# Patient Record
Sex: Male | Born: 1965 | Race: Black or African American | Hispanic: No | Marital: Married | Smoking: Never smoker
Health system: Southern US, Community
[De-identification: ages and names within clinical notes are randomized; demographics above are authoritative.]

## PROBLEM LIST (undated history)

## (undated) DIAGNOSIS — F32A Depression, unspecified: Secondary | ICD-10-CM

## (undated) DIAGNOSIS — K449 Diaphragmatic hernia without obstruction or gangrene: Secondary | ICD-10-CM

## (undated) DIAGNOSIS — Z8639 Personal history of other endocrine, nutritional and metabolic disease: Secondary | ICD-10-CM

## (undated) DIAGNOSIS — R399 Unspecified symptoms and signs involving the genitourinary system: Secondary | ICD-10-CM

## (undated) DIAGNOSIS — J45909 Unspecified asthma, uncomplicated: Secondary | ICD-10-CM

## (undated) DIAGNOSIS — F329 Major depressive disorder, single episode, unspecified: Secondary | ICD-10-CM

## (undated) DIAGNOSIS — Z8659 Personal history of other mental and behavioral disorders: Secondary | ICD-10-CM

## (undated) DIAGNOSIS — K219 Gastro-esophageal reflux disease without esophagitis: Secondary | ICD-10-CM

## (undated) DIAGNOSIS — F341 Dysthymic disorder: Secondary | ICD-10-CM

## (undated) DIAGNOSIS — M1990 Unspecified osteoarthritis, unspecified site: Secondary | ICD-10-CM

## (undated) DIAGNOSIS — E890 Postprocedural hypothyroidism: Secondary | ICD-10-CM

## (undated) DIAGNOSIS — M542 Cervicalgia: Secondary | ICD-10-CM

## (undated) DIAGNOSIS — N529 Male erectile dysfunction, unspecified: Secondary | ICD-10-CM

## (undated) DIAGNOSIS — D696 Thrombocytopenia, unspecified: Secondary | ICD-10-CM

## (undated) DIAGNOSIS — I7092 Chronic total occlusion of artery of the extremities: Secondary | ICD-10-CM

## (undated) DIAGNOSIS — R21 Rash and other nonspecific skin eruption: Secondary | ICD-10-CM

## (undated) DIAGNOSIS — J309 Allergic rhinitis, unspecified: Secondary | ICD-10-CM

## (undated) DIAGNOSIS — D6959 Other secondary thrombocytopenia: Secondary | ICD-10-CM

## (undated) DIAGNOSIS — N200 Calculus of kidney: Secondary | ICD-10-CM

## (undated) DIAGNOSIS — Z9189 Other specified personal risk factors, not elsewhere classified: Secondary | ICD-10-CM

## (undated) DIAGNOSIS — I252 Old myocardial infarction: Secondary | ICD-10-CM

## (undated) DIAGNOSIS — R112 Nausea with vomiting, unspecified: Secondary | ICD-10-CM

## (undated) DIAGNOSIS — Z87440 Personal history of urinary (tract) infections: Secondary | ICD-10-CM

## (undated) DIAGNOSIS — Z8619 Personal history of other infectious and parasitic diseases: Secondary | ICD-10-CM

## (undated) DIAGNOSIS — E119 Type 2 diabetes mellitus without complications: Secondary | ICD-10-CM

## (undated) DIAGNOSIS — G8929 Other chronic pain: Secondary | ICD-10-CM

## (undated) DIAGNOSIS — R6521 Severe sepsis with septic shock: Secondary | ICD-10-CM

## (undated) DIAGNOSIS — T451X5A Adverse effect of antineoplastic and immunosuppressive drugs, initial encounter: Secondary | ICD-10-CM

## (undated) DIAGNOSIS — Z9889 Other specified postprocedural states: Secondary | ICD-10-CM

## (undated) DIAGNOSIS — Z8719 Personal history of other diseases of the digestive system: Secondary | ICD-10-CM

## (undated) DIAGNOSIS — F419 Anxiety disorder, unspecified: Secondary | ICD-10-CM

## (undated) DIAGNOSIS — N201 Calculus of ureter: Secondary | ICD-10-CM

## (undated) DIAGNOSIS — R110 Nausea: Secondary | ICD-10-CM

## (undated) DIAGNOSIS — A419 Sepsis, unspecified organism: Secondary | ICD-10-CM

## (undated) DIAGNOSIS — M549 Dorsalgia, unspecified: Secondary | ICD-10-CM

## (undated) DIAGNOSIS — E785 Hyperlipidemia, unspecified: Secondary | ICD-10-CM

## (undated) DIAGNOSIS — Z794 Long term (current) use of insulin: Secondary | ICD-10-CM

## (undated) DIAGNOSIS — I739 Peripheral vascular disease, unspecified: Secondary | ICD-10-CM

## (undated) DIAGNOSIS — Z87442 Personal history of urinary calculi: Secondary | ICD-10-CM

## (undated) DIAGNOSIS — I10 Essential (primary) hypertension: Secondary | ICD-10-CM

## (undated) DIAGNOSIS — G479 Sleep disorder, unspecified: Secondary | ICD-10-CM

## (undated) HISTORY — PX: BACK SURGERY: SHX140

## (undated) HISTORY — DX: Postprocedural hypothyroidism: E89.0

## (undated) HISTORY — DX: Peripheral vascular disease, unspecified: I73.9

## (undated) HISTORY — DX: Sepsis, unspecified organism: A41.9

## (undated) HISTORY — PX: POPLITEAL ARTERY ANGIOPLASTY: SHX2242

## (undated) HISTORY — DX: Male erectile dysfunction, unspecified: N52.9

## (undated) HISTORY — DX: Unspecified asthma, uncomplicated: J45.909

## (undated) HISTORY — PX: POSTERIOR LUMBAR FUSION: SHX6036

## (undated) HISTORY — DX: Allergic rhinitis, unspecified: J30.9

## (undated) HISTORY — DX: Sleep disorder, unspecified: G47.9

## (undated) HISTORY — DX: Dysthymic disorder: F34.1

## (undated) HISTORY — DX: Severe sepsis with septic shock: R65.21

---
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
## 2004-03-08 ENCOUNTER — Ambulatory Visit (HOSPITAL_BASED_OUTPATIENT_CLINIC_OR_DEPARTMENT_OTHER): Admission: RE | Admit: 2004-03-08 | Discharge: 2004-03-08 | Payer: Self-pay | Admitting: Orthopedic Surgery

## 2004-03-08 HISTORY — PX: SHOULDER ARTHROSCOPY: SHX128

## 2009-03-04 ENCOUNTER — Encounter: Admission: RE | Admit: 2009-03-04 | Discharge: 2009-03-04 | Payer: Self-pay | Admitting: Neurosurgery

## 2009-03-26 ENCOUNTER — Ambulatory Visit (HOSPITAL_COMMUNITY): Admission: RE | Admit: 2009-03-26 | Discharge: 2009-03-27 | Payer: Self-pay | Admitting: Neurosurgery

## 2009-03-26 HISTORY — PX: LAMINECTOMY AND MICRODISCECTOMY LUMBAR SPINE: SHX1913

## 2009-06-12 ENCOUNTER — Encounter: Admission: RE | Admit: 2009-06-12 | Discharge: 2009-06-12 | Payer: Self-pay | Admitting: Neurosurgery

## 2009-11-11 ENCOUNTER — Inpatient Hospital Stay (HOSPITAL_COMMUNITY): Admission: RE | Admit: 2009-11-11 | Discharge: 2009-11-14 | Payer: Self-pay | Admitting: Neurosurgery

## 2010-02-18 ENCOUNTER — Ambulatory Visit (HOSPITAL_COMMUNITY): Admission: RE | Admit: 2010-02-18 | Discharge: 2010-02-18 | Payer: Self-pay | Admitting: Neurosurgery

## 2010-03-04 ENCOUNTER — Encounter: Payer: Self-pay | Admitting: Endocrinology

## 2010-03-16 ENCOUNTER — Ambulatory Visit: Payer: Self-pay | Admitting: Endocrinology

## 2010-03-16 DIAGNOSIS — R4182 Altered mental status, unspecified: Secondary | ICD-10-CM | POA: Insufficient documentation

## 2010-03-16 DIAGNOSIS — I10 Essential (primary) hypertension: Secondary | ICD-10-CM | POA: Insufficient documentation

## 2010-03-16 LAB — CONVERTED CEMR LAB
Free T4: 1.57 ng/dL (ref 0.60–1.60)
TSH: 0.04 microintl units/mL — ABNORMAL LOW (ref 0.35–5.50)

## 2010-03-25 ENCOUNTER — Telehealth: Payer: Self-pay | Admitting: Endocrinology

## 2010-04-19 ENCOUNTER — Ambulatory Visit: Payer: Self-pay | Admitting: Endocrinology

## 2010-04-19 LAB — CONVERTED CEMR LAB
Free T4: 1.06 ng/dL (ref 0.60–1.60)
TSH: 0.05 microintl units/mL — ABNORMAL LOW (ref 0.35–5.50)

## 2010-05-18 ENCOUNTER — Encounter: Admission: RE | Admit: 2010-05-18 | Payer: Self-pay | Source: Home / Self Care | Admitting: Neurosurgery

## 2010-06-10 ENCOUNTER — Encounter
Admission: RE | Admit: 2010-06-10 | Discharge: 2010-06-10 | Payer: Self-pay | Source: Home / Self Care | Attending: Neurosurgery | Admitting: Neurosurgery

## 2010-06-17 NOTE — Assessment & Plan Note (Signed)
Summary: NEW ENDO CONSULT / HYPOTHYROID/ [place] ACCESS/NWS  /#   Vital Signs:  Patient profile:   45 year old male Height:      70 inches (177.80 cm) Weight:      221.13 pounds (100.51 kg) BMI:     31.84 O2 Sat:      99 % on Room air Temp:     97.8 degrees F (36.56 degrees C) oral Pulse rate:   78 / minute BP sitting:   122 / 74  (left arm) Cuff size:   large  Vitals Entered By: Brenton Grills CMA Duncan Dull) (March 16, 2010 4:13 PM)  O2 Flow:  Room air CC: New Endo/Hypothyroid/aj Is Patient Diabetic? No   Referring Provider:  Brent Bulla MD Primary Provider:  Brent Bulla MD  CC:  New Endo/Hypothyroid/aj.  History of Present Illness: pt was hospitalized approx 3 weeks ago for confusion, and was found to be severely hyperthyroid.  he was rx'ed tapazole, and he feels better now.   he says in retrospect, he has 1 year of slight palpitaions in the chest, but no assoc sob.   Current Medications (verified): 1)  Lactulose 20 Gm/46ml Soln (Lactulose) .... 30 Cc's Two Times A Day 2)  Metoprolol Tartrate 100 Mg Tabs (Metoprolol Tartrate) .Marland Kitchen.. 1 Tablet By Mouth Two Times A Day 3)  Methimazole 5 Mg Tabs (Methimazole) .Marland Kitchen.. 1 Tablet By Mouth Three Times A Day 4)  Magnesium Oxide 400 Mg Tabs (Magnesium Oxide) .Marland Kitchen.. 1 By Mouth Once Daily 5)  Potassium Gluconate 595 Mg Tabs (Potassium Gluconate) .Marland Kitchen.. 1 Tablet By Mouth Once Daily 6)  Klonopin 1 Mg Tabs (Clonazepam) .Marland Kitchen.. 1 Tablet By Mouth Two Times A Day 7)  Omeprazole 40 Mg Cpdr (Omeprazole) .Marland Kitchen.. 1 By Mouth Once Daily 8)  Paxil 20 Mg Tabs (Paroxetine Hcl) .Marland Kitchen.. 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 Family History of Arthritis Family History Diabetes 1st degree relative (Grandparents) Family History Hypertension Family History of Heart Disease (Parents, Grandparents) mother has hypothyroidism  Social History: Reviewed history and no changes required. 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  he has lost 20 lbs x 1 year, but has since regained a few of these.  he has hoarseness.  he has a "sensation" in the throat.  he attributes diarrhea to the lactulose.  hje has a few myalgias.  he reports no change in chronic excessive diaphoresis.  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  he has chronic anxiety.   Physical Exam  General:  normal appearance.   Head:  head: no deformity eyes: no periorbital swelling.  there is slight bilateral proptosis  external nose and ears are normal mouth: no lesion seen Neck:  Supple without thyroid enlargement or tenderness. there is little or no thyromegaly. Lungs:  Clear to auscultation bilaterally. Normal respiratory effort.  Heart:  Regular rate and rhythm without murmurs or gallops noted. Normal S1,S2.   Abdomen:  abdomen  is soft, nontender.  no hepatosplenomegaly.   not distended.  no hernia.  Msk:  muscle bulk and strength are grossly normal.  no obvious joint swelling.  gait is normal and steady  Pulses:  dorsalis pedis intact bilat.  no carotid bruit  Extremities:  no deformity.  no ulcer on the feet.  feet are of normal color and temp.  no edema  Neurologic:  cn 2-12 grossly intact.   readily moves all 4's.   sensation is intact to touch on the feet  Skin:  normal texture and temp.  no rash.  not diaphoretic.  Cervical  Nodes:  No significant adenopathy.  Psych:  Alert and cooperative; normal mood and affect; normal attention span and concentration.   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 as it is not related to his thyroid problem.  Problem # 3:  throat "sensation" in view of his lack of a large goiter, this should be assumed to be of non-thyroidal origin.  Medications Added to Medication List This Visit: 1)  Lactulose 20 Gm/38ml Soln (Lactulose) .... 30 cc's two times a day 2)  Metoprolol Tartrate 100 Mg Tabs (Metoprolol tartrate) .Marland Kitchen.. 1 tablet by mouth two times a day 3)  Methimazole 5 Mg Tabs (Methimazole) .Marland Kitchen.. 1 tablet by mouth three times a day 4)  Magnesium Oxide 400 Mg Tabs (Magnesium oxide) .Marland Kitchen.. 1 by mouth once daily 5)  Potassium Gluconate 595 Mg Tabs (Potassium gluconate) .Marland Kitchen.. 1 tablet by mouth once daily 6)  Klonopin 1 Mg Tabs (Clonazepam) .Marland Kitchen.. 1 tablet by mouth two times a day 7)  Omeprazole 40 Mg Cpdr (Omeprazole) .Marland Kitchen.. 1 by mouth once daily 8)  Paxil 20 Mg Tabs (Paroxetine hcl) .Marland Kitchen.. 1 tablet by mouth once daily 9)  Methimazole 10 Mg Tabs (Methimazole) .Marland Kitchen.. 1 tab two times a day 10)  Metoprolol Tartrate 50 Mg Tabs (Metoprolol tartrate) .Marland Kitchen.. 1 tab two times a day  Other Orders: TLB-TSH (Thyroid Stimulating Hormone) (84443-TSH) TLB-T4 (Thyrox), Free [phone]) New Patient Level IV (29518)  Patient Instructions: 1)  blood tests are being ordered for you today.  please call [phone] to hear your test results. 2)  if ever you have fever while taking methimazole, stop it and call us, because of the risk  of a rare side-effect 3)  Please tentatively plan to return for a follow-up appointment in 1 month. 4)  (update:  i left message on phone tree.  increase tapazole to 10 mg two times a day.  reduce metoprolol to 50 mg bid). Prescriptions: METOPROLOL TARTRATE 50 MG TABS (METOPROLOL TARTRATE) 1 tab two times a day  #60 x 3   Entered and Authorized by:   Minus Breeding MD   Signed by:   Minus Breeding MD on 03/17/2010   Method used:   Electronically to  [facility]* (retail)       [address] [place], [place]  [id number]       Ph: [phone]       Fax: [phone]   RxID:   [id number] METHIMAZOLE 10 MG TABS (METHIMAZOLE) 1 tab two times a day  #60 x 2   Entered and Authorized by:   Minus Breeding MD   Signed by:   Minus Breeding MD on 03/17/2010   Method used:   Electronically to        [facility]* (retail)       [address] [place], [place]  [id number]       Ph: [phone]       Fax: [phone]   RxID:   [id number]    Orders Added: 1)  TLB-TSH (Thyroid Stimulating Hormone) [84443-TSH] 2)  TLB-T4 Elna Breslow), Free [[id number]] 3)  New Patient Level IV [phone]

## 2010-06-17 NOTE — Assessment & Plan Note (Signed)
Summary: 1 MO ROV /NWS  #   Vital Signs:  Patient profile:   45 year old male Height:      70 inches (177.80 cm) Weight:      231.25 pounds (105.11 kg) BMI:     33.30 O2 Sat:      98 % on Room air Temp:     98.0 degrees F (36.67 degrees C) oral Pulse rate:   84 / minute BP sitting:   122 / 78  (left arm) Cuff size:   large  Vitals Entered By: Brenton Grills CMA Duncan Dull) (April 19, 2010 7:59 AM)  O2 Flow:  Room air CC: Follow-up visit/aj Is Patient Diabetic? No   Referring Provider:  Brent Bulla MD Primary Provider:  Brent Bulla MD  CC:  Follow-up visit/aj.  History of Present Illness: pt says he takes meds as rx'ed.  palpitations are improved.  pt states he feels better in general.  Current Medications (verified): 1)  Lactulose 20 Gm/2ml Soln (Lactulose) .... 30 Cc's Two Times A Day 2)  Magnesium Oxide 400 Mg Tabs (Magnesium Oxide) .Marland Kitchen.. 1 By Mouth Once Daily 3)  Potassium Gluconate 595 Mg Tabs (Potassium Gluconate) .Marland Kitchen.. 1 Tablet By Mouth Once Daily 4)  Klonopin 1 Mg Tabs (Clonazepam) .Marland Kitchen.. 1 Tablet By Mouth Two Times A Day 5)  Omeprazole 40 Mg Cpdr (Omeprazole) .Marland Kitchen.. 1 By Mouth Once Daily 6)  Paxil 20 Mg Tabs (Paroxetine Hcl) .Marland Kitchen.. 1 Tablet By Mouth Once Daily 7)  Methimazole 10 Mg Tabs (Methimazole) .Marland Kitchen.. 1 Tab Two Times A Day 8)  Metoprolol Tartrate 100 Mg Tabs (Metoprolol Tartrate) .Marland Kitchen.. 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    Physical Exam  General:  normal appearance.   Neck:  there is little or no thyromegaly. Lungs:  Clear to auscultation bilaterally. Normal respiratory effort.  Heart:  Regular rate and rhythm without murmurs or gallops  noted. Normal S1,S2.   Additional Exam:  FastTSH              [L]  0.05 uIU/mL                 0.35-5.50 Free T4                   1.06 ng/dL       Impression & Recommendations:  Problem # 1:  HYPERTHYROIDISM (ICD-242.90) Assessment Improved  Other Orders: TLB-TSH (Thyroid Stimulating Hormone) (84443-TSH) TLB-T4 (Thyrox), Free [phone]) Est. Patient Level III (44010)  Patient Instructions: 1)  blood tests are being ordered for you today.  please call [phone] to hear your test results. 2)  Please schedule a follow-up appointment in 2 months. 3)  (update: i left message on phone-tree:  same rx).   Orders Added: 1)  TLB-TSH (Thyroid Stimulating Hormone) [84443-TSH] 2)  TLB-T4 (Thyrox), Free [[id number]] 3)  Est. Patient Level III [95638]

## 2010-06-17 NOTE — Letter (Signed)
Summary: [facility]   Imported By: Lennie Odor 03/18/2010 14:02:45  _____________________________________________________________________  External Attachment:    Type:   Image     Comment:   External Document

## 2010-06-17 NOTE — Progress Notes (Signed)
Summary: increased HR  Phone Note Call from Patient   Caller: Patient [phone] Summary of Call: Pt's spouse called stating that since pt has change medications he has been experiencing increased HR 90's, Pt is requesting to go back to original medciation and dosage, please advise Initial call taken by: Margaret Pyle, CMA,  March 25, 2010 4:14 PM  Follow-up for Phone Call        ok i sent rx Follow-up by: Minus Breeding MD,  March 25, 2010 4:34 PM  Additional Follow-up for Phone Call Additional follow up Details #1::        Pt informed  Additional Follow-up by: Lamar Sprinkles, CMA,  March 25, 2010 5:48 PM    New/Updated Medications: METOPROLOL TARTRATE 100 MG TABS (METOPROLOL TARTRATE) 1 tab two times a day Prescriptions: METOPROLOL TARTRATE 100 MG TABS (METOPROLOL TARTRATE) 1 tab two times a day  #60 x 1   Entered and Authorized by:   Minus Breeding MD   Signed by:   Minus Breeding MD on 03/25/2010   Method used:   Electronically to        [facility]* (retail)       [address], [place]  [id number]       Ph: [phone]       Fax: [phone]   RxID:   [id number]

## 2010-06-18 ENCOUNTER — Other Ambulatory Visit: Payer: Self-pay | Admitting: Endocrinology

## 2010-06-18 ENCOUNTER — Ambulatory Visit (INDEPENDENT_AMBULATORY_CARE_PROVIDER_SITE_OTHER): Payer: Medicaid Other | Admitting: Endocrinology

## 2010-06-18 ENCOUNTER — Encounter: Payer: Self-pay | Admitting: Endocrinology

## 2010-06-18 ENCOUNTER — Encounter (INDEPENDENT_AMBULATORY_CARE_PROVIDER_SITE_OTHER): Payer: Self-pay | Admitting: *Deleted

## 2010-06-18 ENCOUNTER — Other Ambulatory Visit: Payer: Medicaid Other

## 2010-06-18 ENCOUNTER — Ambulatory Visit: Admit: 2010-06-18 | Payer: Self-pay | Admitting: Endocrinology

## 2010-06-18 DIAGNOSIS — E0590 Thyrotoxicosis, unspecified without thyrotoxic crisis or storm: Secondary | ICD-10-CM

## 2010-06-18 LAB — T4, FREE: Free T4: 0.5 ng/dL — ABNORMAL LOW (ref 0.60–1.60)

## 2010-06-18 LAB — TSH: TSH: 0.03 u[IU]/mL — ABNORMAL LOW (ref 0.35–5.50)

## 2010-06-24 ENCOUNTER — Other Ambulatory Visit: Payer: Self-pay | Admitting: Endocrinology

## 2010-06-24 DIAGNOSIS — E0590 Thyrotoxicosis, unspecified without thyrotoxic crisis or storm: Secondary | ICD-10-CM

## 2010-06-29 ENCOUNTER — Telehealth (INDEPENDENT_AMBULATORY_CARE_PROVIDER_SITE_OTHER): Payer: Self-pay | Admitting: *Deleted

## 2010-07-01 NOTE — Assessment & Plan Note (Signed)
Summary: FU---STC   Vital Signs:  Patient profile:   45 year old male Height:      70 inches (177.80 cm) Weight:      239.13 pounds (108.70 kg) BMI:     34.44 O2 Sat:      97 % on Room air Temp:     98.1 degrees F (36.72 degrees C) oral Pulse rate:   63 / minute Pulse rhythm:   regular BP sitting:   120 / 82  (left arm) Cuff size:   large  Vitals Entered By: Brenton Grills CMA (AAMA) (June 18, 2010 9:20 AM)  O2 Flow:  Room air CC: 2 month F/U/aj Is Patient Diabetic? Yes   Referring Provider:  Brent Bulla MD Primary Provider:  Brent Bulla MD  CC:  2 month F/U/aj.  History of Present Illness: pt says he takes meds as rx'ed.  palpitations are improved.  pt states he feels well in general.  he now wants to pursue i-131 rx.  Current Medications (verified): 1)  Lactulose 20 Gm/84ml Soln (Lactulose) .... 30 Cc's Two Times A Day 2)  Magnesium Oxide 400 Mg Tabs (Magnesium Oxide) .Marland Kitchen.. 1 By Mouth Once Daily 3)  Potassium Gluconate 595 Mg Tabs (Potassium Gluconate) .Marland Kitchen.. 1 Tablet By Mouth Once Daily 4)  Klonopin 1 Mg Tabs (Clonazepam) .Marland Kitchen.. 1 Tablet By Mouth Two Times A Day 5)  Omeprazole 40 Mg Cpdr (Omeprazole) .Marland Kitchen.. 1 By Mouth Once Daily 6)  Paxil 20 Mg Tabs (Paroxetine Hcl) .Marland Kitchen.. 1 Tablet By Mouth Once Daily 7)  Methimazole 10 Mg Tabs (Methimazole) .Marland Kitchen.. 1 Tab Two Times A Day 8)  Metoprolol Tartrate 100 Mg Tabs (Metoprolol Tartrate) .Marland Kitchen.. 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    Physical Exam  General:  normal appearance.   Neck:  there is little or no thyromegaly. Neurologic:  no tremor Skin:  normal texture and temp.  no rash.  not  diaphoretic.  Additional Exam:  FastTSH              [L]  0.03 uIU/mL                 0.35-5.50 Free T4              [L]  0.50 ng/dL            Impression & Recommendations:  Problem # 1:  HYPERTHYROIDISM (ICD-242.90) pt now wants to pursue i-131 rx  Other Orders: i-131 Thyroid uptake and scan (Capsule & Scan) Nuc Med Diagnostic (thyroid uptake & sca) TLB-TSH (Thyroid Stimulating Hormone) (84443-TSH) TLB-T4 (Thyrox), Free ([id number]-UE4V) Est. Patient Level III (40981)  Patient Instructions: 1)  blood tests are being ordered for you today.  please call [phone] to hear your test results. 2)  pending the test results, please stop the methimazole. 3)  the tentative plan would be to do a scan, followed by radioactrive iodine therapy, then resume methimazole 3 days later, then return here 1 month after that.   4)  continue the same metoprolol for now.  5)  (update: i left message on phone-tree:  rx as we discussed)   Orders Added: 1)  i-131 Thyroid uptake and scan (Capsule & Scan) Nuc Med Diagnostic [thyroid uptake &  sca] 2)  TLB-TSH (Thyroid Stimulating Hormone) [84443-TSH] 3)  TLB-T4 (Thyrox), Free [[id number]] 4)  Est. Patient Level III [44010]

## 2010-07-07 NOTE — Progress Notes (Signed)
  Phone Note Other Incoming   Request: Send information Summary of Call: Request for records received from [facility]. Request forwarded to [employer].  02/13/2010 to present.

## 2010-07-12 ENCOUNTER — Encounter (HOSPITAL_COMMUNITY)
Admission: RE | Admit: 2010-07-12 | Discharge: 2010-07-12 | Disposition: A | Discharge status: Home / Self Care | Payer: Medicaid Other | Source: Ambulatory Visit | Attending: Endocrinology | Admitting: Endocrinology

## 2010-07-12 DIAGNOSIS — E0590 Thyrotoxicosis, unspecified without thyrotoxic crisis or storm: Secondary | ICD-10-CM

## 2010-07-12 DIAGNOSIS — I10 Essential (primary) hypertension: Secondary | ICD-10-CM | POA: Insufficient documentation

## 2010-07-12 DIAGNOSIS — E0500 Thyrotoxicosis with diffuse goiter without thyrotoxic crisis or storm: Secondary | ICD-10-CM | POA: Insufficient documentation

## 2010-07-13 ENCOUNTER — Ambulatory Visit (HOSPITAL_COMMUNITY)
Admission: RE | Admit: 2010-07-13 | Discharge: 2010-07-13 | Disposition: A | Discharge status: Home / Self Care | Payer: Medicaid Other | Source: Ambulatory Visit | Attending: Endocrinology | Admitting: Endocrinology

## 2010-07-13 DIAGNOSIS — E0590 Thyrotoxicosis, unspecified without thyrotoxic crisis or storm: Secondary | ICD-10-CM | POA: Insufficient documentation

## 2010-07-13 MED ORDER — SODIUM IODIDE I 131 CAPSULE
5.0000 | Freq: Once | INTRAVENOUS | Status: AC | PRN
  Administered 2010-07-12: 5 via ORAL

## 2010-07-13 MED ORDER — SODIUM PERTECHNETATE TC 99M INJECTION
10.0000 | Freq: Once | INTRAVENOUS | Status: AC | PRN
  Administered 2010-07-13: 10 via INTRAVENOUS

## 2010-07-14 ENCOUNTER — Telehealth: Payer: Self-pay | Admitting: Endocrinology

## 2010-07-14 ENCOUNTER — Other Ambulatory Visit: Payer: Self-pay | Admitting: Endocrinology

## 2010-07-14 DIAGNOSIS — E079 Disorder of thyroid, unspecified: Secondary | ICD-10-CM

## 2010-07-16 ENCOUNTER — Encounter (HOSPITAL_COMMUNITY): Payer: Self-pay

## 2010-07-16 ENCOUNTER — Encounter (HOSPITAL_COMMUNITY)
Admission: RE | Admit: 2010-07-16 | Discharge: 2010-07-16 | Disposition: A | Discharge status: Home / Self Care | Payer: Medicaid Other | Source: Ambulatory Visit | Attending: Endocrinology | Admitting: Endocrinology

## 2010-07-16 DIAGNOSIS — E079 Disorder of thyroid, unspecified: Secondary | ICD-10-CM

## 2010-07-16 MED ORDER — SODIUM IODIDE I 131 CAPSULE
18.4000 | Freq: Once | INTRAVENOUS | Status: AC | PRN
  Administered 2010-07-16: 18.4 via ORAL

## 2010-07-19 ENCOUNTER — Other Ambulatory Visit (HOSPITAL_COMMUNITY): Payer: Self-pay | Admitting: Neurosurgery

## 2010-07-19 DIAGNOSIS — M542 Cervicalgia: Secondary | ICD-10-CM

## 2010-07-22 NOTE — Progress Notes (Signed)
Summary: Surgery?  Phone Note Call from Patient Call back at [facility] Phone [phone]   Caller: Patient Summary of Call: Pt called stating he is scheduled for I-131 treatment but he also needs to have neck (disc) surgery. Pt is requesting MD advise on long should he wait to have neck surgery after I-131 treatment? Initial call taken by: Margaret Pyle, CMA,  July 14, 2010 3:48 PM  Follow-up for Phone Call        if possible, you should wait until your thyroid level is back to normal.  the radioactive iodine would take several months to do this.  while the radioactive iodine is working, i can prescribe medication for you to normalize the blood tests in 1 month or so, if you wish. Follow-up by: Minus Breeding MD,  July 15, 2010 9:01 AM  Additional Follow-up for Phone Call Additional follow up Details #1::        Pt advised of above and will discuss Rx at follow up appt after procedure Additional Follow-up by: Margaret Pyle, CMA,  July 15, 2010 1:34 PM

## 2010-07-30 ENCOUNTER — Ambulatory Visit (HOSPITAL_COMMUNITY)
Admission: RE | Admit: 2010-07-30 | Discharge: 2010-07-30 | Disposition: A | Discharge status: Home / Self Care | Payer: Medicaid Other | Source: Ambulatory Visit | Attending: Neurosurgery | Admitting: Neurosurgery

## 2010-07-30 ENCOUNTER — Encounter: Payer: Self-pay | Admitting: Endocrinology

## 2010-07-30 ENCOUNTER — Ambulatory Visit (INDEPENDENT_AMBULATORY_CARE_PROVIDER_SITE_OTHER): Payer: Medicaid Other | Admitting: Endocrinology

## 2010-07-30 DIAGNOSIS — M542 Cervicalgia: Secondary | ICD-10-CM

## 2010-07-30 DIAGNOSIS — M4802 Spinal stenosis, cervical region: Secondary | ICD-10-CM | POA: Insufficient documentation

## 2010-07-30 DIAGNOSIS — E0590 Thyrotoxicosis, unspecified without thyrotoxic crisis or storm: Secondary | ICD-10-CM

## 2010-07-30 MED ORDER — IOHEXOL 300 MG/ML  SOLN
10.0000 mL | Freq: Once | INTRAMUSCULAR | Status: AC | PRN
  Administered 2010-07-30: 10 mL via INTRATHECAL

## 2010-08-01 LAB — CBC
HCT: 36.2 % — ABNORMAL LOW (ref 39.0–52.0)
HCT: 44.8 % (ref 39.0–52.0)
Hemoglobin: 12 g/dL — ABNORMAL LOW (ref 13.0–17.0)
Hemoglobin: 14.8 g/dL (ref 13.0–17.0)
MCH: 27.1 pg (ref 26.0–34.0)
MCH: 27.1 pg (ref 26.0–34.0)
MCHC: 33 g/dL (ref 30.0–36.0)
MCHC: 33.1 g/dL (ref 30.0–36.0)
MCV: 81.9 fL (ref 78.0–100.0)
MCV: 81.9 fL (ref 78.0–100.0)
Platelets: 131 10*3/uL — ABNORMAL LOW (ref 150–400)
Platelets: 171 10*3/uL (ref 150–400)
RBC: 4.42 MIL/uL (ref 4.22–5.81)
RBC: 5.47 MIL/uL (ref 4.22–5.81)
RDW: 12.8 % (ref 11.5–15.5)
RDW: 12.9 % (ref 11.5–15.5)
WBC: 12.1 10*3/uL — ABNORMAL HIGH (ref 4.0–10.5)
WBC: 7.8 10*3/uL (ref 4.0–10.5)

## 2010-08-01 LAB — SURGICAL PCR SCREEN
MRSA, PCR: NEGATIVE
Staphylococcus aureus: NEGATIVE

## 2010-08-01 LAB — BASIC METABOLIC PANEL
BUN: 11 mg/dL (ref 6–23)
BUN: 9 mg/dL (ref 6–23)
CO2: 24 mEq/L (ref 19–32)
CO2: 29 mEq/L (ref 19–32)
Calcium: 8.6 mg/dL (ref 8.4–10.5)
Calcium: 9.3 mg/dL (ref 8.4–10.5)
Chloride: 106 mEq/L (ref 96–112)
Chloride: 99 mEq/L (ref 96–112)
Creatinine, Ser: 0.74 mg/dL (ref 0.4–1.5)
Creatinine, Ser: 0.96 mg/dL (ref 0.4–1.5)
GFR calc Af Amer: >60 mL/min (ref >60)
GFR calc Af Amer: >60 mL/min (ref >60)
GFR calc non Af Amer: >60 mL/min (ref >60)
GFR calc non Af Amer: >60 mL/min (ref >60)
Glucose, Bld: 103 mg/dL — ABNORMAL HIGH (ref 70–99)
Glucose, Bld: 123 mg/dL — ABNORMAL HIGH (ref 70–99)
Potassium: 4.1 mEq/L (ref 3.5–5.1)
Potassium: 4.3 mEq/L (ref 3.5–5.1)
Sodium: 134 mEq/L — ABNORMAL LOW (ref 135–145)
Sodium: 138 mEq/L (ref 135–145)

## 2010-08-01 LAB — CARDIAC PANEL(CRET KIN+CKTOT+MB+TROPI)
CK, MB: 0.8 ng/mL (ref 0.3–4.0)
CK, MB: 1 ng/mL (ref 0.3–4.0)
CK, MB: 1.1 ng/mL (ref 0.3–4.0)
Relative Index: 0.1 (ref 0.0–2.5)
Relative Index: 0.1 (ref 0.0–2.5)
Relative Index: 0.1 (ref 0.0–2.5)
Total CK: 808 U/L — ABNORMAL HIGH (ref 7–232)
Total CK: 1024 U/L — ABNORMAL HIGH (ref 7–232)
Total CK: 1065 U/L — ABNORMAL HIGH (ref 7–232)
Troponin I: 0.06 ng/mL (ref 0.00–0.06)
Troponin I: 0.05 ng/mL (ref 0.00–0.06)
Troponin I: 0.05 ng/mL (ref 0.00–0.06)

## 2010-08-01 LAB — TYPE AND SCREEN
ABO/RH(D): O POS
Antibody Screen: NEGATIVE

## 2010-08-01 LAB — ABO/RH: ABO/RH(D): O POS

## 2010-08-02 ENCOUNTER — Ambulatory Visit: Payer: Medicaid Other | Attending: Neurosurgery | Admitting: Physical Therapy

## 2010-08-03 NOTE — Assessment & Plan Note (Signed)
Summary: THROAT IS SORE AFTER IODINE AND DIZZY /NWS   Vital Signs:  Patient profile:   45 year old male Height:      70 inches (177.80 cm) Weight:      236.13 pounds (107.33 kg) BMI:     34.00 O2 Sat:      99 % on Room air Temp:     97.6 degrees F (36.44 degrees C) oral Pulse rate:   95 / minute BP sitting:   106 / 70  (left arm) Cuff size:   large  Vitals Entered By: Brenton Grills CMA Duncan Dull) (July 30, 2010 11:20 AM)  O2 Flow:  Room air CC: Sore throat after iodine tx and dizziness x 1 week/aj Is Patient Diabetic? No   Referring Provider:  Brent Bulla MD Primary Provider:  Brent Bulla MD  CC:  Sore throat after iodine tx and dizziness x 1 week/aj.  History of Present Illness: pt had i-131 rx for hyperthyroidism, approx 3 weeks ago.  scan was inconclusive between grave's dz and multinodular goiter.  he now has 1 week of heat intolerance, sore throat, and dizziness.  Current Medications (verified): 1)  Lactulose 20 Gm/74ml Soln (Lactulose) .... 30 Cc's Two Times A Day 2)  Magnesium Oxide 400 Mg Tabs (Magnesium Oxide) .Marland Kitchen.. 1 By Mouth Once Daily 3)  Potassium Gluconate 595 Mg Tabs (Potassium Gluconate) .Marland Kitchen.. 1 Tablet By Mouth Once Daily 4)  Klonopin 1 Mg Tabs (Clonazepam) .Marland Kitchen.. 1 Tablet By Mouth Two Times A Day 5)  Omeprazole 40 Mg Cpdr (Omeprazole) .Marland Kitchen.. 1 By Mouth Once Daily 6)  Paxil 20 Mg Tabs (Paroxetine Hcl) .Marland Kitchen.. 1 Tablet By Mouth Once Daily 7)  Metoprolol Tartrate 100 Mg Tabs (Metoprolol Tartrate) .Marland Kitchen.. 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 Family History of Arthritis Family History Diabetes 1st degree relative  (Grandparents) Family History Hypertension Family History of Heart Disease (Parents, Grandparents) mother has hypothyroidism  Social History: Reviewed history from 03/16/2010 and no changes required. Married Never Smoked Alcohol use-yes Regular exercise-no disabled due to back problem  Review of Systems  The patient denies syncope and prolonged cough.         fever is resolved.  no n/v/d  Physical Exam  General:  normal appearance.   Neck:  exam is limited by low-lying thyroid, but it is approx 3x normal size, with a multinodlar texture.     Impression & Recommendations:  Problem # 1:  HYPERTHYROIDISM (ICD-242.90) some sxs may be attributable to this, but he may have viral illness also.  Other Orders: Est. Patient Level III (16109)  Patient Instructions: 1)  here is a refill of your clonazepam. 2)  you should rest until your symptoms are impoved 3)  please return as scheduled in 1 month. 4)  drink plenty of fluids. Prescriptions: KLONOPIN 1 MG TABS (CLONAZEPAM) 1 tablet by mouth two times a day  #60 x 1   Entered and Authorized by:   Minus Breeding MD   Signed by:   Minus Breeding MD on 07/30/2010   Method used:   Print then Give to Patient   RxID:   [id number]    Orders Added: 1)  Est. Patient Level III [95621]

## 2010-08-18 LAB — BASIC METABOLIC PANEL
BUN: 7 mg/dL (ref 6–23)
CO2: 29 mEq/L (ref 19–32)
Calcium: 9.1 mg/dL (ref 8.4–10.5)
Chloride: 103 mEq/L (ref 96–112)
Creatinine, Ser: 0.81 mg/dL (ref 0.4–1.5)
GFR calc Af Amer: >60 mL/min (ref >60)
GFR calc non Af Amer: >60 mL/min (ref >60)
Glucose, Bld: 125 mg/dL — ABNORMAL HIGH (ref 70–99)
Potassium: 4.3 mEq/L (ref 3.5–5.1)
Sodium: 139 mEq/L (ref 135–145)

## 2010-08-18 LAB — CBC
HCT: 45.4 % (ref 39.0–52.0)
Hemoglobin: 15.2 g/dL (ref 13.0–17.0)
MCHC: 33.4 g/dL (ref 30.0–36.0)
MCV: 82.5 fL (ref 78.0–100.0)
Platelets: 153 10*3/uL (ref 150–400)
RBC: 5.51 MIL/uL (ref 4.22–5.81)
RDW: 12.9 % (ref 11.5–15.5)
WBC: 7.3 10*3/uL (ref 4.0–10.5)

## 2010-08-30 ENCOUNTER — Other Ambulatory Visit (INDEPENDENT_AMBULATORY_CARE_PROVIDER_SITE_OTHER): Payer: Medicaid Other

## 2010-08-30 ENCOUNTER — Ambulatory Visit (INDEPENDENT_AMBULATORY_CARE_PROVIDER_SITE_OTHER): Payer: Medicaid Other | Admitting: Endocrinology

## 2010-08-30 ENCOUNTER — Encounter: Payer: Self-pay | Admitting: Endocrinology

## 2010-08-30 VITALS — BP 128/82 | HR 66 | Temp 97.7°F | Ht 70.0 in | Wt 241.4 lb

## 2010-08-30 DIAGNOSIS — E0590 Thyrotoxicosis, unspecified without thyrotoxic crisis or storm: Secondary | ICD-10-CM

## 2010-08-30 DIAGNOSIS — R252 Cramp and spasm: Secondary | ICD-10-CM

## 2010-08-30 LAB — BASIC METABOLIC PANEL
BUN: 8 mg/dL (ref 6–23)
CO2: 30 mEq/L (ref 19–32)
Calcium: 8.9 mg/dL (ref 8.4–10.5)
Chloride: 102 mEq/L (ref 96–112)
Creatinine, Ser: 1.2 mg/dL (ref 0.4–1.5)
GFR: 88.96 mL/min (ref >60.00)
Glucose, Bld: 81 mg/dL (ref 70–99)
Potassium: 4.6 mEq/L (ref 3.5–5.1)
Sodium: 139 mEq/L (ref 135–145)

## 2010-08-30 LAB — T4, FREE: Free T4: 0.27 ng/dL — ABNORMAL LOW (ref 0.60–1.60)

## 2010-08-30 LAB — TSH: TSH: 0.27 u[IU]/mL — ABNORMAL LOW (ref 0.35–5.50)

## 2010-08-30 MED ORDER — LEVOTHYROXINE SODIUM 50 MCG PO TABS: 50.0000 ug | ORAL_TABLET | Freq: Every day | ORAL | Status: DC

## 2010-08-30 NOTE — Progress Notes (Signed)
  Subjective:    Patient ID: Jeff [name], male    DOB: 1966/05/09, 45 y.o.   MRN: [id number]  HPI Pt is now 6 weeks s/p i-131 rx for hyperthyroidism due to grave's dz vs multinodular goiter vs both.  pt states he feels well in general, except for a few mos of some intermittent mild cramps in the legs, and assoc mild dizziness.   Past Medical History  Diagnosis Date  . Thyroid dysfunction   . HYPERTENSION 03/16/2010  . HYPERTHYROIDISM 03/16/2010  . THYROID STORM 03/16/2010  . Altered mental status 03/16/2010   Past Surgical History  Procedure Date  . Back surgery 03/2009, 10/2009    reports that he has never smoked. He does not have any smokeless tobacco history on file. He reports that he drinks alcohol. His drug history not on file. family history includes Arthritis in his other; Diabetes in his other; Heart disease in his other; Hypertension in his other; and Thyroid disease in his mother. No Known Allergies  Review of Systems Denies fever.  He still has slight tremor of the hands.    Objective:   Physical Exam GENERAL: no distress Neck:  i do not appreciate a goiter. Neurol:  Little if any tremor is noted.       Assessment & Plan:  Post-i-131 hypothyroidism.  new Muscle cramps.  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  He wants to have surgery when he can.

## 2010-08-30 NOTE — Patient Instructions (Addendum)
blood tests are being ordered for you today.  please call [phone] to hear your test results. pending the test results, please continue the same medications for now Please make a follow-up appointment in 1 month.  i'll clear you for the neck surgery as soon as i can. i left message on phone tree.  Start synthroid 50/d.  You are cleared for surgery, from a thyroid standpoint.

## 2010-09-01 ENCOUNTER — Ambulatory Visit (HOSPITAL_COMMUNITY)
Admission: RE | Admit: 2010-09-01 | Discharge: 2010-09-01 | Disposition: A | Discharge status: Home / Self Care | Payer: Medicaid Other | Source: Ambulatory Visit | Attending: Neurosurgery | Admitting: Neurosurgery

## 2010-09-01 ENCOUNTER — Encounter (HOSPITAL_COMMUNITY)
Admission: RE | Admit: 2010-09-01 | Discharge: 2010-09-01 | Disposition: A | Discharge status: Home / Self Care | Payer: Medicaid Other | Source: Ambulatory Visit | Attending: Neurosurgery | Admitting: Neurosurgery

## 2010-09-01 ENCOUNTER — Other Ambulatory Visit (HOSPITAL_COMMUNITY): Payer: Self-pay | Admitting: Neurosurgery

## 2010-09-01 DIAGNOSIS — Z01818 Encounter for other preprocedural examination: Secondary | ICD-10-CM | POA: Insufficient documentation

## 2010-09-01 DIAGNOSIS — Z01810 Encounter for preprocedural cardiovascular examination: Secondary | ICD-10-CM | POA: Insufficient documentation

## 2010-09-01 DIAGNOSIS — Z01812 Encounter for preprocedural laboratory examination: Secondary | ICD-10-CM | POA: Insufficient documentation

## 2010-09-01 DIAGNOSIS — M479 Spondylosis, unspecified: Secondary | ICD-10-CM

## 2010-09-01 LAB — BASIC METABOLIC PANEL
BUN: 8 mg/dL (ref 6–23)
CO2: 27 mEq/L (ref 19–32)
Calcium: 8.7 mg/dL (ref 8.4–10.5)
Chloride: 104 mEq/L (ref 96–112)
Creatinine, Ser: 1.11 mg/dL (ref 0.4–1.5)
GFR calc Af Amer: >60 mL/min (ref >60)
GFR calc non Af Amer: >60 mL/min (ref >60)
Glucose, Bld: 105 mg/dL — ABNORMAL HIGH (ref 70–99)
Potassium: 4.5 mEq/L (ref 3.5–5.1)
Sodium: 135 mEq/L (ref 135–145)

## 2010-09-01 LAB — CBC
HCT: 47.2 % (ref 39.0–52.0)
Hemoglobin: 15.2 g/dL (ref 13.0–17.0)
MCH: 27.2 pg (ref 26.0–34.0)
MCHC: 32.2 g/dL (ref 30.0–36.0)
MCV: 84.4 fL (ref 78.0–100.0)
Platelets: 161 10*3/uL (ref 150–400)
RBC: 5.59 MIL/uL (ref 4.22–5.81)
RDW: 13.5 % (ref 11.5–15.5)
WBC: 7 10*3/uL (ref 4.0–10.5)

## 2010-09-01 LAB — SURGICAL PCR SCREEN
MRSA, PCR: NEGATIVE
Staphylococcus aureus: NEGATIVE

## 2010-09-06 ENCOUNTER — Other Ambulatory Visit: Payer: Self-pay | Admitting: Endocrinology

## 2010-09-08 ENCOUNTER — Inpatient Hospital Stay (HOSPITAL_COMMUNITY): Payer: Medicaid Other

## 2010-09-08 ENCOUNTER — Observation Stay (HOSPITAL_COMMUNITY)
Admission: RE | Admit: 2010-09-08 | Discharge: 2010-09-08 | DRG: 473 | Disposition: A | Discharge status: Home / Self Care | Payer: Medicaid Other | Source: Ambulatory Visit | Attending: Neurosurgery | Admitting: Neurosurgery

## 2010-09-08 DIAGNOSIS — E039 Hypothyroidism, unspecified: Secondary | ICD-10-CM | POA: Insufficient documentation

## 2010-09-08 DIAGNOSIS — M47812 Spondylosis without myelopathy or radiculopathy, cervical region: Secondary | ICD-10-CM | POA: Insufficient documentation

## 2010-09-08 DIAGNOSIS — M5020 Other cervical disc displacement, unspecified cervical region: Secondary | ICD-10-CM | POA: Insufficient documentation

## 2010-09-08 DIAGNOSIS — M5030 Other cervical disc degeneration, unspecified cervical region: Principal | ICD-10-CM | POA: Insufficient documentation

## 2010-09-08 DIAGNOSIS — K219 Gastro-esophageal reflux disease without esophagitis: Secondary | ICD-10-CM | POA: Insufficient documentation

## 2010-09-08 DIAGNOSIS — I10 Essential (primary) hypertension: Secondary | ICD-10-CM | POA: Insufficient documentation

## 2010-09-08 HISTORY — PX: ANTERIOR CERVICAL DECOMP/DISCECTOMY FUSION: SHX1161

## 2010-09-12 NOTE — Op Note (Signed)
NAME:  Jeff [name], Jeff [name] NO.:  [id number]  MEDICAL RECORD NO.:  [id number]           PATIENT TYPE:  I  LOCATION:  3526                         FACILITY:  [facility]  PHYSICIAN:  Cristi Loron, M.D.DATE OF BIRTH:  1966/04/22  DATE OF PROCEDURE:  09/08/2010 DATE OF DISCHARGE:  09/08/2010                              OPERATIVE REPORT   BRIEF HISTORY:  The patient is a 45 year old black male who has suffered from neck pain.  He has failed medical management.  He is worked up with a cervical MRI and mild CT, which demonstrated he had spondylosis, degeneration etc. at C3-4.  I discussed the various treatment options with the patient including surgery.  He has weighed the risks, benefits, and alternatives of the surgery and decided to proceed with a C3-4 anterior cervical diskectomy fusion and plating.  PREOPERATIVE DIAGNOSES:  C3-4 degenerative disk disease, herniated nucleus pulposus, spondylosis stenosis, cervicalgia, cervical radiculopathy.  POSTOPERATIVE DIAGNOSES:  C3-4 degenerative disk disease, herniated nucleus pulposus, spondylosis stenosis, cervicalgia, cervical radiculopathy.  PROCEDURE:  C3-4 extensive anterior cervical diskectomy/decompression; C3-4 anterior interbody arthrodesis with local morselized autograft bone and Actifuse bone graft extender; insertion of C3-4 interbody prosthesis ([employer] PEEK interbody prosthesis); anterior cervical plating with a Globus titanium plate and screws.  SURGEON:  Cristi Loron, MD  ASSISTANT:  Danae Orleans. Venetia Maxon, MD  ANESTHESIA:  General endotracheal.  ESTIMATED BLOOD LOSS:  50 mL.  SPECIMENS:  None.  DRAINS:  None.  COMPLICATIONS:  None.  DESCRIPTION OF PROCEDURE:  The patient was brought to the operating room by the Anesthesia team.  General endotracheal anesthesia was induced. The patient remained in the supine position.  A roll was placed under his shoulder to keep his neck in a neutral position.   His anterior cervical region was then prepared with Betadine scrub and Betadine solution.  Sterile drapes were applied.  I then injected the area to be incised with Marcaine with epinephrine solution.  A scalpel to make a transverse incision in the patient's left anterior neck.  I used a Metzenbaum scissors to divide the platysma muscle and then to dissect medial to sternocleidomastoid muscle, jugular vein and carotid artery. I carefully dissected down towards the anterior spine, identifying the esophagus and retracting it medially.  I then used the Kittner swabs to clear soft tissue from anterior cervical spine.  I inserted a bent spinal needle into the upper exposed intervertebral disk space.  We obtained intraoperative radiograph to confirm our location.  I then used electrocautery to detach the medial border of the longus colli muscle bilaterally from C3-4 intervertebral disk space.  I inserted the Caspar self-retaining retractor underneath the longus colli muscle bilaterally to provide exposure.  We began the decompression by incising the C3-4 intervertebral disk with a 15-blade scalpel.  We performed a partial intervertebral diskectomy using the pituitary forceps and Karlin curettes.  I then inserted the distraction screws into C4 and C4 and distracted the interspace.  I then used the high-speed drill to decorticate the vertebral endplates at C3-4 to drill away the remainder of C3-4 intervertebral disk, to drill away some posterior spondylosis and  to thinned out the posterior longitudinal ligament.  I then used the arachnoid knife to incise the ligament and then removed it with Kerrison punch undercutting the vertebral endplates at C3-4 decompressing the thecal sac.  I then performed foraminotomies about the bilateral C4 nerve roots completing the decompression at this level.  I now turned our attention to the arthrodesis.  I used trial spacers and determined to use a 7-mm  interbody prosthesis.  I prefilled the prosthesis with a combination of local autograft bone we obtained during the decompression as well as Actifuse bone graft extender.  I inserted the prosthesis and distracted at C3-4 interspaces and then removed the distraction screws.  There was a good snug fit of the prosthesis in the interspace.  We now turned our attention to the anterior spinal instrumentation.  We used high-speed drill to remove some ventral spondylosis from the C3-4 vertebral endplates, so that the plate would lie down flat.  We selected appropriate length Globus anterior cervical plate.  We laid it along the anterior aspect of the vertebral bodies at C3-4.  We then drilled two 40- mm holes at C3 to C4 and secured the plate to the vertebral bodies with two 40-mm self-tapping screws.  We got good bony purchase.  We then obtained intraoperative radiograph, which demonstrated good position of plate, screws, and bioprosthesis.  We therefore secured these screws and plate by locking each cam.  This completed the instrumentation.  We then obtained hemostasis using bipolar electrocautery.  We irrigated the wound out with bacitracin solution.  We then removed the retractor. We inspected esophagus for any damage, there was none apparent.  We then reapproximated the patient's platysma muscle with interrupted 3-0 Vicryl suture, subcutaneous tissue with interrupted 3-0 Vicryl suture, and the skin with Steri-Strips and benzoin.  The wound was then coated with bacitracin ointment.  A sterile dressing was applied.  The drapes were removed, and the patient was subsequently extubated by the Anesthesia team and transported to postanesthesia care unit in stable condition. All sponge, instrument, and needle counts were correct at the end of this case.     Cristi Loron, M.D.     JDJ/MEDQ  D:  09/08/2010  T:  09/09/2010  Job:  [id number]  Electronically Signed by Tressie Stalker M.D.  on 09/12/2010 11:07:27 AM

## 2010-09-29 ENCOUNTER — Ambulatory Visit (INDEPENDENT_AMBULATORY_CARE_PROVIDER_SITE_OTHER): Payer: Medicaid Other | Admitting: Endocrinology

## 2010-09-29 ENCOUNTER — Other Ambulatory Visit (INDEPENDENT_AMBULATORY_CARE_PROVIDER_SITE_OTHER): Payer: Medicaid Other

## 2010-09-29 ENCOUNTER — Encounter: Payer: Self-pay | Admitting: Endocrinology

## 2010-09-29 VITALS — BP 118/80 | HR 72 | Temp 98.5°F | Ht 70.0 in | Wt 245.0 lb

## 2010-09-29 DIAGNOSIS — E042 Nontoxic multinodular goiter: Secondary | ICD-10-CM | POA: Insufficient documentation

## 2010-09-29 LAB — TSH: TSH: 3.01 u[IU]/mL (ref 0.35–5.50)

## 2010-09-29 NOTE — Patient Instructions (Addendum)
blood tests are being ordered for you today.  please call [phone] to hear your test results.  You will be prompted to enter the 9-digit "MRN" number that appears at the top left of this page, followed by #.  Then you will hear the message. Please make a follow-up appointment in 6 weeks. Now that your thyroid is better, you no longer need clonazepam for it--good. Please ask "[employer]" to forward the disability exam form to a doctor who treats you for a condition which might cause disability. You are not disabled from the thyroid standpoint--good.    (update: i left message on phone-tree:  rx as we discussed)

## 2010-09-29 NOTE — Progress Notes (Signed)
  Subjective:    Patient ID: Jeff [name], male    DOB: 06-Feb-1966, 45 y.o.   MRN: [id number]  HPI Pt is now 2 1/2 months s/p i-131 rx for hyperthyroidism due to grave's dz vs multinodular goiter vs both.  He is now on synthroid 50/d.  pt states he feels well in general. Past Medical History  Diagnosis Date  . Thyroid dysfunction   . HYPERTENSION 03/16/2010  . HYPERTHYROIDISM 03/16/2010  . THYROID STORM 03/16/2010  . Altered mental status 03/16/2010    Past Surgical History  Procedure Date  . Back surgery 03/2009, 10/2009    History   Social History  . Marital Status: Married    Spouse Name: N/A    Number of Children: N/A  . Years of Education: N/A   Occupational History  . Disabled    Social History Main Topics  . Smoking status: Never Smoker   . Smokeless tobacco: Not on file  . Alcohol Use: Yes  . Drug Use:   . Sexually Active:    Other Topics Concern  . Not on file   Social History Narrative   Regular exercise-noDisabled to due back problem    Current Outpatient Prescriptions on File Prior to Visit  Medication Sig Dispense Refill  . Lactulose 20 GM/30ML SOLN Take by mouth. 30 cc's two times a day       . levothyroxine (SYNTHROID) 50 MCG tablet Take 1 tablet (50 mcg total) by mouth daily.  30 tablet  2  . magnesium oxide (MAG-OX) 400 MG tablet Take 400 mg by mouth daily.        . metoprolol (LOPRESSOR) 100 MG tablet TAKE 1 TABLET TWICE DAILY.  60 tablet  5  . omeprazole (PRILOSEC) 40 MG capsule Take 40 mg by mouth daily.        Marland Kitchen PARoxetine (PAXIL) 20 MG tablet Take 20 mg by mouth every morning.        . potassium gluconate 595 MG TABS Take 595 mg by mouth daily.         No Known Allergies  Family History  Problem Relation Age of Onset  . Thyroid disease Mother     hypothyroidism  . Arthritis Other   . Hypertension Other   . Heart disease Other     Parent, Grandparents  . Diabetes Other     Grandparents    BP 118/80  Pulse 72  Temp(Src) 98.5 F  (36.9 C) (Oral)  Ht 5\' 10"  (1.778 m)  Wt 245 lb (111.131 kg)  BMI 35.15 kg/m2  SpO2 99%     Review of Systems Denies weight change.      Objective:   Physical Exam GENERAL: no distress Neck: a healing scar is present (recent c-spine surgery).  i do not appreciate a nodule in the thyroid or elsewhere in the neck.     Lab Results  Component Value Date   TSH 3.01 09/29/2010     Assessment & Plan:  Hyperthyroidism, resolved with i-131 rx

## 2010-10-01 NOTE — Op Note (Signed)
NAMEBERNON, ARVISO NO.:  [id number]   MEDICAL RECORD NO.:  [id number]          PATIENT TYPE:  AMB   LOCATION:  DSC                          FACILITY:  [facility]   PHYSICIAN:  Feliberto Gottron. Turner Daniels, M.D.   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   OPERATION PERFORMED:  Right shoulder arthroscopic anterior inferior  acromioplasty, distal clavicle excision and debridement of labral tear.   SURGEON:  Feliberto Gottron. Turner Daniels, M.D.   ASSISTANTLaural Benes. Jannet Mantis.   ANESTHESIA:  General endotracheal.   ESTIMATED BLOOD LOSS:  Minimal.   FLUIDS REPLACED:  1500 mL of crystalloid.   DRAINS:  None.   TOURNIQUET TIME:  None.   INDICATIONS FOR PROCEDURE:  The patient is a 45 year old man injured at  work.  MRI proven rotator cuff tendonitis.  Got good temporary response to a  differential injection of the acromioclavicular joint, unfortunately, his  pain is returning.  He is now a candidate for an arthroscopic decompression  of the shoulder for a large type 2 subacromial spur as well as  acromioclavicular joint arthritis which was evident on the MRI scan.   DESCRIPTION OF PROCEDURE:  The patient was identified by arm band and taken  to the operating room at [facility] where appropriate  anesthetic monitors are attached and general endotracheal anesthesia induced  with the patient in the supine position.  The patient was placed in the  beach chair position.  The right upper extremity was prepped and draped in  the usual sterile fashion from the wrist to the hemithorax.  The skin along  the anterior, lateral and posterior aspects of  the acromion process was  infiltrated with 2 to 3 mL of 0.5% Marcaine with epinephrine solution into  each region and then using a #11 blade, standard portals were made 1.5 cm  anterior to the [facility] joint, lateral to the junction of the middle and  posterior thirds of the acromion and posterior to the posterolateral corner  of the acromion process.  The inflow was placed anteriorly, the arthroscope  laterally and a 4.2 Great [place] sucker shaver posteriorly as an Geophysicist/field seismologist  applied traction on the elbow opening the subacromial space which was then  debrided of inflamed bursa with a 4.2 Great [place] sucker shaver.  We  outlined the subacromial spur which was then removed with a 4.5 mm hooded  Vortex bur exposing the [facility] joint which was arthritic and the anterior distal  half of the clavicle removed at this point for about 1 cm.  We then swapped  portals bringing the arthroscope in posteriorly, the inflow laterally and  the vortex bur anteriorly completing our distal clavicle excision up to the  acromioclavicular ligament. Photographic documentation was made of the  distal clavicle  excision as well as the removal of the subacromial spur.  The arthroscope was then repositioned into glenohumeral joint using the  posterior portal.  We then identified a radial flap tear in the superior  labrum which was debrided back to a stable margin using the anterior portal  with a 4.2 Great [place] sucker shaver.  The inferior aspect of the rotator  cuff was intact.  Biceps tendon was intact.  Subscap was intact and the  articular cartilage has a little bit of grade 2  chondromalacia only at the  center of the glenoid and this was lightly debrided. At this point the  shoulder was irrigated out with normal saline solution.  The arthroscopic  instruments were removed and a dressing of Xeroform, 4 x 4 dressing sponges,  HypaFix tape and a sling applied.  Patient was laid supine, awakened and  taken to the recovery room  without difficulty.      Emilio Aspen  D:  03/08/2004  T:  03/08/2004  Job:  [id number]

## 2010-10-04 ENCOUNTER — Telehealth: Payer: Self-pay

## 2010-10-04 NOTE — Telephone Encounter (Signed)
Message copied by Margaret Pyle on Mon Oct 04, 2010  2:28 PM ------      Message from: Romero Belling      Created: Sat Oct 02, 2010  2:43 PM       Please fax note, and labs, if any, to pcp.  Thank you.

## 2010-10-04 NOTE — Telephone Encounter (Signed)
done

## 2010-11-10 ENCOUNTER — Other Ambulatory Visit (INDEPENDENT_AMBULATORY_CARE_PROVIDER_SITE_OTHER): Payer: Medicaid Other

## 2010-11-10 ENCOUNTER — Encounter: Payer: Self-pay | Admitting: Endocrinology

## 2010-11-10 ENCOUNTER — Ambulatory Visit (INDEPENDENT_AMBULATORY_CARE_PROVIDER_SITE_OTHER): Payer: Medicaid Other | Admitting: Endocrinology

## 2010-11-10 VITALS — BP 126/84 | HR 71 | Temp 97.9°F | Ht 70.0 in | Wt 251.6 lb

## 2010-11-10 DIAGNOSIS — E0590 Thyrotoxicosis, unspecified without thyrotoxic crisis or storm: Secondary | ICD-10-CM

## 2010-11-10 LAB — TSH: TSH: 9.25 u[IU]/mL — ABNORMAL HIGH (ref 0.35–5.50)

## 2010-11-10 MED ORDER — LEVOTHYROXINE SODIUM 75 MCG PO TABS: 75.0000 ug | ORAL_TABLET | Freq: Every day | ORAL | Status: DC

## 2010-11-10 NOTE — Progress Notes (Signed)
  Subjective:    Patient ID: Jeff [name], male    DOB: April 11, 1966, 45 y.o.   MRN: [id number]  HPI Pt is now almost 4 months s/p i-131 rx for hyperthyroidism due to grave's dz vs multinodular goiter vs both. He is now on synthroid 50/d. pt states he feels well in general.  Past Medical History  Diagnosis Date  . Thyroid dysfunction   . HYPERTENSION 03/16/2010  . HYPERTHYROIDISM 03/16/2010  . THYROID STORM 03/16/2010  . Altered mental status 03/16/2010    Past Surgical History  Procedure Date  . Back surgery 03/2009, 10/2009    History   Social History  . Marital Status: Married    Spouse Name: N/A    Number of Children: N/A  . Years of Education: N/A   Occupational History  . Disabled    Social History Main Topics  . Smoking status: Never Smoker   . Smokeless tobacco: Not on file  . Alcohol Use: Yes  . Drug Use:   . Sexually Active:    Other Topics Concern  . Not on file   Social History Narrative   Regular exercise-noDisabled to due back problem    Current Outpatient Prescriptions on File Prior to Visit  Medication Sig Dispense Refill  . Lactulose 20 GM/30ML SOLN Take by mouth. 30 cc's two times a day       . magnesium oxide (MAG-OX) 400 MG tablet Take 400 mg by mouth daily.        . metoprolol (LOPRESSOR) 100 MG tablet TAKE 1 TABLET TWICE DAILY.  60 tablet  5  . omeprazole (PRILOSEC) 40 MG capsule Take 40 mg by mouth daily.        Marland Kitchen PARoxetine (PAXIL) 20 MG tablet Take 20 mg by mouth every morning.        . potassium gluconate 595 MG TABS Take 595 mg by mouth daily.       Marland Kitchen DISCONTD: levothyroxine (SYNTHROID) 50 MCG tablet Take 1 tablet (50 mcg total) by mouth daily.  30 tablet  2    No Known Allergies  Family History  Problem Relation Age of Onset  . Thyroid disease Mother     hypothyroidism  . Arthritis Other   . Hypertension Other   . Heart disease Other     Parent, Grandparents  . Diabetes Other     Grandparents    BP 126/84  Pulse 71   Temp(Src) 97.9 F (36.6 C) (Oral)  Ht 5\' 10"  (1.778 m)  Wt 251 lb 9.6 oz (114.125 kg)  BMI 36.10 kg/m2  SpO2 97%  Review of Systems Denies weight change    Objective:   Physical Exam GENERAL: no distress Neck: i do not appreciate a nodule in the thyroid or elsewhere in the neck     Lab Results  Component Value Date   TSH 9.25* 11/10/2010   Assessment & Plan:  Multinodular goiter, improved with i-131 rx Post-i-131 hypothyroidism, needs increased rx

## 2010-11-10 NOTE — Patient Instructions (Addendum)
blood tests are being ordered for you today.  please call [phone] to hear your test results.  You will be prompted to enter the 9-digit "MRN" number that appears at the top left of this page, followed by #.  Then you will hear the message. pending the test results, please continue the same levothyroxine for now Please make a follow-up appointment in September.  (update: i left message on phone-tree:  Increase synthroid to 75/d).

## 2011-02-09 ENCOUNTER — Encounter: Payer: Self-pay | Admitting: Endocrinology

## 2011-02-09 ENCOUNTER — Other Ambulatory Visit (INDEPENDENT_AMBULATORY_CARE_PROVIDER_SITE_OTHER): Payer: Medicaid Other

## 2011-02-09 ENCOUNTER — Ambulatory Visit (INDEPENDENT_AMBULATORY_CARE_PROVIDER_SITE_OTHER): Payer: Medicaid Other | Admitting: Endocrinology

## 2011-02-09 VITALS — BP 128/80 | HR 73 | Temp 97.9°F | Ht 70.0 in | Wt 244.4 lb

## 2011-02-09 DIAGNOSIS — E0590 Thyrotoxicosis, unspecified without thyrotoxic crisis or storm: Secondary | ICD-10-CM

## 2011-02-09 LAB — TSH: TSH: 3.31 u[IU]/mL (ref 0.35–5.50)

## 2011-02-09 MED ORDER — LEVOTHYROXINE SODIUM 75 MCG PO TABS: 75.0000 ug | ORAL_TABLET | Freq: Every day | ORAL | Status: DC

## 2011-02-09 NOTE — Progress Notes (Signed)
  Subjective:    Patient ID: Jeff [name], male    DOB: [birth year], 45 y.o.   MRN: [id number]  HPI Pt is now almost 7 months s/p i-131 rx for hyperthyroidism due to grave's dz vs multinodular goiter vs both. He is now on synthroid 75/d. pt reports ongoing anxiety, headache, and insomnia.  He has lost 7 lbs since last ov.  Past Medical History  Diagnosis Date  . Thyroid dysfunction   . HYPERTENSION 03/16/2010  . HYPERTHYROIDISM 03/16/2010  . THYROID STORM 03/16/2010  . Altered mental status 03/16/2010    Past Surgical History  Procedure Date  . Back surgery 03/2009, 10/2009    History   Social History  . Marital Status: Married    Spouse Name: N/A    Number of Children: N/A  . Years of Education: N/A   Occupational History  . Disabled    Social History Main Topics  . Smoking status: Never Smoker   . Smokeless tobacco: Not on file  . Alcohol Use: Yes  . Drug Use:   . Sexually Active:    Other Topics Concern  . Not on file   Social History Narrative   Regular exercise-noDisabled to due back problem    Current Outpatient Prescriptions on File Prior to Visit  Medication Sig Dispense Refill  . levothyroxine (SYNTHROID, LEVOTHROID) 75 MCG tablet Take 1 tablet (75 mcg total) by mouth daily.  30 tablet  3  . magnesium oxide (MAG-OX) 400 MG tablet Take 400 mg by mouth daily.        . metoprolol (LOPRESSOR) 100 MG tablet TAKE 1 TABLET TWICE DAILY.  60 tablet  5  . omeprazole (PRILOSEC) 40 MG capsule Take 40 mg by mouth daily.        Marland Kitchen PARoxetine (PAXIL) 20 MG tablet Take 20 mg by mouth every morning.        . potassium gluconate 595 MG TABS Take 595 mg by mouth daily.       . Lactulose 20 GM/30ML SOLN Take by mouth. 30 cc's two times a day         No Known Allergies  Family History  Problem Relation Age of Onset  . Thyroid disease Mother     hypothyroidism  . Arthritis Other   . Hypertension Other   . Heart disease Other     Parent, Grandparents  . Diabetes Other      Grandparents   BP 128/80  Pulse 73  Temp(Src) 97.9 F (36.6 C) (Oral)  Ht 5\' 10"  (1.778 m)  Wt 244 lb 6.4 oz (110.859 kg)  BMI 35.07 kg/m2  SpO2 99%  Review of Systems Denies fever    Objective:   Physical Exam VITAL SIGNS:  See vs page GENERAL: no distress There is no palpable thyroid enlargement.  No thyroid nodule is palpable.  No palpable lymphadenopathy at the anterior neck. Neuro: no tremor Skin: not diaphoretic   Lab Results  Component Value Date   TSH 3.31 02/09/2011      Assessment & Plan:  Post-i-131 hypothyroidism, well-replaced

## 2011-02-09 NOTE — Patient Instructions (Addendum)
blood tests are being ordered for you today.  please call [phone] to hear your test results.  You will be prompted to enter the 9-digit "MRN" number that appears at the top left of this page, followed by #.  Then you will hear the message.  Please make a follow-up appointment in January. (update: i left message on phone-tree:  Same rx).

## 2011-03-10 ENCOUNTER — Other Ambulatory Visit: Payer: Self-pay | Admitting: *Deleted

## 2011-03-10 MED ORDER — LEVOTHYROXINE SODIUM 75 MCG PO TABS: 75.0000 ug | ORAL_TABLET | Freq: Every day | ORAL | Status: DC

## 2011-03-10 NOTE — Telephone Encounter (Signed)
R'cd fax from [facility] for refill of pt's Levothyroxine  Last OV-02/09/2011

## 2011-03-16 ENCOUNTER — Emergency Department (HOSPITAL_COMMUNITY): Payer: Medicaid Other

## 2011-03-16 ENCOUNTER — Emergency Department (HOSPITAL_COMMUNITY)
Admission: EM | Admit: 2011-03-16 | Discharge: 2011-03-16 | Disposition: A | Discharge status: Home / Self Care | Payer: Medicaid Other | Attending: Emergency Medicine | Admitting: Emergency Medicine

## 2011-03-16 DIAGNOSIS — Z9889 Other specified postprocedural states: Secondary | ICD-10-CM | POA: Insufficient documentation

## 2011-03-16 DIAGNOSIS — R32 Unspecified urinary incontinence: Secondary | ICD-10-CM | POA: Insufficient documentation

## 2011-03-16 DIAGNOSIS — W1809XA Striking against other object with subsequent fall, initial encounter: Secondary | ICD-10-CM | POA: Insufficient documentation

## 2011-03-16 DIAGNOSIS — S20229A Contusion of unspecified back wall of thorax, initial encounter: Secondary | ICD-10-CM | POA: Insufficient documentation

## 2011-03-16 DIAGNOSIS — E039 Hypothyroidism, unspecified: Secondary | ICD-10-CM | POA: Insufficient documentation

## 2011-03-16 DIAGNOSIS — R209 Unspecified disturbances of skin sensation: Secondary | ICD-10-CM | POA: Insufficient documentation

## 2011-03-23 ENCOUNTER — Other Ambulatory Visit: Payer: Self-pay | Admitting: Endocrinology

## 2011-04-18 ENCOUNTER — Other Ambulatory Visit: Payer: Self-pay | Admitting: Neurosurgery

## 2011-04-18 DIAGNOSIS — M549 Dorsalgia, unspecified: Secondary | ICD-10-CM

## 2011-04-22 ENCOUNTER — Ambulatory Visit
Admission: RE | Admit: 2011-04-22 | Discharge: 2011-04-22 | Disposition: A | Discharge status: Home / Self Care | Payer: Medicaid Other | Source: Ambulatory Visit | Attending: Neurosurgery | Admitting: Neurosurgery

## 2011-04-22 DIAGNOSIS — M549 Dorsalgia, unspecified: Secondary | ICD-10-CM

## 2011-04-22 MED ORDER — GADOBENATE DIMEGLUMINE 529 MG/ML IV SOLN
20.0000 mL | Freq: Once | INTRAVENOUS | Status: AC | PRN
  Administered 2011-04-22: 20 mL via INTRAVENOUS

## 2011-05-24 ENCOUNTER — Other Ambulatory Visit: Payer: Self-pay | Admitting: Neurosurgery

## 2011-05-24 ENCOUNTER — Other Ambulatory Visit (HOSPITAL_COMMUNITY): Payer: Self-pay | Admitting: Neurosurgery

## 2011-05-24 DIAGNOSIS — M545 Low back pain, unspecified: Secondary | ICD-10-CM

## 2011-05-31 ENCOUNTER — Ambulatory Visit (HOSPITAL_COMMUNITY)
Admission: RE | Admit: 2011-05-31 | Discharge: 2011-05-31 | Disposition: A | Discharge status: Home / Self Care | Payer: Medicaid Other | Source: Ambulatory Visit | Attending: Neurosurgery | Admitting: Neurosurgery

## 2011-05-31 ENCOUNTER — Other Ambulatory Visit (HOSPITAL_COMMUNITY): Payer: Medicaid Other

## 2011-05-31 DIAGNOSIS — Z981 Arthrodesis status: Secondary | ICD-10-CM | POA: Insufficient documentation

## 2011-05-31 DIAGNOSIS — M545 Low back pain, unspecified: Secondary | ICD-10-CM

## 2011-05-31 DIAGNOSIS — M5126 Other intervertebral disc displacement, lumbar region: Secondary | ICD-10-CM | POA: Insufficient documentation

## 2011-05-31 DIAGNOSIS — M79609 Pain in unspecified limb: Secondary | ICD-10-CM | POA: Insufficient documentation

## 2011-05-31 MED ORDER — OXYCODONE-ACETAMINOPHEN 5-325 MG PO TABS: 1.0000 | ORAL_TABLET | ORAL | Status: DC | PRN

## 2011-05-31 MED ORDER — ONDANSETRON HCL 4 MG/2ML IJ SOLN: 4.0000 mg | Freq: Four times a day (QID) | INTRAMUSCULAR | Status: DC | PRN

## 2011-05-31 MED ORDER — MORPHINE SULFATE 4 MG/ML IJ SOLN: 2.0000 mg | INTRAMUSCULAR | Status: DC | PRN

## 2011-05-31 MED ORDER — IOHEXOL 300 MG/ML  SOLN
10.0000 mL | Freq: Once | INTRAMUSCULAR | Status: AC | PRN
  Administered 2011-05-31: 10 mL via INTRATHECAL

## 2011-05-31 NOTE — Progress Notes (Signed)
D/C instructions reviewed with patient and family.  Pt verbalized understanding and pt D/C home via wheelchair with familly.

## 2011-05-31 NOTE — Op Note (Signed)
L4/5 myelogram

## 2011-06-08 ENCOUNTER — Ambulatory Visit: Payer: Medicaid Other | Admitting: Endocrinology

## 2011-06-13 ENCOUNTER — Encounter: Payer: Self-pay | Admitting: Endocrinology

## 2011-06-13 ENCOUNTER — Ambulatory Visit (INDEPENDENT_AMBULATORY_CARE_PROVIDER_SITE_OTHER): Payer: Medicaid Other | Admitting: Endocrinology

## 2011-06-13 ENCOUNTER — Other Ambulatory Visit (INDEPENDENT_AMBULATORY_CARE_PROVIDER_SITE_OTHER): Payer: Medicaid Other

## 2011-06-13 VITALS — BP 124/88 | HR 66 | Temp 97.1°F | Ht 70.0 in | Wt 255.4 lb

## 2011-06-13 DIAGNOSIS — E890 Postprocedural hypothyroidism: Secondary | ICD-10-CM

## 2011-06-13 LAB — TSH: TSH: 7.46 u[IU]/mL — ABNORMAL HIGH (ref 0.35–5.50)

## 2011-06-13 MED ORDER — METOPROLOL TARTRATE 50 MG PO TABS: 50.0000 mg | ORAL_TABLET | Freq: Two times a day (BID) | ORAL | Status: DC

## 2011-06-13 MED ORDER — LEVOTHYROXINE SODIUM 88 MCG PO TABS: 88.0000 ug | ORAL_TABLET | Freq: Every day | ORAL | Status: DC

## 2011-06-13 NOTE — Progress Notes (Signed)
  Subjective:    Patient ID: Jeff [name], male    DOB: 1966-03-25, 46 y.o.   MRN: [id number]  HPI Pt is now almost 11 months s/p i-131 rx for hyperthyroidism due to grave's dz vs multinodular goiter vs both. He is now on synthroid 75/d.  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 Past Medical History  Diagnosis Date  . Thyroid dysfunction   . HYPERTENSION 03/16/2010  . HYPERTHYROIDISM 03/16/2010  . THYROID STORM 03/16/2010  . Altered mental status 03/16/2010    Past Surgical History  Procedure Date  . Back surgery 03/2009, 10/2009    History   Social History  . Marital Status: Married    Spouse Name: N/A    Number of Children: N/A  . Years of Education: N/A   Occupational History  . Disabled    Social History Main Topics  . Smoking status: Never Smoker   . Smokeless tobacco: Not on file  . Alcohol Use: Yes  . Drug Use:   . Sexually Active:    Other Topics Concern  . Not on file   Social History Narrative   Regular exercise-noDisabled to due back problem    Current Outpatient Prescriptions on File Prior to Visit  Medication Sig Dispense Refill  . magnesium oxide (MAG-OX) 400 MG tablet Take 400 mg by mouth daily.        Marland Kitchen oxyCODONE-acetaminophen (PERCOCET) 10-325 MG per tablet Take 1 tablet by mouth every 6 (six) hours as needed. For pain      . potassium gluconate 595 MG TABS Take 595 mg by mouth daily.         No Known Allergies  Family History  Problem Relation Age of Onset  . Thyroid disease Mother     hypothyroidism  . Arthritis Other   . Hypertension Other   . Heart disease Other     Parent, Grandparents  . Diabetes Other     Grandparents    BP 124/88  Pulse 66  Temp(Src) 97.1 F (36.2 C) (Oral)  Ht 5\' 10"  (1.778 m)  Wt 255 lb 6.4 oz (115.849 kg)  BMI 36.65 kg/m2  SpO2 96%   Review of Systems Denies weight change and dysuria.      Objective:   Physical Exam VITAL SIGNS:  See vs  page GENERAL: no distress NECK: There is no palpable thyroid enlargement.  No thyroid nodule is palpable.  No palpable lymphadenopathy at the anterior neck.  Lab Results  Component Value Date   TSH 7.46* 06/13/2011      Assessment & Plan:  Post-i-131 hypothyroidism, needs increased rx ED, possible caused or exac by metoprolol HTN.  He can tolerate reduction in metoprolol

## 2011-06-13 NOTE — Patient Instructions (Addendum)
Reduce metoprolol to 50 mg, 2x a day. Please schedule an appointment with your doctor, to follow-up your blood pressure and ED symptoms, as you will not need to come back here as often for your thyroid. Please come back for a follow-up appointment here in 6 months. Your require less than a full daily replacement amount of thyroid hormone.  This means that the thyroid was not completely destroyed by the radioactive iodine.  Therefore you have some risk that the overactivity could come back.  this may take many years to happen.  If this happens, you would first notice that your medication requirement would decrease to none.  Then the overactivity would come back.  We'll follow your blood tests for this.   (update: i left message on phone-tree:  Increase synthroid to 88/d)

## 2011-08-22 ENCOUNTER — Encounter: Payer: Self-pay | Admitting: Endocrinology

## 2011-09-12 ENCOUNTER — Other Ambulatory Visit: Payer: Self-pay | Admitting: Endocrinology

## 2011-12-19 ENCOUNTER — Ambulatory Visit (INDEPENDENT_AMBULATORY_CARE_PROVIDER_SITE_OTHER): Payer: Medicaid Other | Admitting: Endocrinology

## 2011-12-19 ENCOUNTER — Other Ambulatory Visit (INDEPENDENT_AMBULATORY_CARE_PROVIDER_SITE_OTHER): Payer: Medicaid Other

## 2011-12-19 ENCOUNTER — Encounter: Payer: Self-pay | Admitting: Endocrinology

## 2011-12-19 VITALS — BP 128/82 | HR 72 | Temp 98.0°F | Wt 255.0 lb

## 2011-12-19 DIAGNOSIS — E890 Postprocedural hypothyroidism: Secondary | ICD-10-CM

## 2011-12-19 LAB — TSH: TSH: 3.96 u[IU]/mL (ref 0.35–5.50)

## 2011-12-19 NOTE — Progress Notes (Signed)
  Subjective:    Patient ID: Jeff [name], male    DOB: 11/16/1965, 46 y.o.   MRN: [id number]  HPI Pt is now almost 17 months s/p i-131 rx for hyperthyroidism due to grave's dz vs multinodular goiter vs both. He is now on synthroid 88 mcg/day.  pt states he feels well in general, except for chronic low-back pain.   Past Medical History  Diagnosis Date  . Thyroid dysfunction   . HYPERTENSION 03/16/2010  . HYPERTHYROIDISM 03/16/2010  . THYROID STORM 03/16/2010  . Altered mental status 03/16/2010    Past Surgical History  Procedure Date  . Back surgery 03/2009, 10/2009    History   Social History  . Marital Status: Married    Spouse Name: N/A    Number of Children: N/A  . Years of Education: N/A   Occupational History  . Disabled    Social History Main Topics  . Smoking status: Never Smoker   . Smokeless tobacco: Not on file  . Alcohol Use: Yes  . Drug Use:   . Sexually Active:    Other Topics Concern  . Not on file   Social History Narrative   Regular exercise-noDisabled to due back problem    Current Outpatient Prescriptions on File Prior to Visit  Medication Sig Dispense Refill  . levothyroxine (SYNTHROID, LEVOTHROID) 88 MCG tablet Take 1 tablet (88 mcg total) by mouth daily.  30 tablet  7  . magnesium oxide (MAG-OX) 400 MG tablet Take 400 mg by mouth daily.        . metoprolol (LOPRESSOR) 50 MG tablet TAKE 1 TABLET BY MOUTH 2 TIMES DAILY.  60 tablet  5  . oxyCODONE-acetaminophen (PERCOCET) 10-325 MG per tablet Take 1 tablet by mouth every 6 (six) hours as needed. For pain      . potassium gluconate 595 MG TABS Take 595 mg by mouth daily.         No Known Allergies  Family History  Problem Relation Age of Onset  . Thyroid disease Mother     hypothyroidism  . Arthritis Other   . Hypertension Other   . Heart disease Other     Parent, Grandparents  . Diabetes Other     Grandparents    BP 128/82  Pulse 72  Temp 98 F (36.7 C) (Oral)  Wt 255 lb  (115.667 kg)  SpO2 98%  Review of Systems Denies weight change    Objective:   Physical Exam VITAL SIGNS:  See vs page GENERAL: no distress NECK: There is no palpable thyroid enlargement.  No thyroid nodule is palpable.  No palpable lymphadenopathy at the anterior neck.   Lab Results  Component Value Date   TSH 3.96 12/19/2011      Assessment & Plan:  Post-i-131 hypothyroidism, well-replaced

## 2011-12-19 NOTE — Patient Instructions (Addendum)
blood tests are being requested for you today.  You will receive a letter with results. Please return in 1 year. 

## 2011-12-20 ENCOUNTER — Telehealth: Payer: Self-pay | Admitting: *Deleted

## 2011-12-20 NOTE — Telephone Encounter (Signed)
Pt called requesting lab results from last OV-called pt, no answer/unable to leave message.

## 2011-12-21 NOTE — Telephone Encounter (Signed)
Called pt, no answer/unable to leave message.  

## 2011-12-22 NOTE — Telephone Encounter (Signed)
Called pt, no answer/unable to leave message (per recording, person is unavailable). Letter with results mailed to pt's address on file, no additional number's in chart. Closing phone note.

## 2011-12-23 NOTE — Telephone Encounter (Signed)
The pt called the triage line hoping to get his lab results.  I tried calling both numbers listed, and none were working.  The pt did not leave a working number on the triage call.

## 2011-12-28 ENCOUNTER — Telehealth: Payer: Self-pay | Admitting: Endocrinology

## 2011-12-28 NOTE — Telephone Encounter (Signed)
Laverne advised that SAE recommended pt continue current dose per result letter.

## 2011-12-28 NOTE — Telephone Encounter (Signed)
Caller: [name]; Phone: [phone]; Reason for Call: Caller: [name]; Patient Name: Jeff [name]; PCP: Romero Belling; Best Callback Phone Number: [phone].    Spouse calling for results of lab results from 12/19/11.  RN reviewed Epic Emergency Health Record.  RN noted, per documentation in Epic, that several attempts were made to notify patient of lab results and letter was sent to patient.   RN requested to speak with patient.  RN informed patient, Jeff [name], of above.  Patient states he is in the process of moving.  States he will notify office with change of address information when it is availalbe.  Patient provided with TSH results as documented in Epic.  Patient informed TSH was 3.  96 on 12/19/11.   PATIENT INQUIRING IF HE IS TO MAKE ANY CHANGES IN CURRENT SYNTHROID DOSAGE.  PATIENT CAN BE REACHED @ [phone].

## 2012-02-02 ENCOUNTER — Other Ambulatory Visit: Payer: Self-pay | Admitting: Endocrinology

## 2012-04-03 ENCOUNTER — Other Ambulatory Visit: Payer: Self-pay | Admitting: Endocrinology

## 2012-06-11 ENCOUNTER — Non-Acute Institutional Stay (HOSPITAL_COMMUNITY)
Admission: EM | Admit: 2012-06-11 | Discharge: 2012-06-12 | Disposition: A | Discharge status: Home / Self Care | Payer: Medicaid Other | Source: Ambulatory Visit | Attending: Cardiology | Admitting: Cardiology

## 2012-06-11 ENCOUNTER — Emergency Department (HOSPITAL_COMMUNITY): Payer: Medicaid Other

## 2012-06-11 ENCOUNTER — Encounter (HOSPITAL_COMMUNITY): Payer: Self-pay | Admitting: Adult Health

## 2012-06-11 DIAGNOSIS — R079 Chest pain, unspecified: Secondary | ICD-10-CM | POA: Insufficient documentation

## 2012-06-11 DIAGNOSIS — I10 Essential (primary) hypertension: Secondary | ICD-10-CM

## 2012-06-11 DIAGNOSIS — R0789 Other chest pain: Secondary | ICD-10-CM | POA: Diagnosis present

## 2012-06-11 DIAGNOSIS — Z8249 Family history of ischemic heart disease and other diseases of the circulatory system: Secondary | ICD-10-CM | POA: Insufficient documentation

## 2012-06-11 DIAGNOSIS — I214 Non-ST elevation (NSTEMI) myocardial infarction: Principal | ICD-10-CM | POA: Diagnosis present

## 2012-06-11 DIAGNOSIS — E785 Hyperlipidemia, unspecified: Secondary | ICD-10-CM | POA: Insufficient documentation

## 2012-06-11 DIAGNOSIS — E890 Postprocedural hypothyroidism: Secondary | ICD-10-CM

## 2012-06-11 LAB — BASIC METABOLIC PANEL
BUN: 7 mg/dL (ref 6–23)
CO2: 28 mEq/L (ref 19–32)
Calcium: 9.2 mg/dL (ref 8.4–10.5)
Chloride: 96 mEq/L (ref 96–112)
Creatinine, Ser: 0.99 mg/dL (ref 0.50–1.35)
GFR calc Af Amer: >90 mL/min (ref >90)
GFR calc non Af Amer: >90 mL/min (ref >90)
Glucose, Bld: 83 mg/dL (ref 70–99)
Potassium: 4.2 mEq/L (ref 3.5–5.1)
Sodium: 135 mEq/L (ref 135–145)

## 2012-06-11 LAB — CK TOTAL AND CKMB (NOT AT ARMC)
CK, MB: 5.6 ng/mL — ABNORMAL HIGH (ref 0.3–4.0)
Relative Index: 2.5 (ref 0.0–2.5)
Total CK: 225 U/L (ref 7–232)

## 2012-06-11 LAB — D-DIMER, QUANTITATIVE (NOT AT ARMC): D-Dimer, Quant: <0.27 ug/mL-FEU (ref 0.00–0.48)

## 2012-06-11 LAB — POCT I-STAT TROPONIN I: Troponin i, poc: 0.39 ng/mL (ref 0.00–0.08)

## 2012-06-11 LAB — CBC
HCT: 48.2 % (ref 39.0–52.0)
Hemoglobin: 16 g/dL (ref 13.0–17.0)
MCH: 29.7 pg (ref 26.0–34.0)
MCHC: 33.2 g/dL (ref 30.0–36.0)
MCV: 89.4 fL (ref 78.0–100.0)
Platelets: 155 10*3/uL (ref 150–400)
RBC: 5.39 MIL/uL (ref 4.22–5.81)
RDW: 13.4 % (ref 11.5–15.5)
WBC: 9.8 10*3/uL (ref 4.0–10.5)

## 2012-06-11 LAB — TROPONIN I: Troponin I: 0.65 ng/mL (ref <0.30)

## 2012-06-11 MED ORDER — ASPIRIN EC 81 MG PO TBEC
81.0000 mg | DELAYED_RELEASE_TABLET | Freq: Every day | ORAL | Status: DC
Start: 2012-06-11 — End: 2012-06-12
  Administered 2012-06-11 – 2012-06-12 (×2): 81 mg via ORAL
  Filled 2012-06-11 (×2): qty 1

## 2012-06-11 MED ORDER — ASPIRIN 81 MG PO CHEW
324.0000 mg | CHEWABLE_TABLET | ORAL | Status: AC
  Administered 2012-06-12: 324 mg via ORAL
  Filled 2012-06-11: qty 4

## 2012-06-11 MED ORDER — HEPARIN BOLUS VIA INFUSION
4000.0000 [IU] | Freq: Once | INTRAVENOUS | Status: AC
  Administered 2012-06-11: 4000 [IU] via INTRAVENOUS

## 2012-06-11 MED ORDER — DIAZEPAM 5 MG PO TABS
5.0000 mg | ORAL_TABLET | ORAL | Status: AC
  Administered 2012-06-12: 5 mg via ORAL
  Filled 2012-06-11: qty 1

## 2012-06-11 MED ORDER — SODIUM CHLORIDE 0.9 % IJ SOLN: 3.0000 mL | Freq: Two times a day (BID) | INTRAMUSCULAR | Status: DC

## 2012-06-11 MED ORDER — METOPROLOL TARTRATE 50 MG PO TABS: 50.0000 mg | ORAL_TABLET | Freq: Two times a day (BID) | ORAL | Status: DC

## 2012-06-11 MED ORDER — NITROGLYCERIN 0.4 MG SL SUBL: 0.4000 mg | SUBLINGUAL_TABLET | SUBLINGUAL | Status: DC | PRN

## 2012-06-11 MED ORDER — SODIUM CHLORIDE 0.9 % IV SOLN: 1.0000 mL/kg/h | INTRAVENOUS | Status: DC

## 2012-06-11 MED ORDER — SODIUM CHLORIDE 0.9 % IJ SOLN: 3.0000 mL | INTRAMUSCULAR | Status: DC | PRN

## 2012-06-11 MED ORDER — IBUPROFEN 800 MG PO TABS
800.0000 mg | ORAL_TABLET | Freq: Once | ORAL | Status: AC
  Administered 2012-06-11: 800 mg via ORAL
  Filled 2012-06-11: qty 1

## 2012-06-11 MED ORDER — ATORVASTATIN CALCIUM 40 MG PO TABS
40.0000 mg | ORAL_TABLET | Freq: Every day | ORAL | Status: DC
  Administered 2012-06-11 – 2012-06-12 (×2): 40 mg via ORAL
  Filled 2012-06-11 (×2): qty 1

## 2012-06-11 MED ORDER — ONDANSETRON HCL 4 MG/2ML IJ SOLN: 4.0000 mg | Freq: Four times a day (QID) | INTRAMUSCULAR | Status: DC | PRN

## 2012-06-11 MED ORDER — ACETAMINOPHEN 325 MG PO TABS
650.0000 mg | ORAL_TABLET | ORAL | Status: DC | PRN
  Filled 2012-06-11: qty 2

## 2012-06-11 MED ORDER — HEPARIN (PORCINE) IN NACL 100-0.45 UNIT/ML-% IJ SOLN
1400.0000 [IU]/h | INTRAMUSCULAR | Status: DC
  Administered 2012-06-11: 1400 [IU]/h via INTRAVENOUS
  Filled 2012-06-11 (×4): qty 250

## 2012-06-11 MED ORDER — MAGNESIUM OXIDE 400 MG PO TABS: 400.0000 mg | ORAL_TABLET | Freq: Every day | ORAL | Status: DC

## 2012-06-11 MED ORDER — MAGNESIUM OXIDE 400 (241.3 MG) MG PO TABS
400.0000 mg | ORAL_TABLET | Freq: Every day | ORAL | Status: DC
  Administered 2012-06-12: 400 mg via ORAL
  Filled 2012-06-11: qty 1

## 2012-06-11 MED ORDER — SODIUM CHLORIDE 0.9 % IV SOLN: 250.0000 mL | INTRAVENOUS | Status: DC | PRN

## 2012-06-11 MED ORDER — LEVOTHYROXINE SODIUM 88 MCG PO TABS
88.0000 ug | ORAL_TABLET | Freq: Every day | ORAL | Status: DC
  Administered 2012-06-12: 88 ug via ORAL
  Filled 2012-06-11 (×3): qty 1

## 2012-06-11 MED ORDER — METOPROLOL TARTRATE 50 MG PO TABS
50.0000 mg | ORAL_TABLET | Freq: Two times a day (BID) | ORAL | Status: DC
  Administered 2012-06-11 – 2012-06-12 (×2): 50 mg via ORAL
  Filled 2012-06-11 (×3): qty 1

## 2012-06-11 NOTE — ED Provider Notes (Signed)
History     CSN: [id number]  Arrival date & time 06/11/12  1753   First MD Initiated Contact with Patient 06/11/12 1800      Chief Complaint  Patient presents with  . Chest Pain    (Consider location/radiation/quality/duration/timing/severity/associated sxs/prior treatment) HPI Comments: 47 year old male with a history of hypertension and hyperthyroidism presents emergency department complaining of midsternal chest pain radiating to the left side of his chest into his left scapula and shoulder for the past 2-3 days. The pain has been intermittent. Nothing in specific makes the pain come and go. When the pain is present it "lasts a while" rated 10 out of 10. He has not tried any alleviating factors. Despite triage summary patient denies shortness of breath, nausea or vomiting. No diaphoresis. He denies ever having chest pain like this in the past. Denies heavy lifting or hard physical activity out of the normal. Denies cough or recent illness.  The history is provided by the patient.    Past Medical History  Diagnosis Date  . Thyroid dysfunction   . HYPERTENSION 03/16/2010  . HYPERTHYROIDISM 03/16/2010  . THYROID STORM 03/16/2010  . Altered mental status 03/16/2010    Past Surgical History  Procedure Date  . Back surgery 03/2009, 10/2009    Family History  Problem Relation Age of Onset  . Thyroid disease Mother     hypothyroidism  . Arthritis Other   . Hypertension Other   . Heart disease Other     Parent, Grandparents  . Diabetes Other     Grandparents    History  Substance Use Topics  . Smoking status: Never Smoker   . Smokeless tobacco: Not on file  . Alcohol Use: Yes      Review of Systems  Constitutional: Negative for fever, chills and diaphoresis.  HENT: Negative for neck pain and neck stiffness.   Respiratory: Negative for cough and shortness of breath.   Cardiovascular: Positive for chest pain. Negative for leg swelling.  Gastrointestinal: Negative for  nausea and vomiting.  Musculoskeletal: Positive for arthralgias.  Skin: Negative for rash.  Neurological: Negative for dizziness, light-headedness and headaches.  All other systems reviewed and are negative.    Allergies  Review of patient's allergies indicates no known allergies.  Home Medications   Current Outpatient Rx  Name  Route  Sig  Dispense  Refill  . LEVOTHYROXINE SODIUM 88 MCG PO TABS      TAKE 1 TABLET ONCE DAILY.   30 tablet   8     Generic For:*LEVOTHROID Generic For:*LEVOTHR ...   . MAGNESIUM OXIDE 400 MG PO TABS   Oral   Take 400 mg by mouth daily.           Marland Kitchen METOPROLOL TARTRATE 50 MG PO TABS      TAKE 1 TABLET BY MOUTH 2 TIMES DAILY.   60 tablet   5     Generic WUJ:WJXBJYNWG 50MG    . OXYCODONE-ACETAMINOPHEN 10-325 MG PO TABS   Oral   Take 1 tablet by mouth every 6 (six) hours as needed. For pain         . POTASSIUM GLUCONATE 595 MG PO TABS   Oral   Take 595 mg by mouth daily.            BP 171/103  Pulse 82  Temp 98.1 F (36.7 C) (Oral)  Resp 18  SpO2 98%  Physical Exam  Nursing note and vitals reviewed. Constitutional: He is oriented to person,  place, and time. He appears well-developed and well-nourished. No distress.  HENT:  Head: Normocephalic and atraumatic.  Mouth/Throat: Oropharynx is clear and moist.  Eyes: Conjunctivae normal and EOM are normal. Pupils are equal, round, and reactive to light.  Neck: Normal range of motion. Neck supple. No JVD present.  Cardiovascular: Normal rate, regular rhythm, normal heart sounds and intact distal pulses.        No extremity edema.  Pulmonary/Chest: Effort normal and breath sounds normal. No respiratory distress. He has no wheezes. He has no rhonchi. He has no rales. He exhibits tenderness (left pectoral muscle).  Abdominal: Soft. Bowel sounds are normal. There is no tenderness.  Musculoskeletal: Normal range of motion. He exhibits no edema.  Neurological: He is alert and  oriented to person, place, and time.  Skin: Skin is warm and dry.  Psychiatric: He has a normal mood and affect. His behavior is normal.    ED Course  Procedures (including critical care time)  Labs Reviewed  POCT I-STAT TROPONIN I - Abnormal; Notable for the following:    Troponin i, poc 0.39 (*)     All other components within normal limits  CBC  BASIC METABOLIC PANEL    Date: 06/11/2012  Rate: 83  Rhythm: normal sinus rhythm  QRS Axis: normal  Intervals: normal  ST/T Wave abnormalities: normal  Conduction Disutrbances:none  Narrative Interpretation: normal sinus rhythm, normal EKG  Old EKG Reviewed: none available   Dg Chest 2 View  06/11/2012  *RADIOLOGY REPORT*  Clinical Data: Chest pain  CHEST - 2 VIEW  Comparison: 09/01/2010; 07/30/2010; 12/01/2009  Findings: Grossly unchanged cardiac silhouette and mediastinal contours given slightly decreased lung volumes.  No focal airspace opacity.  No pleural effusion or pneumothorax.  Post right-sided rotator cuff repair.  No acute osseous abnormalities.  IMPRESSION: No acute cardiopulmonary disease.   Original Report Authenticated By: Tacey Ruiz, MD      1. NSTEMI (non-ST elevated myocardial infarction)       MDM  47 y/o male with NSTEMI. Troponin 0.39. Heparin drip started. He is currently pain free. EKG, CXR and labs otherwise unremarkable. Spoke with Dr. Dietrich Pates with [facility] cardiology who will evaluate patient for admission. Case discussed with Dr. Manus Gunning who also evaluated patient and agrees with plan of care.         Jeff Mace, PA-C 06/11/12 2105

## 2012-06-11 NOTE — ED Notes (Signed)
Pt. oob to the bathroom,. Gait steady

## 2012-06-11 NOTE — ED Provider Notes (Signed)
Medical screening examination/treatment/procedure(s) were conducted as a shared visit with non-physician practitioner(s) and myself.  I personally evaluated the patient during the encounter  See my additional note  Glynn Octave, MD 06/11/12 2135

## 2012-06-11 NOTE — Progress Notes (Signed)
ANTICOAGULATION CONSULT NOTE - Initial Consult  Pharmacy Consult for UFH Indication: NSTEMI  No Known Allergies  Patient Measurements: Height: 5' 10.08" (178 cm) Weight: 255 lb 1.2 oz (115.7 kg) IBW/kg (Calculated) : 73.18  Heparin Dosing Weight: 98kg  Vital Signs: Temp: 98.1 F (36.7 C) (01/27 1756) Temp src: Oral (01/27 1756) BP: 171/103 mmHg (01/27 1756) Pulse Rate: 77  (01/27 1902)  Labs:  Basename 06/11/12 1759  HGB 16.0  HCT 48.2  PLT 155  APTT --  LABPROT --  INR --  HEPARINUNFRC --  CREATININE 0.99  CKTOTAL --  CKMB --  TROPONINI --    Estimated Creatinine Clearance: 120.2 ml/min (by C-G formula based on Cr of 0.99).   Medical History: Past Medical History  Diagnosis Date  . Thyroid dysfunction   . HYPERTENSION 03/16/2010  . HYPERTHYROIDISM 03/16/2010  . THYROID STORM 03/16/2010  . Altered mental status 03/16/2010    Medications:   (Not in a hospital admission)  Assessment: 47 y/o male patient admitted with chest pain requiring anticoagulation for NSTEMI. EKG wnl with positive troponin. Noted baseline plt 155.  Goal of Therapy:  Heparin level 0.3-0.7 units/ml Monitor platelets by anticoagulation protocol: Yes   Plan:  Heparin 4000 unit IV bolus followed by infusion at 1400 units/hr. Check 6 hour heparin level with daily cbc and heparin level.  Verlene Mayer, PharmD, BCPS Pager [phone] 06/11/2012,7:02 PM

## 2012-06-11 NOTE — ED Notes (Signed)
[  patient has eaten sandwich.  Denies chest pain at present.  No s/sx of distress.  Awaiting bed assignment at this time

## 2012-06-11 NOTE — ED Notes (Signed)
Patient has been up to the bathroom,  States he has had some indigestion type pain.  He remains on cardiac monitoring.  Heparin drip continues to infuse.  Cardiology now at bedside.  Cards also aware of new elevated troponin level

## 2012-06-11 NOTE — ED Notes (Signed)
Patient aware of bed assignment.  Report has been called to Thayer Ohm, Charity fundraiser.  Family has left and will return

## 2012-06-11 NOTE — ED Notes (Signed)
Presents with sternal chest pressure that radiates to left chest into left scapula and left shoulder and down into left arm associated with SOB and nausea. Pain began 2-3 days ago. Pain is intermittent, nothing makes better. Nothing makes pain worse.

## 2012-06-11 NOTE — ED Notes (Signed)
Patient is resting.  Alert and oriented.  Skin warm and dry.  Patient with reported 4 to 5 day hx of intermittent left sided chest pain into his back.  Patient states today his pain radiated into the left arm.  He denies sob.  Denies n/v.  Wife reports she did notice patient not "looking good" over the past several days.  Patient remains on cardiac monitoring.  He is aware of elevated troponin and has been educated on need to start heparin and admit patient to hospital.

## 2012-06-11 NOTE — H&P (Signed)
History and Physical  Patient ID: Jeff [name] MRN: [id number], SOB: 1965/09/06 46 y.o. Date of Encounter: 06/11/2012, 8:22 PM  Primary Physician: Abigail Miyamoto, MD Primary Cardiologist: None  Chief Complaint: chest pain  HPI: 47 y.o. male w/ PMHx significant for HLD, HTN, and hyperthyroidism who presented to [facility] on 06/11/2012 with complaints of chest pain.  No prior cardiac history. Denies DM, MI, CHF, or CVA. No echo, stress test, or cardiac cath. Family history of heart disease including MI in his mother (?43s), father (unsure age), and grandparents. He reports having intermittent stabbing left-sided chest pain with radiation to his back and left arm since last week. It usually lasts about 15-45 minutes. He feels hot with it, but no diaphoresis. Mild sob. No nausea. Today while driving he experienced the pain again and his family member convinced him to present to the ED. The pain lasted about 1hr and subsided without intervention. He is not very active, but is able to keep up with his 64yr old grandson and climb the stairs to his apartment without chest pain. Denies palpitations, orthopnea, PND, edema, syncope, fever, chills, cough, abd pain, melena/hematochezia/hematuria. No recent surgery, prolonged immobilization, or extended travel.  EKG revealed NSR with no acute ST/T changes. CXR is without acute cardiopulmonary abnormalities. Labs are significant for poc troponin 0.39, otherwise unremarkable CBC/BMET. He was placed on Heparin drip. He is currently pain free.   Past Medical History  Diagnosis Date  . HYPERTENSION 03/16/2010  . HYPERTHYROIDISM 03/16/2010  . THYROID STORM 03/16/2010  . Altered mental status 03/16/2010    Surgical History:  Past Surgical History  Procedure Date  . Back surgery 03/2009, 10/2009    Home Meds: Medication Sig  atorvastatin (LIPITOR) 40 MG tablet Take 40 mg by mouth daily.  levothyroxine (SYNTHROID, LEVOTHROID) 88 MCG tablet  Take 88 mcg by mouth daily.  magnesium oxide (MAG-OX) 400 MG tablet Take 400 mg by mouth daily.    metoprolol (LOPRESSOR) 50 MG tablet Take 50 mg by mouth 2 (two) times daily.  oxyCODONE-acetaminophen (PERCOCET) 10-325 MG per tablet Take 1 tablet by mouth every 6 (six) hours as needed. For pain   Allergies: No Known Allergies  History   Social History  . Marital Status: Married    Spouse Name: N/A    Number of Children: N/A  . Years of Education: N/A   Occupational History  . Disabled    Social History Main Topics  . Smoking status: Never Smoker   . Smokeless tobacco: Not on file  . Alcohol Use: modest Yes  . Drug Use: denies cocaine or other illicit drugs.   . Sexually Active:    Other Topics Concern  . Not on file   Social History Narrative   Regular exercise-noDisabled to due back problem    Family History  Problem Relation Age of Onset  . Thyroid disease Mother     hypothyroidism  . Arthritis Other   . Hypertension Other   . Heart disease Other     Parents, Grandparents  . Diabetes Other     Grandparents   Review of Systems: General: negative for chills, fever, night sweats or weight changes.  Cardiovascular: As per HPI Dermatological: negative for rash Respiratory: negative for cough or wheezing Urologic: negative for hematuria Abdominal: negative for nausea, vomiting, diarrhea, bright red blood per rectum, melena, or hematemesis Neurologic: negative for visual changes, syncope, or dizziness All other systems reviewed and are otherwise negative except as noted above.  Labs:   Component Value Date   WBC 9.8 06/11/2012   HGB 16.0 06/11/2012   HCT 48.2 06/11/2012   MCV 89.4 06/11/2012   PLT 155 06/11/2012    Lab 06/11/12 1759  NA 135  K 4.2  CL 96  CO2 28  BUN 7  CREATININE 0.99  CALCIUM 9.2  GLUCOSE 83     06/11/2012 18:38  Troponin i, poc 0.39 (HH)   Radiology/Studies:   06/11/2012  - CHEST - 2 VIEW   Findings: Grossly unchanged cardiac  silhouette and mediastinal contours given slightly decreased lung volumes.  No focal airspace opacity.  No pleural effusion or pneumothorax.  Post right-sided rotator cuff repair.  No acute osseous abnormalities.  IMPRESSION: No acute cardiopulmonary disease.   EKG: NSR with no acute ST/T changes  Physical Exam: Blood pressure 171/103, pulse 77, temperature 98.1 F (36.7 C), temperature source Oral, resp. rate 16, height 5' 10.08" (1.78 m), weight 255 lb 1.2 oz (115.7 kg), SpO2 99.00%. General: Overweight black male in no acute distress. Head: Normocephalic, atraumatic, sclera non-icteric, nares are without discharge Neck: Supple. Negative for carotid bruits or JVD Lungs: Clear bilaterally to auscultation without wheezes, rales, or rhonchi. Breathing is unlabored. Heart: RRR with S1 S2. No murmurs, rubs, or gallops appreciated. Abdomen: Soft, non-tender, non-distended with normoactive bowel sounds. No rebound/guarding. No obvious abdominal masses. Msk:  Strength and tone appear normal for age. Extremities: No edema. No clubbing or cyanosis. Distal pedal pulses are intact and equal bilaterally. Neuro: Alert and oriented X 3. Moves all extremities spontaneously. Psych:  Responds to questions appropriately with a normal affect.   ASSESSMENT AND PLAN:  47 y.o. male w/ PMHx significant for HLD, HTN, and hyperthyroidism who presented to [facility] on 06/11/2012 with complaints of chest pain.  1. NSTEMI 2. Hypertension 3. Hyperlipidemia 4. Hyperthyroidism  Patient presents with ~4 day history of intermittent chest pain with typical and atypical features for ACS. No risk factors for PE. Not hypoxic or tachycardic. No prior cardiac history. Cardiac risk factors of HTN, HLD, sedentary lifestyle, and family history of early CAD. EKG is negative. Poc troponin 0.39 and repeat troponin I 0.65. He is currently pain free. Would favor observing troponin trend and EKG overnight and planning for  cardiac cath tomorrow. Check D-Dimer and lipid panel (direct LDL). TSH pending. Cont IV heparin, statin, BB. Start 81mg  ASA.  [place], PA-C 06/11/2012, 8:22 PM  Cardiology Attending Patient interviewed and examined. Discussed with [facility], PA-C.  Above note annotated and modified based upon my findings.  Patient presents with unexplained elevation of troponin and a less than compelling history for ACS; however, he has significant risk factors and no alternative explanation for now.  Multiple lab studies pending as noted above.  Will order echo as well.  If no further information is forthcoming, coronary angiography will be necessary to exclude or verify the presence of ASCVD. [place] Bing, MD 06/11/2012, 8:54 PM

## 2012-06-11 NOTE — ED Provider Notes (Signed)
This chart was scribed for Glynn Octave, MD by Bennett Scrape, ED Scribe. This patient was seen in room A11C/A11C and the patient's care was started at 6:56 PM.  Jeff [name] is a 47 y.o. male who presents to the Emergency Department complaining of 3 to 4 days of intermittent sternally located CP that radiates to the left scapula and down the left arm.He denies having pain currently. He denies having a h/o heart or lung problems, HLD and DM. He denies having any prior stress test or cardiac catheterizations. He denies nausea, emesis, SOB, diaphoresis. He has a h/o HTN and hyperthyroid.  PE CONSTITUTIONAL: No distress CV: Regular rate and rhythm, no reproducible tenderness ABDOMEN: Soft, non-tender  6:59 PM- Informed pt of lab results showing an elevate heart enzyme. Discussed treatment plan which includes consult with cardiology with pt at bedside and pt agreed to plan.   3 days of intermittent left-sided chest pain that radiates to arm. No shortness of breath, nausea, diaphoresis. No ischemic changes on EKG. Troponin positive. Cardiology consult for NSTEMI.  CRITICAL CARE Performed by: Glynn Octave   Total critical care time: 30  Critical care time was exclusive of separately billable procedures and treating other patients.  Critical care was necessary to treat or prevent imminent or life-threatening deterioration.  Critical care was time spent personally by me on the following activities: development of treatment plan with patient and/or surrogate as well as nursing, discussions with consultants, evaluation of patient's response to treatment, examination of patient, obtaining history from patient or surrogate, ordering and performing treatments and interventions, ordering and review of laboratory studies, ordering and review of radiographic studies, pulse oximetry and re-evaluation of patient's condition.    Glynn Octave, MD 06/11/12 2134

## 2012-06-12 ENCOUNTER — Encounter (HOSPITAL_COMMUNITY)
Admission: EM | Disposition: A | Discharge status: Home / Self Care | Payer: Self-pay | Source: Ambulatory Visit | Attending: Cardiology

## 2012-06-12 DIAGNOSIS — R0789 Other chest pain: Secondary | ICD-10-CM | POA: Diagnosis present

## 2012-06-12 DIAGNOSIS — R079 Chest pain, unspecified: Secondary | ICD-10-CM

## 2012-06-12 HISTORY — PX: LEFT HEART CATHETERIZATION WITH CORONARY ANGIOGRAM: SHX5451

## 2012-06-12 LAB — LIPID PANEL
Cholesterol: 107 mg/dL (ref 0–200)
HDL: 43 mg/dL (ref >39)
LDL Cholesterol: 45 mg/dL (ref 0–99)
Total CHOL/HDL Ratio: 2.5 RATIO
Triglycerides: 93 mg/dL (ref <150)
VLDL: 19 mg/dL (ref 0–40)

## 2012-06-12 LAB — CBC
HCT: 42.6 % (ref 39.0–52.0)
Hemoglobin: 14 g/dL (ref 13.0–17.0)
MCH: 29 pg (ref 26.0–34.0)
MCHC: 32.9 g/dL (ref 30.0–36.0)
MCV: 88.4 fL (ref 78.0–100.0)
Platelets: 133 10*3/uL — ABNORMAL LOW (ref 150–400)
RBC: 4.82 MIL/uL (ref 4.22–5.81)
RDW: 13.5 % (ref 11.5–15.5)
WBC: 8.1 10*3/uL (ref 4.0–10.5)

## 2012-06-12 LAB — PROTIME-INR
INR: 1.05 (ref 0.00–1.49)
Prothrombin Time: 13.6 seconds (ref 11.6–15.2)

## 2012-06-12 LAB — LDL CHOLESTEROL, DIRECT: Direct LDL: 43 mg/dL

## 2012-06-12 LAB — TROPONIN I
Troponin I: 1.66 ng/mL (ref <0.30)
Troponin I: 0.53 ng/mL (ref <0.30)

## 2012-06-12 LAB — RAPID URINE DRUG SCREEN, HOSP PERFORMED
Amphetamines: NOT DETECTED
Barbiturates: NOT DETECTED
Benzodiazepines: POSITIVE — AB
Cocaine: NOT DETECTED
Opiates: NOT DETECTED
Tetrahydrocannabinol: NOT DETECTED

## 2012-06-12 LAB — HEPARIN LEVEL (UNFRACTIONATED)
Heparin Unfractionated: 0.42 IU/mL (ref 0.30–0.70)
Heparin Unfractionated: 0.49 IU/mL (ref 0.30–0.70)

## 2012-06-12 LAB — TSH: TSH: 1.82 u[IU]/mL (ref 0.350–4.500)

## 2012-06-12 SURGERY — LEFT HEART CATHETERIZATION WITH CORONARY ANGIOGRAM
Anesthesia: LOCAL

## 2012-06-12 MED ORDER — HEPARIN SODIUM (PORCINE) 1000 UNIT/ML IJ SOLN
INTRAMUSCULAR | Status: AC
  Filled 2012-06-12: qty 1

## 2012-06-12 MED ORDER — NITROGLYCERIN 0.2 MG/ML ON CALL CATH LAB
INTRAVENOUS | Status: AC
  Filled 2012-06-12: qty 1

## 2012-06-12 MED ORDER — OXYCODONE-ACETAMINOPHEN 5-325 MG PO TABS: 1.0000 | ORAL_TABLET | ORAL | Status: DC | PRN

## 2012-06-12 MED ORDER — ONDANSETRON HCL 4 MG/2ML IJ SOLN: 4.0000 mg | Freq: Four times a day (QID) | INTRAMUSCULAR | Status: DC | PRN

## 2012-06-12 MED ORDER — VERAPAMIL HCL 2.5 MG/ML IV SOLN
INTRAVENOUS | Status: AC
  Filled 2012-06-12: qty 2

## 2012-06-12 MED ORDER — DIAZEPAM 2 MG PO TABS: 2.0000 mg | ORAL_TABLET | ORAL | Status: DC | PRN

## 2012-06-12 MED ORDER — FENTANYL CITRATE 0.05 MG/ML IJ SOLN
INTRAMUSCULAR | Status: AC
  Filled 2012-06-12: qty 2

## 2012-06-12 MED ORDER — LIDOCAINE HCL (PF) 1 % IJ SOLN
INTRAMUSCULAR | Status: AC
  Filled 2012-06-12: qty 30

## 2012-06-12 MED ORDER — MIDAZOLAM HCL 2 MG/2ML IJ SOLN
INTRAMUSCULAR | Status: AC
Start: 2012-06-12 — End: 2012-06-12
  Filled 2012-06-12: qty 2

## 2012-06-12 MED ORDER — ASPIRIN EC 325 MG PO TBEC: 325.0000 mg | DELAYED_RELEASE_TABLET | Freq: Every day | ORAL | Status: DC

## 2012-06-12 MED ORDER — HEPARIN (PORCINE) IN NACL 2-0.9 UNIT/ML-% IJ SOLN
INTRAMUSCULAR | Status: AC
  Filled 2012-06-12: qty 1500

## 2012-06-12 MED ORDER — SODIUM CHLORIDE 0.45 % IV SOLN: INTRAVENOUS | Status: AC

## 2012-06-12 MED ORDER — ACETAMINOPHEN 325 MG PO TABS
650.0000 mg | ORAL_TABLET | ORAL | Status: DC | PRN
  Administered 2012-06-12: 650 mg via ORAL

## 2012-06-12 NOTE — Discharge Summary (Signed)
CARDIOLOGY DISCHARGE SUMMARY   Patient ID: Jeff [name] MRN: [id number] DOB/AGE: 11/29/1965 47 y.o.  Admit date: 06/11/2012 Discharge date: 06/12/2012  Primary Discharge Diagnosis:   *Acute non-ST segment elevation myocardial infarction  Secondary Discharge Diagnosis:   Chest pain, mid sternal  Hyperlipidemia  Procedures: Left Heart Cath, Selective Coronary Angiography, LV angiography  Hospital Course:  Jeff [name] is a 47 year old male with no history of CAD. He had intermittent chest pain with a prolonged episode the day of admission. He came to the ER where his troponin was elevated. He was admitted for further evaluation and treatment.   His cardiac enzymes showed a slight crescendo/decrescendo pattern. His pain was controlled on medical therapy. His ECG had no abnormalities. He was taken to the cath lab on 1/28, with results listed below. There was no significant CAD and his EF was normal. Post-cath, his groin was without hematoma or ecchymosis. Dr Eden Emms considered him stable for discharge, to follow up as an outpatient with his primary MD.  Labs:   Lab Results  Component Value Date   WBC 8.1 06/12/2012   HGB 14.0 06/12/2012   HCT 42.6 06/12/2012   MCV 88.4 06/12/2012   PLT 133* 06/12/2012     Lab 06/11/12 1759  NA 135  K 4.2  CL 96  CO2 28  BUN 7  CREATININE 0.99  CALCIUM 9.2  PROT --  BILITOT --  ALKPHOS --  ALT --  AST --  GLUCOSE 83    Basename 06/12/12 0637 06/12/12 0128 06/11/12 1911  CKTOTAL -- -- 225  CKMB -- -- 5.6*  CKMBINDEX -- -- --  TROPONINI 0.53* 1.66* 0.65*   Lipid Panel     Component Value Date/Time   CHOL 107 06/12/2012 0637   TRIG 93 06/12/2012 0637   HDL 43 06/12/2012 0637   CHOLHDL 2.5 06/12/2012 0637   VLDL 19 06/12/2012 0637   LDLCALC 45 06/12/2012 0637    Basename 06/12/12 0637  INR 1.05      Radiology: Dg Chest 2 View 06/11/2012  *RADIOLOGY REPORT*  Clinical Data: Chest pain  CHEST - 2 VIEW  Comparison: 09/01/2010;  07/30/2010; 12/01/2009  Findings: Grossly unchanged cardiac silhouette and mediastinal contours given slightly decreased lung volumes.  No focal airspace opacity.  No pleural effusion or pneumothorax.  Post right-sided rotator cuff repair.  No acute osseous abnormalities.  IMPRESSION: No acute cardiopulmonary disease.   Original Report Authenticated By: Tacey Ruiz, MD     Cardiac Cath: 06/12/2012 Left mainstem: Normal  Left anterior descending (LAD): Normal  D1: large and normal  D2: Patient has 3 very small diagonals coming of the mid and distal LAD normal  Left circumflex (LCx): normal  OM1: normal  OM2: normal  Right coronary artery (RCA): Dominant and normal  PDA: normal  PLA: normal  Left ventriculography: Left ventricular systolic function is normal, LVEF is estimated at 55-65%, there is no significant mitral regurgitation  Final Conclusions: No significant CAD  Recommendations: D/C home today Radial lounge F/U Primary care doctor   EKG:12-Jun-2012 04:31:00 [facility] ROUTINE RECORD Sinus bradycardia with sinus arrhythmia Otherwise normal ECG 37mm/s 81mm/mV 100Hz  8.0.1 12SL 241 HD CID: 1 Referred by: Unconfirmed Vent. rate 59 BPM PR interval 152 ms QRS duration 92 ms QT/QTc 426/421 ms P-R-T axes 51 32 42  FOLLOW UP PLANS AND APPOINTMENTS No Known Allergies   Medication List     As of 06/12/2012  2:53 PM  TAKE these medications         atorvastatin 40 MG tablet   Commonly known as: LIPITOR   Take 40 mg by mouth daily.      levothyroxine 88 MCG tablet   Commonly known as: SYNTHROID, LEVOTHROID   Take 88 mcg by mouth daily.      magnesium oxide 400 MG tablet   Commonly known as: MAG-OX   Take 400 mg by mouth daily.      metoprolol 50 MG tablet   Commonly known as: LOPRESSOR   Take 50 mg by mouth 2 (two) times daily.      oxyCODONE-acetaminophen 10-325 MG per tablet   Commonly known as: PERCOCET   Take 1 tablet by mouth every 6 (six)  hours as needed. For pain         Discharge Orders    Future Appointments: Provider: Department: Dept Phone: Center:   06/20/2012 7:45 AM Romero Belling, MD [facility] [phone] None       BRING ALL MEDICATIONS WITH YOU TO FOLLOW UP APPOINTMENTS  Time spent with patient to include physician time:  min Signed: Theodore Demark 06/12/2012, 2:53 PM Co-Sign MD

## 2012-06-12 NOTE — Progress Notes (Signed)
ANTICOAGULATION CONSULT NOTE Pharmacy Consult for [facility] Indication: NSTEMI  No Known Allergies  Patient Measurements: Height: 5\' 10"  (177.8 cm) Weight: 247 lb 12.8 oz (112.401 kg) IBW/kg (Calculated) : 73  Heparin Dosing Weight: 98kg  Vital Signs: Temp: 98.1 F (36.7 C) (01/27 2239) Temp src: Oral (01/27 2239) BP: 146/96 mmHg (01/27 2239) Pulse Rate: 79  (01/27 2239)  Labs:  Basename 06/12/12 0128 06/11/12 1911 06/11/12 1759  HGB -- -- 16.0  HCT -- -- 48.2  PLT -- -- 155  APTT -- -- --  LABPROT -- -- --  INR -- -- --  HEPARINUNFRC 0.42 -- --  CREATININE -- -- 0.99  CKTOTAL -- 225 --  CKMB -- 5.6* --  TROPONINI -- 0.65* --    Estimated Creatinine Clearance: 118.4 ml/min (by C-G formula based on Cr of 0.99).  Assessment: 47 y/o male with chest pain for heparin  Goal of Therapy:  Heparin level 0.3-0.7 units/ml Monitor platelets by anticoagulation protocol: Yes   Plan:  Continue Heparin at current rate  Geannie Risen, PharmD, BCPS 06/12/2012,2:08 AM

## 2012-06-12 NOTE — Progress Notes (Signed)
TR BAND REMOVAL  LOCATION:    right radial  DEFLATED PER PROTOCOL:    yes  TIME BAND OFF / DRESSING APPLIED:    1515   SITE UPON ARRIVAL:    Level 0  SITE AFTER BAND REMOVAL:    Level 0  REVERSE ALLEN'S TEST:     positive  CIRCULATION SENSATION AND MOVEMENT:    Within Normal Limits   yes  COMMENTS:   Tolerated  Procedure well

## 2012-06-12 NOTE — Progress Notes (Signed)
Utilization Review Completed.   Makenleigh Crownover, RN, BSN Nurse Case Manager  [phone]  

## 2012-06-12 NOTE — CV Procedure (Signed)
   Cardiac Catheterization Procedure Note  Name: Jeff [name] MRN: [id number] DOB: [birth year]  Procedure: Left Heart Cath, Selective Coronary Angiography, LV angiography  Indication:  Chest Pain positive troponin   Procedural Details: The right wrist was prepped, draped, and anesthetized with 1% lidocaine. Using the modified Seldinger technique, a 5 French sheath was introduced into the right radial artery. 3 mg of verapamil was administered through the sheath, weight-based unfractionated heparin was administered intravenously. Standard Judkins catheters were used for selective coronary angiography and left ventriculography. Catheter exchanges were performed over an exchange length guidewire. There were no immediate procedural complications. A TR band was used for radial hemostasis at the completion of the procedure.  The patient was transferred to the post catheterization recovery area for further monitoring.  14cc placed in radial band at 11:15am  Procedural Findings: Hemodynamics: AO 144 / 86 mmHg LV  140 / 14 mmHg  Coronary angiography: Coronary dominance: right  Left mainstem: Normal  Left anterior descending (LAD):  Normal   D1: large and normal  D2:  Patient has 3 very small diagonals coming of the mid and distal LAD normal  Left circumflex (LCx): normal   OM1: normal  OM2: normal  Right coronary artery (RCA):  Dominant and normal   PDA: normal  PLA: normal  Left ventriculography: Left ventricular systolic function is normal, LVEF is estimated at 55-65%, there is no significant mitral regurgitation   Final Conclusions:  No significant CAD  Recommendations:  D/C home today Radial lounge F/U Primary care doctor  Charlton Haws 06/12/2012, 11:12 AM

## 2012-06-12 NOTE — Progress Notes (Signed)
Subjective:  Feels well this am.  No further chest pain. Troponins trending down.Marland Kitchen Awaiting cath. EKG shows no ischemia.  Objective:  Vital Signs in the last 24 hours: Temp:  [98.1 F (36.7 C)-98.5 F (36.9 C)] 98.5 F (36.9 C) (01/28 0527) Pulse Rate:  [68-86] 68  (01/28 0527) Resp:  [13-20] 18  (01/28 0527) BP: (109-171)/(67-105) 109/67 mmHg (01/28 0527) SpO2:  [98 %-100 %] 99 % (01/28 0527) Weight:  [247 lb 12.8 oz (112.4 kg)-255 lb 1.2 oz (115.7 kg)] 247 lb 12.8 oz (112.4 kg) (01/28 0527)  Intake/Output from previous day: 01/27 0701 - 01/28 0700 In: 667.3 [I.V.:667.3] Out: 275 [Urine:275] Intake/Output from this shift:       . aspirin EC  81 mg Oral Daily  . atorvastatin  40 mg Oral Daily  . diazepam  5 mg Oral On Call  . levothyroxine  88 mcg Oral QAC breakfast  . magnesium oxide  400 mg Oral Daily  . metoprolol  50 mg Oral BID  . sodium chloride  3 mL Intravenous Q12H      . sodium chloride 1 mL/kg/hr (06/12/12 0431)  . heparin 1,400 Units/hr (06/11/12 1932)    Physical Exam: The patient appears to be in no distress.  Head and neck exam reveals that the pupils are equal and reactive.  The extraocular movements are full.  There is no scleral icterus.  Mouth and pharynx are benign.  No lymphadenopathy.  No carotid bruits.  The jugular venous pressure is normal.  Thyroid is not enlarged or tender.  Chest is clear to percussion and auscultation.  No rales or rhonchi.  Expansion of the chest is symmetrical.  Heart reveals no abnormal lift or heave.  First and second heart sounds are normal.  There is no murmur gallop rub or click.  The abdomen is soft and nontender.  Bowel sounds are normoactive.  There is no hepatosplenomegaly or mass.  There are no abdominal bruits.  Extremities reveal no phlebitis or edema.  Pedal pulses are good.  There is no cyanosis or clubbing.  Neurologic exam is normal strength and no lateralizing weakness.  No sensory  deficits.  Integument reveals no rash  Lab Results:  Basename 06/12/12 0637 06/11/12 1759  WBC 8.1 9.8  HGB 14.0 16.0  PLT 133* 155    Basename 06/11/12 1759  NA 135  K 4.2  CL 96  CO2 28  GLUCOSE 83  BUN 7  CREATININE 0.99    Basename 06/12/12 0637 06/12/12 0128  TROPONINI 0.53* 1.66*   Hepatic Function Panel No results found for this basename: PROT,ALBUMIN,AST,ALT,ALKPHOS,BILITOT,BILIDIR,IBILI in the last 72 hours  Basename 06/12/12 0637  CHOL 107   No results found for this basename: PROTIME in the last 72 hours  Imaging: Dg Chest 2 View  06/11/2012  *RADIOLOGY REPORT*  Clinical Data: Chest pain  CHEST - 2 VIEW  Comparison: 09/01/2010; 07/30/2010; 12/01/2009  Findings: Grossly unchanged cardiac silhouette and mediastinal contours given slightly decreased lung volumes.  No focal airspace opacity.  No pleural effusion or pneumothorax.  Post right-sided rotator cuff repair.  No acute osseous abnormalities.  IMPRESSION: No acute cardiopulmonary disease.   Original Report Authenticated By: Tacey Ruiz, MD     Cardiac Studies: EKG shows NSR Assessment/Plan:  Patient Active Hospital Problem List: Acute non-ST segment elevation myocardial infarction (06/11/2012)   Assessment: Enzymes trending down   Plan: NPO for cath today. Hyperlipidemia (06/11/2012)   Assessment: LDL 45   Plan: continue statin  LOS: 1 day    Cassell Clement 06/12/2012, 8:04 AM

## 2012-06-12 NOTE — Interval H&P Note (Signed)
History and Physical Interval Note:  06/12/2012 9:41 AM  Jeff [name]  has presented today for surgery, with the diagnosis of cp  The various methods of treatment have been discussed with the patient and family. After consideration of risks, benefits and other options for treatment, the patient has consented to  Procedure(s) (LRB) with comments: LEFT HEART CATHETERIZATION WITH CORONARY ANGIOGRAM (N/A) as a surgical intervention .  The patient's history has been reviewed, patient examined, no change in status, stable for surgery.  I have reviewed the patient's chart and labs.  Questions were answered to the patient's satisfaction.     Charlton Haws

## 2012-06-20 ENCOUNTER — Ambulatory Visit: Payer: Medicaid Other | Admitting: Endocrinology

## 2012-06-21 ENCOUNTER — Ambulatory Visit (INDEPENDENT_AMBULATORY_CARE_PROVIDER_SITE_OTHER): Payer: Medicaid Other | Admitting: Endocrinology

## 2012-06-21 ENCOUNTER — Encounter: Payer: Self-pay | Admitting: Endocrinology

## 2012-06-21 VITALS — BP 128/74 | HR 70 | Wt 255.0 lb

## 2012-06-21 DIAGNOSIS — E890 Postprocedural hypothyroidism: Secondary | ICD-10-CM

## 2012-06-21 NOTE — Patient Instructions (Addendum)
Please continue the same thyroid medication. Please return in 1 year.

## 2012-06-21 NOTE — Progress Notes (Signed)
  Subjective:    Patient ID: Jeff [name], male    DOB: 10/26/1965, 47 y.o.   MRN: [id number]  HPI In early 2012, pt had i-131 rx for hyperthyroidism, due to grave's dz vs multinodular goiter vs both. He is now on synthroid 88 mcg/day.  pt was seen in the hospital last week with chest pain.   Review of Systems Denies weight change    Objective:   Physical Exam VITAL SIGNS:  See vs page GENERAL: no distress NECK: There is no palpable thyroid enlargement.  No thyroid nodule is palpable.  No palpable lymphadenopathy at the anterior neck.   Lab Results  Component Value Date   TSH 1.820 06/11/2012      Assessment & Plan:

## 2012-06-28 ENCOUNTER — Ambulatory Visit: Payer: Medicaid Other | Admitting: Physician Assistant

## 2012-07-03 ENCOUNTER — Ambulatory Visit: Payer: Medicaid Other | Admitting: Physician Assistant

## 2012-07-17 ENCOUNTER — Encounter: Payer: Self-pay | Admitting: Physician Assistant

## 2012-07-17 ENCOUNTER — Ambulatory Visit (INDEPENDENT_AMBULATORY_CARE_PROVIDER_SITE_OTHER): Payer: Medicaid Other | Admitting: Physician Assistant

## 2012-07-17 VITALS — BP 112/78 | HR 69 | Ht 70.0 in | Wt 254.1 lb

## 2012-07-17 DIAGNOSIS — I214 Non-ST elevation (NSTEMI) myocardial infarction: Secondary | ICD-10-CM

## 2012-07-17 DIAGNOSIS — I10 Essential (primary) hypertension: Secondary | ICD-10-CM

## 2012-07-17 DIAGNOSIS — E785 Hyperlipidemia, unspecified: Secondary | ICD-10-CM

## 2012-07-17 MED ORDER — AMLODIPINE BESYLATE 2.5 MG PO TABS: 2.5000 mg | ORAL_TABLET | Freq: Every day | ORAL | Status: DC

## 2012-07-17 MED ORDER — METOPROLOL TARTRATE 50 MG PO TABS: 25.0000 mg | ORAL_TABLET | Freq: Two times a day (BID) | ORAL | Status: DC

## 2012-07-17 NOTE — Patient Instructions (Addendum)
DECREASE METOPROLOL TO 25 MG TWICE DAILY (THIS WILL BE 1/2 TAB TWICE DAILY)  START AMLODIPINE 2.5 MG DAILY  YOU HAVE A FOLLOW UP APPT WITH SCOTT WEAVER, PAC ON 09/03/12 @ 3:40 SAME DAY WITH DR. Patty Sermons IN THE OFFICE

## 2012-07-17 NOTE — Progress Notes (Signed)
[address] [place], [place]  [address] Phone: [phone], Fax:  [phone]  Date:  07/17/2012   ID:  Jeff [name], DOB 1966-02-20, MRN [id number]  PCP:  Abigail Miyamoto, MD  Primary Cardiologist:  Dr. Cassell Clement     History of Present Illness: Jeff [name] is a 47 y.o. male who returns for follow up after a recent admission to the hospital for a NSTEMI.  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  He was admitted 1/27-1/28 with chest pain.  Troponins were abnormal (0.66 => 1.66).  He underwent LHC that demonstrated no CAD and EF 55-65% and normal wall motion.  Since d/c, he has done well.  He notes occasional left chest pain that is dull with some activities that improves with rest.  No significant DOE.  No syncope.  No orthopnea, PND, edema.    Labs (1/14):  K 4.2, creatinine 0.99, Hgb 14, LDL 45, DDimer < 0.27, TSH 1.820  Wt Readings from Last 3 Encounters:  07/17/12 254 lb 1.9 oz (115.268 kg)  06/21/12 255 lb (115.667 kg)  06/12/12 247 lb 12.8 oz (112.4 kg)     Past Medical History  Diagnosis Date  . HYPERTENSION 03/16/2010  . HYPERTHYROIDISM 03/16/2010    s/p RAI  . THYROID STORM 03/16/2010  . Altered mental status 03/16/2010  . Hypothyroidism following radioiodine therapy   . NSTEMI (non-ST elevated myocardial infarction)     05/2012:  LHC with normal EF and no CAD (? spasm)    Current Outpatient Prescriptions  Medication Sig Dispense Refill  . atorvastatin (LIPITOR) 40 MG tablet Take 40 mg by mouth daily.      Marland Kitchen levothyroxine (SYNTHROID, LEVOTHROID) 88 MCG tablet Take 88 mcg by mouth daily.      . magnesium oxide (MAG-OX) 400 MG tablet Take 400 mg by mouth daily.        . metoprolol (LOPRESSOR) 50 MG tablet Take 50 mg by mouth 2 (two) times daily.      Marland Kitchen oxyCODONE-acetaminophen (PERCOCET) 10-325 MG per tablet Take 1 tablet by mouth every 6 (six) hours as needed. For pain        No current facility-administered medications for this visit.    Allergies:   No Known Allergies  Social History:  The patient  reports that he has never smoked. He does not have any smokeless tobacco history on file. He reports that  drinks alcohol.   ROS:  Please see the history of present illness.     All other systems reviewed and negative.   PHYSICAL EXAM: VS:  BP 112/78  Pulse 69  Ht 5\' 10"  (1.778 m)  Wt 254 lb 1.9 oz (115.268 kg)  BMI 36.46 kg/m2 Well nourished, well developed, in no acute distress HEENT: normal Neck: no JVD Cardiac:  normal S1, S2; RRR; no murmur Lungs:  clear to auscultation bilaterally, no wheezing, rhonchi or rales Abd: soft, nontender, no hepatomegaly Ext: no edema; right wrist without hematoma or bruit  Skin: warm and dry Neuro:  CNs 2-12 intact, no focal abnormalities noted  EKG:  NSR, HR 71, normal axis, NSSTTW changes     ASSESSMENT AND PLAN:  1. NSTEMI:  Etiology not entirely clear.  He had normal cors at cath with normal LVF.  Question if he had vasospasm.  Still has occasional CP.  Somewhat atypical CP.  Will reduce Metoprolol to 25 mg bid and add  low dose amlodipine at 2.5 mg QD to reduce vasospasm.   2. Hypertension:  Controlled.  Continue current therapy.  3. Hyperlipidemia:  Managed by PCP.  4. Disposition:  Follow up with me in 6-8 weeks.    Luna Glasgow, PA-C  12:26 PM 07/17/2012

## 2012-08-24 ENCOUNTER — Emergency Department (HOSPITAL_COMMUNITY): Payer: No Typology Code available for payment source

## 2012-08-24 ENCOUNTER — Emergency Department (HOSPITAL_COMMUNITY)
Admission: EM | Admit: 2012-08-24 | Discharge: 2012-08-24 | Disposition: A | Discharge status: Home / Self Care | Payer: No Typology Code available for payment source | Attending: Emergency Medicine | Admitting: Emergency Medicine

## 2012-08-24 ENCOUNTER — Encounter (HOSPITAL_COMMUNITY): Payer: Self-pay | Admitting: Emergency Medicine

## 2012-08-24 DIAGNOSIS — IMO0002 Reserved for concepts with insufficient information to code with codable children: Secondary | ICD-10-CM | POA: Insufficient documentation

## 2012-08-24 DIAGNOSIS — Z79899 Other long term (current) drug therapy: Secondary | ICD-10-CM | POA: Insufficient documentation

## 2012-08-24 DIAGNOSIS — Y9389 Activity, other specified: Secondary | ICD-10-CM | POA: Insufficient documentation

## 2012-08-24 DIAGNOSIS — M549 Dorsalgia, unspecified: Secondary | ICD-10-CM

## 2012-08-24 DIAGNOSIS — E039 Hypothyroidism, unspecified: Secondary | ICD-10-CM | POA: Insufficient documentation

## 2012-08-24 DIAGNOSIS — I10 Essential (primary) hypertension: Secondary | ICD-10-CM | POA: Insufficient documentation

## 2012-08-24 DIAGNOSIS — S0993XA Unspecified injury of face, initial encounter: Secondary | ICD-10-CM | POA: Insufficient documentation

## 2012-08-24 DIAGNOSIS — M542 Cervicalgia: Secondary | ICD-10-CM

## 2012-08-24 DIAGNOSIS — E0590 Thyrotoxicosis, unspecified without thyrotoxic crisis or storm: Secondary | ICD-10-CM | POA: Insufficient documentation

## 2012-08-24 DIAGNOSIS — I252 Old myocardial infarction: Secondary | ICD-10-CM | POA: Insufficient documentation

## 2012-08-24 DIAGNOSIS — Y92410 Unspecified street and highway as the place of occurrence of the external cause: Secondary | ICD-10-CM | POA: Insufficient documentation

## 2012-08-24 MED ORDER — CYCLOBENZAPRINE HCL 10 MG PO TABS: 10.0000 mg | ORAL_TABLET | Freq: Two times a day (BID) | ORAL | Status: DC | PRN

## 2012-08-24 MED ORDER — IBUPROFEN 800 MG PO TABS: 800.0000 mg | ORAL_TABLET | Freq: Three times a day (TID) | ORAL | Status: DC

## 2012-08-24 MED ORDER — CYCLOBENZAPRINE HCL 10 MG PO TABS
10.0000 mg | ORAL_TABLET | Freq: Once | ORAL | Status: AC
  Administered 2012-08-24: 10 mg via ORAL
  Filled 2012-08-24: qty 1

## 2012-08-24 MED ORDER — HYDROMORPHONE HCL PF 1 MG/ML IJ SOLN
1.0000 mg | Freq: Once | INTRAMUSCULAR | Status: AC
  Administered 2012-08-24: 1 mg via INTRAMUSCULAR
  Filled 2012-08-24: qty 1
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
## 2012-08-25 NOTE — ED Provider Notes (Signed)
I saw and evaluated the patient, reviewed the resident's note and I agree with the findings and plan.   Shelda Jakes, MD 08/25/12 2011

## 2012-09-03 ENCOUNTER — Ambulatory Visit: Payer: Medicaid Other | Admitting: Physician Assistant

## 2012-09-03 ENCOUNTER — Encounter: Payer: Self-pay | Admitting: *Deleted

## 2012-10-04 ENCOUNTER — Encounter: Payer: Self-pay | Admitting: Physician Assistant

## 2012-10-04 ENCOUNTER — Ambulatory Visit (INDEPENDENT_AMBULATORY_CARE_PROVIDER_SITE_OTHER): Payer: Medicaid Other | Admitting: Physician Assistant

## 2012-10-04 VITALS — BP 126/96 | HR 73 | Ht 70.0 in | Wt 244.4 lb

## 2012-10-04 DIAGNOSIS — I10 Essential (primary) hypertension: Secondary | ICD-10-CM

## 2012-10-04 DIAGNOSIS — R072 Precordial pain: Secondary | ICD-10-CM

## 2012-10-04 DIAGNOSIS — I252 Old myocardial infarction: Secondary | ICD-10-CM

## 2012-10-04 DIAGNOSIS — I201 Angina pectoris with documented spasm: Secondary | ICD-10-CM

## 2012-10-04 MED ORDER — NITROGLYCERIN 0.4 MG SL SUBL: 0.4000 mg | SUBLINGUAL_TABLET | SUBLINGUAL | Status: DC | PRN

## 2012-10-04 MED ORDER — AMLODIPINE BESYLATE 5 MG PO TABS: 5.0000 mg | ORAL_TABLET | Freq: Every day | ORAL | Status: DC

## 2012-10-04 NOTE — Patient Instructions (Addendum)
Your physician has recommended you make the following change in your medication:  1.) Increase Norvasc ( 5 mg) daily you may take two of your ( 2.5 mg) tablets till medication is all used up 2. Start Nitro if needed, sent both prescriptions into your pharmacy  You have been referred to [facility] for your PCP  .Your physician wants you to follow-up in: 6 months with Dr. Patty Sermons.  You will receive a reminder letter in the mail two months in advance. If you don't receive a letter, please call our office to schedule the follow-up appointment.  2 Gram Low Sodium Diet A 2 gram sodium diet restricts the amount of sodium in the diet to no more than 2 g or 2000 mg daily. Limiting the amount of sodium is often used to help lower blood pressure. It is important if you have heart, liver, or kidney problems. Many foods contain sodium for flavor and sometimes as a preservative. When the amount of sodium in a diet needs to be low, it is important to know what to look for when choosing foods and drinks. The following includes some information and guidelines to help make it easier for you to adapt to a low sodium diet. QUICK TIPS  Do not add salt to food.  Avoid convenience items and fast food.  Choose unsalted snack foods.  Buy lower sodium products, often labeled as "lower sodium" or "no salt added."  Check food labels to learn how much sodium is in 1 serving.  When eating at a restaurant, ask that your food be prepared with less salt or none, if possible. READING FOOD LABELS FOR SODIUM INFORMATION The nutrition facts label is a good place to find how much sodium is in foods. Look for products with no more than 500 to 600 mg of sodium per meal and no more than 150 mg per serving. Remember that 2 g = 2000 mg. The food label may also list foods as:  Sodium-free: Less than 5 mg in a serving.  Very low sodium: 35 mg or less in a serving.  Low-sodium: 140 mg or less in a serving.  Light in sodium: 50%  less sodium in a serving. For example, if a food that usually has 300 mg of sodium is changed to become light in sodium, it will have 150 mg of sodium.  Reduced sodium: 25% less sodium in a serving. For example, if a food that usually has 400 mg of sodium is changed to reduced sodium, it will have 300 mg of sodium. CHOOSING FOODS Grains  Avoid: Salted crackers and snack items. Some cereals, including instant hot cereals. Bread stuffing and biscuit mixes. Seasoned rice or pasta mixes.  Choose: Unsalted snack items. Low-sodium cereals, oats, puffed wheat and rice, shredded wheat. English muffins and bread. Pasta. Meats  Avoid: Salted, canned, smoked, spiced, pickled meats, including fish and poultry. Bacon, ham, sausage, cold cuts, hot dogs, anchovies.  Choose: Low-sodium canned tuna and salmon. Fresh or frozen meat, poultry, and fish. Dairy  Avoid: Processed cheese and spreads. Cottage cheese. Buttermilk and condensed milk. Regular cheese.  Choose: Milk. Low-sodium cottage cheese. Yogurt. Sour cream. Low-sodium cheese. Fruits and Vegetables  Avoid: Regular canned vegetables. Regular canned tomato sauce and paste. Frozen vegetables in sauces. Olives. Rosita Fire. Relishes. Sauerkraut.  Choose: Low-sodium canned vegetables. Low-sodium tomato sauce and paste. Frozen or fresh vegetables. Fresh and frozen fruit. Condiments  Avoid: Canned and packaged gravies. Worcestershire sauce. Tartar sauce. Barbecue sauce. Soy sauce. Steak sauce. Ketchup.  Onion, garlic, and table salt. Meat flavorings and tenderizers.  Choose: Fresh and dried herbs and spices. Low-sodium varieties of mustard and ketchup. Lemon juice. Tabasco sauce. Horseradish. SAMPLE 2 GRAM SODIUM MEAL PLAN Breakfast / Sodium (mg)  1 cup low-fat milk / 143 mg  2 slices whole-wheat toast / 270 mg  1 tbs heart-healthy margarine / 153 mg  1 hard-boiled egg / 139 mg  1 small orange / 0 mg Lunch / Sodium (mg)  1 cup raw carrots / 76  mg   cup hummus / 298 mg  1 cup low-fat milk / 143 mg   cup red grapes / 2 mg  1 whole-wheat pita bread / 356 mg Dinner / Sodium (mg)  1 cup whole-wheat pasta / 2 mg  1 cup low-sodium tomato sauce / 73 mg  3 oz lean ground beef / 57 mg  1 small side salad (1 cup raw spinach leaves,  cup cucumber,  cup yellow bell pepper) with 1 tsp olive oil and 1 tsp red wine vinegar / 25 mg Snack / Sodium (mg)  1 container low-fat vanilla yogurt / 107 mg  3 graham cracker squares / 127 mg Nutrient Analysis  Calories: 2033  Protein: 77 g  Carbohydrate: 282 g  Fat: 72 g  Sodium: 1971 mg Document Released: 05/02/2005 Document Revised: 07/25/2011 Document Reviewed: 08/03/2009 [employer] Patient Information 2014 [place], [place].

## 2012-10-04 NOTE — Progress Notes (Signed)
[address] [place], [place]  [address] Phone: [phone], Fax:  [phone]  Date:  10/04/2012   ID:  Jeff [name], DOB 05/24/1965, MRN [id number]  PCP:  Abigail Miyamoto, MD  Primary Cardiologist:  Dr. Cassell Clement     History of Present Illness: Jeff [name] is a 47 y.o. male who returns for follow up.  He was admitted in 05/2012 with CP and abnormal troponins ruling him in for a NSTEMI.  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   He underwent LHC that demonstrated no CAD and EF 55-65% and normal wall motion.  I saw him in 07/2012 in follow up. I added amlodipine to cover for possible coronary vasospasm.  Since last seen, he is doing well. Denies chest pain.  Denies significant shortness of breath. Denies syncope. Denies orthopnea, PND or significant edema. No palpitations.  Labs (1/14):  K 4.2, creatinine 0.99, Hgb 14, LDL 45, DDimer < 0.27, TSH 1.820  Wt Readings from Last 3 Encounters:  10/04/12 244 lb 6.4 oz (110.859 kg)  07/17/12 254 lb 1.9 oz (115.268 kg)  06/21/12 255 lb (115.667 kg)     Past Medical History  Diagnosis Date  . HYPERTENSION 03/16/2010  . HYPERTHYROIDISM 03/16/2010    s/p RAI  . THYROID STORM 03/16/2010  . Altered mental status 03/16/2010  . Hypothyroidism following radioiodine therapy   . NSTEMI (non-ST elevated myocardial infarction)     05/2012:  LHC with normal EF and no CAD (? spasm)    Current Outpatient Prescriptions  Medication Sig Dispense Refill  . amLODipine (NORVASC) 2.5 MG tablet Take 1 tablet (2.5 mg total) by mouth daily.  30 tablet  11  . atorvastatin (LIPITOR) 40 MG tablet Take 40 mg by mouth at bedtime.       Marland Kitchen levothyroxine (SYNTHROID, LEVOTHROID) 88 MCG tablet Take 88 mcg by mouth daily.      . magnesium oxide (MAG-OX) 400 MG tablet Take 400 mg by mouth daily.        . metoprolol (LOPRESSOR) 50 MG tablet Take 0.5 tablets (25 mg total)  by mouth 2 (two) times daily.      Marland Kitchen oxyCODONE-acetaminophen (PERCOCET) 10-325 MG per tablet Take 1 tablet by mouth every 6 (six) hours as needed. For pain       No current facility-administered medications for this visit.    Allergies:   No Known Allergies  Social History:  The patient  reports that he has never smoked. He does not have any smokeless tobacco history on file. He reports that  drinks alcohol.   ROS:  Please see the history of present illness.     All other systems reviewed and negative.   PHYSICAL EXAM: VS:  BP 126/96  Pulse 73  Ht 5\' 10"  (1.778 m)  Wt 244 lb 6.4 oz (110.859 kg)  BMI 35.07 kg/m2 Well nourished, well developed, in no acute distress HEENT: normal Neck: no JVD Cardiac:  normal S1, S2; RRR; no murmur Lungs:  clear to auscultation bilaterally, no wheezing, rhonchi or rales Abd: soft, nontender, no hepatomegaly Ext: no edema Skin: warm and dry Neuro:  CNs 2-12 intact, no focal abnormalities noted  EKG:  NSR, HR 73, normal axis, NSSTTW changes     ASSESSMENT AND PLAN:  1. NSTEMI:  Probable coronary vasospasm. He tells me he was using a friend's testosterone gel around the time of his MI.  I have advised him against this.  No further chest pain. Continue amlodipine. We'll also give him a prescription for when necessary nitroglycerin.   2. Hypertension:  Uncontrolled. Adjust amlodipine to 5 mg daily. We discussed limiting his salt. His wife will monitor his BP and call if it remains elevated.  3. Hyperlipidemia:  Managed by PCP.  4. Disposition:  Follow up with Dr. Patty Sermons in 6 months.    Signed, Tereso Newcomer, PA-C  9:22 AM 10/04/2012

## 2012-10-23 ENCOUNTER — Telehealth: Payer: Self-pay

## 2012-10-23 ENCOUNTER — Encounter: Payer: Self-pay | Admitting: Endocrinology

## 2012-10-23 ENCOUNTER — Ambulatory Visit (INDEPENDENT_AMBULATORY_CARE_PROVIDER_SITE_OTHER): Payer: Medicaid Other | Admitting: Endocrinology

## 2012-10-23 VITALS — BP 136/78 | HR 78 | Ht 70.0 in | Wt 242.0 lb

## 2012-10-23 DIAGNOSIS — H538 Other visual disturbances: Secondary | ICD-10-CM

## 2012-10-23 DIAGNOSIS — E890 Postprocedural hypothyroidism: Secondary | ICD-10-CM

## 2012-10-23 LAB — T4, FREE: Free T4: 0.96 ng/dL (ref 0.60–1.60)

## 2012-10-23 LAB — TSH: TSH: 1.53 u[IU]/mL (ref 0.35–5.50)

## 2012-10-23 NOTE — Telephone Encounter (Signed)
Ok, please advise pt of this 

## 2012-10-23 NOTE — Patient Instructions (Addendum)
blood tests are being requested for you today.  We'll contact you with results.   Refer to an eye doctor.  you will receive a phone call, about a day and time for an appointment Please return in 1 year.

## 2012-10-23 NOTE — Telephone Encounter (Signed)
Pt advised.

## 2012-10-23 NOTE — Telephone Encounter (Signed)
[facility] at [facility] called pt has [place] access we do not participate with [place] access, therefore referral can not be done.

## 2012-10-23 NOTE — Progress Notes (Signed)
Subjective:    Patient ID: Jeff [name], male    DOB: 1965/11/12, 47 y.o.   MRN: [id number]  HPI In early 2012, pt had i-131 rx for hyperthyroidism, due to grave's dz vs multinodular goiter vs both. He is now on synthroid 88 mcg/day.   Pt states few mos of slight blurry vision from both eyes, and assoc anxiety.   Past Medical History  Diagnosis Date  . HYPERTENSION 03/16/2010  . HYPERTHYROIDISM 03/16/2010    s/p RAI  . THYROID STORM 03/16/2010  . Altered mental status 03/16/2010  . Hypothyroidism following radioiodine therapy   . NSTEMI (non-ST elevated myocardial infarction)     05/2012:  LHC with normal EF and no CAD (? spasm)    Past Surgical History  Procedure Laterality Date  . Back surgery  03/2009, 10/2009    History   Social History  . Marital Status: Married    Spouse Name: N/A    Number of Children: N/A  . Years of Education: N/A   Occupational History  . Disabled    Social History Main Topics  . Smoking status: Never Smoker   . Smokeless tobacco: Not on file  . Alcohol Use: Yes  . Drug Use:   . Sexually Active:    Other Topics Concern  . Not on file   Social History Narrative   Regular exercise-no   Disabled to due back problem    Current Outpatient Prescriptions on File Prior to Visit  Medication Sig Dispense Refill  . amLODipine (NORVASC) 5 MG tablet Take 1 tablet (5 mg total) by mouth daily.  30 tablet  11  . atorvastatin (LIPITOR) 40 MG tablet Take 40 mg by mouth at bedtime.       Marland Kitchen levothyroxine (SYNTHROID, LEVOTHROID) 88 MCG tablet Take 88 mcg by mouth daily.      . magnesium oxide (MAG-OX) 400 MG tablet Take 400 mg by mouth daily.        . metoprolol (LOPRESSOR) 50 MG tablet Take 0.5 tablets (25 mg total) by mouth 2 (two) times daily.      . nitroGLYCERIN (NITROSTAT) 0.4 MG SL tablet Place 1 tablet (0.4 mg total) under the tongue every 5 (five) minutes as needed for chest pain.  25 tablet  3  . oxyCODONE-acetaminophen (PERCOCET) 10-325 MG per  tablet Take 1 tablet by mouth every 6 (six) hours as needed. For pain       No current facility-administered medications on file prior to visit.    No Known Allergies  Family History  Problem Relation Age of Onset  . Thyroid disease Mother     hypothyroidism  . Arthritis Other   . Hypertension Other   . Heart disease Other     Parents, Grandparents  . Diabetes Other     Grandparents    BP 136/78  Pulse 78  Ht 5\' 10"  (1.778 m)  Wt 242 lb (109.77 kg)  BMI 34.72 kg/m2  SpO2 98%  Review of Systems He has lost a few lbs. He has a slight tremor.      Objective:   Physical Exam VITAL SIGNS:  See vs page GENERAL: no distress NECK: There is no palpable thyroid enlargement.  No thyroid nodule is palpable.  No palpable lymphadenopathy at the anterior neck.     Lab Results  Component Value Date   TSH 1.53 10/23/2012      Assessment & Plan:  Post-i-131 hypothyroidism, well-replaced Multinodular goiter, not evident of physical exam Blurry  vision, new, uncertain etiology

## 2012-11-14 ENCOUNTER — Other Ambulatory Visit: Payer: Self-pay | Admitting: Neurosurgery

## 2012-11-14 DIAGNOSIS — M542 Cervicalgia: Secondary | ICD-10-CM

## 2012-11-22 ENCOUNTER — Ambulatory Visit
Admission: RE | Admit: 2012-11-22 | Discharge: 2012-11-22 | Disposition: A | Discharge status: Home / Self Care | Payer: Medicaid Other | Source: Ambulatory Visit | Attending: Neurosurgery | Admitting: Neurosurgery

## 2012-11-22 DIAGNOSIS — M542 Cervicalgia: Secondary | ICD-10-CM

## 2012-12-05 ENCOUNTER — Other Ambulatory Visit: Payer: Self-pay | Admitting: Neurosurgery

## 2012-12-07 ENCOUNTER — Encounter (HOSPITAL_COMMUNITY): Payer: Self-pay

## 2012-12-07 ENCOUNTER — Inpatient Hospital Stay (HOSPITAL_COMMUNITY)
Admission: RE | Admit: 2012-12-07 | Discharge: 2012-12-11 | DRG: 473 | Disposition: A | Discharge status: Home / Self Care | Payer: Medicaid Other | Source: Ambulatory Visit | Attending: Neurosurgery | Admitting: Neurosurgery

## 2012-12-07 ENCOUNTER — Encounter (HOSPITAL_COMMUNITY)
Admission: RE | Admit: 2012-12-07 | Discharge: 2012-12-07 | Disposition: A | Discharge status: Home / Self Care | Payer: Medicaid Other | Source: Ambulatory Visit | Attending: Neurosurgery | Admitting: Neurosurgery

## 2012-12-07 DIAGNOSIS — I10 Essential (primary) hypertension: Secondary | ICD-10-CM | POA: Diagnosis present

## 2012-12-07 DIAGNOSIS — Y9210 Unspecified residential institution as the place of occurrence of the external cause: Secondary | ICD-10-CM | POA: Diagnosis present

## 2012-12-07 DIAGNOSIS — Y831 Surgical operation with implant of artificial internal device as the cause of abnormal reaction of the patient, or of later complication, without mention of misadventure at the time of the procedure: Secondary | ICD-10-CM | POA: Diagnosis present

## 2012-12-07 DIAGNOSIS — Z981 Arthrodesis status: Secondary | ICD-10-CM

## 2012-12-07 DIAGNOSIS — I252 Old myocardial infarction: Secondary | ICD-10-CM

## 2012-12-07 DIAGNOSIS — E039 Hypothyroidism, unspecified: Secondary | ICD-10-CM | POA: Diagnosis present

## 2012-12-07 DIAGNOSIS — S129XXD Fracture of neck, unspecified, subsequent encounter: Secondary | ICD-10-CM

## 2012-12-07 DIAGNOSIS — G4733 Obstructive sleep apnea (adult) (pediatric): Secondary | ICD-10-CM | POA: Diagnosis present

## 2012-12-07 DIAGNOSIS — T84498A Other mechanical complication of other internal orthopedic devices, implants and grafts, initial encounter: Principal | ICD-10-CM | POA: Diagnosis present

## 2012-12-07 DIAGNOSIS — K219 Gastro-esophageal reflux disease without esophagitis: Secondary | ICD-10-CM | POA: Diagnosis present

## 2012-12-07 HISTORY — DX: Gastro-esophageal reflux disease without esophagitis: K21.9

## 2012-12-07 LAB — TYPE AND SCREEN
ABO/RH(D): O POS
Antibody Screen: NEGATIVE

## 2012-12-07 LAB — CBC
HCT: 47.4 % (ref 39.0–52.0)
Hemoglobin: 15.9 g/dL (ref 13.0–17.0)
MCH: 29.3 pg (ref 26.0–34.0)
MCHC: 33.5 g/dL (ref 30.0–36.0)
MCV: 87.5 fL (ref 78.0–100.0)
Platelets: 163 10*3/uL (ref 150–400)
RBC: 5.42 MIL/uL (ref 4.22–5.81)
RDW: 13.8 % (ref 11.5–15.5)
WBC: 8.7 10*3/uL (ref 4.0–10.5)

## 2012-12-07 LAB — BASIC METABOLIC PANEL
BUN: 5 mg/dL — ABNORMAL LOW (ref 6–23)
CO2: 28 mEq/L (ref 19–32)
Calcium: 9.2 mg/dL (ref 8.4–10.5)
Chloride: 101 mEq/L (ref 96–112)
Creatinine, Ser: 1.09 mg/dL (ref 0.50–1.35)
GFR calc Af Amer: >90 mL/min (ref >90)
GFR calc non Af Amer: 80 mL/min — ABNORMAL LOW (ref >90)
Glucose, Bld: 104 mg/dL — ABNORMAL HIGH (ref 70–99)
Potassium: 4.6 mEq/L (ref 3.5–5.1)
Sodium: 139 mEq/L (ref 135–145)

## 2012-12-07 LAB — SURGICAL PCR SCREEN
MRSA, PCR: POSITIVE — AB
Staphylococcus aureus: POSITIVE — AB

## 2012-12-07 NOTE — Pre-Procedure Instructions (Addendum)
Jeff [name]  12/07/2012   Your procedure is scheduled on: 7.28.14  Report to [facility] at 1215 PM.  Call this number if you have problems the morning of surgery: [phone]   Remember:   Do not eat food or drink liquids after midnight.   Take these medicines the morning of surgery with A SIP OF WATER: amlodipine, levothryroxine,metoprolol, pain med   Do not wear jewelry, make-up or nail polish.  Do not wear lotions, powders, or perfumes. You may wear deodorant.  Do not shave 48 hours prior to surgery. Men may shave face and neck.  Do not bring valuables to the hospital.  [facility] is not responsible                   for any belongings or valuables.  Contacts, dentures or bridgework may not be worn into surgery.  Leave suitcase in the car. After surgery it may be brought to your room.  For patients admitted to the hospital, checkout time is 11:00 AM the day of  discharge.   Patients discharged the day of surgery will not be allowed to drive  home.  Name and phone number of your driver:   Special Instructions: Shower using CHG 2 nights before surgery and the night before surgery.  If you shower the day of surgery use CHG.  Use special wash - you have one bottle of CHG for all showers.  You should use approximately 1/3 of the bottle for each shower.   Please read over the following fact sheets that you were given: Pain Booklet, Coughing and Deep Breathing, Blood Transfusion Information, MRSA Information and Surgical Site Infection Prevention

## 2012-12-07 NOTE — Progress Notes (Addendum)
Note from s weaver pa/dr brackbill 5/14 in epic ,ekg 5/14 ,cxr 1/14

## 2012-12-07 NOTE — Progress Notes (Signed)
12/07/12 0835  OBSTRUCTIVE SLEEP APNEA  Have you ever been diagnosed with sleep apnea through a sleep study? No  Do you snore loudly (loud enough to be heard through closed doors)?  1  Do you often feel tired, fatigued, or sleepy during the daytime? 1  Has anyone observed you stop breathing during your sleep? 0  Do you have, or are you being treated for high blood pressure? 1  BMI more than 35 kg/m2? 0  Age over 46 years old? 0  Neck circumference greater than 40 cm/18 inches? 1 (19)  Gender: 1  Obstructive Sleep Apnea Score 5  Score 4 or greater  Results sent to PCP

## 2012-12-09 MED ORDER — CEFAZOLIN SODIUM-DEXTROSE 2-3 GM-% IV SOLR
2.0000 g | INTRAVENOUS | Status: AC
  Administered 2012-12-10: 2 g via INTRAVENOUS
  Filled 2012-12-09: qty 50

## 2012-12-10 ENCOUNTER — Ambulatory Visit (HOSPITAL_COMMUNITY): Payer: Medicaid Other

## 2012-12-10 ENCOUNTER — Encounter (HOSPITAL_COMMUNITY): Payer: Self-pay | Admitting: *Deleted

## 2012-12-10 ENCOUNTER — Ambulatory Visit (HOSPITAL_COMMUNITY): Payer: Medicaid Other | Admitting: Critical Care Medicine

## 2012-12-10 ENCOUNTER — Encounter (HOSPITAL_COMMUNITY): Payer: Self-pay | Admitting: Critical Care Medicine

## 2012-12-10 ENCOUNTER — Encounter (HOSPITAL_COMMUNITY)
Admission: RE | Disposition: A | Discharge status: Home / Self Care | Payer: Self-pay | Source: Ambulatory Visit | Attending: Neurosurgery

## 2012-12-10 HISTORY — PX: POSTERIOR CERVICAL FUSION/FORAMINOTOMY: SHX5038

## 2012-12-10 SURGERY — POSTERIOR CERVICAL FUSION/FORAMINOTOMY LEVEL 1
Anesthesia: General | Site: Neck | Wound class: Clean

## 2012-12-10 MED ORDER — BUPIVACAINE-EPINEPHRINE 0.5% -1:200000 IJ SOLN
INTRAMUSCULAR | Status: DC | PRN
  Administered 2012-12-10 (×2): 10 mL

## 2012-12-10 MED ORDER — ACETAMINOPHEN 650 MG RE SUPP: 650.0000 mg | RECTAL | Status: DC | PRN

## 2012-12-10 MED ORDER — DIAZEPAM 5 MG PO TABS: 5.0000 mg | ORAL_TABLET | Freq: Four times a day (QID) | ORAL | Status: DC | PRN

## 2012-12-10 MED ORDER — DIPHENHYDRAMINE HCL 50 MG/ML IJ SOLN: 12.5000 mg | Freq: Four times a day (QID) | INTRAMUSCULAR | Status: DC | PRN

## 2012-12-10 MED ORDER — METOPROLOL TARTRATE 25 MG PO TABS
25.0000 mg | ORAL_TABLET | Freq: Two times a day (BID) | ORAL | Status: DC
  Administered 2012-12-10 – 2012-12-11 (×2): 25 mg via ORAL
  Filled 2012-12-10 (×3): qty 1

## 2012-12-10 MED ORDER — SODIUM CHLORIDE 0.9 % IV SOLN
INTRAVENOUS | Status: AC
  Filled 2012-12-10: qty 500

## 2012-12-10 MED ORDER — DOCUSATE SODIUM 100 MG PO CAPS
100.0000 mg | ORAL_CAPSULE | Freq: Two times a day (BID) | ORAL | Status: DC
  Administered 2012-12-10 – 2012-12-11 (×2): 100 mg via ORAL
  Filled 2012-12-10 (×2): qty 1

## 2012-12-10 MED ORDER — MORPHINE SULFATE (PF) 1 MG/ML IV SOLN
INTRAVENOUS | Status: AC
  Filled 2012-12-10: qty 25

## 2012-12-10 MED ORDER — CEFAZOLIN SODIUM-DEXTROSE 2-3 GM-% IV SOLR
2.0000 g | Freq: Three times a day (TID) | INTRAVENOUS | Status: AC
  Administered 2012-12-10 – 2012-12-11 (×2): 2 g via INTRAVENOUS
  Filled 2012-12-10 (×2): qty 50

## 2012-12-10 MED ORDER — MORPHINE SULFATE 2 MG/ML IJ SOLN: 1.0000 mg | INTRAMUSCULAR | Status: DC | PRN

## 2012-12-10 MED ORDER — AMLODIPINE BESYLATE 5 MG PO TABS
5.0000 mg | ORAL_TABLET | Freq: Every day | ORAL | Status: DC
  Administered 2012-12-11: 5 mg via ORAL
  Filled 2012-12-10: qty 1

## 2012-12-10 MED ORDER — MORPHINE SULFATE (PF) 1 MG/ML IV SOLN
INTRAVENOUS | Status: DC
  Administered 2012-12-11: 1.5 mg via INTRAVENOUS
  Administered 2012-12-11: 7.5 mg via INTRAVENOUS
  Administered 2012-12-11: 3 mg via INTRAVENOUS

## 2012-12-10 MED ORDER — LEVOTHYROXINE SODIUM 88 MCG PO TABS
88.0000 ug | ORAL_TABLET | Freq: Every day | ORAL | Status: DC
  Administered 2012-12-11: 88 ug via ORAL
  Filled 2012-12-10 (×2): qty 1

## 2012-12-10 MED ORDER — ATORVASTATIN CALCIUM 40 MG PO TABS
40.0000 mg | ORAL_TABLET | Freq: Every day | ORAL | Status: DC
  Administered 2012-12-10: 40 mg via ORAL
  Filled 2012-12-10 (×2): qty 1

## 2012-12-10 MED ORDER — OXYCODONE-ACETAMINOPHEN 5-325 MG PO TABS
1.0000 | ORAL_TABLET | ORAL | Status: DC | PRN
  Administered 2012-12-11: 2 via ORAL
  Filled 2012-12-10: qty 2

## 2012-12-10 MED ORDER — SODIUM CHLORIDE 0.9 % IJ SOLN: 9.0000 mL | INTRAMUSCULAR | Status: DC | PRN

## 2012-12-10 MED ORDER — OXYCODONE HCL 5 MG PO TABS: 5.0000 mg | ORAL_TABLET | Freq: Once | ORAL | Status: DC | PRN

## 2012-12-10 MED ORDER — GLYCOPYRROLATE 0.2 MG/ML IJ SOLN
INTRAMUSCULAR | Status: DC | PRN
  Administered 2012-12-10: .8 mg via INTRAVENOUS

## 2012-12-10 MED ORDER — LACTATED RINGERS IV SOLN: INTRAVENOUS | Status: DC

## 2012-12-10 MED ORDER — DIPHENHYDRAMINE HCL 12.5 MG/5ML PO ELIX: 12.5000 mg | ORAL_SOLUTION | Freq: Four times a day (QID) | ORAL | Status: DC | PRN

## 2012-12-10 MED ORDER — ONDANSETRON HCL 4 MG/2ML IJ SOLN
4.0000 mg | Freq: Four times a day (QID) | INTRAMUSCULAR | Status: DC | PRN
  Filled 2012-12-10: qty 2

## 2012-12-10 MED ORDER — OXYCODONE HCL 5 MG/5ML PO SOLN: 5.0000 mg | Freq: Once | ORAL | Status: DC | PRN

## 2012-12-10 MED ORDER — NITROGLYCERIN 0.4 MG SL SUBL: 0.4000 mg | SUBLINGUAL_TABLET | SUBLINGUAL | Status: DC | PRN

## 2012-12-10 MED ORDER — HYDROCODONE-ACETAMINOPHEN 5-325 MG PO TABS: 1.0000 | ORAL_TABLET | ORAL | Status: DC | PRN

## 2012-12-10 MED ORDER — HYDROMORPHONE HCL PF 1 MG/ML IJ SOLN
0.2500 mg | INTRAMUSCULAR | Status: DC | PRN
  Administered 2012-12-10: 0.5 mg via INTRAVENOUS

## 2012-12-10 MED ORDER — BACITRACIN ZINC 500 UNIT/GM EX OINT
TOPICAL_OINTMENT | CUTANEOUS | Status: DC | PRN
  Administered 2012-12-10: 1 via TOPICAL

## 2012-12-10 MED ORDER — SODIUM CHLORIDE 0.9 % IR SOLN
Status: DC | PRN
  Administered 2012-12-10: 17:00:00

## 2012-12-10 MED ORDER — LACTATED RINGERS IV SOLN
INTRAVENOUS | Status: DC
  Administered 2012-12-10: 12:00:00 via INTRAVENOUS

## 2012-12-10 MED ORDER — MENTHOL 3 MG MT LOZG: 1.0000 | LOZENGE | OROMUCOSAL | Status: DC | PRN

## 2012-12-10 MED ORDER — ONDANSETRON HCL 4 MG/2ML IJ SOLN
INTRAMUSCULAR | Status: DC | PRN
  Administered 2012-12-10: 4 mg via INTRAVENOUS

## 2012-12-10 MED ORDER — PROMETHAZINE HCL 25 MG/ML IJ SOLN: 6.2500 mg | INTRAMUSCULAR | Status: DC | PRN

## 2012-12-10 MED ORDER — PHENYLEPHRINE HCL 10 MG/ML IJ SOLN
INTRAMUSCULAR | Status: DC | PRN
  Administered 2012-12-10 (×7): 40 ug via INTRAVENOUS

## 2012-12-10 MED ORDER — THROMBIN 5000 UNITS EX SOLR
CUTANEOUS | Status: DC | PRN
  Administered 2012-12-10 (×2): 5000 [IU] via TOPICAL

## 2012-12-10 MED ORDER — NEOSTIGMINE METHYLSULFATE 1 MG/ML IJ SOLN
INTRAMUSCULAR | Status: DC | PRN
  Administered 2012-12-10: 5 mg via INTRAVENOUS

## 2012-12-10 MED ORDER — ALUM & MAG HYDROXIDE-SIMETH 200-200-20 MG/5ML PO SUSP: 30.0000 mL | Freq: Four times a day (QID) | ORAL | Status: DC | PRN

## 2012-12-10 MED ORDER — LIDOCAINE HCL (CARDIAC) 20 MG/ML IV SOLN
INTRAVENOUS | Status: DC | PRN
  Administered 2012-12-10: 1 mg via INTRAVENOUS

## 2012-12-10 MED ORDER — ALBUMIN HUMAN 5 % IV SOLN
INTRAVENOUS | Status: DC | PRN
  Administered 2012-12-10: 17:00:00 via INTRAVENOUS

## 2012-12-10 MED ORDER — BACITRACIN 50000 UNITS IM SOLR
INTRAMUSCULAR | Status: AC
  Filled 2012-12-10: qty 1

## 2012-12-10 MED ORDER — MIDAZOLAM HCL 5 MG/5ML IJ SOLN
INTRAMUSCULAR | Status: DC | PRN
  Administered 2012-12-10: 2 mg via INTRAVENOUS

## 2012-12-10 MED ORDER — ROCURONIUM BROMIDE 100 MG/10ML IV SOLN
INTRAVENOUS | Status: DC | PRN
  Administered 2012-12-10: 50 mg via INTRAVENOUS
  Administered 2012-12-10: 10 mg via INTRAVENOUS

## 2012-12-10 MED ORDER — ACETAMINOPHEN 325 MG PO TABS: 650.0000 mg | ORAL_TABLET | ORAL | Status: DC | PRN

## 2012-12-10 MED ORDER — HYDROMORPHONE HCL PF 1 MG/ML IJ SOLN
INTRAMUSCULAR | Status: AC
  Administered 2012-12-10: 0.5 mg via INTRAVENOUS
  Filled 2012-12-10: qty 1

## 2012-12-10 MED ORDER — HEMOSTATIC AGENTS (NO CHARGE) OPTIME
TOPICAL | Status: DC | PRN
  Administered 2012-12-10: 1 via TOPICAL

## 2012-12-10 MED ORDER — EPHEDRINE SULFATE 50 MG/ML IJ SOLN
INTRAMUSCULAR | Status: DC | PRN
  Administered 2012-12-10: 5 mg via INTRAVENOUS
  Administered 2012-12-10: 10 mg via INTRAVENOUS
  Administered 2012-12-10 (×2): 5 mg via INTRAVENOUS

## 2012-12-10 MED ORDER — CLONAZEPAM 1 MG PO TABS
1.0000 mg | ORAL_TABLET | Freq: Two times a day (BID) | ORAL | Status: DC | PRN
  Administered 2012-12-10: 1 mg via ORAL
  Filled 2012-12-10: qty 1

## 2012-12-10 MED ORDER — PANTOPRAZOLE SODIUM 40 MG PO TBEC
80.0000 mg | DELAYED_RELEASE_TABLET | Freq: Every day | ORAL | Status: DC
  Administered 2012-12-11: 80 mg via ORAL
  Filled 2012-12-10: qty 2

## 2012-12-10 MED ORDER — FENTANYL CITRATE 0.05 MG/ML IJ SOLN
INTRAMUSCULAR | Status: DC | PRN
  Administered 2012-12-10 (×2): 50 ug via INTRAVENOUS
  Administered 2012-12-10: 250 ug via INTRAVENOUS

## 2012-12-10 MED ORDER — ONDANSETRON HCL 4 MG/2ML IJ SOLN
4.0000 mg | INTRAMUSCULAR | Status: DC | PRN
  Administered 2012-12-10 – 2012-12-11 (×2): 4 mg via INTRAVENOUS
  Filled 2012-12-10 (×2): qty 2

## 2012-12-10 MED ORDER — PROPOFOL 10 MG/ML IV BOLUS
INTRAVENOUS | Status: DC | PRN
  Administered 2012-12-10: 100 mg via INTRAVENOUS
  Administered 2012-12-10: 200 mg via INTRAVENOUS
  Administered 2012-12-10: 150 mg via INTRAVENOUS

## 2012-12-10 MED ORDER — PHENOL 1.4 % MT LIQD: 1.0000 | OROMUCOSAL | Status: DC | PRN

## 2012-12-10 MED ORDER — NALOXONE HCL 0.4 MG/ML IJ SOLN: 0.4000 mg | INTRAMUSCULAR | Status: DC | PRN

## 2012-12-10 MED ORDER — 0.9 % SODIUM CHLORIDE (POUR BTL) OPTIME
TOPICAL | Status: DC | PRN
  Administered 2012-12-10: 1000 mL

## 2012-12-10 MED ORDER — LACTATED RINGERS IV SOLN
INTRAVENOUS | Status: DC | PRN
  Administered 2012-12-10 (×2): via INTRAVENOUS

## 2012-12-10 SURGICAL SUPPLY — 73 items
ADH SKN CLS APL DERMABOND .7 (GAUZE/BANDAGES/DRESSINGS)
APL SKNCLS STERI-STRIP NONHPOA (GAUZE/BANDAGES/DRESSINGS) ×2
BAG DECANTER FOR FLEXI CONT (MISCELLANEOUS) ×2 IMPLANT
BENZOIN TINCTURE PRP APPL 2/3 (GAUZE/BANDAGES/DRESSINGS) ×2 IMPLANT
BIT DRILL NEURO 2X3.1 SFT TUCH (MISCELLANEOUS) ×1 IMPLANT
BLADE SURG 11 STRL SS (BLADE) ×2 IMPLANT
BLADE SURG ROTATE 9660 (MISCELLANEOUS) ×1 IMPLANT
BLADE ULTRA TIP 2M (BLADE) ×1 IMPLANT
BRUSH SCRUB EZ 1% IODOPHOR (MISCELLANEOUS) IMPLANT
BRUSH SCRUB EZ PLAIN DRY (MISCELLANEOUS) ×1 IMPLANT
CANISTER SUCTION 2500CC (MISCELLANEOUS) ×2 IMPLANT
CLOTH BEACON ORANGE TIMEOUT ST (SAFETY) ×2 IMPLANT
CONT SPEC 4OZ CLIKSEAL STRL BL (MISCELLANEOUS) ×2 IMPLANT
DECANTER SPIKE VIAL GLASS SM (MISCELLANEOUS) ×2 IMPLANT
DERMABOND ADVANCED (GAUZE/BANDAGES/DRESSINGS)
DERMABOND ADVANCED .7 DNX12 (GAUZE/BANDAGES/DRESSINGS) IMPLANT
DRAPE C-ARM 42X72 X-RAY (DRAPES) ×4 IMPLANT
DRAPE LAPAROTOMY 100X72 PEDS (DRAPES) ×2 IMPLANT
DRAPE MICROSCOPE LEICA (MISCELLANEOUS) IMPLANT
DRAPE POUCH INSTRU U-SHP 10X18 (DRAPES) ×2 IMPLANT
DRILL BIT STERILE 10-24MM (BIT) ×1 IMPLANT
DRILL NEURO 2X3.1 SOFT TOUCH (MISCELLANEOUS) ×2
ELECT BLADE 4.0 EZ CLEAN MEGAD (MISCELLANEOUS) ×2
ELECT REM PT RETURN 9FT ADLT (ELECTROSURGICAL) ×2
ELECTRODE BLDE 4.0 EZ CLN MEGD (MISCELLANEOUS) IMPLANT
ELECTRODE REM PT RTRN 9FT ADLT (ELECTROSURGICAL) ×1 IMPLANT
GAUZE SPONGE 4X4 16PLY XRAY LF (GAUZE/BANDAGES/DRESSINGS) IMPLANT
GLOVE BIO SURGEON STRL SZ 6.5 (GLOVE) ×3 IMPLANT
GLOVE BIO SURGEON STRL SZ8.5 (GLOVE) ×2 IMPLANT
GLOVE BIOGEL PI IND STRL 6.5 (GLOVE) IMPLANT
GLOVE BIOGEL PI INDICATOR 6.5 (GLOVE) ×2
GLOVE ECLIPSE 7.5 STRL STRAW (GLOVE) ×1 IMPLANT
GLOVE EXAM NITRILE LRG STRL (GLOVE) IMPLANT
GLOVE EXAM NITRILE MD LF STRL (GLOVE) IMPLANT
GLOVE EXAM NITRILE XL STR (GLOVE) IMPLANT
GLOVE EXAM NITRILE XS STR PU (GLOVE) IMPLANT
GLOVE SS BIOGEL STRL SZ 8 (GLOVE) ×1 IMPLANT
GLOVE SUPERSENSE BIOGEL SZ 8 (GLOVE) ×1
GOWN BRE IMP SLV AUR LG STRL (GOWN DISPOSABLE) IMPLANT
GOWN BRE IMP SLV AUR XL STRL (GOWN DISPOSABLE) ×5 IMPLANT
GOWN STRL REIN 2XL LVL4 (GOWN DISPOSABLE) ×1 IMPLANT
HEMOSTAT SURGICEL 2X14 (HEMOSTASIS) ×2 IMPLANT
KIT BASIN OR (CUSTOM PROCEDURE TRAY) ×2 IMPLANT
KIT ROOM TURNOVER OR (KITS) ×2 IMPLANT
NDL SPNL 18GX3.5 QUINCKE PK (NEEDLE) IMPLANT
NEEDLE HYPO 22GX1.5 SAFETY (NEEDLE) ×2 IMPLANT
NEEDLE SPNL 18GX3.5 QUINCKE PK (NEEDLE) IMPLANT
NS IRRIG 1000ML POUR BTL (IV SOLUTION) ×2 IMPLANT
PACK LAMINECTOMY NEURO (CUSTOM PROCEDURE TRAY) ×2 IMPLANT
PAD ARMBOARD 7.5X6 YLW CONV (MISCELLANEOUS) ×6 IMPLANT
PATTIES SURGICAL .25X.25 (GAUZE/BANDAGES/DRESSINGS) IMPLANT
PIN MAYFIELD SKULL DISP (PIN) ×2 IMPLANT
PUTTY 2.5ML ACTIFUSE ABX (Putty) ×1 IMPLANT
ROD PRECUT BULLET 3.5X25 (Rod) ×2 IMPLANT
RUBBERBAND STERILE (MISCELLANEOUS) IMPLANT
SCREW CANCELLOUS 3.5X14MM (Screw) ×4 IMPLANT
SCREW SET M6 (Screw) ×4 IMPLANT
SPONGE GAUZE 4X4 12PLY (GAUZE/BANDAGES/DRESSINGS) ×2 IMPLANT
SPONGE LAP 4X18 X RAY DECT (DISPOSABLE) IMPLANT
SPONGE NEURO XRAY DETECT 1X3 (DISPOSABLE) IMPLANT
SPONGE SURGIFOAM ABS GEL SZ50 (HEMOSTASIS) ×1 IMPLANT
STAPLER SKIN PROX WIDE 3.9 (STAPLE) ×1 IMPLANT
STRIP CLOSURE SKIN 1/2X4 (GAUZE/BANDAGES/DRESSINGS) ×1 IMPLANT
SUT ETHILON 2 0 FS 18 (SUTURE) IMPLANT
SUT VIC AB 0 CT1 18XCR BRD8 (SUTURE) ×1 IMPLANT
SUT VIC AB 0 CT1 8-18 (SUTURE) ×2
SUT VIC AB 2-0 CP2 18 (SUTURE) ×2 IMPLANT
SYR 20ML ECCENTRIC (SYRINGE) ×2 IMPLANT
TAPE CLOTH SURG 4X10 WHT LF (GAUZE/BANDAGES/DRESSINGS) ×1 IMPLANT
TOWEL OR 17X24 6PK STRL BLUE (TOWEL DISPOSABLE) ×2 IMPLANT
TOWEL OR 17X26 10 PK STRL BLUE (TOWEL DISPOSABLE) ×2 IMPLANT
TRAY FOLEY CATH 14FRSI W/METER (CATHETERS) IMPLANT
WATER STERILE IRR 1000ML POUR (IV SOLUTION) ×2 IMPLANT

## 2012-12-10 NOTE — Anesthesia Postprocedure Evaluation (Signed)
  Anesthesia Post-op Note  Patient: Jeff [name]  Procedure(s) Performed: Procedure(s) with comments: POSTERIOR CERVICAL FUSION/FORAMINOTOMY LEVEL 1 (N/A) - Cervical three-four posterior cervical fusion with lateral mass screws  Patient Location: PACU  Anesthesia Type:General  Level of Consciousness: awake  Airway and Oxygen Therapy: Patient Spontanous Breathing  Post-op Pain: mild  Post-op Assessment: Post-op Vital signs reviewed, Patient's Cardiovascular Status Stable, Respiratory Function Stable, Patent Airway, No signs of Nausea or vomiting and Pain level controlled  Post-op Vital Signs: stable  Complications: No apparent anesthesia complications

## 2012-12-10 NOTE — Anesthesia Preprocedure Evaluation (Addendum)
Anesthesia Evaluation  Patient identified by MRN, date of birth, ID band Patient awake    Reviewed: Allergy & Precautions, H&P , NPO status , Patient's Chart, lab work & pertinent test results  Airway Mallampati: I  Neck ROM: Full    Dental  (+) Teeth Intact and Dental Advisory Given   Pulmonary neg pulmonary ROS,          Cardiovascular hypertension, Pt. on medications + Past MI Rhythm:Regular Rate:Normal  Cath showed no CAD, felt to be coronary spasm at the time   Neuro/Psych  Headaches,    GI/Hepatic GERD-  ,  Endo/Other  Hypothyroidism Hyperthyroidism   Renal/GU      Musculoskeletal   Abdominal   Peds  Hematology   Anesthesia Other Findings   Reproductive/Obstetrics                          Anesthesia Physical Anesthesia Plan  ASA: II  Anesthesia Plan: General   Post-op Pain Management:    Induction: Intravenous  Airway Management Planned: Oral ETT  Additional Equipment:   Intra-op Plan:   Post-operative Plan: Extubation in OR  Informed Consent: I have reviewed the patients History and Physical, chart, labs and discussed the procedure including the risks, benefits and alternatives for the proposed anesthesia with the patient or authorized representative who has indicated his/her understanding and acceptance.   Dental advisory given  Plan Discussed with: CRNA and Surgeon  Anesthesia Plan Comments:         Anesthesia Quick Evaluation

## 2012-12-10 NOTE — Progress Notes (Signed)
Patient ID: DENNIS HEGEMAN, male   DOB: 07-Oct-1965, 47 y.o.   MRN: [id number] Subjective:  The patient is somnolent but arousable. He is in no apparent distress.  Objective: Vital signs in last 24 hours: Temp:  [97.1 F (36.2 C)-97.8 F (36.6 C)] 97.8 F (36.6 C) (07/28 1800) Pulse Rate:  [70-118] 118 (07/28 1800) Resp:  [20-21] 21 (07/28 1800) BP: (142)/(95) 142/95 mmHg (07/28 1142) SpO2:  [98 %-100 %] 100 % (07/28 1800)  Intake/Output from previous day:   Intake/Output this shift: Total I/O In: 2050 [I.V.:1800; IV Piggyback:250] Out: -   Physical exam patient is somnolent but arousable. He is moving all 4 extremities well.  Lab Results: No results found for this basename: WBC, HGB, HCT, PLT,  in the last 72 hours BMET No results found for this basename: NA, K, CL, CO2, GLUCOSE, BUN, CREATININE, CALCIUM,  in the last 72 hours  Studies/Results: No results found.  Assessment/Plan: The patient is doing well.  LOS: 0 days     Monisha Siebel D 12/10/2012, 6:02 PM

## 2012-12-10 NOTE — Preoperative (Signed)
Beta Blockers   Reason not to administer Beta Blockers:Not Applicable, pt took lopressor 7/28

## 2012-12-10 NOTE — H&P (Signed)
Subjective: The patient is a 47 year old black male who has complained of chronic neck and back pain. I performed a C3-4 anterior cervical discectomy fusion and plating in the past. He has had chronic neck pain. I worked him up with cervical x-rays and a cervical CT which demonstrated findings consistent with a cervical pseudoarthrosis. I discussed the various treatment options with the patient including surgery. The patient has weighed the risks, benefits, and alternatives surgery and decided proceed with a C3-4 posterior instrumentation and fusion.   Past Medical History  Diagnosis Date  . HYPERTENSION 03/16/2010  . HYPERTHYROIDISM 03/16/2010    s/p RAI  . THYROID STORM 03/16/2010  . Altered mental status 03/16/2010  . Hypothyroidism following radioiodine therapy   . NSTEMI (non-ST elevated myocardial infarction)     05/2012:  LHC with normal EF and no CAD (? spasm)  . GERD (gastroesophageal reflux disease)   . MVHQIONG(295.2)     Past Surgical History  Procedure Laterality Date  . Back surgery  03/2009, 10/2009  . Cervical disc surgery  10  . Shoulder arthroscopy w/ rotator cuff repair Right 2000    athroscopy debridement, 2nd rotator cuff  . Hernia repair      umbilical kid    No Known Allergies  History  Substance Use Topics  . Smoking status: Never Smoker   . Smokeless tobacco: Not on file  . Alcohol Use: 1.2 oz/week    2 Cans of beer per week    Family History  Problem Relation Age of Onset  . Thyroid disease Mother     hypothyroidism  . Arthritis Other   . Hypertension Other   . Heart disease Other     Parents, Grandparents  . Diabetes Other     Grandparents   Prior to Admission medications   Medication Sig Start Date End Date Taking? Authorizing Provider  amLODipine (NORVASC) 5 MG tablet Take 1 tablet (5 mg total) by mouth daily. 10/04/12  Yes Scott T Alben Spittle, PA-C  atorvastatin (LIPITOR) 40 MG tablet Take 40 mg by mouth at bedtime.    Yes Historical Provider, MD   clonazePAM (KLONOPIN) 1 MG tablet Take 1 mg by mouth 2 (two) times daily as needed for anxiety.   Yes Historical Provider, MD  levothyroxine (SYNTHROID, LEVOTHROID) 88 MCG tablet Take 88 mcg by mouth daily.   Yes Historical Provider, MD  MAGNESIUM PO Take 250 mg by mouth daily.   Yes Historical Provider, MD  metoprolol (LOPRESSOR) 50 MG tablet Take 0.5 tablets (25 mg total) by mouth 2 (two) times daily. 07/17/12  Yes Scott Moishe Spice, PA-C  nitroGLYCERIN (NITROSTAT) 0.4 MG SL tablet Place 1 tablet (0.4 mg total) under the tongue every 5 (five) minutes as needed for chest pain. 10/04/12  Yes Scott Moishe Spice, PA-C  omeprazole (PRILOSEC) 40 MG capsule Take 40 mg by mouth daily.   Yes Historical Provider, MD  oxyCODONE-acetaminophen (PERCOCET) 10-325 MG per tablet Take 1 tablet by mouth every 6 (six) hours as needed for pain.    Yes Historical Provider, MD     Review of Systems  Positive ROS: As above the patient complains of chronic neck and back pain  All other systems have been reviewed and were otherwise negative with the exception of those mentioned in the HPI and as above.  Objective: Vital signs in last 24 hours: Temp:  [97.1 F (36.2 C)] 97.1 F (36.2 C) (07/28 1142) Pulse Rate:  [70] 70 (07/28 1142) Resp:  [20] 20 (07/28  1142) BP: (142)/(95) 142/95 mmHg (07/28 1142) SpO2:  [98 %] 98 % (07/28 1142)  General Appearance: Alert, cooperative, no distress, appears stated age Head: Normocephalic, without obvious abnormality, atraumatic Eyes: PERRL, conjunctiva/corneas clear, EOM's intact, fundi benign, both eyes      Ears: Normal TM's and external ear canals, both ears Throat: Lips, mucosa, and tongue normal; teeth and gums normal Neck: Supple, symmetrical, trachea midline, no adenopathy; thyroid: No enlargement/tenderness/nodules; no carotid bruit or JVD Back: Symmetric, no curvature, ROM normal, no CVA tenderness Lungs: Clear to auscultation bilaterally, respirations unlabored Heart:  Regular rate and rhythm, S1 and S2 normal, no murmur, rub or gallop Abdomen: Soft, non-tender, bowel sounds active all four quadrants, no masses, no organomegaly Extremities: Extremities normal, atraumatic, no cyanosis or edema Pulses: 2+ and symmetric all extremities Skin: Skin color, texture, turgor normal, no rashes or lesions  NEUROLOGIC:   Mental status: alert and oriented, no aphasia, good attention span, Fund of knowledge/ memory ok Motor Exam - grossly normal Sensory Exam - grossly normal Reflexes:  Coordination - grossly normal Gait - grossly normal Balance - grossly normal Cranial Nerves: I: smell Not tested  II: visual acuity  OS: Normal    OD: Normal   II: visual fields Full to confrontation  II: pupils Equal, round, reactive to light  III,VII: ptosis None  III,IV,VI: extraocular muscles  Full ROM  V: mastication Normal  V: facial light touch sensation  Normal  V,VII: corneal reflex  Present  VII: facial muscle function - upper  Normal  VII: facial muscle function - lower Normal  VIII: hearing Not tested  IX: soft palate elevation  Normal  IX,X: gag reflex Present  XI: trapezius strength  5/5  XI: sternocleidomastoid strength 5/5  XI: neck flexion strength  5/5  XII: tongue strength  Normal    Data Review Lab Results  Component Value Date   WBC 8.7 12/07/2012   HGB 15.9 12/07/2012   HCT 47.4 12/07/2012   MCV 87.5 12/07/2012   PLT 163 12/07/2012   Lab Results  Component Value Date   NA 139 12/07/2012   K 4.6 12/07/2012   CL 101 12/07/2012   CO2 28 12/07/2012   BUN 5* 12/07/2012   CREATININE 1.09 12/07/2012   GLUCOSE 104* 12/07/2012   Lab Results  Component Value Date   INR 1.05 06/12/2012    Assessment/Plan: 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 I reviewed the imaging studies with them and pointed out the abnormalities. We have discussed the various treatment options including surgery. I described the surgical  treatment option of a posterior C3-4 instrumentation and fusion. I described the surgery to them I have shown him surgical models. We have discussed the risks, benefits, alternatives, and likelihood of achieving our goals with surgery. I have answered all the patient and his wife's questions. He has decided proceed with surgery.   Cantrell Larouche D 12/10/2012 3:52 PM

## 2012-12-10 NOTE — Op Note (Signed)
Brief history: The patient is a 47 year old black male who said chronic neck and back pain. I previously performed a C3-4 anterior cervical discectomy fusion and plating on the patient. The patient has had some persistent neck pain. I worked him up with a cervical CT which demonstrated some questionable loosening of the hardware. I discussed the various treatment options with the patient including surgery. He has weighed the risks, benefits, and alternatives surgery and decided proceed with a C3-4 posterior instrumentation and fusion.  Preop diagnosis: Cervical pseudoarthrosis, cervicalgia  Postop diagnosis: The same  Procedure: Posterior C34 arthrodesis with local morselized autograft bone and Actifusebone graft extender; posterior CD4 instrumentation with Medtronic titanium lateral mass screws and rods  Surgeon: Dr. Delma Officer  Assistant: Dr. Mardelle Matte pool  Anesthesia: Gen. endotracheal  Estimated blood loss: 25 cc  Specimens: None  Drains: None  Complications: None  Description of the procedure: The patient was brought to the operating room by the anesthesia team. General endotracheal anesthesia was induced. I applied the [place] 3 point headrest the patient's calvarium. The patient was then turned to the prone position on the chest rolls. His suboccipital posterior cervical and upper thoracic region was then prepared with iodine scrub and Betadine solution. Sterile drapes were applied. I then injected the area to be incised with Marcaine with epinephrine solution. I used a scalpel to make a linear midline incision over the C3-4 interspace. I used electrocautery to perform a bilateral subperiosteal dissection exposing the spinous process lamina from C2-C5 bilaterally. We obtained a level with intraoperative fluoroscopy. We inserted the [facility] retractor for exposure. I used light cautery to expose the lateral masses at C3 and C4. Identified the Center point of the lateral masses. We were  able to use intraoperative fluoroscopy for the lateral mass screws at C3. I drill a 14 or hole in a cephalad and lateral direction under fluoroscopic guidance. We were unable to use fluoroscopy at C4 because of the patient's shoulder obstructed the view. Again I drilled a hole from the Center of the lateral mass in a cephalad and lateral direction. We probed inside these holes with a ball probe to rule out cortical breaches. I then inserted a 14 mm titanium polyaxial screw into the lateral masses at C3 and C4 bilaterally. We got good bony purchase. We then connected the unilateral screws with a rod which we secured in place by tightening the caps. This completed the instrumentation.  We now turned attention to the posterior lateral arthrodesis. I used a high-speed drill to decorticate the lateral aspect of the L3 and L4 lamina as well as the lateral masses. We laid accommodation of local autograft bone we obtained during the drilling as well as Actifuse bone graft extender over the decorticated lateral masses at C3 and C4 this completed the posterior lateral arthrodesis.  We then obtained hemostasis using bipolar electrocautery. We removed the retractor and reapproximated patient's cervical thoracic fascia with interrupted #1 Vicryl suture. We reapproximated the subcutaneous tissue with interrupted 2-0 Vicryl suture. We reapproximated the skin with Steri-Strips and benzoin. The wound was then coated with bacitracin ointment. A sterile dressing was applied. The drapes were removed. And the patient was subsequently returned to the supine position. I then removed the [place] 3 point headrest the patient's calvarium. The patient has small scalp laceration which was reapproximated with stainless steel staples. The patient was subsequently extubated by the anesthesia team and transported to the post anesthesia care he in stable condition. By report all sponge instrument  and needle counts were correct at the end this  case.

## 2012-12-10 NOTE — Transfer of Care (Signed)
Immediate Anesthesia Transfer of Care Note  Patient: Jeff [name]  Procedure(s) Performed: Procedure(s) with comments: POSTERIOR CERVICAL FUSION/FORAMINOTOMY LEVEL 1 (N/A) - Cervical three-four posterior cervical fusion with lateral mass screws  Patient Location: PACU  Anesthesia Type:General  Level of Consciousness: awake, alert  and oriented  Airway & Oxygen Therapy: Patient connected to face mask oxygen  Post-op Assessment: Report given to PACU RN  Post vital signs: stable  Complications: No apparent anesthesia complications

## 2012-12-10 NOTE — Anesthesia Procedure Notes (Signed)
Procedure Name: Intubation Date/Time: 12/10/2012 4:09 PM Performed by: Ellin Goodie Pre-anesthesia Checklist: Patient identified, Emergency Drugs available, Suction available, Patient being monitored and Timeout performed Patient Re-evaluated:Patient Re-evaluated prior to inductionOxygen Delivery Method: Circle system utilized Preoxygenation: Pre-oxygenation with 100% oxygen Intubation Type: IV induction Ventilation: Mask ventilation without difficulty Laryngoscope Size: Mac and 4 Grade View: Grade III Tube type: Oral Tube size: 7.5 mm Number of attempts: 2 Airway Equipment and Method: Stylet Placement Confirmation: ETT inserted through vocal cords under direct vision,  positive ETCO2 and breath sounds checked- equal and bilateral Secured at: 23 cm Tube secured with: Tape Dental Injury: Teeth and Oropharynx as per pre-operative assessment  Comments: Induction and intubation by Cindie Crumbly, CRNA

## 2012-12-11 ENCOUNTER — Encounter (HOSPITAL_COMMUNITY): Payer: Self-pay | Admitting: Neurosurgery

## 2012-12-11 MED ORDER — DIAZEPAM 5 MG PO TABS: 5.0000 mg | ORAL_TABLET | Freq: Four times a day (QID) | ORAL | Status: DC | PRN

## 2012-12-11 MED ORDER — DSS 100 MG PO CAPS: 100.0000 mg | ORAL_CAPSULE | Freq: Two times a day (BID) | ORAL | Status: DC

## 2012-12-11 MED ORDER — OXYCODONE-ACETAMINOPHEN 10-325 MG PO TABS: 1.0000 | ORAL_TABLET | ORAL | Status: DC | PRN

## 2012-12-11 NOTE — Discharge Summary (Signed)
Physician Discharge Summary  Patient ID: Jeff [name] MRN: [id number] DOB/AGE: 07/08/1965 47 y.o.  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 *   Discharged Condition: good  Hospital Course: I performed a C3-4 posterior instrumentation and fusion on the patient on 12/10/2012. The surgery went well.  The patient's postoperative course was unremarkable. On postop day #1 he requested discharge to home. The patient was given oral and written discharge instructions. All his questions were answered.  Consults:none Significant Diagnostic Studies:none Treatments:C3-4 posterior instrumentation and fusion Discharge Exam: Blood pressure 147/87, pulse 88, temperature 98 F (36.7 C), temperature source Oral, resp. rate 16, SpO2 100.00%. Patient is alert and oriented. His strength is normal. His dressing is clean and dry.  Disposition: home  Discharge Orders   Future Appointments Provider Department Dept Phone   06/24/2013 9:00 AM Romero Belling, MD [facility] PRIMARY [facility] ENDOCRINOLOGY [phone]   Future Orders Complete By Expires     Call MD for:  difficulty breathing, headache or visual disturbances  As directed     Call MD for:  extreme fatigue  As directed     Call MD for:  hives  As directed     Call MD for:  persistant dizziness or light-headedness  As directed     Call MD for:  persistant nausea and vomiting  As directed     Call MD for:  redness, tenderness, or signs of infection (pain, swelling, redness, odor or green/yellow discharge around incision site)  As directed     Call MD for:  severe uncontrolled pain  As directed     Call MD for:  temperature >100.4  As directed     Diet - low sodium heart healthy  As directed     Discharge instructions  As directed     Comments:      Call [phone] for a followup appointment. Take a  stool softener while you are using pain medications.    Driving Restrictions  As directed     Comments:      Do not drive for 2 weeks.    Increase activity slowly  As directed     Lifting restrictions  As directed     Comments:      Do not lift more than 5 pounds. No excessive bending or twisting.    May shower / Bathe  As directed     Comments:      He may shower after the pain she is removed 3 days after surgery. Leave the incision alone.    Remove dressing in 48 hours  As directed     Comments:      Your stitches are under the scan and will dissolve by themselves. The Steri-Strips will fall off after you take a few showers. Do not rub back or pick at the wound, Leave the wound alone.        Medication List         amLODipine 5 MG tablet  Commonly known as:  NORVASC  Take 1 tablet (5 mg total) by mouth daily.     atorvastatin 40 MG tablet  Commonly known as:  LIPITOR  Take 40 mg by mouth at bedtime.     clonazePAM 1 MG tablet  Commonly known as:  KLONOPIN  Take 1 mg by mouth 2 (two) times daily as needed for anxiety.     diazepam  5 MG tablet  Commonly known as:  VALIUM  Take 1 tablet (5 mg total) by mouth every 6 (six) hours as needed.     DSS 100 MG Caps  Take 100 mg by mouth 2 (two) times daily.     levothyroxine 88 MCG tablet  Commonly known as:  SYNTHROID, LEVOTHROID  Take 88 mcg by mouth daily.     MAGNESIUM PO  Take 250 mg by mouth daily.     metoprolol 50 MG tablet  Commonly known as:  LOPRESSOR  Take 0.5 tablets (25 mg total) by mouth 2 (two) times daily.     nitroGLYCERIN 0.4 MG SL tablet  Commonly known as:  NITROSTAT  Place 1 tablet (0.4 mg total) under the tongue every 5 (five) minutes as needed for chest pain.     omeprazole 40 MG capsule  Commonly known as:  PRILOSEC  Take 40 mg by mouth daily.     oxyCODONE-acetaminophen 10-325 MG per tablet  Commonly known as:  PERCOCET  Take 1 tablet by mouth every 6 (six) hours as needed for pain.      oxyCODONE-acetaminophen 10-325 MG per tablet  Commonly known as:  PERCOCET  Take 1 tablet by mouth every 4 (four) hours as needed for pain.         SignedCristi Loron 12/11/2012, 7:47 AM

## 2012-12-11 NOTE — Progress Notes (Signed)
UR COMPLETED  

## 2013-01-29 ENCOUNTER — Other Ambulatory Visit: Payer: Self-pay | Admitting: Endocrinology

## 2013-02-01 ENCOUNTER — Other Ambulatory Visit: Payer: Self-pay

## 2013-02-01 MED ORDER — LEVOTHYROXINE SODIUM 88 MCG PO TABS: ORAL_TABLET | ORAL | Status: DC

## 2013-02-08 ENCOUNTER — Other Ambulatory Visit: Payer: Self-pay | Admitting: Neurosurgery

## 2013-02-08 DIAGNOSIS — M5416 Radiculopathy, lumbar region: Secondary | ICD-10-CM

## 2013-02-21 ENCOUNTER — Ambulatory Visit
Admission: RE | Admit: 2013-02-21 | Discharge: 2013-02-21 | Disposition: A | Discharge status: Home / Self Care | Payer: Medicaid Other | Source: Ambulatory Visit | Attending: Neurosurgery | Admitting: Neurosurgery

## 2013-02-21 DIAGNOSIS — M5416 Radiculopathy, lumbar region: Secondary | ICD-10-CM

## 2013-02-21 MED ORDER — GADOBENATE DIMEGLUMINE 529 MG/ML IV SOLN
20.0000 mL | Freq: Once | INTRAVENOUS | Status: AC | PRN
  Administered 2013-02-21: 20 mL via INTRAVENOUS

## 2013-02-23 ENCOUNTER — Other Ambulatory Visit: Payer: Self-pay | Admitting: Endocrinology

## 2013-03-21 ENCOUNTER — Other Ambulatory Visit: Payer: Self-pay

## 2013-03-25 ENCOUNTER — Other Ambulatory Visit: Payer: Self-pay | Admitting: *Deleted

## 2013-03-25 MED ORDER — LEVOTHYROXINE SODIUM 88 MCG PO TABS: ORAL_TABLET | ORAL | Status: DC

## 2013-04-02 ENCOUNTER — Telehealth: Payer: Self-pay | Admitting: *Deleted

## 2013-04-02 MED ORDER — LEVOTHYROXINE SODIUM 88 MCG PO TABS: ORAL_TABLET | ORAL | Status: DC

## 2013-04-02 NOTE — Telephone Encounter (Signed)
Pt phoned requesting levothyroxine refilled.

## 2013-04-02 NOTE — Telephone Encounter (Signed)
Returned patient's phone call-left voicemail message

## 2013-05-07 ENCOUNTER — Other Ambulatory Visit: Payer: Self-pay

## 2013-05-07 DIAGNOSIS — I10 Essential (primary) hypertension: Secondary | ICD-10-CM

## 2013-05-07 MED ORDER — AMLODIPINE BESYLATE 5 MG PO TABS: 5.0000 mg | ORAL_TABLET | Freq: Every day | ORAL | Status: DC

## 2013-05-21 DIAGNOSIS — M549 Dorsalgia, unspecified: Secondary | ICD-10-CM | POA: Insufficient documentation

## 2013-05-21 DIAGNOSIS — M5417 Radiculopathy, lumbosacral region: Secondary | ICD-10-CM | POA: Insufficient documentation

## 2013-06-20 DIAGNOSIS — M5136 Other intervertebral disc degeneration, lumbar region: Secondary | ICD-10-CM | POA: Insufficient documentation

## 2013-06-24 ENCOUNTER — Ambulatory Visit (INDEPENDENT_AMBULATORY_CARE_PROVIDER_SITE_OTHER): Payer: Medicaid Other | Admitting: Endocrinology

## 2013-06-24 ENCOUNTER — Encounter: Payer: Self-pay | Admitting: Endocrinology

## 2013-06-24 VITALS — BP 124/78 | HR 76 | Temp 98.5°F | Ht 70.0 in | Wt 252.0 lb

## 2013-06-24 DIAGNOSIS — E890 Postprocedural hypothyroidism: Secondary | ICD-10-CM

## 2013-06-24 LAB — TSH: TSH: 5.389 u[IU]/mL — ABNORMAL HIGH (ref 0.350–4.500)

## 2013-06-24 NOTE — Patient Instructions (Signed)
blood tests are being requested for you today.  We'll contact you with results.   Please return in 1 year.   

## 2013-06-24 NOTE — Progress Notes (Signed)
Subjective:    Patient ID: ISSAAC [name], male    DOB: [birth year], 48 y.o.   MRN: [id number]  HPI In early 2012, pt had i-131 rx for hyperthyroidism, due to grave's dz vs multinodular goiter vs both. He is now on synthroid 88 mcg/day.  pt states he feels well in general.   Past Medical History  Diagnosis Date  . HYPERTENSION 03/16/2010  . HYPERTHYROIDISM 03/16/2010    s/p RAI  . THYROID STORM 03/16/2010  . Altered mental status 03/16/2010  . Hypothyroidism following radioiodine therapy   . NSTEMI (non-ST elevated myocardial infarction)     05/2012:  LHC with normal EF and no CAD (? spasm)  . GERD (gastroesophageal reflux disease)   . PFXTKWIO(973.5)     Past Surgical History  Procedure Laterality Date  . Back surgery  03/2009, 10/2009  . Cervical disc surgery  10  . Shoulder arthroscopy w/ rotator cuff repair Right 2000    athroscopy debridement, 2nd rotator cuff  . Hernia repair      umbilical kid  . Posterior cervical fusion/foraminotomy N/A 12/10/2012    Procedure: POSTERIOR CERVICAL FUSION/FORAMINOTOMY LEVEL 1;  Surgeon: Ophelia Charter, MD;  Location: [place] [facility];  Service: Neurosurgery;  Laterality: N/A;  Cervical three-four posterior cervical fusion with lateral mass screws    History   Social History  . Marital Status: Married    Spouse Name: N/A    Number of Children: N/A  . Years of Education: N/A   Occupational History  . Disabled    Social History Main Topics  . Smoking status: Never Smoker   . Smokeless tobacco: Not on file  . Alcohol Use: 1.2 oz/week    2 Cans of beer per week  . Drug Use: No  . Sexual Activity: Not on file   Other Topics Concern  . Not on file   Social History Narrative   Regular exercise-no   Disabled to due back problem    Current Outpatient Prescriptions on File Prior to Visit  Medication Sig Dispense Refill  . amLODipine (NORVASC) 5 MG tablet Take 1 tablet (5 mg total) by mouth daily.  30 tablet  6  . atorvastatin  (LIPITOR) 40 MG tablet Take 40 mg by mouth at bedtime.       . clonazePAM (KLONOPIN) 1 MG tablet Take 1 mg by mouth 2 (two) times daily as needed for anxiety.      . diazepam (VALIUM) 5 MG tablet Take 1 tablet (5 mg total) by mouth every 6 (six) hours as needed.  50 tablet  1  . docusate sodium 100 MG CAPS Take 100 mg by mouth 2 (two) times daily.  60 capsule  0  . levothyroxine (SYNTHROID, LEVOTHROID) 88 MCG tablet TAKE 1 TABLET EVERY DAY  90 tablet  3  . MAGNESIUM PO Take 250 mg by mouth daily.      . metoprolol (LOPRESSOR) 50 MG tablet Take 0.5 tablets (25 mg total) by mouth 2 (two) times daily.      . nitroGLYCERIN (NITROSTAT) 0.4 MG SL tablet Place 1 tablet (0.4 mg total) under the tongue every 5 (five) minutes as needed for chest pain.  25 tablet  3  . omeprazole (PRILOSEC) 40 MG capsule Take 40 mg by mouth daily.      Marland Kitchen oxyCODONE-acetaminophen (PERCOCET) 10-325 MG per tablet Take 1 tablet by mouth every 6 (six) hours as needed for pain.       Marland Kitchen oxyCODONE-acetaminophen (PERCOCET) 10-325  MG per tablet Take 1 tablet by mouth every 4 (four) hours as needed for pain.  100 tablet  0   No current facility-administered medications on file prior to visit.    No Known Allergies  Family History  Problem Relation Age of Onset  . Thyroid disease Mother     hypothyroidism  . Arthritis Other   . Hypertension Other   . Heart disease Other     Parents, Grandparents  . Diabetes Other     Grandparents    BP 124/78  Pulse 76  Temp(Src) 98.5 F (36.9 C) (Oral)  Ht 5\' 10"  (1.778 m)  Wt 252 lb (114.306 kg)  BMI 36.16 kg/m2  SpO2 97%  Review of Systems Denies weight change    Objective:   Physical Exam VITAL SIGNS:  See vs page GENERAL: no distress NECK: There is no palpable thyroid enlargement.  No thyroid nodule is palpable.  No palpable lymphadenopathy at the anterior neck.  Lab Results  Component Value Date   TSH 5.389* 06/24/2013      Assessment & Plan:  Post-i-131  hypothyroidism: mildly underreplaced

## 2013-06-25 ENCOUNTER — Telehealth: Payer: Self-pay

## 2013-06-25 ENCOUNTER — Other Ambulatory Visit: Payer: Self-pay | Admitting: Neurosurgery

## 2013-06-25 MED ORDER — LEVOTHYROXINE SODIUM 100 MCG PO TABS: 100.0000 ug | ORAL_TABLET | Freq: Every day | ORAL | Status: DC

## 2013-06-25 NOTE — Telephone Encounter (Signed)
Pt called stating that he would be ok with increasing his thyroid medication. Thanks!

## 2013-06-25 NOTE — Telephone Encounter (Signed)
i have sent a prescription to your pharmacy 

## 2013-06-25 NOTE — Telephone Encounter (Signed)
Pt informed

## 2013-07-08 ENCOUNTER — Other Ambulatory Visit (HOSPITAL_COMMUNITY): Payer: Self-pay | Admitting: *Deleted

## 2013-07-08 ENCOUNTER — Encounter (HOSPITAL_COMMUNITY): Payer: Self-pay | Admitting: Pharmacy Technician

## 2013-07-08 NOTE — Pre-Procedure Instructions (Addendum)
Jeff [name]  07/08/2013   Your procedure is scheduled on:  Monday, July 15, 2013 at 11:55 AM.   Report to [facility] Entrance "A" Admitting Office at 900 AM.   Call this number if you have problems the morning of surgery: [phone]   Remember:   Do not eat food or drink liquids after midnight Sunday, 07/14/13.   Take these medicines the morning of surgery with A SIP OF WATER: amLODipine (NORVASC),  levothyroxine (SYNTHROID, LEVOTHROID), metoprolol (LOPRESSOR),prilosec.  You may take one of your pain medications if you need it, clonazePAM (KLONOPIN) or diazepam (VALIUM) - if needed,              STOP all herbel meds, nsaids (aleve,naproxen,advil,ibuprofen) 5 days prior to surgery Including fish oil, diclofenac, vitamins, aspirin   Do not wear jewelry.  Do not wear lotions, powders, or cologne. You may wear deodorant.  Men may shave face and neck.  Do not bring valuables to the hospital.  [facility] is not responsible                  for any belongings or valuables.               Contacts, dentures or bridgework may not be worn into surgery.  Leave suitcase in the car. After surgery it may be brought to your room.  For patients admitted to the hospital, discharge time is determined by your                treatment team.              Special Instructions: [facility] - Preparing for Surgery  Before surgery, you can play an important role.  Because skin is not sterile, your skin needs to be as free of germs as possible.  You can reduce the number of germs on you skin by washing with CHG (chlorahexidine gluconate) soap before surgery.  CHG is an antiseptic cleaner which kills germs and bonds with the skin to continue killing germs even after washing.  Please DO NOT use if you have an allergy to CHG or antibacterial soaps.  If your skin becomes reddened/irritated stop using the CHG and inform your nurse when you arrive at Short Stay.  Do not shave (including legs and  underarms) for at least 48 hours prior to the first CHG shower.  You may shave your face.  Please follow these instructions carefully:   1.  Shower with CHG Soap the night before surgery and the                                morning of Surgery.  2.  If you choose to wash your hair, wash your hair first as usual with your       normal shampoo.  3.  After you shampoo, rinse your hair and body thoroughly to remove the                      Shampoo.  4.  Use CHG as you would any other liquid soap.  You can apply chg directly       to the skin and wash gently with scrungie or a clean washcloth.  5.  Apply the CHG Soap to your body ONLY FROM THE NECK DOWN.        Do not use on open wounds or open sores.  Avoid contact with your eyes, ears, mouth and genitals (private parts).  Wash genitals (private parts) with your normal soap.  6.  Wash thoroughly, paying special attention to the area where your surgery        will be performed.  7.  Thoroughly rinse your body with warm water from the neck down.  8.  DO NOT shower/wash with your normal soap after using and rinsing off       the CHG Soap.  9.  Pat yourself dry with a clean towel.            10.  Wear clean pajamas.            11.  Place clean sheets on your bed the night of your first shower and do not        sleep with pets.  Day of Surgery  Do not apply any lotions/deoderants the morning of surgery.  Please wear clean clothes to the hospital/surgery center.     Please read over the following fact sheets that you were given: Pain Booklet, Coughing and Deep Breathing, Blood Transfusion Information, MRSA Information and Surgical Site Infection Prevention

## 2013-07-09 ENCOUNTER — Encounter (HOSPITAL_COMMUNITY)
Admission: RE | Admit: 2013-07-09 | Discharge: 2013-07-09 | Disposition: A | Discharge status: Home / Self Care | Payer: Medicaid Other | Source: Ambulatory Visit | Attending: Neurosurgery | Admitting: Neurosurgery

## 2013-07-09 ENCOUNTER — Encounter (HOSPITAL_COMMUNITY): Payer: Self-pay

## 2013-07-09 ENCOUNTER — Encounter (HOSPITAL_COMMUNITY)
Admission: RE | Admit: 2013-07-09 | Discharge: 2013-07-09 | Disposition: A | Discharge status: Home / Self Care | Payer: Medicaid Other | Source: Ambulatory Visit | Attending: Anesthesiology | Admitting: Anesthesiology

## 2013-07-09 DIAGNOSIS — Z01812 Encounter for preprocedural laboratory examination: Secondary | ICD-10-CM | POA: Insufficient documentation

## 2013-07-09 DIAGNOSIS — Z01818 Encounter for other preprocedural examination: Secondary | ICD-10-CM | POA: Insufficient documentation

## 2013-07-09 HISTORY — DX: Unspecified osteoarthritis, unspecified site: M19.90

## 2013-07-09 LAB — CBC
HCT: 47.9 % (ref 39.0–52.0)
Hemoglobin: 15.7 g/dL (ref 13.0–17.0)
MCH: 28.6 pg (ref 26.0–34.0)
MCHC: 32.8 g/dL (ref 30.0–36.0)
MCV: 87.4 fL (ref 78.0–100.0)
Platelets: 175 10*3/uL (ref 150–400)
RBC: 5.48 MIL/uL (ref 4.22–5.81)
RDW: 13.9 % (ref 11.5–15.5)
WBC: 9.4 10*3/uL (ref 4.0–10.5)

## 2013-07-09 LAB — BASIC METABOLIC PANEL
BUN: 9 mg/dL (ref 6–23)
CO2: 27 mEq/L (ref 19–32)
Calcium: 9 mg/dL (ref 8.4–10.5)
Chloride: 101 mEq/L (ref 96–112)
Creatinine, Ser: 1.09 mg/dL (ref 0.50–1.35)
GFR calc Af Amer: >90 mL/min (ref >90)
GFR calc non Af Amer: 80 mL/min — ABNORMAL LOW (ref >90)
Glucose, Bld: 91 mg/dL (ref 70–99)
Potassium: 4.7 mEq/L (ref 3.7–5.3)
Sodium: 141 mEq/L (ref 137–147)

## 2013-07-09 LAB — SURGICAL PCR SCREEN
MRSA, PCR: NEGATIVE
Staphylococcus aureus: NEGATIVE

## 2013-07-09 LAB — TYPE AND SCREEN
ABO/RH(D): O POS
Antibody Screen: NEGATIVE

## 2013-07-12 NOTE — Progress Notes (Signed)
No answer, voicemail left at pt's home number (no mobile number found) to arrive at 1030 on Monday 07/15/13 for 1325 surgery. This nurse requested that pt call back to verify that he got message regarding correct time to arrive.

## 2013-07-12 NOTE — Progress Notes (Signed)
Pt's spouse called to  say they received the message to arrive at 1030 on Monday instead of 0900 due to schedule change.

## 2013-07-14 MED ORDER — CEFAZOLIN SODIUM-DEXTROSE 2-3 GM-% IV SOLR
2.0000 g | INTRAVENOUS | Status: AC
  Administered 2013-07-15: 2 g via INTRAVENOUS
  Filled 2013-07-14: qty 50

## 2013-07-15 ENCOUNTER — Inpatient Hospital Stay (HOSPITAL_COMMUNITY)
Admission: RE | Admit: 2013-07-15 | Discharge: 2013-07-16 | DRG: 460 | Disposition: A | Discharge status: Home / Self Care | Payer: Medicaid Other | Source: Ambulatory Visit | Attending: Neurosurgery | Admitting: Neurosurgery

## 2013-07-15 ENCOUNTER — Encounter (HOSPITAL_COMMUNITY)
Admission: RE | Disposition: A | Discharge status: Home / Self Care | Payer: Self-pay | Source: Ambulatory Visit | Attending: Neurosurgery

## 2013-07-15 ENCOUNTER — Ambulatory Visit (HOSPITAL_COMMUNITY): Payer: Medicaid Other | Admitting: Anesthesiology

## 2013-07-15 ENCOUNTER — Encounter (HOSPITAL_COMMUNITY): Payer: Self-pay | Admitting: *Deleted

## 2013-07-15 ENCOUNTER — Encounter (HOSPITAL_COMMUNITY): Payer: Medicaid Other | Admitting: Anesthesiology

## 2013-07-15 ENCOUNTER — Ambulatory Visit (HOSPITAL_COMMUNITY): Payer: Medicaid Other

## 2013-07-15 DIAGNOSIS — M51379 Other intervertebral disc degeneration, lumbosacral region without mention of lumbar back pain or lower extremity pain: Principal | ICD-10-CM | POA: Diagnosis present

## 2013-07-15 DIAGNOSIS — K219 Gastro-esophageal reflux disease without esophagitis: Secondary | ICD-10-CM | POA: Diagnosis present

## 2013-07-15 DIAGNOSIS — M129 Arthropathy, unspecified: Secondary | ICD-10-CM | POA: Diagnosis present

## 2013-07-15 DIAGNOSIS — E0590 Thyrotoxicosis, unspecified without thyrotoxic crisis or storm: Secondary | ICD-10-CM | POA: Diagnosis present

## 2013-07-15 DIAGNOSIS — M47817 Spondylosis without myelopathy or radiculopathy, lumbosacral region: Secondary | ICD-10-CM | POA: Diagnosis present

## 2013-07-15 DIAGNOSIS — I10 Essential (primary) hypertension: Secondary | ICD-10-CM | POA: Diagnosis present

## 2013-07-15 DIAGNOSIS — M47816 Spondylosis without myelopathy or radiculopathy, lumbar region: Secondary | ICD-10-CM | POA: Diagnosis present

## 2013-07-15 DIAGNOSIS — I252 Old myocardial infarction: Secondary | ICD-10-CM

## 2013-07-15 DIAGNOSIS — M5137 Other intervertebral disc degeneration, lumbosacral region: Principal | ICD-10-CM | POA: Diagnosis present

## 2013-07-15 SURGERY — POSTERIOR LUMBAR FUSION 1 WITH HARDWARE REMOVAL
Anesthesia: General | Site: Back

## 2013-07-15 MED ORDER — ACETAMINOPHEN 650 MG RE SUPP: 650.0000 mg | RECTAL | Status: DC | PRN

## 2013-07-15 MED ORDER — OXYCODONE HCL 5 MG PO TABS
ORAL_TABLET | ORAL | Status: AC
  Filled 2013-07-15: qty 1

## 2013-07-15 MED ORDER — ONDANSETRON HCL 4 MG/2ML IJ SOLN
INTRAMUSCULAR | Status: DC | PRN
  Administered 2013-07-15: 4 mg via INTRAVENOUS

## 2013-07-15 MED ORDER — EPHEDRINE SULFATE 50 MG/ML IJ SOLN
INTRAMUSCULAR | Status: DC | PRN
  Administered 2013-07-15: 5 mg via INTRAVENOUS
  Administered 2013-07-15 (×2): 10 mg via INTRAVENOUS

## 2013-07-15 MED ORDER — PROPOFOL 10 MG/ML IV BOLUS
INTRAVENOUS | Status: DC | PRN
  Administered 2013-07-15: 200 mg via INTRAVENOUS

## 2013-07-15 MED ORDER — FENTANYL CITRATE 0.05 MG/ML IJ SOLN
INTRAMUSCULAR | Status: AC
  Filled 2013-07-15: qty 5

## 2013-07-15 MED ORDER — HYDROCODONE-ACETAMINOPHEN 5-325 MG PO TABS: 1.0000 | ORAL_TABLET | ORAL | Status: DC | PRN

## 2013-07-15 MED ORDER — DOCUSATE SODIUM 100 MG PO CAPS
100.0000 mg | ORAL_CAPSULE | Freq: Two times a day (BID) | ORAL | Status: DC
  Administered 2013-07-15 – 2013-07-16 (×2): 100 mg via ORAL
  Filled 2013-07-15: qty 1

## 2013-07-15 MED ORDER — AMLODIPINE BESYLATE 5 MG PO TABS
5.0000 mg | ORAL_TABLET | Freq: Every day | ORAL | Status: DC
Start: 2013-07-16 — End: 2013-07-16
  Administered 2013-07-16: 5 mg via ORAL
  Filled 2013-07-15: qty 1

## 2013-07-15 MED ORDER — ALUM & MAG HYDROXIDE-SIMETH 200-200-20 MG/5ML PO SUSP: 30.0000 mL | Freq: Four times a day (QID) | ORAL | Status: DC | PRN

## 2013-07-15 MED ORDER — MENTHOL 3 MG MT LOZG: 1.0000 | LOZENGE | OROMUCOSAL | Status: DC | PRN

## 2013-07-15 MED ORDER — LEVOTHYROXINE SODIUM 100 MCG PO TABS
100.0000 ug | ORAL_TABLET | Freq: Every day | ORAL | Status: DC
  Administered 2013-07-16: 100 ug via ORAL
  Filled 2013-07-15 (×2): qty 1

## 2013-07-15 MED ORDER — OXYCODONE HCL 5 MG PO TABS
5.0000 mg | ORAL_TABLET | Freq: Once | ORAL | Status: AC | PRN
  Administered 2013-07-15: 5 mg via ORAL

## 2013-07-15 MED ORDER — BUPIVACAINE LIPOSOME 1.3 % IJ SUSP
20.0000 mL | Freq: Once | INTRAMUSCULAR | Status: DC
  Filled 2013-07-15: qty 20

## 2013-07-15 MED ORDER — ONDANSETRON HCL 4 MG/2ML IJ SOLN: 4.0000 mg | INTRAMUSCULAR | Status: DC | PRN

## 2013-07-15 MED ORDER — ACETAMINOPHEN 325 MG PO TABS: 650.0000 mg | ORAL_TABLET | ORAL | Status: DC | PRN

## 2013-07-15 MED ORDER — ROCURONIUM BROMIDE 50 MG/5ML IV SOLN
INTRAVENOUS | Status: AC
  Filled 2013-07-15: qty 1

## 2013-07-15 MED ORDER — MAGNESIUM OXIDE 400 (241.3 MG) MG PO TABS
200.0000 mg | ORAL_TABLET | Freq: Every day | ORAL | Status: DC
  Administered 2013-07-16: 200 mg via ORAL
  Filled 2013-07-15: qty 0.5

## 2013-07-15 MED ORDER — BUPIVACAINE LIPOSOME 1.3 % IJ SUSP
INTRAMUSCULAR | Status: DC | PRN
  Administered 2013-07-15: 20 mL

## 2013-07-15 MED ORDER — MIDAZOLAM HCL 2 MG/2ML IJ SOLN
INTRAMUSCULAR | Status: AC
  Filled 2013-07-15: qty 2

## 2013-07-15 MED ORDER — METOPROLOL TARTRATE 25 MG PO TABS
25.0000 mg | ORAL_TABLET | Freq: Two times a day (BID) | ORAL | Status: DC
  Administered 2013-07-15 – 2013-07-16 (×2): 25 mg via ORAL
  Filled 2013-07-15 (×3): qty 1

## 2013-07-15 MED ORDER — OXYCODONE HCL 5 MG/5ML PO SOLN: 5.0000 mg | Freq: Once | ORAL | Status: AC | PRN

## 2013-07-15 MED ORDER — HYDROMORPHONE HCL PF 1 MG/ML IJ SOLN
INTRAMUSCULAR | Status: AC
  Filled 2013-07-15: qty 1

## 2013-07-15 MED ORDER — BACITRACIN ZINC 500 UNIT/GM EX OINT
TOPICAL_OINTMENT | CUTANEOUS | Status: DC | PRN
  Administered 2013-07-15: 1 via TOPICAL

## 2013-07-15 MED ORDER — OXYCODONE HCL 5 MG PO TABS
15.0000 mg | ORAL_TABLET | ORAL | Status: DC | PRN
  Administered 2013-07-16 (×3): 15 mg via ORAL
  Filled 2013-07-15 (×3): qty 3

## 2013-07-15 MED ORDER — DEXAMETHASONE SODIUM PHOSPHATE 10 MG/ML IJ SOLN
INTRAMUSCULAR | Status: AC
  Filled 2013-07-15: qty 1

## 2013-07-15 MED ORDER — DOCUSATE SODIUM 100 MG PO CAPS: 100.0000 mg | ORAL_CAPSULE | Freq: Two times a day (BID) | ORAL | Status: DC

## 2013-07-15 MED ORDER — MAGNESIUM 250 MG PO TABS: 250.0000 mg | ORAL_TABLET | Freq: Every day | ORAL | Status: DC

## 2013-07-15 MED ORDER — LACTATED RINGERS IV SOLN
INTRAVENOUS | Status: DC | PRN
  Administered 2013-07-15 (×2): via INTRAVENOUS

## 2013-07-15 MED ORDER — THROMBIN 20000 UNITS EX SOLR
CUTANEOUS | Status: DC | PRN
  Administered 2013-07-15: 15:00:00 via TOPICAL

## 2013-07-15 MED ORDER — BUPIVACAINE-EPINEPHRINE PF 0.5-1:200000 % IJ SOLN
INTRAMUSCULAR | Status: DC | PRN
  Administered 2013-07-15: 10 mL via PERINEURAL

## 2013-07-15 MED ORDER — DIAZEPAM 5 MG PO TABS
ORAL_TABLET | ORAL | Status: AC
  Filled 2013-07-15: qty 1

## 2013-07-15 MED ORDER — PANTOPRAZOLE SODIUM 40 MG PO TBEC
80.0000 mg | DELAYED_RELEASE_TABLET | Freq: Every day | ORAL | Status: DC
  Administered 2013-07-16: 80 mg via ORAL
  Filled 2013-07-15: qty 2

## 2013-07-15 MED ORDER — OXYCODONE-ACETAMINOPHEN 5-325 MG PO TABS
1.0000 | ORAL_TABLET | ORAL | Status: DC | PRN
  Administered 2013-07-16: 2 via ORAL
  Filled 2013-07-15: qty 2

## 2013-07-15 MED ORDER — DIAZEPAM 5 MG PO TABS
5.0000 mg | ORAL_TABLET | Freq: Four times a day (QID) | ORAL | Status: DC | PRN
  Administered 2013-07-15 – 2013-07-16 (×2): 5 mg via ORAL
  Filled 2013-07-15: qty 1

## 2013-07-15 MED ORDER — 0.9 % SODIUM CHLORIDE (POUR BTL) OPTIME
TOPICAL | Status: DC | PRN
  Administered 2013-07-15: 1000 mL

## 2013-07-15 MED ORDER — CLONAZEPAM 1 MG PO TABS: 1.0000 mg | ORAL_TABLET | Freq: Two times a day (BID) | ORAL | Status: DC | PRN

## 2013-07-15 MED ORDER — METOCLOPRAMIDE HCL 5 MG/ML IJ SOLN: 10.0000 mg | Freq: Once | INTRAMUSCULAR | Status: DC | PRN

## 2013-07-15 MED ORDER — ROCURONIUM BROMIDE 100 MG/10ML IV SOLN
INTRAVENOUS | Status: DC | PRN
  Administered 2013-07-15: 50 mg via INTRAVENOUS

## 2013-07-15 MED ORDER — PHENOL 1.4 % MT LIQD: 1.0000 | OROMUCOSAL | Status: DC | PRN

## 2013-07-15 MED ORDER — ONDANSETRON HCL 4 MG/2ML IJ SOLN
INTRAMUSCULAR | Status: AC
  Filled 2013-07-15: qty 2

## 2013-07-15 MED ORDER — PROPOFOL 10 MG/ML IV BOLUS
INTRAVENOUS | Status: AC
  Filled 2013-07-15: qty 20

## 2013-07-15 MED ORDER — LIDOCAINE HCL (CARDIAC) 20 MG/ML IV SOLN
INTRAVENOUS | Status: DC | PRN
  Administered 2013-07-15: 100 mg via INTRAVENOUS

## 2013-07-15 MED ORDER — NITROGLYCERIN 0.4 MG SL SUBL: 0.4000 mg | SUBLINGUAL_TABLET | SUBLINGUAL | Status: DC | PRN

## 2013-07-15 MED ORDER — LACTATED RINGERS IV SOLN
INTRAVENOUS | Status: DC
  Administered 2013-07-15: 50 mL/h via INTRAVENOUS

## 2013-07-15 MED ORDER — MORPHINE SULFATE 2 MG/ML IJ SOLN
1.0000 mg | INTRAMUSCULAR | Status: DC | PRN
  Administered 2013-07-15: 2 mg via INTRAVENOUS
  Filled 2013-07-15: qty 1

## 2013-07-15 MED ORDER — LACTATED RINGERS IV SOLN: INTRAVENOUS | Status: DC

## 2013-07-15 MED ORDER — SODIUM CHLORIDE 0.9 % IR SOLN
Status: DC | PRN
  Administered 2013-07-15: 15:00:00

## 2013-07-15 MED ORDER — HYDROMORPHONE HCL PF 1 MG/ML IJ SOLN
0.2500 mg | INTRAMUSCULAR | Status: DC | PRN
  Administered 2013-07-15 (×2): 0.5 mg via INTRAVENOUS

## 2013-07-15 MED ORDER — ATORVASTATIN CALCIUM 40 MG PO TABS
40.0000 mg | ORAL_TABLET | Freq: Every day | ORAL | Status: DC
  Administered 2013-07-15: 40 mg via ORAL
  Filled 2013-07-15 (×2): qty 1

## 2013-07-15 MED ORDER — DEXAMETHASONE SODIUM PHOSPHATE 4 MG/ML IJ SOLN
INTRAMUSCULAR | Status: DC | PRN
  Administered 2013-07-15: 10 mg via INTRAVENOUS

## 2013-07-15 MED ORDER — LIDOCAINE HCL (CARDIAC) 20 MG/ML IV SOLN
INTRAVENOUS | Status: AC
  Filled 2013-07-15: qty 5

## 2013-07-15 MED ORDER — MIDAZOLAM HCL 5 MG/5ML IJ SOLN
INTRAMUSCULAR | Status: DC | PRN
  Administered 2013-07-15: 2 mg via INTRAVENOUS

## 2013-07-15 MED ORDER — CEFAZOLIN SODIUM-DEXTROSE 2-3 GM-% IV SOLR
2.0000 g | Freq: Three times a day (TID) | INTRAVENOUS | Status: AC
  Administered 2013-07-15 – 2013-07-16 (×2): 2 g via INTRAVENOUS
  Filled 2013-07-15 (×2): qty 50

## 2013-07-15 MED ORDER — FENTANYL CITRATE 0.05 MG/ML IJ SOLN
INTRAMUSCULAR | Status: DC | PRN
  Administered 2013-07-15 (×5): 50 ug via INTRAVENOUS

## 2013-07-15 SURGICAL SUPPLY — 76 items
APL SKNCLS STERI-STRIP NONHPOA (GAUZE/BANDAGES/DRESSINGS) ×1
BAG DECANTER FOR FLEXI CONT (MISCELLANEOUS) ×3 IMPLANT
BENZOIN TINCTURE PRP APPL 2/3 (GAUZE/BANDAGES/DRESSINGS) ×3 IMPLANT
BLADE SURG ROTATE 9660 (MISCELLANEOUS) IMPLANT
BRUSH SCRUB EZ PLAIN DRY (MISCELLANEOUS) ×3 IMPLANT
BUR ACORN 6.0 (BURR) ×2 IMPLANT
BUR ACORN 6.0MM (BURR) ×1
BUR MATCHSTICK NEURO 3.0 LAGG (BURR) ×3 IMPLANT
CANISTER SUCT 3000ML (MISCELLANEOUS) ×3 IMPLANT
CLOSURE WOUND 1/2 X4 (GAUZE/BANDAGES/DRESSINGS) ×1
CONT SPEC 4OZ CLIKSEAL STRL BL (MISCELLANEOUS) ×3 IMPLANT
COVER BACK TABLE 24X17X13 BIG (DRAPES) IMPLANT
DRAPE C-ARM 42X72 X-RAY (DRAPES) ×6 IMPLANT
DRAPE LAPAROTOMY 100X72X124 (DRAPES) ×3 IMPLANT
DRAPE POUCH INSTRU U-SHP 10X18 (DRAPES) ×3 IMPLANT
DRAPE PROXIMA HALF (DRAPES) ×3 IMPLANT
DRAPE SURG 17X23 STRL (DRAPES) ×12 IMPLANT
ELECT BLADE 4.0 EZ CLEAN MEGAD (MISCELLANEOUS) ×3
ELECT REM PT RETURN 9FT ADLT (ELECTROSURGICAL) ×3
ELECTRODE BLDE 4.0 EZ CLN MEGD (MISCELLANEOUS) ×1 IMPLANT
ELECTRODE REM PT RTRN 9FT ADLT (ELECTROSURGICAL) ×1 IMPLANT
EVACUATOR 1/8 PVC DRAIN (DRAIN) ×2 IMPLANT
GAUZE SPONGE 4X4 16PLY XRAY LF (GAUZE/BANDAGES/DRESSINGS) ×5 IMPLANT
GLOVE BIO SURGEON STRL SZ 6.5 (GLOVE) ×2 IMPLANT
GLOVE BIO SURGEON STRL SZ8.5 (GLOVE) ×6 IMPLANT
GLOVE BIO SURGEONS STRL SZ 6.5 (GLOVE) ×2
GLOVE BIOGEL M 6.5 STRL (GLOVE) ×8 IMPLANT
GLOVE BIOGEL PI IND STRL 6.5 (GLOVE) IMPLANT
GLOVE BIOGEL PI INDICATOR 6.5 (GLOVE) ×4
GLOVE EXAM NITRILE LRG STRL (GLOVE) IMPLANT
GLOVE EXAM NITRILE MD LF STRL (GLOVE) IMPLANT
GLOVE EXAM NITRILE XL STR (GLOVE) IMPLANT
GLOVE EXAM NITRILE XS STR PU (GLOVE) IMPLANT
GLOVE INDICATOR 7.0 STRL GRN (GLOVE) ×2 IMPLANT
GLOVE SS BIOGEL STRL SZ 8 (GLOVE) ×2 IMPLANT
GLOVE SUPERSENSE BIOGEL SZ 8 (GLOVE) ×4
GOWN BRE IMP SLV AUR LG STRL (GOWN DISPOSABLE) IMPLANT
GOWN BRE IMP SLV AUR XL STRL (GOWN DISPOSABLE) ×2 IMPLANT
GOWN STRL REIN 2XL LVL4 (GOWN DISPOSABLE) IMPLANT
GOWN STRL REUS W/ TWL LRG LVL3 (GOWN DISPOSABLE) IMPLANT
GOWN STRL REUS W/ TWL XL LVL3 (GOWN DISPOSABLE) IMPLANT
GOWN STRL REUS W/TWL LRG LVL3 (GOWN DISPOSABLE) ×6
GOWN STRL REUS W/TWL XL LVL3 (GOWN DISPOSABLE) ×9
KIT BASIN OR (CUSTOM PROCEDURE TRAY) ×3 IMPLANT
KIT ROOM TURNOVER OR (KITS) ×3 IMPLANT
NDL HYPO 21X1.5 SAFETY (NEEDLE) IMPLANT
NEEDLE HYPO 21X1.5 SAFETY (NEEDLE) ×3 IMPLANT
NEEDLE HYPO 22GX1.5 SAFETY (NEEDLE) ×3 IMPLANT
NS IRRIG 1000ML POUR BTL (IV SOLUTION) ×3 IMPLANT
PACK FOAM VITOSS 10CC (Orthopedic Implant) ×2 IMPLANT
PACK LAMINECTOMY NEURO (CUSTOM PROCEDURE TRAY) ×3 IMPLANT
PAD ARMBOARD 7.5X6 YLW CONV (MISCELLANEOUS) ×9 IMPLANT
PATTIES SURGICAL .5 X1 (DISPOSABLE) IMPLANT
PUTTY 10ML ACTIFUSE ABX (Putty) ×6 IMPLANT
ROD PREBENT 6.35X70 (Rod) ×4 IMPLANT
SCREW PEDICLE VA L635 6.5X45M (Screw) ×2 IMPLANT
SCREW PEDICLE VA L635 7.5X50M (Screw) ×2 IMPLANT
SCREW SET BREAK OFF (Screw) ×12 IMPLANT
SPACER CAPSTONE SPINE PK 14X26 (Spacer) ×4 IMPLANT
SPONGE GAUZE 4X4 12PLY (GAUZE/BANDAGES/DRESSINGS) ×3 IMPLANT
SPONGE LAP 4X18 X RAY DECT (DISPOSABLE) IMPLANT
SPONGE NEURO XRAY DETECT 1X3 (DISPOSABLE) IMPLANT
SPONGE SURGIFOAM ABS GEL 100 (HEMOSTASIS) ×3 IMPLANT
STRIP CLOSURE SKIN 1/2X4 (GAUZE/BANDAGES/DRESSINGS) ×2 IMPLANT
SUT VIC AB 1 CT1 18XBRD ANBCTR (SUTURE) ×2 IMPLANT
SUT VIC AB 1 CT1 8-18 (SUTURE) ×6
SUT VIC AB 2-0 CP2 18 (SUTURE) ×6 IMPLANT
SYR 20CC LL (SYRINGE) ×2 IMPLANT
SYR 20ML ECCENTRIC (SYRINGE) ×3 IMPLANT
TAPE CLOTH SURG 4X10 WHT LF (GAUZE/BANDAGES/DRESSINGS) ×2 IMPLANT
TAPE STRIPS DRAPE STRL (GAUZE/BANDAGES/DRESSINGS) ×2 IMPLANT
TOWEL OR 17X24 6PK STRL BLUE (TOWEL DISPOSABLE) ×3 IMPLANT
TOWEL OR 17X26 10 PK STRL BLUE (TOWEL DISPOSABLE) ×3 IMPLANT
TRAY FOLEY CATH 14FRSI W/METER (CATHETERS) ×1 IMPLANT
TRAY FOLEY CATH 16FRSI W/METER (SET/KITS/TRAYS/PACK) ×2 IMPLANT
WATER STERILE IRR 1000ML POUR (IV SOLUTION) ×3 IMPLANT

## 2013-07-15 NOTE — H&P (Signed)
Subjective: The patient is a 48 year old black male on whom I previously performed an L5-S1 decompression and fusion. The patient has developed recurrent back, buttock, and leg pain consistent with lumbar radiculopathy/neurogenic claudication. He has failed medical management and was worked up with a lumbar MRI. This demonstrated patient had spinal stenosis at L4-5 I discussed the various treatment option with the patient including surgery. He has weighed the risks, benefits, and alternatives surgery and decided proceed with an exploration of lumbar fusion with an L4-5 decompression, instrumentation, and fusion.   Past Medical History  Diagnosis Date  . HYPERTENSION 03/16/2010  . HYPERTHYROIDISM 03/16/2010    s/p RAI  . THYROID STORM 03/16/2010  . Altered mental status 03/16/2010  . Hypothyroidism following radioiodine therapy   . NSTEMI (non-ST elevated myocardial infarction)     05/2012:  LHC with normal EF and no CAD (? spasm)  . GERD (gastroesophageal reflux disease)   . Arthritis     Past Surgical History  Procedure Laterality Date  . Back surgery  03/2009, 10/2009  . Cervical disc surgery  10  . Shoulder arthroscopy w/ rotator cuff repair Right [id number]    athroscopy debridement, 2nd rotator cuff  . Hernia repair      umbilical kid  . Posterior cervical fusion/foraminotomy N/A 12/10/2012    Procedure: POSTERIOR CERVICAL FUSION/FORAMINOTOMY LEVEL 1;  Surgeon: Ophelia Charter, MD;  Location: [place] [facility];  Service: Neurosurgery;  Laterality: N/A;  Cervical three-four posterior cervical fusion with lateral mass screws    No Known Allergies  History  Substance Use Topics  . Smoking status: Never Smoker   . Smokeless tobacco: Not on file     Comment: no alcohol now  . Alcohol Use: No    Family History  Problem Relation Age of Onset  . Thyroid disease Mother     hypothyroidism  . Arthritis Other   . Hypertension Other   . Heart disease Other     Parents, Grandparents  .  Diabetes Other     Grandparents   Prior to Admission medications   Medication Sig Start Date End Date Taking? Authorizing Provider  amLODipine (NORVASC) 5 MG tablet Take 1 tablet (5 mg total) by mouth daily. 05/07/13  Yes Darlin Coco, MD  atorvastatin (LIPITOR) 40 MG tablet Take 40 mg by mouth at bedtime.    Yes Historical Provider, MD  clonazePAM (KLONOPIN) 1 MG tablet Take 1 mg by mouth 2 (two) times daily as needed for anxiety.   Yes Historical Provider, MD  diazepam (VALIUM) 5 MG tablet Take 5 mg by mouth every 6 (six) hours as needed for anxiety. 12/11/12  Yes Ophelia Charter, MD  diclofenac sodium (VOLTAREN) 1 % GEL Apply 2 g topically 4 (four) times daily.   Yes Historical Provider, MD  levothyroxine (SYNTHROID, LEVOTHROID) 100 MCG tablet Take 1 tablet (100 mcg total) by mouth daily before breakfast. 06/25/13  Yes Renato Shin, MD  Magnesium 250 MG TABS Take 250 mg by mouth daily.   Yes Historical Provider, MD  metoprolol (LOPRESSOR) 50 MG tablet Take 0.5 tablets (25 mg total) by mouth 2 (two) times daily. 07/17/12  Yes Liliane Shi, PA-C  omega-3 acid ethyl esters (LOVAZA) 1 G capsule Take 1 g by mouth daily.   Yes Historical Provider, MD  omeprazole (PRILOSEC) 40 MG capsule Take 40 mg by mouth daily.   Yes Historical Provider, MD  oxyCODONE (ROXICODONE) 15 MG immediate release tablet Take 15 mg by mouth every 4 (four)  hours as needed for pain.   Yes Historical Provider, MD  oxyCODONE-acetaminophen (PERCOCET) 10-325 MG per tablet Take 1 tablet by mouth every 4 (four) hours as needed for pain.    Yes Historical Provider, MD  POTASSIUM PO Take 1 tablet by mouth daily.   Yes Historical Provider, MD  triamcinolone cream (KENALOG) 0.1 % Apply 1 application topically 2 (two) times daily as needed (for feet).   Yes Historical Provider, MD  docusate sodium 100 MG CAPS Take 100 mg by mouth 2 (two) times daily. 12/11/12   Ophelia Charter, MD  nitroGLYCERIN (NITROSTAT) 0.4 MG SL tablet Place 1  tablet (0.4 mg total) under the tongue every 5 (five) minutes as needed for chest pain. 10/04/12   Liliane Shi, PA-C     Review of Systems  Positive ROS: As above  All other systems have been reviewed and were otherwise negative with the exception of those mentioned in the HPI and as above.  Objective: Vital signs in last 24 hours: Temp:  [97.7 F (36.5 C)] 97.7 F (36.5 C) (03/02 1028) Pulse Rate:  [77] 77 (03/02 1028) Resp:  [20] 20 (03/02 1028) BP: (134)/(83) 134/83 mmHg (03/02 1028) SpO2:  [100 %] 100 % (03/02 1028)  General Appearance: Alert, cooperative, no distress, Head: Normocephalic, without obvious abnormality, atraumatic Eyes: PERRL, conjunctiva/corneas clear, EOM's intact,    Ears: Normal  Throat: Normal  Neck: Supple, symmetrical, trachea midline, no adenopathy; thyroid: No enlargement/tenderness/nodules; no carotid bruit or JVD Back: Symmetric, no curvature, ROM normal, no CVA tenderness. The patient's lumbar incision is well-healed. Lungs: Clear to auscultation bilaterally, respirations unlabored Heart: Regular rate and rhythm, no murmur, rub or gallop Abdomen: Soft, non-tender,, no masses, no organomegaly Extremities: Extremities normal, atraumatic, no cyanosis or edema Pulses: 2+ and symmetric all extremities Skin: Skin color, texture, turgor normal, no rashes or lesions  NEUROLOGIC:   Mental status: alert and oriented, no aphasia, good attention span, Fund of knowledge/ memory ok Motor Exam - grossly normal Sensory Exam - grossly normal Reflexes:  Coordination - grossly normal Gait - grossly normal Balance - grossly normal Cranial Nerves: I: smell Not tested  II: visual acuity  OS: Normal  OD: Normal   II: visual fields Full to confrontation  II: pupils Equal, round, reactive to light  III,VII: ptosis None  III,IV,VI: extraocular muscles  Full ROM  V: mastication Normal  V: facial light touch sensation  Normal  V,VII: corneal reflex  Present   VII: facial muscle function - upper  Normal  VII: facial muscle function - lower Normal  VIII: hearing Not tested  IX: soft palate elevation  Normal  IX,X: gag reflex Present  XI: trapezius strength  5/5  XI: sternocleidomastoid strength 5/5  XI: neck flexion strength  5/5  XII: tongue strength  Normal    Data Review Lab Results  Component Value Date   WBC 9.4 07/09/2013   HGB 15.7 07/09/2013   HCT 47.9 07/09/2013   MCV 87.4 07/09/2013   PLT 175 07/09/2013   Lab Results  Component Value Date   NA 141 07/09/2013   K 4.7 07/09/2013   CL 101 07/09/2013   CO2 27 07/09/2013   BUN 9 07/09/2013   CREATININE 1.09 07/09/2013   GLUCOSE 91 07/09/2013   Lab Results  Component Value Date   INR 1.05 06/12/2012    Assessment/Plan: 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 I reviewed his MR  scan with them and pointed out the abnormalities. We have discussed the various treatment options including an exploration of her lumbar fusion with an L4-5 decompression, instrumentation, and fusion. I described the surgery to him. I have shown him surgical models. We have discussed the risks, benefits, alternatives, and likelihood of achieving our goals with surgery. I have answered all the patient's questions. He has decided proceed with surgery.   Jeff [name] D 07/15/2013 1:19 PM

## 2013-07-15 NOTE — Anesthesia Preprocedure Evaluation (Addendum)
Anesthesia Evaluation  Patient identified by MRN, date of birth, ID band Patient awake    Reviewed: Allergy & Precautions, H&P , NPO status , Patient's Chart, lab work & pertinent test results, reviewed documented beta blocker date and time   Airway Mallampati: II TM Distance: >3 FB Neck ROM: full    Dental   Pulmonary neg pulmonary ROS,  breath sounds clear to auscultation        Cardiovascular hypertension, On Medications and On Home Beta Blockers + Past MI Rhythm:regular     Neuro/Psych negative neurological ROS  negative psych ROS   GI/Hepatic Neg liver ROS, GERD-  Medicated and Controlled,  Endo/Other  Hypothyroidism Hyperthyroidism   Renal/GU negative Renal ROS  negative genitourinary   Musculoskeletal   Abdominal   Peds  Hematology negative hematology ROS (+)   Anesthesia Other Findings See surgeon's H&P   Reproductive/Obstetrics negative OB ROS                          Anesthesia Physical Anesthesia Plan  ASA: III  Anesthesia Plan: General   Post-op Pain Management:    Induction: Intravenous  Airway Management Planned: Oral ETT and Video Laryngoscope Planned  Additional Equipment:   Intra-op Plan:   Post-operative Plan: Extubation in OR  Informed Consent: I have reviewed the patients History and Physical, chart, labs and discussed the procedure including the risks, benefits and alternatives for the proposed anesthesia with the patient or authorized representative who has indicated his/her understanding and acceptance.   Dental Advisory Given  Plan Discussed with: CRNA and Surgeon  Anesthesia Plan Comments:        Anesthesia Quick Evaluation

## 2013-07-15 NOTE — Op Note (Signed)
Brief history: The patient is a 48 year old black male on whom I previously performed a L5-S1 instrumentation and fusion. The patient has had some persistent back pain but it has been getting progressively worse. He failed medical management and was worked up with a lumbar MRI. This demonstrated the patient had severe facet arthropathy at L4-5. I discussed the various treatment options with the patient including surgery. The patient has weighed the risks, benefits, and alternatives surgery and decided proceed with an L4-5 agitation and fusion.  Preoperative diagnosis: L4-5 facet arthropathy,  lumbago; lumbar radiculopathy  Postoperative diagnosis: The same  Procedure: L4-5 posterior lumbar interbody fusion with local morselized autograft bone and Actifusebone graft extender; insertion of interbody prosthesis at L4-5 (Medtronic peek interbody prosthesis); posterior segmental instrumentation from L4 to S1 with Medtronic Legacy titanium pedicle screws and rods; posterior lateral arthrodesis at L4-5 with local morselized autograft bone and Vitoss bone graft extender.  Surgeon: Dr. Earle Gell  Asst.: Dr. Leeroy Cha  Anesthesia: Gen. endotracheal  Estimated blood loss: 200 cc  Drains: One medium Hemovac  Complications: None  Description of procedure: The patient was brought to the operating room by the anesthesia team. General endotracheal anesthesia was induced. The patient was turned to the prone position on the Wilson frame. The patient's lumbosacral region was then prepared with Betadine scrub and Betadine solution. Sterile drapes were applied.  I then injected the area to be incised with Marcaine with epinephrine solution. I then used the scalpel to make a linear midline incision over the L4-5 interspace. I then used electrocautery to perform a bilateral subperiosteal dissection exposing the spinous process and lamina of L4-L5 the upper sacrum. We also used electrocautery to expose the  hardware at L5-S1.. I explored the previous fusion by removing the cast reveals screws, removed the rods, and by independently try to move the pedicle screws. There appears solid in the arthrodesis appears solid.  I used the high speed drill to perform laminotomies at L4 bilaterally. We then used the Kerrison punches to widen the laminotomy and removed the ligamentum flavum at L4-5. We used the Kerrison punches to remove the medial facets at L4-5. We performed wide foraminotomies about the bilateral L4 and L5 nerve roots completing the decompression.  We now turned our attention to the posterior lumbar interbody fusion. I used a scalpel to incise the intervertebral disc at L4-5. I then performed a partial intervertebral discectomy at L4-5 using the pituitary forceps. We prepared the vertebral endplates at K0-2 for the fusion by removing the soft tissues with the curettes. We then used the trial spacers to pick the appropriate sized interbody prosthesis. We prefilled his prosthesis with a combination of local morselized autograft bone that we obtained during the decompression as well as Actifuse bone graft extender. We inserted the prefilled prosthesis into the interspace at L4-5. There was a good snug fit of the prosthesis in the interspace. We then filled and the remainder of the intervertebral disc space with local morselized autograft bone and Actifuse. This completed the posterior lumbar interbody arthrodesis.  We now turned attention to the instrumentation. Under fluoroscopic guidance we cannulated the bilateral L4 pedicles with the bone probe. We then removed the bone probe. We then tapped the pedicle with a type 5.5 and 6.5 millimeter tap. We then removed the tap. We probed inside the tapped pedicle with a ball probe to rule out cortical breaches. We then inserted a 6.5 x 45 and 7.5 x 50 millimeter pedicle screw into the L4  pedicles bilaterally under fluoroscopic guidance. We then palpated along the  medial aspect of the pedicles to rule out cortical breaches. There were none. The nerve roots were not injured. We then connected the unilateral pedicle screws from L4-S1 with a lordotic rod. We compressed the construct and secured the rod in place with the caps. We then tightened the caps appropriately. This completed the instrumentation from L4-S1.  We now turned our attention to the posterior lateral arthrodesis at L4-5. We used the high-speed drill to decorticate the remainder of the facets, pars, transverse process at L4-5. We then applied a combination of local morselized autograft bone and Vitoss bone graft extender over these decorticated posterior lateral structures. This completed the posterior lateral arthrodesis.  We then obtained hemostasis using bipolar electrocautery. We irrigated the wound out with bacitracin solution. We inspected the thecal sac and nerve roots and noted they were well decompressed. We then removed the retractor. We placed a medium Hemovac drain in the epidural space and tunneled out through separate stab wound. We reapproximated patient's thoracolumbar fascia with interrupted #1 Vicryl suture. We reapproximated patient's subcutaneous tissue with interrupted 2-0 Vicryl suture. The reapproximated patient's skin with Steri-Strips and benzoin. The wound was then coated with bacitracin ointment. A sterile dressing was applied. The drapes were removed. The patient was subsequently returned to the supine position where they were extubated by the anesthesia team. He was then transported to the post anesthesia care unit in stable condition. All sponge instrument and needle counts were reportedly correct at the end of this case.

## 2013-07-15 NOTE — Progress Notes (Signed)
Patient ID: Jeff [name], male   DOB: July 13, 1965, 48 y.o.   MRN: [id number] Subjective:  The patient is alert and pleasant. He looks well. He is in no apparent distress.  Objective: Vital signs in last 24 hours: Temp:  [97.7 F (36.5 C)] 97.7 F (36.5 C) (03/02 1735) Pulse Rate:  [77-123] 123 (03/02 1745) Resp:  [20-27] 22 (03/02 1745) BP: (110-134)/(67-84) 110/67 mmHg (03/02 1745) SpO2:  [100 %] 100 % (03/02 1745)  Intake/Output from previous day:   Intake/Output this shift: Total I/O In: 1800 [I.V.:1800] Out: 300 [Urine:200; Blood:100]  Physical exam the patient is alert and pleasant. He is moving his lower extremities well.  Lab Results: No results found for this basename: WBC, HGB, HCT, PLT,  in the last 72 hours BMET No results found for this basename: NA, K, CL, CO2, GLUCOSE, BUN, CREATININE, CALCIUM,  in the last 72 hours  Studies/Results: Dg Lumbar Spine 2-3 Views  07/15/2013   CLINICAL DATA:  Lumbar fusion  EXAM: LUMBAR SPINE - 2-3 VIEW; DG C-ARM 1-60 MIN  COMPARISON:  03/19/2013  FINDINGS: Two intraoperative fluoroscopic spot images document placement of bilateral pedicle screws at L4. Interval placement of graft markers in the L4-5 interspace. Previous changes of L5-S1 fusion.  IMPRESSION: Extension of fusion across L4-5 as above.   Electronically Signed   By: Arne Cleveland M.D.   On: 07/15/2013 17:00   Dg C-arm 1-60 Min  07/15/2013   CLINICAL DATA:  Lumbar fusion  EXAM: LUMBAR SPINE - 2-3 VIEW; DG C-ARM 1-60 MIN  COMPARISON:  03/19/2013  FINDINGS: Two intraoperative fluoroscopic spot images document placement of bilateral pedicle screws at L4. Interval placement of graft markers in the L4-5 interspace. Previous changes of L5-S1 fusion.  IMPRESSION: Extension of fusion across L4-5 as above.   Electronically Signed   By: Arne Cleveland M.D.   On: 07/15/2013 17:00    Assessment/Plan: The patient is doing well. I spoke with his wife.  LOS: 0 days      Lajuana Patchell D 07/15/2013, 6:17 PM

## 2013-07-15 NOTE — Anesthesia Postprocedure Evaluation (Signed)
  Anesthesia Post-op Note  Patient: Jeff [name]  Procedure(s) Performed: Procedure(s) with comments: Exploration of fusion with Lumbar four-five laminectomy posterior lumbar interbody fusion with interbody prosthesis posterior lateral arthrodesis and posterior segmental instrumentation (N/A) - Exploration of fusion with Lumbar four-five laminectomy posterior lumbar interbody fusion with interbody prosthesis posterior lateral arthrodesis and posterior segmental instrumentation  Patient Location: PACU  Anesthesia Type:General  Level of Consciousness: awake and alert   Airway and Oxygen Therapy: Patient Spontanous Breathing  Post-op Pain: mild  Post-op Assessment: Post-op Vital signs reviewed  Post-op Vital Signs: stable  Complications: No apparent anesthesia complications

## 2013-07-15 NOTE — Transfer of Care (Signed)
Immediate Anesthesia Transfer of Care Note  Patient: Jeff [name]  Procedure(s) Performed: Procedure(s) with comments: Exploration of fusion with Lumbar four-five laminectomy posterior lumbar interbody fusion with interbody prosthesis posterior lateral arthrodesis and posterior segmental instrumentation (N/A) - Exploration of fusion with Lumbar four-five laminectomy posterior lumbar interbody fusion with interbody prosthesis posterior lateral arthrodesis and posterior segmental instrumentation  Patient Location: PACU  Anesthesia Type:General  Level of Consciousness: awake, alert  and oriented  Airway & Oxygen Therapy: Patient Spontanous Breathing and Patient connected to nasal cannula oxygen  Post-op Assessment: Report given to PACU RN, Post -op Vital signs reviewed and stable and Patient moving all extremities  Post vital signs: Reviewed and stable  Complications: No apparent anesthesia complications

## 2013-07-15 NOTE — Progress Notes (Signed)
Pt arrived on the unit. Family in the room. Transferred to bed, foley in place, SCD's on, bed alarm turned on. Pt. Alert and oriented. Eager to get up and walk tonight. Pain controlled at a 3/10. Oriented to room and surroundings. Report taken from [place], RN from PACU. Avey Mcmanamon, Rande Brunt, RN

## 2013-07-15 NOTE — Preoperative (Signed)
Beta Blockers   Reason not to administer Beta Blockers:Metoprolol taken by patient on morning of 07/15/2013 at unknown time.

## 2013-07-16 LAB — CBC
HCT: 41.5 % (ref 39.0–52.0)
Hemoglobin: 13.5 g/dL (ref 13.0–17.0)
MCH: 28.4 pg (ref 26.0–34.0)
MCHC: 32.5 g/dL (ref 30.0–36.0)
MCV: 87.2 fL (ref 78.0–100.0)
Platelets: 160 10*3/uL (ref 150–400)
RBC: 4.76 MIL/uL (ref 4.22–5.81)
RDW: 13.7 % (ref 11.5–15.5)
WBC: 12 10*3/uL — ABNORMAL HIGH (ref 4.0–10.5)

## 2013-07-16 LAB — BASIC METABOLIC PANEL
BUN: 13 mg/dL (ref 6–23)
CO2: 24 mEq/L (ref 19–32)
Calcium: 8.8 mg/dL (ref 8.4–10.5)
Chloride: 101 mEq/L (ref 96–112)
Creatinine, Ser: 1.18 mg/dL (ref 0.50–1.35)
GFR calc Af Amer: 84 mL/min — ABNORMAL LOW (ref >90)
GFR calc non Af Amer: 72 mL/min — ABNORMAL LOW (ref >90)
Glucose, Bld: 176 mg/dL — ABNORMAL HIGH (ref 70–99)
Potassium: 5.6 mEq/L — ABNORMAL HIGH (ref 3.7–5.3)
Sodium: 138 mEq/L (ref 137–147)

## 2013-07-16 MED ORDER — DIAZEPAM 5 MG PO TABS: 5.0000 mg | ORAL_TABLET | Freq: Four times a day (QID) | ORAL | Status: DC | PRN

## 2013-07-16 MED ORDER — OXYCODONE HCL 15 MG PO TABS: 15.0000 mg | ORAL_TABLET | ORAL | Status: DC | PRN

## 2013-07-16 MED ORDER — DSS 100 MG PO CAPS: 100.0000 mg | ORAL_CAPSULE | Freq: Two times a day (BID) | ORAL | Status: DC

## 2013-07-16 NOTE — Progress Notes (Signed)
Larkin Ina with [employer] called to deliver walker/ 3;1 to room prior to discharge home today; Aneta Mins [phone]

## 2013-07-16 NOTE — Evaluation (Signed)
Occupational Therapy Evaluation Patient Details Name: Jeff [name] MRN: [id number] DOB: November 11, 1965 Today's Date: 07/16/2013 Time: 7824-2353 OT Time Calculation (min): 28 min  OT Assessment / Plan / Recommendation History of present illness 48 y.o. s/p Exploration of fusion with Lumbar four-five laminectomy posterior lumbar interbody fusion with interbody prosthesis posterior lateral arthrodesis and posterior segmental instrumentation (N/A) - Exploration of fusion with Lumbar four-five laminectomy posterior lumbar interbody fusion with interbody prosthesis posterior lateral arthrodesis and posterior segmental instrumentation   Clinical Impression   Pt moving well during session. Education provided to pt and feel he is safe to d/c home, from OT standpoint.    OT Assessment  Patient does not need any further OT services    Follow Up Recommendations  No OT follow up;Supervision - Intermittent    Barriers to Discharge      Equipment Recommendations  3 in 1 bedside comode;Other (comment) (Rolling walker-2 wheels)    Recommendations for Other Services    Frequency       Precautions / Restrictions Precautions Precautions: Back Precaution Booklet Issued: Yes (comment) Precaution Comments: Reviewed precautions with pt. Required Braces or Orthoses: Spinal Brace Spinal Brace: Lumbar corset;Applied in sitting position Restrictions Weight Bearing Restrictions: No   Pertinent Vitals/Pain Pain 7/10. Increased activity during session.    ADL  Lower Body Dressing: Minimal assistance Where Assessed - Lower Body Dressing: Unsupported sit to stand Toilet Transfer: Supervision/safety Toilet Transfer Method: Sit to stand Toilet Transfer Equipment: Bedside commode Toileting - Clothing Manipulation and Hygiene: Supervision/safety Where Assessed - Best boy and Hygiene: Standing Tub/Shower Transfer: Nurse, learning disability Method: Chartered loss adjuster: Other (comment) (practiced stepping over; 3 in 1) Equipment Used: Gait belt;Back brace;Long-handled shoe horn;Long-handled sponge;Reacher;Rolling walker;Sock aid;Other (comment) (toilet aid) Transfers/Ambulation Related to ADLs: Supervision for ambulation; Min guard for tub transfer; Supervision for sit <> stand transfers. ADL Comments: Educated on use of cups for teeth care and placement of grooming items to avoid breaking precautions. Educated on AE for LB ADLs and pt practiced with reacher and sockaid with donning/doffing sock. Educated on back brace and bed mobility. Practiced tub transfer by stepping over and OT demonstrated tub transfer of backing to 3 in 1. Recommended wife being with him for tub transfer.  Recommended wife have rugs picked up and educated on safe shoewear.  Pt performed stairs during session.    OT Diagnosis:    OT Problem List:   OT Treatment Interventions:     OT Goals(Current goals can be found in the care plan section)    Visit Information  Last OT Received On: 07/16/13 Assistance Needed: +1 History of Present Illness: 48 y.o. s/p Exploration of fusion with Lumbar four-five laminectomy posterior lumbar interbody fusion with interbody prosthesis posterior lateral arthrodesis and posterior segmental instrumentation (N/A) - Exploration of fusion with Lumbar four-five laminectomy posterior lumbar interbody fusion with interbody prosthesis posterior lateral arthrodesis and posterior segmental instrumentation       Prior Functioning     Home Living Family/patient expects to be discharged to:: Private residence Living Arrangements: Spouse/significant other Available Help at Discharge: Family (mother staying with him a few days; wife works) Type of Home: Apartment Home Access: Stairs to enter Technical brewer of Steps: 2 Entrance Stairs-Rails: Right;Left;Can reach both Home Layout: One level [place]: [place] - single point Prior  Function Level of Independence: Independent with assistive device(s) Comments: used cane occassionally Communication Communication: No difficulties         Vision/Perception  Cognition  Cognition Arousal/Alertness: Awake/alert Behavior During Therapy: WFL for tasks assessed/performed Overall Cognitive Status: Within Functional Limits for tasks assessed    Extremity/Trunk Assessment Upper Extremity Assessment Upper Extremity Assessment: Overall WFL for tasks assessed Lower Extremity Assessment Lower Extremity Assessment: Overall WFL for tasks assessed     Mobility Bed Mobility Overal bed mobility: Needs Assistance Bed Mobility: Rolling;Sidelying to Sit;Sit to Sidelying Rolling: Supervision Sidelying to sit: Supervision Sit to sidelying: Supervision General bed mobility comments: Cues for technique. No rail Transfers Overall transfer level: Needs assistance Equipment used: Rolling walker (2 wheeled);None Transfers: Sit to/from Stand Sit to Stand: Supervision General transfer comment: Cues for hand placement.     Exercise     Balance     End of Session OT - End of Session Equipment Utilized During Treatment: Gait belt;Rolling walker;Back brace Activity Tolerance: Patient tolerated treatment well Patient left: in chair;with call bell/phone within reach Nurse Communication: Other (comment) (drain came out; DME needs)  GO     Benito Mccreedy OTR/L [phone] 07/16/2013, 11:13 AM

## 2013-07-16 NOTE — Discharge Summary (Signed)
Physician Discharge Summary  Patient ID: Jeff [name] MRN: [id number] DOB/AGE: 08-02-1965 48 y.o.  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  The patient's postoperative course was unremarkable. On 07/16/2013 the patient requested discharge to home. He was given oral and written discharge instructions. All his questions were answered.  Consults: None Significant Diagnostic Studies: None Treatments: L4-5 instrumentation and fusion. Discharge Exam: Blood pressure 110/63, pulse 88, temperature 98 F (36.7 C), temperature source Oral, resp. rate 20, SpO2 100.00%. Patient is alert and pleasant. His dressing is clean and dry. His strength is normal.  Disposition: Home  Discharge Orders   Future Orders Complete By Expires   Call MD for:  difficulty breathing, headache or visual disturbances  As directed    Call MD for:  extreme fatigue  As directed    Call MD for:  hives  As directed    Call MD for:  persistant dizziness or light-headedness  As directed    Call MD for:  persistant nausea and vomiting  As directed    Call MD for:  redness, tenderness, or signs of infection (pain, swelling, redness, odor or green/yellow discharge around incision site)  As directed    Call MD for:  severe uncontrolled pain  As directed    Call MD for:  temperature >100.4  As directed    Diet - low sodium heart healthy  As directed    Discharge instructions  As directed    Comments:     Call [phone] for a followup appointment. Take a stool softener while you are using pain medications.   Driving Restrictions  As directed    Comments:     Do not drive for 2 weeks.    Increase activity slowly  As directed    Lifting restrictions  As directed    Comments:     Do not lift more than 5 pounds. No excessive bending or twisting.   May shower / Bathe  As directed    Comments:     He may shower after the pain she is removed 3 days after surgery. Leave the incision alone.   Remove dressing in 48 hours  As directed    Comments:     Your stitches are under the scan and will dissolve by themselves. The Steri-Strips will fall off after you take a few showers. Do not rub back or pick at the wound, Leave the wound alone.       Medication List    STOP taking these medications       diclofenac sodium 1 % Gel  Commonly known as:  VOLTAREN     oxyCODONE-acetaminophen 10-325 MG per tablet  Commonly known as:  PERCOCET      TAKE these medications       amLODipine 5 MG tablet  Commonly known as:  NORVASC  Take 1 tablet (5 mg total) by mouth daily.     atorvastatin 40 MG tablet  Commonly known as:  LIPITOR  Take 40 mg by mouth at bedtime.     clonazePAM 1 MG tablet  Commonly known as:  KLONOPIN  Take 1 mg by mouth 2 (two) times daily as needed for anxiety.     diazepam  5 MG tablet  Commonly known as:  VALIUM  Take 5 mg by mouth every 6 (six) hours as needed for anxiety.     diazepam 5 MG tablet  Commonly known as:  VALIUM  Take 1 tablet (5 mg total) by mouth every 6 (six) hours as needed for muscle spasms.     DSS 100 MG Caps  Take 100 mg by mouth 2 (two) times daily.     DSS 100 MG Caps  Take 100 mg by mouth 2 (two) times daily.     levothyroxine 100 MCG tablet  Commonly known as:  SYNTHROID, LEVOTHROID  Take 1 tablet (100 mcg total) by mouth daily before breakfast.     Magnesium 250 MG Tabs  Take 250 mg by mouth daily.     metoprolol 50 MG tablet  Commonly known as:  LOPRESSOR  Take 0.5 tablets (25 mg total) by mouth 2 (two) times daily.     nitroGLYCERIN 0.4 MG SL tablet  Commonly known as:  NITROSTAT  Place 1 tablet (0.4 mg total)  under the tongue every 5 (five) minutes as needed for chest pain.     omega-3 acid ethyl esters 1 G capsule  Commonly known as:  LOVAZA  Take 1 g by mouth daily.     omeprazole 40 MG capsule  Commonly known as:  PRILOSEC  Take 40 mg by mouth daily.     oxyCODONE 15 MG immediate release tablet  Commonly known as:  ROXICODONE  Take 15 mg by mouth every 4 (four) hours as needed for pain.     oxyCODONE 15 MG immediate release tablet  Commonly known as:  ROXICODONE  Take 1 tablet (15 mg total) by mouth every 4 (four) hours as needed for moderate pain.     POTASSIUM PO  Take 1 tablet by mouth daily.     triamcinolone cream 0.1 %  Commonly known as:  KENALOG  Apply 1 application topically 2 (two) times daily as needed (for feet).         SignedOphelia Charter 07/16/2013, 8:20 AM

## 2013-07-16 NOTE — Progress Notes (Signed)
PT Cancellation Note  Patient Details Name: Jeff [name] MRN: [id number] DOB: 1966/04/14   Cancelled Treatment:    Reason Eval/Treat Not Completed: PT screened, no needs identified, will sign off.   Duncan Dull 07/16/2013, 12:03 PM Alben Deeds, [place] DPT  [phone]

## 2013-07-17 MED FILL — Heparin Sodium (Porcine) Inj 1000 Unit/ML: INTRAMUSCULAR | Qty: 30 | Status: AC

## 2013-07-17 MED FILL — Sodium Chloride IV Soln 0.9%: INTRAVENOUS | Qty: 1000 | Status: AC

## 2013-12-03 ENCOUNTER — Other Ambulatory Visit: Payer: Self-pay | Admitting: Cardiology

## 2014-01-08 ENCOUNTER — Other Ambulatory Visit: Payer: Self-pay | Admitting: Cardiology

## 2014-01-28 ENCOUNTER — Ambulatory Visit (INDEPENDENT_AMBULATORY_CARE_PROVIDER_SITE_OTHER): Payer: Medicaid Other | Admitting: Physician Assistant

## 2014-01-28 ENCOUNTER — Encounter: Payer: Self-pay | Admitting: Physician Assistant

## 2014-01-28 ENCOUNTER — Other Ambulatory Visit (HOSPITAL_COMMUNITY): Payer: Self-pay

## 2014-01-28 ENCOUNTER — Ambulatory Visit (HOSPITAL_COMMUNITY): Payer: Medicaid Other | Attending: Cardiology | Admitting: Cardiology

## 2014-01-28 VITALS — BP 130/90 | HR 70 | Ht 70.0 in | Wt 249.0 lb

## 2014-01-28 DIAGNOSIS — I10 Essential (primary) hypertension: Secondary | ICD-10-CM | POA: Diagnosis not present

## 2014-01-28 DIAGNOSIS — E785 Hyperlipidemia, unspecified: Secondary | ICD-10-CM

## 2014-01-28 DIAGNOSIS — I214 Non-ST elevation (NSTEMI) myocardial infarction: Secondary | ICD-10-CM

## 2014-01-28 DIAGNOSIS — R0989 Other specified symptoms and signs involving the circulatory and respiratory systems: Secondary | ICD-10-CM | POA: Diagnosis present

## 2014-01-28 DIAGNOSIS — I70219 Atherosclerosis of native arteries of extremities with intermittent claudication, unspecified extremity: Secondary | ICD-10-CM | POA: Diagnosis present

## 2014-01-28 DIAGNOSIS — M79662 Pain in left lower leg: Secondary | ICD-10-CM

## 2014-01-28 DIAGNOSIS — I201 Angina pectoris with documented spasm: Secondary | ICD-10-CM

## 2014-01-28 DIAGNOSIS — I739 Peripheral vascular disease, unspecified: Secondary | ICD-10-CM

## 2014-01-28 DIAGNOSIS — M79609 Pain in unspecified limb: Secondary | ICD-10-CM

## 2014-01-28 MED ORDER — METOPROLOL TARTRATE 50 MG PO TABS: 25.0000 mg | ORAL_TABLET | Freq: Two times a day (BID) | ORAL | Status: DC

## 2014-01-28 MED ORDER — AMLODIPINE BESYLATE 5 MG PO TABS: ORAL_TABLET | ORAL | Status: DC

## 2014-01-28 MED ORDER — ASPIRIN EC 81 MG PO TBEC: 81.0000 mg | DELAYED_RELEASE_TABLET | Freq: Every day | ORAL | Status: DC

## 2014-01-28 NOTE — Progress Notes (Signed)
Cardiology Office Note    Date:  01/28/2014   ID:  Jeff [name], DOB [birth year], MRN [id number]  PCP:  Lillard Anes, MD  Cardiologist:  Dr. Darlin Coco     History of Present Illness: Jeff [name] is a 48 y.o. male with a hx of NSTEMI in 05/2012 2/2 probable coronary vasospasm, HTN, HL, post RAI hypothyroidism (Grave's Disease).  Last seen 09/2012.  He returns for FU.  He is doing well.  He has occasional L sided chest pain that is atypical.  He denies exertional chest pain.  He has had a couple of back surgeries in the last year.  Activity is somewhat limited.  He notes dyspnea with more extreme activities.  He is NYHA 2-2b.  He denies orthopnea, PND, edema, syncope.    Studies:  - LHC (1/14):  No CAD, EF 55-65%   Recent Labs/Images: 06/24/2013: TSH 5.389*  07/16/2013: Creatinine 1.18; Hemoglobin 13.5; Potassium 5.6*    Dg Chest 2 View    07/09/2013    IMPRESSION: There is no evidence of active cardiopulmonary disease.    Electronically Signed   By: David  Martinique   On: 07/09/2013 08:54     Wt Readings from Last 3 Encounters:  07/09/13 252 lb (114.306 kg)  06/24/13 252 lb (114.306 kg)  12/07/12 244 lb 6.4 oz (110.859 kg)     Past Medical History  Diagnosis Date  . HYPERTENSION 03/16/2010  . HYPERTHYROIDISM 03/16/2010    s/p RAI  . THYROID STORM 03/16/2010  . Altered mental status 03/16/2010  . Hypothyroidism following radioiodine therapy   . NSTEMI (non-ST elevated myocardial infarction)     05/2012:  LHC with normal EF and no CAD (? spasm)  . GERD (gastroesophageal reflux disease)   . Arthritis     Current Outpatient Prescriptions  Medication Sig Dispense Refill  . amLODipine (NORVASC) 5 MG tablet TAKE 1 TABLET (5 MG TOTAL) BY MOUTH DAILY.  30 tablet  0  . atorvastatin (LIPITOR) 40 MG tablet Take 40 mg by mouth at bedtime.       . clonazePAM (KLONOPIN) 1 MG tablet Take 1 mg by mouth 2 (two) times daily as needed for anxiety.      . diazepam (VALIUM) 5  MG tablet Take 5 mg by mouth every 6 (six) hours as needed for anxiety.      . diazepam (VALIUM) 5 MG tablet Take 1 tablet (5 mg total) by mouth every 6 (six) hours as needed for muscle spasms.  50 tablet  1  . docusate sodium 100 MG CAPS Take 100 mg by mouth 2 (two) times daily.  60 capsule  0  . docusate sodium 100 MG CAPS Take 100 mg by mouth 2 (two) times daily.  60 capsule  0  . levothyroxine (SYNTHROID, LEVOTHROID) 100 MCG tablet Take 1 tablet (100 mcg total) by mouth daily before breakfast.  30 tablet  11  . Magnesium 250 MG TABS Take 250 mg by mouth daily.      . metoprolol (LOPRESSOR) 50 MG tablet Take 0.5 tablets (25 mg total) by mouth 2 (two) times daily.      . nitroGLYCERIN (NITROSTAT) 0.4 MG SL tablet Place 1 tablet (0.4 mg total) under the tongue every 5 (five) minutes as needed for chest pain.  25 tablet  3  . omega-3 acid ethyl esters (LOVAZA) 1 G capsule Take 1 g by mouth daily.      Marland Kitchen omeprazole (PRILOSEC) 40 MG capsule Take  40 mg by mouth daily.      Marland Kitchen oxyCODONE (ROXICODONE) 15 MG immediate release tablet Take 15 mg by mouth every 4 (four) hours as needed for pain.      Marland Kitchen oxyCODONE (ROXICODONE) 15 MG immediate release tablet Take 1 tablet (15 mg total) by mouth every 4 (four) hours as needed for moderate pain.  100 tablet  0  . POTASSIUM PO Take 1 tablet by mouth daily.      Marland Kitchen triamcinolone cream (KENALOG) 0.1 % Apply 1 application topically 2 (two) times daily as needed (for feet).       No current facility-administered medications for this visit.     Allergies:   Review of patient's allergies indicates no known allergies.   Social History:  The patient  reports that he has never smoked. He does not have any smokeless tobacco history on file. He reports that he does not drink alcohol or use illicit drugs.   Family History:  The patient's family history includes Arthritis in his other; Diabetes in his other; Heart disease in his other; Hypertension in his other; Thyroid  disease in his mother.   ROS:  Please see the history of present illness.  He notes R calf pain with exertion that was attributed to his back issues.  This has not improved since his most recent surgery.    All other systems reviewed and negative.   PHYSICAL EXAM: VS:  BP 130/90  Pulse 70  Ht 5\' 10"  (1.778 m)  Wt 249 lb (112.946 kg)  BMI 35.73 kg/m2 Well nourished, well developed, in no acute distress HEENT: normal Neck: no JVD Cardiac:  normal S1, S2; RRR; no murmur Lungs:  clear to auscultation bilaterally, no wheezing, rhonchi or rales Abd: soft, nontender, no hepatomegaly Ext: no edema Skin: warm and dry Vascular:  DP/PT diff to palpate bilaterally Neuro:  CNs 2-12 intact, no focal abnormalities noted  EKG:  NSR, HR 70, normal axis, NSSTTW changes     ASSESSMENT AND PLAN:  1. Coronary vasospasm:  Overall stable.  Continue amlodipine, ASA, statin. 2. Essential hypertension, benign:  Borderline elevated.  BP at home optimal.  Continue to monitor.   3. HLD (hyperlipidemia):  Labs check by PCP.  Will request most recent Lipid panel for his chart.  Continue statin.  4. Calf pain, left:  May be related to radicular symptoms from his back.  However, there has not been improvement since his surgery.  It is difficult to palpate his pulses.  I will check ABIs to r/o vascular etiology.   Disposition:  FU with Dr. Darlin Coco 1 year.   Signed, Versie Starks, MHS 01/28/2014 8:28 AM    [facility] [address], [place], [place]  [address] Phone: [phone]; Fax: [phone]

## 2014-01-28 NOTE — Patient Instructions (Addendum)
REFILLS WERE SENT IN FOR AMLODIPINE AND METOPROLOL  Your physician has requested that you have a lower extremity arterial duplex THIS IS TO BE DONE WITH ABI'S; DX LEFT CALF PAIN. This test is an ultrasound of the arteries in the legs or arms. It looks at arterial blood flow in the legs and arms. Allow one hour for Lower and Upper Arterial scans. There are no restrictions or special instructions  Your physician wants you to follow-up in: [place]. You will receive a reminder letter in the mail two months in advance. If you don't receive a letter, please call our office to schedule the follow-up appointment.   YOU WILL NEED TO FOLLOW UP WITH EITHER DR. Gwenlyn Found OR DR. Fletcher Anon PER SCOTT WEAVER, PAC DUE TO YOUR RESULTS TODAY FROM ULTRA SOUND

## 2014-01-28 NOTE — Progress Notes (Signed)
[facility] Doppler + Duplex

## 2014-01-29 ENCOUNTER — Encounter: Payer: Self-pay | Admitting: Physician Assistant

## 2014-01-30 ENCOUNTER — Telehealth: Payer: Self-pay | Admitting: Physician Assistant

## 2014-01-30 NOTE — Telephone Encounter (Signed)
New message  ° ° °Patient calling back for test results.  °

## 2014-01-30 NOTE — Telephone Encounter (Signed)
pt's wife notified about [facility] w/ABI results. Pt has been scheduled as new pt for Dr. Fletcher Anon due to results from [facility]. Wife verbalized understanding to Plan of Care and verified appt with Dr. Fletcher Anon.

## 2014-02-10 ENCOUNTER — Other Ambulatory Visit: Payer: Self-pay

## 2014-02-10 MED ORDER — LEVOTHYROXINE SODIUM 100 MCG PO TABS: 100.0000 ug | ORAL_TABLET | Freq: Every day | ORAL | Status: DC

## 2014-02-11 ENCOUNTER — Ambulatory Visit (INDEPENDENT_AMBULATORY_CARE_PROVIDER_SITE_OTHER): Payer: Medicaid Other | Admitting: Cardiovascular Disease

## 2014-02-11 ENCOUNTER — Encounter: Payer: Self-pay | Admitting: Cardiovascular Disease

## 2014-02-11 VITALS — BP 116/82 | HR 76 | Ht 70.0 in | Wt 250.4 lb

## 2014-02-11 DIAGNOSIS — I739 Peripheral vascular disease, unspecified: Secondary | ICD-10-CM

## 2014-02-11 DIAGNOSIS — E785 Hyperlipidemia, unspecified: Secondary | ICD-10-CM

## 2014-02-11 DIAGNOSIS — I10 Essential (primary) hypertension: Secondary | ICD-10-CM

## 2014-02-11 LAB — BASIC METABOLIC PANEL
BUN: 7 mg/dL (ref 6–23)
CO2: 26 mEq/L (ref 19–32)
Calcium: 8.8 mg/dL (ref 8.4–10.5)
Chloride: 106 mEq/L (ref 96–112)
Creatinine, Ser: 1.2 mg/dL (ref 0.4–1.5)
GFR: 87.6 mL/min (ref >60.00)
Glucose, Bld: 86 mg/dL (ref 70–99)
Potassium: 3.9 mEq/L (ref 3.5–5.1)
Sodium: 138 mEq/L (ref 135–145)

## 2014-02-11 LAB — CBC
HCT: 44.7 % (ref 39.0–52.0)
Hemoglobin: 14.8 g/dL (ref 13.0–17.0)
MCHC: 33 g/dL (ref 30.0–36.0)
MCV: 84.7 fl (ref 78.0–100.0)
Platelets: 169 10*3/uL (ref 150.0–400.0)
RBC: 5.28 Mil/uL (ref 4.22–5.81)
RDW: 14.9 % (ref 11.5–15.5)
WBC: 8.7 10*3/uL (ref 4.0–10.5)

## 2014-02-11 LAB — PROTIME-INR
INR: 1 ratio (ref 0.8–1.0)
Prothrombin Time: 11.5 s (ref 9.6–13.1)

## 2014-02-11 NOTE — Patient Instructions (Signed)
Your physician has requested that you have a peripheral vascular angiogram. This exam is performed at the hospital. During this exam IV contrast is used to look at arterial blood flow. Please review the information sheet given for details.  Your physician recommends that you have lab work today: BMP, CBC and PT/INR  Your physician recommends that you continue on your current medications as directed. Please refer to the Current Medication list given to you today.  

## 2014-02-11 NOTE — Assessment & Plan Note (Signed)
Lab Results  Component Value Date   CHOL 107 06/12/2012   HDL 43 06/12/2012   LDLCALC 45 06/12/2012   LDLDIRECT 43 06/11/2012   TRIG 93 06/12/2012   CHOLHDL 2.5 06/12/2012   Continue atorvastatin.
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
## 2014-02-11 NOTE — Assessment & Plan Note (Signed)
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 Noninvsive evaluation showed mildly reduced ABI with evidence of short occlusion distally in the left SFA.  I discussed different management options including medical therapy with exercise program vs. angiography and possible endovascular intervention.  He has been very frustrated with his symptoms and has not been able to advance his physical activities because of this.  Thus, will proceed with the second option. I discussed risks , benefits and expectations.

## 2014-02-11 NOTE — Assessment & Plan Note (Signed)
BP is well controlled 

## 2014-02-12 ENCOUNTER — Other Ambulatory Visit: Payer: Self-pay

## 2014-02-12 MED ORDER — LEVOTHYROXINE SODIUM 100 MCG PO TABS: 100.0000 ug | ORAL_TABLET | Freq: Every day | ORAL | Status: DC

## 2014-02-18 ENCOUNTER — Encounter (HOSPITAL_COMMUNITY): Payer: Self-pay | Admitting: Pharmacy Technician

## 2014-02-19 ENCOUNTER — Ambulatory Visit (HOSPITAL_COMMUNITY)
Admission: RE | Admit: 2014-02-19 | Discharge: 2014-02-19 | Disposition: A | Discharge status: Home / Self Care | Payer: Medicaid Other | Source: Ambulatory Visit | Attending: Cardiovascular Disease | Admitting: Cardiovascular Disease

## 2014-02-19 ENCOUNTER — Encounter (HOSPITAL_COMMUNITY)
Admission: RE | Disposition: A | Discharge status: Home / Self Care | Payer: Self-pay | Source: Ambulatory Visit | Attending: Cardiovascular Disease

## 2014-02-19 ENCOUNTER — Other Ambulatory Visit: Payer: Self-pay | Admitting: Cardiovascular Disease

## 2014-02-19 DIAGNOSIS — Z7982 Long term (current) use of aspirin: Secondary | ICD-10-CM | POA: Insufficient documentation

## 2014-02-19 DIAGNOSIS — I70219 Atherosclerosis of native arteries of extremities with intermittent claudication, unspecified extremity: Secondary | ICD-10-CM

## 2014-02-19 DIAGNOSIS — E785 Hyperlipidemia, unspecified: Secondary | ICD-10-CM | POA: Diagnosis not present

## 2014-02-19 DIAGNOSIS — E0500 Thyrotoxicosis with diffuse goiter without thyrotoxic crisis or storm: Secondary | ICD-10-CM | POA: Diagnosis not present

## 2014-02-19 DIAGNOSIS — I252 Old myocardial infarction: Secondary | ICD-10-CM | POA: Diagnosis not present

## 2014-02-19 DIAGNOSIS — I739 Peripheral vascular disease, unspecified: Secondary | ICD-10-CM | POA: Insufficient documentation

## 2014-02-19 DIAGNOSIS — I10 Essential (primary) hypertension: Secondary | ICD-10-CM | POA: Diagnosis not present

## 2014-02-19 HISTORY — PX: ABDOMINAL AORTAGRAM: SHX5454

## 2014-02-19 SURGERY — ABDOMINAL AORTAGRAM
Anesthesia: LOCAL

## 2014-02-19 MED ORDER — SODIUM CHLORIDE 0.9 % IJ SOLN: 3.0000 mL | Freq: Two times a day (BID) | INTRAMUSCULAR | Status: DC

## 2014-02-19 MED ORDER — ASPIRIN 81 MG PO CHEW: 81.0000 mg | CHEWABLE_TABLET | ORAL | Status: DC

## 2014-02-19 MED ORDER — LIDOCAINE HCL (PF) 1 % IJ SOLN
INTRAMUSCULAR | Status: AC
  Filled 2014-02-19: qty 30

## 2014-02-19 MED ORDER — SODIUM CHLORIDE 0.9 % IV SOLN: INTRAVENOUS | Status: AC

## 2014-02-19 MED ORDER — SODIUM CHLORIDE 0.9 % IV SOLN: 250.0000 mL | INTRAVENOUS | Status: DC | PRN

## 2014-02-19 MED ORDER — FENTANYL CITRATE 0.05 MG/ML IJ SOLN
INTRAMUSCULAR | Status: AC
  Filled 2014-02-19: qty 2

## 2014-02-19 MED ORDER — CILOSTAZOL 50 MG PO TABS: 50.0000 mg | ORAL_TABLET | Freq: Two times a day (BID) | ORAL | Status: DC

## 2014-02-19 MED ORDER — SODIUM CHLORIDE 0.9 % IJ SOLN: 3.0000 mL | INTRAMUSCULAR | Status: DC | PRN

## 2014-02-19 MED ORDER — HEPARIN (PORCINE) IN NACL 2-0.9 UNIT/ML-% IJ SOLN
INTRAMUSCULAR | Status: AC
  Filled 2014-02-19: qty 1000

## 2014-02-19 MED ORDER — MIDAZOLAM HCL 2 MG/2ML IJ SOLN
INTRAMUSCULAR | Status: AC
  Filled 2014-02-19: qty 2

## 2014-02-19 MED ORDER — SODIUM CHLORIDE 0.9 % IV SOLN
INTRAVENOUS | Status: DC
  Administered 2014-02-19: 08:00:00 via INTRAVENOUS

## 2014-02-19 NOTE — Progress Notes (Signed)
Pt continues to wait for ride, sitting in room no signs of distress, watching tv. Ride will arrive approx 1930.

## 2014-02-19 NOTE — Progress Notes (Signed)
Pt resting quietly, no acute distress. No discomfort. Hob flat, sheath in place, patent. Removed at 072 with no complication, pressure held for 20 min, no discomfort. Gauze placed to site at 945, covered with opsite. No obvious hematoma, no bleeding. Pedal pulse +2. Denies need to void. Advised pt to keep rle immobile and to remain flat in bed until instructed otherwise.

## 2014-02-19 NOTE — Discharge Instructions (Signed)
Start taking Cilastazol (Pletal) 50 mg twice daily . A prescription was sent to [facility].   Angiogram, Care After  Refer to this sheet in the next few weeks. These instructions provide you with information on caring for yourself after your procedure. Your health care provider may also give you more specific instructions. Your treatment has been planned according to current medical practices, but problems sometimes occur. Call your health care provider if you have any problems or questions after your procedure.  WHAT TO EXPECT AFTER THE PROCEDURE After your procedure, it is typical to have the following sensations:  Minor discomfort or tenderness and a small bump at the catheter insertion site. The bump should usually decrease in size and tenderness within 1 to 2 weeks.  Any bruising will usually fade within 2 to 4 weeks. HOME CARE INSTRUCTIONS   You may need to keep taking blood thinners if they were prescribed for you. Take medicines only as directed by your health care provider.  Do not apply powder or lotion to the site.  Do not take baths, swim, or use a hot tub until your health care provider approves.  You may shower 24 hours after the procedure. Remove the bandage (dressing) and gently wash the site with plain soap and water. Gently pat the site dry.  Inspect the site at least twice daily.  Limit your activity for the first 24 hours. Do not bend, squat, or lift anything over 10 lb (9 kg) or as directed by your health care provider.  Plan to have someone take you home after the procedure. Follow instructions about when you can drive or return to work. SEEK MEDICAL CARE IF:  You get light-headed when standing up.  You have drainage (other than a small amount of blood on the dressing).  You have chills.  You have a fever.  You have redness, warmth, swelling, or pain at the insertion site. SEEK IMMEDIATE MEDICAL CARE IF:   You develop chest pain or shortness of breath,  feel faint, or pass out.  You have bleeding, swelling larger than a walnut, or drainage from the catheter insertion site.  You develop pain, discoloration, coldness, or severe bruising in the leg or arm that held the catheter.  You have heavy bleeding from the site. If this happens, hold pressure on the site. MAKE SURE YOU:  Understand these instructions.  Will watch your condition.  Will get help right away if you are not doing well or get worse. Document Released: 11/18/2004 Document Revised: 09/16/2013 Document Reviewed: 09/24/2012 [facility] Patient Information 2015 [place], [place]. This information is not intended to replace advice given to you by your health care provider. Make sure you discuss any questions you have with your health care provider.

## 2014-02-19 NOTE — Interval H&P Note (Signed)
History and Physical Interval Note:  02/19/2014 8:09 AM  Jeff [name]  has presented today for surgery, with the diagnosis of pad  The various methods of treatment have been discussed with the patient and family. After consideration of risks, benefits and other options for treatment, the patient has consented to  Procedure(s): ABDOMINAL AORTAGRAM (N/A) as a surgical intervention .  The patient's history has been reviewed, patient examined, no change in status, stable for surgery.  I have reviewed the patient's chart and labs.  Questions were answered to the patient's satisfaction.     Kathlyn Sacramento
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
## 2014-02-19 NOTE — CV Procedure (Signed)
PERIPHERAL VASCULAR PROCEDURE  NAME:  Jeff [name]   MRN: [id number] DOB:  [birth year]   ADMIT DATE: 02/19/2014  Performing Cardiologist: Kathlyn Sacramento Primary Physician: Lillard Anes, MD   Procedures Performed:  Abdominal Aortic Angiogram with Bi-Iliofemoral Runoff  Second Order right and left  Lower Extremity Angiography with Runoff   Indication(s):   Claudication    Consent: The procedure with Risks/Benefits/Alternatives and Indications was reviewed with the patient .  All questions were answered.  Medications:  Sedation:  3 mg IV Versed, 50 mcg IV Fentanyl  Contrast:  109 ml  Visipaque   Procedural details: The right groin was prepped, draped, and anesthetized with 1% lidocaine. Using modified Seldinger technique, a 5 French sheath was introduced into the right common femoral artery. A 5 Fr Short Pigtail Catheter was advanced of over a  Versicore wire into the descending Aorta to a level just above the renal arteries. A power injection of 51ml/sec contrast over 1 sec was performed for Abdominal Aortic Angiography.  The catheter was then pulled back to a level just above the Aortic bifurcation, and a second power injection was performed to evaluate the iliac arteries.   The catheter was then removed. At this time the injector was directed to the sheath SideArm and ipsilateral artery angiography was performed via power injection of 5  ml/sec contrast for a total of 35 ml.     A crossover catheter was advanced over a Glide wire which was then pulled back the aortic bifurcation and the wire was advanced down the contralateral SFA. the catheter was exchanged into an end hole straight tip catheter which was advanced over the wire to the common femoral artery. Contralateral second-order lower extremity angiography was performed via power injection of 5 ml / sec contrast for a total of 35 ml. the iliac bifurcation had a very high and steep angulation. There was  significant difficulty in advancing the catheters. A 4 French straight tip catheter was used .   The patient tolerated the procedure well with no immediate complications.    Hemodynamics:  Central Aortic Pressure / Mean Aortic Pressure: 129/77  Findings:  Abdominal aorta: Normal with no evidence of aneurysm.  Left renal artery: Normal  Right renal artery: Not well visualized as the catheter appears to be below the right renal artery.  Celiac artery: Not visualized  Superior mesenteric artery: Not visualized  Right common iliac artery: 20% distal stenosis  Right internal iliac artery: Normal  Right external iliac artery: Normal  Right common femoral artery: Normal  Right profunda femoral artery: Normal  Right superficial femoral artery: Normal  Right popliteal artery: Normal  Three-vessel runoff below the knee.  Left common iliac artery:  Normal  Left internal iliac artery: Normal  Left external iliac artery: Normal  Left common femoral artery: Normal  Left profunda femoral artery: Normal  Left superficial femoral artery:  Minor irregularities in the distal segment  Left popliteal artery: Short occlusion behind the knee with reconstitution in the anterior tibial via extensive collaterals.  Three-vessel runoff below the knee.   Conclusions: 1. No significant aortoiliac disease. No significant disease affecting the right lower extremity. 2. 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  Recommendations:  I recommend an attempt at medical therapy before considering revascularization considering the location of occlusion. I started the patient on small dose cilostazol. Continue a walking program and reevaluate symptoms in few months.   Kathlyn Sacramento,  MD, [facility] 02/19/2014 9:11 AM

## 2014-02-28 ENCOUNTER — Other Ambulatory Visit: Payer: Self-pay

## 2014-03-11 ENCOUNTER — Ambulatory Visit (INDEPENDENT_AMBULATORY_CARE_PROVIDER_SITE_OTHER): Payer: Medicaid Other | Admitting: Cardiovascular Disease

## 2014-03-11 ENCOUNTER — Encounter: Payer: Self-pay | Admitting: Cardiovascular Disease

## 2014-03-11 VITALS — BP 110/76 | HR 85 | Ht 70.0 in | Wt 249.0 lb

## 2014-03-11 DIAGNOSIS — I10 Essential (primary) hypertension: Secondary | ICD-10-CM

## 2014-03-11 DIAGNOSIS — I739 Peripheral vascular disease, unspecified: Secondary | ICD-10-CM

## 2014-03-11 DIAGNOSIS — E785 Hyperlipidemia, unspecified: Secondary | ICD-10-CM

## 2014-03-11 DIAGNOSIS — Z23 Encounter for immunization: Secondary | ICD-10-CM

## 2014-03-11 MED ORDER — CILOSTAZOL 100 MG PO TABS: 100.0000 mg | ORAL_TABLET | Freq: Two times a day (BID) | ORAL | Status: DC

## 2014-03-11 NOTE — Patient Instructions (Signed)
Your physician has recommended you make the following change in your medication:  1. INCREASE Pletal to 100mg  take one by mouth twice a day  Your physician recommends that you schedule a follow-up appointment in: 2 MONTHS with Dr Fletcher Anon
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
## 2014-03-11 NOTE — Assessment & Plan Note (Signed)
BP is well controlled 

## 2014-03-11 NOTE — Assessment & Plan Note (Signed)
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 Noninvsive evaluation showed mildly reduced ABI . Angiography showed short occlusion of the popliteal artery with good collaterals.  I increased Pletal today and asked him to start a walking program.  He is otherwise on good medical therapy.  Follow up in 2 months. If no improvement, then I will proceed with attempted revascularization via antegrade approach.

## 2014-03-11 NOTE — Assessment & Plan Note (Signed)
Lab Results  Component Value Date   CHOL 107 06/12/2012   HDL 43 06/12/2012   LDLCALC 45 06/12/2012   LDLDIRECT 43 06/11/2012   TRIG 93 06/12/2012   CHOLHDL 2.5 06/12/2012   Continue atorvastatin.

## 2014-03-14 ENCOUNTER — Other Ambulatory Visit: Payer: Self-pay | Admitting: Cardiology

## 2014-04-24 ENCOUNTER — Encounter (HOSPITAL_COMMUNITY): Payer: Self-pay | Admitting: Cardiovascular Disease

## 2014-05-20 ENCOUNTER — Encounter: Payer: Self-pay | Admitting: Cardiovascular Disease

## 2014-05-20 ENCOUNTER — Ambulatory Visit (INDEPENDENT_AMBULATORY_CARE_PROVIDER_SITE_OTHER): Payer: Medicaid Other | Admitting: Cardiovascular Disease

## 2014-05-20 VITALS — BP 115/82 | HR 73 | Ht 70.0 in | Wt 243.8 lb

## 2014-05-20 DIAGNOSIS — I10 Essential (primary) hypertension: Secondary | ICD-10-CM

## 2014-05-20 DIAGNOSIS — E785 Hyperlipidemia, unspecified: Secondary | ICD-10-CM

## 2014-05-20 DIAGNOSIS — I739 Peripheral vascular disease, unspecified: Secondary | ICD-10-CM

## 2014-05-20 LAB — CBC
HCT: 47.2 % (ref 39.0–52.0)
Hemoglobin: 15.4 g/dL (ref 13.0–17.0)
MCHC: 32.5 g/dL (ref 30.0–36.0)
MCV: 85.2 fl (ref 78.0–100.0)
Platelets: 174 10*3/uL (ref 150.0–400.0)
RBC: 5.54 Mil/uL (ref 4.22–5.81)
RDW: 14.3 % (ref 11.5–15.5)
WBC: 8.1 10*3/uL (ref 4.0–10.5)

## 2014-05-20 LAB — BASIC METABOLIC PANEL
BUN: 7 mg/dL (ref 6–23)
CO2: 28 mEq/L (ref 19–32)
Calcium: 8.9 mg/dL (ref 8.4–10.5)
Chloride: 103 mEq/L (ref 96–112)
Creatinine, Ser: 1.1 mg/dL (ref 0.4–1.5)
GFR: 90.21 mL/min (ref >60.00)
Glucose, Bld: 96 mg/dL (ref 70–99)
Potassium: 4 mEq/L (ref 3.5–5.1)
Sodium: 138 mEq/L (ref 135–145)

## 2014-05-20 LAB — PROTIME-INR
INR: 1 ratio (ref 0.8–1.0)
Prothrombin Time: 11.3 s (ref 9.6–13.1)
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
## 2014-05-20 NOTE — Assessment & Plan Note (Signed)
Lab Results  Component Value Date   CHOL 107 06/12/2012   HDL 43 06/12/2012   LDLCALC 45 06/12/2012   LDLDIRECT 43 06/11/2012   TRIG 93 06/12/2012   CHOLHDL 2.5 06/12/2012   Continue atorvastatin.

## 2014-05-20 NOTE — Assessment & Plan Note (Signed)
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 Noninvsive evaluation showed mildly reduced ABI . Angiography showed short occlusion of the popliteal artery with good collaterals.  No improvement in symptoms with Pletal and a walking program.  He is otherwise on good medical therapy.  I discussed the risks and benefits of revascularization and he wants to proceed. I discussed long term patency.   Will attempt revascularization via antegrade approach due to steep iliac angulation.

## 2014-05-20 NOTE — Patient Instructions (Signed)
Your physician has requested that you have a peripheral vascular angiogram. This exam is performed at the hospital. During this exam IV contrast is used to look at arterial blood flow. Please review the information sheet given for details.  Your physician recommends that you have lab work today: BMP, CBC and PT/INR  Your physician recommends that you continue on your current medications as directed. Please refer to the Current Medication list given to you today.  

## 2014-05-20 NOTE — Assessment & Plan Note (Signed)
BP is well controlled 

## 2014-05-26 ENCOUNTER — Encounter: Payer: Self-pay | Admitting: Endocrinology

## 2014-05-26 ENCOUNTER — Ambulatory Visit (INDEPENDENT_AMBULATORY_CARE_PROVIDER_SITE_OTHER): Payer: Medicaid Other | Admitting: Endocrinology

## 2014-05-26 VITALS — BP 134/90 | HR 76 | Temp 97.5°F | Ht 70.0 in | Wt 248.0 lb

## 2014-05-26 DIAGNOSIS — E890 Postprocedural hypothyroidism: Secondary | ICD-10-CM

## 2014-05-26 LAB — TSH: TSH: 2.25 u[IU]/mL (ref 0.35–4.50)

## 2014-05-26 NOTE — Patient Instructions (Addendum)
blood tests are being requested for you today.  We'll contact you with results.   Please return in 1 year.   

## 2014-05-26 NOTE — Progress Notes (Signed)
Subjective:    Patient ID: Jeff [name], male    DOB: 06-13-1965, 49 y.o.   MRN: [id number]  HPI In early 2012, pt had i-131 rx for hyperthyroidism, due to grave's dz vs multinodular goiter vs both. He is now on synthroid 100 mcg/day.  pt states he feels well in general.   Past Medical History  Diagnosis Date  . HYPERTENSION 03/16/2010  . HYPERTHYROIDISM 03/16/2010    s/p RAI  . THYROID STORM 03/16/2010  . Altered mental status 03/16/2010  . Hypothyroidism following radioiodine therapy   . NSTEMI (non-ST elevated myocardial infarction)     05/2012:  LHC with normal EF and no CAD (? spasm)  . GERD (gastroesophageal reflux disease)   . Arthritis   . PAD (peripheral artery disease)     a.  ABI (01/2014):  L 0.53; R 1.0 >> referred to [facility]    Past Surgical History  Procedure Laterality Date  . Back surgery  03/2009, 10/2009  . Cervical disc surgery  10  . Shoulder arthroscopy w/ rotator cuff repair Right 1017,5102    athroscopy debridement, 2nd rotator cuff  . Hernia repair      umbilical kid  . Posterior cervical fusion/foraminotomy N/A 12/10/2012    Procedure: POSTERIOR CERVICAL FUSION/FORAMINOTOMY LEVEL 1;  Surgeon: Ophelia Charter, MD;  Location: [place] [facility];  Service: Neurosurgery;  Laterality: N/A;  Cervical three-four posterior cervical fusion with lateral mass screws  . Left heart catheterization with coronary angiogram N/A 06/12/2012    Procedure: LEFT HEART CATHETERIZATION WITH CORONARY ANGIOGRAM;  Surgeon: Josue Hector, MD;  Location: [facility] CATH LAB;  Service: Cardiovascular;  Laterality: N/A;  . Abdominal aortagram N/A 02/19/2014    Procedure: ABDOMINAL Maxcine Ham;  Surgeon: Wellington Hampshire, MD;  Location: [place] CATH LAB;  Service: Cardiovascular;  Laterality: N/A;    History   Social History  . Marital Status: Married    Spouse Name: N/A    Number of Children: N/A  . Years of Education: N/A   Occupational History  . Disabled    Social History Main Topics  .  Smoking status: Never Smoker   . Smokeless tobacco: Not on file     Comment: no alcohol now  . Alcohol Use: No  . Drug Use: No  . Sexual Activity: Not on file   Other Topics Concern  . Not on file   Social History Narrative   Regular exercise-no   Disabled to due back problem    Current Outpatient Prescriptions on File Prior to Visit  Medication Sig Dispense Refill  . amLODipine (NORVASC) 5 MG tablet TAKE 1 TABLET (5 MG TOTAL) BY MOUTH DAILY. 30 tablet 11  . aspirin EC 81 MG tablet Take 1 tablet (81 mg total) by mouth daily.    Marland Kitchen atorvastatin (LIPITOR) 40 MG tablet Take 40 mg by mouth at bedtime.     . cilostazol (PLETAL) 100 MG tablet Take 1 tablet (100 mg total) by mouth 2 (two) times daily. 60 tablet 6  . clonazePAM (KLONOPIN) 2 MG tablet Take 2 mg by mouth at bedtime.    Marland Kitchen escitalopram (LEXAPRO) 20 MG tablet Take 20 mg by mouth daily.   5  . levothyroxine (SYNTHROID, LEVOTHROID) 100 MCG tablet Take 1 tablet (100 mcg total) by mouth daily before breakfast. 90 tablet 4  . Magnesium 250 MG TABS Take 250 mg by mouth daily.    . metoprolol (LOPRESSOR) 50 MG tablet Take 0.5 tablets (25 mg total) by  mouth 2 (two) times daily. 180 tablet 3  . nitroGLYCERIN (NITROSTAT) 0.4 MG SL tablet Place 1 tablet (0.4 mg total) under the tongue every 5 (five) minutes as needed for chest pain. 25 tablet 3  . omega-3 acid ethyl esters (LOVAZA) 1 G capsule Take 1 g by mouth daily.    Marland Kitchen omeprazole (PRILOSEC) 40 MG capsule Take 40 mg by mouth daily.    Marland Kitchen oxyCODONE-acetaminophen (PERCOCET) 10-325 MG per tablet Take 1 tablet by mouth every 8 (eight) hours as needed for pain (for back pain).   0  . POTASSIUM PO Take 1 tablet by mouth daily. Pt doesn't know how may mgs he is taking.     No current facility-administered medications on file prior to visit.    Allergies  Allergen Reactions  . Vicodin [Hydrocodone-Acetaminophen] Hives    Family History  Problem Relation Age of Onset  . Thyroid disease  Mother     hypothyroidism  . Arthritis Other   . Hypertension Other   . Heart disease Other     Parents, Grandparents  . Diabetes Other     Grandparents  . Heart attack Maternal Grandfather     BP 134/90 mmHg  Pulse 76  Temp(Src) 97.5 F (36.4 C) (Oral)  Ht 5\' 10"  (1.778 m)  Wt 248 lb (112.492 kg)  BMI 35.58 kg/m2  SpO2 97%  Review of Systems Denies weight change    Objective:   Physical Exam VITAL SIGNS:  See vs page GENERAL: no distress Neck: a healed scar is present (c-spine surgery).  i do not appreciate a nodule in the thyroid or elsewhere in the neck.     Lab Results  Component Value Date   TSH 2.25 05/26/2014      Assessment & Plan:  Post-I-131 hypothyroidism, well-replaced.  Patient is advised the following: Patient Instructions  blood tests are being requested for you today.  We'll contact you with results.   Please return in 1 year.

## 2014-05-28 ENCOUNTER — Encounter (HOSPITAL_COMMUNITY): Payer: Self-pay | Admitting: *Deleted

## 2014-05-28 ENCOUNTER — Observation Stay (HOSPITAL_COMMUNITY)
Admission: RE | Admit: 2014-05-28 | Discharge: 2014-05-29 | Disposition: A | Discharge status: Home / Self Care | Payer: Medicaid Other | Source: Ambulatory Visit | Attending: Cardiovascular Disease | Admitting: Cardiovascular Disease

## 2014-05-28 ENCOUNTER — Observation Stay (HOSPITAL_COMMUNITY): Payer: Medicaid Other

## 2014-05-28 ENCOUNTER — Encounter (HOSPITAL_COMMUNITY)
Admission: RE | Disposition: A | Discharge status: Home / Self Care | Payer: Self-pay | Source: Ambulatory Visit | Attending: Cardiovascular Disease

## 2014-05-28 DIAGNOSIS — I252 Old myocardial infarction: Secondary | ICD-10-CM | POA: Insufficient documentation

## 2014-05-28 DIAGNOSIS — I10 Essential (primary) hypertension: Secondary | ICD-10-CM | POA: Diagnosis not present

## 2014-05-28 DIAGNOSIS — I70212 Atherosclerosis of native arteries of extremities with intermittent claudication, left leg: Principal | ICD-10-CM | POA: Insufficient documentation

## 2014-05-28 DIAGNOSIS — E890 Postprocedural hypothyroidism: Secondary | ICD-10-CM | POA: Insufficient documentation

## 2014-05-28 DIAGNOSIS — E785 Hyperlipidemia, unspecified: Secondary | ICD-10-CM | POA: Insufficient documentation

## 2014-05-28 DIAGNOSIS — I70219 Atherosclerosis of native arteries of extremities with intermittent claudication, unspecified extremity: Secondary | ICD-10-CM | POA: Diagnosis present

## 2014-05-28 DIAGNOSIS — R109 Unspecified abdominal pain: Secondary | ICD-10-CM

## 2014-05-28 DIAGNOSIS — E0500 Thyrotoxicosis with diffuse goiter without thyrotoxic crisis or storm: Secondary | ICD-10-CM | POA: Insufficient documentation

## 2014-05-28 DIAGNOSIS — I739 Peripheral vascular disease, unspecified: Secondary | ICD-10-CM

## 2014-05-28 HISTORY — PX: LOWER EXTREMITY ANGIOGRAM: SHX5508

## 2014-05-28 HISTORY — DX: Other chronic pain: G89.29

## 2014-05-28 HISTORY — DX: Dorsalgia, unspecified: M54.9

## 2014-05-28 LAB — BASIC METABOLIC PANEL
Anion gap: 7 (ref 5–15)
BUN: 7 mg/dL (ref 6–23)
CO2: 24 mmol/L (ref 19–32)
Calcium: 8.3 mg/dL — ABNORMAL LOW (ref 8.4–10.5)
Chloride: 108 mEq/L (ref 96–112)
Creatinine, Ser: 1.03 mg/dL (ref 0.50–1.35)
GFR calc Af Amer: >90 mL/min (ref >90)
GFR calc non Af Amer: 85 mL/min — ABNORMAL LOW (ref >90)
Glucose, Bld: 99 mg/dL (ref 70–99)
Potassium: 4.9 mmol/L (ref 3.5–5.1)
Sodium: 139 mmol/L (ref 135–145)

## 2014-05-28 LAB — CBC
HCT: 40.9 % (ref 39.0–52.0)
Hemoglobin: 13.4 g/dL (ref 13.0–17.0)
MCH: 28.2 pg (ref 26.0–34.0)
MCHC: 32.8 g/dL (ref 30.0–36.0)
MCV: 86.1 fL (ref 78.0–100.0)
Platelets: 150 10*3/uL (ref 150–400)
RBC: 4.75 MIL/uL (ref 4.22–5.81)
RDW: 13.6 % (ref 11.5–15.5)
WBC: 9.5 10*3/uL (ref 4.0–10.5)

## 2014-05-28 LAB — POCT ACTIVATED CLOTTING TIME
Activated Clotting Time: 183 seconds
Activated Clotting Time: 196 seconds
Activated Clotting Time: 214 seconds
Activated Clotting Time: 165 seconds

## 2014-05-28 LAB — TYPE AND SCREEN
ABO/RH(D): O POS
Antibody Screen: NEGATIVE

## 2014-05-28 LAB — MRSA PCR SCREENING: MRSA by PCR: NEGATIVE

## 2014-05-28 SURGERY — ANGIOGRAM, LOWER EXTREMITY
Laterality: Left

## 2014-05-28 MED ORDER — OXYCODONE-ACETAMINOPHEN 10-325 MG PO TABS: 1.0000 | ORAL_TABLET | Freq: Three times a day (TID) | ORAL | Status: DC | PRN

## 2014-05-28 MED ORDER — MIDAZOLAM HCL 2 MG/2ML IJ SOLN
INTRAMUSCULAR | Status: AC
  Filled 2014-05-28: qty 2

## 2014-05-28 MED ORDER — MAGNESIUM 250 MG PO TABS: 250.0000 mg | ORAL_TABLET | Freq: Every day | ORAL | Status: DC

## 2014-05-28 MED ORDER — LIDOCAINE HCL (PF) 1 % IJ SOLN
INTRAMUSCULAR | Status: AC
  Filled 2014-05-28: qty 30

## 2014-05-28 MED ORDER — HEPARIN SODIUM (PORCINE) 1000 UNIT/ML IJ SOLN
INTRAMUSCULAR | Status: AC
  Filled 2014-05-28: qty 1

## 2014-05-28 MED ORDER — FENTANYL CITRATE 0.05 MG/ML IJ SOLN
INTRAMUSCULAR | Status: AC
  Filled 2014-05-28: qty 2

## 2014-05-28 MED ORDER — OXYCODONE-ACETAMINOPHEN 5-325 MG PO TABS: 1.0000 | ORAL_TABLET | Freq: Three times a day (TID) | ORAL | Status: DC | PRN

## 2014-05-28 MED ORDER — SODIUM CHLORIDE 0.9 % IV SOLN: INTRAVENOUS | Status: AC

## 2014-05-28 MED ORDER — OXYCODONE HCL 5 MG PO TABS
5.0000 mg | ORAL_TABLET | Freq: Three times a day (TID) | ORAL | Status: DC | PRN
  Administered 2014-05-28: 5 mg via ORAL
  Filled 2014-05-28: qty 1

## 2014-05-28 MED ORDER — NITROGLYCERIN 0.4 MG SL SUBL: 0.4000 mg | SUBLINGUAL_TABLET | SUBLINGUAL | Status: DC | PRN

## 2014-05-28 MED ORDER — SODIUM CHLORIDE 0.9 % IJ SOLN: 3.0000 mL | INTRAMUSCULAR | Status: DC | PRN

## 2014-05-28 MED ORDER — ASPIRIN 81 MG PO CHEW
CHEWABLE_TABLET | ORAL | Status: AC
  Filled 2014-05-28: qty 1

## 2014-05-28 MED ORDER — OMEGA-3-ACID ETHYL ESTERS 1 G PO CAPS
1.0000 g | ORAL_CAPSULE | Freq: Every day | ORAL | Status: DC
  Administered 2014-05-28: 21:00:00 1 g via ORAL
  Filled 2014-05-28 (×2): qty 1

## 2014-05-28 MED ORDER — SODIUM CHLORIDE 0.9 % IV SOLN: 250.0000 mL | INTRAVENOUS | Status: DC | PRN

## 2014-05-28 MED ORDER — CLONAZEPAM 0.5 MG PO TABS
2.0000 mg | ORAL_TABLET | Freq: Every day | ORAL | Status: DC
  Administered 2014-05-28: 2 mg via ORAL
  Filled 2014-05-28: qty 4

## 2014-05-28 MED ORDER — ESCITALOPRAM OXALATE 20 MG PO TABS
20.0000 mg | ORAL_TABLET | Freq: Every day | ORAL | Status: DC
  Filled 2014-05-28: qty 1

## 2014-05-28 MED ORDER — FENTANYL CITRATE 0.05 MG/ML IJ SOLN
INTRAMUSCULAR | Status: AC
  Administered 2014-05-28: 50 ug via INTRAVENOUS
  Filled 2014-05-28: qty 2

## 2014-05-28 MED ORDER — HEPARIN (PORCINE) IN NACL 2-0.9 UNIT/ML-% IJ SOLN
INTRAMUSCULAR | Status: AC
  Filled 2014-05-28: qty 1000

## 2014-05-28 MED ORDER — SODIUM CHLORIDE 0.9 % IV SOLN
INTRAVENOUS | Status: DC
  Administered 2014-05-28: 100 mL/h via INTRAVENOUS

## 2014-05-28 MED ORDER — SODIUM CHLORIDE 0.9 % IJ SOLN: 3.0000 mL | Freq: Two times a day (BID) | INTRAMUSCULAR | Status: DC

## 2014-05-28 MED ORDER — ASPIRIN 81 MG PO CHEW
81.0000 mg | CHEWABLE_TABLET | ORAL | Status: AC
  Administered 2014-05-28: 81 mg via ORAL

## 2014-05-28 MED ORDER — LEVOTHYROXINE SODIUM 100 MCG PO TABS
100.0000 ug | ORAL_TABLET | Freq: Every day | ORAL | Status: DC
  Filled 2014-05-28 (×2): qty 1

## 2014-05-28 MED ORDER — ATORVASTATIN CALCIUM 40 MG PO TABS
40.0000 mg | ORAL_TABLET | Freq: Every day | ORAL | Status: DC
  Administered 2014-05-28: 40 mg via ORAL
  Filled 2014-05-28 (×2): qty 1

## 2014-05-28 MED ORDER — PANTOPRAZOLE SODIUM 40 MG PO TBEC
80.0000 mg | DELAYED_RELEASE_TABLET | Freq: Every day | ORAL | Status: DC
  Administered 2014-05-28: 21:00:00 80 mg via ORAL
  Filled 2014-05-28: qty 2

## 2014-05-28 MED ORDER — MAGNESIUM OXIDE 400 (241.3 MG) MG PO TABS
400.0000 mg | ORAL_TABLET | Freq: Every day | ORAL | Status: DC
  Filled 2014-05-28: qty 1

## 2014-05-28 MED ORDER — FENTANYL CITRATE 0.05 MG/ML IJ SOLN
50.0000 ug | Freq: Once | INTRAMUSCULAR | Status: AC
  Administered 2014-05-28: 50 ug via INTRAVENOUS

## 2014-05-28 NOTE — Progress Notes (Signed)
Pt continues to state he is feeling better. VS continue to improve.  Chip, R.N. From cath lab still holding pressure.

## 2014-05-28 NOTE — Progress Notes (Signed)
Report called to Dina Rich RN on [phone].

## 2014-05-28 NOTE — Interval H&P Note (Signed)
History and Physical Interval Note:  05/28/2014 8:48 AM  Jeff [name]  has presented today for surgery, with the diagnosis of pvd  The various methods of treatment have been discussed with the patient and family. After consideration of risks, benefits and other options for treatment, the patient has consented to  Procedure(s): LOWER EXTREMITY ANGIOGRAM (N/A) as a surgical intervention .  The patient's history has been reviewed, patient examined, no change in status, stable for surgery.  I have reviewed the patient's chart and labs.  Questions were answered to the patient's satisfaction.     Kathlyn Sacramento

## 2014-05-28 NOTE — Progress Notes (Signed)
Took over direct pressure @ 1405 for Chip [employer] in [place] Stay on a post procedure re-bleed lfa.  Dr Fletcher Anon came by about 1420 and assessed groin as having no increase in hematoma. Area soft with edema noted to scrotal area.  No pain or tenderness at site.  Dr Fletcher Anon ordered a Femostop to be applied.  Barbaraann Rondo Cox arrived from [employer] and assisted with application of Femostop just inferior to the retrograde access site.  Inflated to 60 Hg/mm with complete hemostasis.  Distal DP and PT easily doppled after application completed.  Pt instructions given and pt left in stable condition.

## 2014-05-28 NOTE — Progress Notes (Addendum)
Pt c/o pain in (L) scrotum. States he feels like its getting " big and tight."  Upon assessment, (L) scrotum did appear to be harder than the right and tight to touch.  Cath lab and Dr. Fletcher Anon notified and pressure applied.  Pts BP starting to drop (see VS) Another IV started and NS bolus hung x2.  Dr. Kellie Simmering was consulted and assessed pt.  Stat labs obtained.  Pt was also given fentanyl for pain.

## 2014-05-28 NOTE — Progress Notes (Signed)
Patient transported to CT by Benita Gutter RN and Precious Gilding RN. Patient monitored. CT performed with out incident and Dr Fletcher Anon notified that CT complete after returning to room. Dr Fletcher Anon said to transfer patient at this time to 6c. Will Call report.

## 2014-05-28 NOTE — Progress Notes (Signed)
Patient transferred to [facility] without incident.

## 2014-05-28 NOTE — Progress Notes (Signed)
Pt states his pain is getting less and he is starting to feel better.  Chip Anderson,R.N present and took over holding pressure for Dr. Fletcher Anon.  Pt's BP improving. See VS.

## 2014-05-28 NOTE — Progress Notes (Signed)
Site area: LFA Site Prior to Removal:  Level 0 Pressure Applied For: 98min Manual:   yes Patient Status During Pull:  stable Post Pull Site:  Level o Post Pull Instructions Given:  yes Post Pull Pulses Present: doppler Dressing Applied:  clear Bedrest begins @ 1145 Comments:

## 2014-05-28 NOTE — CV Procedure (Signed)
    PERIPHERAL VASCULAR PROCEDURE  NAME:  Jeff [name]   MRN: [id number] DOB:  1965-10-11   ADMIT DATE: 05/28/2014  Performing Cardiologist: Kathlyn Sacramento Primary Physician: Lillard Anes, MD   Procedures Performed:  Antegrade left common femoral arterial access  Selective left lower extremity arterial angiography  Attempted unsuccessful angioplasty of the left popliteal artery due to inability to cross the occlusion    Indication(s):   Claudication   Consent: The procedure with Risks/Benefits/Alternatives and Indications was reviewed with the patient .  All questions were answered.  Medications:  Sedation:  1 mg IV Versed, 50 mcg IV Fentanyl  Contrast:  50 ml  Visipaque   Procedural details: The left groin was prepped, draped, and anesthetized with 1% lidocaine. Using modified Seldinger technique, a 4 French micopuncture sheath was introduced into the left common femoral artery. However, the sheath entry site was in the profunda artery. Thus, the sheath was removed. Manual pressure was placed for 10 minutes. I then stuck the artery higher and was able to place the micropuncture sheath in the left common femoral artery into the left SFA. This was exchanged into a 6 French 45 cm destination sheath. A total of 8000 units of unfractionated heparin was given with an ACT of 2:15. I then attempted to cross the occlusion over an 018 quick cross catheter. I initially used a feeder XT wire then a Confianza Pro wire then an approach CTO wire 18 g tip followed by 24 g tip. I was not able to reach the distal Of the occlusion. I then used an avid cross catheter over a Glidewire. I was able to reach the distal cap but not able to enter the lumen distally. The catheter actually created a dissection track. Thus, I decided to abort the procedure due to increased risk of perforation.  The patient tolerated the procedure well with no immediate complications.    Hemodynamics:  Central  Aortic Pressure / Mean Aortic Pressure: 110/70  Findings: No significant left SFA disease. Short occlusion of the left popliteal artery with high bifurcation of the anterior tibial and TP trunk. Collaterals noted.  Conclusions: Unsuccessful left popliteal artery angioplasty due to inability to cross the chronic occlusion in spite of using multiple wires and catheters.  Recommendations:  Consider retrograde pedal access and attempted angioplasty.   Kathlyn Sacramento, MD, [facility] 05/28/2014 10:26 AM

## 2014-05-28 NOTE — Discharge Instructions (Signed)
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
## 2014-05-29 DIAGNOSIS — I739 Peripheral vascular disease, unspecified: Secondary | ICD-10-CM

## 2014-05-29 DIAGNOSIS — I70212 Atherosclerosis of native arteries of extremities with intermittent claudication, left leg: Secondary | ICD-10-CM | POA: Diagnosis not present
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
## 2014-05-29 NOTE — Progress Notes (Signed)
Subjective: Left groin a little tender.  Objective: Vital signs in last 24 hours: Temp:  [97.9 F (36.6 C)-98.1 F (36.7 C)] 97.9 F (36.6 C) (01/14 0603) Pulse Rate:  [43-168] 91 (01/14 0603) Resp:  [8-26] 20 (01/14 0603) BP: (83-137)/(44-93) 112/71 mmHg (01/14 0603) SpO2:  [97 %-100 %] 97 % (01/14 0603)    Intake/Output from previous day: 01/13 0701 - 01/14 0700 In: 606.7 [I.V.:606.7] Out: 1575 [Urine:1575] Intake/Output this shift: Total I/O In: -  Out: 800 [Urine:800]  Medications Current Facility-Administered Medications  Medication Dose Route Frequency Provider Last Rate Last Dose  . atorvastatin (LIPITOR) tablet 40 mg  40 mg Oral QHS Almyra Deforest, PA   40 mg at 05/28/14 2333  . clonazePAM (KLONOPIN) tablet 2 mg  2 mg Oral QHS Almyra Deforest, PA   2 mg at 05/28/14 2332  . escitalopram (LEXAPRO) tablet 20 mg  20 mg Oral Daily Almyra Deforest, [place]      . levothyroxine (SYNTHROID, LEVOTHROID) tablet 100 mcg  100 mcg Oral QAC breakfast Almyra Deforest, [place]      . magnesium oxide (MAG-OX) tablet 400 mg  400 mg Oral Daily Wellington Hampshire, MD      . nitroGLYCERIN (NITROSTAT) SL tablet 0.4 mg  0.4 mg Sublingual Q5 min PRN Almyra Deforest, PA      . omega-3 acid ethyl esters (LOVAZA) capsule 1 g  1 g Oral Daily Almyra Deforest, [place]   1 g at 05/28/14 2113  . oxyCODONE-acetaminophen (PERCOCET/ROXICET) 5-325 MG per tablet 1 tablet  1 tablet Oral Q8H PRN Wellington Hampshire, MD       And  . oxyCODONE (Oxy IR/ROXICODONE) immediate release tablet 5 mg  5 mg Oral Q8H PRN Wellington Hampshire, MD   5 mg at 05/28/14 2113  . pantoprazole (PROTONIX) EC tablet 80 mg  80 mg Oral Daily Almyra Deforest, [place]   80 mg at 05/28/14 2112    PE: General appearance: alert, cooperative and no distress Lungs: clear to auscultation bilaterally Heart: regular rate and rhythm, S1, S2 normal, no murmur, click, rub or gallop Extremities: No LEE Pulses: 2+ radials. 1+ left DP, 0 left PT Skin: Warm and dry.  left groin:  mildly tender.  soft hematoma,  scrotal swelling Neurologic: Grossly normal  Lab Results:   Recent Labs  05/28/14 1254  WBC 9.5  HGB 13.4  HCT 40.9  PLT 150   BMET  Recent Labs  05/28/14 1254  NA 139  K 4.9  CL 108  CO2 24  GLUCOSE 99  BUN 7  CREATININE 1.03  CALCIUM 8.3*     Assessment/Plan  Active Problems:   PAD (peripheral artery disease)  Plan: SP PV angiogram revealing no significant left SFA disease. Short occlusion of the left popliteal artery with high bifurcation of the anterior tibial and TP trunk. Collaterals noted.Unsuccessful left popliteal artery angioplasty due to inability to cross the chronic occlusion in spite of using multiple wires and catheters.  Per Dr. Fletcher Anon: Consider retrograde pedal access and attempted angioplasty.    CT abd/pel: Acute hematoma in the left scrotal sac, with stranding and hemorrhage in the left inguinal region, related to angiogram performed earlier the same day.  Groin looks stable.  No lifting or driving for three days  BP and HR stable.  DC home today.    LOS: 1 day    HAGER, BRYAN PA-C 05/29/2014 6:37 AM  I have personally seen and examined this patient with Tarri Fuller,  PA-C. I agree with the assessment and plan as outlined above. He is s/p PV procedure with attempted PTA of the left popliteal artery. Dr. Fletcher Anon was unable to cross the occluded popliteal artery. Pt with scrotal hematoma post procedure. Stable this am. It is sore but soft. He wishes to go home. Will discharge home with plans for follow up with Dr. Fletcher Anon to discuss further intervention on left leg. If he has any change in scrotal hematoma, should come to ED. Follow up with office APP one week.   Jaleiyah Alas 05/29/2014 7:21 AM

## 2014-06-03 ENCOUNTER — Ambulatory Visit (INDEPENDENT_AMBULATORY_CARE_PROVIDER_SITE_OTHER): Payer: Medicaid Other | Admitting: Cardiovascular Disease

## 2014-06-03 ENCOUNTER — Encounter: Payer: Self-pay | Admitting: Cardiovascular Disease

## 2014-06-03 VITALS — BP 126/72 | HR 98 | Ht 70.0 in | Wt 241.0 lb

## 2014-06-03 DIAGNOSIS — I10 Essential (primary) hypertension: Secondary | ICD-10-CM

## 2014-06-03 DIAGNOSIS — E785 Hyperlipidemia, unspecified: Secondary | ICD-10-CM

## 2014-06-03 DIAGNOSIS — I739 Peripheral vascular disease, unspecified: Secondary | ICD-10-CM

## 2014-06-03 MED ORDER — ZOLPIDEM TARTRATE 5 MG PO TABS: 5.0000 mg | ORAL_TABLET | Freq: Every evening | ORAL | Status: DC | PRN
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
## 2014-06-03 NOTE — Patient Instructions (Signed)
Your physician recommends that you continue on your current medications as directed. Please refer to the Current Medication list given to you today.  Your physician recommends that you schedule a follow-up appointment in: 2 MONTHS with Dr Fletcher Anon

## 2014-06-06 NOTE — Assessment & Plan Note (Signed)
Blood pressure is controlled on current medications. 

## 2014-06-06 NOTE — Assessment & Plan Note (Signed)
Unfortunately, the left popliteal artery occlusion could not be crossed. The procedure was complicated by a hematoma. He continues to have discomfort. Continue with pain medications. He is also having hard time sleeping at night and thus I provided him with short-term course of Ambien. The options in the future include continued medical therapy ,  attempted angioplasty via the retrograde pedal approach for short bypass.  I will reevaluate symptoms in 2 months. I instructed him to notify me if the hematoma worsens. It appears to be significantly better since he left the hospital.

## 2014-06-06 NOTE — Assessment & Plan Note (Signed)
Lab Results  Component Value Date   CHOL 107 06/12/2012   HDL 43 06/12/2012   LDLCALC 45 06/12/2012   LDLDIRECT 43 06/11/2012   TRIG 93 06/12/2012   CHOLHDL 2.5 06/12/2012   Continue treatment with atorvastatin with a target LDL of less than 70.

## 2014-07-03 DIAGNOSIS — R252 Cramp and spasm: Secondary | ICD-10-CM | POA: Insufficient documentation

## 2014-08-05 ENCOUNTER — Ambulatory Visit (INDEPENDENT_AMBULATORY_CARE_PROVIDER_SITE_OTHER): Payer: Medicaid Other | Admitting: Cardiovascular Disease

## 2014-08-05 ENCOUNTER — Encounter: Payer: Self-pay | Admitting: Cardiovascular Disease

## 2014-08-05 ENCOUNTER — Ambulatory Visit: Payer: Medicaid Other | Admitting: Cardiovascular Disease

## 2014-08-05 VITALS — BP 130/72 | HR 78 | Ht 70.0 in | Wt 246.8 lb

## 2014-08-05 DIAGNOSIS — I739 Peripheral vascular disease, unspecified: Secondary | ICD-10-CM | POA: Diagnosis not present

## 2014-08-05 DIAGNOSIS — I10 Essential (primary) hypertension: Secondary | ICD-10-CM | POA: Diagnosis not present

## 2014-08-05 DIAGNOSIS — E785 Hyperlipidemia, unspecified: Secondary | ICD-10-CM | POA: Diagnosis not present

## 2014-08-05 MED ORDER — NITROGLYCERIN 0.4 MG SL SUBL: 0.4000 mg | SUBLINGUAL_TABLET | SUBLINGUAL | Status: DC | PRN

## 2014-08-05 NOTE — Patient Instructions (Signed)
Your physician wants you to follow-up in: 4 MONTHS with Dr Fletcher Anon.  You will receive a reminder letter in the mail two months in advance. If you don't receive a letter, please call our office to schedule the follow-up appointment.  Your physician recommends that you continue on your current medications as directed. Please refer to the Current Medication list given to you today.
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
## 2014-08-07 NOTE — Assessment & Plan Note (Signed)
Continue treatment with atorvastatin with a target LDL of less than 70. 

## 2014-08-07 NOTE — Assessment & Plan Note (Signed)
Blood pressure is controlled on current medications. 

## 2014-08-07 NOTE — Assessment & Plan Note (Signed)
He continues to have severe left calf claudication. I had a prolonged discussion with him about further management options including reattempted endovascular intervention likely via the retrograde approach versus   bypass surgery. I think before considering further revascularization we have to see if symptoms can be improved with a walking exercise program given that there is no life-threatening ischemia. I discussed with him starting a walking exercise program of 45 minutes daily.

## 2014-10-24 ENCOUNTER — Other Ambulatory Visit: Payer: Self-pay | Admitting: Cardiovascular Disease

## 2014-10-24 ENCOUNTER — Emergency Department (HOSPITAL_COMMUNITY)
Admission: EM | Admit: 2014-10-24 | Discharge: 2014-10-24 | Disposition: A | Discharge status: Home / Self Care | Payer: Medicaid Other | Attending: Emergency Medicine | Admitting: Emergency Medicine

## 2014-10-24 ENCOUNTER — Emergency Department (HOSPITAL_COMMUNITY): Payer: Medicaid Other

## 2014-10-24 ENCOUNTER — Encounter (HOSPITAL_COMMUNITY): Payer: Self-pay | Admitting: *Deleted

## 2014-10-24 DIAGNOSIS — R42 Dizziness and giddiness: Secondary | ICD-10-CM | POA: Diagnosis present

## 2014-10-24 DIAGNOSIS — I252 Old myocardial infarction: Secondary | ICD-10-CM | POA: Diagnosis not present

## 2014-10-24 DIAGNOSIS — G8929 Other chronic pain: Secondary | ICD-10-CM | POA: Diagnosis not present

## 2014-10-24 DIAGNOSIS — E0591 Thyrotoxicosis, unspecified with thyrotoxic crisis or storm: Secondary | ICD-10-CM | POA: Diagnosis not present

## 2014-10-24 DIAGNOSIS — M1990 Unspecified osteoarthritis, unspecified site: Secondary | ICD-10-CM | POA: Insufficient documentation

## 2014-10-24 DIAGNOSIS — Z7982 Long term (current) use of aspirin: Secondary | ICD-10-CM | POA: Diagnosis not present

## 2014-10-24 DIAGNOSIS — R200 Anesthesia of skin: Secondary | ICD-10-CM | POA: Insufficient documentation

## 2014-10-24 DIAGNOSIS — E860 Dehydration: Secondary | ICD-10-CM | POA: Diagnosis not present

## 2014-10-24 DIAGNOSIS — E780 Pure hypercholesterolemia: Secondary | ICD-10-CM | POA: Diagnosis not present

## 2014-10-24 DIAGNOSIS — Z79899 Other long term (current) drug therapy: Secondary | ICD-10-CM | POA: Insufficient documentation

## 2014-10-24 DIAGNOSIS — I10 Essential (primary) hypertension: Secondary | ICD-10-CM | POA: Insufficient documentation

## 2014-10-24 DIAGNOSIS — K219 Gastro-esophageal reflux disease without esophagitis: Secondary | ICD-10-CM | POA: Insufficient documentation

## 2014-10-24 DIAGNOSIS — E890 Postprocedural hypothyroidism: Secondary | ICD-10-CM | POA: Diagnosis not present

## 2014-10-24 LAB — COMPREHENSIVE METABOLIC PANEL
ALT: 32 U/L (ref 17–63)
AST: 29 U/L (ref 15–41)
Albumin: 3.6 g/dL (ref 3.5–5.0)
Alkaline Phosphatase: 75 U/L (ref 38–126)
Anion gap: 10 (ref 5–15)
BUN: 9 mg/dL (ref 6–20)
CO2: 22 mmol/L (ref 22–32)
Calcium: 8.8 mg/dL — ABNORMAL LOW (ref 8.9–10.3)
Chloride: 106 mmol/L (ref 101–111)
Creatinine, Ser: 1.25 mg/dL — ABNORMAL HIGH (ref 0.61–1.24)
GFR calc Af Amer: >60 mL/min (ref >60)
GFR calc non Af Amer: >60 mL/min (ref >60)
Glucose, Bld: 92 mg/dL (ref 65–99)
Potassium: 3.8 mmol/L (ref 3.5–5.1)
Sodium: 138 mmol/L (ref 135–145)
Total Bilirubin: 0.5 mg/dL (ref 0.3–1.2)
Total Protein: 6.9 g/dL (ref 6.5–8.1)

## 2014-10-24 LAB — I-STAT TROPONIN, ED: Troponin i, poc: 0 ng/mL (ref 0.00–0.08)

## 2014-10-24 LAB — CBC
HCT: 42.6 % (ref 39.0–52.0)
Hemoglobin: 14.1 g/dL (ref 13.0–17.0)
MCH: 28 pg (ref 26.0–34.0)
MCHC: 33.1 g/dL (ref 30.0–36.0)
MCV: 84.5 fL (ref 78.0–100.0)
Platelets: 175 10*3/uL (ref 150–400)
RBC: 5.04 MIL/uL (ref 4.22–5.81)
RDW: 13.5 % (ref 11.5–15.5)
WBC: 8.1 10*3/uL (ref 4.0–10.5)

## 2014-10-24 LAB — PROTIME-INR
INR: 0.93 (ref 0.00–1.49)
Prothrombin Time: 12.7 seconds (ref 11.6–15.2)

## 2014-10-24 LAB — DIFFERENTIAL
Basophils Absolute: 0 10*3/uL (ref 0.0–0.1)
Basophils Relative: 0 % (ref 0–1)
Eosinophils Absolute: 0.2 10*3/uL (ref 0.0–0.7)
Eosinophils Relative: 2 % (ref 0–5)
Lymphocytes Relative: 37 % (ref 12–46)
Lymphs Abs: 3 10*3/uL (ref 0.7–4.0)
Monocytes Absolute: 0.7 10*3/uL (ref 0.1–1.0)
Monocytes Relative: 8 % (ref 3–12)
Neutro Abs: 4.2 10*3/uL (ref 1.7–7.7)
Neutrophils Relative %: 53 % (ref 43–77)

## 2014-10-24 LAB — APTT: aPTT: 28 seconds (ref 24–37)

## 2014-10-24 MED ORDER — SODIUM CHLORIDE 0.9 % IV BOLUS (SEPSIS)
1000.0000 mL | Freq: Once | INTRAVENOUS | Status: AC
  Administered 2014-10-24: 1000 mL via INTRAVENOUS

## 2014-10-24 MED ORDER — MECLIZINE HCL 25 MG PO TABS: 25.0000 mg | ORAL_TABLET | Freq: Three times a day (TID) | ORAL | Status: DC | PRN

## 2014-10-24 MED ORDER — MECLIZINE HCL 25 MG PO TABS
25.0000 mg | ORAL_TABLET | Freq: Once | ORAL | Status: AC
  Administered 2014-10-24: 25 mg via ORAL
  Filled 2014-10-24: qty 1

## 2014-10-24 MED ORDER — METOCLOPRAMIDE HCL 5 MG/ML IJ SOLN
10.0000 mg | Freq: Once | INTRAMUSCULAR | Status: AC
  Administered 2014-10-24: 10 mg via INTRAVENOUS
  Filled 2014-10-24: qty 2

## 2014-10-24 MED ORDER — DIPHENHYDRAMINE HCL 50 MG/ML IJ SOLN
12.5000 mg | Freq: Once | INTRAMUSCULAR | Status: AC
  Administered 2014-10-24: 12.5 mg via INTRAVENOUS
  Filled 2014-10-24: qty 1

## 2014-10-24 NOTE — ED Notes (Signed)
Pt reports dizziness, headache, facial numbness x 2 weeks.

## 2014-10-24 NOTE — ED Provider Notes (Signed)
CSN: [id number]     Arrival date & time 10/24/14  1700 History   First MD Initiated Contact with Patient 10/24/14 1732     Chief Complaint  Patient presents with  . Dizziness  . Numbness     (Consider location/radiation/quality/duration/timing/severity/associated sxs/prior Treatment) The history is provided by the patient.  Jeff [name] is a 49 y.o. male hx of HTN, NSTEMI, PAD here with dizziness, headaches. Patient has been having some dizziness and intermittent headaches for the last 2 weeks. States that he sometimes feels like the room is spinning. Symptoms feel lightheaded and dizzy but didn't pass out. Intermittent headaches in the frontal area associated with some facial numbness. He feels too dizzy to go running today so came here for evaluation.    Past Medical History  Diagnosis Date  . HYPERTENSION 03/16/2010  . HYPERTHYROIDISM 03/16/2010    s/p RAI  . THYROID STORM 03/16/2010  . Altered mental status 03/16/2010  . Hypothyroidism following radioiodine therapy   . NSTEMI (non-ST elevated myocardial infarction)     05/2012:  LHC with normal EF and no CAD (? spasm)  . GERD (gastroesophageal reflux disease)   . Arthritis   . PAD (peripheral artery disease)     a.  ABI (01/2014):  L 0.53; R 1.0 >> referred to PV  . High cholesterol   . Chronic back pain     "from the neck to the lower back" (05/28/2014)   Past Surgical History  Procedure Laterality Date  . Back surgery    . Anterior cervical decomp/discectomy fusion  08/2010  . Shoulder arthroscopy Right 2000    debridement,  . Posterior cervical fusion/foraminotomy N/A 12/10/2012    Procedure: POSTERIOR CERVICAL FUSION/FORAMINOTOMY LEVEL 1;  Surgeon: Ophelia Charter, MD;  Location: [place];  Service: Neurosurgery;  Laterality: N/A;  Cervical three-four posterior cervical fusion with lateral mass screws  . Left heart catheterization with coronary angiogram N/A 06/12/2012    Procedure: LEFT HEART CATHETERIZATION WITH  CORONARY ANGIOGRAM;  Surgeon: Josue Hector, MD;  Location: [facility] CATH LAB;  Service: Cardiovascular;  Laterality: N/A;  . Abdominal aortagram N/A 02/19/2014    Procedure: ABDOMINAL Maxcine Ham;  Surgeon: Wellington Hampshire, MD;  Location: [facility] CATH LAB;  Service: Cardiovascular;  Laterality: N/A;  . Popliteal artery angioplasty Left 05/28/2014    unsuccessful/notes 05/28/2014  . Umbilical hernia repair  1980  . Hernia repair  ~ 1980    UHR  . Lumbar disc surgery  03/2009    "cut out 1/2 the disc"  . Posterior lumbar fusion  10/2009; 07/2013  . Shoulder arthroscopy w/ rotator cuff repair Right 2004  . Lower extremity angiogram Left 05/28/2014    Procedure: LOWER EXTREMITY ANGIOGRAM;  Surgeon: Wellington Hampshire, MD;  Location: [facility] CATH LAB;  Service: Cardiovascular;  Laterality: Left;  Failed PTA CTO   Family History  Problem Relation Age of Onset  . Thyroid disease Mother     hypothyroidism  . Arthritis Other   . Hypertension Other   . Heart disease Other     Parents, Grandparents  . Diabetes Other     Grandparents  . Heart attack Maternal Grandfather    History  Substance Use Topics  . Smoking status: Never Smoker   . Smokeless tobacco: Never Used  . Alcohol Use: 0.0 oz/week     Comment: 05/28/2014 "might have a few beers a couple times/month"    Review of Systems  Neurological: Positive for dizziness.  All other  systems reviewed and are negative.     Allergies  Vicodin  Home Medications   Prior to Admission medications   Medication Sig Start Date End Date Taking? Authorizing Provider  amLODipine (NORVASC) 5 MG tablet TAKE 1 TABLET (5 MG TOTAL) BY MOUTH DAILY. 03/17/14   Darlin Coco, MD  aspirin EC 81 MG tablet Take 1 tablet (81 mg total) by mouth daily. 01/28/14   Liliane Shi, PA-C  atorvastatin (LIPITOR) 40 MG tablet Take 40 mg by mouth at bedtime.     Historical Provider, MD  cilostazol (PLETAL) 100 MG tablet TAKE 1 TABLET BY MOUTH TWICE DAILY. 10/24/14   Wellington Hampshire,  MD  clonazePAM (KLONOPIN) 2 MG tablet Take 2 mg by mouth at bedtime.    Historical Provider, MD  escitalopram (LEXAPRO) 20 MG tablet Take 20 mg by mouth daily.  05/14/14   Historical Provider, MD  levothyroxine (SYNTHROID, LEVOTHROID) 100 MCG tablet Take 1 tablet (100 mcg total) by mouth daily before breakfast. 02/12/14   Renato Shin, MD  Magnesium 250 MG TABS Take 250 mg by mouth daily.    Historical Provider, MD  metoprolol (LOPRESSOR) 50 MG tablet Take 0.5 tablets (25 mg total) by mouth 2 (two) times daily. 01/28/14   Liliane Shi, PA-C  nitroGLYCERIN (NITROSTAT) 0.4 MG SL tablet Place 1 tablet (0.4 mg total) under the tongue every 5 (five) minutes as needed for chest pain. 08/05/14   Wellington Hampshire, MD  omega-3 acid ethyl esters (LOVAZA) 1 G capsule Take 1 g by mouth daily.    Historical Provider, MD  omeprazole (PRILOSEC) 40 MG capsule Take 40 mg by mouth daily.    Historical Provider, MD  oxyCODONE-acetaminophen (PERCOCET) 10-325 MG per tablet Take 1 tablet by mouth every 8 (eight) hours as needed for pain (for back pain).  05/15/14   Historical Provider, MD  POTASSIUM PO Take 1 tablet by mouth daily. Pt doesn't know how may mgs he is taking.    Historical Provider, MD  zolpidem (AMBIEN) 5 MG tablet Take 1 tablet (5 mg total) by mouth at bedtime as needed for sleep. 06/03/14   Wellington Hampshire, MD   BP 118/74 mmHg  Pulse 81  Temp(Src) 98.2 F (36.8 C) (Oral)  Resp 18  Ht 5\' 10"  (1.778 m)  Wt 241 lb 11.2 oz (109.634 kg)  BMI 34.68 kg/m2  SpO2 100% Physical Exam  Constitutional: He is oriented to person, place, and time. He appears well-developed and well-nourished.  HENT:  Head: Normocephalic.  Right Ear: External ear normal.  Left Ear: External ear normal.  Mouth/Throat: Oropharynx is clear and moist.  Eyes: Conjunctivae are normal. Pupils are equal, round, and reactive to light.  Mild leftward nystagmus only when he stands up but not laying down. Doesn't change with direction    Neck: Normal range of motion. Neck supple.  Cardiovascular: Normal rate, regular rhythm and normal heart sounds.   Pulmonary/Chest: Effort normal and breath sounds normal. No respiratory distress. He has no wheezes. He has no rales.  Abdominal: Soft. Bowel sounds are normal. He exhibits no distension. There is no tenderness. There is no rebound and no guarding.  Musculoskeletal: Normal range of motion. He exhibits no edema or tenderness.  Neurological: He is alert and oriented to person, place, and time. No cranial nerve deficit. Coordination normal.  CN 2-12 intact. Nl finger to nose. No pronator drift. Nl gait   Skin: Skin is warm and dry.  Psychiatric: He has a  normal mood and affect. His behavior is normal. Judgment and thought content normal.  Nursing note and vitals reviewed.   ED Course  Procedures (including critical care time) Labs Review Labs Reviewed  COMPREHENSIVE METABOLIC PANEL - Abnormal; Notable for the following:    Creatinine, Ser 1.25 (*)    Calcium 8.8 (*)    All other components within normal limits  PROTIME-INR  APTT  CBC  DIFFERENTIAL  I-STAT TROPOININ, ED    Imaging Review Ct Head Wo Contrast  10/24/2014   CLINICAL DATA:  Dizziness with headaches and facial numbness for 2 weeks. History of hypertension and hypothyroidism. Initial encounter.  EXAM: CT HEAD WITHOUT CONTRAST  TECHNIQUE: Contiguous axial images were obtained from the base of the skull through the vertex without intravenous contrast.  COMPARISON:  Head CT 11/30/2009.  FINDINGS: There is no evidence of acute intracranial hemorrhage, mass lesion, brain edema or extra-axial fluid collection. The ventricles and subarachnoid spaces are unchanged with prominent CSF spaces anterior to both temporal lobes. There is no CT evidence of acute cortical infarction.  The visualized paranasal sinuses, mastoid air cells and middle ears are clear. The calvarium is intact.  IMPRESSION: Stable examination.  No acute  intracranial findings.   Electronically Signed   By: Richardean Sale M.D.   On: 10/24/2014 17:58     EKG Interpretation   Date/Time:  Friday October 24 2014 17:07:02 EDT Ventricular Rate:  82 PR Interval:  142 QRS Duration: 84 QT Interval:  364 QTC Calculation: 425 R Axis:   22 Text Interpretation:  Normal sinus rhythm Normal ECG No significant change  since last tracing Confirmed by Anquan Azzarello  MD, Luda Charbonneau ([id number]) on 10/24/2014  5:32:31 PM      MDM   Final diagnoses:  None   Jeff [name] is a 49 y.o. male here with vertigo. Likely peripheral vertigo vs migraines. Will get labs, CT head. Will give meclizine and migraine cocktail.   6:45 PM CT head unremarkable. Cr. 1.2. Likely dehydrated. Given IVF and migraine cocktail and meclizine and now feels better. Will dc home.    Wandra Arthurs, MD 10/24/14 [phone]

## 2014-10-24 NOTE — Discharge Instructions (Signed)
Take meclizine for dizziness.   Stay hydrated.   Follow up with your doctor.  Return to ER if you have worse dizziness, headaches, vomiting.

## 2014-12-09 ENCOUNTER — Ambulatory Visit (INDEPENDENT_AMBULATORY_CARE_PROVIDER_SITE_OTHER): Payer: Medicaid Other | Admitting: Cardiovascular Disease

## 2014-12-09 ENCOUNTER — Encounter: Payer: Self-pay | Admitting: Cardiovascular Disease

## 2014-12-09 VITALS — BP 134/98 | HR 78 | Ht 70.0 in | Wt 246.0 lb

## 2014-12-09 DIAGNOSIS — N5201 Erectile dysfunction due to arterial insufficiency: Secondary | ICD-10-CM

## 2014-12-09 DIAGNOSIS — I10 Essential (primary) hypertension: Secondary | ICD-10-CM | POA: Diagnosis not present

## 2014-12-09 DIAGNOSIS — I739 Peripheral vascular disease, unspecified: Secondary | ICD-10-CM | POA: Diagnosis not present

## 2014-12-09 DIAGNOSIS — N529 Male erectile dysfunction, unspecified: Secondary | ICD-10-CM | POA: Insufficient documentation

## 2014-12-09 MED ORDER — SILDENAFIL CITRATE 25 MG PO TABS: 25.0000 mg | ORAL_TABLET | Freq: Every day | ORAL | Status: DC | PRN

## 2014-12-09 NOTE — Patient Instructions (Addendum)
Medication Instructions:  Your physician recommends that you continue on your current medications as directed. Please refer to the Current Medication list given to you today.  Patient given a prescription for Viagra 25mg  take as needed prior to sexual activity.   Labwork: No new orders.   Testing/Procedures: No new orders.   Follow-Up: Your physician wants you to follow-up in: 6 MONTHS with Dr Fletcher Anon.  You will receive a reminder letter in the mail two months in advance. If you don't receive a letter, please call our office to schedule the follow-up appointment.   Any Other Special Instructions Will Be Listed Below (If Applicable).

## 2014-12-09 NOTE — Assessment & Plan Note (Signed)
I prescribed sildenafil 25 mg as needed. I explained side effects to him and also instructed him not to take nitroglycerin on the same day.
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
## 2014-12-09 NOTE — Assessment & Plan Note (Addendum)
The patient has moderate to severe left calf claudication due to an occluded left popliteal artery. His symptoms somewhat improved with walking program. I again discussed the options of proceeding with revascularization including attempted endovascular intervention likely via the retrograde approach versus femoropopliteal bypass. He does not feel as limited at the present time with his claudication and thus we decided to continue with medical therapy. If we decide to do any revascularization in the future, we probably should perform MRA angiography to ensure no popliteal entrapment.

## 2014-12-09 NOTE — Assessment & Plan Note (Signed)
Blood pressure is well controlled on current medications. 

## 2015-01-04 ENCOUNTER — Emergency Department (HOSPITAL_COMMUNITY): Payer: Medicaid Other

## 2015-01-04 ENCOUNTER — Encounter (HOSPITAL_COMMUNITY): Payer: Self-pay | Admitting: Emergency Medicine

## 2015-01-04 ENCOUNTER — Emergency Department (HOSPITAL_COMMUNITY): Payer: Medicaid Other | Admitting: Anesthesiology

## 2015-01-04 ENCOUNTER — Encounter (HOSPITAL_COMMUNITY)
Admission: EM | Disposition: A | Discharge status: Home / Self Care | Payer: Self-pay | Source: Home / Self Care | Attending: Internal Medicine

## 2015-01-04 ENCOUNTER — Inpatient Hospital Stay (HOSPITAL_COMMUNITY): Payer: Medicaid Other

## 2015-01-04 ENCOUNTER — Inpatient Hospital Stay (HOSPITAL_COMMUNITY)
Admission: EM | Admit: 2015-01-04 | Discharge: 2015-01-07 | DRG: 871 | Disposition: A | Discharge status: Home / Self Care | Payer: Medicaid Other | Attending: Internal Medicine | Admitting: Internal Medicine

## 2015-01-04 DIAGNOSIS — Z6835 Body mass index (BMI) 35.0-35.9, adult: Secondary | ICD-10-CM

## 2015-01-04 DIAGNOSIS — F419 Anxiety disorder, unspecified: Secondary | ICD-10-CM | POA: Diagnosis present

## 2015-01-04 DIAGNOSIS — Z960 Presence of urogenital implants: Secondary | ICD-10-CM | POA: Diagnosis not present

## 2015-01-04 DIAGNOSIS — N3281 Overactive bladder: Secondary | ICD-10-CM | POA: Diagnosis present

## 2015-01-04 DIAGNOSIS — Z452 Encounter for adjustment and management of vascular access device: Secondary | ICD-10-CM

## 2015-01-04 DIAGNOSIS — I2510 Atherosclerotic heart disease of native coronary artery without angina pectoris: Secondary | ICD-10-CM | POA: Diagnosis present

## 2015-01-04 DIAGNOSIS — E890 Postprocedural hypothyroidism: Secondary | ICD-10-CM | POA: Diagnosis present

## 2015-01-04 DIAGNOSIS — E785 Hyperlipidemia, unspecified: Secondary | ICD-10-CM | POA: Diagnosis present

## 2015-01-04 DIAGNOSIS — I739 Peripheral vascular disease, unspecified: Secondary | ICD-10-CM | POA: Diagnosis present

## 2015-01-04 DIAGNOSIS — Z79899 Other long term (current) drug therapy: Secondary | ICD-10-CM | POA: Diagnosis not present

## 2015-01-04 DIAGNOSIS — F200 Paranoid schizophrenia: Secondary | ICD-10-CM | POA: Diagnosis not present

## 2015-01-04 DIAGNOSIS — Z923 Personal history of irradiation: Secondary | ICD-10-CM

## 2015-01-04 DIAGNOSIS — N179 Acute kidney failure, unspecified: Secondary | ICD-10-CM

## 2015-01-04 DIAGNOSIS — K219 Gastro-esophageal reflux disease without esophagitis: Secondary | ICD-10-CM | POA: Diagnosis present

## 2015-01-04 DIAGNOSIS — G8929 Other chronic pain: Secondary | ICD-10-CM | POA: Diagnosis present

## 2015-01-04 DIAGNOSIS — I214 Non-ST elevation (NSTEMI) myocardial infarction: Secondary | ICD-10-CM | POA: Diagnosis present

## 2015-01-04 DIAGNOSIS — I959 Hypotension, unspecified: Secondary | ICD-10-CM | POA: Diagnosis present

## 2015-01-04 DIAGNOSIS — B961 Klebsiella pneumoniae [K. pneumoniae] as the cause of diseases classified elsewhere: Secondary | ICD-10-CM | POA: Diagnosis present

## 2015-01-04 DIAGNOSIS — N201 Calculus of ureter: Secondary | ICD-10-CM | POA: Diagnosis present

## 2015-01-04 DIAGNOSIS — A419 Sepsis, unspecified organism: Principal | ICD-10-CM

## 2015-01-04 DIAGNOSIS — I252 Old myocardial infarction: Secondary | ICD-10-CM | POA: Diagnosis not present

## 2015-01-04 DIAGNOSIS — F329 Major depressive disorder, single episode, unspecified: Secondary | ICD-10-CM

## 2015-01-04 DIAGNOSIS — R6521 Severe sepsis with septic shock: Secondary | ICD-10-CM | POA: Diagnosis present

## 2015-01-04 DIAGNOSIS — Z7982 Long term (current) use of aspirin: Secondary | ICD-10-CM | POA: Diagnosis not present

## 2015-01-04 DIAGNOSIS — Z9862 Peripheral vascular angioplasty status: Secondary | ICD-10-CM

## 2015-01-04 DIAGNOSIS — N529 Male erectile dysfunction, unspecified: Secondary | ICD-10-CM

## 2015-01-04 DIAGNOSIS — Z981 Arthrodesis status: Secondary | ICD-10-CM

## 2015-01-04 DIAGNOSIS — Z09 Encounter for follow-up examination after completed treatment for conditions other than malignant neoplasm: Secondary | ICD-10-CM

## 2015-01-04 DIAGNOSIS — E042 Nontoxic multinodular goiter: Secondary | ICD-10-CM | POA: Diagnosis present

## 2015-01-04 DIAGNOSIS — D696 Thrombocytopenia, unspecified: Secondary | ICD-10-CM | POA: Diagnosis present

## 2015-01-04 DIAGNOSIS — N390 Urinary tract infection, site not specified: Secondary | ICD-10-CM

## 2015-01-04 DIAGNOSIS — R000 Tachycardia, unspecified: Secondary | ICD-10-CM | POA: Diagnosis present

## 2015-01-04 DIAGNOSIS — G4700 Insomnia, unspecified: Secondary | ICD-10-CM | POA: Diagnosis present

## 2015-01-04 DIAGNOSIS — B9689 Other specified bacterial agents as the cause of diseases classified elsewhere: Secondary | ICD-10-CM

## 2015-01-04 DIAGNOSIS — I10 Essential (primary) hypertension: Secondary | ICD-10-CM | POA: Diagnosis present

## 2015-01-04 DIAGNOSIS — E872 Acidosis: Secondary | ICD-10-CM | POA: Diagnosis present

## 2015-01-04 DIAGNOSIS — I255 Ischemic cardiomyopathy: Secondary | ICD-10-CM | POA: Diagnosis not present

## 2015-01-04 DIAGNOSIS — IMO0002 Reserved for concepts with insufficient information to code with codable children: Secondary | ICD-10-CM

## 2015-01-04 DIAGNOSIS — N132 Hydronephrosis with renal and ureteral calculous obstruction: Secondary | ICD-10-CM | POA: Diagnosis present

## 2015-01-04 DIAGNOSIS — Z9889 Other specified postprocedural states: Secondary | ICD-10-CM

## 2015-01-04 HISTORY — PX: CYSTOSCOPY W/ URETERAL STENT PLACEMENT: SHX1429

## 2015-01-04 LAB — URINALYSIS, ROUTINE W REFLEX MICROSCOPIC
Bilirubin Urine: NEGATIVE
Glucose, UA: NEGATIVE mg/dL
Ketones, ur: NEGATIVE mg/dL
Nitrite: POSITIVE — AB
Protein, ur: NEGATIVE mg/dL
Specific Gravity, Urine: 1.018 (ref 1.005–1.030)
Urobilinogen, UA: 0.2 mg/dL (ref 0.0–1.0)
pH: 7 (ref 5.0–8.0)

## 2015-01-04 LAB — CBC WITH DIFFERENTIAL/PLATELET
Basophils Absolute: 0 10*3/uL (ref 0.0–0.1)
Basophils Relative: 0 % (ref 0–1)
Eosinophils Absolute: 0 10*3/uL (ref 0.0–0.7)
Eosinophils Relative: 0 % (ref 0–5)
HCT: 44.7 % (ref 39.0–52.0)
Hemoglobin: 14.7 g/dL (ref 13.0–17.0)
Lymphocytes Relative: 9 % — ABNORMAL LOW (ref 12–46)
Lymphs Abs: 0.7 10*3/uL (ref 0.7–4.0)
MCH: 28.3 pg (ref 26.0–34.0)
MCHC: 32.9 g/dL (ref 30.0–36.0)
MCV: 86.1 fL (ref 78.0–100.0)
Monocytes Absolute: 0.1 10*3/uL (ref 0.1–1.0)
Monocytes Relative: 1 % — ABNORMAL LOW (ref 3–12)
Neutro Abs: 7.1 10*3/uL (ref 1.7–7.7)
Neutrophils Relative %: 90 % — ABNORMAL HIGH (ref 43–77)
Platelets: 144 10*3/uL — ABNORMAL LOW (ref 150–400)
RBC: 5.19 MIL/uL (ref 4.22–5.81)
RDW: 13.5 % (ref 11.5–15.5)
WBC: 8 10*3/uL (ref 4.0–10.5)

## 2015-01-04 LAB — BASIC METABOLIC PANEL
Anion gap: 14 (ref 5–15)
BUN: 8 mg/dL (ref 6–20)
CO2: 19 mmol/L — ABNORMAL LOW (ref 22–32)
Calcium: 8.1 mg/dL — ABNORMAL LOW (ref 8.9–10.3)
Chloride: 106 mmol/L (ref 101–111)
Creatinine, Ser: 2.07 mg/dL — ABNORMAL HIGH (ref 0.61–1.24)
GFR calc Af Amer: 42 mL/min — ABNORMAL LOW (ref >60)
GFR calc non Af Amer: 36 mL/min — ABNORMAL LOW (ref >60)
Glucose, Bld: 131 mg/dL — ABNORMAL HIGH (ref 65–99)
Potassium: 3.4 mmol/L — ABNORMAL LOW (ref 3.5–5.1)
Sodium: 139 mmol/L (ref 135–145)

## 2015-01-04 LAB — CBC
HCT: 41.7 % (ref 39.0–52.0)
Hemoglobin: 13.4 g/dL (ref 13.0–17.0)
MCH: 28 pg (ref 26.0–34.0)
MCHC: 32.1 g/dL (ref 30.0–36.0)
MCV: 87.1 fL (ref 78.0–100.0)
Platelets: 88 10*3/uL — ABNORMAL LOW (ref 150–400)
RBC: 4.79 MIL/uL (ref 4.22–5.81)
RDW: 13.8 % (ref 11.5–15.5)
WBC: 4.8 10*3/uL (ref 4.0–10.5)

## 2015-01-04 LAB — MAGNESIUM: Magnesium: 0.9 mg/dL — CL (ref 1.7–2.4)

## 2015-01-04 LAB — I-STAT CHEM 8, ED
BUN: 7 mg/dL (ref 6–20)
Calcium, Ion: 1.07 mmol/L — ABNORMAL LOW (ref 1.12–1.23)
Chloride: 101 mmol/L (ref 101–111)
Creatinine, Ser: 1.4 mg/dL — ABNORMAL HIGH (ref 0.61–1.24)
Glucose, Bld: 136 mg/dL — ABNORMAL HIGH (ref 65–99)
HCT: 49 % (ref 39.0–52.0)
Hemoglobin: 16.7 g/dL (ref 13.0–17.0)
Potassium: 3.8 mmol/L (ref 3.5–5.1)
Sodium: 141 mmol/L (ref 135–145)
TCO2: 22 mmol/L (ref 0–100)

## 2015-01-04 LAB — LACTIC ACID, PLASMA: Lactic Acid, Venous: 5.8 mmol/L (ref 0.5–2.0)

## 2015-01-04 LAB — URINE MICROSCOPIC-ADD ON

## 2015-01-04 LAB — I-STAT CG4 LACTIC ACID, ED
Lactic Acid, Venous: 3.4 mmol/L (ref 0.5–2.0)
Lactic Acid, Venous: 2.5 mmol/L (ref 0.5–2.0)

## 2015-01-04 LAB — TSH: TSH: 1.885 u[IU]/mL (ref 0.350–4.500)

## 2015-01-04 SURGERY — CYSTOSCOPY, WITH RETROGRADE PYELOGRAM AND URETERAL STENT INSERTION
Anesthesia: General | Laterality: Right

## 2015-01-04 MED ORDER — SODIUM CHLORIDE 0.9 % IV BOLUS (SEPSIS)
1000.0000 mL | Freq: Once | INTRAVENOUS | Status: AC
  Administered 2015-01-04: 1000 mL via INTRAVENOUS

## 2015-01-04 MED ORDER — ASPIRIN EC 81 MG PO TBEC
81.0000 mg | DELAYED_RELEASE_TABLET | Freq: Every day | ORAL | Status: DC
  Administered 2015-01-04 – 2015-01-06 (×3): 81 mg via ORAL
  Filled 2015-01-04 (×3): qty 1

## 2015-01-04 MED ORDER — ATORVASTATIN CALCIUM 40 MG PO TABS
40.0000 mg | ORAL_TABLET | Freq: Every day | ORAL | Status: DC
  Administered 2015-01-04 – 2015-01-06 (×3): 40 mg via ORAL
  Filled 2015-01-04 (×4): qty 1

## 2015-01-04 MED ORDER — PROPOFOL 10 MG/ML IV BOLUS
INTRAVENOUS | Status: AC
  Filled 2015-01-04: qty 20

## 2015-01-04 MED ORDER — ROCURONIUM BROMIDE 50 MG/5ML IV SOLN
INTRAVENOUS | Status: AC
  Filled 2015-01-04: qty 2

## 2015-01-04 MED ORDER — ONDANSETRON HCL 4 MG PO TABS
4.0000 mg | ORAL_TABLET | Freq: Four times a day (QID) | ORAL | Status: DC | PRN
  Filled 2015-01-04: qty 1

## 2015-01-04 MED ORDER — ARTIFICIAL TEARS OP OINT
TOPICAL_OINTMENT | OPHTHALMIC | Status: AC
  Filled 2015-01-04: qty 3.5

## 2015-01-04 MED ORDER — HYDROMORPHONE HCL 1 MG/ML IJ SOLN: 0.5000 mg | INTRAMUSCULAR | Status: DC | PRN

## 2015-01-04 MED ORDER — SODIUM CHLORIDE 0.9 % IV SOLN
INTRAVENOUS | Status: AC
  Administered 2015-01-04: 20:00:00 via INTRAVENOUS

## 2015-01-04 MED ORDER — LEVOFLOXACIN IN D5W 500 MG/100ML IV SOLN
500.0000 mg | INTRAVENOUS | Status: DC
  Administered 2015-01-04: 500 mg via INTRAVENOUS
  Filled 2015-01-04 (×2): qty 100

## 2015-01-04 MED ORDER — DEXTROSE 5 % IV SOLN
1.0000 g | Freq: Once | INTRAVENOUS | Status: AC
  Administered 2015-01-04: 1 g via INTRAVENOUS
  Filled 2015-01-04: qty 10

## 2015-01-04 MED ORDER — SUCCINYLCHOLINE CHLORIDE 20 MG/ML IJ SOLN
INTRAMUSCULAR | Status: AC
  Filled 2015-01-04: qty 2

## 2015-01-04 MED ORDER — ETOMIDATE 2 MG/ML IV SOLN
INTRAVENOUS | Status: AC
  Filled 2015-01-04: qty 10

## 2015-01-04 MED ORDER — ACETAMINOPHEN 325 MG PO TABS
ORAL_TABLET | ORAL | Status: AC
  Filled 2015-01-04: qty 2

## 2015-01-04 MED ORDER — SODIUM CHLORIDE 0.9 % IJ SOLN
3.0000 mL | Freq: Two times a day (BID) | INTRAMUSCULAR | Status: DC
  Administered 2015-01-04 – 2015-01-05 (×3): 3 mL via INTRAVENOUS

## 2015-01-04 MED ORDER — HYDROMORPHONE HCL 1 MG/ML IJ SOLN
INTRAMUSCULAR | Status: AC
  Administered 2015-01-04: 0.5 mg via INTRAVENOUS
  Filled 2015-01-04: qty 1

## 2015-01-04 MED ORDER — ENOXAPARIN SODIUM 40 MG/0.4ML ~~LOC~~ SOLN
40.0000 mg | SUBCUTANEOUS | Status: DC
  Administered 2015-01-04: 40 mg via SUBCUTANEOUS
  Filled 2015-01-04 (×2): qty 0.4

## 2015-01-04 MED ORDER — LACTATED RINGERS IV SOLN
INTRAVENOUS | Status: DC | PRN
  Administered 2015-01-04 (×2): via INTRAVENOUS

## 2015-01-04 MED ORDER — SODIUM CHLORIDE 0.9 % IV BOLUS (SEPSIS)
1000.0000 mL | INTRAVENOUS | Status: AC
  Administered 2015-01-04: 1000 mL via INTRAVENOUS

## 2015-01-04 MED ORDER — PROPOFOL 10 MG/ML IV BOLUS
INTRAVENOUS | Status: DC | PRN
  Administered 2015-01-04: 20 mg via INTRAVENOUS
  Administered 2015-01-04: 200 mg via INTRAVENOUS

## 2015-01-04 MED ORDER — MAGNESIUM SULFATE 2 GM/50ML IV SOLN
2.0000 g | Freq: Once | INTRAVENOUS | Status: AC
  Administered 2015-01-04: 2 g via INTRAVENOUS
  Filled 2015-01-04: qty 50

## 2015-01-04 MED ORDER — METOCLOPRAMIDE HCL 5 MG/ML IJ SOLN
10.0000 mg | Freq: Once | INTRAMUSCULAR | Status: AC
  Administered 2015-01-04: 10 mg via INTRAVENOUS
  Filled 2015-01-04: qty 2

## 2015-01-04 MED ORDER — ONDANSETRON HCL 4 MG/2ML IJ SOLN
INTRAMUSCULAR | Status: AC
  Filled 2015-01-04: qty 4

## 2015-01-04 MED ORDER — PHENYLEPHRINE HCL 10 MG/ML IJ SOLN
0.0000 ug/min | INTRAVENOUS | Status: DC
  Administered 2015-01-04: 10 ug/min via INTRAVENOUS
  Filled 2015-01-04 (×2): qty 1

## 2015-01-04 MED ORDER — POTASSIUM CHLORIDE CRYS ER 20 MEQ PO TBCR
40.0000 meq | EXTENDED_RELEASE_TABLET | Freq: Once | ORAL | Status: AC
  Administered 2015-01-04: 40 meq via ORAL
  Filled 2015-01-04: qty 2

## 2015-01-04 MED ORDER — HYDROMORPHONE HCL 1 MG/ML IJ SOLN
0.2500 mg | INTRAMUSCULAR | Status: DC | PRN
  Administered 2015-01-04 (×2): 0.5 mg via INTRAVENOUS

## 2015-01-04 MED ORDER — EPHEDRINE SULFATE 50 MG/ML IJ SOLN
INTRAMUSCULAR | Status: AC
  Filled 2015-01-04: qty 1

## 2015-01-04 MED ORDER — STERILE WATER FOR IRRIGATION IR SOLN
Status: DC | PRN
  Administered 2015-01-04: 1000 mL

## 2015-01-04 MED ORDER — OXYBUTYNIN CHLORIDE 5 MG PO TABS
5.0000 mg | ORAL_TABLET | Freq: Three times a day (TID) | ORAL | Status: DC | PRN
  Administered 2015-01-07: 5 mg via ORAL
  Filled 2015-01-04 (×2): qty 1

## 2015-01-04 MED ORDER — LEVOTHYROXINE SODIUM 100 MCG PO TABS
100.0000 ug | ORAL_TABLET | Freq: Every day | ORAL | Status: DC
  Administered 2015-01-05 – 2015-01-07 (×3): 100 ug via ORAL
  Filled 2015-01-04 (×5): qty 1

## 2015-01-04 MED ORDER — MIDAZOLAM HCL 5 MG/5ML IJ SOLN
INTRAMUSCULAR | Status: DC | PRN
  Administered 2015-01-04 (×2): 1 mg via INTRAVENOUS

## 2015-01-04 MED ORDER — LIDOCAINE HCL (CARDIAC) 20 MG/ML IV SOLN
INTRAVENOUS | Status: AC
  Filled 2015-01-04: qty 10

## 2015-01-04 MED ORDER — ACETAMINOPHEN 650 MG RE SUPP
650.0000 mg | Freq: Four times a day (QID) | RECTAL | Status: DC | PRN
  Filled 2015-01-04: qty 1

## 2015-01-04 MED ORDER — LIDOCAINE HCL (CARDIAC) 20 MG/ML IV SOLN
INTRAVENOUS | Status: DC | PRN
  Administered 2015-01-04: 50 mg via INTRAVENOUS

## 2015-01-04 MED ORDER — PROMETHAZINE HCL 25 MG/ML IJ SOLN: 6.2500 mg | INTRAMUSCULAR | Status: DC | PRN

## 2015-01-04 MED ORDER — METOPROLOL TARTRATE 25 MG PO TABS
25.0000 mg | ORAL_TABLET | Freq: Two times a day (BID) | ORAL | Status: DC
Start: 2015-01-04 — End: 2015-01-04

## 2015-01-04 MED ORDER — ACETAMINOPHEN 325 MG PO TABS
650.0000 mg | ORAL_TABLET | Freq: Four times a day (QID) | ORAL | Status: DC | PRN
  Administered 2015-01-04: 650 mg via ORAL
  Filled 2015-01-04: qty 2

## 2015-01-04 MED ORDER — KETOROLAC TROMETHAMINE 30 MG/ML IJ SOLN
30.0000 mg | Freq: Once | INTRAMUSCULAR | Status: AC
  Administered 2015-01-04: 30 mg via INTRAVENOUS
  Filled 2015-01-04: qty 1

## 2015-01-04 MED ORDER — ESCITALOPRAM OXALATE 20 MG PO TABS
20.0000 mg | ORAL_TABLET | Freq: Every day | ORAL | Status: DC
  Administered 2015-01-05 – 2015-01-07 (×3): 20 mg via ORAL
  Filled 2015-01-04 (×3): qty 1

## 2015-01-04 MED ORDER — SODIUM CHLORIDE 0.9 % IV BOLUS (SEPSIS): 500.0000 mL | INTRAVENOUS | Status: AC

## 2015-01-04 MED ORDER — METOPROLOL TARTRATE 25 MG PO TABS
25.0000 mg | ORAL_TABLET | Freq: Two times a day (BID) | ORAL | Status: DC
  Filled 2015-01-04: qty 1

## 2015-01-04 MED ORDER — CILOSTAZOL 100 MG PO TABS
100.0000 mg | ORAL_TABLET | Freq: Two times a day (BID) | ORAL | Status: DC
  Administered 2015-01-04 – 2015-01-07 (×6): 100 mg via ORAL
  Filled 2015-01-04 (×8): qty 1

## 2015-01-04 MED ORDER — PANTOPRAZOLE SODIUM 40 MG PO TBEC
40.0000 mg | DELAYED_RELEASE_TABLET | Freq: Every day | ORAL | Status: DC
  Administered 2015-01-05 – 2015-01-07 (×3): 40 mg via ORAL
  Filled 2015-01-04 (×3): qty 1

## 2015-01-04 MED ORDER — MIDAZOLAM HCL 2 MG/2ML IJ SOLN
INTRAMUSCULAR | Status: AC
  Filled 2015-01-04: qty 4

## 2015-01-04 MED ORDER — FENTANYL CITRATE (PF) 250 MCG/5ML IJ SOLN
INTRAMUSCULAR | Status: AC
  Filled 2015-01-04: qty 5

## 2015-01-04 MED ORDER — SODIUM CHLORIDE 0.9 % IJ SOLN
INTRAMUSCULAR | Status: AC
  Filled 2015-01-04: qty 10

## 2015-01-04 MED ORDER — ONDANSETRON HCL 4 MG/2ML IJ SOLN
4.0000 mg | Freq: Four times a day (QID) | INTRAMUSCULAR | Status: DC | PRN
  Filled 2015-01-04: qty 2

## 2015-01-04 MED ORDER — FENTANYL CITRATE (PF) 250 MCG/5ML IJ SOLN
INTRAMUSCULAR | Status: DC | PRN
  Administered 2015-01-04 (×2): 50 ug via INTRAVENOUS

## 2015-01-04 MED ORDER — METOPROLOL TARTRATE 25 MG PO TABS
25.0000 mg | ORAL_TABLET | Freq: Two times a day (BID) | ORAL | Status: DC
Start: 2015-01-05 — End: 2015-01-04

## 2015-01-04 MED ORDER — SUCCINYLCHOLINE CHLORIDE 20 MG/ML IJ SOLN
INTRAMUSCULAR | Status: DC | PRN
  Administered 2015-01-04: 140 mg via INTRAVENOUS

## 2015-01-04 SURGICAL SUPPLY — 32 items
ADAPTER CATH URET PLST 4-6FR (CATHETERS) IMPLANT
ADPR CATH URET STRL DISP 4-6FR (CATHETERS)
APL SKNCLS STERI-STRIP NONHPOA (GAUZE/BANDAGES/DRESSINGS)
BAG URINE DRAINAGE (UROLOGICAL SUPPLIES) ×1 IMPLANT
BAG URO CATCHER STRL LF (DRAPE) ×3 IMPLANT
BENZOIN TINCTURE PRP APPL 2/3 (GAUZE/BANDAGES/DRESSINGS) IMPLANT
BLADE 10 SAFETY STRL DISP (BLADE) ×1 IMPLANT
BUCKET BIOHAZARD WASTE 5 GAL (MISCELLANEOUS) ×3 IMPLANT
CATH FOLEY 2WAY SLVR  5CC 16FR (CATHETERS)
CATH FOLEY 2WAY SLVR 5CC 16FR (CATHETERS) IMPLANT
CATH URET 5FR 28IN CONE TIP (BALLOONS)
CATH URET 5FR 28IN OPEN ENDED (CATHETERS) ×2 IMPLANT
CATH URET 5FR 70CM CONE TIP (BALLOONS) IMPLANT
DRAPE CAMERA CLOSED 9X96 (DRAPES) ×6 IMPLANT
GOWN STRL REUS W/ TWL LRG LVL3 (GOWN DISPOSABLE) ×1 IMPLANT
GOWN STRL REUS W/ TWL XL LVL3 (GOWN DISPOSABLE) ×1 IMPLANT
GOWN STRL REUS W/TWL LRG LVL3 (GOWN DISPOSABLE) ×3
GOWN STRL REUS W/TWL XL LVL3 (GOWN DISPOSABLE) ×3
GUIDEWIRE COOK  .035 (WIRE) IMPLANT
GUIDEWIRE STR DUAL SENSOR (WIRE) ×2 IMPLANT
KIT ROOM TURNOVER OR (KITS) ×3 IMPLANT
NS IRRIG 1000ML POUR BTL (IV SOLUTION) ×2 IMPLANT
PACK CYSTOSCOPY (CUSTOM PROCEDURE TRAY) ×3 IMPLANT
PAD ARMBOARD 7.5X6 YLW CONV (MISCELLANEOUS) ×6 IMPLANT
PLUG CATH AND CAP STER (CATHETERS) IMPLANT
STENT URET 6FRX24 CONTOUR (STENTS) ×2 IMPLANT
SYRINGE CONTROL L 12CC (SYRINGE) IMPLANT
SYRINGE CONTROL LL 12CC (SYRINGE) ×1 IMPLANT
SYRINGE TOOMEY DISP (SYRINGE) IMPLANT
UNDERPAD 30X30 INCONTINENT (UNDERPADS AND DIAPERS) ×3 IMPLANT
WATER STERILE IRR 1000ML POUR (IV SOLUTION) ×1 IMPLANT
WIRE COONS/BENSON .038X145CM (WIRE) IMPLANT

## 2015-01-04 NOTE — Progress Notes (Addendum)
Patient s/p cystoscopy w/ stent placement for infected ureteral stone. Repeat lactic acid 5.8. BP 90's/50's, febrile, tachycardic. Patient seen at bedside in the PACU @ ~9:30 PM, patient has very few complaints. Looks clinically improved from when he was seen previously in the PACU. Given NS bolus x1L (total of 3L), started on Levaquin (previously given Rocephin 1 g), and going to stepdown (3S). Discussed w/ PCCM given significant lactic acidosis, will continue to monitor closely. Goal MAP >65. If unable to maintain this, will formally consult PCCM for pressor support.   Natasha Bence, MD PGY-3, Internal Medicine Pager: [phone]   ADDENDUM: 10:15 PM Patient arrived in 3S, SBP now in the 80's (MAP 57) per RN, still tachy in the 120's. Will give another 1L NS bolus.   ADDENDUM: 11:30 PM Patient now being started on 5th L of NS (total), BP still 63-81 systolic, confirmed by doppler per RN. Patient now w/ some mild dizziness. Discussed w/ Dr. Oletta Darter, will transfer patient to ICU, start peripheral Neosynephrine for now.  Natasha Bence, MD PGY-3, Internal Medicine Pager: [phone]

## 2015-01-04 NOTE — Progress Notes (Signed)
Hospitalist @ bedside to assess patient. Waiting for new orders.

## 2015-01-04 NOTE — ED Notes (Signed)
Pt reports right flank pain as testicle pain since 0600 this morning.  Has urinated twice since pain began.  Has history of kidney stones.

## 2015-01-04 NOTE — ED Notes (Signed)
Patient transported to CT 

## 2015-01-04 NOTE — Progress Notes (Signed)
No post-op or admission orders. Dr. Diona Fanti paged. Waiting for return call.

## 2015-01-04 NOTE — H&P (Signed)
Urology History and Physical Exam  CC: Kidney stone, hypotension  HPI: 49 year old male presented to the emergency room here at [facility] for evaluation and management of right flank pain of one-day duration. He has had intermittent gross painless hematuria but has had intermittent right flank discomfort which was not severe for the past 2-3 weeks. He has had no fever, but had chills today. He does have a history of passing several stones in the past, none necessitating urologic intervention. He has been treated for at least one urinary tract infection. The patient was evaluated and found to have a 3 mm right ureterovesical junction stone, but infected appearing urine. While in the emergency room he became somewhat hypotensive and tachycardic. Urologic consultation is requested.  PMH: Past Medical History  Diagnosis Date  . HYPERTENSION 03/16/2010  . HYPERTHYROIDISM 03/16/2010    s/p RAI  . THYROID STORM 03/16/2010  . Altered mental status 03/16/2010  . Hypothyroidism following radioiodine therapy   . NSTEMI (non-ST elevated myocardial infarction)     05/2012:  LHC with normal EF and no CAD (? spasm)  . GERD (gastroesophageal reflux disease)   . Arthritis   . PAD (peripheral artery disease)     a.  ABI (01/2014):  L 0.53; R 1.0 >> referred to [facility]  . High cholesterol   . Chronic back pain     "from the neck to the lower back" (05/28/2014)    PSH: Past Surgical History  Procedure Laterality Date  . Back surgery    . Anterior cervical decomp/discectomy fusion  08/2010  . Shoulder arthroscopy Right 2000    debridement,  . Posterior cervical fusion/foraminotomy N/A 12/10/2012    Procedure: POSTERIOR CERVICAL FUSION/FORAMINOTOMY LEVEL 1;  Surgeon: Ophelia Charter, MD;  Location: [place] [facility]UR[facility];  Service: Neurosurgery;  Laterality: N/A;  Cervical three-four posterior cervical fusion with lateral mass screws  . Left heart catheterization with coronary angiogram N/A 06/12/2012   Procedure: LEFT HEART CATHETERIZATION WITH CORONARY ANGIOGRAM;  Surgeon: Josue Hector, MD;  Location: [facility] CATH LAB;  Service: Cardiovascular;  Laterality: N/A;  . Abdominal aortagram N/A 02/19/2014    Procedure: ABDOMINAL Maxcine Ham;  Surgeon: Wellington Hampshire, MD;  Location: [place] CATH LAB;  Service: Cardiovascular;  Laterality: N/A;  . Popliteal artery angioplasty Left 05/28/2014    unsuccessful/notes 05/28/2014  . Umbilical hernia repair  1980  . Hernia repair  ~ 1980    UHR  . Lumbar disc surgery  03/2009    "cut out 1/2 the disc"  . Posterior lumbar fusion  10/2009; 07/2013  . Shoulder arthroscopy w/ rotator cuff repair Right 2004  . Lower extremity angiogram Left 05/28/2014    Procedure: LOWER EXTREMITY ANGIOGRAM;  Surgeon: Wellington Hampshire, MD;  Location: [place] CATH LAB;  Service: Cardiovascular;  Laterality: Left;  Failed PTA CTO    Allergies: Allergies  Allergen Reactions  . Vicodin [Hydrocodone-Acetaminophen] Hives    Medications:  (Not in a hospital admission)   Social History: Social History   Social History  . Marital Status: Married    Spouse Name: N/A  . Number of Children: N/A  . Years of Education: N/A   Occupational History  . Disabled    Social History Main Topics  . Smoking status: Never Smoker   . Smokeless tobacco: Never Used  . Alcohol Use: 0.0 oz/week     Comment: 05/28/2014 "might have a few beers a couple times/month"  . Drug Use: No  . Sexual Activity:  Yes   Other Topics Concern  . Not on file   Social History Narrative   Regular exercise-no   Disabled to due back problem    Family History: Family History  Problem Relation Age of Onset  . Thyroid disease Mother     hypothyroidism  . Heart attack Maternal Grandfather   . Heart Problems Father     pacermaker  . Edema Father   . Heart disease Maternal Grandmother   . Cancer Maternal Grandmother   . Diabetes Maternal Grandmother   . Hypertension Maternal Grandmother     Review of  Systems: Positive: Intermittent gross hematuria, flank pain, chills. Negative: No significant fever, no chest pain.  A further 10 point review of systems was negative except what is listed in the HPI.                  Physical Exam: @VITALS2 @ General: No acute distress.  Awake. Head:  Normocephalic.  Atraumatic. ENT:  EOMI.  Mucous membranes moist Neck:  Supple.  No lymphadenopathy. CV:  S1 present. S2 present. Increased rate Pulmonary: Equal effort bilaterally.  Clear to auscultation bilaterally. Abdomen: Soft.  Mild right lower quadrant and right CVA tenderness. Skin:  Normal turgor.  No visible rash. Extremity: No gross deformity of bilateral upper extremities.  No gross deformity of lower extremities. Neurologic: Alert. Appropriate mood.  Penis:  circumcised.  No lesions. Urethra: Orthotopic meatus. Scrotum: No lesions.  No ecchymosis.  No erythema. Testicles: Descended bilaterally.  No masses bilaterally. Epididymis: Palpable bilaterally. Nontender to palpation.  Studies:  Recent Labs     01/04/15  1327  01/04/15  1334  HGB  14.7  16.7  WBC  8.0   --   PLT  144*   --     Recent Labs     01/04/15  1334  NA  141  K  3.8  CL  101  BUN  7  CREATININE  1.40*     No results for input(s): INR, APTT in the last 72 hours.  Invalid input(s): PT   Invalid input(s): ABG  I reviewed the patient's CT images. I see no remaining renal calculi. Right hydronephrosis, mild noted. Right UVJ stone noted. Lumbar hardware present.  Assessment:  Right distal ureteral stone with urinary tract infection, hypotension and tachycardia  Plan: I've discussed the plan with the patient and his wife, who is in attendance today. Because of his infection, tachycardia, hypotension, I have recommended emergent cystoscopy and stent placement. If the stone is readily visible cystoscopically, I will attempt basket, but otherwise will just place a stent, admit for antibiotic management as well as  pressure support.

## 2015-01-04 NOTE — Progress Notes (Signed)
Patient transferred from PACU on tele by PACU RN. BP remains low, HR elevated, febrile. Dr. Ronnald Ramp notified, orders for second NS 1L bolus given. Patient oriented to unit and room, instructed on callbell and placed at side. Wife at bedside. Belongings at bedside.

## 2015-01-04 NOTE — Op Note (Signed)
Preoperative diagnosis: Infected, obstructing right distal ureteral stone  Postoperative diagnosis: Same   Procedure: Cystoscopy, placement of double-J stent and right ureter (6 [place] by 24 cm with string)    Surgeon: Lillette Boxer. Shanele Nissan, M.D.   Anesthesia: Gen.   Complications: None  Specimen(s): None  Drain(s): None  Indications: 49 year old male who presented to the emergency room today with a right distal ureteral stone, symptomatic for a few weeks, as well as hypotension, tachycardia and a urinary tract infection. He is taken urgently to the operating room for decompression with a double-J stent using cystoscopic guidance.    Technique and findings: The patient received Rocephin in the emergency room. The surgical side was properly marked and consent was signed. The patient was properly identified in the holding area and was taken the operating room where general endotracheal anesthetic was administered. He was placed in the dorsolithotomy position. Genitalia and perineum were prepped and draped. Proper timeout was performed.  A 22 French panendoscope was advanced through his urethra under direct vision. The urethra was normal, prostate was nonobstructive. The bladder was entered and inspected circumferentially. There were no tumors trabeculations or foreign bodies. Urine was slightly bloody, and purulent/bloody urine was seen coming from the right ureteral orifice. This was then cannulated with a 6 [place] open-ended catheter and a sensor-tip guidewire was advanced fluoroscopically easily up to the right renal pelvis where good curl was seen in the upper pole calyces. The open-ended catheter was removed. I then placed a 24 cm x 6 French contour double-J stent over top of the guidewire using cystoscopic and fluoroscopic guidance. The pusher was used to advance the stent in adequate position, and at this point the guidewire was removed. Excellent proximal and distal curls were seen using  fluoroscopic and cystoscopic identification. The bladder was drained and the scope removed. The patient was awakened and taken to the PACU in stable condition. He tolerated the procedure well.

## 2015-01-04 NOTE — Progress Notes (Signed)
D; report given to 2W RN, on the way to tx, charge nurse called that pt needs higher level of care. Called MD, step down bed order received. Await 3S RN call.

## 2015-01-04 NOTE — Anesthesia Preprocedure Evaluation (Addendum)
Anesthesia Evaluation  Patient identified by MRN, date of birth, ID band Patient awake    Reviewed: Allergy & Precautions, H&P , NPO status , Patient's Chart, lab work & pertinent test results, reviewed documented beta blocker date and time   Airway Mallampati: III  TM Distance: >3 FB Neck ROM: full   Comment: Previous neck fusion Dental  (+) Teeth Intact, Dental Advisory Given   Pulmonary neg pulmonary ROS,  breath sounds clear to auscultation        Cardiovascular hypertension, On Medications and On Home Beta Blockers + Past MI and + Peripheral Vascular Disease Rhythm:regular Rate:Normal     Neuro/Psych negative neurological ROS  negative psych ROS   GI/Hepatic Neg liver ROS, GERD-  Medicated and Controlled,  Endo/Other  Hypothyroidism Hyperthyroidism   Renal/GU      Musculoskeletal   Abdominal (+) + obese,   Peds  Hematology negative hematology ROS (+)   Anesthesia Other Findings See surgeon's H&P   Reproductive/Obstetrics negative OB ROS                           Anesthesia Physical Anesthesia Plan  ASA: III and emergent  Anesthesia Plan: General   Post-op Pain Management:    Induction: Intravenous  Airway Management Planned: Oral ETT  Additional Equipment:   Intra-op Plan:   Post-operative Plan: Extubation in OR  Informed Consent: I have reviewed the patients History and Physical, chart, labs and discussed the procedure including the risks, benefits and alternatives for the proposed anesthesia with the patient or authorized representative who has indicated his/her understanding and acceptance.   Dental advisory given  Plan Discussed with: CRNA, Surgeon and Anesthesiologist  Anesthesia Plan Comments:       Anesthesia Quick Evaluation

## 2015-01-04 NOTE — Anesthesia Procedure Notes (Signed)
Procedure Name: Intubation Date/Time: 01/04/2015 5:14 PM Performed by: Maude Leriche D Pre-anesthesia Checklist: Patient identified, Emergency Drugs available, Suction available, Patient being monitored and Timeout performed Patient Re-evaluated:Patient Re-evaluated prior to inductionOxygen Delivery Method: Circle system utilized Preoxygenation: Pre-oxygenation with 100% oxygen Intubation Type: IV induction Ventilation: Mask ventilation without difficulty Laryngoscope Size: Glidescope (Elective Glidescope) Grade View: Grade I Tube type: Oral Tube size: 7.5 mm Number of attempts: 1 Airway Equipment and Method: Stylet and Video-laryngoscopy Placement Confirmation: ETT inserted through vocal cords under direct vision,  breath sounds checked- equal and bilateral and positive ETCO2 Secured at: 23 cm Tube secured with: Tape Dental Injury: Teeth and Oropharynx as per pre-operative assessment

## 2015-01-04 NOTE — H&P (Signed)
Date: 01/04/2015               Patient Name:  Jeff [name] MRN: [id number]  DOB: [birth year] Age / Sex: 49 y.o., male   PCP: Lillard Anes, MD         Medical Service: Internal Medicine Teaching Service         Attending Physician: Dr. Larey Dresser    First Contact: Dr. Liberty Handy Pager: [phone]  Second Contact: Dr. Duwaine Maxin Pager: [phone]       After Hours (After 5p/  First Contact Pager: [phone]  weekends / holidays): Second Contact Pager: [phone]   Chief Complaint: Flank Pain   History of Present Illness: Mr. Sarver is 49 year old man with a past medical history of HTN, nephrolithiasis, and hypothyroidism s/p RAI who presented with right sided flank pain. It started this morning after he woke up, and he recognized it immediately as the pain he felt with kidney stones before. He describes it as right flank pain that radiates into his groin and it is associated with nausea and vomiting. He had a bowel movement today that was normal. He reports no fever, but has had some chills today. Over the past week, he has endorsed some hematuria. He denied any chest pain, shortness of breath,  rectal pain, other abdominal pain or numbness or weakness.  In the ED, he was noted that his urine appeared "infected." to be tachycardic to the 130s, hypotensive to 87/42. His temperature on admission was 97.6. UA showed pyuria that was nitrite positive, and creatinine elevated 1.4 from baseline of 1.2. Lactic acid was 3.4. There was no leukocytosis. Blood and urine cultures were sent, and he was given a dose of IV ceftriaxone. In the setting of his infection, he was emergently taken to the OR by urology for stone removal and R ureteral stent placement. Dopplers of the testes were normal.    Meds: Current Facility-Administered Medications  Medication Dose Route Frequency Provider Last Rate Last Dose  . 0.9 %  sodium chloride infusion   Intravenous Continuous Corky Sox, MD 125 mL/hr  at 01/04/15 1948    . acetaminophen (TYLENOL) 325 MG tablet           . acetaminophen (TYLENOL) tablet 650 mg  650 mg Oral Q6H PRN Corky Sox, MD   650 mg at 01/04/15 1940   Or  . acetaminophen (TYLENOL) suppository 650 mg  650 mg Rectal Q6H PRN Corky Sox, MD      . aspirin EC tablet 81 mg  81 mg Oral Daily Corky Sox, MD      . Derrill Memo ON 01/05/2015] atorvastatin (LIPITOR) tablet 40 mg  40 mg Oral QHS Corky Sox, MD      . cilostazol (PLETAL) tablet 100 mg  100 mg Oral BID Corky Sox, MD      . enoxaparin (LOVENOX) injection 40 mg  40 mg Subcutaneous Q24H Corky Sox, MD      . Derrill Memo ON 01/05/2015] escitalopram (LEXAPRO) tablet 20 mg  20 mg Oral Daily Corky Sox, MD      . HYDROmorphone (DILAUDID) injection 0.25-0.5 mg  0.25-0.5 mg Intravenous Q5 min PRN Rica Koyanagi, MD   0.5 mg at 01/04/15 1830  . HYDROmorphone (DILAUDID) injection 0.5-1 mg  0.5-1 mg Intravenous Q3H PRN Corky Sox, MD      . levofloxacin (LEVAQUIN) IVPB 500 mg  500 mg Intravenous Q24H Karren Cobble,  RPH   500 mg at 01/04/15 2007  . [START ON 01/05/2015] levothyroxine (SYNTHROID, LEVOTHROID) tablet 100 mcg  100 mcg Oral QAC breakfast Corky Sox, MD      . Derrill Memo ON 01/05/2015] metoprolol tartrate (LOPRESSOR) tablet 25 mg  25 mg Oral BID Corky Sox, MD      . ondansetron [facility]) tablet 4 mg  4 mg Oral Q6H PRN Corky Sox, MD       Or  . ondansetron [facility]) injection 4 mg  4 mg Intravenous Q6H PRN Corky Sox, MD      . oxybutynin (DITROPAN) tablet 5 mg  5 mg Oral Q8H PRN Franchot Gallo, MD      . Derrill Memo ON 01/05/2015] pantoprazole (PROTONIX) EC tablet 40 mg  40 mg Oral Daily Corky Sox, MD      . promethazine (PHENERGAN) injection 6.25-12.5 mg  6.25-12.5 mg Intravenous Q15 min PRN Rica Koyanagi, MD      . sodium chloride 0.9 % injection 3 mL  3 mL Intravenous Q12H Corky Sox, MD        Allergies: Allergies as of 01/04/2015 - Review Complete 01/04/2015  Allergen Reaction Noted  . Vicodin  [hydrocodone-acetaminophen] Hives 02/18/2014   Past Medical History  Diagnosis Date  . HYPERTENSION 03/16/2010  . HYPERTHYROIDISM 03/16/2010    s/p RAI  . THYROID STORM 03/16/2010  . Altered mental status 03/16/2010  . Hypothyroidism following radioiodine therapy   . NSTEMI (non-ST elevated myocardial infarction)     05/2012:  LHC with normal EF and no CAD (? spasm)  . GERD (gastroesophageal reflux disease)   . Arthritis   . PAD (peripheral artery disease)     a.  ABI (01/2014):  L 0.53; R 1.0 >> referred to PV  . High cholesterol   . Chronic back pain     "from the neck to the lower back" (05/28/2014)   Past Surgical History  Procedure Laterality Date  . Back surgery    . Anterior cervical decomp/discectomy fusion  08/2010  . Shoulder arthroscopy Right 2000    debridement,  . Posterior cervical fusion/foraminotomy N/A 12/10/2012    Procedure: POSTERIOR CERVICAL FUSION/FORAMINOTOMY LEVEL 1;  Surgeon: Ophelia Charter, MD;  Location: [place] [facility]URO ORS;  Service: Neurosurgery;  Laterality: N/A;  Cervical three-four posterior cervical fusion with lateral mass screws  . Left heart catheterization with coronary angiogram N/A 06/12/2012    Procedure: LEFT HEART CATHETERIZATION WITH CORONARY ANGIOGRAM;  Surgeon: Josue Hector, MD;  Location: [facility] CATH LAB;  Service: Cardiovascular;  Laterality: N/A;  . Abdominal aortagram N/A 02/19/2014    Procedure: ABDOMINAL Maxcine Ham;  Surgeon: Wellington Hampshire, MD;  Location: [place] CATH LAB;  Service: Cardiovascular;  Laterality: N/A;  . Popliteal artery angioplasty Left 05/28/2014    unsuccessful/notes 05/28/2014  . Umbilical hernia repair  1980  . Hernia repair  ~ 1980    UHR  . Lumbar disc surgery  03/2009    "cut out 1/2 the disc"  . Posterior lumbar fusion  10/2009; 07/2013  . Shoulder arthroscopy w/ rotator cuff repair Right 2004  . Lower extremity angiogram Left 05/28/2014    Procedure: LOWER EXTREMITY ANGIOGRAM;  Surgeon: Wellington Hampshire, MD;  Location: [place]  CATH LAB;  Service: Cardiovascular;  Laterality: Left;  Failed PTA CTO   Family History  Problem Relation Age of Onset  . Thyroid disease Mother     hypothyroidism  . Heart attack Maternal Grandfather   . Heart  Problems Father     pacermaker  . Edema Father   . Heart disease Maternal Grandmother   . Cancer Maternal Grandmother   . Diabetes Maternal Grandmother   . Hypertension Maternal Grandmother    Social History   Social History  . Marital Status: Married    Spouse Name: N/A  . Number of Children: N/A  . Years of Education: N/A   Occupational History  . Disabled    Social History Main Topics  . Smoking status: Never Smoker   . Smokeless tobacco: Never Used  . Alcohol Use: 0.0 oz/week     Comment: 05/28/2014 "might have a few beers a couple times/month"  . Drug Use: No  . Sexual Activity: Yes   Other Topics Concern  . Not on file   Social History Narrative   Regular exercise-no   Disabled to due back problem    Review of Systems: Review of systems not obtained due to patient factors.  Physical Exam: Blood pressure 106/61, pulse 149, temperature 103.2 F (39.6 C), temperature source Rectal, resp. rate 37, height 5\' 10"  (1.778 m), weight 246 lb (111.585 kg), SpO2 97 %.  Physical Exam  Constitutional: He appears well-developed and well-nourished.  Somnolent but arousable when seen in the PACU  HENT:  Head: Normocephalic and atraumatic.  Cardiovascular: Regular rhythm and intact distal pulses.   Rate is tachycardic on exam  Pulmonary/Chest: Breath sounds normal.  Respiratory rate tachypneic  Abdominal: Soft. Bowel sounds are normal. There is tenderness. There is no rebound and no guarding.  Skin: Skin is warm and dry.  Psychiatric:  Patient is still recovering from surgery.  He was somnolent but was easily arousable on exam, but would doze off easily     Lab results: Basic Metabolic Panel:  Recent Labs  01/04/15 1334  NA 141  K 3.8  CL 101    GLUCOSE 136*  BUN 7  CREATININE 1.40*   CBC:  Recent Labs  01/04/15 1327 01/04/15 1334  WBC 8.0  --   NEUTROABS 7.1  --   HGB 14.7 16.7  HCT 44.7 49.0  MCV 86.1  --   PLT 144*  --    Urine Drug Screen: Drugs of Abuse     Component Value Date/Time   LABOPIA NONE DETECTED 06/12/2012 0646   COCAINSCRNUR NONE DETECTED 06/12/2012 0646   LABBENZ POSITIVE* 06/12/2012 0646   AMPHETMU NONE DETECTED 06/12/2012 0646   THCU NONE DETECTED 06/12/2012 0646   LABBARB NONE DETECTED 06/12/2012 0646    Alcohol Level: No results for input(s): ETH in the last 72 hours. Urinalysis:  Recent Labs  01/04/15 1200  COLORURINE YELLOW  LABSPEC 1.018  PHURINE 7.0  GLUCOSEU NEGATIVE  HGBUR LARGE*  BILIRUBINUR NEGATIVE  KETONESUR NEGATIVE  PROTEINUR NEGATIVE  UROBILINOGEN 0.2  NITRITE POSITIVE*  LEUKOCYTESUR MODERATE*    Imaging results:  US Scrotum  01/04/2015   CLINICAL DATA:  49 year old with acute onset of right scrotal pain which began at 0600 hr earlier today.  EXAM: SCROTAL ULTRASOUND  DOPPLER ULTRASOUND OF THE TESTICLES  TECHNIQUE: Complete ultrasound examination of the testicles, epididymis, and other scrotal structures was performed. Color and spectral Doppler ultrasound were also utilized to evaluate blood flow to the testicles.  COMPARISON:  CT abdomen and pelvis earlier same date and 05/28/2014. A left scrotal hematoma was present on the prior CT 05/28/2014  FINDINGS: Right testicle  Measurements: Approximately 3.9 x 2.4 x 2.7 cm. Normal parenchymal echotexture without mass or microlithiasis. Normal color  Doppler flow without evidence of hyperemia.  Left testicle  Measurements: Approximately 3.6 x 2.2 x 3.0 cm. Normal parenchymal echotexture without mass or microlithiasis. Normal color Doppler flow without evidence of hyperemia.  Right epididymis: Normal in size and appearance without evidence of hyperemia.  Left epididymis: Normal in size and appearance without evidence of  hyperemia.  Hydrocele:  None visualized.  Varicocele: Moderate-sized left varicocele. No evidence of right varicocele.  Pulsed Doppler interrogation of both testes demonstrates normal low resistance arterial and venous waveforms bilaterally.  IMPRESSION: 1. Normal-appearing bilateral testes and epididymides. Specifically, no evidence of right testicular torsion or epididymo-orchitis. 2. Moderate-sized left varicocele.   Electronically Signed   By: Evangeline Dakin M.D.   On: 01/04/2015 13:16   [place] Art/ven Flow Abd Pelv Doppler  01/04/2015   CLINICAL DATA:  49 year old with acute onset of right scrotal pain which began at 0600 hr earlier today.  EXAM: SCROTAL ULTRASOUND  DOPPLER ULTRASOUND OF THE TESTICLES  TECHNIQUE: Complete ultrasound examination of the testicles, epididymis, and other scrotal structures was performed. Color and spectral Doppler ultrasound were also utilized to evaluate blood flow to the testicles.  COMPARISON:  CT abdomen and pelvis earlier same date and 05/28/2014. A left scrotal hematoma was present on the prior CT 05/28/2014  FINDINGS: Right testicle  Measurements: Approximately 3.9 x 2.4 x 2.7 cm. Normal parenchymal echotexture without mass or microlithiasis. Normal color Doppler flow without evidence of hyperemia.  Left testicle  Measurements: Approximately 3.6 x 2.2 x 3.0 cm. Normal parenchymal echotexture without mass or microlithiasis. Normal color Doppler flow without evidence of hyperemia.  Right epididymis: Normal in size and appearance without evidence of hyperemia.  Left epididymis: Normal in size and appearance without evidence of hyperemia.  Hydrocele:  None visualized.  Varicocele: Moderate-sized left varicocele. No evidence of right varicocele.  Pulsed Doppler interrogation of both testes demonstrates normal low resistance arterial and venous waveforms bilaterally.  IMPRESSION: 1. Normal-appearing bilateral testes and epididymides. Specifically, no evidence of right testicular  torsion or epididymo-orchitis. 2. Moderate-sized left varicocele.   Electronically Signed   By: Evangeline Dakin M.D.   On: 01/04/2015 13:16   Ct Renal Stone Study  01/04/2015   CLINICAL DATA:  Testicular pain. RIGHT flank pain. Onset of symptoms at 0600 hr.  EXAM: CT ABDOMEN AND PELVIS WITHOUT CONTRAST  TECHNIQUE: Multidetector CT imaging of the abdomen and pelvis was performed following the standard protocol without IV contrast.  COMPARISON:  05/28/2014.  FINDINGS: Musculoskeletal: L4 through S1 PLIF. No aggressive osseous lesions. Thoracolumbar vertebral body height is preserved.  Lung Bases: Dependent atelectasis.  Extrapleural fat on the LEFT.  Liver: Unenhanced CT was performed per clinician order. Lack of IV contrast limits sensitivity and specificity, especially for evaluation of abdominal/pelvic solid viscera.  Spleen:  Normal.  Gallbladder:  Normal.  Common bile duct:  Normal.  Pancreas:  Normal.  Adrenal glands:  Normal bilaterally.  Kidneys: LEFT kidney and ureter appear normal. No obstruction or inflammatory change.  The RIGHT kidney shows mild perinephric stranding. Minimal ectasia of the calices. Mild ectasia of the RIGHT ureter with proximal perinephric inflammatory changes. 2 mm calculus is present at the RIGHT UVJ.  Stomach:  Small hiatal hernia.  Otherwise normal.  Small bowel:  Normal.  No mesenteric adenopathy.  Colon: Normal appendix. No inflammatory changes of colon. Transverse and descending colon collapsed.  Pelvic Genitourinary: Urinary bladder is mostly collapsed. Probable bilateral varicoceles.  Peritoneum: No free fluid or free air.  Vascular/lymphatic: Mild atherosclerosis.  Body Wall: Normal.  IMPRESSION: 1. 2 mm RIGHT UVJ stone.  No significant hydronephrosis. 2. No residual collecting system calculi.   Electronically Signed   By: Dereck Ligas M.D.   On: 01/04/2015 12:54     Assessment & Plan by Problem: Active Problems:   Ureterolithiasis  Urinary Tract Infection in the  Setting of Nephrolithiasis:  S/p right ureteral stent placement on 8/21 for 2-3 mm stone at ureterovesicular junction. Given his fever to 103, tachycardia, hypotension, and UA with pyuria and positive nitrites, he also has a concurrent UTI.  - Levaquin per pharmacy consult - Dilaudid IV prn for pain control -Oxybutynin prn for bladder spasms -Tylenol prn for fever.  Suspect tachycardia is related to fever as pain denies pain during our exam.  Will also check EKG - Urine and blood cultures pending -repeat CBC now and in the AM  AKI: creatine mildly elevated to 1.4 from baseline of 1.2.  Lactic acid on admission of 3.4-->2.5 -NS IV fluids at 129ml/hr x 12 hours -repeat lactic acid, BMP now  -Repeat BMP in AM -trend creatine  Peripheral Artery Disease:  Occluded left popliteal artery - Continue cilostazol  HTN: Hold home amlodipine and Lopressor this evening in setting of hypotension -restart Lopressor tomorrow (Monday)  HLD:  Continue artorvastatin and aspirin 81mg   Hypothyroidism s/p RAI: Continue home levothyroxine 100 mcg daily  Depression vs. Anxiety:  Lexapro 20mg  daily -hold clonazepam and ambien for now  GERD: On Prilosec at home -continue Protonix daily  ED:  Hold sildenafil   Diet: Regular  DVT PPX: Lovenox  Code: Full  Dispo: Disposition is deferred at this time, awaiting improvement of current medical problems. Anticipated discharge in approximately 2-3 day(s).   The patient does have a current PCP Lillard Anes, MD) and does not need an [facility] hospital follow-up appointment after discharge.  The patient does not have transportation limitations that hinder transportation to clinic appointments.  Signed: Jule Ser, DO 01/04/2015, 8:23 PM

## 2015-01-04 NOTE — ED Provider Notes (Signed)
CSN: [id number]     Arrival date & time 01/04/15  1109 History   First MD Initiated Contact with Patient 01/04/15 1118     Chief Complaint  Patient presents with  . Flank Pain     (Consider location/radiation/quality/duration/timing/severity/associated sxs/prior Treatment) HPI Jeff [name] is a 49 y.o. male with history of hypertension, nephrolithiasis, hyperthyroid, comes in for evaluation of right-sided flank pain. Patient states he is very sudden onset right-sided flank pain this morning at 6:00 AM similar to previous pain he has had with kidney stones. He reports associated nausea and vomiting. He reports radiation from his right flank into his right groin. He rates his discomfort as "beyond 10". Reports he was able to p.m. and have a bowel movement this morning that were both normal for him. He does report some blood in his urine over the past week. Denies any fevers, chills, rectal pain, other abdominal pain, chest pain, service of breath, numbness or weakness. No other aggravating or modifying factors.  Past Medical History  Diagnosis Date  . HYPERTENSION 03/16/2010  . HYPERTHYROIDISM 03/16/2010    s/p RAI  . THYROID STORM 03/16/2010  . Altered mental status 03/16/2010  . Hypothyroidism following radioiodine therapy   . NSTEMI (non-ST elevated myocardial infarction)     05/2012:  LHC with normal EF and no CAD (? spasm)  . GERD (gastroesophageal reflux disease)   . Arthritis   . PAD (peripheral artery disease)     a.  ABI (01/2014):  L 0.53; R 1.0 >> referred to PV  . High cholesterol   . Chronic back pain     "from the neck to the lower back" (05/28/2014)   Past Surgical History  Procedure Laterality Date  . Back surgery    . Anterior cervical decomp/discectomy fusion  08/2010  . Shoulder arthroscopy Right 2000    debridement,  . Posterior cervical fusion/foraminotomy N/A 12/10/2012    Procedure: POSTERIOR CERVICAL FUSION/FORAMINOTOMY LEVEL 1;  Surgeon: Ophelia Charter, MD;   Location: [place];  Service: Neurosurgery;  Laterality: N/A;  Cervical three-four posterior cervical fusion with lateral mass screws  . Left heart catheterization with coronary angiogram N/A 06/12/2012    Procedure: LEFT HEART CATHETERIZATION WITH CORONARY ANGIOGRAM;  Surgeon: Josue Hector, MD;  Location: [facility] CATH LAB;  Service: Cardiovascular;  Laterality: N/A;  . Abdominal aortagram N/A 02/19/2014    Procedure: ABDOMINAL Maxcine Ham;  Surgeon: Wellington Hampshire, MD;  Location: [facility] CATH LAB;  Service: Cardiovascular;  Laterality: N/A;  . Popliteal artery angioplasty Left 05/28/2014    unsuccessful/notes 05/28/2014  . Umbilical hernia repair  1980  . Hernia repair  ~ 1980    UHR  . Lumbar disc surgery  03/2009    "cut out 1/2 the disc"  . Posterior lumbar fusion  10/2009; 07/2013  . Shoulder arthroscopy w/ rotator cuff repair Right 2004  . Lower extremity angiogram Left 05/28/2014    Procedure: LOWER EXTREMITY ANGIOGRAM;  Surgeon: Wellington Hampshire, MD;  Location: [facility] CATH LAB;  Service: Cardiovascular;  Laterality: Left;  Failed PTA CTO   Family History  Problem Relation Age of Onset  . Thyroid disease Mother     hypothyroidism  . Heart attack Maternal Grandfather   . Heart Problems Father     pacermaker  . Edema Father   . Heart disease Maternal Grandmother   . Cancer Maternal Grandmother   . Diabetes Maternal Grandmother   . Hypertension Maternal Grandmother    Social History  Substance Use Topics  . Smoking status: Never Smoker   . Smokeless tobacco: Never Used  . Alcohol Use: 0.0 oz/week     Comment: 05/28/2014 "might have a few beers a couple times/month"    Review of Systems A 10 point review of systems was completed and was negative except for pertinent positives and negatives as mentioned in the history of present illness     Allergies  Vicodin  Home Medications   Prior to Admission medications   Medication Sig Start Date End Date Taking? Authorizing Provider   amLODipine (NORVASC) 5 MG tablet TAKE 1 TABLET (5 MG TOTAL) BY MOUTH DAILY. 03/17/14   Darlin Coco, MD  aspirin EC 81 MG tablet Take 1 tablet (81 mg total) by mouth daily. 01/28/14   Liliane Shi, PA-C  atorvastatin (LIPITOR) 40 MG tablet Take 40 mg by mouth at bedtime.     Historical Provider, MD  cilostazol (PLETAL) 100 MG tablet TAKE 1 TABLET BY MOUTH TWICE DAILY. 10/24/14   Wellington Hampshire, MD  clonazePAM (KLONOPIN) 2 MG tablet Take 2 mg by mouth at bedtime.    Historical Provider, MD  escitalopram (LEXAPRO) 20 MG tablet Take 20 mg by mouth daily.  05/14/14   Historical Provider, MD  levothyroxine (SYNTHROID, LEVOTHROID) 100 MCG tablet Take 1 tablet (100 mcg total) by mouth daily before breakfast. 02/12/14   Renato Shin, MD  Magnesium 250 MG TABS Take 250 mg by mouth daily.    Historical Provider, MD  meclizine (ANTIVERT) 25 MG tablet Take 1 tablet (25 mg total) by mouth 3 (three) times daily as needed for dizziness. 10/24/14   Wandra Arthurs, MD  metoprolol (LOPRESSOR) 50 MG tablet Take 0.5 tablets (25 mg total) by mouth 2 (two) times daily. 01/28/14   Liliane Shi, PA-C  nitroGLYCERIN (NITROSTAT) 0.4 MG SL tablet Place 1 tablet (0.4 mg total) under the tongue every 5 (five) minutes as needed for chest pain. 08/05/14   Wellington Hampshire, MD  omega-3 acid ethyl esters (LOVAZA) 1 G capsule Take 1 g by mouth daily.    Historical Provider, MD  omeprazole (PRILOSEC) 40 MG capsule Take 40 mg by mouth daily.    Historical Provider, MD  oxyCODONE-acetaminophen (PERCOCET) 10-325 MG per tablet Take 1 tablet by mouth every 8 (eight) hours as needed for pain (for back pain).  05/15/14   Historical Provider, MD  Potassium Gluconate 595 MG CAPS Take 1 capsule by mouth daily.    Historical Provider, MD  sildenafil (VIAGRA) 25 MG tablet Take 1 tablet (25 mg total) by mouth daily as needed for erectile dysfunction. 12/09/14   Wellington Hampshire, MD  zolpidem (AMBIEN) 10 MG tablet Take 10 mg by mouth at bedtime as  needed. For sleep 11/15/14   Historical Provider, MD   BP 102/64 mmHg  Pulse 112  Temp(Src) 101.6 F (38.7 C) (Rectal)  Resp 16  Ht 5\' 10"  (1.778 m)  Wt 246 lb (111.585 kg)  BMI 35.30 kg/m2  SpO2 96% Physical Exam  Constitutional: He is oriented to person, place, and time. He appears well-developed and well-nourished.  HENT:  Head: Normocephalic and atraumatic.  Mouth/Throat: Oropharynx is clear and moist.  Eyes: Conjunctivae are normal. Pupils are equal, round, and reactive to light. Right eye exhibits no discharge. Left eye exhibits no discharge. No scleral icterus.  Neck: Neck supple.  Cardiovascular: Normal rate, regular rhythm and normal heart sounds.   Pulmonary/Chest: Effort normal and breath sounds normal. No respiratory  distress. He has no wheezes. He has no rales.  Abdominal: Soft.  Abdomen is soft, nondistended. No focal abdominal tenderness. Negative McBurney's and Murphy's.  Genitourinary: Penis normal.  Tenderness diffusely to posterior aspect of right testicle. No obvious high riding testicle or horizontal line noted.  Musculoskeletal: He exhibits no tenderness.  Neurological: He is alert and oriented to person, place, and time.  Cranial Nerves II-XII grossly intact  Skin: Skin is warm and dry. No rash noted.  Psychiatric: He has a normal mood and affect.  Nursing note and vitals reviewed.   ED Course  Procedures (including critical care time) Labs Review Labs Reviewed  URINALYSIS, ROUTINE W REFLEX MICROSCOPIC (NOT AT [facility]) - Abnormal; Notable for the following:    APPearance TURBID (*)    Hgb urine dipstick LARGE (*)    Nitrite POSITIVE (*)    Leukocytes, UA MODERATE (*)    All other components within normal limits  URINE MICROSCOPIC-ADD ON - Abnormal; Notable for the following:    Bacteria, UA MANY (*)    All other components within normal limits  CBC WITH DIFFERENTIAL/PLATELET - Abnormal; Notable for the following:    Platelets 144 (*)    Neutrophils  Relative % 90 (*)    Lymphocytes Relative 9 (*)    Monocytes Relative 1 (*)    All other components within normal limits  I-STAT CHEM 8, ED - Abnormal; Notable for the following:    Creatinine, Ser 1.40 (*)    Glucose, Bld 136 (*)    Calcium, Ion 1.07 (*)    All other components within normal limits  I-STAT CG4 LACTIC ACID, ED - Abnormal; Notable for the following:    Lactic Acid, Venous 3.40 (*)    All other components within normal limits  I-STAT CG4 LACTIC ACID, ED - Abnormal; Notable for the following:    Lactic Acid, Venous 2.50 (*)    All other components within normal limits  URINE CULTURE  CULTURE, BLOOD (ROUTINE X 2)  CULTURE, BLOOD (ROUTINE X 2)    Imaging Review US Scrotum  01/04/2015   CLINICAL DATA:  49 year old with acute onset of right scrotal pain which began at 0600 hr earlier today.  EXAM: SCROTAL ULTRASOUND  DOPPLER ULTRASOUND OF THE TESTICLES  TECHNIQUE: Complete ultrasound examination of the testicles, epididymis, and other scrotal structures was performed. Color and spectral Doppler ultrasound were also utilized to evaluate blood flow to the testicles.  COMPARISON:  CT abdomen and pelvis earlier same date and 05/28/2014. A left scrotal hematoma was present on the prior CT 05/28/2014  FINDINGS: Right testicle  Measurements: Approximately 3.9 x 2.4 x 2.7 cm. Normal parenchymal echotexture without mass or microlithiasis. Normal color Doppler flow without evidence of hyperemia.  Left testicle  Measurements: Approximately 3.6 x 2.2 x 3.0 cm. Normal parenchymal echotexture without mass or microlithiasis. Normal color Doppler flow without evidence of hyperemia.  Right epididymis: Normal in size and appearance without evidence of hyperemia.  Left epididymis: Normal in size and appearance without evidence of hyperemia.  Hydrocele:  None visualized.  Varicocele: Moderate-sized left varicocele. No evidence of right varicocele.  Pulsed Doppler interrogation of both testes demonstrates  normal low resistance arterial and venous waveforms bilaterally.  IMPRESSION: 1. Normal-appearing bilateral testes and epididymides. Specifically, no evidence of right testicular torsion or epididymo-orchitis. 2. Moderate-sized left varicocele.   Electronically Signed   By: Evangeline Dakin M.D.   On: 01/04/2015 13:16   [place] Art/ven Flow Abd Pelv Doppler  01/04/2015  CLINICAL DATA:  49 year old with acute onset of right scrotal pain which began at 0600 hr earlier today.  EXAM: SCROTAL ULTRASOUND  DOPPLER ULTRASOUND OF THE TESTICLES  TECHNIQUE: Complete ultrasound examination of the testicles, epididymis, and other scrotal structures was performed. Color and spectral Doppler ultrasound were also utilized to evaluate blood flow to the testicles.  COMPARISON:  CT abdomen and pelvis earlier same date and 05/28/2014. A left scrotal hematoma was present on the prior CT 05/28/2014  FINDINGS: Right testicle  Measurements: Approximately 3.9 x 2.4 x 2.7 cm. Normal parenchymal echotexture without mass or microlithiasis. Normal color Doppler flow without evidence of hyperemia.  Left testicle  Measurements: Approximately 3.6 x 2.2 x 3.0 cm. Normal parenchymal echotexture without mass or microlithiasis. Normal color Doppler flow without evidence of hyperemia.  Right epididymis: Normal in size and appearance without evidence of hyperemia.  Left epididymis: Normal in size and appearance without evidence of hyperemia.  Hydrocele:  None visualized.  Varicocele: Moderate-sized left varicocele. No evidence of right varicocele.  Pulsed Doppler interrogation of both testes demonstrates normal low resistance arterial and venous waveforms bilaterally.  IMPRESSION: 1. Normal-appearing bilateral testes and epididymides. Specifically, no evidence of right testicular torsion or epididymo-orchitis. 2. Moderate-sized left varicocele.   Electronically Signed   By: Evangeline Dakin M.D.   On: 01/04/2015 13:16   Ct Renal Stone  Study  01/04/2015   CLINICAL DATA:  Testicular pain. RIGHT flank pain. Onset of symptoms at 0600 hr.  EXAM: CT ABDOMEN AND PELVIS WITHOUT CONTRAST  TECHNIQUE: Multidetector CT imaging of the abdomen and pelvis was performed following the standard protocol without IV contrast.  COMPARISON:  05/28/2014.  FINDINGS: Musculoskeletal: L4 through S1 PLIF. No aggressive osseous lesions. Thoracolumbar vertebral body height is preserved.  Lung Bases: Dependent atelectasis.  Extrapleural fat on the LEFT.  Liver: Unenhanced CT was performed per clinician order. Lack of IV contrast limits sensitivity and specificity, especially for evaluation of abdominal/pelvic solid viscera.  Spleen:  Normal.  Gallbladder:  Normal.  Common bile duct:  Normal.  Pancreas:  Normal.  Adrenal glands:  Normal bilaterally.  Kidneys: LEFT kidney and ureter appear normal. No obstruction or inflammatory change.  The RIGHT kidney shows mild perinephric stranding. Minimal ectasia of the calices. Mild ectasia of the RIGHT ureter with proximal perinephric inflammatory changes. 2 mm calculus is present at the RIGHT UVJ.  Stomach:  Small hiatal hernia.  Otherwise normal.  Small bowel:  Normal.  No mesenteric adenopathy.  Colon: Normal appendix. No inflammatory changes of colon. Transverse and descending colon collapsed.  Pelvic Genitourinary: Urinary bladder is mostly collapsed. Probable bilateral varicoceles.  Peritoneum: No free fluid or free air.  Vascular/lymphatic: Mild atherosclerosis.  Body Wall: Normal.  IMPRESSION: 1. 2 mm RIGHT UVJ stone.  No significant hydronephrosis. 2. No residual collecting system calculi.   Electronically Signed   By: Dereck Ligas M.D.   On: 01/04/2015 12:54   I have personally reviewed and evaluated these images and lab results as part of my medical decision-making.   EKG Interpretation   Date/Time:  Sunday January 04 2015 15:38:16 EDT Ventricular Rate:  110 PR Interval:  151 QRS Duration: 82 QT Interval:   349 QTC Calculation: 472 R Axis:   70 Text Interpretation:  Sinus tachycardia Nonspecific T abnrm, anterolateral  leads rate is faster, otherwise no significant change Confirmed by  GOLDSTON  MD, SCOTT ([id number]) on 01/04/2015 3:48:17 PM     Meds given in ED:  Medications  sodium chloride 0.9 %  bolus 1,000 mL (1,000 mLs Intravenous New Bag/Given 01/04/15 1631)    Followed by  sodium chloride 0.9 % bolus 500 mL (not administered)  ketorolac (TORADOL) 30 MG/ML injection 30 mg (30 mg Intravenous Given 01/04/15 1316)  metoCLOPramide (REGLAN) injection 10 mg (10 mg Intravenous Given 01/04/15 1315)  sodium chloride 0.9 % bolus 1,000 mL (0 mLs Intravenous Stopped 01/04/15 1540)  cefTRIAXone (ROCEPHIN) 1 g in dextrose 5 % 50 mL IVPB (0 g Intravenous Stopped 01/04/15 1541)  sodium chloride 0.9 % bolus 1,000 mL (0 mLs Intravenous Stopped 01/04/15 1630)    New Prescriptions   No medications on file   Filed Vitals:   01/04/15 1546 01/04/15 1549 01/04/15 1600 01/04/15 1615  BP:   107/89 102/64  Pulse:   122 112  Temp: 101.6 F (38.7 C)     TempSrc: Rectal     Resp:   16   Height:  5\' 10"  (1.778 m)    Weight:  246 lb (111.585 kg)    SpO2:   93% 96%   CRITICAL CARE Performed by: Verl Dicker   Total critical care time: 35  Critical care time was exclusive of separately billable procedures and treating other patients.  Critical care was necessary to treat or prevent imminent or life-threatening deterioration.  Critical care was time spent personally by me on the following activities: development of treatment plan with patient and/or surrogate as well as nursing, discussions with consultants, evaluation of patient's response to treatment, examination of patient, obtaining history from patient or surrogate, ordering and performing treatments and interventions, ordering and review of laboratory studies, ordering and review of radiographic studies, pulse oximetry and re-evaluation of patient's  condition.  MDM  2:22: Upon reevaluation, patient states he feels much better, however he is tachycardic to 130s, hypotensive to 87/42. Denies chest pain, shortness of breath or abdominal pain at this time.  CT renal stone study shows 2 mm right UVJ stone without significant hydronephrosis. No other obvious intra-abdominal pathology. No leukocytosis, however patient has a 3.4 lactic acid with evidence of UTI on urinalysis (PYURIA WITH NITRITE POS), creatinine elevated 1.4 from baseline of 1.2  Discussed patient presentation and ED course my attending, Dr. Regenia Skeeter who agrees with plan to consult urology. 3:05--Spoke with urology, Dr. Diona Fanti, will see in the ED. Patient taken to the OR for urgent stenting. Dr. Diona Fanti requests medical admission. Consult to hospitalist, Dr. Redmond Pulling, will see in ED.  Final diagnoses:  Testicular/scrotal pain  Tachycardia        Comer Locket, PA-C 01/04/15 1639  Sherwood Gambler, MD 01/12/15 1556

## 2015-01-04 NOTE — Transfer of Care (Signed)
Immediate Anesthesia Transfer of Care Note  Patient: Jeff [name]  Procedure(s) Performed: Procedure(s): CYSTOSCOPY WITH RETROGRADE PYELOGRAM/URETERAL STENT PLACEMENT (Right)  Patient Location: PACU  Anesthesia Type:General  Level of Consciousness: sedated  Airway & Oxygen Therapy: Patient Spontanous Breathing and Patient connected to face mask oxygen  Post-op Assessment: Report given to RN and Post -op Vital signs reviewed and stable  Post vital signs: Reviewed and stable  Last Vitals:  Filed Vitals:   01/04/15 1615  BP: 102/64  Pulse: 112  Temp:   Resp:     Complications: No apparent anesthesia complications

## 2015-01-04 NOTE — Progress Notes (Signed)
Hospitalist notified of pt's status. Requested post-op/admission orders.

## 2015-01-04 NOTE — Anesthesia Postprocedure Evaluation (Signed)
  Anesthesia Post-op Note  Patient: Jeff [name]  Procedure(s) Performed: Procedure(s): CYSTOSCOPY WITH RETROGRADE PYELOGRAM/URETERAL STENT PLACEMENT (Right)  Patient Location: PACU  Anesthesia Type:General  Level of Consciousness: awake and sedated  Airway and Oxygen Therapy: Patient Spontanous Breathing  Post-op Pain: mild  Post-op Assessment: Post-op Vital signs reviewed              Post-op Vital Signs: stable  Last Vitals:  Filed Vitals:   01/04/15 1830  BP: 122/63  Pulse: 139  Temp:   Resp: 34    Complications: No apparent anesthesia complications

## 2015-01-04 NOTE — Progress Notes (Signed)
Report called to Marya Amsler, RN on 2100. Patient to transfer to 2114. Wife at bedside. Updated on POC. Rapid Response RN at bedside starting Neo infusion per order. Richardson Landry Minor, NP at bedside.

## 2015-01-05 ENCOUNTER — Inpatient Hospital Stay (HOSPITAL_COMMUNITY): Payer: Medicaid Other

## 2015-01-05 DIAGNOSIS — I255 Ischemic cardiomyopathy: Secondary | ICD-10-CM

## 2015-01-05 DIAGNOSIS — A419 Sepsis, unspecified organism: Secondary | ICD-10-CM

## 2015-01-05 DIAGNOSIS — R6521 Severe sepsis with septic shock: Secondary | ICD-10-CM

## 2015-01-05 HISTORY — PX: TRANSTHORACIC ECHOCARDIOGRAM: SHX275

## 2015-01-05 HISTORY — DX: Sepsis, unspecified organism: A41.9

## 2015-01-05 LAB — LACTIC ACID, PLASMA
Lactic Acid, Venous: 6.5 mmol/L (ref 0.5–2.0)
Lactic Acid, Venous: 6.5 mmol/L (ref 0.5–2.0)
Lactic Acid, Venous: 6.3 mmol/L (ref 0.5–2.0)
Lactic Acid, Venous: 3.8 mmol/L (ref 0.5–2.0)
Lactic Acid, Venous: 2.4 mmol/L (ref 0.5–2.0)

## 2015-01-05 LAB — CBC
HCT: 38.6 % — ABNORMAL LOW (ref 39.0–52.0)
HCT: 35.1 % — ABNORMAL LOW (ref 39.0–52.0)
Hemoglobin: 12.6 g/dL — ABNORMAL LOW (ref 13.0–17.0)
Hemoglobin: 11.5 g/dL — ABNORMAL LOW (ref 13.0–17.0)
MCH: 28.3 pg (ref 26.0–34.0)
MCH: 28.4 pg (ref 26.0–34.0)
MCHC: 32.6 g/dL (ref 30.0–36.0)
MCHC: 32.8 g/dL (ref 30.0–36.0)
MCV: 86.5 fL (ref 78.0–100.0)
MCV: 86.7 fL (ref 78.0–100.0)
Platelets: 90 10*3/uL — ABNORMAL LOW (ref 150–400)
Platelets: 70 10*3/uL — ABNORMAL LOW (ref 150–400)
RBC: 4.46 MIL/uL (ref 4.22–5.81)
RBC: 4.05 MIL/uL — ABNORMAL LOW (ref 4.22–5.81)
RDW: 14 % (ref 11.5–15.5)
RDW: 14.2 % (ref 11.5–15.5)
WBC: 14.5 10*3/uL — ABNORMAL HIGH (ref 4.0–10.5)
WBC: 19.3 10*3/uL — ABNORMAL HIGH (ref 4.0–10.5)

## 2015-01-05 LAB — BASIC METABOLIC PANEL
Anion gap: 12 (ref 5–15)
Anion gap: 9 (ref 5–15)
BUN: 10 mg/dL (ref 6–20)
BUN: 11 mg/dL (ref 6–20)
CO2: 19 mmol/L — ABNORMAL LOW (ref 22–32)
CO2: 21 mmol/L — ABNORMAL LOW (ref 22–32)
Calcium: 8 mg/dL — ABNORMAL LOW (ref 8.9–10.3)
Calcium: 7.9 mg/dL — ABNORMAL LOW (ref 8.9–10.3)
Chloride: 112 mmol/L — ABNORMAL HIGH (ref 101–111)
Chloride: 111 mmol/L (ref 101–111)
Creatinine, Ser: 2.07 mg/dL — ABNORMAL HIGH (ref 0.61–1.24)
Creatinine, Ser: 1.76 mg/dL — ABNORMAL HIGH (ref 0.61–1.24)
GFR calc Af Amer: 42 mL/min — ABNORMAL LOW (ref >60)
GFR calc Af Amer: 51 mL/min — ABNORMAL LOW (ref >60)
GFR calc non Af Amer: 36 mL/min — ABNORMAL LOW (ref >60)
GFR calc non Af Amer: 44 mL/min — ABNORMAL LOW (ref >60)
Glucose, Bld: 138 mg/dL — ABNORMAL HIGH (ref 65–99)
Glucose, Bld: 106 mg/dL — ABNORMAL HIGH (ref 65–99)
Potassium: 4.3 mmol/L (ref 3.5–5.1)
Potassium: 3.8 mmol/L (ref 3.5–5.1)
Sodium: 143 mmol/L (ref 135–145)
Sodium: 141 mmol/L (ref 135–145)

## 2015-01-05 LAB — MRSA PCR SCREENING: MRSA by PCR: NEGATIVE

## 2015-01-05 LAB — CORTISOL: Cortisol, Plasma: 16.6 ug/dL

## 2015-01-05 LAB — CARBOXYHEMOGLOBIN
Carboxyhemoglobin: 1.2 % (ref 0.5–1.5)
Methemoglobin: 1.3 % (ref 0.0–1.5)
O2 Saturation: 75.1 %
Total hemoglobin: 13.2 g/dL — ABNORMAL LOW (ref 13.5–18.0)

## 2015-01-05 LAB — APTT: aPTT: 40 seconds — ABNORMAL HIGH (ref 24–37)

## 2015-01-05 LAB — MAGNESIUM
Magnesium: 1.3 mg/dL — ABNORMAL LOW (ref 1.7–2.4)
Magnesium: 1.3 mg/dL — ABNORMAL LOW (ref 1.7–2.4)

## 2015-01-05 LAB — PROTIME-INR
INR: 1.46 (ref 0.00–1.49)
Prothrombin Time: 17.8 seconds — ABNORMAL HIGH (ref 11.6–15.2)

## 2015-01-05 LAB — TROPONIN I
Troponin I: 0.08 ng/mL — ABNORMAL HIGH (ref <0.031)
Troponin I: 0.13 ng/mL — ABNORMAL HIGH (ref <0.031)
Troponin I: 0.29 ng/mL — ABNORMAL HIGH (ref <0.031)

## 2015-01-05 LAB — FIBRINOGEN: Fibrinogen: 259 mg/dL (ref 204–475)

## 2015-01-05 LAB — GLUCOSE, CAPILLARY: Glucose-Capillary: 92 mg/dL (ref 65–99)

## 2015-01-05 LAB — PHOSPHORUS: Phosphorus: 1.4 mg/dL — ABNORMAL LOW (ref 2.5–4.6)

## 2015-01-05 MED ORDER — CLONAZEPAM 1 MG PO TABS
2.0000 mg | ORAL_TABLET | Freq: Every day | ORAL | Status: DC | PRN
  Administered 2015-01-06: 2 mg via ORAL
  Filled 2015-01-05 (×2): qty 4

## 2015-01-05 MED ORDER — MAGNESIUM OXIDE 400 (241.3 MG) MG PO TABS
400.0000 mg | ORAL_TABLET | Freq: Two times a day (BID) | ORAL | Status: DC
  Administered 2015-01-05: 400 mg via ORAL
  Filled 2015-01-05 (×3): qty 1

## 2015-01-05 MED ORDER — SODIUM CHLORIDE 0.9 % IV BOLUS (SEPSIS)
1000.0000 mL | Freq: Once | INTRAVENOUS | Status: AC
  Administered 2015-01-06: 1000 mL via INTRAVENOUS

## 2015-01-05 MED ORDER — PHENYLEPHRINE HCL 10 MG/ML IJ SOLN
0.0000 ug/min | INTRAVENOUS | Status: DC
  Administered 2015-01-05: 300 ug/min via INTRAVENOUS
  Administered 2015-01-05: 200 ug/min via INTRAVENOUS
  Filled 2015-01-05 (×3): qty 4

## 2015-01-05 MED ORDER — ZOLPIDEM TARTRATE 5 MG PO TABS
10.0000 mg | ORAL_TABLET | Freq: Every day | ORAL | Status: AC
  Administered 2015-01-05: 10 mg via ORAL
  Filled 2015-01-05: qty 2

## 2015-01-05 MED ORDER — ACETAMINOPHEN 325 MG PO TABS
650.0000 mg | ORAL_TABLET | Freq: Four times a day (QID) | ORAL | Status: DC | PRN
  Administered 2015-01-05 (×2): 650 mg via ORAL
  Filled 2015-01-05 (×2): qty 2

## 2015-01-05 MED ORDER — SODIUM CHLORIDE 0.9 % IV BOLUS (SEPSIS)
1000.0000 mL | Freq: Once | INTRAVENOUS | Status: AC
  Administered 2015-01-05: 1000 mL via INTRAVENOUS

## 2015-01-05 MED ORDER — SODIUM CHLORIDE 0.9 % IV BOLUS (SEPSIS): 1000.0000 mL | Freq: Once | INTRAVENOUS | Status: DC

## 2015-01-05 MED ORDER — K PHOS MONO-SOD PHOS DI & MONO 155-852-130 MG PO TABS
500.0000 mg | ORAL_TABLET | Freq: Two times a day (BID) | ORAL | Status: DC
  Administered 2015-01-05: 500 mg via ORAL
  Filled 2015-01-05 (×3): qty 2

## 2015-01-05 MED ORDER — LEVOFLOXACIN 500 MG PO TABS
500.0000 mg | ORAL_TABLET | Freq: Every day | ORAL | Status: DC
  Administered 2015-01-05 – 2015-01-06 (×2): 500 mg via ORAL
  Filled 2015-01-05 (×4): qty 1

## 2015-01-05 MED ORDER — ZOLPIDEM TARTRATE 5 MG PO TABS
10.0000 mg | ORAL_TABLET | Freq: Every evening | ORAL | Status: DC | PRN
  Administered 2015-01-06: 10 mg via ORAL
  Filled 2015-01-05: qty 2

## 2015-01-05 MED ORDER — CLONAZEPAM 0.5 MG PO TABS
2.0000 mg | ORAL_TABLET | Freq: Every day | ORAL | Status: AC
  Administered 2015-01-05: 2 mg via ORAL
  Filled 2015-01-05: qty 4

## 2015-01-05 MED ORDER — NOREPINEPHRINE BITARTRATE 1 MG/ML IV SOLN
2.0000 ug/min | INTRAVENOUS | Status: DC
  Administered 2015-01-05: 2 ug/min via INTRAVENOUS
  Filled 2015-01-05: qty 4

## 2015-01-05 NOTE — Progress Notes (Signed)
eLink Physician-Brief Progress Note Patient Name: Jeff [name] DOB: 05/17/1965 MRN: [id number]   Date of Service  01/05/2015  HPI/Events of Note  HR = 151. Sinus Tachycardia. CVP = 1.  eICU Interventions  Will bolus with 0.9 NaCl 1 liter IV over 1 hour now.      Intervention Category Intermediate Interventions: Hypovolemia - evaluation and management;Arrhythmia - evaluation and management  Sommer,Steven Eugene 01/05/2015, 11:48 PM

## 2015-01-05 NOTE — Consult Note (Signed)
PULMONARY / CRITICAL CARE MEDICINE   Name: Jeff [name] MRN: [id number] DOB: 27-Sep-1965    ADMISSION DATE:  01/04/2015 CONSULTATION DATE:  8/22  REFERRING MD : Graciella Freer  CHIEF COMPLAINT:  Rt flank pain  INITIAL PRESENTATION: Rt flank pain  STUDIES:    SIGNIFICANT EVENTS: 8/21 rt double J stent   HISTORY OF PRESENT ILLNESS:   49 yo AAM with PMH significant for cad , post STEMI 5 years ago, HTN on 2 oral agent, kidney stones  And he reports hematuria x 3 weeks intermittently. He presents to [facility] on 8/21 with intense rt flank pain which he describes as kidney stone pain. He was taken to OR by urology and rt double J stent was placed. Despite 3 litre of fluid his sbp remains less than 90 and his latic acid continues to rise. He is alrt, talkative and appears much better than his numbers.  We will move him to ICU, start on pressors, monitor lactic acid for clearing.   PAST MEDICAL HISTORY :   has a past medical history of HYPERTENSION (03/16/2010); HYPERTHYROIDISM (03/16/2010); THYROID STORM (03/16/2010); Altered mental status (03/16/2010); Hypothyroidism following radioiodine therapy; NSTEMI (non-ST elevated myocardial infarction); GERD (gastroesophageal reflux disease); Arthritis; PAD (peripheral artery disease); High cholesterol; and Chronic back pain.  has past surgical history that includes Back surgery; Anterior cervical decomp/discectomy fusion (08/2010); Shoulder arthroscopy (Right, 2000); Posterior cervical fusion/foraminotomy (N/A, 12/10/2012); left heart catheterization with coronary angiogram (N/A, 06/12/2012); abdominal aortagram (N/A, 02/19/2014); Popliteal artery angioplasty (Left, 05/28/2014); Umbilical hernia repair (1980); Hernia repair (~ 1980); Lumbar disc surgery (03/2009); Posterior lumbar fusion (10/2009; 07/2013); Shoulder arthroscopy w/ rotator cuff repair (Right, 2004); and lower extremity angiogram (Left, 05/28/2014). Prior to Admission medications   Medication Sig Start Date  End Date Taking? Authorizing Provider  amLODipine (NORVASC) 5 MG tablet TAKE 1 TABLET (5 MG TOTAL) BY MOUTH DAILY. 03/17/14   Darlin Coco, MD  aspirin EC 81 MG tablet Take 1 tablet (81 mg total) by mouth daily. 01/28/14   Liliane Shi, PA-C  atorvastatin (LIPITOR) 40 MG tablet Take 40 mg by mouth at bedtime.     Historical Provider, MD  cilostazol (PLETAL) 100 MG tablet TAKE 1 TABLET BY MOUTH TWICE DAILY. 10/24/14   Wellington Hampshire, MD  clonazePAM (KLONOPIN) 2 MG tablet Take 2 mg by mouth at bedtime.    Historical Provider, MD  escitalopram (LEXAPRO) 20 MG tablet Take 20 mg by mouth daily.  05/14/14   Historical Provider, MD  levothyroxine (SYNTHROID, LEVOTHROID) 100 MCG tablet Take 1 tablet (100 mcg total) by mouth daily before breakfast. 02/12/14   Renato Shin, MD  Magnesium 250 MG TABS Take 250 mg by mouth daily.    Historical Provider, MD  meclizine (ANTIVERT) 25 MG tablet Take 1 tablet (25 mg total) by mouth 3 (three) times daily as needed for dizziness. 10/24/14   Wandra Arthurs, MD  metoprolol (LOPRESSOR) 50 MG tablet Take 0.5 tablets (25 mg total) by mouth 2 (two) times daily. 01/28/14   Liliane Shi, PA-C  nitroGLYCERIN (NITROSTAT) 0.4 MG SL tablet Place 1 tablet (0.4 mg total) under the tongue every 5 (five) minutes as needed for chest pain. 08/05/14   Wellington Hampshire, MD  omega-3 acid ethyl esters (LOVAZA) 1 G capsule Take 1 g by mouth daily.    Historical Provider, MD  omeprazole (PRILOSEC) 40 MG capsule Take 40 mg by mouth daily.    Historical Provider, MD  oxyCODONE-acetaminophen (PERCOCET) 10-325 MG per tablet  Take 1 tablet by mouth every 8 (eight) hours as needed for pain (for back pain).  05/15/14   Historical Provider, MD  Potassium Gluconate 595 MG CAPS Take 1 capsule by mouth daily.    Historical Provider, MD  sildenafil (VIAGRA) 25 MG tablet Take 1 tablet (25 mg total) by mouth daily as needed for erectile dysfunction. 12/09/14   Wellington Hampshire, MD  zolpidem (AMBIEN) 10 MG  tablet Take 10 mg by mouth at bedtime as needed. For sleep 11/15/14   Historical Provider, MD   Allergies  Allergen Reactions  . Vicodin [Hydrocodone-Acetaminophen] Hives    FAMILY HISTORY:  indicated that his mother is alive. He indicated that his father is alive. He indicated that his maternal grandmother is deceased. He indicated that his maternal grandfather is deceased. He indicated that his paternal grandmother is deceased. He indicated that his paternal grandfather is deceased.  SOCIAL HISTORY:  reports that he has never smoked. He has never used smokeless tobacco. He reports that he drinks alcohol. He reports that he does not use illicit drugs.  REVIEW OF SYSTEMS:  10 point review of system taken, please see HPI for positives and negatives.   SUBJECTIVE:   VITAL SIGNS: Temp:  [97.6 F (36.4 C)-103.4 F (39.7 C)] 99 F (37.2 C) (08/21 2310) Pulse Rate:  [73-152] 118 (08/21 2310) Resp:  [16-37] 28 (08/21 2310) BP: (73-128)/(40-89) 74/49 mmHg (08/21 2345) SpO2:  [91 %-100 %] 95 % (08/21 2310) Weight:  [246 lb (111.585 kg)-257 lb 1.6 oz (116.62 kg)] 257 lb 1.6 oz (116.62 kg) (08/21 2158) HEMODYNAMICS:   VENTILATOR SETTINGS:   INTAKE / OUTPUT:  Intake/Output Summary (Last 24 hours) at 01/05/15 0013 Last data filed at 01/04/15 2300  Gross per 24 hour  Intake   4575 ml  Output    925 ml  Net   3650 ml    PHYSICAL EXAMINATION: General:  WNWD AAM Neuro:  Intact HEENT:  No JVD/LAN , oral mucosa dry Cardiovascular:  HSR RRR Lungs:  CTA Abdomen:  Soft + BS Musculoskeletal:  Intact Skin:  Warm and dry GU: voids clear urine  LABS:  CBC  Recent Labs Lab 01/04/15 1327 01/04/15 1334 01/04/15 2018  WBC 8.0  --  4.8  HGB 14.7 16.7 13.4  HCT 44.7 49.0 41.7  PLT 144*  --  88*   Coag's No results for input(s): APTT, INR in the last 168 hours. BMET  Recent Labs Lab 01/04/15 1334 01/04/15 2018  NA 141 139  K 3.8 3.4*  CL 101 106  CO2  --  19*  BUN 7 8   CREATININE 1.40* 2.07*  GLUCOSE 136* 131*   Electrolytes  Recent Labs Lab 01/04/15 2018  CALCIUM 8.1*  MG 0.9*   Sepsis Markers  Recent Labs Lab 01/04/15 1522 01/04/15 2018 01/04/15 2326  LATICACIDVEN 2.50* 5.8* 6.5*   ABG No results for input(s): PHART, PCO2ART, PO2ART in the last 168 hours. Liver Enzymes No results for input(s): AST, ALT, ALKPHOS, BILITOT, ALBUMIN in the last 168 hours. Cardiac Enzymes No results for input(s): TROPONINI, PROBNP in the last 168 hours. Glucose No results for input(s): GLUCAP in the last 168 hours.  Imaging US Scrotum  01/04/2015   CLINICAL DATA:  49 year old with acute onset of right scrotal pain which began at 0600 hr earlier today.  EXAM: SCROTAL ULTRASOUND  DOPPLER ULTRASOUND OF THE TESTICLES  TECHNIQUE: Complete ultrasound examination of the testicles, epididymis, and other scrotal structures was performed. Color and spectral  Doppler ultrasound were also utilized to evaluate blood flow to the testicles.  COMPARISON:  CT abdomen and pelvis earlier same date and 05/28/2014. A left scrotal hematoma was present on the prior CT 05/28/2014  FINDINGS: Right testicle  Measurements: Approximately 3.9 x 2.4 x 2.7 cm. Normal parenchymal echotexture without mass or microlithiasis. Normal color Doppler flow without evidence of hyperemia.  Left testicle  Measurements: Approximately 3.6 x 2.2 x 3.0 cm. Normal parenchymal echotexture without mass or microlithiasis. Normal color Doppler flow without evidence of hyperemia.  Right epididymis: Normal in size and appearance without evidence of hyperemia.  Left epididymis: Normal in size and appearance without evidence of hyperemia.  Hydrocele:  None visualized.  Varicocele: Moderate-sized left varicocele. No evidence of right varicocele.  Pulsed Doppler interrogation of both testes demonstrates normal low resistance arterial and venous waveforms bilaterally.  IMPRESSION: 1. Normal-appearing bilateral testes and  epididymides. Specifically, no evidence of right testicular torsion or epididymo-orchitis. 2. Moderate-sized left varicocele.   Electronically Signed   By: Evangeline Dakin M.D.   On: 01/04/2015 13:16   [place] Art/ven Flow Abd Pelv Doppler  01/04/2015   CLINICAL DATA:  49 year old with acute onset of right scrotal pain which began at 0600 hr earlier today.  EXAM: SCROTAL ULTRASOUND  DOPPLER ULTRASOUND OF THE TESTICLES  TECHNIQUE: Complete ultrasound examination of the testicles, epididymis, and other scrotal structures was performed. Color and spectral Doppler ultrasound were also utilized to evaluate blood flow to the testicles.  COMPARISON:  CT abdomen and pelvis earlier same date and 05/28/2014. A left scrotal hematoma was present on the prior CT 05/28/2014  FINDINGS: Right testicle  Measurements: Approximately 3.9 x 2.4 x 2.7 cm. Normal parenchymal echotexture without mass or microlithiasis. Normal color Doppler flow without evidence of hyperemia.  Left testicle  Measurements: Approximately 3.6 x 2.2 x 3.0 cm. Normal parenchymal echotexture without mass or microlithiasis. Normal color Doppler flow without evidence of hyperemia.  Right epididymis: Normal in size and appearance without evidence of hyperemia.  Left epididymis: Normal in size and appearance without evidence of hyperemia.  Hydrocele:  None visualized.  Varicocele: Moderate-sized left varicocele. No evidence of right varicocele.  Pulsed Doppler interrogation of both testes demonstrates normal low resistance arterial and venous waveforms bilaterally.  IMPRESSION: 1. Normal-appearing bilateral testes and epididymides. Specifically, no evidence of right testicular torsion or epididymo-orchitis. 2. Moderate-sized left varicocele.   Electronically Signed   By: Evangeline Dakin M.D.   On: 01/04/2015 13:16   Dg Chest Port 1 View  01/04/2015   CLINICAL DATA:  Status post cystoscopy and ureteral stent placement today. Urinary tract infection. Postoperative  exam.  EXAM: PORTABLE CHEST - 1 VIEW  COMPARISON:  PA and lateral chest 07/09/2014.  FINDINGS: The lungs are clear. Heart size is normal. No pneumothorax or pleural effusion. Cervical fusion hardware is partially visualized.  IMPRESSION: No acute disease.   Electronically Signed   By: Inge Rise M.D.   On: 01/04/2015 20:24   Ct Renal Stone Study  01/04/2015   CLINICAL DATA:  Testicular pain. RIGHT flank pain. Onset of symptoms at 0600 hr.  EXAM: CT ABDOMEN AND PELVIS WITHOUT CONTRAST  TECHNIQUE: Multidetector CT imaging of the abdomen and pelvis was performed following the standard protocol without IV contrast.  COMPARISON:  05/28/2014.  FINDINGS: Musculoskeletal: L4 through S1 PLIF. No aggressive osseous lesions. Thoracolumbar vertebral body height is preserved.  Lung Bases: Dependent atelectasis.  Extrapleural fat on the LEFT.  Liver: Unenhanced CT was performed per clinician order.  Lack of IV contrast limits sensitivity and specificity, especially for evaluation of abdominal/pelvic solid viscera.  Spleen:  Normal.  Gallbladder:  Normal.  Common bile duct:  Normal.  Pancreas:  Normal.  Adrenal glands:  Normal bilaterally.  Kidneys: LEFT kidney and ureter appear normal. No obstruction or inflammatory change.  The RIGHT kidney shows mild perinephric stranding. Minimal ectasia of the calices. Mild ectasia of the RIGHT ureter with proximal perinephric inflammatory changes. 2 mm calculus is present at the RIGHT UVJ.  Stomach:  Small hiatal hernia.  Otherwise normal.  Small bowel:  Normal.  No mesenteric adenopathy.  Colon: Normal appendix. No inflammatory changes of colon. Transverse and descending colon collapsed.  Pelvic Genitourinary: Urinary bladder is mostly collapsed. Probable bilateral varicoceles.  Peritoneum: No free fluid or free air.  Vascular/lymphatic: Mild atherosclerosis.  Body Wall: Normal.  IMPRESSION: 1. 2 mm RIGHT UVJ stone.  No significant hydronephrosis. 2. No residual collecting system  calculi.   Electronically Signed   By: Dereck Ligas M.D.   On: 01/04/2015 12:54     ASSESSMENT / PLAN:  PULMONARY OETT A: No acute issue P:     CARDIOVASCULAR CVL 8/22 Lt I J CVL>> A:  Shock sepsis from presumed renal source CAD post MI age 32 HTN P:  Sepsis protocol Check trop for completeness   RENAL Lab Results  Component Value Date   CREATININE 2.07* 01/04/2015   CREATININE 1.40* 01/04/2015   CREATININE 1.25* 10/24/2014    A:  Rt kidney stone Rt double J stent 8/21 Rising creatine P:   Fluids Follow creatine Renal US if continues to rise  GASTROINTESTINAL A:   GI protection  P:   PPI  HEMATOLOGIC A:   No acute issue P:    INFECTIOUS A:   Rt obstructing kidney stone post stent 8/21   P:   BCx2 8/211 >> UC 8/21>>  Abx:  8/21 levaquin>>  ENDOCRINE A:   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 He presents to [facility] on 8/21 with intense rt flank pain which he describes as kidney stone pain. He was taken to OR by urology and rt double J stent was placed. Despite 3 litre of fluid his sbp remains less than 90 and his latic acid continues to rise. He is alrt, talkative and appears much better than his numbers.  We will move him to ICU, start on pressors, monitor lactic acid for clearing.    Richardson Landry Minor ACNP [facility] PCCM Pager [phone] till 3 pm If no answer page [phone] 01/05/2015, 12:47 AM

## 2015-01-05 NOTE — Progress Notes (Signed)
eLink Physician-Brief Progress Note Patient Name: Jeff [name] DOB: 03-12-1966 MRN: [id number]   Date of Service  01/05/2015  HPI/Events of Note  Worsening thrombocytopenia  eICU Interventions  Enoxaparin stopped. SCDs ordered     Intervention Category Intermediate Interventions: Best-practice therapies (e.g. DVT, beta blocker, etc.)  Merton Border 01/05/2015, 5:57 PM

## 2015-01-05 NOTE — Progress Notes (Signed)
  Echocardiogram 2D Echocardiogram has been performed.  Jeff [name] M 01/05/2015, 3:22 PM

## 2015-01-05 NOTE — Progress Notes (Signed)
Notified MD Sommers in regards to patient's HR. Pt's HR has been elevated (130s-140s). Pt has been ambulating to bathroom with assistance. Pt has not complained of dizziness or being light headed when getting up. Pt urine output has been 600 total for last 4hrs approx. However, urine  has been blood tinged. Pt did have uretal stent placed. Will continue to monitor and assess.

## 2015-01-05 NOTE — Progress Notes (Signed)
RT Note: Aline attempted on left radial, blood returned x2 but unable to thread catheter.

## 2015-01-05 NOTE — Progress Notes (Addendum)
CRITICAL VALUE ALERT  Critical value received:  Lactic acid 2.4  Date of notification:  01/05/2015  Time of notification:  9122  Critical value read back: yes  Nurse who received alert:  Marlow Baars   MD notified (1st page):  Dr. Chase Caller  Time of first page:  1501   MD notified (2nd page):  Time of second page:  Responding MD:    Time MD responded:

## 2015-01-05 NOTE — Progress Notes (Addendum)
CRITICAL VALUE ALERT  Critical value received:  Lactic acid 3.8   Date of notification:  01/05/2015  Time of notification:  0921  Critical value read back: yes  Nurse who received alert:  Marlow Baars   MD notified (1st page):  Dr. Chase Caller  Time of first page:  In person  MD notified (2nd page):  Time of second page:  Responding MD:  Chase Caller  Time MD responded:  [phone]

## 2015-01-05 NOTE — Procedures (Signed)
Central Venous Catheter Insertion Procedure Note Jeff [name] [id number] Sep 25, 1965  Procedure: Insertion of Central Venous Catheter Indications: Assessment of intravascular volume, Drug and/or fluid administration and Frequent blood sampling  Procedure Details Consent: Unable to obtain consent because of altered level of consciousness. Time Out: Verified patient identification, verified procedure, site/side was marked, verified correct patient position, special equipment/implants available, medications/allergies/relevent history reviewed, required imaging and test results available.  Performed  Maximum sterile technique was used including antiseptics, cap, gloves, gown, hand hygiene, mask and sheet. Skin prep: Chlorhexidine; local anesthetic administered A antimicrobial bonded/coated triple lumen catheter was placed in the left internal jugular vein using the Seldinger technique. Ultrasound guidance used.Yes.   Catheter placed to 20 cm. Blood aspirated via all 3 ports and then flushed x 3. Line sutured x 2 and dressing applied.  Evaluation Blood flow good Complications: No apparent complications Patient did tolerate procedure well. Chest X-ray ordered to verify placement.  CXR: normal.  Richardson Landry Aldeen Riga ACNP [facility] PCCM Pager [phone] till 3 pm If no answer page [phone] 01/05/2015, 12:59 AM

## 2015-01-05 NOTE — Progress Notes (Signed)
1 Day Post-Op Subjective: Patient reports dysuria and right flank pain with urination. Urine is slightly bloody.  Objective: Vital signs in last 24 hours: Temp:  [97.6 F (36.4 C)-103.4 F (39.7 C)] 97.8 F (36.6 C) (08/22 0822) Pulse Rate:  [73-152] 119 (08/22 0900) Resp:  [14-37] 28 (08/22 0900) BP: (73-128)/(40-89) 113/73 mmHg (08/22 0900) SpO2:  [91 %-100 %] 100 % (08/22 0900) Weight:  [111.585 kg (246 lb)-116.62 kg (257 lb 1.6 oz)] 116.62 kg (257 lb 1.6 oz) (08/21 2158)  Intake/Output from previous day: 08/21 0701 - 08/22 0700 In: 5094.7 [P.O.:300; I.V.:1644.7; IV Piggyback:3150] Out: 3250 [Urine:3250] Intake/Output this shift: Total I/O In: 52.3 [I.V.:52.3] Out: -   Physical Exam:  Constitutional: Vital signs reviewed. WD WN in NAD   Eyes: PERRL, No scleral icterus.   Cardiovascular: Continue tachycardia Pulmonary/Chest: Normal effort   Lab Results:  Recent Labs  01/04/15 1334 01/04/15 2018 01/05/15 0119  HGB 16.7 13.4 12.6*  HCT 49.0 41.7 38.6*   BMET  Recent Labs  01/04/15 2018 01/05/15 0119  NA 139 143  K 3.4* 4.3  CL 106 112*  CO2 19* 19*  GLUCOSE 131* 138*  BUN 8 10  CREATININE 2.07* 2.07*  CALCIUM 8.1* 8.0*    Recent Labs  01/05/15 0119  INR 1.46   No results for input(s): LABURIN in the last 72 hours. Results for orders placed or performed during the hospital encounter of 01/04/15  MRSA PCR Screening     Status: None   Collection Time: 01/04/15 11:19 PM  Result Value Ref Range Status   MRSA by PCR NEGATIVE NEGATIVE Final    Comment:        The GeneXpert MRSA Assay (FDA approved for NASAL specimens only), is one component of a comprehensive MRSA colonization surveillance program. It is not intended to diagnose MRSA infection nor to guide or monitor treatment for MRSA infections.     Studies/Results: US Scrotum  01/04/2015   CLINICAL DATA:  49 year old with acute onset of right scrotal pain which began at 0600 hr earlier  today.  EXAM: SCROTAL ULTRASOUND  DOPPLER ULTRASOUND OF THE TESTICLES  TECHNIQUE: Complete ultrasound examination of the testicles, epididymis, and other scrotal structures was performed. Color and spectral Doppler ultrasound were also utilized to evaluate blood flow to the testicles.  COMPARISON:  CT abdomen and pelvis earlier same date and 05/28/2014. A left scrotal hematoma was present on the prior CT 05/28/2014  FINDINGS: Right testicle  Measurements: Approximately 3.9 x 2.4 x 2.7 cm. Normal parenchymal echotexture without mass or microlithiasis. Normal color Doppler flow without evidence of hyperemia.  Left testicle  Measurements: Approximately 3.6 x 2.2 x 3.0 cm. Normal parenchymal echotexture without mass or microlithiasis. Normal color Doppler flow without evidence of hyperemia.  Right epididymis: Normal in size and appearance without evidence of hyperemia.  Left epididymis: Normal in size and appearance without evidence of hyperemia.  Hydrocele:  None visualized.  Varicocele: Moderate-sized left varicocele. No evidence of right varicocele.  Pulsed Doppler interrogation of both testes demonstrates normal low resistance arterial and venous waveforms bilaterally.  IMPRESSION: 1. Normal-appearing bilateral testes and epididymides. Specifically, no evidence of right testicular torsion or epididymo-orchitis. 2. Moderate-sized left varicocele.   Electronically Signed   By: Evangeline Dakin M.D.   On: 01/04/2015 13:16   [place] Art/ven Flow Abd Pelv Doppler  01/04/2015   CLINICAL DATA:  49 year old with acute onset of right scrotal pain which began at 0600 hr earlier today.  EXAM: SCROTAL ULTRASOUND  DOPPLER ULTRASOUND OF THE TESTICLES  TECHNIQUE: Complete ultrasound examination of the testicles, epididymis, and other scrotal structures was performed. Color and spectral Doppler ultrasound were also utilized to evaluate blood flow to the testicles.  COMPARISON:  CT abdomen and pelvis earlier same date and 05/28/2014.  A left scrotal hematoma was present on the prior CT 05/28/2014  FINDINGS: Right testicle  Measurements: Approximately 3.9 x 2.4 x 2.7 cm. Normal parenchymal echotexture without mass or microlithiasis. Normal color Doppler flow without evidence of hyperemia.  Left testicle  Measurements: Approximately 3.6 x 2.2 x 3.0 cm. Normal parenchymal echotexture without mass or microlithiasis. Normal color Doppler flow without evidence of hyperemia.  Right epididymis: Normal in size and appearance without evidence of hyperemia.  Left epididymis: Normal in size and appearance without evidence of hyperemia.  Hydrocele:  None visualized.  Varicocele: Moderate-sized left varicocele. No evidence of right varicocele.  Pulsed Doppler interrogation of both testes demonstrates normal low resistance arterial and venous waveforms bilaterally.  IMPRESSION: 1. Normal-appearing bilateral testes and epididymides. Specifically, no evidence of right testicular torsion or epididymo-orchitis. 2. Moderate-sized left varicocele.   Electronically Signed   By: Evangeline Dakin M.D.   On: 01/04/2015 13:16   Dg Chest Port 1 View  01/05/2015   CLINICAL DATA:  Shortness of breath.  Central line placement.  EXAM: PORTABLE CHEST - 1 VIEW  COMPARISON:  01/04/2015  FINDINGS: Interval placement of left central venous catheter. Tip is localized over the cavoatrial junction. No pneumothorax. Mediastinal contours appear intact. Shallow inspiration. Normal heart size and pulmonary vascularity. No focal airspace disease or consolidation in the lungs. No blunting of costophrenic angles.  IMPRESSION: Left central venous catheter with tip over the cavoatrial junction region. No pneumothorax. No evidence of active pulmonary disease.   Electronically Signed   By: Lucienne Capers M.D.   On: 01/05/2015 01:22   Dg Chest Port 1 View  01/04/2015   CLINICAL DATA:  Status post cystoscopy and ureteral stent placement today. Urinary tract infection. Postoperative exam.   EXAM: PORTABLE CHEST - 1 VIEW  COMPARISON:  PA and lateral chest 07/09/2014.  FINDINGS: The lungs are clear. Heart size is normal. No pneumothorax or pleural effusion. Cervical fusion hardware is partially visualized.  IMPRESSION: No acute disease.   Electronically Signed   By: Inge Rise M.D.   On: 01/04/2015 20:24   Ct Renal Stone Study  01/04/2015   CLINICAL DATA:  Testicular pain. RIGHT flank pain. Onset of symptoms at 0600 hr.  EXAM: CT ABDOMEN AND PELVIS WITHOUT CONTRAST  TECHNIQUE: Multidetector CT imaging of the abdomen and pelvis was performed following the standard protocol without IV contrast.  COMPARISON:  05/28/2014.  FINDINGS: Musculoskeletal: L4 through S1 PLIF. No aggressive osseous lesions. Thoracolumbar vertebral body height is preserved.  Lung Bases: Dependent atelectasis.  Extrapleural fat on the LEFT.  Liver: Unenhanced CT was performed per clinician order. Lack of IV contrast limits sensitivity and specificity, especially for evaluation of abdominal/pelvic solid viscera.  Spleen:  Normal.  Gallbladder:  Normal.  Common bile duct:  Normal.  Pancreas:  Normal.  Adrenal glands:  Normal bilaterally.  Kidneys: LEFT kidney and ureter appear normal. No obstruction or inflammatory change.  The RIGHT kidney shows mild perinephric stranding. Minimal ectasia of the calices. Mild ectasia of the RIGHT ureter with proximal perinephric inflammatory changes. 2 mm calculus is present at the RIGHT UVJ.  Stomach:  Small hiatal hernia.  Otherwise normal.  Small bowel:  Normal.  No mesenteric adenopathy.  Colon: Normal appendix. No inflammatory changes of colon. Transverse and descending colon collapsed.  Pelvic Genitourinary: Urinary bladder is mostly collapsed. Probable bilateral varicoceles.  Peritoneum: No free fluid or free air.  Vascular/lymphatic: Mild atherosclerosis.  Body Wall: Normal.  IMPRESSION: 1. 2 mm RIGHT UVJ stone.  No significant hydronephrosis. 2. No residual collecting system calculi.    Electronically Signed   By: Dereck Ligas M.D.   On: 01/04/2015 12:54    Assessment/Plan:   Status post urgent right ureteral stent placement for right distal ureteral stone with pyonephrosis. He is having expected post stent fever. Cultures are pending, he is covered adequately with antibiotic. I would leave him on antibiotic for a proximally 14 days. At this point, he does not need further urologic intervention during this hospitalization. We will call to set up an appointment for him to follow-up in our office to discuss eventual stone removal. I would expect him to be febrile but having decreased fever spikes for the next 2-3 days. I will sign off at this point, but call me back if further urologic opinion needed during this hospitalization. I appreciate the assistance of the inpatient medical service.   LOS: 1 day   Jorja Loa 01/05/2015, 9:51 AM

## 2015-01-05 NOTE — Progress Notes (Signed)
eLink Physician-Brief Progress Note Patient Name: Jeff [name] DOB: 11/06/1965 MRN: [id number]   Date of Service  01/05/2015  HPI/Events of Note  Remains hypotensive with BP = 74/49.  eICU Interventions  Will order: 1. Norepinephrine IV infusion. 2. Place A-line.      Intervention Category Major Interventions: Hypotension - evaluation and management;Shock - evaluation and management;Sepsis - evaluation and management  Sommer,Steven Eugene 01/05/2015, 3:05 AM

## 2015-01-05 NOTE — Progress Notes (Signed)
Patient transported to 2114 via bed on tele, Neo gtt infusing, by Saniya Tranchina, RN and Wes, RR RN. Family at side, belongings sent with family.

## 2015-01-05 NOTE — Progress Notes (Signed)
CRITICAL VALUE ALERT  Critical value received:  LA 6.5  Date of notification:  01/05/15  Time of notification:  0007  Critical value read back:Yes.    Nurse who received alert:  D. Aleene Davidson, RN  MD notified (1st page):  Gaylyn Lambert, NP  Time of first page:  Verbal notification at [place]   MD notified (2nd page):  Time of second page:  Responding MD:  Ilean China, NP  Time MD responded:  0010

## 2015-01-05 NOTE — Consult Note (Signed)
STAFF NOTE: I, Dr Ann Lions have personally reviewed patient's available data, including medical history, events of note, physical examination and test results as part of my evaluation. I have discussed with resident/NP and other care providers such as pharmacist, RN and RRT.  In addition,  I personally evaluated patient and elicited key findings of   S: Admitted 01/04/2015  With UTI septic shock related to stone. Per RN this AM - > he is down to 59mcg levophed. Talking and sitting. Eating. Pharm says that he is on scheduled klonopin at 2pm and ambien QHS for insomnia. HE is frustrated he has not gotten that. He feels ready to leave ICU.Lactate improved a lot     O:   Filed Vitals:   01/05/15 0700 01/05/15 0800 01/05/15 0822 01/05/15 0900  BP: 97/62 105/72  113/73  Pulse: 118 128  119  Temp:   97.8 F (36.6 C)   TempSrc:   Oral   Resp:  30  28  Height:      Weight:      SpO2: 95% 100%  100%    EXAM  - sitting and     PULMONARY  Recent Labs Lab 01/04/15 1334 01/05/15 0200  TCO2 22  --   O2SAT  --  75.1    CBC  Recent Labs Lab 01/04/15 1327 01/04/15 1334 01/04/15 2018 01/05/15 0119  HGB 14.7 16.7 13.4 12.6*  HCT 44.7 49.0 41.7 38.6*  WBC 8.0  --  4.8 14.5*  PLT 144*  --  88* 90*    COAGULATION  Recent Labs Lab 01/05/15 0119  INR 1.46    CARDIAC   Recent Labs Lab 01/05/15 0119 01/05/15 0622  TROPONINI 0.08* 0.13*   No results for input(s): PROBNP in the last 168 hours.   CHEMISTRY  Recent Labs Lab 01/04/15 1334 01/04/15 2018 01/05/15 0119  NA 141 139 143  K 3.8 3.4* 4.3  CL 101 106 112*  CO2  --  19* 19*  GLUCOSE 136* 131* 138*  BUN 7 8 10   CREATININE 1.40* 2.07* 2.07*  CALCIUM  --  8.1* 8.0*  MG  --  0.9* 1.3*   Estimated Creatinine Clearance: 55.8 mL/min (by C-G formula based on Cr of 2.07).   LIVER  Recent Labs Lab 01/05/15 0119  INR 1.46     INFECTIOUS  Recent Labs Lab 01/05/15 0118 01/05/15 0537 01/05/15 0830   LATICACIDVEN 6.5* 6.3* 3.8*     ENDOCRINE CBG (last 3)   Recent Labs  01/05/15 0017  GLUCAP 92         IMAGING x48h  - image(s) personally visualized  -   highlighted in bold US Scrotum  01/04/2015   CLINICAL DATA:  49 year old with acute onset of right scrotal pain which began at 0600 hr earlier today.  EXAM: SCROTAL ULTRASOUND  DOPPLER ULTRASOUND OF THE TESTICLES  TECHNIQUE: Complete ultrasound examination of the testicles, epididymis, and other scrotal structures was performed. Color and spectral Doppler ultrasound were also utilized to evaluate blood flow to the testicles.  COMPARISON:  CT abdomen and pelvis earlier same date and 05/28/2014. A left scrotal hematoma was present on the prior CT 05/28/2014  FINDINGS: Right testicle  Measurements: Approximately 3.9 x 2.4 x 2.7 cm. Normal parenchymal echotexture without mass or microlithiasis. Normal color Doppler flow without evidence of hyperemia.  Left testicle  Measurements: Approximately 3.6 x 2.2 x 3.0 cm. Normal parenchymal echotexture without mass or microlithiasis. Normal color Doppler flow without evidence of hyperemia.  Right epididymis: Normal in size and appearance without evidence of hyperemia.  Left epididymis: Normal in size and appearance without evidence of hyperemia.  Hydrocele:  None visualized.  Varicocele: Moderate-sized left varicocele. No evidence of right varicocele.  Pulsed Doppler interrogation of both testes demonstrates normal low resistance arterial and venous waveforms bilaterally.  IMPRESSION: 1. Normal-appearing bilateral testes and epididymides. Specifically, no evidence of right testicular torsion or epididymo-orchitis. 2. Moderate-sized left varicocele.   Electronically Signed   By: Evangeline Dakin M.D.   On: 01/04/2015 13:16   [place] Art/ven Flow Abd Pelv Doppler  01/04/2015   CLINICAL DATA:  49 year old with acute onset of right scrotal pain which began at 0600 hr earlier today.  EXAM: SCROTAL ULTRASOUND   DOPPLER ULTRASOUND OF THE TESTICLES  TECHNIQUE: Complete ultrasound examination of the testicles, epididymis, and other scrotal structures was performed. Color and spectral Doppler ultrasound were also utilized to evaluate blood flow to the testicles.  COMPARISON:  CT abdomen and pelvis earlier same date and 05/28/2014. A left scrotal hematoma was present on the prior CT 05/28/2014  FINDINGS: Right testicle  Measurements: Approximately 3.9 x 2.4 x 2.7 cm. Normal parenchymal echotexture without mass or microlithiasis. Normal color Doppler flow without evidence of hyperemia.  Left testicle  Measurements: Approximately 3.6 x 2.2 x 3.0 cm. Normal parenchymal echotexture without mass or microlithiasis. Normal color Doppler flow without evidence of hyperemia.  Right epididymis: Normal in size and appearance without evidence of hyperemia.  Left epididymis: Normal in size and appearance without evidence of hyperemia.  Hydrocele:  None visualized.  Varicocele: Moderate-sized left varicocele. No evidence of right varicocele.  Pulsed Doppler interrogation of both testes demonstrates normal low resistance arterial and venous waveforms bilaterally.  IMPRESSION: 1. Normal-appearing bilateral testes and epididymides. Specifically, no evidence of right testicular torsion or epididymo-orchitis. 2. Moderate-sized left varicocele.   Electronically Signed   By: Evangeline Dakin M.D.   On: 01/04/2015 13:16   Dg Chest Port 1 View  01/05/2015   CLINICAL DATA:  Shortness of breath.  Central line placement.  EXAM: PORTABLE CHEST - 1 VIEW  COMPARISON:  01/04/2015  FINDINGS: Interval placement of left central venous catheter. Tip is localized over the cavoatrial junction. No pneumothorax. Mediastinal contours appear intact. Shallow inspiration. Normal heart size and pulmonary vascularity. No focal airspace disease or consolidation in the lungs. No blunting of costophrenic angles.  IMPRESSION: Left central venous catheter with tip over the  cavoatrial junction region. No pneumothorax. No evidence of active pulmonary disease.   Electronically Signed   By: Lucienne Capers M.D.   On: 01/05/2015 01:22   Dg Chest Port 1 View  01/04/2015   CLINICAL DATA:  Status post cystoscopy and ureteral stent placement today. Urinary tract infection. Postoperative exam.  EXAM: PORTABLE CHEST - 1 VIEW  COMPARISON:  PA and lateral chest 07/09/2014.  FINDINGS: The lungs are clear. Heart size is normal. No pneumothorax or pleural effusion. Cervical fusion hardware is partially visualized.  IMPRESSION: No acute disease.   Electronically Signed   By: Inge Rise M.D.   On: 01/04/2015 20:24   Ct Renal Stone Study  01/04/2015   CLINICAL DATA:  Testicular pain. RIGHT flank pain. Onset of symptoms at 0600 hr.  EXAM: CT ABDOMEN AND PELVIS WITHOUT CONTRAST  TECHNIQUE: Multidetector CT imaging of the abdomen and pelvis was performed following the standard protocol without IV contrast.  COMPARISON:  05/28/2014.  FINDINGS: Musculoskeletal: L4 through S1 PLIF. No aggressive osseous lesions. Thoracolumbar vertebral body height  is preserved.  Lung Bases: Dependent atelectasis.  Extrapleural fat on the LEFT.  Liver: Unenhanced CT was performed per clinician order. Lack of IV contrast limits sensitivity and specificity, especially for evaluation of abdominal/pelvic solid viscera.  Spleen:  Normal.  Gallbladder:  Normal.  Common bile duct:  Normal.  Pancreas:  Normal.  Adrenal glands:  Normal bilaterally.  Kidneys: LEFT kidney and ureter appear normal. No obstruction or inflammatory change.  The RIGHT kidney shows mild perinephric stranding. Minimal ectasia of the calices. Mild ectasia of the RIGHT ureter with proximal perinephric inflammatory changes. 2 mm calculus is present at the RIGHT UVJ.  Stomach:  Small hiatal hernia.  Otherwise normal.  Small bowel:  Normal.  No mesenteric adenopathy.  Colon: Normal appendix. No inflammatory changes of colon. Transverse and descending  colon collapsed.  Pelvic Genitourinary: Urinary bladder is mostly collapsed. Probable bilateral varicoceles.  Peritoneum: No free fluid or free air.  Vascular/lymphatic: Mild atherosclerosis.  Body Wall: Normal.  IMPRESSION: 1. 2 mm RIGHT UVJ stone.  No significant hydronephrosis. 2. No residual collecting system calculi.   Electronically Signed   By: Dereck Ligas M.D.   On: 01/04/2015 12:54       A:  Septic shock - improved pressor need -> give more fluid bolus and  Aim to wean of pressors Lactic Acidosis - clearing up -> repeat per proptoc9ol with more fluid bolus UTI - abx - Levaquin. Culture pending Stone -> Rx per urology complete Trop leak - likely stress related-> will get echo Thrombocytopenia - likely sepsis mediated -> for now continue lovenox (no hx of heparinoid in past 30d) but monitor Anxiety/INsomnia - klonopin/ambien home med restart   .  Rest per NP/medical resident whose note is outlined above and that I agree with  The patient is critically ill with multiple organ systems failure and requires high complexity decision making for assessment and support, frequent evaluation and titration of therapies, application of advanced monitoring technologies and extensive interpretation of multiple databases.   Critical Care Time devoted to patient care services described in this note is  35  Minutes. This time reflects time of care of this signee Dr Brand Males. This critical care time does not reflect procedure time, or teaching time or supervisory time of PA/NP/Med student/Med Resident etc but could involve care discussion time    Dr. Brand Males, M.D., [facility].C.P Pulmonary and Critical Care Medicine Staff Physician [facility] Pager: [phone], If no answer or between  15:00h - 7:00h: call [phone]  01/05/2015 9:37 AM

## 2015-01-06 ENCOUNTER — Encounter (HOSPITAL_COMMUNITY): Payer: Self-pay | Admitting: Urology

## 2015-01-06 DIAGNOSIS — R319 Hematuria, unspecified: Secondary | ICD-10-CM

## 2015-01-06 LAB — C DIFFICILE QUICK SCREEN W PCR REFLEX
C Diff antigen: NEGATIVE
C Diff interpretation: NEGATIVE
C Diff toxin: NEGATIVE

## 2015-01-06 LAB — CBC WITH DIFFERENTIAL/PLATELET
Basophils Absolute: 0 10*3/uL (ref 0.0–0.1)
Basophils Relative: 0 % (ref 0–1)
Eosinophils Absolute: 0 10*3/uL (ref 0.0–0.7)
Eosinophils Relative: 0 % (ref 0–5)
HCT: 38.2 % — ABNORMAL LOW (ref 39.0–52.0)
Hemoglobin: 12.6 g/dL — ABNORMAL LOW (ref 13.0–17.0)
Lymphocytes Relative: 9 % — ABNORMAL LOW (ref 12–46)
Lymphs Abs: 1.8 10*3/uL (ref 0.7–4.0)
MCH: 28.2 pg (ref 26.0–34.0)
MCHC: 33 g/dL (ref 30.0–36.0)
MCV: 85.5 fL (ref 78.0–100.0)
Monocytes Absolute: 1.2 10*3/uL — ABNORMAL HIGH (ref 0.1–1.0)
Monocytes Relative: 6 % (ref 3–12)
Neutro Abs: 16.6 10*3/uL — ABNORMAL HIGH (ref 1.7–7.7)
Neutrophils Relative %: 85 % — ABNORMAL HIGH (ref 43–77)
Platelets: 72 10*3/uL — ABNORMAL LOW (ref 150–400)
RBC: 4.47 MIL/uL (ref 4.22–5.81)
RDW: 14 % (ref 11.5–15.5)
WBC Morphology: INCREASED
WBC: 19.6 10*3/uL — ABNORMAL HIGH (ref 4.0–10.5)

## 2015-01-06 LAB — URINE CULTURE: Culture: >=100000

## 2015-01-06 LAB — BASIC METABOLIC PANEL
Anion gap: 8 (ref 5–15)
BUN: 10 mg/dL (ref 6–20)
CO2: 21 mmol/L — ABNORMAL LOW (ref 22–32)
Calcium: 8 mg/dL — ABNORMAL LOW (ref 8.9–10.3)
Chloride: 110 mmol/L (ref 101–111)
Creatinine, Ser: 1.53 mg/dL — ABNORMAL HIGH (ref 0.61–1.24)
GFR calc Af Amer: >60 mL/min (ref >60)
GFR calc non Af Amer: 52 mL/min — ABNORMAL LOW (ref >60)
Glucose, Bld: 82 mg/dL (ref 65–99)
Potassium: 3.5 mmol/L (ref 3.5–5.1)
Sodium: 139 mmol/L (ref 135–145)

## 2015-01-06 LAB — URINALYSIS, ROUTINE W REFLEX MICROSCOPIC
Bilirubin Urine: NEGATIVE
Glucose, UA: NEGATIVE mg/dL
Ketones, ur: 15 mg/dL — AB
Nitrite: NEGATIVE
Protein, ur: 30 mg/dL — AB
Specific Gravity, Urine: 1.01 (ref 1.005–1.030)
Urobilinogen, UA: 0.2 mg/dL (ref 0.0–1.0)
pH: 7 (ref 5.0–8.0)

## 2015-01-06 LAB — URINE MICROSCOPIC-ADD ON

## 2015-01-06 LAB — PHOSPHORUS: Phosphorus: 1.7 mg/dL — ABNORMAL LOW (ref 2.5–4.6)

## 2015-01-06 LAB — MAGNESIUM: Magnesium: 1.4 mg/dL — ABNORMAL LOW (ref 1.7–2.4)

## 2015-01-06 MED ORDER — SODIUM CHLORIDE 0.9 % IJ SOLN
10.0000 mL | INTRAMUSCULAR | Status: DC | PRN
  Administered 2015-01-07: 30 mL
  Filled 2015-01-06: qty 40

## 2015-01-06 MED ORDER — MAGNESIUM SULFATE 4 GM/100ML IV SOLN
4.0000 g | Freq: Once | INTRAVENOUS | Status: AC
  Administered 2015-01-06: 4 g via INTRAVENOUS
  Filled 2015-01-06: qty 100

## 2015-01-06 MED ORDER — DEXTROSE 5 % IV SOLN
24.0000 mmol | Freq: Once | INTRAVENOUS | Status: AC
  Administered 2015-01-06: 24 mmol via INTRAVENOUS
  Filled 2015-01-06: qty 8

## 2015-01-06 MED ORDER — SODIUM CHLORIDE 0.9 % IV BOLUS (SEPSIS)
500.0000 mL | Freq: Once | INTRAVENOUS | Status: AC
  Administered 2015-01-06: 500 mL via INTRAVENOUS

## 2015-01-06 MED ORDER — SODIUM CHLORIDE 0.9 % IJ SOLN: 10.0000 mL | INTRAMUSCULAR | Status: DC | PRN

## 2015-01-06 NOTE — Progress Notes (Signed)
Urine cultures grew Klebsiella, sensitive to cephalosporins, fluoroquinolones. He should be fine on Levaquin.  Oxybutynin will help his overactive bladder symptoms from the stent.  We will follow the patient up after his discharge to discuss eventual ureteroscopic management of this distal stone. I would send him home with a total 10 day course of antibiotic.

## 2015-01-06 NOTE — Progress Notes (Signed)
I called infection prevention to verify patient did not need to be on contact. ID stated [email or web address] only was not reason for contact must have ESBL. Will pass along. Payton Emerald, RN

## 2015-01-06 NOTE — Consult Note (Signed)
PULMONARY / CRITICAL CARE MEDICINE   Name: Jeff [name] MRN: [id number] DOB: Mar 09, 1966    ADMISSION DATE:  01/04/2015 CONSULTATION DATE:  8/22  REFERRING MD : Graciella Freer  CHIEF COMPLAINT:  Rt flank pain  INITIAL PRESENTATION: Rt flank pain  STUDIES:    SIGNIFICANT EVENTS: 8/21 rt double J stent   BRIEF 49 yo AAM with PMH significant for cad , post STEMI 5 years ago, HTN on 2 oral agent, kidney stones  And he reports hematuria x 3 weeks intermittently. He presents to [facility] on 8/21 with intense rt flank pain which he describes as kidney stone pain. He was taken to OR by urology and rt double J stent was placed. Despite 3 litre of fluid his sbp remains less than 90 and his latic acid continues to rise. He is alrt, talkative and appears much better than his numbers.  We will move him to ICU, start on pressors, monitor lactic acid for clearing.    SUBJECTIVE:   01/06/15: Morning rounds - chart review shows -> Tachycardia and got fluids,. Urology signed off with 14d antibiotic rec. FEver curve improved. Urine cutlure with > 100K GNR. Platelets dropped and lovenox stopped. D/w Pharmacist - > patient on Pletal for CAD/PAD. Per RN -> no overnight issues other than tachyardia. Patient wants to move out of floor. Wife denied complaints  VITAL SIGNS: Temp:  [97.8 F (36.6 C)-99.2 F (37.3 C)] 99.1 F (37.3 C) (08/23 0826) Pulse Rate:  [106-211] 119 (08/23 0800) Resp:  [15-39] 28 (08/23 0800) BP: (78-143)/(41-88) 116/74 mmHg (08/23 0800) SpO2:  [91 %-100 %] 99 % (08/23 0800) HEMODYNAMICS:   VENTILATOR SETTINGS:   INTAKE / OUTPUT:  Intake/Output Summary (Last 24 hours) at 01/06/15 0911 Last data filed at 01/06/15 0800  Gross per 24 hour  Intake  322.7 ml  Output   3025 ml  Net -2702.3 ml    PHYSICAL EXAMINATION: General:  WNWD AAM Neuro:  Intact HEENT:  No JVD/LAN , oral mucosa dry Cardiovascular:  HSR RRR. Tachycardic + - responded to fluids Lungs:  CTA Abdomen:  Soft +  BS Musculoskeletal:  Intact Skin:  Warm and dry GU: voids clear urine  LABS:   PULMONARY  Recent Labs Lab 01/04/15 1334 01/05/15 0200  TCO2 22  --   O2SAT  --  75.1    CBC  Recent Labs Lab 01/05/15 0119 01/05/15 1415 01/06/15 0421  HGB 12.6* 11.5* 12.6*  HCT 38.6* 35.1* 38.2*  WBC 14.5* 19.3* 19.6*  PLT 90* 70* 72*    COAGULATION  Recent Labs Lab 01/05/15 0119  INR 1.46    CARDIAC   Recent Labs Lab 01/05/15 0119 01/05/15 0622 01/05/15 1235  TROPONINI 0.08* 0.13* 0.29*   No results for input(s): PROBNP in the last 168 hours.   CHEMISTRY  Recent Labs Lab 01/04/15 1334 01/04/15 2018 01/05/15 0119 01/05/15 1415 01/06/15 0421  NA 141 139 143 141 139  K 3.8 3.4* 4.3 3.8 3.5  CL 101 106 112* 111 110  CO2  --  19* 19* 21* 21*  GLUCOSE 136* 131* 138* 106* 82  BUN 7 8 10 11 10   CREATININE 1.40* 2.07* 2.07* 1.76* 1.53*  CALCIUM  --  8.1* 8.0* 7.9* 8.0*  MG  --  0.9* 1.3* 1.3* 1.4*  PHOS  --   --   --  1.4* 1.7*   Estimated Creatinine Clearance: 75.5 mL/min (by C-G formula based on Cr of 1.53).   LIVER  Recent Labs Lab 01/05/15  0119  INR 1.46     INFECTIOUS  Recent Labs Lab 01/05/15 0537 01/05/15 0830 01/05/15 1415  LATICACIDVEN 6.3* 3.8* 2.4*     ENDOCRINE CBG (last 3)   Recent Labs  01/05/15 0017  GLUCAP 92         IMAGING x48h  - image(s) personally visualized  -   highlighted in bold US Scrotum  01/04/2015   CLINICAL DATA:  49 year old with acute onset of right scrotal pain which began at 0600 hr earlier today.  EXAM: SCROTAL ULTRASOUND  DOPPLER ULTRASOUND OF THE TESTICLES  TECHNIQUE: Complete ultrasound examination of the testicles, epididymis, and other scrotal structures was performed. Color and spectral Doppler ultrasound were also utilized to evaluate blood flow to the testicles.  COMPARISON:  CT abdomen and pelvis earlier same date and 05/28/2014. A left scrotal hematoma was present on the prior CT 05/28/2014   FINDINGS: Right testicle  Measurements: Approximately 3.9 x 2.4 x 2.7 cm. Normal parenchymal echotexture without mass or microlithiasis. Normal color Doppler flow without evidence of hyperemia.  Left testicle  Measurements: Approximately 3.6 x 2.2 x 3.0 cm. Normal parenchymal echotexture without mass or microlithiasis. Normal color Doppler flow without evidence of hyperemia.  Right epididymis: Normal in size and appearance without evidence of hyperemia.  Left epididymis: Normal in size and appearance without evidence of hyperemia.  Hydrocele:  None visualized.  Varicocele: Moderate-sized left varicocele. No evidence of right varicocele.  Pulsed Doppler interrogation of both testes demonstrates normal low resistance arterial and venous waveforms bilaterally.  IMPRESSION: 1. Normal-appearing bilateral testes and epididymides. Specifically, no evidence of right testicular torsion or epididymo-orchitis. 2. Moderate-sized left varicocele.   Electronically Signed   By: Evangeline Dakin M.D.   On: 01/04/2015 13:16   [place] Art/ven Flow Abd Pelv Doppler  01/04/2015   CLINICAL DATA:  49 year old with acute onset of right scrotal pain which began at 0600 hr earlier today.  EXAM: SCROTAL ULTRASOUND  DOPPLER ULTRASOUND OF THE TESTICLES  TECHNIQUE: Complete ultrasound examination of the testicles, epididymis, and other scrotal structures was performed. Color and spectral Doppler ultrasound were also utilized to evaluate blood flow to the testicles.  COMPARISON:  CT abdomen and pelvis earlier same date and 05/28/2014. A left scrotal hematoma was present on the prior CT 05/28/2014  FINDINGS: Right testicle  Measurements: Approximately 3.9 x 2.4 x 2.7 cm. Normal parenchymal echotexture without mass or microlithiasis. Normal color Doppler flow without evidence of hyperemia.  Left testicle  Measurements: Approximately 3.6 x 2.2 x 3.0 cm. Normal parenchymal echotexture without mass or microlithiasis. Normal color Doppler flow without  evidence of hyperemia.  Right epididymis: Normal in size and appearance without evidence of hyperemia.  Left epididymis: Normal in size and appearance without evidence of hyperemia.  Hydrocele:  None visualized.  Varicocele: Moderate-sized left varicocele. No evidence of right varicocele.  Pulsed Doppler interrogation of both testes demonstrates normal low resistance arterial and venous waveforms bilaterally.  IMPRESSION: 1. Normal-appearing bilateral testes and epididymides. Specifically, no evidence of right testicular torsion or epididymo-orchitis. 2. Moderate-sized left varicocele.   Electronically Signed   By: Evangeline Dakin M.D.   On: 01/04/2015 13:16   Dg Chest Port 1 View  01/05/2015   CLINICAL DATA:  Shortness of breath.  Central line placement.  EXAM: PORTABLE CHEST - 1 VIEW  COMPARISON:  01/04/2015  FINDINGS: Interval placement of left central venous catheter. Tip is localized over the cavoatrial junction. No pneumothorax. Mediastinal contours appear intact. Shallow inspiration. Normal heart size and  pulmonary vascularity. No focal airspace disease or consolidation in the lungs. No blunting of costophrenic angles.  IMPRESSION: Left central venous catheter with tip over the cavoatrial junction region. No pneumothorax. No evidence of active pulmonary disease.   Electronically Signed   By: Lucienne Capers M.D.   On: 01/05/2015 01:22   Dg Chest Port 1 View  01/04/2015   CLINICAL DATA:  Status post cystoscopy and ureteral stent placement today. Urinary tract infection. Postoperative exam.  EXAM: PORTABLE CHEST - 1 VIEW  COMPARISON:  PA and lateral chest 07/09/2014.  FINDINGS: The lungs are clear. Heart size is normal. No pneumothorax or pleural effusion. Cervical fusion hardware is partially visualized.  IMPRESSION: No acute disease.   Electronically Signed   By: Inge Rise M.D.   On: 01/04/2015 20:24   Ct Renal Stone Study  01/04/2015   CLINICAL DATA:  Testicular pain. RIGHT flank pain.  Onset of symptoms at 0600 hr.  EXAM: CT ABDOMEN AND PELVIS WITHOUT CONTRAST  TECHNIQUE: Multidetector CT imaging of the abdomen and pelvis was performed following the standard protocol without IV contrast.  COMPARISON:  05/28/2014.  FINDINGS: Musculoskeletal: L4 through S1 PLIF. No aggressive osseous lesions. Thoracolumbar vertebral body height is preserved.  Lung Bases: Dependent atelectasis.  Extrapleural fat on the LEFT.  Liver: Unenhanced CT was performed per clinician order. Lack of IV contrast limits sensitivity and specificity, especially for evaluation of abdominal/pelvic solid viscera.  Spleen:  Normal.  Gallbladder:  Normal.  Common bile duct:  Normal.  Pancreas:  Normal.  Adrenal glands:  Normal bilaterally.  Kidneys: LEFT kidney and ureter appear normal. No obstruction or inflammatory change.  The RIGHT kidney shows mild perinephric stranding. Minimal ectasia of the calices. Mild ectasia of the RIGHT ureter with proximal perinephric inflammatory changes. 2 mm calculus is present at the RIGHT UVJ.  Stomach:  Small hiatal hernia.  Otherwise normal.  Small bowel:  Normal.  No mesenteric adenopathy.  Colon: Normal appendix. No inflammatory changes of colon. Transverse and descending colon collapsed.  Pelvic Genitourinary: Urinary bladder is mostly collapsed. Probable bilateral varicoceles.  Peritoneum: No free fluid or free air.  Vascular/lymphatic: Mild atherosclerosis.  Body Wall: Normal.  IMPRESSION: 1. 2 mm RIGHT UVJ stone.  No significant hydronephrosis. 2. No residual collecting system calculi.   Electronically Signed   By: Dereck Ligas M.D.   On: 01/04/2015 12:54        ASSESSMENT / PLAN:  PULMONARY OETT A: No acute issue P:   Monitor  CARDIOVASCULAR CVL 8/22 Lt I J CVL>>   Recent Labs Lab 01/05/15 0119 01/05/15 0622 01/05/15 1235  TROPONINI 0.08* 0.13* 0.29*    A:  Shock sepsis from presumed renal source CAD post MI age 43 HTN  - off levophed x > 12h. Good normal  ECHo 01/05/15. Trop leak likely demand. Trachyarcdic - respoinds to fluids  P:  DC Sepsis protocol and levophe dfrom MAR Fluid bolus for tachycardia and monitor     RENAL Lab Results  Component Value Date   CREATININE 1.53* 01/06/2015   CREATININE 1.76* 01/05/2015   CREATININE 2.07* 01/05/2015    A:  Rt kidney stone Rt double J stent 8/21 Rising creatine   - improving renal function. 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 IMTS primary from 01/07/15 and PCCM off. Fluids for tachycardia   Dr. Brand Males, M.D., [facility].C.P Pulmonary and Critical Care Medicine Staff Physician [facility] Pager: [phone], If no answer or between  15:00h - 7:00h: call [phone]  01/06/2015 9:25 AM

## 2015-01-06 NOTE — Progress Notes (Addendum)
                                                        ICU Transfer Note .  Summary of Events-  49 Y O M with PMH of HTN, PAD, nephrolithiasis, and hypothyroidism s/p RAI, was admitted to IMTS- 01/04/2015 with Flank pain, hematuria, hypotensive, tachycardia, with UA suggestive of UTI, lactic acidosis-3.4, managed for Septic shock, Source- Pyelonephritis. Pt had Cystoscopy and stents placed in right ureter, by Urology on admission. Pt was aggresively hydrated, and ceftriazone was switched to Levaquin, despite this, Lactic acid continued to trend up and pt remained hypotensive. Pt was therefore transferred to ICU and started on pressors- Neosynephrine and levofed. Pressors were d/c 01/05/2015, pt presently normotensive, slightly tachycardic, WBC- still elevated at 19.6, Lactic acid- trended down from peak- 6.5 to 2.4. Urology recommends 14 days of antibiotics, will follow up as an outpt for stone removal, and has signed off. Pt is been transferred from ICU to tele, presently on Po levaquin, tolerating regular diet, off IVF, last fever spike- 01/04/2015, Cr improving at 1.5 today, blood cultures so far no growth, urine cultures- 01/04/2015- >100 000 colonies of GNR, speciation and sensitivities pending . IMTS was called to assume care starting 01/07/2015.    LOS: 2 days   Bethena Roys, MD 01/06/2015, 10:12 AM

## 2015-01-07 DIAGNOSIS — A419 Sepsis, unspecified organism: Principal | ICD-10-CM

## 2015-01-07 DIAGNOSIS — R6521 Severe sepsis with septic shock: Secondary | ICD-10-CM

## 2015-01-07 DIAGNOSIS — D696 Thrombocytopenia, unspecified: Secondary | ICD-10-CM

## 2015-01-07 DIAGNOSIS — N390 Urinary tract infection, site not specified: Secondary | ICD-10-CM

## 2015-01-07 DIAGNOSIS — N201 Calculus of ureter: Secondary | ICD-10-CM

## 2015-01-07 DIAGNOSIS — B961 Klebsiella pneumoniae [K. pneumoniae] as the cause of diseases classified elsewhere: Secondary | ICD-10-CM

## 2015-01-07 LAB — BASIC METABOLIC PANEL
Anion gap: 8 (ref 5–15)
BUN: 7 mg/dL (ref 6–20)
CO2: 22 mmol/L (ref 22–32)
Calcium: 8 mg/dL — ABNORMAL LOW (ref 8.9–10.3)
Chloride: 107 mmol/L (ref 101–111)
Creatinine, Ser: 1.25 mg/dL — ABNORMAL HIGH (ref 0.61–1.24)
GFR calc Af Amer: >60 mL/min (ref >60)
GFR calc non Af Amer: >60 mL/min (ref >60)
Glucose, Bld: 112 mg/dL — ABNORMAL HIGH (ref 65–99)
Potassium: 3.5 mmol/L (ref 3.5–5.1)
Sodium: 137 mmol/L (ref 135–145)

## 2015-01-07 LAB — CBC WITH DIFFERENTIAL/PLATELET
Basophils Absolute: 0 10*3/uL (ref 0.0–0.1)
Basophils Relative: 0 % (ref 0–1)
Eosinophils Absolute: 0 10*3/uL (ref 0.0–0.7)
Eosinophils Relative: 0 % (ref 0–5)
HCT: 37.8 % — ABNORMAL LOW (ref 39.0–52.0)
Hemoglobin: 12.6 g/dL — ABNORMAL LOW (ref 13.0–17.0)
Lymphocytes Relative: 10 % — ABNORMAL LOW (ref 12–46)
Lymphs Abs: 1.5 10*3/uL (ref 0.7–4.0)
MCH: 28.1 pg (ref 26.0–34.0)
MCHC: 33.3 g/dL (ref 30.0–36.0)
MCV: 84.4 fL (ref 78.0–100.0)
Monocytes Absolute: 1 10*3/uL (ref 0.1–1.0)
Monocytes Relative: 7 % (ref 3–12)
Neutro Abs: 12 10*3/uL — ABNORMAL HIGH (ref 1.7–7.7)
Neutrophils Relative %: 83 % — ABNORMAL HIGH (ref 43–77)
Platelets: 78 10*3/uL — ABNORMAL LOW (ref 150–400)
RBC: 4.48 MIL/uL (ref 4.22–5.81)
RDW: 13.9 % (ref 11.5–15.5)
WBC: 14.5 10*3/uL — ABNORMAL HIGH (ref 4.0–10.5)

## 2015-01-07 LAB — LACTIC ACID, PLASMA: Lactic Acid, Venous: 1.2 mmol/L (ref 0.5–2.0)

## 2015-01-07 LAB — MAGNESIUM: Magnesium: 1.8 mg/dL (ref 1.7–2.4)

## 2015-01-07 LAB — PHOSPHORUS: Phosphorus: 2.5 mg/dL (ref 2.5–4.6)

## 2015-01-07 MED ORDER — OXYBUTYNIN CHLORIDE 5 MG PO TABS: 5.0000 mg | ORAL_TABLET | Freq: Three times a day (TID) | ORAL | Status: DC | PRN

## 2015-01-07 MED ORDER — LEVOFLOXACIN 500 MG PO TABS: 500.0000 mg | ORAL_TABLET | Freq: Every day | ORAL | Status: AC

## 2015-01-07 NOTE — Discharge Summary (Signed)
Name: Jeff [name] MRN: [id number] DOB: 11-06-1965 49 y.o. PCP: Lillard Anes, MD  Date of Admission: 01/04/2015 11:12 AM Date of Discharge: 01/07/2015 Attending Physician: Bartholomew Crews, MD  Discharge Diagnosis: Urinary Tract Infection with Septic Shock Secondary to Ureterolithiasis Thrombocytopenia  Discharge Medications:   Medication List    STOP taking these medications        aspirin EC 81 MG tablet     meclizine 25 MG tablet  Commonly known as:  ANTIVERT     sildenafil 25 MG tablet  Commonly known as:  VIAGRA      TAKE these medications        amLODipine 5 MG tablet  Commonly known as:  NORVASC  TAKE 1 TABLET (5 MG TOTAL) BY MOUTH DAILY.     atorvastatin 40 MG tablet  Commonly known as:  LIPITOR  Take 40 mg by mouth at bedtime.     cilostazol 100 MG tablet  Commonly known as:  PLETAL  TAKE 1 TABLET BY MOUTH TWICE DAILY.     clonazePAM 2 MG tablet  Commonly known as:  KLONOPIN  Take 2 mg by mouth at bedtime. Pt takes at 2pm     escitalopram 20 MG tablet  Commonly known as:  LEXAPRO  Take 20 mg by mouth daily.     levofloxacin 500 MG tablet  Commonly known as:  LEVAQUIN  Take 1 tablet (500 mg total) by mouth daily.     levothyroxine 100 MCG tablet  Commonly known as:  SYNTHROID, LEVOTHROID  Take 1 tablet (100 mcg total) by mouth daily before breakfast.     Magnesium 250 MG Tabs  Take 250 mg by mouth daily.     metoprolol 50 MG tablet  Commonly known as:  LOPRESSOR  Take 0.5 tablets (25 mg total) by mouth 2 (two) times daily.     nitroGLYCERIN 0.4 MG SL tablet  Commonly known as:  NITROSTAT  Place 1 tablet (0.4 mg total) under the tongue every 5 (five) minutes as needed for chest pain.     omega-3 acid ethyl esters 1 G capsule  Commonly known as:  LOVAZA  Take 1 g by mouth daily.     omeprazole 40 MG capsule  Commonly known as:  PRILOSEC  Take 40 mg by mouth daily.     oxybutynin 5 MG tablet  Commonly known as:   DITROPAN  Take 1 tablet (5 mg total) by mouth every 8 (eight) hours as needed for bladder spasms.     oxyCODONE-acetaminophen 10-325 MG per tablet  Commonly known as:  PERCOCET  Take 1 tablet by mouth every 8 (eight) hours as needed for pain (for back pain).     Potassium Gluconate 595 MG Caps  Take 1 capsule by mouth daily.     zolpidem 10 MG tablet  Commonly known as:  AMBIEN  Take 10 mg by mouth at bedtime as needed. For sleep        Disposition and follow-up:   Jeff [name] was discharged from [facility] in Good condition.  At the hospital follow up visit please address:  1.  Jeff [name] was hospitalized with sepsis due to a pyelonephritis secondary to ureterolithiasis. He had a stent placed in his right ureter. He needs follow-up on his urinary symptoms and whether the newly prescribed oxybutynin is effective. He is also on a 14 day course of levaquin, last dose on September 4.  2. We held his aspirin  in the setting of thrombocytopenia, which may be resumed at the PCPs discretion on August 29.  3.  Labs / imaging needed at time of follow-up: He was noted to have thrombocytopenia to the 70s while hospitalized, on discharge, they improving, but were 78. He also an AKI. Therefore, he needs a repeat CBC and basic metabolic panel at his followup visit on August 29th.  4.  Pending labs/ test needing follow-up: None  Follow-up Appointments:     Follow-up Information    Follow up with DAHLSTEDT, Lillette Boxer, MD.   Specialty:  Urology   Why:  Call for appointment to be seen following your discharge to discuss stone management   Contact information:   [place] [address] [phone]       Follow up with LAKE, Suzzette Righter, PA-C. Go on 01/12/2015.   Specialty:  Allergy   Why:  1:30 pm appointment. Hospital Followup.   Contact information:   [place] [address] [phone]       Discharge Instructions: Jeff [name], you  were in the hospital for a stone from your kidneys with led to an infection. You are being treated and are improving on antibiotics. It is very important that you continue to take these antibiotics (Levaquin or Levofloxacin), when you leave the hospital so that you can continue to improve. You will take this antibiotics once a day, and your last day of taking it will be September 4 to complete a 14 day course. If you notice worsening back pain, a temperature >100.4, or chills, please seek immediate medical attention.  While you were in the hospital your platelets, which helps your blood clot, went down. On the day you left the hospital, these were improving, but they need to be checked at you clinic appointment on August 29. Because of your low platelets, you should STOP taking aspirin at least until your clinic visit on August 29. We are also leaving it to your healthcare provider on whether or not to restart the sildenafil. If you or your family notice you are suddenly weak or lightheaded, you be become short of breath when walking, or you notice any new bleeding, you should seek immediate medical attention.  I have scheduled an appointment for you at [facility] with [place]. While he's not your regular doctor, it was more important to obtain an earlier appointment. This will be at the [facility] office at 1:30. You should be getting blood tests at this visit.   Consultations: Dr. Franchot Gallo, Urology  Procedures Performed:  US Scrotum  01/04/2015   CLINICAL DATA:  49 year old with acute onset of right scrotal pain which began at 0600 hr earlier today.  EXAM: SCROTAL ULTRASOUND  DOPPLER ULTRASOUND OF THE TESTICLES  TECHNIQUE: Complete ultrasound examination of the testicles, epididymis, and other scrotal structures was performed. Color and spectral Doppler ultrasound were also utilized to evaluate blood flow to the testicles.  COMPARISON:  CT abdomen and pelvis earlier same date  and 05/28/2014. A left scrotal hematoma was present on the prior CT 05/28/2014  FINDINGS: Right testicle  Measurements: Approximately 3.9 x 2.4 x 2.7 cm. Normal parenchymal echotexture without mass or microlithiasis. Normal color Doppler flow without evidence of hyperemia.  Left testicle  Measurements: Approximately 3.6 x 2.2 x 3.0 cm. Normal parenchymal echotexture without mass or microlithiasis. Normal color Doppler flow without evidence of hyperemia.  Right epididymis: Normal in size and appearance without evidence of hyperemia.  Left epididymis: Normal in size and appearance without evidence of hyperemia.  Hydrocele:  None visualized.  Varicocele: Moderate-sized left varicocele. No evidence of right varicocele.  Pulsed Doppler interrogation of both testes demonstrates normal low resistance arterial and venous waveforms bilaterally.  IMPRESSION: 1. Normal-appearing bilateral testes and epididymides. Specifically, no evidence of right testicular torsion or epididymo-orchitis. 2. Moderate-sized left varicocele.   Electronically Signed   By: Evangeline Dakin M.D.   On: 01/04/2015 13:16   [place] Art/ven Flow Abd Pelv Doppler  01/04/2015   CLINICAL DATA:  49 year old with acute onset of right scrotal pain which began at 0600 hr earlier today.  EXAM: SCROTAL ULTRASOUND  DOPPLER ULTRASOUND OF THE TESTICLES  TECHNIQUE: Complete ultrasound examination of the testicles, epididymis, and other scrotal structures was performed. Color and spectral Doppler ultrasound were also utilized to evaluate blood flow to the testicles.  COMPARISON:  CT abdomen and pelvis earlier same date and 05/28/2014. A left scrotal hematoma was present on the prior CT 05/28/2014  FINDINGS: Right testicle  Measurements: Approximately 3.9 x 2.4 x 2.7 cm. Normal parenchymal echotexture without mass or microlithiasis. Normal color Doppler flow without evidence of hyperemia.  Left testicle  Measurements: Approximately 3.6 x 2.2 x 3.0 cm. Normal parenchymal  echotexture without mass or microlithiasis. Normal color Doppler flow without evidence of hyperemia.  Right epididymis: Normal in size and appearance without evidence of hyperemia.  Left epididymis: Normal in size and appearance without evidence of hyperemia.  Hydrocele:  None visualized.  Varicocele: Moderate-sized left varicocele. No evidence of right varicocele.  Pulsed Doppler interrogation of both testes demonstrates normal low resistance arterial and venous waveforms bilaterally.  IMPRESSION: 1. Normal-appearing bilateral testes and epididymides. Specifically, no evidence of right testicular torsion or epididymo-orchitis. 2. Moderate-sized left varicocele.   Electronically Signed   By: Evangeline Dakin M.D.   On: 01/04/2015 13:16   Dg Chest Port 1 View  01/05/2015   CLINICAL DATA:  Shortness of breath.  Central line placement.  EXAM: PORTABLE CHEST - 1 VIEW  COMPARISON:  01/04/2015  FINDINGS: Interval placement of left central venous catheter. Tip is localized over the cavoatrial junction. No pneumothorax. Mediastinal contours appear intact. Shallow inspiration. Normal heart size and pulmonary vascularity. No focal airspace disease or consolidation in the lungs. No blunting of costophrenic angles.  IMPRESSION: Left central venous catheter with tip over the cavoatrial junction region. No pneumothorax. No evidence of active pulmonary disease.   Electronically Signed   By: Lucienne Capers M.D.   On: 01/05/2015 01:22   Dg Chest Port 1 View  01/04/2015   CLINICAL DATA:  Status post cystoscopy and ureteral stent placement today. Urinary tract infection. Postoperative exam.  EXAM: PORTABLE CHEST - 1 VIEW  COMPARISON:  PA and lateral chest 07/09/2014.  FINDINGS: The lungs are clear. Heart size is normal. No pneumothorax or pleural effusion. Cervical fusion hardware is partially visualized.  IMPRESSION: No acute disease.   Electronically Signed   By: Inge Rise M.D.   On: 01/04/2015 20:24   Ct Renal  Stone Study  01/04/2015   CLINICAL DATA:  Testicular pain. RIGHT flank pain. Onset of symptoms at 0600 hr.  EXAM: CT ABDOMEN AND PELVIS WITHOUT CONTRAST  TECHNIQUE: Multidetector CT imaging of the abdomen and pelvis was performed following the standard protocol without IV contrast.  COMPARISON:  05/28/2014.  FINDINGS: Musculoskeletal: L4 through S1 PLIF. No aggressive osseous lesions. Thoracolumbar vertebral body height is preserved.  Lung Bases: Dependent atelectasis.  Extrapleural fat on the  LEFT.  Liver: Unenhanced CT was performed per clinician order. Lack of IV contrast limits sensitivity and specificity, especially for evaluation of abdominal/pelvic solid viscera.  Spleen:  Normal.  Gallbladder:  Normal.  Common bile duct:  Normal.  Pancreas:  Normal.  Adrenal glands:  Normal bilaterally.  Kidneys: LEFT kidney and ureter appear normal. No obstruction or inflammatory change.  The RIGHT kidney shows mild perinephric stranding. Minimal ectasia of the calices. Mild ectasia of the RIGHT ureter with proximal perinephric inflammatory changes. 2 mm calculus is present at the RIGHT UVJ.  Stomach:  Small hiatal hernia.  Otherwise normal.  Small bowel:  Normal.  No mesenteric adenopathy.  Colon: Normal appendix. No inflammatory changes of colon. Transverse and descending colon collapsed.  Pelvic Genitourinary: Urinary bladder is mostly collapsed. Probable bilateral varicoceles.  Peritoneum: No free fluid or free air.  Vascular/lymphatic: Mild atherosclerosis.  Body Wall: Normal.  IMPRESSION: 1. 2 mm RIGHT UVJ stone.  No significant hydronephrosis. 2. No residual collecting system calculi.   Electronically Signed   By: Dereck Ligas M.D.   On: 01/04/2015 12:54    Admission HPI: Jeff [name] is 49 year old man with a past medical history of HTN, nephrolithiasis, and hypothyroidism s/p RAI who presented with right sided flank pain. It started this morning after he woke up, and he recognized it immediately as the pain he  felt with kidney stones before. He describes it as right flank pain that radiates into his groin and it is associated with nausea and vomiting. He had a bowel movement today that was normal. He reports no fever, but has had some chills today. Over the past week, he has endorsed some hematuria. He denied any chest pain, shortness of breath, rectal pain, other abdominal pain or numbness or weakness. In the ED, he was noted that his urine appeared "infected." to be tachycardic to the 130s, hypotensive to 87/42. His temperature on admission was 97.6. UA showed pyuria that was nitrite positive, and creatinine elevated 1.4 from baseline of 1.2. Lactic acid was 3.4. There was no leukocytosis. Blood and urine cultures were sent, and he was given a dose of IV ceftriaxone. In the setting of his infection, he was emergently taken to the OR by urology for stone removal and R ureteral stent placement. Dopplers of the testes were normal.  Hospital Course by Problem List:   Urinary Tract Infection with Septic Shock Secondary to Ureterolithiasis: After stent placement, in the setting of hypotension, lactic acidosis to 3.4, tachycardia to the 130s, and temperature >103, and persistent hypotension despite aggressive hydration. Jeff [name] was transferred to the ICU on 8/21 and started on pressors neosynephrine and levophed. He was aggressively hydrated. Prior to the transfer, Jeff [name] was transitioned from IV ceftriaxone to Levaquin, with urology recommended a total 14 day course. Pressors were discontinued on 8/22, and while he remained normotensive, he remained tachycardic with a leukocytosis. Lactic acid started to downtrend from a peak of 6.5 to 2.4 on 8/22. Blood cultures showed no growth, but urine cultures showed Klebsiella >100K CFUs that were sensitive to ciprofloxacin. He was transferred out of the ICU to the Internal Medicine Teaching Service, which assumed care on 8/24. On 8/24, he was afebrile. Leukocytosis was  resolving with WBC of 14.5 (down from 19 on 8/23). Lactate has reduced from 2.4 to 1.2 on 8/24. While he has a mild tachycardia to 108 today, there is no hypotension. He was discharged on his 14 day course of Levaquin as well  as oxybutynin for bladder spasms, per urology recommendations.  Thrombocytopenia: On admission, platelets were near baseline of 150-170 at 144. Shortly thereafter, they decreased to 88, reaching a nadir of 7 on 8/22. With the resolution of his septic shock, these uptrended to 72 on 8/23 and 78 on 8/24. This was likely due to septic shock. We informed the patient on 8/24 that he should have this checked at his followup clinic visit.  Acute Kidney Injury: Likely due to intrinsic injury due to BUN/Cr < 10. Continued to improve after stenting and antibiotic therapy. Nearly resolved on 8/24, reaching 1.25, near baseline of 1.2.  Hypothyroidism s/p RAI: Continued on levothyroxine. TSH was 1.885 (nl) on 8/21.  Peripheral Artery Disease: Occluded left popliteal artery. Continued cilostazol.  HTN: Held home amlodipine and lopressor this evening in setting of hypotension. Toward this end of his hospitalization, he had BPs reaching 150/79. Will resume home meds on discharge.  HLD: Continued atorvastatin. Held ASA in setting of thrombocytopenia  Depression vs. Anxiety:Discharged on Lexapro 20mg  daily, Clonazepam prn anxiety  Insomnia: Discharged on Ambien prn  GERD: On Prilosec at home. On protonix while hospitalized.  Discharge Vitals:   BP 111/66 mmHg  Pulse 108  Temp(Src) 98.9 F (37.2 C) (Oral)  Resp 18  Ht 5\' 10"  (1.778 m)  Wt 257 lb 1.6 oz (116.62 kg)  BMI 36.89 kg/m2  SpO2 97%  Discharge Labs:  Results for orders placed or performed during the hospital encounter of 01/04/15 (from the past 24 hour(s))  CBC with Differential/Platelet     Status: Abnormal   Collection Time: 01/07/15  4:25 AM  Result Value Ref Range   WBC 14.5 (H) 4.0 - 10.5 K/uL   RBC 4.48  4.22 - 5.81 MIL/uL   Hemoglobin 12.6 (L) 13.0 - 17.0 g/dL   HCT 37.8 (L) 39.0 - 52.0 %   MCV 84.4 78.0 - 100.0 fL   MCH 28.1 26.0 - 34.0 pg   MCHC 33.3 30.0 - 36.0 g/dL   RDW 13.9 11.5 - 15.5 %   Platelets 78 (L) 150 - 400 K/uL   Neutrophils Relative % 83 (H) 43 - 77 %   Neutro Abs 12.0 (H) 1.7 - 7.7 K/uL   Lymphocytes Relative 10 (L) 12 - 46 %   Lymphs Abs 1.5 0.7 - 4.0 K/uL   Monocytes Relative 7 3 - 12 %   Monocytes Absolute 1.0 0.1 - 1.0 K/uL   Eosinophils Relative 0 0 - 5 %   Eosinophils Absolute 0.0 0.0 - 0.7 K/uL   Basophils Relative 0 0 - 1 %   Basophils Absolute 0.0 0.0 - 0.1 K/uL  Basic metabolic panel     Status: Abnormal   Collection Time: 01/07/15  4:25 AM  Result Value Ref Range   Sodium 137 135 - 145 mmol/L   Potassium 3.5 3.5 - 5.1 mmol/L   Chloride 107 101 - 111 mmol/L   CO2 22 22 - 32 mmol/L   Glucose, Bld 112 (H) 65 - 99 mg/dL   BUN 7 6 - 20 mg/dL   Creatinine, Ser 1.25 (H) 0.61 - 1.24 mg/dL   Calcium 8.0 (L) 8.9 - 10.3 mg/dL   GFR calc non Af Amer >60 >60 mL/min   GFR calc Af Amer >60 >60 mL/min   Anion gap 8 5 - 15  Magnesium     Status: None   Collection Time: 01/07/15  4:25 AM  Result Value Ref Range   Magnesium 1.8 1.7 - 2.4  mg/dL  Phosphorus     Status: None   Collection Time: 01/07/15  4:25 AM  Result Value Ref Range   Phosphorus 2.5 2.5 - 4.6 mg/dL  Lactic acid, plasma     Status: None   Collection Time: 01/07/15  4:25 AM  Result Value Ref Range   Lactic Acid, Venous 1.2 0.5 - 2.0 mmol/L    Signed: Liberty Handy, MD 01/07/2015, 11:10 AM

## 2015-01-07 NOTE — Progress Notes (Signed)
Subjective:  Jeff [name] was seen and examined this morning. He said he had some residual back pain with urination, but it is much improved from previously. He said he is ready to go home today.  Objective: Vital signs in last 24 hours: Filed Vitals:   01/06/15 1702 01/06/15 2007 01/06/15 2217 01/07/15 0457  BP: 138/68 150/79  111/66  Pulse: 114 114  108  Temp: 97.7 F (36.5 C) 100.2 F (37.9 C) 99.6 F (37.6 C) 98.9 F (37.2 C)  TempSrc: Oral Oral Oral Oral  Resp: 21 20  18   Height:      Weight:      SpO2: 96% 95%  97%    Physical Exam: General:  Lying on side in bed in no acute distress Neck:  Left IJ in place, c/d/i Cardiovascular: RRR, no murmurs. Normal S1 and S2 Pulmonary: Clear to auscultation bilaterally Abdomen:  Normal bowel sounds, soft non-tender non-distended Back: Mild tenderness to palpation of right lower back. Neuro:  Alert and cooperative to exam  Lab Results: Basic Metabolic Panel:  Recent Labs Lab 01/06/15 0421 01/07/15 0425  NA 139 137  K 3.5 3.5  CL 110 107  CO2 21* 22  GLUCOSE 82 112*  BUN 10 7  CREATININE 1.53* 1.25*  CALCIUM 8.0* 8.0*  MG 1.4* 1.8  PHOS 1.7* 2.5   CBC:  Recent Labs Lab 01/06/15 0421 01/07/15 0425  WBC 19.6* 14.5*  NEUTROABS 16.6* 12.0*  HGB 12.6* 12.6*  HCT 38.2* 37.8*  MCV 85.5 84.4  PLT 72* 78*    CBG:  Recent Labs Lab 01/05/15 0017  GLUCAP 92   Urinalysis:  Recent Labs Lab 01/04/15 1200 01/06/15 0547  COLORURINE YELLOW RED*  LABSPEC 1.018 1.010  PHURINE 7.0 7.0  GLUCOSEU NEGATIVE NEGATIVE  HGBUR LARGE* LARGE*  BILIRUBINUR NEGATIVE NEGATIVE  KETONESUR NEGATIVE 15*  PROTEINUR NEGATIVE 30*  UROBILINOGEN 0.2 0.2  NITRITE POSITIVE* NEGATIVE  LEUKOCYTESUR MODERATE* SMALL*   Lactic Acid, Venous:  8/24: 1.2;  8/22: 2.4  Micro Results: Recent Results (from the past 240 hour(s))  Urine culture     Status: None   Collection Time: 01/04/15 12:00 PM  Result Value Ref Range Status   Specimen Description URINE, RANDOM  Final   Special Requests NONE  Final   Culture >=100,000 COLONIES/mL KLEBSIELLA PNEUMONIAE  Final   Report Status 01/06/2015 FINAL  Final   Organism ID, Bacteria KLEBSIELLA PNEUMONIAE  Final      Susceptibility   Klebsiella pneumoniae - MIC*    AMPICILLIN >=32 RESISTANT Resistant     CEFAZOLIN <=4 SENSITIVE Sensitive     CEFTRIAXONE <=1 SENSITIVE Sensitive     CIPROFLOXACIN <=0.25 SENSITIVE Sensitive     GENTAMICIN <=1 SENSITIVE Sensitive     IMIPENEM <=0.25 SENSITIVE Sensitive     NITROFURANTOIN 64 INTERMEDIATE Intermediate     TRIMETH/SULFA >=320 RESISTANT Resistant     AMPICILLIN/SULBACTAM 4 SENSITIVE Sensitive     PIP/TAZO <=4 SENSITIVE Sensitive     * >=100,000 COLONIES/mL KLEBSIELLA PNEUMONIAE  Blood Culture (routine x 2)     Status: None (Preliminary result)   Collection Time: 01/04/15  3:05 PM  Result Value Ref Range Status   Specimen Description BLOOD LEFT ARM  Final   Special Requests BOTTLES DRAWN AEROBIC AND ANAEROBIC 10CC  Final   Culture NO GROWTH 2 DAYS  Final   Report Status PENDING  Incomplete  Blood Culture (routine x 2)     Status: None (Preliminary result)   Collection  Time: 01/04/15  3:15 PM  Result Value Ref Range Status   Specimen Description BLOOD LEFT HAND  Final   Special Requests BOTTLES DRAWN AEROBIC ONLY 10CC  Final   Culture NO GROWTH 2 DAYS  Final   Report Status PENDING  Incomplete  MRSA PCR Screening     Status: None   Collection Time: 01/04/15 11:19 PM  Result Value Ref Range Status   MRSA by PCR NEGATIVE NEGATIVE Final    Comment:        The GeneXpert MRSA Assay (FDA approved for NASAL specimens only), is one component of a comprehensive MRSA colonization surveillance program. It is not intended to diagnose MRSA infection nor to guide or monitor treatment for MRSA infections.   C difficile quick scan w PCR reflex     Status: None   Collection Time: 01/06/15  2:15 AM  Result Value Ref Range Status     C Diff antigen NEGATIVE NEGATIVE Final   C Diff toxin NEGATIVE NEGATIVE Final   C Diff interpretation Negative for toxigenic C. difficile  Final   Studies/Results: No results found. Medications: I have reviewed the patient's current medications. Scheduled Meds: . atorvastatin  40 mg Oral QHS  . cilostazol  100 mg Oral BID  . escitalopram  20 mg Oral Daily  . levofloxacin  500 mg Oral Daily  . levothyroxine  100 mcg Oral QAC breakfast  . pantoprazole  40 mg Oral Daily   Continuous Infusions:  PRN Meds:.clonazePAM, oxybutynin, sodium chloride, sodium chloride, zolpidem Assessment/Plan:  Urinary Tract Infection with Septic Shock Secondary to Ureterolithiasis: He is currently afebrile. Leukocytosis is resolving with WBC of 14.5 today (down from 19 yesterday). Lactate has reduced from 2.4 to 1.2 today. While he has a mild tachycardia to 108 today, there is no hypotension. Sensitivities and clinical response suggest that levofloxacin is appropriate for this infection. - Continue 14 day course of levaquin 500 mg. - Oxybutynin for overactive bladder s/p stenting - Appropriate for discharge  Thrombocytopenia:  Could be due to DIC in setting of sepsis. Has been in uptrending from 66 to 103 today. Hgb has been stable in the 12s. No signs of bleeding. - Will likely resolve, but should be monitored with close outpatient followup - Holding ASA  Acute Kidney Injury:  Nearly resolved. Cr. downtrending from 1.53 to 1.25 today, near baseline of 1.2.  Hypothyroidism s/p RAI:  Continue levothyroxine. TSH is normal  Peripheral Artery Disease: Occluded left popliteal artery. - Continue cilostazol  HTN: Held home amlodipine and Lopressor this evening in setting of hypotension. Will resume on discharge.  HLD: Continue atorvastatin. Hold ASA in setting of thrombocytopenia  Hypothyroidism s/p RAI: Continue home levothyroxine 100 mcg daily  Depression vs. Anxiety: Lexapro 20mg  daily, Clonazepam  prn anxiety  Insomnia: Ambien prn  GERD: On Prilosec at home -continue Protonix daily  Dispo: Anticipated discharge today to home.  The patient does have a current PCP Lillard Anes, MD) and does not need an [facility] hospital follow-up appointment after discharge.  The patient does not have transportation limitations that hinder transportation to clinic appointments.  .Services Needed at time of discharge: Y = Yes, Blank = No PT:   OT:   RN:   Equipment:   Other:     LOS: 3 days   Liberty Handy, MD 01/07/2015, 10:06 AM

## 2015-01-07 NOTE — Discharge Instructions (Addendum)
[name], you were in the hospital for a stone from your kidneys with led to an infection. You are being treated and are improving on antibiotics. It is very important that you continue to take these antibiotics (Levaquin or Levofloxacin), when you leave the hospital so that you can continue to improve. You will take this antibiotics once a day, and your last day of taking it will be September 4 to complete a 14 day course. If you notice worsening back pain, a temperature >100.4, or chills, please seek immediate medical attention.  While you were in the hospital your platelets, which helps your blood clot, went down. On the day you left the hospital, these were improving, but they need to be checked at you clinic appointment on August 29. Because of your low platelets, you should STOP taking aspirin at least until your clinic visit on August 29. We are also leaving it to your healthcare provider on whether or not to restart the sildenafil. If you or your family notice you are suddenly weak or lightheaded, you be become short of breath when walking, or you notice any new bleeding, you should seek immediate medical attention.  I have scheduled an appointment for you at [facility] with [place]. While he's not your regular doctor, it was more important to obtain an earlier appointment. This will be at the [facility] office at 1:30. You should be getting blood tests at this visit.  Kidney Stones Kidney stones (urolithiasis) are solid masses that form inside your kidneys. The intense pain is caused by the stone moving through the kidney, ureter, bladder, and urethra (urinary tract). When the stone moves, the ureter starts to spasm around the stone. The stone is usually passed in your pee (urine).  HOME CARE  Drink enough fluids to keep your pee clear or pale yellow. This helps to get the stone out.  Strain all pee through the provided strainer. Do not pee without peeing through the  strainer, not even once. If you pee the stone out, catch it in the strainer. The stone may be as small as a grain of salt. Take this to your doctor. This will help your doctor figure out what you can do to try to prevent more kidney stones.  Only take medicine as told by your doctor.  Follow up with your doctor as told.  Get follow-up X-rays as told by your doctor. GET HELP IF: You have pain that gets worse even if you have been taking pain medicine. GET HELP RIGHT AWAY IF:   Your pain does not get better with medicine.  You have a fever or shaking chills.  Your pain increases and gets worse over 18 hours.  You have new belly (abdominal) pain.  You feel faint or pass out.  You are unable to pee. MAKE SURE YOU:   Understand these instructions.  Will watch your condition.  Will get help right away if you are not doing well or get worse. Document Released: 10/19/2007 Document Revised: 01/02/2013 Document Reviewed: 10/03/2012 [facility] Patient Information 2015 [place], [place]. This information is not intended to replace advice given to you by your health care provider. Make sure you discuss any questions you have with your health care provider.

## 2015-01-09 LAB — CULTURE, BLOOD (ROUTINE X 2)
Culture: NO GROWTH
Culture: NO GROWTH

## 2015-01-21 ENCOUNTER — Encounter (HOSPITAL_BASED_OUTPATIENT_CLINIC_OR_DEPARTMENT_OTHER): Payer: Self-pay | Admitting: *Deleted

## 2015-01-21 ENCOUNTER — Other Ambulatory Visit: Payer: Self-pay | Admitting: Urology

## 2015-01-22 ENCOUNTER — Encounter (HOSPITAL_BASED_OUTPATIENT_CLINIC_OR_DEPARTMENT_OTHER): Payer: Self-pay | Admitting: *Deleted

## 2015-01-22 NOTE — Progress Notes (Signed)
   01/22/15 1444  OBSTRUCTIVE SLEEP APNEA  Have you ever been diagnosed with sleep apnea through a sleep study? No  Do you snore loudly (loud enough to be heard through closed doors)?  1  Has anyone observed you stop breathing during your sleep? 1  Do you have, or are you being treated for high blood pressure? 1  BMI more than 35 kg/m2? 1  Age > 50 (1-yes) 0  Neck circumference greater than:Male 16 inches or larger, Male 17inches or larger? 1  Male Gender (Yes=1) 1  Obstructive Sleep Apnea Score 6  Score 5 or greater  Results sent to PCP

## 2015-01-22 NOTE — Progress Notes (Signed)
SPOKE W/ WIFE.  NPO AFTER MN.  ARRIVE AT [id number].  CURRENT LAB RESULTS AND EKG IN CHART AND EPIC. PT HAD LAB WORK REPEATED AT HIS PCP OFFICE , WILL HAVE FAXED (PT PLT 78 IN EPIC).  WILL TAKE NORVASC, LOPRESSOR, LOPRESSOR, AND PRILOSEC AM DOS W/ SIPS OF WATER.

## 2015-01-25 NOTE — H&P (Signed)
Urology History and Physical Exam  CC: Recent kidney stone  HPI: 49 year old male with history of a right ureteral stone with subsequent UTI and sepsis--s/p urgent stenting 8/21--comes in for cysto, ureteroscopy and stone extraction. Recent OV and urinalysis confirmed clear urine.  PMH: Past Medical History  Diagnosis Date  . GERD (gastroesophageal reflux disease)   . Arthritis   . Chronic back pain     "from the neck to the lower back"   . Hypertension   . Hyperlipidemia   . History of Graves' disease     vs  Multinodular  . History of thyroid storm     Nov 2011  . Right ureteral stone   . Hypothyroidism following radioiodine therapy     RAI in Mar 2012---  followed by dr Loanne Drilling  . History of panic attacks   . History of kidney stones   . History of non-ST elevation myocardial infarction (NSTEMI)     Jan 2014--  no CAD;  per notes probable coronary vasospasm  . History of septic shock     01-07-2015--  ureterolithias/ pyelonephritis  . History of acute pyelonephritis     01-07-2015  . Thrombocytopenia   . History of hiatal hernia   . PAD (peripheral artery disease) cardiologist-  dr Fletcher Anon    a.  ABI (01/2014):  L 0.53; R 1.0 >> referred to [facility]  . 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 At risk for sleep apnea     STOP-BANG= 6       SENT TO PCP 01-22-2015    PSH: Past Surgical History  Procedure Laterality Date  . Anterior cervical decomp/discectomy fusion  09-08-2010    C3 -4  . Shoulder arthroscopy Right 03-08-2004    debridement labral tear/  DCR/  acromioplasty  . Posterior cervical fusion/foraminotomy N/A 12/10/2012    Procedure: POSTERIOR CERVICAL FUSION/FORAMINOTOMY LEVEL 1;  Surgeon: Ophelia Charter, MD;  Location: [facility];  Service: Neurosurgery;  Laterality: N/A;  Cervical three-four posterior cervical fusion with lateral mass screws  . Left  heart catheterization with coronary angiogram N/A 06/12/2012    Procedure: LEFT HEART CATHETERIZATION WITH CORONARY ANGIOGRAM;  Surgeon: Josue Hector, MD;  Location: [facility] CATH LAB;  Service: Cardiovascular;  Laterality: N/A;   No sig. CAD/  normal LVF, ef 55-65%  . Abdominal aortagram N/A 02/19/2014    Procedure: ABDOMINAL Maxcine Ham;  Surgeon: Wellington Hampshire, MD;  Location: [facility] CATH LAB;  Service: Cardiovascular;  Laterality: N/A;  . Popliteal artery angioplasty Left 05/28/2014    dr Fletcher Anon    Attempted and unsuccessful due to inability to cross the occlusionnotes   . Umbilical hernia repair  1980  . Posterior lumbar fusion  11-11-2009;   07-15-2013    L5 -- S1;   L4-- S1  . Lower extremity angiogram Left 05/28/2014    Procedure: LOWER EXTREMITY ANGIOGRAM;  Surgeon: Wellington Hampshire, MD;  Location: [facility] CATH LAB;  Service: Cardiovascular;  Laterality: Left;  Failed PTA CTO  . Cystoscopy w/ ureteral stent placement Right 01/04/2015    Procedure: CYSTOSCOPY WITH RETROGRADE PYELOGRAM/URETERAL STENT PLACEMENT;  Surgeon: Franchot Gallo, MD;  Location: [facility];  Service: Urology;  Laterality: Right;  . Laminectomy and microdiscectomy lumbar spine  03-26-2009    left L5 -- S1  . Transthoracic echocardiogram  01-05-2015    mild LVH/  ef 60-65%/  mild TR    Allergies: Allergies  Allergen Reactions  . Vicodin [Hydrocodone-Acetaminophen] Hives    Medications: No prescriptions prior to admission     Social History: Social History   Social History  . Marital Status: Married    Spouse Name: N/A  . Number of Children: N/A  . Years of Education: N/A   Occupational History  . Disabled    Social History Main Topics  . Smoking status: Never Smoker   . Smokeless tobacco: Never Used  . Alcohol Use: 0.0 oz/week     Comment: RARE  . Drug Use: No  . Sexual Activity: Not on file   Other Topics Concern  . Not on file   Social History Narrative   Regular exercise-no   Disabled to due back  problem    Family History: Family History  Problem Relation Age of Onset  . Thyroid disease Mother     hypothyroidism  . Heart attack Maternal Grandfather   . Heart Problems Father     pacermaker  . Edema Father   . Heart disease Maternal Grandmother   . Cancer Maternal Grandmother   . Diabetes Maternal Grandmother   . Hypertension Maternal Grandmother     Review of Systems: Positive: urinary frequency, stent sx's Negative:  A further 10 point review of systems was negative except what is listed in the HPI.                  Physical Exam: @VITALS2 @ General: No acute distress.  Awake. Head:  Normocephalic.  Atraumatic. ENT:  EOMI.  Mucous membranes moist Neck:  Supple.  No lymphadenopathy. CV:  S1 present. S2 present. Regular rate. Pulmonary: Equal effort bilaterally.  Clear to auscultation bilaterally. Abdomen: Soft.  Nontender to palpation. Skin:  Normal turgor.  No visible rash. Extremity: No gross deformity of bilateral upper extremities.  No gross deformity of                             lower extremities. Neurologic: Alert. Appropriate mood.    Studies:  No results for input(s): HGB, WBC, PLT in the last 72 hours.  No results for input(s): NA, K, CL, CO2, BUN, CREATININE, CALCIUM, GFRNONAA, GFRAA in the last 72 hours.  Invalid input(s): MAGNESIUM   No results for input(s): INR, APTT in the last 72 hours.  Invalid input(s): PT   Invalid input(s): ABG    Assessment:  Right distal ureteral stone w/ subsequent UTI/sepsis--s/p urgent stenting and abx management--now presenting for definitive stone mgmt  Plan: Cysto, stent extraction, ureteroscopy, stone extraction

## 2015-01-26 ENCOUNTER — Encounter (HOSPITAL_BASED_OUTPATIENT_CLINIC_OR_DEPARTMENT_OTHER): Payer: Self-pay | Admitting: *Deleted

## 2015-01-26 ENCOUNTER — Encounter (HOSPITAL_BASED_OUTPATIENT_CLINIC_OR_DEPARTMENT_OTHER)
Admission: RE | Disposition: A | Discharge status: Home / Self Care | Payer: Self-pay | Source: Ambulatory Visit | Attending: Urology

## 2015-01-26 ENCOUNTER — Ambulatory Visit (HOSPITAL_BASED_OUTPATIENT_CLINIC_OR_DEPARTMENT_OTHER): Payer: Medicaid Other | Admitting: Certified Registered"

## 2015-01-26 ENCOUNTER — Ambulatory Visit (HOSPITAL_BASED_OUTPATIENT_CLINIC_OR_DEPARTMENT_OTHER)
Admission: RE | Admit: 2015-01-26 | Discharge: 2015-01-26 | Disposition: A | Discharge status: Home / Self Care | Payer: Medicaid Other | Source: Ambulatory Visit | Attending: Urology | Admitting: Urology

## 2015-01-26 DIAGNOSIS — E039 Hypothyroidism, unspecified: Secondary | ICD-10-CM | POA: Diagnosis not present

## 2015-01-26 DIAGNOSIS — N201 Calculus of ureter: Secondary | ICD-10-CM | POA: Insufficient documentation

## 2015-01-26 DIAGNOSIS — G8929 Other chronic pain: Secondary | ICD-10-CM | POA: Insufficient documentation

## 2015-01-26 DIAGNOSIS — Z87442 Personal history of urinary calculi: Secondary | ICD-10-CM | POA: Insufficient documentation

## 2015-01-26 DIAGNOSIS — I10 Essential (primary) hypertension: Secondary | ICD-10-CM | POA: Diagnosis not present

## 2015-01-26 DIAGNOSIS — M549 Dorsalgia, unspecified: Secondary | ICD-10-CM | POA: Diagnosis not present

## 2015-01-26 DIAGNOSIS — I739 Peripheral vascular disease, unspecified: Secondary | ICD-10-CM | POA: Diagnosis not present

## 2015-01-26 DIAGNOSIS — K219 Gastro-esophageal reflux disease without esophagitis: Secondary | ICD-10-CM | POA: Insufficient documentation

## 2015-01-26 DIAGNOSIS — I252 Old myocardial infarction: Secondary | ICD-10-CM | POA: Insufficient documentation

## 2015-01-26 DIAGNOSIS — M1990 Unspecified osteoarthritis, unspecified site: Secondary | ICD-10-CM | POA: Diagnosis not present

## 2015-01-26 DIAGNOSIS — E785 Hyperlipidemia, unspecified: Secondary | ICD-10-CM | POA: Insufficient documentation

## 2015-01-26 HISTORY — DX: Personal history of other diseases of the digestive system: Z87.19

## 2015-01-26 HISTORY — DX: Personal history of other mental and behavioral disorders: Z86.59

## 2015-01-26 HISTORY — DX: Personal history of other infectious and parasitic diseases: Z86.19

## 2015-01-26 HISTORY — DX: Chronic total occlusion of artery of the extremities: I70.92

## 2015-01-26 HISTORY — DX: Thrombocytopenia, unspecified: D69.6

## 2015-01-26 HISTORY — DX: Personal history of urinary (tract) infections: Z87.440

## 2015-01-26 HISTORY — DX: Essential (primary) hypertension: I10

## 2015-01-26 HISTORY — DX: Personal history of other endocrine, nutritional and metabolic disease: Z86.39

## 2015-01-26 HISTORY — PX: CYSTOSCOPY W/ URETERAL STENT REMOVAL: SHX1430

## 2015-01-26 HISTORY — DX: Other specified personal risk factors, not elsewhere classified: Z91.89

## 2015-01-26 HISTORY — DX: Personal history of urinary calculi: Z87.442

## 2015-01-26 HISTORY — DX: Old myocardial infarction: I25.2

## 2015-01-26 HISTORY — DX: Calculus of ureter: N20.1

## 2015-01-26 HISTORY — PX: CYSTOSCOPY/URETEROSCOPY/HOLMIUM LASER/STENT PLACEMENT: SHX6546

## 2015-01-26 HISTORY — DX: Hyperlipidemia, unspecified: E78.5

## 2015-01-26 SURGERY — CYSTOSCOPY/URETEROSCOPY/HOLMIUM LASER/STENT PLACEMENT
Anesthesia: General | Laterality: Right

## 2015-01-26 MED ORDER — CEFAZOLIN SODIUM-DEXTROSE 2-3 GM-% IV SOLR
2.0000 g | INTRAVENOUS | Status: AC
  Administered 2015-01-26: 2 g via INTRAVENOUS
  Filled 2015-01-26: qty 50

## 2015-01-26 MED ORDER — PROPOFOL 10 MG/ML IV BOLUS
INTRAVENOUS | Status: DC | PRN
  Administered 2015-01-26: 200 mg via INTRAVENOUS

## 2015-01-26 MED ORDER — FENTANYL CITRATE (PF) 100 MCG/2ML IJ SOLN
INTRAMUSCULAR | Status: AC
  Filled 2015-01-26: qty 6

## 2015-01-26 MED ORDER — HYDROMORPHONE HCL 1 MG/ML IJ SOLN
0.2500 mg | INTRAMUSCULAR | Status: DC | PRN
  Filled 2015-01-26: qty 1

## 2015-01-26 MED ORDER — DEXAMETHASONE SODIUM PHOSPHATE 4 MG/ML IJ SOLN
INTRAMUSCULAR | Status: DC | PRN
  Administered 2015-01-26: 8 mg via INTRAVENOUS

## 2015-01-26 MED ORDER — LIDOCAINE HCL (CARDIAC) 20 MG/ML IV SOLN
INTRAVENOUS | Status: DC | PRN
  Administered 2015-01-26: 80 mg via INTRAVENOUS

## 2015-01-26 MED ORDER — MEPERIDINE HCL 25 MG/ML IJ SOLN
6.2500 mg | INTRAMUSCULAR | Status: DC | PRN
  Filled 2015-01-26: qty 1

## 2015-01-26 MED ORDER — LACTATED RINGERS IV SOLN
INTRAVENOUS | Status: DC
  Administered 2015-01-26: 09:00:00 via INTRAVENOUS
  Filled 2015-01-26: qty 1000

## 2015-01-26 MED ORDER — ONDANSETRON HCL 4 MG/2ML IJ SOLN
4.0000 mg | Freq: Once | INTRAMUSCULAR | Status: DC | PRN
  Filled 2015-01-26: qty 2

## 2015-01-26 MED ORDER — KETOROLAC TROMETHAMINE 30 MG/ML IJ SOLN
INTRAMUSCULAR | Status: DC | PRN
  Administered 2015-01-26: 30 mg via INTRAVENOUS

## 2015-01-26 MED ORDER — FENTANYL CITRATE (PF) 100 MCG/2ML IJ SOLN
INTRAMUSCULAR | Status: DC | PRN
  Administered 2015-01-26: 50 ug via INTRAVENOUS
  Administered 2015-01-26: 25 ug via INTRAVENOUS

## 2015-01-26 MED ORDER — CEFAZOLIN SODIUM 1-5 GM-% IV SOLN
1.0000 g | INTRAVENOUS | Status: DC
  Filled 2015-01-26: qty 50

## 2015-01-26 MED ORDER — SODIUM CHLORIDE 0.9 % IR SOLN
Status: DC | PRN
  Administered 2015-01-26: 4000 mL via INTRAVESICAL

## 2015-01-26 MED ORDER — MIDAZOLAM HCL 5 MG/5ML IJ SOLN
INTRAMUSCULAR | Status: DC | PRN
  Administered 2015-01-26: 2 mg via INTRAVENOUS

## 2015-01-26 MED ORDER — MIDAZOLAM HCL 2 MG/2ML IJ SOLN
INTRAMUSCULAR | Status: AC
  Filled 2015-01-26: qty 2

## 2015-01-26 MED ORDER — SULFAMETHOXAZOLE-TRIMETHOPRIM 800-160 MG PO TABS: 1.0000 | ORAL_TABLET | Freq: Two times a day (BID) | ORAL | Status: DC

## 2015-01-26 MED ORDER — CEFAZOLIN SODIUM-DEXTROSE 2-3 GM-% IV SOLR
INTRAVENOUS | Status: AC
  Filled 2015-01-26: qty 50

## 2015-01-26 MED ORDER — ONDANSETRON HCL 4 MG/2ML IJ SOLN
INTRAMUSCULAR | Status: DC | PRN
  Administered 2015-01-26: 4 mg via INTRAVENOUS

## 2015-01-26 SURGICAL SUPPLY — 18 items
ADAPTER CATH URET PLST 4-6FR (CATHETERS) IMPLANT
ADPR CATH URET STRL DISP 4-6FR (CATHETERS)
BAG DRAIN URO-CYSTO SKYTR STRL (DRAIN) ×3 IMPLANT
BAG DRN UROCATH (DRAIN) ×1
CANISTER SUCT LVC 12 LTR MEDI- (MISCELLANEOUS) IMPLANT
CATH INTERMIT  6FR 70CM (CATHETERS) IMPLANT
CLOTH BEACON ORANGE TIMEOUT ST (SAFETY) ×3 IMPLANT
GLOVE BIO SURGEON STRL SZ8 (GLOVE) ×3 IMPLANT
GOWN STRL REUS W/ TWL LRG LVL3 (GOWN DISPOSABLE) ×1 IMPLANT
GOWN STRL REUS W/ TWL XL LVL3 (GOWN DISPOSABLE) ×1 IMPLANT
GOWN STRL REUS W/TWL LRG LVL3 (GOWN DISPOSABLE) ×2 IMPLANT
GOWN STRL REUS W/TWL XL LVL3 (GOWN DISPOSABLE) ×2 IMPLANT
GUIDEWIRE 0.038 PTFE COATED (WIRE) IMPLANT
GUIDEWIRE ANG ZIPWIRE 038X150 (WIRE) IMPLANT
GUIDEWIRE STR DUAL SENSOR (WIRE) IMPLANT
MANIFOLD NEPTUNE II (INSTRUMENTS) IMPLANT
NS IRRIG 500ML POUR BTL (IV SOLUTION) IMPLANT
PACK CYSTO (CUSTOM PROCEDURE TRAY) ×3 IMPLANT

## 2015-01-26 NOTE — Transfer of Care (Signed)
Immediate Anesthesia Transfer of Care Note  Patient: Jeff [name]  Procedure(s) Performed: Procedure(s) (LRB): CYSTOSCOPY/URETEROSCOPY RIGHT (Right) CYSTOSCOPY WITH STENT REMOVAL (Right)  Patient Location: PACU  Anesthesia Type: General  Level of Consciousness: awake, oriented, sedated and patient cooperative  Airway & Oxygen Therapy: Patient Spontanous Breathing and Patient connected to face mask oxygen  Post-op Assessment: Report given to PACU RN and Post -op Vital signs reviewed and stable  Post vital signs: Reviewed and stable  Complications: No apparent anesthesia complications

## 2015-01-26 NOTE — Op Note (Signed)
PATIENT:  Jeff [name]  PRE-OPERATIVE DIAGNOSIS: History of small right distal ureteral stone with associated UTI/urosepsis, status post stenting  POST-OPERATIVE DIAGNOSIS: No evidence of right ureteral stone-most likely past  PROCEDURE: Cystoscopy, right double-J stent extraction, right ureteroscopy  SURGEON:  Lillette Boxer. Naira Standiford, M.D.  ANESTHESIA:  General  EBL:  Minimal  DRAINS: None  LOCAL MEDICATIONS USED:  None  SPECIMEN:  None  INDICATION: [name] is a 49 year old male who presents this time for stent extraction with ureteroscopy for management of the right distal ureteral stone. He presented to the healthcare system here in late August with a long-standing history of intermittent right flank pain, a 2-3 mm right distal ureteral stone with a UTI and associated sepsis. The patient was urgently stented and spent a fair amount of time in the intensive care unit due to significant hypotension and tachycardia. His urinary tract infection has been adequately treated. He was seen last week in follow-up. Due to his prior history, it was recommended that we remove his stent and perform ureteroscopy under anesthesia to assure that there is no remaining calculus disease. He presents at this time or those procedures. Risks and complications have been discussed with him. He understands these and desires to proceed.  Description of procedure: The patient was properly identified and marked (if applicable) in the holding area. They were then  taken to the operating room and placed on the table in a supine position. General anesthesia was then administered. Once fully anesthetized the patient was moved to the dorsolithotomy position and the genitalia and perineum were sterilely prepped and draped in standard fashion. An official timeout was then performed.  A 23 French cystoscope was advanced under direct vision through his urethra. Urethra was free of lesions. Prostate was  nonobstructive. Bladder was entered and inspected circumferentially. There were no tumors trabeculations. Ureteral orifice on the right had an associated stent protruding through it. Left ureteral orifice was normal. The graspers were used to grasp the stent and bring it through the urethral meatus. Through this, a 0.038 inch sensor-tip guidewire was advanced through the stent, up into the right renal pelvis. The stent was then removed. A 6 French short ureteroscope was advanced under direct vision through his urethra, into the bladder and up into the right ureteral orifice. The ureteroscope was advanced more proximally to the full extent of the scope. No ureteral lesions were seen. No stones were seen. As there may have been proximal migration of the stone, I then removed the semirigid scope and then passed a 6 [place] digital flexible ureteroscope up over the guidewire. This was then advanced into the right renal pelvis. The calyces were thoroughly inspected, as was the renal pelvis. No stones were seen using thorough examination of all calyces. I then removed the scope, and visually examined the entire ureter while removing the scope. Again, no stone was seen. As the patient had been previously stented, I did not leave a stent in at this time as the ureter was quite patulous. The flexible digital ureteroscope was removed. I then drained the bladder following repeat inspection, and remove the cystoscope.  The patient tolerated procedure well. He was awakened and taken to the PACU in stable condition. PLAN OF CARE: Discharge to home after PACU  PATIENT DISPOSITION:  PACU - hemodynamically stable.

## 2015-01-26 NOTE — Anesthesia Postprocedure Evaluation (Signed)
Anesthesia Post Note  Patient: Jeff [name]  Procedure(s) Performed: Procedure(s) (LRB): CYSTOSCOPY/URETEROSCOPY RIGHT (Right) CYSTOSCOPY WITH STENT REMOVAL (Right)  Anesthesia type: general  Patient location: PACU  Post pain: Pain level controlled  Post assessment: Patient's Cardiovascular Status Stable  Last Vitals:  Filed Vitals:   01/26/15 1101  BP: 116/78  Pulse: 77  Temp: 36.5 C  Resp: 16    Post vital signs: Reviewed and stable  Level of consciousness: sedated  Complications: No apparent anesthesia complications

## 2015-01-26 NOTE — Discharge Instructions (Signed)
1. You may see some blood in the urine and may have some burning with urination for 48-72 hours. You also may notice that you have to urinate more frequently or urgently after your procedure which is normal.  2. You should call should you develop an inability urinate, fever > 101, persistent nausea and vomiting that prevents you from eating or drinking to stay hydrated.  If you have a catheter, you will be taught how to take care of the catheter by the nursing staff prior to discharge from the hospital.  You may periodically feel a strong urge to void with the catheter in place.  This is a bladder spasm and most often can occur when having a bowel movement or moving around. It is typically self-limited and usually will stop after a few minutes.  You may use some Vaseline or Neosporin around the tip of the catheter to reduce friction at the tip of the penis. You may also see some blood in the urine.  A very small amount of blood can make the urine look quite red.  As long as the catheter is draining well, there usually is not a problem.  However, if the catheter is not draining well and is bloody, you should call the office [phone] to notify us. Post Anesthesia Home Care Instructions  Activity: Get plenty of rest for the remainder of the day. A responsible adult should stay with you for 24 hours following the procedure.  For the next 24 hours, DO NOT: -Drive a car -Paediatric nurse -Drink alcoholic beverages -Take any medication unless instructed by your physician -Make any legal decisions or sign important papers.  Meals: Start with liquid foods such as gelatin or soup. Progress to regular foods as tolerated. Avoid greasy, spicy, heavy foods. If nausea and/or vomiting occur, drink only clear liquids until the nausea and/or vomiting subsides. Call your physician if vomiting continues.  Special Instructions/Symptoms: Your throat may feel dry or sore from the anesthesia or the breathing tube  placed in your throat during surgery. If this causes discomfort, gargle with warm salt water. The discomfort should disappear within 24 hours.  If you had a scopolamine patch placed behind your ear for the management of post- operative nausea and/or vomiting:  1. The medication in the patch is effective for 72 hours, after which it should be removed.  Wrap patch in a tissue and discard in the trash. Wash hands thoroughly with soap and water. 2. You may remove the patch earlier than 72 hours if you experience unpleasant side effects which may include dry mouth, dizziness or visual disturbances. 3. Avoid touching the patch. Wash your hands with soap and water after contact with the patch.    Post Anesthesia Home Care Instructions  Activity: Get plenty of rest for the remainder of the day. A responsible adult should stay with you for 24 hours following the procedure.  For the next 24 hours, DO NOT: -Drive a car -Paediatric nurse -Drink alcoholic beverages -Take any medication unless instructed by your physician -Make any legal decisions or sign important papers.  Meals: Start with liquid foods such as gelatin or soup. Progress to regular foods as tolerated. Avoid greasy, spicy, heavy foods. If nausea and/or vomiting occur, drink only clear liquids until the nausea and/or vomiting subsides. Call your physician if vomiting continues.  Special Instructions/Symptoms: Your throat may feel dry or sore from the anesthesia or the breathing tube placed in your throat during surgery. If this causes discomfort, gargle  with warm salt water. The discomfort should disappear within 24 hours.  If you had a scopolamine patch placed behind your ear for the management of post- operative nausea and/or vomiting:  1. The medication in the patch is effective for 72 hours, after which it should be removed.  Wrap patch in a tissue and discard in the trash. Wash hands thoroughly with soap and water. 2. You may  remove the patch earlier than 72 hours if you experience unpleasant side effects which may include dry mouth, dizziness or visual disturbances. 3. Avoid touching the patch. Wash your hands with soap and water after contact with the patch.

## 2015-01-26 NOTE — Anesthesia Preprocedure Evaluation (Signed)
Anesthesia Evaluation  Patient identified by MRN, date of birth, ID band Patient awake    Reviewed: Allergy & Precautions, NPO status , Patient's Chart, lab work & pertinent test results  Airway Mallampati: II  TM Distance: >3 FB Neck ROM: Full    Dental   Pulmonary    Pulmonary exam normal        Cardiovascular hypertension, Pt. on medications + Past MI  Normal cardiovascular exam     Neuro/Psych    GI/Hepatic GERD  Medicated and Controlled,  Endo/Other    Renal/GU      Musculoskeletal   Abdominal   Peds  Hematology   Anesthesia Other Findings   Reproductive/Obstetrics                             Anesthesia Physical Anesthesia Plan  ASA: III  Anesthesia Plan: General   Post-op Pain Management:    Induction: Intravenous  Airway Management Planned: LMA  Additional Equipment:   Intra-op Plan:   Post-operative Plan: Extubation in OR  Informed Consent: I have reviewed the patients History and Physical, chart, labs and discussed the procedure including the risks, benefits and alternatives for the proposed anesthesia with the patient or authorized representative who has indicated his/her understanding and acceptance.     Plan Discussed with: CRNA and Surgeon  Anesthesia Plan Comments:         Anesthesia Quick Evaluation

## 2015-01-26 NOTE — Anesthesia Procedure Notes (Signed)
Procedure Name: LMA Insertion Date/Time: 01/26/2015 9:18 AM Performed by: Denna Haggard D Pre-anesthesia Checklist: Patient identified, Emergency Drugs available, Suction available and Patient being monitored Patient Re-evaluated:Patient Re-evaluated prior to inductionOxygen Delivery Method: Circle System Utilized Preoxygenation: Pre-oxygenation with 100% oxygen Intubation Type: IV induction Ventilation: Mask ventilation without difficulty LMA: LMA inserted LMA Size: 4.0 Number of attempts: 1 Airway Equipment and Method: Bite block Placement Confirmation: positive ETCO2 Tube secured with: Tape Dental Injury: Teeth and Oropharynx as per pre-operative assessment

## 2015-01-27 ENCOUNTER — Encounter (HOSPITAL_BASED_OUTPATIENT_CLINIC_OR_DEPARTMENT_OTHER): Payer: Self-pay | Admitting: Urology

## 2015-01-28 ENCOUNTER — Other Ambulatory Visit: Payer: Self-pay

## 2015-01-28 ENCOUNTER — Encounter: Payer: Self-pay | Admitting: Endocrinology

## 2015-01-28 ENCOUNTER — Telehealth: Payer: Self-pay | Admitting: Endocrinology

## 2015-01-28 ENCOUNTER — Ambulatory Visit (INDEPENDENT_AMBULATORY_CARE_PROVIDER_SITE_OTHER): Payer: Medicaid Other | Admitting: Endocrinology

## 2015-01-28 VITALS — BP 128/86 | HR 75 | Temp 98.0°F | Ht 70.0 in | Wt 238.0 lb

## 2015-01-28 DIAGNOSIS — E890 Postprocedural hypothyroidism: Secondary | ICD-10-CM

## 2015-01-28 MED ORDER — LEVOTHYROXINE SODIUM 112 MCG PO TABS: 112.0000 ug | ORAL_TABLET | Freq: Every day | ORAL | Status: DC

## 2015-01-28 NOTE — Telephone Encounter (Signed)
please call patient: i got your lab results. We need to slightly increase the thyroid pill. i have sent a prescription to your pharmacy

## 2015-01-28 NOTE — Progress Notes (Signed)
Subjective:    Patient ID: Jeff [name], male    DOB: Sep 19, 1965, 49 y.o.   MRN: [id number]  HPI  Pt returns for f/u of post-I-131 hypothyroidism (in 2012, he had i-131 rx for hyperthyroidism, due to grave's dz vs multinodular goiter vs both).  pt states he feels well in general, and he takes synthroid as rx'ed, but he thinks it is a 100 mcg tab he takes.  Past Medical History  Diagnosis Date  . GERD (gastroesophageal reflux disease)   . Arthritis   . Chronic back pain     "from the neck to the lower back"   . Hypertension   . Hyperlipidemia   . History of Graves' disease     vs  Multinodular  . History of thyroid storm     Nov 2011  . Right ureteral stone   . Hypothyroidism following radioiodine therapy     RAI in Mar 2012---  followed by dr Loanne Drilling  . History of panic attacks   . History of kidney stones   . History of non-ST elevation myocardial infarction (NSTEMI)     Jan 2014--  no CAD;  per notes probable coronary vasospasm  . History of septic shock     01-07-2015--  ureterolithias/ pyelonephritis  . History of acute pyelonephritis     01-07-2015  . Thrombocytopenia   . History of hiatal hernia   . PAD (peripheral artery disease) cardiologist-  dr Fletcher Anon    a.  ABI (01/2014):  L 0.53; R 1.0 >> referred to PV  . 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 At risk for sleep apnea     STOP-BANG= 6       SENT TO PCP 01-22-2015    Past Surgical History  Procedure Laterality Date  . Anterior cervical decomp/discectomy fusion  09-08-2010    C3 -4  . Shoulder arthroscopy Right 03-08-2004    debridement labral tear/  DCR/  acromioplasty  . Posterior cervical fusion/foraminotomy N/A 12/10/2012    Procedure: POSTERIOR CERVICAL FUSION/FORAMINOTOMY LEVEL 1;  Surgeon: Ophelia Charter, MD;  Location: [place] [facility]UR[facility] ORS;  Service: Neurosurgery;  Laterality: N/A;  Cervical  three-four posterior cervical fusion with lateral mass screws  . Left heart catheterization with coronary angiogram N/A 06/12/2012    Procedure: LEFT HEART CATHETERIZATION WITH CORONARY ANGIOGRAM;  Surgeon: Josue Hector, MD;  Location: [facility] CATH LAB;  Service: Cardiovascular;  Laterality: N/A;   No sig. CAD/  normal LVF, ef 55-65%  . Abdominal aortagram N/A 02/19/2014    Procedure: ABDOMINAL Maxcine Ham;  Surgeon: Wellington Hampshire, MD;  Location: [facility] CATH LAB;  Service: Cardiovascular;  Laterality: N/A;  . Popliteal artery angioplasty Left 05/28/2014    dr Fletcher Anon    Attempted and unsuccessful due to inability to cross the occlusionnotes   . Umbilical hernia repair  1980  . Posterior lumbar fusion  11-11-2009;   07-15-2013    L5 -- S1;   L4-- S1  . Lower extremity angiogram Left 05/28/2014    Procedure: LOWER EXTREMITY ANGIOGRAM;  Surgeon: Wellington Hampshire, MD;  Location: [place] CATH LAB;  Service: Cardiovascular;  Laterality: Left;  Failed PTA CTO  . Cystoscopy w/ ureteral stent placement Right 01/04/2015    Procedure: CYSTOSCOPY WITH RETROGRADE PYELOGRAM/URETERAL STENT PLACEMENT;  Surgeon: Franchot Gallo, MD;  Location: [place];  Service: Urology;  Laterality: Right;  . Laminectomy  and microdiscectomy lumbar spine  03-26-2009    left L5 -- S1  . Transthoracic echocardiogram  01-05-2015    mild LVH/  ef 60-65%/  mild TR  . Cystoscopy/ureteroscopy/holmium laser/stent placement Right 01/26/2015    Procedure: CYSTOSCOPY/URETEROSCOPY RIGHT;  Surgeon: Franchot Gallo, MD;  Location: [facility];  Service: Urology;  Laterality: Right;  . Cystoscopy w/ ureteral stent removal Right 01/26/2015    Procedure: CYSTOSCOPY WITH STENT REMOVAL;  Surgeon: Franchot Gallo, MD;  Location: [facility];  Service: Urology;  Laterality: Right;    Social History   Social History  . Marital Status: Married    Spouse Name: N/A  . Number of Children: N/A  . Years of Education: N/A    Occupational History  . Disabled    Social History Main Topics  . Smoking status: Never Smoker   . Smokeless tobacco: Never Used  . Alcohol Use: 0.0 oz/week     Comment: RARE  . Drug Use: No  . Sexual Activity: Not on file   Other Topics Concern  . Not on file   Social History Narrative   Regular exercise-no   Disabled to due back problem    Current Outpatient Prescriptions on File Prior to Visit  Medication Sig Dispense Refill  . amLODipine (NORVASC) 5 MG tablet TAKE 1 TABLET (5 MG TOTAL) BY MOUTH DAILY. (Patient taking differently: TAKE 1 TABLET (5 MG TOTAL) BY MOUTH DAILY.---  TAKES IN AM) 30 tablet 11  . aspirin EC 81 MG tablet Take 81 mg by mouth daily.    Marland Kitchen atorvastatin (LIPITOR) 40 MG tablet Take 40 mg by mouth every evening.     . cilostazol (PLETAL) 100 MG tablet TAKE 1 TABLET BY MOUTH TWICE DAILY. (Patient taking differently: TAKE 1 TABLET BY MOUTH TWICE DAILY.---) 180 tablet 3  . clonazePAM (KLONOPIN) 2 MG tablet Take 2 mg by mouth every evening. Pt takes at 2pm    . escitalopram (LEXAPRO) 20 MG tablet Take 20 mg by mouth every morning.   5  . Magnesium 250 MG TABS Take 250 mg by mouth daily.    . metoprolol (LOPRESSOR) 50 MG tablet Take 0.5 tablets (25 mg total) by mouth 2 (two) times daily. 180 tablet 3  . nitroGLYCERIN (NITROSTAT) 0.4 MG SL tablet Place 1 tablet (0.4 mg total) under the tongue every 5 (five) minutes as needed for chest pain. 25 tablet 3  . omega-3 acid ethyl esters (LOVAZA) 1 G capsule Take 1 g by mouth 2 (two) times daily.     Marland Kitchen omeprazole (PRILOSEC) 40 MG capsule Take 40 mg by mouth every morning.     Marland Kitchen oxybutynin (DITROPAN) 5 MG tablet Take 1 tablet (5 mg total) by mouth every 8 (eight) hours as needed for bladder spasms. 90 tablet 2  . oxyCODONE-acetaminophen (PERCOCET) 10-325 MG per tablet Take 1 tablet by mouth every 8 (eight) hours as needed for pain (for back pain).   0  . Potassium Gluconate 595 MG CAPS Take 1 capsule by mouth daily.    Marland Kitchen  sulfamethoxazole-trimethoprim (BACTRIM DS,SEPTRA DS) 800-160 MG per tablet Take 1 tablet by mouth 2 (two) times daily. 6 tablet 0  . zolpidem (AMBIEN) 10 MG tablet Take 10 mg by mouth at bedtime as needed. For sleep  2   No current facility-administered medications on file prior to visit.    Allergies  Allergen Reactions  . Vicodin [Hydrocodone-Acetaminophen] Hives    Family History  Problem Relation Age of  Onset  . Thyroid disease Mother     hypothyroidism  . Heart attack Maternal Grandfather   . Heart Problems Father     pacermaker  . Edema Father   . Heart disease Maternal Grandmother   . Cancer Maternal Grandmother   . Diabetes Maternal Grandmother   . Hypertension Maternal Grandmother     BP 128/86 mmHg  Pulse 75  Temp(Src) 98 F (36.7 C) (Oral)  Ht 5\' 10"  (1.778 m)  Wt 238 lb (107.956 kg)  BMI 34.15 kg/m2  SpO2 98%  Review of Systems He has gained weight.      Objective:   Physical Exam VITAL SIGNS:  See vs page GENERAL: no distress NECK: There is no palpable thyroid enlargement.  No thyroid nodule is palpable.  No palpable lymphadenopathy at the anterior neck.     Lab Results  Component Value Date   TSH 1.885 01/04/2015      Assessment & Plan:  Hypothyroidism: well-replaced.  Patient is advised the following: Patient Instructions  Please continue the same thyroid medication.   Please return in 1 year.   Our records show that the thyroid pill is a 112 strength.  Please recheck at home, and call us if it is different.   There is a slight chance that the overactive thyroid could come back.

## 2015-01-28 NOTE — Telephone Encounter (Signed)
Pt wife has a question about husband medication dosage please call her back at [phone]

## 2015-01-28 NOTE — Patient Instructions (Addendum)
Please continue the same thyroid medication.   Please return in 1 year.   Our records show that the thyroid pill is a 112 strength.  Please recheck at home, and call us if it is different.   There is a slight chance that the overactive thyroid could come back.

## 2015-01-28 NOTE — Telephone Encounter (Signed)
Attempted to reach the pt. Pt unavailable. Will try at a later time.

## 2015-01-28 NOTE — Telephone Encounter (Signed)
Patient wife stated that Dr Loanne Drilling told patient need to start taking  levothyroxine 112 mg but he called in levothyroxine 100 mg

## 2015-01-28 NOTE — Telephone Encounter (Signed)
Pt advised of note below during his office visit on 01/28/2015.

## 2015-01-29 MED ORDER — LEVOTHYROXINE SODIUM 112 MCG PO TABS: 112.0000 ug | ORAL_TABLET | Freq: Every day | ORAL | Status: DC

## 2015-01-29 NOTE — Telephone Encounter (Signed)
Patient need refill of levothyroxine (SYNTHROID, LEVOTHROID) 112 MCG tablet send to  [facility] [address] - [place], [place] - [phone] (Phone) [phone] (Fax)

## 2015-01-29 NOTE — Telephone Encounter (Signed)
Rx submitted to [facility].

## 2015-02-05 ENCOUNTER — Other Ambulatory Visit: Payer: Self-pay | Admitting: Physician Assistant

## 2015-02-27 ENCOUNTER — Ambulatory Visit: Payer: Medicaid Other | Admitting: Endocrinology

## 2015-03-05 ENCOUNTER — Ambulatory Visit (INDEPENDENT_AMBULATORY_CARE_PROVIDER_SITE_OTHER): Payer: Medicaid Other | Admitting: Endocrinology

## 2015-03-05 ENCOUNTER — Encounter: Payer: Self-pay | Admitting: Endocrinology

## 2015-03-05 VITALS — BP 116/74 | HR 84 | Temp 98.3°F | Resp 16 | Ht 70.0 in | Wt 236.6 lb

## 2015-03-05 DIAGNOSIS — Z23 Encounter for immunization: Secondary | ICD-10-CM

## 2015-03-05 DIAGNOSIS — E890 Postprocedural hypothyroidism: Secondary | ICD-10-CM

## 2015-03-05 LAB — TSH: TSH: 0.58 u[IU]/mL (ref 0.35–4.50)

## 2015-03-05 NOTE — Progress Notes (Signed)
Subjective:    Patient ID: Jeff [name], male    DOB: Dec 21, 1965, 49 y.o.   MRN: [id number]  HPI Pt returns for f/u of post-I-131 hypothyroidism (in 2012, he had i-131 rx for hyperthyroidism, due to grave's dz vs multinodular goiter vs both).  pt states he feels well in general, and he takes synthroid as rx'ed. Past Medical History  Diagnosis Date  . GERD (gastroesophageal reflux disease)   . Arthritis   . Chronic back pain     "from the neck to the lower back"   . Hypertension   . Hyperlipidemia   . History of Graves' disease     vs  Multinodular  . History of thyroid storm     Nov 2011  . Right ureteral stone   . Hypothyroidism following radioiodine therapy     RAI in Mar 2012---  followed by dr Loanne Drilling  . History of panic attacks   . History of kidney stones   . History of non-ST elevation myocardial infarction (NSTEMI)     Jan 2014--  no CAD;  per notes probable coronary vasospasm  . History of septic shock     01-07-2015--  ureterolithias/ pyelonephritis  . History of acute pyelonephritis     01-07-2015  . Thrombocytopenia ([place])   . History of hiatal hernia   . PAD (peripheral artery disease) [facility]) cardiologist-  dr Fletcher Anon    a.  ABI (01/2014):  L 0.53; R 1.0 >> referred to [facility]  . 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 At risk for sleep apnea     STOP-BANG= 6       SENT TO PCP 01-22-2015    Past Surgical History  Procedure Laterality Date  . Anterior cervical decomp/discectomy fusion  09-08-2010    C3 -4  . Shoulder arthroscopy Right 03-08-2004    debridement labral tear/  DCR/  acromioplasty  . Posterior cervical fusion/foraminotomy N/A 12/10/2012    Procedure: POSTERIOR CERVICAL FUSION/FORAMINOTOMY LEVEL 1;  Surgeon: Ophelia Charter, MD;  Location: [place] NEURO ORS;  Service: Neurosurgery;  Laterality: N/A;  Cervical three-four posterior cervical fusion  with lateral mass screws  . Left heart catheterization with coronary angiogram N/A 06/12/2012    Procedure: LEFT HEART CATHETERIZATION WITH CORONARY ANGIOGRAM;  Surgeon: Josue Hector, MD;  Location: [facility] CATH LAB;  Service: Cardiovascular;  Laterality: N/A;   No sig. CAD/  normal LVF, ef 55-65%  . Abdominal aortagram N/A 02/19/2014    Procedure: ABDOMINAL Maxcine Ham;  Surgeon: Wellington Hampshire, MD;  Location: [facility] CATH LAB;  Service: Cardiovascular;  Laterality: N/A;  . Popliteal artery angioplasty Left 05/28/2014    dr Fletcher Anon    Attempted and unsuccessful due to inability to cross the occlusionnotes   . Umbilical hernia repair  1980  . Posterior lumbar fusion  11-11-2009;   07-15-2013    L5 -- S1;   L4-- S1  . Lower extremity angiogram Left 05/28/2014    Procedure: LOWER EXTREMITY ANGIOGRAM;  Surgeon: Wellington Hampshire, MD;  Location: [place] CATH LAB;  Service: Cardiovascular;  Laterality: Left;  Failed PTA CTO  . Cystoscopy w/ ureteral stent placement Right 01/04/2015    Procedure: CYSTOSCOPY WITH RETROGRADE PYELOGRAM/URETERAL STENT PLACEMENT;  Surgeon: Franchot Gallo, MD;  Location: [place];  Service: Urology;  Laterality: Right;  . Laminectomy and microdiscectomy lumbar spine  03-26-2009    left  L5 -- S1  . Transthoracic echocardiogram  01-05-2015    mild LVH/  ef 60-65%/  mild TR  . Cystoscopy/ureteroscopy/holmium laser/stent placement Right 01/26/2015    Procedure: CYSTOSCOPY/URETEROSCOPY RIGHT;  Surgeon: Franchot Gallo, MD;  Location: [facility];  Service: Urology;  Laterality: Right;  . Cystoscopy w/ ureteral stent removal Right 01/26/2015    Procedure: CYSTOSCOPY WITH STENT REMOVAL;  Surgeon: Franchot Gallo, MD;  Location: [facility];  Service: Urology;  Laterality: Right;    Social History   Social History  . Marital Status: Married    Spouse Name: N/A  . Number of Children: N/A  . Years of Education: N/A   Occupational History  . Disabled    Social  History Main Topics  . Smoking status: Never Smoker   . Smokeless tobacco: Never Used  . Alcohol Use: 0.0 oz/week     Comment: RARE  . Drug Use: No  . Sexual Activity: Not on file   Other Topics Concern  . Not on file   Social History Narrative   Regular exercise-no   Disabled to due back problem    Current Outpatient Prescriptions on File Prior to Visit  Medication Sig Dispense Refill  . amLODipine (NORVASC) 5 MG tablet TAKE 1 TABLET (5 MG TOTAL) BY MOUTH DAILY. (Patient taking differently: TAKE 1 TABLET (5 MG TOTAL) BY MOUTH DAILY.---  TAKES IN AM) 30 tablet 11  . aspirin EC 81 MG tablet Take 81 mg by mouth daily.    Marland Kitchen atorvastatin (LIPITOR) 40 MG tablet Take 40 mg by mouth every evening.     . cilostazol (PLETAL) 100 MG tablet TAKE 1 TABLET BY MOUTH TWICE DAILY. (Patient taking differently: TAKE 1 TABLET BY MOUTH TWICE DAILY.---) 180 tablet 3  . clonazePAM (KLONOPIN) 2 MG tablet Take 2 mg by mouth every evening. Pt takes at 2pm    . escitalopram (LEXAPRO) 20 MG tablet Take 20 mg by mouth every morning.   5  . levothyroxine (SYNTHROID, LEVOTHROID) 112 MCG tablet Take 1 tablet (112 mcg total) by mouth daily before breakfast. 30 tablet 4  . Magnesium 250 MG TABS Take 250 mg by mouth daily.    . metoprolol (LOPRESSOR) 50 MG tablet Take 0.5 tablets (25 mg total) by mouth 2 (two) times daily. 180 tablet 3  . nitroGLYCERIN (NITROSTAT) 0.4 MG SL tablet Place 1 tablet (0.4 mg total) under the tongue every 5 (five) minutes as needed for chest pain. 25 tablet 3  . omega-3 acid ethyl esters (LOVAZA) 1 G capsule Take 1 g by mouth 2 (two) times daily.     Marland Kitchen omeprazole (PRILOSEC) 40 MG capsule Take 40 mg by mouth every morning.     Marland Kitchen oxybutynin (DITROPAN) 5 MG tablet Take 1 tablet (5 mg total) by mouth every 8 (eight) hours as needed for bladder spasms. 90 tablet 2  . oxyCODONE-acetaminophen (PERCOCET) 10-325 MG per tablet Take 1 tablet by mouth every 8 (eight) hours as needed for pain (for back  pain).   0  . Potassium Gluconate 595 MG CAPS Take 1 capsule by mouth daily.    Marland Kitchen sulfamethoxazole-trimethoprim (BACTRIM DS,SEPTRA DS) 800-160 MG per tablet Take 1 tablet by mouth 2 (two) times daily. (Patient not taking: Reported on 03/05/2015) 6 tablet 0  . zolpidem (AMBIEN) 10 MG tablet Take 10 mg by mouth at bedtime as needed. For sleep  2   No current facility-administered medications on file prior to visit.    Allergies  Allergen Reactions  . Vicodin [Hydrocodone-Acetaminophen] Hives    Family History  Problem Relation Age of Onset  . Thyroid disease Mother     hypothyroidism  . Heart attack Maternal Grandfather   . Heart Problems Father     pacermaker  . Edema Father   . Heart disease Maternal Grandmother   . Cancer Maternal Grandmother   . Diabetes Maternal Grandmother   . Hypertension Maternal Grandmother     BP 116/74 mmHg  Pulse 84  Temp(Src) 98.3 F (36.8 C)  Resp 16  Ht 5\' 10"  (1.778 m)  Wt 236 lb 9.6 oz (107.321 kg)  BMI 33.95 kg/m2  SpO2 97%  Review of Systems No weight change    Objective:   Physical Exam VITAL SIGNS:  See vs page GENERAL: no distress NECK: There is no palpable thyroid enlargement.  No thyroid nodule is palpable.  No palpable lymphadenopathy at the anterior neck.   Lab Results  Component Value Date   TSH 0.58 03/05/2015      Assessment & Plan:  Hypothyroidism, well-replaced.    Patient is advised the following: Patient Instructions  blood tests are requested for you today.  We'll let you know about the results.   There is a slight chance that the overactive thyroid could come back.    Please return in 1 year.    addendum: Please continue the same medication

## 2015-03-05 NOTE — Patient Instructions (Addendum)
blood tests are requested for you today.  We'll let you know about the results.   There is a slight chance that the overactive thyroid could come back.    Please return in 1 year.

## 2015-04-07 DIAGNOSIS — M5412 Radiculopathy, cervical region: Secondary | ICD-10-CM | POA: Insufficient documentation

## 2015-06-18 ENCOUNTER — Ambulatory Visit (INDEPENDENT_AMBULATORY_CARE_PROVIDER_SITE_OTHER): Payer: Medicaid Other | Admitting: Cardiology

## 2015-06-18 ENCOUNTER — Encounter: Payer: Self-pay | Admitting: Cardiology

## 2015-06-18 VITALS — BP 114/80 | HR 88 | Ht 70.0 in | Wt 245.8 lb

## 2015-06-18 DIAGNOSIS — I201 Angina pectoris with documented spasm: Secondary | ICD-10-CM

## 2015-06-18 DIAGNOSIS — I10 Essential (primary) hypertension: Secondary | ICD-10-CM | POA: Diagnosis not present

## 2015-06-18 DIAGNOSIS — I739 Peripheral vascular disease, unspecified: Secondary | ICD-10-CM | POA: Diagnosis not present

## 2015-06-18 NOTE — Progress Notes (Signed)
Cardiology Office Note   Date:  06/18/2015   ID:  [name], DOB Oct 04, 1965, MRN [id number]  PCP:  Charlynn Court, NP  Cardiologist: Darlin Coco MD  Chief Complaint  Patient presents with  . routine follow up    c/o chest pain      History of Present Illness: Jeff [name] is a 50 y.o. male who presents for  Scheduled follow-up visit  Jeff [name] is a 50 y.o. male with a hx of NSTEMI in 05/2012 2/2 probable coronary vasospasm, HTN, HL, post RAI hypothyroidism (Grave's Disease). He is doing well. He has occasional L sided chest pain that is atypical. He denies exertional chest pain. He has had a couple of back surgeries in the last year. Activity is somewhat limited. He notes dyspnea with more extreme activities. He is NYHA 2-2b. He denies orthopnea, PND, edema, syncope.  His main problem is difficulty with claudication in his left leg and foot.  He has previously been evaluated by Dr. Velva Harman. Initial decision was to pursue medical therapy but consider vascular intervention if he failed to improve.  He is able to walk about a block before he has to stop. He also has a lot of cramps in his left foot at night.  He has an appointment to see Dr. Mariea Clonts on February 14 2 or other options.  With emotional stress such as helping to look after his 32-year-old grandson with ADHD, he will sometimes get chest discomfort. Nitroglycerin helps  Patient is under a lot of stress.  He is not able to work at this time. He has previously applied for disability but was turned down. He has had a total of 5 back operations.  Dr. Newman Pies is his neurosurgeon.  Past Medical History  Diagnosis Date  . GERD (gastroesophageal reflux disease)   . Arthritis   . Chronic back pain     "from the neck to the lower back"   . Hypertension   . Hyperlipidemia   . History of Graves' disease     vs  Multinodular  . History of thyroid storm     Nov 2011  . Right ureteral stone   .  Hypothyroidism following radioiodine therapy     RAI in Mar 2012---  followed by dr Loanne Drilling  . History of panic attacks   . History of kidney stones   . History of non-ST elevation myocardial infarction (NSTEMI)     Jan 2014--  no CAD;  per notes probable coronary vasospasm  . History of septic shock     01-07-2015--  ureterolithias/ pyelonephritis  . History of acute pyelonephritis     01-07-2015  . Thrombocytopenia ([address])   . History of hiatal hernia   . PAD (peripheral artery disease) [facility]) cardiologist-  dr Fletcher Anon    a.  ABI (01/2014):  L 0.53; R 1.0 >> referred to PV  . 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 At risk for sleep apnea     STOP-BANG= 6       SENT TO PCP 01-22-2015    Past Surgical History  Procedure Laterality Date  . Anterior cervical decomp/discectomy fusion  09-08-2010    C3 -4  . Shoulder arthroscopy Right 03-08-2004    debridement labral tear/  DCR/  acromioplasty  . Posterior cervical fusion/foraminotomy N/A 12/10/2012    Procedure: POSTERIOR  CERVICAL FUSION/FORAMINOTOMY LEVEL 1;  Surgeon: Ophelia Charter, MD;  Location: [place] NEURO ORS;  Service: Neurosurgery;  Laterality: N/A;  Cervical three-four posterior cervical fusion with lateral mass screws  . Left heart catheterization with coronary angiogram N/A 06/12/2012    Procedure: LEFT HEART CATHETERIZATION WITH CORONARY ANGIOGRAM;  Surgeon: Josue Hector, MD;  Location: [facility] CATH LAB;  Service: Cardiovascular;  Laterality: N/A;   No sig. CAD/  normal LVF, ef 55-65%  . Abdominal aortagram N/A 02/19/2014    Procedure: ABDOMINAL Maxcine Ham;  Surgeon: Wellington Hampshire, MD;  Location: [facility] CATH LAB;  Service: Cardiovascular;  Laterality: N/A;  . Popliteal artery angioplasty Left 05/28/2014    dr Fletcher Anon    Attempted and unsuccessful due to inability to cross the occlusionnotes   . Umbilical hernia repair  1980  .  Posterior lumbar fusion  11-11-2009;   07-15-2013    L5 -- S1;   L4-- S1  . Lower extremity angiogram Left 05/28/2014    Procedure: LOWER EXTREMITY ANGIOGRAM;  Surgeon: Wellington Hampshire, MD;  Location: [place] CATH LAB;  Service: Cardiovascular;  Laterality: Left;  Failed PTA CTO  . Cystoscopy w/ ureteral stent placement Right 01/04/2015    Procedure: CYSTOSCOPY WITH RETROGRADE PYELOGRAM/URETERAL STENT PLACEMENT;  Surgeon: Franchot Gallo, MD;  Location: [place];  Service: Urology;  Laterality: Right;  . Laminectomy and microdiscectomy lumbar spine  03-26-2009    left L5 -- S1  . Transthoracic echocardiogram  01-05-2015    mild LVH/  ef 60-65%/  mild TR  . Cystoscopy/ureteroscopy/holmium laser/stent placement Right 01/26/2015    Procedure: CYSTOSCOPY/URETEROSCOPY RIGHT;  Surgeon: Franchot Gallo, MD;  Location: [facility];  Service: Urology;  Laterality: Right;  . Cystoscopy w/ ureteral stent removal Right 01/26/2015    Procedure: CYSTOSCOPY WITH STENT REMOVAL;  Surgeon: Franchot Gallo, MD;  Location: [facility];  Service: Urology;  Laterality: Right;     Current Outpatient Prescriptions  Medication Sig Dispense Refill  . amLODipine (NORVASC) 5 MG tablet Take 5 mg by mouth daily.    Marland Kitchen aspirin EC 81 MG tablet Take 81 mg by mouth daily.    Marland Kitchen atorvastatin (LIPITOR) 40 MG tablet Take 40 mg by mouth every evening.     . cilostazol (PLETAL) 100 MG tablet Take 100 mg by mouth 2 (two) times daily.    . clonazePAM (KLONOPIN) 2 MG tablet Take 2 mg by mouth every evening. Pt takes at 2pm    . escitalopram (LEXAPRO) 20 MG tablet Take 20 mg by mouth every morning.   5  . levothyroxine (SYNTHROID, LEVOTHROID) 112 MCG tablet Take 1 tablet (112 mcg total) by mouth daily before breakfast. 30 tablet 4  . Magnesium 250 MG TABS Take 250 mg by mouth daily.    . metoprolol (LOPRESSOR) 50 MG tablet Take 0.5 tablets (25 mg total) by mouth 2 (two) times daily. 180 tablet 3  .  nitroGLYCERIN (NITROSTAT) 0.4 MG SL tablet Place 1 tablet (0.4 mg total) under the tongue every 5 (five) minutes as needed for chest pain. 25 tablet 3  . omega-3 acid ethyl esters (LOVAZA) 1 G capsule Take 1 g by mouth 2 (two) times daily.     Marland Kitchen omeprazole (PRILOSEC) 40 MG capsule Take 40 mg by mouth every morning.     Marland Kitchen oxybutynin (DITROPAN) 5 MG tablet Take 1 tablet (5 mg total) by mouth every 8 (eight) hours as needed for bladder spasms. 90 tablet 2  . oxyCODONE-acetaminophen (PERCOCET) 10-325  MG per tablet Take 1 tablet by mouth every 8 (eight) hours as needed for pain (for back pain).   0  . Potassium Gluconate 595 MG CAPS Take 1 capsule by mouth daily.    . sildenafil (REVATIO) 20 MG tablet TAKE 2 TO 5 TABLETS BY MOUTH AS NEEDED  11  . tiZANidine (ZANAFLEX) 4 MG tablet Take 4 mg by mouth 2 (two) times daily as needed. Muscle spasms  2  . traZODone (DESYREL) 50 MG tablet Take 50 mg by mouth at bedtime.   2   No current facility-administered medications for this visit.    Allergies:   Vicodin    Social History:  The patient  reports that he has never smoked. He has never used smokeless tobacco. He reports that he drinks alcohol. He reports that he does not use illicit drugs.   Family History:  The patient's family history includes Cancer in his maternal grandmother; Diabetes in his maternal grandmother; Edema in his father; Heart Problems in his father; Heart attack in his maternal grandfather; Heart disease in his maternal grandmother; Hypertension in his maternal grandmother; Thyroid disease in his mother.    ROS:  Please see the history of present illness.   Otherwise, review of systems are positive for none.   All other systems are reviewed and negative.    PHYSICAL EXAM: VS:  BP 114/80 mmHg  Pulse 88  Ht 5\' 10"  (1.778 m)  Wt 245 lb 12.8 oz (111.494 kg)  BMI 35.27 kg/m2 , BMI Body mass index is 35.27 kg/(m^2). GEN: Well nourished, well developed, in no acute distress HEENT:  normal Neck: no JVD, carotid bruits, or masses Cardiac: RRR; no murmurs, rubs, or gallops,no edema  Respiratory:  clear to auscultation bilaterally, normal work of breathing GI: soft, nontender, nondistended, + BS MS: no deformity or atrophy Skin: warm and dry, no rash Neuro:  Strength and sensation are intact Psych: euthymic mood, full affect   EKG:  EKG is ordered today. The ekg ordered today demonstrates  Normal sinus rhythm. Old inferior wall scar.  Since prior tracing of 01/04/15, no significant change   Recent Labs: 10/24/2014: ALT 32 01/07/2015: BUN 7; Creatinine, Ser 1.25*; Hemoglobin 12.6*; Magnesium 1.8; Platelets 78*; Potassium 3.5; Sodium 137 03/05/2015: TSH 0.58    Lipid Panel    Component Value Date/Time   CHOL 107 06/12/2012 0637   TRIG 93 06/12/2012 0637   HDL 43 06/12/2012 0637   CHOLHDL 2.5 06/12/2012 0637   VLDL 19 06/12/2012 0637   LDLCALC 45 06/12/2012 0637   LDLDIRECT 43 06/11/2012 2246      Wt Readings from Last 3 Encounters:  06/18/15 245 lb 12.8 oz (111.494 kg)  03/05/15 236 lb 9.6 oz (107.321 kg)  01/28/15 238 lb (107.956 kg)        ASSESSMENT AND PLAN:  1. Coronary vasospasm: Overall stable. Continue amlodipine, ASA, statin. 2. Essential hypertension, benign:   satisfactory control. BP at home optimal. Continue to monitor.  3. HLD (hyperlipidemia): Labs check by PCP. Into new statin  4.  claudication of left leg. Followed by Dr. Fletcher Anon   Current medicines are reviewed at length with the patient today.  The patient does not have concerns regarding medicines.  The following changes have been made:  no change  Labs/ tests ordered today include:   Orders Placed This Encounter  Procedures  . EKG 12-Lead     disposition: Follow-up with Dr. Fletcher Anon as planned.  Continue current medication  Signed, [name]  Tristin Gladman MD 06/18/2015 1:43 PM    [facility] [place], [place], [place]  [address] Phone: [phone]; Fax: [phone]

## 2015-06-18 NOTE — Patient Instructions (Signed)
Medication Instructions:  Your physician recommends that you continue on your current medications as directed. Please refer to the Current Medication list given to you today.  Labwork: none  Testing/Procedures: none  Follow-Up: Keep your appointment with Dr Fletcher Anon as scheduled   If you need a refill on your cardiac medications before your next appointment, please call your pharmacy.

## 2015-06-19 ENCOUNTER — Other Ambulatory Visit: Payer: Self-pay | Admitting: Neurosurgery

## 2015-06-19 DIAGNOSIS — M542 Cervicalgia: Secondary | ICD-10-CM

## 2015-06-24 ENCOUNTER — Other Ambulatory Visit: Payer: Self-pay | Admitting: Cardiovascular Disease

## 2015-06-25 ENCOUNTER — Other Ambulatory Visit: Payer: Self-pay | Admitting: Cardiovascular Disease

## 2015-06-25 NOTE — Telephone Encounter (Signed)
Please review for refill, Thank you. 

## 2015-06-25 NOTE — Telephone Encounter (Signed)
Refill sent for amlodipine.  

## 2015-06-30 ENCOUNTER — Ambulatory Visit (INDEPENDENT_AMBULATORY_CARE_PROVIDER_SITE_OTHER): Payer: Medicaid Other | Admitting: Cardiovascular Disease

## 2015-06-30 ENCOUNTER — Encounter: Payer: Self-pay | Admitting: Cardiovascular Disease

## 2015-06-30 VITALS — BP 130/88 | HR 74 | Ht 70.0 in | Wt 245.9 lb

## 2015-06-30 DIAGNOSIS — E785 Hyperlipidemia, unspecified: Secondary | ICD-10-CM | POA: Diagnosis not present

## 2015-06-30 DIAGNOSIS — I739 Peripheral vascular disease, unspecified: Secondary | ICD-10-CM

## 2015-06-30 DIAGNOSIS — I10 Essential (primary) hypertension: Secondary | ICD-10-CM | POA: Diagnosis not present

## 2015-06-30 LAB — BASIC METABOLIC PANEL
BUN: 6 mg/dL — ABNORMAL LOW (ref 7–25)
CO2: 31 mmol/L (ref 20–31)
Calcium: 9.2 mg/dL (ref 8.6–10.3)
Chloride: 101 mmol/L (ref 98–110)
Creat: 1.21 mg/dL (ref 0.60–1.35)
Glucose, Bld: 81 mg/dL (ref 65–99)
Potassium: 4.1 mmol/L (ref 3.5–5.3)
Sodium: 139 mmol/L (ref 135–146)

## 2015-06-30 NOTE — Assessment & Plan Note (Signed)
Continue treatment with atorvastatin with a target LDL of less than 70. 
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
## 2015-06-30 NOTE — Assessment & Plan Note (Signed)
Blood pressure is well controlled on current medications. 

## 2015-06-30 NOTE — Patient Instructions (Signed)
Medication Instructions:  Your physician recommends that you continue on your current medications as directed. Please refer to the Current Medication list given to you today.  Labwork: Your physician recommends that you have lab work today: BMP   Testing/Procedures: Non-Cardiac CT Angiography (CTA), is a special type of CT scan that uses a computer to produce multi-dimensional views of major blood vessels throughout the body. In CT angiography, a contrast material is injected through an IV to help visualize the blood vessels. CTA Abdominal Aorta with Lower Extremity Runoff   Follow-Up: Your physician wants you to follow-up in: 6 MONTHS with Dr Fletcher Anon.  You will receive a reminder letter in the mail two months in advance. If you don't receive a letter, please call our office to schedule the follow-up appointment.   Any Other Special Instructions Will Be Listed Below (If Applicable).     If you need a refill on your cardiac medications before your next appointment, please call your pharmacy.

## 2015-06-30 NOTE — Assessment & Plan Note (Signed)
The patient has known severe left calf claudication due to occluded left popliteal artery which has been chronic for many years. He feels that this is interfering with his ability to work and he wants consideration of revascularization. I'm going to request CTA of the aorta with lower extremity runoff. I will then refer him to vascular surgery to consider

## 2015-07-01 ENCOUNTER — Ambulatory Visit
Admission: RE | Admit: 2015-07-01 | Discharge: 2015-07-01 | Disposition: A | Discharge status: Home / Self Care | Payer: Medicaid Other | Source: Ambulatory Visit | Attending: Neurosurgery | Admitting: Neurosurgery

## 2015-07-01 DIAGNOSIS — M542 Cervicalgia: Secondary | ICD-10-CM

## 2015-07-01 MED ORDER — GADOBENATE DIMEGLUMINE 529 MG/ML IV SOLN
20.0000 mL | Freq: Once | INTRAVENOUS | Status: AC | PRN
  Administered 2015-07-01: 20 mL via INTRAVENOUS

## 2015-07-07 ENCOUNTER — Ambulatory Visit (INDEPENDENT_AMBULATORY_CARE_PROVIDER_SITE_OTHER)
Admission: RE | Admit: 2015-07-07 | Discharge: 2015-07-07 | Disposition: A | Discharge status: Home / Self Care | Payer: Medicaid Other | Source: Ambulatory Visit | Attending: Cardiovascular Disease | Admitting: Cardiovascular Disease

## 2015-07-07 DIAGNOSIS — I739 Peripheral vascular disease, unspecified: Secondary | ICD-10-CM

## 2015-07-07 MED ORDER — IOHEXOL 350 MG/ML SOLN
120.0000 mL | Freq: Once | INTRAVENOUS | Status: AC | PRN
  Administered 2015-07-07: 120 mL via INTRAVENOUS

## 2015-07-22 ENCOUNTER — Other Ambulatory Visit: Payer: Self-pay

## 2015-07-22 DIAGNOSIS — I739 Peripheral vascular disease, unspecified: Secondary | ICD-10-CM

## 2015-07-23 ENCOUNTER — Telehealth: Payer: Self-pay | Admitting: Cardiovascular Disease

## 2015-07-23 NOTE — Telephone Encounter (Signed)
New message ° ° ° ° ° °Returning a call to the nurse to get test results °

## 2015-07-23 NOTE — Telephone Encounter (Signed)
-----   Message from Wellington Hampshire, MD sent at 07/17/2015  6:38 PM EST ----- Occluded left popliteal artery. I discussed with Dr. Arita Miss regarding possible surgery. Refer the patient to see Dr. Arita Miss specifically.

## 2015-07-23 NOTE — Telephone Encounter (Signed)
Informed patient's wife of results and verbal understanding expressed.  She understands Dr. Stephens Shire office will call to schedule OV.

## 2015-08-03 ENCOUNTER — Telehealth: Payer: Self-pay | Admitting: Cardiovascular Disease

## 2015-08-03 ENCOUNTER — Encounter: Payer: Self-pay | Admitting: Surgery

## 2015-08-03 NOTE — Telephone Encounter (Signed)
New message      Pt had a PCP appt approx 1-2weeks.  His hemacult card showed blood in his stool.  His PCP wanted pt to stop aspirin for 10 day.  Is this ok?

## 2015-08-03 NOTE — Telephone Encounter (Signed)
I spoke with the pt's wife and made her aware that the pt can stop ASA for 10 days.

## 2015-08-03 NOTE — Telephone Encounter (Signed)
That is fine 

## 2015-08-10 ENCOUNTER — Encounter: Payer: Self-pay | Admitting: Surgery

## 2015-08-10 ENCOUNTER — Ambulatory Visit (HOSPITAL_COMMUNITY)
Admission: RE | Admit: 2015-08-10 | Discharge: 2015-08-10 | Disposition: A | Discharge status: Home / Self Care | Payer: Medicaid Other | Source: Ambulatory Visit | Attending: Surgery | Admitting: Surgery

## 2015-08-10 ENCOUNTER — Ambulatory Visit (INDEPENDENT_AMBULATORY_CARE_PROVIDER_SITE_OTHER): Payer: Medicaid Other | Admitting: Surgery

## 2015-08-10 VITALS — BP 122/81 | HR 95 | Temp 96.8°F | Resp 14 | Ht 70.0 in | Wt 250.0 lb

## 2015-08-10 DIAGNOSIS — I739 Peripheral vascular disease, unspecified: Secondary | ICD-10-CM | POA: Insufficient documentation

## 2015-08-10 DIAGNOSIS — E785 Hyperlipidemia, unspecified: Secondary | ICD-10-CM | POA: Insufficient documentation

## 2015-08-10 DIAGNOSIS — I252 Old myocardial infarction: Secondary | ICD-10-CM | POA: Diagnosis not present

## 2015-08-10 DIAGNOSIS — I724 Aneurysm of artery of lower extremity: Secondary | ICD-10-CM | POA: Diagnosis not present

## 2015-08-10 DIAGNOSIS — I10 Essential (primary) hypertension: Secondary | ICD-10-CM | POA: Diagnosis not present

## 2015-08-10 DIAGNOSIS — K219 Gastro-esophageal reflux disease without esophagitis: Secondary | ICD-10-CM | POA: Diagnosis not present

## 2015-08-10 DIAGNOSIS — Z01810 Encounter for preprocedural cardiovascular examination: Secondary | ICD-10-CM

## 2015-08-10 NOTE — Progress Notes (Signed)
Patient name: Jeff [name] MRN: [id number] DOB: 06-14-1965 Sex: male   Referred by: Dr. Fletcher Anon  Reason for referral:  Chief Complaint  Patient presents with  . PVD    Occluded left popliteal artery   Ref- Dr. Fletcher Anon    HISTORY OF PRESENT ILLNESS: This is a 50 year old gentleman who comes in today with symptoms of left leg claudication.  The patient states that he has been having difficulty for approximately 9 years.  His leg symptoms were initially attributed to back issues.  He was evaluated by Dr. Velva Harman 2 years ago and underwent angiography which reveals a focal occlusion within the left popliteal artery.  This was attempted to be treated percutaneously but unsuccessful and now the patient comes in for discussions regarding surgical revascularization.  He comes in with a CT angiogram as his prior catheterization, which was antegrade, resulted in a large scrotal hematoma.  The patient states that he can walk approximately 250 before his leg becomes tight and painful.  He denies having any open wounds.  From a cardiac perspective, the patient has a history of an MI but at coronary angiography this was attributed to coronary spasm.  He is medically managed for hypercholesterolemia with a statin.  He is medically managed for hypertension.  He is a nonsmoker  Past Medical History  Diagnosis Date  . GERD (gastroesophageal reflux disease)   . Arthritis   . Chronic back pain     "from the neck to the lower back"   . Hypertension   . Hyperlipidemia   . History of Graves' disease     vs  Multinodular  . History of thyroid storm     Nov 2011  . Right ureteral stone   . Hypothyroidism following radioiodine therapy     RAI in Mar 2012---  followed by dr Loanne Drilling  . History of panic attacks   . History of kidney stones   . History of non-ST elevation myocardial infarction (NSTEMI)     Jan 2014--  no CAD;  per notes probable coronary vasospasm  . History of septic shock     01-07-2015--   ureterolithias/ pyelonephritis  . History of acute pyelonephritis     01-07-2015  . Thrombocytopenia ([place])   . History of hiatal hernia   . PAD (peripheral artery disease) [facility]) cardiologist-  dr Fletcher Anon    a.  ABI (01/2014):  L 0.53; R 1.0 >> referred to [facility]  . 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 At risk for sleep apnea     STOP-BANG= 6       SENT TO PCP 01-22-2015    Past Surgical History  Procedure Laterality Date  . Anterior cervical decomp/discectomy fusion  09-08-2010    C3 -4  . Shoulder arthroscopy Right 03-08-2004    debridement labral tear/  DCR/  acromioplasty  . Posterior cervical fusion/foraminotomy N/A 12/10/2012    Procedure: POSTERIOR CERVICAL FUSION/FORAMINOTOMY LEVEL 1;  Surgeon: Ophelia Charter, MD;  Location: [facility]URO ORS;  Service: Neurosurgery;  Laterality: N/A;  Cervical three-four posterior cervical fusion with lateral mass screws  . Left heart catheterization with coronary angiogram N/A 06/12/2012    Procedure: LEFT HEART CATHETERIZATION WITH CORONARY ANGIOGRAM;  Surgeon: Josue Hector, MD;  Location: [facility] CATH LAB;  Service: Cardiovascular;  Laterality: N/A;   No sig. CAD/  normal LVF, ef  55-65%  . Abdominal aortagram N/A 02/19/2014    Procedure: ABDOMINAL Maxcine Ham;  Surgeon: Wellington Hampshire, MD;  Location: [facility] CATH LAB;  Service: Cardiovascular;  Laterality: N/A;  . Popliteal artery angioplasty Left 05/28/2014    dr Fletcher Anon    Attempted and unsuccessful due to inability to cross the occlusionnotes   . Umbilical hernia repair  1980  . Posterior lumbar fusion  11-11-2009;   07-15-2013    L5 -- S1;   L4-- S1  . Lower extremity angiogram Left 05/28/2014    Procedure: LOWER EXTREMITY ANGIOGRAM;  Surgeon: Wellington Hampshire, MD;  Location: [facility] CATH LAB;  Service: Cardiovascular;  Laterality: Left;  Failed PTA CTO  . Cystoscopy w/ ureteral stent placement Right  01/04/2015    Procedure: CYSTOSCOPY WITH RETROGRADE PYELOGRAM/URETERAL STENT PLACEMENT;  Surgeon: Franchot Gallo, MD;  Location: [facility];  Service: Urology;  Laterality: Right;  . Laminectomy and microdiscectomy lumbar spine  03-26-2009    left L5 -- S1  . Transthoracic echocardiogram  01-05-2015    mild LVH/  ef 60-65%/  mild TR  . Cystoscopy/ureteroscopy/holmium laser/stent placement Right 01/26/2015    Procedure: CYSTOSCOPY/URETEROSCOPY RIGHT;  Surgeon: Franchot Gallo, MD;  Location: [facility];  Service: Urology;  Laterality: Right;  . Cystoscopy w/ ureteral stent removal Right 01/26/2015    Procedure: CYSTOSCOPY WITH STENT REMOVAL;  Surgeon: Franchot Gallo, MD;  Location: [facility];  Service: Urology;  Laterality: Right;    Social History   Social History  . Marital Status: Married    Spouse Name: N/A  . Number of Children: N/A  . Years of Education: N/A   Occupational History  . Disabled    Social History Main Topics  . Smoking status: Never Smoker   . Smokeless tobacco: Never Used  . Alcohol Use: 0.0 oz/week     Comment: RARE  . Drug Use: No  . Sexual Activity: Not on file   Other Topics Concern  . Not on file   Social History Narrative   Regular exercise-no   Disabled to due back problem    Family History  Problem Relation Age of Onset  . Thyroid disease Mother     hypothyroidism  . Heart attack Maternal Grandfather   . Heart Problems Father     pacermaker  . Edema Father   . Heart disease Maternal Grandmother   . Cancer Maternal Grandmother   . Diabetes Maternal Grandmother   . Hypertension Maternal Grandmother     Allergies as of 08/10/2015 - Review Complete 08/10/2015  Allergen Reaction Noted  . Vicodin [hydrocodone-acetaminophen] Hives 02/18/2014    Current Outpatient Prescriptions on File Prior to Visit  Medication Sig Dispense Refill  . amLODipine (NORVASC) 5 MG tablet Take 5 mg by mouth daily.    Marland Kitchen  aspirin EC 81 MG tablet Take 81 mg by mouth daily.    Marland Kitchen atorvastatin (LIPITOR) 40 MG tablet Take 40 mg by mouth every evening.     . cilostazol (PLETAL) 100 MG tablet Take 100 mg by mouth 2 (two) times daily.    Marland Kitchen escitalopram (LEXAPRO) 20 MG tablet Take 20 mg by mouth every morning.   5  . levothyroxine (SYNTHROID, LEVOTHROID) 112 MCG tablet Take 1 tablet (112 mcg total) by mouth daily before breakfast. 30 tablet 4  . metoprolol (LOPRESSOR) 50 MG tablet Take 0.5 tablets (25 mg total) by mouth 2 (two) times daily. 180 tablet 3  . nitroGLYCERIN (NITROSTAT) 0.4 MG SL tablet Place  1 tablet (0.4 mg total) under the tongue every 5 (five) minutes as needed for chest pain. 25 tablet 3  . omega-3 acid ethyl esters (LOVAZA) 1 G capsule Take 1 g by mouth 2 (two) times daily.     Marland Kitchen omeprazole (PRILOSEC) 40 MG capsule Take 40 mg by mouth every morning.     Marland Kitchen oxybutynin (DITROPAN) 5 MG tablet Take 1 tablet (5 mg total) by mouth every 8 (eight) hours as needed for bladder spasms. 90 tablet 2  . oxyCODONE-acetaminophen (PERCOCET) 10-325 MG per tablet Take 1 tablet by mouth every 8 (eight) hours as needed for pain (for back pain).   0  . sildenafil (REVATIO) 20 MG tablet TAKE 2 TO 5 TABLETS BY MOUTH AS NEEDED  11  . tiZANidine (ZANAFLEX) 4 MG tablet Take 4 mg by mouth 2 (two) times daily as needed. Muscle spasms  2  . clonazePAM (KLONOPIN) 2 MG tablet Take 2 mg by mouth every evening. Reported on 08/10/2015    . Magnesium 250 MG TABS Take 250 mg by mouth daily. Reported on 08/10/2015    . Potassium Gluconate 595 MG CAPS Take 1 capsule by mouth daily. Reported on 08/10/2015    . traZODone (DESYREL) 50 MG tablet Take 50 mg by mouth at bedtime. Reported on 08/10/2015  2   No current facility-administered medications on file prior to visit.     REVIEW OF SYSTEMS: See history of present illness for pertinent positives and negatives.  Otherwise all systems was negative  PHYSICAL EXAMINATION:  Filed Vitals:    08/10/15 1003  BP: 122/81  Pulse: 95  Temp: 96.8 F (36 C)  TempSrc: Oral  Resp: 14  Height: 5\' 10"  (1.778 m)  Weight: 250 lb (113.399 kg)  SpO2: 98%   Body mass index is 35.87 kg/(m^2). General: The patient appears their stated age.   HEENT:  No gross abnormalities Pulmonary: Respirations are non-labored Musculoskeletal: There are no major deformities.   Neurologic: No focal weakness or paresthesias are detected, Skin: There are no ulcer or rashes noted. Psychiatric: The patient has normal affect. Cardiovascular: There is a regular rate and rhythm without significant murmur appreciated.Nonpalpable left pedal pulse  Diagnostic Studies: I have reviewed his CT angiogram which reveals a left popliteal occlusion.  I have also reviewed his arteriogram from a year ago shows popliteal occlusion on the left  He had vein mapping performed today in the office which shows adequate left saphenous vein   Assessment:  Left popliteal occlusion Plan: The etiology of the occlusion is unknown.  Potentially this could have been thromboembolic, or it could be a popliteal entrapment.  Regardless, he is significantly symptomatic and would like to have this addressed.  Because of the location of the occlusion, I think he is going to need an above-knee to below-knee popliteal artery bypass graft.  In this setting, I think a posterior approach most likely would not provide adequate exposure.  I also discussed the possibility of exploring this from the above-knee popliteal exposure to see if I can get beyond the stenosis and treating this with a short interposition graft.  We discussed the risks and benefits of the operation including the risk of wound issues, long-term patency rates, cardiopulmonary complications.  All his questions were answered.  I have this scheduled for Friday, April 28.  He will need to be off of his Pletal for at least 1 week prior to his operation.     Eldridge Abrahams,  M.D.  [facility] Office: [phone] Pager:  [phone]

## 2015-08-11 ENCOUNTER — Encounter: Payer: Self-pay | Admitting: Internal Medicine

## 2015-08-20 ENCOUNTER — Other Ambulatory Visit: Payer: Self-pay

## 2015-08-31 DIAGNOSIS — M961 Postlaminectomy syndrome, not elsewhere classified: Secondary | ICD-10-CM | POA: Insufficient documentation

## 2015-09-01 ENCOUNTER — Other Ambulatory Visit (HOSPITAL_COMMUNITY): Payer: Self-pay | Admitting: *Deleted

## 2015-09-02 ENCOUNTER — Encounter (HOSPITAL_COMMUNITY)
Admission: RE | Admit: 2015-09-02 | Discharge: 2015-09-02 | Disposition: A | Discharge status: Home / Self Care | Payer: Medicaid Other | Source: Ambulatory Visit | Attending: Surgery | Admitting: Surgery

## 2015-09-07 ENCOUNTER — Encounter (HOSPITAL_COMMUNITY)
Admission: RE | Admit: 2015-09-07 | Discharge: 2015-09-07 | Disposition: A | Discharge status: Home / Self Care | Payer: Medicaid Other | Source: Ambulatory Visit | Attending: Surgery | Admitting: Surgery

## 2015-09-07 ENCOUNTER — Encounter (HOSPITAL_COMMUNITY): Payer: Self-pay

## 2015-09-07 DIAGNOSIS — Z79899 Other long term (current) drug therapy: Secondary | ICD-10-CM | POA: Diagnosis not present

## 2015-09-07 DIAGNOSIS — I739 Peripheral vascular disease, unspecified: Secondary | ICD-10-CM | POA: Insufficient documentation

## 2015-09-07 DIAGNOSIS — Z7902 Long term (current) use of antithrombotics/antiplatelets: Secondary | ICD-10-CM | POA: Insufficient documentation

## 2015-09-07 DIAGNOSIS — Z01818 Encounter for other preprocedural examination: Secondary | ICD-10-CM | POA: Insufficient documentation

## 2015-09-07 DIAGNOSIS — I252 Old myocardial infarction: Secondary | ICD-10-CM | POA: Diagnosis not present

## 2015-09-07 DIAGNOSIS — E785 Hyperlipidemia, unspecified: Secondary | ICD-10-CM | POA: Insufficient documentation

## 2015-09-07 DIAGNOSIS — Z01812 Encounter for preprocedural laboratory examination: Secondary | ICD-10-CM | POA: Insufficient documentation

## 2015-09-07 DIAGNOSIS — E039 Hypothyroidism, unspecified: Secondary | ICD-10-CM | POA: Insufficient documentation

## 2015-09-07 DIAGNOSIS — Z0183 Encounter for blood typing: Secondary | ICD-10-CM | POA: Insufficient documentation

## 2015-09-07 DIAGNOSIS — K219 Gastro-esophageal reflux disease without esophagitis: Secondary | ICD-10-CM | POA: Insufficient documentation

## 2015-09-07 DIAGNOSIS — I10 Essential (primary) hypertension: Secondary | ICD-10-CM | POA: Insufficient documentation

## 2015-09-07 DIAGNOSIS — Z981 Arthrodesis status: Secondary | ICD-10-CM | POA: Diagnosis not present

## 2015-09-07 DIAGNOSIS — Z7982 Long term (current) use of aspirin: Secondary | ICD-10-CM | POA: Insufficient documentation

## 2015-09-07 HISTORY — DX: Major depressive disorder, single episode, unspecified: F32.9

## 2015-09-07 HISTORY — DX: Anxiety disorder, unspecified: F41.9

## 2015-09-07 HISTORY — DX: Depression, unspecified: F32.A

## 2015-09-07 LAB — URINALYSIS, ROUTINE W REFLEX MICROSCOPIC
Bilirubin Urine: NEGATIVE
Glucose, UA: NEGATIVE mg/dL
Hgb urine dipstick: NEGATIVE
Ketones, ur: NEGATIVE mg/dL
Leukocytes, UA: NEGATIVE
Nitrite: NEGATIVE
Protein, ur: NEGATIVE mg/dL
Specific Gravity, Urine: 1.023 (ref 1.005–1.030)
pH: 5.5 (ref 5.0–8.0)

## 2015-09-07 LAB — COMPREHENSIVE METABOLIC PANEL
ALT: 37 U/L (ref 17–63)
AST: 30 U/L (ref 15–41)
Albumin: 3.6 g/dL (ref 3.5–5.0)
Alkaline Phosphatase: 76 U/L (ref 38–126)
Anion gap: 9 (ref 5–15)
BUN: 8 mg/dL (ref 6–20)
CO2: 24 mmol/L (ref 22–32)
Calcium: 8.5 mg/dL — ABNORMAL LOW (ref 8.9–10.3)
Chloride: 105 mmol/L (ref 101–111)
Creatinine, Ser: 1.16 mg/dL (ref 0.61–1.24)
GFR calc Af Amer: >60 mL/min (ref >60)
GFR calc non Af Amer: >60 mL/min (ref >60)
Glucose, Bld: 121 mg/dL — ABNORMAL HIGH (ref 65–99)
Potassium: 3.9 mmol/L (ref 3.5–5.1)
Sodium: 138 mmol/L (ref 135–145)
Total Bilirubin: 0.7 mg/dL (ref 0.3–1.2)
Total Protein: 6.6 g/dL (ref 6.5–8.1)

## 2015-09-07 LAB — CBC
HCT: 44.7 % (ref 39.0–52.0)
Hemoglobin: 14 g/dL (ref 13.0–17.0)
MCH: 26.7 pg (ref 26.0–34.0)
MCHC: 31.3 g/dL (ref 30.0–36.0)
MCV: 85.1 fL (ref 78.0–100.0)
Platelets: 190 10*3/uL (ref 150–400)
RBC: 5.25 MIL/uL (ref 4.22–5.81)
RDW: 12.9 % (ref 11.5–15.5)
WBC: 8.1 10*3/uL (ref 4.0–10.5)

## 2015-09-07 LAB — APTT: aPTT: 29 seconds (ref 24–37)

## 2015-09-07 LAB — TYPE AND SCREEN
ABO/RH(D): O POS
Antibody Screen: NEGATIVE

## 2015-09-07 LAB — PROTIME-INR
INR: 1.12 (ref 0.00–1.49)
Prothrombin Time: 14.6 seconds (ref 11.6–15.2)

## 2015-09-07 LAB — SURGICAL PCR SCREEN
MRSA, PCR: NEGATIVE
Staphylococcus aureus: NEGATIVE

## 2015-09-07 MED ORDER — CHLORHEXIDINE GLUCONATE 4 % EX LIQD: 60.0000 mL | Freq: Once | CUTANEOUS | Status: DC

## 2015-09-10 MED ORDER — SODIUM CHLORIDE 0.9 % IV SOLN: INTRAVENOUS | Status: DC

## 2015-09-10 MED ORDER — DEXTROSE 5 % IV SOLN
1.5000 g | INTRAVENOUS | Status: AC
  Administered 2015-09-11: 1.5 g via INTRAVENOUS
  Filled 2015-09-10: qty 1.5

## 2015-09-10 NOTE — Anesthesia Preprocedure Evaluation (Addendum)
Anesthesia Evaluation  Patient identified by MRN, date of birth, ID band Patient awake    Reviewed: Allergy & Precautions, NPO status , Patient's Chart, lab work & pertinent test results, reviewed documented beta blocker date and time   Airway Mallampati: II  TM Distance: >3 FB Neck ROM: Full    Dental  (+) Dental Advisory Given   Pulmonary neg pulmonary ROS,    breath sounds clear to auscultation       Cardiovascular hypertension, Pt. on medications and Pt. on home beta blockers + Past MI and + Peripheral Vascular Disease   Rhythm:Regular Rate:Normal  Echo 01/05/15:  - Left ventricle: The cavity size was normal. Wall thickness was increased in a pattern of mild LVH. Systolic function was normal. The estimated ejection fraction was in the range of 60% to 65%. Wall motion was normal; there were no regional wall motion abnormalities. Left ventricular diastolic function parameters were normal.  Cardiac cath 06/12/12: Normal coronary arteries.    Neuro/Psych Anxiety Depression negative neurological ROS     GI/Hepatic Neg liver ROS, hiatal hernia, GERD  ,  Endo/Other  Hypothyroidism   Renal/GU negative Renal ROS     Musculoskeletal  (+) Arthritis ,   Abdominal   Peds  Hematology negative hematology ROS (+)   Anesthesia Other Findings   Reproductive/Obstetrics                            Lab Results  Component Value Date   WBC 8.1 09/07/2015   HGB 14.0 09/07/2015   HCT 44.7 09/07/2015   MCV 85.1 09/07/2015   PLT 190 09/07/2015   Lab Results  Component Value Date   CREATININE 1.16 09/07/2015   BUN 8 09/07/2015   NA 138 09/07/2015   K 3.9 09/07/2015   CL 105 09/07/2015   CO2 24 09/07/2015    Anesthesia Physical Anesthesia Plan  ASA: III  Anesthesia Plan: General   Post-op Pain Management:    Induction: Intravenous  Airway Management Planned: Oral ETT  Additional Equipment:    Intra-op Plan:   Post-operative Plan: Extubation in OR  Informed Consent: I have reviewed the patients History and Physical, chart, labs and discussed the procedure including the risks, benefits and alternatives for the proposed anesthesia with the patient or authorized representative who has indicated his/her understanding and acceptance.   Dental advisory given  Plan Discussed with: CRNA  Anesthesia Plan Comments:         Anesthesia Quick Evaluation

## 2015-09-10 NOTE — Progress Notes (Signed)
Anesthesia Chart Review:  Pt is a 50 year old male scheduled for left fem-pop bypass graft on 09/11/2015 with Dr. Trula Slade.   Cardiologist is Dr. Mare Ferrari. [facility] cardiologist Dr. Fletcher Anon, who referred pt for surgery.   PMH includes:  NSTEMI, HTN, PAD, Graves' disease, hypothyroidism, hyperlipidemia, thrombocytopenia (during hospitalization for septic shock 12/2014), GERD. Never smoker. BMI 36. S/p PLIF 07/15/13. S/p cervical fusion 12/10/12.   Pt hospitalized 8/21-8/24/16 for UTI with septic shock secondary to ureterolithiasis  Medications include: amlodipine, ASA, lipitor, pletal, levothyroxine, metoprolol, sildenafil. Pt stopped pletal a week before surgery.   Preoperative labs reviewed.    1 view CXR 01/05/15: Left central venous catheter with tip over the cavoatrial junction region. No pneumothorax. No evidence of active pulmonary disease.  EKG 06/18/15: NSR. Inferior infarct, age undetermined. No significant change since 01/04/15 per Dr. Mare Ferrari.   Echo 01/05/15:  - Left ventricle: The cavity size was normal. Wall thickness was increased in a pattern of mild LVH. Systolic function was normal. The estimated ejection fraction was in the range of 60% to 65%. Wall motion was normal; there were no regional wall motion abnormalities. Left ventricular diastolic function parameters were normal.  Cardiac cath 06/12/12: Normal coronary arteries.   If no changes, I anticipate pt can proceed with surgery as scheduled.   Willeen Cass, FNP-BC [facility]/Anesthesiology Phone: [phone] 09/10/2015 1:09 PM

## 2015-09-11 ENCOUNTER — Encounter (HOSPITAL_COMMUNITY)
Admission: RE | Disposition: A | Discharge status: Home / Self Care | Payer: Self-pay | Source: Ambulatory Visit | Attending: Surgery

## 2015-09-11 ENCOUNTER — Inpatient Hospital Stay (HOSPITAL_COMMUNITY): Payer: Medicaid Other | Admitting: Anesthesiology

## 2015-09-11 ENCOUNTER — Inpatient Hospital Stay (HOSPITAL_COMMUNITY)
Admission: RE | Admit: 2015-09-11 | Discharge: 2015-09-12 | DRG: 254 | Disposition: A | Discharge status: Home / Self Care | Payer: Medicaid Other | Source: Ambulatory Visit | Attending: Surgery | Admitting: Surgery

## 2015-09-11 ENCOUNTER — Inpatient Hospital Stay (HOSPITAL_COMMUNITY): Payer: Medicaid Other | Admitting: Vascular Surgery

## 2015-09-11 ENCOUNTER — Encounter (HOSPITAL_COMMUNITY): Payer: Self-pay | Admitting: *Deleted

## 2015-09-11 DIAGNOSIS — E7800 Pure hypercholesterolemia, unspecified: Secondary | ICD-10-CM | POA: Diagnosis present

## 2015-09-11 DIAGNOSIS — E785 Hyperlipidemia, unspecified: Secondary | ICD-10-CM | POA: Diagnosis present

## 2015-09-11 DIAGNOSIS — F410 Panic disorder [episodic paroxysmal anxiety] without agoraphobia: Secondary | ICD-10-CM | POA: Diagnosis present

## 2015-09-11 DIAGNOSIS — G8929 Other chronic pain: Secondary | ICD-10-CM | POA: Diagnosis present

## 2015-09-11 DIAGNOSIS — E890 Postprocedural hypothyroidism: Secondary | ICD-10-CM | POA: Diagnosis present

## 2015-09-11 DIAGNOSIS — I7789 Other specified disorders of arteries and arterioles: Principal | ICD-10-CM | POA: Diagnosis present

## 2015-09-11 DIAGNOSIS — M549 Dorsalgia, unspecified: Secondary | ICD-10-CM | POA: Diagnosis present

## 2015-09-11 DIAGNOSIS — K219 Gastro-esophageal reflux disease without esophagitis: Secondary | ICD-10-CM | POA: Diagnosis present

## 2015-09-11 DIAGNOSIS — Z8249 Family history of ischemic heart disease and other diseases of the circulatory system: Secondary | ICD-10-CM

## 2015-09-11 DIAGNOSIS — I10 Essential (primary) hypertension: Secondary | ICD-10-CM | POA: Diagnosis present

## 2015-09-11 DIAGNOSIS — I252 Old myocardial infarction: Secondary | ICD-10-CM | POA: Diagnosis not present

## 2015-09-11 DIAGNOSIS — I739 Peripheral vascular disease, unspecified: Secondary | ICD-10-CM | POA: Diagnosis present

## 2015-09-11 DIAGNOSIS — I70212 Atherosclerosis of native arteries of extremities with intermittent claudication, left leg: Secondary | ICD-10-CM

## 2015-09-11 HISTORY — PX: FEMORAL-POPLITEAL BYPASS GRAFT: SHX937

## 2015-09-11 HISTORY — PX: VEIN HARVEST: SHX6363

## 2015-09-11 LAB — CBC
HCT: 39.3 % (ref 39.0–52.0)
Hemoglobin: 12.3 g/dL — ABNORMAL LOW (ref 13.0–17.0)
MCH: 27 pg (ref 26.0–34.0)
MCHC: 31.3 g/dL (ref 30.0–36.0)
MCV: 86.2 fL (ref 78.0–100.0)
Platelets: 187 10*3/uL (ref 150–400)
RBC: 4.56 MIL/uL (ref 4.22–5.81)
RDW: 13 % (ref 11.5–15.5)
WBC: 11.4 10*3/uL — ABNORMAL HIGH (ref 4.0–10.5)

## 2015-09-11 LAB — MRSA PCR SCREENING: MRSA by PCR: NEGATIVE

## 2015-09-11 LAB — CREATININE, SERUM
Creatinine, Ser: 1.18 mg/dL (ref 0.61–1.24)
GFR calc Af Amer: >60 mL/min (ref >60)
GFR calc non Af Amer: >60 mL/min (ref >60)

## 2015-09-11 SURGERY — BYPASS GRAFT FEMORAL-POPLITEAL ARTERY
Anesthesia: General | Laterality: Left

## 2015-09-11 MED ORDER — ONDANSETRON HCL 4 MG/2ML IJ SOLN
INTRAMUSCULAR | Status: DC | PRN
  Administered 2015-09-11: 4 mg via INTRAVENOUS

## 2015-09-11 MED ORDER — HYDROMORPHONE HCL 1 MG/ML IJ SOLN
INTRAMUSCULAR | Status: AC
  Filled 2015-09-11: qty 1

## 2015-09-11 MED ORDER — SODIUM CHLORIDE 0.9 % IV SOLN
INTRAVENOUS | Status: DC
  Administered 2015-09-11: 18:00:00 via INTRAVENOUS

## 2015-09-11 MED ORDER — ACETAMINOPHEN 650 MG RE SUPP: 325.0000 mg | RECTAL | Status: DC | PRN

## 2015-09-11 MED ORDER — PROPOFOL 10 MG/ML IV BOLUS
INTRAVENOUS | Status: DC | PRN
Start: 2015-09-11 — End: 2015-09-11
  Administered 2015-09-11: 250 mg via INTRAVENOUS

## 2015-09-11 MED ORDER — METOPROLOL TARTRATE 5 MG/5ML IV SOLN: 2.0000 mg | INTRAVENOUS | Status: DC | PRN

## 2015-09-11 MED ORDER — DIAZEPAM 5 MG PO TABS
10.0000 mg | ORAL_TABLET | Freq: Every evening | ORAL | Status: DC | PRN
  Administered 2015-09-11 (×2): 10 mg via ORAL
  Filled 2015-09-11: qty 2

## 2015-09-11 MED ORDER — ASPIRIN EC 81 MG PO TBEC
81.0000 mg | DELAYED_RELEASE_TABLET | Freq: Every day | ORAL | Status: DC
  Administered 2015-09-11 – 2015-09-12 (×2): 81 mg via ORAL
  Filled 2015-09-11 (×2): qty 1

## 2015-09-11 MED ORDER — GUAIFENESIN-DM 100-10 MG/5ML PO SYRP: 15.0000 mL | ORAL_SOLUTION | ORAL | Status: DC | PRN

## 2015-09-11 MED ORDER — OXYBUTYNIN CHLORIDE 5 MG PO TABS
5.0000 mg | ORAL_TABLET | Freq: Three times a day (TID) | ORAL | Status: DC | PRN
  Filled 2015-09-11: qty 1

## 2015-09-11 MED ORDER — AMLODIPINE BESYLATE 5 MG PO TABS
5.0000 mg | ORAL_TABLET | Freq: Every day | ORAL | Status: DC
  Administered 2015-09-11 – 2015-09-12 (×2): 5 mg via ORAL
  Filled 2015-09-11 (×2): qty 1

## 2015-09-11 MED ORDER — MORPHINE SULFATE (PF) 2 MG/ML IV SOLN
2.0000 mg | INTRAVENOUS | Status: DC | PRN
  Administered 2015-09-11: 2 mg via INTRAVENOUS
  Administered 2015-09-11 – 2015-09-12 (×2): 4 mg via INTRAVENOUS
  Administered 2015-09-12: 2 mg via INTRAVENOUS
  Filled 2015-09-11 (×2): qty 1
  Filled 2015-09-11 (×2): qty 2

## 2015-09-11 MED ORDER — ROCURONIUM BROMIDE 50 MG/5ML IV SOLN
INTRAVENOUS | Status: AC
  Filled 2015-09-11: qty 1

## 2015-09-11 MED ORDER — DOCUSATE SODIUM 100 MG PO CAPS
100.0000 mg | ORAL_CAPSULE | Freq: Every day | ORAL | Status: DC
  Administered 2015-09-12: 100 mg via ORAL
  Filled 2015-09-11: qty 1

## 2015-09-11 MED ORDER — EPHEDRINE SULFATE 50 MG/ML IJ SOLN
INTRAMUSCULAR | Status: DC | PRN
  Administered 2015-09-11: 10 mg via INTRAVENOUS

## 2015-09-11 MED ORDER — DIAZEPAM 5 MG PO TABS
ORAL_TABLET | ORAL | Status: AC
  Administered 2015-09-11: 10 mg via ORAL
  Filled 2015-09-11: qty 2

## 2015-09-11 MED ORDER — MAGNESIUM SULFATE 2 GM/50ML IV SOLN: 2.0000 g | Freq: Every day | INTRAVENOUS | Status: DC | PRN

## 2015-09-11 MED ORDER — TAMSULOSIN HCL 0.4 MG PO CAPS
0.4000 mg | ORAL_CAPSULE | Freq: Every day | ORAL | Status: DC
  Administered 2015-09-11 – 2015-09-12 (×2): 0.4 mg via ORAL
  Filled 2015-09-11 (×2): qty 1

## 2015-09-11 MED ORDER — DEXTROSE 5 % IV SOLN
1.5000 g | Freq: Two times a day (BID) | INTRAVENOUS | Status: AC
  Administered 2015-09-11 – 2015-09-12 (×2): 1.5 g via INTRAVENOUS
  Filled 2015-09-11 (×2): qty 1.5

## 2015-09-11 MED ORDER — LACTATED RINGERS IV SOLN
INTRAVENOUS | Status: DC | PRN
  Administered 2015-09-11 (×2): via INTRAVENOUS

## 2015-09-11 MED ORDER — METOPROLOL TARTRATE 25 MG PO TABS
25.0000 mg | ORAL_TABLET | Freq: Two times a day (BID) | ORAL | Status: DC
  Administered 2015-09-11 – 2015-09-12 (×2): 25 mg via ORAL
  Filled 2015-09-11 (×2): qty 1

## 2015-09-11 MED ORDER — ESCITALOPRAM OXALATE 20 MG PO TABS
20.0000 mg | ORAL_TABLET | Freq: Every morning | ORAL | Status: DC
  Administered 2015-09-12: 20 mg via ORAL
  Filled 2015-09-11: qty 1

## 2015-09-11 MED ORDER — HEMOSTATIC AGENTS (NO CHARGE) OPTIME
TOPICAL | Status: DC | PRN
  Administered 2015-09-11: 1 via TOPICAL

## 2015-09-11 MED ORDER — ATORVASTATIN CALCIUM 40 MG PO TABS
40.0000 mg | ORAL_TABLET | Freq: Every evening | ORAL | Status: DC
  Administered 2015-09-11: 40 mg via ORAL
  Filled 2015-09-11: qty 1

## 2015-09-11 MED ORDER — ENOXAPARIN SODIUM 40 MG/0.4ML ~~LOC~~ SOLN
40.0000 mg | SUBCUTANEOUS | Status: DC
  Administered 2015-09-12: 40 mg via SUBCUTANEOUS
  Filled 2015-09-11: qty 0.4

## 2015-09-11 MED ORDER — ONDANSETRON HCL 4 MG/2ML IJ SOLN
INTRAMUSCULAR | Status: AC
  Filled 2015-09-11: qty 2

## 2015-09-11 MED ORDER — NITROGLYCERIN 0.4 MG SL SUBL: 0.4000 mg | SUBLINGUAL_TABLET | SUBLINGUAL | Status: DC | PRN

## 2015-09-11 MED ORDER — POTASSIUM CHLORIDE CRYS ER 20 MEQ PO TBCR: 20.0000 meq | EXTENDED_RELEASE_TABLET | Freq: Every day | ORAL | Status: DC | PRN

## 2015-09-11 MED ORDER — FENTANYL CITRATE (PF) 250 MCG/5ML IJ SOLN
INTRAMUSCULAR | Status: AC
  Filled 2015-09-11: qty 5

## 2015-09-11 MED ORDER — OXYCODONE HCL 5 MG PO TABS
ORAL_TABLET | ORAL | Status: AC
  Administered 2015-09-11: 5 mg via ORAL
  Filled 2015-09-11: qty 1

## 2015-09-11 MED ORDER — ONDANSETRON HCL 4 MG/2ML IJ SOLN: 4.0000 mg | Freq: Four times a day (QID) | INTRAMUSCULAR | Status: DC | PRN

## 2015-09-11 MED ORDER — HEPARIN SODIUM (PORCINE) 1000 UNIT/ML IJ SOLN
INTRAMUSCULAR | Status: DC | PRN
  Administered 2015-09-11: 10000 [IU] via INTRAVENOUS
  Administered 2015-09-11: 1000 [IU] via INTRAVENOUS

## 2015-09-11 MED ORDER — MIDAZOLAM HCL 2 MG/2ML IJ SOLN
INTRAMUSCULAR | Status: AC
  Filled 2015-09-11: qty 2

## 2015-09-11 MED ORDER — PHENOL 1.4 % MT LIQD: 1.0000 | OROMUCOSAL | Status: DC | PRN

## 2015-09-11 MED ORDER — ACETAMINOPHEN 325 MG PO TABS
325.0000 mg | ORAL_TABLET | ORAL | Status: DC | PRN
  Administered 2015-09-11 – 2015-09-12 (×2): 650 mg via ORAL
  Filled 2015-09-11 (×2): qty 2

## 2015-09-11 MED ORDER — 0.9 % SODIUM CHLORIDE (POUR BTL) OPTIME
TOPICAL | Status: DC | PRN
  Administered 2015-09-11: 2000 mL

## 2015-09-11 MED ORDER — SODIUM CHLORIDE 0.9 % IV SOLN: 500.0000 mL | Freq: Once | INTRAVENOUS | Status: DC | PRN

## 2015-09-11 MED ORDER — SODIUM CHLORIDE 0.9 % IV SOLN
INTRAVENOUS | Status: DC | PRN
  Administered 2015-09-11: 09:00:00

## 2015-09-11 MED ORDER — TIZANIDINE HCL 2 MG PO TABS: 4.0000 mg | ORAL_TABLET | Freq: Two times a day (BID) | ORAL | Status: DC | PRN

## 2015-09-11 MED ORDER — OXYCODONE-ACETAMINOPHEN 5-325 MG PO TABS
ORAL_TABLET | ORAL | Status: AC
  Administered 2015-09-12: 1 via ORAL
  Filled 2015-09-11: qty 1

## 2015-09-11 MED ORDER — HYDRALAZINE HCL 20 MG/ML IJ SOLN: 5.0000 mg | INTRAMUSCULAR | Status: DC | PRN

## 2015-09-11 MED ORDER — SUGAMMADEX SODIUM 200 MG/2ML IV SOLN
INTRAVENOUS | Status: DC | PRN
  Administered 2015-09-11: 250 mg via INTRAVENOUS

## 2015-09-11 MED ORDER — HYDROMORPHONE HCL 1 MG/ML IJ SOLN
0.5000 mg | INTRAMUSCULAR | Status: DC | PRN
  Administered 2015-09-11 (×2): 0.5 mg via INTRAVENOUS

## 2015-09-11 MED ORDER — PROTAMINE SULFATE 10 MG/ML IV SOLN
INTRAVENOUS | Status: DC | PRN
  Administered 2015-09-11: 50 mg via INTRAVENOUS

## 2015-09-11 MED ORDER — OXYCODONE HCL 5 MG PO TABS
5.0000 mg | ORAL_TABLET | ORAL | Status: DC | PRN
  Administered 2015-09-11 – 2015-09-12 (×4): 5 mg via ORAL
  Filled 2015-09-11 (×3): qty 1

## 2015-09-11 MED ORDER — OXYCODONE-ACETAMINOPHEN 5-325 MG PO TABS
1.0000 | ORAL_TABLET | ORAL | Status: DC | PRN
  Administered 2015-09-11 – 2015-09-12 (×2): 1 via ORAL
  Filled 2015-09-11: qty 1

## 2015-09-11 MED ORDER — PHENYLEPHRINE HCL 10 MG/ML IJ SOLN
10.0000 mg | INTRAVENOUS | Status: DC | PRN
  Administered 2015-09-11: 15 ug/min via INTRAVENOUS

## 2015-09-11 MED ORDER — OXYCODONE-ACETAMINOPHEN 10-325 MG PO TABS: 1.0000 | ORAL_TABLET | ORAL | Status: DC | PRN

## 2015-09-11 MED ORDER — IOPAMIDOL (ISOVUE-300) INJECTION 61%
INTRAVENOUS | Status: AC
  Filled 2015-09-11: qty 50

## 2015-09-11 MED ORDER — LABETALOL HCL 5 MG/ML IV SOLN: 10.0000 mg | INTRAVENOUS | Status: DC | PRN

## 2015-09-11 MED ORDER — LEVOTHYROXINE SODIUM 112 MCG PO TABS
112.0000 ug | ORAL_TABLET | Freq: Every day | ORAL | Status: DC
  Administered 2015-09-12: 112 ug via ORAL
  Filled 2015-09-11: qty 1

## 2015-09-11 MED ORDER — PROPOFOL 10 MG/ML IV BOLUS
INTRAVENOUS | Status: AC
  Filled 2015-09-11: qty 40

## 2015-09-11 MED ORDER — OMEGA-3-ACID ETHYL ESTERS 1 G PO CAPS
1.0000 g | ORAL_CAPSULE | Freq: Two times a day (BID) | ORAL | Status: DC
  Administered 2015-09-11 – 2015-09-12 (×3): 1 g via ORAL
  Filled 2015-09-11 (×3): qty 1

## 2015-09-11 MED ORDER — LIDOCAINE HCL (CARDIAC) 20 MG/ML IV SOLN
INTRAVENOUS | Status: DC | PRN
  Administered 2015-09-11: 100 mg via INTRAVENOUS

## 2015-09-11 MED ORDER — FENTANYL CITRATE (PF) 100 MCG/2ML IJ SOLN
INTRAMUSCULAR | Status: DC | PRN
  Administered 2015-09-11 (×2): 50 ug via INTRAVENOUS
  Administered 2015-09-11: 100 ug via INTRAVENOUS
  Administered 2015-09-11: 50 ug via INTRAVENOUS

## 2015-09-11 MED ORDER — OXYCODONE-ACETAMINOPHEN 10-325 MG PO TABS: 1.0000 | ORAL_TABLET | Freq: Four times a day (QID) | ORAL | Status: DC | PRN

## 2015-09-11 MED ORDER — PANTOPRAZOLE SODIUM 40 MG PO TBEC
80.0000 mg | DELAYED_RELEASE_TABLET | Freq: Every day | ORAL | Status: DC
  Administered 2015-09-12: 80 mg via ORAL
  Filled 2015-09-11: qty 2

## 2015-09-11 MED ORDER — PHENYLEPHRINE HCL 10 MG/ML IJ SOLN
INTRAMUSCULAR | Status: DC | PRN
  Administered 2015-09-11 (×2): 80 ug via INTRAVENOUS

## 2015-09-11 MED ORDER — ROCURONIUM BROMIDE 100 MG/10ML IV SOLN
INTRAVENOUS | Status: DC | PRN
  Administered 2015-09-11: 50 mg via INTRAVENOUS

## 2015-09-11 MED ORDER — ALUM & MAG HYDROXIDE-SIMETH 200-200-20 MG/5ML PO SUSP
15.0000 mL | ORAL | Status: DC | PRN
Start: 2015-09-11 — End: 2015-09-12

## 2015-09-11 MED ORDER — HYDROMORPHONE HCL 1 MG/ML IJ SOLN
0.2500 mg | INTRAMUSCULAR | Status: DC | PRN
  Administered 2015-09-11 (×4): 0.5 mg via INTRAVENOUS

## 2015-09-11 MED ORDER — VECURONIUM BROMIDE 10 MG IV SOLR
INTRAVENOUS | Status: DC | PRN
  Administered 2015-09-11 (×4): 2 mg via INTRAVENOUS

## 2015-09-11 MED ORDER — MIDAZOLAM HCL 5 MG/5ML IJ SOLN
INTRAMUSCULAR | Status: DC | PRN
  Administered 2015-09-11: 2 mg via INTRAVENOUS

## 2015-09-11 SURGICAL SUPPLY — 61 items
BANDAGE ESMARK 6X9 LF (GAUZE/BANDAGES/DRESSINGS) IMPLANT
BNDG CMPR 9X6 STRL LF SNTH (GAUZE/BANDAGES/DRESSINGS)
BNDG ESMARK 6X9 LF (GAUZE/BANDAGES/DRESSINGS)
CANISTER SUCTION 2500CC (MISCELLANEOUS) ×3 IMPLANT
CANNULA VESSEL 3MM 2 BLNT TIP (CANNULA) ×2 IMPLANT
CLIP TI MEDIUM 24 (CLIP) ×3 IMPLANT
CLIP TI WIDE RED SMALL 24 (CLIP) ×5 IMPLANT
COVER PROBE W GEL 5X96 (DRAPES) ×2 IMPLANT
CUFF TOURNIQUET SINGLE 24IN (TOURNIQUET CUFF) IMPLANT
CUFF TOURNIQUET SINGLE 34IN LL (TOURNIQUET CUFF) IMPLANT
CUFF TOURNIQUET SINGLE 44IN (TOURNIQUET CUFF) IMPLANT
DRAIN CHANNEL 15F RND FF W/TCR (WOUND CARE) IMPLANT
DRAPE X-RAY CASS 24X20 (DRAPES) IMPLANT
ELECT REM PT RETURN 9FT ADLT (ELECTROSURGICAL) ×3
ELECTRODE REM PT RTRN 9FT ADLT (ELECTROSURGICAL) ×1 IMPLANT
EVACUATOR SILICONE 100CC (DRAIN) IMPLANT
GLOVE BIO SURGEON STRL SZ 6.5 (GLOVE) ×1 IMPLANT
GLOVE BIO SURGEONS STRL SZ 6.5 (GLOVE) ×1
GLOVE BIOGEL PI IND STRL 6.5 (GLOVE) IMPLANT
GLOVE BIOGEL PI IND STRL 7.0 (GLOVE) IMPLANT
GLOVE BIOGEL PI IND STRL 7.5 (GLOVE) ×1 IMPLANT
GLOVE BIOGEL PI INDICATOR 6.5 (GLOVE) ×6
GLOVE BIOGEL PI INDICATOR 7.0 (GLOVE) ×4
GLOVE BIOGEL PI INDICATOR 7.5 (GLOVE) ×2
GLOVE SURG SS PI 6.5 STRL IVOR (GLOVE) ×4 IMPLANT
GLOVE SURG SS PI 7.0 STRL IVOR (GLOVE) ×2 IMPLANT
GLOVE SURG SS PI 7.5 STRL IVOR (GLOVE) ×3 IMPLANT
GOWN STRL REUS W/ TWL LRG LVL3 (GOWN DISPOSABLE) ×2 IMPLANT
GOWN STRL REUS W/ TWL XL LVL3 (GOWN DISPOSABLE) ×1 IMPLANT
GOWN STRL REUS W/TWL LRG LVL3 (GOWN DISPOSABLE) ×9
GOWN STRL REUS W/TWL XL LVL3 (GOWN DISPOSABLE) ×6
HEMOSTAT SNOW SURGICEL 2X4 (HEMOSTASIS) ×2 IMPLANT
KIT BASIN OR (CUSTOM PROCEDURE TRAY) ×3 IMPLANT
KIT ROOM TURNOVER OR (KITS) ×3 IMPLANT
LIQUID BAND (GAUZE/BANDAGES/DRESSINGS) ×5 IMPLANT
MARKER SKIN DUAL TIP RULER LAB (MISCELLANEOUS) ×2 IMPLANT
NS IRRIG 1000ML POUR BTL (IV SOLUTION) ×6 IMPLANT
PACK PERIPHERAL VASCULAR (CUSTOM PROCEDURE TRAY) ×3 IMPLANT
PAD ARMBOARD 7.5X6 YLW CONV (MISCELLANEOUS) ×6 IMPLANT
SET COLLECT BLD 21X3/4 12 (NEEDLE) IMPLANT
STOPCOCK 4 WAY LG BORE MALE ST (IV SETS) IMPLANT
SUT ETHILON 3 0 PS 1 (SUTURE) IMPLANT
SUT PROLENE 5 0 C 1 24 (SUTURE) ×5 IMPLANT
SUT PROLENE 6 0 BV (SUTURE) ×5 IMPLANT
SUT PROLENE 7 0 BV 1 (SUTURE) IMPLANT
SUT SILK 2 0 SH (SUTURE) ×3 IMPLANT
SUT SILK 3 0 (SUTURE) ×3
SUT SILK 3-0 18XBRD TIE 12 (SUTURE) IMPLANT
SUT SILK 4 0 (SUTURE) ×3
SUT SILK 4-0 18XBRD TIE 12 (SUTURE) IMPLANT
SUT VIC AB 2-0 CT1 27 (SUTURE) ×6
SUT VIC AB 2-0 CT1 TAPERPNT 27 (SUTURE) ×2 IMPLANT
SUT VIC AB 3-0 SH 27 (SUTURE) ×12
SUT VIC AB 3-0 SH 27X BRD (SUTURE) ×2 IMPLANT
SUT VICRYL 4-0 PS2 18IN ABS (SUTURE) ×10 IMPLANT
TAPE UMBILICAL COTTON 1/8X30 (MISCELLANEOUS) ×2 IMPLANT
TOWEL OR 17X26 10 PK STRL BLUE (TOWEL DISPOSABLE) ×2 IMPLANT
TRAY FOLEY W/METER SILVER 16FR (SET/KITS/TRAYS/PACK) ×3 IMPLANT
TUBING EXTENTION W/L.L. (IV SETS) IMPLANT
UNDERPAD 30X30 INCONTINENT (UNDERPADS AND DIAPERS) ×3 IMPLANT
WATER STERILE IRR 1000ML POUR (IV SOLUTION) ×3 IMPLANT

## 2015-09-11 NOTE — Progress Notes (Signed)
  [facility] Day of [facility] Note  Subjective:  Left leg incisions sore.   Filed Vitals:   09/11/15 1305 09/11/15 1320  BP: 114/79 109/71  Pulse: 84 82  Temp:    Resp: 12 7    Left leg incisions clean and intact. No hematoma.  Brisk DP and PT doppler flow.   Assessment/Plan:  This is a 50 y.o. male who is s/p left above knee to below knee popliteal bypass.   Doing well post-operatively. Good doppler flow to left foot.  To 3S when bed available.    Virgina Jock, [place] Pager: [phone] 09/11/2015 2:26 PM

## 2015-09-11 NOTE — Transfer of Care (Signed)
Immediate Anesthesia Transfer of Care Note  Patient: Jeff [name]  Procedure(s) Performed: Procedure(s): BYPASS GRAFT LEFT ABOVE KNEE TO BELOW KNEE POPLITEAL ARTERY WITH LEFT GREATER SAPHENOUS VEIN (Left) LEFT GREATER SAPHENOUS VEIN HARVEST (Left)  Patient Location: PACU  Anesthesia Type:General  Level of Consciousness: awake, alert  and oriented  Airway & Oxygen Therapy: Patient Spontanous Breathing and Patient connected to nasal cannula oxygen  Post-op Assessment: Report given to RN, Post -op Vital signs reviewed and stable and Patient moving all extremities X 4  Post vital signs: Reviewed and stable  Last Vitals:  Filed Vitals:   09/11/15 0604  BP: 115/68  Pulse: 76  Temp: 36.8 C  Resp: 20    Last Pain: There were no vitals filed for this visit.    Patients Stated Pain Goal: 3 ([id number])  Complications: No apparent anesthesia complications

## 2015-09-11 NOTE — H&P (Signed)
Patient name: Jhayden Kutcher SpinksMRN: [id number] DOB: 04/21/1967Sex: male   Referred by: Dr. Fletcher Anon  Reason for referral:  Chief Complaint  Patient presents with  . PVD    Occluded left popliteal artery Ref- Dr. Fletcher Anon    HISTORY OF PRESENT ILLNESS: This is a 50 year old gentleman who comes in today with symptoms of left leg claudication. The patient states that he has been having difficulty for approximately 9 years. His leg symptoms were initially attributed to back issues. He was evaluated by Dr. Velva Harman 2 years ago and underwent angiography which reveals a focal occlusion within the left popliteal artery. This was attempted to be treated percutaneously but unsuccessful and now the patient comes in for discussions regarding surgical revascularization. He comes in with a CT angiogram as his prior catheterization, which was antegrade, resulted in a large scrotal hematoma. The patient states that he can walk approximately 250 before his leg becomes tight and painful. He denies having any open wounds.  From a cardiac perspective, the patient has a history of an MI but at coronary angiography this was attributed to coronary spasm. He is medically managed for hypercholesterolemia with a statin. He is medically managed for hypertension. He is a nonsmoker  Past Medical History  Diagnosis Date  . GERD (gastroesophageal reflux disease)   . Arthritis   . Chronic back pain     "from the neck to the lower back"   . Hypertension   . Hyperlipidemia   . History of Graves' disease     vs Multinodular  . History of thyroid storm     Nov 2011  . Right ureteral stone   . Hypothyroidism following radioiodine therapy     RAI in Mar 2012--- followed by dr Loanne Drilling  . History of panic attacks   . History of kidney stones   . History of non-ST elevation myocardial infarction (NSTEMI)     Jan 2014-- no  CAD; per notes probable coronary vasospasm  . History of septic shock     01-07-2015-- ureterolithias/ pyelonephritis  . History of acute pyelonephritis     01-07-2015  . Thrombocytopenia ([place])   . History of hiatal hernia   . PAD (peripheral artery disease) [facility]) cardiologist- dr Fletcher Anon    a. ABI (01/2014): L 0.53; R 1.0 >> referred to [facility]  . 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 At risk for sleep apnea     STOP-BANG= 6 SENT TO PCP 01-22-2015    Past Surgical History  Procedure Laterality Date  . Anterior cervical decomp/discectomy fusion  09-08-2010    C3 -4  . Shoulder arthroscopy Right 03-08-2004    debridement labral tear/ DCR/ acromioplasty  . Posterior cervical fusion/foraminotomy N/A 12/10/2012    Procedure: POSTERIOR CERVICAL FUSION/FORAMINOTOMY LEVEL 1; Surgeon: Ophelia Charter, MD; Location: [facility]; Service: Neurosurgery; Laterality: N/A; Cervical three-four posterior cervical fusion with lateral mass screws  . Left heart catheterization with coronary angiogram N/A 06/12/2012    Procedure: LEFT HEART CATHETERIZATION WITH CORONARY ANGIOGRAM; Surgeon: Josue Hector, MD; Location: [facility]; Service: Cardiovascular; Laterality: N/A; No sig. CAD/ normal LVF, ef 55-65%  . Abdominal aortagram N/A 02/19/2014    Procedure: ABDOMINAL Maxcine Ham; Surgeon: Wellington Hampshire, MD; Location: [facility] CATH LAB; Service: Cardiovascular; Laterality: N/A;  . Popliteal artery angioplasty Left 05/28/2014 dr Fletcher Anon    Attempted and unsuccessful due to inability to cross  the occlusionnotes   . Umbilical hernia repair  1980  . Posterior lumbar fusion  11-11-2009; 07-15-2013    L5 -- S1; L4-- S1  . Lower extremity angiogram Left 05/28/2014    Procedure:  LOWER EXTREMITY ANGIOGRAM; Surgeon: Wellington Hampshire, MD; Location: [facility]; Service: Cardiovascular; Laterality: Left; Failed PTA CTO  . Cystoscopy w/ ureteral stent placement Right 01/04/2015    Procedure: CYSTOSCOPY WITH RETROGRADE PYELOGRAM/URETERAL STENT PLACEMENT; Surgeon: Franchot Gallo, MD; Location: [facility]; Service: Urology; Laterality: Right;  . Laminectomy and microdiscectomy lumbar spine  03-26-2009    left L5 -- S1  . Transthoracic echocardiogram  01-05-2015    mild LVH/ ef 60-65%/ mild TR  . Cystoscopy/ureteroscopy/holmium laser/stent placement Right 01/26/2015    Procedure: CYSTOSCOPY/URETEROSCOPY RIGHT; Surgeon: Franchot Gallo, MD; Location: [facility]; Service: Urology; Laterality: Right;  . Cystoscopy w/ ureteral stent removal Right 01/26/2015    Procedure: CYSTOSCOPY WITH STENT REMOVAL; Surgeon: Franchot Gallo, MD; Location: [facility]; Service: Urology; Laterality: Right;    Social History   Social History  . Marital Status: Married    Spouse Name: N/A  . Number of Children: N/A  . Years of Education: N/A   Occupational History  . Disabled    Social History Main Topics  . Smoking status: Never Smoker   . Smokeless tobacco: Never Used  . Alcohol Use: 0.0 oz/week     Comment: RARE  . Drug Use: No  . Sexual Activity: Not on file   Other Topics Concern  . Not on file   Social History Narrative   Regular exercise-no   Disabled to due back problem    Family History  Problem Relation Age of Onset  . Thyroid disease Mother     hypothyroidism  . Heart attack Maternal Grandfather   . Heart Problems Father     pacermaker  . Edema Father   . Heart disease Maternal Grandmother   . Cancer Maternal Grandmother   . Diabetes Maternal Grandmother   . Hypertension  Maternal Grandmother     Allergies as of 08/10/2015 - Review Complete 08/10/2015  Allergen Reaction Noted  . Vicodin [hydrocodone-acetaminophen] Hives 02/18/2014    Current Outpatient Prescriptions on File Prior to Visit  Medication Sig Dispense Refill  . amLODipine (NORVASC) 5 MG tablet Take 5 mg by mouth daily.    Marland Kitchen aspirin EC 81 MG tablet Take 81 mg by mouth daily.    Marland Kitchen atorvastatin (LIPITOR) 40 MG tablet Take 40 mg by mouth every evening.     . cilostazol (PLETAL) 100 MG tablet Take 100 mg by mouth 2 (two) times daily.    Marland Kitchen escitalopram (LEXAPRO) 20 MG tablet Take 20 mg by mouth every morning.   5  . levothyroxine (SYNTHROID, LEVOTHROID) 112 MCG tablet Take 1 tablet (112 mcg total) by mouth daily before breakfast. 30 tablet 4  . metoprolol (LOPRESSOR) 50 MG tablet Take 0.5 tablets (25 mg total) by mouth 2 (two) times daily. 180 tablet 3  . nitroGLYCERIN (NITROSTAT) 0.4 MG SL tablet Place 1 tablet (0.4 mg total) under the tongue every 5 (five) minutes as needed for chest pain. 25 tablet 3  . omega-3 acid ethyl esters (LOVAZA) 1 G capsule Take 1 g by mouth 2 (two) times daily.     Marland Kitchen omeprazole (PRILOSEC) 40 MG capsule Take 40 mg by mouth every morning.     Marland Kitchen oxybutynin (DITROPAN) 5 MG tablet Take 1 tablet (5 mg total) by mouth every 8 (  eight) hours as needed for bladder spasms. 90 tablet 2  . oxyCODONE-acetaminophen (PERCOCET) 10-325 MG per tablet Take 1 tablet by mouth every 8 (eight) hours as needed for pain (for back pain).   0  . sildenafil (REVATIO) 20 MG tablet TAKE 2 TO 5 TABLETS BY MOUTH AS NEEDED  11  . tiZANidine (ZANAFLEX) 4 MG tablet Take 4 mg by mouth 2 (two) times daily as needed. Muscle spasms  2  . clonazePAM (KLONOPIN) 2 MG tablet Take 2 mg by mouth every evening. Reported on 08/10/2015    . Magnesium 250 MG TABS Take 250 mg by mouth daily. Reported on 08/10/2015    .  Potassium Gluconate 595 MG CAPS Take 1 capsule by mouth daily. Reported on 08/10/2015    . traZODone (DESYREL) 50 MG tablet Take 50 mg by mouth at bedtime. Reported on 08/10/2015  2   No current facility-administered medications on file prior to visit.     REVIEW OF SYSTEMS: See history of present illness for pertinent positives and negatives. Otherwise all systems was negative  PHYSICAL EXAMINATION:  Filed Vitals:   08/10/15 1003  BP: 122/81  Pulse: 95  Temp: 96.8 F (36 C)  TempSrc: Oral  Resp: 14  Height: 5\' 10"  (1.778 m)  Weight: 250 lb (113.399 kg)  SpO2: 98%   Body mass index is 35.87 kg/(m^2). General: The patient appears their stated age.  HEENT: No gross abnormalities Pulmonary: Respirations are non-labored Musculoskeletal: There are no major deformities.  Neurologic: No focal weakness or paresthesias are detected, Skin: There are no ulcer or rashes noted. Psychiatric: The patient has normal affect. Cardiovascular: There is a regular rate and rhythm without significant murmur appreciated.Nonpalpable left pedal pulse  Diagnostic Studies: I have reviewed his CT angiogram which reveals a left popliteal occlusion. I have also reviewed his arteriogram from a year ago shows popliteal occlusion on the left  He had vein mapping performed today in the office which shows adequate left saphenous vein   Assessment:  Left popliteal occlusion Plan: The etiology of the occlusion is unknown. Potentially this could have been thromboembolic, or it could be a popliteal entrapment. Regardless, he is significantly symptomatic and would like to have this addressed. Because of the location of the occlusion, I think he is going to need an above-knee to below-knee popliteal artery bypass graft. In this setting, I think a posterior approach most likely would not provide adequate exposure. I also discussed the possibility of exploring this from the  above-knee popliteal exposure to see if I can get beyond the stenosis and treating this with a short interposition graft.  We discussed the risks and benefits of the operation including the risk of wound issues, long-term patency rates, cardiopulmonary complications. All his questions were answered. I have this scheduled for Friday, April 28. He will need to be off of his Pletal for at least 1 week prior to his operation.     Eldridge Abrahams, M.D. [facility] Office: [phone] Pager: [phone]      No interval change CV:RRR Pulm: CTA abd soft Plan left leg bypass   [facility] Melinda Pottinger

## 2015-09-11 NOTE — Anesthesia Procedure Notes (Signed)
Procedure Name: Intubation Date/Time: 09/11/2015 7:36 AM Performed by: Clearnce Sorrel Pre-anesthesia Checklist: Patient identified, Timeout performed, Emergency Drugs available, Suction available and Patient being monitored Patient Re-evaluated:Patient Re-evaluated prior to inductionOxygen Delivery Method: Circle system utilized Preoxygenation: Pre-oxygenation with 100% oxygen Intubation Type: IV induction Ventilation: Mask ventilation without difficulty Laryngoscope Size: Glidescope and 4 (limited neck ROM) Grade View: Grade I Tube type: Oral Tube size: 7.5 mm Number of attempts: 1 Airway Equipment and Method: Stylet Placement Confirmation: ETT inserted through vocal cords under direct vision,  breath sounds checked- equal and bilateral and positive ETCO2 Secured at: 23 cm Tube secured with: Tape Dental Injury: Teeth and Oropharynx as per pre-operative assessment

## 2015-09-11 NOTE — Anesthesia Postprocedure Evaluation (Signed)
Anesthesia Post Note  Patient: Jeff [name]  Procedure(s) Performed: Procedure(s) (LRB): BYPASS GRAFT LEFT ABOVE KNEE TO BELOW KNEE POPLITEAL ARTERY WITH LEFT GREATER SAPHENOUS VEIN (Left) LEFT GREATER SAPHENOUS VEIN HARVEST (Left)  Patient location during evaluation: PACU Anesthesia Type: General Level of consciousness: awake and alert Pain management: pain level controlled Vital Signs Assessment: post-procedure vital signs reviewed and stable Respiratory status: spontaneous breathing, nonlabored ventilation, respiratory function stable and patient connected to nasal cannula oxygen Cardiovascular status: blood pressure returned to baseline and stable Postop Assessment: no signs of nausea or vomiting Anesthetic complications: no    Last Vitals:  Filed Vitals:   09/11/15 1305 09/11/15 1320  BP: 114/79 109/71  Pulse: 84 82  Temp:    Resp: 12 7    Last Pain:  Filed Vitals:   09/11/15 1333  PainSc: Asleep                 Tiajuana Amass

## 2015-09-11 NOTE — Op Note (Signed)
Patient name: Jeff [name] MRN: [id number] DOB: October 28, 1965 Sex: male  09/11/2015 Pre-operative Diagnosis: Left leg claudication Post-operative diagnosis:  Same Surgeon:  Annamarie Major Assistants:  Silva Bandy Procedure:   Left above-knee to tibioperoneal trunk bypass graft with ipsilateral non-reversed greater saphenous vein Anesthesia:  Gen. Blood Loss:  See anesthesia record Specimens:  None  Findings:  Excellent vein approximate 5 mm.  No significant atherosclerotic changes within the popliteal artery.  Patient has a early takeoff of the anterior tibial artery above the knee.  Indications:  The patient has been struggling with claudication.  He has undergone an attempted percutaneous revascularization by Dr. Fletcher Anon.  This was unsuccessful.  He now comes in for surgical planning.  Risks and benefits of procedure discussed the patient as detailed in a preoperative visit.  Procedure:  The patient was identified in the holding area and taken to [place] [facility]  The patient was then placed supine on the table. general anesthesia was administered.  The patient was prepped and draped in the usual sterile fashion.  A time out was called and antibiotics were administered.  Ultrasound was used to evaluate the saphenous vein.  There was a large branch and a bifurcated system beginning in the mid thigh.  Proximal to this area the vein was of excellent caliber.  I first made a medial above-knee incision.  Subcutaneous tissue and fascia were divided with cautery.  The popliteal space was entered.  The above-knee popliteal artery was dissected free.  There is minimal calcification.  There was an excellent pulse.  Next this same incision and dissected out the saphenous vein.  This is approximately a 3-1/2 mm vein it was mobilized throughout the length of the incision.  I then made a medial below-knee incision, protecting the vein while opening the wound.  Fascia was then divided in the popliteal space was  entered.  Soleal attachments were taken down from the tibia.  I dissected out the tibioperoneal trunk, as the patient had a early takeoff of the anterior tibial artery, above the knee.  This is approximately a 3 mm vessel which was soft without atherosclerotic changes.  I then went back up and reevaluated the vein.  The patient had an excellent vein prior to the large branch in the mid thigh.  I felt the better to harvest this vein.  Through a separate skip incisions saphenous vein was then harvested.  This was approximately a 5 mm vein.  Side branches were ligated between silk ties.  Next, a tunnel was created between the above and below-knee incisions and an umbilical tape was passed.  The patient was fully heparinized.  The vein was prepared on the back table.  It distended nicely.  It was marked for orientation.  After the heparin circulated the above-knee popliteal artery was occluded with vascular clamps.  A #11 blade was used to make an arteriotomy which was extended longitudinally with Potts scissors.  The vein was then spatulated to fit the size of the arteriotomy.  A running anastomosis was created with 5-0 Prolene.  Once the anastomosis was completed, a Jerelene Redden valvulotome was used to perform lysis of the valves.  Once this was done there was excellent pulsatile flow through the graft.  The graft was brought through the previously created tunnel making sure to maintain proper orientation.  The below knee tibioperoneal trunk was then occluded with vascular clamps.  A #11 blade was used to make an arteriotomy which was extended with  Potts scissors.  The vein was cut to the appropriate length with leg in full extension.  It was spatulated and a end-to-side anastomosis was created with running 6-0 Prolene.  Prior to completion, the profunda flushing maneuvers were performed.  The anastomosis was completed.  The patient had graft dependent signals in the posterior tibial and anterior tibial artery on the case.   I did not see an arteriogram.  50 mg protamine wasn't given.  Once hemostasis was satisfactory, the vein harvest incisions were closed with several layers of 3-0 Vicryl followed by 4-0 Vicryl skin.  The medial above-knee and below-knee incisions were closed by reapproximating the fascia with 2-0 Vicryl, the saphenous tissue with 3-0 Vicryl the skin with 4-0 Vicryl.  Dermabond was applied.  No immediate complications.   Disposition:  To PACU in stable condition.   Theotis Burrow, M.D. [facility] Office: [phone] Pager:  [phone]

## 2015-09-11 NOTE — Discharge Summary (Signed)
[facility] Discharge Summary  Jeff [name] [birth year] 50 y.o. 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 male who presented at the [facility] office with symptoms of left leg claudication. The patient states that he has been having difficulty for approximately 9 years. His leg symptoms were initially attributed to back issues. He was evaluated by Dr. Fletcher Anon 2 years ago and underwent angiography which reveals a focal occlusion within the left popliteal artery. This was attempted to be treated percutaneously but unsuccessful and now the patient comes in for discussions regarding surgical revascularization. He comes in with a CT angiogram as his prior catheterization, which was antegrade, resulted in a large scrotal hematoma. The patient states that he can walk approximately 250 before his leg becomes tight and painful. He denies having any open wounds.  From a cardiac perspective, the patient has a history of an MI but at coronary angiography this was attributed to coronary spasm. He is medically managed for hypercholesterolemia with a statin. He is medically managed for hypertension. He is a nonsmoker  Hospital Course:  The patient was admitted to the hospital and taken to the operating room on 09/11/2015 and underwent: Left above-knee to tibioperoneal trunk bypass graft with ipsilateral non-reversed greater saphenous vein    The patient tolerated the procedure well and was transported to the PACU in stable condition.   POD 1: The patient was doing well post-operatively. His incisions were clean, dry and intact. His left foot was warm and well perfused. He was ambulating adequately with assistance and pain well controlled. He was discharged home on POD 1 in good condition.    CBC    Component Value Date/Time   WBC 8.1 09/07/2015 0919   RBC 5.25 09/07/2015 0919   HGB 14.0 09/07/2015 0919   HCT 44.7 09/07/2015 0919   PLT 190 09/07/2015 0919   MCV 85.1 09/07/2015 0919   MCH 26.7 09/07/2015 0919   MCHC 31.3 09/07/2015 0919   RDW 12.9 09/07/2015 0919   LYMPHSABS 1.5 01/07/2015 0425   MONOABS 1.0 01/07/2015 0425   EOSABS 0.0 01/07/2015 0425   BASOSABS 0.0 01/07/2015 0425    BMET    Component Value Date/Time   NA 138 09/07/2015 0919   K 3.9 09/07/2015 0919   CL 105 09/07/2015 0919   CO2 24 09/07/2015 0919   GLUCOSE 121* 09/07/2015 0919   BUN 8 09/07/2015 0919   CREATININE 1.16 09/07/2015 0919   CREATININE 1.21 06/30/2015 0849   CALCIUM 8.5* 09/07/2015 0919   GFRNONAA >60 09/07/2015 0919   GFRAA >60 09/07/2015 0919     Discharge Instructions:   The patient is discharged to home with extensive instructions on wound care and progressive ambulation.  They are instructed not to drive or perform any heavy lifting until returning to see the physician in his office.  Discharge Instructions    Call MD for:  redness, tenderness, or signs of infection (pain, swelling, bleeding, redness, odor or green/yellow discharge around incision site)    Complete by:  As directed      Call MD for:  severe or increased pain, loss or decreased feeling  in affected limb(s)    Complete by:  As directed      Call MD for:  temperature >100.5    Complete by:  As directed  Discharge wound care:    Complete by:  As directed   Wash wounds daily with soap and water and pat dry. Do not apply any creams or ointments on your incisions.     Driving Restrictions    Complete by:  As directed   No driving for 2 weeks     Increase activity slowly    Complete by:  As directed   Walk with assistance use walker or cane as needed     Lifting restrictions    Complete by:  As directed   No lifting for 2 weeks     Resume previous diet    Complete by:  As directed            Discharge Diagnosis:  PAD I70.92 Left  Popliteal Occlusion I77.9  Secondary Diagnosis: Patient Active Problem List   Diagnosis Date Noted  . Left leg claudication ([place]) 09/11/2015  . Septic shock ([place]) 01/05/2015  . Ureterolithiasis 01/04/2015  . UTI (urinary tract infection) 01/04/2015  . Sepsis ([place]) 01/04/2015  . ED (erectile dysfunction) 12/09/2014  . Hypothyroidism following radioiodine therapy 05/26/2014  . PAD (peripheral artery disease) ([place]) 02/11/2014  . Lumbar facet arthropathy 07/15/2013  . Chest pain, mid sternal 06/12/2012  . Acute non-ST segment elevation myocardial infarction ([place]) 06/11/2012  . Hyperlipidemia 06/11/2012  . 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 Hypertension 03/16/2010   Past Medical History  Diagnosis Date  . GERD (gastroesophageal reflux disease)   . Arthritis   . Chronic back pain     "from the neck to the lower back"   . Hypertension   . Hyperlipidemia   . History of Graves' disease     vs  Multinodular  . History of thyroid storm     Nov 2011  . Right ureteral stone   . Hypothyroidism following radioiodine therapy     RAI in Mar 2012---  followed by dr Loanne Drilling  . History of panic attacks   . History of kidney stones   . History of non-ST elevation myocardial infarction (NSTEMI)     Jan 2014--  no CAD;  per notes probable coronary vasospasm  . History of septic shock     01-07-2015--  ureterolithias/ pyelonephritis  . History of acute pyelonephritis     01-07-2015  . Thrombocytopenia ([place])   . History of hiatal hernia   . PAD (peripheral artery disease) [facility]) cardiologist-  dr Fletcher Anon    a.  ABI (01/2014):  L 0.53; R 1.0 >> referred to PV  . 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 At risk for sleep apnea     STOP-BANG= 6       SENT TO PCP 01-22-2015  . Depression   . Anxiety        Medication List    TAKE these medications        amLODipine 5 MG  tablet  Commonly known as:  NORVASC  Take 5 mg by mouth daily.     aspirin EC 81 MG tablet  Take 81 mg by mouth daily.     atorvastatin 40 MG tablet  Commonly known as:  LIPITOR  Take 40 mg by mouth every evening.     cilostazol 100 MG tablet  Commonly known as:  PLETAL  Take 100 mg by mouth 2 (two) times daily.     diazepam 10 MG tablet  Commonly known as:  VALIUM  Take 1 tablet by mouth at bedtime as needed.     escitalopram 20 MG tablet  Commonly known as:  LEXAPRO  Take 20 mg by mouth every morning.     levothyroxine 112 MCG tablet  Commonly known as:  SYNTHROID, LEVOTHROID  Take 1 tablet (112 mcg total) by mouth daily before breakfast.     metoprolol 50 MG tablet  Commonly known as:  LOPRESSOR  Take 0.5 tablets (25 mg total) by mouth 2 (two) times daily.     nitroGLYCERIN 0.4 MG SL tablet  Commonly known as:  NITROSTAT  Place 1 tablet (0.4 mg total) under the tongue every 5 (five) minutes as needed for chest pain.     omega-3 acid ethyl esters 1 g capsule  Commonly known as:  LOVAZA  Take 1 g by mouth 2 (two) times daily.     omeprazole 40 MG capsule  Commonly known as:  PRILOSEC  Take 40 mg by mouth every morning.     oxybutynin 5 MG tablet  Commonly known as:  DITROPAN  Take 1 tablet (5 mg total) by mouth every 8 (eight) hours as needed for bladder spasms.     oxyCODONE-acetaminophen 10-325 MG tablet  Commonly known as:  PERCOCET  Take 1 tablet by mouth every 6 (six) hours as needed for pain (for back pain).     sildenafil 20 MG tablet  Commonly known as:  REVATIO  TAKE 2 TO 5 TABLETS BY MOUTH AS NEEDED     tamsulosin 0.4 MG Caps capsule  Commonly known as:  FLOMAX  Take 0.4 mg by mouth daily.     tiZANidine 4 MG tablet  Commonly known as:  ZANAFLEX  Take 4 mg by mouth 2 (two) times daily as needed. Muscle spasms        Percocet #30 No Refill  Disposition: Home  Patient's condition: is Good  Follow up: 1. Dr. Trula Slade in 2  weeks   Virgina Jock, PA-C [facility] [phone] 09/11/2015  3:22 PM  - For VQI Registry use --- Instructions: Press F2 to tab through selections.  Delete question if not applicable.   Post-op:  Wound infection: No  Graft infection: No  Transfusion: No   New Arrhythmia: No Ipsilateral amputation: No, [ ]  Minor, [ ]  BKA, [ ]  AKA Discharge patency: [x ] Primary, [ ]  Primary assisted, [ ]  Secondary, [ ]  Occluded Patency judged by: [x ] Dopper only, [ ]  Palpable graft pulse, [ ]  Palpable distal pulse, [ ]  ABI inc. > 0.15, [ ]  Duplex D/C Ambulatory Status: Ambulatory with Assistance  Complications: MI: No, [ ]  Troponin only, [ ]  EKG or Clinical CHF: No Resp failure:No, [ ]  Pneumonia, [ ]  Ventilator Chg in renal function: No, [ ]  Inc. Cr > 0.5, [ ]  Temp. Dialysis, [ ]  Permanent dialysis Stroke: No, [ ]  Minor, [ ]  Major Return to OR: No   Discharge medications: Statin use:  yes ASA use:  yes Plavix use:  no Beta blocker use: yes Coumadin use: no

## 2015-09-12 LAB — BASIC METABOLIC PANEL
Anion gap: 7 (ref 5–15)
BUN: <5 mg/dL — ABNORMAL LOW (ref 6–20)
CO2: 29 mmol/L (ref 22–32)
Calcium: 8.2 mg/dL — ABNORMAL LOW (ref 8.9–10.3)
Chloride: 106 mmol/L (ref 101–111)
Creatinine, Ser: 1.26 mg/dL — ABNORMAL HIGH (ref 0.61–1.24)
GFR calc Af Amer: >60 mL/min (ref >60)
GFR calc non Af Amer: >60 mL/min (ref >60)
Glucose, Bld: 100 mg/dL — ABNORMAL HIGH (ref 65–99)
Potassium: 4 mmol/L (ref 3.5–5.1)
Sodium: 142 mmol/L (ref 135–145)

## 2015-09-12 LAB — CBC
HCT: 40.1 % (ref 39.0–52.0)
Hemoglobin: 12.4 g/dL — ABNORMAL LOW (ref 13.0–17.0)
MCH: 26.9 pg (ref 26.0–34.0)
MCHC: 30.9 g/dL (ref 30.0–36.0)
MCV: 87 fL (ref 78.0–100.0)
Platelets: 173 10*3/uL (ref 150–400)
RBC: 4.61 MIL/uL (ref 4.22–5.81)
RDW: 13.1 % (ref 11.5–15.5)
WBC: 10.2 10*3/uL (ref 4.0–10.5)

## 2015-09-12 NOTE — Progress Notes (Signed)
CM received request for cane.  CM called [facility] DME rep, Merry Proud to please deliver the cane to room so pt can be discharged.  CM notes pt has recommendation for HHPT but not covered by Medicaid and out of pocket expense not acceptable to pt.  No other CM needs were communicated.

## 2015-09-12 NOTE — Progress Notes (Signed)
   Daily Progress Note  Assessment/Planning: POD #1 s/p L AK pop to TPT w/ ips NR GSV   Pt want to go home  Tolerating pain with PO rx  Tolerated PO  Ambulating reportedly with difficulty  Will check on later and if ready, will D/C   Subjective  - 1 Day Post-Op  No complaints  Objective Filed Vitals:   09/12/15 0735 09/12/15 0745 09/12/15 0947 09/12/15 1132  BP:  100/66 110/79 96/57  Pulse:  82 83 79  Temp: 98.2 F (36.8 C)   98.1 F (36.7 C)  TempSrc: Oral   Oral  Resp:  14  12  Height:      Weight:      SpO2:  97%  99%    Intake/Output Summary (Last 24 hours) at 09/12/15 1144 Last data filed at 09/12/15 0900  Gross per 24 hour  Intake 1887.5 ml  Output   2850 ml  Net -962.5 ml    PULM  CTAB CV  RRR GI  soft, NTND VASC  L vein harvest inc c/d/i, L groin inc c/d/i, L calf inc c/d/i, warm foot  Laboratory CBC    Component Value Date/Time   WBC 10.2 09/12/2015 0000   HGB 12.4* 09/12/2015 0000   HCT 40.1 09/12/2015 0000   PLT 173 09/12/2015 0000    BMET    Component Value Date/Time   NA 142 09/12/2015 0000   K 4.0 09/12/2015 0000   CL 106 09/12/2015 0000   CO2 29 09/12/2015 0000   GLUCOSE 100* 09/12/2015 0000   BUN <5* 09/12/2015 0000   CREATININE 1.26* 09/12/2015 0000   CREATININE 1.21 06/30/2015 0849   CALCIUM 8.2* 09/12/2015 0000   GFRNONAA >60 09/12/2015 0000   GFRAA >60 09/12/2015 0000    Adele Barthel, MD [facility] [place]: [phone] Pager: [phone]  09/12/2015, 11:44 AM

## 2015-09-12 NOTE — Evaluation (Signed)
Physical Therapy Evaluation Patient Details Name: Jeff [name] MRN: [id number] DOB: 20-Jul-1965 Today's Date: 09/12/2015   History of Present Illness  Pt is a 50 y/o M s/p Lt above knee pop to tibioperoneal trunk bypass graft.  Pt's PMH includes chronic back pain, grave's disease, ACDF C3-4, posterior cervical fusion/foraminotomy, posterior lumbar fusion L5-S1, L4-S1, Laminectomy L5-S1.    Clinical Impression  Patient is s/p above surgery resulting in functional limitations due to the deficits listed below (see PT Problem List). Mr. Delbuono ambulated 350 ft using cane and completed stair training all w/ supervision level of assist.  Patient will benefit from skilled PT to increase their independence and safety with mobility to allow discharge to the venue listed below.      Follow Up Recommendations Home health PT;Supervision for mobility/OOB    Equipment Recommendations  Cane    Recommendations for Other Services       Precautions / Restrictions Precautions Precautions: Fall Restrictions Weight Bearing Restrictions: No      Mobility  Bed Mobility               General bed mobility comments: Pt sitting in recliner chair upon PT arrival  Transfers Overall transfer level: Needs assistance Equipment used: Straight cane Transfers: Sit to/from Stand Sit to Stand: Supervision         General transfer comment: Supervision for safety, WB preference to Rt LE.  Ambulation/Gait Ambulation/Gait assistance: Supervision Ambulation Distance (Feet): 350 Feet Assistive device: Straight cane Gait Pattern/deviations: Step-to pattern;Step-through pattern;Decreased stride length;Decreased weight shift to left;Antalgic   Gait velocity interpretation: Below normal speed for age/gender General Gait Details: Step to w/ emerging step through gait pattern.  Proper technique using cane.    Stairs Stairs: Yes Stairs assistance: Supervision Stair Management: One rail Left;Step to  pattern;Alternating pattern;Forwards Number of Stairs: 10 General stair comments: Cues for step to pattern as pt attempt step through w/ limp.  Supervision for safety.  Wheelchair Mobility    Modified Rankin (Stroke Patients Only)       Balance Overall balance assessment: Needs assistance Sitting-balance support: No upper extremity supported;Feet supported Sitting balance-Leahy Scale: Good     Standing balance support: During functional activity;No upper extremity supported Standing balance-Leahy Scale: Fair Standing balance comment: Pt able to stand statically w/o UE support but cannot take outside perturbations                             Pertinent Vitals/Pain Pain Assessment: 0-10 Pain Score: 7  Pain Location: Lt LE Pain Descriptors / Indicators: Aching;Tightness Pain Intervention(s): Limited activity within patient's tolerance;Monitored during session;Repositioned    Home Living Family/patient expects to be discharged to:: Private residence Living Arrangements: Spouse/significant other;Other relatives (grandchild) Available Help at Discharge: Family;Available PRN/intermittently (wife works) Type of Home: Apartment Home Access: Stairs to enter Entrance Stairs-Rails: Horticulturist, commercial of Steps: flight Home Layout: One level Home Equipment: None      Prior Function Level of Independence: Independent         Comments: could only walk ~ 200 ft due to pain     Hand Dominance        Extremity/Trunk Assessment   Upper Extremity Assessment: Defer to OT evaluation           Lower Extremity Assessment: LLE deficits/detail   LLE Deficits / Details: limited strength due to pain as expected post op     Communication   Communication: No difficulties  Cognition Arousal/Alertness: Awake/alert Behavior During Therapy: WFL for tasks assessed/performed Overall Cognitive Status: Within Functional Limits for tasks assessed                       General Comments      Exercises General Exercises - Lower Extremity Ankle Circles/Pumps: AROM;Both;10 reps;Seated Long Arc Quad: AROM;Both;10 reps;Seated Other Exercises Other Exercises: Pt encouraged to ambulate 1x/hour at home w/ wife's supervision      Assessment/Plan    PT Assessment Patient needs continued PT services  PT Diagnosis Difficulty walking;Acute pain   PT Problem List Decreased strength;Decreased activity tolerance;Decreased balance;Decreased mobility;Decreased knowledge of use of DME;Decreased safety awareness;Pain  PT Treatment Interventions DME instruction;Gait training;Stair training;Functional mobility training;Therapeutic activities;Therapeutic exercise;Balance training;Patient/family education   PT Goals (Current goals can be found in the Care Plan section) Acute Rehab PT Goals Patient Stated Goal: to go home PT Goal Formulation: With patient/family Time For Goal Achievement: 09/26/15 Potential to Achieve Goals: Good    Frequency Min 3X/week   Barriers to discharge Inaccessible home environment;Decreased caregiver support steps to enter home and intermittent assist at home    Co-evaluation               End of Session Equipment Utilized During Treatment: Gait belt Activity Tolerance: Patient tolerated treatment well Patient left: in chair;with call bell/phone within reach;with family/visitor present Nurse Communication: Mobility status         Time: 1343-1404 PT Time Calculation (min) (ACUTE ONLY): 21 min   Charges:   PT Evaluation $PT Eval Low Complexity: 1 Procedure     PT G Codes:       Collie Siad PT, DPT  Pager: [phone] Phone: [phone] 09/12/2015, 2:32 PM

## 2015-09-12 NOTE — Progress Notes (Signed)
Patient discharge instructions reviewed with patient and his wife, Otilio Saber. Prescription given to patient. Cane received from case management. S/s of infection reviewed with patient and wife, both verbalize understanding of instructions. Incision care reviewed. Appointment information given.  Pt discharged via wheelchair. No acute distress noted. Patient ambulatory with cane. VSS.

## 2015-09-14 ENCOUNTER — Telehealth: Payer: Self-pay | Admitting: Surgery

## 2015-09-14 ENCOUNTER — Encounter (HOSPITAL_COMMUNITY): Payer: Self-pay | Admitting: Surgery

## 2015-09-14 NOTE — Telephone Encounter (Signed)
sched appt 10/02/15 at 8:30. Lm on hm# to inform pt of appt.

## 2015-09-14 NOTE — Telephone Encounter (Signed)
-----   Message from Mena Goes, RN sent at 09/11/2015  1:25 PM EDT ----- Regarding: schedule   ----- Message -----    From: Alvia Grove, PA-C    Sent: 09/11/2015  11:16 AM      To: [facility] Charge Pool  S/p left above to below knee popliteal bypass 4/281/7  F/u with [facility] in two weeks  Thanks Maudie Mercury

## 2015-09-22 ENCOUNTER — Telehealth: Payer: Self-pay | Admitting: *Deleted

## 2015-09-22 ENCOUNTER — Telehealth: Payer: Self-pay | Admitting: Surgery

## 2015-09-22 NOTE — Telephone Encounter (Signed)
[name] second time calling; pt's left leg draining; please call; pt's wife; they are concern. Thanks so much.

## 2015-09-22 NOTE — Telephone Encounter (Signed)
Returned call to [facility] concerning the drainage from surgical incision.  She states the drainage is clear to yellowish with mild blood.  Denies fever or swelling.  The follow up appointment with Dr Trula Slade is 10/02/15 being so, an appointment was scheduled with Vinnie Level Nickel  NP 09/23/15 at 12:15.  Instructions were given for gauze to be placed over this portion of the incision and to elevate this leg as much as possible.  Understanding of these instructions was verbalized.

## 2015-09-23 ENCOUNTER — Encounter: Payer: Self-pay | Admitting: Family

## 2015-09-23 ENCOUNTER — Ambulatory Visit (INDEPENDENT_AMBULATORY_CARE_PROVIDER_SITE_OTHER): Payer: Self-pay | Admitting: Family

## 2015-09-23 VITALS — BP 106/69 | HR 89 | Temp 97.1°F | Resp 20 | Ht 70.0 in | Wt 254.2 lb

## 2015-09-23 DIAGNOSIS — Z4889 Encounter for other specified surgical aftercare: Secondary | ICD-10-CM | POA: Insufficient documentation

## 2015-09-23 DIAGNOSIS — K5903 Drug induced constipation: Secondary | ICD-10-CM | POA: Insufficient documentation

## 2015-09-23 DIAGNOSIS — Z95828 Presence of other vascular implants and grafts: Secondary | ICD-10-CM

## 2015-09-23 DIAGNOSIS — I70212 Atherosclerosis of native arteries of extremities with intermittent claudication, left leg: Secondary | ICD-10-CM

## 2015-09-23 DIAGNOSIS — Z48812 Encounter for surgical aftercare following surgery on the circulatory system: Secondary | ICD-10-CM | POA: Insufficient documentation

## 2015-09-23 NOTE — Patient Instructions (Signed)
Peripheral Vascular Disease Peripheral vascular disease (PVD) is a disease of the blood vessels that are not part of your heart and brain. A simple term for PVD is poor circulation. In most cases, PVD narrows the blood vessels that carry blood from your heart to the rest of your body. This can result in a decreased supply of blood to your arms, legs, and internal organs, like your stomach or kidneys. However, it most often affects a person's lower legs and feet. There are two types of PVD.  Organic PVD. This is the more common type. It is caused by damage to the structure of blood vessels.  Functional PVD. This is caused by conditions that make blood vessels contract and tighten (spasm). Without treatment, PVD tends to get worse over time. PVD can also lead to acute ischemic limb. This is when an arm or limb suddenly has trouble getting enough blood. This is a medical emergency. CAUSES Each type of PVD has many different causes. The most common cause of PVD is buildup of a fatty material (plaque) inside of your arteries (atherosclerosis). Small amounts of plaque can break off from the walls of the blood vessels and become lodged in a smaller artery. This blocks blood flow and can cause acute ischemic limb. Other common causes of PVD include:  Blood clots that form inside of blood vessels.  Injuries to blood vessels.  Diseases that cause inflammation of blood vessels or cause blood vessel spasms.  Health behaviors and health history that increase your risk of developing PVD. RISK FACTORS  You may have a greater risk of PVD if you:  Have a family history of PVD.  Have certain medical conditions, including:  High cholesterol.  Diabetes.  High blood pressure (hypertension).  Coronary heart disease.  Past problems with blood clots.  Past injury, such as burns or a broken bone. These may have damaged blood vessels in your limbs.  Buerger disease. This is caused by inflamed blood  vessels in your hands and feet.  Some forms of arthritis.  Rare birth defects that affect the arteries in your legs.  Use tobacco.  Do not get enough exercise.  Are obese.  Are age 50 or older. SIGNS AND SYMPTOMS  PVD may cause many different symptoms. Your symptoms depend on what part of your body is not getting enough blood. Some common signs and symptoms include:  Cramps in your lower legs. This may be a symptom of poor leg circulation (claudication).  Pain and weakness in your legs while you are physically active that goes away when you rest (intermittent claudication).  Leg pain when at rest.  Leg numbness, tingling, or weakness.  Coldness in a leg or foot, especially when compared with the other leg.  Skin or hair changes. These can include:  Hair loss.  Shiny skin.  Pale or bluish skin.  Thick toenails.  Inability to get or maintain an erection (erectile dysfunction). People with PVD are more prone to developing ulcers and sores on their toes, feet, or legs. These may take longer than normal to heal. DIAGNOSIS Your health care provider may diagnose PVD from your signs and symptoms. The health care provider will also do a physical exam. You may have tests to find out what is causing your PVD and determine its severity. Tests may include:  Blood pressure recordings from your arms and legs and measurements of the strength of your pulses (pulse volume recordings).  Imaging studies using sound waves to take pictures of   the blood flow through your blood vessels (Doppler ultrasound).  Injecting a dye into your blood vessels before having imaging studies using:  X-rays (angiogram or arteriogram).  Computer-generated X-rays (CT angiogram).  A powerful electromagnetic field and a computer (magnetic resonance angiogram or MRA). TREATMENT Treatment for PVD depends on the cause of your condition and the severity of your symptoms. It also depends on your age. Underlying  causes need to be treated and controlled. These include long-lasting (chronic) conditions, such as diabetes, high cholesterol, and high blood pressure. You may need to first try making lifestyle changes and taking medicines. Surgery may be needed if these do not work. Lifestyle changes may include:  Quitting smoking.  Exercising regularly.  Following a low-fat, low-cholesterol diet. Medicines may include:  Blood thinners to prevent blood clots.  Medicines to improve blood flow.  Medicines to improve your blood cholesterol levels. Surgical procedures may include:  A procedure that uses an inflated balloon to open a blocked artery and improve blood flow (angioplasty).  A procedure to put in a tube (stent) to keep a blocked artery open (stent implant).  Surgery to reroute blood flow around a blocked artery (peripheral bypass surgery).  Surgery to remove dead tissue from an infected wound on the affected limb.  Amputation. This is surgical removal of the affected limb. This may be necessary in cases of acute ischemic limb that are not improved through medical or surgical treatments. HOME CARE INSTRUCTIONS  Take medicines only as directed by your health care provider.  Do not use any tobacco products, including cigarettes, chewing tobacco, or electronic cigarettes. If you need help quitting, ask your health care provider.  Lose weight if you are overweight, and maintain a healthy weight as directed by your health care provider.  Eat a diet that is low in fat and cholesterol. If you need help, ask your health care provider.  Exercise regularly. Ask your health care provider to suggest some good activities for you.  Use compression stockings or other mechanical devices as directed by your health care provider.  Take good care of your feet.  Wear comfortable shoes that fit well.  Check your feet often for any cuts or sores. SEEK MEDICAL CARE IF:  You have cramps in your legs  while walking.  You have leg pain when you are at rest.  You have coldness in a leg or foot.  Your skin changes.  You have erectile dysfunction.  You have cuts or sores on your feet that are not healing. SEEK IMMEDIATE MEDICAL CARE IF:  Your arm or leg turns cold and blue.  Your arms or legs become red, warm, swollen, painful, or numb.  You have chest pain or trouble breathing.  You suddenly have weakness in your face, arm, or leg.  You become very confused or lose the ability to speak.  You suddenly have a very bad headache or lose your vision.   This information is not intended to replace advice given to you by your health care provider. Make sure you discuss any questions you have with your health care provider.   Document Released: 06/09/2004 Document Revised: 05/23/2014 Document Reviewed: 10/10/2013 Elsevier Interactive Patient Education 2016 [employer]  

## 2015-09-23 NOTE — Progress Notes (Signed)
    Postoperative Visit   History of Present Illness  Jeff [name] is a 50 y.o. year old male patient of Dr. Trula Slade who returns today with c/o drainage from left leg surgical incision; reported drainage is clear to yellowish with mild blood. Denies fever. The follow up appointment with Dr Trula Slade is 10/02/15. The patient is s/p Left above-knee to tibioperoneal trunk bypass graft with ipsilateral non-reversed greater saphenous vein on 09/11/15 for left leg claudication.   he patient notes resolution of lower extremity symptoms.  The patient is able to complete their activities of daily living.    He does not have DM and has never used tobacco.   For VQI Use Only  PRE-ADM LIVING: Home  AMB STATUS: Ambulatory  Physical Examination  Filed Vitals:   09/23/15 1215  BP: 106/69  Pulse: 89  Temp: 97.1 F (36.2 C)  TempSrc: Oral  Resp: 20  Height: 5\' 10"  (1.778 m)  Weight: 254 lb 3.2 oz (115.304 kg)   Body mass index is 36.47 kg/(m^2).  Left groin incision is well healed with small hematoma which should absorb over time. Left medial thigh incision is healing well, one open area shoots a stream of clear thin liquid when expressed and drains in a stream occasionally. Moderate amount of non pitting edema surrounding left medial thigh incision. Left pedal pulses with strong Doppler signal.   Medical Decision Making  Jeff [name] is a 50 y.o. year old male who presents s/p left above-knee to tibioperoneal trunk bypass graft with ipsilateral non-reversed greater saphenous vein on 09/11/15 for left leg claudication.  Dr. Scot Dock spoke with pt and wife and examined pt. The copious watery drainage from his left medial thigh incision is lymphatic fluid. This drainage is expected to subside with elevation of legs to reduce swelling when pt is not walking, continue to walk every hour while awake. Cleanse incisions daily with mild soap and water, apply thick gauze dressing and abd pad  to absorb drainage. Incisions are healing well with no signs of infection. Left pedal pulses with strong Doppler signals.  Follow up with Dr. Trula Slade as already scheduled on 10/02/15. I advised pt to notify us if he develops any further concerns.   Jeff [name], Jeff Leyden, RN, MSN, FNP-C [facility] Office: [phone]  09/23/2015, 12:16 PM  Clinic MD: Scot Dock

## 2015-09-25 ENCOUNTER — Encounter: Payer: Self-pay | Admitting: Surgery

## 2015-10-02 ENCOUNTER — Ambulatory Visit (INDEPENDENT_AMBULATORY_CARE_PROVIDER_SITE_OTHER): Payer: Medicaid Other | Admitting: Surgery

## 2015-10-02 ENCOUNTER — Encounter: Payer: Self-pay | Admitting: Surgery

## 2015-10-02 VITALS — BP 122/88 | HR 92 | Temp 97.6°F | Resp 16

## 2015-10-02 DIAGNOSIS — I70212 Atherosclerosis of native arteries of extremities with intermittent claudication, left leg: Secondary | ICD-10-CM

## 2015-10-02 NOTE — Progress Notes (Signed)
Patient name: Jeff [name] MRN: [id number] DOB: 26-May-1965 Sex: male  REASON FOR VISIT: post-op  HPI: Jeff [name] is a 50 y.o. male who returns today for follow-up.  On 09/11/2015 he had an above-knee to below knee bypass graft with vein.  The distal anastomosis was to the tibioperoneal trunk, however this was in the normal below knee popliteal location, as he has an early takeoff of his anterior tibial artery.  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  His postoperative course was uncomplicated.  He was seen last week for drainage from the proximal vein harvest site.  He continues to have drainage from this area.  He denies fevers or chills.  Current Outpatient Prescriptions  Medication Sig Dispense Refill  . amLODipine (NORVASC) 5 MG tablet Take 5 mg by mouth daily.    Marland Kitchen aspirin EC 81 MG tablet Take 81 mg by mouth daily.    Marland Kitchen atorvastatin (LIPITOR) 40 MG tablet Take 40 mg by mouth every evening.     . cilostazol (PLETAL) 100 MG tablet Take 100 mg by mouth 2 (two) times daily.    . diazepam (VALIUM) 10 MG tablet Take 1 tablet by mouth at bedtime as needed.  1  . escitalopram (LEXAPRO) 20 MG tablet Take 20 mg by mouth every morning.   5  . levothyroxine (SYNTHROID, LEVOTHROID) 112 MCG tablet Take 1 tablet (112 mcg total) by mouth daily before breakfast. 30 tablet 4  . metoprolol (LOPRESSOR) 50 MG tablet Take 0.5 tablets (25 mg total) by mouth 2 (two) times daily. 180 tablet 3  . nitroGLYCERIN (NITROSTAT) 0.4 MG SL tablet Place 1 tablet (0.4 mg total) under the tongue every 5 (five) minutes as needed for chest pain. 25 tablet 3  . omega-3 acid ethyl esters (LOVAZA) 1 G capsule Take 1 g by mouth 2 (two) times daily.     Marland Kitchen omeprazole (PRILOSEC) 40 MG capsule Take 40 mg by mouth every morning.     Marland Kitchen oxybutynin (DITROPAN) 5 MG tablet Take 1 tablet (5 mg total) by mouth every 8 (eight) hours as needed for  bladder spasms. 90 tablet 2  . oxyCODONE-acetaminophen (PERCOCET) 10-325 MG tablet Take 1 tablet by mouth every 6 (six) hours as needed for pain (for back pain). 30 tablet 0  . sildenafil (REVATIO) 20 MG tablet TAKE 2 TO 5 TABLETS BY MOUTH AS NEEDED  11  . tamsulosin (FLOMAX) 0.4 MG CAPS capsule Take 0.4 mg by mouth daily.    Marland Kitchen tiZANidine (ZANAFLEX) 4 MG tablet Take 4 mg by mouth 2 (two) times daily as needed. Muscle spasms  2   No current facility-administered medications for this visit.    REVIEW OF SYSTEMS:  [X]  denotes positive finding, [ ]  denotes negative finding Cardiac  Comments:  Chest pain or chest pressure:    Shortness of breath upon exertion:    Short of breath when lying flat:    Irregular heart rhythm:    Constitutional    Fever or chills:      PHYSICAL EXAM: Filed Vitals:   10/02/15 0838  BP: 122/88  Pulse: 92  Temp: 97.6 F (36.4 C)  Resp: 16  SpO2: 98%    GENERAL: The patient is a well-nourished male, in no acute distress. The vital signs are documented above. CARDIOVASCULAR: There is a regular rate and rhythm. PULMONARY: There is good air exchange bilaterally without wheezing or rales. The patient  had a lymphocele which I opened.  Approximately 100 cc of lymphatic fluid was removed.  This area was packed with gauze. The patient has some numbness on the medial side of his leg below the distal incision  MEDICAL ISSUES: Status post above-knee to below-knee bypass with vein.  The patient is recovering nicely.  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  I instructed the patient how to pack this and to change it once or twice a day.  I don't think there is any need for antibiotics, as there is no indication of infection.  I have him scheduled for follow-up in one month with a duplex of his bypass and for a wound check.  He will contact me sooner if he has any further issues.  Annamarie Major [facility]: [phone]

## 2015-10-05 ENCOUNTER — Telehealth: Payer: Self-pay | Admitting: Internal Medicine

## 2015-10-05 ENCOUNTER — Ambulatory Visit: Payer: Medicaid Other | Admitting: Internal Medicine

## 2015-10-09 NOTE — Addendum Note (Signed)
Addended by: Reola Calkins on: 10/09/2015 12:39 PM   Modules accepted: Orders

## 2015-10-13 ENCOUNTER — Encounter (HOSPITAL_COMMUNITY): Payer: Self-pay | Admitting: *Deleted

## 2015-10-13 ENCOUNTER — Emergency Department (HOSPITAL_COMMUNITY)
Admission: EM | Admit: 2015-10-13 | Discharge: 2015-10-14 | Disposition: A | Discharge status: Home / Self Care | Payer: Medicaid Other | Attending: Emergency Medicine | Admitting: Emergency Medicine

## 2015-10-13 ENCOUNTER — Emergency Department (HOSPITAL_COMMUNITY): Payer: Medicaid Other

## 2015-10-13 DIAGNOSIS — I252 Old myocardial infarction: Secondary | ICD-10-CM | POA: Insufficient documentation

## 2015-10-13 DIAGNOSIS — Z7982 Long term (current) use of aspirin: Secondary | ICD-10-CM | POA: Diagnosis not present

## 2015-10-13 DIAGNOSIS — M542 Cervicalgia: Secondary | ICD-10-CM | POA: Insufficient documentation

## 2015-10-13 DIAGNOSIS — Y999 Unspecified external cause status: Secondary | ICD-10-CM | POA: Insufficient documentation

## 2015-10-13 DIAGNOSIS — J45909 Unspecified asthma, uncomplicated: Secondary | ICD-10-CM | POA: Insufficient documentation

## 2015-10-13 DIAGNOSIS — I10 Essential (primary) hypertension: Secondary | ICD-10-CM | POA: Diagnosis not present

## 2015-10-13 DIAGNOSIS — Z79899 Other long term (current) drug therapy: Secondary | ICD-10-CM | POA: Insufficient documentation

## 2015-10-13 DIAGNOSIS — Y92410 Unspecified street and highway as the place of occurrence of the external cause: Secondary | ICD-10-CM | POA: Insufficient documentation

## 2015-10-13 DIAGNOSIS — Y939 Activity, unspecified: Secondary | ICD-10-CM | POA: Insufficient documentation
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
## 2015-10-14 ENCOUNTER — Encounter (HOSPITAL_COMMUNITY): Payer: Self-pay | Admitting: Emergency Medicine

## 2015-10-14 MED ORDER — NAPROXEN 500 MG PO TABS
500.0000 mg | ORAL_TABLET | Freq: Once | ORAL | Status: AC
  Administered 2015-10-14: 500 mg via ORAL
  Filled 2015-10-14: qty 1

## 2015-10-14 MED ORDER — NAPROXEN 500 MG PO TABS: 500.0000 mg | ORAL_TABLET | Freq: Two times a day (BID) | ORAL | Status: DC

## 2015-10-14 MED ORDER — METHOCARBAMOL 500 MG PO TABS
500.0000 mg | ORAL_TABLET | Freq: Once | ORAL | Status: AC
  Administered 2015-10-14: 500 mg via ORAL
  Filled 2015-10-14: qty 1

## 2015-10-14 MED ORDER — METHOCARBAMOL 500 MG PO TABS: 500.0000 mg | ORAL_TABLET | Freq: Two times a day (BID) | ORAL | Status: DC

## 2015-10-14 NOTE — Discharge Instructions (Signed)
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
## 2015-10-26 ENCOUNTER — Encounter: Payer: Self-pay | Admitting: Surgery

## 2015-10-27 ENCOUNTER — Ambulatory Visit (HOSPITAL_COMMUNITY)
Admission: RE | Admit: 2015-10-27 | Discharge: 2015-10-27 | Disposition: A | Discharge status: Home / Self Care | Payer: Medicaid Other | Source: Ambulatory Visit | Attending: Surgery | Admitting: Surgery

## 2015-10-27 DIAGNOSIS — F419 Anxiety disorder, unspecified: Secondary | ICD-10-CM | POA: Insufficient documentation

## 2015-10-27 DIAGNOSIS — I10 Essential (primary) hypertension: Secondary | ICD-10-CM | POA: Insufficient documentation

## 2015-10-27 DIAGNOSIS — R0989 Other specified symptoms and signs involving the circulatory and respiratory systems: Secondary | ICD-10-CM | POA: Diagnosis present

## 2015-10-27 DIAGNOSIS — K219 Gastro-esophageal reflux disease without esophagitis: Secondary | ICD-10-CM | POA: Diagnosis not present

## 2015-10-27 DIAGNOSIS — F329 Major depressive disorder, single episode, unspecified: Secondary | ICD-10-CM | POA: Insufficient documentation

## 2015-10-27 DIAGNOSIS — E785 Hyperlipidemia, unspecified: Secondary | ICD-10-CM | POA: Diagnosis not present

## 2015-10-27 DIAGNOSIS — I70212 Atherosclerosis of native arteries of extremities with intermittent claudication, left leg: Secondary | ICD-10-CM | POA: Insufficient documentation

## 2015-11-02 ENCOUNTER — Ambulatory Visit (HOSPITAL_COMMUNITY): Admission: RE | Admit: 2015-11-02 | Payer: No Typology Code available for payment source | Source: Ambulatory Visit

## 2015-11-02 ENCOUNTER — Ambulatory Visit: Payer: Medicaid Other | Admitting: Surgery

## 2015-11-04 ENCOUNTER — Ambulatory Visit (HOSPITAL_COMMUNITY)
Admission: RE | Admit: 2015-11-04 | Discharge: 2015-11-04 | Disposition: A | Discharge status: Home / Self Care | Payer: Medicaid Other | Source: Ambulatory Visit | Attending: Vascular Surgery | Admitting: Vascular Surgery

## 2015-11-04 DIAGNOSIS — I70212 Atherosclerosis of native arteries of extremities with intermittent claudication, left leg: Secondary | ICD-10-CM | POA: Diagnosis present

## 2015-11-04 DIAGNOSIS — E785 Hyperlipidemia, unspecified: Secondary | ICD-10-CM | POA: Diagnosis not present

## 2015-11-04 DIAGNOSIS — I10 Essential (primary) hypertension: Secondary | ICD-10-CM | POA: Insufficient documentation

## 2015-11-04 DIAGNOSIS — Z95828 Presence of other vascular implants and grafts: Secondary | ICD-10-CM | POA: Diagnosis not present

## 2015-11-06 ENCOUNTER — Encounter: Payer: Self-pay | Admitting: Surgery

## 2015-11-13 ENCOUNTER — Other Ambulatory Visit: Payer: Self-pay | Admitting: Cardiovascular Disease

## 2015-11-13 ENCOUNTER — Ambulatory Visit (INDEPENDENT_AMBULATORY_CARE_PROVIDER_SITE_OTHER): Payer: Self-pay | Admitting: Surgery

## 2015-11-13 DIAGNOSIS — I70212 Atherosclerosis of native arteries of extremities with intermittent claudication, left leg: Secondary | ICD-10-CM

## 2015-11-13 NOTE — Telephone Encounter (Signed)
Do not fill.  This was discontinued by Dr. Trula Slade, w/ [facility], today at office visit.

## 2015-11-13 NOTE — Telephone Encounter (Signed)
Please review for refill. Thanks!  

## 2015-11-13 NOTE — Progress Notes (Signed)
Patient name: Jeff [name] MRN: [id number] DOB: Oct 12, 1965 Sex: male  REASON FOR VISIT: follow up  HPI: Jeff [name] is a 50 y.o. male who returns today for follow-up. On 09/11/2015 he had an above-knee to below knee bypass graft with vein. The distal anastomosis was to the tibioperoneal trunk, however this was in the normal below knee popliteal location, as he has an early takeoff of his anterior tibial artery. 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 His postoperative course was uncomplicated.He developed a seroma from the proximal vein harvest site which was drained in the office and has not recurred.  He does complain of some swelling in the left leg.  He is able to walk without pain  Current Outpatient Prescriptions  Medication Sig Dispense Refill  . amLODipine (NORVASC) 5 MG tablet Take 5 mg by mouth daily.    Marland Kitchen aspirin EC 81 MG tablet Take 81 mg by mouth daily.    Marland Kitchen atorvastatin (LIPITOR) 40 MG tablet Take 40 mg by mouth every evening.     . cilostazol (PLETAL) 100 MG tablet Take 100 mg by mouth 2 (two) times daily.    . diazepam (VALIUM) 10 MG tablet Take 1 tablet by mouth at bedtime as needed.  1  . escitalopram (LEXAPRO) 20 MG tablet Take 20 mg by mouth every morning.   5  . levothyroxine (SYNTHROID, LEVOTHROID) 112 MCG tablet Take 1 tablet (112 mcg total) by mouth daily before breakfast. 30 tablet 4  . methocarbamol (ROBAXIN) 500 MG tablet Take 1 tablet (500 mg total) by mouth 2 (two) times daily. 20 tablet 0  . metoprolol (LOPRESSOR) 50 MG tablet Take 0.5 tablets (25 mg total) by mouth 2 (two) times daily. 180 tablet 3  . naproxen (NAPROSYN) 500 MG tablet Take 1 tablet (500 mg total) by mouth 2 (two) times daily. 30 tablet 0  . nitroGLYCERIN (NITROSTAT) 0.4 MG SL tablet Place 1 tablet (0.4 mg total) under the tongue every 5 (five) minutes as needed for  chest pain. 25 tablet 3  . omega-3 acid ethyl esters (LOVAZA) 1 G capsule Take 1 g by mouth 2 (two) times daily.     Marland Kitchen omeprazole (PRILOSEC) 40 MG capsule Take 40 mg by mouth every morning.     Marland Kitchen oxybutynin (DITROPAN) 5 MG tablet Take 1 tablet (5 mg total) by mouth every 8 (eight) hours as needed for bladder spasms. 90 tablet 2  . oxyCODONE-acetaminophen (PERCOCET) 10-325 MG tablet Take 1 tablet by mouth every 6 (six) hours as needed for pain (for back pain). 30 tablet 0  . sildenafil (REVATIO) 20 MG tablet TAKE 2 TO 5 TABLETS BY MOUTH AS NEEDED  11  . tamsulosin (FLOMAX) 0.4 MG CAPS capsule Take 0.4 mg by mouth daily.    Marland Kitchen tiZANidine (ZANAFLEX) 4 MG tablet Take 4 mg by mouth 2 (two) times daily as needed. Muscle spasms  2   No current facility-administered medications for this visit.    REVIEW OF SYSTEMS:  [X]  denotes positive finding, [ ]  denotes negative finding Cardiac  Comments:  Chest pain or chest pressure:    Shortness of breath upon exertion:    Short of breath when lying flat:    Irregular heart rhythm:    Constitutional    Fever or chills:      PHYSICAL EXAM: There were no vitals filed for this visit.  GENERAL: The patient is a well-nourished  male, in no acute distress. The vital signs are documented above. CARDIOVASCULAR: There is a regular rate and rhythm. PULMONARY: There is good air exchange bilaterally without wheezing or rales. 1-2 plus edema to the left leg  I have reviewed his vascular lab studies.  He has triphasic waveforms and normal ABIs.  The bypass graft is widely patent without evidence of stenosis  MEDICAL ISSUES: He continues to do very well following his operation.  I have encouraged him to wear 15-20 mmHg compression stockings to help with the swelling until it resolves.  He will also keep his leg elevated when possible.  Vascular lab studies showed a widely patent bypass graft.  He will have repeat surveillance studies in 6 months.  His cilostazol was  discontinued today  Annamarie Major, MD [facility] [phone] Pager [phone]

## 2015-11-14 DIAGNOSIS — C189 Malignant neoplasm of colon, unspecified: Secondary | ICD-10-CM

## 2015-11-14 HISTORY — DX: Secondary malignant neoplasm of liver and intrahepatic bile duct: C18.9

## 2015-12-04 ENCOUNTER — Ambulatory Visit (INDEPENDENT_AMBULATORY_CARE_PROVIDER_SITE_OTHER): Payer: Medicaid Other | Admitting: Internal Medicine

## 2015-12-04 ENCOUNTER — Encounter: Payer: Self-pay | Admitting: Internal Medicine

## 2015-12-04 VITALS — BP 120/78 | HR 88 | Ht 69.5 in | Wt 258.2 lb

## 2015-12-04 DIAGNOSIS — R1030 Lower abdominal pain, unspecified: Secondary | ICD-10-CM

## 2015-12-04 DIAGNOSIS — R195 Other fecal abnormalities: Secondary | ICD-10-CM

## 2015-12-04 DIAGNOSIS — R194 Change in bowel habit: Secondary | ICD-10-CM

## 2015-12-04 DIAGNOSIS — K921 Melena: Secondary | ICD-10-CM

## 2015-12-04 NOTE — Patient Instructions (Signed)
You have been scheduled for a colonoscopy. Please follow written instructions given to you at your visit today.  Please pick up your prep supplies at the pharmacy within the next 1-3 days. If you use inhalers (even only as needed), please bring them with you on the day of your procedure. Your physician has requested that you go to www.startemmi.com and enter the access code given to you at your visit today. This web site gives a general overview about your procedure. However, you should still follow specific instructions given to you by our office regarding your preparation for the procedure.   I appreciate the opportunity to care for you. Carl Gessner, MD, FACG 

## 2015-12-04 NOTE — Progress Notes (Deleted)
Patient ID: Jeff [name], male   DOB: 1966-05-04, 50 y.o.   MRN: [id number]

## 2015-12-04 NOTE — Progress Notes (Signed)
Referred by  Charlynn Court, NP [place], [place] [address]  Subjective:    Patient ID: Jeff [name], male    DOB: [birth year], 50 y.o.   MRN: [id number]  CC: Constipation and rectal bleeding  HPI  Jeff [name] is a 50 y.o. Male with a pmh of chronic back pain, HTN, PAD, depression, anxiety, hypothyroidism, and Graves' disease, referred to the office for constipation and heme positive stool test. He has been experiencing his constipation since February this year, noting it has hard pencil like stools. He describes a very large and bloody bowel movement then and subsequent persistent changes as described.  During this time he as had bright red blood that fills the toilet bowel with every bowel movement. Assoicate some back pain and suprapubic abdominal cramping right before bowel movements and relieved with defecation.Has noticed bloating and a 7 pound weight gain in the past month. Denies any  nausea, vomiting, diarrhea, loss in appetite, or dysuria.  Allergies  Allergen Reactions  . Vicodin [Hydrocodone-Acetaminophen] Hives   Outpatient Prescriptions Prior to Visit  Medication Sig Dispense Refill  . amLODipine (NORVASC) 5 MG tablet Take 5 mg by mouth daily.    Marland Kitchen aspirin EC 81 MG tablet Take 81 mg by mouth daily.    Marland Kitchen atorvastatin (LIPITOR) 40 MG tablet Take 40 mg by mouth every evening.     . diazepam (VALIUM) 10 MG tablet Take 1 tablet by mouth at bedtime as needed.  1  . escitalopram (LEXAPRO) 20 MG tablet Take 20 mg by mouth every morning.   5  . levothyroxine (SYNTHROID, LEVOTHROID) 112 MCG tablet Take 1 tablet (112 mcg total) by mouth daily before breakfast. 30 tablet 4  . metoprolol (LOPRESSOR) 50 MG tablet Take 0.5 tablets (25 mg total) by mouth 2 (two) times daily. 180 tablet 3  . omega-3 acid ethyl esters (LOVAZA) 1 G capsule Take 1 g by mouth 2 (two) times daily.     Marland Kitchen oxyCODONE-acetaminophen (PERCOCET) 10-325 MG tablet Take 1 tablet by mouth every 6 (six)  hours as needed for pain (for back pain). 30 tablet 0  . tamsulosin (FLOMAX) 0.4 MG CAPS capsule Take 0.4 mg by mouth daily.    Marland Kitchen tiZANidine (ZANAFLEX) 4 MG tablet Take 4 mg by mouth 2 (two) times daily as needed. Muscle spasms  2  . nitroGLYCERIN (NITROSTAT) 0.4 MG SL tablet Place 1 tablet (0.4 mg total) under the tongue every 5 (five) minutes as needed for chest pain. (Patient not taking: Reported on 12/04/2015) 25 tablet 3  . cilostazol (PLETAL) 100 MG tablet Take 100 mg by mouth 2 (two) times daily.    . methocarbamol (ROBAXIN) 500 MG tablet Take 1 tablet (500 mg total) by mouth 2 (two) times daily. 20 tablet 0  . naproxen (NAPROSYN) 500 MG tablet Take 1 tablet (500 mg total) by mouth 2 (two) times daily. 30 tablet 0  . omeprazole (PRILOSEC) 40 MG capsule Take 40 mg by mouth every morning.     Marland Kitchen oxybutynin (DITROPAN) 5 MG tablet Take 1 tablet (5 mg total) by mouth every 8 (eight) hours as needed for bladder spasms. 90 tablet 2  . sildenafil (REVATIO) 20 MG tablet Reported on 12/04/2015  11   No facility-administered medications prior to visit.   Past Medical History  Diagnosis Date  . GERD (gastroesophageal reflux disease)   . Arthritis   . Chronic back pain     "from the neck to the lower back"   .  Hypertension   . Hyperlipidemia   . History of Graves' disease     vs  Multinodular  . History of thyroid storm     Nov 2011  . Right ureteral stone   . Hypothyroidism following radioiodine therapy     RAI in Mar 2012---  followed by dr Loanne Drilling  . History of panic attacks   . History of kidney stones   . History of non-ST elevation myocardial infarction (NSTEMI)     Jan 2014--  no CAD;  per notes probable coronary vasospasm  . History of septic shock     01-07-2015--  ureterolithias/ pyelonephritis  . History of acute pyelonephritis     01-07-2015  . Thrombocytopenia ([place])   . History of hiatal hernia   . PAD (peripheral artery disease) [facility]) cardiologist-  dr Fletcher Anon    a.  ABI  (01/2014):  L 0.53; R 1.0 >> referred to PV  . 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 At risk for sleep apnea     STOP-BANG= 6       SENT TO PCP 01-22-2015  . Depression   . Anxiety   . Dysthymic disorder   . Allergic rhinitis   . ED (erectile dysfunction)   . Sleep disturbance   . Asthma    Past Surgical History  Procedure Laterality Date  . Anterior cervical decomp/discectomy fusion  09-08-2010    C3 -4  . Shoulder arthroscopy Right 03-08-2004    debridement labral tear/  DCR/  acromioplasty  . Posterior cervical fusion/foraminotomy N/A 12/10/2012    Procedure: POSTERIOR CERVICAL FUSION/FORAMINOTOMY LEVEL 1;  Surgeon: Ophelia Charter, MD;  Location: [place] NEURO ORS;  Service: Neurosurgery;  Laterality: N/A;  Cervical three-four posterior cervical fusion with lateral mass screws  . Left heart catheterization with coronary angiogram N/A 06/12/2012    Procedure: LEFT HEART CATHETERIZATION WITH CORONARY ANGIOGRAM;  Surgeon: Josue Hector, MD;  [facility] LAB;  Service: Cardiovascular;  Laterality: N/A;   No sig. CAD/  normal LVF, ef 55-65%  . Abdominal aortagram N/A 02/19/2014    Procedure: ABDOMINAL Maxcine Ham;  Surgeon: Wellington Hampshire, MD;  [facility] CATH LAB;  Service: Cardiovascular;  Laterality: N/A;  . Popliteal artery angioplasty Left 05/28/2014    dr Fletcher Anon    Attempted and unsuccessful due to inability to cross the occlusionnotes   . Umbilical hernia repair  1980  . Posterior lumbar fusion  11-11-2009;   07-15-2013    L5 -- S1;   L4-- S1  . Lower extremity angiogram Left 05/28/2014    Procedure: LOWER EXTREMITY ANGIOGRAM;  Surgeon: Wellington Hampshire, MD;  Location: [place] CATH LAB;  Service: Cardiovascular;  Laterality: Left;  Failed PTA CTO  . Cystoscopy w/ ureteral stent placement Right 01/04/2015    Procedure: CYSTOSCOPY WITH RETROGRADE PYELOGRAM/URETERAL STENT  PLACEMENT;  Surgeon: Franchot Gallo, MD;  Location: [place];  Service: Urology;  Laterality: Right;  . Laminectomy and microdiscectomy lumbar spine  03-26-2009    left L5 -- S1  . Transthoracic echocardiogram  01-05-2015    mild LVH/  ef 60-65%/  mild TR  . Cystoscopy/ureteroscopy/holmium laser/stent placement Right 01/26/2015    Procedure: CYSTOSCOPY/URETEROSCOPY RIGHT;  Surgeon: Franchot Gallo, MD;  Location: [facility];  Service: Urology;  Laterality: Right;  . Cystoscopy w/ ureteral stent removal Right 01/26/2015    Procedure: CYSTOSCOPY WITH  STENT REMOVAL;  Surgeon: Franchot Gallo, MD;  Location: [facility];  Service: Urology;  Laterality: Right;  . Back surgery      x5  . Femoral-popliteal bypass graft Left 09/11/2015    Procedure: BYPASS GRAFT LEFT ABOVE KNEE TO BELOW KNEE POPLITEAL ARTERY WITH LEFT GREATER SAPHENOUS VEIN;  Surgeon: Serafina Mitchell, MD;  Location: [facility];  Service: Vascular;  Laterality: Left;  . Vein harvest Left 09/11/2015    Procedure: LEFT GREATER SAPHENOUS VEIN HARVEST;  Surgeon: Serafina Mitchell, MD;  Location: [facility] OR;  Service: Vascular;  Laterality: Left;   Social History   Social History  . Marital Status: Married    Spouse Name: N/A  . Number of Children: 5  . Years of Education: N/A   Occupational History  . Disabled    Social History Main Topics  . Smoking status: Never Smoker   . Smokeless tobacco: Never Used  . Alcohol Use: 0.0 oz/week    0 Standard drinks or equivalent per week     Comment: RARE  . Drug Use: No  . Sexual Activity: Not Asked   Other Topics Concern  . None   Social History Narrative   Married - wife works Haematologist @ [employer]   5 daughters   Regular exercise-no   Disabled to due back problem   12/04/2015      Family History  Problem Relation Age of Onset  . Thyroid disease Mother     hypothyroidism  . Heart attack Maternal Grandfather   . Heart Problems Father     pacermaker    . Edema Father   . Heart disease Maternal Grandmother   . Lung cancer Maternal Grandmother   . Diabetes Maternal Grandmother   . Hypertension Maternal Grandmother      Review of Systems See HPI, all other systems are negative    Objective:   Physical Exam @BP  120/78 mmHg  Pulse 88  Ht 5' 9.5" (1.765 m)  Wt 258 lb 4 oz (117.141 kg)  BMI 37.60 kg/m2@  General:  Well-developed, well-nourished and in no acute distress Eyes:  anicteric. ENT:   Mouth and posterior pharynx free of lesions.  Neck:   supple w/o thyromegaly or mass.  Lungs: Clear to auscultation bilaterally. Heart:  S1S2, no rubs, murmurs, gallops. Abdomen:  soft, mildly tender diffusely, no hepatosplenomegaly, hernia, or mass and BS+.  Rectal: Deferred  Lymph:  no cervical or supraclavicular adenopathy. Extremities:   no edema, cyanosis or clubbing Skin   no rash. Neuro:  A&O x 3.  Psych:  appropriate mood and  Affect.   Data Reviewed: Labs from 09/12/15 and recent PCP note Lab Results  Component Value Date   WBC 10.2 09/12/2015   HGB 12.4* 09/12/2015   HCT 40.1 09/12/2015   MCV 87.0 09/12/2015   PLT 173 09/12/2015         Assessment & Plan:   Encounter Diagnoses  Name Primary?  . Blood in stool Yes  . Change in stool caliber   . Lower abdominal pain    Based on the his lower abdominal pain, change in stool caliber, profuse bleeding, and positive heme occult test, we will schedule a colonoscopy to evaluate for a source.   Grayland Ormond PA-S  I have seen the patient simultaneously with Mr Venetia Maxon and he has served as a Education administrator.  I have also explained to the patient that I am concerned about this constellation of signs and sxs possibly representing  a colorectal cancer. He is also at risk for ischemic problems involving the intestine.  The risks and benefits as well as alternatives of endoscopic procedure(s) have been discussed and reviewed. All questions answered. The patient agrees to  proceed.  I appreciate the opportunity to care for this patient.  Cc;Gunter, Evelena Leyden, NP

## 2015-12-08 ENCOUNTER — Other Ambulatory Visit (INDEPENDENT_AMBULATORY_CARE_PROVIDER_SITE_OTHER): Payer: Medicaid Other

## 2015-12-08 ENCOUNTER — Encounter: Payer: Self-pay | Admitting: Internal Medicine

## 2015-12-08 ENCOUNTER — Ambulatory Visit (AMBULATORY_SURGERY_CENTER): Payer: Medicaid Other | Admitting: Internal Medicine

## 2015-12-08 VITALS — BP 131/83 | HR 69 | Temp 98.6°F | Resp 14 | Ht 69.0 in | Wt 258.0 lb

## 2015-12-08 DIAGNOSIS — K921 Melena: Secondary | ICD-10-CM

## 2015-12-08 DIAGNOSIS — C187 Malignant neoplasm of sigmoid colon: Secondary | ICD-10-CM

## 2015-12-08 LAB — COMPREHENSIVE METABOLIC PANEL
ALT: 37 U/L (ref 0–53)
AST: 36 U/L (ref 0–37)
Albumin: 3.8 g/dL (ref 3.5–5.2)
Alkaline Phosphatase: 59 U/L (ref 39–117)
BUN: 7 mg/dL (ref 6–23)
CO2: 26 mEq/L (ref 19–32)
Calcium: 8.7 mg/dL (ref 8.4–10.5)
Chloride: 105 mEq/L (ref 96–112)
Creatinine, Ser: 1.01 mg/dL (ref 0.40–1.50)
GFR: 100.98 mL/min (ref >60.00)
Glucose, Bld: 75 mg/dL (ref 70–99)
Potassium: 3.9 mEq/L (ref 3.5–5.1)
Sodium: 137 mEq/L (ref 135–145)
Total Bilirubin: 0.5 mg/dL (ref 0.2–1.2)
Total Protein: 6.8 g/dL (ref 6.0–8.3)

## 2015-12-08 LAB — CBC WITH DIFFERENTIAL/PLATELET
Basophils Absolute: 0 10*3/uL (ref 0.0–0.1)
Basophils Relative: 0.5 % (ref 0.0–3.0)
Eosinophils Absolute: 0.3 10*3/uL (ref 0.0–0.7)
Eosinophils Relative: 3.6 % (ref 0.0–5.0)
HCT: 38.3 % — ABNORMAL LOW (ref 39.0–52.0)
Hemoglobin: 12.4 g/dL — ABNORMAL LOW (ref 13.0–17.0)
Lymphocytes Relative: 39.1 % (ref 12.0–46.0)
Lymphs Abs: 2.8 10*3/uL (ref 0.7–4.0)
MCHC: 32.5 g/dL (ref 30.0–36.0)
MCV: 78.2 fl (ref 78.0–100.0)
Monocytes Absolute: 0.7 10*3/uL (ref 0.1–1.0)
Monocytes Relative: 9 % (ref 3.0–12.0)
Neutro Abs: 3.5 10*3/uL (ref 1.4–7.7)
Neutrophils Relative %: 47.8 % (ref 43.0–77.0)
Platelets: 189 10*3/uL (ref 150.0–400.0)
RBC: 4.9 Mil/uL (ref 4.22–5.81)
RDW: 14.6 % (ref 11.5–15.5)
WBC: 7.2 10*3/uL (ref 4.0–10.5)

## 2015-12-08 NOTE — Progress Notes (Signed)
Called to room to assist during endoscopic procedure.  Patient ID and intended procedure confirmed with present staff. Received instructions for my participation in the procedure from the performing physician.  Pathology sent [facility] per MD order

## 2015-12-08 NOTE — Patient Instructions (Addendum)
Unfortunately there is a mass in the colon that needs to be removed by surgery. It looks like a cancer. We need to wait for the biopsy results and also get CT scans and labs and I will call you with results and plans. At this point we will go ahead and work on an appointment with a Psychologist, sport and exercise.  I appreciate the opportunity to care for you. Gatha Mayer, MD, FACG   YOU HAD AN ENDOSCOPIC PROCEDURE TODAY AT [facility]:   Refer to the procedure report that was given to you for any specific questions about what was found during the examination.  If the procedure report does not answer your questions, please call your gastroenterologist to clarify.  If you requested that your care partner not be given the details of your procedure findings, then the procedure report has been included in a sealed envelope for you to review at your convenience later.  YOU SHOULD EXPECT: Some feelings of bloating in the abdomen. Passage of more gas than usual.  Walking can help get rid of the air that was put into your GI tract during the procedure and reduce the bloating. If you had a lower endoscopy (such as a colonoscopy or flexible sigmoidoscopy) you may notice spotting of blood in your stool or on the toilet paper. If you underwent a bowel prep for your procedure, you may not have a normal bowel movement for a few days.  Please Note:  You might notice some irritation and congestion in your nose or some drainage.  This is from the oxygen used during your procedure.  There is no need for concern and it should clear up in a day or so.  SYMPTOMS TO REPORT IMMEDIATELY:   Following lower endoscopy (colonoscopy or flexible sigmoidoscopy):  Excessive amounts of blood in the stool  Significant tenderness or worsening of abdominal pains  Swelling of the abdomen that is new, acute  Fever of 100F or higher    For urgent or emergent issues, a gastroenterologist can be reached at any hour by  calling [phone].   DIET: Your first meal following the procedure should be a small meal and then it is ok to progress to your normal diet. Heavy or fried foods are harder to digest and may make you feel nauseous or bloated.  Likewise, meals heavy in dairy and vegetables can increase bloating.  Drink plenty of fluids but you should avoid alcoholic beverages for 24 hours.  ACTIVITY:  You should plan to take it easy for the rest of today and you should NOT DRIVE or use heavy machinery until tomorrow (because of the sedation medicines used during the test).    FOLLOW UP: Our staff will call the number listed on your records the next business day following your procedure to check on you and address any questions or concerns that you may have regarding the information given to you following your procedure. If we do not reach you, we will leave a message.  However, if you are feeling well and you are not experiencing any problems, there is no need to return our call.  We will assume that you have returned to your regular daily activities without incident.  If any biopsies were taken you will be contacted by phone or by letter within the next 1-3 weeks.  Please call us at [phone] if you have not heard about the biopsies in 3 weeks.    SIGNATURES/CONFIDENTIALITY: You and/or your care  partner have signed paperwork which will be entered into your electronic medical record.  These signatures attest to the fact that that the information above on your After Visit Summary has been reviewed and is understood.  Full responsibility of the confidentiality of this discharge information lies with you and/or your care-partner.   Lab work will be done today  CT of abdomen,chest,&pelvis to be ordered by Dr Celesta Aver office  Surgery consult will be made by Dr Celesta Aver office  Contrast and ct instruction sheet given to you to fill out when dr Celesta Aver office calls you with details

## 2015-12-08 NOTE — Op Note (Signed)
[place] Patient Name: Jeff [name] Procedure Date: 12/08/2015 2:57 PM MRN: [id number] Endoscopist: Gatha Mayer , MD Age: 50 Referring MD:  Date of Birth: 1965-09-24 Gender: Male Account #: [id number] Procedure:                Colonoscopy Indications:              Evaluation of unexplained GI bleeding Medicines:                Monitored Anesthesia Care Procedure:                Pre-Anesthesia Assessment:                           - Prior to the procedure, a History and Physical                            was performed, and patient medications and                            allergies were reviewed. The patient's tolerance of                            previous anesthesia was also reviewed. The risks                            and benefits of the procedure and the sedation                            options and risks were discussed with the patient.                            All questions were answered, and informed consent                            was obtained. Prior Anticoagulants: The patient has                            taken no previous anticoagulant or antiplatelet                            agents. ASA Grade Assessment: III - A patient with                            severe systemic disease. After reviewing the risks                            and benefits, the patient was deemed in                            satisfactory condition to undergo the procedure.                           After obtaining informed consent, the colonoscope  was passed under direct vision. Throughout the                            procedure, the patient's blood pressure, pulse, and                            oxygen saturations were monitored continuously. The                            Model CF-HQ190L [id number]) scope was introduced                            through the anus and advanced to the the sigmoid                            colon. The rectum was  photographed. The quality of                            the bowel preparation was good. The bowel                            preparation used was Miralax. Scope In: 3:07:22 PM Scope Out: 3:16:39 PM Total Procedure Duration: 0 hours 9 minutes 17 seconds  Findings:                 The perianal and digital rectal examinations were                            normal. Pertinent negatives include normal prostate                            (size, shape, and consistency).                           An infiltrative, polypoid and sessile partially                            obstructing large mass was found in the sigmoid                            colon. The mass was circumferential. No bleeding                            was present. This was biopsied with a cold forceps                            for histology. Verification of patient                            identification for the specimen was done. Estimated                            blood loss was minimal. Area was tattooed with an  injection of 4 mL of Spot (carbon black). Estimated                            blood loss was minimal.                           The exam was otherwise without abnormality on                            direct and retroflexion views. Complications:            No immediate complications. Estimated Blood Loss:     Estimated blood loss was minimal. 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 Biopsied. Tattooed at distal aspect                            (NL tissue). Approx 25 cm from rectum.                           - The examination was otherwise normal on direct                            and retroflexion views. Recommendation:           - Patient has a contact number available for                            emergencies. The signs and symptoms of potential                            delayed complications were discussed  with the                            patient. Return to normal activities tomorrow.                            Written discharge instructions were provided to the                            patient.                           - Resume previous diet.                           - Continue present medications.                           - Await pathology results.                           - Repeat colonoscopy is recommended. The  colonoscopy date will be determined after pathology                            results from today's exam become available for                            review.                           - Needs CBC, CMET and CEA today and will order CT                            chest, abd, pelvis and surgery consult Gatha Mayer, MD 12/08/2015 3:32:06 PM This report has been signed electronically.

## 2015-12-09 ENCOUNTER — Telehealth: Payer: Self-pay | Admitting: *Deleted

## 2015-12-09 ENCOUNTER — Telehealth: Payer: Self-pay

## 2015-12-09 DIAGNOSIS — K6389 Other specified diseases of intestine: Secondary | ICD-10-CM

## 2015-12-09 LAB — CEA: CEA: 321.9 ng/mL — ABNORMAL HIGH

## 2015-12-09 NOTE — Telephone Encounter (Signed)
Patient's wife Laverne notified of the appt details.  She verbalized understanding of appt details.

## 2015-12-09 NOTE — Telephone Encounter (Signed)
Message left

## 2015-12-09 NOTE — Telephone Encounter (Signed)
Per procedure report patient needs to come for CT scan chest, abdomen, and pelvis and surgical consult for colon mass  Patient is scheduled for CT scan for 12/11/15 2:45 arrival for 3:00 appt.  Surgical referral is scheduled for Dr. [name] at [facility] for 12/15/15 9:10 arrival for 9:40 appt.  Left message for patient to call back

## 2015-12-11 ENCOUNTER — Ambulatory Visit (INDEPENDENT_AMBULATORY_CARE_PROVIDER_SITE_OTHER)
Admission: RE | Admit: 2015-12-11 | Discharge: 2015-12-11 | Disposition: A | Discharge status: Home / Self Care | Payer: Medicaid Other | Source: Ambulatory Visit | Attending: Internal Medicine | Admitting: Internal Medicine

## 2015-12-11 ENCOUNTER — Other Ambulatory Visit: Payer: Self-pay | Admitting: Cardiovascular Disease

## 2015-12-11 DIAGNOSIS — K6389 Other specified diseases of intestine: Secondary | ICD-10-CM

## 2015-12-11 DIAGNOSIS — I10 Essential (primary) hypertension: Secondary | ICD-10-CM

## 2015-12-11 MED ORDER — IOPAMIDOL (ISOVUE-300) INJECTION 61%
100.0000 mL | Freq: Once | INTRAVENOUS | Status: AC | PRN
  Administered 2015-12-11: 100 mL via INTRAVENOUS

## 2015-12-13 NOTE — Progress Notes (Signed)
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 [name] says

## 2015-12-14 ENCOUNTER — Telehealth: Payer: Self-pay | Admitting: *Deleted

## 2015-12-14 ENCOUNTER — Other Ambulatory Visit: Payer: Self-pay

## 2015-12-14 ENCOUNTER — Other Ambulatory Visit: Payer: Self-pay | Admitting: Endocrinology

## 2015-12-14 DIAGNOSIS — C187 Malignant neoplasm of sigmoid colon: Secondary | ICD-10-CM

## 2015-12-14 DIAGNOSIS — C787 Secondary malignant neoplasm of liver and intrahepatic bile duct: Secondary | ICD-10-CM

## 2015-12-14 NOTE — Progress Notes (Signed)
Correction there are liver metastases  Will need medical oncology appointment Dr. Burr Medico or Benay Spice also

## 2015-12-14 NOTE — Telephone Encounter (Signed)
Oncology Nurse Navigator Documentation  Oncology Nurse Navigator Flowsheets 12/14/2015  Navigator Location [facility]  Navigator Encounter Type Introductory phone call  Abnormal Finding Date 12/08/2015  Confirmed Diagnosis Date 12/08/2015  Spoke with wife, Otilio Saber and provided new patient appointment for [facility] on 12/18/15 at 10:45 with Dr. Benay Spice . Informed of location of [place], valet service, and registration process and flow of the clinic. Reminded to bring photo ID, insurance cards and a current medication list, including supplements. Wife verbalizes understanding. Informed wife that his appointment may be changed to 10:00 depending on another patient. Will notify her by Wednesday if it changes to 10:00. She is OK with either time. Notified HIM to add appointment to [facility].

## 2015-12-15 NOTE — Progress Notes (Signed)
Patient aware no letter 1 year colonoscopy recall

## 2015-12-16 ENCOUNTER — Other Ambulatory Visit: Payer: Self-pay | Admitting: General Surgery

## 2015-12-16 NOTE — H&P (Signed)
History of Present Illness Jeff Ruff MD; [id number] 10:11 AM) The patient is a 50 year old male who presents with colorectal cancer. 50 year old male who presents to the office for evaluation of a newly diagnosed colon cancer. He states that he has had blood with bowel movements for approximately 6 months. He delayed his colonoscopy 3 months due to having surgery on his leg. He underwent a tibioperoneal trunk bypass graft for left leg claudication. The patient states he has constipation at baseline. He was able to complete his bowel prep without difficulty. He continues to have hard stool but has not noticed much blood. He does have some abdominal cramping prior to bowel movements. He does not take any stool softeners at this time. He has a history of a non-ST segment elevation MI in 2016, he takes a baby aspirin for this. He also has hypothyroidism following radioiodine therapy for multinodular goiter. The patient denies any weight loss.   Other Problems Jeff [name], CMA; 12/15/2015 9:33 AM) Back Pain Colon Cancer Gastroesophageal Reflux Disease High blood pressure Hypercholesterolemia Kidney Stone Myocardial infarction Thyroid Disease  Past Surgical History ([place]; 12/15/2015 9:33 AM) Shoulder Surgery Right. Spinal Surgery - Lower Back Spinal Surgery - Neck  Diagnostic Studies History Jeff [name], CMA; 12/15/2015 9:33 AM) Colonoscopy within last year  Allergies Jeff [name], CMA; 12/15/2015 9:34 AM) Vicodin *ANALGESICS - OPIOID*  Medication History (Jeff [name], CMA; 12/15/2015 9:38 AM) AmLODIPine Besylate (5MG  Tablet, Oral) Active. Atorvastatin Calcium (40MG  Tablet, Oral) Active. DiazePAM (10MG  Tablet, Oral) Active. Pantoprazole Sodium (40MG  Tablet DR, Oral) Active. Tamsulosin HCl (0.4MG  Capsule, Oral) Active. Omeprazole (40MG  Capsule DR, Oral) Active. Levothyroxine Sodium (112MCG Tablet, Oral) Active. Escitalopram Oxalate (20MG  Tablet,  Oral) Active. TiZANidine HCl (4MG  Tablet, Oral) Active. Oxycodone-Acetaminophen (10-325MG  Tablet, Oral as needed) Active. Sildenafil Citrate (20MG  Tablet, Oral) Active. Metoprolol Succinate ER (25MG  Tablet ER 24HR, Oral two times daily) Active. Aspirin (81MG  Tablet Chewable, Oral) Active. Valium (10MG  Tablet, Oral) Active. Medications Reconciled  Social History Jeff [name], CMA; 12/15/2015 9:33 AM) Alcohol use Occasional alcohol use. Caffeine use Coffee, Tea. No drug use Tobacco use Never smoker.  Family History Jeff [name], CMA; 12/15/2015 9:33 AM) Cerebrovascular Accident Mother. Hypertension Mother.    Review of Systems Jeff [name] CMA; 12/15/2015 9:33 AM) General Not Present- Appetite Loss, Chills, Fatigue, Fever, Night Sweats, Weight Gain and Weight Loss. Skin Not Present- Change in Wart/Mole, Dryness, Hives, Jaundice, New Lesions, Non-Healing Wounds, Rash and Ulcer. HEENT Not Present- Earache, Hearing Loss, Hoarseness, Nose Bleed, Oral Ulcers, Ringing in the Ears, Seasonal Allergies, Sinus Pain, Sore Throat, Visual Disturbances, Wears glasses/contact lenses and Yellow Eyes. Respiratory Not Present- Bloody sputum, Chronic Cough, Difficulty Breathing, Snoring and Wheezing. Breast Not Present- Breast Mass, Breast Pain, Nipple Discharge and Skin Changes. Cardiovascular Not Present- Chest Pain, Difficulty Breathing Lying Down, Leg Cramps, Palpitations, Rapid Heart Rate, Shortness of Breath and Swelling of Extremities. Gastrointestinal Present- Abdominal Pain, Bloody Stool, Change in Bowel Habits and Constipation. Not Present- Bloating, Chronic diarrhea, Difficulty Swallowing, Excessive gas, Gets full quickly at meals, Hemorrhoids, Indigestion, Nausea, Rectal Pain and Vomiting. Male Genitourinary Not Present- Blood in Urine, Change in Urinary Stream, Frequency, Impotence, Nocturia, Painful Urination, Urgency and Urine Leakage. Musculoskeletal Present- Back Pain. Not Present-  Joint Pain, Joint Stiffness, Muscle Pain, Muscle Weakness and Swelling of Extremities. Neurological Not Present- Decreased Memory, Fainting, Headaches, Numbness, Seizures, Tingling, Tremor, Trouble walking and Weakness. Psychiatric Not Present- Anxiety, Bipolar, Change in Sleep Pattern, Depression, Fearful and Frequent crying. Endocrine Not Present- Cold  Intolerance, Excessive Hunger, Hair Changes, Heat Intolerance, Hot flashes and New Diabetes. Hematology Not Present- Blood Thinners, Easy Bruising, Excessive bleeding, Gland problems, HIV and Persistent Infections.  Vitals (Jeff [name] CMA; 12/15/2015 9:34 AM) 12/15/2015 9:34 AM Weight: 256 lb Height: 70in Body Surface Area: 2.32 m Body Mass Index: 36.73 kg/m  Temp.: 14F(Temporal)  Pulse: 79 (Regular)  BP: 128/82 (Sitting, Left Arm, Standard)       Physical Exam Jeff Ruff MD; [id number] 10:12 AM) General Mental Status-Alert. General Appearance-Not in acute distress. Build & Nutrition-Well nourished. Posture-Normal posture. Gait-Normal.  Head and Neck Head-normocephalic, atraumatic with no lesions or palpable masses. Trachea-midline.  Chest and Lung Exam Chest and lung exam reveals -on auscultation, normal breath sounds, no adventitious sounds and normal vocal resonance.  Cardiovascular Cardiovascular examination reveals -normal heart sounds, regular rate and rhythm with no murmurs.  Abdomen Inspection Inspection of the abdomen reveals - No Hernias. Note: Umbilical scar noted, previous hernia repair as a child. Palpation/Percussion Palpation and Percussion of the abdomen reveal - Soft, Non Tender, No Rigidity (guarding), No hepatosplenomegaly and No Palpable abdominal masses.  Neurologic Neurologic evaluation reveals -alert and oriented x 3 with no impairment of recent or remote memory, normal attention span and ability to concentrate, normal sensation and normal  coordination.  Musculoskeletal Normal Exam - Bilateral-Upper Extremity Strength Normal and Lower Extremity Strength Normal.    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 CT scan concerning for liver metastases and also has some small lung nodules. CEA significantly elevated at greater than 300. I have discussed his case with his oncologist and his gastroenterologist.  It is felt that he warrants up front surgery.  We will plan on performing a liver biopsy at the beginning of the case and placing a port for chemotherapy as well.  We will have him use MiraLAX on a daily basis to see if we can resolve his obstructive symptoms medically. He will follow-up with Dr. Ammie Dalton on Friday. The surgery and anatomy were described to the patient as well as the risks of surgery and the possible complications.  These include: Bleeding, deep abdominal infections and possible wound complications such as hernia and infection, damage to adjacent structures, leak of surgical connections, which can lead to other surgeries and possibly an ostomy, possible need for other procedures, such as abscess drains in radiology, possible prolonged hospital stay, possible diarrhea from removal of part of the colon, possible constipation from narcotics, prolonged fatigue/weakness or appetite loss, possible early recurrence of of disease, possible complications of their medical problems such as heart disease or arrhythmias or lung problems, death (less than 1%). I believe the patient understands and wishes to proceed with the surgery. Risks of bleeding from liver biopsy and bleeding, device malfunction and pneumothorax from port placement were discussed with the patient as well.

## 2015-12-18 ENCOUNTER — Ambulatory Visit (HOSPITAL_BASED_OUTPATIENT_CLINIC_OR_DEPARTMENT_OTHER): Payer: Medicaid Other | Admitting: Oncology

## 2015-12-18 ENCOUNTER — Telehealth: Payer: Self-pay | Admitting: *Deleted

## 2015-12-18 ENCOUNTER — Telehealth: Payer: Self-pay | Admitting: Oncology

## 2015-12-18 ENCOUNTER — Encounter: Payer: Medicaid Other | Admitting: Nutrition

## 2015-12-18 ENCOUNTER — Encounter: Payer: Self-pay | Admitting: *Deleted

## 2015-12-18 ENCOUNTER — Ambulatory Visit: Payer: Medicaid Other | Attending: Oncology | Admitting: Physical Therapy

## 2015-12-18 DIAGNOSIS — R911 Solitary pulmonary nodule: Secondary | ICD-10-CM | POA: Diagnosis not present

## 2015-12-18 DIAGNOSIS — Z801 Family history of malignant neoplasm of trachea, bronchus and lung: Secondary | ICD-10-CM

## 2015-12-18 DIAGNOSIS — K625 Hemorrhage of anus and rectum: Secondary | ICD-10-CM

## 2015-12-18 DIAGNOSIS — C187 Malignant neoplasm of sigmoid colon: Secondary | ICD-10-CM | POA: Diagnosis not present

## 2015-12-18 DIAGNOSIS — C787 Secondary malignant neoplasm of liver and intrahepatic bile duct: Secondary | ICD-10-CM | POA: Diagnosis not present

## 2015-12-18 DIAGNOSIS — F418 Other specified anxiety disorders: Secondary | ICD-10-CM

## 2015-12-18 DIAGNOSIS — I10 Essential (primary) hypertension: Secondary | ICD-10-CM

## 2015-12-18 DIAGNOSIS — M6281 Muscle weakness (generalized): Secondary | ICD-10-CM | POA: Insufficient documentation

## 2015-12-18 DIAGNOSIS — K5900 Constipation, unspecified: Secondary | ICD-10-CM

## 2015-12-18 NOTE — Progress Notes (Signed)
Oncology Nurse Navigator Documentation  Oncology Nurse Navigator Flowsheets 12/18/2015  Navigator Location [facility]Med On[facility]  Navigator Encounter Type Clinic/MDC  Abnormal Finding Date -  Confirmed Diagnosis Date -  Patient Visit Type MedOnc;Initial  Treatment Phase Pre-Tx/Tx Discussion  Barriers/Navigation Needs Education;Coordination of Care  Education Accessing Care/ Finding Providers;Understanding Cancer/ Treatment Options;Coping with Diagnosis/ Prognosis;Newly Diagnosed Cancer Education;Preparing for Upcoming Surgery/ Treatment  Interventions Coordination of Care  Coordination of Care Other--call to [place] office requesting surgery sooner that 8/25 -possibly another surgeon if available sooner. Dr. Benay Spice will contact Dr. Lucia Gaskins  Support Groups/Services GI Support Group;[place] and to be called;Other--[place] Support services  Acuity Level 2  Time Spent with Patient 60  Met with patient and wife,Laverne during new patient visit. Explained the role of the GI Nurse Navigator and provided New Patient Packet with information on: 1.  Colon cancer--discussed CEA level, anatomy of colon and PAC 2. Support groups 3. Advanced Directives 4. Fall Safety Plan Answered questions, reviewed current treatment plan using TEACH back and provided emotional support. Provided copy of current treatment plan. Hodges and his wife are anxious to get surgery done quickly so he can start chemotherapy. Made him aware navigator will adjust his follow up appointment if necessary based on surgery date. Will need to start treatment soon after surgery. He reports to RN that he does not anticipate any transportation issues related to his chemo at this time. Will arrange for dietician to see him postop.  Merceda Elks, RN, BSN GI Oncology [place]

## 2015-12-18 NOTE — Therapy (Signed)
[facility] [address], [address] [place], [place], [address] Phone: [phone]   Fax:  [phone]  Physical Therapy Evaluation  Patient Details  Name: Jeff [name] MRN: [id number] Date of Birth: 1966-05-10 Referring Provider: Dr. Thornton Papas  Encounter Date: 12/18/2015      PT End of Session - 12/18/15 1243    Visit Number 1   PT Start Time 1214   PT Stop Time 1234   PT Time Calculation (min) 20 min   Activity Tolerance Patient tolerated treatment well   Behavior During Therapy [facility] for tasks assessed/performed      Past Medical History:  Diagnosis Date  . Allergic rhinitis   . Anxiety   . Arthritis   . Asthma   . At risk for sleep apnea    STOP-BANG= 6       SENT TO PCP 01-22-2015  . Chronic back pain    "from the neck to the lower back"   . 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 Depression   . Dysthymic disorder   . ED (erectile dysfunction)   . GERD (gastroesophageal reflux disease)   . History of acute pyelonephritis    01-07-2015  . History of Graves' disease    vs  Multinodular  . History of hiatal hernia   . History of kidney stones   . History of non-ST elevation myocardial infarction (NSTEMI)    Jan 2014--  no CAD;  per notes probable coronary vasospasm  . History of panic attacks   . History of septic shock    01-07-2015--  ureterolithias/ pyelonephritis  . History of thyroid storm    Nov 2011  . Hyperlipidemia   . Hypertension   . Hypothyroidism following radioiodine therapy    RAI in Mar 2012---  followed by dr Everardo All  . PAD (peripheral artery disease) [facility]) cardiologist-  dr Kirke Corin   a.  ABI (01/2014):  L 0.53; R 1.0 >> referred to [facility]  . Right ureteral stone   . Septic shock (HCC) 01/05/2015  . Sleep disturbance   . Thrombocytopenia (HCC)     Past Surgical History:   Procedure Laterality Date  . ABDOMINAL AORTAGRAM N/A 02/19/2014   Procedure: ABDOMINAL Ronny Flurry;  Surgeon: Iran Ouch, MD;  Location: [facility] CATH LAB;  Service: Cardiovascular;  Laterality: N/A;  . ANTERIOR CERVICAL DECOMP/DISCECTOMY FUSION  09-08-2010   C3 -4  . BACK SURGERY     x5  . CYSTOSCOPY W/ URETERAL STENT PLACEMENT Right 01/04/2015   Procedure: CYSTOSCOPY WITH RETROGRADE PYELOGRAM/URETERAL STENT PLACEMENT;  Surgeon: Marcine Matar, MD;  Location: [facility] OR;  Service: Urology;  Laterality: Right;  . CYSTOSCOPY W/ URETERAL STENT REMOVAL Right 01/26/2015   Procedure: CYSTOSCOPY WITH STENT REMOVAL;  Surgeon: Marcine Matar, MD;  [facility];  Service: Urology;  Laterality: Right;  . CYSTOSCOPY/URETEROSCOPY/HOLMIUM LASER/STENT PLACEMENT Right 01/26/2015   Procedure: CYSTOSCOPY/URETEROSCOPY RIGHT;  Surgeon: Marcine Matar, MD;  Location: [facility];  Service: Urology;  Laterality: Right;  . FEMORAL-POPLITEAL BYPASS GRAFT Left 09/11/2015   Procedure: BYPASS GRAFT LEFT ABOVE KNEE TO BELOW KNEE POPLITEAL ARTERY WITH LEFT GREATER SAPHENOUS VEIN;  Surgeon: Nada Libman, MD;  Location: [facility] OR;  Service: Vascular;  Laterality: Left;  . LAMINECTOMY AND MICRODISCECTOMY LUMBAR SPINE  03-26-2009   left L5 -- S1  . LEFT HEART CATHETERIZATION WITH CORONARY  ANGIOGRAM N/A 06/12/2012   Procedure: LEFT HEART CATHETERIZATION WITH CORONARY ANGIOGRAM;  Surgeon: Josue Hector, MD;  Location: [facility] CATH LAB;  Service: Cardiovascular;  Laterality: N/A;   No sig. CAD/  normal LVF, ef 55-65%  . LOWER EXTREMITY ANGIOGRAM Left 05/28/2014   Procedure: LOWER EXTREMITY ANGIOGRAM;  Surgeon: Wellington Hampshire, MD;  Location: [facility] CATH LAB;  Service: Cardiovascular;  Laterality: Left;  Failed PTA CTO  . POPLITEAL ARTERY ANGIOPLASTY Left 05/28/2014    dr Fletcher Anon   Attempted and unsuccessful due to inability to cross the occlusionnotes   . POSTERIOR CERVICAL FUSION/FORAMINOTOMY N/A  12/10/2012   Procedure: POSTERIOR CERVICAL FUSION/FORAMINOTOMY LEVEL 1;  Surgeon: Ophelia Charter, MD;  Location: [place] NEURO ORS;  Service: Neurosurgery;  Laterality: N/A;  Cervical three-four posterior cervical fusion with lateral mass screws  . POSTERIOR LUMBAR FUSION  11-11-2009;   07-15-2013   L5 -- S1;   L4-- S1  . SHOULDER ARTHROSCOPY Right 03-08-2004   debridement labral tear/  DCR/  acromioplasty  . TRANSTHORACIC ECHOCARDIOGRAM  01-05-2015   mild LVH/  ef 60-65%/  mild TR  . UMBILICAL HERNIA REPAIR  1980  . VEIN HARVEST Left 09/11/2015   Procedure: LEFT GREATER SAPHENOUS VEIN HARVEST;  Surgeon: Serafina Mitchell, MD;  Location: [place];  Service: Vascular;  Laterality: Left;    There were no vitals filed for this visit.       Subjective Assessment - 12/18/15 1237    Subjective Attending GI clinic.  Will be having surgery when scheduled   Patient is accompained by: Family member  wife   Patient Stated Goals education   Currently in Pain? Yes   Pain Score 6    Pain Location Abdomen   Pain Orientation Mid   Pain Descriptors / Indicators Aching   Pain Type Acute pain   Pain Onset 1 to 4 weeks ago   Aggravating Factors  nothing   Pain Relieving Factors nothing   Multiple Pain Sites No            [facility] PT Assessment - 12/18/15 0001      Assessment   Medical Diagnosis Colon Cancer   Referring Provider Dr. Betsy Coder   Onset Date/Surgical Date 12/08/15   Prior Therapy None     Precautions   Precautions Other (comment)  cancer precautions     Restrictions   Weight Bearing Restrictions No     Balance Screen   Has the patient fallen in the past 6 months No   Has the patient had a decrease in activity level because of a fear of falling?  No   Is the patient reluctant to leave their home because of a fear of falling?  No     Home Environment   Living Environment Private residence   Available Help at Discharge Family   Type of [place]     Prior Function    Level of Independence Independent   Vocation On disability     Cognition   Overall Cognitive Status Within Functional Limits for tasks assessed     Observation/Other Assessments   Observations scar on left inner leg that is raised and healed   Focus on Therapeutic Outcomes (FOTO)  Therapist discretion due to back and cervical surgeries 50% limitation     ROM / Strength   AROM / PROM / Strength AROM;Strength     AROM   Overall AROM Comments lumbar flexion and extension decreased by 50%  Strength   Overall Strength Within functional limits for tasks performed     Balance   Balance Assessed --  single leg stance 3 sec bil.                            PT Education - 12/18/15 1241    Education provided Yes   Education Details scar massage, balance ex., walking program, tips to conserve energy, information on colon cancer and exercise, toileting technique   Person(s) Educated Patient;Spouse   Methods Explanation;Demonstration;Verbal cues;Handout   Comprehension Returned demonstration;Verbalized understanding             PT Long Term Goals - 12/18/15 1248      PT LONG TERM GOAL #1   Title understand importance of a walking program for his recovery   Baseline uneducated   Period Days   Status Achieved     PT LONG TERM GOAL #2   Title understand how to perform scar massage for after surgery   Baseline not educated yet   Time 1   Status Achieved     PT LONG TERM GOAL #3   Title understand how to toilet correctly to not strain abdomen after surgery   Baseline not educated yet   Time 1   Period Days   Status Achieved     PT LONG TERM GOAL #4   Title understand ways to conserve energy to recover with treatment   Baseline not educated yet   Time 1   Period Days   Status Achieved               Plan - 12/18/15 1243    Clinical Impression Statement Patient is a 50 year old male with diagnosis of colon cancer on 12/08/2015 by colonoscopy.   Patient will be having a partial cholectomy then followed by chemotherapy.  Patient has a history of 4 lumbar surgeries and 3 cervical surgeries.  Physical therapy consulted the medical team about the patients care.  Patient reports 6/10 achy pain in the abdominal area with not reason for pain to be worse or better.  Patient has limited lumbar ROM.  Patient benefitted from therapy to educate him on his recovery and ways to manage it.    Rehab Potential Excellent   Clinical Impairments Affecting Rehab Potential none   PT Frequency 1x / week   PT Duration Other (comment)  1 time session in GI clinic   PT Treatment/Interventions ADLs/Self Care Home Management;Patient/family education;Balance training   PT Next Visit Plan Discharge this visit   PT Home Exercise Plan Current program given to him at this visit   Recommended Other Services None   Consulted and Agree with Plan of Care Patient;Family member/caregiver   Family Member Consulted wife      Patient will benefit from skilled therapeutic intervention in order to improve the following deficits and impairments:  Decreased balance, Increased fascial restricitons, Other (comment) (not educated rehab)  Visit Diagnosis: Muscle weakness (generalized) - Plan: PT plan of care cert/re-cert     Problem List Patient Active Problem List   Diagnosis Date Noted  . Aftercare following surgery of the circulatory system 09/23/2015  . Left leg claudication ([place]) 09/11/2015  . Ureterolithiasis 01/04/2015  . ED (erectile dysfunction) 12/09/2014  . Hypothyroidism following radioiodine therapy 05/26/2014  . PAD (peripheral artery disease) ([place]) 02/11/2014  . Lumbar facet arthropathy 07/15/2013  . Chest pain, mid sternal 06/12/2012  . Acute non-ST  segment elevation myocardial infarction ([place]) 06/11/2012  . Hyperlipidemia 06/11/2012  . 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 Hypertension 03/16/2010    Earlie Counts, PT 12/18/15 12:53  PM    [facility] [address], [place], [place], [address] Phone: [phone]   Fax:  [phone]  Name: Jeff [name] MRN: [id number] Date of Birth: 1965-10-01

## 2015-12-18 NOTE — Progress Notes (Signed)
[facility] Patient Consult   Referring MD: [name] 50 y.o.  11/08/1965    Reason for Referral: Colon cancer   HPI: He reports rectal bleeding with bowel movements beginning in early 2017. He underwent left lower extremity vascular bypass surgery on 09/11/2015 for treatment of claudication. He was referred to Dr. Carlean Purl and was taken to a colonoscopy on 12/08/2015. A partially obstructing mass was found in the sigmoid colon. The mass was biopsied. The area was tattooed. The mass was measured at 25 cm from the rectum. The pathology [id number]) revealed adenocarcinoma.  He was referred for CTs of the chest, abdomen, and pelvis on 12/11/2015. A few small scattered pulmonary nodules were noted felt to most likely be benign. Multiple hepatic lesions consistent with metastatic disease were seen. The sigmoid colon mass could not be easily seen. A few tiny nodes were noted in the sigmoid mesocolon. He was referred to Dr. [name] and is being scheduled for a laparoscopic sigmoid colectomy, liver biopsy, and Port-A-Cath placement.   Past Medical History:  Diagnosis Date  . Allergic rhinitis   . Anxiety   . Arthritis   . Asthma   . At risk for sleep apnea    STOP-BANG= 6       SENT TO PCP 01-22-2015  . Chronic back pain    "from the neck to the lower back"   . 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 Depression   . Dysthymic disorder   . ED (erectile dysfunction)   . GERD (gastroesophageal reflux disease)   . History of acute pyelonephritis    01-07-2015  . History of Graves' disease    vs  Multinodular  . History of hiatal hernia   . History of kidney stones   . History of non-ST elevation myocardial infarction (NSTEMI)    Jan 2014--  no CAD;  per notes probable coronary vasospasm  . History of panic attacks   . History of  septic shock    01-07-2015--  ureterolithias/ pyelonephritis  . History of thyroid storm    Nov 2011  . Hyperlipidemia   . Hypertension   . Hypothyroidism following radioiodine therapy    RAI in Mar 2012---  followed by dr Loanne Drilling  . PAD (peripheral artery disease) [facility]) cardiologist-  dr Fletcher Anon   a.  ABI (01/2014):  L 0.53; R 1.0 >> referred to PV  . Right ureteral stone   . Septic shock ([place]) 01/05/2015  . Sleep disturbance   . History of Thrombocytopenia ([place])     Past Surgical History:  Procedure Laterality Date  . ABDOMINAL AORTAGRAM N/A 02/19/2014   Procedure: ABDOMINAL Maxcine Ham;  Surgeon: Wellington Hampshire, MD;  Location: [facility] CATH LAB;  Service: Cardiovascular;  Laterality: N/A;  . ANTERIOR CERVICAL DECOMP/DISCECTOMY FUSION  09-08-2010   C3 -4  . BACK SURGERY     x5  . CYSTOSCOPY W/ URETERAL STENT PLACEMENT Right 01/04/2015   Procedure: CYSTOSCOPY WITH RETROGRADE PYELOGRAM/URETERAL STENT PLACEMENT;  Surgeon: Franchot Gallo, MD;  Location: [facility];  Service: Urology;  Laterality: Right;  . CYSTOSCOPY W/ URETERAL STENT REMOVAL Right 01/26/2015   Procedure: CYSTOSCOPY WITH STENT REMOVAL;  Surgeon: Franchot Gallo, MD;  Location: [facility];  Service: Urology;  Laterality: Right;  . CYSTOSCOPY/URETEROSCOPY/HOLMIUM LASER/STENT PLACEMENT Right 01/26/2015   Procedure: CYSTOSCOPY/URETEROSCOPY RIGHT;  Surgeon: Franchot Gallo,  MD;  Location: [facility];  Service: Urology;  Laterality: Right;  . FEMORAL-POPLITEAL BYPASS GRAFT Left 09/11/2015   Procedure: BYPASS GRAFT LEFT ABOVE KNEE TO BELOW KNEE POPLITEAL ARTERY WITH LEFT GREATER SAPHENOUS VEIN;  Surgeon: Serafina Mitchell, MD;  Location: [facility];  Service: Vascular;  Laterality: Left;  . LAMINECTOMY AND MICRODISCECTOMY LUMBAR SPINE  03-26-2009   left L5 -- S1  . LEFT HEART CATHETERIZATION WITH CORONARY ANGIOGRAM N/A 06/12/2012   Procedure: LEFT HEART CATHETERIZATION WITH CORONARY ANGIOGRAM;  Surgeon: Josue Hector, MD;  Location: [facility] CATH LAB;  Service: Cardiovascular;  Laterality: N/A;   No sig. CAD/  normal LVF, ef 55-65%  . LOWER EXTREMITY ANGIOGRAM Left 05/28/2014   Procedure: LOWER EXTREMITY ANGIOGRAM;  Surgeon: Wellington Hampshire, MD;  Location: [facility] CATH LAB;  Service: Cardiovascular;  Laterality: Left;  Failed PTA CTO  . POPLITEAL ARTERY ANGIOPLASTY Left 05/28/2014    dr Fletcher Anon   Attempted and unsuccessful due to inability to cross the occlusionnotes   . POSTERIOR CERVICAL FUSION/FORAMINOTOMY N/A 12/10/2012   Procedure: POSTERIOR CERVICAL FUSION/FORAMINOTOMY LEVEL 1;  Surgeon: Ophelia Charter, MD;  Location: [facility] NEURO ORS;  Service: Neurosurgery;  Laterality: N/A;  Cervical three-four posterior cervical fusion with lateral mass screws  . POSTERIOR LUMBAR FUSION  11-11-2009;   07-15-2013   L5 -- S1;   L4-- S1  . SHOULDER ARTHROSCOPY Right 03-08-2004   debridement labral tear/  DCR/  acromioplasty  . TRANSTHORACIC ECHOCARDIOGRAM  01-05-2015   mild LVH/  ef 60-65%/  mild TR  . UMBILICAL HERNIA REPAIR  1980  . VEIN HARVEST Left 09/11/2015   Procedure: LEFT GREATER SAPHENOUS VEIN HARVEST;  Surgeon: Serafina Mitchell, MD;  Location: [facility];  Service: Vascular;  Laterality: Left;    Medications: Reviewed  Allergies:  Allergies  Allergen Reactions  . Vicodin [Hydrocodone-Acetaminophen] Hives    Family history: His maternal grandmother had lung cancer. No other family history of cancer.  Social History:   He lives in [place]. He is unemployed. He proves worked as a Administrator. He does not use cigarettes. He reports social alcohol use. No transfusion history. No risk factor for HIV or hepatitis.   ROS:   Positives include: Intermittent rectal bleeding, constipation, exertional dyspnea, decreased visual acuity, left leg claudication-resolved following bypass surgery  A complete ROS was otherwise negative.  Physical Exam:  Blood pressure 129/79, pulse 85, temperature 98 F (36.7 C),  temperature source Oral, resp. rate 20, height 5\' 10"  (1.778 m), weight 258 lb 8 oz (117.3 kg), SpO2 99 %.  HEENT: Oropharynx without visible mass, neck without mass Lungs: Clear bilaterally Cardiac: Regular rate and rhythm Abdomen: No hepatosplenomegaly, no mass, nontender GU: Testes without mass  Vascular: No leg edema Lymph nodes: No cervical, supraclavicular, axillary, or inguinal nodes Neurologic: Alert and oriented, the motor exam appears intact in the upper and lower extremities Skin: No rash Musculoskeletal: No spine tenderness   LAB:  CBC  Lab Results  Component Value Date   WBC 7.2 12/08/2015   HGB 12.4 (L) 12/08/2015   HCT 38.3 (L) 12/08/2015   MCV 78.2 12/08/2015   PLT 189.0 Repeated and verified X2. 12/08/2015   NEUTROABS 3.5 12/08/2015     CMP      Component Value Date/Time   NA 137 12/08/2015 1541   K 3.9 12/08/2015 1541   CL 105 12/08/2015 1541   CO2 26 12/08/2015 1541   GLUCOSE 75 12/08/2015 1541   BUN 7  12/08/2015 1541   CREATININE 1.01 12/08/2015 1541   CREATININE 1.21 06/30/2015 0849   CALCIUM 8.7 12/08/2015 1541   PROT 6.8 12/08/2015 1541   ALBUMIN 3.8 12/08/2015 1541   AST 36 12/08/2015 1541   ALT 37 12/08/2015 1541   ALKPHOS 59 12/08/2015 1541   BILITOT 0.5 12/08/2015 1541   GFRNONAA >60 09/12/2015 0000   GFRAA >60 09/12/2015 0000    No results found for: CEA1  Imaging: CTs 12/03/2015-reviewed   Assessment/Plan:   1. Sigmoid colon cancer, status post partially obstructing mass noted on endoscopy 12/08/2015, biopsy confirmed adenocarcinoma  CTs chest, abdomen, and pelvis on 12/11/2015-indeterminate tiny pulmonary nodules, multiple liver metastases, small nodes in the sigmoid mesocolon  2.   Rectal bleeding and constipation secondary to #1  3.   History of peripheral vascular disease, status post left lower extremity vascular bypass surgery in April 2017  4.   History of nephrolithiasis  5.   History of Graves' disease treated  with radioactive iodine  6.   Anxiety/depression  7.   Hypertension   Disposition:   Jeff [name] has been diagnosed with sigmoid colon cancer. Staging CT scans are consistent with metastatic disease involving the liver. His case was presented at the GI tumor conference on 12/16/2015. The liver metastases do not appear amenable to surgical resection.  He has obstructive symptoms and bleeding. The recommendation from the tumor conference is to proceed with resection of the sigmoid colon tumor, biopsy of a liver lesion, and placement of a Port-A-Cath.  Jeff [name] would like to proceed with surgery as soon as possible. I will contact Dr. [name] to expedient surgical scheduling.  I will see Jeff [name] following surgery to discuss systemic treatment options. No therapy will be curative. We will base systemic therapy on the molecular testing from the sigmoid colon tumor.  Approximately 50 minutes were spent with the patient today. The majority of the time was used for counseling and coordination of care.  Betsy Coder, MD  12/18/2015, 5:36 PM

## 2015-12-18 NOTE — Telephone Encounter (Signed)
per pof to sch pt appt-cld & left pt appt and gave pt time & date of appt on 8/18@12 :30

## 2015-12-18 NOTE — Patient Instructions (Signed)
Care Plan Summary- 12/18/2015 Name:  Jeff [name]      DOB: Mar 28, 1966 Your Medical Team:  Medical Oncologist:  Dr. Ma Rings Radiation Oncologist:    Surgeon:   Dr. Elmo Putt Thomas/Dr. Alphonsa Overall Type of Cancer: Adenocarcinoma of Colon  Stage/Grade: Stage IV  *Exact staging of your cancer is based on size of the tumor, depth of invasion, involvement of lymph nodes or not, and whether or not the cancer has spread beyond the primary site.   Recommendations: Based on information available as of today's consult. Recommendations may change depending on the results of further tests or exams. 1) Surgery due to risk of obstruction 2) Adjuvant chemotherapy (after surgery) 3) Genetics referral in future 4) All 1st degree relatives need to have colonoscopy at age 56-45 ___________________________________________________________________________ Next Steps: 1) [facility] will call with new surgical date-will try to get Dr. Lucia Gaskins to do surgery 2) Return to Dr. Benay Spice on 01/01/16 to discuss chemo plans (may adjust date) 3) Work on your nutrition and stay active Questions? Merceda Elks, RN, BSN at [phone]. Manuela Schwartz is your Oncology Nurse Navigator and is available to assist you while you're receiving your medical care at [facility].

## 2015-12-18 NOTE — Telephone Encounter (Signed)
Per staff message and POF I have scheduled appts. Advised scheduler of appts. JMW  

## 2015-12-18 NOTE — Patient Instructions (Signed)
Scar Massage  Scar massage is done to improve the mobility of scar, decrease scar tissue from building up, reduce adhesions, and prevent Keloids from forming. Start scar massage after scabs have fallen off by themselves and no open areas. The first few weeks after surgery, it is normal for a scar to appear pink or red and slightly raised. Scars can itch or have areas of numbness. Some scars may be sensitive.   Direct Scar massage: after scar is healed, no opening, no scab 1.  Place pads of two fingers together directly on the scar starting at one end of the scar. Move the fingers up and down across the scar holding 5 seconds one direction.  Then go opposite direction hold 5 seconds.  2. Move over to the next section of the scar and repeat.  Work your way along the entire length of the scar.   3. Next make diagonal movements along the scar holding 5 seconds at one direction. 4. Next movement is side to side. 5. Do not rub fingers over the scar.  Instead keep firm pressure and move scar over the tissue it is on top   Scar Lift and Roll 12 weeks after surgery. 1. Pinch a small amount of the scar between your first two fingers and thumb.  2. Roll the scar between your fingers for 5 to 15 seconds. 3. Move along the scar and repeat until you have massaged the entire length of scar.   Stop the massage and call your doctor if you notice: 1. Increased redness 2. Bleeding from scar 3. Seepage coming from the scar 4. Scar is warmer and has increased pain   Tandem Stance    Can hold onto counter if not feeling steady. Right foot in front of left, heel touching toe both feet "straight ahead". Stand on Foot Triangle of Support with both feet. Balance in this position _30__ seconds. Do with left foot in front of right. 3 times each  Copyright  VHI. All rights reserved.    Stance: single leg on floor. Raise leg. Hold _15__ seconds. Repeat with other leg. __3_ reps per set, _1__ sets per day .   Can hold onto counter to keep steady  Copyright  VHI. All rights reserved.    Tips for Energy Conservation for Activities of Daily Living . Plan ahead to avoid rushing. . Sit down to bathe and dry off. Wear a terry robe instead of drying off. . Use a shower/bath organizer to decrease leaning and reaching. . Use extension handles on sponges and brushes. Susa Simmonds grab rails in the bathroom or use an elevated toilet seat. Hoyle Barr out clothes and toiletries before dressing. . Minimize leaning over to put on clothes and shoes. Bring your foot to your knee to apply socks and shoes. . Wear comfortable shoes and low-heeled, slip on shoes. Wear button front shirts rather than pullovers. Housekeeping . Schedule household tasks throughout the week. . Do housework sitting down when possible. . Delegate heavy housework, shopping, laundry and child care when possible. . Drag or slide objects rather than lifting. . Sit when ironing and take rest periods. . Stop working before becoming overly tired. Shopping . Organize list by aisle. . Use a grocery cart for support. Marland Kitchen Shop at less busy times. . Ask for help with getting to the car. Meal Preparation . Use convenience and easy-to-prepare foods. . Use small appliances that take less effort to use. Marland Kitchen Prepare meals sitting down. . Soak dishes  instead of scrubbing and let dishes air dry. . Prepare double portions and freeze half. Child Care . Plan activities that can be done sitting down, such as drawing pictures, playing games, reading, and computer games. . Encourage children to climb up onto your lap or into the highchair instead of being lifted. . Make a game of the household chores so that children will want to help. . Delegate child care when possible.  Earlie Counts, PT, [facility]; [place], [place], [place] [address] Toileting Techniques for Bowel Movements (Defecation) Using your belly  (abdomen) and pelvic floor muscles to have a bowel movement is usually instinctive.  Sometimes people can have problems with these muscles and have to relearn proper defecation (emptying) techniques.  If you have weakness in your muscles, organs that are falling out, decreased sensation in your pelvis, or ignore your urge to go, you may find yourself straining to have a bowel movement.  You are straining if you are: . holding your breath or taking in a huge gulp of air and holding it  . keeping your lips and jaw tensed and closed tightly . turning red in the face because of excessive pushing or forcing . developing or worsening your  hemorrhoids . getting faint while pushing . not emptying completely and have to defecate many times a day  If you are straining, you are actually making it harder for yourself to have a bowel movement.  Many people find they are pulling up with the pelvic floor muscles and closing off instead of opening the anus. Due to lack pelvic floor relaxation and coordination the abdominal muscles, one has to work harder to push the feces out.  Many people have never been taught how to defecate efficiently and effectively.  Notice what happens to your body when you are having a bowel movement.  While you are sitting on the toilet pay attention to the following areas: . Jaw and mouth position . Angle of your hips   . Whether your feet touch the ground or not . Arm placement  . Spine position . Waist . Belly tension . Anus (opening of the anal canal)  An Evacuation/Defecation Plan   Here are the 4 basic points:  1. Lean forward enough for your elbows to rest on your knees 2. Support your feet on the floor or use a low stool if your feet don't touch the floor  3. Push out your belly as if you have swallowed a beach ball-you should feel a widening of your waist 4. Open and relax your pelvic floor muscles, rather than tightening around the anus      The following  conditions my require modifications to your toileting posture:  . If you have had surgery in the past that limits your back, hip, pelvic, knee or ankle flexibility . Constipation   Your healthcare practitioner may make the following additional suggestions and adjustments:  1) Sit on the toilet  a) Make sure your feet are supported. b) Notice your hip angle and spine position-most people find it effective to lean forward or raise their knees, which can help the muscles around the anus to relax  c) When you lean forward, place your forearms on your thighs for support  2) Relax suggestions a) Breath deeply in through your nose and out slowly through your mouth as if you are smelling the flowers and blowing out the candles. b) To become aware of how to  relax your muscles, contracting and releasing muscles can be helpful.  Pull your pelvic floor muscles in tightly by using the image of holding back gas, or closing around the anus (visualize making a circle smaller) and lifting the anus up and in.  Then release the muscles and your anus should drop down and feel open. Repeat 5 times ending with the feeling of relaxation. c) Keep your pelvic floor muscles relaxed; let your belly bulge out. d) The digestive tract starts at the mouth and ends at the anal opening, so be sure to relax both ends of the tube.  Place your tongue on the roof of your mouth with your teeth separated.  This helps relax your mouth and will help to relax the anus at the same time.  3) Empty (defecation) a) Keep your pelvic floor and sphincter relaxed, then bulge your anal muscles.  Make the anal opening wide.  b) Stick your belly out as if you have swallowed a beach ball. c) Make your belly wall hard using your belly muscles while continuing to breathe. Doing this makes it easier to open your anus. d) Breath out and give a grunt (or try using other sounds such as ahhhh, shhhhh, ohhhh or grrrrrrr).  4) Finish a) As you finish  your bowel movement, pull the pelvic floor muscles up and in.  This will leave your anus in the proper place rather than remaining pushed out and down. If you leave your anus pushed out and down, it will start to feel as though that is normal and give you incorrect signals about needing to have a bowel movement.    Earlie Counts, PT [facility] [address] [place],  [address] The Importance of Exercise as it Relates to Colon Cancer New research suggests that people who have been treated for colon cancer can reduce their risk of the cancer coming back and improve their odds of survival by as much as 50% if they engage in regular exercise. This is because of the effect exercise has on chemicals in the body that are related to cancer development and progression. Studies have also shown a 24% lower risk of colon cancer in people who are physically active compared to those who are not.  Make it your goal to achieve the American Cancer Society's recommendation to exercise at a moderate to vigorous pace for 30 to 60 minutes at least 5 days a week. You can do this by walking, biking, taking a Zumba class, swimming, or any combination of activities that keeps you moving for that length of time. Not sure how to start an exercise program? . Start small and set reasonable interim goals.  Don't expect to spend hours in the gym. . If you want to change how your body looks, take a "before" picture and look at it from time to time to see if you notice changes in your weight or muscle tone as a result of your workouts. . Find a friend to exercise with.  You can each help the other stay on track and have fun too. . Schedule time to exercise.  It's been found that more people stick with an exercise program if they do it before their workday begins. . Making only one change at a time with your exercise program can reduce the chance of setting yourself up for failure. How to stay  motivated . Choose a type of exercise that you enjoy-you'll be more likely to stick with it. Marland Kitchen  Keep in mind how good it feels after you have exercised!  . If you are feeling achy, sore, or fatigued, it's okay to limit your exercise that day. Then don't beat yourself up about not doing enough. Instead, feel good about what you were able to do.  . Make a reward system for yourself. . Have a buddy to exercise with; encourage each other. . Enroll in a class so others expect you to be there. While the following suggestions increase overall activity and general health, remember moderate to vigorous exercise is still important. Here are some ideas for increasing activity: . Go for a walk to help general endurance. . Take the stairs instead of the elevator for endurance, strength, and improving function. . Try a yoga class to work on your flexibility. . Do resistance training with light weights or resistive bands to build strength. . Take frequent breaks throughout the day to stand, stretch, and take short walks.  This can help you feel better both physically and mentally.  Earlie Counts, PT [place], [place] [address]   Ways to get started on an exercise program 1.  Start for 10 minutes per day with a walking program. 2. Work towards 30 minutes of exercise per day 3. When you do an aerobic exercise program start on a low level 4. Water aerobics is a good place due to decreased strain on your joints 5. Begin your exercise program gradually and progress slowly over time 6. When exercising use correct form. a. Keep neutral spine b. Engage abdominals c. Keep chest up  d. Chin down e. Do not lock your knees  Earlie Counts, PT Outpatient Rehab at [facility] [address] [place],  [address] [phone]

## 2015-12-18 NOTE — Progress Notes (Signed)
Jeff [name]  GI CLINIC Psychosocial Distress Screening Clinical Social Work  Clinical Social Work met with pt and his wife, Jeff [name] at [facility] to introduce self, explain role of CSW/Pt and Family Support Team and to review distress screening protocol.was referred by distress screening protocol.  The patient scored a 7 on the Psychosocial Distress Thermometer which indicates severe distress. Clinical Social Worker  assess for distress and other psychosocial needs.   Pt reports to live with his wife and and 24 yo grandson and step that are supportive. Pt reports to have some issues with anxiety and depression. The anxiety is related to his diagnosis, but the depression he has had for a bout a year and he feels the Lexapro is not managing this very well. CSW encouraged pt to discuss medications for this with his PCP that provided this medication. Pt has had trouble sleeping as well and CSW mentioned this to RN as well. CSW reviewed common emotions/reactions and coping with advanced cancer diagnosis. CSW discussed coping techniques.  Pt considering GI group as an option to cope as well. CSW encouraged pt to consider counseling through CSW/counseling interns as well. Pt provided with CSW/Pt and Family Support team info sheet and resource sheet, as well as, contact info for CSW. Pt and wife agree to reach out as needed.   ONCBCN DISTRESS SCREENING 12/18/2015  Screening Type Initial Screening  Distress experienced in past week (1-10) 7  Emotional problem type Nervousness/Anxiety;Adjusting to illness  Spiritual/Religous concerns type Facing my mortality  Physical Problem type Pain  Physician notified of physical symptoms Yes  Referral to support programs Yes    Clinical Social Worker follow up needed: Yes.    If yes, follow up plan: See above Loren Racer, [place] Worker [place]  [facility] Phone: [phone] Fax: [phone]

## 2015-12-23 ENCOUNTER — Encounter: Payer: Self-pay | Admitting: Surgery

## 2015-12-23 ENCOUNTER — Other Ambulatory Visit: Payer: Self-pay | Admitting: Surgery

## 2015-12-23 ENCOUNTER — Telehealth: Payer: Self-pay | Admitting: *Deleted

## 2015-12-23 NOTE — H&P (Signed)
Jeff [name] Location: [facility] Patient #: [id number] DOB: February 21, 1966 Married / Language: English / Race: Black or African American Male  History of Present Illness   Patient words: recheck.   The patient is a 50 year old male who presents with colorectal cancer.  The PCP is Lucita Lora, NP.  His onolcogist is Dr. Jacinto Reap. Sherrill.  He is accompanied by his wife.   He was originally seen by my partner Dr. Joyice Faster, but she could not do his surgery until the end of August. He requested an earlier surgery time.  The patient is a 50 year old male who presents a sigmoid colon cancer. He states that he has had blood with bowel movements for approximately 6 months. He delayed his colonoscopy 3 months due to having surgery on his leg. He underwent a tibioperoneal trunk bypass graft for left leg claudication by Dr. Pierre Bali on 09/11/2015. The patient states he has constipation at baseline. He was able to complete his bowel prep without difficulty. He continues to have hard stool but has not noticed much blood. He does have some abdominal cramping prior to bowel movements. He does not take any stool softeners at this time. The patient denies any weight loss. He had a colonoscopy by dr. Darci Current on 12/08/2015 which he saw an obstructing sigmoid colon ca (at 25 cm). CT scan of abdomen on 12/11/2015 - 1. The sigmoid colon lesion is very difficult to identified on the CT scan. Small lymph nodes are noted in the sigmoid mesocolon and there are a few borderline enlarged periportal lymph nodes. Unfortunately, there is also hepatic metastatic disease. 2. A few scattered pulmonary nodules are indeterminate and will require surveillance. CEA - 321 - 12/08/2015  I reviewed with the patient the findings and need for colon surgery. I discussed both surgical and non surgical options. I discussed the role of laparoscopic and open surgery in colon surgery.  I reviewed the risks of surgery, including, but not limited to, infection, bleeding, nerve injury, anastomotic leaks, and possibility of colostomy. I went over the preoperative mechanical and antibiotic bowel prep for colon surgery and provided prescriptions for these. I provided the patient literature on colon surgery. I tried to answer all questions for the patient.  Power port placement - I discussed the indications and potential complications of the power port placement. The primary complications of the power port, include, but are not limited to, bleeding, infection, nerve injury, thrombosis, and pneumothorax.  Past Medical History: 1. CAD - had cath by Dr. Edmonia James 06/12/2012 HIstory of MI about 2103 Echo - 01/05/2015 - EF - 60% Followed by Dr. Rod Can 2. Multiple back and neck operations Last surgery by Dr. Orinda Kenner - 12/10/2012 - C3-C4 arthrodesis He says that he has had 5 back/neck operations 3. Nephrolithiasis Obstructed right ureter - 01/04/2015 - Dahlstedt 4. PVD - left leg bypass - 09/11/2015 - [facility]ham 5. Never smoked 6. He also has hypothyroidism following radioiodine therapy for multinodular goiter 7. Umbilical hernia repair at age 84  Social History: Married Has not worked since 2010. Has sought disability, but is not on disability Has 5 children, one grandchild  Allergies (April Staton, CMA; 12/23/2015 1:42 PM) Vicodin *ANALGESICS - OPIOID*  Medication History (April Staton, CMA; 12/23/2015 1:42 PM) Neomycin Sulfate (500MG  Tablet, 2 (two) Tablet Oral SEE NOTE, Taken starting 12/16/2015) Active. (TAKE TWO TABLETS AT 2 PM, 3 PM, AND 10 PM THE DAY PRIOR TO SURGERY) Flagyl (500MG  Tablet, 2 (two) Tablet  Oral SEE NOTE, Taken starting 12/16/2015) Active. (Take at 2pm, 3pm, and 10pm the day prior to your colon operation) AmLODIPine Besylate (5MG  Tablet, Oral) Active. Atorvastatin Calcium (40MG  Tablet,  Oral) Active. DiazePAM (10MG  Tablet, Oral) Active. Pantoprazole Sodium (40MG  Tablet DR, Oral) Active. Tamsulosin HCl (0.4MG  Capsule, Oral) Active. Omeprazole (40MG  Capsule DR, Oral) Active. Levothyroxine Sodium (112MCG Tablet, Oral) Active. Escitalopram Oxalate (20MG  Tablet, Oral) Active. TiZANidine HCl (4MG  Tablet, Oral) Active. Oxycodone-Acetaminophen (10-325MG  Tablet, Oral as needed) Active. Sildenafil Citrate (20MG  Tablet, Oral) Active. Metoprolol Succinate ER (25MG  Tablet ER 24HR, Oral two times daily) Active. Aspirin (81MG  Tablet Chewable, Oral) Active. Valium (10MG  Tablet, Oral) Active. Medications Reconciled  Vitals (April Staton CMA; 12/23/2015 1:43 PM) 12/23/2015 1:42 PM Weight: 259 lb Height: 70in Height was reported by patient. Body Surface Area: 2.33 m Body Mass Index: 37.16 kg/m  Temp.: 97.110F(Oral)  Pulse: 90 (Regular)  P.OX: 98% (Room air) BP: 124/80 (Sitting, Left Arm, Standard)   Physical Exam  General: WN mildly obese AA M alert and generally healthy appearing. Skin: Inspection and palpation of the skin unremarkable.  Eyes: Conjunctivae white, pupils equal. Face, ears, nose, mouth, and throat: Face - normal. Normal ears and nose. Lips and teeth normal.  Neck: Supple. No mass. Trachea midline. No thyroid mass. Lymph Nodes: No supraclavicular or cervical adenopathy. No inguinal adenopathy.  Lungs: Normal respiratory effort. Clear to auscultation and symmetric breath sounds. Cardiovascular: Regular rate and rythm. Normal auscultation of the heart. No murmur or rub. Normal carotid pulse.  Abdomen: Soft. No mass. Liver and spleen not palpable. No tenderness. No hernia. Normal bowel sounds. He has a scar around his umbilicus.  Musculoskeletal/extremities: Normal gait. Good strength and ROM in upper and lower extremities. Digits and nails are unremarkable.  Neurologic: Grossly intact to motor and sensory function. No obvious deficit in  the cranial nerves. Psychiatric: Has normal mood and affect. Judgement and insight appear normal.  Assessment & Plan  1.  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  LIVER METASTASES (C78.7)  3. CAD - had cath by Dr. Edmonia James 06/12/2012 HIstory of MI about 2103 Echo - 01/05/2015 - EF - 60% Followed by Dr. Jerilynn Mages. Fletcher Anon 4. Multiple back and neck operations Last surgery by Dr. Orinda Kenner - 12/10/2012 - C3-C4 arthrodesis He says that he has had 5 back/neck operations 5. Nephrolithiasis Obstructed right ureter - 01/04/2015 - Dahlstedt 6. PVD - left leg bypass - 09/11/2015 - [facility]ham 7. He also has hypothyroidism following radioiodine therapy for multinodular goiter   Alphonsa Overall, MD, [facility] Pager: [phone] Office phone:  [phone]

## 2015-12-23 NOTE — Telephone Encounter (Signed)
Oncology Nurse Navigator Documentation  Oncology Nurse Navigator Flowsheets 12/23/2015  Navigator Location [facility]  Navigator Encounter Type Telephone  Telephone Incoming Call;Outgoing Call;Patient Update  Abnormal Finding Date -  Confirmed Diagnosis Date -  Patient Visit Type -  Treatment Phase -  Barriers/Navigation Needs Coordination of Care--surgery date sooner than 8/25 requested by Dr. Benay Spice.   Education -  Interventions -After communication with Dr. Benay Spice with Dr. Lucia Gaskins and Dr. [name], possible his surgery will be done on Friday. [facility] is checking with the OR schedule.  Coordination of Care -Updated wife on current status and that she should be hearing from [facility] soon.  Support Groups/Services -  Acuity -  Time Spent with Patient 30

## 2015-12-24 ENCOUNTER — Encounter (HOSPITAL_COMMUNITY)
Admission: RE | Admit: 2015-12-24 | Discharge: 2015-12-24 | Disposition: A | Discharge status: Home / Self Care | Payer: Medicaid Other | Source: Ambulatory Visit | Attending: Surgery | Admitting: Surgery

## 2015-12-24 ENCOUNTER — Encounter (HOSPITAL_COMMUNITY): Payer: Self-pay

## 2015-12-24 DIAGNOSIS — Z01812 Encounter for preprocedural laboratory examination: Secondary | ICD-10-CM | POA: Diagnosis not present

## 2015-12-24 LAB — ABO/RH: ABO/RH(D): O POS

## 2015-12-24 NOTE — Patient Instructions (Addendum)
Jeff [name]  12/24/2015   Your procedure is scheduled on: 12-25-15  Report to [facility] Main  Entrance take [facility]  elevators to 3rd floor to  [place] at 700 AM.  Call this number if you have problems the morning of surgery [phone]   Remember: ONLY 1 PERSON MAY GO WITH YOU TO SHORT STAY TO GET  READY MORNING OF [place].  Do not eat food  :After Midnight, Tuesday NIGHT CLEAR LIQUIDS ALL DAY Wednesday 12-24-15, FOLLOW ALL BOWEL PREP INSTRUCTIONS FROM DR Lucia Gaskins. NOTHING BY MOUTH AFTER MIDNIGHT TONIGHT.     Take these medicines the morning of surgery with A SIP OF WATER: AMLODIPINE (NORVASC), LEVOTHYROXINE (SYNTHROID), METOPROLOL, PANTAPRAZOLE (PROTONIX) ,OXYCODONE IF NEEDED, VENLAFAXINE (EFFEXOR)                               You may not have any metal on your body including hair pins and              piercings  Do not wear jewelry, make-up, lotions, powders or perfumes, deodorant             Do not wear nail polish.  Do not shave  48 hours prior to surgery.              Men may shave face and neck.   Do not bring valuables to the hospital. [place].  Contacts, dentures or bridgework may not be worn into surgery.  Leave suitcase in the car. After surgery it may be brought to your room.     Patients discharged the day of surgery will not be allowed to drive home.  Name and phone number of your driver:  Special Instructions: N/A              Please read over the following fact sheets you were given: _____________________________________________________________________             [facility] - Preparing for Surgery Before surgery, you can play an important role.  Because skin is not sterile, your skin needs to be as free of germs as possible.  You can reduce the number of germs on your skin by washing with CHG (chlorahexidine gluconate) soap before surgery.  CHG is an antiseptic cleaner  which kills germs and bonds with the skin to continue killing germs even after washing. Please DO NOT use if you have an allergy to CHG or antibacterial soaps.  If your skin becomes reddened/irritated stop using the CHG and inform your nurse when you arrive at Short Stay. Do not shave (including legs and underarms) for at least 48 hours prior to the first CHG shower.  You may shave your face/neck. Please follow these instructions carefully:  1.  Shower with CHG Soap the night before surgery and the  morning of Surgery.  2.  If you choose to wash your hair, wash your hair first as usual with your  normal  shampoo.  3.  After you shampoo, rinse your hair and body thoroughly to remove the  shampoo.                           4.  Use CHG  as you would any other liquid soap.  You can apply chg directly  to the skin and wash                       Gently with a scrungie or clean washcloth.  5.  Apply the CHG Soap to your body ONLY FROM THE NECK DOWN.   Do not use on face/ open                           Wound or open sores. Avoid contact with eyes, ears mouth and genitals (private parts).                       Wash face,  Genitals (private parts) with your normal soap.             6.  Wash thoroughly, paying special attention to the area where your surgery  will be performed.  7.  Thoroughly rinse your body with warm water from the neck down.  8.  DO NOT shower/wash with your normal soap after using and rinsing off  the CHG Soap.                9.  Pat yourself dry with a clean towel.            10.  Wear clean pajamas.            11.  Place clean sheets on your bed the night of your first shower and do not  sleep with pets. Day of Surgery : Do not apply any lotions/deodorants the morning of surgery.  Please wear clean clothes to the hospital/surgery center.  FAILURE TO FOLLOW THESE INSTRUCTIONS MAY RESULT IN THE CANCELLATION OF YOUR SURGERY PATIENT SIGNATURE_________________________________  NURSE  SIGNATURE__________________________________  ________________________________________________________________________

## 2015-12-24 NOTE — Progress Notes (Signed)
   12/24/15 0911  OBSTRUCTIVE SLEEP APNEA  Have you ever been diagnosed with sleep apnea through a sleep study? No  Do you snore loudly (loud enough to be heard through closed doors)?  1  Do you often feel tired, fatigued, or sleepy during the daytime (such as falling asleep during driving or talking to someone)? 0  Has anyone observed you stop breathing during your sleep? 0  Do you have, or are you being treated for high blood pressure? 1  BMI more than 35 kg/m2? 1  Age > 50 (1-yes) 0  Neck circumference greater than:Male 16 inches or larger, Male 17inches or larger? 1 (18.0 )  Male Gender (Yes=1) 1  Obstructive Sleep Apnea Score 5  Score 5 or greater  Results sent to PCP

## 2015-12-24 NOTE — Progress Notes (Signed)
EKG 06-18-15 EPIC ECHO 01-05-15 EPIC CBC WITH DIF, CMET 12-08-15 EPIC VASCULAR LOV 10-02-15 EPIC  CARDIOLOGY DR BRACKBILL LOV 06-18-15 EPIC

## 2015-12-25 ENCOUNTER — Inpatient Hospital Stay (HOSPITAL_COMMUNITY): Admission: RE | Admit: 2015-12-25 | Payer: Medicaid Other | Source: Ambulatory Visit | Admitting: Surgery

## 2015-12-25 ENCOUNTER — Encounter (HOSPITAL_COMMUNITY): Admission: RE | Payer: Self-pay | Source: Ambulatory Visit

## 2015-12-25 LAB — TYPE AND SCREEN
ABO/RH(D): O POS
Antibody Screen: NEGATIVE

## 2015-12-25 LAB — HEMOGLOBIN A1C
Hgb A1c MFr Bld: 6 % — ABNORMAL HIGH (ref 4.8–5.6)
Mean Plasma Glucose: 126 mg/dL

## 2015-12-25 SURGERY — LAPAROSCOPIC PARTIAL COLECTOMY
Anesthesia: General

## 2015-12-28 ENCOUNTER — Telehealth: Payer: Self-pay | Admitting: *Deleted

## 2015-12-28 NOTE — Telephone Encounter (Signed)
Oncology Nurse Navigator Documentation  Oncology Nurse Navigator Flowsheets 12/28/2015  Navigator Location [facility]  Navigator Encounter Type Telephone  Telephone Incoming Call;Patient Update  Abnormal Finding Date -  Confirmed Diagnosis Date -  Patient Visit Type -  Treatment Phase -  Barriers/Navigation Needs Family concerns;Coordination of Care--wife expresses concern that surgery now scheduled for 01/22/16 with Dr. [name] ( 8/25 spot not available now). She is also worried because his last stools (2 days) have been very dark, "almost black". She tells RN he is not taking any iron supplements.  Education Accessing Care/ Finding Providers--message to [facility] asking to place him on call list if a surgery cancels before 9/8 (providing Medicaid approval)  Interventions Coordination of Care--Dr. Benay Spice made aware of change in surgery date and when to reschedule his follow up with Dr. Benay Spice (currently 01/01/16)  Coordination of [place] with Patient 15

## 2015-12-29 ENCOUNTER — Inpatient Hospital Stay (HOSPITAL_COMMUNITY): Admission: RE | Admit: 2015-12-29 | Payer: Medicaid Other | Source: Ambulatory Visit

## 2015-12-31 ENCOUNTER — Telehealth: Payer: Self-pay | Admitting: *Deleted

## 2015-12-31 NOTE — Telephone Encounter (Signed)
Oncology Nurse Navigator Documentation  Oncology Nurse Navigator Flowsheets 12/31/2015  Navigator Location [facility]  Navigator Encounter Type Telephone  Telephone Outgoing Call;Appt Confirmation/Clarification;Symptom Mgt  Abnormal Finding Date -  Confirmed Diagnosis Date -  Patient Visit Type -  Treatment Phase -  Barriers/Navigation Needs -  Education -  Interventions Other--informed wife that his 8/18 visit with Dr. Benay Spice will be cancelled and rescheduled for 1 week after his surgery. Inquired if he was still having dark stools or if he felt weak, dizzy or short of breath like he may be anemic. Wife asked and he said no. Instructed her to call if this changes and we can check his counts to see if he is anemic and needs blood.  Coordination of Care Informed her that [facility] is awaiting Medicaid approval of his surgery and they will try to move it sooner once it is approved.  Support Groups/Services -  Acuity -  Time Spent with Patient 15

## 2015-12-31 NOTE — Telephone Encounter (Signed)
Oncology Nurse Navigator Documentation  Oncology Nurse Navigator Flowsheets 12/31/2015  Navigator Location [facility]  Navigator Encounter Type Telephone  Telephone Incoming Call;Outgoing Call;Symptom Mgt  Abnormal Finding Date -  Confirmed Diagnosis Date -  Patient Visit Type -  Treatment Phase -  Barriers/Navigation Needs -  Education -  Interventions Other--MD notified of pain  Coordination of Care -  Support Groups/Services -  Acuity -  Time Spent with Patient 15  Wife left VM that [place] is having more abdominal pain. Called back and spoke with her and she says he is taking the Percocet every 6 hours prn (takes about 2-3/day) and it helps, but just wanted MD aware that the pain has increased since his visit. Bowels are moving daily with MiraLax twice daily. Has mild sensation of nausea, but no emesis.

## 2016-01-01 ENCOUNTER — Ambulatory Visit: Payer: Medicaid Other | Admitting: Oncology

## 2016-01-04 ENCOUNTER — Ambulatory Visit: Payer: Medicaid Other | Admitting: Oncology

## 2016-01-05 ENCOUNTER — Encounter: Payer: Self-pay | Admitting: Cardiovascular Disease

## 2016-01-05 ENCOUNTER — Ambulatory Visit (INDEPENDENT_AMBULATORY_CARE_PROVIDER_SITE_OTHER): Payer: Medicaid Other | Admitting: Cardiovascular Disease

## 2016-01-05 VITALS — BP 120/78 | HR 75 | Ht 70.0 in | Wt 254.2 lb

## 2016-01-05 DIAGNOSIS — I10 Essential (primary) hypertension: Secondary | ICD-10-CM

## 2016-01-05 DIAGNOSIS — E785 Hyperlipidemia, unspecified: Secondary | ICD-10-CM

## 2016-01-05 DIAGNOSIS — I739 Peripheral vascular disease, unspecified: Secondary | ICD-10-CM

## 2016-01-05 MED ORDER — AMLODIPINE BESYLATE 5 MG PO TABS: 5.0000 mg | ORAL_TABLET | Freq: Every day | ORAL | 3 refills | Status: DC

## 2016-01-05 MED ORDER — METOPROLOL TARTRATE 50 MG PO TABS: 25.0000 mg | ORAL_TABLET | Freq: Two times a day (BID) | ORAL | 3 refills | Status: DC
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
## 2016-01-05 NOTE — Patient Instructions (Signed)
Medication Instructions:  Your physician recommends that you continue on your current medications as directed. Please refer to the Current Medication list given to you today.  Labwork: No new orders.   Testing/Procedures: No new orders.   Follow-Up: Your physician wants you to follow-up in: 6 MONTHS with Dr Arida.  You will receive a reminder letter in the mail two months in advance. If you don't receive a letter, please call our office to schedule the follow-up appointment.   Any Other Special Instructions Will Be Listed Below (If Applicable).     If you need a refill on your cardiac medications before your next appointment, please call your pharmacy.   

## 2016-01-08 ENCOUNTER — Encounter (HOSPITAL_COMMUNITY)
Admission: RE | Admit: 2016-01-08 | Discharge: 2016-01-08 | Disposition: A | Discharge status: Home / Self Care | Payer: Medicaid Other | Source: Ambulatory Visit | Attending: General Surgery | Admitting: General Surgery

## 2016-01-08 ENCOUNTER — Encounter (HOSPITAL_COMMUNITY): Payer: Self-pay

## 2016-01-08 ENCOUNTER — Other Ambulatory Visit: Payer: Self-pay | Admitting: General Surgery

## 2016-01-08 DIAGNOSIS — Z01812 Encounter for preprocedural laboratory examination: Secondary | ICD-10-CM | POA: Diagnosis present

## 2016-01-08 LAB — CBC
HCT: 41.2 % (ref 39.0–52.0)
Hemoglobin: 12.6 g/dL — ABNORMAL LOW (ref 13.0–17.0)
MCH: 24.9 pg — ABNORMAL LOW (ref 26.0–34.0)
MCHC: 30.6 g/dL (ref 30.0–36.0)
MCV: 81.3 fL (ref 78.0–100.0)
Platelets: 224 10*3/uL (ref 150–400)
RBC: 5.07 MIL/uL (ref 4.22–5.81)
RDW: 14.1 % (ref 11.5–15.5)
WBC: 7.3 10*3/uL (ref 4.0–10.5)

## 2016-01-08 LAB — BASIC METABOLIC PANEL
Anion gap: 6 (ref 5–15)
BUN: 8 mg/dL (ref 6–20)
CO2: 28 mmol/L (ref 22–32)
Calcium: 9.1 mg/dL (ref 8.9–10.3)
Chloride: 107 mmol/L (ref 101–111)
Creatinine, Ser: 0.98 mg/dL (ref 0.61–1.24)
GFR calc Af Amer: >60 mL/min (ref >60)
GFR calc non Af Amer: >60 mL/min (ref >60)
Glucose, Bld: 106 mg/dL — ABNORMAL HIGH (ref 65–99)
Potassium: 4.2 mmol/L (ref 3.5–5.1)
Sodium: 141 mmol/L (ref 135–145)

## 2016-01-08 NOTE — Pre-Procedure Instructions (Signed)
EKG 06-18-15 epic HgbA1C 12-24-15 epic

## 2016-01-08 NOTE — Patient Instructions (Addendum)
Jeff [name]  01/08/2016   Your procedure is scheduled on: 01/14/16  Report to [facility]  Entrance take [facility]  elevators to 3rd floor to  [place] at 10:05AM.  Call this number if you have problems the morning of surgery [phone]   Remember: ONLY 1 PERSON MAY GO WITH YOU TO SHORT STAY TO GET  READY MORNING OF [place].    Follow bowel prep instructions from Dr. Manon Hilding Office.  Do not eat food or drink liquids :After Midnight.     Take these medicines the morning of surgery with A SIP OF WATER: Amlodipine (Norvasc), Levothyroxine (Synthroid), Metoprolol, Pantoprazole (Protonix), Venlafaxine XR (Effexor)                               You may not have any metal on your body including hair pins and              piercings  Do not wear jewelry, make-up, lotions, powders or perfumes, deodorant             Do not wear nail polish.  Do not shave  48 hours prior to surgery.              Men may shave face and neck.   Do not bring valuables to the hospital. [place].  Contacts, dentures or bridgework may not be worn into surgery.  Leave suitcase in the car. After surgery it may be brought to your room.               Please read over the following fact sheets you were given: _____________________________________________________________________             [facility] - Preparing for Surgery Before surgery, you can play an important role.  Because skin is not sterile, your skin needs to be as free of germs as possible.  You can reduce the number of germs on your skin by washing with CHG (chlorahexidine gluconate) soap before surgery.  CHG is an antiseptic cleaner which kills germs and bonds with the skin to continue killing germs even after washing. Please DO NOT use if you have an allergy to CHG or antibacterial soaps.  If your skin becomes reddened/irritated stop using the CHG and inform your  nurse when you arrive at Short Stay. Do not shave (including legs and underarms) for at least 48 hours prior to the first CHG shower.  You may shave your face/neck. Please follow these instructions carefully:  1.  Shower with CHG Soap the night before surgery and the  morning of Surgery.  2.  If you choose to wash your hair, wash your hair first as usual with your  normal  shampoo.  3.  After you shampoo, rinse your hair and body thoroughly to remove the  shampoo.                           4.  Use CHG as you would any other liquid soap.  You can apply chg directly  to the skin and wash  Gently with a scrungie or clean washcloth.  5.  Apply the CHG Soap to your body ONLY FROM THE NECK DOWN.   Do not use on face/ open                           Wound or open sores. Avoid contact with eyes, ears mouth and genitals (private parts).                       Wash face,  Genitals (private parts) with your normal soap.             6.  Wash thoroughly, paying special attention to the area where your surgery  will be performed.  7.  Thoroughly rinse your body with warm water from the neck down.  8.  DO NOT shower/wash with your normal soap after using and rinsing off  the CHG Soap.                9.  Pat yourself dry with a clean towel.            10.  Wear clean pajamas.            11.  Place clean sheets on your bed the night of your first shower and do not  sleep with pets. Day of Surgery : Do not apply any lotions/deodorants the morning of surgery.  Please wear clean clothes to the hospital/surgery center.  FAILURE TO FOLLOW THESE INSTRUCTIONS MAY RESULT IN THE CANCELLATION OF YOUR SURGERY PATIENT SIGNATURE_________________________________  NURSE SIGNATURE__________________________________  ________________________________________________________________________  WHAT IS A BLOOD TRANSFUSION? Blood Transfusion Information  A transfusion is the replacement of blood or some of its  parts. Blood is made up of multiple cells which provide different functions.  Red blood cells carry oxygen and are used for blood loss replacement.  White blood cells fight against infection.  Platelets control bleeding.  Plasma helps clot blood.  Other blood products are available for specialized needs, such as hemophilia or other clotting disorders. BEFORE THE TRANSFUSION  Who gives blood for transfusions?   Healthy volunteers who are fully evaluated to make sure their blood is safe. This is blood bank blood. Transfusion therapy is the safest it has ever been in the practice of medicine. Before blood is taken from a donor, a complete history is taken to make sure that person has no history of diseases nor engages in risky social behavior (examples are intravenous drug use or sexual activity with multiple partners). The donor's travel history is screened to minimize risk of transmitting infections, such as malaria. The donated blood is tested for signs of infectious diseases, such as HIV and hepatitis. The blood is then tested to be sure it is compatible with you in order to minimize the chance of a transfusion reaction. If you or a relative donates blood, this is often done in anticipation of surgery and is not appropriate for emergency situations. It takes many days to process the donated blood. RISKS AND COMPLICATIONS Although transfusion therapy is very safe and saves many lives, the main dangers of transfusion include:   Getting an infectious disease.  Developing a transfusion reaction. This is an allergic reaction to something in the blood you were given. Every precaution is taken to prevent this. The decision to have a blood transfusion has been considered carefully by your caregiver before blood is given. Blood is not given unless the benefits outweigh  the risks. AFTER THE TRANSFUSION  Right after receiving a blood transfusion, you will usually feel much better and more energetic.  This is especially true if your red blood cells have gotten low (anemic). The transfusion raises the level of the red blood cells which carry oxygen, and this usually causes an energy increase.  The nurse administering the transfusion will monitor you carefully for complications. HOME CARE INSTRUCTIONS  No special instructions are needed after a transfusion. You may find your energy is better. Speak with your caregiver about any limitations on activity for underlying diseases you may have. SEEK MEDICAL CARE IF:   Your condition is not improving after your transfusion.  You develop redness or irritation at the intravenous (IV) site. SEEK IMMEDIATE MEDICAL CARE IF:  Any of the following symptoms occur over the next 12 hours:  Shaking chills.  You have a temperature by mouth above 102 F (38.9 C), not controlled by medicine.  Chest, back, or muscle pain.  People around you feel you are not acting correctly or are confused.  Shortness of breath or difficulty breathing.  Dizziness and fainting.  You get a rash or develop hives.  You have a decrease in urine output.  Your urine turns a dark color or changes to pink, red, or brown. Any of the following symptoms occur over the next 10 days:  You have a temperature by mouth above 102 F (38.9 C), not controlled by medicine.  Shortness of breath.  Weakness after normal activity.  The white part of the eye turns yellow (jaundice).  You have a decrease in the amount of urine or are urinating less often.  Your urine turns a dark color or changes to pink, red, or brown. Document Released: 04/29/2000 Document Revised: 07/25/2011 Document Reviewed: 12/17/2007 [facility] Patient Information 2014 [place], [place].  _______________________________________________________________________

## 2016-01-13 ENCOUNTER — Other Ambulatory Visit: Payer: Self-pay | Admitting: Endocrinology

## 2016-01-14 ENCOUNTER — Inpatient Hospital Stay (HOSPITAL_COMMUNITY)
Admission: RE | Admit: 2016-01-14 | Discharge: 2016-01-17 | DRG: 330 | Disposition: A | Discharge status: Home / Self Care | Payer: Medicaid Other | Source: Ambulatory Visit | Attending: General Surgery | Admitting: General Surgery

## 2016-01-14 ENCOUNTER — Inpatient Hospital Stay (HOSPITAL_COMMUNITY): Payer: Medicaid Other

## 2016-01-14 ENCOUNTER — Encounter (HOSPITAL_COMMUNITY): Payer: Self-pay | Admitting: *Deleted

## 2016-01-14 ENCOUNTER — Inpatient Hospital Stay (HOSPITAL_COMMUNITY): Payer: Medicaid Other | Admitting: Registered Nurse

## 2016-01-14 ENCOUNTER — Encounter (HOSPITAL_COMMUNITY)
Admission: RE | Disposition: A | Discharge status: Home / Self Care | Payer: Self-pay | Source: Ambulatory Visit | Attending: General Surgery

## 2016-01-14 DIAGNOSIS — M549 Dorsalgia, unspecified: Secondary | ICD-10-CM | POA: Diagnosis present

## 2016-01-14 DIAGNOSIS — I252 Old myocardial infarction: Secondary | ICD-10-CM | POA: Diagnosis not present

## 2016-01-14 DIAGNOSIS — Z885 Allergy status to narcotic agent status: Secondary | ICD-10-CM

## 2016-01-14 DIAGNOSIS — E039 Hypothyroidism, unspecified: Secondary | ICD-10-CM | POA: Diagnosis present

## 2016-01-14 DIAGNOSIS — Z452 Encounter for adjustment and management of vascular access device: Secondary | ICD-10-CM

## 2016-01-14 DIAGNOSIS — Z79899 Other long term (current) drug therapy: Secondary | ICD-10-CM | POA: Diagnosis not present

## 2016-01-14 DIAGNOSIS — Z8249 Family history of ischemic heart disease and other diseases of the circulatory system: Secondary | ICD-10-CM | POA: Diagnosis not present

## 2016-01-14 DIAGNOSIS — I739 Peripheral vascular disease, unspecified: Secondary | ICD-10-CM | POA: Diagnosis present

## 2016-01-14 DIAGNOSIS — I2510 Atherosclerotic heart disease of native coronary artery without angina pectoris: Secondary | ICD-10-CM | POA: Diagnosis present

## 2016-01-14 DIAGNOSIS — Z7982 Long term (current) use of aspirin: Secondary | ICD-10-CM | POA: Diagnosis not present

## 2016-01-14 DIAGNOSIS — Z823 Family history of stroke: Secondary | ICD-10-CM

## 2016-01-14 DIAGNOSIS — C787 Secondary malignant neoplasm of liver and intrahepatic bile duct: Secondary | ICD-10-CM | POA: Diagnosis present

## 2016-01-14 DIAGNOSIS — I10 Essential (primary) hypertension: Secondary | ICD-10-CM | POA: Diagnosis present

## 2016-01-14 DIAGNOSIS — G8929 Other chronic pain: Secondary | ICD-10-CM | POA: Diagnosis present

## 2016-01-14 DIAGNOSIS — F418 Other specified anxiety disorders: Secondary | ICD-10-CM | POA: Diagnosis present

## 2016-01-14 DIAGNOSIS — C187 Malignant neoplasm of sigmoid colon: Secondary | ICD-10-CM | POA: Diagnosis present

## 2016-01-14 DIAGNOSIS — C189 Malignant neoplasm of colon, unspecified: Secondary | ICD-10-CM | POA: Diagnosis present

## 2016-01-14 HISTORY — PX: LIVER BIOPSY: SHX301

## 2016-01-14 HISTORY — PX: PORTACATH PLACEMENT: SHX2246

## 2016-01-14 HISTORY — PX: LAPAROSCOPIC SIGMOID COLECTOMY: SHX5928

## 2016-01-14 LAB — TYPE AND SCREEN
ABO/RH(D): O POS
Antibody Screen: NEGATIVE

## 2016-01-14 SURGERY — COLECTOMY, SIGMOID, LAPAROSCOPIC
Anesthesia: General | Site: Chest | Laterality: Right

## 2016-01-14 MED ORDER — FENTANYL CITRATE (PF) 100 MCG/2ML IJ SOLN
INTRAMUSCULAR | Status: AC
  Filled 2016-01-14: qty 2

## 2016-01-14 MED ORDER — MORPHINE SULFATE (PF) 10 MG/ML IV SOLN: 2.0000 mg | INTRAVENOUS | Status: DC | PRN

## 2016-01-14 MED ORDER — VENLAFAXINE HCL ER 75 MG PO CP24
75.0000 mg | ORAL_CAPSULE | Freq: Every day | ORAL | Status: DC
  Administered 2016-01-15 – 2016-01-17 (×3): 75 mg via ORAL
  Filled 2016-01-14 (×4): qty 1

## 2016-01-14 MED ORDER — EPHEDRINE SULFATE 50 MG/ML IJ SOLN
INTRAMUSCULAR | Status: DC | PRN
  Administered 2016-01-14 (×2): 5 mg via INTRAVENOUS

## 2016-01-14 MED ORDER — LACTATED RINGERS IV SOLN
INTRAVENOUS | Status: DC
  Administered 2016-01-14 (×2): via INTRAVENOUS

## 2016-01-14 MED ORDER — HYDROMORPHONE HCL 1 MG/ML IJ SOLN
INTRAMUSCULAR | Status: AC
  Administered 2016-01-14: 0.5 mg via INTRAVENOUS
  Filled 2016-01-14: qty 1

## 2016-01-14 MED ORDER — ONDANSETRON HCL 4 MG/2ML IJ SOLN
INTRAMUSCULAR | Status: AC
  Filled 2016-01-14: qty 2

## 2016-01-14 MED ORDER — ZOLPIDEM TARTRATE 5 MG PO TABS
5.0000 mg | ORAL_TABLET | Freq: Every evening | ORAL | Status: DC | PRN
  Administered 2016-01-15: 5 mg via ORAL
  Filled 2016-01-14: qty 1

## 2016-01-14 MED ORDER — ACETAMINOPHEN 500 MG PO TABS
1000.0000 mg | ORAL_TABLET | Freq: Four times a day (QID) | ORAL | Status: AC
  Administered 2016-01-15 (×2): 1000 mg via ORAL
  Filled 2016-01-14 (×3): qty 2

## 2016-01-14 MED ORDER — HEPARIN SOD (PORK) LOCK FLUSH 100 UNIT/ML IV SOLN
INTRAVENOUS | Status: DC | PRN
  Administered 2016-01-14: 500 [IU] via INTRAVENOUS

## 2016-01-14 MED ORDER — CEFOTETAN DISODIUM-DEXTROSE 2-2.08 GM-% IV SOLR
INTRAVENOUS | Status: AC
  Filled 2016-01-14: qty 50

## 2016-01-14 MED ORDER — CHLORHEXIDINE GLUCONATE CLOTH 2 % EX PADS: 6.0000 | MEDICATED_PAD | Freq: Once | CUTANEOUS | Status: DC

## 2016-01-14 MED ORDER — METOPROLOL TARTRATE 25 MG PO TABS
25.0000 mg | ORAL_TABLET | Freq: Two times a day (BID) | ORAL | Status: DC
  Administered 2016-01-14 – 2016-01-17 (×6): 25 mg via ORAL
  Filled 2016-01-14 (×6): qty 1

## 2016-01-14 MED ORDER — DEXAMETHASONE SODIUM PHOSPHATE 10 MG/ML IJ SOLN
INTRAMUSCULAR | Status: DC | PRN
  Administered 2016-01-14: 10 mg via INTRAVENOUS

## 2016-01-14 MED ORDER — ACETAMINOPHEN 10 MG/ML IV SOLN
1000.0000 mg | Freq: Once | INTRAVENOUS | Status: AC
  Administered 2016-01-14: 1000 mg via INTRAVENOUS

## 2016-01-14 MED ORDER — SUGAMMADEX SODIUM 200 MG/2ML IV SOLN
INTRAVENOUS | Status: AC
  Filled 2016-01-14: qty 2

## 2016-01-14 MED ORDER — PANTOPRAZOLE SODIUM 40 MG PO TBEC
40.0000 mg | DELAYED_RELEASE_TABLET | Freq: Every day | ORAL | Status: DC
  Administered 2016-01-15 – 2016-01-16 (×2): 40 mg via ORAL
  Filled 2016-01-14 (×2): qty 1

## 2016-01-14 MED ORDER — SUGAMMADEX SODIUM 200 MG/2ML IV SOLN
INTRAVENOUS | Status: DC | PRN
  Administered 2016-01-14: 200 mg via INTRAVENOUS

## 2016-01-14 MED ORDER — PROMETHAZINE HCL 25 MG/ML IJ SOLN: 6.2500 mg | INTRAMUSCULAR | Status: DC | PRN

## 2016-01-14 MED ORDER — DEXTROSE 5 % IV SOLN
2.0000 g | INTRAVENOUS | Status: AC
  Administered 2016-01-14: 2 g via INTRAVENOUS
  Filled 2016-01-14: qty 2

## 2016-01-14 MED ORDER — ALVIMOPAN 12 MG PO CAPS
12.0000 mg | ORAL_CAPSULE | Freq: Two times a day (BID) | ORAL | Status: DC
  Administered 2016-01-15 – 2016-01-16 (×3): 12 mg via ORAL
  Filled 2016-01-14 (×4): qty 1

## 2016-01-14 MED ORDER — ROCURONIUM BROMIDE 100 MG/10ML IV SOLN
INTRAVENOUS | Status: AC
  Filled 2016-01-14: qty 1

## 2016-01-14 MED ORDER — ENOXAPARIN SODIUM 40 MG/0.4ML ~~LOC~~ SOLN
40.0000 mg | SUBCUTANEOUS | Status: DC
  Administered 2016-01-15 – 2016-01-17 (×3): 40 mg via SUBCUTANEOUS
  Filled 2016-01-14 (×3): qty 0.4

## 2016-01-14 MED ORDER — EPHEDRINE 5 MG/ML INJ
INTRAVENOUS | Status: AC
  Filled 2016-01-14: qty 10

## 2016-01-14 MED ORDER — FENTANYL CITRATE (PF) 100 MCG/2ML IJ SOLN
INTRAMUSCULAR | Status: DC | PRN
  Administered 2016-01-14 (×5): 50 ug via INTRAVENOUS

## 2016-01-14 MED ORDER — ONDANSETRON HCL 4 MG PO TABS: 4.0000 mg | ORAL_TABLET | Freq: Four times a day (QID) | ORAL | Status: DC | PRN

## 2016-01-14 MED ORDER — HEPARIN SODIUM (PORCINE) 5000 UNIT/ML IJ SOLN
INTRAMUSCULAR | Status: DC | PRN
  Administered 2016-01-14: 5000 [IU] via SUBCUTANEOUS

## 2016-01-14 MED ORDER — HEPARIN SODIUM (PORCINE) 5000 UNIT/ML IJ SOLN
Freq: Once | INTRAMUSCULAR | Status: AC
  Administered 2016-01-14: 500 mL
  Filled 2016-01-14: qty 1.2

## 2016-01-14 MED ORDER — DIPHENHYDRAMINE HCL 25 MG PO CAPS: 25.0000 mg | ORAL_CAPSULE | Freq: Four times a day (QID) | ORAL | Status: DC | PRN

## 2016-01-14 MED ORDER — HEPARIN SODIUM (PORCINE) 5000 UNIT/ML IJ SOLN
INTRAMUSCULAR | Status: AC
  Filled 2016-01-14: qty 1

## 2016-01-14 MED ORDER — ACETAMINOPHEN 10 MG/ML IV SOLN
INTRAVENOUS | Status: AC
  Administered 2016-01-14: 1000 mg via INTRAVENOUS
  Filled 2016-01-14: qty 100

## 2016-01-14 MED ORDER — CEFOTETAN DISODIUM 2 G IJ SOLR
2.0000 g | Freq: Two times a day (BID) | INTRAMUSCULAR | Status: AC
  Administered 2016-01-14: 2 g via INTRAVENOUS
  Filled 2016-01-14 (×2): qty 2

## 2016-01-14 MED ORDER — ATORVASTATIN CALCIUM 40 MG PO TABS
40.0000 mg | ORAL_TABLET | Freq: Every evening | ORAL | Status: DC
  Administered 2016-01-14 – 2016-01-16 (×3): 40 mg via ORAL
  Filled 2016-01-14 (×3): qty 1

## 2016-01-14 MED ORDER — BUPIVACAINE-EPINEPHRINE (PF) 0.25% -1:200000 IJ SOLN
INTRAMUSCULAR | Status: AC
  Filled 2016-01-14: qty 30

## 2016-01-14 MED ORDER — MIDAZOLAM HCL 2 MG/2ML IJ SOLN
INTRAMUSCULAR | Status: AC
  Filled 2016-01-14: qty 2

## 2016-01-14 MED ORDER — MIDAZOLAM HCL 5 MG/5ML IJ SOLN
INTRAMUSCULAR | Status: DC | PRN
  Administered 2016-01-14: 2 mg via INTRAVENOUS

## 2016-01-14 MED ORDER — PROPOFOL 10 MG/ML IV BOLUS
INTRAVENOUS | Status: AC
  Filled 2016-01-14: qty 40

## 2016-01-14 MED ORDER — PHENYLEPHRINE 40 MCG/ML (10ML) SYRINGE FOR IV PUSH (FOR BLOOD PRESSURE SUPPORT)
PREFILLED_SYRINGE | INTRAVENOUS | Status: AC
  Filled 2016-01-14: qty 10

## 2016-01-14 MED ORDER — MORPHINE SULFATE (PF) 2 MG/ML IV SOLN
2.0000 mg | INTRAVENOUS | Status: DC | PRN
  Administered 2016-01-14: 2 mg via INTRAVENOUS
  Administered 2016-01-14: 4 mg via INTRAVENOUS
  Administered 2016-01-14: 2 mg via INTRAVENOUS
  Administered 2016-01-15 – 2016-01-16 (×8): 4 mg via INTRAVENOUS
  Filled 2016-01-14 (×2): qty 2
  Filled 2016-01-14 (×2): qty 1
  Filled 2016-01-14 (×6): qty 2

## 2016-01-14 MED ORDER — PROPOFOL 10 MG/ML IV BOLUS
INTRAVENOUS | Status: DC | PRN
  Administered 2016-01-14: 300 mg via INTRAVENOUS

## 2016-01-14 MED ORDER — PHENYLEPHRINE 40 MCG/ML (10ML) SYRINGE FOR IV PUSH (FOR BLOOD PRESSURE SUPPORT)
PREFILLED_SYRINGE | INTRAVENOUS | Status: DC | PRN
  Administered 2016-01-14 (×5): 80 ug via INTRAVENOUS

## 2016-01-14 MED ORDER — LIDOCAINE 2% (20 MG/ML) 5 ML SYRINGE
INTRAMUSCULAR | Status: AC
  Filled 2016-01-14: qty 5

## 2016-01-14 MED ORDER — SUCCINYLCHOLINE CHLORIDE 20 MG/ML IJ SOLN
INTRAMUSCULAR | Status: DC | PRN
  Administered 2016-01-14: 100 mg via INTRAVENOUS

## 2016-01-14 MED ORDER — FENTANYL CITRATE (PF) 100 MCG/2ML IJ SOLN
25.0000 ug | INTRAMUSCULAR | Status: DC | PRN
  Administered 2016-01-14 (×4): 25 ug via INTRAVENOUS

## 2016-01-14 MED ORDER — ALVIMOPAN 12 MG PO CAPS
12.0000 mg | ORAL_CAPSULE | Freq: Once | ORAL | Status: AC
  Administered 2016-01-14: 12 mg via ORAL
  Filled 2016-01-14: qty 1

## 2016-01-14 MED ORDER — LEVOTHYROXINE SODIUM 112 MCG PO TABS
112.0000 ug | ORAL_TABLET | Freq: Every day | ORAL | Status: DC
  Administered 2016-01-15 – 2016-01-17 (×3): 112 ug via ORAL
  Filled 2016-01-14 (×3): qty 1

## 2016-01-14 MED ORDER — ROCURONIUM BROMIDE 100 MG/10ML IV SOLN
INTRAVENOUS | Status: DC | PRN
  Administered 2016-01-14: 10 mg via INTRAVENOUS
  Administered 2016-01-14: 50 mg via INTRAVENOUS
  Administered 2016-01-14 (×2): 10 mg via INTRAVENOUS

## 2016-01-14 MED ORDER — LIDOCAINE HCL (CARDIAC) 20 MG/ML IV SOLN
INTRAVENOUS | Status: DC | PRN
  Administered 2016-01-14: 100 mg via INTRAVENOUS

## 2016-01-14 MED ORDER — KCL IN DEXTROSE-NACL 20-5-0.45 MEQ/L-%-% IV SOLN
INTRAVENOUS | Status: DC
  Administered 2016-01-14: 19:00:00 via INTRAVENOUS
  Administered 2016-01-15: 1000 mL via INTRAVENOUS
  Filled 2016-01-14 (×3): qty 1000

## 2016-01-14 MED ORDER — FENTANYL CITRATE (PF) 100 MCG/2ML IJ SOLN
INTRAMUSCULAR | Status: AC
  Administered 2016-01-14: 25 ug via INTRAVENOUS
  Filled 2016-01-14: qty 2

## 2016-01-14 MED ORDER — ONDANSETRON HCL 4 MG/2ML IJ SOLN
INTRAMUSCULAR | Status: DC | PRN
  Administered 2016-01-14: 4 mg via INTRAVENOUS

## 2016-01-14 MED ORDER — LIP MEDEX EX OINT
TOPICAL_OINTMENT | CUTANEOUS | Status: AC
  Filled 2016-01-14: qty 7

## 2016-01-14 MED ORDER — DIPHENHYDRAMINE HCL 50 MG/ML IJ SOLN: 25.0000 mg | Freq: Four times a day (QID) | INTRAMUSCULAR | Status: DC | PRN

## 2016-01-14 MED ORDER — AMLODIPINE BESYLATE 5 MG PO TABS
5.0000 mg | ORAL_TABLET | Freq: Every day | ORAL | Status: DC
  Administered 2016-01-15 – 2016-01-17 (×3): 5 mg via ORAL
  Filled 2016-01-14 (×3): qty 1

## 2016-01-14 MED ORDER — TAMSULOSIN HCL 0.4 MG PO CAPS
0.4000 mg | ORAL_CAPSULE | Freq: Every evening | ORAL | Status: DC
  Administered 2016-01-14 – 2016-01-16 (×3): 0.4 mg via ORAL
  Filled 2016-01-14 (×3): qty 1

## 2016-01-14 MED ORDER — 0.9 % SODIUM CHLORIDE (POUR BTL) OPTIME
TOPICAL | Status: DC | PRN
  Administered 2016-01-14: 2000 mL
  Administered 2016-01-14: 1000 mL

## 2016-01-14 MED ORDER — TIZANIDINE HCL 4 MG PO TABS
4.0000 mg | ORAL_TABLET | Freq: Two times a day (BID) | ORAL | Status: DC | PRN
Start: 2016-01-14 — End: 2016-01-17
  Filled 2016-01-14: qty 1

## 2016-01-14 MED ORDER — BUPIVACAINE-EPINEPHRINE 0.25% -1:200000 IJ SOLN
INTRAMUSCULAR | Status: DC | PRN
  Administered 2016-01-14: 30 mL

## 2016-01-14 MED ORDER — HEPARIN SOD (PORK) LOCK FLUSH 100 UNIT/ML IV SOLN
INTRAVENOUS | Status: AC
  Filled 2016-01-14: qty 5

## 2016-01-14 MED ORDER — LACTATED RINGERS IR SOLN
Status: DC | PRN
  Administered 2016-01-14: 1000 mL

## 2016-01-14 MED ORDER — ONDANSETRON HCL 4 MG/2ML IJ SOLN: 4.0000 mg | Freq: Four times a day (QID) | INTRAMUSCULAR | Status: DC | PRN

## 2016-01-14 MED ORDER — FENTANYL CITRATE (PF) 250 MCG/5ML IJ SOLN
INTRAMUSCULAR | Status: AC
  Filled 2016-01-14: qty 5

## 2016-01-14 MED ORDER — HYDROMORPHONE HCL 1 MG/ML IJ SOLN
0.2500 mg | INTRAMUSCULAR | Status: DC | PRN
  Administered 2016-01-14 (×4): 0.5 mg via INTRAVENOUS

## 2016-01-14 SURGICAL SUPPLY — 96 items
APPLIER CLIP 5 13 M/L LIGAMAX5 (MISCELLANEOUS)
APR CLP MED LRG 5 ANG JAW (MISCELLANEOUS)
BAG DECANTER FOR FLEXI CONT (MISCELLANEOUS) ×4 IMPLANT
BLADE EXTENDED COATED 6.5IN (ELECTRODE) IMPLANT
BLADE SURG SZ11 CARB STEEL (BLADE) ×4 IMPLANT
CABLE HIGH FREQUENCY MONO STRZ (ELECTRODE) ×4 IMPLANT
CELLS DAT CNTRL 66122 CELL SVR (MISCELLANEOUS) IMPLANT
CHLORAPREP W/TINT 26ML (MISCELLANEOUS) ×4 IMPLANT
CLIP APPLIE 5 13 M/L LIGAMAX5 (MISCELLANEOUS) IMPLANT
COUNTER NEEDLE 20 DBL MAG RED (NEEDLE) ×4 IMPLANT
COVER MAYO STAND STRL (DRAPES) ×8 IMPLANT
COVER PROBE U/S 5X48 (MISCELLANEOUS) ×2 IMPLANT
COVER SURGICAL LIGHT HANDLE (MISCELLANEOUS) ×8 IMPLANT
DECANTER SPIKE VIAL GLASS SM (MISCELLANEOUS) ×4 IMPLANT
DRAIN CHANNEL 19F RND (DRAIN) IMPLANT
DRAPE C-ARM 42X120 X-RAY (DRAPES) ×4 IMPLANT
DRAPE LAPAROSCOPIC ABDOMINAL (DRAPES) ×4 IMPLANT
DRAPE LAPAROTOMY TRNSV 102X78 (DRAPE) ×4 IMPLANT
DRSG OPSITE POSTOP 4X10 (GAUZE/BANDAGES/DRESSINGS) IMPLANT
DRSG OPSITE POSTOP 4X6 (GAUZE/BANDAGES/DRESSINGS) IMPLANT
DRSG OPSITE POSTOP 4X8 (GAUZE/BANDAGES/DRESSINGS) IMPLANT
DRSG TEGADERM 4X4.75 (GAUZE/BANDAGES/DRESSINGS) ×2 IMPLANT
DRSG TELFA 3X8 NADH (GAUZE/BANDAGES/DRESSINGS) ×4 IMPLANT
ELECT PENCIL ROCKER SW 15FT (MISCELLANEOUS) ×10 IMPLANT
ELECT REM PT RETURN 15FT ADLT (MISCELLANEOUS) ×4 IMPLANT
ELECT REM PT RETURN 9FT ADLT (ELECTROSURGICAL) ×4
ELECTRODE REM PT RTRN 9FT ADLT (ELECTROSURGICAL) ×2 IMPLANT
EVACUATOR SILICONE 100CC (DRAIN) IMPLANT
GAUZE SPONGE 2X2 8PLY STRL LF (GAUZE/BANDAGES/DRESSINGS) IMPLANT
GAUZE SPONGE 4X4 12PLY STRL (GAUZE/BANDAGES/DRESSINGS) IMPLANT
GAUZE SPONGE 4X4 16PLY XRAY LF (GAUZE/BANDAGES/DRESSINGS) ×4 IMPLANT
GLOVE BIO SURGEON STRL SZ 6.5 (GLOVE) ×6 IMPLANT
GLOVE BIO SURGEONS STRL SZ 6.5 (GLOVE) ×2
GLOVE BIOGEL PI IND STRL 7.0 (GLOVE) ×4 IMPLANT
GLOVE BIOGEL PI INDICATOR 7.0 (GLOVE) ×4
GOWN STRL REUS W/TWL 2XL LVL3 (GOWN DISPOSABLE) ×8 IMPLANT
GOWN STRL REUS W/TWL XL LVL3 (GOWN DISPOSABLE) ×16 IMPLANT
GRASPER ENDOPATH ANVIL 10MM (MISCELLANEOUS) ×2 IMPLANT
HANDLE STAPLE EGIA 4 XL (STAPLE) ×2 IMPLANT
IRRIG SUCT STRYKERFLOW 2 WTIP (MISCELLANEOUS) ×4
IRRIGATION SUCT STRKRFLW 2 WTP (MISCELLANEOUS) ×2 IMPLANT
KIT BASIN OR (CUSTOM PROCEDURE TRAY) ×4 IMPLANT
KIT PORT POWER 8FR ISP CVUE (Catheter) ×2 IMPLANT
LEGGING LITHOTOMY PAIR STRL (DRAPES) ×4 IMPLANT
LIGASURE IMPACT 36 18CM CVD LR (INSTRUMENTS) IMPLANT
LIQUID BAND (GAUZE/BANDAGES/DRESSINGS) ×4 IMPLANT
LUBRICANT JELLY K Y 4OZ (MISCELLANEOUS) ×4 IMPLANT
NDL HYPO 25X1 1.5 SAFETY (NEEDLE) ×2 IMPLANT
NEEDLE HYPO 25X1 1.5 SAFETY (NEEDLE) ×4 IMPLANT
PACK BASIC VI WITH GOWN DISP (CUSTOM PROCEDURE TRAY) ×4 IMPLANT
PACK COLON (CUSTOM PROCEDURE TRAY) ×4 IMPLANT
PAD DRESSING TELFA 3X8 NADH (GAUZE/BANDAGES/DRESSINGS) IMPLANT
PAD POSITIONING PINK XL (MISCELLANEOUS) ×4 IMPLANT
PORT LAP GEL ALEXIS MED 5-9CM (MISCELLANEOUS) ×2 IMPLANT
POSITIONER SURGICAL ARM (MISCELLANEOUS) ×4 IMPLANT
RELOAD EGIA 60 MED/THCK PURPLE (STAPLE) ×4 IMPLANT
RELOAD STAPLE 60 MED/THCK ART (STAPLE) IMPLANT
RETRACTOR WND ALEXIS 18 MED (MISCELLANEOUS) IMPLANT
RTRCTR WOUND ALEXIS 18CM MED (MISCELLANEOUS)
SCISSORS LAP 5X35 DISP (ENDOMECHANICALS) ×4 IMPLANT
SEALER TISSUE G2 CVD JAW 35 (ENDOMECHANICALS) IMPLANT
SEALER TISSUE G2 CVD JAW 45CM (ENDOMECHANICALS)
SEALER TISSUE G2 STRG ARTC 35C (ENDOMECHANICALS) ×2 IMPLANT
SLEEVE XCEL OPT CAN 5 100 (ENDOMECHANICALS) ×4 IMPLANT
SPONGE GAUZE 2X2 STER 10/PKG (GAUZE/BANDAGES/DRESSINGS) ×2
STAPLER VISISTAT 35W (STAPLE) ×4 IMPLANT
SUT ETHILON 2 0 PS N (SUTURE) IMPLANT
SUT NOVA NAB DX-16 0-1 5-0 T12 (SUTURE) ×8 IMPLANT
SUT PDS AB 1 CTX 36 (SUTURE) IMPLANT
SUT PDS AB 1 TP1 96 (SUTURE) IMPLANT
SUT PROLENE 2 0 KS (SUTURE) ×4 IMPLANT
SUT PROLENE 2 0 SH DA (SUTURE) ×4 IMPLANT
SUT SILK 2 0 (SUTURE) ×4
SUT SILK 2 0 SH CR/8 (SUTURE) ×4 IMPLANT
SUT SILK 2-0 18XBRD TIE 12 (SUTURE) ×2 IMPLANT
SUT SILK 3 0 (SUTURE) ×4
SUT SILK 3 0 SH CR/8 (SUTURE) ×4 IMPLANT
SUT SILK 3-0 18XBRD TIE 12 (SUTURE) ×2 IMPLANT
SUT VIC AB 2-0 SH 18 (SUTURE) ×4 IMPLANT
SUT VIC AB 3-0 SH 27 (SUTURE) ×4
SUT VIC AB 3-0 SH 27XBRD (SUTURE) ×2 IMPLANT
SUT VIC AB 4-0 PS2 18 (SUTURE) ×4 IMPLANT
SUT VIC AB 4-0 PS2 27 (SUTURE) ×4 IMPLANT
SYR CONTROL 10ML LL (SYRINGE) ×4 IMPLANT
SYRINGE 10CC LL (SYRINGE) ×4 IMPLANT
SYS LAPSCP GELPORT 120MM (MISCELLANEOUS)
SYSTEM LAPSCP GELPORT 120MM (MISCELLANEOUS) IMPLANT
TOWEL OR 17X26 10 PK STRL BLUE (TOWEL DISPOSABLE) ×4 IMPLANT
TOWEL OR NON WOVEN STRL DISP B (DISPOSABLE) ×4 IMPLANT
TRAY FOLEY W/METER SILVER 14FR (SET/KITS/TRAYS/PACK) IMPLANT
TRAY FOLEY W/METER SILVER 16FR (SET/KITS/TRAYS/PACK) ×2 IMPLANT
TROCAR BLADELESS OPT 5 100 (ENDOMECHANICALS) ×4 IMPLANT
TROCAR XCEL BLUNT TIP 100MML (ENDOMECHANICALS) ×4 IMPLANT
TUBING CONNECTING 10 (TUBING) ×5 IMPLANT
TUBING CONNECTING 10' (TUBING) ×3
TUBING INSUF HEATED (TUBING) ×4 IMPLANT

## 2016-01-14 NOTE — Anesthesia Procedure Notes (Addendum)
Procedure Name: Intubation Date/Time: 01/14/2016 12:29 PM Performed by: Carleene Cooper A Pre-anesthesia Checklist: Patient identified, Timeout performed, Emergency Drugs available, Suction available and Patient being monitored Patient Re-evaluated:Patient Re-evaluated prior to inductionOxygen Delivery Method: Circle system utilized Preoxygenation: Pre-oxygenation with 100% oxygen Intubation Type: IV induction Ventilation: Mask ventilation without difficulty Laryngoscope Size: Mac and 4 Grade View: Grade II Tube type: Oral Tube size: 7.5 mm Number of attempts: 2 Airway Equipment and Method: Stylet Placement Confirmation: ETT inserted through vocal cords under direct vision,  positive ETCO2 and breath sounds checked- equal and bilateral Secured at: 23 cm Tube secured with: Tape Dental Injury: Teeth and Oropharynx as per pre-operative assessment  Comments: Smooth IV induction. Easy mask, DL X 1 by CRNA with MAC 4. Grade 2-3 view. - ETCO2. ETT removed. SAts 96-99%. Masked. DL X1 with MAC 4 by Dr. Cheral Bay. GRade 2b view. ATOI. ETT secured. BBS =.

## 2016-01-14 NOTE — Anesthesia Postprocedure Evaluation (Signed)
Anesthesia Post Note  Patient: Jeff [name]  Procedure(s) Performed: Procedure(s) (LRB): LAPAROSCOPIC SIGMOID COLECTOMY (N/A) LIVER BIOPSY (N/A) INSERTION PORT-A-CATH (Right)  Patient location during evaluation: PACU Anesthesia Type: General Level of consciousness: awake and alert Pain management: pain level controlled Vital Signs Assessment: post-procedure vital signs reviewed and stable Respiratory status: spontaneous breathing, nonlabored ventilation and respiratory function stable Cardiovascular status: blood pressure returned to baseline and stable Postop Assessment: no signs of nausea or vomiting Anesthetic complications: no    Last Vitals:  Vitals:   01/14/16 1756 01/14/16 1810  BP:  (!) 145/49  Pulse: (!) 110 (!) 105  Resp: (!) 22 16  Temp:  36.4 C    Last Pain:  Vitals:   01/14/16 1745  TempSrc:   PainSc: 5                  Nilda Simmer

## 2016-01-14 NOTE — H&P (View-Only) (Signed)
History of Present Illness Jeff Ruff MD; [id number] 10:11 AM) The patient is a 50 year old male who presents with colorectal cancer. 50 year old male who presents to the office for evaluation of a newly diagnosed colon cancer. He states that he has had blood with bowel movements for approximately 6 months. He delayed his colonoscopy 3 months due to having surgery on his leg. He underwent a tibioperoneal trunk bypass graft for left leg claudication. The patient states he has constipation at baseline. He was able to complete his bowel prep without difficulty. He continues to have hard stool but has not noticed much blood. He does have some abdominal cramping prior to bowel movements. He does not take any stool softeners at this time. He has a history of a non-ST segment elevation MI in 2016, he takes a baby aspirin for this. He also has hypothyroidism following radioiodine therapy for multinodular goiter. The patient denies any weight loss.   Other Problems [name], CMA; 12/15/2015 9:33 AM) Back Pain Colon Cancer Gastroesophageal Reflux Disease High blood pressure Hypercholesterolemia Kidney Stone Myocardial infarction Thyroid Disease  Past Surgical History ([place]; 12/15/2015 9:33 AM) Shoulder Surgery Right. Spinal Surgery - Lower Back Spinal Surgery - Neck  Diagnostic Studies History [name], CMA; 12/15/2015 9:33 AM) Colonoscopy within last year  Allergies [name], CMA; 12/15/2015 9:34 AM) Vicodin *ANALGESICS - OPIOID*  Medication History ([name], CMA; 12/15/2015 9:38 AM) AmLODIPine Besylate (5MG  Tablet, Oral) Active. Atorvastatin Calcium (40MG  Tablet, Oral) Active. DiazePAM (10MG  Tablet, Oral) Active. Pantoprazole Sodium (40MG  Tablet DR, Oral) Active. Tamsulosin HCl (0.4MG  Capsule, Oral) Active. Omeprazole (40MG  Capsule DR, Oral) Active. Levothyroxine Sodium (112MCG Tablet, Oral) Active. Escitalopram Oxalate (20MG  Tablet,  Oral) Active. TiZANidine HCl (4MG  Tablet, Oral) Active. Oxycodone-Acetaminophen (10-325MG  Tablet, Oral as needed) Active. Sildenafil Citrate (20MG  Tablet, Oral) Active. Metoprolol Succinate ER (25MG  Tablet ER 24HR, Oral two times daily) Active. Aspirin (81MG  Tablet Chewable, Oral) Active. Valium (10MG  Tablet, Oral) Active. Medications Reconciled  Social History [name], CMA; 12/15/2015 9:33 AM) Alcohol use Occasional alcohol use. Caffeine use Coffee, Tea. No drug use Tobacco use Never smoker.  Family History [name], CMA; 12/15/2015 9:33 AM) Cerebrovascular Accident Mother. Hypertension Mother.    Review of Systems [name] CMA; 12/15/2015 9:33 AM) General Not Present- Appetite Loss, Chills, Fatigue, Fever, Night Sweats, Weight Gain and Weight Loss. Skin Not Present- Change in Wart/Mole, Dryness, Hives, Jaundice, New Lesions, Non-Healing Wounds, Rash and Ulcer. HEENT Not Present- Earache, Hearing Loss, Hoarseness, Nose Bleed, Oral Ulcers, Ringing in the Ears, Seasonal Allergies, Sinus Pain, Sore Throat, Visual Disturbances, Wears glasses/contact lenses and Yellow Eyes. Respiratory Not Present- Bloody sputum, Chronic Cough, Difficulty Breathing, Snoring and Wheezing. Breast Not Present- Breast Mass, Breast Pain, Nipple Discharge and Skin Changes. Cardiovascular Not Present- Chest Pain, Difficulty Breathing Lying Down, Leg Cramps, Palpitations, Rapid Heart Rate, Shortness of Breath and Swelling of Extremities. Gastrointestinal Present- Abdominal Pain, Bloody Stool, Change in Bowel Habits and Constipation. Not Present- Bloating, Chronic diarrhea, Difficulty Swallowing, Excessive gas, Gets full quickly at meals, Hemorrhoids, Indigestion, Nausea, Rectal Pain and Vomiting. Male Genitourinary Not Present- Blood in Urine, Change in Urinary Stream, Frequency, Impotence, Nocturia, Painful Urination, Urgency and Urine Leakage. Musculoskeletal Present- Back Pain. Not Present-  Joint Pain, Joint Stiffness, Muscle Pain, Muscle Weakness and Swelling of Extremities. Neurological Not Present- Decreased Memory, Fainting, Headaches, Numbness, Seizures, Tingling, Tremor, Trouble walking and Weakness. Psychiatric Not Present- Anxiety, Bipolar, Change in Sleep Pattern, Depression, Fearful and Frequent crying. Endocrine Not Present- Cold  Intolerance, Excessive Hunger, Hair Changes, Heat Intolerance, Hot flashes and New Diabetes. Hematology Not Present- Blood Thinners, Easy Bruising, Excessive bleeding, Gland problems, HIV and Persistent Infections.  Vitals ([name] CMA; 12/15/2015 9:34 AM) 12/15/2015 9:34 AM Weight: 256 lb Height: 70in Body Surface Area: 2.32 m Body Mass Index: 36.73 kg/m  Temp.: 3F(Temporal)  Pulse: 79 (Regular)  BP: 128/82 (Sitting, Left Arm, Standard)       Physical Exam Jeff Ruff MD; [id number] 10:12 AM) General Mental Status-Alert. General Appearance-Not in acute distress. Build & Nutrition-Well nourished. Posture-Normal posture. Gait-Normal.  Head and Neck Head-normocephalic, atraumatic with no lesions or palpable masses. Trachea-midline.  Chest and Lung Exam Chest and lung exam reveals -on auscultation, normal breath sounds, no adventitious sounds and normal vocal resonance.  Cardiovascular Cardiovascular examination reveals -normal heart sounds, regular rate and rhythm with no murmurs.  Abdomen Inspection Inspection of the abdomen reveals - No Hernias. Note: Umbilical scar noted, previous hernia repair as a child. Palpation/Percussion Palpation and Percussion of the abdomen reveal - Soft, Non Tender, No Rigidity (guarding), No hepatosplenomegaly and No Palpable abdominal masses.  Neurologic Neurologic evaluation reveals -alert and oriented x 3 with no impairment of recent or remote memory, normal attention span and ability to concentrate, normal sensation and normal  coordination.  Musculoskeletal Normal Exam - Bilateral-Upper Extremity Strength Normal and Lower Extremity Strength Normal.    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 CT scan concerning for liver metastases and also has some small lung nodules. CEA significantly elevated at greater than 300. I have discussed his case with his oncologist and his gastroenterologist.  It is felt that he warrants up front surgery.  We will plan on performing a liver biopsy at the beginning of the case and placing a port for chemotherapy as well.  We will have him use MiraLAX on a daily basis to see if we can resolve his obstructive symptoms medically. He will follow-up with Dr. Ammie Dalton on Friday. The surgery and anatomy were described to the patient as well as the risks of surgery and the possible complications.  These include: Bleeding, deep abdominal infections and possible wound complications such as hernia and infection, damage to adjacent structures, leak of surgical connections, which can lead to other surgeries and possibly an ostomy, possible need for other procedures, such as abscess drains in radiology, possible prolonged hospital stay, possible diarrhea from removal of part of the colon, possible constipation from narcotics, prolonged fatigue/weakness or appetite loss, possible early recurrence of of disease, possible complications of their medical problems such as heart disease or arrhythmias or lung problems, death (less than 1%). I believe the patient understands and wishes to proceed with the surgery. Risks of bleeding from liver biopsy and bleeding, device malfunction and pneumothorax from port placement were discussed with the patient as well.

## 2016-01-14 NOTE — Anesthesia Preprocedure Evaluation (Addendum)
Anesthesia Evaluation  Patient identified by MRN, date of birth, ID band Patient awake    Reviewed: Allergy & Precautions, NPO status , Patient's Chart, lab work & pertinent test results  Airway Mallampati: II  TM Distance: >3 FB Neck ROM: Full    Dental  (+) Missing,    Pulmonary asthma ,    Pulmonary exam normal breath sounds clear to auscultation       Cardiovascular hypertension, + Past MI and + Peripheral Vascular Disease  Normal cardiovascular exam Rhythm:Regular Rate:Normal     Neuro/Psych PSYCHIATRIC DISORDERS Anxiety Depression negative neurological ROS     GI/Hepatic Neg liver ROS, hiatal hernia, GERD  ,  Endo/Other  Hypothyroidism   Renal/GU negative Renal ROS  negative genitourinary   Musculoskeletal  (+) Arthritis ,   Abdominal (+) + obese,   Peds negative pediatric ROS (+)  Hematology negative hematology ROS (+)   Anesthesia Other Findings   Reproductive/Obstetrics negative OB ROS                            Anesthesia Physical Anesthesia Plan  ASA: II  Anesthesia Plan: General   Post-op Pain Management:    Induction: Intravenous  Airway Management Planned: Oral ETT  Additional Equipment:   Intra-op Plan:   Post-operative Plan: Extubation in OR  Informed Consent: I have reviewed the patients History and Physical, chart, labs and discussed the procedure including the risks, benefits and alternatives for the proposed anesthesia with the patient or authorized representative who has indicated his/her understanding and acceptance.   Dental advisory given  Plan Discussed with: CRNA  Anesthesia Plan Comments:         Anesthesia Quick Evaluation

## 2016-01-14 NOTE — Interval H&P Note (Signed)
History and Physical Interval Note:  01/14/2016 11:47 AM  Jeff [name]  has presented today for surgery, with the diagnosis of Colon cancer  The various methods of treatment have been discussed with the patient and family. After consideration of risks, benefits and other options for treatment, the patient has consented to  Procedure(s): LAPAROSCOPIC SIGMOID COLECTOMY (N/A) LIVER BIOPSY (N/A) INSERTION PORT-A-CATH (N/A) as a surgical intervention .  The patient's history has been reviewed, patient examined, no change in status, stable for surgery.  I have reviewed the patient's chart and labs.  Questions were answered to the patient's satisfaction.  Port placement risks include bleeding, infection, device failure and pneumothorax.  I explained this to him and he agreed to proceed.     Rosario Adie, MD  [facility]

## 2016-01-14 NOTE — Op Note (Signed)
01/14/2016  2:55 PM  PATIENT:  Jeff [name]  50 y.o. male  Patient Care Team: Charlynn Court, NP as PCP - General (Nurse Practitioner) Tania Ade, RN as Registered Nurse  PRE-OPERATIVE DIAGNOSIS:  Colon cancer  POST-OPERATIVE DIAGNOSIS:  Metastatic colon cancer  PROCEDURE:  LAPAROSCOPIC SIGMOID COLECTOMY LIVER BIOPSY INSERTION PORT-A-CATH   Surgeon(s): Leighton Ruff, MD Jackolyn Confer, MD  ASSISTANT: Dr Zella Richer   ANESTHESIA:   local and general  EBL: 46ml Total I/O In: 1000 [I.V.:1000] Out: 250 [Urine:250]  Delay start of Pharmacological VTE agent (>24hrs) due to surgical blood loss or risk of bleeding:  no  DRAINS: none   SPECIMEN:  Source of Specimen:  Sigmoid colon  DISPOSITION OF SPECIMEN:  PATHOLOGY  COUNTS:  YES  PLAN OF CARE: Admit to inpatient   PATIENT DISPOSITION:  PACU - hemodynamically stable.  INDICATION:    50 y.o. M with signs and symptoms consistent with partially obstructive, metastatic colon cancer.  I recommended segmental resection and port placement:  A liver biopsy was obtained to show metastatic disease.  The anatomy & physiology of the digestive tract was discussed.  The pathophysiology was discussed.  Natural history risks without surgery was discussed.   I worked to give an overview of the disease and the frequent need to have multispecialty involvement.  I feel the risks of no intervention will lead to serious problems that outweigh the operative risks; therefore, I recommended a partial colectomy to remove the pathology.  Laparoscopic & open techniques were discussed.   Risks such as bleeding, infection, abscess, leak, reoperation, possible ostomy, hernia, heart attack, death, and other risks were discussed.  I noted a good likelihood this will help address the problem.   Goals of post-operative recovery were discussed as well.    The patient expressed understanding & wished to proceed with surgery.  OR FINDINGS:   Patient  had sigmoid colon mass  Obvious metastatic disease on the liver.  The anastomosis rests 15 cm from the anal verge by rigid proctoscopy.  DESCRIPTION:   Informed consent was confirmed.  The patient underwent general anaesthesia without difficulty.  The patient was positioned appropriately.  VTE prevention in place.  The patient's abdomen was clipped, prepped, & draped in a sterile fashion.  Surgical timeout confirmed our plan.  The patient was positioned in reverse Trendelenburg.  Abdominal entry was gained using a Varies in the LUQ.  Entry was clean.  I induced carbon dioxide insufflation.  Camera inspection revealed no injury.  Extra ports were carefully placed under direct laparoscopic visualization. I began by identifying liver lesions on the surface of her left liver.  I used biopsy forceps to biopsy one of the lesions above the stomach.  Hemostasis was achieved with electrocautery afterwards.  The biopsy specimens were sent to pathology for frozen evaluation. A diagnosis of metastatic adenocarcinoma was confirmed on frozen.  I reflected the greater omentum and the upper abdomen the small bowel in the upper abdomen. I scored the base of peritoneum of the right side of the mesentery of the left colon from the ligament of Treitz to the peritoneal reflection of the mid rectum.  The patient had dense adhesions to the left pelvic sidewall superior to the left ureter. These were taken down using blunt dissection.  I elevated the sigmoid mesentery and enetered into the retro-mesenteric plane. We were able to identify the left ureter and gonadal vessels. We kept those posterior within the retroperitoneum and elevated the left colon  mesentery off that. I did isolated IMA pedicle but did not ligate it yet.  I continued distally and got into the avascular plane posterior to the mesorectum. This allowed me to help mobilize the rectum as well by freeing the mesorectum off the sacrum.  I mobilized the peritoneal  coverings towards the peritoneal reflection on both the right and left sides of the rectum.  I could see the right and left ureters and stayed away from them.    I skeletonized the inferior mesenteric artery pedicle.  I went down to its takeoff from the aorta.  After confirming the left ureter was out of the way, I went ahead and ligated the inferior mesenteric artery pedicle with bipolar EnSeal ~2cm above its takeoff from the aorta.  We ensured hemostasis. I skeletonized the mesorectum at the junction at the proximal rectum using blunt dissection & bipolar EnSeal.  I mobilized the left colon in a lateral to medial fashion off the line of Toldt up towards the splenic flexure to ensure good mobilization of the left colon to reach into the pelvis.  I skeletonized the mesentery at the level of the vessels up towards the remaining descending colon with the in Enseal device. I then enlarged my 11 mm port in the right lower quadrant and placed an [place] wound protector. The colon was brought out through this and a purse stringer was placed upon the previously dissected area. A 2-0 Prolene pursestring was performed. The colon was transected with cautery. There was mild bleeding noted along the cut edges and good perfusion noted of the mucosa.  A 33 mm EEA anvil was placed into the open colon and the pursestring was tied tightly around this. This was then placed back into the abdomen and an anastomosis was created between the distal rectum and descending colon under laparoscopic visualization. There was no tension. There was no leak when insufflated under water. The abdomen was irrigated and the omentum was brought down over the small bowel. Our ports an Alexis wound protector were removed. We then switched to clean gowns, gloves, drapes and instruments. The fascia was closed with interrupted 0 Novafil sutures. The subcutaneous tissue was closed using interrupted 2-0 Vicryl sutures. The skin of the port sites as well as  the extraction site was closed using 4-0 Vicryl subcuticular sutures. A sterile dressing was placed in the extraction site and skin glue was placed on the port sites.  The patient was then redraped and prepped for Port-A-Cath insertion.   8 [place] power port. It goes through the right internal jugular vein   Procedure: Informed consent was confirmed. Neck and chest were clipped and prepped and draped in a sterile fashion. A surgical timeout confirmed our plan. I placed a field block of local anesthesia on the chest.   I entered into the right internal jugular on the first venipuncture using a sterile ultrasound. Non-pulsatile blood was returned. The wire did not easily pass.  I pulled my needle out and accessed the vein at a different angle, also using the ultrasound.  Wire was easily passed into the inferior vena cava and confirmed by fluoroscopy. I confirmed placement of the wire in the right side of the chest.  I made an incision in the lateral infraclavicular area and made a subcutaneous pocket. I used a dilator on the wire using Seldinger technique to dilate the tract under fluoroscopy. I placed the catheter into the sheath. I then peeled away the dilator sheath. I tunneled the power  port from the puncture site to the chest pocket. I cut the catheter to appropriate length and attached it to the port using the plastic connector. The port was placed into the pocket and secured to the left anterior chest wall using 2-0 Prolene interrupted stitches x2. Catheter flushed well.  Fluoroscopy confirmed the tip in the distal SVC. Catheter aspirated and flushed well. On final fluoro reevaluation the tip seen to be in good position in the distal SVC.  I closed the wounds using 3-0 Vicryl interrupted sutures for the pocket and 4-0 Vicryl stitch was used to close the skin. Dermabond was used on the 2 incisions. CXR will be performed in PACU. Patient should go home later today. Catheter is okay to use.

## 2016-01-14 NOTE — Transfer of Care (Signed)
Immediate Anesthesia Transfer of Care Note  Patient: Jeff [name]  Procedure(s) Performed: Procedure(s): LAPAROSCOPIC SIGMOID COLECTOMY (N/A) LIVER BIOPSY (N/A) INSERTION PORT-A-CATH (Right)  Patient Location: PACU  Anesthesia Type:General  Level of Consciousness:  sedated, patient cooperative and responds to stimulation  Airway & Oxygen Therapy:Patient Spontanous Breathing and Patient connected to face mask oxgen  Post-op Assessment:  Report given to PACU RN and Post -op Vital signs reviewed and stable  Post vital signs:  Reviewed and stable  Last Vitals:  Vitals:   01/14/16 0932  BP: 125/78  Pulse: 95  Resp: 16  Temp: [id number] C    Complications: No apparent anesthesia complications

## 2016-01-15 ENCOUNTER — Encounter (HOSPITAL_COMMUNITY): Payer: Self-pay | Admitting: General Surgery

## 2016-01-15 LAB — CBC
HCT: 35.8 % — ABNORMAL LOW (ref 39.0–52.0)
Hemoglobin: 11.4 g/dL — ABNORMAL LOW (ref 13.0–17.0)
MCH: 24.8 pg — ABNORMAL LOW (ref 26.0–34.0)
MCHC: 31.8 g/dL (ref 30.0–36.0)
MCV: 78 fL (ref 78.0–100.0)
Platelets: 225 10*3/uL (ref 150–400)
RBC: 4.59 MIL/uL (ref 4.22–5.81)
RDW: 13.9 % (ref 11.5–15.5)
WBC: 12.5 10*3/uL — ABNORMAL HIGH (ref 4.0–10.5)

## 2016-01-15 LAB — BASIC METABOLIC PANEL
Anion gap: 6 (ref 5–15)
BUN: 10 mg/dL (ref 6–20)
CO2: 27 mmol/L (ref 22–32)
Calcium: 9.1 mg/dL (ref 8.9–10.3)
Chloride: 106 mmol/L (ref 101–111)
Creatinine, Ser: 1.19 mg/dL (ref 0.61–1.24)
GFR calc Af Amer: >60 mL/min (ref >60)
GFR calc non Af Amer: >60 mL/min (ref >60)
Glucose, Bld: 159 mg/dL — ABNORMAL HIGH (ref 65–99)
Potassium: 4.3 mmol/L (ref 3.5–5.1)
Sodium: 139 mmol/L (ref 135–145)

## 2016-01-15 MED ORDER — SODIUM CHLORIDE 0.9% FLUSH
10.0000 mL | INTRAVENOUS | Status: DC | PRN
  Administered 2016-01-17 (×2): 10 mL
  Filled 2016-01-15 (×2): qty 40

## 2016-01-15 NOTE — Progress Notes (Signed)
1 Day Post-Op Robotic Sigmoidectomy and PAC Subjective: Doing well, passing flatus, no nausea  Objective: Vital signs in last 24 hours: Temp:  [97.5 F (36.4 C)-98.4 F (36.9 C)] 97.7 F (36.5 C) (09/01 0559) Pulse Rate:  [79-110] 79 (09/01 0559) Resp:  [16-23] 16 (09/01 0559) BP: (113-145)/(49-83) 113/64 (09/01 0559) SpO2:  [96 %-100 %] 100 % (09/01 0559) Weight:  [115.2 kg (254 lb)] 115.2 kg (254 lb) (08/31 0953)   Intake/Output from previous day: 08/31 0701 - 09/01 0700 In: 3536.3 [I.V.:3386.3; IV Piggyback:150] Out: [id number] [Urine:4500; Blood:50] Intake/Output this shift: No intake/output data recorded.   General appearance: alert and cooperative GI: soft, non-distended  Incision: no significant drainage  Lab Results:   Recent Labs  01/15/16 0415  WBC 12.5*  HGB 11.4*  HCT 35.8*  PLT 225   BMET  Recent Labs  01/15/16 0415  NA 139  K 4.3  CL 106  CO2 27  GLUCOSE 159*  BUN 10  CREATININE 1.19  CALCIUM 9.1   PT/INR No results for input(s): LABPROT, INR in the last 72 hours. ABG No results for input(s): PHART, HCO3 in the last 72 hours.  Invalid input(s): PCO2, PO2  MEDS, Scheduled . acetaminophen  1,000 mg Oral Q6H  . alvimopan  12 mg Oral BID  . amLODipine  5 mg Oral Daily  . atorvastatin  40 mg Oral QPM  . enoxaparin (LOVENOX) injection  40 mg Subcutaneous Q24H  . levothyroxine  112 mcg Oral QAC breakfast  . metoprolol  25 mg Oral BID  . pantoprazole  40 mg Oral Daily  . tamsulosin  0.4 mg Oral QPM  . venlafaxine XR  75 mg Oral Q breakfast    Studies/Results: Dg Chest Port 1 View  Result Date: 01/14/2016 CLINICAL DATA:  Operative Port-A-Cath placement. Current history of colon cancer. EXAM: PORTABLE CHEST 1 VIEW COMPARISON:  CT chest 12/11/2015.  Chest x-ray 07/09/2013. FINDINGS: Right jugular Port-A-Cath tip projects over the upper SVC. No evidence of pneumothorax or mediastinal hematoma. Cardiac silhouette upper normal in size for AP  portable technique. Lungs clear. Bronchovascular markings normal. Pulmonary vascularity normal. IMPRESSION: 1. Right jugular Port-A-Cath tip projects over the upper SVC. No acute complicating features. 2.  No acute cardiopulmonary disease. Electronically Signed   By: Evangeline Dakin M.D.   On: 01/14/2016 16:50    Assessment: s/p Procedure(s): LAPAROSCOPIC SIGMOID COLECTOMY LIVER BIOPSY INSERTION PORT-A-CATH Patient Active Problem List   Diagnosis Date Noted  . Metastatic colon cancer to liver ([place]) 01/14/2016  . Cancer of sigmoid colon ([place]) 12/18/2015  . Aftercare following surgery of the circulatory system 09/23/2015  . Left leg claudication ([place]) 09/11/2015  . Ureterolithiasis 01/04/2015  . ED (erectile dysfunction) 12/09/2014  . Hypothyroidism following radioiodine therapy 05/26/2014  . PAD (peripheral artery disease) ([place]) 02/11/2014  . Lumbar facet arthropathy 07/15/2013  . Chest pain, mid sternal 06/12/2012  . Acute non-ST segment elevation myocardial infarction ([place]) 06/11/2012  . Hyperlipidemia 06/11/2012  . 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 Hypertension 03/16/2010    Expected post op course  Plan: d/c foley  Bowel function already returning, advance diet as tolerated Ambulate Minimize IVF's   LOS: 1 day     .Rosario Adie, [facility], [place]   01/15/2016 7:31 AM

## 2016-01-15 NOTE — Evaluation (Signed)
Physical Therapy One Time Evaluation Patient Details Name: Jeff [name] MRN: Jeff [name] DOB: 29-Oct-1965 Today's Date: 01/15/2016   History of Present Illness  Pt is a 50 year old male Post-Op Day 1 Robotic Sigmoidectomy and PAC with hx of chronic back pain, multiple back surgeries  Clinical Impression  Patient evaluated by Physical Therapy with no further acute PT needs identified. All education has been completed and the patient has no further questions.  Pt mobilizing very well and encouraged to ambulate several times a day during acute stay. See below for any follow-up Physical Therapy or equipment needs. PT is signing off. Thank you for this referral.     Follow Up Recommendations No PT follow up    Equipment Recommendations  None recommended by PT    Recommendations for Other Services       Precautions / Restrictions Precautions Precautions: None      Mobility  Bed Mobility Overal bed mobility: Modified Independent                Transfers Overall transfer level: Modified independent                  Ambulation/Gait Ambulation/Gait assistance: Modified independent (Device/Increase time);Supervision Ambulation Distance (Feet): 800 Feet Assistive device: None Gait Pattern/deviations: WFL(Within Functional Limits)     General Gait Details: no deficits observed  Stairs            Wheelchair Mobility    Modified Rankin (Stroke Patients Only)       Balance                                             Pertinent Vitals/Pain Pain Assessment: 0-10 Pain Score: 4  Pain Location: surgical site Pain Descriptors / Indicators: Sore Pain Intervention(s): Limited activity within patient's tolerance;Monitored during session;Repositioned    Home Living Family/patient expects to be discharged to:: Private residence Living Arrangements: Spouse/significant other;Other relatives Available Help at Discharge: Family;Available  PRN/intermittently Type of Home: Apartment Home Access: Stairs to enter Entrance Stairs-Rails: Left Entrance Stairs-Number of Steps: flight Home Layout: One level Home Equipment: None      Prior Function Level of Independence: Independent               Hand Dominance        Extremity/Trunk Assessment   Upper Extremity Assessment: Overall WFL for tasks assessed           Lower Extremity Assessment: Overall WFL for tasks assessed         Communication   Communication: No difficulties  Cognition Arousal/Alertness: Awake/alert Behavior During Therapy: WFL for tasks assessed/performed Overall Cognitive Status: Within Functional Limits for tasks assessed                      General Comments      Exercises        Assessment/Plan    PT Assessment Patent does not need any further PT services  PT Diagnosis Difficulty walking   PT Problem List    PT Treatment Interventions     PT Goals (Current goals can be found in the Care Plan section) Acute Rehab PT Goals PT Goal Formulation: All assessment and education complete, DC therapy    Frequency     Barriers to discharge        Co-evaluation  End of Session   Activity Tolerance: Patient tolerated treatment well Patient left: in chair;with call bell/phone within reach Nurse Communication: Mobility status         Time: 0920-0930 PT Time Calculation (min) (ACUTE ONLY): 10 min   Charges:   PT Evaluation $PT Eval Low Complexity: 1 Procedure     PT G Codes:        [name],KATHrine E 01/15/2016, 1:33 PM Carmelia Bake, PT, DPT 01/15/2016 Pager: [phone]

## 2016-01-15 NOTE — Discharge Instructions (Signed)

## 2016-01-16 LAB — BASIC METABOLIC PANEL
Anion gap: 7 (ref 5–15)
BUN: 9 mg/dL (ref 6–20)
CO2: 29 mmol/L (ref 22–32)
Calcium: 8.8 mg/dL — ABNORMAL LOW (ref 8.9–10.3)
Chloride: 104 mmol/L (ref 101–111)
Creatinine, Ser: 1.08 mg/dL (ref 0.61–1.24)
GFR calc Af Amer: >60 mL/min (ref >60)
GFR calc non Af Amer: >60 mL/min (ref >60)
Glucose, Bld: 105 mg/dL — ABNORMAL HIGH (ref 65–99)
Potassium: 3.8 mmol/L (ref 3.5–5.1)
Sodium: 140 mmol/L (ref 135–145)

## 2016-01-16 LAB — CBC
HCT: 35.9 % — ABNORMAL LOW (ref 39.0–52.0)
Hemoglobin: 11.4 g/dL — ABNORMAL LOW (ref 13.0–17.0)
MCH: 24.9 pg — ABNORMAL LOW (ref 26.0–34.0)
MCHC: 31.8 g/dL (ref 30.0–36.0)
MCV: 78.6 fL (ref 78.0–100.0)
Platelets: 183 10*3/uL (ref 150–400)
RBC: 4.57 MIL/uL (ref 4.22–5.81)
RDW: 14 % (ref 11.5–15.5)
WBC: 13.8 10*3/uL — ABNORMAL HIGH (ref 4.0–10.5)

## 2016-01-16 MED ORDER — OXYCODONE HCL 5 MG PO TABS
5.0000 mg | ORAL_TABLET | ORAL | Status: DC | PRN
  Administered 2016-01-16 – 2016-01-17 (×5): 10 mg via ORAL
  Filled 2016-01-16 (×5): qty 2

## 2016-01-16 MED ORDER — TRAMADOL HCL 50 MG PO TABS: 100.0000 mg | ORAL_TABLET | Freq: Two times a day (BID) | ORAL | Status: DC | PRN

## 2016-01-16 MED ORDER — ACETAMINOPHEN 500 MG PO TABS
1000.0000 mg | ORAL_TABLET | Freq: Three times a day (TID) | ORAL | Status: DC
  Administered 2016-01-16 – 2016-01-17 (×4): 1000 mg via ORAL
  Filled 2016-01-16 (×4): qty 2

## 2016-01-16 MED ORDER — IBUPROFEN 200 MG PO TABS: 600.0000 mg | ORAL_TABLET | Freq: Four times a day (QID) | ORAL | Status: DC | PRN

## 2016-01-16 NOTE — Progress Notes (Signed)
Patient ID: Jeff [name], male   DOB: Aug 28, 1965, 50 y.o.   MRN: [id number] 2 Days Post-Op Robotic Sigmoidectomy and PAC Subjective: Continues flatus.  Had small BM.  No N/v.  Pain tolerable.    Objective: Vital signs in last 24 hours: Temp:  [97.7 F (36.5 C)-98.1 F (36.7 C)] 97.9 F (36.6 C) (09/02 [id number]) Pulse Rate:  [75-86] 86 (09/02 0633) Resp:  [15-16] 16 (09/02 0633) BP: (122-147)/(74-81) 122/81 (09/02 [id number]) SpO2:  [98 %-100 %] 100 % (09/02 [id number])   Intake/Output from previous day: 09/01 0701 - 09/02 0700 In: 2225.4 [P.O.:960; I.V.:1265.4] Out: 1150 [Urine:1150] Intake/Output this shift: No intake/output data recorded.   General appearance: alert and cooperative GI: soft, non-distended  Incision: no significant drainage  Lab Results:   Recent Labs  01/15/16 0415 01/16/16 0406  WBC 12.5* 13.8*  HGB 11.4* 11.4*  HCT 35.8* 35.9*  PLT 225 183   BMET  Recent Labs  01/15/16 0415 01/16/16 0406  NA 139 140  K 4.3 3.8  CL 106 104  CO2 27 29  GLUCOSE 159* 105*  BUN 10 9  CREATININE 1.19 1.08  CALCIUM 9.1 8.8*   PT/INR No results for input(s): LABPROT, INR in the last 72 hours. ABG No results for input(s): PHART, HCO3 in the last 72 hours.  Invalid input(s): PCO2, PO2  MEDS, Scheduled . alvimopan  12 mg Oral BID  . amLODipine  5 mg Oral Daily  . atorvastatin  40 mg Oral QPM  . enoxaparin (LOVENOX) injection  40 mg Subcutaneous Q24H  . levothyroxine  112 mcg Oral QAC breakfast  . metoprolol  25 mg Oral BID  . pantoprazole  40 mg Oral Daily  . tamsulosin  0.4 mg Oral QPM  . venlafaxine XR  75 mg Oral Q breakfast    Studies/Results: Dg Chest Port 1 View  Result Date: 01/14/2016 CLINICAL DATA:  Operative Port-A-Cath placement. Current history of colon cancer. EXAM: PORTABLE CHEST 1 VIEW COMPARISON:  CT chest 12/11/2015.  Chest x-ray 07/09/2013. FINDINGS: Right jugular Port-A-Cath tip projects over the upper SVC. No evidence of pneumothorax or  mediastinal hematoma. Cardiac silhouette upper normal in size for AP portable technique. Lungs clear. Bronchovascular markings normal. Pulmonary vascularity normal. IMPRESSION: 1. Right jugular Port-A-Cath tip projects over the upper SVC. No acute complicating features. 2.  No acute cardiopulmonary disease. Electronically Signed   By: Evangeline Dakin M.D.   On: 01/14/2016 16:50    Assessment: s/p Procedure(s): LAPAROSCOPIC SIGMOID COLECTOMY LIVER BIOPSY INSERTION PORT-A-CATH Patient Active Problem List   Diagnosis Date Noted  . Metastatic colon cancer to liver ([place]) 01/14/2016  . Cancer of sigmoid colon ([place]) 12/18/2015  . Aftercare following surgery of the circulatory system 09/23/2015  . Left leg claudication ([place]) 09/11/2015  . Ureterolithiasis 01/04/2015  . ED (erectile dysfunction) 12/09/2014  . Hypothyroidism following radioiodine therapy 05/26/2014  . PAD (peripheral artery disease) ([place]) 02/11/2014  . Lumbar facet arthropathy 07/15/2013  . Chest pain, mid sternal 06/12/2012  . Acute non-ST segment elevation myocardial infarction ([place]) 06/11/2012  . Hyperlipidemia 06/11/2012  . 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 Hypertension 03/16/2010    Expected post op course  Plan: d/c foley   Regular diet. Saline lock ivf [place] for home tomorrow.   LOS: 2 days   [facility], [place] [phone]   01/16/2016 11:18 AM

## 2016-01-17 ENCOUNTER — Telehealth: Payer: Self-pay | Admitting: Oncology

## 2016-01-17 LAB — BASIC METABOLIC PANEL
Anion gap: 5 (ref 5–15)
BUN: 8 mg/dL (ref 6–20)
CO2: 31 mmol/L (ref 22–32)
Calcium: 8.4 mg/dL — ABNORMAL LOW (ref 8.9–10.3)
Chloride: 104 mmol/L (ref 101–111)
Creatinine, Ser: 0.9 mg/dL (ref 0.61–1.24)
GFR calc Af Amer: >60 mL/min (ref >60)
GFR calc non Af Amer: >60 mL/min (ref >60)
Glucose, Bld: 108 mg/dL — ABNORMAL HIGH (ref 65–99)
Potassium: 3.4 mmol/L — ABNORMAL LOW (ref 3.5–5.1)
Sodium: 140 mmol/L (ref 135–145)

## 2016-01-17 LAB — CBC
HCT: 34.9 % — ABNORMAL LOW (ref 39.0–52.0)
Hemoglobin: 11 g/dL — ABNORMAL LOW (ref 13.0–17.0)
MCH: 24.9 pg — ABNORMAL LOW (ref 26.0–34.0)
MCHC: 31.5 g/dL (ref 30.0–36.0)
MCV: 79 fL (ref 78.0–100.0)
Platelets: 187 10*3/uL (ref 150–400)
RBC: 4.42 MIL/uL (ref 4.22–5.81)
RDW: 14 % (ref 11.5–15.5)
WBC: 10.8 10*3/uL — ABNORMAL HIGH (ref 4.0–10.5)

## 2016-01-17 MED ORDER — HEPARIN SOD (PORK) LOCK FLUSH 100 UNIT/ML IV SOLN
500.0000 [IU] | INTRAVENOUS | Status: AC | PRN
  Administered 2016-01-17: 500 [IU]

## 2016-01-17 MED ORDER — OXYCODONE-ACETAMINOPHEN 5-325 MG PO TABS: 1.0000 | ORAL_TABLET | ORAL | Status: DC | PRN

## 2016-01-17 MED ORDER — OXYCODONE-ACETAMINOPHEN 5-325 MG PO TABS: 1.0000 | ORAL_TABLET | ORAL | 0 refills | Status: DC | PRN

## 2016-01-17 NOTE — Progress Notes (Signed)
Assessment unchanged. Pt and wife verbalized understanding of dc instructions through teach back including follow up care and when to call the doctor. Script x 1 given as provided by Md. Discharged via foot per pt request to front entrance to meet wife and awaiting vehicle to carry home. Accompanied by NT.

## 2016-01-17 NOTE — Telephone Encounter (Signed)
Sw pt's wife, gave appt 9/14 @ 3.15.

## 2016-01-17 NOTE — Progress Notes (Signed)
[facility] Office:  [phone] General Surgery Progress Note   LOS: 3 days  POD -  3 Days Post-Op  Assessment/Plan: 1.  LAPAROSCOPIC SIGMOID COLECTOMY, LIVER BIOPSY,  INSERTION PORT-A-CATH - 01/14/2016 - A. Thomas  WBC - 10,300 - 01/17/2016  2. CAD - had cath by Dr. Edmonia James 06/12/2012 HIstory of MI about 2103 Echo - 01/05/2015 - EF - 60% Followed by Dr. Rod Can 3. Multiple back and neck operations Last surgery by Dr. Orinda Kenner - 12/10/2012 - C3-C4 arthrodesis He says that he has had 5 back/neck operations 4. Nephrolithiasis Obstructed right ureter - 01/04/2015 - Dahlstedt 5. PVD - left leg bypass - 09/11/2015 - Brabham   6.  DVT prophylaxis - Lovenox   Active Problems:   Metastatic colon cancer to liver [facility])  Subjective:  He has done very well.  He is eating, pooping, and ready to go home.  His wife is in the room with him.  Objective:   Vitals:   01/16/16 2055 01/17/16 0707  BP: 114/77 117/72  Pulse: 89 81  Resp: 16 16  Temp: 98.2 F (36.8 C) 98.2 F (36.8 C)     Intake/Output from previous day:  09/02 0701 - 09/03 0700 In: 720 [P.O.:720] Out: -   Intake/Output this shift:  Total I/O In: 240 [P.O.:240] Out: -    Physical Exam:   General: WN AA M who is alert and oriented.    HEENT: Normal. Pupils equal. .   Lungs: Clear.   Abdomen: soft.   Wound: Incisions look good.     Lab Results:    Recent Labs  01/16/16 0406 01/17/16 0500  WBC 13.8* 10.8*  HGB 11.4* 11.0*  HCT 35.9* 34.9*  PLT 183 187    BMET   Recent Labs  01/16/16 0406 01/17/16 0500  NA 140 140  K 3.8 3.4*  CL 104 104  CO2 29 31  GLUCOSE 105* 108*  BUN 9 8  CREATININE 1.08 0.90  CALCIUM 8.8* 8.4*    PT/INR  No results for input(s): LABPROT, INR in the last 72 hours.  ABG  No results for input(s): PHART, HCO3 in the last 72 hours.  Invalid input(s): PCO2, PO2   Studies/Results:  No results  found.   Anti-infectives:   Anti-infectives    Start     Dose/Rate Route Frequency Ordered Stop   01/14/16 2200  cefoTEtan (CEFOTAN) 2 g in dextrose 5 % 50 mL IVPB     2 g 100 mL/hr over 30 Minutes Intravenous Every 12 hours 01/14/16 1802 01/15/16 0013   01/14/16 0934  cefoTEtan (CEFOTAN) 2 g in dextrose 5 % 50 mL IVPB     2 g 100 mL/hr over 30 Minutes Intravenous On call to O.R. 01/14/16 0934 01/14/16 1231      Alphonsa Overall, MD, FACS Pager: [facility] Office: [phone] 01/17/2016

## 2016-01-17 NOTE — Discharge Summary (Signed)
Physician Discharge Summary  Patient ID:  Jeff [name]  MRN: [id number]  DOB/AGE: [birth year] 50 y.o.  Admit date: 01/14/2016 Discharge date: 01/17/2016  Discharge Diagnoses:  1.  LAPAROSCOPIC SIGMOID COLECTOMY, LIVER BIOPSY,  INSERTION PORT-A-CATH - 01/14/2016 - A. [name]  Path is pending at the time of discharge  2. CAD - had cath by Dr. Edmonia James 06/12/2012 HIstory of MI about 2103 Echo - 01/05/2015 - EF - 60% Followed by Dr. Rod Can 3. Multiple back and neck operations Last surgery by Dr. Orinda Kenner - 12/10/2012 - C3-C4 arthrodesis He says that he has had 5 back/neck operations 4. Nephrolithiasis Obstructed right ureter - 01/04/2015 - Dahlstedt 5. PVD - left leg bypass - 09/11/2015 - Brabham   Active Problems:   Metastatic colon cancer to liver [facility])   Operation: Procedure(s): LAPAROSCOPIC SIGMOID COLECTOMY LIVER BIOPSY, INSERTION PORT-A-CATH on 01/14/2016 - Dr. [name]  Discharged Condition: good  Hospital Course: CHRISTAN PARADY is an 50 y.o. male whose primary care physician is Charlynn Court, NP and who was admitted 01/14/2016 with a chief complaint of metastatic sigmoid colon cancer.   He was brought to the operating room on 01/14/2016 and underwent  LAPAROSCOPIC SIGMOID COLECTOMY LIVER BIOPSY, INSERTION PORT-A-CATH.   He had his surgery on 01/13/2070.  He had done very well post op. He is on regular food, had a bowel movement, passing flatus, and ready to go home.  The discharge instructions were reviewed with the patient.  Consults: None  Significant Diagnostic Studies: Results for orders placed or performed during the hospital encounter of [id number]  Basic metabolic panel  Result Value Ref Range   Sodium 139 135 - 145 mmol/L   Potassium 4.3 3.5 - 5.1 mmol/L   Chloride 106 101 - 111 mmol/L   CO2 27 22 - 32 mmol/L   Glucose, Bld 159 (H) 65 - 99 mg/dL   BUN 10 6 - 20 mg/dL   Creatinine, Ser 1.19 0.61 - 1.24 mg/dL   Calcium  9.1 8.9 - 10.3 mg/dL   GFR calc non Af Amer >60 >60 mL/min   GFR calc Af Amer >60 >60 mL/min   Anion gap 6 5 - 15  CBC  Result Value Ref Range   WBC 12.5 (H) 4.0 - 10.5 K/uL   RBC 4.59 4.22 - 5.81 MIL/uL   Hemoglobin 11.4 (L) 13.0 - 17.0 g/dL   HCT 35.8 (L) 39.0 - 52.0 %   MCV 78.0 78.0 - 100.0 fL   MCH 24.8 (L) 26.0 - 34.0 pg   MCHC 31.8 30.0 - 36.0 g/dL   RDW 13.9 11.5 - 15.5 %   Platelets 225 150 - 400 K/uL  Basic metabolic panel  Result Value Ref Range   Sodium 140 135 - 145 mmol/L   Potassium 3.8 3.5 - 5.1 mmol/L   Chloride 104 101 - 111 mmol/L   CO2 29 22 - 32 mmol/L   Glucose, Bld 105 (H) 65 - 99 mg/dL   BUN 9 6 - 20 mg/dL   Creatinine, Ser 1.08 0.61 - 1.24 mg/dL   Calcium 8.8 (L) 8.9 - 10.3 mg/dL   GFR calc non Af Amer >60 >60 mL/min   GFR calc Af Amer >60 >60 mL/min   Anion gap 7 5 - 15  CBC  Result Value Ref Range   WBC 13.8 (H) 4.0 - 10.5 K/uL   RBC 4.57 4.22 - 5.81 MIL/uL   Hemoglobin 11.4 (L) 13.0 - 17.0 g/dL   HCT  35.9 (L) 39.0 - 52.0 %   MCV 78.6 78.0 - 100.0 fL   MCH 24.9 (L) 26.0 - 34.0 pg   MCHC 31.8 30.0 - 36.0 g/dL   RDW 14.0 11.5 - 15.5 %   Platelets 183 150 - 400 K/uL  Basic metabolic panel  Result Value Ref Range   Sodium 140 135 - 145 mmol/L   Potassium 3.4 (L) 3.5 - 5.1 mmol/L   Chloride 104 101 - 111 mmol/L   CO2 31 22 - 32 mmol/L   Glucose, Bld 108 (H) 65 - 99 mg/dL   BUN 8 6 - 20 mg/dL   Creatinine, Ser 0.90 0.61 - 1.24 mg/dL   Calcium 8.4 (L) 8.9 - 10.3 mg/dL   GFR calc non Af Amer >60 >60 mL/min   GFR calc Af Amer >60 >60 mL/min   Anion gap 5 5 - 15  CBC  Result Value Ref Range   WBC 10.8 (H) 4.0 - 10.5 K/uL   RBC 4.42 4.22 - 5.81 MIL/uL   Hemoglobin 11.0 (L) 13.0 - 17.0 g/dL   HCT 34.9 (L) 39.0 - 52.0 %   MCV 79.0 78.0 - 100.0 fL   MCH 24.9 (L) 26.0 - 34.0 pg   MCHC 31.5 30.0 - 36.0 g/dL   RDW 14.0 11.5 - 15.5 %   Platelets 187 150 - 400 K/uL    Dg Chest Port 1 View  Result Date: 01/14/2016 CLINICAL DATA:  Operative  Port-A-Cath placement. Current history of colon cancer. EXAM: PORTABLE CHEST 1 VIEW COMPARISON:  CT chest 12/11/2015.  Chest x-ray 07/09/2013. FINDINGS: Right jugular Port-A-Cath tip projects over the upper SVC. No evidence of pneumothorax or mediastinal hematoma. Cardiac silhouette upper normal in size for AP portable technique. Lungs clear. Bronchovascular markings normal. Pulmonary vascularity normal. IMPRESSION: 1. Right jugular Port-A-Cath tip projects over the upper SVC. No acute complicating features. 2.  No acute cardiopulmonary disease. Electronically Signed   By: Evangeline Dakin M.D.   On: 01/14/2016 16:50    Discharge Exam:  Vitals:   01/16/16 2055 01/17/16 0707  BP: 114/77 117/72  Pulse: 89 81  Resp: 16 16  Temp: 98.2 F (36.8 C) 98.2 F (36.8 C)    General: WN AA M who is alert and generally healthy appearing.  Lungs: Clear to auscultation and symmetric breath sounds. Heart:  RRR. No murmur or rub. Abdomen: Soft. No mass. No tenderness. No hernia. Normal bowel sounds.   Disposition: 01-Home or Self Care  Discharge Instructions    Diet - low sodium heart healthy    Complete by:  As directed   Increase activity slowly    Complete by:  As directed      Follow-up Information    Rosario Adie., MD. Schedule an appointment as soon as possible for a visit in 2 week(s).   Specialty:  General Surgery Why:  or as already scheduled Contact information: [address] [place] [address] [phone]            Signed: Alphonsa Overall, M.D., [facility] Office:  [phone]  01/17/2016, 10:45 AM

## 2016-01-19 ENCOUNTER — Emergency Department (HOSPITAL_COMMUNITY): Payer: Medicaid Other

## 2016-01-19 ENCOUNTER — Encounter (HOSPITAL_COMMUNITY): Payer: Self-pay | Admitting: Emergency Medicine

## 2016-01-19 ENCOUNTER — Inpatient Hospital Stay (HOSPITAL_COMMUNITY)
Admission: EM | Admit: 2016-01-19 | Discharge: 2016-01-21 | DRG: 908 | Disposition: A | Discharge status: Home / Self Care | Payer: Medicaid Other | Attending: General Surgery | Admitting: General Surgery

## 2016-01-19 ENCOUNTER — Observation Stay (HOSPITAL_COMMUNITY): Payer: Medicaid Other | Admitting: Certified Registered"

## 2016-01-19 ENCOUNTER — Encounter (HOSPITAL_COMMUNITY)
Admission: EM | Disposition: A | Discharge status: Home / Self Care | Payer: Self-pay | Source: Home / Self Care | Attending: General Surgery

## 2016-01-19 DIAGNOSIS — Y836 Removal of other organ (partial) (total) as the cause of abnormal reaction of the patient, or of later complication, without mention of misadventure at the time of the procedure: Secondary | ICD-10-CM | POA: Diagnosis present

## 2016-01-19 DIAGNOSIS — K430 Incisional hernia with obstruction, without gangrene: Secondary | ICD-10-CM | POA: Diagnosis present

## 2016-01-19 DIAGNOSIS — I739 Peripheral vascular disease, unspecified: Secondary | ICD-10-CM | POA: Diagnosis present

## 2016-01-19 DIAGNOSIS — Z79899 Other long term (current) drug therapy: Secondary | ICD-10-CM

## 2016-01-19 DIAGNOSIS — T8132XA Disruption of internal operation (surgical) wound, not elsewhere classified, initial encounter: Principal | ICD-10-CM | POA: Diagnosis present

## 2016-01-19 DIAGNOSIS — I2510 Atherosclerotic heart disease of native coronary artery without angina pectoris: Secondary | ICD-10-CM | POA: Diagnosis present

## 2016-01-19 DIAGNOSIS — C787 Secondary malignant neoplasm of liver and intrahepatic bile duct: Secondary | ICD-10-CM | POA: Diagnosis present

## 2016-01-19 DIAGNOSIS — F419 Anxiety disorder, unspecified: Secondary | ICD-10-CM | POA: Diagnosis present

## 2016-01-19 DIAGNOSIS — N400 Enlarged prostate without lower urinary tract symptoms: Secondary | ICD-10-CM | POA: Diagnosis present

## 2016-01-19 DIAGNOSIS — J45909 Unspecified asthma, uncomplicated: Secondary | ICD-10-CM | POA: Diagnosis present

## 2016-01-19 DIAGNOSIS — Z885 Allergy status to narcotic agent status: Secondary | ICD-10-CM

## 2016-01-19 DIAGNOSIS — Z7982 Long term (current) use of aspirin: Secondary | ICD-10-CM

## 2016-01-19 DIAGNOSIS — F329 Major depressive disorder, single episode, unspecified: Secondary | ICD-10-CM | POA: Diagnosis present

## 2016-01-19 DIAGNOSIS — N135 Crossing vessel and stricture of ureter without hydronephrosis: Secondary | ICD-10-CM | POA: Diagnosis present

## 2016-01-19 DIAGNOSIS — C187 Malignant neoplasm of sigmoid colon: Secondary | ICD-10-CM | POA: Diagnosis present

## 2016-01-19 DIAGNOSIS — E039 Hypothyroidism, unspecified: Secondary | ICD-10-CM | POA: Diagnosis present

## 2016-01-19 DIAGNOSIS — N200 Calculus of kidney: Secondary | ICD-10-CM | POA: Diagnosis present

## 2016-01-19 DIAGNOSIS — Z8249 Family history of ischemic heart disease and other diseases of the circulatory system: Secondary | ICD-10-CM

## 2016-01-19 DIAGNOSIS — K432 Incisional hernia without obstruction or gangrene: Secondary | ICD-10-CM | POA: Diagnosis present

## 2016-01-19 DIAGNOSIS — I10 Essential (primary) hypertension: Secondary | ICD-10-CM | POA: Diagnosis present

## 2016-01-19 DIAGNOSIS — F341 Dysthymic disorder: Secondary | ICD-10-CM | POA: Diagnosis present

## 2016-01-19 DIAGNOSIS — Z833 Family history of diabetes mellitus: Secondary | ICD-10-CM

## 2016-01-19 DIAGNOSIS — K56609 Unspecified intestinal obstruction, unspecified as to partial versus complete obstruction: Secondary | ICD-10-CM | POA: Diagnosis present

## 2016-01-19 DIAGNOSIS — K56600 Partial intestinal obstruction, unspecified as to cause: Secondary | ICD-10-CM

## 2016-01-19 DIAGNOSIS — Z801 Family history of malignant neoplasm of trachea, bronchus and lung: Secondary | ICD-10-CM

## 2016-01-19 DIAGNOSIS — I252 Old myocardial infarction: Secondary | ICD-10-CM

## 2016-01-19 HISTORY — PX: INCISIONAL HERNIA REPAIR: SHX193

## 2016-01-19 LAB — CBC
HCT: 41.6 % (ref 39.0–52.0)
Hemoglobin: 13 g/dL (ref 13.0–17.0)
MCH: 25 pg — ABNORMAL LOW (ref 26.0–34.0)
MCHC: 31.3 g/dL (ref 30.0–36.0)
MCV: 79.8 fL (ref 78.0–100.0)
Platelets: 254 10*3/uL (ref 150–400)
RBC: 5.21 MIL/uL (ref 4.22–5.81)
RDW: 14.4 % (ref 11.5–15.5)
WBC: 12.7 10*3/uL — ABNORMAL HIGH (ref 4.0–10.5)

## 2016-01-19 LAB — I-STAT CG4 LACTIC ACID, ED: Lactic Acid, Venous: 1.84 mmol/L (ref 0.5–1.9)

## 2016-01-19 LAB — URINALYSIS, ROUTINE W REFLEX MICROSCOPIC
Bilirubin Urine: NEGATIVE
Glucose, UA: NEGATIVE mg/dL
Hgb urine dipstick: NEGATIVE
Ketones, ur: NEGATIVE mg/dL
Leukocytes, UA: NEGATIVE
Nitrite: NEGATIVE
Protein, ur: NEGATIVE mg/dL
Specific Gravity, Urine: 1.027 (ref 1.005–1.030)
pH: 7 (ref 5.0–8.0)

## 2016-01-19 LAB — COMPREHENSIVE METABOLIC PANEL
ALT: 45 U/L (ref 17–63)
AST: 46 U/L — ABNORMAL HIGH (ref 15–41)
Albumin: 3.8 g/dL (ref 3.5–5.0)
Alkaline Phosphatase: 72 U/L (ref 38–126)
Anion gap: 10 (ref 5–15)
BUN: 12 mg/dL (ref 6–20)
CO2: 24 mmol/L (ref 22–32)
Calcium: 9.1 mg/dL (ref 8.9–10.3)
Chloride: 103 mmol/L (ref 101–111)
Creatinine, Ser: 1.14 mg/dL (ref 0.61–1.24)
GFR calc Af Amer: >60 mL/min (ref >60)
GFR calc non Af Amer: >60 mL/min (ref >60)
Glucose, Bld: 112 mg/dL — ABNORMAL HIGH (ref 65–99)
Potassium: 3.5 mmol/L (ref 3.5–5.1)
Sodium: 137 mmol/L (ref 135–145)
Total Bilirubin: 0.9 mg/dL (ref 0.3–1.2)
Total Protein: 7.8 g/dL (ref 6.5–8.1)

## 2016-01-19 LAB — PROTIME-INR
INR: 0.98
Prothrombin Time: 13 seconds (ref 11.4–15.2)

## 2016-01-19 LAB — LIPASE, BLOOD: Lipase: 21 U/L (ref 11–51)

## 2016-01-19 SURGERY — REPAIR, HERNIA, INCISIONAL
Anesthesia: General | Site: Abdomen

## 2016-01-19 MED ORDER — CEFAZOLIN SODIUM-DEXTROSE 2-4 GM/100ML-% IV SOLN
2.0000 g | INTRAVENOUS | Status: AC
  Administered 2016-01-19: 2 g via INTRAVENOUS
  Filled 2016-01-19: qty 100

## 2016-01-19 MED ORDER — PROPOFOL 10 MG/ML IV BOLUS
INTRAVENOUS | Status: DC | PRN
  Administered 2016-01-19: 200 mg via INTRAVENOUS

## 2016-01-19 MED ORDER — CEFAZOLIN SODIUM-DEXTROSE 2-4 GM/100ML-% IV SOLN
INTRAVENOUS | Status: AC
  Filled 2016-01-19: qty 100

## 2016-01-19 MED ORDER — DIAZEPAM 5 MG PO TABS
10.0000 mg | ORAL_TABLET | Freq: Every evening | ORAL | Status: DC | PRN
Start: 2016-01-19 — End: 2016-01-21
  Administered 2016-01-19 – 2016-01-20 (×2): 10 mg via ORAL
  Filled 2016-01-19 (×2): qty 2

## 2016-01-19 MED ORDER — TAMSULOSIN HCL 0.4 MG PO CAPS
0.4000 mg | ORAL_CAPSULE | Freq: Every evening | ORAL | Status: DC
  Administered 2016-01-20: 0.4 mg via ORAL
  Filled 2016-01-19: qty 1

## 2016-01-19 MED ORDER — ONDANSETRON 4 MG PO TBDP: 4.0000 mg | ORAL_TABLET | Freq: Four times a day (QID) | ORAL | Status: DC | PRN

## 2016-01-19 MED ORDER — LIDOCAINE 2% (20 MG/ML) 5 ML SYRINGE
INTRAMUSCULAR | Status: DC | PRN
  Administered 2016-01-19: 100 mg via INTRAVENOUS

## 2016-01-19 MED ORDER — HYDROMORPHONE HCL 1 MG/ML IJ SOLN
0.2500 mg | INTRAMUSCULAR | Status: DC | PRN
  Administered 2016-01-19 (×3): 0.5 mg via INTRAVENOUS

## 2016-01-19 MED ORDER — KCL IN DEXTROSE-NACL 20-5-0.45 MEQ/L-%-% IV SOLN
INTRAVENOUS | Status: AC
  Administered 2016-01-19: 1000 mL via INTRAVENOUS
  Filled 2016-01-19: qty 1000

## 2016-01-19 MED ORDER — ONDANSETRON HCL 4 MG/2ML IJ SOLN
INTRAMUSCULAR | Status: DC | PRN
  Administered 2016-01-19: 4 mg via INTRAVENOUS

## 2016-01-19 MED ORDER — HYDROMORPHONE HCL 1 MG/ML IJ SOLN
INTRAMUSCULAR | Status: AC
  Filled 2016-01-19: qty 1

## 2016-01-19 MED ORDER — SODIUM CHLORIDE 0.9 % IV BOLUS (SEPSIS)
1000.0000 mL | Freq: Once | INTRAVENOUS | Status: AC
  Administered 2016-01-19: 1000 mL via INTRAVENOUS

## 2016-01-19 MED ORDER — ONDANSETRON HCL 4 MG/2ML IJ SOLN
INTRAMUSCULAR | Status: AC
  Filled 2016-01-19: qty 2

## 2016-01-19 MED ORDER — MIDAZOLAM HCL 2 MG/2ML IJ SOLN
INTRAMUSCULAR | Status: AC
  Filled 2016-01-19: qty 2

## 2016-01-19 MED ORDER — MIDAZOLAM HCL 2 MG/2ML IJ SOLN
0.5000 mg | Freq: Once | INTRAMUSCULAR | Status: AC | PRN
  Administered 2016-01-19: 0.5 mg via INTRAVENOUS

## 2016-01-19 MED ORDER — MEPERIDINE HCL 50 MG/ML IJ SOLN: 6.2500 mg | INTRAMUSCULAR | Status: DC | PRN

## 2016-01-19 MED ORDER — SUGAMMADEX SODIUM 500 MG/5ML IV SOLN
INTRAVENOUS | Status: AC
  Filled 2016-01-19: qty 5

## 2016-01-19 MED ORDER — MIDAZOLAM HCL 2 MG/2ML IJ SOLN
INTRAMUSCULAR | Status: AC
  Administered 2016-01-19: 0.5 mg via INTRAVENOUS
  Filled 2016-01-19: qty 2

## 2016-01-19 MED ORDER — ATORVASTATIN CALCIUM 40 MG PO TABS
40.0000 mg | ORAL_TABLET | Freq: Every evening | ORAL | Status: DC
  Administered 2016-01-20: 40 mg via ORAL
  Filled 2016-01-19: qty 1

## 2016-01-19 MED ORDER — ENOXAPARIN SODIUM 40 MG/0.4ML ~~LOC~~ SOLN
40.0000 mg | SUBCUTANEOUS | Status: DC
  Administered 2016-01-20 – 2016-01-21 (×2): 40 mg via SUBCUTANEOUS
  Filled 2016-01-19 (×2): qty 0.4

## 2016-01-19 MED ORDER — CHLORHEXIDINE GLUCONATE 0.12 % MT SOLN
15.0000 mL | Freq: Two times a day (BID) | OROMUCOSAL | Status: DC
  Administered 2016-01-19 – 2016-01-20 (×2): 15 mL via OROMUCOSAL
  Filled 2016-01-19 (×3): qty 15

## 2016-01-19 MED ORDER — 0.9 % SODIUM CHLORIDE (POUR BTL) OPTIME
TOPICAL | Status: DC | PRN
  Administered 2016-01-19: 1000 mL

## 2016-01-19 MED ORDER — LABETALOL HCL 5 MG/ML IV SOLN
5.0000 mg | Freq: Once | INTRAVENOUS | Status: AC
  Administered 2016-01-19: 5 mg via INTRAVENOUS

## 2016-01-19 MED ORDER — TIZANIDINE HCL 4 MG PO TABS
4.0000 mg | ORAL_TABLET | Freq: Two times a day (BID) | ORAL | Status: DC | PRN
Start: 2016-01-19 — End: 2016-01-21
  Filled 2016-01-19: qty 1

## 2016-01-19 MED ORDER — METHYLNALTREXONE BROMIDE 150 MG PO TABS: 450.0000 mg | ORAL_TABLET | Freq: Every day | ORAL | Status: DC

## 2016-01-19 MED ORDER — ONDANSETRON HCL 4 MG/2ML IJ SOLN
4.0000 mg | Freq: Once | INTRAMUSCULAR | Status: AC
  Administered 2016-01-19: 4 mg via INTRAVENOUS
  Filled 2016-01-19: qty 2

## 2016-01-19 MED ORDER — MENTHOL 3 MG MT LOZG
1.0000 | LOZENGE | OROMUCOSAL | Status: DC | PRN
  Administered 2016-01-19: 3 mg via ORAL
  Filled 2016-01-19: qty 9

## 2016-01-19 MED ORDER — KCL IN DEXTROSE-NACL 20-5-0.45 MEQ/L-%-% IV SOLN
INTRAVENOUS | Status: DC
  Administered 2016-01-19: 1000 mL via INTRAVENOUS
  Administered 2016-01-20: 04:00:00 via INTRAVENOUS
  Filled 2016-01-19 (×2): qty 1000

## 2016-01-19 MED ORDER — DEXAMETHASONE SODIUM PHOSPHATE 10 MG/ML IJ SOLN
INTRAMUSCULAR | Status: DC | PRN
  Administered 2016-01-19: 10 mg via INTRAVENOUS

## 2016-01-19 MED ORDER — MORPHINE SULFATE (PF) 2 MG/ML IV SOLN
2.0000 mg | INTRAVENOUS | Status: DC | PRN
  Administered 2016-01-19 (×2): 4 mg via INTRAVENOUS
  Administered 2016-01-19: 2 mg via INTRAVENOUS
  Administered 2016-01-20 (×2): 4 mg via INTRAVENOUS
  Administered 2016-01-20: 2 mg via INTRAVENOUS
  Administered 2016-01-20: 4 mg via INTRAVENOUS
  Administered 2016-01-21: 2 mg via INTRAVENOUS
  Filled 2016-01-19 (×2): qty 2
  Filled 2016-01-19 (×2): qty 1
  Filled 2016-01-19 (×2): qty 2
  Filled 2016-01-19: qty 1
  Filled 2016-01-19: qty 2
  Filled 2016-01-19: qty 1

## 2016-01-19 MED ORDER — ROCURONIUM BROMIDE 10 MG/ML (PF) SYRINGE
PREFILLED_SYRINGE | INTRAVENOUS | Status: AC
  Filled 2016-01-19: qty 10

## 2016-01-19 MED ORDER — PROPOFOL 10 MG/ML IV BOLUS
INTRAVENOUS | Status: AC
  Filled 2016-01-19: qty 20

## 2016-01-19 MED ORDER — HYDROMORPHONE HCL 2 MG/ML IJ SOLN
INTRAMUSCULAR | Status: AC
  Filled 2016-01-19: qty 1

## 2016-01-19 MED ORDER — SUGAMMADEX SODIUM 500 MG/5ML IV SOLN
INTRAVENOUS | Status: DC | PRN
Start: 2016-01-19 — End: 2016-01-19
  Administered 2016-01-19: 250 mg via INTRAVENOUS

## 2016-01-19 MED ORDER — ROCURONIUM BROMIDE 10 MG/ML (PF) SYRINGE
PREFILLED_SYRINGE | INTRAVENOUS | Status: DC | PRN
  Administered 2016-01-19: 50 mg via INTRAVENOUS

## 2016-01-19 MED ORDER — FENTANYL CITRATE (PF) 100 MCG/2ML IJ SOLN
50.0000 ug | Freq: Once | INTRAMUSCULAR | Status: AC
Start: 2016-01-19 — End: 2016-01-19
  Administered 2016-01-19: 50 ug via INTRAVENOUS
  Filled 2016-01-19: qty 2

## 2016-01-19 MED ORDER — METOPROLOL TARTRATE 25 MG PO TABS
25.0000 mg | ORAL_TABLET | Freq: Two times a day (BID) | ORAL | Status: DC
  Administered 2016-01-19 – 2016-01-21 (×4): 25 mg via ORAL
  Filled 2016-01-19 (×4): qty 1

## 2016-01-19 MED ORDER — MIDAZOLAM HCL 5 MG/5ML IJ SOLN
INTRAMUSCULAR | Status: DC | PRN
  Administered 2016-01-19: 2 mg via INTRAVENOUS

## 2016-01-19 MED ORDER — LEVOTHYROXINE SODIUM 112 MCG PO TABS
112.0000 ug | ORAL_TABLET | Freq: Every day | ORAL | Status: DC
  Administered 2016-01-20 – 2016-01-21 (×2): 112 ug via ORAL
  Filled 2016-01-19 (×2): qty 1

## 2016-01-19 MED ORDER — AMLODIPINE BESYLATE 5 MG PO TABS
5.0000 mg | ORAL_TABLET | Freq: Every day | ORAL | Status: DC
  Administered 2016-01-20 – 2016-01-21 (×2): 5 mg via ORAL
  Filled 2016-01-19 (×2): qty 1

## 2016-01-19 MED ORDER — FENTANYL CITRATE (PF) 100 MCG/2ML IJ SOLN
INTRAMUSCULAR | Status: AC
  Filled 2016-01-19: qty 2

## 2016-01-19 MED ORDER — IOPAMIDOL (ISOVUE-300) INJECTION 61%
100.0000 mL | Freq: Once | INTRAVENOUS | Status: AC | PRN
  Administered 2016-01-19: 100 mL via INTRAVENOUS

## 2016-01-19 MED ORDER — PANTOPRAZOLE SODIUM 40 MG PO TBEC
40.0000 mg | DELAYED_RELEASE_TABLET | Freq: Every day | ORAL | Status: DC
  Administered 2016-01-20 – 2016-01-21 (×2): 40 mg via ORAL
  Filled 2016-01-19 (×2): qty 1

## 2016-01-19 MED ORDER — FENTANYL CITRATE (PF) 100 MCG/2ML IJ SOLN
50.0000 ug | Freq: Once | INTRAMUSCULAR | Status: AC
  Administered 2016-01-19: 50 ug via INTRAVENOUS
  Filled 2016-01-19: qty 2

## 2016-01-19 MED ORDER — HYDROMORPHONE HCL 1 MG/ML IJ SOLN
INTRAMUSCULAR | Status: AC
  Administered 2016-01-19: 0.5 mg via INTRAVENOUS
  Filled 2016-01-19: qty 1

## 2016-01-19 MED ORDER — LABETALOL HCL 5 MG/ML IV SOLN
INTRAVENOUS | Status: AC
  Filled 2016-01-19: qty 4

## 2016-01-19 MED ORDER — ASPIRIN EC 81 MG PO TBEC
81.0000 mg | DELAYED_RELEASE_TABLET | Freq: Every day | ORAL | Status: DC
  Administered 2016-01-20 – 2016-01-21 (×2): 81 mg via ORAL
  Filled 2016-01-19 (×2): qty 1

## 2016-01-19 MED ORDER — PHENOL 1.4 % MT LIQD
1.0000 | OROMUCOSAL | Status: DC | PRN
Start: 2016-01-19 — End: 2016-01-21
  Administered 2016-01-19: 1 via OROMUCOSAL
  Filled 2016-01-19: qty 177

## 2016-01-19 MED ORDER — OXYCODONE-ACETAMINOPHEN 10-325 MG PO TABS: 1.0000 | ORAL_TABLET | Freq: Four times a day (QID) | ORAL | Status: DC | PRN

## 2016-01-19 MED ORDER — IOPAMIDOL (ISOVUE-M 300) INJECTION 61%: 15.0000 mL | Freq: Once | INTRAMUSCULAR | Status: DC | PRN

## 2016-01-19 MED ORDER — HYDROMORPHONE HCL 1 MG/ML IJ SOLN
0.2500 mg | INTRAMUSCULAR | Status: DC | PRN
Start: 2016-01-19 — End: 2016-01-19
  Administered 2016-01-19 (×3): 0.5 mg via INTRAVENOUS

## 2016-01-19 MED ORDER — DEXAMETHASONE SODIUM PHOSPHATE 10 MG/ML IJ SOLN
INTRAMUSCULAR | Status: AC
  Filled 2016-01-19: qty 1

## 2016-01-19 MED ORDER — LIDOCAINE 2% (20 MG/ML) 5 ML SYRINGE
INTRAMUSCULAR | Status: AC
  Filled 2016-01-19: qty 5

## 2016-01-19 MED ORDER — FENTANYL CITRATE (PF) 100 MCG/2ML IJ SOLN
INTRAMUSCULAR | Status: DC | PRN
  Administered 2016-01-19: 100 ug via INTRAVENOUS
  Administered 2016-01-19 (×2): 50 ug via INTRAVENOUS

## 2016-01-19 MED ORDER — ORAL CARE MOUTH RINSE
15.0000 mL | Freq: Two times a day (BID) | OROMUCOSAL | Status: DC
  Administered 2016-01-19 – 2016-01-20 (×3): 15 mL via OROMUCOSAL

## 2016-01-19 MED ORDER — CEFAZOLIN SODIUM-DEXTROSE 2-4 GM/100ML-% IV SOLN: 2.0000 g | INTRAVENOUS | Status: DC

## 2016-01-19 MED ORDER — PROMETHAZINE HCL 25 MG/ML IJ SOLN: 6.2500 mg | INTRAMUSCULAR | Status: DC | PRN

## 2016-01-19 MED ORDER — BUPIVACAINE HCL (PF) 0.25 % IJ SOLN
INTRAMUSCULAR | Status: AC
  Filled 2016-01-19: qty 60

## 2016-01-19 MED ORDER — HYDROMORPHONE HCL 1 MG/ML IJ SOLN
INTRAMUSCULAR | Status: DC | PRN
  Administered 2016-01-19 (×2): 1 mg via INTRAVENOUS

## 2016-01-19 MED ORDER — VENLAFAXINE HCL ER 75 MG PO CP24
75.0000 mg | ORAL_CAPSULE | Freq: Every day | ORAL | Status: DC
  Administered 2016-01-20 – 2016-01-21 (×2): 75 mg via ORAL
  Filled 2016-01-19 (×2): qty 1

## 2016-01-19 MED ORDER — ACETAMINOPHEN 10 MG/ML IV SOLN
INTRAVENOUS | Status: AC
  Filled 2016-01-19: qty 100

## 2016-01-19 MED ORDER — ACETAMINOPHEN 10 MG/ML IV SOLN
1000.0000 mg | Freq: Once | INTRAVENOUS | Status: AC
  Administered 2016-01-19: 1000 mg via INTRAVENOUS

## 2016-01-19 MED ORDER — OXYCODONE HCL 5 MG PO TABS
5.0000 mg | ORAL_TABLET | Freq: Four times a day (QID) | ORAL | Status: DC | PRN
  Administered 2016-01-20: 5 mg via ORAL
  Filled 2016-01-19: qty 1

## 2016-01-19 MED ORDER — SODIUM CHLORIDE 0.9 % IV SOLN
INTRAVENOUS | Status: DC
  Administered 2016-01-19: 13:00:00 via INTRAVENOUS

## 2016-01-19 MED ORDER — BUPIVACAINE HCL 0.25 % IJ SOLN
INTRAMUSCULAR | Status: DC | PRN
  Administered 2016-01-19: 10 mL

## 2016-01-19 MED ORDER — FAMOTIDINE IN NACL 20-0.9 MG/50ML-% IV SOLN
20.0000 mg | Freq: Two times a day (BID) | INTRAVENOUS | Status: DC
Start: 2016-01-19 — End: 2016-01-21
  Administered 2016-01-19 – 2016-01-20 (×3): 20 mg via INTRAVENOUS
  Filled 2016-01-19 (×4): qty 50

## 2016-01-19 MED ORDER — ONDANSETRON HCL 4 MG/2ML IJ SOLN: 4.0000 mg | Freq: Four times a day (QID) | INTRAMUSCULAR | Status: DC | PRN

## 2016-01-19 MED ORDER — OXYCODONE-ACETAMINOPHEN 5-325 MG PO TABS
1.0000 | ORAL_TABLET | Freq: Four times a day (QID) | ORAL | Status: DC | PRN
  Administered 2016-01-20 – 2016-01-21 (×3): 1 via ORAL
  Filled 2016-01-19 (×4): qty 1

## 2016-01-19 SURGICAL SUPPLY — 36 items
BINDER ABDOMINAL 12 ML 46-62 (SOFTGOODS) ×2 IMPLANT
BLADE EXTENDED COATED 6.5IN (ELECTRODE) IMPLANT
BLADE HEX COATED 2.75 (ELECTRODE) ×3 IMPLANT
CHLORAPREP W/TINT 26ML (MISCELLANEOUS) ×3 IMPLANT
COVER SURGICAL LIGHT HANDLE (MISCELLANEOUS) ×3 IMPLANT
DRAPE LAPAROSCOPIC ABDOMINAL (DRAPES) ×3 IMPLANT
DRAPE UTILITY XL STRL (DRAPES) ×3 IMPLANT
DRSG OPSITE POSTOP 4X8 (GAUZE/BANDAGES/DRESSINGS) ×2 IMPLANT
ELECT REM PT RETURN 9FT ADLT (ELECTROSURGICAL) ×3
ELECTRODE REM PT RTRN 9FT ADLT (ELECTROSURGICAL) ×1 IMPLANT
GLOVE BIO SURGEON STRL SZ 6.5 (GLOVE) ×2 IMPLANT
GLOVE BIO SURGEON STRL SZ7 (GLOVE) ×2 IMPLANT
GLOVE BIO SURGEONS STRL SZ 6.5 (GLOVE) ×1
GLOVE BIOGEL PI IND STRL 7.0 (GLOVE) ×1 IMPLANT
GLOVE BIOGEL PI INDICATOR 7.0 (GLOVE) ×6
GLOVE SURG SS PI 7.0 STRL IVOR (GLOVE) ×2 IMPLANT
GOWN L4 XXLG W/PAP TWL (GOWN DISPOSABLE) ×3 IMPLANT
GOWN STRL REUS W/TWL XL LVL3 (GOWN DISPOSABLE) ×4 IMPLANT
KIT BASIN OR (CUSTOM PROCEDURE TRAY) ×3 IMPLANT
LIQUID BAND (GAUZE/BANDAGES/DRESSINGS) ×2 IMPLANT
NEEDLE HYPO 22GX1.5 SAFETY (NEEDLE) ×3 IMPLANT
PACK GENERAL/GYN (CUSTOM PROCEDURE TRAY) ×3 IMPLANT
STAPLER VISISTAT 35W (STAPLE) ×1 IMPLANT
SUT MNCRL AB 4-0 PS2 18 (SUTURE) ×1 IMPLANT
SUT NOVA 0 T19/GS 22DT (SUTURE) ×2 IMPLANT
SUT NOVA 1 T20/GS 25DT (SUTURE) ×2 IMPLANT
SUT NOVA NAB GS-21 0 18 T12 DT (SUTURE) ×2 IMPLANT
SUT PROLENE 0 CT 2 (SUTURE) IMPLANT
SUT VIC AB 2-0 SH 18 (SUTURE) ×2 IMPLANT
SUT VIC AB 2-0 SH 27 (SUTURE) ×3
SUT VIC AB 2-0 SH 27X BRD (SUTURE) ×1 IMPLANT
SUT VIC AB 3-0 SH 27 (SUTURE)
SUT VIC AB 3-0 SH 27X BRD (SUTURE) ×1 IMPLANT
SUT VIC AB 4-0 PS2 27 (SUTURE) ×1 IMPLANT
SYR CONTROL 10ML LL (SYRINGE) ×3 IMPLANT
TOWEL OR 17X26 10 PK STRL BLUE (TOWEL DISPOSABLE) ×3 IMPLANT

## 2016-01-19 NOTE — Anesthesia Procedure Notes (Signed)
Procedure Name: Intubation Date/Time: 01/19/2016 12:59 PM Performed by: Noralyn Pick D Pre-anesthesia Checklist: Patient identified, Emergency Drugs available, Suction available and Patient being monitored Patient Re-evaluated:Patient Re-evaluated prior to inductionOxygen Delivery Method: Circle system utilized Preoxygenation: Pre-oxygenation with 100% oxygen Intubation Type: IV induction Ventilation: Mask ventilation without difficulty Laryngoscope Size: Mac and 4 Grade View: Grade II Tube type: Oral Tube size: 7.5 mm Number of attempts: 1 Airway Equipment and Method: Stylet Placement Confirmation: ETT inserted through vocal cords under direct vision,  positive ETCO2 and breath sounds checked- equal and bilateral Secured at: 23 cm Tube secured with: Tape Dental Injury: Teeth and Oropharynx as per pre-operative assessment

## 2016-01-19 NOTE — Anesthesia Postprocedure Evaluation (Deleted)
Anesthesia Post Note  Patient: Jeff [name]  Procedure(s) Performed: Procedure(s) (LRB): HERNIA REPAIR INCISIONAL (N/A)  Patient location during evaluation: PACU Anesthesia Type: General Level of consciousness: sedated and patient cooperative Pain management: pain level controlled Vital Signs Assessment: post-procedure vital signs reviewed and stable Respiratory status: spontaneous breathing Cardiovascular status: stable Anesthetic complications: no    Last Vitals:  Vitals:   01/19/16 1430 01/19/16 1445  BP: (!) 175/97 (!) 157/103  Pulse: (!) 110 (!) 114  Resp: 17 13  Temp:      Last Pain:  Vitals:   01/19/16 1430  TempSrc:   PainSc: 10-Worst pain ever                 Nolon Nations

## 2016-01-19 NOTE — Anesthesia Postprocedure Evaluation (Signed)
Anesthesia Post Note  Patient: Jeff [name]  Procedure(s) Performed: Procedure(s) (LRB): HERNIA REPAIR INCISIONAL (N/A)  Patient location during evaluation: PACU Anesthesia Type: General Level of consciousness: sedated and patient cooperative Pain management: pain level controlled Vital Signs Assessment: post-procedure vital signs reviewed and stable Respiratory status: spontaneous breathing Cardiovascular status: stable Anesthetic complications: no    Last Vitals:  Vitals:   01/19/16 1600 01/19/16 1652  BP: 136/79 132/80  Pulse: 98 (!) 109  Resp: 16 15  Temp: 36.6 C 36.5 C    Last Pain:  Vitals:   01/19/16 1652  TempSrc: Oral  PainSc:                  Nolon Nations

## 2016-01-19 NOTE — Op Note (Signed)
01/19/2016  1:48 PM  PATIENT:  Jeff [name]  50 y.o. male  Patient Care Team: Charlynn Court, NP as PCP - General (Nurse Practitioner) Tania Ade, RN as Registered Nurse  PRE-OPERATIVE DIAGNOSIS:  FASCIAL WOUND DEHISCENCE   POST-OPERATIVE DIAGNOSIS:  FASCIAL WOUND DEHISCENCE   PROCEDURE:  SECONDARY SUTURE CLOSURE OF ABDOMINAL WALL    Surgeon(s): Leighton Ruff, MD  ASSISTANT: none   ANESTHESIA:   local and general  EBL:  Total I/O In: -  Out: 50 [Blood:50]  DRAINS: none   SPECIMEN:  No Specimen  DISPOSITION OF SPECIMEN:  PATHOLOGY  COUNTS:  YES  PLAN OF CARE: Admit for overnight observation  PATIENT DISPOSITION:  PACU - hemodynamically stable.  INDICATION: 50 y.o. M postop day 5 from robotic partial colectomy. He presents to the ER today with nausea and vomiting of acute origin. CT scan reveals an evisceration at the fascial level of his incision with obstruction. I recommended return to the operating room and primary repair.   OR FINDINGS: Open wound at fascial level with free-floating sutures.  DESCRIPTION: the patient was identified in the preoperative holding area and taken to the OR where they were laid supine on the operating room table.  General anesthesia was induced without difficulty. SCDs were also noted to be in place prior to the initiation of anesthesia.  The patient was then prepped and draped in the usual sterile fashion.   A surgical timeout was performed indicating the correct patient, procedure, positioning and need for preoperative antibiotics.   I opened his previous incision with a 10 blade scalpel. Dissection was carried down bluntly through the subcutaneous tissue. Vicryl sutures were cut as we went. I encountered small bowel which was easily reduced back into the abdomen. I evaluated the wound. A fashion was intact. Most of the sutures were floating within the wound. These were removed. I then closed the central portion of the fascia and  peritoneum using #1 Novafil sutures. The edges (anterior fascia) of the wound were closed using 0 Novafil sutures.  After this was completed the subcutaneous tissue was then reapproximated using 2-0 Vicryl sutures. The skin was closed with a running 4-0 Vicryl subcuticular suture. Dermabond was placed over the wound.

## 2016-01-19 NOTE — ED Triage Notes (Signed)
Patient reports abdominal pain, nausea, vomiting, and constipation since Sunday night. Patient had colon surgery on Thursday and discharged on Sunday.

## 2016-01-19 NOTE — ED Notes (Signed)
Pt aware of need for urine sample. Urinal left at bedside.

## 2016-01-19 NOTE — Anesthesia Preprocedure Evaluation (Signed)
Anesthesia Evaluation  Patient identified by MRN, date of birth, ID band Patient awake    Reviewed: Allergy & Precautions, NPO status , Patient's Chart, lab work & pertinent test results  Airway Mallampati: II  TM Distance: >3 FB Neck ROM: Full    Dental  (+) Missing,    Pulmonary asthma ,    Pulmonary exam normal breath sounds clear to auscultation       Cardiovascular hypertension, + Past MI and + Peripheral Vascular Disease  Normal cardiovascular exam Rhythm:Regular Rate:Normal     Neuro/Psych PSYCHIATRIC DISORDERS Anxiety Depression negative neurological ROS     GI/Hepatic Neg liver ROS, hiatal hernia, GERD  ,  Endo/Other  Hypothyroidism   Renal/GU negative Renal ROS     Musculoskeletal  (+) Arthritis ,   Abdominal (+) + obese,   Peds  Hematology negative hematology ROS (+)   Anesthesia Other Findings   Reproductive/Obstetrics negative OB ROS                             Anesthesia Physical  Anesthesia Plan  ASA: III  Anesthesia Plan: General   Post-op Pain Management:    Induction: Intravenous  Airway Management Planned: Oral ETT  Additional Equipment:   Intra-op Plan:   Post-operative Plan: Extubation in OR  Informed Consent: I have reviewed the patients History and Physical, chart, labs and discussed the procedure including the risks, benefits and alternatives for the proposed anesthesia with the patient or authorized representative who has indicated his/her understanding and acceptance.   Dental advisory given  Plan Discussed with: CRNA  Anesthesia Plan Comments:         Anesthesia Quick Evaluation

## 2016-01-19 NOTE — Transfer of Care (Signed)
Immediate Anesthesia Transfer of Care Note  Patient: Jeff [name]  Procedure(s) Performed: Procedure(s): HERNIA REPAIR INCISIONAL (N/A)  Patient Location: PACU  Anesthesia Type:General  Level of Consciousness: awake, alert  and oriented  Airway & Oxygen Therapy: Patient Spontanous Breathing and Patient connected to face mask oxygen  Post-op Assessment: Report given to RN and Post -op Vital signs reviewed and stable  Post vital signs: Reviewed and stable  Last Vitals:  Vitals:   01/19/16 1200 01/19/16 1219  BP: 120/87   Pulse: 99   Resp: 16 16  Temp: 36.6 C     Last Pain:  Vitals:   01/19/16 1200  TempSrc: Oral  PainSc:          Complications: No apparent anesthesia complications

## 2016-01-19 NOTE — H&P (Signed)
Jeff [name] is an 50 y.o. male.   Chief Complaint: Abdominal pain nausea and vomiting constipation since 01/17/16. HPI: Patient presents to the ED with the above noted complaints. Patient was admitted on 01/14/16 by Dr. [name] underwent laparoscopic sigmoid colectomy liver biopsy and insertion of a Port-A-Cath by Dr. [name]. He has a diagnosis of metastatic colon cancer. Postop he did well, he was having bowel movements and tolerating diet well and was ready for discharge. He returns this a.m. with the above noted complaints.  Workup in the emergency department shows his labs are essentially normal WBC is elevated at 12.7. A CT scan shows a partial small bowel obstruction caused by an anterior right mid abdominal hernia. There were no other acute findings and no evidence of abscess or free air. He has multiple liver metastatic lesions which have increased compared to his last CT. CT is been reviewed by Dr. [name], and we plan to admit and taken the operating room for revision this afternoon.   Past Medical History:  Diagnosis Date  . Allergic rhinitis   . Anxiety   . Arthritis   . Asthma   . At risk for sleep apnea    STOP-BANG= 6       SENT TO PCP 01-22-2015  . Chronic back pain    "from the neck to the lower back"   . 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 Depression   . Dysthymic disorder   . ED (erectile dysfunction)   . GERD (gastroesophageal reflux disease)   . History of acute pyelonephritis    01-07-2015  . History of Graves' disease    vs  Multinodular  . History of hiatal hernia   . History of kidney stones   . History of non-ST elevation myocardial infarction (NSTEMI)    Jan 2014--  no CAD;  per notes probable coronary vasospasm  . History of panic attacks   . History of septic shock    01-07-2015--  ureterolithias/ pyelonephritis  . History of thyroid storm     Nov 2011  . Hyperlipidemia   . Hypertension   . Hypothyroidism following radioiodine therapy    RAI in Mar 2012---  followed by dr Loanne Drilling  . PAD (peripheral artery disease) [facility]) cardiologist-  dr Fletcher Anon   a.  ABI (01/2014):  L 0.53; R 1.0 >> referred to [facility]  . Right ureteral stone   . Septic shock ([place]) 01/05/2015  . Sleep disturbance   . Thrombocytopenia ([place])     Past Surgical History:  Procedure Laterality Date  . ABDOMINAL AORTAGRAM N/A 02/19/2014   Procedure: ABDOMINAL Maxcine Ham;  Surgeon: Wellington Hampshire, MD;  Location: [place] [facility];  Service: Cardiovascular;  Laterality: N/A;  . ANTERIOR CERVICAL DECOMP/DISCECTOMY FUSION  09-08-2010   C3 -4  . BACK SURGERY     x5, LOWER X 3, UPPER NECK X 2  . CYSTOSCOPY W/ URETERAL STENT PLACEMENT Right 01/04/2015   Procedure: CYSTOSCOPY WITH RETROGRADE PYELOGRAM/URETERAL STENT PLACEMENT;  Surgeon: Franchot Gallo, MD;  Location: [facility];  Service: Urology;  Laterality: Right;  . CYSTOSCOPY W/ URETERAL STENT REMOVAL Right 01/26/2015   Procedure: CYSTOSCOPY WITH STENT REMOVAL;  Surgeon: Franchot Gallo, MD;  Location: [facility];  Service: Urology;  Laterality: Right;  . CYSTOSCOPY/URETEROSCOPY/HOLMIUM LASER/STENT PLACEMENT Right 01/26/2015   Procedure: CYSTOSCOPY/URETEROSCOPY RIGHT;  Surgeon: Franchot Gallo, MD;  Location: [facility];  Service: Urology;  Laterality: Right;  . FEMORAL-POPLITEAL BYPASS GRAFT Left 09/11/2015   Procedure: BYPASS GRAFT LEFT ABOVE KNEE TO BELOW KNEE POPLITEAL ARTERY WITH LEFT GREATER SAPHENOUS VEIN;  Surgeon: Serafina Mitchell, MD;  Location: [place];  Service: Vascular;  Laterality: Left;  . LAMINECTOMY AND MICRODISCECTOMY LUMBAR SPINE  03-26-2009   left L5 -- S1  . LAPAROSCOPIC SIGMOID COLECTOMY N/A 01/14/2016   Procedure: LAPAROSCOPIC SIGMOID COLECTOMY;  Surgeon: Leighton Ruff, MD;  Location: [facility];  Service: General;  Laterality: N/A;  . LEFT HEART CATHETERIZATION WITH CORONARY  ANGIOGRAM N/A 06/12/2012   Procedure: LEFT HEART CATHETERIZATION WITH CORONARY ANGIOGRAM;  Surgeon: Josue Hector, MD;  Location: [facility] C[facility];  Service: Cardiovascular;  Laterality: N/A;   No sig. CAD/  normal LVF, ef 55-65%  . LIVER BIOPSY N/A 01/14/2016   Procedure: LIVER BIOPSY;  Surgeon: Leighton Ruff, MD;  Location: [facility];  Service: General;  Laterality: N/A;  . LOWER EXTREMITY ANGIOGRAM Left 05/28/2014   Procedure: LOWER EXTREMITY ANGIOGRAM;  Surgeon: Wellington Hampshire, MD;  Location: [place] [facility];  Service: Cardiovascular;  Laterality: Left;  Failed PTA CTO  . POPLITEAL ARTERY ANGIOPLASTY Left 05/28/2014    dr Fletcher Anon   Attempted and unsuccessful due to inability to cross the occlusionnotes   . PORTACATH PLACEMENT Right 01/14/2016   Procedure: INSERTION PORT-A-CATH;  Surgeon: Leighton Ruff, MD;  Location: [facility];  Service: General;  Laterality: Right;  . POSTERIOR CERVICAL FUSION/FORAMINOTOMY N/A 12/10/2012   Procedure: POSTERIOR CERVICAL FUSION/FORAMINOTOMY LEVEL 1;  Surgeon: Ophelia Charter, MD;  Location: [place] [facility];  Service: Neurosurgery;  Laterality: N/A;  Cervical three-four posterior cervical fusion with lateral mass screws  . POSTERIOR LUMBAR FUSION  11-11-2009;   07-15-2013   L5 -- S1;   L4-- S1  . SHOULDER ARTHROSCOPY Right 03-08-2004   debridement labral tear/  DCR/  acromioplasty  . TRANSTHORACIC ECHOCARDIOGRAM  01-05-2015   mild LVH/  ef 60-65%/  mild TR  . UMBILICAL HERNIA REPAIR  1980  . VEIN HARVEST Left 09/11/2015   Procedure: LEFT GREATER SAPHENOUS VEIN HARVEST;  Surgeon: Serafina Mitchell, MD;  Location: [facility];  Service: Vascular;  Laterality: Left;    Family History  Problem Relation Age of Onset  . Thyroid disease Mother     hypothyroidism  . Heart attack Maternal Grandfather   . Heart Problems Father     pacermaker  . Edema Father   . Heart disease Maternal Grandmother   . Lung cancer Maternal Grandmother   . Diabetes Maternal Grandmother   . Hypertension  Maternal Grandmother    Social History:  reports that he has never smoked. He has never used smokeless tobacco. He reports that he drinks alcohol. He reports that he does not use drugs.  Allergies:  Allergies  Allergen Reactions  . Hydrocodone Hives    Prior to Admission medications   Medication Sig Start Date End Date Taking? Authorizing Provider  amLODipine (NORVASC) 5 MG tablet Take 1 tablet (5 mg total) by mouth daily. 01/05/16  Yes Wellington Hampshire, MD  aspirin EC 81 MG tablet Take 81 mg by mouth daily.   Yes Historical Provider, MD  atorvastatin (LIPITOR) 40 MG tablet Take 40 mg by mouth every evening.    Yes Historical Provider, MD  diazepam (VALIUM) 10 MG tablet Take 10 mg by mouth at bedtime as needed for anxiety.  08/10/15  Yes Historical Provider, MD  levothyroxine (SYNTHROID, LEVOTHROID) 112 MCG tablet  TAKE 1 TABLET(112 MCG) BY MOUTH DAILY BEFORE BREAKFAST 01/13/16  Yes Renato Shin, MD  metoprolol (LOPRESSOR) 50 MG tablet Take 0.5 tablets (25 mg total) by mouth 2 (two) times daily. 01/05/16  Yes Wellington Hampshire, MD  nitroGLYCERIN (NITROSTAT) 0.4 MG SL tablet PLACE 1 TABLET UNDER THE TONGUE EVERY 5 MINUTES AS NEEDED FOR CHEST PAIN. Patient taking differently: PLACE 0.4 MG UNDER THE TONGUE EVERY 5 MINUTES AS NEEDED FOR CHEST PAIN. 12/11/15  Yes Minna Merritts, MD  oxyCODONE-acetaminophen (PERCOCET) 10-325 MG tablet Take 1 tablet by mouth every 6 (six) hours as needed for pain (for back pain). 09/11/15  Yes Kimberly A Trinh, PA-C  pantoprazole (PROTONIX) 40 MG tablet Take 40 mg by mouth daily.  10/23/15  Yes Historical Provider, MD  polyethylene glycol (MIRALAX / GLYCOLAX) packet Take 17 g by mouth 2 (two) times daily as needed for severe constipation.   Yes Historical Provider, MD  sildenafil (REVATIO) 20 MG tablet Take 20-100 mg by mouth daily as needed for erectile dysfunction. 12/04/15  Yes Historical Provider, MD  tamsulosin (FLOMAX) 0.4 MG CAPS capsule Take 0.4 mg by mouth every  evening.    Yes Historical Provider, MD  tiZANidine (ZANAFLEX) 4 MG tablet Take 4 mg by mouth 2 (two) times daily as needed for muscle spasms.  06/01/15  Yes Historical Provider, MD  venlafaxine XR (EFFEXOR-XR) 75 MG 24 hr capsule Take 75 mg by mouth daily with breakfast.   Yes Historical Provider, MD  metroNIDAZOLE (FLAGYL) 500 MG tablet Take 1,000 mg by mouth 3 (three) times daily. Day before procedure 12/28/15   Historical Provider, MD  neomycin (MYCIFRADIN) 500 MG tablet Take 1,000 mg by mouth 3 (three) times daily. 12/28/15   Historical Provider, MD  oxyCODONE-acetaminophen (PERCOCET/ROXICET) 5-325 MG tablet Take 1-2 tablets by mouth every 4 (four) hours as needed for severe pain. Patient not taking: Reported on 01/19/2016 01/17/16   Alphonsa Overall, MD  RELISTOR 150 MG TABS Take 450 mg by mouth daily.  01/11/16   Historical Provider, MD     Results for orders placed or performed during the hospital encounter of 01/19/16 (from the past 48 hour(s))  Urinalysis, Routine w reflex microscopic     Status: None   Collection Time: 01/19/16  8:42 AM  Result Value Ref Range   Color, Urine YELLOW YELLOW   APPearance CLEAR CLEAR   Specific Gravity, Urine 1.027 1.005 - 1.030   pH 7.0 5.0 - 8.0   Glucose, UA NEGATIVE NEGATIVE mg/dL   Hgb urine dipstick NEGATIVE NEGATIVE   Bilirubin Urine NEGATIVE NEGATIVE   Ketones, ur NEGATIVE NEGATIVE mg/dL   Protein, ur NEGATIVE NEGATIVE mg/dL   Nitrite NEGATIVE NEGATIVE   Leukocytes, UA NEGATIVE NEGATIVE    Comment: MICROSCOPIC NOT DONE ON URINES WITH NEGATIVE PROTEIN, BLOOD, LEUKOCYTES, NITRITE, OR GLUCOSE <1000 mg/dL.  Lipase, blood     Status: None   Collection Time: 01/19/16  9:15 AM  Result Value Ref Range   Lipase 21 11 - 51 U/L  Comprehensive metabolic panel     Status: Abnormal   Collection Time: 01/19/16  9:15 AM  Result Value Ref Range   Sodium 137 135 - 145 mmol/L   Potassium 3.5 3.5 - 5.1 mmol/L   Chloride 103 101 - 111 mmol/L   CO2 24 22 - 32  mmol/L   Glucose, Bld 112 (H) 65 - 99 mg/dL   BUN 12 6 - 20 mg/dL   Creatinine, Ser 1.14 0.61 - 1.24 mg/dL  Calcium 9.1 8.9 - 10.3 mg/dL   Total Protein 7.8 6.5 - 8.1 g/dL   Albumin 3.8 3.5 - 5.0 g/dL   AST 46 (H) 15 - 41 U/L   ALT 45 17 - 63 U/L   Alkaline Phosphatase 72 38 - 126 U/L   Total Bilirubin 0.9 0.3 - 1.2 mg/dL   GFR calc non Af Amer >60 >60 mL/min   GFR calc Af Amer >60 >60 mL/min    Comment: (NOTE) The eGFR has been calculated using the CKD EPI equation. This calculation has not been validated in all clinical situations. eGFR's persistently <60 mL/min signify possible Chronic Kidney Disease.    Anion gap 10 5 - 15  CBC     Status: Abnormal   Collection Time: 01/19/16  9:15 AM  Result Value Ref Range   WBC 12.7 (H) 4.0 - 10.5 K/uL   RBC 5.21 4.22 - 5.81 MIL/uL   Hemoglobin 13.0 13.0 - 17.0 g/dL   HCT 41.6 39.0 - 52.0 %   MCV 79.8 78.0 - 100.0 fL   MCH 25.0 (L) 26.0 - 34.0 pg   MCHC 31.3 30.0 - 36.0 g/dL   RDW 14.4 11.5 - 15.5 %   Platelets 254 150 - 400 K/uL  Protime-INR     Status: None   Collection Time: 01/19/16  9:15 AM  Result Value Ref Range   Prothrombin Time 13.0 11.4 - 15.2 seconds   INR 0.98   I-Stat CG4 Lactic Acid, ED     Status: None   Collection Time: 01/19/16  9:21 AM  Result Value Ref Range   Lactic Acid, Venous 1.84 0.5 - 1.9 mmol/L   Ct Abdomen Pelvis W Contrast  Result Date: 01/19/2016 CLINICAL DATA:  S/p colon resection last ThursdayConstipation and nausea Unable to keep food and water downDx colon ca mets to liver 2017Chemo to start 01/2016 Hernia surgery, c spine, lumbar and power port surgery EXAM: CT ABDOMEN AND PELVIS WITH CONTRAST TECHNIQUE: Multidetector CT imaging of the abdomen and pelvis was performed using the standard protocol following bolus administration of intravenous contrast. CONTRAST:  15m ISOVUE-300 IOPAMIDOL (ISOVUE-300) INJECTION 61% COMPARISON:  12/11/2015 FINDINGS: Lung bases: No lung mass or nodule. Minor dependent  subsegmental atelectasis. Heart mildly enlarged. Hepatobiliary: There multiple low-density liver lesions consistent with metastatic disease. These are somewhat better defined on the current exam. Although subtle, several have shown a slight increase size. A mass arising from the inferior margin of the lateral segment of the left lobe currently measures 3.3 x 3.1 cm, previously 3.0 x 2.6 cm. Questionable small new lesion along the inferior margin of the posterior segment of the right lobe measures 9 mm in size. Liver remains normal in size. Normal gallbladder. No bile duct dilation. Spleen:  Normal. Pancreas:  Normal. Adrenal glands: 13 mm right adrenal mass, indeterminate, but similar to prior exams dating back to 05/28/2014. Normal left adrenal gland. Kidneys, ureters, bladder: Small focus of nondependent air within bladder. Otherwise unremarkable. Gastrointestinal: Since prior study, a colonic dissection has been performed. There is a bowel anastomosis staple line along the remaining lower sigmoid colon. Mild adjacent hazy infiltration of the pericolonic fat. The colon above this is normal caliber. There is a right mid abdomen hernia which contains a loop of ileum. Distal to this, the terminal ileum is decompressed. Proximal to this, small bowel is mildly dilated with multiple air-fluid levels. Small bowel is dilated to 3.5 mm maximum. There is no wall thickening. Stomach is unremarkable. Peritoneal/ retroperitoneal  cavities:  No free fluid.  No free air. Lymph nodes: Several prominent gastrohepatic ligament lymph nodes, largest measuring 1 cm short axis. These are stable. No other prominent lymph nodes. Vascular: Unremarkable. Musculoskeletal: Stable changes from previous posterior lumbar fusion from L4 through S1. No osteoblastic or osteolytic lesions. IMPRESSION: 1. Partial small bowel obstruction caused by an anterior right mid abdomen abdominal hernia. 2. No other acute finding.  No evidence of an abscess or  free air. 3. Multiple liver metastatic lesions mildly increased when compared to the prior CT. Electronically Signed   By: Lajean Manes M.D.   On: 01/19/2016 10:59    Review of Systems  Constitutional: Negative.   HENT: Negative.   Eyes: Negative.   Respiratory: Negative.   Cardiovascular: Negative.   Gastrointestinal: Positive for abdominal pain (noted pain with vomiting ), heartburn (burning sensation with vomiting), nausea and vomiting. Negative for constipation (small BM yesterday) and diarrhea.  Genitourinary: Negative.   Musculoskeletal: Negative.   Skin: Negative.   Neurological: Negative.   Endo/Heme/Allergies: Negative.   Psychiatric/Behavioral: Negative.     Blood pressure 133/97, pulse 99, temperature 97.9 F (36.6 C), temperature source Oral, resp. rate 16, height '5\' 10"'$  (1.778 m), weight 115.2 kg (254 lb), SpO2 96 %. Physical Exam  Constitutional: He is oriented to person, place, and time. He appears well-developed and well-nourished. No distress.  HENT:  Head: Normocephalic and atraumatic.  Nose: Nose normal.  Eyes: Right eye exhibits no discharge. Left eye exhibits no discharge. No scleral icterus.  Neck: Normal range of motion. Neck supple. No JVD present. No tracheal deviation present. No thyromegaly present.  Cardiovascular: Normal rate, regular rhythm, normal heart sounds and intact distal pulses.   No murmur heard. Respiratory: Effort normal and breath sounds normal. No respiratory distress. He has no wheezes. He has no rales. He exhibits no tenderness.  GI: Soft. He exhibits distension and mass (You can feel hernia site.). There is tenderness. There is no rebound and no guarding.  Port sites look good.  He has a area right abdomen, surgical site raised and hard, about 4 x 6 CM.  Few BS but somewhat hypoactive.    Musculoskeletal: He exhibits no edema or tenderness.  Lymphadenopathy:    He has no cervical adenopathy.  Neurological: He is alert and oriented to  person, place, and time. No cranial nerve deficit.  Skin: Skin is warm and dry. No rash noted. He is not diaphoretic. No erythema. No pallor.  Psychiatric: He has a normal mood and affect. His behavior is normal. Judgment and thought content normal.     Assessment/Plan Metastatic sigmoid colon cancer Status post laparoscopic sigmoid colectomy and liver biopsy insertion Port-A-Cath 213/08 Dr. Leighton Ruff. CAD with a history of cardiac catheterization 06/12/2012 Dr. 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 Trula Slade. Multiple neck and back surgeries - some chronic discomfort and constipation Nephrolithiasis with history of obstruction of right ureter 01/04/2015 Dr. Diona Fanti Hypothyroid BPH   Plan:  For OR later this Afternoon.  Dr. [name] will see and further discuss repair of hernia before surgery.      Lakin Romer, PA-C 01/19/2016, 11:45 AM

## 2016-01-19 NOTE — ED Provider Notes (Signed)
[facility] DEPT Provider Note   CSN: Edith Endave:[id number] Arrival date & time: 01/19/16  [id number]     History   Chief Complaint Chief Complaint  Patient presents with  . Abdominal Pain  . Emesis    HPI Jeff MONETT is a 50 y.o. male.   Abdominal Pain   This is a new problem. The current episode started yesterday. The problem occurs constantly. The problem has been gradually worsening. The pain is associated with eating and a previous surgery. The pain is located in the RLQ and RUQ. The quality of the pain is sharp. The pain is at a severity of 4/10. The pain is moderate. Associated symptoms include anorexia, nausea and vomiting. The symptoms are aggravated by eating. Nothing relieves the symptoms. Past medical history comments: recent bowel surgery on 8/31.  Emesis   Associated symptoms include abdominal pain.    Past Medical History:  Diagnosis Date  . Allergic rhinitis   . Anxiety   . Arthritis   . Asthma   . At risk for sleep apnea    STOP-BANG= 6       SENT TO PCP 01-22-2015  . Chronic back pain    "from the neck to the lower back"   . 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 Depression   . Dysthymic disorder   . ED (erectile dysfunction)   . GERD (gastroesophageal reflux disease)   . History of acute pyelonephritis    01-07-2015  . History of Graves' disease    vs  Multinodular  . History of hiatal hernia   . History of kidney stones   . History of non-ST elevation myocardial infarction (NSTEMI)    Jan 2014--  no CAD;  per notes probable coronary vasospasm  . History of panic attacks   . History of septic shock    01-07-2015--  ureterolithias/ pyelonephritis  . History of thyroid storm    Nov 2011  . Hyperlipidemia   . Hypertension   . Hypothyroidism following radioiodine therapy    RAI in Mar 2012---  followed by dr Loanne Drilling  . PAD (peripheral artery  disease) [facility]) cardiologist-  dr Fletcher Anon   a.  ABI (01/2014):  L 0.53; R 1.0 >> referred to PV  . Right ureteral stone   . Septic shock ([place]) 01/05/2015  . Sleep disturbance   . Thrombocytopenia [facility])     Patient Active Problem List   Diagnosis Date Noted  . SBO (small bowel obstruction) ([place]) 01/19/2016  . Incisional hernia 01/19/2016  . Metastatic colon cancer to liver ([place]) 01/14/2016  . Cancer of sigmoid colon ([place]) 12/18/2015  . Aftercare following surgery of the circulatory system 09/23/2015  . Left leg claudication ([place]) 09/11/2015  . Ureterolithiasis 01/04/2015  . ED (erectile dysfunction) 12/09/2014  . Hypothyroidism following radioiodine therapy 05/26/2014  . PAD (peripheral artery disease) ([place]) 02/11/2014  . Lumbar facet arthropathy 07/15/2013  . Chest pain, mid sternal 06/12/2012  . Acute non-ST segment elevation myocardial infarction ([place]) 06/11/2012  . Hyperlipidemia 06/11/2012  . 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 Hypertension 03/16/2010    Past Surgical History:  Procedure Laterality Date  . ABDOMINAL AORTAGRAM N/A 02/19/2014   Procedure: ABDOMINAL Maxcine Ham;  Surgeon: Wellington Hampshire, MD;  Location: [facility] CATH LAB;  Service: Cardiovascular;  Laterality: N/A;  . ANTERIOR CERVICAL DECOMP/DISCECTOMY FUSION  09-08-2010   C3 -4  . BACK SURGERY  x5, LOWER X 3, UPPER NECK X 2  . CYSTOSCOPY W/ URETERAL STENT PLACEMENT Right 01/04/2015   Procedure: CYSTOSCOPY WITH RETROGRADE PYELOGRAM/URETERAL STENT PLACEMENT;  Surgeon: Franchot Gallo, MD;  Location: [facility];  Service: Urology;  Laterality: Right;  . CYSTOSCOPY W/ URETERAL STENT REMOVAL Right 01/26/2015   Procedure: CYSTOSCOPY WITH STENT REMOVAL;  Surgeon: Franchot Gallo, MD;  Location: [facility];  Service: Urology;  Laterality: Right;  . CYSTOSCOPY/URETEROSCOPY/HOLMIUM LASER/STENT PLACEMENT Right 01/26/2015   Procedure: CYSTOSCOPY/URETEROSCOPY RIGHT;  Surgeon: Franchot Gallo, MD;  Location:  [facility];  Service: Urology;  Laterality: Right;  . FEMORAL-POPLITEAL BYPASS GRAFT Left 09/11/2015   Procedure: BYPASS GRAFT LEFT ABOVE KNEE TO BELOW KNEE POPLITEAL ARTERY WITH LEFT GREATER SAPHENOUS VEIN;  Surgeon: Serafina Mitchell, MD;  Location: [facility];  Service: Vascular;  Laterality: Left;  . INCISIONAL HERNIA REPAIR N/A 01/19/2016   Procedure: HERNIA REPAIR INCISIONAL;  Surgeon: Leighton Ruff, MD;  Location: [facility];  Service: General;  Laterality: N/A;  . LAMINECTOMY AND MICRODISCECTOMY LUMBAR SPINE  03-26-2009   left L5 -- S1  . LAPAROSCOPIC SIGMOID COLECTOMY N/A 01/14/2016   Procedure: LAPAROSCOPIC SIGMOID COLECTOMY;  Surgeon: Leighton Ruff, MD;  Location: [facility];  Service: General;  Laterality: N/A;  . LEFT HEART CATHETERIZATION WITH CORONARY ANGIOGRAM N/A 06/12/2012   Procedure: LEFT HEART CATHETERIZATION WITH CORONARY ANGIOGRAM;  Surgeon: Josue Hector, MD;  Location: [facility] CATH LAB;  Service: Cardiovascular;  Laterality: N/A;   No sig. CAD/  normal LVF, ef 55-65%  . LIVER BIOPSY N/A 01/14/2016   Procedure: LIVER BIOPSY;  Surgeon: Leighton Ruff, MD;  [facility];  Service: General;  Laterality: N/A;  . LOWER EXTREMITY ANGIOGRAM Left 05/28/2014   Procedure: LOWER EXTREMITY ANGIOGRAM;  Surgeon: Wellington Hampshire, MD;  Location: [facility] [place] CATH LAB;  Service: Cardiovascular;  Laterality: Left;  Failed PTA CTO  . POPLITEAL ARTERY ANGIOPLASTY Left 05/28/2014    dr Fletcher Anon   Attempted and unsuccessful due to inability to cross the occlusionnotes   . PORTACATH PLACEMENT Right 01/14/2016   Procedure: INSERTION PORT-A-CATH;  Surgeon: Leighton Ruff, MD;  [facility];  Service: General;  Laterality: Right;  . POSTERIOR CERVICAL FUSION/FORAMINOTOMY N/A 12/10/2012   Procedure: POSTERIOR CERVICAL FUSION/FORAMINOTOMY LEVEL 1;  Surgeon: Ophelia Charter, MD;  Location: [facility] NEURO ORS;  Service: Neurosurgery;  Laterality: N/A;  Cervical three-four posterior cervical fusion with lateral mass screws    . POSTERIOR LUMBAR FUSION  11-11-2009;   07-15-2013   L5 -- S1;   L4-- S1  . SHOULDER ARTHROSCOPY Right 03-08-2004   debridement labral tear/  DCR/  acromioplasty  . TRANSTHORACIC ECHOCARDIOGRAM  01-05-2015   mild LVH/  ef 60-65%/  mild TR  . UMBILICAL HERNIA REPAIR  1980  . VEIN HARVEST Left 09/11/2015   Procedure: LEFT GREATER SAPHENOUS VEIN HARVEST;  Surgeon: Serafina Mitchell, MD;  Location: [facility] OR;  Service: Vascular;  Laterality: Left;       Home Medications    Prior to Admission medications   Medication Sig Start Date End Date Taking? Authorizing Provider  amLODipine (NORVASC) 5 MG tablet Take 1 tablet (5 mg total) by mouth daily. 01/05/16  Yes Wellington Hampshire, MD  aspirin EC 81 MG tablet Take 81 mg by mouth daily.   Yes Historical Provider, MD  atorvastatin (LIPITOR) 40 MG tablet Take 40 mg by mouth every evening.    Yes Historical Provider, MD  diazepam (VALIUM) 10 MG tablet Take 10 mg by  mouth at bedtime as needed for anxiety.  08/10/15  Yes Historical Provider, MD  levothyroxine (SYNTHROID, LEVOTHROID) 112 MCG tablet TAKE 1 TABLET(112 MCG) BY MOUTH DAILY BEFORE BREAKFAST 01/13/16  Yes Renato Shin, MD  metoprolol (LOPRESSOR) 50 MG tablet Take 0.5 tablets (25 mg total) by mouth 2 (two) times daily. 01/05/16  Yes Wellington Hampshire, MD  nitroGLYCERIN (NITROSTAT) 0.4 MG SL tablet PLACE 1 TABLET UNDER THE TONGUE EVERY 5 MINUTES AS NEEDED FOR CHEST PAIN. Patient taking differently: PLACE 0.4 MG UNDER THE TONGUE EVERY 5 MINUTES AS NEEDED FOR CHEST PAIN. 12/11/15  Yes Minna Merritts, MD  oxyCODONE-acetaminophen (PERCOCET) 10-325 MG tablet Take 1 tablet by mouth every 6 (six) hours as needed for pain (for back pain). 09/11/15  Yes Kimberly A Trinh, PA-C  pantoprazole (PROTONIX) 40 MG tablet Take 40 mg by mouth daily.  10/23/15  Yes Historical Provider, MD  polyethylene glycol (MIRALAX / GLYCOLAX) packet Take 17 g by mouth 2 (two) times daily as needed for severe constipation.   Yes Historical  Provider, MD  sildenafil (REVATIO) 20 MG tablet Take 20-100 mg by mouth daily as needed for erectile dysfunction. 12/04/15  Yes Historical Provider, MD  tamsulosin (FLOMAX) 0.4 MG CAPS capsule Take 0.4 mg by mouth every evening.    Yes Historical Provider, MD  tiZANidine (ZANAFLEX) 4 MG tablet Take 4 mg by mouth 2 (two) times daily as needed for muscle spasms.  06/01/15  Yes Historical Provider, MD  venlafaxine XR (EFFEXOR-XR) 75 MG 24 hr capsule Take 75 mg by mouth daily with breakfast.   Yes Historical Provider, MD  metroNIDAZOLE (FLAGYL) 500 MG tablet Take 1,000 mg by mouth 3 (three) times daily. Day before procedure 12/28/15   Historical Provider, MD  neomycin (MYCIFRADIN) 500 MG tablet Take 1,000 mg by mouth 3 (three) times daily. 12/28/15   Historical Provider, MD  oxyCODONE-acetaminophen (PERCOCET/ROXICET) 5-325 MG tablet Take 1-2 tablets by mouth every 4 (four) hours as needed for severe pain. Patient not taking: Reported on 01/19/2016 01/17/16   Alphonsa Overall, MD  RELISTOR 150 MG TABS Take 450 mg by mouth daily.  01/11/16   Historical Provider, MD    Family History Family History  Problem Relation Age of Onset  . Thyroid disease Mother     hypothyroidism  . Heart attack Maternal Grandfather   . Heart Problems Father     pacermaker  . Edema Father   . Heart disease Maternal Grandmother   . Lung cancer Maternal Grandmother   . Diabetes Maternal Grandmother   . Hypertension Maternal Grandmother     Social History Social History  Substance Use Topics  . Smoking status: Never Smoker  . Smokeless tobacco: Never Used  . Alcohol use 0.0 oz/week     Comment: RARE     Allergies   Hydrocodone   Review of Systems Review of Systems  Constitutional: Positive for appetite change and fatigue. Negative for activity change.  Respiratory: Negative for shortness of breath.   Cardiovascular: Negative for chest pain.  Gastrointestinal: Positive for abdominal pain, anorexia, nausea and  vomiting.  All other systems reviewed and are negative.    Physical Exam Updated Vital Signs BP (!) 138/100 (BP Location: Right Arm) Comment: nurse notified  Pulse (!) 113   Temp 97.7 F (36.5 C) (Oral)   Resp 20   Ht 5\' 10"  (1.778 m)   Wt 254 lb (115.2 kg)   SpO2 93%   BMI 36.45 kg/m   Physical Exam  Constitutional: He is oriented to person, place, and time. He appears well-nourished.  HENT:  Head: Normocephalic.  Dry mucous membranes.  Eyes: Conjunctivae are normal.  Cardiovascular: Normal rate and regular rhythm.   Pulmonary/Chest: Effort normal.  Abdominal: There is tenderness.  Multiple well healing abdominal scars. Diffuse tenderness worse in the right upper and lower quadrant.  Neurological: He is oriented to person, place, and time.  Skin: Skin is warm and dry. He is not diaphoretic.  Psychiatric: He has a normal mood and affect. His behavior is normal.     ED Treatments / Results  Labs (all labs ordered are listed, but only abnormal results are displayed) Labs Reviewed  COMPREHENSIVE METABOLIC PANEL - Abnormal; Notable for the following:       Result Value   Glucose, Bld 112 (*)    AST 46 (*)    All other components within normal limits  CBC - Abnormal; Notable for the following:    WBC 12.7 (*)    MCH 25.0 (*)    All other components within normal limits  LIPASE, BLOOD  URINALYSIS, ROUTINE W REFLEX MICROSCOPIC (NOT AT [facility])  PROTIME-INR  I-STAT CG4 LACTIC ACID, ED    EKG  EKG Interpretation None       Radiology Ct Abdomen Pelvis W Contrast  Result Date: 01/19/2016 CLINICAL DATA:  S/p colon resection last ThursdayConstipation and nausea Unable to keep food and water downDx colon ca mets to liver 2017Chemo to start 01/2016 Hernia surgery, c spine, lumbar and power port surgery EXAM: CT ABDOMEN AND PELVIS WITH CONTRAST TECHNIQUE: Multidetector CT imaging of the abdomen and pelvis was performed using the standard protocol following bolus  administration of intravenous contrast. CONTRAST:  175mL ISOVUE-300 IOPAMIDOL (ISOVUE-300) INJECTION 61% COMPARISON:  12/11/2015 FINDINGS: Lung bases: No lung mass or nodule. Minor dependent subsegmental atelectasis. Heart mildly enlarged. Hepatobiliary: There multiple low-density liver lesions consistent with metastatic disease. These are somewhat better defined on the current exam. Although subtle, several have shown a slight increase size. A mass arising from the inferior margin of the lateral segment of the left lobe currently measures 3.3 x 3.1 cm, previously 3.0 x 2.6 cm. Questionable small new lesion along the inferior margin of the posterior segment of the right lobe measures 9 mm in size. Liver remains normal in size. Normal gallbladder. No bile duct dilation. Spleen:  Normal. Pancreas:  Normal. Adrenal glands: 13 mm right adrenal mass, indeterminate, but similar to prior exams dating back to 05/28/2014. Normal left adrenal gland. Kidneys, ureters, bladder: Small focus of nondependent air within bladder. Otherwise unremarkable. Gastrointestinal: Since prior study, a colonic dissection has been performed. There is a bowel anastomosis staple line along the remaining lower sigmoid colon. Mild adjacent hazy infiltration of the pericolonic fat. The colon above this is normal caliber. There is a right mid abdomen hernia which contains a loop of ileum. Distal to this, the terminal ileum is decompressed. Proximal to this, small bowel is mildly dilated with multiple air-fluid levels. Small bowel is dilated to 3.5 mm maximum. There is no wall thickening. Stomach is unremarkable. Peritoneal/ retroperitoneal cavities:  No free fluid.  No free air. Lymph nodes: Several prominent gastrohepatic ligament lymph nodes, largest measuring 1 cm short axis. These are stable. No other prominent lymph nodes. Vascular: Unremarkable. Musculoskeletal: Stable changes from previous posterior lumbar fusion from L4 through S1. No  osteoblastic or osteolytic lesions. IMPRESSION: 1. Partial small bowel obstruction caused by an anterior right mid abdomen abdominal hernia. 2. No  other acute finding.  No evidence of an abscess or free air. 3. Multiple liver metastatic lesions mildly increased when compared to the prior CT. Electronically Signed   By: Lajean Manes M.D.   On: 01/19/2016 10:59    Procedures Procedures (including critical care time)  Medications Ordered in ED Medications  iopamidol (ISOVUE-M) 61 % intrathecal injection 15 mL ( Intrathecal MAR Unhold 01/19/16 1555)  famotidine (PEPCID) IVPB 20 mg premix (20 mg Intravenous Given 01/19/16 2108)  morphine 2 MG/ML injection 2-4 mg (4 mg Intravenous Given 01/20/16 0556)  0.9 %  sodium chloride infusion ( Intravenous Anesthesia Volume Adjustment 01/19/16 1400)  Methylnaltrexone Bromide TABS 450 mg (not administered)  levothyroxine (SYNTHROID, LEVOTHROID) tablet 112 mcg (not administered)  amLODipine (NORVASC) tablet 5 mg (5 mg Oral Not Given 01/19/16 1800)  metoprolol tartrate (LOPRESSOR) tablet 25 mg (25 mg Oral Given 01/19/16 2108)  venlafaxine XR (EFFEXOR-XR) 24 hr capsule 75 mg (not administered)  pantoprazole (PROTONIX) EC tablet 40 mg (not administered)  diazepam (VALIUM) tablet 10 mg (10 mg Oral Given 01/19/16 2108)  tamsulosin (FLOMAX) capsule 0.4 mg (0.4 mg Oral Not Given 01/19/16 1800)  tiZANidine (ZANAFLEX) tablet 4 mg (not administered)  aspirin EC tablet 81 mg (not administered)  atorvastatin (LIPITOR) tablet 40 mg (40 mg Oral Not Given 01/19/16 1800)  enoxaparin (LOVENOX) injection 40 mg (not administered)  dextrose 5 % and 0.45 % NaCl with KCl 20 mEq/L infusion ( Intravenous New Bag/Given 01/20/16 0335)  ondansetron (ZOFRAN-ODT) disintegrating tablet 4 mg (not administered)    Or  ondansetron (ZOFRAN) injection 4 mg (not administered)  HYDROmorphone (DILAUDID) 1 MG/ML injection (not administered)  labetalol (NORMODYNE,TRANDATE) 5 MG/ML injection (not administered)    oxyCODONE-acetaminophen (PERCOCET/ROXICET) 5-325 MG per tablet 1 tablet (not administered)    And  oxyCODONE (Oxy IR/ROXICODONE) immediate release tablet 5 mg (not administered)  chlorhexidine (PERIDEX) 0.12 % solution 15 mL (15 mLs Mouth Rinse Given 01/19/16 2108)  MEDLINE mouth rinse (15 mLs Mouth Rinse Given 01/19/16 1930)  phenol (CHLORASEPTIC) mouth spray 1 spray (1 spray Mouth/Throat Given 01/19/16 2131)  menthol-cetylpyridinium (CEPACOL) lozenge 3 mg (3 mg Oral Given 01/19/16 2108)  sodium chloride 0.9 % bolus 1,000 mL (1,000 mLs Intravenous New Bag/Given 01/19/16 0924)  ondansetron (ZOFRAN) injection 4 mg (4 mg Intravenous Given 01/19/16 0924)  fentaNYL (SUBLIMAZE) injection 50 mcg (50 mcg Intravenous Given 01/19/16 0924)  iopamidol (ISOVUE-300) 61 % injection 100 mL (100 mLs Intravenous Contrast Given 01/19/16 1029)  fentaNYL (SUBLIMAZE) injection 50 mcg (50 mcg Intravenous Given 01/19/16 1107)  sodium chloride 0.9 % bolus 1,000 mL (0 mLs Intravenous Stopped 01/19/16 1219)  ceFAZolin (ANCEF) IVPB 2g/100 mL premix ( Intravenous MAR Unhold 01/19/16 1555)  acetaminophen (OFIRMEV) IV 1,000 mg (1,000 mg Intravenous Given 01/19/16 1441)  midazolam (VERSED) injection 0.5-2 mg (0.5 mg Intravenous Given 01/19/16 1508)  labetalol (NORMODYNE,TRANDATE) injection 5 mg (5 mg Intravenous Given 01/19/16 1513)     Initial Impression / Assessment and Plan / ED Course  I have reviewed the triage vital signs and the nursing notes.  Pertinent labs & imaging results that were available during my care of the patient were reviewed by me and considered in my medical decision making (see chart for details).  Clinical Course   50 y.o. male who was admitted 01/14/2016 with a chief complaint of metastatic sigmoid colon cancer.   He was brought to the operating room on 01/14/2016 and underwent Lap sigmoid colectomy, liver biopsy ans placement Port-A-Cath. Patient reports that he been  eating well, taking food in the hospital. He is  discharged in the morning. Sunday evening he started having abdominal pain all day Monday he had vomiting, has not passed any gas, and has abdominal pain. Concern today for bowel instruction. We'll get labs, CT abdomen pelvis.  Partial Bowel obstruction noted. Surgery called, they will come see patient.   Final Clinical Impressions(s) / ED Diagnoses   Final diagnoses:  None    New Prescriptions Current Discharge Medication List       Karla Vines Julio Alm, MD 01/20/16 [phone]

## 2016-01-20 ENCOUNTER — Other Ambulatory Visit (HOSPITAL_COMMUNITY): Payer: Medicaid Other

## 2016-01-20 DIAGNOSIS — Z801 Family history of malignant neoplasm of trachea, bronchus and lung: Secondary | ICD-10-CM | POA: Diagnosis not present

## 2016-01-20 DIAGNOSIS — Z7982 Long term (current) use of aspirin: Secondary | ICD-10-CM | POA: Diagnosis not present

## 2016-01-20 DIAGNOSIS — I10 Essential (primary) hypertension: Secondary | ICD-10-CM | POA: Diagnosis present

## 2016-01-20 DIAGNOSIS — K430 Incisional hernia with obstruction, without gangrene: Secondary | ICD-10-CM | POA: Diagnosis present

## 2016-01-20 DIAGNOSIS — Z833 Family history of diabetes mellitus: Secondary | ICD-10-CM | POA: Diagnosis not present

## 2016-01-20 DIAGNOSIS — I739 Peripheral vascular disease, unspecified: Secondary | ICD-10-CM | POA: Diagnosis present

## 2016-01-20 DIAGNOSIS — N400 Enlarged prostate without lower urinary tract symptoms: Secondary | ICD-10-CM | POA: Diagnosis present

## 2016-01-20 DIAGNOSIS — T8132XA Disruption of internal operation (surgical) wound, not elsewhere classified, initial encounter: Secondary | ICD-10-CM | POA: Diagnosis not present

## 2016-01-20 DIAGNOSIS — F419 Anxiety disorder, unspecified: Secondary | ICD-10-CM | POA: Diagnosis present

## 2016-01-20 DIAGNOSIS — Z8249 Family history of ischemic heart disease and other diseases of the circulatory system: Secondary | ICD-10-CM | POA: Diagnosis not present

## 2016-01-20 DIAGNOSIS — C787 Secondary malignant neoplasm of liver and intrahepatic bile duct: Secondary | ICD-10-CM | POA: Diagnosis present

## 2016-01-20 DIAGNOSIS — E039 Hypothyroidism, unspecified: Secondary | ICD-10-CM | POA: Diagnosis present

## 2016-01-20 DIAGNOSIS — J45909 Unspecified asthma, uncomplicated: Secondary | ICD-10-CM | POA: Diagnosis present

## 2016-01-20 DIAGNOSIS — N135 Crossing vessel and stricture of ureter without hydronephrosis: Secondary | ICD-10-CM | POA: Diagnosis present

## 2016-01-20 DIAGNOSIS — N200 Calculus of kidney: Secondary | ICD-10-CM | POA: Diagnosis present

## 2016-01-20 DIAGNOSIS — C187 Malignant neoplasm of sigmoid colon: Secondary | ICD-10-CM | POA: Diagnosis present

## 2016-01-20 DIAGNOSIS — F341 Dysthymic disorder: Secondary | ICD-10-CM | POA: Diagnosis present

## 2016-01-20 DIAGNOSIS — R1031 Right lower quadrant pain: Secondary | ICD-10-CM | POA: Diagnosis not present

## 2016-01-20 DIAGNOSIS — Z885 Allergy status to narcotic agent status: Secondary | ICD-10-CM | POA: Diagnosis not present

## 2016-01-20 DIAGNOSIS — I2510 Atherosclerotic heart disease of native coronary artery without angina pectoris: Secondary | ICD-10-CM | POA: Diagnosis present

## 2016-01-20 DIAGNOSIS — F329 Major depressive disorder, single episode, unspecified: Secondary | ICD-10-CM | POA: Diagnosis present

## 2016-01-20 DIAGNOSIS — Y836 Removal of other organ (partial) (total) as the cause of abnormal reaction of the patient, or of later complication, without mention of misadventure at the time of the procedure: Secondary | ICD-10-CM | POA: Diagnosis present

## 2016-01-20 DIAGNOSIS — I252 Old myocardial infarction: Secondary | ICD-10-CM | POA: Diagnosis not present

## 2016-01-20 DIAGNOSIS — Z79899 Other long term (current) drug therapy: Secondary | ICD-10-CM | POA: Diagnosis not present

## 2016-01-20 NOTE — Discharge Instructions (Addendum)
HERNIA REPAIR: POST OP INSTRUCTIONS  1. DIET: Follow a light bland diet the first 24 hours after arrival home, such as soup, liquids, crackers, etc.  Be sure to include lots of fluids daily.  Avoid fast food or heavy meals as your are more likely to get nauseated.  Eat a low fat the next few days after surgery. 2. Take your usually prescribed home medications unless otherwise directed. 3. PAIN CONTROL: a. Pain is best controlled by a usual combination of three different methods TOGETHER: i. Ice/Heat ii. Over the counter pain medication iii. Prescription pain medication b. Most patients will experience some swelling and bruising around the hernia(s) such as the bellybutton, groins, or old incisions.  Ice packs or heating pads (30-60 minutes up to 6 times a day) will help. Use ice for the first few days to help decrease swelling and bruising, then switch to heat to help relax tight/sore spots and speed recovery.  Some people prefer to use ice alone, heat alone, alternating between ice & heat.  Experiment to what works for you.  Swelling and bruising can take several weeks to resolve.   c. It is helpful to take an over-the-counter pain medication regularly for the first few weeks.  Choose one of the following that works best for you: i. Naproxen (Aleve, etc)  Two 220mg  tabs twice a day ii. Ibuprofen (Advil, etc) Three 200mg  tabs four times a day (every meal & bedtime) d. A  prescription for pain medication should be given to you upon discharge.  Take your pain medication as prescribed.  i. If you are having problems/concerns with the prescription medicine (does not control pain, nausea, vomiting, rash, itching, etc), please call us [phone] to see if we need to switch you to a different pain medicine that will work better for you and/or control your side effect better. ii. If you need a refill on your pain medication, please contact your pharmacy.  They will contact our office to request  authorization. Prescriptions will not be filled after 5 pm or on week-ends. 4. Avoid getting constipated.  Between the surgery and the pain medications, it is common to experience some constipation.  Increasing fluid intake and taking a fiber supplement (such as Metamucil, Citrucel, FiberCon, MiraLax, etc) 1-2 times a day regularly will usually help prevent this problem from occurring.  A mild laxative (prune juice, Milk of Magnesia, MiraLax, etc) should be taken according to package directions if there are no bowel movements after 48 hours.   5. Wash / shower every day.  You may shower over the dressings as they are waterproof.   6.  You may leave the incision open to air.  You may replace a dressing/Band-Aid to cover the incision for comfort if you wish.  Continue to shower over incision(s). 7. ACTIVITIES as tolerated:   a. You may resume regular (light) daily activities beginning the next day--such as daily self-care, walking, climbing stairs--gradually increasing activities as tolerated.  If you can walk 30 minutes without difficulty, it is safe to try more intense activity such as jogging, treadmill, bicycling, low-impact aerobics, swimming, etc. b. Save the most intensive and strenuous activity for last such as sit-ups, heavy lifting, contact sports, etc  Refrain from any heavy lifting or straining until you are off narcotics for pain control.   c. DO NOT PUSH THROUGH PAIN.  Let pain be your guide: If it hurts to do something, don't do it.  Pain is your body warning you to  avoid that activity for another week until the pain goes down. d. You may drive when you are no longer taking prescription pain medication, you can comfortably wear a seatbelt, and you can safely maneuver your car and apply brakes. e. Dennis Bast may have sexual intercourse when it is comfortable.  8. FOLLOW UP in our office a. Please call [facility] at [phone] to set up an appointment to see your surgeon in the office for a follow-up  appointment approximately 2-3 weeks after your surgery. b. Make sure that you call for this appointment the day you arrive home to insure a convenient appointment time. 9.  IF YOU HAVE DISABILITY OR FAMILY LEAVE FORMS, BRING THEM TO THE OFFICE FOR PROCESSING.  DO NOT GIVE THEM TO YOUR DOCTOR.  WHEN TO CALL us [phone]: 1. Poor pain control 2. Reactions / problems with new medications (rash/itching, nausea, etc)  3. Fever over 101.5 F (38.5 C) 4. Inability to urinate 5. Nausea and/or vomiting 6. Worsening swelling or bruising 7. Continued bleeding from incision. 8. Increased pain, redness, or drainage from the incision   The clinic staff is available to answer your questions during regular business hours (8:30am-5pm).  Please dont hesitate to call and ask to speak to one of our nurses for clinical concerns.   If you have a medical emergency, go to the nearest emergency room or call 911.  A surgeon from [facility] is always on call at the hospitals in [facility], [place], [place], [place], [place]  [address] ?  [address] [address], [place],    [address] MAIN: [phone] ? TOLL FREE: [phone] ? FAX: [phone] [email or web address]

## 2016-01-20 NOTE — Progress Notes (Signed)
[name] Office:  [phone] General Surgery Progress Note   LOS: 0 days  POD -  1 Day Post-Op secondary closure of abd wall fascia  Assessment/Plan: 1.  LAPAROSCOPIC SIGMOID COLECTOMY, LIVER BIOPSY,  INSERTION PORT-A-CATH - 01/14/2016 - A. Shane Badeaux  WBC - 10,300 - 01/17/2016  2. CAD - had cath by Dr. Edmonia [name] 06/12/2012 HIstory of MI about 2103 Echo - 01/05/2015 - EF - 60% Followed by Dr. Rod [name] 3. Multiple back and neck operations Last surgery by Dr. Orinda [name] - 12/10/2012 - C3-C4 arthrodesis He says that he has had 5 back/neck operations 4. Nephrolithiasis Obstructed right ureter - 01/04/2015 - Jeff [name] 5. PVD - left leg bypass - 09/11/2015 - Jeff [name]   6.  DVT prophylaxis - Lovenox   Active Problems:   SBO (small bowel obstruction) (HCC)   Incisional hernia  Subjective:  He had a rough night with the NG.  Only putting clear fluid out.  Objective:   Vitals:   01/19/16 2059 01/20/16 0607  BP: (!) 151/95 (!) 138/100  Pulse: (!) 115 (!) 113  Resp: 18 20  Temp: 97.9 F (36.6 Jeff) 97.7 F (36.5 Jeff)     Intake/Output from previous day:  09/05 0701 - 09/06 0700 In: 2410 [I.V.:1800; NG/GT:360; IV Piggyback:50] Out: 2380 [Urine:1925; Emesis/NG output:405; Blood:50]  Intake/Output this shift:  No intake/output data recorded.   Physical Exam:   General: WN AA M who is alert and oriented.    HEENT: Normal. Pupils equal. .   Lungs: Clear.   Abdomen: soft.   Wound: Incisions look good.     Lab Results:     Recent Labs  01/19/16 0915  WBC 12.7*  HGB 13.0  HCT 41.6  PLT 254    BMET    Recent Labs  01/19/16 0915  NA 137  K 3.5  CL 103  CO2 24  GLUCOSE 112*  BUN 12  CREATININE 1.14  CALCIUM 9.1    PT/INR    Recent Labs  01/19/16 0915  LABPROT 13.0  INR 0.98    ABG  No results for input(s): PHART, HCO3 in the last 72 hours.  Invalid input(s): PCO2, PO2   Studies/Results:  Ct Abdomen  Pelvis W Contrast  Result Date: 01/19/2016 CLINICAL DATA:  S/p colon resection last ThursdayConstipation and nausea Unable to keep food and water downDx colon ca mets to liver 2017Chemo to start 01/2016 Hernia surgery, Jeff spine, lumbar and power port surgery EXAM: CT ABDOMEN AND PELVIS WITH CONTRAST TECHNIQUE: Multidetector CT imaging of the abdomen and pelvis was performed using the standard protocol following bolus administration of intravenous contrast. CONTRAST:  181mL ISOVUE-300 IOPAMIDOL (ISOVUE-300) INJECTION 61% COMPARISON:  12/11/2015 FINDINGS: Lung bases: No lung mass or nodule. Minor dependent subsegmental atelectasis. Heart mildly enlarged. Hepatobiliary: There multiple low-density liver lesions consistent with metastatic disease. These are somewhat better defined on the current exam. Although subtle, several have shown a slight increase size. A mass arising from the inferior margin of the lateral segment of the left lobe currently measures 3.3 x 3.1 cm, previously 3.0 x 2.6 cm. Questionable small new lesion along the inferior margin of the posterior segment of the right lobe measures 9 mm in size. Liver remains normal in size. Normal gallbladder. No bile duct dilation. Spleen:  Normal. Pancreas:  Normal. Adrenal glands: 13 mm right adrenal mass, indeterminate, but similar to prior exams dating back to 05/28/2014. Normal left adrenal gland. Kidneys, ureters, bladder: Small focus of nondependent air within  bladder. Otherwise unremarkable. Gastrointestinal: Since prior study, a colonic dissection has been performed. There is a bowel anastomosis staple line along the remaining lower sigmoid colon. Mild adjacent hazy infiltration of the pericolonic fat. The colon above this is normal caliber. There is a right mid abdomen hernia which contains a loop of ileum. Distal to this, the terminal ileum is decompressed. Proximal to this, small bowel is mildly dilated with multiple air-fluid levels. Small bowel is  dilated to 3.5 mm maximum. There is no wall thickening. Stomach is unremarkable. Peritoneal/ retroperitoneal cavities:  No free fluid.  No free air. Lymph nodes: Several prominent gastrohepatic ligament lymph nodes, largest measuring 1 cm short axis. These are stable. No other prominent lymph nodes. Vascular: Unremarkable. Musculoskeletal: Stable changes from previous posterior lumbar fusion from L4 through S1. No osteoblastic or osteolytic lesions. IMPRESSION: 1. Partial small bowel obstruction caused by an anterior right mid abdomen abdominal hernia. 2. No other acute finding.  No evidence of an abscess or free air. 3. Multiple liver metastatic lesions mildly increased when compared to the prior CT. Electronically Signed   By: Jeff [name] M.D.   On: 01/19/2016 10:59     Anti-infectives:   Anti-infectives    Start     Dose/Rate Route Frequency Ordered Stop   01/20/16 0600  ceFAZolin (ANCEF) IVPB 2g/100 mL premix  Status:  Discontinued     2 g 200 mL/hr over 30 Minutes Intravenous On call to O.R. 01/19/16 1250 01/19/16 1250   01/19/16 1230  ceFAZolin (ANCEF) IVPB 2g/100 mL premix     2 g 200 mL/hr over 30 Minutes Intravenous On call to O.R. 01/19/16 1217 [id number]     Jeff [name] Jeff Caralyn Twining, MD  Colorectal and [facility]

## 2016-01-21 ENCOUNTER — Telehealth: Payer: Self-pay | Admitting: Endocrinology

## 2016-01-21 NOTE — Progress Notes (Signed)
Pt's vitals are WNL, tolerating diet and pain is under control. Discussed discharge instructions with both patient and wife. Discharged to home.

## 2016-01-21 NOTE — Discharge Summary (Signed)
Physician Discharge Summary  Patient ID: NELLO PROKOP MRN: [id number] DOB/AGE: 1965-12-31 50 y.o.  Admit date: 01/19/2016 Discharge date: 01/21/2016  Admission Diagnoses: fascial dehiscence   Discharge Diagnoses:  same   Discharged Condition: good  Hospital Course: Patient admitted after urgent surgery to close a fascial defect created post op after a patient coughed.  Pt did well afterwards and diet was advanced.  He was sent home in stable condition on POD 2.    Consults: None  Significant Diagnostic Studies: labs: cbc, chemistry and radiology: CT scan: SBO and hernia  Treatments: IV hydration, analgesia: Vicodin and surgery: abd wall closure  Discharge Exam: Blood pressure 118/60, pulse 76, temperature 97.8 F (36.6 C), temperature source Oral, resp. rate 18, height 5\' 10"  (1.778 m), weight 115.2 kg (254 lb), SpO2 100 %. General appearance: alert and cooperative GI: soft, non-distended Incision/Wound: clean, dry  Disposition: 01-Home or Self Care     Medication List    STOP taking these medications   metroNIDAZOLE 500 MG tablet Commonly known as:  FLAGYL   neomycin 500 MG tablet Commonly known as:  MYCIFRADIN   polyethylene glycol packet Commonly known as:  MIRALAX / GLYCOLAX     TAKE these medications   amLODipine 5 MG tablet Commonly known as:  NORVASC Take 1 tablet (5 mg total) by mouth daily.   aspirin EC 81 MG tablet Take 81 mg by mouth daily.   atorvastatin 40 MG tablet Commonly known as:  LIPITOR Take 40 mg by mouth every evening.   diazepam 10 MG tablet Commonly known as:  VALIUM Take 10 mg by mouth at bedtime as needed for anxiety.   levothyroxine 112 MCG tablet Commonly known as:  SYNTHROID, LEVOTHROID TAKE 1 TABLET(112 MCG) BY MOUTH DAILY BEFORE BREAKFAST   metoprolol 50 MG tablet Commonly known as:  LOPRESSOR Take 0.5 tablets (25 mg total) by mouth 2 (two) times daily.   nitroGLYCERIN 0.4 MG SL tablet Commonly known as:   NITROSTAT PLACE 1 TABLET UNDER THE TONGUE EVERY 5 MINUTES AS NEEDED FOR CHEST PAIN. What changed:  See the new instructions.   oxyCODONE-acetaminophen 10-325 MG tablet Commonly known as:  PERCOCET Take 1 tablet by mouth every 6 (six) hours as needed for pain (for back pain).   oxyCODONE-acetaminophen 5-325 MG tablet Commonly known as:  PERCOCET/ROXICET Take 1-2 tablets by mouth every 4 (four) hours as needed for severe pain.   pantoprazole 40 MG tablet Commonly known as:  PROTONIX Take 40 mg by mouth daily.   RELISTOR 150 MG Tabs Generic drug:  Methylnaltrexone Bromide Take 450 mg by mouth daily.   sildenafil 20 MG tablet Commonly known as:  REVATIO Take 20-100 mg by mouth daily as needed for erectile dysfunction.   tamsulosin 0.4 MG Caps capsule Commonly known as:  FLOMAX Take 0.4 mg by mouth every evening.   tiZANidine 4 MG tablet Commonly known as:  ZANAFLEX Take 4 mg by mouth 2 (two) times daily as needed for muscle spasms.   venlafaxine XR 75 MG 24 hr capsule Commonly known as:  EFFEXOR-XR Take 75 mg by mouth daily with breakfast.      Follow-up Information    Rosario Adie., MD. Schedule an appointment as soon as possible for a visit in 2 week(s).   Specialty:  General Surgery Contact information: [address] [place] [address] [phone]           Signed: Rosario Adie [id number], [id number] AM

## 2016-01-27 ENCOUNTER — Encounter: Payer: Self-pay | Admitting: *Deleted

## 2016-01-27 DIAGNOSIS — C189 Malignant neoplasm of colon, unspecified: Secondary | ICD-10-CM

## 2016-01-27 DIAGNOSIS — C787 Secondary malignant neoplasm of liver and intrahepatic bile duct: Secondary | ICD-10-CM

## 2016-01-27 DIAGNOSIS — C187 Malignant neoplasm of sigmoid colon: Secondary | ICD-10-CM

## 2016-01-27 NOTE — Progress Notes (Signed)
Oncology Nurse Navigator Documentation  Oncology Nurse Navigator Flowsheets 01/27/2016  Navigator Location [facility]  Navigator Encounter Type Letter/Fax/Email  Telephone -  Abnormal Finding Date -  Confirmed Diagnosis Date -  Patient Visit Type -  Treatment Phase Pre-Tx/Tx Discussion  Barriers/Navigation Needs Coordination of Care--per tumor board discussion needs genetics and Foundation One  Education -  Interventions Coordination of Care;Referrals  Referrals Genetics  Coordination of Care Other--Requested Foundation One testing on surgical path  Support Groups/Services -  Acuity Level 2  Time Spent with Patient 15  Email to pathology for Foundation One on following case: Patient: Jeff [name], Jeff [name] Collected: 01/14/2016 Client: [facility] Accession: [id number] Received: 123456 Leighton Ruff, MD DOB: Mar 08, 1966 Age: 31 Gender: M Reported: 01/19/2016 [place] Patient Ph: [phone] MRN #: [id number] [place], [place] [address] Visit #: [id number] Chart #: Phone: [phone] Fax:  REPORT

## 2016-01-28 ENCOUNTER — Ambulatory Visit (HOSPITAL_BASED_OUTPATIENT_CLINIC_OR_DEPARTMENT_OTHER): Payer: Medicaid Other | Admitting: Nurse Practitioner

## 2016-01-28 ENCOUNTER — Encounter: Payer: Self-pay | Admitting: *Deleted

## 2016-01-28 ENCOUNTER — Telehealth: Payer: Self-pay | Admitting: Oncology

## 2016-01-28 VITALS — BP 130/76 | HR 94 | Temp 98.2°F | Resp 20 | Ht 70.0 in | Wt 246.8 lb

## 2016-01-28 DIAGNOSIS — R911 Solitary pulmonary nodule: Secondary | ICD-10-CM | POA: Diagnosis not present

## 2016-01-28 DIAGNOSIS — C187 Malignant neoplasm of sigmoid colon: Secondary | ICD-10-CM

## 2016-01-28 DIAGNOSIS — M545 Low back pain: Secondary | ICD-10-CM | POA: Diagnosis not present

## 2016-01-28 DIAGNOSIS — C189 Malignant neoplasm of colon, unspecified: Secondary | ICD-10-CM

## 2016-01-28 DIAGNOSIS — C787 Secondary malignant neoplasm of liver and intrahepatic bile duct: Secondary | ICD-10-CM

## 2016-01-28 DIAGNOSIS — I10 Essential (primary) hypertension: Secondary | ICD-10-CM | POA: Diagnosis not present

## 2016-01-28 MED ORDER — PROCHLORPERAZINE MALEATE 10 MG PO TABS: 10.0000 mg | ORAL_TABLET | Freq: Four times a day (QID) | ORAL | 3 refills | Status: DC | PRN

## 2016-01-28 MED ORDER — LIDOCAINE-PRILOCAINE 2.5-2.5 % EX CREA
1.0000 | TOPICAL_CREAM | CUTANEOUS | 3 refills | Status: DC | PRN
Start: 2016-01-28 — End: 2017-01-26

## 2016-01-28 NOTE — Telephone Encounter (Signed)
Gave patient avs repirt and appointments for September and October

## 2016-01-28 NOTE — Progress Notes (Signed)
START ON PATHWAY REGIMEN - Colorectal  [id number]: mFOLFOX6 q14 Days   A cycle is every 14 days:     Oxaliplatin (Eloxatin(R)) 85 mg/m2 in 250 mL D5W IV over 2 hours day 1, q14 days Dose Mod: None     Leucovorin 400 mg/m2 in 250 mL D5W IV over 2 hours day 1, q14 days, followed immediately by Dose Mod: None     5-Fluorouracil 400 mg/m2 IV bolus over 2-4 minutes day 1, q14 days Dose Mod: None     5-Fluorouracil 2,400 mg/m2 in _____mL NS CIV as a 46 hour infusion starting on day 1, q14 days Dose Mod: None Additional Orders: **Note: order sheet contains two q14 day cycles**  **Always confirm dose/schedule in your pharmacy ordering system**    Patient Characteristics: Metastatic Colorectal, First Line, Nonsurgical Candidate, KRAS Mutation Positive/Unknown, BRAF Wild-Type/Unknown, PS = 0,1; Bevacizumab Ineligible AJCC T Stage: 3 AJCC N Stage: 2b AJCC Stage Grouping: IVA AJCC M Stage: 1a Current evidence of distant metastases? Yes BRAF Mutation Status: Did Not Order Test KRAS/NRAS Mutation Status: Awaiting Test Results Line of therapy: First Line Would you be surprised if this patient died  in the next year? I would be surprised if this patient died in the next year Performance Status: PS = 0, 1 Bevacizumab Eligibility: Ineligible  Intent of Therapy: Non-Curative / Palliative Intent, Discussed with Patient

## 2016-01-28 NOTE — Progress Notes (Addendum)
[facility] OFFICE PROGRESS NOTE   Diagnosis:  Colon cancer  INTERVAL HISTORY:   Jeff [name] returns as scheduled. He is recovering from the recent surgeries. He has pain around the surgical incision. He is wearing an abdominal binder. He takes Percocet as needed for the surgical pain as well as chronic back pain. He denies nausea/vomiting. Bowels are moving. Appetite is improving.  Objective:  Vital signs in last 24 hours:  Blood pressure 130/76, pulse 94, temperature 98.2 F (36.8 C), temperature source Oral, resp. rate 20, height '5\' 10"'$  (1.778 m), weight 246 lb 12.8 oz (111.9 kg), SpO2 100 %.    HEENT: No thrush or ulcers. Resp: Lungs clear bilaterally. Cardio: Regular rate and rhythm. GI: Abdomen is soft. Tender around the surgical incision at the right abdomen. No hepatomegaly. Vascular: No leg edema. Left lower leg appears slightly larger than the right lower leg.  Port-A-Cath without erythema.  Lab Results:  Lab Results  Component Value Date   WBC 12.7 (H) 01/19/2016   HGB 13.0 01/19/2016   HCT 41.6 01/19/2016   MCV 79.8 01/19/2016   PLT 254 01/19/2016   NEUTROABS 3.5 12/08/2015    Imaging:  No results found.  Medications: I have reviewed the patient's current medications.  Assessment/Plan: 1. 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 CTs chest, abdomen, and pelvis on 12/11/2015-indeterminate tiny pulmonary nodules, multiple liver metastases, small nodes in the sigmoid mesocolon ? Laparoscopic sigmoid colectomy, liver biopsy, Port-A-Cath placement 01/14/2016 ? Pathology sigmoid colon resection- colonic adenocarcinoma, 5.3 cm extending into pericolonic connective tissue, positive lymph-vascular invasion, positive perineural invasion, negative margins, metastatic carcinoma in 9 of 28 lymph nodes ? Pathology liver biopsy-metastatic colorectal adenocarcinoma ? MSI  stable; mismatch repair protein normal  2.   Rectal bleeding and constipation secondary to #1  3.   History of peripheral vascular disease, status post left lower extremity vascular bypass surgery in April 2017  4.   History of nephrolithiasis  5.   History of Graves' disease treated with radioactive iodine  6.   Anxiety/depression  7.   Hypertension  8.   Hospitalization 01/19/2016 with wound dehiscence status post secondary suture closure of abdominal wall   Disposition: [name] appears stable. He is recovering from the recent surgeries. Dr. Benay Spice reviewed the pathology report with [name] and his wife at today's visit. They understand he has metastatic colon cancer. Dr. Benay Spice recommends initiating treatment on the FOLFOX regimen. We reviewed potential toxicities associated with chemotherapy including myelosuppression, nausea, hair loss, allergic reaction. We discussed potential toxicities associated with 5-fluorouracil including mouth sores, diarrhea, hand-foot syndrome, rash, skin hyperpigmentation, conjunctivitis. We reviewed potential toxicities associated with oxaliplatin including cold sensitivity, peripheral neuropathy, acute laryngopharyngeal dysesthesia and more rare occurrences such as diplopia, ataxia. He is agreeable to proceed. He will attend a chemotherapy education class on 01/29/2016. He will return to begin cycle 1 FOLFOX on 02/01/2016. We will see him prior to cycle 2 on 02/15/2016.  Prescriptions were sent to his pharmacy for Emla cream and Compazine 10 mg every 6 hours as needed.  Patient seen with Dr. Benay Spice. 25 minutes were spent face-to-face at today's visit with the majority of that time involved in counseling/coordination of care.  Ned Card ANP/GNP-BC   01/28/2016  4:07 PM  This was a shared visit with Ned Card. Jeff [name] was interviewed and examined. He has been diagnosed with metastatic colon cancer. We discussed treatment options  with Mr. Schiano and his wife. The plan is to begin FOLFOX chemotherapy. We may consider adding biologic therapy pending the Foundation 1 results.  The plan is to begin FOLFOX on 02/01/2016. We reviewed the potential toxicities associated with the FOLFOX regimen and he agrees to proceed.  Julieanne Manson, M.D.

## 2016-01-28 NOTE — Progress Notes (Signed)
Oncology Nurse Navigator Documentation  Oncology Nurse Navigator Flowsheets 01/28/2016  Navigator Location [facility]  Navigator Encounter Type Follow-up Appt  Telephone -  Abnormal Finding Date -  Confirmed Diagnosis Date -  Surgery Date 01/14/2016  Patient Visit Type MedOnc  Treatment Phase Pre-Tx/Tx Discussion  Barriers/Navigation Needs Coordination of Care--chemo class  Education -  Interventions Coordination of Care  Referrals -  Coordination of Care Chemo class scheduled for 01/29/16   Support Groups/Services -  Acuity -  Time Spent with Patient 15

## 2016-01-29 ENCOUNTER — Other Ambulatory Visit: Payer: Medicaid Other

## 2016-01-29 ENCOUNTER — Other Ambulatory Visit (HOSPITAL_BASED_OUTPATIENT_CLINIC_OR_DEPARTMENT_OTHER): Payer: Medicaid Other

## 2016-01-29 DIAGNOSIS — C189 Malignant neoplasm of colon, unspecified: Secondary | ICD-10-CM

## 2016-01-29 DIAGNOSIS — C787 Secondary malignant neoplasm of liver and intrahepatic bile duct: Secondary | ICD-10-CM | POA: Diagnosis not present

## 2016-01-29 DIAGNOSIS — C187 Malignant neoplasm of sigmoid colon: Secondary | ICD-10-CM

## 2016-01-29 LAB — COMPREHENSIVE METABOLIC PANEL
ALT: 126 U/L — ABNORMAL HIGH (ref 0–55)
AST: 30 U/L (ref 5–34)
Albumin: 3.4 g/dL — ABNORMAL LOW (ref 3.5–5.0)
Alkaline Phosphatase: 146 U/L (ref 40–150)
Anion Gap: 10 mEq/L (ref 3–11)
BUN: 9.5 mg/dL (ref 7.0–26.0)
CO2: 26 mEq/L (ref 22–29)
Calcium: 9.1 mg/dL (ref 8.4–10.4)
Chloride: 103 mEq/L (ref 98–109)
Creatinine: 1 mg/dL (ref 0.7–1.3)
EGFR: >90 mL/min/{1.73_m2} (ref >90)
Glucose: 101 mg/dl (ref 70–140)
Potassium: 3.8 mEq/L (ref 3.5–5.1)
Sodium: 139 mEq/L (ref 136–145)
Total Bilirubin: 0.46 mg/dL (ref 0.20–1.20)
Total Protein: 7.7 g/dL (ref 6.4–8.3)

## 2016-01-29 LAB — CBC WITH DIFFERENTIAL/PLATELET
BASO%: 0 % (ref 0.0–2.0)
Basophils Absolute: 0 10*3/uL (ref 0.0–0.1)
EOS%: 2 % (ref 0.0–7.0)
Eosinophils Absolute: 0.2 10*3/uL (ref 0.0–0.5)
HCT: 37.6 % — ABNORMAL LOW (ref 38.4–49.9)
HGB: 11.8 g/dL — ABNORMAL LOW (ref 13.0–17.1)
LYMPH%: 26.3 % (ref 14.0–49.0)
MCH: 24.8 pg — ABNORMAL LOW (ref 27.2–33.4)
MCHC: 31.4 g/dL — ABNORMAL LOW (ref 32.0–36.0)
MCV: 79 fL — ABNORMAL LOW (ref 79.3–98.0)
MONO#: 0.7 10*3/uL (ref 0.1–0.9)
MONO%: 7.5 % (ref 0.0–14.0)
NEUT#: 5.7 10*3/uL (ref 1.5–6.5)
NEUT%: 64.2 % (ref 39.0–75.0)
Platelets: 250 10*3/uL (ref 140–400)
RBC: 4.76 10*6/uL (ref 4.20–5.82)
RDW: 14.3 % (ref 11.0–14.6)
WBC: 8.8 10*3/uL (ref 4.0–10.3)
lymph#: 2.3 10*3/uL (ref 0.9–3.3)

## 2016-01-29 LAB — CEA (IN HOUSE-CHCC): CEA (CHCC-In House): 464.98 ng/mL — ABNORMAL HIGH (ref 0.00–5.00)

## 2016-02-01 ENCOUNTER — Other Ambulatory Visit: Payer: Self-pay | Admitting: Oncology

## 2016-02-02 ENCOUNTER — Encounter: Payer: Self-pay | Admitting: *Deleted

## 2016-02-02 ENCOUNTER — Encounter: Payer: Self-pay | Admitting: Oncology

## 2016-02-02 ENCOUNTER — Other Ambulatory Visit (HOSPITAL_BASED_OUTPATIENT_CLINIC_OR_DEPARTMENT_OTHER): Payer: Medicaid Other

## 2016-02-02 ENCOUNTER — Ambulatory Visit (HOSPITAL_BASED_OUTPATIENT_CLINIC_OR_DEPARTMENT_OTHER): Payer: Medicaid Other

## 2016-02-02 VITALS — BP 110/78 | HR 80 | Temp 98.1°F | Resp 18

## 2016-02-02 DIAGNOSIS — C187 Malignant neoplasm of sigmoid colon: Secondary | ICD-10-CM

## 2016-02-02 DIAGNOSIS — C787 Secondary malignant neoplasm of liver and intrahepatic bile duct: Secondary | ICD-10-CM

## 2016-02-02 DIAGNOSIS — Z5111 Encounter for antineoplastic chemotherapy: Secondary | ICD-10-CM

## 2016-02-02 DIAGNOSIS — C189 Malignant neoplasm of colon, unspecified: Secondary | ICD-10-CM

## 2016-02-02 LAB — CBC WITH DIFFERENTIAL/PLATELET
BASO%: 0.6 % (ref 0.0–2.0)
Basophils Absolute: 0.1 10*3/uL (ref 0.0–0.1)
EOS%: 2.1 % (ref 0.0–7.0)
Eosinophils Absolute: 0.2 10*3/uL (ref 0.0–0.5)
HCT: 36 % — ABNORMAL LOW (ref 38.4–49.9)
HGB: 11.5 g/dL — ABNORMAL LOW (ref 13.0–17.1)
LYMPH%: 26 % (ref 14.0–49.0)
MCH: 24.6 pg — ABNORMAL LOW (ref 27.2–33.4)
MCHC: 31.8 g/dL — ABNORMAL LOW (ref 32.0–36.0)
MCV: 77.2 fL — ABNORMAL LOW (ref 79.3–98.0)
MONO#: 0.8 10*3/uL (ref 0.1–0.9)
MONO%: 8.8 % (ref 0.0–14.0)
NEUT#: 6 10*3/uL (ref 1.5–6.5)
NEUT%: 62.5 % (ref 39.0–75.0)
Platelets: 266 10*3/uL (ref 140–400)
RBC: 4.66 10*6/uL (ref 4.20–5.82)
RDW: 15 % — ABNORMAL HIGH (ref 11.0–14.6)
WBC: 9.6 10*3/uL (ref 4.0–10.3)
lymph#: 2.5 10*3/uL (ref 0.9–3.3)

## 2016-02-02 LAB — COMPREHENSIVE METABOLIC PANEL
ALT: 59 U/L — ABNORMAL HIGH (ref 0–55)
AST: 28 U/L (ref 5–34)
Albumin: 3.3 g/dL — ABNORMAL LOW (ref 3.5–5.0)
Alkaline Phosphatase: 130 U/L (ref 40–150)
Anion Gap: 10 mEq/L (ref 3–11)
BUN: 7.7 mg/dL (ref 7.0–26.0)
CO2: 25 mEq/L (ref 22–29)
Calcium: 9.2 mg/dL (ref 8.4–10.4)
Chloride: 104 mEq/L (ref 98–109)
Creatinine: 1 mg/dL (ref 0.7–1.3)
EGFR: >90 mL/min/{1.73_m2} (ref >90)
Glucose: 92 mg/dl (ref 70–140)
Potassium: 4 mEq/L (ref 3.5–5.1)
Sodium: 140 mEq/L (ref 136–145)
Total Bilirubin: 0.47 mg/dL (ref 0.20–1.20)
Total Protein: 7.6 g/dL (ref 6.4–8.3)

## 2016-02-02 MED ORDER — FLUOROURACIL CHEMO INJECTION 2.5 GM/50ML
400.0000 mg/m2 | Freq: Once | INTRAVENOUS | Status: AC
  Administered 2016-02-02: 950 mg via INTRAVENOUS
  Filled 2016-02-02: qty 19

## 2016-02-02 MED ORDER — OXALIPLATIN CHEMO INJECTION 100 MG/20ML
85.0000 mg/m2 | Freq: Once | INTRAVENOUS | Status: AC
  Administered 2016-02-02: 200 mg via INTRAVENOUS
  Filled 2016-02-02: qty 40

## 2016-02-02 MED ORDER — PALONOSETRON HCL INJECTION 0.25 MG/5ML
0.2500 mg | Freq: Once | INTRAVENOUS | Status: AC
  Administered 2016-02-02: 0.25 mg via INTRAVENOUS

## 2016-02-02 MED ORDER — PALONOSETRON HCL INJECTION 0.25 MG/5ML
INTRAVENOUS | Status: AC
  Filled 2016-02-02: qty 5

## 2016-02-02 MED ORDER — SODIUM CHLORIDE 0.9 % IV SOLN
2400.0000 mg/m2 | INTRAVENOUS | Status: DC
  Administered 2016-02-02: 5650 mg via INTRAVENOUS
  Filled 2016-02-02: qty 113

## 2016-02-02 MED ORDER — SODIUM CHLORIDE 0.9 % IV SOLN
10.0000 mg | Freq: Once | INTRAVENOUS | Status: AC
  Administered 2016-02-02: 10 mg via INTRAVENOUS
  Filled 2016-02-02: qty 1

## 2016-02-02 MED ORDER — DEXTROSE 5 % IV SOLN
Freq: Once | INTRAVENOUS | Status: AC
  Administered 2016-02-02: 12:00:00 via INTRAVENOUS

## 2016-02-02 MED ORDER — HEPARIN SOD (PORK) LOCK FLUSH 100 UNIT/ML IV SOLN
500.0000 [IU] | Freq: Once | INTRAVENOUS | Status: DC | PRN
  Filled 2016-02-02: qty 5

## 2016-02-02 MED ORDER — SODIUM CHLORIDE 0.9% FLUSH
10.0000 mL | INTRAVENOUS | Status: DC | PRN
  Filled 2016-02-02: qty 10

## 2016-02-02 MED ORDER — LEUCOVORIN CALCIUM INJECTION 350 MG
400.0000 mg/m2 | Freq: Once | INTRAVENOUS | Status: AC
  Administered 2016-02-02: 940 mg via INTRAVENOUS
  Filled 2016-02-02: qty 47

## 2016-02-02 NOTE — Progress Notes (Signed)
Oncology Nurse Navigator Documentation  Oncology Nurse Navigator Flowsheets 02/02/2016  Navigator Location [facility]  Navigator Encounter Type Treatment  Telephone -  Abnormal Finding Date -  Confirmed Diagnosis Date -  Surgery Date -  Treatment Initiated Date 02/02/2016  Patient Visit Type MedOnc  Treatment Phase First Chemo Tx--FOLFOX  Barriers/Navigation Needs No barriers at this time;No Questions;No Needs  Education -  Interventions None required  Referrals -  Coordination of Care -  Support Groups/Services -  Acuity Level 1  Time Spent with Patient 15  Met with patient and his wife in tx area today. They are doing well and he has his antiemetics at home. No questions or concerns at this time.

## 2016-02-02 NOTE — Patient Instructions (Addendum)
[facility] Discharge Instructions for Patients Receiving Chemotherapy  Today you received the following chemotherapy agents: Oxaliplatin, Leucovorin, and Adrucil   To help prevent nausea and vomiting after your treatment, we encourage you to take your nausea medication as directed. Compazine 10Mg  by mouth every 6 hours as needed.    If you develop nausea and vomiting that is not controlled by your nausea medication, call the clinic.   BELOW ARE SYMPTOMS THAT SHOULD BE REPORTED IMMEDIATELY:  *FEVER GREATER THAN 100.5 F  *CHILLS WITH OR WITHOUT FEVER  NAUSEA AND VOMITING THAT IS NOT CONTROLLED WITH YOUR NAUSEA MEDICATION  *UNUSUAL SHORTNESS OF BREATH  *UNUSUAL BRUISING OR BLEEDING  TENDERNESS IN MOUTH AND THROAT WITH OR WITHOUT PRESENCE OF ULCERS  *URINARY PROBLEMS  *BOWEL PROBLEMS  UNUSUAL RASH Items with * indicate a potential emergency and should be followed up as soon as possible.  Feel free to call the clinic you have any questions or concerns. The clinic phone number is [phone].  Please show the [place] at check-in to the Emergency Department and triage nurse.  Oxaliplatin Injection What is this medicine? OXALIPLATIN (ox AL i PLA tin) is a chemotherapy drug. It targets fast dividing cells, like cancer cells, and causes these cells to die. This medicine is used to treat cancers of the colon and rectum, and many other cancers. This medicine may be used for other purposes; ask your health care provider or pharmacist if you have questions. What should I tell my health care provider before I take this medicine? They need to know if you have any of these conditions: -kidney disease -an unusual or allergic reaction to oxaliplatin, other chemotherapy, other medicines, foods, dyes, or preservatives -pregnant or trying to get pregnant -breast-feeding How should I use this medicine? This drug is given as an infusion into a vein. It is administered  in a hospital or clinic by a specially trained health care professional. Talk to your pediatrician regarding the use of this medicine in children. Special care may be needed. Overdosage: If you think you have taken too much of this medicine contact a poison control center or emergency room at once. NOTE: This medicine is only for you. Do not share this medicine with others. What if I miss a dose? It is important not to miss a dose. Call your doctor or health care professional if you are unable to keep an appointment. What may interact with this medicine? -medicines to increase blood counts like filgrastim, pegfilgrastim, sargramostim -probenecid -some antibiotics like amikacin, gentamicin, neomycin, polymyxin B, streptomycin, tobramycin -zalcitabine Talk to your doctor or health care professional before taking any of these medicines: -acetaminophen -aspirin -ibuprofen -ketoprofen -naproxen This list may not describe all possible interactions. Give your health care provider a list of all the medicines, herbs, non-prescription drugs, or dietary supplements you use. Also tell them if you smoke, drink alcohol, or use illegal drugs. Some items may interact with your medicine. What should I watch for while using this medicine? Your condition will be monitored carefully while you are receiving this medicine. You will need important blood work done while you are taking this medicine. This medicine can make you more sensitive to cold. Do not drink cold drinks or use ice. Cover exposed skin before coming in contact with cold temperatures or cold objects. When out in cold weather wear warm clothing and cover your mouth and nose to warm the air that goes into your lungs. Tell your doctor if  you get sensitive to the cold. This drug may make you feel generally unwell. This is not uncommon, as chemotherapy can affect healthy cells as well as cancer cells. Report any side effects. Continue your course of  treatment even though you feel ill unless your doctor tells you to stop. In some cases, you may be given additional medicines to help with side effects. Follow all directions for their use. Call your doctor or health care professional for advice if you get a fever, chills or sore throat, or other symptoms of a cold or flu. Do not treat yourself. This drug decreases your body's ability to fight infections. Try to avoid being around people who are sick. This medicine may increase your risk to bruise or bleed. Call your doctor or health care professional if you notice any unusual bleeding. Be careful brushing and flossing your teeth or using a toothpick because you may get an infection or bleed more easily. If you have any dental work done, tell your dentist you are receiving this medicine. Avoid taking products that contain aspirin, acetaminophen, ibuprofen, naproxen, or ketoprofen unless instructed by your doctor. These medicines may hide a fever. Do not become pregnant while taking this medicine. Women should inform their doctor if they wish to become pregnant or think they might be pregnant. There is a potential for serious side effects to an unborn child. Talk to your health care professional or pharmacist for more information. Do not breast-feed an infant while taking this medicine. Call your doctor or health care professional if you get diarrhea. Do not treat yourself. What side effects may I notice from receiving this medicine? Side effects that you should report to your doctor or health care professional as soon as possible: -allergic reactions like skin rash, itching or hives, swelling of the face, lips, or tongue -low blood counts - This drug may decrease the number of white blood cells, red blood cells and platelets. You may be at increased risk for infections and bleeding. -signs of infection - fever or chills, cough, sore throat, pain or difficulty passing urine -signs of decreased platelets  or bleeding - bruising, pinpoint red spots on the skin, black, tarry stools, nosebleeds -signs of decreased red blood cells - unusually weak or tired, fainting spells, lightheadedness -breathing problems -chest pain, pressure -cough -diarrhea -jaw tightness -mouth sores -nausea and vomiting -pain, swelling, redness or irritation at the injection site -pain, tingling, numbness in the hands or feet -problems with balance, talking, walking -redness, blistering, peeling or loosening of the skin, including inside the mouth -trouble passing urine or change in the amount of urine Side effects that usually do not require medical attention (report to your doctor or health care professional if they continue or are bothersome): -changes in vision -constipation -hair loss -loss of appetite -metallic taste in the mouth or changes in taste -stomach pain This list may not describe all possible side effects. Call your doctor for medical advice about side effects. You may report side effects to FDA at 1-800-FDA-1088. Where should I keep my medicine? This drug is given in a hospital or clinic and will not be stored at home. NOTE: This sheet is a summary. It may not cover all possible information. If you have questions about this medicine, talk to your doctor, pharmacist, or health care provider.    2016, Elsevier/Gold Standard. (2007-11-27 17:22:47)    Leucovorin injection What is this medicine? LEUCOVORIN (loo koe VOR in) is used to prevent or treat the harmful  effects of some medicines. This medicine is used to treat anemia caused by a low amount of folic acid in the body. It is also used with 5-fluorouracil (5-FU) to treat colon cancer. This medicine may be used for other purposes; ask your health care provider or pharmacist if you have questions. What should I tell my health care provider before I take this medicine? They need to know if you have any of these conditions: -anemia from low  levels of vitamin B-12 in the blood -an unusual or allergic reaction to leucovorin, folic acid, other medicines, foods, dyes, or preservatives -pregnant or trying to get pregnant -breast-feeding How should I use this medicine? This medicine is for injection into a muscle or into a vein. It is given by a health care professional in a hospital or clinic setting. Talk to your pediatrician regarding the use of this medicine in children. Special care may be needed. Overdosage: If you think you have taken too much of this medicine contact a poison control center or emergency room at once. NOTE: This medicine is only for you. Do not share this medicine with others. What if I miss a dose? This does not apply. What may interact with this medicine? -capecitabine -fluorouracil -phenobarbital -phenytoin -primidone -trimethoprim-sulfamethoxazole This list may not describe all possible interactions. Give your health care provider a list of all the medicines, herbs, non-prescription drugs, or dietary supplements you use. Also tell them if you smoke, drink alcohol, or use illegal drugs. Some items may interact with your medicine. What should I watch for while using this medicine? Your condition will be monitored carefully while you are receiving this medicine. This medicine may increase the side effects of 5-fluorouracil, 5-FU. Tell your doctor or health care professional if you have diarrhea or mouth sores that do not get better or that get worse. What side effects may I notice from receiving this medicine? Side effects that you should report to your doctor or health care professional as soon as possible: -allergic reactions like skin rash, itching or hives, swelling of the face, lips, or tongue -breathing problems -fever, infection -mouth sores -unusual bleeding or bruising -unusually weak or tired Side effects that usually do not require medical attention (report to your doctor or health care  professional if they continue or are bothersome): -constipation or diarrhea -loss of appetite -nausea, vomiting This list may not describe all possible side effects. Call your doctor for medical advice about side effects. You may report side effects to FDA at 1-800-FDA-1088. Where should I keep my medicine? This drug is given in a hospital or clinic and will not be stored at home. NOTE: This sheet is a summary. It may not cover all possible information. If you have questions about this medicine, talk to your doctor, pharmacist, or health care provider.    2016, Elsevier/Gold Standard. (2007-11-06 16:50:29)   Fluorouracil, 5-FU injection What is this medicine? FLUOROURACIL, 5-FU (flure oh YOOR a sil) is a chemotherapy drug. It slows the growth of cancer cells. This medicine is used to treat many types of cancer like breast cancer, colon or rectal cancer, pancreatic cancer, and stomach cancer. This medicine may be used for other purposes; ask your health care provider or pharmacist if you have questions. What should I tell my health care provider before I take this medicine? They need to know if you have any of these conditions: -blood disorders -dihydropyrimidine dehydrogenase (DPD) deficiency -infection (especially a virus infection such as chickenpox, cold sores, or herpes) -  kidney disease -liver disease -malnourished, poor nutrition -recent or ongoing radiation therapy -an unusual or allergic reaction to fluorouracil, other chemotherapy, other medicines, foods, dyes, or preservatives -pregnant or trying to get pregnant -breast-feeding How should I use this medicine? This drug is given as an infusion or injection into a vein. It is administered in a hospital or clinic by a specially trained health care professional. Talk to your pediatrician regarding the use of this medicine in children. Special care may be needed. Overdosage: If you think you have taken too much of this medicine  contact a poison control center or emergency room at once. NOTE: This medicine is only for you. Do not share this medicine with others. What if I miss a dose? It is important not to miss your dose. Call your doctor or health care professional if you are unable to keep an appointment. What may interact with this medicine? -allopurinol -cimetidine -dapsone -digoxin -hydroxyurea -leucovorin -levamisole -medicines for seizures like ethotoin, fosphenytoin, phenytoin -medicines to increase blood counts like filgrastim, pegfilgrastim, sargramostim -medicines that treat or prevent blood clots like warfarin, enoxaparin, and dalteparin -methotrexate -metronidazole -pyrimethamine -some other chemotherapy drugs like busulfan, cisplatin, estramustine, vinblastine -trimethoprim -trimetrexate -vaccines Talk to your doctor or health care professional before taking any of these medicines: -acetaminophen -aspirin -ibuprofen -ketoprofen -naproxen This list may not describe all possible interactions. Give your health care provider a list of all the medicines, herbs, non-prescription drugs, or dietary supplements you use. Also tell them if you smoke, drink alcohol, or use illegal drugs. Some items may interact with your medicine. What should I watch for while using this medicine? Visit your doctor for checks on your progress. This drug may make you feel generally unwell. This is not uncommon, as chemotherapy can affect healthy cells as well as cancer cells. Report any side effects. Continue your course of treatment even though you feel ill unless your doctor tells you to stop. In some cases, you may be given additional medicines to help with side effects. Follow all directions for their use. Call your doctor or health care professional for advice if you get a fever, chills or sore throat, or other symptoms of a cold or flu. Do not treat yourself. This drug decreases your body's ability to fight  infections. Try to avoid being around people who are sick. This medicine may increase your risk to bruise or bleed. Call your doctor or health care professional if you notice any unusual bleeding. Be careful brushing and flossing your teeth or using a toothpick because you may get an infection or bleed more easily. If you have any dental work done, tell your dentist you are receiving this medicine. Avoid taking products that contain aspirin, acetaminophen, ibuprofen, naproxen, or ketoprofen unless instructed by your doctor. These medicines may hide a fever. Do not become pregnant while taking this medicine. Women should inform their doctor if they wish to become pregnant or think they might be pregnant. There is a potential for serious side effects to an unborn child. Talk to your health care professional or pharmacist for more information. Do not breast-feed an infant while taking this medicine. Men should inform their doctor if they wish to father a child. This medicine may lower sperm counts. Do not treat diarrhea with over the counter products. Contact your doctor if you have diarrhea that lasts more than 2 days or if it is severe and watery. This medicine can make you more sensitive to the sun. Keep out of the  sun. If you cannot avoid being in the sun, wear protective clothing and use sunscreen. Do not use sun lamps or tanning beds/booths. What side effects may I notice from receiving this medicine? Side effects that you should report to your doctor or health care professional as soon as possible: -allergic reactions like skin rash, itching or hives, swelling of the face, lips, or tongue -low blood counts - this medicine may decrease the number of white blood cells, red blood cells and platelets. You may be at increased risk for infections and bleeding. -signs of infection - fever or chills, cough, sore throat, pain or difficulty passing urine -signs of decreased platelets or bleeding - bruising,  pinpoint red spots on the skin, black, tarry stools, blood in the urine -signs of decreased red blood cells - unusually weak or tired, fainting spells, lightheadedness -breathing problems -changes in vision -chest pain -mouth sores -nausea and vomiting -pain, swelling, redness at site where injected -pain, tingling, numbness in the hands or feet -redness, swelling, or sores on hands or feet -stomach pain -unusual bleeding Side effects that usually do not require medical attention (report to your doctor or health care professional if they continue or are bothersome): -changes in finger or toe nails -diarrhea -dry or itchy skin -hair loss -headache -loss of appetite -sensitivity of eyes to the light -stomach upset -unusually teary eyes This list may not describe all possible side effects. Call your doctor for medical advice about side effects. You may report side effects to FDA at 1-800-FDA-1088. Where should I keep my medicine? This drug is given in a hospital or clinic and will not be stored at home. NOTE: This sheet is a summary. It may not cover all possible information. If you have questions about this medicine, talk to your doctor, pharmacist, or health care provider.    2016, Elsevier/Gold Standard. (2007-09-05 13:53:16)

## 2016-02-02 NOTE — Progress Notes (Signed)
Introduced myself as his FA.  Pt's ins will pay for his treatment at 100% so copay assistance isn't needed.  I offered the [employer], went over what it covers and gave him an expense sheet.  Pt would like to apply.  He's not working right now so he will provide a letter of support from his sister.  Once received I will approve him for the grant.

## 2016-02-03 ENCOUNTER — Other Ambulatory Visit (HOSPITAL_COMMUNITY)
Admission: RE | Admit: 2016-02-03 | Discharge: 2016-02-03 | Disposition: A | Discharge status: Home / Self Care | Payer: Medicaid Other | Source: Ambulatory Visit | Attending: Oncology | Admitting: Oncology

## 2016-02-03 ENCOUNTER — Telehealth: Payer: Self-pay | Admitting: *Deleted

## 2016-02-03 DIAGNOSIS — C189 Malignant neoplasm of colon, unspecified: Secondary | ICD-10-CM | POA: Insufficient documentation

## 2016-02-03 NOTE — Telephone Encounter (Signed)
Spoke with pt, he denies any side effects from chemo. Reviewed cold avoidance, antiemetic instructions. Instructed pt to push fluids for next few days to help prevent constipation. He voiced understanding.

## 2016-02-04 ENCOUNTER — Ambulatory Visit (HOSPITAL_BASED_OUTPATIENT_CLINIC_OR_DEPARTMENT_OTHER): Payer: Medicaid Other

## 2016-02-04 VITALS — BP 144/82 | HR 99 | Temp 98.4°F | Resp 18

## 2016-02-04 DIAGNOSIS — Z95828 Presence of other vascular implants and grafts: Secondary | ICD-10-CM

## 2016-02-04 DIAGNOSIS — Z452 Encounter for adjustment and management of vascular access device: Secondary | ICD-10-CM | POA: Diagnosis present

## 2016-02-04 DIAGNOSIS — C187 Malignant neoplasm of sigmoid colon: Secondary | ICD-10-CM

## 2016-02-04 DIAGNOSIS — C787 Secondary malignant neoplasm of liver and intrahepatic bile duct: Secondary | ICD-10-CM

## 2016-02-04 MED ORDER — SODIUM CHLORIDE 0.9% FLUSH
10.0000 mL | INTRAVENOUS | Status: AC | PRN
  Administered 2016-02-04: 10 mL via INTRAVENOUS
  Filled 2016-02-04: qty 10

## 2016-02-04 MED ORDER — ALTEPLASE 2 MG IJ SOLR
2.0000 mg | Freq: Once | INTRAMUSCULAR | Status: AC | PRN
  Filled 2016-02-04: qty 2

## 2016-02-04 MED ORDER — HEPARIN SOD (PORK) LOCK FLUSH 100 UNIT/ML IV SOLN
500.0000 [IU] | Freq: Once | INTRAVENOUS | Status: AC
  Administered 2016-02-04: 500 [IU] via INTRAVENOUS
  Filled 2016-02-04: qty 5

## 2016-02-04 NOTE — Patient Instructions (Signed)

## 2016-02-09 NOTE — Telephone Encounter (Signed)
This encounter was created in error - please disregard.

## 2016-02-11 ENCOUNTER — Telehealth: Payer: Self-pay | Admitting: *Deleted

## 2016-02-11 NOTE — Telephone Encounter (Signed)
Message from pt's wife reporting he was dizzy/ lightheaded for 2 days. Attempted to contact pt, only number listed was wife's cell #. She stated in voicemail she will be working until [employer].  Left detailed message with oral hydration instructions. Is he having nausea/vomiting or diarrhea? Has he been able to eat and drink?

## 2016-02-12 ENCOUNTER — Other Ambulatory Visit: Payer: Self-pay | Admitting: *Deleted

## 2016-02-12 DIAGNOSIS — C787 Secondary malignant neoplasm of liver and intrahepatic bile duct: Secondary | ICD-10-CM

## 2016-02-12 DIAGNOSIS — C187 Malignant neoplasm of sigmoid colon: Secondary | ICD-10-CM

## 2016-02-12 DIAGNOSIS — C189 Malignant neoplasm of colon, unspecified: Secondary | ICD-10-CM

## 2016-02-12 NOTE — Telephone Encounter (Signed)
"  Returning call to Lavella Lemons about my husband.  He was nauseated the week of chemotherapy.  Used nausea medicine and vomited twice.  No diarrhea or further vomiting.  B/P is good = 128/74 with Dr. [name] this week. We check it in [place] at times.  No further complaints of dizziness since this one time about 11:00 - 12:00 pm.  He was getting up for [place] to go somewhere.  May have happened one to two years ago, the doctor said it could be vertigo.  He didn't eat the week of chemotherapy but he's made up for it now.  I can always get him to drink water and fluids with no problem." Encouraged to continue to drink, dangle to get his bearings before standing.

## 2016-02-13 ENCOUNTER — Other Ambulatory Visit: Payer: Self-pay | Admitting: Endocrinology

## 2016-02-13 NOTE — Telephone Encounter (Signed)
Please refill x 1 Ov is due  

## 2016-02-14 ENCOUNTER — Other Ambulatory Visit: Payer: Self-pay | Admitting: Oncology

## 2016-02-15 ENCOUNTER — Ambulatory Visit (HOSPITAL_BASED_OUTPATIENT_CLINIC_OR_DEPARTMENT_OTHER): Payer: Medicaid Other | Admitting: Nurse Practitioner

## 2016-02-15 ENCOUNTER — Other Ambulatory Visit (HOSPITAL_BASED_OUTPATIENT_CLINIC_OR_DEPARTMENT_OTHER): Payer: Medicaid Other

## 2016-02-15 ENCOUNTER — Ambulatory Visit (HOSPITAL_BASED_OUTPATIENT_CLINIC_OR_DEPARTMENT_OTHER): Payer: Medicaid Other

## 2016-02-15 ENCOUNTER — Telehealth: Payer: Self-pay | Admitting: *Deleted

## 2016-02-15 ENCOUNTER — Other Ambulatory Visit: Payer: Self-pay | Admitting: Endocrinology

## 2016-02-15 ENCOUNTER — Ambulatory Visit: Payer: Medicaid Other

## 2016-02-15 ENCOUNTER — Telehealth: Payer: Self-pay | Admitting: Nurse Practitioner

## 2016-02-15 VITALS — BP 124/90 | HR 92 | Temp 98.0°F | Resp 18

## 2016-02-15 DIAGNOSIS — C189 Malignant neoplasm of colon, unspecified: Secondary | ICD-10-CM

## 2016-02-15 DIAGNOSIS — C187 Malignant neoplasm of sigmoid colon: Secondary | ICD-10-CM

## 2016-02-15 DIAGNOSIS — R911 Solitary pulmonary nodule: Secondary | ICD-10-CM | POA: Diagnosis not present

## 2016-02-15 DIAGNOSIS — C787 Secondary malignant neoplasm of liver and intrahepatic bile duct: Secondary | ICD-10-CM

## 2016-02-15 DIAGNOSIS — F418 Other specified anxiety disorders: Secondary | ICD-10-CM | POA: Diagnosis not present

## 2016-02-15 DIAGNOSIS — Z5111 Encounter for antineoplastic chemotherapy: Secondary | ICD-10-CM

## 2016-02-15 DIAGNOSIS — I10 Essential (primary) hypertension: Secondary | ICD-10-CM | POA: Diagnosis not present

## 2016-02-15 LAB — COMPREHENSIVE METABOLIC PANEL
ALT: 71 U/L — ABNORMAL HIGH (ref 0–55)
AST: 44 U/L — ABNORMAL HIGH (ref 5–34)
Albumin: 3.4 g/dL — ABNORMAL LOW (ref 3.5–5.0)
Alkaline Phosphatase: 111 U/L (ref 40–150)
Anion Gap: 11 mEq/L (ref 3–11)
BUN: 7.6 mg/dL (ref 7.0–26.0)
CO2: 24 mEq/L (ref 22–29)
Calcium: 9.2 mg/dL (ref 8.4–10.4)
Chloride: 107 mEq/L (ref 98–109)
Creatinine: 1.1 mg/dL (ref 0.7–1.3)
EGFR: >90 mL/min/{1.73_m2} (ref >90)
Glucose: 144 mg/dl — ABNORMAL HIGH (ref 70–140)
Potassium: 3.7 mEq/L (ref 3.5–5.1)
Sodium: 143 mEq/L (ref 136–145)
Total Bilirubin: 0.33 mg/dL (ref 0.20–1.20)
Total Protein: 7.5 g/dL (ref 6.4–8.3)

## 2016-02-15 LAB — CBC WITH DIFFERENTIAL/PLATELET
BASO%: 0 % (ref 0.0–2.0)
Basophils Absolute: 0 10*3/uL (ref 0.0–0.1)
EOS%: 3.6 % (ref 0.0–7.0)
Eosinophils Absolute: 0.2 10*3/uL (ref 0.0–0.5)
HCT: 37.5 % — ABNORMAL LOW (ref 38.4–49.9)
HGB: 11.7 g/dL — ABNORMAL LOW (ref 13.0–17.1)
LYMPH%: 43.5 % (ref 14.0–49.0)
MCH: 24.6 pg — ABNORMAL LOW (ref 27.2–33.4)
MCHC: 31.2 g/dL — ABNORMAL LOW (ref 32.0–36.0)
MCV: 78.9 fL — ABNORMAL LOW (ref 79.3–98.0)
MONO#: 0.5 10*3/uL (ref 0.1–0.9)
MONO%: 8.7 % (ref 0.0–14.0)
NEUT#: 2.3 10*3/uL (ref 1.5–6.5)
NEUT%: 44.2 % (ref 39.0–75.0)
Platelets: 156 10*3/uL (ref 140–400)
RBC: 4.75 10*6/uL (ref 4.20–5.82)
RDW: 14.3 % (ref 11.0–14.6)
WBC: 5.3 10*3/uL (ref 4.0–10.3)
lymph#: 2.3 10*3/uL (ref 0.9–3.3)

## 2016-02-15 MED ORDER — PALONOSETRON HCL INJECTION 0.25 MG/5ML
INTRAVENOUS | Status: AC
Start: 2016-02-15 — End: 2016-02-15
  Filled 2016-02-15: qty 5

## 2016-02-15 MED ORDER — SODIUM CHLORIDE 0.9 % IV SOLN
2400.0000 mg/m2 | INTRAVENOUS | Status: DC
  Administered 2016-02-15: 5650 mg via INTRAVENOUS
  Filled 2016-02-15: qty 113

## 2016-02-15 MED ORDER — LORAZEPAM 0.5 MG PO TABS: 0.5000 mg | ORAL_TABLET | Freq: Three times a day (TID) | ORAL | 0 refills | Status: DC | PRN

## 2016-02-15 MED ORDER — LEUCOVORIN CALCIUM INJECTION 350 MG
400.0000 mg/m2 | Freq: Once | INTRAVENOUS | Status: AC
  Administered 2016-02-15: 940 mg via INTRAVENOUS
  Filled 2016-02-15: qty 47

## 2016-02-15 MED ORDER — FLUOROURACIL CHEMO INJECTION 2.5 GM/50ML
400.0000 mg/m2 | Freq: Once | INTRAVENOUS | Status: AC
  Administered 2016-02-15: 950 mg via INTRAVENOUS
  Filled 2016-02-15: qty 19

## 2016-02-15 MED ORDER — FOSAPREPITANT DIMEGLUMINE INJECTION 150 MG
Freq: Once | INTRAVENOUS | Status: AC
  Administered 2016-02-15: 13:00:00 via INTRAVENOUS
  Filled 2016-02-15: qty 5

## 2016-02-15 MED ORDER — DEXTROSE 5 % IV SOLN
Freq: Once | INTRAVENOUS | Status: AC
  Administered 2016-02-15: 12:00:00 via INTRAVENOUS

## 2016-02-15 MED ORDER — PALONOSETRON HCL INJECTION 0.25 MG/5ML
0.2500 mg | Freq: Once | INTRAVENOUS | Status: AC
  Administered 2016-02-15: 0.25 mg via INTRAVENOUS

## 2016-02-15 MED ORDER — OXALIPLATIN CHEMO INJECTION 100 MG/20ML
85.0000 mg/m2 | Freq: Once | INTRAVENOUS | Status: AC
  Administered 2016-02-15: 200 mg via INTRAVENOUS
  Filled 2016-02-15: qty 40

## 2016-02-15 NOTE — Telephone Encounter (Signed)
Message sent to chemo scheduler to add. Avs report and appointment schedule given to patient per 02/15/16 los.

## 2016-02-15 NOTE — Progress Notes (Signed)
  [facility] OFFICE PROGRESS NOTE   Diagnosis:  Colon cancer  INTERVAL HISTORY:   Jeff [name] returns as scheduled. He completed cycle 1 FOLFOX 02/02/2016. He had several episodes of nausea/vomiting. Compazine was partially effective. No mouth sores though he has recently noted the roof of the mouth is "sore". He is eating and drinking without difficulty. No diarrhea. He resumed cold around day 5.  Objective:  Vital signs in last 24 hours:  There were no vitals taken for this visit.    HEENT: No thrush or ulcers. Resp: Lungs clear bilaterally. Cardio: Regular rate and rhythm. GI: Abdomen soft and nontender. No hepatomegaly. Vascular: No leg edema. Port-A-Cath without erythema.  Lab Results:  Lab Results  Component Value Date   WBC 5.3 02/15/2016   HGB 11.7 (L) 02/15/2016   HCT 37.5 (L) 02/15/2016   MCV 78.9 (L) 02/15/2016   PLT 156 02/15/2016   NEUTROABS 2.3 02/15/2016    Imaging:  No results found.  Medications: I have reviewed the patient's current medications.  Assessment/Plan: 1. 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 CTschest, abdomen, and pelvis on 12/11/2015-indeterminate tiny pulmonary nodules, multiple liver metastases, small nodes in the sigmoid mesocolon ? Laparoscopic sigmoid colectomy, liver biopsy, Port-A-Cath placement 01/14/2016 ? Pathology sigmoid colon resection- colonic adenocarcinoma, 5.3 cm extending into pericolonic connective tissue, positive lymph-vascular invasion, positive perineural invasion, negative margins, metastatic carcinoma in 9 of 28 lymph nodes ? Pathology liver biopsy-metastatic colorectal adenocarcinoma ? MSI stable; mismatch repair protein normal ? Cycle 1 FOLFOX 02/02/2016 ? Cycle 2 FOLFOX 02/15/2016  2. Rectal bleeding and constipation secondary to #1  3. History of peripheral vascular disease, status post left lower  extremity vascular bypass surgery in April 2017  4. History of nephrolithiasis  5. History of Graves' disease treated with radioactive iodine  6. Anxiety/depression  7. Hypertension  8.   Hospitalization 01/19/2016 with wound dehiscence status post secondary suture closure of abdominal wall   Disposition: Jeff [name] appears stable. He has completed 1 cycle of FOLFOX. Plan to proceed with cycle 2 today as scheduled.  He had delayed nausea. Emend will be added to the premedication regimen. He was also given a prescription for Ativan 0.5 mg every 8 hours as needed. He understands to contact the office if he has nausea despite these measures.  He will return for a follow-up visit and cycle 3 in 2 weeks. He will contact the office in the interim with any problems.  Plan reviewed with Dr. Benay Spice.    Ned Card ANP/GNP-BC   02/15/2016  10:45 AM

## 2016-02-15 NOTE — Telephone Encounter (Signed)
Per LOS I have scheduled appt and notified the scheduler 

## 2016-02-15 NOTE — Patient Instructions (Signed)
[facility] Discharge Instructions for Patients Receiving Chemotherapy  Today you received the following chemotherapy agents 5 Fu/Leucovorin/Oxaliplatin To help prevent nausea and vomiting after your treatment, we encourage you to take your nausea medication as prescribed.  If you develop nausea and vomiting that is not controlled by your nausea medication, call the clinic.   BELOW ARE SYMPTOMS THAT SHOULD BE REPORTED IMMEDIATELY:  *FEVER GREATER THAN 100.5 F  *CHILLS WITH OR WITHOUT FEVER  NAUSEA AND VOMITING THAT IS NOT CONTROLLED WITH YOUR NAUSEA MEDICATION  *UNUSUAL SHORTNESS OF BREATH  *UNUSUAL BRUISING OR BLEEDING  TENDERNESS IN MOUTH AND THROAT WITH OR WITHOUT PRESENCE OF ULCERS  *URINARY PROBLEMS  *BOWEL PROBLEMS  UNUSUAL RASH Items with * indicate a potential emergency and should be followed up as soon as possible.  Feel free to call the clinic you have any questions or concerns. The clinic phone number is [phone].  Please show the CHEMO ALERT CARD at check-in to the Emergency Department and triage nurse.   

## 2016-02-16 ENCOUNTER — Telehealth: Payer: Self-pay | Admitting: *Deleted

## 2016-02-16 NOTE — Telephone Encounter (Signed)
Called patient's wife to speak with her in regards to call she made to [facility] last night regarding  "moisture under the bandage where his chemo port is".  Patient's wife states that port dressing is dry this morning and dressing is intact with no openings to port site.  Patient's wife instructed to call back if any further moisture is noted.  Patient's wife verbalizes an understanding of information and is appreciative of call.

## 2016-02-17 ENCOUNTER — Ambulatory Visit (HOSPITAL_BASED_OUTPATIENT_CLINIC_OR_DEPARTMENT_OTHER): Payer: Medicaid Other

## 2016-02-17 VITALS — BP 95/72 | HR 90 | Temp 98.3°F | Resp 18

## 2016-02-17 DIAGNOSIS — C187 Malignant neoplasm of sigmoid colon: Secondary | ICD-10-CM | POA: Diagnosis not present

## 2016-02-17 MED ORDER — HEPARIN SOD (PORK) LOCK FLUSH 100 UNIT/ML IV SOLN
500.0000 [IU] | Freq: Once | INTRAVENOUS | Status: AC | PRN
  Administered 2016-02-17: 500 [IU]
  Filled 2016-02-17: qty 5

## 2016-02-17 MED ORDER — SODIUM CHLORIDE 0.9% FLUSH
10.0000 mL | INTRAVENOUS | Status: DC | PRN
  Administered 2016-02-17: 10 mL
  Filled 2016-02-17: qty 10

## 2016-02-23 ENCOUNTER — Encounter (HOSPITAL_COMMUNITY): Payer: Self-pay

## 2016-02-23 ENCOUNTER — Encounter: Payer: Self-pay | Admitting: Oncology

## 2016-02-23 NOTE — Progress Notes (Signed)
Pt is approved for the $400 [facility] grant.  °

## 2016-02-26 ENCOUNTER — Other Ambulatory Visit: Payer: Self-pay

## 2016-02-29 ENCOUNTER — Telehealth: Payer: Self-pay | Admitting: Oncology

## 2016-02-29 ENCOUNTER — Ambulatory Visit (HOSPITAL_BASED_OUTPATIENT_CLINIC_OR_DEPARTMENT_OTHER): Payer: Medicaid Other

## 2016-02-29 ENCOUNTER — Ambulatory Visit: Payer: Medicaid Other

## 2016-02-29 ENCOUNTER — Telehealth: Payer: Self-pay | Admitting: *Deleted

## 2016-02-29 ENCOUNTER — Other Ambulatory Visit (HOSPITAL_BASED_OUTPATIENT_CLINIC_OR_DEPARTMENT_OTHER): Payer: Medicaid Other

## 2016-02-29 ENCOUNTER — Ambulatory Visit (HOSPITAL_BASED_OUTPATIENT_CLINIC_OR_DEPARTMENT_OTHER): Payer: Medicaid Other | Admitting: Oncology

## 2016-02-29 VITALS — BP 121/71 | HR 85 | Temp 98.3°F | Resp 20 | Ht 70.0 in | Wt 248.7 lb

## 2016-02-29 DIAGNOSIS — C787 Secondary malignant neoplasm of liver and intrahepatic bile duct: Secondary | ICD-10-CM

## 2016-02-29 DIAGNOSIS — F418 Other specified anxiety disorders: Secondary | ICD-10-CM

## 2016-02-29 DIAGNOSIS — C187 Malignant neoplasm of sigmoid colon: Secondary | ICD-10-CM | POA: Diagnosis not present

## 2016-02-29 DIAGNOSIS — R911 Solitary pulmonary nodule: Secondary | ICD-10-CM

## 2016-02-29 DIAGNOSIS — I10 Essential (primary) hypertension: Secondary | ICD-10-CM

## 2016-02-29 DIAGNOSIS — Z5111 Encounter for antineoplastic chemotherapy: Secondary | ICD-10-CM

## 2016-02-29 DIAGNOSIS — C189 Malignant neoplasm of colon, unspecified: Secondary | ICD-10-CM

## 2016-02-29 DIAGNOSIS — Z95828 Presence of other vascular implants and grafts: Secondary | ICD-10-CM

## 2016-02-29 LAB — COMPREHENSIVE METABOLIC PANEL
ALT: 47 U/L (ref 0–55)
AST: 33 U/L (ref 5–34)
Albumin: 3.2 g/dL — ABNORMAL LOW (ref 3.5–5.0)
Alkaline Phosphatase: 101 U/L (ref 40–150)
Anion Gap: 11 mEq/L (ref 3–11)
BUN: 8.5 mg/dL (ref 7.0–26.0)
CO2: 22 mEq/L (ref 22–29)
Calcium: 8.7 mg/dL (ref 8.4–10.4)
Chloride: 106 mEq/L (ref 98–109)
Creatinine: 1.1 mg/dL (ref 0.7–1.3)
EGFR: >90 mL/min/{1.73_m2} (ref >90)
Glucose: 141 mg/dl — ABNORMAL HIGH (ref 70–140)
Potassium: 3.6 mEq/L (ref 3.5–5.1)
Sodium: 139 mEq/L (ref 136–145)
Total Bilirubin: 0.26 mg/dL (ref 0.20–1.20)
Total Protein: 7 g/dL (ref 6.4–8.3)

## 2016-02-29 LAB — CBC WITH DIFFERENTIAL/PLATELET
BASO%: 0.2 % (ref 0.0–2.0)
Basophils Absolute: 0 10*3/uL (ref 0.0–0.1)
EOS%: 4.2 % (ref 0.0–7.0)
Eosinophils Absolute: 0.2 10*3/uL (ref 0.0–0.5)
HCT: 35.7 % — ABNORMAL LOW (ref 38.4–49.9)
HGB: 11.4 g/dL — ABNORMAL LOW (ref 13.0–17.1)
LYMPH%: 44.5 % (ref 14.0–49.0)
MCH: 25 pg — ABNORMAL LOW (ref 27.2–33.4)
MCHC: 31.9 g/dL — ABNORMAL LOW (ref 32.0–36.0)
MCV: 78.3 fL — ABNORMAL LOW (ref 79.3–98.0)
MONO#: 0.3 10*3/uL (ref 0.1–0.9)
MONO%: 7.5 % (ref 0.0–14.0)
NEUT#: 1.9 10*3/uL (ref 1.5–6.5)
NEUT%: 43.6 % (ref 39.0–75.0)
Platelets: 105 10*3/uL — ABNORMAL LOW (ref 140–400)
RBC: 4.56 10*6/uL (ref 4.20–5.82)
RDW: 15.1 % — ABNORMAL HIGH (ref 11.0–14.6)
WBC: 4.3 10*3/uL (ref 4.0–10.3)
lymph#: 1.9 10*3/uL (ref 0.9–3.3)
nRBC: 1 % — ABNORMAL HIGH (ref 0–0)

## 2016-02-29 LAB — CEA (IN HOUSE-CHCC): CEA (CHCC-In House): 119.96 ng/mL — ABNORMAL HIGH (ref 0.00–5.00)

## 2016-02-29 MED ORDER — DEXTROSE 5 % IV SOLN
Freq: Once | INTRAVENOUS | Status: AC
  Administered 2016-02-29: 11:00:00 via INTRAVENOUS

## 2016-02-29 MED ORDER — LEUCOVORIN CALCIUM INJECTION 350 MG
400.0000 mg/m2 | Freq: Once | INTRAMUSCULAR | Status: AC
  Administered 2016-02-29: 940 mg via INTRAVENOUS
  Filled 2016-02-29: qty 47

## 2016-02-29 MED ORDER — PALONOSETRON HCL INJECTION 0.25 MG/5ML
0.2500 mg | Freq: Once | INTRAVENOUS | Status: AC
  Administered 2016-02-29: 0.25 mg via INTRAVENOUS

## 2016-02-29 MED ORDER — PALONOSETRON HCL INJECTION 0.25 MG/5ML
INTRAVENOUS | Status: AC
  Filled 2016-02-29: qty 5

## 2016-02-29 MED ORDER — SODIUM CHLORIDE 0.9% FLUSH
10.0000 mL | INTRAVENOUS | Status: DC | PRN
  Administered 2016-02-29: 10 mL via INTRAVENOUS
  Filled 2016-02-29: qty 10

## 2016-02-29 MED ORDER — FLUOROURACIL CHEMO INJECTION 2.5 GM/50ML
400.0000 mg/m2 | Freq: Once | INTRAVENOUS | Status: AC
  Administered 2016-02-29: 950 mg via INTRAVENOUS
  Filled 2016-02-29: qty 19

## 2016-02-29 MED ORDER — SODIUM CHLORIDE 0.9 % IV SOLN
2400.0000 mg/m2 | INTRAVENOUS | Status: DC
  Administered 2016-02-29: 5650 mg via INTRAVENOUS
  Filled 2016-02-29: qty 113

## 2016-02-29 MED ORDER — SODIUM CHLORIDE 0.9 % IV SOLN
Freq: Once | INTRAVENOUS | Status: AC
  Administered 2016-02-29: 12:00:00 via INTRAVENOUS
  Filled 2016-02-29: qty 5

## 2016-02-29 MED ORDER — OXALIPLATIN CHEMO INJECTION 100 MG/20ML
85.0000 mg/m2 | Freq: Once | INTRAVENOUS | Status: AC
  Administered 2016-02-29: 200 mg via INTRAVENOUS
  Filled 2016-02-29: qty 40

## 2016-02-29 NOTE — Telephone Encounter (Signed)
Per LOS I have scheduled appts and notified the scheduler 

## 2016-02-29 NOTE — Progress Notes (Signed)
  [place] OFFICE PROGRESS NOTE   Diagnosis: Colon cancer  INTERVAL HISTORY:   [name] returns as scheduled. He completed another cycle of FOLFOX 02/15/2016. He reports malaise following chemotherapy. Nausea was improved following this cycle of chemotherapy. No neuropathy symptoms. He reports constipation. He complains of "itching "at the anus.  Objective:  Vital signs in last 24 hours:  Blood pressure 121/71, pulse 85, temperature 98.3 F (36.8 C), temperature source Oral, resp. rate 20, height 5' 10" (1.778 m), weight 248 lb 11.2 oz (112.8 kg), SpO2 100 %.    HEENT: No thrush or ulcers Resp: Lungs clear bilaterally Cardio: Regular rate and rhythm GI: No hepatomegaly, nontender, no skin breakdown at the perineum-soft external hemorrhoids Vascular: No leg edema  Portacath/PICC-without erythema  Lab Results:  Lab Results  Component Value Date   WBC 4.3 02/29/2016   HGB 11.4 (L) 02/29/2016   HCT 35.7 (L) 02/29/2016   MCV 78.3 (L) 02/29/2016   PLT 105 (L) 02/29/2016   NEUTROABS 1.9 02/29/2016     Medications: I have reviewed the patient's current medications.  1. 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 CTschest, abdomen, and pelvis on 12/11/2015-indeterminate tiny pulmonary nodules, multiple liver metastases, small nodes in the sigmoid mesocolon ? Laparoscopic sigmoid colectomy, liver biopsy, Port-A-Cath placement 01/14/2016 ? Pathology sigmoid colon resection-colonic adenocarcinoma, 5.3 cm extending into pericolonic connective tissue, positive lymph-vascular invasion, positive perineural invasion, negative margins, metastatic carcinoma in9 of 28 lymph nodes ? Pathology liver biopsy-metastatic colorectal adenocarcinoma ? MSI stable; mismatch repair protein normal ? APC alteration, K RAS wild-type, no BRAF mutation ? Cycle 1 FOLFOX 02/02/2016 ? Cycle 2 FOLFOX  02/15/2016 ? Cycle 3 FOLFOX 02/29/2016  2. Rectal bleeding and constipation secondary to #1  3. History of peripheral vascular disease, status post left lower extremity vascular bypass surgery in April 2017  4. History of nephrolithiasis  5. History of Graves' disease treated with radioactive iodine  6. Anxiety/depression  7. Hypertension  8. Hospitalization 01/19/2016 with wound dehiscence status post secondary suture closure of abdominal wall     Disposition:  [name] is tolerating the FOLFOX well. The plan is to proceed with cycle 3 today. He will try Anusol for the hemorrhoid itching. He will return for an office visit and cycle 4 chemotherapy in 2 weeks.  The plan is to complete 5 cycles of FOLFOX prior to a restaging CT. We will follow-up on the CEA from today. The plan is to add panitumumab if the CEA is not lower. I reviewed potential toxicities associated with panitumumab including the chance for an allergic reaction, diarrhea, and rash. He agrees to proceed. On  Betsy Coder, MD  02/29/2016  10:28 AM

## 2016-02-29 NOTE — Progress Notes (Signed)
Patient notified in infusion room per Dr. Benay Spice that cea has improved.

## 2016-02-29 NOTE — Progress Notes (Signed)
Pt notified of cea results in infusion room per Dr. Benay Spice.  Patient appreciative of information and has no questions at this time.

## 2016-02-29 NOTE — Telephone Encounter (Signed)
Message sent to infusion scheduler to add Infusion per 02/29/16 los. Avs report and appointment schedule given to patient,per 02/29/16 los.

## 2016-02-29 NOTE — Patient Instructions (Signed)
[facility] Discharge Instructions for Patients Receiving Chemotherapy  Today you received the following chemotherapy agents 5 Fu/Leucovorin/Oxaliplatin To help prevent nausea and vomiting after your treatment, we encourage you to take your nausea medication as prescribed.  If you develop nausea and vomiting that is not controlled by your nausea medication, call the clinic.   BELOW ARE SYMPTOMS THAT SHOULD BE REPORTED IMMEDIATELY:  *FEVER GREATER THAN 100.5 F  *CHILLS WITH OR WITHOUT FEVER  NAUSEA AND VOMITING THAT IS NOT CONTROLLED WITH YOUR NAUSEA MEDICATION  *UNUSUAL SHORTNESS OF BREATH  *UNUSUAL BRUISING OR BLEEDING  TENDERNESS IN MOUTH AND THROAT WITH OR WITHOUT PRESENCE OF ULCERS  *URINARY PROBLEMS  *BOWEL PROBLEMS  UNUSUAL RASH Items with * indicate a potential emergency and should be followed up as soon as possible.  Feel free to call the clinic you have any questions or concerns. The clinic phone number is [phone].  Please show the CHEMO ALERT CARD at check-in to the Emergency Department and triage nurse.   

## 2016-03-02 ENCOUNTER — Encounter: Payer: Medicaid Other | Admitting: Genetic Counselor

## 2016-03-02 ENCOUNTER — Ambulatory Visit (HOSPITAL_BASED_OUTPATIENT_CLINIC_OR_DEPARTMENT_OTHER): Payer: Medicaid Other

## 2016-03-02 ENCOUNTER — Other Ambulatory Visit: Payer: Medicaid Other

## 2016-03-02 VITALS — BP 111/73 | HR 80 | Temp 98.2°F | Resp 20

## 2016-03-02 DIAGNOSIS — C187 Malignant neoplasm of sigmoid colon: Secondary | ICD-10-CM

## 2016-03-02 DIAGNOSIS — Z452 Encounter for adjustment and management of vascular access device: Secondary | ICD-10-CM

## 2016-03-02 MED ORDER — HEPARIN SOD (PORK) LOCK FLUSH 100 UNIT/ML IV SOLN
500.0000 [IU] | Freq: Once | INTRAVENOUS | Status: AC | PRN
  Administered 2016-03-02: 500 [IU]
  Filled 2016-03-02: qty 5

## 2016-03-02 MED ORDER — SODIUM CHLORIDE 0.9% FLUSH
10.0000 mL | INTRAVENOUS | Status: DC | PRN
  Administered 2016-03-02: 10 mL
  Filled 2016-03-02: qty 10

## 2016-03-04 ENCOUNTER — Ambulatory Visit: Payer: Medicaid Other | Admitting: Endocrinology

## 2016-03-08 ENCOUNTER — Encounter: Payer: Self-pay | Admitting: Endocrinology

## 2016-03-08 ENCOUNTER — Ambulatory Visit (INDEPENDENT_AMBULATORY_CARE_PROVIDER_SITE_OTHER): Payer: Medicaid Other | Admitting: Endocrinology

## 2016-03-08 VITALS — BP 126/84 | HR 112 | Ht 70.0 in | Wt 245.0 lb

## 2016-03-08 DIAGNOSIS — E890 Postprocedural hypothyroidism: Secondary | ICD-10-CM

## 2016-03-08 LAB — TSH: TSH: 4.36 u[IU]/mL (ref 0.35–4.50)

## 2016-03-08 NOTE — Progress Notes (Signed)
Subjective:    Patient ID: Jeff [name], male    DOB: 1965/11/02, 50 y.o.   MRN: [id number]  HPI Pt returns for f/u of post-RAI hypothyroidism (in 2012, he had RAI rx for hyperthyroidism, due to Grave's Dz vs multinodular goiter vs both).  pt states he feels well in general (no chemo this week), and he takes synthroid as rx'ed.   Past Medical History:  Diagnosis Date  . Allergic rhinitis   . Anxiety   . Arthritis   . Asthma   . At risk for sleep apnea    STOP-BANG= 6       SENT TO PCP 01-22-2015  . Chronic back pain    "from the neck to the lower back"   . 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 Depression   . Dysthymic disorder   . ED (erectile dysfunction)   . GERD (gastroesophageal reflux disease)   . History of acute pyelonephritis    01-07-2015  . History of Graves' disease    vs  Multinodular  . History of hiatal hernia   . History of kidney stones   . History of non-ST elevation myocardial infarction (NSTEMI)    Jan 2014--  no CAD;  per notes probable coronary vasospasm  . History of panic attacks   . History of septic shock    01-07-2015--  ureterolithias/ pyelonephritis  . History of thyroid storm    Nov 2011  . Hyperlipidemia   . Hypertension   . Hypothyroidism following radioiodine therapy    RAI in Mar 2012---  followed by dr Loanne Drilling  . PAD (peripheral artery disease) [facility]) cardiologist-  dr Fletcher Anon   a.  ABI (01/2014):  L 0.53; R 1.0 >> referred to PV  . Right ureteral stone   . Septic shock ([place]) 01/05/2015  . Sleep disturbance   . Thrombocytopenia ([place])     Past Surgical History:  Procedure Laterality Date  . ABDOMINAL AORTAGRAM N/A 02/19/2014   Procedure: ABDOMINAL Maxcine Ham;  Surgeon: Wellington Hampshire, MD;  Location: [facility] CATH LAB;  Service: Cardiovascular;  Laterality: N/A;  . ANTERIOR CERVICAL DECOMP/DISCECTOMY FUSION  09-08-2010   C3 -4    . BACK SURGERY     x5, LOWER X 3, UPPER NECK X 2  . CYSTOSCOPY W/ URETERAL STENT PLACEMENT Right 01/04/2015   Procedure: CYSTOSCOPY WITH RETROGRADE PYELOGRAM/URETERAL STENT PLACEMENT;  Surgeon: Franchot Gallo, MD;  Location: [facility];  Service: Urology;  Laterality: Right;  . CYSTOSCOPY W/ URETERAL STENT REMOVAL Right 01/26/2015   Procedure: CYSTOSCOPY WITH STENT REMOVAL;  Surgeon: Franchot Gallo, MD;  Location: [facility];  Service: Urology;  Laterality: Right;  . CYSTOSCOPY/URETEROSCOPY/HOLMIUM LASER/STENT PLACEMENT Right 01/26/2015   Procedure: CYSTOSCOPY/URETEROSCOPY RIGHT;  Surgeon: Franchot Gallo, MD;  Location: [facility];  Service: Urology;  Laterality: Right;  . FEMORAL-POPLITEAL BYPASS GRAFT Left 09/11/2015   Procedure: BYPASS GRAFT LEFT ABOVE KNEE TO BELOW KNEE POPLITEAL ARTERY WITH LEFT GREATER SAPHENOUS VEIN;  Surgeon: Serafina Mitchell, MD;  Location: [facility];  Service: Vascular;  Laterality: Left;  . INCISIONAL HERNIA REPAIR N/A 01/19/2016   Procedure: HERNIA REPAIR INCISIONAL;  Surgeon: Leighton Ruff, MD;  Location: [facility];  Service: General;  Laterality: N/A;  . LAMINECTOMY AND MICRODISCECTOMY LUMBAR SPINE  03-26-2009   left L5 -- S1  . LAPAROSCOPIC SIGMOID COLECTOMY N/A 01/14/2016   Procedure: LAPAROSCOPIC SIGMOID  COLECTOMY;  Surgeon: Leighton Ruff, MD;  Location: [facility];  Service: General;  Laterality: N/A;  . LEFT HEART CATHETERIZATION WITH CORONARY ANGIOGRAM N/A 06/12/2012   Procedure: LEFT HEART CATHETERIZATION WITH CORONARY ANGIOGRAM;  Surgeon: Josue Hector, MD;  Location: [facility] CATH LAB;  Service: Cardiovascular;  Laterality: N/A;   No sig. CAD/  normal LVF, ef 55-65%  . LIVER BIOPSY N/A 01/14/2016   Procedure: LIVER BIOPSY;  Surgeon: Leighton Ruff, MD;  Location: [facility];  Service: General;  Laterality: N/A;  . LOWER EXTREMITY ANGIOGRAM Left 05/28/2014   Procedure: LOWER EXTREMITY ANGIOGRAM;  Surgeon: Wellington Hampshire, MD;  Location: [facility] CATH LAB;   Service: Cardiovascular;  Laterality: Left;  Failed PTA CTO  . POPLITEAL ARTERY ANGIOPLASTY Left 05/28/2014    dr Fletcher Anon   Attempted and unsuccessful due to inability to cross the occlusionnotes   . PORTACATH PLACEMENT Right 01/14/2016   Procedure: INSERTION PORT-A-CATH;  Surgeon: Leighton Ruff, MD;  Location: [facility];  Service: General;  Laterality: Right;  . POSTERIOR CERVICAL FUSION/FORAMINOTOMY N/A 12/10/2012   Procedure: POSTERIOR CERVICAL FUSION/FORAMINOTOMY LEVEL 1;  Surgeon: Ophelia Charter, MD;  Location: [facility] NEURO ORS;  Service: Neurosurgery;  Laterality: N/A;  Cervical three-four posterior cervical fusion with lateral mass screws  . POSTERIOR LUMBAR FUSION  11-11-2009;   07-15-2013   L5 -- S1;   L4-- S1  . SHOULDER ARTHROSCOPY Right 03-08-2004   debridement labral tear/  DCR/  acromioplasty  . TRANSTHORACIC ECHOCARDIOGRAM  01-05-2015   mild LVH/  ef 60-65%/  mild TR  . UMBILICAL HERNIA REPAIR  1980  . VEIN HARVEST Left 09/11/2015   Procedure: LEFT GREATER SAPHENOUS VEIN HARVEST;  Surgeon: Serafina Mitchell, MD;  Location: [facility];  Service: Vascular;  Laterality: Left;    Social History   Social History  . Marital status: Married    Spouse name: N/A  . Number of children: 5  . Years of education: N/A   Occupational History  . Disabled Unemployed   Social History Main Topics  . Smoking status: Never Smoker  . Smokeless tobacco: Never Used  . Alcohol use 0.0 oz/week     Comment: RARE  . Drug use: No  . Sexual activity: Not on file   Other Topics Concern  . Not on file   Social History Narrative   Married - wife works Surveyor, quantity unit @ [employer]   5 daughters   Regular exercise-no   Disabled to due back problem   12/04/2015       Current Outpatient Prescriptions on File Prior to Visit  Medication Sig Dispense Refill  . amLODipine (NORVASC) 5 MG tablet Take 1 tablet (5 mg total) by mouth daily. 90 tablet 3  . aspirin EC 81 MG tablet Take 81 mg by mouth daily.    Marland Kitchen  atorvastatin (LIPITOR) 40 MG tablet Take 40 mg by mouth every evening.     . diazepam (VALIUM) 10 MG tablet Take 10 mg by mouth at bedtime as needed for anxiety.   1  . docusate sodium (COLACE) 100 MG capsule Take 100 mg by mouth 2 (two) times daily.    Marland Kitchen levothyroxine (SYNTHROID, LEVOTHROID) 112 MCG tablet TAKE 1 TABLET BY MOUTH DAILY BEFORE BREAKFAST 90 tablet 0  . lidocaine-prilocaine (EMLA) cream Apply 1 application topically as needed. Apply to portacath site 1 hour prior to use 30 g 3  . LORazepam (ATIVAN) 0.5 MG tablet Take 1 tablet (0.5 mg total) by mouth every 8 (eight) hours  as needed for sleep (can also take for nausea). 30 tablet 0  . metoprolol (LOPRESSOR) 50 MG tablet Take 0.5 tablets (25 mg total) by mouth 2 (two) times daily. 90 tablet 3  . nitroGLYCERIN (NITROSTAT) 0.4 MG SL tablet PLACE 1 TABLET UNDER THE TONGUE EVERY 5 MINUTES AS NEEDED FOR CHEST PAIN. 25 tablet 0  . oxyCODONE-acetaminophen (PERCOCET) 10-325 MG tablet Take 1 tablet by mouth every 6 (six) hours as needed for pain (for back pain). 30 tablet 0  . pantoprazole (PROTONIX) 40 MG tablet Take 40 mg by mouth daily.   5  . prochlorperazine (COMPAZINE) 10 MG tablet Take 1 tablet (10 mg total) by mouth every 6 (six) hours as needed for nausea or vomiting. 30 tablet 3  . RELISTOR 150 MG TABS Take 450 mg by mouth daily.   2  . sildenafil (REVATIO) 20 MG tablet Take 20-100 mg by mouth daily as needed for erectile dysfunction.  11  . tamsulosin (FLOMAX) 0.4 MG CAPS capsule Take 0.4 mg by mouth every evening.     Marland Kitchen tiZANidine (ZANAFLEX) 4 MG tablet Take 4 mg by mouth 2 (two) times daily as needed for muscle spasms.   2  . venlafaxine XR (EFFEXOR-XR) 75 MG 24 hr capsule Take 75 mg by mouth daily with breakfast. Taking 150 mg daily     Current Facility-Administered Medications on File Prior to Visit  Medication Dose Route Frequency Provider Last Rate Last Dose  . alteplase (CATHFLO ACTIVASE) injection 2 mg  2 mg Intracatheter Once  PRN Ladell Pier, MD      . sodium chloride flush (NS) 0.9 % injection 10 mL  10 mL Intravenous PRN Ladell Pier, MD   10 mL at 02/04/16 1431    Allergies  Allergen Reactions  . Hydrocodone Hives    Family History  Problem Relation Age of Onset  . Thyroid disease Mother     hypothyroidism  . Heart attack Maternal Grandfather   . Heart Problems Father     pacermaker  . Edema Father   . Heart disease Maternal Grandmother   . Lung cancer Maternal Grandmother   . Diabetes Maternal Grandmother   . Hypertension Maternal Grandmother     BP 126/84   Pulse (!) 112   Ht 5\' 10"  (1.778 m)   Wt 245 lb (111.1 kg)   SpO2 97%   BMI 35.15 kg/m    Review of Systems No weight change.     Objective:   Physical Exam VITAL SIGNS:  See vs page GENERAL: no distress NECK: There is no palpable thyroid enlargement.  No thyroid nodule is palpable.  No palpable lymphadenopathy at the anterior neck.     Lab Results  Component Value Date   TSH 4.36 03/08/2016      Assessment & Plan:  Hypothyroidism: well-controlled Please continue the same medication Please return in 1 year.

## 2016-03-08 NOTE — Patient Instructions (Signed)
blood tests are requested for you today.  We'll let you know about the results.   There is a slight chance that the overactive thyroid could come back.    Please return in 1 year.

## 2016-03-11 ENCOUNTER — Other Ambulatory Visit: Payer: Self-pay | Admitting: *Deleted

## 2016-03-11 DIAGNOSIS — C187 Malignant neoplasm of sigmoid colon: Secondary | ICD-10-CM

## 2016-03-13 ENCOUNTER — Other Ambulatory Visit: Payer: Self-pay | Admitting: Oncology

## 2016-03-14 ENCOUNTER — Other Ambulatory Visit (HOSPITAL_BASED_OUTPATIENT_CLINIC_OR_DEPARTMENT_OTHER): Payer: Medicaid Other

## 2016-03-14 ENCOUNTER — Ambulatory Visit (HOSPITAL_BASED_OUTPATIENT_CLINIC_OR_DEPARTMENT_OTHER): Payer: Medicaid Other

## 2016-03-14 ENCOUNTER — Ambulatory Visit: Payer: Medicaid Other

## 2016-03-14 ENCOUNTER — Ambulatory Visit (HOSPITAL_BASED_OUTPATIENT_CLINIC_OR_DEPARTMENT_OTHER): Payer: Medicaid Other | Admitting: Nurse Practitioner

## 2016-03-14 ENCOUNTER — Telehealth: Payer: Self-pay | Admitting: Oncology

## 2016-03-14 VITALS — BP 115/76 | HR 108 | Temp 98.2°F | Resp 18 | Ht 70.0 in | Wt 251.6 lb

## 2016-03-14 DIAGNOSIS — Z95828 Presence of other vascular implants and grafts: Secondary | ICD-10-CM

## 2016-03-14 DIAGNOSIS — C187 Malignant neoplasm of sigmoid colon: Secondary | ICD-10-CM

## 2016-03-14 DIAGNOSIS — R911 Solitary pulmonary nodule: Secondary | ICD-10-CM

## 2016-03-14 DIAGNOSIS — F418 Other specified anxiety disorders: Secondary | ICD-10-CM

## 2016-03-14 DIAGNOSIS — C787 Secondary malignant neoplasm of liver and intrahepatic bile duct: Secondary | ICD-10-CM | POA: Diagnosis not present

## 2016-03-14 DIAGNOSIS — Z5111 Encounter for antineoplastic chemotherapy: Secondary | ICD-10-CM | POA: Diagnosis present

## 2016-03-14 DIAGNOSIS — I10 Essential (primary) hypertension: Secondary | ICD-10-CM

## 2016-03-14 DIAGNOSIS — C189 Malignant neoplasm of colon, unspecified: Secondary | ICD-10-CM

## 2016-03-14 LAB — CBC WITH DIFFERENTIAL/PLATELET
BASO%: 0.1 % (ref 0.0–2.0)
Basophils Absolute: 0 10*3/uL (ref 0.0–0.1)
EOS%: 3.2 % (ref 0.0–7.0)
Eosinophils Absolute: 0.2 10*3/uL (ref 0.0–0.5)
HCT: 40 % (ref 38.4–49.9)
HGB: 12.4 g/dL — ABNORMAL LOW (ref 13.0–17.1)
LYMPH%: 45.1 % (ref 14.0–49.0)
MCH: 24.4 pg — ABNORMAL LOW (ref 27.2–33.4)
MCHC: 31 g/dL — ABNORMAL LOW (ref 32.0–36.0)
MCV: 78.7 fL — ABNORMAL LOW (ref 79.3–98.0)
MONO#: 0.5 10*3/uL (ref 0.1–0.9)
MONO%: 10.3 % (ref 0.0–14.0)
NEUT#: 2 10*3/uL (ref 1.5–6.5)
NEUT%: 41.3 % (ref 39.0–75.0)
Platelets: 92 10*3/uL — ABNORMAL LOW (ref 140–400)
RBC: 5.08 10*6/uL (ref 4.20–5.82)
RDW: 15.8 % — ABNORMAL HIGH (ref 11.0–14.6)
WBC: 4.9 10*3/uL (ref 4.0–10.3)
lymph#: 2.2 10*3/uL (ref 0.9–3.3)

## 2016-03-14 LAB — COMPREHENSIVE METABOLIC PANEL
ALT: 65 U/L — ABNORMAL HIGH (ref 0–55)
AST: 39 U/L — ABNORMAL HIGH (ref 5–34)
Albumin: 3.4 g/dL — ABNORMAL LOW (ref 3.5–5.0)
Alkaline Phosphatase: 122 U/L (ref 40–150)
Anion Gap: 11 mEq/L (ref 3–11)
BUN: 7.8 mg/dL (ref 7.0–26.0)
CO2: 22 mEq/L (ref 22–29)
Calcium: 8.8 mg/dL (ref 8.4–10.4)
Chloride: 106 mEq/L (ref 98–109)
Creatinine: 1.1 mg/dL (ref 0.7–1.3)
EGFR: 90 mL/min/{1.73_m2} — ABNORMAL LOW (ref >90)
Glucose: 208 mg/dl — ABNORMAL HIGH (ref 70–140)
Potassium: 4.1 mEq/L (ref 3.5–5.1)
Sodium: 139 mEq/L (ref 136–145)
Total Bilirubin: 0.4 mg/dL (ref 0.20–1.20)
Total Protein: 7.5 g/dL (ref 6.4–8.3)

## 2016-03-14 MED ORDER — DEXTROSE 5 % IV SOLN
Freq: Once | INTRAVENOUS | Status: AC
  Administered 2016-03-14: 14:00:00 via INTRAVENOUS

## 2016-03-14 MED ORDER — PALONOSETRON HCL INJECTION 0.25 MG/5ML
0.2500 mg | Freq: Once | INTRAVENOUS | Status: AC
  Administered 2016-03-14: 0.25 mg via INTRAVENOUS

## 2016-03-14 MED ORDER — FLUOROURACIL CHEMO INJECTION 2.5 GM/50ML
400.0000 mg/m2 | Freq: Once | INTRAVENOUS | Status: AC
  Administered 2016-03-14: 950 mg via INTRAVENOUS
  Filled 2016-03-14: qty 19

## 2016-03-14 MED ORDER — PALONOSETRON HCL INJECTION 0.25 MG/5ML
INTRAVENOUS | Status: AC
  Filled 2016-03-14: qty 5

## 2016-03-14 MED ORDER — SODIUM CHLORIDE 0.9% FLUSH
10.0000 mL | INTRAVENOUS | Status: DC | PRN
  Administered 2016-03-14: 10 mL via INTRAVENOUS
  Filled 2016-03-14: qty 10

## 2016-03-14 MED ORDER — LEUCOVORIN CALCIUM INJECTION 350 MG
400.0000 mg/m2 | Freq: Once | INTRAMUSCULAR | Status: AC
  Administered 2016-03-14: 940 mg via INTRAVENOUS
  Filled 2016-03-14: qty 47

## 2016-03-14 MED ORDER — SODIUM CHLORIDE 0.9 % IV SOLN
Freq: Once | INTRAVENOUS | Status: AC
  Administered 2016-03-14: 13:00:00 via INTRAVENOUS
  Filled 2016-03-14: qty 5

## 2016-03-14 MED ORDER — DEXTROSE 5 % IV SOLN
85.0000 mg/m2 | Freq: Once | INTRAVENOUS | Status: AC
  Administered 2016-03-14: 200 mg via INTRAVENOUS
  Filled 2016-03-14: qty 40

## 2016-03-14 MED ORDER — SODIUM CHLORIDE 0.9 % IV SOLN
2400.0000 mg/m2 | INTRAVENOUS | Status: DC
  Administered 2016-03-14: 5650 mg via INTRAVENOUS
  Filled 2016-03-14: qty 113

## 2016-03-14 NOTE — Progress Notes (Signed)
Per Ned Card, NP ok to treat with Plt count of 92.

## 2016-03-14 NOTE — Telephone Encounter (Signed)
Appointments scheduled per 10/30 LOS. Patient given AVS report and calendars with future scheduled appointments. Patient informed of CT scan and given two bottles of contrast and instructions.

## 2016-03-14 NOTE — Patient Instructions (Signed)
[facility] Center Discharge Instructions for Patients Receiving Chemotherapy  Today you received the following chemotherapy agents 5 Fu/Leucovorin/Oxaliplatin To help prevent nausea and vomiting after your treatment, we encourage you to take your nausea medication as prescribed.  If you develop nausea and vomiting that is not controlled by your nausea medication, call the clinic.   BELOW ARE SYMPTOMS THAT SHOULD BE REPORTED IMMEDIATELY:  *FEVER GREATER THAN 100.5 F  *CHILLS WITH OR WITHOUT FEVER  NAUSEA AND VOMITING THAT IS NOT CONTROLLED WITH YOUR NAUSEA MEDICATION  *UNUSUAL SHORTNESS OF BREATH  *UNUSUAL BRUISING OR BLEEDING  TENDERNESS IN MOUTH AND THROAT WITH OR WITHOUT PRESENCE OF ULCERS  *URINARY PROBLEMS  *BOWEL PROBLEMS  UNUSUAL RASH Items with * indicate a potential emergency and should be followed up as soon as possible.  Feel free to call the clinic you have any questions or concerns. The clinic phone number is [phone].  Please show the CHEMO ALERT CARD at check-in to the Emergency Department and triage nurse.   

## 2016-03-14 NOTE — Progress Notes (Signed)
  [place] OFFICE PROGRESS NOTE   Diagnosis:  Colon cancer  INTERVAL HISTORY:   [name] returns as scheduled. He completed cycle 3 FOLFOX 02/29/2016. No significant nausea. No vomiting. No mouth sores. He notes a dry mouth intermittently. No diarrhea. Cold sensitivity lasted 7 days. No persistent neuropathy symptoms.  Objective:  Vital signs in last 24 hours:  Blood pressure 115/76, pulse (!) 108, temperature 98.2 F (36.8 C), temperature source Oral, resp. rate 18, height 5' 10" (1.778 m), weight 251 lb 9.6 oz (114.1 kg), SpO2 100 %.    HEENT: No thrush or ulcers. Resp: Lungs clear bilaterally. Cardio: Regular rate and rhythm. GI: Abdomen soft and nontender. No hepatomegaly. Vascular: No leg edema. Neuro: Vibratory sense intact over the fingertips per tuning fork exam.  Skin: Palms without erythema. Port-A-Cath without erythema.    Lab Results:  Lab Results  Component Value Date   WBC 4.9 03/14/2016   HGB 12.4 (L) 03/14/2016   HCT 40.0 03/14/2016   MCV 78.7 (L) 03/14/2016   PLT 92 (L) 03/14/2016   NEUTROABS 2.0 03/14/2016    Imaging:  No results found.  Medications: I have reviewed the patient's current medications.  Assessment/Plan: 1. 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 CTschest, abdomen, and pelvis on 12/11/2015-indeterminate tiny pulmonary nodules, multiple liver metastases, small nodes in the sigmoid mesocolon ? Laparoscopic sigmoid colectomy, liver biopsy, Port-A-Cath placement 01/14/2016 ? Pathology sigmoid colon resection-colonic adenocarcinoma, 5.3 cm extending into pericolonic connective tissue, positive lymph-vascular invasion, positive perineural invasion, negative margins, metastatic carcinoma in9 of 28 lymph nodes ? Pathology liver biopsy-metastatic colorectal adenocarcinoma ? MSI stable; mismatch repair protein normal ? APC alteration, K  RAS wild-type, no BRAF mutation ? Cycle 1 FOLFOX 02/02/2016 ? Cycle 2 FOLFOX 02/15/2016 ? Cycle 3 FOLFOX 02/29/2016 ? Cycle 4 FOLFOX 03/14/2016  2. Rectal bleeding and constipation secondary to #1  3. History of peripheral vascular disease, status post left lower extremity vascular bypass surgery in April 2017  4. History of nephrolithiasis  5. History of Graves' disease treated with radioactive iodine  6. Anxiety/depression  7. Hypertension  8. Hospitalization 01/19/2016 with wound dehiscence status post secondary suture closure of abdominal wall   Disposition: Mr. Seidner appears stable. He has completed 3 cycles of FOLFOX. Plan to proceed with cycle 4 today as scheduled.   We discussed the decreased platelet count. He understands to contact the office with any bleeding.  He will return for a follow-up visit and cycle 5 FOLFOX in 2 weeks. He will contact the office in the interim as outlined above or with any other problems.    Ned Card ANP/GNP-BC   03/14/2016  10:58 AM

## 2016-03-15 ENCOUNTER — Telehealth: Payer: Self-pay | Admitting: *Deleted

## 2016-03-15 NOTE — Telephone Encounter (Signed)
Per the LOS I have scheduled appts and notified the scheduler 

## 2016-03-16 ENCOUNTER — Other Ambulatory Visit: Payer: Self-pay | Admitting: Nurse Practitioner

## 2016-03-16 ENCOUNTER — Ambulatory Visit (HOSPITAL_BASED_OUTPATIENT_CLINIC_OR_DEPARTMENT_OTHER): Payer: Medicaid Other

## 2016-03-16 VITALS — BP 113/64 | HR 104 | Temp 98.4°F | Resp 18

## 2016-03-16 DIAGNOSIS — Z452 Encounter for adjustment and management of vascular access device: Secondary | ICD-10-CM | POA: Diagnosis not present

## 2016-03-16 DIAGNOSIS — C187 Malignant neoplasm of sigmoid colon: Secondary | ICD-10-CM

## 2016-03-16 DIAGNOSIS — C787 Secondary malignant neoplasm of liver and intrahepatic bile duct: Secondary | ICD-10-CM

## 2016-03-16 DIAGNOSIS — C189 Malignant neoplasm of colon, unspecified: Secondary | ICD-10-CM

## 2016-03-16 MED ORDER — HEPARIN SOD (PORK) LOCK FLUSH 100 UNIT/ML IV SOLN
500.0000 [IU] | Freq: Once | INTRAVENOUS | Status: AC | PRN
  Administered 2016-03-16: 500 [IU]
  Filled 2016-03-16: qty 5

## 2016-03-16 MED ORDER — SODIUM CHLORIDE 0.9% FLUSH
10.0000 mL | INTRAVENOUS | Status: DC | PRN
  Administered 2016-03-16: 10 mL
  Filled 2016-03-16: qty 10

## 2016-03-21 ENCOUNTER — Telehealth: Payer: Self-pay | Admitting: Endocrinology

## 2016-03-21 ENCOUNTER — Telehealth: Payer: Self-pay | Admitting: *Deleted

## 2016-03-21 NOTE — Telephone Encounter (Signed)
See message and please advise, Thanks!  

## 2016-03-21 NOTE — Telephone Encounter (Signed)
Thyroid was normal, so please ask PCP about symptoms.

## 2016-03-21 NOTE — Telephone Encounter (Signed)
What was the results of the blood work he completely  He is still experiencing dizziness

## 2016-03-21 NOTE — Telephone Encounter (Signed)
"  [facility] calling about 03-16-2016 prescription for Lorazepam written by Ms. [name] NP.  Patient has order for another drug in this class by Dr. Doree Fudge."  Asked if a new order for diazepam has been received.  EPIC shows this as historical back on 08-10-2015."  I just needed to know if you all were aware of this medicine.  Thank you."  Call ended.

## 2016-03-21 NOTE — Telephone Encounter (Signed)
I contacted the patient and advised of message via voicemail. Requested a call back if the patient would like to discuss.  

## 2016-03-27 ENCOUNTER — Other Ambulatory Visit: Payer: Self-pay | Admitting: Oncology

## 2016-03-28 ENCOUNTER — Telehealth: Payer: Self-pay | Admitting: *Deleted

## 2016-03-28 ENCOUNTER — Other Ambulatory Visit (HOSPITAL_BASED_OUTPATIENT_CLINIC_OR_DEPARTMENT_OTHER): Payer: Medicaid Other

## 2016-03-28 ENCOUNTER — Ambulatory Visit (HOSPITAL_BASED_OUTPATIENT_CLINIC_OR_DEPARTMENT_OTHER): Payer: Medicaid Other | Admitting: Oncology

## 2016-03-28 ENCOUNTER — Ambulatory Visit (HOSPITAL_BASED_OUTPATIENT_CLINIC_OR_DEPARTMENT_OTHER): Payer: Medicaid Other

## 2016-03-28 ENCOUNTER — Ambulatory Visit: Payer: Medicaid Other

## 2016-03-28 ENCOUNTER — Telehealth: Payer: Self-pay | Admitting: Oncology

## 2016-03-28 VITALS — BP 109/73 | HR 90 | Temp 98.2°F | Resp 18 | Ht 70.0 in | Wt 252.5 lb

## 2016-03-28 DIAGNOSIS — C787 Secondary malignant neoplasm of liver and intrahepatic bile duct: Secondary | ICD-10-CM

## 2016-03-28 DIAGNOSIS — C187 Malignant neoplasm of sigmoid colon: Secondary | ICD-10-CM

## 2016-03-28 DIAGNOSIS — Z5111 Encounter for antineoplastic chemotherapy: Secondary | ICD-10-CM | POA: Diagnosis present

## 2016-03-28 DIAGNOSIS — F418 Other specified anxiety disorders: Secondary | ICD-10-CM

## 2016-03-28 DIAGNOSIS — I10 Essential (primary) hypertension: Secondary | ICD-10-CM | POA: Diagnosis not present

## 2016-03-28 DIAGNOSIS — Z95828 Presence of other vascular implants and grafts: Secondary | ICD-10-CM

## 2016-03-28 DIAGNOSIS — R911 Solitary pulmonary nodule: Secondary | ICD-10-CM | POA: Diagnosis not present

## 2016-03-28 LAB — COMPREHENSIVE METABOLIC PANEL
ALT: 45 U/L (ref 0–55)
AST: 34 U/L (ref 5–34)
Albumin: 3.2 g/dL — ABNORMAL LOW (ref 3.5–5.0)
Alkaline Phosphatase: 125 U/L (ref 40–150)
Anion Gap: 11 mEq/L (ref 3–11)
BUN: 6.3 mg/dL — ABNORMAL LOW (ref 7.0–26.0)
CO2: 23 mEq/L (ref 22–29)
Calcium: 8.8 mg/dL (ref 8.4–10.4)
Chloride: 106 mEq/L (ref 98–109)
Creatinine: 1 mg/dL (ref 0.7–1.3)
EGFR: >90 mL/min/{1.73_m2} (ref >90)
Glucose: 190 mg/dl — ABNORMAL HIGH (ref 70–140)
Potassium: 3.8 mEq/L (ref 3.5–5.1)
Sodium: 140 mEq/L (ref 136–145)
Total Bilirubin: 0.4 mg/dL (ref 0.20–1.20)
Total Protein: 7.1 g/dL (ref 6.4–8.3)

## 2016-03-28 LAB — CBC WITH DIFFERENTIAL/PLATELET
BASO%: 0.2 % (ref 0.0–2.0)
Basophils Absolute: 0 10*3/uL (ref 0.0–0.1)
EOS%: 3.2 % (ref 0.0–7.0)
Eosinophils Absolute: 0.1 10*3/uL (ref 0.0–0.5)
HCT: 37.2 % — ABNORMAL LOW (ref 38.4–49.9)
HGB: 11.6 g/dL — ABNORMAL LOW (ref 13.0–17.1)
LYMPH%: 42.5 % (ref 14.0–49.0)
MCH: 24.4 pg — ABNORMAL LOW (ref 27.2–33.4)
MCHC: 31.1 g/dL — ABNORMAL LOW (ref 32.0–36.0)
MCV: 78.6 fL — ABNORMAL LOW (ref 79.3–98.0)
MONO#: 0.5 10*3/uL (ref 0.1–0.9)
MONO%: 10.4 % (ref 0.0–14.0)
NEUT#: 1.9 10*3/uL (ref 1.5–6.5)
NEUT%: 43.7 % (ref 39.0–75.0)
Platelets: 89 10*3/uL — ABNORMAL LOW (ref 140–400)
RBC: 4.73 10*6/uL (ref 4.20–5.82)
RDW: 17.3 % — ABNORMAL HIGH (ref 11.0–14.6)
WBC: 4.4 10*3/uL (ref 4.0–10.3)
lymph#: 1.8 10*3/uL (ref 0.9–3.3)

## 2016-03-28 LAB — CEA (IN HOUSE-CHCC): CEA (CHCC-In House): 10.98 ng/mL — ABNORMAL HIGH (ref 0.00–5.00)

## 2016-03-28 MED ORDER — SODIUM CHLORIDE 0.9% FLUSH
10.0000 mL | INTRAVENOUS | Status: DC | PRN
  Administered 2016-03-28: 10 mL via INTRAVENOUS
  Filled 2016-03-28: qty 10

## 2016-03-28 MED ORDER — PALONOSETRON HCL INJECTION 0.25 MG/5ML
0.2500 mg | Freq: Once | INTRAVENOUS | Status: AC
  Administered 2016-03-28: 0.25 mg via INTRAVENOUS

## 2016-03-28 MED ORDER — FLUOROURACIL CHEMO INJECTION 2.5 GM/50ML
400.0000 mg/m2 | Freq: Once | INTRAVENOUS | Status: AC
  Administered 2016-03-28: 950 mg via INTRAVENOUS
  Filled 2016-03-28: qty 19

## 2016-03-28 MED ORDER — PALONOSETRON HCL INJECTION 0.25 MG/5ML
INTRAVENOUS | Status: AC
  Filled 2016-03-28: qty 5

## 2016-03-28 MED ORDER — FLUOROURACIL CHEMO INJECTION 5 GM/100ML
2400.0000 mg/m2 | INTRAVENOUS | Status: DC
  Administered 2016-03-28: 5650 mg via INTRAVENOUS
  Filled 2016-03-28: qty 113

## 2016-03-28 MED ORDER — SODIUM CHLORIDE 0.9 % IV SOLN
Freq: Once | INTRAVENOUS | Status: AC
  Administered 2016-03-28: 10:00:00 via INTRAVENOUS
  Filled 2016-03-28: qty 5

## 2016-03-28 MED ORDER — OXALIPLATIN CHEMO INJECTION 100 MG/20ML
85.0000 mg/m2 | Freq: Once | INTRAVENOUS | Status: AC
  Administered 2016-03-28: 200 mg via INTRAVENOUS
  Filled 2016-03-28: qty 40

## 2016-03-28 MED ORDER — DEXTROSE 5 % IV SOLN
400.0000 mg/m2 | Freq: Once | INTRAVENOUS | Status: AC
  Administered 2016-03-28: 940 mg via INTRAVENOUS
  Filled 2016-03-28: qty 47

## 2016-03-28 MED ORDER — DEXTROSE 5 % IV SOLN
Freq: Once | INTRAVENOUS | Status: AC
  Administered 2016-03-28: 11:00:00 via INTRAVENOUS

## 2016-03-28 NOTE — Telephone Encounter (Signed)
Per LOS I have scheduled appts and notified the scheduler 

## 2016-03-28 NOTE — Progress Notes (Signed)
Per Dr. Benay Spice, ok to treat with current platelet count and Creatinine

## 2016-03-28 NOTE — Progress Notes (Signed)
  [place] OFFICE PROGRESS NOTE   Diagnosis: Colon cancer  INTERVAL HISTORY:   Mr. Delpriore returns as scheduled. He completed another cycle of FOLFOX on 03/14/2016. He tolerated chemotherapy well. He reports a dry mouth with white discoloration at the buccal mucosa. No diarrhea or nausea/vomiting. Cold sensitivity lasted for approximately one week following chemotherapy. No neuropathy symptoms at present.  Objective:  Vital signs in last 24 hours:  Blood pressure 109/73, pulse 90, temperature 98.2 F (36.8 C), temperature source Oral, resp. rate 18, height '5\' 10"'$  (1.778 m), weight 252 lb 8 oz (114.5 kg), SpO2 100 %.    HEENT: No thrush or ulcers Resp: Lungs clear bilaterally Cardio: Regular rate and rhythm GI: No hepatomegaly, nontender Vascular: No leg edema Neuro: The vibratory sense is intact at the fingertips bilaterally  Skin: Palms without erythema   Portacath/PICC-without erythema   Lab Results:  Lab Results  Component Value Date   WBC 4.4 03/28/2016   HGB 11.6 (L) 03/28/2016   HCT 37.2 (L) 03/28/2016   MCV 78.6 (L) 03/28/2016   PLT 89 (L) 03/28/2016   NEUTROABS 1.9 03/28/2016     Medications: I have reviewed the patient's current medications.  Assessment/Plan: 1. 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 CTschest, abdomen, and pelvis on 12/11/2015-indeterminate tiny pulmonary nodules, multiple liver metastases, small nodes in the sigmoid mesocolon ? Laparoscopic sigmoid colectomy, liver biopsy, Port-A-Cath placement 01/14/2016 ? Pathology sigmoid colon resection-colonic adenocarcinoma, 5.3 cm extending into pericolonic connective tissue, positive lymph-vascular invasion, positive perineural invasion, negative margins, metastatic carcinoma in9 of 28 lymph nodes ? Pathology liver biopsy-metastatic colorectal adenocarcinoma ? MSI stable; mismatch repair protein  normal ? APC alteration, K RAS wild-type, no BRAF mutation ? Cycle 1 FOLFOX 02/02/2016 ? Cycle 2 FOLFOX 02/15/2016 ? Cycle 3 FOLFOX 02/29/2016 ? Cycle 4 FOLFOX 03/14/2016 ? Cycle 5 FOLFOX 03/28/2016  2. Rectal bleeding and constipation secondary to #1  3. History of peripheral vascular disease, status post left lower extremity vascular bypass surgery in April 2017  4. History of nephrolithiasis  5. History of Graves' disease treated with radioactive iodine  6. Anxiety/depression  7. Hypertension  8. Hospitalization 01/19/2016 with wound dehiscence status post secondary suture closure of abdominal wall     Disposition:  [name] appears well. He is tolerating the chemotherapy well. He will complete cycle 5 FOLFOX today. Mr. Bastyr is scheduled for a restaging CT after this cycle.  He will return for an office visit in 2 weeks.  Betsy Coder, MD  03/28/2016  9:31 AM

## 2016-03-28 NOTE — Patient Instructions (Signed)
[facility] Discharge Instructions for Patients Receiving Chemotherapy  Today you received the following chemotherapy agents:  Oxaliplatin, 5FU, and Leucovorin.  To help prevent nausea and vomiting after your treatment, we encourage you to take your nausea medication as directed.   If you develop nausea and vomiting that is not controlled by your nausea medication, call the clinic.   BELOW ARE SYMPTOMS THAT SHOULD BE REPORTED IMMEDIATELY:  *FEVER GREATER THAN 100.5 F  *CHILLS WITH OR WITHOUT FEVER  NAUSEA AND VOMITING THAT IS NOT CONTROLLED WITH YOUR NAUSEA MEDICATION  *UNUSUAL SHORTNESS OF BREATH  *UNUSUAL BRUISING OR BLEEDING  TENDERNESS IN MOUTH AND THROAT WITH OR WITHOUT PRESENCE OF ULCERS  *URINARY PROBLEMS  *BOWEL PROBLEMS  UNUSUAL RASH Items with * indicate a potential emergency and should be followed up as soon as possible.  Feel free to call the clinic you have any questions or concerns. The clinic phone number is [phone].  Please show the [facility] at check-in to the Emergency Department and triage nurse.

## 2016-03-28 NOTE — Telephone Encounter (Signed)
Appointments scheduled per 11/13 LOS. Patient given AVS report and calendars with future scheduled appointments. °

## 2016-03-29 ENCOUNTER — Telehealth: Payer: Self-pay | Admitting: *Deleted

## 2016-03-29 NOTE — Telephone Encounter (Signed)
-----   Message from Ladell Pier, MD sent at 03/28/2016  4:52 PM EST ----- Please call patient, cea is better, f/u as scheduled

## 2016-03-29 NOTE — Telephone Encounter (Signed)
Left message on voicemail for pt to call office for labs. (CEA is better, per Dr. Benay Spice.)

## 2016-03-30 ENCOUNTER — Ambulatory Visit (HOSPITAL_BASED_OUTPATIENT_CLINIC_OR_DEPARTMENT_OTHER): Payer: Medicaid Other

## 2016-03-30 VITALS — BP 111/80 | HR 90 | Temp 97.8°F | Resp 20

## 2016-03-30 DIAGNOSIS — C187 Malignant neoplasm of sigmoid colon: Secondary | ICD-10-CM

## 2016-03-30 DIAGNOSIS — C787 Secondary malignant neoplasm of liver and intrahepatic bile duct: Secondary | ICD-10-CM

## 2016-03-30 DIAGNOSIS — Z452 Encounter for adjustment and management of vascular access device: Secondary | ICD-10-CM

## 2016-03-30 MED ORDER — SODIUM CHLORIDE 0.9% FLUSH
10.0000 mL | INTRAVENOUS | Status: DC | PRN
  Administered 2016-03-30: 10 mL
  Filled 2016-03-30: qty 10

## 2016-03-30 MED ORDER — HEPARIN SOD (PORK) LOCK FLUSH 100 UNIT/ML IV SOLN
500.0000 [IU] | Freq: Once | INTRAVENOUS | Status: AC | PRN
  Administered 2016-03-30: 500 [IU]
  Filled 2016-03-30: qty 5

## 2016-03-30 NOTE — Patient Instructions (Signed)

## 2016-04-04 ENCOUNTER — Telehealth: Payer: Self-pay | Admitting: *Deleted

## 2016-04-04 NOTE — Telephone Encounter (Signed)
Left message for patient to call office back regarding call that was placed to answering service on 04/01/16 regarding painful white patches to mouth.

## 2016-04-05 ENCOUNTER — Telehealth: Payer: Self-pay | Admitting: Nurse Practitioner

## 2016-04-06 ENCOUNTER — Ambulatory Visit (HOSPITAL_COMMUNITY): Payer: Medicaid Other

## 2016-04-10 ENCOUNTER — Other Ambulatory Visit: Payer: Self-pay | Admitting: Oncology

## 2016-04-11 ENCOUNTER — Encounter: Payer: Medicaid Other | Admitting: Genetic Counselor

## 2016-04-11 ENCOUNTER — Ambulatory Visit (HOSPITAL_BASED_OUTPATIENT_CLINIC_OR_DEPARTMENT_OTHER): Payer: Medicaid Other

## 2016-04-11 ENCOUNTER — Other Ambulatory Visit (HOSPITAL_BASED_OUTPATIENT_CLINIC_OR_DEPARTMENT_OTHER): Payer: Medicaid Other

## 2016-04-11 ENCOUNTER — Encounter: Payer: Self-pay | Admitting: *Deleted

## 2016-04-11 ENCOUNTER — Ambulatory Visit (HOSPITAL_BASED_OUTPATIENT_CLINIC_OR_DEPARTMENT_OTHER): Payer: Medicaid Other | Admitting: Oncology

## 2016-04-11 ENCOUNTER — Other Ambulatory Visit: Payer: Medicaid Other

## 2016-04-11 ENCOUNTER — Ambulatory Visit: Payer: Medicaid Other

## 2016-04-11 VITALS — BP 125/87 | HR 92 | Temp 98.3°F | Resp 20 | Ht 70.0 in | Wt 255.5 lb

## 2016-04-11 DIAGNOSIS — C187 Malignant neoplasm of sigmoid colon: Secondary | ICD-10-CM

## 2016-04-11 DIAGNOSIS — Z5111 Encounter for antineoplastic chemotherapy: Secondary | ICD-10-CM

## 2016-04-11 DIAGNOSIS — C787 Secondary malignant neoplasm of liver and intrahepatic bile duct: Secondary | ICD-10-CM

## 2016-04-11 DIAGNOSIS — C189 Malignant neoplasm of colon, unspecified: Secondary | ICD-10-CM

## 2016-04-11 DIAGNOSIS — Z452 Encounter for adjustment and management of vascular access device: Secondary | ICD-10-CM | POA: Diagnosis not present

## 2016-04-11 DIAGNOSIS — Z95828 Presence of other vascular implants and grafts: Secondary | ICD-10-CM

## 2016-04-11 DIAGNOSIS — F418 Other specified anxiety disorders: Secondary | ICD-10-CM

## 2016-04-11 DIAGNOSIS — R911 Solitary pulmonary nodule: Secondary | ICD-10-CM | POA: Diagnosis not present

## 2016-04-11 DIAGNOSIS — D6959 Other secondary thrombocytopenia: Secondary | ICD-10-CM | POA: Diagnosis not present

## 2016-04-11 DIAGNOSIS — I10 Essential (primary) hypertension: Secondary | ICD-10-CM | POA: Diagnosis not present

## 2016-04-11 LAB — COMPREHENSIVE METABOLIC PANEL
ALT: 118 U/L — ABNORMAL HIGH (ref 0–55)
AST: 79 U/L — ABNORMAL HIGH (ref 5–34)
Albumin: 3.2 g/dL — ABNORMAL LOW (ref 3.5–5.0)
Alkaline Phosphatase: 116 U/L (ref 40–150)
Anion Gap: 12 mEq/L — ABNORMAL HIGH (ref 3–11)
BUN: 8.3 mg/dL (ref 7.0–26.0)
CO2: 21 mEq/L — ABNORMAL LOW (ref 22–29)
Calcium: 8.8 mg/dL (ref 8.4–10.4)
Chloride: 104 mEq/L (ref 98–109)
Creatinine: 1.1 mg/dL (ref 0.7–1.3)
EGFR: >90 mL/min/{1.73_m2} (ref >90)
Glucose: 256 mg/dl — ABNORMAL HIGH (ref 70–140)
Potassium: 3.8 mEq/L (ref 3.5–5.1)
Sodium: 137 mEq/L (ref 136–145)
Total Bilirubin: 0.41 mg/dL (ref 0.20–1.20)
Total Protein: 7.3 g/dL (ref 6.4–8.3)

## 2016-04-11 LAB — CBC WITH DIFFERENTIAL/PLATELET
BASO%: 0.2 % (ref 0.0–2.0)
Basophils Absolute: 0 10*3/uL (ref 0.0–0.1)
EOS%: 3.5 % (ref 0.0–7.0)
Eosinophils Absolute: 0.2 10*3/uL (ref 0.0–0.5)
HCT: 38.5 % (ref 38.4–49.9)
HGB: 12 g/dL — ABNORMAL LOW (ref 13.0–17.1)
LYMPH%: 38.2 % (ref 14.0–49.0)
MCH: 24.4 pg — ABNORMAL LOW (ref 27.2–33.4)
MCHC: 31 g/dL — ABNORMAL LOW (ref 32.0–36.0)
MCV: 78.7 fL — ABNORMAL LOW (ref 79.3–98.0)
MONO#: 0.5 10*3/uL (ref 0.1–0.9)
MONO%: 9.6 % (ref 0.0–14.0)
NEUT#: 2.4 10*3/uL (ref 1.5–6.5)
NEUT%: 48.5 % (ref 39.0–75.0)
Platelets: 78 10*3/uL — ABNORMAL LOW (ref 140–400)
RBC: 4.9 10*6/uL (ref 4.20–5.82)
RDW: 19.4 % — ABNORMAL HIGH (ref 11.0–14.6)
WBC: 5 10*3/uL (ref 4.0–10.3)
lymph#: 1.9 10*3/uL (ref 0.9–3.3)

## 2016-04-11 MED ORDER — SODIUM CHLORIDE 0.9 % IV SOLN
INTRAVENOUS | Status: DC
  Administered 2016-04-11: 10:00:00 via INTRAVENOUS

## 2016-04-11 MED ORDER — SODIUM CHLORIDE 0.9% FLUSH
10.0000 mL | INTRAVENOUS | Status: DC | PRN
  Administered 2016-04-11: 10 mL via INTRAVENOUS
  Filled 2016-04-11: qty 10

## 2016-04-11 MED ORDER — FLUOROURACIL CHEMO INJECTION 2.5 GM/50ML
400.0000 mg/m2 | Freq: Once | INTRAVENOUS | Status: AC
  Administered 2016-04-11: 950 mg via INTRAVENOUS
  Filled 2016-04-11: qty 19

## 2016-04-11 MED ORDER — SODIUM CHLORIDE 0.9 % IV SOLN
2400.0000 mg/m2 | INTRAVENOUS | Status: DC
  Administered 2016-04-11: 5650 mg via INTRAVENOUS
  Filled 2016-04-11: qty 113

## 2016-04-11 MED ORDER — LEUCOVORIN CALCIUM INJECTION 350 MG
400.0000 mg/m2 | Freq: Once | INTRAMUSCULAR | Status: AC
  Administered 2016-04-11: 940 mg via INTRAVENOUS
  Filled 2016-04-11: qty 47

## 2016-04-11 MED ORDER — ALTEPLASE 2 MG IJ SOLR
2.0000 mg | Freq: Once | INTRAMUSCULAR | Status: AC | PRN
  Administered 2016-04-11: 2 mg
  Filled 2016-04-11: qty 2

## 2016-04-11 NOTE — Patient Instructions (Signed)
[facility] Discharge Instructions for Patients Receiving Chemotherapy  Today you received the following chemotherapy agents Leucovorin and 5FU.  To help prevent nausea and vomiting after your treatment, we encourage you to take your nausea medication.  If you develop nausea and vomiting that is not controlled by your nausea medication, call the clinic.   BELOW ARE SYMPTOMS THAT SHOULD BE REPORTED IMMEDIATELY:  *FEVER GREATER THAN 100.5 F  *CHILLS WITH OR WITHOUT FEVER  NAUSEA AND VOMITING THAT IS NOT CONTROLLED WITH YOUR NAUSEA MEDICATION  *UNUSUAL SHORTNESS OF BREATH  *UNUSUAL BRUISING OR BLEEDING  TENDERNESS IN MOUTH AND THROAT WITH OR WITHOUT PRESENCE OF ULCERS  *URINARY PROBLEMS  *BOWEL PROBLEMS  UNUSUAL RASH Items with * indicate a potential emergency and should be followed up as soon as possible.  Feel free to call the clinic you have any questions or concerns. The clinic phone number is [phone].  Please show the [place] at check-in to the Emergency Department and triage nurse.

## 2016-04-11 NOTE — Progress Notes (Signed)
Treat despite PLT count 78k, per Dr. Benay Spice. Oxaliplatin and pre-meds held for this cycle.

## 2016-04-11 NOTE — Progress Notes (Signed)
  [place] OFFICE PROGRESS NOTE   Diagnosis: Colon cancer  INTERVAL HISTORY:   Mr. Maya returns as scheduled. He completed another cycle of FOLFOX on 03/28/2016. No nausea/vomiting or diarrhea following chemotherapy. He reports cold sensitivity lasted until 04/05/2016. No neuropathy symptoms today. He developed a "sore throat "last week. He notes pain when he drinks carbonated liquids. He tolerates other liquids well.  Objective:  Vital signs in last 24 hours:  Blood pressure 125/87, pulse 92, temperature 98.3 F (36.8 C), temperature source Oral, resp. rate 20, height '5\' 10"'$  (1.778 m), weight 255 lb 8 oz (115.9 kg), SpO2 100 %.    HEENT: No thrush or ulcers, pharynx without exudate. Resp: Lungs clear bilaterally Cardio: Regular rate and rhythm GI: No hepatomegaly, nontender Vascular: No leg edema Neuro: Mild loss of vibratory sense at the fingertips bilaterally     Portacath/PICC-without erythema  Lab Results:  Lab Results  Component Value Date   WBC 5.0 04/11/2016   HGB 12.0 (L) 04/11/2016   HCT 38.5 04/11/2016   MCV 78.7 (L) 04/11/2016   PLT 78 (L) 04/11/2016   NEUTROABS 2.4 04/11/2016   03/28/2016: CEA-10.98 Medications: I have reviewed the patient's current medications.  Assessment/Plan: 1. 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 CTschest, abdomen, and pelvis on 12/11/2015-indeterminate tiny pulmonary nodules, multiple liver metastases, small nodes in the sigmoid mesocolon ? Laparoscopic sigmoid colectomy, liver biopsy, Port-A-Cath placement 01/14/2016 ? Pathology sigmoid colon resection-colonic adenocarcinoma, 5.3 cm extending into pericolonic connective tissue, positive lymph-vascular invasion, positive perineural invasion, negative margins, metastatic carcinoma in9 of 28 lymph nodes ? Pathology liver biopsy-metastatic colorectal adenocarcinoma ? MSI  stable; mismatch repair protein normal ? APC alteration, K RAS wild-type, no BRAF mutation ? Cycle 1 FOLFOX 02/02/2016 ? Cycle 2 FOLFOX 02/15/2016 ? Cycle 3 FOLFOX 02/29/2016 ? Cycle 4 FOLFOX 03/14/2016 ? Cycle 5 FOLFOX 03/28/2016 ? Cycle 6 FOLFOX 04/11/2016 (oxaliplatin held secondary to thrombocytopenia)   2. Rectal bleeding and constipation secondary to #1  3. History of peripheral vascular disease, status post left lower extremity vascular bypass surgery in April 2017  4. History of nephrolithiasis  5. History of Graves' disease treated with radioactive iodine  6. Anxiety/depression  7. Hypertension  8. Hospitalization 01/19/2016 with wound dehiscence status post secondary suture closure of abdominal wall  9.   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 Dobbins has competed 5 cycles of FOLFOX. The CEA is much improved. He is tolerating the chemotherapy well. He has thrombocytopenia secondary to chemotherapy. Oxaliplatin will be held from the chemotherapy regimen today. He will return for an office visit and chemotherapy in 2 weeks.  His insurance company has not approved the restaging CT scans. We will try to get the CT scans scheduled prior to the next office visit.  Betsy Coder, MD  04/11/2016  9:52 AM

## 2016-04-11 NOTE — Progress Notes (Signed)
Oncology Nurse Navigator Documentation  Oncology Nurse Navigator Flowsheets 04/11/2016  Navigator Location [facility]  Referral date to RadOnc/MedOnc -  Navigator Encounter Type Treatment  Telephone -  Abnormal Finding Date -  Confirmed Diagnosis Date -  Surgery Date -  Treatment Initiated Date -  Patient Visit Type MedOnc  Treatment Phase Active Tx--FOLFOX  Barriers/Navigation Needs No barriers at this time;No Questions;No Needs  Education -  Interventions None required  Referrals -  Coordination of Care -  Support Groups/Services -  Acuity -  Time Spent with Patient 15  Met w/patient and his wife in infusion area. Feeling well and has no navigation needs.

## 2016-04-12 ENCOUNTER — Telehealth: Payer: Self-pay | Admitting: *Deleted

## 2016-04-12 ENCOUNTER — Other Ambulatory Visit: Payer: Self-pay | Admitting: *Deleted

## 2016-04-12 MED ORDER — MAGIC MOUTHWASH: 5.0000 mL | Freq: Four times a day (QID) | ORAL | 1 refills | Status: DC | PRN

## 2016-04-12 NOTE — Telephone Encounter (Signed)
Left message on voicemail informing pt's wife that MMW has been called in.

## 2016-04-12 NOTE — Telephone Encounter (Signed)
"  Denardo, Higgs was told he'd receive an order for MMW called to the [place] on [address] at [facility].  Pharmacy has not received order yet and he really needs this."

## 2016-04-13 ENCOUNTER — Other Ambulatory Visit: Payer: Self-pay | Admitting: Nurse Practitioner

## 2016-04-13 ENCOUNTER — Telehealth: Payer: Self-pay

## 2016-04-13 ENCOUNTER — Ambulatory Visit (HOSPITAL_BASED_OUTPATIENT_CLINIC_OR_DEPARTMENT_OTHER): Payer: Medicaid Other

## 2016-04-13 VITALS — BP 129/77 | HR 94 | Temp 97.8°F | Resp 18

## 2016-04-13 DIAGNOSIS — C787 Secondary malignant neoplasm of liver and intrahepatic bile duct: Secondary | ICD-10-CM

## 2016-04-13 DIAGNOSIS — C187 Malignant neoplasm of sigmoid colon: Secondary | ICD-10-CM

## 2016-04-13 DIAGNOSIS — Z452 Encounter for adjustment and management of vascular access device: Secondary | ICD-10-CM | POA: Diagnosis present

## 2016-04-13 DIAGNOSIS — C189 Malignant neoplasm of colon, unspecified: Secondary | ICD-10-CM

## 2016-04-13 MED ORDER — SODIUM CHLORIDE 0.9% FLUSH
10.0000 mL | INTRAVENOUS | Status: DC | PRN
  Administered 2016-04-13: 10 mL
  Filled 2016-04-13: qty 10

## 2016-04-13 MED ORDER — HEPARIN SOD (PORK) LOCK FLUSH 100 UNIT/ML IV SOLN
500.0000 [IU] | Freq: Once | INTRAVENOUS | Status: AC | PRN
  Administered 2016-04-13: 500 [IU]
  Filled 2016-04-13: qty 5

## 2016-04-13 NOTE — Patient Instructions (Signed)

## 2016-04-13 NOTE — Telephone Encounter (Signed)
Called in ativan to pharmacy 

## 2016-04-20 ENCOUNTER — Encounter: Payer: Medicaid Other | Admitting: Genetic Counselor

## 2016-04-20 ENCOUNTER — Other Ambulatory Visit: Payer: Medicaid Other

## 2016-04-20 ENCOUNTER — Encounter: Payer: Self-pay | Admitting: Genetic Counselor

## 2016-04-20 DIAGNOSIS — Z1379 Encounter for other screening for genetic and chromosomal anomalies: Secondary | ICD-10-CM | POA: Insufficient documentation

## 2016-04-21 ENCOUNTER — Ambulatory Visit (HOSPITAL_COMMUNITY)
Admission: RE | Admit: 2016-04-21 | Discharge: 2016-04-21 | Disposition: A | Discharge status: Home / Self Care | Payer: Medicaid Other | Source: Ambulatory Visit | Attending: Nurse Practitioner | Admitting: Nurse Practitioner

## 2016-04-21 DIAGNOSIS — R911 Solitary pulmonary nodule: Secondary | ICD-10-CM | POA: Insufficient documentation

## 2016-04-21 DIAGNOSIS — C787 Secondary malignant neoplasm of liver and intrahepatic bile duct: Secondary | ICD-10-CM | POA: Diagnosis present

## 2016-04-21 DIAGNOSIS — C189 Malignant neoplasm of colon, unspecified: Secondary | ICD-10-CM

## 2016-04-21 MED ORDER — IOPAMIDOL (ISOVUE-300) INJECTION 61%
30.0000 mL | Freq: Once | INTRAVENOUS | Status: AC | PRN
  Administered 2016-04-21: 30 mL via ORAL

## 2016-04-21 MED ORDER — IOPAMIDOL (ISOVUE-300) INJECTION 61%
100.0000 mL | Freq: Once | INTRAVENOUS | Status: AC | PRN
  Administered 2016-04-21: 100 mL via INTRAVENOUS

## 2016-04-24 ENCOUNTER — Other Ambulatory Visit: Payer: Self-pay | Admitting: Oncology

## 2016-04-25 ENCOUNTER — Ambulatory Visit (HOSPITAL_BASED_OUTPATIENT_CLINIC_OR_DEPARTMENT_OTHER): Payer: Medicaid Other

## 2016-04-25 ENCOUNTER — Ambulatory Visit (HOSPITAL_BASED_OUTPATIENT_CLINIC_OR_DEPARTMENT_OTHER): Payer: Medicaid Other | Admitting: Nurse Practitioner

## 2016-04-25 ENCOUNTER — Other Ambulatory Visit (HOSPITAL_BASED_OUTPATIENT_CLINIC_OR_DEPARTMENT_OTHER): Payer: Medicaid Other

## 2016-04-25 ENCOUNTER — Telehealth: Payer: Self-pay | Admitting: Hematology

## 2016-04-25 VITALS — BP 127/92 | HR 88 | Temp 97.7°F | Resp 17 | Ht 70.0 in | Wt 261.0 lb

## 2016-04-25 DIAGNOSIS — C187 Malignant neoplasm of sigmoid colon: Secondary | ICD-10-CM | POA: Diagnosis present

## 2016-04-25 DIAGNOSIS — K760 Fatty (change of) liver, not elsewhere classified: Secondary | ICD-10-CM

## 2016-04-25 DIAGNOSIS — Z5111 Encounter for antineoplastic chemotherapy: Secondary | ICD-10-CM | POA: Diagnosis not present

## 2016-04-25 DIAGNOSIS — F418 Other specified anxiety disorders: Secondary | ICD-10-CM

## 2016-04-25 DIAGNOSIS — Z95828 Presence of other vascular implants and grafts: Secondary | ICD-10-CM

## 2016-04-25 DIAGNOSIS — C787 Secondary malignant neoplasm of liver and intrahepatic bile duct: Secondary | ICD-10-CM

## 2016-04-25 DIAGNOSIS — I10 Essential (primary) hypertension: Secondary | ICD-10-CM | POA: Diagnosis not present

## 2016-04-25 DIAGNOSIS — C189 Malignant neoplasm of colon, unspecified: Secondary | ICD-10-CM

## 2016-04-25 DIAGNOSIS — D6959 Other secondary thrombocytopenia: Secondary | ICD-10-CM

## 2016-04-25 LAB — COMPREHENSIVE METABOLIC PANEL
ALT: 81 U/L — ABNORMAL HIGH (ref 0–55)
AST: 52 U/L — ABNORMAL HIGH (ref 5–34)
Albumin: 3.3 g/dL — ABNORMAL LOW (ref 3.5–5.0)
Alkaline Phosphatase: 96 U/L (ref 40–150)
Anion Gap: 14 mEq/L — ABNORMAL HIGH (ref 3–11)
BUN: 8.2 mg/dL (ref 7.0–26.0)
CO2: 21 mEq/L — ABNORMAL LOW (ref 22–29)
Calcium: 8.8 mg/dL (ref 8.4–10.4)
Chloride: 104 mEq/L (ref 98–109)
Creatinine: 1.1 mg/dL (ref 0.7–1.3)
EGFR: >90 mL/min/{1.73_m2} (ref >90)
Glucose: 159 mg/dl — ABNORMAL HIGH (ref 70–140)
Potassium: 3.2 mEq/L — ABNORMAL LOW (ref 3.5–5.1)
Sodium: 138 mEq/L (ref 136–145)
Total Bilirubin: 0.43 mg/dL (ref 0.20–1.20)
Total Protein: 7.3 g/dL (ref 6.4–8.3)

## 2016-04-25 LAB — CBC WITH DIFFERENTIAL/PLATELET
BASO%: 0 % (ref 0.0–2.0)
Basophils Absolute: 0 10*3/uL (ref 0.0–0.1)
EOS%: 4.3 % (ref 0.0–7.0)
Eosinophils Absolute: 0.3 10*3/uL (ref 0.0–0.5)
HCT: 37.4 % — ABNORMAL LOW (ref 38.4–49.9)
HGB: 11.6 g/dL — ABNORMAL LOW (ref 13.0–17.1)
LYMPH%: 39.7 % (ref 14.0–49.0)
MCH: 25.4 pg — ABNORMAL LOW (ref 27.2–33.4)
MCHC: 31 g/dL — ABNORMAL LOW (ref 32.0–36.0)
MCV: 81.8 fL (ref 79.3–98.0)
MONO#: 0.8 10*3/uL (ref 0.1–0.9)
MONO%: 13.1 % (ref 0.0–14.0)
NEUT#: 2.7 10*3/uL (ref 1.5–6.5)
NEUT%: 42.9 % (ref 39.0–75.0)
Platelets: 92 10*3/uL — ABNORMAL LOW (ref 140–400)
RBC: 4.57 10*6/uL (ref 4.20–5.82)
RDW: 18.8 % — ABNORMAL HIGH (ref 11.0–14.6)
WBC: 6.3 10*3/uL (ref 4.0–10.3)
lymph#: 2.5 10*3/uL (ref 0.9–3.3)

## 2016-04-25 MED ORDER — SODIUM CHLORIDE 0.9 % IV SOLN
2400.0000 mg/m2 | INTRAVENOUS | Status: DC
  Administered 2016-04-25: 5650 mg via INTRAVENOUS
  Filled 2016-04-25: qty 113

## 2016-04-25 MED ORDER — PALONOSETRON HCL INJECTION 0.25 MG/5ML
INTRAVENOUS | Status: AC
  Filled 2016-04-25: qty 5

## 2016-04-25 MED ORDER — SODIUM CHLORIDE 0.9% FLUSH
10.0000 mL | INTRAVENOUS | Status: DC | PRN
  Filled 2016-04-25: qty 10

## 2016-04-25 MED ORDER — SODIUM CHLORIDE 0.9% FLUSH
10.0000 mL | INTRAVENOUS | Status: DC | PRN
  Administered 2016-04-25: 10 mL via INTRAVENOUS
  Filled 2016-04-25: qty 10

## 2016-04-25 MED ORDER — PALONOSETRON HCL INJECTION 0.25 MG/5ML
0.2500 mg | Freq: Once | INTRAVENOUS | Status: AC
  Administered 2016-04-25: 0.25 mg via INTRAVENOUS

## 2016-04-25 MED ORDER — LEUCOVORIN CALCIUM INJECTION 350 MG
400.0000 mg/m2 | Freq: Once | INTRAVENOUS | Status: AC
  Administered 2016-04-25: 940 mg via INTRAVENOUS
  Filled 2016-04-25: qty 47

## 2016-04-25 MED ORDER — SODIUM CHLORIDE 0.9 % IV SOLN
Freq: Once | INTRAVENOUS | Status: AC
  Administered 2016-04-25: 11:00:00 via INTRAVENOUS
  Filled 2016-04-25: qty 5

## 2016-04-25 MED ORDER — POTASSIUM CHLORIDE CRYS ER 20 MEQ PO TBCR: 20.0000 meq | EXTENDED_RELEASE_TABLET | Freq: Every day | ORAL | 0 refills | Status: DC

## 2016-04-25 MED ORDER — HEPARIN SOD (PORK) LOCK FLUSH 100 UNIT/ML IV SOLN
500.0000 [IU] | Freq: Once | INTRAVENOUS | Status: DC | PRN
  Filled 2016-04-25: qty 5

## 2016-04-25 MED ORDER — FLUOROURACIL CHEMO INJECTION 2.5 GM/50ML
400.0000 mg/m2 | Freq: Once | INTRAVENOUS | Status: AC
  Administered 2016-04-25: 950 mg via INTRAVENOUS
  Filled 2016-04-25: qty 19

## 2016-04-25 MED ORDER — DEXTROSE 5 % IV SOLN
85.0000 mg/m2 | Freq: Once | INTRAVENOUS | Status: AC
  Administered 2016-04-25: 200 mg via INTRAVENOUS
  Filled 2016-04-25: qty 40

## 2016-04-25 MED ORDER — DEXTROSE 5 % IV SOLN
Freq: Once | INTRAVENOUS | Status: AC
  Administered 2016-04-25: 10:00:00 via INTRAVENOUS

## 2016-04-25 NOTE — Progress Notes (Addendum)
[facility] OFFICE PROGRESS NOTE   Diagnosis:  Colon cancer  INTERVAL HISTORY:   [name] returns as scheduled. He completed cycle 6 FOLFOX 04/11/2016. Oxaliplatin was held due to thrombocytopenia. He denies nausea/vomiting. No mouth sores. No diarrhea. No numbness or tingling in his hands or feet. He reports a good appetite. He denies pain. He continues to note a "sore throat" intermittently. No bleeding.  Objective:  Vital signs in last 24 hours:  Blood pressure (!) 127/92, pulse 88, temperature 97.7 F (36.5 C), temperature source Oral, resp. rate 17, height 5' 10" (1.778 m), weight 261 lb (118.4 kg), SpO2 100 %.    HEENT: No thrush or ulcers. Resp: Lungs clear bilaterally. Cardio: Regular rate and rhythm. GI: Abdomen soft and nontender. No hepatomegaly. Vascular: No leg edema. Neuro: Vibratory sense intact over the fingertips per tuning fork exam.  Skin: No rash. Port-A-Cath without erythema.    Lab Results:  Lab Results  Component Value Date   WBC 6.3 04/25/2016   HGB 11.6 (L) 04/25/2016   HCT 37.4 (L) 04/25/2016   MCV 81.8 04/25/2016   PLT 92 (L) 04/25/2016   NEUTROABS 2.7 04/25/2016    Imaging:  No results found.  Medications: I have reviewed the patient's current medications.  Assessment/Plan: 1. 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 CTschest, abdomen, and pelvis on 12/11/2015-indeterminate tiny pulmonary nodules, multiple liver metastases, small nodes in the sigmoid mesocolon ? Laparoscopic sigmoid colectomy, liver biopsy, Port-A-Cath placement 01/14/2016 ? Pathology sigmoid colon resection-colonic adenocarcinoma, 5.3 cm extending into pericolonic connective tissue, positive lymph-vascular invasion, positive perineural invasion, negative margins, metastatic carcinoma in9 of 28 lymph nodes ? Pathology liver biopsy-metastatic colorectal  adenocarcinoma ? MSI stable; mismatch repair protein normal ? APC alteration, K RAS wild-type, no BRAF mutation ? Cycle 1 FOLFOX 02/02/2016 ? Cycle 2 FOLFOX 02/15/2016 ? Cycle 3 FOLFOX 02/29/2016 ? Cycle 4 FOLFOX 03/14/2016 ? Cycle 5 FOLFOX 03/28/2016 ? Cycle 6 FOLFOX 04/11/2016 (oxaliplatin held secondary to thrombocytopenia)  ? 04/21/2016 restaging CTs-difficulty evaluating liver lesions due to hepatic steatosis. Stable right adrenal nodule. No adenopathy or local recurrence near the rectosigmoid anastomotic site. ? Cycle 7 FOLFOX 04/25/2016  2. Rectal bleeding and constipation secondary to #1  3. History of peripheral vascular disease, status post left lower extremity vascular bypass surgery in April 2017  4. History of nephrolithiasis  5. History of Graves' disease treated with radioactive iodine  6. Anxiety/depression  7. Hypertension  8. Hospitalization 01/19/2016 with wound dehiscence status post secondary suture closure of abdominal wall  9.   Thrombocytopenia secondary to chemotherapy-oxaliplatin held with cycle 6 FOLFOX    Disposition: Jeff [name] appears stable. He has completed 6 cycles of FOLFOX. Recent restaging CT evaluation showed hepatic steatosis making it difficult to evaluate the liver lesions. However, the CEA is markedly improved. Plan to proceed with cycle 7 FOLFOX today as scheduled. We are referring him for an MRI of the liver to evaluate the lesions.  He has persistent thrombocytopenia, improved as compared to 2 weeks ago. Oxaliplatin will be resumed with today's treatment. He understands to contact the office with any bleeding.  He will return for a follow-up visit and cycle 8 FOLFOX in 2 weeks. He will contact the office in the interim as outlined above or with any other problems.  Patient seen with Dr. Benay Spice.    Ned Card ANP/GNP-BC   04/25/2016  9:00 AM This was a shared visit with   . The restaging CTs  revealed no evidence of progressive disease. The liver lesions are hard to evaluate with progressive hepatic steatosis. He will be referred for an MRI of the liver. The plan is to continue FOLFOX chemotherapy. Jeff [name] will return for an office visit in 2 weeks.  Brad Sherrill, M.D.       

## 2016-04-25 NOTE — Patient Instructions (Signed)
[name] Discharge Instructions for Patients Receiving Chemotherapy  Today you received the following chemotherapy agents Oxaliplatin, Leucovorin, 5FU Push and Pump  To help prevent nausea and vomiting after your treatment, we encourage you to take your nausea medication as directed.   If you develop nausea and vomiting that is not controlled by your nausea medication, call the clinic.   BELOW ARE SYMPTOMS THAT SHOULD BE REPORTED IMMEDIATELY:  *FEVER GREATER THAN 100.5 F  *CHILLS WITH OR WITHOUT FEVER  NAUSEA AND VOMITING THAT IS NOT CONTROLLED WITH YOUR NAUSEA MEDICATION  *UNUSUAL SHORTNESS OF BREATH  *UNUSUAL BRUISING OR BLEEDING  TENDERNESS IN MOUTH AND THROAT WITH OR WITHOUT PRESENCE OF ULCERS  *URINARY PROBLEMS  *BOWEL PROBLEMS  UNUSUAL RASH Items with * indicate a potential emergency and should be followed up as soon as possible.  Feel free to call the clinic you have any questions or concerns. The clinic phone number is [phone].  Please show the [place] at check-in to the Emergency Department and triage nurse.

## 2016-04-25 NOTE — Telephone Encounter (Signed)
Message sent to chemo scheduler to be added. Appointments scheduled per 04/25/16 los. A copy of the appointment schedule was given to the patient, per 04/25/16 los.

## 2016-04-25 NOTE — Progress Notes (Signed)
Per Lattie Haw NP ok to treat with platelets 04/25/2016.  Patient to start potassium supplement.

## 2016-04-26 ENCOUNTER — Telehealth: Payer: Self-pay | Admitting: *Deleted

## 2016-04-26 NOTE — Telephone Encounter (Signed)
Per LOS I have scheduled appts and notfied the scheduler

## 2016-04-27 ENCOUNTER — Ambulatory Visit (HOSPITAL_BASED_OUTPATIENT_CLINIC_OR_DEPARTMENT_OTHER): Payer: Medicaid Other

## 2016-04-27 VITALS — BP 118/73 | HR 97 | Temp 97.9°F | Resp 16

## 2016-04-27 DIAGNOSIS — C787 Secondary malignant neoplasm of liver and intrahepatic bile duct: Secondary | ICD-10-CM | POA: Diagnosis not present

## 2016-04-27 DIAGNOSIS — Z452 Encounter for adjustment and management of vascular access device: Secondary | ICD-10-CM

## 2016-04-27 DIAGNOSIS — C187 Malignant neoplasm of sigmoid colon: Secondary | ICD-10-CM | POA: Diagnosis not present

## 2016-04-27 MED ORDER — HEPARIN SOD (PORK) LOCK FLUSH 100 UNIT/ML IV SOLN
500.0000 [IU] | Freq: Once | INTRAVENOUS | Status: AC | PRN
  Administered 2016-04-27: 500 [IU]
  Filled 2016-04-27: qty 5

## 2016-04-27 MED ORDER — SODIUM CHLORIDE 0.9% FLUSH
10.0000 mL | INTRAVENOUS | Status: DC | PRN
  Administered 2016-04-27: 10 mL
  Filled 2016-04-27: qty 10

## 2016-04-28 ENCOUNTER — Telehealth: Payer: Self-pay | Admitting: *Deleted

## 2016-04-28 NOTE — Telephone Encounter (Signed)
Wife called back to report they can't accept this appointment for 12/15 due to her needing to work. Requested 12/19 or 12/20 when she is off work. Per radiology they have nothing on 12/19 or 12/20. Can do scan on 12/17 (Sunday) at 0845/0900. Called wife back this this option with request to return call to confirm. If this is not agreeable, provided the phone # of central scheduling for her to call to coordinate a time that will work for them.

## 2016-04-28 NOTE — Telephone Encounter (Signed)
Oncology Nurse Navigator Documentation  Oncology Nurse Navigator Flowsheets 04/28/2016  Navigator Location [facility]  Referral date to RadOnc/MedOnc -  Navigator Encounter Type Telephone  Telephone Outgoing Call;Appt Confirmation/Clarification  Abnormal Finding Date -  Confirmed Diagnosis Date -  Surgery Date -  Treatment Initiated Date -  Patient Visit Type -  Treatment Phase -  Barriers/Navigation Needs Coordination of Care--MRI by 12/18  Education -  Interventions Coordination of Care--scheduled for 12/15 at 0645/0700 at [facility]  Referrals -  Coordination of Care Radiology--left Urgent VM at mobile # with appointment for MRI-date/time and location. NPO 4 hours prior. Requested return call by 6 pm to confirm or nurse will need to cancel it.  Support Groups/Services -  Acuity Level 2  Time Spent with Patient 15

## 2016-04-29 ENCOUNTER — Ambulatory Visit (HOSPITAL_COMMUNITY): Payer: Medicaid Other

## 2016-04-29 NOTE — Telephone Encounter (Signed)
Wife left VM that Sunday will be OK for his MRI. Will present case in GI Conference on 12/27 per Dr. Benay Spice

## 2016-05-01 ENCOUNTER — Ambulatory Visit (HOSPITAL_COMMUNITY)
Admission: RE | Admit: 2016-05-01 | Discharge: 2016-05-01 | Disposition: A | Discharge status: Home / Self Care | Payer: Medicaid Other | Source: Ambulatory Visit | Attending: Nurse Practitioner | Admitting: Nurse Practitioner

## 2016-05-01 DIAGNOSIS — D3501 Benign neoplasm of right adrenal gland: Secondary | ICD-10-CM | POA: Insufficient documentation

## 2016-05-01 DIAGNOSIS — C787 Secondary malignant neoplasm of liver and intrahepatic bile duct: Secondary | ICD-10-CM

## 2016-05-01 DIAGNOSIS — K760 Fatty (change of) liver, not elsewhere classified: Secondary | ICD-10-CM | POA: Diagnosis not present

## 2016-05-01 DIAGNOSIS — C189 Malignant neoplasm of colon, unspecified: Secondary | ICD-10-CM | POA: Diagnosis present

## 2016-05-01 MED ORDER — GADOBENATE DIMEGLUMINE 529 MG/ML IV SOLN
20.0000 mL | Freq: Once | INTRAVENOUS | Status: AC | PRN
  Administered 2016-05-01: 20 mL via INTRAVENOUS

## 2016-05-03 ENCOUNTER — Telehealth: Payer: Self-pay

## 2016-05-03 NOTE — Telephone Encounter (Signed)
Left message to inform him of MRI results. Informed pt to call back if he has any questions or concerns.

## 2016-05-03 NOTE — Telephone Encounter (Signed)
-----   Message from Owens Shark, NP sent at 05/03/2016  8:53 AM EST ----- Please call patient  On the MRI liver lesions appear improved compared to pre-treatment  CT

## 2016-05-09 ENCOUNTER — Other Ambulatory Visit: Payer: Self-pay | Admitting: Oncology

## 2016-05-10 ENCOUNTER — Telehealth: Payer: Self-pay | Admitting: Oncology

## 2016-05-10 ENCOUNTER — Ambulatory Visit (HOSPITAL_BASED_OUTPATIENT_CLINIC_OR_DEPARTMENT_OTHER): Payer: Medicaid Other | Admitting: Oncology

## 2016-05-10 ENCOUNTER — Ambulatory Visit: Payer: Medicaid Other

## 2016-05-10 ENCOUNTER — Ambulatory Visit (HOSPITAL_BASED_OUTPATIENT_CLINIC_OR_DEPARTMENT_OTHER): Payer: Medicaid Other

## 2016-05-10 ENCOUNTER — Other Ambulatory Visit: Payer: Medicaid Other

## 2016-05-10 VITALS — BP 127/72 | HR 93 | Temp 98.0°F | Resp 18 | Ht 70.0 in | Wt 259.9 lb

## 2016-05-10 DIAGNOSIS — C187 Malignant neoplasm of sigmoid colon: Secondary | ICD-10-CM

## 2016-05-10 DIAGNOSIS — K5900 Constipation, unspecified: Secondary | ICD-10-CM | POA: Diagnosis not present

## 2016-05-10 DIAGNOSIS — F419 Anxiety disorder, unspecified: Secondary | ICD-10-CM | POA: Diagnosis not present

## 2016-05-10 DIAGNOSIS — D6959 Other secondary thrombocytopenia: Secondary | ICD-10-CM

## 2016-05-10 DIAGNOSIS — Z95828 Presence of other vascular implants and grafts: Secondary | ICD-10-CM

## 2016-05-10 DIAGNOSIS — Z5111 Encounter for antineoplastic chemotherapy: Secondary | ICD-10-CM | POA: Diagnosis not present

## 2016-05-10 DIAGNOSIS — C787 Secondary malignant neoplasm of liver and intrahepatic bile duct: Secondary | ICD-10-CM

## 2016-05-10 DIAGNOSIS — F329 Major depressive disorder, single episode, unspecified: Secondary | ICD-10-CM | POA: Diagnosis not present

## 2016-05-10 LAB — CBC WITH DIFFERENTIAL/PLATELET
BASO%: 0 % (ref 0.0–2.0)
Basophils Absolute: 0 10*3/uL (ref 0.0–0.1)
EOS%: 4.8 % (ref 0.0–7.0)
Eosinophils Absolute: 0.3 10*3/uL (ref 0.0–0.5)
HCT: 36.5 % — ABNORMAL LOW (ref 38.4–49.9)
HGB: 11.7 g/dL — ABNORMAL LOW (ref 13.0–17.1)
LYMPH%: 32 % (ref 14.0–49.0)
MCH: 26.1 pg — ABNORMAL LOW (ref 27.2–33.4)
MCHC: 32.1 g/dL (ref 32.0–36.0)
MCV: 81.3 fL (ref 79.3–98.0)
MONO#: 0.8 10*3/uL (ref 0.1–0.9)
MONO%: 13.8 % (ref 0.0–14.0)
NEUT#: 2.7 10*3/uL (ref 1.5–6.5)
NEUT%: 49.4 % (ref 39.0–75.0)
Platelets: 87 10*3/uL — ABNORMAL LOW (ref 140–400)
RBC: 4.49 10*6/uL (ref 4.20–5.82)
RDW: 18.2 % — ABNORMAL HIGH (ref 11.0–14.6)
WBC: 5.4 10*3/uL (ref 4.0–10.3)
lymph#: 1.7 10*3/uL (ref 0.9–3.3)
nRBC: 0 % (ref 0–0)

## 2016-05-10 LAB — COMPREHENSIVE METABOLIC PANEL
ALT: 82 U/L — ABNORMAL HIGH (ref 0–55)
AST: 56 U/L — ABNORMAL HIGH (ref 5–34)
Albumin: 3.6 g/dL (ref 3.5–5.0)
Alkaline Phosphatase: 112 U/L (ref 40–150)
Anion Gap: 12 mEq/L — ABNORMAL HIGH (ref 3–11)
BUN: 6.2 mg/dL — ABNORMAL LOW (ref 7.0–26.0)
CO2: 24 mEq/L (ref 22–29)
Calcium: 9 mg/dL (ref 8.4–10.4)
Chloride: 102 mEq/L (ref 98–109)
Creatinine: 1.1 mg/dL (ref 0.7–1.3)
EGFR: 88 mL/min/{1.73_m2} — ABNORMAL LOW (ref >90)
Glucose: 178 mg/dl — ABNORMAL HIGH (ref 70–140)
Potassium: 3.4 mEq/L — ABNORMAL LOW (ref 3.5–5.1)
Sodium: 138 mEq/L (ref 136–145)
Total Bilirubin: 0.58 mg/dL (ref 0.20–1.20)
Total Protein: 7.4 g/dL (ref 6.4–8.3)

## 2016-05-10 LAB — CEA (IN HOUSE-CHCC): CEA (CHCC-In House): 2.11 ng/mL (ref 0.00–5.00)

## 2016-05-10 MED ORDER — DEXTROSE 5 % IV SOLN
85.0000 mg/m2 | Freq: Once | INTRAVENOUS | Status: AC
  Administered 2016-05-10: 200 mg via INTRAVENOUS
  Filled 2016-05-10: qty 40

## 2016-05-10 MED ORDER — LEUCOVORIN CALCIUM INJECTION 350 MG
400.0000 mg/m2 | Freq: Once | INTRAMUSCULAR | Status: AC
  Administered 2016-05-10: 940 mg via INTRAVENOUS
  Filled 2016-05-10: qty 47

## 2016-05-10 MED ORDER — SODIUM CHLORIDE 0.9 % IV SOLN
2400.0000 mg/m2 | INTRAVENOUS | Status: DC
  Administered 2016-05-10: 5650 mg via INTRAVENOUS
  Filled 2016-05-10: qty 113

## 2016-05-10 MED ORDER — PALONOSETRON HCL INJECTION 0.25 MG/5ML
INTRAVENOUS | Status: AC
  Filled 2016-05-10: qty 5

## 2016-05-10 MED ORDER — SODIUM CHLORIDE 0.9% FLUSH
10.0000 mL | INTRAVENOUS | Status: DC | PRN
  Administered 2016-05-10: 10 mL via INTRAVENOUS
  Filled 2016-05-10: qty 10

## 2016-05-10 MED ORDER — PALONOSETRON HCL INJECTION 0.25 MG/5ML
0.2500 mg | Freq: Once | INTRAVENOUS | Status: AC
Start: 2016-05-10 — End: 2016-05-10
  Administered 2016-05-10: 0.25 mg via INTRAVENOUS

## 2016-05-10 MED ORDER — SODIUM CHLORIDE 0.9 % IV SOLN
Freq: Once | INTRAVENOUS | Status: AC
  Administered 2016-05-10: 10:00:00 via INTRAVENOUS
  Filled 2016-05-10: qty 5

## 2016-05-10 MED ORDER — FLUOROURACIL CHEMO INJECTION 2.5 GM/50ML
400.0000 mg/m2 | Freq: Once | INTRAVENOUS | Status: AC
  Administered 2016-05-10: 950 mg via INTRAVENOUS
  Filled 2016-05-10: qty 19

## 2016-05-10 MED ORDER — DEXTROSE 5 % IV SOLN
Freq: Once | INTRAVENOUS | Status: AC
  Administered 2016-05-10: 11:00:00 via INTRAVENOUS

## 2016-05-10 NOTE — Patient Instructions (Signed)
[facility] Discharge Instructions for Patients Receiving Chemotherapy  Today you received the following chemotherapy agents FOLFOX  To help prevent nausea and vomiting after your treatment, we encourage you to take your nausea medication as needed   If you develop nausea and vomiting that is not controlled by your nausea medication, call the clinic.   BELOW ARE SYMPTOMS THAT SHOULD BE REPORTED IMMEDIATELY:  *FEVER GREATER THAN 100.5 F  *CHILLS WITH OR WITHOUT FEVER  NAUSEA AND VOMITING THAT IS NOT CONTROLLED WITH YOUR NAUSEA MEDICATION  *UNUSUAL SHORTNESS OF BREATH  *UNUSUAL BRUISING OR BLEEDING  TENDERNESS IN MOUTH AND THROAT WITH OR WITHOUT PRESENCE OF ULCERS  *URINARY PROBLEMS  *BOWEL PROBLEMS  UNUSUAL RASH Items with * indicate a potential emergency and should be followed up as soon as possible.  Feel free to call the clinic you have any questions or concerns. The clinic phone number is [phone].  Please show the CHEMO ALERT CARD at check-in to the Emergency Department and triage nurse.   

## 2016-05-10 NOTE — Telephone Encounter (Signed)
No changes in treatment plan, continue with follow up and treatment as previously scheduled, per 05/10/16 los. Copy of appointment schedule and AVS report, was given to patient, per 05/10/16 los.

## 2016-05-10 NOTE — Progress Notes (Signed)
OK to treat with platelets 87 per Dr. Benay Spice.

## 2016-05-10 NOTE — Progress Notes (Signed)
  [facility] OFFICE PROGRESS NOTE   Diagnosis: Colon cancer  INTERVAL HISTORY:   Jeff [name] returns as scheduled. He completed another treatment with FOLFOX on 04/25/2016. No nausea/vomiting or diarrhea. He feels well. He reports prolonged cold sensitivity following chemotherapy. No peripheral numbness today.   Objective:  Vital signs in last 24 hours:  Blood pressure 127/72, pulse 93, temperature 98 F (36.7 C), temperature source Oral, resp. rate 18, height _0  (1.778 m), weight 259 lb 14.4 oz (117.9 kg), SpO2 100 %.    HEENT: No thrush or ulcers Resp: Lungs clear bilaterally Cardio: Regular rate and rhythm GI: No hepatomegaly, nontender Vascular: No leg edema Neuro: Very mild loss in vibratory sense at the right greater than left fingertips     Portacath/PICC-without erythema  Lab Results:  Lab Results  Component Value Date   WBC 5.4 05/10/2016   HGB 11.7 (L) 05/10/2016   HCT 36.5 (L) 05/10/2016   MCV 81.3 05/10/2016   PLT 87 (L) 05/10/2016   NEUTROABS 2.7 05/10/2016     Medications: I have reviewed the patient's current medications.  Assessment/Plan: 1. 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 CTschest, abdomen, and pelvis on 12/11/2015-indeterminate tiny pulmonary nodules, multiple liver metastases, small nodes in the sigmoid mesocolon ? Laparoscopic sigmoid colectomy, liver biopsy, Port-A-Cath placement 01/14/2016 ? Pathology sigmoid colon resection-colonic adenocarcinoma, 5.3 cm extending into pericolonic connective tissue, positive lymph-vascular invasion, positive perineural invasion, negative margins, metastatic carcinoma in9 of 28 lymph nodes ? Pathology liver biopsy-metastatic colorectal adenocarcinoma ? MSI stable; mismatch repair protein normal ? APC alteration, K RAS wild-type, no BRAF mutation ? Cycle 1 FOLFOX 02/02/2016 ? Cycle 2 FOLFOX  02/15/2016 ? Cycle 3 FOLFOX 02/29/2016 ? Cycle 4 FOLFOX 03/14/2016 ? Cycle 5 FOLFOX 03/28/2016 ? Cycle 6 FOLFOX 04/11/2016 (oxaliplatin held secondary to thrombocytopenia)  ? 04/21/2016 restaging CTs-difficulty evaluating liver lesions due to hepatic steatosis. Stable right adrenal nodule. No adenopathy or local recurrence near the rectosigmoid anastomotic site. ? Cycle 7 FOLFOX 04/25/2016 ? MRI liver 05/02/2016-partial improvement in hepatic metastases ? Cycle 8 FOLFOX 05/10/2016  2. Rectal bleeding and constipation secondary to #1  3. History of peripheral vascular disease, status post left lower extremity vascular bypass surgery in April 2017  4. History of nephrolithiasis  5. History of Graves' disease treated with radioactive iodine  6. Anxiety/depression  7. Hypertension  8. Hospitalization 01/19/2016 with wound dehiscence status post secondary suture closure of abdominal wall  9. Thrombocytopenia secondary to chemotherapy-oxaliplatin held with cycle 6 FOLFOX   Disposition:  Jeff [name] appears unchanged. He will complete cycle 8 FOLFOX today. The plan is to complete 9 or 10 cycles of FOLFOX prior to beginning maintenance therapy. We will follow-up on the CEA from today.  Jeff [name] will return for an office visit and chemotherapy in 2 weeks.  Betsy Coder, MD  05/10/2016  9:24 AM

## 2016-05-12 ENCOUNTER — Ambulatory Visit (HOSPITAL_BASED_OUTPATIENT_CLINIC_OR_DEPARTMENT_OTHER): Payer: Medicaid Other

## 2016-05-12 DIAGNOSIS — C787 Secondary malignant neoplasm of liver and intrahepatic bile duct: Secondary | ICD-10-CM

## 2016-05-12 DIAGNOSIS — C187 Malignant neoplasm of sigmoid colon: Secondary | ICD-10-CM

## 2016-05-12 DIAGNOSIS — Z452 Encounter for adjustment and management of vascular access device: Secondary | ICD-10-CM | POA: Diagnosis present

## 2016-05-12 MED ORDER — HEPARIN SOD (PORK) LOCK FLUSH 100 UNIT/ML IV SOLN
500.0000 [IU] | Freq: Once | INTRAVENOUS | Status: AC | PRN
  Administered 2016-05-12: 500 [IU]
  Filled 2016-05-12: qty 5

## 2016-05-12 MED ORDER — SODIUM CHLORIDE 0.9% FLUSH
10.0000 mL | INTRAVENOUS | Status: DC | PRN
  Administered 2016-05-12: 10 mL
  Filled 2016-05-12: qty 10

## 2016-05-17 ENCOUNTER — Other Ambulatory Visit: Payer: Self-pay | Admitting: Nurse Practitioner

## 2016-05-17 DIAGNOSIS — C189 Malignant neoplasm of colon, unspecified: Secondary | ICD-10-CM

## 2016-05-17 DIAGNOSIS — C787 Secondary malignant neoplasm of liver and intrahepatic bile duct: Secondary | ICD-10-CM

## 2016-05-19 ENCOUNTER — Other Ambulatory Visit: Payer: Self-pay | Admitting: *Deleted

## 2016-05-19 ENCOUNTER — Other Ambulatory Visit: Payer: Self-pay | Admitting: Oncology

## 2016-05-19 DIAGNOSIS — C787 Secondary malignant neoplasm of liver and intrahepatic bile duct: Secondary | ICD-10-CM

## 2016-05-19 DIAGNOSIS — C189 Malignant neoplasm of colon, unspecified: Secondary | ICD-10-CM

## 2016-05-19 MED ORDER — POTASSIUM CHLORIDE CRYS ER 20 MEQ PO TBCR: 20.0000 meq | EXTENDED_RELEASE_TABLET | Freq: Every day | ORAL | 0 refills | Status: DC

## 2016-05-22 ENCOUNTER — Other Ambulatory Visit: Payer: Self-pay | Admitting: Oncology

## 2016-05-23 ENCOUNTER — Other Ambulatory Visit (HOSPITAL_BASED_OUTPATIENT_CLINIC_OR_DEPARTMENT_OTHER): Payer: Medicaid Other

## 2016-05-23 ENCOUNTER — Ambulatory Visit (HOSPITAL_BASED_OUTPATIENT_CLINIC_OR_DEPARTMENT_OTHER): Payer: Medicaid Other

## 2016-05-23 ENCOUNTER — Ambulatory Visit (HOSPITAL_BASED_OUTPATIENT_CLINIC_OR_DEPARTMENT_OTHER): Payer: Medicaid Other | Admitting: Nurse Practitioner

## 2016-05-23 ENCOUNTER — Telehealth: Payer: Self-pay | Admitting: Oncology

## 2016-05-23 VITALS — BP 126/89 | HR 93 | Temp 97.3°F | Resp 19 | Wt 259.8 lb

## 2016-05-23 DIAGNOSIS — D6959 Other secondary thrombocytopenia: Secondary | ICD-10-CM | POA: Diagnosis not present

## 2016-05-23 DIAGNOSIS — Z5111 Encounter for antineoplastic chemotherapy: Secondary | ICD-10-CM

## 2016-05-23 DIAGNOSIS — F418 Other specified anxiety disorders: Secondary | ICD-10-CM | POA: Diagnosis not present

## 2016-05-23 DIAGNOSIS — I10 Essential (primary) hypertension: Secondary | ICD-10-CM | POA: Diagnosis not present

## 2016-05-23 DIAGNOSIS — C187 Malignant neoplasm of sigmoid colon: Secondary | ICD-10-CM

## 2016-05-23 DIAGNOSIS — C189 Malignant neoplasm of colon, unspecified: Secondary | ICD-10-CM

## 2016-05-23 DIAGNOSIS — Z95828 Presence of other vascular implants and grafts: Secondary | ICD-10-CM

## 2016-05-23 DIAGNOSIS — C787 Secondary malignant neoplasm of liver and intrahepatic bile duct: Secondary | ICD-10-CM

## 2016-05-23 LAB — CBC WITH DIFFERENTIAL/PLATELET
BASO%: 0 % (ref 0.0–2.0)
Basophils Absolute: 0 10*3/uL (ref 0.0–0.1)
EOS%: 3.6 % (ref 0.0–7.0)
Eosinophils Absolute: 0.2 10*3/uL (ref 0.0–0.5)
HCT: 38.7 % (ref 38.4–49.9)
HGB: 12.1 g/dL — ABNORMAL LOW (ref 13.0–17.1)
LYMPH%: 42.1 % (ref 14.0–49.0)
MCH: 25.8 pg — ABNORMAL LOW (ref 27.2–33.4)
MCHC: 31.3 g/dL — ABNORMAL LOW (ref 32.0–36.0)
MCV: 82.5 fL (ref 79.3–98.0)
MONO#: 0.5 10*3/uL (ref 0.1–0.9)
MONO%: 8.5 % (ref 0.0–14.0)
NEUT#: 2.4 10*3/uL (ref 1.5–6.5)
NEUT%: 45.8 % (ref 39.0–75.0)
Platelets: 77 10*3/uL — ABNORMAL LOW (ref 140–400)
RBC: 4.69 10*6/uL (ref 4.20–5.82)
RDW: 18.5 % — ABNORMAL HIGH (ref 11.0–14.6)
WBC: 5.3 10*3/uL (ref 4.0–10.3)
lymph#: 2.2 10*3/uL (ref 0.9–3.3)

## 2016-05-23 LAB — COMPREHENSIVE METABOLIC PANEL
ALT: 93 U/L — ABNORMAL HIGH (ref 0–55)
AST: 59 U/L — ABNORMAL HIGH (ref 5–34)
Albumin: 3.7 g/dL (ref 3.5–5.0)
Alkaline Phosphatase: 130 U/L (ref 40–150)
Anion Gap: 11 mEq/L (ref 3–11)
BUN: 7.9 mg/dL (ref 7.0–26.0)
CO2: 22 mEq/L (ref 22–29)
Calcium: 9 mg/dL (ref 8.4–10.4)
Chloride: 104 mEq/L (ref 98–109)
Creatinine: 1.3 mg/dL (ref 0.7–1.3)
EGFR: 77 mL/min/{1.73_m2} — ABNORMAL LOW (ref >90)
Glucose: 236 mg/dl — ABNORMAL HIGH (ref 70–140)
Potassium: 3.8 mEq/L (ref 3.5–5.1)
Sodium: 137 mEq/L (ref 136–145)
Total Bilirubin: 0.47 mg/dL (ref 0.20–1.20)
Total Protein: 7.5 g/dL (ref 6.4–8.3)

## 2016-05-23 LAB — CEA (IN HOUSE-CHCC): CEA (CHCC-In House): 2.03 ng/mL (ref 0.00–5.00)

## 2016-05-23 MED ORDER — SODIUM CHLORIDE 0.9% FLUSH
10.0000 mL | INTRAVENOUS | Status: DC | PRN
  Administered 2016-05-23: 10 mL via INTRAVENOUS
  Filled 2016-05-23: qty 10

## 2016-05-23 MED ORDER — LORAZEPAM 0.5 MG PO TABS: ORAL_TABLET | ORAL | 1 refills | Status: DC

## 2016-05-23 MED ORDER — SODIUM CHLORIDE 0.9 % IV SOLN
2400.0000 mg/m2 | INTRAVENOUS | Status: DC
  Administered 2016-05-23: 5650 mg via INTRAVENOUS
  Filled 2016-05-23: qty 113

## 2016-05-23 MED ORDER — FLUOROURACIL CHEMO INJECTION 2.5 GM/50ML
400.0000 mg/m2 | Freq: Once | INTRAVENOUS | Status: AC
  Administered 2016-05-23: 950 mg via INTRAVENOUS
  Filled 2016-05-23: qty 19

## 2016-05-23 MED ORDER — DEXTROSE 5 % IV SOLN
Freq: Once | INTRAVENOUS | Status: AC
  Administered 2016-05-23: 11:00:00 via INTRAVENOUS

## 2016-05-23 MED ORDER — SODIUM CHLORIDE 0.9% FLUSH
10.0000 mL | INTRAVENOUS | Status: DC | PRN
  Filled 2016-05-23: qty 10

## 2016-05-23 MED ORDER — HEPARIN SOD (PORK) LOCK FLUSH 100 UNIT/ML IV SOLN
500.0000 [IU] | Freq: Once | INTRAVENOUS | Status: DC | PRN
  Filled 2016-05-23: qty 5

## 2016-05-23 MED ORDER — LEUCOVORIN CALCIUM INJECTION 350 MG
400.0000 mg/m2 | Freq: Once | INTRAVENOUS | Status: AC
  Administered 2016-05-23: 940 mg via INTRAVENOUS
  Filled 2016-05-23: qty 47

## 2016-05-23 NOTE — Patient Instructions (Signed)
[facility] Discharge Instructions for Patients Receiving Chemotherapy  Today you received the following chemotherapy agents leucovorin and 5FU.  To help prevent nausea and vomiting after your treatment, we encourage you to take your nausea medication as needed   If you develop nausea and vomiting that is not controlled by your nausea medication, call the clinic.   BELOW ARE SYMPTOMS THAT SHOULD BE REPORTED IMMEDIATELY:  *FEVER GREATER THAN 100.5 F  *CHILLS WITH OR WITHOUT FEVER  NAUSEA AND VOMITING THAT IS NOT CONTROLLED WITH YOUR NAUSEA MEDICATION  *UNUSUAL SHORTNESS OF BREATH  *UNUSUAL BRUISING OR BLEEDING  TENDERNESS IN MOUTH AND THROAT WITH OR WITHOUT PRESENCE OF ULCERS  *URINARY PROBLEMS  *BOWEL PROBLEMS  UNUSUAL RASH Items with * indicate a potential emergency and should be followed up as soon as possible.  Feel free to call the clinic you have any questions or concerns. The clinic phone number is [phone].  Please show the [place] at check-in to the Emergency Department and triage nurse.

## 2016-05-23 NOTE — Progress Notes (Signed)
  [place] OFFICE PROGRESS NOTE   Diagnosis:  Colon cancer  INTERVAL HISTORY:   Mr. Spieker returns as scheduled. He completed cycle 8 FOLFOX 05/10/2016. He denies nausea/vomiting. No mouth sores. He does note a "dry mouth". No diarrhea. He has persistent mild cold sensitivity especially in the feet.  Objective:  Vital signs in last 24 hours:  Blood pressure 126/89, pulse 93, temperature 97.3 F (36.3 C), temperature source Oral, resp. rate 19, weight 259 lb 12.8 oz (117.8 kg), SpO2 100 %.    HEENT: No thrush or ulcers. Resp: Lungs clear bilaterally. Cardio: Regular rate and rhythm. GI: Abdomen soft and nontender. No hepatomegaly. Vascular: No leg edema. Neuro: Vibratory sense intact over the fingertips per tuning fork exam.  Port-A-Cath without erythema.    Lab Results:  Lab Results  Component Value Date   WBC 5.3 05/23/2016   HGB 12.1 (L) 05/23/2016   HCT 38.7 05/23/2016   MCV 82.5 05/23/2016   PLT 77 (L) 05/23/2016   NEUTROABS 2.4 05/23/2016    Imaging:  No results found.  Medications: I have reviewed the patient's current medications.  Assessment/Plan: 1. 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 CTschest, abdomen, and pelvis on 12/11/2015-indeterminate tiny pulmonary nodules, multiple liver metastases, small nodes in the sigmoid mesocolon ? Laparoscopic sigmoid colectomy, liver biopsy, Port-A-Cath placement 01/14/2016 ? Pathology sigmoid colon resection-colonic adenocarcinoma, 5.3 cm extending into pericolonic connective tissue, positive lymph-vascular invasion, positive perineural invasion, negative margins, metastatic carcinoma in9 of 28 lymph nodes ? Pathology liver biopsy-metastatic colorectal adenocarcinoma ? MSI stable; mismatch repair protein normal ? APC alteration, K RAS wild-type, no BRAF mutation ? Cycle 1 FOLFOX 02/02/2016 ? Cycle 2 FOLFOX  02/15/2016 ? Cycle 3 FOLFOX 02/29/2016 ? Cycle 4 FOLFOX 03/14/2016 ? Cycle 5 FOLFOX 03/28/2016 ? Cycle 6 FOLFOX 04/11/2016 (oxaliplatin held secondary to thrombocytopenia)  ? 04/21/2016 restaging CTs-difficulty evaluating liver lesions due to hepatic steatosis. Stable right adrenal nodule. No adenopathy or local recurrence near the rectosigmoid anastomotic site. ? Cycle 7 FOLFOX 04/25/2016 ? MRI liver 05/02/2016-partial improvement in hepatic metastases ? Cycle 8 FOLFOX 05/10/2016 ? Cycle 9 FOLFOX 05/23/2016 (oxaliplatin held due to thrombocytopenia)  2. Rectal bleeding and constipation secondary to #1  3. History of peripheral vascular disease, status post left lower extremity vascular bypass surgery in April 2017  4. History of nephrolithiasis  5. History of Graves' disease treated with radioactive iodine  6. Anxiety/depression  7. Hypertension  8. Hospitalization 01/19/2016 with wound dehiscence status post secondary suture closure of abdominal wall  9. Thrombocytopeniasecondary to chemotherapy-oxaliplatin held with cycle 6 and cycle 9 FOLFOX    Disposition: Jeff [name] appears stable. He has completed 8 cycles of FOLFOX. Plan to proceed with cycle 9 today as scheduled. Oxaliplatin will be held due to thrombocytopenia. He understands to contact the office with any bleeding. He will return for a follow-up visit and cycle 10 in 2 weeks.  Plan reviewed with Dr. Benay Spice.    Ned Card ANP/GNP-BC   05/23/2016  10:32 AM

## 2016-05-23 NOTE — Telephone Encounter (Signed)
Gave relative avs report and appointments for January and February.  °

## 2016-05-23 NOTE — Progress Notes (Signed)
Ned Card, NP okay to tx pt today with plt 77. Oxaliplatin removed from today's tx.

## 2016-05-25 ENCOUNTER — Ambulatory Visit (HOSPITAL_BASED_OUTPATIENT_CLINIC_OR_DEPARTMENT_OTHER): Payer: Medicaid Other

## 2016-05-25 VITALS — BP 112/71 | HR 108 | Temp 98.4°F | Resp 20

## 2016-05-25 DIAGNOSIS — Z452 Encounter for adjustment and management of vascular access device: Secondary | ICD-10-CM

## 2016-05-25 DIAGNOSIS — C187 Malignant neoplasm of sigmoid colon: Secondary | ICD-10-CM

## 2016-05-25 DIAGNOSIS — C787 Secondary malignant neoplasm of liver and intrahepatic bile duct: Secondary | ICD-10-CM | POA: Diagnosis not present

## 2016-05-25 MED ORDER — SODIUM CHLORIDE 0.9% FLUSH
10.0000 mL | INTRAVENOUS | Status: DC | PRN
  Administered 2016-05-25: 10 mL
  Filled 2016-05-25: qty 10

## 2016-05-25 MED ORDER — HEPARIN SOD (PORK) LOCK FLUSH 100 UNIT/ML IV SOLN
500.0000 [IU] | Freq: Once | INTRAVENOUS | Status: AC | PRN
  Administered 2016-05-25: 500 [IU]
  Filled 2016-05-25: qty 5

## 2016-06-05 ENCOUNTER — Other Ambulatory Visit: Payer: Self-pay | Admitting: Oncology

## 2016-06-06 ENCOUNTER — Other Ambulatory Visit (HOSPITAL_BASED_OUTPATIENT_CLINIC_OR_DEPARTMENT_OTHER): Payer: Medicaid Other

## 2016-06-06 ENCOUNTER — Other Ambulatory Visit (HOSPITAL_COMMUNITY): Payer: Medicaid Other

## 2016-06-06 ENCOUNTER — Ambulatory Visit (HOSPITAL_BASED_OUTPATIENT_CLINIC_OR_DEPARTMENT_OTHER): Payer: Medicaid Other

## 2016-06-06 ENCOUNTER — Ambulatory Visit (HOSPITAL_BASED_OUTPATIENT_CLINIC_OR_DEPARTMENT_OTHER): Payer: Medicaid Other | Admitting: Nurse Practitioner

## 2016-06-06 ENCOUNTER — Encounter (HOSPITAL_COMMUNITY): Payer: Medicaid Other

## 2016-06-06 ENCOUNTER — Telehealth: Payer: Self-pay | Admitting: Nurse Practitioner

## 2016-06-06 ENCOUNTER — Ambulatory Visit: Payer: Medicaid Other | Admitting: Surgery

## 2016-06-06 ENCOUNTER — Ambulatory Visit: Payer: Medicaid Other

## 2016-06-06 VITALS — BP 127/80 | HR 88 | Temp 98.5°F | Resp 18 | Ht 70.0 in | Wt 258.1 lb

## 2016-06-06 DIAGNOSIS — C187 Malignant neoplasm of sigmoid colon: Secondary | ICD-10-CM

## 2016-06-06 DIAGNOSIS — F418 Other specified anxiety disorders: Secondary | ICD-10-CM

## 2016-06-06 DIAGNOSIS — Z5111 Encounter for antineoplastic chemotherapy: Secondary | ICD-10-CM

## 2016-06-06 DIAGNOSIS — I10 Essential (primary) hypertension: Secondary | ICD-10-CM | POA: Diagnosis not present

## 2016-06-06 DIAGNOSIS — C787 Secondary malignant neoplasm of liver and intrahepatic bile duct: Secondary | ICD-10-CM

## 2016-06-06 DIAGNOSIS — D6959 Other secondary thrombocytopenia: Secondary | ICD-10-CM | POA: Diagnosis not present

## 2016-06-06 DIAGNOSIS — C189 Malignant neoplasm of colon, unspecified: Secondary | ICD-10-CM

## 2016-06-06 LAB — COMPREHENSIVE METABOLIC PANEL
ALT: 107 U/L — ABNORMAL HIGH (ref 0–55)
AST: 70 U/L — ABNORMAL HIGH (ref 5–34)
Albumin: 3.5 g/dL (ref 3.5–5.0)
Alkaline Phosphatase: 111 U/L (ref 40–150)
Anion Gap: 15 mEq/L — ABNORMAL HIGH (ref 3–11)
BUN: 8.1 mg/dL (ref 7.0–26.0)
CO2: 22 mEq/L (ref 22–29)
Calcium: 9 mg/dL (ref 8.4–10.4)
Chloride: 100 mEq/L (ref 98–109)
Creatinine: 1.3 mg/dL (ref 0.7–1.3)
EGFR: 74 mL/min/{1.73_m2} — ABNORMAL LOW (ref >90)
Glucose: 286 mg/dl — ABNORMAL HIGH (ref 70–140)
Potassium: 3.8 mEq/L (ref 3.5–5.1)
Sodium: 137 mEq/L (ref 136–145)
Total Bilirubin: 0.51 mg/dL (ref 0.20–1.20)
Total Protein: 7.4 g/dL (ref 6.4–8.3)

## 2016-06-06 LAB — CBC WITH DIFFERENTIAL/PLATELET
BASO%: 0.4 % (ref 0.0–2.0)
Basophils Absolute: 0 10*3/uL (ref 0.0–0.1)
EOS%: 2.3 % (ref 0.0–7.0)
Eosinophils Absolute: 0.1 10*3/uL (ref 0.0–0.5)
HCT: 38.4 % (ref 38.4–49.9)
HGB: 12.2 g/dL — ABNORMAL LOW (ref 13.0–17.1)
LYMPH%: 32.9 % (ref 14.0–49.0)
MCH: 26.3 pg — ABNORMAL LOW (ref 27.2–33.4)
MCHC: 31.8 g/dL — ABNORMAL LOW (ref 32.0–36.0)
MCV: 82.7 fL (ref 79.3–98.0)
MONO#: 0.8 10*3/uL (ref 0.1–0.9)
MONO%: 12.1 % (ref 0.0–14.0)
NEUT#: 3.3 10*3/uL (ref 1.5–6.5)
NEUT%: 52.3 % (ref 39.0–75.0)
Platelets: 84 10*3/uL — ABNORMAL LOW (ref 140–400)
RBC: 4.65 10*6/uL (ref 4.20–5.82)
RDW: 20.8 % — ABNORMAL HIGH (ref 11.0–14.6)
WBC: 6.4 10*3/uL (ref 4.0–10.3)
lymph#: 2.1 10*3/uL (ref 0.9–3.3)

## 2016-06-06 MED ORDER — FLUOROURACIL CHEMO INJECTION 2.5 GM/50ML
400.0000 mg/m2 | Freq: Once | INTRAVENOUS | Status: AC
  Administered 2016-06-06: 950 mg via INTRAVENOUS
  Filled 2016-06-06: qty 19

## 2016-06-06 MED ORDER — OXALIPLATIN CHEMO INJECTION 100 MG/20ML
63.0000 mg/m2 | Freq: Once | INTRAVENOUS | Status: AC
  Administered 2016-06-06: 150 mg via INTRAVENOUS
  Filled 2016-06-06: qty 20

## 2016-06-06 MED ORDER — PALONOSETRON HCL INJECTION 0.25 MG/5ML
INTRAVENOUS | Status: AC
  Filled 2016-06-06: qty 5

## 2016-06-06 MED ORDER — SODIUM CHLORIDE 0.9 % IV SOLN
Freq: Once | INTRAVENOUS | Status: AC
  Administered 2016-06-06: 11:00:00 via INTRAVENOUS
  Filled 2016-06-06: qty 5

## 2016-06-06 MED ORDER — PALONOSETRON HCL INJECTION 0.25 MG/5ML
0.2500 mg | Freq: Once | INTRAVENOUS | Status: AC
  Administered 2016-06-06: 0.25 mg via INTRAVENOUS

## 2016-06-06 MED ORDER — DEXTROSE 5 % IV SOLN
Freq: Once | INTRAVENOUS | Status: AC
  Administered 2016-06-06: 10:00:00 via INTRAVENOUS

## 2016-06-06 MED ORDER — LEUCOVORIN CALCIUM INJECTION 350 MG
400.0000 mg/m2 | Freq: Once | INTRAVENOUS | Status: AC
  Administered 2016-06-06: 940 mg via INTRAVENOUS
  Filled 2016-06-06: qty 47

## 2016-06-06 MED ORDER — SODIUM CHLORIDE 0.9 % IV SOLN
2400.0000 mg/m2 | INTRAVENOUS | Status: DC
  Administered 2016-06-06: 5650 mg via INTRAVENOUS
  Filled 2016-06-06: qty 113

## 2016-06-06 NOTE — Progress Notes (Signed)
  [place] OFFICE PROGRESS NOTE   Diagnosis:  Colon cancer  INTERVAL HISTORY:   Jeff [name] returns as scheduled. He completed cycle 9 FOLFOX 05/23/2016. No nausea or vomiting related to the chemotherapy. No mouth sores. No diarrhea. He reports mild persistent cold sensitivity. Toes feel "cold". He noted blood on the toilet tissue following a bowel movement on one occasion. No other bleeding.  Objective:  Vital signs in last 24 hours:  Blood pressure 127/80, pulse 88, temperature 98.5 F (36.9 C), temperature source Oral, resp. rate 18, height '5\' 10"'$  (1.778 m), weight 258 lb 1.6 oz (117.1 kg), SpO2 100 %.    HEENT: No thrush or ulcers. Resp: Lungs clear bilaterally. Cardio: Regular rate and rhythm.  GI: Abdomen soft and nontender. No hepatomegaly. Vascular: No leg edema. Calves soft and nontender. Neuro: Vibratory sense intact over the fingertips per tuning fork exam.  Skin: Palms without erythema. Port-A-Cath without erythema.    Lab Results:  Lab Results  Component Value Date   WBC 6.4 06/06/2016   HGB 12.2 (L) 06/06/2016   HCT 38.4 06/06/2016   MCV 82.7 06/06/2016   PLT 84 (L) 06/06/2016   NEUTROABS 3.3 06/06/2016    Imaging:  No results found.  Medications: I have reviewed the patient's current medications.  Assessment/Plan: 1. 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 CTschest, abdomen, and pelvis on 12/11/2015-indeterminate tiny pulmonary nodules, multiple liver metastases, small nodes in the sigmoid mesocolon ? Laparoscopic sigmoid colectomy, liver biopsy, Port-A-Cath placement 01/14/2016 ? Pathology sigmoid colon resection-colonic adenocarcinoma, 5.3 cm extending into pericolonic connective tissue, positive lymph-vascular invasion, positive perineural invasion, negative margins, metastatic carcinoma in9 of 28 lymph nodes ? Pathology liver biopsy-metastatic  colorectal adenocarcinoma ? MSI stable; mismatch repair protein normal ? APC alteration, K RAS wild-type, no BRAF mutation ? Cycle 1 FOLFOX 02/02/2016 ? Cycle 2 FOLFOX 02/15/2016 ? Cycle 3 FOLFOX 02/29/2016 ? Cycle 4 FOLFOX 03/14/2016 ? Cycle 5 FOLFOX 03/28/2016 ? Cycle 6 FOLFOX 04/11/2016 (oxaliplatin held secondary to thrombocytopenia)  ? 04/21/2016 restaging CTs-difficulty evaluating liver lesions due to hepatic steatosis. Stable right adrenal nodule. No adenopathy or local recurrence near the rectosigmoid anastomotic site. ? Cycle 7 FOLFOX 04/25/2016 ? MRI liver 05/02/2016-partial improvement in hepatic metastases ? Cycle 8 FOLFOX 05/10/2016 ? Cycle 9 FOLFOX 05/23/2016 (oxaliplatin held due to thrombocytopenia) ? Cycle 10 FOLFOX 06/06/2016 (oxaliplatin dose reduced due to thrombocytopenia)  2. Rectal bleeding and constipation secondary to #1  3. History of peripheral vascular disease, status post left lower extremity vascular bypass surgery in April 2017  4. History of nephrolithiasis  5. History of Graves' disease treated with radioactive iodine  6. Anxiety/depression  7. Hypertension  8. Hospitalization 01/19/2016 with wound dehiscence status post secondary suture closure of abdominal wall  9. Thrombocytopeniasecondary to chemotherapy-oxaliplatin held with cycle 6 and cycle 9 FOLFOX   Disposition: Jeff [name] appears stable. He has completed 9 cycles of FOLFOX. Oxaliplatin was held with cycle 9 due to thrombocytopenia. Platelet count is mildly improved today. Plan to proceed with cycle 10 today as scheduled. The oxaliplatin will be dose reduced to 65 mg/m. He will return for a follow-up visit and cycle 11 in 2 weeks. He will contact the office in the interim with any problems.  Plan reviewed with Dr. Benay Spice.    Ned Card ANP/GNP-BC   06/06/2016  9:13 AM

## 2016-06-06 NOTE — Telephone Encounter (Signed)
Appointments scheduled per 1/22 LOS. Patient given AVS report and calendars with future scheduled appointments.  °

## 2016-06-07 ENCOUNTER — Encounter: Payer: Self-pay | Admitting: Surgery

## 2016-06-08 ENCOUNTER — Ambulatory Visit (HOSPITAL_BASED_OUTPATIENT_CLINIC_OR_DEPARTMENT_OTHER): Payer: Medicaid Other

## 2016-06-08 VITALS — BP 137/84 | HR 94 | Temp 97.8°F | Resp 18

## 2016-06-08 DIAGNOSIS — C787 Secondary malignant neoplasm of liver and intrahepatic bile duct: Secondary | ICD-10-CM

## 2016-06-08 DIAGNOSIS — Z452 Encounter for adjustment and management of vascular access device: Secondary | ICD-10-CM | POA: Diagnosis present

## 2016-06-08 DIAGNOSIS — C187 Malignant neoplasm of sigmoid colon: Secondary | ICD-10-CM | POA: Diagnosis not present

## 2016-06-08 MED ORDER — SODIUM CHLORIDE 0.9% FLUSH
10.0000 mL | INTRAVENOUS | Status: DC | PRN
  Administered 2016-06-08: 10 mL
  Filled 2016-06-08: qty 10

## 2016-06-08 MED ORDER — HEPARIN SOD (PORK) LOCK FLUSH 100 UNIT/ML IV SOLN
500.0000 [IU] | Freq: Once | INTRAVENOUS | Status: AC | PRN
  Administered 2016-06-08: 500 [IU]
  Filled 2016-06-08: qty 5

## 2016-06-08 NOTE — Patient Instructions (Signed)

## 2016-06-13 ENCOUNTER — Encounter (HOSPITAL_COMMUNITY): Payer: Medicaid Other

## 2016-06-13 ENCOUNTER — Other Ambulatory Visit (HOSPITAL_COMMUNITY): Payer: Medicaid Other

## 2016-06-13 ENCOUNTER — Ambulatory Visit: Payer: Medicaid Other | Admitting: Surgery

## 2016-06-17 ENCOUNTER — Other Ambulatory Visit: Payer: Self-pay | Admitting: Oncology

## 2016-06-17 ENCOUNTER — Other Ambulatory Visit: Payer: Self-pay | Admitting: *Deleted

## 2016-06-17 DIAGNOSIS — C787 Secondary malignant neoplasm of liver and intrahepatic bile duct: Secondary | ICD-10-CM

## 2016-06-17 DIAGNOSIS — C189 Malignant neoplasm of colon, unspecified: Secondary | ICD-10-CM

## 2016-06-19 ENCOUNTER — Other Ambulatory Visit: Payer: Self-pay | Admitting: Oncology

## 2016-06-20 ENCOUNTER — Ambulatory Visit (HOSPITAL_BASED_OUTPATIENT_CLINIC_OR_DEPARTMENT_OTHER): Payer: Medicaid Other

## 2016-06-20 ENCOUNTER — Ambulatory Visit: Payer: Medicaid Other

## 2016-06-20 ENCOUNTER — Other Ambulatory Visit (HOSPITAL_COMMUNITY)
Admission: RE | Admit: 2016-06-20 | Discharge: 2016-06-20 | Disposition: A | Discharge status: Home / Self Care | Payer: Medicaid Other | Source: Ambulatory Visit | Attending: Oncology | Admitting: Oncology

## 2016-06-20 ENCOUNTER — Encounter: Payer: Self-pay | Admitting: *Deleted

## 2016-06-20 ENCOUNTER — Telehealth: Payer: Self-pay | Admitting: Oncology

## 2016-06-20 ENCOUNTER — Other Ambulatory Visit: Payer: Self-pay | Admitting: *Deleted

## 2016-06-20 ENCOUNTER — Telehealth: Payer: Self-pay | Admitting: *Deleted

## 2016-06-20 ENCOUNTER — Ambulatory Visit: Payer: Medicaid Other | Admitting: Nutrition

## 2016-06-20 ENCOUNTER — Other Ambulatory Visit: Payer: Self-pay | Admitting: Medical Oncology

## 2016-06-20 ENCOUNTER — Other Ambulatory Visit (HOSPITAL_BASED_OUTPATIENT_CLINIC_OR_DEPARTMENT_OTHER): Payer: Medicaid Other

## 2016-06-20 ENCOUNTER — Ambulatory Visit (HOSPITAL_BASED_OUTPATIENT_CLINIC_OR_DEPARTMENT_OTHER): Payer: Medicaid Other | Admitting: Oncology

## 2016-06-20 VITALS — BP 126/89 | HR 106 | Temp 98.0°F | Resp 18

## 2016-06-20 VITALS — BP 105/55 | HR 120 | Temp 97.7°F | Resp 17 | Ht 70.0 in | Wt 242.3 lb

## 2016-06-20 DIAGNOSIS — I10 Essential (primary) hypertension: Secondary | ICD-10-CM | POA: Diagnosis not present

## 2016-06-20 DIAGNOSIS — C787 Secondary malignant neoplasm of liver and intrahepatic bile duct: Secondary | ICD-10-CM

## 2016-06-20 DIAGNOSIS — R200 Anesthesia of skin: Secondary | ICD-10-CM | POA: Diagnosis not present

## 2016-06-20 DIAGNOSIS — Z95828 Presence of other vascular implants and grafts: Secondary | ICD-10-CM

## 2016-06-20 DIAGNOSIS — R739 Hyperglycemia, unspecified: Secondary | ICD-10-CM | POA: Diagnosis not present

## 2016-06-20 DIAGNOSIS — R110 Nausea: Secondary | ICD-10-CM

## 2016-06-20 DIAGNOSIS — C189 Malignant neoplasm of colon, unspecified: Secondary | ICD-10-CM

## 2016-06-20 DIAGNOSIS — C187 Malignant neoplasm of sigmoid colon: Secondary | ICD-10-CM

## 2016-06-20 LAB — CBC WITH DIFFERENTIAL/PLATELET
BASO%: 0 % (ref 0.0–2.0)
Basophils Absolute: 0 10*3/uL (ref 0.0–0.1)
EOS%: 3.2 % (ref 0.0–7.0)
Eosinophils Absolute: 0.2 10*3/uL (ref 0.0–0.5)
HCT: 42.1 % (ref 38.4–49.9)
HGB: 14.3 g/dL (ref 13.0–17.1)
LYMPH%: 49.3 % — ABNORMAL HIGH (ref 14.0–49.0)
MCH: 27.8 pg (ref 27.2–33.4)
MCHC: 34 g/dL (ref 32.0–36.0)
MCV: 81.7 fL (ref 79.3–98.0)
MONO#: 0.8 10*3/uL (ref 0.1–0.9)
MONO%: 10.7 % (ref 0.0–14.0)
NEUT#: 2.7 10*3/uL (ref 1.5–6.5)
NEUT%: 36.8 % — ABNORMAL LOW (ref 39.0–75.0)
Platelets: 115 10*3/uL — ABNORMAL LOW (ref 140–400)
RBC: 5.15 10*6/uL (ref 4.20–5.82)
RDW: 18.4 % — ABNORMAL HIGH (ref 11.0–14.6)
WBC: 7.5 10*3/uL (ref 4.0–10.3)
lymph#: 3.7 10*3/uL — ABNORMAL HIGH (ref 0.9–3.3)
nRBC: 0 % (ref 0–0)

## 2016-06-20 LAB — WHOLE BLOOD GLUCOSE
Glucose: 549 mg/dL — ABNORMAL HIGH (ref 70–100)
Glucose: 443 mg/dL — ABNORMAL HIGH (ref 70–100)
Glucose: 370 mg/dL — ABNORMAL HIGH (ref 70–100)
Glucose: 353 mg/dL — ABNORMAL HIGH (ref 70–100)
HRS PC: 1 Hours
HRS PC: 2.5 Hours
HRS PC: 0.5 Hours

## 2016-06-20 LAB — COMPREHENSIVE METABOLIC PANEL
ALT: 74 U/L — ABNORMAL HIGH (ref 17–63)
AST: 58 U/L — ABNORMAL HIGH (ref 15–41)
Albumin: 4.4 g/dL (ref 3.5–5.0)
Alkaline Phosphatase: 124 U/L (ref 38–126)
Anion gap: 20 — ABNORMAL HIGH (ref 5–15)
BUN: 13 mg/dL (ref 6–20)
CO2: 21 mmol/L — ABNORMAL LOW (ref 22–32)
Calcium: 9.7 mg/dL (ref 8.9–10.3)
Chloride: 91 mmol/L — ABNORMAL LOW (ref 101–111)
Creatinine, Ser: 1.76 mg/dL — ABNORMAL HIGH (ref 0.61–1.24)
GFR calc Af Amer: 51 mL/min — ABNORMAL LOW (ref >60)
GFR calc non Af Amer: 44 mL/min — ABNORMAL LOW (ref >60)
Glucose, Bld: 590 mg/dL (ref 65–99)
Potassium: 4.5 mmol/L (ref 3.5–5.1)
Sodium: 132 mmol/L — ABNORMAL LOW (ref 135–145)
Total Bilirubin: 1.1 mg/dL (ref 0.3–1.2)
Total Protein: 8.4 g/dL — ABNORMAL HIGH (ref 6.5–8.1)

## 2016-06-20 LAB — CEA (IN HOUSE-CHCC): CEA (CHCC-In House): 2.59 ng/mL (ref 0.00–5.00)

## 2016-06-20 LAB — TECHNOLOGIST REVIEW

## 2016-06-20 MED ORDER — SODIUM CHLORIDE 0.9% FLUSH
10.0000 mL | INTRAVENOUS | Status: DC | PRN
Start: 2016-06-20 — End: 2016-06-20
  Filled 2016-06-20: qty 10

## 2016-06-20 MED ORDER — INSULIN REGULAR HUMAN 100 UNIT/ML IJ SOLN
10.0000 [IU] | Freq: Once | INTRAMUSCULAR | Status: DC
  Filled 2016-06-20: qty 0.1

## 2016-06-20 MED ORDER — INSULIN REGULAR HUMAN 100 UNIT/ML IJ SOLN
10.0000 [IU] | Freq: Once | INTRAMUSCULAR | Status: AC
  Administered 2016-06-20: 10 [IU] via SUBCUTANEOUS
  Filled 2016-06-20: qty 0.1

## 2016-06-20 MED ORDER — SODIUM CHLORIDE 0.9% FLUSH
10.0000 mL | INTRAVENOUS | Status: DC | PRN
  Administered 2016-06-20: 10 mL via INTRAVENOUS
  Filled 2016-06-20: qty 10

## 2016-06-20 MED ORDER — HEPARIN SOD (PORK) LOCK FLUSH 100 UNIT/ML IV SOLN
500.0000 [IU] | Freq: Once | INTRAVENOUS | Status: AC | PRN
  Administered 2016-06-20: 500 [IU] via INTRAVENOUS
  Filled 2016-06-20: qty 5

## 2016-06-20 MED ORDER — INSULIN REGULAR HUMAN 100 UNIT/ML IJ SOLN
15.0000 [IU] | Freq: Once | INTRAMUSCULAR | Status: AC
  Administered 2016-06-20: 15 [IU] via SUBCUTANEOUS
  Filled 2016-06-20: qty 0.15

## 2016-06-20 MED ORDER — SODIUM CHLORIDE 0.9 % IV SOLN
INTRAVENOUS | Status: DC
  Administered 2016-06-20: 10:00:00 via INTRAVENOUS

## 2016-06-20 MED ORDER — INSULIN REGULAR HUMAN 100 UNIT/ML IJ SOLN
20.0000 [IU] | Freq: Once | INTRAMUSCULAR | Status: AC
  Administered 2016-06-20: 20 [IU] via SUBCUTANEOUS
  Filled 2016-06-20: qty 0.2

## 2016-06-20 MED ORDER — ONDANSETRON HCL 4 MG/2ML IJ SOLN
8.0000 mg | Freq: Once | INTRAMUSCULAR | Status: AC
  Administered 2016-06-20: 8 mg via INTRAVENOUS

## 2016-06-20 MED ORDER — ONDANSETRON HCL 4 MG/2ML IJ SOLN
INTRAMUSCULAR | Status: AC
  Filled 2016-06-20: qty 4

## 2016-06-20 MED ORDER — SODIUM CHLORIDE 0.9 % IV SOLN: Freq: Once | INTRAVENOUS | Status: DC

## 2016-06-20 NOTE — Progress Notes (Signed)
[place] OFFICE PROGRESS NOTE   Diagnosis: Colon cancer  INTERVAL HISTORY:   Jeff [name] completed another cycle of FOLFOX on 06/06/2016. He has noted increased cold sensitivity and numbness in the fingers. This does not interfere with activity. He developed a "cold "a few weeks ago and was prescribed steroids by his primary physician. He complains of feeling "dizzy ", polyuria, and nausea today. He vomited this morning. No diarrhea or fever. He has a cough.  Objective:  Vital signs in last 24 hours:  Blood pressure (!) 105/55, pulse (!) 120, temperature 97.7 F (36.5 C), temperature source Oral, resp. rate 17, height '5\' 10"'$  (1.778 m), weight 241 lb 9.6 oz (109.6 kg), SpO2 98 %.    HEENT: No thrush or ulcers Resp: Lungs clear bilaterally Cardio: Regular rate and rhythm, tachycardia GI: No hepatomegaly, nontender Vascular: No leg edema Neuro: Mild to moderate decrease in vibratory sense at the fingertips bilaterally     Portacath/PICC-without erythema  Lab Results:  Lab Results  Component Value Date   WBC 7.5 06/20/2016   HGB 14.3 06/20/2016   HCT 42.1 06/20/2016   MCV 81.7 06/20/2016   PLT 115 (L) 06/20/2016   NEUTROABS 2.7 06/20/2016   Potassium 4.5, glucose 590, BUN 13, creatinine 1.76 Medications: I have reviewed the patient's current medications.  Assessment/Plan: 1. 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 CTschest, abdomen, and pelvis on 12/11/2015-indeterminate tiny pulmonary nodules, multiple liver metastases, small nodes in the sigmoid mesocolon ? Laparoscopic sigmoid colectomy, liver biopsy, Port-A-Cath placement 01/14/2016 ? Pathology sigmoid colon resection-colonic adenocarcinoma, 5.3 cm extending into pericolonic connective tissue, positive lymph-vascular invasion, positive perineural invasion, negative margins, metastatic carcinoma in9 of 28 lymph  nodes ? Pathology liver biopsy-metastatic colorectal adenocarcinoma ? MSI stable; mismatch repair protein normal ? APC alteration, K RAS wild-type, no BRAF mutation ? Cycle 1 FOLFOX 02/02/2016 ? Cycle 2 FOLFOX 02/15/2016 ? Cycle 3 FOLFOX 02/29/2016 ? Cycle 4 FOLFOX 03/14/2016 ? Cycle 5 FOLFOX 03/28/2016 ? Cycle 6 FOLFOX 04/11/2016 (oxaliplatin held secondary to thrombocytopenia)  ? 04/21/2016 restaging CTs-difficulty evaluating liver lesions due to hepatic steatosis. Stable right adrenal nodule. No adenopathy or local recurrence near the rectosigmoid anastomotic site. ? Cycle 7 FOLFOX 04/25/2016 ? MRI liver 05/02/2016-partial improvement in hepatic metastases ? Cycle 8 FOLFOX 05/10/2016 ? Cycle 9 FOLFOX 05/23/2016 (oxaliplatin held due to thrombocytopenia) ? Cycle 10 FOLFOX 06/06/2016 (oxaliplatin dose reduced due to thrombocytopenia)  2. Rectal bleeding and constipation secondary to #1  3. History of peripheral vascular disease, status post left lower extremity vascular bypass surgery in April 2017  4. History of nephrolithiasis  5. History of Graves' disease treated with radioactive iodine  6. Anxiety/depression  7. Hypertension  8. Hospitalization 01/19/2016 with wound dehiscence status post secondary suture closure of abdominal wall  9. 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 Hyperglycemia 06/20/2016-he appears to have new onset diabetes   Disposition:  Jeff [name] has completed 10 cycles of systemic therapy for treatment of metastatic colon cancer. There is no evidence of progressive colon cancer. He had a recent upper respiratory infection. He appears to have diabetes. He appears dehydrated today. Chemotherapy will be placed on hold today. He will receive IV fluids and insulin while here. He will be educated in a diabetic diet. We will contact his primary physician with the request for an  appointment today.  Jeff [name] will return for an office visit  in one week. We will repeat his vital signs after intravenous fluids today.  40 minutes were spent with the patient today. The majority of the time was used for counseling and coordination of care.  Betsy Coder, MD  06/20/2016  9:35 AM

## 2016-06-20 NOTE — Progress Notes (Unsigned)
Critical glucose value given to Dr. Benay Spice and Ned Card at 12:55 pm 06-20-2016. sdd

## 2016-06-20 NOTE — Progress Notes (Signed)
06/20/2016 Patient arrived for flush appointment in infusion area.  Was actively vomiting and stated he had been this morning and took po antiemtic but vomited shortly thereafter at home this morning.  Grandson whom they provide care for also was vomiting this morning.  Gave patient a mask, and emesis bags. Took directly over to MD side instead of sending back to lobby.  Unable to get temperature while in infusion area because he had just ate some ice.

## 2016-06-20 NOTE — Patient Instructions (Signed)

## 2016-06-20 NOTE — Progress Notes (Signed)
Received a call from nursing that physician was requesting diabetic education secondary to blood glucose of 590.   Patient does not have a diagnosis of diabetes, however he has had increased blood sugars over the last month. Patient has been taking steroids briefly. Patient has appointment with his primary M.D. tomorrow. Brief education provided on limiting concentrated sweets and trying to choose high-fiber carbohydrate choices.  Encouraged patient to limit the amount of carbohydrates eaten at mealtimes and snacks. Asked patient to eliminate all soft drinks, and Sweet tea. Encouraged the use of artificial sweeteners. Patient needs to be referred to outpatient nutrition and diabetes services, for diabetic education.

## 2016-06-20 NOTE — Addendum Note (Signed)
Addended by: Henreitta Leber E on: 06/20/2016 09:04 AM   Modules accepted: Orders

## 2016-06-20 NOTE — Progress Notes (Signed)
Pt repeat cbg was greater than 500. Notified Dr.Sherrill since pt already finished with ivf. Obtained 20 units of regular subq insulin. Dr.Sherrill to call his family dr today to set up appt after infusion. Dr.Sherrill would like to have glucose rechecked in 1 hr post insulin.  20 units of insulin was given to pt. Rechecked glucose level at 443. Pt states that he feels much improved and that his mouth is not as dry anymore and he doesn't feel as thirsty. Notified Dr.Sherrill's nurse and awaiting for further instructions before discharge. Pt currently running ivf at kvo rate  Pt given 15unit sub q regular insulin. Rechecked cbg in 1 hr and was 370. Notified Dr.Sherrill and would like to repeat again in 30 minutes. Last cbg check at 353. Notified MD. Pt to have another 10units subq and then ok to discharge home. Pt verbalized understanding. Gave pt education on possible hypoglycemia and what to do if he starts to have symptoms. Pt understands and will call if he has any symptoms or concerns.

## 2016-06-20 NOTE — Telephone Encounter (Signed)
Message sent to infusion scheduler to be added. Appointments scheduled per 06/20/16 los. Patient was given a copy of the appointment schedule and AVS report, per 06/20/16 los. °

## 2016-06-20 NOTE — Telephone Encounter (Signed)
Per 2/5 LOS and staffing message I have scheduled appts and gave calendar. Notified the scheduler

## 2016-06-20 NOTE — Patient Instructions (Addendum)
Dehydration, Adult Dehydration is when there is not enough fluid or water in your body. This happens when you lose more fluids than you take in. Dehydration can range from mild to very bad. It should be treated right away to keep it from getting very bad. Symptoms of mild dehydration may include:  Thirst.  Dry lips.  Slightly dry mouth.  Dry, warm skin.  Dizziness. Symptoms of moderate dehydration may include:  Very dry mouth.  Muscle cramps.  Dark pee (urine). Pee may be the color of tea.  Your body making less pee.  Your eyes making fewer tears.  Heartbeat that is uneven or faster than normal (palpitations).  Headache.  Light-headedness, especially when you stand up from sitting.  Fainting (syncope). Symptoms of very bad dehydration may include:  Changes in skin, such as:  Cold and clammy skin.  Blotchy (mottled) or pale skin.  Skin that does not quickly return to normal after being lightly pinched and let go (poor skin turgor).  Changes in body fluids, such as:  Feeling very thirsty.  Your eyes making fewer tears.  Not sweating when body temperature is high, such as in hot weather.  Your body making very little pee.  Changes in vital signs, such as:  Weak pulse.  Pulse that is more than 100 beats a minute when you are sitting still.  Fast breathing.  Low blood pressure.  Other changes, such as:  Sunken eyes.  Cold hands and feet.  Confusion.  Lack of energy (lethargy).  Trouble waking up from sleep.  Short-term weight loss.  Unconsciousness. Follow these instructions at home:  If told by your doctor, drink an ORS:  Make an ORS by using instructions on the package.  Start by drinking small amounts, about  cup (120 mL) every 5-10 minutes.  Slowly drink more until you have had the amount that your doctor said to have.  Drink enough clear fluid to keep your pee clear or pale yellow. If you were told to drink an ORS, finish the ORS  first, then start slowly drinking clear fluids. Drink fluids such as:  Water. Do not drink only water by itself. Doing that can make the salt (sodium) level in your body get too low (hyponatremia).  Ice chips.  Fruit juice that you have added water to (diluted).  Low-calorie sports drinks.  Avoid:  Alcohol.  Drinks that have a lot of sugar. These include high-calorie sports drinks, fruit juice that does not have water added, and soda.  Caffeine.  Foods that are greasy or have a lot of fat or sugar.  Take over-the-counter and prescription medicines only as told by your doctor.  Do not take salt tablets. Doing that can make the salt level in your body get too high (hypernatremia).  Eat foods that have minerals (electrolytes). Examples include bananas, oranges, potatoes, tomatoes, and spinach.  Keep all follow-up visits as told by your doctor. This is important. Contact a doctor if:  You have belly (abdominal) pain that:  Gets worse.  Stays in one area (localizes).  You have a rash.  You have a stiff neck.  You get angry or annoyed more easily than normal (irritability).  You are more sleepy than normal.  You have a harder time waking up than normal.  You feel:  Weak.  Dizzy.  Very thirsty.  You have peed (urinated) only a small amount of very dark pee during 6-8 hours. Get help right away if:  You have symptoms of  very bad dehydration.  You cannot drink fluids without throwing up (vomiting).  Your symptoms get worse with treatment.  You have a fever.  You have a very bad headache.  You are throwing up or having watery poop (diarrhea) and it:  Gets worse.  Does not go away.  You have blood or something green (bile) in your throw-up.  You have blood in your poop (stool). This may cause poop to look black and tarry.  You have not peed in 6-8 hours.  You pass out (faint).  Your heart rate when you are sitting still is more than 100 beats a  minute.  You have trouble breathing. This information is not intended to replace advice given to you by your health care provider. Make sure you discuss any questions you have with your health care provider. Document Released: 02/26/2009 Document Revised: 11/20/2015 Document Reviewed: 06/26/2015 Elsevier Interactive Patient Education  2017 [employer]   Correction Insulin Correction insulin, also called corrective insulin or a supplemental dose, is a small amount of insulin that can be used to lower your blood sugar (glucose) if it is too high. You may be instructed to check your blood glucose at certain times of the day and to use correction insulin as needed to lower your blood glucose to your target range. Correction insulin is primarily used as part of diabetes management. It may also be prescribed for people who do not have diabetes. What is a correction scale? A correction scale, also called a sliding scale, is prescribed by your health care provider to help you determine when you need correction insulin. Your correction scale is based on your individual treatment goals, and it has two parts:  Ranges of blood glucose levels.  How much correction insulin to give yourself if your blood sugar falls within a certain range. If your blood glucose is in your desired range, you will not need correction insulin and you should take your normal insulin dose. What type of insulin do I need? Your health care provider may prescribe rapid-acting or short-acting insulin for you to use as correction insulin. Rapid-acting insulin:  Starts working quickly, in as little as 5 minutes.  Can last for 3-6 hours.  Works well when taken right before a meal to quickly lower blood glucose. Short-acting insulin:  Starts working in about 30 minutes.  Can last for 6-8 hours.  Should be taken about 30 minutes before you start eating a meal. Talk with your health care provider or pharmacist about which  type of correction insulin to take and when to take it. If you use insulin to control your diabetes, you should use correction insulin in addition to the longer-acting (basal) insulin that you normally use. How do I manage my blood glucose with correction insulin? Giving a correction dose  Check your blood glucose as directed by your health care provider.  Use your correction scale to find the range that your blood glucose is in.  Identify the units of insulin that match your blood glucose range.  Make sure you have food available that you can eat in the next 15-30 minutes, after your correction dose.  Give yourself the dose of correction insulin that your health care provider has prescribed in your correction scale. Always make sure you are using the right type of insulin.  If your correction insulin is rapid-acting, start eating a meal within 15 minutes after your correction dose to keep your blood glucose from getting too low.  If  your correction insulin is short-acting, start eating a meal within 30 minutes after your correction dose to keep your blood glucose from getting too low. Keeping a blood glucose log  Write down your blood glucose test results and the amount of insulin that you give yourself. Do this every time you check blood glucose or take insulin. Bring this log with you to your medical visits. This information will help your health care provider to manage your medicines.  Note anything that may affect your blood glucose, such as:  Changes in normal exercise or activity.  Changes in your normal schedule, such as changes in your sleep routine, going on vacation, changing your diet, or holidays.  New over-the-counter or prescription medicines.  Illness, stress, or anxiety.  Changes in the time that you took your medicine or insulin.  Changes in your meals, such as skipping a meal, having a late meal, or dining out.  Eating things that may affect blood glucose, such  as snacks, meal portions that are larger than normal, drinks that contain sugar, or eating less than usual. What do I need to know about hyperglycemia and hypoglycemia? What is hyperglycemia? Hyperglycemia, also called high blood glucose, occurs when blood glucose is too high. Make sure you know the early signs of hyperglycemia, such as:  Increased thirst.  Hunger.  Feeling very tired.  Needing to urinate more often than usual.  Blurry vision. What is hypoglycemia? Hypoglycemia is also called low blood glucose. Be aware of "stacking" your insulin doses. This happens when you correct a high blood glucose by giving yourself extra insulin too soon after a previous correction dose or mealtime dose. This may cause you to have too much insulin in your body and may put you at risk for hypoglycemia. Hypoglycemia occurs with a blood glucose level at or below 70 mg/dL (3.9 mmol/L). It is important to know the symptoms of hypoglycemia and treat it right away. Always have a 15-gram rapid-acting carbohydrate snack with you to treat low blood glucose. Family members and close friends should also know the symptoms and should understand how to treat hypoglycemia, in case you are not able to treat yourself. What are the symptoms of hypoglycemia? Hypoglycemia symptoms can include:  Hunger.  Anxiety.  Sweating and feeling clammy.  Confusion.  Dizziness or light-headedness.  Sleepiness.  Nausea.  Increased heart rate.  Headache.  Blurry vision.  Jerky movements that you cannot control (seizure).  Nightmares.  Tingling or numbness around the mouth, lips, or tongue.  A change in speech.  Decreased ability to concentrate.  A change in coordination.  Restless sleep.  Tremors or shakes.  Fainting.  Irritability. How do I treat hypoglycemia? If you are alert and able to swallow safely, follow the 15:15 rule:  Take 15 grams of a rapid-acting carbohydrate. Rapid-acting options  include:  1 tube of glucose gel.  3 glucose pills.  6-8 pieces of hard candy.  4 oz (120 mL) of fruit juice.  4 oz (120 mL) of regular (not diet) soda.  Check your blood glucose 15 minutes after you take the carbohydrate.  If the repeat blood glucose level is still at or below 70 mg/dL (3.9 mmol/L), take 15 grams of a carbohydrate again.  If your blood glucose level does not increase above 70 mg/dL (3.9 mmol/L) after 3 tries, seek emergency medical care.  After your blood glucose level returns to normal, eat a meal or a snack within 1 hour. How do I treat severe hypoglycemia?  Severe hypoglycemia is when your blood glucose level is at or below 54 mg/dL (3 mmol/L). Severe hypoglycemia is an emergency. Do not wait to see if the symptoms will go away. Get medical help right away. Call your local emergency services (911 in the [place] Do not drive yourself to the hospital. If you have severe hypoglycemia and you cannot eat or drink, you may need an injection of glucagon. A family member or close friend should learn how to check your blood glucose and how to give you a glucagon injection. Ask your health care provider if you need to have an emergency glucagon injection kit available. Severe hypoglycemia may need to be treated in a hospital. The treatment may include getting glucose through an IV tube. You may also need treatment for the cause of your hypoglycemia. Why do I need correction insulin if I do not have diabetes? If you do not have diabetes, your health care provider may prescribe insulin because:  Keeping your blood glucose in the target range is important for your overall health.  You are taking medicines that cause your blood glucose to be higher than normal. Contact a health care provider if:  You develop low blood glucose that you are not able to treat yourself.  You have high blood glucose that you are not able to correct with correction insulin.  Your blood glucose is  often too low.  You used emergency glucagon to treat low blood glucose. Get help right away if:  You become unresponsive. If this happens, someone else should call emergency services (911 in the [place] right away.  Your blood glucose is lower than 54 mg/dL (3.0 mmol/L).  You become confused or you have trouble thinking clearly.  You have difficulty breathing. Summary  Correction insulin is a small amount of insulin that can be used to lower your blood sugar (glucose) if it is too high.  Talk with your health care provider or pharmacist about which type of correction insulin to take and when to take it. If you use insulin to control your diabetes, you should use correction insulin in addition to the longer-acting (basal) insulin that you normally use.  You may be instructed to check your blood glucose at certain times of the day and to use correction insulin as needed to lower your blood glucose to your target range. Always keep a log of your blood glucose values and the amount of insulin that you used.  It is important to know the symptoms of hypoglycemia and treat it right away. Always have a 15-gram rapid-acting carbohydrate snack with you to treat low blood glucose. Family members and close friends should also know the symptoms and should understand how to treat hypoglycemia, in case you are not able to treat yourself. This information is not intended to replace advice given to you by your health care provider. Make sure you discuss any questions you have with your health care provider. Document Released: 09/23/2010 Document Revised: 01/29/2016 Document Reviewed: 01/29/2016 Elsevier Interactive Patient Education  2017 [employer].    Hypoglycemia  Hypoglycemia is when the sugar (glucose) level in the blood is too low. Symptoms of low blood sugar may include:  Feeling:  Hungry.  Worried or nervous (anxious).  Sweaty and clammy.  Confused.  Dizzy.  Sleepy.  Sick to your  stomach (nauseous).  Having:  A fast heartbeat.  A headache.  A change in your vision.  Jerky movements that you cannot control (seizure).  Nightmares.  Tingling  or no feeling (numbness) around the mouth, lips, or tongue.  Having trouble with:  Talking.  Paying attention (concentrating).  Moving (coordination).  Sleeping.  Shaking.  Passing out (fainting).  Getting upset easily (irritability). Low blood sugar can happen to people who have diabetes and people who do not have diabetes. Low blood sugar can happen quickly, and it can be an emergency. Treating Low Blood Sugar  Low blood sugar is often treated by eating or drinking something sugary right away. If you can think clearly and swallow safely, follow the 15:15 rule:  Take 15 grams of a fast-acting carb (carbohydrate). Some fast-acting carbs are:  1 tube of glucose gel.  3 sugar tablets (glucose pills).  6-8 pieces of hard candy.  4 oz (120 mL) of fruit juice.  4 oz (120 mL) of regular (not diet) soda.  Check your blood sugar 15 minutes after you take the carb.  If your blood sugar is still at or below 70 mg/dL (3.9 mmol/L), take 15 grams of a carb again.  If your blood sugar does not go above 70 mg/dL (3.9 mmol/L) after 3 tries, get help right away.  After your blood sugar goes back to normal, eat a meal or a snack within 1 hour. Treating Very Low Blood Sugar  If your blood sugar is at or below 54 mg/dL (3 mmol/L), you have very low blood sugar (severe hypoglycemia). This is an emergency. Do not wait to see if the symptoms will go away. Get medical help right away. Call your local emergency services (911 in the [place] Do not drive yourself to the hospital. If you have very low blood sugar and you cannot eat or drink, you may need a glucagon shot (injection). A family member or friend should learn how to check your blood sugar and how to give you a glucagon shot. Ask your doctor if you need to have a  glucagon shot kit at home. Follow these instructions at home: General instructions  Avoid any diets that cause you to not eat enough food. Talk with your doctor before you start any new diet.  Take over-the-counter and prescription medicines only as told by your doctor.  Limit alcohol to no more than 1 drink per day for nonpregnant women and 2 drinks per day for men. One drink equals 12 oz of beer, 5 oz of wine, or 1 oz of hard liquor.  Keep all follow-up visits as told by your doctor. This is important. If You Have Diabetes:   Make sure you know the symptoms of low blood sugar.  Always keep a source of sugar with you, such as:  Sugar.  Sugar tablets.  Glucose gel.  Fruit juice.  Regular soda (not diet soda).  Milk.  Hard candy.  Honey.  Take your medicines as told.  Follow your exercise and meal plan.  Eat on time. Do not skip meals.  Follow your sick day plan when you cannot eat or drink normally. Make this plan ahead of time with your doctor.  Check your blood sugar as often as told by your doctor. Always check before and after exercise.  Share your diabetes care plan with:  Your work or school.  People you live with.  Check your pee (urine) for ketones:  When you are sick.  As told by your doctor.  Carry a card or wear jewelry that says you have diabetes. If You Have Low Blood Sugar From Other Causes:   Check your blood sugar  as often as told by your doctor.  Follow instructions from your doctor about what you cannot eat or drink. Contact a doctor if:  You have trouble keeping your blood sugar in your target range.  You have low blood sugar often. Get help right away if:  You still have symptoms after you eat or drink something sugary.  Your blood sugar is at or below 54 mg/dL (3 mmol/L).  You have jerky movements that you cannot control.  You pass out. These symptoms may be an emergency. Do not wait to see if the symptoms will go away.  Get medical help right away. Call your local emergency services (911 in the [place] Do not drive yourself to the hospital.  This information is not intended to replace advice given to you by your health care provider. Make sure you discuss any questions you have with your health care provider. Document Released: 07/27/2009 Document Revised: 10/08/2015 Document Reviewed: 06/05/2015 Elsevier Interactive Patient Education  2017 [employer].

## 2016-06-20 NOTE — Progress Notes (Signed)
Dr. Benay Spice made aware of glucose level 353: Order received for regular insulin 10 units prior to discharge. May, Infusion RN made aware.

## 2016-06-20 NOTE — Telephone Encounter (Addendum)
Called Lucita Lora, NP (pt's PCP) informed her of glucose level 590. Requested they see pt today for new diagnosis diabetes. She is unable to work pt in today, appt given for 2/6 @ 1045.  Pt informed of above appt. Dr. Benay Spice made aware. Referral made to dietitian for diabetic education.

## 2016-06-20 NOTE — Progress Notes (Signed)
06/20/2016 Per Dr. Benay Spice, patients primary MD unable to see him today.  He wants to give another Insulin 15 units and repeat CBG 1 hour after.  Goal for discharge is CBG <300.  Also requested that Pamala Hurry the dietician come by to consult with patient with newly DM diagnosis.

## 2016-06-21 ENCOUNTER — Telehealth: Payer: Self-pay | Admitting: *Deleted

## 2016-06-21 NOTE — Telephone Encounter (Signed)
Message left for pt to call [facility] back.

## 2016-06-21 NOTE — Telephone Encounter (Signed)
Left message for pt to call [name] back to inform Dr. Benay [name] how his appt went with his PCP this am regarding his elevated blood sugar.  Message left x2 today for pt to return call to [facility].

## 2016-06-22 ENCOUNTER — Telehealth: Payer: Self-pay | Admitting: *Deleted

## 2016-06-22 NOTE — Telephone Encounter (Signed)
Call placed to patient's wife regarding pt.'s elevated blood sugar on 06/20/16.  Patient's wife states that pt was able to get in to see his PCP this AM and has been started on insulin twice a day with blood sugar checks at home.  She states that she is educated and comfortable in giving insulin and checking blood sugars at home. Dr. Benay Spice notified.

## 2016-06-22 NOTE — Telephone Encounter (Signed)
error 

## 2016-06-27 ENCOUNTER — Ambulatory Visit: Payer: Medicaid Other

## 2016-06-27 ENCOUNTER — Other Ambulatory Visit (HOSPITAL_BASED_OUTPATIENT_CLINIC_OR_DEPARTMENT_OTHER): Payer: Medicaid Other

## 2016-06-27 ENCOUNTER — Encounter: Payer: Self-pay | Admitting: Nurse Practitioner

## 2016-06-27 ENCOUNTER — Ambulatory Visit (HOSPITAL_BASED_OUTPATIENT_CLINIC_OR_DEPARTMENT_OTHER): Payer: Medicaid Other | Admitting: Nurse Practitioner

## 2016-06-27 ENCOUNTER — Telehealth: Payer: Self-pay | Admitting: Oncology

## 2016-06-27 ENCOUNTER — Other Ambulatory Visit (HOSPITAL_COMMUNITY)
Admission: RE | Admit: 2016-06-27 | Discharge: 2016-06-27 | Disposition: A | Discharge status: Home / Self Care | Payer: Medicaid Other | Source: Ambulatory Visit | Attending: Oncology | Admitting: Oncology

## 2016-06-27 VITALS — BP 136/84 | HR 109 | Temp 98.3°F | Resp 18 | Ht 70.0 in | Wt 246.4 lb

## 2016-06-27 DIAGNOSIS — E1165 Type 2 diabetes mellitus with hyperglycemia: Secondary | ICD-10-CM | POA: Diagnosis not present

## 2016-06-27 DIAGNOSIS — C787 Secondary malignant neoplasm of liver and intrahepatic bile duct: Secondary | ICD-10-CM | POA: Diagnosis not present

## 2016-06-27 DIAGNOSIS — Z95828 Presence of other vascular implants and grafts: Secondary | ICD-10-CM

## 2016-06-27 DIAGNOSIS — I10 Essential (primary) hypertension: Secondary | ICD-10-CM

## 2016-06-27 DIAGNOSIS — C187 Malignant neoplasm of sigmoid colon: Secondary | ICD-10-CM

## 2016-06-27 DIAGNOSIS — R110 Nausea: Secondary | ICD-10-CM

## 2016-06-27 DIAGNOSIS — C189 Malignant neoplasm of colon, unspecified: Secondary | ICD-10-CM

## 2016-06-27 LAB — CBC WITH DIFFERENTIAL/PLATELET
BASO%: 0.2 % (ref 0.0–2.0)
Basophils Absolute: 0 10*3/uL (ref 0.0–0.1)
EOS%: 2.8 % (ref 0.0–7.0)
Eosinophils Absolute: 0.2 10*3/uL (ref 0.0–0.5)
HCT: 38.4 % (ref 38.4–49.9)
HGB: 13.8 g/dL (ref 13.0–17.1)
LYMPH%: 53.1 % — ABNORMAL HIGH (ref 14.0–49.0)
MCH: 29.6 pg (ref 27.2–33.4)
MCHC: 35.9 g/dL (ref 32.0–36.0)
MCV: 82.4 fL (ref 79.3–98.0)
MONO#: 1.1 10*3/uL — ABNORMAL HIGH (ref 0.1–0.9)
MONO%: 19.6 % — ABNORMAL HIGH (ref 0.0–14.0)
NEUT#: 1.3 10*3/uL — ABNORMAL LOW (ref 1.5–6.5)
NEUT%: 24.3 % — ABNORMAL LOW (ref 39.0–75.0)
Platelets: 154 10*3/uL (ref 140–400)
RBC: 4.66 10*6/uL (ref 4.20–5.82)
RDW: 19.2 % — ABNORMAL HIGH (ref 11.0–14.6)
WBC: 5.4 10*3/uL (ref 4.0–10.3)
lymph#: 2.9 10*3/uL (ref 0.9–3.3)
nRBC: 0 % (ref 0–0)

## 2016-06-27 LAB — COMPREHENSIVE METABOLIC PANEL
ALT: 63 U/L (ref 17–63)
AST: 57 U/L — ABNORMAL HIGH (ref 15–41)
Albumin: 3.9 g/dL (ref 3.5–5.0)
Alkaline Phosphatase: 112 U/L (ref 38–126)
Anion gap: 15 (ref 5–15)
BUN: 12 mg/dL (ref 6–20)
CO2: 20 mmol/L — ABNORMAL LOW (ref 22–32)
Calcium: 9.2 mg/dL (ref 8.9–10.3)
Chloride: 98 mmol/L — ABNORMAL LOW (ref 101–111)
Creatinine, Ser: 1.37 mg/dL — ABNORMAL HIGH (ref 0.61–1.24)
GFR calc Af Amer: >60 mL/min (ref >60)
GFR calc non Af Amer: 59 mL/min — ABNORMAL LOW (ref >60)
Glucose, Bld: 403 mg/dL — ABNORMAL HIGH (ref 65–99)
Potassium: 4.2 mmol/L (ref 3.5–5.1)
Sodium: 133 mmol/L — ABNORMAL LOW (ref 135–145)
Total Bilirubin: 1 mg/dL (ref 0.3–1.2)
Total Protein: 7.5 g/dL (ref 6.5–8.1)

## 2016-06-27 LAB — TECHNOLOGIST REVIEW: Technologist Review: 1

## 2016-06-27 MED ORDER — SODIUM CHLORIDE 0.9% FLUSH
10.0000 mL | INTRAVENOUS | Status: DC | PRN
  Administered 2016-06-27: 10 mL via INTRAVENOUS
  Filled 2016-06-27: qty 10

## 2016-06-27 MED ORDER — HEPARIN SOD (PORK) LOCK FLUSH 100 UNIT/ML IV SOLN
500.0000 [IU] | Freq: Once | INTRAVENOUS | Status: AC | PRN
  Administered 2016-06-27: 500 [IU] via INTRAVENOUS
  Filled 2016-06-27: qty 5

## 2016-06-27 NOTE — Progress Notes (Addendum)
[facility] OFFICE PROGRESS NOTE   Diagnosis:  Colon cancer  INTERVAL HISTORY:   Mr. Fernandez returns as scheduled. He completed cycle 10 FOLFOX 06/06/2016. Chemotherapy was held on 06/20/2016 due to hyperglycemia/new-onset diabetes. He reports he is now on insulin, twice a day. He notes improved control of blood sugars. He has intermittent nausea/vomiting. Mouth feels "dry". No mouth sores. He notes decreased urinary frequency. No diarrhea. He intermittently notes blood after straining for a bowel movement. He has increased numbness in the fingers and toes. Toes feel "cold".  Objective:  Vital signs in last 24 hours:  Blood pressure 136/84, pulse (!) 109, temperature 98.3 F (36.8 C), temperature source Oral, resp. rate 18, height 5\' 10"  (1.778 m), weight 246 lb 6.4 oz (111.8 kg), SpO2 100 %.    HEENT: No thrush or ulcers. Resp: Lungs clear bilaterally. Cardio: Regular rate and rhythm. GI: Abdomen soft and nontender. No hepatomegaly. Vascular: No leg edema. Calves soft and nontender. Neuro: Vibratory sense intact over the fingertips per tuning fork exam.  Portacath without erythema.   Lab Results:  Lab Results  Component Value Date   WBC 7.5 06/20/2016   HGB 14.3 06/20/2016   HCT 42.1 06/20/2016   MCV 81.7 06/20/2016   PLT 115 (L) 06/20/2016   NEUTROABS 2.7 06/20/2016    Imaging:  No results found.  Medications: I have reviewed the patient's current medications.  Assessment/Plan: 1. 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 CTschest, abdomen, and pelvis on 12/11/2015-indeterminate tiny pulmonary nodules, multiple liver metastases, small nodes in the sigmoid mesocolon ? Laparoscopic sigmoid colectomy, liver biopsy, Port-A-Cath placement 01/14/2016 ? Pathology sigmoid colon resection-colonic adenocarcinoma, 5.3 cm extending into pericolonic connective tissue, positive  lymph-vascular invasion, positive perineural invasion, negative margins, metastatic carcinoma in9 of 28 lymph nodes ? Pathology liver biopsy-metastatic colorectal adenocarcinoma ? MSI stable; mismatch repair protein normal ? APC alteration, K RAS wild-type, no BRAF mutation ? Cycle 1 FOLFOX 02/02/2016 ? Cycle 2 FOLFOX 02/15/2016 ? Cycle 3 FOLFOX 02/29/2016 ? Cycle 4 FOLFOX 03/14/2016 ? Cycle 5 FOLFOX 03/28/2016 ? Cycle 6 FOLFOX 04/11/2016 (oxaliplatin held secondary to thrombocytopenia)  ? 04/21/2016 restaging CTs-difficulty evaluating liver lesions due to hepatic steatosis. Stable right adrenal nodule. No adenopathy or local recurrence near the rectosigmoid anastomotic site. ? Cycle 7 FOLFOX 04/25/2016 ? MRI liver 05/02/2016-partial improvement in hepatic metastases ? Cycle 8 FOLFOX 05/10/2016 ? Cycle 9 FOLFOX 05/23/2016 (oxaliplatin held due to thrombocytopenia) ? Cycle 10 FOLFOX 06/06/2016 (oxaliplatin dose reduced due to thrombocytopenia) ? Cycle 11 FOLFOX 06/27/2016 (oxaliplatin held due to neuropathy)  2. Rectal bleeding and constipation secondary to #1  3. History of peripheral vascular disease, status post left lower extremity vascular bypass surgery in April 2017  4. History of nephrolithiasis  5. History of Graves' disease treated with radioactive iodine  6. Anxiety/depression  7. Hypertension  8. Hospitalization 01/19/2016 with wound dehiscence status post secondary suture closure of abdominal wall  9. 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 Hyperglycemia 06/20/2016-he appears to have new onset diabetes. Maintained on insulin.     Disposition: Mr. Bergfeld appears stable. He has completed 10 cycles of FOLFOX. Plan to proceed with cycle 11 today as scheduled. Oxaliplatin will be held due to increased neuropathy symptoms.  He is now maintained on insulin. He will continue to monitor  blood sugars closely.  Etiology of intermittent nausea is unclear. Question if related to hyperglycemia,  question anticipatory nausea.  He will return for a follow-up visit in 2 weeks. We began preliminary discussion regarding "maintenance" therapy with Xeloda.  Patient seen with Dr. Benay Spice. 25 minutes were spent face-to-face at today's visit with the majority of that time involved in counseling/coordination of care.    Ned Card ANP/GNP-BC   06/27/2016  11:47 AM  This was a shared visit with Ned Card. Mr. Glockner was interviewed and examined. I suspect the intermittent nausea is related to persistent hyperglycemia. The blood glucose returned at 403 on the chemistry panel today. We contacted his primary physician to let them know his blood sugar remains markedly elevated. Chemotherapy was held today.  Julieanne Manson, M.D.

## 2016-06-27 NOTE — Progress Notes (Signed)
Per Ned Card, NP patient is not to receive treatment today due to blood sugar being 390. Patient is to see primary doctor as soon as possible. Patient made aware and verbalized understanding. Per Meredeth Ide, RN patient's primary care doctors office was contacted and is to call the patient to schedule an appointment. Patient stable upon discharge.

## 2016-06-27 NOTE — Patient Instructions (Signed)
Patient not receiving treatment today.

## 2016-06-27 NOTE — Telephone Encounter (Signed)
Discrepancy with treatment orders from [facility] that reflected chemo, lab, flush and follow up to be cancelled for 07/04/16 and rescheduled to 07/11/16. Per patient, date should be 07/04/16 and not 07/11/16. Called [facility] several times for clarification and was unsuccessful. Went back and spoke with Dr Benay Spice, which treatment date was confirmed for 07/04/16. Pump disconnect was rescheduled for 07/06/16.  Message sent to chemo scheduler to be added per Dr Benay Spice (verbal). Patient was given a copy of the AVS report and appointment schedule, per 06/27/16 los.

## 2016-06-29 ENCOUNTER — Telehealth: Payer: Self-pay | Admitting: *Deleted

## 2016-06-29 NOTE — Telephone Encounter (Signed)
Per 2/12 LOS and staff message I have scheduled appts. Notified the scheduler  

## 2016-06-30 ENCOUNTER — Telehealth: Payer: Self-pay | Admitting: Oncology

## 2016-06-30 NOTE — Telephone Encounter (Signed)
Called patient on both home and cell numbers, patient was unavailable and both voice mail boxes was full. Appointment dates was adjusted for 07/11/16 and 07/13/16, per amended disposition history/los for 06/27/16 visit. New appointment schedule was mailed to patient.

## 2016-07-01 ENCOUNTER — Encounter (HOSPITAL_COMMUNITY): Payer: Medicaid Other

## 2016-07-01 ENCOUNTER — Other Ambulatory Visit (HOSPITAL_COMMUNITY): Payer: Medicaid Other

## 2016-07-03 ENCOUNTER — Other Ambulatory Visit: Payer: Self-pay | Admitting: Nurse Practitioner

## 2016-07-03 DIAGNOSIS — C787 Secondary malignant neoplasm of liver and intrahepatic bile duct: Secondary | ICD-10-CM

## 2016-07-03 DIAGNOSIS — C189 Malignant neoplasm of colon, unspecified: Secondary | ICD-10-CM

## 2016-07-04 ENCOUNTER — Other Ambulatory Visit: Payer: Medicaid Other

## 2016-07-04 ENCOUNTER — Ambulatory Visit: Payer: Medicaid Other | Admitting: Oncology

## 2016-07-05 ENCOUNTER — Telehealth: Payer: Self-pay | Admitting: *Deleted

## 2016-07-05 NOTE — Telephone Encounter (Signed)
Message from pt's wife reporting he came in today for treatment and was told appointment was canceled.  Left message informing her that he is scheduled for Lab/Office and chemo next week.

## 2016-07-11 ENCOUNTER — Other Ambulatory Visit: Payer: Self-pay | Admitting: *Deleted

## 2016-07-11 ENCOUNTER — Telehealth: Payer: Self-pay | Admitting: Oncology

## 2016-07-11 ENCOUNTER — Ambulatory Visit (HOSPITAL_BASED_OUTPATIENT_CLINIC_OR_DEPARTMENT_OTHER): Payer: Medicaid Other

## 2016-07-11 ENCOUNTER — Ambulatory Visit (HOSPITAL_BASED_OUTPATIENT_CLINIC_OR_DEPARTMENT_OTHER): Payer: Medicaid Other | Admitting: Oncology

## 2016-07-11 ENCOUNTER — Ambulatory Visit: Payer: Medicaid Other

## 2016-07-11 ENCOUNTER — Other Ambulatory Visit (HOSPITAL_BASED_OUTPATIENT_CLINIC_OR_DEPARTMENT_OTHER): Payer: Medicaid Other

## 2016-07-11 VITALS — BP 146/84 | HR 96 | Temp 98.7°F | Resp 18 | Wt 256.7 lb

## 2016-07-11 DIAGNOSIS — I10 Essential (primary) hypertension: Secondary | ICD-10-CM | POA: Diagnosis not present

## 2016-07-11 DIAGNOSIS — C787 Secondary malignant neoplasm of liver and intrahepatic bile duct: Secondary | ICD-10-CM

## 2016-07-11 DIAGNOSIS — C187 Malignant neoplasm of sigmoid colon: Secondary | ICD-10-CM | POA: Diagnosis present

## 2016-07-11 DIAGNOSIS — C189 Malignant neoplasm of colon, unspecified: Secondary | ICD-10-CM

## 2016-07-11 DIAGNOSIS — Z5111 Encounter for antineoplastic chemotherapy: Secondary | ICD-10-CM

## 2016-07-11 DIAGNOSIS — F418 Other specified anxiety disorders: Secondary | ICD-10-CM

## 2016-07-11 DIAGNOSIS — R739 Hyperglycemia, unspecified: Secondary | ICD-10-CM

## 2016-07-11 DIAGNOSIS — Z95828 Presence of other vascular implants and grafts: Secondary | ICD-10-CM

## 2016-07-11 LAB — COMPREHENSIVE METABOLIC PANEL
ALT: 62 U/L — ABNORMAL HIGH (ref 0–55)
AST: 49 U/L — ABNORMAL HIGH (ref 5–34)
Albumin: 3.8 g/dL (ref 3.5–5.0)
Alkaline Phosphatase: 123 U/L (ref 40–150)
Anion Gap: 17 mEq/L — ABNORMAL HIGH (ref 3–11)
BUN: 7.7 mg/dL (ref 7.0–26.0)
CO2: 17 mEq/L — ABNORMAL LOW (ref 22–29)
Calcium: 9.3 mg/dL (ref 8.4–10.4)
Chloride: 100 mEq/L (ref 98–109)
Creatinine: 1.4 mg/dL — ABNORMAL HIGH (ref 0.7–1.3)
EGFR: 71 mL/min/{1.73_m2} — ABNORMAL LOW (ref >90)
Glucose: 388 mg/dl — ABNORMAL HIGH (ref 70–140)
Potassium: 4 mEq/L (ref 3.5–5.1)
Sodium: 134 mEq/L — ABNORMAL LOW (ref 136–145)
Total Bilirubin: 0.48 mg/dL (ref 0.20–1.20)
Total Protein: 7.9 g/dL (ref 6.4–8.3)

## 2016-07-11 LAB — CBC WITH DIFFERENTIAL/PLATELET
BASO%: 0.1 % (ref 0.0–2.0)
Basophils Absolute: 0 10*3/uL (ref 0.0–0.1)
EOS%: 4.3 % (ref 0.0–7.0)
Eosinophils Absolute: 0.3 10*3/uL (ref 0.0–0.5)
HCT: 35.9 % — ABNORMAL LOW (ref 38.4–49.9)
HGB: 11.7 g/dL — ABNORMAL LOW (ref 13.0–17.1)
LYMPH%: 39.6 % (ref 14.0–49.0)
MCH: 27.1 pg — ABNORMAL LOW (ref 27.2–33.4)
MCHC: 32.6 g/dL (ref 32.0–36.0)
MCV: 83.3 fL (ref 79.3–98.0)
MONO#: 0.5 10*3/uL (ref 0.1–0.9)
MONO%: 7.1 % (ref 0.0–14.0)
NEUT#: 3.3 10*3/uL (ref 1.5–6.5)
NEUT%: 48.9 % (ref 39.0–75.0)
Platelets: 75 10*3/uL — ABNORMAL LOW (ref 140–400)
RBC: 4.31 10*6/uL (ref 4.20–5.82)
RDW: 17.9 % — ABNORMAL HIGH (ref 11.0–14.6)
WBC: 6.7 10*3/uL (ref 4.0–10.3)
lymph#: 2.7 10*3/uL (ref 0.9–3.3)
nRBC: 0 % (ref 0–0)

## 2016-07-11 LAB — CEA (IN HOUSE-CHCC): CEA (CHCC-In House): 1.93 ng/mL (ref 0.00–5.00)

## 2016-07-11 MED ORDER — CAPECITABINE 500 MG PO TABS
ORAL_TABLET | ORAL | 0 refills | Status: DC
Start: 2016-08-01 — End: 2016-07-12

## 2016-07-11 MED ORDER — FLUOROURACIL CHEMO INJECTION 2.5 GM/50ML
400.0000 mg/m2 | Freq: Once | INTRAVENOUS | Status: AC
  Administered 2016-07-11: 950 mg via INTRAVENOUS
  Filled 2016-07-11: qty 19

## 2016-07-11 MED ORDER — DEXTROSE 5 % IV SOLN
Freq: Once | INTRAVENOUS | Status: AC
  Administered 2016-07-11: 13:00:00 via INTRAVENOUS

## 2016-07-11 MED ORDER — LEUCOVORIN CALCIUM INJECTION 350 MG
400.0000 mg/m2 | Freq: Once | INTRAMUSCULAR | Status: AC
  Administered 2016-07-11: 940 mg via INTRAVENOUS
  Filled 2016-07-11: qty 47

## 2016-07-11 MED ORDER — SODIUM CHLORIDE 0.9% FLUSH
10.0000 mL | INTRAVENOUS | Status: DC | PRN
  Administered 2016-07-11: 10 mL via INTRAVENOUS
  Filled 2016-07-11: qty 10

## 2016-07-11 MED ORDER — FLUOROURACIL CHEMO INJECTION 5 GM/100ML
2400.0000 mg/m2 | INTRAVENOUS | Status: DC
  Administered 2016-07-11: 5650 mg via INTRAVENOUS
  Filled 2016-07-11: qty 113

## 2016-07-11 MED ORDER — LEUCOVORIN CALCIUM INJECTION 350 MG: 400.0000 mg/m2 | Freq: Once | INTRAVENOUS | Status: DC

## 2016-07-11 NOTE — Patient Instructions (Signed)
[facility] Discharge Instructions for Patients Receiving Chemotherapy  Today you received the following chemotherapy agents, Leucovorin, 5FU Push and Pump  To help prevent nausea and vomiting after your treatment, we encourage you to take your nausea medication as directed.   If you develop nausea and vomiting that is not controlled by your nausea medication, call the clinic.   BELOW ARE SYMPTOMS THAT SHOULD BE REPORTED IMMEDIATELY:  *FEVER GREATER THAN 100.5 F  *CHILLS WITH OR WITHOUT FEVER  NAUSEA AND VOMITING THAT IS NOT CONTROLLED WITH YOUR NAUSEA MEDICATION  *UNUSUAL SHORTNESS OF BREATH  *UNUSUAL BRUISING OR BLEEDING  TENDERNESS IN MOUTH AND THROAT WITH OR WITHOUT PRESENCE OF ULCERS  *URINARY PROBLEMS  *BOWEL PROBLEMS  UNUSUAL RASH Items with * indicate a potential emergency and should be followed up as soon as possible.  Feel free to call the clinic you have any questions or concerns. The clinic phone number is [phone].  Please show the [place] at check-in to the Emergency Department and triage nurse.

## 2016-07-11 NOTE — Telephone Encounter (Signed)
Appointments scheduled per 07/11/16 los. Patient was given a copy of the AVS report and appointment schedule, per 07/11/16 los.

## 2016-07-11 NOTE — Progress Notes (Signed)
OK to treat today with platelet count of 75 per Dr. Benay Spice.

## 2016-07-11 NOTE — Patient Instructions (Signed)

## 2016-07-11 NOTE — Progress Notes (Signed)
  [facility] OFFICE PROGRESS NOTE   Diagnosis: Colon cancer  INTERVAL HISTORY:   Jeff [name] returns as scheduled. He reports feeling "normal ". The blood sugar remains elevated. He continues to have numbness in the hands and feet.  Objective:  Vital signs in last 24 hours:  Blood pressure (!) 146/84, pulse 96, temperature 98.7 F (37.1 C), temperature source Oral, resp. rate 18, weight 256 lb 11.2 oz (116.4 kg), SpO2 100 %.    HEENT: No thrush or ulcers Resp: Lungs clear bilaterally Cardio: Regular rate and rhythm GI: No hepatosplenomegaly, no mass, nontender Vascular: The left lower leg is slightly larger than the right side  Skin: Hyperpigmentation of the hands   Portacath/PICC-without erythema  Lab Results:  Lab Results  Component Value Date   WBC 6.7 07/11/2016   HGB 11.7 (L) 07/11/2016   HCT 35.9 (L) 07/11/2016   MCV 83.3 07/11/2016   PLT 75 (L) 07/11/2016   NEUTROABS 3.3 07/11/2016     Medications: I have reviewed the patient's current medications.  Assessment/Plan: 1. 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 CTschest, abdomen, and pelvis on 12/11/2015-indeterminate tiny pulmonary nodules, multiple liver metastases, small nodes in the sigmoid mesocolon ? Laparoscopic sigmoid colectomy, liver biopsy, Port-A-Cath placement 01/14/2016 ? Pathology sigmoid colon resection-colonic adenocarcinoma, 5.3 cm extending into pericolonic connective tissue, positive lymph-vascular invasion, positive perineural invasion, negative margins, metastatic carcinoma in9 of 28 lymph nodes ? Pathology liver biopsy-metastatic colorectal adenocarcinoma ? MSI stable; mismatch repair protein normal ? APC alteration, K RAS wild-type, no BRAF mutation ? Cycle 1 FOLFOX 02/02/2016 ? Cycle 2 FOLFOX 02/15/2016 ? Cycle 3 FOLFOX 02/29/2016 ? Cycle 4 FOLFOX 03/14/2016 ? Cycle 5 FOLFOX  03/28/2016 ? Cycle 6 FOLFOX 04/11/2016 (oxaliplatin held secondary to thrombocytopenia)  ? 04/21/2016 restaging CTs-difficulty evaluating liver lesions due to hepatic steatosis. Stable right adrenal nodule. No adenopathy or local recurrence near the rectosigmoid anastomotic site. ? Cycle 7 FOLFOX 04/25/2016 ? MRI liver 05/02/2016-partial improvement in hepatic metastases ? Cycle 8 FOLFOX 05/10/2016 ? Cycle 9 FOLFOX 05/23/2016 (oxaliplatin held due to thrombocytopenia) ? Cycle 10 FOLFOX 06/06/2016 (oxaliplatin dose reduced due to thrombocytopenia) ? Cycle 11 FOLFOX 06/27/2016 (oxaliplatin held due to neuropathy) ? Cycle 12 FOLFOX 07/11/2016 (oxaliplatin held)  2. Rectal bleeding and constipation secondary to #1  3. History of peripheral vascular disease, status post left lower extremity vascular bypass surgery in April 2017  4. History of nephrolithiasis  5. History of Graves' disease treated with radioactive iodine  6. Anxiety/depression  7. Hypertension  8. Hospitalization 01/19/2016 with wound dehiscence status post secondary suture closure of abdominal wall  9. 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 Hyperglycemia 06/20/2016-he appears to have new onset diabetes. Maintained on insulin.   Jeff [name] appears well. The plan is to complete another cycle of infusional 5-fluorouracil today. He will return for an office visit in 2 weeks with the plan to begin maintenance capecitabine. I reviewed the potential toxicities associated with capecitabine and he agrees to proceed. We will be sure he is following up with his primary physician for management of the diabetes.  25 minutes were spent with the patient today. The majority of the time was used for counseling and coordination of care.

## 2016-07-12 ENCOUNTER — Telehealth: Payer: Self-pay | Admitting: Pharmacist

## 2016-07-12 DIAGNOSIS — C187 Malignant neoplasm of sigmoid colon: Secondary | ICD-10-CM

## 2016-07-12 MED ORDER — CAPECITABINE 500 MG PO TABS: ORAL_TABLET | ORAL | 0 refills | Status: DC

## 2016-07-12 NOTE — Telephone Encounter (Signed)
Oral Chemotherapy Pharmacist Encounter  Received new prescription for Xeloda for patient for maintenance treatment of metastatic colon cancer Labs from 07/11/16 reviewed, OK for treatment, noted pltc=75k, ok for treatment Current medication list I Epic assessed, no significant DDIs with Xeloda identified  Prescription will be e-scribed to [facility] for benefits analysis, noted patient with Medicaid.  Oral Oncology Clinic will continue to follow.  Johny Drilling, PharmD, BCPS, BCOP 07/12/2016  3:16 PM Oral Oncology Clinic [phone]

## 2016-07-13 ENCOUNTER — Ambulatory Visit (HOSPITAL_BASED_OUTPATIENT_CLINIC_OR_DEPARTMENT_OTHER): Payer: Medicaid Other

## 2016-07-13 VITALS — BP 139/84 | HR 98 | Temp 98.2°F | Resp 18

## 2016-07-13 DIAGNOSIS — Z452 Encounter for adjustment and management of vascular access device: Secondary | ICD-10-CM | POA: Diagnosis not present

## 2016-07-13 DIAGNOSIS — C787 Secondary malignant neoplasm of liver and intrahepatic bile duct: Secondary | ICD-10-CM

## 2016-07-13 DIAGNOSIS — C187 Malignant neoplasm of sigmoid colon: Secondary | ICD-10-CM

## 2016-07-13 MED ORDER — SODIUM CHLORIDE 0.9% FLUSH
10.0000 mL | INTRAVENOUS | Status: DC | PRN
  Filled 2016-07-13: qty 10

## 2016-07-13 MED ORDER — HEPARIN SOD (PORK) LOCK FLUSH 100 UNIT/ML IV SOLN
500.0000 [IU] | Freq: Once | INTRAVENOUS | Status: DC | PRN
  Filled 2016-07-13: qty 5

## 2016-07-13 NOTE — Telephone Encounter (Signed)
Oral Chemotherapy Pharmacist Encounter  Received notification from [facility] that patient's Xeloda prescription has been processed and has copayment $3  I called patient and spoke to his wife, Jeff [name], to offer initial counseling and additional support. Jeff [name] states pharmacy called this morning to alert them prescription is ready for pick up.  Counseled Jeff [name] on administration, dosing, side effects, safe handling, and monitoring. Patient will wait until he sees Dr. Benay Spice on 07/26/16 before starting Xeloda. Patient will take 4 tablets (200mg  total) in the morning and 4 tablets (2000mg  total) in the evening, both doses taken within 30 minutes of a meal. Patient will continue this on a 1 week-on, 1 week-off schedule.  Side effects include but not limited to: fatigue, hand-foot syndrome, decreased blood counts, mucositis, N/V/D.  Jeff [name] voiced understanding and appreciation.   All questions answered. Jeff [name] knows to call the office with questions or concerns.  Jeff [name], PharmD, BCPS, BCOP 07/13/2016  11:44 AM Oral Oncology Clinic [phone]

## 2016-07-17 ENCOUNTER — Emergency Department (HOSPITAL_COMMUNITY): Payer: No Typology Code available for payment source

## 2016-07-17 ENCOUNTER — Emergency Department (HOSPITAL_COMMUNITY)
Admission: EM | Admit: 2016-07-17 | Discharge: 2016-07-17 | Disposition: A | Discharge status: Home / Self Care | Payer: No Typology Code available for payment source | Attending: Emergency Medicine | Admitting: Emergency Medicine

## 2016-07-17 ENCOUNTER — Encounter (HOSPITAL_COMMUNITY): Payer: Self-pay

## 2016-07-17 DIAGNOSIS — Y939 Activity, unspecified: Secondary | ICD-10-CM | POA: Insufficient documentation

## 2016-07-17 DIAGNOSIS — M25511 Pain in right shoulder: Secondary | ICD-10-CM | POA: Diagnosis present

## 2016-07-17 DIAGNOSIS — I252 Old myocardial infarction: Secondary | ICD-10-CM | POA: Diagnosis not present

## 2016-07-17 DIAGNOSIS — I10 Essential (primary) hypertension: Secondary | ICD-10-CM | POA: Insufficient documentation

## 2016-07-17 DIAGNOSIS — Y92410 Unspecified street and highway as the place of occurrence of the external cause: Secondary | ICD-10-CM | POA: Insufficient documentation

## 2016-07-17 DIAGNOSIS — J45909 Unspecified asthma, uncomplicated: Secondary | ICD-10-CM | POA: Insufficient documentation

## 2016-07-17 DIAGNOSIS — Z85038 Personal history of other malignant neoplasm of large intestine: Secondary | ICD-10-CM | POA: Insufficient documentation

## 2016-07-17 DIAGNOSIS — M542 Cervicalgia: Secondary | ICD-10-CM

## 2016-07-17 DIAGNOSIS — E039 Hypothyroidism, unspecified: Secondary | ICD-10-CM | POA: Insufficient documentation

## 2016-07-17 DIAGNOSIS — Y999 Unspecified external cause status: Secondary | ICD-10-CM | POA: Diagnosis not present

## 2016-07-17 MED ORDER — CYCLOBENZAPRINE HCL 10 MG PO TABS: 10.0000 mg | ORAL_TABLET | Freq: Two times a day (BID) | ORAL | 0 refills | Status: DC | PRN

## 2016-07-17 MED ORDER — ACETAMINOPHEN 325 MG PO TABS
650.0000 mg | ORAL_TABLET | Freq: Once | ORAL | Status: AC
  Administered 2016-07-17: 650 mg via ORAL
  Filled 2016-07-17: qty 2
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
## 2016-07-17 NOTE — Discharge Instructions (Signed)
Please read attached information. If you experience any new or worsening signs or symptoms please return to the emergency room for evaluation. Please follow-up with your primary care provider or specialist as discussed. Please use medication prescribed only as directed and discontinue taking if you have any concerning signs or symptoms.   °

## 2016-07-17 NOTE — ED Notes (Signed)
Bed: WTR9 Expected date:  Expected time:  Means of arrival:  Comments: 
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
## 2016-07-20 ENCOUNTER — Ambulatory Visit (HOSPITAL_COMMUNITY)
Admission: RE | Admit: 2016-07-20 | Discharge: 2016-07-20 | Disposition: A | Discharge status: Home / Self Care | Payer: Medicaid Other | Source: Ambulatory Visit | Attending: Chiropractic Medicine | Admitting: Chiropractic Medicine

## 2016-07-20 ENCOUNTER — Other Ambulatory Visit (HOSPITAL_COMMUNITY): Payer: Self-pay | Admitting: Chiropractic Medicine

## 2016-07-20 DIAGNOSIS — M542 Cervicalgia: Secondary | ICD-10-CM

## 2016-07-20 DIAGNOSIS — M545 Low back pain: Secondary | ICD-10-CM

## 2016-07-20 DIAGNOSIS — M546 Pain in thoracic spine: Secondary | ICD-10-CM

## 2016-07-20 DIAGNOSIS — Z9889 Other specified postprocedural states: Secondary | ICD-10-CM | POA: Diagnosis not present

## 2016-07-20 MED FILL — CAPECITABINE 500 MG TABLET: 500 | 28 days supply | Qty: 112 | Fill #0

## 2016-07-24 ENCOUNTER — Other Ambulatory Visit: Payer: Self-pay | Admitting: Oncology

## 2016-07-26 ENCOUNTER — Other Ambulatory Visit (HOSPITAL_BASED_OUTPATIENT_CLINIC_OR_DEPARTMENT_OTHER): Payer: Medicaid Other

## 2016-07-26 ENCOUNTER — Ambulatory Visit (HOSPITAL_BASED_OUTPATIENT_CLINIC_OR_DEPARTMENT_OTHER): Payer: Medicaid Other | Admitting: Nurse Practitioner

## 2016-07-26 ENCOUNTER — Other Ambulatory Visit: Payer: Medicaid Other

## 2016-07-26 ENCOUNTER — Ambulatory Visit: Payer: Medicaid Other

## 2016-07-26 ENCOUNTER — Telehealth: Payer: Self-pay | Admitting: Nurse Practitioner

## 2016-07-26 VITALS — BP 137/84 | HR 77 | Temp 98.1°F | Resp 18 | Ht 70.0 in | Wt 253.7 lb

## 2016-07-26 DIAGNOSIS — C187 Malignant neoplasm of sigmoid colon: Secondary | ICD-10-CM | POA: Diagnosis present

## 2016-07-26 DIAGNOSIS — R200 Anesthesia of skin: Secondary | ICD-10-CM | POA: Diagnosis not present

## 2016-07-26 DIAGNOSIS — R739 Hyperglycemia, unspecified: Secondary | ICD-10-CM

## 2016-07-26 DIAGNOSIS — I10 Essential (primary) hypertension: Secondary | ICD-10-CM | POA: Diagnosis not present

## 2016-07-26 DIAGNOSIS — F418 Other specified anxiety disorders: Secondary | ICD-10-CM

## 2016-07-26 DIAGNOSIS — Z95828 Presence of other vascular implants and grafts: Secondary | ICD-10-CM

## 2016-07-26 DIAGNOSIS — C787 Secondary malignant neoplasm of liver and intrahepatic bile duct: Secondary | ICD-10-CM

## 2016-07-26 DIAGNOSIS — C189 Malignant neoplasm of colon, unspecified: Secondary | ICD-10-CM

## 2016-07-26 LAB — CBC WITH DIFFERENTIAL/PLATELET
BASO%: 0.2 % (ref 0.0–2.0)
Basophils Absolute: 0 10*3/uL (ref 0.0–0.1)
EOS%: 5.1 % (ref 0.0–7.0)
Eosinophils Absolute: 0.3 10*3/uL (ref 0.0–0.5)
HCT: 37.9 % — ABNORMAL LOW (ref 38.4–49.9)
HGB: 12.1 g/dL — ABNORMAL LOW (ref 13.0–17.1)
LYMPH%: 43.5 % (ref 14.0–49.0)
MCH: 26.7 pg — ABNORMAL LOW (ref 27.2–33.4)
MCHC: 31.9 g/dL — ABNORMAL LOW (ref 32.0–36.0)
MCV: 83.8 fL (ref 79.3–98.0)
MONO#: 0.5 10*3/uL (ref 0.1–0.9)
MONO%: 8.2 % (ref 0.0–14.0)
NEUT#: 2.4 10*3/uL (ref 1.5–6.5)
NEUT%: 43 % (ref 39.0–75.0)
Platelets: 93 10*3/uL — ABNORMAL LOW (ref 140–400)
RBC: 4.53 10*6/uL (ref 4.20–5.82)
RDW: 18.4 % — ABNORMAL HIGH (ref 11.0–14.6)
WBC: 5.5 10*3/uL (ref 4.0–10.3)
lymph#: 2.4 10*3/uL (ref 0.9–3.3)

## 2016-07-26 LAB — COMPREHENSIVE METABOLIC PANEL
ALT: 45 U/L (ref 0–55)
AST: 41 U/L — ABNORMAL HIGH (ref 5–34)
Albumin: 3.9 g/dL (ref 3.5–5.0)
Alkaline Phosphatase: 109 U/L (ref 40–150)
Anion Gap: 14 mEq/L — ABNORMAL HIGH (ref 3–11)
BUN: 8.8 mg/dL (ref 7.0–26.0)
CO2: 22 mEq/L (ref 22–29)
Calcium: 9.5 mg/dL (ref 8.4–10.4)
Chloride: 104 mEq/L (ref 98–109)
Creatinine: 1.2 mg/dL (ref 0.7–1.3)
EGFR: 80 mL/min/{1.73_m2} — ABNORMAL LOW (ref >90)
Glucose: 171 mg/dl — ABNORMAL HIGH (ref 70–140)
Potassium: 3.6 mEq/L (ref 3.5–5.1)
Sodium: 139 mEq/L (ref 136–145)
Total Bilirubin: 0.65 mg/dL (ref 0.20–1.20)
Total Protein: 7.7 g/dL (ref 6.4–8.3)

## 2016-07-26 LAB — CEA (IN HOUSE-CHCC): CEA (CHCC-In House): 1.58 ng/mL (ref 0.00–5.00)

## 2016-07-26 MED ORDER — ALTEPLASE 2 MG IJ SOLR
2.0000 mg | Freq: Once | INTRAMUSCULAR | Status: DC | PRN
  Filled 2016-07-26: qty 2

## 2016-07-26 MED ORDER — SODIUM CHLORIDE 0.9% FLUSH
10.0000 mL | INTRAVENOUS | Status: DC | PRN
  Administered 2016-07-26: 10 mL via INTRAVENOUS
  Filled 2016-07-26: qty 10

## 2016-07-26 MED ORDER — HEPARIN SOD (PORK) LOCK FLUSH 100 UNIT/ML IV SOLN
500.0000 [IU] | Freq: Once | INTRAVENOUS | Status: AC | PRN
  Administered 2016-07-26: 500 [IU] via INTRAVENOUS
  Filled 2016-07-26: qty 5

## 2016-07-26 NOTE — Patient Instructions (Signed)
Implanted Port Home Guide An implanted port is a type of central line that is placed under the skin. Central lines are used to provide IV access when treatment or nutrition needs to be given through a person's veins. Implanted ports are used for long-term IV access. An implanted port may be placed because:  You need IV medicine that would be irritating to the small veins in your hands or arms.  You need long-term IV medicines, such as antibiotics.  You need IV nutrition for a long period.  You need frequent blood draws for lab tests.  You need dialysis.  Implanted ports are usually placed in the chest area, but they can also be placed in the upper arm, the abdomen, or the leg. An implanted port has two main parts:  Reservoir. The reservoir is round and will appear as a small, raised area under your skin. The reservoir is the part where a needle is inserted to give medicines or draw blood.  Catheter. The catheter is a thin, flexible tube that extends from the reservoir. The catheter is placed into a large vein. Medicine that is inserted into the reservoir goes into the catheter and then into the vein.  How will I care for my incision site? Do not get the incision site wet. Bathe or shower as directed by your health care provider. How is my port accessed? Special steps must be taken to access the port:  Before the port is accessed, a numbing cream can be placed on the skin. This helps numb the skin over the port site.  Your health care provider uses a sterile technique to access the port. ? Your health care provider must put on a mask and sterile gloves. ? The skin over your port is cleaned carefully with an antiseptic and allowed to dry. ? The port is gently pinched between sterile gloves, and a needle is inserted into the port.  Only "non-coring" port needles should be used to access the port. Once the port is accessed, a blood return should be checked. This helps ensure that the port  is in the vein and is not clogged.  If your port needs to remain accessed for a constant infusion, a clear (transparent) bandage will be placed over the needle site. The bandage and needle will need to be changed every week, or as directed by your health care provider.  Keep the bandage covering the needle clean and dry. Do not get it wet. Follow your health care provider's instructions on how to take a shower or bath while the port is accessed.  If your port does not need to stay accessed, no bandage is needed over the port.  What is flushing? Flushing helps keep the port from getting clogged. Follow your health care provider's instructions on how and when to flush the port. Ports are usually flushed with saline solution or a medicine called heparin. The need for flushing will depend on how the port is used.  If the port is used for intermittent medicines or blood draws, the port will need to be flushed: ? After medicines have been given. ? After blood has been drawn. ? As part of routine maintenance.  If a constant infusion is running, the port may not need to be flushed.  How long will my port stay implanted? The port can stay in for as long as your health care provider thinks it is needed. When it is time for the port to come out, surgery will be   done to remove it. The procedure is similar to the one performed when the port was put in. When should I seek immediate medical care? When you have an implanted port, you should seek immediate medical care if:  You notice a bad smell coming from the incision site.  You have swelling, redness, or drainage at the incision site.  You have more swelling or pain at the port site or the surrounding area.  You have a fever that is not controlled with medicine.  This information is not intended to replace advice given to you by your health care provider. Make sure you discuss any questions you have with your health care provider. Document  Released: 05/02/2005 Document Revised: 10/08/2015 Document Reviewed: 01/07/2013 Elsevier Interactive Patient Education  2017 [employer]  

## 2016-07-26 NOTE — Progress Notes (Signed)
[place] OFFICE PROGRESS NOTE   Diagnosis:  Colon cancer  INTERVAL HISTORY:   Jeff [name] returns as scheduled. He completed another cycle of 5-fluorouracil 07/11/2016. He continues to experience anticipatory nausea. No mouth sores. No diarrhea. No hand or foot pain or redness. He continues to have numbness in the hands and feet. He reports improved control of blood sugars. He is currently on metformin and insulin.  Objective:  Vital signs in last 24 hours:  Blood pressure 137/84, pulse 77, temperature 98.1 F (36.7 C), temperature source Axillary, resp. rate 18, height _0  (1.778 m), weight 253 lb 11.2 oz (115.1 kg), SpO2 100 %.    HEENT: No thrush or ulcers. Resp: Lungs clear bilaterally. Cardio: Regular rate and rhythm. GI: Abdomen soft and nontender. No hepatomegaly. Vascular: No leg edema. Left lower leg is slightly larger than the right lower leg. Skin: Palms without erythema. Port-A-Cath without erythema.    Lab Results:  Lab Results  Component Value Date   WBC 5.5 07/26/2016   HGB 12.1 (L) 07/26/2016   HCT 37.9 (L) 07/26/2016   MCV 83.8 07/26/2016   PLT 93 (L) 07/26/2016   NEUTROABS 2.4 07/26/2016    Imaging:  No results found.  Medications: I have reviewed the patient's current medications.  Assessment/Plan: 1. Sigmoid colon cancer, status post partially obstructing mass noted on endoscopy 12/08/2015, biopsy confirmed adenocarcinoma         2. CTschest, abdomen, and pelvis on 12/11/2015-indeterminate tiny pulmonary nodules, multiple liver metastases, small nodes in the sigmoid mesocolon  3. Laparoscopic sigmoid colectomy, liver biopsy, Port-A-Cath placement 01/14/2016  4. Pathology sigmoid colon resection-colonic adenocarcinoma, 5.3 cm extending into pericolonic connective tissue, positive lymph-vascular invasion, positive perineural invasion, negative margins, metastatic carcinoma in9 of 28 lymph nodes  5. Pathology liver  biopsy-metastatic colorectal adenocarcinoma  6. MSI stable; mismatch repair protein normal  7. 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 Cycle 1 FOLFOX 02/02/2016  9. Cycle 2 FOLFOX 02/15/2016  10. Cycle 3 FOLFOX 02/29/2016  11. Cycle 4 FOLFOX 03/14/2016  12. Cycle 5 FOLFOX 03/28/2016  13. Cycle 6 FOLFOX 04/11/2016 (oxaliplatin held secondary to thrombocytopenia)  14. 04/21/2016 restaging CTs-difficulty evaluating liver lesions due to hepatic steatosis. Stable right adrenal nodule. No adenopathy or local recurrence near the rectosigmoid anastomotic site.  15. Cycle 7 FOLFOX 04/25/2016  16. MRI liver 05/02/2016-partial improvement in hepatic metastases  17. Cycle 8 FOLFOX 05/10/2016  18. Cycle 9 FOLFOX 05/23/2016 (oxaliplatin held due to thrombocytopenia)  19. Cycle 10 FOLFOX 06/06/2016 (oxaliplatin dose reduced due to thrombocytopenia)  20. Cycle 11 FOLFOX 06/27/2016 (oxaliplatin held due to neuropathy)  21. Cycle 12 FOLFOX 07/11/2016 (oxaliplatin held) 22. Initiation of maintenance Xeloda 7 days on/7 days off 07/27/2016  2. Rectal bleeding and constipation secondary to #1  3. History of peripheral vascular disease, status post left lower extremity vascular bypass surgery in April 2017  4. History of nephrolithiasis  5. History of Graves' disease treated with radioactive iodine  6. Anxiety/depression  7. Hypertension  8. Hospitalization 01/19/2016 with wound dehiscence status post secondary suture closure of abdominal wall  9. 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 Hyperglycemia 06/20/2016-he appears to have new onset diabetes. Maintained on insulin.   Disposition: Jeff [name] appears stable. He completed cycle 12 FOLFOX 07/11/2016. Oxaliplatin was held due to neuropathy. CEA remains in normal range. The plan is to begin maintenance Xeloda on a 7 day on/7 day off schedule  07/27/2016. He  will return for a follow-up visit in 3 weeks. He will contact the office in the interim with any problems.    Ned Card ANP/GNP-BC   07/26/2016  10:09 AM

## 2016-07-26 NOTE — Telephone Encounter (Signed)
Gave patient AVS and calender per 07/26/2016 los. 

## 2016-08-08 ENCOUNTER — Encounter: Payer: Self-pay | Admitting: Physician Assistant

## 2016-08-08 ENCOUNTER — Ambulatory Visit (INDEPENDENT_AMBULATORY_CARE_PROVIDER_SITE_OTHER): Payer: Medicaid Other | Admitting: Physician Assistant

## 2016-08-08 VITALS — BP 126/94 | HR 82 | Ht 70.0 in | Wt 255.0 lb

## 2016-08-08 DIAGNOSIS — I739 Peripheral vascular disease, unspecified: Secondary | ICD-10-CM | POA: Diagnosis not present

## 2016-08-08 DIAGNOSIS — E119 Type 2 diabetes mellitus without complications: Secondary | ICD-10-CM | POA: Diagnosis not present

## 2016-08-08 DIAGNOSIS — C189 Malignant neoplasm of colon, unspecified: Secondary | ICD-10-CM | POA: Diagnosis not present

## 2016-08-08 DIAGNOSIS — E785 Hyperlipidemia, unspecified: Secondary | ICD-10-CM

## 2016-08-08 DIAGNOSIS — Z79899 Other long term (current) drug therapy: Secondary | ICD-10-CM

## 2016-08-08 DIAGNOSIS — I10 Essential (primary) hypertension: Secondary | ICD-10-CM | POA: Diagnosis not present

## 2016-08-08 DIAGNOSIS — IMO0001 Reserved for inherently not codable concepts without codable children: Secondary | ICD-10-CM

## 2016-08-08 DIAGNOSIS — C787 Secondary malignant neoplasm of liver and intrahepatic bile duct: Secondary | ICD-10-CM | POA: Diagnosis not present

## 2016-08-08 DIAGNOSIS — Z794 Long term (current) use of insulin: Secondary | ICD-10-CM

## 2016-08-08 DIAGNOSIS — E039 Hypothyroidism, unspecified: Secondary | ICD-10-CM

## 2016-08-08 MED ORDER — OMEGA-3-ACID ETHYL ESTERS 1 G PO CAPS: 1.0000 g | ORAL_CAPSULE | Freq: Two times a day (BID) | ORAL | 5 refills | Status: DC

## 2016-08-08 MED ORDER — ATORVASTATIN CALCIUM 80 MG PO TABS: 80.0000 mg | ORAL_TABLET | Freq: Every evening | ORAL | 3 refills | Status: DC

## 2016-08-08 NOTE — Progress Notes (Signed)
Cardiology Office Note    Date:  08/08/2016   ID:  Jeff [name], DOB Feb 03, 1966, MRN [id number]  PCP:  Charlynn Court, NP  Cardiologist:  Dr. Fletcher Anon (formerly Dr. Mare Ferrari)  Chief Complaint  Patient presents with  . Follow-up    no complaints today. Seen for Dr. Fletcher Anon    History of Present Illness:  Jeff [name] is a 51 y.o. male with PMH of PAD, depression, HTN, HLD, IDDM and hypothyroidism (Grave's disease). He has a known history of NSTEMI in January 2014 due to probable vasospasm. Cardiac catheterization in January 2014 showed no significant coronary artery disease. He has a known short occlusion of left popliteal artery. Attempted angioplasty of the left popliteal artery in January 2016 was not successful due to inability to cross the occlusion. On 09/11/2015, he had above knee to below knee bypass graft placed by Dr. Trula Slade. His claudication symptoms subsequently resolved. Last echocardiogram obtained on 01/05/2015 showed EF 60-65%, no regional wall motion abnormality. Unfortunately he was diagnosed with metastatic colon cancer. Originally, he was noted to have a partially obstructing mass noted on colonoscopy on 12/08/2015. Biopsy confirmed adenocarcinoma. CT scan of chest, abdomen and pelvis obtained 3 days later showed indeterminate tiny pulmonary nodule, multiple liver metastasis and a small nodules in sigmoid mesocolon. He underwent laparoscopic sigmoid colectomy and subsequent liver biopsy which confirmed metastatic colorectal adenocarcinoma. Port-A-Cath was placed on 01/14/2016. Based on recent oncology note, he has completed another course of chemotherapy in February.  He has otherwise been doing well from cardiac perspective. He denies any chest discomfort or shortness of breath. Based on recent laboratory finding, his hemoglobin A1c was greater than 10, he says his primary care physician is adjusting his insulin dose. His TSH was 55 as well, he is being followed by his  endocrinologist who may need to increase his thyroid medication. Furthermore, his total cholesterol was 250 and that his triglyceride was over 500. I will increase his Lipitor to 80 mg daily and add Lovaza. He can follow-up with cardiology service one in one year. During the meantime I will refer him to the lipid clinic.    Past Medical History:  Diagnosis Date  . Allergic rhinitis   . Anxiety   . Arthritis   . Asthma   . At risk for sleep apnea    STOP-BANG= 6       SENT TO PCP 01-22-2015  . Chronic back pain    "from the neck to the lower back"   . 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 Depression   . Dysthymic disorder   . ED (erectile dysfunction)   . GERD (gastroesophageal reflux disease)   . History of acute pyelonephritis    01-07-2015  . History of Graves' disease    vs  Multinodular  . History of hiatal hernia   . History of kidney stones   . History of non-ST elevation myocardial infarction (NSTEMI)    Jan 2014--  no CAD;  per notes probable coronary vasospasm  . History of panic attacks   . History of septic shock    01-07-2015--  ureterolithias/ pyelonephritis  . History of thyroid storm    Nov 2011  . Hyperlipidemia   . Hypertension   . Hypothyroidism following radioiodine therapy    RAI in Mar 2012---  followed by dr Loanne Drilling  . PAD (peripheral artery  disease) [facility]) cardiologist-  dr Fletcher Anon   a.  ABI (01/2014):  L 0.53; R 1.0 >> referred to PV  . Right ureteral stone   . Septic shock ([place]) 01/05/2015  . Sleep disturbance   . Thrombocytopenia ([place])     Past Surgical History:  Procedure Laterality Date  . ABDOMINAL AORTAGRAM N/A 02/19/2014   Procedure: ABDOMINAL Maxcine Ham;  Surgeon: Wellington Hampshire, MD;  Location: [place] CATH LAB;  Service: Cardiovascular;  Laterality: N/A;  . ANTERIOR CERVICAL DECOMP/DISCECTOMY FUSION  09-08-2010   C3 -4  . BACK  SURGERY     x5, LOWER X 3, UPPER NECK X 2  . CYSTOSCOPY W/ URETERAL STENT PLACEMENT Right 01/04/2015   Procedure: CYSTOSCOPY WITH RETROGRADE PYELOGRAM/URETERAL STENT PLACEMENT;  Surgeon: Franchot Gallo, MD;  Location: [place];  Service: Urology;  Laterality: Right;  . CYSTOSCOPY W/ URETERAL STENT REMOVAL Right 01/26/2015   Procedure: CYSTOSCOPY WITH STENT REMOVAL;  Surgeon: Franchot Gallo, MD;  Location[facility];  Service: Urology;  Laterality: Right;  . CYSTOSCOPY/URETEROSCOPY/HOLMIUM LASER/STENT PLACEMENT Right 01/26/2015   Procedure: CYSTOSCOPY/URETEROSCOPY RIGHT;  Surgeon: Franchot Gallo, MD;  Location: [facility];  Service: Urology;  Laterality: Right;  . FEMORAL-POPLITEAL BYPASS GRAFT Left 09/11/2015   Procedure: BYPASS GRAFT LEFT ABOVE KNEE TO BELOW KNEE POPLITEAL ARTERY WITH LEFT GREATER SAPHENOUS VEIN;  Surgeon: Serafina Mitchell, MD;  Location: [place];  Service: Vascular;  Laterality: Left;  . INCISIONAL HERNIA REPAIR N/A 01/19/2016   Procedure: HERNIA REPAIR INCISIONAL;  Surgeon: Leighton Ruff, MD;  Location: [facility];  Service: General;  Laterality: N/A;  . LAMINECTOMY AND MICRODISCECTOMY LUMBAR SPINE  03-26-2009   left L5 -- S1  . LAPAROSCOPIC SIGMOID COLECTOMY N/A 01/14/2016   Procedure: LAPAROSCOPIC SIGMOID COLECTOMY;  Surgeon: Leighton Ruff, MD;  Location: [facility];  Service: General;  Laterality: N/A;  . LEFT HEART CATHETERIZATION WITH CORONARY ANGIOGRAM N/A 06/12/2012   Procedure: LEFT HEART CATHETERIZATION WITH CORONARY ANGIOGRAM;  Surgeon: Josue Hector, MD;  Location: [facility] CATH LAB;  Service: Cardiovascular;  Laterality: N/A;   No sig. CAD/  normal LVF, ef 55-65%  . LIVER BIOPSY N/A 01/14/2016   Procedure: LIVER BIOPSY;  Surgeon: Leighton Ruff, MD;  Location: [facility];  Service: General;  Laterality: N/A;  . LOWER EXTREMITY ANGIOGRAM Left 05/28/2014   Procedure: LOWER EXTREMITY ANGIOGRAM;  Surgeon: Wellington Hampshire, MD;  Location: [place] CATH LAB;  Service:  Cardiovascular;  Laterality: Left;  Failed PTA CTO  . POPLITEAL ARTERY ANGIOPLASTY Left 05/28/2014    dr Fletcher Anon   Attempted and unsuccessful due to inability to cross the occlusionnotes   . PORTACATH PLACEMENT Right 01/14/2016   Procedure: INSERTION PORT-A-CATH;  Surgeon: Leighton Ruff, MD;  Location: [facility];  Service: General;  Laterality: Right;  . POSTERIOR CERVICAL FUSION/FORAMINOTOMY N/A 12/10/2012   Procedure: POSTERIOR CERVICAL FUSION/FORAMINOTOMY LEVEL 1;  Surgeon: Ophelia Charter, MD;  Location: [place] NEURO ORS;  Service: Neurosurgery;  Laterality: N/A;  Cervical three-four posterior cervical fusion with lateral mass screws  . POSTERIOR LUMBAR FUSION  11-11-2009;   07-15-2013   L5 -- S1;   L4-- S1  . SHOULDER ARTHROSCOPY Right 03-08-2004   debridement labral tear/  DCR/  acromioplasty  . TRANSTHORACIC ECHOCARDIOGRAM  01-05-2015   mild LVH/  ef 60-65%/  mild TR  . UMBILICAL HERNIA REPAIR  1980  . VEIN HARVEST Left 09/11/2015   Procedure: LEFT GREATER SAPHENOUS VEIN HARVEST;  Surgeon: Serafina Mitchell, MD;  Location: [place];  Service:  Vascular;  Laterality: Left;    Current Medications: Outpatient Medications Prior to Visit  Medication Sig Dispense Refill  . ACCU-CHEK SOFTCLIX LANCETS lancets USE  TID TO CHECK BLOOD SUGAR LEVELS  0  . amLODipine (NORVASC) 5 MG tablet Take 1 tablet (5 mg total) by mouth daily. 90 tablet 3  . aspirin EC 81 MG tablet Take 81 mg by mouth daily.    . capecitabine (XELODA) 500 MG tablet Take four tablets (2000 mg) every morning and four tablets (2000 mg) every evening with food. Take days 1-7 and days 15-21, every 28 days. 112 tablet 0  . cyclobenzaprine (FLEXERIL) 10 MG tablet Take 1 tablet (10 mg total) by mouth 2 (two) times daily as needed for muscle spasms. 20 tablet 0  . diazepam (VALIUM) 10 MG tablet Take 10 mg by mouth at bedtime as needed for anxiety.   1  . docusate sodium (COLACE) 100 MG capsule Take 100 mg by mouth 2 (two) times daily.    Marland Kitchen  levothyroxine (SYNTHROID, LEVOTHROID) 112 MCG tablet TAKE 1 TABLET BY MOUTH DAILY BEFORE BREAKFAST 90 tablet 0  . lidocaine-prilocaine (EMLA) cream Apply 1 application topically as needed. Apply to portacath site 1 hour prior to use 30 g 3  . LORazepam (ATIVAN) 0.5 MG tablet TAKE 1 TABLET BY MOUTH EVERY 8 HOURS AS NEEDED FOR SLEEP 30 tablet 0  . magic mouthwash SOLN Take 5 mLs by mouth 4 (four) times daily as needed for mouth pain. 240 mL 1  . meclizine (ANTIVERT) 25 MG tablet Take 25 mg by mouth 3 (three) times daily as needed.  1  . metFORMIN (GLUCOPHAGE) 500 MG tablet Take 500 mg by mouth daily with breakfast. 2 tablets with supper    . metoprolol (LOPRESSOR) 50 MG tablet Take 0.5 tablets (25 mg total) by mouth 2 (two) times daily. 90 tablet 3  . nitroGLYCERIN (NITROSTAT) 0.4 MG SL tablet PLACE 1 TABLET UNDER THE TONGUE EVERY 5 MINUTES AS NEEDED FOR CHEST PAIN. 25 tablet 0  . oxyCODONE-acetaminophen (PERCOCET) 10-325 MG tablet Take 1 tablet by mouth every 6 (six) hours as needed for pain (for back pain). 30 tablet 0  . pantoprazole (PROTONIX) 40 MG tablet Take 40 mg by mouth daily.   5  . potassium chloride SA (K-DUR,KLOR-CON) 20 MEQ tablet TAKE 1 TABLET BY MOUTH ONCE DAILY 90 tablet 0  . prochlorperazine (COMPAZINE) 10 MG tablet Take 1 tablet (10 mg total) by mouth every 6 (six) hours as needed for nausea or vomiting. 30 tablet 3  . promethazine (PHENERGAN) 12.5 MG tablet Take 12.5 mg by mouth every 6 (six) hours as needed.  3  . sildenafil (REVATIO) 20 MG tablet Take 20-100 mg by mouth daily as needed for erectile dysfunction.  11  . tamsulosin (FLOMAX) 0.4 MG CAPS capsule Take 0.4 mg by mouth every evening.     Marland Kitchen tiZANidine (ZANAFLEX) 4 MG tablet Take 4 mg by mouth 2 (two) times daily as needed for muscle spasms.   2  . venlafaxine XR (EFFEXOR-XR) 150 MG 24 hr capsule Take 225 mg by mouth daily.   5  . atorvastatin (LIPITOR) 40 MG tablet Take 40 mg by mouth every evening.     Marland Kitchen NOVOLOG MIX  70/30 FLEXPEN (70-30) 100 UNIT/ML FlexPen 35 units twice a day  0  . RELISTOR 150 MG TABS Take 450 mg by mouth daily.   2   Facility-Administered Medications Prior to Visit  Medication Dose Route Frequency Provider Last  Rate Last Dose  . alteplase (CATHFLO ACTIVASE) injection 2 mg  2 mg Intracatheter Once PRN Ladell Pier, MD      . sodium chloride flush (NS) 0.9 % injection 10 mL  10 mL Intravenous PRN Ladell Pier, MD   10 mL at 02/04/16 1431     Allergies:   Hydrocodone   Social History   Social History  . Marital status: Married    Spouse name: N/A  . Number of children: 5  . Years of education: N/A   Occupational History  . Disabled Unemployed   Social History Main Topics  . Smoking status: Never Smoker  . Smokeless tobacco: Never Used  . Alcohol use 0.0 oz/week     Comment: RARE  . Drug use: No  . Sexual activity: Not Asked   Other Topics Concern  . None   Social History Narrative   Married - wife works Haematologist @ [employer]   5 daughters   Regular exercise-no   Disabled to due back problem   12/04/2015        Family History:  The patient's family history includes Diabetes in his maternal grandmother; Edema in his father; Heart Problems in his father; Heart attack in his maternal grandfather; Heart disease in his maternal grandmother; Hypertension in his maternal grandmother; Lung cancer in his maternal grandmother; Thyroid disease in his mother.   ROS:   Please see the history of present illness.    ROS All other systems reviewed and are negative.   PHYSICAL EXAM:   VS:  BP (!) 126/94   Pulse 82   Ht 5\' 10"  (1.778 m)   Wt 255 lb (115.7 kg)   BMI 36.59 kg/m    GEN: Well nourished, well developed, in no acute distress  HEENT: normal  Neck: no JVD, carotid bruits, or masses Cardiac: RRR; no murmurs, rubs, or gallops,no edema  Respiratory:  clear to auscultation bilaterally, normal work of breathing GI: soft, nontender, nondistended, + BS MS:  no deformity or atrophy  Skin: warm and dry, no rash Neuro:  Alert and Oriented x 3, Strength and sensation are intact Psych: euthymic mood, full affect  Wt Readings from Last 3 Encounters:  08/08/16 255 lb (115.7 kg)  07/26/16 253 lb 11.2 oz (115.1 kg)  07/11/16 256 lb 11.2 oz (116.4 kg)      Studies/Labs Reviewed:   EKG:  EKG is ordered today.  The ekg ordered today demonstrates Normal sinus rhythm, no significant ST-T wave changes.  Recent Labs: 03/08/2016: TSH 4.36 07/26/2016: ALT 45; BUN 8.8; Creatinine 1.2; HGB 12.1; Platelets 93; Potassium 3.6; Sodium 139   Lipid Panel    Component Value Date/Time   CHOL 107 06/12/2012 0637   TRIG 93 06/12/2012 0637   HDL 43 06/12/2012 0637   CHOLHDL 2.5 06/12/2012 0637   VLDL 19 06/12/2012 0637   LDLCALC 45 06/12/2012 0637   LDLDIRECT 43 06/11/2012 2246    Additional studies/ records that were reviewed today include:     LE angiography 05/28/2014  Findings: No significant left SFA disease. Short occlusion of the left popliteal artery with high bifurcation of the anterior tibial and TP trunk. Collaterals noted.  Conclusions: Unsuccessful left popliteal artery angioplasty due to inability to cross the chronic occlusion in spite of using multiple wires and catheters.  Recommendations:  Consider retrograde pedal access and attempted angioplasty.    Echo 01/05/2015 LV EF: 60% -   65%  - Left ventricle: The cavity size  was normal. Wall thickness was   increased in a pattern of mild LVH. Systolic function was normal.   The estimated ejection fraction was in the range of 60% to 65%.   Wall motion was normal; there were no regional wall motion   abnormalities. Left ventricular diastolic function parameters   were normal.   ASSESSMENT:    1. Medication management   2. PAD (peripheral artery disease) ([place])   3. Essential hypertension   4. Hyperlipidemia, unspecified hyperlipidemia type   5. IDDM (insulin dependent  diabetes mellitus) ([place])   6. Hypothyroidism, unspecified type   7. Metastatic colon cancer to liver [facility])      PLAN:  In order of problems listed above:  1. PAD s/p above knee to below bypass: Followed by Dr. Trula Slade  2. Hypertension: Blood pressure well controlled on amlodipine 5 mg daily, metoprolol 25 mg 2 times a day  3. Hyperlipidemia: Severely uncontrolled triglyceride and moderately uncontrolled cholesterol on recent lab work. I will add Lovenox at 1 g twice a day. I will also increase Lipitor to 80 mg daily. He will be referred to our lipid clinic for follow-up. I recommended 2-3 month repeat fasting lipid panel.  4. IDDM: On insulin. Recent laboratory finding shows his hemoglobin A1c was greater than 10. His insulin dose has been adjusted by his primary care provider.  5. Hypothyroidism: Recent lab shows his TSH was greater than 55, he is currently on Synthroid, he has upcoming appointment with his endocrinologist.    Medication Adjustments/Labs and Tests Ordered: Current medicines are reviewed at length with the patient today.  Concerns regarding medicines are outlined above.  Medication changes, Labs and Tests ordered today are listed in the Patient Instructions below. Patient Instructions  Medication Instructions:  INCREASE ATORVASTATIN 80MG  DAILY START LOVAZA 1g TWICE DAILY   If you need a refill on your cardiac medications before your next appointment, please call your pharmacy.  Labwork: LFT AND FLP IN 2-3 MONTHS AT [facility] ON THE 1ST FLOOR  Follow-Up: Your physician wants you to follow-up in: [place] will receive a reminder letter in the mail two months in advance. If you don't receive a letter, please call our office January 2019 to schedule the MARCH 2019 follow-up appointment.   Special Instructions: MAKE SURE TO KEEP ALL YOUR SCHEDULED FOLLOW UP APPTS ESPECIALLY PCP     Thank you for choosing [facility] at  [facility]!!    Jeff Ey, PA-C [place], LPN     Hilbert Corrigan, [place]  08/08/2016 12:38 PM    [facility] [place], [place], [place]  [address] Phone: [phone]; Fax: [phone]

## 2016-08-08 NOTE — Patient Instructions (Signed)
Medication Instructions:  INCREASE ATORVASTATIN 80MG  DAILY START LOVAZA 1g TWICE DAILY   If you need a refill on your cardiac medications before your next appointment, please call your pharmacy.  Labwork: LFT AND FLP IN 2-3 MONTHS AT [facility] ON THE 1ST FLOOR  Follow-Up: Your physician wants you to follow-up in: [place] will receive a reminder letter in the mail two months in advance. If you don't receive a letter, please call our office January 2019 to schedule the MARCH 2019 follow-up appointment.   Special Instructions: MAKE SURE TO KEEP ALL YOUR SCHEDULED FOLLOW UP APPTS ESPECIALLY PCP     Thank you for choosing [facility] at [employer]!!    HAO MENG, PA-C [place], LPN

## 2016-08-09 ENCOUNTER — Ambulatory Visit: Payer: Medicaid Other | Admitting: Cardiovascular Disease

## 2016-08-10 ENCOUNTER — Encounter: Payer: Self-pay | Admitting: Endocrinology

## 2016-08-10 ENCOUNTER — Ambulatory Visit (INDEPENDENT_AMBULATORY_CARE_PROVIDER_SITE_OTHER): Payer: Medicaid Other | Admitting: Endocrinology

## 2016-08-10 VITALS — BP 132/86 | HR 85 | Ht 70.0 in | Wt 252.0 lb

## 2016-08-10 DIAGNOSIS — Z794 Long term (current) use of insulin: Secondary | ICD-10-CM | POA: Diagnosis not present

## 2016-08-10 DIAGNOSIS — E890 Postprocedural hypothyroidism: Secondary | ICD-10-CM

## 2016-08-10 DIAGNOSIS — E1142 Type 2 diabetes mellitus with diabetic polyneuropathy: Secondary | ICD-10-CM | POA: Diagnosis not present

## 2016-08-10 DIAGNOSIS — E119 Type 2 diabetes mellitus without complications: Secondary | ICD-10-CM | POA: Insufficient documentation

## 2016-08-10 LAB — TSH: TSH: 48.93 u[IU]/mL — ABNORMAL HIGH (ref 0.35–4.50)

## 2016-08-10 MED ORDER — LEVOTHYROXINE SODIUM 150 MCG PO TABS: 150.0000 ug | ORAL_TABLET | Freq: Every day | ORAL | 3 refills | Status: DC

## 2016-08-10 NOTE — Patient Instructions (Addendum)
I have sent a prescription to your pharmacy, to increase the thyroid pill. Please come back for a follow-up appointment in 1 month. good diet and exercise significantly improve the control of your diabetes.  please let me know if you wish to be referred to a dietician.  high blood sugar is very risky to your health.  you should see an eye doctor and dentist every year.  It is very important to get all recommended vaccinations.  Controlling your blood pressure and cholesterol drastically reduces the damage diabetes does to your body.  Those who smoke should quit.  Please discuss these with your doctor.  check your blood sugar twice a day.  vary the time of day when you check, between before the 3 meals, and at bedtime.  also check if you have symptoms of your blood sugar being too high or too low.  please keep a record of the readings and bring it to your next appointment here (or you can bring the meter itself).  You can write it on any piece of paper.  please call us sooner if your blood sugar goes below 70, or if you have a lot of readings over 200.  blood tests are requested for you today.  We'll let you know about the results.      Levothyroxine tablets What is this medicine? LEVOTHYROXINE (lee voe thye ROX een) is a thyroid hormone. This medicine can improve symptoms of thyroid deficiency such as slow speech, lack of energy, weight gain, hair loss, dry skin, and feeling cold. It also helps to treat goiter (an enlarged thyroid gland). It is also used to treat some kinds of thyroid cancer along with surgery and other medicines. This medicine may be used for other purposes; ask your health care provider or pharmacist if you have questions. COMMON BRAND NAME(S): Estre, Levo-T, Levothroid, Levoxyl, Synthroid, Thyro-Tabs, Unithroid What should I tell my health care provider before I take this medicine? They need to know if you have any of these conditions: -angina -blood clotting  problems -diabetes -dieting or on a weight loss program -fertility problems -heart disease -high levels of thyroid hormone -pituitary gland problem -previous heart attack -an unusual or allergic reaction to levothyroxine, thyroid hormones, other medicines, foods, dyes, or preservatives -pregnant or trying to get pregnant -breast-feeding How should I use this medicine? Take this medicine by mouth with plenty of water. It is best to take on an empty stomach, at least 30 minutes before or 2 hours after food. Follow the directions on the prescription label. Take at the same time each day. Do not take your medicine more often than directed. Contact your pediatrician regarding the use of this medicine in children. While this drug may be prescribed for children and infants as young as a few days of age for selected conditions, precautions do apply. For infants, you may crush the tablet and place in a small amount of (5-10 ml or 1 to 2 teaspoonfuls) of water, breast milk, or non-soy based infant formula. Do not mix with soy-based infant formula. Give as directed. Overdosage: If you think you have taken too much of this medicine contact a poison control center or emergency room at once. NOTE: This medicine is only for you. Do not share this medicine with others. What if I miss a dose? If you miss a dose, take it as soon as you can. If it is almost time for your next dose, take only that dose. Do not take double or  extra doses. What may interact with this medicine? -amiodarone -antacids -anti-thyroid medicines -calcium supplements -carbamazepine -cholestyramine -colestipol -digoxin -male hormones, including contraceptive or birth control pills -iron supplements -ketamine -liquid nutrition products like Ensure -medicines for colds and breathing difficulties -medicines for diabetes -medicines for mental depression -medicines or herbals used to decrease weight or appetite -phenobarbital or  other barbiturate medications -phenytoin -prednisone or other corticosteroids -rifabutin -rifampin -soy isoflavones -sucralfate -theophylline -warfarin This list may not describe all possible interactions. Give your health care provider a list of all the medicines, herbs, non-prescription drugs, or dietary supplements you use. Also tell them if you smoke, drink alcohol, or use illegal drugs. Some items may interact with your medicine. What should I watch for while using this medicine? Be sure to take this medicine with plenty of fluids. Some tablets may cause choking, gagging, or difficulty swallowing from the tablet getting stuck in your throat. Most of these problems disappear if the medicine is taken with the right amount of water or other fluids. Do not switch brands of this medicine unless your health care professional agrees with the change. Ask questions if you are uncertain. You will need regular exams and occasional blood tests to check the response to treatment. If you are receiving this medicine for an underactive thyroid, it may be several weeks before you notice an improvement. Check with your doctor or health care professional if your symptoms do not improve. It may be necessary for you to take this medicine for the rest of your life. Do not stop using this medicine unless your doctor or health care professional advises you to. This medicine can affect blood sugar levels. If you have diabetes, check your blood sugar as directed. You may lose some of your hair when you first start treatment. With time, this usually corrects itself. If you are going to have surgery, tell your doctor or health care professional that you are taking this medicine. What side effects may I notice from receiving this medicine? Side effects that you should report to your doctor or health care professional as soon as possible: -allergic reactions like skin rash, itching or hives, swelling of the face, lips,  or tongue -chest pain -excessive sweating or intolerance to heat -fast or irregular heartbeat -nervousness -skin rash or hives -swelling of ankles, feet, or legs -tremors Side effects that usually do not require medical attention (report to your doctor or health care professional if they continue or are bothersome): -changes in appetite -changes in menstrual periods -diarrhea -hair loss -headache -trouble sleeping -weight loss This list may not describe all possible side effects. Call your doctor for medical advice about side effects. You may report side effects to FDA at 1-800-FDA-1088. Where should I keep my medicine? Keep out of the reach of children. Store at room temperature between 15 and 30 degrees C (59 and 86 degrees F). Protect from light and moisture. Keep container tightly closed. Throw away any unused medicine after the expiration date. NOTE: This sheet is a summary. It may not cover all possible information. If you have questions about this medicine, talk to your doctor, pharmacist, or health care provider.  2018 [employer] (2008-08-08 14:28:07)

## 2016-08-10 NOTE — Progress Notes (Signed)
Subjective:    Patient ID: Jeff [name], male    DOB: 06-01-1965, 51 y.o.   MRN: [id number]  HPI Pt returns for f/u of post-RAI hypothyroidism (in 2012, he had RAI rx for hyperthyroidism, due to Grave's Dz vs multinodular goiter vs both).  pt states he feels well in general (no chemo this week), and he takes synthroid as rx'ed.  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  He last took steroids 2 mos ago. He takes metformin and bid novolog.  He says cbg's are in the low-100's.   Past Medical History:  Diagnosis Date  . Allergic rhinitis   . Anxiety   . Arthritis   . Asthma   . At risk for sleep apnea    STOP-BANG= 6       SENT TO PCP 01-22-2015  . Chronic back pain    "from the neck to the lower back"   . 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 Depression   . Dysthymic disorder   . ED (erectile dysfunction)   . GERD (gastroesophageal reflux disease)   . History of acute pyelonephritis    01-07-2015  . History of Graves' disease    vs  Multinodular  . History of hiatal hernia   . History of kidney stones   . History of non-ST elevation myocardial infarction (NSTEMI)    Jan 2014--  no CAD;  per notes probable coronary vasospasm  . History of panic attacks   . History of septic shock    01-07-2015--  ureterolithias/ pyelonephritis  . History of thyroid storm    Nov 2011  . Hyperlipidemia   . Hypertension   . Hypothyroidism following radioiodine therapy    RAI in Mar 2012---  followed by dr Loanne Drilling  . PAD (peripheral artery disease) [facility]) cardiologist-  dr Fletcher Anon   a.  ABI (01/2014):  L 0.53; R 1.0 >> referred to PV  . Right  ureteral stone   . Septic shock ([place]) 01/05/2015  . Sleep disturbance   . Thrombocytopenia ([place])     Past Surgical History:  Procedure Laterality Date  . ABDOMINAL AORTAGRAM N/A 02/19/2014   Procedure: ABDOMINAL Maxcine Ham;  Surgeon: Wellington Hampshire, MD;  Location: [place] CATH LAB;  Service: Cardiovascular;  Laterality: N/A;  . ANTERIOR CERVICAL DECOMP/DISCECTOMY FUSION  09-08-2010   C3 -4  . BACK SURGERY     x5, LOWER X 3, UPPER NECK X 2  . CYSTOSCOPY W/ URETERAL STENT PLACEMENT Right 01/04/2015   Procedure: CYSTOSCOPY WITH RETROGRADE PYELOGRAM/URETERAL STENT PLACEMENT;  Surgeon: Franchot Gallo, MD;  Location: [place];  Service: Urology;  Laterality: Right;  . CYSTOSCOPY W/ URETERAL STENT REMOVAL Right 01/26/2015   Procedure: CYSTOSCOPY WITH STENT REMOVAL;  Surgeon: Franchot Gallo, MD;  Location: [facility];  Service: Urology;  Laterality: Right;  . CYSTOSCOPY/URETEROSCOPY/HOLMIUM LASER/STENT PLACEMENT Right 01/26/2015   Procedure: CYSTOSCOPY/URETEROSCOPY RIGHT;  Surgeon: Franchot Gallo, MD;  Location: [facility];  Service: Urology;  Laterality: Right;  . FEMORAL-POPLITEAL BYPASS GRAFT Left 09/11/2015   Procedure: BYPASS GRAFT LEFT ABOVE KNEE TO BELOW KNEE POPLITEAL ARTERY WITH LEFT GREATER SAPHENOUS  VEIN;  Surgeon: Serafina Mitchell, MD;  Location: [place];  Service: Vascular;  Laterality: Left;  . INCISIONAL HERNIA REPAIR N/A 01/19/2016   Procedure: HERNIA REPAIR INCISIONAL;  Surgeon: Leighton Ruff, MD;  Location: [facility];  Service: General;  Laterality: N/A;  . LAMINECTOMY AND MICRODISCECTOMY LUMBAR SPINE  03-26-2009   left L5 -- S1  . LAPAROSCOPIC SIGMOID COLECTOMY N/A 01/14/2016   Procedure: LAPAROSCOPIC SIGMOID COLECTOMY;  Surgeon: Leighton Ruff, MD;  Location: [facility];  Service: General;  Laterality: N/A;  . LEFT HEART CATHETERIZATION WITH CORONARY ANGIOGRAM N/A 06/12/2012   Procedure: LEFT HEART CATHETERIZATION WITH CORONARY ANGIOGRAM;  Surgeon: Josue Hector, MD;  Location: [facility] CATH LAB;  Service: Cardiovascular;  Laterality: N/A;   No sig. CAD/  normal LVF, ef 55-65%  . LIVER BIOPSY N/A 01/14/2016   Procedure: LIVER BIOPSY;  Surgeon: Leighton Ruff, MD;  Location: [facility];  Service: General;  Laterality: N/A;  . LOWER EXTREMITY ANGIOGRAM Left 05/28/2014   Procedure: LOWER EXTREMITY ANGIOGRAM;  Surgeon: Wellington Hampshire, MD;  Location: [place] CATH LAB;  Service: Cardiovascular;  Laterality: Left;  Failed PTA CTO  . POPLITEAL ARTERY ANGIOPLASTY Left 05/28/2014    dr Fletcher Anon   Attempted and unsuccessful due to inability to cross the occlusionnotes   . PORTACATH PLACEMENT Right 01/14/2016   Procedure: INSERTION PORT-A-CATH;  Surgeon: Leighton Ruff, MD;  Location: [facility];  Service: General;  Laterality: Right;  . POSTERIOR CERVICAL FUSION/FORAMINOTOMY N/A 12/10/2012   Procedure: POSTERIOR CERVICAL FUSION/FORAMINOTOMY LEVEL 1;  Surgeon: Ophelia Charter, MD;  Location: [place] [facility];  Service: Neurosurgery;  Laterality: N/A;  Cervical three-four posterior cervical fusion with lateral mass screws  . POSTERIOR LUMBAR FUSION  11-11-2009;   07-15-2013   L5 -- S1;   L4-- S1  . SHOULDER ARTHROSCOPY Right 03-08-2004   debridement labral tear/  DCR/  acromioplasty  . TRANSTHORACIC ECHOCARDIOGRAM  01-05-2015   mild LVH/  ef 60-65%/  mild TR  . UMBILICAL HERNIA REPAIR  1980  . VEIN HARVEST Left 09/11/2015   Procedure: LEFT GREATER SAPHENOUS VEIN HARVEST;  Surgeon: Serafina Mitchell, MD;  Location: [facility];  Service: Vascular;  Laterality: Left;    Social History   Social History  . Marital status: Married    Spouse name: N/A  . Number of children: 5  . Years of education: N/A   Occupational History  . Disabled Unemployed   Social History Main Topics  . Smoking status: Never Smoker  . Smokeless tobacco: Never Used  . Alcohol use 0.0 oz/week     Comment: RARE  . Drug use: No  . Sexual activity: Not on file   Other Topics Concern  . Not on file   Social  History Narrative   Married - wife works Surveyor, quantity unit @ [employer]   5 daughters   Regular exercise-no   Disabled to due back problem   12/04/2015       Current Outpatient Prescriptions on File Prior to Visit  Medication Sig Dispense Refill  . ACCU-CHEK SOFTCLIX LANCETS lancets USE  TID TO CHECK BLOOD SUGAR LEVELS  0  . amLODipine (NORVASC) 5 MG tablet Take 1 tablet (5 mg total) by mouth daily. 90 tablet 3  . aspirin EC 81 MG tablet Take 81 mg by mouth daily.    Marland Kitchen atorvastatin (LIPITOR) 80 MG tablet Take 1 tablet (80 mg total) by mouth every evening. 90 tablet 3  . capecitabine (XELODA) 500 MG tablet Take four tablets (  2000 mg) every morning and four tablets (2000 mg) every evening with food. Take days 1-7 and days 15-21, every 28 days. 112 tablet 0  . cyclobenzaprine (FLEXERIL) 10 MG tablet Take 1 tablet (10 mg total) by mouth 2 (two) times daily as needed for muscle spasms. 20 tablet 0  . diazepam (VALIUM) 10 MG tablet Take 10 mg by mouth at bedtime as needed for anxiety.   1  . docusate sodium (COLACE) 100 MG capsule Take 100 mg by mouth 2 (two) times daily.    . insulin aspart (NOVOLOG) 100 UNIT/ML injection Inject 45 Units into the skin 2 (two) times daily.    Marland Kitchen lidocaine-prilocaine (EMLA) cream Apply 1 application topically as needed. Apply to portacath site 1 hour prior to use 30 g 3  . LORazepam (ATIVAN) 0.5 MG tablet TAKE 1 TABLET BY MOUTH EVERY 8 HOURS AS NEEDED FOR SLEEP 30 tablet 0  . magic mouthwash SOLN Take 5 mLs by mouth 4 (four) times daily as needed for mouth pain. 240 mL 1  . meclizine (ANTIVERT) 25 MG tablet Take 25 mg by mouth 3 (three) times daily as needed.  1  . metFORMIN (GLUCOPHAGE) 500 MG tablet Take 500 mg by mouth daily with breakfast. 2 tablets with supper    . metoprolol (LOPRESSOR) 50 MG tablet Take 0.5 tablets (25 mg total) by mouth 2 (two) times daily. 90 tablet 3  . nitroGLYCERIN (NITROSTAT) 0.4 MG SL tablet PLACE 1 TABLET UNDER THE TONGUE EVERY 5 MINUTES AS  NEEDED FOR CHEST PAIN. 25 tablet 0  . omega-3 acid ethyl esters (LOVAZA) 1 g capsule Take 1 capsule (1 g total) by mouth 2 (two) times daily. 60 capsule 5  . oxyCODONE-acetaminophen (PERCOCET) 10-325 MG tablet Take 1 tablet by mouth every 6 (six) hours as needed for pain (for back pain). 30 tablet 0  . pantoprazole (PROTONIX) 40 MG tablet Take 40 mg by mouth daily.   5  . potassium chloride SA (K-DUR,KLOR-CON) 20 MEQ tablet TAKE 1 TABLET BY MOUTH ONCE DAILY 90 tablet 0  . prochlorperazine (COMPAZINE) 10 MG tablet Take 1 tablet (10 mg total) by mouth every 6 (six) hours as needed for nausea or vomiting. 30 tablet 3  . promethazine (PHENERGAN) 12.5 MG tablet Take 12.5 mg by mouth every 6 (six) hours as needed.  3  . sildenafil (REVATIO) 20 MG tablet Take 20-100 mg by mouth daily as needed for erectile dysfunction.  11  . tamsulosin (FLOMAX) 0.4 MG CAPS capsule Take 0.4 mg by mouth every evening.     Marland Kitchen tiZANidine (ZANAFLEX) 4 MG tablet Take 4 mg by mouth 2 (two) times daily as needed for muscle spasms.   2  . venlafaxine XR (EFFEXOR-XR) 150 MG 24 hr capsule Take 225 mg by mouth daily.   5   Current Facility-Administered Medications on File Prior to Visit  Medication Dose Route Frequency Provider Last Rate Last Dose  . alteplase (CATHFLO ACTIVASE) injection 2 mg  2 mg Intracatheter Once PRN Ladell Pier, MD      . sodium chloride flush (NS) 0.9 % injection 10 mL  10 mL Intravenous PRN Ladell Pier, MD   10 mL at 02/04/16 1431    Allergies  Allergen Reactions  . Hydrocodone Hives    Family History  Problem Relation Age of Onset  . Thyroid disease Mother     hypothyroidism  . Heart attack Maternal Grandfather   . Heart Problems Father  pacermaker  . Edema Father   . Heart disease Maternal Grandmother   . Lung cancer Maternal Grandmother   . Diabetes Maternal Grandmother   . Hypertension Maternal Grandmother     BP 132/86   Pulse 85   Ht 5\' 10"  (1.778 m)   Wt 252 lb (114.3  kg)   SpO2 95%   BMI 36.16 kg/m    Review of Systems denies weight loss, headache, chest pain, sob, n/v, urinary frequency, muscle cramps, excessive diaphoresis, depression, cold intolerance, rhinorrhea, and easy bruising.  He has chronically blurry vision.      Objective:   Physical Exam VS: see vs page GEN: no distress HEAD: head: no deformity eyes: no periorbital swelling, no proptosis external nose and ears are normal mouth: no lesion seen NECK: supple, thyroid is not enlarged CHEST WALL: no deformity LUNGS: clear to auscultation CV: reg rate and rhythm, no murmur ABD: abdomen is soft, nontender.  no hepatosplenomegaly.  not distended.  no hernia MUSCULOSKELETAL: muscle bulk and strength are grossly normal.  no obvious joint swelling.  gait is normal and steady EXTEMITIES: no deformity.  no ulcer on the feet.  feet are of normal color and temp.  no edema PULSES: dorsalis pedis intact bilat.  no carotid bruit NEURO:  cn 2-12 grossly intact.   readily moves all 4's.  sensation is intact to touch on the feet.  SKIN:  Normal texture and temperature.  No rash or suspicious lesion is visible.   NODES:  None palpable at the neck PSYCH: alert, well-oriented.  Does not appear anxious nor depressed.  I personally reviewed electrocardiogram tracing (08/08/16): Indication: PAD Impression: NSR.  No MI.  No hypertrophy. Compared to 2016: no significant change.  outside test results are reviewed: a1c=10.7% TSH=56    Assessment & Plan:  Insulin-requiring type 2 DM: severe exacerbation.  Hypothyroidism: worse.  Blurry vision, new to me: ref opthal.  Stage 4 colon cancer: steroid rx complicates the rx of DM.  I'll work around that.   Patient is advised the following: Patient Instructions  I have sent a prescription to your pharmacy, to increase the thyroid pill. Please come back for a follow-up appointment in 1 month. good diet and exercise significantly improve the control of your  diabetes.  please let me know if you wish to be referred to a dietician.  high blood sugar is very risky to your health.  you should see an eye doctor and dentist every year.  It is very important to get all recommended vaccinations.  Controlling your blood pressure and cholesterol drastically reduces the damage diabetes does to your body.  Those who smoke should quit.  Please discuss these with your doctor.  check your blood sugar twice a day.  vary the time of day when you check, between before the 3 meals, and at bedtime.  also check if you have symptoms of your blood sugar being too high or too low.  please keep a record of the readings and bring it to your next appointment here (or you can bring the meter itself).  You can write it on any piece of paper.  please call us sooner if your blood sugar goes below 70, or if you have a lot of readings over 200. blood tests are requested for you today.  We'll let you know about the results.      Levothyroxine tablets What is this medicine? LEVOTHYROXINE (lee voe thye ROX een) is a thyroid hormone. This medicine can  improve symptoms of thyroid deficiency such as slow speech, lack of energy, weight gain, hair loss, dry skin, and feeling cold. It also helps to treat goiter (an enlarged thyroid gland). It is also used to treat some kinds of thyroid cancer along with surgery and other medicines. This medicine may be used for other purposes; ask your health care provider or pharmacist if you have questions. COMMON BRAND NAME(S): Estre, Levo-T, Levothroid, Levoxyl, Synthroid, Thyro-Tabs, Unithroid What should I tell my health care provider before I take this medicine? They need to know if you have any of these conditions: -angina -blood clotting problems -diabetes -dieting or on a weight loss program -fertility problems -heart disease -high levels of thyroid hormone -pituitary gland problem -previous heart attack -an unusual or allergic reaction to  levothyroxine, thyroid hormones, other medicines, foods, dyes, or preservatives -pregnant or trying to get pregnant -breast-feeding How should I use this medicine? Take this medicine by mouth with plenty of water. It is best to take on an empty stomach, at least 30 minutes before or 2 hours after food. Follow the directions on the prescription label. Take at the same time each day. Do not take your medicine more often than directed. Contact your pediatrician regarding the use of this medicine in children. While this drug may be prescribed for children and infants as young as a few days of age for selected conditions, precautions do apply. For infants, you may crush the tablet and place in a small amount of (5-10 ml or 1 to 2 teaspoonfuls) of water, breast milk, or non-soy based infant formula. Do not mix with soy-based infant formula. Give as directed. Overdosage: If you think you have taken too much of this medicine contact a poison control center or emergency room at once. NOTE: This medicine is only for you. Do not share this medicine with others. What if I miss a dose? If you miss a dose, take it as soon as you can. If it is almost time for your next dose, take only that dose. Do not take double or extra doses. What may interact with this medicine? -amiodarone -antacids -anti-thyroid medicines -calcium supplements -carbamazepine -cholestyramine -colestipol -digoxin -male hormones, including contraceptive or birth control pills -iron supplements -ketamine -liquid nutrition products like Ensure -medicines for colds and breathing difficulties -medicines for diabetes -medicines for mental depression -medicines or herbals used to decrease weight or appetite -phenobarbital or other barbiturate medications -phenytoin -prednisone or other corticosteroids -rifabutin -rifampin -soy isoflavones -sucralfate -theophylline -warfarin This list may not describe all possible interactions.  Give your health care provider a list of all the medicines, herbs, non-prescription drugs, or dietary supplements you use. Also tell them if you smoke, drink alcohol, or use illegal drugs. Some items may interact with your medicine. What should I watch for while using this medicine? Be sure to take this medicine with plenty of fluids. Some tablets may cause choking, gagging, or difficulty swallowing from the tablet getting stuck in your throat. Most of these problems disappear if the medicine is taken with the right amount of water or other fluids. Do not switch brands of this medicine unless your health care professional agrees with the change. Ask questions if you are uncertain. You will need regular exams and occasional blood tests to check the response to treatment. If you are receiving this medicine for an underactive thyroid, it may be several weeks before you notice an improvement. Check with your doctor or health care professional if your symptoms do not  improve. It may be necessary for you to take this medicine for the rest of your life. Do not stop using this medicine unless your doctor or health care professional advises you to. This medicine can affect blood sugar levels. If you have diabetes, check your blood sugar as directed. You may lose some of your hair when you first start treatment. With time, this usually corrects itself. If you are going to have surgery, tell your doctor or health care professional that you are taking this medicine. What side effects may I notice from receiving this medicine? Side effects that you should report to your doctor or health care professional as soon as possible: -allergic reactions like skin rash, itching or hives, swelling of the face, lips, or tongue -chest pain -excessive sweating or intolerance to heat -fast or irregular heartbeat -nervousness -skin rash or hives -swelling of ankles, feet, or legs -tremors Side effects that usually do not  require medical attention (report to your doctor or health care professional if they continue or are bothersome): -changes in appetite -changes in menstrual periods -diarrhea -hair loss -headache -trouble sleeping -weight loss This list may not describe all possible side effects. Call your doctor for medical advice about side effects. You may report side effects to FDA at 1-800-FDA-1088. Where should I keep my medicine? Keep out of the reach of children. Store at room temperature between 15 and 30 degrees C (59 and 86 degrees F). Protect from light and moisture. Keep container tightly closed. Throw away any unused medicine after the expiration date. NOTE: This sheet is a summary. It may not cover all possible information. If you have questions about this medicine, talk to your doctor, pharmacist, or health care provider.  2018 Elsevier/Gold Standard (2008-08-08 14:28:07)

## 2016-08-12 ENCOUNTER — Other Ambulatory Visit: Payer: Self-pay | Admitting: Endocrinology

## 2016-08-12 LAB — FRUCTOSAMINE: Fructosamine: 374 umol/L — ABNORMAL HIGH (ref 190–270)

## 2016-08-12 MED ORDER — INSULIN ASPART 100 UNIT/ML ~~LOC~~ SOLN: 55.0000 [IU] | Freq: Two times a day (BID) | SUBCUTANEOUS | 11 refills | Status: DC

## 2016-08-16 ENCOUNTER — Telehealth: Payer: Self-pay | Admitting: Oncology

## 2016-08-16 ENCOUNTER — Ambulatory Visit (HOSPITAL_BASED_OUTPATIENT_CLINIC_OR_DEPARTMENT_OTHER): Payer: Medicaid Other | Admitting: Oncology

## 2016-08-16 ENCOUNTER — Other Ambulatory Visit: Payer: Self-pay | Admitting: *Deleted

## 2016-08-16 ENCOUNTER — Other Ambulatory Visit (HOSPITAL_BASED_OUTPATIENT_CLINIC_OR_DEPARTMENT_OTHER): Payer: Medicaid Other

## 2016-08-16 VITALS — BP 136/93 | HR 95 | Temp 97.9°F | Resp 18 | Ht 70.0 in | Wt 253.6 lb

## 2016-08-16 DIAGNOSIS — R739 Hyperglycemia, unspecified: Secondary | ICD-10-CM

## 2016-08-16 DIAGNOSIS — C189 Malignant neoplasm of colon, unspecified: Secondary | ICD-10-CM

## 2016-08-16 DIAGNOSIS — C187 Malignant neoplasm of sigmoid colon: Secondary | ICD-10-CM

## 2016-08-16 DIAGNOSIS — I10 Essential (primary) hypertension: Secondary | ICD-10-CM

## 2016-08-16 DIAGNOSIS — C787 Secondary malignant neoplasm of liver and intrahepatic bile duct: Secondary | ICD-10-CM

## 2016-08-16 DIAGNOSIS — F418 Other specified anxiety disorders: Secondary | ICD-10-CM

## 2016-08-16 LAB — COMPREHENSIVE METABOLIC PANEL
ALT: 74 U/L — ABNORMAL HIGH (ref 0–55)
AST: 61 U/L — ABNORMAL HIGH (ref 5–34)
Albumin: 4.1 g/dL (ref 3.5–5.0)
Alkaline Phosphatase: 101 U/L (ref 40–150)
Anion Gap: 14 mEq/L — ABNORMAL HIGH (ref 3–11)
BUN: 8.9 mg/dL (ref 7.0–26.0)
CO2: 21 mEq/L — ABNORMAL LOW (ref 22–29)
Calcium: 9.1 mg/dL (ref 8.4–10.4)
Chloride: 104 mEq/L (ref 98–109)
Creatinine: 1.1 mg/dL (ref 0.7–1.3)
EGFR: 89 mL/min/{1.73_m2} — ABNORMAL LOW (ref >90)
Glucose: 113 mg/dl (ref 70–140)
Potassium: 3.3 mEq/L — ABNORMAL LOW (ref 3.5–5.1)
Sodium: 139 mEq/L (ref 136–145)
Total Bilirubin: 0.65 mg/dL (ref 0.20–1.20)
Total Protein: 7.9 g/dL (ref 6.4–8.3)

## 2016-08-16 LAB — CBC WITH DIFFERENTIAL/PLATELET
BASO%: 0.2 % (ref 0.0–2.0)
Basophils Absolute: 0 10*3/uL (ref 0.0–0.1)
EOS%: 3.3 % (ref 0.0–7.0)
Eosinophils Absolute: 0.2 10*3/uL (ref 0.0–0.5)
HCT: 39.2 % (ref 38.4–49.9)
HGB: 12.8 g/dL — ABNORMAL LOW (ref 13.0–17.1)
LYMPH%: 35.1 % (ref 14.0–49.0)
MCH: 27.7 pg (ref 27.2–33.4)
MCHC: 32.7 g/dL (ref 32.0–36.0)
MCV: 84.7 fL (ref 79.3–98.0)
MONO#: 0.5 10*3/uL (ref 0.1–0.9)
MONO%: 7.3 % (ref 0.0–14.0)
NEUT#: 4 10*3/uL (ref 1.5–6.5)
NEUT%: 54.1 % (ref 39.0–75.0)
Platelets: 105 10*3/uL — ABNORMAL LOW (ref 140–400)
RBC: 4.63 10*6/uL (ref 4.20–5.82)
RDW: 18.3 % — ABNORMAL HIGH (ref 11.0–14.6)
WBC: 7.3 10*3/uL (ref 4.0–10.3)
lymph#: 2.6 10*3/uL (ref 0.9–3.3)

## 2016-08-16 LAB — CEA (IN HOUSE-CHCC): CEA (CHCC-In House): 1.26 ng/mL (ref 0.00–5.00)

## 2016-08-16 MED ORDER — CAPECITABINE 500 MG PO TABS: ORAL_TABLET | ORAL | 0 refills | Status: DC

## 2016-08-16 MED FILL — CAPECITABINE 500 MG TABLET: 500 | 28 days supply | Qty: 112 | Fill #0

## 2016-08-16 NOTE — Progress Notes (Signed)
[facility] OFFICE PROGRESS NOTE   Diagnosis: Colon cancer  INTERVAL HISTORY:   [name] returns as scheduled. He feels well. He continues to have neuropathy symptoms. He began Xeloda on 07/27/2016. No mouth sores, diarrhea, or hand/foot pain. His blood sugar is under better control.  Objective:  Vital signs in last 24 hours:  Blood pressure (!) 136/93, pulse 95, temperature 97.9 F (36.6 C), temperature source Oral, resp. rate 18, height 5' 10" (1.778 m), weight 253 lb 9.6 oz (115 kg), SpO2 100 %.    HEENT: No thrush or ulcers Resp: Lungs clear bilaterally Cardio: Regular rate and rhythm GI: No hepatosplenomegaly, no mass, nontender Vascular: The left lower leg is larger than the right side, no edema  Skin: Hyperpigmentation of the hands, no erythema or skin breakdown   Portacath/PICC-without erythema  Lab Results:  Lab Results  Component Value Date   WBC 7.3 08/16/2016   HGB 12.8 (L) 08/16/2016   HCT 39.2 08/16/2016   MCV 84.7 08/16/2016   PLT 105 (L) 08/16/2016   NEUTROABS 4.0 08/16/2016   07/26/2016: CEA-1.58  Medications: I have reviewed the patient's current medications.  Assessment/Plan: 1. Sigmoid colon cancer, status post partially obstructing mass noted on endoscopy 12/08/2015, biopsy confirmed adenocarcinoma         2. CTschest, abdomen, and pelvis on 12/11/2015-indeterminate tiny pulmonary nodules, multiple liver metastases, small nodes in the sigmoid mesocolon  3. Laparoscopic sigmoid colectomy, liver biopsy, Port-A-Cath placement 01/14/2016  4. Pathology sigmoid colon resection-colonic adenocarcinoma, 5.3 cm extending into pericolonic connective tissue, positive lymph-vascular invasion, positive perineural invasion, negative margins, metastatic carcinoma in9 of 28 lymph nodes  5. Pathology liver biopsy-metastatic colorectal adenocarcinoma  6. MSI stable; mismatch repair protein normal  7. 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 Cycle 1 FOLFOX 02/02/2016  9. Cycle 2 FOLFOX 02/15/2016  10. Cycle 3 FOLFOX 02/29/2016  11. Cycle 4 FOLFOX 03/14/2016  12. Cycle 5 FOLFOX 03/28/2016  13. Cycle 6 FOLFOX 04/11/2016 (oxaliplatin held secondary to thrombocytopenia)  14. 04/21/2016 restaging CTs-difficulty evaluating liver lesions due to hepatic steatosis. Stable right adrenal nodule. No adenopathy or local recurrence near the rectosigmoid anastomotic site.  15. Cycle 7 FOLFOX 04/25/2016  16. MRI liver 05/02/2016-partial improvement in hepatic metastases  17. Cycle 8 FOLFOX 05/10/2016  18. Cycle 9 FOLFOX 05/23/2016 (oxaliplatin held due to thrombocytopenia)  19. Cycle 10 FOLFOX 06/06/2016 (oxaliplatin dose reduced due to thrombocytopenia)  20. Cycle 11 FOLFOX 06/27/2016 (oxaliplatin held due to neuropathy)  21. Cycle 12 FOLFOX 07/11/2016 (oxaliplatin held) 22. Initiation of maintenance Xeloda 7 days on/7 days off 07/27/2016  2. Rectal bleeding and constipation secondary to #1  3. History of peripheral vascular disease, status post left lower extremity vascular bypass surgery in April 2017  4. History of nephrolithiasis  5. History of Graves' disease treated with radioactive iodine  6. Anxiety/depression  7. Hypertension  8. Hospitalization 01/19/2016 with wound dehiscence status post secondary suture closure of abdominal wall  9. 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 Hyperglycemia 06/20/2016-he appears to have new onset diabetes. Maintained on insulin.    Disposition:  [name] appears to be tolerating the Xeloda well. He will continue Xeloda on a 7 day on/7 day off schedule. He will return for an office visit and Port-A-Cath flush in 3 weeks.  The CEA remains in the normal range. We will plan for a follow-up MRI of the liver at a six-month interval.    15 minutes were spent with the patient today. The majority of  the time was used for counseling and coordination of care.   Betsy Coder, MD  08/16/2016  9:04 AM

## 2016-08-16 NOTE — Telephone Encounter (Signed)
Gave patient AVS and calender per 08/16/2016 los.  

## 2016-08-17 ENCOUNTER — Telehealth: Payer: Self-pay | Admitting: Endocrinology

## 2016-08-17 NOTE — Telephone Encounter (Signed)
I contacted the patient and left a voicemail advising the patient his insulin was changed to novolog after his last office visit. Patient advised to call back if he had any further questions.

## 2016-08-17 NOTE — Telephone Encounter (Signed)
[facility] called and said that the patient came to pick up his insulin but wasn't aware that Dr. Loanne Drilling was changing his medication.  He said that he was on another insulin pen before, but it was not Novolog.  They are calling to verify that this is correct.

## 2016-08-18 ENCOUNTER — Telehealth: Payer: Self-pay | Admitting: Endocrinology

## 2016-08-18 MED ORDER — INSULIN ASPART 100 UNIT/ML FLEXPEN: PEN_INJECTOR | SUBCUTANEOUS | 5 refills | Status: DC

## 2016-08-18 MED FILL — NOVOLOG FLEXPEN SYRINGE: 100 | 28 days supply | Qty: 30 | Fill #0

## 2016-08-18 NOTE — Telephone Encounter (Signed)
Rx submitted

## 2016-08-18 NOTE — Telephone Encounter (Signed)
Pt requests that we send in the Novolog Pens instead of Vials to [facility]

## 2016-08-23 ENCOUNTER — Telehealth: Payer: Self-pay | Admitting: Endocrinology

## 2016-08-23 NOTE — Telephone Encounter (Signed)
novolog was increased wife is just wondering why?

## 2016-08-23 NOTE — Telephone Encounter (Signed)
I contacted the patient's wife and advised on 08/10/2016 the patient had a fructosamine blood test done and the a1c calculated to a 8.0. I advised this was a little high and we needed to increase the Novolog to 55 unit two times per day. Patient's wife voiced understanding and had no further questions at this time.

## 2016-08-29 ENCOUNTER — Telehealth: Payer: Self-pay

## 2016-08-29 NOTE — Telephone Encounter (Signed)
I called [employer] and did a PA for Omega-3 over the phone with Rosendo Gros. Per Annie Main has been approved . Approval is good from 08/23/2016 until 08/23/2017. PA# [id number].  [facility], the pts pharmacy, is aware.

## 2016-09-05 ENCOUNTER — Other Ambulatory Visit: Payer: Self-pay | Admitting: *Deleted

## 2016-09-06 ENCOUNTER — Other Ambulatory Visit (HOSPITAL_BASED_OUTPATIENT_CLINIC_OR_DEPARTMENT_OTHER): Payer: Medicaid Other

## 2016-09-06 ENCOUNTER — Other Ambulatory Visit: Payer: Self-pay

## 2016-09-06 ENCOUNTER — Ambulatory Visit: Payer: Medicaid Other

## 2016-09-06 ENCOUNTER — Other Ambulatory Visit: Payer: Self-pay | Admitting: *Deleted

## 2016-09-06 ENCOUNTER — Telehealth: Payer: Self-pay | Admitting: Oncology

## 2016-09-06 ENCOUNTER — Ambulatory Visit (HOSPITAL_BASED_OUTPATIENT_CLINIC_OR_DEPARTMENT_OTHER): Payer: Medicaid Other | Admitting: Nurse Practitioner

## 2016-09-06 VITALS — BP 129/79 | HR 85 | Temp 98.3°F | Resp 17 | Ht 70.0 in | Wt 253.1 lb

## 2016-09-06 DIAGNOSIS — C787 Secondary malignant neoplasm of liver and intrahepatic bile duct: Secondary | ICD-10-CM

## 2016-09-06 DIAGNOSIS — C187 Malignant neoplasm of sigmoid colon: Secondary | ICD-10-CM

## 2016-09-06 DIAGNOSIS — F418 Other specified anxiety disorders: Secondary | ICD-10-CM | POA: Diagnosis not present

## 2016-09-06 DIAGNOSIS — I10 Essential (primary) hypertension: Secondary | ICD-10-CM | POA: Diagnosis not present

## 2016-09-06 DIAGNOSIS — R911 Solitary pulmonary nodule: Secondary | ICD-10-CM | POA: Diagnosis not present

## 2016-09-06 DIAGNOSIS — R739 Hyperglycemia, unspecified: Secondary | ICD-10-CM

## 2016-09-06 DIAGNOSIS — C189 Malignant neoplasm of colon, unspecified: Secondary | ICD-10-CM

## 2016-09-06 LAB — CBC WITH DIFFERENTIAL/PLATELET
BASO%: 0.1 % (ref 0.0–2.0)
Basophils Absolute: 0 10*3/uL (ref 0.0–0.1)
EOS%: 4 % (ref 0.0–7.0)
Eosinophils Absolute: 0.3 10*3/uL (ref 0.0–0.5)
HCT: 38.7 % (ref 38.4–49.9)
HGB: 12.5 g/dL — ABNORMAL LOW (ref 13.0–17.1)
LYMPH%: 33.7 % (ref 14.0–49.0)
MCH: 28 pg (ref 27.2–33.4)
MCHC: 32.2 g/dL (ref 32.0–36.0)
MCV: 87 fL (ref 79.3–98.0)
MONO#: 0.6 10*3/uL (ref 0.1–0.9)
MONO%: 9.2 % (ref 0.0–14.0)
NEUT#: 3.6 10*3/uL (ref 1.5–6.5)
NEUT%: 53 % (ref 39.0–75.0)
Platelets: 132 10*3/uL — ABNORMAL LOW (ref 140–400)
RBC: 4.45 10*6/uL (ref 4.20–5.82)
RDW: 21 % — ABNORMAL HIGH (ref 11.0–14.6)
WBC: 6.8 10*3/uL (ref 4.0–10.3)
lymph#: 2.3 10*3/uL (ref 0.9–3.3)

## 2016-09-06 LAB — COMPREHENSIVE METABOLIC PANEL
ALT: 87 U/L — ABNORMAL HIGH (ref 0–55)
AST: 65 U/L — ABNORMAL HIGH (ref 5–34)
Albumin: 3.8 g/dL (ref 3.5–5.0)
Alkaline Phosphatase: 84 U/L (ref 40–150)
Anion Gap: 12 mEq/L — ABNORMAL HIGH (ref 3–11)
BUN: 7.3 mg/dL (ref 7.0–26.0)
CO2: 24 mEq/L (ref 22–29)
Calcium: 9 mg/dL (ref 8.4–10.4)
Chloride: 106 mEq/L (ref 98–109)
Creatinine: 1.1 mg/dL (ref 0.7–1.3)
EGFR: >90 mL/min/{1.73_m2} (ref >90)
Glucose: 119 mg/dl (ref 70–140)
Potassium: 3.8 mEq/L (ref 3.5–5.1)
Sodium: 141 mEq/L (ref 136–145)
Total Bilirubin: 0.6 mg/dL (ref 0.20–1.20)
Total Protein: 7.5 g/dL (ref 6.4–8.3)

## 2016-09-06 MED ORDER — SODIUM CHLORIDE 0.9 % IJ SOLN
10.0000 mL | Freq: Once | INTRAMUSCULAR | Status: AC
  Administered 2016-09-06: 10 mL
  Filled 2016-09-06: qty 10

## 2016-09-06 MED ORDER — HEPARIN SOD (PORK) LOCK FLUSH 100 UNIT/ML IV SOLN
500.0000 [IU] | Freq: Once | INTRAVENOUS | Status: AC
  Administered 2016-09-06: 500 [IU]
  Filled 2016-09-06: qty 5

## 2016-09-06 MED ORDER — CAPECITABINE 500 MG PO TABS: ORAL_TABLET | ORAL | 0 refills | Status: DC

## 2016-09-06 MED FILL — CAPECITABINE 500 MG TABLET: 500 | 28 days supply | Qty: 112 | Fill #0

## 2016-09-06 NOTE — Telephone Encounter (Signed)
Appointments scheduled per 09/06/16 los. Patient was given a copy of the AVS report and appointment schedule per 09/06/16 los. °

## 2016-09-06 NOTE — Progress Notes (Signed)
  Jeff [name] OFFICE PROGRESS NOTE   Diagnosis:  Colon cancer  INTERVAL HISTORY:   Jeff [name] returns as scheduled. He continues every other week Xeloda. He denies nausea/vomiting. No mouth sores. No diarrhea. No hand or foot pain or redness. He continues to have neuropathy symptoms. He denies abdominal pain. He has a good appetite. He reports improved control of blood sugars.  Objective:  Vital signs in last 24 hours:  Blood pressure 129/79, pulse 85, temperature 98.3 F (36.8 C), temperature source Oral, resp. rate 17, height '5\' 10"'$  (1.778 m), weight 253 lb 1.6 oz (114.8 kg), SpO2 100 %.    HEENT: No thrush or ulcers. Resp: Lungs clear bilaterally. Cardio: Regular rate and rhythm. GI: Abdomen soft and nontender. No hepatomegaly. Vascular: No leg edema. Skin: Palms without erythema. Port-A-Cath without erythema.  Lab Results:  Lab Results  Component Value Date   WBC 6.8 09/06/2016   HGB 12.5 (L) 09/06/2016   HCT 38.7 09/06/2016   MCV 87.0 09/06/2016   PLT 132 (L) 09/06/2016   NEUTROABS 3.6 09/06/2016    Imaging:  No results found.  Medications: I have reviewed the patient's current medications.  Assessment/Plan: 1. 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 CTschest, abdomen, and pelvis on 12/11/2015-indeterminate tiny pulmonary nodules, multiple liver metastases, small nodes in the sigmoid mesocolon  3. Laparoscopic sigmoid colectomy, liver biopsy, Port-A-Cath placement 01/14/2016  4. Pathology sigmoid colon resection-colonic adenocarcinoma, 5.3 cm extending into pericolonic connective tissue, positive lymph-vascular invasion, positive perineural invasion, negative margins, metastatic carcinoma in9 of 28 lymph nodes  5. Pathology liver biopsy-metastatic colorectal adenocarcinoma  6. MSI stable; mismatch repair protein normal  7. APC alteration, K RAS  wild-type, no BRAF mutation  8. Cycle 1 FOLFOX 02/02/2016  9. Cycle 2 FOLFOX 02/15/2016  10. Cycle 3 FOLFOX 02/29/2016  11. Cycle 4 FOLFOX 03/14/2016  12. Cycle 5 FOLFOX 03/28/2016  13. Cycle 6 FOLFOX 04/11/2016 (oxaliplatin held secondary to thrombocytopenia)  14. 04/21/2016 restaging CTs-difficulty evaluating liver lesions due to hepatic steatosis. Stable right adrenal nodule. No adenopathy or local recurrence near the rectosigmoid anastomotic site.  15. Cycle 7 FOLFOX 04/25/2016  16. MRI liver 05/02/2016-partial improvement in hepatic metastases  17. Cycle 8 FOLFOX 05/10/2016  18. Cycle 9 FOLFOX 05/23/2016 (oxaliplatin held due to thrombocytopenia)  19. Cycle 10 FOLFOX 06/06/2016 (oxaliplatin dose reduced due to thrombocytopenia)  20. Cycle 11 FOLFOX 06/27/2016 (oxaliplatin held due to neuropathy)  21. Cycle 12 FOLFOX 07/11/2016 (oxaliplatin held) 22. Initiation of maintenance Xeloda 7 days on/7 days off 07/27/2016  2. Rectal bleeding and constipation secondary to #1  3. History of peripheral vascular disease, status post left lower extremity vascular bypass surgery in April 2017  4. History of nephrolithiasis  5. History of Graves' disease treated with radioactive iodine  6. Anxiety/depression  7. Hypertension  8. Hospitalization 01/19/2016 with wound dehiscence status post secondary suture closure of abdominal wall  9. 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 Hyperglycemia 06/20/2016-he appears to have new onset diabetes. Maintained on insulin.    Disposition: Jeff [name] appears well. There is no clinical evidence of disease progression. He will continue Xeloda 7 days on/7 days off. We will see him in follow-up in 3 weeks. He will contact the office in the interim with any problems.    Ned Card ANP/GNP-BC   09/06/2016  9:48 AM

## 2016-09-09 ENCOUNTER — Ambulatory Visit: Payer: Medicaid Other | Admitting: Endocrinology

## 2016-09-19 ENCOUNTER — Encounter: Payer: Self-pay | Admitting: Endocrinology

## 2016-09-19 ENCOUNTER — Ambulatory Visit (INDEPENDENT_AMBULATORY_CARE_PROVIDER_SITE_OTHER): Payer: Medicaid Other | Admitting: Endocrinology

## 2016-09-19 ENCOUNTER — Other Ambulatory Visit: Payer: Self-pay

## 2016-09-19 VITALS — BP 132/82 | HR 76 | Ht 70.0 in | Wt 254.0 lb

## 2016-09-19 DIAGNOSIS — Z794 Long term (current) use of insulin: Secondary | ICD-10-CM | POA: Diagnosis not present

## 2016-09-19 DIAGNOSIS — E890 Postprocedural hypothyroidism: Secondary | ICD-10-CM

## 2016-09-19 DIAGNOSIS — E1142 Type 2 diabetes mellitus with diabetic polyneuropathy: Secondary | ICD-10-CM | POA: Diagnosis not present

## 2016-09-19 LAB — TSH: TSH: 0.16 u[IU]/mL — ABNORMAL LOW (ref 0.35–4.50)

## 2016-09-19 LAB — POCT GLYCOSYLATED HEMOGLOBIN (HGB A1C): Hemoglobin A1C: 6.8

## 2016-09-19 MED ORDER — INSULIN ASPART 100 UNIT/ML FLEXPEN: PEN_INJECTOR | SUBCUTANEOUS | 5 refills | Status: DC

## 2016-09-19 MED ORDER — INSULIN ASPART 100 UNIT/ML FLEXPEN: 50.0000 [IU] | PEN_INJECTOR | Freq: Two times a day (BID) | SUBCUTANEOUS | 5 refills | Status: DC

## 2016-09-19 MED ORDER — LEVOTHYROXINE SODIUM 137 MCG PO TABS: 137.0000 ug | ORAL_TABLET | Freq: Every day | ORAL | 11 refills | Status: DC

## 2016-09-19 NOTE — Patient Instructions (Addendum)
Please decrease thje insulin to 50 units twice a day (breakfast and supper) check your blood sugar twice a day.  vary the time of day when you check, between before the 3 meals, and at bedtime.  also check if you have symptoms of your blood sugar being too high or too low.  please keep a record of the readings and bring it to your next appointment here (or you can bring the meter itself).  You can write it on any piece of paper.  please call us sooner if your blood sugar goes below 70, or if you have a lot of readings over 200.  blood tests are requested for you today.  We'll let you know about the results. Please come back for a follow-up appointment in 4 months.

## 2016-09-19 NOTE — Progress Notes (Signed)
Subjective:    Patient ID: Jeff [name], male    DOB: January 27, 1966, 51 y.o.   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 Interval history: Pt says he skips the insulin when cbg is below 100.  This happens approx once per week.  no cbg record, but states cbg's vary from 98-141.  There is no trend throughout the day.  No recent steroids.  Pt also has post-RAI hypothyroidism (in 2012, he had RAI rx for hyperthyroidism, due to Grave's Dz vs multinodular goiter vs both).  pt states he feels well in general (no chemo this week), and he takes synthroid as rx'ed.  Past Medical History:  Diagnosis Date  . Allergic rhinitis   . Anxiety   . Arthritis   . Asthma   . At risk for sleep apnea    STOP-BANG= 6       SENT TO PCP 01-22-2015  . Chronic back pain    "from the neck to the lower back"   . 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 Depression   . Dysthymic disorder   . ED (erectile dysfunction)   . GERD (gastroesophageal reflux disease)   . History of acute pyelonephritis    01-07-2015  . History of Graves' disease    vs  Multinodular  . History of hiatal hernia   . History of kidney stones   . History of non-ST elevation myocardial infarction (NSTEMI)    Jan 2014--  no CAD;  per notes probable coronary vasospasm  . History of panic attacks   . History of septic shock    01-07-2015--  ureterolithias/ pyelonephritis  . History of thyroid storm    Nov 2011  . Hyperlipidemia   . Hypertension   . Hypothyroidism  following radioiodine therapy    RAI in Mar 2012---  followed by dr Loanne Drilling  . PAD (peripheral artery disease) [facility]) cardiologist-  dr Fletcher Anon   a.  ABI (01/2014):  L 0.53; R 1.0 >> referred to [facility]  . Right ureteral stone   . Septic shock ([place]) 01/05/2015  . Sleep disturbance   . Thrombocytopenia ([place])     Past Surgical History:  Procedure Laterality Date  . ABDOMINAL AORTAGRAM N/A 02/19/2014   Procedure: ABDOMINAL Maxcine Ham;  Surgeon: Wellington Hampshire, MD;  Location: [place] CATH LAB;  Service: Cardiovascular;  Laterality: N/A;  . ANTERIOR CERVICAL DECOMP/DISCECTOMY FUSION  09-08-2010   C3 -4  . BACK SURGERY     x5, LOWER X 3, UPPER NECK X 2  . CYSTOSCOPY W/ URETERAL STENT PLACEMENT Right 01/04/2015   Procedure: CYSTOSCOPY WITH RETROGRADE PYELOGRAM/URETERAL STENT PLACEMENT;  Surgeon: Franchot Gallo, MD;  Location: [facility];  Service: Urology;  Laterality: Right;  . CYSTOSCOPY W/ URETERAL STENT REMOVAL Right 01/26/2015   Procedure: CYSTOSCOPY WITH STENT REMOVAL;  Surgeon: Franchot Gallo, MD;  Location: [facility];  Service: Urology;  Laterality: Right;  . CYSTOSCOPY/URETEROSCOPY/HOLMIUM LASER/STENT PLACEMENT Right 01/26/2015   Procedure: CYSTOSCOPY/URETEROSCOPY RIGHT;  Surgeon: Franchot Gallo, MD;  Location: [facility];  Service: Urology;  Laterality: Right;  .  FEMORAL-POPLITEAL BYPASS GRAFT Left 09/11/2015   Procedure: BYPASS GRAFT LEFT ABOVE KNEE TO BELOW KNEE POPLITEAL ARTERY WITH LEFT GREATER SAPHENOUS VEIN;  Surgeon: Serafina Mitchell, MD;  Location: [place];  Service: Vascular;  Laterality: Left;  . INCISIONAL HERNIA REPAIR N/A 01/19/2016   Procedure: HERNIA REPAIR INCISIONAL;  Surgeon: Leighton Ruff, MD;  Location: [facility];  Service: General;  Laterality: N/A;  . LAMINECTOMY AND MICRODISCECTOMY LUMBAR SPINE  03-26-2009   left L5 -- S1  . LAPAROSCOPIC SIGMOID COLECTOMY N/A 01/14/2016   Procedure: LAPAROSCOPIC SIGMOID COLECTOMY;  Surgeon: Leighton Ruff, MD;   Location: [facility];  Service: General;  Laterality: N/A;  . LEFT HEART CATHETERIZATION WITH CORONARY ANGIOGRAM N/A 06/12/2012   Procedure: LEFT HEART CATHETERIZATION WITH CORONARY ANGIOGRAM;  Surgeon: Josue Hector, MD;  Location: [facility] CATH LAB;  Service: Cardiovascular;  Laterality: N/A;   No sig. CAD/  normal LVF, ef 55-65%  . LIVER BIOPSY N/A 01/14/2016   Procedure: LIVER BIOPSY;  Surgeon: Leighton Ruff, MD;  Location: [facility];  Service: General;  Laterality: N/A;  . LOWER EXTREMITY ANGIOGRAM Left 05/28/2014   Procedure: LOWER EXTREMITY ANGIOGRAM;  Surgeon: Wellington Hampshire, MD;  Location: [place] CATH LAB;  Service: Cardiovascular;  Laterality: Left;  Failed PTA CTO  . POPLITEAL ARTERY ANGIOPLASTY Left 05/28/2014    dr Fletcher Anon   Attempted and unsuccessful due to inability to cross the occlusionnotes   . PORTACATH PLACEMENT Right 01/14/2016   Procedure: INSERTION PORT-A-CATH;  Surgeon: Leighton Ruff, MD;  Location: [facility];  Service: General;  Laterality: Right;  . POSTERIOR CERVICAL FUSION/FORAMINOTOMY N/A 12/10/2012   Procedure: POSTERIOR CERVICAL FUSION/FORAMINOTOMY LEVEL 1;  Surgeon: Ophelia Charter, MD;  Location: [place] NEURO ORS;  Service: Neurosurgery;  Laterality: N/A;  Cervical three-four posterior cervical fusion with lateral mass screws  . POSTERIOR LUMBAR FUSION  11-11-2009;   07-15-2013   L5 -- S1;   L4-- S1  . SHOULDER ARTHROSCOPY Right 03-08-2004   debridement labral tear/  DCR/  acromioplasty  . TRANSTHORACIC ECHOCARDIOGRAM  01-05-2015   mild LVH/  ef 60-65%/  mild TR  . UMBILICAL HERNIA REPAIR  1980  . VEIN HARVEST Left 09/11/2015   Procedure: LEFT GREATER SAPHENOUS VEIN HARVEST;  Surgeon: Serafina Mitchell, MD;  Location: [facility];  Service: Vascular;  Laterality: Left;    Social History   Social History  . Marital status: Married    Spouse name: N/A  . Number of children: 5  . Years of education: N/A   Occupational History  . Disabled Unemployed   Social History Main Topics  .  Smoking status: Never Smoker  . Smokeless tobacco: Never Used  . Alcohol use 0.0 oz/week     Comment: RARE  . Drug use: No  . Sexual activity: Not on file   Other Topics Concern  . Not on file   Social History Narrative   Married - wife works Surveyor, quantity unit @ [employer]   5 daughters   Regular exercise-no   Disabled to due back problem   12/04/2015       Current Outpatient Prescriptions on File Prior to Visit  Medication Sig Dispense Refill  . ACCU-CHEK SOFTCLIX LANCETS lancets USE  TID TO CHECK BLOOD SUGAR LEVELS  0  . amLODipine (NORVASC) 5 MG tablet Take 1 tablet (5 mg total) by mouth daily. 90 tablet 3  . aspirin EC 81 MG tablet Take 81 mg by mouth daily.    Marland Kitchen atorvastatin (LIPITOR) 80 MG tablet Take  1 tablet (80 mg total) by mouth every evening. 90 tablet 3  . [START ON 09/21/2016] capecitabine (XELODA) 500 MG tablet Take four tablets (2000 mg) every morning and four tablets (2000 mg) every evening with food. Take days 1-7 and days 15-21, every 28 days. 112 tablet 0  . diazepam (VALIUM) 10 MG tablet Take 10 mg by mouth at bedtime as needed for anxiety.   1  . docusate sodium (COLACE) 100 MG capsule Take 100 mg by mouth 2 (two) times daily.    Marland Kitchen lidocaine-prilocaine (EMLA) cream Apply 1 application topically as needed. Apply to portacath site 1 hour prior to use 30 g 3  . magic mouthwash SOLN Take 5 mLs by mouth 4 (four) times daily as needed for mouth pain. 240 mL 1  . meclizine (ANTIVERT) 25 MG tablet Take 25 mg by mouth 3 (three) times daily as needed.  1  . metFORMIN (GLUCOPHAGE) 500 MG tablet Take 500 mg by mouth daily with breakfast. 2 tablets with supper    . metoprolol (LOPRESSOR) 50 MG tablet Take 0.5 tablets (25 mg total) by mouth 2 (two) times daily. 90 tablet 3  . nitroGLYCERIN (NITROSTAT) 0.4 MG SL tablet PLACE 1 TABLET UNDER THE TONGUE EVERY 5 MINUTES AS NEEDED FOR CHEST PAIN. 25 tablet 0  . omega-3 acid ethyl esters (LOVAZA) 1 g capsule Take 1 capsule (1 g total) by  mouth 2 (two) times daily. 60 capsule 5  . oxyCODONE-acetaminophen (PERCOCET) 10-325 MG tablet Take 1 tablet by mouth every 6 (six) hours as needed for pain (for back pain). 30 tablet 0  . pantoprazole (PROTONIX) 40 MG tablet Take 40 mg by mouth daily.   5  . potassium chloride SA (K-DUR,KLOR-CON) 20 MEQ tablet TAKE 1 TABLET BY MOUTH ONCE DAILY 90 tablet 0  . prochlorperazine (COMPAZINE) 10 MG tablet Take 1 tablet (10 mg total) by mouth every 6 (six) hours as needed for nausea or vomiting. 30 tablet 3  . promethazine (PHENERGAN) 12.5 MG tablet Take 12.5 mg by mouth every 6 (six) hours as needed.  3  . sildenafil (REVATIO) 20 MG tablet Take 20-100 mg by mouth daily as needed for erectile dysfunction.  11  . tamsulosin (FLOMAX) 0.4 MG CAPS capsule Take 0.4 mg by mouth every evening.     . venlafaxine XR (EFFEXOR-XR) 150 MG 24 hr capsule Take 225 mg by mouth daily.   5  . cyclobenzaprine (FLEXERIL) 10 MG tablet Take 1 tablet (10 mg total) by mouth 2 (two) times daily as needed for muscle spasms. (Patient not taking: Reported on 09/19/2016) 20 tablet 0  . LORazepam (ATIVAN) 0.5 MG tablet TAKE 1 TABLET BY MOUTH EVERY 8 HOURS AS NEEDED FOR SLEEP (Patient not taking: Reported on 09/19/2016) 30 tablet 0  . tiZANidine (ZANAFLEX) 4 MG tablet Take 4 mg by mouth 2 (two) times daily as needed for muscle spasms.   2   Current Facility-Administered Medications on File Prior to Visit  Medication Dose Route Frequency Provider Last Rate Last Dose  . alteplase (CATHFLO ACTIVASE) injection 2 mg  2 mg Intracatheter Once PRN Betsy Coder B, MD      . sodium chloride flush (NS) 0.9 % injection 10 mL  10 mL Intravenous PRN Ladell Pier, MD   10 mL at 02/04/16 1431    Allergies  Allergen Reactions  . Hydrocodone Hives    Family History  Problem Relation Age of Onset  . Thyroid disease Mother  hypothyroidism  . Heart attack Maternal Grandfather   . Heart Problems Father     pacermaker  . Edema Father   .  Heart disease Maternal Grandmother   . Lung cancer Maternal Grandmother   . Diabetes Maternal Grandmother   . Hypertension Maternal Grandmother     BP 132/82   Pulse 76   Ht 5\' 10"  (1.778 m)   Wt 254 lb (115.2 kg)   BMI 36.45 kg/m   Review of Systems He denies hypoglycemia.      Objective:   Physical Exam VITAL SIGNS:  See vs page GENERAL: no distress Pulses: dorsalis pedis intact bilat.   MSK: no deformity of the feet CV: no leg edema Skin:  no ulcer on the feet, but the skin is dry.  normal color and temp on the feet. Neuro: sensation is intact to touch on the feet, but decreased from normal.    Lab Results  Component Value Date   HGBA1C 6.8 09/19/2016   Lab Results  Component Value Date   TSH 0.16 (L) 09/19/2016      Assessment & Plan:  Insulin-requiring type 2 DM, with PAD: overcontrolled, given this regimen, which does match insulin to his changing needs throughout the day Hypothyroidism: he needs decreased rx.  I have sent a prescription to your pharmacy, to decrease synthroid Stage 4 colon cancer: he is not a candidate for aggressive glycemic control.  Patient Instructions  Please decrease thje insulin to 50 units twice a day (breakfast and supper) check your blood sugar twice a day.  vary the time of day when you check, between before the 3 meals, and at bedtime.  also check if you have symptoms of your blood sugar being too high or too low.  please keep a record of the readings and bring it to your next appointment here (or you can bring the meter itself).  You can write it on any piece of paper.  please call us sooner if your blood sugar goes below 70, or if you have a lot of readings over 200.  blood tests are requested for you today.  We'll let you know about the results. Please come back for a follow-up appointment in 4 months.

## 2016-09-27 ENCOUNTER — Ambulatory Visit (HOSPITAL_BASED_OUTPATIENT_CLINIC_OR_DEPARTMENT_OTHER): Payer: Medicaid Other | Admitting: Oncology

## 2016-09-27 ENCOUNTER — Other Ambulatory Visit: Payer: Self-pay | Admitting: *Deleted

## 2016-09-27 ENCOUNTER — Telehealth: Payer: Self-pay | Admitting: Oncology

## 2016-09-27 ENCOUNTER — Other Ambulatory Visit (HOSPITAL_BASED_OUTPATIENT_CLINIC_OR_DEPARTMENT_OTHER): Payer: Medicaid Other

## 2016-09-27 ENCOUNTER — Encounter: Payer: Self-pay | Admitting: Vascular Surgery

## 2016-09-27 VITALS — BP 131/90 | HR 86 | Temp 98.1°F | Resp 18 | Ht 70.0 in | Wt 257.4 lb

## 2016-09-27 DIAGNOSIS — C187 Malignant neoplasm of sigmoid colon: Secondary | ICD-10-CM | POA: Diagnosis present

## 2016-09-27 DIAGNOSIS — R200 Anesthesia of skin: Secondary | ICD-10-CM | POA: Diagnosis not present

## 2016-09-27 DIAGNOSIS — R739 Hyperglycemia, unspecified: Secondary | ICD-10-CM | POA: Diagnosis not present

## 2016-09-27 DIAGNOSIS — C787 Secondary malignant neoplasm of liver and intrahepatic bile duct: Secondary | ICD-10-CM

## 2016-09-27 DIAGNOSIS — I10 Essential (primary) hypertension: Secondary | ICD-10-CM | POA: Diagnosis not present

## 2016-09-27 DIAGNOSIS — F418 Other specified anxiety disorders: Secondary | ICD-10-CM | POA: Diagnosis not present

## 2016-09-27 DIAGNOSIS — C189 Malignant neoplasm of colon, unspecified: Secondary | ICD-10-CM

## 2016-09-27 DIAGNOSIS — R911 Solitary pulmonary nodule: Secondary | ICD-10-CM

## 2016-09-27 LAB — CBC WITH DIFFERENTIAL/PLATELET
BASO%: 0 % (ref 0.0–2.0)
Basophils Absolute: 0 10*3/uL (ref 0.0–0.1)
EOS%: 5.5 % (ref 0.0–7.0)
Eosinophils Absolute: 0.3 10*3/uL (ref 0.0–0.5)
HCT: 38 % — ABNORMAL LOW (ref 38.4–49.9)
HGB: 12.3 g/dL — ABNORMAL LOW (ref 13.0–17.1)
LYMPH%: 34.1 % (ref 14.0–49.0)
MCH: 28.7 pg (ref 27.2–33.4)
MCHC: 32.4 g/dL (ref 32.0–36.0)
MCV: 88.8 fL (ref 79.3–98.0)
MONO#: 0.4 10*3/uL (ref 0.1–0.9)
MONO%: 6.2 % (ref 0.0–14.0)
NEUT#: 3.2 10*3/uL (ref 1.5–6.5)
NEUT%: 54.2 % (ref 39.0–75.0)
Platelets: 131 10*3/uL — ABNORMAL LOW (ref 140–400)
RBC: 4.28 10*6/uL (ref 4.20–5.82)
RDW: 17.8 % — ABNORMAL HIGH (ref 11.0–14.6)
WBC: 5.8 10*3/uL (ref 4.0–10.3)
lymph#: 2 10*3/uL (ref 0.9–3.3)

## 2016-09-27 LAB — COMPREHENSIVE METABOLIC PANEL
ALT: 57 U/L — ABNORMAL HIGH (ref 0–55)
AST: 43 U/L — ABNORMAL HIGH (ref 5–34)
Albumin: 3.8 g/dL (ref 3.5–5.0)
Alkaline Phosphatase: 82 U/L (ref 40–150)
Anion Gap: 12 mEq/L — ABNORMAL HIGH (ref 3–11)
BUN: 6.7 mg/dL — ABNORMAL LOW (ref 7.0–26.0)
CO2: 23 mEq/L (ref 22–29)
Calcium: 8.7 mg/dL (ref 8.4–10.4)
Chloride: 107 mEq/L (ref 98–109)
Creatinine: 1.1 mg/dL (ref 0.7–1.3)
EGFR: >90 mL/min/{1.73_m2} (ref >90)
Glucose: 178 mg/dl — ABNORMAL HIGH (ref 70–140)
Potassium: 3.9 mEq/L (ref 3.5–5.1)
Sodium: 141 mEq/L (ref 136–145)
Total Bilirubin: 0.51 mg/dL (ref 0.20–1.20)
Total Protein: 7.3 g/dL (ref 6.4–8.3)

## 2016-09-27 LAB — CEA (IN HOUSE-CHCC): CEA (CHCC-In House): 1.08 ng/mL (ref 0.00–5.00)

## 2016-09-27 MED ORDER — CAPECITABINE 500 MG PO TABS: ORAL_TABLET | ORAL | 0 refills | Status: DC

## 2016-09-27 MED FILL — CAPECITABINE 500 MG TABLET: 500 | 14 days supply | Qty: 112 | Fill #0

## 2016-09-27 NOTE — Telephone Encounter (Signed)
Gave patient AVS and calender per 5/15 los.  

## 2016-09-27 NOTE — Progress Notes (Signed)
[place] OFFICE PROGRESS NOTE   Diagnosis: Colon cancer  INTERVAL HISTORY:   Jeff [name] returns as scheduled. He continues Xeloda. No mouth sores, diarrhea, or hand/foot pain. He continues to have numbness in the fingers and feet. This does not interfere with activity. He reports the blood sugar is under better control.  Objective:  Vital signs in last 24 hours:  Blood pressure 131/90, pulse 86, temperature 98.1 F (36.7 C), temperature source Oral, resp. rate 18, height '5\' 10"'$  (1.778 m), weight 257 lb 6.4 oz (116.8 kg), SpO2 100 %.    HEENT: No thrush or ulcers Resp: Lungs clear bilaterally Cardio: Regular rate and rhythm GI: No hepatosplenomegaly Vascular: No leg edema  Skin: Dryness at the hands and feet with mild hyperpigmentation. No erythema.   Portacath/PICC-without erythema  Lab Results:  Lab Results  Component Value Date   WBC 5.8 09/27/2016   HGB 12.3 (L) 09/27/2016   HCT 38.0 (L) 09/27/2016   MCV 88.8 09/27/2016   PLT 131 (L) 09/27/2016   NEUTROABS 3.2 09/27/2016    CMP     Component Value Date/Time   NA 141 09/27/2016 0920   K 3.9 09/27/2016 0920   CL 98 (L) 06/27/2016 1331   CO2 23 09/27/2016 0920   GLUCOSE 178 (H) 09/27/2016 0920   BUN 6.7 (L) 09/27/2016 0920   CREATININE 1.1 09/27/2016 0920   CALCIUM 8.7 09/27/2016 0920   PROT 7.3 09/27/2016 0920   ALBUMIN 3.8 09/27/2016 0920   AST 43 (H) 09/27/2016 0920   ALT 57 (H) 09/27/2016 0920   ALKPHOS 82 09/27/2016 0920   BILITOT 0.51 09/27/2016 0920   GFRNONAA 59 (L) 06/27/2016 1331   GFRAA >60 06/27/2016 1331    Lab Results  Component Value Date   CEA1 1.26 08/16/2016    Medications: I have reviewed the patient's current medications.  Assessment/Plan: 1. 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 CTschest, abdomen, and pelvis on 12/11/2015-indeterminate tiny pulmonary nodules,  multiple liver metastases, small nodes in the sigmoid mesocolon  3. Laparoscopic sigmoid colectomy, liver biopsy, Port-A-Cath placement 01/14/2016  4. Pathology sigmoid colon resection-colonic adenocarcinoma, 5.3 cm extending into pericolonic connective tissue, positive lymph-vascular invasion, positive perineural invasion, negative margins, metastatic carcinoma in9 of 28 lymph nodes  5. Pathology liver biopsy-metastatic colorectal adenocarcinoma  6. MSI stable; mismatch repair protein normal  7. 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 Cycle 1 FOLFOX 02/02/2016  9. Cycle 2 FOLFOX 02/15/2016  10. Cycle 3 FOLFOX 02/29/2016  11. Cycle 4 FOLFOX 03/14/2016  12. Cycle 5 FOLFOX 03/28/2016  13. Cycle 6 FOLFOX 04/11/2016 (oxaliplatin held secondary to thrombocytopenia)  14. 04/21/2016 restaging CTs-difficulty evaluating liver lesions due to hepatic steatosis. Stable right adrenal nodule. No adenopathy or local recurrence near the rectosigmoid anastomotic site.  15. Cycle 7 FOLFOX 04/25/2016  16. MRI liver 05/02/2016-partial improvement in hepatic metastases  17. Cycle 8 FOLFOX 05/10/2016  18. Cycle 9 FOLFOX 05/23/2016 (oxaliplatin held due to thrombocytopenia)  19. Cycle 10 FOLFOX 06/06/2016 (oxaliplatin dose reduced due to thrombocytopenia)  20. Cycle 11 FOLFOX 06/27/2016 (oxaliplatin held due to neuropathy)  21. Cycle 12 FOLFOX 07/11/2016 (oxaliplatin held) 22. Initiation of maintenance Xeloda 7 days on/7 days off 07/27/2016  2. Rectal bleeding and constipation secondary to #1  3. History of peripheral vascular disease, status post left lower extremity vascular bypass surgery in April 2017  4. History of nephrolithiasis  5. History of  Graves' disease treated with radioactive iodine  6. Anxiety/depression  7. Hypertension  8. Hospitalization 01/19/2016 with wound dehiscence status post secondary suture closure of abdominal wall  9.  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 Hyperglycemia 06/20/2016-he appears to have new onset diabetes. Maintained on insulin.     Disposition:  Jeff [name] is tolerating the Xeloda well. He has persistent symptoms of oxaliplatin neuropathy. The plan is to continue Xeloda on a 7 day on/7 day off schedule. We will follow-up on the CEA from today.  He will return for an office visit and Port-A-Cath flush in 3 weeks.  He declines a referral to the genetics counselor.  Betsy Coder, MD  09/27/2016  10:31 AM

## 2016-09-30 MED FILL — UNIFINE PENTIPS 31GX3/16": 31G X 5 MM | 50 days supply | Qty: 100 | Fill #0

## 2016-09-30 MED FILL — NOVOLOG FLEXPEN SYRINGE: 100 | 30 days supply | Qty: 30 | Fill #0

## 2016-09-30 MED FILL — UNIFINE PENTIPS 31GX3/16: 31G X 5 MM | 50 days supply | Qty: 100 | Fill #0

## 2016-10-03 ENCOUNTER — Ambulatory Visit: Payer: Medicaid Other | Admitting: Surgery

## 2016-10-18 ENCOUNTER — Telehealth: Payer: Self-pay | Admitting: Oncology

## 2016-10-18 ENCOUNTER — Other Ambulatory Visit (HOSPITAL_BASED_OUTPATIENT_CLINIC_OR_DEPARTMENT_OTHER): Payer: Medicaid Other

## 2016-10-18 ENCOUNTER — Ambulatory Visit: Payer: Medicaid Other

## 2016-10-18 ENCOUNTER — Ambulatory Visit (HOSPITAL_BASED_OUTPATIENT_CLINIC_OR_DEPARTMENT_OTHER): Payer: Medicaid Other | Admitting: Nurse Practitioner

## 2016-10-18 VITALS — BP 106/68 | HR 92 | Temp 98.0°F | Resp 18 | Ht 70.0 in | Wt 260.0 lb

## 2016-10-18 DIAGNOSIS — C787 Secondary malignant neoplasm of liver and intrahepatic bile duct: Secondary | ICD-10-CM

## 2016-10-18 DIAGNOSIS — I10 Essential (primary) hypertension: Secondary | ICD-10-CM | POA: Diagnosis not present

## 2016-10-18 DIAGNOSIS — C189 Malignant neoplasm of colon, unspecified: Secondary | ICD-10-CM

## 2016-10-18 DIAGNOSIS — C187 Malignant neoplasm of sigmoid colon: Secondary | ICD-10-CM

## 2016-10-18 DIAGNOSIS — R200 Anesthesia of skin: Secondary | ICD-10-CM

## 2016-10-18 DIAGNOSIS — F419 Anxiety disorder, unspecified: Secondary | ICD-10-CM | POA: Diagnosis not present

## 2016-10-18 DIAGNOSIS — R739 Hyperglycemia, unspecified: Secondary | ICD-10-CM

## 2016-10-18 DIAGNOSIS — Z95828 Presence of other vascular implants and grafts: Secondary | ICD-10-CM

## 2016-10-18 LAB — CBC WITH DIFFERENTIAL/PLATELET
BASO%: 0.2 % (ref 0.0–2.0)
Basophils Absolute: 0 10*3/uL (ref 0.0–0.1)
EOS%: 5.2 % (ref 0.0–7.0)
Eosinophils Absolute: 0.3 10*3/uL (ref 0.0–0.5)
HCT: 37.9 % — ABNORMAL LOW (ref 38.4–49.9)
HGB: 12.4 g/dL — ABNORMAL LOW (ref 13.0–17.1)
LYMPH%: 32.3 % (ref 14.0–49.0)
MCH: 28.8 pg (ref 27.2–33.4)
MCHC: 32.7 g/dL (ref 32.0–36.0)
MCV: 88.2 fL (ref 79.3–98.0)
MONO#: 0.5 10*3/uL (ref 0.1–0.9)
MONO%: 7.4 % (ref 0.0–14.0)
NEUT#: 3.6 10*3/uL (ref 1.5–6.5)
NEUT%: 54.9 % (ref 39.0–75.0)
Platelets: 146 10*3/uL (ref 140–400)
RBC: 4.3 10*6/uL (ref 4.20–5.82)
RDW: 21.4 % — ABNORMAL HIGH (ref 11.0–14.6)
WBC: 6.6 10*3/uL (ref 4.0–10.3)
lymph#: 2.1 10*3/uL (ref 0.9–3.3)

## 2016-10-18 LAB — CEA (IN HOUSE-CHCC): CEA (CHCC-In House): <1 ng/mL (ref 0.00–5.00)

## 2016-10-18 LAB — COMPREHENSIVE METABOLIC PANEL
ALT: 53 U/L (ref 0–55)
AST: 43 U/L — ABNORMAL HIGH (ref 5–34)
Albumin: 3.8 g/dL (ref 3.5–5.0)
Alkaline Phosphatase: 80 U/L (ref 40–150)
Anion Gap: 12 mEq/L — ABNORMAL HIGH (ref 3–11)
BUN: 11.5 mg/dL (ref 7.0–26.0)
CO2: 23 mEq/L (ref 22–29)
Calcium: 8.7 mg/dL (ref 8.4–10.4)
Chloride: 105 mEq/L (ref 98–109)
Creatinine: 1.2 mg/dL (ref 0.7–1.3)
EGFR: 86 mL/min/{1.73_m2} — ABNORMAL LOW (ref >90)
Glucose: 168 mg/dl — ABNORMAL HIGH (ref 70–140)
Potassium: 3.8 mEq/L (ref 3.5–5.1)
Sodium: 140 mEq/L (ref 136–145)
Total Bilirubin: 0.5 mg/dL (ref 0.20–1.20)
Total Protein: 7.2 g/dL (ref 6.4–8.3)

## 2016-10-18 MED ORDER — PROCHLORPERAZINE MALEATE 10 MG PO TABS: 10.0000 mg | ORAL_TABLET | Freq: Four times a day (QID) | ORAL | 3 refills | Status: DC | PRN

## 2016-10-18 MED ORDER — HEPARIN SOD (PORK) LOCK FLUSH 100 UNIT/ML IV SOLN
500.0000 [IU] | Freq: Once | INTRAVENOUS | Status: AC | PRN
  Administered 2016-10-18: 500 [IU] via INTRAVENOUS
  Filled 2016-10-18: qty 5

## 2016-10-18 MED ORDER — CAPECITABINE 500 MG PO TABS: ORAL_TABLET | ORAL | 0 refills | Status: DC

## 2016-10-18 MED ORDER — SODIUM CHLORIDE 0.9% FLUSH
10.0000 mL | INTRAVENOUS | Status: AC | PRN
  Administered 2016-10-18: 10 mL via INTRAVENOUS
  Filled 2016-10-18: qty 10

## 2016-10-18 MED FILL — CAPECITABINE 500 MG TABLET: 500 | 28 days supply | Qty: 112 | Fill #0

## 2016-10-18 MED FILL — PROCHLORPERAZINE 10 MG TAB: 10 | 7 days supply | Qty: 30 | Fill #0

## 2016-10-18 NOTE — Progress Notes (Signed)
Jeff [name]   Diagnosis:  Colon cancer  INTERVAL HISTORY:   Jeff [name] returns as scheduled. He continues Xeloda. He feels well. He denies nausea/vomiting. No mouth sores. No diarrhea. No hand or foot pain or redness. He continues to have numbness in the fingertips and toes. No abdominal pain.  Objective:  Vital signs in last 24 hours:  Blood pressure 106/68, pulse 92, temperature 98 F (36.7 C), temperature source Oral, resp. rate 18, height _0  (1.778 m), weight 260 lb (117.9 kg), SpO2 100 %.    HEENT: No thrush or ulcers. Resp: Lungs clear bilaterally. Cardio: Regular rate and rhythm. GI: Abdomen soft and nontender. No hepatomegaly. Vascular: No leg edema.  Skin: Palms without erythema.  Port-A-Cath without erythema.  Lab Results:  Lab Results  Component Value Date   WBC 6.6 10/18/2016   HGB 12.4 (L) 10/18/2016   HCT 37.9 (L) 10/18/2016   MCV 88.2 10/18/2016   PLT 146 10/18/2016   NEUTROABS 3.6 10/18/2016    Imaging:  No results found.  Medications: I have reviewed the patient's current medications.  Assessment/Plan: 1. 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 CTschest, abdomen, and pelvis on 12/11/2015-indeterminate tiny pulmonary nodules, multiple liver metastases, small nodes in the sigmoid mesocolon  3. Laparoscopic sigmoid colectomy, liver biopsy, Port-A-Cath placement 01/14/2016  4. Pathology sigmoid colon resection-colonic adenocarcinoma, 5.3 cm extending into pericolonic connective tissue, positive lymph-vascular invasion, positive perineural invasion, negative margins, metastatic carcinoma in9 of 28 lymph nodes  5. Pathology liver biopsy-metastatic colorectal adenocarcinoma  6. MSI stable; mismatch repair protein normal  7. 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 Cycle 1 FOLFOX 02/02/2016  9. Cycle 2  FOLFOX 02/15/2016  10. Cycle 3 FOLFOX 02/29/2016  11. Cycle 4 FOLFOX 03/14/2016  12. Cycle 5 FOLFOX 03/28/2016  13. Cycle 6 FOLFOX 04/11/2016 (oxaliplatin held secondary to thrombocytopenia)  14. 04/21/2016 restaging CTs-difficulty evaluating liver lesions due to hepatic steatosis. Stable right adrenal nodule. No adenopathy or local recurrence near the rectosigmoid anastomotic site.  15. Cycle 7 FOLFOX 04/25/2016  16. MRI liver 05/02/2016-partial improvement in hepatic metastases  17. Cycle 8 FOLFOX 05/10/2016  18. Cycle 9 FOLFOX 05/23/2016 (oxaliplatin held due to thrombocytopenia)  19. Cycle 10 FOLFOX 06/06/2016 (oxaliplatin dose reduced due to thrombocytopenia)  20. Cycle 11 FOLFOX 06/27/2016 (oxaliplatin held due to neuropathy)  21. Cycle 12 FOLFOX 07/11/2016 (oxaliplatin held) 22. Initiation of maintenance Xeloda 7 days on/7 days off 07/27/2016  2. Rectal bleeding and constipation secondary to #1  3. History of peripheral vascular disease, status post left lower extremity vascular bypass surgery in April 2017  4. History of nephrolithiasis  5. History of Graves' disease treated with radioactive iodine  6. Anxiety/depression  7. Hypertension  8. Hospitalization 01/19/2016 with wound dehiscence status post secondary suture closure of abdominal wall  9. 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 Hyperglycemia 06/20/2016-he appears to have new onset diabetes. Maintained on insulin.    Disposition: [name] appears stable. There is no clinical evidence of disease progression. Plan to continue Xeloda 7 days on/7 days off. We are referring him for a restaging MRI of the liver a few days prior to his next visit in 3 weeks. He will return for a follow-up visit on 11/08/2016 to review the results. He will contact the office in the interim with any problems.  Plan reviewed with  Dr.  Benay Spice.    Ned Card ANP/GNP-BC   10/18/2016  10:00 AM

## 2016-10-18 NOTE — Addendum Note (Signed)
Addended by: Jethro Bolus A on: 10/18/2016 10:37 AM   Modules accepted: Orders

## 2016-10-18 NOTE — Patient Instructions (Signed)
Implanted Port Home Guide An implanted port is a type of central line that is placed under the skin. Central lines are used to provide IV access when treatment or nutrition needs to be given through a person's veins. Implanted ports are used for long-term IV access. An implanted port may be placed because:  You need IV medicine that would be irritating to the small veins in your hands or arms.  You need long-term IV medicines, such as antibiotics.  You need IV nutrition for a long period.  You need frequent blood draws for lab tests.  You need dialysis.  Implanted ports are usually placed in the chest area, but they can also be placed in the upper arm, the abdomen, or the leg. An implanted port has two main parts:  Reservoir. The reservoir is round and will appear as a small, raised area under your skin. The reservoir is the part where a needle is inserted to give medicines or draw blood.  Catheter. The catheter is a thin, flexible tube that extends from the reservoir. The catheter is placed into a large vein. Medicine that is inserted into the reservoir goes into the catheter and then into the vein.  How will I care for my incision site? Do not get the incision site wet. Bathe or shower as directed by your health care provider. How is my port accessed? Special steps must be taken to access the port:  Before the port is accessed, a numbing cream can be placed on the skin. This helps numb the skin over the port site.  Your health care provider uses a sterile technique to access the port. ? Your health care provider must put on a mask and sterile gloves. ? The skin over your port is cleaned carefully with an antiseptic and allowed to dry. ? The port is gently pinched between sterile gloves, and a needle is inserted into the port.  Only "non-coring" port needles should be used to access the port. Once the port is accessed, a blood return should be checked. This helps ensure that the port  is in the vein and is not clogged.  If your port needs to remain accessed for a constant infusion, a clear (transparent) bandage will be placed over the needle site. The bandage and needle will need to be changed every week, or as directed by your health care provider.  Keep the bandage covering the needle clean and dry. Do not get it wet. Follow your health care provider's instructions on how to take a shower or bath while the port is accessed.  If your port does not need to stay accessed, no bandage is needed over the port.  What is flushing? Flushing helps keep the port from getting clogged. Follow your health care provider's instructions on how and when to flush the port. Ports are usually flushed with saline solution or a medicine called heparin. The need for flushing will depend on how the port is used.  If the port is used for intermittent medicines or blood draws, the port will need to be flushed: ? After medicines have been given. ? After blood has been drawn. ? As part of routine maintenance.  If a constant infusion is running, the port may not need to be flushed.  How long will my port stay implanted? The port can stay in for as long as your health care provider thinks it is needed. When it is time for the port to come out, surgery will be   done to remove it. The procedure is similar to the one performed when the port was put in. When should I seek immediate medical care? When you have an implanted port, you should seek immediate medical care if:  You notice a bad smell coming from the incision site.  You have swelling, redness, or drainage at the incision site.  You have more swelling or pain at the port site or the surrounding area.  You have a fever that is not controlled with medicine.  This information is not intended to replace advice given to you by your health care provider. Make sure you discuss any questions you have with your health care provider. Document  Released: 05/02/2005 Document Revised: 10/08/2015 Document Reviewed: 01/07/2013 Elsevier Interactive Patient Education  2017 [employer]  

## 2016-10-18 NOTE — Telephone Encounter (Signed)
Gave patient AVS and calender per 6/5 LOS - Central radiology to contact patient with CT

## 2016-10-19 ENCOUNTER — Encounter: Payer: Self-pay | Admitting: Surgery

## 2016-10-24 ENCOUNTER — Ambulatory Visit (INDEPENDENT_AMBULATORY_CARE_PROVIDER_SITE_OTHER): Payer: Medicaid Other | Admitting: Surgery

## 2016-10-24 ENCOUNTER — Encounter: Payer: Self-pay | Admitting: Surgery

## 2016-10-24 VITALS — BP 125/79 | HR 83 | Temp 98.4°F | Resp 20 | Ht 70.0 in | Wt 264.0 lb

## 2016-10-24 DIAGNOSIS — I70212 Atherosclerosis of native arteries of extremities with intermittent claudication, left leg: Secondary | ICD-10-CM

## 2016-10-24 NOTE — Addendum Note (Signed)
Addended by: Lianne Cure A on: 10/24/2016 04:24 PM   Modules accepted: Orders

## 2016-10-24 NOTE — Progress Notes (Signed)
[facility] [place]  Patient name: Jeff [name] MRN: [id number] DOB: Oct 17, 1965 Sex: male   REASON FOR VISIT:    Follow up  HISOTRY OF PRESENT ILLNESS:    Jeff [name] is a 51 y.o. male who returns today for follow-up.  The patient has a long-standing history of left leg claudication, beginning at approximately age 36.  His symptoms were initially attributed to lower back issues, however angiography by Dr. Fletcher Anon revealed a focal occlusion within the left popliteal artery.  An attempted percutaneous revascularization was performed, but was unsuccessful.  This was done through an antegrade approach and the patient ended up with a large scrotal hematoma.  On 09/11/2015 he underwent a left above-knee to tibioperoneal trunk bypass graft with ipsilateral non-reversed greater saphenous vein.  He had an excellent vein measuring approximately 5 mm.  He has an early takeoff of the anterior tibial artery.  The patient does complain of some numbness in the lower part of his leg.  Unfortunately in late 2017 he was diagnosed with metastatic colon cancer.  He is undergone colectomy and has completed chemotherapy.  He is maintained on oral medication currently.  He does appear to be recovering from this quite well.  From a cardiac perspective, the patient has a history of an MI but at coronary angiography this was attributed to coronary spasm. He is medically managed for hypercholesterolemia with a statin. He is medically managed for hypertension. He is a nonsmoker   PAST MEDICAL HISTORY:   Past Medical History:  Diagnosis Date  . Allergic rhinitis   . Anxiety   . Arthritis   . Asthma   . At risk for sleep apnea    STOP-BANG= 6       SENT TO PCP 01-22-2015  . Chronic back pain    "from the neck to the lower back"   . 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 Depression   . Dysthymic disorder   . ED (erectile dysfunction)   . GERD (gastroesophageal reflux disease)   . History of acute pyelonephritis    01-07-2015  . History of Graves' disease    vs  Multinodular  . History of hiatal hernia   . History of kidney stones   . History of non-ST elevation myocardial infarction (NSTEMI)    Jan 2014--  no CAD;  per notes probable coronary vasospasm  . History of panic attacks   . History of septic shock    01-07-2015--  ureterolithias/ pyelonephritis  . History of thyroid storm    Nov 2011  . Hyperlipidemia   . Hypertension   . Hypothyroidism following radioiodine therapy    RAI in Mar 2012---  followed by dr Loanne Drilling  . PAD (peripheral artery disease) [facility]) cardiologist-  dr Fletcher Anon   a.  ABI (01/2014):  L 0.53; R 1.0 >> referred to [facility]  . Right ureteral stone   . Septic shock ([place]) 01/05/2015  . Sleep disturbance   . Thrombocytopenia ([place])      FAMILY HISTORY:   Family History  Problem Relation Age of Onset  . Thyroid disease Mother        hypothyroidism  . Heart attack Maternal Grandfather   . Heart Problems Father        pacermaker  . Edema Father   . Heart disease Maternal Grandmother   . Lung  cancer Maternal Grandmother   . Diabetes Maternal Grandmother   . Hypertension Maternal Grandmother     SOCIAL HISTORY:   Social History  Substance Use Topics  . Smoking status: Never Smoker  . Smokeless tobacco: Never Used  . Alcohol use 0.0 oz/week     Comment: RARE     ALLERGIES:   Allergies  Allergen Reactions  . Hydrocodone Hives     CURRENT MEDICATIONS:   Current Outpatient Prescriptions  Medication Sig Dispense Refill  . ACCU-CHEK SOFTCLIX LANCETS lancets USE  TID TO CHECK BLOOD SUGAR LEVELS  0  . amLODipine (NORVASC) 5 MG tablet Take 1 tablet (5 mg total) by mouth daily. 90 tablet 3  . aspirin EC 81 MG tablet Take 81 mg by mouth daily.    Marland Kitchen atorvastatin (LIPITOR) 80 MG  tablet Take 1 tablet (80 mg total) by mouth every evening. 90 tablet 3  . capecitabine (XELODA) 500 MG tablet Take four tablets (2000 mg) every morning and four tablets (2000 mg) every evening with food. Take days 1-7 and days 15-21, every 28 days. 112 tablet 0  . cyclobenzaprine (FLEXERIL) 10 MG tablet Take 1 tablet (10 mg total) by mouth 2 (two) times daily as needed for muscle spasms. 20 tablet 0  . diazepam (VALIUM) 10 MG tablet Take 10 mg by mouth at bedtime as needed for anxiety.   1  . docusate sodium (COLACE) 100 MG capsule Take 100 mg by mouth 2 (two) times daily.    . insulin aspart (NOVOLOG FLEXPEN) 100 UNIT/ML FlexPen Inject 50 Units into the skin 2 (two) times daily with a meal. 45 mL 5  . levothyroxine (SYNTHROID, LEVOTHROID) 137 MCG tablet Take 1 tablet (137 mcg total) by mouth daily before breakfast. 30 tablet 11  . lidocaine-prilocaine (EMLA) cream Apply 1 application topically as needed. Apply to portacath site 1 hour prior to use 30 g 3  . LORazepam (ATIVAN) 0.5 MG tablet TAKE 1 TABLET BY MOUTH EVERY 8 HOURS AS NEEDED FOR SLEEP 30 tablet 0  . magic mouthwash SOLN Take 5 mLs by mouth 4 (four) times daily as needed for mouth pain. 240 mL 1  . meclizine (ANTIVERT) 25 MG tablet Take 25 mg by mouth 3 (three) times daily as needed.  1  . metFORMIN (GLUCOPHAGE) 500 MG tablet Take 500 mg by mouth daily with breakfast. 2 tablets with supper    . metoprolol (LOPRESSOR) 50 MG tablet Take 0.5 tablets (25 mg total) by mouth 2 (two) times daily. 90 tablet 3  . nitroGLYCERIN (NITROSTAT) 0.4 MG SL tablet PLACE 1 TABLET UNDER THE TONGUE EVERY 5 MINUTES AS NEEDED FOR CHEST PAIN. 25 tablet 0  . omega-3 acid ethyl esters (LOVAZA) 1 g capsule Take 1 capsule (1 g total) by mouth 2 (two) times daily. 60 capsule 5  . oxyCODONE-acetaminophen (PERCOCET) 10-325 MG tablet Take 1 tablet by mouth every 6 (six) hours as needed for pain (for back pain). 30 tablet 0  . pantoprazole (PROTONIX) 40 MG tablet Take 40  mg by mouth daily.   5  . potassium chloride SA (K-DUR,KLOR-CON) 20 MEQ tablet TAKE 1 TABLET BY MOUTH ONCE DAILY 90 tablet 0  . prochlorperazine (COMPAZINE) 10 MG tablet Take 1 tablet (10 mg total) by mouth every 6 (six) hours as needed for nausea or vomiting. 30 tablet 3  . promethazine (PHENERGAN) 12.5 MG tablet Take 12.5 mg by mouth every 6 (six) hours as needed.  3  . sildenafil (REVATIO) 20 MG  tablet Take 20-100 mg by mouth daily as needed for erectile dysfunction.  11  . tamsulosin (FLOMAX) 0.4 MG CAPS capsule Take 0.4 mg by mouth every evening.     Marland Kitchen tiZANidine (ZANAFLEX) 4 MG tablet Take 4 mg by mouth 2 (two) times daily as needed for muscle spasms.   2  . venlafaxine XR (EFFEXOR-XR) 150 MG 24 hr capsule Take 225 mg by mouth daily.   5   No current facility-administered medications for this visit.    Facility-Administered Medications Ordered in Other Visits  Medication Dose Route Frequency Provider Last Rate Last Dose  . alteplase (CATHFLO ACTIVASE) injection 2 mg  2 mg Intracatheter Once PRN Betsy Coder B, MD      . sodium chloride flush (NS) 0.9 % injection 10 mL  10 mL Intravenous PRN Ladell Pier, MD   10 mL at 02/04/16 1431  . sodium chloride flush (NS) 0.9 % injection 10 mL  10 mL Intravenous PRN Ladell Pier, MD   10 mL at 10/18/16 4970    REVIEW OF SYSTEMS:   [X]  denotes positive finding, [ ]  denotes negative finding Cardiac  Comments:  Chest pain or chest pressure:    Shortness of breath upon exertion:    Short of breath when lying flat:    Irregular heart rhythm:        Vascular    Pain in calf, thigh, or hip brought on by ambulation:    Pain in feet at night that wakes you up from your sleep:  x   Blood clot in your veins:    Leg swelling:         Pulmonary    Oxygen at home:    Productive cough:     Wheezing:         Neurologic    Sudden weakness in arms or legs:     Sudden numbness in arms or legs:     Sudden onset of difficulty speaking or  slurred speech:    Temporary loss of vision in one eye:     Problems with dizziness:         Gastrointestinal    Blood in stool:     Vomited blood:         Genitourinary    Burning when urinating:     Blood in urine:        Psychiatric    Major depression:         Hematologic    Bleeding problems:    Problems with blood clotting too easily:        Skin    Rashes or ulcers:        Constitutional    Fever or chills:      PHYSICAL EXAM:   Vitals:   10/24/16 1051  BP: 125/79  Pulse: 83  Resp: 20  Temp: 98.4 F (36.9 C)  TempSrc: Oral  SpO2: 94%  Weight: 264 lb (119.7 kg)  Height: 5\' 10"  (1.778 m)    GENERAL: The patient is a well-nourished male, in no acute distress. The vital signs are documented above. CARDIAC: There is a regular rate and rhythm.  VASCULAR: Palpable left posterior tibial pulse PULMONARY: Non-labored respirations MUSCULOSKELETAL: There are no major deformities or cyanosis. NEUROLOGIC: No focal weakness or paresthesias are detected. SKIN: There are no ulcers or rashes noted. PSYCHIATRIC: The patient has a normal affect.  STUDIES:   None today  MEDICAL ISSUES:   The patient is status post  left leg bypass for severe claudication and known popliteal occlusion.  I last saw him in June of 2017.  I had recommended surveillance ultrasound imaging of his bypass graft.  [facility] has requested that I evaluate the patient again before approval of the ultrasound is granted.  The patient needs routine surveillance imaging of his bypass graft based on our protocol.  I would like for him to return within the next 4-6 weeks with a duplex of the bypass graft as well as ABIs.   Annamarie Major, MD [facility] [phone] Pager [phone]

## 2016-11-08 ENCOUNTER — Other Ambulatory Visit (HOSPITAL_BASED_OUTPATIENT_CLINIC_OR_DEPARTMENT_OTHER): Payer: Medicaid Other

## 2016-11-08 ENCOUNTER — Ambulatory Visit (HOSPITAL_BASED_OUTPATIENT_CLINIC_OR_DEPARTMENT_OTHER): Payer: Medicaid Other | Admitting: Oncology

## 2016-11-08 ENCOUNTER — Telehealth: Payer: Self-pay | Admitting: Oncology

## 2016-11-08 VITALS — BP 131/76 | HR 88 | Temp 97.9°F | Resp 18 | Ht 70.0 in | Wt 260.3 lb

## 2016-11-08 DIAGNOSIS — I10 Essential (primary) hypertension: Secondary | ICD-10-CM | POA: Diagnosis not present

## 2016-11-08 DIAGNOSIS — F418 Other specified anxiety disorders: Secondary | ICD-10-CM | POA: Diagnosis not present

## 2016-11-08 DIAGNOSIS — R911 Solitary pulmonary nodule: Secondary | ICD-10-CM

## 2016-11-08 DIAGNOSIS — C787 Secondary malignant neoplasm of liver and intrahepatic bile duct: Secondary | ICD-10-CM | POA: Diagnosis not present

## 2016-11-08 DIAGNOSIS — C187 Malignant neoplasm of sigmoid colon: Secondary | ICD-10-CM

## 2016-11-08 DIAGNOSIS — R1031 Right lower quadrant pain: Secondary | ICD-10-CM

## 2016-11-08 DIAGNOSIS — E119 Type 2 diabetes mellitus without complications: Secondary | ICD-10-CM | POA: Diagnosis not present

## 2016-11-08 DIAGNOSIS — C189 Malignant neoplasm of colon, unspecified: Secondary | ICD-10-CM

## 2016-11-08 LAB — CBC WITH DIFFERENTIAL/PLATELET
BASO%: 0.1 % (ref 0.0–2.0)
Basophils Absolute: 0 10*3/uL (ref 0.0–0.1)
EOS%: 4.5 % (ref 0.0–7.0)
Eosinophils Absolute: 0.3 10*3/uL (ref 0.0–0.5)
HCT: 39.6 % (ref 38.4–49.9)
HGB: 12.9 g/dL — ABNORMAL LOW (ref 13.0–17.1)
LYMPH%: 36.2 % (ref 14.0–49.0)
MCH: 28.7 pg (ref 27.2–33.4)
MCHC: 32.6 g/dL (ref 32.0–36.0)
MCV: 88.1 fL (ref 79.3–98.0)
MONO#: 0.4 10*3/uL (ref 0.1–0.9)
MONO%: 6.2 % (ref 0.0–14.0)
NEUT#: 3.1 10*3/uL (ref 1.5–6.5)
NEUT%: 53 % (ref 39.0–75.0)
Platelets: 136 10*3/uL — ABNORMAL LOW (ref 140–400)
RBC: 4.5 10*6/uL (ref 4.20–5.82)
RDW: 21.6 % — ABNORMAL HIGH (ref 11.0–14.6)
WBC: 5.9 10*3/uL (ref 4.0–10.3)
lymph#: 2.1 10*3/uL (ref 0.9–3.3)

## 2016-11-08 LAB — COMPREHENSIVE METABOLIC PANEL
ALT: 74 U/L — ABNORMAL HIGH (ref 0–55)
AST: 58 U/L — ABNORMAL HIGH (ref 5–34)
Albumin: 3.7 g/dL (ref 3.5–5.0)
Alkaline Phosphatase: 83 U/L (ref 40–150)
Anion Gap: 12 mEq/L — ABNORMAL HIGH (ref 3–11)
BUN: 7.9 mg/dL (ref 7.0–26.0)
CO2: 24 mEq/L (ref 22–29)
Calcium: 9.2 mg/dL (ref 8.4–10.4)
Chloride: 104 mEq/L (ref 98–109)
Creatinine: 1.1 mg/dL (ref 0.7–1.3)
EGFR: >90 mL/min/{1.73_m2} (ref >90)
Glucose: 176 mg/dl — ABNORMAL HIGH (ref 70–140)
Potassium: 3.7 mEq/L (ref 3.5–5.1)
Sodium: 140 mEq/L (ref 136–145)
Total Bilirubin: 0.61 mg/dL (ref 0.20–1.20)
Total Protein: 7.3 g/dL (ref 6.4–8.3)

## 2016-11-08 LAB — CEA (IN HOUSE-CHCC): CEA (CHCC-In House): <1 ng/mL (ref 0.00–5.00)

## 2016-11-08 NOTE — Telephone Encounter (Signed)
Scheduled appt per 6/26 los - Gave patient AVS and calender per los.  

## 2016-11-08 NOTE — Progress Notes (Signed)
[place] OFFICE PROGRESS NOTE   Diagnosis: Colon cancer  INTERVAL HISTORY:   Jeff [name] returns as scheduled. He continues Xeloda 7 days on/7 days off. No mouth sores, diarrhea, or hand/foot pain. Neuropathy symptoms have improved partially. He has intermittent discomfort at the right lower abdomen colostomy scar.  Objective:  Vital signs in last 24 hours:  Blood pressure 131/76, pulse 88, temperature 97.9 F (36.6 C), temperature source Oral, resp. rate 18, height _0  (1.778 m), weight 260 lb 4.8 oz (118.1 kg), SpO2 100 %.    HEENT: No thrush or ulcers Resp: Lungs clear bilaterally Cardio: Regular rate and rhythm GI: No hepatosplenomegaly Vascular: No leg edema  Skin: Hyperpigmentation of the palms and soles without erythema or skin breakdown   Portacath/PICC-without erythema  Lab Results:  Lab Results  Component Value Date   WBC 5.9 11/08/2016   HGB 12.9 (L) 11/08/2016   HCT 39.6 11/08/2016   MCV 88.1 11/08/2016   PLT 136 (L) 11/08/2016   NEUTROABS 3.1 11/08/2016    CMP     Component Value Date/Time   NA 140 11/08/2016 0941   K 3.7 11/08/2016 0941   CL 98 (L) 06/27/2016 1331   CO2 24 11/08/2016 0941   GLUCOSE 176 (H) 11/08/2016 0941   BUN 7.9 11/08/2016 0941   CREATININE 1.1 11/08/2016 0941   CALCIUM 9.2 11/08/2016 0941   PROT 7.3 11/08/2016 0941   ALBUMIN 3.7 11/08/2016 0941   AST 58 (H) 11/08/2016 0941   ALT 74 (H) 11/08/2016 0941   ALKPHOS 83 11/08/2016 0941   BILITOT 0.61 11/08/2016 0941   GFRNONAA 59 (L) 06/27/2016 1331   GFRAA >60 06/27/2016 1331    Lab Results  Component Value Date   CEA1 <1.00 10/18/2016     Medications: I have reviewed the patient's current medications.  Assessment/Plan: 1. 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 CTschest, abdomen, and pelvis on 12/11/2015-indeterminate tiny pulmonary nodules, multiple  liver metastases, small nodes in the sigmoid mesocolon  3. Laparoscopic sigmoid colectomy, liver biopsy, Port-A-Cath placement 01/14/2016  4. Pathology sigmoid colon resection-colonic adenocarcinoma, 5.3 cm extending into pericolonic connective tissue, positive lymph-vascular invasion, positive perineural invasion, negative margins, metastatic carcinoma in9 of 28 lymph nodes  5. Pathology liver biopsy-metastatic colorectal adenocarcinoma  6. MSI stable; mismatch repair protein normal  7. 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 Cycle 1 FOLFOX 02/02/2016  9. Cycle 2 FOLFOX 02/15/2016  10. Cycle 3 FOLFOX 02/29/2016  11. Cycle 4 FOLFOX 03/14/2016  12. Cycle 5 FOLFOX 03/28/2016  13. Cycle 6 FOLFOX 04/11/2016 (oxaliplatin held secondary to thrombocytopenia)  14. 04/21/2016 restaging CTs-difficulty evaluating liver lesions due to hepatic steatosis. Stable right adrenal nodule. No adenopathy or local recurrence near the rectosigmoid anastomotic site.  15. Cycle 7 FOLFOX 04/25/2016  16. MRI liver 05/02/2016-partial improvement in hepatic metastases  17. Cycle 8 FOLFOX 05/10/2016  18. Cycle 9 FOLFOX 05/23/2016 (oxaliplatin held due to thrombocytopenia)  19. Cycle 10 FOLFOX 06/06/2016 (oxaliplatin dose reduced due to thrombocytopenia)  20. Cycle 11 FOLFOX 06/27/2016 (oxaliplatin held due to neuropathy)  21. Cycle 12 FOLFOX 07/11/2016 (oxaliplatin held) 22. Initiation of maintenance Xeloda 7 days on/7 days off 07/27/2016  2. Rectal bleeding and constipation secondary to #1  3. History of peripheral vascular disease, status post left lower extremity vascular bypass surgery in April 2017  4. History of nephrolithiasis  5. History of Graves' disease treated with radioactive  iodine  6. Anxiety/depression  7. Hypertension  8. Hospitalization 01/19/2016 with wound dehiscence status post secondary suture closure of abdominal wall  9. Thrombocytopeniasecondary  to chemotherapy-oxaliplatin held with cycle 6 and cycle 9 FOLFOX  10. Hyperglycemia 06/20/2016-diagnosed with diabetes, maintained on insulin    Disposition:  Jeff [name] appears stable. He will continue Xeloda. The CEA remains in the normal range. His insurance company declined the restaging MRI request. We are in the process of healing this.  Jeff [name] will return for an office visit and Port-A-Cath flush 12/05/2016.  15 minutes were spent with the patient today. The majority of the time was used for counseling and coordination of care.  Donneta Romberg, MD  11/08/2016  11:00 AM

## 2016-11-09 ENCOUNTER — Telehealth: Payer: Self-pay | Admitting: *Deleted

## 2016-11-09 NOTE — Telephone Encounter (Signed)
Notified central scheduling that MRI has been approved, requested they contact pt to schedule.

## 2016-11-14 ENCOUNTER — Encounter: Payer: Self-pay | Admitting: Family

## 2016-11-18 ENCOUNTER — Ambulatory Visit (HOSPITAL_COMMUNITY)
Admission: RE | Admit: 2016-11-18 | Discharge: 2016-11-18 | Disposition: A | Discharge status: Home / Self Care | Payer: Medicaid Other | Source: Ambulatory Visit | Attending: Nurse Practitioner | Admitting: Nurse Practitioner

## 2016-11-18 DIAGNOSIS — K760 Fatty (change of) liver, not elsewhere classified: Secondary | ICD-10-CM | POA: Diagnosis not present

## 2016-11-18 DIAGNOSIS — C189 Malignant neoplasm of colon, unspecified: Secondary | ICD-10-CM | POA: Diagnosis present

## 2016-11-18 DIAGNOSIS — C787 Secondary malignant neoplasm of liver and intrahepatic bile duct: Secondary | ICD-10-CM | POA: Diagnosis not present

## 2016-11-18 MED ORDER — GADOBENATE DIMEGLUMINE 529 MG/ML IV SOLN
20.0000 mL | Freq: Once | INTRAVENOUS | Status: AC | PRN
  Administered 2016-11-18: 20 mL via INTRAVENOUS

## 2016-11-21 ENCOUNTER — Telehealth: Payer: Self-pay

## 2016-11-21 NOTE — Telephone Encounter (Signed)
Call placed to patient. Unable to contact.

## 2016-11-21 NOTE — Telephone Encounter (Signed)
-----   Message from Ladell Pier, MD sent at 11/19/2016  2:39 PM EDT ----- Please call patient, liver metastases are smaller, no new lesions, continue current therapy

## 2016-11-22 MED FILL — NOVOLOG FLEXPEN SYRINGE: 100 | 30 days supply | Qty: 30 | Fill #1

## 2016-11-23 NOTE — Telephone Encounter (Signed)
Attempted to call patient with results of recent MR I of liver. Per Dr. Payton Emerald mets are smaller, no new lesions and he is to continue current therapy.  No answer. VM left for pt to call back for above information.

## 2016-11-23 NOTE — Telephone Encounter (Signed)
Received call back from pt's wife. Relayed message to her regarding MRI results etc. She voiced understanding and she will share information with Mr. Vaness.  No questions or concerns at this time.

## 2016-11-28 ENCOUNTER — Ambulatory Visit (INDEPENDENT_AMBULATORY_CARE_PROVIDER_SITE_OTHER): Payer: Medicaid Other | Admitting: Family

## 2016-11-28 ENCOUNTER — Encounter: Payer: Self-pay | Admitting: Family

## 2016-11-28 ENCOUNTER — Ambulatory Visit (HOSPITAL_COMMUNITY)
Admission: RE | Admit: 2016-11-28 | Discharge: 2016-11-28 | Disposition: A | Discharge status: Home / Self Care | Payer: Medicaid Other | Source: Ambulatory Visit | Attending: Vascular Surgery | Admitting: Vascular Surgery

## 2016-11-28 ENCOUNTER — Ambulatory Visit (INDEPENDENT_AMBULATORY_CARE_PROVIDER_SITE_OTHER)
Admission: RE | Admit: 2016-11-28 | Discharge: 2016-11-28 | Disposition: A | Discharge status: Home / Self Care | Payer: Medicaid Other | Source: Ambulatory Visit | Attending: Vascular Surgery | Admitting: Vascular Surgery

## 2016-11-28 VITALS — BP 132/90 | HR 104 | Temp 97.7°F | Resp 20 | Ht 70.0 in | Wt 261.0 lb

## 2016-11-28 DIAGNOSIS — I70212 Atherosclerosis of native arteries of extremities with intermittent claudication, left leg: Secondary | ICD-10-CM | POA: Diagnosis not present

## 2016-11-28 DIAGNOSIS — Z95828 Presence of other vascular implants and grafts: Secondary | ICD-10-CM | POA: Diagnosis not present

## 2016-11-28 NOTE — Patient Instructions (Signed)

## 2016-11-28 NOTE — Progress Notes (Signed)
[facility] [place]   CC: Follow up peripheral artery occlusive disease  History of Present Illness Jeff [name] is a 51 y.o. male who returns today for follow-up.  The patient has a long-standing history of left leg claudication, beginning at approximately age 25.  His symptoms were initially attributed to lower back issues, however angiography by Dr. Fletcher Anon revealed a focal occlusion within the left popliteal artery.  An attempted percutaneous revascularization was performed, but was unsuccessful.  This was done through an antegrade approach and the patient ended up with a large scrotal hematoma.  On 09/11/2015 he underwent a left above-knee to tibioperoneal trunk bypass graft with ipsilateral non-reversed greater saphenous vein.  He had an excellent vein measuring approximately 5 mm.  He has an early takeoff of the anterior tibial artery.  The patient does complain of some numbness in the lower part of his leg.  Unfortunately in late 2017 he was diagnosed with metastatic colon cancer.  He has undergone colectomy and has completed chemotherapy.  He is maintained on oral medication currently.  He does appear to be recovering from this quite well.  From a cardiac perspective, the patient has a history of an MI but at coronary angiography this was attributed to coronary spasm. He is medically managed for hypercholesterolemia with a statin. He is medically managed for hypertension. He is a nonsmoker.  The pt was last evaluated by Dr. Trula Slade on 10-24-16. At that time the patient was status post left leg bypass for severe claudication and known popliteal occlusion. When Dr. Trula Slade saw him in June of 2017 he recommended surveillance ultrasound imaging of his bypass graft.  Medicaid has requested that [facility] evaluate the patient again before approval of the ultrasound is granted.  The patient needs routine surveillance imaging of his bypass graft based on our protocol.  Dr. Trula Slade  advised that  pt return within the next 4-6 weeks with a duplex of the bypass graft as well as ABIs, and pt returns today for this.  Jeff [name] denies any claudication symptoms in his legs with walking.   He denies any known history of stroke or TIA.   Pt Diabetic: Yes, last A1C result on file was 6.8 on 09-19-16.  Pt smoker: non-smoker  Pt meds include: Statin :Yes Betablocker: Yes ASA: Yes Other anticoagulants/antiplatelets: no  Past Medical History:  Diagnosis Date  . Allergic rhinitis   . Anxiety   . Arthritis   . Asthma   . At risk for sleep apnea    STOP-BANG= 6       SENT TO PCP 01-22-2015  . Chronic back pain    "from the neck to the lower back"   . 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)    left popliteal behind knee  w/ collaterals/  05-28-2014  attempted unsuccessful angioplasty  . Depression   . Dysthymic disorder   . ED (erectile dysfunction)   . GERD (gastroesophageal reflux disease)   . History of acute pyelonephritis    01-07-2015  . History of Graves' disease    vs  Multinodular  . History of hiatal hernia   . History of kidney stones   . History of non-ST elevation myocardial infarction (NSTEMI)    Jan 2014--  no CAD;  per notes probable coronary vasospasm  . History of panic attacks   . History of septic shock    01-07-2015--  ureterolithias/ pyelonephritis  . History of thyroid storm  Nov 2011  . Hyperlipidemia   . Hypertension   . Hypothyroidism following radioiodine therapy    RAI in Mar 2012---  followed by dr Loanne Drilling  . PAD (peripheral artery disease) [facility]) cardiologist-  dr Fletcher Anon   a.  ABI (01/2014):  L 0.53; R 1.0 >> referred to [facility]  . Right ureteral stone   . Septic shock ([place]) 01/05/2015  . Sleep disturbance   . Thrombocytopenia [facility])     Social History Social History  Substance Use Topics  . Smoking status: Never Smoker  . Smokeless tobacco: Never Used  . Alcohol use 0.0 oz/week     Comment: RARE    Family  History Family History  Problem Relation Age of Onset  . Thyroid disease Mother        hypothyroidism  . Heart attack Maternal Grandfather   . Heart Problems Father        pacermaker  . Edema Father   . Heart disease Maternal Grandmother   . Lung cancer Maternal Grandmother   . Diabetes Maternal Grandmother   . Hypertension Maternal Grandmother     Past Surgical History:  Procedure Laterality Date  . ABDOMINAL AORTAGRAM N/A 02/19/2014   Procedure: ABDOMINAL Maxcine Ham;  Surgeon: Wellington Hampshire, MD;  Location: [facility] CATH LAB;  Service: Cardiovascular;  Laterality: N/A;  . ANTERIOR CERVICAL DECOMP/DISCECTOMY FUSION  09-08-2010   C3 -4  . BACK SURGERY     x5, LOWER X 3, UPPER NECK X 2  . CYSTOSCOPY W/ URETERAL STENT PLACEMENT Right 01/04/2015   Procedure: CYSTOSCOPY WITH RETROGRADE PYELOGRAM/URETERAL STENT PLACEMENT;  Surgeon: Fr[place]ot Gallo, MD;  Location: [facility];  Service: Urology;  Laterality: Right;  . CYSTOSCOPY W/ URETERAL STENT REMOVAL Right 01/26/2015   Procedure: CYSTOSCOPY WITH STENT REMOVAL;  Surgeon: Fr[place]ot Gallo, MD;  Location: [facility];  Service: Urology;  Laterality: Right;  . CYSTOSCOPY/URETEROSCOPY/HOLMIUM LASER/STENT PLACEMENT Right 01/26/2015   Procedure: CYSTOSCOPY/URETEROSCOPY RIGHT;  Surgeon: Fr[place]ot Gallo, MD;  Location: [facility];  Service: Urology;  Laterality: Right;  . FEMORAL-POPLITEAL BYPASS GRAFT Left 09/11/2015   Procedure: BYPASS GRAFT LEFT ABOVE KNEE TO BELOW KNEE POPLITEAL ARTERY WITH LEFT GREATER SAPHENOUS VEIN;  Surgeon: Serafina Mitchell, MD;  Location: [place];  Service: Vascular;  Laterality: Left;  . INCISIONAL HERNIA REPAIR N/A 01/19/2016   Procedure: HERNIA REPAIR INCISIONAL;  Surgeon: Leighton Ruff, MD;  Location: [facility];  Service: General;  Laterality: N/A;  . LAMINECTOMY AND MICRODISCECTOMY LUMBAR SPINE  03-26-2009   left L5 -- S1  . LAPAROSCOPIC SIGMOID COLECTOMY N/A 01/14/2016   Procedure: LAPAROSCOPIC  SIGMOID COLECTOMY;  Surgeon: Leighton Ruff, MD;  Location: [facility];  Service: General;  Laterality: N/A;  . LEFT HEART CATHETERIZATION WITH CORONARY ANGIOGRAM N/A 06/12/2012   Procedure: LEFT HEART CATHETERIZATION WITH CORONARY ANGIOGRAM;  Surgeon: Josue Hector, MD;  Location: [facility] CATH LAB;  Service: Cardiovascular;  Laterality: N/A;   No sig. CAD/  normal LVF, ef 55-65%  . LIVER BIOPSY N/A 01/14/2016   Procedure: LIVER BIOPSY;  Surgeon: Leighton Ruff, MD;  Location: [facility];  Service: General;  Laterality: N/A;  . LOWER EXTREMITY ANGIOGRAM Left 05/28/2014   Procedure: LOWER EXTREMITY ANGIOGRAM;  Surgeon: Wellington Hampshire, MD;  Location: [facility] CATH LAB;  Service: Cardiovascular;  Laterality: Left;  Failed PTA CTO  . POPLITEAL ARTERY ANGIOPLASTY Left 05/28/2014    dr Fletcher Anon   Attempted and unsuccessful due to inability to cross the occlusionnotes   . PORTACATH PLACEMENT Right 01/14/2016  Procedure: INSERTION PORT-A-CATH;  Surgeon: Leighton Ruff, MD;  Location: [facility];  Service: General;  Laterality: Right;  . POSTERIOR CERVICAL FUSION/FORAMINOTOMY N/A 12/10/2012   Procedure: POSTERIOR CERVICAL FUSION/FORAMINOTOMY LEVEL 1;  Surgeon: Ophelia Charter, MD;  Location: [facility] NEURO ORS;  Service: Neurosurgery;  Laterality: N/A;  Cervical three-four posterior cervical fusion with lateral mass screws  . POSTERIOR LUMBAR FUSION  11-11-2009;   07-15-2013   L5 -- S1;   L4-- S1  . SHOULDER ARTHROSCOPY Right 03-08-2004   debridement labral tear/  DCR/  acromioplasty  . TRANSTHORACIC ECHOCARDIOGRAM  01-05-2015   mild LVH/  ef 60-65%/  mild TR  . UMBILICAL HERNIA REPAIR  1980  . VEIN HARVEST Left 09/11/2015   Procedure: LEFT GREATER SAPHENOUS VEIN HARVEST;  Surgeon: Serafina Mitchell, MD;  Location: [facility];  Service: Vascular;  Laterality: Left;    Allergies  Allergen Reactions  . Hydrocodone Hives    Current Outpatient Prescriptions  Medication Sig Dispense Refill  . ACCU-CHEK SOFTCLIX LANCETS lancets USE  TID TO  CHECK BLOOD SUGAR LEVELS  0  . amLODipine (NORVASC) 5 MG tablet Take 1 tablet (5 mg total) by mouth daily. 90 tablet 3  . aspirin EC 81 MG tablet Take 81 mg by mouth daily.    Marland Kitchen atorvastatin (LIPITOR) 80 MG tablet Take 1 tablet (80 mg total) by mouth every evening. 90 tablet 3  . capecitabine (XELODA) 500 MG tablet Take four tablets (2000 mg) every morning and four tablets (2000 mg) every evening with food. Take days 1-7 and days 15-21, every 28 days. 112 tablet 0  . docusate sodium (COLACE) 100 MG capsule Take 100 mg by mouth 2 (two) times daily.    . insulin aspart (NOVOLOG FLEXPEN) 100 UNIT/ML FlexPen Inject 50 Units into the skin 2 (two) times daily with a meal. 45 mL 5  . levothyroxine (SYNTHROID, LEVOTHROID) 150 MCG tablet Take 150 mcg by mouth daily.  3  . lidocaine-prilocaine (EMLA) cream Apply 1 application topically as needed. Apply to portacath site 1 hour prior to use 30 g 3  . meclizine (ANTIVERT) 25 MG tablet Take 25 mg by mouth 3 (three) times daily as needed.  1  . metFORMIN (GLUCOPHAGE-XR) 500 MG 24 hr tablet Take 1,000 mg by mouth daily. With evening meal  3  . metoprolol (LOPRESSOR) 50 MG tablet Take 0.5 tablets (25 mg total) by mouth 2 (two) times daily. 90 tablet 3  . nitroGLYCERIN (NITROSTAT) 0.4 MG SL tablet PLACE 1 TABLET UNDER THE TONGUE EVERY 5 MINUTES AS NEEDED FOR CHEST PAIN. 25 tablet 0  . omega-3 acid ethyl esters (LOVAZA) 1 g capsule Take 1 capsule (1 g total) by mouth 2 (two) times daily. 60 capsule 5  . oxyCODONE-acetaminophen (PERCOCET) 10-325 MG tablet Take 1 tablet by mouth every 6 (six) hours as needed for pain (for back pain). 30 tablet 0  . pantoprazole (PROTONIX) 40 MG tablet Take 40 mg by mouth daily.   5  . prochlorperazine (COMPAZINE) 10 MG tablet Take 1 tablet (10 mg total) by mouth every 6 (six) hours as needed for nausea or vomiting. 30 tablet 3  . promethazine (PHENERGAN) 12.5 MG tablet Take 12.5 mg by mouth every 6 (six) hours as needed.  3  .  sildenafil (REVATIO) 20 MG tablet Take 20-100 mg by mouth daily as needed for erectile dysfunction.  11  . tamsulosin (FLOMAX) 0.4 MG CAPS capsule Take 0.4 mg by mouth every evening.     Marland Kitchen  temazepam (RESTORIL) 30 MG capsule Take 30 mg by mouth at bedtime.  0  . tiZANidine (ZANAFLEX) 4 MG tablet Take 4 mg by mouth 2 (two) times daily as needed for muscle spasms.   2  . Venlafaxine HCl 225 MG TB24 Take 225 mg by mouth daily.  6   No current facility-administered medications for this visit.    Facility-Administered Medications Ordered in Other Visits  Medication Dose Route Frequency Provider Last Rate Last Dose  . alteplase (CATHFLO ACTIVASE) injection 2 mg  2 mg Intracatheter Once PRN Betsy Coder B, MD      . sodium chloride flush (NS) 0.9 % injection 10 mL  10 mL Intravenous PRN Ladell Pier, MD   10 mL at 02/04/16 1431  . sodium chloride flush (NS) 0.9 % injection 10 mL  10 mL Intravenous PRN Ladell Pier, MD   10 mL at 10/18/16 0938    ROS: See HPI for pertinent positives and negatives.   Physical Examination  Vitals:   11/28/16 1537 11/28/16 1538  BP: 115/84 132/90  Pulse: (!) 104   Resp: 20   Temp: 97.7 F (36.5 C)   TempSrc: Oral   SpO2: 97%   Weight: 261 lb (118.4 kg)   Height: 5\' 10"  (1.778 m)    Body mass index is 37.45 kg/m.  General: A&O x 3, WDWN, obese male. Gait: normal Eyes: PERRLA. Pulmonary: Respirations are non labored, CTAB, good air movement Cardiac: regular rhythm and rate, no detected murmur.         Carotid Bruits Right Left   Negative Negative   Radial pulses are 2+ palpable bilaterally   Adominal aortic pulse is not palpable                         VASCULAR EXAM: Extremities without ischemic changes, without Gangrene; without open wounds.                                                                                                          LE Pulses Right Left       FEMORAL  not palpable (obese)  not palpable        POPLITEAL   not palpable   not palpable       POSTERIOR TIBIAL  2+ palpable   2+ palpable        DORSALIS PEDIS      ANTERIOR TIBIAL 2+ palpable  2+ palpable    Abdomen: softly obese, NT, no palpable masses. Skin: no rashes, no ulcers noted. Acanthosis nigricans at posterior aspect of neck.  Musculoskeletal: no muscle wasting or atrophy.  Neurologic: A&O X 3; Appropriate Affect ; SENSATION: normal; MOTOR FUNCTION:  moving all extremities equally, motor strength 5/5 throughout. Speech is fluent/normal. CN 2-12 intact.    ASSESSMENT: Jeff [name] is a 51 y.o. male who is s/p left above-knee to tibioperoneal trunk bypass graft with ipsilateral non-reversed greater saphenous vein on 09/11/2015. He has no claudication symptoms with walking, bilateral pedal pulses are 2+  palpable.  Fortunately he has never used tobacco, more recently he was diagnosed with DM which is well controlled.  DATA  Left LE Arterial Duplex (11/28/16): Left above the knee tibioperoneal bypass with no areas of restenosis.  No change compared to the last exam on 10-27-15.    ABI (Date: 11/28/2016)  R:   ABI: 1.08 (was 1.2 on 11-04-15),   PT: tri  DP: tri  TBI:  0.78  L:   ABI: 1.17 (was 1.1),   PT: tri  DP: tri  TBI: 0.84  Bilateral ABI are normal with all triphasic waveforms, bilateral TBI are normal.     PLAN:  Based on the patient's vascular studies and examination, pt will return to clinic in 1 year with left LE arterial duplex and bilateral ABI.  I advised him to notify us if he develops concerns re the circulation in his feet or legs.   I discussed in depth with the patient the nature of atherosclerosis, and emphasized the importance of maximal medical management including strict control of blood pressure, blood glucose, and lipid levels, obtaining regular exercise, and continued cessation of smoking.  The patient is aware that without maximal medical management the underlying atherosclerotic  disease process will progress, limiting the benefit of any interventions.  The patient was given information about PAD including signs, symptoms, treatment, what symptoms should prompt the patient to seek immediate medical care, and risk reduction measures to take.  Clemon Chambers, RN, MSN, FNP-C [facility] Phone: [phone]  Clinic MD: Early on call  11/28/16 3:51 PM

## 2016-12-01 ENCOUNTER — Other Ambulatory Visit: Payer: Self-pay | Admitting: Oncology

## 2016-12-01 DIAGNOSIS — C187 Malignant neoplasm of sigmoid colon: Secondary | ICD-10-CM

## 2016-12-02 NOTE — Telephone Encounter (Signed)
error 

## 2016-12-05 ENCOUNTER — Other Ambulatory Visit: Payer: Self-pay | Admitting: *Deleted

## 2016-12-05 ENCOUNTER — Ambulatory Visit (HOSPITAL_BASED_OUTPATIENT_CLINIC_OR_DEPARTMENT_OTHER): Payer: Medicaid Other

## 2016-12-05 ENCOUNTER — Ambulatory Visit (HOSPITAL_BASED_OUTPATIENT_CLINIC_OR_DEPARTMENT_OTHER): Payer: Medicaid Other | Admitting: Nurse Practitioner

## 2016-12-05 ENCOUNTER — Other Ambulatory Visit: Payer: Medicaid Other

## 2016-12-05 ENCOUNTER — Telehealth: Payer: Self-pay | Admitting: Oncology

## 2016-12-05 VITALS — BP 131/88 | HR 87 | Temp 98.0°F | Resp 20 | Ht 70.0 in | Wt 261.8 lb

## 2016-12-05 DIAGNOSIS — R911 Solitary pulmonary nodule: Secondary | ICD-10-CM | POA: Diagnosis not present

## 2016-12-05 DIAGNOSIS — C189 Malignant neoplasm of colon, unspecified: Secondary | ICD-10-CM

## 2016-12-05 DIAGNOSIS — Z95828 Presence of other vascular implants and grafts: Secondary | ICD-10-CM

## 2016-12-05 DIAGNOSIS — C787 Secondary malignant neoplasm of liver and intrahepatic bile duct: Secondary | ICD-10-CM | POA: Diagnosis not present

## 2016-12-05 DIAGNOSIS — C187 Malignant neoplasm of sigmoid colon: Secondary | ICD-10-CM

## 2016-12-05 DIAGNOSIS — I10 Essential (primary) hypertension: Secondary | ICD-10-CM | POA: Diagnosis not present

## 2016-12-05 DIAGNOSIS — F418 Other specified anxiety disorders: Secondary | ICD-10-CM

## 2016-12-05 DIAGNOSIS — Z794 Long term (current) use of insulin: Secondary | ICD-10-CM

## 2016-12-05 DIAGNOSIS — E119 Type 2 diabetes mellitus without complications: Secondary | ICD-10-CM | POA: Diagnosis not present

## 2016-12-05 LAB — COMPREHENSIVE METABOLIC PANEL
ALT: 74 U/L — ABNORMAL HIGH (ref 0–55)
AST: 50 U/L — ABNORMAL HIGH (ref 5–34)
Albumin: 3.9 g/dL (ref 3.5–5.0)
Alkaline Phosphatase: 88 U/L (ref 40–150)
Anion Gap: 9 mEq/L (ref 3–11)
BUN: 8 mg/dL (ref 7.0–26.0)
CO2: 24 mEq/L (ref 22–29)
Calcium: 9.1 mg/dL (ref 8.4–10.4)
Chloride: 106 mEq/L (ref 98–109)
Creatinine: 1 mg/dL (ref 0.7–1.3)
EGFR: >90 mL/min/{1.73_m2} (ref >90)
Glucose: 158 mg/dl — ABNORMAL HIGH (ref 70–140)
Potassium: 3.9 mEq/L (ref 3.5–5.1)
Sodium: 140 mEq/L (ref 136–145)
Total Bilirubin: 0.57 mg/dL (ref 0.20–1.20)
Total Protein: 7.5 g/dL (ref 6.4–8.3)

## 2016-12-05 LAB — CBC WITH DIFFERENTIAL/PLATELET
BASO%: 0 % (ref 0.0–2.0)
Basophils Absolute: 0 10*3/uL (ref 0.0–0.1)
EOS%: 3.6 % (ref 0.0–7.0)
Eosinophils Absolute: 0.3 10*3/uL (ref 0.0–0.5)
HCT: 40.4 % (ref 38.4–49.9)
HGB: 12.9 g/dL — ABNORMAL LOW (ref 13.0–17.1)
LYMPH%: 42.7 % (ref 14.0–49.0)
MCH: 28.5 pg (ref 27.2–33.4)
MCHC: 31.9 g/dL — ABNORMAL LOW (ref 32.0–36.0)
MCV: 89.4 fL (ref 79.3–98.0)
MONO#: 0.5 10*3/uL (ref 0.1–0.9)
MONO%: 7.1 % (ref 0.0–14.0)
NEUT#: 3.2 10*3/uL (ref 1.5–6.5)
NEUT%: 46.6 % (ref 39.0–75.0)
Platelets: 146 10*3/uL (ref 140–400)
RBC: 4.52 10*6/uL (ref 4.20–5.82)
RDW: 18.1 % — ABNORMAL HIGH (ref 11.0–14.6)
WBC: 7 10*3/uL (ref 4.0–10.3)
lymph#: 3 10*3/uL (ref 0.9–3.3)

## 2016-12-05 MED ORDER — SODIUM CHLORIDE 0.9% FLUSH
10.0000 mL | INTRAVENOUS | Status: DC | PRN
  Administered 2016-12-05: 10 mL via INTRAVENOUS
  Filled 2016-12-05: qty 10

## 2016-12-05 MED ORDER — HEPARIN SOD (PORK) LOCK FLUSH 100 UNIT/ML IV SOLN
500.0000 [IU] | Freq: Once | INTRAVENOUS | Status: AC | PRN
  Administered 2016-12-05: 500 [IU] via INTRAVENOUS
  Filled 2016-12-05: qty 5

## 2016-12-05 MED ORDER — CAPECITABINE 500 MG PO TABS: ORAL_TABLET | ORAL | 0 refills | Status: DC

## 2016-12-05 MED FILL — CAPECITABINE 500 MG TABLET: 500 | 28 days supply | Qty: 112 | Fill #0

## 2016-12-05 NOTE — Patient Instructions (Signed)

## 2016-12-05 NOTE — Progress Notes (Signed)
[place] OFFICE PROGRESS NOTE   Diagnosis:  Colon cancer  INTERVAL HISTORY:   Mr. Chrismer returns as scheduled. He continues Xeloda 7 days on/7 days off. He denies nausea/vomiting. No mouth sores. No diarrhea. No hand or foot pain or redness.  Objective:  Vital signs in last 24 hours:  Blood pressure 131/88, pulse 87, temperature 98 F (36.7 C), temperature source Oral, resp. rate 20, height '5\' 10"'$  (1.778 m), weight 261 lb 12.8 oz (118.8 kg), SpO2 100 %.    HEENT: No thrush or ulcers. Resp: Lungs clear bilaterally. Cardio: Regular rate and rhythm. GI: Abdomen soft and nontender. No hepatomegaly. Vascular: No leg edema. Skin: Palms without erythema. Port-A-Cath without erythema.    Lab Results:  Lab Results  Component Value Date   WBC 7.0 12/05/2016   HGB 12.9 (L) 12/05/2016   HCT 40.4 12/05/2016   MCV 89.4 12/05/2016   PLT 146 12/05/2016   NEUTROABS 3.2 12/05/2016    Imaging:  No results found.  Medications: I have reviewed the patient's current medications.  Assessment/Plan: 1. 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 CTschest, abdomen, and pelvis on 12/11/2015-indeterminate tiny pulmonary nodules, multiple liver metastases, small nodes in the sigmoid mesocolon  3. Laparoscopic sigmoid colectomy, liver biopsy, Port-A-Cath placement 01/14/2016  4. Pathology sigmoid colon resection-colonic adenocarcinoma, 5.3 cm extending into pericolonic connective tissue, positive lymph-vascular invasion, positive perineural invasion, negative margins, metastatic carcinoma in9 of 28 lymph nodes  5. Pathology liver biopsy-metastatic colorectal adenocarcinoma  6. MSI stable; mismatch repair protein normal  7. 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 Cycle 1 FOLFOX 02/02/2016  9. Cycle 2 FOLFOX 02/15/2016  10. Cycle 3 FOLFOX 02/29/2016  11. Cycle 4  FOLFOX 03/14/2016  12. Cycle 5 FOLFOX 03/28/2016  13. Cycle 6 FOLFOX 04/11/2016 (oxaliplatin held secondary to thrombocytopenia)  14. 04/21/2016 restaging CTs-difficulty evaluating liver lesions due to hepatic steatosis. Stable right adrenal nodule. No adenopathy or local recurrence near the rectosigmoid anastomotic site.  15. Cycle 7 FOLFOX 04/25/2016  16. MRI liver 05/02/2016-partial improvement in hepatic metastases  17. Cycle 8 FOLFOX 05/10/2016  18. Cycle 9 FOLFOX 05/23/2016 (oxaliplatin held due to thrombocytopenia)  19. Cycle 10 FOLFOX 06/06/2016 (oxaliplatin dose reduced due to thrombocytopenia)  20. Cycle 11 FOLFOX 06/27/2016 (oxaliplatin held due to neuropathy)  21. Cycle 12 FOLFOX 07/11/2016 (oxaliplatin held) 22. Initiation of maintenance Xeloda 7 days on/7 days off 07/27/2016 23. MRI liver 11/18/2016-decrease in hepatic metastatic disease. No new or progressive disease identified within the abdomen. 24. Continuation of Xeloda 7 days on/7 days off  2. Rectal bleeding and constipation secondary to #1  3. History of peripheral vascular disease, status post left lower extremity vascular bypass surgery in April 2017  4. History of nephrolithiasis  5. History of Graves' disease treated with radioactive iodine  6. Anxiety/depression  7. Hypertension  8. Hospitalization 01/19/2016 with wound dehiscence status post secondary suture closure of abdominal wall  9. 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 Hyperglycemia 06/20/2016-diagnosed with diabetes, maintained on insulin   Disposition: Mr. Sainvil appears stable. The recent restaging MRI showed improvement in liver metastases. Plan to continue Xeloda 7 days on/7 days off. He will return for a follow-up visit in 4 weeks. He will contact the office in the interim with any problems.    Ned Card ANP/GNP-BC   12/05/2016  1:54 PM

## 2016-12-05 NOTE — Telephone Encounter (Signed)
Gave relative avs report and appointments for August.

## 2016-12-12 NOTE — Addendum Note (Signed)
Addended by: Lianne Cure A on: 12/12/2016 04:00 PM   Modules accepted: Orders

## 2016-12-13 ENCOUNTER — Other Ambulatory Visit: Payer: Self-pay | Admitting: Endocrinology

## 2017-01-05 ENCOUNTER — Telehealth: Payer: Self-pay | Admitting: Oncology

## 2017-01-05 ENCOUNTER — Other Ambulatory Visit (HOSPITAL_BASED_OUTPATIENT_CLINIC_OR_DEPARTMENT_OTHER): Payer: Medicaid Other

## 2017-01-05 ENCOUNTER — Other Ambulatory Visit: Payer: Self-pay | Admitting: *Deleted

## 2017-01-05 ENCOUNTER — Ambulatory Visit (HOSPITAL_BASED_OUTPATIENT_CLINIC_OR_DEPARTMENT_OTHER): Payer: Medicaid Other | Admitting: Oncology

## 2017-01-05 VITALS — BP 141/91 | HR 91 | Temp 98.5°F | Resp 20 | Ht 70.0 in | Wt 261.5 lb

## 2017-01-05 DIAGNOSIS — I10 Essential (primary) hypertension: Secondary | ICD-10-CM | POA: Diagnosis not present

## 2017-01-05 DIAGNOSIS — C187 Malignant neoplasm of sigmoid colon: Secondary | ICD-10-CM

## 2017-01-05 DIAGNOSIS — C787 Secondary malignant neoplasm of liver and intrahepatic bile duct: Secondary | ICD-10-CM

## 2017-01-05 DIAGNOSIS — D6959 Other secondary thrombocytopenia: Secondary | ICD-10-CM | POA: Diagnosis not present

## 2017-01-05 DIAGNOSIS — C189 Malignant neoplasm of colon, unspecified: Secondary | ICD-10-CM

## 2017-01-05 LAB — COMPREHENSIVE METABOLIC PANEL
ALT: 69 U/L — ABNORMAL HIGH (ref 0–55)
AST: 50 U/L — ABNORMAL HIGH (ref 5–34)
Albumin: 3.8 g/dL (ref 3.5–5.0)
Alkaline Phosphatase: 102 U/L (ref 40–150)
Anion Gap: 11 mEq/L (ref 3–11)
BUN: 9.5 mg/dL (ref 7.0–26.0)
CO2: 24 mEq/L (ref 22–29)
Calcium: 9.2 mg/dL (ref 8.4–10.4)
Chloride: 105 mEq/L (ref 98–109)
Creatinine: 1.2 mg/dL (ref 0.7–1.3)
EGFR: 80 mL/min/{1.73_m2} — ABNORMAL LOW (ref >90)
Glucose: 208 mg/dl — ABNORMAL HIGH (ref 70–140)
Potassium: 3.8 mEq/L (ref 3.5–5.1)
Sodium: 139 mEq/L (ref 136–145)
Total Bilirubin: 0.56 mg/dL (ref 0.20–1.20)
Total Protein: 7.7 g/dL (ref 6.4–8.3)

## 2017-01-05 LAB — CBC WITH DIFFERENTIAL/PLATELET
BASO%: 0 % (ref 0.0–2.0)
Basophils Absolute: 0 10*3/uL (ref 0.0–0.1)
EOS%: 3.3 % (ref 0.0–7.0)
Eosinophils Absolute: 0.2 10*3/uL (ref 0.0–0.5)
HCT: 42.9 % (ref 38.4–49.9)
HGB: 13.9 g/dL (ref 13.0–17.1)
LYMPH%: 39.8 % (ref 14.0–49.0)
MCH: 29.1 pg (ref 27.2–33.4)
MCHC: 32.4 g/dL (ref 32.0–36.0)
MCV: 89.9 fL (ref 79.3–98.0)
MONO#: 0.4 10*3/uL (ref 0.1–0.9)
MONO%: 5.6 % (ref 0.0–14.0)
NEUT#: 3.7 10*3/uL (ref 1.5–6.5)
NEUT%: 51.3 % (ref 39.0–75.0)
Platelets: 148 10*3/uL (ref 140–400)
RBC: 4.77 10*6/uL (ref 4.20–5.82)
RDW: 18.5 % — ABNORMAL HIGH (ref 11.0–14.6)
WBC: 7.3 10*3/uL (ref 4.0–10.3)
lymph#: 2.9 10*3/uL (ref 0.9–3.3)

## 2017-01-05 LAB — CEA (IN HOUSE-CHCC): CEA (CHCC-In House): <1 ng/mL (ref 0.00–5.00)

## 2017-01-05 MED ORDER — CAPECITABINE 500 MG PO TABS: ORAL_TABLET | ORAL | 0 refills | Status: DC

## 2017-01-05 MED ORDER — PROMETHAZINE HCL 12.5 MG PO TABS: 12.5000 mg | ORAL_TABLET | Freq: Four times a day (QID) | ORAL | 3 refills | Status: DC | PRN

## 2017-01-05 MED FILL — NOVOLOG FLEXPEN SYRINGE: 100 | 30 days supply | Qty: 30 | Fill #2

## 2017-01-05 MED FILL — CAPECITABINE 500 MG TABLET: 500 | 28 days supply | Qty: 112 | Fill #0

## 2017-01-05 MED FILL — PROMETHAZINE 12.5 MG TABLET: 12.5 | 8 days supply | Qty: 30 | Fill #0

## 2017-01-05 NOTE — Progress Notes (Signed)
[facility] OFFICE PROGRESS NOTE   Diagnosis: Colon cancer  INTERVAL HISTORY:   Jeff [name] returns as scheduled. He continues Xeloda. No mouth sores, hand/foot pain, or diarrhea. He reports nausea that is relieved with Phenergan. Good appetite.  Objective:  Vital signs in last 24 hours:  Blood pressure (!) 141/91, pulse 91, temperature 98.5 F (36.9 C), resp. rate 20, height '5\' 10"'$  (1.778 m), weight 261 lb 8 oz (118.6 kg), SpO2 97 %.    HEENT: No thrush or ulcers Resp: Lungs clear bilaterally Cardio: Regular rate and rhythm GI: No hepatosplenomegaly, nontender Vascular: No leg edema  Skin: Mild hyperpigmentation of the hands and feet   Portacath/PICC-without erythema  Lab Results:  Lab Results  Component Value Date   WBC 7.3 01/05/2017   HGB 13.9 01/05/2017   HCT 42.9 01/05/2017   MCV 89.9 01/05/2017   PLT 148 01/05/2017   NEUTROABS 3.7 01/05/2017    CMP     Component Value Date/Time   NA 139 01/05/2017 1030   K 3.8 01/05/2017 1030   CL 98 (L) 06/27/2016 1331   CO2 24 01/05/2017 1030   GLUCOSE 208 (H) 01/05/2017 1030   BUN 9.5 01/05/2017 1030   CREATININE 1.2 01/05/2017 1030   CALCIUM 9.2 01/05/2017 1030   PROT 7.7 01/05/2017 1030   ALBUMIN 3.8 01/05/2017 1030   AST 50 (H) 01/05/2017 1030   ALT 69 (H) 01/05/2017 1030   ALKPHOS 102 01/05/2017 1030   BILITOT 0.56 01/05/2017 1030   GFRNONAA 59 (L) 06/27/2016 1331   GFRAA >60 06/27/2016 1331    Lab Results  Component Value Date   CEA1 <1.00 11/08/2016     Medications: I have reviewed the patient's current medications.  Assessment/Plan: 1. 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 CTschest, abdomen, and pelvis on 12/11/2015-indeterminate tiny pulmonary nodules, multiple liver metastases, small nodes in the sigmoid mesocolon  3. Laparoscopic sigmoid colectomy, liver biopsy, Port-A-Cath  placement 01/14/2016  4. Pathology sigmoid colon resection-colonic adenocarcinoma, 5.3 cm extending into pericolonic connective tissue, positive lymph-vascular invasion, positive perineural invasion, negative margins, metastatic carcinoma in9 of 28 lymph nodes  5. Pathology liver biopsy-metastatic colorectal adenocarcinoma  6. MSI stable; mismatch repair protein normal  7. 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 Cycle 1 FOLFOX 02/02/2016  9. Cycle 2 FOLFOX 02/15/2016  10. Cycle 3 FOLFOX 02/29/2016  11. Cycle 4 FOLFOX 03/14/2016  12. Cycle 5 FOLFOX 03/28/2016  13. Cycle 6 FOLFOX 04/11/2016 (oxaliplatin held secondary to thrombocytopenia)  14. 04/21/2016 restaging CTs-difficulty evaluating liver lesions due to hepatic steatosis. Stable right adrenal nodule. No adenopathy or local recurrence near the rectosigmoid anastomotic site.  15. Cycle 7 FOLFOX 04/25/2016  16. MRI liver 05/02/2016-partial improvement in hepatic metastases  17. Cycle 8 FOLFOX 05/10/2016  18. Cycle 9 FOLFOX 05/23/2016 (oxaliplatin held due to thrombocytopenia)  19. Cycle 10 FOLFOX 06/06/2016 (oxaliplatin dose reduced due to thrombocytopenia)  20. Cycle 11 FOLFOX 06/27/2016 (oxaliplatin held due to neuropathy)  21. Cycle 12 FOLFOX 07/11/2016 (oxaliplatin held) 22. Initiation of maintenance Xeloda 7 days on/7 days off 07/27/2016 23. MRI liver 11/18/2016-decrease in hepatic metastatic disease. No new or progressive disease identified within the abdomen. 24. Continuation of Xeloda 7 days on/7 days off  2. Rectal bleeding and constipation secondary to #1  3. History of peripheral vascular disease, status post left lower extremity vascular bypass surgery in April 2017  4. History of nephrolithiasis  5. History of Graves' disease treated with radioactive iodine  6. Anxiety/depression  7. Hypertension  8. Hospitalization 01/19/2016 with wound dehiscence status post secondary suture  closure of abdominal wall  9. 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 Hyperglycemia 06/20/2016-diagnosed with diabetes, maintained on insulin  Disposition:  Jeff [name] appears stable. He is tolerating the Xeloda well. He will continue Xeloda 7 days on/7 days off. He will return for an office visit and Port-A-Cath flush on 01/26/2017.  15 minutes were spent with the patient today. The majority of the time was used for counseling and coordination of care.  Donneta Romberg, MD  01/05/2017  11:22 AM

## 2017-01-05 NOTE — Telephone Encounter (Signed)
Gave patient AVS and calendar of upcoming September appointment. °

## 2017-01-10 ENCOUNTER — Encounter: Payer: Self-pay | Admitting: Internal Medicine

## 2017-01-18 ENCOUNTER — Other Ambulatory Visit: Payer: Self-pay | Admitting: Endocrinology

## 2017-01-20 ENCOUNTER — Ambulatory Visit: Payer: Medicaid Other | Admitting: Endocrinology

## 2017-01-24 ENCOUNTER — Other Ambulatory Visit: Payer: Self-pay | Admitting: Cardiovascular Disease

## 2017-01-24 NOTE — Telephone Encounter (Signed)
Refill Request.  

## 2017-01-25 ENCOUNTER — Other Ambulatory Visit: Payer: Self-pay | Admitting: Oncology

## 2017-01-25 DIAGNOSIS — C187 Malignant neoplasm of sigmoid colon: Secondary | ICD-10-CM

## 2017-01-26 ENCOUNTER — Ambulatory Visit: Payer: Medicaid Other

## 2017-01-26 ENCOUNTER — Other Ambulatory Visit (HOSPITAL_BASED_OUTPATIENT_CLINIC_OR_DEPARTMENT_OTHER): Payer: Medicaid Other

## 2017-01-26 ENCOUNTER — Ambulatory Visit: Payer: Medicaid Other | Admitting: Endocrinology

## 2017-01-26 ENCOUNTER — Telehealth: Payer: Self-pay | Admitting: Nurse Practitioner

## 2017-01-26 ENCOUNTER — Ambulatory Visit (HOSPITAL_BASED_OUTPATIENT_CLINIC_OR_DEPARTMENT_OTHER): Payer: Medicaid Other | Admitting: Nurse Practitioner

## 2017-01-26 VITALS — BP 138/83 | HR 83 | Temp 98.0°F | Resp 18 | Ht 70.0 in | Wt 262.3 lb

## 2017-01-26 DIAGNOSIS — Z95828 Presence of other vascular implants and grafts: Secondary | ICD-10-CM

## 2017-01-26 DIAGNOSIS — C189 Malignant neoplasm of colon, unspecified: Secondary | ICD-10-CM

## 2017-01-26 DIAGNOSIS — C187 Malignant neoplasm of sigmoid colon: Secondary | ICD-10-CM | POA: Diagnosis present

## 2017-01-26 DIAGNOSIS — C787 Secondary malignant neoplasm of liver and intrahepatic bile duct: Secondary | ICD-10-CM

## 2017-01-26 LAB — COMPREHENSIVE METABOLIC PANEL
ALT: 55 U/L (ref 0–55)
AST: 37 U/L — ABNORMAL HIGH (ref 5–34)
Albumin: 3.9 g/dL (ref 3.5–5.0)
Alkaline Phosphatase: 112 U/L (ref 40–150)
Anion Gap: 10 mEq/L (ref 3–11)
BUN: 8.2 mg/dL (ref 7.0–26.0)
CO2: 23 mEq/L (ref 22–29)
Calcium: 9.3 mg/dL (ref 8.4–10.4)
Chloride: 108 mEq/L (ref 98–109)
Creatinine: 1.1 mg/dL (ref 0.7–1.3)
EGFR: 87 mL/min/{1.73_m2} — ABNORMAL LOW (ref >90)
Glucose: 143 mg/dl — ABNORMAL HIGH (ref 70–140)
Potassium: 3.6 mEq/L (ref 3.5–5.1)
Sodium: 141 mEq/L (ref 136–145)
Total Bilirubin: 0.47 mg/dL (ref 0.20–1.20)
Total Protein: 7.7 g/dL (ref 6.4–8.3)

## 2017-01-26 LAB — CBC WITH DIFFERENTIAL/PLATELET
BASO%: 0.2 % (ref 0.0–2.0)
Basophils Absolute: 0 10*3/uL (ref 0.0–0.1)
EOS%: 1.6 % (ref 0.0–7.0)
Eosinophils Absolute: 0.1 10*3/uL (ref 0.0–0.5)
HCT: 41.2 % (ref 38.4–49.9)
HGB: 13.4 g/dL (ref 13.0–17.1)
LYMPH%: 35.3 % (ref 14.0–49.0)
MCH: 29.3 pg (ref 27.2–33.4)
MCHC: 32.6 g/dL (ref 32.0–36.0)
MCV: 89.8 fL (ref 79.3–98.0)
MONO#: 0.5 10*3/uL (ref 0.1–0.9)
MONO%: 6.8 % (ref 0.0–14.0)
NEUT#: 4.5 10*3/uL (ref 1.5–6.5)
NEUT%: 56.1 % (ref 39.0–75.0)
Platelets: 138 10*3/uL — ABNORMAL LOW (ref 140–400)
RBC: 4.59 10*6/uL (ref 4.20–5.82)
RDW: 22.3 % — ABNORMAL HIGH (ref 11.0–14.6)
WBC: 8 10*3/uL (ref 4.0–10.3)
lymph#: 2.8 10*3/uL (ref 0.9–3.3)

## 2017-01-26 MED ORDER — PROCHLORPERAZINE MALEATE 10 MG PO TABS: 10.0000 mg | ORAL_TABLET | Freq: Four times a day (QID) | ORAL | 3 refills | Status: DC | PRN

## 2017-01-26 MED ORDER — SODIUM CHLORIDE 0.9% FLUSH
10.0000 mL | INTRAVENOUS | Status: DC | PRN
  Administered 2017-01-26: 10 mL via INTRAVENOUS
  Filled 2017-01-26: qty 10

## 2017-01-26 MED ORDER — LIDOCAINE-PRILOCAINE 2.5-2.5 % EX CREA: 1.0000 "application " | TOPICAL_CREAM | CUTANEOUS | 3 refills | Status: DC | PRN

## 2017-01-26 MED ORDER — HEPARIN SOD (PORK) LOCK FLUSH 100 UNIT/ML IV SOLN
500.0000 [IU] | Freq: Once | INTRAVENOUS | Status: AC | PRN
  Administered 2017-01-26: 500 [IU] via INTRAVENOUS
  Filled 2017-01-26: qty 5

## 2017-01-26 NOTE — Telephone Encounter (Signed)
Gave avs and calendar for October  °

## 2017-01-26 NOTE — Progress Notes (Signed)
  [place] OFFICE PROGRESS NOTE   Diagnosis:  Colon cancer  INTERVAL HISTORY:   Mr. Vea returns as scheduled. He continues Xeloda. He feels well. He denies nausea. No mouth sores. No diarrhea. No hand or foot pain or redness. He continues to have a good appetite.  Objective:  Vital signs in last 24 hours:  Blood pressure 138/83, pulse 83, temperature 98 F (36.7 C), temperature source Oral, resp. rate 18, height '5\' 10"'$  (1.778 m), weight 262 lb 4.8 oz (119 kg), SpO2 100 %.    HEENT: No thrush or ulcers. Resp: Lungs clear bilaterally. Cardio: Regular rate and rhythm. GI: Abdomen soft and nontender. No hepatomegaly. Vascular: No leg edema. Skin: Palms without erythema. Port-A-Cath without erythema.    Lab Results:  Lab Results  Component Value Date   WBC 8.0 01/26/2017   HGB 13.4 01/26/2017   HCT 41.2 01/26/2017   MCV 89.8 01/26/2017   PLT 138 (L) 01/26/2017   NEUTROABS 4.5 01/26/2017    Imaging:  No results found.  Medications: I have reviewed the patient's current medications.  Assessment/Plan: 1. 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 CTschest, abdomen, and pelvis on 12/11/2015-indeterminate tiny pulmonary nodules, multiple liver metastases, small nodes in the sigmoid mesocolon  3. Laparoscopic sigmoid colectomy, liver biopsy, Port-A-Cath placement 01/14/2016  4. Pathology sigmoid colon resection-colonic adenocarcinoma, 5.3 cm extending into pericolonic connective tissue, positive lymph-vascular invasion, positive perineural invasion, negative margins, metastatic carcinoma in9 of 28 lymph nodes  5. Pathology liver biopsy-metastatic colorectal adenocarcinoma  6. MSI stable; mismatch repair protein normal  7. 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 Cycle 1 FOLFOX 02/02/2016  9. Cycle 2 FOLFOX 02/15/2016  10. Cycle 3 FOLFOX 02/29/2016   11. Cycle 4 FOLFOX 03/14/2016  12. Cycle 5 FOLFOX 03/28/2016  13. Cycle 6 FOLFOX 04/11/2016 (oxaliplatin held secondary to thrombocytopenia)  14. 04/21/2016 restaging CTs-difficulty evaluating liver lesions due to hepatic steatosis. Stable right adrenal nodule. No adenopathy or local recurrence near the rectosigmoid anastomotic site.  15. Cycle 7 FOLFOX 04/25/2016  16. MRI liver 05/02/2016-partial improvement in hepatic metastases  17. Cycle 8 FOLFOX 05/10/2016  18. Cycle 9 FOLFOX 05/23/2016 (oxaliplatin held due to thrombocytopenia)  19. Cycle 10 FOLFOX 06/06/2016 (oxaliplatin dose reduced due to thrombocytopenia)  20. Cycle 11 FOLFOX 06/27/2016 (oxaliplatin held due to neuropathy)  21. Cycle 12 FOLFOX 07/11/2016 (oxaliplatin held) 22. Initiation of maintenance Xeloda 7 days on/7 days off 07/27/2016 23. MRI liver 11/18/2016-decrease in hepatic metastatic disease. No new or progressive disease identified within the abdomen. 24. Continuation of Xeloda 7 days on/7 days off  2. Rectal bleeding and constipation secondary to #1  3. History of peripheral vascular disease, status post left lower extremity vascular bypass surgery in April 2017  4. History of nephrolithiasis  5. History of Graves' disease treated with radioactive iodine  6. Anxiety/depression  7. Hypertension  8. Hospitalization 01/19/2016 with wound dehiscence status post secondary suture closure of abdominal wall  9. 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 Hyperglycemia 06/20/2016-diagnosed with diabetes, maintained on insulin    Disposition: Jeff [name] appears well. There is no clinical evidence of disease progression. He will continue Xeloda 7 days on/7 days off. He will return for labs and a follow-up visit in 4 weeks.    Ned Card ANP/GNP-BC   01/26/2017  9:49 AM

## 2017-02-03 MED FILL — CAPECITABINE 500 MG TABLET: 500 | 28 days supply | Qty: 112 | Fill #0

## 2017-02-07 ENCOUNTER — Encounter: Payer: Self-pay | Admitting: Endocrinology

## 2017-02-07 ENCOUNTER — Ambulatory Visit (INDEPENDENT_AMBULATORY_CARE_PROVIDER_SITE_OTHER): Payer: Medicaid Other | Admitting: Endocrinology

## 2017-02-07 VITALS — BP 130/86 | HR 81 | Ht 70.0 in | Wt 263.0 lb

## 2017-02-07 DIAGNOSIS — Z23 Encounter for immunization: Secondary | ICD-10-CM

## 2017-02-07 DIAGNOSIS — Z794 Long term (current) use of insulin: Secondary | ICD-10-CM | POA: Diagnosis not present

## 2017-02-07 DIAGNOSIS — E890 Postprocedural hypothyroidism: Secondary | ICD-10-CM | POA: Diagnosis not present

## 2017-02-07 DIAGNOSIS — E1142 Type 2 diabetes mellitus with diabetic polyneuropathy: Secondary | ICD-10-CM | POA: Diagnosis not present

## 2017-02-07 LAB — T4, FREE: Free T4: 0.81 ng/dL (ref 0.60–1.60)

## 2017-02-07 LAB — TSH: TSH: 14.95 u[IU]/mL — ABNORMAL HIGH (ref 0.35–4.50)

## 2017-02-07 LAB — POCT GLYCOSYLATED HEMOGLOBIN (HGB A1C): Hemoglobin A1C: 6.5

## 2017-02-07 MED ORDER — CEPHALEXIN 250 MG PO CAPS: 250.0000 mg | ORAL_CAPSULE | Freq: Three times a day (TID) | ORAL | 0 refills | Status: DC

## 2017-02-07 MED ORDER — INSULIN ASPART 100 UNIT/ML FLEXPEN: PEN_INJECTOR | SUBCUTANEOUS | 5 refills | Status: DC

## 2017-02-07 NOTE — Patient Instructions (Addendum)
I have sent a prescription to your pharmacy, for an antibiotic pill. Loratadine-d (non-prescription) will help your congestion. blood tests are requested for you today.  We'll let you know about the results. Please decrease the insulin to 45 units twice a day (breakfast and supper) check your blood sugar twice a day.  vary the time of day when you check, between before the 3 meals, and at bedtime.  also check if you have symptoms of your blood sugar being too high or too low.  please keep a record of the readings and bring it to your next appointment here (or you can bring the meter itself).  You can write it on any piece of paper.  please call us sooner if your blood sugar goes below 70, or if you have a lot of readings over 200.  Please come back for a follow-up appointment in 4 months.

## 2017-02-07 NOTE — Progress Notes (Signed)
Subjective:    Patient ID: Jeff [name], male    DOB: 08/13/1965, 51 y.o.   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 Interval history: Pt says he skips the insulin when cbg is below 100.  This happens approx once per week.  no cbg record, but states cbg's vary from 98-141.  There is no trend throughout the day.  No recent steroids.  Pt also has post-RAI hypothyroidism (in 2012, he had RAI rx for hyperthyroidism, due to Grave's Dz vs multinodular goiter vs both).  Pt states 1 week of moderate pain at the right ear, and assoc nasal congestion.  No fever.   Past Medical History:  Diagnosis Date  . Allergic rhinitis   . Anxiety   . Arthritis   . Asthma   . At risk for sleep apnea    STOP-BANG= 6       SENT TO PCP 01-22-2015  . Chronic back pain    "from the neck to the lower back"   . 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 Depression   . Dysthymic disorder   . ED (erectile dysfunction)   . GERD (gastroesophageal reflux disease)   . History of acute pyelonephritis    01-07-2015  . History of Graves' disease    vs  Multinodular  . History of hiatal hernia   . History of kidney stones   . History of non-ST elevation myocardial infarction (NSTEMI)    Jan 2014--  no CAD;  per notes probable coronary vasospasm  . History of panic attacks   . History of septic shock    01-07-2015--  ureterolithias/ pyelonephritis  . History of thyroid storm    Nov 2011  . Hyperlipidemia   . Hypertension   . Hypothyroidism  following radioiodine therapy    RAI in Mar 2012---  followed by dr Loanne Drilling  . PAD (peripheral artery disease) [facility]) cardiologist-  dr Fletcher Anon   a.  ABI (01/2014):  L 0.53; R 1.0 >> referred to [facility]  . Right ureteral stone   . Septic shock ([place]) 01/05/2015  . Sleep disturbance   . Thrombocytopenia ([place])     Past Surgical History:  Procedure Laterality Date  . ABDOMINAL AORTAGRAM N/A 02/19/2014   Procedure: ABDOMINAL Maxcine Ham;  Surgeon: Wellington Hampshire, MD;  Location: [facility] CATH LAB;  Service: Cardiovascular;  Laterality: N/A;  . ANTERIOR CERVICAL DECOMP/DISCECTOMY FUSION  09-08-2010   C3 -4  . BACK SURGERY     x5, LOWER X 3, UPPER NECK X 2  . CYSTOSCOPY W/ URETERAL STENT PLACEMENT Right 01/04/2015   Procedure: CYSTOSCOPY WITH RETROGRADE PYELOGRAM/URETERAL STENT PLACEMENT;  Surgeon: Franchot Gallo, MD;  Location: [facility];  Service: Urology;  Laterality: Right;  . CYSTOSCOPY W/ URETERAL STENT REMOVAL Right 01/26/2015   Procedure: CYSTOSCOPY WITH STENT REMOVAL;  Surgeon: Franchot Gallo, MD;  Location[facility];  Service: Urology;  Laterality: Right;  . CYSTOSCOPY/URETEROSCOPY/HOLMIUM LASER/STENT PLACEMENT Right 01/26/2015   Procedure: CYSTOSCOPY/URETEROSCOPY RIGHT;  Surgeon: Franchot Gallo, MD;  Location[facility];  Service: Urology;  Laterality: Right;  . FEMORAL-POPLITEAL BYPASS GRAFT Left 09/11/2015   Procedure: BYPASS GRAFT LEFT ABOVE KNEE TO BELOW KNEE POPLITEAL ARTERY WITH LEFT GREATER SAPHENOUS VEIN;  Surgeon: Serafina Mitchell, MD;  Location: [facility];  Service: Vascular;  Laterality: Left;  . INCISIONAL HERNIA REPAIR N/A 01/19/2016   Procedure: HERNIA REPAIR INCISIONAL;  Surgeon: Leighton Ruff, MD;  Location: [facility];  Service: General;  Laterality: N/A;  . LAMINECTOMY AND MICRODISCECTOMY LUMBAR SPINE  03-26-2009   left L5 -- S1  . LAPAROSCOPIC SIGMOID COLECTOMY N/A 01/14/2016   Procedure: LAPAROSCOPIC SIGMOID COLECTOMY;  Surgeon: Leighton Ruff, MD;   Location: [facility];  Service: General;  Laterality: N/A;  . LEFT HEART CATHETERIZATION WITH CORONARY ANGIOGRAM N/A 06/12/2012   Procedure: LEFT HEART CATHETERIZATION WITH CORONARY ANGIOGRAM;  Surgeon: Josue Hector, MD;  Location: [facility] CATH LAB;  Service: Cardiovascular;  Laterality: N/A;   No sig. CAD/  normal LVF, ef 55-65%  . LIVER BIOPSY N/A 01/14/2016   Procedure: LIVER BIOPSY;  Surgeon: Leighton Ruff, MD;  Location: [facility];  Service: General;  Laterality: N/A;  . LOWER EXTREMITY ANGIOGRAM Left 05/28/2014   Procedure: LOWER EXTREMITY ANGIOGRAM;  Surgeon: Wellington Hampshire, MD;  Location: [facility] [facility] CATH LAB;  Service: Cardiovascular;  Laterality: Left;  Failed PTA CTO  . POPLITEAL ARTERY ANGIOPLASTY Left 05/28/2014    dr Fletcher Anon   Attempted and unsuccessful due to inability to cross the occlusionnotes   . PORTACATH PLACEMENT Right 01/14/2016   Procedure: INSERTION PORT-A-CATH;  Surgeon: Leighton Ruff, MD;  Location: [facility];  Service: General;  Laterality: Right;  . POSTERIOR CERVICAL FUSION/FORAMINOTOMY N/A 12/10/2012   Procedure: POSTERIOR CERVICAL FUSION/FORAMINOTOMY LEVEL 1;  Surgeon: Ophelia Charter, MD;  Location: [facility] NEURO OR[facility];  Service: Neurosurgery;  Laterality: N/A;  Cervical three-four posterior cervical fusion with lateral mass screws  . POSTERIOR LUMBAR FUSION  11-11-2009;   07-15-2013   L5 -- S1;   L4-- S1  . SHOULDER ARTHROSCOPY Right 03-08-2004   debridement labral tear/  DCR/  acromioplasty  . TRANSTHORACIC ECHOCARDIOGRAM  01-05-2015   mild LVH/  ef 60-65%/  mild TR  . UMBILICAL HERNIA REPAIR  1980  . VEIN HARVEST Left 09/11/2015   Procedure: LEFT GREATER SAPHENOUS VEIN HARVEST;  Surgeon: Serafina Mitchell, MD;  Location: [facility];  Service: Vascular;  Laterality: Left;    Social History   Social History  . Marital status: Married    Spouse name: N/A  . Number of children: 5  . Years of education: N/A   Occupational History  . Disabled Unemployed   Social History Main Topics  .  Smoking status: Never Smoker  . Smokeless tobacco: Never Used  . Alcohol use 0.0 oz/week     Comment: RARE  . Drug use: No  . Sexual activity: Not on file   Other Topics Concern  . Not on file   Social History Narrative   Married - wife works Surveyor, quantity unit @ [employer]   5 daughters   Regular exercise-no   Disabled to due back problem   12/04/2015       Current Outpatient Prescriptions on File Prior to Visit  Medication Sig Dispense Refill  . ACCU-CHEK SOFTCLIX LANCETS lancets USE  TID TO CHECK BLOOD SUGAR LEVELS  0  . amLODipine (NORVASC) 5 MG tablet TAKE 1 TABLET BY MOUTH DAILY 90 tablet 0  . aspirin EC 81 MG tablet Take 81 mg by mouth daily.    Marland Kitchen atorvastatin (LIPITOR) 80 MG tablet  Take 1 tablet (80 mg total) by mouth every evening. 90 tablet 3  . busPIRone (BUSPAR) 10 MG tablet Take 10 mg by mouth 3 (three) times daily.  5  . capecitabine (XELODA) 500 MG tablet Take four tablets (2000 mg) every morning and four tablets (2000 mg) every evening with food. Take days 1-7 and days 15-21, every 28 days. 112 tablet 0  . docusate sodium (COLACE) 100 MG capsule Take 100 mg by mouth 2 (two) times daily.    Marland Kitchen levothyroxine (SYNTHROID, LEVOTHROID) 150 MCG tablet TAKE 1 TABLET(150 MCG) BY MOUTH DAILY 30 tablet 0  . lidocaine-prilocaine (EMLA) cream Apply 1 application topically as needed. Apply to portacath site 1 hour prior to use 30 g 3  . meclizine (ANTIVERT) 25 MG tablet Take 25 mg by mouth 3 (three) times daily as needed.  1  . metFORMIN (GLUCOPHAGE-XR) 500 MG 24 hr tablet Take 1,000 mg by mouth daily. With evening meal  3  . metoprolol (LOPRESSOR) 50 MG tablet Take 0.5 tablets (25 mg total) by mouth 2 (two) times daily. 90 tablet 3  . nitroGLYCERIN (NITROSTAT) 0.4 MG SL tablet PLACE 1 TABLET UNDER THE TONGUE EVERY 5 MINUTES AS NEEDED FOR CHEST PAIN. 25 tablet 0  . omega-3 acid ethyl esters (LOVAZA) 1 g capsule Take 1 capsule (1 g total) by mouth 2 (two) times daily. 60 capsule 5  .  oxyCODONE-acetaminophen (PERCOCET) 10-325 MG tablet Take 1 tablet by mouth every 6 (six) hours as needed for pain (for back pain). 30 tablet 0  . pantoprazole (PROTONIX) 40 MG tablet Take 40 mg by mouth daily.   5  . prochlorperazine (COMPAZINE) 10 MG tablet Take 1 tablet (10 mg total) by mouth every 6 (six) hours as needed for nausea or vomiting. 30 tablet 3  . promethazine (PHENERGAN) 12.5 MG tablet Take 1 tablet (12.5 mg total) by mouth every 6 (six) hours as needed. 30 tablet 3  . sildenafil (REVATIO) 20 MG tablet Take 20-100 mg by mouth daily as needed for erectile dysfunction.  11  . tamsulosin (FLOMAX) 0.4 MG CAPS capsule Take 0.4 mg by mouth every evening.     . temazepam (RESTORIL) 30 MG capsule Take 30 mg by mouth at bedtime.  0  . tiZANidine (ZANAFLEX) 4 MG tablet Take 4 mg by mouth 2 (two) times daily as needed for muscle spasms.   2  . Venlafaxine HCl 225 MG TB24 Take 225 mg by mouth daily.  6  . capecitabine (XELODA) 500 MG tablet TAKE FOUR TABLETS (2000 MG) EVERY MORNING AND FOUR TABLETS (2000 MG) EVERY EVENING WITH FOOD. TAKE DAYS 1-7 AND DAYS 15-21, EVERY 28 DAYS. (Patient not taking: No sig reported) 112 tablet 0   Current Facility-Administered Medications on File Prior to Visit  Medication Dose Route Frequency Provider Last Rate Last Dose  . alteplase (CATHFLO ACTIVASE) injection 2 mg  2 mg Intracatheter Once PRN Betsy Coder B, MD      . sodium chloride flush (NS) 0.9 % injection 10 mL  10 mL Intravenous PRN Ladell Pier, MD   10 mL at 02/04/16 1431  . sodium chloride flush (NS) 0.9 % injection 10 mL  10 mL Intravenous PRN Ladell Pier, MD   10 mL at 10/18/16 4742    Allergies  Allergen Reactions  . Hydrocodone Hives    Family History  Problem Relation Age of Onset  . Thyroid disease Mother        hypothyroidism  .  Heart attack Maternal Grandfather   . Heart Problems Father        pacermaker  . Edema Father   . Heart disease Maternal Grandmother   . Lung  cancer Maternal Grandmother   . Diabetes Maternal Grandmother   . Hypertension Maternal Grandmother     BP 130/86   Pulse 81   Ht 5\' 10"  (1.778 m)   Wt 263 lb (119.3 kg)   SpO2 97%   BMI 37.74 kg/m    Review of Systems He has insomnia and anxiety.      Objective:   Physical Exam VITAL SIGNS:  See vs page.  GENERAL: no distress.  Right TM is slightly red.   Lab Results  Component Value Date   HGBA1C 6.5 02/07/2017      Assessment & Plan:  AOM, new. Insulin-requiring type 2 DM, with PAD: overcontrolled, given this regimen, which does match insulin to her changing needs throughout the day.  Patient Instructions  I have sent a prescription to your pharmacy, for an antibiotic pill. Loratadine-d (non-prescription) will help your congestion. blood tests are requested for you today.  We'll let you know about the results. Please decrease the insulin to 45 units twice a day (breakfast and supper) check your blood sugar twice a day.  vary the time of day when you check, between before the 3 meals, and at bedtime.  also check if you have symptoms of your blood sugar being too high or too low.  please keep a record of the readings and bring it to your next appointment here (or you can bring the meter itself).  You can write it on any piece of paper.  please call us sooner if your blood sugar goes below 70, or if you have a lot of readings over 200.  Please come back for a follow-up appointment in 4 months.

## 2017-02-17 ENCOUNTER — Other Ambulatory Visit: Payer: Self-pay | Admitting: Pharmacist

## 2017-02-18 ENCOUNTER — Other Ambulatory Visit: Payer: Self-pay | Admitting: Endocrinology

## 2017-02-23 ENCOUNTER — Other Ambulatory Visit (HOSPITAL_BASED_OUTPATIENT_CLINIC_OR_DEPARTMENT_OTHER): Payer: Medicaid Other

## 2017-02-23 ENCOUNTER — Ambulatory Visit (HOSPITAL_BASED_OUTPATIENT_CLINIC_OR_DEPARTMENT_OTHER): Payer: Medicaid Other | Admitting: Oncology

## 2017-02-23 ENCOUNTER — Telehealth: Payer: Self-pay | Admitting: Nurse Practitioner

## 2017-02-23 VITALS — BP 105/69 | HR 96 | Temp 97.7°F | Resp 18 | Ht 70.0 in | Wt 260.9 lb

## 2017-02-23 DIAGNOSIS — C787 Secondary malignant neoplasm of liver and intrahepatic bile duct: Secondary | ICD-10-CM

## 2017-02-23 DIAGNOSIS — I10 Essential (primary) hypertension: Secondary | ICD-10-CM | POA: Diagnosis not present

## 2017-02-23 DIAGNOSIS — C189 Malignant neoplasm of colon, unspecified: Secondary | ICD-10-CM

## 2017-02-23 DIAGNOSIS — E119 Type 2 diabetes mellitus without complications: Secondary | ICD-10-CM | POA: Diagnosis not present

## 2017-02-23 DIAGNOSIS — C187 Malignant neoplasm of sigmoid colon: Secondary | ICD-10-CM

## 2017-02-23 LAB — COMPREHENSIVE METABOLIC PANEL
ALT: 43 U/L (ref 0–55)
AST: 37 U/L — ABNORMAL HIGH (ref 5–34)
Albumin: 3.8 g/dL (ref 3.5–5.0)
Alkaline Phosphatase: 105 U/L (ref 40–150)
Anion Gap: 13 mEq/L — ABNORMAL HIGH (ref 3–11)
BUN: 11.7 mg/dL (ref 7.0–26.0)
CO2: 20 mEq/L — ABNORMAL LOW (ref 22–29)
Calcium: 9.4 mg/dL (ref 8.4–10.4)
Chloride: 107 mEq/L (ref 98–109)
Creatinine: 1.4 mg/dL — ABNORMAL HIGH (ref 0.7–1.3)
EGFR: >60 mL/min/{1.73_m2} (ref >60)
Glucose: 164 mg/dl — ABNORMAL HIGH (ref 70–140)
Potassium: 3.9 mEq/L (ref 3.5–5.1)
Sodium: 140 mEq/L (ref 136–145)
Total Bilirubin: 0.46 mg/dL (ref 0.20–1.20)
Total Protein: 7.6 g/dL (ref 6.4–8.3)

## 2017-02-23 LAB — CBC WITH DIFFERENTIAL/PLATELET
BASO%: 0.1 % (ref 0.0–2.0)
Basophils Absolute: 0 10*3/uL (ref 0.0–0.1)
EOS%: 1.9 % (ref 0.0–7.0)
Eosinophils Absolute: 0.2 10*3/uL (ref 0.0–0.5)
HCT: 42.7 % (ref 38.4–49.9)
HGB: 13.9 g/dL (ref 13.0–17.1)
LYMPH%: 39.3 % (ref 14.0–49.0)
MCH: 30 pg (ref 27.2–33.4)
MCHC: 32.6 g/dL (ref 32.0–36.0)
MCV: 92 fL (ref 79.3–98.0)
MONO#: 0.6 10*3/uL (ref 0.1–0.9)
MONO%: 6.5 % (ref 0.0–14.0)
NEUT#: 4.4 10*3/uL (ref 1.5–6.5)
NEUT%: 52.2 % (ref 39.0–75.0)
Platelets: 147 10*3/uL (ref 140–400)
RBC: 4.64 10*6/uL (ref 4.20–5.82)
RDW: 19 % — ABNORMAL HIGH (ref 11.0–14.6)
WBC: 8.4 10*3/uL (ref 4.0–10.3)
lymph#: 3.3 10*3/uL (ref 0.9–3.3)

## 2017-02-23 LAB — CEA (IN HOUSE-CHCC): CEA (CHCC-In House): 1.58 ng/mL (ref 0.00–5.00)

## 2017-02-23 MED ORDER — CAPECITABINE 500 MG PO TABS: 2000.0000 mg | ORAL_TABLET | Freq: Two times a day (BID) | ORAL | 0 refills | Status: DC

## 2017-02-23 MED ORDER — PROMETHAZINE HCL 12.5 MG PO TABS: 12.5000 mg | ORAL_TABLET | Freq: Four times a day (QID) | ORAL | 3 refills | Status: DC | PRN

## 2017-02-23 NOTE — Progress Notes (Signed)
[place] OFFICE PROGRESS NOTE   Diagnosis: Colon cancer  INTERVAL HISTORY:   Jeff [name] continue Xeloda. He started another week of Xeloda yesterday. No mouth sores, diarrhea, or hand/foot pain. Neuropathy symptoms have improved in the hands. He had a tooth extracted earlier this week.  Objective:   Vital signs in last 24 hours:  Blood pressure 105/69, pulse 96, temperature 97.7 F (36.5 C), temperature source Oral, resp. rate 18, height '5\' 10"'$  (1.778 m), weight 260 lb 14.4 oz (118.3 kg), SpO2 100 %.    HEENT: No thrush or ulcers Resp: Lungs clear bilaterally Cardio: Regular rate and rhythm GI: No hepatosplenomegaly, nontender Vascular: No leg edema  Skin: Mild hyperpigmentation of the hands, faint erythema at the soles. No skin breakdown.   Portacath/PICC-without erythema  Lab Results:  Lab Results  Component Value Date   WBC 8.4 02/23/2017   HGB 13.9 02/23/2017   HCT 42.7 02/23/2017   MCV 92.0 02/23/2017   PLT 147 02/23/2017   NEUTROABS 4.4 02/23/2017    CMP     Component Value Date/Time   NA 140 02/23/2017 0811   K 3.9 02/23/2017 0811   CL 98 (L) 06/27/2016 1331   CO2 20 (L) 02/23/2017 0811   GLUCOSE 164 (H) 02/23/2017 0811   BUN 11.7 02/23/2017 0811   CREATININE 1.4 (H) 02/23/2017 0811   CALCIUM 9.4 02/23/2017 0811   PROT 7.6 02/23/2017 0811   ALBUMIN 3.8 02/23/2017 0811   AST 37 (H) 02/23/2017 0811   ALT 43 02/23/2017 0811   ALKPHOS 105 02/23/2017 0811   BILITOT 0.46 02/23/2017 0811   GFRNONAA 59 (L) 06/27/2016 1331   GFRAA >60 06/27/2016 1331    Lab Results  Component Value Date   CEA1 <1.00 01/05/2017     Medications: I have reviewed the patient's current medications.  Assessment/Plan: 1. 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 CTschest, abdomen, and pelvis on 12/11/2015-indeterminate tiny pulmonary nodules, multiple liver  metastases, small nodes in the sigmoid mesocolon  3. Laparoscopic sigmoid colectomy, liver biopsy, Port-A-Cath placement 01/14/2016  4. Pathology sigmoid colon resection-colonic adenocarcinoma, 5.3 cm extending into pericolonic connective tissue, positive lymph-vascular invasion, positive perineural invasion, negative margins, metastatic carcinoma in9 of 28 lymph nodes  5. Pathology liver biopsy-metastatic colorectal adenocarcinoma  6. MSI stable; mismatch repair protein normal  7. 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 Cycle 1 FOLFOX 02/02/2016  9. Cycle 2 FOLFOX 02/15/2016  10. Cycle 3 FOLFOX 02/29/2016  11. Cycle 4 FOLFOX 03/14/2016  12. Cycle 5 FOLFOX 03/28/2016  13. Cycle 6 FOLFOX 04/11/2016 (oxaliplatin held secondary to thrombocytopenia)  14. 04/21/2016 restaging CTs-difficulty evaluating liver lesions due to hepatic steatosis. Stable right adrenal nodule. No adenopathy or local recurrence near the rectosigmoid anastomotic site.  15. Cycle 7 FOLFOX 04/25/2016  16. MRI liver 05/02/2016-partial improvement in hepatic metastases  17. Cycle 8 FOLFOX 05/10/2016  18. Cycle 9 FOLFOX 05/23/2016 (oxaliplatin held due to thrombocytopenia)  19. Cycle 10 FOLFOX 06/06/2016 (oxaliplatin dose reduced due to thrombocytopenia)  20. Cycle 11 FOLFOX 06/27/2016 (oxaliplatin held due to neuropathy)  21. Cycle 12 FOLFOX 07/11/2016 (oxaliplatin held) 22. Initiation of maintenance Xeloda 7 days on/7 days off 07/27/2016 23. MRI liver 11/18/2016-decrease in hepatic metastatic disease. No new or progressive disease identified within the abdomen. 24. Continuation of Xeloda 7 days on/7 days off  2. Rectal bleeding and constipation secondary to #1  3. History of peripheral vascular  disease, status post left lower extremity vascular bypass surgery in April 2017  4. History of nephrolithiasis  5. History of Graves' disease treated with radioactive iodine  6.  Anxiety/depression  7. Hypertension  8. Hospitalization 01/19/2016 with wound dehiscence status post secondary suture closure of abdominal wall  9. 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 Hyperglycemia 06/20/2016-diagnosed with diabetes, maintained on insulin     Disposition:  Jeff [name] continues maintenance Xeloda. We will follow-up on the CEA from today. There is no clinical evidence of disease progression. He will return for an office visit and Port-A-Cath flush in one month.  15 minutes were spent with the patient today. The majority of the time was used for counseling and coordination of care.  Donneta Romberg, MD  02/23/2017  9:13 AM

## 2017-02-23 NOTE — Telephone Encounter (Signed)
Gave avs and calendar for November  °

## 2017-03-07 MED FILL — CAPECITABINE 500 MG TABLET: 500 | 28 days supply | Qty: 112 | Fill #0

## 2017-03-08 ENCOUNTER — Ambulatory Visit: Payer: Medicaid Other | Admitting: Endocrinology

## 2017-03-22 ENCOUNTER — Telehealth: Payer: Self-pay | Admitting: Emergency Medicine

## 2017-03-22 ENCOUNTER — Telehealth: Payer: Self-pay

## 2017-03-22 ENCOUNTER — Ambulatory Visit (HOSPITAL_BASED_OUTPATIENT_CLINIC_OR_DEPARTMENT_OTHER): Payer: Medicaid Other | Admitting: Nurse Practitioner

## 2017-03-22 ENCOUNTER — Ambulatory Visit: Payer: Medicaid Other

## 2017-03-22 ENCOUNTER — Other Ambulatory Visit: Payer: Self-pay | Admitting: Emergency Medicine

## 2017-03-22 ENCOUNTER — Encounter: Payer: Self-pay | Admitting: Nurse Practitioner

## 2017-03-22 ENCOUNTER — Other Ambulatory Visit (HOSPITAL_BASED_OUTPATIENT_CLINIC_OR_DEPARTMENT_OTHER): Payer: Medicaid Other

## 2017-03-22 VITALS — BP 150/90 | HR 98 | Temp 98.1°F | Resp 18 | Ht 70.0 in | Wt 261.5 lb

## 2017-03-22 DIAGNOSIS — I10 Essential (primary) hypertension: Secondary | ICD-10-CM

## 2017-03-22 DIAGNOSIS — C787 Secondary malignant neoplasm of liver and intrahepatic bile duct: Secondary | ICD-10-CM

## 2017-03-22 DIAGNOSIS — C189 Malignant neoplasm of colon, unspecified: Secondary | ICD-10-CM

## 2017-03-22 DIAGNOSIS — F419 Anxiety disorder, unspecified: Secondary | ICD-10-CM

## 2017-03-22 DIAGNOSIS — C187 Malignant neoplasm of sigmoid colon: Secondary | ICD-10-CM

## 2017-03-22 DIAGNOSIS — Z95828 Presence of other vascular implants and grafts: Secondary | ICD-10-CM

## 2017-03-22 LAB — CBC WITH DIFFERENTIAL/PLATELET
BASO%: 0 % (ref 0.0–2.0)
Basophils Absolute: 0 10*3/uL (ref 0.0–0.1)
EOS%: 1.6 % (ref 0.0–7.0)
Eosinophils Absolute: 0.1 10*3/uL (ref 0.0–0.5)
HCT: 40.8 % (ref 38.4–49.9)
HGB: 13.2 g/dL (ref 13.0–17.1)
LYMPH%: 34.4 % (ref 14.0–49.0)
MCH: 30.1 pg (ref 27.2–33.4)
MCHC: 32.4 g/dL (ref 32.0–36.0)
MCV: 92.9 fL (ref 79.3–98.0)
MONO#: 0.6 10*3/uL (ref 0.1–0.9)
MONO%: 7.9 % (ref 0.0–14.0)
NEUT#: 4.1 10*3/uL (ref 1.5–6.5)
NEUT%: 56.1 % (ref 39.0–75.0)
Platelets: 130 10*3/uL — ABNORMAL LOW (ref 140–400)
RBC: 4.39 10*6/uL (ref 4.20–5.82)
RDW: 18.5 % — ABNORMAL HIGH (ref 11.0–14.6)
WBC: 7.4 10*3/uL (ref 4.0–10.3)
lymph#: 2.5 10*3/uL (ref 0.9–3.3)

## 2017-03-22 LAB — COMPREHENSIVE METABOLIC PANEL
ALT: 57 U/L — ABNORMAL HIGH (ref 0–55)
AST: 50 U/L — ABNORMAL HIGH (ref 5–34)
Albumin: 3.9 g/dL (ref 3.5–5.0)
Alkaline Phosphatase: 77 U/L (ref 40–150)
Anion Gap: 10 mEq/L (ref 3–11)
BUN: 10.5 mg/dL (ref 7.0–26.0)
CO2: 25 mEq/L (ref 22–29)
Calcium: 8.9 mg/dL (ref 8.4–10.4)
Chloride: 104 mEq/L (ref 98–109)
Creatinine: 1 mg/dL (ref 0.7–1.3)
EGFR: >60 mL/min/{1.73_m2} (ref >60)
Glucose: 139 mg/dl (ref 70–140)
Potassium: 3.6 mEq/L (ref 3.5–5.1)
Sodium: 139 mEq/L (ref 136–145)
Total Bilirubin: 0.72 mg/dL (ref 0.20–1.20)
Total Protein: 7.6 g/dL (ref 6.4–8.3)

## 2017-03-22 LAB — CEA (IN HOUSE-CHCC): CEA (CHCC-In House): 1.47 ng/mL (ref 0.00–5.00)

## 2017-03-22 MED ORDER — CAPECITABINE 500 MG PO TABS: 2000.0000 mg | ORAL_TABLET | Freq: Two times a day (BID) | ORAL | 0 refills | Status: DC

## 2017-03-22 MED ORDER — SODIUM CHLORIDE 0.9% FLUSH
10.0000 mL | INTRAVENOUS | Status: DC | PRN
  Administered 2017-03-22: 10 mL via INTRAVENOUS
  Filled 2017-03-22: qty 10

## 2017-03-22 MED ORDER — DIAZEPAM 5 MG PO TABS: ORAL_TABLET | ORAL | 0 refills | Status: DC

## 2017-03-22 MED ORDER — HEPARIN SOD (PORK) LOCK FLUSH 100 UNIT/ML IV SOLN
500.0000 [IU] | Freq: Once | INTRAVENOUS | Status: AC | PRN
  Administered 2017-03-22: 500 [IU] via INTRAVENOUS
  Filled 2017-03-22: qty 5

## 2017-03-22 NOTE — Telephone Encounter (Signed)
Called pts PCP per Lattie Haw NP to request they reach out to patient regarding his anxiety. Nurse at PCP office said "I will reach out to him today".

## 2017-03-22 NOTE — Progress Notes (Signed)
[name] OFFICE PROGRESS NOTE   Diagnosis: Colon cancer  INTERVAL HISTORY:   Jeff [name] returns as scheduled.  He continues Xeloda.  He denies nausea/vomiting.  No mouth sores.  No diarrhea.  No hand or foot pain or redness.  For the past week he has had increased anxiety.  This has been managed in the past by his PCP.  He is currently taking BuSpar.  Objective:  Vital signs in last 24 hours:  Blood pressure (!) 165/104, pulse 98, temperature 98.1 F (36.7 C), temperature source Oral, resp. rate 18, height '5\' 10"'$  (1.778 m), weight 261 lb 8 oz (118.6 kg), SpO2 100 %.    HEENT: No thrush or ulcers. Resp: Lungs clear bilaterally. Cardio: Regular rate and rhythm. GI: Abdomen soft and nontender.  No hepatomegaly. Vascular: No leg edema. Skin: Palms without erythema. Port-A-Cath without erythema.   Lab Results:  Lab Results  Component Value Date   WBC 7.4 03/22/2017   HGB 13.2 03/22/2017   HCT 40.8 03/22/2017   MCV 92.9 03/22/2017   PLT 130 (L) 03/22/2017   NEUTROABS 4.1 03/22/2017    Imaging:  No results found.  Medications: I have reviewed the patient's current medications.  Assessment/Plan: 1. 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 CTschest, abdomen, and pelvis on 12/11/2015-indeterminate tiny pulmonary nodules, multiple liver metastases, small nodes in the sigmoid mesocolon  3. Laparoscopic sigmoid colectomy, liver biopsy, Port-A-Cath placement 01/14/2016  4. Pathology sigmoid colon resection-colonic adenocarcinoma, 5.3 cm extending into pericolonic connective tissue, positive lymph-vascular invasion, positive perineural invasion, negative margins, metastatic carcinoma in9 of 28 lymph nodes  5. Pathology liver biopsy-metastatic colorectal adenocarcinoma  6. MSI stable; mismatch repair protein normal  7. 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 Cycle 1 FOLFOX 02/02/2016  9. Cycle 2 FOLFOX 02/15/2016  10. Cycle 3 FOLFOX 02/29/2016  11. Cycle 4 FOLFOX 03/14/2016  12. Cycle 5 FOLFOX 03/28/2016  13. Cycle 6 FOLFOX 04/11/2016 (oxaliplatin held secondary to thrombocytopenia)  14. 04/21/2016 restaging CTs-difficulty evaluating liver lesions due to hepatic steatosis. Stable right adrenal nodule. No adenopathy or local recurrence near the rectosigmoid anastomotic site.  15. Cycle 7 FOLFOX 04/25/2016  16. MRI liver 05/02/2016-partial improvement in hepatic metastases  17. Cycle 8 FOLFOX 05/10/2016  18. Cycle 9 FOLFOX 05/23/2016 (oxaliplatin held due to thrombocytopenia)  19. Cycle 10 FOLFOX 06/06/2016 (oxaliplatin dose reduced due to thrombocytopenia)  20. Cycle 11 FOLFOX 06/27/2016 (oxaliplatin held due to neuropathy)  21. Cycle 12 FOLFOX 07/11/2016 (oxaliplatin held) 22. Initiation of maintenance Xeloda 7 days on/7 days off 07/27/2016 23. MRI liver 11/18/2016-decrease in hepatic metastatic disease. No new or progressive disease identified within the abdomen. 24. Continuation of Xeloda 7 days on/7 days off  2. Rectal bleeding and constipation secondary to #1  3. History of peripheral vascular disease, status post left lower extremity vascular bypass surgery in April 2017  4. History of nephrolithiasis  5. History of Graves' disease treated with radioactive iodine  6. Anxiety/depression  7. Hypertension  8. Hospitalization 01/19/2016 with wound dehiscence status post secondary suture closure of abdominal wall  9. 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 Hyperglycemia 06/20/2016-diagnosed with diabetes, maintained on insulin   Disposition: Jeff [name] appears stable.  There is no clinical evidence of disease progression.  He will continue Xeloda as he is currently taking.  The plan is for a restaging MRI of the  liver prior to his next  visit.  He is having increased anxiety.  This is managed by his PCP.  We will contact their office with his concerns.  He will return for a follow-up visit in 6 weeks with restaging MRI of the liver the week prior.  Plan reviewed with Dr. Benay Spice.    Ned Card ANP/GNP-BC   03/22/2017  9:57 AM

## 2017-03-22 NOTE — Telephone Encounter (Signed)
Printed avs and cslender for upcoming appointment. Per 11/7 los

## 2017-03-28 ENCOUNTER — Other Ambulatory Visit: Payer: Self-pay | Admitting: Endocrinology

## 2017-04-03 MED FILL — CAPECITABINE 500 MG TABLET: 500 | 28 days supply | Qty: 112 | Fill #0

## 2017-04-14 ENCOUNTER — Other Ambulatory Visit: Payer: Self-pay | Admitting: Cardiovascular Disease

## 2017-04-14 DIAGNOSIS — I10 Essential (primary) hypertension: Secondary | ICD-10-CM

## 2017-04-14 NOTE — Telephone Encounter (Signed)
Refill Request.  

## 2017-04-25 ENCOUNTER — Other Ambulatory Visit: Payer: Self-pay | Admitting: Oncology

## 2017-04-25 DIAGNOSIS — C187 Malignant neoplasm of sigmoid colon: Secondary | ICD-10-CM

## 2017-04-27 ENCOUNTER — Ambulatory Visit
Admission: RE | Admit: 2017-04-27 | Discharge: 2017-04-27 | Disposition: A | Discharge status: Home / Self Care | Payer: Medicaid Other | Source: Ambulatory Visit | Attending: Nurse Practitioner | Admitting: Nurse Practitioner

## 2017-04-27 DIAGNOSIS — C787 Secondary malignant neoplasm of liver and intrahepatic bile duct: Secondary | ICD-10-CM

## 2017-04-27 DIAGNOSIS — C189 Malignant neoplasm of colon, unspecified: Secondary | ICD-10-CM

## 2017-04-27 MED ORDER — GADOBENATE DIMEGLUMINE 529 MG/ML IV SOLN
20.0000 mL | Freq: Once | INTRAVENOUS | Status: AC | PRN
  Administered 2017-04-27: 20 mL via INTRAVENOUS

## 2017-04-28 ENCOUNTER — Telehealth: Payer: Self-pay | Admitting: Emergency Medicine

## 2017-04-28 NOTE — Telephone Encounter (Addendum)
Left VM for patient to call back regarding this note.   ----- Message from Ladell Pier, MD sent at 04/28/2017  2:43 PM EST ----- Please call patient, MRI shows no evidence of cancer, follow-up as scheduled

## 2017-05-01 MED FILL — CAPECITABINE 500 MG TABLET: 500 | 28 days supply | Qty: 112 | Fill #0

## 2017-05-03 ENCOUNTER — Ambulatory Visit: Payer: Medicaid Other

## 2017-05-03 ENCOUNTER — Other Ambulatory Visit: Payer: Self-pay | Admitting: Internal Medicine

## 2017-05-03 ENCOUNTER — Ambulatory Visit (HOSPITAL_BASED_OUTPATIENT_CLINIC_OR_DEPARTMENT_OTHER): Payer: Medicaid Other | Admitting: Oncology

## 2017-05-03 ENCOUNTER — Other Ambulatory Visit (HOSPITAL_BASED_OUTPATIENT_CLINIC_OR_DEPARTMENT_OTHER): Payer: Medicaid Other

## 2017-05-03 ENCOUNTER — Other Ambulatory Visit: Payer: Self-pay

## 2017-05-03 ENCOUNTER — Telehealth: Payer: Self-pay | Admitting: Oncology

## 2017-05-03 ENCOUNTER — Encounter: Payer: Self-pay | Admitting: Oncology

## 2017-05-03 VITALS — BP 112/71 | HR 89 | Temp 97.6°F | Resp 18 | Ht 70.0 in | Wt 265.9 lb

## 2017-05-03 DIAGNOSIS — E119 Type 2 diabetes mellitus without complications: Secondary | ICD-10-CM

## 2017-05-03 DIAGNOSIS — C187 Malignant neoplasm of sigmoid colon: Secondary | ICD-10-CM

## 2017-05-03 DIAGNOSIS — C189 Malignant neoplasm of colon, unspecified: Secondary | ICD-10-CM

## 2017-05-03 DIAGNOSIS — C787 Secondary malignant neoplasm of liver and intrahepatic bile duct: Secondary | ICD-10-CM

## 2017-05-03 DIAGNOSIS — I10 Essential (primary) hypertension: Secondary | ICD-10-CM | POA: Diagnosis not present

## 2017-05-03 DIAGNOSIS — Z95828 Presence of other vascular implants and grafts: Secondary | ICD-10-CM

## 2017-05-03 LAB — CBC WITH DIFFERENTIAL/PLATELET
BASO%: 0.2 % (ref 0.0–2.0)
Basophils Absolute: 0 10*3/uL (ref 0.0–0.1)
EOS%: 2.7 % (ref 0.0–7.0)
Eosinophils Absolute: 0.2 10*3/uL (ref 0.0–0.5)
HCT: 42.6 % (ref 38.4–49.9)
HGB: 13.8 g/dL (ref 13.0–17.1)
LYMPH%: 30.7 % (ref 14.0–49.0)
MCH: 30.3 pg (ref 27.2–33.4)
MCHC: 32.4 g/dL (ref 32.0–36.0)
MCV: 93.4 fL (ref 79.3–98.0)
MONO#: 0.5 10*3/uL (ref 0.1–0.9)
MONO%: 7.4 % (ref 0.0–14.0)
NEUT#: 4.3 10*3/uL (ref 1.5–6.5)
NEUT%: 59 % (ref 39.0–75.0)
Platelets: 143 10*3/uL (ref 140–400)
RBC: 4.56 10*6/uL (ref 4.20–5.82)
RDW: 20.1 % — ABNORMAL HIGH (ref 11.0–14.6)
WBC: 7.4 10*3/uL (ref 4.0–10.3)
lymph#: 2.3 10*3/uL (ref 0.9–3.3)

## 2017-05-03 LAB — COMPREHENSIVE METABOLIC PANEL
ALT: 52 U/L (ref 0–55)
AST: 36 U/L — ABNORMAL HIGH (ref 5–34)
Albumin: 3.8 g/dL (ref 3.5–5.0)
Alkaline Phosphatase: 85 U/L (ref 40–150)
Anion Gap: 9 mEq/L (ref 3–11)
BUN: 10.4 mg/dL (ref 7.0–26.0)
CO2: 25 mEq/L (ref 22–29)
Calcium: 8.6 mg/dL (ref 8.4–10.4)
Chloride: 103 mEq/L (ref 98–109)
Creatinine: 1.3 mg/dL (ref 0.7–1.3)
EGFR: >60 mL/min/{1.73_m2} (ref >60)
Glucose: 185 mg/dl — ABNORMAL HIGH (ref 70–140)
Potassium: 3.7 mEq/L (ref 3.5–5.1)
Sodium: 138 mEq/L (ref 136–145)
Total Bilirubin: 0.73 mg/dL (ref 0.20–1.20)
Total Protein: 7.5 g/dL (ref 6.4–8.3)

## 2017-05-03 LAB — CEA (IN HOUSE-CHCC): CEA (CHCC-In House): 1.41 ng/mL (ref 0.00–5.00)

## 2017-05-03 MED ORDER — SODIUM CHLORIDE 0.9% FLUSH
10.0000 mL | INTRAVENOUS | Status: AC | PRN
  Administered 2017-05-03: 10 mL via INTRAVENOUS
  Filled 2017-05-03: qty 10

## 2017-05-03 MED ORDER — HEPARIN SOD (PORK) LOCK FLUSH 100 UNIT/ML IV SOLN
500.0000 [IU] | Freq: Once | INTRAVENOUS | Status: AC | PRN
  Administered 2017-05-03: 500 [IU] via INTRAVENOUS
  Filled 2017-05-03: qty 5

## 2017-05-03 NOTE — Telephone Encounter (Signed)
Scheduled appt per 12/19 los - Gave patient AVS and calender per los. 12/19 los.

## 2017-05-03 NOTE — Progress Notes (Signed)
[name] OFF[facility] PROGRESS NOTE   Diagnosis: Colon cancer  INTERVAL HISTORY:   Jeff [name] [name] returns as scheduled.  He continues Xeloda.  No mouth sores or diarrhea.  Neuropathy symptoms have improved.  Good appetite.  No new complaint.  Objective:  Vital signs in last 24 hours:  Blood pressure 112/71, pulse 89, temperature 97.6 F (36.4 C), temperature source Oral, resp. rate 18, height '5\' 10"'$  (1.778 m), weight 265 lb 14.4 oz (120.6 kg), SpO2 100 %.    HEENT: No thrush or ulcers Lymphatics: No cervical or supraclavicular nodes Resp: Lungs clear bilaterally Cardio: Regular rate and rhythm GI: No hepatomegaly, nontender Vascular: No leg edema  Skin: Hyperpigmentation of the hands and feet with skin thickening.  Dry desquamation at the heels.  No erythema.  Portacath/PICC-without erythema  Lab Results:  Lab Results  Component Value Date   WBC 7.4 05/03/2017   HGB 13.8 05/03/2017   HCT 42.6 05/03/2017   MCV 93.4 05/03/2017   PLT 143 05/03/2017   NEUTROABS 4.3 05/03/2017    CMP     Component Value Date/Time   NA 138 05/03/2017 0849   K 3.7 05/03/2017 0849   CL 98 (L) 06/27/2016 1331   CO2 25 05/03/2017 0849   GLUCOSE 185 (H) 05/03/2017 0849   BUN 10.4 05/03/2017 0849   CREATININE 1.3 05/03/2017 0849   CALCIUM 8.6 05/03/2017 0849   PROT 7.5 05/03/2017 0849   ALBUMIN 3.8 05/03/2017 0849   AST 36 (H) 05/03/2017 0849   ALT 52 05/03/2017 0849   ALKPHOS 85 05/03/2017 0849   BILITOT 0.73 05/03/2017 0849   GFRNONAA 59 (L) 06/27/2016 1331   GFRAA >60 06/27/2016 1331    Lab Results  Component Value Date   CEA1 1.47 03/22/2017    Lab Results  Component Value Date   INR 0.98 01/19/2016    Imaging:  No results found.  Medications: I have reviewed the patient's current medications.   Assessment/Plan: 1. 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 CTschest, abdomen, and pelvis on 12/11/2015-indeterminate tiny pulmonary nodules, multiple liver metastases, small nodes in the sigmoid mesocolon  3. Laparoscopic sigmoid colectomy, liver biopsy, Port-A-Cath placement 01/14/2016  4. Pathology sigmoid colon resection-colonic adenocarcinoma, 5.3 cm extending into pericolonic connective tissue, positive lymph-vascular invasion, positive perineural invasion, negative margins, metastatic carcinoma in9 of 28 lymph nodes  5. Pathology liver biopsy-metastatic colorectal adenocarcinoma  6. MSI stable; mismatch repair protein normal  7. 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 Cycle 1 FOLFOX 02/02/2016  9. Cycle 2 FOLFOX 02/15/2016  10. Cycle 3 FOLFOX 02/29/2016  11. Cycle 4 FOLFOX 03/14/2016  12. Cycle 5 FOLFOX 03/28/2016  13. Cycle 6 FOLFOX 04/11/2016 (oxaliplatin held secondary to thrombocytopenia)  14. 04/21/2016 restaging CTs-difficulty evaluating liver lesions due to hepatic steatosis. Stable right adrenal nodule. No adenopathy or local recurrence near the rectosigmoid anastomotic site.  15. Cycle 7 FOLFOX 04/25/2016  16. MRI liver 05/02/2016-partial improvement in hepatic metastases  17. Cycle 8 FOLFOX 05/10/2016  18. Cycle 9 FOLFOX 05/23/2016 (oxaliplatin held due to thrombocytopenia)  19. Cycle 10 FOLFOX 06/06/2016 (oxaliplatin dose reduced due to thrombocytopenia)  20. Cycle 11 FOLFOX 06/27/2016 (oxaliplatin held due to neuropathy)  21. Cycle 12 FOLFOX 07/11/2016 (oxaliplatin held) 22. Initiation of maintenance Xeloda 7 days on/7 days off 07/27/2016 23. MRI liver 11/18/2016-decrease in hepatic metastatic disease. No new or progressive disease identified within the abdomen. 24. Continuation of  Xeloda 7 days on/7 days off 25. MRI liver 04/27/2017-previous liver lesions not identified, no new lesions, no lymphadenopathy 26. Of Xeloda continued 7 days on/7 days off  2. Rectal bleeding and constipation secondary to  #1  3. History of peripheral vascular disease, status post left lower extremity vascular bypass surgery in April 2017  4. History of nephrolithiasis  5. History of Graves' disease treated with radioactive iodine  6. Anxiety/depression  7. Hypertension  8. Hospitalization 01/19/2016 with wound dehiscence status post secondary suture closure of abdominal wall  9. 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 Hyperglycemia 06/20/2016-diagnosed with diabetes, maintained on insulin   Disposition: Jeff [name] [name] appears stable.  There is no clinical or MRI evidence of disease progression.  The plan is to continue maintenance Xeloda.  He will return for an office visit and Port-A-Cath flush in 6 weeks.  Betsy Coder, MD  05/03/2017  10:19 AM

## 2017-05-03 NOTE — Patient Instructions (Signed)

## 2017-05-17 ENCOUNTER — Other Ambulatory Visit: Payer: Self-pay | Admitting: Oncology

## 2017-05-17 DIAGNOSIS — C187 Malignant neoplasm of sigmoid colon: Secondary | ICD-10-CM

## 2017-05-22 MED FILL — CAPECITABINE 500 MG TABLET: 500 | 28 days supply | Qty: 112 | Fill #0

## 2017-05-22 MED FILL — NOVOLOG FLEXPEN SYRINGE: 100 | 30 days supply | Qty: 30 | Fill #3

## 2017-06-06 ENCOUNTER — Other Ambulatory Visit: Payer: Self-pay | Admitting: Endocrinology

## 2017-06-09 ENCOUNTER — Ambulatory Visit: Payer: Medicaid Other | Admitting: Endocrinology

## 2017-06-09 DIAGNOSIS — Z0289 Encounter for other administrative examinations: Secondary | ICD-10-CM

## 2017-06-14 ENCOUNTER — Inpatient Hospital Stay: Payer: Medicaid Other

## 2017-06-14 ENCOUNTER — Other Ambulatory Visit: Payer: Self-pay | Admitting: Emergency Medicine

## 2017-06-14 ENCOUNTER — Telehealth: Payer: Self-pay | Admitting: Oncology

## 2017-06-14 ENCOUNTER — Inpatient Hospital Stay: Payer: Medicaid Other | Attending: Nurse Practitioner | Admitting: Nurse Practitioner

## 2017-06-14 ENCOUNTER — Encounter: Payer: Self-pay | Admitting: Nurse Practitioner

## 2017-06-14 VITALS — BP 130/85 | HR 90 | Temp 97.7°F | Resp 18 | Ht 70.0 in | Wt 269.0 lb

## 2017-06-14 DIAGNOSIS — C187 Malignant neoplasm of sigmoid colon: Secondary | ICD-10-CM

## 2017-06-14 DIAGNOSIS — C189 Malignant neoplasm of colon, unspecified: Secondary | ICD-10-CM

## 2017-06-14 DIAGNOSIS — C787 Secondary malignant neoplasm of liver and intrahepatic bile duct: Secondary | ICD-10-CM | POA: Insufficient documentation

## 2017-06-14 DIAGNOSIS — Z95828 Presence of other vascular implants and grafts: Secondary | ICD-10-CM

## 2017-06-14 LAB — COMPREHENSIVE METABOLIC PANEL
ALT: 61 U/L — ABNORMAL HIGH (ref 0–55)
AST: 44 U/L — ABNORMAL HIGH (ref 5–34)
Albumin: 3.7 g/dL (ref 3.5–5.0)
Alkaline Phosphatase: 99 U/L (ref 40–150)
Anion gap: 13 — ABNORMAL HIGH (ref 3–11)
BUN: 9 mg/dL (ref 7–26)
CO2: 22 mmol/L (ref 22–29)
Calcium: 8.8 mg/dL (ref 8.4–10.4)
Chloride: 103 mmol/L (ref 98–109)
Creatinine, Ser: 1.23 mg/dL (ref 0.70–1.30)
GFR calc Af Amer: >60 mL/min (ref >60)
GFR calc non Af Amer: >60 mL/min (ref >60)
Glucose, Bld: 175 mg/dL — ABNORMAL HIGH (ref 70–140)
Potassium: 3.9 mmol/L (ref 3.5–5.1)
Sodium: 138 mmol/L (ref 136–145)
Total Bilirubin: 0.5 mg/dL (ref 0.2–1.2)
Total Protein: 7.3 g/dL (ref 6.4–8.3)

## 2017-06-14 LAB — CBC WITH DIFFERENTIAL/PLATELET
Basophils Absolute: 0 10*3/uL (ref 0.0–0.1)
Basophils Relative: 0 %
Eosinophils Absolute: 0.2 10*3/uL (ref 0.0–0.5)
Eosinophils Relative: 3 %
HCT: 43.5 % (ref 38.4–49.9)
Hemoglobin: 14 g/dL (ref 13.0–17.1)
Lymphocytes Relative: 36 %
Lymphs Abs: 2.9 10*3/uL (ref 0.9–3.3)
MCH: 30.3 pg (ref 27.2–33.4)
MCHC: 32.2 g/dL (ref 32.0–36.0)
MCV: 94.2 fL (ref 79.3–98.0)
Monocytes Absolute: 0.5 10*3/uL (ref 0.1–0.9)
Monocytes Relative: 6 %
Neutro Abs: 4.4 10*3/uL (ref 1.5–6.5)
Neutrophils Relative %: 55 %
Platelets: 154 10*3/uL (ref 140–400)
RBC: 4.62 MIL/uL (ref 4.20–5.82)
RDW: 17.4 % — ABNORMAL HIGH (ref 11.0–15.6)
WBC: 8 10*3/uL (ref 4.0–10.3)

## 2017-06-14 LAB — CEA (IN HOUSE-CHCC): CEA (CHCC-In House): <1 ng/mL (ref 0.00–5.00)

## 2017-06-14 MED ORDER — HEPARIN SOD (PORK) LOCK FLUSH 100 UNIT/ML IV SOLN
500.0000 [IU] | Freq: Once | INTRAVENOUS | Status: AC | PRN
  Administered 2017-06-14: 500 [IU] via INTRAVENOUS
  Filled 2017-06-14: qty 5

## 2017-06-14 MED ORDER — SODIUM CHLORIDE 0.9% FLUSH
10.0000 mL | INTRAVENOUS | Status: DC | PRN
  Administered 2017-06-14: 10 mL via INTRAVENOUS
  Filled 2017-06-14: qty 10

## 2017-06-14 MED ORDER — CAPECITABINE 500 MG PO TABS: ORAL_TABLET | ORAL | 0 refills | Status: DC

## 2017-06-14 NOTE — Progress Notes (Signed)
[place] OFFICE PROGRESS NOTE   Diagnosis: Colon cancer  INTERVAL HISTORY:   Mr. Emig returns as scheduled.  He continues Xeloda.  He denies nausea/vomiting.  No mouth sores.  No diarrhea.  No hand or foot pain or redness.  Very minimal numbness in the fingertips and toes.  He has a good appetite.  No significant abdominal pain.  Objective:  Vital signs in last 24 hours:  Blood pressure 130/85, pulse 90, temperature 97.7 F (36.5 C), temperature source Oral, resp. rate 18, height '5\' 10"'$  (1.778 m), weight 269 lb (122 kg), SpO2 100 %.    HEENT: No thrush or ulcers. Resp: Lungs clear bilaterally. Cardio: Regular rate and rhythm. GI: Abdomen soft and nontender.  No hepatomegaly. Vascular: No leg edema.  Skin: Palms with hyperpigmentation.  No erythema or skin breakdown. Port-A-Cath without erythema.   Lab Results:  Lab Results  Component Value Date   WBC 7.4 05/03/2017   HGB 13.8 05/03/2017   HCT 42.6 05/03/2017   MCV 93.4 05/03/2017   PLT 143 05/03/2017   NEUTROABS 4.3 05/03/2017    Imaging:  No results found.  Medications: I have reviewed the patient's current medications.  Assessment/Plan: 1. 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 CTschest, abdomen, and pelvis on 12/11/2015-indeterminate tiny pulmonary nodules, multiple liver metastases, small nodes in the sigmoid mesocolon  3. Laparoscopic sigmoid colectomy, liver biopsy, Port-A-Cath placement 01/14/2016  4. Pathology sigmoid colon resection-colonic adenocarcinoma, 5.3 cm extending into pericolonic connective tissue, positive lymph-vascular invasion, positive perineural invasion, negative margins, metastatic carcinoma in9 of 28 lymph nodes  5. Pathology liver biopsy-metastatic colorectal adenocarcinoma  6. MSI stable; mismatch repair protein normal  7. 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 Cycle 1 FOLFOX 02/02/2016  9. Cycle 2 FOLFOX 02/15/2016  10. Cycle 3 FOLFOX 02/29/2016  11. Cycle 4 FOLFOX 03/14/2016  12. Cycle 5 FOLFOX 03/28/2016  13. Cycle 6 FOLFOX 04/11/2016 (oxaliplatin held secondary to thrombocytopenia)  14. 04/21/2016 restaging CTs-difficulty evaluating liver lesions due to hepatic steatosis. Stable right adrenal nodule. No adenopathy or local recurrence near the rectosigmoid anastomotic site.  15. Cycle 7 FOLFOX 04/25/2016  16. MRI liver 05/02/2016-partial improvement in hepatic metastases  17. Cycle 8 FOLFOX 05/10/2016  18. Cycle 9 FOLFOX 05/23/2016 (oxaliplatin held due to thrombocytopenia)  19. Cycle 10 FOLFOX 06/06/2016 (oxaliplatin dose reduced due to thrombocytopenia)  20. Cycle 11 FOLFOX 06/27/2016 (oxaliplatin held due to neuropathy)  21. Cycle 12 FOLFOX 07/11/2016 (oxaliplatin held) 22. Initiation of maintenance Xeloda 7 days on/7 days off 07/27/2016 23. MRI liver 11/18/2016-decrease in hepatic metastatic disease. No new or progressive disease identified within the abdomen. 24. Continuation of Xeloda 7 days on/7 days off 25. MRI liver 04/27/2017-previous liver lesions not identified, no new lesions, no lymphadenopathy 26. Xeloda continued 7 days on/7 days off  2. Rectal bleeding and constipation secondary to #1  3. History of peripheral vascular disease, status post left lower extremity vascular bypass surgery in April 2017  4. History of nephrolithiasis  5. History of Graves' disease treated with radioactive iodine  6. Anxiety/depression  7. Hypertension  8. Hospitalization 01/19/2016 with wound dehiscence status post secondary suture closure of abdominal wall  9. 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 Hyperglycemia 06/20/2016-diagnosed with diabetes, maintained on insulin   Disposition: Jeff [name] appears stable.  There is no clinical  evidence of disease progression.  He  will continue Xeloda 7 days on/7 days off.  We will follow-up on the CEA from today.  He will return for a Port-A-Cath flush, labs and follow-up visit in 6 weeks.  He will contact the office in the interim with any problems.    Ned Card ANP/GNP-BC   06/14/2017  8:44 AM

## 2017-06-14 NOTE — Patient Instructions (Signed)

## 2017-06-14 NOTE — Telephone Encounter (Signed)
Gave avs and calendar for march °

## 2017-06-21 MED FILL — UNIFINE PENTIPS 31GX3/16": 31G X 5 MM | 50 days supply | Qty: 100 | Fill #1

## 2017-06-21 MED FILL — UNIFINE PENTIPS 31GX3/16: 31G X 5 MM | 50 days supply | Qty: 100 | Fill #1

## 2017-06-22 MED ORDER — DEXAMETHASONE SODIUM PHOSPHATE 10 MG/ML IJ SOLN
INTRAMUSCULAR | Status: AC
  Filled 2017-06-22: qty 1

## 2017-06-22 MED ORDER — MIDAZOLAM HCL 2 MG/2ML IJ SOLN
INTRAMUSCULAR | Status: AC
  Filled 2017-06-22: qty 2

## 2017-06-22 MED ORDER — FENTANYL CITRATE (PF) 100 MCG/2ML IJ SOLN
INTRAMUSCULAR | Status: AC
  Filled 2017-06-22: qty 2

## 2017-06-22 MED ORDER — PROPOFOL 10 MG/ML IV BOLUS
INTRAVENOUS | Status: AC
  Filled 2017-06-22: qty 20

## 2017-06-22 MED ORDER — LIDOCAINE 2% (20 MG/ML) 5 ML SYRINGE
INTRAMUSCULAR | Status: AC
  Filled 2017-06-22: qty 5

## 2017-06-22 MED ORDER — ONDANSETRON HCL 4 MG/2ML IJ SOLN
INTRAMUSCULAR | Status: AC
  Filled 2017-06-22: qty 2

## 2017-06-23 MED FILL — CAPECITABINE 500 MG TABLET: 500 | 28 days supply | Qty: 112 | Fill #0

## 2017-07-02 ENCOUNTER — Other Ambulatory Visit: Payer: Self-pay | Admitting: Endocrinology

## 2017-07-02 ENCOUNTER — Other Ambulatory Visit: Payer: Self-pay | Admitting: Cardiovascular Disease

## 2017-07-03 NOTE — Telephone Encounter (Signed)
REFILL 

## 2017-07-03 NOTE — Telephone Encounter (Signed)
Refill Request.  

## 2017-07-13 ENCOUNTER — Other Ambulatory Visit: Payer: Self-pay | Admitting: Oncology

## 2017-07-13 DIAGNOSIS — C187 Malignant neoplasm of sigmoid colon: Secondary | ICD-10-CM

## 2017-07-19 ENCOUNTER — Other Ambulatory Visit: Payer: Self-pay | Admitting: Cardiovascular Disease

## 2017-07-19 DIAGNOSIS — I10 Essential (primary) hypertension: Secondary | ICD-10-CM

## 2017-07-20 MED FILL — CAPECITABINE 500 MG TABLET: 500 | 28 days supply | Qty: 112 | Fill #0

## 2017-07-20 NOTE — Telephone Encounter (Signed)
Please review for refill, Thanks !  

## 2017-07-20 NOTE — Telephone Encounter (Signed)
REFILL 

## 2017-07-28 ENCOUNTER — Inpatient Hospital Stay: Payer: Medicaid Other

## 2017-07-28 ENCOUNTER — Inpatient Hospital Stay: Payer: Medicaid Other | Attending: Nurse Practitioner | Admitting: Oncology

## 2017-07-28 ENCOUNTER — Telehealth: Payer: Self-pay

## 2017-07-28 VITALS — BP 121/84 | HR 100 | Temp 97.9°F | Resp 18 | Ht 70.0 in | Wt 268.1 lb

## 2017-07-28 DIAGNOSIS — Z95828 Presence of other vascular implants and grafts: Secondary | ICD-10-CM

## 2017-07-28 DIAGNOSIS — C787 Secondary malignant neoplasm of liver and intrahepatic bile duct: Secondary | ICD-10-CM | POA: Insufficient documentation

## 2017-07-28 DIAGNOSIS — Z794 Long term (current) use of insulin: Secondary | ICD-10-CM | POA: Diagnosis not present

## 2017-07-28 DIAGNOSIS — E119 Type 2 diabetes mellitus without complications: Secondary | ICD-10-CM

## 2017-07-28 DIAGNOSIS — I10 Essential (primary) hypertension: Secondary | ICD-10-CM | POA: Diagnosis not present

## 2017-07-28 DIAGNOSIS — C187 Malignant neoplasm of sigmoid colon: Secondary | ICD-10-CM | POA: Insufficient documentation

## 2017-07-28 DIAGNOSIS — E1165 Type 2 diabetes mellitus with hyperglycemia: Secondary | ICD-10-CM | POA: Diagnosis not present

## 2017-07-28 DIAGNOSIS — R200 Anesthesia of skin: Secondary | ICD-10-CM | POA: Diagnosis not present

## 2017-07-28 DIAGNOSIS — C189 Malignant neoplasm of colon, unspecified: Secondary | ICD-10-CM

## 2017-07-28 LAB — CBC WITH DIFFERENTIAL (CANCER CENTER ONLY)
Basophils Absolute: 0 10*3/uL (ref 0.0–0.1)
Basophils Relative: 0 %
Eosinophils Absolute: 0.3 10*3/uL (ref 0.0–0.5)
Eosinophils Relative: 4 %
HCT: 43.8 % (ref 38.4–49.9)
Hemoglobin: 14.1 g/dL (ref 13.0–17.1)
Lymphocytes Relative: 40 %
Lymphs Abs: 2.9 10*3/uL (ref 0.9–3.3)
MCH: 29.7 pg (ref 27.2–33.4)
MCHC: 32.2 g/dL (ref 32.0–36.0)
MCV: 92.2 fL (ref 79.3–98.0)
Monocytes Absolute: 0.6 10*3/uL (ref 0.1–0.9)
Monocytes Relative: 9 %
Neutro Abs: 3.4 10*3/uL (ref 1.5–6.5)
Neutrophils Relative %: 47 %
Platelet Count: 130 10*3/uL — ABNORMAL LOW (ref 140–400)
RBC: 4.75 MIL/uL (ref 4.20–5.82)
RDW: 17.1 % — ABNORMAL HIGH (ref 11.0–14.6)
WBC Count: 7.1 10*3/uL (ref 4.0–10.3)

## 2017-07-28 LAB — CMP (CANCER CENTER ONLY)
ALT: 68 U/L — ABNORMAL HIGH (ref 0–55)
AST: 50 U/L — ABNORMAL HIGH (ref 5–34)
Albumin: 3.7 g/dL (ref 3.5–5.0)
Alkaline Phosphatase: 115 U/L (ref 40–150)
Anion gap: 11 (ref 3–11)
BUN: 7 mg/dL (ref 7–26)
CO2: 25 mmol/L (ref 22–29)
Calcium: 9.1 mg/dL (ref 8.4–10.4)
Chloride: 104 mmol/L (ref 98–109)
Creatinine: 1.11 mg/dL (ref 0.70–1.30)
GFR, Est AFR Am: >60 mL/min (ref >60)
GFR, Estimated: >60 mL/min (ref >60)
Glucose, Bld: 157 mg/dL — ABNORMAL HIGH (ref 70–140)
Potassium: 3.6 mmol/L (ref 3.5–5.1)
Sodium: 140 mmol/L (ref 136–145)
Total Bilirubin: 0.6 mg/dL (ref 0.2–1.2)
Total Protein: 7.5 g/dL (ref 6.4–8.3)

## 2017-07-28 LAB — CEA (IN HOUSE-CHCC): CEA (CHCC-In House): 1.15 ng/mL (ref 0.00–5.00)

## 2017-07-28 MED ORDER — SODIUM CHLORIDE 0.9% FLUSH
10.0000 mL | INTRAVENOUS | Status: DC | PRN
  Administered 2017-07-28: 10 mL via INTRAVENOUS
  Filled 2017-07-28: qty 10

## 2017-07-28 MED ORDER — HEPARIN SOD (PORK) LOCK FLUSH 100 UNIT/ML IV SOLN
500.0000 [IU] | Freq: Once | INTRAVENOUS | Status: AC | PRN
  Administered 2017-07-28: 500 [IU] via INTRAVENOUS
  Filled 2017-07-28: qty 5

## 2017-07-28 NOTE — Progress Notes (Signed)
[name]   Diagnosis: Colon cancer  INTERVAL HISTORY:   Jeff [name] returns as scheduled.  He continues Xeloda.  He began the current week of Xeloda on 07/26/2017.  No mouth sores or hand/foot pain.  Good appetite.  He recently had a sinus infection and was treated with antibiotics.  He developed diarrhea while on antibiotics.  He continues to have numbness at a few of the toes.  Objective:  Vital signs in last 24 hours:  Blood pressure 121/84, pulse 100, temperature 97.9 F (36.6 C), temperature source Oral, resp. rate 18, height 5' 10" (1.778 m), weight 268 lb 1.6 oz (121.6 kg), SpO2 98 %.    HEENT: No thrush or ulcers Resp: Lungs clear bilaterally Cardio: Regular rate and rhythm GI: No hepatomegaly, nontender, no mass Vascular: No leg edema  Skin: Palms and soles without erythema  Portacath/PICC-without erythema  Lab Results:  Lab Results  Component Value Date   WBC 7.1 07/28/2017   HGB 14.0 06/14/2017   HCT 43.8 07/28/2017   MCV 92.2 07/28/2017   PLT 130 (L) 07/28/2017   NEUTROABS 3.4 07/28/2017    CMP     Component Value Date/Time   NA 140 07/28/2017 0733   NA 138 05/03/2017 0849   K 3.6 07/28/2017 0733   K 3.7 05/03/2017 0849   CL 104 07/28/2017 0733   CO2 25 07/28/2017 0733   CO2 25 05/03/2017 0849   GLUCOSE 157 (H) 07/28/2017 0733   GLUCOSE 185 (H) 05/03/2017 0849   BUN 7 07/28/2017 0733   BUN 10.4 05/03/2017 0849   CREATININE 1.11 07/28/2017 0733   CREATININE 1.3 05/03/2017 0849   CALCIUM 9.1 07/28/2017 0733   CALCIUM 8.6 05/03/2017 0849   PROT 7.5 07/28/2017 0733   PROT 7.5 05/03/2017 0849   ALBUMIN 3.7 07/28/2017 0733   ALBUMIN 3.8 05/03/2017 0849   AST 50 (H) 07/28/2017 0733   AST 36 (H) 05/03/2017 0849   ALT 68 (H) 07/28/2017 0733   ALT 52 05/03/2017 0849   ALKPHOS 115 07/28/2017 0733   ALKPHOS 85 05/03/2017 0849   BILITOT 0.6 07/28/2017 0733   BILITOT 0.73 05/03/2017 0849   GFRNONAA >60 07/28/2017  0733   GFRAA >60 07/28/2017 0733    Lab Results  Component Value Date   CEA1 <1.00 06/14/2017     Medications: I have reviewed the patient's current medications.   Assessment/Plan: 1. 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 CTschest, abdomen, and pelvis on 12/11/2015-indeterminate tiny pulmonary nodules, multiple liver metastases, small nodes in the sigmoid mesocolon  3. Laparoscopic sigmoid colectomy, liver biopsy, Port-A-Cath placement 01/14/2016  4. Pathology sigmoid colon resection-colonic adenocarcinoma, 5.3 cm extending into pericolonic connective tissue, positive lymph-vascular invasion, positive perineural invasion, negative margins, metastatic carcinoma in9 of 28 lymph nodes  5. Pathology liver biopsy-metastatic colorectal adenocarcinoma  6. MSI stable; mismatch repair protein normal  7. 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 Cycle 1 FOLFOX 02/02/2016  9. Cycle 2 FOLFOX 02/15/2016  10. Cycle 3 FOLFOX 02/29/2016  11. Cycle 4 FOLFOX 03/14/2016  12. Cycle 5 FOLFOX 03/28/2016  13. Cycle 6 FOLFOX 04/11/2016 (oxaliplatin held secondary to thrombocytopenia)  14. 04/21/2016 restaging CTs-difficulty evaluating liver lesions due to hepatic steatosis. Stable right adrenal nodule. No adenopathy or local recurrence near the rectosigmoid anastomotic site.  15. Cycle 7 FOLFOX 04/25/2016  16. MRI liver 05/02/2016-partial improvement in hepatic metastases  17.  Cycle 8 FOLFOX 05/10/2016  18. Cycle 9 FOLFOX 05/23/2016 (oxaliplatin held due to thrombocytopenia)  19. Cycle 10 FOLFOX 06/06/2016 (oxaliplatin dose reduced due to thrombocytopenia)  20. Cycle 11 FOLFOX 06/27/2016 (oxaliplatin held due to neuropathy)  21. Cycle 12 FOLFOX 07/11/2016 (oxaliplatin held) 22. Initiation of maintenance Xeloda 7 days on/7 days off 07/27/2016 23. MRI liver 11/18/2016-decrease in hepatic  metastatic disease. No new or progressive disease identified within the abdomen. 24. Continuation of Xeloda 7 days on/7 days off 25. MRI liver 04/27/2017-previous liver lesions not identified, no new lesions, no lymphadenopathy 26. Xeloda continued 7 days on/7 days off  2. Rectal bleeding and constipation secondary to #1  3. History of peripheral vascular disease, status post left lower extremity vascular bypass surgery in April 2017  4. History of nephrolithiasis  5. History of Graves' disease treated with radioactive iodine  6. Anxiety/depression  7. Hypertension  8. Hospitalization 01/19/2016 with wound dehiscence status post secondary suture closure of abdominal wall  9. 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 Hyperglycemia 06/20/2016-diagnosed with diabetes, maintained on insulin   Disposition: Jeff [name] appears well.  He is tolerating the Xeloda without significant toxicity.  There is no clinical evidence for progression of the metastatic colon cancer.  We will follow-up on the CEA from today.  Jeff [name] will return for office visit and Port-A-Cath flush in 6 weeks.  15 minutes were spent with the patient today.  The majority of the time was used for counseling and coordination of care.  Betsy Coder, MD  07/28/2017  9:27 AM

## 2017-07-28 NOTE — Telephone Encounter (Signed)
Printed avs and calender of upcoming appointment.per 3/15 los

## 2017-08-07 ENCOUNTER — Other Ambulatory Visit: Payer: Self-pay | Admitting: Pharmacist

## 2017-08-15 ENCOUNTER — Other Ambulatory Visit: Payer: Self-pay | Admitting: Oncology

## 2017-08-15 DIAGNOSIS — C187 Malignant neoplasm of sigmoid colon: Secondary | ICD-10-CM

## 2017-08-16 ENCOUNTER — Other Ambulatory Visit: Payer: Self-pay | Admitting: *Deleted

## 2017-08-16 ENCOUNTER — Other Ambulatory Visit: Payer: Self-pay | Admitting: Endocrinology

## 2017-08-16 MED ORDER — ATORVASTATIN CALCIUM 80 MG PO TABS: 80.0000 mg | ORAL_TABLET | Freq: Every evening | ORAL | 0 refills | Status: DC

## 2017-08-17 NOTE — Telephone Encounter (Signed)
Received call from [facility] requesing refill on xeloda.  Pt wants to pick up tomorrow.  Message routed to DR Sherrill/Pod RN.

## 2017-08-18 MED FILL — CAPECITABINE 500 MG TABLET: 500 | 28 days supply | Qty: 112 | Fill #0

## 2017-08-18 MED FILL — UNIFINE PENTIPS 31GX3/16: 31G X 5 MM | 50 days supply | Qty: 100 | Fill #2

## 2017-08-18 MED FILL — UNIFINE PENTIPS 31GX3/16": 31G X 5 MM | 50 days supply | Qty: 100 | Fill #2

## 2017-08-18 MED FILL — NOVOLOG FLEXPEN SYRINGE: 100 | 30 days supply | Qty: 30 | Fill #4

## 2017-09-05 ENCOUNTER — Other Ambulatory Visit: Payer: Self-pay | Admitting: Nurse Practitioner

## 2017-09-05 DIAGNOSIS — C187 Malignant neoplasm of sigmoid colon: Secondary | ICD-10-CM

## 2017-09-05 DIAGNOSIS — C189 Malignant neoplasm of colon, unspecified: Secondary | ICD-10-CM

## 2017-09-05 DIAGNOSIS — C787 Secondary malignant neoplasm of liver and intrahepatic bile duct: Secondary | ICD-10-CM

## 2017-09-07 ENCOUNTER — Other Ambulatory Visit: Payer: Self-pay | Admitting: Oncology

## 2017-09-07 DIAGNOSIS — C787 Secondary malignant neoplasm of liver and intrahepatic bile duct: Secondary | ICD-10-CM

## 2017-09-07 DIAGNOSIS — C187 Malignant neoplasm of sigmoid colon: Secondary | ICD-10-CM

## 2017-09-07 DIAGNOSIS — C189 Malignant neoplasm of colon, unspecified: Secondary | ICD-10-CM

## 2017-09-11 ENCOUNTER — Other Ambulatory Visit: Payer: Self-pay | Admitting: Oncology

## 2017-09-11 DIAGNOSIS — C187 Malignant neoplasm of sigmoid colon: Secondary | ICD-10-CM

## 2017-09-14 ENCOUNTER — Other Ambulatory Visit: Payer: Self-pay | Admitting: *Deleted

## 2017-09-14 DIAGNOSIS — C187 Malignant neoplasm of sigmoid colon: Secondary | ICD-10-CM

## 2017-09-14 MED ORDER — SODIUM CHLORIDE 0.9% FLUSH
10.0000 mL | Freq: Once | INTRAVENOUS | Status: DC
  Filled 2017-09-14: qty 10

## 2017-09-14 MED ORDER — HEPARIN SOD (PORK) LOCK FLUSH 100 UNIT/ML IV SOLN
500.0000 [IU] | Freq: Once | INTRAVENOUS | Status: DC
  Filled 2017-09-14: qty 5

## 2017-09-15 ENCOUNTER — Inpatient Hospital Stay: Payer: Medicaid Other

## 2017-09-15 ENCOUNTER — Telehealth: Payer: Self-pay | Admitting: Nurse Practitioner

## 2017-09-15 ENCOUNTER — Other Ambulatory Visit: Payer: Self-pay | Admitting: Endocrinology

## 2017-09-15 ENCOUNTER — Inpatient Hospital Stay: Payer: Medicaid Other | Attending: Nurse Practitioner | Admitting: Nurse Practitioner

## 2017-09-15 ENCOUNTER — Encounter: Payer: Self-pay | Admitting: Nurse Practitioner

## 2017-09-15 VITALS — BP 146/92 | HR 85 | Temp 98.4°F | Resp 18 | Ht 70.0 in | Wt 269.6 lb

## 2017-09-15 DIAGNOSIS — I739 Peripheral vascular disease, unspecified: Secondary | ICD-10-CM

## 2017-09-15 DIAGNOSIS — C787 Secondary malignant neoplasm of liver and intrahepatic bile duct: Secondary | ICD-10-CM | POA: Insufficient documentation

## 2017-09-15 DIAGNOSIS — C187 Malignant neoplasm of sigmoid colon: Secondary | ICD-10-CM | POA: Insufficient documentation

## 2017-09-15 DIAGNOSIS — Z794 Long term (current) use of insulin: Secondary | ICD-10-CM | POA: Diagnosis not present

## 2017-09-15 DIAGNOSIS — I10 Essential (primary) hypertension: Secondary | ICD-10-CM | POA: Insufficient documentation

## 2017-09-15 DIAGNOSIS — C189 Malignant neoplasm of colon, unspecified: Secondary | ICD-10-CM

## 2017-09-15 DIAGNOSIS — E119 Type 2 diabetes mellitus without complications: Secondary | ICD-10-CM | POA: Diagnosis not present

## 2017-09-15 LAB — CMP (CANCER CENTER ONLY)
ALT: 77 U/L — ABNORMAL HIGH (ref 0–55)
AST: 54 U/L — ABNORMAL HIGH (ref 5–34)
Albumin: 3.9 g/dL (ref 3.5–5.0)
Alkaline Phosphatase: 144 U/L (ref 40–150)
Anion gap: 10 (ref 3–11)
BUN: 8 mg/dL (ref 7–26)
CO2: 24 mmol/L (ref 22–29)
Calcium: 9.1 mg/dL (ref 8.4–10.4)
Chloride: 102 mmol/L (ref 98–109)
Creatinine: 1.23 mg/dL (ref 0.70–1.30)
GFR, Est AFR Am: >60 mL/min (ref >60)
GFR, Estimated: >60 mL/min (ref >60)
Glucose, Bld: 329 mg/dL — ABNORMAL HIGH (ref 70–140)
Potassium: 3.6 mmol/L (ref 3.5–5.1)
Sodium: 136 mmol/L (ref 136–145)
Total Bilirubin: 0.5 mg/dL (ref 0.2–1.2)
Total Protein: 7.5 g/dL (ref 6.4–8.3)

## 2017-09-15 LAB — CBC WITH DIFFERENTIAL (CANCER CENTER ONLY)
Basophils Absolute: 0 10*3/uL (ref 0.0–0.1)
Basophils Relative: 0 %
Eosinophils Absolute: 0.3 10*3/uL (ref 0.0–0.5)
Eosinophils Relative: 4 %
HCT: 42.4 % (ref 38.4–49.9)
Hemoglobin: 13.8 g/dL (ref 13.0–17.1)
Lymphocytes Relative: 37 %
Lymphs Abs: 2.7 10*3/uL (ref 0.9–3.3)
MCH: 29.9 pg (ref 27.2–33.4)
MCHC: 32.5 g/dL (ref 32.0–36.0)
MCV: 91.8 fL (ref 79.3–98.0)
Monocytes Absolute: 0.7 10*3/uL (ref 0.1–0.9)
Monocytes Relative: 9 %
Neutro Abs: 3.6 10*3/uL (ref 1.5–6.5)
Neutrophils Relative %: 50 %
Platelet Count: 137 10*3/uL — ABNORMAL LOW (ref 140–400)
RBC: 4.62 MIL/uL (ref 4.20–5.82)
RDW: 17.4 % — ABNORMAL HIGH (ref 11.0–14.6)
WBC Count: 7.2 10*3/uL (ref 4.0–10.3)

## 2017-09-15 LAB — CEA (IN HOUSE-CHCC): CEA (CHCC-In House): 1.28 ng/mL (ref 0.00–5.00)

## 2017-09-15 MED ORDER — SODIUM CHLORIDE 0.9% FLUSH
10.0000 mL | INTRAVENOUS | Status: DC | PRN
  Administered 2017-09-15: 10 mL via INTRAVENOUS
  Filled 2017-09-15: qty 10

## 2017-09-15 MED ORDER — HEPARIN SOD (PORK) LOCK FLUSH 100 UNIT/ML IV SOLN
500.0000 [IU] | Freq: Once | INTRAVENOUS | Status: AC
  Administered 2017-09-15: 500 [IU] via INTRAVENOUS
  Filled 2017-09-15: qty 5

## 2017-09-15 MED FILL — CAPECITABINE 500 MG TABLET: 500 | 28 days supply | Qty: 112 | Fill #0

## 2017-09-15 NOTE — Progress Notes (Signed)
[place] OFFICE PROGRESS NOTE   Diagnosis: Colon cancer  INTERVAL HISTORY:   Mr. Audino returns as scheduled. He continues Xeloda 7 days on/7 days off.  He feels well.  He denies nausea/vomiting.  No mouth sores.  No diarrhea.  No hand or foot pain or redness.  He has a good appetite.  He denies pain.  Objective:  Vital signs in last 24 hours:  Blood pressure (!) 146/92, pulse 85, temperature 98.4 F (36.9 C), resp. rate 18, height '5\' 10"'$  (1.778 m), weight 269 lb 9.6 oz (122.3 kg), SpO2 97 %.    HEENT: No thrush or ulcers. Resp: Lungs clear bilaterally. Cardio: Regular rate and rhythm. GI: Abdomen soft and nontender.  No hepatomegaly. Vascular: No leg edema. Neuro: Alert and oriented. Skin: Palms without erythema. Port-A-Cath without erythema.   Lab Results:  Lab Results  Component Value Date   WBC 7.2 09/15/2017   HGB 13.8 09/15/2017   HCT 42.4 09/15/2017   MCV 91.8 09/15/2017   PLT 137 (L) 09/15/2017   NEUTROABS 3.6 09/15/2017    Imaging:  No results found.  Medications: I have reviewed the patient's current medications.  Assessment/Plan: 1. 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 CTschest, abdomen, and pelvis on 12/11/2015-indeterminate tiny pulmonary nodules, multiple liver metastases, small nodes in the sigmoid mesocolon  3. Laparoscopic sigmoid colectomy, liver biopsy, Port-A-Cath placement 01/14/2016  4. Pathology sigmoid colon resection-colonic adenocarcinoma, 5.3 cm extending into pericolonic connective tissue, positive lymph-vascular invasion, positive perineural invasion, negative margins, metastatic carcinoma in9 of 28 lymph nodes  5. Pathology liver biopsy-metastatic colorectal adenocarcinoma  6. MSI stable; mismatch repair protein normal  7. 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 Cycle 1 FOLFOX 02/02/2016  9. Cycle 2  FOLFOX 02/15/2016  10. Cycle 3 FOLFOX 02/29/2016  11. Cycle 4 FOLFOX 03/14/2016  12. Cycle 5 FOLFOX 03/28/2016  13. Cycle 6 FOLFOX 04/11/2016 (oxaliplatin held secondary to thrombocytopenia)  14. 04/21/2016 restaging CTs-difficulty evaluating liver lesions due to hepatic steatosis. Stable right adrenal nodule. No adenopathy or local recurrence near the rectosigmoid anastomotic site.  15. Cycle 7 FOLFOX 04/25/2016  16. MRI liver 05/02/2016-partial improvement in hepatic metastases  17. Cycle 8 FOLFOX 05/10/2016  18. Cycle 9 FOLFOX 05/23/2016 (oxaliplatin held due to thrombocytopenia)  19. Cycle 10 FOLFOX 06/06/2016 (oxaliplatin dose reduced due to thrombocytopenia)  20. Cycle 11 FOLFOX 06/27/2016 (oxaliplatin held due to neuropathy)  21. Cycle 12 FOLFOX 07/11/2016 (oxaliplatin held) 22. Initiation of maintenance Xeloda 7 days on/7 days off 07/27/2016 23. MRI liver 11/18/2016-decrease in hepatic metastatic disease. No new or progressive disease identified within the abdomen. 24. Continuation of Xeloda 7 days on/7 days off 25. MRI liver 04/27/2017-previous liver lesions not identified, no new lesions, no lymphadenopathy 26. Xeloda continued 7 days on/7 days off  2. Rectal bleeding and constipation secondary to #1  3. History of peripheral vascular disease, status post left lower extremity vascular bypass surgery in April 2017  4. History of nephrolithiasis  5. History of Graves' disease treated with radioactive iodine  6. Anxiety/depression  7. Hypertension  8. Hospitalization 01/19/2016 with wound dehiscence status post secondary suture closure of abdominal wall  9. 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 Hyperglycemia 06/20/2016-diagnosed with diabetes, maintained on insulin    Disposition: Mr. Rosenberg appears stable.  There is no clinical evidence of progression of the metastatic colon cancer.  He will continue Xeloda 7 days on/7 days off.  He will undergo a restaging MRI of the liver just prior to his next visit in 6 weeks which will be at a 27-monthinterval from the previous MRI.  He will return for lab, Port-A-Cath flush and a follow-up visit on 10/27/2017.  He will contact the office in the interim with any problems.    LNed CardANP/GNP-BC   09/15/2017  10:12 AM

## 2017-09-15 NOTE — Telephone Encounter (Signed)
Scheduled appt per 5/3 los - [facility] to contact pt with MRI . Pt aware of appts not print out wanted - my chart active.

## 2017-09-30 ENCOUNTER — Other Ambulatory Visit: Payer: Self-pay | Admitting: Oncology

## 2017-09-30 DIAGNOSIS — C189 Malignant neoplasm of colon, unspecified: Secondary | ICD-10-CM

## 2017-09-30 DIAGNOSIS — C187 Malignant neoplasm of sigmoid colon: Secondary | ICD-10-CM

## 2017-09-30 DIAGNOSIS — C787 Secondary malignant neoplasm of liver and intrahepatic bile duct: Secondary | ICD-10-CM

## 2017-10-10 ENCOUNTER — Other Ambulatory Visit: Payer: Self-pay | Admitting: Oncology

## 2017-10-10 ENCOUNTER — Other Ambulatory Visit: Payer: Self-pay | Admitting: Endocrinology

## 2017-10-10 DIAGNOSIS — C187 Malignant neoplasm of sigmoid colon: Secondary | ICD-10-CM

## 2017-10-12 ENCOUNTER — Other Ambulatory Visit: Payer: Self-pay | Admitting: Endocrinology

## 2017-10-13 MED FILL — TAMSULOSIN HCL 0.4 MG CAP: 0.4 | 30 days supply | Qty: 30 | Fill #0

## 2017-10-13 MED FILL — CAPECITABINE 500 MG TABLET: 500 | 28 days supply | Qty: 112 | Fill #0

## 2017-10-13 MED FILL — NOVOLOG FLEXPEN SYRINGE: 100 | 30 days supply | Qty: 30 | Fill #0

## 2017-10-17 ENCOUNTER — Other Ambulatory Visit: Payer: Self-pay | Admitting: Endocrinology

## 2017-10-19 ENCOUNTER — Other Ambulatory Visit: Payer: Self-pay | Admitting: Endocrinology

## 2017-10-20 ENCOUNTER — Telehealth: Payer: Self-pay | Admitting: Emergency Medicine

## 2017-10-20 NOTE — Telephone Encounter (Signed)
Called pt and left VM. Pt is to not have scan done at [facility] on Monday. Called [facility] to cancel scan but they were already closed. Left detailed message on pts VM to only keep [facility] appts on 6/14 and we will revisit when scan should be rescheduled per Dr.Sherrill.

## 2017-10-23 ENCOUNTER — Inpatient Hospital Stay: Admission: RE | Admit: 2017-10-23 | Payer: Medicaid Other | Source: Ambulatory Visit

## 2017-10-27 ENCOUNTER — Telehealth: Payer: Self-pay

## 2017-10-27 ENCOUNTER — Inpatient Hospital Stay: Payer: Medicaid Other | Attending: Nurse Practitioner | Admitting: Oncology

## 2017-10-27 ENCOUNTER — Inpatient Hospital Stay: Payer: Medicaid Other

## 2017-10-27 VITALS — BP 142/99 | HR 87 | Temp 98.3°F | Resp 18 | Ht 70.0 in | Wt 265.4 lb

## 2017-10-27 DIAGNOSIS — Z95828 Presence of other vascular implants and grafts: Secondary | ICD-10-CM

## 2017-10-27 DIAGNOSIS — C187 Malignant neoplasm of sigmoid colon: Secondary | ICD-10-CM

## 2017-10-27 DIAGNOSIS — Z794 Long term (current) use of insulin: Secondary | ICD-10-CM | POA: Insufficient documentation

## 2017-10-27 DIAGNOSIS — C787 Secondary malignant neoplasm of liver and intrahepatic bile duct: Secondary | ICD-10-CM | POA: Diagnosis present

## 2017-10-27 DIAGNOSIS — E119 Type 2 diabetes mellitus without complications: Secondary | ICD-10-CM | POA: Insufficient documentation

## 2017-10-27 DIAGNOSIS — I10 Essential (primary) hypertension: Secondary | ICD-10-CM

## 2017-10-27 DIAGNOSIS — C189 Malignant neoplasm of colon, unspecified: Secondary | ICD-10-CM

## 2017-10-27 LAB — CBC WITH DIFFERENTIAL (CANCER CENTER ONLY)
Basophils Absolute: 0 10*3/uL (ref 0.0–0.1)
Basophils Relative: 0 %
Eosinophils Absolute: 0.3 10*3/uL (ref 0.0–0.5)
Eosinophils Relative: 4 %
HCT: 44 % (ref 38.4–49.9)
Hemoglobin: 14.3 g/dL (ref 13.0–17.1)
Lymphocytes Relative: 45 %
Lymphs Abs: 3.1 10*3/uL (ref 0.9–3.3)
MCH: 29.8 pg (ref 27.2–33.4)
MCHC: 32.5 g/dL (ref 32.0–36.0)
MCV: 91.7 fL (ref 79.3–98.0)
Monocytes Absolute: 0.6 10*3/uL (ref 0.1–0.9)
Monocytes Relative: 8 %
Neutro Abs: 3 10*3/uL (ref 1.5–6.5)
Neutrophils Relative %: 43 %
Platelet Count: 130 10*3/uL — ABNORMAL LOW (ref 140–400)
RBC: 4.79 MIL/uL (ref 4.20–5.82)
RDW: 18.9 % — ABNORMAL HIGH (ref 11.0–14.6)
WBC Count: 7 10*3/uL (ref 4.0–10.3)

## 2017-10-27 LAB — CMP (CANCER CENTER ONLY)
ALT: 81 U/L — ABNORMAL HIGH (ref 0–55)
AST: 66 U/L — ABNORMAL HIGH (ref 5–34)
Albumin: 4 g/dL (ref 3.5–5.0)
Alkaline Phosphatase: 118 U/L (ref 40–150)
Anion gap: 12 — ABNORMAL HIGH (ref 3–11)
BUN: 8 mg/dL (ref 7–26)
CO2: 25 mmol/L (ref 22–29)
Calcium: 8.8 mg/dL (ref 8.4–10.4)
Chloride: 102 mmol/L (ref 98–109)
Creatinine: 1.11 mg/dL (ref 0.70–1.30)
GFR, Est AFR Am: >60 mL/min (ref >60)
GFR, Estimated: >60 mL/min (ref >60)
Glucose, Bld: 149 mg/dL — ABNORMAL HIGH (ref 70–140)
Potassium: 3.6 mmol/L (ref 3.5–5.1)
Sodium: 139 mmol/L (ref 136–145)
Total Bilirubin: 0.6 mg/dL (ref 0.2–1.2)
Total Protein: 7.6 g/dL (ref 6.4–8.3)

## 2017-10-27 LAB — CEA (IN HOUSE-CHCC): CEA (CHCC-In House): 1.52 ng/mL (ref 0.00–5.00)

## 2017-10-27 MED ORDER — SODIUM CHLORIDE 0.9% FLUSH
10.0000 mL | INTRAVENOUS | Status: DC | PRN
  Administered 2017-10-27: 10 mL via INTRAVENOUS
  Filled 2017-10-27: qty 10

## 2017-10-27 MED ORDER — HEPARIN SOD (PORK) LOCK FLUSH 100 UNIT/ML IV SOLN
500.0000 [IU] | Freq: Once | INTRAVENOUS | Status: AC | PRN
  Administered 2017-10-27: 500 [IU] via INTRAVENOUS
  Filled 2017-10-27: qty 5

## 2017-10-27 NOTE — Progress Notes (Signed)
[place] OFFICE PROGRESS NOTE   Diagnosis: Colon cancer  INTERVAL HISTORY:   Jeff [name] returns as scheduled.  He continues Xeloda.  He completed the most recent week of Xeloda on 10/24/2017.  No mouth sores, diarrhea, or hand/foot pain.  He feels well. He has not undergone the restaging MRI secondary to a delay in insurance approval.  Objective:  Vital signs in last 24 hours:  Blood pressure (!) 142/99, pulse 87, temperature 98.3 F (36.8 C), temperature source Oral, resp. rate 18, height '5\' 10"'$  (1.778 m), weight 265 lb 6.4 oz (120.4 kg), SpO2 100 %.    HEENT: No thrush or ulcers Lymphatics: No cervical or supraclavicular nodes Resp: Lungs clear bilaterally Cardio: Regular rate and rhythm GI: No hepatomegaly, nontender Vascular: No leg edema  Skin: Dryness of the soles, no erythema  Portacath/PICC-without erythema  Lab Results:  Lab Results  Component Value Date   WBC 7.0 10/27/2017   HGB 14.3 10/27/2017   HCT 44.0 10/27/2017   MCV 91.7 10/27/2017   PLT 130 (L) 10/27/2017   NEUTROABS 3.0 10/27/2017    CMP  Lab Results  Component Value Date   NA 136 09/15/2017   K 3.6 09/15/2017   CL 102 09/15/2017   CO2 24 09/15/2017   GLUCOSE 329 (H) 09/15/2017   BUN 8 09/15/2017   CREATININE 1.23 09/15/2017   CALCIUM 9.1 09/15/2017   PROT 7.5 09/15/2017   ALBUMIN 3.9 09/15/2017   AST 54 (H) 09/15/2017   ALT 77 (H) 09/15/2017   ALKPHOS 144 09/15/2017   BILITOT 0.5 09/15/2017   GFRNONAA >60 09/15/2017   GFRAA >60 09/15/2017    Lab Results  Component Value Date   CEA1 1.28 09/15/2017     Medications: I have reviewed the patient's current medications.   Assessment/Plan: 1. 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 CTschest, abdomen, and pelvis on 12/11/2015-indeterminate tiny pulmonary nodules, multiple liver metastases, small nodes in the sigmoid  mesocolon  3. Laparoscopic sigmoid colectomy, liver biopsy, Port-A-Cath placement 01/14/2016  4. Pathology sigmoid colon resection-colonic adenocarcinoma, 5.3 cm extending into pericolonic connective tissue, positive lymph-vascular invasion, positive perineural invasion, negative margins, metastatic carcinoma in9 of 28 lymph nodes  5. Pathology liver biopsy-metastatic colorectal adenocarcinoma  6. MSI stable; mismatch repair protein normal  7. 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 Cycle 1 FOLFOX 02/02/2016  9. Cycle 2 FOLFOX 02/15/2016  10. Cycle 3 FOLFOX 02/29/2016  11. Cycle 4 FOLFOX 03/14/2016  12. Cycle 5 FOLFOX 03/28/2016  13. Cycle 6 FOLFOX 04/11/2016 (oxaliplatin held secondary to thrombocytopenia)  14. 04/21/2016 restaging CTs-difficulty evaluating liver lesions due to hepatic steatosis. Stable right adrenal nodule. No adenopathy or local recurrence near the rectosigmoid anastomotic site.  15. Cycle 7 FOLFOX 04/25/2016  16. MRI liver 05/02/2016-partial improvement in hepatic metastases  17. Cycle 8 FOLFOX 05/10/2016  18. Cycle 9 FOLFOX 05/23/2016 (oxaliplatin held due to thrombocytopenia)  19. Cycle 10 FOLFOX 06/06/2016 (oxaliplatin dose reduced due to thrombocytopenia)  20. Cycle 11 FOLFOX 06/27/2016 (oxaliplatin held due to neuropathy)  21. Cycle 12 FOLFOX 07/11/2016 (oxaliplatin held) 22. Initiation of maintenance Xeloda 7 days on/7 days off 07/27/2016 23. MRI liver 11/18/2016-decrease in hepatic metastatic disease. No new or progressive disease identified within the abdomen. 24. Continuation of Xeloda 7 days on/7 days off 25. MRI liver 04/27/2017-previous liver lesions not identified, no new lesions, no lymphadenopathy 26. Xeloda continued 7 days on/7 days off  2. Rectal bleeding and constipation secondary to #1  3. History of peripheral vascular disease, status post left lower extremity vascular bypass surgery in April 2017  4. History of  nephrolithiasis  5. History of Graves' disease treated with radioactive iodine  6. Anxiety/depression  7. Hypertension  8. Hospitalization 01/19/2016 with wound dehiscence status post secondary suture closure of abdominal wall  9. 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 Hyperglycemia 06/20/2016-diagnosed with diabetes, maintained on insulin    Disposition: Jeff [name] appears stable.  He is tolerating the Xeloda well.  We had to contact the insurance company on multiple occasions to obtain approval for the restaging MRI.  He will be scheduled for the liver MRI within the next few weeks.  Jeff [name] will return for an office visit and Port-A-Cath flush in 6 weeks.  We will see him sooner based on the MRI findings.  He will follow-up with his primary physician for management of hypertension.  15 minutes were spent with the patient today.  The majority of the time was used for counseling and coordination of care.  Betsy Coder, MD  10/27/2017  9:09 AM

## 2017-10-27 NOTE — Telephone Encounter (Signed)
Printed avs and calender of upcoming appointments. Per 6/14 los

## 2017-10-31 ENCOUNTER — Other Ambulatory Visit: Payer: Self-pay | Admitting: Oncology

## 2017-10-31 DIAGNOSIS — C189 Malignant neoplasm of colon, unspecified: Secondary | ICD-10-CM

## 2017-10-31 DIAGNOSIS — C187 Malignant neoplasm of sigmoid colon: Secondary | ICD-10-CM

## 2017-10-31 DIAGNOSIS — C787 Secondary malignant neoplasm of liver and intrahepatic bile duct: Secondary | ICD-10-CM

## 2017-11-03 ENCOUNTER — Telehealth: Payer: Self-pay

## 2017-11-03 ENCOUNTER — Other Ambulatory Visit: Payer: Self-pay | Admitting: Pharmacist

## 2017-11-03 NOTE — Telephone Encounter (Signed)
LVM regarding labs and upcoming scan. No new lab work is needed prior to the MRI so the appts on 11/10/17 have been cancelled per pt request. Provided the number for [place] for pt to call and schedule MRI as best fits their schedule within the next 2 weeks. Left clinic number to call back with any questions/concerns.

## 2017-11-03 NOTE — Telephone Encounter (Signed)
Called to inform patient that his appointment time was pushed back due unable to r/s flush in early am. Per 6/21 resource corrections. Mailed letter of these changes also

## 2017-11-03 NOTE — Telephone Encounter (Signed)
LVM for wife to return call to clinic

## 2017-11-07 ENCOUNTER — Other Ambulatory Visit: Payer: Self-pay | Admitting: Oncology

## 2017-11-07 DIAGNOSIS — C187 Malignant neoplasm of sigmoid colon: Secondary | ICD-10-CM

## 2017-11-10 ENCOUNTER — Other Ambulatory Visit: Payer: Medicaid Other

## 2017-11-10 ENCOUNTER — Inpatient Hospital Stay: Payer: Medicaid Other

## 2017-11-10 MED FILL — NOVOLOG FLEXPEN SYRINGE: 100 | 30 days supply | Qty: 30 | Fill #1

## 2017-11-10 MED FILL — UNIFINE PENTIPS 31GX3/16: 31G X 5 MM | 34 days supply | Qty: 100 | Fill #0

## 2017-11-10 MED FILL — UNIFINE PENTIPS 31GX3/16": 31G X 5 MM | 34 days supply | Qty: 100 | Fill #0

## 2017-11-10 MED FILL — CAPECITABINE 500 MG TABLET: 500 | 28 days supply | Qty: 112 | Fill #0

## 2017-11-15 ENCOUNTER — Other Ambulatory Visit: Payer: Self-pay

## 2017-11-15 ENCOUNTER — Ambulatory Visit
Admission: RE | Admit: 2017-11-15 | Discharge: 2017-11-15 | Disposition: A | Discharge status: Home / Self Care | Payer: Medicaid Other | Source: Ambulatory Visit | Attending: Nurse Practitioner | Admitting: Nurse Practitioner

## 2017-11-15 ENCOUNTER — Other Ambulatory Visit: Payer: Self-pay | Admitting: Nurse Practitioner

## 2017-11-15 DIAGNOSIS — D490 Neoplasm of unspecified behavior of digestive system: Secondary | ICD-10-CM

## 2017-11-15 DIAGNOSIS — C787 Secondary malignant neoplasm of liver and intrahepatic bile duct: Secondary | ICD-10-CM

## 2017-11-15 DIAGNOSIS — C189 Malignant neoplasm of colon, unspecified: Secondary | ICD-10-CM

## 2017-11-15 MED ORDER — ATORVASTATIN CALCIUM 80 MG PO TABS: 80.0000 mg | ORAL_TABLET | Freq: Every evening | ORAL | 0 refills | Status: DC

## 2017-12-01 ENCOUNTER — Other Ambulatory Visit: Payer: Self-pay | Admitting: Oncology

## 2017-12-01 ENCOUNTER — Other Ambulatory Visit: Payer: Self-pay | Admitting: Endocrinology

## 2017-12-01 DIAGNOSIS — C187 Malignant neoplasm of sigmoid colon: Secondary | ICD-10-CM

## 2017-12-04 ENCOUNTER — Other Ambulatory Visit: Payer: Self-pay

## 2017-12-04 ENCOUNTER — Ambulatory Visit (INDEPENDENT_AMBULATORY_CARE_PROVIDER_SITE_OTHER)
Admission: RE | Admit: 2017-12-04 | Discharge: 2017-12-04 | Disposition: A | Discharge status: Home / Self Care | Payer: Medicaid Other | Source: Ambulatory Visit | Attending: Surgery | Admitting: Surgery

## 2017-12-04 ENCOUNTER — Ambulatory Visit
Admission: RE | Admit: 2017-12-04 | Discharge: 2017-12-04 | Disposition: A | Discharge status: Home / Self Care | Payer: Medicaid Other | Source: Ambulatory Visit | Attending: Oncology | Admitting: Oncology

## 2017-12-04 ENCOUNTER — Ambulatory Visit (HOSPITAL_COMMUNITY)
Admission: RE | Admit: 2017-12-04 | Discharge: 2017-12-04 | Disposition: A | Discharge status: Home / Self Care | Payer: Medicaid Other | Source: Ambulatory Visit | Attending: Surgery | Admitting: Surgery

## 2017-12-04 ENCOUNTER — Ambulatory Visit (INDEPENDENT_AMBULATORY_CARE_PROVIDER_SITE_OTHER): Payer: Medicaid Other | Admitting: Family

## 2017-12-04 ENCOUNTER — Encounter: Payer: Self-pay | Admitting: Family

## 2017-12-04 VITALS — BP 126/89 | HR 95 | Temp 97.0°F | Resp 16 | Ht 70.0 in | Wt 268.2 lb

## 2017-12-04 DIAGNOSIS — Z95828 Presence of other vascular implants and grafts: Secondary | ICD-10-CM

## 2017-12-04 DIAGNOSIS — E785 Hyperlipidemia, unspecified: Secondary | ICD-10-CM | POA: Diagnosis not present

## 2017-12-04 DIAGNOSIS — E1151 Type 2 diabetes mellitus with diabetic peripheral angiopathy without gangrene: Secondary | ICD-10-CM | POA: Insufficient documentation

## 2017-12-04 DIAGNOSIS — Z48812 Encounter for surgical aftercare following surgery on the circulatory system: Secondary | ICD-10-CM | POA: Insufficient documentation

## 2017-12-04 DIAGNOSIS — I779 Disorder of arteries and arterioles, unspecified: Secondary | ICD-10-CM | POA: Diagnosis not present

## 2017-12-04 DIAGNOSIS — I70212 Atherosclerosis of native arteries of extremities with intermittent claudication, left leg: Secondary | ICD-10-CM

## 2017-12-04 DIAGNOSIS — D490 Neoplasm of unspecified behavior of digestive system: Secondary | ICD-10-CM

## 2017-12-04 MED ORDER — GADOBENATE DIMEGLUMINE 529 MG/ML IV SOLN
20.0000 mL | Freq: Once | INTRAVENOUS | Status: AC | PRN
  Administered 2017-12-04: 20 mL via INTRAVENOUS

## 2017-12-04 NOTE — Progress Notes (Signed)
[facility] [place]   CC: Follow up peripheral artery occlusive disease  History of Present Illness Jeff [name] is a 52 y.o. male who returns today for follow-up. The patient has a long-standing history of left leg claudication, beginning at approximately age 84. His symptoms were initially attributed to lower back issues, however angiography by Dr. Fletcher Anon revealed a focal occlusion within the left popliteal artery. An attempted percutaneous revascularization was performed, but was unsuccessful. This was done through an antegrade approach and the patient ended up with a large scrotal hematoma. On 09/11/2015 he underwent a left above-knee to tibioperoneal trunk bypass graft with ipsilateral non-reversed greater saphenous vein by Dr. Trula Slade. He had an excellent vein measuring approximately 5 mm. He has an early takeoff of the anterior tibial artery.  The patient does complain of some numbness in the lower part of his leg. Unfortunately in late 2017 he was diagnosed with metastatic colon cancer. He has undergone colectomy and has completed chemotherapy. He is maintained on oral medication currently. He does appear to be recovering from this quite well.  From a cardiac perspective, the patient has a history of an MI but at coronary angiography this was attributed to coronary spasm. He is medically managed for hypercholesterolemia with a statin. He is medically managed for hypertension. He is a nonsmoker. He has a hx of  Colon cancer, had a colectomy, had chemo. He is in remission.   The pt was last evaluated by Dr. Trula Slade on 10-24-16. At that time the patient was status post left leg bypass for severe claudication and known popliteal occlusion. When Dr. Trula Slade saw him in June of2017 he recommended surveillance ultrasound imaging of his bypass graft. Medicaid has requested that [facility] evaluate the patient again before approval of the ultrasound is granted. The  patient needs routine surveillance imaging of his bypass graft based on our protocol. Dr. Trula Slade advised that  pt return within the next 4-6 weeks with a duplex of the bypass graft as well as ABIs, and pt returns today for this.  Jeff [name] denies any claudication symptoms in his legs with walking.   He denies any known history of stroke or TIA.   Pt Diabetic: Yes, last A1C result on file was 6.5 on 02-07-17 Pt smoker: non-smoker  Pt meds include: Statin :Yes Betablocker: Yes ASA: Yes Other anticoagulants/antiplatelets: no    Past Medical History:  Diagnosis Date  . Allergic rhinitis   . Anxiety   . Arthritis   . Asthma   . At risk for sleep apnea    STOP-BANG= 6       SENT TO PCP 01-22-2015  . Chronic back pain    "from the neck to the lower back"   . 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 Depression   . Dysthymic disorder   . ED (erectile dysfunction)   . GERD (gastroesophageal reflux disease)   . History of acute pyelonephritis    01-07-2015  . History of Graves' disease    vs  Multinodular  . History of hiatal hernia   . History of kidney stones   . History of non-ST elevation myocardial infarction (NSTEMI)    Jan 2014--  no CAD;  per notes probable coronary vasospasm  . History of panic attacks   . History of septic shock    01-07-2015--  ureterolithias/ pyelonephritis  .  History of thyroid storm    Nov 2011  . Hyperlipidemia   . Hypertension   . Hypothyroidism following radioiodine therapy    RAI in Mar 2012---  followed by dr Loanne Drilling  . PAD (peripheral artery disease) [facility]) cardiologist-  dr Fletcher Anon   a.  ABI (01/2014):  L 0.53; R 1.0 >> referred to [facility]  . Right ureteral stone   . Septic shock ([place]) 01/05/2015  . Sleep disturbance   . Thrombocytopenia [facility])     Social History Social History   Tobacco Use  . Smoking status: Never Smoker   . Smokeless tobacco: Never Used  Substance Use Topics  . Alcohol use: Yes    Alcohol/week: 0.0 oz    Comment: RARE  . Drug use: No    Family History Family History  Problem Relation Age of Onset  . Thyroid disease Mother        hypothyroidism  . Heart attack Maternal Grandfather   . Heart Problems Father        pacermaker  . Edema Father   . Heart disease Maternal Grandmother   . Lung cancer Maternal Grandmother   . Diabetes Maternal Grandmother   . Hypertension Maternal Grandmother     Past Surgical History:  Procedure Laterality Date  . ABDOMINAL AORTAGRAM N/A 02/19/2014   Procedure: ABDOMINAL Maxcine Ham;  Surgeon: Wellington Hampshire, MD;  Location: [place] CATH LAB;  Service: Cardiovascular;  Laterality: N/A;  . ANTERIOR CERVICAL DECOMP/DISCECTOMY FUSION  09-08-2010   C3 -4  . BACK SURGERY     x5, LOWER X 3, UPPER NECK X 2  . CYSTOSCOPY W/ URETERAL STENT PLACEMENT Right 01/04/2015   Procedure: CYSTOSCOPY WITH RETROGRADE PYELOGRAM/URETERAL STENT PLACEMENT;  Surgeon: Franchot Gallo, MD;  Location: [place];  Service: Urology;  Laterality: Right;  . CYSTOSCOPY W/ URETERAL STENT REMOVAL Right 01/26/2015   Procedure: CYSTOSCOPY WITH STENT REMOVAL;  Surgeon: Franchot Gallo, MD;  [facility];  Service: Urology;  Laterality: Right;  . CYSTOSCOPY/URETEROSCOPY/HOLMIUM LASER/STENT PLACEMENT Right 01/26/2015   Procedure: CYSTOSCOPY/URETEROSCOPY RIGHT;  Surgeon: Franchot Gallo, MD;  Location: [facility];  Service: Urology;  Laterality: Right;  . FEMORAL-POPLITEAL BYPASS GRAFT Left 09/11/2015   Procedure: BYPASS GRAFT LEFT ABOVE KNEE TO BELOW KNEE POPLITEAL ARTERY WITH LEFT GREATER SAPHENOUS VEIN;  Surgeon: Serafina Mitchell, MD;  Location: [place];  Service: Vascular;  Laterality: Left;  . INCISIONAL HERNIA REPAIR N/A 01/19/2016   Procedure: HERNIA REPAIR INCISIONAL;  Surgeon: Leighton Ruff, MD;  Location: [facility];  Service: General;  Laterality: N/A;  .  LAMINECTOMY AND MICRODISCECTOMY LUMBAR SPINE  03-26-2009   left L5 -- S1  . LAPAROSCOPIC SIGMOID COLECTOMY N/A 01/14/2016   Procedure: LAPAROSCOPIC SIGMOID COLECTOMY;  Surgeon: Leighton Ruff, MD;  Location: [facility];  Service: General;  Laterality: N/A;  . LEFT HEART CATHETERIZATION WITH CORONARY ANGIOGRAM N/A 06/12/2012   Procedure: LEFT HEART CATHETERIZATION WITH CORONARY ANGIOGRAM;  Surgeon: Josue Hector, MD;  Location: [facility] CATH LAB;  Service: Cardiovascular;  Laterality: N/A;   No sig. CAD/  normal LVF, ef 55-65%  . LIVER BIOPSY N/A 01/14/2016   Procedure: LIVER BIOPSY;  Surgeon: Leighton Ruff, MD;  Location: [facility];  Service: General;  Laterality: N/A;  . LOWER EXTREMITY ANGIOGRAM Left 05/28/2014   Procedure: LOWER EXTREMITY ANGIOGRAM;  Surgeon: Wellington Hampshire, MD;  Location: [place] CATH LAB;  Service: Cardiovascular;  Laterality: Left;  Failed PTA CTO  . POPLITEAL ARTERY ANGIOPLASTY Left 05/28/2014    dr  arida   Attempted and unsuccessful due to inability to cross the occlusionnotes   . PORTACATH PLACEMENT Right 01/14/2016   Procedure: INSERTION PORT-A-CATH;  Surgeon: Leighton Ruff, MD;  Location: [facility];  Service: General;  Laterality: Right;  . POSTERIOR CERVICAL FUSION/FORAMINOTOMY N/A 12/10/2012   Procedure: POSTERIOR CERVICAL FUSION/FORAMINOTOMY LEVEL 1;  Surgeon: Ophelia Charter, MD;  Location: [place] NEURO ORS;  Service: Neurosurgery;  Laterality: N/A;  Cervical three-four posterior cervical fusion with lateral mass screws  . POSTERIOR LUMBAR FUSION  11-11-2009;   07-15-2013   L5 -- S1;   L4-- S1  . SHOULDER ARTHROSCOPY Right 03-08-2004   debridement labral tear/  DCR/  acromioplasty  . TRANSTHORACIC ECHOCARDIOGRAM  01-05-2015   mild LVH/  ef 60-65%/  mild TR  . UMBILICAL HERNIA REPAIR  1980  . VEIN HARVEST Left 09/11/2015   Procedure: LEFT GREATER SAPHENOUS VEIN HARVEST;  Surgeon: Serafina Mitchell, MD;  Location: [place];  Service: Vascular;  Laterality: Left;    Allergies  Allergen  Reactions  . Hydrocodone Hives    Current Outpatient Medications  Medication Sig Dispense Refill  . ACCU-CHEK SOFTCLIX LANCETS lancets USE  TID TO CHECK BLOOD SUGAR LEVELS  0  . amLODipine (NORVASC) 5 MG tablet TAKE 1 TABLET BY MOUTH DAILY 90 tablet 1  . aspirin EC 81 MG tablet Take 81 mg by mouth daily.    Marland Kitchen atorvastatin (LIPITOR) 80 MG tablet Take 1 tablet (80 mg total) by mouth every evening. NEED OV. 30 tablet 0  . capecitabine (XELODA) 500 MG tablet TAKE 4 TABLETS (2,000 MG TOTAL) BY MOUTH 2 TIMES DAILY AFTER A MEAL DAYS 1-7 AND DAYS 15-21 OF 28 DAY CYCLE. 112 tablet 0  . diazepam (VALIUM) 10 MG tablet Take 1 tablet by mouth 2 (two) times daily as needed for anxiety.  1  . docusate sodium (COLACE) 100 MG capsule Take 100 mg by mouth 2 (two) times daily.    . insulin aspart (NOVOLOG FLEXPEN) 100 UNIT/ML FlexPen 45 Units into the skin 2 (two) times daily with a meal. 45 mL 5  . levothyroxine (SYNTHROID, LEVOTHROID) 150 MCG tablet TAKE 1 TABLET BY MOUTH DAILY 90 tablet 0  . lidocaine-prilocaine (EMLA) cream Apply 1 application topically as needed. Apply to portacath site 1 hour prior to use 30 g 3  . meclizine (ANTIVERT) 25 MG tablet Take 25 mg by mouth 3 (three) times daily as needed.  1  . metFORMIN (GLUCOPHAGE-XR) 500 MG 24 hr tablet Take 1,000 mg by mouth daily. With evening meal  3  . metoprolol tartrate (LOPRESSOR) 50 MG tablet TAKE 1/2 TABLET BY MOUTH TWICE DAILY 90 tablet 3  . nitroGLYCERIN (NITROSTAT) 0.4 MG SL tablet PLACE 1 TABLET UNDER THE TONGUE EVERY 5 MINUTES AS NEEDED FOR CHEST PAIN. 25 tablet 0  . NOVOLOG FLEXPEN 100 UNIT/ML FlexPen INJECT 50 UNITS INTO THE SKIN TWICE DAILY WITH A MEAL 45 mL 5  . omega-3 acid ethyl esters (LOVAZA) 1 g capsule Take 1 capsule (1 g total) by mouth 2 (two) times daily. 60 capsule 5  . oxyCODONE-acetaminophen (PERCOCET) 10-325 MG tablet Take 1 tablet by mouth every 6 (six) hours as needed for pain (for back pain). 30 tablet 0  . pantoprazole  (PROTONIX) 40 MG tablet Take 40 mg by mouth daily.   5  . prochlorperazine (COMPAZINE) 10 MG tablet TAKE 1 TABLET BY MOUTH EVERY 6 HOURS AS NEEDED FOR NAUSEA AND VOMITING 90 tablet 0  . promethazine (PHENERGAN) 12.5  MG tablet Take 1 tablet (12.5 mg total) by mouth every 6 (six) hours as needed. 30 tablet 3  . sildenafil (REVATIO) 20 MG tablet Take 20-100 mg by mouth daily as needed for erectile dysfunction.  11  . tamsulosin (FLOMAX) 0.4 MG CAPS capsule Take 0.4 mg by mouth every evening.     Marland Kitchen tiZANidine (ZANAFLEX) 4 MG tablet Take 4 mg by mouth 2 (two) times daily as needed for muscle spasms.   2  . UNIFINE PENTIPS 31G X 5 MM MISC USE TO INJECT NOVOLOG 2 TIMES DAILY 100 each 0  . Venlafaxine HCl 225 MG TB24 Take 225 mg by mouth daily.  6   No current facility-administered medications for this visit.    Facility-Administered Medications Ordered in Other Visits  Medication Dose Route Frequency Provider Last Rate Last Dose  . alteplase (CATHFLO ACTIVASE) injection 2 mg  2 mg Intracatheter Once PRN Betsy Coder B, MD      . sodium chloride flush (NS) 0.9 % injection 10 mL  10 mL Intravenous PRN Ladell Pier, MD   10 mL at 02/04/16 1431  . sodium chloride flush (NS) 0.9 % injection 10 mL  10 mL Intravenous PRN Ladell Pier, MD   10 mL at 10/18/16 4098  . sodium chloride flush (NS) 0.9 % injection 10 mL  10 mL Intravenous PRN Ladell Pier, MD   10 mL at 05/03/17 0855    ROS: See HPI for pertinent positives and negatives.   Physical Examination  Vitals:   12/04/17 1457 12/04/17 1500  BP: (!) 130/91 126/89  Pulse: 95   Resp: 16   Temp: (!) 97 F (36.1 C)   TempSrc: Oral   SpO2: 96%   Weight: 268 lb 3.2 oz (121.7 kg)   Height: 5\' 10"  (1.778 m)    Body mass index is 38.48 kg/m.  General: A&O x 3, WDWN, obese male. Gait: normal HENT: large neck, acanthosis nigricans at posterior and lateral aspects of neck  Eyes: PERRLA. Pulmonary: Respirations are non labored, CTAB,  good air movement in all fields Cardiac: regular rhythm and rate, no detected murmur.        Carotid Bruits Right Left   Negative Negative   Radial pulses are 2+ palpable bilaterally   Adominal aortic pulse is not palpable                         VASCULAR EXAM: Extremities without ischemic changes, without Gangrene; without open wounds.                                                                                                                                                       LE Pulses Right Left       FEMORAL  2+palpable  2+palpable  POPLITEAL  not palpable   1+ palpable       POSTERIOR TIBIAL  2+ palpable   2+ palpable        DORSALIS PEDIS      ANTERIOR TIBIAL 2+ palpable  2+ palpable    Abdomen: softly obese, NT, no palpable masses. Skin: no rashes, no ulcers noted, no cellulitis.  Musculoskeletal: no muscle wasting or atrophy.      Neurologic: A&O X 3; appropriate affect, Sensation is normal; MOTOR FUNCTION:  moving all extremities equally, motor strength 5/5 throughout. Speech is fluent/normal. CN 2-12 intact. Psychiatric: Thought content is normal, mood appropriate for clinical situation.    ASSESSMENT: Jeff [name] is a 52 y.o. male who is s/p left above-knee to tibioperoneal trunk bypass graft with ipsilateral non-reversed greater saphenous vein on 09/11/2015.  He has no claudication symptoms with walking, bilateral pedal pulses are 2+ palpable.  Fortunately he has never used tobacco, more recently he was diagnosed with DM which is well controlled. Another atherosclerotic risk factor for him is obesity.  He takes a daily ASA and statin.    DATA  Left LE Arterial Duplex (12-04-17): Left above the knee tibioperoneal bypass with no areas of restenosis.  All triphasic waveforms.  No change compared to the exams on 10-27-15 and 11-28-16.   ABI (Date: 12/04/2017):  R:   ABI: 1.35 (was 1.08 on 11-28-16),   PT: tri  DP:  tri  TBI:  0.95 (was 0.78)  L:   ABI: 1.27 (1.17),   PT: tri  DP: tri  TBI: 0.89 (was 0.84)  Bilateral ABI and TBI remain normal with all triphasic waveforms.     PLAN:  Based on the patient's vascular studies and examination, pt will return to clinic in 18 months with left LE arterial duplex and bilateral ABI.  I advised him to notify us if he develops concerns re the circulation in his feet or legs.   I discussed in depth with the patient the nature of atherosclerosis, and emphasized the importance of maximal medical management including strict control of blood pressure, blood glucose, and lipid levels, obtaining regular exercise, and continued cessation of smoking.  The patient is aware that without maximal medical management the underlying atherosclerotic disease process will progress, limiting the benefit of any interventions.  The patient was given information about PAD including signs, symptoms, treatment, what symptoms should prompt the patient to seek immediate medical care, and risk reduction measures to take.  Clemon Chambers, RN, MSN, FNP-C [facility] Phone: [phone]  Clinic MD: Trula Slade  12/04/17 3:30 PM

## 2017-12-04 NOTE — Patient Instructions (Signed)

## 2017-12-06 ENCOUNTER — Telehealth: Payer: Self-pay | Admitting: Emergency Medicine

## 2017-12-06 NOTE — Telephone Encounter (Addendum)
VM left for pt to call back   ----- Message from Ladell Pier, MD sent at 12/05/2017  6:08 PM EDT ----- Please call patient, MRI shows no evidence of cancer, follow-up as scheduled

## 2017-12-08 ENCOUNTER — Inpatient Hospital Stay: Payer: Medicaid Other | Attending: Nurse Practitioner | Admitting: Oncology

## 2017-12-08 ENCOUNTER — Telehealth: Payer: Self-pay | Admitting: Oncology

## 2017-12-08 ENCOUNTER — Inpatient Hospital Stay: Payer: Medicaid Other

## 2017-12-08 VITALS — BP 143/93 | HR 98 | Temp 98.2°F | Resp 18 | Ht 70.0 in | Wt 267.5 lb

## 2017-12-08 DIAGNOSIS — Z95828 Presence of other vascular implants and grafts: Secondary | ICD-10-CM

## 2017-12-08 DIAGNOSIS — C187 Malignant neoplasm of sigmoid colon: Secondary | ICD-10-CM

## 2017-12-08 DIAGNOSIS — I10 Essential (primary) hypertension: Secondary | ICD-10-CM | POA: Diagnosis not present

## 2017-12-08 DIAGNOSIS — C787 Secondary malignant neoplasm of liver and intrahepatic bile duct: Secondary | ICD-10-CM

## 2017-12-08 DIAGNOSIS — Z794 Long term (current) use of insulin: Secondary | ICD-10-CM | POA: Diagnosis not present

## 2017-12-08 DIAGNOSIS — E119 Type 2 diabetes mellitus without complications: Secondary | ICD-10-CM

## 2017-12-08 LAB — CBC WITH DIFFERENTIAL (CANCER CENTER ONLY)
Basophils Absolute: 0 10*3/uL (ref 0.0–0.1)
Basophils Relative: 0 %
Eosinophils Absolute: 0.3 10*3/uL (ref 0.0–0.5)
Eosinophils Relative: 4 %
HCT: 42.7 % (ref 38.4–49.9)
Hemoglobin: 13.9 g/dL (ref 13.0–17.1)
Lymphocytes Relative: 40 %
Lymphs Abs: 2.7 10*3/uL (ref 0.9–3.3)
MCH: 30 pg (ref 27.2–33.4)
MCHC: 32.6 g/dL (ref 32.0–36.0)
MCV: 92.2 fL (ref 79.3–98.0)
Monocytes Absolute: 0.6 10*3/uL (ref 0.1–0.9)
Monocytes Relative: 9 %
Neutro Abs: 3.2 10*3/uL (ref 1.5–6.5)
Neutrophils Relative %: 47 %
Platelet Count: 140 10*3/uL (ref 140–400)
RBC: 4.63 MIL/uL (ref 4.20–5.82)
RDW: 16.6 % — ABNORMAL HIGH (ref 11.0–14.6)
WBC Count: 6.8 10*3/uL (ref 4.0–10.3)

## 2017-12-08 LAB — CMP (CANCER CENTER ONLY)
ALT: 73 U/L — ABNORMAL HIGH (ref 0–44)
AST: 66 U/L — ABNORMAL HIGH (ref 15–41)
Albumin: 4 g/dL (ref 3.5–5.0)
Alkaline Phosphatase: 102 U/L (ref 38–126)
Anion gap: 13 (ref 5–15)
BUN: 8 mg/dL (ref 6–20)
CO2: 23 mmol/L (ref 22–32)
Calcium: 8.8 mg/dL — ABNORMAL LOW (ref 8.9–10.3)
Chloride: 101 mmol/L (ref 98–111)
Creatinine: 1.13 mg/dL (ref 0.61–1.24)
GFR, Est AFR Am: >60 mL/min (ref >60)
GFR, Estimated: >60 mL/min (ref >60)
Glucose, Bld: 201 mg/dL — ABNORMAL HIGH (ref 70–99)
Potassium: 3.5 mmol/L (ref 3.5–5.1)
Sodium: 137 mmol/L (ref 135–145)
Total Bilirubin: 0.6 mg/dL (ref 0.3–1.2)
Total Protein: 7.7 g/dL (ref 6.5–8.1)

## 2017-12-08 LAB — CEA (IN HOUSE-CHCC): CEA (CHCC-In House): 2.27 ng/mL (ref 0.00–5.00)

## 2017-12-08 MED ORDER — SODIUM CHLORIDE 0.9% FLUSH
10.0000 mL | INTRAVENOUS | Status: DC | PRN
  Administered 2017-12-08: 10 mL via INTRAVENOUS
  Filled 2017-12-08: qty 10

## 2017-12-08 MED ORDER — HEPARIN SOD (PORK) LOCK FLUSH 100 UNIT/ML IV SOLN
500.0000 [IU] | Freq: Once | INTRAVENOUS | Status: AC | PRN
  Administered 2017-12-08: 500 [IU] via INTRAVENOUS
  Filled 2017-12-08: qty 5

## 2017-12-08 MED FILL — UNIFINE PENTIPS 31GX3/16: 31G X 5 MM | 34 days supply | Qty: 100 | Fill #0

## 2017-12-08 MED FILL — CAPECITABINE 500 MG TABLET: 500 | 28 days supply | Qty: 112 | Fill #0

## 2017-12-08 MED FILL — NOVOLOG FLEXPEN SYRINGE: 100 | 30 days supply | Qty: 30 | Fill #2

## 2017-12-08 MED FILL — UNIFINE PENTIPS 31GX3/16": 31G X 5 MM | 34 days supply | Qty: 100 | Fill #0

## 2017-12-08 NOTE — Telephone Encounter (Signed)
Appointments scheduled AVS/Calendar printed per 7/26 los °

## 2017-12-08 NOTE — Progress Notes (Signed)
[facility] OFFICE PROGRESS NOTE   Diagnosis: Colon cancer  INTERVAL HISTORY:   Jeff [name] returns as scheduled.  He feels well.  He continues Xeloda.  No mouth sores, hand/foot pain, or diarrhea.  Objective:  Vital signs in last 24 hours:  Blood pressure (!) 143/93, pulse 98, temperature 98.2 F (36.8 C), temperature source Oral, resp. rate 18, height '5\' 10"'$  (1.778 m), weight 267 lb 8 oz (121.3 kg), SpO2 100 %.    HEENT: No thrush or ulcers Resp: Lungs clear bilaterally Cardio: Regular rate and rhythm GI: No hepatomegaly, nontender, no mass Vascular: The left lower leg is slightly larger than the right side, no edema or erythema  Skin: Palms without erythema  Portacath/PICC-without erythema  Lab Results:  Lab Results  Component Value Date   WBC 7.0 10/27/2017   HGB 14.3 10/27/2017   HCT 44.0 10/27/2017   MCV 91.7 10/27/2017   PLT 130 (L) 10/27/2017   NEUTROABS 3.0 10/27/2017    CMP  Lab Results  Component Value Date   NA 139 10/27/2017   K 3.6 10/27/2017   CL 102 10/27/2017   CO2 25 10/27/2017   GLUCOSE 149 (H) 10/27/2017   BUN 8 10/27/2017   CREATININE 1.11 10/27/2017   CALCIUM 8.8 10/27/2017   PROT 7.6 10/27/2017   ALBUMIN 4.0 10/27/2017   AST 66 (H) 10/27/2017   ALT 81 (H) 10/27/2017   ALKPHOS 118 10/27/2017   BILITOT 0.6 10/27/2017   GFRNONAA >60 10/27/2017   GFRAA >60 10/27/2017    Lab Results  Component Value Date   CEA1 1.52 10/27/2017    Lab Results  Component Value Date   INR 0.98 01/19/2016    Imaging:  Mr Liver W Wo Contrast  Result Date: 12/04/2017 CLINICAL DATA:  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 EXAM: MRI ABDOMEN WITHOUT AND WITH CONTRAST TECHNIQUE: Multiplanar multisequence MR imaging of the abdomen was performed both before and after the administration of intravenous contrast. CONTRAST:  22m MULTIHANCE GADOBENATE DIMEGLUMINE 529 MG/ML  IV SOLN COMPARISON:  04/27/2017 MRI abdomen. FINDINGS: Lower chest: No acute abnormality at the lung bases. Hepatobiliary: Normal liver size and configuration. Severe heterogeneous hepatic steatosis, unchanged. No evidence of discrete liver mass. Normal gallbladder with no cholelithiasis. No biliary ductal dilatation. Common bile duct diameter 3 mm. No choledocholithiasis. Pancreas: No pancreatic mass or duct dilation.  No pancreas divisum. Spleen: Normal size. No mass. Adrenals/Urinary Tract: Right adrenal 1.4 cm adenoma (series 17/image 57) is stable since 12/11/2015 CT. No new adrenal nodules. No hydronephrosis. Normal kidneys with no renal mass. Stomach/Bowel: Normal non-distended stomach. Visualized small and large bowel is normal caliber, with no bowel wall thickening. Vascular/Lymphatic: Normal caliber abdominal aorta. Patent portal, splenic, hepatic and renal veins. No pathologically enlarged lymph nodes in the abdomen. Other: No abdominal ascites or focal fluid collection. Musculoskeletal: No aggressive appearing focal osseous lesions. IMPRESSION: 1. No evidence of metastatic disease in the abdomen. No evidence of recurrent hepatic metastatic disease. 2. Stable right adrenal adenoma. 3. Stable severe heterogeneous hepatic steatosis. Electronically Signed   By: JIlona SorrelM.D.   On: 12/04/2017 13:58    Medications: I have reviewed the patient's current medications.   Assessment/Plan:  1. 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 CTschest, abdomen, and pelvis on 12/11/2015-indeterminate tiny pulmonary nodules, multiple liver metastases, small nodes in the sigmoid mesocolon  3. Laparoscopic sigmoid colectomy,  liver biopsy, Port-A-Cath placement 01/14/2016  4. Pathology sigmoid colon resection-colonic adenocarcinoma, 5.3 cm extending into pericolonic connective tissue, positive lymph-vascular invasion,  positive perineural invasion, negative margins, metastatic carcinoma in9 of 28 lymph nodes  5. Pathology liver biopsy-metastatic colorectal adenocarcinoma  6. MSI stable; mismatch repair protein normal  7. 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 Cycle 1 FOLFOX 02/02/2016  9. Cycle 2 FOLFOX 02/15/2016  10. Cycle 3 FOLFOX 02/29/2016  11. Cycle 4 FOLFOX 03/14/2016  12. Cycle 5 FOLFOX 03/28/2016  13. Cycle 6 FOLFOX 04/11/2016 (oxaliplatin held secondary to thrombocytopenia)  14. 04/21/2016 restaging CTs-difficulty evaluating liver lesions due to hepatic steatosis. Stable right adrenal nodule. No adenopathy or local recurrence near the rectosigmoid anastomotic site.  15. Cycle 7 FOLFOX 04/25/2016  16. MRI liver 05/02/2016-partial improvement in hepatic metastases  17. Cycle 8 FOLFOX 05/10/2016  18. Cycle 9 FOLFOX 05/23/2016 (oxaliplatin held due to thrombocytopenia)  19. Cycle 10 FOLFOX 06/06/2016 (oxaliplatin dose reduced due to thrombocytopenia)  20. Cycle 11 FOLFOX 06/27/2016 (oxaliplatin held due to neuropathy)  21. Cycle 12 FOLFOX 07/11/2016 (oxaliplatin held) 22. Initiation of maintenance Xeloda 7 days on/7 days off 07/27/2016 23. MRI liver 11/18/2016-decrease in hepatic metastatic disease. No new or progressive disease identified within the abdomen. 24. Continuation of Xeloda 7 days on/7 days off 25. MRI liver 04/27/2017-previous liver lesions not identified, no new lesions, no lymphadenopathy 26. Xeloda continued 7 days on/7 days off 27. MRI liver 12/04/2017 - no evidence of metastatic disease, hepatic steatosis  2. Rectal bleeding and constipation secondary to #1  3. History of peripheral vascular disease, status post left lower extremity vascular bypass surgery in April 2017  4. History of nephrolithiasis  5. History of Graves' disease treated with radioactive iodine  6. Anxiety/depression  7. Hypertension  8. Hospitalization 01/19/2016  with wound dehiscence status post secondary suture closure of abdominal wall  9. 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 Hyperglycemia 06/20/2016-diagnosed with diabetes, maintained on insulin    Disposition: Mr. Hoey appears stable.  He is tolerating the Xeloda well.  No clinical evidence of disease progression.  The restaging MRI reveals no evidence of metastatic disease.  We will follow-up on the CEA from today.  He will continue Xeloda on the current schedule.  Mr. Sundt will return for an office and lab visit in 6 weeks.  15 minutes were spent with the patient today.  The majority of the time was used for counseling and coordination of care.  Betsy Coder, MD  12/08/2017  8:39 AM

## 2017-12-28 ENCOUNTER — Other Ambulatory Visit: Payer: Self-pay | Admitting: Oncology

## 2017-12-28 DIAGNOSIS — C187 Malignant neoplasm of sigmoid colon: Secondary | ICD-10-CM

## 2018-01-01 ENCOUNTER — Other Ambulatory Visit: Payer: Self-pay | Admitting: Oncology

## 2018-01-01 ENCOUNTER — Other Ambulatory Visit: Payer: Self-pay | Admitting: Endocrinology

## 2018-01-01 DIAGNOSIS — C187 Malignant neoplasm of sigmoid colon: Secondary | ICD-10-CM

## 2018-01-05 MED FILL — CAPECITABINE 500 MG TABLET: 500 | 28 days supply | Qty: 112 | Fill #0

## 2018-01-05 MED FILL — UNIFINE PENTIPS 31GX3/16": 31G X 5 MM | 50 days supply | Qty: 100 | Fill #0

## 2018-01-05 MED FILL — TAMSULOSIN HCL 0.4 MG CAP: 0.4 | 30 days supply | Qty: 30 | Fill #1

## 2018-01-05 MED FILL — NOVOLOG FLEXPEN SYRINGE: 100 | 30 days supply | Qty: 30 | Fill #3

## 2018-01-05 MED FILL — UNIFINE PENTIPS 31GX3/16: 31G X 5 MM | 50 days supply | Qty: 100 | Fill #0

## 2018-01-09 ENCOUNTER — Other Ambulatory Visit: Payer: Self-pay | Admitting: Cardiovascular Disease

## 2018-01-10 NOTE — Telephone Encounter (Signed)
Refill Request.  

## 2018-01-19 ENCOUNTER — Inpatient Hospital Stay (HOSPITAL_BASED_OUTPATIENT_CLINIC_OR_DEPARTMENT_OTHER): Payer: Medicaid Other | Admitting: Nurse Practitioner

## 2018-01-19 ENCOUNTER — Inpatient Hospital Stay: Payer: Medicaid Other

## 2018-01-19 ENCOUNTER — Encounter: Payer: Self-pay | Admitting: Nurse Practitioner

## 2018-01-19 ENCOUNTER — Inpatient Hospital Stay: Payer: Medicaid Other | Attending: Nurse Practitioner

## 2018-01-19 VITALS — BP 148/90 | HR 94 | Temp 97.7°F | Resp 17 | Ht 70.0 in | Wt 265.9 lb

## 2018-01-19 DIAGNOSIS — E1165 Type 2 diabetes mellitus with hyperglycemia: Secondary | ICD-10-CM | POA: Insufficient documentation

## 2018-01-19 DIAGNOSIS — Z9181 History of falling: Secondary | ICD-10-CM | POA: Insufficient documentation

## 2018-01-19 DIAGNOSIS — I10 Essential (primary) hypertension: Secondary | ICD-10-CM | POA: Insufficient documentation

## 2018-01-19 DIAGNOSIS — Z794 Long term (current) use of insulin: Secondary | ICD-10-CM | POA: Diagnosis not present

## 2018-01-19 DIAGNOSIS — C787 Secondary malignant neoplasm of liver and intrahepatic bile duct: Secondary | ICD-10-CM | POA: Insufficient documentation

## 2018-01-19 DIAGNOSIS — C187 Malignant neoplasm of sigmoid colon: Secondary | ICD-10-CM | POA: Insufficient documentation

## 2018-01-19 DIAGNOSIS — Z95828 Presence of other vascular implants and grafts: Secondary | ICD-10-CM

## 2018-01-19 DIAGNOSIS — C189 Malignant neoplasm of colon, unspecified: Secondary | ICD-10-CM

## 2018-01-19 LAB — CMP (CANCER CENTER ONLY)
ALT: 52 U/L — ABNORMAL HIGH (ref 0–44)
AST: 41 U/L (ref 15–41)
Albumin: 3.9 g/dL (ref 3.5–5.0)
Alkaline Phosphatase: 117 U/L (ref 38–126)
Anion gap: 11 (ref 5–15)
BUN: 10 mg/dL (ref 6–20)
CO2: 25 mmol/L (ref 22–32)
Calcium: 9.1 mg/dL (ref 8.9–10.3)
Chloride: 103 mmol/L (ref 98–111)
Creatinine: 1.18 mg/dL (ref 0.61–1.24)
GFR, Est AFR Am: >60 mL/min (ref >60)
GFR, Estimated: >60 mL/min (ref >60)
Glucose, Bld: 191 mg/dL — ABNORMAL HIGH (ref 70–99)
Potassium: 3.6 mmol/L (ref 3.5–5.1)
Sodium: 139 mmol/L (ref 135–145)
Total Bilirubin: 0.6 mg/dL (ref 0.3–1.2)
Total Protein: 7.7 g/dL (ref 6.5–8.1)

## 2018-01-19 LAB — CBC WITH DIFFERENTIAL (CANCER CENTER ONLY)
Basophils Absolute: 0 10*3/uL (ref 0.0–0.1)
Basophils Relative: 0 %
Eosinophils Absolute: 0.3 10*3/uL (ref 0.0–0.5)
Eosinophils Relative: 4 %
HCT: 42.6 % (ref 38.4–49.9)
Hemoglobin: 14 g/dL (ref 13.0–17.1)
Lymphocytes Relative: 39 %
Lymphs Abs: 2.8 10*3/uL (ref 0.9–3.3)
MCH: 30.3 pg (ref 27.2–33.4)
MCHC: 32.9 g/dL (ref 32.0–36.0)
MCV: 91.9 fL (ref 79.3–98.0)
Monocytes Absolute: 0.7 10*3/uL (ref 0.1–0.9)
Monocytes Relative: 9 %
Neutro Abs: 3.4 10*3/uL (ref 1.5–6.5)
Neutrophils Relative %: 48 %
Platelet Count: 156 10*3/uL (ref 140–400)
RBC: 4.63 MIL/uL (ref 4.20–5.82)
RDW: 18.3 % — ABNORMAL HIGH (ref 11.0–14.6)
WBC Count: 7.1 10*3/uL (ref 4.0–10.3)

## 2018-01-19 LAB — CEA (IN HOUSE-CHCC): CEA (CHCC-In House): 1.45 ng/mL (ref 0.00–5.00)

## 2018-01-19 MED ORDER — HEPARIN SOD (PORK) LOCK FLUSH 100 UNIT/ML IV SOLN
500.0000 [IU] | Freq: Once | INTRAVENOUS | Status: AC | PRN
  Administered 2018-01-19: 500 [IU] via INTRAVENOUS
  Filled 2018-01-19: qty 5

## 2018-01-19 MED ORDER — SODIUM CHLORIDE 0.9% FLUSH
10.0000 mL | INTRAVENOUS | Status: DC | PRN
  Administered 2018-01-19: 10 mL via INTRAVENOUS
  Filled 2018-01-19: qty 10

## 2018-01-19 NOTE — Progress Notes (Signed)
[facility] OFFICE PROGRESS NOTE   Diagnosis: Colon cancer  INTERVAL HISTORY:   Jeff [name] returns as scheduled.  He continues Xeloda.  He feels well.  No nausea or vomiting.  No mouth sores.  No diarrhea.  No hand or foot pain or redness.  He has a good appetite.  He recently traveled to [place].  He fell outside of a store and has noted back pain since.  He plans to follow-up with his PCP.  Objective:  Vital signs in last 24 hours:  Blood pressure (!) 142/101, pulse 94, temperature 97.7 F (36.5 C), temperature source Oral, resp. rate 17, height 5' 10" (1.778 m), weight 265 lb 14.4 oz (120.6 kg), SpO2 99 %.    HEENT: No thrush or ulcers. Resp: Lungs clear bilaterally. Cardio: Regular rate and rhythm. GI: Abdomen soft and nontender.  No hepatomegaly. Vascular: No leg edema.  Skin: Palms without erythema. Port-A-Cath without erythema.   Lab Results:  Lab Results  Component Value Date   WBC 7.1 01/19/2018   HGB 14.0 01/19/2018   HCT 42.6 01/19/2018   MCV 91.9 01/19/2018   PLT 156 01/19/2018   NEUTROABS 3.4 01/19/2018    Imaging:  No results found.  Medications: I have reviewed the patient's current medications.  Assessment/Plan: 1. 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 CTschest, abdomen, and pelvis on 12/11/2015-indeterminate tiny pulmonary nodules, multiple liver metastases, small nodes in the sigmoid mesocolon  3. Laparoscopic sigmoid colectomy, liver biopsy, Port-A-Cath placement 01/14/2016  4. Pathology sigmoid colon resection-colonic adenocarcinoma, 5.3 cm extending into pericolonic connective tissue, positive lymph-vascular invasion, positive perineural invasion, negative margins, metastatic carcinoma in9 of 28 lymph nodes  5. Pathology liver biopsy-metastatic colorectal adenocarcinoma  6. MSI stable; mismatch repair protein normal  7. APC  alteration, K RAS wild-type, no BRAF mutation  8. Cycle 1 FOLFOX 02/02/2016  9. Cycle 2 FOLFOX 02/15/2016  10. Cycle 3 FOLFOX 02/29/2016  11. Cycle 4 FOLFOX 03/14/2016  12. Cycle 5 FOLFOX 03/28/2016  13. Cycle 6 FOLFOX 04/11/2016 (oxaliplatin held secondary to thrombocytopenia)  14. 04/21/2016 restaging CTs-difficulty evaluating liver lesions due to hepatic steatosis. Stable right adrenal nodule. No adenopathy or local recurrence near the rectosigmoid anastomotic site.  15. Cycle 7 FOLFOX 04/25/2016  16. MRI liver 05/02/2016-partial improvement in hepatic metastases  17. Cycle 8 FOLFOX 05/10/2016  18. Cycle 9 FOLFOX 05/23/2016 (oxaliplatin held due to thrombocytopenia)  19. Cycle 10 FOLFOX 06/06/2016 (oxaliplatin dose reduced due to thrombocytopenia)  20. Cycle 11 FOLFOX 06/27/2016 (oxaliplatin held due to neuropathy)  21. Cycle 12 FOLFOX 07/11/2016 (oxaliplatin held) 22. Initiation of maintenance Xeloda 7 days on/7 days off 07/27/2016 23. MRI liver 11/18/2016-decrease in hepatic metastatic disease. No new or progressive disease identified within the abdomen. 24. Continuation of Xeloda 7 days on/7 days off 25. MRI liver 04/27/2017-previous liver lesions not identified, no new lesions, no lymphadenopathy 26. Xeloda continued 7 days on/7 days off 27. MRI liver 12/04/2017 - no evidence of metastatic disease, hepatic steatosis 28. Xeloda continued 7 days on/7 days off  2. Rectal bleeding and constipation secondary to #1  3. History of peripheral vascular disease, status post left lower extremity vascular bypass surgery in April 2017  4. History of nephrolithiasis  5. History of Graves' disease treated with radioactive iodine  6. Anxiety/depression  7. Hypertension  8. Hospitalization 01/19/2016 with wound dehiscence status post secondary suture closure of abdominal wall  9. Thrombocytopeniasecondary to chemotherapy-oxaliplatin  held with cycle 6 and cycle 9  FOLFOX  10. Hyperglycemia 06/20/2016-diagnosed with diabetes, maintained on insulin  Disposition: Jeff [name] appears well.  There is no clinical evidence of disease progression.  He will continue Xeloda as he is currently taking.  He will return for lab, port flush and follow-up in 6 weeks.  He will contact the office in the interim with any problems.      ANP/GNP-BC   01/19/2018  9:23 AM        

## 2018-01-24 ENCOUNTER — Telehealth: Payer: Self-pay | Admitting: Emergency Medicine

## 2018-01-24 IMAGING — DX DG CHEST 1V PORT
1 series · 1 of 1 positions shown · non-contrast
Comparison: CT chest 12/11/2015.  Chest x-ray 07/09/2013.

CLINICAL DATA: Operative Port-A-Cath placement. Current history of
colon cancer.

EXAM:
PORTABLE CHEST 1 VIEW

[chest ap]
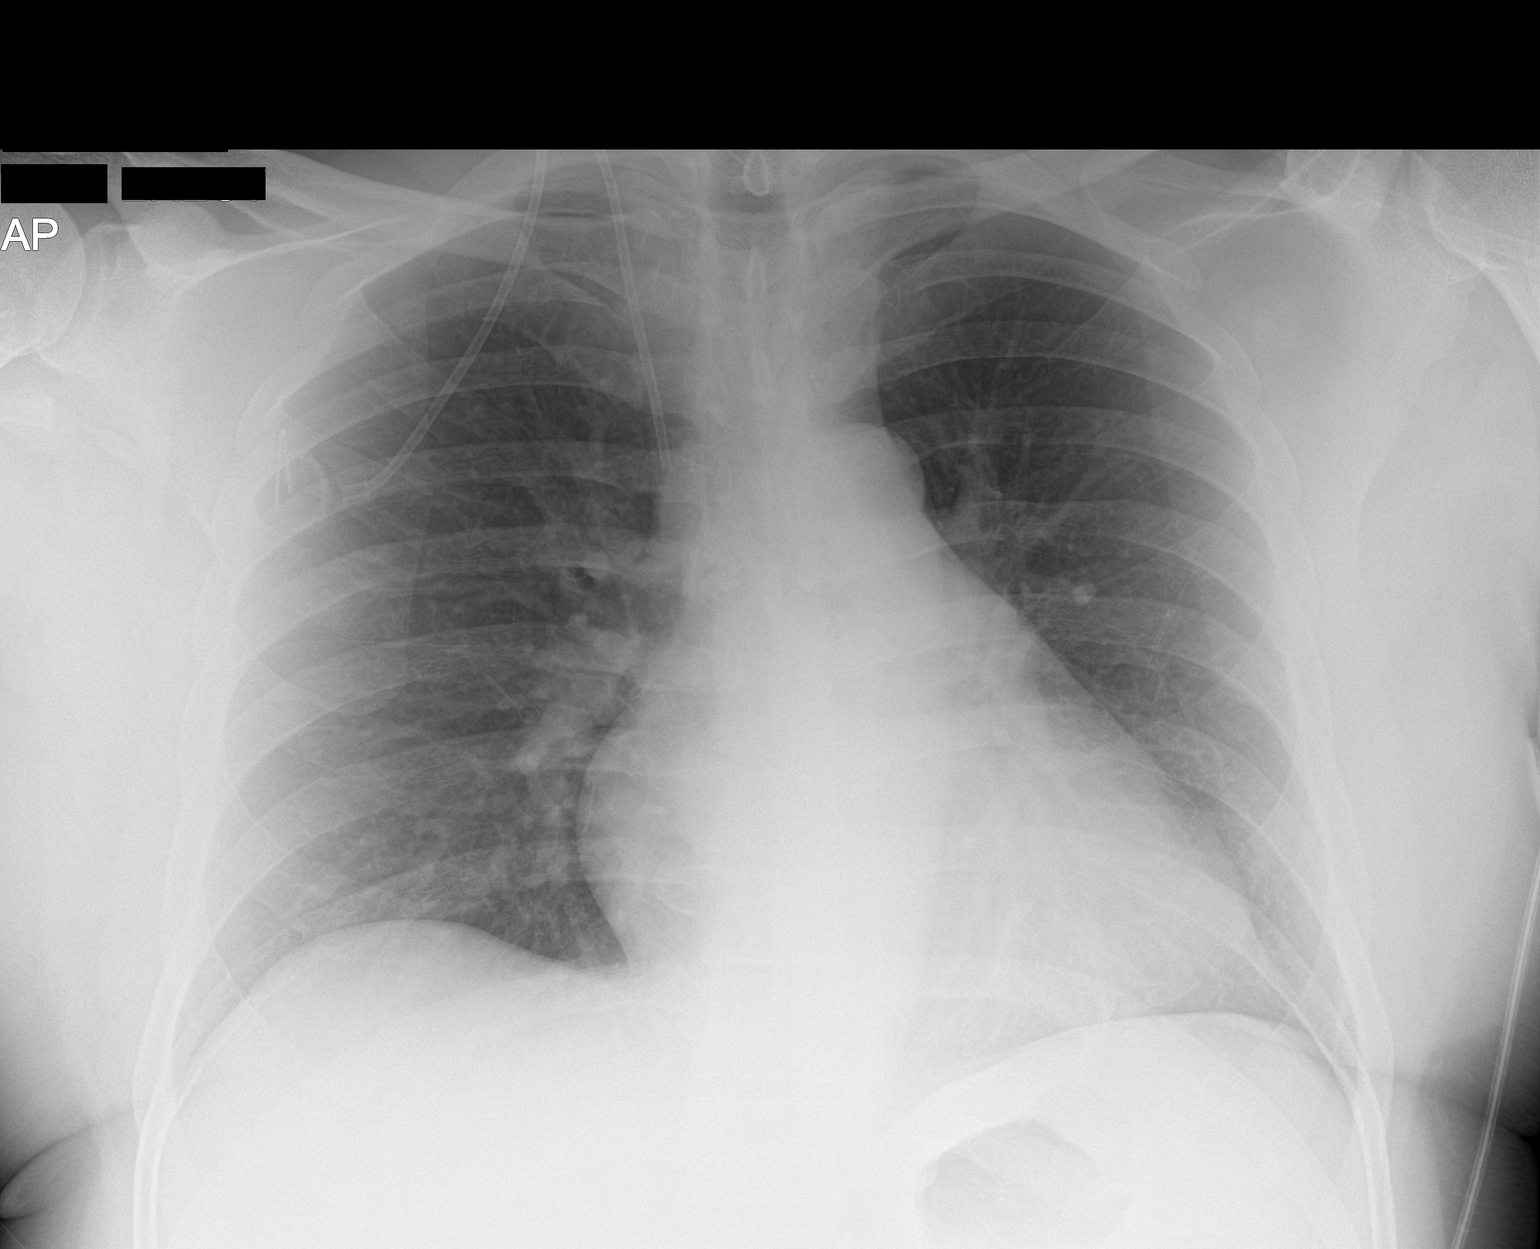

[1 of 1 positions shown; findings below may reference images not displayed]

FINDINGS: Right jugular Port-A-Cath tip projects over the upper SVC. No
evidence of pneumothorax or mediastinal hematoma.

Cardiac silhouette upper normal in size for AP portable technique.
Lungs clear. Bronchovascular markings normal. Pulmonary vascularity
normal.
IMPRESSION: 1. Right jugular Port-A-Cath tip projects over the upper SVC. No
acute complicating features.
2.  No acute cardiopulmonary disease.

## 2018-01-24 NOTE — Telephone Encounter (Addendum)
Tried reaching patient. Phone rings once then disconnects .   ----- Message from Ladell Pier, MD sent at 01/19/2018  1:54 PM EDT ----- Please call patient, CEA is normal

## 2018-02-02 ENCOUNTER — Other Ambulatory Visit: Payer: Self-pay | Admitting: Oncology

## 2018-02-02 DIAGNOSIS — C187 Malignant neoplasm of sigmoid colon: Secondary | ICD-10-CM

## 2018-02-05 ENCOUNTER — Other Ambulatory Visit: Payer: Self-pay | Admitting: Endocrinology

## 2018-02-06 ENCOUNTER — Ambulatory Visit: Payer: Medicaid Other | Admitting: Cardiovascular Disease

## 2018-02-06 ENCOUNTER — Other Ambulatory Visit: Payer: Self-pay | Admitting: Oncology

## 2018-02-06 ENCOUNTER — Encounter: Payer: Self-pay | Admitting: Cardiovascular Disease

## 2018-02-06 VITALS — BP 144/82 | HR 101 | Ht 70.0 in | Wt 268.0 lb

## 2018-02-06 DIAGNOSIS — I10 Essential (primary) hypertension: Secondary | ICD-10-CM | POA: Diagnosis not present

## 2018-02-06 DIAGNOSIS — I739 Peripheral vascular disease, unspecified: Secondary | ICD-10-CM

## 2018-02-06 DIAGNOSIS — C187 Malignant neoplasm of sigmoid colon: Secondary | ICD-10-CM

## 2018-02-06 DIAGNOSIS — E782 Mixed hyperlipidemia: Secondary | ICD-10-CM

## 2018-02-06 DIAGNOSIS — Z23 Encounter for immunization: Secondary | ICD-10-CM

## 2018-02-06 MED ORDER — ATORVASTATIN CALCIUM 80 MG PO TABS: 80.0000 mg | ORAL_TABLET | Freq: Every evening | ORAL | 3 refills | Status: DC

## 2018-02-06 MED FILL — NOVOLOG FLEXPEN SYRINGE: 100 | 30 days supply | Qty: 30 | Fill #4

## 2018-02-06 MED FILL — TAMSULOSIN HCL 0.4 MG CAP: 0.4 | 30 days supply | Qty: 30 | Fill #2

## 2018-02-06 MED FILL — CAPECITABINE 500 MG TABLET: 500 | 28 days supply | Qty: 112 | Fill #0
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
## 2018-02-06 NOTE — Patient Instructions (Signed)
Medication Instructions:  Your physician recommends that you continue on your current medications as directed. Please refer to the Current Medication list given to you today.   Labwork: Your physician recommends that you return for lab work in: FASTING 2 months - LIPID/LIVER   Testing/Procedures: none  Follow-Up: Your physician wants you to follow-up in: 12 months with Dr. Fletcher Anon. You will receive a reminder letter in the mail two months in advance. If you don't receive a letter, please call our office to schedule the follow-up appointment.   Any Other Special Instructions Will Be Listed Below (If Applicable).     If you need a refill on your cardiac medications before your next appointment, please call your pharmacy.

## 2018-02-15 ENCOUNTER — Other Ambulatory Visit: Payer: Self-pay | Admitting: Cardiovascular Disease

## 2018-02-16 NOTE — Telephone Encounter (Signed)
Rx request sent to pharmacy.  

## 2018-02-16 NOTE — Telephone Encounter (Signed)
Please review for refill.  

## 2018-03-01 ENCOUNTER — Other Ambulatory Visit: Payer: Self-pay | Admitting: Oncology

## 2018-03-01 ENCOUNTER — Inpatient Hospital Stay (HOSPITAL_BASED_OUTPATIENT_CLINIC_OR_DEPARTMENT_OTHER): Payer: Medicaid Other | Admitting: Oncology

## 2018-03-01 ENCOUNTER — Inpatient Hospital Stay: Payer: Medicaid Other

## 2018-03-01 ENCOUNTER — Telehealth: Payer: Self-pay | Admitting: Oncology

## 2018-03-01 ENCOUNTER — Inpatient Hospital Stay: Payer: Medicaid Other | Attending: Nurse Practitioner

## 2018-03-01 VITALS — BP 154/93 | HR 97 | Temp 98.3°F | Resp 17 | Ht 70.0 in | Wt 267.6 lb

## 2018-03-01 DIAGNOSIS — C787 Secondary malignant neoplasm of liver and intrahepatic bile duct: Secondary | ICD-10-CM

## 2018-03-01 DIAGNOSIS — C187 Malignant neoplasm of sigmoid colon: Secondary | ICD-10-CM

## 2018-03-01 DIAGNOSIS — E1165 Type 2 diabetes mellitus with hyperglycemia: Secondary | ICD-10-CM | POA: Insufficient documentation

## 2018-03-01 DIAGNOSIS — I10 Essential (primary) hypertension: Secondary | ICD-10-CM

## 2018-03-01 DIAGNOSIS — M545 Low back pain: Secondary | ICD-10-CM

## 2018-03-01 DIAGNOSIS — Z95828 Presence of other vascular implants and grafts: Secondary | ICD-10-CM

## 2018-03-01 DIAGNOSIS — Z794 Long term (current) use of insulin: Secondary | ICD-10-CM | POA: Diagnosis not present

## 2018-03-01 DIAGNOSIS — C189 Malignant neoplasm of colon, unspecified: Secondary | ICD-10-CM

## 2018-03-01 LAB — CMP (CANCER CENTER ONLY)
ALT: 70 U/L — ABNORMAL HIGH (ref 0–44)
AST: 50 U/L — ABNORMAL HIGH (ref 15–41)
Albumin: 3.8 g/dL (ref 3.5–5.0)
Alkaline Phosphatase: 124 U/L (ref 38–126)
Anion gap: 14 (ref 5–15)
BUN: 7 mg/dL (ref 6–20)
CO2: 23 mmol/L (ref 22–32)
Calcium: 9.2 mg/dL (ref 8.9–10.3)
Chloride: 102 mmol/L (ref 98–111)
Creatinine: 1.15 mg/dL (ref 0.61–1.24)
GFR, Est AFR Am: >60 mL/min (ref >60)
GFR, Estimated: >60 mL/min (ref >60)
Glucose, Bld: 184 mg/dL — ABNORMAL HIGH (ref 70–99)
Potassium: 3.8 mmol/L (ref 3.5–5.1)
Sodium: 139 mmol/L (ref 135–145)
Total Bilirubin: 0.6 mg/dL (ref 0.3–1.2)
Total Protein: 7.8 g/dL (ref 6.5–8.1)

## 2018-03-01 LAB — CBC WITH DIFFERENTIAL (CANCER CENTER ONLY)
Abs Immature Granulocytes: 0.03 10*3/uL (ref 0.00–0.07)
Basophils Absolute: 0 10*3/uL (ref 0.0–0.1)
Basophils Relative: 0 %
Eosinophils Absolute: 0.3 10*3/uL (ref 0.0–0.5)
Eosinophils Relative: 4 %
HCT: 43.4 % (ref 39.0–52.0)
Hemoglobin: 14.3 g/dL (ref 13.0–17.0)
Immature Granulocytes: 0 %
Lymphocytes Relative: 40 %
Lymphs Abs: 3.3 10*3/uL (ref 0.7–4.0)
MCH: 30.4 pg (ref 26.0–34.0)
MCHC: 32.9 g/dL (ref 30.0–36.0)
MCV: 92.3 fL (ref 80.0–100.0)
Monocytes Absolute: 0.6 10*3/uL (ref 0.1–1.0)
Monocytes Relative: 8 %
Neutro Abs: 3.9 10*3/uL (ref 1.7–7.7)
Neutrophils Relative %: 48 %
Platelet Count: 160 10*3/uL (ref 150–400)
RBC: 4.7 MIL/uL (ref 4.22–5.81)
RDW: 15.3 % (ref 11.5–15.5)
WBC Count: 8.2 10*3/uL (ref 4.0–10.5)
nRBC: 0 % (ref 0.0–0.2)

## 2018-03-01 LAB — CEA (IN HOUSE-CHCC): CEA (CHCC-In House): 1.87 ng/mL (ref 0.00–5.00)

## 2018-03-01 MED ORDER — HEPARIN SOD (PORK) LOCK FLUSH 100 UNIT/ML IV SOLN
500.0000 [IU] | Freq: Once | INTRAVENOUS | Status: AC | PRN
  Administered 2018-03-01: 500 [IU] via INTRAVENOUS
  Filled 2018-03-01: qty 5

## 2018-03-01 MED ORDER — SODIUM CHLORIDE 0.9% FLUSH
10.0000 mL | INTRAVENOUS | Status: DC | PRN
  Administered 2018-03-01: 10 mL via INTRAVENOUS
  Filled 2018-03-01: qty 10

## 2018-03-01 NOTE — Progress Notes (Signed)
[name] OFFICE PROGRESS NOTE   Diagnosis: Colon cancer  INTERVAL HISTORY:   Jeff [name] returns as scheduled.  He feels well.  He continues Xeloda.  No mouth sores, diarrhea, or hand/foot pain.   He has pain at the right lower back with certain movements.  No consistent pain.  Objective:  Vital signs in last 24 hours:  Blood pressure (!) 154/93, pulse 97, temperature 98.3 F (36.8 C), temperature source Oral, resp. rate 17, height '5\' 10"'$  (1.778 m), weight 267 lb 9.6 oz (121.4 kg), SpO2 99 %.    HEENT: No thrush or ulcers Resp: Lungs clear bilaterally Cardio: Regular rate and rhythm GI: No hepatomegaly, nontender Vascular: No leg edema Musculoskeletal: The area of discomfort is at the right lateral lower back near the iliac crest, no tenderness Skin: Palms without erythema, mild erythema and dry desquamation at the soles  Portacath/PICC-without erythema  Lab Results:  Lab Results  Component Value Date   WBC 8.2 03/01/2018   HGB 14.3 03/01/2018   HCT 43.4 03/01/2018   MCV 92.3 03/01/2018   PLT 160 03/01/2018   NEUTROABS 3.9 03/01/2018    CMP  Lab Results  Component Value Date   NA 139 03/01/2018   K 3.8 03/01/2018   CL 102 03/01/2018   CO2 23 03/01/2018   GLUCOSE 184 (H) 03/01/2018   BUN 7 03/01/2018   CREATININE 1.15 03/01/2018   CALCIUM 9.2 03/01/2018   PROT 7.8 03/01/2018   ALBUMIN 3.8 03/01/2018   AST 50 (H) 03/01/2018   ALT 70 (H) 03/01/2018   ALKPHOS 124 03/01/2018   BILITOT 0.6 03/01/2018   GFRNONAA >60 03/01/2018   GFRAA >60 03/01/2018    Lab Results  Component Value Date   CEA1 1.45 01/19/2018     Medications: I have reviewed the patient's current medications.   Assessment/Plan: 1. 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 CTschest, abdomen, and pelvis on 12/11/2015-indeterminate tiny pulmonary nodules, multiple liver  metastases, small nodes in the sigmoid mesocolon  3. Laparoscopic sigmoid colectomy, liver biopsy, Port-A-Cath placement 01/14/2016  4. Pathology sigmoid colon resection-colonic adenocarcinoma, 5.3 cm extending into pericolonic connective tissue, positive lymph-vascular invasion, positive perineural invasion, negative margins, metastatic carcinoma in9 of 28 lymph nodes  5. Pathology liver biopsy-metastatic colorectal adenocarcinoma  6. MSI stable; mismatch repair protein normal  7. 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 Cycle 1 FOLFOX 02/02/2016  9. Cycle 2 FOLFOX 02/15/2016  10. Cycle 3 FOLFOX 02/29/2016  11. Cycle 4 FOLFOX 03/14/2016  12. Cycle 5 FOLFOX 03/28/2016  13. Cycle 6 FOLFOX 04/11/2016 (oxaliplatin held secondary to thrombocytopenia)  14. 04/21/2016 restaging CTs-difficulty evaluating liver lesions due to hepatic steatosis. Stable right adrenal nodule. No adenopathy or local recurrence near the rectosigmoid anastomotic site.  15. Cycle 7 FOLFOX 04/25/2016  16. MRI liver 05/02/2016-partial improvement in hepatic metastases  17. Cycle 8 FOLFOX 05/10/2016  18. Cycle 9 FOLFOX 05/23/2016 (oxaliplatin held due to thrombocytopenia)  19. Cycle 10 FOLFOX 06/06/2016 (oxaliplatin dose reduced due to thrombocytopenia)  20. Cycle 11 FOLFOX 06/27/2016 (oxaliplatin held due to neuropathy)  21. Cycle 12 FOLFOX 07/11/2016 (oxaliplatin held) 22. Initiation of maintenance Xeloda 7 days on/7 days off 07/27/2016 23. MRI liver 11/18/2016-decrease in hepatic metastatic disease. No new or progressive disease identified within the abdomen. 24. Continuation of Xeloda 7 days on/7 days off 25. MRI liver 04/27/2017-previous liver lesions not identified, no new lesions, no lymphadenopathy 26.  Xeloda continued 7 days on/7 days off 27. MRI liver 12/04/2017 - no evidence of metastatic disease, hepatic steatosis 28. Xeloda continued 7 days on/7 days off  2. Rectal bleeding and constipation  secondary to #1  3. History of peripheral vascular disease, status post left lower extremity vascular bypass surgery in April 2017  4. History of nephrolithiasis  5. History of Graves' disease treated with radioactive iodine  6. Anxiety/depression  7. Hypertension  8. Hospitalization 01/19/2016 with wound dehiscence status post secondary suture closure of abdominal wall  9. 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 Hyperglycemia 06/20/2016-diagnosed with diabetes, maintained on insulin    Disposition: [name] remains in clinical remission from colon cancer.  We will follow-up on the CEA from today.  He will return for an office visit and CEA in 6 weeks.  He will contact us for consistent pain. He will continue Xeloda on the current schedule.   Betsy Coder, MD  03/01/2018  9:13 AM

## 2018-03-01 NOTE — Telephone Encounter (Signed)
Gave pt avs and calendar  °

## 2018-03-05 MED FILL — NOVOLOG FLEXPEN SYRINGE: 100 | 30 days supply | Qty: 30 | Fill #5

## 2018-03-05 MED FILL — UNIFINE PENTIPS 31GX3/16": 31G X 5 MM | 50 days supply | Qty: 100 | Fill #0

## 2018-03-05 MED FILL — CAPECITABINE 500 MG TABLET: 500 | 28 days supply | Qty: 112 | Fill #0

## 2018-03-05 MED FILL — UNIFINE PENTIPS 31GX3/16: 31G X 5 MM | 50 days supply | Qty: 100 | Fill #0

## 2018-03-26 ENCOUNTER — Other Ambulatory Visit: Payer: Self-pay | Admitting: Endocrinology

## 2018-03-26 ENCOUNTER — Other Ambulatory Visit: Payer: Self-pay | Admitting: Oncology

## 2018-03-26 DIAGNOSIS — C187 Malignant neoplasm of sigmoid colon: Secondary | ICD-10-CM

## 2018-03-29 ENCOUNTER — Other Ambulatory Visit: Payer: Self-pay | Admitting: Pharmacist

## 2018-03-29 DIAGNOSIS — C187 Malignant neoplasm of sigmoid colon: Secondary | ICD-10-CM

## 2018-03-29 MED ORDER — CAPECITABINE 500 MG PO TABS: 2000.0000 mg | ORAL_TABLET | Freq: Two times a day (BID) | ORAL | 0 refills | Status: DC

## 2018-03-29 MED FILL — CAPECITABINE 500 MG TABLET: 500 | 28 days supply | Qty: 112 | Fill #0

## 2018-03-29 MED FILL — NOVOLOG FLEXPEN SYRINGE: 100 | 30 days supply | Qty: 30 | Fill #6

## 2018-03-29 MED FILL — TAMSULOSIN HCL 0.4 MG CAP: 0.4 | 30 days supply | Qty: 30 | Fill #3

## 2018-03-29 NOTE — Telephone Encounter (Signed)
Oral Oncology Pharmacist Encounter  Received refill request from the [facility] long outpatient pharmacy for patient Xeloda. Patient was last seen in the office on 03/01/2018, CEA remained stable, patient appears to be tolerating Xeloda at current dose without issue. Discussed with MD, okay to send refill request to the pharmacy.  Prescription for: Xeloda 500 mg tablets, take 4 tablets (2000 mg) by mouth 2 times daily, after a meal, taken on days 1-7 & 15-21 of each 28-day cycle, quantity #112, refills #0 E scribed to the [facility] has been notified that refill prescription requested is on the way.  Johny Drilling, PharmD, BCPS, BCOP  03/29/2018 10:32 AM Oral Oncology Clinic [phone]

## 2018-04-10 ENCOUNTER — Telehealth: Payer: Self-pay | Admitting: Nurse Practitioner

## 2018-04-10 NOTE — Telephone Encounter (Signed)
R/s appt per 11/26 sch message - left message for patient with appt date and time.

## 2018-04-11 ENCOUNTER — Inpatient Hospital Stay: Payer: Medicaid Other

## 2018-04-11 ENCOUNTER — Inpatient Hospital Stay: Payer: Medicaid Other | Admitting: Nurse Practitioner

## 2018-04-20 ENCOUNTER — Telehealth: Payer: Self-pay | Admitting: Nurse Practitioner

## 2018-04-20 NOTE — Telephone Encounter (Signed)
Call patient to let them I rescheduled his appointments for a morning appointment per scheduling voicemail log.  Left a call back number, and printed and mailed calendar.

## 2018-04-24 ENCOUNTER — Other Ambulatory Visit: Payer: Self-pay | Admitting: Oncology

## 2018-04-24 DIAGNOSIS — C187 Malignant neoplasm of sigmoid colon: Secondary | ICD-10-CM

## 2018-04-27 ENCOUNTER — Telehealth: Payer: Self-pay

## 2018-04-27 ENCOUNTER — Encounter: Payer: Self-pay | Admitting: Nurse Practitioner

## 2018-04-27 ENCOUNTER — Other Ambulatory Visit: Payer: Medicaid Other

## 2018-04-27 ENCOUNTER — Inpatient Hospital Stay: Payer: Medicaid Other | Attending: Nurse Practitioner

## 2018-04-27 ENCOUNTER — Inpatient Hospital Stay: Payer: Medicaid Other

## 2018-04-27 ENCOUNTER — Ambulatory Visit: Payer: Medicaid Other | Admitting: Nurse Practitioner

## 2018-04-27 ENCOUNTER — Inpatient Hospital Stay (HOSPITAL_BASED_OUTPATIENT_CLINIC_OR_DEPARTMENT_OTHER): Payer: Medicaid Other | Admitting: Nurse Practitioner

## 2018-04-27 VITALS — BP 132/86 | HR 97 | Temp 98.4°F | Resp 18 | Ht 70.0 in | Wt 263.3 lb

## 2018-04-27 DIAGNOSIS — Z794 Long term (current) use of insulin: Secondary | ICD-10-CM | POA: Diagnosis not present

## 2018-04-27 DIAGNOSIS — E1165 Type 2 diabetes mellitus with hyperglycemia: Secondary | ICD-10-CM | POA: Insufficient documentation

## 2018-04-27 DIAGNOSIS — C187 Malignant neoplasm of sigmoid colon: Secondary | ICD-10-CM | POA: Insufficient documentation

## 2018-04-27 DIAGNOSIS — R109 Unspecified abdominal pain: Secondary | ICD-10-CM

## 2018-04-27 DIAGNOSIS — I10 Essential (primary) hypertension: Secondary | ICD-10-CM | POA: Insufficient documentation

## 2018-04-27 DIAGNOSIS — C787 Secondary malignant neoplasm of liver and intrahepatic bile duct: Secondary | ICD-10-CM | POA: Insufficient documentation

## 2018-04-27 DIAGNOSIS — C189 Malignant neoplasm of colon, unspecified: Secondary | ICD-10-CM

## 2018-04-27 DIAGNOSIS — Z95828 Presence of other vascular implants and grafts: Secondary | ICD-10-CM

## 2018-04-27 LAB — CMP (CANCER CENTER ONLY)
ALT: 31 U/L (ref 0–44)
AST: 21 U/L (ref 15–41)
Albumin: 3.6 g/dL (ref 3.5–5.0)
Alkaline Phosphatase: 140 U/L — ABNORMAL HIGH (ref 38–126)
Anion gap: 11 (ref 5–15)
BUN: 9 mg/dL (ref 6–20)
CO2: 24 mmol/L (ref 22–32)
Calcium: 8.8 mg/dL — ABNORMAL LOW (ref 8.9–10.3)
Chloride: 104 mmol/L (ref 98–111)
Creatinine: 1.24 mg/dL (ref 0.61–1.24)
GFR, Est AFR Am: >60 mL/min (ref >60)
GFR, Estimated: >60 mL/min (ref >60)
Glucose, Bld: 252 mg/dL — ABNORMAL HIGH (ref 70–99)
Potassium: 3.6 mmol/L (ref 3.5–5.1)
Sodium: 139 mmol/L (ref 135–145)
Total Bilirubin: 0.5 mg/dL (ref 0.3–1.2)
Total Protein: 7.2 g/dL (ref 6.5–8.1)

## 2018-04-27 LAB — CEA (IN HOUSE-CHCC): CEA (CHCC-In House): 2.98 ng/mL (ref 0.00–5.00)

## 2018-04-27 LAB — CBC WITH DIFFERENTIAL (CANCER CENTER ONLY)
Abs Immature Granulocytes: 0.05 10*3/uL (ref 0.00–0.07)
Basophils Absolute: 0 10*3/uL (ref 0.0–0.1)
Basophils Relative: 0 %
Eosinophils Absolute: 0.1 10*3/uL (ref 0.0–0.5)
Eosinophils Relative: 1 %
HCT: 42.6 % (ref 39.0–52.0)
Hemoglobin: 13.6 g/dL (ref 13.0–17.0)
Immature Granulocytes: 1 %
Lymphocytes Relative: 36 %
Lymphs Abs: 2.8 10*3/uL (ref 0.7–4.0)
MCH: 29.8 pg (ref 26.0–34.0)
MCHC: 31.9 g/dL (ref 30.0–36.0)
MCV: 93.4 fL (ref 80.0–100.0)
Monocytes Absolute: 0.6 10*3/uL (ref 0.1–1.0)
Monocytes Relative: 8 %
Neutro Abs: 4.3 10*3/uL (ref 1.7–7.7)
Neutrophils Relative %: 54 %
Platelet Count: 152 10*3/uL (ref 150–400)
RBC: 4.56 MIL/uL (ref 4.22–5.81)
RDW: 15.9 % — ABNORMAL HIGH (ref 11.5–15.5)
WBC Count: 7.8 10*3/uL (ref 4.0–10.5)
nRBC: 0 % (ref 0.0–0.2)

## 2018-04-27 MED ORDER — PROMETHAZINE HCL 12.5 MG PO TABS: 12.5000 mg | ORAL_TABLET | Freq: Four times a day (QID) | ORAL | 0 refills | Status: DC | PRN

## 2018-04-27 MED ORDER — HEPARIN SOD (PORK) LOCK FLUSH 100 UNIT/ML IV SOLN
500.0000 [IU] | Freq: Once | INTRAVENOUS | Status: AC | PRN
  Administered 2018-04-27: 500 [IU] via INTRAVENOUS
  Filled 2018-04-27: qty 5

## 2018-04-27 MED ORDER — PROCHLORPERAZINE MALEATE 10 MG PO TABS: ORAL_TABLET | ORAL | 0 refills | Status: DC

## 2018-04-27 MED ORDER — SODIUM CHLORIDE 0.9% FLUSH
10.0000 mL | INTRAVENOUS | Status: DC | PRN
  Administered 2018-04-27: 10 mL via INTRAVENOUS
  Filled 2018-04-27: qty 10

## 2018-04-27 MED FILL — CAPECITABINE 500 MG TABLET: 500 | 28 days supply | Qty: 112 | Fill #0

## 2018-04-27 MED FILL — NOVOLOG FLEXPEN SYRINGE: 100 | 30 days supply | Qty: 30 | Fill #7

## 2018-04-27 MED FILL — TAMSULOSIN HCL 0.4 MG CAP: 0.4 | 30 days supply | Qty: 30 | Fill #4

## 2018-04-27 NOTE — Telephone Encounter (Signed)
Printed avs and calender of upcoming appointment. Per 12/13 los  

## 2018-04-27 NOTE — Progress Notes (Signed)
[place] OFFICE PROGRESS NOTE   Diagnosis: Colon cancer  INTERVAL HISTORY:   Jeff [name] returns as scheduled.  He continues Xeloda.  He feels well.  He denies nausea/vomiting.  No mouth sores.  No diarrhea.  No hand or foot pain or redness.  Appetite overall is good.  He does not eat as much as he used to.  He has occasional right-sided abdominal pain.  Back pain is better.  Objective:  Vital signs in last 24 hours:  Blood pressure 132/86, pulse 97, temperature 98.4 F (36.9 C), temperature source Oral, resp. rate 18, height _0  (1.778 m), weight 263 lb 4.8 oz (119.4 kg), SpO2 100 %.    HEENT: No thrush or ulcers. Resp: Lungs clear bilaterally. Cardio: Regular rate and rhythm. GI: Abdomen soft and nontender.  No hepatomegaly.  No mass. Vascular: No leg edema. Skin: Palms without erythema. Port-A-Cath without erythema.   Lab Results:  Lab Results  Component Value Date   WBC 7.8 04/27/2018   HGB 13.6 04/27/2018   HCT 42.6 04/27/2018   MCV 93.4 04/27/2018   PLT 152 04/27/2018   NEUTROABS 4.3 04/27/2018    Imaging:  No results found.  Medications: I have reviewed the patient's current medications.  Assessment/Plan: 1. 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 CTschest, abdomen, and pelvis on 12/11/2015-indeterminate tiny pulmonary nodules, multiple liver metastases, small nodes in the sigmoid mesocolon  3. Laparoscopic sigmoid colectomy, liver biopsy, Port-A-Cath placement 01/14/2016  4. Pathology sigmoid colon resection-colonic adenocarcinoma, 5.3 cm extending into pericolonic connective tissue, positive lymph-vascular invasion, positive perineural invasion, negative margins, metastatic carcinoma in9 of 28 lymph nodes  5. Pathology liver biopsy-metastatic colorectal adenocarcinoma  6. MSI stable; mismatch repair protein normal  7. APC alteration, K RAS  wild-type, no BRAF mutation  8. Cycle 1 FOLFOX 02/02/2016  9. Cycle 2 FOLFOX 02/15/2016  10. Cycle 3 FOLFOX 02/29/2016  11. Cycle 4 FOLFOX 03/14/2016  12. Cycle 5 FOLFOX 03/28/2016  13. Cycle 6 FOLFOX 04/11/2016 (oxaliplatin held secondary to thrombocytopenia)  14. 04/21/2016 restaging CTs-difficulty evaluating liver lesions due to hepatic steatosis. Stable right adrenal nodule. No adenopathy or local recurrence near the rectosigmoid anastomotic site.  15. Cycle 7 FOLFOX 04/25/2016  16. MRI liver 05/02/2016-partial improvement in hepatic metastases  17. Cycle 8 FOLFOX 05/10/2016  18. Cycle 9 FOLFOX 05/23/2016 (oxaliplatin held due to thrombocytopenia)  19. Cycle 10 FOLFOX 06/06/2016 (oxaliplatin dose reduced due to thrombocytopenia)  20. Cycle 11 FOLFOX 06/27/2016 (oxaliplatin held due to neuropathy)  21. Cycle 12 FOLFOX 07/11/2016 (oxaliplatin held) 22. Initiation of maintenance Xeloda 7 days on/7 days off 07/27/2016 23. MRI liver 11/18/2016-decrease in hepatic metastatic disease. No new or progressive disease identified within the abdomen. 24. Continuation of Xeloda 7 days on/7 days off 25. MRI liver 04/27/2017-previous liver lesions not identified, no new lesions, no lymphadenopathy 26. Xeloda continued 7 days on/7 days off 27. MRI liver 12/04/2017 - no evidence of metastatic disease, hepatic steatosis 28. Xeloda continued 7 days on/7 days off  2. Rectal bleeding and constipation secondary to #1  3. History of peripheral vascular disease, status post left lower extremity vascular bypass surgery in April 2017  4. History of nephrolithiasis  5. History of Graves' disease treated with radioactive iodine  6. Anxiety/depression  7. Hypertension  8. Hospitalization 01/19/2016 with wound dehiscence status post secondary suture closure of abdominal wall  9. Thrombocytopeniasecondary to chemotherapy-oxaliplatin held with cycle 6 and cycle  9 FOLFOX  10.  Hyperglycemia 06/20/2016-diagnosed with diabetes, maintained on insulin     Disposition: Jeff [name] appears well.  There is no clinical evidence of disease progression.  He will continue Xeloda as he is currently taking.  He continues to tolerate Xeloda without significant toxicity.  He will undergo a restaging MRI of the liver prior to his next visit.  He will return for lab, Port-A-Cath flush and a follow-up visit on 06/08/2017.  He will contact the office in the interim with any problems.    Jeff [name] ANP/GNP-BC   04/27/2018  10:47 AM

## 2018-05-16 ENCOUNTER — Other Ambulatory Visit: Payer: Self-pay | Admitting: Cardiovascular Disease

## 2018-05-20 ENCOUNTER — Other Ambulatory Visit: Payer: Self-pay | Admitting: Nurse Practitioner

## 2018-05-20 DIAGNOSIS — C787 Secondary malignant neoplasm of liver and intrahepatic bile duct: Secondary | ICD-10-CM

## 2018-05-20 DIAGNOSIS — C187 Malignant neoplasm of sigmoid colon: Secondary | ICD-10-CM

## 2018-05-20 DIAGNOSIS — C189 Malignant neoplasm of colon, unspecified: Secondary | ICD-10-CM

## 2018-05-21 ENCOUNTER — Other Ambulatory Visit: Payer: Self-pay | Admitting: Oncology

## 2018-05-21 ENCOUNTER — Telehealth: Payer: Self-pay

## 2018-05-21 DIAGNOSIS — C187 Malignant neoplasm of sigmoid colon: Secondary | ICD-10-CM

## 2018-05-21 NOTE — Telephone Encounter (Signed)
Received lab results. Per Dr. Loanne Drilling, pt is over due for an office visit. Called pt and LVM requesting returned call.

## 2018-05-25 MED FILL — NOVOLOG FLEXPEN SYRINGE: 100 | 30 days supply | Qty: 30 | Fill #8

## 2018-05-25 MED FILL — TAMSULOSIN HCL 0.4 MG CAP: 0.4 | 30 days supply | Qty: 30 | Fill #5

## 2018-05-25 MED FILL — CAPECITABINE 500 MG TABLET: 500 | 28 days supply | Qty: 112 | Fill #0

## 2018-05-31 ENCOUNTER — Other Ambulatory Visit: Payer: Medicaid Other

## 2018-06-08 ENCOUNTER — Telehealth: Payer: Self-pay | Admitting: Oncology

## 2018-06-08 ENCOUNTER — Inpatient Hospital Stay: Payer: Medicaid Other | Attending: Nurse Practitioner

## 2018-06-08 ENCOUNTER — Inpatient Hospital Stay (HOSPITAL_BASED_OUTPATIENT_CLINIC_OR_DEPARTMENT_OTHER): Payer: Medicaid Other | Admitting: Oncology

## 2018-06-08 ENCOUNTER — Inpatient Hospital Stay: Payer: Medicaid Other

## 2018-06-08 VITALS — BP 136/90 | HR 93 | Temp 97.6°F | Resp 19 | Ht 70.0 in | Wt 265.5 lb

## 2018-06-08 DIAGNOSIS — Z95828 Presence of other vascular implants and grafts: Secondary | ICD-10-CM

## 2018-06-08 DIAGNOSIS — Z794 Long term (current) use of insulin: Secondary | ICD-10-CM

## 2018-06-08 DIAGNOSIS — E119 Type 2 diabetes mellitus without complications: Secondary | ICD-10-CM | POA: Insufficient documentation

## 2018-06-08 DIAGNOSIS — C787 Secondary malignant neoplasm of liver and intrahepatic bile duct: Secondary | ICD-10-CM | POA: Insufficient documentation

## 2018-06-08 DIAGNOSIS — I10 Essential (primary) hypertension: Secondary | ICD-10-CM | POA: Insufficient documentation

## 2018-06-08 DIAGNOSIS — C187 Malignant neoplasm of sigmoid colon: Secondary | ICD-10-CM | POA: Insufficient documentation

## 2018-06-08 DIAGNOSIS — C189 Malignant neoplasm of colon, unspecified: Secondary | ICD-10-CM

## 2018-06-08 LAB — CBC WITH DIFFERENTIAL (CANCER CENTER ONLY)
Abs Immature Granulocytes: 0.04 10*3/uL (ref 0.00–0.07)
Basophils Absolute: 0 10*3/uL (ref 0.0–0.1)
Basophils Relative: 0 %
Eosinophils Absolute: 0.4 10*3/uL (ref 0.0–0.5)
Eosinophils Relative: 4 %
HCT: 44.7 % (ref 39.0–52.0)
Hemoglobin: 14.6 g/dL (ref 13.0–17.0)
Immature Granulocytes: 1 %
Lymphocytes Relative: 42 %
Lymphs Abs: 3.7 10*3/uL (ref 0.7–4.0)
MCH: 30.5 pg (ref 26.0–34.0)
MCHC: 32.7 g/dL (ref 30.0–36.0)
MCV: 93.5 fL (ref 80.0–100.0)
Monocytes Absolute: 0.7 10*3/uL (ref 0.1–1.0)
Monocytes Relative: 8 %
Neutro Abs: 4 10*3/uL (ref 1.7–7.7)
Neutrophils Relative %: 45 %
Platelet Count: 162 10*3/uL (ref 150–400)
RBC: 4.78 MIL/uL (ref 4.22–5.81)
RDW: 16 % — ABNORMAL HIGH (ref 11.5–15.5)
WBC Count: 8.9 10*3/uL (ref 4.0–10.5)
nRBC: 0 % (ref 0.0–0.2)

## 2018-06-08 LAB — CMP (CANCER CENTER ONLY)
ALT: 55 U/L — ABNORMAL HIGH (ref 0–44)
AST: 42 U/L — ABNORMAL HIGH (ref 15–41)
Albumin: 3.9 g/dL (ref 3.5–5.0)
Alkaline Phosphatase: 122 U/L (ref 38–126)
Anion gap: 13 (ref 5–15)
BUN: 8 mg/dL (ref 6–20)
CO2: 24 mmol/L (ref 22–32)
Calcium: 9 mg/dL (ref 8.9–10.3)
Chloride: 103 mmol/L (ref 98–111)
Creatinine: 1.11 mg/dL (ref 0.61–1.24)
GFR, Est AFR Am: >60 mL/min (ref >60)
GFR, Estimated: >60 mL/min (ref >60)
Glucose, Bld: 197 mg/dL — ABNORMAL HIGH (ref 70–99)
Potassium: 3.8 mmol/L (ref 3.5–5.1)
Sodium: 140 mmol/L (ref 135–145)
Total Bilirubin: 0.6 mg/dL (ref 0.3–1.2)
Total Protein: 7.7 g/dL (ref 6.5–8.1)

## 2018-06-08 LAB — CEA (IN HOUSE-CHCC): CEA (CHCC-In House): 4.84 ng/mL (ref 0.00–5.00)

## 2018-06-08 MED ORDER — SODIUM CHLORIDE 0.9% FLUSH
10.0000 mL | INTRAVENOUS | Status: DC | PRN
  Administered 2018-06-08: 10 mL via INTRAVENOUS
  Filled 2018-06-08: qty 10

## 2018-06-08 MED ORDER — HEPARIN SOD (PORK) LOCK FLUSH 100 UNIT/ML IV SOLN
500.0000 [IU] | Freq: Once | INTRAVENOUS | Status: AC | PRN
  Administered 2018-06-08: 500 [IU] via INTRAVENOUS
  Filled 2018-06-08: qty 5

## 2018-06-08 NOTE — Telephone Encounter (Signed)
Gave AVS and calendar °

## 2018-06-08 NOTE — Progress Notes (Signed)
[place] OFFICE PROGRESS NOTE   Diagnosis: Colon cancer  INTERVAL HISTORY:   Jeff [name] returns as scheduled.  He continues Xeloda.  No mouth sores, diarrhea, or hand/foot pain.  He feels well.  He has nausea while taking Xeloda.  This is relieved with Compazine and Phenergan.  Objective:  Vital signs in last 24 hours:  Blood pressure 136/90, pulse 93, temperature 97.6 F (36.4 C), temperature source Oral, resp. rate 19, height _0  (1.778 m), weight 265 lb 8 oz (120.4 kg), SpO2 100 %.    HEENT: No thrush or ulcers Resp: Lungs clear bilaterally Cardio: Regular rate and rhythm GI: No hepatomegaly, nontender Vascular: No leg edema  Skin: Mild hyperpigmentation of the hands  Portacath/PICC-without erythema  Lab Results:  Lab Results  Component Value Date   WBC 8.9 06/08/2018   HGB 14.6 06/08/2018   HCT 44.7 06/08/2018   MCV 93.5 06/08/2018   PLT 162 06/08/2018   NEUTROABS 4.0 06/08/2018    CMP  Lab Results  Component Value Date   NA 140 06/08/2018   K 3.8 06/08/2018   CL 103 06/08/2018   CO2 24 06/08/2018   GLUCOSE 197 (H) 06/08/2018   BUN 8 06/08/2018   CREATININE 1.11 06/08/2018   CALCIUM 9.0 06/08/2018   PROT 7.7 06/08/2018   ALBUMIN 3.9 06/08/2018   AST 42 (H) 06/08/2018   ALT 55 (H) 06/08/2018   ALKPHOS 122 06/08/2018   BILITOT 0.6 06/08/2018   GFRNONAA >60 06/08/2018   GFRAA >60 06/08/2018    Lab Results  Component Value Date   CEA1 2.98 04/27/2018    Medications: I have reviewed the patient's current medications.   Assessment/Plan: 1. 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 CTschest, abdomen, and pelvis on 12/11/2015-indeterminate tiny pulmonary nodules, multiple liver metastases, small nodes in the sigmoid mesocolon  3. Laparoscopic sigmoid colectomy, liver biopsy, Port-A-Cath placement 01/14/2016  4. Pathology sigmoid colon  resection-colonic adenocarcinoma, 5.3 cm extending into pericolonic connective tissue, positive lymph-vascular invasion, positive perineural invasion, negative margins, metastatic carcinoma in9 of 28 lymph nodes  5. Pathology liver biopsy-metastatic colorectal adenocarcinoma  6. MSI stable; mismatch repair protein normal  7. 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 Cycle 1 FOLFOX 02/02/2016  9. Cycle 2 FOLFOX 02/15/2016  10. Cycle 3 FOLFOX 02/29/2016  11. Cycle 4 FOLFOX 03/14/2016  12. Cycle 5 FOLFOX 03/28/2016  13. Cycle 6 FOLFOX 04/11/2016 (oxaliplatin held secondary to thrombocytopenia)  14. 04/21/2016 restaging CTs-difficulty evaluating liver lesions due to hepatic steatosis. Stable right adrenal nodule. No adenopathy or local recurrence near the rectosigmoid anastomotic site.  15. Cycle 7 FOLFOX 04/25/2016  16. MRI liver 05/02/2016-partial improvement in hepatic metastases  17. Cycle 8 FOLFOX 05/10/2016  18. Cycle 9 FOLFOX 05/23/2016 (oxaliplatin held due to thrombocytopenia)  19. Cycle 10 FOLFOX 06/06/2016 (oxaliplatin dose reduced due to thrombocytopenia)  20. Cycle 11 FOLFOX 06/27/2016 (oxaliplatin held due to neuropathy)  21. Cycle 12 FOLFOX 07/11/2016 (oxaliplatin held) 22. Initiation of maintenance Xeloda 7 days on/7 days off 07/27/2016 23. MRI liver 11/18/2016-decrease in hepatic metastatic disease. No new or progressive disease identified within the abdomen. 24. Continuation of Xeloda 7 days on/7 days off 25. MRI liver 04/27/2017-previous liver lesions not identified, no new lesions, no lymphadenopathy 26. Xeloda continued 7 days on/7 days off 27. MRI liver 12/04/2017 - no evidence of metastatic disease, hepatic steatosis 28. Xeloda continued 7 days on/7 days off  2. Rectal bleeding and constipation secondary to #1  3. History of peripheral vascular disease, status post left lower extremity vascular bypass surgery in April 2017  4. History of  nephrolithiasis  5. History of Graves' disease treated with radioactive iodine  6. Anxiety/depression  7. Hypertension  8. Hospitalization 01/19/2016 with wound dehiscence status post secondary suture closure of abdominal wall  9. 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 Hyperglycemia 06/20/2016-diagnosed with diabetes, maintained on insulin      Disposition: Jeff [name] appears stable.  He is tolerating the Xeloda well.  He will continue Xeloda on the current schedule.  A restaging MRI was ordered last month.  This has not been scheduled.  We will request the MRI be scheduled prior to a return visit in 6 weeks.  We will follow-up on the CEA from today.  Betsy Coder, MD  06/08/2018  10:26 AM

## 2018-06-10 ENCOUNTER — Other Ambulatory Visit: Payer: Self-pay | Admitting: Oncology

## 2018-06-10 DIAGNOSIS — C787 Secondary malignant neoplasm of liver and intrahepatic bile duct: Secondary | ICD-10-CM

## 2018-06-10 DIAGNOSIS — C189 Malignant neoplasm of colon, unspecified: Secondary | ICD-10-CM

## 2018-06-10 DIAGNOSIS — C187 Malignant neoplasm of sigmoid colon: Secondary | ICD-10-CM

## 2018-06-11 ENCOUNTER — Telehealth: Payer: Self-pay | Admitting: *Deleted

## 2018-06-11 NOTE — Telephone Encounter (Signed)
Given to [place] to [facility] refill

## 2018-06-11 NOTE — Telephone Encounter (Signed)
Second call to above Pt , spoke with wife who stated he still has medication left. No new prescription needed at this time

## 2018-06-11 NOTE — Telephone Encounter (Signed)
Per Lattie Haw call placed to Pt. To see if he took 90 pills in 20 days. Message left, awaiting return call from Pt.

## 2018-06-11 NOTE — Telephone Encounter (Signed)
MRI liver has been authorized per Darlen[place] in managed care. Scheduled at [address] location per patient request for 06/21/18 at 5:00/5:20. Notified wife of appointment. This will not work for her schedule. Provided her the phone # to call [facility] to reschedule to a date/time of their choice.

## 2018-06-15 ENCOUNTER — Other Ambulatory Visit: Payer: Self-pay | Admitting: Oncology

## 2018-06-15 DIAGNOSIS — C187 Malignant neoplasm of sigmoid colon: Secondary | ICD-10-CM

## 2018-06-21 ENCOUNTER — Other Ambulatory Visit: Payer: Medicaid Other

## 2018-06-22 ENCOUNTER — Other Ambulatory Visit: Payer: Medicaid Other

## 2018-06-22 MED FILL — NOVOLOG FLEXPEN SYRINGE: 100 | 33 days supply | Qty: 30 | Fill #0

## 2018-06-22 MED FILL — CAPECITABINE 500 MG TABLET: 500 | 28 days supply | Qty: 112 | Fill #0

## 2018-06-22 MED FILL — UNIFINE PENTIPS 31GX3/16": 31G X 5 MM | 50 days supply | Qty: 100 | Fill #0

## 2018-06-22 MED FILL — UNIFINE PENTIPS 31GX3/16: 31G X 5 MM | 50 days supply | Qty: 100 | Fill #0

## 2018-06-22 MED FILL — TAMSULOSIN HCL 0.4 MG CAP: 0.4 | 30 days supply | Qty: 30 | Fill #6

## 2018-07-03 ENCOUNTER — Inpatient Hospital Stay: Admission: RE | Admit: 2018-07-03 | Payer: Medicaid Other | Source: Ambulatory Visit

## 2018-07-13 ENCOUNTER — Other Ambulatory Visit: Payer: Self-pay | Admitting: Oncology

## 2018-07-13 DIAGNOSIS — C187 Malignant neoplasm of sigmoid colon: Secondary | ICD-10-CM

## 2018-07-13 NOTE — Telephone Encounter (Signed)
MRI on 3/1--decide afterwards

## 2018-07-15 ENCOUNTER — Ambulatory Visit
Admission: RE | Admit: 2018-07-15 | Discharge: 2018-07-15 | Disposition: A | Discharge status: Home / Self Care | Payer: Medicaid Other | Source: Ambulatory Visit | Attending: Nurse Practitioner | Admitting: Nurse Practitioner

## 2018-07-15 DIAGNOSIS — C189 Malignant neoplasm of colon, unspecified: Secondary | ICD-10-CM

## 2018-07-15 DIAGNOSIS — C187 Malignant neoplasm of sigmoid colon: Secondary | ICD-10-CM

## 2018-07-15 DIAGNOSIS — C787 Secondary malignant neoplasm of liver and intrahepatic bile duct: Secondary | ICD-10-CM

## 2018-07-15 MED ORDER — GADOBENATE DIMEGLUMINE 529 MG/ML IV SOLN
20.0000 mL | Freq: Once | INTRAVENOUS | Status: AC | PRN
  Administered 2018-07-15: 20 mL via INTRAVENOUS

## 2018-07-17 ENCOUNTER — Telehealth: Payer: Self-pay | Admitting: *Deleted

## 2018-07-17 NOTE — Telephone Encounter (Signed)
Notified wife via VM after she had returned my earlier call. MRI liver shows no cancer, just fatty liver. Follow up as scheduled.

## 2018-07-17 NOTE — Telephone Encounter (Signed)
-----   Message from Ladell Pier, MD sent at 07/16/2018  5:29 PM EST ----- Please call patient, MRI shows persistent fatty liver, no evidence of cancer, follow-up as scheduled

## 2018-07-18 MED FILL — CAPECITABINE 500 MG TABLET: 500 | 28 days supply | Qty: 112 | Fill #0

## 2018-07-20 ENCOUNTER — Inpatient Hospital Stay (HOSPITAL_BASED_OUTPATIENT_CLINIC_OR_DEPARTMENT_OTHER): Payer: Medicaid Other | Admitting: Nurse Practitioner

## 2018-07-20 ENCOUNTER — Inpatient Hospital Stay: Payer: Medicaid Other | Attending: Nurse Practitioner

## 2018-07-20 ENCOUNTER — Telehealth: Payer: Self-pay | Admitting: Nurse Practitioner

## 2018-07-20 ENCOUNTER — Inpatient Hospital Stay: Payer: Medicaid Other

## 2018-07-20 ENCOUNTER — Encounter: Payer: Self-pay | Admitting: Nurse Practitioner

## 2018-07-20 VITALS — BP 134/95 | HR 102 | Temp 98.2°F | Resp 19 | Ht 70.0 in | Wt 265.0 lb

## 2018-07-20 DIAGNOSIS — C787 Secondary malignant neoplasm of liver and intrahepatic bile duct: Secondary | ICD-10-CM | POA: Insufficient documentation

## 2018-07-20 DIAGNOSIS — I10 Essential (primary) hypertension: Secondary | ICD-10-CM | POA: Diagnosis not present

## 2018-07-20 DIAGNOSIS — E119 Type 2 diabetes mellitus without complications: Secondary | ICD-10-CM

## 2018-07-20 DIAGNOSIS — C187 Malignant neoplasm of sigmoid colon: Secondary | ICD-10-CM | POA: Diagnosis present

## 2018-07-20 DIAGNOSIS — C189 Malignant neoplasm of colon, unspecified: Secondary | ICD-10-CM

## 2018-07-20 DIAGNOSIS — Z794 Long term (current) use of insulin: Secondary | ICD-10-CM | POA: Diagnosis not present

## 2018-07-20 DIAGNOSIS — Z95828 Presence of other vascular implants and grafts: Secondary | ICD-10-CM

## 2018-07-20 LAB — CBC WITH DIFFERENTIAL (CANCER CENTER ONLY)
Abs Immature Granulocytes: 0.02 10*3/uL (ref 0.00–0.07)
Basophils Absolute: 0 10*3/uL (ref 0.0–0.1)
Basophils Relative: 0 %
Eosinophils Absolute: 0.3 10*3/uL (ref 0.0–0.5)
Eosinophils Relative: 5 %
HCT: 44.2 % (ref 39.0–52.0)
Hemoglobin: 14.4 g/dL (ref 13.0–17.0)
Immature Granulocytes: 0 %
Lymphocytes Relative: 42 %
Lymphs Abs: 3.1 10*3/uL (ref 0.7–4.0)
MCH: 30.2 pg (ref 26.0–34.0)
MCHC: 32.6 g/dL (ref 30.0–36.0)
MCV: 92.7 fL (ref 80.0–100.0)
Monocytes Absolute: 0.6 10*3/uL (ref 0.1–1.0)
Monocytes Relative: 8 %
Neutro Abs: 3.3 10*3/uL (ref 1.7–7.7)
Neutrophils Relative %: 45 %
Platelet Count: 152 10*3/uL (ref 150–400)
RBC: 4.77 MIL/uL (ref 4.22–5.81)
RDW: 15.2 % (ref 11.5–15.5)
WBC Count: 7.3 10*3/uL (ref 4.0–10.5)
nRBC: 0 % (ref 0.0–0.2)

## 2018-07-20 LAB — COMPREHENSIVE METABOLIC PANEL
ALT: 66 U/L — ABNORMAL HIGH (ref 0–44)
AST: 53 U/L — ABNORMAL HIGH (ref 15–41)
Albumin: 4.1 g/dL (ref 3.5–5.0)
Alkaline Phosphatase: 119 U/L (ref 38–126)
Anion gap: 10 (ref 5–15)
BUN: 9 mg/dL (ref 6–20)
CO2: 26 mmol/L (ref 22–32)
Calcium: 8.8 mg/dL — ABNORMAL LOW (ref 8.9–10.3)
Chloride: 102 mmol/L (ref 98–111)
Creatinine, Ser: 1.13 mg/dL (ref 0.61–1.24)
GFR calc Af Amer: >60 mL/min (ref >60)
GFR calc non Af Amer: >60 mL/min (ref >60)
Glucose, Bld: 248 mg/dL — ABNORMAL HIGH (ref 70–99)
Potassium: 3.6 mmol/L (ref 3.5–5.1)
Sodium: 138 mmol/L (ref 135–145)
Total Bilirubin: 0.4 mg/dL (ref 0.3–1.2)
Total Protein: 7.4 g/dL (ref 6.5–8.1)

## 2018-07-20 LAB — CEA (IN HOUSE-CHCC): CEA (CHCC-In House): 4.71 ng/mL (ref 0.00–5.00)

## 2018-07-20 MED ORDER — SODIUM CHLORIDE 0.9% FLUSH
10.0000 mL | INTRAVENOUS | Status: DC | PRN
  Administered 2018-07-20: 10 mL via INTRAVENOUS
  Filled 2018-07-20: qty 10

## 2018-07-20 MED ORDER — HEPARIN SOD (PORK) LOCK FLUSH 100 UNIT/ML IV SOLN
500.0000 [IU] | Freq: Once | INTRAVENOUS | Status: AC | PRN
  Administered 2018-07-20: 500 [IU] via INTRAVENOUS
  Filled 2018-07-20: qty 5

## 2018-07-20 NOTE — Telephone Encounter (Signed)
Scheduled appt per 3/6 los.  Patient declined calendar and avs.

## 2018-07-20 NOTE — Progress Notes (Signed)
[place] OFFICE PROGRESS NOTE   Diagnosis: Colon cancer  INTERVAL HISTORY:   Jeff [name] returns as scheduled.  He continues Xeloda.  He overall feels well.  No nausea or vomiting.  No mouth sores.  No diarrhea.  No hand or foot pain or redness.  Objective:  Vital signs in last 24 hours:  Blood pressure (!) 134/95, pulse (!) 102, temperature 98.2 F (36.8 C), temperature source Oral, resp. rate 19, height _0  (1.778 m), weight 265 lb (120.2 kg), SpO2 99 %.    HEENT: No thrush or ulcers. Lymphatics: No palpable cervical, supraclavicular or axillary lymph nodes. Resp: Lungs clear bilaterally. Cardio: Regular rate and rhythm. GI: Abdomen soft and nontender.  No hepatomegaly. Vascular: No leg edema. Skin: Palms with mild hyperpigmentation, no erythema. Port-A-Cath without erythema.  Lab Results:  Lab Results  Component Value Date   WBC 7.3 07/20/2018   HGB 14.4 07/20/2018   HCT 44.2 07/20/2018   MCV 92.7 07/20/2018   PLT 152 07/20/2018   NEUTROABS 3.3 07/20/2018    Imaging:  No results found.  Medications: I have reviewed the patient's current medications.  Assessment/Plan: 1. 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 CTschest, abdomen, and pelvis on 12/11/2015-indeterminate tiny pulmonary nodules, multiple liver metastases, small nodes in the sigmoid mesocolon  3. Laparoscopic sigmoid colectomy, liver biopsy, Port-A-Cath placement 01/14/2016  4. Pathology sigmoid colon resection-colonic adenocarcinoma, 5.3 cm extending into pericolonic connective tissue, positive lymph-vascular invasion, positive perineural invasion, negative margins, metastatic carcinoma in9 of 28 lymph nodes  5. Pathology liver biopsy-metastatic colorectal adenocarcinoma  6. MSI stable; mismatch repair protein normal  7. 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 Cycle 1  FOLFOX 02/02/2016  9. Cycle 2 FOLFOX 02/15/2016  10. Cycle 3 FOLFOX 02/29/2016  11. Cycle 4 FOLFOX 03/14/2016  12. Cycle 5 FOLFOX 03/28/2016  13. Cycle 6 FOLFOX 04/11/2016 (oxaliplatin held secondary to thrombocytopenia)  14. 04/21/2016 restaging CTs-difficulty evaluating liver lesions due to hepatic steatosis. Stable right adrenal nodule. No adenopathy or local recurrence near the rectosigmoid anastomotic site.  15. Cycle 7 FOLFOX 04/25/2016  16. MRI liver 05/02/2016-partial improvement in hepatic metastases  17. Cycle 8 FOLFOX 05/10/2016  18. Cycle 9 FOLFOX 05/23/2016 (oxaliplatin held due to thrombocytopenia)  19. Cycle 10 FOLFOX 06/06/2016 (oxaliplatin dose reduced due to thrombocytopenia)  20. Cycle 11 FOLFOX 06/27/2016 (oxaliplatin held due to neuropathy)  21. Cycle 12 FOLFOX 07/11/2016 (oxaliplatin held) 22. Initiation of maintenance Xeloda 7 days on/7 days off 07/27/2016 23. MRI liver 11/18/2016-decrease in hepatic metastatic disease. No new or progressive disease identified within the abdomen. 24. Continuation of Xeloda 7 days on/7 days off 25. MRI liver 04/27/2017-previous liver lesions not identified, no new lesions, no lymphadenopathy 26. Xeloda continued 7 days on/7 days off 27. MRI liver 12/04/2017 - no evidence of metastatic disease, hepatic steatosis 28. Xeloda continued 7 days on/7 days off 29. MRI liver 07/15/2018- no evidence of metastatic disease.  Stable severe hepatic steatosis. 30. Xeloda continue 7 days on/7 days off  2. Rectal bleeding and constipation secondary to #1  3. History of peripheral vascular disease, status post left lower extremity vascular bypass surgery in April 2017  4. History of nephrolithiasis  5. History of Graves' disease treated with radioactive iodine  6. Anxiety/depression  7. Hypertension  8. Hospitalization 01/19/2016 with wound dehiscence status post secondary suture closure of abdominal wall  9.  Thrombocytopeniasecondary to chemotherapy-oxaliplatin held  with cycle 6 and cycle 9 FOLFOX  10. Hyperglycemia 06/20/2016-diagnosed with diabetes, maintained on insulin   Disposition: Jeff [name] appears stable.  The recent restaging MRI of the liver shows no evidence of metastatic disease.  He will continue Xeloda.  He will return for lab, follow-up and a Port-A-Cath flush in 6 weeks.  Plan reviewed with Dr. Benay Spice.  Ned Card ANP/GNP-BC   07/20/2018  9:23 AM

## 2018-08-03 ENCOUNTER — Other Ambulatory Visit: Payer: Self-pay | Admitting: Cardiovascular Disease

## 2018-08-03 DIAGNOSIS — I10 Essential (primary) hypertension: Secondary | ICD-10-CM

## 2018-08-06 NOTE — Telephone Encounter (Signed)
Refill Request.  

## 2018-08-09 ENCOUNTER — Other Ambulatory Visit: Payer: Self-pay | Admitting: Oncology

## 2018-08-09 DIAGNOSIS — C187 Malignant neoplasm of sigmoid colon: Secondary | ICD-10-CM

## 2018-08-11 ENCOUNTER — Other Ambulatory Visit: Payer: Self-pay | Admitting: Oncology

## 2018-08-11 DIAGNOSIS — C187 Malignant neoplasm of sigmoid colon: Secondary | ICD-10-CM

## 2018-08-16 MED FILL — TAMSULOSIN HCL 0.4 MG CAP: 0.4 | 30 days supply | Qty: 30 | Fill #7

## 2018-08-16 MED FILL — CAPECITABINE 500 MG TABS: 500 | 28 days supply | Qty: 112 | Fill #0

## 2018-08-16 MED FILL — NOVOLOG FLEXPEN SYRINGE: 100 | 33 days supply | Qty: 30 | Fill #1

## 2018-08-28 ENCOUNTER — Other Ambulatory Visit: Payer: Medicaid Other

## 2018-08-28 ENCOUNTER — Encounter: Payer: Self-pay | Admitting: *Deleted

## 2018-08-28 ENCOUNTER — Telehealth: Payer: Self-pay | Admitting: *Deleted

## 2018-08-28 ENCOUNTER — Ambulatory Visit: Payer: Medicaid Other | Admitting: Oncology

## 2018-08-28 NOTE — Telephone Encounter (Signed)
Called and left the patient a message regarding his appts for May. Explained that the appts will be in my chart, and that we are mailing a calendar out to him.

## 2018-08-28 NOTE — Telephone Encounter (Signed)
Left VM for patient that appointments on 08/31/18 are being canceled and will be rescheduled for 1 month. His port flush is in date and can be done in 1 month with the reschedule.

## 2018-08-31 ENCOUNTER — Ambulatory Visit: Payer: Medicaid Other | Admitting: Oncology

## 2018-08-31 ENCOUNTER — Other Ambulatory Visit: Payer: Medicaid Other

## 2018-09-10 ENCOUNTER — Other Ambulatory Visit: Payer: Self-pay | Admitting: Oncology

## 2018-09-10 DIAGNOSIS — C187 Malignant neoplasm of sigmoid colon: Secondary | ICD-10-CM

## 2018-09-13 MED FILL — UNIFINE PENTIPS 31GX3/16": 31G X 5 MM | 50 days supply | Qty: 100 | Fill #1

## 2018-09-13 MED FILL — CAPECITABINE 500 MG TABS: 500 | 28 days supply | Qty: 112 | Fill #0

## 2018-09-13 MED FILL — UNIFINE PENTIPS 31GX3/16: 31G X 5 MM | 50 days supply | Qty: 100 | Fill #1

## 2018-09-13 MED FILL — NOVOLOG FLEXPEN SYRINGE: 100 | 33 days supply | Qty: 30 | Fill #2

## 2018-09-13 MED FILL — TAMSULOSIN HCL 0.4 MG CAP: 0.4 | 30 days supply | Qty: 30 | Fill #8

## 2018-09-19 ENCOUNTER — Other Ambulatory Visit: Payer: Self-pay | Admitting: Oncology

## 2018-09-19 DIAGNOSIS — C787 Secondary malignant neoplasm of liver and intrahepatic bile duct: Secondary | ICD-10-CM

## 2018-09-19 DIAGNOSIS — C187 Malignant neoplasm of sigmoid colon: Secondary | ICD-10-CM

## 2018-09-19 DIAGNOSIS — C189 Malignant neoplasm of colon, unspecified: Secondary | ICD-10-CM

## 2018-09-22 ENCOUNTER — Other Ambulatory Visit: Payer: Self-pay | Admitting: Endocrinology

## 2018-09-22 NOTE — Telephone Encounter (Signed)
Please refill x 1 F/u is due  

## 2018-09-24 ENCOUNTER — Other Ambulatory Visit: Payer: Self-pay | Admitting: Endocrinology

## 2018-09-28 ENCOUNTER — Encounter: Payer: Self-pay | Admitting: Nurse Practitioner

## 2018-09-28 ENCOUNTER — Telehealth: Payer: Self-pay

## 2018-09-28 ENCOUNTER — Inpatient Hospital Stay: Payer: Medicaid Other

## 2018-09-28 ENCOUNTER — Inpatient Hospital Stay: Payer: Medicaid Other | Attending: Nurse Practitioner | Admitting: Nurse Practitioner

## 2018-09-28 ENCOUNTER — Other Ambulatory Visit: Payer: Self-pay

## 2018-09-28 ENCOUNTER — Telehealth: Payer: Self-pay | Admitting: Nurse Practitioner

## 2018-09-28 VITALS — BP 140/99 | HR 118 | Temp 97.9°F | Resp 20 | Ht 70.0 in | Wt 262.7 lb

## 2018-09-28 DIAGNOSIS — C189 Malignant neoplasm of colon, unspecified: Secondary | ICD-10-CM

## 2018-09-28 DIAGNOSIS — Z95828 Presence of other vascular implants and grafts: Secondary | ICD-10-CM

## 2018-09-28 DIAGNOSIS — F419 Anxiety disorder, unspecified: Secondary | ICD-10-CM

## 2018-09-28 DIAGNOSIS — Z794 Long term (current) use of insulin: Secondary | ICD-10-CM | POA: Diagnosis not present

## 2018-09-28 DIAGNOSIS — C787 Secondary malignant neoplasm of liver and intrahepatic bile duct: Secondary | ICD-10-CM

## 2018-09-28 DIAGNOSIS — C187 Malignant neoplasm of sigmoid colon: Secondary | ICD-10-CM | POA: Diagnosis not present

## 2018-09-28 DIAGNOSIS — E119 Type 2 diabetes mellitus without complications: Secondary | ICD-10-CM

## 2018-09-28 DIAGNOSIS — I10 Essential (primary) hypertension: Secondary | ICD-10-CM | POA: Insufficient documentation

## 2018-09-28 LAB — CMP (CANCER CENTER ONLY)
ALT: 80 U/L — ABNORMAL HIGH (ref 0–44)
AST: 64 U/L — ABNORMAL HIGH (ref 15–41)
Albumin: 4 g/dL (ref 3.5–5.0)
Alkaline Phosphatase: 115 U/L (ref 38–126)
Anion gap: 13 (ref 5–15)
BUN: 9 mg/dL (ref 6–20)
CO2: 21 mmol/L — ABNORMAL LOW (ref 22–32)
Calcium: 8.7 mg/dL — ABNORMAL LOW (ref 8.9–10.3)
Chloride: 103 mmol/L (ref 98–111)
Creatinine: 1.18 mg/dL (ref 0.61–1.24)
GFR, Est AFR Am: >60 mL/min (ref >60)
GFR, Estimated: >60 mL/min (ref >60)
Glucose, Bld: 231 mg/dL — ABNORMAL HIGH (ref 70–99)
Potassium: 3.7 mmol/L (ref 3.5–5.1)
Sodium: 137 mmol/L (ref 135–145)
Total Bilirubin: 0.6 mg/dL (ref 0.3–1.2)
Total Protein: 7.7 g/dL (ref 6.5–8.1)

## 2018-09-28 LAB — CBC WITH DIFFERENTIAL (CANCER CENTER ONLY)
Abs Immature Granulocytes: 0.03 10*3/uL (ref 0.00–0.07)
Basophils Absolute: 0 10*3/uL (ref 0.0–0.1)
Basophils Relative: 0 %
Eosinophils Absolute: 0.5 10*3/uL (ref 0.0–0.5)
Eosinophils Relative: 5 %
HCT: 44.8 % (ref 39.0–52.0)
Hemoglobin: 14.6 g/dL (ref 13.0–17.0)
Immature Granulocytes: 0 %
Lymphocytes Relative: 42 %
Lymphs Abs: 4 10*3/uL (ref 0.7–4.0)
MCH: 30.1 pg (ref 26.0–34.0)
MCHC: 32.6 g/dL (ref 30.0–36.0)
MCV: 92.4 fL (ref 80.0–100.0)
Monocytes Absolute: 0.7 10*3/uL (ref 0.1–1.0)
Monocytes Relative: 7 %
Neutro Abs: 4.4 10*3/uL (ref 1.7–7.7)
Neutrophils Relative %: 46 %
Platelet Count: 170 10*3/uL (ref 150–400)
RBC: 4.85 MIL/uL (ref 4.22–5.81)
RDW: 15.3 % (ref 11.5–15.5)
WBC Count: 9.7 10*3/uL (ref 4.0–10.5)
nRBC: 0 % (ref 0.0–0.2)

## 2018-09-28 LAB — CEA (IN HOUSE-CHCC): CEA (CHCC-In House): 7.56 ng/mL — ABNORMAL HIGH (ref 0.00–5.00)

## 2018-09-28 MED ORDER — HEPARIN SOD (PORK) LOCK FLUSH 100 UNIT/ML IV SOLN
500.0000 [IU] | Freq: Once | INTRAVENOUS | Status: AC
  Administered 2018-09-28: 12:00:00 500 [IU] via INTRAVENOUS
  Filled 2018-09-28: qty 5

## 2018-09-28 MED ORDER — SODIUM CHLORIDE 0.9% FLUSH
10.0000 mL | INTRAVENOUS | Status: DC | PRN
  Administered 2018-09-28: 12:00:00 10 mL via INTRAVENOUS
  Filled 2018-09-28: qty 10

## 2018-09-28 NOTE — Telephone Encounter (Signed)
Scheduled appt per 5/15 los. ° °A calendar will be mailed out. °

## 2018-09-28 NOTE — Progress Notes (Signed)
[name]   Diagnosis: Colon cancer  INTERVAL HISTORY:   Jeff [name] returns as scheduled.  He continues Xeloda.  He overall feels well.  He denies nausea/vomiting.  No mouth sores.  No diarrhea.  No hand or foot pain or redness.  He denies pain.  He has a good appetite.  He feels anxious because his wife could not accompany him inside due to COVID-19 visitor restrictions.  Objective:  Vital signs in last 24 hours:  Blood pressure (!) 140/99, pulse (!) 118, temperature 97.9 F (36.6 C), temperature source Oral, resp. rate 20, height 5' 10" (1.778 m), weight 262 lb 11.2 oz (119.2 kg), SpO2 100 %.    HEENT: No thrush or ulcers. Vascular: No leg edema. Skin: Palms without erythema.  No skin breakdown. Port-A-Cath without erythema.   Lab Results:  Lab Results  Component Value Date   WBC 9.7 09/28/2018   HGB 14.6 09/28/2018   HCT 44.8 09/28/2018   MCV 92.4 09/28/2018   PLT 170 09/28/2018   NEUTROABS 4.4 09/28/2018    Imaging:  No results found.  Medications: I have reviewed the patient's current medications.  Assessment/Plan: 1. 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 CTschest, abdomen, and pelvis on 12/11/2015-indeterminate tiny pulmonary nodules, multiple liver metastases, small nodes in the sigmoid mesocolon  3. Laparoscopic sigmoid colectomy, liver biopsy, Port-A-Cath placement 01/14/2016  4. Pathology sigmoid colon resection-colonic adenocarcinoma, 5.3 cm extending into pericolonic connective tissue, positive lymph-vascular invasion, positive perineural invasion, negative margins, metastatic carcinoma in9 of 28 lymph nodes  5. Pathology liver biopsy-metastatic colorectal adenocarcinoma  6. MSI stable; mismatch repair protein normal  7. 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 Cycle 1 FOLFOX 02/02/2016  9. Cycle 2 FOLFOX  02/15/2016  10. Cycle 3 FOLFOX 02/29/2016  11. Cycle 4 FOLFOX 03/14/2016  12. Cycle 5 FOLFOX 03/28/2016  13. Cycle 6 FOLFOX 04/11/2016 (oxaliplatin held secondary to thrombocytopenia)  14. 04/21/2016 restaging CTs-difficulty evaluating liver lesions due to hepatic steatosis. Stable right adrenal nodule. No adenopathy or local recurrence near the rectosigmoid anastomotic site.  15. Cycle 7 FOLFOX 04/25/2016  16. MRI liver 05/02/2016-partial improvement in hepatic metastases  17. Cycle 8 FOLFOX 05/10/2016  18. Cycle 9 FOLFOX 05/23/2016 (oxaliplatin held due to thrombocytopenia)  19. Cycle 10 FOLFOX 06/06/2016 (oxaliplatin dose reduced due to thrombocytopenia)  20. Cycle 11 FOLFOX 06/27/2016 (oxaliplatin held due to neuropathy)  21. Cycle 12 FOLFOX 07/11/2016 (oxaliplatin held) 22. Initiation of maintenance Xeloda 7 days on/7 days off 07/27/2016 23. MRI liver 11/18/2016-decrease in hepatic metastatic disease. No new or progressive disease identified within the abdomen. 24. Continuation of Xeloda 7 days on/7 days off 25. MRI liver 04/27/2017-previous liver lesions not identified, no new lesions, no lymphadenopathy 26. Xeloda continued 7 days on/7 days off 27. MRI liver 12/04/2017 - no evidence of metastatic disease, hepatic steatosis 28. Xeloda continued 7 days on/7 days off 29. MRI liver 07/15/2018- no evidence of metastatic disease.  Stable severe hepatic steatosis. 30. Xeloda continue 7 days on/7 days off  2. Rectal bleeding and constipation secondary to #1  3. History of peripheral vascular disease, status post left lower extremity vascular bypass surgery in April 2017  4. History of nephrolithiasis  5. History of Graves' disease treated with radioactive iodine  6. Anxiety/depression  7. Hypertension  8. Hospitalization 01/19/2016 with wound dehiscence status post secondary suture closure of abdominal wall  9. Thrombocytopeniasecondary  to  chemotherapy-oxaliplatin held with cycle 6 and cycle 9 FOLFOX  10. Hyperglycemia 06/20/2016-diagnosed with diabetes, maintained on insulin   Disposition: Jeff [name] appears stable.  There is no clinical evidence of disease progression.  He will continue Xeloda as he is currently taking.  We will follow-up on the CEA from today.  Port-A-Cath will be flushed today.  He will return for lab, Port-A-Cath flush and follow-up in 8 weeks.  He will contact the office in the interim with any problems.  The elevated blood pressure and heart rate are likely due to anxiety.  We will contact him at home and ask him to repeat these.   Ned Card ANP/GNP-BC   09/28/2018  12:41 PM

## 2018-09-28 NOTE — Telephone Encounter (Signed)
Left VM msg , per Ned Card NP. Pt wife is a Marine scientist and please have her recheck pt vitals at home. Left this information on pt vm.

## 2018-10-03 ENCOUNTER — Other Ambulatory Visit: Payer: Self-pay | Admitting: Oncology

## 2018-10-03 DIAGNOSIS — C187 Malignant neoplasm of sigmoid colon: Secondary | ICD-10-CM

## 2018-10-10 MED FILL — NOVOLOG FLEXPEN SYRINGE: 100 | 33 days supply | Qty: 30 | Fill #3

## 2018-10-10 MED FILL — CAPECITABINE 500 MG TABLET: 500 | 28 days supply | Qty: 112 | Fill #0

## 2018-10-19 ENCOUNTER — Other Ambulatory Visit: Payer: Self-pay | Admitting: Endocrinology

## 2018-10-19 NOTE — Telephone Encounter (Signed)
Please refill x 1 F/u is due  

## 2018-10-22 ENCOUNTER — Other Ambulatory Visit: Payer: Self-pay | Admitting: Endocrinology

## 2018-11-05 ENCOUNTER — Other Ambulatory Visit: Payer: Self-pay | Admitting: Nurse Practitioner

## 2018-11-05 DIAGNOSIS — C187 Malignant neoplasm of sigmoid colon: Secondary | ICD-10-CM

## 2018-11-08 MED FILL — CAPECITABINE 500 MG TABS: 500 | 28 days supply | Qty: 112 | Fill #0

## 2018-11-08 MED FILL — TAMSULOSIN HCL 0.4 MG CAP: 0.4 | 30 days supply | Qty: 30 | Fill #0

## 2018-11-08 MED FILL — UNIFINE PENTIPS 31GX3/16": 31G X 5 MM | 50 days supply | Qty: 100 | Fill #2

## 2018-11-08 MED FILL — UNIFINE PENTIPS 31GX3/16: 31G X 5 MM | 50 days supply | Qty: 100 | Fill #2

## 2018-11-08 MED FILL — NOVOLOG FLEXPEN SYRINGE: 100 | 33 days supply | Qty: 30 | Fill #0

## 2018-11-23 ENCOUNTER — Inpatient Hospital Stay: Payer: Medicaid Other | Attending: Nurse Practitioner

## 2018-11-23 ENCOUNTER — Other Ambulatory Visit: Payer: Self-pay | Admitting: Oncology

## 2018-11-23 ENCOUNTER — Other Ambulatory Visit: Payer: Self-pay

## 2018-11-23 ENCOUNTER — Inpatient Hospital Stay (HOSPITAL_BASED_OUTPATIENT_CLINIC_OR_DEPARTMENT_OTHER): Payer: Medicaid Other | Admitting: Oncology

## 2018-11-23 ENCOUNTER — Inpatient Hospital Stay: Payer: Medicaid Other

## 2018-11-23 VITALS — BP 119/90 | HR 109 | Temp 98.5°F | Resp 18 | Ht 70.0 in | Wt 256.2 lb

## 2018-11-23 DIAGNOSIS — C189 Malignant neoplasm of colon, unspecified: Secondary | ICD-10-CM

## 2018-11-23 DIAGNOSIS — I10 Essential (primary) hypertension: Secondary | ICD-10-CM | POA: Diagnosis not present

## 2018-11-23 DIAGNOSIS — C187 Malignant neoplasm of sigmoid colon: Secondary | ICD-10-CM | POA: Insufficient documentation

## 2018-11-23 DIAGNOSIS — F419 Anxiety disorder, unspecified: Secondary | ICD-10-CM

## 2018-11-23 DIAGNOSIS — C787 Secondary malignant neoplasm of liver and intrahepatic bile duct: Secondary | ICD-10-CM

## 2018-11-23 DIAGNOSIS — E119 Type 2 diabetes mellitus without complications: Secondary | ICD-10-CM | POA: Diagnosis not present

## 2018-11-23 DIAGNOSIS — R000 Tachycardia, unspecified: Secondary | ICD-10-CM | POA: Diagnosis not present

## 2018-11-23 DIAGNOSIS — Z794 Long term (current) use of insulin: Secondary | ICD-10-CM | POA: Insufficient documentation

## 2018-11-23 DIAGNOSIS — Z95828 Presence of other vascular implants and grafts: Secondary | ICD-10-CM

## 2018-11-23 LAB — CBC WITH DIFFERENTIAL (CANCER CENTER ONLY)
Abs Immature Granulocytes: 0.03 10*3/uL (ref 0.00–0.07)
Basophils Absolute: 0 10*3/uL (ref 0.0–0.1)
Basophils Relative: 0 %
Eosinophils Absolute: 0.2 10*3/uL (ref 0.0–0.5)
Eosinophils Relative: 3 %
HCT: 44.9 % (ref 39.0–52.0)
Hemoglobin: 14.5 g/dL (ref 13.0–17.0)
Immature Granulocytes: 0 %
Lymphocytes Relative: 41 %
Lymphs Abs: 3.5 10*3/uL (ref 0.7–4.0)
MCH: 30.1 pg (ref 26.0–34.0)
MCHC: 32.3 g/dL (ref 30.0–36.0)
MCV: 93.2 fL (ref 80.0–100.0)
Monocytes Absolute: 0.6 10*3/uL (ref 0.1–1.0)
Monocytes Relative: 7 %
Neutro Abs: 4.2 10*3/uL (ref 1.7–7.7)
Neutrophils Relative %: 49 %
Platelet Count: 175 10*3/uL (ref 150–400)
RBC: 4.82 MIL/uL (ref 4.22–5.81)
RDW: 15.9 % — ABNORMAL HIGH (ref 11.5–15.5)
WBC Count: 8.6 10*3/uL (ref 4.0–10.5)
nRBC: 0 % (ref 0.0–0.2)

## 2018-11-23 LAB — CMP (CANCER CENTER ONLY)
ALT: 66 U/L — ABNORMAL HIGH (ref 0–44)
AST: 52 U/L — ABNORMAL HIGH (ref 15–41)
Albumin: 3.9 g/dL (ref 3.5–5.0)
Alkaline Phosphatase: 98 U/L (ref 38–126)
Anion gap: 12 (ref 5–15)
BUN: 10 mg/dL (ref 6–20)
CO2: 24 mmol/L (ref 22–32)
Calcium: 8.6 mg/dL — ABNORMAL LOW (ref 8.9–10.3)
Chloride: 104 mmol/L (ref 98–111)
Creatinine: 1.22 mg/dL (ref 0.61–1.24)
GFR, Est AFR Am: >60 mL/min (ref >60)
GFR, Estimated: >60 mL/min (ref >60)
Glucose, Bld: 157 mg/dL — ABNORMAL HIGH (ref 70–99)
Potassium: 3.6 mmol/L (ref 3.5–5.1)
Sodium: 140 mmol/L (ref 135–145)
Total Bilirubin: 0.5 mg/dL (ref 0.3–1.2)
Total Protein: 7.8 g/dL (ref 6.5–8.1)

## 2018-11-23 LAB — CEA (IN HOUSE-CHCC): CEA (CHCC-In House): 3.81 ng/mL (ref 0.00–5.00)

## 2018-11-23 MED ORDER — HEPARIN SOD (PORK) LOCK FLUSH 100 UNIT/ML IV SOLN
500.0000 [IU] | Freq: Once | INTRAVENOUS | Status: AC | PRN
  Administered 2018-11-23: 500 [IU] via INTRAVENOUS
  Filled 2018-11-23: qty 5

## 2018-11-23 MED ORDER — SODIUM CHLORIDE 0.9% FLUSH
10.0000 mL | INTRAVENOUS | Status: DC | PRN
  Administered 2018-11-23: 10 mL via INTRAVENOUS
  Filled 2018-11-23: qty 10

## 2018-11-23 NOTE — Progress Notes (Signed)
[place] OFFICE PROGRESS NOTE   Diagnosis: Colon cancer  INTERVAL HISTORY:   [name] returns as scheduled.  He continues Xeloda.  He is due to start another week of Xeloda on 11/26/2018.  No mouth sores or diarrhea.  He has hyperpigmentation of the hands and feet, no pain.  He reports dyspnea when he is hot in the shower.  No chest pain or cough.  He relates weight loss to decreasing sugar in his diet.  Objective:  Vital signs in last 24 hours:  Blood pressure 119/90, pulse (!) 109, temperature 98.5 F (36.9 C), temperature source Oral, resp. rate 18, height 5' 10" (1.778 m), weight 256 lb 3.2 oz (116.2 kg), SpO2 100 %.    Limited physical examination secondary to distancing with the [place] pandemic Cardio: Regular rate and rhythm, tachycardia GI: No hepatomegaly, no mass, nontender Vascular: No leg edema  Skin: Mild hyperpigmentation of the hands  Portacath/PICC-without erythema  Lab Results:  Lab Results  Component Value Date   WBC 8.6 11/23/2018   HGB 14.5 11/23/2018   HCT 44.9 11/23/2018   MCV 93.2 11/23/2018   PLT 175 11/23/2018   NEUTROABS 4.2 11/23/2018    CMP  Lab Results  Component Value Date   NA 140 11/23/2018   K 3.6 11/23/2018   CL 104 11/23/2018   CO2 24 11/23/2018   GLUCOSE 157 (H) 11/23/2018   BUN 10 11/23/2018   CREATININE 1.22 11/23/2018   CALCIUM 8.6 (L) 11/23/2018   PROT 7.8 11/23/2018   ALBUMIN 3.9 11/23/2018   AST 52 (H) 11/23/2018   ALT 66 (H) 11/23/2018   ALKPHOS 98 11/23/2018   BILITOT 0.5 11/23/2018   GFRNONAA >60 11/23/2018   GFRAA >60 11/23/2018    Lab Results  Component Value Date   CEA1 7.56 (H) 09/28/2018    Lab Results  Component Value Date   INR 0.98 01/19/2016     Medications: I have reviewed the patient's current medications.   Assessment/Plan: 1. 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  CTschest, abdomen, and pelvis on 12/11/2015-indeterminate tiny pulmonary nodules, multiple liver metastases, small nodes in the sigmoid mesocolon  3. Laparoscopic sigmoid colectomy, liver biopsy, Port-A-Cath placement 01/14/2016  4. Pathology sigmoid colon resection-colonic adenocarcinoma, 5.3 cm extending into pericolonic connective tissue, positive lymph-vascular invasion, positive perineural invasion, negative margins, metastatic carcinoma in9 of 28 lymph nodes  5. Pathology liver biopsy-metastatic colorectal adenocarcinoma  6. MSI stable; mismatch repair protein normal  7. 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 Cycle 1 FOLFOX 02/02/2016  9. Cycle 2 FOLFOX 02/15/2016  10. Cycle 3 FOLFOX 02/29/2016  11. Cycle 4 FOLFOX 03/14/2016  12. Cycle 5 FOLFOX 03/28/2016  13. Cycle 6 FOLFOX 04/11/2016 (oxaliplatin held secondary to thrombocytopenia)  14. 04/21/2016 restaging CTs-difficulty evaluating liver lesions due to hepatic steatosis. Stable right adrenal nodule. No adenopathy or local recurrence near the rectosigmoid anastomotic site.  15. Cycle 7 FOLFOX 04/25/2016  16. MRI liver 05/02/2016-partial improvement in hepatic metastases  17. Cycle 8 FOLFOX 05/10/2016  18. Cycle 9 FOLFOX 05/23/2016 (oxaliplatin held due to thrombocytopenia)  19. Cycle 10 FOLFOX 06/06/2016 (oxaliplatin dose reduced due to thrombocytopenia)  20. Cycle 11 FOLFOX 06/27/2016 (oxaliplatin held due to neuropathy)  21. Cycle 12 FOLFOX 07/11/2016 (oxaliplatin held) 22. Initiation of maintenance Xeloda 7 days on/7 days off 07/27/2016 23. MRI liver 11/18/2016-decrease in hepatic metastatic disease. No new or progressive disease identified within  the abdomen. 24. Continuation of Xeloda 7 days on/7 days off 25. MRI liver 04/27/2017-previous liver lesions not identified, no new lesions, no lymphadenopathy 26. Xeloda continued 7 days on/7 days off 27. MRI liver 12/04/2017 - no evidence of metastatic disease, hepatic  steatosis 28. Xeloda continued 7 days on/7 days off 29. MRI liver 07/15/2018- no evidence of metastatic disease.  Stable severe hepatic steatosis. 30. Xeloda continue 7 days on/7 days off  2. Rectal bleeding and constipation secondary to #1  3. History of peripheral vascular disease, status post left lower extremity vascular bypass surgery in April 2017  4. History of nephrolithiasis  5. History of Graves' disease treated with radioactive iodine  6. Anxiety/depression  7. Hypertension  8. Hospitalization 01/19/2016 with wound dehiscence status post secondary suture closure of abdominal wall  9. 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 Hyperglycemia 06/20/2016-diagnosed with diabetes, maintained on insulin    Disposition: [name] appears unchanged.  The CEA was mildly elevated at the last office visit.  We will follow-up on the CEA from today.  If the CEA is elevated he will be referred for a restaging MRI of the liver and CT of the chest.  He will return for an office and lab visit in 2 weeks.  He relates tachycardia to anxiety from being at the cancer center.  Tachycardia has been present on multiple previous office visits here.  I have a low clinical suspicion for a primary cardiopulmonary process.  Betsy Coder, MD  11/23/2018  11:47 AM

## 2018-11-24 ENCOUNTER — Other Ambulatory Visit: Payer: Self-pay | Admitting: Oncology

## 2018-11-24 DIAGNOSIS — C189 Malignant neoplasm of colon, unspecified: Secondary | ICD-10-CM

## 2018-11-24 DIAGNOSIS — C787 Secondary malignant neoplasm of liver and intrahepatic bile duct: Secondary | ICD-10-CM

## 2018-11-24 DIAGNOSIS — C187 Malignant neoplasm of sigmoid colon: Secondary | ICD-10-CM

## 2018-11-26 ENCOUNTER — Telehealth: Payer: Self-pay | Admitting: Oncology

## 2018-11-26 NOTE — Telephone Encounter (Signed)
Called and left msg about upcoming appt

## 2018-11-27 ENCOUNTER — Telehealth: Payer: Self-pay | Admitting: *Deleted

## 2018-11-27 NOTE — Telephone Encounter (Signed)
-----   Message from Ladell Pier, MD sent at 11/23/2018  4:06 PM EDT ----- I let him know the CEA was normal today I canceled the order for the chest CT and liver MRI  Please move the 12/07/2018 office and port/lab visit out until January 03, 2019 or January 04, 2019 with Benay Spice or [place]

## 2018-11-27 NOTE — Telephone Encounter (Signed)
Notified wife of CEA results and that MRI/CT not needed at this time. Will move his f/u appointments from 7/24 to 8/20 or 8/20. Scheduling message sent.

## 2018-11-28 ENCOUNTER — Telehealth: Payer: Self-pay | Admitting: Oncology

## 2018-11-28 ENCOUNTER — Telehealth: Payer: Self-pay | Admitting: *Deleted

## 2018-11-28 NOTE — Telephone Encounter (Signed)
A message was left, re: follow up visit. 

## 2018-11-28 NOTE — Telephone Encounter (Signed)
R/s appt per 4/14 sch message - unable to reach pt . Left message with appt date and time   

## 2018-12-07 ENCOUNTER — Ambulatory Visit: Payer: Medicaid Other | Admitting: Nurse Practitioner

## 2018-12-07 ENCOUNTER — Other Ambulatory Visit: Payer: Medicaid Other

## 2018-12-07 ENCOUNTER — Other Ambulatory Visit: Payer: Self-pay | Admitting: Oncology

## 2018-12-07 DIAGNOSIS — C187 Malignant neoplasm of sigmoid colon: Secondary | ICD-10-CM

## 2018-12-10 MED FILL — TAMSULOSIN HCL 0.4 MG CAP: 0.4 | 30 days supply | Qty: 30 | Fill #1

## 2018-12-10 MED FILL — CAPECITABINE 500 MG TABS: 500 | 28 days supply | Qty: 112 | Fill #0

## 2018-12-10 MED FILL — NOVOLOG FLEXPEN SYRINGE: 100 | 33 days supply | Qty: 30 | Fill #1

## 2018-12-13 ENCOUNTER — Encounter (HOSPITAL_COMMUNITY): Payer: Self-pay

## 2018-12-13 ENCOUNTER — Other Ambulatory Visit: Payer: Self-pay

## 2018-12-13 ENCOUNTER — Emergency Department (HOSPITAL_COMMUNITY)
Admission: EM | Admit: 2018-12-13 | Discharge: 2018-12-13 | Disposition: A | Discharge status: Home / Self Care | Payer: Medicaid Other | Attending: Emergency Medicine | Admitting: Emergency Medicine

## 2018-12-13 DIAGNOSIS — E039 Hypothyroidism, unspecified: Secondary | ICD-10-CM | POA: Insufficient documentation

## 2018-12-13 DIAGNOSIS — R630 Anorexia: Secondary | ICD-10-CM | POA: Diagnosis present

## 2018-12-13 DIAGNOSIS — E860 Dehydration: Secondary | ICD-10-CM

## 2018-12-13 DIAGNOSIS — B370 Candidal stomatitis: Secondary | ICD-10-CM

## 2018-12-13 DIAGNOSIS — J45909 Unspecified asthma, uncomplicated: Secondary | ICD-10-CM | POA: Diagnosis not present

## 2018-12-13 DIAGNOSIS — Z8616 Personal history of COVID-19: Secondary | ICD-10-CM

## 2018-12-13 DIAGNOSIS — U071 COVID-19: Secondary | ICD-10-CM | POA: Diagnosis not present

## 2018-12-13 DIAGNOSIS — I252 Old myocardial infarction: Secondary | ICD-10-CM | POA: Insufficient documentation

## 2018-12-13 DIAGNOSIS — I10 Essential (primary) hypertension: Secondary | ICD-10-CM | POA: Diagnosis not present

## 2018-12-13 HISTORY — DX: Personal history of COVID-19: Z86.16

## 2018-12-13 LAB — COMPREHENSIVE METABOLIC PANEL
ALT: 45 U/L — ABNORMAL HIGH (ref 0–44)
AST: 56 U/L — ABNORMAL HIGH (ref 15–41)
Albumin: 3.5 g/dL (ref 3.5–5.0)
Alkaline Phosphatase: 61 U/L (ref 38–126)
Anion gap: 13 (ref 5–15)
BUN: 11 mg/dL (ref 6–20)
CO2: 24 mmol/L (ref 22–32)
Calcium: 8.9 mg/dL (ref 8.9–10.3)
Chloride: 102 mmol/L (ref 98–111)
Creatinine, Ser: 1.26 mg/dL — ABNORMAL HIGH (ref 0.61–1.24)
GFR calc Af Amer: >60 mL/min (ref >60)
GFR calc non Af Amer: >60 mL/min (ref >60)
Glucose, Bld: 143 mg/dL — ABNORMAL HIGH (ref 70–99)
Potassium: 3.4 mmol/L — ABNORMAL LOW (ref 3.5–5.1)
Sodium: 139 mmol/L (ref 135–145)
Total Bilirubin: 1.5 mg/dL — ABNORMAL HIGH (ref 0.3–1.2)
Total Protein: 8.4 g/dL — ABNORMAL HIGH (ref 6.5–8.1)

## 2018-12-13 LAB — CBC
HCT: 42.7 % (ref 39.0–52.0)
Hemoglobin: 13.7 g/dL (ref 13.0–17.0)
MCH: 30 pg (ref 26.0–34.0)
MCHC: 32.1 g/dL (ref 30.0–36.0)
MCV: 93.4 fL (ref 80.0–100.0)
Platelets: 195 10*3/uL (ref 150–400)
RBC: 4.57 MIL/uL (ref 4.22–5.81)
RDW: 15.9 % — ABNORMAL HIGH (ref 11.5–15.5)
WBC: 7.4 10*3/uL (ref 4.0–10.5)
nRBC: 0 % (ref 0.0–0.2)

## 2018-12-13 MED ORDER — SODIUM CHLORIDE 0.9 % IV BOLUS
1000.0000 mL | Freq: Once | INTRAVENOUS | Status: AC
  Administered 2018-12-13: 1000 mL via INTRAVENOUS

## 2018-12-13 MED ORDER — NYSTATIN 100000 UNIT/ML MT SUSP: 500000.0000 [IU] | Freq: Four times a day (QID) | OROMUCOSAL | 0 refills | Status: DC

## 2018-12-13 MED ORDER — METOPROLOL TARTRATE 25 MG PO TABS
25.0000 mg | ORAL_TABLET | Freq: Once | ORAL | Status: AC
  Administered 2018-12-13: 25 mg via ORAL
  Filled 2018-12-13: qty 1

## 2018-12-13 MED ORDER — HEPARIN SOD (PORK) LOCK FLUSH 100 UNIT/ML IV SOLN
500.0000 [IU] | Freq: Once | INTRAVENOUS | Status: AC
  Administered 2018-12-13: 500 [IU]
  Filled 2018-12-13: qty 5

## 2018-12-13 NOTE — ED Provider Notes (Signed)
[facility] Emergency Department Provider Note MRN:  [id number]  Arrival date & time: 12/13/18     Chief Complaint   Anorexia History of Present Illness   RAYYAN BURLEY is a 53 y.o. year-old male with a history of colon cancer presenting to the ED with chief complaint of anorexia.  Patient is endorsing loose no appetite for the past 2 weeks.  Denies nausea or vomiting, endorsing normal bowel movements.  Denies chest pain or shortness of breath, no abdominal pain.  Denies fever, no cough.  Explains that he has been under a lot of stress recently because his mother is sick with coronavirus.  Review of Systems  A complete 10 system review of systems was obtained and all systems are negative except as noted in the HPI and PMH.   Patient's Health History    Past Medical History:  Diagnosis Date  . Allergic rhinitis   . Anxiety   . Arthritis   . Asthma   . At risk for sleep apnea    STOP-BANG= 6       SENT TO PCP 01-22-2015  . Chronic back pain    "from the neck to the lower back"   . 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 Depression   . Dysthymic disorder   . ED (erectile dysfunction)   . GERD (gastroesophageal reflux disease)   . History of acute pyelonephritis    01-07-2015  . History of Graves' disease    vs  Multinodular  . History of hiatal hernia   . History of kidney stones   . History of non-ST elevation myocardial infarction (NSTEMI)    Jan 2014--  no CAD;  per notes probable coronary vasospasm  . History of panic attacks   . History of septic shock    01-07-2015--  ureterolithias/ pyelonephritis  . History of thyroid storm    Nov 2011  . Hyperlipidemia   . Hypertension   . Hypothyroidism following radioiodine therapy    RAI in Mar 2012---  followed by dr Loanne Drilling  . PAD (peripheral artery disease) [facility]) cardiologist-  dr Fletcher Anon   a.  ABI (01/2014):  L 0.53; R 1.0 >> referred to [facility]  . Right ureteral stone   . Septic shock ([place]) 01/05/2015  . Sleep disturbance   . Thrombocytopenia ([place])     Past Surgical History:  Procedure Laterality Date  . ABDOMINAL AORTAGRAM N/A 02/19/2014   Procedure: ABDOMINAL Maxcine Ham;  Surgeon: Wellington Hampshire, MD;  Location: [facility] CATH LAB;  Service: Cardiovascular;  Laterality: N/A;  . ANTERIOR CERVICAL DECOMP/DISCECTOMY FUSION  09-08-2010   C3 -4  . BACK SURGERY     x5, LOWER X 3, UPPER NECK X 2  . CYSTOSCOPY W/ URETERAL STENT PLACEMENT Right 01/04/2015   Procedure: CYSTOSCOPY WITH RETROGRADE PYELOGRAM/URETERAL STENT PLACEMENT;  Surgeon: Franchot Gallo, MD;  Location: [place];  Service: Urology;  Laterality: Right;  . CYSTOSCOPY W/ URETERAL STENT REMOVAL Right 01/26/2015   Procedure: CYSTOSCOPY WITH STENT REMOVAL;  Surgeon: Franchot Gallo, MD;  Location: [facility];  Service: Urology;  Laterality: Right;  . CYSTOSCOPY/URETEROSCOPY/HOLMIUM LASER/STENT PLACEMENT Right 01/26/2015   Procedure: CYSTOSCOPY/URETEROSCOPY RIGHT;  Surgeon: Franchot Gallo, MD;  [facility];  Service: Urology;  Laterality: Right;  . FEMORAL-POPLITEAL BYPASS GRAFT Left 09/11/2015   Procedure: BYPASS GRAFT LEFT ABOVE KNEE TO BELOW KNEE POPLITEAL  ARTERY WITH LEFT GREATER SAPHENOUS VEIN;  Surgeon: Serafina Mitchell, MD;  Location: [place];  Service: Vascular;  Laterality: Left;  . INCISIONAL HERNIA REPAIR N/A 01/19/2016   Procedure: HERNIA REPAIR INCISIONAL;  Surgeon: Leighton Ruff, MD;  Location: [facility];  Service: General;  Laterality: N/A;  . LAMINECTOMY AND MICRODISCECTOMY LUMBAR SPINE  03-26-2009   left L5 -- S1  . LAPAROSCOPIC SIGMOID COLECTOMY N/A 01/14/2016   Procedure: LAPAROSCOPIC SIGMOID COLECTOMY;  Surgeon: Leighton Ruff, MD;  Location: [facility];  Service: General;  Laterality: N/A;  . LEFT HEART CATHETERIZATION WITH CORONARY ANGIOGRAM N/A 06/12/2012   Procedure: LEFT HEART  CATHETERIZATION WITH CORONARY ANGIOGRAM;  Surgeon: Josue Hector, MD;  Location: [facility] CATH LAB;  Service: Cardiovascular;  Laterality: N/A;   No sig. CAD/  normal LVF, ef 55-65%  . LIVER BIOPSY N/A 01/14/2016   Procedure: LIVER BIOPSY;  Surgeon: Leighton Ruff, MD;  Location: [facility];  Service: General;  Laterality: N/A;  . LOWER EXTREMITY ANGIOGRAM Left 05/28/2014   Procedure: LOWER EXTREMITY ANGIOGRAM;  Surgeon: Wellington Hampshire, MD;  Location: [place] CATH LAB;  Service: Cardiovascular;  Laterality: Left;  Failed PTA CTO  . POPLITEAL ARTERY ANGIOPLASTY Left 05/28/2014    dr Fletcher Anon   Attempted and unsuccessful due to inability to cross the occlusionnotes   . PORTACATH PLACEMENT Right 01/14/2016   Procedure: INSERTION PORT-A-CATH;  Surgeon: Leighton Ruff, MD;  Location: [facility];  Service: General;  Laterality: Right;  . POSTERIOR CERVICAL FUSION/FORAMINOTOMY N/A 12/10/2012   Procedure: POSTERIOR CERVICAL FUSION/FORAMINOTOMY LEVEL 1;  Surgeon: Ophelia Charter, MD;  Location: [place] NEURO ORS;  Service: Neurosurgery;  Laterality: N/A;  Cervical three-four posterior cervical fusion with lateral mass screws  . POSTERIOR LUMBAR FUSION  11-11-2009;   07-15-2013   L5 -- S1;   L4-- S1  . SHOULDER ARTHROSCOPY Right 03-08-2004   debridement labral tear/  DCR/  acromioplasty  . TRANSTHORACIC ECHOCARDIOGRAM  01-05-2015   mild LVH/  ef 60-65%/  mild TR  . UMBILICAL HERNIA REPAIR  1980  . VEIN HARVEST Left 09/11/2015   Procedure: LEFT GREATER SAPHENOUS VEIN HARVEST;  Surgeon: Serafina Mitchell, MD;  Location: [facility] OR;  Service: Vascular;  Laterality: Left;    Family History  Problem Relation Age of Onset  . Thyroid disease Mother        hypothyroidism  . Heart attack Maternal Grandfather   . Heart Problems Father        pacermaker  . Edema Father   . Heart disease Maternal Grandmother   . Lung cancer Maternal Grandmother   . Diabetes Maternal Grandmother   . Hypertension Maternal Grandmother     Social History    Socioeconomic History  . Marital status: Married    Spouse name: Not on file  . Number of children: 5  . Years of education: Not on file  . Highest education level: Not on file  Occupational History  . Occupation: Disabled    Fish farm manager: UNEMPLOYED  Social Needs  . Financial resource strain: Not on file  . Food insecurity    Worry: Not on file    Inability: Not on file  . Transportation needs    Medical: Not on file    Non-medical: Not on file  Tobacco Use  . Smoking status: Never Smoker  . Smokeless tobacco: Never Used  Substance and Sexual Activity  . Alcohol use: Yes    Alcohol/week: 0.0 standard drinks    Comment: RARE  . Drug use:  No  . Sexual activity: Not on file  Lifestyle  . Physical activity    Days per week: Not on file    Minutes per session: Not on file  . Stress: Not on file  Relationships  . Social Herbalist on phone: Not on file    Gets together: Not on file    Attends religious service: Not on file    Active member of club or organization: Not on file    Attends meetings of clubs or organizations: Not on file    Relationship status: Not on file  . Intimate partner violence    Fear of current or ex partner: Not on file    Emotionally abused: Not on file    Physically abused: Not on file    Forced sexual activity: Not on file  Other Topics Concern  . Not on file  Social History Narrative   Married - wife works Surveyor, quantity unit @ [employer]   5 daughters   Regular exercise-no   Disabled to due back problem   12/04/2015     Physical Exam  Vital Signs and Nursing Notes reviewed Vitals:   12/13/18 1430 12/13/18 1500  BP: 126/82 124/74  Pulse:  100  Resp: (!) 25 18  Temp:    SpO2:  100%    CONSTITUTIONAL: Well-appearing, NAD NEURO:  Alert and oriented x 3, no focal deficits EYES:  eyes equal and reactive ENT/NECK:  no LAD, no JVD CARDIO: Tachycardic rate, well-perfused, normal S1 and S2 PULM:  CTAB no wheezing or rhonchi GI/GU:   normal bowel sounds, non-distended, non-tender MSK/SPINE:  No gross deformities, no edema SKIN:  no rash, atraumatic PSYCH:  Appropriate speech and behavior  Diagnostic and Interventional Summary    EKG Interpretation  Date/Time:  Thursday December 13 2018 13:40:38 EDT Ventricular Rate:  132 PR Interval:    QRS Duration: 76 QT Interval:  327 QTC Calculation: 485 R Axis:   54 Text Interpretation:  Sinus tachycardia Multiform ventricular premature complexes Aberrant complex Low voltage, precordial leads Borderline T abnormalities, diffuse leads Borderline prolonged QT interval Confirmed by Gerlene Fee [phone]) on 12/13/2018 2:50:26 PM      Labs Reviewed  CBC - Abnormal; Notable for the following components:      Result Value   RDW 15.9 (*)    All other components within normal limits  COMPREHENSIVE METABOLIC PANEL    No orders to display    Medications  sodium chloride 0.9 % bolus 1,000 mL (1,000 mLs Intravenous New Bag/Given 12/13/18 1413)     Procedures Critical Care  ED Course and Medical Decision Making  I have reviewed the triage vital signs and the nursing notes.  Pertinent labs & imaging results that were available during my care of the patient were reviewed by me and considered in my medical decision making (see below for details).  Suspect mild dehydration causing tachycardia in this 53 year old male on oral chemotherapy for colon cancer diagnosed June 2017.  Patient is without fever, largely without complaints, does get a bit lightheaded when standing.  Poor appetite seems related to significant anxiety and recent stressors in his life relating to his mother's illness with coronavirus.  Will obtain liter of fluids, obtain EKG, basic labs to evaluate for metabolic disarray.  No cough, no increased work of breathing, clear lungs, no indication for chest x-ray, no dysuria, no suprapubic tenderness.  Awaiting CMP, signed out to oncoming provider at shift change.  Heart rate  is  improving, anticipating discharge with normal values.  Barth Kirks. Sedonia Small, [place] [email or web address]  Final Clinical Impressions(s) / ED Diagnoses     ICD-10-CM   1. Anorexia  R63.0   2. Dehydration  E86.0     ED Discharge Orders    None         Maudie Flakes, MD 12/13/18 1524

## 2018-12-13 NOTE — ED Notes (Signed)
ED Provider at bedside. 

## 2018-12-13 NOTE — ED Triage Notes (Signed)
Patient states he is on oral chemo and his mother is at [facility] with Covid-19. Patient states he has been unable to eat due to anxiety. Patient states, "I went and got a [employer] sandwich and when I bit into it, It felt like it just swelled up in my mouth and I could not eat." Patient states he has not been eating properly x 2 weeks which includes his chemo pills.

## 2018-12-13 NOTE — ED Provider Notes (Signed)
I received the patient in signout from Dr. Sedonia Small, please see his note for further details care.  Briefly the patient is a 53 year old male with a chief complaints of anxiety.  He tells me that he is really frustrated because he cannot eat.  States that his tongue feels funny.  Does not think that it is nausea does not think that it is difficulty with actual swallowing.  Has been able to take fluids down without issue.  He denies cough or congestion.  Denies fevers or chills.  He has a close contact with the coronavirus.  On my exam the patient is swollen turbinates and posterior nasal drip.  Clinically he has thrush.  This may be the cause of his decreased taste sensation.  He has a trivial bump in his creatinine.  His total bilirubin is mildly elevated at 1.5.  He endorses no abdominal pain.  I wonder if this is secondary to dehydration.  Clinically does appear dehydrated.  His heart rate had improved with IV fluids but on my exam the patient's heart rate is in the 120s.  Improved significantly with lying recumbent.  I wonder if he is orthostatic.  We will give another bolus of IV fluids.  He also is on metoprolol at home, missed his morning dose.  We will give it here.  Heart rate is now in the 80s.  Will discharge the patient home.  PCP follow-up.  Jeff [name] was evaluated in Emergency Department on 12/13/2018 for the symptoms described in the history of present illness. He/she was evaluated in the context of the global COVID-19 pandemic, which necessitated consideration that the patient might be at risk for infection with the SARS-CoV-2 virus that causes COVID-19. Institutional protocols and algorithms that pertain to the evaluation of patients at risk for COVID-19 are in a state of rapid change based on information released by regulatory bodies including the CDC and federal and state organizations. These policies and algorithms were followed during the patient's care in the ED.    Deno Etienne,  DO 12/13/18 [phone]

## 2018-12-13 NOTE — ED Notes (Signed)
An After Visit Summary was printed and given to the patient. Discharge instructions given and no further questions at this time.  

## 2018-12-13 NOTE — Discharge Instructions (Signed)
Eat and drink well for the next couple of days.  Eat what you can.  Follow up with your PCP.  Try to self isolate as best you can.

## 2018-12-14 ENCOUNTER — Telehealth: Payer: Self-pay | Admitting: *Deleted

## 2018-12-14 NOTE — Telephone Encounter (Signed)
Called to f/u on ER visit yesterday. She reports he is feeling better today and is eating as well. Continues his antibiotic for bronchitis. Has started his nystatin suspension for his thrush. No nausea currently. Will start his Xeloda today. Instructed her if it makes him nauseated, he can take anti-emetic 1 hour prior to dose. Call office with any questions or problems. She reports his mother is still in ICU on vent at [facility].

## 2018-12-15 LAB — NOVEL CORONAVIRUS, NAA (HOSP ORDER, SEND-OUT TO REF LAB; TAT 18-24 HRS): SARS-CoV-2, NAA: DETECTED — AB

## 2019-01-03 ENCOUNTER — Other Ambulatory Visit: Payer: Self-pay

## 2019-01-03 ENCOUNTER — Inpatient Hospital Stay: Payer: Medicaid Other | Attending: Nurse Practitioner

## 2019-01-03 ENCOUNTER — Telehealth: Payer: Self-pay | Admitting: *Deleted

## 2019-01-03 ENCOUNTER — Inpatient Hospital Stay: Payer: Medicaid Other

## 2019-01-03 ENCOUNTER — Inpatient Hospital Stay (HOSPITAL_BASED_OUTPATIENT_CLINIC_OR_DEPARTMENT_OTHER): Payer: Medicaid Other | Admitting: Oncology

## 2019-01-03 ENCOUNTER — Other Ambulatory Visit: Payer: Self-pay | Admitting: Oncology

## 2019-01-03 VITALS — BP 143/94 | HR 96 | Temp 98.2°F | Resp 18 | Ht 70.0 in | Wt 251.3 lb

## 2019-01-03 DIAGNOSIS — C187 Malignant neoplasm of sigmoid colon: Secondary | ICD-10-CM

## 2019-01-03 DIAGNOSIS — E119 Type 2 diabetes mellitus without complications: Secondary | ICD-10-CM | POA: Diagnosis not present

## 2019-01-03 DIAGNOSIS — Z95828 Presence of other vascular implants and grafts: Secondary | ICD-10-CM

## 2019-01-03 DIAGNOSIS — I10 Essential (primary) hypertension: Secondary | ICD-10-CM | POA: Insufficient documentation

## 2019-01-03 DIAGNOSIS — Z794 Long term (current) use of insulin: Secondary | ICD-10-CM | POA: Insufficient documentation

## 2019-01-03 DIAGNOSIS — R110 Nausea: Secondary | ICD-10-CM | POA: Diagnosis not present

## 2019-01-03 DIAGNOSIS — C787 Secondary malignant neoplasm of liver and intrahepatic bile duct: Secondary | ICD-10-CM

## 2019-01-03 DIAGNOSIS — Z8619 Personal history of other infectious and parasitic diseases: Secondary | ICD-10-CM | POA: Insufficient documentation

## 2019-01-03 DIAGNOSIS — C189 Malignant neoplasm of colon, unspecified: Secondary | ICD-10-CM | POA: Diagnosis not present

## 2019-01-03 LAB — CBC WITH DIFFERENTIAL (CANCER CENTER ONLY)
Abs Immature Granulocytes: 0.03 10*3/uL (ref 0.00–0.07)
Basophils Absolute: 0 10*3/uL (ref 0.0–0.1)
Basophils Relative: 0 %
Eosinophils Absolute: 0.3 10*3/uL (ref 0.0–0.5)
Eosinophils Relative: 4 %
HCT: 38.4 % — ABNORMAL LOW (ref 39.0–52.0)
Hemoglobin: 12.6 g/dL — ABNORMAL LOW (ref 13.0–17.0)
Immature Granulocytes: 0 %
Lymphocytes Relative: 29 %
Lymphs Abs: 2 10*3/uL (ref 0.7–4.0)
MCH: 30.1 pg (ref 26.0–34.0)
MCHC: 32.8 g/dL (ref 30.0–36.0)
MCV: 91.9 fL (ref 80.0–100.0)
Monocytes Absolute: 0.6 10*3/uL (ref 0.1–1.0)
Monocytes Relative: 9 %
Neutro Abs: 3.9 10*3/uL (ref 1.7–7.7)
Neutrophils Relative %: 58 %
Platelet Count: 136 10*3/uL — ABNORMAL LOW (ref 150–400)
RBC: 4.18 MIL/uL — ABNORMAL LOW (ref 4.22–5.81)
RDW: 15.9 % — ABNORMAL HIGH (ref 11.5–15.5)
WBC Count: 6.8 10*3/uL (ref 4.0–10.5)
nRBC: 0 % (ref 0.0–0.2)

## 2019-01-03 LAB — CMP (CANCER CENTER ONLY)
ALT: 34 U/L (ref 0–44)
AST: 31 U/L (ref 15–41)
Albumin: 3.5 g/dL (ref 3.5–5.0)
Alkaline Phosphatase: 90 U/L (ref 38–126)
Anion gap: 15 (ref 5–15)
BUN: 6 mg/dL (ref 6–20)
CO2: 20 mmol/L — ABNORMAL LOW (ref 22–32)
Calcium: 8.6 mg/dL — ABNORMAL LOW (ref 8.9–10.3)
Chloride: 104 mmol/L (ref 98–111)
Creatinine: 1.11 mg/dL (ref 0.61–1.24)
GFR, Est AFR Am: >60 mL/min (ref >60)
GFR, Estimated: >60 mL/min (ref >60)
Glucose, Bld: 191 mg/dL — ABNORMAL HIGH (ref 70–99)
Potassium: 3.4 mmol/L — ABNORMAL LOW (ref 3.5–5.1)
Sodium: 139 mmol/L (ref 135–145)
Total Bilirubin: 0.4 mg/dL (ref 0.3–1.2)
Total Protein: 7.3 g/dL (ref 6.5–8.1)

## 2019-01-03 LAB — CEA (IN HOUSE-CHCC): CEA (CHCC-In House): 3.25 ng/mL (ref 0.00–5.00)

## 2019-01-03 MED ORDER — SODIUM CHLORIDE 0.9% FLUSH
10.0000 mL | INTRAVENOUS | Status: DC | PRN
  Administered 2019-01-03: 10 mL via INTRAVENOUS
  Filled 2019-01-03: qty 10

## 2019-01-03 MED ORDER — LIDOCAINE-PRILOCAINE 2.5-2.5 % EX CREA: 1.0000 "application " | TOPICAL_CREAM | CUTANEOUS | 3 refills | Status: DC | PRN

## 2019-01-03 MED ORDER — HEPARIN SOD (PORK) LOCK FLUSH 100 UNIT/ML IV SOLN
500.0000 [IU] | Freq: Once | INTRAVENOUS | Status: AC | PRN
  Administered 2019-01-03: 500 [IU] via INTRAVENOUS
  Filled 2019-01-03: qty 5

## 2019-01-03 NOTE — Telephone Encounter (Signed)
TCT patient regarding normal CEA results. Spoke with pt's wife and advised her of the normal results. She voiced understanding and she will share this with her husband.  No questions or concerns. Pt is to follow up as scheduled.

## 2019-01-03 NOTE — Telephone Encounter (Signed)
-----   Message from Ladell Pier, MD sent at 01/03/2019  1:49 PM EDT ----- Please call patient, CEA is normal, follow-up as scheduled

## 2019-01-03 NOTE — Progress Notes (Signed)
[place] OFFICE PROGRESS NOTE   Diagnosis: Colon cancer  INTERVAL HISTORY:   Jeff [name] returns as scheduled.  He continues Xeloda.  No mouth sores, diarrhea, or hand/foot pain.  He takes Compazine and Phenergan for intermittent nausea. He relates weight loss to eliminating sugar from his diet.  He has a good appetite.  His mother recently passed away from [facility].  He tested positive for COVID on 12/13/2018.  He had no symptoms.  He reports isolating for 14 days.  Objective:  Vital signs in last 24 hours:  Blood pressure (!) 143/94, pulse 96, temperature 98.2 F (36.8 C), temperature source Oral, resp. rate 18, height '5\' 10"'$  (1.778 m), weight 251 lb 4.8 oz (114 kg), SpO2 96 %. Limited physical examination secondary to distancing with the coded pandemic  Skin: Palms without erythema  Portacath/PICC-without erythema  Lab Results:  Lab Results  Component Value Date   WBC 6.8 01/03/2019   HGB 12.6 (L) 01/03/2019   HCT 38.4 (L) 01/03/2019   MCV 91.9 01/03/2019   PLT 136 (L) 01/03/2019   NEUTROABS 3.9 01/03/2019    CMP  Lab Results  Component Value Date   NA 139 01/03/2019   K 3.4 (L) 01/03/2019   CL 104 01/03/2019   CO2 20 (L) 01/03/2019   GLUCOSE 191 (H) 01/03/2019   BUN 6 01/03/2019   CREATININE 1.11 01/03/2019   CALCIUM 8.6 (L) 01/03/2019   PROT 7.3 01/03/2019   ALBUMIN 3.5 01/03/2019   AST 31 01/03/2019   ALT 34 01/03/2019   ALKPHOS 90 01/03/2019   BILITOT 0.4 01/03/2019   GFRNONAA >60 01/03/2019   GFRAA >60 01/03/2019    Lab Results  Component Value Date   CEA1 3.81 11/23/2018     Medications: I have reviewed the patient's current medications.   Assessment/Plan: 1. 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 CTschest, abdomen, and pelvis on 12/11/2015-indeterminate tiny pulmonary nodules, multiple liver metastases, small nodes in the sigmoid  mesocolon  3. Laparoscopic sigmoid colectomy, liver biopsy, Port-A-Cath placement 01/14/2016  4. Pathology sigmoid colon resection-colonic adenocarcinoma, 5.3 cm extending into pericolonic connective tissue, positive lymph-vascular invasion, positive perineural invasion, negative margins, metastatic carcinoma in9 of 28 lymph nodes  5. Pathology liver biopsy-metastatic colorectal adenocarcinoma  6. MSI stable; mismatch repair protein normal  7. 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 Cycle 1 FOLFOX 02/02/2016  9. Cycle 2 FOLFOX 02/15/2016  10. Cycle 3 FOLFOX 02/29/2016  11. Cycle 4 FOLFOX 03/14/2016  12. Cycle 5 FOLFOX 03/28/2016  13. Cycle 6 FOLFOX 04/11/2016 (oxaliplatin held secondary to thrombocytopenia)  14. 04/21/2016 restaging CTs-difficulty evaluating liver lesions due to hepatic steatosis. Stable right adrenal nodule. No adenopathy or local recurrence near the rectosigmoid anastomotic site.  15. Cycle 7 FOLFOX 04/25/2016  16. MRI liver 05/02/2016-partial improvement in hepatic metastases  17. Cycle 8 FOLFOX 05/10/2016  18. Cycle 9 FOLFOX 05/23/2016 (oxaliplatin held due to thrombocytopenia)  19. Cycle 10 FOLFOX 06/06/2016 (oxaliplatin dose reduced due to thrombocytopenia)  20. Cycle 11 FOLFOX 06/27/2016 (oxaliplatin held due to neuropathy)  21. Cycle 12 FOLFOX 07/11/2016 (oxaliplatin held) 22. Initiation of maintenance Xeloda 7 days on/7 days off 07/27/2016 23. MRI liver 11/18/2016-decrease in hepatic metastatic disease. No new or progressive disease identified within the abdomen. 24. Continuation of Xeloda 7 days on/7 days off 25. MRI liver 04/27/2017-previous liver lesions not identified, no new lesions, no lymphadenopathy 26. Xeloda continued 7 days  on/7 days off 27. MRI liver 12/04/2017 - no evidence of metastatic disease, hepatic steatosis 28. Xeloda continued 7 days on/7 days off 29. MRI liver 07/15/2018- no evidence of metastatic disease.  Stable severe hepatic  steatosis. 30. Xeloda continue 7 days on/7 days off  2. Rectal bleeding and constipation secondary to #1  3. History of peripheral vascular disease, status post left lower extremity vascular bypass surgery in April 2017  4. History of nephrolithiasis  5. History of Graves' disease treated with radioactive iodine  6. Anxiety/depression  7. Hypertension  8. Hospitalization 01/19/2016 with wound dehiscence status post secondary suture closure of abdominal wall  9. 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 Hyperglycemia 06/20/2016-diagnosed with diabetes, maintained on insulin  11.  Positive COVID test 12/13/2018  Disposition: Jeff [name] appears stable.  He continues Xeloda on a 7-day on/7-day off schedule.  He appears to be tolerating Xeloda well.  There is no clinical evidence of disease progression.  He will be scheduled for a restaging liver MRI after the next office visit.  We will follow-up on the CEA from today.  Mr. Albright will return for an office and lab visit in 6 weeks.  15 minutes were spent with the patient today.  The majority of the time was used for counseling and coordination of care.  Betsy Coder, MD  01/03/2019  10:00 AM

## 2019-01-04 ENCOUNTER — Telehealth: Payer: Self-pay | Admitting: Oncology

## 2019-01-04 NOTE — Telephone Encounter (Signed)
Called and spoke with patients wife. Confirmed Oct. appt

## 2019-01-07 MED FILL — CAPECITABINE 500 MG TABS: 500 | 28 days supply | Qty: 112 | Fill #0

## 2019-02-01 ENCOUNTER — Other Ambulatory Visit: Payer: Self-pay | Admitting: Oncology

## 2019-02-01 DIAGNOSIS — C187 Malignant neoplasm of sigmoid colon: Secondary | ICD-10-CM

## 2019-02-06 MED FILL — CAPECITABINE 500 MG TABS: 500 | 28 days supply | Qty: 112 | Fill #0

## 2019-02-14 ENCOUNTER — Ambulatory Visit: Payer: Medicaid Other | Admitting: Nurse Practitioner

## 2019-02-14 ENCOUNTER — Other Ambulatory Visit: Payer: Self-pay

## 2019-02-14 ENCOUNTER — Other Ambulatory Visit: Payer: Medicaid Other

## 2019-02-14 ENCOUNTER — Inpatient Hospital Stay: Payer: Medicaid Other

## 2019-02-14 ENCOUNTER — Inpatient Hospital Stay (HOSPITAL_BASED_OUTPATIENT_CLINIC_OR_DEPARTMENT_OTHER): Payer: Medicaid Other | Admitting: Nurse Practitioner

## 2019-02-14 ENCOUNTER — Telehealth: Payer: Self-pay

## 2019-02-14 ENCOUNTER — Inpatient Hospital Stay: Payer: Medicaid Other | Attending: Nurse Practitioner

## 2019-02-14 ENCOUNTER — Encounter: Payer: Self-pay | Admitting: Nurse Practitioner

## 2019-02-14 VITALS — BP 125/75 | HR 103 | Temp 98.0°F | Resp 17 | Ht 70.0 in | Wt 252.6 lb

## 2019-02-14 DIAGNOSIS — Z794 Long term (current) use of insulin: Secondary | ICD-10-CM | POA: Insufficient documentation

## 2019-02-14 DIAGNOSIS — C787 Secondary malignant neoplasm of liver and intrahepatic bile duct: Secondary | ICD-10-CM | POA: Diagnosis present

## 2019-02-14 DIAGNOSIS — C187 Malignant neoplasm of sigmoid colon: Secondary | ICD-10-CM | POA: Insufficient documentation

## 2019-02-14 DIAGNOSIS — E119 Type 2 diabetes mellitus without complications: Secondary | ICD-10-CM | POA: Diagnosis not present

## 2019-02-14 DIAGNOSIS — Z95828 Presence of other vascular implants and grafts: Secondary | ICD-10-CM

## 2019-02-14 DIAGNOSIS — C189 Malignant neoplasm of colon, unspecified: Secondary | ICD-10-CM | POA: Diagnosis not present

## 2019-02-14 DIAGNOSIS — Z8619 Personal history of other infectious and parasitic diseases: Secondary | ICD-10-CM | POA: Diagnosis not present

## 2019-02-14 DIAGNOSIS — F40240 Claustrophobia: Secondary | ICD-10-CM | POA: Diagnosis not present

## 2019-02-14 DIAGNOSIS — I10 Essential (primary) hypertension: Secondary | ICD-10-CM | POA: Diagnosis not present

## 2019-02-14 LAB — CBC WITH DIFFERENTIAL (CANCER CENTER ONLY)
Abs Immature Granulocytes: 0.01 10*3/uL (ref 0.00–0.07)
Basophils Absolute: 0 10*3/uL (ref 0.0–0.1)
Basophils Relative: 0 %
Eosinophils Absolute: 0.3 10*3/uL (ref 0.0–0.5)
Eosinophils Relative: 4 %
HCT: 39.4 % (ref 39.0–52.0)
Hemoglobin: 13 g/dL (ref 13.0–17.0)
Immature Granulocytes: 0 %
Lymphocytes Relative: 38 %
Lymphs Abs: 2.8 10*3/uL (ref 0.7–4.0)
MCH: 30.6 pg (ref 26.0–34.0)
MCHC: 33 g/dL (ref 30.0–36.0)
MCV: 92.7 fL (ref 80.0–100.0)
Monocytes Absolute: 0.7 10*3/uL (ref 0.1–1.0)
Monocytes Relative: 9 %
Neutro Abs: 3.8 10*3/uL (ref 1.7–7.7)
Neutrophils Relative %: 49 %
Platelet Count: 160 10*3/uL (ref 150–400)
RBC: 4.25 MIL/uL (ref 4.22–5.81)
RDW: 15.8 % — ABNORMAL HIGH (ref 11.5–15.5)
WBC Count: 7.6 10*3/uL (ref 4.0–10.5)
nRBC: 0 % (ref 0.0–0.2)

## 2019-02-14 LAB — CMP (CANCER CENTER ONLY)
ALT: 47 U/L — ABNORMAL HIGH (ref 0–44)
AST: 43 U/L — ABNORMAL HIGH (ref 15–41)
Albumin: 4 g/dL (ref 3.5–5.0)
Alkaline Phosphatase: 92 U/L (ref 38–126)
Anion gap: 16 — ABNORMAL HIGH (ref 5–15)
BUN: 11 mg/dL (ref 6–20)
CO2: 21 mmol/L — ABNORMAL LOW (ref 22–32)
Calcium: 9 mg/dL (ref 8.9–10.3)
Chloride: 103 mmol/L (ref 98–111)
Creatinine: 1.25 mg/dL — ABNORMAL HIGH (ref 0.61–1.24)
GFR, Est AFR Am: >60 mL/min (ref >60)
GFR, Estimated: >60 mL/min (ref >60)
Glucose, Bld: 188 mg/dL — ABNORMAL HIGH (ref 70–99)
Potassium: 3.4 mmol/L — ABNORMAL LOW (ref 3.5–5.1)
Sodium: 140 mmol/L (ref 135–145)
Total Bilirubin: 0.7 mg/dL (ref 0.3–1.2)
Total Protein: 7.4 g/dL (ref 6.5–8.1)

## 2019-02-14 LAB — CEA (IN HOUSE-CHCC): CEA (CHCC-In House): 3.42 ng/mL (ref 0.00–5.00)

## 2019-02-14 MED ORDER — SODIUM CHLORIDE 0.9% FLUSH
10.0000 mL | INTRAVENOUS | Status: DC | PRN
  Administered 2019-02-14: 10 mL via INTRAVENOUS
  Filled 2019-02-14: qty 10

## 2019-02-14 MED ORDER — HEPARIN SOD (PORK) LOCK FLUSH 100 UNIT/ML IV SOLN
500.0000 [IU] | Freq: Once | INTRAVENOUS | Status: AC | PRN
  Administered 2019-02-14: 500 [IU] via INTRAVENOUS
  Filled 2019-02-14: qty 5

## 2019-02-14 MED ORDER — DIAZEPAM 5 MG PO TABS: 5.0000 mg | ORAL_TABLET | Freq: Once | ORAL | 0 refills | Status: DC

## 2019-02-14 NOTE — Telephone Encounter (Signed)
TC to pt per Ned Card NP to let him know the CEA is stable in normal range. Follow-up as scheduled. Patient verbalized understanding. No further problems or concerns at this time.

## 2019-02-14 NOTE — Progress Notes (Signed)
[place] OFFICE PROGRESS NOTE   Diagnosis: Colon cancer  INTERVAL HISTORY:   Jeff [name] returns as scheduled.  He continues Xeloda.  He feels well.  No nausea or vomiting.  No mouth sores.  No diarrhea.  No hand or foot pain or redness.  He denies abdominal pain.  Objective:  Vital signs in last 24 hours:  Blood pressure 125/75, pulse (!) 103, temperature 98 F (36.7 C), temperature source Temporal, resp. rate 17, height 5' 10" (1.778 m), weight 252 lb 9.6 oz (114.6 kg), SpO2 100 %.    HEENT: No thrush or ulcers. Lymphatics: No palpable cervical, supraclavicular or axillary lymph nodes. GI: Abdomen soft and nontender.  No hepatomegaly. Vascular: No leg edema. Neuro: Alert and oriented. Skin: Palms with mild erythema. Port-A-Cath without erythema.   Lab Results:  Lab Results  Component Value Date   WBC 6.8 01/03/2019   HGB 12.6 (L) 01/03/2019   HCT 38.4 (L) 01/03/2019   MCV 91.9 01/03/2019   PLT 136 (L) 01/03/2019   NEUTROABS 3.9 01/03/2019    Imaging:  No results found.  Medications: I have reviewed the patient's current medications.  Assessment/Plan: 1. 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 CTschest, abdomen, and pelvis on 12/11/2015-indeterminate tiny pulmonary nodules, multiple liver metastases, small nodes in the sigmoid mesocolon  3. Laparoscopic sigmoid colectomy, liver biopsy, Port-A-Cath placement 01/14/2016  4. Pathology sigmoid colon resection-colonic adenocarcinoma, 5.3 cm extending into pericolonic connective tissue, positive lymph-vascular invasion, positive perineural invasion, negative margins, metastatic carcinoma in9 of 28 lymph nodes  5. Pathology liver biopsy-metastatic colorectal adenocarcinoma  6. MSI stable; mismatch repair protein normal  7. 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 Cycle 1 FOLFOX 02/02/2016  9.  Cycle 2 FOLFOX 02/15/2016  10. Cycle 3 FOLFOX 02/29/2016  11. Cycle 4 FOLFOX 03/14/2016  12. Cycle 5 FOLFOX 03/28/2016  13. Cycle 6 FOLFOX 04/11/2016 (oxaliplatin held secondary to thrombocytopenia)  14. 04/21/2016 restaging CTs-difficulty evaluating liver lesions due to hepatic steatosis. Stable right adrenal nodule. No adenopathy or local recurrence near the rectosigmoid anastomotic site.  15. Cycle 7 FOLFOX 04/25/2016  16. MRI liver 05/02/2016-partial improvement in hepatic metastases  17. Cycle 8 FOLFOX 05/10/2016  18. Cycle 9 FOLFOX 05/23/2016 (oxaliplatin held due to thrombocytopenia)  19. Cycle 10 FOLFOX 06/06/2016 (oxaliplatin dose reduced due to thrombocytopenia)  20. Cycle 11 FOLFOX 06/27/2016 (oxaliplatin held due to neuropathy)  21. Cycle 12 FOLFOX 07/11/2016 (oxaliplatin held) 22. Initiation of maintenance Xeloda 7 days on/7 days off 07/27/2016 23. MRI liver 11/18/2016-decrease in hepatic metastatic disease. No new or progressive disease identified within the abdomen. 24. Continuation of Xeloda 7 days on/7 days off 25. MRI liver 04/27/2017-previous liver lesions not identified, no new lesions, no lymphadenopathy 26. Xeloda continued 7 days on/7 days off 27. MRI liver 12/04/2017 - no evidence of metastatic disease, hepatic steatosis 28. Xeloda continued 7 days on/7 days off 29. MRI liver 07/15/2018- no evidence of metastatic disease. Stable severe hepatic steatosis. 30. Xeloda continued 7 days on/7 days off  2. Rectal bleeding and constipation secondary to #1  3. History of peripheral vascular disease, status post left lower extremity vascular bypass surgery in April 2017  4. History of nephrolithiasis  5. History of Graves' disease treated with radioactive iodine  6. Anxiety/depression  7. Hypertension  8. Hospitalization 01/19/2016 with wound dehiscence status post secondary suture closure of abdominal wall  9. Thrombocytopeniasecondary to  chemotherapy-oxaliplatin  held with cycle 6 and cycle 9 FOLFOX  10. Hyperglycemia 06/20/2016-diagnosed with diabetes, maintained on insulin  11.  Positive COVID test 12/13/2018   Disposition: Jeff [name] appears stable.  He continues Xeloda.  There is no clinical evidence of disease progression.  He will continue the same.  He will undergo a restaging MRI of the liver prior to his next visit.  He reports claustrophobia and would like a prescription for Valium prior to the MRI.  This will be sent to his pharmacy.  He understands he will need a driver due to potential for sedation with Valium.  He will return for lab, Port-A-Cath flush and follow-up in 6 weeks, MRI a few days prior.  He will contact the office in the interim with any problems.    Ned Card ANP/GNP-BC   02/14/2019  9:01 AM

## 2019-02-18 ENCOUNTER — Telehealth: Payer: Self-pay | Admitting: Oncology

## 2019-02-18 NOTE — Telephone Encounter (Signed)
Scheduled per los. Called and left msg. Mailed printout  °

## 2019-02-26 ENCOUNTER — Other Ambulatory Visit: Payer: Self-pay | Admitting: *Deleted

## 2019-02-26 ENCOUNTER — Other Ambulatory Visit: Payer: Self-pay | Admitting: Oncology

## 2019-02-26 DIAGNOSIS — C187 Malignant neoplasm of sigmoid colon: Secondary | ICD-10-CM

## 2019-02-26 DIAGNOSIS — C787 Secondary malignant neoplasm of liver and intrahepatic bile duct: Secondary | ICD-10-CM

## 2019-02-26 DIAGNOSIS — C189 Malignant neoplasm of colon, unspecified: Secondary | ICD-10-CM

## 2019-02-26 MED ORDER — PROCHLORPERAZINE MALEATE 10 MG PO TABS: 10.0000 mg | ORAL_TABLET | Freq: Four times a day (QID) | ORAL | 0 refills | Status: DC | PRN

## 2019-03-04 ENCOUNTER — Telehealth: Payer: Self-pay | Admitting: Cardiovascular Disease

## 2019-03-04 ENCOUNTER — Other Ambulatory Visit: Payer: Self-pay | Admitting: Oncology

## 2019-03-04 DIAGNOSIS — C187 Malignant neoplasm of sigmoid colon: Secondary | ICD-10-CM

## 2019-03-04 NOTE — Telephone Encounter (Signed)
Called patient, but, phone just rang and no voicemail. °

## 2019-03-06 MED FILL — CAPECITABINE 500 MG TABS: 500 | 28 days supply | Qty: 112 | Fill #0

## 2019-03-14 ENCOUNTER — Other Ambulatory Visit: Payer: Self-pay | Admitting: Cardiovascular Disease

## 2019-03-15 ENCOUNTER — Other Ambulatory Visit: Payer: Self-pay | Admitting: Cardiovascular Disease

## 2019-03-15 ENCOUNTER — Other Ambulatory Visit: Payer: Self-pay

## 2019-03-15 NOTE — Telephone Encounter (Signed)
Refill request

## 2019-03-16 ENCOUNTER — Ambulatory Visit
Admission: RE | Admit: 2019-03-16 | Discharge: 2019-03-16 | Disposition: A | Discharge status: Home / Self Care | Payer: Medicaid Other | Source: Ambulatory Visit | Attending: Nurse Practitioner | Admitting: Nurse Practitioner

## 2019-03-16 ENCOUNTER — Other Ambulatory Visit: Payer: Self-pay

## 2019-03-16 DIAGNOSIS — C787 Secondary malignant neoplasm of liver and intrahepatic bile duct: Secondary | ICD-10-CM

## 2019-03-16 DIAGNOSIS — C189 Malignant neoplasm of colon, unspecified: Secondary | ICD-10-CM

## 2019-03-16 MED ORDER — GADOBENATE DIMEGLUMINE 529 MG/ML IV SOLN
20.0000 mL | Freq: Once | INTRAVENOUS | Status: AC | PRN
  Administered 2019-03-16: 20 mL via INTRAVENOUS

## 2019-03-19 ENCOUNTER — Telehealth: Payer: Self-pay | Admitting: *Deleted

## 2019-03-19 DIAGNOSIS — M546 Pain in thoracic spine: Secondary | ICD-10-CM | POA: Insufficient documentation

## 2019-03-19 NOTE — Telephone Encounter (Addendum)
Left VM for patient to return call--non-urgent. Left 2nd VM on 03/20/2019. Wife returned call and was provided normal results.

## 2019-03-20 ENCOUNTER — Other Ambulatory Visit: Payer: Self-pay

## 2019-03-22 ENCOUNTER — Other Ambulatory Visit: Payer: Self-pay

## 2019-03-22 ENCOUNTER — Other Ambulatory Visit: Payer: Self-pay | Admitting: Cardiovascular Disease

## 2019-03-22 MED ORDER — AMLODIPINE BESYLATE 5 MG PO TABS: 5.0000 mg | ORAL_TABLET | Freq: Every day | ORAL | 0 refills | Status: DC

## 2019-03-22 NOTE — Telephone Encounter (Signed)
Refill

## 2019-03-28 ENCOUNTER — Other Ambulatory Visit: Payer: Self-pay

## 2019-03-28 ENCOUNTER — Inpatient Hospital Stay: Payer: Medicaid Other

## 2019-03-28 ENCOUNTER — Inpatient Hospital Stay: Payer: Medicaid Other | Attending: Nurse Practitioner | Admitting: Oncology

## 2019-03-28 VITALS — BP 115/85 | HR 92 | Temp 98.3°F | Resp 17 | Ht 70.0 in | Wt 253.1 lb

## 2019-03-28 DIAGNOSIS — R200 Anesthesia of skin: Secondary | ICD-10-CM | POA: Insufficient documentation

## 2019-03-28 DIAGNOSIS — C787 Secondary malignant neoplasm of liver and intrahepatic bile duct: Secondary | ICD-10-CM | POA: Insufficient documentation

## 2019-03-28 DIAGNOSIS — Z8619 Personal history of other infectious and parasitic diseases: Secondary | ICD-10-CM | POA: Diagnosis not present

## 2019-03-28 DIAGNOSIS — Z794 Long term (current) use of insulin: Secondary | ICD-10-CM | POA: Diagnosis not present

## 2019-03-28 DIAGNOSIS — C189 Malignant neoplasm of colon, unspecified: Secondary | ICD-10-CM

## 2019-03-28 DIAGNOSIS — I10 Essential (primary) hypertension: Secondary | ICD-10-CM | POA: Insufficient documentation

## 2019-03-28 DIAGNOSIS — Z95828 Presence of other vascular implants and grafts: Secondary | ICD-10-CM

## 2019-03-28 DIAGNOSIS — E119 Type 2 diabetes mellitus without complications: Secondary | ICD-10-CM | POA: Insufficient documentation

## 2019-03-28 DIAGNOSIS — C187 Malignant neoplasm of sigmoid colon: Secondary | ICD-10-CM | POA: Diagnosis not present

## 2019-03-28 LAB — CMP (CANCER CENTER ONLY)
ALT: 72 U/L — ABNORMAL HIGH (ref 0–44)
AST: 56 U/L — ABNORMAL HIGH (ref 15–41)
Albumin: 4 g/dL (ref 3.5–5.0)
Alkaline Phosphatase: 117 U/L (ref 38–126)
Anion gap: 12 (ref 5–15)
BUN: 7 mg/dL (ref 6–20)
CO2: 25 mmol/L (ref 22–32)
Calcium: 9.3 mg/dL (ref 8.9–10.3)
Chloride: 105 mmol/L (ref 98–111)
Creatinine: 1.25 mg/dL — ABNORMAL HIGH (ref 0.61–1.24)
GFR, Est AFR Am: >60 mL/min (ref >60)
GFR, Estimated: >60 mL/min (ref >60)
Glucose, Bld: 133 mg/dL — ABNORMAL HIGH (ref 70–99)
Potassium: 3.8 mmol/L (ref 3.5–5.1)
Sodium: 142 mmol/L (ref 135–145)
Total Bilirubin: 0.6 mg/dL (ref 0.3–1.2)
Total Protein: 7.6 g/dL (ref 6.5–8.1)

## 2019-03-28 LAB — CEA (IN HOUSE-CHCC): CEA (CHCC-In House): 5.29 ng/mL — ABNORMAL HIGH (ref 0.00–5.00)

## 2019-03-28 LAB — CBC WITH DIFFERENTIAL (CANCER CENTER ONLY)
Abs Immature Granulocytes: 0.02 10*3/uL (ref 0.00–0.07)
Basophils Absolute: 0 10*3/uL (ref 0.0–0.1)
Basophils Relative: 0 %
Eosinophils Absolute: 0.3 10*3/uL (ref 0.0–0.5)
Eosinophils Relative: 4 %
HCT: 43.3 % (ref 39.0–52.0)
Hemoglobin: 14.1 g/dL (ref 13.0–17.0)
Immature Granulocytes: 0 %
Lymphocytes Relative: 38 %
Lymphs Abs: 3 10*3/uL (ref 0.7–4.0)
MCH: 29.7 pg (ref 26.0–34.0)
MCHC: 32.6 g/dL (ref 30.0–36.0)
MCV: 91.4 fL (ref 80.0–100.0)
Monocytes Absolute: 0.5 10*3/uL (ref 0.1–1.0)
Monocytes Relative: 7 %
Neutro Abs: 4.2 10*3/uL (ref 1.7–7.7)
Neutrophils Relative %: 51 %
Platelet Count: 173 10*3/uL (ref 150–400)
RBC: 4.74 MIL/uL (ref 4.22–5.81)
RDW: 16.1 % — ABNORMAL HIGH (ref 11.5–15.5)
WBC Count: 8.1 10*3/uL (ref 4.0–10.5)
nRBC: 0 % (ref 0.0–0.2)

## 2019-03-28 MED ORDER — SODIUM CHLORIDE 0.9% FLUSH
10.0000 mL | INTRAVENOUS | Status: DC | PRN
  Administered 2019-03-28: 10 mL via INTRAVENOUS
  Filled 2019-03-28: qty 10

## 2019-03-28 MED ORDER — HEPARIN SOD (PORK) LOCK FLUSH 100 UNIT/ML IV SOLN
500.0000 [IU] | Freq: Once | INTRAVENOUS | Status: AC | PRN
  Administered 2019-03-28: 500 [IU] via INTRAVENOUS
  Filled 2019-03-28: qty 5

## 2019-03-28 NOTE — Progress Notes (Signed)
[place] OFFICE PROGRESS NOTE   Diagnosis: Colon cancer  INTERVAL HISTORY:   Jeff [name] continues Xeloda.  He will complete the current week today.  No mouth sores or hand/foot pain.  Stable numbness in the feet.  He has diarrhea when he takes Metformin.  Good appetite.  Objective:  Vital signs in last 24 hours:  Blood pressure 115/85, pulse 92, temperature 98.3 F (36.8 C), temperature source Temporal, resp. rate 17, height '5\' 10"'$  (1.778 m), weight 253 lb 1.6 oz (114.8 kg), SpO2 100 %.    Limited physical examination secondary to distancing with the Covid pandemic Lymphatics: No cervical, supraclavicular, axillary, or inguinal nodes GI: No hepatosplenomegaly, no mass, nontender Vascular: No leg edema    Portacath/PICC-without erythema  Lab Results:  Lab Results  Component Value Date   WBC 8.1 03/28/2019   HGB 14.1 03/28/2019   HCT 43.3 03/28/2019   MCV 91.4 03/28/2019   PLT 173 03/28/2019   NEUTROABS 4.2 03/28/2019    CMP  Lab Results  Component Value Date   NA 142 03/28/2019   K 3.8 03/28/2019   CL 105 03/28/2019   CO2 25 03/28/2019   GLUCOSE 133 (H) 03/28/2019   BUN 7 03/28/2019   CREATININE 1.25 (H) 03/28/2019   CALCIUM 9.3 03/28/2019   PROT 7.6 03/28/2019   ALBUMIN 4.0 03/28/2019   AST 56 (H) 03/28/2019   ALT 72 (H) 03/28/2019   ALKPHOS 117 03/28/2019   BILITOT 0.6 03/28/2019   GFRNONAA >60 03/28/2019   GFRAA >60 03/28/2019    Lab Results  Component Value Date   CEA1 5.29 (H) 03/28/2019    Lab Results  Component Value Date   INR 0.98 01/19/2016      Medications: I have reviewed the patient's current medications.   Assessment/Plan: 1. 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 CTschest, abdomen, and pelvis on 12/11/2015-indeterminate tiny pulmonary nodules, multiple liver metastases, small nodes in the sigmoid mesocolon  3.  Laparoscopic sigmoid colectomy, liver biopsy, Port-A-Cath placement 01/14/2016  4. Pathology sigmoid colon resection-colonic adenocarcinoma, 5.3 cm extending into pericolonic connective tissue, positive lymph-vascular invasion, positive perineural invasion, negative margins, metastatic carcinoma in9 of 28 lymph nodes  5. Pathology liver biopsy-metastatic colorectal adenocarcinoma  6. MSI stable; mismatch repair protein normal  7. 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 Cycle 1 FOLFOX 02/02/2016  9. Cycle 2 FOLFOX 02/15/2016  10. Cycle 3 FOLFOX 02/29/2016  11. Cycle 4 FOLFOX 03/14/2016  12. Cycle 5 FOLFOX 03/28/2016  13. Cycle 6 FOLFOX 04/11/2016 (oxaliplatin held secondary to thrombocytopenia)  14. 04/21/2016 restaging CTs-difficulty evaluating liver lesions due to hepatic steatosis. Stable right adrenal nodule. No adenopathy or local recurrence near the rectosigmoid anastomotic site.  15. Cycle 7 FOLFOX 04/25/2016  16. MRI liver 05/02/2016-partial improvement in hepatic metastases  17. Cycle 8 FOLFOX 05/10/2016  18. Cycle 9 FOLFOX 05/23/2016 (oxaliplatin held due to thrombocytopenia)  19. Cycle 10 FOLFOX 06/06/2016 (oxaliplatin dose reduced due to thrombocytopenia)  20. Cycle 11 FOLFOX 06/27/2016 (oxaliplatin held due to neuropathy)  21. Cycle 12 FOLFOX 07/11/2016 (oxaliplatin held) 22. Initiation of maintenance Xeloda 7 days on/7 days off 07/27/2016 23. MRI liver 11/18/2016-decrease in hepatic metastatic disease. No new or progressive disease identified within the abdomen. 24. Continuation of Xeloda 7 days on/7 days off 25. MRI liver 04/27/2017-previous liver lesions not identified, no new lesions, no lymphadenopathy 26. Xeloda continued 7 days on/7 days off 27.  MRI liver 12/04/2017 - no evidence of metastatic disease, hepatic steatosis 28. Xeloda continued 7 days on/7 days off 29. MRI liver 07/15/2018- no evidence of metastatic disease. Stable severe hepatic steatosis. 30.  Xeloda continued 7 days on/7 days off 31. MRI liver 03/16/2019-hepatic steatosis, no liver mass, focal area of intrahepatic biliary dilatation in segments 2 and 3 of the left lobe-increased 32. Load to continued 7 days on/7 days off  2. Rectal bleeding and constipation secondary to #1  3. History of peripheral vascular disease, status post left lower extremity vascular bypass surgery in April 2017  4. History of nephrolithiasis  5. History of Graves' disease treated with radioactive iodine  6. Anxiety/depression  7. Hypertension  8. Hospitalization 01/19/2016 with wound dehiscence status post secondary suture closure of abdominal wall  9. 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 Hyperglycemia 06/20/2016-diagnosed with diabetes, maintained on insulin  11.  Positive COVID test 12/13/2018    Disposition: Mr. Mahrt appears unchanged.  There is no clinical or radiologic evidence of disease progression.  The CEA is just above the upper limit of the normal range today.  The plan is to continue Xeloda at the current dose.  We will schedule restaging CTs for a persistent rise in the CEA.  Mr. Scheiber will return for an office visit in 6 weeks.  Betsy Coder, MD  03/28/2019  3:50 PM

## 2019-03-29 ENCOUNTER — Telehealth: Payer: Self-pay | Admitting: Oncology

## 2019-03-29 NOTE — Telephone Encounter (Signed)
Scheduled per los. Called and left msg. Mailed printout  °

## 2019-04-01 ENCOUNTER — Other Ambulatory Visit: Payer: Self-pay | Admitting: Oncology

## 2019-04-01 DIAGNOSIS — C187 Malignant neoplasm of sigmoid colon: Secondary | ICD-10-CM

## 2019-04-03 MED FILL — TAMSULOSIN HCL 0.4 MG CAP: 0.4 | 30 days supply | Qty: 30 | Fill #2

## 2019-04-03 MED FILL — CAPECITABINE 500 MG TABS: 500 | 28 days supply | Qty: 112 | Fill #0

## 2019-04-04 ENCOUNTER — Other Ambulatory Visit: Payer: Self-pay

## 2019-04-04 DIAGNOSIS — I779 Disorder of arteries and arterioles, unspecified: Secondary | ICD-10-CM

## 2019-04-04 DIAGNOSIS — Z95828 Presence of other vascular implants and grafts: Secondary | ICD-10-CM

## 2019-04-08 ENCOUNTER — Encounter (HOSPITAL_COMMUNITY): Payer: Medicaid Other

## 2019-04-08 ENCOUNTER — Ambulatory Visit: Payer: Medicaid Other | Admitting: Family

## 2019-04-08 ENCOUNTER — Other Ambulatory Visit (HOSPITAL_COMMUNITY): Payer: Medicaid Other

## 2019-04-16 ENCOUNTER — Ambulatory Visit: Payer: Medicaid Other | Admitting: Cardiovascular Disease

## 2019-04-16 ENCOUNTER — Other Ambulatory Visit: Payer: Self-pay

## 2019-04-16 VITALS — BP 144/98 | HR 94 | Temp 97.0°F | Ht 70.0 in | Wt 255.0 lb

## 2019-04-16 DIAGNOSIS — E782 Mixed hyperlipidemia: Secondary | ICD-10-CM | POA: Diagnosis not present

## 2019-04-16 DIAGNOSIS — I10 Essential (primary) hypertension: Secondary | ICD-10-CM | POA: Diagnosis not present

## 2019-04-16 DIAGNOSIS — I739 Peripheral vascular disease, unspecified: Secondary | ICD-10-CM | POA: Diagnosis not present

## 2019-04-16 LAB — LIPID PANEL
Chol/HDL Ratio: 3.9 ratio (ref 0.0–5.0)
Cholesterol, Total: 139 mg/dL (ref 100–199)
HDL: 36 mg/dL — ABNORMAL LOW (ref >39)
LDL Chol Calc (NIH): 68 mg/dL (ref 0–99)
Triglycerides: 210 mg/dL — ABNORMAL HIGH (ref 0–149)
VLDL Cholesterol Cal: 35 mg/dL (ref 5–40)
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
## 2019-04-16 NOTE — Patient Instructions (Signed)
Medication Instructions:  Your physician recommends that you continue on your current medications as directed. Please refer to the Current Medication list given to you today.  If you need a refill on your cardiac medications before your next appointment, please call your pharmacy.   Lab work: Your physician recommends that you return for a FASTING lipid profile TODAY  If you have labs (blood work) drawn today and your tests are completely normal, you will receive your results only by: Marland Kitchen MyChart Message (if you have MyChart) OR . A paper copy in the mail If you have any lab test that is abnormal or we need to change your treatment, we will call you to review the results.  Testing/Procedures: NONE  Follow-Up: At [facility], you and your health needs are our priority.  As part of our continuing mission to provide you with exceptional heart care, we have created designated Provider Care Teams.  These Care Teams include your primary Cardiologist (physician) and Advanced Practice Providers (APPs -  Physician Assistants and Nurse Practitioners) who all work together to provide you with the care you need, when you need it. You will need a follow up appointment in 12 months with Dr. Fletcher Anon.  Please call our office 2 months in advance to schedule this appointment.

## 2019-04-17 ENCOUNTER — Other Ambulatory Visit: Payer: Self-pay | Admitting: Cardiovascular Disease

## 2019-04-18 NOTE — Telephone Encounter (Signed)
Refill Request.  

## 2019-04-23 ENCOUNTER — Other Ambulatory Visit: Payer: Self-pay | Admitting: Oncology

## 2019-04-23 DIAGNOSIS — C187 Malignant neoplasm of sigmoid colon: Secondary | ICD-10-CM

## 2019-04-25 ENCOUNTER — Encounter: Payer: Self-pay | Admitting: *Deleted

## 2019-04-30 MED FILL — CAPECITABINE 500 MG TABS: 500 | 28 days supply | Qty: 112 | Fill #0

## 2019-05-07 ENCOUNTER — Inpatient Hospital Stay: Payer: Medicaid Other

## 2019-05-07 ENCOUNTER — Other Ambulatory Visit: Payer: Self-pay

## 2019-05-07 ENCOUNTER — Inpatient Hospital Stay (HOSPITAL_BASED_OUTPATIENT_CLINIC_OR_DEPARTMENT_OTHER): Payer: Medicaid Other | Admitting: Nurse Practitioner

## 2019-05-07 ENCOUNTER — Encounter: Payer: Self-pay | Admitting: Nurse Practitioner

## 2019-05-07 ENCOUNTER — Inpatient Hospital Stay: Payer: Medicaid Other | Attending: Oncology

## 2019-05-07 VITALS — BP 142/92 | HR 100 | Temp 97.8°F | Resp 20 | Ht 70.0 in | Wt 255.9 lb

## 2019-05-07 DIAGNOSIS — C787 Secondary malignant neoplasm of liver and intrahepatic bile duct: Secondary | ICD-10-CM | POA: Diagnosis present

## 2019-05-07 DIAGNOSIS — I10 Essential (primary) hypertension: Secondary | ICD-10-CM | POA: Diagnosis not present

## 2019-05-07 DIAGNOSIS — C187 Malignant neoplasm of sigmoid colon: Secondary | ICD-10-CM | POA: Diagnosis present

## 2019-05-07 DIAGNOSIS — Z95828 Presence of other vascular implants and grafts: Secondary | ICD-10-CM

## 2019-05-07 DIAGNOSIS — Z8619 Personal history of other infectious and parasitic diseases: Secondary | ICD-10-CM | POA: Diagnosis not present

## 2019-05-07 DIAGNOSIS — Z794 Long term (current) use of insulin: Secondary | ICD-10-CM | POA: Insufficient documentation

## 2019-05-07 DIAGNOSIS — C189 Malignant neoplasm of colon, unspecified: Secondary | ICD-10-CM

## 2019-05-07 DIAGNOSIS — E119 Type 2 diabetes mellitus without complications: Secondary | ICD-10-CM | POA: Insufficient documentation

## 2019-05-07 LAB — CBC WITH DIFFERENTIAL (CANCER CENTER ONLY)
Abs Immature Granulocytes: 0.02 10*3/uL (ref 0.00–0.07)
Basophils Absolute: 0 10*3/uL (ref 0.0–0.1)
Basophils Relative: 0 %
Eosinophils Absolute: 0.3 10*3/uL (ref 0.0–0.5)
Eosinophils Relative: 4 %
HCT: 41.5 % (ref 39.0–52.0)
Hemoglobin: 13.6 g/dL (ref 13.0–17.0)
Immature Granulocytes: 0 %
Lymphocytes Relative: 33 %
Lymphs Abs: 2.8 10*3/uL (ref 0.7–4.0)
MCH: 29.6 pg (ref 26.0–34.0)
MCHC: 32.8 g/dL (ref 30.0–36.0)
MCV: 90.4 fL (ref 80.0–100.0)
Monocytes Absolute: 0.7 10*3/uL (ref 0.1–1.0)
Monocytes Relative: 8 %
Neutro Abs: 4.6 10*3/uL (ref 1.7–7.7)
Neutrophils Relative %: 55 %
Platelet Count: 143 10*3/uL — ABNORMAL LOW (ref 150–400)
RBC: 4.59 MIL/uL (ref 4.22–5.81)
RDW: 16.5 % — ABNORMAL HIGH (ref 11.5–15.5)
WBC Count: 8.4 10*3/uL (ref 4.0–10.5)
nRBC: 0 % (ref 0.0–0.2)

## 2019-05-07 LAB — CMP (CANCER CENTER ONLY)
ALT: 74 U/L — ABNORMAL HIGH (ref 0–44)
AST: 56 U/L — ABNORMAL HIGH (ref 15–41)
Albumin: 3.9 g/dL (ref 3.5–5.0)
Alkaline Phosphatase: 96 U/L (ref 38–126)
Anion gap: 13 (ref 5–15)
BUN: 9 mg/dL (ref 6–20)
CO2: 26 mmol/L (ref 22–32)
Calcium: 8.5 mg/dL — ABNORMAL LOW (ref 8.9–10.3)
Chloride: 100 mmol/L (ref 98–111)
Creatinine: 1.14 mg/dL (ref 0.61–1.24)
GFR, Est AFR Am: >60 mL/min (ref >60)
GFR, Estimated: >60 mL/min (ref >60)
Glucose, Bld: 215 mg/dL — ABNORMAL HIGH (ref 70–99)
Potassium: 3.7 mmol/L (ref 3.5–5.1)
Sodium: 139 mmol/L (ref 135–145)
Total Bilirubin: 0.4 mg/dL (ref 0.3–1.2)
Total Protein: 7.5 g/dL (ref 6.5–8.1)

## 2019-05-07 LAB — CEA (IN HOUSE-CHCC): CEA (CHCC-In House): 11.91 ng/mL — ABNORMAL HIGH (ref 0.00–5.00)

## 2019-05-07 MED ORDER — HEPARIN SOD (PORK) LOCK FLUSH 100 UNIT/ML IV SOLN
500.0000 [IU] | Freq: Once | INTRAVENOUS | Status: AC | PRN
  Administered 2019-05-07: 500 [IU] via INTRAVENOUS
  Filled 2019-05-07: qty 5

## 2019-05-07 MED ORDER — SODIUM CHLORIDE 0.9% FLUSH
10.0000 mL | INTRAVENOUS | Status: DC | PRN
  Administered 2019-05-07: 10 mL via INTRAVENOUS
  Filled 2019-05-07: qty 10

## 2019-05-07 NOTE — Progress Notes (Signed)
[place] OFFICE PROGRESS NOTE   Diagnosis: Colon cancer  INTERVAL HISTORY:   Jeff [name] returns as scheduled.  He continues Xeloda.  He denies nausea/vomiting.  No mouth sores.  No diarrhea.  No hand or foot pain or redness.  He denies abdominal pain.  No bleeding.  Objective:  Vital signs in last 24 hours:  Blood pressure (!) 142/92, pulse 100, temperature 97.8 F (36.6 C), temperature source Temporal, resp. rate 20, height '5\' 10"'$  (1.778 m), weight 255 lb 14.4 oz (116.1 kg), SpO2 100 %.    HEENT: Neck without mass. Lymphatics: No palpable cervical, supraclavicular or axillary lymph nodes. Resp: Lungs clear bilaterally. Cardio: Regular rate and rhythm. GI: Abdomen soft and nontender.  No hepatomegaly. Vascular: No leg edema. Skin: Palms without erythema. Port-A-Cath without erythema.   Lab Results:  Lab Results  Component Value Date   WBC 8.4 05/07/2019   HGB 13.6 05/07/2019   HCT 41.5 05/07/2019   MCV 90.4 05/07/2019   PLT 143 (L) 05/07/2019   NEUTROABS 4.6 05/07/2019    Imaging:  No results found.  Medications: I have reviewed the patient's current medications.  Assessment/Plan: 1. 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 CTschest, abdomen, and pelvis on 12/11/2015-indeterminate tiny pulmonary nodules, multiple liver metastases, small nodes in the sigmoid mesocolon  3. Laparoscopic sigmoid colectomy, liver biopsy, Port-A-Cath placement 01/14/2016  4. Pathology sigmoid colon resection-colonic adenocarcinoma, 5.3 cm extending into pericolonic connective tissue, positive lymph-vascular invasion, positive perineural invasion, negative margins, metastatic carcinoma in9 of 28 lymph nodes  5. Pathology liver biopsy-metastatic colorectal adenocarcinoma  6. MSI stable; mismatch repair protein normal  7. 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 Cycle 1 FOLFOX 02/02/2016  9. Cycle 2 FOLFOX 02/15/2016  10. Cycle 3 FOLFOX 02/29/2016  11. Cycle 4 FOLFOX 03/14/2016  12. Cycle 5 FOLFOX 03/28/2016  13. Cycle 6 FOLFOX 04/11/2016 (oxaliplatin held secondary to thrombocytopenia)  14. 04/21/2016 restaging CTs-difficulty evaluating liver lesions due to hepatic steatosis. Stable right adrenal nodule. No adenopathy or local recurrence near the rectosigmoid anastomotic site.  15. Cycle 7 FOLFOX 04/25/2016  16. MRI liver 05/02/2016-partial improvement in hepatic metastases  17. Cycle 8 FOLFOX 05/10/2016  18. Cycle 9 FOLFOX 05/23/2016 (oxaliplatin held due to thrombocytopenia)  19. Cycle 10 FOLFOX 06/06/2016 (oxaliplatin dose reduced due to thrombocytopenia)  20. Cycle 11 FOLFOX 06/27/2016 (oxaliplatin held due to neuropathy)  21. Cycle 12 FOLFOX 07/11/2016 (oxaliplatin held) 22. Initiation of maintenance Xeloda 7 days on/7 days off 07/27/2016 23. MRI liver 11/18/2016-decrease in hepatic metastatic disease. No new or progressive disease identified within the abdomen. 24. Continuation of Xeloda 7 days on/7 days off 25. MRI liver 04/27/2017-previous liver lesions not identified, no new lesions, no lymphadenopathy 26. Xeloda continued 7 days on/7 days off 27. MRI liver 12/04/2017 -no evidence of metastatic disease, hepatic steatosis 28. Xeloda continued 7 days on/7 days off 29. MRI liver 07/15/2018-no evidence of metastatic disease. Stable severe hepatic steatosis. 30. Xeloda continued 7 days on/7 days off 31. MRI liver 03/16/2019-hepatic steatosis, no liver mass, focal area of intrahepatic biliary dilatation in segments 2 and 3 of the left lobe-increased 32. Xeloda continued 7 days on/7 days off  2. Rectal bleeding and constipation secondary to #1  3. History of peripheral vascular disease, status post left lower extremity vascular bypass surgery in April 2017  4. History of nephrolithiasis  5. History of Graves' disease  treated with  radioactive iodine  6. Anxiety/depression  7. Hypertension  8. Hospitalization 01/19/2016 with wound dehiscence status post secondary suture closure of abdominal wall  9. 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 Hyperglycemia 06/20/2016-diagnosed with diabetes, maintained on insulin  11.Positive COVID test 12/13/2018    Disposition: [name] appears well.  There is no clinical evidence of disease progression.  He will continue Xeloda 7 days on/7 days off.  We will follow-up on the CEA from today.  We reviewed the CBC and chemistry panel from today.  He will return for lab, Port-A-Cath flush and follow-up in 8 weeks.  He will contact the office in the interim with any problems.    Ned Card ANP/GNP-BC   05/07/2019  10:06 AM

## 2019-05-09 ENCOUNTER — Telehealth: Payer: Self-pay | Admitting: Nurse Practitioner

## 2019-05-09 NOTE — Telephone Encounter (Signed)
Scheduled per los. Called and left msg. Mailed printotut  

## 2019-05-21 ENCOUNTER — Telehealth: Payer: Self-pay | Admitting: *Deleted

## 2019-05-21 NOTE — Telephone Encounter (Signed)
Per Ned Card, NP, called pt wife and notified her that pt's CEA was higher on 05/07/19 and a repeat CEA will be done on next visit. If higher at the time of next visit we will refer him for scans. Pt wife verbalized understanding.

## 2019-05-21 NOTE — Telephone Encounter (Signed)
-----   Message from Owens Shark, NP sent at 05/20/2019  4:17 PM EST ----- Please let him know the CEA was higher on 05/07/2019.  We will repeat it at the time of his next visit.  If higher again we will refer him for scans.

## 2019-05-26 ENCOUNTER — Other Ambulatory Visit: Payer: Self-pay | Admitting: Oncology

## 2019-05-26 DIAGNOSIS — C787 Secondary malignant neoplasm of liver and intrahepatic bile duct: Secondary | ICD-10-CM

## 2019-05-26 DIAGNOSIS — C189 Malignant neoplasm of colon, unspecified: Secondary | ICD-10-CM

## 2019-05-26 DIAGNOSIS — C187 Malignant neoplasm of sigmoid colon: Secondary | ICD-10-CM

## 2019-05-27 ENCOUNTER — Other Ambulatory Visit: Payer: Self-pay | Admitting: Oncology

## 2019-05-27 DIAGNOSIS — C187 Malignant neoplasm of sigmoid colon: Secondary | ICD-10-CM

## 2019-05-29 MED FILL — CAPECITABINE 500 MG TABLET: 500 | 28 days supply | Qty: 112 | Fill #0

## 2019-05-29 MED FILL — TAMSULOSIN HCL 0.4 MG CAP: 0.4 | 30 days supply | Qty: 30 | Fill #3

## 2019-06-04 MED FILL — NOVOLOG FLEXPEN SYRINGE: 100 | 50 days supply | Qty: 15 | Fill #0

## 2019-06-05 MED FILL — UNIFINE PENTIPS 31GX3/16: 31G X 5 MM | 50 days supply | Qty: 100 | Fill #3

## 2019-06-05 MED FILL — UNIFINE PENTIPS 31GX3/16": 31G X 5 MM | 50 days supply | Qty: 100 | Fill #3

## 2019-06-06 MED FILL — clonazePAM 1 MG TABS: 1 | 30 days supply | Qty: 60 | Fill #0

## 2019-06-19 MED FILL — FARXIGA 5 MG TABLET: 5 | 30 days supply | Qty: 30 | Fill #0

## 2019-06-21 ENCOUNTER — Other Ambulatory Visit: Payer: Self-pay | Admitting: Oncology

## 2019-06-21 DIAGNOSIS — C187 Malignant neoplasm of sigmoid colon: Secondary | ICD-10-CM

## 2019-06-25 MED FILL — CAPECITABINE 500 MG TABLET: 500 | 28 days supply | Qty: 112 | Fill #0

## 2019-07-02 ENCOUNTER — Telehealth: Payer: Self-pay | Admitting: *Deleted

## 2019-07-02 ENCOUNTER — Inpatient Hospital Stay: Payer: Medicaid Other

## 2019-07-02 ENCOUNTER — Other Ambulatory Visit: Payer: Self-pay

## 2019-07-02 ENCOUNTER — Inpatient Hospital Stay: Payer: Medicaid Other | Attending: Oncology | Admitting: Oncology

## 2019-07-02 VITALS — BP 146/90 | HR 127 | Temp 97.8°F | Resp 18 | Ht 70.0 in | Wt 240.4 lb

## 2019-07-02 DIAGNOSIS — L818 Other specified disorders of pigmentation: Secondary | ICD-10-CM | POA: Insufficient documentation

## 2019-07-02 DIAGNOSIS — Z794 Long term (current) use of insulin: Secondary | ICD-10-CM | POA: Insufficient documentation

## 2019-07-02 DIAGNOSIS — R634 Abnormal weight loss: Secondary | ICD-10-CM | POA: Insufficient documentation

## 2019-07-02 DIAGNOSIS — C187 Malignant neoplasm of sigmoid colon: Secondary | ICD-10-CM | POA: Insufficient documentation

## 2019-07-02 DIAGNOSIS — I10 Essential (primary) hypertension: Secondary | ICD-10-CM | POA: Diagnosis not present

## 2019-07-02 DIAGNOSIS — E1165 Type 2 diabetes mellitus with hyperglycemia: Secondary | ICD-10-CM | POA: Insufficient documentation

## 2019-07-02 DIAGNOSIS — C787 Secondary malignant neoplasm of liver and intrahepatic bile duct: Secondary | ICD-10-CM | POA: Diagnosis present

## 2019-07-02 DIAGNOSIS — C189 Malignant neoplasm of colon, unspecified: Secondary | ICD-10-CM

## 2019-07-02 DIAGNOSIS — Z95828 Presence of other vascular implants and grafts: Secondary | ICD-10-CM

## 2019-07-02 LAB — CMP (CANCER CENTER ONLY)
ALT: 59 U/L — ABNORMAL HIGH (ref 0–44)
AST: 40 U/L (ref 15–41)
Albumin: 3.9 g/dL (ref 3.5–5.0)
Alkaline Phosphatase: 110 U/L (ref 38–126)
Anion gap: 19 — ABNORMAL HIGH (ref 5–15)
BUN: 10 mg/dL (ref 6–20)
CO2: 19 mmol/L — ABNORMAL LOW (ref 22–32)
Calcium: 8.9 mg/dL (ref 8.9–10.3)
Chloride: 98 mmol/L (ref 98–111)
Creatinine: 1.46 mg/dL — ABNORMAL HIGH (ref 0.61–1.24)
GFR, Est AFR Am: >60 mL/min (ref >60)
GFR, Estimated: 55 mL/min — ABNORMAL LOW (ref >60)
Glucose, Bld: 486 mg/dL — ABNORMAL HIGH (ref 70–99)
Potassium: 3.9 mmol/L (ref 3.5–5.1)
Sodium: 136 mmol/L (ref 135–145)
Total Bilirubin: 0.5 mg/dL (ref 0.3–1.2)
Total Protein: 7.7 g/dL (ref 6.5–8.1)

## 2019-07-02 LAB — CBC WITH DIFFERENTIAL (CANCER CENTER ONLY)
Abs Immature Granulocytes: 0.02 10*3/uL (ref 0.00–0.07)
Basophils Absolute: 0 10*3/uL (ref 0.0–0.1)
Basophils Relative: 0 %
Eosinophils Absolute: 0.2 10*3/uL (ref 0.0–0.5)
Eosinophils Relative: 3 %
HCT: 42 % (ref 39.0–52.0)
Hemoglobin: 14 g/dL (ref 13.0–17.0)
Immature Granulocytes: 0 %
Lymphocytes Relative: 39 %
Lymphs Abs: 2.7 10*3/uL (ref 0.7–4.0)
MCH: 30.3 pg (ref 26.0–34.0)
MCHC: 33.3 g/dL (ref 30.0–36.0)
MCV: 90.9 fL (ref 80.0–100.0)
Monocytes Absolute: 0.5 10*3/uL (ref 0.1–1.0)
Monocytes Relative: 7 %
Neutro Abs: 3.5 10*3/uL (ref 1.7–7.7)
Neutrophils Relative %: 51 %
Platelet Count: 142 10*3/uL — ABNORMAL LOW (ref 150–400)
RBC: 4.62 MIL/uL (ref 4.22–5.81)
RDW: 15.9 % — ABNORMAL HIGH (ref 11.5–15.5)
WBC Count: 6.8 10*3/uL (ref 4.0–10.5)
nRBC: 0 % (ref 0.0–0.2)

## 2019-07-02 LAB — CEA (IN HOUSE-CHCC): CEA (CHCC-In House): 41.35 ng/mL — ABNORMAL HIGH (ref 0.00–5.00)

## 2019-07-02 MED ORDER — SODIUM CHLORIDE 0.9% FLUSH
10.0000 mL | INTRAVENOUS | Status: DC | PRN
  Administered 2019-07-02: 10 mL via INTRAVENOUS
  Filled 2019-07-02: qty 10

## 2019-07-02 MED ORDER — HEPARIN SOD (PORK) LOCK FLUSH 100 UNIT/ML IV SOLN
500.0000 [IU] | Freq: Once | INTRAVENOUS | Status: AC | PRN
  Administered 2019-07-02: 09:00:00 500 [IU] via INTRAVENOUS
  Filled 2019-07-02: qty 5

## 2019-07-02 NOTE — Telephone Encounter (Signed)
Called PCP. They are going to call Mr Benites and see him today. Re: elevated glucose and creatinine above baseline.

## 2019-07-02 NOTE — Patient Instructions (Signed)

## 2019-07-02 NOTE — Progress Notes (Signed)
[place] OFFICE PROGRESS NOTE   Diagnosis: Colon cancer  INTERVAL HISTORY:   Mr. Lefeber returns as scheduled.  He continues Xeloda.  No mouth sores.  Occasional diarrhea.  Hyperpigmentation of the hands.  He reports a good appetite.  He relates weight loss to diabetes.  He reports his blood sugar has been high for the past several months, in the 250-300 range.  He reports he was taken off of insulin and has resumed insulin at a lower dose.  Objective:  Vital signs in last 24 hours:  Blood pressure (!) 146/90, pulse (!) 127, temperature 97.8 F (36.6 C), resp. rate 18, height '5\' 10"'$  (1.778 m), weight 240 lb 6.4 oz (109 kg), SpO2 100 %.  Repeat manual pulse-104    HEENT: No thrush or ulcers Cardio: Regular rate and rhythm GI: No hepatomegaly, nontender Vascular: No leg edema  Skin: Palms without erythema, mild pigmentation and skin thickening of the hands  Portacath/PICC-without erythema  Lab Results:  Lab Results  Component Value Date   WBC 6.8 07/02/2019   HGB 14.0 07/02/2019   HCT 42.0 07/02/2019   MCV 90.9 07/02/2019   PLT 142 (L) 07/02/2019   NEUTROABS 3.5 07/02/2019    CMP  Lab Results  Component Value Date   NA 139 05/07/2019   K 3.7 05/07/2019   CL 100 05/07/2019   CO2 26 05/07/2019   GLUCOSE 215 (H) 05/07/2019   BUN 9 05/07/2019   CREATININE 1.14 05/07/2019   CALCIUM 8.5 (L) 05/07/2019   PROT 7.5 05/07/2019   ALBUMIN 3.9 05/07/2019   AST 56 (H) 05/07/2019   ALT 74 (H) 05/07/2019   ALKPHOS 96 05/07/2019   BILITOT 0.4 05/07/2019   GFRNONAA >60 05/07/2019   GFRAA >60 05/07/2019    Lab Results  Component Value Date   CEA1 11.91 (H) 05/07/2019     Medications: I have reviewed the patient's current medications.   Assessment/Plan: 1. 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 CTschest, abdomen, and pelvis on 12/11/2015-indeterminate tiny  pulmonary nodules, multiple liver metastases, small nodes in the sigmoid mesocolon  3. Laparoscopic sigmoid colectomy, liver biopsy, Port-A-Cath placement 01/14/2016  4. Pathology sigmoid colon resection-colonic adenocarcinoma, 5.3 cm extending into pericolonic connective tissue, positive lymph-vascular invasion, positive perineural invasion, negative margins, metastatic carcinoma in9 of 28 lymph nodes  5. Pathology liver biopsy-metastatic colorectal adenocarcinoma  6. MSI stable; mismatch repair protein normal  7. 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 Cycle 1 FOLFOX 02/02/2016  9. Cycle 2 FOLFOX 02/15/2016  10. Cycle 3 FOLFOX 02/29/2016  11. Cycle 4 FOLFOX 03/14/2016  12. Cycle 5 FOLFOX 03/28/2016  13. Cycle 6 FOLFOX 04/11/2016 (oxaliplatin held secondary to thrombocytopenia)  14. 04/21/2016 restaging CTs-difficulty evaluating liver lesions due to hepatic steatosis. Stable right adrenal nodule. No adenopathy or local recurrence near the rectosigmoid anastomotic site.  15. Cycle 7 FOLFOX 04/25/2016  16. MRI liver 05/02/2016-partial improvement in hepatic metastases  17. Cycle 8 FOLFOX 05/10/2016  18. Cycle 9 FOLFOX 05/23/2016 (oxaliplatin held due to thrombocytopenia)  19. Cycle 10 FOLFOX 06/06/2016 (oxaliplatin dose reduced due to thrombocytopenia)  20. Cycle 11 FOLFOX 06/27/2016 (oxaliplatin held due to neuropathy)  21. Cycle 12 FOLFOX 07/11/2016 (oxaliplatin held) 22. Initiation of maintenance Xeloda 7 days on/7 days off 07/27/2016 23. MRI liver 11/18/2016-decrease in hepatic metastatic disease. No new or progressive disease identified within the abdomen. 24. Continuation of Xeloda 7 days on/7 days  off 25. MRI liver 04/27/2017-previous liver lesions not identified, no new lesions, no lymphadenopathy 26. Xeloda continued 7 days on/7 days off 27. MRI liver 12/04/2017 -no evidence of metastatic disease, hepatic steatosis 28. Xeloda continued 7 days on/7 days off 29. MRI  liver 07/15/2018-no evidence of metastatic disease. Stable severe hepatic steatosis. 30. Xeloda continued 7 days on/7 days off 31. MRI liver 03/16/2019-hepatic steatosis, no liver mass, focal area of intrahepatic biliary dilatation in segments 2 and 3 of the left lobe-increased 32. Xeloda continued 7 days on/7 days off  2. Rectal bleeding and constipation secondary to #1  3. History of peripheral vascular disease, status post left lower extremity vascular bypass surgery in April 2017  4. History of nephrolithiasis  5. History of Graves' disease treated with radioactive iodine  6. Anxiety/depression  7. Hypertension  8. Hospitalization 01/19/2016 with wound dehiscence status post secondary suture closure of abdominal wall  9. 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 Hyperglycemia 06/20/2016-diagnosed with diabetes, maintained on insulin  11.Positive COVID test 12/13/2018    Disposition: Mr. Wyss appears unchanged, though he has lost a significant amount of weight over the past few months.  He relates this to hyperglycemia.  I recommended he follow-up with his diabetes provider as soon as possible.  There is no clinical evidence for progression of the colon cancer.  We will follow up on the CEA from today.  He will be referred for a restaging MRI if the CEA is higher.  Mr. Coutts will return for an office visit in 1 month.  Betsy Coder, MD  07/02/2019  9:13 AM

## 2019-07-03 ENCOUNTER — Telehealth: Payer: Self-pay | Admitting: *Deleted

## 2019-07-03 MED FILL — LEVEMIR 100 UNITS/ML VIAL: 100 | 42 days supply | Qty: 10 | Fill #0

## 2019-07-03 NOTE — Telephone Encounter (Signed)
Left message for Jeff [name] that CEA is higher, but could also be impacted by his dehydration and hyperglycemia. Will have him return in 2-3 weeks for labs/flush/OV and regroup. Most likely will have MRI abdomen w/without contrast after the next visit. Call if you do not hear from scheduling in next week.

## 2019-07-03 NOTE — Telephone Encounter (Signed)
-----   Message from Ladell Pier, MD sent at 07/03/2019  1:53 PM EST ----- Please call patient, CEA is higher, may be in part related to dehydration with the marked hyperglycemia He will need an MRI abdomen with/without contrast after the next office visit here  Should be scheduled for an office visit, CEA, and CMP next 2-3 weeks

## 2019-07-04 ENCOUNTER — Telehealth: Payer: Self-pay | Admitting: Oncology

## 2019-07-04 MED FILL — TAMSULOSIN HCL 0.4 MG CAP: 0.4 | 30 days supply | Qty: 30 | Fill #4

## 2019-07-04 MED FILL — TRUEPLUS SYR 1ML 30GX5/16": 30G X 5/16" | 90 days supply | Qty: 100 | Fill #0

## 2019-07-04 MED FILL — TRUEPLUS SYR 1ML 30GX5/16: 30G X 5/16" | 90 days supply | Qty: 100 | Fill #0

## 2019-07-04 NOTE — Telephone Encounter (Signed)
Scheduled per los. Called and left msg. Mailed printout  °

## 2019-07-17 ENCOUNTER — Inpatient Hospital Stay: Payer: Medicaid Other | Attending: Oncology

## 2019-07-17 ENCOUNTER — Inpatient Hospital Stay (HOSPITAL_BASED_OUTPATIENT_CLINIC_OR_DEPARTMENT_OTHER): Payer: Medicaid Other | Admitting: Oncology

## 2019-07-17 ENCOUNTER — Telehealth: Payer: Self-pay | Admitting: *Deleted

## 2019-07-17 ENCOUNTER — Other Ambulatory Visit: Payer: Self-pay

## 2019-07-17 ENCOUNTER — Encounter: Payer: Self-pay | Admitting: Oncology

## 2019-07-17 ENCOUNTER — Inpatient Hospital Stay: Payer: Medicaid Other

## 2019-07-17 VITALS — BP 139/95 | HR 110 | Temp 98.2°F | Resp 20 | Ht 70.0 in | Wt 232.1 lb

## 2019-07-17 DIAGNOSIS — C189 Malignant neoplasm of colon, unspecified: Secondary | ICD-10-CM

## 2019-07-17 DIAGNOSIS — C787 Secondary malignant neoplasm of liver and intrahepatic bile duct: Secondary | ICD-10-CM | POA: Diagnosis present

## 2019-07-17 DIAGNOSIS — E1165 Type 2 diabetes mellitus with hyperglycemia: Secondary | ICD-10-CM | POA: Diagnosis not present

## 2019-07-17 DIAGNOSIS — C187 Malignant neoplasm of sigmoid colon: Secondary | ICD-10-CM | POA: Insufficient documentation

## 2019-07-17 DIAGNOSIS — Z794 Long term (current) use of insulin: Secondary | ICD-10-CM | POA: Diagnosis not present

## 2019-07-17 DIAGNOSIS — Z8616 Personal history of covid-19: Secondary | ICD-10-CM | POA: Diagnosis not present

## 2019-07-17 DIAGNOSIS — I10 Essential (primary) hypertension: Secondary | ICD-10-CM | POA: Insufficient documentation

## 2019-07-17 DIAGNOSIS — Z95828 Presence of other vascular implants and grafts: Secondary | ICD-10-CM

## 2019-07-17 LAB — CMP (CANCER CENTER ONLY)
ALT: 50 U/L — ABNORMAL HIGH (ref 0–44)
AST: 44 U/L — ABNORMAL HIGH (ref 15–41)
Albumin: 4 g/dL (ref 3.5–5.0)
Alkaline Phosphatase: 115 U/L (ref 38–126)
Anion gap: 17 — ABNORMAL HIGH (ref 5–15)
BUN: 11 mg/dL (ref 6–20)
CO2: 19 mmol/L — ABNORMAL LOW (ref 22–32)
Calcium: 8.8 mg/dL — ABNORMAL LOW (ref 8.9–10.3)
Chloride: 102 mmol/L (ref 98–111)
Creatinine: 1.16 mg/dL (ref 0.61–1.24)
GFR, Est AFR Am: >60 mL/min (ref >60)
GFR, Estimated: >60 mL/min (ref >60)
Glucose, Bld: 171 mg/dL — ABNORMAL HIGH (ref 70–99)
Potassium: 4.1 mmol/L (ref 3.5–5.1)
Sodium: 138 mmol/L (ref 135–145)
Total Bilirubin: 0.6 mg/dL (ref 0.3–1.2)
Total Protein: 8.1 g/dL (ref 6.5–8.1)

## 2019-07-17 LAB — CBC WITH DIFFERENTIAL (CANCER CENTER ONLY)
Abs Immature Granulocytes: 0.02 10*3/uL (ref 0.00–0.07)
Basophils Absolute: 0 10*3/uL (ref 0.0–0.1)
Basophils Relative: 0 %
Eosinophils Absolute: 0.2 10*3/uL (ref 0.0–0.5)
Eosinophils Relative: 3 %
HCT: 43.5 % (ref 39.0–52.0)
Hemoglobin: 14.4 g/dL (ref 13.0–17.0)
Immature Granulocytes: 0 %
Lymphocytes Relative: 38 %
Lymphs Abs: 3.2 10*3/uL (ref 0.7–4.0)
MCH: 29.9 pg (ref 26.0–34.0)
MCHC: 33.1 g/dL (ref 30.0–36.0)
MCV: 90.4 fL (ref 80.0–100.0)
Monocytes Absolute: 0.7 10*3/uL (ref 0.1–1.0)
Monocytes Relative: 8 %
Neutro Abs: 4.3 10*3/uL (ref 1.7–7.7)
Neutrophils Relative %: 51 %
Platelet Count: 158 10*3/uL (ref 150–400)
RBC: 4.81 MIL/uL (ref 4.22–5.81)
RDW: 16.1 % — ABNORMAL HIGH (ref 11.5–15.5)
WBC Count: 8.4 10*3/uL (ref 4.0–10.5)
nRBC: 0 % (ref 0.0–0.2)

## 2019-07-17 LAB — CEA (IN HOUSE-CHCC): CEA (CHCC-In House): 29.09 ng/mL — ABNORMAL HIGH (ref 0.00–5.00)

## 2019-07-17 MED ORDER — HEPARIN SOD (PORK) LOCK FLUSH 100 UNIT/ML IV SOLN
500.0000 [IU] | Freq: Once | INTRAVENOUS | Status: AC | PRN
  Administered 2019-07-17: 500 [IU] via INTRAVENOUS
  Filled 2019-07-17: qty 5

## 2019-07-17 MED ORDER — SODIUM CHLORIDE 0.9% FLUSH
10.0000 mL | INTRAVENOUS | Status: DC | PRN
  Administered 2019-07-17: 10 mL via INTRAVENOUS
  Filled 2019-07-17: qty 10

## 2019-07-17 NOTE — Telephone Encounter (Signed)
Left VM with CEA results and MD wants to proceed w/MRI.

## 2019-07-17 NOTE — Telephone Encounter (Signed)
-----   Message from Ladell Pier, MD sent at 07/17/2019 12:02 PM EST ----- Please call patient, CEA is elevated, but better than 2 weeks ago  I would like to proceed with the MRI and follow-up as scheduled

## 2019-07-17 NOTE — Progress Notes (Signed)
[place] OFFICE PROGRESS NOTE   Diagnosis: Colon cancer  INTERVAL HISTORY:   Jeff [name] returns as scheduled.  He continues Xeloda.  He is now taking several new medications for diabetes.  He reports a good appetite.  The blood sugar is now running about 200 or less.  Objective:  Vital signs in last 24 hours:  Blood pressure (!) 139/95, pulse (!) 118, temperature 98.2 F (36.8 C), temperature source Temporal, resp. rate 20, height '5\' 10"'$  (1.778 m), weight 232 lb 1.6 oz (105.3 kg), SpO2 100 %.     Cardio: Regular rate and rhythm GI: No hepatosplenomegaly, no mass Vascular: No leg edema Lymph nodes: No cervical, supraclavicular, axillary, or inguinal nodes  Portacath/PICC-without erythema  Lab Results:  Lab Results  Component Value Date   WBC 8.4 07/17/2019   HGB 14.4 07/17/2019   HCT 43.5 07/17/2019   MCV 90.4 07/17/2019   PLT 158 07/17/2019   NEUTROABS 4.3 07/17/2019    CMP  Lab Results  Component Value Date   NA 136 07/02/2019   K 3.9 07/02/2019   CL 98 07/02/2019   CO2 19 (L) 07/02/2019   GLUCOSE 486 (H) 07/02/2019   BUN 10 07/02/2019   CREATININE 1.46 (H) 07/02/2019   CALCIUM 8.9 07/02/2019   PROT 7.7 07/02/2019   ALBUMIN 3.9 07/02/2019   AST 40 07/02/2019   ALT 59 (H) 07/02/2019   ALKPHOS 110 07/02/2019   BILITOT 0.5 07/02/2019   GFRNONAA 55 (L) 07/02/2019   GFRAA >60 07/02/2019    Lab Results  Component Value Date   CEA1 41.35 (H) 07/02/2019     Medications: I have reviewed the patient's current medications.   Assessment/Plan: 1. 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 CTschest, abdomen, and pelvis on 12/11/2015-indeterminate tiny pulmonary nodules, multiple liver metastases, small nodes in the sigmoid mesocolon  3. Laparoscopic sigmoid colectomy, liver biopsy, Port-A-Cath placement 01/14/2016  4. Pathology sigmoid colon  resection-colonic adenocarcinoma, 5.3 cm extending into pericolonic connective tissue, positive lymph-vascular invasion, positive perineural invasion, negative margins, metastatic carcinoma in9 of 28 lymph nodes  5. Pathology liver biopsy-metastatic colorectal adenocarcinoma  6. MSI stable; mismatch repair protein normal  7. 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 Cycle 1 FOLFOX 02/02/2016  9. Cycle 2 FOLFOX 02/15/2016  10. Cycle 3 FOLFOX 02/29/2016  11. Cycle 4 FOLFOX 03/14/2016  12. Cycle 5 FOLFOX 03/28/2016  13. Cycle 6 FOLFOX 04/11/2016 (oxaliplatin held secondary to thrombocytopenia)  14. 04/21/2016 restaging CTs-difficulty evaluating liver lesions due to hepatic steatosis. Stable right adrenal nodule. No adenopathy or local recurrence near the rectosigmoid anastomotic site.  15. Cycle 7 FOLFOX 04/25/2016  16. MRI liver 05/02/2016-partial improvement in hepatic metastases  17. Cycle 8 FOLFOX 05/10/2016  18. Cycle 9 FOLFOX 05/23/2016 (oxaliplatin held due to thrombocytopenia)  19. Cycle 10 FOLFOX 06/06/2016 (oxaliplatin dose reduced due to thrombocytopenia)  20. Cycle 11 FOLFOX 06/27/2016 (oxaliplatin held due to neuropathy)  21. Cycle 12 FOLFOX 07/11/2016 (oxaliplatin held) 22. Initiation of maintenance Xeloda 7 days on/7 days off 07/27/2016 23. MRI liver 11/18/2016-decrease in hepatic metastatic disease. No new or progressive disease identified within the abdomen. 24. Continuation of Xeloda 7 days on/7 days off 25. MRI liver 04/27/2017-previous liver lesions not identified, no new lesions, no lymphadenopathy 26. Xeloda continued 7 days on/7 days off 27. MRI liver 12/04/2017 -no evidence of metastatic disease, hepatic steatosis 28. Xeloda continued 7 days on/7 days off  29. MRI liver 07/15/2018-no evidence of metastatic disease. Stable severe hepatic steatosis. 30. Xeloda continued 7 days on/7 days off 31. MRI liver 03/16/2019-hepatic steatosis, no liver mass, focal  area of intrahepatic biliary dilatation in segments 2 and 3 of the left lobe-increased 32. Xeloda continued 7 days on/7 days off  2. Rectal bleeding and constipation secondary to #1  3. History of peripheral vascular disease, status post left lower extremity vascular bypass surgery in April 2017  4. History of nephrolithiasis  5. History of Graves' disease treated with radioactive iodine  6. Anxiety/depression  7. Hypertension  8. Hospitalization 01/19/2016 with wound dehiscence status post secondary suture closure of abdominal wall  9. 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 Hyperglycemia 06/20/2016-diagnosed with diabetes, maintained on insulin  11.Positive COVID test 12/13/2018   Disposition: Mr. Poitra appears well.  He is completing another week of Xeloda over the next few days.  The CEA was higher when he was here 2 weeks ago.  We will follow up on the CEA from today.  The plan is to proceed with a restaging abdomen MRI if the CEA remains elevated.  He reports better control of his blood sugar on the current diabetes regimen.  He will return for an office visit after the restaging MRI.  We will consider obtaining a CT chest if the MRI is negative.  Betsy Coder, MD  07/17/2019  11:24 AM

## 2019-07-18 ENCOUNTER — Telehealth: Payer: Self-pay | Admitting: Oncology

## 2019-07-18 NOTE — Telephone Encounter (Signed)
Scheduled per los. Called and left msg. Mailed printout  °

## 2019-07-19 ENCOUNTER — Other Ambulatory Visit: Payer: Self-pay | Admitting: Oncology

## 2019-07-19 DIAGNOSIS — C187 Malignant neoplasm of sigmoid colon: Secondary | ICD-10-CM

## 2019-07-23 MED FILL — CAPECITABINE 500 MG TABLET: 500 | 28 days supply | Qty: 112 | Fill #0

## 2019-07-25 ENCOUNTER — Ambulatory Visit: Payer: Medicaid Other | Admitting: Nurse Practitioner

## 2019-07-29 ENCOUNTER — Telehealth: Payer: Self-pay | Admitting: Oncology

## 2019-07-29 ENCOUNTER — Other Ambulatory Visit: Payer: Self-pay | Admitting: Nurse Practitioner

## 2019-07-29 DIAGNOSIS — C189 Malignant neoplasm of colon, unspecified: Secondary | ICD-10-CM

## 2019-07-29 DIAGNOSIS — C787 Secondary malignant neoplasm of liver and intrahepatic bile duct: Secondary | ICD-10-CM

## 2019-07-29 MED ORDER — DIAZEPAM 5 MG PO TABS: 5.0000 mg | ORAL_TABLET | Freq: Once | ORAL | 0 refills | Status: AC

## 2019-07-29 NOTE — Telephone Encounter (Signed)
Scheduled per sch msg. Called and spoke with patient. Confirmed appt  

## 2019-07-30 ENCOUNTER — Ambulatory Visit: Payer: Medicaid Other | Admitting: Nurse Practitioner

## 2019-07-30 ENCOUNTER — Other Ambulatory Visit: Payer: Medicaid Other

## 2019-08-13 ENCOUNTER — Other Ambulatory Visit: Payer: Medicaid Other

## 2019-08-13 ENCOUNTER — Ambulatory Visit: Payer: Medicaid Other | Admitting: Oncology

## 2019-08-13 ENCOUNTER — Other Ambulatory Visit: Payer: Self-pay | Admitting: Cardiovascular Disease

## 2019-08-13 DIAGNOSIS — I10 Essential (primary) hypertension: Secondary | ICD-10-CM

## 2019-08-13 NOTE — Telephone Encounter (Signed)
Refill Request.  

## 2019-08-15 ENCOUNTER — Other Ambulatory Visit: Payer: Self-pay | Admitting: Oncology

## 2019-08-15 DIAGNOSIS — C187 Malignant neoplasm of sigmoid colon: Secondary | ICD-10-CM

## 2019-08-17 ENCOUNTER — Ambulatory Visit: Payer: Medicaid Other | Attending: Internal Medicine

## 2019-08-17 ENCOUNTER — Other Ambulatory Visit: Payer: Medicaid Other

## 2019-08-17 DIAGNOSIS — Z23 Encounter for immunization: Secondary | ICD-10-CM

## 2019-08-17 NOTE — Progress Notes (Signed)
   Covid-19 Vaccination Clinic  Name:  Jeff [name]    MRN: [id number] DOB: Sep 05, 1965  08/17/2019  Mr. [name] was observed post Covid-19 immunization for 15 minutes without incident. He was provided with Vaccine Information Sheet and instruction to access the V-Safe system.   Mr. [name] was instructed to call 911 with any severe reactions post vaccine: Marland Kitchen Difficulty breathing  . Swelling of face and throat  . A fast heartbeat  . A bad rash all over body  . Dizziness and weakness   Immunizations Administered    Name Date Dose VIS Date Route   Pfizer COVID-19 Vaccine 08/17/2019  8:49 AM 0.3 mL 04/26/2019 Intramuscular   Manufacturer: [place]   Lot: [id number]   [place] II: [id number]

## 2019-08-19 ENCOUNTER — Ambulatory Visit
Admission: RE | Admit: 2019-08-19 | Discharge: 2019-08-19 | Disposition: A | Discharge status: Home / Self Care | Payer: Medicaid Other | Source: Ambulatory Visit | Attending: Oncology | Admitting: Oncology

## 2019-08-19 DIAGNOSIS — C787 Secondary malignant neoplasm of liver and intrahepatic bile duct: Secondary | ICD-10-CM

## 2019-08-19 DIAGNOSIS — C189 Malignant neoplasm of colon, unspecified: Secondary | ICD-10-CM

## 2019-08-19 MED ORDER — GADOBENATE DIMEGLUMINE 529 MG/ML IV SOLN
20.0000 mL | Freq: Once | INTRAVENOUS | Status: AC | PRN
  Administered 2019-08-19: 20 mL via INTRAVENOUS

## 2019-08-20 MED FILL — TAMSULOSIN HCL 0.4 MG CAP: 0.4 | 30 days supply | Qty: 30 | Fill #5

## 2019-08-20 MED FILL — CAPECITABINE 500 MG TABLET: 500 | 28 days supply | Qty: 112 | Fill #0

## 2019-08-23 ENCOUNTER — Telehealth: Payer: Self-pay | Admitting: Nurse Practitioner

## 2019-08-23 ENCOUNTER — Inpatient Hospital Stay: Payer: Medicaid Other | Attending: Oncology | Admitting: Nurse Practitioner

## 2019-08-23 ENCOUNTER — Other Ambulatory Visit: Payer: Self-pay

## 2019-08-23 ENCOUNTER — Encounter: Payer: Self-pay | Admitting: Nurse Practitioner

## 2019-08-23 VITALS — BP 118/86 | HR 105 | Temp 97.8°F | Resp 18 | Ht 70.0 in | Wt 229.6 lb

## 2019-08-23 DIAGNOSIS — Z5112 Encounter for antineoplastic immunotherapy: Secondary | ICD-10-CM | POA: Insufficient documentation

## 2019-08-23 DIAGNOSIS — C189 Malignant neoplasm of colon, unspecified: Secondary | ICD-10-CM | POA: Diagnosis not present

## 2019-08-23 DIAGNOSIS — Z8616 Personal history of COVID-19: Secondary | ICD-10-CM | POA: Diagnosis not present

## 2019-08-23 DIAGNOSIS — C187 Malignant neoplasm of sigmoid colon: Secondary | ICD-10-CM

## 2019-08-23 DIAGNOSIS — E119 Type 2 diabetes mellitus without complications: Secondary | ICD-10-CM | POA: Insufficient documentation

## 2019-08-23 DIAGNOSIS — C787 Secondary malignant neoplasm of liver and intrahepatic bile duct: Secondary | ICD-10-CM | POA: Insufficient documentation

## 2019-08-23 DIAGNOSIS — Z794 Long term (current) use of insulin: Secondary | ICD-10-CM | POA: Diagnosis not present

## 2019-08-23 DIAGNOSIS — C7800 Secondary malignant neoplasm of unspecified lung: Secondary | ICD-10-CM | POA: Insufficient documentation

## 2019-08-23 DIAGNOSIS — I10 Essential (primary) hypertension: Secondary | ICD-10-CM | POA: Diagnosis not present

## 2019-08-23 DIAGNOSIS — Z5111 Encounter for antineoplastic chemotherapy: Secondary | ICD-10-CM | POA: Insufficient documentation

## 2019-08-23 MED ORDER — PROCHLORPERAZINE MALEATE 10 MG PO TABS: ORAL_TABLET | ORAL | 0 refills | Status: DC

## 2019-08-23 NOTE — Telephone Encounter (Signed)
Scheduled appts per 4/8 los. Pt declined print out of AVS and requested a print out of appt calendar

## 2019-08-23 NOTE — Progress Notes (Addendum)
[place] OFFICE PROGRESS NOTE   Diagnosis: Colon cancer  INTERVAL HISTORY:   Mr. Wiacek returns as scheduled.  He continues Xeloda.  He feels well.  No nausea or vomiting.  No mouth sores.  No diarrhea.  No fever, cough, shortness of breath.  No pain.  Objective:  Vital signs in last 24 hours:  Blood pressure 118/86, pulse (!) 105, temperature 97.8 F (36.6 C), temperature source Temporal, resp. rate 18, height _0  (1.778 m), weight 229 lb 9.6 oz (104.1 kg), SpO2 100 %.    HEENT: No thrush or ulcers. Resp: Lungs clear bilaterally. Cardio: Regular rate and rhythm. GI: Abdomen soft and nontender.  No hepatomegaly.  No mass. Vascular: No leg edema. Skin: Palms without erythema. Port-A-Cath without erythema.   Lab Results:  Lab Results  Component Value Date   WBC 8.4 07/17/2019   HGB 14.4 07/17/2019   HCT 43.5 07/17/2019   MCV 90.4 07/17/2019   PLT 158 07/17/2019   NEUTROABS 4.3 07/17/2019    Imaging:  No results found.  Medications: I have reviewed the patient's current medications.  Assessment/Plan: 1. 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 CTschest, abdomen, and pelvis on 12/11/2015-indeterminate tiny pulmonary nodules, multiple liver metastases, small nodes in the sigmoid mesocolon  3. Laparoscopic sigmoid colectomy, liver biopsy, Port-A-Cath placement 01/14/2016  4. Pathology sigmoid colon resection-colonic adenocarcinoma, 5.3 cm extending into pericolonic connective tissue, positive lymph-vascular invasion, positive perineural invasion, negative margins, metastatic carcinoma in9 of 28 lymph nodes  5. Pathology liver biopsy-metastatic colorectal adenocarcinoma  6. MSI stable; mismatch repair protein normal  7. 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 Cycle 1 FOLFOX 02/02/2016  9. Cycle 2 FOLFOX 02/15/2016  10. Cycle 3 FOLFOX  02/29/2016  11. Cycle 4 FOLFOX 03/14/2016  12. Cycle 5 FOLFOX 03/28/2016  13. Cycle 6 FOLFOX 04/11/2016 (oxaliplatin held secondary to thrombocytopenia)  14. 04/21/2016 restaging CTs-difficulty evaluating liver lesions due to hepatic steatosis. Stable right adrenal nodule. No adenopathy or local recurrence near the rectosigmoid anastomotic site.  15. Cycle 7 FOLFOX 04/25/2016  16. MRI liver 05/02/2016-partial improvement in hepatic metastases  17. Cycle 8 FOLFOX 05/10/2016  18. Cycle 9 FOLFOX 05/23/2016 (oxaliplatin held due to thrombocytopenia)  19. Cycle 10 FOLFOX 06/06/2016 (oxaliplatin dose reduced due to thrombocytopenia)  20. Cycle 11 FOLFOX 06/27/2016 (oxaliplatin held due to neuropathy)  21. Cycle 12 FOLFOX 07/11/2016 (oxaliplatin held) 22. Initiation of maintenance Xeloda 7 days on/7 days off 07/27/2016 23. MRI liver 11/18/2016-decrease in hepatic metastatic disease. No new or progressive disease identified within the abdomen. 24. Continuation of Xeloda 7 days on/7 days off 25. MRI liver 04/27/2017-previous liver lesions not identified, no new lesions, no lymphadenopathy 26. Xeloda continued 7 days on/7 days off 27. MRI liver 12/04/2017 -no evidence of metastatic disease, hepatic steatosis 28. Xeloda continued 7 days on/7 days off 29. MRI liver 07/15/2018-no evidence of metastatic disease. Stable severe hepatic steatosis. 30. Xeloda continued 7 days on/7 days off 31. MRI liver 03/16/2019-hepatic steatosis, no liver mass, focal area of intrahepatic biliary dilatation in segments 2 and 3 of the left lobe-increased 32. Xeloda continued 7 days on/7 days off 33. MRI abdomen 08/19/2019-no findings to suggest liver metastases.  Bilateral lung nodules measuring up to 2.8 cm, progressive and more conspicuous than on previous exam  2. Rectal bleeding and constipation secondary to #1  3. History of peripheral vascular disease, status post left lower extremity vascular  bypass surgery in  April 2017  4. History of nephrolithiasis  5. History of Graves' disease treated with radioactive iodine  6. Anxiety/depression  7. Hypertension  8. Hospitalization 01/19/2016 with wound dehiscence status post secondary suture closure of abdominal wall  9. 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 Hyperglycemia 06/20/2016-diagnosed with diabetes, maintained on insulin  11.Positive COVID test 12/13/2018   Disposition: Mr. Crill appears stable.  We reviewed the recent abdominal MRI with him and his wife at today's visit.  There is no evidence of cancer in the liver.  Several lung nodules were noted.  We are referring him for a chest CT.  He will return for a follow-up visit in 2-weeks to review the result.  Patient seen with Dr. Benay Spice.  MRI images reviewed on the computer with Mr. Strong and his wife.    Ned Card ANP/GNP-BC   08/23/2019  11:22 AM This was a shared visit with Ned Card.  We reviewed the MRI findings and images with Mr. Heap and his wife.  The lung nodules may be related to metastatic colon cancer or another etiology.  We will schedule a chest CT and then consider a diagnostic biopsy.  Julieanne Manson, MD

## 2019-08-23 NOTE — Telephone Encounter (Signed)
R/s appt per 4/9 sch message - unable to reach pt . Left message with appt date and time   

## 2019-08-28 ENCOUNTER — Other Ambulatory Visit: Payer: Medicaid Other

## 2019-08-29 ENCOUNTER — Ambulatory Visit (HOSPITAL_COMMUNITY)
Admission: RE | Admit: 2019-08-29 | Discharge: 2019-08-29 | Disposition: A | Discharge status: Home / Self Care | Payer: Medicaid Other | Source: Ambulatory Visit | Attending: Nurse Practitioner | Admitting: Nurse Practitioner

## 2019-08-29 ENCOUNTER — Inpatient Hospital Stay: Payer: Medicaid Other

## 2019-08-29 ENCOUNTER — Other Ambulatory Visit: Payer: Self-pay

## 2019-08-29 DIAGNOSIS — C187 Malignant neoplasm of sigmoid colon: Secondary | ICD-10-CM | POA: Diagnosis present

## 2019-08-29 DIAGNOSIS — C189 Malignant neoplasm of colon, unspecified: Secondary | ICD-10-CM | POA: Diagnosis present

## 2019-08-29 DIAGNOSIS — Z5112 Encounter for antineoplastic immunotherapy: Secondary | ICD-10-CM | POA: Diagnosis not present

## 2019-08-29 DIAGNOSIS — C787 Secondary malignant neoplasm of liver and intrahepatic bile duct: Secondary | ICD-10-CM

## 2019-08-29 DIAGNOSIS — Z95828 Presence of other vascular implants and grafts: Secondary | ICD-10-CM

## 2019-08-29 LAB — BASIC METABOLIC PANEL - CANCER CENTER ONLY
Anion gap: 12 (ref 5–15)
BUN: 12 mg/dL (ref 6–20)
CO2: 25 mmol/L (ref 22–32)
Calcium: 9.1 mg/dL (ref 8.9–10.3)
Chloride: 104 mmol/L (ref 98–111)
Creatinine: 1.29 mg/dL — ABNORMAL HIGH (ref 0.61–1.24)
GFR, Est AFR Am: >60 mL/min (ref >60)
GFR, Estimated: >60 mL/min (ref >60)
Glucose, Bld: 219 mg/dL — ABNORMAL HIGH (ref 70–99)
Potassium: 4.2 mmol/L (ref 3.5–5.1)
Sodium: 141 mmol/L (ref 135–145)

## 2019-08-29 MED ORDER — SODIUM CHLORIDE 0.9% FLUSH
10.0000 mL | INTRAVENOUS | Status: DC | PRN
  Administered 2019-08-29: 14:00:00 10 mL via INTRAVENOUS
  Filled 2019-08-29: qty 10

## 2019-08-29 MED ORDER — HEPARIN SOD (PORK) LOCK FLUSH 100 UNIT/ML IV SOLN
500.0000 [IU] | Freq: Once | INTRAVENOUS | Status: AC
  Administered 2019-08-29: 500 [IU] via INTRAVENOUS

## 2019-08-29 MED ORDER — HEPARIN SOD (PORK) LOCK FLUSH 100 UNIT/ML IV SOLN
INTRAVENOUS | Status: AC
  Filled 2019-08-29: qty 5

## 2019-08-29 MED ORDER — SODIUM CHLORIDE (PF) 0.9 % IJ SOLN
INTRAMUSCULAR | Status: AC
  Filled 2019-08-29: qty 50

## 2019-08-29 MED ORDER — IOHEXOL 300 MG/ML  SOLN
75.0000 mL | Freq: Once | INTRAMUSCULAR | Status: AC | PRN
  Administered 2019-08-29: 75 mL via INTRAVENOUS

## 2019-08-29 NOTE — Progress Notes (Signed)
Patient left accessed for CT 

## 2019-09-03 ENCOUNTER — Other Ambulatory Visit: Payer: Self-pay

## 2019-09-03 ENCOUNTER — Inpatient Hospital Stay (HOSPITAL_BASED_OUTPATIENT_CLINIC_OR_DEPARTMENT_OTHER): Payer: Medicaid Other | Admitting: Oncology

## 2019-09-03 VITALS — BP 132/99 | HR 101 | Temp 98.3°F | Resp 20 | Ht 70.0 in | Wt 231.1 lb

## 2019-09-03 DIAGNOSIS — Z7189 Other specified counseling: Secondary | ICD-10-CM

## 2019-09-03 DIAGNOSIS — Z5112 Encounter for antineoplastic immunotherapy: Secondary | ICD-10-CM | POA: Diagnosis not present

## 2019-09-03 DIAGNOSIS — C187 Malignant neoplasm of sigmoid colon: Secondary | ICD-10-CM | POA: Diagnosis not present

## 2019-09-03 NOTE — Progress Notes (Signed)
Provided patient/wife printed and verbal information on Avastin and Irinotecan and diarrhea management with Lomotil.

## 2019-09-03 NOTE — Progress Notes (Signed)
[place] OFFICE PROGRESS NOTE   Diagnosis: Colon cancer  INTERVAL HISTORY:   Mr. Siegel returns as scheduled.  He feels well.  No new complaint.  Objective:  Vital signs in last 24 hours:  Blood pressure (!) 132/99, pulse (!) 101, temperature 98.3 F (36.8 C), temperature source Temporal, resp. rate 20, height '5\' 10"'$  (1.778 m), weight 231 lb 1.6 oz (104.8 kg), SpO2 100 %.     Resp: Lungs clear bilaterally Cardio: Regular rate and rhythm Vascular: No leg edema  Portacath/PICC-without erythema  Lab Results:  Lab Results  Component Value Date   WBC 8.4 07/17/2019   HGB 14.4 07/17/2019   HCT 43.5 07/17/2019   MCV 90.4 07/17/2019   PLT 158 07/17/2019   NEUTROABS 4.3 07/17/2019    CMP  Lab Results  Component Value Date   NA 141 08/29/2019   K 4.2 08/29/2019   CL 104 08/29/2019   CO2 25 08/29/2019   GLUCOSE 219 (H) 08/29/2019   BUN 12 08/29/2019   CREATININE 1.29 (H) 08/29/2019   CALCIUM 9.1 08/29/2019   PROT 8.1 07/17/2019   ALBUMIN 4.0 07/17/2019   AST 44 (H) 07/17/2019   ALT 50 (H) 07/17/2019   ALKPHOS 115 07/17/2019   BILITOT 0.6 07/17/2019   GFRNONAA >60 08/29/2019   GFRAA >60 08/29/2019    Lab Results  Component Value Date   CEA1 29.09 (H) 07/17/2019    Medications: I have reviewed the patient's current medications.   Assessment/Plan: 1. 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 CTschest, abdomen, and pelvis on 12/11/2015-indeterminate tiny pulmonary nodules, multiple liver metastases, small nodes in the sigmoid mesocolon  3. Laparoscopic sigmoid colectomy, liver biopsy, Port-A-Cath placement 01/14/2016  4. Pathology sigmoid colon resection-colonic adenocarcinoma, 5.3 cm extending into pericolonic connective tissue, positive lymph-vascular invasion, positive perineural invasion, negative margins, metastatic carcinoma in9 of 28 lymph  nodes  5. Pathology liver biopsy-metastatic colorectal adenocarcinoma  6. MSI stable; mismatch repair protein normal  7. 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 Cycle 1 FOLFOX 02/02/2016  9. Cycle 2 FOLFOX 02/15/2016  10. Cycle 3 FOLFOX 02/29/2016  11. Cycle 4 FOLFOX 03/14/2016  12. Cycle 5 FOLFOX 03/28/2016  13. Cycle 6 FOLFOX 04/11/2016 (oxaliplatin held secondary to thrombocytopenia)  14. 04/21/2016 restaging CTs-difficulty evaluating liver lesions due to hepatic steatosis. Stable right adrenal nodule. No adenopathy or local recurrence near the rectosigmoid anastomotic site.  15. Cycle 7 FOLFOX 04/25/2016  16. MRI liver 05/02/2016-partial improvement in hepatic metastases  17. Cycle 8 FOLFOX 05/10/2016  18. Cycle 9 FOLFOX 05/23/2016 (oxaliplatin held due to thrombocytopenia)  19. Cycle 10 FOLFOX 06/06/2016 (oxaliplatin dose reduced due to thrombocytopenia)  20. Cycle 11 FOLFOX 06/27/2016 (oxaliplatin held due to neuropathy)  21. Cycle 12 FOLFOX 07/11/2016 (oxaliplatin held) 22. Initiation of maintenance Xeloda 7 days on/7 days off 07/27/2016 23. MRI liver 11/18/2016-decrease in hepatic metastatic disease. No new or progressive disease identified within the abdomen. 24. Continuation of Xeloda 7 days on/7 days off 25. MRI liver 04/27/2017-previous liver lesions not identified, no new lesions, no lymphadenopathy 26. Xeloda continued 7 days on/7 days off 27. MRI liver 12/04/2017 -no evidence of metastatic disease, hepatic steatosis 28. Xeloda continued 7 days on/7 days off 29. MRI liver 07/15/2018-no evidence of metastatic disease. Stable severe hepatic steatosis. 30. Xeloda continued 7 days on/7 days off 31. MRI liver 03/16/2019-hepatic steatosis, no liver mass, focal area of intrahepatic biliary dilatation in  segments 2 and 3 of the left lobe-increased 32. Xeloda continued 7 days on/7 days off 33. MRI abdomen 08/19/2019-no findings to suggest liver metastases.  Bilateral  lung nodules measuring up to 2.8 cm, progressive and more conspicuous than on previous exam 34. CT chest 08/29/2019-multiple pulmonary metastases, new from 04/21/2016  2. Rectal bleeding and constipation secondary to #1  3. History of peripheral vascular disease, status post left lower extremity vascular bypass surgery in April 2017  4. History of nephrolithiasis  5. History of Graves' disease treated with radioactive iodine  6. Anxiety/depression  7. Hypertension  8. Hospitalization 01/19/2016 with wound dehiscence status post secondary suture closure of abdominal wall  9. 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 Hyperglycemia 06/20/2016-diagnosed with diabetes, maintained on insulin  11.Positive COVID test 12/13/2018     Disposition: Mr. Shieh has metastatic colon cancer.  The CEA is higher and a CT chest confirms multiple new/enlarging lung lesions.  I reviewed the CT findings and discussed treatment options with Mr. Whitwell and his wife.  I reviewed the CT images.  He understands no therapy will be curative.  We discussed repeat treatment with FOLFOX, FOLFIRI, bevacizumab, and Panitumumab therapies.  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  I recommend treatment with FOLFIRI.  We discussed the risk/benefit of adding bevacizumab.  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  A cardiac catheterization in January 2014 showed no significant coronary artery disease.  He has a history of peripheral vascular disease and underwent an above-knee to below-knee left bypass graft in 2017.  We discussed the risk associated with the FOLFIRI regimen including the chance for nausea/vomiting, mucositis, diarrhea, alopecia, and hematologic toxicity.  We discussed the hypertension, bleeding, thromboembolic disease, bowel perforation, delayed wound  healing, nephrotoxicity, and CNS toxicity associated with bevacizumab.  Mr. Rosko agrees to proceed with FOLFIRI/bevacizumab.  He understands the potential for cardiovascular events while on bevacizumab.  He will be a candidate for Panitumumab based therapy and repeat treatment with oxaliplatin in the future.  Orders were entered for FOLFIRI/Avastin today.  Betsy Coder, MD  09/03/2019  8:36 AM

## 2019-09-03 NOTE — Progress Notes (Signed)
DISCONTINUE ON PATHWAY REGIMEN - Colorectal     A cycle is every 14 days:     Oxaliplatin        Dose Mod: None     Leucovorin        Dose Mod: None     5-Fluorouracil        Dose Mod: None     5-Fluorouracil        Dose Mod: None  **Always confirm dose/schedule in your pharmacy ordering system**  REASON: Other Reason PRIOR TREATMENT: [id number]: [id number] q14 Days TREATMENT RESPONSE: Unable to Evaluate  START ON PATHWAY REGIMEN - Colorectal     A cycle is every 14 days:     Bevacizumab-xxxx      Irinotecan      Leucovorin      Fluorouracil      Fluorouracil   **Always confirm dose/schedule in your pharmacy ordering system**  Patient Characteristics: Distant Metastases, Nonsurgical Candidate, KRAS/NRAS Wild-Type (BRAF V600 Wild-Type/Unknown), Standard Cytotoxic Therapy, Second Line Standard Cytotoxic Therapy, Bevacizumab Eligible Tumor Location: Colon Therapeutic Status: Distant Metastases Microsatellite/Mismatch Repair Status: MSS/pMMR BRAF Mutation Status: Wild-Type (no mutation) KRAS/NRAS Mutation Status: Wild-Type (no mutation) Preferred Therapy Approach: Standard Cytotoxic Therapy Standard Cytotoxic Line of Therapy: Second Line Standard Cytotoxic Therapy Bevacizumab Eligibility: Eligible Intent of Therapy: Non-Curative / Palliative Intent, Discussed with Patient

## 2019-09-05 ENCOUNTER — Other Ambulatory Visit: Payer: Self-pay | Admitting: Oncology

## 2019-09-05 ENCOUNTER — Telehealth: Payer: Self-pay | Admitting: *Deleted

## 2019-09-05 NOTE — Telephone Encounter (Signed)
Left message asking if office will complete her FMLA forms for intermittent leave to help care of Kaiyu during his treatment. Called back and left her a VM to have him bring papers on Monday or fax to office and to be sure she has completed her portion of the forms.

## 2019-09-09 ENCOUNTER — Inpatient Hospital Stay: Payer: Medicaid Other

## 2019-09-09 ENCOUNTER — Encounter: Payer: Self-pay | Admitting: *Deleted

## 2019-09-09 ENCOUNTER — Other Ambulatory Visit: Payer: Self-pay

## 2019-09-09 VITALS — BP 127/82 | HR 95 | Temp 97.8°F | Resp 18

## 2019-09-09 DIAGNOSIS — Z95828 Presence of other vascular implants and grafts: Secondary | ICD-10-CM

## 2019-09-09 DIAGNOSIS — C187 Malignant neoplasm of sigmoid colon: Secondary | ICD-10-CM

## 2019-09-09 DIAGNOSIS — Z5112 Encounter for antineoplastic immunotherapy: Secondary | ICD-10-CM | POA: Diagnosis not present

## 2019-09-09 LAB — CMP (CANCER CENTER ONLY)
ALT: 42 U/L (ref 0–44)
AST: 30 U/L (ref 15–41)
Albumin: 3.7 g/dL (ref 3.5–5.0)
Alkaline Phosphatase: 103 U/L (ref 38–126)
Anion gap: 15 (ref 5–15)
BUN: 12 mg/dL (ref 6–20)
CO2: 23 mmol/L (ref 22–32)
Calcium: 8.9 mg/dL (ref 8.9–10.3)
Chloride: 101 mmol/L (ref 98–111)
Creatinine: 1.43 mg/dL — ABNORMAL HIGH (ref 0.61–1.24)
GFR, Est AFR Am: >60 mL/min (ref >60)
GFR, Estimated: 56 mL/min — ABNORMAL LOW (ref >60)
Glucose, Bld: 391 mg/dL — ABNORMAL HIGH (ref 70–99)
Potassium: 3.8 mmol/L (ref 3.5–5.1)
Sodium: 139 mmol/L (ref 135–145)
Total Bilirubin: 0.6 mg/dL (ref 0.3–1.2)
Total Protein: 7.7 g/dL (ref 6.5–8.1)

## 2019-09-09 LAB — CBC WITH DIFFERENTIAL (CANCER CENTER ONLY)
Abs Immature Granulocytes: 0.06 10*3/uL (ref 0.00–0.07)
Basophils Absolute: 0 10*3/uL (ref 0.0–0.1)
Basophils Relative: 0 %
Eosinophils Absolute: 0.2 10*3/uL (ref 0.0–0.5)
Eosinophils Relative: 3 %
HCT: 44.2 % (ref 39.0–52.0)
Hemoglobin: 14.2 g/dL (ref 13.0–17.0)
Immature Granulocytes: 1 %
Lymphocytes Relative: 34 %
Lymphs Abs: 2.9 10*3/uL (ref 0.7–4.0)
MCH: 29.8 pg (ref 26.0–34.0)
MCHC: 32.1 g/dL (ref 30.0–36.0)
MCV: 92.7 fL (ref 80.0–100.0)
Monocytes Absolute: 0.7 10*3/uL (ref 0.1–1.0)
Monocytes Relative: 9 %
Neutro Abs: 4.7 10*3/uL (ref 1.7–7.7)
Neutrophils Relative %: 53 %
Platelet Count: 142 10*3/uL — ABNORMAL LOW (ref 150–400)
RBC: 4.77 MIL/uL (ref 4.22–5.81)
RDW: 16.2 % — ABNORMAL HIGH (ref 11.5–15.5)
WBC Count: 8.6 10*3/uL (ref 4.0–10.5)
nRBC: 0 % (ref 0.0–0.2)

## 2019-09-09 LAB — CEA (IN HOUSE-CHCC): CEA (CHCC-In House): 33.47 ng/mL — ABNORMAL HIGH (ref 0.00–5.00)

## 2019-09-09 LAB — TOTAL PROTEIN, URINE DIPSTICK: Protein, ur: NEGATIVE mg/dL

## 2019-09-09 MED ORDER — SODIUM CHLORIDE 0.9 % IV SOLN
Freq: Once | INTRAVENOUS | Status: AC
  Filled 2019-09-09: qty 250

## 2019-09-09 MED ORDER — PALONOSETRON HCL INJECTION 0.25 MG/5ML
INTRAVENOUS | Status: AC
  Filled 2019-09-09: qty 5

## 2019-09-09 MED ORDER — SODIUM CHLORIDE 0.9 % IV SOLN
10.0000 mg | Freq: Once | INTRAVENOUS | Status: AC
  Administered 2019-09-09: 10 mg via INTRAVENOUS
  Filled 2019-09-09: qty 10

## 2019-09-09 MED ORDER — ATROPINE SULFATE 1 MG/ML IJ SOLN
INTRAMUSCULAR | Status: AC
  Filled 2019-09-09: qty 1

## 2019-09-09 MED ORDER — SODIUM CHLORIDE 0.9 % IV SOLN
5.0000 mg/kg | Freq: Once | INTRAVENOUS | Status: AC
  Administered 2019-09-09: 500 mg via INTRAVENOUS
  Filled 2019-09-09: qty 16

## 2019-09-09 MED ORDER — ATROPINE SULFATE 1 MG/ML IJ SOLN
0.5000 mg | Freq: Once | INTRAMUSCULAR | Status: AC | PRN
  Administered 2019-09-09: 0.5 mg via INTRAVENOUS

## 2019-09-09 MED ORDER — PALONOSETRON HCL INJECTION 0.25 MG/5ML
0.2500 mg | Freq: Once | INTRAVENOUS | Status: AC
  Administered 2019-09-09: 09:00:00 0.25 mg via INTRAVENOUS

## 2019-09-09 MED ORDER — SODIUM CHLORIDE 0.9 % IV SOLN
400.0000 mg/m2 | Freq: Once | INTRAVENOUS | Status: AC
  Administered 2019-09-09: 10:00:00 912 mg via INTRAVENOUS
  Filled 2019-09-09: qty 45.6

## 2019-09-09 MED ORDER — SODIUM CHLORIDE 0.9 % IV SOLN
179.0000 mg/m2 | Freq: Once | INTRAVENOUS | Status: AC
  Administered 2019-09-09: 10:00:00 400 mg via INTRAVENOUS
  Filled 2019-09-09: qty 15

## 2019-09-09 MED ORDER — SODIUM CHLORIDE 0.9% FLUSH
10.0000 mL | INTRAVENOUS | Status: DC | PRN
  Administered 2019-09-09: 10 mL via INTRAVENOUS
  Filled 2019-09-09: qty 10

## 2019-09-09 MED ORDER — SODIUM CHLORIDE 0.9 % IV SOLN
2000.0000 mg/m2 | INTRAVENOUS | Status: DC
  Administered 2019-09-09: 12:00:00 4550 mg via INTRAVENOUS
  Filled 2019-09-09: qty 91

## 2019-09-09 MED ORDER — FLUOROURACIL CHEMO INJECTION 2.5 GM/50ML
400.0000 mg/m2 | Freq: Once | INTRAVENOUS | Status: AC
  Administered 2019-09-09: 900 mg via INTRAVENOUS
  Filled 2019-09-09: qty 18

## 2019-09-09 NOTE — Patient Instructions (Addendum)
[facility] Discharge Instructions for Patients Receiving Chemotherapy  Today you received the following chemotherapy agents: Avastin, Irinotecan, Leucovorin, 5FU  To help prevent nausea and vomiting after your treatment, we encourage you to take your nausea medication as directed.    If you develop nausea and vomiting that is not controlled by your nausea medication, call the clinic.   BELOW ARE SYMPTOMS THAT SHOULD BE REPORTED IMMEDIATELY:  *FEVER GREATER THAN 100.5 F  *CHILLS WITH OR WITHOUT FEVER  NAUSEA AND VOMITING THAT IS NOT CONTROLLED WITH YOUR NAUSEA MEDICATION  *UNUSUAL SHORTNESS OF BREATH  *UNUSUAL BRUISING OR BLEEDING  TENDERNESS IN MOUTH AND THROAT WITH OR WITHOUT PRESENCE OF ULCERS  *URINARY PROBLEMS  *BOWEL PROBLEMS  UNUSUAL RASH Items with * indicate a potential emergency and should be followed up as soon as possible.  Feel free to call the clinic should you have any questions or concerns. The clinic phone number is [phone].  Please show the [place] at check-in to the Emergency Department and triage nurse.  Bevacizumab injection What is this medicine? BEVACIZUMAB (be va SIZ yoo mab) is a monoclonal antibody. It is used to treat many types of cancer. This medicine may be used for other purposes; ask your health care provider or pharmacist if you have questions. COMMON BRAND NAME(S): Avastin, MVASI, Zirabev What should I tell my health care provider before I take this medicine? They need to know if you have any of these conditions:  diabetes  heart disease  high blood pressure  history of coughing up blood  prior anthracycline chemotherapy (e.g., doxorubicin, daunorubicin, epirubicin)  recent or ongoing radiation therapy  recent or planning to have surgery  stroke  an unusual or allergic reaction to bevacizumab, hamster proteins, mouse proteins, other medicines, foods, dyes, or preservatives  pregnant or trying  to get pregnant  breast-feeding How should I use this medicine? This medicine is for infusion into a vein. It is given by a health care professional in a hospital or clinic setting. Talk to your pediatrician regarding the use of this medicine in children. Special care may be needed. Overdosage: If you think you have taken too much of this medicine contact a poison control center or emergency room at once. NOTE: This medicine is only for you. Do not share this medicine with others. What if I miss a dose? It is important not to miss your dose. Call your doctor or health care professional if you are unable to keep an appointment. What may interact with this medicine? Interactions are not expected. This list may not describe all possible interactions. Give your health care provider a list of all the medicines, herbs, non-prescription drugs, or dietary supplements you use. Also tell them if you smoke, drink alcohol, or use illegal drugs. Some items may interact with your medicine. What should I watch for while using this medicine? Your condition will be monitored carefully while you are receiving this medicine. You will need important blood work and urine testing done while you are taking this medicine. This medicine may increase your risk to bruise or bleed. Call your doctor or health care professional if you notice any unusual bleeding. Before having surgery, talk to your health care provider to make sure it is ok. This drug can increase the risk of poor healing of your surgical site or wound. You will need to stop this drug for 28 days before surgery. After surgery, wait at least 28 days before restarting this drug.  Make sure the surgical site or wound is healed enough before restarting this drug. Talk to your health care provider if questions. Do not become pregnant while taking this medicine or for 6 months after stopping it. Women should inform their doctor if they wish to become pregnant or think  they might be pregnant. There is a potential for serious side effects to an unborn child. Talk to your health care professional or pharmacist for more information. Do not breast-feed an infant while taking this medicine and for 6 months after the last dose. This medicine has caused ovarian failure in some women. This medicine may interfere with the ability to have a child. You should talk to your doctor or health care professional if you are concerned about your fertility. What side effects may I notice from receiving this medicine? Side effects that you should report to your doctor or health care professional as soon as possible:  allergic reactions like skin rash, itching or hives, swelling of the face, lips, or tongue  chest pain or chest tightness  chills  coughing up blood  high fever  seizures  severe constipation  signs and symptoms of bleeding such as bloody or black, tarry stools; red or dark-brown urine; spitting up blood or brown material that looks like coffee grounds; red spots on the skin; unusual bruising or bleeding from the eye, gums, or nose  signs and symptoms of a blood clot such as breathing problems; chest pain; severe, sudden headache; pain, swelling, warmth in the leg  signs and symptoms of a stroke like changes in vision; confusion; trouble speaking or understanding; severe headaches; sudden numbness or weakness of the face, arm or leg; trouble walking; dizziness; loss of balance or coordination  stomach pain  sweating  swelling of legs or ankles  vomiting  weight gain Side effects that usually do not require medical attention (report to your doctor or health care professional if they continue or are bothersome):  back pain  changes in taste  decreased appetite  dry skin  nausea  tiredness This list may not describe all possible side effects. Call your doctor for medical advice about side effects. You may report side effects to FDA at  1-800-FDA-1088. Where should I keep my medicine? This drug is given in a hospital or clinic and will not be stored at home. NOTE: This sheet is a summary. It may not cover all possible information. If you have questions about this medicine, talk to your doctor, pharmacist, or health care provider.  2020 Elsevier/Gold Standard (2019-02-27 10:50:46)  Irinotecan injection What is this medicine? IRINOTECAN (ir in oh TEE kan ) is a chemotherapy drug. It is used to treat colon and rectal cancer. This medicine may be used for other purposes; ask your health care provider or pharmacist if you have questions. COMMON BRAND NAME(S): Camptosar What should I tell my health care provider before I take this medicine? They need to know if you have any of these conditions:  dehydration  diarrhea  infection (especially a virus infection such as chickenpox, cold sores, or herpes)  liver disease  low blood counts, like low white cell, platelet, or red cell counts  low levels of calcium, magnesium, or potassium in the blood  recent or ongoing radiation therapy  an unusual or allergic reaction to irinotecan, other medicines, foods, dyes, or preservatives  pregnant or trying to get pregnant  breast-feeding How should I use this medicine? This drug is given as an infusion into a  vein. It is administered in a hospital or clinic by a specially trained health care professional. Talk to your pediatrician regarding the use of this medicine in children. Special care may be needed. Overdosage: If you think you have taken too much of this medicine contact a poison control center or emergency room at once. NOTE: This medicine is only for you. Do not share this medicine with others. What if I miss a dose? It is important not to miss your dose. Call your doctor or health care professional if you are unable to keep an appointment. What may interact with this medicine? This medicine may interact with the  following medications:  antiviral medicines for HIV or AIDS  certain antibiotics like rifampin or rifabutin  certain medicines for fungal infections like itraconazole, ketoconazole, posaconazole, and voriconazole  certain medicines for seizures like carbamazepine, phenobarbital, phenotoin  clarithromycin  gemfibrozil  nefazodone  St. John's Wort This list may not describe all possible interactions. Give your health care provider a list of all the medicines, herbs, non-prescription drugs, or dietary supplements you use. Also tell them if you smoke, drink alcohol, or use illegal drugs. Some items may interact with your medicine. What should I watch for while using this medicine? Your condition will be monitored carefully while you are receiving this medicine. You will need important blood work done while you are taking this medicine. This drug may make you feel generally unwell. This is not uncommon, as chemotherapy can affect healthy cells as well as cancer cells. Report any side effects. Continue your course of treatment even though you feel ill unless your doctor tells you to stop. In some cases, you may be given additional medicines to help with side effects. Follow all directions for their use. You may get drowsy or dizzy. Do not drive, use machinery, or do anything that needs mental alertness until you know how this medicine affects you. Do not stand or sit up quickly, especially if you are an older patient. This reduces the risk of dizzy or fainting spells. Call your health care professional for advice if you get a fever, chills, or sore throat, or other symptoms of a cold or flu. Do not treat yourself. This medicine decreases your body's ability to fight infections. Try to avoid being around people who are sick. Avoid taking products that contain aspirin, acetaminophen, ibuprofen, naproxen, or ketoprofen unless instructed by your doctor. These medicines may hide a fever. This medicine  may increase your risk to bruise or bleed. Call your doctor or health care professional if you notice any unusual bleeding. Be careful brushing and flossing your teeth or using a toothpick because you may get an infection or bleed more easily. If you have any dental work done, tell your dentist you are receiving this medicine. Do not become pregnant while taking this medicine or for 6 months after stopping it. Women should inform their health care professional if they wish to become pregnant or think they might be pregnant. Men should not father a child while taking this medicine and for 3 months after stopping it. There is potential for serious side effects to an unborn child. Talk to your health care professional for more information. Do not breast-feed an infant while taking this medicine or for 7 days after stopping it. This medicine has caused ovarian failure in some women. This medicine may make it more difficult to get pregnant. Talk to your health care professional if you are concerned about your fertility. This medicine  has caused decreased sperm counts in some men. This may make it more difficult to father a child. Talk to your health care professional if you are concerned about your fertility. What side effects may I notice from receiving this medicine? Side effects that you should report to your doctor or health care professional as soon as possible:  allergic reactions like skin rash, itching or hives, swelling of the face, lips, or tongue  chest pain  diarrhea  flushing, runny nose, sweating during infusion  low blood counts - this medicine may decrease the number of white blood cells, red blood cells and platelets. You may be at increased risk for infections and bleeding.  nausea, vomiting  pain, swelling, warmth in the leg  signs of decreased platelets or bleeding - bruising, pinpoint red spots on the skin, black, tarry stools, blood in the urine  signs of infection - fever  or chills, cough, sore throat, pain or difficulty passing urine  signs of decreased red blood cells - unusually weak or tired, fainting spells, lightheadedness Side effects that usually do not require medical attention (report to your doctor or health care professional if they continue or are bothersome):  constipation  hair loss  headache  loss of appetite  mouth sores  stomach pain This list may not describe all possible side effects. Call your doctor for medical advice about side effects. You may report side effects to FDA at 1-800-FDA-1088. Where should I keep my medicine? This drug is given in a hospital or clinic and will not be stored at home. NOTE: This sheet is a summary. It may not cover all possible information. If you have questions about this medicine, talk to your doctor, pharmacist, or health care provider.  2020 Elsevier/Gold Standard (2018-06-22 10:09:17)

## 2019-09-09 NOTE — Patient Instructions (Signed)

## 2019-09-09 NOTE — Progress Notes (Signed)
Received faxed FMLA forms from [employer] for wife, Elnoria Howard for intermittent leave to assist her husband, Cheney with care at home and take to MD appointments. Forwarded to [place], [place]

## 2019-09-11 ENCOUNTER — Inpatient Hospital Stay: Payer: Medicaid Other

## 2019-09-11 ENCOUNTER — Ambulatory Visit: Payer: Medicaid Other | Attending: Internal Medicine

## 2019-09-11 ENCOUNTER — Ambulatory Visit: Payer: Medicaid Other

## 2019-09-11 ENCOUNTER — Other Ambulatory Visit: Payer: Self-pay

## 2019-09-11 VITALS — BP 117/84 | HR 95 | Temp 98.3°F | Resp 20

## 2019-09-11 DIAGNOSIS — Z5112 Encounter for antineoplastic immunotherapy: Secondary | ICD-10-CM | POA: Diagnosis not present

## 2019-09-11 DIAGNOSIS — Z23 Encounter for immunization: Secondary | ICD-10-CM

## 2019-09-11 DIAGNOSIS — C187 Malignant neoplasm of sigmoid colon: Secondary | ICD-10-CM

## 2019-09-11 MED ORDER — HEPARIN SOD (PORK) LOCK FLUSH 100 UNIT/ML IV SOLN
500.0000 [IU] | Freq: Once | INTRAVENOUS | Status: AC | PRN
  Administered 2019-09-11: 500 [IU]
  Filled 2019-09-11: qty 5

## 2019-09-11 MED ORDER — SODIUM CHLORIDE 0.9% FLUSH
10.0000 mL | INTRAVENOUS | Status: DC | PRN
  Administered 2019-09-11: 10:00:00 10 mL
  Filled 2019-09-11: qty 10

## 2019-09-11 NOTE — Patient Instructions (Signed)

## 2019-09-11 NOTE — Progress Notes (Signed)
   Covid-19 Vaccination Clinic  Name:  Jeff [name]    MRN: [id number] DOB: [birth year]  09/11/2019  [name] was observed post Covid-19 immunization for 15 minutes without incident. He was provided with Vaccine Information Sheet and instruction to access the V-Safe system.   [name] was instructed to call 911 with any severe reactions post vaccine: Marland Kitchen Difficulty breathing  . Swelling of face and throat  . A fast heartbeat  . A bad rash all over body  . Dizziness and weakness   Immunizations Administered    Name Date Dose VIS Date Route   Pfizer COVID-19 Vaccine 09/11/2019 10:24 AM 0.3 mL 07/10/2018 Intramuscular   Manufacturer: [place]   Lot: [id number]   [place]: [id number]

## 2019-09-20 NOTE — Progress Notes (Signed)

## 2019-09-22 ENCOUNTER — Other Ambulatory Visit: Payer: Self-pay | Admitting: Oncology

## 2019-09-25 MED FILL — Dexamethasone Sodium Phosphate Inj 100 MG/10ML: INTRAMUSCULAR | Qty: 1 | Status: AC

## 2019-09-26 ENCOUNTER — Inpatient Hospital Stay: Payer: Medicaid Other | Attending: Oncology | Admitting: Nurse Practitioner

## 2019-09-26 ENCOUNTER — Encounter: Payer: Self-pay | Admitting: Nurse Practitioner

## 2019-09-26 ENCOUNTER — Inpatient Hospital Stay: Payer: Medicaid Other

## 2019-09-26 ENCOUNTER — Telehealth: Payer: Self-pay | Admitting: Nurse Practitioner

## 2019-09-26 ENCOUNTER — Telehealth: Payer: Self-pay | Admitting: *Deleted

## 2019-09-26 ENCOUNTER — Other Ambulatory Visit: Payer: Self-pay

## 2019-09-26 ENCOUNTER — Encounter: Payer: Self-pay | Admitting: Nutrition

## 2019-09-26 VITALS — BP 132/88 | HR 100 | Temp 98.0°F | Resp 17 | Ht 70.0 in | Wt 235.0 lb

## 2019-09-26 VITALS — BP 136/90 | HR 84

## 2019-09-26 DIAGNOSIS — Z5111 Encounter for antineoplastic chemotherapy: Secondary | ICD-10-CM | POA: Diagnosis present

## 2019-09-26 DIAGNOSIS — C187 Malignant neoplasm of sigmoid colon: Secondary | ICD-10-CM | POA: Diagnosis not present

## 2019-09-26 DIAGNOSIS — Z5112 Encounter for antineoplastic immunotherapy: Secondary | ICD-10-CM | POA: Insufficient documentation

## 2019-09-26 DIAGNOSIS — Z95828 Presence of other vascular implants and grafts: Secondary | ICD-10-CM

## 2019-09-26 LAB — CMP (CANCER CENTER ONLY)
ALT: 53 U/L — ABNORMAL HIGH (ref 0–44)
AST: 30 U/L (ref 15–41)
Albumin: 3.5 g/dL (ref 3.5–5.0)
Alkaline Phosphatase: 141 U/L — ABNORMAL HIGH (ref 38–126)
Anion gap: 15 (ref 5–15)
BUN: 14 mg/dL (ref 6–20)
CO2: 19 mmol/L — ABNORMAL LOW (ref 22–32)
Calcium: 9.1 mg/dL (ref 8.9–10.3)
Chloride: 100 mmol/L (ref 98–111)
Creatinine: 1.4 mg/dL — ABNORMAL HIGH (ref 0.61–1.24)
GFR, Est AFR Am: >60 mL/min (ref >60)
GFR, Estimated: 57 mL/min — ABNORMAL LOW (ref >60)
Glucose, Bld: 536 mg/dL — ABNORMAL HIGH (ref 70–99)
Potassium: 4.3 mmol/L (ref 3.5–5.1)
Sodium: 134 mmol/L — ABNORMAL LOW (ref 135–145)
Total Bilirubin: 0.4 mg/dL (ref 0.3–1.2)
Total Protein: 7.7 g/dL (ref 6.5–8.1)

## 2019-09-26 LAB — CBC WITH DIFFERENTIAL (CANCER CENTER ONLY)
Abs Immature Granulocytes: 0.02 10*3/uL (ref 0.00–0.07)
Basophils Absolute: 0 10*3/uL (ref 0.0–0.1)
Basophils Relative: 0 %
Eosinophils Absolute: 0.1 10*3/uL (ref 0.0–0.5)
Eosinophils Relative: 3 %
HCT: 43.8 % (ref 39.0–52.0)
Hemoglobin: 14.1 g/dL (ref 13.0–17.0)
Immature Granulocytes: 0 %
Lymphocytes Relative: 48 %
Lymphs Abs: 2.7 10*3/uL (ref 0.7–4.0)
MCH: 29.9 pg (ref 26.0–34.0)
MCHC: 32.2 g/dL (ref 30.0–36.0)
MCV: 92.8 fL (ref 80.0–100.0)
Monocytes Absolute: 0.5 10*3/uL (ref 0.1–1.0)
Monocytes Relative: 9 %
Neutro Abs: 2.3 10*3/uL (ref 1.7–7.7)
Neutrophils Relative %: 40 %
Platelet Count: 166 10*3/uL (ref 150–400)
RBC: 4.72 MIL/uL (ref 4.22–5.81)
RDW: 15.2 % (ref 11.5–15.5)
WBC Count: 5.7 10*3/uL (ref 4.0–10.5)
nRBC: 0 % (ref 0.0–0.2)

## 2019-09-26 MED ORDER — FLUOROURACIL CHEMO INJECTION 2.5 GM/50ML
400.0000 mg/m2 | Freq: Once | INTRAVENOUS | Status: AC
  Administered 2019-09-26: 900 mg via INTRAVENOUS
  Filled 2019-09-26: qty 18

## 2019-09-26 MED ORDER — DEXAMETHASONE SODIUM PHOSPHATE 10 MG/ML IJ SOLN
5.0000 mg | Freq: Once | INTRAMUSCULAR | Status: AC
  Administered 2019-09-26: 5 mg via INTRAVENOUS

## 2019-09-26 MED ORDER — SODIUM CHLORIDE 0.9 % IV SOLN
2000.0000 mg/m2 | INTRAVENOUS | Status: DC
  Administered 2019-09-26: 4550 mg via INTRAVENOUS
  Filled 2019-09-26: qty 91

## 2019-09-26 MED ORDER — SODIUM CHLORIDE 0.9 % IV SOLN: 5.0000 mg | Freq: Once | INTRAVENOUS | Status: DC

## 2019-09-26 MED ORDER — PALONOSETRON HCL INJECTION 0.25 MG/5ML
0.2500 mg | Freq: Once | INTRAVENOUS | Status: AC
  Administered 2019-09-26: 0.25 mg via INTRAVENOUS

## 2019-09-26 MED ORDER — ATROPINE SULFATE 1 MG/ML IJ SOLN
INTRAMUSCULAR | Status: AC
  Filled 2019-09-26: qty 1

## 2019-09-26 MED ORDER — PALONOSETRON HCL INJECTION 0.25 MG/5ML
INTRAVENOUS | Status: AC
  Filled 2019-09-26: qty 5

## 2019-09-26 MED ORDER — SODIUM CHLORIDE 0.9% FLUSH
10.0000 mL | INTRAVENOUS | Status: DC | PRN
  Administered 2019-09-26: 10 mL via INTRAVENOUS
  Filled 2019-09-26: qty 10

## 2019-09-26 MED ORDER — ATROPINE SULFATE 1 MG/ML IJ SOLN
0.5000 mg | Freq: Once | INTRAMUSCULAR | Status: AC | PRN
  Administered 2019-09-26: 0.5 mg via INTRAVENOUS

## 2019-09-26 MED ORDER — SODIUM CHLORIDE 0.9 % IV SOLN
180.0000 mg/m2 | Freq: Once | INTRAVENOUS | Status: AC
  Administered 2019-09-26: 420 mg via INTRAVENOUS
  Filled 2019-09-26: qty 21

## 2019-09-26 MED ORDER — SODIUM CHLORIDE 0.9 % IV SOLN
Freq: Once | INTRAVENOUS | Status: AC
  Filled 2019-09-26: qty 250

## 2019-09-26 MED ORDER — SODIUM CHLORIDE 0.9 % IV SOLN
400.0000 mg/m2 | Freq: Once | INTRAVENOUS | Status: AC
  Administered 2019-09-26: 912 mg via INTRAVENOUS
  Filled 2019-09-26: qty 45.6

## 2019-09-26 MED ORDER — PROMETHAZINE HCL 12.5 MG PO TABS: 12.5000 mg | ORAL_TABLET | Freq: Four times a day (QID) | ORAL | 2 refills | Status: DC | PRN

## 2019-09-26 MED ORDER — DEXAMETHASONE SODIUM PHOSPHATE 10 MG/ML IJ SOLN
INTRAMUSCULAR | Status: AC
  Filled 2019-09-26: qty 1

## 2019-09-26 MED ORDER — SODIUM CHLORIDE 0.9% FLUSH
10.0000 mL | INTRAVENOUS | Status: DC | PRN
  Filled 2019-09-26: qty 10

## 2019-09-26 MED ORDER — SODIUM CHLORIDE 0.9 % IV SOLN
5.0000 mg/kg | Freq: Once | INTRAVENOUS | Status: AC
  Administered 2019-09-26: 500 mg via INTRAVENOUS
  Filled 2019-09-26: qty 4

## 2019-09-26 NOTE — Telephone Encounter (Signed)
Per Ned Card NP, OK to treat with 102-111 heart rate

## 2019-09-26 NOTE — Progress Notes (Signed)
Provider requested diabetic education.  Noted CBG 536.  Patient is on Metformin.  This patient was instructed by RD several years ago on diabetic diet.  Reviewed no concentrated sweets and encourage patient to avoid excess sugar and carbohydrates around the days of chemotherapy especially if getting steroids.  Recommended artificial sweeteners however patient states he does not like these.  Encourage patient to contact the provider who manages his blood sugars and ask for evaluation for additional medication.  Patient is agreeable.  Patient was given a fact sheet.  Questions were answered.  Teach back method used.

## 2019-09-26 NOTE — Addendum Note (Signed)
Addended by: Owens Shark on: 09/26/2019 11:47 AM   Modules accepted: Orders

## 2019-09-26 NOTE — Progress Notes (Signed)
[facility] OFFICE PROGRESS NOTE   Diagnosis: Colon cancer  INTERVAL HISTORY:   Jeff [name] returns as scheduled.  He completed cycle 1 FOLFIRI bevacizumab 09/09/2019.  He had mild nausea for 2 to 3 days, vomited "a couple of times".  No mouth sores.  No diarrhea.  He denies bleeding.  No shortness of breath or chest pain.  No leg swelling or calf pain.  Objective:  Vital signs in last 24 hours:  Blood pressure 132/88, pulse 100, temperature 98 F (36.7 C), temperature source Temporal, resp. rate 17, height _0  (1.778 m), weight 235 lb (106.6 kg), SpO2 99 %.    HEENT: No thrush or ulcers. Resp: Lungs clear bilaterally. Cardio: Regular rate and rhythm. GI: Abdomen soft and nontender.  No hepatomegaly. Vascular: No leg edema.  Calves soft and nontender. Port-A-Cath without erythema.  Lab Results:  Lab Results  Component Value Date   WBC 5.7 09/26/2019   HGB 14.1 09/26/2019   HCT 43.8 09/26/2019   MCV 92.8 09/26/2019   PLT 166 09/26/2019   NEUTROABS 2.3 09/26/2019    Imaging:  No results found.  Medications: I have reviewed the patient's current medications.  Assessment/Plan: 1. 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 CTschest, abdomen, and pelvis on 12/11/2015-indeterminate tiny pulmonary nodules, multiple liver metastases, small nodes in the sigmoid mesocolon  3. Laparoscopic sigmoid colectomy, liver biopsy, Port-A-Cath placement 01/14/2016  4. Pathology sigmoid colon resection-colonic adenocarcinoma, 5.3 cm extending into pericolonic connective tissue, positive lymph-vascular invasion, positive perineural invasion, negative margins, metastatic carcinoma in9 of 28 lymph nodes  5. Pathology liver biopsy-metastatic colorectal adenocarcinoma  6. MSI stable; mismatch repair protein normal  7. 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 Cycle 1  FOLFOX 02/02/2016  9. Cycle 2 FOLFOX 02/15/2016  10. Cycle 3 FOLFOX 02/29/2016  11. Cycle 4 FOLFOX 03/14/2016  12. Cycle 5 FOLFOX 03/28/2016  13. Cycle 6 FOLFOX 04/11/2016 (oxaliplatin held secondary to thrombocytopenia)  14. 04/21/2016 restaging CTs-difficulty evaluating liver lesions due to hepatic steatosis. Stable right adrenal nodule. No adenopathy or local recurrence near the rectosigmoid anastomotic site.  15. Cycle 7 FOLFOX 04/25/2016  16. MRI liver 05/02/2016-partial improvement in hepatic metastases  17. Cycle 8 FOLFOX 05/10/2016  18. Cycle 9 FOLFOX 05/23/2016 (oxaliplatin held due to thrombocytopenia)  19. Cycle 10 FOLFOX 06/06/2016 (oxaliplatin dose reduced due to thrombocytopenia)  20. Cycle 11 FOLFOX 06/27/2016 (oxaliplatin held due to neuropathy)  21. Cycle 12 FOLFOX 07/11/2016 (oxaliplatin held) 22. Initiation of maintenance Xeloda 7 days on/7 days off 07/27/2016 23. MRI liver 11/18/2016-decrease in hepatic metastatic disease. No new or progressive disease identified within the abdomen. 24. Continuation of Xeloda 7 days on/7 days off 25. MRI liver 04/27/2017-previous liver lesions not identified, no new lesions, no lymphadenopathy 26. Xeloda continued 7 days on/7 days off 27. MRI liver 12/04/2017 -no evidence of metastatic disease, hepatic steatosis 28. Xeloda continued 7 days on/7 days off 29. MRI liver 07/15/2018-no evidence of metastatic disease. Stable severe hepatic steatosis. 30. Xeloda continued 7 days on/7 days off 31. MRI liver 03/16/2019-hepatic steatosis, no liver mass, focal area of intrahepatic biliary dilatation in segments 2 and 3 of the left lobe-increased 32. Xeloda continued 7 days on/7 days off 33. MRI abdomen 08/19/2019-no findings to suggest liver metastases.  Bilateral lung nodules measuring up to 2.8 cm, progressive and more conspicuous than on previous exam 34. CT chest 08/29/2019-multiple pulmonary metastases, new from 04/21/2016  35. Cycle 1  FOLFIRI/bevacizumab 09/09/2019 36. Cycle 2 FOLFIRI/bevacizumab 09/26/2019  2. Rectal bleeding and constipation secondary to #1  3. History of peripheral vascular disease, status post left lower extremity vascular bypass surgery in April 2017  4. History of nephrolithiasis  5. History of Graves' disease treated with radioactive iodine  6. Anxiety/depression  7. Hypertension  8. Hospitalization 01/19/2016 with wound dehiscence status post secondary suture closure of abdominal wall  9. 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 Hyperglycemia 06/20/2016-diagnosed with diabetes, maintained on insulin  11.Positive COVID test 12/13/2018   Disposition: Jeff [name] appears stable.  He has completed 1 cycle of FOLFIRI/bevacizumab.  He had mild nausea, otherwise tolerated well.  Plan to proceed with cycle 2 today as scheduled.  We are adjusting the dexamethasone premedication dose from 10 mg to 5 mg due to hyperglycemia.  We reviewed the CBC and chemistry panel from today.  Labs are adequate to proceed with treatment.  Elevated glucose noted, dexamethasone adjusted as above.  We will repeat a fingerstick glucose in approximately 2 hours, insulin if needed.  His wife will contact PCP regarding elevated readings.  We discussed his diet and maintaining adequate fluid intake.  He will return for lab, follow-up, cycle 3 FOLFIRI/bevacizumab in 2 weeks.  He will contact the office in the interim with any problems.  Plan reviewed with Dr. Benay Spice.    Ned Card ANP/GNP-BC   09/26/2019  11:12 AM

## 2019-09-26 NOTE — Telephone Encounter (Signed)
Scheduled appt per 5/13 los - gave wife AVS and calender

## 2019-09-26 NOTE — Patient Instructions (Signed)
[facility] Discharge Instructions for Patients Receiving Chemotherapy  Today you received the following chemotherapy agents: Avastin, Irinotecan, Leucovorin, 5FU  To help prevent nausea and vomiting after your treatment, we encourage you to take your nausea medication as directed.    If you develop nausea and vomiting that is not controlled by your nausea medication, call the clinic.   BELOW ARE SYMPTOMS THAT SHOULD BE REPORTED IMMEDIATELY:  *FEVER GREATER THAN 100.5 F  *CHILLS WITH OR WITHOUT FEVER  NAUSEA AND VOMITING THAT IS NOT CONTROLLED WITH YOUR NAUSEA MEDICATION  *UNUSUAL SHORTNESS OF BREATH  *UNUSUAL BRUISING OR BLEEDING  TENDERNESS IN MOUTH AND THROAT WITH OR WITHOUT PRESENCE OF ULCERS  *URINARY PROBLEMS  *BOWEL PROBLEMS  UNUSUAL RASH Items with * indicate a potential emergency and should be followed up as soon as possible.  Feel free to call the clinic should you have any questions or concerns. The clinic phone number is [phone].  Please show the [place] at check-in to the Emergency Department and triage nurse.  Bevacizumab injection What is this medicine? BEVACIZUMAB (be va SIZ yoo mab) is a monoclonal antibody. It is used to treat many types of cancer. This medicine may be used for other purposes; ask your health care provider or pharmacist if you have questions. COMMON BRAND NAME(S): Avastin, MVASI, Zirabev What should I tell my health care provider before I take this medicine? They need to know if you have any of these conditions:  diabetes  heart disease  high blood pressure  history of coughing up blood  prior anthracycline chemotherapy (e.g., doxorubicin, daunorubicin, epirubicin)  recent or ongoing radiation therapy  recent or planning to have surgery  stroke  an unusual or allergic reaction to bevacizumab, hamster proteins, mouse proteins, other medicines, foods, dyes, or preservatives  pregnant or trying  to get pregnant  breast-feeding How should I use this medicine? This medicine is for infusion into a vein. It is given by a health care professional in a hospital or clinic setting. Talk to your pediatrician regarding the use of this medicine in children. Special care may be needed. Overdosage: If you think you have taken too much of this medicine contact a poison control center or emergency room at once. NOTE: This medicine is only for you. Do not share this medicine with others. What if I miss a dose? It is important not to miss your dose. Call your doctor or health care professional if you are unable to keep an appointment. What may interact with this medicine? Interactions are not expected. This list may not describe all possible interactions. Give your health care provider a list of all the medicines, herbs, non-prescription drugs, or dietary supplements you use. Also tell them if you smoke, drink alcohol, or use illegal drugs. Some items may interact with your medicine. What should I watch for while using this medicine? Your condition will be monitored carefully while you are receiving this medicine. You will need important blood work and urine testing done while you are taking this medicine. This medicine may increase your risk to bruise or bleed. Call your doctor or health care professional if you notice any unusual bleeding. Before having surgery, talk to your health care provider to make sure it is ok. This drug can increase the risk of poor healing of your surgical site or wound. You will need to stop this drug for 28 days before surgery. After surgery, wait at least 28 days before restarting this drug.  Make sure the surgical site or wound is healed enough before restarting this drug. Talk to your health care provider if questions. Do not become pregnant while taking this medicine or for 6 months after stopping it. Women should inform their doctor if they wish to become pregnant or think  they might be pregnant. There is a potential for serious side effects to an unborn child. Talk to your health care professional or pharmacist for more information. Do not breast-feed an infant while taking this medicine and for 6 months after the last dose. This medicine has caused ovarian failure in some women. This medicine may interfere with the ability to have a child. You should talk to your doctor or health care professional if you are concerned about your fertility. What side effects may I notice from receiving this medicine? Side effects that you should report to your doctor or health care professional as soon as possible:  allergic reactions like skin rash, itching or hives, swelling of the face, lips, or tongue  chest pain or chest tightness  chills  coughing up blood  high fever  seizures  severe constipation  signs and symptoms of bleeding such as bloody or black, tarry stools; red or dark-brown urine; spitting up blood or brown material that looks like coffee grounds; red spots on the skin; unusual bruising or bleeding from the eye, gums, or nose  signs and symptoms of a blood clot such as breathing problems; chest pain; severe, sudden headache; pain, swelling, warmth in the leg  signs and symptoms of a stroke like changes in vision; confusion; trouble speaking or understanding; severe headaches; sudden numbness or weakness of the face, arm or leg; trouble walking; dizziness; loss of balance or coordination  stomach pain  sweating  swelling of legs or ankles  vomiting  weight gain Side effects that usually do not require medical attention (report to your doctor or health care professional if they continue or are bothersome):  back pain  changes in taste  decreased appetite  dry skin  nausea  tiredness This list may not describe all possible side effects. Call your doctor for medical advice about side effects. You may report side effects to FDA at  1-800-FDA-1088. Where should I keep my medicine? This drug is given in a hospital or clinic and will not be stored at home. NOTE: This sheet is a summary. It may not cover all possible information. If you have questions about this medicine, talk to your doctor, pharmacist, or health care provider.  2020 Elsevier/Gold Standard (2019-02-27 10:50:46)  Irinotecan injection What is this medicine? IRINOTECAN (ir in oh TEE kan ) is a chemotherapy drug. It is used to treat colon and rectal cancer. This medicine may be used for other purposes; ask your health care provider or pharmacist if you have questions. COMMON BRAND NAME(S): Camptosar What should I tell my health care provider before I take this medicine? They need to know if you have any of these conditions:  dehydration  diarrhea  infection (especially a virus infection such as chickenpox, cold sores, or herpes)  liver disease  low blood counts, like low white cell, platelet, or red cell counts  low levels of calcium, magnesium, or potassium in the blood  recent or ongoing radiation therapy  an unusual or allergic reaction to irinotecan, other medicines, foods, dyes, or preservatives  pregnant or trying to get pregnant  breast-feeding How should I use this medicine? This drug is given as an infusion into a  vein. It is administered in a hospital or clinic by a specially trained health care professional. Talk to your pediatrician regarding the use of this medicine in children. Special care may be needed. Overdosage: If you think you have taken too much of this medicine contact a poison control center or emergency room at once. NOTE: This medicine is only for you. Do not share this medicine with others. What if I miss a dose? It is important not to miss your dose. Call your doctor or health care professional if you are unable to keep an appointment. What may interact with this medicine? This medicine may interact with the  following medications:  antiviral medicines for HIV or AIDS  certain antibiotics like rifampin or rifabutin  certain medicines for fungal infections like itraconazole, ketoconazole, posaconazole, and voriconazole  certain medicines for seizures like carbamazepine, phenobarbital, phenotoin  clarithromycin  gemfibrozil  nefazodone  St. John's Wort This list may not describe all possible interactions. Give your health care provider a list of all the medicines, herbs, non-prescription drugs, or dietary supplements you use. Also tell them if you smoke, drink alcohol, or use illegal drugs. Some items may interact with your medicine. What should I watch for while using this medicine? Your condition will be monitored carefully while you are receiving this medicine. You will need important blood work done while you are taking this medicine. This drug may make you feel generally unwell. This is not uncommon, as chemotherapy can affect healthy cells as well as cancer cells. Report any side effects. Continue your course of treatment even though you feel ill unless your doctor tells you to stop. In some cases, you may be given additional medicines to help with side effects. Follow all directions for their use. You may get drowsy or dizzy. Do not drive, use machinery, or do anything that needs mental alertness until you know how this medicine affects you. Do not stand or sit up quickly, especially if you are an older patient. This reduces the risk of dizzy or fainting spells. Call your health care professional for advice if you get a fever, chills, or sore throat, or other symptoms of a cold or flu. Do not treat yourself. This medicine decreases your body's ability to fight infections. Try to avoid being around people who are sick. Avoid taking products that contain aspirin, acetaminophen, ibuprofen, naproxen, or ketoprofen unless instructed by your doctor. These medicines may hide a fever. This medicine  may increase your risk to bruise or bleed. Call your doctor or health care professional if you notice any unusual bleeding. Be careful brushing and flossing your teeth or using a toothpick because you may get an infection or bleed more easily. If you have any dental work done, tell your dentist you are receiving this medicine. Do not become pregnant while taking this medicine or for 6 months after stopping it. Women should inform their health care professional if they wish to become pregnant or think they might be pregnant. Men should not father a child while taking this medicine and for 3 months after stopping it. There is potential for serious side effects to an unborn child. Talk to your health care professional for more information. Do not breast-feed an infant while taking this medicine or for 7 days after stopping it. This medicine has caused ovarian failure in some women. This medicine may make it more difficult to get pregnant. Talk to your health care professional if you are concerned about your fertility. This medicine  has caused decreased sperm counts in some men. This may make it more difficult to father a child. Talk to your health care professional if you are concerned about your fertility. What side effects may I notice from receiving this medicine? Side effects that you should report to your doctor or health care professional as soon as possible:  allergic reactions like skin rash, itching or hives, swelling of the face, lips, or tongue  chest pain  diarrhea  flushing, runny nose, sweating during infusion  low blood counts - this medicine may decrease the number of white blood cells, red blood cells and platelets. You may be at increased risk for infections and bleeding.  nausea, vomiting  pain, swelling, warmth in the leg  signs of decreased platelets or bleeding - bruising, pinpoint red spots on the skin, black, tarry stools, blood in the urine  signs of infection - fever  or chills, cough, sore throat, pain or difficulty passing urine  signs of decreased red blood cells - unusually weak or tired, fainting spells, lightheadedness Side effects that usually do not require medical attention (report to your doctor or health care professional if they continue or are bothersome):  constipation  hair loss  headache  loss of appetite  mouth sores  stomach pain This list may not describe all possible side effects. Call your doctor for medical advice about side effects. You may report side effects to FDA at 1-800-FDA-1088. Where should I keep my medicine? This drug is given in a hospital or clinic and will not be stored at home. NOTE: This sheet is a summary. It may not cover all possible information. If you have questions about this medicine, talk to your doctor, pharmacist, or health care provider.  2020 Elsevier/Gold Standard (2018-06-22 10:09:17)

## 2019-09-26 NOTE — Progress Notes (Signed)
Pt's HR 100-111, ok to treat per L. Thomas NP  CBG is 336.

## 2019-09-27 LAB — GLUCOSE, CAPILLARY: Glucose-Capillary: 336 mg/dL — ABNORMAL HIGH (ref 70–99)

## 2019-09-28 ENCOUNTER — Inpatient Hospital Stay: Payer: Medicaid Other

## 2019-09-28 ENCOUNTER — Other Ambulatory Visit: Payer: Self-pay

## 2019-09-28 VITALS — BP 127/89 | HR 107 | Temp 98.5°F | Resp 20

## 2019-09-28 DIAGNOSIS — Z5112 Encounter for antineoplastic immunotherapy: Secondary | ICD-10-CM | POA: Diagnosis not present

## 2019-09-28 DIAGNOSIS — Z95828 Presence of other vascular implants and grafts: Secondary | ICD-10-CM

## 2019-09-28 MED ORDER — HEPARIN SOD (PORK) LOCK FLUSH 100 UNIT/ML IV SOLN
500.0000 [IU] | Freq: Once | INTRAVENOUS | Status: AC | PRN
  Administered 2019-09-28: 500 [IU] via INTRAVENOUS
  Filled 2019-09-28: qty 5

## 2019-09-28 MED ORDER — SODIUM CHLORIDE 0.9% FLUSH
10.0000 mL | INTRAVENOUS | Status: DC | PRN
  Administered 2019-09-28: 10 mL via INTRAVENOUS
  Filled 2019-09-28: qty 10

## 2019-10-04 NOTE — Progress Notes (Signed)
Pharmacist Chemotherapy Monitoring - Follow Up Assessment    I verify that I have reviewed each item in the below checklist:  . Regimen for the patient is scheduled for the appropriate day and plan matches scheduled date. Marland Kitchen Appropriate non-routine labs are ordered dependent on drug ordered. . If applicable, additional medications reviewed and ordered per protocol based on lifetime cumulative doses and/or treatment regimen.   Plan for follow-up and/or issues identified: No . I-vent associated with next due treatment: No . MD and/or nursing notified: No  Britt Boozer 10/04/2019 10:08 AM

## 2019-10-06 ENCOUNTER — Other Ambulatory Visit: Payer: Self-pay | Admitting: Oncology

## 2019-10-10 ENCOUNTER — Inpatient Hospital Stay: Payer: Medicaid Other

## 2019-10-10 ENCOUNTER — Other Ambulatory Visit: Payer: Self-pay

## 2019-10-10 ENCOUNTER — Inpatient Hospital Stay (HOSPITAL_BASED_OUTPATIENT_CLINIC_OR_DEPARTMENT_OTHER): Payer: Medicaid Other | Admitting: Nurse Practitioner

## 2019-10-10 ENCOUNTER — Encounter: Payer: Self-pay | Admitting: Nurse Practitioner

## 2019-10-10 VITALS — BP 129/101 | HR 83

## 2019-10-10 VITALS — BP 131/94 | HR 90 | Temp 97.7°F | Resp 18 | Ht 70.0 in | Wt 235.5 lb

## 2019-10-10 DIAGNOSIS — C187 Malignant neoplasm of sigmoid colon: Secondary | ICD-10-CM

## 2019-10-10 DIAGNOSIS — C189 Malignant neoplasm of colon, unspecified: Secondary | ICD-10-CM | POA: Diagnosis not present

## 2019-10-10 DIAGNOSIS — C787 Secondary malignant neoplasm of liver and intrahepatic bile duct: Secondary | ICD-10-CM | POA: Diagnosis not present

## 2019-10-10 DIAGNOSIS — Z5112 Encounter for antineoplastic immunotherapy: Secondary | ICD-10-CM | POA: Diagnosis not present

## 2019-10-10 DIAGNOSIS — Z95828 Presence of other vascular implants and grafts: Secondary | ICD-10-CM

## 2019-10-10 LAB — CBC WITH DIFFERENTIAL (CANCER CENTER ONLY)
Abs Immature Granulocytes: 0.02 10*3/uL (ref 0.00–0.07)
Basophils Absolute: 0 10*3/uL (ref 0.0–0.1)
Basophils Relative: 0 %
Eosinophils Absolute: 0.2 10*3/uL (ref 0.0–0.5)
Eosinophils Relative: 2 %
HCT: 44.3 % (ref 39.0–52.0)
Hemoglobin: 14.3 g/dL (ref 13.0–17.0)
Immature Granulocytes: 0 %
Lymphocytes Relative: 40 %
Lymphs Abs: 3 10*3/uL (ref 0.7–4.0)
MCH: 29.3 pg (ref 26.0–34.0)
MCHC: 32.3 g/dL (ref 30.0–36.0)
MCV: 90.8 fL (ref 80.0–100.0)
Monocytes Absolute: 0.6 10*3/uL (ref 0.1–1.0)
Monocytes Relative: 8 %
Neutro Abs: 3.6 10*3/uL (ref 1.7–7.7)
Neutrophils Relative %: 50 %
Platelet Count: 138 10*3/uL — ABNORMAL LOW (ref 150–400)
RBC: 4.88 MIL/uL (ref 4.22–5.81)
RDW: 15 % (ref 11.5–15.5)
WBC Count: 7.4 10*3/uL (ref 4.0–10.5)
nRBC: 0 % (ref 0.0–0.2)

## 2019-10-10 LAB — TOTAL PROTEIN, URINE DIPSTICK: Protein, ur: NEGATIVE mg/dL

## 2019-10-10 LAB — CMP (CANCER CENTER ONLY)
ALT: 59 U/L — ABNORMAL HIGH (ref 0–44)
AST: 42 U/L — ABNORMAL HIGH (ref 15–41)
Albumin: 3.5 g/dL (ref 3.5–5.0)
Alkaline Phosphatase: 137 U/L — ABNORMAL HIGH (ref 38–126)
Anion gap: 15 (ref 5–15)
BUN: 14 mg/dL (ref 6–20)
CO2: 20 mmol/L — ABNORMAL LOW (ref 22–32)
Calcium: 8.5 mg/dL — ABNORMAL LOW (ref 8.9–10.3)
Chloride: 99 mmol/L (ref 98–111)
Creatinine: 1.47 mg/dL — ABNORMAL HIGH (ref 0.61–1.24)
GFR, Est AFR Am: >60 mL/min (ref >60)
GFR, Estimated: 54 mL/min — ABNORMAL LOW (ref >60)
Glucose, Bld: 428 mg/dL — ABNORMAL HIGH (ref 70–99)
Potassium: 4.1 mmol/L (ref 3.5–5.1)
Sodium: 134 mmol/L — ABNORMAL LOW (ref 135–145)
Total Bilirubin: 0.4 mg/dL (ref 0.3–1.2)
Total Protein: 7.8 g/dL (ref 6.5–8.1)

## 2019-10-10 LAB — CEA (IN HOUSE-CHCC): CEA (CHCC-In House): 21.93 ng/mL — ABNORMAL HIGH (ref 0.00–5.00)

## 2019-10-10 MED ORDER — PALONOSETRON HCL INJECTION 0.25 MG/5ML
0.2500 mg | Freq: Once | INTRAVENOUS | Status: AC
  Administered 2019-10-10: 0.25 mg via INTRAVENOUS

## 2019-10-10 MED ORDER — FLUOROURACIL CHEMO INJECTION 2.5 GM/50ML
400.0000 mg/m2 | Freq: Once | INTRAVENOUS | Status: AC
  Administered 2019-10-10: 900 mg via INTRAVENOUS
  Filled 2019-10-10: qty 18

## 2019-10-10 MED ORDER — SODIUM CHLORIDE 0.9 % IV SOLN
5.0000 mg/kg | Freq: Once | INTRAVENOUS | Status: AC
  Administered 2019-10-10: 500 mg via INTRAVENOUS
  Filled 2019-10-10: qty 16

## 2019-10-10 MED ORDER — SODIUM CHLORIDE 0.9 % IV SOLN
2000.0000 mg/m2 | INTRAVENOUS | Status: DC
  Administered 2019-10-10: 4550 mg via INTRAVENOUS
  Filled 2019-10-10: qty 91

## 2019-10-10 MED ORDER — DEXAMETHASONE SODIUM PHOSPHATE 10 MG/ML IJ SOLN
5.0000 mg | Freq: Once | INTRAMUSCULAR | Status: AC
  Administered 2019-10-10: 5 mg via INTRAVENOUS

## 2019-10-10 MED ORDER — PALONOSETRON HCL INJECTION 0.25 MG/5ML
INTRAVENOUS | Status: AC
  Filled 2019-10-10: qty 5

## 2019-10-10 MED ORDER — SODIUM CHLORIDE 0.9 % IV SOLN
400.0000 mg/m2 | Freq: Once | INTRAVENOUS | Status: AC
  Administered 2019-10-10: 912 mg via INTRAVENOUS
  Filled 2019-10-10: qty 45.6

## 2019-10-10 MED ORDER — SODIUM CHLORIDE 0.9 % IV SOLN
5.0000 mg | Freq: Once | INTRAVENOUS | Status: DC
  Filled 2019-10-10: qty 0.5

## 2019-10-10 MED ORDER — DEXAMETHASONE SODIUM PHOSPHATE 10 MG/ML IJ SOLN
INTRAMUSCULAR | Status: AC
  Filled 2019-10-10: qty 1

## 2019-10-10 MED ORDER — ATROPINE SULFATE 1 MG/ML IJ SOLN
0.5000 mg | Freq: Once | INTRAMUSCULAR | Status: AC | PRN
  Administered 2019-10-10: 0.5 mg via INTRAVENOUS

## 2019-10-10 MED ORDER — SODIUM CHLORIDE 0.9% FLUSH
10.0000 mL | INTRAVENOUS | Status: DC | PRN
  Administered 2019-10-10: 10 mL via INTRAVENOUS
  Filled 2019-10-10: qty 10

## 2019-10-10 MED ORDER — HEPARIN SOD (PORK) LOCK FLUSH 100 UNIT/ML IV SOLN
500.0000 [IU] | Freq: Once | INTRAVENOUS | Status: DC | PRN
  Filled 2019-10-10: qty 5

## 2019-10-10 MED ORDER — ATROPINE SULFATE 1 MG/ML IJ SOLN
INTRAMUSCULAR | Status: AC
  Filled 2019-10-10: qty 1

## 2019-10-10 MED ORDER — SODIUM CHLORIDE 0.9% FLUSH
10.0000 mL | INTRAVENOUS | Status: DC | PRN
  Filled 2019-10-10: qty 10

## 2019-10-10 MED ORDER — SODIUM CHLORIDE 0.9 % IV SOLN
180.0000 mg/m2 | Freq: Once | INTRAVENOUS | Status: AC
  Administered 2019-10-10: 420 mg via INTRAVENOUS
  Filled 2019-10-10: qty 15

## 2019-10-10 MED ORDER — SODIUM CHLORIDE 0.9 % IV SOLN
Freq: Once | INTRAVENOUS | Status: AC
  Filled 2019-10-10: qty 250

## 2019-10-10 NOTE — Progress Notes (Signed)
[facility] OFFICE PROGRESS NOTE   Diagnosis: Colon cancer  INTERVAL HISTORY:   Jeff [name] returns as scheduled.  He completed cycle 2 FOLFIRI/bevacizumab 09/26/2019.  He had mild nausea/vomiting on day 3.  No mouth sores.  No diarrhea.  He denies bleeding.  No shortness of breath or chest pain.  No leg swelling or calf pain.  Levemir dose has been increased.  Wife reports blood sugars running in the upper 200s.  Objective:  Vital signs in last 24 hours:  Blood pressure (!) 131/94, pulse 90, temperature 97.7 F (36.5 C), temperature source Tympanic, resp. rate 18, height 5' 10" (1.778 m), weight 235 lb 8 oz (106.8 kg), SpO2 100 %.    HEENT: No thrush or ulcers. Resp: Lungs clear bilaterally. Cardio: Regular rate and rhythm. GI: Abdomen soft and nontender.  No hepatomegaly. Vascular: No leg edema. Skin: Palms without erythema. Port-A-Cath without erythema.   Lab Results:  Lab Results  Component Value Date   WBC 7.4 10/10/2019   HGB 14.3 10/10/2019   HCT 44.3 10/10/2019   MCV 90.8 10/10/2019   PLT 138 (L) 10/10/2019   NEUTROABS 3.6 10/10/2019    Imaging:  No results found.  Medications: I have reviewed the patient's current medications.  Assessment/Plan: 1. 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 CTschest, abdomen, and pelvis on 12/11/2015-indeterminate tiny pulmonary nodules, multiple liver metastases, small nodes in the sigmoid mesocolon  3. Laparoscopic sigmoid colectomy, liver biopsy, Port-A-Cath placement 01/14/2016  4. Pathology sigmoid colon resection-colonic adenocarcinoma, 5.3 cm extending into pericolonic connective tissue, positive lymph-vascular invasion, positive perineural invasion, negative margins, metastatic carcinoma in9 of 28 lymph nodes  5. Pathology liver biopsy-metastatic colorectal adenocarcinoma  6. MSI stable; mismatch repair  protein normal  7. 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 Cycle 1 FOLFOX 02/02/2016  9. Cycle 2 FOLFOX 02/15/2016  10. Cycle 3 FOLFOX 02/29/2016  11. Cycle 4 FOLFOX 03/14/2016  12. Cycle 5 FOLFOX 03/28/2016  13. Cycle 6 FOLFOX 04/11/2016 (oxaliplatin held secondary to thrombocytopenia)  14. 04/21/2016 restaging CTs-difficulty evaluating liver lesions due to hepatic steatosis. Stable right adrenal nodule. No adenopathy or local recurrence near the rectosigmoid anastomotic site.  15. Cycle 7 FOLFOX 04/25/2016  16. MRI liver 05/02/2016-partial improvement in hepatic metastases  17. Cycle 8 FOLFOX 05/10/2016  18. Cycle 9 FOLFOX 05/23/2016 (oxaliplatin held due to thrombocytopenia)  19. Cycle 10 FOLFOX 06/06/2016 (oxaliplatin dose reduced due to thrombocytopenia)  20. Cycle 11 FOLFOX 06/27/2016 (oxaliplatin held due to neuropathy)  21. Cycle 12 FOLFOX 07/11/2016 (oxaliplatin held) 22. Initiation of maintenance Xeloda 7 days on/7 days off 07/27/2016 23. MRI liver 11/18/2016-decrease in hepatic metastatic disease. No new or progressive disease identified within the abdomen. 24. Continuation of Xeloda 7 days on/7 days off 25. MRI liver 04/27/2017-previous liver lesions not identified, no new lesions, no lymphadenopathy 26. Xeloda continued 7 days on/7 days off 27. MRI liver 12/04/2017 -no evidence of metastatic disease, hepatic steatosis 28. Xeloda continued 7 days on/7 days off 29. MRI liver 07/15/2018-no evidence of metastatic disease. Stable severe hepatic steatosis. 30. Xeloda continued 7 days on/7 days off 31. MRI liver 03/16/2019-hepatic steatosis, no liver mass, focal area of intrahepatic biliary dilatation in segments 2 and 3 of the left lobe-increased 32. Xeloda continued 7 days on/7 days off 33. MRI abdomen 08/19/2019-no findings to suggest liver metastases. Bilateral lung nodules measuring up to 2.8 cm, progressive and more conspicuous than on   previous exam 34. CT chest  08/29/2019-multiple pulmonary metastases, new from 04/21/2016 35. Cycle 1 FOLFIRI/bevacizumab 09/09/2019 36. Cycle 2 FOLFIRI/bevacizumab 09/26/2019  37. Cycle 3 FOLFIRI/bevacizumab 10/10/2019  2. Rectal bleeding and constipation secondary to #1  3. History of peripheral vascular disease, status post left lower extremity vascular bypass surgery in April 2017  4. History of nephrolithiasis  5. History of Graves' disease treated with radioactive iodine  6. Anxiety/depression  7. Hypertension  8. Hospitalization 01/19/2016 with wound dehiscence status post secondary suture closure of abdominal wall  9. 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 Hyperglycemia 06/20/2016-diagnosed with diabetes, maintained on insulin  11.Positive COVID test 12/13/2018  Disposition: Jeff [name] appears stable.  He has completed 2 cycles of FOLFIRI/bevacizumab.  Aside from mild nausea he is tolerating treatment well.  He does not feel we need to make any adjustments to the antiemetic regimen.  Plan to proceed with cycle 3 FOLFIRI/bevacizumab today as scheduled.  We will follow-up on the CEA from today.  We reviewed the CBC from today.  Counts adequate to proceed with treatment.  Urine negative for protein.  He will return for lab, follow-up, cycle 4 FOLFIRI/bevacizumab in 2 weeks.  He will contact the office in the interim with any problems.  Ned Card ANP/GNP-BC   10/10/2019  9:47 AM

## 2019-10-12 ENCOUNTER — Other Ambulatory Visit: Payer: Self-pay

## 2019-10-12 ENCOUNTER — Inpatient Hospital Stay: Payer: Medicaid Other

## 2019-10-12 VITALS — BP 129/81 | HR 106 | Temp 97.7°F | Resp 18

## 2019-10-12 DIAGNOSIS — C187 Malignant neoplasm of sigmoid colon: Secondary | ICD-10-CM

## 2019-10-12 DIAGNOSIS — Z5112 Encounter for antineoplastic immunotherapy: Secondary | ICD-10-CM | POA: Diagnosis not present

## 2019-10-12 MED ORDER — HEPARIN SOD (PORK) LOCK FLUSH 100 UNIT/ML IV SOLN
500.0000 [IU] | Freq: Once | INTRAVENOUS | Status: AC | PRN
  Administered 2019-10-12: 500 [IU]
  Filled 2019-10-12: qty 5

## 2019-10-12 MED ORDER — SODIUM CHLORIDE 0.9% FLUSH
10.0000 mL | INTRAVENOUS | Status: DC | PRN
  Administered 2019-10-12: 10 mL
  Filled 2019-10-12: qty 10

## 2019-10-12 NOTE — Patient Instructions (Signed)

## 2019-10-15 ENCOUNTER — Telehealth: Payer: Self-pay | Admitting: *Deleted

## 2019-10-15 MED ORDER — FLUCONAZOLE 100 MG PO TABS: 100.0000 mg | ORAL_TABLET | Freq: Every day | ORAL | 0 refills | Status: DC

## 2019-10-15 NOTE — Telephone Encounter (Addendum)
Wife reports Cordarious said his tongue hurts and upon inspection it is coated white. Also PCP office tried to draw labs on him today without success. Can the bring in or fax script from PCP to have labs drawn while here on 10/24/19? Per Ned Card, NP: Fluconazole 100 mg daily x 4 days. Wife notified via voice mail.

## 2019-10-18 NOTE — Progress Notes (Signed)
Pharmacist Chemotherapy Monitoring - Follow Up Assessment    I verify that I have reviewed each item in the below checklist:  . Regimen for the patient is scheduled for the appropriate day and plan matches scheduled date. Marland Kitchen Appropriate non-routine labs are ordered dependent on drug ordered. . If applicable, additional medications reviewed and ordered per protocol based on lifetime cumulative doses and/or treatment regimen.   Plan for follow-up and/or issues identified: No . I-vent associated with next due treatment: No . MD and/or nursing notified: No  Jeff [name] 10/18/2019 10:45 AM

## 2019-10-20 ENCOUNTER — Other Ambulatory Visit: Payer: Self-pay | Admitting: Oncology

## 2019-10-24 ENCOUNTER — Inpatient Hospital Stay: Payer: Medicaid Other | Attending: Oncology

## 2019-10-24 ENCOUNTER — Other Ambulatory Visit: Payer: Self-pay

## 2019-10-24 ENCOUNTER — Encounter: Payer: Self-pay | Admitting: Nurse Practitioner

## 2019-10-24 ENCOUNTER — Inpatient Hospital Stay: Payer: Medicaid Other

## 2019-10-24 ENCOUNTER — Telehealth: Payer: Self-pay

## 2019-10-24 ENCOUNTER — Inpatient Hospital Stay (HOSPITAL_BASED_OUTPATIENT_CLINIC_OR_DEPARTMENT_OTHER): Payer: Medicaid Other | Admitting: Nurse Practitioner

## 2019-10-24 ENCOUNTER — Telehealth: Payer: Self-pay | Admitting: Oncology

## 2019-10-24 VITALS — BP 135/95 | HR 99 | Temp 97.5°F | Resp 18 | Ht 70.0 in | Wt 231.9 lb

## 2019-10-24 DIAGNOSIS — Z87442 Personal history of urinary calculi: Secondary | ICD-10-CM | POA: Diagnosis not present

## 2019-10-24 DIAGNOSIS — F329 Major depressive disorder, single episode, unspecified: Secondary | ICD-10-CM | POA: Insufficient documentation

## 2019-10-24 DIAGNOSIS — C787 Secondary malignant neoplasm of liver and intrahepatic bile duct: Secondary | ICD-10-CM

## 2019-10-24 DIAGNOSIS — C189 Malignant neoplasm of colon, unspecified: Secondary | ICD-10-CM

## 2019-10-24 DIAGNOSIS — Z794 Long term (current) use of insulin: Secondary | ICD-10-CM | POA: Insufficient documentation

## 2019-10-24 DIAGNOSIS — C187 Malignant neoplasm of sigmoid colon: Secondary | ICD-10-CM | POA: Insufficient documentation

## 2019-10-24 DIAGNOSIS — R110 Nausea: Secondary | ICD-10-CM | POA: Diagnosis not present

## 2019-10-24 DIAGNOSIS — Z8616 Personal history of COVID-19: Secondary | ICD-10-CM | POA: Insufficient documentation

## 2019-10-24 DIAGNOSIS — D6959 Other secondary thrombocytopenia: Secondary | ICD-10-CM | POA: Insufficient documentation

## 2019-10-24 DIAGNOSIS — I10 Essential (primary) hypertension: Secondary | ICD-10-CM | POA: Diagnosis not present

## 2019-10-24 DIAGNOSIS — Z5112 Encounter for antineoplastic immunotherapy: Secondary | ICD-10-CM | POA: Insufficient documentation

## 2019-10-24 DIAGNOSIS — E119 Type 2 diabetes mellitus without complications: Secondary | ICD-10-CM | POA: Insufficient documentation

## 2019-10-24 DIAGNOSIS — Z79899 Other long term (current) drug therapy: Secondary | ICD-10-CM | POA: Insufficient documentation

## 2019-10-24 DIAGNOSIS — Z95828 Presence of other vascular implants and grafts: Secondary | ICD-10-CM

## 2019-10-24 DIAGNOSIS — Z5111 Encounter for antineoplastic chemotherapy: Secondary | ICD-10-CM | POA: Diagnosis present

## 2019-10-24 DIAGNOSIS — F419 Anxiety disorder, unspecified: Secondary | ICD-10-CM | POA: Diagnosis not present

## 2019-10-24 LAB — CBC WITH DIFFERENTIAL (CANCER CENTER ONLY)
Abs Immature Granulocytes: 0.03 10*3/uL (ref 0.00–0.07)
Basophils Absolute: 0 10*3/uL (ref 0.0–0.1)
Basophils Relative: 0 %
Eosinophils Absolute: 0.1 10*3/uL (ref 0.0–0.5)
Eosinophils Relative: 2 %
HCT: 42.9 % (ref 39.0–52.0)
Hemoglobin: 14.5 g/dL (ref 13.0–17.0)
Immature Granulocytes: 0 %
Lymphocytes Relative: 39 %
Lymphs Abs: 2.8 10*3/uL (ref 0.7–4.0)
MCH: 30 pg (ref 26.0–34.0)
MCHC: 33.8 g/dL (ref 30.0–36.0)
MCV: 88.6 fL (ref 80.0–100.0)
Monocytes Absolute: 0.6 10*3/uL (ref 0.1–1.0)
Monocytes Relative: 8 %
Neutro Abs: 3.7 10*3/uL (ref 1.7–7.7)
Neutrophils Relative %: 51 %
Platelet Count: 131 10*3/uL — ABNORMAL LOW (ref 150–400)
RBC: 4.84 MIL/uL (ref 4.22–5.81)
RDW: 14.6 % (ref 11.5–15.5)
WBC Count: 7.3 10*3/uL (ref 4.0–10.5)
nRBC: 0 % (ref 0.0–0.2)

## 2019-10-24 LAB — CMP (CANCER CENTER ONLY)
ALT: 72 U/L — ABNORMAL HIGH (ref 0–44)
AST: 48 U/L — ABNORMAL HIGH (ref 15–41)
Albumin: 3.6 g/dL (ref 3.5–5.0)
Alkaline Phosphatase: 132 U/L — ABNORMAL HIGH (ref 38–126)
Anion gap: 19 — ABNORMAL HIGH (ref 5–15)
BUN: 11 mg/dL (ref 6–20)
CO2: 17 mmol/L — ABNORMAL LOW (ref 22–32)
Calcium: 9.4 mg/dL (ref 8.9–10.3)
Chloride: 97 mmol/L — ABNORMAL LOW (ref 98–111)
Creatinine: 1.6 mg/dL — ABNORMAL HIGH (ref 0.61–1.24)
GFR, Est AFR Am: 57 mL/min — ABNORMAL LOW (ref >60)
GFR, Estimated: 49 mL/min — ABNORMAL LOW (ref >60)
Glucose, Bld: 487 mg/dL — ABNORMAL HIGH (ref 70–99)
Potassium: 4.5 mmol/L (ref 3.5–5.1)
Sodium: 133 mmol/L — ABNORMAL LOW (ref 135–145)
Total Bilirubin: 0.4 mg/dL (ref 0.3–1.2)
Total Protein: 8.2 g/dL — ABNORMAL HIGH (ref 6.5–8.1)

## 2019-10-24 LAB — CEA (IN HOUSE-CHCC): CEA (CHCC-In House): 18.85 ng/mL — ABNORMAL HIGH (ref 0.00–5.00)

## 2019-10-24 MED ORDER — SODIUM CHLORIDE 0.9 % IV SOLN
5.0000 mg/kg | Freq: Once | INTRAVENOUS | Status: AC
  Administered 2019-10-24: 500 mg via INTRAVENOUS
  Filled 2019-10-24: qty 16

## 2019-10-24 MED ORDER — SODIUM CHLORIDE 0.9 % IV SOLN
180.0000 mg/m2 | Freq: Once | INTRAVENOUS | Status: AC
  Administered 2019-10-24: 420 mg via INTRAVENOUS
  Filled 2019-10-24: qty 15

## 2019-10-24 MED ORDER — PALONOSETRON HCL INJECTION 0.25 MG/5ML
INTRAVENOUS | Status: AC
  Filled 2019-10-24: qty 5

## 2019-10-24 MED ORDER — SODIUM CHLORIDE 0.9 % IV SOLN
Freq: Once | INTRAVENOUS | Status: AC
  Filled 2019-10-24: qty 250

## 2019-10-24 MED ORDER — LORAZEPAM 0.5 MG PO TABS: 0.5000 mg | ORAL_TABLET | Freq: Three times a day (TID) | ORAL | 0 refills | Status: DC | PRN

## 2019-10-24 MED ORDER — SODIUM CHLORIDE 0.9 % IV SOLN
2000.0000 mg/m2 | INTRAVENOUS | Status: DC
  Administered 2019-10-24: 4550 mg via INTRAVENOUS
  Filled 2019-10-24: qty 91

## 2019-10-24 MED ORDER — SODIUM CHLORIDE 0.9% FLUSH
10.0000 mL | INTRAVENOUS | Status: DC | PRN
  Administered 2019-10-24: 10 mL via INTRAVENOUS
  Filled 2019-10-24: qty 10

## 2019-10-24 MED ORDER — FLUOROURACIL CHEMO INJECTION 2.5 GM/50ML
400.0000 mg/m2 | Freq: Once | INTRAVENOUS | Status: AC
  Administered 2019-10-24: 900 mg via INTRAVENOUS
  Filled 2019-10-24: qty 18

## 2019-10-24 MED ORDER — PALONOSETRON HCL INJECTION 0.25 MG/5ML
0.2500 mg | Freq: Once | INTRAVENOUS | Status: AC
  Administered 2019-10-24: 0.25 mg via INTRAVENOUS

## 2019-10-24 MED ORDER — DEXAMETHASONE SODIUM PHOSPHATE 10 MG/ML IJ SOLN
INTRAMUSCULAR | Status: AC
  Filled 2019-10-24: qty 1

## 2019-10-24 MED ORDER — DEXAMETHASONE SODIUM PHOSPHATE 10 MG/ML IJ SOLN
5.0000 mg | Freq: Once | INTRAMUSCULAR | Status: AC
  Administered 2019-10-24: 5 mg via INTRAVENOUS

## 2019-10-24 MED ORDER — ATROPINE SULFATE 1 MG/ML IJ SOLN
0.5000 mg | Freq: Once | INTRAMUSCULAR | Status: AC | PRN
  Administered 2019-10-24: 0.5 mg via INTRAVENOUS

## 2019-10-24 MED ORDER — SODIUM CHLORIDE 0.9 % IV SOLN
400.0000 mg/m2 | Freq: Once | INTRAVENOUS | Status: AC
  Administered 2019-10-24: 912 mg via INTRAVENOUS
  Filled 2019-10-24: qty 45.6

## 2019-10-24 MED ORDER — SODIUM CHLORIDE 0.9 % IV SOLN: 5.0000 mg | Freq: Once | INTRAVENOUS | Status: DC

## 2019-10-24 MED ORDER — ATROPINE SULFATE 1 MG/ML IJ SOLN
INTRAMUSCULAR | Status: AC
  Filled 2019-10-24: qty 1

## 2019-10-24 MED ORDER — SODIUM CHLORIDE 0.9 % IV SOLN
150.0000 mg | Freq: Once | INTRAVENOUS | Status: AC
  Administered 2019-10-24: 150 mg via INTRAVENOUS
  Filled 2019-10-24: qty 150

## 2019-10-24 NOTE — Telephone Encounter (Signed)
Spoke with pt's wife regarding recent magic mouthwash prescription. Informed wife per pharmacy that his prescription should be ready to pick after 4pm today. [name] verblalizes understanding and agrees with plan of care.

## 2019-10-24 NOTE — Patient Instructions (Addendum)
[facility] Discharge Instructions for Patients Receiving Chemotherapy  Today you received the following chemotherapy agents: Bevacizumab, Irinotecan, Leucovorin, 5FU  To help prevent nausea and vomiting after your treatment, we encourage you to take your nausea medication as directed.    If you develop nausea and vomiting that is not controlled by your nausea medication, call the clinic.   BELOW ARE SYMPTOMS THAT SHOULD BE REPORTED IMMEDIATELY:  *FEVER GREATER THAN 100.5 F  *CHILLS WITH OR WITHOUT FEVER  NAUSEA AND VOMITING THAT IS NOT CONTROLLED WITH YOUR NAUSEA MEDICATION  *UNUSUAL SHORTNESS OF BREATH  *UNUSUAL BRUISING OR BLEEDING  TENDERNESS IN MOUTH AND THROAT WITH OR WITHOUT PRESENCE OF ULCERS  *URINARY PROBLEMS  *BOWEL PROBLEMS  UNUSUAL RASH Items with * indicate a potential emergency and should be followed up as soon as possible.  Feel free to call the clinic should you have any questions or concerns. The clinic phone number is [phone].  Please show the CHEMO ALERT CARD at check-in to the Emergency Department and triage nurse.   

## 2019-10-24 NOTE — Telephone Encounter (Signed)
Regarding a prescription for magic mouthwash that had been previously phoned in

## 2019-10-24 NOTE — Telephone Encounter (Signed)
Per Ned Card NP called in magic mouth wash prescription for patient to [facility] on [address]. Patient made aware prescription was called in .  Magic Mouth Wash 240cc 31mL wish and spit Q4xD PRN (maloxx, Nystatin, benadryl)

## 2019-10-24 NOTE — Progress Notes (Signed)
[place] OFFICE PROGRESS NOTE   Diagnosis: Colon cancer  INTERVAL HISTORY:   [name] returns as scheduled.  He completed cycle 3 FOLFIRI/bevacizumab 10/10/2019.  He reports nausea beginning the day prior to chemotherapy.  He thinks is anticipatory.  He had nausea/vomiting on several occasions a few days after the most recent chemotherapy.  Phenergan was not effective.  He noted a single sore on his tongue.  No diarrhea.  No bleeding.  Blood sugars "fluctuate".  Objective:  Vital signs in last 24 hours:  Blood pressure (!) 135/95, pulse 99, temperature (!) 97.5 F (36.4 C), temperature source Temporal, resp. rate 18, height '5\' 10"'$  (1.778 m), weight 231 lb 14.4 oz (105.2 kg), SpO2 99 %.    HEENT: No thrush.  Question tiny ulceration left mid tongue. GI: Abdomen soft and nontender. Vascular: No leg edema. Neuro: Alert and oriented. Skin: Palms without erythema. Port-A-Cath without erythema.   Lab Results:  Lab Results  Component Value Date   WBC 7.3 10/24/2019   HGB 14.5 10/24/2019   HCT 42.9 10/24/2019   MCV 88.6 10/24/2019   PLT 131 (L) 10/24/2019   NEUTROABS 3.7 10/24/2019    Imaging:  No results found.  Medications: I have reviewed the patient's current medications.  Assessment/Plan: 1. 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 CTschest, abdomen, and pelvis on 12/11/2015-indeterminate tiny pulmonary nodules, multiple liver metastases, small nodes in the sigmoid mesocolon  3. Laparoscopic sigmoid colectomy, liver biopsy, Port-A-Cath placement 01/14/2016  4. Pathology sigmoid colon resection-colonic adenocarcinoma, 5.3 cm extending into pericolonic connective tissue, positive lymph-vascular invasion, positive perineural invasion, negative margins, metastatic carcinoma in9 of 28 lymph nodes  5. Pathology liver biopsy-metastatic colorectal adenocarcinoma   6. MSI stable; mismatch repair protein normal  7. 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 Cycle 1 FOLFOX 02/02/2016  9. Cycle 2 FOLFOX 02/15/2016  10. Cycle 3 FOLFOX 02/29/2016  11. Cycle 4 FOLFOX 03/14/2016  12. Cycle 5 FOLFOX 03/28/2016  13. Cycle 6 FOLFOX 04/11/2016 (oxaliplatin held secondary to thrombocytopenia)  14. 04/21/2016 restaging CTs-difficulty evaluating liver lesions due to hepatic steatosis. Stable right adrenal nodule. No adenopathy or local recurrence near the rectosigmoid anastomotic site.  15. Cycle 7 FOLFOX 04/25/2016  16. MRI liver 05/02/2016-partial improvement in hepatic metastases  17. Cycle 8 FOLFOX 05/10/2016  18. Cycle 9 FOLFOX 05/23/2016 (oxaliplatin held due to thrombocytopenia)  19. Cycle 10 FOLFOX 06/06/2016 (oxaliplatin dose reduced due to thrombocytopenia)  20. Cycle 11 FOLFOX 06/27/2016 (oxaliplatin held due to neuropathy)  21. Cycle 12 FOLFOX 07/11/2016 (oxaliplatin held) 22. Initiation of maintenance Xeloda 7 days on/7 days off 07/27/2016 23. MRI liver 11/18/2016-decrease in hepatic metastatic disease. No new or progressive disease identified within the abdomen. 24. Continuation of Xeloda 7 days on/7 days off 25. MRI liver 04/27/2017-previous liver lesions not identified, no new lesions, no lymphadenopathy 26. Xeloda continued 7 days on/7 days off 27. MRI liver 12/04/2017 -no evidence of metastatic disease, hepatic steatosis 28. Xeloda continued 7 days on/7 days off 29. MRI liver 07/15/2018-no evidence of metastatic disease. Stable severe hepatic steatosis. 30. Xeloda continued 7 days on/7 days off 31. MRI liver 03/16/2019-hepatic steatosis, no liver mass, focal area of intrahepatic biliary dilatation in segments 2 and 3 of the left lobe-increased 32. Xeloda continued 7 days on/7 days off 33. MRI abdomen 08/19/2019-no findings to suggest liver metastases. Bilateral lung nodules measuring up to 2.8 cm, progressive and more conspicuous  than  on previous exam 34. CT chest 08/29/2019-multiple pulmonary metastases, new from 04/21/2016 35. Cycle 1 FOLFIRI/bevacizumab 09/09/2019 36. Cycle 2 FOLFIRI/bevacizumab 09/26/2019  37. Cycle 3 FOLFIRI/bevacizumab 10/10/2019 38. Cycle 4 FOLFIRI/bevacizumab 10/24/2019  2. Rectal bleeding and constipation secondary to #1  3. History of peripheral vascular disease, status post left lower extremity vascular bypass surgery in April 2017  4. History of nephrolithiasis  5. History of Graves' disease treated with radioactive iodine  6. Anxiety/depression  7. Hypertension  8. Hospitalization 01/19/2016 with wound dehiscence status post secondary suture closure of abdominal wall  9. 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 Hyperglycemia 06/20/2016-diagnosed with diabetes, maintained on insulin  11.Positive COVID test 12/13/2018  Disposition: Jeff [name] appears stable.  He has completed 3 cycles of FOLFIRI/bevacizumab.  Plan to proceed with cycle 4 today as scheduled.  He will be referred for restaging CTs after cycle 5.  He is experiencing both anticipatory and delayed nausea.  He will try Ativan 0.5 mg every 8 hours as needed.  We are adding Emend to the premedication regimen.  We reviewed the CBC from today.  Counts adequate to proceed.  He has stable mild thrombocytopenia.  He will contact the office with any bleeding.  He will return for lab, follow-up, cycle 5 FOLFIRI/Avastin in 2 weeks.  Plan reviewed with Dr. Benay Spice.  Ned Card ANP/GNP-BC   10/24/2019  9:59 AM

## 2019-10-24 NOTE — Progress Notes (Signed)
Per Ned Card, NP, OK to treat with elevated creatinine. Informed of elevated blood glucose and stated that patient needs to take home insulin as prescribed and f/u with PCP. Patient informed.

## 2019-10-24 NOTE — Telephone Encounter (Signed)
Scheduled per 6/10 los. R/s appt from 6/23 to 6/24 per pt request. Pt Could only do Thursdays. Printed appt calendar for pt. Decline AVS.

## 2019-10-25 ENCOUNTER — Inpatient Hospital Stay (HOSPITAL_COMMUNITY): Payer: Medicaid Other

## 2019-10-25 ENCOUNTER — Observation Stay (HOSPITAL_COMMUNITY)
Admission: EM | Admit: 2019-10-25 | Discharge: 2019-10-26 | Disposition: A | Discharge status: Home / Self Care | Payer: Medicaid Other | Source: Other Acute Inpatient Hospital | Attending: Internal Medicine | Admitting: Internal Medicine

## 2019-10-25 DIAGNOSIS — Z79899 Other long term (current) drug therapy: Secondary | ICD-10-CM | POA: Diagnosis not present

## 2019-10-25 DIAGNOSIS — Z794 Long term (current) use of insulin: Secondary | ICD-10-CM | POA: Insufficient documentation

## 2019-10-25 DIAGNOSIS — Z6833 Body mass index (BMI) 33.0-33.9, adult: Secondary | ICD-10-CM | POA: Diagnosis not present

## 2019-10-25 DIAGNOSIS — E1110 Type 2 diabetes mellitus with ketoacidosis without coma: Principal | ICD-10-CM | POA: Insufficient documentation

## 2019-10-25 DIAGNOSIS — R569 Unspecified convulsions: Secondary | ICD-10-CM | POA: Diagnosis not present

## 2019-10-25 DIAGNOSIS — Z981 Arthrodesis status: Secondary | ICD-10-CM | POA: Diagnosis not present

## 2019-10-25 DIAGNOSIS — C189 Malignant neoplasm of colon, unspecified: Secondary | ICD-10-CM | POA: Diagnosis not present

## 2019-10-25 DIAGNOSIS — G930 Cerebral cysts: Secondary | ICD-10-CM | POA: Diagnosis not present

## 2019-10-25 DIAGNOSIS — N400 Enlarged prostate without lower urinary tract symptoms: Secondary | ICD-10-CM | POA: Insufficient documentation

## 2019-10-25 DIAGNOSIS — E785 Hyperlipidemia, unspecified: Secondary | ICD-10-CM | POA: Insufficient documentation

## 2019-10-25 DIAGNOSIS — F329 Major depressive disorder, single episode, unspecified: Secondary | ICD-10-CM | POA: Diagnosis not present

## 2019-10-25 DIAGNOSIS — J45909 Unspecified asthma, uncomplicated: Secondary | ICD-10-CM | POA: Insufficient documentation

## 2019-10-25 DIAGNOSIS — Z9111 Patient's noncompliance with dietary regimen: Secondary | ICD-10-CM | POA: Diagnosis not present

## 2019-10-25 DIAGNOSIS — D696 Thrombocytopenia, unspecified: Secondary | ICD-10-CM | POA: Diagnosis not present

## 2019-10-25 DIAGNOSIS — N179 Acute kidney failure, unspecified: Secondary | ICD-10-CM | POA: Diagnosis not present

## 2019-10-25 DIAGNOSIS — Z79890 Hormone replacement therapy (postmenopausal): Secondary | ICD-10-CM | POA: Diagnosis not present

## 2019-10-25 DIAGNOSIS — C787 Secondary malignant neoplasm of liver and intrahepatic bile duct: Secondary | ICD-10-CM | POA: Insufficient documentation

## 2019-10-25 DIAGNOSIS — Z7982 Long term (current) use of aspirin: Secondary | ICD-10-CM | POA: Insufficient documentation

## 2019-10-25 DIAGNOSIS — E1151 Type 2 diabetes mellitus with diabetic peripheral angiopathy without gangrene: Secondary | ICD-10-CM | POA: Diagnosis not present

## 2019-10-25 DIAGNOSIS — K219 Gastro-esophageal reflux disease without esophagitis: Secondary | ICD-10-CM | POA: Insufficient documentation

## 2019-10-25 DIAGNOSIS — I10 Essential (primary) hypertension: Secondary | ICD-10-CM | POA: Insufficient documentation

## 2019-10-25 DIAGNOSIS — F419 Anxiety disorder, unspecified: Secondary | ICD-10-CM | POA: Diagnosis not present

## 2019-10-25 DIAGNOSIS — M1990 Unspecified osteoarthritis, unspecified site: Secondary | ICD-10-CM | POA: Diagnosis not present

## 2019-10-25 DIAGNOSIS — E890 Postprocedural hypothyroidism: Secondary | ICD-10-CM | POA: Diagnosis not present

## 2019-10-25 DIAGNOSIS — I252 Old myocardial infarction: Secondary | ICD-10-CM | POA: Diagnosis not present

## 2019-10-25 DIAGNOSIS — G40309 Generalized idiopathic epilepsy and epileptic syndromes, not intractable, without status epilepticus: Secondary | ICD-10-CM

## 2019-10-25 HISTORY — DX: Generalized idiopathic epilepsy and epileptic syndromes, not intractable, without status epilepticus: G40.309

## 2019-10-25 LAB — MAGNESIUM: Magnesium: 2.6 mg/dL — ABNORMAL HIGH (ref 1.7–2.4)

## 2019-10-25 LAB — GLUCOSE, CAPILLARY
Glucose-Capillary: 147 mg/dL — ABNORMAL HIGH (ref 70–99)
Glucose-Capillary: 141 mg/dL — ABNORMAL HIGH (ref 70–99)
Glucose-Capillary: 110 mg/dL — ABNORMAL HIGH (ref 70–99)
Glucose-Capillary: 105 mg/dL — ABNORMAL HIGH (ref 70–99)
Glucose-Capillary: 200 mg/dL — ABNORMAL HIGH (ref 70–99)
Glucose-Capillary: 238 mg/dL — ABNORMAL HIGH (ref 70–99)
Glucose-Capillary: 358 mg/dL — ABNORMAL HIGH (ref 70–99)
Glucose-Capillary: 352 mg/dL — ABNORMAL HIGH (ref 70–99)
Glucose-Capillary: 253 mg/dL — ABNORMAL HIGH (ref 70–99)
Glucose-Capillary: 243 mg/dL — ABNORMAL HIGH (ref 70–99)

## 2019-10-25 LAB — CBC WITH DIFFERENTIAL/PLATELET
Abs Immature Granulocytes: 0.03 10*3/uL (ref 0.00–0.07)
Basophils Absolute: 0 10*3/uL (ref 0.0–0.1)
Basophils Relative: 0 %
Eosinophils Absolute: 0 10*3/uL (ref 0.0–0.5)
Eosinophils Relative: 0 %
HCT: 39.4 % (ref 39.0–52.0)
Hemoglobin: 12.9 g/dL — ABNORMAL LOW (ref 13.0–17.0)
Immature Granulocytes: 0 %
Lymphocytes Relative: 22 %
Lymphs Abs: 1.8 10*3/uL (ref 0.7–4.0)
MCH: 29.2 pg (ref 26.0–34.0)
MCHC: 32.7 g/dL (ref 30.0–36.0)
MCV: 89.1 fL (ref 80.0–100.0)
Monocytes Absolute: 0.7 10*3/uL (ref 0.1–1.0)
Monocytes Relative: 9 %
Neutro Abs: 5.5 10*3/uL (ref 1.7–7.7)
Neutrophils Relative %: 69 %
Platelets: 111 10*3/uL — ABNORMAL LOW (ref 150–400)
RBC: 4.42 MIL/uL (ref 4.22–5.81)
RDW: 15.1 % (ref 11.5–15.5)
WBC: 8 10*3/uL (ref 4.0–10.5)
nRBC: 0 % (ref 0.0–0.2)

## 2019-10-25 LAB — BASIC METABOLIC PANEL
Anion gap: 12 (ref 5–15)
Anion gap: 8 (ref 5–15)
BUN: 16 mg/dL (ref 6–20)
BUN: 15 mg/dL (ref 6–20)
CO2: 21 mmol/L — ABNORMAL LOW (ref 22–32)
CO2: 22 mmol/L (ref 22–32)
Calcium: 8.2 mg/dL — ABNORMAL LOW (ref 8.9–10.3)
Calcium: 8.1 mg/dL — ABNORMAL LOW (ref 8.9–10.3)
Chloride: 104 mmol/L (ref 98–111)
Chloride: 104 mmol/L (ref 98–111)
Creatinine, Ser: 1.44 mg/dL — ABNORMAL HIGH (ref 0.61–1.24)
Creatinine, Ser: 1.62 mg/dL — ABNORMAL HIGH (ref 0.61–1.24)
GFR calc Af Amer: >60 mL/min (ref >60)
GFR calc Af Amer: 56 mL/min — ABNORMAL LOW (ref >60)
GFR calc non Af Amer: 55 mL/min — ABNORMAL LOW (ref >60)
GFR calc non Af Amer: 48 mL/min — ABNORMAL LOW (ref >60)
Glucose, Bld: 218 mg/dL — ABNORMAL HIGH (ref 70–99)
Glucose, Bld: 355 mg/dL — ABNORMAL HIGH (ref 70–99)
Potassium: 4.2 mmol/L (ref 3.5–5.1)
Potassium: 4.4 mmol/L (ref 3.5–5.1)
Sodium: 137 mmol/L (ref 135–145)
Sodium: 134 mmol/L — ABNORMAL LOW (ref 135–145)

## 2019-10-25 LAB — BETA-HYDROXYBUTYRIC ACID
Beta-Hydroxybutyric Acid: 0.51 mmol/L — ABNORMAL HIGH (ref 0.05–0.27)
Beta-Hydroxybutyric Acid: 0.29 mmol/L — ABNORMAL HIGH (ref 0.05–0.27)

## 2019-10-25 LAB — TSH: TSH: 0.251 u[IU]/mL — ABNORMAL LOW (ref 0.350–4.500)

## 2019-10-25 LAB — HIV ANTIBODY (ROUTINE TESTING W REFLEX): HIV Screen 4th Generation wRfx: NONREACTIVE

## 2019-10-25 LAB — HEMOGLOBIN A1C
Hgb A1c MFr Bld: 12.4 % — ABNORMAL HIGH (ref 4.8–5.6)
Mean Plasma Glucose: 309.18 mg/dL

## 2019-10-25 MED ORDER — CLONAZEPAM 0.5 MG PO TABS
1.0000 mg | ORAL_TABLET | Freq: Three times a day (TID) | ORAL | Status: DC | PRN
  Administered 2019-10-25: 1 mg via ORAL
  Filled 2019-10-25: qty 2

## 2019-10-25 MED ORDER — DEXTROSE 50 % IV SOLN: 0.0000 mL | INTRAVENOUS | Status: DC | PRN

## 2019-10-25 MED ORDER — SODIUM CHLORIDE 0.9 % IV SOLN: INTRAVENOUS | Status: DC

## 2019-10-25 MED ORDER — NITROGLYCERIN 0.4 MG SL SUBL: 0.4000 mg | SUBLINGUAL_TABLET | SUBLINGUAL | Status: DC | PRN

## 2019-10-25 MED ORDER — ASPIRIN EC 81 MG PO TBEC
81.0000 mg | DELAYED_RELEASE_TABLET | Freq: Every day | ORAL | Status: DC
  Administered 2019-10-25 – 2019-10-26 (×2): 81 mg via ORAL
  Filled 2019-10-25 (×2): qty 1

## 2019-10-25 MED ORDER — VENLAFAXINE HCL ER 75 MG PO CP24
225.0000 mg | ORAL_CAPSULE | Freq: Every day | ORAL | Status: DC
  Administered 2019-10-25 – 2019-10-26 (×2): 225 mg via ORAL
  Filled 2019-10-25 (×2): qty 3

## 2019-10-25 MED ORDER — TAMSULOSIN HCL 0.4 MG PO CAPS
0.4000 mg | ORAL_CAPSULE | Freq: Every evening | ORAL | Status: DC
  Administered 2019-10-25: 0.4 mg via ORAL
  Filled 2019-10-25: qty 1

## 2019-10-25 MED ORDER — PANTOPRAZOLE SODIUM 40 MG PO TBEC
40.0000 mg | DELAYED_RELEASE_TABLET | Freq: Every day | ORAL | Status: DC
  Administered 2019-10-25 – 2019-10-26 (×2): 40 mg via ORAL
  Filled 2019-10-25 (×2): qty 1

## 2019-10-25 MED ORDER — ATORVASTATIN CALCIUM 80 MG PO TABS
80.0000 mg | ORAL_TABLET | Freq: Every day | ORAL | Status: DC
  Administered 2019-10-25 – 2019-10-26 (×2): 80 mg via ORAL
  Filled 2019-10-25 (×2): qty 1

## 2019-10-25 MED ORDER — LEVOTHYROXINE SODIUM 75 MCG PO TABS
150.0000 ug | ORAL_TABLET | Freq: Every day | ORAL | Status: DC
  Administered 2019-10-26: 150 ug via ORAL
  Filled 2019-10-25: qty 2

## 2019-10-25 MED ORDER — INSULIN REGULAR(HUMAN) IN NACL 100-0.9 UT/100ML-% IV SOLN
INTRAVENOUS | Status: DC
  Administered 2019-10-25: 5.5 [IU]/h via INTRAVENOUS
  Administered 2019-10-26: 5 [IU]/h via INTRAVENOUS
  Filled 2019-10-25 (×2): qty 100

## 2019-10-25 MED ORDER — DEXTROSE-NACL 5-0.45 % IV SOLN: INTRAVENOUS | Status: DC

## 2019-10-25 MED ORDER — ACETAMINOPHEN 325 MG PO TABS
650.0000 mg | ORAL_TABLET | Freq: Four times a day (QID) | ORAL | Status: DC | PRN
  Administered 2019-10-25: 650 mg via ORAL
  Filled 2019-10-25: qty 2

## 2019-10-25 MED ORDER — LEVETIRACETAM 500 MG PO TABS
500.0000 mg | ORAL_TABLET | Freq: Two times a day (BID) | ORAL | Status: DC
  Administered 2019-10-25 – 2019-10-26 (×2): 500 mg via ORAL
  Filled 2019-10-25 (×2): qty 1

## 2019-10-25 NOTE — Progress Notes (Addendum)
Inpatient Diabetes Program Recommendations  AACE/ADA: New Consensus Statement on Inpatient Glycemic Control (2015)  Target Ranges:  Prepandial:   less than 140 mg/dL      Peak postprandial:   less than 180 mg/dL (1-2 hours)      Critically ill patients:  140 - 180 mg/dL   Lab Results  Component Value Date   GLUCAP 141 (H) 10/25/2019   HGBA1C 6.5 02/07/2017    Review of Glycemic Control Came from [place] ED/IV insulin already infusing Diabetes history: DM 2 Outpatient Diabetes medications: Levemir 40 units bid, Novolog 10 units tid, Metformin 1000 mg bid, Farxiga 5 mg Daily Current orders for Inpatient glycemic control:  IV insulin  Glucose 487 in labs from cancer center in addition to CO2 17 and anion gap 19.  Decadron 5 mg given after lab check yesterday. Pt on chemotherapy and has had N/V recently with it.  Pt on Farxiga at home, an SGLT 2 inhibitor known for causing DKA Since pt is prone to DKA would not reorder this at time of d/c and follow up with prescriber.  Current glucose in the 140's will need to wait on BMET and Betahydroxybutyric acid possibly before starting IV insulin. Also need to verify K+ levels are ok to start IV insulin.  Will have to place pt on higher concentration of dextrose if still in DKA to elevate glucose and clear acidosis. Sometimes pt have euglycemia DKA with SGLT-2 inhibitor DKA.  Will plan to see.  Addendum 238 pm:  Spoke with pt at bedside and verified insulin regimen by calling his wife. Pt reports elevated glucose levels for 2-3 months. Pt sees Janna Arch in [place]. Discussed Farxiga medication and tendency toward DKA. Discussed possibly not being on it at time of d/c.  Called pt's wife who said Pt has seen Dr. Loanne Drilling 3 tears ago, however Dr. Loanne Drilling said his trends were so good his PCP could manage him. PTs wife let PCP manage due to pt seeing multiple doctors and simplifying care. Wife reports trends have been good until 2-3 months  ago when they switched to a different chemo with Decadron and ever since glucose trends have been 190-500's at home. Wife reports being in close contact with PCP fr insulin adjustments. Pt wife also reported that pt's PCP said if pt continued to have elevated glucose they would place in the hospital with IV insulin for glucose control. Discussed Farxiga's tendency to DKA that was precipitated by baseline hyperglycemia and decadron. Wife also discussed with me pt had seizure with EMS and had to be taken straight [facility] instead of directly to [facility].  [name] reports they may have to go back to Dr. Loanne Drilling (Endocrinologist) in help with glucose control.  Thanks,  Tama Headings RN, MSN, BC-ADM Inpatient Diabetes Coordinator Team Pager [phone] (8a-5p)

## 2019-10-25 NOTE — Consult Note (Addendum)
NEURO HOSPITALIST CONSULT NOTE   Requesting physician: Dr. Doristine Bosworth   Reason for Consult: seizures CC: DKA and new onset seizure  History obtained from:  Patient  / Chart    HPI:                                                                                                                                          Jeff [name] is an 54 y.o. male  With PMH DM2,  colon cancer ( currently chemo cycle 4), graves disease s/p radioactive iodine, PVD (s/0 bypass graft 2017), HTN, chronic thrombocytopenia who presented to [facility] ED after seizure.   Per chart. Patient received chemo yesterday. Family observed GTC at home then 2 more seizure witnessed by EMS. EMS administered 5mg  IV versed. Attempted to call wife x2 for better description of seizure but was unable to reach her by phone. patient denies seizure history. Patient reports that his blood sugar has been this high previously without seizure.  At [place] BG: 566, hypotensive SBP: 80's patient given 1g keppra and transferred to [facility].    MRI brain : pending  Past Medical History:  Diagnosis Date  . Allergic rhinitis   . Anxiety   . Arthritis   . Asthma   . At risk for sleep apnea    STOP-BANG= 6       SENT TO PCP 01-22-2015  . Chronic back pain    "from the neck to the lower back"   . 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 Depression   . Dysthymic disorder   . ED (erectile dysfunction)   . GERD (gastroesophageal reflux disease)   . History of acute pyelonephritis    01-07-2015  . History of Graves' disease    vs  Multinodular  . History of hiatal hernia   . History of kidney stones   . History of non-ST elevation myocardial infarction (NSTEMI)    Jan 2014--  no CAD;  per notes probable coronary vasospasm  . History of panic attacks   . History of septic shock    01-07-2015--   ureterolithias/ pyelonephritis  . History of thyroid storm    Nov 2011  . Hyperlipidemia   . Hypertension   . Hypothyroidism following radioiodine therapy    RAI in Mar 2012---  followed by dr Loanne Drilling  . PAD (peripheral artery disease) [facility]) cardiologist-  dr Fletcher Anon   a.  ABI (01/2014):  L 0.53; R 1.0 >> referred to [facility]  . Right ureteral stone   . Septic shock ([place]) 01/05/2015  . Sleep disturbance   . Thrombocytopenia ([place])     Past Surgical History:  Procedure Laterality Date  .  ABDOMINAL AORTAGRAM N/A 02/19/2014   Procedure: ABDOMINAL Maxcine Ham;  Surgeon: Wellington Hampshire, MD;  Location: [facility] CATH LAB;  Service: Cardiovascular;  Laterality: N/A;  . ANTERIOR CERVICAL DECOMP/DISCECTOMY FUSION  09-08-2010   C3 -4  . BACK SURGERY     x5, LOWER X 3, UPPER NECK X 2  . CYSTOSCOPY W/ URETERAL STENT PLACEMENT Right 01/04/2015   Procedure: CYSTOSCOPY WITH RETROGRADE PYELOGRAM/URETERAL STENT PLACEMENT;  Surgeon: Franchot Gallo, MD;  Location: [place];  Service: Urology;  Laterality: Right;  . CYSTOSCOPY W/ URETERAL STENT REMOVAL Right 01/26/2015   Procedure: CYSTOSCOPY WITH STENT REMOVAL;  Surgeon: Franchot Gallo, MD;  Location: [name];  Service: Urology;  Laterality: Right;  . CYSTOSCOPY/URETEROSCOPY/HOLMIUM LASER/STENT PLACEMENT Right 01/26/2015   Procedure: CYSTOSCOPY/URETEROSCOPY RIGHT;  Surgeon: Franchot Gallo, MD;  Location: [facility];  Service: Urology;  Laterality: Right;  . FEMORAL-POPLITEAL BYPASS GRAFT Left 09/11/2015   Procedure: BYPASS GRAFT LEFT ABOVE KNEE TO BELOW KNEE POPLITEAL ARTERY WITH LEFT GREATER SAPHENOUS VEIN;  Surgeon: Serafina Mitchell, MD;  Location: [facility];  Service: Vascular;  Laterality: Left;  . INCISIONAL HERNIA REPAIR N/A 01/19/2016   Procedure: HERNIA REPAIR INCISIONAL;  Surgeon: Leighton Ruff, MD;  Location: [facility];  Service: General;  Laterality: N/A;  . LAMINECTOMY AND MICRODISCECTOMY LUMBAR SPINE  03-26-2009   left L5 -- S1   . LAPAROSCOPIC SIGMOID COLECTOMY N/A 01/14/2016   Procedure: LAPAROSCOPIC SIGMOID COLECTOMY;  Surgeon: Leighton Ruff, MD;  Location: [facility];  Service: General;  Laterality: N/A;  . LEFT HEART CATHETERIZATION WITH CORONARY ANGIOGRAM N/A 06/12/2012   Procedure: LEFT HEART CATHETERIZATION WITH CORONARY ANGIOGRAM;  Surgeon: Josue Hector, MD;  Location: [facility] C[facility] LAB;  Service: Cardiovascular;  Laterality: N/A;   No sig. CAD/  normal LVF, ef 55-65%  . LIVER BIOPSY N/A 01/14/2016   Procedure: LIVER BIOPSY;  Surgeon: Leighton Ruff, MD;  Location: [facility];  Service: General;  Laterality: N/A;  . LOWER EXTREMITY ANGIOGRAM Left 05/28/2014   Procedure: LOWER EXTREMITY ANGIOGRAM;  Surgeon: Wellington Hampshire, MD;  Location: [facility] CATH LAB;  Service: Cardiovascular;  Laterality: Left;  Failed PTA CTO  . POPLITEAL ARTERY ANGIOPLASTY Left 05/28/2014    dr Fletcher Anon   Attempted and unsuccessful due to inability to cross the occlusionnotes   . PORTACATH PLACEMENT Right 01/14/2016   Procedure: INSERTION PORT-A-CATH;  Surgeon: Leighton Ruff, MD;  Location: [facility];  Service: General;  Laterality: Right;  . POSTERIOR CERVICAL FUSION/FORAMINOTOMY N/A 12/10/2012   Procedure: POSTERIOR CERVICAL FUSION/FORAMINOTOMY LEVEL 1;  Surgeon: Ophelia Charter, MD;  Location: [place] NEURO ORS;  Service: Neurosurgery;  Laterality: N/A;  Cervical three-four posterior cervical fusion with lateral mass screws  . POSTERIOR LUMBAR FUSION  11-11-2009;   07-15-2013   L5 -- S1;   L4-- S1  . SHOULDER ARTHROSCOPY Right 03-08-2004   debridement labral tear/  DCR/  acromioplasty  . TRANSTHORACIC ECHOCARDIOGRAM  01-05-2015   mild LVH/  ef 60-65%/  mild TR  . UMBILICAL HERNIA REPAIR  1980  . VEIN HARVEST Left 09/11/2015   Procedure: LEFT GREATER SAPHENOUS VEIN HARVEST;  Surgeon: Serafina Mitchell, MD;  Location: [facility];  Service: Vascular;  Laterality: Left;    Family History  Problem Relation Age of Onset  . Thyroid disease Mother        hypothyroidism  .  Heart attack Maternal Grandfather   . Heart Problems Father        pacermaker  . Edema Father   .  Heart disease Maternal Grandmother   . Lung cancer Maternal Grandmother   . Diabetes Maternal Grandmother   . Hypertension Maternal Grandmother       Social History:  reports that he has never smoked. He has never used smokeless tobacco. He reports current alcohol use. He reports that he does not use drugs.  Allergies  Allergen Reactions  . Hydrocodone Hives    MEDICATIONS:                                                                                                                     Scheduled: . aspirin EC  81 mg Oral Daily  . atorvastatin  80 mg Oral Daily  . [START ON 10/26/2019] levothyroxine  150 mcg Oral Q0600  . pantoprazole  40 mg Oral Daily  . tamsulosin  0.4 mg Oral QPM  . venlafaxine XR  225 mg Oral Daily   Continuous: . sodium chloride    . dextrose 5 % and 0.45% NaCl 75 mL/hr at 10/25/19 1423  . insulin 1.5 Units/hr (10/25/19 1427)   GXQ:JJHERDEYCX, dextrose, nitroGLYCERIN   ROS:                                                                                                                                       ROS was performed and is negative except as noted in HPI    Blood pressure 104/72, pulse 99, temperature 98.5 F (36.9 C), temperature source Oral, resp. rate 18, height 5\' 10"  (4.481 m), weight 102.6 kg, SpO2 100 %.   General Examination:                                                                                                       Physical Exam  Constitutional: Appears well-developed and well-nourished.  Psych: Affect appropriate to situation Eyes: Normal external eye and conjunctiva. HENT: Normocephalic, no lesions, without obvious abnormality.   Musculoskeletal-no joint tenderness, deformity or swelling Cardiovascular: Normal rate and regular rhythm.  Respiratory: Effort  normal, non-labored breathing saturations WNL GI: Soft.  No  distension. There is no tenderness.  Skin: WDI  Neurological Examination Mental Status: Drowsy, easy to arouse with verbal stimuli, oriented, thought content appropriate.  Speech fluent without evidence of aphasia.  Able to follow  commands without difficulty. Cranial Nerves: II: Visual fields grossly normal,  III,IV, VI: ptosis not present, extra-ocular motions intact bilaterally pupils equal, round, reactive to light and accommodation V,VII: smile symmetric, facial light touch sensation normal bilaterally VIII: hearing normal bilaterally IX,X: uvula rises symmetrically XI: bilateral shoulder shrug XII: midline tongue extension Motor: Right : Upper extremity   5/5  Left:     Upper extremity   5/5  Lower extremity   5/5   Lower extremity   5/5 Tone and bulk:normal tone throughout; no atrophy noted Sensory: Pinprick and light touch intact throughout, bilaterally Deep Tendon Reflexes: 2+ and symmetric throughout Cerebellar: No ataxia noted Gait: deferred   Lab Results: Basic Metabolic Panel: Recent Labs  Lab 10/24/19 0932  NA 133*  K 4.5  CL 97*  CO2 17*  GLUCOSE 487*  BUN 11  CREATININE 1.60*  CALCIUM 9.4    CBC: Recent Labs  Lab 10/24/19 0932  WBC 7.3  NEUTROABS 3.7  HGB 14.5  HCT 42.9  MCV 88.6  PLT 131*    Imaging: EEG adult  Result Date: 10/25/2019 Lora Havens, MD     10/25/2019  3:21 PM Patient Name: Jeff [name] MRN: [id number] Epilepsy Attending: Lora Havens Referring Physician/Provider: Dr Early Osmond Date:  10/25/2019 Duration: 25.52 mins Patient history: 54 year old male with history of colon cancer currently on chemo presented with new onset seizure-like episode.  EEG to evaluate for seizures. Level of alertness: Awake, asleep AEDs during EEG study: None Technical aspects: This EEG study was done with scalp electrodes positioned according to the 10-20 International system of electrode placement. Electrical activity was acquired at a  sampling rate of 500Hz  and reviewed with a high frequency filter of 70Hz  and a low frequency filter of 1Hz . EEG data were recorded continuously and digitally stored. Description: The posterior dominant rhythm consists of 8-9 Hz activity of moderate voltage (25-35 uV) seen predominantly in posterior head regions, symmetric and reactive to eye opening and eye closing.Sleep was characterized by vertex waves, sleep spindles (12 to 14 Hz), maximal frontocentral region.  EEG showed intermittent generalized 4 to 5 Hz theta slowing.  Frequent spikes were also noted in the right fronto central region, maximal F4. Physiology photic driving was not seen during photic stimulation.  Hyperventilation was not performed.   ABNORMALITY -Spikes, right frontocentral region, maximal F4 -Intermittent slow, generalized IMPRESSION: This study showed evidence of epileptogenicity arising from right frontocentral region as well as mild diffuse encephalopathy, nonspecific etiology.  No seizures were seen throughout the recording. Lora Havens    Assessment:  54 y.o. male  With PMH DM2,  colon cancer ( currently chemo cycle 4), graves disease s/p radioactive iodine, PVD (s/0 bypass graft 2017), HTN, chronic thrombocytopenia who presented to [place] ED after seizure. EEG showed area of epileptogenicity arising from right frontocentral region. Will continue keppra twice daily     Recommendations: --Start Keppra 500 mg BID --Seizure precautions --MRI brain w/ wo contrast  Laurey Morale, MSN, NP-C [facility] [phone]  Attending neurologist's note to follow  I have seen the patient reviewed the above note.  He does describe some episodes of loss time in the past, but is not certain that these  were actual staring spells.  He has no history of significant head injury, no family history of epilepsy or perinatal insult.  CT head does not reveal any evidence of tumor, however he will need an MRI given his  cancer history.  With focal evidence of epileptogenicity, he does have a high risk of recurrent seizures without treatment and therefore he will need to be on Keppra from here on out.  I also discussed with him that he is not allowed to drive by [facility] law for 6 months.  Roland Rack, MD [facility] [phone]  If 7pm- 7am, please page neurology on call as listed in [place].      10/25/2019, 2:47 PM

## 2019-10-25 NOTE — Progress Notes (Signed)
PT Cancellation Note  Patient Details Name: Jeff [name] MRN: [id number] DOB: Dec 06, 1965   Cancelled Treatment:    Reason Eval/Treat Not Completed: Patient at procedure or test/unavailable. Pt having EEG placed, then going for MRI. Will check back for PT Evaluation as schedule permits.  Mabeline Caras, PT, DPT Acute Rehabilitation Services  Pager [phone] Office [place] 10/25/2019, 2:46 PM

## 2019-10-25 NOTE — Progress Notes (Signed)
EEG complete - results pending 

## 2019-10-25 NOTE — Progress Notes (Signed)
Attempted pt and we are unable to obtain MRI exam at this time, due to the pt being on a drug infusion device. RN is unaware if the pt can come off of the pump for MRI. RN to notify MD and follow up with MRI.

## 2019-10-25 NOTE — Evaluation (Signed)
Clinical/Bedside Swallow Evaluation Patient Details  Name: Jeff [name] MRN: [id number] Date of Birth: [birth year]  Today's Date: 10/25/2019 Time: SLP Start Time (ACUTE ONLY): 69 SLP Stop Time (ACUTE ONLY): 1635 SLP Time Calculation (min) (ACUTE ONLY): 16 min  Past Medical History:  Past Medical History:  Diagnosis Date  . Allergic rhinitis   . Anxiety   . Arthritis   . Asthma   . At risk for sleep apnea    STOP-BANG= 6       SENT TO PCP 01-22-2015  . Chronic back pain    "from the neck to the lower back"   . 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 Depression   . Dysthymic disorder   . ED (erectile dysfunction)   . GERD (gastroesophageal reflux disease)   . History of acute pyelonephritis    01-07-2015  . History of Graves' disease    vs  Multinodular  . History of hiatal hernia   . History of kidney stones   . History of non-ST elevation myocardial infarction (NSTEMI)    Jan 2014--  no CAD;  per notes probable coronary vasospasm  . History of panic attacks   . History of septic shock    01-07-2015--  ureterolithias/ pyelonephritis  . History of thyroid storm    Nov 2011  . Hyperlipidemia   . Hypertension   . Hypothyroidism following radioiodine therapy    RAI in Mar 2012---  followed by dr Loanne Drilling  . PAD (peripheral artery disease) [facility]) cardiologist-  dr Fletcher Anon   a.  ABI (01/2014):  L 0.53; R 1.0 >> referred to PV  . Right ureteral stone   . Septic shock ([place]) 01/05/2015  . Sleep disturbance   . Thrombocytopenia ([place])    Past Surgical History:  Past Surgical History:  Procedure Laterality Date  . ABDOMINAL AORTAGRAM N/A 02/19/2014   Procedure: ABDOMINAL Maxcine Ham;  Surgeon: Wellington Hampshire, MD;  Location: [place] CATH [facility];  Service: Cardiovascular;  Laterality: N/A;  . ANTERIOR CERVICAL DECOMP/DISCECTOMY FUSION  09-08-2010   C3 -4  . BACK SURGERY      x5, LOWER X 3, UPPER NECK X 2  . CYSTOSCOPY W/ URETERAL STENT PLACEMENT Right 01/04/2015   Procedure: CYSTOSCOPY WITH RETROGRADE PYELOGRAM/URETERAL STENT PLACEMENT;  Surgeon: Franchot Gallo, MD;  Location: [place];  Service: Urology;  Laterality: Right;  . CYSTOSCOPY W/ URETERAL STENT REMOVAL Right 01/26/2015   Procedure: CYSTOSCOPY WITH STENT REMOVAL;  Surgeon: Franchot Gallo, MD;  Location: [facility];  Service: Urology;  Laterality: Right;  . CYSTOSCOPY/URETEROSCOPY/HOLMIUM LASER/STENT PLACEMENT Right 01/26/2015   Procedure: CYSTOSCOPY/URETEROSCOPY RIGHT;  Surgeon: Franchot Gallo, MD;  Location: [facility];  Service: Urology;  Laterality: Right;  . FEMORAL-POPLITEAL BYPASS GRAFT Left 09/11/2015   Procedure: BYPASS GRAFT LEFT ABOVE KNEE TO BELOW KNEE POPLITEAL ARTERY WITH LEFT GREATER SAPHENOUS VEIN;  Surgeon: Serafina Mitchell, MD;  Location: [place];  Service: Vascular;  Laterality: Left;  . INCISIONAL HERNIA REPAIR N/A 01/19/2016   Procedure: HERNIA REPAIR INCISIONAL;  Surgeon: Leighton Ruff, MD;  Location: [facility];  Service: General;  Laterality: N/A;  . LAMINECTOMY AND MICRODISCECTOMY LUMBAR SPINE  03-26-2009   left L5 -- S1  . LAPAROSCOPIC SIGMOID COLECTOMY N/A 01/14/2016   Procedure: LAPAROSCOPIC SIGMOID COLECTOMY;  Surgeon: Leighton Ruff, MD;  Location: [facility];  Service: General;  Laterality: N/A;  . LEFT HEART  CATHETERIZATION WITH CORONARY ANGIOGRAM N/A 06/12/2012   Procedure: LEFT HEART CATHETERIZATION WITH CORONARY ANGIOGRAM;  Surgeon: Josue Hector, MD;  Location: [facility] CATH [facility];  Service: Cardiovascular;  Laterality: N/A;   No sig. CAD/  normal LVF, ef 55-65%  . LIVER BIOPSY N/A 01/14/2016   Procedure: LIVER BIOPSY;  Surgeon: Leighton Ruff, MD;  Location: [facility];  Service: General;  Laterality: N/A;  . LOWER EXTREMITY ANGIOGRAM Left 05/28/2014   Procedure: LOWER EXTREMITY ANGIOGRAM;  Surgeon: Wellington Hampshire, MD;  Location: [place] CATH LAB;  Service:  Cardiovascular;  Laterality: Left;  Failed PTA CTO  . POPLITEAL ARTERY ANGIOPLASTY Left 05/28/2014    dr Fletcher Anon   Attempted and unsuccessful due to inability to cross the occlusionnotes   . PORTACATH PLACEMENT Right 01/14/2016   Procedure: INSERTION PORT-A-CATH;  Surgeon: Leighton Ruff, MD;  Location: [facility];  Service: General;  Laterality: Right;  . POSTERIOR CERVICAL FUSION/FORAMINOTOMY N/A 12/10/2012   Procedure: POSTERIOR CERVICAL FUSION/FORAMINOTOMY LEVEL 1;  Surgeon: Ophelia Charter, MD;  Location: [place] [facility];  Service: Neurosurgery;  Laterality: N/A;  Cervical three-four posterior cervical fusion with lateral mass screws  . POSTERIOR LUMBAR FUSION  11-11-2009;   07-15-2013   L5 -- S1;   L4-- S1  . SHOULDER ARTHROSCOPY Right 03-08-2004   debridement labral tear/  DCR/  acromioplasty  . TRANSTHORACIC ECHOCARDIOGRAM  01-05-2015   mild LVH/  ef 60-65%/  mild TR  . UMBILICAL HERNIA REPAIR  1980  . VEIN HARVEST Left 09/11/2015   Procedure: LEFT GREATER SAPHENOUS VEIN HARVEST;  Surgeon: Serafina Mitchell, MD;  Location: [place];  Service: Vascular;  Laterality: Left;   HPI:  Pt is a 54 y.o. male with PMH DM2,  colon cancer (currently chemo cycle 4), graves disease s/p radioactive iodine, PVD (s/p bypass graft 2017), HTN, chronic thrombocytopenia who presented to [facility] ED after seizure and was subsequently transferred to [facility]. EEG: evidence of epileptogenicity arising from right frontocentral region as well as mild diffuse encephalopathy, nonspecific etiology.    Assessment / Plan / Recommendation Clinical Impression  Pt was seen for bedside swallow evaluation and he denied a history of dysphagia. Oral mechanism exam was WNL and dentition was adequate. He tolerated all solids and liquids without signs or symptoms of oropharyngeal dysphagia. A regular texture diet with thin liquids is recommended at this this and further skilled SLP services are not clinically indicated for swallowing.  SLP  Visit Diagnosis: Dysphagia, unspecified (R13.10)    Aspiration Risk  No limitations    Diet Recommendation Regular;Thin liquid   Liquid Administration via: Straw;Cup Medication Administration: Whole meds with liquid Supervision: Patient able to self feed Postural Changes: Seated upright at 90 degrees    Other  Recommendations Oral Care Recommendations: Oral care BID;Patient independent with oral care   Follow up Recommendations None      Frequency and Duration            Prognosis        Swallow Study   General Date of Onset: 10/24/19 HPI: Pt is a 54 y.o. male with PMH DM2,  colon cancer (currently chemo cycle 4), graves disease s/p radioactive iodine, PVD (s/p bypass graft 2017), HTN, chronic thrombocytopenia who presented to [facility] ED after seizure and was subsequently transferred to [facility]. EEG: evidence of epileptogenicity arising from right frontocentral region as well as mild diffuse encephalopathy, nonspecific etiology.  Type of Study: Bedside Swallow Evaluation Previous Swallow Assessment: None Diet Prior to this Study:  NPO Temperature Spikes Noted: No Respiratory Status: Room air History of Recent Intubation: No Behavior/Cognition: Alert;Cooperative Oral Cavity Assessment: Within Functional Limits Oral Care Completed by SLP: No Oral Cavity - Dentition: Adequate natural dentition Vision: Functional for self-feeding Self-Feeding Abilities: Able to feed self Patient Positioning: Upright in bed;Postural control adequate for testing Baseline Vocal Quality: Normal Volitional Cough: Strong Volitional Swallow: Able to elicit    Oral/Motor/Sensory Function Overall Oral Motor/Sensory Function: Within functional limits   Ice Chips Ice chips: Within functional limits Presentation: Spoon   Thin Liquid Thin Liquid: Within functional limits Presentation: Straw    Nectar Thick Nectar Thick Liquid: Not tested   Honey Thick Honey Thick Liquid: Not tested   Puree  Puree: Within functional limits Presentation: Spoon   Solid     Solid: Within functional limits Presentation: [place] I[address] Hardin Negus, [place], [place] Office number [phone] Pager [phone]  Horton Marshall 10/25/2019,4:40 PM

## 2019-10-25 NOTE — Procedures (Signed)
Patient Name: Jeff [name]  MRN: [id number]  Epilepsy Attending: Lora Havens  Referring Physician/Provider: Dr Early Osmond Date:  10/25/2019 Duration: 25.52 mins  Patient history: 54 year old male with history of colon cancer currently on chemo presented with new onset seizure-like episode.  EEG to evaluate for seizures.  Level of alertness: Awake, asleep  AEDs during EEG study: None  Technical aspects: This EEG study was done with scalp electrodes positioned according to the 10-20 International system of electrode placement. Electrical activity was acquired at a sampling rate of 500Hz  and reviewed with a high frequency filter of 70Hz  and a low frequency filter of 1Hz . EEG data were recorded continuously and digitally stored.   Description: The posterior dominant rhythm consists of 8-9 Hz activity of moderate voltage (25-35 uV) seen predominantly in posterior head regions, symmetric and reactive to eye opening and eye closing.Sleep was characterized by vertex waves, sleep spindles (12 to 14 Hz), maximal frontocentral region.  EEG showed intermittent generalized 4 to 5 Hz theta slowing.  Frequent spikes were also noted in the right fronto central region, maximal F4. Physiology photic driving was not seen during photic stimulation.  Hyperventilation was not performed.     ABNORMALITY -Spikes, right frontocentral region, maximal F4 -Intermittent slow, generalized  IMPRESSION: This study showed evidence of epileptogenicity arising from right frontocentral region as well as mild diffuse encephalopathy, nonspecific etiology.  No seizures were seen throughout the recording.      Alvar Malinoski Barbra Sarks

## 2019-10-25 NOTE — H&P (Signed)
History and Physical    Jeff [name]:[id number] DOB: [birth year] DOA: 10/25/2019  PCP: Charlynn Court, NP  Patient coming from: [facility] ED I have personally briefly reviewed patient's old medical records in [place]  Chief Complaint: DKA and new onset seizure  HPI: Jeff [name] is a 54 y.o. male with medical history significant of colon cancer (currently on chemo-cycle 4), history of Graves' disease status post radioactive iodine, anxiety/depression, hypertension, PVD status post left lower extremity vascular bypass surgery in 2017, chronic thrombocytopenia, type 2 diabetes mellitus presented to [facility] ED by EMS after seizure.  Patient received chemo earlier yesterday.  Family observed generalized tonic-clonic episode at home then 2 additional seizures were witnessed by EMS prompting administration of 5 mg IV Versed.  Upon arrival to [facility] ED: Patient was found to be in DKA with initial blood sugar of 566 with bicarb of 10, gap of 28, pH of 7.2.  He was hypotensive with systolic BPs in the 73U which resolved with IV fluid bolus.  Patient started on insulin drip.  CT head and CT chest/abdomen/pelvis came back negative for acute findings or infection.  Patient was given 1 g of Keppra for seizure.  Patient transferred to [facility] for further evaluation and management.  During my encounter: Patient resting comfortably on the bed.  Reports mild headache and jaw pain however denies dizziness, lightheadedness, chest pain, shortness of breath, abdominal pain, melena, hematemesis, fever, chills, cough, congestion, urinary changes.  He tells me that he has lost 5 pounds in 1 month.  Never had seizure before.  No history of smoking, alcohol, illicit drug use.  Review of Systems: As per HPI otherwise negative.    Past Medical History:  Diagnosis Date  . Allergic rhinitis   . Anxiety   . Arthritis   . Asthma   . At risk for sleep apnea    STOP-BANG= 6       SENT TO PCP  01-22-2015  . Chronic back pain    "from the neck to the lower back"   . 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 Depression   . Dysthymic disorder   . ED (erectile dysfunction)   . GERD (gastroesophageal reflux disease)   . History of acute pyelonephritis    01-07-2015  . History of Graves' disease    vs  Multinodular  . History of hiatal hernia   . History of kidney stones   . History of non-ST elevation myocardial infarction (NSTEMI)    Jan 2014--  no CAD;  per notes probable coronary vasospasm  . History of panic attacks   . History of septic shock    01-07-2015--  ureterolithias/ pyelonephritis  . History of thyroid storm    Nov 2011  . Hyperlipidemia   . Hypertension   . Hypothyroidism following radioiodine therapy    RAI in Mar 2012---  followed by dr Loanne Drilling  . PAD (peripheral artery disease) [facility]) cardiologist-  dr Fletcher Anon   a.  ABI (01/2014):  L 0.53; R 1.0 >> referred to [facility]  . Right ureteral stone   . Septic shock ([place]) 01/05/2015  . Sleep disturbance   . Thrombocytopenia ([place])     Past Surgical History:  Procedure Laterality Date  . ABDOMINAL AORTAGRAM N/A 02/19/2014   Procedure: ABDOMINAL Maxcine Ham;  Surgeon: Wellington Hampshire, MD;  Location: [place] CATH LAB;  Service: Cardiovascular;  Laterality: N/A;  . ANTERIOR CERVICAL DECOMP/DISCECTOMY FUSION  09-08-2010   C3 -4  . BACK SURGERY     x5, LOWER X 3, UPPER NECK X 2  . CYSTOSCOPY W/ URETERAL STENT PLACEMENT Right 01/04/2015   Procedure: CYSTOSCOPY WITH RETROGRADE PYELOGRAM/URETERAL STENT PLACEMENT;  Surgeon: Franchot Gallo, MD;  Location: [place];  Service: Urology;  Laterality: Right;  . CYSTOSCOPY W/ URETERAL STENT REMOVAL Right 01/26/2015   Procedure: CYSTOSCOPY WITH STENT REMOVAL;  Surgeon: Franchot Gallo, MD;  Location: [facility];  Service: Urology;  Laterality: Right;  .  CYSTOSCOPY/URETEROSCOPY/HOLMIUM LASER/STENT PLACEMENT Right 01/26/2015   Procedure: CYSTOSCOPY/URETEROSCOPY RIGHT;  Surgeon: Franchot Gallo, MD;  Location: [facility];  Service: Urology;  Laterality: Right;  . FEMORAL-POPLITEAL BYPASS GRAFT Left 09/11/2015   Procedure: BYPASS GRAFT LEFT ABOVE KNEE TO BELOW KNEE POPLITEAL ARTERY WITH LEFT GREATER SAPHENOUS VEIN;  Surgeon: Serafina Mitchell, MD;  Location: [place];  Service: Vascular;  Laterality: Left;  . INCISIONAL HERNIA REPAIR N/A 01/19/2016   Procedure: HERNIA REPAIR INCISIONAL;  Surgeon: Leighton Ruff, MD;  Location: [facility];  Service: General;  Laterality: N/A;  . LAMINECTOMY AND MICRODISCECTOMY LUMBAR SPINE  03-26-2009   left L5 -- S1  . LAPAROSCOPIC SIGMOID COLECTOMY N/A 01/14/2016   Procedure: LAPAROSCOPIC SIGMOID COLECTOMY;  Surgeon: Leighton Ruff, MD;  [facility]: [facility];  Service: General;  Laterality: N/A;  . LEFT HEART CATHETERIZATION WITH CORONARY ANGIOGRAM N/A 06/12/2012   Procedure: LEFT HEART CATHETERIZATION WITH CORONARY ANGIOGRAM;  Surgeon: Josue Hector, MD;  Location: [facility] CATH LAB;  Service: Cardiovascular;  Laterality: N/A;   No sig. CAD/  normal LVF, ef 55-65%  . LIVER BIOPSY N/A 01/14/2016   Procedure: LIVER BIOPSY;  Surgeon: Leighton Ruff, MD;  [facility];  Service: General;  Laterality: N/A;  . LOWER EXTREMITY ANGIOGRAM Left 05/28/2014   Procedure: LOWER EXTREMITY ANGIOGRAM;  Surgeon: Wellington Hampshire, MD;  Location: [place] CATH LAB;  Service: Cardiovascular;  Laterality: Left;  Failed PTA CTO  . POPLITEAL ARTERY ANGIOPLASTY Left 05/28/2014    dr Fletcher Anon   Attempted and unsuccessful due to inability to cross the occlusionnotes   . PORTACATH PLACEMENT Right 01/14/2016   Procedure: INSERTION PORT-A-CATH;  Surgeon: Leighton Ruff, MD;  [facility]: [facility];  Service: General;  Laterality: Right;  . POSTERIOR CERVICAL FUSION/FORAMINOTOMY N/A 12/10/2012   Procedure: POSTERIOR CERVICAL FUSION/FORAMINOTOMY LEVEL 1;  Surgeon:  Ophelia Charter, MD;  Location: [place] NEURO ORS;  Service: Neurosurgery;  Laterality: N/A;  Cervical three-four posterior cervical fusion with lateral mass screws  . POSTERIOR LUMBAR FUSION  11-11-2009;   07-15-2013   L5 -- S1;   L4-- S1  . SHOULDER ARTHROSCOPY Right 03-08-2004   debridement labral tear/  DCR/  acromioplasty  . TRANSTHORACIC ECHOCARDIOGRAM  01-05-2015   mild LVH/  ef 60-65%/  mild TR  . UMBILICAL HERNIA REPAIR  1980  . VEIN HARVEST Left 09/11/2015   Procedure: LEFT GREATER SAPHENOUS VEIN HARVEST;  Surgeon: Serafina Mitchell, MD;  Location: [place];  Service: Vascular;  Laterality: Left;     reports that he has never smoked. He has never used smokeless tobacco. He reports current alcohol use. He reports that he does not use drugs.  Allergies  Allergen Reactions  . Hydrocodone Hives    Family History  Problem Relation Age of Onset  . Thyroid disease Mother        hypothyroidism  . Heart attack Maternal Grandfather   . Heart  Problems Father        pacermaker  . Edema Father   . Heart disease Maternal Grandmother   . Lung cancer Maternal Grandmother   . Diabetes Maternal Grandmother   . Hypertension Maternal Grandmother     Prior to Admission medications   Medication Sig Start Date End Date Taking? Authorizing Provider  amLODipine (NORVASC) 5 MG tablet TAKE 1 TABLET(5 MG) BY MOUTH DAILY Patient taking differently: Take 5 mg by mouth daily.  04/18/19  Yes Wellington Hampshire, MD  aspirin EC 81 MG tablet Take 81 mg by mouth daily.   Yes [provider]  atorvastatin (LIPITOR) 80 MG tablet TAKE 1 TABLET(80 MG) BY MOUTH EVERY EVENING Patient taking differently: Take 80 mg by mouth daily.  04/18/19  Yes Wellington Hampshire, MD  clonazePAM (KLONOPIN) 1 MG tablet Take 1 mg by mouth 2 (two) times daily as needed. 09/28/18  Yes [provider]  dapagliflozin propanediol (FARXIGA) 5 MG TABS tablet Take 5 mg by mouth daily.   Yes [provider]  docusate  sodium (COLACE) 100 MG capsule Take 100 mg by mouth 2 (two) times daily.   Yes [provider]  insulin aspart (NOVOLOG) 100 UNIT/ML injection Inject 10 Units into the skin 3 (three) times daily before meals.   Yes [provider]  insulin detemir (LEVEMIR) 100 UNIT/ML injection Inject 40 Units into the skin 2 (two) times daily.    Yes [provider]  levothyroxine (SYNTHROID) 150 MCG tablet Take 1 tablet by mouth once daily. **Must be seen in office for future refills** 10/22/18  Yes Renato Shin, MD  lidocaine-prilocaine (EMLA) cream Apply 1 application topically as needed. Apply to portacath site 1 hour prior to use 01/03/19  Yes Ladell Pier, MD  meclizine (ANTIVERT) 25 MG tablet Take 25 mg by mouth 3 (three) times daily as needed. 02/25/16  Yes [provider]  metFORMIN (GLUCOPHAGE-XR) 500 MG 24 hr tablet Take 1,000 mg by mouth in the morning and at bedtime.  10/30/16  Yes [provider]  metoprolol tartrate (LOPRESSOR) 50 MG tablet TAKE 1/2 TABLET BY MOUTH TWICE DAILY Patient taking differently: Take 25 mg by mouth 2 (two) times daily.  08/15/19  Yes Wellington Hampshire, MD  nitroGLYCERIN (NITROSTAT) 0.4 MG SL tablet PLACE 1 TABLET UNDER THE TONGUE EVERY 5 MINUTES AS NEEDED FOR CHEST PAIN. Patient taking differently: Place 0.4 mg under the tongue every 5 (five) minutes as needed for chest pain.  12/11/15  Yes Gollan, Kathlene November, MD  nystatin (MYCOSTATIN) 100000 UNIT/ML suspension Take 5 mLs (500,000 Units total) by mouth 4 (four) times daily. 12/13/18  Yes Deno Etienne, DO  oxyCODONE-acetaminophen (PERCOCET) 10-325 MG tablet Take 1 tablet by mouth every 6 (six) hours as needed for pain (for back pain). 09/11/15  Yes Virgina Jock A, PA-C  pantoprazole (PROTONIX) 40 MG tablet Take 40 mg by mouth daily.  10/23/15  Yes [provider]  promethazine (PHENERGAN) 12.5 MG tablet Take 1 tablet (12.5 mg total) by mouth every 6 (six) hours as needed. Patient  taking differently: Take 12.5 mg by mouth every 6 (six) hours as needed for nausea or vomiting.  09/26/19  Yes Owens Shark, NP  tamsulosin (FLOMAX) 0.4 MG CAPS capsule Take 0.4 mg by mouth every evening.    Yes [provider]  venlafaxine XR (EFFEXOR-XR) 75 MG 24 hr capsule Take 225 mg by mouth daily. 09/09/19  Yes [provider]  Ermalene Postin  LANCETS lancets USE  TID TO CHECK BLOOD SUGAR LEVELS 06/22/16   [provider]  LORazepam (ATIVAN) 0.5 MG tablet Take 1 tablet (0.5 mg total) by mouth every 8 (eight) hours as needed for anxiety or sleep (nausea). 10/24/19   Owens Shark, NP    Physical Exam: Vitals:   10/25/19 1228  BP: 104/72  Pulse: 99  Resp: 18  Temp: 98.5 F (36.9 C)  TempSrc: Oral  SpO2: 100%  Weight: 102.6 kg  Height: 5\' 10"  (1.778 m)    Constitutional: NAD, calm, comfortable, communicating well, appears tired Eyes: PERRL, lids and conjunctivae normal ENMT: Mucous membranes are moist. Posterior pharynx clear of any exudate or lesions.Normal dentition.  Neck: normal, supple, no masses, no thyromegaly Respiratory: clear to auscultation bilaterally, no wheezing, no crackles. Normal respiratory effort. No accessory muscle use.  Cardiovascular: Regular rate and rhythm, no murmurs / rubs / gallops. No extremity edema. 2+ pedal pulses. No carotid bruits.  Abdomen: no tenderness, no masses palpated. No hepatosplenomegaly. Bowel sounds positive.  Musculoskeletal: no clubbing / cyanosis. No joint deformity upper and lower extremities. Good ROM, no contractures. Normal muscle tone.  Skin: no rashes, lesions, ulcers. No induration Neurologic: CN 2-12 grossly intact. Sensation intact, DTR normal. Strength 5/5 in all 4.  Psychiatric: Normal judgment and insight. Alert and oriented x 3. Normal mood.    Labs on Admission: I have personally reviewed following labs and imaging studies  CBC: Recent Labs  Lab 10/24/19 0932  WBC 7.3  NEUTROABS 3.7    HGB 14.5  HCT 42.9  MCV 88.6  PLT 003*   Basic Metabolic Panel: Recent Labs  Lab 10/24/19 0932  NA 133*  K 4.5  CL 97*  CO2 17*  GLUCOSE 487*  BUN 11  CREATININE 1.60*  CALCIUM 9.4   GFR: Estimated Creatinine Clearance: 64.8 mL/min (A) (by C-G formula based on SCr of 1.6 mg/dL (H)). Liver Function Tests: Recent Labs  Lab 10/24/19 0932  AST 48*  ALT 72*  ALKPHOS 132*  BILITOT 0.4  PROT 8.2*  ALBUMIN 3.6   No results for input(s): LIPASE, AMYLASE in the last 168 hours. No results for input(s): AMMONIA in the last 168 hours. Coagulation Profile: No results for input(s): INR, PROTIME in the last 168 hours. Cardiac Enzymes: No results for input(s): CKTOTAL, CKMB, CKMBINDEX, TROPONINI in the last 168 hours. BNP (last 3 results) No results for input(s): PROBNP in the last 8760 hours. HbA1C: No results for input(s): HGBA1C in the last 72 hours. CBG: Recent Labs  Lab 10/25/19 1214 10/25/19 1258  GLUCAP 147* 141*   Lipid Profile: No results for input(s): CHOL, HDL, LDLCALC, TRIG, CHOLHDL, LDLDIRECT in the last 72 hours. Thyroid Function Tests: No results for input(s): TSH, T4TOTAL, FREET4, T3FREE, THYROIDAB in the last 72 hours. Anemia Panel: No results for input(s): VITAMINB12, FOLATE, FERRITIN, TIBC, IRON, RETICCTPCT in the last 72 hours. Urine analysis:    Component Value Date/Time   COLORURINE YELLOW 01/19/2016 0842   APPEARANCEUR CLEAR 01/19/2016 0842   LABSPEC 1.027 01/19/2016 0842   PHURINE 7.0 01/19/2016 0842   GLUCOSEU NEGATIVE 01/19/2016 0842   HGBUR NEGATIVE 01/19/2016 0842   BILIRUBINUR NEGATIVE 01/19/2016 0842   KETONESUR NEGATIVE 01/19/2016 0842   PROTEINUR NEGATIVE 10/10/2019 0855   UROBILINOGEN 0.2 01/06/2015 0547   NITRITE NEGATIVE 01/19/2016 0842   LEUKOCYTESUR NEGATIVE 01/19/2016 0842    Radiological Exams on Admission: No results found.   Assessment/Plan Principal Problem:   DKA (diabetic ketoacidoses) (HCC) Active  Problems:    Hypertension   Hyperlipidemia   Metastatic colon cancer to liver [facility] 2)   Seizure ([place])   DKA: -Patient has history of type 2 diabetes.  Received chemo including Decadron yesterday. -Blood sugar upon arrival at [facility] was 566, bicarb of 10, gap of 28, pH of 7.2.  Received IV fluid bolus and started on insulin drip. -Continue insulin drip.  BMP every 4 hours -Continue IV fluids normal saline when blood sugar more than 250, change IV fluids to D5 half-normal saline when blood sugar less than 250.  Monitor blood sugar closely.  -Last A1c: 6.0 -checked 3 years ago.  Repeat A1c today -Monitor vitals closely. -Consult diabetes counselor -Repeat labs are pending.  New onset seizure: -Could be due to elevated blood sugar? -[facility] head at [facility] ED was negative for acute findings.  CT abdomen/pelvis/chest was negative for infection.  He is afebrile. -Order EEG and MRI brain.  Consult neurology-await recommendations -We will keep her n.p.o. -PT/OT/SLP consulted -On fall precaution -On seizure precaution  Metastatic colon cancer: -Getting chemo outpatient.  Last chemo was on 10/24/2019 -Followed by Dr. Betsy Coder outpatient -Consulted oncology  Hypertension: Blood pressure is soft -Hold amlodipine and metoprolol for now.  Monitor blood pressure closely.  Resume once blood pressure is back to baseline.  Graves' disease status post radioactive iodine: -Check TSH.  Continue levothyroxine  Depression/anxiety: Continue Effexor, Klonopin as needed  PVD: Status post left lower extremity vascular bypass surgery in 2017: -Continue statin  GERD: Continue PPI  BPH: Continue Flomax  Chronic thrombocytopenia: Last platelet count: 131 -No signs of active bleeding.  Likely secondary to chemo.  Continue to monitor.  Coronary artery disease: Continue aspirin, statin, nitro as needed, hold metoprolol for now as blood pressure is on lower side.  AKI: -Labs from yesterday shows  creatinine: 1.60, GFR: 57 (baseline creatinine: 1.40, GFR more than 60) -Continue IV fluids.  Avoid nephrotoxic medication.  Ordered labs today.  Talked to patient's oncologist Dr. Gwendel Hanson recommended to consult IV team to [facility] chemo.  He will keep an eye on patient's MRI result.  Call him as needed.  If patient remain hospitalized until Monday he will come and see the patient on Monday.  DVT prophylaxis: TED/SCD Code Status: Full code-confirmed with the patient Family Communication: None present at bedside.  Plan of care discussed with patient in length and he verbalized understanding and agreed with it. Disposition Plan: To be determined Consults called: Neurology and oncology  admission status: Inpatient   Mckinley Jewel MD [facility]  If 7PM-7AM, please contact night-coverage [email or web address] Password [facility]  10/25/2019, 2:06 PM

## 2019-10-26 ENCOUNTER — Inpatient Hospital Stay (HOSPITAL_COMMUNITY): Payer: Medicaid Other

## 2019-10-26 ENCOUNTER — Inpatient Hospital Stay: Payer: Medicaid Other

## 2019-10-26 ENCOUNTER — Other Ambulatory Visit: Payer: Self-pay

## 2019-10-26 DIAGNOSIS — E1110 Type 2 diabetes mellitus with ketoacidosis without coma: Secondary | ICD-10-CM

## 2019-10-26 DIAGNOSIS — C787 Secondary malignant neoplasm of liver and intrahepatic bile duct: Secondary | ICD-10-CM

## 2019-10-26 DIAGNOSIS — C189 Malignant neoplasm of colon, unspecified: Secondary | ICD-10-CM

## 2019-10-26 DIAGNOSIS — R569 Unspecified convulsions: Secondary | ICD-10-CM | POA: Diagnosis not present

## 2019-10-26 DIAGNOSIS — I10 Essential (primary) hypertension: Secondary | ICD-10-CM | POA: Diagnosis not present

## 2019-10-26 DIAGNOSIS — E782 Mixed hyperlipidemia: Secondary | ICD-10-CM | POA: Diagnosis not present

## 2019-10-26 LAB — BASIC METABOLIC PANEL
Anion gap: 13 (ref 5–15)
Anion gap: 9 (ref 5–15)
BUN: 14 mg/dL (ref 6–20)
BUN: 11 mg/dL (ref 6–20)
CO2: 21 mmol/L — ABNORMAL LOW (ref 22–32)
CO2: 24 mmol/L (ref 22–32)
Calcium: 8.1 mg/dL — ABNORMAL LOW (ref 8.9–10.3)
Calcium: 8.1 mg/dL — ABNORMAL LOW (ref 8.9–10.3)
Chloride: 105 mmol/L (ref 98–111)
Chloride: 104 mmol/L (ref 98–111)
Creatinine, Ser: 1.46 mg/dL — ABNORMAL HIGH (ref 0.61–1.24)
Creatinine, Ser: 1.24 mg/dL (ref 0.61–1.24)
GFR calc Af Amer: >60 mL/min (ref >60)
GFR calc Af Amer: >60 mL/min (ref >60)
GFR calc non Af Amer: 55 mL/min — ABNORMAL LOW (ref >60)
GFR calc non Af Amer: >60 mL/min (ref >60)
Glucose, Bld: 164 mg/dL — ABNORMAL HIGH (ref 70–99)
Glucose, Bld: 203 mg/dL — ABNORMAL HIGH (ref 70–99)
Potassium: 4 mmol/L (ref 3.5–5.1)
Potassium: 3.8 mmol/L (ref 3.5–5.1)
Sodium: 139 mmol/L (ref 135–145)
Sodium: 137 mmol/L (ref 135–145)

## 2019-10-26 LAB — GLUCOSE, CAPILLARY
Glucose-Capillary: 140 mg/dL — ABNORMAL HIGH (ref 70–99)
Glucose-Capillary: 141 mg/dL — ABNORMAL HIGH (ref 70–99)
Glucose-Capillary: 152 mg/dL — ABNORMAL HIGH (ref 70–99)
Glucose-Capillary: 157 mg/dL — ABNORMAL HIGH (ref 70–99)
Glucose-Capillary: 164 mg/dL — ABNORMAL HIGH (ref 70–99)
Glucose-Capillary: 173 mg/dL — ABNORMAL HIGH (ref 70–99)
Glucose-Capillary: 183 mg/dL — ABNORMAL HIGH (ref 70–99)
Glucose-Capillary: 189 mg/dL — ABNORMAL HIGH (ref 70–99)
Glucose-Capillary: 303 mg/dL — ABNORMAL HIGH (ref 70–99)

## 2019-10-26 LAB — BETA-HYDROXYBUTYRIC ACID: Beta-Hydroxybutyric Acid: 0.18 mmol/L (ref 0.05–0.27)

## 2019-10-26 MED ORDER — INSULIN ASPART 100 UNIT/ML ~~LOC~~ SOLN
3.0000 [IU] | Freq: Three times a day (TID) | SUBCUTANEOUS | Status: DC
  Administered 2019-10-26: 3 [IU] via SUBCUTANEOUS

## 2019-10-26 MED ORDER — INSULIN ASPART 100 UNIT/ML ~~LOC~~ SOLN: 0.0000 [IU] | Freq: Every day | SUBCUTANEOUS | Status: DC

## 2019-10-26 MED ORDER — LEVETIRACETAM 500 MG PO TABS: 500.0000 mg | ORAL_TABLET | Freq: Two times a day (BID) | ORAL | 2 refills | Status: DC

## 2019-10-26 MED ORDER — GADOBUTROL 1 MMOL/ML IV SOLN
10.0000 mL | Freq: Once | INTRAVENOUS | Status: AC | PRN
  Administered 2019-10-26: 10 mL via INTRAVENOUS

## 2019-10-26 MED ORDER — INSULIN DETEMIR 100 UNIT/ML ~~LOC~~ SOLN: 40.0000 [IU] | Freq: Two times a day (BID) | SUBCUTANEOUS | 0 refills | Status: DC

## 2019-10-26 MED ORDER — SODIUM CHLORIDE 0.9% FLUSH
10.0000 mL | Freq: Two times a day (BID) | INTRAVENOUS | Status: DC
  Administered 2019-10-26: 30 mL

## 2019-10-26 MED ORDER — LIVING WELL WITH DIABETES BOOK
Freq: Once | Status: DC
  Filled 2019-10-26: qty 1

## 2019-10-26 MED ORDER — SODIUM CHLORIDE 0.9% FLUSH
10.0000 mL | INTRAVENOUS | Status: DC | PRN
  Administered 2019-10-26: 10 mL

## 2019-10-26 MED ORDER — INSULIN ASPART 100 UNIT/ML ~~LOC~~ SOLN: 12.0000 [IU] | Freq: Three times a day (TID) | SUBCUTANEOUS | 0 refills | Status: DC

## 2019-10-26 MED ORDER — CHLORHEXIDINE GLUCONATE CLOTH 2 % EX PADS
6.0000 | MEDICATED_PAD | Freq: Every day | CUTANEOUS | Status: DC
  Administered 2019-10-26: 6 via TOPICAL

## 2019-10-26 MED ORDER — INSULIN ASPART 100 UNIT/ML ~~LOC~~ SOLN
0.0000 [IU] | Freq: Three times a day (TID) | SUBCUTANEOUS | Status: DC
  Administered 2019-10-26: 7 [IU] via SUBCUTANEOUS

## 2019-10-26 MED ORDER — HEPARIN SOD (PORK) LOCK FLUSH 100 UNIT/ML IV SOLN
500.0000 [IU] | INTRAVENOUS | Status: DC | PRN
  Filled 2019-10-26: qty 5

## 2019-10-26 MED ORDER — INSULIN DETEMIR 100 UNIT/ML ~~LOC~~ SOLN
40.0000 [IU] | Freq: Two times a day (BID) | SUBCUTANEOUS | Status: DC
  Administered 2019-10-26: 40 [IU] via SUBCUTANEOUS
  Filled 2019-10-26 (×2): qty 0.4

## 2019-10-26 MED ORDER — HEPARIN SOD (PORK) LOCK FLUSH 100 UNIT/ML IV SOLN
500.0000 [IU] | INTRAVENOUS | Status: AC | PRN
  Administered 2019-10-26: 500 [IU]
  Filled 2019-10-26: qty 5

## 2019-10-26 NOTE — Progress Notes (Addendum)
NEURO HOSPITALIST PROGRESS NOTE   Subjective: Patient awake, alert, NAD in bed. No further seizures since starting keppra. Seizure precautions as written below given to patient and wife (via telephone). Both verbalized an understanding.   Exam: Vitals:   10/26/19 0403 10/26/19 0749  BP: (!) 132/101 126/86  Pulse: 90 91  Resp: 18 16  Temp: (!) 97.5 F (36.4 C) 98 F (36.7 C)  SpO2: 99% 99%    Physical Exam  Constitutional: Appears well-developed and well-nourished.  Psych: Affect appropriate to situation Eyes: Normal external eye and conjunctiva. HENT: Normocephalic, no lesions, without obvious abnormality.   Musculoskeletal-no joint tenderness, deformity or swelling Cardiovascular: Normal rate and regular rhythm.  Respiratory: Effort normal, non-labored breathing saturations WNL GI: Soft.  No distension. There is no tenderness.  Skin: WDI   Neuro:  Mental Status: Alert, oriented, thought content appropriate.  Speech fluent without evidence of aphasia.  Able to follow  commands without difficulty. Cranial Nerves: II:  Visual fields grossly normal,  III,IV, VI: ptosis not present, extra-ocular motions intact bilaterally pupils equal, round, reactive to light and accommodation V,VII: smile symmetric, facial light touch sensation normal bilaterally VIII: hearing normal bilaterally IX,X: uvula rises symmetrically XI: bilateral shoulder shrug XII: midline tongue extension Motor: Right : Upper extremity   5/5 Left:     Upper extremity   5/5  Lower extremity   5/5  Lower extremity   5/5 Tone and bulk:normal tone throughout; no atrophy noted Sensory: light touch intact throughout, bilaterally   Cerebellar: No ataxia Gait: deferred    Medications:  Scheduled: . aspirin EC  81 mg Oral Daily  . atorvastatin  80 mg Oral Daily  . Chlorhexidine Gluconate Cloth  6 each Topical Daily  . insulin aspart  0-5 Units Subcutaneous QHS  . insulin aspart  0-9  Units Subcutaneous TID WC  . insulin aspart  3 Units Subcutaneous TID WC  . insulin detemir  40 Units Subcutaneous BID  . levETIRAcetam  500 mg Oral BID  . levothyroxine  150 mcg Oral Q0600  . pantoprazole  40 mg Oral Daily  . sodium chloride flush  10-40 mL Intracatheter Q12H  . tamsulosin  0.4 mg Oral QPM  . venlafaxine XR  225 mg Oral Daily   Continuous: . sodium chloride     UUE:[id number], clonazePAM, dextrose, nitroGLYCERIN, sodium chloride flush  Pertinent Labs/Diagnostics:   MR BRAIN W WO CONTRAST  Result Date: 10/26/2019 CLINICAL DATA:  Seizure EXAM: MRI HEAD WITHOUT AND WITH CONTRAST TECHNIQUE: Multiplanar, multiecho pulse sequences of the brain and surrounding structures were obtained without and with intravenous contrast. CONTRAST:  66mL GADAVIST GADOBUTROL 1 MMOL/ML IV SOLN COMPARISON:  None. FINDINGS: BRAIN: No acute infarct, acute hemorrhage or extra-axial collection. Normal white matter signal. Bilateral middle cranial fossa arachnoid cysts. No chronic microhemorrhage. Normal midline structures. The hippocampi are normal and symmetric in size and signal. The hypothalamus and mamillary bodies are normal. There is no cortical ectopia or dysplasia. VASCULAR: Major flow voids are preserved. SKULL AND UPPER CERVICAL SPINE: Normal calvarium and skull base. Visualized upper cervical spine and soft tissues are normal. SINUSES/ORBITS: No paranasal sinus fluid levels or advanced mucosal thickening. No mastoid or middle ear effusion. Normal orbits. IMPRESSION: 1. No seizure etiology identified.  No acute abnormality. 2. Bilateral middle cranial fossa arachnoid cysts. Electronically Signed   By: Cletus Gash.D.  On: 10/26/2019 05:38   EEG adult  Result Date: 10/25/2019 Lora Havens, MD     10/25/2019  3:21 PM Patient Name: Jeff [name] MRN: [id number] Epilepsy Attending: Lora Havens Referring Physician/Provider: Dr Early Osmond Date:  10/25/2019 Duration: 25.52 mins  Patient history: 54 year old male with history of colon cancer currently on chemo presented with new onset seizure-like episode.  EEG to evaluate for seizures. Level of alertness: Awake, asleep AEDs during EEG study: None Technical aspects: This EEG study was done with scalp electrodes positioned according to the 10-20 International system of electrode placement. Electrical activity was acquired at a sampling rate of 500Hz  and reviewed with a high frequency filter of 70Hz  and a low frequency filter of 1Hz . EEG data were recorded continuously and digitally stored. Description: The posterior dominant rhythm consists of 8-9 Hz activity of moderate voltage (25-35 uV) seen predominantly in posterior head regions, symmetric and reactive to eye opening and eye closing.Sleep was characterized by vertex waves, sleep spindles (12 to 14 Hz), maximal frontocentral region.  EEG showed intermittent generalized 4 to 5 Hz theta slowing.  Frequent spikes were also noted in the right fronto central region, maximal F4. Physiology photic driving was not seen during photic stimulation.  Hyperventilation was not performed.   ABNORMALITY -Spikes, right frontocentral region, maximal F4 -Intermittent slow, generalized IMPRESSION: This study showed evidence of epileptogenicity arising from right frontocentral region as well as mild diffuse encephalopathy, nonspecific etiology.  No seizures were seen throughout the recording. Lora Havens   Assessment:  54 y.o. male  With PMH DM2,  colon cancer ( currently chemo cycle 4), graves disease s/p radioactive iodine, PVD (s/0 bypass graft 2017), HTN, chronic thrombocytopenia who presented to [facility] ED after seizure. EEG showed area of epileptogenicity arising from right frontocentral region. Will continue keppra twice daily. MRI was negative for any seizure etiology.    Recommendations:  -- continue Keppra 500 mg BID -- seizure precautions -- follow up outpatient  neurology -neurology signing off at this time please call with any questions or concerns.  Given to patient as follows: Per [facility] statutes, patients with seizures are not allowed to drive until  they have been seizure-free for six months. Use caution when using heavy equipment or power tools. Avoid working on ladders or at heights. Take showers instead of baths. Ensure the water temperature is not too high on the home water heater. Do not go swimming alone. When caring for infants or small children, sit down when holding, feeding, or changing them to minimize risk of injury to the child in the event you have a seizure.   Also, Maintain good sleep hygiene. Avoid alcohol.    Laurey Morale, MSN, NP-C [facility] [phone]      10/26/2019, 9:53 AM

## 2019-10-26 NOTE — Progress Notes (Signed)
Physical Therapy Note  Screened patient with static and dynamic balance challenges Berg 57, 4 stage balance - pass. He is currently functioning at a high level of independence and no physical therapy is indicated at this time. PT is signing-off. Please re-order if there is any significant change in status. Thank you for this referral.  Elayne Snare, [place]

## 2019-10-26 NOTE — Discharge Summary (Signed)
Physician Discharge Summary  Jeff [name] VQQ:[id number] DOB: 1965-10-27 DOA: 10/25/2019  PCP: Charlynn Court, NP  Admit date: 10/25/2019 Discharge date: 10/26/2019  Admitted From: home Disposition:  home  Recommendations for Outpatient Follow-up:  1. Follow up with PCP in 1-2 weeks 2. Home insulin has been increased to 44 units of Levemir twice daily (from 40) and 12 units NovoLog mealtime (from 10)  Home Health: None Equipment/Devices: None  Discharge Condition: Stable CODE STATUS: Full code Diet recommendation: Diabetic  HPI: Per admitting MD, Jeff [name] is a 54 y.o. male with medical history significant of colon cancer (currently on chemo-cycle 4), history of Graves' disease status post radioactive iodine, anxiety/depression, hypertension, PVD status post left lower extremity vascular bypass surgery in 2017, chronic thrombocytopenia, type 2 diabetes mellitus presented to [facility] ED by EMS after seizure. Patient received chemo earlier yesterday.  Family observed generalized tonic-clonic episode at home then 2 additional seizures were witnessed by EMS prompting administration of 5 mg IV Versed. Upon arrival to [facility] ED: Patient was found to be in DKA with initial blood sugar of 566 with bicarb of 10, gap of 28, pH of 7.2.  He was hypotensive with systolic BPs in the 43P which resolved with IV fluid bolus.  Patient started on insulin drip.  CT head and CT chest/abdomen/pelvis came back negative for acute findings or infection.  Patient was given 1 g of Keppra for seizure.  Patient transferred to [facility] for further evaluation and management. During my encounter: Patient resting comfortably on the bed.  Reports mild headache and jaw pain however denies dizziness, lightheadedness, chest pain, shortness of breath, abdominal pain, melena, hematemesis, fever, chills, cough, congestion, urinary changes.  He tells me that he has lost 5 pounds in 1 month.  Never had seizure  before.  Hospital Course / Discharge diagnoses: DKA in the setting of poorly controlled type 2 diabetes mellitus with hyperglycemia - patient was admitted to the hospital with DKA with bicarb of 10, gap of 28 and pH of 7.2.  He was placed on insulin infusion, his labs improved, and eventually was transitioned off to subcutaneous insulin.  Patient's hemoglobin A1c is in the 12 range.  Patient reports noncompliance with diabetic diet and drinking large amounts of regular soda.  He is not really watching the carbohydrates in his diet either.  Long discussion with patient and wife at bedside prior to discharge, first step would be to illuminate completely sugary drinks and this will have a significant impact in his diabetes management.  Meanwhile I have increased his Levemir as well as NovoLog by small increments as he reports his sugar is in the mid 200s in the morning and upper 300s in the evenings.  He does check his CBGs twice daily every day. New onset seizure -neurology consulted and followed patient, underwent an MRI which was essentially without acute findings in her EEG.  Neurology appreciated focal evidence of epileptogenicity and recommended Keppra 500 mg twice daily which were prescribed at the time of discharge. Metastatic colon cancer -he is to follow-up with oncology after discharge, he is getting chemotherapy as an outpatient Essential hypertension-resume home medications Depression/anxiety-continue Effexor, benzodiazepines.  I have noted that he is on Klonopin as well as Ativan, would avoid this combination in the future, discontinued Klonopin PVD-status post left lower extremity vascular bypass surgery in 2017 BPH-continue Flomax Chronic thrombocytopenia-no bleeding, likely due to chemo Coronary artery disease-no chest pain, continue home medications Acute kidney injury-creatinine has normalized  with fluids   Discharge Instructions  Discharge Instructions    Ambulatory referral to  Neurology   Complete by: As directed    An appointment is requested in approximately: 4 weeks     Allergies as of 10/26/2019      Reactions   Hydrocodone Hives      Medication List    STOP taking these medications   clonazePAM 1 MG tablet Commonly known as: KLONOPIN     TAKE these medications   Accu-Chek Softclix Lancets lancets USE  TID TO CHECK BLOOD SUGAR LEVELS   amLODipine 5 MG tablet Commonly known as: NORVASC TAKE 1 TABLET(5 MG) BY MOUTH DAILY What changed: See the new instructions.   aspirin EC 81 MG tablet Take 81 mg by mouth daily.   atorvastatin 80 MG tablet Commonly known as: LIPITOR TAKE 1 TABLET(80 MG) BY MOUTH EVERY EVENING What changed: See the new instructions.   docusate sodium 100 MG capsule Commonly known as: COLACE Take 100 mg by mouth 2 (two) times daily.   Farxiga 5 MG Tabs tablet Generic drug: dapagliflozin propanediol Take 5 mg by mouth daily.   insulin aspart 100 UNIT/ML injection Commonly known as: NovoLOG Inject 12 Units into the skin 3 (three) times daily before meals. What changed: how much to take   insulin detemir 100 UNIT/ML injection Commonly known as: LEVEMIR Inject 0.4 mLs (40 Units total) into the skin 2 (two) times daily.   levETIRAcetam 500 MG tablet Commonly known as: KEPPRA Take 1 tablet (500 mg total) by mouth 2 (two) times daily.   levothyroxine 150 MCG tablet Commonly known as: SYNTHROID Take 1 tablet by mouth once daily. **Must be seen in office for future refills**   lidocaine-prilocaine cream Commonly known as: EMLA Apply 1 application topically as needed. Apply to portacath site 1 hour prior to use   LORazepam 0.5 MG tablet Commonly known as: ATIVAN Take 1 tablet (0.5 mg total) by mouth every 8 (eight) hours as needed for anxiety or sleep (nausea).   meclizine 25 MG tablet Commonly known as: ANTIVERT Take 25 mg by mouth 3 (three) times daily as needed.   metFORMIN 500 MG 24 hr tablet Commonly known  as: GLUCOPHAGE-XR Take 1,000 mg by mouth in the morning and at bedtime.   metoprolol tartrate 50 MG tablet Commonly known as: LOPRESSOR TAKE 1/2 TABLET BY MOUTH TWICE DAILY   nitroGLYCERIN 0.4 MG SL tablet Commonly known as: NITROSTAT PLACE 1 TABLET UNDER THE TONGUE EVERY 5 MINUTES AS NEEDED FOR CHEST PAIN. What changed: See the new instructions.   nystatin 100000 UNIT/ML suspension Commonly known as: MYCOSTATIN Take 5 mLs (500,000 Units total) by mouth 4 (four) times daily.   oxyCODONE-acetaminophen 10-325 MG tablet Commonly known as: PERCOCET Take 1 tablet by mouth every 6 (six) hours as needed for pain (for back pain).   pantoprazole 40 MG tablet Commonly known as: PROTONIX Take 40 mg by mouth daily.   promethazine 12.5 MG tablet Commonly known as: PHENERGAN Take 1 tablet (12.5 mg total) by mouth every 6 (six) hours as needed. What changed: reasons to take this   tamsulosin 0.4 MG Caps capsule Commonly known as: FLOMAX Take 0.4 mg by mouth every evening.   venlafaxine XR 75 MG 24 hr capsule Commonly known as: EFFEXOR-XR Take 225 mg by mouth daily.      Consultations:  Neurology   Procedures/Studies:  MR BRAIN W WO CONTRAST  Result Date: 10/26/2019 CLINICAL DATA:  Seizure EXAM: MRI HEAD WITHOUT  AND WITH CONTRAST TECHNIQUE: Multiplanar, multiecho pulse sequences of the brain and surrounding structures were obtained without and with intravenous contrast. CONTRAST:  71mL GADAVIST GADOBUTROL 1 MMOL/ML IV SOLN COMPARISON:  None. FINDINGS: BRAIN: No acute infarct, acute hemorrhage or extra-axial collection. Normal white matter signal. Bilateral middle cranial fossa arachnoid cysts. No chronic microhemorrhage. Normal midline structures. The hippocampi are normal and symmetric in size and signal. The hypothalamus and mamillary bodies are normal. There is no cortical ectopia or dysplasia. VASCULAR: Major flow voids are preserved. SKULL AND UPPER CERVICAL SPINE: Normal  calvarium and skull base. Visualized upper cervical spine and soft tissues are normal. SINUSES/ORBITS: No paranasal sinus fluid levels or advanced mucosal thickening. No mastoid or middle ear effusion. Normal orbits. IMPRESSION: 1. No seizure etiology identified.  No acute abnormality. 2. Bilateral middle cranial fossa arachnoid cysts. Electronically Signed   By: Ulyses Jarred M.D.   On: 10/26/2019 05:38   EEG adult  Result Date: 10/25/2019 Lora Havens, MD     10/25/2019  3:21 PM Patient Name: Jeff [name] MRN: [id number] Epilepsy Attending: Lora Havens Referring Physician/Provider: Dr Early Osmond Date:  10/25/2019 Duration: 25.52 mins Patient history: 54 year old male with history of colon cancer currently on chemo presented with new onset seizure-like episode.  EEG to evaluate for seizures. Level of alertness: Awake, asleep AEDs during EEG study: None Technical aspects: This EEG study was done with scalp electrodes positioned according to the 10-20 International system of electrode placement. Electrical activity was acquired at a sampling rate of 500Hz  and reviewed with a high frequency filter of 70Hz  and a low frequency filter of 1Hz . EEG data were recorded continuously and digitally stored. Description: The posterior dominant rhythm consists of 8-9 Hz activity of moderate voltage (25-35 uV) seen predominantly in posterior head regions, symmetric and reactive to eye opening and eye closing.Sleep was characterized by vertex waves, sleep spindles (12 to 14 Hz), maximal frontocentral region.  EEG showed intermittent generalized 4 to 5 Hz theta slowing.  Frequent spikes were also noted in the right fronto central region, maximal F4. Physiology photic driving was not seen during photic stimulation.  Hyperventilation was not performed.   ABNORMALITY -Spikes, right frontocentral region, maximal F4 -Intermittent slow, generalized IMPRESSION: This study showed evidence of epileptogenicity arising from  right frontocentral region as well as mild diffuse encephalopathy, nonspecific etiology.  No seizures were seen throughout the recording. Priyanka Barbra Sarks     Subjective: - no chest pain, shortness of breath, no abdominal pain, nausea or vomiting.   Discharge Exam: BP 130/76 (BP Location: Left Arm)   Pulse 90   Temp 97.8 F (36.6 C)   Resp 16   Ht 5\' 10"  (1.778 m)   Wt 104.7 kg Comment: scale b  SpO2 99%   BMI 33.12 kg/m   General: Pt is alert, awake, not in acute distress Cardiovascular: RRR, S1/S2 +, no rubs, no gallops Respiratory: CTA bilaterally, no wheezing, no rhonchi Abdominal: Soft, NT, ND, bowel sounds + Extremities: no edema, no cyanosis   The results of significant diagnostics from this hospitalization (including imaging, microbiology, ancillary and laboratory) are listed below for reference.     Microbiology: No results found for this or any previous visit (from the past 240 hour(s)).   Labs: Basic Metabolic Panel: Recent Labs  Lab 10/24/19 0932 10/25/19 1754 10/25/19 2023 10/26/19 0121 10/26/19 0844  NA 133* 137 134* 139 137  K 4.5 4.2 4.4 4.0 3.8  CL 97* 104 104 105  104  CO2 17* 21* 22 21* 24  GLUCOSE 487* 218* 355* 164* 203*  BUN 11 16 15 14 11   CREATININE 1.60* 1.44* 1.62* 1.46* 1.24  CALCIUM 9.4 8.2* 8.1* 8.1* 8.1*  MG  --  2.6*  --   --   --    Liver Function Tests: Recent Labs  Lab 10/24/19 0932  AST 48*  ALT 72*  ALKPHOS 132*  BILITOT 0.4  PROT 8.2*  ALBUMIN 3.6   CBC: Recent Labs  Lab 10/24/19 0932 10/25/19 1754  WBC 7.3 8.0  NEUTROABS 3.7 5.5  HGB 14.5 12.9*  HCT 42.9 39.4  MCV 88.6 89.1  PLT 131* 111*   CBG: Recent Labs  Lab 10/26/19 0402 10/26/19 0527 10/26/19 0628 10/26/19 0747 10/26/19 1123  GLUCAP 183* 152* 141* 140* 303*   Hgb A1c Recent Labs    10/25/19 1419  HGBA1C 12.4*   Lipid Profile No results for input(s): CHOL, HDL, LDLCALC, TRIG, CHOLHDL, LDLDIRECT in the last 72 hours. Thyroid function  studies Recent Labs    10/25/19 1434  TSH 0.251*   Urinalysis    Component Value Date/Time   COLORURINE YELLOW 01/19/2016 0842   APPEARANCEUR CLEAR 01/19/2016 0842   LABSPEC 1.027 01/19/2016 0842   PHURINE 7.0 01/19/2016 0842   GLUCOSEU NEGATIVE 01/19/2016 0842   HGBUR NEGATIVE 01/19/2016 0842   BILIRUBINUR NEGATIVE 01/19/2016 0842   KETONESUR NEGATIVE 01/19/2016 0842   PROTEINUR NEGATIVE 10/10/2019 0855   UROBILINOGEN 0.2 01/06/2015 0547   NITRITE NEGATIVE 01/19/2016 0842   LEUKOCYTESUR NEGATIVE 01/19/2016 0842    FURTHER DISCHARGE INSTRUCTIONS:   Get Medicines reviewed and adjusted: Please take all your medications with you for your next visit with your Primary MD   Laboratory/radiological data: Please request your Primary MD to go over all hospital tests and procedure/radiological results at the follow up, please ask your Primary MD to get all Hospital records sent to his/her office.   In some cases, they will be blood work, cultures and biopsy results pending at the time of your discharge. Please request that your primary care M.D. goes through all the records of your hospital data and follows up on these results.   Also Note the following: If you experience worsening of your admission symptoms, develop shortness of breath, life threatening emergency, suicidal or homicidal thoughts you must seek medical attention immediately by calling 911 or calling your MD immediately  if symptoms less severe.   You must read complete instructions/literature along with all the possible adverse reactions/side effects for all the Medicines you take and that have been prescribed to you. Take any new Medicines after you have completely understood and accpet all the possible adverse reactions/side effects.    Do not drive when taking Pain medications or sleeping medications (Benzodaizepines)   Do not take more than prescribed Pain, Sleep and Anxiety Medications. It is not advisable to combine  anxiety,sleep and pain medications without talking with your primary care practitioner   Special Instructions: If you have smoked or chewed Tobacco  in the last 2 yrs please stop smoking, stop any regular Alcohol  and or any Recreational drug use.   Wear Seat belts while driving.   Please note: You were cared for by a hospitalist during your hospital stay. Once you are discharged, your primary care physician will handle any further medical issues. Please note that NO REFILLS for any discharge medications will be authorized once you are discharged, as it is imperative that you return to your  primary care physician (or establish a relationship with a primary care physician if you do not have one) for your post hospital discharge needs so that they can reassess your need for medications and monitor your lab values.  Time coordinating discharge: 45 minutes  SIGNED:  Marzetta Board, MD, PhD 10/26/2019, 12:56 PM

## 2019-10-26 NOTE — Evaluation (Addendum)
Occupational Therapy Evaluation Patient Details Name: Jeff [name] MRN: [id number] DOB: [birth year] Today's Date: 10/26/2019    History of Present Illness Pt is a 54 y.o. male with PMH DM2,  colon cancer (currently chemo cycle 4), graves disease s/p radioactive iodine, PVD (s/p bypass graft 2017), HTN, chronic thrombocytopenia who presented to [facility] ED after seizure and was subsequently transferred to [facility]. EEG shows evidence of epileptogenicity arising from right frontocentral region as well as mild diffuse encephalopathy, nonspecific etiology.     Clinical Impression   This 54 y/o male presents with the above. PTA pt reports independence with ADL, iADL and functional mobility. Today pt performing room level mobility (x2 laps) pushing IV pole at minguard assist level. Pt initially with unsteadiness upon initial stand which improves with increased time/mobility. He currently is at [facility] assist level for ADL tasks. HR up to 113bpm with activity. Pt reports he lives with spouse who works during the day. He reports he feels he is nearing his baseline with ADL/mobility tasks. Pt will benefit from continued acute OT services to further address balance and to maximize his overall safety and independence with ADL and mobility tasks. Do not anticipate pt will requiring follow up OT services after discharge.     Follow Up Recommendations  No OT follow up;Supervision - Intermittent    Equipment Recommendations  None recommended by OT           Precautions / Restrictions Precautions Precautions: Fall;Other (comment) Precaution Comments: seizure Restrictions Weight Bearing Restrictions: No      Mobility Bed Mobility Overal bed mobility: Modified Independent             General bed mobility comments: HOB slightly elevated   Transfers Overall transfer level: Needs assistance Equipment used: None Transfers: Sit to/from Stand Sit to Stand: Min guard         General  transfer comment: for balance and safety, pt unsteady with initial stand, improved with repetition (pt completing x3-4 stands during session)    Balance Overall balance assessment: Needs assistance Sitting-balance support: Feet supported Sitting balance-Leahy Scale: Good     Standing balance support: Single extremity supported;No upper extremity supported;During functional activity Standing balance-Leahy Scale: Fair                             ADL either performed or assessed with clinical judgement   ADL Overall ADL's : Needs assistance/impaired Eating/Feeding: Modified independent;Sitting   Grooming: Min guard;Standing   Upper Body Bathing: Set up;Supervision/ safety;Sitting   Lower Body Bathing: Min guard;Sit to/from stand   Upper Body Dressing : Set up;Sitting   Lower Body Dressing: Min guard;Sit to/from stand Lower Body Dressing Details (indicate cue type and reason): minguard assist for standing balance  Toilet Transfer: Min guard;Ambulation Toilet Transfer Details (indicate cue type and reason): simulated via transfer to/from EOB, x2 laps in room pushing IV pole  Toileting- Clothing Manipulation and Hygiene: Min guard;Sitting/lateral lean;Sit to/from stand Toileting - Clothing Manipulation Details (indicate cue type and reason): pt has been using urinal ad lib      Functional mobility during ADLs: Min guard (IV pole) General ADL Comments: pt with mild unsteadiness which improves with increased time on his feet (slower/guarded with turns)      Vision Baseline Vision/History: Wears glasses Wears Glasses: Reading only Patient Visual Report: No change from baseline Vision Assessment?: No apparent visual deficits Additional Comments: will continue to assess, pt able to  read clock in room without difficulty      Perception     Praxis      Pertinent Vitals/Pain Pain Assessment: No/denies pain     Hand Dominance Right   Extremity/Trunk Assessment  Upper Extremity Assessment Upper Extremity Assessment: RUE deficits/detail RUE Deficits / Details: baseline R shoulder pain/weakness (grossly 4-/5), otherwise [facility]    Lower Extremity Assessment Lower Extremity Assessment: Defer to PT evaluation       Communication Communication Communication: No difficulties   Cognition Arousal/Alertness: Awake/alert Behavior During Therapy: WFL for tasks assessed/performed Overall Cognitive Status: Within Functional Limits for tasks assessed                                     General Comments  max HR noted 113 bpm with activity     Exercises     Shoulder Instructions      Home Living Family/patient expects to be discharged to:: Private residence Living Arrangements: Spouse/significant other Available Help at Discharge: Family;Available PRN/intermittently Type of Home: Apartment Home Access: Level entry     Home Layout: One level     Bathroom Shower/Tub: Teacher, early years/pre: Standard     Home Equipment: None          Prior Functioning/Environment Level of Independence: Independent        Comments: typically drives, not working due to baseline "bad back"         OT Problem List: Decreased strength;Decreased range of motion;Decreased activity tolerance;Impaired balance (sitting and/or standing)      OT Treatment/Interventions: Self-care/ADL training;Therapeutic exercise;Energy conservation;DME and/or AE instruction;Therapeutic activities;Patient/family education;Balance training    OT Goals(Current goals can be found in the care plan section) Acute Rehab OT Goals Patient Stated Goal: return home, to independent level  OT Goal Formulation: With patient Time For Goal Achievement: 11/09/19 Potential to Achieve Goals: Good  OT Frequency: Min 2X/week   Barriers to D/C:            Co-evaluation              AM-PAC OT "6 Clicks" Daily Activity     Outcome Measure Help from another  person eating meals?: None Help from another person taking care of personal grooming?: A Little Help from another person toileting, which includes using toliet, bedpan, or urinal?: A Little Help from another person bathing (including washing, rinsing, drying)?: A Little Help from another person to put on and taking off regular upper body clothing?: A Little Help from another person to put on and taking off regular lower body clothing?: A Little 6 Click Score: 19   End of Session Equipment Utilized During Treatment: Gait belt Nurse Communication: Mobility status  Activity Tolerance: Patient tolerated treatment well Patient left: in bed;with call bell/phone within reach;with bed alarm set  OT Visit Diagnosis: Other symptoms and signs involving the nervous system (R29.898);Unsteadiness on feet (R26.81)                Time: 5035-4656 OT Time Calculation (min): 15 min Charges:  OT General Charges $OT Visit: 1 Visit OT Evaluation $OT Eval Moderate Complexity: [place], OT Acute Rehabilitation Services Pager [phone] Office [phone]  Raymondo Band 10/26/2019, 9:37 AM

## 2019-10-28 ENCOUNTER — Telehealth: Payer: Self-pay | Admitting: *Deleted

## 2019-10-28 ENCOUNTER — Encounter: Payer: Self-pay | Admitting: Neurology

## 2019-10-28 NOTE — Telephone Encounter (Signed)
Called wife to f/u on how Jeff [name] is doing since leaving hospital. She said he feels well. Blood sugars are in 200's, but this is better than it was. Is now on Keppra for seizure prevention. Appreciates the f/u call.

## 2019-11-01 MED FILL — TAMSULOSIN HCL 0.4 MG CAP: 0.4 | 30 days supply | Qty: 30 | Fill #6

## 2019-11-01 NOTE — Progress Notes (Signed)
Pharmacist Chemotherapy Monitoring - Follow Up Assessment    I verify that I have reviewed each item in the below checklist:  . Regimen for the patient is scheduled for the appropriate day and plan matches scheduled date. Marland Kitchen Appropriate non-routine labs are ordered dependent on drug ordered. . If applicable, additional medications reviewed and ordered per protocol based on lifetime cumulative doses and/or treatment regimen.   Plan for follow-up and/or issues identified: Yes . I-vent associated with next due treatment: Yes . MD and/or nursing notified: No   Kennith Center, Pharm.D., CPP 11/01/2019@2 :08 PM

## 2019-11-02 ENCOUNTER — Other Ambulatory Visit: Payer: Self-pay | Admitting: Oncology

## 2019-11-06 ENCOUNTER — Ambulatory Visit: Payer: Medicaid Other

## 2019-11-06 ENCOUNTER — Other Ambulatory Visit: Payer: Medicaid Other

## 2019-11-06 ENCOUNTER — Ambulatory Visit: Payer: Medicaid Other | Admitting: Oncology

## 2019-11-07 ENCOUNTER — Inpatient Hospital Stay: Payer: Medicaid Other

## 2019-11-07 ENCOUNTER — Ambulatory Visit: Payer: Medicaid Other | Admitting: Nutrition

## 2019-11-07 ENCOUNTER — Inpatient Hospital Stay (HOSPITAL_BASED_OUTPATIENT_CLINIC_OR_DEPARTMENT_OTHER): Payer: Medicaid Other | Admitting: Nurse Practitioner

## 2019-11-07 ENCOUNTER — Encounter: Payer: Self-pay | Admitting: Nurse Practitioner

## 2019-11-07 ENCOUNTER — Other Ambulatory Visit: Payer: Self-pay

## 2019-11-07 VITALS — BP 131/94 | HR 82 | Temp 98.0°F | Resp 16

## 2019-11-07 DIAGNOSIS — Z95828 Presence of other vascular implants and grafts: Secondary | ICD-10-CM

## 2019-11-07 DIAGNOSIS — C187 Malignant neoplasm of sigmoid colon: Secondary | ICD-10-CM

## 2019-11-07 DIAGNOSIS — Z5112 Encounter for antineoplastic immunotherapy: Secondary | ICD-10-CM | POA: Diagnosis not present

## 2019-11-07 DIAGNOSIS — C189 Malignant neoplasm of colon, unspecified: Secondary | ICD-10-CM | POA: Diagnosis not present

## 2019-11-07 DIAGNOSIS — C787 Secondary malignant neoplasm of liver and intrahepatic bile duct: Secondary | ICD-10-CM

## 2019-11-07 LAB — CMP (CANCER CENTER ONLY)
ALT: 58 U/L — ABNORMAL HIGH (ref 0–44)
AST: 46 U/L — ABNORMAL HIGH (ref 15–41)
Albumin: 3.5 g/dL (ref 3.5–5.0)
Alkaline Phosphatase: 110 U/L (ref 38–126)
Anion gap: 17 — ABNORMAL HIGH (ref 5–15)
BUN: 7 mg/dL (ref 6–20)
CO2: 18 mmol/L — ABNORMAL LOW (ref 22–32)
Calcium: 8.5 mg/dL — ABNORMAL LOW (ref 8.9–10.3)
Chloride: 99 mmol/L (ref 98–111)
Creatinine: 1.33 mg/dL — ABNORMAL HIGH (ref 0.61–1.24)
GFR, Est AFR Am: >60 mL/min (ref >60)
GFR, Estimated: >60 mL/min (ref >60)
Glucose, Bld: 337 mg/dL — ABNORMAL HIGH (ref 70–99)
Potassium: 3.8 mmol/L (ref 3.5–5.1)
Sodium: 134 mmol/L — ABNORMAL LOW (ref 135–145)
Total Bilirubin: 0.5 mg/dL (ref 0.3–1.2)
Total Protein: 7.4 g/dL (ref 6.5–8.1)

## 2019-11-07 LAB — CBC WITH DIFFERENTIAL (CANCER CENTER ONLY)
Abs Immature Granulocytes: 0.01 10*3/uL (ref 0.00–0.07)
Basophils Absolute: 0 10*3/uL (ref 0.0–0.1)
Basophils Relative: 0 %
Eosinophils Absolute: 0.1 10*3/uL (ref 0.0–0.5)
Eosinophils Relative: 2 %
HCT: 42 % (ref 39.0–52.0)
Hemoglobin: 13.4 g/dL (ref 13.0–17.0)
Immature Granulocytes: 0 %
Lymphocytes Relative: 36 %
Lymphs Abs: 2 10*3/uL (ref 0.7–4.0)
MCH: 29 pg (ref 26.0–34.0)
MCHC: 31.9 g/dL (ref 30.0–36.0)
MCV: 90.9 fL (ref 80.0–100.0)
Monocytes Absolute: 0.3 10*3/uL (ref 0.1–1.0)
Monocytes Relative: 6 %
Neutro Abs: 3.1 10*3/uL (ref 1.7–7.7)
Neutrophils Relative %: 56 %
Platelet Count: 155 10*3/uL (ref 150–400)
RBC: 4.62 MIL/uL (ref 4.22–5.81)
RDW: 14.9 % (ref 11.5–15.5)
WBC Count: 5.6 10*3/uL (ref 4.0–10.5)
nRBC: 0 % (ref 0.0–0.2)

## 2019-11-07 LAB — CEA (IN HOUSE-CHCC): CEA (CHCC-In House): 13.47 ng/mL — ABNORMAL HIGH (ref 0.00–5.00)

## 2019-11-07 LAB — TOTAL PROTEIN, URINE DIPSTICK: Protein, ur: NEGATIVE mg/dL

## 2019-11-07 LAB — GLUCOSE, CAPILLARY: Glucose-Capillary: 187 mg/dL — ABNORMAL HIGH (ref 70–99)

## 2019-11-07 MED ORDER — LORAZEPAM 0.5 MG PO TABS: 0.5000 mg | ORAL_TABLET | Freq: Three times a day (TID) | ORAL | 0 refills | Status: DC | PRN

## 2019-11-07 MED ORDER — SODIUM CHLORIDE 0.9 % IV SOLN
Freq: Once | INTRAVENOUS | Status: AC
  Filled 2019-11-07: qty 250

## 2019-11-07 MED ORDER — SODIUM CHLORIDE 0.9 % IV SOLN
5.0000 mg/kg | Freq: Once | INTRAVENOUS | Status: AC
  Administered 2019-11-07: 500 mg via INTRAVENOUS
  Filled 2019-11-07: qty 4

## 2019-11-07 MED ORDER — PALONOSETRON HCL INJECTION 0.25 MG/5ML
0.2500 mg | Freq: Once | INTRAVENOUS | Status: AC
  Administered 2019-11-07: 0.25 mg via INTRAVENOUS

## 2019-11-07 MED ORDER — SODIUM CHLORIDE 0.9% FLUSH
10.0000 mL | INTRAVENOUS | Status: DC | PRN
  Administered 2019-11-07: 10 mL
  Filled 2019-11-07: qty 10

## 2019-11-07 MED ORDER — PALONOSETRON HCL INJECTION 0.25 MG/5ML
INTRAVENOUS | Status: AC
  Filled 2019-11-07: qty 5

## 2019-11-07 MED ORDER — SODIUM CHLORIDE 0.9 % IV SOLN
150.0000 mg | Freq: Once | INTRAVENOUS | Status: AC
  Administered 2019-11-07: 150 mg via INTRAVENOUS
  Filled 2019-11-07: qty 150

## 2019-11-07 MED ORDER — SODIUM CHLORIDE 0.9 % IV SOLN
180.0000 mg/m2 | Freq: Once | INTRAVENOUS | Status: AC
  Administered 2019-11-07: 420 mg via INTRAVENOUS
  Filled 2019-11-07: qty 15

## 2019-11-07 MED ORDER — ATROPINE SULFATE 1 MG/ML IJ SOLN
0.5000 mg | Freq: Once | INTRAMUSCULAR | Status: AC | PRN
  Administered 2019-11-07: 0.5 mg via INTRAVENOUS

## 2019-11-07 MED ORDER — ATROPINE SULFATE 1 MG/ML IJ SOLN
INTRAMUSCULAR | Status: AC
  Filled 2019-11-07: qty 1

## 2019-11-07 MED ORDER — DEXAMETHASONE SODIUM PHOSPHATE 10 MG/ML IJ SOLN
5.0000 mg | Freq: Once | INTRAMUSCULAR | Status: AC
  Administered 2019-11-07: 5 mg via INTRAVENOUS

## 2019-11-07 MED ORDER — SODIUM CHLORIDE 0.9 % IV SOLN
2000.0000 mg/m2 | INTRAVENOUS | Status: DC
  Administered 2019-11-07: 4550 mg via INTRAVENOUS
  Filled 2019-11-07: qty 91

## 2019-11-07 MED ORDER — SODIUM CHLORIDE 0.9 % IV SOLN
400.0000 mg/m2 | Freq: Once | INTRAVENOUS | Status: AC
  Administered 2019-11-07: 912 mg via INTRAVENOUS
  Filled 2019-11-07: qty 45.6

## 2019-11-07 MED ORDER — SODIUM CHLORIDE 0.9% FLUSH
10.0000 mL | INTRAVENOUS | Status: DC | PRN
  Administered 2019-11-07: 10 mL via INTRAVENOUS
  Filled 2019-11-07: qty 10

## 2019-11-07 MED ORDER — DEXAMETHASONE SODIUM PHOSPHATE 10 MG/ML IJ SOLN
INTRAMUSCULAR | Status: AC
  Filled 2019-11-07: qty 1

## 2019-11-07 MED ORDER — FLUOROURACIL CHEMO INJECTION 2.5 GM/50ML
400.0000 mg/m2 | Freq: Once | INTRAVENOUS | Status: AC
  Administered 2019-11-07: 900 mg via INTRAVENOUS
  Filled 2019-11-07: qty 18

## 2019-11-07 NOTE — Progress Notes (Addendum)
[facility] OFFICE PROGRESS NOTE   Diagnosis: Colon cancer  INTERVAL HISTORY:   [name] returns as scheduled.  He completed cycle 4 FOLFIRI/bevacizumab 10/24/2019.  He was admitted 10/25/2019 with DKA and new onset seizure.  Insulin adjusted, Keppra initiated.  He was discharged home 10/26/2019.  He has had no further seizure since discharge home. Wife reports blood sugars recently range from the 100s to the mid 350s. He notes that Ativan helps with anticipatory nausea. Earlier in the week he noted blood on the toilet tissue after a bowel movement. He describes the stool as "hard". No other bleeding. He wonders if he has hemorrhoids.  Objective:  Vital signs in last 24 hours:  There were no vitals taken for this visit.    HEENT: No thrush or ulcers. Resp: Lungs clear bilaterally. Cardio: Regular rate and rhythm. GI: Abdomen soft and nontender. No hepatomegaly. No external hemorrhoids. No rectal mass. Vascular: No leg edema. Neuro: Alert and oriented.  Port-A-Cath without erythema.  Lab Results:  Lab Results  Component Value Date   WBC 5.6 11/07/2019   HGB 13.4 11/07/2019   HCT 42.0 11/07/2019   MCV 90.9 11/07/2019   PLT 155 11/07/2019   NEUTROABS 3.1 11/07/2019    Imaging:  No results found.  Medications: I have reviewed the patient's current medications.  Assessment/Plan: 1. 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 CTschest, abdomen, and pelvis on 12/11/2015-indeterminate tiny pulmonary nodules, multiple liver metastases, small nodes in the sigmoid mesocolon  3. Laparoscopic sigmoid colectomy, liver biopsy, Port-A-Cath placement 01/14/2016  4. Pathology sigmoid colon resection-colonic adenocarcinoma, 5.3 cm extending into pericolonic connective tissue, positive lymph-vascular invasion, positive perineural invasion, negative margins, metastatic carcinoma  in9 of 28 lymph nodes  5. Pathology liver biopsy-metastatic colorectal adenocarcinoma  6. MSI stable; mismatch repair protein normal  7. 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 Cycle 1 FOLFOX 02/02/2016  9. Cycle 2 FOLFOX 02/15/2016  10. Cycle 3 FOLFOX 02/29/2016  11. Cycle 4 FOLFOX 03/14/2016  12. Cycle 5 FOLFOX 03/28/2016  13. Cycle 6 FOLFOX 04/11/2016 (oxaliplatin held secondary to thrombocytopenia)  14. 04/21/2016 restaging CTs-difficulty evaluating liver lesions due to hepatic steatosis. Stable right adrenal nodule. No adenopathy or local recurrence near the rectosigmoid anastomotic site.  15. Cycle 7 FOLFOX 04/25/2016  16. MRI liver 05/02/2016-partial improvement in hepatic metastases  17. Cycle 8 FOLFOX 05/10/2016  18. Cycle 9 FOLFOX 05/23/2016 (oxaliplatin held due to thrombocytopenia)  19. Cycle 10 FOLFOX 06/06/2016 (oxaliplatin dose reduced due to thrombocytopenia)  20. Cycle 11 FOLFOX 06/27/2016 (oxaliplatin held due to neuropathy)  21. Cycle 12 FOLFOX 07/11/2016 (oxaliplatin held) 22. Initiation of maintenance Xeloda 7 days on/7 days off 07/27/2016 23. MRI liver 11/18/2016-decrease in hepatic metastatic disease. No new or progressive disease identified within the abdomen. 24. Continuation of Xeloda 7 days on/7 days off 25. MRI liver 04/27/2017-previous liver lesions not identified, no new lesions, no lymphadenopathy 26. Xeloda continued 7 days on/7 days off 27. MRI liver 12/04/2017 -no evidence of metastatic disease, hepatic steatosis 28. Xeloda continued 7 days on/7 days off 29. MRI liver 07/15/2018-no evidence of metastatic disease. Stable severe hepatic steatosis. 30. Xeloda continued 7 days on/7 days off 31. MRI liver 03/16/2019-hepatic steatosis, no liver mass, focal area of intrahepatic biliary dilatation in segments 2 and 3 of the left lobe-increased 32. Xeloda continued 7 days on/7 days off 33. MRI abdomen 08/19/2019-no findings to suggest liver  metastases.  Bilateral lung nodules measuring up to 2.8 cm, progressive and more conspicuous than on previous exam 34. CT chest 08/29/2019-multiple pulmonary metastases, new from 04/21/2016 35. Cycle 1 FOLFIRI/bevacizumab 09/09/2019 36. Cycle 2 FOLFIRI/bevacizumab 09/26/2019 37. Cycle 3 FOLFIRI/bevacizumab 10/10/2019 38. Cycle 4 FOLFIRI/bevacizumab 10/24/2019 39. Cycle 5 FOLFIRI/bevacizumab 11/07/2019  2. Rectal bleeding and constipation secondary to #1  3. History of peripheral vascular disease, status post left lower extremity vascular bypass surgery in April 2017  4. History of nephrolithiasis  5. History of Graves' disease treated with radioactive iodine  6. Anxiety/depression  7. Hypertension  8. Hospitalization 01/19/2016 with wound dehiscence status post secondary suture closure of abdominal wall  9. 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 Hyperglycemia 06/20/2016-diagnosed with diabetes, maintained on insulin  11.Positive COVID test 12/13/2018  12. Hospitalized with seizure activity/DKA. Now on Keppra, insulin adjusted. 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   Disposition: Mr. Corcoran appears stable. He has completed 4 cycles of FOLFIRI/bevacizumab. Plan to proceed with cycle 5 today as scheduled. Restaging chest CT prior to next appointment.  We reviewed the CBC from today. Counts adequate for treatment.   We discussed the importance of good control of blood sugars. He will monitor closely, insulin as prescribed. He has instructions to increase the NovoLog insulin dose around the time of chemotherapy.  No further seizures since hospital discharge. He continues Keppra. He has an appointment with neurology next week.  He will return for lab, follow-up, chemotherapy in  2 weeks with a chest CT a few days prior. He will contact the office in the interim with any problems.  Patient seen with Dr. Benay Spice.  Addendum 1:25 PM -blood sugar is 337.  He reports taking Tresiba insulin and 18 units of NovoLog about 2 hours ago.  We will repeat his blood sugar in an hour.  He was observed getting a [employer] out of his bag.  We provided education regarding his diet.  We will ask the [place] dietitian to meet with him as well.    Ned Card ANP/GNP-BC   11/07/2019  1:07 PM This was a shared visit with Ned Card.  Mr. Grape has completed 4 cycles of FOLFIRI/Avastin.  He was admitted with a seizure following cycle 4.  The etiology of the seizure is unclear, but I suspect this was related to diabetic ketoacidosis.  I doubt the seizure was related to the cancer diagnosis.  We stressed the importance of good blood sugar control to Mr. Houchin and his wife.  Mr. Mattern will undergo restaging CTs after this cycle of chemotherapy.  Julieanne Manson, MD

## 2019-11-07 NOTE — Progress Notes (Signed)
1430 CBG 187

## 2019-11-07 NOTE — Patient Instructions (Signed)
[facility] Discharge Instructions for Patients Receiving Chemotherapy  Today you received the following chemotherapy agents: Bevacizumab, Irinotecan, Leucovorin, 5FU  To help prevent nausea and vomiting after your treatment, we encourage you to take your nausea medication as directed.    If you develop nausea and vomiting that is not controlled by your nausea medication, call the clinic.   BELOW ARE SYMPTOMS THAT SHOULD BE REPORTED IMMEDIATELY:  *FEVER GREATER THAN 100.5 F  *CHILLS WITH OR WITHOUT FEVER  NAUSEA AND VOMITING THAT IS NOT CONTROLLED WITH YOUR NAUSEA MEDICATION  *UNUSUAL SHORTNESS OF BREATH  *UNUSUAL BRUISING OR BLEEDING  TENDERNESS IN MOUTH AND THROAT WITH OR WITHOUT PRESENCE OF ULCERS  *URINARY PROBLEMS  *BOWEL PROBLEMS  UNUSUAL RASH Items with * indicate a potential emergency and should be followed up as soon as possible.  Feel free to call the clinic should you have any questions or concerns. The clinic phone number is [phone].  Please show the CHEMO ALERT CARD at check-in to the Emergency Department and triage nurse.   

## 2019-11-07 NOTE — Progress Notes (Signed)
Provider asked RD to give patient brief information on no added sugar diet secondary to increased CBGs.  Patient was drinking regular soft drinks.  Reports he is on insulin.  Provided education on no added sugar diet especially before during and right after treatment.  Explained to contact his physician who prescribes his insulin if blood sugars do not improve after complying with no added sugar, carbohydrate controlled diet.  Provided fact sheet with contact information.  Patient denies questions and concerns.

## 2019-11-08 ENCOUNTER — Telehealth: Payer: Self-pay | Admitting: Nurse Practitioner

## 2019-11-08 NOTE — Telephone Encounter (Signed)
Scheduled per 6/24 los. Noted to give pt updated calendar on next visit.

## 2019-11-09 ENCOUNTER — Other Ambulatory Visit: Payer: Self-pay

## 2019-11-09 ENCOUNTER — Inpatient Hospital Stay: Payer: Medicaid Other

## 2019-11-09 VITALS — BP 131/96 | HR 91 | Temp 98.3°F | Resp 18

## 2019-11-09 DIAGNOSIS — Z5112 Encounter for antineoplastic immunotherapy: Secondary | ICD-10-CM | POA: Diagnosis not present

## 2019-11-09 DIAGNOSIS — C187 Malignant neoplasm of sigmoid colon: Secondary | ICD-10-CM

## 2019-11-09 MED ORDER — HEPARIN SOD (PORK) LOCK FLUSH 100 UNIT/ML IV SOLN
500.0000 [IU] | Freq: Once | INTRAVENOUS | Status: AC | PRN
  Administered 2019-11-09: 500 [IU]
  Filled 2019-11-09: qty 5

## 2019-11-09 MED ORDER — SODIUM CHLORIDE 0.9% FLUSH
10.0000 mL | INTRAVENOUS | Status: DC | PRN
  Administered 2019-11-09: 10 mL
  Filled 2019-11-09: qty 10

## 2019-11-10 ENCOUNTER — Other Ambulatory Visit: Payer: Self-pay | Admitting: Cardiovascular Disease

## 2019-11-10 DIAGNOSIS — I10 Essential (primary) hypertension: Secondary | ICD-10-CM

## 2019-11-11 NOTE — Telephone Encounter (Signed)
Refill request

## 2019-11-14 ENCOUNTER — Ambulatory Visit
Admission: RE | Admit: 2019-11-14 | Discharge: 2019-11-14 | Disposition: A | Discharge status: Home / Self Care | Payer: Medicaid Other | Source: Ambulatory Visit | Attending: Neurology | Admitting: Neurology

## 2019-11-14 ENCOUNTER — Encounter: Payer: Self-pay | Admitting: Neurology

## 2019-11-14 ENCOUNTER — Other Ambulatory Visit: Payer: Self-pay

## 2019-11-14 ENCOUNTER — Encounter: Payer: Self-pay | Admitting: Surgery

## 2019-11-14 ENCOUNTER — Ambulatory Visit (INDEPENDENT_AMBULATORY_CARE_PROVIDER_SITE_OTHER): Payer: Medicaid Other | Admitting: Neurology

## 2019-11-14 ENCOUNTER — Ambulatory Visit (HOSPITAL_COMMUNITY)
Admission: RE | Admit: 2019-11-14 | Discharge: 2019-11-14 | Disposition: A | Discharge status: Home / Self Care | Payer: Medicaid Other | Source: Ambulatory Visit | Attending: Nurse Practitioner | Admitting: Nurse Practitioner

## 2019-11-14 VITALS — BP 149/110 | HR 118 | Ht 70.0 in | Wt 232.0 lb

## 2019-11-14 DIAGNOSIS — G40009 Localization-related (focal) (partial) idiopathic epilepsy and epileptic syndromes with seizures of localized onset, not intractable, without status epilepticus: Secondary | ICD-10-CM | POA: Diagnosis not present

## 2019-11-14 DIAGNOSIS — C787 Secondary malignant neoplasm of liver and intrahepatic bile duct: Secondary | ICD-10-CM | POA: Diagnosis present

## 2019-11-14 DIAGNOSIS — M25512 Pain in left shoulder: Secondary | ICD-10-CM

## 2019-11-14 DIAGNOSIS — C189 Malignant neoplasm of colon, unspecified: Secondary | ICD-10-CM | POA: Insufficient documentation

## 2019-11-14 DIAGNOSIS — M25511 Pain in right shoulder: Secondary | ICD-10-CM

## 2019-11-14 MED ORDER — LEVETIRACETAM 500 MG PO TABS: 500.0000 mg | ORAL_TABLET | Freq: Two times a day (BID) | ORAL | 3 refills | Status: DC

## 2019-11-14 NOTE — Progress Notes (Signed)
NEUROLOGY CONSULTATION NOTE  Jeff [name] MRN: [id number] DOB: [birth year]  Referring provider: Dr. Marzetta Board Primary care provider: Lucita Lora, NP  Reason for consult:  seizure  Dear Dr Cruzita Lederer:  Thank you for your kind referral of SAVEON PLANT for consultation of the above symptoms. Although his history is well known to you, please allow me to reiterate it for the purpose of our medical record. The patient was accompanied to the clinic by his wife who also provides collateral information. Records and images were personally reviewed where available.   HISTORY OF PRESENT ILLNESS: This is a 54 year old right-handed man with a history of hypertension, diabetes, colon cancer on chemotherapy, Graves disease s/p radioactive iodine, PVD, s/p bypass graft, chronic thrombocytopenia, presenting for evaluation of new onset seizure last 10/25/2019. He received chemotherapy then later that evening he lay down at bed then came to the kitchen twice, acting unusually. His wife reports he opened his chemo pouch and was messing with his port. She kept reminding him to stop touching it, he said "yeah," and she asked if it was hurting. He went back to the bedroom and she said good night, then heard a noise and found him unresponsive. He started yelling, both hands were flexed to his face that he scratched his face. He was turned to his right side and confused after. The seizure lasted a few minutes. He had 2 more seizures witnessed by EMS and was given Versed. He was initially brought to [facility] where glucose level was 566, he was hypotensive with SBP in the 80s and transferred to [facility]. He had an MRI brain which did not show any acute changes. His EEG showed intermittent generalized 4-5 Hz theta slowing with frequent spikes over the right frontocentral region, maximal at F4. He was discharged home on Levetiracetam 500mg  BID, he denies any side effects. He and his wife deny any prior history of  seizures but recall 2 episodes of loss of consciousness, he was in thyroid storm one time. Another time 11 years ago he lost his memory, he was going to [facility] but ended up going to [place], he passed out and then apparently walked out of the hospital, they found him on a train track. His ammonia level was high that time. She denies any staring/unresponsive episodes. He denies any gaps in time. Sometimes he smells Clorox or hand sanitizer. He has been very sensitive to smells, smelling Clorox since diagnosed with cancer. He has hypnic jerks in sleep that wake him up, none during the day.   Since the seizure, he has had bilateral shoulder/arm pain, with sharp pains when he tries to lift them up. His glucose levels are better with insulin dose changes. He has paresthesias in both feet. He has occasional headaches but has been complaining of them more recently, with pain over the temples. He has nausea/vomiting with chemotherapy. He has occasional sensation of food getting stuck in his throat. He has chronic back pain. No bowel/bladder dysfunction. Memory is okay. He denies any alcohol use. He has sleep difficulties, melatonin does not help. He had a normal birth and early development.  There is no history of febrile convulsions, CNS infections such as meningitis/encephalitis, significant traumatic brain injury, neurosurgical procedures, or family history of seizures.    PAST MEDICAL HISTORY: Past Medical History:  Diagnosis Date  . Allergic rhinitis   . Anxiety   . Arthritis   . Asthma   . At risk for sleep apnea  STOP-BANG= 6       SENT TO PCP 01-22-2015  . Chronic back pain    "from the neck to the lower back"   . 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 Depression   . Dysthymic disorder   . ED (erectile dysfunction)   . GERD (gastroesophageal reflux disease)   . History of  acute pyelonephritis    01-07-2015  . History of Graves' disease    vs  Multinodular  . History of hiatal hernia   . History of kidney stones   . History of non-ST elevation myocardial infarction (NSTEMI)    Jan 2014--  no CAD;  per notes probable coronary vasospasm  . History of panic attacks   . History of septic shock    01-07-2015--  ureterolithias/ pyelonephritis  . History of thyroid storm    Nov 2011  . Hyperlipidemia   . Hypertension   . Hypothyroidism following radioiodine therapy    RAI in Mar 2012---  followed by dr Loanne Drilling  . PAD (peripheral artery disease) [facility]) cardiologist-  dr Fletcher Anon   a.  ABI (01/2014):  L 0.53; R 1.0 >> referred to [facility]  . Right ureteral stone   . Septic shock ([place]) 01/05/2015  . Sleep disturbance   . Thrombocytopenia ([place])     PAST SURGICAL HISTORY: Past Surgical History:  Procedure Laterality Date  . ABDOMINAL AORTAGRAM N/A 02/19/2014   Procedure: ABDOMINAL Maxcine Ham;  Surgeon: Wellington Hampshire, MD;  Location: [place] CATH LAB;  Service: Cardiovascular;  Laterality: N/A;  . ANTERIOR CERVICAL DECOMP/DISCECTOMY FUSION  09-08-2010   C3 -4  . BACK SURGERY     x5, LOWER X 3, UPPER NECK X 2  . CYSTOSCOPY W/ URETERAL STENT PLACEMENT Right 01/04/2015   Procedure: CYSTOSCOPY WITH RETROGRADE PYELOGRAM/URETERAL STENT PLACEMENT;  Surgeon: Franchot Gallo, MD;  Location: [facility];  Service: Urology;  Laterality: Right;  . CYSTOSCOPY W/ URETERAL STENT REMOVAL Right 01/26/2015   Procedure: CYSTOSCOPY WITH STENT REMOVAL;  Surgeon: Franchot Gallo, MD;  [facility];  Service: Urology;  Laterality: Right;  . CYSTOSCOPY/URETEROSCOPY/HOLMIUM LASER/STENT PLACEMENT Right 01/26/2015   Procedure: CYSTOSCOPY/URETEROSCOPY RIGHT;  Surgeon: Franchot Gallo, MD;  Location: [facility];  Service: Urology;  Laterality: Right;  . FEMORAL-POPLITEAL BYPASS GRAFT Left 09/11/2015   Procedure: BYPASS GRAFT LEFT ABOVE KNEE TO BELOW KNEE POPLITEAL  ARTERY WITH LEFT GREATER SAPHENOUS VEIN;  Surgeon: Serafina Mitchell, MD;  Location: [place];  Service: Vascular;  Laterality: Left;  . INCISIONAL HERNIA REPAIR N/A 01/19/2016   Procedure: HERNIA REPAIR INCISIONAL;  Surgeon: Leighton Ruff, MD;  Location: [facility];  Service: General;  Laterality: N/A;  . LAMINECTOMY AND MICRODISCECTOMY LUMBAR SPINE  03-26-2009   left L5 -- S1  . LAPAROSCOPIC SIGMOID COLECTOMY N/A 01/14/2016   Procedure: LAPAROSCOPIC SIGMOID COLECTOMY;  Surgeon: Leighton Ruff, MD;  Location: [facility];  Service: General;  Laterality: N/A;  . LEFT HEART CATHETERIZATION WITH CORONARY ANGIOGRAM N/A 06/12/2012   Procedure: LEFT HEART CATHETERIZATION WITH CORONARY ANGIOGRAM;  Surgeon: Josue Hector, MD;  Location: [facility] CATH LAB;  Service: Cardiovascular;  Laterality: N/A;   No sig. CAD/  normal LVF, ef 55-65%  . LIVER BIOPSY N/A 01/14/2016   Procedure: LIVER BIOPSY;  Surgeon: Leighton Ruff, MD;  [facility]: [facility];  Service: General;  Laterality: N/A;  . LOWER EXTREMITY ANGIOGRAM Left 05/28/2014   Procedure:  LOWER EXTREMITY ANGIOGRAM;  Surgeon: Wellington Hampshire, MD;  Location: [place] CATH LAB;  Service: Cardiovascular;  Laterality: Left;  Failed PTA CTO  . POPLITEAL ARTERY ANGIOPLASTY Left 05/28/2014    dr Fletcher Anon   Attempted and unsuccessful due to inability to cross the occlusionnotes   . PORTACATH PLACEMENT Right 01/14/2016   Procedure: INSERTION PORT-A-CATH;  Surgeon: Leighton Ruff, MD;  Location: [facility];  Service: General;  Laterality: Right;  . POSTERIOR CERVICAL FUSION/FORAMINOTOMY N/A 12/10/2012   Procedure: POSTERIOR CERVICAL FUSION/FORAMINOTOMY LEVEL 1;  Surgeon: Ophelia Charter, MD;  Location: [facility] NEURO ORS;  Service: Neurosurgery;  Laterality: N/A;  Cervical three-four posterior cervical fusion with lateral mass screws  . POSTERIOR LUMBAR FUSION  11-11-2009;   07-15-2013   L5 -- S1;   L4-- S1  . SHOULDER ARTHROSCOPY Right 03-08-2004   debridement labral tear/  DCR/  acromioplasty  . TRANSTHORACIC  ECHOCARDIOGRAM  01-05-2015   mild LVH/  ef 60-65%/  mild TR  . UMBILICAL HERNIA REPAIR  1980  . VEIN HARVEST Left 09/11/2015   Procedure: LEFT GREATER SAPHENOUS VEIN HARVEST;  Surgeon: Serafina Mitchell, MD;  Location: [facility];  Service: Vascular;  Laterality: Left;    MEDICATIONS: Current Outpatient Medications on File Prior to Visit  Medication Sig Dispense Refill  . ACCU-CHEK SOFTCLIX LANCETS lancets USE  TID TO CHECK BLOOD SUGAR LEVELS  0  . amLODipine (NORVASC) 5 MG tablet TAKE 1 TABLET(5 MG) BY MOUTH DAILY (Patient taking differently: Take 5 mg by mouth daily. ) 90 tablet 3  . aspirin EC 81 MG tablet Take 81 mg by mouth daily.    Marland Kitchen atorvastatin (LIPITOR) 80 MG tablet TAKE 1 TABLET(80 MG) BY MOUTH EVERY EVENING (Patient taking differently: Take 80 mg by mouth daily. ) 90 tablet 3  . dapagliflozin propanediol (FARXIGA) 5 MG TABS tablet Take 5 mg by mouth daily.    Marland Kitchen docusate sodium (COLACE) 100 MG capsule Take 100 mg by mouth 2 (two) times daily.    . insulin aspart (NOVOLOG) 100 UNIT/ML injection Inject 12 Units into the skin 3 (three) times daily before meals. 12 mL 0  . Insulin Degludec (TRESIBA FLEXTOUCH [place]) Inject 80 Units into the skin daily.    Marland Kitchen levETIRAcetam (KEPPRA) 500 MG tablet Take 1 tablet (500 mg total) by mouth 2 (two) times daily. 60 tablet 2  . levothyroxine (SYNTHROID) 150 MCG tablet Take 1 tablet by mouth once daily. **Must be seen in office for future refills** 30 tablet 0  . lidocaine-prilocaine (EMLA) cream Apply 1 application topically as needed. Apply to portacath site 1 hour prior to use 30 g 3  . LORazepam (ATIVAN) 0.5 MG tablet Take 1 tablet (0.5 mg total) by mouth every 8 (eight) hours as needed for anxiety or sleep (nausea). 30 tablet 0  . meclizine (ANTIVERT) 25 MG tablet Take 25 mg by mouth 3 (three) times daily as needed.  1  . metFORMIN (GLUCOPHAGE-XR) 500 MG 24 hr tablet Take 1,000 mg by mouth in the morning and at bedtime.   3  . metoprolol tartrate  (LOPRESSOR) 50 MG tablet TAKE 1/2 TABLET BY MOUTH TWICE DAILY 90 tablet 1  . nitroGLYCERIN (NITROSTAT) 0.4 MG SL tablet PLACE 1 TABLET UNDER THE TONGUE EVERY 5 MINUTES AS NEEDED FOR CHEST PAIN. (Patient taking differently: Place 0.4 mg under the tongue every 5 (five) minutes as needed for chest pain. ) 25 tablet 0  . nystatin (MYCOSTATIN) 100000 UNIT/ML suspension Take 5 mLs (500,000 Units  total) by mouth 4 (four) times daily. (Patient taking differently: Take 500,000 Units by mouth 4 (four) times daily as needed. ) 60 mL 0  . oxyCODONE-acetaminophen (PERCOCET) 10-325 MG tablet Take 1 tablet by mouth every 6 (six) hours as needed for pain (for back pain). 30 tablet 0  . pantoprazole (PROTONIX) 40 MG tablet Take 40 mg by mouth daily.   5  . promethazine (PHENERGAN) 12.5 MG tablet Take 1 tablet (12.5 mg total) by mouth every 6 (six) hours as needed. (Patient taking differently: Take 12.5 mg by mouth every 6 (six) hours as needed for nausea or vomiting. ) 30 tablet 2  . tamsulosin (FLOMAX) 0.4 MG CAPS capsule Take 0.4 mg by mouth every evening.     . venlafaxine XR (EFFEXOR-XR) 75 MG 24 hr capsule Take 225 mg by mouth daily.     Current Facility-Administered Medications on File Prior to Visit  Medication Dose Route Frequency Provider Last Rate Last Admin  . alteplase (CATHFLO ACTIVASE) injection 2 mg  2 mg Intracatheter Once PRN Betsy Coder B, MD      . sodium chloride flush (NS) 0.9 % injection 10 mL  10 mL Intravenous PRN Ladell Pier, MD   10 mL at 02/04/16 1431  . sodium chloride flush (NS) 0.9 % injection 10 mL  10 mL Intravenous PRN Ladell Pier, MD   10 mL at 10/18/16 2025  . sodium chloride flush (NS) 0.9 % injection 10 mL  10 mL Intravenous PRN Ladell Pier, MD   10 mL at 05/03/17 0855    ALLERGIES: Allergies  Allergen Reactions  . Hydrocodone Hives    FAMILY HISTORY: Family History  Problem Relation Age of Onset  . Thyroid disease Mother        hypothyroidism  .  Heart attack Maternal Grandfather   . Heart Problems Father        pacermaker  . Edema Father   . Heart disease Maternal Grandmother   . Lung cancer Maternal Grandmother   . Diabetes Maternal Grandmother   . Hypertension Maternal Grandmother     SOCIAL HISTORY: Social History   Socioeconomic History  . Marital status: Married    Spouse name: Not on file  . Number of children: 5  . Years of education: Not on file  . Highest education level: Not on file  Occupational History  . Occupation: Disabled    Employer: UNEMPLOYED  Tobacco Use  . Smoking status: Never Smoker  . Smokeless tobacco: Never Used  Vaping Use  . Vaping Use: Never used  Substance and Sexual Activity  . Alcohol use: Yes    Alcohol/week: 0.0 standard drinks    Comment: RARE  . Drug use: No  . Sexual activity: Not on file  Other Topics Concern  . Not on file  Social History Narrative   Married - wife works Surveyor, quantity unit @ [employer]   5 daughters   Regular exercise-no   Disabled to due back problem   12/04/2015   Right handed    Social Determinants of Health   Financial Resource Strain:   . Difficulty of Paying Living Expenses:   Food Insecurity:   . Worried About Charity fundraiser in the Last Year:   . Arboriculturist in the Last Year:   Transportation Needs:   . Film/video editor (Medical):   Marland Kitchen Lack of Transportation (Non-Medical):   Physical Activity:   . Days of Exercise per Week:   .  Minutes of Exercise per Session:   Stress:   . Feeling of Stress :   Social Connections:   . Frequency of Communication with Friends and Family:   . Frequency of Social Gatherings with Friends and Family:   . Attends Religious Services:   . Active Member of Clubs or Organizations:   . Attends Archivist Meetings:   Marland Kitchen Marital Status:   Intimate Partner Violence:   . Fear of Current or Ex-Partner:   . Emotionally Abused:   Marland Kitchen Physically Abused:   . Sexually Abused:     PHYSICAL  EXAM: Vitals:   11/14/19 1015  BP: (!) 149/110  Pulse: (!) 118  SpO2: 100%   General: No acute distress Head:  Normocephalic/atraumatic Skin/Extremities: No rash, no edema Neurological Exam: Mental status: alert and oriented to person, place, and time, no dysarthria or aphasia, Fund of knowledge is appropriate.  Recent and remote memory are intact. 3/3 delayed recall.  Attention and concentration are normal.    Able to name objects and repeat phrases. Cranial nerves: CN I: not tested CN II: pupils equal, round and reactive to light, visual fields intact CN III, IV, VI:  full range of motion, no nystagmus, no ptosis CN V: facial sensation intact CN VII: upper and lower face symmetric CN VIII: hearing intact to conversation Bulk & Tone: normal, no fasciculations. Motor: 5/5 throughout with no pronator drift, pain on shoulder abduction bilaterally Sensation: intact to light touch, cold, pin, vibration and joint position sense.  No extinction to double simultaneous stimulation.  Romberg test negative Deep Tendon Reflexes: +1 throughout Cerebellar: no incoordination on finger to nose testing Gait: narrow-based and steady, mild difficulty with tandem walk Tremor: none   IMPRESSION: This is a 54 year old right-handed man with a history of hypertension, diabetes, colon cancer on chemotherapy, Graves disease s/p radioactive iodine, PVD, s/p bypass graft, chronic thrombocytopenia, presenting for evaluation of new onset seizure last 10/25/2019. This was preceded by confusion. His glucose levels were very elevated on admission. His EEG was abnormal with frequent spikes over the right frontocentral region, maximal at F4. MRI brain unremarkable. Etiology unknown, focal seizures can be induced by hyperglycemia, and seizure control is associated with resolution of hyperglycemia. At this point would continue Levetiracetam 500mg  BID. He may not need AED long-term. He has had bilateral shoulder pain and  headaches since the seizures, shoulder xrays will be ordered, he may take prn Ibuprofen and knows to minimize intake to 2-3 a week to avoid rebound headaches. Continue control of BP and glucose.   driving laws were discussed with the patient, and he knows to stop driving after a seizure, until 6 months seizure-free. He will follow-up in 3-4 months and knows to call for any changes.   Thank you for allowing me to participate in the care of this patient. Please do not hesitate to call for any questions or concerns.   Ellouise Newer, M.D.  CC: Dr. Cruzita Lederer, Lucita Lora, NP

## 2019-11-14 NOTE — Patient Instructions (Signed)
Good to meet you!  1. Continue Keppra 500mg  twice a day  2. Bilateral shoulder xrays will be ordered  3. Try as needed ibuprofen for shoulder pain and headaches. Do not take more than 2-3 times a week, taking over the counter pain medication daily can worsen headaches  4. Continue to monitor BP and glucose levels  5. Follow-up in 3-4 months, call for any changes  Seizure Precautions: 1. If medication has been prescribed for you to prevent seizures, take it exactly as directed.  Do not stop taking the medicine without talking to your doctor first, even if you have not had a seizure in a long time.   2. Avoid activities in which a seizure would cause danger to yourself or to others.  Don't operate dangerous machinery, swim alone, or climb in high or dangerous places, such as on ladders, roofs, or girders.  Do not drive unless your doctor says you may.  3. If you have any warning that you may have a seizure, lay down in a safe place where you can't hurt yourself.    4.  No driving for 6 months from last seizure, as per [facility].   Please refer to the following link on the [facility] website for more information: http://[email or web address]/[email or web address]/driving/drivingu.cfm   5.  Maintain good sleep hygiene. Avoid alcohol.  6.  Contact your doctor if you have any problems that may be related to the medicine you are taking.  7.  Call 911 and bring the patient back to the ED if:        A.  The seizure lasts longer than 5 minutes.       B.  The patient doesn't awaken shortly after the seizure  C.  The patient has new problems such as difficulty seeing, speaking or moving  D.  The patient was injured during the seizure  E.  The patient has a temperature over 102 F (39C)  F.  The patient vomited and now is having trouble breathing

## 2019-11-15 ENCOUNTER — Telehealth: Payer: Self-pay

## 2019-11-15 ENCOUNTER — Telehealth: Payer: Self-pay | Admitting: Neurology

## 2019-11-15 ENCOUNTER — Other Ambulatory Visit: Payer: Self-pay

## 2019-11-15 DIAGNOSIS — M25512 Pain in left shoulder: Secondary | ICD-10-CM

## 2019-11-15 DIAGNOSIS — M25511 Pain in right shoulder: Secondary | ICD-10-CM

## 2019-11-15 NOTE — Telephone Encounter (Signed)
Pt called to go over xray no answer voice mail left for pt to call back

## 2019-11-15 NOTE — Telephone Encounter (Signed)
Spoke to pt wife went over x-ray results they would like to do PT for his shoulders, orders placed in Epic no questions asked at this time ,

## 2019-11-15 NOTE — Telephone Encounter (Signed)
-----   Message from Cameron Sprang, MD sent at 11/15/2019  7:40 AM EDT ----- Pls let him know that the shoulder xrays did not show any fracture or dislocation. Would recommend physical therapy, and if no improvement, see Ortho. Thanks

## 2019-11-18 ENCOUNTER — Other Ambulatory Visit: Payer: Self-pay | Admitting: Oncology

## 2019-11-21 ENCOUNTER — Inpatient Hospital Stay: Payer: Medicaid Other

## 2019-11-21 ENCOUNTER — Other Ambulatory Visit: Payer: Self-pay

## 2019-11-21 ENCOUNTER — Inpatient Hospital Stay (HOSPITAL_BASED_OUTPATIENT_CLINIC_OR_DEPARTMENT_OTHER): Payer: Medicaid Other | Admitting: Oncology

## 2019-11-21 ENCOUNTER — Other Ambulatory Visit: Payer: Self-pay | Admitting: *Deleted

## 2019-11-21 ENCOUNTER — Inpatient Hospital Stay: Payer: Medicaid Other | Attending: Oncology

## 2019-11-21 ENCOUNTER — Telehealth: Payer: Self-pay | Admitting: Oncology

## 2019-11-21 VITALS — BP 154/100 | HR 89

## 2019-11-21 VITALS — BP 142/90 | HR 90 | Temp 98.2°F | Resp 16 | Ht 70.0 in | Wt 229.4 lb

## 2019-11-21 DIAGNOSIS — C7800 Secondary malignant neoplasm of unspecified lung: Secondary | ICD-10-CM | POA: Diagnosis not present

## 2019-11-21 DIAGNOSIS — E1165 Type 2 diabetes mellitus with hyperglycemia: Secondary | ICD-10-CM | POA: Diagnosis not present

## 2019-11-21 DIAGNOSIS — C787 Secondary malignant neoplasm of liver and intrahepatic bile duct: Secondary | ICD-10-CM

## 2019-11-21 DIAGNOSIS — C187 Malignant neoplasm of sigmoid colon: Secondary | ICD-10-CM | POA: Diagnosis present

## 2019-11-21 DIAGNOSIS — Z5111 Encounter for antineoplastic chemotherapy: Secondary | ICD-10-CM | POA: Insufficient documentation

## 2019-11-21 DIAGNOSIS — R110 Nausea: Secondary | ICD-10-CM | POA: Diagnosis not present

## 2019-11-21 DIAGNOSIS — Z5112 Encounter for antineoplastic immunotherapy: Secondary | ICD-10-CM | POA: Diagnosis not present

## 2019-11-21 DIAGNOSIS — C189 Malignant neoplasm of colon, unspecified: Secondary | ICD-10-CM

## 2019-11-21 DIAGNOSIS — Z794 Long term (current) use of insulin: Secondary | ICD-10-CM | POA: Diagnosis not present

## 2019-11-21 LAB — CMP (CANCER CENTER ONLY)
ALT: 78 U/L — ABNORMAL HIGH (ref 0–44)
AST: 55 U/L — ABNORMAL HIGH (ref 15–41)
Albumin: 3.8 g/dL (ref 3.5–5.0)
Alkaline Phosphatase: 113 U/L (ref 38–126)
Anion gap: 16 — ABNORMAL HIGH (ref 5–15)
BUN: 9 mg/dL (ref 6–20)
CO2: 21 mmol/L — ABNORMAL LOW (ref 22–32)
Calcium: 8.8 mg/dL — ABNORMAL LOW (ref 8.9–10.3)
Chloride: 99 mmol/L (ref 98–111)
Creatinine: 1.34 mg/dL — ABNORMAL HIGH (ref 0.61–1.24)
GFR, Est AFR Am: >60 mL/min (ref >60)
GFR, Estimated: >60 mL/min (ref >60)
Glucose, Bld: 398 mg/dL — ABNORMAL HIGH (ref 70–99)
Potassium: 4.1 mmol/L (ref 3.5–5.1)
Sodium: 136 mmol/L (ref 135–145)
Total Bilirubin: 0.5 mg/dL (ref 0.3–1.2)
Total Protein: 8 g/dL (ref 6.5–8.1)

## 2019-11-21 LAB — CBC WITH DIFFERENTIAL (CANCER CENTER ONLY)
Abs Immature Granulocytes: 0.01 10*3/uL (ref 0.00–0.07)
Basophils Absolute: 0 10*3/uL (ref 0.0–0.1)
Basophils Relative: 0 %
Eosinophils Absolute: 0.1 10*3/uL (ref 0.0–0.5)
Eosinophils Relative: 2 %
HCT: 42.5 % (ref 39.0–52.0)
Hemoglobin: 13.6 g/dL (ref 13.0–17.0)
Immature Granulocytes: 0 %
Lymphocytes Relative: 39 %
Lymphs Abs: 2.5 10*3/uL (ref 0.7–4.0)
MCH: 28.6 pg (ref 26.0–34.0)
MCHC: 32 g/dL (ref 30.0–36.0)
MCV: 89.3 fL (ref 80.0–100.0)
Monocytes Absolute: 0.6 10*3/uL (ref 0.1–1.0)
Monocytes Relative: 9 %
Neutro Abs: 3.2 10*3/uL (ref 1.7–7.7)
Neutrophils Relative %: 50 %
Platelet Count: 138 10*3/uL — ABNORMAL LOW (ref 150–400)
RBC: 4.76 MIL/uL (ref 4.22–5.81)
RDW: 15.1 % (ref 11.5–15.5)
WBC Count: 6.5 10*3/uL (ref 4.0–10.5)
nRBC: 0 % (ref 0.0–0.2)

## 2019-11-21 LAB — CEA (IN HOUSE-CHCC): CEA (CHCC-In House): 10.71 ng/mL — ABNORMAL HIGH (ref 0.00–5.00)

## 2019-11-21 LAB — GLUCOSE, CAPILLARY: Glucose-Capillary: 232 mg/dL — ABNORMAL HIGH (ref 70–99)

## 2019-11-21 MED ORDER — SODIUM CHLORIDE 0.9 % IV SOLN
Freq: Once | INTRAVENOUS | Status: AC
  Filled 2019-11-21: qty 250

## 2019-11-21 MED ORDER — HEPARIN SOD (PORK) LOCK FLUSH 100 UNIT/ML IV SOLN
500.0000 [IU] | Freq: Once | INTRAVENOUS | Status: DC | PRN
  Filled 2019-11-21: qty 5

## 2019-11-21 MED ORDER — DEXAMETHASONE SODIUM PHOSPHATE 10 MG/ML IJ SOLN
5.0000 mg | Freq: Once | INTRAMUSCULAR | Status: AC
  Administered 2019-11-21: 5 mg via INTRAVENOUS

## 2019-11-21 MED ORDER — SODIUM CHLORIDE 0.9 % IV SOLN
150.0000 mg | Freq: Once | INTRAVENOUS | Status: AC
  Administered 2019-11-21: 150 mg via INTRAVENOUS
  Filled 2019-11-21: qty 150

## 2019-11-21 MED ORDER — ONDANSETRON HCL 8 MG PO TABS
8.0000 mg | ORAL_TABLET | Freq: Three times a day (TID) | ORAL | 1 refills | Status: DC | PRN
Start: 2019-11-21 — End: 2020-04-02

## 2019-11-21 MED ORDER — ATROPINE SULFATE 1 MG/ML IJ SOLN
INTRAMUSCULAR | Status: AC
  Filled 2019-11-21: qty 1

## 2019-11-21 MED ORDER — SODIUM CHLORIDE 0.9 % IV SOLN
400.0000 mg/m2 | Freq: Once | INTRAVENOUS | Status: AC
  Administered 2019-11-21: 912 mg via INTRAVENOUS
  Filled 2019-11-21: qty 45.6

## 2019-11-21 MED ORDER — OLANZAPINE 5 MG PO TABS: 5.0000 mg | ORAL_TABLET | Freq: Every day | ORAL | 1 refills | Status: DC

## 2019-11-21 MED ORDER — DEXAMETHASONE SODIUM PHOSPHATE 10 MG/ML IJ SOLN
INTRAMUSCULAR | Status: AC
  Filled 2019-11-21: qty 1

## 2019-11-21 MED ORDER — ATROPINE SULFATE 1 MG/ML IJ SOLN
0.5000 mg | Freq: Once | INTRAMUSCULAR | Status: AC | PRN
  Administered 2019-11-21: 0.5 mg via INTRAVENOUS

## 2019-11-21 MED ORDER — PALONOSETRON HCL INJECTION 0.25 MG/5ML
INTRAVENOUS | Status: AC
  Filled 2019-11-21: qty 5

## 2019-11-21 MED ORDER — FLUOROURACIL CHEMO INJECTION 2.5 GM/50ML
400.0000 mg/m2 | Freq: Once | INTRAVENOUS | Status: AC
  Administered 2019-11-21: 900 mg via INTRAVENOUS
  Filled 2019-11-21: qty 18

## 2019-11-21 MED ORDER — PALONOSETRON HCL INJECTION 0.25 MG/5ML
0.2500 mg | Freq: Once | INTRAVENOUS | Status: AC
  Administered 2019-11-21: 0.25 mg via INTRAVENOUS

## 2019-11-21 MED ORDER — SODIUM CHLORIDE 0.9 % IV SOLN
5.0000 mg/kg | Freq: Once | INTRAVENOUS | Status: AC
  Administered 2019-11-21: 500 mg via INTRAVENOUS
  Filled 2019-11-21: qty 16

## 2019-11-21 MED ORDER — ATROPINE SULFATE 0.4 MG/ML IJ SOLN
INTRAMUSCULAR | Status: AC
  Filled 2019-11-21: qty 1

## 2019-11-21 MED ORDER — SODIUM CHLORIDE 0.9 % IV SOLN
2000.0000 mg/m2 | INTRAVENOUS | Status: DC
  Administered 2019-11-21: 4550 mg via INTRAVENOUS
  Filled 2019-11-21: qty 91

## 2019-11-21 MED ORDER — SODIUM CHLORIDE 0.9% FLUSH
10.0000 mL | INTRAVENOUS | Status: DC | PRN
  Filled 2019-11-21: qty 10

## 2019-11-21 MED ORDER — SODIUM CHLORIDE 0.9 % IV SOLN
180.0000 mg/m2 | Freq: Once | INTRAVENOUS | Status: AC
  Administered 2019-11-21: 420 mg via INTRAVENOUS
  Filled 2019-11-21: qty 15

## 2019-11-21 NOTE — Telephone Encounter (Signed)
Scheduled per 07/08 los, patient will be notified per My chart.

## 2019-11-21 NOTE — Progress Notes (Signed)
Per Dr. Benay Spice: OK to treat w/BP 142/90 today. OK to tx with elevated glucose of 398 (patient has not taken his am insulin). Wife bringing his insulin in to him to self-administer in infusion room. Check CBG 2 hours after insulin given.

## 2019-11-21 NOTE — Patient Instructions (Signed)
[facility] Discharge Instructions for Patients Receiving Chemotherapy  Today you received the following chemotherapy agents:  Bevacizamab, Irinotecan, Leucovorin, Fluorouracil, CADD legacy plus pump  To help prevent nausea and vomiting after your treatment, we encourage you to take your nausea medication as prescribed & as needed. If you develop nausea and vomiting that is not controlled by your nausea medication, call the clinic.   BELOW ARE SYMPTOMS THAT SHOULD BE REPORTED IMMEDIATELY:  *FEVER GREATER THAN 100.5 F  *CHILLS WITH OR WITHOUT FEVER  NAUSEA AND VOMITING THAT IS NOT CONTROLLED WITH YOUR NAUSEA MEDICATION  *UNUSUAL SHORTNESS OF BREATH  *UNUSUAL BRUISING OR BLEEDING  TENDERNESS IN MOUTH AND THROAT WITH OR WITHOUT PRESENCE OF ULCERS  *URINARY PROBLEMS  *BOWEL PROBLEMS  UNUSUAL RASH Items with * indicate a potential emergency and should be followed up as soon as possible.  Feel free to call the clinic should you have any questions or concerns. The clinic phone number is [phone].  Please show the [place] at check-in to the Emergency Department and triage nurse.

## 2019-11-21 NOTE — Progress Notes (Signed)
Pharmacy review notes that OK to take Zyprexa x 3 days after chemo in regards to his H/O a seizure and he is on Keppra.

## 2019-11-21 NOTE — Progress Notes (Signed)
[facility] OFFICE PROGRESS NOTE   Diagnosis: Colon cancer  INTERVAL HISTORY:   [name] completed another cycle of FOLFIRI/Avastin on 11/07/2019.  He reports nausea and vomiting several times beginning 3 to 4 days following chemotherapy.  No diarrhea.  No mouth sores.  No hand or foot pain.  He reports his blood sugar is under better control with the new insulin regimen.  No recurrent seizure.  Objective:  Vital signs in last 24 hours:  Blood pressure (!) 142/90, pulse 90, temperature 98.2 F (36.8 C), temperature source Temporal, resp. rate 16, height '5\' 10"'$  (1.778 m), weight 229 lb 6.4 oz (104.1 kg), SpO2 98 %.    HEENT: No thrush or ulcers  Resp: Lungs clear bilaterally Cardio: Regular rate and rhythm GI: No hepatomegaly, nontender Vascular: No leg edema  Skin: Palms without erythema  Portacath/PICC-without erythema  Lab Results:  Lab Results  Component Value Date   WBC 6.5 11/21/2019   HGB 13.6 11/21/2019   HCT 42.5 11/21/2019   MCV 89.3 11/21/2019   PLT 138 (L) 11/21/2019   NEUTROABS 3.2 11/21/2019    CMP  Lab Results  Component Value Date   NA 136 11/21/2019   K 4.1 11/21/2019   CL 99 11/21/2019   CO2 21 (L) 11/21/2019   GLUCOSE 398 (H) 11/21/2019   BUN 9 11/21/2019   CREATININE 1.34 (H) 11/21/2019   CALCIUM 8.8 (L) 11/21/2019   PROT 8.0 11/21/2019   ALBUMIN 3.8 11/21/2019   AST 55 (H) 11/21/2019   ALT 78 (H) 11/21/2019   ALKPHOS 113 11/21/2019   BILITOT 0.5 11/21/2019   GFRNONAA >60 11/21/2019   GFRAA >60 11/21/2019    Lab Results  Component Value Date   CEA1 13.47 (H) 11/07/2019     Medications: I have reviewed the patient's current medications.   Assessment/Plan: 1. 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 CTschest, abdomen, and pelvis on 12/11/2015-indeterminate tiny pulmonary nodules, multiple liver metastases, small nodes  in the sigmoid mesocolon  3. Laparoscopic sigmoid colectomy, liver biopsy, Port-A-Cath placement 01/14/2016  4. Pathology sigmoid colon resection-colonic adenocarcinoma, 5.3 cm extending into pericolonic connective tissue, positive lymph-vascular invasion, positive perineural invasion, negative margins, metastatic carcinoma in9 of 28 lymph nodes  5. Pathology liver biopsy-metastatic colorectal adenocarcinoma  6. MSI stable; mismatch repair protein normal  7. 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 Cycle 1 FOLFOX 02/02/2016  9. Cycle 2 FOLFOX 02/15/2016  10. Cycle 3 FOLFOX 02/29/2016  11. Cycle 4 FOLFOX 03/14/2016  12. Cycle 5 FOLFOX 03/28/2016  13. Cycle 6 FOLFOX 04/11/2016 (oxaliplatin held secondary to thrombocytopenia)  14. 04/21/2016 restaging CTs-difficulty evaluating liver lesions due to hepatic steatosis. Stable right adrenal nodule. No adenopathy or local recurrence near the rectosigmoid anastomotic site.  15. Cycle 7 FOLFOX 04/25/2016  16. MRI liver 05/02/2016-partial improvement in hepatic metastases  17. Cycle 8 FOLFOX 05/10/2016  18. Cycle 9 FOLFOX 05/23/2016 (oxaliplatin held due to thrombocytopenia)  19. Cycle 10 FOLFOX 06/06/2016 (oxaliplatin dose reduced due to thrombocytopenia)  20. Cycle 11 FOLFOX 06/27/2016 (oxaliplatin held due to neuropathy)  21. Cycle 12 FOLFOX 07/11/2016 (oxaliplatin held) 22. Initiation of maintenance Xeloda 7 days on/7 days off 07/27/2016 23. MRI liver 11/18/2016-decrease in hepatic metastatic disease. No new or progressive disease identified within the abdomen. 24. Continuation of Xeloda 7 days on/7 days off 25. MRI liver 04/27/2017-previous liver lesions not identified, no new lesions, no lymphadenopathy 26. Xeloda  continued 7 days on/7 days off 27. MRI liver 12/04/2017 -no evidence of metastatic disease, hepatic steatosis 28. Xeloda continued 7 days on/7 days off 29. MRI liver 07/15/2018-no evidence of metastatic disease. Stable  severe hepatic steatosis. 30. Xeloda continued 7 days on/7 days off 31. MRI liver 03/16/2019-hepatic steatosis, no liver mass, focal area of intrahepatic biliary dilatation in segments 2 and 3 of the left lobe-increased 32. Xeloda continued 7 days on/7 days off 33. MRI abdomen 08/19/2019-no findings to suggest liver metastases. Bilateral lung nodules measuring up to 2.8 cm, progressive and more conspicuous than on previous exam 34. CT chest 08/29/2019-multiple pulmonary metastases, new from 04/21/2016 35. Cycle 1 FOLFIRI/bevacizumab 09/09/2019 36. Cycle 2 FOLFIRI/bevacizumab 09/26/2019 37. Cycle 3 FOLFIRI/bevacizumab 10/10/2019 38. Cycle 4 FOLFIRI/bevacizumab 10/24/2019 39. Cycle 5 FOLFIRI/bevacizumab 11/07/2019 40. CT chest 11/14/2019-decreased size of lung nodules, no new lesions, hepatic steatosis 41. Cycle 6 FOLFIRI/bevacizumab 11/21/2019  2. Rectal bleeding and constipation secondary to #1  3. History of peripheral vascular disease, status post left lower extremity vascular bypass surgery in April 2017  4. History of nephrolithiasis  5. History of Graves' disease treated with radioactive iodine  6. Anxiety/depression  7. Hypertension  8. Hospitalization 01/19/2016 with wound dehiscence status post secondary suture closure of abdominal wall  9. 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 Hyperglycemia 06/20/2016-diagnosed with diabetes, maintained on insulin  11.Positive COVID test 12/13/2018  12. Hospitalized with seizure activity/DKA. Now on Keppra, insulin adjusted. 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     Disposition: [name].  He reports delayed nausea following chemotherapy.  We are unable to add Decadron in his case due  to poorly controlled diabetes.  We will prescribe Zofran and Zyprexa with this cycle.  He will call for nausea following this cycle of chemotherapy.  [name] has not taken his insulin today and the blood sugar is markedly elevated.  His wife will bring his insulin to take at the cancer center.  We will repeat a blood sugar check later today.  I reviewed the CT images with [name] and his wife.  The lung nodules are smaller.  The plan is to continue FOLFIRI/Avastin.  He will be scheduled for a restaging CT after 4 additional cycles of chemotherapy.  The plan is to switch to a Panitumumab based regimen when there is disease progression.  Jeff [name] will return for an office visit and chemotherapy in 2 weeks. 11/21/2019  10:27 AM

## 2019-11-22 ENCOUNTER — Telehealth: Payer: Self-pay | Admitting: *Deleted

## 2019-11-22 NOTE — Telephone Encounter (Signed)
Left message to start zyprex on 11/23/19 in the evening. Take at night due to medication causes sedation.

## 2019-11-23 ENCOUNTER — Other Ambulatory Visit: Payer: Self-pay

## 2019-11-23 ENCOUNTER — Inpatient Hospital Stay: Payer: Medicaid Other

## 2019-11-23 VITALS — BP 127/89 | HR 92 | Temp 97.5°F | Resp 20

## 2019-11-23 DIAGNOSIS — C187 Malignant neoplasm of sigmoid colon: Secondary | ICD-10-CM

## 2019-11-23 DIAGNOSIS — Z5112 Encounter for antineoplastic immunotherapy: Secondary | ICD-10-CM | POA: Diagnosis not present

## 2019-11-23 MED ORDER — SODIUM CHLORIDE 0.9% FLUSH
10.0000 mL | INTRAVENOUS | Status: DC | PRN
  Administered 2019-11-23: 10 mL
  Filled 2019-11-23: qty 10

## 2019-11-23 MED ORDER — HEPARIN SOD (PORK) LOCK FLUSH 100 UNIT/ML IV SOLN
500.0000 [IU] | Freq: Once | INTRAVENOUS | Status: AC | PRN
  Administered 2019-11-23: 500 [IU]
  Filled 2019-11-23: qty 5

## 2019-11-23 NOTE — Patient Instructions (Signed)

## 2019-11-27 DIAGNOSIS — Z6825 Body mass index (BMI) 25.0-25.9, adult: Secondary | ICD-10-CM | POA: Insufficient documentation

## 2019-12-03 ENCOUNTER — Other Ambulatory Visit: Payer: Self-pay | Admitting: Oncology

## 2019-12-05 ENCOUNTER — Inpatient Hospital Stay (HOSPITAL_BASED_OUTPATIENT_CLINIC_OR_DEPARTMENT_OTHER): Payer: Medicaid Other | Admitting: Nurse Practitioner

## 2019-12-05 ENCOUNTER — Inpatient Hospital Stay: Payer: Medicaid Other

## 2019-12-05 ENCOUNTER — Encounter: Payer: Self-pay | Admitting: Nurse Practitioner

## 2019-12-05 ENCOUNTER — Telehealth: Payer: Self-pay | Admitting: Oncology

## 2019-12-05 ENCOUNTER — Other Ambulatory Visit: Payer: Self-pay

## 2019-12-05 VITALS — BP 120/89 | HR 100 | Temp 97.6°F | Resp 20 | Ht 70.0 in | Wt 232.9 lb

## 2019-12-05 DIAGNOSIS — C787 Secondary malignant neoplasm of liver and intrahepatic bile duct: Secondary | ICD-10-CM | POA: Diagnosis not present

## 2019-12-05 DIAGNOSIS — Z5112 Encounter for antineoplastic immunotherapy: Secondary | ICD-10-CM | POA: Diagnosis not present

## 2019-12-05 DIAGNOSIS — C187 Malignant neoplasm of sigmoid colon: Secondary | ICD-10-CM

## 2019-12-05 DIAGNOSIS — C189 Malignant neoplasm of colon, unspecified: Secondary | ICD-10-CM

## 2019-12-05 DIAGNOSIS — Z95828 Presence of other vascular implants and grafts: Secondary | ICD-10-CM

## 2019-12-05 LAB — CBC WITH DIFFERENTIAL (CANCER CENTER ONLY)
Abs Immature Granulocytes: 0.03 10*3/uL (ref 0.00–0.07)
Basophils Absolute: 0 10*3/uL (ref 0.0–0.1)
Basophils Relative: 0 %
Eosinophils Absolute: 0.1 10*3/uL (ref 0.0–0.5)
Eosinophils Relative: 2 %
HCT: 42.7 % (ref 39.0–52.0)
Hemoglobin: 13.5 g/dL (ref 13.0–17.0)
Immature Granulocytes: 1 %
Lymphocytes Relative: 36 %
Lymphs Abs: 2.3 10*3/uL (ref 0.7–4.0)
MCH: 28.4 pg (ref 26.0–34.0)
MCHC: 31.6 g/dL (ref 30.0–36.0)
MCV: 89.9 fL (ref 80.0–100.0)
Monocytes Absolute: 0.5 10*3/uL (ref 0.1–1.0)
Monocytes Relative: 8 %
Neutro Abs: 3.5 10*3/uL (ref 1.7–7.7)
Neutrophils Relative %: 53 %
Platelet Count: 134 10*3/uL — ABNORMAL LOW (ref 150–400)
RBC: 4.75 MIL/uL (ref 4.22–5.81)
RDW: 15.1 % (ref 11.5–15.5)
WBC Count: 6.5 10*3/uL (ref 4.0–10.5)
nRBC: 0 % (ref 0.0–0.2)

## 2019-12-05 LAB — CMP (CANCER CENTER ONLY)
ALT: 54 U/L — ABNORMAL HIGH (ref 0–44)
AST: 45 U/L — ABNORMAL HIGH (ref 15–41)
Albumin: 3.6 g/dL (ref 3.5–5.0)
Alkaline Phosphatase: 117 U/L (ref 38–126)
Anion gap: 16 — ABNORMAL HIGH (ref 5–15)
BUN: 10 mg/dL (ref 6–20)
CO2: 20 mmol/L — ABNORMAL LOW (ref 22–32)
Calcium: 9.3 mg/dL (ref 8.9–10.3)
Chloride: 101 mmol/L (ref 98–111)
Creatinine: 1.35 mg/dL — ABNORMAL HIGH (ref 0.61–1.24)
GFR, Est AFR Am: >60 mL/min (ref >60)
GFR, Estimated: 60 mL/min — ABNORMAL LOW (ref >60)
Glucose, Bld: 366 mg/dL — ABNORMAL HIGH (ref 70–99)
Potassium: 3.8 mmol/L (ref 3.5–5.1)
Sodium: 137 mmol/L (ref 135–145)
Total Bilirubin: 0.5 mg/dL (ref 0.3–1.2)
Total Protein: 7.8 g/dL (ref 6.5–8.1)

## 2019-12-05 LAB — CEA (IN HOUSE-CHCC): CEA (CHCC-In House): 7.95 ng/mL — ABNORMAL HIGH (ref 0.00–5.00)

## 2019-12-05 LAB — TOTAL PROTEIN, URINE DIPSTICK: Protein, ur: NEGATIVE mg/dL

## 2019-12-05 MED ORDER — ATROPINE SULFATE 1 MG/ML IJ SOLN
0.5000 mg | Freq: Once | INTRAMUSCULAR | Status: AC | PRN
  Administered 2019-12-05: 0.5 mg via INTRAVENOUS

## 2019-12-05 MED ORDER — SODIUM CHLORIDE 0.9 % IV SOLN
180.0000 mg/m2 | Freq: Once | INTRAVENOUS | Status: AC
  Administered 2019-12-05: 420 mg via INTRAVENOUS
  Filled 2019-12-05: qty 21

## 2019-12-05 MED ORDER — SODIUM CHLORIDE 0.9 % IV SOLN
5.0000 mg/kg | Freq: Once | INTRAVENOUS | Status: AC
  Administered 2019-12-05: 500 mg via INTRAVENOUS
  Filled 2019-12-05: qty 16

## 2019-12-05 MED ORDER — SODIUM CHLORIDE 0.9 % IV SOLN
150.0000 mg | Freq: Once | INTRAVENOUS | Status: AC
  Administered 2019-12-05: 150 mg via INTRAVENOUS
  Filled 2019-12-05: qty 5

## 2019-12-05 MED ORDER — FLUOROURACIL CHEMO INJECTION 2.5 GM/50ML
400.0000 mg/m2 | Freq: Once | INTRAVENOUS | Status: AC
  Administered 2019-12-05: 900 mg via INTRAVENOUS
  Filled 2019-12-05: qty 18

## 2019-12-05 MED ORDER — DEXAMETHASONE SODIUM PHOSPHATE 10 MG/ML IJ SOLN
INTRAMUSCULAR | Status: AC
  Filled 2019-12-05: qty 1

## 2019-12-05 MED ORDER — SODIUM CHLORIDE 0.9 % IV SOLN
400.0000 mg/m2 | Freq: Once | INTRAVENOUS | Status: AC
  Administered 2019-12-05: 912 mg via INTRAVENOUS
  Filled 2019-12-05: qty 45.6

## 2019-12-05 MED ORDER — ATROPINE SULFATE 1 MG/ML IJ SOLN
INTRAMUSCULAR | Status: AC
  Filled 2019-12-05: qty 1

## 2019-12-05 MED ORDER — DEXAMETHASONE SODIUM PHOSPHATE 10 MG/ML IJ SOLN
5.0000 mg | Freq: Once | INTRAMUSCULAR | Status: AC
  Administered 2019-12-05: 5 mg via INTRAVENOUS

## 2019-12-05 MED ORDER — SODIUM CHLORIDE 0.9 % IV SOLN
2000.0000 mg/m2 | INTRAVENOUS | Status: DC
  Administered 2019-12-05: 4550 mg via INTRAVENOUS
  Filled 2019-12-05: qty 91

## 2019-12-05 MED ORDER — PALONOSETRON HCL INJECTION 0.25 MG/5ML
0.2500 mg | Freq: Once | INTRAVENOUS | Status: AC
  Administered 2019-12-05: 0.25 mg via INTRAVENOUS

## 2019-12-05 MED ORDER — PALONOSETRON HCL INJECTION 0.25 MG/5ML
INTRAVENOUS | Status: AC
  Filled 2019-12-05: qty 5

## 2019-12-05 MED ORDER — SODIUM CHLORIDE 0.9 % IV SOLN
Freq: Once | INTRAVENOUS | Status: AC
  Filled 2019-12-05: qty 250

## 2019-12-05 MED ORDER — SODIUM CHLORIDE 0.9% FLUSH
10.0000 mL | INTRAVENOUS | Status: DC | PRN
  Administered 2019-12-05: 10 mL via INTRAVENOUS
  Filled 2019-12-05: qty 10

## 2019-12-05 NOTE — Patient Instructions (Signed)

## 2019-12-05 NOTE — Patient Instructions (Signed)
[facility] Discharge Instructions for Patients Receiving Chemotherapy  Today you received the following chemotherapy agents: Avastin, Irinotecan, Leucovorin, 5FU  To help prevent nausea and vomiting after your treatment, we encourage you to take your nausea medication as directed.   If you develop nausea and vomiting that is not controlled by your nausea medication, call the clinic.   BELOW ARE SYMPTOMS THAT SHOULD BE REPORTED IMMEDIATELY:  *FEVER GREATER THAN 100.5 F  *CHILLS WITH OR WITHOUT FEVER  NAUSEA AND VOMITING THAT IS NOT CONTROLLED WITH YOUR NAUSEA MEDICATION  *UNUSUAL SHORTNESS OF BREATH  *UNUSUAL BRUISING OR BLEEDING  TENDERNESS IN MOUTH AND THROAT WITH OR WITHOUT PRESENCE OF ULCERS  *URINARY PROBLEMS  *BOWEL PROBLEMS  UNUSUAL RASH Items with * indicate a potential emergency and should be followed up as soon as possible.  Feel free to call the clinic should you have any questions or concerns. The clinic phone number is [phone].  Please show the CHEMO ALERT CARD at check-in to the Emergency Department and triage nurse.   

## 2019-12-05 NOTE — Progress Notes (Signed)
[facility] OFFICE PROGRESS NOTE   Diagnosis: Colon cancer  INTERVAL HISTORY:   Mr. Calo returns as scheduled.  He completed cycle 6 FOLFIRI/bevacizumab 11/21/2019.  He noted much less nausea.  He thinks the Zyprexa helped.  No mouth sores.  No diarrhea.  He occasionally notes blood with bowel movements.  No shortness of breath or chest pain.  No seizure activity.  Objective:  Vital signs in last 24 hours:  Blood pressure (!) 120/89, pulse 100, temperature 97.6 F (36.4 C), temperature source Temporal, resp. rate 20, height _0  (1.778 m), weight (!) 232 lb 14.4 oz (105.6 kg), SpO2 100 %.    HEENT: No thrush or ulcers. Resp: Lungs clear bilaterally. Cardio: Regular rate and rhythm. GI: Abdomen soft and nontender.  No hepatomegaly. Vascular: No leg edema. Port-A-Cath without erythema.  Lab Results:  Lab Results  Component Value Date   WBC 6.5 12/05/2019   HGB 13.5 12/05/2019   HCT 42.7 12/05/2019   MCV 89.9 12/05/2019   PLT 134 (L) 12/05/2019   NEUTROABS 3.5 12/05/2019    Imaging:  No results found.  Medications: I have reviewed the patient's current medications.  Assessment/Plan: 1. 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 CTschest, abdomen, and pelvis on 12/11/2015-indeterminate tiny pulmonary nodules, multiple liver metastases, small nodes in the sigmoid mesocolon  3. Laparoscopic sigmoid colectomy, liver biopsy, Port-A-Cath placement 01/14/2016  4. Pathology sigmoid colon resection-colonic adenocarcinoma, 5.3 cm extending into pericolonic connective tissue, positive lymph-vascular invasion, positive perineural invasion, negative margins, metastatic carcinoma in9 of 28 lymph nodes  5. Pathology liver biopsy-metastatic colorectal adenocarcinoma  6. MSI stable; mismatch repair protein normal  7. 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 Cycle 1 FOLFOX 02/02/2016  9. Cycle 2 FOLFOX 02/15/2016  10. Cycle 3 FOLFOX 02/29/2016  11. Cycle 4 FOLFOX 03/14/2016  12. Cycle 5 FOLFOX 03/28/2016  13. Cycle 6 FOLFOX 04/11/2016 (oxaliplatin held secondary to thrombocytopenia)  14. 04/21/2016 restaging CTs-difficulty evaluating liver lesions due to hepatic steatosis. Stable right adrenal nodule. No adenopathy or local recurrence near the rectosigmoid anastomotic site.  15. Cycle 7 FOLFOX 04/25/2016  16. MRI liver 05/02/2016-partial improvement in hepatic metastases  17. Cycle 8 FOLFOX 05/10/2016  18. Cycle 9 FOLFOX 05/23/2016 (oxaliplatin held due to thrombocytopenia)  19. Cycle 10 FOLFOX 06/06/2016 (oxaliplatin dose reduced due to thrombocytopenia)  20. Cycle 11 FOLFOX 06/27/2016 (oxaliplatin held due to neuropathy)  21. Cycle 12 FOLFOX 07/11/2016 (oxaliplatin held) 22. Initiation of maintenance Xeloda 7 days on/7 days off 07/27/2016 23. MRI liver 11/18/2016-decrease in hepatic metastatic disease. No new or progressive disease identified within the abdomen. 24. Continuation of Xeloda 7 days on/7 days off 25. MRI liver 04/27/2017-previous liver lesions not identified, no new lesions, no lymphadenopathy 26. Xeloda continued 7 days on/7 days off 27. MRI liver 12/04/2017 -no evidence of metastatic disease, hepatic steatosis 28. Xeloda continued 7 days on/7 days off 29. MRI liver 07/15/2018-no evidence of metastatic disease. Stable severe hepatic steatosis. 30. Xeloda continued 7 days on/7 days off 31. MRI liver 03/16/2019-hepatic steatosis, no liver mass, focal area of intrahepatic biliary dilatation in segments 2 and 3 of the left lobe-increased 32. Xeloda continued 7 days on/7 days off 33. MRI abdomen 08/19/2019-no findings to suggest liver metastases. Bilateral lung nodules measuring up to 2.8 cm, progressive and more conspicuous than on previous exam 34. CT chest 08/29/2019-multiple pulmonary metastases, new from 04/21/2016 35. Cycle  1  FOLFIRI/bevacizumab 09/09/2019 36. Cycle 2 FOLFIRI/bevacizumab 09/26/2019 37. Cycle 3 FOLFIRI/bevacizumab 10/10/2019 38. Cycle 4 FOLFIRI/bevacizumab 10/24/2019 39. Cycle 5 FOLFIRI/bevacizumab 11/07/2019 40. CT chest 11/14/2019-decreased size of lung nodules, no new lesions, hepatic steatosis 41. Cycle 6 FOLFIRI/bevacizumab 11/21/2019 42. Cycle 7 FOLFIRI/bevacizumab 12/05/2019  2. Rectal bleeding and constipation secondary to #1  3. History of peripheral vascular disease, status post left lower extremity vascular bypass surgery in April 2017  4. History of nephrolithiasis  5. History of Graves' disease treated with radioactive iodine  6. Anxiety/depression  7. Hypertension  8. Hospitalization 01/19/2016 with wound dehiscence status post secondary suture closure of abdominal wall  9. 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 Hyperglycemia 06/20/2016-diagnosed with diabetes, maintained on insulin  11.Positive COVID test 12/13/2018  12. Hospitalized with seizure activity/DKA. Now on Keppra, insulin adjusted. 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    Disposition: Mr. Narramore appears stable.  He has completed 6 cycles of FOLFIRI/bevacizumab.  Plan to proceed with cycle 7 today as scheduled.  He noted significant improvement in the nausea with Zyprexa.  Plan to continue the same with cycle 7.  We reviewed the CBC from today.  Counts adequate to proceed with treatment.  We reviewed the chemistry panel.  Glucose elevated at 366.  He had insulin just prior to coming to the office.  He will continue to monitor closely at home.  He will return for lab, follow-up, cycle 8 FOLFIRI/bevacizumab in 2 weeks.    Ned Card ANP/GNP-BC   12/05/2019  11:29 AM

## 2019-12-05 NOTE — Telephone Encounter (Signed)
Scheduled per 07/22 los, patient will be notified per My chart.

## 2019-12-05 NOTE — Addendum Note (Signed)
Addended by: Betsy Coder B on: 12/05/2019 11:59 AM   Modules accepted: Orders

## 2019-12-07 ENCOUNTER — Inpatient Hospital Stay: Payer: Medicaid Other

## 2019-12-07 ENCOUNTER — Other Ambulatory Visit: Payer: Self-pay

## 2019-12-07 VITALS — BP 136/93 | HR 91 | Temp 98.0°F | Resp 18 | Ht 70.0 in

## 2019-12-07 DIAGNOSIS — C187 Malignant neoplasm of sigmoid colon: Secondary | ICD-10-CM

## 2019-12-07 DIAGNOSIS — Z5112 Encounter for antineoplastic immunotherapy: Secondary | ICD-10-CM | POA: Diagnosis not present

## 2019-12-07 MED ORDER — HEPARIN SOD (PORK) LOCK FLUSH 100 UNIT/ML IV SOLN
500.0000 [IU] | Freq: Once | INTRAVENOUS | Status: AC | PRN
  Administered 2019-12-07: 500 [IU]
  Filled 2019-12-07: qty 5

## 2019-12-07 MED ORDER — SODIUM CHLORIDE 0.9% FLUSH
10.0000 mL | INTRAVENOUS | Status: DC | PRN
  Administered 2019-12-07: 10 mL
  Filled 2019-12-07: qty 10

## 2019-12-15 ENCOUNTER — Other Ambulatory Visit: Payer: Self-pay | Admitting: Oncology

## 2019-12-19 ENCOUNTER — Inpatient Hospital Stay (HOSPITAL_BASED_OUTPATIENT_CLINIC_OR_DEPARTMENT_OTHER): Payer: Medicaid Other | Admitting: Nurse Practitioner

## 2019-12-19 ENCOUNTER — Inpatient Hospital Stay: Payer: Medicaid Other

## 2019-12-19 ENCOUNTER — Encounter: Payer: Self-pay | Admitting: Nurse Practitioner

## 2019-12-19 ENCOUNTER — Other Ambulatory Visit: Payer: Self-pay

## 2019-12-19 ENCOUNTER — Telehealth: Payer: Self-pay | Admitting: Nurse Practitioner

## 2019-12-19 ENCOUNTER — Inpatient Hospital Stay: Payer: Medicaid Other | Attending: Oncology

## 2019-12-19 VITALS — BP 135/99 | HR 93 | Temp 97.9°F | Resp 17 | Ht 70.0 in | Wt 232.4 lb

## 2019-12-19 DIAGNOSIS — C187 Malignant neoplasm of sigmoid colon: Secondary | ICD-10-CM | POA: Insufficient documentation

## 2019-12-19 DIAGNOSIS — C787 Secondary malignant neoplasm of liver and intrahepatic bile duct: Secondary | ICD-10-CM | POA: Diagnosis not present

## 2019-12-19 DIAGNOSIS — Z5111 Encounter for antineoplastic chemotherapy: Secondary | ICD-10-CM | POA: Insufficient documentation

## 2019-12-19 DIAGNOSIS — Z5112 Encounter for antineoplastic immunotherapy: Secondary | ICD-10-CM | POA: Insufficient documentation

## 2019-12-19 DIAGNOSIS — C189 Malignant neoplasm of colon, unspecified: Secondary | ICD-10-CM

## 2019-12-19 DIAGNOSIS — Z23 Encounter for immunization: Secondary | ICD-10-CM | POA: Insufficient documentation

## 2019-12-19 LAB — CMP (CANCER CENTER ONLY)
ALT: 63 U/L — ABNORMAL HIGH (ref 0–44)
AST: 48 U/L — ABNORMAL HIGH (ref 15–41)
Albumin: 3.7 g/dL (ref 3.5–5.0)
Alkaline Phosphatase: 128 U/L — ABNORMAL HIGH (ref 38–126)
Anion gap: 16 — ABNORMAL HIGH (ref 5–15)
BUN: 8 mg/dL (ref 6–20)
CO2: 20 mmol/L — ABNORMAL LOW (ref 22–32)
Calcium: 9.6 mg/dL (ref 8.9–10.3)
Chloride: 104 mmol/L (ref 98–111)
Creatinine: 1.31 mg/dL — ABNORMAL HIGH (ref 0.61–1.24)
GFR, Est AFR Am: >60 mL/min (ref >60)
GFR, Estimated: >60 mL/min (ref >60)
Glucose, Bld: 195 mg/dL — ABNORMAL HIGH (ref 70–99)
Potassium: 3.7 mmol/L (ref 3.5–5.1)
Sodium: 140 mmol/L (ref 135–145)
Total Bilirubin: 0.5 mg/dL (ref 0.3–1.2)
Total Protein: 7.8 g/dL (ref 6.5–8.1)

## 2019-12-19 LAB — CBC WITH DIFFERENTIAL (CANCER CENTER ONLY)
Abs Immature Granulocytes: 0.01 10*3/uL (ref 0.00–0.07)
Basophils Absolute: 0 10*3/uL (ref 0.0–0.1)
Basophils Relative: 0 %
Eosinophils Absolute: 0.2 10*3/uL (ref 0.0–0.5)
Eosinophils Relative: 3 %
HCT: 42.1 % (ref 39.0–52.0)
Hemoglobin: 13.5 g/dL (ref 13.0–17.0)
Immature Granulocytes: 0 %
Lymphocytes Relative: 44 %
Lymphs Abs: 2.9 10*3/uL (ref 0.7–4.0)
MCH: 28.5 pg (ref 26.0–34.0)
MCHC: 32.1 g/dL (ref 30.0–36.0)
MCV: 89 fL (ref 80.0–100.0)
Monocytes Absolute: 0.6 10*3/uL (ref 0.1–1.0)
Monocytes Relative: 9 %
Neutro Abs: 2.9 10*3/uL (ref 1.7–7.7)
Neutrophils Relative %: 44 %
Platelet Count: 132 10*3/uL — ABNORMAL LOW (ref 150–400)
RBC: 4.73 MIL/uL (ref 4.22–5.81)
RDW: 15.3 % (ref 11.5–15.5)
WBC Count: 6.6 10*3/uL (ref 4.0–10.5)
nRBC: 0 % (ref 0.0–0.2)

## 2019-12-19 LAB — TOTAL PROTEIN, URINE DIPSTICK: Protein, ur: NEGATIVE mg/dL

## 2019-12-19 LAB — CEA (IN HOUSE-CHCC): CEA (CHCC-In House): 6.68 ng/mL — ABNORMAL HIGH (ref 0.00–5.00)

## 2019-12-19 MED ORDER — DEXAMETHASONE SODIUM PHOSPHATE 10 MG/ML IJ SOLN
INTRAMUSCULAR | Status: AC
  Filled 2019-12-19: qty 1

## 2019-12-19 MED ORDER — SODIUM CHLORIDE 0.9 % IV SOLN
400.0000 mg/m2 | Freq: Once | INTRAVENOUS | Status: AC
  Administered 2019-12-19: 912 mg via INTRAVENOUS
  Filled 2019-12-19: qty 45.6

## 2019-12-19 MED ORDER — ATROPINE SULFATE 0.4 MG/ML IJ SOLN
0.4000 mg | Freq: Once | INTRAMUSCULAR | Status: AC | PRN
  Administered 2019-12-19: 0.4 mg via INTRAVENOUS

## 2019-12-19 MED ORDER — SODIUM CHLORIDE 0.9 % IV SOLN
180.0000 mg/m2 | Freq: Once | INTRAVENOUS | Status: AC
  Administered 2019-12-19: 420 mg via INTRAVENOUS
  Filled 2019-12-19: qty 15

## 2019-12-19 MED ORDER — ATROPINE SULFATE 0.4 MG/ML IJ SOLN
INTRAMUSCULAR | Status: AC
  Filled 2019-12-19: qty 1

## 2019-12-19 MED ORDER — FLUOROURACIL CHEMO INJECTION 2.5 GM/50ML
400.0000 mg/m2 | Freq: Once | INTRAVENOUS | Status: AC
  Administered 2019-12-19: 900 mg via INTRAVENOUS
  Filled 2019-12-19: qty 18

## 2019-12-19 MED ORDER — SODIUM CHLORIDE 0.9 % IV SOLN
Freq: Once | INTRAVENOUS | Status: AC
  Filled 2019-12-19: qty 250

## 2019-12-19 MED ORDER — SODIUM CHLORIDE 0.9 % IV SOLN
5.0000 mg/kg | Freq: Once | INTRAVENOUS | Status: AC
  Administered 2019-12-19: 500 mg via INTRAVENOUS
  Filled 2019-12-19: qty 16

## 2019-12-19 MED ORDER — DEXAMETHASONE SODIUM PHOSPHATE 10 MG/ML IJ SOLN
5.0000 mg | Freq: Once | INTRAMUSCULAR | Status: AC
  Administered 2019-12-19: 5 mg via INTRAVENOUS

## 2019-12-19 MED ORDER — SODIUM CHLORIDE 0.9 % IV SOLN
150.0000 mg | Freq: Once | INTRAVENOUS | Status: AC
  Administered 2019-12-19: 150 mg via INTRAVENOUS
  Filled 2019-12-19: qty 150

## 2019-12-19 MED ORDER — ATROPINE SULFATE 1 MG/ML IJ SOLN: 0.5000 mg | Freq: Once | INTRAMUSCULAR | Status: DC | PRN

## 2019-12-19 MED ORDER — PALONOSETRON HCL INJECTION 0.25 MG/5ML
0.2500 mg | Freq: Once | INTRAVENOUS | Status: AC
  Administered 2019-12-19: 0.25 mg via INTRAVENOUS

## 2019-12-19 MED ORDER — SODIUM CHLORIDE 0.9 % IV SOLN
2000.0000 mg/m2 | INTRAVENOUS | Status: DC
  Administered 2019-12-19: 4550 mg via INTRAVENOUS
  Filled 2019-12-19: qty 91

## 2019-12-19 MED ORDER — PALONOSETRON HCL INJECTION 0.25 MG/5ML
INTRAVENOUS | Status: AC
  Filled 2019-12-19: qty 5

## 2019-12-19 NOTE — Progress Notes (Signed)
[facility] OFFICE PROGRESS NOTE   Diagnosis: Colon cancer  INTERVAL HISTORY:   Mr. Schriver returns as scheduled.  He completed cycle 7 FOLFIRI/bevacizumab 12/05/2019.  He had no nausea following the chemotherapy.  He attributes this to taking Zyprexa.  No mouth sores.  No diarrhea.  He denies fever, cough, shortness of breath.  No bleeding.  He reports anticipatory nausea beginning 1 to 2 days before each chemotherapy session.  He has Ativan to take as needed.  Objective:  Vital signs in last 24 hours:  Blood pressure (!) 135/99, pulse 93, temperature 97.9 F (36.6 C), temperature source Temporal, resp. rate 17, height 5' 10" (1.778 m), weight 232 lb 6.4 oz (105.4 kg), SpO2 100 %.    HEENT: No thrush or ulcers. Resp: Lungs clear bilaterally. Cardio: Regular rate and rhythm. GI: Abdomen soft and nontender.  No hepatomegaly. Vascular: No leg edema. Neuro: Alert and oriented.  Port-A-Cath without erythema.  Lab Results:  Lab Results  Component Value Date   WBC 6.6 12/19/2019   HGB 13.5 12/19/2019   HCT 42.1 12/19/2019   MCV 89.0 12/19/2019   PLT 132 (L) 12/19/2019   NEUTROABS 2.9 12/19/2019    Imaging:  No results found.  Medications: I have reviewed the patient's current medications.  Assessment/Plan: 1. 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 CTschest, abdomen, and pelvis on 12/11/2015-indeterminate tiny pulmonary nodules, multiple liver metastases, small nodes in the sigmoid mesocolon  3. Laparoscopic sigmoid colectomy, liver biopsy, Port-A-Cath placement 01/14/2016  4. Pathology sigmoid colon resection-colonic adenocarcinoma, 5.3 cm extending into pericolonic connective tissue, positive lymph-vascular invasion, positive perineural invasion, negative margins, metastatic carcinoma in9 of 28 lymph nodes  5. Pathology liver biopsy-metastatic colorectal  adenocarcinoma  6. MSI stable; mismatch repair protein normal  7. 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 Cycle 1 FOLFOX 02/02/2016  9. Cycle 2 FOLFOX 02/15/2016  10. Cycle 3 FOLFOX 02/29/2016  11. Cycle 4 FOLFOX 03/14/2016  12. Cycle 5 FOLFOX 03/28/2016  13. Cycle 6 FOLFOX 04/11/2016 (oxaliplatin held secondary to thrombocytopenia)  14. 04/21/2016 restaging CTs-difficulty evaluating liver lesions due to hepatic steatosis. Stable right adrenal nodule. No adenopathy or local recurrence near the rectosigmoid anastomotic site.  15. Cycle 7 FOLFOX 04/25/2016  16. MRI liver 05/02/2016-partial improvement in hepatic metastases  17. Cycle 8 FOLFOX 05/10/2016  18. Cycle 9 FOLFOX 05/23/2016 (oxaliplatin held due to thrombocytopenia)  19. Cycle 10 FOLFOX 06/06/2016 (oxaliplatin dose reduced due to thrombocytopenia)  20. Cycle 11 FOLFOX 06/27/2016 (oxaliplatin held due to neuropathy)  21. Cycle 12 FOLFOX 07/11/2016 (oxaliplatin held) 22. Initiation of maintenance Xeloda 7 days on/7 days off 07/27/2016 23. MRI liver 11/18/2016-decrease in hepatic metastatic disease. No new or progressive disease identified within the abdomen. 24. Continuation of Xeloda 7 days on/7 days off 25. MRI liver 04/27/2017-previous liver lesions not identified, no new lesions, no lymphadenopathy 26. Xeloda continued 7 days on/7 days off 27. MRI liver 12/04/2017 -no evidence of metastatic disease, hepatic steatosis 28. Xeloda continued 7 days on/7 days off 29. MRI liver 07/15/2018-no evidence of metastatic disease. Stable severe hepatic steatosis. 30. Xeloda continued 7 days on/7 days off 31. MRI liver 03/16/2019-hepatic steatosis, no liver mass, focal area of intrahepatic biliary dilatation in segments 2 and 3 of the left lobe-increased 32. Xeloda continued 7 days on/7 days off 33. MRI abdomen 08/19/2019-no findings to suggest liver metastases. Bilateral lung nodules measuring up to 2.8 cm, progressive  and more  conspicuous than on previous exam 34. CT chest 08/29/2019-multiple pulmonary metastases, new from 04/21/2016 35. Cycle 1 FOLFIRI/bevacizumab 09/09/2019 36. Cycle 2 FOLFIRI/bevacizumab 09/26/2019 37. Cycle 3 FOLFIRI/bevacizumab 10/10/2019 38. Cycle 4 FOLFIRI/bevacizumab 10/24/2019 39. Cycle 5 FOLFIRI/bevacizumab 11/07/2019 40. CT chest 11/14/2019-decreased size of lung nodules, no new lesions, hepatic steatosis 41. Cycle 6 FOLFIRI/bevacizumab 11/21/2019 42. Cycle 7 FOLFIRI/bevacizumab 12/05/2019 43. Cycle 8 FOLFIRI/bevacizumab 12/19/2019  2. Rectal bleeding and constipation secondary to #1  3. History of peripheral vascular disease, status post left lower extremity vascular bypass surgery in April 2017  4. History of nephrolithiasis  5. History of Graves' disease treated with radioactive iodine  6. Anxiety/depression  7. Hypertension  8. Hospitalization 01/19/2016 with wound dehiscence status post secondary suture closure of abdominal wall  9. 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 Hyperglycemia 06/20/2016-diagnosed with diabetes, maintained on insulin  11.Positive COVID test 12/13/2018  12. Hospitalized with seizure activity/DKA. Now on Keppra, insulin adjusted. 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    Disposition: Jeff [name] appears stable.  He has completed 7 cycles of FOLFIRI/bevacizumab.  There is no clinical evidence of disease progression.  Most recent CEA was further improved.  He is tolerating treatment well.  Plan to proceed with cycle 8 today as scheduled.  Plan is for restaging CTs after he has completed 9 cycles.  We reviewed the CBC and chemistry panel from today.  Labs adequate to proceed as above.  He will return for lab, follow-up, treatment in 2  weeks.    Ned Card ANP/GNP-BC   12/19/2019  10:25 AM

## 2019-12-19 NOTE — Patient Instructions (Signed)
[facility] Discharge Instructions for Patients Receiving Chemotherapy  Today you received the following chemotherapy agents: Avastin, Irinotecan, Leucovorin, 5FU  To help prevent nausea and vomiting after your treatment, we encourage you to take your nausea medication as directed.   If you develop nausea and vomiting that is not controlled by your nausea medication, call the clinic.   BELOW ARE SYMPTOMS THAT SHOULD BE REPORTED IMMEDIATELY:  *FEVER GREATER THAN 100.5 F  *CHILLS WITH OR WITHOUT FEVER  NAUSEA AND VOMITING THAT IS NOT CONTROLLED WITH YOUR NAUSEA MEDICATION  *UNUSUAL SHORTNESS OF BREATH  *UNUSUAL BRUISING OR BLEEDING  TENDERNESS IN MOUTH AND THROAT WITH OR WITHOUT PRESENCE OF ULCERS  *URINARY PROBLEMS  *BOWEL PROBLEMS  UNUSUAL RASH Items with * indicate a potential emergency and should be followed up as soon as possible.  Feel free to call the clinic should you have any questions or concerns. The clinic phone number is [phone].  Please show the CHEMO ALERT CARD at check-in to the Emergency Department and triage nurse.   

## 2019-12-19 NOTE — Telephone Encounter (Signed)
Scheduled per los. Declined printout  

## 2019-12-21 ENCOUNTER — Inpatient Hospital Stay: Payer: Medicaid Other

## 2019-12-21 ENCOUNTER — Other Ambulatory Visit: Payer: Self-pay

## 2019-12-21 VITALS — BP 136/97 | HR 113 | Temp 98.3°F | Resp 18

## 2019-12-21 DIAGNOSIS — Z5112 Encounter for antineoplastic immunotherapy: Secondary | ICD-10-CM | POA: Diagnosis not present

## 2019-12-21 DIAGNOSIS — C187 Malignant neoplasm of sigmoid colon: Secondary | ICD-10-CM

## 2019-12-21 MED ORDER — SODIUM CHLORIDE 0.9% FLUSH
10.0000 mL | INTRAVENOUS | Status: DC | PRN
  Administered 2019-12-21: 10 mL
  Filled 2019-12-21: qty 10

## 2019-12-21 MED ORDER — HEPARIN SOD (PORK) LOCK FLUSH 100 UNIT/ML IV SOLN
500.0000 [IU] | Freq: Once | INTRAVENOUS | Status: AC | PRN
  Administered 2019-12-21: 500 [IU]
  Filled 2019-12-21: qty 5

## 2019-12-21 NOTE — Patient Instructions (Signed)

## 2019-12-29 ENCOUNTER — Other Ambulatory Visit: Payer: Self-pay | Admitting: Oncology

## 2019-12-31 ENCOUNTER — Other Ambulatory Visit: Payer: Self-pay | Admitting: *Deleted

## 2019-12-31 ENCOUNTER — Other Ambulatory Visit: Payer: Self-pay | Admitting: Oncology

## 2019-12-31 DIAGNOSIS — C187 Malignant neoplasm of sigmoid colon: Secondary | ICD-10-CM

## 2020-01-02 ENCOUNTER — Other Ambulatory Visit: Payer: Self-pay

## 2020-01-02 ENCOUNTER — Inpatient Hospital Stay: Payer: Medicaid Other

## 2020-01-02 ENCOUNTER — Inpatient Hospital Stay (HOSPITAL_BASED_OUTPATIENT_CLINIC_OR_DEPARTMENT_OTHER): Payer: Medicaid Other | Admitting: Oncology

## 2020-01-02 VITALS — BP 143/97 | HR 112 | Temp 98.7°F | Resp 17 | Ht 70.0 in | Wt 231.8 lb

## 2020-01-02 VITALS — HR 100

## 2020-01-02 DIAGNOSIS — C187 Malignant neoplasm of sigmoid colon: Secondary | ICD-10-CM | POA: Diagnosis not present

## 2020-01-02 DIAGNOSIS — Z5112 Encounter for antineoplastic immunotherapy: Secondary | ICD-10-CM | POA: Diagnosis not present

## 2020-01-02 DIAGNOSIS — Z95828 Presence of other vascular implants and grafts: Secondary | ICD-10-CM

## 2020-01-02 DIAGNOSIS — Z23 Encounter for immunization: Secondary | ICD-10-CM

## 2020-01-02 LAB — CMP (CANCER CENTER ONLY)
ALT: 60 U/L — ABNORMAL HIGH (ref 0–44)
AST: 50 U/L — ABNORMAL HIGH (ref 15–41)
Albumin: 3.8 g/dL (ref 3.5–5.0)
Alkaline Phosphatase: 100 U/L (ref 38–126)
Anion gap: 17 — ABNORMAL HIGH (ref 5–15)
BUN: 8 mg/dL (ref 6–20)
CO2: 20 mmol/L — ABNORMAL LOW (ref 22–32)
Calcium: 9.6 mg/dL (ref 8.9–10.3)
Chloride: 103 mmol/L (ref 98–111)
Creatinine: 1.3 mg/dL — ABNORMAL HIGH (ref 0.61–1.24)
GFR, Est AFR Am: >60 mL/min (ref >60)
GFR, Estimated: >60 mL/min (ref >60)
Glucose, Bld: 252 mg/dL — ABNORMAL HIGH (ref 70–99)
Potassium: 3.4 mmol/L — ABNORMAL LOW (ref 3.5–5.1)
Sodium: 140 mmol/L (ref 135–145)
Total Bilirubin: 0.5 mg/dL (ref 0.3–1.2)
Total Protein: 8 g/dL (ref 6.5–8.1)

## 2020-01-02 LAB — CBC WITH DIFFERENTIAL (CANCER CENTER ONLY)
Abs Immature Granulocytes: 0.02 10*3/uL (ref 0.00–0.07)
Basophils Absolute: 0 10*3/uL (ref 0.0–0.1)
Basophils Relative: 0 %
Eosinophils Absolute: 0.2 10*3/uL (ref 0.0–0.5)
Eosinophils Relative: 2 %
HCT: 43.9 % (ref 39.0–52.0)
Hemoglobin: 13.8 g/dL (ref 13.0–17.0)
Immature Granulocytes: 0 %
Lymphocytes Relative: 44 %
Lymphs Abs: 3.5 10*3/uL (ref 0.7–4.0)
MCH: 27.9 pg (ref 26.0–34.0)
MCHC: 31.4 g/dL (ref 30.0–36.0)
MCV: 88.7 fL (ref 80.0–100.0)
Monocytes Absolute: 0.6 10*3/uL (ref 0.1–1.0)
Monocytes Relative: 8 %
Neutro Abs: 3.5 10*3/uL (ref 1.7–7.7)
Neutrophils Relative %: 46 %
Platelet Count: 136 10*3/uL — ABNORMAL LOW (ref 150–400)
RBC: 4.95 MIL/uL (ref 4.22–5.81)
RDW: 15.8 % — ABNORMAL HIGH (ref 11.5–15.5)
WBC Count: 7.8 10*3/uL (ref 4.0–10.5)
nRBC: 0 % (ref 0.0–0.2)

## 2020-01-02 MED ORDER — DEXAMETHASONE SODIUM PHOSPHATE 10 MG/ML IJ SOLN
INTRAMUSCULAR | Status: AC
  Filled 2020-01-02: qty 1

## 2020-01-02 MED ORDER — PALONOSETRON HCL INJECTION 0.25 MG/5ML
0.2500 mg | Freq: Once | INTRAVENOUS | Status: AC
  Administered 2020-01-02: 0.25 mg via INTRAVENOUS

## 2020-01-02 MED ORDER — SODIUM CHLORIDE 0.9 % IV SOLN
5.0000 mg/kg | Freq: Once | INTRAVENOUS | Status: AC
  Administered 2020-01-02: 500 mg via INTRAVENOUS
  Filled 2020-01-02: qty 4

## 2020-01-02 MED ORDER — SODIUM CHLORIDE 0.9% FLUSH
10.0000 mL | INTRAVENOUS | Status: DC | PRN
  Administered 2020-01-02: 10 mL via INTRAVENOUS
  Filled 2020-01-02: qty 10

## 2020-01-02 MED ORDER — SODIUM CHLORIDE 0.9 % IV SOLN
400.0000 mg/m2 | Freq: Once | INTRAVENOUS | Status: AC
  Administered 2020-01-02: 912 mg via INTRAVENOUS
  Filled 2020-01-02: qty 45.6

## 2020-01-02 MED ORDER — SODIUM CHLORIDE 0.9 % IV SOLN
150.0000 mg | Freq: Once | INTRAVENOUS | Status: AC
  Administered 2020-01-02: 150 mg via INTRAVENOUS
  Filled 2020-01-02: qty 150

## 2020-01-02 MED ORDER — SODIUM CHLORIDE 0.9 % IV SOLN
2000.0000 mg/m2 | INTRAVENOUS | Status: DC
  Administered 2020-01-02: 4550 mg via INTRAVENOUS
  Filled 2020-01-02: qty 91

## 2020-01-02 MED ORDER — FLUOROURACIL CHEMO INJECTION 2.5 GM/50ML
400.0000 mg/m2 | Freq: Once | INTRAVENOUS | Status: AC
  Administered 2020-01-02: 900 mg via INTRAVENOUS
  Filled 2020-01-02: qty 18

## 2020-01-02 MED ORDER — ATROPINE SULFATE 0.4 MG/ML IJ SOLN
0.4000 mg | Freq: Once | INTRAMUSCULAR | Status: AC
  Administered 2020-01-02: 0.4 mg via INTRAVENOUS

## 2020-01-02 MED ORDER — ATROPINE SULFATE 0.4 MG/ML IJ SOLN
INTRAMUSCULAR | Status: AC
  Filled 2020-01-02: qty 1

## 2020-01-02 MED ORDER — SODIUM CHLORIDE 0.9 % IV SOLN
180.0000 mg/m2 | Freq: Once | INTRAVENOUS | Status: AC
  Administered 2020-01-02: 420 mg via INTRAVENOUS
  Filled 2020-01-02: qty 15

## 2020-01-02 MED ORDER — DEXAMETHASONE SODIUM PHOSPHATE 10 MG/ML IJ SOLN
5.0000 mg | Freq: Once | INTRAMUSCULAR | Status: AC
  Administered 2020-01-02: 5 mg via INTRAVENOUS

## 2020-01-02 MED ORDER — SODIUM CHLORIDE 0.9 % IV SOLN
Freq: Once | INTRAVENOUS | Status: AC
  Filled 2020-01-02: qty 250

## 2020-01-02 MED ORDER — PALONOSETRON HCL INJECTION 0.25 MG/5ML
INTRAVENOUS | Status: AC
  Filled 2020-01-02: qty 5

## 2020-01-02 MED FILL — TAMSULOSIN HCL 0.4 MG CAP: 0.4 | 30 days supply | Qty: 30 | Fill #0

## 2020-01-02 NOTE — Progress Notes (Signed)
Per Dr. Benay Spice: OK to treat w/tachycardia of 111-112/min

## 2020-01-02 NOTE — Progress Notes (Signed)
[name] OFFICE PROGRESS NOTE   Diagnosis: Colon cancer  INTERVAL HISTORY:   Jeff [name] completed another cycle of FOLFIRI on 12/19/2019.  He reports improved nausea with the addition of Zyprexa.  No diarrhea.  Good appetite.  He is monitoring his blood sugar.  No seizures.  He tripped in his house and developed a laceration at the chin.  He has mild bleeding when he cleans his nose.  Objective:  Vital signs in last 24 hours:  Blood pressure (!) 143/97, pulse (!) 111, temperature 98.7 F (37.1 C), temperature source Tympanic, resp. rate 17, height $RemoveBe'5\' 10"'SwkcqqlWw$  (1.778 m), weight 231 lb 12.8 oz (105.1 kg), SpO2 100 %.    HEENT: No thrush or ulcers Resp: Lungs clear bilaterally Cardio: Regular rate and rhythm, tachycardia GI: No hepatomegaly, nontender Vascular: No leg edema  Skin: Laceration at the chin appears to be healing  Portacath/PICC-without erythema  Lab Results:  Lab Results  Component Value Date   WBC 7.8 01/02/2020   HGB 13.8 01/02/2020   HCT 43.9 01/02/2020   MCV 88.7 01/02/2020   PLT 136 (L) 01/02/2020   NEUTROABS 3.5 01/02/2020    CMP  Lab Results  Component Value Date   NA 140 12/19/2019   K 3.7 12/19/2019   CL 104 12/19/2019   CO2 20 (L) 12/19/2019   GLUCOSE 195 (H) 12/19/2019   BUN 8 12/19/2019   CREATININE 1.31 (H) 12/19/2019   CALCIUM 9.6 12/19/2019   PROT 7.8 12/19/2019   ALBUMIN 3.7 12/19/2019   AST 48 (H) 12/19/2019   ALT 63 (H) 12/19/2019   ALKPHOS 128 (H) 12/19/2019   BILITOT 0.5 12/19/2019   GFRNONAA >60 12/19/2019   GFRAA >60 12/19/2019     Medications: I have reviewed the patient's current medications.   Assessment/Plan: 1. 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 CTschest, abdomen, and pelvis on 12/11/2015-indeterminate tiny pulmonary nodules, multiple liver metastases, small nodes in the sigmoid mesocolon   3. Laparoscopic sigmoid colectomy, liver biopsy, Port-A-Cath placement 01/14/2016  4. Pathology sigmoid colon resection-colonic adenocarcinoma, 5.3 cm extending into pericolonic connective tissue, positive lymph-vascular invasion, positive perineural invasion, negative margins, metastatic carcinoma in9 of 28 lymph nodes  5. Pathology liver biopsy-metastatic colorectal adenocarcinoma  6. MSI stable; mismatch repair protein normal  7. 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 Cycle 1 FOLFOX 02/02/2016  9. Cycle 2 FOLFOX 02/15/2016  10. Cycle 3 FOLFOX 02/29/2016  11. Cycle 4 FOLFOX 03/14/2016  12. Cycle 5 FOLFOX 03/28/2016  13. Cycle 6 FOLFOX 04/11/2016 (oxaliplatin held secondary to thrombocytopenia)  14. 04/21/2016 restaging CTs-difficulty evaluating liver lesions due to hepatic steatosis. Stable right adrenal nodule. No adenopathy or local recurrence near the rectosigmoid anastomotic site.  15. Cycle 7 FOLFOX 04/25/2016  16. MRI liver 05/02/2016-partial improvement in hepatic metastases  17. Cycle 8 FOLFOX 05/10/2016  18. Cycle 9 FOLFOX 05/23/2016 (oxaliplatin held due to thrombocytopenia)  19. Cycle 10 FOLFOX 06/06/2016 (oxaliplatin dose reduced due to thrombocytopenia)  20. Cycle 11 FOLFOX 06/27/2016 (oxaliplatin held due to neuropathy)  21. Cycle 12 FOLFOX 07/11/2016 (oxaliplatin held) 22. Initiation of maintenance Xeloda 7 days on/7 days off 07/27/2016 23. MRI liver 11/18/2016-decrease in hepatic metastatic disease. No new or progressive disease identified within the abdomen. 24. Continuation of Xeloda 7 days on/7 days off 25. MRI liver 04/27/2017-previous liver lesions not identified, no new lesions, no lymphadenopathy 26. Xeloda continued 7 days on/7 days off 27.  MRI liver 12/04/2017 -no evidence of metastatic disease, hepatic steatosis 28. Xeloda continued 7 days on/7 days off 29. MRI liver 07/15/2018-no evidence of metastatic disease. Stable severe hepatic  steatosis. 30. Xeloda continued 7 days on/7 days off 31. MRI liver 03/16/2019-hepatic steatosis, no liver mass, focal area of intrahepatic biliary dilatation in segments 2 and 3 of the left lobe-increased 32. Xeloda continued 7 days on/7 days off 33. MRI abdomen 08/19/2019-no findings to suggest liver metastases. Bilateral lung nodules measuring up to 2.8 cm, progressive and more conspicuous than on previous exam 34. CT chest 08/29/2019-multiple pulmonary metastases, new from 04/21/2016 35. Cycle 1 FOLFIRI/bevacizumab 09/09/2019 36. Cycle 2 FOLFIRI/bevacizumab 09/26/2019 37. Cycle 3 FOLFIRI/bevacizumab 10/10/2019 38. Cycle 4 FOLFIRI/bevacizumab 10/24/2019 39. Cycle 5 FOLFIRI/bevacizumab 11/07/2019 40. CT chest 11/14/2019-decreased size of lung nodules, no new lesions, hepatic steatosis 41. Cycle 6 FOLFIRI/bevacizumab 11/21/2019 42. Cycle 7 FOLFIRI/bevacizumab 12/05/2019 43. Cycle 8 FOLFIRI/bevacizumab 12/19/2019 44. Cycle 9 FOLFIRI/bevacizumab 01/02/2020  2. Rectal bleeding and constipation secondary to #1  3. History of peripheral vascular disease, status post left lower extremity vascular bypass surgery in April 2017  4. History of nephrolithiasis  5. History of Graves' disease treated with radioactive iodine  6. Anxiety/depression  7. Hypertension  8. Hospitalization 01/19/2016 with wound dehiscence status post secondary suture closure of abdominal wall  9. 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 Hyperglycemia 06/20/2016-diagnosed with diabetes, maintained on insulin  11.Positive COVID test 12/13/2018  12. Hospitalized with seizure activity/DKA. Now on Keppra, insulin adjusted. 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      Disposition: Jeff [name] appears stable.  He is  tolerating the FOLFIRI/bevacizumab well.  He will complete another cycle today.  The CEA was lower when he was here on 12/19/2019.  [name] will receive a Covid booster vaccine today.  He will return for an office visit and chemotherapy in 2 weeks.  Betsy Coder, MD  01/02/2020  8:50 AM

## 2020-01-04 ENCOUNTER — Inpatient Hospital Stay: Payer: Medicaid Other

## 2020-01-04 ENCOUNTER — Other Ambulatory Visit: Payer: Self-pay

## 2020-01-04 VITALS — BP 173/108 | HR 107 | Temp 98.7°F

## 2020-01-04 DIAGNOSIS — C187 Malignant neoplasm of sigmoid colon: Secondary | ICD-10-CM

## 2020-01-04 DIAGNOSIS — Z5112 Encounter for antineoplastic immunotherapy: Secondary | ICD-10-CM | POA: Diagnosis not present

## 2020-01-04 MED ORDER — SODIUM CHLORIDE 0.9% FLUSH
10.0000 mL | INTRAVENOUS | Status: DC | PRN
  Administered 2020-01-04: 10 mL
  Filled 2020-01-04: qty 10

## 2020-01-04 MED ORDER — HEPARIN SOD (PORK) LOCK FLUSH 100 UNIT/ML IV SOLN
500.0000 [IU] | Freq: Once | INTRAVENOUS | Status: AC | PRN
  Administered 2020-01-04: 500 [IU]
  Filled 2020-01-04: qty 5

## 2020-01-04 NOTE — Progress Notes (Signed)
BP elevated, pt stated he did not take BP medication this morning.  Pt feels well, no complaints.  Pt will take BP medication as soon as he returns home.  Verified he has ability to check BP at home.  Pt verbalized he will return to ED if BP remains high after taking BP medication.

## 2020-01-04 NOTE — Patient Instructions (Signed)

## 2020-01-13 MED FILL — TAMSULOSIN HCL 0.4 MG CAP: 0.4 | 30 days supply | Qty: 30 | Fill #0

## 2020-01-14 ENCOUNTER — Other Ambulatory Visit: Payer: Self-pay | Admitting: Oncology

## 2020-01-16 ENCOUNTER — Other Ambulatory Visit: Payer: Self-pay

## 2020-01-16 ENCOUNTER — Inpatient Hospital Stay: Payer: Medicaid Other | Attending: Oncology | Admitting: Oncology

## 2020-01-16 ENCOUNTER — Inpatient Hospital Stay: Payer: Medicaid Other

## 2020-01-16 ENCOUNTER — Telehealth: Payer: Self-pay | Admitting: Oncology

## 2020-01-16 VITALS — BP 120/82 | HR 118 | Temp 98.8°F | Resp 18 | Ht 70.0 in | Wt 232.9 lb

## 2020-01-16 DIAGNOSIS — Z5112 Encounter for antineoplastic immunotherapy: Secondary | ICD-10-CM | POA: Insufficient documentation

## 2020-01-16 DIAGNOSIS — Z5111 Encounter for antineoplastic chemotherapy: Secondary | ICD-10-CM | POA: Diagnosis present

## 2020-01-16 DIAGNOSIS — Z452 Encounter for adjustment and management of vascular access device: Secondary | ICD-10-CM | POA: Diagnosis not present

## 2020-01-16 DIAGNOSIS — C787 Secondary malignant neoplasm of liver and intrahepatic bile duct: Secondary | ICD-10-CM | POA: Diagnosis not present

## 2020-01-16 DIAGNOSIS — C187 Malignant neoplasm of sigmoid colon: Secondary | ICD-10-CM | POA: Insufficient documentation

## 2020-01-16 DIAGNOSIS — Z95828 Presence of other vascular implants and grafts: Secondary | ICD-10-CM

## 2020-01-16 DIAGNOSIS — C189 Malignant neoplasm of colon, unspecified: Secondary | ICD-10-CM

## 2020-01-16 LAB — CBC WITH DIFFERENTIAL (CANCER CENTER ONLY)
Abs Immature Granulocytes: 0.02 10*3/uL (ref 0.00–0.07)
Basophils Absolute: 0 10*3/uL (ref 0.0–0.1)
Basophils Relative: 0 %
Eosinophils Absolute: 0.2 10*3/uL (ref 0.0–0.5)
Eosinophils Relative: 2 %
HCT: 42.7 % (ref 39.0–52.0)
Hemoglobin: 13.8 g/dL (ref 13.0–17.0)
Immature Granulocytes: 0 %
Lymphocytes Relative: 43 %
Lymphs Abs: 3.1 10*3/uL (ref 0.7–4.0)
MCH: 28.5 pg (ref 26.0–34.0)
MCHC: 32.3 g/dL (ref 30.0–36.0)
MCV: 88 fL (ref 80.0–100.0)
Monocytes Absolute: 0.5 10*3/uL (ref 0.1–1.0)
Monocytes Relative: 7 %
Neutro Abs: 3.5 10*3/uL (ref 1.7–7.7)
Neutrophils Relative %: 48 %
Platelet Count: 151 10*3/uL (ref 150–400)
RBC: 4.85 MIL/uL (ref 4.22–5.81)
RDW: 15.9 % — ABNORMAL HIGH (ref 11.5–15.5)
WBC Count: 7.3 10*3/uL (ref 4.0–10.5)
nRBC: 0 % (ref 0.0–0.2)

## 2020-01-16 LAB — CMP (CANCER CENTER ONLY)
ALT: 75 U/L — ABNORMAL HIGH (ref 0–44)
AST: 59 U/L — ABNORMAL HIGH (ref 15–41)
Albumin: 3.7 g/dL (ref 3.5–5.0)
Alkaline Phosphatase: 110 U/L (ref 38–126)
Anion gap: 19 — ABNORMAL HIGH (ref 5–15)
BUN: 11 mg/dL (ref 6–20)
CO2: 19 mmol/L — ABNORMAL LOW (ref 22–32)
Calcium: 10.3 mg/dL (ref 8.9–10.3)
Chloride: 101 mmol/L (ref 98–111)
Creatinine: 1.31 mg/dL — ABNORMAL HIGH (ref 0.61–1.24)
GFR, Est AFR Am: >60 mL/min (ref >60)
GFR, Estimated: >60 mL/min (ref >60)
Glucose, Bld: 303 mg/dL — ABNORMAL HIGH (ref 70–99)
Potassium: 3.6 mmol/L (ref 3.5–5.1)
Sodium: 139 mmol/L (ref 135–145)
Total Bilirubin: 0.4 mg/dL (ref 0.3–1.2)
Total Protein: 8.4 g/dL — ABNORMAL HIGH (ref 6.5–8.1)

## 2020-01-16 LAB — CEA (IN HOUSE-CHCC): CEA (CHCC-In House): 5.36 ng/mL — ABNORMAL HIGH (ref 0.00–5.00)

## 2020-01-16 MED ORDER — SODIUM CHLORIDE 0.9 % IV SOLN
400.0000 mg/m2 | Freq: Once | INTRAVENOUS | Status: AC
  Administered 2020-01-16: 912 mg via INTRAVENOUS
  Filled 2020-01-16: qty 45.6

## 2020-01-16 MED ORDER — PALONOSETRON HCL INJECTION 0.25 MG/5ML
0.2500 mg | Freq: Once | INTRAVENOUS | Status: AC
  Administered 2020-01-16: 0.25 mg via INTRAVENOUS

## 2020-01-16 MED ORDER — ATROPINE SULFATE 0.4 MG/ML IJ SOLN
0.4000 mg | Freq: Once | INTRAMUSCULAR | Status: AC
  Administered 2020-01-16: 0.4 mg via INTRAVENOUS

## 2020-01-16 MED ORDER — DEXAMETHASONE SODIUM PHOSPHATE 10 MG/ML IJ SOLN
INTRAMUSCULAR | Status: AC
  Filled 2020-01-16: qty 1

## 2020-01-16 MED ORDER — SODIUM CHLORIDE 0.9 % IV SOLN
2000.0000 mg/m2 | INTRAVENOUS | Status: DC
  Administered 2020-01-16: 4550 mg via INTRAVENOUS
  Filled 2020-01-16: qty 91

## 2020-01-16 MED ORDER — OLANZAPINE 5 MG PO TABS: ORAL_TABLET | ORAL | 1 refills | Status: DC

## 2020-01-16 MED ORDER — SODIUM CHLORIDE 0.9 % IV SOLN
150.0000 mg | Freq: Once | INTRAVENOUS | Status: AC
  Administered 2020-01-16: 150 mg via INTRAVENOUS
  Filled 2020-01-16: qty 150

## 2020-01-16 MED ORDER — ATROPINE SULFATE 1 MG/ML IJ SOLN
INTRAMUSCULAR | Status: AC
  Filled 2020-01-16: qty 1

## 2020-01-16 MED ORDER — ATROPINE SULFATE 0.4 MG/ML IJ SOLN
INTRAMUSCULAR | Status: AC
  Filled 2020-01-16: qty 1

## 2020-01-16 MED ORDER — SODIUM CHLORIDE 0.9 % IV SOLN
180.0000 mg/m2 | Freq: Once | INTRAVENOUS | Status: AC
  Administered 2020-01-16: 420 mg via INTRAVENOUS
  Filled 2020-01-16: qty 21

## 2020-01-16 MED ORDER — SODIUM CHLORIDE 0.9 % IV SOLN
5.0000 mg/kg | Freq: Once | INTRAVENOUS | Status: AC
  Administered 2020-01-16: 500 mg via INTRAVENOUS
  Filled 2020-01-16: qty 16

## 2020-01-16 MED ORDER — PALONOSETRON HCL INJECTION 0.25 MG/5ML
INTRAVENOUS | Status: AC
  Filled 2020-01-16: qty 5

## 2020-01-16 MED ORDER — SODIUM CHLORIDE 0.9 % IV SOLN
Freq: Once | INTRAVENOUS | Status: AC
  Filled 2020-01-16: qty 250

## 2020-01-16 MED ORDER — DEXAMETHASONE SODIUM PHOSPHATE 10 MG/ML IJ SOLN
5.0000 mg | Freq: Once | INTRAMUSCULAR | Status: AC
  Administered 2020-01-16: 5 mg via INTRAVENOUS

## 2020-01-16 MED ORDER — FLUOROURACIL CHEMO INJECTION 2.5 GM/50ML
400.0000 mg/m2 | Freq: Once | INTRAVENOUS | Status: AC
  Administered 2020-01-16: 900 mg via INTRAVENOUS
  Filled 2020-01-16: qty 18

## 2020-01-16 MED ORDER — SODIUM CHLORIDE 0.9% FLUSH
10.0000 mL | INTRAVENOUS | Status: DC | PRN
  Administered 2020-01-16: 10 mL via INTRAVENOUS
  Filled 2020-01-16: qty 10

## 2020-01-16 NOTE — Patient Instructions (Signed)
[facility] Discharge Instructions for Patients Receiving Chemotherapy  Today you received the following chemotherapy agents: bevacizumab, irinotecan, leucovorin, and fluorouracil.  To help prevent nausea and vomiting after your treatment, we encourage you to take your nausea medication as directed.   If you develop nausea and vomiting that is not controlled by your nausea medication, call the clinic.   BELOW ARE SYMPTOMS THAT SHOULD BE REPORTED IMMEDIATELY:  *FEVER GREATER THAN 100.5 F  *CHILLS WITH OR WITHOUT FEVER  NAUSEA AND VOMITING THAT IS NOT CONTROLLED WITH YOUR NAUSEA MEDICATION  *UNUSUAL SHORTNESS OF BREATH  *UNUSUAL BRUISING OR BLEEDING  TENDERNESS IN MOUTH AND THROAT WITH OR WITHOUT PRESENCE OF ULCERS  *URINARY PROBLEMS  *BOWEL PROBLEMS  UNUSUAL RASH Items with * indicate a potential emergency and should be followed up as soon as possible.  Feel free to call the clinic should you have any questions or concerns. The clinic phone number is [phone].  Please show the [facility] at check-in to the Emergency Department and triage nurse.

## 2020-01-16 NOTE — Progress Notes (Signed)
Per Dr. Benay Spice: OK to treat w/pulse 118

## 2020-01-16 NOTE — Telephone Encounter (Signed)
Scheduled per 9/2 los. Pt is aware of appt times and dates. Requested appts early AM. No avs or calendar needed to be printed.

## 2020-01-16 NOTE — Progress Notes (Signed)
[facility] OFFICE PROGRESS NOTE   Diagnosis: Colon cancer  INTERVAL HISTORY:   [name] completed another cycle of FOLFIRI/bevacizumab on 01/02/2020.  He reports less nausea following chemotherapy when taking Zyprexa.  No diarrhea.  No mouth sores.  He feels well.  [name] reports his diabetes has been under better control.  Objective:  Vital signs in last 24 hours:  Blood pressure 120/82, pulse (!) 118, temperature 98.8 F (37.1 C), temperature source Tympanic, resp. rate 18, height $RemoveBe'5\' 10"'WnDHEimAx$  (1.778 m), weight 232 lb 14.4 oz (105.6 kg), SpO2 100 %.    HEENT: No thrush or ulcers Resp: Lungs clear bilaterally Cardio: Regular rate and rhythm, tachycardia GI: No hepatosplenomegaly, nontender Vascular: No leg edema  Skin: Dryness of the palms  Portacath/PICC-without erythema  Lab Results:  Lab Results  Component Value Date   WBC 7.3 01/16/2020   HGB 13.8 01/16/2020   HCT 42.7 01/16/2020   MCV 88.0 01/16/2020   PLT 151 01/16/2020   NEUTROABS 3.5 01/16/2020    CMP  Lab Results  Component Value Date   NA 140 01/02/2020   K 3.4 (L) 01/02/2020   CL 103 01/02/2020   CO2 20 (L) 01/02/2020   GLUCOSE 252 (H) 01/02/2020   BUN 8 01/02/2020   CREATININE 1.30 (H) 01/02/2020   CALCIUM 9.6 01/02/2020   PROT 8.0 01/02/2020   ALBUMIN 3.8 01/02/2020   AST 50 (H) 01/02/2020   ALT 60 (H) 01/02/2020   ALKPHOS 100 01/02/2020   BILITOT 0.5 01/02/2020   GFRNONAA >60 01/02/2020   GFRAA >60 01/02/2020    Lab Results  Component Value Date   CEA1 6.68 (H) 12/19/2019     Medications: I have reviewed the patient's current medications.   Assessment/Plan: 1. 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 CTschest, abdomen, and pelvis on 12/11/2015-indeterminate tiny pulmonary nodules, multiple liver metastases, small nodes in the sigmoid mesocolon  3. Laparoscopic sigmoid  colectomy, liver biopsy, Port-A-Cath placement 01/14/2016  4. Pathology sigmoid colon resection-colonic adenocarcinoma, 5.3 cm extending into pericolonic connective tissue, positive lymph-vascular invasion, positive perineural invasion, negative margins, metastatic carcinoma in9 of 28 lymph nodes  5. Pathology liver biopsy-metastatic colorectal adenocarcinoma  6. MSI stable; mismatch repair protein normal  7. 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 Cycle 1 FOLFOX 02/02/2016  9. Cycle 2 FOLFOX 02/15/2016  10. Cycle 3 FOLFOX 02/29/2016  11. Cycle 4 FOLFOX 03/14/2016  12. Cycle 5 FOLFOX 03/28/2016  13. Cycle 6 FOLFOX 04/11/2016 (oxaliplatin held secondary to thrombocytopenia)  14. 04/21/2016 restaging CTs-difficulty evaluating liver lesions due to hepatic steatosis. Stable right adrenal nodule. No adenopathy or local recurrence near the rectosigmoid anastomotic site.  15. Cycle 7 FOLFOX 04/25/2016  16. MRI liver 05/02/2016-partial improvement in hepatic metastases  17. Cycle 8 FOLFOX 05/10/2016  18. Cycle 9 FOLFOX 05/23/2016 (oxaliplatin held due to thrombocytopenia)  19. Cycle 10 FOLFOX 06/06/2016 (oxaliplatin dose reduced due to thrombocytopenia)  20. Cycle 11 FOLFOX 06/27/2016 (oxaliplatin held due to neuropathy)  21. Cycle 12 FOLFOX 07/11/2016 (oxaliplatin held) 22. Initiation of maintenance Xeloda 7 days on/7 days off 07/27/2016 23. MRI liver 11/18/2016-decrease in hepatic metastatic disease. No new or progressive disease identified within the abdomen. 24. Continuation of Xeloda 7 days on/7 days off 25. MRI liver 04/27/2017-previous liver lesions not identified, no new lesions, no lymphadenopathy 26. Xeloda continued 7 days on/7 days off 27. MRI liver 12/04/2017 -no evidence of metastatic disease,  hepatic steatosis 28. Xeloda continued 7 days on/7 days off 29. MRI liver 07/15/2018-no evidence of metastatic disease. Stable severe hepatic steatosis. 30. Xeloda continued 7  days on/7 days off 31. MRI liver 03/16/2019-hepatic steatosis, no liver mass, focal area of intrahepatic biliary dilatation in segments 2 and 3 of the left lobe-increased 32. Xeloda continued 7 days on/7 days off 33. MRI abdomen 08/19/2019-no findings to suggest liver metastases. Bilateral lung nodules measuring up to 2.8 cm, progressive and more conspicuous than on previous exam 34. CT chest 08/29/2019-multiple pulmonary metastases, new from 04/21/2016 35. Cycle 1 FOLFIRI/bevacizumab 09/09/2019 36. Cycle 2 FOLFIRI/bevacizumab 09/26/2019 37. Cycle 3 FOLFIRI/bevacizumab 10/10/2019 38. Cycle 4 FOLFIRI/bevacizumab 10/24/2019 39. Cycle 5 FOLFIRI/bevacizumab 11/07/2019 40. CT chest 11/14/2019-decreased size of lung nodules, no new lesions, hepatic steatosis 41. Cycle 6 FOLFIRI/bevacizumab 11/21/2019 42. Cycle 7 FOLFIRI/bevacizumab 12/05/2019 43. Cycle 8 FOLFIRI/bevacizumab 12/19/2019 44. Cycle 9 FOLFIRI/bevacizumab 01/02/2020 45. Cycle 10 FOLFIRI/bevacizumab 01/16/2020  2. Rectal bleeding and constipation secondary to #1  3. History of peripheral vascular disease, status post left lower extremity vascular bypass surgery in April 2017  4. History of nephrolithiasis  5. History of Graves' disease treated with radioactive iodine  6. Anxiety/depression  7. Hypertension  8. Hospitalization 01/19/2016 with wound dehiscence status post secondary suture closure of abdominal wall  9. 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 Hyperglycemia 06/20/2016-diagnosed with diabetes, maintained on insulin  11.Positive COVID test 12/13/2018  12. Hospitalized with seizure activity/DKA. Now on Keppra, insulin adjusted. 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        Disposition: [name] appears stable.   He will complete another cycle of FOLFIRI/bevacizumab today.  The CEA was again lower when he was here on 12/19/2019.  He will be scheduled for a restaging chest CT after this cycle.  Mr. Lona will return for an office visit in 2 weeks.  We will repeat his pulse in the chemotherapy room today.  The tachycardia may be related to drinking a "[facility] "prior to arriving at the cancer center.  Betsy Coder, MD  01/16/2020  8:51 AM

## 2020-01-18 ENCOUNTER — Other Ambulatory Visit: Payer: Self-pay

## 2020-01-18 ENCOUNTER — Other Ambulatory Visit: Payer: Self-pay | Admitting: Oncology

## 2020-01-18 ENCOUNTER — Inpatient Hospital Stay: Payer: Medicaid Other

## 2020-01-18 VITALS — BP 156/104 | HR 118 | Temp 97.8°F | Resp 17

## 2020-01-18 DIAGNOSIS — Z5112 Encounter for antineoplastic immunotherapy: Secondary | ICD-10-CM | POA: Diagnosis not present

## 2020-01-18 MED ORDER — SODIUM CHLORIDE 0.9% FLUSH
10.0000 mL | INTRAVENOUS | Status: DC | PRN
  Administered 2020-01-18: 10 mL via INTRAVENOUS
  Filled 2020-01-18: qty 10

## 2020-01-18 MED ORDER — HEPARIN SOD (PORK) LOCK FLUSH 100 UNIT/ML IV SOLN
500.0000 [IU] | Freq: Once | INTRAVENOUS | Status: AC | PRN
  Administered 2020-01-18: 500 [IU] via INTRAVENOUS
  Filled 2020-01-18: qty 5

## 2020-01-22 ENCOUNTER — Encounter: Payer: Self-pay | Admitting: *Deleted

## 2020-01-22 NOTE — Progress Notes (Signed)
Received notification that override limit on olanzapine has been exceeded. Forwarded information to [facility], Therapist, sports to work on authorization required to continue medication.

## 2020-01-28 ENCOUNTER — Other Ambulatory Visit: Payer: Self-pay | Admitting: Oncology

## 2020-01-29 ENCOUNTER — Ambulatory Visit (HOSPITAL_COMMUNITY)
Admission: RE | Admit: 2020-01-29 | Discharge: 2020-01-29 | Disposition: A | Discharge status: Home / Self Care | Payer: Medicaid Other | Source: Ambulatory Visit | Attending: Oncology | Admitting: Oncology

## 2020-01-29 ENCOUNTER — Other Ambulatory Visit: Payer: Self-pay

## 2020-01-29 DIAGNOSIS — C187 Malignant neoplasm of sigmoid colon: Secondary | ICD-10-CM | POA: Insufficient documentation

## 2020-01-30 ENCOUNTER — Encounter: Payer: Self-pay | Admitting: Nurse Practitioner

## 2020-01-30 ENCOUNTER — Inpatient Hospital Stay: Payer: Medicaid Other

## 2020-01-30 ENCOUNTER — Other Ambulatory Visit: Payer: Self-pay

## 2020-01-30 ENCOUNTER — Inpatient Hospital Stay (HOSPITAL_BASED_OUTPATIENT_CLINIC_OR_DEPARTMENT_OTHER): Payer: Medicaid Other | Admitting: Nurse Practitioner

## 2020-01-30 VITALS — BP 160/90 | HR 96 | Temp 98.6°F | Resp 18 | Ht 70.0 in

## 2020-01-30 DIAGNOSIS — C187 Malignant neoplasm of sigmoid colon: Secondary | ICD-10-CM

## 2020-01-30 DIAGNOSIS — C189 Malignant neoplasm of colon, unspecified: Secondary | ICD-10-CM

## 2020-01-30 DIAGNOSIS — Z5112 Encounter for antineoplastic immunotherapy: Secondary | ICD-10-CM | POA: Diagnosis not present

## 2020-01-30 DIAGNOSIS — C787 Secondary malignant neoplasm of liver and intrahepatic bile duct: Secondary | ICD-10-CM

## 2020-01-30 DIAGNOSIS — Z7189 Other specified counseling: Secondary | ICD-10-CM

## 2020-01-30 DIAGNOSIS — Z95828 Presence of other vascular implants and grafts: Secondary | ICD-10-CM

## 2020-01-30 LAB — CMP (CANCER CENTER ONLY)
ALT: 64 U/L — ABNORMAL HIGH (ref 0–44)
AST: 53 U/L — ABNORMAL HIGH (ref 15–41)
Albumin: 3.6 g/dL (ref 3.5–5.0)
Alkaline Phosphatase: 123 U/L (ref 38–126)
Anion gap: 15 (ref 5–15)
BUN: 6 mg/dL (ref 6–20)
CO2: 22 mmol/L (ref 22–32)
Calcium: 9.6 mg/dL (ref 8.9–10.3)
Chloride: 102 mmol/L (ref 98–111)
Creatinine: 1.2 mg/dL (ref 0.61–1.24)
GFR, Est AFR Am: >60 mL/min (ref >60)
GFR, Estimated: >60 mL/min (ref >60)
Glucose, Bld: 271 mg/dL — ABNORMAL HIGH (ref 70–99)
Potassium: 3.7 mmol/L (ref 3.5–5.1)
Sodium: 139 mmol/L (ref 135–145)
Total Bilirubin: 0.5 mg/dL (ref 0.3–1.2)
Total Protein: 7.7 g/dL (ref 6.5–8.1)

## 2020-01-30 LAB — CBC WITH DIFFERENTIAL (CANCER CENTER ONLY)
Abs Immature Granulocytes: 0.01 10*3/uL (ref 0.00–0.07)
Basophils Absolute: 0 10*3/uL (ref 0.0–0.1)
Basophils Relative: 0 %
Eosinophils Absolute: 0.2 10*3/uL (ref 0.0–0.5)
Eosinophils Relative: 3 %
HCT: 43.6 % (ref 39.0–52.0)
Hemoglobin: 13.6 g/dL (ref 13.0–17.0)
Immature Granulocytes: 0 %
Lymphocytes Relative: 39 %
Lymphs Abs: 2.5 10*3/uL (ref 0.7–4.0)
MCH: 27.3 pg (ref 26.0–34.0)
MCHC: 31.2 g/dL (ref 30.0–36.0)
MCV: 87.4 fL (ref 80.0–100.0)
Monocytes Absolute: 0.5 10*3/uL (ref 0.1–1.0)
Monocytes Relative: 8 %
Neutro Abs: 3.1 10*3/uL (ref 1.7–7.7)
Neutrophils Relative %: 50 %
Platelet Count: 127 10*3/uL — ABNORMAL LOW (ref 150–400)
RBC: 4.99 MIL/uL (ref 4.22–5.81)
RDW: 16.1 % — ABNORMAL HIGH (ref 11.5–15.5)
WBC Count: 6.3 10*3/uL (ref 4.0–10.5)
nRBC: 0 % (ref 0.0–0.2)

## 2020-01-30 LAB — CEA (IN HOUSE-CHCC): CEA (CHCC-In House): 6.27 ng/mL — ABNORMAL HIGH (ref 0.00–5.00)

## 2020-01-30 LAB — TOTAL PROTEIN, URINE DIPSTICK: Protein, ur: NEGATIVE mg/dL

## 2020-01-30 MED ORDER — DEXAMETHASONE SODIUM PHOSPHATE 10 MG/ML IJ SOLN
INTRAMUSCULAR | Status: AC
  Filled 2020-01-30: qty 1

## 2020-01-30 MED ORDER — SODIUM CHLORIDE 0.9 % IV SOLN
150.0000 mg | Freq: Once | INTRAVENOUS | Status: AC
  Administered 2020-01-30: 150 mg via INTRAVENOUS
  Filled 2020-01-30: qty 150

## 2020-01-30 MED ORDER — PALONOSETRON HCL INJECTION 0.25 MG/5ML
INTRAVENOUS | Status: AC
  Filled 2020-01-30: qty 5

## 2020-01-30 MED ORDER — SODIUM CHLORIDE 0.9% FLUSH
10.0000 mL | INTRAVENOUS | Status: DC | PRN
  Administered 2020-01-30: 10 mL via INTRAVENOUS
  Filled 2020-01-30: qty 10

## 2020-01-30 MED ORDER — SODIUM CHLORIDE 0.9 % IV SOLN
Freq: Once | INTRAVENOUS | Status: AC
  Filled 2020-01-30: qty 250

## 2020-01-30 MED ORDER — LORAZEPAM 0.5 MG PO TABS: 0.5000 mg | ORAL_TABLET | Freq: Three times a day (TID) | ORAL | 0 refills | Status: DC | PRN

## 2020-01-30 MED ORDER — DEXAMETHASONE SODIUM PHOSPHATE 10 MG/ML IJ SOLN
5.0000 mg | Freq: Once | INTRAMUSCULAR | Status: AC
  Administered 2020-01-30: 5 mg via INTRAVENOUS

## 2020-01-30 MED ORDER — ATROPINE SULFATE 0.4 MG/ML IJ SOLN
0.4000 mg | Freq: Once | INTRAMUSCULAR | Status: AC
  Administered 2020-01-30: 0.4 mg via INTRAVENOUS

## 2020-01-30 MED ORDER — PALONOSETRON HCL INJECTION 0.25 MG/5ML
0.2500 mg | Freq: Once | INTRAVENOUS | Status: AC
  Administered 2020-01-30: 0.25 mg via INTRAVENOUS

## 2020-01-30 MED ORDER — SODIUM CHLORIDE 0.9 % IV SOLN
180.0000 mg/m2 | Freq: Once | INTRAVENOUS | Status: AC
  Administered 2020-01-30: 420 mg via INTRAVENOUS
  Filled 2020-01-30: qty 21

## 2020-01-30 MED ORDER — SODIUM CHLORIDE 0.9 % IV SOLN
2000.0000 mg/m2 | INTRAVENOUS | Status: DC
  Administered 2020-01-30: 4550 mg via INTRAVENOUS
  Filled 2020-01-30: qty 91

## 2020-01-30 MED ORDER — ATROPINE SULFATE 0.4 MG/ML IJ SOLN
INTRAMUSCULAR | Status: AC
  Filled 2020-01-30: qty 1

## 2020-01-30 MED ORDER — FLUOROURACIL CHEMO INJECTION 2.5 GM/50ML
400.0000 mg/m2 | Freq: Once | INTRAVENOUS | Status: AC
  Administered 2020-01-30: 900 mg via INTRAVENOUS
  Filled 2020-01-30: qty 18

## 2020-01-30 MED ORDER — SODIUM CHLORIDE 0.9 % IV SOLN: 5.0000 mg/kg | Freq: Once | INTRAVENOUS | Status: DC

## 2020-01-30 MED ORDER — SODIUM CHLORIDE 0.9 % IV SOLN
7.5000 mg/kg | Freq: Once | INTRAVENOUS | Status: AC
  Administered 2020-01-30: 800 mg via INTRAVENOUS
  Filled 2020-01-30: qty 32

## 2020-01-30 MED ORDER — SODIUM CHLORIDE 0.9 % IV SOLN
400.0000 mg/m2 | Freq: Once | INTRAVENOUS | Status: AC
  Administered 2020-01-30: 912 mg via INTRAVENOUS
  Filled 2020-01-30: qty 45.6

## 2020-01-30 NOTE — Progress Notes (Addendum)
[facility] OFFICE PROGRESS NOTE   Diagnosis: Colon cancer  INTERVAL HISTORY:   Mr. Weedman returns as scheduled.  He completed cycle 10 FOLFIRI/bevacizumab 01/16/2020.  He continues to experience anticipatory nausea.  No mouth sores.  No diarrhea.  He denies bleeding.  No shortness of breath or chest pain.  No leg swelling or calf pain.  Objective:  Vital signs in last 24 hours:  Blood pressure (!) 160/90, pulse 96, temperature 98.6 F (37 C), temperature source Tympanic, resp. rate 18, height $RemoveBe'5\' 10"'AIkPgzozd$  (1.778 m), SpO2 100 %.    HEENT: No thrush or ulcers. Resp: Lungs clear bilaterally. Cardio: Regular rate and rhythm. GI: Abdomen soft and nontender.  No hepatomegaly. Vascular: No leg edema.  Calves soft and nontender. Port-A-Cath without erythema.  Lab Results:  Lab Results  Component Value Date   WBC 6.3 01/30/2020   HGB 13.6 01/30/2020   HCT 43.6 01/30/2020   MCV 87.4 01/30/2020   PLT 127 (L) 01/30/2020   NEUTROABS 3.1 01/30/2020    Imaging:  CT CHEST WO CONTRAST  Result Date: 01/29/2020 CLINICAL DATA:  Colorectal cancer with ongoing chemotherapy. Restaging. EXAM: CT CHEST WITHOUT CONTRAST TECHNIQUE: Multidetector CT imaging of the chest was performed following the standard protocol without IV contrast. COMPARISON:  11/22/2019 chest CT. FINDINGS: Cardiovascular: Normal heart size. No significant pericardial effusion/thickening. Right internal jugular Port-A-Cath terminates in the middle third of the SVC. Atherosclerotic nonaneurysmal thoracic aorta. Normal caliber pulmonary arteries. Mediastinum/Nodes: No discrete thyroid nodules. Unremarkable esophagus. No pathologically enlarged axillary, mediastinal or hilar lymph nodes, noting limited sensitivity for the detection of hilar adenopathy on this noncontrast study. Lungs/Pleura: No pneumothorax. No pleural effusion. No acute consolidative airspace disease. Multiple (at least 7) solid pulmonary nodules scattered  in both lungs, unchanged in number and not significantly changed in size. Representative 3.2 cm posterior lingular nodule (series 5/image 77), previously 3.1 cm. Representative medial basilar right lower lobe 2.5 cm nodule (series 5/image 110), previously 2.6 cm. Representative right middle lobe 2.9 cm nodule (series 5/image 76), previously 2.8 cm. Representative 1.6 cm posterior right lower lobe nodule (series 5/image 90), previously 1.5 cm. No new significant pulmonary nodules. Upper abdomen: Diffuse hepatic steatosis. Nonobstructing 3 mm upper left renal stone. Musculoskeletal: No aggressive appearing focal osseous lesions. Mild thoracic spondylosis. IMPRESSION: 1. Stable bilateral pulmonary metastases. No new or progressive metastatic disease in the chest. 2. Diffuse hepatic steatosis. 3. Nonobstructing left nephrolithiasis. 4. Aortic Atherosclerosis (ICD10-I70.0). Electronically Signed   By: Ilona Sorrel M.D.   On: 01/29/2020 12:41    Medications: I have reviewed the patient's current medications.  Assessment/Plan: 1. 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 CTschest, abdomen, and pelvis on 12/11/2015-indeterminate tiny pulmonary nodules, multiple liver metastases, small nodes in the sigmoid mesocolon  3. Laparoscopic sigmoid colectomy, liver biopsy, Port-A-Cath placement 01/14/2016  4. Pathology sigmoid colon resection-colonic adenocarcinoma, 5.3 cm extending into pericolonic connective tissue, positive lymph-vascular invasion, positive perineural invasion, negative margins, metastatic carcinoma in9 of 28 lymph nodes  5. Pathology liver biopsy-metastatic colorectal adenocarcinoma  6. MSI stable; mismatch repair protein normal  7. 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 Cycle 1 FOLFOX 02/02/2016  9. Cycle 2 FOLFOX 02/15/2016  10. Cycle 3 FOLFOX 02/29/2016  11. Cycle 4 FOLFOX 03/14/2016   12. Cycle 5 FOLFOX 03/28/2016  13. Cycle 6 FOLFOX 04/11/2016 (oxaliplatin held secondary to thrombocytopenia)  14. 04/21/2016 restaging CTs-difficulty evaluating liver lesions due to  hepatic steatosis. Stable right adrenal nodule. No adenopathy or local recurrence near the rectosigmoid anastomotic site.  15. Cycle 7 FOLFOX 04/25/2016  16. MRI liver 05/02/2016-partial improvement in hepatic metastases  17. Cycle 8 FOLFOX 05/10/2016  18. Cycle 9 FOLFOX 05/23/2016 (oxaliplatin held due to thrombocytopenia)  19. Cycle 10 FOLFOX 06/06/2016 (oxaliplatin dose reduced due to thrombocytopenia)  20. Cycle 11 FOLFOX 06/27/2016 (oxaliplatin held due to neuropathy)  21. Cycle 12 FOLFOX 07/11/2016 (oxaliplatin held) 22. Initiation of maintenance Xeloda 7 days on/7 days off 07/27/2016 23. MRI liver 11/18/2016-decrease in hepatic metastatic disease. No new or progressive disease identified within the abdomen. 24. Continuation of Xeloda 7 days on/7 days off 25. MRI liver 04/27/2017-previous liver lesions not identified, no new lesions, no lymphadenopathy 26. Xeloda continued 7 days on/7 days off 27. MRI liver 12/04/2017 -no evidence of metastatic disease, hepatic steatosis 28. Xeloda continued 7 days on/7 days off 29. MRI liver 07/15/2018-no evidence of metastatic disease. Stable severe hepatic steatosis. 30. Xeloda continued 7 days on/7 days off 31. MRI liver 03/16/2019-hepatic steatosis, no liver mass, focal area of intrahepatic biliary dilatation in segments 2 and 3 of the left lobe-increased 32. Xeloda continued 7 days on/7 days off 33. MRI abdomen 08/19/2019-no findings to suggest liver metastases. Bilateral lung nodules measuring up to 2.8 cm, progressive and more conspicuous than on previous exam 34. CT chest 08/29/2019-multiple pulmonary metastases, new from 04/21/2016 35. Cycle 1 FOLFIRI/bevacizumab 09/09/2019 36. Cycle 2 FOLFIRI/bevacizumab 09/26/2019 37. Cycle 3 FOLFIRI/bevacizumab  10/10/2019 38. Cycle 4 FOLFIRI/bevacizumab 10/24/2019 39. Cycle 5 FOLFIRI/bevacizumab 11/07/2019 40. CT chest 11/14/2019-decreased size of lung nodules, no new lesions, hepatic steatosis 41. Cycle 6 FOLFIRI/bevacizumab 11/21/2019 42. Cycle 7 FOLFIRI/bevacizumab 12/05/2019 43. Cycle 8 FOLFIRI/bevacizumab 12/19/2019 44. Cycle 9 FOLFIRI/bevacizumab 01/02/2020 45. Cycle 10 FOLFIRI/bevacizumab 01/16/2020 46. CT chest 01/29/2020-stable bilateral pulmonary metastases.  No new or progressive metastatic disease in the chest.  2. Rectal bleeding and constipation secondary to #1  3. History of peripheral vascular disease, status post left lower extremity vascular bypass surgery in April 2017  4. History of nephrolithiasis  5. History of Graves' disease treated with radioactive iodine  6. Anxiety/depression  7. Hypertension  8. Hospitalization 01/19/2016 with wound dehiscence status post secondary suture closure of abdominal wall  9. 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 Hyperglycemia 06/20/2016-diagnosed with diabetes, maintained on insulin  11.Positive COVID test 12/13/2018  12. Hospitalized with seizure activity/DKA. Now on Keppra, insulin adjusted. 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    Disposition: Mr. Kissner appears stable.  He has completed 10 cycles of FOLFIRI/Avastin.  Recent restaging chest CT shows stable disease in the lungs.  Dr. Truett Perna reviewed options with Mr. Tropea and his wife at today's appointment to include a treatment break, continuation of FOLFIRI/Avastin every 2 weeks, continuation of FOLFIRI/Avastin every 3 weeks.  He would like to adjust the treatment schedule to every 3 weeks.  Plan to proceed with treatment today as scheduled, subsequent treatments at a 3-week  interval.  We reviewed the CBC from today.  Counts adequate to proceed with treatment.  He has stable mild thrombocytopenia and will contact the office with any bleeding.  He will return for lab, follow-up, FOLFIRI/Avastin in 3 weeks.  He will contact the office in the interim with any problems.  Patient seen with Dr. Truett Perna.      Lonna Cobb ANP/GNP-BC   01/30/2020  10:13 AM  This was a shared visit with Ned Card.  Mr. Ledyard continues to tolerate the FOLFIRI/Avastin well.  The restaging CT shows stable disease.  I reviewed the CT images.  We discussed treatment options with Mr. Alfrey.  He agrees to continue FOLFIRI/Avastin on a 3-week schedule.  Julieanne Manson, MD

## 2020-02-01 ENCOUNTER — Other Ambulatory Visit: Payer: Self-pay

## 2020-02-01 ENCOUNTER — Inpatient Hospital Stay: Payer: Medicaid Other

## 2020-02-01 VITALS — HR 100 | Temp 97.7°F | Resp 20 | Ht 70.0 in

## 2020-02-01 DIAGNOSIS — C187 Malignant neoplasm of sigmoid colon: Secondary | ICD-10-CM

## 2020-02-01 DIAGNOSIS — Z5112 Encounter for antineoplastic immunotherapy: Secondary | ICD-10-CM | POA: Diagnosis not present

## 2020-02-01 MED ORDER — SODIUM CHLORIDE 0.9% FLUSH
10.0000 mL | INTRAVENOUS | Status: DC | PRN
  Administered 2020-02-01: 10 mL
  Filled 2020-02-01: qty 10

## 2020-02-01 MED ORDER — HEPARIN SOD (PORK) LOCK FLUSH 100 UNIT/ML IV SOLN
500.0000 [IU] | Freq: Once | INTRAVENOUS | Status: AC | PRN
  Administered 2020-02-01: 500 [IU]
  Filled 2020-02-01: qty 5

## 2020-02-01 NOTE — Progress Notes (Signed)
Pt arrived for pump stop appointment. Had called earlier stating that his pump was not infusing properly at home. Pt contracted both on call nurse and pump problem number located on the pump though the night. 87.42ml infused and 62.3 ml remaining in pump. Dr. Jana Hakim (on call physician) notified. Order given to D/C pump and have patient call Dr. Victorino Sparrow on Monday.

## 2020-02-03 ENCOUNTER — Encounter: Payer: Self-pay | Admitting: *Deleted

## 2020-02-03 NOTE — Progress Notes (Signed)
Dr. Benay Spice made aware that ~ 97 ml of 5 FU infusion was not delivered due to pump malfunction.

## 2020-02-13 ENCOUNTER — Other Ambulatory Visit: Payer: Medicaid Other

## 2020-02-13 ENCOUNTER — Ambulatory Visit: Payer: Medicaid Other

## 2020-02-13 ENCOUNTER — Ambulatory Visit: Payer: Medicaid Other | Admitting: Oncology

## 2020-02-19 ENCOUNTER — Telehealth: Payer: Self-pay | Admitting: Oncology

## 2020-02-19 ENCOUNTER — Other Ambulatory Visit: Payer: Self-pay

## 2020-02-19 ENCOUNTER — Inpatient Hospital Stay: Payer: Medicaid Other

## 2020-02-19 ENCOUNTER — Inpatient Hospital Stay: Payer: Medicaid Other | Attending: Oncology | Admitting: Oncology

## 2020-02-19 VITALS — BP 140/90 | HR 89 | Temp 98.1°F | Resp 17 | Ht 70.0 in | Wt 234.3 lb

## 2020-02-19 DIAGNOSIS — C189 Malignant neoplasm of colon, unspecified: Secondary | ICD-10-CM

## 2020-02-19 DIAGNOSIS — C787 Secondary malignant neoplasm of liver and intrahepatic bile duct: Secondary | ICD-10-CM | POA: Diagnosis not present

## 2020-02-19 DIAGNOSIS — C7802 Secondary malignant neoplasm of left lung: Secondary | ICD-10-CM | POA: Insufficient documentation

## 2020-02-19 DIAGNOSIS — Z5111 Encounter for antineoplastic chemotherapy: Secondary | ICD-10-CM | POA: Insufficient documentation

## 2020-02-19 DIAGNOSIS — Z23 Encounter for immunization: Secondary | ICD-10-CM | POA: Diagnosis not present

## 2020-02-19 DIAGNOSIS — Z5112 Encounter for antineoplastic immunotherapy: Secondary | ICD-10-CM | POA: Insufficient documentation

## 2020-02-19 DIAGNOSIS — C7801 Secondary malignant neoplasm of right lung: Secondary | ICD-10-CM | POA: Insufficient documentation

## 2020-02-19 DIAGNOSIS — C187 Malignant neoplasm of sigmoid colon: Secondary | ICD-10-CM

## 2020-02-19 LAB — CBC WITH DIFFERENTIAL (CANCER CENTER ONLY)
Abs Immature Granulocytes: 0.02 10*3/uL (ref 0.00–0.07)
Basophils Absolute: 0 10*3/uL (ref 0.0–0.1)
Basophils Relative: 0 %
Eosinophils Absolute: 0.3 10*3/uL (ref 0.0–0.5)
Eosinophils Relative: 4 %
HCT: 45.7 % (ref 39.0–52.0)
Hemoglobin: 14 g/dL (ref 13.0–17.0)
Immature Granulocytes: 0 %
Lymphocytes Relative: 47 %
Lymphs Abs: 2.7 10*3/uL (ref 0.7–4.0)
MCH: 27.2 pg (ref 26.0–34.0)
MCHC: 30.6 g/dL (ref 30.0–36.0)
MCV: 88.9 fL (ref 80.0–100.0)
Monocytes Absolute: 0.8 10*3/uL (ref 0.1–1.0)
Monocytes Relative: 13 %
Neutro Abs: 2.1 10*3/uL (ref 1.7–7.7)
Neutrophils Relative %: 36 %
Platelet Count: 175 10*3/uL (ref 150–400)
RBC: 5.14 MIL/uL (ref 4.22–5.81)
RDW: 15.9 % — ABNORMAL HIGH (ref 11.5–15.5)
WBC Count: 5.9 10*3/uL (ref 4.0–10.5)
nRBC: 0 % (ref 0.0–0.2)

## 2020-02-19 LAB — CMP (CANCER CENTER ONLY)
ALT: 57 U/L — ABNORMAL HIGH (ref 0–44)
AST: 56 U/L — ABNORMAL HIGH (ref 15–41)
Albumin: 3.6 g/dL (ref 3.5–5.0)
Alkaline Phosphatase: 107 U/L (ref 38–126)
Anion gap: 12 (ref 5–15)
BUN: 8 mg/dL (ref 6–20)
CO2: 24 mmol/L (ref 22–32)
Calcium: 9.6 mg/dL (ref 8.9–10.3)
Chloride: 104 mmol/L (ref 98–111)
Creatinine: 1.12 mg/dL (ref 0.61–1.24)
GFR, Estimated: >60 mL/min (ref >60)
Glucose, Bld: 252 mg/dL — ABNORMAL HIGH (ref 70–99)
Potassium: 3.8 mmol/L (ref 3.5–5.1)
Sodium: 140 mmol/L (ref 135–145)
Total Bilirubin: 0.4 mg/dL (ref 0.3–1.2)
Total Protein: 7.8 g/dL (ref 6.5–8.1)

## 2020-02-19 LAB — CEA (IN HOUSE-CHCC): CEA (CHCC-In House): 5.78 ng/mL — ABNORMAL HIGH (ref 0.00–5.00)

## 2020-02-19 MED ORDER — ATROPINE SULFATE 1 MG/ML IJ SOLN
INTRAMUSCULAR | Status: AC
  Filled 2020-02-19: qty 1

## 2020-02-19 MED ORDER — DEXAMETHASONE SODIUM PHOSPHATE 10 MG/ML IJ SOLN
INTRAMUSCULAR | Status: AC
  Filled 2020-02-19: qty 1

## 2020-02-19 MED ORDER — SODIUM CHLORIDE 0.9 % IV SOLN
7.5000 mg/kg | Freq: Once | INTRAVENOUS | Status: AC
  Administered 2020-02-19: 800 mg via INTRAVENOUS
  Filled 2020-02-19: qty 32

## 2020-02-19 MED ORDER — PALONOSETRON HCL INJECTION 0.25 MG/5ML
INTRAVENOUS | Status: AC
  Filled 2020-02-19: qty 5

## 2020-02-19 MED ORDER — SODIUM CHLORIDE 0.9 % IV SOLN
Freq: Once | INTRAVENOUS | Status: AC
  Filled 2020-02-19: qty 250

## 2020-02-19 MED ORDER — INFLUENZA VAC SPLIT QUAD 0.5 ML IM SUSY
0.5000 mL | PREFILLED_SYRINGE | Freq: Once | INTRAMUSCULAR | Status: AC
  Administered 2020-02-19: 0.5 mL via INTRAMUSCULAR

## 2020-02-19 MED ORDER — SODIUM CHLORIDE 0.9 % IV SOLN
400.0000 mg/m2 | Freq: Once | INTRAVENOUS | Status: AC
  Administered 2020-02-19: 912 mg via INTRAVENOUS
  Filled 2020-02-19: qty 45.6

## 2020-02-19 MED ORDER — ATROPINE SULFATE 0.4 MG/ML IJ SOLN
0.4000 mg | Freq: Once | INTRAMUSCULAR | Status: AC
  Administered 2020-02-19: 0.4 mg via INTRAVENOUS

## 2020-02-19 MED ORDER — SODIUM CHLORIDE 0.9 % IV SOLN
150.0000 mg | Freq: Once | INTRAVENOUS | Status: AC
  Administered 2020-02-19: 150 mg via INTRAVENOUS
  Filled 2020-02-19: qty 150

## 2020-02-19 MED ORDER — FLUOROURACIL CHEMO INJECTION 2.5 GM/50ML
400.0000 mg/m2 | Freq: Once | INTRAVENOUS | Status: AC
  Administered 2020-02-19: 900 mg via INTRAVENOUS
  Filled 2020-02-19: qty 18

## 2020-02-19 MED ORDER — PALONOSETRON HCL INJECTION 0.25 MG/5ML
0.2500 mg | Freq: Once | INTRAVENOUS | Status: AC
  Administered 2020-02-19: 0.25 mg via INTRAVENOUS

## 2020-02-19 MED ORDER — SODIUM CHLORIDE 0.9 % IV SOLN
180.0000 mg/m2 | Freq: Once | INTRAVENOUS | Status: AC
  Administered 2020-02-19: 420 mg via INTRAVENOUS
  Filled 2020-02-19: qty 15

## 2020-02-19 MED ORDER — DEXAMETHASONE SODIUM PHOSPHATE 10 MG/ML IJ SOLN
5.0000 mg | Freq: Once | INTRAMUSCULAR | Status: AC
  Administered 2020-02-19: 5 mg via INTRAVENOUS

## 2020-02-19 MED ORDER — INFLUENZA VAC SPLIT QUAD 0.5 ML IM SUSY
PREFILLED_SYRINGE | INTRAMUSCULAR | Status: AC
  Filled 2020-02-19: qty 0.5

## 2020-02-19 MED ORDER — ATROPINE SULFATE 0.4 MG/ML IJ SOLN
INTRAMUSCULAR | Status: AC
  Filled 2020-02-19: qty 1

## 2020-02-19 MED ORDER — SODIUM CHLORIDE 0.9 % IV SOLN
2000.0000 mg/m2 | INTRAVENOUS | Status: DC
  Administered 2020-02-19: 4550 mg via INTRAVENOUS
  Filled 2020-02-19: qty 91

## 2020-02-19 NOTE — Patient Instructions (Addendum)
[facility] Discharge Instructions for Patients Receiving Chemotherapy  Today you received the following chemotherapy agents: bevacizumab/irinotecan/leucovorin/fluorouracil.  To help prevent nausea and vomiting after your treatment, we encourage you to take your nausea medication as directed.   If you develop nausea and vomiting that is not controlled by your nausea medication, call the clinic.   BELOW ARE SYMPTOMS THAT SHOULD BE REPORTED IMMEDIATELY:  *FEVER GREATER THAN 100.5 F  *CHILLS WITH OR WITHOUT FEVER  NAUSEA AND VOMITING THAT IS NOT CONTROLLED WITH YOUR NAUSEA MEDICATION  *UNUSUAL SHORTNESS OF BREATH  *UNUSUAL BRUISING OR BLEEDING  TENDERNESS IN MOUTH AND THROAT WITH OR WITHOUT PRESENCE OF ULCERS  *URINARY PROBLEMS  *BOWEL PROBLEMS  UNUSUAL RASH Items with * indicate a potential emergency and should be followed up as soon as possible.  Feel free to call the clinic should you have any questions or concerns. The clinic phone number is [phone].  Please show the [facility] at check-in to the Emergency Department and triage nurse.  Influenza Virus Vaccine injection What is this medicine? INFLUENZA VIRUS VACCINE (in floo EN zuh VAHY ruhs vak SEEN) helps to reduce the risk of getting influenza also known as the flu. The vaccine only helps protect you against some strains of the flu. This medicine may be used for other purposes; ask your health care provider or pharmacist if you have questions. COMMON BRAND NAME(S): Afluria, Afluria Quadrivalent, Agriflu, Alfuria, FLUAD, Fluarix, Fluarix Quadrivalent, Flublok, Flublok Quadrivalent, FLUCELVAX, FLUCELVAX Quadrivalent, Flulaval, Flulaval Quadrivalent, Fluvirin, Fluzone, Fluzone High-Dose, Fluzone Intradermal, Fluzone Quadrivalent What should I tell my health care provider before I take this medicine? They need to know if you have any of these conditions:  bleeding disorder like hemophilia  fever  or infection  Guillain-Barre syndrome or other neurological problems  immune system problems  infection with the human immunodeficiency virus (HIV) or AIDS  low blood platelet counts  multiple sclerosis  an unusual or allergic reaction to influenza virus vaccine, latex, other medicines, foods, dyes, or preservatives. Different brands of vaccines contain different allergens. Some may contain latex or eggs. Talk to your doctor about your allergies to make sure that you get the right vaccine.  pregnant or trying to get pregnant  breast-feeding How should I use this medicine? This vaccine is for injection into a muscle or under the skin. It is given by a health care professional. A copy of Vaccine Information Statements will be given before each vaccination. Read this sheet carefully each time. The sheet may change frequently. Talk to your healthcare provider to see which vaccines are right for you. Some vaccines should not be used in all age groups. Overdosage: If you think you have taken too much of this medicine contact a poison control center or emergency room at once. NOTE: This medicine is only for you. Do not share this medicine with others. What if I miss a dose? This does not apply. What may interact with this medicine?  chemotherapy or radiation therapy  medicines that lower your immune system like etanercept, anakinra, infliximab, and adalimumab  medicines that treat or prevent blood clots like warfarin  phenytoin  steroid medicines like prednisone or cortisone  theophylline  vaccines This list may not describe all possible interactions. Give your health care provider a list of all the medicines, herbs, non-prescription drugs, or dietary supplements you use. Also tell them if you smoke, drink alcohol, or use illegal drugs. Some items may interact with your medicine. What should  I watch for while using this medicine? Report any side effects that do not go away within 3  days to your doctor or health care professional. Call your health care provider if any unusual symptoms occur within 6 weeks of receiving this vaccine. You may still catch the flu, but the illness is not usually as bad. You cannot get the flu from the vaccine. The vaccine will not protect against colds or other illnesses that may cause fever. The vaccine is needed every year. What side effects may I notice from receiving this medicine? Side effects that you should report to your doctor or health care professional as soon as possible:  allergic reactions like skin rash, itching or hives, swelling of the face, lips, or tongue Side effects that usually do not require medical attention (report to your doctor or health care professional if they continue or are bothersome):  fever  headache  muscle aches and pains  pain, tenderness, redness, or swelling at the injection site  tiredness This list may not describe all possible side effects. Call your doctor for medical advice about side effects. You may report side effects to FDA at 1-800-FDA-1088. Where should I keep my medicine? The vaccine will be given by a health care professional in a clinic, pharmacy, doctor's office, or other health care setting. You will not be given vaccine doses to store at home. NOTE: This sheet is a summary. It may not cover all possible information. If you have questions about this medicine, talk to your doctor, pharmacist, or health care provider.  2020 Elsevier/Gold Standard (2018-03-27 08:45:43)

## 2020-02-19 NOTE — Patient Instructions (Signed)

## 2020-02-19 NOTE — Telephone Encounter (Signed)
Scheduled per 10/6 los. No avs or calendar needed to be printed.

## 2020-02-19 NOTE — Progress Notes (Signed)
[facility] OFFICE PROGRESS NOTE   Diagnosis: Colon cancer  INTERVAL HISTORY:   [name] completed another cycle of FOLFIRI/Avastin on 01/30/2020.  No mouth sores, nausea, or diarrhea.  He reports his blood sugar has been under better control.  No new complaint.  The 5-FU pump malfunctioned and he missed part of the 5-FU infusion.  Objective:  Vital signs in last 24 hours:  Blood pressure 140/90, pulse 89, temperature 98.1 F (36.7 C), temperature source Tympanic, resp. rate 17, height $RemoveBe'5\' 10"'tDkAEfYeN$  (1.778 m), weight 234 lb 4.8 oz (106.3 kg), SpO2 100 %.    HEENT: No thrush or ulcers Resp: Lungs clear bilaterally Cardio: Regular rate and rhythm GI: No hepatosplenomegaly, nontender Vascular: No leg edema    Portacath/PICC-without erythema  Lab Results:  Lab Results  Component Value Date   WBC 5.9 02/19/2020   HGB 14.0 02/19/2020   HCT 45.7 02/19/2020   MCV 88.9 02/19/2020   PLT 175 02/19/2020   NEUTROABS 2.1 02/19/2020    CMP  Lab Results  Component Value Date   NA 140 02/19/2020   K 3.8 02/19/2020   CL 104 02/19/2020   CO2 24 02/19/2020   GLUCOSE 252 (H) 02/19/2020   BUN 8 02/19/2020   CREATININE 1.12 02/19/2020   CALCIUM 9.6 02/19/2020   PROT 7.8 02/19/2020   ALBUMIN 3.6 02/19/2020   AST 56 (H) 02/19/2020   ALT 57 (H) 02/19/2020   ALKPHOS 107 02/19/2020   BILITOT 0.4 02/19/2020   GFRNONAA >60 02/19/2020   GFRAA >60 01/30/2020    Lab Results  Component Value Date   CEA1 6.27 (H) 01/30/2020     Medications: I have reviewed the patient's current medications.   Assessment/Plan: 1. 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 CTschest, abdomen, and pelvis on 12/11/2015-indeterminate tiny pulmonary nodules, multiple liver metastases, small nodes in the sigmoid mesocolon  3. Laparoscopic sigmoid colectomy, liver biopsy, Port-A-Cath placement 01/14/2016   4. Pathology sigmoid colon resection-colonic adenocarcinoma, 5.3 cm extending into pericolonic connective tissue, positive lymph-vascular invasion, positive perineural invasion, negative margins, metastatic carcinoma in9 of 28 lymph nodes  5. Pathology liver biopsy-metastatic colorectal adenocarcinoma  6. MSI stable; mismatch repair protein normal  7. 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 Cycle 1 FOLFOX 02/02/2016  9. Cycle 2 FOLFOX 02/15/2016  10. Cycle 3 FOLFOX 02/29/2016  11. Cycle 4 FOLFOX 03/14/2016  12. Cycle 5 FOLFOX 03/28/2016  13. Cycle 6 FOLFOX 04/11/2016 (oxaliplatin held secondary to thrombocytopenia)  14. 04/21/2016 restaging CTs-difficulty evaluating liver lesions due to hepatic steatosis. Stable right adrenal nodule. No adenopathy or local recurrence near the rectosigmoid anastomotic site.  15. Cycle 7 FOLFOX 04/25/2016  16. MRI liver 05/02/2016-partial improvement in hepatic metastases  17. Cycle 8 FOLFOX 05/10/2016  18. Cycle 9 FOLFOX 05/23/2016 (oxaliplatin held due to thrombocytopenia)  19. Cycle 10 FOLFOX 06/06/2016 (oxaliplatin dose reduced due to thrombocytopenia)  20. Cycle 11 FOLFOX 06/27/2016 (oxaliplatin held due to neuropathy)  21. Cycle 12 FOLFOX 07/11/2016 (oxaliplatin held) 22. Initiation of maintenance Xeloda 7 days on/7 days off 07/27/2016 23. MRI liver 11/18/2016-decrease in hepatic metastatic disease. No new or progressive disease identified within the abdomen. 24. Continuation of Xeloda 7 days on/7 days off 25. MRI liver 04/27/2017-previous liver lesions not identified, no new lesions, no lymphadenopathy 26. Xeloda continued 7 days on/7 days off 27. MRI liver 12/04/2017 -no evidence of metastatic disease, hepatic steatosis 28. Xeloda continued 7  days on/7 days off 29. MRI liver 07/15/2018-no evidence of metastatic disease. Stable severe hepatic steatosis. 30. Xeloda continued 7 days on/7 days off 31. MRI liver 03/16/2019-hepatic  steatosis, no liver mass, focal area of intrahepatic biliary dilatation in segments 2 and 3 of the left lobe-increased 32. Xeloda continued 7 days on/7 days off 33. MRI abdomen 08/19/2019-no findings to suggest liver metastases. Bilateral lung nodules measuring up to 2.8 cm, progressive and more conspicuous than on previous exam 34. CT chest 08/29/2019-multiple pulmonary metastases, new from 04/21/2016 35. Cycle 1 FOLFIRI/bevacizumab 09/09/2019 36. Cycle 2 FOLFIRI/bevacizumab 09/26/2019 37. Cycle 3 FOLFIRI/bevacizumab 10/10/2019 38. Cycle 4 FOLFIRI/bevacizumab 10/24/2019 39. Cycle 5 FOLFIRI/bevacizumab 11/07/2019 40. CT chest 11/14/2019-decreased size of lung nodules, no new lesions, hepatic steatosis 41. Cycle 6 FOLFIRI/bevacizumab 11/21/2019 42. Cycle 7 FOLFIRI/bevacizumab 12/05/2019 43. Cycle 8 FOLFIRI/bevacizumab 12/19/2019 44. Cycle 9 FOLFIRI/bevacizumab 01/02/2020 45. Cycle 10 FOLFIRI/bevacizumab 01/16/2020 46. CT chest 01/29/2020-stable bilateral pulmonary metastases.  No new or progressive metastatic disease in the chest. 47. Cycle 11 FOLFIRI/bevacizumab 01/30/2020 48. Cycle 12 FOLFIRI/bevacizumab 02/19/2020  2. Rectal bleeding and constipation secondary to #1  3. History of peripheral vascular disease, status post left lower extremity vascular bypass surgery in April 2017  4. History of nephrolithiasis  5. History of Graves' disease treated with radioactive iodine  6. Anxiety/depression  7. Hypertension  8. Hospitalization 01/19/2016 with wound dehiscence status post secondary suture closure of abdominal wall  9. 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 Hyperglycemia 06/20/2016-diagnosed with diabetes, maintained on insulin  11.Positive COVID test 12/13/2018  12. Hospitalized with seizure activity/DKA. Now on Keppra, insulin adjusted. 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     Disposition: [name] appears stable.  He tolerated the last cycle of chemotherapy well.  He will complete another cycle of FOLFIRI/bevacizumab today.  He will return for an office visit and chemotherapy in 3 weeks.  He received an influenza vaccine today.  Betsy Coder, MD  02/19/2020  9:16 AM

## 2020-02-21 ENCOUNTER — Inpatient Hospital Stay: Payer: Medicaid Other

## 2020-02-21 ENCOUNTER — Other Ambulatory Visit: Payer: Self-pay

## 2020-02-21 VITALS — BP 147/104 | HR 96 | Temp 98.9°F | Resp 18

## 2020-02-21 DIAGNOSIS — C187 Malignant neoplasm of sigmoid colon: Secondary | ICD-10-CM

## 2020-02-21 DIAGNOSIS — Z5112 Encounter for antineoplastic immunotherapy: Secondary | ICD-10-CM | POA: Diagnosis not present

## 2020-02-21 MED ORDER — HEPARIN SOD (PORK) LOCK FLUSH 100 UNIT/ML IV SOLN
500.0000 [IU] | Freq: Once | INTRAVENOUS | Status: AC | PRN
  Administered 2020-02-21: 500 [IU]
  Filled 2020-02-21: qty 5

## 2020-02-21 MED ORDER — SODIUM CHLORIDE 0.9% FLUSH
10.0000 mL | INTRAVENOUS | Status: DC | PRN
  Administered 2020-02-21: 10 mL
  Filled 2020-02-21: qty 10

## 2020-02-21 NOTE — Patient Instructions (Signed)

## 2020-03-05 ENCOUNTER — Encounter: Payer: Self-pay | Admitting: Neurology

## 2020-03-05 ENCOUNTER — Ambulatory Visit (INDEPENDENT_AMBULATORY_CARE_PROVIDER_SITE_OTHER): Payer: Medicaid Other | Admitting: Neurology

## 2020-03-05 ENCOUNTER — Other Ambulatory Visit: Payer: Self-pay

## 2020-03-05 VITALS — BP 156/99 | HR 122 | Ht 70.0 in | Wt 232.6 lb

## 2020-03-05 DIAGNOSIS — G40009 Localization-related (focal) (partial) idiopathic epilepsy and epileptic syndromes with seizures of localized onset, not intractable, without status epilepticus: Secondary | ICD-10-CM | POA: Diagnosis not present

## 2020-03-05 MED ORDER — LEVETIRACETAM 500 MG PO TABS
500.0000 mg | ORAL_TABLET | Freq: Two times a day (BID) | ORAL | 3 refills | Status: DC
Start: 2020-03-05 — End: 2020-04-14

## 2020-03-05 NOTE — Patient Instructions (Signed)
1. Continue Keppra 500mg  BID for now  2. Schedule 24-hour EEG. Our office will call with results. If normal, we will plan to slowly get off the Keppra. Any change in symptoms, please call our office  3. Continue good control of sugar levels  4. As per [place] driving laws, no driving after a seizure until 6 months seizure-free  5. Follow-up in 4 months, call for any changes   Seizure Precautions: 1. If medication has been prescribed for you to prevent seizures, take it exactly as directed.  Do not stop taking the medicine without talking to your doctor first, even if you have not had a seizure in a long time.   2. Avoid activities in which a seizure would cause danger to yourself or to others.  Don't operate dangerous machinery, swim alone, or climb in high or dangerous places, such as on ladders, roofs, or girders.  Do not drive unless your doctor says you may.  3. If you have any warning that you may have a seizure, lay down in a safe place where you can't hurt yourself.    4.  No driving for 6 months from last seizure, as per [facility].   Please refer to the following link on the [place] website for more information: [email or web address]/[email or web address]/[email or web address]   5.  Maintain good sleep hygiene. Avoid alcohol.  6.  Contact your doctor if you have any problems that may be related to the medicine you are taking.  7.  Call 911 and bring the patient back to the ED if:        A.  The seizure lasts longer than 5 minutes.       B.  The patient doesn't awaken shortly after the seizure  C.  The patient has new problems such as difficulty seeing, speaking or moving  D.  The patient was injured during the seizure  E.  The patient has a temperature over 102 F (39C)  F.  The patient vomited and now is having trouble breathing

## 2020-03-05 NOTE — Progress Notes (Signed)
NEUROLOGY FOLLOW UP OFFICE NOTE  Jeff [name] [id number] September 13, 1965  HISTORY OF PRESENT ILLNESS: I had the pleasure of seeing Jeff [name] in follow-up in the neurology clinic on 03/05/2020. He is again accompanied by his wife who helps supplement the history today. The patient was last seen 3 months ago after he had confusion and recurrent seizures in June 2021. Glucose level was 566, he was hypotensive. MRI brain no acute changes. His EEG was abnormal with frequent spikes over the right frontocentral region. He was discharged home on Levetiracetam 500mg  BID. He and his wife deny any seizures or seizure-like symptoms since June 2021. No staring/unresponsive episodes, confusion, gaps in time, olfactory/gustatory hallucinations, myoclonic jerks. He has chronic left leg numbness and tingling. He continues to have bilateral shoulder pain, L>R, unable to abduct left arm to lift much. He has headaches every once in a while which eventually responds to Tylenol and sleep. He has nausea due to chemotherapy, his sense of smell is heightened, he can smell cleaning products distinctly. No vision changes, no falls. Glucose levels much improved, he stays in the 100s.    History on Initial Assessment 11/14/2019: This is a 54 year old right-handed man with a history of hypertension, diabetes, colon cancer on chemotherapy, Graves disease s/p radioactive iodine, PVD, s/p bypass graft, chronic thrombocytopenia, presenting for evaluation of new onset seizure last 10/25/2019. He received chemotherapy then later that evening he lay down at bed then came to the kitchen twice, acting unusually. His wife reports he opened his chemo pouch and was messing with his port. She kept reminding him to stop touching it, he said "yeah," and she asked if it was hurting. He went back to the bedroom and she said good night, then heard a noise and found him unresponsive. He started yelling, both hands were flexed to his face that he  scratched his face. He was turned to his right side and confused after. The seizure lasted a few minutes. He had 2 more seizures witnessed by EMS and was given Versed. He was initially brought to [facility] where glucose level was 566, he was hypotensive with SBP in the 80s and transferred to [facility]. He had an MRI brain which did not show any acute changes. His EEG showed intermittent generalized 4-5 Hz theta slowing with frequent spikes over the right frontocentral region, maximal at F4. He was discharged home on Levetiracetam 500mg  BID, he denies any side effects. He and his wife deny any prior history of seizures but recall 2 episodes of loss of consciousness, he was in thyroid storm one time. Another time 11 years ago he lost his memory, he was going to [facility] but ended up going to [place], he passed out and then apparently walked out of the hospital, they found him on a train track. His ammonia level was high that time. She denies any staring/unresponsive episodes. He denies any gaps in time. Sometimes he smells Clorox or hand sanitizer. He has been very sensitive to smells, smelling Clorox since diagnosed with cancer. He has hypnic jerks in sleep that wake him up, none during the day.   Since the seizure, he has had bilateral shoulder/arm pain, with sharp pains when he tries to lift them up. His glucose levels are better with insulin dose changes. He has paresthesias in both feet. He has occasional headaches but has been complaining of them more recently, with pain over the temples. He has nausea/vomiting with chemotherapy. He has occasional sensation of  food getting stuck in his throat. He has chronic back pain. No bowel/bladder dysfunction. Memory is okay. He denies any alcohol use. He has sleep difficulties, melatonin does not help. He had a normal birth and early development.  There is no history of febrile convulsions, CNS infections such as meningitis/encephalitis, significant traumatic  brain injury, neurosurgical procedures, or family history of seizures.   PAST MEDICAL HISTORY: Past Medical History:  Diagnosis Date  . Allergic rhinitis   . Anxiety   . Arthritis   . Asthma   . At risk for sleep apnea    STOP-BANG= 6       SENT TO PCP 01-22-2015  . Chronic back pain    "from the neck to the lower back"   . 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 Depression   . Dysthymic disorder   . ED (erectile dysfunction)   . GERD (gastroesophageal reflux disease)   . History of acute pyelonephritis    01-07-2015  . History of Graves' disease    vs  Multinodular  . History of hiatal hernia   . History of kidney stones   . History of non-ST elevation myocardial infarction (NSTEMI)    Jan 2014--  no CAD;  per notes probable coronary vasospasm  . History of panic attacks   . History of septic shock    01-07-2015--  ureterolithias/ pyelonephritis  . History of thyroid storm    Nov 2011  . Hyperlipidemia   . Hypertension   . Hypothyroidism following radioiodine therapy    RAI in Mar 2012---  followed by dr Loanne Drilling  . PAD (peripheral artery disease) [facility]) cardiologist-  dr Fletcher Anon   a.  ABI (01/2014):  L 0.53; R 1.0 >> referred to PV  . Right ureteral stone   . Septic shock ([place]) 01/05/2015  . Sleep disturbance   . Thrombocytopenia ([place])     MEDICATIONS: Current Outpatient Medications on File Prior to Visit  Medication Sig Dispense Refill  . ACCU-CHEK SOFTCLIX LANCETS lancets USE  TID TO CHECK BLOOD SUGAR LEVELS  0  . amLODipine (NORVASC) 5 MG tablet TAKE 1 TABLET(5 MG) BY MOUTH DAILY (Patient taking differently: Take 5 mg by mouth daily. ) 90 tablet 3  . aspirin EC 81 MG tablet Take 81 mg by mouth daily.    Marland Kitchen atorvastatin (LIPITOR) 80 MG tablet TAKE 1 TABLET(80 MG) BY MOUTH EVERY EVENING (Patient taking differently: Take 80 mg by mouth daily. ) 90 tablet 3  .  dapagliflozin propanediol (FARXIGA) 5 MG TABS tablet Take 5 mg by mouth daily.    Marland Kitchen docusate sodium (COLACE) 100 MG capsule Take 100 mg by mouth 2 (two) times daily.    . insulin aspart (NOVOLOG) 100 UNIT/ML injection Inject 12 Units into the skin 3 (three) times daily before meals. 12 mL 0  . Insulin Degludec (TRESIBA FLEXTOUCH [place]) Inject 90 Units into the skin daily.     Marland Kitchen levETIRAcetam (KEPPRA) 500 MG tablet Take 1 tablet (500 mg total) by mouth 2 (two) times daily. 180 tablet 3  . levothyroxine (SYNTHROID) 150 MCG tablet Take 1 tablet by mouth once daily. **Must be seen in office for future refills** 30 tablet 0  . lidocaine-prilocaine (EMLA) cream Apply 1 application topically as needed. Apply to portacath site 1 hour prior to use 30 g 3  . LORazepam (ATIVAN) 0.5 MG tablet Take 1  tablet (0.5 mg total) by mouth every 8 (eight) hours as needed for anxiety or sleep (nausea). 30 tablet 0  . meclizine (ANTIVERT) 25 MG tablet Take 25 mg by mouth 3 (three) times daily as needed.   1  . metFORMIN (GLUCOPHAGE-XR) 500 MG 24 hr tablet Take 1,000 mg by mouth in the morning and at bedtime.   3  . metoprolol tartrate (LOPRESSOR) 50 MG tablet TAKE 1/2 TABLET BY MOUTH TWICE DAILY 90 tablet 1  . OLANZapine (ZYPREXA) 5 MG tablet TAKE 1 TABLET(5 MG) BY MOUTH DAILY AFTER CHEMOTHERAPY FOR 3 DAYS. START ON DAY OF PUMP DISCONTINUE 6 tablet 1  . ondansetron (ZOFRAN) 8 MG tablet Take 1 tablet (8 mg total) by mouth every 8 (eight) hours as needed for nausea or vomiting. Begin 72 hours after chemo treatment 30 tablet 1  . oxyCODONE-acetaminophen (PERCOCET) 10-325 MG tablet Take 1 tablet by mouth every 6 (six) hours as needed for pain (for back pain). 30 tablet 0  . pantoprazole (PROTONIX) 40 MG tablet Take 40 mg by mouth daily.   5  . promethazine (PHENERGAN) 12.5 MG tablet Take 1 tablet (12.5 mg total) by mouth every 6 (six) hours as needed. (Patient taking differently: Take 12.5 mg by mouth every 6 (six) hours as needed  for nausea or vomiting. ) 30 tablet 2  . tamsulosin (FLOMAX) 0.4 MG CAPS capsule Take 0.4 mg by mouth every evening.     . venlafaxine XR (EFFEXOR-XR) 75 MG 24 hr capsule Take 225 mg by mouth daily.    . nitroGLYCERIN (NITROSTAT) 0.4 MG SL tablet PLACE 1 TABLET UNDER THE TONGUE EVERY 5 MINUTES AS NEEDED FOR CHEST PAIN. (Patient not taking: Reported on 01/02/2020) 25 tablet 0   Current Facility-Administered Medications on File Prior to Visit  Medication Dose Route Frequency Provider Last Rate Last Admin  . alteplase (CATHFLO ACTIVASE) injection 2 mg  2 mg Intracatheter Once PRN Betsy Coder B, MD      . sodium chloride flush (NS) 0.9 % injection 10 mL  10 mL Intravenous PRN Ladell Pier, MD   10 mL at 02/04/16 1431  . sodium chloride flush (NS) 0.9 % injection 10 mL  10 mL Intravenous PRN Ladell Pier, MD   10 mL at 10/18/16 0263  . sodium chloride flush (NS) 0.9 % injection 10 mL  10 mL Intravenous PRN Ladell Pier, MD   10 mL at 05/03/17 0855    ALLERGIES: Allergies  Allergen Reactions  . Hydrocodone Hives    FAMILY HISTORY: Family History  Problem Relation Age of Onset  . Thyroid disease Mother        hypothyroidism  . Heart attack Maternal Grandfather   . Heart Problems Father        pacermaker  . Edema Father   . Heart disease Maternal Grandmother   . Lung cancer Maternal Grandmother   . Diabetes Maternal Grandmother   . Hypertension Maternal Grandmother     SOCIAL HISTORY: Social History   Socioeconomic History  . Marital status: Married    Spouse name: Not on file  . Number of children: 5  . Years of education: Not on file  . Highest education level: Not on file  Occupational History  . Occupation: Disabled    Employer: UNEMPLOYED  Tobacco Use  . Smoking status: Never Smoker  . Smokeless tobacco: Never Used  Vaping Use  . Vaping Use: Never used  Substance and Sexual Activity  . Alcohol use:  Not Currently    Alcohol/week: 0.0 standard drinks     Comment: RARE  . Drug use: No  . Sexual activity: Not on file  Other Topics Concern  . Not on file  Social History Narrative   Married - wife works Surveyor, quantity unit @ [employer]   5 daughters   Regular exercise-no   Disabled to due back problem   12/04/2015   Right handed    Social Determinants of Health   Financial Resource Strain:   . Difficulty of Paying Living Expenses: Not on file  Food Insecurity:   . Worried About Charity fundraiser in the Last Year: Not on file  . Ran Out of Food in the Last Year: Not on file  Transportation Needs:   . Lack of Transportation (Medical): Not on file  . Lack of Transportation (Non-Medical): Not on file  Physical Activity:   . Days of Exercise per Week: Not on file  . Minutes of Exercise per Session: Not on file  Stress:   . Feeling of Stress : Not on file  Social Connections:   . Frequency of Communication with Friends and Family: Not on file  . Frequency of Social Gatherings with Friends and Family: Not on file  . Attends Religious Services: Not on file  . Active Member of Clubs or Organizations: Not on file  . Attends Archivist Meetings: Not on file  . Marital Status: Not on file  Intimate Partner Violence:   . Fear of Current or Ex-Partner: Not on file  . Emotionally Abused: Not on file  . Physically Abused: Not on file  . Sexually Abused: Not on file     PHYSICAL EXAM: Vitals:   03/05/20 1146  BP: (!) 156/99  Pulse: (!) 122  SpO2: 98%   General: No acute distress Head:  Normocephalic/atraumatic Skin/Extremities: No rash, no edema Neurological Exam: alert and awake. No aphasia or dysarthria. Fund of knowledge is appropriate.  Recent and remote memory are intact.  Attention and concentration are normal.   Cranial nerves: Pupils equal, round. Extraocular movements intact with no nystagmus. Visual fields full.  No facial asymmetry.  Motor: Bulk and tone normal, pain with left shoulder abduction, 4/5, otherwise 5/5  throughout. Finger to nose testing intact.  Gait narrow-based and steady, no ataxia.  Romberg negative.   IMPRESSION: This is a 54 yo RH man with a history of hypertension, diabetes, colon cancer on chemotherapy, Graves disease s/p radioactive iodine, PVD, s/p bypass graft, chronic thrombocytopenia, with new onset seizure last 10/25/2019. This was preceded by confusion. His glucose levels were very elevated in the 500s on admission. His EEG was abnormal with frequent spikes over the right frontocentral region, maximal at F4. MRI brain unremarkable. Etiology unknown, focal seizures can be induced by hyperglycemia, and seizure control is associated with resolution of hyperglycemia. They report his glucose levels are much improved, no seizures since June 2021. We discussed doing a 24-hour EEG, if normal, we will plan to wean off Levetiracetam after 6 months seizure-free. We discussed risks of breakthrough seizure with any medication adjustment, continue with glucose control. We again discussed [place] driving laws to stop driving until 6 months seizure-free. Follow-up in 4 months, they know to call for any changes.   Thank you for allowing me to participate in his care.  Please do not hesitate to call for any questions or concerns.   Ellouise Newer, M.D.   CC: Lucita Lora, NP

## 2020-03-06 ENCOUNTER — Telehealth: Payer: Self-pay | Admitting: *Deleted

## 2020-03-06 NOTE — Telephone Encounter (Signed)
Called and lMOM offering Monday Oct 25th at 9:30 or to call so we can make an appointment that suits him.

## 2020-03-08 ENCOUNTER — Other Ambulatory Visit: Payer: Self-pay | Admitting: Oncology

## 2020-03-09 NOTE — Telephone Encounter (Signed)
LMOM about a cancellation for 24 EEG Wednesday 10/27@7 :30am if he wants this appt to call us back and if not to call us back so we might find a time that works for him.

## 2020-03-12 ENCOUNTER — Encounter: Payer: Self-pay | Admitting: Nurse Practitioner

## 2020-03-12 ENCOUNTER — Inpatient Hospital Stay: Payer: Medicaid Other

## 2020-03-12 ENCOUNTER — Other Ambulatory Visit: Payer: Self-pay

## 2020-03-12 ENCOUNTER — Inpatient Hospital Stay (HOSPITAL_BASED_OUTPATIENT_CLINIC_OR_DEPARTMENT_OTHER): Payer: Medicaid Other | Admitting: Nurse Practitioner

## 2020-03-12 ENCOUNTER — Telehealth: Payer: Self-pay | Admitting: Nurse Practitioner

## 2020-03-12 VITALS — BP 150/90 | HR 99 | Temp 99.2°F | Resp 17 | Ht 70.0 in | Wt 235.8 lb

## 2020-03-12 DIAGNOSIS — C187 Malignant neoplasm of sigmoid colon: Secondary | ICD-10-CM

## 2020-03-12 DIAGNOSIS — C787 Secondary malignant neoplasm of liver and intrahepatic bile duct: Secondary | ICD-10-CM

## 2020-03-12 DIAGNOSIS — Z5112 Encounter for antineoplastic immunotherapy: Secondary | ICD-10-CM | POA: Diagnosis not present

## 2020-03-12 DIAGNOSIS — C189 Malignant neoplasm of colon, unspecified: Secondary | ICD-10-CM

## 2020-03-12 LAB — CMP (CANCER CENTER ONLY)
ALT: 66 U/L — ABNORMAL HIGH (ref 0–44)
AST: 58 U/L — ABNORMAL HIGH (ref 15–41)
Albumin: 3.8 g/dL (ref 3.5–5.0)
Alkaline Phosphatase: 116 U/L (ref 38–126)
Anion gap: 14 (ref 5–15)
BUN: 7 mg/dL (ref 6–20)
CO2: 22 mmol/L (ref 22–32)
Calcium: 9.4 mg/dL (ref 8.9–10.3)
Chloride: 103 mmol/L (ref 98–111)
Creatinine: 1.17 mg/dL (ref 0.61–1.24)
GFR, Estimated: >60 mL/min (ref >60)
Glucose, Bld: 236 mg/dL — ABNORMAL HIGH (ref 70–99)
Potassium: 3.6 mmol/L (ref 3.5–5.1)
Sodium: 139 mmol/L (ref 135–145)
Total Bilirubin: 0.5 mg/dL (ref 0.3–1.2)
Total Protein: 7.8 g/dL (ref 6.5–8.1)

## 2020-03-12 LAB — CBC WITH DIFFERENTIAL (CANCER CENTER ONLY)
Abs Immature Granulocytes: 0.05 10*3/uL (ref 0.00–0.07)
Basophils Absolute: 0 10*3/uL (ref 0.0–0.1)
Basophils Relative: 0 %
Eosinophils Absolute: 0.2 10*3/uL (ref 0.0–0.5)
Eosinophils Relative: 4 %
HCT: 47.7 % (ref 39.0–52.0)
Hemoglobin: 14.8 g/dL (ref 13.0–17.0)
Immature Granulocytes: 1 %
Lymphocytes Relative: 52 %
Lymphs Abs: 2.9 10*3/uL (ref 0.7–4.0)
MCH: 27.2 pg (ref 26.0–34.0)
MCHC: 31 g/dL (ref 30.0–36.0)
MCV: 87.7 fL (ref 80.0–100.0)
Monocytes Absolute: 0.7 10*3/uL (ref 0.1–1.0)
Monocytes Relative: 12 %
Neutro Abs: 1.8 10*3/uL (ref 1.7–7.7)
Neutrophils Relative %: 31 %
Platelet Count: 197 10*3/uL (ref 150–400)
RBC: 5.44 MIL/uL (ref 4.22–5.81)
RDW: 15.6 % — ABNORMAL HIGH (ref 11.5–15.5)
WBC Count: 5.7 10*3/uL (ref 4.0–10.5)
nRBC: 0 % (ref 0.0–0.2)

## 2020-03-12 LAB — CEA (IN HOUSE-CHCC): CEA (CHCC-In House): 4.47 ng/mL (ref 0.00–5.00)

## 2020-03-12 LAB — TOTAL PROTEIN, URINE DIPSTICK: Protein, ur: NEGATIVE mg/dL

## 2020-03-12 MED ORDER — SODIUM CHLORIDE 0.9 % IV SOLN
7.5000 mg/kg | Freq: Once | INTRAVENOUS | Status: AC
  Administered 2020-03-12: 800 mg via INTRAVENOUS
  Filled 2020-03-12: qty 32

## 2020-03-12 MED ORDER — SODIUM CHLORIDE 0.9 % IV SOLN
Freq: Once | INTRAVENOUS | Status: AC
  Filled 2020-03-12: qty 250

## 2020-03-12 MED ORDER — PALONOSETRON HCL INJECTION 0.25 MG/5ML
0.2500 mg | Freq: Once | INTRAVENOUS | Status: AC
  Administered 2020-03-12: 0.25 mg via INTRAVENOUS

## 2020-03-12 MED ORDER — DEXAMETHASONE SODIUM PHOSPHATE 10 MG/ML IJ SOLN
INTRAMUSCULAR | Status: AC
  Filled 2020-03-12: qty 1

## 2020-03-12 MED ORDER — PALONOSETRON HCL INJECTION 0.25 MG/5ML
INTRAVENOUS | Status: AC
  Filled 2020-03-12: qty 5

## 2020-03-12 MED ORDER — DEXAMETHASONE SODIUM PHOSPHATE 10 MG/ML IJ SOLN
5.0000 mg | Freq: Once | INTRAMUSCULAR | Status: AC
  Administered 2020-03-12: 5 mg via INTRAVENOUS

## 2020-03-12 MED ORDER — ATROPINE SULFATE 0.4 MG/ML IJ SOLN
INTRAMUSCULAR | Status: AC
  Filled 2020-03-12: qty 1

## 2020-03-12 MED ORDER — SODIUM CHLORIDE 0.9 % IV SOLN
180.0000 mg/m2 | Freq: Once | INTRAVENOUS | Status: AC
  Administered 2020-03-12: 420 mg via INTRAVENOUS
  Filled 2020-03-12: qty 15

## 2020-03-12 MED ORDER — SODIUM CHLORIDE 0.9 % IV SOLN
2000.0000 mg/m2 | INTRAVENOUS | Status: DC
  Administered 2020-03-12: 4550 mg via INTRAVENOUS
  Filled 2020-03-12: qty 91

## 2020-03-12 MED ORDER — ATROPINE SULFATE 0.4 MG/ML IJ SOLN
0.4000 mg | Freq: Once | INTRAMUSCULAR | Status: AC
  Administered 2020-03-12: 0.4 mg via INTRAVENOUS

## 2020-03-12 MED ORDER — SODIUM CHLORIDE 0.9 % IV SOLN
400.0000 mg/m2 | Freq: Once | INTRAVENOUS | Status: AC
  Administered 2020-03-12: 912 mg via INTRAVENOUS
  Filled 2020-03-12: qty 45.6

## 2020-03-12 MED ORDER — FLUOROURACIL CHEMO INJECTION 2.5 GM/50ML
400.0000 mg/m2 | Freq: Once | INTRAVENOUS | Status: AC
  Administered 2020-03-12: 900 mg via INTRAVENOUS
  Filled 2020-03-12: qty 18

## 2020-03-12 MED ORDER — SODIUM CHLORIDE 0.9 % IV SOLN
150.0000 mg | Freq: Once | INTRAVENOUS | Status: AC
  Administered 2020-03-12: 150 mg via INTRAVENOUS
  Filled 2020-03-12: qty 150

## 2020-03-12 NOTE — Patient Instructions (Signed)
[facility] Discharge Instructions for Patients Receiving Chemotherapy  Today you received the following chemotherapy agents: bevacizumab/irinotecan/leucovorin/fluorouracil.  To help prevent nausea and vomiting after your treatment, we encourage you to take your nausea medication as directed.   If you develop nausea and vomiting that is not controlled by your nausea medication, call the clinic.   BELOW ARE SYMPTOMS THAT SHOULD BE REPORTED IMMEDIATELY:  *FEVER GREATER THAN 100.5 F  *CHILLS WITH OR WITHOUT FEVER  NAUSEA AND VOMITING THAT IS NOT CONTROLLED WITH YOUR NAUSEA MEDICATION  *UNUSUAL SHORTNESS OF BREATH  *UNUSUAL BRUISING OR BLEEDING  TENDERNESS IN MOUTH AND THROAT WITH OR WITHOUT PRESENCE OF ULCERS  *URINARY PROBLEMS  *BOWEL PROBLEMS  UNUSUAL RASH Items with * indicate a potential emergency and should be followed up as soon as possible.  Feel free to call the clinic should you have any questions or concerns. The clinic phone number is [phone].  Please show the CHEMO ALERT CARD at check-in to the Emergency Department and triage nurse.   

## 2020-03-12 NOTE — Telephone Encounter (Signed)
Scheduled per 10/28 los. No calendar for avs needed to be printed. Spoke with pt's wife and is aware of appt times and dates.

## 2020-03-12 NOTE — Progress Notes (Signed)
[place] OFFICE PROGRESS NOTE   Diagnosis:  Colon cancer  INTERVAL HISTORY:   Jeff [name] returns as scheduled.  He completed another cycle of FOLFIRI/bevacizumab 02/19/2020.  The nausea was better than with previous cycles.  No mouth sores.  No diarrhea.  No hand or foot pain or redness.  He denies bleeding.  No abdominal pain.  Intermittent "gas".  He reports improved control of blood sugars.  Objective:  Vital signs in last 24 hours:  Blood pressure (!) 150/90, pulse 99, temperature 99.2 F (37.3 C), temperature source Tympanic, resp. rate 17, height $RemoveBe'5\' 10"'ysiMBbKAr$  (1.778 m), weight 235 lb 12.8 oz (107 kg), SpO2 100 %.    HEENT: No thrush or ulcers. Resp: Lungs clear bilaterally. Cardio: Regular rate and rhythm. GI: Abdomen soft and nontender.  No hepatomegaly. Vascular: No leg edema. Skin: Palms with skin thickening, no erythema. Port-A-Cath without erythema.   Lab Results:  Lab Results  Component Value Date   WBC 5.7 03/12/2020   HGB 14.8 03/12/2020   HCT 47.7 03/12/2020   MCV 87.7 03/12/2020   PLT 197 03/12/2020   NEUTROABS 1.8 03/12/2020    Imaging:  No results found.  Medications: I have reviewed the patient's current medications.  Assessment/Plan: 1. 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 CTschest, abdomen, and pelvis on 12/11/2015-indeterminate tiny pulmonary nodules, multiple liver metastases, small nodes in the sigmoid mesocolon  3. Laparoscopic sigmoid colectomy, liver biopsy, Port-A-Cath placement 01/14/2016  4. Pathology sigmoid colon resection-colonic adenocarcinoma, 5.3 cm extending into pericolonic connective tissue, positive lymph-vascular invasion, positive perineural invasion, negative margins, metastatic carcinoma in9 of 28 lymph nodes  5. Pathology liver biopsy-metastatic colorectal adenocarcinoma  6. MSI stable; mismatch repair protein  normal  7. 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 Cycle 1 FOLFOX 02/02/2016  9. Cycle 2 FOLFOX 02/15/2016  10. Cycle 3 FOLFOX 02/29/2016  11. Cycle 4 FOLFOX 03/14/2016  12. Cycle 5 FOLFOX 03/28/2016  13. Cycle 6 FOLFOX 04/11/2016 (oxaliplatin held secondary to thrombocytopenia)  14. 04/21/2016 restaging CTs-difficulty evaluating liver lesions due to hepatic steatosis. Stable right adrenal nodule. No adenopathy or local recurrence near the rectosigmoid anastomotic site.  15. Cycle 7 FOLFOX 04/25/2016  16. MRI liver 05/02/2016-partial improvement in hepatic metastases  17. Cycle 8 FOLFOX 05/10/2016  18. Cycle 9 FOLFOX 05/23/2016 (oxaliplatin held due to thrombocytopenia)  19. Cycle 10 FOLFOX 06/06/2016 (oxaliplatin dose reduced due to thrombocytopenia)  20. Cycle 11 FOLFOX 06/27/2016 (oxaliplatin held due to neuropathy)  21. Cycle 12 FOLFOX 07/11/2016 (oxaliplatin held) 22. Initiation of maintenance Xeloda 7 days on/7 days off 07/27/2016 23. MRI liver 11/18/2016-decrease in hepatic metastatic disease. No new or progressive disease identified within the abdomen. 24. Continuation of Xeloda 7 days on/7 days off 25. MRI liver 04/27/2017-previous liver lesions not identified, no new lesions, no lymphadenopathy 26. Xeloda continued 7 days on/7 days off 27. MRI liver 12/04/2017 -no evidence of metastatic disease, hepatic steatosis 28. Xeloda continued 7 days on/7 days off 29. MRI liver 07/15/2018-no evidence of metastatic disease. Stable severe hepatic steatosis. 30. Xeloda continued 7 days on/7 days off 31. MRI liver 03/16/2019-hepatic steatosis, no liver mass, focal area of intrahepatic biliary dilatation in segments 2 and 3 of the left lobe-increased 32. Xeloda continued 7 days on/7 days off 33. MRI abdomen 08/19/2019-no findings to suggest liver metastases. Bilateral lung nodules measuring up to 2.8 cm, progressive and more conspicuous than on previous exam 34.  CT chest  08/29/2019-multiple pulmonary metastases, new from 04/21/2016 35. Cycle 1 FOLFIRI/bevacizumab 09/09/2019 36. Cycle 2 FOLFIRI/bevacizumab 09/26/2019 37. Cycle 3 FOLFIRI/bevacizumab 10/10/2019 38. Cycle 4 FOLFIRI/bevacizumab 10/24/2019 39. Cycle 5 FOLFIRI/bevacizumab 11/07/2019 40. CT chest 11/14/2019-decreased size of lung nodules, no new lesions, hepatic steatosis 41. Cycle 6 FOLFIRI/bevacizumab 11/21/2019 42. Cycle 7 FOLFIRI/bevacizumab 12/05/2019 43. Cycle 8 FOLFIRI/bevacizumab 12/19/2019 44. Cycle 9 FOLFIRI/bevacizumab 01/02/2020 45. Cycle 10 FOLFIRI/bevacizumab 01/16/2020 46. CT chest 01/29/2020-stable bilateral pulmonary metastases.  No new or progressive metastatic disease in the chest. 47. Cycle 11 FOLFIRI/bevacizumab 01/30/2020 48. Cycle 12 FOLFIRI/bevacizumab 02/19/2020 49. Cycle 13 FOLFIRI/bevacizumab 03/12/2020  2. Rectal bleeding and constipation secondary to #1  3. History of peripheral vascular disease, status post left lower extremity vascular bypass surgery in April 2017  4. History of nephrolithiasis  5. History of Graves' disease treated with radioactive iodine  6. Anxiety/depression  7. Hypertension  8. Hospitalization 01/19/2016 with wound dehiscence status post secondary suture closure of abdominal wall  9. 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 Hyperglycemia 06/20/2016-diagnosed with diabetes, maintained on insulin  11.Positive COVID test 12/13/2018  12. Hospitalized with seizure activity/DKA. Now on Keppra, insulin adjusted. 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    Disposition: Mr. Stoffers appears well.  There is no clinical evidence of disease progression.  He has completed 12 cycles of FOLFIRI/bevacizumab.  Plan to proceed with cycle 13 today as  scheduled.  We reviewed the CBC from today. Counts adequate to proceed with treatment.  He will return for lab, follow-up, FOLFIRI/bevacizumab in 3 weeks.    Ned Card ANP/GNP-BC   03/12/2020  9:57 AM

## 2020-03-14 ENCOUNTER — Inpatient Hospital Stay: Payer: Medicaid Other

## 2020-03-14 ENCOUNTER — Other Ambulatory Visit: Payer: Self-pay

## 2020-03-14 VITALS — BP 162/98 | HR 105 | Temp 98.0°F | Resp 18

## 2020-03-14 DIAGNOSIS — Z5112 Encounter for antineoplastic immunotherapy: Secondary | ICD-10-CM | POA: Diagnosis not present

## 2020-03-14 DIAGNOSIS — C187 Malignant neoplasm of sigmoid colon: Secondary | ICD-10-CM

## 2020-03-14 MED ORDER — SODIUM CHLORIDE 0.9% FLUSH
10.0000 mL | INTRAVENOUS | Status: DC | PRN
  Administered 2020-03-14: 10 mL
  Filled 2020-03-14: qty 10

## 2020-03-14 MED ORDER — HEPARIN SOD (PORK) LOCK FLUSH 100 UNIT/ML IV SOLN
500.0000 [IU] | Freq: Once | INTRAVENOUS | Status: AC | PRN
  Administered 2020-03-14: 500 [IU]
  Filled 2020-03-14: qty 5

## 2020-03-16 NOTE — Telephone Encounter (Signed)
LMOM to call us to set up ambulatory EEG.

## 2020-03-23 NOTE — Telephone Encounter (Signed)
LMOM to call us back to set up ambulatory EEG. Apologized that I was unable to take the call from your wife due to being with a patient and then when I called back she was no longer available.

## 2020-03-29 ENCOUNTER — Other Ambulatory Visit: Payer: Self-pay | Admitting: Oncology

## 2020-04-02 ENCOUNTER — Inpatient Hospital Stay: Payer: Medicaid Other

## 2020-04-02 ENCOUNTER — Inpatient Hospital Stay: Payer: Medicaid Other | Attending: Oncology | Admitting: Nurse Practitioner

## 2020-04-02 ENCOUNTER — Other Ambulatory Visit: Payer: Self-pay

## 2020-04-02 ENCOUNTER — Encounter: Payer: Self-pay | Admitting: Nurse Practitioner

## 2020-04-02 ENCOUNTER — Telehealth: Payer: Self-pay | Admitting: Nurse Practitioner

## 2020-04-02 VITALS — BP 155/90 | HR 114 | Temp 99.6°F | Resp 18 | Ht 70.0 in | Wt 233.9 lb

## 2020-04-02 VITALS — HR 103

## 2020-04-02 DIAGNOSIS — Z5112 Encounter for antineoplastic immunotherapy: Secondary | ICD-10-CM | POA: Insufficient documentation

## 2020-04-02 DIAGNOSIS — C189 Malignant neoplasm of colon, unspecified: Secondary | ICD-10-CM | POA: Diagnosis not present

## 2020-04-02 DIAGNOSIS — C7802 Secondary malignant neoplasm of left lung: Secondary | ICD-10-CM | POA: Diagnosis not present

## 2020-04-02 DIAGNOSIS — C787 Secondary malignant neoplasm of liver and intrahepatic bile duct: Secondary | ICD-10-CM | POA: Diagnosis not present

## 2020-04-02 DIAGNOSIS — Z5111 Encounter for antineoplastic chemotherapy: Secondary | ICD-10-CM | POA: Insufficient documentation

## 2020-04-02 DIAGNOSIS — Z95828 Presence of other vascular implants and grafts: Secondary | ICD-10-CM

## 2020-04-02 DIAGNOSIS — C187 Malignant neoplasm of sigmoid colon: Secondary | ICD-10-CM | POA: Diagnosis present

## 2020-04-02 DIAGNOSIS — C7801 Secondary malignant neoplasm of right lung: Secondary | ICD-10-CM | POA: Insufficient documentation

## 2020-04-02 LAB — CBC WITH DIFFERENTIAL (CANCER CENTER ONLY)
Abs Immature Granulocytes: 0.05 10*3/uL (ref 0.00–0.07)
Basophils Absolute: 0 10*3/uL (ref 0.0–0.1)
Basophils Relative: 0 %
Eosinophils Absolute: 0.1 10*3/uL (ref 0.0–0.5)
Eosinophils Relative: 2 %
HCT: 45.3 % (ref 39.0–52.0)
Hemoglobin: 13.8 g/dL (ref 13.0–17.0)
Immature Granulocytes: 1 %
Lymphocytes Relative: 49 %
Lymphs Abs: 2.5 10*3/uL (ref 0.7–4.0)
MCH: 27.1 pg (ref 26.0–34.0)
MCHC: 30.5 g/dL (ref 30.0–36.0)
MCV: 89 fL (ref 80.0–100.0)
Monocytes Absolute: 0.6 10*3/uL (ref 0.1–1.0)
Monocytes Relative: 12 %
Neutro Abs: 1.8 10*3/uL (ref 1.7–7.7)
Neutrophils Relative %: 36 %
Platelet Count: 188 10*3/uL (ref 150–400)
RBC: 5.09 MIL/uL (ref 4.22–5.81)
RDW: 15.9 % — ABNORMAL HIGH (ref 11.5–15.5)
WBC Count: 5.1 10*3/uL (ref 4.0–10.5)
nRBC: 0 % (ref 0.0–0.2)

## 2020-04-02 LAB — CMP (CANCER CENTER ONLY)
ALT: 64 U/L — ABNORMAL HIGH (ref 0–44)
AST: 50 U/L — ABNORMAL HIGH (ref 15–41)
Albumin: 3.5 g/dL (ref 3.5–5.0)
Alkaline Phosphatase: 113 U/L (ref 38–126)
Anion gap: 9 (ref 5–15)
BUN: 8 mg/dL (ref 6–20)
CO2: 26 mmol/L (ref 22–32)
Calcium: 8.6 mg/dL — ABNORMAL LOW (ref 8.9–10.3)
Chloride: 100 mmol/L (ref 98–111)
Creatinine: 1.46 mg/dL — ABNORMAL HIGH (ref 0.61–1.24)
GFR, Estimated: 57 mL/min — ABNORMAL LOW (ref >60)
Glucose, Bld: 495 mg/dL — ABNORMAL HIGH (ref 70–99)
Potassium: 4.1 mmol/L (ref 3.5–5.1)
Sodium: 135 mmol/L (ref 135–145)
Total Bilirubin: 0.4 mg/dL (ref 0.3–1.2)
Total Protein: 7.4 g/dL (ref 6.5–8.1)

## 2020-04-02 LAB — TOTAL PROTEIN, URINE DIPSTICK: Protein, ur: NEGATIVE mg/dL

## 2020-04-02 LAB — GLUCOSE, CAPILLARY: Glucose-Capillary: 303 mg/dL — ABNORMAL HIGH (ref 70–99)

## 2020-04-02 LAB — CEA (IN HOUSE-CHCC): CEA (CHCC-In House): 5.87 ng/mL — ABNORMAL HIGH (ref 0.00–5.00)

## 2020-04-02 MED ORDER — FLUOROURACIL CHEMO INJECTION 2.5 GM/50ML
400.0000 mg/m2 | Freq: Once | INTRAVENOUS | Status: AC
  Administered 2020-04-02: 900 mg via INTRAVENOUS
  Filled 2020-04-02: qty 18

## 2020-04-02 MED ORDER — SODIUM CHLORIDE 0.9 % IV SOLN
150.0000 mg | Freq: Once | INTRAVENOUS | Status: AC
  Administered 2020-04-02: 150 mg via INTRAVENOUS
  Filled 2020-04-02: qty 150

## 2020-04-02 MED ORDER — SODIUM CHLORIDE 0.9 % IV SOLN
180.0000 mg/m2 | Freq: Once | INTRAVENOUS | Status: AC
  Administered 2020-04-02: 420 mg via INTRAVENOUS
  Filled 2020-04-02: qty 15

## 2020-04-02 MED ORDER — DEXAMETHASONE SODIUM PHOSPHATE 10 MG/ML IJ SOLN
INTRAMUSCULAR | Status: AC
  Filled 2020-04-02: qty 1

## 2020-04-02 MED ORDER — SODIUM CHLORIDE 0.9 % IV SOLN
2000.0000 mg/m2 | INTRAVENOUS | Status: DC
  Administered 2020-04-02: 4550 mg via INTRAVENOUS
  Filled 2020-04-02: qty 91

## 2020-04-02 MED ORDER — ATROPINE SULFATE 0.4 MG/ML IJ SOLN
0.4000 mg | Freq: Once | INTRAMUSCULAR | Status: AC
  Administered 2020-04-02: 0.4 mg via INTRAVENOUS

## 2020-04-02 MED ORDER — ATROPINE SULFATE 1 MG/ML IJ SOLN
INTRAMUSCULAR | Status: AC
  Filled 2020-04-02: qty 1

## 2020-04-02 MED ORDER — PALONOSETRON HCL INJECTION 0.25 MG/5ML
INTRAVENOUS | Status: AC
  Filled 2020-04-02: qty 5

## 2020-04-02 MED ORDER — SODIUM CHLORIDE 0.9% FLUSH
10.0000 mL | INTRAVENOUS | Status: DC | PRN
  Administered 2020-04-02: 10 mL via INTRAVENOUS
  Filled 2020-04-02: qty 10

## 2020-04-02 MED ORDER — ONDANSETRON HCL 8 MG PO TABS: 8.0000 mg | ORAL_TABLET | Freq: Three times a day (TID) | ORAL | 1 refills | Status: DC | PRN

## 2020-04-02 MED ORDER — SODIUM CHLORIDE 0.9 % IV SOLN
400.0000 mg/m2 | Freq: Once | INTRAVENOUS | Status: AC
  Administered 2020-04-02: 912 mg via INTRAVENOUS
  Filled 2020-04-02: qty 45.6

## 2020-04-02 MED ORDER — SODIUM CHLORIDE 0.9 % IV SOLN
Freq: Once | INTRAVENOUS | Status: AC
  Filled 2020-04-02: qty 250

## 2020-04-02 MED ORDER — SODIUM CHLORIDE 0.9 % IV SOLN
7.5000 mg/kg | Freq: Once | INTRAVENOUS | Status: AC
  Administered 2020-04-02: 800 mg via INTRAVENOUS
  Filled 2020-04-02: qty 32

## 2020-04-02 MED ORDER — SODIUM CHLORIDE 0.9% FLUSH
10.0000 mL | INTRAVENOUS | Status: DC | PRN
  Administered 2020-04-02: 10 mL
  Filled 2020-04-02: qty 10

## 2020-04-02 MED ORDER — PALONOSETRON HCL INJECTION 0.25 MG/5ML
0.2500 mg | Freq: Once | INTRAVENOUS | Status: AC
  Administered 2020-04-02: 0.25 mg via INTRAVENOUS

## 2020-04-02 MED ORDER — DEXAMETHASONE SODIUM PHOSPHATE 10 MG/ML IJ SOLN
5.0000 mg | Freq: Once | INTRAMUSCULAR | Status: AC
  Administered 2020-04-02: 5 mg via INTRAVENOUS

## 2020-04-02 MED ORDER — OLANZAPINE 5 MG PO TABS: ORAL_TABLET | ORAL | 1 refills | Status: DC

## 2020-04-02 MED ORDER — ATROPINE SULFATE 0.4 MG/ML IJ SOLN
INTRAMUSCULAR | Status: AC
  Filled 2020-04-02: qty 1

## 2020-04-02 NOTE — Progress Notes (Signed)
Per Ned Card, NP ok to treat with HR 103.   Ned Card, NP requested to have a finger stick glucose completed as his blood glucose was in the 400s in his CMET. Finger stick complete and glucose was 303. Ned Card, NP notified. Per Lattie Haw, ok to proceed with treatment.

## 2020-04-02 NOTE — Progress Notes (Signed)
[facility] OFFICE PROGRESS NOTE   Diagnosis: Colon cancer  INTERVAL HISTORY:   Jeff [name] returns as scheduled.  He completed another cycle of FOLFIRI/bevacizumab 03/12/2020.  He had no significant nausea/vomiting.  No mouth sores.  No diarrhea.  He denies bleeding.  No fever.  No shortness of breath.  He notes coughing if he is around smoke.  He reports blood sugars have been very well controlled.  Objective:  Vital signs in last 24 hours:  Blood pressure (!) 155/90, pulse (!) 114, temperature 99.6 F (37.6 C), temperature source Tympanic, resp. rate 18, height $RemoveBe'5\' 10"'[id number]$  (1.778 m), weight 233 lb 14.4 oz (106.1 kg), SpO2 100 %.    HEENT: No thrush or ulcers. Resp: Lungs clear bilaterally. Cardio: Regular rate and rhythm. GI: Abdomen soft and nontender.  No hepatomegaly. Vascular: No leg edema. Skin: Palms with hyperpigmentation. Port-A-Cath without erythema.   Lab Results:  Lab Results  Component Value Date   WBC 5.1 04/02/2020   HGB 13.8 04/02/2020   HCT 45.3 04/02/2020   MCV 89.0 04/02/2020   PLT 188 04/02/2020   NEUTROABS 1.8 04/02/2020    Imaging:  No results found.  Medications: I have reviewed the patient's current medications.  Assessment/Plan: 1. 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 CTschest, abdomen, and pelvis on 12/11/2015-indeterminate tiny pulmonary nodules, multiple liver metastases, small nodes in the sigmoid mesocolon  3. Laparoscopic sigmoid colectomy, liver biopsy, Port-A-Cath placement 01/14/2016  4. Pathology sigmoid colon resection-colonic adenocarcinoma, 5.3 cm extending into pericolonic connective tissue, positive lymph-vascular invasion, positive perineural invasion, negative margins, metastatic carcinoma in9 of 28 lymph nodes  5. Pathology liver biopsy-metastatic colorectal adenocarcinoma  6. MSI stable; mismatch repair protein  normal  7. 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 Cycle 1 FOLFOX 02/02/2016  9. Cycle 2 FOLFOX 02/15/2016  10. Cycle 3 FOLFOX 02/29/2016  11. Cycle 4 FOLFOX 03/14/2016  12. Cycle 5 FOLFOX 03/28/2016  13. Cycle 6 FOLFOX 04/11/2016 (oxaliplatin held secondary to thrombocytopenia)  14. 04/21/2016 restaging CTs-difficulty evaluating liver lesions due to hepatic steatosis. Stable right adrenal nodule. No adenopathy or local recurrence near the rectosigmoid anastomotic site.  15. Cycle 7 FOLFOX 04/25/2016  16. MRI liver 05/02/2016-partial improvement in hepatic metastases  17. Cycle 8 FOLFOX 05/10/2016  18. Cycle 9 FOLFOX 05/23/2016 (oxaliplatin held due to thrombocytopenia)  19. Cycle 10 FOLFOX 06/06/2016 (oxaliplatin dose reduced due to thrombocytopenia)  20. Cycle 11 FOLFOX 06/27/2016 (oxaliplatin held due to neuropathy)  21. Cycle 12 FOLFOX 07/11/2016 (oxaliplatin held) 22. Initiation of maintenance Xeloda 7 days on/7 days off 07/27/2016 23. MRI liver 11/18/2016-decrease in hepatic metastatic disease. No new or progressive disease identified within the abdomen. 24. Continuation of Xeloda 7 days on/7 days off 25. MRI liver 04/27/2017-previous liver lesions not identified, no new lesions, no lymphadenopathy 26. Xeloda continued 7 days on/7 days off 27. MRI liver 12/04/2017 -no evidence of metastatic disease, hepatic steatosis 28. Xeloda continued 7 days on/7 days off 29. MRI liver 07/15/2018-no evidence of metastatic disease. Stable severe hepatic steatosis. 30. Xeloda continued 7 days on/7 days off 31. MRI liver 03/16/2019-hepatic steatosis, no liver mass, focal area of intrahepatic biliary dilatation in segments 2 and 3 of the left lobe-increased 32. Xeloda continued 7 days on/7 days off 33. MRI abdomen 08/19/2019-no findings to suggest liver metastases. Bilateral lung nodules measuring up to 2.8 cm, progressive and more conspicuous than on previous exam 34. CT chest  08/29/2019-multiple pulmonary metastases, new from 04/21/2016 35. Cycle 1 FOLFIRI/bevacizumab 09/09/2019 36. Cycle 2 FOLFIRI/bevacizumab 09/26/2019 37. Cycle 3 FOLFIRI/bevacizumab 10/10/2019 38. Cycle 4 FOLFIRI/bevacizumab 10/24/2019 39. Cycle 5 FOLFIRI/bevacizumab 11/07/2019 40. CT chest 11/14/2019-decreased size of lung nodules, no new lesions, hepatic steatosis 41. Cycle 6 FOLFIRI/bevacizumab 11/21/2019 42. Cycle 7 FOLFIRI/bevacizumab 12/05/2019 43. Cycle 8 FOLFIRI/bevacizumab 12/19/2019 44. Cycle 9 FOLFIRI/bevacizumab 01/02/2020 45. Cycle 10 FOLFIRI/bevacizumab 01/16/2020 46. CT chest 01/29/2020-stable bilateral pulmonary metastases. No new or progressive metastatic disease in the chest. 47. Cycle 11 FOLFIRI/bevacizumab 01/30/2020 48. Cycle 12 FOLFIRI/bevacizumab 02/19/2020 49. Cycle 13 FOLFIRI/bevacizumab 03/12/2020 50. Cycle 14 FOLFIRI/bevacizumab 04/02/2020  2. Rectal bleeding and constipation secondary to #1  3. History of peripheral vascular disease, status post left lower extremity vascular bypass surgery in April 2017  4. History of nephrolithiasis  5. History of Graves' disease treated with radioactive iodine  6. Anxiety/depression  7. Hypertension  8. Hospitalization 01/19/2016 with wound dehiscence status post secondary suture closure of abdominal wall  9. 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 Hyperglycemia 06/20/2016-diagnosed with diabetes, maintained on insulin  11.Positive COVID test 12/13/2018  12. Hospitalized with seizure activity/DKA. Now on Keppra, insulin adjusted. 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    Disposition: Jeff [name] appears stable.  He has completed 13 cycles of FOLFIRI/bevacizumab.  There is no clinical evidence of disease progression.   Plan to proceed with cycle 14 FOLFIRI/bevacizumab today as scheduled.  We reviewed the CBC from today.  Counts adequate to proceed with treatment.  He will return for lab, follow-up, treatment in 3 weeks.  He will contact the office in the interim with any problems.    Ned Card ANP/GNP-BC   04/02/2020  1:13 PM

## 2020-04-02 NOTE — Patient Instructions (Signed)

## 2020-04-02 NOTE — Patient Instructions (Signed)
[facility] Discharge Instructions for Patients Receiving Chemotherapy  Today you received the following chemotherapy agents: bevacizumab/irinotecan/leucovorin/fluorouracil.  To help prevent nausea and vomiting after your treatment, we encourage you to take your nausea medication as directed.   If you develop nausea and vomiting that is not controlled by your nausea medication, call the clinic.   BELOW ARE SYMPTOMS THAT SHOULD BE REPORTED IMMEDIATELY:  *FEVER GREATER THAN 100.5 F  *CHILLS WITH OR WITHOUT FEVER  NAUSEA AND VOMITING THAT IS NOT CONTROLLED WITH YOUR NAUSEA MEDICATION  *UNUSUAL SHORTNESS OF BREATH  *UNUSUAL BRUISING OR BLEEDING  TENDERNESS IN MOUTH AND THROAT WITH OR WITHOUT PRESENCE OF ULCERS  *URINARY PROBLEMS  *BOWEL PROBLEMS  UNUSUAL RASH Items with * indicate a potential emergency and should be followed up as soon as possible.  Feel free to call the clinic should you have any questions or concerns. The clinic phone number is [phone].  Please show the CHEMO ALERT CARD at check-in to the Emergency Department and triage nurse.   

## 2020-04-02 NOTE — Telephone Encounter (Signed)
Scheduled appointment sper 11/18 los. Spoke to patient's wife who is aware of appointment date and time.

## 2020-04-04 ENCOUNTER — Other Ambulatory Visit: Payer: Self-pay

## 2020-04-04 ENCOUNTER — Inpatient Hospital Stay: Payer: Medicaid Other

## 2020-04-04 VITALS — BP 151/100 | HR 89 | Temp 98.1°F | Resp 18

## 2020-04-04 DIAGNOSIS — C187 Malignant neoplasm of sigmoid colon: Secondary | ICD-10-CM

## 2020-04-04 DIAGNOSIS — Z5112 Encounter for antineoplastic immunotherapy: Secondary | ICD-10-CM | POA: Diagnosis not present

## 2020-04-04 MED ORDER — HEPARIN SOD (PORK) LOCK FLUSH 100 UNIT/ML IV SOLN
500.0000 [IU] | Freq: Once | INTRAVENOUS | Status: AC | PRN
  Administered 2020-04-04: 500 [IU]
  Filled 2020-04-04: qty 5

## 2020-04-04 MED ORDER — SODIUM CHLORIDE 0.9% FLUSH
10.0000 mL | INTRAVENOUS | Status: DC | PRN
  Administered 2020-04-04: 10 mL
  Filled 2020-04-04: qty 10

## 2020-04-04 NOTE — Patient Instructions (Signed)

## 2020-04-06 ENCOUNTER — Encounter: Payer: Self-pay | Admitting: *Deleted

## 2020-04-06 NOTE — Progress Notes (Signed)
Received fax from [facility] that Olanzapine 5 mg tab needs prior authorization. Forwarded to [employer]

## 2020-04-07 ENCOUNTER — Telehealth: Payer: Self-pay | Admitting: *Deleted

## 2020-04-07 MED ORDER — OLANZAPINE 5 MG PO TBDP: 5.0000 mg | ORAL_TABLET | Freq: Every day | ORAL | 1 refills | Status: DC

## 2020-04-07 NOTE — Telephone Encounter (Signed)
Oral olanzapine 5mg  is not on insurance formulary. Will only approve if he fails the 5mg  ODT form of olanzapine. Per Dr. Benay Spice: OK to try the ODT form. Script sent.

## 2020-04-08 ENCOUNTER — Other Ambulatory Visit: Payer: Self-pay

## 2020-04-08 ENCOUNTER — Encounter (HOSPITAL_COMMUNITY): Payer: Self-pay

## 2020-04-08 ENCOUNTER — Other Ambulatory Visit: Payer: Self-pay | Admitting: *Deleted

## 2020-04-08 ENCOUNTER — Emergency Department (HOSPITAL_COMMUNITY): Payer: Medicaid Other

## 2020-04-08 ENCOUNTER — Emergency Department (HOSPITAL_COMMUNITY)
Admission: EM | Admit: 2020-04-08 | Discharge: 2020-04-08 | Disposition: A | Discharge status: Home / Self Care | Payer: Medicaid Other | Attending: Emergency Medicine | Admitting: Emergency Medicine

## 2020-04-08 DIAGNOSIS — Z7984 Long term (current) use of oral hypoglycemic drugs: Secondary | ICD-10-CM | POA: Insufficient documentation

## 2020-04-08 DIAGNOSIS — Z7982 Long term (current) use of aspirin: Secondary | ICD-10-CM | POA: Diagnosis not present

## 2020-04-08 DIAGNOSIS — E1110 Type 2 diabetes mellitus with ketoacidosis without coma: Secondary | ICD-10-CM | POA: Insufficient documentation

## 2020-04-08 DIAGNOSIS — Z79899 Other long term (current) drug therapy: Secondary | ICD-10-CM | POA: Insufficient documentation

## 2020-04-08 DIAGNOSIS — Z794 Long term (current) use of insulin: Secondary | ICD-10-CM | POA: Diagnosis not present

## 2020-04-08 DIAGNOSIS — Z85038 Personal history of other malignant neoplasm of large intestine: Secondary | ICD-10-CM | POA: Diagnosis not present

## 2020-04-08 DIAGNOSIS — J45909 Unspecified asthma, uncomplicated: Secondary | ICD-10-CM | POA: Insufficient documentation

## 2020-04-08 DIAGNOSIS — R569 Unspecified convulsions: Secondary | ICD-10-CM | POA: Insufficient documentation

## 2020-04-08 DIAGNOSIS — E039 Hypothyroidism, unspecified: Secondary | ICD-10-CM | POA: Diagnosis not present

## 2020-04-08 DIAGNOSIS — I10 Essential (primary) hypertension: Secondary | ICD-10-CM | POA: Diagnosis not present

## 2020-04-08 DIAGNOSIS — Z955 Presence of coronary angioplasty implant and graft: Secondary | ICD-10-CM | POA: Insufficient documentation

## 2020-04-08 LAB — CBC WITH DIFFERENTIAL/PLATELET
Abs Immature Granulocytes: 0.07 10*3/uL (ref 0.00–0.07)
Basophils Absolute: 0 10*3/uL (ref 0.0–0.1)
Basophils Relative: 0 %
Eosinophils Absolute: 0 10*3/uL (ref 0.0–0.5)
Eosinophils Relative: 0 %
HCT: 45.1 % (ref 39.0–52.0)
Hemoglobin: 14 g/dL (ref 13.0–17.0)
Immature Granulocytes: 1 %
Lymphocytes Relative: 24 %
Lymphs Abs: 2 10*3/uL (ref 0.7–4.0)
MCH: 27.1 pg (ref 26.0–34.0)
MCHC: 31 g/dL (ref 30.0–36.0)
MCV: 87.2 fL (ref 80.0–100.0)
Monocytes Absolute: 0.3 10*3/uL (ref 0.1–1.0)
Monocytes Relative: 4 %
Neutro Abs: 6 10*3/uL (ref 1.7–7.7)
Neutrophils Relative %: 71 %
Platelets: 149 10*3/uL — ABNORMAL LOW (ref 150–400)
RBC: 5.17 MIL/uL (ref 4.22–5.81)
RDW: 15.4 % (ref 11.5–15.5)
WBC: 8.4 10*3/uL (ref 4.0–10.5)
nRBC: 0 % (ref 0.0–0.2)

## 2020-04-08 LAB — COMPREHENSIVE METABOLIC PANEL
ALT: 55 U/L — ABNORMAL HIGH (ref 0–44)
AST: 55 U/L — ABNORMAL HIGH (ref 15–41)
Albumin: 3.7 g/dL (ref 3.5–5.0)
Alkaline Phosphatase: 74 U/L (ref 38–126)
Anion gap: 11 (ref 5–15)
BUN: 9 mg/dL (ref 6–20)
CO2: 21 mmol/L — ABNORMAL LOW (ref 22–32)
Calcium: 8.2 mg/dL — ABNORMAL LOW (ref 8.9–10.3)
Chloride: 106 mmol/L (ref 98–111)
Creatinine, Ser: 0.91 mg/dL (ref 0.61–1.24)
GFR, Estimated: >60 mL/min (ref >60)
Glucose, Bld: 186 mg/dL — ABNORMAL HIGH (ref 70–99)
Potassium: 4.2 mmol/L (ref 3.5–5.1)
Sodium: 138 mmol/L (ref 135–145)
Total Bilirubin: 0.9 mg/dL (ref 0.3–1.2)
Total Protein: 7.2 g/dL (ref 6.5–8.1)

## 2020-04-08 LAB — CBG MONITORING, ED: Glucose-Capillary: 164 mg/dL — ABNORMAL HIGH (ref 70–99)

## 2020-04-08 LAB — ETHANOL: Alcohol, Ethyl (B): <10 mg/dL (ref <10)

## 2020-04-08 MED ORDER — ACETAMINOPHEN 325 MG PO TABS
650.0000 mg | ORAL_TABLET | Freq: Once | ORAL | Status: AC
  Administered 2020-04-08: 650 mg via ORAL
  Filled 2020-04-08: qty 2

## 2020-04-08 MED ORDER — OLANZAPINE 5 MG PO TABS: 5.0000 mg | ORAL_TABLET | Freq: Every day | ORAL | 1 refills | Status: DC

## 2020-04-08 MED ORDER — LEVETIRACETAM IN NACL 1000 MG/100ML IV SOLN
1000.0000 mg | Freq: Once | INTRAVENOUS | Status: AC
  Administered 2020-04-08: 1000 mg via INTRAVENOUS
  Filled 2020-04-08: qty 100

## 2020-04-08 NOTE — ED Notes (Signed)
BP 149/99 (114) corrected bp

## 2020-04-08 NOTE — Progress Notes (Signed)
Was instructed by pharmacy to change the ODT olanzapine to oral tablet.

## 2020-04-08 NOTE — ED Triage Notes (Addendum)
Pt BIB EMS. Pt had seizure while eating at a restaurant. Pt last seizure was in Sept. Pt does take Keppra but did not take it today. Pt is being tx for colon CA. Pt was tachy on scene. EMS gave 531ml fluids.  BP-154/82 CBG-170 HR-135 O2-98%

## 2020-04-08 NOTE — ED Notes (Signed)
Seizure pads allied to bed railings

## 2020-04-08 NOTE — Discharge Instructions (Addendum)
Continue your current medications.  We did send off a Keppra level today while you are in the ED.  Should result in a few days.  Contact your neurologist to follow-up on the seizure you had today as well as your Keppra level.  Return to the ED for recurrent seizures.

## 2020-04-08 NOTE — ED Provider Notes (Signed)
[facility] DEPT Provider Note   CSN: [id number] Arrival date & time: 04/08/20  1249     History Chief Complaint  Patient presents with  . Seizures    Jeff [name] is a 54 y.o. male.  HPI         Patient presented to the ED for evaluation of a seizure.  Patient does have history of colon cancer as well as seizure disorder.  Patient last had a chemotherapy infusion on 11 member 20th.  Patient is receivingFOLFIRI / BEVACIZUMAB .   Per EMS report patient had a seizure while he was eating at a restaurant.  Patient does not remember the event.  He states he does any remember walking into the restaurant.  He denies any complaints right now.  He is denying any headache or chest pain.  He states he has been taking his seizure medications.  He denies any fevers or chills.  No chest pain or shortness of breath   Past Medical History:  Diagnosis Date  . Allergic rhinitis   . Anxiety   . Arthritis   . Asthma   . At risk for sleep apnea    STOP-BANG= 6       SENT TO PCP 01-22-2015  . Chronic back pain    "from the neck to the lower back"   . 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 Depression   . Dysthymic disorder   . ED (erectile dysfunction)   . GERD (gastroesophageal reflux disease)   . History of acute pyelonephritis    01-07-2015  . History of Graves' disease    vs  Multinodular  . History of hiatal hernia   . History of kidney stones   . History of non-ST elevation myocardial infarction (NSTEMI)    Jan 2014--  no CAD;  per notes probable coronary vasospasm  . History of panic attacks   . History of septic shock    01-07-2015--  ureterolithias/ pyelonephritis  . History of thyroid storm    Nov 2011  . Hyperlipidemia   . Hypertension   . Hypothyroidism following radioiodine therapy    RAI in Mar 2012---  followed by dr Loanne Drilling   . PAD (peripheral artery disease) [facility]) cardiologist-  dr Fletcher Anon   a.  ABI (01/2014):  L 0.53; R 1.0 >> referred to PV  . Right ureteral stone   . Septic shock ([place]) 01/05/2015  . Sleep disturbance   . Thrombocytopenia [facility])     Patient Active Problem List   Diagnosis Date Noted  . DKA (diabetic ketoacidoses) 10/25/2019  . Seizure ([place]) 10/25/2019  . Goals of care, counseling/discussion 09/03/2019  . Diabetes ([place]) 08/10/2016  . Genetic testing 04/20/2016  . Port catheter in place 03/14/2016  . SBO (small bowel obstruction) ([place]) 01/19/2016  . Incisional hernia 01/19/2016  . Metastatic colon cancer to liver ([place]) 01/14/2016  . Cancer of sigmoid colon ([place]) 12/18/2015  . Aftercare following surgery of the circulatory system 09/23/2015  . Left leg claudication ([place]) 09/11/2015  . Ureterolithiasis 01/04/2015  . ED (erectile dysfunction) 12/09/2014  . Hypothyroidism following radioiodine therapy 05/26/2014  . PAD (peripheral artery disease) ([place]) 02/11/2014  . Lumbar facet arthropathy 07/15/2013  . Chest pain, mid sternal 06/12/2012  . Acute non-ST segment elevation myocardial infarction ([place]) 06/11/2012  . Hyperlipidemia 06/11/2012  . 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 Hypertension  03/16/2010    Past Surgical History:  Procedure Laterality Date  . ABDOMINAL AORTAGRAM N/A 02/19/2014   Procedure: ABDOMINAL Maxcine Ham;  Surgeon: Wellington Hampshire, MD;  Location: [facility] CATH LAB;  Service: Cardiovascular;  Laterality: N/A;  . ANTERIOR CERVICAL DECOMP/DISCECTOMY FUSION  09-08-2010   C3 -4  . BACK SURGERY     x5, LOWER X 3, UPPER NECK X 2  . CYSTOSCOPY W/ URETERAL STENT PLACEMENT Right 01/04/2015   Procedure: CYSTOSCOPY WITH RETROGRADE PYELOGRAM/URETERAL STENT PLACEMENT;  Surgeon: Franchot Gallo, MD;  Location: [facility];  Service: Urology;  Laterality: Right;  . CYSTOSCOPY W/ URETERAL STENT REMOVAL Right 01/26/2015   Procedure: CYSTOSCOPY WITH STENT REMOVAL;  Surgeon: Franchot Gallo, MD;  Location[facility];  Service: Urology;  Laterality: Right;  . CYSTOSCOPY/URETEROSCOPY/HOLMIUM LASER/STENT PLACEMENT Right 01/26/2015   Procedure: CYSTOSCOPY/URETEROSCOPY RIGHT;  Surgeon: Franchot Gallo, MD;  Location: [facility];  Service: Urology;  Laterality: Right;  . FEMORAL-POPLITEAL BYPASS GRAFT Left 09/11/2015   Procedure: BYPASS GRAFT LEFT ABOVE KNEE TO BELOW KNEE POPLITEAL ARTERY WITH LEFT GREATER SAPHENOUS VEIN;  Surgeon: Serafina Mitchell, MD;  Location: [facility];  Service: Vascular;  Laterality: Left;  . INCISIONAL HERNIA REPAIR N/A 01/19/2016   Procedure: HERNIA REPAIR INCISIONAL;  Surgeon: Leighton Ruff, MD;  Location: [facility];  Service: General;  Laterality: N/A;  . LAMINECTOMY AND MICRODISCECTOMY LUMBAR SPINE  03-26-2009   left L5 -- S1  . LAPAROSCOPIC SIGMOID COLECTOMY N/A 01/14/2016   Procedure: LAPAROSCOPIC SIGMOID COLECTOMY;  Surgeon: Leighton Ruff, MD;  Location: [facility];  Service: General;  Laterality: N/A;  . LEFT HEART CATHETERIZATION WITH CORONARY ANGIOGRAM N/A 06/12/2012   Procedure: LEFT HEART CATHETERIZATION WITH CORONARY ANGIOGRAM;  Surgeon: Josue Hector, MD;  [facility] CATH LAB;  Service: Cardiovascular;  Laterality: N/A;   No sig. CAD/  normal LVF, ef 55-65%  . LIVER BIOPSY N/A 01/14/2016   Procedure: LIVER BIOPSY;  Surgeon: Leighton Ruff, MD;  Location: [facility];  Service: General;  Laterality: N/A;  . LOWER EXTREMITY ANGIOGRAM Left 05/28/2014   Procedure: LOWER EXTREMITY ANGIOGRAM;  Surgeon: Wellington Hampshire, MD;  Location: [facility] CATH LAB;  Service: Cardiovascular;  Laterality: Left;  Failed PTA CTO  . POPLITEAL ARTERY ANGIOPLASTY Left 05/28/2014    dr Fletcher Anon   Attempted and unsuccessful due to inability to cross the occlusionnotes   . PORTACATH PLACEMENT Right 01/14/2016   Procedure: INSERTION PORT-A-CATH;  Surgeon: Leighton Ruff, MD;  Location: [facility];  Service: General;  Laterality: Right;  . POSTERIOR CERVICAL  FUSION/FORAMINOTOMY N/A 12/10/2012   Procedure: POSTERIOR CERVICAL FUSION/FORAMINOTOMY LEVEL 1;  Surgeon: Ophelia Charter, MD;  Location: [facility] NEURO ORS;  Service: Neurosurgery;  Laterality: N/A;  Cervical three-four posterior cervical fusion with lateral mass screws  . POSTERIOR LUMBAR FUSION  11-11-2009;   07-15-2013   L5 -- S1;   L4-- S1  . SHOULDER ARTHROSCOPY Right 03-08-2004   debridement labral tear/  DCR/  acromioplasty  . TRANSTHORACIC ECHOCARDIOGRAM  01-05-2015   mild LVH/  ef 60-65%/  mild TR  . UMBILICAL HERNIA REPAIR  1980  . VEIN HARVEST Left 09/11/2015   Procedure: LEFT GREATER SAPHENOUS VEIN HARVEST;  Surgeon: Serafina Mitchell, MD;  Location: [facility];  Service: Vascular;  Laterality: Left;       Family History  Problem Relation Age of Onset  . Thyroid disease Mother        hypothyroidism  . Heart attack Maternal Grandfather   . Heart Problems  Father        pacermaker  . Edema Father   . Heart disease Maternal Grandmother   . Lung cancer Maternal Grandmother   . Diabetes Maternal Grandmother   . Hypertension Maternal Grandmother     Social History   Tobacco Use  . Smoking status: Never Smoker  . Smokeless tobacco: Never Used  Vaping Use  . Vaping Use: Never used  Substance Use Topics  . Alcohol use: Not Currently    Alcohol/week: 0.0 standard drinks    Comment: RARE  . Drug use: No    Home Medications Prior to Admission medications   Medication Sig Start Date End Date Taking? Authorizing Provider  amLODipine (NORVASC) 5 MG tablet TAKE 1 TABLET(5 MG) BY MOUTH DAILY Patient taking differently: Take 5 mg by mouth daily.  04/18/19  Yes Wellington Hampshire, MD  aspirin EC 81 MG tablet Take 81 mg by mouth daily.   Yes [provider]  atorvastatin (LIPITOR) 80 MG tablet TAKE 1 TABLET(80 MG) BY MOUTH EVERY EVENING Patient taking differently: Take 80 mg by mouth daily.  04/18/19  Yes Wellington Hampshire, MD  dapagliflozin propanediol (FARXIGA) 5 MG TABS tablet  Take 5 mg by mouth daily.   Yes [provider]  docusate sodium (COLACE) 100 MG capsule Take 100 mg by mouth 2 (two) times daily.   Yes [provider]  insulin aspart (NOVOLOG) 100 UNIT/ML injection Inject 12 Units into the skin 3 (three) times daily before meals. 10/26/19  Yes Gherghe, Vella Redhead, MD  Insulin Degludec (TRESIBA FLEXTOUCH [place]) Inject 90 Units into the skin daily.    Yes [provider]  levETIRAcetam (KEPPRA) 500 MG tablet Take 1 tablet (500 mg total) by mouth 2 (two) times daily. 03/05/20  Yes Cameron Sprang, MD  levothyroxine (SYNTHROID) 150 MCG tablet Take 1 tablet by mouth once daily. **Must be seen in office for future refills** Patient taking differently: Take 150 mcg by mouth daily before breakfast. Take 1 tablet by mouth once daily. **Must be seen in office for future refills** 10/22/18  Yes Renato Shin, MD  lidocaine-prilocaine (EMLA) cream Apply 1 application topically as needed. Apply to portacath site 1 hour prior to use 01/03/19  Yes Ladell Pier, MD  LORazepam (ATIVAN) 0.5 MG tablet Take 1 tablet (0.5 mg total) by mouth every 8 (eight) hours as needed for anxiety or sleep (nausea). 01/30/20  Yes Owens Shark, NP  meclizine (ANTIVERT) 25 MG tablet Take 25 mg by mouth 3 (three) times daily as needed for dizziness.  02/25/16  Yes [provider]  metFORMIN (GLUCOPHAGE-XR) 500 MG 24 hr tablet Take 1,000 mg by mouth in the morning and at bedtime.  10/30/16  Yes [provider]  metoprolol tartrate (LOPRESSOR) 50 MG tablet TAKE 1/2 TABLET BY MOUTH TWICE DAILY Patient taking differently: Take 25 mg by mouth 2 (two) times daily.  11/11/19  Yes Wellington Hampshire, MD  nitroGLYCERIN (NITROSTAT) 0.4 MG SL tablet PLACE 1 TABLET UNDER THE TONGUE EVERY 5 MINUTES AS NEEDED FOR CHEST PAIN. Patient taking differently: Place 0.4 mg under the tongue every 5 (five) minutes as needed for chest pain.  12/11/15  Yes Gollan, Kathlene November, MD  OLANZapine  (ZYPREXA) 5 MG tablet Take 1 tablet (5 mg total) by mouth daily. X 3 days starting on day of pump d/c for each chemo (2 cycles per month) 04/08/20  Yes Ladell Pier, MD  ondansetron (ZOFRAN) 8 MG tablet Take  1 tablet (8 mg total) by mouth every 8 (eight) hours as needed for nausea or vomiting. Begin 72 hours after chemo treatment 04/02/20  Yes Owens Shark, NP  oxyCODONE-acetaminophen (PERCOCET) 10-325 MG tablet Take 1 tablet by mouth every 6 (six) hours as needed for pain (for back pain). 09/11/15  Yes Virgina Jock A, PA-C  pantoprazole (PROTONIX) 40 MG tablet Take 40 mg by mouth daily.  10/23/15  Yes [provider]  promethazine (PHENERGAN) 12.5 MG tablet Take 1 tablet (12.5 mg total) by mouth every 6 (six) hours as needed. Patient taking differently: Take 12.5 mg by mouth every 6 (six) hours as needed for nausea or vomiting.  09/26/19  Yes Owens Shark, NP  tamsulosin (FLOMAX) 0.4 MG CAPS capsule Take 0.4 mg by mouth every evening.    Yes [provider]  venlafaxine XR (EFFEXOR-XR) 75 MG 24 hr capsule Take 225 mg by mouth daily. 09/09/19  Yes [provider]  ACCU-CHEK SOFTCLIX LANCETS lancets USE  TID TO CHECK BLOOD SUGAR LEVELS 06/22/16   [provider]    Allergies    Hydrocodone  Review of Systems   Review of Systems  All other systems reviewed and are negative.   Physical Exam Updated Vital Signs BP (!) 133/101   Pulse (!) 105   Temp 98.1 F (36.7 C) (Oral)   Resp 19   Ht 1.778 m (5\' 10" )   Wt 108.9 kg   SpO2 98%   BMI 34.44 kg/m   Physical Exam Vitals and nursing note reviewed.  Constitutional:      General: He is not in acute distress.    Appearance: He is well-developed.  HENT:     Head: Normocephalic and atraumatic.     Right Ear: External ear normal.     Left Ear: External ear normal.  Eyes:     General: No scleral icterus.       Right eye: No discharge.        Left eye: No discharge.     Conjunctiva/sclera:  Conjunctivae normal.  Neck:     Trachea: No tracheal deviation.  Cardiovascular:     Rate and Rhythm: Normal rate and regular rhythm.  Pulmonary:     Effort: Pulmonary effort is normal. No respiratory distress.     Breath sounds: Normal breath sounds. No stridor. No wheezing or rales.  Abdominal:     General: Bowel sounds are normal. There is no distension.     Palpations: Abdomen is soft.     Tenderness: There is no abdominal tenderness. There is no guarding or rebound.  Musculoskeletal:        General: No tenderness.     Cervical back: Neck supple.  Skin:    General: Skin is warm and dry.     Findings: No rash.  Neurological:     Mental Status: He is alert.     Cranial Nerves: No cranial nerve deficit (No facial droop, extraocular movements intact, tongue midline ).     Sensory: No sensory deficit.     Motor: No abnormal muscle tone or seizure activity.     Coordination: Coordination normal.     Comments: No pronator drift bilateral upper extrem, able to hold both legs off bed for 5 seconds, sensation intact in all extremities, no visual field cuts, no left or right sided neglect, normal finger-nose exam bilaterally, no nystagmus noted Patient is confused.  He thinks it is 2020.  It is the day before  Thanksgiving and he told me the upcoming holidays July 4     ED Results / Procedures / Treatments   Labs (all labs ordered are listed, but only abnormal results are displayed) Labs Reviewed  COMPREHENSIVE METABOLIC PANEL - Abnormal; Notable for the following components:      Result Value   CO2 21 (*)    Glucose, Bld 186 (*)    Calcium 8.2 (*)    AST 55 (*)    ALT 55 (*)    All other components within normal limits  CBC WITH DIFFERENTIAL/PLATELET - Abnormal; Notable for the following components:   Platelets 149 (*)    All other components within normal limits  CBG MONITORING, ED - Abnormal; Notable for the following components:   Glucose-Capillary 164 (*)    All other  components within normal limits  ETHANOL  LEVETIRACETAM LEVEL    EKG None  Radiology CT HEAD WO CONTRAST  Result Date: 04/08/2020 CLINICAL DATA:  Seizure EXAM: CT HEAD WITHOUT CONTRAST TECHNIQUE: Contiguous axial images were obtained from the base of the skull through the vertex without intravenous contrast. COMPARISON:  10/25/2019 FINDINGS: Brain: No acute intracranial abnormality. Specifically, no hemorrhage, hydrocephalus, mass lesion, acute infarction, or significant intracranial injury. Vascular: No hyperdense vessel or unexpected calcification. Skull: No acute calvarial abnormality. Sinuses/Orbits: Visualized paranasal sinuses and mastoids clear. Orbital soft tissues unremarkable. Other: None IMPRESSION: No acute intracranial abnormality. Electronically Signed   By: Rolm Baptise M.D.   On: 04/08/2020 14:51    Procedures Procedures (including critical care time)  Medications Ordered in ED Medications  levETIRAcetam (KEPPRA) IVPB 1000 mg/100 mL premix (0 mg Intravenous Stopped 04/08/20 1428)  acetaminophen (TYLENOL) tablet 650 mg (650 mg Oral Given 04/08/20 1450)    ED Course  I have reviewed the triage vital signs and the nursing notes.  Pertinent labs & imaging results that were available during my care of the patient were reviewed by me and considered in my medical decision making (see chart for details).  Clinical Course as of Apr 08 1550  Wed Apr 08, 2020  1522 Head CT without acute findings   [JK]  1523 Laboratory tests notable for mild increase in LFTs otherwise no significant abnormalities   [JK]  1542 CT scan without acute findings   [JK]  1549 Mental status is back to baseline.  Patient is alert and oriented to person place and time.   [JK]    Clinical Course User Index [JK] Dorie Rank, MD   MDM Rules/Calculators/A&P                          Patient presented to the ED for evaluation after a seizure.  Patient does have history of colon cancer.  He also has  history of seizure disorder.  Patient's ED work-up is reassuring.  No signs of any electrolyte abnormalities.  No acute findings on head CT.  Patient is now back to baseline.  He has not had any recurrent seizures.  I did send off a Keppra level and will have him follow-up with his neurologist to review his results.  Warning signs and precautions discussed. Final Clinical Impression(s) / ED Diagnoses Final diagnoses:  Seizure [facility])    Rx / DC Orders ED Discharge Orders    None       Dorie Rank, MD 04/08/20 1551

## 2020-04-13 ENCOUNTER — Telehealth: Payer: Self-pay | Admitting: Neurology

## 2020-04-13 NOTE — Telephone Encounter (Signed)
Patient's wife called in and wanted to let Dr. Delice Lesch know the patient went to the ED on 04/08/20 after having 2 seizures. The ED increased his Keppra to 750mg .

## 2020-04-13 NOTE — Telephone Encounter (Signed)
Please send in refills for Keppra 750mg  BID and let patient know to proceed with the 24-hour EEG scheduled in Dec. No driving until  61mos seizure-free. Thanks

## 2020-04-14 MED ORDER — LEVETIRACETAM 750 MG PO TABS
750.0000 mg | ORAL_TABLET | Freq: Two times a day (BID) | ORAL | 3 refills | Status: DC
Start: 2020-04-14 — End: 2020-07-06

## 2020-04-14 NOTE — Telephone Encounter (Signed)
Spoke to pt wife informed her that refill for keppra 750 mg BID was called in to pharmacy on file, to proceed with 24 hour EEG as scheduled in [place] and no driving until 6 month seizure free,

## 2020-04-14 NOTE — Addendum Note (Signed)
Addended by: Jake Seats on: 04/14/2020 10:02 AM   Modules accepted: Orders

## 2020-04-14 NOTE — Telephone Encounter (Signed)
Pt called no answer left voice mail for pt to call back

## 2020-04-16 LAB — LEVETIRACETAM LEVEL: Levetiracetam Lvl: <1 ug/mL — ABNORMAL LOW (ref 10.0–40.0)

## 2020-04-23 ENCOUNTER — Inpatient Hospital Stay (HOSPITAL_BASED_OUTPATIENT_CLINIC_OR_DEPARTMENT_OTHER): Payer: Medicaid Other | Admitting: Oncology

## 2020-04-23 ENCOUNTER — Inpatient Hospital Stay: Payer: Medicaid Other | Attending: Oncology

## 2020-04-23 ENCOUNTER — Inpatient Hospital Stay: Payer: Medicaid Other

## 2020-04-23 ENCOUNTER — Other Ambulatory Visit: Payer: Self-pay

## 2020-04-23 DIAGNOSIS — C187 Malignant neoplasm of sigmoid colon: Secondary | ICD-10-CM | POA: Diagnosis not present

## 2020-04-23 DIAGNOSIS — C787 Secondary malignant neoplasm of liver and intrahepatic bile duct: Secondary | ICD-10-CM | POA: Insufficient documentation

## 2020-04-23 DIAGNOSIS — C189 Malignant neoplasm of colon, unspecified: Secondary | ICD-10-CM

## 2020-04-23 DIAGNOSIS — C7802 Secondary malignant neoplasm of left lung: Secondary | ICD-10-CM | POA: Diagnosis not present

## 2020-04-23 DIAGNOSIS — Z95828 Presence of other vascular implants and grafts: Secondary | ICD-10-CM

## 2020-04-23 DIAGNOSIS — Z5111 Encounter for antineoplastic chemotherapy: Secondary | ICD-10-CM | POA: Insufficient documentation

## 2020-04-23 DIAGNOSIS — C7801 Secondary malignant neoplasm of right lung: Secondary | ICD-10-CM | POA: Insufficient documentation

## 2020-04-23 DIAGNOSIS — D709 Neutropenia, unspecified: Secondary | ICD-10-CM | POA: Diagnosis not present

## 2020-04-23 DIAGNOSIS — Z5189 Encounter for other specified aftercare: Secondary | ICD-10-CM | POA: Diagnosis not present

## 2020-04-23 DIAGNOSIS — Z5112 Encounter for antineoplastic immunotherapy: Secondary | ICD-10-CM | POA: Diagnosis present

## 2020-04-23 LAB — CMP (CANCER CENTER ONLY)
ALT: 57 U/L — ABNORMAL HIGH (ref 0–44)
AST: 40 U/L (ref 15–41)
Albumin: 3.9 g/dL (ref 3.5–5.0)
Alkaline Phosphatase: 95 U/L (ref 38–126)
Anion gap: 14 (ref 5–15)
BUN: 12 mg/dL (ref 6–20)
CO2: 24 mmol/L (ref 22–32)
Calcium: 9.7 mg/dL (ref 8.9–10.3)
Chloride: 101 mmol/L (ref 98–111)
Creatinine: 1.18 mg/dL (ref 0.61–1.24)
GFR, Estimated: >60 mL/min (ref >60)
Glucose, Bld: 205 mg/dL — ABNORMAL HIGH (ref 70–99)
Potassium: 3.9 mmol/L (ref 3.5–5.1)
Sodium: 139 mmol/L (ref 135–145)
Total Bilirubin: 0.5 mg/dL (ref 0.3–1.2)
Total Protein: 8.4 g/dL — ABNORMAL HIGH (ref 6.5–8.1)

## 2020-04-23 LAB — CBC WITH DIFFERENTIAL (CANCER CENTER ONLY)
Abs Immature Granulocytes: 0.09 10*3/uL — ABNORMAL HIGH (ref 0.00–0.07)
Basophils Absolute: 0 10*3/uL (ref 0.0–0.1)
Basophils Relative: 1 %
Eosinophils Absolute: 0.2 10*3/uL (ref 0.0–0.5)
Eosinophils Relative: 3 %
HCT: 48 % (ref 39.0–52.0)
Hemoglobin: 14.8 g/dL (ref 13.0–17.0)
Immature Granulocytes: 2 %
Lymphocytes Relative: 56 %
Lymphs Abs: 3.4 10*3/uL (ref 0.7–4.0)
MCH: 26.8 pg (ref 26.0–34.0)
MCHC: 30.8 g/dL (ref 30.0–36.0)
MCV: 87 fL (ref 80.0–100.0)
Monocytes Absolute: 0.9 10*3/uL (ref 0.1–1.0)
Monocytes Relative: 16 %
Neutro Abs: 1.3 10*3/uL — ABNORMAL LOW (ref 1.7–7.7)
Neutrophils Relative %: 22 %
Platelet Count: 200 10*3/uL (ref 150–400)
RBC: 5.52 MIL/uL (ref 4.22–5.81)
RDW: 15.9 % — ABNORMAL HIGH (ref 11.5–15.5)
WBC Count: 5.9 10*3/uL (ref 4.0–10.5)
nRBC: 0 % (ref 0.0–0.2)

## 2020-04-23 LAB — TOTAL PROTEIN, URINE DIPSTICK: Protein, ur: NEGATIVE mg/dL

## 2020-04-23 MED ORDER — DEXAMETHASONE SODIUM PHOSPHATE 10 MG/ML IJ SOLN
5.0000 mg | Freq: Once | INTRAMUSCULAR | Status: AC
  Administered 2020-04-23: 5 mg via INTRAVENOUS

## 2020-04-23 MED ORDER — SODIUM CHLORIDE 0.9% FLUSH
10.0000 mL | INTRAVENOUS | Status: DC | PRN
  Filled 2020-04-23: qty 10

## 2020-04-23 MED ORDER — PALONOSETRON HCL INJECTION 0.25 MG/5ML
INTRAVENOUS | Status: AC
  Filled 2020-04-23: qty 5

## 2020-04-23 MED ORDER — LIDOCAINE-PRILOCAINE 2.5-2.5 % EX CREA: 1.0000 "application " | TOPICAL_CREAM | CUTANEOUS | 3 refills | Status: DC | PRN

## 2020-04-23 MED ORDER — ATROPINE SULFATE 0.4 MG/ML IJ SOLN
0.4000 mg | Freq: Once | INTRAMUSCULAR | Status: AC
  Administered 2020-04-23: 0.4 mg via INTRAVENOUS

## 2020-04-23 MED ORDER — SODIUM CHLORIDE 0.9 % IV SOLN
400.0000 mg/m2 | Freq: Once | INTRAVENOUS | Status: AC
  Administered 2020-04-23: 912 mg via INTRAVENOUS
  Filled 2020-04-23: qty 45.6

## 2020-04-23 MED ORDER — DEXAMETHASONE SODIUM PHOSPHATE 10 MG/ML IJ SOLN
INTRAMUSCULAR | Status: AC
  Filled 2020-04-23: qty 1

## 2020-04-23 MED ORDER — PALONOSETRON HCL INJECTION 0.25 MG/5ML
0.2500 mg | Freq: Once | INTRAVENOUS | Status: AC
  Administered 2020-04-23: 0.25 mg via INTRAVENOUS

## 2020-04-23 MED ORDER — SODIUM CHLORIDE 0.9 % IV SOLN
180.0000 mg/m2 | Freq: Once | INTRAVENOUS | Status: AC
  Administered 2020-04-23: 420 mg via INTRAVENOUS
  Filled 2020-04-23: qty 21

## 2020-04-23 MED ORDER — ATROPINE SULFATE 0.4 MG/ML IJ SOLN
INTRAMUSCULAR | Status: AC
  Filled 2020-04-23: qty 1

## 2020-04-23 MED ORDER — SODIUM CHLORIDE 0.9 % IV SOLN
Freq: Once | INTRAVENOUS | Status: AC
  Filled 2020-04-23: qty 250

## 2020-04-23 MED ORDER — SODIUM CHLORIDE 0.9 % IV SOLN
7.5000 mg/kg | Freq: Once | INTRAVENOUS | Status: AC
  Administered 2020-04-23: 800 mg via INTRAVENOUS
  Filled 2020-04-23: qty 32

## 2020-04-23 MED ORDER — HEPARIN SOD (PORK) LOCK FLUSH 100 UNIT/ML IV SOLN
500.0000 [IU] | Freq: Once | INTRAVENOUS | Status: DC | PRN
  Filled 2020-04-23: qty 5

## 2020-04-23 MED ORDER — FLUOROURACIL CHEMO INJECTION 2.5 GM/50ML
400.0000 mg/m2 | Freq: Once | INTRAVENOUS | Status: AC
  Administered 2020-04-23: 900 mg via INTRAVENOUS
  Filled 2020-04-23: qty 18

## 2020-04-23 MED ORDER — SODIUM CHLORIDE 0.9% FLUSH
10.0000 mL | INTRAVENOUS | Status: DC | PRN
  Administered 2020-04-23: 10 mL via INTRAVENOUS
  Filled 2020-04-23: qty 10

## 2020-04-23 MED ORDER — SODIUM CHLORIDE 0.9 % IV SOLN
2000.0000 mg/m2 | INTRAVENOUS | Status: DC
  Administered 2020-04-23: 4550 mg via INTRAVENOUS
  Filled 2020-04-23: qty 91

## 2020-04-23 MED ORDER — SODIUM CHLORIDE 0.9 % IV SOLN
150.0000 mg | Freq: Once | INTRAVENOUS | Status: AC
  Administered 2020-04-23: 150 mg via INTRAVENOUS
  Filled 2020-04-23: qty 150

## 2020-04-23 NOTE — Patient Instructions (Signed)
[facility] Discharge Instructions for Patients Receiving Chemotherapy  Today you received the following chemotherapy agents: bevacizumab/irinotecan/leucovorin/fluorouracil.  To help prevent nausea and vomiting after your treatment, we encourage you to take your nausea medication as directed.   If you develop nausea and vomiting that is not controlled by your nausea medication, call the clinic.   BELOW ARE SYMPTOMS THAT SHOULD BE REPORTED IMMEDIATELY:  *FEVER GREATER THAN 100.5 F  *CHILLS WITH OR WITHOUT FEVER  NAUSEA AND VOMITING THAT IS NOT CONTROLLED WITH YOUR NAUSEA MEDICATION  *UNUSUAL SHORTNESS OF BREATH  *UNUSUAL BRUISING OR BLEEDING  TENDERNESS IN MOUTH AND THROAT WITH OR WITHOUT PRESENCE OF ULCERS  *URINARY PROBLEMS  *BOWEL PROBLEMS  UNUSUAL RASH Items with * indicate a potential emergency and should be followed up as soon as possible.  Feel free to call the clinic should you have any questions or concerns. The clinic phone number is [phone].  Please show the CHEMO ALERT CARD at check-in to the Emergency Department and triage nurse.   

## 2020-04-23 NOTE — Progress Notes (Signed)
[facility] OFFICE PROGRESS NOTE   Diagnosis: Colon cancer  INTERVAL HISTORY:   Mr. Vanblarcom completed another cycle FOLFIRI/Avastin on 04/02/2020. No nausea/vomiting, mouth sores, or diarrhea. He had a seizure on 04/08/2020. He was seen in the emergency room. A CT showed no acute change. He reports having a recurrent seizure later that day. He was evaluated at [facility] He reports another scan was negative. The Keppra dose was increased and he is scheduled for an EEG next week. No further seizures.  Objective:  Vital signs in last 24 hours:  Blood pressure (!) 142/86, pulse (!) 109, temperature 98.4 F (36.9 C), temperature source Tympanic, resp. rate 19, height 5\' 10"  (1.778 m), weight 236 lb 3.2 oz (107.1 kg), SpO2 100 %.    HEENT: No thrush or ulcers Resp: Lungs clear bilaterally Cardio: Regular rate and rhythm GI: No hepatosplenomegaly, nontender Vascular: No leg edema Neuro: Alert and oriented    Portacath/PICC-without erythema  Lab Results:  Lab Results  Component Value Date   WBC 5.9 04/23/2020   HGB 14.8 04/23/2020   HCT 48.0 04/23/2020   MCV 87.0 04/23/2020   PLT 200 04/23/2020   NEUTROABS 1.3 (L) 04/23/2020    CMP  Lab Results  Component Value Date   NA 139 04/23/2020   K 3.9 04/23/2020   CL 101 04/23/2020   CO2 24 04/23/2020   GLUCOSE 205 (H) 04/23/2020   BUN 12 04/23/2020   CREATININE 1.18 04/23/2020   CALCIUM 9.7 04/23/2020   PROT 8.4 (H) 04/23/2020   ALBUMIN 3.9 04/23/2020   AST 40 04/23/2020   ALT 57 (H) 04/23/2020   ALKPHOS 95 04/23/2020   BILITOT 0.5 04/23/2020   GFRNONAA >60 04/23/2020   GFRAA >60 01/30/2020    Lab Results  Component Value Date   CEA1 4.68 04/23/2020     Medications: I have reviewed the patient's current medications.   Assessment/Plan:  1. 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 CTschest,  abdomen, and pelvis on 12/11/2015-indeterminate tiny pulmonary nodules, multiple liver metastases, small nodes in the sigmoid mesocolon  3. Laparoscopic sigmoid colectomy, liver biopsy, Port-A-Cath placement 01/14/2016  4. Pathology sigmoid colon resection-colonic adenocarcinoma, 5.3 cm extending into pericolonic connective tissue, positive lymph-vascular invasion, positive perineural invasion, negative margins, metastatic carcinoma in9 of 28 lymph nodes  5. Pathology liver biopsy-metastatic colorectal adenocarcinoma  6. MSI stable; mismatch repair protein normal  7. 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 Cycle 1 FOLFOX 02/02/2016  9. Cycle 2 FOLFOX 02/15/2016  10. Cycle 3 FOLFOX 02/29/2016  11. Cycle 4 FOLFOX 03/14/2016  12. Cycle 5 FOLFOX 03/28/2016  13. Cycle 6 FOLFOX 04/11/2016 (oxaliplatin held secondary to thrombocytopenia)  14. 04/21/2016 restaging CTs-difficulty evaluating liver lesions due to hepatic steatosis. Stable right adrenal nodule. No adenopathy or local recurrence near the rectosigmoid anastomotic site.  15. Cycle 7 FOLFOX 04/25/2016  16. MRI liver 05/02/2016-partial improvement in hepatic metastases  17. Cycle 8 FOLFOX 05/10/2016  18. Cycle 9 FOLFOX 05/23/2016 (oxaliplatin held due to thrombocytopenia)  19. Cycle 10 FOLFOX 06/06/2016 (oxaliplatin dose reduced due to thrombocytopenia)  20. Cycle 11 FOLFOX 06/27/2016 (oxaliplatin held due to neuropathy)  21. Cycle 12 FOLFOX 07/11/2016 (oxaliplatin held) 22. Initiation of maintenance Xeloda 7 days on/7 days off 07/27/2016 23. MRI liver 11/18/2016-decrease in hepatic metastatic disease. No new or progressive disease identified within the abdomen. 24. Continuation of Xeloda 7 days on/7 days off 25. MRI  liver 04/27/2017-previous liver lesions not identified, no new lesions, no lymphadenopathy 26. Xeloda continued 7 days on/7 days off 27. MRI liver 12/04/2017 -no evidence of metastatic disease, hepatic  steatosis 28. Xeloda continued 7 days on/7 days off 29. MRI liver 07/15/2018-no evidence of metastatic disease. Stable severe hepatic steatosis. 30. Xeloda continued 7 days on/7 days off 31. MRI liver 03/16/2019-hepatic steatosis, no liver mass, focal area of intrahepatic biliary dilatation in segments 2 and 3 of the left lobe-increased 32. Xeloda continued 7 days on/7 days off 33. MRI abdomen 08/19/2019-no findings to suggest liver metastases. Bilateral lung nodules measuring up to 2.8 cm, progressive and more conspicuous than on previous exam 34. CT chest 08/29/2019-multiple pulmonary metastases, new from 04/21/2016 35. Cycle 1 FOLFIRI/bevacizumab 09/09/2019 36. Cycle 2 FOLFIRI/bevacizumab 09/26/2019 37. Cycle 3 FOLFIRI/bevacizumab 10/10/2019 38. Cycle 4 FOLFIRI/bevacizumab 10/24/2019 39. Cycle 5 FOLFIRI/bevacizumab 11/07/2019 40. CT chest 11/14/2019-decreased size of lung nodules, no new lesions, hepatic steatosis 41. Cycle 6 FOLFIRI/bevacizumab 11/21/2019 42. Cycle 7 FOLFIRI/bevacizumab 12/05/2019 43. Cycle 8 FOLFIRI/bevacizumab 12/19/2019 44. Cycle 9 FOLFIRI/bevacizumab 01/02/2020 45. Cycle 10 FOLFIRI/bevacizumab 01/16/2020 46. CT chest 01/29/2020-stable bilateral pulmonary metastases. No new or progressive metastatic disease in the chest. 47. Cycle 11 FOLFIRI/bevacizumab 01/30/2020 48. Cycle 12 FOLFIRI/bevacizumab 02/19/2020 49. Cycle 13 FOLFIRI/bevacizumab 03/12/2020 50. Cycle 14 FOLFIRI/bevacizumab 04/02/2020 51. Cycle 15 FOLFIRI/bevacizumab 04/23/2020  2. Rectal bleeding and constipation secondary to #1  3. History of peripheral vascular disease, status post left lower extremity vascular bypass surgery in April 2017  4. History of nephrolithiasis  5. History of Graves' disease treated with radioactive iodine  6. Anxiety/depression  7. Hypertension  8. Hospitalization 01/19/2016 with wound dehiscence status post secondary suture closure of abdominal wall  9.  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 Hyperglycemia 06/20/2016-diagnosed with diabetes, maintained on insulin  11.Positive COVID test 12/13/2018  12. Hospitalized with seizure activity/DKA. Now on Keppra, insulin adjusted. 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  Recurrent seizures 05/08/2020-Keppra dose increased, CT brain without acute change   Disposition: Mr. Creary has metastatic colon cancer. He continues treatment with FOLFIRI/bevacizumab. He appears to be tolerating the treatment well. He has mild neutropenia today. He knows to call for a fever or symptoms of an infection. We discussed delaying treatment versus adding G-CSF. He agrees to proceed with G-CSF support.  Mr. Rabinovich will follow up with neurology for management of the seizure disorder. The seizures appear unrelated to the diagnosis of colon cancer.  Mr. Grabill will return for an office visit and chemotherapy on 05/22/2019. He will return for a CBC on 05/06/2020. He plans to travel to [place] for the holidays.  Betsy Coder, MD  04/23/2020  4:42 PM

## 2020-04-23 NOTE — Progress Notes (Signed)
Per Dr. Benay Spice: OK to treat w/ANC 1.3. Will start Udenyca on day 3. Also OK to treat w/pulse 109

## 2020-04-25 ENCOUNTER — Inpatient Hospital Stay: Payer: Medicaid Other

## 2020-04-25 ENCOUNTER — Other Ambulatory Visit: Payer: Self-pay

## 2020-04-25 VITALS — BP 147/102 | HR 101 | Temp 97.4°F | Resp 18

## 2020-04-25 DIAGNOSIS — C187 Malignant neoplasm of sigmoid colon: Secondary | ICD-10-CM

## 2020-04-25 DIAGNOSIS — Z5112 Encounter for antineoplastic immunotherapy: Secondary | ICD-10-CM | POA: Diagnosis not present

## 2020-04-25 MED ORDER — PEGFILGRASTIM-CBQV 6 MG/0.6ML ~~LOC~~ SOSY
6.0000 mg | PREFILLED_SYRINGE | Freq: Once | SUBCUTANEOUS | Status: AC
  Administered 2020-04-25: 13:00:00 6 mg via SUBCUTANEOUS

## 2020-04-25 MED ORDER — SODIUM CHLORIDE 0.9% FLUSH
10.0000 mL | INTRAVENOUS | Status: DC | PRN
  Administered 2020-04-25: 13:00:00 10 mL
  Filled 2020-04-25: qty 10

## 2020-04-25 MED ORDER — HEPARIN SOD (PORK) LOCK FLUSH 100 UNIT/ML IV SOLN
500.0000 [IU] | Freq: Once | INTRAVENOUS | Status: AC | PRN
  Administered 2020-04-25: 13:00:00 500 [IU]
  Filled 2020-04-25: qty 5

## 2020-04-25 NOTE — Patient Instructions (Addendum)
Pegfilgrastim injection What is this medicine? PEGFILGRASTIM (PEG fil gra stim) is a long-acting granulocyte colony-stimulating factor that stimulates the growth of neutrophils, a type of white blood cell important in the body's fight against infection. It is used to reduce the incidence of fever and infection in patients with certain types of cancer who are receiving chemotherapy that affects the bone marrow, and to increase survival after being exposed to high doses of radiation. This medicine may be used for other purposes; ask your health care provider or pharmacist if you have questions. COMMON BRAND NAME(S): Fulphila, Neulasta, UDENYCA, Ziextenzo What should I tell my health care provider before I take this medicine? They need to know if you have any of these conditions:  kidney disease  latex allergy  ongoing radiation therapy  sickle cell disease  skin reactions to acrylic adhesives (On-Body Injector only)  an unusual or allergic reaction to pegfilgrastim, filgrastim, other medicines, foods, dyes, or preservatives  pregnant or trying to get pregnant  breast-feeding How should I use this medicine? This medicine is for injection under the skin. If you get this medicine at home, you will be taught how to prepare and give the pre-filled syringe or how to use the On-body Injector. Refer to the patient Instructions for Use for detailed instructions. Use exactly as directed. Tell your healthcare provider immediately if you suspect that the On-body Injector may not have performed as intended or if you suspect the use of the On-body Injector resulted in a missed or partial dose. It is important that you put your used needles and syringes in a special sharps container. Do not put them in a trash can. If you do not have a sharps container, call your pharmacist or healthcare provider to get one. Talk to your pediatrician regarding the use of this medicine in children. While this drug may be  prescribed for selected conditions, precautions do apply. Overdosage: If you think you have taken too much of this medicine contact a poison control center or emergency room at once. NOTE: This medicine is only for you. Do not share this medicine with others. What if I miss a dose? It is important not to miss your dose. Call your doctor or health care professional if you miss your dose. If you miss a dose due to an On-body Injector failure or leakage, a new dose should be administered as soon as possible using a single prefilled syringe for manual use. What may interact with this medicine? Interactions have not been studied. Give your health care provider a list of all the medicines, herbs, non-prescription drugs, or dietary supplements you use. Also tell them if you smoke, drink alcohol, or use illegal drugs. Some items may interact with your medicine. This list may not describe all possible interactions. Give your health care provider a list of all the medicines, herbs, non-prescription drugs, or dietary supplements you use. Also tell them if you smoke, drink alcohol, or use illegal drugs. Some items may interact with your medicine. What should I watch for while using this medicine? You may need blood work done while you are taking this medicine. If you are going to need a MRI, CT scan, or other procedure, tell your doctor that you are using this medicine (On-Body Injector only). What side effects may I notice from receiving this medicine? Side effects that you should report to your doctor or health care professional as soon as possible:  allergic reactions like skin rash, itching or hives, swelling of the   face, lips, or tongue  back pain  dizziness  fever  pain, redness, or irritation at site where injected  pinpoint red spots on the skin  red or dark-brown urine  shortness of breath or breathing problems  stomach or side pain, or pain at the  shoulder  swelling  tiredness  trouble passing urine or change in the amount of urine Side effects that usually do not require medical attention (report to your doctor or health care professional if they continue or are bothersome):  bone pain  muscle pain This list may not describe all possible side effects. Call your doctor for medical advice about side effects. You may report side effects to FDA at 1-800-FDA-1088. Where should I keep my medicine? Keep out of the reach of children. If you are using this medicine at home, you will be instructed on how to store it. Throw away any unused medicine after the expiration date on the label. NOTE: This sheet is a summary. It may not cover all possible information. If you have questions about this medicine, talk to your doctor, pharmacist, or health care provider.  2020 Elsevier/Gold Standard (2017-08-07 16:57:08) Implanted Port Home Guide An implanted port is a device that is placed under the skin. It is usually placed in the chest. The device can be used to give IV medicine, to take blood, or for dialysis. You may have an implanted port if:  You need IV medicine that would be irritating to the small veins in your hands or arms.  You need IV medicines, such as antibiotics, for a long period of time.  You need IV nutrition for a long period of time.  You need dialysis. Having a port means that your health care provider will not need to use the veins in your arms for these procedures. You may have fewer limitations when using a port than you would if you used other types of long-term IVs, and you will likely be able to return to normal activities after your incision heals. An implanted port has two main parts:  Reservoir. The reservoir is the part where a needle is inserted to give medicines or draw blood. The reservoir is round. After it is placed, it appears as a small, raised area under your skin.  Catheter. The catheter is a thin,  flexible tube that connects the reservoir to a vein. Medicine that is inserted into the reservoir goes into the catheter and then into the vein. How is my port accessed? To access your port:  A numbing cream may be placed on the skin over the port site.  Your health care provider will put on a mask and sterile gloves.  The skin over your port will be cleaned carefully with a germ-killing soap and allowed to dry.  Your health care provider will gently pinch the port and insert a needle into it.  Your health care provider will check for a blood return to make sure the port is in the vein and is not clogged.  If your port needs to remain accessed to get medicine continuously (constant infusion), your health care provider will place a clear bandage (dressing) over the needle site. The dressing and needle will need to be changed every week, or as told by your health care provider. What is flushing? Flushing helps keep the port from getting clogged. Follow instructions from your health care provider about how and when to flush the port. Ports are usually flushed with saline solution or a medicine   called heparin. The need for flushing will depend on how the port is used:  If the port is only used from time to time to give medicines or draw blood, the port may need to be flushed: ? Before and after medicines have been given. ? Before and after blood has been drawn. ? As part of routine maintenance. Flushing may be recommended every 4-6 weeks.  If a constant infusion is running, the port may not need to be flushed.  Throw away any syringes in a disposal container that is meant for sharp items (sharps container). You can buy a sharps container from a pharmacy, or you can make one by using an empty hard plastic bottle with a cover. How long will my port stay implanted? The port can stay in for as long as your health care provider thinks it is needed. When it is time for the port to come out, a  surgery will be done to remove it. The surgery will be similar to the procedure that was done to put the port in. Follow these instructions at home:   Flush your port as told by your health care provider.  If you need an infusion over several days, follow instructions from your health care provider about how to take care of your port site. Make sure you: ? Wash your hands with soap and water before you change your dressing. If soap and water are not available, use alcohol-based hand sanitizer. ? Change your dressing as told by your health care provider. ? Place any used dressings or infusion bags into a plastic bag. Throw that bag in the trash. ? Keep the dressing that covers the needle clean and dry. Do not get it wet. ? Do not use scissors or sharp objects near the tube. ? Keep the tube clamped, unless it is being used.  Check your port site every day for signs of infection. Check for: ? Redness, swelling, or pain. ? Fluid or blood. ? Pus or a bad smell.  Protect the skin around the port site. ? Avoid wearing bra straps that rub or irritate the site. ? Protect the skin around your port from seat belts. Place a soft pad over your chest if needed.  Bathe or shower as told by your health care provider. The site may get wet as long as you are not actively receiving an infusion.  Return to your normal activities as told by your health care provider. Ask your health care provider what activities are safe for you.  Carry a medical alert card or wear a medical alert bracelet at all times. This will let health care providers know that you have an implanted port in case of an emergency. Get help right away if:  You have redness, swelling, or pain at the port site.  You have fluid or blood coming from your port site.  You have pus or a bad smell coming from the port site.  You have a fever. Summary  Implanted ports are usually placed in the chest for long-term IV access.  Follow  instructions from your health care provider about flushing the port and changing bandages (dressings).  Take care of the area around your port by avoiding clothing that puts pressure on the area, and by watching for signs of infection.  Protect the skin around your port from seat belts. Place a soft pad over your chest if needed.  Get help right away if you have a fever or you have redness,   swelling, pain, drainage, or a bad smell at the port site. This information is not intended to replace advice given to you by your health care provider. Make sure you discuss any questions you have with your health care provider. Document Revised: 08/24/2018 Document Reviewed: 06/04/2016 Elsevier Patient Education  2020 Elsevier Inc.  

## 2020-04-27 ENCOUNTER — Ambulatory Visit (INDEPENDENT_AMBULATORY_CARE_PROVIDER_SITE_OTHER): Payer: Medicaid Other | Admitting: Neurology

## 2020-04-27 ENCOUNTER — Other Ambulatory Visit: Payer: Self-pay

## 2020-04-27 DIAGNOSIS — G40009 Localization-related (focal) (partial) idiopathic epilepsy and epileptic syndromes with seizures of localized onset, not intractable, without status epilepticus: Secondary | ICD-10-CM

## 2020-04-27 LAB — CEA (IN HOUSE-CHCC): CEA (CHCC-In House): 4.68 ng/mL (ref 0.00–5.00)

## 2020-05-06 ENCOUNTER — Inpatient Hospital Stay: Payer: Medicaid Other

## 2020-05-06 ENCOUNTER — Other Ambulatory Visit: Payer: Self-pay

## 2020-05-06 ENCOUNTER — Telehealth: Payer: Self-pay | Admitting: *Deleted

## 2020-05-06 DIAGNOSIS — Z5112 Encounter for antineoplastic immunotherapy: Secondary | ICD-10-CM | POA: Diagnosis not present

## 2020-05-06 DIAGNOSIS — C187 Malignant neoplasm of sigmoid colon: Secondary | ICD-10-CM

## 2020-05-06 LAB — CBC WITH DIFFERENTIAL (CANCER CENTER ONLY)
Abs Immature Granulocytes: 0.28 10*3/uL — ABNORMAL HIGH (ref 0.00–0.07)
Basophils Absolute: 0 10*3/uL (ref 0.0–0.1)
Basophils Relative: 0 %
Eosinophils Absolute: 0.2 10*3/uL (ref 0.0–0.5)
Eosinophils Relative: 1 %
HCT: 47 % (ref 39.0–52.0)
Hemoglobin: 14.3 g/dL (ref 13.0–17.0)
Immature Granulocytes: 3 %
Lymphocytes Relative: 31 %
Lymphs Abs: 3.5 10*3/uL (ref 0.7–4.0)
MCH: 26.9 pg (ref 26.0–34.0)
MCHC: 30.4 g/dL (ref 30.0–36.0)
MCV: 88.3 fL (ref 80.0–100.0)
Monocytes Absolute: 0.8 10*3/uL (ref 0.1–1.0)
Monocytes Relative: 7 %
Neutro Abs: 6.6 10*3/uL (ref 1.7–7.7)
Neutrophils Relative %: 58 %
Platelet Count: 116 10*3/uL — ABNORMAL LOW (ref 150–400)
RBC: 5.32 MIL/uL (ref 4.22–5.81)
RDW: 16 % — ABNORMAL HIGH (ref 11.5–15.5)
WBC Count: 11.3 10*3/uL — ABNORMAL HIGH (ref 4.0–10.5)
nRBC: 0 % (ref 0.0–0.2)

## 2020-05-06 NOTE — Telephone Encounter (Signed)
Notified of CBC results. OK to travel per Dr. Benay Spice.

## 2020-05-10 ENCOUNTER — Other Ambulatory Visit: Payer: Self-pay | Admitting: Oncology

## 2020-05-11 ENCOUNTER — Other Ambulatory Visit: Payer: Self-pay | Admitting: Cardiovascular Disease

## 2020-05-11 ENCOUNTER — Telehealth: Payer: Self-pay | Admitting: Neurology

## 2020-05-11 DIAGNOSIS — I10 Essential (primary) hypertension: Secondary | ICD-10-CM

## 2020-05-11 NOTE — Telephone Encounter (Signed)
Left second voicemail on 12/27 at 4:35 pm to discuss EEG results, asked to call office back.

## 2020-05-11 NOTE — Telephone Encounter (Signed)
Refill Request.  

## 2020-05-11 NOTE — Telephone Encounter (Signed)
Left VM to discuss EEG results. 

## 2020-05-11 NOTE — Procedures (Signed)
ELECTROENCEPHALOGRAM REPORT  Dates of Recording: 04/27/2020 10:08AM to 04/28/2020 10:10AM Patient's Name: Jeff [name] MRN: [id number] Date of Birth: 06-11-1965  Referring Provider: Dr. Patrcia Dolly  Procedure: 24-hour ambulatory video EEG  History: This is a 54 year old man with new onset seizure in June 2021 in the setting of significant hyperglycemia. Prior EEG reported right frontocentral spikes.   Medications:  KEPPRA 500 MG tablet NORVASC 5 MG tablet aspirin EC 81 MG tablet LIPITOR 80 MG tablet FARXIGA 5 MG TABS tablet COLACE 100 MG capsule NOVOLOG 100 UNIT/ML injection TRESIBA FLEXTOUCH [place] SYNTHROID 150 MCG tablet EMLA cream  ATIVAN 0.5 MG tablet ANTIVERT 25 MG tablet GLUCOPHAGE-XR 500 MG 24 hr tablet LOPRESSOR 50 MG tablet ZYPREXA 5 MG tablet ZOFRAN 8 MG tablet PERCOCET 10-325 MG tablet PROTONIX 40 MG tablet PHENERGAN 12.5 MG tablet FLOMAX 0.4 MG CAPS capsule EFFEXOR-XR 75 MG 24 hr capsule   Technical Summary: This is a 24-hour multichannel digital video EEG recording measured by the international 10-20 system with electrodes applied with paste and impedances below 5000 ohms performed as portable with EKG monitoring.  The digital EEG was referentially recorded, reformatted, and digitally filtered in a variety of bipolar and referential montages for optimal display.    DESCRIPTION OF RECORDING: During maximal wakefulness, the background activity consisted of a symmetric 10 Hz posterior dominant rhythm which was reactive to eye opening.  There were occasional generalized high voltage 4-5 Hz irregular spike and wave discharges lasting 0.5-2 seconds were seen in wakefulness  During the recording, the patient progresses through wakefulness, drowsiness, and Stage 2 sleep.  Similar generalized 4-5 Hz spike and wave discharges were seen in drowsiness.    Events: He reports feeling a little dizzy on 12/13 but did not note time. There were 3 push button events on 1016,  1149, and 1258 hours with no video recorded, no EEG changes seen.   There were no electrographic seizures seen.  EKG lead showed sinus tachycardia up to 120 bpm.   IMPRESSION: This 24-hour ambulatory video EEG study is abnormal due to the presence of occasional generalized 4-5 Hz spike and wave discharges.   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 There were no electrographic seizures seen in this study. If further clinical questions remain, inpatient video EEG monitoring may be helpful.   Patrcia Dolly, M.D.

## 2020-05-12 NOTE — Telephone Encounter (Signed)
Spoke to patient and wife about abnormal EEG and need to continue on Keppra long term. He has not had any seizures, no side effects. Avoidance of seizure triggers discussed, as well as indications to call EMS. F/u as scheduled in Feb, all their questions were answered.

## 2020-05-12 NOTE — Telephone Encounter (Signed)
Patient's wife states she was returning a call from Dr Karel Jarvis about her husbands EEG results. Please call.

## 2020-05-17 ENCOUNTER — Other Ambulatory Visit: Payer: Self-pay | Admitting: Oncology

## 2020-05-18 ENCOUNTER — Telehealth: Payer: Self-pay | Admitting: Oncology

## 2020-05-18 NOTE — Telephone Encounter (Signed)
Rescheduled appointments on 1/27 per provider PAL request. Called patient, no answer. Left message with updated appointments times.

## 2020-05-19 ENCOUNTER — Ambulatory Visit (INDEPENDENT_AMBULATORY_CARE_PROVIDER_SITE_OTHER): Payer: Medicaid Other | Admitting: Cardiovascular Disease

## 2020-05-19 ENCOUNTER — Encounter: Payer: Self-pay | Admitting: Cardiovascular Disease

## 2020-05-19 ENCOUNTER — Other Ambulatory Visit: Payer: Self-pay

## 2020-05-19 VITALS — BP 130/92 | HR 97 | Ht 70.0 in | Wt 235.4 lb

## 2020-05-19 DIAGNOSIS — I739 Peripheral vascular disease, unspecified: Secondary | ICD-10-CM | POA: Diagnosis not present

## 2020-05-19 DIAGNOSIS — I10 Essential (primary) hypertension: Secondary | ICD-10-CM

## 2020-05-19 DIAGNOSIS — E785 Hyperlipidemia, unspecified: Secondary | ICD-10-CM | POA: Diagnosis not present

## 2020-05-19 MED ORDER — METOPROLOL TARTRATE 50 MG PO TABS: 50.0000 mg | ORAL_TABLET | Freq: Two times a day (BID) | ORAL | 3 refills | Status: DC

## 2020-05-19 MED ORDER — NITROGLYCERIN 0.4 MG SL SUBL: SUBLINGUAL_TABLET | SUBLINGUAL | 2 refills | Status: DC

## 2020-05-19 NOTE — Patient Instructions (Signed)
Medication Instructions:  INCREASE the Metoprolol Tartrate to 50 mg twice daily  *If you need a refill on your cardiac medications before your next appointment, please call your pharmacy*   Lab Work: None ordered If you have labs (blood work) drawn today and your tests are completely normal, you will receive your results only by: Marland Kitchen MyChart Message (if you have MyChart) OR . A paper copy in the mail If you have any lab test that is abnormal or we need to change your treatment, we will call you to review the results.   Testing/Procedures: Your physician has requested that you have an ankle brachial index (ABI). During this test an ultrasound and blood pressure cuff are used to evaluate the arteries that supply the arms and legs with blood. Allow thirty minutes for this exam. There are no restrictions or special instructions. This will take place at [address] [facility], [address].   Your physician has requested that you have a lower extremity arterial duplex. During this test, ultrasound is used to evaluate arterial blood flow in the legs. Allow one hour for this exam. There are no restrictions or special instructions. This will take place at [address] [facility], [address].    Follow-Up: At [facility], you and your health needs are our priority.  As part of our continuing mission to provide you with exceptional heart care, we have created designated Provider Care Teams.  These Care Teams include your primary Cardiologist (physician) and Advanced Practice Providers (APPs -  Physician Assistants and Nurse Practitioners) who all work together to provide you with the care you need, when you need it.  We recommend signing up for the patient portal called "MyChart".  Sign up information is provided on this After Visit Summary.  MyChart is used to connect with patients for Virtual Visits (Telemedicine).  Patients are able to view lab/test results, encounter notes, upcoming appointments, etc.   Non-urgent messages can be sent to your provider as well.   To learn more about what you can do with MyChart, go to [email or web address].    Your next appointment:   12 month(s)  The format for your next appointment:   In Person  Provider:   Lorine Bears, MD
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
## 2020-05-21 ENCOUNTER — Inpatient Hospital Stay: Payer: Medicaid Other

## 2020-05-21 ENCOUNTER — Other Ambulatory Visit: Payer: Self-pay

## 2020-05-21 ENCOUNTER — Other Ambulatory Visit: Payer: Self-pay | Admitting: Nurse Practitioner

## 2020-05-21 ENCOUNTER — Inpatient Hospital Stay: Payer: Medicaid Other | Attending: Oncology

## 2020-05-21 ENCOUNTER — Inpatient Hospital Stay (HOSPITAL_BASED_OUTPATIENT_CLINIC_OR_DEPARTMENT_OTHER): Payer: Medicaid Other | Admitting: Nurse Practitioner

## 2020-05-21 ENCOUNTER — Telehealth: Payer: Self-pay | Admitting: Nurse Practitioner

## 2020-05-21 ENCOUNTER — Encounter: Payer: Self-pay | Admitting: Nurse Practitioner

## 2020-05-21 VITALS — BP 148/92 | HR 104

## 2020-05-21 VITALS — BP 138/90 | HR 100 | Temp 98.4°F | Resp 18 | Ht 70.0 in | Wt 237.3 lb

## 2020-05-21 DIAGNOSIS — C189 Malignant neoplasm of colon, unspecified: Secondary | ICD-10-CM

## 2020-05-21 DIAGNOSIS — C787 Secondary malignant neoplasm of liver and intrahepatic bile duct: Secondary | ICD-10-CM

## 2020-05-21 DIAGNOSIS — Z5112 Encounter for antineoplastic immunotherapy: Secondary | ICD-10-CM | POA: Diagnosis present

## 2020-05-21 DIAGNOSIS — Z5189 Encounter for other specified aftercare: Secondary | ICD-10-CM | POA: Insufficient documentation

## 2020-05-21 DIAGNOSIS — C187 Malignant neoplasm of sigmoid colon: Secondary | ICD-10-CM | POA: Insufficient documentation

## 2020-05-21 DIAGNOSIS — Z5111 Encounter for antineoplastic chemotherapy: Secondary | ICD-10-CM | POA: Insufficient documentation

## 2020-05-21 DIAGNOSIS — Z95828 Presence of other vascular implants and grafts: Secondary | ICD-10-CM

## 2020-05-21 LAB — CBC WITH DIFFERENTIAL (CANCER CENTER ONLY)
Abs Immature Granulocytes: 0.05 10*3/uL (ref 0.00–0.07)
Basophils Absolute: 0 10*3/uL (ref 0.0–0.1)
Basophils Relative: 0 %
Eosinophils Absolute: 0.2 10*3/uL (ref 0.0–0.5)
Eosinophils Relative: 2 %
HCT: 46.5 % (ref 39.0–52.0)
Hemoglobin: 14.5 g/dL (ref 13.0–17.0)
Immature Granulocytes: 1 %
Lymphocytes Relative: 27 %
Lymphs Abs: 3 10*3/uL (ref 0.7–4.0)
MCH: 27.3 pg (ref 26.0–34.0)
MCHC: 31.2 g/dL (ref 30.0–36.0)
MCV: 87.6 fL (ref 80.0–100.0)
Monocytes Absolute: 1.1 10*3/uL — ABNORMAL HIGH (ref 0.1–1.0)
Monocytes Relative: 10 %
Neutro Abs: 6.6 10*3/uL (ref 1.7–7.7)
Neutrophils Relative %: 60 %
Platelet Count: 172 10*3/uL (ref 150–400)
RBC: 5.31 MIL/uL (ref 4.22–5.81)
RDW: 16 % — ABNORMAL HIGH (ref 11.5–15.5)
WBC Count: 11 10*3/uL — ABNORMAL HIGH (ref 4.0–10.5)
nRBC: 0 % (ref 0.0–0.2)

## 2020-05-21 LAB — CMP (CANCER CENTER ONLY)
ALT: 86 U/L — ABNORMAL HIGH (ref 0–44)
AST: 60 U/L — ABNORMAL HIGH (ref 15–41)
Albumin: 3.8 g/dL (ref 3.5–5.0)
Alkaline Phosphatase: 142 U/L — ABNORMAL HIGH (ref 38–126)
Anion gap: 13 (ref 5–15)
BUN: 7 mg/dL (ref 6–20)
CO2: 22 mmol/L (ref 22–32)
Calcium: 9.4 mg/dL (ref 8.9–10.3)
Chloride: 103 mmol/L (ref 98–111)
Creatinine: 1.12 mg/dL (ref 0.61–1.24)
GFR, Estimated: >60 mL/min (ref >60)
Glucose, Bld: 245 mg/dL — ABNORMAL HIGH (ref 70–99)
Potassium: 3.7 mmol/L (ref 3.5–5.1)
Sodium: 138 mmol/L (ref 135–145)
Total Bilirubin: 0.6 mg/dL (ref 0.3–1.2)
Total Protein: 8 g/dL (ref 6.5–8.1)

## 2020-05-21 LAB — CEA (IN HOUSE-CHCC): CEA (CHCC-In House): 8.85 ng/mL — ABNORMAL HIGH (ref 0.00–5.00)

## 2020-05-21 MED ORDER — SODIUM CHLORIDE 0.9 % IV SOLN
400.0000 mg/m2 | Freq: Once | INTRAVENOUS | Status: AC
  Administered 2020-05-21: 912 mg via INTRAVENOUS
  Filled 2020-05-21: qty 45.6

## 2020-05-21 MED ORDER — DEXAMETHASONE SODIUM PHOSPHATE 10 MG/ML IJ SOLN
5.0000 mg | Freq: Once | INTRAMUSCULAR | Status: AC
  Administered 2020-05-21: 5 mg via INTRAVENOUS

## 2020-05-21 MED ORDER — SODIUM CHLORIDE 0.9% FLUSH
10.0000 mL | INTRAVENOUS | Status: DC | PRN
  Filled 2020-05-21: qty 10

## 2020-05-21 MED ORDER — PALONOSETRON HCL INJECTION 0.25 MG/5ML
0.2500 mg | Freq: Once | INTRAVENOUS | Status: AC
  Administered 2020-05-21: 0.25 mg via INTRAVENOUS

## 2020-05-21 MED ORDER — ATROPINE SULFATE 0.4 MG/ML IJ SOLN
INTRAMUSCULAR | Status: AC
  Filled 2020-05-21: qty 1

## 2020-05-21 MED ORDER — FLUOROURACIL CHEMO INJECTION 2.5 GM/50ML
400.0000 mg/m2 | Freq: Once | INTRAVENOUS | Status: AC
  Administered 2020-05-21: 900 mg via INTRAVENOUS
  Filled 2020-05-21: qty 18

## 2020-05-21 MED ORDER — LORAZEPAM 0.5 MG PO TABS: 0.5000 mg | ORAL_TABLET | Freq: Three times a day (TID) | ORAL | 0 refills | Status: DC | PRN

## 2020-05-21 MED ORDER — SODIUM CHLORIDE 0.9 % IV SOLN
7.5000 mg/kg | Freq: Once | INTRAVENOUS | Status: AC
  Administered 2020-05-21: 800 mg via INTRAVENOUS
  Filled 2020-05-21: qty 32

## 2020-05-21 MED ORDER — SODIUM CHLORIDE 0.9 % IV SOLN
2000.0000 mg/m2 | INTRAVENOUS | Status: DC
  Administered 2020-05-21: 4550 mg via INTRAVENOUS
  Filled 2020-05-21: qty 91

## 2020-05-21 MED ORDER — PALONOSETRON HCL INJECTION 0.25 MG/5ML
INTRAVENOUS | Status: AC
  Filled 2020-05-21: qty 5

## 2020-05-21 MED ORDER — DEXAMETHASONE SODIUM PHOSPHATE 10 MG/ML IJ SOLN
INTRAMUSCULAR | Status: AC
  Filled 2020-05-21: qty 1

## 2020-05-21 MED ORDER — ATROPINE SULFATE 0.4 MG/ML IJ SOLN
0.4000 mg | Freq: Once | INTRAMUSCULAR | Status: AC
  Administered 2020-05-21: 0.4 mg via INTRAVENOUS

## 2020-05-21 MED ORDER — SODIUM CHLORIDE 0.9 % IV SOLN
Freq: Once | INTRAVENOUS | Status: AC
  Filled 2020-05-21: qty 250

## 2020-05-21 MED ORDER — SODIUM CHLORIDE 0.9 % IV SOLN
180.0000 mg/m2 | Freq: Once | INTRAVENOUS | Status: AC
  Administered 2020-05-21: 420 mg via INTRAVENOUS
  Filled 2020-05-21: qty 15

## 2020-05-21 MED ORDER — SODIUM CHLORIDE 0.9% FLUSH
10.0000 mL | INTRAVENOUS | Status: DC | PRN
  Administered 2020-05-21: 10 mL via INTRAVENOUS
  Filled 2020-05-21: qty 10

## 2020-05-21 MED ORDER — SODIUM CHLORIDE 0.9 % IV SOLN
150.0000 mg | Freq: Once | INTRAVENOUS | Status: AC
  Administered 2020-05-21: 150 mg via INTRAVENOUS
  Filled 2020-05-21: qty 150

## 2020-05-21 NOTE — Telephone Encounter (Signed)
Scheduled appointments per 1/6 los. Spoke to patient's wife who is aware of appointments dates and times.

## 2020-05-21 NOTE — Progress Notes (Signed)
Per Bernadene Bell NP, ok to treat with today's lab values.

## 2020-05-21 NOTE — Patient Instructions (Signed)
[facility] Discharge Instructions for Patients Receiving Chemotherapy  Today you received the following chemotherapy agents: bevacizumab/irinotecan/leucovorin/fluorouracil.  To help prevent nausea and vomiting after your treatment, we encourage you to take your nausea medication as directed.   If you develop nausea and vomiting that is not controlled by your nausea medication, call the clinic.   BELOW ARE SYMPTOMS THAT SHOULD BE REPORTED IMMEDIATELY:  *FEVER GREATER THAN 100.5 F  *CHILLS WITH OR WITHOUT FEVER  NAUSEA AND VOMITING THAT IS NOT CONTROLLED WITH YOUR NAUSEA MEDICATION  *UNUSUAL SHORTNESS OF BREATH  *UNUSUAL BRUISING OR BLEEDING  TENDERNESS IN MOUTH AND THROAT WITH OR WITHOUT PRESENCE OF ULCERS  *URINARY PROBLEMS  *BOWEL PROBLEMS  UNUSUAL RASH Items with * indicate a potential emergency and should be followed up as soon as possible.  Feel free to call the clinic should you have any questions or concerns. The clinic phone number is [phone].  Please show the CHEMO ALERT CARD at check-in to the Emergency Department and triage nurse.   

## 2020-05-21 NOTE — Progress Notes (Signed)
[facility] OFFICE PROGRESS NOTE   Diagnosis: Colon cancer  INTERVAL HISTORY:   Jeff [name] returns as scheduled.  He completed a cycle of FOLFIRI/bevacizumab 04/23/2020.  He denies nausea/vomiting.  No mouth sores.  No diarrhea.  He denies bleeding.  No abdominal pain.  No fever, cough, shortness of breath.  No seizure activity.  Blood sugar 164 at home this morning.  Objective:  Vital signs in last 24 hours:  Blood pressure 138/90, pulse 100, temperature 98.4 F (36.9 C), temperature source Tympanic, resp. rate 18, height 5' 10" (1.778 m), weight 237 lb 4.8 oz (107.6 kg), SpO2 100 %.    HEENT: No thrush or ulcers. Resp: Lungs clear bilaterally. Cardio: Regular rate and rhythm. GI: Abdomen soft and nontender.  No hepatomegaly. Vascular: No leg edema. Neuro: Alert and oriented. Skin: No rash. Port-A-Cath without erythema.   Lab Results:  Lab Results  Component Value Date   WBC 11.0 (H) 05/21/2020   HGB 14.5 05/21/2020   HCT 46.5 05/21/2020   MCV 87.6 05/21/2020   PLT 172 05/21/2020   NEUTROABS 6.6 05/21/2020    Imaging:  No results found.  Medications: I have reviewed the patient's current medications.  Assessment/Plan: 1. 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 CTschest, abdomen, and pelvis on 12/11/2015-indeterminate tiny pulmonary nodules, multiple liver metastases, small nodes in the sigmoid mesocolon  3. Laparoscopic sigmoid colectomy, liver biopsy, Port-A-Cath placement 01/14/2016  4. Pathology sigmoid colon resection-colonic adenocarcinoma, 5.3 cm extending into pericolonic connective tissue, positive lymph-vascular invasion, positive perineural invasion, negative margins, metastatic carcinoma in9 of 28 lymph nodes  5. Pathology liver biopsy-metastatic colorectal adenocarcinoma  6. MSI stable; mismatch repair protein normal  7. APC alteration, K  RAS wild-type, no BRAF mutation  8. Cycle 1 FOLFOX 02/02/2016  9. Cycle 2 FOLFOX 02/15/2016  10. Cycle 3 FOLFOX 02/29/2016  11. Cycle 4 FOLFOX 03/14/2016  12. Cycle 5 FOLFOX 03/28/2016  13. Cycle 6 FOLFOX 04/11/2016 (oxaliplatin held secondary to thrombocytopenia)  14. 04/21/2016 restaging CTs-difficulty evaluating liver lesions due to hepatic steatosis. Stable right adrenal nodule. No adenopathy or local recurrence near the rectosigmoid anastomotic site.  15. Cycle 7 FOLFOX 04/25/2016  16. MRI liver 05/02/2016-partial improvement in hepatic metastases  17. Cycle 8 FOLFOX 05/10/2016  18. Cycle 9 FOLFOX 05/23/2016 (oxaliplatin held due to thrombocytopenia)  19. Cycle 10 FOLFOX 06/06/2016 (oxaliplatin dose reduced due to thrombocytopenia)  20. Cycle 11 FOLFOX 06/27/2016 (oxaliplatin held due to neuropathy)  21. Cycle 12 FOLFOX 07/11/2016 (oxaliplatin held) 22. Initiation of maintenance Xeloda 7 days on/7 days off 07/27/2016 23. MRI liver 11/18/2016-decrease in hepatic metastatic disease. No new or progressive disease identified within the abdomen. 24. Continuation of Xeloda 7 days on/7 days off 25. MRI liver 04/27/2017-previous liver lesions not identified, no new lesions, no lymphadenopathy 26. Xeloda continued 7 days on/7 days off 27. MRI liver 12/04/2017 -no evidence of metastatic disease, hepatic steatosis 28. Xeloda continued 7 days on/7 days off 29. MRI liver 07/15/2018-no evidence of metastatic disease. Stable severe hepatic steatosis. 30. Xeloda continued 7 days on/7 days off 31. MRI liver 03/16/2019-hepatic steatosis, no liver mass, focal area of intrahepatic biliary dilatation in segments 2 and 3 of the left lobe-increased 32. Xeloda continued 7 days on/7 days off 33. MRI abdomen 08/19/2019-no findings to suggest liver metastases. Bilateral lung nodules measuring up to 2.8 cm, progressive and more conspicuous than on previous exam 34. CT chest 08/29/2019-multiple pulmonary  metastases,  new from 04/21/2016 35. Cycle 1 FOLFIRI/bevacizumab 09/09/2019 36. Cycle 2 FOLFIRI/bevacizumab 09/26/2019 37. Cycle 3 FOLFIRI/bevacizumab 10/10/2019 38. Cycle 4 FOLFIRI/bevacizumab 10/24/2019 39. Cycle 5 FOLFIRI/bevacizumab 11/07/2019 40. CT chest 11/14/2019-decreased size of lung nodules, no new lesions, hepatic steatosis 41. Cycle 6 FOLFIRI/bevacizumab 11/21/2019 42. Cycle 7 FOLFIRI/bevacizumab 12/05/2019 43. Cycle 8 FOLFIRI/bevacizumab 12/19/2019 44. Cycle 9 FOLFIRI/bevacizumab 01/02/2020 45. Cycle 10 FOLFIRI/bevacizumab 01/16/2020 46. CT chest 01/29/2020-stable bilateral pulmonary metastases. No new or progressive metastatic disease in the chest. 47. Cycle 11 FOLFIRI/bevacizumab 01/30/2020 48. Cycle 12 FOLFIRI/bevacizumab 02/19/2020 49. Cycle 13 FOLFIRI/bevacizumab 03/12/2020 50. Cycle 14 FOLFIRI/bevacizumab 04/02/2020 51. Cycle 15 FOLFIRI/bevacizumab 04/23/2020 52. Cycle 16 FOLFIRI/bevacizumab 05/21/2020  2. Rectal bleeding and constipation secondary to #1  3. History of peripheral vascular disease, status post left lower extremity vascular bypass surgery in April 2017  4. History of nephrolithiasis  5. History of Graves' disease treated with radioactive iodine  6. Anxiety/depression  7. Hypertension  8. Hospitalization 01/19/2016 with wound dehiscence status post secondary suture closure of abdominal wall  9. 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 Hyperglycemia 06/20/2016-diagnosed with diabetes, maintained on insulin  11.Positive COVID test 12/13/2018  12. Hospitalized with seizure activity/DKA. Now on Keppra, insulin adjusted. 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  Recurrent seizures 04/08/2020-Keppra dose increased, CT brain without acute  change   Disposition: Jeff [name] appears stable.  He has completed 15 cycles of FOLFIRI/bevacizumab.  He is tolerating treatment well.  There is no clinical evidence of disease progression.  Plan to proceed with cycle 16 today as scheduled.  Restaging chest CT before next visit.  We reviewed the CBC from today.  Counts adequate to proceed with treatment.  He will return for lab, follow-up, FOLFIRI/bevacizumab in 3 weeks.  He will contact the office in the interim with any problems.    Ned Card ANP/GNP-BC   05/21/2020  10:24 AM

## 2020-05-21 NOTE — Patient Instructions (Signed)

## 2020-05-23 ENCOUNTER — Inpatient Hospital Stay: Payer: Medicaid Other

## 2020-05-23 ENCOUNTER — Other Ambulatory Visit: Payer: Self-pay

## 2020-05-23 VITALS — BP 141/91 | HR 107 | Temp 97.7°F | Resp 18

## 2020-05-23 DIAGNOSIS — Z5112 Encounter for antineoplastic immunotherapy: Secondary | ICD-10-CM | POA: Diagnosis not present

## 2020-05-23 DIAGNOSIS — C187 Malignant neoplasm of sigmoid colon: Secondary | ICD-10-CM

## 2020-05-23 MED ORDER — PEGFILGRASTIM-CBQV 6 MG/0.6ML ~~LOC~~ SOSY
6.0000 mg | PREFILLED_SYRINGE | Freq: Once | SUBCUTANEOUS | Status: AC
  Administered 2020-05-23: 6 mg via SUBCUTANEOUS

## 2020-05-23 MED ORDER — HEPARIN SOD (PORK) LOCK FLUSH 100 UNIT/ML IV SOLN
500.0000 [IU] | Freq: Once | INTRAVENOUS | Status: AC | PRN
Start: 2020-05-23 — End: 2020-05-23
  Administered 2020-05-23: 500 [IU]
  Filled 2020-05-23: qty 5

## 2020-05-23 MED ORDER — SODIUM CHLORIDE 0.9% FLUSH
10.0000 mL | INTRAVENOUS | Status: DC | PRN
  Administered 2020-05-23: 10 mL
  Filled 2020-05-23: qty 10

## 2020-05-23 NOTE — Patient Instructions (Signed)

## 2020-05-24 ENCOUNTER — Other Ambulatory Visit: Payer: Self-pay | Admitting: Cardiovascular Disease

## 2020-05-25 NOTE — Telephone Encounter (Signed)
Rx request sent to pharmacy.  

## 2020-05-29 ENCOUNTER — Ambulatory Visit (HOSPITAL_COMMUNITY)
Admission: RE | Admit: 2020-05-29 | Discharge: 2020-05-29 | Disposition: A | Discharge status: Home / Self Care | Payer: Medicaid Other | Source: Ambulatory Visit | Attending: Cardiovascular Disease | Admitting: Cardiovascular Disease

## 2020-05-29 ENCOUNTER — Other Ambulatory Visit: Payer: Self-pay

## 2020-05-29 DIAGNOSIS — I739 Peripheral vascular disease, unspecified: Secondary | ICD-10-CM | POA: Insufficient documentation

## 2020-06-03 ENCOUNTER — Encounter: Payer: Self-pay | Admitting: *Deleted

## 2020-06-07 ENCOUNTER — Other Ambulatory Visit: Payer: Self-pay | Admitting: Oncology

## 2020-06-09 ENCOUNTER — Other Ambulatory Visit: Payer: Self-pay

## 2020-06-09 ENCOUNTER — Ambulatory Visit (HOSPITAL_COMMUNITY)
Admission: RE | Admit: 2020-06-09 | Discharge: 2020-06-09 | Disposition: A | Discharge status: Home / Self Care | Payer: Medicaid Other | Source: Ambulatory Visit | Attending: Nurse Practitioner | Admitting: Nurse Practitioner

## 2020-06-09 DIAGNOSIS — M7502 Adhesive capsulitis of left shoulder: Secondary | ICD-10-CM | POA: Insufficient documentation

## 2020-06-09 DIAGNOSIS — C787 Secondary malignant neoplasm of liver and intrahepatic bile duct: Secondary | ICD-10-CM | POA: Insufficient documentation

## 2020-06-09 DIAGNOSIS — C189 Malignant neoplasm of colon, unspecified: Secondary | ICD-10-CM | POA: Diagnosis not present

## 2020-06-10 MED FILL — Fosaprepitant Dimeglumine For IV Infusion 150 MG (Base Eq): INTRAVENOUS | Qty: 5 | Status: AC

## 2020-06-11 ENCOUNTER — Ambulatory Visit: Payer: Medicaid Other

## 2020-06-11 ENCOUNTER — Inpatient Hospital Stay (HOSPITAL_BASED_OUTPATIENT_CLINIC_OR_DEPARTMENT_OTHER): Payer: Medicaid Other | Admitting: Nurse Practitioner

## 2020-06-11 ENCOUNTER — Other Ambulatory Visit: Payer: Medicaid Other

## 2020-06-11 ENCOUNTER — Encounter: Payer: Self-pay | Admitting: Nurse Practitioner

## 2020-06-11 ENCOUNTER — Inpatient Hospital Stay: Payer: Medicaid Other

## 2020-06-11 ENCOUNTER — Other Ambulatory Visit: Payer: Self-pay

## 2020-06-11 ENCOUNTER — Ambulatory Visit: Payer: Medicaid Other | Admitting: Oncology

## 2020-06-11 VITALS — BP 148/82 | HR 94 | Temp 97.9°F | Resp 16 | Ht 70.0 in | Wt 237.1 lb

## 2020-06-11 DIAGNOSIS — Z5112 Encounter for antineoplastic immunotherapy: Secondary | ICD-10-CM | POA: Diagnosis not present

## 2020-06-11 DIAGNOSIS — Z95828 Presence of other vascular implants and grafts: Secondary | ICD-10-CM

## 2020-06-11 DIAGNOSIS — C787 Secondary malignant neoplasm of liver and intrahepatic bile duct: Secondary | ICD-10-CM | POA: Diagnosis not present

## 2020-06-11 DIAGNOSIS — C189 Malignant neoplasm of colon, unspecified: Secondary | ICD-10-CM | POA: Diagnosis not present

## 2020-06-11 LAB — CMP (CANCER CENTER ONLY)
ALT: 61 U/L — ABNORMAL HIGH (ref 0–44)
AST: 38 U/L (ref 15–41)
Albumin: 3.9 g/dL (ref 3.5–5.0)
Alkaline Phosphatase: 153 U/L — ABNORMAL HIGH (ref 38–126)
Anion gap: 13 (ref 5–15)
BUN: 11 mg/dL (ref 6–20)
CO2: 22 mmol/L (ref 22–32)
Calcium: 9.1 mg/dL (ref 8.9–10.3)
Chloride: 104 mmol/L (ref 98–111)
Creatinine: 1.13 mg/dL (ref 0.61–1.24)
GFR, Estimated: >60 mL/min (ref >60)
Glucose, Bld: 299 mg/dL — ABNORMAL HIGH (ref 70–99)
Potassium: 3.7 mmol/L (ref 3.5–5.1)
Sodium: 139 mmol/L (ref 135–145)
Total Bilirubin: 0.4 mg/dL (ref 0.3–1.2)
Total Protein: 7.9 g/dL (ref 6.5–8.1)

## 2020-06-11 LAB — CEA (IN HOUSE-CHCC): CEA (CHCC-In House): 8.99 ng/mL — ABNORMAL HIGH (ref 0.00–5.00)

## 2020-06-11 LAB — CBC WITH DIFFERENTIAL (CANCER CENTER ONLY)
Abs Immature Granulocytes: 0.14 10*3/uL — ABNORMAL HIGH (ref 0.00–0.07)
Basophils Absolute: 0 10*3/uL (ref 0.0–0.1)
Basophils Relative: 0 %
Eosinophils Absolute: 0.1 10*3/uL (ref 0.0–0.5)
Eosinophils Relative: 1 %
HCT: 45.1 % (ref 39.0–52.0)
Hemoglobin: 14 g/dL (ref 13.0–17.0)
Immature Granulocytes: 1 %
Lymphocytes Relative: 19 %
Lymphs Abs: 3 10*3/uL (ref 0.7–4.0)
MCH: 27.1 pg (ref 26.0–34.0)
MCHC: 31 g/dL (ref 30.0–36.0)
MCV: 87.4 fL (ref 80.0–100.0)
Monocytes Absolute: 1 10*3/uL (ref 0.1–1.0)
Monocytes Relative: 6 %
Neutro Abs: 11.8 10*3/uL — ABNORMAL HIGH (ref 1.7–7.7)
Neutrophils Relative %: 73 %
Platelet Count: 154 10*3/uL (ref 150–400)
RBC: 5.16 MIL/uL (ref 4.22–5.81)
RDW: 16.2 % — ABNORMAL HIGH (ref 11.5–15.5)
WBC Count: 16.2 10*3/uL — ABNORMAL HIGH (ref 4.0–10.5)
nRBC: 0 % (ref 0.0–0.2)

## 2020-06-11 LAB — TOTAL PROTEIN, URINE DIPSTICK: Protein, ur: NEGATIVE mg/dL

## 2020-06-11 MED ORDER — SODIUM CHLORIDE 0.9% FLUSH
10.0000 mL | INTRAVENOUS | Status: DC | PRN
  Filled 2020-06-11: qty 10

## 2020-06-11 MED ORDER — HEPARIN SOD (PORK) LOCK FLUSH 100 UNIT/ML IV SOLN
500.0000 [IU] | Freq: Once | INTRAVENOUS | Status: AC
  Administered 2020-06-11: 500 [IU] via INTRAVENOUS
  Filled 2020-06-11: qty 5

## 2020-06-11 MED ORDER — DOXYCYCLINE HYCLATE 100 MG PO TABS: 100.0000 mg | ORAL_TABLET | Freq: Two times a day (BID) | ORAL | 3 refills | Status: DC

## 2020-06-11 MED ORDER — SODIUM CHLORIDE 0.9% FLUSH
10.0000 mL | INTRAVENOUS | Status: DC | PRN
  Administered 2020-06-11: 10 mL via INTRAVENOUS
  Filled 2020-06-11: qty 10

## 2020-06-11 NOTE — Progress Notes (Signed)
[facility] OFFICE PROGRESS NOTE   Diagnosis:  Colon cancer  INTERVAL HISTORY:   Mr. Forbush returns as scheduled.  He completed another cycle of FOLFIRI/bevacizumab 05/21/2020.  He overall feels well.  No nausea or vomiting.  No mouth sores.  No diarrhea.  No bleeding.  He denies fever, cough, shortness of breath.  Blood sugars have been fairly well controlled.  His wife reports two shoulder steroid injections earlier this week, thinks his blood sugars may be higher today related to the injections.  No seizure activity.  Objective:  Vital signs in last 24 hours:  Blood pressure (!) 148/82, pulse 94, temperature 97.9 F (36.6 C), temperature source Tympanic, resp. rate 16, height $RemoveBe'5\' 10"'edsHkKJne$  (1.778 m), weight 237 lb 1.6 oz (107.5 kg), SpO2 100 %.    HEENT: No thrush or ulcers. Resp: Lungs clear bilaterally. Cardio: Regular rate and rhythm. GI: Abdomen soft and nontender.  No hepatomegaly. Vascular: No leg edema.  Calves soft and nontender. Port-A-Cath without erythema.  Lab Results:  Lab Results  Component Value Date   WBC 16.2 (H) 06/11/2020   HGB 14.0 06/11/2020   HCT 45.1 06/11/2020   MCV 87.4 06/11/2020   PLT 154 06/11/2020   NEUTROABS 11.8 (H) 06/11/2020    Imaging:  CT Chest Wo Contrast  Result Date: 06/09/2020 CLINICAL DATA:  Restaging of metastatic colon cancer. EXAM: CT CHEST WITHOUT CONTRAST TECHNIQUE: Multidetector CT imaging of the chest was performed following the standard protocol without IV contrast. COMPARISON:  01/29/2020 FINDINGS: Cardiovascular: Right Port-A-Cath tip at low SVC. Aortic atherosclerosis. Tortuous thoracic aorta. Normal heart size, without pericardial effusion. Mediastinum/Nodes: No supraclavicular adenopathy. No mediastinal or definite hilar adenopathy, given limitations of unenhanced CT. Lungs/Pleura: No pleural fluid. Bilateral pulmonary nodules/metastasis. -right middle lobe 2.9 cm nodule on 78/7 is similar. -a medial right lower  lobe 1.1 cm nodule on 96/7 measures 9 mm on the prior. -more posterior right lower lobe 1.7 cm nodule on 96/7 measures 1.6 cm on the prior exam. -a lingular mass measures 3.6 cm on 72/7 versus 3.2 cm on the prior exam. -superior segment left lower lobe 2.3 cm nodule on 61/7 measured 2.0 cm on the prior exam. Upper Abdomen: Marked hepatic steatosis. Possible subtle soft tissue fullness at 1.6 cm along the right hepatic capsule on 158/2, new. Normal imaged portions of the spleen, stomach, pancreas, gallbladder, adrenal glands, right kidney. Upper pole left renal collecting system stones of up to the 5 mm. Musculoskeletal: No acute osseous abnormality. IMPRESSION: 1. Mild progression of pulmonary metastasis. Some lesions have increased in size while others are similar. 2. Hepatic steatosis. Possible developing soft tissue fullness along the right hepatic capsule. This could either be re-evaluated on follow-up chest CTs or more entirely characterized with dedicated pre and post contrast abdominal MRI. 3. No thoracic adenopathy. 4.  Aortic Atherosclerosis (ICD10-I70.0). 5. Left nephrolithiasis. Electronically Signed   By: Abigail Miyamoto M.D.   On: 06/09/2020 11:59    Medications: I have reviewed the patient's current medications.  Assessment/Plan: 1. 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 CTschest, abdomen, and pelvis on 12/11/2015-indeterminate tiny pulmonary nodules, multiple liver metastases, small nodes in the sigmoid mesocolon  3. Laparoscopic sigmoid colectomy, liver biopsy, Port-A-Cath placement 01/14/2016  4. Pathology sigmoid colon resection-colonic adenocarcinoma, 5.3 cm extending into pericolonic connective tissue, positive lymph-vascular invasion, positive perineural invasion, negative margins, metastatic carcinoma in9 of 28 lymph nodes  5. Pathology liver biopsy-metastatic colorectal  adenocarcinoma   6. MSI stable; mismatch repair protein normal  7. 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 Cycle 1 FOLFOX 02/02/2016  9. Cycle 2 FOLFOX 02/15/2016  10. Cycle 3 FOLFOX 02/29/2016  11. Cycle 4 FOLFOX 03/14/2016  12. Cycle 5 FOLFOX 03/28/2016  13. Cycle 6 FOLFOX 04/11/2016 (oxaliplatin held secondary to thrombocytopenia)  14. 04/21/2016 restaging CTs-difficulty evaluating liver lesions due to hepatic steatosis. Stable right adrenal nodule. No adenopathy or local recurrence near the rectosigmoid anastomotic site.  15. Cycle 7 FOLFOX 04/25/2016  16. MRI liver 05/02/2016-partial improvement in hepatic metastases  17. Cycle 8 FOLFOX 05/10/2016  18. Cycle 9 FOLFOX 05/23/2016 (oxaliplatin held due to thrombocytopenia)  19. Cycle 10 FOLFOX 06/06/2016 (oxaliplatin dose reduced due to thrombocytopenia)  20. Cycle 11 FOLFOX 06/27/2016 (oxaliplatin held due to neuropathy)  21. Cycle 12 FOLFOX 07/11/2016 (oxaliplatin held) 22. Initiation of maintenance Xeloda 7 days on/7 days off 07/27/2016 23. MRI liver 11/18/2016-decrease in hepatic metastatic disease. No new or progressive disease identified within the abdomen. 24. Continuation of Xeloda 7 days on/7 days off 25. MRI liver 04/27/2017-previous liver lesions not identified, no new lesions, no lymphadenopathy 26. Xeloda continued 7 days on/7 days off 27. MRI liver 12/04/2017 -no evidence of metastatic disease, hepatic steatosis 28. Xeloda continued 7 days on/7 days off 29. MRI liver 07/15/2018-no evidence of metastatic disease. Stable severe hepatic steatosis. 30. Xeloda continued 7 days on/7 days off 31. MRI liver 03/16/2019-hepatic steatosis, no liver mass, focal area of intrahepatic biliary dilatation in segments 2 and 3 of the left lobe-increased 32. Xeloda continued 7 days on/7 days off 33. MRI abdomen 08/19/2019-no findings to suggest liver metastases. Bilateral lung nodules measuring up to 2.8 cm, progressive and more conspicuous than  on previous exam 34. CT chest 08/29/2019-multiple pulmonary metastases, new from 04/21/2016 35. Cycle 1 FOLFIRI/bevacizumab 09/09/2019 36. Cycle 2 FOLFIRI/bevacizumab 09/26/2019 37. Cycle 3 FOLFIRI/bevacizumab 10/10/2019 38. Cycle 4 FOLFIRI/bevacizumab 10/24/2019 39. Cycle 5 FOLFIRI/bevacizumab 11/07/2019 40. CT chest 11/14/2019-decreased size of lung nodules, no new lesions, hepatic steatosis 41. Cycle 6 FOLFIRI/bevacizumab 11/21/2019 42. Cycle 7 FOLFIRI/bevacizumab 12/05/2019 43. Cycle 8 FOLFIRI/bevacizumab 12/19/2019 44. Cycle 9 FOLFIRI/bevacizumab 01/02/2020 45. Cycle 10 FOLFIRI/bevacizumab 01/16/2020 46. CT chest 01/29/2020-stable bilateral pulmonary metastases. No new or progressive metastatic disease in the chest. 47. Cycle 11 FOLFIRI/bevacizumab 01/30/2020 48. Cycle 12 FOLFIRI/bevacizumab 02/19/2020 49. Cycle 13 FOLFIRI/bevacizumab 03/12/2020 50. Cycle 14 FOLFIRI/bevacizumab 04/02/2020 51. Cycle 15 FOLFIRI/bevacizumab 04/23/2020 52. Cycle 16 FOLFIRI/bevacizumab 05/21/2020 53. CT chest 06/09/2020-mild progression pulmonary metastasis.  Some lesions have increased in size while others are similar.  2. Rectal bleeding and constipation secondary to #1  3. History of peripheral vascular disease, status post left lower extremity vascular bypass surgery in April 2017  4. History of nephrolithiasis  5. History of Graves' disease treated with radioactive iodine  6. Anxiety/depression  7. Hypertension  8. Hospitalization 01/19/2016 with wound dehiscence status post secondary suture closure of abdominal wall  9. 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 Hyperglycemia 06/20/2016-diagnosed with diabetes, maintained on insulin  11.Positive COVID test 12/13/2018  12. Hospitalized with seizure activity/DKA. Now on Keppra, insulin adjusted. Brain MRI 10/25/2019 with no seizure etiology identified, no acute abnormality; EEG  10/25/2019 with evidence of epileptogenicity arising from right frontocentral region.  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 Raczka appears unchanged.  He has completed 16 cycles of FOLFIRI/bevacizumab.  Recent restaging chest CT shows mild progression of the  lung metastases.  I reviewed the CT result with Dr. Benay Spice prior to today's appointment.  Dr. Gearldine Shown recommendation is to discontinue FOLFIRI/bevacizumab and begin treatment with irinotecan/Panitumumab on a 2-week schedule.  I discussed the above with Mr. Kersten and his wife at today's visit.  They agree to proceed as planned above.  We again reviewed potential toxicities associated with irinotecan including bone marrow toxicity, nausea, hair loss, diarrhea.  We reviewed potential toxicities associated with Panitumumab including an allergic reaction, rash, diarrhea, pneumonitis, ocular toxicity, hypomagnesemia, paronychia, eyelash growth.  He understands the rationale for doxycycline and will begin the day prior to cycle 1.  He will return for lab, follow-up, irinotecan/Panitumumab in 1 week.  He will contact the office in the interim with any problems.  Above reviewed with Dr. Benay Spice prior to today's appointment.    Ned Card ANP/GNP-BC   06/11/2020  9:33 AM

## 2020-06-12 ENCOUNTER — Telehealth: Payer: Self-pay | Admitting: Nurse Practitioner

## 2020-06-12 ENCOUNTER — Other Ambulatory Visit: Payer: Self-pay | Admitting: Oncology

## 2020-06-12 ENCOUNTER — Other Ambulatory Visit: Payer: Self-pay | Admitting: Nurse Practitioner

## 2020-06-12 NOTE — Progress Notes (Signed)
DISCONTINUE ON PATHWAY REGIMEN - Colorectal     A cycle is every 14 days:     Bevacizumab-xxxx      Irinotecan      Leucovorin      Fluorouracil      Fluorouracil   **Always confirm dose/schedule in your pharmacy ordering system**  REASON: Disease Progression PRIOR TREATMENT: [id number]: FOLFIRI + Bevacizumab q14 Days TREATMENT RESPONSE: Partial Response (PR)  START ON PATHWAY REGIMEN - Colorectal     A cycle is every 14 days:     Panitumumab      Irinotecan   **Always confirm dose/schedule in your pharmacy ordering system**  Patient Characteristics: Distant Metastases, Nonsurgical Candidate, KRAS/NRAS Wild-Type (BRAF V600 Wild-Type/Unknown), Standard Cytotoxic Therapy, Third Line Standard Cytotoxic Therapy, No Prior Anti-EGFR Therapy Tumor Location: Colon Therapeutic Status: Distant Metastases Microsatellite/Mismatch Repair Status: MSS/pMMR BRAF Mutation Status: Wild-Type (no mutation) KRAS/NRAS Mutation Status: Wild-Type (no mutation) Preferred Therapy Approach: Standard Cytotoxic Therapy Standard Cytotoxic Line of Therapy: Third Building services engineer Cytotoxic Therapy Intent of Therapy: Non-Curative / Palliative Intent, Discussed with Patient

## 2020-06-12 NOTE — Telephone Encounter (Signed)
Scheduled appointments per 1/27 los. Called patient no answer. Left message with appointments date and times.

## 2020-06-18 ENCOUNTER — Inpatient Hospital Stay (HOSPITAL_BASED_OUTPATIENT_CLINIC_OR_DEPARTMENT_OTHER): Payer: Medicaid Other | Admitting: Nurse Practitioner

## 2020-06-18 ENCOUNTER — Inpatient Hospital Stay: Payer: Medicaid Other

## 2020-06-18 ENCOUNTER — Other Ambulatory Visit: Payer: Self-pay

## 2020-06-18 ENCOUNTER — Inpatient Hospital Stay: Payer: Medicaid Other | Attending: Oncology

## 2020-06-18 ENCOUNTER — Telehealth: Payer: Self-pay | Admitting: Nurse Practitioner

## 2020-06-18 VITALS — BP 138/78 | HR 88 | Temp 98.2°F | Resp 18

## 2020-06-18 VITALS — BP 140/80 | HR 115 | Temp 97.7°F | Resp 18 | Ht 70.0 in | Wt 237.0 lb

## 2020-06-18 DIAGNOSIS — Z95828 Presence of other vascular implants and grafts: Secondary | ICD-10-CM

## 2020-06-18 DIAGNOSIS — C187 Malignant neoplasm of sigmoid colon: Secondary | ICD-10-CM | POA: Insufficient documentation

## 2020-06-18 DIAGNOSIS — C7801 Secondary malignant neoplasm of right lung: Secondary | ICD-10-CM | POA: Diagnosis not present

## 2020-06-18 DIAGNOSIS — C189 Malignant neoplasm of colon, unspecified: Secondary | ICD-10-CM | POA: Diagnosis not present

## 2020-06-18 DIAGNOSIS — C787 Secondary malignant neoplasm of liver and intrahepatic bile duct: Secondary | ICD-10-CM

## 2020-06-18 DIAGNOSIS — Z5111 Encounter for antineoplastic chemotherapy: Secondary | ICD-10-CM | POA: Insufficient documentation

## 2020-06-18 DIAGNOSIS — Z5112 Encounter for antineoplastic immunotherapy: Secondary | ICD-10-CM | POA: Diagnosis not present

## 2020-06-18 DIAGNOSIS — C7802 Secondary malignant neoplasm of left lung: Secondary | ICD-10-CM | POA: Diagnosis not present

## 2020-06-18 LAB — CMP (CANCER CENTER ONLY)
ALT: 44 U/L (ref 0–44)
AST: 30 U/L (ref 15–41)
Albumin: 3.8 g/dL (ref 3.5–5.0)
Alkaline Phosphatase: 139 U/L — ABNORMAL HIGH (ref 38–126)
Anion gap: 11 (ref 5–15)
BUN: 11 mg/dL (ref 6–20)
CO2: 23 mmol/L (ref 22–32)
Calcium: 9.1 mg/dL (ref 8.9–10.3)
Chloride: 104 mmol/L (ref 98–111)
Creatinine: 1.16 mg/dL (ref 0.61–1.24)
GFR, Estimated: >60 mL/min (ref >60)
Glucose, Bld: 310 mg/dL — ABNORMAL HIGH (ref 70–99)
Potassium: 3.8 mmol/L (ref 3.5–5.1)
Sodium: 138 mmol/L (ref 135–145)
Total Bilirubin: 0.4 mg/dL (ref 0.3–1.2)
Total Protein: 8 g/dL (ref 6.5–8.1)

## 2020-06-18 LAB — CBC WITH DIFFERENTIAL (CANCER CENTER ONLY)
Abs Immature Granulocytes: 0.1 10*3/uL — ABNORMAL HIGH (ref 0.00–0.07)
Basophils Absolute: 0 10*3/uL (ref 0.0–0.1)
Basophils Relative: 0 %
Eosinophils Absolute: 0.2 10*3/uL (ref 0.0–0.5)
Eosinophils Relative: 1 %
HCT: 45.9 % (ref 39.0–52.0)
Hemoglobin: 14.2 g/dL (ref 13.0–17.0)
Immature Granulocytes: 1 %
Lymphocytes Relative: 23 %
Lymphs Abs: 3.4 10*3/uL (ref 0.7–4.0)
MCH: 27.2 pg (ref 26.0–34.0)
MCHC: 30.9 g/dL (ref 30.0–36.0)
MCV: 87.9 fL (ref 80.0–100.0)
Monocytes Absolute: 1.2 10*3/uL — ABNORMAL HIGH (ref 0.1–1.0)
Monocytes Relative: 8 %
Neutro Abs: 10.1 10*3/uL — ABNORMAL HIGH (ref 1.7–7.7)
Neutrophils Relative %: 67 %
Platelet Count: 195 10*3/uL (ref 150–400)
RBC: 5.22 MIL/uL (ref 4.22–5.81)
RDW: 16.1 % — ABNORMAL HIGH (ref 11.5–15.5)
WBC Count: 15.1 10*3/uL — ABNORMAL HIGH (ref 4.0–10.5)
nRBC: 0 % (ref 0.0–0.2)

## 2020-06-18 LAB — MAGNESIUM: Magnesium: 1.6 mg/dL — ABNORMAL LOW (ref 1.7–2.4)

## 2020-06-18 MED ORDER — IRINOTECAN HCL CHEMO INJECTION 100 MG/5ML
180.0000 mg/m2 | Freq: Once | INTRAVENOUS | Status: AC
  Administered 2020-06-18: 420 mg via INTRAVENOUS
  Filled 2020-06-18: qty 15

## 2020-06-18 MED ORDER — SODIUM CHLORIDE 0.9 % IV SOLN: 5.0000 mg | Freq: Once | INTRAVENOUS | Status: DC

## 2020-06-18 MED ORDER — PALONOSETRON HCL INJECTION 0.25 MG/5ML
INTRAVENOUS | Status: AC
  Filled 2020-06-18: qty 5

## 2020-06-18 MED ORDER — SODIUM CHLORIDE 0.9 % IV SOLN
6.0000 mg/kg | Freq: Once | INTRAVENOUS | Status: AC
  Administered 2020-06-18: 600 mg via INTRAVENOUS
  Filled 2020-06-18: qty 20

## 2020-06-18 MED ORDER — ATROPINE SULFATE 1 MG/ML IJ SOLN
0.4000 mg | Freq: Once | INTRAMUSCULAR | Status: AC
  Administered 2020-06-18: 0.4 mg via INTRAVENOUS

## 2020-06-18 MED ORDER — SODIUM CHLORIDE 0.9% FLUSH
10.0000 mL | INTRAVENOUS | Status: DC | PRN
  Administered 2020-06-18: 10 mL via INTRAVENOUS
  Filled 2020-06-18: qty 10

## 2020-06-18 MED ORDER — DEXAMETHASONE SODIUM PHOSPHATE 10 MG/ML IJ SOLN
5.0000 mg | Freq: Once | INTRAMUSCULAR | Status: AC
  Administered 2020-06-18: 5 mg via INTRAVENOUS

## 2020-06-18 MED ORDER — DEXAMETHASONE SODIUM PHOSPHATE 10 MG/ML IJ SOLN
INTRAMUSCULAR | Status: AC
  Filled 2020-06-18: qty 1

## 2020-06-18 MED ORDER — SODIUM CHLORIDE 0.9 % IV SOLN
Freq: Once | INTRAVENOUS | Status: AC
  Filled 2020-06-18: qty 250

## 2020-06-18 MED ORDER — PALONOSETRON HCL INJECTION 0.25 MG/5ML
0.2500 mg | Freq: Once | INTRAVENOUS | Status: AC
  Administered 2020-06-18: 0.25 mg via INTRAVENOUS

## 2020-06-18 MED ORDER — ATROPINE SULFATE 0.4 MG/ML IJ SOLN
INTRAMUSCULAR | Status: AC
  Filled 2020-06-18: qty 1

## 2020-06-18 NOTE — Progress Notes (Signed)
Patient magnesium is 1.6 today, ok to treat per Dr. Benay Spice

## 2020-06-18 NOTE — Patient Instructions (Signed)
[facility] Discharge Instructions for Patients Receiving Chemotherapy  Today you received the following chemotherapy agents:Panitumumab /irinotecanl  To help prevent nausea and vomiting after your treatment, we encourage you to take your nausea medication as directed.  If you develop nausea and vomiting that is not controlled by your nausea medication, call the clinic.   BELOW ARE SYMPTOMS THAT SHOULD BE REPORTED IMMEDIATELY:  *FEVER GREATER THAN 100.5 F  *CHILLS WITH OR WITHOUT FEVER  NAUSEA AND VOMITING THAT IS NOT CONTROLLED WITH YOUR NAUSEA MEDICATION  *UNUSUAL SHORTNESS OF BREATH  *UNUSUAL BRUISING OR BLEEDING  TENDERNESS IN MOUTH AND THROAT WITH OR WITHOUT PRESENCE OF ULCERS  *URINARY PROBLEMS  *BOWEL PROBLEMS  UNUSUAL RASH Items with * indicate a potential emergency and should be followed up as soon as possible.  Feel free to call the clinic should you have any questions or concerns. The clinic phone number is [phone].  Please show the [facility] at check-in to the Emergency Department and triage nurse.

## 2020-06-18 NOTE — Progress Notes (Signed)
Pt discharged in no apparent distress. Pt left ambulatory without assistance. Pt aware of discharge instructions and verbalized understanding and had no further questions.  

## 2020-06-18 NOTE — Progress Notes (Addendum)
[facility] OFFICE PROGRESS NOTE   Diagnosis: Colon cancer  INTERVAL HISTORY:   [name] returns as scheduled.  No complaints.  Good appetite.  Denies nausea.  Bowels are moving, some constipation.  Objective:  Vital signs in last 24 hours:  Blood pressure 140/80, pulse (!) 115, temperature 97.7 F (36.5 C), temperature source Tympanic, resp. rate 18, height $RemoveBe'5\' 10"'nCYzgZckT$  (1.778 m), weight 237 lb (107.5 kg), SpO2 99 %.    HEENT: No thrush or ulcers. Resp: Lungs clear bilaterally. Cardio: Regular rate and rhythm. GI: No hepatomegaly. Vascular: No leg edema. Skin: No rash. Port-A-Cath without erythema.   Lab Results:  Lab Results  Component Value Date   WBC 16.2 (H) 06/11/2020   HGB 14.0 06/11/2020   HCT 45.1 06/11/2020   MCV 87.4 06/11/2020   PLT 154 06/11/2020   NEUTROABS 11.8 (H) 06/11/2020    Imaging:  No results found.  Medications: I have reviewed the patient's current medications.  Assessment/Plan: 1. 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 CTschest, abdomen, and pelvis on 12/11/2015-indeterminate tiny pulmonary nodules, multiple liver metastases, small nodes in the sigmoid mesocolon  3. Laparoscopic sigmoid colectomy, liver biopsy, Port-A-Cath placement 01/14/2016  4. Pathology sigmoid colon resection-colonic adenocarcinoma, 5.3 cm extending into pericolonic connective tissue, positive lymph-vascular invasion, positive perineural invasion, negative margins, metastatic carcinoma in9 of 28 lymph nodes  5. Pathology liver biopsy-metastatic colorectal adenocarcinoma  6. MSI stable; mismatch repair protein normal  7. 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 Cycle 1 FOLFOX 02/02/2016  9. Cycle 2 FOLFOX 02/15/2016  10. Cycle 3 FOLFOX 02/29/2016  11. Cycle 4 FOLFOX 03/14/2016  12. Cycle 5 FOLFOX 03/28/2016  13. Cycle 6 FOLFOX 04/11/2016  (oxaliplatin held secondary to thrombocytopenia)  14. 04/21/2016 restaging CTs-difficulty evaluating liver lesions due to hepatic steatosis. Stable right adrenal nodule. No adenopathy or local recurrence near the rectosigmoid anastomotic site.  15. Cycle 7 FOLFOX 04/25/2016  16. MRI liver 05/02/2016-partial improvement in hepatic metastases  17. Cycle 8 FOLFOX 05/10/2016  18. Cycle 9 FOLFOX 05/23/2016 (oxaliplatin held due to thrombocytopenia)  19. Cycle 10 FOLFOX 06/06/2016 (oxaliplatin dose reduced due to thrombocytopenia)  20. Cycle 11 FOLFOX 06/27/2016 (oxaliplatin held due to neuropathy)  21. Cycle 12 FOLFOX 07/11/2016 (oxaliplatin held) 22. Initiation of maintenance Xeloda 7 days on/7 days off 07/27/2016 23. MRI liver 11/18/2016-decrease in hepatic metastatic disease. No new or progressive disease identified within the abdomen. 24. Continuation of Xeloda 7 days on/7 days off 25. MRI liver 04/27/2017-previous liver lesions not identified, no new lesions, no lymphadenopathy 26. Xeloda continued 7 days on/7 days off 27. MRI liver 12/04/2017 -no evidence of metastatic disease, hepatic steatosis 28. Xeloda continued 7 days on/7 days off 29. MRI liver 07/15/2018-no evidence of metastatic disease. Stable severe hepatic steatosis. 30. Xeloda continued 7 days on/7 days off 31. MRI liver 03/16/2019-hepatic steatosis, no liver mass, focal area of intrahepatic biliary dilatation in segments 2 and 3 of the left lobe-increased 32. Xeloda continued 7 days on/7 days off 33. MRI abdomen 08/19/2019-no findings to suggest liver metastases. Bilateral lung nodules measuring up to 2.8 cm, progressive and more conspicuous than on previous exam 34. CT chest 08/29/2019-multiple pulmonary metastases, new from 04/21/2016 35. Cycle 1 FOLFIRI/bevacizumab 09/09/2019 36. Cycle 2 FOLFIRI/bevacizumab 09/26/2019 37. Cycle 3 FOLFIRI/bevacizumab 10/10/2019 38. Cycle 4 FOLFIRI/bevacizumab 10/24/2019 39. Cycle 5  FOLFIRI/bevacizumab 11/07/2019 40. CT chest 11/14/2019-decreased size of lung nodules, no new lesions, hepatic  steatosis 41. Cycle 6 FOLFIRI/bevacizumab 11/21/2019 42. Cycle 7 FOLFIRI/bevacizumab 12/05/2019 43. Cycle 8 FOLFIRI/bevacizumab 12/19/2019 44. Cycle 9 FOLFIRI/bevacizumab 01/02/2020 45. Cycle 10 FOLFIRI/bevacizumab 01/16/2020 46. CT chest 01/29/2020-stable bilateral pulmonary metastases. No new or progressive metastatic disease in the chest. 47. Cycle 11 FOLFIRI/bevacizumab 01/30/2020 48. Cycle 12 FOLFIRI/bevacizumab 02/19/2020 49. Cycle 13 FOLFIRI/bevacizumab 03/12/2020 50. Cycle 14 FOLFIRI/bevacizumab 04/02/2020 51. Cycle 15 FOLFIRI/bevacizumab 04/23/2020 52. Cycle 16 FOLFIRI/bevacizumab 05/21/2020 53. CT chest 06/09/2020-mild progression pulmonary metastasis.  Some lesions have increased in size while others are similar. 54. Cycle 1 irinotecan/Panitumumab 06/18/2020  2. Rectal bleeding and constipation secondary to #1  3. History of peripheral vascular disease, status post left lower extremity vascular bypass surgery in April 2017  4. History of nephrolithiasis  5. History of Graves' disease treated with radioactive iodine  6. Anxiety/depression  7. Hypertension  8. Hospitalization 01/19/2016 with wound dehiscence status post secondary suture closure of abdominal wall  9. 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 Hyperglycemia 06/20/2016-diagnosed with diabetes, maintained on insulin  11.Positive COVID test 12/13/2018  12. Hospitalized with seizure activity/DKA. Now on Keppra, insulin adjusted. 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 appears unchanged.  Recent  restaging CTs show evidence of progression.  Dr. Benay Spice recommendation is to change treatment to irinotecan/Panitumumab every 2 weeks.  Potential toxicities again reviewed.  [name] agrees to proceed.  He began doxycycline yesterday.  We reviewed the CBC from today.  Counts adequate to proceed as above.  He will return for lab, follow-up, cycle 2 irinotecan/Panitumumab in 2 weeks.  He will contact the office in the interim with any problems.  Patient seen with Dr. Benay Spice.    Ned Card ANP/GNP-BC   06/18/2020  11:47 AM  This was a shared visit with Ned Card.  The restaging CT last week reveals mild progression of pulmonary metastases.  The CEA is slightly higher.  We discussed the CT findings and reviewed treatment options with Mr. Gillen and his wife.  I reviewed the CT images.  We recommend changing the systemic therapy regimen to irinotecan/panitumumab.  I reviewed potential toxicities associated with the irinotecan/Panitumumab regimen.  He agrees to proceed.  A chemotherapy plan was entered.  I was present for greater than 50% of today's visit and performed medical decision making.  Julieanne Manson, MD

## 2020-06-18 NOTE — Telephone Encounter (Signed)
Scheduled follow-up appointment per 2/3 los. Patient is aware. °

## 2020-06-19 ENCOUNTER — Telehealth: Payer: Self-pay | Admitting: *Deleted

## 2020-06-19 ENCOUNTER — Telehealth: Payer: Self-pay

## 2020-06-19 ENCOUNTER — Other Ambulatory Visit: Payer: Self-pay | Admitting: Nurse Practitioner

## 2020-06-19 DIAGNOSIS — C787 Secondary malignant neoplasm of liver and intrahepatic bile duct: Secondary | ICD-10-CM

## 2020-06-19 MED ORDER — MAGNESIUM OXIDE 400 (241.3 MG) MG PO TABS: 400.0000 mg | ORAL_TABLET | Freq: Two times a day (BID) | ORAL | 3 refills | Status: DC

## 2020-06-19 NOTE — Telephone Encounter (Signed)
Called message left with information concerning Magnesium lab results and new medication encouraged to call for any questions concerns and/or changes

## 2020-06-19 NOTE — Telephone Encounter (Signed)
-----   Message from Owens Shark, NP sent at 06/19/2020  3:51 PM EST ----- Please let his wife know magnesium level is low.  I am sending a prescription to his pharmacy for magnesium oxide 400 mg twice daily.

## 2020-06-28 ENCOUNTER — Other Ambulatory Visit: Payer: Self-pay | Admitting: Oncology

## 2020-06-29 ENCOUNTER — Other Ambulatory Visit: Payer: Self-pay | Admitting: Cardiovascular Disease

## 2020-07-02 ENCOUNTER — Inpatient Hospital Stay: Payer: Medicaid Other

## 2020-07-02 ENCOUNTER — Other Ambulatory Visit: Payer: Self-pay

## 2020-07-02 ENCOUNTER — Inpatient Hospital Stay (HOSPITAL_BASED_OUTPATIENT_CLINIC_OR_DEPARTMENT_OTHER): Payer: Medicaid Other | Admitting: Oncology

## 2020-07-02 VITALS — BP 140/80 | HR 88 | Temp 98.7°F | Resp 18 | Ht 70.0 in | Wt 235.7 lb

## 2020-07-02 DIAGNOSIS — C787 Secondary malignant neoplasm of liver and intrahepatic bile duct: Secondary | ICD-10-CM | POA: Diagnosis not present

## 2020-07-02 DIAGNOSIS — C189 Malignant neoplasm of colon, unspecified: Secondary | ICD-10-CM

## 2020-07-02 DIAGNOSIS — C187 Malignant neoplasm of sigmoid colon: Secondary | ICD-10-CM

## 2020-07-02 DIAGNOSIS — Z95828 Presence of other vascular implants and grafts: Secondary | ICD-10-CM

## 2020-07-02 DIAGNOSIS — Z5112 Encounter for antineoplastic immunotherapy: Secondary | ICD-10-CM | POA: Diagnosis not present

## 2020-07-02 LAB — CBC WITH DIFFERENTIAL (CANCER CENTER ONLY)
Abs Immature Granulocytes: 0.04 10*3/uL (ref 0.00–0.07)
Basophils Absolute: 0 10*3/uL (ref 0.0–0.1)
Basophils Relative: 0 %
Eosinophils Absolute: 0.5 10*3/uL (ref 0.0–0.5)
Eosinophils Relative: 5 %
HCT: 47.4 % (ref 39.0–52.0)
Hemoglobin: 14.6 g/dL (ref 13.0–17.0)
Immature Granulocytes: 0 %
Lymphocytes Relative: 33 %
Lymphs Abs: 3.5 10*3/uL (ref 0.7–4.0)
MCH: 27 pg (ref 26.0–34.0)
MCHC: 30.8 g/dL (ref 30.0–36.0)
MCV: 87.8 fL (ref 80.0–100.0)
Monocytes Absolute: 0.7 10*3/uL (ref 0.1–1.0)
Monocytes Relative: 6 %
Neutro Abs: 5.8 10*3/uL (ref 1.7–7.7)
Neutrophils Relative %: 56 %
Platelet Count: 184 10*3/uL (ref 150–400)
RBC: 5.4 MIL/uL (ref 4.22–5.81)
RDW: 15.4 % (ref 11.5–15.5)
WBC Count: 10.6 10*3/uL — ABNORMAL HIGH (ref 4.0–10.5)
nRBC: 0 % (ref 0.0–0.2)

## 2020-07-02 LAB — CMP (CANCER CENTER ONLY)
ALT: 50 U/L — ABNORMAL HIGH (ref 0–44)
AST: 35 U/L (ref 15–41)
Albumin: 3.8 g/dL (ref 3.5–5.0)
Alkaline Phosphatase: 139 U/L — ABNORMAL HIGH (ref 38–126)
Anion gap: 11 (ref 5–15)
BUN: 8 mg/dL (ref 6–20)
CO2: 23 mmol/L (ref 22–32)
Calcium: 8.9 mg/dL (ref 8.9–10.3)
Chloride: 105 mmol/L (ref 98–111)
Creatinine: 1.04 mg/dL (ref 0.61–1.24)
GFR, Estimated: >60 mL/min (ref >60)
Glucose, Bld: 228 mg/dL — ABNORMAL HIGH (ref 70–99)
Potassium: 3.8 mmol/L (ref 3.5–5.1)
Sodium: 139 mmol/L (ref 135–145)
Total Bilirubin: 0.4 mg/dL (ref 0.3–1.2)
Total Protein: 8.1 g/dL (ref 6.5–8.1)

## 2020-07-02 LAB — CEA (IN HOUSE-CHCC): CEA (CHCC-In House): 7.58 ng/mL — ABNORMAL HIGH (ref 0.00–5.00)

## 2020-07-02 LAB — MAGNESIUM: Magnesium: 1.8 mg/dL (ref 1.7–2.4)

## 2020-07-02 MED ORDER — ATROPINE SULFATE 1 MG/ML IJ SOLN
0.4000 mg | Freq: Once | INTRAMUSCULAR | Status: AC
Start: 2020-07-02 — End: 2020-07-02
  Administered 2020-07-02: 0.4 mg via INTRAVENOUS

## 2020-07-02 MED ORDER — PALONOSETRON HCL INJECTION 0.25 MG/5ML
INTRAVENOUS | Status: AC
  Filled 2020-07-02: qty 5

## 2020-07-02 MED ORDER — DEXAMETHASONE SODIUM PHOSPHATE 10 MG/ML IJ SOLN
INTRAMUSCULAR | Status: AC
  Filled 2020-07-02: qty 1

## 2020-07-02 MED ORDER — DEXAMETHASONE SODIUM PHOSPHATE 10 MG/ML IJ SOLN
5.0000 mg | Freq: Once | INTRAMUSCULAR | Status: AC
  Administered 2020-07-02: 5 mg via INTRAVENOUS

## 2020-07-02 MED ORDER — SODIUM CHLORIDE 0.9 % IV SOLN: 5.0000 mg | Freq: Once | INTRAVENOUS | Status: DC

## 2020-07-02 MED ORDER — SODIUM CHLORIDE 0.9% FLUSH
10.0000 mL | INTRAVENOUS | Status: DC | PRN
  Administered 2020-07-02: 10 mL
  Filled 2020-07-02: qty 10

## 2020-07-02 MED ORDER — SODIUM CHLORIDE 0.9 % IV SOLN
6.0000 mg/kg | Freq: Once | INTRAVENOUS | Status: AC
  Administered 2020-07-02: 600 mg via INTRAVENOUS
  Filled 2020-07-02: qty 20

## 2020-07-02 MED ORDER — PALONOSETRON HCL INJECTION 0.25 MG/5ML
0.2500 mg | Freq: Once | INTRAVENOUS | Status: AC
  Administered 2020-07-02: 0.25 mg via INTRAVENOUS

## 2020-07-02 MED ORDER — SODIUM CHLORIDE 0.9% FLUSH
10.0000 mL | INTRAVENOUS | Status: DC | PRN
  Administered 2020-07-02: 10 mL via INTRAVENOUS
  Filled 2020-07-02: qty 10

## 2020-07-02 MED ORDER — HEPARIN SOD (PORK) LOCK FLUSH 100 UNIT/ML IV SOLN
500.0000 [IU] | Freq: Once | INTRAVENOUS | Status: AC | PRN
  Administered 2020-07-02: 500 [IU]
  Filled 2020-07-02: qty 5

## 2020-07-02 MED ORDER — ATROPINE SULFATE 0.4 MG/ML IJ SOLN
INTRAMUSCULAR | Status: AC
  Filled 2020-07-02: qty 1

## 2020-07-02 MED ORDER — SODIUM CHLORIDE 0.9 % IV SOLN
Freq: Once | INTRAVENOUS | Status: AC
  Filled 2020-07-02: qty 250

## 2020-07-02 MED ORDER — SODIUM CHLORIDE 0.9 % IV SOLN
180.0000 mg/m2 | Freq: Once | INTRAVENOUS | Status: AC
  Administered 2020-07-02: 420 mg via INTRAVENOUS
  Filled 2020-07-02: qty 15

## 2020-07-02 NOTE — Progress Notes (Signed)
[facility] OFFICE PROGRESS NOTE   Diagnosis: Colon cancer  INTERVAL HISTORY:   [name] returns as scheduled.  He completed a first cycle of irinotecan/Panitumumab on 06/18/2020.  He reports abdominal pain following the chemotherapy.  No nausea/vomiting or diarrhea.  He has developed a pustular rash over the face.  No rash over the trunk.  Objective:  Vital signs in last 24 hours:  Blood pressure 140/80, pulse 88, temperature 98.7 F (37.1 C), temperature source Tympanic, resp. rate 18, height _0  (1.778 m), weight 235 lb 11.2 oz (106.9 kg), SpO2 100 %.    HEENT: No thrush, 1-2 mm ulcer of the left buccal mucosa Resp: Lungs clear bilaterally Cardio: Regular rate and rhythm GI: No hepatomegaly, nontender Vascular: No leg edema  Skin: Mild acne type rash at the forehead and surrounding the nose.  Portacath/PICC-without erythema  Lab Results:  Lab Results  Component Value Date   WBC 10.6 (H) 07/02/2020   HGB 14.6 07/02/2020   HCT 47.4 07/02/2020   MCV 87.8 07/02/2020   PLT 184 07/02/2020   NEUTROABS 5.8 07/02/2020    CMP  Lab Results  Component Value Date   NA 138 06/18/2020   K 3.8 06/18/2020   CL 104 06/18/2020   CO2 23 06/18/2020   GLUCOSE 310 (H) 06/18/2020   BUN 11 06/18/2020   CREATININE 1.16 06/18/2020   CALCIUM 9.1 06/18/2020   PROT 8.0 06/18/2020   ALBUMIN 3.8 06/18/2020   AST 30 06/18/2020   ALT 44 06/18/2020   ALKPHOS 139 (H) 06/18/2020   BILITOT 0.4 06/18/2020   GFRNONAA >60 06/18/2020   GFRAA >60 01/30/2020    Lab Results  Component Value Date   CEA1 8.99 (H) 06/11/2020    Lab Results  Component Value Date   INR 0.98 01/19/2016     Medications: I have reviewed the patient's current medications.   Assessment/Plan: 1. 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 CTschest, abdomen, and pelvis on 12/11/2015-indeterminate tiny  pulmonary nodules, multiple liver metastases, small nodes in the sigmoid mesocolon  3. Laparoscopic sigmoid colectomy, liver biopsy, Port-A-Cath placement 01/14/2016  4. Pathology sigmoid colon resection-colonic adenocarcinoma, 5.3 cm extending into pericolonic connective tissue, positive lymph-vascular invasion, positive perineural invasion, negative margins, metastatic carcinoma in9 of 28 lymph nodes  5. Pathology liver biopsy-metastatic colorectal adenocarcinoma  6. MSI stable; mismatch repair protein normal  7. 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 Cycle 1 FOLFOX 02/02/2016  9. Cycle 2 FOLFOX 02/15/2016  10. Cycle 3 FOLFOX 02/29/2016  11. Cycle 4 FOLFOX 03/14/2016  12. Cycle 5 FOLFOX 03/28/2016  13. Cycle 6 FOLFOX 04/11/2016 (oxaliplatin held secondary to thrombocytopenia)  14. 04/21/2016 restaging CTs-difficulty evaluating liver lesions due to hepatic steatosis. Stable right adrenal nodule. No adenopathy or local recurrence near the rectosigmoid anastomotic site.  15. Cycle 7 FOLFOX 04/25/2016  16. MRI liver 05/02/2016-partial improvement in hepatic metastases  17. Cycle 8 FOLFOX 05/10/2016  18. Cycle 9 FOLFOX 05/23/2016 (oxaliplatin held due to thrombocytopenia)  19. Cycle 10 FOLFOX 06/06/2016 (oxaliplatin dose reduced due to thrombocytopenia)  20. Cycle 11 FOLFOX 06/27/2016 (oxaliplatin held due to neuropathy)  21. Cycle 12 FOLFOX 07/11/2016 (oxaliplatin held) 22. Initiation of maintenance Xeloda 7 days on/7 days off 07/27/2016 23. MRI liver 11/18/2016-decrease in hepatic metastatic disease. No new or progressive disease identified within the abdomen. 24. Continuation of Xeloda 7 days on/7 days off 25. MRI liver 04/27/2017-previous liver  lesions not identified, no new lesions, no lymphadenopathy 26. Xeloda continued 7 days on/7 days off 27. MRI liver 12/04/2017 -no evidence of metastatic disease, hepatic steatosis 28. Xeloda continued 7 days on/7 days off 29. MRI  liver 07/15/2018-no evidence of metastatic disease. Stable severe hepatic steatosis. 30. Xeloda continued 7 days on/7 days off 31. MRI liver 03/16/2019-hepatic steatosis, no liver mass, focal area of intrahepatic biliary dilatation in segments 2 and 3 of the left lobe-increased 32. Xeloda continued 7 days on/7 days off 33. MRI abdomen 08/19/2019-no findings to suggest liver metastases. Bilateral lung nodules measuring up to 2.8 cm, progressive and more conspicuous than on previous exam 34. CT chest 08/29/2019-multiple pulmonary metastases, new from 04/21/2016 35. Cycle 1 FOLFIRI/bevacizumab 09/09/2019 36. Cycle 2 FOLFIRI/bevacizumab 09/26/2019 37. Cycle 3 FOLFIRI/bevacizumab 10/10/2019 38. Cycle 4 FOLFIRI/bevacizumab 10/24/2019 39. Cycle 5 FOLFIRI/bevacizumab 11/07/2019 40. CT chest 11/14/2019-decreased size of lung nodules, no new lesions, hepatic steatosis 41. Cycle 6 FOLFIRI/bevacizumab 11/21/2019 42. Cycle 7 FOLFIRI/bevacizumab 12/05/2019 43. Cycle 8 FOLFIRI/bevacizumab 12/19/2019 44. Cycle 9 FOLFIRI/bevacizumab 01/02/2020 45. Cycle 10 FOLFIRI/bevacizumab 01/16/2020 46. CT chest 01/29/2020-stable bilateral pulmonary metastases. No new or progressive metastatic disease in the chest. 47. Cycle 11 FOLFIRI/bevacizumab 01/30/2020 48. Cycle 12 FOLFIRI/bevacizumab 02/19/2020 49. Cycle 13 FOLFIRI/bevacizumab 03/12/2020 50. Cycle 14 FOLFIRI/bevacizumab 04/02/2020 51. Cycle 15 FOLFIRI/bevacizumab 04/23/2020 52. Cycle 16 FOLFIRI/bevacizumab 05/21/2020 53. CT chest 06/09/2020-mild progression pulmonary metastasis.  Some lesions have increased in size while others are similar. 54. Cycle 1 irinotecan/Panitumumab 06/18/2020 55. Cycle 2 irinotecan/Panitumumab 07/02/2020  2. Rectal bleeding and constipation secondary to #1  3. History of peripheral vascular disease, status post left lower extremity vascular bypass surgery in April 2017  4. History of nephrolithiasis  5. History of Graves' disease treated with  radioactive iodine  6. Anxiety/depression  7. Hypertension  8. Hospitalization 01/19/2016 with wound dehiscence status post secondary suture closure of abdominal wall  9. 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 Hyperglycemia 06/20/2016-diagnosed with diabetes, maintained on insulin  11.Positive COVID test 12/13/2018  12. Hospitalized with seizure activity/DKA. Now on Keppra, insulin adjusted. 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  He tolerated the first treatment with irinotecan and Panitumumab well.  He has a mild rash over the face.  He will call for progression of the rash.  We will prescribe hydrocortisone cream as needed.  Jeff [name] will return for an office visit and chemotherapy in 2 weeks.  Betsy Coder, MD  07/02/2020  9:42 AM

## 2020-07-02 NOTE — Patient Instructions (Signed)
Implanted Port Insertion, Care After This sheet gives you information about how to care for yourself after your procedure. Your health care provider may also give you more specific instructions. If you have problems or questions, contact your health care provider. What can I expect after the procedure? After the procedure, it is common to have:  Discomfort at the port insertion site.  Bruising on the skin over the port. This should improve over 3-4 days. Follow these instructions at home: Port care  After your port is placed, you will get a manufacturer's information card. The card has information about your port. Keep this card with you at all times.  Take care of the port as told by your health care provider. Ask your health care provider if you or a family member can get training for taking care of the port at home. A home health care nurse may also take care of the port.  Make sure to remember what type of port you have. Incision care  Follow instructions from your health care provider about how to take care of your port insertion site. Make sure you: ? Wash your hands with soap and water before and after you change your bandage (dressing). If soap and water are not available, use hand sanitizer. ? Change your dressing as told by your health care provider. ? Leave stitches (sutures), skin glue, or adhesive strips in place. These skin closures may need to stay in place for 2 weeks or longer. If adhesive strip edges start to loosen and curl up, you may trim the loose edges. Do not remove adhesive strips completely unless your health care provider tells you to do that.  Check your port insertion site every day for signs of infection. Check for: ? Redness, swelling, or pain. ? Fluid or blood. ? Warmth. ? Pus or a bad smell.      Activity  Return to your normal activities as told by your health care provider. Ask your health care provider what activities are safe for you.  Do not  lift anything that is heavier than 10 lb (4.5 kg), or the limit that you are told, until your health care provider says that it is safe. General instructions  Take over-the-counter and prescription medicines only as told by your health care provider.  Do not take baths, swim, or use a hot tub until your health care provider approves. Ask your health care provider if you may take showers. You may only be allowed to take sponge baths.  Do not drive for 24 hours if you were given a sedative during your procedure.  Wear a medical alert bracelet in case of an emergency. This will tell any health care providers that you have a port.  Keep all follow-up visits as told by your health care provider. This is important. Contact a health care provider if:  You cannot flush your port with saline as directed, or you cannot draw blood from the port.  You have a fever or chills.  You have redness, swelling, or pain around your port insertion site.  You have fluid or blood coming from your port insertion site.  Your port insertion site feels warm to the touch.  You have pus or a bad smell coming from the port insertion site. Get help right away if:  You have chest pain or shortness of breath.  You have bleeding from your port that you cannot control. Summary  Take care of the port as told by your   health care provider. Keep the manufacturer's information card with you at all times.  Change your dressing as told by your health care provider.  Contact a health care provider if you have a fever or chills or if you have redness, swelling, or pain around your port insertion site.  Keep all follow-up visits as told by your health care provider. This information is not intended to replace advice given to you by your health care provider. Make sure you discuss any questions you have with your health care provider. Document Revised: 11/28/2017 Document Reviewed: 11/28/2017 Elsevier Patient Education   2021 [employer]  

## 2020-07-02 NOTE — Patient Instructions (Signed)
[facility] Discharge Instructions for Patients Receiving Chemotherapy  Today you received the following immunotherapy agent: Panitumumab (Vectibix) and chemotherapy agent: Irinotecan  To help prevent nausea and vomiting after your treatment, we encourage you to take your nausea medication as directed by your MD.   If you develop nausea and vomiting that is not controlled by your nausea medication, call the clinic.   BELOW ARE SYMPTOMS THAT SHOULD BE REPORTED IMMEDIATELY:  *FEVER GREATER THAN 100.5 F  *CHILLS WITH OR WITHOUT FEVER  NAUSEA AND VOMITING THAT IS NOT CONTROLLED WITH YOUR NAUSEA MEDICATION  *UNUSUAL SHORTNESS OF BREATH  *UNUSUAL BRUISING OR BLEEDING  TENDERNESS IN MOUTH AND THROAT WITH OR WITHOUT PRESENCE OF ULCERS  *URINARY PROBLEMS  *BOWEL PROBLEMS  UNUSUAL RASH Items with * indicate a potential emergency and should be followed up as soon as possible.  Feel free to call the clinic should you have any questions or concerns. The clinic phone number is [phone].  Please show the CHEMO ALERT CARD at check-in to the Emergency Department and triage nurse.   

## 2020-07-03 ENCOUNTER — Telehealth: Payer: Self-pay | Admitting: Oncology

## 2020-07-03 NOTE — Telephone Encounter (Signed)
Scheduled appointments per 2/17 los. Called patient, no answer. Left message with appointments dates and times.

## 2020-07-06 ENCOUNTER — Other Ambulatory Visit: Payer: Self-pay

## 2020-07-06 ENCOUNTER — Encounter: Payer: Self-pay | Admitting: Neurology

## 2020-07-06 ENCOUNTER — Ambulatory Visit: Payer: Medicaid Other | Admitting: Neurology

## 2020-07-06 VITALS — BP 135/94 | HR 136 | Ht 70.0 in | Wt 238.4 lb

## 2020-07-06 DIAGNOSIS — R400 Somnolence: Secondary | ICD-10-CM

## 2020-07-06 DIAGNOSIS — G40309 Generalized idiopathic epilepsy and epileptic syndromes, not intractable, without status epilepticus: Secondary | ICD-10-CM

## 2020-07-06 MED ORDER — LEVETIRACETAM 750 MG PO TABS: 750.0000 mg | ORAL_TABLET | Freq: Two times a day (BID) | ORAL | 3 refills | Status: DC

## 2020-07-06 NOTE — Progress Notes (Signed)
NEUROLOGY FOLLOW UP OFFICE NOTE  Jeff [name] [id number] July 22, 1965  HISTORY OF PRESENT ILLNESS: I had the pleasure of seeing Jeff [name] in follow-up in the neurology clinic on 07/06/2020.  The patient was last seen 4 months for seizures. He is again accompanied by his wife who helps supplement the history today.  Records and images were personally reviewed where available.  Since his last visit, he had another seizure on 04/08/20 while walking into a restaurant. He was brought to the ER where CBG was 170. Per EMS notes, he did not take the Dewy Rose that day. Keppra level was <1.0. He was instructed to increase Levetiracetam to 750mg  BID. He had a 24-hour EEG in 04/2020 which was abnormal, with occasional generalized 4-5 Hz spike and wave discharges consistent with a primary generalized epilepsy.  He and his wife deny any further seizures since 03/2020. They deny any staring/unresponsive episodes, gaps in time, olfactory/gustatory hallucinations, focal numbness/tingling/weakness, myoclonic jerks. He has infrequent headaches. He has occasional vertigo and takes prn meclizine. He does not sleep well, he would be up all night then sleeps for 4 hours after his wife leave for work. His wife notes snoring and daytime drowsiness. He recalls trying sleep aids in the past with no effect. Glucose levels are much better, highest of 190 per wife. Mood is good.   History on Initial Assessment 11/14/2019: This is a 55 year old right-handed man with a history of hypertension, diabetes, colon cancer on chemotherapy, Graves disease s/p radioactive iodine, PVD, s/p bypass graft, chronic thrombocytopenia, presenting for evaluation of new onset seizure last 10/25/2019. He received chemotherapy then later that evening he lay down at bed then came to the kitchen twice, acting unusually. His wife reports he opened his chemo pouch and was messing with his port. She kept reminding him to stop touching it, he said "yeah," and  she asked if it was hurting. He went back to the bedroom and she said good night, then heard a noise and found him unresponsive. He started yelling, both hands were flexed to his face that he scratched his face. He was turned to his right side and confused after. The seizure lasted a few minutes. He had 2 more seizures witnessed by EMS and was given Versed. He was initially brought to [facility] where glucose level was 566, he was hypotensive with SBP in the 80s and transferred to [facility]. He had an MRI brain which did not show any acute changes. His EEG showed intermittent generalized 4-5 Hz theta slowing with frequent spikes over the right frontocentral region, maximal at F4. He was discharged home on Levetiracetam 500mg  BID, he denies any side effects. He and his wife deny any prior history of seizures but recall 2 episodes of loss of consciousness, he was in thyroid storm one time. Another time 11 years ago he lost his memory, he was going to [facility] but ended up going to [place], he passed out and then apparently walked out of the hospital, they found him on a train track. His ammonia level was high that time. She denies any staring/unresponsive episodes. He denies any gaps in time. Sometimes he smells Clorox or hand sanitizer. He has been very sensitive to smells, smelling Clorox since diagnosed with cancer. He has hypnic jerks in sleep that wake him up, none during the day.   Since the seizure, he has had bilateral shoulder/arm pain, with sharp pains when he tries to lift them up. His glucose levels are  better with insulin dose changes. He has paresthesias in both feet. He has occasional headaches but has been complaining of them more recently, with pain over the temples. He has nausea/vomiting with chemotherapy. He has occasional sensation of food getting stuck in his throat. He has chronic back pain. No bowel/bladder dysfunction. Memory is okay. He denies any alcohol use. He has sleep  difficulties, melatonin does not help. He had a normal birth and early development.  There is no history of febrile convulsions, CNS infections such as meningitis/encephalitis, significant traumatic brain injury, neurosurgical procedures, or family history of seizures.   PAST MEDICAL HISTORY: Past Medical History:  Diagnosis Date  . Allergic rhinitis   . Anxiety   . Arthritis   . Asthma   . At risk for sleep apnea    STOP-BANG= 6       SENT TO PCP 01-22-2015  . Chronic back pain    "from the neck to the lower back"   . 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 Depression   . Dysthymic disorder   . ED (erectile dysfunction)   . GERD (gastroesophageal reflux disease)   . History of acute pyelonephritis    01-07-2015  . History of Graves' disease    vs  Multinodular  . History of hiatal hernia   . History of kidney stones   . History of non-ST elevation myocardial infarction (NSTEMI)    Jan 2014--  no CAD;  per notes probable coronary vasospasm  . History of panic attacks   . History of septic shock    01-07-2015--  ureterolithias/ pyelonephritis  . History of thyroid storm    Nov 2011  . Hyperlipidemia   . Hypertension   . Hypothyroidism following radioiodine therapy    RAI in Mar 2012---  followed by dr Loanne Drilling  . PAD (peripheral artery disease) [facility]) cardiologist-  dr Fletcher Anon   a.  ABI (01/2014):  L 0.53; R 1.0 >> referred to [facility]  . Right ureteral stone   . Septic shock ([place]) 01/05/2015  . Sleep disturbance   . Thrombocytopenia ([place])     MEDICATIONS: Current Outpatient Medications on File Prior to Visit  Medication Sig Dispense Refill  . ACCU-CHEK SOFTCLIX LANCETS lancets USE  TID TO CHECK BLOOD SUGAR LEVELS  0  . amLODipine (NORVASC) 10 MG tablet Take 10 mg by mouth daily.    Marland Kitchen aspirin EC 81 MG tablet Take 81 mg by mouth daily.    Marland Kitchen atorvastatin (LIPITOR) 80 MG  tablet Take 1 tablet (80 mg total) by mouth daily. 90 tablet 1  . clonazePAM (KLONOPIN) 1 MG tablet Take 1 mg by mouth 2 (two) times daily as needed.    . dapagliflozin propanediol (FARXIGA) 5 MG TABS tablet Take 5 mg by mouth daily.    Marland Kitchen docusate sodium (COLACE) 100 MG capsule Take 100 mg by mouth 2 (two) times daily.    Marland Kitchen doxycycline (VIBRA-TABS) 100 MG tablet Take 1 tablet (100 mg total) by mouth 2 (two) times daily. 60 tablet 3  . insulin aspart (NOVOLOG) 100 UNIT/ML injection Inject 12 Units into the skin 3 (three) times daily before meals. 12 mL 0  . Insulin Degludec (TRESIBA FLEXTOUCH [place]) Inject 90 Units into the skin daily.     Marland Kitchen levETIRAcetam (KEPPRA) 750 MG tablet Take 1 tablet (750 mg total) by mouth 2 (two) times daily. [place]  tablet 3  . levothyroxine (SYNTHROID) 150 MCG tablet Take 1 tablet by mouth once daily. **Must be seen in office for future refills** (Patient taking differently: Take 150 mcg by mouth daily before breakfast. Take 1 tablet by mouth once daily. **Must be seen in office for future refills**) 30 tablet 0  . lidocaine-prilocaine (EMLA) cream Apply 1 application topically as needed. Apply to portacath site 1 hour prior to use 30 g 3  . LORazepam (ATIVAN) 0.5 MG tablet Take 1 tablet (0.5 mg total) by mouth every 8 (eight) hours as needed for anxiety or sleep (nausea). 30 tablet 0  . magnesium oxide (MAG-OX) 400 (241.3 Mg) MG tablet Take 1 tablet (400 mg total) by mouth 2 (two) times daily. 60 tablet 3  . meclizine (ANTIVERT) 25 MG tablet Take 25 mg by mouth 3 (three) times daily as needed for dizziness.  1  . metFORMIN (GLUCOPHAGE-XR) 500 MG 24 hr tablet Take 1,000 mg by mouth in the morning and at bedtime.   3  . metoprolol tartrate (LOPRESSOR) 50 MG tablet Take 1 tablet (50 mg total) by mouth 2 (two) times daily. 180 tablet 3  . nitroGLYCERIN (NITROSTAT) 0.4 MG SL tablet PLACE 1 TABLET UNDER THE TONGUE EVERY 5 MINUTES AS NEEDED FOR CHEST PAIN. (Patient not taking: Reported  on 07/02/2020) 25 tablet 2  . OLANZapine (ZYPREXA) 5 MG tablet TAKE 1 TABLET BY MOUTH DAILY FOR 3 DAYS, STARTING ON DAY OF PUMP DISCONTINUE FOR EACH CHEMO( 2 CYCLES PER MONTH) 6 tablet 1  . ondansetron (ZOFRAN) 8 MG tablet Take 1 tablet (8 mg total) by mouth every 8 (eight) hours as needed for nausea or vomiting. Begin 72 hours after chemo treatment 30 tablet 1  . oxyCODONE-acetaminophen (PERCOCET) 10-325 MG tablet Take 1 tablet by mouth every 6 (six) hours as needed for pain (for back pain). 30 tablet 0  . pantoprazole (PROTONIX) 40 MG tablet Take 40 mg by mouth daily.   5  . promethazine (PHENERGAN) 12.5 MG tablet Take 1 tablet (12.5 mg total) by mouth every 6 (six) hours as needed. 30 tablet 2  . tamsulosin (FLOMAX) 0.4 MG CAPS capsule Take 0.4 mg by mouth every evening.     . venlafaxine XR (EFFEXOR-XR) 75 MG 24 hr capsule Take 225 mg by mouth daily.     Current Facility-Administered Medications on File Prior to Visit  Medication Dose Route Frequency Provider Last Rate Last Admin  . alteplase (CATHFLO ACTIVASE) injection 2 mg  2 mg Intracatheter Once PRN Betsy Coder B, MD      . sodium chloride flush (NS) 0.9 % injection 10 mL  10 mL Intravenous PRN Ladell Pier, MD   10 mL at 02/04/16 1431  . sodium chloride flush (NS) 0.9 % injection 10 mL  10 mL Intravenous PRN Ladell Pier, MD   10 mL at 10/18/16 7035  . sodium chloride flush (NS) 0.9 % injection 10 mL  10 mL Intravenous PRN Ladell Pier, MD   10 mL at 05/03/17 0855    ALLERGIES: Allergies  Allergen Reactions  . Hydrocodone Hives    FAMILY HISTORY: Family History  Problem Relation Age of Onset  . Thyroid disease Mother        hypothyroidism  . Heart attack Maternal Grandfather   . Heart Problems Father        pacermaker  . Edema Father   . Heart disease Maternal Grandmother   . Lung cancer Maternal Grandmother   .  Diabetes Maternal Grandmother   . Hypertension Maternal Grandmother     SOCIAL  HISTORY: Social History   Socioeconomic History  . Marital status: Married    Spouse name: Not on file  . Number of children: 5  . Years of education: Not on file  . Highest education level: Not on file  Occupational History  . Occupation: Disabled    Employer: UNEMPLOYED  Tobacco Use  . Smoking status: Never Smoker  . Smokeless tobacco: Never Used  Vaping Use  . Vaping Use: Never used  Substance and Sexual Activity  . Alcohol use: Not Currently    Alcohol/week: 0.0 standard drinks    Comment: RARE  . Drug use: No  . Sexual activity: Not on file  Other Topics Concern  . Not on file  Social History Narrative   Married - wife works neurosciences unit @ [employer]   5 daughters   Regular exercise-no   Disabled to due back problem   12/04/2015   Right handed    Social Determinants of Health   Financial Resource Strain: Not on file  Food Insecurity: Not on file  Transportation Needs: Not on file  Physical Activity: Not on file  Stress: Not on file  Social Connections: Not on file  Intimate Partner Violence: Not on file     PHYSICAL EXAM: Vitals:   07/06/20 1122  BP: (!) 135/94  Pulse: (!) 136  SpO2: 99%   General: No acute distress Head:  Normocephalic/atraumatic Skin/Extremities: No rash, no edema Neurological Exam: alert and awake. No aphasia or dysarthria. Fund of knowledge is appropriate.  Recent and remote memory are intact.  Attention and concentration are normal.   Cranial nerves: Pupils equal, round. Extraocular movements intact with no nystagmus. Visual fields full.  No facial asymmetry.  Motor: Bulk and tone normal, muscle strength 5/5 throughout with no pronator drift, left shoulder still painful.  Finger to nose testing intact.  Gait narrow-based and steady, mild difficulty with tandem walk. Romberg slight sway   IMPRESSION: This is a 55 yo RH man with a history of hypertension, diabetes, colon cancer on chemotherapy, Graves disease s/p radioactive iodine,  PVD, s/p bypass graft, chronic thrombocytopenia, with new onset seizure in June 2021. Glucose levels were significantly hight at that time, his EEG was abnormal with frequent spikes over the right frontocentral region, maximal at F4. MRI brain unremarkable. He had another seizure in November 2021. His 24-hour EEG showed generalized 4-5 Hz spike and wave discharges consistent with a primary generalized epilepsy. He is on Levetiracetam 750mg  BID without side effects. He reports poor sleep quality, daytime drowsiness, snoring. A home sleep study will be ordered to assess for sleep apnea. We again discussed [place] driving laws to stop driving until 6 months seizure-free. Follow-up in 6 months, they know to call for any changes.   Thank you for allowing me to participate in his care.  Please do not hesitate to call for any questions or concerns.   Ellouise Newer, M.D.   CC: Lucita Lora, NP

## 2020-07-06 NOTE — Patient Instructions (Signed)
1. Continue Keppra 750mg  twice a day  2. Schedule home sleep study  3. Follow-up in 6 months, call for any changes  Seizure Precautions: 1. If medication has been prescribed for you to prevent seizures, take it exactly as directed.  Do not stop taking the medicine without talking to your doctor first, even if you have not had a seizure in a long time.   2. Avoid activities in which a seizure would cause danger to yourself or to others.  Don't operate dangerous machinery, swim alone, or climb in high or dangerous places, such as on ladders, roofs, or girders.  Do not drive unless your doctor says you may.  3. If you have any warning that you may have a seizure, lay down in a safe place where you can't hurt yourself.    4.  No driving for 6 months from last seizure, as per [facility].   Please refer to the following link on the [facility] website for more information: http://[email or web address]/[email or web address]/driving/drivingu.cfm   5.  Maintain good sleep hygiene. Avoid alcohol.  6.  Contact your doctor if you have any problems that may be related to the medicine you are taking.  7.  Call 911 and bring the patient back to the ED if:        A.  The seizure lasts longer than 5 minutes.       B.  The patient doesn't awaken shortly after the seizure  C.  The patient has new problems such as difficulty seeing, speaking or moving  D.  The patient was injured during the seizure  E.  The patient has a temperature over 102 F (39C)  F.  The patient vomited and now is having trouble breathing

## 2020-07-08 ENCOUNTER — Telehealth: Payer: Self-pay | Admitting: *Deleted

## 2020-07-08 MED ORDER — HYDROCORTISONE 2.5 % EX CREA: TOPICAL_CREAM | Freq: Every day | CUTANEOUS | 1 refills | Status: DC

## 2020-07-08 NOTE — Telephone Encounter (Signed)
Left VM that patient wants a topical for his facial rash. Per Dr. Benay Spice: OK to call in hydrocortisone 2.5 % cream. Called wife back and left message re: new script sent.

## 2020-07-12 ENCOUNTER — Other Ambulatory Visit: Payer: Self-pay | Admitting: Oncology

## 2020-07-16 ENCOUNTER — Telehealth: Payer: Self-pay | Admitting: Oncology

## 2020-07-16 ENCOUNTER — Inpatient Hospital Stay: Payer: Medicaid Other | Attending: Oncology

## 2020-07-16 ENCOUNTER — Other Ambulatory Visit: Payer: Self-pay

## 2020-07-16 ENCOUNTER — Inpatient Hospital Stay (HOSPITAL_BASED_OUTPATIENT_CLINIC_OR_DEPARTMENT_OTHER): Payer: Medicaid Other | Admitting: Oncology

## 2020-07-16 ENCOUNTER — Inpatient Hospital Stay: Payer: Medicaid Other

## 2020-07-16 VITALS — BP 134/80 | HR 99 | Temp 97.8°F | Resp 18 | Ht 70.0 in | Wt 240.5 lb

## 2020-07-16 DIAGNOSIS — C787 Secondary malignant neoplasm of liver and intrahepatic bile duct: Secondary | ICD-10-CM | POA: Insufficient documentation

## 2020-07-16 DIAGNOSIS — C187 Malignant neoplasm of sigmoid colon: Secondary | ICD-10-CM

## 2020-07-16 DIAGNOSIS — C7802 Secondary malignant neoplasm of left lung: Secondary | ICD-10-CM | POA: Insufficient documentation

## 2020-07-16 DIAGNOSIS — Z5112 Encounter for antineoplastic immunotherapy: Secondary | ICD-10-CM | POA: Insufficient documentation

## 2020-07-16 DIAGNOSIS — Z5111 Encounter for antineoplastic chemotherapy: Secondary | ICD-10-CM | POA: Diagnosis present

## 2020-07-16 DIAGNOSIS — C189 Malignant neoplasm of colon, unspecified: Secondary | ICD-10-CM

## 2020-07-16 DIAGNOSIS — C7801 Secondary malignant neoplasm of right lung: Secondary | ICD-10-CM | POA: Insufficient documentation

## 2020-07-16 LAB — CBC WITH DIFFERENTIAL (CANCER CENTER ONLY)
Abs Immature Granulocytes: 0.05 10*3/uL (ref 0.00–0.07)
Basophils Absolute: 0 10*3/uL (ref 0.0–0.1)
Basophils Relative: 0 %
Eosinophils Absolute: 0.2 10*3/uL (ref 0.0–0.5)
Eosinophils Relative: 3 %
HCT: 45.1 % (ref 39.0–52.0)
Hemoglobin: 13.9 g/dL (ref 13.0–17.0)
Immature Granulocytes: 1 %
Lymphocytes Relative: 37 %
Lymphs Abs: 3.1 10*3/uL (ref 0.7–4.0)
MCH: 27.1 pg (ref 26.0–34.0)
MCHC: 30.8 g/dL (ref 30.0–36.0)
MCV: 87.9 fL (ref 80.0–100.0)
Monocytes Absolute: 0.7 10*3/uL (ref 0.1–1.0)
Monocytes Relative: 8 %
Neutro Abs: 4.4 10*3/uL (ref 1.7–7.7)
Neutrophils Relative %: 51 %
Platelet Count: 150 10*3/uL (ref 150–400)
RBC: 5.13 MIL/uL (ref 4.22–5.81)
RDW: 15.3 % (ref 11.5–15.5)
WBC Count: 8.4 10*3/uL (ref 4.0–10.5)
nRBC: 0 % (ref 0.0–0.2)

## 2020-07-16 LAB — CMP (CANCER CENTER ONLY)
ALT: 57 U/L — ABNORMAL HIGH (ref 0–44)
AST: 37 U/L (ref 15–41)
Albumin: 3.7 g/dL (ref 3.5–5.0)
Alkaline Phosphatase: 120 U/L (ref 38–126)
Anion gap: 13 (ref 5–15)
BUN: 8 mg/dL (ref 6–20)
CO2: 22 mmol/L (ref 22–32)
Calcium: 8.9 mg/dL (ref 8.9–10.3)
Chloride: 103 mmol/L (ref 98–111)
Creatinine: 1.11 mg/dL (ref 0.61–1.24)
GFR, Estimated: >60 mL/min (ref >60)
Glucose, Bld: 278 mg/dL — ABNORMAL HIGH (ref 70–99)
Potassium: 3.8 mmol/L (ref 3.5–5.1)
Sodium: 138 mmol/L (ref 135–145)
Total Bilirubin: 0.4 mg/dL (ref 0.3–1.2)
Total Protein: 7.6 g/dL (ref 6.5–8.1)

## 2020-07-16 LAB — MAGNESIUM: Magnesium: 1.7 mg/dL (ref 1.7–2.4)

## 2020-07-16 MED ORDER — ATROPINE SULFATE 1 MG/ML IJ SOLN
0.4000 mg | Freq: Once | INTRAMUSCULAR | Status: AC
Start: 2020-07-16 — End: 2020-07-16
  Administered 2020-07-16: 0.4 mg via INTRAVENOUS

## 2020-07-16 MED ORDER — DEXAMETHASONE SODIUM PHOSPHATE 10 MG/ML IJ SOLN
5.0000 mg | Freq: Once | INTRAMUSCULAR | Status: AC
  Administered 2020-07-16: 5 mg via INTRAVENOUS

## 2020-07-16 MED ORDER — SODIUM CHLORIDE 0.9 % IV SOLN
180.0000 mg/m2 | Freq: Once | INTRAVENOUS | Status: AC
  Administered 2020-07-16: 420 mg via INTRAVENOUS
  Filled 2020-07-16: qty 15

## 2020-07-16 MED ORDER — SODIUM CHLORIDE 0.9% FLUSH
10.0000 mL | INTRAVENOUS | Status: DC | PRN
  Administered 2020-07-16: 10 mL
  Filled 2020-07-16: qty 10

## 2020-07-16 MED ORDER — SODIUM CHLORIDE 0.9 % IV SOLN
Freq: Once | INTRAVENOUS | Status: AC
  Filled 2020-07-16: qty 250

## 2020-07-16 MED ORDER — SODIUM CHLORIDE 0.9 % IV SOLN: 5.0000 mg | Freq: Once | INTRAVENOUS | Status: DC

## 2020-07-16 MED ORDER — PALONOSETRON HCL INJECTION 0.25 MG/5ML
INTRAVENOUS | Status: AC
  Filled 2020-07-16: qty 5

## 2020-07-16 MED ORDER — HEPARIN SOD (PORK) LOCK FLUSH 100 UNIT/ML IV SOLN
500.0000 [IU] | Freq: Once | INTRAVENOUS | Status: AC | PRN
  Administered 2020-07-16: 500 [IU]
  Filled 2020-07-16: qty 5

## 2020-07-16 MED ORDER — PALONOSETRON HCL INJECTION 0.25 MG/5ML
0.2500 mg | Freq: Once | INTRAVENOUS | Status: AC
  Administered 2020-07-16: 0.25 mg via INTRAVENOUS

## 2020-07-16 MED ORDER — SODIUM CHLORIDE 0.9 % IV SOLN
6.0000 mg/kg | Freq: Once | INTRAVENOUS | Status: AC
  Administered 2020-07-16: 600 mg via INTRAVENOUS
  Filled 2020-07-16: qty 20

## 2020-07-16 MED ORDER — ATROPINE SULFATE 1 MG/ML IJ SOLN
INTRAMUSCULAR | Status: AC
  Filled 2020-07-16: qty 1

## 2020-07-16 MED ORDER — DEXAMETHASONE SODIUM PHOSPHATE 10 MG/ML IJ SOLN
INTRAMUSCULAR | Status: AC
  Filled 2020-07-16: qty 1

## 2020-07-16 NOTE — Progress Notes (Signed)
[place] OFFICE PROGRESS NOTE   Diagnosis: Colon cancer  INTERVAL HISTORY:   Jeff [name] completed another cycle of irinotecan/Panitumumab on 07/02/2020.  No mouth sores, nausea, or diarrhea.  He has a rash over the face.  Hydrocortisone cream helps.  No seizures.  Objective:  Vital signs in last 24 hours:  Blood pressure 134/80, pulse 99, temperature 97.8 F (36.6 C), temperature source Tympanic, resp. rate 18, height $RemoveBe'5\' 10"'oqVyEWbqQ$  (1.778 m), weight 240 lb 8 oz (109.1 kg), SpO2 100 %.    HEENT: No thrush or ulcers Resp: Lungs clear bilaterally Cardio: Regular rate and rhythm GI: No hepatomegaly Vascular: No leg edema  Skin: Acne type rash surrounding the nose, mild pustular rash at the lower neck and chest  Portacath/PICC-without erythema  Lab Results:  Lab Results  Component Value Date   WBC 8.4 07/16/2020   HGB 13.9 07/16/2020   HCT 45.1 07/16/2020   MCV 87.9 07/16/2020   PLT 150 07/16/2020   NEUTROABS 4.4 07/16/2020    CMP  Lab Results  Component Value Date   NA 139 07/02/2020   K 3.8 07/02/2020   CL 105 07/02/2020   CO2 23 07/02/2020   GLUCOSE 228 (H) 07/02/2020   BUN 8 07/02/2020   CREATININE 1.04 07/02/2020   CALCIUM 8.9 07/02/2020   PROT 8.1 07/02/2020   ALBUMIN 3.8 07/02/2020   AST 35 07/02/2020   ALT 50 (H) 07/02/2020   ALKPHOS 139 (H) 07/02/2020   BILITOT 0.4 07/02/2020   GFRNONAA >60 07/02/2020   GFRAA >60 01/30/2020    Lab Results  Component Value Date   CEA1 7.58 (H) 07/02/2020    Medications: I have reviewed the patient's current medications.   Assessment/Plan:  1. 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 CTschest, abdomen, and pelvis on 12/11/2015-indeterminate tiny pulmonary nodules, multiple liver metastases, small nodes in the sigmoid mesocolon  3. Laparoscopic sigmoid colectomy, liver biopsy, Port-A-Cath placement  01/14/2016  4. Pathology sigmoid colon resection-colonic adenocarcinoma, 5.3 cm extending into pericolonic connective tissue, positive lymph-vascular invasion, positive perineural invasion, negative margins, metastatic carcinoma in9 of 28 lymph nodes  5. Pathology liver biopsy-metastatic colorectal adenocarcinoma  6. MSI stable; mismatch repair protein normal  7. 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 Cycle 1 FOLFOX 02/02/2016  9. Cycle 2 FOLFOX 02/15/2016  10. Cycle 3 FOLFOX 02/29/2016  11. Cycle 4 FOLFOX 03/14/2016  12. Cycle 5 FOLFOX 03/28/2016  13. Cycle 6 FOLFOX 04/11/2016 (oxaliplatin held secondary to thrombocytopenia)  14. 04/21/2016 restaging CTs-difficulty evaluating liver lesions due to hepatic steatosis. Stable right adrenal nodule. No adenopathy or local recurrence near the rectosigmoid anastomotic site.  15. Cycle 7 FOLFOX 04/25/2016  16. MRI liver 05/02/2016-partial improvement in hepatic metastases  17. Cycle 8 FOLFOX 05/10/2016  18. Cycle 9 FOLFOX 05/23/2016 (oxaliplatin held due to thrombocytopenia)  19. Cycle 10 FOLFOX 06/06/2016 (oxaliplatin dose reduced due to thrombocytopenia)  20. Cycle 11 FOLFOX 06/27/2016 (oxaliplatin held due to neuropathy)  21. Cycle 12 FOLFOX 07/11/2016 (oxaliplatin held) 22. Initiation of maintenance Xeloda 7 days on/7 days off 07/27/2016 23. MRI liver 11/18/2016-decrease in hepatic metastatic disease. No new or progressive disease identified within the abdomen. 24. Continuation of Xeloda 7 days on/7 days off 25. MRI liver 04/27/2017-previous liver lesions not identified, no new lesions, no lymphadenopathy 26. Xeloda continued 7 days on/7 days off 27. MRI liver 12/04/2017 -no evidence of metastatic disease, hepatic steatosis 28. Xeloda continued  7 days on/7 days off 29. MRI liver 07/15/2018-no evidence of metastatic disease. Stable severe hepatic steatosis. 30. Xeloda continued 7 days on/7 days off 31. MRI liver  03/16/2019-hepatic steatosis, no liver mass, focal area of intrahepatic biliary dilatation in segments 2 and 3 of the left lobe-increased 32. Xeloda continued 7 days on/7 days off 33. MRI abdomen 08/19/2019-no findings to suggest liver metastases. Bilateral lung nodules measuring up to 2.8 cm, progressive and more conspicuous than on previous exam 34. CT chest 08/29/2019-multiple pulmonary metastases, new from 04/21/2016 35. Cycle 1 FOLFIRI/bevacizumab 09/09/2019 36. Cycle 2 FOLFIRI/bevacizumab 09/26/2019 37. Cycle 3 FOLFIRI/bevacizumab 10/10/2019 38. Cycle 4 FOLFIRI/bevacizumab 10/24/2019 39. Cycle 5 FOLFIRI/bevacizumab 11/07/2019 40. CT chest 11/14/2019-decreased size of lung nodules, no new lesions, hepatic steatosis 41. Cycle 6 FOLFIRI/bevacizumab 11/21/2019 42. Cycle 7 FOLFIRI/bevacizumab 12/05/2019 43. Cycle 8 FOLFIRI/bevacizumab 12/19/2019 44. Cycle 9 FOLFIRI/bevacizumab 01/02/2020 45. Cycle 10 FOLFIRI/bevacizumab 01/16/2020 46. CT chest 01/29/2020-stable bilateral pulmonary metastases. No new or progressive metastatic disease in the chest. 47. Cycle 11 FOLFIRI/bevacizumab 01/30/2020 48. Cycle 12 FOLFIRI/bevacizumab 02/19/2020 49. Cycle 13 FOLFIRI/bevacizumab 03/12/2020 50. Cycle 14 FOLFIRI/bevacizumab 04/02/2020 51. Cycle 15 FOLFIRI/bevacizumab 04/23/2020 52. Cycle 16 FOLFIRI/bevacizumab 05/21/2020 53. CT chest 06/09/2020-mild progression pulmonary metastasis.  Some lesions have increased in size while others are similar. 54. Cycle 1 irinotecan/Panitumumab 06/18/2020 55. Cycle 2 irinotecan/Panitumumab 07/02/2020 56. Cycle three irinotecan/panitumumab 07-16-2020  2. Rectal bleeding and constipation secondary to #1  3. History of peripheral vascular disease, status post left lower extremity vascular bypass surgery in April 2017  4. History of nephrolithiasis  5. History of Graves' disease treated with radioactive iodine  6. Anxiety/depression  7. Hypertension  8. Hospitalization  01/19/2016 with wound dehiscence status post secondary suture closure of abdominal wall  9. 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 Hyperglycemia 06/20/2016-diagnosed with diabetes, maintained on insulin  11.Positive COVID test 12/13/2018  12. Hospitalized with seizure activity/DKA. Now on Keppra, insulin adjusted. 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  Recurrent seizures 04/08/2020-Keppra dose increased, CT brain without acute change      Disposition: Jeff [name] appears stable.  He is tolerating the irinotecan and Panitumumab well.  He will complete another cycle today.  He will return for an office visit and chemotherapy in 2 weeks.  He will be referred for a restaging CT evaluation after five or six cycles of irinotecan/Panitumumab.  Betsy Coder, MD  07/16/2020  9:14 AM

## 2020-07-16 NOTE — Telephone Encounter (Signed)
Scheduled per los. Declined printout  

## 2020-07-16 NOTE — Patient Instructions (Signed)
[place] [place] Discharge Instructions for Patients Receiving Chemotherapy  Today you received the following chemotherapy agents: Panitumumab (Vectibix) and Irinotecan  To help prevent nausea and vomiting after your treatment, we encourage you to take your nausea medication  as prescribed.    If you develop nausea and vomiting that is not controlled by your nausea medication, call the clinic.   BELOW ARE SYMPTOMS THAT SHOULD BE REPORTED IMMEDIATELY:  *FEVER GREATER THAN 100.5 F  *CHILLS WITH OR WITHOUT FEVER  NAUSEA AND VOMITING THAT IS NOT CONTROLLED WITH YOUR NAUSEA MEDICATION  *UNUSUAL SHORTNESS OF BREATH  *UNUSUAL BRUISING OR BLEEDING  TENDERNESS IN MOUTH AND THROAT WITH OR WITHOUT PRESENCE OF ULCERS  *URINARY PROBLEMS  *BOWEL PROBLEMS  UNUSUAL RASH Items with * indicate a potential emergency and should be followed up as soon as possible.  Feel free to call the clinic should you have any questions or concerns. The clinic phone number is [phone].  Please show the [place] [facility] at check-in to the Emergency Department and triage nurse.

## 2020-07-16 NOTE — Patient Instructions (Signed)

## 2020-07-26 ENCOUNTER — Other Ambulatory Visit: Payer: Self-pay | Admitting: Oncology

## 2020-07-26 ENCOUNTER — Other Ambulatory Visit: Payer: Self-pay | Admitting: Cardiovascular Disease

## 2020-07-26 DIAGNOSIS — I10 Essential (primary) hypertension: Secondary | ICD-10-CM

## 2020-07-27 NOTE — Telephone Encounter (Signed)
Refill Request.  

## 2020-07-30 ENCOUNTER — Inpatient Hospital Stay: Payer: Medicaid Other

## 2020-07-30 ENCOUNTER — Inpatient Hospital Stay (HOSPITAL_BASED_OUTPATIENT_CLINIC_OR_DEPARTMENT_OTHER): Payer: Medicaid Other | Admitting: Nurse Practitioner

## 2020-07-30 ENCOUNTER — Other Ambulatory Visit: Payer: Self-pay

## 2020-07-30 ENCOUNTER — Encounter: Payer: Self-pay | Admitting: Nurse Practitioner

## 2020-07-30 VITALS — BP 138/80 | HR 111 | Temp 97.6°F | Resp 18 | Ht 70.0 in | Wt 241.8 lb

## 2020-07-30 VITALS — HR 99

## 2020-07-30 DIAGNOSIS — C189 Malignant neoplasm of colon, unspecified: Secondary | ICD-10-CM | POA: Diagnosis not present

## 2020-07-30 DIAGNOSIS — Z5112 Encounter for antineoplastic immunotherapy: Secondary | ICD-10-CM | POA: Diagnosis not present

## 2020-07-30 DIAGNOSIS — C187 Malignant neoplasm of sigmoid colon: Secondary | ICD-10-CM

## 2020-07-30 DIAGNOSIS — C787 Secondary malignant neoplasm of liver and intrahepatic bile duct: Secondary | ICD-10-CM | POA: Diagnosis not present

## 2020-07-30 DIAGNOSIS — Z95828 Presence of other vascular implants and grafts: Secondary | ICD-10-CM

## 2020-07-30 LAB — CBC WITH DIFFERENTIAL (CANCER CENTER ONLY)
Abs Immature Granulocytes: 0.06 10*3/uL (ref 0.00–0.07)
Basophils Absolute: 0 10*3/uL (ref 0.0–0.1)
Basophils Relative: 0 %
Eosinophils Absolute: 0.2 10*3/uL (ref 0.0–0.5)
Eosinophils Relative: 2 %
HCT: 45.7 % (ref 39.0–52.0)
Hemoglobin: 14.2 g/dL (ref 13.0–17.0)
Immature Granulocytes: 1 %
Lymphocytes Relative: 39 %
Lymphs Abs: 3.5 10*3/uL (ref 0.7–4.0)
MCH: 27.2 pg (ref 26.0–34.0)
MCHC: 31.1 g/dL (ref 30.0–36.0)
MCV: 87.4 fL (ref 80.0–100.0)
Monocytes Absolute: 0.8 10*3/uL (ref 0.1–1.0)
Monocytes Relative: 8 %
Neutro Abs: 4.7 10*3/uL (ref 1.7–7.7)
Neutrophils Relative %: 50 %
Platelet Count: 205 10*3/uL (ref 150–400)
RBC: 5.23 MIL/uL (ref 4.22–5.81)
RDW: 15.5 % (ref 11.5–15.5)
WBC Count: 9.2 10*3/uL (ref 4.0–10.5)
nRBC: 0 % (ref 0.0–0.2)

## 2020-07-30 LAB — MAGNESIUM: Magnesium: 1.8 mg/dL (ref 1.7–2.4)

## 2020-07-30 LAB — CMP (CANCER CENTER ONLY)
ALT: 62 U/L — ABNORMAL HIGH (ref 0–44)
AST: 42 U/L — ABNORMAL HIGH (ref 15–41)
Albumin: 3.9 g/dL (ref 3.5–5.0)
Alkaline Phosphatase: 108 U/L (ref 38–126)
Anion gap: 12 (ref 5–15)
BUN: 8 mg/dL (ref 6–20)
CO2: 24 mmol/L (ref 22–32)
Calcium: 9 mg/dL (ref 8.9–10.3)
Chloride: 105 mmol/L (ref 98–111)
Creatinine: 1.07 mg/dL (ref 0.61–1.24)
GFR, Estimated: >60 mL/min (ref >60)
Glucose, Bld: 184 mg/dL — ABNORMAL HIGH (ref 70–99)
Potassium: 3.5 mmol/L (ref 3.5–5.1)
Sodium: 141 mmol/L (ref 135–145)
Total Bilirubin: 0.4 mg/dL (ref 0.3–1.2)
Total Protein: 7.8 g/dL (ref 6.5–8.1)

## 2020-07-30 LAB — CEA (IN HOUSE-CHCC): CEA (CHCC-In House): 3.7 ng/mL (ref 0.00–5.00)

## 2020-07-30 MED ORDER — HEPARIN SOD (PORK) LOCK FLUSH 100 UNIT/ML IV SOLN
500.0000 [IU] | Freq: Once | INTRAVENOUS | Status: AC | PRN
  Administered 2020-07-30: 500 [IU]
  Filled 2020-07-30: qty 5

## 2020-07-30 MED ORDER — PALONOSETRON HCL INJECTION 0.25 MG/5ML
INTRAVENOUS | Status: AC
  Filled 2020-07-30: qty 5

## 2020-07-30 MED ORDER — FLUTICASONE PROPIONATE 0.05 % EX CREA: TOPICAL_CREAM | Freq: Two times a day (BID) | CUTANEOUS | 2 refills | Status: DC

## 2020-07-30 MED ORDER — SODIUM CHLORIDE 0.9 % IV SOLN
6.0000 mg/kg | Freq: Once | INTRAVENOUS | Status: AC
  Administered 2020-07-30: 600 mg via INTRAVENOUS
  Filled 2020-07-30: qty 20

## 2020-07-30 MED ORDER — ATROPINE SULFATE 0.4 MG/ML IJ SOLN
INTRAMUSCULAR | Status: AC
  Filled 2020-07-30: qty 1

## 2020-07-30 MED ORDER — SODIUM CHLORIDE 0.9 % IV SOLN
180.0000 mg/m2 | Freq: Once | INTRAVENOUS | Status: AC
  Administered 2020-07-30: 420 mg via INTRAVENOUS
  Filled 2020-07-30: qty 15

## 2020-07-30 MED ORDER — DEXAMETHASONE SODIUM PHOSPHATE 10 MG/ML IJ SOLN
5.0000 mg | Freq: Once | INTRAMUSCULAR | Status: AC
  Administered 2020-07-30: 5 mg via INTRAVENOUS

## 2020-07-30 MED ORDER — LORAZEPAM 0.5 MG PO TABS: 0.5000 mg | ORAL_TABLET | Freq: Three times a day (TID) | ORAL | 0 refills | Status: DC | PRN

## 2020-07-30 MED ORDER — DOXYCYCLINE HYCLATE 100 MG PO TABS: 100.0000 mg | ORAL_TABLET | Freq: Two times a day (BID) | ORAL | 3 refills | Status: DC

## 2020-07-30 MED ORDER — DEXAMETHASONE SODIUM PHOSPHATE 10 MG/ML IJ SOLN
INTRAMUSCULAR | Status: AC
  Filled 2020-07-30: qty 1

## 2020-07-30 MED ORDER — ATROPINE SULFATE 1 MG/ML IJ SOLN
0.4000 mg | Freq: Once | INTRAMUSCULAR | Status: AC
  Administered 2020-07-30: 0.4 mg via INTRAVENOUS

## 2020-07-30 MED ORDER — PALONOSETRON HCL INJECTION 0.25 MG/5ML
0.2500 mg | Freq: Once | INTRAVENOUS | Status: AC
  Administered 2020-07-30: 0.25 mg via INTRAVENOUS

## 2020-07-30 MED ORDER — SODIUM CHLORIDE 0.9 % IV SOLN
Freq: Once | INTRAVENOUS | Status: AC
  Filled 2020-07-30: qty 250

## 2020-07-30 MED ORDER — ATROPINE SULFATE 1 MG/ML IJ SOLN
INTRAMUSCULAR | Status: AC
  Filled 2020-07-30: qty 1

## 2020-07-30 MED ORDER — SODIUM CHLORIDE 0.9% FLUSH
10.0000 mL | INTRAVENOUS | Status: DC | PRN
  Administered 2020-07-30: 10 mL
  Filled 2020-07-30: qty 10

## 2020-07-30 MED ORDER — SODIUM CHLORIDE 0.9% FLUSH
10.0000 mL | INTRAVENOUS | Status: DC | PRN
  Administered 2020-07-30: 10 mL via INTRAVENOUS
  Filled 2020-07-30: qty 10

## 2020-07-30 NOTE — Patient Instructions (Signed)

## 2020-07-30 NOTE — Patient Instructions (Signed)
[facility] Discharge Instructions for Patients Receiving Chemotherapy  Today you received the following chemotherapy agents: Panitumumab (Vectibix) and Irinotecan  To help prevent nausea and vomiting after your treatment, we encourage you to take your nausea medication  as prescribed.    If you develop nausea and vomiting that is not controlled by your nausea medication, call the clinic.   BELOW ARE SYMPTOMS THAT SHOULD BE REPORTED IMMEDIATELY:  *FEVER GREATER THAN 100.5 F  *CHILLS WITH OR WITHOUT FEVER  NAUSEA AND VOMITING THAT IS NOT CONTROLLED WITH YOUR NAUSEA MEDICATION  *UNUSUAL SHORTNESS OF BREATH  *UNUSUAL BRUISING OR BLEEDING  TENDERNESS IN MOUTH AND THROAT WITH OR WITHOUT PRESENCE OF ULCERS  *URINARY PROBLEMS  *BOWEL PROBLEMS  UNUSUAL RASH Items with * indicate a potential emergency and should be followed up as soon as possible.  Feel free to call the clinic should you have any questions or concerns. The clinic phone number is [phone].  Please show the [place] at check-in to the Emergency Department and triage nurse.

## 2020-07-30 NOTE — Progress Notes (Signed)
[place] OFFICE PROGRESS NOTE   Diagnosis: Colon cancer  INTERVAL HISTORY:   Jeff [name] returns as scheduled.  He completed cycle 3 irinotecan/Panitumumab 07/16/2020.  No significant nausea.  No mouth sores.  No diarrhea.  He continues to note a rash on his face, neck and upper back, pruritic in some areas.  He wonders if there is something stronger than the prescription hydrocortisone cream he is currently applying.  He denies fever, cough, shortness of breath.  Objective:  Vital signs in last 24 hours:  Blood pressure 138/80, pulse (!) 111, temperature 97.6 F (36.4 C), temperature source Tympanic, resp. rate 18, height $RemoveBe'5\' 10"'pCDiiEpDH$  (1.778 m), weight 241 lb 12.8 oz (109.7 kg).    HEENT: No thrush or ulcers. Resp: Lungs clear bilaterally. Cardio: Regular rate and rhythm. GI: Abdomen soft and nontender.  No hepatomegaly. Vascular: No leg edema.  Skin: Acne type rash face/scalp/posterior neck. Port-A-Cath without erythema.   Lab Results:  Lab Results  Component Value Date   WBC 9.2 07/30/2020   HGB 14.2 07/30/2020   HCT 45.7 07/30/2020   MCV 87.4 07/30/2020   PLT 205 07/30/2020   NEUTROABS 4.7 07/30/2020    Imaging:  No results found.  Medications: I have reviewed the patient's current medications.  Assessment/Plan: 1. 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 CTschest, abdomen, and pelvis on 12/11/2015-indeterminate tiny pulmonary nodules, multiple liver metastases, small nodes in the sigmoid mesocolon  3. Laparoscopic sigmoid colectomy, liver biopsy, Port-A-Cath placement 01/14/2016  4. Pathology sigmoid colon resection-colonic adenocarcinoma, 5.3 cm extending into pericolonic connective tissue, positive lymph-vascular invasion, positive perineural invasion, negative margins, metastatic carcinoma in9 of 28 lymph nodes  5. Pathology liver biopsy-metastatic  colorectal adenocarcinoma  6. MSI stable; mismatch repair protein normal  7. 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 Cycle 1 FOLFOX 02/02/2016  9. Cycle 2 FOLFOX 02/15/2016  10. Cycle 3 FOLFOX 02/29/2016  11. Cycle 4 FOLFOX 03/14/2016  12. Cycle 5 FOLFOX 03/28/2016  13. Cycle 6 FOLFOX 04/11/2016 (oxaliplatin held secondary to thrombocytopenia)  14. 04/21/2016 restaging CTs-difficulty evaluating liver lesions due to hepatic steatosis. Stable right adrenal nodule. No adenopathy or local recurrence near the rectosigmoid anastomotic site.  15. Cycle 7 FOLFOX 04/25/2016  16. MRI liver 05/02/2016-partial improvement in hepatic metastases  17. Cycle 8 FOLFOX 05/10/2016  18. Cycle 9 FOLFOX 05/23/2016 (oxaliplatin held due to thrombocytopenia)  19. Cycle 10 FOLFOX 06/06/2016 (oxaliplatin dose reduced due to thrombocytopenia)  20. Cycle 11 FOLFOX 06/27/2016 (oxaliplatin held due to neuropathy)  21. Cycle 12 FOLFOX 07/11/2016 (oxaliplatin held) 22. Initiation of maintenance Xeloda 7 days on/7 days off 07/27/2016 23. MRI liver 11/18/2016-decrease in hepatic metastatic disease. No new or progressive disease identified within the abdomen. 24. Continuation of Xeloda 7 days on/7 days off 25. MRI liver 04/27/2017-previous liver lesions not identified, no new lesions, no lymphadenopathy 26. Xeloda continued 7 days on/7 days off 27. MRI liver 12/04/2017 -no evidence of metastatic disease, hepatic steatosis 28. Xeloda continued 7 days on/7 days off 29. MRI liver 07/15/2018-no evidence of metastatic disease. Stable severe hepatic steatosis. 30. Xeloda continued 7 days on/7 days off 31. MRI liver 03/16/2019-hepatic steatosis, no liver mass, focal area of intrahepatic biliary dilatation in segments 2 and 3 of the left lobe-increased 32. Xeloda continued 7 days on/7 days off 33. MRI abdomen 08/19/2019-no findings to suggest liver metastases. Bilateral lung nodules measuring up to 2.8 cm, progressive  and  more conspicuous than on previous exam 34. CT chest 08/29/2019-multiple pulmonary metastases, new from 04/21/2016 35. Cycle 1 FOLFIRI/bevacizumab 09/09/2019 36. Cycle 2 FOLFIRI/bevacizumab 09/26/2019 37. Cycle 3 FOLFIRI/bevacizumab 10/10/2019 38. Cycle 4 FOLFIRI/bevacizumab 10/24/2019 39. Cycle 5 FOLFIRI/bevacizumab 11/07/2019 40. CT chest 11/14/2019-decreased size of lung nodules, no new lesions, hepatic steatosis 41. Cycle 6 FOLFIRI/bevacizumab 11/21/2019 42. Cycle 7 FOLFIRI/bevacizumab 12/05/2019 43. Cycle 8 FOLFIRI/bevacizumab 12/19/2019 44. Cycle 9 FOLFIRI/bevacizumab 01/02/2020 45. Cycle 10 FOLFIRI/bevacizumab 01/16/2020 46. CT chest 01/29/2020-stable bilateral pulmonary metastases. No new or progressive metastatic disease in the chest. 47. Cycle 11 FOLFIRI/bevacizumab 01/30/2020 48. Cycle 12 FOLFIRI/bevacizumab 02/19/2020 49. Cycle 13 FOLFIRI/bevacizumab 03/12/2020 50. Cycle 14 FOLFIRI/bevacizumab 04/02/2020 51. Cycle 15 FOLFIRI/bevacizumab 04/23/2020 52. Cycle 16 FOLFIRI/bevacizumab 05/21/2020 53. CT chest 06/09/2020-mild progression pulmonary metastasis. Some lesions have increased in size while others are similar. 54. Cycle 1 irinotecan/Panitumumab 06/18/2020 55. Cycle 2 irinotecan/Panitumumab 07/02/2020 56. Cycle 3 irinotecan/panitumumab 07/16/2020 57. Cycle 4 irinotecan/Panitumumab 07/30/2020  2. Rectal bleeding and constipation secondary to #1  3. History of peripheral vascular disease, status post left lower extremity vascular bypass surgery in April 2017  4. History of nephrolithiasis  5. History of Graves' disease treated with radioactive iodine  6. Anxiety/depression  7. Hypertension  8. Hospitalization 01/19/2016 with wound dehiscence status post secondary suture closure of abdominal wall  9. 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 Hyperglycemia 06/20/2016-diagnosed with diabetes, maintained on  insulin  11.Positive COVID test 12/13/2018  12. Hospitalized with seizure activity/DKA. Now on Keppra, insulin adjusted. 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 Babler appears stable.  He has completed 3 cycles of irinotecan/Panitumumab.  Plan to proceed with cycle 4 today as scheduled, restaging CTs after 5 or 6 cycles.  We reviewed the CBC from today.  Counts adequate to proceed with treatment.  The skin rash is related to Panitumumab.  He will continue doxycycline twice daily.  Prescription sent to his pharmacy for fluticasone.  He understands to apply sparingly and avoid the skin near the eyes and lips.  He will return for lab, follow-up, cycle 5 irinotecan/Panitumumab in 2 weeks.  He will contact the office in the interim with any problems.    Ned Card ANP/GNP-BC   07/30/2020  9:49 AM

## 2020-08-08 ENCOUNTER — Other Ambulatory Visit: Payer: Self-pay | Admitting: Oncology

## 2020-08-13 ENCOUNTER — Other Ambulatory Visit: Payer: Self-pay

## 2020-08-13 ENCOUNTER — Inpatient Hospital Stay: Payer: Medicaid Other

## 2020-08-13 ENCOUNTER — Inpatient Hospital Stay (HOSPITAL_BASED_OUTPATIENT_CLINIC_OR_DEPARTMENT_OTHER): Payer: Medicaid Other | Admitting: Oncology

## 2020-08-13 VITALS — BP 112/80 | HR 90 | Temp 98.6°F | Resp 16 | Ht 70.0 in | Wt 242.1 lb

## 2020-08-13 DIAGNOSIS — C187 Malignant neoplasm of sigmoid colon: Secondary | ICD-10-CM

## 2020-08-13 DIAGNOSIS — C787 Secondary malignant neoplasm of liver and intrahepatic bile duct: Secondary | ICD-10-CM

## 2020-08-13 DIAGNOSIS — Z95828 Presence of other vascular implants and grafts: Secondary | ICD-10-CM

## 2020-08-13 DIAGNOSIS — C189 Malignant neoplasm of colon, unspecified: Secondary | ICD-10-CM | POA: Diagnosis not present

## 2020-08-13 DIAGNOSIS — Z5112 Encounter for antineoplastic immunotherapy: Secondary | ICD-10-CM | POA: Diagnosis not present

## 2020-08-13 LAB — CBC WITH DIFFERENTIAL (CANCER CENTER ONLY)
Abs Immature Granulocytes: 0.05 10*3/uL (ref 0.00–0.07)
Basophils Absolute: 0 10*3/uL (ref 0.0–0.1)
Basophils Relative: 0 %
Eosinophils Absolute: 0.2 10*3/uL (ref 0.0–0.5)
Eosinophils Relative: 2 %
HCT: 44.5 % (ref 39.0–52.0)
Hemoglobin: 13.7 g/dL (ref 13.0–17.0)
Immature Granulocytes: 1 %
Lymphocytes Relative: 38 %
Lymphs Abs: 3.2 10*3/uL (ref 0.7–4.0)
MCH: 27.1 pg (ref 26.0–34.0)
MCHC: 30.8 g/dL (ref 30.0–36.0)
MCV: 88.1 fL (ref 80.0–100.0)
Monocytes Absolute: 0.7 10*3/uL (ref 0.1–1.0)
Monocytes Relative: 8 %
Neutro Abs: 4.4 10*3/uL (ref 1.7–7.7)
Neutrophils Relative %: 51 %
Platelet Count: 181 10*3/uL (ref 150–400)
RBC: 5.05 MIL/uL (ref 4.22–5.81)
RDW: 15.6 % — ABNORMAL HIGH (ref 11.5–15.5)
WBC Count: 8.5 10*3/uL (ref 4.0–10.5)
nRBC: 0 % (ref 0.0–0.2)

## 2020-08-13 LAB — CMP (CANCER CENTER ONLY)
ALT: 58 U/L — ABNORMAL HIGH (ref 0–44)
AST: 38 U/L (ref 15–41)
Albumin: 3.6 g/dL (ref 3.5–5.0)
Alkaline Phosphatase: 129 U/L — ABNORMAL HIGH (ref 38–126)
Anion gap: 16 — ABNORMAL HIGH (ref 5–15)
BUN: 8 mg/dL (ref 6–20)
CO2: 22 mmol/L (ref 22–32)
Calcium: 8.3 mg/dL — ABNORMAL LOW (ref 8.9–10.3)
Chloride: 103 mmol/L (ref 98–111)
Creatinine: 1.2 mg/dL (ref 0.61–1.24)
GFR, Estimated: >60 mL/min (ref >60)
Glucose, Bld: 364 mg/dL — ABNORMAL HIGH (ref 70–99)
Potassium: 3.7 mmol/L (ref 3.5–5.1)
Sodium: 141 mmol/L (ref 135–145)
Total Bilirubin: 0.4 mg/dL (ref 0.3–1.2)
Total Protein: 7.4 g/dL (ref 6.5–8.1)

## 2020-08-13 LAB — MAGNESIUM: Magnesium: 1.7 mg/dL (ref 1.7–2.4)

## 2020-08-13 MED ORDER — HEPARIN SOD (PORK) LOCK FLUSH 100 UNIT/ML IV SOLN
500.0000 [IU] | Freq: Once | INTRAVENOUS | Status: AC | PRN
  Administered 2020-08-13: 500 [IU]
  Filled 2020-08-13: qty 5

## 2020-08-13 MED ORDER — DEXAMETHASONE SODIUM PHOSPHATE 10 MG/ML IJ SOLN
INTRAMUSCULAR | Status: AC
  Filled 2020-08-13: qty 1

## 2020-08-13 MED ORDER — ATROPINE SULFATE 1 MG/ML IJ SOLN
INTRAMUSCULAR | Status: AC
  Filled 2020-08-13: qty 1

## 2020-08-13 MED ORDER — SODIUM CHLORIDE 0.9 % IV SOLN
Freq: Once | INTRAVENOUS | Status: AC
  Filled 2020-08-13: qty 250

## 2020-08-13 MED ORDER — ATROPINE SULFATE 1 MG/ML IJ SOLN
0.4000 mg | Freq: Once | INTRAMUSCULAR | Status: AC
  Administered 2020-08-13: 0.4 mg via INTRAVENOUS

## 2020-08-13 MED ORDER — SODIUM CHLORIDE 0.9 % IV SOLN
6.0000 mg/kg | Freq: Once | INTRAVENOUS | Status: AC
  Administered 2020-08-13: 600 mg via INTRAVENOUS
  Filled 2020-08-13: qty 20

## 2020-08-13 MED ORDER — PALONOSETRON HCL INJECTION 0.25 MG/5ML
INTRAVENOUS | Status: AC
  Filled 2020-08-13: qty 5

## 2020-08-13 MED ORDER — SODIUM CHLORIDE 0.9% FLUSH
10.0000 mL | INTRAVENOUS | Status: DC | PRN
  Filled 2020-08-13: qty 10

## 2020-08-13 MED ORDER — SODIUM CHLORIDE 0.9 % IV SOLN
180.0000 mg/m2 | Freq: Once | INTRAVENOUS | Status: AC
  Administered 2020-08-13: 420 mg via INTRAVENOUS
  Filled 2020-08-13: qty 15

## 2020-08-13 MED ORDER — ATROPINE SULFATE 0.4 MG/ML IJ SOLN
INTRAMUSCULAR | Status: AC
  Filled 2020-08-13: qty 1

## 2020-08-13 MED ORDER — PALONOSETRON HCL INJECTION 0.25 MG/5ML
0.2500 mg | Freq: Once | INTRAVENOUS | Status: AC
  Administered 2020-08-13: 0.25 mg via INTRAVENOUS

## 2020-08-13 MED ORDER — DEXAMETHASONE SODIUM PHOSPHATE 10 MG/ML IJ SOLN
5.0000 mg | Freq: Once | INTRAMUSCULAR | Status: AC
  Administered 2020-08-13: 5 mg via INTRAVENOUS

## 2020-08-13 MED ORDER — SODIUM CHLORIDE 0.9% FLUSH
10.0000 mL | INTRAVENOUS | Status: DC | PRN
  Administered 2020-08-13: 10 mL via INTRAVENOUS
  Filled 2020-08-13: qty 10

## 2020-08-13 MED ORDER — SODIUM CHLORIDE 0.9 % IV SOLN
150.0000 mg | Freq: Once | INTRAVENOUS | Status: AC
  Administered 2020-08-13: 150 mg via INTRAVENOUS
  Filled 2020-08-13: qty 150

## 2020-08-13 NOTE — Patient Instructions (Signed)
Implanted Port Insertion, Care After This sheet gives you information about how to care for yourself after your procedure. Your health care provider may also give you more specific instructions. If you have problems or questions, contact your health care provider. What can I expect after the procedure? After the procedure, it is common to have:  Discomfort at the port insertion site.  Bruising on the skin over the port. This should improve over 3-4 days. Follow these instructions at home: Port care  After your port is placed, you will get a manufacturer's information card. The card has information about your port. Keep this card with you at all times.  Take care of the port as told by your health care provider. Ask your health care provider if you or a family member can get training for taking care of the port at home. A home health care nurse may also take care of the port.  Make sure to remember what type of port you have. Incision care  Follow instructions from your health care provider about how to take care of your port insertion site. Make sure you: ? Wash your hands with soap and water before and after you change your bandage (dressing). If soap and water are not available, use hand sanitizer. ? Change your dressing as told by your health care provider. ? Leave stitches (sutures), skin glue, or adhesive strips in place. These skin closures may need to stay in place for 2 weeks or longer. If adhesive strip edges start to loosen and curl up, you may trim the loose edges. Do not remove adhesive strips completely unless your health care provider tells you to do that.  Check your port insertion site every day for signs of infection. Check for: ? Redness, swelling, or pain. ? Fluid or blood. ? Warmth. ? Pus or a bad smell.      Activity  Return to your normal activities as told by your health care provider. Ask your health care provider what activities are safe for you.  Do not  lift anything that is heavier than 10 lb (4.5 kg), or the limit that you are told, until your health care provider says that it is safe. General instructions  Take over-the-counter and prescription medicines only as told by your health care provider.  Do not take baths, swim, or use a hot tub until your health care provider approves. Ask your health care provider if you may take showers. You may only be allowed to take sponge baths.  Do not drive for 24 hours if you were given a sedative during your procedure.  Wear a medical alert bracelet in case of an emergency. This will tell any health care providers that you have a port.  Keep all follow-up visits as told by your health care provider. This is important. Contact a health care provider if:  You cannot flush your port with saline as directed, or you cannot draw blood from the port.  You have a fever or chills.  You have redness, swelling, or pain around your port insertion site.  You have fluid or blood coming from your port insertion site.  Your port insertion site feels warm to the touch.  You have pus or a bad smell coming from the port insertion site. Get help right away if:  You have chest pain or shortness of breath.  You have bleeding from your port that you cannot control. Summary  Take care of the port as told by your   health care provider. Keep the manufacturer's information card with you at all times.  Change your dressing as told by your health care provider.  Contact a health care provider if you have a fever or chills or if you have redness, swelling, or pain around your port insertion site.  Keep all follow-up visits as told by your health care provider. This information is not intended to replace advice given to you by your health care provider. Make sure you discuss any questions you have with your health care provider. Document Revised: 11/28/2017 Document Reviewed: 11/28/2017 Elsevier Patient Education   2021 [employer]  

## 2020-08-13 NOTE — Progress Notes (Signed)
[place] OFFICE PROGRESS NOTE   Diagnosis: Colon cancer  INTERVAL HISTORY:   Mr. Marlin completed another cycle of irinotecan/Panitumumab on 07/30/2020.  He reports having nausea and vomiting several times following chemotherapy.  No diarrhea or mouth sores.  Persistent rash over the face and chest.  Objective:  Vital signs in last 24 hours:  Blood pressure 112/80, pulse 90, temperature 98.6 F (37 C), temperature source Tympanic, resp. rate 16, height 5' 10" (1.778 m), weight 242 lb 1.6 oz (109.8 kg), SpO2 100 %.    HEENT: No thrush or ulcers Resp: Lungs clear bilaterally Cardio: Regular rate and rhythm GI: No hepatosplenomegaly Vascular: No leg edema  Skin: Mild acne type rash over the face, upper chest and upper back  Portacath/PICC-without erythema  Lab Results:  Lab Results  Component Value Date   WBC 8.5 08/13/2020   HGB 13.7 08/13/2020   HCT 44.5 08/13/2020   MCV 88.1 08/13/2020   PLT 181 08/13/2020   NEUTROABS 4.4 08/13/2020    CMP  Lab Results  Component Value Date   NA 141 08/13/2020   K 3.7 08/13/2020   CL 103 08/13/2020   CO2 22 08/13/2020   GLUCOSE 364 (H) 08/13/2020   BUN 8 08/13/2020   CREATININE 1.20 08/13/2020   CALCIUM 8.3 (L) 08/13/2020   PROT 7.4 08/13/2020   ALBUMIN 3.6 08/13/2020   AST 38 08/13/2020   ALT 58 (H) 08/13/2020   ALKPHOS 129 (H) 08/13/2020   BILITOT 0.4 08/13/2020   GFRNONAA >60 08/13/2020   GFRAA >60 01/30/2020    Lab Results  Component Value Date   CEA1 3.70 07/30/2020    Medications: I have reviewed the patient's current medications.   Assessment/Plan: 1. 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 CTschest, abdomen, and pelvis on 12/11/2015-indeterminate tiny pulmonary nodules, multiple liver metastases, small nodes in the sigmoid mesocolon  3. Laparoscopic sigmoid colectomy, liver biopsy, Port-A-Cath  placement 01/14/2016  4. Pathology sigmoid colon resection-colonic adenocarcinoma, 5.3 cm extending into pericolonic connective tissue, positive lymph-vascular invasion, positive perineural invasion, negative margins, metastatic carcinoma in9 of 28 lymph nodes  5. Pathology liver biopsy-metastatic colorectal adenocarcinoma  6. MSI stable; mismatch repair protein normal  7. 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 Cycle 1 FOLFOX 02/02/2016  9. Cycle 2 FOLFOX 02/15/2016  10. Cycle 3 FOLFOX 02/29/2016  11. Cycle 4 FOLFOX 03/14/2016  12. Cycle 5 FOLFOX 03/28/2016  13. Cycle 6 FOLFOX 04/11/2016 (oxaliplatin held secondary to thrombocytopenia)  14. 04/21/2016 restaging CTs-difficulty evaluating liver lesions due to hepatic steatosis. Stable right adrenal nodule. No adenopathy or local recurrence near the rectosigmoid anastomotic site.  15. Cycle 7 FOLFOX 04/25/2016  16. MRI liver 05/02/2016-partial improvement in hepatic metastases  17. Cycle 8 FOLFOX 05/10/2016  18. Cycle 9 FOLFOX 05/23/2016 (oxaliplatin held due to thrombocytopenia)  19. Cycle 10 FOLFOX 06/06/2016 (oxaliplatin dose reduced due to thrombocytopenia)  20. Cycle 11 FOLFOX 06/27/2016 (oxaliplatin held due to neuropathy)  21. Cycle 12 FOLFOX 07/11/2016 (oxaliplatin held) 22. Initiation of maintenance Xeloda 7 days on/7 days off 07/27/2016 23. MRI liver 11/18/2016-decrease in hepatic metastatic disease. No new or progressive disease identified within the abdomen. 24. Continuation of Xeloda 7 days on/7 days off 25. MRI liver 04/27/2017-previous liver lesions not identified, no new lesions, no lymphadenopathy 26. Xeloda continued 7 days on/7 days off 27. MRI liver 12/04/2017 -no evidence of metastatic disease, hepatic steatosis 28. Xeloda continued 7  days on/7 days off 29. MRI liver 07/15/2018-no evidence of metastatic disease. Stable severe hepatic steatosis. 30. Xeloda continued 7 days on/7 days off 31. MRI liver  03/16/2019-hepatic steatosis, no liver mass, focal area of intrahepatic biliary dilatation in segments 2 and 3 of the left lobe-increased 32. Xeloda continued 7 days on/7 days off 33. MRI abdomen 08/19/2019-no findings to suggest liver metastases. Bilateral lung nodules measuring up to 2.8 cm, progressive and more conspicuous than on previous exam 34. CT chest 08/29/2019-multiple pulmonary metastases, new from 04/21/2016 35. Cycle 1 FOLFIRI/bevacizumab 09/09/2019 36. Cycle 2 FOLFIRI/bevacizumab 09/26/2019 37. Cycle 3 FOLFIRI/bevacizumab 10/10/2019 38. Cycle 4 FOLFIRI/bevacizumab 10/24/2019 39. Cycle 5 FOLFIRI/bevacizumab 11/07/2019 40. CT chest 11/14/2019-decreased size of lung nodules, no new lesions, hepatic steatosis 41. Cycle 6 FOLFIRI/bevacizumab 11/21/2019 42. Cycle 7 FOLFIRI/bevacizumab 12/05/2019 43. Cycle 8 FOLFIRI/bevacizumab 12/19/2019 44. Cycle 9 FOLFIRI/bevacizumab 01/02/2020 45. Cycle 10 FOLFIRI/bevacizumab 01/16/2020 46. CT chest 01/29/2020-stable bilateral pulmonary metastases. No new or progressive metastatic disease in the chest. 47. Cycle 11 FOLFIRI/bevacizumab 01/30/2020 48. Cycle 12 FOLFIRI/bevacizumab 02/19/2020 49. Cycle 13 FOLFIRI/bevacizumab 03/12/2020 50. Cycle 14 FOLFIRI/bevacizumab 04/02/2020 51. Cycle 15 FOLFIRI/bevacizumab 04/23/2020 52. Cycle 16 FOLFIRI/bevacizumab 05/21/2020 53. CT chest 06/09/2020-mild progression pulmonary metastasis. Some lesions have increased in size while others are similar. 54. Cycle 1 irinotecan/Panitumumab 06/18/2020 55. Cycle 2 irinotecan/Panitumumab 07/02/2020 56. Cycle 3 irinotecan/panitumumab 07/16/2020 57. Cycle 4 irinotecan/Panitumumab 07/30/2020 58. Cycle 5 irinotecan/Panitumumab 08/13/2020, Emend added  2. Rectal bleeding and constipation secondary to #1  3. History of peripheral vascular disease, status post left lower extremity vascular bypass surgery in April 2017  4. History of nephrolithiasis  5. History of Graves' disease  treated with radioactive iodine  6. Anxiety/depression  7. Hypertension  8. Hospitalization 01/19/2016 with wound dehiscence status post secondary suture closure of abdominal wall  9. 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 Hyperglycemia 06/20/2016-diagnosed with diabetes, maintained on insulin  11.Positive COVID test 12/13/2018  12. Hospitalized with seizure activity/DKA. Now on Keppra, insulin adjusted. 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  He will complete another treatment with irinotecan/Panitumumab today.  The plan is to complete 6 treatments prior to a restaging CT.  We will add Emend for delayed nausea.  Mr. Ambs will return for an office visit and chemotherapy in 2 weeks  Betsy Coder, MD  08/13/2020  10:19 AM

## 2020-08-23 ENCOUNTER — Other Ambulatory Visit: Payer: Self-pay | Admitting: Oncology

## 2020-08-27 ENCOUNTER — Inpatient Hospital Stay (HOSPITAL_BASED_OUTPATIENT_CLINIC_OR_DEPARTMENT_OTHER): Payer: Medicaid Other | Admitting: Oncology

## 2020-08-27 ENCOUNTER — Inpatient Hospital Stay: Payer: Medicaid Other

## 2020-08-27 ENCOUNTER — Inpatient Hospital Stay: Payer: Medicaid Other | Attending: Oncology

## 2020-08-27 ENCOUNTER — Other Ambulatory Visit: Payer: Self-pay

## 2020-08-27 VITALS — BP 133/87 | HR 97 | Resp 18

## 2020-08-27 VITALS — BP 130/80 | HR 91 | Temp 97.7°F | Resp 18 | Ht 70.0 in | Wt 247.6 lb

## 2020-08-27 DIAGNOSIS — C787 Secondary malignant neoplasm of liver and intrahepatic bile duct: Secondary | ICD-10-CM | POA: Insufficient documentation

## 2020-08-27 DIAGNOSIS — C189 Malignant neoplasm of colon, unspecified: Secondary | ICD-10-CM

## 2020-08-27 DIAGNOSIS — Z5112 Encounter for antineoplastic immunotherapy: Secondary | ICD-10-CM | POA: Diagnosis present

## 2020-08-27 DIAGNOSIS — C187 Malignant neoplasm of sigmoid colon: Secondary | ICD-10-CM

## 2020-08-27 DIAGNOSIS — C7801 Secondary malignant neoplasm of right lung: Secondary | ICD-10-CM | POA: Insufficient documentation

## 2020-08-27 DIAGNOSIS — Z95828 Presence of other vascular implants and grafts: Secondary | ICD-10-CM

## 2020-08-27 DIAGNOSIS — Z5111 Encounter for antineoplastic chemotherapy: Secondary | ICD-10-CM | POA: Diagnosis present

## 2020-08-27 DIAGNOSIS — C7802 Secondary malignant neoplasm of left lung: Secondary | ICD-10-CM | POA: Insufficient documentation

## 2020-08-27 LAB — CMP (CANCER CENTER ONLY)
ALT: 55 U/L — ABNORMAL HIGH (ref 0–44)
AST: 42 U/L — ABNORMAL HIGH (ref 15–41)
Albumin: 4.2 g/dL (ref 3.5–5.0)
Alkaline Phosphatase: 98 U/L (ref 38–126)
Anion gap: 11 (ref 5–15)
BUN: 8 mg/dL (ref 6–20)
CO2: 24 mmol/L (ref 22–32)
Calcium: 9.2 mg/dL (ref 8.9–10.3)
Chloride: 102 mmol/L (ref 98–111)
Creatinine: 0.92 mg/dL (ref 0.61–1.24)
GFR, Estimated: >60 mL/min (ref >60)
Glucose, Bld: 315 mg/dL — ABNORMAL HIGH (ref 70–99)
Potassium: 3.8 mmol/L (ref 3.5–5.1)
Sodium: 137 mmol/L (ref 135–145)
Total Bilirubin: 0.5 mg/dL (ref 0.3–1.2)
Total Protein: 7.2 g/dL (ref 6.5–8.1)

## 2020-08-27 LAB — CBC WITH DIFFERENTIAL (CANCER CENTER ONLY)
Abs Immature Granulocytes: 0.04 10*3/uL (ref 0.00–0.07)
Basophils Absolute: 0 10*3/uL (ref 0.0–0.1)
Basophils Relative: 0 %
Eosinophils Absolute: 0.3 10*3/uL (ref 0.0–0.5)
Eosinophils Relative: 4 %
HCT: 44.1 % (ref 39.0–52.0)
Hemoglobin: 13.6 g/dL (ref 13.0–17.0)
Immature Granulocytes: 1 %
Lymphocytes Relative: 39 %
Lymphs Abs: 3 10*3/uL (ref 0.7–4.0)
MCH: 27.3 pg (ref 26.0–34.0)
MCHC: 30.8 g/dL (ref 30.0–36.0)
MCV: 88.6 fL (ref 80.0–100.0)
Monocytes Absolute: 0.6 10*3/uL (ref 0.1–1.0)
Monocytes Relative: 8 %
Neutro Abs: 3.7 10*3/uL (ref 1.7–7.7)
Neutrophils Relative %: 48 %
Platelet Count: 202 10*3/uL (ref 150–400)
RBC: 4.98 MIL/uL (ref 4.22–5.81)
RDW: 15.9 % — ABNORMAL HIGH (ref 11.5–15.5)
WBC Count: 7.6 10*3/uL (ref 4.0–10.5)
nRBC: 0 % (ref 0.0–0.2)

## 2020-08-27 LAB — CEA (ACCESS): CEA (CHCC): 4.58 ng/mL (ref 0.00–5.00)

## 2020-08-27 LAB — CEA (IN HOUSE-CHCC): CEA (CHCC-In House): 4.4 ng/mL (ref 0.00–5.00)

## 2020-08-27 LAB — MAGNESIUM: Magnesium: 1.7 mg/dL (ref 1.7–2.4)

## 2020-08-27 MED ORDER — ATROPINE SULFATE 1 MG/ML IJ SOLN
0.4000 mg | Freq: Once | INTRAMUSCULAR | Status: AC
  Administered 2020-08-27: 0.4 mg via INTRAVENOUS
  Filled 2020-08-27: qty 1

## 2020-08-27 MED ORDER — PALONOSETRON HCL INJECTION 0.25 MG/5ML
0.2500 mg | Freq: Once | INTRAVENOUS | Status: AC
  Administered 2020-08-27: 0.25 mg via INTRAVENOUS
  Filled 2020-08-27: qty 5

## 2020-08-27 MED ORDER — SODIUM CHLORIDE 0.9% FLUSH
10.0000 mL | INTRAVENOUS | Status: DC | PRN
  Administered 2020-08-27: 10 mL via INTRAVENOUS
  Filled 2020-08-27: qty 10

## 2020-08-27 MED ORDER — OLANZAPINE 5 MG PO TABS: ORAL_TABLET | ORAL | 1 refills | Status: DC

## 2020-08-27 MED ORDER — SODIUM CHLORIDE 0.9 % IV SOLN
180.0000 mg/m2 | Freq: Once | INTRAVENOUS | Status: AC
  Administered 2020-08-27: 420 mg via INTRAVENOUS
  Filled 2020-08-27: qty 15

## 2020-08-27 MED ORDER — SODIUM CHLORIDE 0.9 % IV SOLN
Freq: Once | INTRAVENOUS | Status: AC
  Filled 2020-08-27: qty 250

## 2020-08-27 MED ORDER — HEPARIN SOD (PORK) LOCK FLUSH 100 UNIT/ML IV SOLN
500.0000 [IU] | Freq: Once | INTRAVENOUS | Status: DC | PRN
  Filled 2020-08-27: qty 5

## 2020-08-27 MED ORDER — DEXAMETHASONE SODIUM PHOSPHATE 10 MG/ML IJ SOLN
5.0000 mg | Freq: Once | INTRAMUSCULAR | Status: AC
  Administered 2020-08-27: 5 mg via INTRAVENOUS
  Filled 2020-08-27: qty 1

## 2020-08-27 MED ORDER — SODIUM CHLORIDE 0.9 % IV SOLN
6.0000 mg/kg | Freq: Once | INTRAVENOUS | Status: AC
  Administered 2020-08-27: 700 mg via INTRAVENOUS
  Filled 2020-08-27: qty 15

## 2020-08-27 MED ORDER — SODIUM CHLORIDE 0.9 % IV SOLN
150.0000 mg | Freq: Once | INTRAVENOUS | Status: AC
  Administered 2020-08-27: 150 mg via INTRAVENOUS
  Filled 2020-08-27: qty 5

## 2020-08-27 NOTE — Progress Notes (Signed)
[place] OFFICE PROGRESS NOTE   Diagnosis: Colon cancer  INTERVAL HISTORY:   Mr. Renovato completed another cycle of irinotecan/Panitumumab on 08/13/2020.  He had less nausea following this cycle.  He took Zyprexa prophylaxis.  No mouth sores or diarrhea.  Stable rash over the face.  Objective:  Vital signs in last 24 hours:  Blood pressure 130/80, pulse 91, temperature 97.7 F (36.5 C), temperature source Tympanic, resp. rate 18, height _0  (1.778 m), weight 247 lb 9.6 oz (112.3 kg), SpO2 100 %.    HEENT: No thrush or ulcers Resp: Lungs clear bilaterally Cardio: Regular rate and rhythm GI: No hepatosplenomegaly, nontender Vascular: No leg edema  Skin: Dry acne type rash over the face  Portacath/PICC-without erythema  Lab Results:  Lab Results  Component Value Date   WBC 8.5 08/13/2020   HGB 13.7 08/13/2020   HCT 44.5 08/13/2020   MCV 88.1 08/13/2020   PLT 181 08/13/2020   NEUTROABS 4.4 08/13/2020    CMP  Lab Results  Component Value Date   NA 141 08/13/2020   K 3.7 08/13/2020   CL 103 08/13/2020   CO2 22 08/13/2020   GLUCOSE 364 (H) 08/13/2020   BUN 8 08/13/2020   CREATININE 1.20 08/13/2020   CALCIUM 8.3 (L) 08/13/2020   PROT 7.4 08/13/2020   ALBUMIN 3.6 08/13/2020   AST 38 08/13/2020   ALT 58 (H) 08/13/2020   ALKPHOS 129 (H) 08/13/2020   BILITOT 0.4 08/13/2020   GFRNONAA >60 08/13/2020   GFRAA >60 01/30/2020    Lab Results  Component Value Date   CEA1 3.70 07/30/2020    Medications: I have reviewed the patient's current medications.   Assessment/Plan: 1. 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 CTschest, abdomen, and pelvis on 12/11/2015-indeterminate tiny pulmonary nodules, multiple liver metastases, small nodes in the sigmoid mesocolon  3. Laparoscopic sigmoid colectomy, liver biopsy, Port-A-Cath placement 01/14/2016   4. Pathology sigmoid colon resection-colonic adenocarcinoma, 5.3 cm extending into pericolonic connective tissue, positive lymph-vascular invasion, positive perineural invasion, negative margins, metastatic carcinoma in9 of 28 lymph nodes  5. Pathology liver biopsy-metastatic colorectal adenocarcinoma  6. MSI stable; mismatch repair protein normal  7. 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 Cycle 1 FOLFOX 02/02/2016  9. Cycle 2 FOLFOX 02/15/2016  10. Cycle 3 FOLFOX 02/29/2016  11. Cycle 4 FOLFOX 03/14/2016  12. Cycle 5 FOLFOX 03/28/2016  13. Cycle 6 FOLFOX 04/11/2016 (oxaliplatin held secondary to thrombocytopenia)  14. 04/21/2016 restaging CTs-difficulty evaluating liver lesions due to hepatic steatosis. Stable right adrenal nodule. No adenopathy or local recurrence near the rectosigmoid anastomotic site.  15. Cycle 7 FOLFOX 04/25/2016  16. MRI liver 05/02/2016-partial improvement in hepatic metastases  17. Cycle 8 FOLFOX 05/10/2016  18. Cycle 9 FOLFOX 05/23/2016 (oxaliplatin held due to thrombocytopenia)  19. Cycle 10 FOLFOX 06/06/2016 (oxaliplatin dose reduced due to thrombocytopenia)  20. Cycle 11 FOLFOX 06/27/2016 (oxaliplatin held due to neuropathy)  21. Cycle 12 FOLFOX 07/11/2016 (oxaliplatin held) 22. Initiation of maintenance Xeloda 7 days on/7 days off 07/27/2016 23. MRI liver 11/18/2016-decrease in hepatic metastatic disease. No new or progressive disease identified within the abdomen. 24. Continuation of Xeloda 7 days on/7 days off 25. MRI liver 04/27/2017-previous liver lesions not identified, no new lesions, no lymphadenopathy 26. Xeloda continued 7 days on/7 days off 27. MRI liver 12/04/2017 -no evidence of metastatic disease, hepatic steatosis 28. Xeloda continued 7 days on/7 days off  29. MRI liver 07/15/2018-no evidence of metastatic disease. Stable severe hepatic steatosis. 30. Xeloda continued 7 days on/7 days off 31. MRI liver 03/16/2019-hepatic  steatosis, no liver mass, focal area of intrahepatic biliary dilatation in segments 2 and 3 of the left lobe-increased 32. Xeloda continued 7 days on/7 days off 33. MRI abdomen 08/19/2019-no findings to suggest liver metastases. Bilateral lung nodules measuring up to 2.8 cm, progressive and more conspicuous than on previous exam 34. CT chest 08/29/2019-multiple pulmonary metastases, new from 04/21/2016 35. Cycle 1 FOLFIRI/bevacizumab 09/09/2019 36. Cycle 2 FOLFIRI/bevacizumab 09/26/2019 37. Cycle 3 FOLFIRI/bevacizumab 10/10/2019 38. Cycle 4 FOLFIRI/bevacizumab 10/24/2019 39. Cycle 5 FOLFIRI/bevacizumab 11/07/2019 40. CT chest 11/14/2019-decreased size of lung nodules, no new lesions, hepatic steatosis 41. Cycle 6 FOLFIRI/bevacizumab 11/21/2019 42. Cycle 7 FOLFIRI/bevacizumab 12/05/2019 43. Cycle 8 FOLFIRI/bevacizumab 12/19/2019 44. Cycle 9 FOLFIRI/bevacizumab 01/02/2020 45. Cycle 10 FOLFIRI/bevacizumab 01/16/2020 46. CT chest 01/29/2020-stable bilateral pulmonary metastases. No new or progressive metastatic disease in the chest. 47. Cycle 11 FOLFIRI/bevacizumab 01/30/2020 48. Cycle 12 FOLFIRI/bevacizumab 02/19/2020 49. Cycle 13 FOLFIRI/bevacizumab 03/12/2020 50. Cycle 14 FOLFIRI/bevacizumab 04/02/2020 51. Cycle 15 FOLFIRI/bevacizumab 04/23/2020 52. Cycle 16 FOLFIRI/bevacizumab 05/21/2020 53. CT chest 06/09/2020-mild progression pulmonary metastasis. Some lesions have increased in size while others are similar. 54. Cycle 1 irinotecan/Panitumumab 06/18/2020 55. Cycle 2 irinotecan/Panitumumab 07/02/2020 56. Cycle 3 irinotecan/panitumumab 07/16/2020 57. Cycle 4 irinotecan/Panitumumab 07/30/2020 58. Cycle 5 irinotecan/Panitumumab 08/13/2020, Emend added 59. Cycle 6 irinotecan/Panitumumab 08/27/2020  2. Rectal bleeding and constipation secondary to #1  3. History of peripheral vascular disease, status post left lower extremity vascular bypass surgery in April 2017  4. History of nephrolithiasis  5.  History of Graves' disease treated with radioactive iodine  6. Anxiety/depression  7. Hypertension  8. Hospitalization 01/19/2016 with wound dehiscence status post secondary suture closure of abdominal wall  9. 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 Hyperglycemia 06/20/2016-diagnosed with diabetes, maintained on insulin  11.Positive COVID test 12/13/2018  12. Hospitalized with seizure activity/DKA. Now on Keppra, insulin adjusted. 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  He will complete another treatment with irinotecan/Panitumumab today.  He will undergo a restaging CT prior to an office visit in 2 weeks.  Betsy Coder, MD  08/27/2020  10:03 AM

## 2020-08-27 NOTE — Patient Instructions (Signed)
[facility] Discharge Instructions for Patients Receiving Chemotherapy  Today you received the following chemotherapy agents Vectibix and Irinotecan  To help prevent nausea and vomiting after your treatment, we encourage you to take your nausea medication   If you develop nausea and vomiting that is not controlled by your nausea medication, call the clinic.   BELOW ARE SYMPTOMS THAT SHOULD BE REPORTED IMMEDIATELY:  *FEVER GREATER THAN 100.5 F  *CHILLS WITH OR WITHOUT FEVER  NAUSEA AND VOMITING THAT IS NOT CONTROLLED WITH YOUR NAUSEA MEDICATION  *UNUSUAL SHORTNESS OF BREATH  *UNUSUAL BRUISING OR BLEEDING  TENDERNESS IN MOUTH AND THROAT WITH OR WITHOUT PRESENCE OF ULCERS  *URINARY PROBLEMS  *BOWEL PROBLEMS  UNUSUAL RASH Items with * indicate a potential emergency and should be followed up as soon as possible.  Feel free to call the clinic should you have any questions or concerns at The clinic phone number is [phone].  Please show the [facility] at check-in to the Emergency Department and triage nurse.

## 2020-08-27 NOTE — Progress Notes (Signed)
Jeff [name] presents today for D1C6 Irinotecan and Vectibix. Pt denies any new changes or symptoms since last treatment. Lab results and vitals have been reviewed and are stable and within parameters for treatment. Patient has been assessed by Dr. Benay Spice who has approved proceeding with treatment today as planned.  Infusions tolerated without incident or complaint. VSS upon completion of treatment. Port flushed and deaccessed per protocol, see MAR and IV flowsheet for details. Discharged in satisfactory condition with follow up instructions.

## 2020-08-27 NOTE — Patient Instructions (Signed)

## 2020-09-06 ENCOUNTER — Other Ambulatory Visit: Payer: Self-pay | Admitting: Oncology

## 2020-09-07 ENCOUNTER — Ambulatory Visit (INDEPENDENT_AMBULATORY_CARE_PROVIDER_SITE_OTHER): Payer: Medicaid Other | Admitting: Podiatry

## 2020-09-07 DIAGNOSIS — Z5329 Procedure and treatment not carried out because of patient's decision for other reasons: Secondary | ICD-10-CM

## 2020-09-07 NOTE — Progress Notes (Signed)
No show for appt. 

## 2020-09-08 ENCOUNTER — Ambulatory Visit (HOSPITAL_BASED_OUTPATIENT_CLINIC_OR_DEPARTMENT_OTHER)
Admission: RE | Admit: 2020-09-08 | Discharge: 2020-09-08 | Disposition: A | Discharge status: Home / Self Care | Payer: Medicaid Other | Source: Ambulatory Visit | Attending: Oncology | Admitting: Oncology

## 2020-09-08 ENCOUNTER — Other Ambulatory Visit: Payer: Self-pay

## 2020-09-08 DIAGNOSIS — C187 Malignant neoplasm of sigmoid colon: Secondary | ICD-10-CM | POA: Insufficient documentation

## 2020-09-10 ENCOUNTER — Other Ambulatory Visit: Payer: Self-pay

## 2020-09-10 ENCOUNTER — Inpatient Hospital Stay: Payer: Medicaid Other

## 2020-09-10 ENCOUNTER — Inpatient Hospital Stay (HOSPITAL_BASED_OUTPATIENT_CLINIC_OR_DEPARTMENT_OTHER): Payer: Medicaid Other | Admitting: Oncology

## 2020-09-10 VITALS — BP 122/80 | HR 92 | Temp 96.6°F | Resp 18

## 2020-09-10 VITALS — BP 154/80 | HR 102 | Temp 97.8°F | Resp 20 | Ht 70.0 in | Wt 242.0 lb

## 2020-09-10 DIAGNOSIS — C189 Malignant neoplasm of colon, unspecified: Secondary | ICD-10-CM

## 2020-09-10 DIAGNOSIS — Z5112 Encounter for antineoplastic immunotherapy: Secondary | ICD-10-CM | POA: Diagnosis not present

## 2020-09-10 DIAGNOSIS — Z95828 Presence of other vascular implants and grafts: Secondary | ICD-10-CM

## 2020-09-10 DIAGNOSIS — C187 Malignant neoplasm of sigmoid colon: Secondary | ICD-10-CM | POA: Diagnosis not present

## 2020-09-10 LAB — CBC WITH DIFFERENTIAL (CANCER CENTER ONLY)
Abs Immature Granulocytes: 0.05 10*3/uL (ref 0.00–0.07)
Basophils Absolute: 0 10*3/uL (ref 0.0–0.1)
Basophils Relative: 0 %
Eosinophils Absolute: 0.3 10*3/uL (ref 0.0–0.5)
Eosinophils Relative: 4 %
HCT: 43.7 % (ref 39.0–52.0)
Hemoglobin: 13.5 g/dL (ref 13.0–17.0)
Immature Granulocytes: 1 %
Lymphocytes Relative: 36 %
Lymphs Abs: 2.8 10*3/uL (ref 0.7–4.0)
MCH: 27.1 pg (ref 26.0–34.0)
MCHC: 30.9 g/dL (ref 30.0–36.0)
MCV: 87.6 fL (ref 80.0–100.0)
Monocytes Absolute: 0.7 10*3/uL (ref 0.1–1.0)
Monocytes Relative: 9 %
Neutro Abs: 4 10*3/uL (ref 1.7–7.7)
Neutrophils Relative %: 50 %
Platelet Count: 176 10*3/uL (ref 150–400)
RBC: 4.99 MIL/uL (ref 4.22–5.81)
RDW: 15.9 % — ABNORMAL HIGH (ref 11.5–15.5)
WBC Count: 7.8 10*3/uL (ref 4.0–10.5)
nRBC: 0 % (ref 0.0–0.2)

## 2020-09-10 LAB — CMP (CANCER CENTER ONLY)
ALT: 50 U/L — ABNORMAL HIGH (ref 0–44)
AST: 40 U/L (ref 15–41)
Albumin: 4.2 g/dL (ref 3.5–5.0)
Alkaline Phosphatase: 102 U/L (ref 38–126)
Anion gap: 11 (ref 5–15)
BUN: 9 mg/dL (ref 6–20)
CO2: 25 mmol/L (ref 22–32)
Calcium: 9 mg/dL (ref 8.9–10.3)
Chloride: 101 mmol/L (ref 98–111)
Creatinine: 0.91 mg/dL (ref 0.61–1.24)
GFR, Estimated: >60 mL/min (ref >60)
Glucose, Bld: 295 mg/dL — ABNORMAL HIGH (ref 70–99)
Potassium: 3.7 mmol/L (ref 3.5–5.1)
Sodium: 137 mmol/L (ref 135–145)
Total Bilirubin: 0.5 mg/dL (ref 0.3–1.2)
Total Protein: 7.1 g/dL (ref 6.5–8.1)

## 2020-09-10 LAB — MAGNESIUM: Magnesium: 1.6 mg/dL — ABNORMAL LOW (ref 1.7–2.4)

## 2020-09-10 LAB — CEA (IN HOUSE-CHCC): CEA (CHCC-In House): 3.19 ng/mL (ref 0.00–5.00)

## 2020-09-10 LAB — CEA (ACCESS): CEA (CHCC): 3.99 ng/mL (ref 0.00–5.00)

## 2020-09-10 MED ORDER — ATROPINE SULFATE 1 MG/ML IJ SOLN
0.4000 mg | Freq: Once | INTRAMUSCULAR | Status: AC
  Administered 2020-09-10: 0.4 mg via INTRAVENOUS
  Filled 2020-09-10: qty 1

## 2020-09-10 MED ORDER — PALONOSETRON HCL INJECTION 0.25 MG/5ML
0.2500 mg | Freq: Once | INTRAVENOUS | Status: AC
  Administered 2020-09-10: 0.25 mg via INTRAVENOUS
  Filled 2020-09-10: qty 5

## 2020-09-10 MED ORDER — DEXAMETHASONE SODIUM PHOSPHATE 10 MG/ML IJ SOLN
5.0000 mg | Freq: Once | INTRAMUSCULAR | Status: AC
  Administered 2020-09-10: 5 mg via INTRAVENOUS
  Filled 2020-09-10: qty 1

## 2020-09-10 MED ORDER — HEPARIN SOD (PORK) LOCK FLUSH 100 UNIT/ML IV SOLN
500.0000 [IU] | Freq: Once | INTRAVENOUS | Status: AC | PRN
  Administered 2020-09-10: 500 [IU]
  Filled 2020-09-10: qty 5

## 2020-09-10 MED ORDER — SODIUM CHLORIDE 0.9% FLUSH
10.0000 mL | INTRAVENOUS | Status: DC | PRN
  Administered 2020-09-10: 10 mL
  Filled 2020-09-10: qty 10

## 2020-09-10 MED ORDER — SODIUM CHLORIDE 0.9 % IV SOLN
180.0000 mg/m2 | Freq: Once | INTRAVENOUS | Status: AC
  Administered 2020-09-10: 420 mg via INTRAVENOUS
  Filled 2020-09-10: qty 21

## 2020-09-10 MED ORDER — SODIUM CHLORIDE 0.9 % IV SOLN
150.0000 mg | Freq: Once | INTRAVENOUS | Status: AC
  Administered 2020-09-10: 150 mg via INTRAVENOUS
  Filled 2020-09-10: qty 5

## 2020-09-10 MED ORDER — SODIUM CHLORIDE 0.9 % IV SOLN
Freq: Once | INTRAVENOUS | Status: AC
Start: 2020-09-10 — End: 2020-09-10
  Filled 2020-09-10: qty 250

## 2020-09-10 MED ORDER — SODIUM CHLORIDE 0.9 % IV SOLN
6.0000 mg/kg | Freq: Once | INTRAVENOUS | Status: AC
  Administered 2020-09-10: 700 mg via INTRAVENOUS
  Filled 2020-09-10: qty 20

## 2020-09-10 MED ORDER — SODIUM CHLORIDE 0.9% FLUSH
10.0000 mL | INTRAVENOUS | Status: DC | PRN
  Administered 2020-09-10: 10 mL via INTRAVENOUS
  Filled 2020-09-10: qty 10

## 2020-09-10 NOTE — Progress Notes (Signed)
[facility] OFFICE PROGRESS NOTE   Diagnosis: Colon cancer  INTERVAL HISTORY:   Mr Vonbehren completed another cycle of irinotecan/panitumumab on 08/27/2020.  No nausea.  Mild intermittent diarrhea.  Stable skin rash.  No new complaint.  Objective:  Vital signs in last 24 hours:  Blood pressure (!) 154/80, pulse (!) 102, temperature 97.8 F (36.6 C), temperature source Tympanic, resp. rate 20, height $RemoveBe'5\' 10"'yakiLYzQM$  (1.778 m), weight 242 lb (109.8 kg), SpO2 99 %.    HEENT: No thrush or ulcers Resp: Lungs clear bilaterally Cardio: Regular rate and rhythm GI: No hepatosplenomegaly Vascular: No leg edema  Skin: Acne type rash over the face, chest, and upper back  Portacath/PICC-without erythema  Lab Results:  Lab Results  Component Value Date   WBC 7.8 09/10/2020   HGB 13.5 09/10/2020   HCT 43.7 09/10/2020   MCV 87.6 09/10/2020   PLT 176 09/10/2020   NEUTROABS 4.0 09/10/2020    CMP  Lab Results  Component Value Date   NA 137 08/27/2020   K 3.8 08/27/2020   CL 102 08/27/2020   CO2 24 08/27/2020   GLUCOSE 315 (H) 08/27/2020   BUN 8 08/27/2020   CREATININE 0.92 08/27/2020   CALCIUM 9.2 08/27/2020   PROT 7.2 08/27/2020   ALBUMIN 4.2 08/27/2020   AST 42 (H) 08/27/2020   ALT 55 (H) 08/27/2020   ALKPHOS 98 08/27/2020   BILITOT 0.5 08/27/2020   GFRNONAA >60 08/27/2020   GFRAA >60 01/30/2020    Lab Results  Component Value Date   CEA1 4.40 08/27/2020    Imaging:  CT CHEST WO CONTRAST  Result Date: 09/08/2020 CLINICAL DATA:  Colorectal cancer.  Restaging. EXAM: CT CHEST WITHOUT CONTRAST TECHNIQUE: Multidetector CT imaging of the chest was performed following the standard protocol without IV contrast. COMPARISON:  06/09/2020 FINDINGS: Cardiovascular: The heart size is normal. No substantial pericardial effusion. Atherosclerotic calcification is noted in the wall of the thoracic aorta. Ascending thoracic aorta measures 3.9 cm diameter. Right Port-A-Cath tip is  in the mid SVC. Mediastinum/Nodes: No mediastinal lymphadenopathy. No evidence for gross hilar lymphadenopathy although assessment is limited by the lack of intravenous contrast on today's study. The esophagus has normal imaging features. There is no axillary lymphadenopathy. Lungs/Pleura: Measurements on the prior study do not persist. Lesions on the previous exam are remeasured today in a similar fashion to measurements obtained on today's exam. 2.6 cm right middle lobe nodule on 79/4 today was 3.2 cm previously. 8 mm right parahilar nodule on 63/4 was 11 mm previously. 2.9 cm posterior left upper lobe perifissural nodule seen on 84/4 was 3.8 cm previously. 1.4 cm posterior right lower lobe nodule on 97/4 was 1.8 cm previously. 2.2 cm nodule in the medial right base (116/4) was 2.7 cm previously. No new suspicious pulmonary nodule or mass. No focal airspace consolidation. No pleural effusion. Upper Abdomen: The liver shows diffusely decreased attenuation suggesting fat deposition. Soft tissue fullness described along the posterior right liver on the previous study is not evident today. 11 mm right adrenal nodule is unchanged and was characterized as adenoma on MRI of 12/04/2017. Gallbladder is nondistended with tiny stones in the neck. Tiny stone in the upper pole left kidney has been incompletely visualized. Musculoskeletal: No worrisome lytic or sclerotic osseous abnormality. IMPRESSION: 1. Overall generalized decrease in pulmonary nodules bilaterally. No new or progressive pulmonary nodule or mass on today's study. 2. Hepatic steatosis. 3. Cholelithiasis. 4. Stable 11 mm right adrenal adenoma. 5. Left nephrolithiasis. 6.  Aortic Atherosclerosis (ICD10-I70.0). Electronically Signed   By: Misty Stanley M.D.   On: 09/08/2020 13:16    Medications: I have reviewed the patient's current medications.   Assessment/Plan: 1. 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 CTschest, abdomen, and pelvis on 12/11/2015-indeterminate tiny pulmonary nodules, multiple liver metastases, small nodes in the sigmoid mesocolon  3. Laparoscopic sigmoid colectomy, liver biopsy, Port-A-Cath placement 01/14/2016  4. Pathology sigmoid colon resection-colonic adenocarcinoma, 5.3 cm extending into pericolonic connective tissue, positive lymph-vascular invasion, positive perineural invasion, negative margins, metastatic carcinoma in9 of 28 lymph nodes  5. Pathology liver biopsy-metastatic colorectal adenocarcinoma  6. MSI stable; mismatch repair protein normal  7. 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 Cycle 1 FOLFOX 02/02/2016  9. Cycle 2 FOLFOX 02/15/2016  10. Cycle 3 FOLFOX 02/29/2016  11. Cycle 4 FOLFOX 03/14/2016  12. Cycle 5 FOLFOX 03/28/2016  13. Cycle 6 FOLFOX 04/11/2016 (oxaliplatin held secondary to thrombocytopenia)  14. 04/21/2016 restaging CTs-difficulty evaluating liver lesions due to hepatic steatosis. Stable right adrenal nodule. No adenopathy or local recurrence near the rectosigmoid anastomotic site.  15. Cycle 7 FOLFOX 04/25/2016  16. MRI liver 05/02/2016-partial improvement in hepatic metastases  17. Cycle 8 FOLFOX 05/10/2016  18. Cycle 9 FOLFOX 05/23/2016 (oxaliplatin held due to thrombocytopenia)  19. Cycle 10 FOLFOX 06/06/2016 (oxaliplatin dose reduced due to thrombocytopenia)  20. Cycle 11 FOLFOX 06/27/2016 (oxaliplatin held due to neuropathy)  21. Cycle 12 FOLFOX 07/11/2016 (oxaliplatin held) 22. Initiation of maintenance Xeloda 7 days on/7 days off 07/27/2016 23. MRI liver 11/18/2016-decrease in hepatic metastatic disease. No new or progressive disease identified within the abdomen. 24. Continuation of Xeloda 7 days on/7 days off 25. MRI liver 04/27/2017-previous liver lesions not identified, no new lesions, no lymphadenopathy 26. Xeloda continued 7 days on/7 days off 27. MRI liver 12/04/2017  -no evidence of metastatic disease, hepatic steatosis 28. Xeloda continued 7 days on/7 days off 29. MRI liver 07/15/2018-no evidence of metastatic disease. Stable severe hepatic steatosis. 30. Xeloda continued 7 days on/7 days off 31. MRI liver 03/16/2019-hepatic steatosis, no liver mass, focal area of intrahepatic biliary dilatation in segments 2 and 3 of the left lobe-increased 32. Xeloda continued 7 days on/7 days off 33. MRI abdomen 08/19/2019-no findings to suggest liver metastases. Bilateral lung nodules measuring up to 2.8 cm, progressive and more conspicuous than on previous exam 34. CT chest 08/29/2019-multiple pulmonary metastases, new from 04/21/2016 35. Cycle 1 FOLFIRI/bevacizumab 09/09/2019 36. Cycle 2 FOLFIRI/bevacizumab 09/26/2019 37. Cycle 3 FOLFIRI/bevacizumab 10/10/2019 38. Cycle 4 FOLFIRI/bevacizumab 10/24/2019 39. Cycle 5 FOLFIRI/bevacizumab 11/07/2019 40. CT chest 11/14/2019-decreased size of lung nodules, no new lesions, hepatic steatosis 41. Cycle 6 FOLFIRI/bevacizumab 11/21/2019 42. Cycle 7 FOLFIRI/bevacizumab 12/05/2019 43. Cycle 8 FOLFIRI/bevacizumab 12/19/2019 44. Cycle 9 FOLFIRI/bevacizumab 01/02/2020 45. Cycle 10 FOLFIRI/bevacizumab 01/16/2020 46. CT chest 01/29/2020-stable bilateral pulmonary metastases. No new or progressive metastatic disease in the chest. 47. Cycle 11 FOLFIRI/bevacizumab 01/30/2020 48. Cycle 12 FOLFIRI/bevacizumab 02/19/2020 49. Cycle 13 FOLFIRI/bevacizumab 03/12/2020 50. Cycle 14 FOLFIRI/bevacizumab 04/02/2020 51. Cycle 15 FOLFIRI/bevacizumab 04/23/2020 52. Cycle 16 FOLFIRI/bevacizumab 05/21/2020 53. CT chest 06/09/2020-mild progression pulmonary metastasis. Some lesions have increased in size while others are similar. 54. Cycle 1 irinotecan/Panitumumab 06/18/2020 55. Cycle 2 irinotecan/Panitumumab 07/02/2020 56. Cycle 3 irinotecan/panitumumab 07/16/2020 57. Cycle 4 irinotecan/Panitumumab 07/30/2020 58. Cycle 5 irinotecan/Panitumumab 08/13/2020, Emend  added 59. Cycle 6 irinotecan/Panitumumab 08/27/2020 60. CT chest 09/08/2020-decreased size of pulmonary nodules, no progressive disease 61. Cycle 7 irinotecan/panitumumab 09/02/2020  2. Rectal bleeding and constipation secondary to #1  3. History of peripheral vascular disease, status post left lower extremity vascular bypass surgery in April 2017  4. History of nephrolithiasis  5. History of Graves' disease treated with radioactive iodine  6. Anxiety/depression  7. Hypertension  8. Hospitalization 01/19/2016 with wound dehiscence status post secondary suture closure of abdominal wall  9. 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 Hyperglycemia 06/20/2016-diagnosed with diabetes, maintained on insulin  11.Positive COVID test 12/13/2018  12. Hospitalized with seizure activity/DKA. Now on Keppra, insulin adjusted. 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  The restaging chest CT confirms a response to the irinotecan/panitumumab.  I reviewed the CT images with Mr. Belair and his wife.  The plan is to continue irinotecan/panitumumab.  He will complete another treatment today.  He will continue to use hydrocortisone cream on the skin rash.  He continues doxycycline.  Mr. Radloff will return for an office visit and chemotherapy in 2 weeks.  Betsy Coder, MD  09/10/2020  9:19 AM

## 2020-09-10 NOTE — Progress Notes (Signed)
Pt here for D1C7 of vectibix and irinotecan every 14 days.  Last treatment 08/27/20.  Magnesium 1.6.  Pt is taking his magnesium twice a day at home.   Pt tolerated treatment well today.  Stable during and after treatment.  Vital signs stable prior to discharge.  AVS reviewed.  Discharged in stable condition ambulatory.

## 2020-09-10 NOTE — Patient Instructions (Signed)
[name]  Discharge Instructions: Thank you for choosing [facility] to provide your oncology and hematology care.   If you have a lab appointment with the [facility], please go directly to the [facility] and check in at the registration area.   Wear comfortable clothing and clothing appropriate for easy access to any Portacath or PICC line.   We strive to give you quality time with your provider. You may need to reschedule your appointment if you arrive late (15 or more minutes).  Arriving late affects you and other patients whose appointments are after yours.  Also, if you miss three or more appointments without notifying the office, you may be dismissed from the clinic at the provider's discretion.      For prescription refill requests, have your pharmacy contact our office and allow 72 hours for refills to be completed.    Today you received the following chemotherapy and/or immunotherapy agents vectibix and irinotecan. Return as scheduled.  Please call the clinic if you have any questions or concerns.       To help prevent nausea and vomiting after your treatment, we encourage you to take your nausea medication as directed.  BELOW ARE SYMPTOMS THAT SHOULD BE REPORTED IMMEDIATELY: . *FEVER GREATER THAN 100.4 F (38 C) OR HIGHER . *CHILLS OR SWEATING . *NAUSEA AND VOMITING THAT IS NOT CONTROLLED WITH YOUR NAUSEA MEDICATION . *UNUSUAL SHORTNESS OF BREATH . *UNUSUAL BRUISING OR BLEEDING . *URINARY PROBLEMS (pain or burning when urinating, or frequent urination) . *BOWEL PROBLEMS (unusual diarrhea, constipation, pain near the anus) . TENDERNESS IN MOUTH AND THROAT WITH OR WITHOUT PRESENCE OF ULCERS (sore throat, sores in mouth, or a toothache) . UNUSUAL RASH, SWELLING OR PAIN  . UNUSUAL VAGINAL DISCHARGE OR ITCHING   Items with * indicate a potential emergency and should be followed up as soon as possible or go to the Emergency Department  if any problems should occur.  Please show the CHEMOTHERAPY ALERT CARD or IMMUNOTHERAPY ALERT CARD at check-in to the Emergency Department and triage nurse.  Should you have questions after your visit or need to cancel or reschedule your appointment, please contact [facility]  Dept: [phone]  and follow the prompts.  Office hours are 8:00 a.m. to 4:30 p.m. Monday - Friday. Please note that voicemails left after 4:00 p.m. may not be returned until the following business day.  We are closed weekends and major holidays. You have access to a nurse at all times for urgent questions. Please call the main number to the clinic Dept: [phone] and follow the prompts.   For any non-urgent questions, you may also contact your provider using MyChart. We now offer e-Visits for anyone 54 and older to request care online for non-urgent symptoms. For details visit [email or web address].   Also download the MyChart app! Go to the app store, search "MyChart", open the app, select [facility], and log in with your MyChart username and password.  Due to Covid, a mask is required upon entering the hospital/clinic. If you do not have a mask, one will be given to you upon arrival. For doctor visits, patients may have 1 support person aged 54 or older with them. For treatment visits, patients cannot have anyone with them due to current Covid guidelines and our immunocompromised population.

## 2020-09-15 ENCOUNTER — Other Ambulatory Visit: Payer: Self-pay | Admitting: Nurse Practitioner

## 2020-09-15 DIAGNOSIS — C189 Malignant neoplasm of colon, unspecified: Secondary | ICD-10-CM

## 2020-09-20 ENCOUNTER — Other Ambulatory Visit: Payer: Self-pay | Admitting: Oncology

## 2020-09-24 ENCOUNTER — Inpatient Hospital Stay: Payer: Medicaid Other

## 2020-09-24 ENCOUNTER — Other Ambulatory Visit: Payer: Self-pay

## 2020-09-24 ENCOUNTER — Inpatient Hospital Stay (HOSPITAL_BASED_OUTPATIENT_CLINIC_OR_DEPARTMENT_OTHER): Payer: Medicaid Other | Admitting: Oncology

## 2020-09-24 ENCOUNTER — Inpatient Hospital Stay: Payer: Medicaid Other | Attending: Oncology

## 2020-09-24 VITALS — BP 144/84 | HR 99 | Temp 98.1°F | Resp 20 | Ht 70.0 in | Wt 245.4 lb

## 2020-09-24 DIAGNOSIS — C187 Malignant neoplasm of sigmoid colon: Secondary | ICD-10-CM | POA: Insufficient documentation

## 2020-09-24 DIAGNOSIS — C7802 Secondary malignant neoplasm of left lung: Secondary | ICD-10-CM | POA: Insufficient documentation

## 2020-09-24 DIAGNOSIS — C787 Secondary malignant neoplasm of liver and intrahepatic bile duct: Secondary | ICD-10-CM | POA: Insufficient documentation

## 2020-09-24 DIAGNOSIS — Z5112 Encounter for antineoplastic immunotherapy: Secondary | ICD-10-CM | POA: Diagnosis not present

## 2020-09-24 DIAGNOSIS — Z5111 Encounter for antineoplastic chemotherapy: Secondary | ICD-10-CM | POA: Insufficient documentation

## 2020-09-24 DIAGNOSIS — C7801 Secondary malignant neoplasm of right lung: Secondary | ICD-10-CM | POA: Insufficient documentation

## 2020-09-24 LAB — CBC WITH DIFFERENTIAL (CANCER CENTER ONLY)
Abs Immature Granulocytes: 0.04 10*3/uL (ref 0.00–0.07)
Basophils Absolute: 0 10*3/uL (ref 0.0–0.1)
Basophils Relative: 0 %
Eosinophils Absolute: 0.2 10*3/uL (ref 0.0–0.5)
Eosinophils Relative: 3 %
HCT: 43.8 % (ref 39.0–52.0)
Hemoglobin: 13.3 g/dL (ref 13.0–17.0)
Immature Granulocytes: 1 %
Lymphocytes Relative: 37 %
Lymphs Abs: 2.5 10*3/uL (ref 0.7–4.0)
MCH: 27 pg (ref 26.0–34.0)
MCHC: 30.4 g/dL (ref 30.0–36.0)
MCV: 88.8 fL (ref 80.0–100.0)
Monocytes Absolute: 0.6 10*3/uL (ref 0.1–1.0)
Monocytes Relative: 8 %
Neutro Abs: 3.4 10*3/uL (ref 1.7–7.7)
Neutrophils Relative %: 51 %
Platelet Count: 177 10*3/uL (ref 150–400)
RBC: 4.93 MIL/uL (ref 4.22–5.81)
RDW: 16.3 % — ABNORMAL HIGH (ref 11.5–15.5)
WBC Count: 6.7 10*3/uL (ref 4.0–10.5)
nRBC: 0 % (ref 0.0–0.2)

## 2020-09-24 LAB — CMP (CANCER CENTER ONLY)
ALT: 49 U/L — ABNORMAL HIGH (ref 0–44)
AST: 44 U/L — ABNORMAL HIGH (ref 15–41)
Albumin: 3.9 g/dL (ref 3.5–5.0)
Alkaline Phosphatase: 96 U/L (ref 38–126)
Anion gap: 11 (ref 5–15)
BUN: 5 mg/dL — ABNORMAL LOW (ref 6–20)
CO2: 25 mmol/L (ref 22–32)
Calcium: 8.4 mg/dL — ABNORMAL LOW (ref 8.9–10.3)
Chloride: 104 mmol/L (ref 98–111)
Creatinine: 0.98 mg/dL (ref 0.61–1.24)
GFR, Estimated: >60 mL/min (ref >60)
Glucose, Bld: 280 mg/dL — ABNORMAL HIGH (ref 70–99)
Potassium: 3.5 mmol/L (ref 3.5–5.1)
Sodium: 140 mmol/L (ref 135–145)
Total Bilirubin: 0.5 mg/dL (ref 0.3–1.2)
Total Protein: 6.8 g/dL (ref 6.5–8.1)

## 2020-09-24 LAB — CEA (ACCESS): CEA (CHCC): 4.57 ng/mL (ref 0.00–5.00)

## 2020-09-24 LAB — MAGNESIUM: Magnesium: 1.5 mg/dL — ABNORMAL LOW (ref 1.7–2.4)

## 2020-09-24 MED ORDER — SODIUM CHLORIDE 0.9 % IV SOLN
180.0000 mg/m2 | Freq: Once | INTRAVENOUS | Status: AC
  Administered 2020-09-24: 420 mg via INTRAVENOUS
  Filled 2020-09-24: qty 15

## 2020-09-24 MED ORDER — HEPARIN SOD (PORK) LOCK FLUSH 100 UNIT/ML IV SOLN
500.0000 [IU] | Freq: Once | INTRAVENOUS | Status: AC | PRN
  Administered 2020-09-24: 500 [IU]
  Filled 2020-09-24: qty 5

## 2020-09-24 MED ORDER — ATROPINE SULFATE 1 MG/ML IJ SOLN
0.4000 mg | Freq: Once | INTRAMUSCULAR | Status: AC
  Administered 2020-09-24: 0.4 mg via INTRAVENOUS
  Filled 2020-09-24: qty 1

## 2020-09-24 MED ORDER — SODIUM CHLORIDE 0.9 % IV SOLN
6.0000 mg/kg | Freq: Once | INTRAVENOUS | Status: AC
  Administered 2020-09-24: 700 mg via INTRAVENOUS
  Filled 2020-09-24: qty 20

## 2020-09-24 MED ORDER — DEXAMETHASONE SODIUM PHOSPHATE 10 MG/ML IJ SOLN
5.0000 mg | Freq: Once | INTRAMUSCULAR | Status: AC
  Administered 2020-09-24: 5 mg via INTRAVENOUS
  Filled 2020-09-24: qty 1

## 2020-09-24 MED ORDER — SODIUM CHLORIDE 0.9 % IV SOLN
Freq: Once | INTRAVENOUS | Status: AC
Start: 2020-09-24 — End: 2020-09-24
  Filled 2020-09-24: qty 250

## 2020-09-24 MED ORDER — PALONOSETRON HCL INJECTION 0.25 MG/5ML
0.2500 mg | Freq: Once | INTRAVENOUS | Status: AC
  Administered 2020-09-24: 0.25 mg via INTRAVENOUS
  Filled 2020-09-24: qty 5

## 2020-09-24 MED ORDER — MAGNESIUM SULFATE 2 GM/50ML IV SOLN
2.0000 g | Freq: Once | INTRAVENOUS | Status: AC
  Administered 2020-09-24: 2 g via INTRAVENOUS
  Filled 2020-09-24: qty 50

## 2020-09-24 MED ORDER — SODIUM CHLORIDE 0.9 % IV SOLN
150.0000 mg | Freq: Once | INTRAVENOUS | Status: AC
  Administered 2020-09-24: 150 mg via INTRAVENOUS
  Filled 2020-09-24: qty 150

## 2020-09-24 MED ORDER — SODIUM CHLORIDE 0.9% FLUSH
10.0000 mL | INTRAVENOUS | Status: DC | PRN
  Administered 2020-09-24: 10 mL
  Filled 2020-09-24: qty 10

## 2020-09-24 NOTE — Patient Instructions (Signed)

## 2020-09-24 NOTE — Patient Instructions (Signed)
[facility]  Discharge Instructions: Thank you for choosing [facility] to provide your oncology and hematology care.   If you have a lab appointment with the [facility], please go directly to the [facility] and check in at the registration area.   Wear comfortable clothing and clothing appropriate for easy access to any Portacath or PICC line.   We strive to give you quality time with your provider. You may need to reschedule your appointment if you arrive late (15 or more minutes).  Arriving late affects you and other patients whose appointments are after yours.  Also, if you miss three or more appointments without notifying the office, you may be dismissed from the clinic at the provider's discretion.      For prescription refill requests, have your pharmacy contact our office and allow 72 hours for refills to be completed.    Today you received the following chemotherapy and/or immunotherapy agents vectibix and irinotecan. Return as scheduled.  Please call the clinic if you have any questions or concerns.       To help prevent nausea and vomiting after your treatment, we encourage you to take your nausea medication as directed.  BELOW ARE SYMPTOMS THAT SHOULD BE REPORTED IMMEDIATELY: . *FEVER GREATER THAN 100.4 F (38 C) OR HIGHER . *CHILLS OR SWEATING . *NAUSEA AND VOMITING THAT IS NOT CONTROLLED WITH YOUR NAUSEA MEDICATION . *UNUSUAL SHORTNESS OF BREATH . *UNUSUAL BRUISING OR BLEEDING . *URINARY PROBLEMS (pain or burning when urinating, or frequent urination) . *BOWEL PROBLEMS (unusual diarrhea, constipation, pain near the anus) . TENDERNESS IN MOUTH AND THROAT WITH OR WITHOUT PRESENCE OF ULCERS (sore throat, sores in mouth, or a toothache) . UNUSUAL RASH, SWELLING OR PAIN  . UNUSUAL VAGINAL DISCHARGE OR ITCHING   Items with * indicate a potential emergency and should be followed up as soon as possible or go to the Emergency Department  if any problems should occur.  Please show the CHEMOTHERAPY ALERT CARD or IMMUNOTHERAPY ALERT CARD at check-in to the Emergency Department and triage nurse.  Should you have questions after your visit or need to cancel or reschedule your appointment, please contact [facility]  Dept: [phone]  and follow the prompts.  Office hours are 8:00 a.m. to 4:30 p.m. Monday - Friday. Please note that voicemails left after 4:00 p.m. may not be returned until the following business day.  We are closed weekends and major holidays. You have access to a nurse at all times for urgent questions. Please call the main number to the clinic Dept: [phone] and follow the prompts.   For any non-urgent questions, you may also contact your provider using MyChart. We now offer e-Visits for anyone 35 and older to request care online for non-urgent symptoms. For details visit [email or web address].   Also download the MyChart app! Go to the app store, search "MyChart", open the app, select [facility], and log in with your MyChart username and password.  Due to Covid, a mask is required upon entering the hospital/clinic. If you do not have a mask, one will be given to you upon arrival. For doctor visits, patients may have 1 support person aged 10 or older with them. For treatment visits, patients cannot have anyone with them due to current Covid guidelines and our immunocompromised population.   Irinotecan injection What is this medicine? IRINOTECAN (ir in oh TEE kan ) is a chemotherapy drug. It is used to  treat colon and rectal cancer. This medicine may be used for other purposes; ask your health care provider or pharmacist if you have questions. COMMON BRAND NAME(S): Camptosar What should I tell my health care provider before I take this medicine? They need to know if you have any of these conditions:  dehydration  diarrhea  infection (especially a virus infection such as  chickenpox, cold sores, or herpes)  liver disease  low blood counts, like low white cell, platelet, or red cell counts  low levels of calcium, magnesium, or potassium in the blood  recent or ongoing radiation therapy  an unusual or allergic reaction to irinotecan, other medicines, foods, dyes, or preservatives  pregnant or trying to get pregnant  breast-feeding How should I use this medicine? This drug is given as an infusion into a vein. It is administered in a hospital or clinic by a specially trained health care professional. Talk to your pediatrician regarding the use of this medicine in children. Special care may be needed. Overdosage: If you think you have taken too much of this medicine contact a poison control center or emergency room at once. NOTE: This medicine is only for you. Do not share this medicine with others. What if I miss a dose? It is important not to miss your dose. Call your doctor or health care professional if you are unable to keep an appointment. What may interact with this medicine? Do not take this medicine with any of the following medications:  cobicistat  itraconazole This medicine may interact with the following medications:  antiviral medicines for HIV or AIDS  certain antibiotics like rifampin or rifabutin  certain medicines for fungal infections like ketoconazole, posaconazole, and voriconazole  certain medicines for seizures like carbamazepine, phenobarbital, phenotoin  clarithromycin  gemfibrozil  nefazodone  St. John's Wort This list may not describe all possible interactions. Give your health care provider a list of all the medicines, herbs, non-prescription drugs, or dietary supplements you use. Also tell them if you smoke, drink alcohol, or use illegal drugs. Some items may interact with your medicine. What should I watch for while using this medicine? Your condition will be monitored carefully while you are receiving this  medicine. You will need important blood work done while you are taking this medicine. This drug may make you feel generally unwell. This is not uncommon, as chemotherapy can affect healthy cells as well as cancer cells. Report any side effects. Continue your course of treatment even though you feel ill unless your doctor tells you to stop. In some cases, you may be given additional medicines to help with side effects. Follow all directions for their use. You may get drowsy or dizzy. Do not drive, use machinery, or do anything that needs mental alertness until you know how this medicine affects you. Do not stand or sit up quickly, especially if you are an older patient. This reduces the risk of dizzy or fainting spells. Call your health care professional for advice if you get a fever, chills, or sore throat, or other symptoms of a cold or flu. Do not treat yourself. This medicine decreases your body's ability to fight infections. Try to avoid being around people who are sick. Avoid taking products that contain aspirin, acetaminophen, ibuprofen, naproxen, or ketoprofen unless instructed by your doctor. These medicines may hide a fever. This medicine may increase your risk to bruise or bleed. Call your doctor or health care professional if you notice any unusual bleeding. Be careful brushing  and flossing your teeth or using a toothpick because you may get an infection or bleed more easily. If you have any dental work done, tell your dentist you are receiving this medicine. Do not become pregnant while taking this medicine or for 6 months after stopping it. Women should inform their health care professional if they wish to become pregnant or think they might be pregnant. Men should not father a child while taking this medicine and for 3 months after stopping it. There is potential for serious side effects to an unborn child. Talk to your health care professional for more information. Do not breast-feed an  infant while taking this medicine or for 7 days after stopping it. This medicine has caused ovarian failure in some women. This medicine may make it more difficult to get pregnant. Talk to your health care professional if you are concerned about your fertility. This medicine has caused decreased sperm counts in some men. This may make it more difficult to father a child. Talk to your health care professional if you are concerned about your fertility. What side effects may I notice from receiving this medicine? Side effects that you should report to your doctor or health care professional as soon as possible:  allergic reactions like skin rash, itching or hives, swelling of the face, lips, or tongue  chest pain  diarrhea  flushing, runny nose, sweating during infusion  low blood counts - this medicine may decrease the number of white blood cells, red blood cells and platelets. You may be at increased risk for infections and bleeding.  nausea, vomiting  pain, swelling, warmth in the leg  signs of decreased platelets or bleeding - bruising, pinpoint red spots on the skin, black, tarry stools, blood in the urine  signs of infection - fever or chills, cough, sore throat, pain or difficulty passing urine  signs of decreased red blood cells - unusually weak or tired, fainting spells, lightheadedness Side effects that usually do not require medical attention (report to your doctor or health care professional if they continue or are bothersome):  constipation  hair loss  headache  loss of appetite  mouth sores  stomach pain This list may not describe all possible side effects. Call your doctor for medical advice about side effects. You may report side effects to FDA at 1-800-FDA-1088. Where should I keep my medicine? This drug is given in a hospital or clinic and will not be stored at home. NOTE: This sheet is a summary. It may not cover all possible information. If you have questions  about this medicine, talk to your doctor, pharmacist, or health care provider.  2021 Elsevier/Gold Standard (2019-04-02 17:46:13)  Irinotecan injection What is this medicine? IRINOTECAN (ir in oh TEE kan ) is a chemotherapy drug. It is used to treat colon and rectal cancer. This medicine may be used for other purposes; ask your health care provider or pharmacist if you have questions. COMMON BRAND NAME(S): Camptosar What should I tell my health care provider before I take this medicine? They need to know if you have any of these conditions:  dehydration  diarrhea  infection (especially a virus infection such as chickenpox, cold sores, or herpes)  liver disease  low blood counts, like low white cell, platelet, or red cell counts  low levels of calcium, magnesium, or potassium in the blood  recent or ongoing radiation therapy  an unusual or allergic reaction to irinotecan, other medicines, foods, dyes, or preservatives  pregnant or  trying to get pregnant  breast-feeding How should I use this medicine? This drug is given as an infusion into a vein. It is administered in a hospital or clinic by a specially trained health care professional. Talk to your pediatrician regarding the use of this medicine in children. Special care may be needed. Overdosage: If you think you have taken too much of this medicine contact a poison control center or emergency room at once. NOTE: This medicine is only for you. Do not share this medicine with others. What if I miss a dose? It is important not to miss your dose. Call your doctor or health care professional if you are unable to keep an appointment. What may interact with this medicine? Do not take this medicine with any of the following medications:  cobicistat  itraconazole This medicine may interact with the following medications:  antiviral medicines for HIV or AIDS  certain antibiotics like rifampin or rifabutin  certain medicines  for fungal infections like ketoconazole, posaconazole, and voriconazole  certain medicines for seizures like carbamazepine, phenobarbital, phenotoin  clarithromycin  gemfibrozil  nefazodone  St. John's Wort This list may not describe all possible interactions. Give your health care provider a list of all the medicines, herbs, non-prescription drugs, or dietary supplements you use. Also tell them if you smoke, drink alcohol, or use illegal drugs. Some items may interact with your medicine. What should I watch for while using this medicine? Your condition will be monitored carefully while you are receiving this medicine. You will need important blood work done while you are taking this medicine. This drug may make you feel generally unwell. This is not uncommon, as chemotherapy can affect healthy cells as well as cancer cells. Report any side effects. Continue your course of treatment even though you feel ill unless your doctor tells you to stop. In some cases, you may be given additional medicines to help with side effects. Follow all directions for their use. You may get drowsy or dizzy. Do not drive, use machinery, or do anything that needs mental alertness until you know how this medicine affects you. Do not stand or sit up quickly, especially if you are an older patient. This reduces the risk of dizzy or fainting spells. Call your health care professional for advice if you get a fever, chills, or sore throat, or other symptoms of a cold or flu. Do not treat yourself. This medicine decreases your body's ability to fight infections. Try to avoid being around people who are sick. Avoid taking products that contain aspirin, acetaminophen, ibuprofen, naproxen, or ketoprofen unless instructed by your doctor. These medicines may hide a fever. This medicine may increase your risk to bruise or bleed. Call your doctor or health care professional if you notice any unusual bleeding. Be careful brushing  and flossing your teeth or using a toothpick because you may get an infection or bleed more easily. If you have any dental work done, tell your dentist you are receiving this medicine. Do not become pregnant while taking this medicine or for 6 months after stopping it. Women should inform their health care professional if they wish to become pregnant or think they might be pregnant. Men should not father a child while taking this medicine and for 3 months after stopping it. There is potential for serious side effects to an unborn child. Talk to your health care professional for more information. Do not breast-feed an infant while taking this medicine or for 7 days after stopping  it. This medicine has caused ovarian failure in some women. This medicine may make it more difficult to get pregnant. Talk to your health care professional if you are concerned about your fertility. This medicine has caused decreased sperm counts in some men. This may make it more difficult to father a child. Talk to your health care professional if you are concerned about your fertility. What side effects may I notice from receiving this medicine? Side effects that you should report to your doctor or health care professional as soon as possible:  allergic reactions like skin rash, itching or hives, swelling of the face, lips, or tongue  chest pain  diarrhea  flushing, runny nose, sweating during infusion  low blood counts - this medicine may decrease the number of white blood cells, red blood cells and platelets. You may be at increased risk for infections and bleeding.  nausea, vomiting  pain, swelling, warmth in the leg  signs of decreased platelets or bleeding - bruising, pinpoint red spots on the skin, black, tarry stools, blood in the urine  signs of infection - fever or chills, cough, sore throat, pain or difficulty passing urine  signs of decreased red blood cells - unusually weak or tired, fainting spells,  lightheadedness Side effects that usually do not require medical attention (report to your doctor or health care professional if they continue or are bothersome):  constipation  hair loss  headache  loss of appetite  mouth sores  stomach pain This list may not describe all possible side effects. Call your doctor for medical advice about side effects. You may report side effects to FDA at 1-800-FDA-1088. Where should I keep my medicine? This drug is given in a hospital or clinic and will not be stored at home. NOTE: This sheet is a summary. It may not cover all possible information. If you have questions about this medicine, talk to your doctor, pharmacist, or health care provider.  2021 Elsevier/Gold Standard (2019-04-02 17:46:13)  Magnesium Sulfate injection What is this medicine? MAGNESIUM SULFATE (mag NEE zee um SUL fate) is an electrolyte injection commonly used to treat low magnesium levels in your blood. It is also used to prevent or control seizures in women with preeclampsia or eclampsia. This medicine may be used for other purposes; ask your health care provider or pharmacist if you have questions. What should I tell my health care provider before I take this medicine? They need to know if you have any of these conditions:  heart disease  history of irregular heart beat  kidney disease  an unusual or allergic reaction to magnesium sulfate, medicines, foods, dyes, or preservatives  pregnant or trying to get pregnant  breast-feeding How should I use this medicine? This medicine is for infusion into a vein. It is given by a health care professional in a hospital or clinic setting. Talk to your pediatrician regarding the use of this medicine in children. While this drug may be prescribed for selected conditions, precautions do apply. Overdosage: If you think you have taken too much of this medicine contact a poison control center or emergency room at once. NOTE: This  medicine is only for you. Do not share this medicine with others. What if I miss a dose? This does not apply. What may interact with this medicine? This medicine may interact with the following medications:  certain medicines for anxiety or sleep  certain medicines for seizures like phenobarbital  digoxin  medicines that relax muscles for surgery  narcotic  medicines for pain This list may not describe all possible interactions. Give your health care provider a list of all the medicines, herbs, non-prescription drugs, or dietary supplements you use. Also tell them if you smoke, drink alcohol, or use illegal drugs. Some items may interact with your medicine. What should I watch for while using this medicine? Your condition will be monitored carefully while you are receiving this medicine. You may need blood work done while you are receiving this medicine. What side effects may I notice from receiving this medicine? Side effects that you should report to your doctor or health care professional as soon as possible:  allergic reactions like skin rash, itching or hives, swelling of the face, lips, or tongue  facial flushing  muscle weakness  signs and symptoms of low blood pressure like dizziness; feeling faint or lightheaded, falls; unusually weak or tired  signs and symptoms of a dangerous change in heartbeat or heart rhythm like chest pain; dizziness; fast or irregular heartbeat; palpitations; breathing problems  sweating This list may not describe all possible side effects. Call your doctor for medical advice about side effects. You may report side effects to FDA at 1-800-FDA-1088. Where should I keep my medicine? This drug is given in a hospital or clinic and will not be stored at home. NOTE: This sheet is a summary. It may not cover all possible information. If you have questions about this medicine, talk to your doctor, pharmacist, or health care provider.  2021 Elsevier/Gold  Standard (2015-11-18 12:31:42)

## 2020-09-24 NOTE — Progress Notes (Signed)
[facility] OFFICE PROGRESS NOTE   Diagnosis: Colon cancer  INTERVAL HISTORY:   Jeff [name] completed another cycle of irinotecan/panitumumab on 09/10/2020.  No nausea/vomiting or diarrhea.  Stable skin rash.  He has developed dryness of the hands with areas of paronychia.  He is using fluticasone and hydrocortisone cream.  Objective:  Vital signs in last 24 hours:  Blood pressure (!) 144/84, pulse 99, temperature 98.1 F (36.7 C), temperature source Oral, resp. rate 20, height $RemoveBe'5\' 10"'FaFRdTudU$  (1.778 m), weight 245 lb 6.4 oz (111.3 kg), SpO2 100 %.    HEENT: No thrush or ulcers Resp: Lungs clear bilaterally Cardio: Regular rate and rhythm GI: No hepatosplenomegaly, nontender Vascular: No leg edema  Skin: Acne type rash over the face and trunk, superficial dry desquamation at the palms with a few areas of paronychia  Portacath/PICC-without erythema  Lab Results:  Lab Results  Component Value Date   WBC 6.7 09/24/2020   HGB 13.3 09/24/2020   HCT 43.8 09/24/2020   MCV 88.8 09/24/2020   PLT 177 09/24/2020   NEUTROABS 3.4 09/24/2020    CMP  Lab Results  Component Value Date   NA 140 09/24/2020   K 3.5 09/24/2020   CL 104 09/24/2020   CO2 25 09/24/2020   GLUCOSE 280 (H) 09/24/2020   BUN 5 (L) 09/24/2020   CREATININE 0.98 09/24/2020   CALCIUM 8.4 (L) 09/24/2020   PROT 6.8 09/24/2020   ALBUMIN 3.9 09/24/2020   AST 44 (H) 09/24/2020   ALT 49 (H) 09/24/2020   ALKPHOS 96 09/24/2020   BILITOT 0.5 09/24/2020   GFRNONAA >60 09/24/2020   GFRAA >60 01/30/2020    Lab Results  Component Value Date   CEA1 3.19 09/10/2020     Medications: I have reviewed the patient's current medications.   Assessment/Plan: 1. 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 CTschest, abdomen, and pelvis on 12/11/2015-indeterminate tiny pulmonary nodules, multiple liver metastases, small  nodes in the sigmoid mesocolon  3. Laparoscopic sigmoid colectomy, liver biopsy, Port-A-Cath placement 01/14/2016  4. Pathology sigmoid colon resection-colonic adenocarcinoma, 5.3 cm extending into pericolonic connective tissue, positive lymph-vascular invasion, positive perineural invasion, negative margins, metastatic carcinoma in9 of 28 lymph nodes  5. Pathology liver biopsy-metastatic colorectal adenocarcinoma  6. MSI stable; mismatch repair protein normal  7. 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 Cycle 1 FOLFOX 02/02/2016  9. Cycle 2 FOLFOX 02/15/2016  10. Cycle 3 FOLFOX 02/29/2016  11. Cycle 4 FOLFOX 03/14/2016  12. Cycle 5 FOLFOX 03/28/2016  13. Cycle 6 FOLFOX 04/11/2016 (oxaliplatin held secondary to thrombocytopenia)  14. 04/21/2016 restaging CTs-difficulty evaluating liver lesions due to hepatic steatosis. Stable right adrenal nodule. No adenopathy or local recurrence near the rectosigmoid anastomotic site.  15. Cycle 7 FOLFOX 04/25/2016  16. MRI liver 05/02/2016-partial improvement in hepatic metastases  17. Cycle 8 FOLFOX 05/10/2016  18. Cycle 9 FOLFOX 05/23/2016 (oxaliplatin held due to thrombocytopenia)  19. Cycle 10 FOLFOX 06/06/2016 (oxaliplatin dose reduced due to thrombocytopenia)  20. Cycle 11 FOLFOX 06/27/2016 (oxaliplatin held due to neuropathy)  21. Cycle 12 FOLFOX 07/11/2016 (oxaliplatin held) 22. Initiation of maintenance Xeloda 7 days on/7 days off 07/27/2016 23. MRI liver 11/18/2016-decrease in hepatic metastatic disease. No new or progressive disease identified within the abdomen. 24. Continuation of Xeloda 7 days on/7 days off 25. MRI liver 04/27/2017-previous liver lesions not identified, no new lesions, no lymphadenopathy 26. Xeloda continued 7 days on/7 days  off 27. MRI liver 12/04/2017 -no evidence of metastatic disease, hepatic steatosis 28. Xeloda continued 7 days on/7 days off 29. MRI liver 07/15/2018-no evidence of metastatic disease.  Stable severe hepatic steatosis. 30. Xeloda continued 7 days on/7 days off 31. MRI liver 03/16/2019-hepatic steatosis, no liver mass, focal area of intrahepatic biliary dilatation in segments 2 and 3 of the left lobe-increased 32. Xeloda continued 7 days on/7 days off 33. MRI abdomen 08/19/2019-no findings to suggest liver metastases. Bilateral lung nodules measuring up to 2.8 cm, progressive and more conspicuous than on previous exam 34. CT chest 08/29/2019-multiple pulmonary metastases, new from 04/21/2016 35. Cycle 1 FOLFIRI/bevacizumab 09/09/2019 36. Cycle 2 FOLFIRI/bevacizumab 09/26/2019 37. Cycle 3 FOLFIRI/bevacizumab 10/10/2019 38. Cycle 4 FOLFIRI/bevacizumab 10/24/2019 39. Cycle 5 FOLFIRI/bevacizumab 11/07/2019 40. CT chest 11/14/2019-decreased size of lung nodules, no new lesions, hepatic steatosis 41. Cycle 6 FOLFIRI/bevacizumab 11/21/2019 42. Cycle 7 FOLFIRI/bevacizumab 12/05/2019 43. Cycle 8 FOLFIRI/bevacizumab 12/19/2019 44. Cycle 9 FOLFIRI/bevacizumab 01/02/2020 45. Cycle 10 FOLFIRI/bevacizumab 01/16/2020 46. CT chest 01/29/2020-stable bilateral pulmonary metastases. No new or progressive metastatic disease in the chest. 47. Cycle 11 FOLFIRI/bevacizumab 01/30/2020 48. Cycle 12 FOLFIRI/bevacizumab 02/19/2020 49. Cycle 13 FOLFIRI/bevacizumab 03/12/2020 50. Cycle 14 FOLFIRI/bevacizumab 04/02/2020 51. Cycle 15 FOLFIRI/bevacizumab 04/23/2020 52. Cycle 16 FOLFIRI/bevacizumab 05/21/2020 53. CT chest 06/09/2020-mild progression pulmonary metastasis. Some lesions have increased in size while others are similar. 54. Cycle 1 irinotecan/Panitumumab 06/18/2020 55. Cycle 2 irinotecan/Panitumumab 07/02/2020 56. Cycle 3 irinotecan/panitumumab 07/16/2020 57. Cycle 4 irinotecan/Panitumumab 07/30/2020 58. Cycle 5 irinotecan/Panitumumab 08/13/2020, Emend added 59. Cycle 6 irinotecan/Panitumumab 08/27/2020 60. CT chest 09/08/2020-decreased size of pulmonary nodules, no progressive disease 61. Cycle 7  irinotecan/panitumumab 09/02/2020 62. Cycle 8 irinotecan/panitumumab 09/24/2020  2. Rectal bleeding and constipation secondary to #1  3. History of peripheral vascular disease, status post left lower extremity vascular bypass surgery in April 2017  4. History of nephrolithiasis  5. History of Graves' disease treated with radioactive iodine  6. Anxiety/depression  7. Hypertension  8. Hospitalization 01/19/2016 with wound dehiscence status post secondary suture closure of abdominal wall  9. 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 Hyperglycemia 06/20/2016-diagnosed with diabetes, maintained on insulin  11.Positive COVID test 12/13/2018  12. Hospitalized with seizure activity/DKA. Now on Keppra, insulin adjusted. 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  He continues to tolerate the irinotecan and panitumumab well.  He will receive intravenous magnesium supplementation today.  He will complete another cycle of chemotherapy today.  Jeff [name] will return for an office visit in the next cycle of chemotherapy in 2 weeks.  Betsy Coder, MD  09/24/2020  11:35 AM

## 2020-09-24 NOTE — Progress Notes (Signed)
Per Dr. Benay Spice, ok to treat today with Mg 1.5.  Verbal order received for 2mg  Mg IV infusion along with treatment today.

## 2020-09-25 ENCOUNTER — Other Ambulatory Visit: Payer: Self-pay

## 2020-09-25 ENCOUNTER — Telehealth: Payer: Self-pay | Admitting: Neurology

## 2020-09-25 DIAGNOSIS — R400 Somnolence: Secondary | ICD-10-CM

## 2020-09-25 NOTE — Telephone Encounter (Signed)
Patient's wife called and said they've still not heard from any office about scheduling a sleep study. She'd like to be sure the referral was sent.

## 2020-09-25 NOTE — Progress Notes (Signed)
Home Sleep study order placed

## 2020-09-25 NOTE — Telephone Encounter (Signed)
Pt wife called no answer left a voice mail informing her that referral for sleep study was placed and she should hear from them soon

## 2020-10-04 ENCOUNTER — Other Ambulatory Visit: Payer: Self-pay | Admitting: Oncology

## 2020-10-08 ENCOUNTER — Inpatient Hospital Stay: Payer: Medicaid Other

## 2020-10-08 ENCOUNTER — Other Ambulatory Visit: Payer: Self-pay

## 2020-10-08 ENCOUNTER — Inpatient Hospital Stay (HOSPITAL_BASED_OUTPATIENT_CLINIC_OR_DEPARTMENT_OTHER): Payer: Medicaid Other | Admitting: Nurse Practitioner

## 2020-10-08 ENCOUNTER — Encounter: Payer: Self-pay | Admitting: Nurse Practitioner

## 2020-10-08 VITALS — BP 144/80 | HR 93 | Temp 98.2°F | Resp 18 | Ht 70.0 in | Wt 243.4 lb

## 2020-10-08 DIAGNOSIS — C189 Malignant neoplasm of colon, unspecified: Secondary | ICD-10-CM | POA: Diagnosis not present

## 2020-10-08 DIAGNOSIS — C187 Malignant neoplasm of sigmoid colon: Secondary | ICD-10-CM

## 2020-10-08 DIAGNOSIS — C787 Secondary malignant neoplasm of liver and intrahepatic bile duct: Secondary | ICD-10-CM

## 2020-10-08 DIAGNOSIS — Z5112 Encounter for antineoplastic immunotherapy: Secondary | ICD-10-CM | POA: Diagnosis not present

## 2020-10-08 LAB — CMP (CANCER CENTER ONLY)
ALT: 57 U/L — ABNORMAL HIGH (ref 0–44)
AST: 46 U/L — ABNORMAL HIGH (ref 15–41)
Albumin: 4 g/dL (ref 3.5–5.0)
Alkaline Phosphatase: 120 U/L (ref 38–126)
Anion gap: 12 (ref 5–15)
BUN: 9 mg/dL (ref 6–20)
CO2: 24 mmol/L (ref 22–32)
Calcium: 8.8 mg/dL — ABNORMAL LOW (ref 8.9–10.3)
Chloride: 102 mmol/L (ref 98–111)
Creatinine: 0.9 mg/dL (ref 0.61–1.24)
GFR, Estimated: >60 mL/min (ref >60)
Glucose, Bld: 354 mg/dL — ABNORMAL HIGH (ref 70–99)
Potassium: 3.6 mmol/L (ref 3.5–5.1)
Sodium: 138 mmol/L (ref 135–145)
Total Bilirubin: 0.4 mg/dL (ref 0.3–1.2)
Total Protein: 7 g/dL (ref 6.5–8.1)

## 2020-10-08 LAB — CBC WITH DIFFERENTIAL (CANCER CENTER ONLY)
Abs Immature Granulocytes: 0.04 10*3/uL (ref 0.00–0.07)
Basophils Absolute: 0 10*3/uL (ref 0.0–0.1)
Basophils Relative: 0 %
Eosinophils Absolute: 0.3 10*3/uL (ref 0.0–0.5)
Eosinophils Relative: 4 %
HCT: 44.2 % (ref 39.0–52.0)
Hemoglobin: 13.5 g/dL (ref 13.0–17.0)
Immature Granulocytes: 1 %
Lymphocytes Relative: 36 %
Lymphs Abs: 2.7 10*3/uL (ref 0.7–4.0)
MCH: 26.9 pg (ref 26.0–34.0)
MCHC: 30.5 g/dL (ref 30.0–36.0)
MCV: 88.2 fL (ref 80.0–100.0)
Monocytes Absolute: 0.7 10*3/uL (ref 0.1–1.0)
Monocytes Relative: 9 %
Neutro Abs: 3.7 10*3/uL (ref 1.7–7.7)
Neutrophils Relative %: 50 %
Platelet Count: 194 10*3/uL (ref 150–400)
RBC: 5.01 MIL/uL (ref 4.22–5.81)
RDW: 16.3 % — ABNORMAL HIGH (ref 11.5–15.5)
WBC Count: 7.4 10*3/uL (ref 4.0–10.5)
nRBC: 0 % (ref 0.0–0.2)

## 2020-10-08 LAB — MAGNESIUM: Magnesium: 1.6 mg/dL — ABNORMAL LOW (ref 1.7–2.4)

## 2020-10-08 MED ORDER — PALONOSETRON HCL INJECTION 0.25 MG/5ML
0.2500 mg | Freq: Once | INTRAVENOUS | Status: AC
  Administered 2020-10-08: 0.25 mg via INTRAVENOUS
  Filled 2020-10-08: qty 5

## 2020-10-08 MED ORDER — SODIUM CHLORIDE 0.9 % IV SOLN
180.0000 mg/m2 | Freq: Once | INTRAVENOUS | Status: AC
  Administered 2020-10-08: 420 mg via INTRAVENOUS
  Filled 2020-10-08: qty 21

## 2020-10-08 MED ORDER — SODIUM CHLORIDE 0.9 % IV SOLN
Freq: Once | INTRAVENOUS | Status: AC
  Filled 2020-10-08: qty 250

## 2020-10-08 MED ORDER — SODIUM CHLORIDE 0.9 % IV SOLN
150.0000 mg | Freq: Once | INTRAVENOUS | Status: AC
  Administered 2020-10-08: 150 mg via INTRAVENOUS
  Filled 2020-10-08: qty 5

## 2020-10-08 MED ORDER — SODIUM CHLORIDE 0.9 % IV SOLN
6.0000 mg/kg | Freq: Once | INTRAVENOUS | Status: AC
  Administered 2020-10-08: 700 mg via INTRAVENOUS
  Filled 2020-10-08: qty 20

## 2020-10-08 MED ORDER — ATROPINE SULFATE 1 MG/ML IJ SOLN
0.4000 mg | Freq: Once | INTRAMUSCULAR | Status: AC
  Administered 2020-10-08: 0.4 mg via INTRAVENOUS
  Filled 2020-10-08: qty 1

## 2020-10-08 MED ORDER — SODIUM CHLORIDE 0.9% FLUSH
10.0000 mL | INTRAVENOUS | Status: DC | PRN
  Administered 2020-10-08: 10 mL
  Filled 2020-10-08: qty 10

## 2020-10-08 MED ORDER — HEPARIN SOD (PORK) LOCK FLUSH 100 UNIT/ML IV SOLN
500.0000 [IU] | Freq: Once | INTRAVENOUS | Status: AC | PRN
  Administered 2020-10-08: 500 [IU]
  Filled 2020-10-08: qty 5

## 2020-10-08 MED ORDER — DEXAMETHASONE SODIUM PHOSPHATE 10 MG/ML IJ SOLN
5.0000 mg | Freq: Once | INTRAMUSCULAR | Status: AC
Start: 2020-10-08 — End: 2020-10-08
  Administered 2020-10-08: 5 mg via INTRAVENOUS
  Filled 2020-10-08: qty 1

## 2020-10-08 NOTE — Progress Notes (Signed)
[facility] OFFICE PROGRESS NOTE   Diagnosis: Colon cancer  INTERVAL HISTORY:   [name] returns as scheduled.  He completed cycle 8 irinotecan/Panitumumab 09/24/2020.  He has mild intermittent nausea.  No mouth sores.  He had diarrhea last week after taking Celebrex.  Skin rash is unchanged.  He continues doxycycline.  He is utilizing hydrocortisone cream and fluticasone.  Also applying a moisturizing lotion.  Objective:  Vital signs in last 24 hours:  Blood pressure (!) 144/80, pulse 93, temperature 98.2 F (36.8 C), temperature source Oral, resp. rate 18, height $RemoveBe'5\' 10"'GWTycoRmA$  (1.778 m), weight 243 lb 6.4 oz (110.4 kg), SpO2 100 %.    HEENT: No thrush or ulcers. Resp: Lungs clear bilaterally. Cardio: Regular rate and rhythm. GI: Abdomen soft and nontender.  No hepatomegaly. Vascular: No leg edema.  Skin: Acne type rash over the face and trunk.  Few areas of paronychia at the fingernail beds. Port-A-Cath without erythema.   Lab Results:  Lab Results  Component Value Date   WBC 7.4 10/08/2020   HGB 13.5 10/08/2020   HCT 44.2 10/08/2020   MCV 88.2 10/08/2020   PLT 194 10/08/2020   NEUTROABS 3.7 10/08/2020    Imaging:  No results found.  Medications: I have reviewed the patient's current medications.  Assessment/Plan: 1. 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 CTschest, abdomen, and pelvis on 12/11/2015-indeterminate tiny pulmonary nodules, multiple liver metastases, small nodes in the sigmoid mesocolon  3. Laparoscopic sigmoid colectomy, liver biopsy, Port-A-Cath placement 01/14/2016  4. Pathology sigmoid colon resection-colonic adenocarcinoma, 5.3 cm extending into pericolonic connective tissue, positive lymph-vascular invasion, positive perineural invasion, negative margins, metastatic carcinoma in9 of 28 lymph nodes  5. Pathology liver biopsy-metastatic  colorectal adenocarcinoma  6. MSI stable; mismatch repair protein normal  7. 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 Cycle 1 FOLFOX 02/02/2016  9. Cycle 2 FOLFOX 02/15/2016  10. Cycle 3 FOLFOX 02/29/2016  11. Cycle 4 FOLFOX 03/14/2016  12. Cycle 5 FOLFOX 03/28/2016  13. Cycle 6 FOLFOX 04/11/2016 (oxaliplatin held secondary to thrombocytopenia)  14. 04/21/2016 restaging CTs-difficulty evaluating liver lesions due to hepatic steatosis. Stable right adrenal nodule. No adenopathy or local recurrence near the rectosigmoid anastomotic site.  15. Cycle 7 FOLFOX 04/25/2016  16. MRI liver 05/02/2016-partial improvement in hepatic metastases  17. Cycle 8 FOLFOX 05/10/2016  18. Cycle 9 FOLFOX 05/23/2016 (oxaliplatin held due to thrombocytopenia)  19. Cycle 10 FOLFOX 06/06/2016 (oxaliplatin dose reduced due to thrombocytopenia)  20. Cycle 11 FOLFOX 06/27/2016 (oxaliplatin held due to neuropathy)  21. Cycle 12 FOLFOX 07/11/2016 (oxaliplatin held) 22. Initiation of maintenance Xeloda 7 days on/7 days off 07/27/2016 23. MRI liver 11/18/2016-decrease in hepatic metastatic disease. No new or progressive disease identified within the abdomen. 24. Continuation of Xeloda 7 days on/7 days off 25. MRI liver 04/27/2017-previous liver lesions not identified, no new lesions, no lymphadenopathy 26. Xeloda continued 7 days on/7 days off 27. MRI liver 12/04/2017 -no evidence of metastatic disease, hepatic steatosis 28. Xeloda continued 7 days on/7 days off 29. MRI liver 07/15/2018-no evidence of metastatic disease. Stable severe hepatic steatosis. 30. Xeloda continued 7 days on/7 days off 31. MRI liver 03/16/2019-hepatic steatosis, no liver mass, focal area of intrahepatic biliary dilatation in segments 2 and 3 of the left lobe-increased 32. Xeloda continued 7 days on/7 days off 33. MRI abdomen 08/19/2019-no findings to suggest liver metastases. Bilateral lung nodules measuring up to 2.8 cm,  progressive  and more conspicuous than on previous exam 34. CT chest 08/29/2019-multiple pulmonary metastases, new from 04/21/2016 35. Cycle 1 FOLFIRI/bevacizumab 09/09/2019 36. Cycle 2 FOLFIRI/bevacizumab 09/26/2019 37. Cycle 3 FOLFIRI/bevacizumab 10/10/2019 38. Cycle 4 FOLFIRI/bevacizumab 10/24/2019 39. Cycle 5 FOLFIRI/bevacizumab 11/07/2019 40. CT chest 11/14/2019-decreased size of lung nodules, no new lesions, hepatic steatosis 41. Cycle 6 FOLFIRI/bevacizumab 11/21/2019 42. Cycle 7 FOLFIRI/bevacizumab 12/05/2019 43. Cycle 8 FOLFIRI/bevacizumab 12/19/2019 44. Cycle 9 FOLFIRI/bevacizumab 01/02/2020 45. Cycle 10 FOLFIRI/bevacizumab 01/16/2020 46. CT chest 01/29/2020-stable bilateral pulmonary metastases. No new or progressive metastatic disease in the chest. 47. Cycle 11 FOLFIRI/bevacizumab 01/30/2020 48. Cycle 12 FOLFIRI/bevacizumab 02/19/2020 49. Cycle 13 FOLFIRI/bevacizumab 03/12/2020 50. Cycle 14 FOLFIRI/bevacizumab 04/02/2020 51. Cycle 15 FOLFIRI/bevacizumab 04/23/2020 52. Cycle 16 FOLFIRI/bevacizumab 05/21/2020 53. CT chest 06/09/2020-mild progression pulmonary metastasis. Some lesions have increased in size while others are similar. 54. Cycle 1 irinotecan/Panitumumab 06/18/2020 55. Cycle 2 irinotecan/Panitumumab 07/02/2020 56. Cycle 3 irinotecan/panitumumab 07/16/2020 57. Cycle 4 irinotecan/Panitumumab 07/30/2020 58. Cycle 5 irinotecan/Panitumumab 08/13/2020, Emend added 59. Cycle 6 irinotecan/Panitumumab 08/27/2020 60. CT chest 09/08/2020-decreased size of pulmonary nodules, no progressive disease 61. Cycle 7 irinotecan/panitumumab 09/02/2020 62. Cycle 8 irinotecan/panitumumab 09/24/2020 63. Cycle 9 irinotecan/Panitumumab 10/08/2020  2. Rectal bleeding and constipation secondary to #1  3. History of peripheral vascular disease, status post left lower extremity vascular bypass surgery in April 2017  4. History of nephrolithiasis  5. History of Graves' disease treated with radioactive iodine  6.  Anxiety/depression  7. Hypertension  8. Hospitalization 01/19/2016 with wound dehiscence status post secondary suture closure of abdominal wall  9. 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 Hyperglycemia 06/20/2016-diagnosed with diabetes, maintained on insulin  11.Positive COVID test 12/13/2018  12. Hospitalized with seizure activity/DKA. Now on Keppra, insulin adjusted. 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 Stmarie appears stable.  He has completed 8 cycles of irinotecan/Panitumumab.  Overall he is tolerating treatment well well.  He had diarrhea last week which he attributed to taking Celebrex.  We discussed the possibility the diarrhea was related to irinotecan and reviewed dosing instructions for Imodium.  He will contact the office if Imodium is not effective.  Panitumumab skin rash is stable.  He will continue current care.  Plan to proceed with cycle 9 irinotecan/Panitumumab today as scheduled.  We reviewed the CBC from today.  Counts adequate to proceed with treatment.  He will return for lab, follow-up, irinotecan/Panitumumab in 2 weeks.  He will contact the office in the interim as outlined above or with any other problems.    Ned Card ANP/GNP-BC   10/08/2020  9:09 AM

## 2020-10-15 ENCOUNTER — Other Ambulatory Visit: Payer: Self-pay | Admitting: Oncology

## 2020-10-17 DIAGNOSIS — S92902A Unspecified fracture of left foot, initial encounter for closed fracture: Secondary | ICD-10-CM

## 2020-10-17 HISTORY — DX: Unspecified fracture of left foot, initial encounter for closed fracture: S92.902A

## 2020-10-21 ENCOUNTER — Other Ambulatory Visit: Payer: Self-pay | Admitting: Nurse Practitioner

## 2020-10-21 DIAGNOSIS — C787 Secondary malignant neoplasm of liver and intrahepatic bile duct: Secondary | ICD-10-CM

## 2020-10-22 ENCOUNTER — Inpatient Hospital Stay (HOSPITAL_BASED_OUTPATIENT_CLINIC_OR_DEPARTMENT_OTHER): Payer: Medicaid Other | Admitting: Nurse Practitioner

## 2020-10-22 ENCOUNTER — Inpatient Hospital Stay: Payer: Medicaid Other

## 2020-10-22 ENCOUNTER — Other Ambulatory Visit: Payer: Self-pay

## 2020-10-22 ENCOUNTER — Encounter: Payer: Self-pay | Admitting: Nurse Practitioner

## 2020-10-22 ENCOUNTER — Inpatient Hospital Stay: Payer: Medicaid Other | Attending: Oncology

## 2020-10-22 VITALS — BP 134/70 | HR 104 | Temp 98.6°F | Resp 18 | Ht 70.0 in | Wt 243.6 lb

## 2020-10-22 DIAGNOSIS — R21 Rash and other nonspecific skin eruption: Secondary | ICD-10-CM | POA: Diagnosis not present

## 2020-10-22 DIAGNOSIS — C787 Secondary malignant neoplasm of liver and intrahepatic bile duct: Secondary | ICD-10-CM | POA: Diagnosis not present

## 2020-10-22 DIAGNOSIS — R197 Diarrhea, unspecified: Secondary | ICD-10-CM | POA: Diagnosis not present

## 2020-10-22 DIAGNOSIS — W19XXXA Unspecified fall, initial encounter: Secondary | ICD-10-CM | POA: Insufficient documentation

## 2020-10-22 DIAGNOSIS — C189 Malignant neoplasm of colon, unspecified: Secondary | ICD-10-CM | POA: Diagnosis not present

## 2020-10-22 DIAGNOSIS — C187 Malignant neoplasm of sigmoid colon: Secondary | ICD-10-CM | POA: Diagnosis present

## 2020-10-22 DIAGNOSIS — Z95828 Presence of other vascular implants and grafts: Secondary | ICD-10-CM

## 2020-10-22 DIAGNOSIS — S92902A Unspecified fracture of left foot, initial encounter for closed fracture: Secondary | ICD-10-CM | POA: Insufficient documentation

## 2020-10-22 LAB — CBC WITH DIFFERENTIAL (CANCER CENTER ONLY)
Abs Immature Granulocytes: 0.04 10*3/uL (ref 0.00–0.07)
Basophils Absolute: 0 10*3/uL (ref 0.0–0.1)
Basophils Relative: 0 %
Eosinophils Absolute: 0.2 10*3/uL (ref 0.0–0.5)
Eosinophils Relative: 3 %
HCT: 40.1 % (ref 39.0–52.0)
Hemoglobin: 12.3 g/dL — ABNORMAL LOW (ref 13.0–17.0)
Immature Granulocytes: 1 %
Lymphocytes Relative: 29 %
Lymphs Abs: 2.1 10*3/uL (ref 0.7–4.0)
MCH: 26.8 pg (ref 26.0–34.0)
MCHC: 30.7 g/dL (ref 30.0–36.0)
MCV: 87.4 fL (ref 80.0–100.0)
Monocytes Absolute: 0.6 10*3/uL (ref 0.1–1.0)
Monocytes Relative: 8 %
Neutro Abs: 4.3 10*3/uL (ref 1.7–7.7)
Neutrophils Relative %: 59 %
Platelet Count: 204 10*3/uL (ref 150–400)
RBC: 4.59 MIL/uL (ref 4.22–5.81)
RDW: 16.7 % — ABNORMAL HIGH (ref 11.5–15.5)
WBC Count: 7.3 10*3/uL (ref 4.0–10.5)
nRBC: 0 % (ref 0.0–0.2)

## 2020-10-22 LAB — CMP (CANCER CENTER ONLY)
ALT: 33 U/L (ref 0–44)
AST: 39 U/L (ref 15–41)
Albumin: 3.8 g/dL (ref 3.5–5.0)
Alkaline Phosphatase: 95 U/L (ref 38–126)
Anion gap: 13 (ref 5–15)
BUN: 7 mg/dL (ref 6–20)
CO2: 22 mmol/L (ref 22–32)
Calcium: 8.3 mg/dL — ABNORMAL LOW (ref 8.9–10.3)
Chloride: 105 mmol/L (ref 98–111)
Creatinine: 0.96 mg/dL (ref 0.61–1.24)
GFR, Estimated: >60 mL/min (ref >60)
Glucose, Bld: 291 mg/dL — ABNORMAL HIGH (ref 70–99)
Potassium: 3.4 mmol/L — ABNORMAL LOW (ref 3.5–5.1)
Sodium: 140 mmol/L (ref 135–145)
Total Bilirubin: 0.5 mg/dL (ref 0.3–1.2)
Total Protein: 6.8 g/dL (ref 6.5–8.1)

## 2020-10-22 LAB — MAGNESIUM: Magnesium: 1.5 mg/dL — ABNORMAL LOW (ref 1.7–2.4)

## 2020-10-22 MED ORDER — ALTEPLASE 2 MG IJ SOLR
2.0000 mg | Freq: Once | INTRAMUSCULAR | Status: DC | PRN
  Filled 2020-10-22: qty 2

## 2020-10-22 NOTE — Progress Notes (Signed)
[facility] OFFICE PROGRESS NOTE   Diagnosis: Colon cancer  INTERVAL HISTORY:   Mr. Sena returns as scheduled.  He completed another cycle of irinotecan/Panitumumab 10/08/2020.  He slipped and fell over the weekend, fractured left foot.  He had "pins" placed and expects to undergo more surgery in the next few weeks.  He continues to have mild intermittent nausea and loose stools.  Skin rash is stable.  He continues doxycycline.  Objective:  Vital signs in last 24 hours:  Blood pressure 134/70, pulse (!) 104, temperature 98.6 F (37 C), temperature source Oral, resp. rate 18, height 5' 10" (1.778 m), weight 243 lb 9.6 oz (110.5 kg), SpO2 100 %.    HEENT: No thrush or ulcers. Resp: Lungs clear bilaterally. Cardio: Regular rate and rhythm. GI: Abdomen soft and nontender.  No hepatomegaly. Vascular: No right leg edema.  Left lower leg with bandage/wrap in place. Skin: Acne-like rash scattered over the face, anterior chest and upper back. Port-A-Cath without erythema.  Lab Results:  Lab Results  Component Value Date   WBC 7.3 10/22/2020   HGB 12.3 (L) 10/22/2020   HCT 40.1 10/22/2020   MCV 87.4 10/22/2020   PLT 204 10/22/2020   NEUTROABS 4.3 10/22/2020    Imaging:  No results found.  Medications: I have reviewed the patient's current medications.  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, biopsy confirmed adenocarcinoma         CTs chest, abdomen, and pelvis on 12/11/2015-indeterminate tiny pulmonary nodules, multiple liver metastases, small nodes in the sigmoid mesocolon Laparoscopic sigmoid colectomy, liver biopsy, Port-A-Cath placement 01/14/2016 Pathology sigmoid colon resection- colonic adenocarcinoma, 5.3 cm extending into pericolonic connective tissue, positive lymph-vascular invasion, positive perineural invasion, negative margins, metastatic carcinoma in 9 of 28 lymph  nodes Pathology liver biopsy-metastatic colorectal adenocarcinoma MSI stable; mismatch repair protein normal APC alteration, K RAS wild-type, no BRAF mutation Cycle 1 FOLFOX 02/02/2016 Cycle 2 FOLFOX 02/15/2016 Cycle 3 FOLFOX 02/29/2016 Cycle 4 FOLFOX 03/14/2016 Cycle 5 FOLFOX 03/28/2016 Cycle 6 FOLFOX 04/11/2016 (oxaliplatin held secondary to thrombocytopenia) 04/21/2016 restaging CTs-difficulty evaluating liver lesions due to hepatic steatosis. Stable right adrenal nodule. No adenopathy or local recurrence near the rectosigmoid anastomotic site. Cycle 7 FOLFOX 04/25/2016 MRI liver 05/02/2016-partial improvement in hepatic metastases Cycle 8 FOLFOX 05/10/2016 Cycle 9 FOLFOX 05/23/2016 (oxaliplatin held due to thrombocytopenia) Cycle 10 FOLFOX 06/06/2016 (oxaliplatin dose reduced due to thrombocytopenia) Cycle 11 FOLFOX 06/27/2016 (oxaliplatin held due to neuropathy) Cycle 12 FOLFOX 07/11/2016 (oxaliplatin held) Initiation of maintenance Xeloda 7 days on/7 days off 07/27/2016 MRI liver 11/18/2016-decrease in hepatic metastatic disease. No new or progressive disease identified within the abdomen. Continuation of Xeloda 7 days on/7 days off MRI liver 04/27/2017-previous liver lesions not identified, no new lesions, no lymphadenopathy Xeloda continued 7 days on/7 days off MRI liver 12/04/2017 - no evidence of metastatic disease, hepatic steatosis Xeloda continued 7 days on/7 days off MRI liver 07/15/2018- no evidence of metastatic disease.  Stable severe hepatic steatosis. Xeloda continued 7 days on/7 days off MRI liver 03/16/2019-hepatic steatosis, no liver mass, focal area of intrahepatic biliary dilatation in segments 2 and 3 of the left lobe-increased Xeloda continued 7 days on/7 days off MRI abdomen 08/19/2019-no findings to suggest liver metastases.  Bilateral lung nodules measuring up to 2.8 cm, progressive and more conspicuous than on previous exam CT chest 08/29/2019-multiple pulmonary  metastases, new from 04/21/2016 Cycle 1 FOLFIRI/bevacizumab 09/09/2019 Cycle 2 FOLFIRI/bevacizumab 09/26/2019  Cycle 3 FOLFIRI/bevacizumab 10/10/2019  Cycle 4 FOLFIRI/bevacizumab 10/24/2019 Cycle 5 FOLFIRI/bevacizumab 11/07/2019 CT chest 11/14/2019-decreased size of lung nodules, no new lesions, hepatic steatosis Cycle 6 FOLFIRI/bevacizumab 11/21/2019 Cycle 7 FOLFIRI/bevacizumab 12/05/2019 Cycle 8 FOLFIRI/bevacizumab 12/19/2019 Cycle 9 FOLFIRI/bevacizumab 01/02/2020 Cycle 10 FOLFIRI/bevacizumab 01/16/2020 CT chest 01/29/2020-stable bilateral pulmonary metastases.  No new or progressive metastatic disease in the chest. Cycle 11 FOLFIRI/bevacizumab 01/30/2020 Cycle 12 FOLFIRI/bevacizumab 02/19/2020 Cycle 13 FOLFIRI/bevacizumab 03/12/2020 Cycle 14 FOLFIRI/bevacizumab 04/02/2020 Cycle 15 FOLFIRI/bevacizumab 04/23/2020 Cycle 16 FOLFIRI/bevacizumab 05/21/2020 CT chest 06/09/2020-mild progression pulmonary metastasis.  Some lesions have increased in size while others are similar. Cycle 1 irinotecan/Panitumumab 06/18/2020 Cycle 2 irinotecan/Panitumumab 07/02/2020 Cycle 3 irinotecan/panitumumab 07/16/2020 Cycle 4 irinotecan/Panitumumab 07/30/2020 Cycle 5 irinotecan/Panitumumab 08/13/2020, Emend added Cycle 6 irinotecan/Panitumumab 08/27/2020 CT chest 09/08/2020-decreased size of pulmonary nodules, no progressive disease Cycle 7 irinotecan/panitumumab 09/02/2020 Cycle 8 irinotecan/panitumumab 09/24/2020 Cycle 9 irinotecan/Panitumumab 10/08/2020 10/22/2020 treatment held due to left foot fracture, need for surgery   2.   Rectal bleeding and constipation secondary to #1   3.   History of peripheral vascular disease, status post left lower extremity vascular bypass surgery in April 2017   4.   History of nephrolithiasis   5.   History of Graves' disease treated with radioactive iodine   6.   Anxiety/depression   7.   Hypertension   8.   Hospitalization 01/19/2016 with wound dehiscence status post secondary suture closure  of abdominal wall   9.   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 Hyperglycemia 06/20/2016-diagnosed with diabetes, maintained on insulin   11.  Positive COVID test 12/13/2018   12. Hospitalized with seizure activity/DKA. Now on Keppra, insulin adjusted. 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 Recurrent seizures 04/08/2020-Keppra dose increased, CT brain without acute change      Disposition: Mr. Pauwels appears stable.  He is on active treatment with irinotecan/Panitumumab every 2 weeks.  He had a recent fall with a "foot fracture".  He had surgery and expects to undergo more surgery in the next few weeks.  We decided to hold today's chemotherapy.  His wife will contact us with the surgery date.  Anticipate we could resume treatment the first week of July.  He requests to return for follow-up and treatment on 11/24/2020.  We are available to see him sooner if needed.    Ned Card ANP/GNP-BC   10/22/2020  10:26 AM

## 2020-10-23 ENCOUNTER — Telehealth: Payer: Self-pay

## 2020-10-23 NOTE — Telephone Encounter (Signed)
Called no answer message left on wife's answering machine to increase magnesium to TID encouraged to call for any questions concerns or changes

## 2020-10-23 NOTE — Telephone Encounter (Signed)
-----   Message from Owens Shark, NP sent at 10/23/2020  9:25 AM EDT ----- Please have him increase magnesium to 3 times a day.  Thanks ----- Message ----- From: Raynelle Bring, LPN Sent: [phone]   2:29 PM EDT To: Owens Shark, NP  Pt wife states he is taking all meds as scheduled    [place]  ----- Message ----- From: Owens Shark, NP Sent: 10/22/2020  12:46 PM EDT To: [facility]  Please confirm with his wife he is taking magnesium 400 mg twice daily.  Level is low today.

## 2020-10-26 ENCOUNTER — Encounter: Payer: Medicaid Other | Admitting: Podiatry

## 2020-10-28 ENCOUNTER — Other Ambulatory Visit: Payer: Self-pay | Admitting: *Deleted

## 2020-10-28 MED ORDER — OLANZAPINE 5 MG PO TABS: ORAL_TABLET | ORAL | 1 refills | Status: DC

## 2020-10-28 NOTE — Progress Notes (Signed)
Received refill request for olanzapine from pharmacy. Refill approved.

## 2020-11-02 ENCOUNTER — Ambulatory Visit: Payer: Self-pay | Admitting: Podiatry

## 2020-11-02 ENCOUNTER — Other Ambulatory Visit: Payer: Self-pay

## 2020-11-02 ENCOUNTER — Other Ambulatory Visit: Payer: Self-pay | Admitting: Podiatry

## 2020-11-02 ENCOUNTER — Ambulatory Visit (INDEPENDENT_AMBULATORY_CARE_PROVIDER_SITE_OTHER): Payer: Medicaid Other | Admitting: Podiatry

## 2020-11-02 DIAGNOSIS — S93325A Dislocation of tarsometatarsal joint of left foot, initial encounter: Secondary | ICD-10-CM

## 2020-11-02 DIAGNOSIS — M79672 Pain in left foot: Secondary | ICD-10-CM

## 2020-11-02 NOTE — Progress Notes (Signed)
  Subjective:  Patient ID: Jeff [name], male    DOB: 29-Jun-1965,  MRN: [id number]  Chief Complaint  Patient presents with   Routine Post Op    POV #1 -pt deneis N/V/F/Ch -pt states," it hurts, 7/10 sharp intermittnet pian." - Tx: crutches, and elevation -Lt 4th toe medial side with draiange   55 y.o. male presents with the above complaint. History confirmed with patient.   Objective:  Physical Exam: warm, good capillary refill, no trophic changes or ulcerative lesions, normal DP and PT pulses, and normal sensory exam. Left Foot: Incision healing well with intact suture and staple. Pin fixation intact. Assessment:   1. Lisfranc dislocation, left, initial encounter    Plan:  Patient was evaluated and treated and all questions answered.  Left foot Lisfranc dislocation -Would benefit from operative management for this injury. All risks, benefits, and alternatives discussed with patient. No guarantees given. Consent reviewed and signed by patient. -Planned procedures: Left foot ORIF midfoot fracture with joint fusions as indicated. -ASA 3 - Patient with moderate systemic disease with functional limitations   No follow-ups on file.

## 2020-11-02 NOTE — H&P (View-Only) (Signed)
  Subjective:  Patient ID: Jeff [name], male    DOB: [birth year],  MRN: [id number]  Chief Complaint  Patient presents with   Routine Post Op    POV #1 -pt deneis N/V/F/Ch -pt states," it hurts, 7/10 sharp intermittnet pian." - Tx: crutches, and elevation -Lt 4th toe medial side with draiange   54 y.o. male presents with the above complaint. History confirmed with patient.   Objective:  Physical Exam: warm, good capillary refill, no trophic changes or ulcerative lesions, normal DP and PT pulses, and normal sensory exam. Left Foot: Incision healing well with intact suture and staple. Pin fixation intact. Assessment:   1. Lisfranc dislocation, left, initial encounter    Plan:  Patient was evaluated and treated and all questions answered.  Left foot Lisfranc dislocation -Would benefit from operative management for this injury. All risks, benefits, and alternatives discussed with patient. No guarantees given. Consent reviewed and signed by patient. -Planned procedures: Left foot ORIF midfoot fracture with joint fusions as indicated. -ASA 3 - Patient with moderate systemic disease with functional limitations   No follow-ups on file.

## 2020-11-03 ENCOUNTER — Encounter: Payer: Self-pay | Admitting: Urology

## 2020-11-04 ENCOUNTER — Telehealth: Payer: Self-pay | Admitting: Urology

## 2020-11-04 NOTE — Telephone Encounter (Signed)
DOS - 11/11/20  OPEN REDUCTION INTERNAL FIXATION OF MIDFOOT FRACTURES --- 25366 JOINT FUSION --- 44034   [employer] EFFECTIVE DATE - 11/14/19  PLAN DEDUCTIBLE - $0.00 OUT OF POCKET - Member's individual out-of-pocket maximum has no limit. COINSURANCE - 0% COPAY - $0.00   RECEIVED FAX FROM [facility] STATING CPT CODES 74259 X'S  3 AND 56387 HAVE BEEN APPROVED, AUTH # [id number], GOOD FROM 11/11/20 - 02/09/21.

## 2020-11-05 ENCOUNTER — Other Ambulatory Visit: Payer: Medicaid Other

## 2020-11-05 ENCOUNTER — Ambulatory Visit: Payer: Medicaid Other

## 2020-11-05 ENCOUNTER — Ambulatory Visit: Payer: Medicaid Other | Admitting: Oncology

## 2020-11-10 ENCOUNTER — Encounter (HOSPITAL_BASED_OUTPATIENT_CLINIC_OR_DEPARTMENT_OTHER): Payer: Self-pay | Admitting: Podiatry

## 2020-11-10 ENCOUNTER — Other Ambulatory Visit: Payer: Self-pay

## 2020-11-10 NOTE — Progress Notes (Addendum)
Addendum:  received pt's pcp H&P dated 11-10-2020 via fax from Jeff [name] office , placed in chart.   Spoke w/ via phone for pre-op interview--- [name], Jeff [name], pt poor historian Lab needs dos----  CBCdiff, BMP, A1c             Lab results------ current ekg in epic/ chart COVID test -----patient states asymptomatic no test needed Arrive at ------- 1100 on 11-11-2020 NPO after MN NO Solid Food.  Clear liquids from MN until--- 1000 Med rec completed Medications to take morning of surgery ----- Flomax, Keppra, Effexor, Lopressor, Norvasc, Colace, Synthroid Diabetic medication ----- do not take metformin/ farxiga and do to not do insulin tresiba/ novolog Patient instructed no nail polish to be worn day of surgery Patient instructed to bring photo id and insurance card day of surgery Patient aware to have Driver (ride ) / caregiver   for 24 hours after surgery -- wife Patient Special Instructions ----- n/a Pre-Op special Istructions ----- pt wife will need to be w/ pt in pre-op due to being poor historian / does not know his meds.  Have not received pt's pcp H&P yet from Jeff [name] office. Patient verbalized understanding of instructions that were given at this phone interview. Patient denies shortness of breath, chest pain, fever, cough at this phone interview.    Anesthesia :  HTN;  PAD s/p left pop-TPT 04/ 2017 w/ no residual;  seizure disorder, last seizure 11/ 2021;  metastatic colon cancer s/p colectomy 08/ 2017, started chemo 09/ 2017 and currently every 2 wks, last chemo 10-22-2020 on hold until after foot surgery;  pt wife stated pt denies chest pain, sob, and no peripheral swelling.  Stated last nitro taken >one yr.  PCP: Jeff [name] (lov last week) Cardiologist : Jeff [name] 05-19-2020 epic) Oncologist:  Jeff [name] 09-24-2020 epic) followed Jeff [name] 10-22-2020 epic Chest x-ray : chest CT 09-08-2020 EKG : 05-19-2020 Echo : 01-05-2015 Stress test: no Cardiac Cath  : 06-12-2012 Activity level: denies sob w/ any activity Sleep Study/ CPAP :  NO Fasting Blood Sugar :  130--168    / Checks Blood Sugar -- times a day:  2-4 times daily Blood Thinner/ Instructions /Last Dose:  no ASA / Instructions/ Last Dose :  ASA 81mg /  per pt wife last dose 06-20-20232

## 2020-11-11 ENCOUNTER — Encounter (HOSPITAL_BASED_OUTPATIENT_CLINIC_OR_DEPARTMENT_OTHER): Payer: Self-pay | Admitting: Podiatry

## 2020-11-11 ENCOUNTER — Telehealth: Payer: Self-pay | Admitting: *Deleted

## 2020-11-11 ENCOUNTER — Ambulatory Visit (HOSPITAL_BASED_OUTPATIENT_CLINIC_OR_DEPARTMENT_OTHER)
Admission: RE | Admit: 2020-11-11 | Discharge: 2020-11-11 | Disposition: A | Discharge status: Home / Self Care | Payer: Medicaid Other | Attending: Podiatry | Admitting: Podiatry

## 2020-11-11 ENCOUNTER — Encounter (HOSPITAL_BASED_OUTPATIENT_CLINIC_OR_DEPARTMENT_OTHER)
Admission: RE | Disposition: A | Discharge status: Home / Self Care | Payer: Self-pay | Source: Home / Self Care | Attending: Podiatry

## 2020-11-11 ENCOUNTER — Ambulatory Visit (HOSPITAL_BASED_OUTPATIENT_CLINIC_OR_DEPARTMENT_OTHER): Payer: Medicaid Other | Admitting: Anesthesiology

## 2020-11-11 DIAGNOSIS — X58XXXA Exposure to other specified factors, initial encounter: Secondary | ICD-10-CM | POA: Insufficient documentation

## 2020-11-11 DIAGNOSIS — M79672 Pain in left foot: Secondary | ICD-10-CM | POA: Diagnosis not present

## 2020-11-11 DIAGNOSIS — S93325S Dislocation of tarsometatarsal joint of left foot, sequela: Secondary | ICD-10-CM | POA: Diagnosis not present

## 2020-11-11 DIAGNOSIS — S93325A Dislocation of tarsometatarsal joint of left foot, initial encounter: Secondary | ICD-10-CM | POA: Diagnosis not present

## 2020-11-11 HISTORY — PX: OPEN REDUCTION INTERNAL FIXATION (ORIF) FOOT LISFRANC FRACTURE: SHX5990

## 2020-11-11 HISTORY — DX: Calculus of kidney: N20.0

## 2020-11-11 HISTORY — DX: Other secondary thrombocytopenia: D69.59

## 2020-11-11 HISTORY — DX: Type 2 diabetes mellitus without complications: E11.9

## 2020-11-11 HISTORY — DX: Nausea: R11.0

## 2020-11-11 HISTORY — DX: Other chronic pain: G89.29

## 2020-11-11 HISTORY — DX: Dorsalgia, unspecified: M54.9

## 2020-11-11 HISTORY — DX: Other specified postprocedural states: R11.2

## 2020-11-11 HISTORY — DX: Cervicalgia: M54.2

## 2020-11-11 HISTORY — DX: Unspecified symptoms and signs involving the genitourinary system: R39.9

## 2020-11-11 HISTORY — DX: Diaphragmatic hernia without obstruction or gangrene: K44.9

## 2020-11-11 HISTORY — DX: Rash and other nonspecific skin eruption: R21

## 2020-11-11 HISTORY — DX: Other specified postprocedural states: Z98.890

## 2020-11-11 HISTORY — DX: Other secondary thrombocytopenia: T45.1X5A

## 2020-11-11 HISTORY — DX: Type 2 diabetes mellitus without complications: Z79.4

## 2020-11-11 LAB — CBC WITH DIFFERENTIAL/PLATELET
Abs Immature Granulocytes: 0.06 10*3/uL (ref 0.00–0.07)
Basophils Absolute: 0 10*3/uL (ref 0.0–0.1)
Basophils Relative: 0 %
Eosinophils Absolute: 0.1 10*3/uL (ref 0.0–0.5)
Eosinophils Relative: 1 %
HCT: 39.7 % (ref 39.0–52.0)
Hemoglobin: 12.1 g/dL — ABNORMAL LOW (ref 13.0–17.0)
Immature Granulocytes: 1 %
Lymphocytes Relative: 22 %
Lymphs Abs: 2.4 10*3/uL (ref 0.7–4.0)
MCH: 26.5 pg (ref 26.0–34.0)
MCHC: 30.5 g/dL (ref 30.0–36.0)
MCV: 87.1 fL (ref 80.0–100.0)
Monocytes Absolute: 0.8 10*3/uL (ref 0.1–1.0)
Monocytes Relative: 8 %
Neutro Abs: 7.6 10*3/uL (ref 1.7–7.7)
Neutrophils Relative %: 68 %
Platelets: 189 10*3/uL (ref 150–400)
RBC: 4.56 MIL/uL (ref 4.22–5.81)
RDW: 16.9 % — ABNORMAL HIGH (ref 11.5–15.5)
WBC: 11 10*3/uL — ABNORMAL HIGH (ref 4.0–10.5)
nRBC: 0 % (ref 0.0–0.2)

## 2020-11-11 LAB — GLUCOSE, CAPILLARY: Glucose-Capillary: 161 mg/dL — ABNORMAL HIGH (ref 70–99)

## 2020-11-11 LAB — BASIC METABOLIC PANEL
Anion gap: 9 (ref 5–15)
BUN: 11 mg/dL (ref 6–20)
CO2: 22 mmol/L (ref 22–32)
Calcium: 8.7 mg/dL — ABNORMAL LOW (ref 8.9–10.3)
Chloride: 107 mmol/L (ref 98–111)
Creatinine, Ser: 0.84 mg/dL (ref 0.61–1.24)
GFR, Estimated: >60 mL/min (ref >60)
Glucose, Bld: 220 mg/dL — ABNORMAL HIGH (ref 70–99)
Potassium: 3.6 mmol/L (ref 3.5–5.1)
Sodium: 138 mmol/L (ref 135–145)

## 2020-11-11 SURGERY — OPEN REDUCTION INTERNAL FIXATION (ORIF) FOOT LISFRANC FRACTURE
Anesthesia: General | Site: Foot | Laterality: Left

## 2020-11-11 MED ORDER — LACTATED RINGERS IV SOLN: INTRAVENOUS | Status: DC

## 2020-11-11 MED ORDER — LIDOCAINE HCL (PF) 2 % IJ SOLN
INTRAMUSCULAR | Status: DC | PRN
  Administered 2020-11-11: 40 mg via INTRADERMAL

## 2020-11-11 MED ORDER — MIDAZOLAM HCL 2 MG/2ML IJ SOLN
2.0000 mg | Freq: Once | INTRAMUSCULAR | Status: AC
  Administered 2020-11-11: 2 mg via INTRAVENOUS

## 2020-11-11 MED ORDER — ACETAMINOPHEN 500 MG PO TABS
1000.0000 mg | ORAL_TABLET | Freq: Once | ORAL | Status: AC
  Administered 2020-11-11: 1000 mg via ORAL

## 2020-11-11 MED ORDER — PROPOFOL 10 MG/ML IV BOLUS
INTRAVENOUS | Status: AC
  Filled 2020-11-11: qty 40

## 2020-11-11 MED ORDER — ACETAMINOPHEN 500 MG PO TABS
ORAL_TABLET | ORAL | Status: AC
  Filled 2020-11-11: qty 2

## 2020-11-11 MED ORDER — HYDROMORPHONE HCL 1 MG/ML IJ SOLN: 0.2500 mg | INTRAMUSCULAR | Status: DC | PRN

## 2020-11-11 MED ORDER — CEFAZOLIN SODIUM-DEXTROSE 2-4 GM/100ML-% IV SOLN
INTRAVENOUS | Status: AC
  Filled 2020-11-11: qty 100

## 2020-11-11 MED ORDER — ONDANSETRON HCL 4 MG PO TABS: 4.0000 mg | ORAL_TABLET | Freq: Three times a day (TID) | ORAL | 0 refills | Status: DC | PRN

## 2020-11-11 MED ORDER — FENTANYL CITRATE (PF) 100 MCG/2ML IJ SOLN
INTRAMUSCULAR | Status: AC
  Filled 2020-11-11: qty 2

## 2020-11-11 MED ORDER — CEFAZOLIN SODIUM-DEXTROSE 2-4 GM/100ML-% IV SOLN
2.0000 g | INTRAVENOUS | Status: AC
  Administered 2020-11-11: 2 g via INTRAVENOUS

## 2020-11-11 MED ORDER — LIDOCAINE HCL (PF) 2 % IJ SOLN
INTRAMUSCULAR | Status: AC
  Filled 2020-11-11: qty 5

## 2020-11-11 MED ORDER — MIDAZOLAM HCL 2 MG/2ML IJ SOLN
INTRAMUSCULAR | Status: AC
  Filled 2020-11-11: qty 2

## 2020-11-11 MED ORDER — DEXAMETHASONE SODIUM PHOSPHATE 10 MG/ML IJ SOLN
INTRAMUSCULAR | Status: DC | PRN
  Administered 2020-11-11: 5 mg via INTRAVENOUS

## 2020-11-11 MED ORDER — DEXAMETHASONE SODIUM PHOSPHATE 10 MG/ML IJ SOLN
INTRAMUSCULAR | Status: AC
  Filled 2020-11-11: qty 1

## 2020-11-11 MED ORDER — FENTANYL CITRATE (PF) 100 MCG/2ML IJ SOLN
100.0000 ug | Freq: Once | INTRAMUSCULAR | Status: AC
  Administered 2020-11-11: 100 ug via INTRAVENOUS

## 2020-11-11 MED ORDER — OXYCODONE-ACETAMINOPHEN 5-325 MG PO TABS: 1.0000 | ORAL_TABLET | ORAL | 0 refills | Status: DC | PRN

## 2020-11-11 MED ORDER — ONDANSETRON HCL 4 MG/2ML IJ SOLN
INTRAMUSCULAR | Status: AC
  Filled 2020-11-11: qty 2

## 2020-11-11 MED ORDER — CEPHALEXIN 500 MG PO CAPS: ORAL_CAPSULE | ORAL | 0 refills | Status: DC

## 2020-11-11 MED ORDER — BUPIVACAINE-EPINEPHRINE (PF) 0.5% -1:200000 IJ SOLN
INTRAMUSCULAR | Status: DC | PRN
  Administered 2020-11-11: 30 mL via PERINEURAL
  Administered 2020-11-11: 12 mL via PERINEURAL

## 2020-11-11 MED ORDER — PROPOFOL 10 MG/ML IV BOLUS
INTRAVENOUS | Status: DC | PRN
  Administered 2020-11-11: 250 mg via INTRAVENOUS

## 2020-11-11 MED ORDER — ONDANSETRON HCL 4 MG/2ML IJ SOLN
INTRAMUSCULAR | Status: DC | PRN
  Administered 2020-11-11: 4 mg via INTRAVENOUS

## 2020-11-11 SURGICAL SUPPLY — 75 items
APL PRP STRL LF DISP 70% ISPRP (MISCELLANEOUS)
BANDAGE ESMARK 6X9 LF (GAUZE/BANDAGES/DRESSINGS) ×1 IMPLANT
BLADE MICRO SAGITTAL (BLADE) IMPLANT
BLADE SURG 15 STRL LF DISP TIS (BLADE) ×2 IMPLANT
BLADE SURG 15 STRL SS (BLADE) ×6
BNDG CMPR 9X6 STRL LF SNTH (GAUZE/BANDAGES/DRESSINGS) ×1
BNDG COHESIVE 4X5 TAN STRL (GAUZE/BANDAGES/DRESSINGS) ×3 IMPLANT
BNDG COHESIVE 6X5 TAN STRL LF (GAUZE/BANDAGES/DRESSINGS) ×3 IMPLANT
BNDG ESMARK 6X9 LF (GAUZE/BANDAGES/DRESSINGS) ×3
BUR OVAL CARBIDE 4.0 (BURR) ×2 IMPLANT
CAP PIN PROTECTOR ORTHO WHT (CAP) IMPLANT
CHLORAPREP W/TINT 26 (MISCELLANEOUS) ×1 IMPLANT
COUNTERSINK CANNULATED 5.0 (Miscellaneous) ×3 IMPLANT
COVER BACK TABLE 60X90IN (DRAPES) ×3 IMPLANT
CUFF TOURN SGL QUICK 34 (TOURNIQUET CUFF)
CUFF TRNQT CYL 34X4.125X (TOURNIQUET CUFF) IMPLANT
DECANTER SPIKE VIAL GLASS SM (MISCELLANEOUS) IMPLANT
DRAPE EXTREMITY T 121X128X90 (DISPOSABLE) ×3 IMPLANT
DRAPE OEC MINIVIEW 54X84 (DRAPES) ×3 IMPLANT
DRAPE U-SHAPE 47X51 STRL (DRAPES) ×3 IMPLANT
DRSG PAD ABDOMINAL 8X10 ST (GAUZE/BANDAGES/DRESSINGS) ×3 IMPLANT
ELECT REM PT RETURN 9FT ADLT (ELECTROSURGICAL) ×3
ELECTRODE REM PT RTRN 9FT ADLT (ELECTROSURGICAL) ×1 IMPLANT
GAUZE SPONGE 4X4 12PLY STRL (GAUZE/BANDAGES/DRESSINGS) ×5 IMPLANT
GLOVE SRG 8 PF TXTR STRL LF DI (GLOVE) ×2 IMPLANT
GLOVE SURG ENC MOIS LTX SZ8 (GLOVE) ×5 IMPLANT
GLOVE SURG LTX SZ8 (GLOVE) ×5 IMPLANT
GLOVE SURG UNDER POLY LF SZ8 (GLOVE) ×6
GOWN STRL REUS W/ TWL XL LVL3 (GOWN DISPOSABLE) ×2 IMPLANT
GOWN STRL REUS W/TWL XL LVL3 (GOWN DISPOSABLE) ×6
K-WIRE .054X4 (WIRE) IMPLANT
K-WIRE 1.1 (WIRE) ×3
K-WIRE OLIVE 1.2X65 (WIRE) ×6
KWIRE OLIVE 1.2X65 (WIRE) IMPLANT
NDL HYPO 25X1 1.5 SAFETY (NEEDLE) IMPLANT
NEEDLE HYPO 22GX1.5 SAFETY (NEEDLE) IMPLANT
NEEDLE HYPO 25X1 1.5 SAFETY (NEEDLE) IMPLANT
PACK BASIN DAY SURGERY FS (CUSTOM PROCEDURE TRAY) ×3 IMPLANT
PACK FOAM VITOSS BBTRAUMA 1.2 (Bone Implant) ×2 IMPLANT
PAD CAST 4YDX4 CTTN HI CHSV (CAST SUPPLIES) ×1 IMPLANT
PADDING CAST ABS 4INX4YD NS (CAST SUPPLIES)
PADDING CAST ABS COTTON 4X4 ST (CAST SUPPLIES) IMPLANT
PADDING CAST COTTON 4X4 STRL (CAST SUPPLIES) ×3
PADDING CAST COTTON 6X4 STRL (CAST SUPPLIES) ×3 IMPLANT
PENCIL SMOKE EVACUATOR (MISCELLANEOUS) ×3 IMPLANT
PLATE ACHORAGE UTILITY 5 HOLE (Plate) ×2 IMPLANT
SCREW CNTRSNK CANNULATED 5.0 (Miscellaneous) IMPLANT
SCREW DARTFIRE HEADED 3.5X40 (Screw) ×2 IMPLANT
SCREW LOCK 3.0X14MM (Screw) ×2 IMPLANT
SCREW LOCK 3.5 X 24 (Screw) ×2 IMPLANT
SCREW LOCKING 3.5X22MAX (Screw) ×2 IMPLANT
SCREW NONLOCK 3.0X16 (Screw) ×2 IMPLANT
SCREW NONLOCK 3.0X18 (Screw) ×2 IMPLANT
SHEET MEDIUM DRAPE 40X70 STRL (DRAPES) ×3 IMPLANT
SPLINT FAST PLASTER 5X30 (CAST SUPPLIES) ×40
SPLINT PLASTER CAST FAST 5X30 (CAST SUPPLIES) ×20 IMPLANT
SPONGE T-LAP 18X18 ~~LOC~~+RFID (SPONGE) ×3 IMPLANT
STAPLER VISISTAT (STAPLE) ×2 IMPLANT
STOCKINETTE 6  STRL (DRAPES) ×3
STOCKINETTE 6 STRL (DRAPES) ×1 IMPLANT
SUCTION FRAZIER HANDLE 10FR (MISCELLANEOUS) ×3
SUCTION TUBE FRAZIER 10FR DISP (MISCELLANEOUS) ×1 IMPLANT
SUT ETHILON 3 0 PS 1 (SUTURE) ×3 IMPLANT
SUT MNCRL AB 3-0 PS2 18 (SUTURE) ×3 IMPLANT
SUT MNCRL AB 4-0 PS2 18 (SUTURE) ×2 IMPLANT
SUT VIC AB 0 SH 27 (SUTURE) IMPLANT
SUT VIC AB 2-0 SH 27 (SUTURE)
SUT VIC AB 2-0 SH 27XBRD (SUTURE) IMPLANT
SYR BULB EAR ULCER 3OZ GRN STR (SYRINGE) ×3 IMPLANT
SYR CONTROL 10ML LL (SYRINGE) IMPLANT
TOWEL OR 17X26 10 PK STRL BLUE (TOWEL DISPOSABLE) ×3 IMPLANT
TUBE CONNECTING 20'X1/4 (TUBING) ×1
TUBE CONNECTING 20X1/4 (TUBING) ×2 IMPLANT
UNDERPAD 30X36 HEAVY ABSORB (UNDERPADS AND DIAPERS) ×3 IMPLANT
WIRE FX150X1.1XTROC BLNT (WIRE) IMPLANT

## 2020-11-11 NOTE — Transfer of Care (Signed)
Immediate Anesthesia Transfer of Care Note  Patient: Jeff [name]  Procedure(s) Performed: Procedure(s) (LRB): OPEN REDUCTION INTERNAL FIXATION (ORIF) FOOT LISFRANC FRACTURE (Left)  Patient Location: PACU  Anesthesia Type: General  Level of Consciousness: awake, sedated, patient cooperative and responds to stimulation  Airway & Oxygen Therapy: Patient Spontanous Breathing and Patient connected to [place] and soft FM   Post-op Assessment: Report given to PACU RN, Post -op Vital signs reviewed and stable and Patient moving all extremities  Post vital signs: Reviewed and stable  Complications: No apparent anesthesia complications

## 2020-11-11 NOTE — Anesthesia Procedure Notes (Signed)
Anesthesia Regional Block: Popliteal block   Pre-Anesthetic Checklist: , timeout performed,  Correct Patient, Correct Site, Correct Laterality,  Correct Procedure, Correct Position, site marked,  Risks and benefits discussed,  Pre-op evaluation,  At surgeon's request and post-op pain management  Laterality: Left  Prep: Maximum Sterile Barrier Precautions used, chloraprep       Needles:  Injection technique: Single-shot  Needle Type: Echogenic Stimulator Needle     Needle Length: 9cm  Needle Gauge: 21     Additional Needles:   Procedures:,,,, ultrasound used (permanent image in chart),,    Narrative:  Start time: 11/11/2020 12:35 PM End time: 11/11/2020 12:45 PM Injection made incrementally with aspirations every 5 mL. Anesthesiologist: Roderic Palau, MD  Additional Notes: 2% Lidocaine skin wheel. Adductor canal block with 12cc of 0.5% Bupivicaine w/1:200k epi.

## 2020-11-11 NOTE — Brief Op Note (Signed)
11/11/2020  3:04 PM  PATIENT:  [name]  55 y.o. male  PRE-OPERATIVE DIAGNOSIS:  Lisfranc Dislocation  POST-OPERATIVE DIAGNOSIS:  Lisfranc Dislocation  PROCEDURE:  Procedure(s) with comments: OPEN REDUCTION INTERNAL FIXATION (ORIF) FOOT LISFRANC FRACTURE (Left) - General with Regional Block  SURGEON:  Surgeon(s) and Role:    * Evelina Bucy, DPM - Primary  PHYSICIAN ASSISTANT:   ASSISTANTS: none   ANESTHESIA:   local and MAC  EBL:  5 ml   BLOOD ADMINISTERED:none  DRAINS: none   LOCAL MEDICATIONS USED:  NONE  SPECIMEN:  No Specimen  DISPOSITION OF SPECIMEN:  N/A  COUNTS:  YES  TOURNIQUET:  Yes 250 mmHg x 80 mins  DICTATION: .Dragon Dictation  PLAN OF CARE: Discharge to home after PACU  PATIENT DISPOSITION:  PACU - hemodynamically stable.   Delay start of Pharmacological VTE agent (>24hrs) due to surgical blood loss or risk of bleeding: not applicable

## 2020-11-11 NOTE — Discharge Instructions (Addendum)
After Surgery Instructions   1) If you are recuperating from surgery anywhere other than home, please be sure to leave [place] the number where you can be reached.  2) Go directly home and rest.  3) Keep the operated foot(feet) elevated six inches above the hip when sitting or lying down. This will help control swelling and pain.  4) Support the elevated foot and leg with pillows. DO NOT PLACE PILLOWS UNDER THE KNEE.  5) DO NOT REMOVE or get your bandages WET, unless you were given different instructions by your doctor to do so. This increases the risk of infection.  6) Wear your surgical shoe or surgical boot at all times when you are up on your feet.  7) A limited amount of pain and swelling may occur. The skin may take on a bruised appearance. DO NOT BE ALARMED, THIS IS NORMAL.  8) For slight pain and swelling, apply an ice pack directly over the bandages for 15 minutes only out of each hour of the day. Continue until seen in the office for your first post op visit. DO NOT APPLY ANY FORM OF HEAT TO THE AREA.  9) Have prescriptions filled immediately and take as directed.  10) Drink lots of liquids, water and juice to stay hydrated.  11) CALL IMMEDIATELY IF:  *Bleeding continues until the following day of surgery  *Pain increases and/or does not respond to medication  *Bandages or cast appears to tight  *If your bandage gets wet  *Trip, fall or stump your surgical foot  *If your temperature goes above 101  *If you have ANY questions at all  12) You are expected to be non-weightbearing after your surgery.   If you need to reach the nurse for any reason, please call: [place]: [phone] [place]: [phone] [place]: [phone]    Post Anesthesia Home Care Instructions  Activity: Get plenty of rest for the remainder of the day. A responsible individual must stay with you for 24 hours following the procedure.  For the next 24 hours, DO  NOT: -Drive a car -Paediatric nurse -Drink alcoholic beverages -Take any medication unless instructed by your physician -Make any legal decisions or sign important papers.  Meals: Start with liquid foods such as gelatin or soup. Progress to regular foods as tolerated. Avoid greasy, spicy, heavy foods. If nausea and/or vomiting occur, drink only clear liquids until the nausea and/or vomiting subsides. Call your physician if vomiting continues.  Special Instructions/Symptoms: Your throat may feel dry or sore from the anesthesia or the breathing tube placed in your throat during surgery. If this causes discomfort, gargle with warm salt water. The discomfort should disappear within 24 hours.     Regional Anesthesia Blocks  1. Numbness or the inability to move the "blocked" extremity may last from 3-48 hours after placement. The length of time depends on the medication injected and your individual response to the medication. If the numbness is not going away after 48 hours, call your surgeon.  2. The extremity that is blocked will need to be protected until the numbness is gone and the  Strength has returned. Because you cannot feel it, you will need to take extra care to avoid injury. Because it may be weak, you may have difficulty moving it or using it. You may not know what position it is in without looking at it while the block is in effect.  3. For blocks in the legs and feet, returning to weight bearing and  walking needs to be done carefully. You will need to wait until the numbness is entirely gone and the strength has returned. You should be able to move your leg and foot normally before you try and bear weight or walk. You will need someone to be with you when you first try to ensure you do not fall and possibly risk injury.  4. Bruising and tenderness at the needle site are common side effects and will resolve in a few days.  5. Persistent numbness or new problems with movement should be  communicated to the surgeon or the [place] [phone] [place] [phone]).

## 2020-11-11 NOTE — Progress Notes (Signed)
Assisted Dr. Oren Bracket with left ultrasound guided popliteal adductor canal block. Side rails up, monitors on throughout procedure. See vital signs in flow sheet. Tolerated Procedure well.

## 2020-11-11 NOTE — Anesthesia Preprocedure Evaluation (Addendum)
Anesthesia Evaluation  Patient identified by MRN, date of birth, ID band Patient awake    Reviewed: Allergy & Precautions, H&P , NPO status , Patient's Chart, lab work & pertinent test results  History of Anesthesia Complications (+) PONV  Airway Mallampati: II  TM Distance: >3 FB Neck ROM: Full    Dental no notable dental hx. (+) Teeth Intact, Dental Advisory Given   Pulmonary neg pulmonary ROS,    Pulmonary exam normal breath sounds clear to auscultation       Cardiovascular hypertension, Pt. on medications + Past MI and + Peripheral Vascular Disease   Rhythm:Regular Rate:Normal     Neuro/Psych Seizures -, Well Controlled,  Anxiety Depression    GI/Hepatic Neg liver ROS, hiatal hernia, GERD  Medicated,  Endo/Other  diabetes, Insulin DependentHypothyroidism   Renal/GU negative Renal ROS  negative genitourinary   Musculoskeletal  (+) Arthritis , Osteoarthritis,    Abdominal   Peds  Hematology negative hematology ROS (+)   Anesthesia Other Findings   Reproductive/Obstetrics negative OB ROS                            Anesthesia Physical Anesthesia Plan  ASA: 3  Anesthesia Plan: General   Post-op Pain Management:  Regional for Post-op pain   Induction: Intravenous  PONV Risk Score and Plan: 4 or greater and Ondansetron, Dexamethasone and Midazolam  Airway Management Planned: LMA  Additional Equipment:   Intra-op Plan:   Post-operative Plan: Extubation in OR  Informed Consent: I have reviewed the patients History and Physical, chart, labs and discussed the procedure including the risks, benefits and alternatives for the proposed anesthesia with the patient or authorized representative who has indicated his/her understanding and acceptance.     Dental advisory given  Plan Discussed with: CRNA  Anesthesia Plan Comments:         Anesthesia Quick Evaluation

## 2020-11-11 NOTE — Anesthesia Procedure Notes (Signed)
Procedure Name: LMA Insertion Date/Time: 11/11/2020 1:14 PM Performed by: Myna Bright, CRNA Pre-anesthesia Checklist: Patient identified, Emergency Drugs available, Suction available and Patient being monitored Patient Re-evaluated:Patient Re-evaluated prior to induction Oxygen Delivery Method: Circle system utilized Preoxygenation: Pre-oxygenation with 100% oxygen Induction Type: IV induction Ventilation: Mask ventilation without difficulty LMA: LMA inserted LMA Size: 4.0 Number of attempts: 1 Placement Confirmation: positive ETCO2 and breath sounds checked- equal and bilateral Tube secured with: Tape Dental Injury: Teeth and Oropharynx as per pre-operative assessment

## 2020-11-11 NOTE — Progress Notes (Signed)
Orthopedic Tech Progress Note Patient Details:  Jeff [name] 04/09/1966 [id number]  Patient ID: [name], male   DOB: [birth year], 55 y.o.   MRN: [id number]  [name] 11/11/2020, 3:43 PM Cam walker applied to left leg in [facility] pacu

## 2020-11-11 NOTE — Interval H&P Note (Signed)
History and Physical Interval Note:  11/11/2020 12:58 PM  Jeff [name]  has presented today for surgery, with the diagnosis of Lisfranc Dislocation.  The various methods of treatment have been discussed with the patient and family. After consideration of risks, benefits and other options for treatment, the patient has consented to  Procedure(s) with comments: OPEN REDUCTION INTERNAL FIXATION (ORIF) FOOT LISFRANC FRACTURE (Left) - General with Regional Block as a surgical intervention.  The patient's history has been reviewed, patient examined, no change in status, stable for surgery.  I have reviewed the patient's chart and labs.  Questions were answered to the patient's satisfaction.     Evelina Bucy

## 2020-11-11 NOTE — Op Note (Addendum)
Patient Name: Jeff [name] DOB: [birth year]  MRN: [id number]   Date of Surgery: 11/11/2020  Surgeon: Dr. Hardie Pulley, DPM Assistants: none  Pre-operative Diagnosis:  Left Lisfranc dislocation Post-operative Diagnosis:  * No Diagnosis Codes entered * Procedures:  1) Repair of midfoot disclocation  2) Left  2nd tarsometatarsal fusion Pathology/Specimens: * No specimens in log * Anesthesia: General Hemostasis:  Total Tourniquet Time Documented: Ankle (Left) - 93 minutes Total: Ankle (Left) - 93 minutes Estimated Blood Loss: 10 ml Materials:  Implant Name Type Inv. Item Serial No. Manufacturer Lot No. LRB No. Used Action  LOG [id number] - [employer] BIOMET/DEPUY ACE LOCKING SMALL FRAGMENT SET - 1          PACK FOAM VITOSS BBTRAUMA 1.2 - [id number] Bone Implant PACK FOAM VITOSS BBTRAUMA 1.2  [employer] [id number] Left 1 Implanted  COUNTERSINK CANNULATED 5.0 - [id number] Miscellaneous COUNTERSINK CANNULATED 5.0  [employer] STERILIZED ON TRAY Left 1 Implanted  SCREW DARTFIRE HEADED 3.5X40 - [id number] Screw SCREW DARTFIRE HEADED 3.5X40  [employer] STERILIZED ON TRAY Left 1 Implanted  PLATE ACHORAGE UTILITY 5 HOLE - [id number] Plate PLATE ACHORAGE UTILITY 5 HOLE  [employer] STERILIZED ON TRAY Left 1 Implanted  SCREW LOCK 3.5 X 24 - [id number] Screw SCREW LOCK 3.5 X 24  [employer] STERILIZED ON TRAY Left 1 Implanted  SCREW LOCKING 3.5X22MAX - [id number] Screw SCREW LOCKING 3.5X22MAX  [employer] STERILIZED ON TRAY Left 1 Implanted and Explanted  SCREW NONLOCK 3.0X18 - [id number] Screw SCREW NONLOCK 3.0X18  [employer] STERILIZED ON TRAY Left 1 Implanted  SCREW LOCK 3.0X14MM - [id number] Screw SCREW LOCK 3.0X14MM  [employer] STERILIZED ON TRAY Left 1 Implanted  SCREW NONLOCK 3.0X16 - [id number] Screw SCREW NONLOCK 3.0X16  [employer] STERILIZED ON TRAY Left 1 Implanted   Medications: None Complications: None  Indications for  Procedure:  This is a 55 y.o. male who previously experienced a midfoot dislocation. He underwent operative reduction and temporary pinning. He presents for fixation of the fractures today. All risks, benefits, and alternatives were discussed. No guarantees given.   Procedure in Detail: Patient was identified in pre-operative holding area. Formal consent was signed and the left  lower extremity was marked. Patient was brought back to the operating room. Anesthesia was induced.  Timeout was taken.  The pins were pulled from the previous surgery as well as the sutures and staples were removed prior to prepping.  The extremity was prepped and draped in the usual sterile fashion. Timeout was taken again to confirm patient name, laterality, and procedure prior to incision.   Attention was then directed to the left foot.  An incision was made over the medial aspect of the's proximal interspace of the second and third metatarsals.  Dissection was continued down through the skin and subcutaneous tissue with care to avoid all vital neurovascular structures all bleeders were cauterized with electrocautery.  Dissection was continued down to the level of the joint capsule.  The joint capsule was linearly incised.  The second metatarsal base was slightly displaced and cartilage damage was noted on both sides of the joint.  Tarsometatarsal fusion was indicated.  The joints were then prepped and all remaining cartilage was denuded with a rongeur and rotary burr to bleeding bone.   Reduction forceps were then used to reduce the second tarsometatarsal joint.  A K wire with the path of the Lisfranc screw was then passed from the medial form to this base of the second  metatarsal.  Positioning was confirmed under fluoroscopy.  A 3.5 screw was then placed over the wire and the second tarsal did reduce to near-anatomic position. Vitoss bone substitue was placed in the arthrodesis site to assist in fusion. The joint was then  compressed and a plate was then tacked to the bone.  Fluoroscopy was used to confirm positioning.  The plate was then sequentially filled with a mixture of locking and nonlocking 3-0 screws.  Final fluoroscopic imaging showed good reduction of the arthrodesis site with stable fixation construct.  The third tarsometatarsal joint was examined and was found to be acceptable position without demonstrate cartilage. At this point the wound was then copiously irrigated.  The wound was then closed in layers with 3-0 Vicryl, 3-0 Monocryl and 4-0 nylon and skin staples. The foot was then dressed with xeroform, 4x4, kerlix, and ACE bandage. Patient tolerated the procedure well.   Disposition: Following a period of post-operative monitoring, patient will be transferred home.

## 2020-11-11 NOTE — Telephone Encounter (Signed)
[facility] is calling to inform physician that patient has already received a prescription from another pharmacy for a chronic pain meds from [facility], Percocet 10-325 mg,30 day supply filled on 11/03/20. Should she continue to dispense the current prescription sent of 5-325 mg? Please advise.

## 2020-11-12 ENCOUNTER — Telehealth: Payer: Self-pay | Admitting: Podiatry

## 2020-11-12 LAB — HEMOGLOBIN A1C
Hgb A1c MFr Bld: 9.5 % — ABNORMAL HIGH (ref 4.8–5.6)
Mean Plasma Glucose: 226 mg/dL

## 2020-11-12 MED ORDER — OXYCODONE HCL 5 MG PO TABS: 5.0000 mg | ORAL_TABLET | ORAL | 0 refills | Status: DC | PRN

## 2020-11-12 NOTE — Anesthesia Postprocedure Evaluation (Signed)
Anesthesia Post Note  Patient: Jeff [name]  Procedure(s) Performed: OPEN REDUCTION INTERNAL FIXATION (ORIF) FOOT LISFRANC FRACTURE (Left: Foot)     Patient location during evaluation: PACU Anesthesia Type: General and Regional Level of consciousness: awake and alert Pain management: pain level controlled Vital Signs Assessment: post-procedure vital signs reviewed and stable Respiratory status: spontaneous breathing, nonlabored ventilation and respiratory function stable Cardiovascular status: blood pressure returned to baseline and stable Postop Assessment: no apparent nausea or vomiting Anesthetic complications: no   No notable events documented.  Last Vitals:  Vitals:   11/11/20 1545 11/11/20 1630  BP: (!) 149/95 (!) 146/92  Pulse: 99 98  Resp: 18   Temp:  36.6 C  SpO2: 96% 96%    Last Pain:  Vitals:   11/11/20 1630  TempSrc:   PainSc: 0-No pain                 Chayton Murata,W. EDMOND

## 2020-11-12 NOTE — Telephone Encounter (Signed)
Pharmacy called back in regards to medication problem. They would like for you to reach out at your earliest convenience. Please advise.

## 2020-11-13 ENCOUNTER — Encounter (HOSPITAL_BASED_OUTPATIENT_CLINIC_OR_DEPARTMENT_OTHER): Payer: Self-pay | Admitting: Podiatry

## 2020-11-13 ENCOUNTER — Telehealth: Payer: Self-pay | Admitting: *Deleted

## 2020-11-13 NOTE — Telephone Encounter (Signed)
Message complete

## 2020-11-17 ENCOUNTER — Other Ambulatory Visit: Payer: Self-pay

## 2020-11-17 ENCOUNTER — Ambulatory Visit (INDEPENDENT_AMBULATORY_CARE_PROVIDER_SITE_OTHER): Payer: Medicaid Other

## 2020-11-17 ENCOUNTER — Ambulatory Visit (INDEPENDENT_AMBULATORY_CARE_PROVIDER_SITE_OTHER): Payer: Medicaid Other | Admitting: Podiatry

## 2020-11-17 DIAGNOSIS — Z9889 Other specified postprocedural states: Secondary | ICD-10-CM

## 2020-11-17 MED ORDER — OXYCODONE HCL 5 MG PO TABS: 5.0000 mg | ORAL_TABLET | ORAL | 0 refills | Status: DC | PRN

## 2020-11-17 NOTE — Progress Notes (Signed)
  Subjective:  Patient ID: Jeff [name], male    DOB: 06-02-1965,  MRN: [id number]  Chief Complaint  Patient presents with   Routine Post Op    POV #1 DOS 11/11/2020 LT FOOT ORIF OF MIDFOOT FRACTURES W/JOINT FUSION AS INDICATED Denies f/c/n/v. Manageable pain lvls. Denies any other concerns.     DOS: 11/11/20 Procedure: Left foot 2nd TMT fusion, repair of midfoot dislocation   55 y.o. male presents with the above complaint. History confirmed with patient.   Objective:  Physical Exam: tenderness at the surgical site, local edema noted, and calf supple, nontender. Incision: healing well, no significant drainage, no dehiscence, no significant erythema  No images are attached to the encounter.  Radiographs: X-ray of the left foot: Consistent with post-op state good alignment noted, hardware intact.  Assessment:   1. Status post surgery    Plan:  Patient was evaluated and treated and all questions answered.  Post-operative State -XR reviewed with patient -Ok to start showering at this time. Advised they cannot soak. -Dressing applied consisting of sterile gauze, kerlix, and ACE bandage -NWB with knee scooter -Pain medication refilled -XRs needed at follow-up: none   Return in about 1 week (around 11/24/2020).

## 2020-11-22 ENCOUNTER — Other Ambulatory Visit: Payer: Self-pay | Admitting: Cardiovascular Disease

## 2020-11-24 ENCOUNTER — Inpatient Hospital Stay (HOSPITAL_BASED_OUTPATIENT_CLINIC_OR_DEPARTMENT_OTHER): Payer: Medicaid Other | Admitting: Oncology

## 2020-11-24 ENCOUNTER — Other Ambulatory Visit: Payer: Self-pay

## 2020-11-24 ENCOUNTER — Inpatient Hospital Stay: Payer: Medicaid Other

## 2020-11-24 ENCOUNTER — Inpatient Hospital Stay: Payer: Medicaid Other | Attending: Oncology

## 2020-11-24 ENCOUNTER — Other Ambulatory Visit: Payer: Self-pay | Admitting: Oncology

## 2020-11-24 VITALS — BP 140/84 | HR 108 | Temp 97.8°F | Resp 20 | Ht 70.0 in | Wt 244.2 lb

## 2020-11-24 VITALS — HR 99

## 2020-11-24 DIAGNOSIS — C7802 Secondary malignant neoplasm of left lung: Secondary | ICD-10-CM | POA: Diagnosis not present

## 2020-11-24 DIAGNOSIS — Z5111 Encounter for antineoplastic chemotherapy: Secondary | ICD-10-CM | POA: Insufficient documentation

## 2020-11-24 DIAGNOSIS — C189 Malignant neoplasm of colon, unspecified: Secondary | ICD-10-CM

## 2020-11-24 DIAGNOSIS — C787 Secondary malignant neoplasm of liver and intrahepatic bile duct: Secondary | ICD-10-CM | POA: Insufficient documentation

## 2020-11-24 DIAGNOSIS — C7801 Secondary malignant neoplasm of right lung: Secondary | ICD-10-CM | POA: Diagnosis not present

## 2020-11-24 DIAGNOSIS — Z5112 Encounter for antineoplastic immunotherapy: Secondary | ICD-10-CM | POA: Diagnosis not present

## 2020-11-24 DIAGNOSIS — C187 Malignant neoplasm of sigmoid colon: Secondary | ICD-10-CM | POA: Insufficient documentation

## 2020-11-24 LAB — MAGNESIUM: Magnesium: 1.7 mg/dL (ref 1.7–2.4)

## 2020-11-24 LAB — CMP (CANCER CENTER ONLY)
ALT: 50 U/L — ABNORMAL HIGH (ref 0–44)
AST: 52 U/L — ABNORMAL HIGH (ref 15–41)
Albumin: 4.2 g/dL (ref 3.5–5.0)
Alkaline Phosphatase: 104 U/L (ref 38–126)
Anion gap: 13 (ref 5–15)
BUN: 9 mg/dL (ref 6–20)
CO2: 23 mmol/L (ref 22–32)
Calcium: 9.4 mg/dL (ref 8.9–10.3)
Chloride: 101 mmol/L (ref 98–111)
Creatinine: 1.01 mg/dL (ref 0.61–1.24)
GFR, Estimated: >60 mL/min (ref >60)
Glucose, Bld: 234 mg/dL — ABNORMAL HIGH (ref 70–99)
Potassium: 4.1 mmol/L (ref 3.5–5.1)
Sodium: 137 mmol/L (ref 135–145)
Total Bilirubin: 0.5 mg/dL (ref 0.3–1.2)
Total Protein: 7.5 g/dL (ref 6.5–8.1)

## 2020-11-24 LAB — CBC WITH DIFFERENTIAL (CANCER CENTER ONLY)
Abs Immature Granulocytes: 0.04 10*3/uL (ref 0.00–0.07)
Basophils Absolute: 0 10*3/uL (ref 0.0–0.1)
Basophils Relative: 0 %
Eosinophils Absolute: 0.3 10*3/uL (ref 0.0–0.5)
Eosinophils Relative: 2 %
HCT: 42.8 % (ref 39.0–52.0)
Hemoglobin: 12.8 g/dL — ABNORMAL LOW (ref 13.0–17.0)
Immature Granulocytes: 0 %
Lymphocytes Relative: 26 %
Lymphs Abs: 2.9 10*3/uL (ref 0.7–4.0)
MCH: 26 pg (ref 26.0–34.0)
MCHC: 29.9 g/dL — ABNORMAL LOW (ref 30.0–36.0)
MCV: 87 fL (ref 80.0–100.0)
Monocytes Absolute: 0.7 10*3/uL (ref 0.1–1.0)
Monocytes Relative: 7 %
Neutro Abs: 7.2 10*3/uL (ref 1.7–7.7)
Neutrophils Relative %: 65 %
Platelet Count: 218 10*3/uL (ref 150–400)
RBC: 4.92 MIL/uL (ref 4.22–5.81)
RDW: 16.3 % — ABNORMAL HIGH (ref 11.5–15.5)
WBC Count: 11.2 10*3/uL — ABNORMAL HIGH (ref 4.0–10.5)
nRBC: 0 % (ref 0.0–0.2)

## 2020-11-24 LAB — CEA (ACCESS): CEA (CHCC): 6.6 ng/mL — ABNORMAL HIGH (ref 0.00–5.00)

## 2020-11-24 MED ORDER — PALONOSETRON HCL INJECTION 0.25 MG/5ML
0.2500 mg | Freq: Once | INTRAVENOUS | Status: AC
  Administered 2020-11-24: 0.25 mg via INTRAVENOUS
  Filled 2020-11-24: qty 5

## 2020-11-24 MED ORDER — SODIUM CHLORIDE 0.9% FLUSH
10.0000 mL | INTRAVENOUS | Status: DC | PRN
  Administered 2020-11-24: 10 mL
  Filled 2020-11-24: qty 10

## 2020-11-24 MED ORDER — SODIUM CHLORIDE 0.9 % IV SOLN
Freq: Once | INTRAVENOUS | Status: AC
  Filled 2020-11-24: qty 250

## 2020-11-24 MED ORDER — HEPARIN SOD (PORK) LOCK FLUSH 100 UNIT/ML IV SOLN
500.0000 [IU] | Freq: Once | INTRAVENOUS | Status: AC | PRN
  Administered 2020-11-24: 500 [IU]
  Filled 2020-11-24: qty 5

## 2020-11-24 MED ORDER — DEXAMETHASONE SODIUM PHOSPHATE 10 MG/ML IJ SOLN
5.0000 mg | Freq: Once | INTRAMUSCULAR | Status: AC
  Administered 2020-11-24: 5 mg via INTRAVENOUS
  Filled 2020-11-24: qty 1

## 2020-11-24 MED ORDER — PANITUMUMAB CHEMO INJECTION 100 MG/5ML
6.0000 mg/kg | Freq: Once | INTRAVENOUS | Status: AC
  Administered 2020-11-24: 700 mg via INTRAVENOUS
  Filled 2020-11-24: qty 20

## 2020-11-24 MED ORDER — SODIUM CHLORIDE 0.9 % IV SOLN
150.0000 mg | Freq: Once | INTRAVENOUS | Status: AC
  Administered 2020-11-24: 150 mg via INTRAVENOUS
  Filled 2020-11-24: qty 5

## 2020-11-24 MED ORDER — ATROPINE SULFATE 1 MG/ML IJ SOLN
0.4000 mg | Freq: Once | INTRAMUSCULAR | Status: AC
  Administered 2020-11-24: 0.4 mg via INTRAVENOUS
  Filled 2020-11-24: qty 1

## 2020-11-24 MED ORDER — SODIUM CHLORIDE 0.9 % IV SOLN
180.0000 mg/m2 | Freq: Once | INTRAVENOUS | Status: AC
  Administered 2020-11-24: 420 mg via INTRAVENOUS
  Filled 2020-11-24: qty 21

## 2020-11-24 NOTE — Progress Notes (Signed)
[facility] OFF[facility] PROGRESS NOTE   Diagnosis: Colon cancer  INTERVAL HISTORY:   Jeff [name] completed another cycle of irinotecan/panitumumab on 10/08/2020.  He fractured his left foot and is undergone surgery.  He underwent repair of a midfoot dislocation and left second tarsometatarsal fusion on 11/11/2020.  He is recovering from surgery.  The wound has healed.  He remains in a ace wrap and boot apparatus.  He otherwise feels well.  The skin rash has improved.  No seizures.  Objective:  Vital signs in last 24 hours:  Blood pressure 140/84, pulse (!) 108, temperature 97.8 F (36.6 C), temperature source Oral, resp. rate 20, height 5' 10" (1.778 m), weight 244 lb 3.2 oz (110.8 kg), SpO2 98 %.    HEENT: No thrush or ulcers Resp: Lungs clear bilaterally Cardio: Regular rate and rhythm GI: No hepatomegaly, nontender Vascular: The right calf is larger than the left side, no erythema or tenderness.  No lower leg edema.  Skin: Mild acne type rash over the face, no skin breakdown of the hands or fingernails.  The left foot incision appears healed with staples in place.  Portacath/PICC-without erythema  Lab Results:  Lab Results  Component Value Date   WBC 11.2 (H) 11/24/2020   HGB 12.8 (L) 11/24/2020   HCT 42.8 11/24/2020   MCV 87.0 11/24/2020   PLT 218 11/24/2020   NEUTROABS 7.2 11/24/2020    CMP  Lab Results  Component Value Date   NA 138 11/11/2020   K 3.6 11/11/2020   CL 107 11/11/2020   CO2 22 11/11/2020   GLUCOSE 220 (H) 11/11/2020   BUN 11 11/11/2020   CREATININE 0.84 11/11/2020   CALCIUM 8.7 (L) 11/11/2020   PROT 6.8 10/22/2020   ALBUMIN 3.8 10/22/2020   AST 39 10/22/2020   ALT 33 10/22/2020   ALKPHOS 95 10/22/2020   BILITOT 0.5 10/22/2020   GFRNONAA >60 11/11/2020   GFRAA >60 01/30/2020    Lab Results  Component Value Date   CEA1 3.19 09/10/2020   CEA 4.57 09/24/2020     Medications: I have reviewed the patient's current  medications.   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, biopsy confirmed adenocarcinoma         CTs chest, abdomen, and pelvis on 12/11/2015-indeterminate tiny pulmonary nodules, multiple liver metastases, small nodes in the sigmoid mesocolon Laparoscopic sigmoid colectomy, liver biopsy, Port-A-Cath placement 01/14/2016 Pathology sigmoid colon resection- colonic adenocarcinoma, 5.3 cm extending into pericolonic connective tissue, positive lymph-vascular invasion, positive perineural invasion, negative margins, metastatic carcinoma in 9 of 28 lymph nodes Pathology liver biopsy-metastatic colorectal adenocarcinoma MSI stable; mismatch repair protein normal APC alteration, K RAS wild-type, no BRAF mutation Cycle 1 FOLFOX 02/02/2016 Cycle 2 FOLFOX 02/15/2016 Cycle 3 FOLFOX 02/29/2016 Cycle 4 FOLFOX 03/14/2016 Cycle 5 FOLFOX 03/28/2016 Cycle 6 FOLFOX 04/11/2016 (oxaliplatin held secondary to thrombocytopenia) 04/21/2016 restaging CTs-difficulty evaluating liver lesions due to hepatic steatosis. Stable right adrenal nodule. No adenopathy or local recurrence near the rectosigmoid anastomotic site. Cycle 7 FOLFOX 04/25/2016 MRI liver 05/02/2016-partial improvement in hepatic metastases Cycle 8 FOLFOX 05/10/2016 Cycle 9 FOLFOX 05/23/2016 (oxaliplatin held due to thrombocytopenia) Cycle 10 FOLFOX 06/06/2016 (oxaliplatin dose reduced due to thrombocytopenia) Cycle 11 FOLFOX 06/27/2016 (oxaliplatin held due to neuropathy) Cycle 12 FOLFOX 07/11/2016 (oxaliplatin held) Initiation of maintenance Xeloda 7 days on/7 days off 07/27/2016 MRI liver 11/18/2016-decrease in hepatic metastatic disease. No new or progressive disease identified within the abdomen. Continuation of Xeloda 7 days  on/7 days off MRI liver 04/27/2017-previous liver lesions not identified, no new lesions, no lymphadenopathy Xeloda continued 7 days on/7 days off MRI  liver 12/04/2017 - no evidence of metastatic disease, hepatic steatosis Xeloda continued 7 days on/7 days off MRI liver 07/15/2018- no evidence of metastatic disease.  Stable severe hepatic steatosis. Xeloda continued 7 days on/7 days off MRI liver 03/16/2019-hepatic steatosis, no liver mass, focal area of intrahepatic biliary dilatation in segments 2 and 3 of the left lobe-increased Xeloda continued 7 days on/7 days off MRI abdomen 08/19/2019-no findings to suggest liver metastases.  Bilateral lung nodules measuring up to 2.8 cm, progressive and more conspicuous than on previous exam CT chest 08/29/2019-multiple pulmonary metastases, new from 04/21/2016 Cycle 1 FOLFIRI/bevacizumab 09/09/2019 Cycle 2 FOLFIRI/bevacizumab 09/26/2019  Cycle 3 FOLFIRI/bevacizumab 10/10/2019 Cycle 4 FOLFIRI/bevacizumab 10/24/2019 Cycle 5 FOLFIRI/bevacizumab 11/07/2019 CT chest 11/14/2019-decreased size of lung nodules, no new lesions, hepatic steatosis Cycle 6 FOLFIRI/bevacizumab 11/21/2019 Cycle 7 FOLFIRI/bevacizumab 12/05/2019 Cycle 8 FOLFIRI/bevacizumab 12/19/2019 Cycle 9 FOLFIRI/bevacizumab 01/02/2020 Cycle 10 FOLFIRI/bevacizumab 01/16/2020 CT chest 01/29/2020-stable bilateral pulmonary metastases.  No new or progressive metastatic disease in the chest. Cycle 11 FOLFIRI/bevacizumab 01/30/2020 Cycle 12 FOLFIRI/bevacizumab 02/19/2020 Cycle 13 FOLFIRI/bevacizumab 03/12/2020 Cycle 14 FOLFIRI/bevacizumab 04/02/2020 Cycle 15 FOLFIRI/bevacizumab 04/23/2020 Cycle 16 FOLFIRI/bevacizumab 05/21/2020 CT chest 06/09/2020-mild progression pulmonary metastasis.  Some lesions have increased in size while others are similar. Cycle 1 irinotecan/Panitumumab 06/18/2020 Cycle 2 irinotecan/Panitumumab 07/02/2020 Cycle 3 irinotecan/panitumumab 07/16/2020 Cycle 4 irinotecan/Panitumumab 07/30/2020 Cycle 5 irinotecan/Panitumumab 08/13/2020, Emend added Cycle 6 irinotecan/Panitumumab 08/27/2020 CT chest 09/08/2020-decreased size of pulmonary nodules, no progressive  disease Cycle 7 irinotecan/panitumumab 09/02/2020 Cycle 8 irinotecan/panitumumab 09/24/2020 Cycle 9 irinotecan/Panitumumab 10/08/2020 10/22/2020 treatment held due to left foot fracture, need for surgery Cycle 10 irinotecan/panitumumab 11/24/2020   2.   Rectal bleeding and constipation secondary to #1   3.   History of peripheral vascular disease, status post left lower extremity vascular bypass surgery in April 2017   4.   History of nephrolithiasis   5.   History of Graves' disease treated with radioactive iodine   6.   Anxiety/depression   7.   Hypertension   8.   Hospitalization 01/19/2016 with wound dehiscence status post secondary suture closure of abdominal wall   9.   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 Hyperglycemia 06/20/2016-diagnosed with diabetes, maintained on insulin   11.  Positive COVID test 12/13/2018   12. Hospitalized with seizure activity/DKA. Now on Keppra, insulin adjusted. 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  Left foot fracture-surgical repair 11/11/2020    Disposition: Jeff [name] has undergone surgery for repair of a left foot fracture.  The wound appears to be healed.  He is scheduled for follow-up with podiatry later this week.  He will resume irinotecan/panitumumab today.  He will return for an office visit and chemotherapy in 2 weeks.  We will plan for a restaging CT in the next 1-2 months.  Betsy Coder, MD  11/24/2020  9:27 AM

## 2020-11-24 NOTE — Patient Instructions (Signed)
Implanted Port Home Guide An implanted port is a device that is placed under the skin. It is usually placed in the chest. The device can be used to give IV medicine, to take blood, or for dialysis. You may have an implanted port if: You need IV medicine that would be irritating to the small veins in your hands or arms. You need IV medicines, such as antibiotics, for a long period of time. You need IV nutrition for a long period of time. You need dialysis. When you have a port, your health care provider can choose to use the port instead of veins in your arms for these procedures. You may have fewer limitations when using a port than you would if you used other types of long-term IVs, and you will likely be able to return to normal activities afteryour incision heals. An implanted port has two main parts: Reservoir. The reservoir is the part where a needle is inserted to give medicines or draw blood. The reservoir is round. After it is placed, it appears as a small, raised area under your skin. Catheter. The catheter is a thin, flexible tube that connects the reservoir to a vein. Medicine that is inserted into the reservoir goes into the catheter and then into the vein. How is my port accessed? To access your port: A numbing cream may be placed on the skin over the port site. Your health care provider will put on a mask and sterile gloves. The skin over your port will be cleaned carefully with a germ-killing soap and allowed to dry. Your health care provider will gently pinch the port and insert a needle into it. Your health care provider will check for a blood return to make sure the port is in the vein and is not clogged. If your port needs to remain accessed to get medicine continuously (constant infusion), your health care provider will place a clear bandage (dressing) over the needle site. The dressing and needle will need to be changed every week, or as told by your health care provider. What  is flushing? Flushing helps keep the port from getting clogged. Follow instructions from your health care provider about how and when to flush the port. Ports are usually flushed with saline solution or a medicine called heparin. The need for flushing will depend on how the port is used: If the port is only used from time to time to give medicines or draw blood, the port may need to be flushed: Before and after medicines have been given. Before and after blood has been drawn. As part of routine maintenance. Flushing may be recommended every 4-6 weeks. If a constant infusion is running, the port may not need to be flushed. Throw away any syringes in a disposal container that is meant for sharp items (sharps container). You can buy a sharps container from a pharmacy, or you can make one by using an empty hard plastic bottle with a cover. How long will my port stay implanted? The port can stay in for as long as your health care provider thinks it is needed. When it is time for the port to come out, a surgery will be done to remove it. The surgery will be similar to the procedure that was done to putthe port in. Follow these instructions at home:  Flush your port as told by your health care provider. If you need an infusion over several days, follow instructions from your health care provider about how to take   care of your port site. Make sure you: Wash your hands with soap and water before you change your dressing. If soap and water are not available, use alcohol-based hand sanitizer. Change your dressing as told by your health care provider. Place any used dressings or infusion bags into a plastic bag. Throw that bag in the trash. Keep the dressing that covers the needle clean and dry. Do not get it wet. Do not use scissors or sharp objects near the tube. Keep the tube clamped, unless it is being used. Check your port site every day for signs of infection. Check for: Redness, swelling, or  pain. Fluid or blood. Pus or a bad smell. Protect the skin around the port site. Avoid wearing bra straps that rub or irritate the site. Protect the skin around your port from seat belts. Place a soft pad over your chest if needed. Bathe or shower as told by your health care provider. The site may get wet as long as you are not actively receiving an infusion. Return to your normal activities as told by your health care provider. Ask your health care provider what activities are safe for you. Carry a medical alert card or wear a medical alert bracelet at all times. This will let health care providers know that you have an implanted port in case of an emergency. Get help right away if: You have redness, swelling, or pain at the port site. You have fluid or blood coming from your port site. You have pus or a bad smell coming from the port site. You have a fever. Summary Implanted ports are usually placed in the chest for long-term IV access. Follow instructions from your health care provider about flushing the port and changing bandages (dressings). Take care of the area around your port by avoiding clothing that puts pressure on the area, and by watching for signs of infection. Protect the skin around your port from seat belts. Place a soft pad over your chest if needed. Get help right away if you have a fever or you have redness, swelling, pain, drainage, or a bad smell at the port site. This information is not intended to replace advice given to you by your health care provider. Make sure you discuss any questions you have with your healthcare provider. Document Revised: 09/16/2019 Document Reviewed: 09/16/2019 [employer] Patient Education  2022 [employer]  

## 2020-11-24 NOTE — Patient Instructions (Signed)
[facility]  Discharge Instructions: Thank you for choosing [facility] to provide your oncology and hematology care.   If you have a lab appointment with the [facility], please go directly to the [facility] and check in at the registration area.   Wear comfortable clothing and clothing appropriate for easy access to any Portacath or PICC line.   We strive to give you quality time with your provider. You may need to reschedule your appointment if you arrive late (15 or more minutes).  Arriving late affects you and other patients whose appointments are after yours.  Also, if you miss three or more appointments without notifying the office, you may be dismissed from the clinic at the provider's discretion.      For prescription refill requests, have your pharmacy contact our office and allow 72 hours for refills to be completed.    Today you received the following chemotherapy and/or immunotherapy agents vectibix and irinotecan. Return as scheduled.  Please call the clinic if you have any questions or concerns.       To help prevent nausea and vomiting after your treatment, we encourage you to take your nausea medication as directed.  BELOW ARE SYMPTOMS THAT SHOULD BE REPORTED IMMEDIATELY: *FEVER GREATER THAN 100.4 F (38 C) OR HIGHER *CHILLS OR SWEATING *NAUSEA AND VOMITING THAT IS NOT CONTROLLED WITH YOUR NAUSEA MEDICATION *UNUSUAL SHORTNESS OF BREATH *UNUSUAL BRUISING OR BLEEDING *URINARY PROBLEMS (pain or burning when urinating, or frequent urination) *BOWEL PROBLEMS (unusual diarrhea, constipation, pain near the anus) TENDERNESS IN MOUTH AND THROAT WITH OR WITHOUT PRESENCE OF ULCERS (sore throat, sores in mouth, or a toothache) UNUSUAL RASH, SWELLING OR PAIN  UNUSUAL VAGINAL DISCHARGE OR ITCHING   Items with * indicate a potential emergency and should be followed up as soon as possible or go to the Emergency Department if any problems  should occur.  Please show the CHEMOTHERAPY ALERT CARD or IMMUNOTHERAPY ALERT CARD at check-in to the Emergency Department and triage nurse.  Should you have questions after your visit or need to cancel or reschedule your appointment, please contact [facility]  Dept: [phone]  and follow the prompts.  Office hours are 8:00 a.m. to 4:30 p.m. Monday - Friday. Please note that voicemails left after 4:00 p.m. may not be returned until the following business day.  We are closed weekends and major holidays. You have access to a nurse at all times for urgent questions. Please call the main number to the clinic Dept: [phone] and follow the prompts.   For any non-urgent questions, you may also contact your provider using MyChart. We now offer e-Visits for anyone 69 and older to request care online for non-urgent symptoms. For details visit [email or web address].   Also download the MyChart app! Go to the app store, search "MyChart", open the app, select [facility], and log in with your MyChart username and password.  Due to Covid, a mask is required upon entering the hospital/clinic. If you do not have a mask, one will be given to you upon arrival. For doctor visits, patients may have 1 support person aged 46 or older with them. For treatment visits, patients cannot have anyone with them due to current Covid guidelines and our immunocompromised population.   Irinotecan injection What is this medicine? IRINOTECAN (ir in oh TEE kan ) is a chemotherapy drug. It is used to treat colon and rectal cancer. This medicine may be used  for other purposes; ask your health care provider or pharmacist if you have questions. COMMON BRAND NAME(S): Camptosar What should I tell my health care provider before I take this medicine? They need to know if you have any of these conditions: dehydration diarrhea infection (especially a virus infection such as chickenpox, cold sores, or  herpes) liver disease low blood counts, like low white cell, platelet, or red cell counts low levels of calcium, magnesium, or potassium in the blood recent or ongoing radiation therapy an unusual or allergic reaction to irinotecan, other medicines, foods, dyes, or preservatives pregnant or trying to get pregnant breast-feeding How should I use this medicine? This drug is given as an infusion into a vein. It is administered in a hospital or clinic by a specially trained health care professional. Talk to your pediatrician regarding the use of this medicine in children. Special care may be needed. Overdosage: If you think you have taken too much of this medicine contact a poison control center or emergency room at once. NOTE: This medicine is only for you. Do not share this medicine with others. What if I miss a dose? It is important not to miss your dose. Call your doctor or health care professional if you are unable to keep an appointment. What may interact with this medicine? Do not take this medicine with any of the following medications: cobicistat itraconazole This medicine may interact with the following medications: antiviral medicines for HIV or AIDS certain antibiotics like rifampin or rifabutin certain medicines for fungal infections like ketoconazole, posaconazole, and voriconazole certain medicines for seizures like carbamazepine, phenobarbital, phenotoin clarithromycin gemfibrozil nefazodone [facility]. John's Wort This list may not describe all possible interactions. Give your health care provider a list of all the medicines, herbs, non-prescription drugs, or dietary supplements you use. Also tell them if you smoke, drink alcohol, or use illegal drugs. Some items may interact with your medicine. What should I watch for while using this medicine? Your condition will be monitored carefully while you are receiving this medicine. You will need important blood work done while you are  taking this medicine. This drug may make you feel generally unwell. This is not uncommon, as chemotherapy can affect healthy cells as well as cancer cells. Report any side effects. Continue your course of treatment even though you feel ill unless your doctor tells you to stop. In some cases, you may be given additional medicines to help with side effects. Follow all directions for their use. You may get drowsy or dizzy. Do not drive, use machinery, or do anything that needs mental alertness until you know how this medicine affects you. Do not stand or sit up quickly, especially if you are an older patient. This reduces the risk of dizzy or fainting spells. Call your health care professional for advice if you get a fever, chills, or sore throat, or other symptoms of a cold or flu. Do not treat yourself. This medicine decreases your body's ability to fight infections. Try to avoid being around people who are sick. Avoid taking products that contain aspirin, acetaminophen, ibuprofen, naproxen, or ketoprofen unless instructed by your doctor. These medicines may hide a fever. This medicine may increase your risk to bruise or bleed. Call your doctor or health care professional if you notice any unusual bleeding. Be careful brushing and flossing your teeth or using a toothpick because you may get an infection or bleed more easily. If you have any dental work done, tell your dentist you are  receiving this medicine. Do not become pregnant while taking this medicine or for 6 months after stopping it. Women should inform their health care professional if they wish to become pregnant or think they might be pregnant. Men should not father a child while taking this medicine and for 3 months after stopping it. There is potential for serious side effects to an unborn child. Talk to your health care professional for more information. Do not breast-feed an infant while taking this medicine or for 7 days after stopping  it. This medicine has caused ovarian failure in some women. This medicine may make it more difficult to get pregnant. Talk to your health care professional if you are concerned about your fertility. This medicine has caused decreased sperm counts in some men. This may make it more difficult to father a child. Talk to your health care professional if you are concerned about your fertility. What side effects may I notice from receiving this medicine? Side effects that you should report to your doctor or health care professional as soon as possible: allergic reactions like skin rash, itching or hives, swelling of the face, lips, or tongue chest pain diarrhea flushing, runny nose, sweating during infusion low blood counts - this medicine may decrease the number of white blood cells, red blood cells and platelets. You may be at increased risk for infections and bleeding. nausea, vomiting pain, swelling, warmth in the leg signs of decreased platelets or bleeding - bruising, pinpoint red spots on the skin, black, tarry stools, blood in the urine signs of infection - fever or chills, cough, sore throat, pain or difficulty passing urine signs of decreased red blood cells - unusually weak or tired, fainting spells, lightheadedness Side effects that usually do not require medical attention (report to your doctor or health care professional if they continue or are bothersome): constipation hair loss headache loss of appetite mouth sores stomach pain This list may not describe all possible side effects. Call your doctor for medical advice about side effects. You may report side effects to FDA at 1-800-FDA-1088. Where should I keep my medicine? This drug is given in a hospital or clinic and will not be stored at home. NOTE: This sheet is a summary. It may not cover all possible information. If you have questions about this medicine, talk to your doctor, pharmacist, or health care provider.  2021  Elsevier/Gold Standard (2019-04-02 17:46:13)  Irinotecan injection What is this medicine? IRINOTECAN (ir in oh TEE kan ) is a chemotherapy drug. It is used to treat colon and rectal cancer. This medicine may be used for other purposes; ask your health care provider or pharmacist if you have questions. COMMON BRAND NAME(S): Camptosar What should I tell my health care provider before I take this medicine? They need to know if you have any of these conditions: dehydration diarrhea infection (especially a virus infection such as chickenpox, cold sores, or herpes) liver disease low blood counts, like low white cell, platelet, or red cell counts low levels of calcium, magnesium, or potassium in the blood recent or ongoing radiation therapy an unusual or allergic reaction to irinotecan, other medicines, foods, dyes, or preservatives pregnant or trying to get pregnant breast-feeding How should I use this medicine? This drug is given as an infusion into a vein. It is administered in a hospital or clinic by a specially trained health care professional. Talk to your pediatrician regarding the use of this medicine in children. Special care may be needed. Overdosage: If  you think you have taken too much of this medicine contact a poison control center or emergency room at once. NOTE: This medicine is only for you. Do not share this medicine with others. What if I miss a dose? It is important not to miss your dose. Call your doctor or health care professional if you are unable to keep an appointment. What may interact with this medicine? Do not take this medicine with any of the following medications: cobicistat itraconazole This medicine may interact with the following medications: antiviral medicines for HIV or AIDS certain antibiotics like rifampin or rifabutin certain medicines for fungal infections like ketoconazole, posaconazole, and voriconazole certain medicines for seizures like  carbamazepine, phenobarbital, phenotoin clarithromycin gemfibrozil nefazodone St. John's Wort This list may not describe all possible interactions. Give your health care provider a list of all the medicines, herbs, non-prescription drugs, or dietary supplements you use. Also tell them if you smoke, drink alcohol, or use illegal drugs. Some items may interact with your medicine. What should I watch for while using this medicine? Your condition will be monitored carefully while you are receiving this medicine. You will need important blood work done while you are taking this medicine. This drug may make you feel generally unwell. This is not uncommon, as chemotherapy can affect healthy cells as well as cancer cells. Report any side effects. Continue your course of treatment even though you feel ill unless your doctor tells you to stop. In some cases, you may be given additional medicines to help with side effects. Follow all directions for their use. You may get drowsy or dizzy. Do not drive, use machinery, or do anything that needs mental alertness until you know how this medicine affects you. Do not stand or sit up quickly, especially if you are an older patient. This reduces the risk of dizzy or fainting spells. Call your health care professional for advice if you get a fever, chills, or sore throat, or other symptoms of a cold or flu. Do not treat yourself. This medicine decreases your body's ability to fight infections. Try to avoid being around people who are sick. Avoid taking products that contain aspirin, acetaminophen, ibuprofen, naproxen, or ketoprofen unless instructed by your doctor. These medicines may hide a fever. This medicine may increase your risk to bruise or bleed. Call your doctor or health care professional if you notice any unusual bleeding. Be careful brushing and flossing your teeth or using a toothpick because you may get an infection or bleed more easily. If you have any  dental work done, tell your dentist you are receiving this medicine. Do not become pregnant while taking this medicine or for 6 months after stopping it. Women should inform their health care professional if they wish to become pregnant or think they might be pregnant. Men should not father a child while taking this medicine and for 3 months after stopping it. There is potential for serious side effects to an unborn child. Talk to your health care professional for more information. Do not breast-feed an infant while taking this medicine or for 7 days after stopping it. This medicine has caused ovarian failure in some women. This medicine may make it more difficult to get pregnant. Talk to your health care professional if you are concerned about your fertility. This medicine has caused decreased sperm counts in some men. This may make it more difficult to father a child. Talk to your health care professional if you are concerned about your fertility.  What side effects may I notice from receiving this medicine? Side effects that you should report to your doctor or health care professional as soon as possible: allergic reactions like skin rash, itching or hives, swelling of the face, lips, or tongue chest pain diarrhea flushing, runny nose, sweating during infusion low blood counts - this medicine may decrease the number of white blood cells, red blood cells and platelets. You may be at increased risk for infections and bleeding. nausea, vomiting pain, swelling, warmth in the leg signs of decreased platelets or bleeding - bruising, pinpoint red spots on the skin, black, tarry stools, blood in the urine signs of infection - fever or chills, cough, sore throat, pain or difficulty passing urine signs of decreased red blood cells - unusually weak or tired, fainting spells, lightheadedness Side effects that usually do not require medical attention (report to your doctor or health care professional if they  continue or are bothersome): constipation hair loss headache loss of appetite mouth sores stomach pain This list may not describe all possible side effects. Call your doctor for medical advice about side effects. You may report side effects to FDA at 1-800-FDA-1088. Where should I keep my medicine? This drug is given in a hospital or clinic and will not be stored at home. NOTE: This sheet is a summary. It may not cover all possible information. If you have questions about this medicine, talk to your doctor, pharmacist, or health care provider.  2021 Elsevier/Gold Standard (2019-04-02 17:46:13)

## 2020-11-26 ENCOUNTER — Ambulatory Visit (INDEPENDENT_AMBULATORY_CARE_PROVIDER_SITE_OTHER): Payer: Medicaid Other | Admitting: Podiatry

## 2020-11-26 ENCOUNTER — Encounter: Payer: Self-pay | Admitting: Podiatry

## 2020-11-26 ENCOUNTER — Other Ambulatory Visit: Payer: Self-pay

## 2020-11-26 DIAGNOSIS — Z9889 Other specified postprocedural states: Secondary | ICD-10-CM

## 2020-11-26 NOTE — Progress Notes (Signed)
  Subjective:  Patient ID: SUYASH AMORY, male    DOB: June 20, 1965,  MRN: [id number]  Chief Complaint  Patient presents with   Routine Post Op    I am doing pretty good on the left foot    DOS: 11/11/20 Procedure: Left foot 2nd TMT fusion, repair of midfoot dislocation   55 y.o. male presents with the above complaint. History confirmed with patient.   Objective:  Physical Exam: tenderness at the surgical site, local edema noted, and calf supple, nontender. Incision: healing well, no significant drainage, no dehiscence, no significant erythema  Assessment:   1. Status post surgery     Plan:  Patient was evaluated and treated and all questions answered.  Post-operative State -Sutures removed -Ok to start showering at this time. Advised they cannot soak. -NWB with knee scooter -Pain medication refilled -XRs needed at follow-up: none  Return for Post-Op (No XRs). For staple removal.

## 2020-12-01 ENCOUNTER — Other Ambulatory Visit: Payer: Self-pay

## 2020-12-01 ENCOUNTER — Ambulatory Visit (INDEPENDENT_AMBULATORY_CARE_PROVIDER_SITE_OTHER): Payer: Medicaid Other | Admitting: Podiatry

## 2020-12-01 DIAGNOSIS — S93325D Dislocation of tarsometatarsal joint of left foot, subsequent encounter: Secondary | ICD-10-CM

## 2020-12-01 DIAGNOSIS — Z91199 Patient's noncompliance with other medical treatment and regimen due to unspecified reason: Secondary | ICD-10-CM

## 2020-12-01 DIAGNOSIS — Z5329 Procedure and treatment not carried out because of patient's decision for other reasons: Secondary | ICD-10-CM

## 2020-12-01 DIAGNOSIS — Z9889 Other specified postprocedural states: Secondary | ICD-10-CM

## 2020-12-01 MED ORDER — OXYCODONE-ACETAMINOPHEN 5-325 MG PO TABS: 1.0000 | ORAL_TABLET | ORAL | 0 refills | Status: DC | PRN

## 2020-12-01 NOTE — Progress Notes (Signed)
  Subjective:  Patient ID: Jeff [name], male    DOB: [birth year],  MRN: [id number]  Chief Complaint  Patient presents with   Routine Post Op    POV #2 DOS 11/11/2020 LT FOOT ORIF OF MIDFOOT FRACTURES W/JOINT FUSION AS INDICATED. Pt states he is doing well with surgery. No NV, fever or chills. No questions or concerns.    DOS: 11/11/20 Procedure: Left foot 2nd TMT fusion, repair of midfoot dislocation   55 y.o. male presents with the above complaint. History confirmed with patient.   Objective:  Physical Exam: tenderness at the surgical site, local edema noted, and calf supple, nontender. Incision: healing well, no significant drainage, no dehiscence, no significant erythema  Assessment:   1. Status post surgery   2. Lisfranc dislocation, left, subsequent encounter   3. No-show for appointment    Plan:  Patient was evaluated and treated and all questions answered.  Post-operative State -Staples removed  -NWB with knee scooter -Pain medication refilled -XRs needed at follow-up: 3 view Foot -Plan for progression of WB if indicated.  Return in about 2 weeks (around 12/15/2020) for Post-Op (with XRs). For staple removal.

## 2020-12-03 ENCOUNTER — Ambulatory Visit: Payer: Medicaid Other | Admitting: Podiatry

## 2020-12-05 ENCOUNTER — Other Ambulatory Visit: Payer: Self-pay | Admitting: Oncology

## 2020-12-07 ENCOUNTER — Ambulatory Visit: Payer: Medicaid Other | Admitting: Neurology

## 2020-12-08 ENCOUNTER — Inpatient Hospital Stay: Payer: Medicaid Other

## 2020-12-08 ENCOUNTER — Encounter: Payer: Self-pay | Admitting: Nurse Practitioner

## 2020-12-08 ENCOUNTER — Other Ambulatory Visit: Payer: Self-pay

## 2020-12-08 ENCOUNTER — Inpatient Hospital Stay (HOSPITAL_BASED_OUTPATIENT_CLINIC_OR_DEPARTMENT_OTHER): Payer: Medicaid Other | Admitting: Nurse Practitioner

## 2020-12-08 VITALS — BP 138/64 | HR 89 | Temp 97.4°F | Resp 18 | Wt 241.4 lb

## 2020-12-08 VITALS — BP 133/83 | HR 90 | Resp 18

## 2020-12-08 DIAGNOSIS — C189 Malignant neoplasm of colon, unspecified: Secondary | ICD-10-CM

## 2020-12-08 DIAGNOSIS — C787 Secondary malignant neoplasm of liver and intrahepatic bile duct: Secondary | ICD-10-CM

## 2020-12-08 DIAGNOSIS — C187 Malignant neoplasm of sigmoid colon: Secondary | ICD-10-CM

## 2020-12-08 DIAGNOSIS — Z5112 Encounter for antineoplastic immunotherapy: Secondary | ICD-10-CM | POA: Diagnosis not present

## 2020-12-08 LAB — CBC WITH DIFFERENTIAL (CANCER CENTER ONLY)
Abs Immature Granulocytes: 0.04 10*3/uL (ref 0.00–0.07)
Basophils Absolute: 0 10*3/uL (ref 0.0–0.1)
Basophils Relative: 0 %
Eosinophils Absolute: 0.1 10*3/uL (ref 0.0–0.5)
Eosinophils Relative: 2 %
HCT: 43.6 % (ref 39.0–52.0)
Hemoglobin: 13.2 g/dL (ref 13.0–17.0)
Immature Granulocytes: 1 %
Lymphocytes Relative: 37 %
Lymphs Abs: 3.1 10*3/uL (ref 0.7–4.0)
MCH: 26.2 pg (ref 26.0–34.0)
MCHC: 30.3 g/dL (ref 30.0–36.0)
MCV: 86.5 fL (ref 80.0–100.0)
Monocytes Absolute: 0.5 10*3/uL (ref 0.1–1.0)
Monocytes Relative: 6 %
Neutro Abs: 4.6 10*3/uL (ref 1.7–7.7)
Neutrophils Relative %: 54 %
Platelet Count: 189 10*3/uL (ref 150–400)
RBC: 5.04 MIL/uL (ref 4.22–5.81)
RDW: 16 % — ABNORMAL HIGH (ref 11.5–15.5)
WBC Count: 8.4 10*3/uL (ref 4.0–10.5)
nRBC: 0 % (ref 0.0–0.2)

## 2020-12-08 LAB — CMP (CANCER CENTER ONLY)
ALT: 51 U/L — ABNORMAL HIGH (ref 0–44)
AST: 47 U/L — ABNORMAL HIGH (ref 15–41)
Albumin: 4 g/dL (ref 3.5–5.0)
Alkaline Phosphatase: 101 U/L (ref 38–126)
Anion gap: 12 (ref 5–15)
BUN: 8 mg/dL (ref 6–20)
CO2: 22 mmol/L (ref 22–32)
Calcium: 8.7 mg/dL — ABNORMAL LOW (ref 8.9–10.3)
Chloride: 104 mmol/L (ref 98–111)
Creatinine: 1 mg/dL (ref 0.61–1.24)
GFR, Estimated: >60 mL/min (ref >60)
Glucose, Bld: 252 mg/dL — ABNORMAL HIGH (ref 70–99)
Potassium: 3.4 mmol/L — ABNORMAL LOW (ref 3.5–5.1)
Sodium: 138 mmol/L (ref 135–145)
Total Bilirubin: 0.4 mg/dL (ref 0.3–1.2)
Total Protein: 7.5 g/dL (ref 6.5–8.1)

## 2020-12-08 LAB — CEA (ACCESS): CEA (CHCC): 5.03 ng/mL — ABNORMAL HIGH (ref 0.00–5.00)

## 2020-12-08 LAB — MAGNESIUM: Magnesium: 1.5 mg/dL — ABNORMAL LOW (ref 1.7–2.4)

## 2020-12-08 MED ORDER — SODIUM CHLORIDE 0.9 % IV SOLN
180.0000 mg/m2 | Freq: Once | INTRAVENOUS | Status: AC
  Administered 2020-12-08: 420 mg via INTRAVENOUS
  Filled 2020-12-08: qty 6

## 2020-12-08 MED ORDER — ATROPINE SULFATE 1 MG/ML IJ SOLN
0.4000 mg | Freq: Once | INTRAMUSCULAR | Status: AC
  Administered 2020-12-08: 0.4 mg via INTRAVENOUS
  Filled 2020-12-08: qty 1

## 2020-12-08 MED ORDER — DEXAMETHASONE SODIUM PHOSPHATE 10 MG/ML IJ SOLN
5.0000 mg | Freq: Once | INTRAMUSCULAR | Status: AC
  Administered 2020-12-08: 5 mg via INTRAVENOUS
  Filled 2020-12-08: qty 1

## 2020-12-08 MED ORDER — SODIUM CHLORIDE 0.9 % IV SOLN
6.0000 mg/kg | Freq: Once | INTRAVENOUS | Status: AC
  Administered 2020-12-08: 700 mg via INTRAVENOUS
  Filled 2020-12-08: qty 20

## 2020-12-08 MED ORDER — SODIUM CHLORIDE 0.9 % IV SOLN
150.0000 mg | Freq: Once | INTRAVENOUS | Status: AC
  Administered 2020-12-08: 150 mg via INTRAVENOUS
  Filled 2020-12-08: qty 5

## 2020-12-08 MED ORDER — HEPARIN SOD (PORK) LOCK FLUSH 100 UNIT/ML IV SOLN
500.0000 [IU] | Freq: Once | INTRAVENOUS | Status: AC | PRN
  Administered 2020-12-08: 500 [IU]
  Filled 2020-12-08: qty 5

## 2020-12-08 MED ORDER — SODIUM CHLORIDE 0.9 % IV SOLN
Freq: Once | INTRAVENOUS | Status: AC
  Filled 2020-12-08: qty 250

## 2020-12-08 MED ORDER — SODIUM CHLORIDE 0.9% FLUSH
10.0000 mL | INTRAVENOUS | Status: DC | PRN
  Administered 2020-12-08: 10 mL
  Filled 2020-12-08: qty 10

## 2020-12-08 MED ORDER — PALONOSETRON HCL INJECTION 0.25 MG/5ML
0.2500 mg | Freq: Once | INTRAVENOUS | Status: AC
  Administered 2020-12-08: 0.25 mg via INTRAVENOUS
  Filled 2020-12-08: qty 5

## 2020-12-08 NOTE — Patient Instructions (Addendum)
Jeff [name]  Discharge Instructions: Thank you for choosing [facility] to provide your oncology and hematology care.   If you have a lab appointment with the [facility], please go directly to the [facility] and check in at the registration area.   Wear comfortable clothing and clothing appropriate for easy access to any Portacath or PICC line.   We strive to give you quality time with your provider. You may need to reschedule your appointment if you arrive late (15 or more minutes).  Arriving late affects you and other patients whose appointments are after yours.  Also, if you miss three or more appointments without notifying the office, you may be dismissed from the clinic at the provider's discretion.      For prescription refill requests, have your pharmacy contact our office and allow 72 hours for refills to be completed.    Today you received the following chemotherapy and/or immunotherapy agents panitumumab, irinotecan To help prevent nausea and vomiting after your treatment, we encourage you to take your nausea medication as directed.  BELOW ARE SYMPTOMS THAT SHOULD BE REPORTED IMMEDIATELY: *FEVER GREATER THAN 100.4 F (38 C) OR HIGHER *CHILLS OR SWEATING *NAUSEA AND VOMITING THAT IS NOT CONTROLLED WITH YOUR NAUSEA MEDICATION *UNUSUAL SHORTNESS OF BREATH *UNUSUAL BRUISING OR BLEEDING *URINARY PROBLEMS (pain or burning when urinating, or frequent urination) *BOWEL PROBLEMS (unusual diarrhea, constipation, pain near the anus) TENDERNESS IN MOUTH AND THROAT WITH OR WITHOUT PRESENCE OF ULCERS (sore throat, sores in mouth, or a toothache) UNUSUAL RASH, SWELLING OR PAIN  UNUSUAL VAGINAL DISCHARGE OR ITCHING   Items with * indicate a potential emergency and should be followed up as soon as possible or go to the Emergency Department if any problems should occur.  Please show the CHEMOTHERAPY ALERT CARD or IMMUNOTHERAPY ALERT CARD at check-in  to the Emergency Department and triage nurse.  Should you have questions after your visit or need to cancel or reschedule your appointment, please contact [facility]  Dept: [phone]  and follow the prompts.  Office hours are 8:00 a.m. to 4:30 p.m. Monday - Friday. Please note that voicemails left after 4:00 p.m. may not be returned until the following business day.  We are closed weekends and major holidays. You have access to a nurse at all times for urgent questions. Please call the main number to the clinic Dept: [phone] and follow the prompts.   For any non-urgent questions, you may also contact your provider using MyChart. We now offer e-Visits for anyone 47 and older to request care online for non-urgent symptoms. For details visit [email or web address].   Also download the MyChart app! Go to the app store, search "MyChart", open the app, select [facility], and log in with your MyChart username and password.  Due to Covid, a mask is required upon entering the hospital/clinic. If you do not have a mask, one will be given to you upon arrival. For doctor visits, patients may have 1 support person aged 75 or older with them. For treatment visits, patients cannot have anyone with them due to current Covid guidelines and our immunocompromised population.   Panitumumab Solution for Injection What is this medication? PANITUMUMAB (pan i TOOM ue mab) is a monoclonal antibody. It is used to treatcolorectal cancer. This medicine may be used for other purposes; ask your health care provider orpharmacist if you have questions. COMMON BRAND NAME(S): Vectibix What should I tell my care team  before I take this medication? They need to know if you have any of these conditions: eye disease, vision problems low levels of calcium, magnesium, or potassium in the blood lung or breathing disease, like asthma skin conditions or sensitivity an unusual or allergic reaction to  panitumumab, other medicines, foods, dyes, or preservatives pregnant or trying to get pregnant breast-feeding How should I use this medication? This drug is given as an infusion into a vein. It is administered in a hospitalor clinic by a specially trained health care professional. Talk to your pediatrician regarding the use of this medicine in children.Special care may be needed. Overdosage: If you think you have taken too much of this medicine contact apoison control center or emergency room at once. NOTE: This medicine is only for you. Do not share this medicine with others. What if I miss a dose? It is important not to miss your dose. Call your doctor or health careprofessional if you are unable to keep an appointment. What may interact with this medication? Do not take this medicine with any of the following medications: bevacizumab This list may not describe all possible interactions. Give your health care provider a list of all the medicines, herbs, non-prescription drugs, or dietary supplements you use. Also tell them if you smoke, drink alcohol, or use illegaldrugs. Some items may interact with your medicine. What should I watch for while using this medication? Visit your doctor for checks on your progress. This drug may make you feel generally unwell. This is not uncommon, as chemotherapy can affect healthy cells as well as cancer cells. Report any side effects. Continue your course oftreatment even though you feel ill unless your doctor tells you to stop. This medicine can make you more sensitive to the sun. Keep out of the sun while receiving this medicine and for 2 months after the last dose. If you cannot avoid being in the sun, wear protective clothing and use sunscreen. Do not usesun lamps or tanning beds/booths. In some cases, you may be given additional medicines to help with side effects.Follow all directions for their use. Call your doctor or health care professional for advice  if you get a fever, chills or sore throat, or other symptoms of a cold or flu. Do not treat yourself. This drug decreases your body's ability to fight infections. Try toavoid being around people who are sick. Avoid taking products that contain aspirin, acetaminophen, ibuprofen, naproxen, or ketoprofen unless instructed by your doctor. These medicines may hide afever. Do not become pregnant while taking this medicine and for 2 months after the last dose. Women should inform their doctor if they wish to become pregnant or think they might be pregnant. There is a potential for serious side effects to an unborn child. Talk to your health care professional or pharmacist for more information. Do not breast-feed an infant while taking this medicine or for 11month after the last dose. What side effects may I notice from receiving this medication? Side effects that you should report to your doctor or health care professionalas soon as possible: allergic reactions like skin rash, itching or hives, swelling of the face, lips, or tongue breathing problems changes in vision eye pain fast, irregular heartbeat fever, chills mouth sores red spots on the skin redness, blistering, peeling or loosening of the skin, including inside the mouth signs and symptoms of kidney injury like trouble passing urine or change in the amount of urine signs and symptoms of low blood pressure like dizziness; feeling  faint or lightheaded, falls; unusually weak or tired signs of low calcium like fast heartbeat, muscle cramps or muscle pain; pain, tingling, numbness in the hands or feet; seizures signs and symptoms of low magnesium like muscle cramps, pain, or weakness; tremors; seizures; or fast, irregular heartbeat signs and symptoms of low potassium like muscle cramps or muscle pain; chest pain; dizziness; feeling faint or lightheaded, falls; palpitations; breathing problems; or fast, irregular heartbeat swelling of the ankles,  feet, hands Side effects that usually do not require medical attention (report to yourdoctor or health care professional if they continue or are bothersome): changes in skin like acne, cracks, skin dryness diarrhea eyelash growth headache mouth sores nail changes nausea, vomiting This list may not describe all possible side effects. Call your doctor for medical advice about side effects. You may report side effects to FDA at1-800-FDA-1088. Where should I keep my medication? This drug is given in a hospital or clinic and will not be stored at home. NOTE: This sheet is a summary. It may not cover all possible information. If you have questions about this medicine, talk to your doctor, pharmacist, orhealth care provider.  2022 Elsevier/Gold Standard (2015-11-20 16:45:04)  Irinotecan injection What is this medication? IRINOTECAN (ir in oh TEE kan ) is a chemotherapy drug. It is used to treatcolon and rectal cancer. This medicine may be used for other purposes; ask your health care provider orpharmacist if you have questions. COMMON BRAND NAME(S): Camptosar What should I tell my care team before I take this medication? They need to know if you have any of these conditions: dehydration diarrhea infection (especially a virus infection such as chickenpox, cold sores, or herpes) liver disease low blood counts, like low white cell, platelet, or red cell counts low levels of calcium, magnesium, or potassium in the blood recent or ongoing radiation therapy an unusual or allergic reaction to irinotecan, other medicines, foods, dyes, or preservatives pregnant or trying to get pregnant breast-feeding How should I use this medication? This drug is given as an infusion into a vein. It is administered in a hospitalor clinic by a specially trained health care professional. Talk to your pediatrician regarding the use of this medicine in children.Special care may be needed. Overdosage: If you think  you have taken too much of this medicine contact apoison control center or emergency room at once. NOTE: This medicine is only for you. Do not share this medicine with others. What if I miss a dose? It is important not to miss your dose. Call your doctor or health careprofessional if you are unable to keep an appointment. What may interact with this medication? Do not take this medicine with any of the following medications: cobicistat itraconazole This medicine may interact with the following medications: antiviral medicines for HIV or AIDS certain antibiotics like rifampin or rifabutin certain medicines for fungal infections like ketoconazole, posaconazole, and voriconazole certain medicines for seizures like carbamazepine, phenobarbital, phenotoin clarithromycin gemfibrozil nefazodone [facility]. John's Wort This list may not describe all possible interactions. Give your health care provider a list of all the medicines, herbs, non-prescription drugs, or dietary supplements you use. Also tell them if you smoke, drink alcohol, or use illegaldrugs. Some items may interact with your medicine. What should I watch for while using this medication? Your condition will be monitored carefully while you are receiving this medicine. You will need important blood work done while you are taking thismedicine. This drug may make you feel generally unwell. This is not uncommon,  as chemotherapy can affect healthy cells as well as cancer cells. Report any side effects. Continue your course of treatment even though you feel ill unless yourdoctor tells you to stop. In some cases, you may be given additional medicines to help with side effects.Follow all directions for their use. You may get drowsy or dizzy. Do not drive, use machinery, or do anything that needs mental alertness until you know how this medicine affects you. Do not stand or sit up quickly, especially if you are an older patient. This reducesthe risk of  dizzy or fainting spells. Call your health care professional for advice if you get a fever, chills, or sore throat, or other symptoms of a cold or flu. Do not treat yourself. This medicine decreases your body's ability to fight infections. Try to avoid beingaround people who are sick. Avoid taking products that contain aspirin, acetaminophen, ibuprofen, naproxen, or ketoprofen unless instructed by your doctor. These medicines may hide afever. This medicine may increase your risk to bruise or bleed. Call your doctor orhealth care professional if you notice any unusual bleeding. Be careful brushing and flossing your teeth or using a toothpick because you may get an infection or bleed more easily. If you have any dental work done,tell your dentist you are receiving this medicine. Do not become pregnant while taking this medicine or for 6 months after stopping it. Women should inform their health care professional if they wish to become pregnant or think they might be pregnant. Men should not father a child while taking this medicine and for 3 months after stopping it. There is potential for serious side effects to an unborn child. Talk to your health careprofessional for more information. Do not breast-feed an infant while taking this medicine or for 7 days afterstopping it. This medicine has caused ovarian failure in some women. This medicine may make it more difficult to get pregnant. Talk to your health care professional if [place] concerned about your fertility. This medicine has caused decreased sperm counts in some men. This may make it more difficult to father a child. Talk to your health care professional if [place] concerned about your fertility. What side effects may I notice from receiving this medication? Side effects that you should report to your doctor or health care professionalas soon as possible: allergic reactions like skin rash, itching or hives, swelling of the face, lips, or tongue chest  pain diarrhea flushing, runny nose, sweating during infusion low blood counts - this medicine may decrease the number of white blood cells, red blood cells and platelets. You may be at increased risk for infections and bleeding. nausea, vomiting pain, swelling, warmth in the leg signs of decreased platelets or bleeding - bruising, pinpoint red spots on the skin, black, tarry stools, blood in the urine signs of infection - fever or chills, cough, sore throat, pain or difficulty passing urine signs of decreased red blood cells - unusually weak or tired, fainting spells, lightheadedness Side effects that usually do not require medical attention (report to yourdoctor or health care professional if they continue or are bothersome): constipation hair loss headache loss of appetite mouth sores stomach pain This list may not describe all possible side effects. Call your doctor for medical advice about side effects. You may report side effects to FDA at1-800-FDA-1088. Where should I keep my medication? This drug is given in a hospital or clinic and will not be stored at home. NOTE: This sheet is a summary. It may not cover all  possible information. If you have questions about this medicine, talk to your doctor, pharmacist, orhealth care provider.  2022 Elsevier/Gold Standard (2019-04-02 17:46:13)

## 2020-12-08 NOTE — Progress Notes (Signed)
[facility] OFFICE PROGRESS NOTE   Diagnosis: Colon cancer  INTERVAL HISTORY:   [name] returns as scheduled.  He completed a cycle of irinotecan/Panitumumab 11/24/2020.  He had mild nausea following chemotherapy.  He vomited 1 time.  No mouth sores.  No diarrhea.  Skin rash is stable.  No seizure activity.  Objective:  Vital signs in last 24 hours:  Blood pressure 138/64, pulse 89, temperature (!) 97.4 F (36.3 C), temperature source Temporal, resp. rate 18, weight 241 lb 6.4 oz (109.5 kg), SpO2 98 %.    HEENT: No thrush or ulcers. Resp: Lungs clear bilaterally. Cardio: Regular rate and rhythm. GI: Abdomen soft and nontender.  No hepatomegaly. Vascular: No right leg edema.  Walking boot on the left. Neuro: Alert and oriented. Skin: Acne type rash upper anterior chest. Port-A-Cath without erythema.   Lab Results:  Lab Results  Component Value Date   WBC 8.4 12/08/2020   HGB 13.2 12/08/2020   HCT 43.6 12/08/2020   MCV 86.5 12/08/2020   PLT 189 12/08/2020   NEUTROABS 4.6 12/08/2020    Imaging:  No results found.  Medications: I have reviewed the patient's current medications.  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, biopsy confirmed adenocarcinoma         CTs chest, abdomen, and pelvis on 12/11/2015-indeterminate tiny pulmonary nodules, multiple liver metastases, small nodes in the sigmoid mesocolon Laparoscopic sigmoid colectomy, liver biopsy, Port-A-Cath placement 01/14/2016 Pathology sigmoid colon resection- colonic adenocarcinoma, 5.3 cm extending into pericolonic connective tissue, positive lymph-vascular invasion, positive perineural invasion, negative margins, metastatic carcinoma in 9 of 28 lymph nodes Pathology liver biopsy-metastatic colorectal adenocarcinoma MSI stable; mismatch repair protein normal APC alteration, K RAS wild-type, no BRAF mutation Cycle 1 FOLFOX  02/02/2016 Cycle 2 FOLFOX 02/15/2016 Cycle 3 FOLFOX 02/29/2016 Cycle 4 FOLFOX 03/14/2016 Cycle 5 FOLFOX 03/28/2016 Cycle 6 FOLFOX 04/11/2016 (oxaliplatin held secondary to thrombocytopenia) 04/21/2016 restaging CTs-difficulty evaluating liver lesions due to hepatic steatosis. Stable right adrenal nodule. No adenopathy or local recurrence near the rectosigmoid anastomotic site. Cycle 7 FOLFOX 04/25/2016 MRI liver 05/02/2016-partial improvement in hepatic metastases Cycle 8 FOLFOX 05/10/2016 Cycle 9 FOLFOX 05/23/2016 (oxaliplatin held due to thrombocytopenia) Cycle 10 FOLFOX 06/06/2016 (oxaliplatin dose reduced due to thrombocytopenia) Cycle 11 FOLFOX 06/27/2016 (oxaliplatin held due to neuropathy) Cycle 12 FOLFOX 07/11/2016 (oxaliplatin held) Initiation of maintenance Xeloda 7 days on/7 days off 07/27/2016 MRI liver 11/18/2016-decrease in hepatic metastatic disease. No new or progressive disease identified within the abdomen. Continuation of Xeloda 7 days on/7 days off MRI liver 04/27/2017-previous liver lesions not identified, no new lesions, no lymphadenopathy Xeloda continued 7 days on/7 days off MRI liver 12/04/2017 - no evidence of metastatic disease, hepatic steatosis Xeloda continued 7 days on/7 days off MRI liver 07/15/2018- no evidence of metastatic disease.  Stable severe hepatic steatosis. Xeloda continued 7 days on/7 days off MRI liver 03/16/2019-hepatic steatosis, no liver mass, focal area of intrahepatic biliary dilatation in segments 2 and 3 of the left lobe-increased Xeloda continued 7 days on/7 days off MRI abdomen 08/19/2019-no findings to suggest liver metastases.  Bilateral lung nodules measuring up to 2.8 cm, progressive and more conspicuous than on previous exam CT chest 08/29/2019-multiple pulmonary metastases, new from 04/21/2016 Cycle 1 FOLFIRI/bevacizumab 09/09/2019 Cycle 2 FOLFIRI/bevacizumab 09/26/2019  Cycle 3 FOLFIRI/bevacizumab 10/10/2019 Cycle 4 FOLFIRI/bevacizumab  10/24/2019 Cycle 5 FOLFIRI/bevacizumab 11/07/2019 CT chest 11/14/2019-decreased size of lung nodules, no new lesions, hepatic steatosis Cycle 6 FOLFIRI/bevacizumab 11/21/2019 Cycle 7 FOLFIRI/bevacizumab  12/05/2019 Cycle 8 FOLFIRI/bevacizumab 12/19/2019 Cycle 9 FOLFIRI/bevacizumab 01/02/2020 Cycle 10 FOLFIRI/bevacizumab 01/16/2020 CT chest 01/29/2020-stable bilateral pulmonary metastases.  No new or progressive metastatic disease in the chest. Cycle 11 FOLFIRI/bevacizumab 01/30/2020 Cycle 12 FOLFIRI/bevacizumab 02/19/2020 Cycle 13 FOLFIRI/bevacizumab 03/12/2020 Cycle 14 FOLFIRI/bevacizumab 04/02/2020 Cycle 15 FOLFIRI/bevacizumab 04/23/2020 Cycle 16 FOLFIRI/bevacizumab 05/21/2020 CT chest 06/09/2020-mild progression pulmonary metastasis.  Some lesions have increased in size while others are similar. Cycle 1 irinotecan/Panitumumab 06/18/2020 Cycle 2 irinotecan/Panitumumab 07/02/2020 Cycle 3 irinotecan/panitumumab 07/16/2020 Cycle 4 irinotecan/Panitumumab 07/30/2020 Cycle 5 irinotecan/Panitumumab 08/13/2020, Emend added Cycle 6 irinotecan/Panitumumab 08/27/2020 CT chest 09/08/2020-decreased size of pulmonary nodules, no progressive disease Cycle 7 irinotecan/panitumumab 09/02/2020 Cycle 8 irinotecan/panitumumab 09/24/2020 Cycle 9 irinotecan/Panitumumab 10/08/2020 10/22/2020 treatment held due to left foot fracture, need for surgery Cycle 10 irinotecan/panitumumab 11/24/2020 Cycle 11 irinotecan/Panitumumab 12/08/2020   2.   Rectal bleeding and constipation secondary to #1   3.   History of peripheral vascular disease, status post left lower extremity vascular bypass surgery in April 2017   4.   History of nephrolithiasis   5.   History of Graves' disease treated with radioactive iodine   6.   Anxiety/depression   7.   Hypertension   8.   Hospitalization 01/19/2016 with wound dehiscence status post secondary suture closure of abdominal wall   9.   Thrombocytopenia secondary to chemotherapy-oxaliplatin held with  cycle 6 and cycle 9 FOLFOX   10. Hyperglycemia 06/20/2016-diagnosed with diabetes, maintained on insulin   11.  Positive COVID test 12/13/2018   12. Hospitalized with seizure activity/DKA. Now on Keppra, insulin adjusted. 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  Left foot fracture-surgical repair 11/11/2020    Disposition: Jeff [name] appears stable.  He continues every 2-week irinotecan/Panitumumab.  There is no clinical evidence of disease progression.  Plan to continue the same.  We reviewed the CBC from today.  Counts adequate to proceed with treatment.  He will return for lab, follow-up, irinotecan/Panitumumab in 2 weeks.  He will contact the office in the interim with any problems.    Ned Card ANP/GNP-BC   12/08/2020  9:24 AM

## 2020-12-16 ENCOUNTER — Other Ambulatory Visit: Payer: Self-pay | Admitting: Oncology

## 2020-12-17 ENCOUNTER — Ambulatory Visit (INDEPENDENT_AMBULATORY_CARE_PROVIDER_SITE_OTHER): Payer: Medicaid Other

## 2020-12-17 ENCOUNTER — Ambulatory Visit (INDEPENDENT_AMBULATORY_CARE_PROVIDER_SITE_OTHER): Payer: Medicaid Other | Admitting: Podiatry

## 2020-12-17 ENCOUNTER — Encounter: Payer: Self-pay | Admitting: Podiatry

## 2020-12-17 ENCOUNTER — Other Ambulatory Visit: Payer: Self-pay

## 2020-12-17 DIAGNOSIS — S93325D Dislocation of tarsometatarsal joint of left foot, subsequent encounter: Secondary | ICD-10-CM

## 2020-12-17 MED ORDER — OXYCODONE-ACETAMINOPHEN 5-325 MG PO TABS: 1.0000 | ORAL_TABLET | ORAL | 0 refills | Status: DC | PRN

## 2020-12-17 NOTE — Progress Notes (Signed)
  Subjective:  Patient ID: Jeff [name], male    DOB: April 29, 1966,  MRN: [id number]  Chief Complaint  Patient presents with   Routine Post Op    I am doing a lot better on the left foot and ready to get rid of the scooter   DOS: 11/11/20 Procedure: Left foot 2nd TMT fusion, repair of midfoot dislocation   55 y.o. male presents with the above complaint. History confirmed with patient. Not having a lot of pain. Objective:  Physical Exam: no tenderness at the surgical site, local edema noted, and calf supple, nontender. Incision: well healed   Assessment:   1. Lisfranc dislocation, left, subsequent encounter    Plan:  Patient was evaluated and treated and all questions answered.  Post-operative State -XR taken and reviewed. Bridging across the arthrodesis site with good positioning of the 2nd metatarsal noted. -Start WB in [place] -Pain medication refilled -XRs needed at follow-up: 3 view Foot    Return in about 2 weeks (around 12/31/2020) for Post-Op (with XRs).

## 2020-12-18 ENCOUNTER — Telehealth: Payer: Self-pay | Admitting: *Deleted

## 2020-12-18 ENCOUNTER — Other Ambulatory Visit: Payer: Self-pay | Admitting: Podiatry

## 2020-12-18 MED ORDER — OXYCODONE HCL 5 MG PO TABS: 5.0000 mg | ORAL_TABLET | ORAL | 0 refills | Status: DC | PRN

## 2020-12-18 NOTE — Telephone Encounter (Signed)
Called and spoke with AJ and per Dr March Rummage do not fill the RX from yesterday's 10-325 percocet and Dr March Rummage sent over a new RX today. Lattie Haw

## 2020-12-18 NOTE — Telephone Encounter (Signed)
-----   Message from Evelina Bucy, DPM sent at 12/18/2020  9:46 AM EDT ----- Can you let them know he's post-op and he should only be taking the percocet 10 q4 hours ----- Message ----- From: Viviana Simpler, PMAC Sent: 12/18/2020   9:35 AM EDT To: Evelina Bucy, DPM  AJ from [facility] called and stated that they needed clarification on the medicine that you sent over due to patient was already taken percocet 10-325. Lattie Haw

## 2020-12-18 NOTE — Progress Notes (Signed)
Sent new Rx.

## 2020-12-21 ENCOUNTER — Other Ambulatory Visit: Payer: Self-pay | Admitting: Nurse Practitioner

## 2020-12-21 ENCOUNTER — Telehealth: Payer: Self-pay | Admitting: *Deleted

## 2020-12-21 ENCOUNTER — Other Ambulatory Visit: Payer: Self-pay | Admitting: Oncology

## 2020-12-21 DIAGNOSIS — C189 Malignant neoplasm of colon, unspecified: Secondary | ICD-10-CM

## 2020-12-21 NOTE — Telephone Encounter (Signed)
Jeff [name] w/ [facility] is requesting additional information on a prescription that was sent(oxycodone-5 mg), patient has had multiple prescriptions from another doctor(oxycodone 10-325 mg). The pharmacy wanted to make aware. She asked the patient and he said that he takes one for his back and another for the foot. She will not proceed until she hears back.Please advise.

## 2020-12-22 ENCOUNTER — Inpatient Hospital Stay (HOSPITAL_BASED_OUTPATIENT_CLINIC_OR_DEPARTMENT_OTHER): Payer: Medicaid Other | Admitting: Oncology

## 2020-12-22 ENCOUNTER — Inpatient Hospital Stay: Payer: Medicaid Other | Attending: Oncology

## 2020-12-22 ENCOUNTER — Other Ambulatory Visit: Payer: Self-pay

## 2020-12-22 ENCOUNTER — Inpatient Hospital Stay: Payer: Medicaid Other

## 2020-12-22 VITALS — BP 134/80 | HR 102 | Temp 98.1°F | Resp 18 | Ht 70.0 in | Wt 246.8 lb

## 2020-12-22 VITALS — HR 95

## 2020-12-22 DIAGNOSIS — C187 Malignant neoplasm of sigmoid colon: Secondary | ICD-10-CM | POA: Diagnosis present

## 2020-12-22 DIAGNOSIS — Z5111 Encounter for antineoplastic chemotherapy: Secondary | ICD-10-CM | POA: Insufficient documentation

## 2020-12-22 DIAGNOSIS — Z5112 Encounter for antineoplastic immunotherapy: Secondary | ICD-10-CM | POA: Diagnosis not present

## 2020-12-22 DIAGNOSIS — C7801 Secondary malignant neoplasm of right lung: Secondary | ICD-10-CM | POA: Diagnosis not present

## 2020-12-22 DIAGNOSIS — C189 Malignant neoplasm of colon, unspecified: Secondary | ICD-10-CM | POA: Diagnosis not present

## 2020-12-22 DIAGNOSIS — C7802 Secondary malignant neoplasm of left lung: Secondary | ICD-10-CM | POA: Insufficient documentation

## 2020-12-22 DIAGNOSIS — C787 Secondary malignant neoplasm of liver and intrahepatic bile duct: Secondary | ICD-10-CM | POA: Diagnosis not present

## 2020-12-22 LAB — CBC WITH DIFFERENTIAL (CANCER CENTER ONLY)
Abs Immature Granulocytes: 0.05 10*3/uL (ref 0.00–0.07)
Basophils Absolute: 0 10*3/uL (ref 0.0–0.1)
Basophils Relative: 0 %
Eosinophils Absolute: 0.2 10*3/uL (ref 0.0–0.5)
Eosinophils Relative: 3 %
HCT: 42.6 % (ref 39.0–52.0)
Hemoglobin: 12.9 g/dL — ABNORMAL LOW (ref 13.0–17.0)
Immature Granulocytes: 1 %
Lymphocytes Relative: 33 %
Lymphs Abs: 2.9 10*3/uL (ref 0.7–4.0)
MCH: 26.1 pg (ref 26.0–34.0)
MCHC: 30.3 g/dL (ref 30.0–36.0)
MCV: 86.1 fL (ref 80.0–100.0)
Monocytes Absolute: 0.6 10*3/uL (ref 0.1–1.0)
Monocytes Relative: 6 %
Neutro Abs: 5.1 10*3/uL (ref 1.7–7.7)
Neutrophils Relative %: 57 %
Platelet Count: 198 10*3/uL (ref 150–400)
RBC: 4.95 MIL/uL (ref 4.22–5.81)
RDW: 16.9 % — ABNORMAL HIGH (ref 11.5–15.5)
WBC Count: 8.8 10*3/uL (ref 4.0–10.5)
nRBC: 0 % (ref 0.0–0.2)

## 2020-12-22 LAB — CMP (CANCER CENTER ONLY)
ALT: 58 U/L — ABNORMAL HIGH (ref 0–44)
AST: 35 U/L (ref 15–41)
Albumin: 4.1 g/dL (ref 3.5–5.0)
Alkaline Phosphatase: 91 U/L (ref 38–126)
Anion gap: 12 (ref 5–15)
BUN: 9 mg/dL (ref 6–20)
CO2: 26 mmol/L (ref 22–32)
Calcium: 8.8 mg/dL — ABNORMAL LOW (ref 8.9–10.3)
Chloride: 102 mmol/L (ref 98–111)
Creatinine: 1.21 mg/dL (ref 0.61–1.24)
GFR, Estimated: >60 mL/min (ref >60)
Glucose, Bld: 174 mg/dL — ABNORMAL HIGH (ref 70–99)
Potassium: 3.6 mmol/L (ref 3.5–5.1)
Sodium: 140 mmol/L (ref 135–145)
Total Bilirubin: 0.4 mg/dL (ref 0.3–1.2)
Total Protein: 7.2 g/dL (ref 6.5–8.1)

## 2020-12-22 LAB — CEA (ACCESS): CEA (CHCC): 4.82 ng/mL (ref 0.00–5.00)

## 2020-12-22 LAB — MAGNESIUM: Magnesium: 1.6 mg/dL — ABNORMAL LOW (ref 1.7–2.4)

## 2020-12-22 MED ORDER — SODIUM CHLORIDE 0.9 % IV SOLN
Freq: Once | INTRAVENOUS | Status: AC
  Filled 2020-12-22: qty 250

## 2020-12-22 MED ORDER — ATROPINE SULFATE 1 MG/ML IJ SOLN
0.4000 mg | Freq: Once | INTRAMUSCULAR | Status: AC
  Administered 2020-12-22: 0.4 mg via INTRAVENOUS
  Filled 2020-12-22: qty 1

## 2020-12-22 MED ORDER — SODIUM CHLORIDE 0.9 % IV SOLN
180.0000 mg/m2 | Freq: Once | INTRAVENOUS | Status: AC
  Administered 2020-12-22: 420 mg via INTRAVENOUS
  Filled 2020-12-22: qty 15

## 2020-12-22 MED ORDER — HEPARIN SOD (PORK) LOCK FLUSH 100 UNIT/ML IV SOLN
500.0000 [IU] | Freq: Once | INTRAVENOUS | Status: AC | PRN
  Administered 2020-12-22: 500 [IU]
  Filled 2020-12-22: qty 5

## 2020-12-22 MED ORDER — DEXAMETHASONE SODIUM PHOSPHATE 10 MG/ML IJ SOLN
5.0000 mg | Freq: Once | INTRAMUSCULAR | Status: AC
  Administered 2020-12-22: 5 mg via INTRAVENOUS
  Filled 2020-12-22: qty 1

## 2020-12-22 MED ORDER — SODIUM CHLORIDE 0.9 % IV SOLN
150.0000 mg | Freq: Once | INTRAVENOUS | Status: AC
  Administered 2020-12-22: 150 mg via INTRAVENOUS
  Filled 2020-12-22: qty 5

## 2020-12-22 MED ORDER — SODIUM CHLORIDE 0.9 % IV SOLN
6.0000 mg/kg | Freq: Once | INTRAVENOUS | Status: AC
  Administered 2020-12-22: 700 mg via INTRAVENOUS
  Filled 2020-12-22: qty 15

## 2020-12-22 MED ORDER — MAGNESIUM SULFATE 2 GM/50ML IV SOLN
2.0000 g | Freq: Once | INTRAVENOUS | Status: AC
  Administered 2020-12-22: 2 g via INTRAVENOUS
  Filled 2020-12-22: qty 50

## 2020-12-22 MED ORDER — PALONOSETRON HCL INJECTION 0.25 MG/5ML
0.2500 mg | Freq: Once | INTRAVENOUS | Status: AC
  Administered 2020-12-22: 0.25 mg via INTRAVENOUS
  Filled 2020-12-22: qty 5

## 2020-12-22 MED ORDER — SODIUM CHLORIDE 0.9% FLUSH
10.0000 mL | INTRAVENOUS | Status: DC | PRN
  Administered 2020-12-22: 10 mL
  Filled 2020-12-22: qty 10

## 2020-12-22 NOTE — Patient Instructions (Signed)
Implanted Port Home Guide An implanted port is a device that is placed under the skin. It is usually placed in the chest. The device can be used to give IV medicine, to take blood, or for dialysis. You may have an implanted port if: You need IV medicine that would be irritating to the small veins in your hands or arms. You need IV medicines, such as antibiotics, for a long period of time. You need IV nutrition for a long period of time. You need dialysis. When you have a port, your health care provider can choose to use the port instead of veins in your arms for these procedures. You may have fewer limitations when using a port than you would if you used other types of long-term IVs, and you will likely be able to return to normal activities afteryour incision heals. An implanted port has two main parts: Reservoir. The reservoir is the part where a needle is inserted to give medicines or draw blood. The reservoir is round. After it is placed, it appears as a small, raised area under your skin. Catheter. The catheter is a thin, flexible tube that connects the reservoir to a vein. Medicine that is inserted into the reservoir goes into the catheter and then into the vein. How is my port accessed? To access your port: A numbing cream may be placed on the skin over the port site. Your health care provider will put on a mask and sterile gloves. The skin over your port will be cleaned carefully with a germ-killing soap and allowed to dry. Your health care provider will gently pinch the port and insert a needle into it. Your health care provider will check for a blood return to make sure the port is in the vein and is not clogged. If your port needs to remain accessed to get medicine continuously (constant infusion), your health care provider will place a clear bandage (dressing) over the needle site. The dressing and needle will need to be changed every week, or as told by your health care provider. What  is flushing? Flushing helps keep the port from getting clogged. Follow instructions from your health care provider about how and when to flush the port. Ports are usually flushed with saline solution or a medicine called heparin. The need for flushing will depend on how the port is used: If the port is only used from time to time to give medicines or draw blood, the port may need to be flushed: Before and after medicines have been given. Before and after blood has been drawn. As part of routine maintenance. Flushing may be recommended every 4-6 weeks. If a constant infusion is running, the port may not need to be flushed. Throw away any syringes in a disposal container that is meant for sharp items (sharps container). You can buy a sharps container from a pharmacy, or you can make one by using an empty hard plastic bottle with a cover. How long will my port stay implanted? The port can stay in for as long as your health care provider thinks it is needed. When it is time for the port to come out, a surgery will be done to remove it. The surgery will be similar to the procedure that was done to putthe port in. Follow these instructions at home:  Flush your port as told by your health care provider. If you need an infusion over several days, follow instructions from your health care provider about how to take   care of your port site. Make sure you: Wash your hands with soap and water before you change your dressing. If soap and water are not available, use alcohol-based hand sanitizer. Change your dressing as told by your health care provider. Place any used dressings or infusion bags into a plastic bag. Throw that bag in the trash. Keep the dressing that covers the needle clean and dry. Do not get it wet. Do not use scissors or sharp objects near the tube. Keep the tube clamped, unless it is being used. Check your port site every day for signs of infection. Check for: Redness, swelling, or  pain. Fluid or blood. Pus or a bad smell. Protect the skin around the port site. Avoid wearing bra straps that rub or irritate the site. Protect the skin around your port from seat belts. Place a soft pad over your chest if needed. Bathe or shower as told by your health care provider. The site may get wet as long as you are not actively receiving an infusion. Return to your normal activities as told by your health care provider. Ask your health care provider what activities are safe for you. Carry a medical alert card or wear a medical alert bracelet at all times. This will let health care providers know that you have an implanted port in case of an emergency. Get help right away if: You have redness, swelling, or pain at the port site. You have fluid or blood coming from your port site. You have pus or a bad smell coming from the port site. You have a fever. Summary Implanted ports are usually placed in the chest for long-term IV access. Follow instructions from your health care provider about flushing the port and changing bandages (dressings). Take care of the area around your port by avoiding clothing that puts pressure on the area, and by watching for signs of infection. Protect the skin around your port from seat belts. Place a soft pad over your chest if needed. Get help right away if you have a fever or you have redness, swelling, pain, drainage, or a bad smell at the port site. This information is not intended to replace advice given to you by your health care provider. Make sure you discuss any questions you have with your healthcare provider. Document Revised: 09/16/2019 Document Reviewed: 09/16/2019 [employer] Patient Education  2022 [employer]  

## 2020-12-22 NOTE — Patient Instructions (Signed)
[facility]  Discharge Instructions: Thank you for choosing [facility] to provide your oncology and hematology care.   If you have a lab appointment with the [facility], please go directly to the [facility] and check in at the registration area.   Wear comfortable clothing and clothing appropriate for easy access to any Portacath or PICC line.   We strive to give you quality time with your provider. You may need to reschedule your appointment if you arrive late (15 or more minutes).  Arriving late affects you and other patients whose appointments are after yours.  Also, if you miss three or more appointments without notifying the office, you may be dismissed from the clinic at the provider's discretion.      For prescription refill requests, have your pharmacy contact our office and allow 72 hours for refills to be completed.    Today you received the following chemotherapy and/or immunotherapy agents panitumumab, irinotecan To help prevent nausea and vomiting after your treatment, we encourage you to take your nausea medication as directed.  BELOW ARE SYMPTOMS THAT SHOULD BE REPORTED IMMEDIATELY: *FEVER GREATER THAN 100.4 F (38 C) OR HIGHER *CHILLS OR SWEATING *NAUSEA AND VOMITING THAT IS NOT CONTROLLED WITH YOUR NAUSEA MEDICATION *UNUSUAL SHORTNESS OF BREATH *UNUSUAL BRUISING OR BLEEDING *URINARY PROBLEMS (pain or burning when urinating, or frequent urination) *BOWEL PROBLEMS (unusual diarrhea, constipation, pain near the anus) TENDERNESS IN MOUTH AND THROAT WITH OR WITHOUT PRESENCE OF ULCERS (sore throat, sores in mouth, or a toothache) UNUSUAL RASH, SWELLING OR PAIN  UNUSUAL VAGINAL DISCHARGE OR ITCHING   Items with * indicate a potential emergency and should be followed up as soon as possible or go to the Emergency Department if any problems should occur.  Please show the CHEMOTHERAPY ALERT CARD or IMMUNOTHERAPY ALERT CARD at check-in  to the Emergency Department and triage nurse.  Should you have questions after your visit or need to cancel or reschedule your appointment, please contact [facility]  Dept: [phone]  and follow the prompts.  Office hours are 8:00 a.m. to 4:30 p.m. Monday - Friday. Please note that voicemails left after 4:00 p.m. may not be returned until the following business day.  We are closed weekends and major holidays. You have access to a nurse at all times for urgent questions. Please call the main number to the clinic Dept: [phone] and follow the prompts.   For any non-urgent questions, you may also contact your provider using MyChart. We now offer e-Visits for anyone 51 and older to request care online for non-urgent symptoms. For details visit [email or web address].   Also download the MyChart app! Go to the app store, search "MyChart", open the app, select [facility], and log in with your MyChart username and password.  Due to Covid, a mask is required upon entering the hospital/clinic. If you do not have a mask, one will be given to you upon arrival. For doctor visits, patients may have 1 support person aged 89 or older with them. For treatment visits, patients cannot have anyone with them due to current Covid guidelines and our immunocompromised population.   Panitumumab Solution for Injection What is this medication? PANITUMUMAB (pan i TOOM ue mab) is a monoclonal antibody. It is used to treatcolorectal cancer. This medicine may be used for other purposes; ask your health care provider orpharmacist if you have questions. COMMON BRAND NAME(S): Vectibix What should I tell my care team  before I take this medication? They need to know if you have any of these conditions: eye disease, vision problems low levels of calcium, magnesium, or potassium in the blood lung or breathing disease, like asthma skin conditions or sensitivity an unusual or allergic reaction to  panitumumab, other medicines, foods, dyes, or preservatives pregnant or trying to get pregnant breast-feeding How should I use this medication? This drug is given as an infusion into a vein. It is administered in a hospitalor clinic by a specially trained health care professional. Talk to your pediatrician regarding the use of this medicine in children.Special care may be needed. Overdosage: If you think you have taken too much of this medicine contact apoison control center or emergency room at once. NOTE: This medicine is only for you. Do not share this medicine with others. What if I miss a dose? It is important not to miss your dose. Call your doctor or health careprofessional if you are unable to keep an appointment. What may interact with this medication? Do not take this medicine with any of the following medications: bevacizumab This list may not describe all possible interactions. Give your health care provider a list of all the medicines, herbs, non-prescription drugs, or dietary supplements you use. Also tell them if you smoke, drink alcohol, or use illegaldrugs. Some items may interact with your medicine. What should I watch for while using this medication? Visit your doctor for checks on your progress. This drug may make you feel generally unwell. This is not uncommon, as chemotherapy can affect healthy cells as well as cancer cells. Report any side effects. Continue your course oftreatment even though you feel ill unless your doctor tells you to stop. This medicine can make you more sensitive to the sun. Keep out of the sun while receiving this medicine and for 2 months after the last dose. If you cannot avoid being in the sun, wear protective clothing and use sunscreen. Do not usesun lamps or tanning beds/booths. In some cases, you may be given additional medicines to help with side effects.Follow all directions for their use. Call your doctor or health care professional for advice  if you get a fever, chills or sore throat, or other symptoms of a cold or flu. Do not treat yourself. This drug decreases your body's ability to fight infections. Try toavoid being around people who are sick. Avoid taking products that contain aspirin, acetaminophen, ibuprofen, naproxen, or ketoprofen unless instructed by your doctor. These medicines may hide afever. Do not become pregnant while taking this medicine and for 2 months after the last dose. Women should inform their doctor if they wish to become pregnant or think they might be pregnant. There is a potential for serious side effects to an unborn child. Talk to your health care professional or pharmacist for more information. Do not breast-feed an infant while taking this medicine or for 53month after the last dose. What side effects may I notice from receiving this medication? Side effects that you should report to your doctor or health care professionalas soon as possible: allergic reactions like skin rash, itching or hives, swelling of the face, lips, or tongue breathing problems changes in vision eye pain fast, irregular heartbeat fever, chills mouth sores red spots on the skin redness, blistering, peeling or loosening of the skin, including inside the mouth signs and symptoms of kidney injury like trouble passing urine or change in the amount of urine signs and symptoms of low blood pressure like dizziness; feeling  faint or lightheaded, falls; unusually weak or tired signs of low calcium like fast heartbeat, muscle cramps or muscle pain; pain, tingling, numbness in the hands or feet; seizures signs and symptoms of low magnesium like muscle cramps, pain, or weakness; tremors; seizures; or fast, irregular heartbeat signs and symptoms of low potassium like muscle cramps or muscle pain; chest pain; dizziness; feeling faint or lightheaded, falls; palpitations; breathing problems; or fast, irregular heartbeat swelling of the ankles,  feet, hands Side effects that usually do not require medical attention (report to yourdoctor or health care professional if they continue or are bothersome): changes in skin like acne, cracks, skin dryness diarrhea eyelash growth headache mouth sores nail changes nausea, vomiting This list may not describe all possible side effects. Call your doctor for medical advice about side effects. You may report side effects to FDA at1-800-FDA-1088. Where should I keep my medication? This drug is given in a hospital or clinic and will not be stored at home. NOTE: This sheet is a summary. It may not cover all possible information. If you have questions about this medicine, talk to your doctor, pharmacist, orhealth care provider.  2022 Elsevier/Gold Standard (2015-11-20 16:45:04)  Irinotecan injection What is this medication? IRINOTECAN (ir in oh TEE kan ) is a chemotherapy drug. It is used to treatcolon and rectal cancer. This medicine may be used for other purposes; ask your health care provider orpharmacist if you have questions. COMMON BRAND NAME(S): Camptosar What should I tell my care team before I take this medication? They need to know if you have any of these conditions: dehydration diarrhea infection (especially a virus infection such as chickenpox, cold sores, or herpes) liver disease low blood counts, like low white cell, platelet, or red cell counts low levels of calcium, magnesium, or potassium in the blood recent or ongoing radiation therapy an unusual or allergic reaction to irinotecan, other medicines, foods, dyes, or preservatives pregnant or trying to get pregnant breast-feeding How should I use this medication? This drug is given as an infusion into a vein. It is administered in a hospitalor clinic by a specially trained health care professional. Talk to your pediatrician regarding the use of this medicine in children.Special care may be needed. Overdosage: If you think  you have taken too much of this medicine contact apoison control center or emergency room at once. NOTE: This medicine is only for you. Do not share this medicine with others. What if I miss a dose? It is important not to miss your dose. Call your doctor or health careprofessional if you are unable to keep an appointment. What may interact with this medication? Do not take this medicine with any of the following medications: cobicistat itraconazole This medicine may interact with the following medications: antiviral medicines for HIV or AIDS certain antibiotics like rifampin or rifabutin certain medicines for fungal infections like ketoconazole, posaconazole, and voriconazole certain medicines for seizures like carbamazepine, phenobarbital, phenotoin clarithromycin gemfibrozil nefazodone [facility]. John's Wort This list may not describe all possible interactions. Give your health care provider a list of all the medicines, herbs, non-prescription drugs, or dietary supplements you use. Also tell them if you smoke, drink alcohol, or use illegaldrugs. Some items may interact with your medicine. What should I watch for while using this medication? Your condition will be monitored carefully while you are receiving this medicine. You will need important blood work done while you are taking thismedicine. This drug may make you feel generally unwell. This is not uncommon,  as chemotherapy can affect healthy cells as well as cancer cells. Report any side effects. Continue your course of treatment even though you feel ill unless yourdoctor tells you to stop. In some cases, you may be given additional medicines to help with side effects.Follow all directions for their use. You may get drowsy or dizzy. Do not drive, use machinery, or do anything that needs mental alertness until you know how this medicine affects you. Do not stand or sit up quickly, especially if you are an older patient. This reducesthe risk of  dizzy or fainting spells. Call your health care professional for advice if you get a fever, chills, or sore throat, or other symptoms of a cold or flu. Do not treat yourself. This medicine decreases your body's ability to fight infections. Try to avoid beingaround people who are sick. Avoid taking products that contain aspirin, acetaminophen, ibuprofen, naproxen, or ketoprofen unless instructed by your doctor. These medicines may hide afever. This medicine may increase your risk to bruise or bleed. Call your doctor orhealth care professional if you notice any unusual bleeding. Be careful brushing and flossing your teeth or using a toothpick because you may get an infection or bleed more easily. If you have any dental work done,tell your dentist you are receiving this medicine. Do not become pregnant while taking this medicine or for 6 months after stopping it. Women should inform their health care professional if they wish to become pregnant or think they might be pregnant. Men should not father a child while taking this medicine and for 3 months after stopping it. There is potential for serious side effects to an unborn child. Talk to your health careprofessional for more information. Do not breast-feed an infant while taking this medicine or for 7 days afterstopping it. This medicine has caused ovarian failure in some women. This medicine may make it more difficult to get pregnant. Talk to your health care professional if [place] concerned about your fertility. This medicine has caused decreased sperm counts in some men. This may make it more difficult to father a child. Talk to your health care professional if [place] concerned about your fertility. What side effects may I notice from receiving this medication? Side effects that you should report to your doctor or health care professionalas soon as possible: allergic reactions like skin rash, itching or hives, swelling of the face, lips, or tongue chest  pain diarrhea flushing, runny nose, sweating during infusion low blood counts - this medicine may decrease the number of white blood cells, red blood cells and platelets. You may be at increased risk for infections and bleeding. nausea, vomiting pain, swelling, warmth in the leg signs of decreased platelets or bleeding - bruising, pinpoint red spots on the skin, black, tarry stools, blood in the urine signs of infection - fever or chills, cough, sore throat, pain or difficulty passing urine signs of decreased red blood cells - unusually weak or tired, fainting spells, lightheadedness Side effects that usually do not require medical attention (report to yourdoctor or health care professional if they continue or are bothersome): constipation hair loss headache loss of appetite mouth sores stomach pain This list may not describe all possible side effects. Call your doctor for medical advice about side effects. You may report side effects to FDA at1-800-FDA-1088. Where should I keep my medication? This drug is given in a hospital or clinic and will not be stored at home. NOTE: This sheet is a summary. It may not cover all  possible information. If you have questions about this medicine, talk to your doctor, pharmacist, orhealth care provider.  2022 Elsevier/Gold Standard (2019-04-02 17:46:13)

## 2020-12-22 NOTE — Progress Notes (Addendum)
[facility] OFFICE PROGRESS NOTE   Diagnosis: Colon cancer  INTERVAL HISTORY:   Mr. Blew completed another cycle of irinotecan/panitumumab on 12/08/2020.  Mild nausea.  No vomiting.  No significant diarrhea.  Stable skin rash.  The left foot is healing.  Objective:  Vital signs in last 24 hours:  Blood pressure 134/80, pulse (!) 102, temperature 98.1 F (36.7 C), temperature source Oral, resp. rate 18, height $RemoveBe'5\' 10"'[id number]$  (1.778 m), weight 246 lb 12.8 oz (111.9 kg), SpO2 98 %.    HEENT: No thrush or ulcers Resp: Lungs clear bilaterally Cardio: Regular rate and rhythm GI: No hepatomegaly Vascular: No leg edema  Skin: Acne type rash over the chest and face  Portacath/PICC-without erythema  Lab Results:  Lab Results  Component Value Date   WBC 8.8 12/22/2020   HGB 12.9 (L) 12/22/2020   HCT 42.6 12/22/2020   MCV 86.1 12/22/2020   PLT 198 12/22/2020   NEUTROABS 5.1 12/22/2020    CMP  Lab Results  Component Value Date   NA 138 12/08/2020   K 3.4 (L) 12/08/2020   CL 104 12/08/2020   CO2 22 12/08/2020   GLUCOSE 252 (H) 12/08/2020   BUN 8 12/08/2020   CREATININE 1.00 12/08/2020   CALCIUM 8.7 (L) 12/08/2020   PROT 7.5 12/08/2020   ALBUMIN 4.0 12/08/2020   AST 47 (H) 12/08/2020   ALT 51 (H) 12/08/2020   ALKPHOS 101 12/08/2020   BILITOT 0.4 12/08/2020   GFRNONAA >60 12/08/2020   GFRAA >60 01/30/2020    Lab Results  Component Value Date   CEA1 3.19 09/10/2020   CEA 5.03 (H) 12/08/2020    Medications: I have reviewed the patient's current medications.   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, biopsy confirmed adenocarcinoma         CTs chest, abdomen, and pelvis on 12/11/2015-indeterminate tiny pulmonary nodules, multiple liver metastases, small nodes in the sigmoid mesocolon Laparoscopic sigmoid colectomy, liver biopsy, Port-A-Cath placement 01/14/2016 Pathology sigmoid colon  resection- colonic adenocarcinoma, 5.3 cm extending into pericolonic connective tissue, positive lymph-vascular invasion, positive perineural invasion, negative margins, metastatic carcinoma in 9 of 28 lymph nodes Pathology liver biopsy-metastatic colorectal adenocarcinoma MSI stable; mismatch repair protein normal APC alteration, K RAS wild-type, no BRAF mutation Cycle 1 FOLFOX 02/02/2016 Cycle 2 FOLFOX 02/15/2016 Cycle 3 FOLFOX 02/29/2016 Cycle 4 FOLFOX 03/14/2016 Cycle 5 FOLFOX 03/28/2016 Cycle 6 FOLFOX 04/11/2016 (oxaliplatin held secondary to thrombocytopenia) 04/21/2016 restaging CTs-difficulty evaluating liver lesions due to hepatic steatosis. Stable right adrenal nodule. No adenopathy or local recurrence near the rectosigmoid anastomotic site. Cycle 7 FOLFOX 04/25/2016 MRI liver 05/02/2016-partial improvement in hepatic metastases Cycle 8 FOLFOX 05/10/2016 Cycle 9 FOLFOX 05/23/2016 (oxaliplatin held due to thrombocytopenia) Cycle 10 FOLFOX 06/06/2016 (oxaliplatin dose reduced due to thrombocytopenia) Cycle 11 FOLFOX 06/27/2016 (oxaliplatin held due to neuropathy) Cycle 12 FOLFOX 07/11/2016 (oxaliplatin held) Initiation of maintenance Xeloda 7 days on/7 days off 07/27/2016 MRI liver 11/18/2016-decrease in hepatic metastatic disease. No new or progressive disease identified within the abdomen. Continuation of Xeloda 7 days on/7 days off MRI liver 04/27/2017-previous liver lesions not identified, no new lesions, no lymphadenopathy Xeloda continued 7 days on/7 days off MRI liver 12/04/2017 - no evidence of metastatic disease, hepatic steatosis Xeloda continued 7 days on/7 days off MRI liver 07/15/2018- no evidence of metastatic disease.  Stable severe hepatic steatosis. Xeloda continued 7 days on/7 days off MRI liver 03/16/2019-hepatic steatosis, no liver mass, focal area of intrahepatic biliary  dilatation in segments 2 and 3 of the left lobe-increased Xeloda continued 7 days on/7 days  off MRI abdomen 08/19/2019-no findings to suggest liver metastases.  Bilateral lung nodules measuring up to 2.8 cm, progressive and more conspicuous than on previous exam CT chest 08/29/2019-multiple pulmonary metastases, new from 04/21/2016 Cycle 1 FOLFIRI/bevacizumab 09/09/2019 Cycle 2 FOLFIRI/bevacizumab 09/26/2019  Cycle 3 FOLFIRI/bevacizumab 10/10/2019 Cycle 4 FOLFIRI/bevacizumab 10/24/2019 Cycle 5 FOLFIRI/bevacizumab 11/07/2019 CT chest 11/14/2019-decreased size of lung nodules, no new lesions, hepatic steatosis Cycle 6 FOLFIRI/bevacizumab 11/21/2019 Cycle 7 FOLFIRI/bevacizumab 12/05/2019 Cycle 8 FOLFIRI/bevacizumab 12/19/2019 Cycle 9 FOLFIRI/bevacizumab 01/02/2020 Cycle 10 FOLFIRI/bevacizumab 01/16/2020 CT chest 01/29/2020-stable bilateral pulmonary metastases.  No new or progressive metastatic disease in the chest. Cycle 11 FOLFIRI/bevacizumab 01/30/2020 Cycle 12 FOLFIRI/bevacizumab 02/19/2020 Cycle 13 FOLFIRI/bevacizumab 03/12/2020 Cycle 14 FOLFIRI/bevacizumab 04/02/2020 Cycle 15 FOLFIRI/bevacizumab 04/23/2020 Cycle 16 FOLFIRI/bevacizumab 05/21/2020 CT chest 06/09/2020-mild progression pulmonary metastasis.  Some lesions have increased in size while others are similar. Cycle 1 irinotecan/Panitumumab 06/18/2020 Cycle 2 irinotecan/Panitumumab 07/02/2020 Cycle 3 irinotecan/panitumumab 07/16/2020 Cycle 4 irinotecan/Panitumumab 07/30/2020 Cycle 5 irinotecan/Panitumumab 08/13/2020, Emend added Cycle 6 irinotecan/Panitumumab 08/27/2020 CT chest 09/08/2020-decreased size of pulmonary nodules, no progressive disease Cycle 7 irinotecan/panitumumab 09/02/2020 Cycle 8 irinotecan/panitumumab 09/24/2020 Cycle 9 irinotecan/Panitumumab 10/08/2020 10/22/2020 treatment held due to left foot fracture, need for surgery Cycle 10 irinotecan/panitumumab 11/24/2020 Cycle 11 irinotecan/Panitumumab 12/08/2020 Cycle 12 irinotecan/panitumumab 12/22/2020   2.   Rectal bleeding and constipation secondary to #1   3.   History of peripheral  vascular disease, status post left lower extremity vascular bypass surgery in April 2017   4.   History of nephrolithiasis   5.   History of Graves' disease treated with radioactive iodine   6.   Anxiety/depression   7.   Hypertension   8.   Hospitalization 01/19/2016 with wound dehiscence status post secondary suture closure of abdominal wall   9.   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 Hyperglycemia 06/20/2016-diagnosed with diabetes, maintained on insulin   11.  Positive COVID test 12/13/2018   12. Hospitalized with seizure activity/DKA. Now on Keppra, insulin adjusted. 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  Left foot fracture-surgical repair 11/11/2020      Disposition: Jeff [name] appears stable.  He will complete another cycle of irinotecan/panitumumab today.  He will return for an office visit and chemotherapy in 2 weeks.  He will be referred for restaging CTs after the next cycle of chemotherapy.  Betsy Coder, MD  12/22/2020  9:03 AM

## 2020-12-22 NOTE — Progress Notes (Signed)
Per Dr. Benay Spice, ok to proceed with treatment with Mg 1.6. Verbal order received for Mg IV replacement.  Roselind Messier, RPH to put orders in.

## 2020-12-25 ENCOUNTER — Other Ambulatory Visit: Payer: Self-pay | Admitting: *Deleted

## 2020-12-25 MED ORDER — OLANZAPINE 10 MG PO TABS: 5.0000 mg | ORAL_TABLET | ORAL | 3 refills | Status: DC

## 2020-12-25 NOTE — Telephone Encounter (Signed)
Received faxed refill request for Zyprexa 10 mg. Refill approved.

## 2020-12-29 ENCOUNTER — Ambulatory Visit (HOSPITAL_BASED_OUTPATIENT_CLINIC_OR_DEPARTMENT_OTHER): Payer: Medicaid Other | Attending: Neurology | Admitting: Internal Medicine

## 2020-12-29 ENCOUNTER — Other Ambulatory Visit: Payer: Self-pay

## 2020-12-29 VITALS — Ht 70.0 in | Wt 240.0 lb

## 2020-12-29 DIAGNOSIS — G4733 Obstructive sleep apnea (adult) (pediatric): Secondary | ICD-10-CM | POA: Diagnosis not present

## 2020-12-29 DIAGNOSIS — R400 Somnolence: Secondary | ICD-10-CM

## 2020-12-31 ENCOUNTER — Other Ambulatory Visit: Payer: Self-pay

## 2020-12-31 ENCOUNTER — Ambulatory Visit (INDEPENDENT_AMBULATORY_CARE_PROVIDER_SITE_OTHER): Payer: Medicaid Other | Admitting: Podiatry

## 2020-12-31 ENCOUNTER — Ambulatory Visit (INDEPENDENT_AMBULATORY_CARE_PROVIDER_SITE_OTHER): Payer: Medicaid Other

## 2020-12-31 DIAGNOSIS — M79676 Pain in unspecified toe(s): Secondary | ICD-10-CM

## 2020-12-31 DIAGNOSIS — S93325D Dislocation of tarsometatarsal joint of left foot, subsequent encounter: Secondary | ICD-10-CM

## 2020-12-31 DIAGNOSIS — L600 Ingrowing nail: Secondary | ICD-10-CM | POA: Diagnosis not present

## 2020-12-31 NOTE — Progress Notes (Signed)
  Subjective:  Patient ID: Jeff [name], male    DOB: 1966/03/02,  MRN: [id number]  No chief complaint on file.  DOS: 11/11/20 Procedure: Left foot 2nd TMT fusion, repair of midfoot dislocation   55 y.o. male presents with the above complaint. History confirmed with patient. Denies pain or discomfort. Denies other issues with his foot. Objective:  Physical Exam: no tenderness at the surgical site, local edema noted, and calf supple, nontender. Incision: well healed   Ingrown nail to the medial borders 2nd/3rd/4th toes with ss drainage Assessment:   1. Lisfranc dislocation, left, subsequent encounter   2. Ingrown nail   3. Pain around toenail    Plan:  Patient was evaluated and treated and all questions answered.  Post-operative State -Xrs taken and reviewed. Bridging across arthrodesis site. -Transition to surgical shoe. Dispensed today. ABN signed by patient. -XRs needed at follow-up: 3 view Foot   Ingrown nails -Gently debrided in slant-back fashion to patient relief.  No follow-ups on file.

## 2021-01-03 ENCOUNTER — Other Ambulatory Visit: Payer: Self-pay | Admitting: Oncology

## 2021-01-04 DIAGNOSIS — Z6835 Body mass index (BMI) 35.0-35.9, adult: Secondary | ICD-10-CM | POA: Insufficient documentation

## 2021-01-05 ENCOUNTER — Inpatient Hospital Stay (HOSPITAL_BASED_OUTPATIENT_CLINIC_OR_DEPARTMENT_OTHER): Payer: Medicaid Other | Admitting: Oncology

## 2021-01-05 ENCOUNTER — Inpatient Hospital Stay: Payer: Medicaid Other

## 2021-01-05 ENCOUNTER — Other Ambulatory Visit: Payer: Self-pay

## 2021-01-05 VITALS — BP 140/80 | HR 107 | Temp 97.9°F | Resp 18 | Ht 70.0 in | Wt 242.6 lb

## 2021-01-05 DIAGNOSIS — C187 Malignant neoplasm of sigmoid colon: Secondary | ICD-10-CM

## 2021-01-05 DIAGNOSIS — Z5112 Encounter for antineoplastic immunotherapy: Secondary | ICD-10-CM | POA: Diagnosis not present

## 2021-01-05 DIAGNOSIS — C189 Malignant neoplasm of colon, unspecified: Secondary | ICD-10-CM

## 2021-01-05 LAB — CBC WITH DIFFERENTIAL (CANCER CENTER ONLY)
Abs Immature Granulocytes: 0.03 10*3/uL (ref 0.00–0.07)
Basophils Absolute: 0 10*3/uL (ref 0.0–0.1)
Basophils Relative: 0 %
Eosinophils Absolute: 0.3 10*3/uL (ref 0.0–0.5)
Eosinophils Relative: 4 %
HCT: 41.2 % (ref 39.0–52.0)
Hemoglobin: 12.6 g/dL — ABNORMAL LOW (ref 13.0–17.0)
Immature Granulocytes: 0 %
Lymphocytes Relative: 29 %
Lymphs Abs: 2.4 10*3/uL (ref 0.7–4.0)
MCH: 26.1 pg (ref 26.0–34.0)
MCHC: 30.6 g/dL (ref 30.0–36.0)
MCV: 85.3 fL (ref 80.0–100.0)
Monocytes Absolute: 0.5 10*3/uL (ref 0.1–1.0)
Monocytes Relative: 7 %
Neutro Abs: 5 10*3/uL (ref 1.7–7.7)
Neutrophils Relative %: 60 %
Platelet Count: 196 10*3/uL (ref 150–400)
RBC: 4.83 MIL/uL (ref 4.22–5.81)
RDW: 16.5 % — ABNORMAL HIGH (ref 11.5–15.5)
WBC Count: 8.3 10*3/uL (ref 4.0–10.5)
nRBC: 0 % (ref 0.0–0.2)

## 2021-01-05 LAB — CMP (CANCER CENTER ONLY)
ALT: 66 U/L — ABNORMAL HIGH (ref 0–44)
AST: 59 U/L — ABNORMAL HIGH (ref 15–41)
Albumin: 3.8 g/dL (ref 3.5–5.0)
Alkaline Phosphatase: 98 U/L (ref 38–126)
Anion gap: 13 (ref 5–15)
BUN: 7 mg/dL (ref 6–20)
CO2: 25 mmol/L (ref 22–32)
Calcium: 8.6 mg/dL — ABNORMAL LOW (ref 8.9–10.3)
Chloride: 102 mmol/L (ref 98–111)
Creatinine: 0.91 mg/dL (ref 0.61–1.24)
GFR, Estimated: >60 mL/min (ref >60)
Glucose, Bld: 275 mg/dL — ABNORMAL HIGH (ref 70–99)
Potassium: 3.6 mmol/L (ref 3.5–5.1)
Sodium: 140 mmol/L (ref 135–145)
Total Bilirubin: 0.4 mg/dL (ref 0.3–1.2)
Total Protein: 7.2 g/dL (ref 6.5–8.1)

## 2021-01-05 LAB — MAGNESIUM: Magnesium: 1.7 mg/dL (ref 1.7–2.4)

## 2021-01-05 LAB — CEA (ACCESS): CEA (CHCC): 5 ng/mL (ref 0.00–5.00)

## 2021-01-05 MED ORDER — SODIUM CHLORIDE 0.9 % IV SOLN: Freq: Once | INTRAVENOUS | Status: AC

## 2021-01-05 MED ORDER — PALONOSETRON HCL INJECTION 0.25 MG/5ML
0.2500 mg | Freq: Once | INTRAVENOUS | Status: AC
  Administered 2021-01-05: 0.25 mg via INTRAVENOUS
  Filled 2021-01-05: qty 5

## 2021-01-05 MED ORDER — SODIUM CHLORIDE 0.9 % IV SOLN
180.0000 mg/m2 | Freq: Once | INTRAVENOUS | Status: AC
  Administered 2021-01-05: 420 mg via INTRAVENOUS
  Filled 2021-01-05: qty 15

## 2021-01-05 MED ORDER — HEPARIN SOD (PORK) LOCK FLUSH 100 UNIT/ML IV SOLN
500.0000 [IU] | Freq: Once | INTRAVENOUS | Status: AC | PRN
  Administered 2021-01-05: 500 [IU]

## 2021-01-05 MED ORDER — ATROPINE SULFATE 1 MG/ML IJ SOLN
0.4000 mg | Freq: Once | INTRAMUSCULAR | Status: AC
  Administered 2021-01-05: 0.4 mg via INTRAVENOUS
  Filled 2021-01-05: qty 1

## 2021-01-05 MED ORDER — SODIUM CHLORIDE 0.9% FLUSH
10.0000 mL | INTRAVENOUS | Status: DC | PRN
  Administered 2021-01-05: 10 mL

## 2021-01-05 MED ORDER — PROMETHAZINE HCL 12.5 MG PO TABS: 12.5000 mg | ORAL_TABLET | Freq: Four times a day (QID) | ORAL | 2 refills | Status: DC | PRN

## 2021-01-05 MED ORDER — DEXAMETHASONE SODIUM PHOSPHATE 10 MG/ML IJ SOLN
5.0000 mg | Freq: Once | INTRAMUSCULAR | Status: AC
  Administered 2021-01-05: 5 mg via INTRAVENOUS
  Filled 2021-01-05: qty 1

## 2021-01-05 MED ORDER — SODIUM CHLORIDE 0.9 % IV SOLN
6.0000 mg/kg | Freq: Once | INTRAVENOUS | Status: AC
  Administered 2021-01-05: 700 mg via INTRAVENOUS
  Filled 2021-01-05: qty 20

## 2021-01-05 MED ORDER — SODIUM CHLORIDE 0.9 % IV SOLN
150.0000 mg | Freq: Once | INTRAVENOUS | Status: AC
  Administered 2021-01-05: 150 mg via INTRAVENOUS
  Filled 2021-01-05: qty 5

## 2021-01-05 NOTE — Progress Notes (Signed)
[facility] OFFICE PROGRESS NOTE   Diagnosis: Colon cancer  INTERVAL HISTORY:   Jeff [name] completed another cycle of irinotecan/panitumumab on 12/22/2020.  Minimal nausea following this cycle of chemotherapy.  No diarrhea.  Stable skin rash.  He has dryness and peeling of the skin at the hands and feet.  He is using moisturizers.  Objective:  Vital signs in last 24 hours:  Blood pressure 140/80, pulse (!) 107, temperature 97.9 F (36.6 C), temperature source Oral, resp. rate 18, height $RemoveBe'5\' 10"'BacVHPMrT$  (1.778 m), weight 242 lb 9.6 oz (110 kg), SpO2 99 %.    HEENT: No thrush or ulcers Resp: Lungs clear bilaterally Cardio: Regular rate and rhythm GI: No hepatosplenomegaly, no mass, nontender Vascular: No leg edema  Skin: Acne type rash over the face, chest, and back.  Dryness of the palms, dryness with superficial desquamation at the soles  Portacath/PICC-without erythema  Lab Results:  Lab Results  Component Value Date   WBC 8.3 01/05/2021   HGB 12.6 (L) 01/05/2021   HCT 41.2 01/05/2021   MCV 85.3 01/05/2021   PLT 196 01/05/2021   NEUTROABS 5.0 01/05/2021    CMP  Lab Results  Component Value Date   NA 140 12/22/2020   K 3.6 12/22/2020   CL 102 12/22/2020   CO2 26 12/22/2020   GLUCOSE 174 (H) 12/22/2020   BUN 9 12/22/2020   CREATININE 1.21 12/22/2020   CALCIUM 8.8 (L) 12/22/2020   PROT 7.2 12/22/2020   ALBUMIN 4.1 12/22/2020   AST 35 12/22/2020   ALT 58 (H) 12/22/2020   ALKPHOS 91 12/22/2020   BILITOT 0.4 12/22/2020   GFRNONAA >60 12/22/2020   GFRAA >60 01/30/2020    Lab Results  Component Value Date   CEA1 3.19 09/10/2020   CEA 4.82 12/22/2020      Medications: I have reviewed the patient's current medications.   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, biopsy confirmed adenocarcinoma         CTs chest, abdomen, and pelvis on 12/11/2015-indeterminate tiny pulmonary nodules,  multiple liver metastases, small nodes in the sigmoid mesocolon Laparoscopic sigmoid colectomy, liver biopsy, Port-A-Cath placement 01/14/2016 Pathology sigmoid colon resection- colonic adenocarcinoma, 5.3 cm extending into pericolonic connective tissue, positive lymph-vascular invasion, positive perineural invasion, negative margins, metastatic carcinoma in 9 of 28 lymph nodes Pathology liver biopsy-metastatic colorectal adenocarcinoma MSI stable; mismatch repair protein normal APC alteration, K RAS wild-type, no BRAF mutation Cycle 1 FOLFOX 02/02/2016 Cycle 2 FOLFOX 02/15/2016 Cycle 3 FOLFOX 02/29/2016 Cycle 4 FOLFOX 03/14/2016 Cycle 5 FOLFOX 03/28/2016 Cycle 6 FOLFOX 04/11/2016 (oxaliplatin held secondary to thrombocytopenia) 04/21/2016 restaging CTs-difficulty evaluating liver lesions due to hepatic steatosis. Stable right adrenal nodule. No adenopathy or local recurrence near the rectosigmoid anastomotic site. Cycle 7 FOLFOX 04/25/2016 MRI liver 05/02/2016-partial improvement in hepatic metastases Cycle 8 FOLFOX 05/10/2016 Cycle 9 FOLFOX 05/23/2016 (oxaliplatin held due to thrombocytopenia) Cycle 10 FOLFOX 06/06/2016 (oxaliplatin dose reduced due to thrombocytopenia) Cycle 11 FOLFOX 06/27/2016 (oxaliplatin held due to neuropathy) Cycle 12 FOLFOX 07/11/2016 (oxaliplatin held) Initiation of maintenance Xeloda 7 days on/7 days off 07/27/2016 MRI liver 11/18/2016-decrease in hepatic metastatic disease. No new or progressive disease identified within the abdomen. Continuation of Xeloda 7 days on/7 days off MRI liver 04/27/2017-previous liver lesions not identified, no new lesions, no lymphadenopathy Xeloda continued 7 days on/7 days off MRI liver 12/04/2017 - no evidence of metastatic disease, hepatic steatosis Xeloda continued 7 days on/7 days off MRI liver 07/15/2018-  no evidence of metastatic disease.  Stable severe hepatic steatosis. Xeloda continued 7 days on/7 days off MRI liver  03/16/2019-hepatic steatosis, no liver mass, focal area of intrahepatic biliary dilatation in segments 2 and 3 of the left lobe-increased Xeloda continued 7 days on/7 days off MRI abdomen 08/19/2019-no findings to suggest liver metastases.  Bilateral lung nodules measuring up to 2.8 cm, progressive and more conspicuous than on previous exam CT chest 08/29/2019-multiple pulmonary metastases, new from 04/21/2016 Cycle 1 FOLFIRI/bevacizumab 09/09/2019 Cycle 2 FOLFIRI/bevacizumab 09/26/2019  Cycle 3 FOLFIRI/bevacizumab 10/10/2019 Cycle 4 FOLFIRI/bevacizumab 10/24/2019 Cycle 5 FOLFIRI/bevacizumab 11/07/2019 CT chest 11/14/2019-decreased size of lung nodules, no new lesions, hepatic steatosis Cycle 6 FOLFIRI/bevacizumab 11/21/2019 Cycle 7 FOLFIRI/bevacizumab 12/05/2019 Cycle 8 FOLFIRI/bevacizumab 12/19/2019 Cycle 9 FOLFIRI/bevacizumab 01/02/2020 Cycle 10 FOLFIRI/bevacizumab 01/16/2020 CT chest 01/29/2020-stable bilateral pulmonary metastases.  No new or progressive metastatic disease in the chest. Cycle 11 FOLFIRI/bevacizumab 01/30/2020 Cycle 12 FOLFIRI/bevacizumab 02/19/2020 Cycle 13 FOLFIRI/bevacizumab 03/12/2020 Cycle 14 FOLFIRI/bevacizumab 04/02/2020 Cycle 15 FOLFIRI/bevacizumab 04/23/2020 Cycle 16 FOLFIRI/bevacizumab 05/21/2020 CT chest 06/09/2020-mild progression pulmonary metastasis.  Some lesions have increased in size while others are similar. Cycle 1 irinotecan/Panitumumab 06/18/2020 Cycle 2 irinotecan/Panitumumab 07/02/2020 Cycle 3 irinotecan/panitumumab 07/16/2020 Cycle 4 irinotecan/Panitumumab 07/30/2020 Cycle 5 irinotecan/Panitumumab 08/13/2020, Emend added Cycle 6 irinotecan/Panitumumab 08/27/2020 CT chest 09/08/2020-decreased size of pulmonary nodules, no progressive disease Cycle 7 irinotecan/panitumumab 09/02/2020 Cycle 8 irinotecan/panitumumab 09/24/2020 Cycle 9 irinotecan/Panitumumab 10/08/2020 10/22/2020 treatment held due to left foot fracture, need for surgery Cycle 10 irinotecan/panitumumab  11/24/2020 Cycle 11 irinotecan/Panitumumab 12/08/2020 Cycle 12 irinotecan/panitumumab 12/22/2020 Cycle 13 irinotecan/panitumumab 01/05/2021   2.   Rectal bleeding and constipation secondary to #1   3.   History of peripheral vascular disease, status post left lower extremity vascular bypass surgery in April 2017   4.   History of nephrolithiasis   5.   History of Graves' disease treated with radioactive iodine   6.   Anxiety/depression   7.   Hypertension   8.   Hospitalization 01/19/2016 with wound dehiscence status post secondary suture closure of abdominal wall   9.   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 Hyperglycemia 06/20/2016-diagnosed with diabetes, maintained on insulin   11.  Positive COVID test 12/13/2018   12. Hospitalized with seizure activity/DKA. Now on Keppra, insulin adjusted. 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  Left foot fracture-surgical repair 11/11/2020       Disposition: Mr. Lemme appears stable.  He will complete another treatment with irinotecan and panitumumab today.  He continues to tolerate treatment well.  He will undergo a restaging CT prior to an office visit on 01/26/2021.  I encouraged him to use moisturizers on the feet.  Betsy Coder, MD  01/05/2021  10:53 AM

## 2021-01-05 NOTE — Patient Instructions (Signed)
[facility]  Discharge Instructions: Thank you for choosing [facility] to provide your oncology and hematology care.   If you have a lab appointment with the [facility], please go directly to the [facility] and check in at the registration area.   Wear comfortable clothing and clothing appropriate for easy access to any Portacath or PICC line.   We strive to give you quality time with your provider. You may need to reschedule your appointment if you arrive late (15 or more minutes).  Arriving late affects you and other patients whose appointments are after yours.  Also, if you miss three or more appointments without notifying the office, you may be dismissed from the clinic at the provider's discretion.      For prescription refill requests, have your pharmacy contact our office and allow 72 hours for refills to be completed.    Today you received the following chemotherapy and/or immunotherapy agents panitumumab, irinotecan    To help prevent nausea and vomiting after your treatment, we encourage you to take your nausea medication as directed.  BELOW ARE SYMPTOMS THAT SHOULD BE REPORTED IMMEDIATELY: *FEVER GREATER THAN 100.4 F (38 C) OR HIGHER *CHILLS OR SWEATING *NAUSEA AND VOMITING THAT IS NOT CONTROLLED WITH YOUR NAUSEA MEDICATION *UNUSUAL SHORTNESS OF BREATH *UNUSUAL BRUISING OR BLEEDING *URINARY PROBLEMS (pain or burning when urinating, or frequent urination) *BOWEL PROBLEMS (unusual diarrhea, constipation, pain near the anus) TENDERNESS IN MOUTH AND THROAT WITH OR WITHOUT PRESENCE OF ULCERS (sore throat, sores in mouth, or a toothache) UNUSUAL RASH, SWELLING OR PAIN  UNUSUAL VAGINAL DISCHARGE OR ITCHING   Items with * indicate a potential emergency and should be followed up as soon as possible or go to the Emergency Department if any problems should occur.  Please show the CHEMOTHERAPY ALERT CARD or IMMUNOTHERAPY ALERT CARD at  check-in to the Emergency Department and triage nurse.  Should you have questions after your visit or need to cancel or reschedule your appointment, please contact [facility]  Dept: [phone]  and follow the prompts.  Office hours are 8:00 a.m. to 4:30 p.m. Monday - Friday. Please note that voicemails left after 4:00 p.m. may not be returned until the following business day.  We are closed weekends and major holidays. You have access to a nurse at all times for urgent questions. Please call the main number to the clinic Dept: [phone] and follow the prompts.   For any non-urgent questions, you may also contact your provider using MyChart. We now offer e-Visits for anyone 50 and older to request care online for non-urgent symptoms. For details visit [email or web address].   Also download the MyChart app! Go to the app store, search "MyChart", open the app, select [facility], and log in with your MyChart username and password.  Due to Covid, a mask is required upon entering the hospital/clinic. If you do not have a mask, one will be given to you upon arrival. For doctor visits, patients may have 1 support person aged 68 or older with them. For treatment visits, patients cannot have anyone with them due to current Covid guidelines and our immunocompromised population.   Panitumumab Solution for Injection What is this medication? PANITUMUMAB (pan i TOOM ue mab) is a monoclonal antibody. It is used to treatcolorectal cancer. This medicine may be used for other purposes; ask your health care provider orpharmacist if you have questions. COMMON BRAND NAME(S): Vectibix What should I tell  my care team before I take this medication? They need to know if you have any of these conditions: eye disease, vision problems low levels of calcium, magnesium, or potassium in the blood lung or breathing disease, like asthma skin conditions or sensitivity an unusual or allergic  reaction to panitumumab, other medicines, foods, dyes, or preservatives pregnant or trying to get pregnant breast-feeding How should I use this medication? This drug is given as an infusion into a vein. It is administered in a hospitalor clinic by a specially trained health care professional. Talk to your pediatrician regarding the use of this medicine in children.Special care may be needed. Overdosage: If you think you have taken too much of this medicine contact apoison control center or emergency room at once. NOTE: This medicine is only for you. Do not share this medicine with others. What if I miss a dose? It is important not to miss your dose. Call your doctor or health careprofessional if you are unable to keep an appointment. What may interact with this medication? Do not take this medicine with any of the following medications: bevacizumab This list may not describe all possible interactions. Give your health care provider a list of all the medicines, herbs, non-prescription drugs, or dietary supplements you use. Also tell them if you smoke, drink alcohol, or use illegaldrugs. Some items may interact with your medicine. What should I watch for while using this medication? Visit your doctor for checks on your progress. This drug may make you feel generally unwell. This is not uncommon, as chemotherapy can affect healthy cells as well as cancer cells. Report any side effects. Continue your course oftreatment even though you feel ill unless your doctor tells you to stop. This medicine can make you more sensitive to the sun. Keep out of the sun while receiving this medicine and for 2 months after the last dose. If you cannot avoid being in the sun, wear protective clothing and use sunscreen. Do not usesun lamps or tanning beds/booths. In some cases, you may be given additional medicines to help with side effects.Follow all directions for their use. Call your doctor or health care professional  for advice if you get a fever, chills or sore throat, or other symptoms of a cold or flu. Do not treat yourself. This drug decreases your body's ability to fight infections. Try toavoid being around people who are sick. Avoid taking products that contain aspirin, acetaminophen, ibuprofen, naproxen, or ketoprofen unless instructed by your doctor. These medicines may hide afever. Do not become pregnant while taking this medicine and for 2 months after the last dose. Women should inform their doctor if they wish to become pregnant or think they might be pregnant. There is a potential for serious side effects to an unborn child. Talk to your health care professional or pharmacist for more information. Do not breast-feed an infant while taking this medicine or for 49month after the last dose. What side effects may I notice from receiving this medication? Side effects that you should report to your doctor or health care professionalas soon as possible: allergic reactions like skin rash, itching or hives, swelling of the face, lips, or tongue breathing problems changes in vision eye pain fast, irregular heartbeat fever, chills mouth sores red spots on the skin redness, blistering, peeling or loosening of the skin, including inside the mouth signs and symptoms of kidney injury like trouble passing urine or change in the amount of urine signs and symptoms of low blood pressure  like dizziness; feeling faint or lightheaded, falls; unusually weak or tired signs of low calcium like fast heartbeat, muscle cramps or muscle pain; pain, tingling, numbness in the hands or feet; seizures signs and symptoms of low magnesium like muscle cramps, pain, or weakness; tremors; seizures; or fast, irregular heartbeat signs and symptoms of low potassium like muscle cramps or muscle pain; chest pain; dizziness; feeling faint or lightheaded, falls; palpitations; breathing problems; or fast, irregular heartbeat swelling of the  ankles, feet, hands Side effects that usually do not require medical attention (report to yourdoctor or health care professional if they continue or are bothersome): changes in skin like acne, cracks, skin dryness diarrhea eyelash growth headache mouth sores nail changes nausea, vomiting This list may not describe all possible side effects. Call your doctor for medical advice about side effects. You may report side effects to FDA at1-800-FDA-1088. Where should I keep my medication? This drug is given in a hospital or clinic and will not be stored at home. NOTE: This sheet is a summary. It may not cover all possible information. If you have questions about this medicine, talk to your doctor, pharmacist, orhealth care provider.  2022 Elsevier/Gold Standard (2015-11-20 16:45:04)  Irinotecan injection What is this medication? IRINOTECAN (ir in oh TEE kan ) is a chemotherapy drug. It is used to treatcolon and rectal cancer. This medicine may be used for other purposes; ask your health care provider orpharmacist if you have questions. COMMON BRAND NAME(S): Camptosar What should I tell my care team before I take this medication? They need to know if you have any of these conditions: dehydration diarrhea infection (especially a virus infection such as chickenpox, cold sores, or herpes) liver disease low blood counts, like low white cell, platelet, or red cell counts low levels of calcium, magnesium, or potassium in the blood recent or ongoing radiation therapy an unusual or allergic reaction to irinotecan, other medicines, foods, dyes, or preservatives pregnant or trying to get pregnant breast-feeding How should I use this medication? This drug is given as an infusion into a vein. It is administered in a hospitalor clinic by a specially trained health care professional. Talk to your pediatrician regarding the use of this medicine in children.Special care may be needed. Overdosage: If you  think you have taken too much of this medicine contact apoison control center or emergency room at once. NOTE: This medicine is only for you. Do not share this medicine with others. What if I miss a dose? It is important not to miss your dose. Call your doctor or health careprofessional if you are unable to keep an appointment. What may interact with this medication? Do not take this medicine with any of the following medications: cobicistat itraconazole This medicine may interact with the following medications: antiviral medicines for HIV or AIDS certain antibiotics like rifampin or rifabutin certain medicines for fungal infections like ketoconazole, posaconazole, and voriconazole certain medicines for seizures like carbamazepine, phenobarbital, phenotoin clarithromycin gemfibrozil nefazodone [facility]. John's Wort This list may not describe all possible interactions. Give your health care provider a list of all the medicines, herbs, non-prescription drugs, or dietary supplements you use. Also tell them if you smoke, drink alcohol, or use illegaldrugs. Some items may interact with your medicine. What should I watch for while using this medication? Your condition will be monitored carefully while you are receiving this medicine. You will need important blood work done while you are taking thismedicine. This drug may make you feel generally unwell. This  is not uncommon, as chemotherapy can affect healthy cells as well as cancer cells. Report any side effects. Continue your course of treatment even though you feel ill unless yourdoctor tells you to stop. In some cases, you may be given additional medicines to help with side effects.Follow all directions for their use. You may get drowsy or dizzy. Do not drive, use machinery, or do anything that needs mental alertness until you know how this medicine affects you. Do not stand or sit up quickly, especially if you are an older patient. This reducesthe risk  of dizzy or fainting spells. Call your health care professional for advice if you get a fever, chills, or sore throat, or other symptoms of a cold or flu. Do not treat yourself. This medicine decreases your body's ability to fight infections. Try to avoid beingaround people who are sick. Avoid taking products that contain aspirin, acetaminophen, ibuprofen, naproxen, or ketoprofen unless instructed by your doctor. These medicines may hide afever. This medicine may increase your risk to bruise or bleed. Call your doctor orhealth care professional if you notice any unusual bleeding. Be careful brushing and flossing your teeth or using a toothpick because you may get an infection or bleed more easily. If you have any dental work done,tell your dentist you are receiving this medicine. Do not become pregnant while taking this medicine or for 6 months after stopping it. Women should inform their health care professional if they wish to become pregnant or think they might be pregnant. Men should not father a child while taking this medicine and for 3 months after stopping it. There is potential for serious side effects to an unborn child. Talk to your health careprofessional for more information. Do not breast-feed an infant while taking this medicine or for 7 days afterstopping it. This medicine has caused ovarian failure in some women. This medicine may make it more difficult to get pregnant. Talk to your health care professional if [place] concerned about your fertility. This medicine has caused decreased sperm counts in some men. This may make it more difficult to father a child. Talk to your health care professional if [place] concerned about your fertility. What side effects may I notice from receiving this medication? Side effects that you should report to your doctor or health care professionalas soon as possible: allergic reactions like skin rash, itching or hives, swelling of the face, lips, or  tongue chest pain diarrhea flushing, runny nose, sweating during infusion low blood counts - this medicine may decrease the number of white blood cells, red blood cells and platelets. You may be at increased risk for infections and bleeding. nausea, vomiting pain, swelling, warmth in the leg signs of decreased platelets or bleeding - bruising, pinpoint red spots on the skin, black, tarry stools, blood in the urine signs of infection - fever or chills, cough, sore throat, pain or difficulty passing urine signs of decreased red blood cells - unusually weak or tired, fainting spells, lightheadedness Side effects that usually do not require medical attention (report to yourdoctor or health care professional if they continue or are bothersome): constipation hair loss headache loss of appetite mouth sores stomach pain This list may not describe all possible side effects. Call your doctor for medical advice about side effects. You may report side effects to FDA at1-800-FDA-1088. Where should I keep my medication? This drug is given in a hospital or clinic and will not be stored at home. NOTE: This sheet is a summary. It may  not cover all possible information. If you have questions about this medicine, talk to your doctor, pharmacist, orhealth care provider.  2022 Elsevier/Gold Standard (2019-04-02 17:46:13)

## 2021-01-09 DIAGNOSIS — R400 Somnolence: Secondary | ICD-10-CM | POA: Diagnosis not present

## 2021-01-09 NOTE — Procedures (Signed)
     Patient Name: Jeff [name], Jeff [name] Date: 12/29/2020 Gender: Male D.O.B: 1965/10/30 Age (years): 55 Referring Provider: Cameron Sprang Height (inches): 21 Interpreting Physician: Baird Lyons MD, ABSM Weight (lbs): 238 RPSGT: Jacolyn Reedy BMI: 34 MRN: [id number] Neck Size: <br>  CLINICAL INFORMATION Sleep Study Type: HST Indication for sleep study: Insomnia Epworth Sleepiness Score: 4  SLEEP STUDY TECHNIQUE A multi-channel overnight portable sleep study was performed. The channels recorded were: nasal airflow, thoracic respiratory movement, and oxygen saturation with a pulse oximetry. Snoring was also monitored.  MEDICATIONS Patient self administered medications include: N/A.  SLEEP ARCHITECTURE Patient was studied for 618.7 minutes. The sleep efficiency was 100.0 % and the patient was supine for 0%. The arousal index was 0.0 per hour.  RESPIRATORY PARAMETERS The overall AHI was 8.0 per hour, with a central apnea index of 0 per hour. The oxygen nadir was 87% during sleep.  CARDIAC DATA Mean heart rate during sleep was 95.8 bpm.  IMPRESSIONS - Mild obstructive sleep apnea occurred during this study (AHI = 8.0/h). - Mild oxygen desaturation was noted during this study (Min O2 = 87%). Mean O2 saturation 97%. - Patient snored - Sleep appeared fragmented with frequent arousal and awakening, parly attributable to respiratory events.. Better assessment would likely be available through a formal, in-center polysomnogram.  DIAGNOSIS - Obstructive Sleep Apnea (G47.33)  RECOMMENDATIONS - Conservative options may include observation, weight loss and sleep position off back. Other options, espcially including CPAP or a fitted oral appliance may be alternatives. - Be careful with alcohol, sedatives and other CNS depressants that may worsen sleep apnea and disrupt normal sleep architecture. - Sleep hygiene should be reviewed to assess factors that may improve sleep  quality. - Weight management and regular exercise should be initiated or continued. - Patient may benefit from in-lab study  [Electronically signed] 01/09/2021 11:21 AM  Baird Lyons MD, ABSM Diplomate, American Board of Sleep Medicine   NPI: [id number]                        [facility] [place] [facility]  ELECTRONICALLY SIGNED ON:  01/09/2021, 11:13 AM [place] PH: [phone]   FX: [phone] [place]

## 2021-01-20 ENCOUNTER — Ambulatory Visit (HOSPITAL_BASED_OUTPATIENT_CLINIC_OR_DEPARTMENT_OTHER)
Admission: RE | Admit: 2021-01-20 | Discharge: 2021-01-20 | Disposition: A | Discharge status: Home / Self Care | Payer: Medicaid Other | Source: Ambulatory Visit | Attending: Oncology | Admitting: Oncology

## 2021-01-20 ENCOUNTER — Other Ambulatory Visit: Payer: Self-pay

## 2021-01-20 DIAGNOSIS — C187 Malignant neoplasm of sigmoid colon: Secondary | ICD-10-CM | POA: Insufficient documentation

## 2021-01-23 ENCOUNTER — Other Ambulatory Visit: Payer: Self-pay | Admitting: Oncology

## 2021-01-25 ENCOUNTER — Telehealth: Payer: Self-pay | Admitting: *Deleted

## 2021-01-25 NOTE — Telephone Encounter (Signed)
Wife left VM that "it's been going on for 3 days and his sputum is clear". Called back and left wife VM asking for specifics on what has been going on. Does he have fever, short of breath, chest pain, what has he taken for symptoms, has he covid tested? Requested return call and ask for [facility]

## 2021-01-26 ENCOUNTER — Telehealth: Payer: Self-pay | Admitting: Nurse Practitioner

## 2021-01-26 ENCOUNTER — Inpatient Hospital Stay: Payer: Medicaid Other

## 2021-01-26 ENCOUNTER — Inpatient Hospital Stay: Payer: Medicaid Other | Admitting: Nurse Practitioner

## 2021-01-26 ENCOUNTER — Telehealth: Payer: Self-pay

## 2021-01-26 NOTE — Telephone Encounter (Signed)
Rescheduled appt from 9/13 due to covid. Patient wife is aware of appt date and time

## 2021-01-26 NOTE — Telephone Encounter (Signed)
called spoke with wife pt is covid positive made aware pt will need 10 days and no sx before next appt

## 2021-01-27 ENCOUNTER — Telehealth: Payer: Self-pay

## 2021-01-27 DIAGNOSIS — G4730 Sleep apnea, unspecified: Secondary | ICD-10-CM

## 2021-01-27 NOTE — Telephone Encounter (Signed)
Spoke with pt wife and informed her sleep study showed mild sleep apnea, Dr Delice Lesch would like him to see the sleep specialist to discuss recommendations. Order for referral placed in [place]

## 2021-01-27 NOTE — Progress Notes (Signed)
Pls let him know the sleep study showed mild sleep apnea, I would like him to see the sleep specialist to discuss recommendations. Pls refer to Pulmonary, dx mild sleep apnea, thanks

## 2021-01-27 NOTE — Telephone Encounter (Signed)
-----   Message from Cameron Sprang, MD sent at 01/27/2021 10:45 AM EDT -----    ----- Message ----- From: Deneise Lever, MD Sent: 01/09/2021  11:24 AM EDT To: Cameron Sprang, MD

## 2021-01-28 ENCOUNTER — Encounter: Payer: Medicaid Other | Admitting: Podiatry

## 2021-02-05 ENCOUNTER — Other Ambulatory Visit: Payer: Self-pay

## 2021-02-05 ENCOUNTER — Inpatient Hospital Stay: Payer: Medicaid Other | Attending: Oncology

## 2021-02-05 ENCOUNTER — Encounter: Payer: Self-pay | Admitting: Nurse Practitioner

## 2021-02-05 ENCOUNTER — Inpatient Hospital Stay: Payer: Medicaid Other

## 2021-02-05 ENCOUNTER — Inpatient Hospital Stay (HOSPITAL_BASED_OUTPATIENT_CLINIC_OR_DEPARTMENT_OTHER): Payer: Medicaid Other | Admitting: Nurse Practitioner

## 2021-02-05 VITALS — BP 134/80 | HR 108 | Temp 98.2°F | Resp 18 | Ht 70.0 in | Wt 243.2 lb

## 2021-02-05 DIAGNOSIS — C787 Secondary malignant neoplasm of liver and intrahepatic bile duct: Secondary | ICD-10-CM | POA: Diagnosis not present

## 2021-02-05 DIAGNOSIS — C7802 Secondary malignant neoplasm of left lung: Secondary | ICD-10-CM | POA: Insufficient documentation

## 2021-02-05 DIAGNOSIS — C189 Malignant neoplasm of colon, unspecified: Secondary | ICD-10-CM

## 2021-02-05 DIAGNOSIS — Z5112 Encounter for antineoplastic immunotherapy: Secondary | ICD-10-CM | POA: Diagnosis not present

## 2021-02-05 DIAGNOSIS — C7801 Secondary malignant neoplasm of right lung: Secondary | ICD-10-CM | POA: Diagnosis not present

## 2021-02-05 DIAGNOSIS — Z5111 Encounter for antineoplastic chemotherapy: Secondary | ICD-10-CM | POA: Insufficient documentation

## 2021-02-05 DIAGNOSIS — C187 Malignant neoplasm of sigmoid colon: Secondary | ICD-10-CM | POA: Insufficient documentation

## 2021-02-05 LAB — CMP (CANCER CENTER ONLY)
ALT: 35 U/L (ref 0–44)
AST: 38 U/L (ref 15–41)
Albumin: 4 g/dL (ref 3.5–5.0)
Alkaline Phosphatase: 85 U/L (ref 38–126)
Anion gap: 14 (ref 5–15)
BUN: 8 mg/dL (ref 6–20)
CO2: 25 mmol/L (ref 22–32)
Calcium: 9 mg/dL (ref 8.9–10.3)
Chloride: 98 mmol/L (ref 98–111)
Creatinine: 1.07 mg/dL (ref 0.61–1.24)
GFR, Estimated: >60 mL/min (ref >60)
Glucose, Bld: 386 mg/dL — ABNORMAL HIGH (ref 70–99)
Potassium: 3.2 mmol/L — ABNORMAL LOW (ref 3.5–5.1)
Sodium: 137 mmol/L (ref 135–145)
Total Bilirubin: 0.5 mg/dL (ref 0.3–1.2)
Total Protein: 7.1 g/dL (ref 6.5–8.1)

## 2021-02-05 LAB — CBC WITH DIFFERENTIAL (CANCER CENTER ONLY)
Abs Immature Granulocytes: 0.03 10*3/uL (ref 0.00–0.07)
Basophils Absolute: 0 10*3/uL (ref 0.0–0.1)
Basophils Relative: 0 %
Eosinophils Absolute: 0.2 10*3/uL (ref 0.0–0.5)
Eosinophils Relative: 3 %
HCT: 40.5 % (ref 39.0–52.0)
Hemoglobin: 12.4 g/dL — ABNORMAL LOW (ref 13.0–17.0)
Immature Granulocytes: 0 %
Lymphocytes Relative: 32 %
Lymphs Abs: 2.5 10*3/uL (ref 0.7–4.0)
MCH: 25.4 pg — ABNORMAL LOW (ref 26.0–34.0)
MCHC: 30.6 g/dL (ref 30.0–36.0)
MCV: 83 fL (ref 80.0–100.0)
Monocytes Absolute: 0.7 10*3/uL (ref 0.1–1.0)
Monocytes Relative: 8 %
Neutro Abs: 4.3 10*3/uL (ref 1.7–7.7)
Neutrophils Relative %: 57 %
Platelet Count: 200 10*3/uL (ref 150–400)
RBC: 4.88 MIL/uL (ref 4.22–5.81)
RDW: 16.8 % — ABNORMAL HIGH (ref 11.5–15.5)
WBC Count: 7.7 10*3/uL (ref 4.0–10.5)
nRBC: 0 % (ref 0.0–0.2)

## 2021-02-05 LAB — MAGNESIUM: Magnesium: 1.4 mg/dL — ABNORMAL LOW (ref 1.7–2.4)

## 2021-02-05 LAB — CEA (ACCESS): CEA (CHCC): 6.27 ng/mL — ABNORMAL HIGH (ref 0.00–5.00)

## 2021-02-05 MED ORDER — DEXAMETHASONE SODIUM PHOSPHATE 10 MG/ML IJ SOLN
5.0000 mg | Freq: Once | INTRAMUSCULAR | Status: AC
  Administered 2021-02-05: 5 mg via INTRAVENOUS
  Filled 2021-02-05: qty 1

## 2021-02-05 MED ORDER — SODIUM CHLORIDE 0.9 % IV SOLN
180.0000 mg/m2 | Freq: Once | INTRAVENOUS | Status: AC
  Administered 2021-02-05: 420 mg via INTRAVENOUS
  Filled 2021-02-05: qty 15

## 2021-02-05 MED ORDER — MAGNESIUM SULFATE 2 GM/50ML IV SOLN
2.0000 g | Freq: Once | INTRAVENOUS | Status: AC
  Administered 2021-02-05: 2 g via INTRAVENOUS
  Filled 2021-02-05: qty 50

## 2021-02-05 MED ORDER — SODIUM CHLORIDE 0.9 % IV SOLN
150.0000 mg | Freq: Once | INTRAVENOUS | Status: AC
  Administered 2021-02-05: 150 mg via INTRAVENOUS
  Filled 2021-02-05: qty 5

## 2021-02-05 MED ORDER — SODIUM CHLORIDE 0.9 % IV SOLN
6.0000 mg/kg | Freq: Once | INTRAVENOUS | Status: AC
  Administered 2021-02-05: 700 mg via INTRAVENOUS
  Filled 2021-02-05: qty 20

## 2021-02-05 MED ORDER — POTASSIUM CHLORIDE CRYS ER 20 MEQ PO TBCR: 20.0000 meq | EXTENDED_RELEASE_TABLET | Freq: Every day | ORAL | 1 refills | Status: DC

## 2021-02-05 MED ORDER — SODIUM CHLORIDE 0.9% FLUSH: 10.0000 mL | INTRAVENOUS | Status: DC | PRN

## 2021-02-05 MED ORDER — ATROPINE SULFATE 1 MG/ML IJ SOLN
0.4000 mg | Freq: Once | INTRAMUSCULAR | Status: AC
  Administered 2021-02-05: 0.4 mg via INTRAVENOUS
  Filled 2021-02-05: qty 1

## 2021-02-05 MED ORDER — SODIUM CHLORIDE 0.9 % IV SOLN: Freq: Once | INTRAVENOUS | Status: AC

## 2021-02-05 MED ORDER — PALONOSETRON HCL INJECTION 0.25 MG/5ML
0.2500 mg | Freq: Once | INTRAVENOUS | Status: AC
  Administered 2021-02-05: 0.25 mg via INTRAVENOUS
  Filled 2021-02-05: qty 5

## 2021-02-05 MED ORDER — HEPARIN SOD (PORK) LOCK FLUSH 100 UNIT/ML IV SOLN: 500.0000 [IU] | Freq: Once | INTRAVENOUS | Status: DC | PRN

## 2021-02-05 NOTE — Patient Instructions (Signed)
[facility]  Discharge Instructions: Thank you for choosing [facility] to provide your oncology and hematology care.   If you have a lab appointment with the [facility], please go directly to the [facility] and check in at the registration area.   Wear comfortable clothing and clothing appropriate for easy access to any Portacath or PICC line.   We strive to give you quality time with your provider. You may need to reschedule your appointment if you arrive late (15 or more minutes).  Arriving late affects you and other patients whose appointments are after yours.  Also, if you miss three or more appointments without notifying the office, you may be dismissed from the clinic at the provider's discretion.      For prescription refill requests, have your pharmacy contact our office and allow 72 hours for refills to be completed.    Today you received the following chemotherapy and/or immunotherapy agents Vectibix, Irinotecan   To help prevent nausea and vomiting after your treatment, we encourage you to take your nausea medication as directed.  BELOW ARE SYMPTOMS THAT SHOULD BE REPORTED IMMEDIATELY: *FEVER GREATER THAN 100.4 F (38 C) OR HIGHER *CHILLS OR SWEATING *NAUSEA AND VOMITING THAT IS NOT CONTROLLED WITH YOUR NAUSEA MEDICATION *UNUSUAL SHORTNESS OF BREATH *UNUSUAL BRUISING OR BLEEDING *URINARY PROBLEMS (pain or burning when urinating, or frequent urination) *BOWEL PROBLEMS (unusual diarrhea, constipation, pain near the anus) TENDERNESS IN MOUTH AND THROAT WITH OR WITHOUT PRESENCE OF ULCERS (sore throat, sores in mouth, or a toothache) UNUSUAL RASH, SWELLING OR PAIN  UNUSUAL VAGINAL DISCHARGE OR ITCHING   Items with * indicate a potential emergency and should be followed up as soon as possible or go to the Emergency Department if any problems should occur.  Please show the CHEMOTHERAPY ALERT CARD or IMMUNOTHERAPY ALERT CARD at check-in  to the Emergency Department and triage nurse.  Should you have questions after your visit or need to cancel or reschedule your appointment, please contact [facility]  Dept: [phone]  and follow the prompts.  Office hours are 8:00 a.m. to 4:30 p.m. Monday - Friday. Please note that voicemails left after 4:00 p.m. may not be returned until the following business day.  We are closed weekends and major holidays. You have access to a nurse at all times for urgent questions. Please call the main number to the clinic Dept: [phone] and follow the prompts.   For any non-urgent questions, you may also contact your provider using MyChart. We now offer e-Visits for anyone 7 and older to request care online for non-urgent symptoms. For details visit [email or web address].   Also download the MyChart app! Go to the app store, search "MyChart", open the app, select [facility], and log in with your MyChart username and password.  Due to Covid, a mask is required upon entering the hospital/clinic. If you do not have a mask, one will be given to you upon arrival. For doctor visits, patients may have 1 support person aged 25 or older with them. For treatment visits, patients cannot have anyone with them due to current Covid guidelines and our immunocompromised population.   Panitumumab Solution for Injection What is this medication? PANITUMUMAB (pan i TOOM ue mab) is a monoclonal antibody. It is used to treat colorectal cancer. This medicine may be used for other purposes; ask your health care provider or pharmacist if you have questions. COMMON BRAND NAME(S): Vectibix What should I  tell my care team before I take this medication? They need to know if you have any of these conditions: eye disease, vision problems low levels of calcium, magnesium, or potassium in the blood lung or breathing disease, like asthma skin conditions or sensitivity an unusual or allergic reaction  to panitumumab, other medicines, foods, dyes, or preservatives pregnant or trying to get pregnant breast-feeding How should I use this medication? This drug is given as an infusion into a vein. It is administered in a hospital or clinic by a specially trained health care professional. Talk to your pediatrician regarding the use of this medicine in children. Special care may be needed. Overdosage: If you think you have taken too much of this medicine contact a poison control center or emergency room at once. NOTE: This medicine is only for you. Do not share this medicine with others. What if I miss a dose? It is important not to miss your dose. Call your doctor or health care professional if you are unable to keep an appointment. What may interact with this medication? Do not take this medicine with any of the following medications: bevacizumab This list may not describe all possible interactions. Give your health care provider a list of all the medicines, herbs, non-prescription drugs, or dietary supplements you use. Also tell them if you smoke, drink alcohol, or use illegal drugs. Some items may interact with your medicine. What should I watch for while using this medication? Visit your doctor for checks on your progress. This drug may make you feel generally unwell. This is not uncommon, as chemotherapy can affect healthy cells as well as cancer cells. Report any side effects. Continue your course of treatment even though you feel ill unless your doctor tells you to stop. This medicine can make you more sensitive to the sun. Keep out of the sun while receiving this medicine and for 2 months after the last dose. If you cannot avoid being in the sun, wear protective clothing and use sunscreen. Do not use sun lamps or tanning beds/booths. In some cases, you may be given additional medicines to help with side effects. Follow all directions for their use. Call your doctor or health care professional  for advice if you get a fever, chills or sore throat, or other symptoms of a cold or flu. Do not treat yourself. This drug decreases your body's ability to fight infections. Try to avoid being around people who are sick. Avoid taking products that contain aspirin, acetaminophen, ibuprofen, naproxen, or ketoprofen unless instructed by your doctor. These medicines may hide a fever. Do not become pregnant while taking this medicine and for 2 months after the last dose. Women should inform their doctor if they wish to become pregnant or think they might be pregnant. There is a potential for serious side effects to an unborn child. Talk to your health care professional or pharmacist for more information. Do not breast-feed an infant while taking this medicine or for 2 months after the last dose. What side effects may I notice from receiving this medication? Side effects that you should report to your doctor or health care professional as soon as possible: allergic reactions like skin rash, itching or hives, swelling of the face, lips, or tongue breathing problems changes in vision eye pain fast, irregular heartbeat fever, chills mouth sores red spots on the skin redness, blistering, peeling or loosening of the skin, including inside the mouth signs and symptoms of kidney injury like trouble passing urine or  change in the amount of urine signs and symptoms of low blood pressure like dizziness; feeling faint or lightheaded, falls; unusually weak or tired signs of low calcium like fast heartbeat, muscle cramps or muscle pain; pain, tingling, numbness in the hands or feet; seizures signs and symptoms of low magnesium like muscle cramps, pain, or weakness; tremors; seizures; or fast, irregular heartbeat signs and symptoms of low potassium like muscle cramps or muscle pain; chest pain; dizziness; feeling faint or lightheaded, falls; palpitations; breathing problems; or fast, irregular heartbeat swelling of  the ankles, feet, hands Side effects that usually do not require medical attention (report to your doctor or health care professional if they continue or are bothersome): changes in skin like acne, cracks, skin dryness diarrhea eyelash growth headache mouth sores nail changes nausea, vomiting This list may not describe all possible side effects. Call your doctor for medical advice about side effects. You may report side effects to FDA at 1-800-FDA-1088. Where should I keep my medication? This drug is given in a hospital or clinic and will not be stored at home. NOTE: This sheet is a summary. It may not cover all possible information. If you have questions about this medicine, talk to your doctor, pharmacist, or health care provider.  2022 Elsevier/Gold Standard (2015-11-20 16:45:04)  Irinotecan injection What is this medication? IRINOTECAN (ir in oh TEE kan ) is a chemotherapy drug. It is used to treat colon and rectal cancer. This medicine may be used for other purposes; ask your health care provider or pharmacist if you have questions. COMMON BRAND NAME(S): Camptosar What should I tell my care team before I take this medication? They need to know if you have any of these conditions: dehydration diarrhea infection (especially a virus infection such as chickenpox, cold sores, or herpes) liver disease low blood counts, like low white cell, platelet, or red cell counts low levels of calcium, magnesium, or potassium in the blood recent or ongoing radiation therapy an unusual or allergic reaction to irinotecan, other medicines, foods, dyes, or preservatives pregnant or trying to get pregnant breast-feeding How should I use this medication? This drug is given as an infusion into a vein. It is administered in a hospital or clinic by a specially trained health care professional. Talk to your pediatrician regarding the use of this medicine in children. Special care may be  needed. Overdosage: If you think you have taken too much of this medicine contact a poison control center or emergency room at once. NOTE: This medicine is only for you. Do not share this medicine with others. What if I miss a dose? It is important not to miss your dose. Call your doctor or health care professional if you are unable to keep an appointment. What may interact with this medication? Do not take this medicine with any of the following medications: cobicistat itraconazole This medicine may interact with the following medications: antiviral medicines for HIV or AIDS certain antibiotics like rifampin or rifabutin certain medicines for fungal infections like ketoconazole, posaconazole, and voriconazole certain medicines for seizures like carbamazepine, phenobarbital, phenotoin clarithromycin gemfibrozil nefazodone St. John's Wort This list may not describe all possible interactions. Give your health care provider a list of all the medicines, herbs, non-prescription drugs, or dietary supplements you use. Also tell them if you smoke, drink alcohol, or use illegal drugs. Some items may interact with your medicine. What should I watch for while using this medication? Your condition will be monitored carefully while you are receiving  this medicine. You will need important blood work done while you are taking this medicine. This drug may make you feel generally unwell. This is not uncommon, as chemotherapy can affect healthy cells as well as cancer cells. Report any side effects. Continue your course of treatment even though you feel ill unless your doctor tells you to stop. In some cases, you may be given additional medicines to help with side effects. Follow all directions for their use. You may get drowsy or dizzy. Do not drive, use machinery, or do anything that needs mental alertness until you know how this medicine affects you. Do not stand or sit up quickly, especially if you are an  older patient. This reduces the risk of dizzy or fainting spells. Call your health care professional for advice if you get a fever, chills, or sore throat, or other symptoms of a cold or flu. Do not treat yourself. This medicine decreases your body's ability to fight infections. Try to avoid being around people who are sick. Avoid taking products that contain aspirin, acetaminophen, ibuprofen, naproxen, or ketoprofen unless instructed by your doctor. These medicines may hide a fever. This medicine may increase your risk to bruise or bleed. Call your doctor or health care professional if you notice any unusual bleeding. Be careful brushing and flossing your teeth or using a toothpick because you may get an infection or bleed more easily. If you have any dental work done, tell your dentist you are receiving this medicine. Do not become pregnant while taking this medicine or for 6 months after stopping it. Women should inform their health care professional if they wish to become pregnant or think they might be pregnant. Men should not father a child while taking this medicine and for 3 months after stopping it. There is potential for serious side effects to an unborn child. Talk to your health care professional for more information. Do not breast-feed an infant while taking this medicine or for 7 days after stopping it. This medicine has caused ovarian failure in some women. This medicine may make it more difficult to get pregnant. Talk to your health care professional if you are concerned about your fertility. This medicine has caused decreased sperm counts in some men. This may make it more difficult to father a child. Talk to your health care professional if you are concerned about your fertility. What side effects may I notice from receiving this medication? Side effects that you should report to your doctor or health care professional as soon as possible: allergic reactions like skin rash, itching or  hives, swelling of the face, lips, or tongue chest pain diarrhea flushing, runny nose, sweating during infusion low blood counts - this medicine may decrease the number of white blood cells, red blood cells and platelets. You may be at increased risk for infections and bleeding. nausea, vomiting pain, swelling, warmth in the leg signs of decreased platelets or bleeding - bruising, pinpoint red spots on the skin, black, tarry stools, blood in the urine signs of infection - fever or chills, cough, sore throat, pain or difficulty passing urine signs of decreased red blood cells - unusually weak or tired, fainting spells, lightheadedness Side effects that usually do not require medical attention (report to your doctor or health care professional if they continue or are bothersome): constipation hair loss headache loss of appetite mouth sores stomach pain This list may not describe all possible side effects. Call your doctor for medical advice about side effects. You  may report side effects to FDA at 1-800-FDA-1088. Where should I keep my medication? This drug is given in a hospital or clinic and will not be stored at home. NOTE: This sheet is a summary. It may not cover all possible information. If you have questions about this medicine, talk to your doctor, pharmacist, or health care provider.  2022 Elsevier/Gold Standard (2019-04-02 17:46:13)  Hypomagnesemia Hypomagnesemia is a condition in which the level of magnesium in the blood is low. Magnesium is a mineral that is found in many foods. It is used in many different processes in the body. Hypomagnesemia can affect every organ in the body. In severe cases, it can cause life-threatening problems. What are the causes? This condition may be caused by: Not getting enough magnesium in your diet. Malnutrition. Problems with absorbing magnesium from the intestines. Dehydration. Alcohol abuse. Vomiting. Severe or chronic diarrhea. Some  medicines, including medicines that make you urinate more (diuretics). Certain diseases, such as kidney disease, diabetes, celiac disease, and overactive thyroid. What are the signs or symptoms? Symptoms of this condition include: Loss of appetite. Nausea and vomiting. Involuntary shaking or trembling of a body part (tremor). Muscle weakness. Tingling in the arms and legs. Sudden tightening of muscles (muscle spasms). Confusion. Psychiatric issues, such as depression, irritability, or psychosis. A feeling of fluttering of the heart. Seizures. These symptoms are more severe if magnesium levels drop suddenly. How is this diagnosed? This condition may be diagnosed based on: Your symptoms and medical history. A physical exam. Blood and urine tests. How is this treated? Treatment depends on the cause and the severity of the condition. It may be treated with: A magnesium supplement. This can be taken in pill form. If the condition is severe, magnesium is usually given through an IV. Changes to your diet. You may be directed to eat foods that have a lot of magnesium, such as green leafy vegetables, peas, beans, and nuts. Stopping any intake of alcohol. Follow these instructions at home:   Make sure that your diet includes foods with magnesium. Foods that have a lot of magnesium in them include: Green leafy vegetables, such as spinach and broccoli. Beans and peas. Nuts and seeds, such as almonds and sunflower seeds. Whole grains, such as whole grain bread and fortified cereals. Take magnesium supplements if your health care provider tells you to do that. Take them as directed. Take over-the-counter and prescription medicines only as told by your health care provider. Have your magnesium levels monitored as told by your health care provider. When you are active, drink fluids that contain electrolytes. Avoid drinking alcohol. Keep all follow-up visits as told by your health care provider.  This is important. Contact a health care provider if: You get worse instead of better. Your symptoms return. Get help right away if you: Develop severe muscle weakness. Have trouble breathing. Feel that your heart is racing. Summary Hypomagnesemia is a condition in which the level of magnesium in the blood is low. Hypomagnesemia can affect every organ in the body. Treatment may include eating more foods that contain magnesium, taking magnesium supplements, and not drinking alcohol. Have your magnesium levels monitored as told by your health care provider. This information is not intended to replace advice given to you by your health care provider. Make sure you discuss any questions you have with your health care provider. Document Revised: 07/15/2020 Document Reviewed: 10/03/2019 Elsevier Patient Education  [place].

## 2021-02-05 NOTE — Progress Notes (Signed)
[place] OFFICE PROGRESS NOTE   Diagnosis: Colon cancer  INTERVAL HISTORY:   Jeff [name] returns for follow-up.  He completed cycle of irinotecan/Panitumumab 01/05/2021.  His wife contacted the office 01/25/2021 to report cold symptoms.  COVID test returned positive.  He describes symptoms as similar to a "head cold".  Symptoms have resolved.  Appetite is improving.  He denies nausea/vomiting.  No mouth sores.  No diarrhea.  Skin rash is stable.    Objective:  Vital signs in last 24 hours:  Blood pressure 134/80, pulse (!) 108, temperature 98.2 F (36.8 C), temperature source Oral, resp. rate 18, height _0  (1.778 m), weight 243 lb 3.2 oz (110.3 kg), SpO2 100 %.    HEENT: No thrush or ulcers. Resp: Lungs clear bilaterally. Cardio: Regular rate and rhythm. GI: Abdomen soft and nontender.  No hepatomegaly. Vascular: No leg edema. Skin: Mild acne type rash on the face and upper chest. Port-A-Cath without erythema  Lab Results:  Lab Results  Component Value Date   WBC 7.7 02/05/2021   HGB 12.4 (L) 02/05/2021   HCT 40.5 02/05/2021   MCV 83.0 02/05/2021   PLT 200 02/05/2021   NEUTROABS 4.3 02/05/2021    Imaging:  No results found.  Medications: I have reviewed the patient's current medications.  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, biopsy confirmed adenocarcinoma         CTs chest, abdomen, and pelvis on 12/11/2015-indeterminate tiny pulmonary nodules, multiple liver metastases, small nodes in the sigmoid mesocolon Laparoscopic sigmoid colectomy, liver biopsy, Port-A-Cath placement 01/14/2016 Pathology sigmoid colon resection- colonic adenocarcinoma, 5.3 cm extending into pericolonic connective tissue, positive lymph-vascular invasion, positive perineural invasion, negative margins, metastatic carcinoma in 9 of 28 lymph nodes Pathology liver biopsy-metastatic colorectal  adenocarcinoma MSI stable; mismatch repair protein normal APC alteration, K RAS wild-type, no BRAF mutation Cycle 1 FOLFOX 02/02/2016 Cycle 2 FOLFOX 02/15/2016 Cycle 3 FOLFOX 02/29/2016 Cycle 4 FOLFOX 03/14/2016 Cycle 5 FOLFOX 03/28/2016 Cycle 6 FOLFOX 04/11/2016 (oxaliplatin held secondary to thrombocytopenia) 04/21/2016 restaging CTs-difficulty evaluating liver lesions due to hepatic steatosis. Stable right adrenal nodule. No adenopathy or local recurrence near the rectosigmoid anastomotic site. Cycle 7 FOLFOX 04/25/2016 MRI liver 05/02/2016-partial improvement in hepatic metastases Cycle 8 FOLFOX 05/10/2016 Cycle 9 FOLFOX 05/23/2016 (oxaliplatin held due to thrombocytopenia) Cycle 10 FOLFOX 06/06/2016 (oxaliplatin dose reduced due to thrombocytopenia) Cycle 11 FOLFOX 06/27/2016 (oxaliplatin held due to neuropathy) Cycle 12 FOLFOX 07/11/2016 (oxaliplatin held) Initiation of maintenance Xeloda 7 days on/7 days off 07/27/2016 MRI liver 11/18/2016-decrease in hepatic metastatic disease. No new or progressive disease identified within the abdomen. Continuation of Xeloda 7 days on/7 days off MRI liver 04/27/2017-previous liver lesions not identified, no new lesions, no lymphadenopathy Xeloda continued 7 days on/7 days off MRI liver 12/04/2017 - no evidence of metastatic disease, hepatic steatosis Xeloda continued 7 days on/7 days off MRI liver 07/15/2018- no evidence of metastatic disease.  Stable severe hepatic steatosis. Xeloda continued 7 days on/7 days off MRI liver 03/16/2019-hepatic steatosis, no liver mass, focal area of intrahepatic biliary dilatation in segments 2 and 3 of the left lobe-increased Xeloda continued 7 days on/7 days off MRI abdomen 08/19/2019-no findings to suggest liver metastases.  Bilateral lung nodules measuring up to 2.8 cm, progressive and more conspicuous than on previous exam CT chest 08/29/2019-multiple pulmonary metastases, new from 04/21/2016 Cycle 1  FOLFIRI/bevacizumab 09/09/2019 Cycle 2 FOLFIRI/bevacizumab 09/26/2019  Cycle 3 FOLFIRI/bevacizumab 10/10/2019 Cycle 4 FOLFIRI/bevacizumab 10/24/2019 Cycle 5  FOLFIRI/bevacizumab 11/07/2019 CT chest 11/14/2019-decreased size of lung nodules, no new lesions, hepatic steatosis Cycle 6 FOLFIRI/bevacizumab 11/21/2019 Cycle 7 FOLFIRI/bevacizumab 12/05/2019 Cycle 8 FOLFIRI/bevacizumab 12/19/2019 Cycle 9 FOLFIRI/bevacizumab 01/02/2020 Cycle 10 FOLFIRI/bevacizumab 01/16/2020 CT chest 01/29/2020-stable bilateral pulmonary metastases.  No new or progressive metastatic disease in the chest. Cycle 11 FOLFIRI/bevacizumab 01/30/2020 Cycle 12 FOLFIRI/bevacizumab 02/19/2020 Cycle 13 FOLFIRI/bevacizumab 03/12/2020 Cycle 14 FOLFIRI/bevacizumab 04/02/2020 Cycle 15 FOLFIRI/bevacizumab 04/23/2020 Cycle 16 FOLFIRI/bevacizumab 05/21/2020 CT chest 06/09/2020-mild progression pulmonary metastasis.  Some lesions have increased in size while others are similar. Cycle 1 irinotecan/Panitumumab 06/18/2020 Cycle 2 irinotecan/Panitumumab 07/02/2020 Cycle 3 irinotecan/panitumumab 07/16/2020 Cycle 4 irinotecan/Panitumumab 07/30/2020 Cycle 5 irinotecan/Panitumumab 08/13/2020, Emend added Cycle 6 irinotecan/Panitumumab 08/27/2020 CT chest 09/08/2020-decreased size of pulmonary nodules, no progressive disease Cycle 7 irinotecan/panitumumab 09/02/2020 Cycle 8 irinotecan/panitumumab 09/24/2020 Cycle 9 irinotecan/Panitumumab 10/08/2020 10/22/2020 treatment held due to left foot fracture, need for surgery Cycle 10 irinotecan/panitumumab 11/24/2020 Cycle 11 irinotecan/Panitumumab 12/08/2020 Cycle 12 irinotecan/panitumumab 12/22/2020 Cycle 13 irinotecan/panitumumab 01/05/2021 01/20/2021 CT chest-mixed response with minimal increase in size of some lesions and minimal decrease in the size of other lesions.  Overall number of lesions is unchanged. Cycle 14 irinotecan/Panitumumab 02/05/2021   2.   Rectal bleeding and constipation secondary to #1   3.   History of  peripheral vascular disease, status post left lower extremity vascular bypass surgery in April 2017   4.   History of nephrolithiasis   5.   History of Graves' disease treated with radioactive iodine   6.   Anxiety/depression   7.   Hypertension   8.   Hospitalization 01/19/2016 with wound dehiscence status post secondary suture closure of abdominal wall   9.   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 Hyperglycemia 06/20/2016-diagnosed with diabetes, maintained on insulin   11.  Positive COVID test 12/13/2018; positive COVID test 01/25/2021   12. Hospitalized with seizure activity/DKA. Now on Keppra, insulin adjusted. 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  Left foot fracture-surgical repair 11/11/2020      Disposition: Mr. Masoud appears stable.  He is on active treatment with irinotecan/Panitumumab.  Recent restaging chest CT is overall stable.  CT report/images reviewed with Mr. Manna and his wife at today's visit by Dr. Benay Spice.  Dr. Benay Spice recommends continuation of current treatment, change interval from 2 weeks to 3 weeks.  Mr. Crass agrees with this plan, cycle 14 today.  We reviewed the CBC and chemistry panel from today.  Labs adequate to proceed with treatment.  He has mild hypokalemia and hypomagnesia likely secondary to Panitumumab.  He will receive IV magnesium today.  He will begin K-Dur 20 mEq daily.  He will return for lab, follow-up, irinotecan/Panitumumab 02/23/2021.  He will contact the office in the interim with any problems.  Patient seen with Dr. Benay Spice.    Ned Card ANP/GNP-BC   02/05/2021  11:35 AM This was a shared visit with Ned Card.  We reviewed the restaging CT images with  Mr. Heldt.  The overall pattern is consistent with stable disease.  This includes a significant amount of time off of treatment due to coexisting medical conditions.  The plan is to continue irinotecan/panitumumab.  He will change to an every 3-week schedule.  I was present for greater than 50% of today's visit.  I performed medical decision making.  Julieanne Manson, MD

## 2021-02-05 NOTE — Progress Notes (Signed)
Patient seen by Ned Card NP today  Vitals are within treatment parameters.  Labs reviewed by Ned Card NP and are within treatment parameters.  Per physician team, patient is ready for treatment and there are NO modifications to the treatment plan.   Added IV mag infusion aware

## 2021-02-05 NOTE — Progress Notes (Signed)
Patient presents for treatment. RN assessment completed along with the following:  Labs/vitals reviewed - Yes, and per Leander Rams, patient will receive mag    Weight within 10% of previous measurement - Yes Oncology Treatment Attestation completed for current therapy- Yes, on date 06/12/20 Informed consent completed and reflects current therapy/intent - Yes, on date 06/18/20             Provider progress note reviewed - Yes, today's provider note was reviewed. Treatment/Antibody/Supportive plan reviewed - Blank single:19197::"Yes, and magnesium added to treatment" S&H and other orders reviewed - Yes, and magnesium added Previous treatment date reviewed - Yes, and the appropriate amount of time has elapsed between treatments. Clinic Hand Off Received from - Leander Rams, NP  Patient to proceed with treatment.

## 2021-02-11 ENCOUNTER — Ambulatory Visit: Payer: Medicaid Other | Admitting: Neurology

## 2021-02-11 ENCOUNTER — Other Ambulatory Visit: Payer: Self-pay

## 2021-02-11 ENCOUNTER — Encounter: Payer: Self-pay | Admitting: Neurology

## 2021-02-11 VITALS — BP 135/85 | HR 100 | Ht 70.0 in | Wt 242.8 lb

## 2021-02-11 DIAGNOSIS — G40309 Generalized idiopathic epilepsy and epileptic syndromes, not intractable, without status epilepticus: Secondary | ICD-10-CM | POA: Diagnosis not present

## 2021-02-11 DIAGNOSIS — G4730 Sleep apnea, unspecified: Secondary | ICD-10-CM | POA: Diagnosis not present

## 2021-02-11 MED ORDER — LEVETIRACETAM 750 MG PO TABS: 750.0000 mg | ORAL_TABLET | Freq: Two times a day (BID) | ORAL | 3 refills | Status: DC

## 2021-02-11 NOTE — Patient Instructions (Signed)
Always good to see you. Continue Keppra 750mg  twice a day. Proceed with visit with sleep specialist. Follow-up in 8 months, call for any changes   Seizure Precautions: 1. If medication has been prescribed for you to prevent seizures, take it exactly as directed.  Do not stop taking the medicine without talking to your doctor first, even if you have not had a seizure in a long time.   2. Avoid activities in which a seizure would cause danger to yourself or to others.  Don't operate dangerous machinery, swim alone, or climb in high or dangerous places, such as on ladders, roofs, or girders.  Do not drive unless your doctor says you may.  3. If you have any warning that you may have a seizure, lay down in a safe place where you can't hurt yourself.    4.  No driving for 6 months from last seizure, as per [facility].   Please refer to the following link on the [place] website for more information: http://[email or web address].org/[email or web address]/driving/drivingu.cfm   5.  Maintain good sleep hygiene. Avoid alcohol.  6.  Contact your doctor if you have any problems that may be related to the medicine you are taking.  7.  Call 911 and bring the patient back to the ED if:        A.  The seizure lasts longer than 5 minutes.       B.  The patient doesn't awaken shortly after the seizure  C.  The patient has new problems such as difficulty seeing, speaking or moving  D.  The patient was injured during the seizure  E.  The patient has a temperature over 102 F (39C)  F.  The patient vomited and now is having trouble breathing

## 2021-02-11 NOTE — Progress Notes (Signed)
NEUROLOGY FOLLOW UP OFFICE NOTE  Jeff [name] [id number] [birth year]  HISTORY OF PRESENT ILLNESS: I had the pleasure of seeing Jeff [name] in follow-up in the neurology clinic on 02/11/2021.  The patient was last seen 7 months ago for seizures. He is again accompanied by his wife who helps supplement the history today.  Records and images were personally reviewed where available.  Since his last visit, he had a sleep study in 12/2020 showing mild OSA, mild oxygen desaturation, fragmented sleep partly attributable to respiratory events. He has been referred to a sleep specialist. They deny any seizures since 03/2020 on Levetiracetam 750mg  BID. They deny any staring/unresponsive episodes, gaps in time, olfactory/gustatory hallucinations, focal numbness/tingling/weakness, myoclonic jerks. No headaches, vision changes. He has some dizziness mostly when he has not eaten. He has not been sleeping well. His wife ensures he takes his medications, glucose levels have been pretty good. Mood is "always wonderful." He had a fall in June where he fractured his left foot and had 2 surgeries.    History on Initial Assessment 11/14/2019: This is a 55 year old right-handed man with a history of hypertension, diabetes, colon cancer on chemotherapy, Graves disease s/p radioactive iodine, PVD, s/p bypass graft, chronic thrombocytopenia, presenting for evaluation of new onset seizure last 10/25/2019. He received chemotherapy then later that evening he lay down at bed then came to the kitchen twice, acting unusually. His wife reports he opened his chemo pouch and was messing with his port. She kept reminding him to stop touching it, he said "yeah," and she asked if it was hurting. He went back to the bedroom and she said good night, then heard a noise and found him unresponsive. He started yelling, both hands were flexed to his face that he scratched his face. He was turned to his right side and confused after. The seizure  lasted a few minutes. He had 2 more seizures witnessed by EMS and was given Versed. He was initially brought to [facility] where glucose level was 566, he was hypotensive with SBP in the 80s and transferred to [facility]. He had an MRI brain which did not show any acute changes. His EEG showed intermittent generalized 4-5 Hz theta slowing with frequent spikes over the right frontocentral region, maximal at F4. He was discharged home on Levetiracetam 500mg  BID, he denies any side effects. He and his wife deny any prior history of seizures but recall 2 episodes of loss of consciousness, he was in thyroid storm one time. Another time 11 years ago he lost his memory, he was going to [facility] but ended up going to [place], he passed out and then apparently walked out of the hospital, they found him on a train track. His ammonia level was high that time. She denies any staring/unresponsive episodes. He denies any gaps in time. Sometimes he smells Clorox or hand sanitizer. He has been very sensitive to smells, smelling Clorox since diagnosed with cancer. He has hypnic jerks in sleep that wake him up, none during the day.   Since the seizure, he has had bilateral shoulder/arm pain, with sharp pains when he tries to lift them up. His glucose levels are better with insulin dose changes. He has paresthesias in both feet. He has occasional headaches but has been complaining of them more recently, with pain over the temples. He has nausea/vomiting with chemotherapy. He has occasional sensation of food getting stuck in his throat. He has chronic back pain. No bowel/bladder dysfunction. Memory  is okay. He denies any alcohol use. He has sleep difficulties, melatonin does not help. He had a normal birth and early development.  There is no history of febrile convulsions, CNS infections such as meningitis/encephalitis, significant traumatic brain injury, neurosurgical procedures, or family history of seizures.  Diagnostic  Data: MRI brain in 10/2019 did not show any acute changes.  EEG in 10/2019 showed intermittent generalized 4-5 Hz theta slowing with frequent spikes over the right frontocentral region, maximal at F4 24-hour EEG in 04/2020 was abnormal with occasional generalized 4-5 Hz spike and wave discharges consistent with a primary generalized epilepsy.   PAST MEDICAL HISTORY: Past Medical History:  Diagnosis Date   Allergic rhinitis    Anxiety    Arthritis    Chemotherapy-induced thrombocytopenia    Chronic nausea    mild intermittant and loose stools due to colon cancer   Chronic neck and back pain    Depression    ED (erectile dysfunction)    Generalized idiopathic epilepsy and epileptic syndromes, not intractable, without status epilepticus ([place]) 10/25/2019   (11-10-2020 per pt wife last seizure 11/ 2021)  neurologist-- dr Delice Lesch---  seizure activity/ DKA ;  EEG w/ evidence epileto genicity arising right frontocentral region;  taking keppra   GERD (gastroesophageal reflux disease)    Hiatal hernia    History of 2019 novel coronavirus disease (COVID-19) 12/13/2018   per pt wife , pt had moderate symtptoms that resolved   History of Graves' disease    s/p RAI 02/ 2012;  previous seen by endocrinologist--- dr Loanne Drilling   History of kidney stones    History of non-ST elevation myocardial infarction (NSTEMI)    Jan 2014--  no CAD;  per notes probable coronary vasospasm   History of panic attacks    History of thyroid storm    Nov 2011 w/ grave's ,  s/p RAI 02/ 2012   Hyperlipidemia    Hypertension    Hypothyroidism following radioiodine therapy    RAI in 02/  2012---  previously followed by dr Loanne Drilling,  now followed by pcp   Lower urinary tract symptoms (LUTS)    Metastatic colon cancer to liver ([place]) 11/2015   oncologist-- [facility]/ and seen by dr a. [name];  01-14-2016 s/p sigmoid colectomy, liver bx, node dissection, PAC;   chemo started 09/ 2017  ;  04/ 2021 metastatic pulmonary nodules;    active treatment every 2 wks   Nephrolithiasis    left side non-obstructive per imaging 04/ 2022   PAD (peripheral artery disease) [facility]) cardiologist-  dr Fletcher Anon   cardiologist ----  dr Jerilynn Mages. Fletcher Anon ;   left popliteal occlusion behind knee w/ collaterals;  s/p attempted angioplasty 01/ 2016;  09-11-2015 s/p pop-TPT w/ ipsNR GSV   PONV (postoperative nausea and vomiting)    Rash    due to chemo   Type 2 diabetes mellitus treated with insulin ([place])    followed by pcp----   (11-10-2020  per pt wife blood sugar check's 2-4 times daily,  fasting sugar--- 130--168)   Unspecified fracture of left foot, initial encounter for closed fracture 10/17/2020   midfoot    MEDICATIONS: Current Outpatient Medications on File Prior to Visit  Medication Sig Dispense Refill   ACCU-CHEK GUIDE test strip CHECK BLOOD SUGAR THREE TIMES DAILY AS DIRECTED     ACCU-CHEK SOFTCLIX LANCETS lancets USE  TID TO CHECK BLOOD SUGAR LEVELS  0   amLODipine (NORVASC) 10 MG tablet Take 10 mg by mouth daily.  aspirin EC 81 MG tablet Take 81 mg by mouth daily.     atorvastatin (LIPITOR) 80 MG tablet Take 1 tablet (80 mg total) by mouth at bedtime. 90 tablet 1   chlorhexidine (PERIDEX) 0.12 % solution chlorhexidine gluconate 0.12 % mouthwash  RINSE MOUTH WITH 15ML (1 CAPFUL) FOR 30 SECONDS IN THE MORNING AND NIGHT AFTER TOOTHBRUSHING. EXPECTORATE AFTER RINSING, DO NOT SWALLOW     clonazePAM (KLONOPIN) 1 MG tablet Take 1 mg by mouth 2 (two) times daily as needed.     dapagliflozin propanediol (FARXIGA) 10 MG TABS tablet Take 10 mg by mouth daily.     docusate sodium (COLACE) 100 MG capsule Take 100 mg by mouth 2 (two) times daily.     doxycycline (VIBRA-TABS) 100 MG tablet TAKE 1 TABLET(100 MG) BY MOUTH TWICE DAILY 60 tablet 3   fluticasone (CUTIVATE) 0.05 % cream APPLY TOPICALLY TO THE AFFECTED AREA TWICE DAILY 30 g 2   GLOBAL EASE INJECT PEN NEEDLES 31G X 5 MM MISC Inject 1 Syringe into the skin 4 (four) times daily.      hydrocortisone 2.5 % cream APPLY TOPICALLY TO FACE RASH DAILY 28.35 g 1   insulin aspart (NOVOLOG) 100 UNIT/ML injection Inject 12 Units into the skin 3 (three) times daily before meals. 12 mL 0   Insulin Degludec (TRESIBA FLEXTOUCH [place]) Inject 90 Units into the skin daily.      levETIRAcetam (KEPPRA) 750 MG tablet Take 1 tablet (750 mg total) by mouth 2 (two) times daily. 180 tablet 3   levothyroxine (SYNTHROID) 125 MCG tablet Take 125 mcg by mouth daily.     lidocaine-prilocaine (EMLA) cream Apply 1 application topically as needed. Apply to portacath site 1 hour prior to use 30 g 3   LORazepam (ATIVAN) 0.5 MG tablet TAKE 1 TABLET(0.5 MG) BY MOUTH EVERY 8 HOURS AS NEEDED FOR ANXIETY OR SLEEP OR NAUSEA (Patient taking differently: Take 0.5 mg by mouth every 8 (eight) hours as needed.) 30 tablet 0   magnesium oxide (MAG-OX) 400 MG tablet magnesium oxide 400 mg (241.3 mg magnesium) tablet     MAGNESIUM-OXIDE 400 (240 Mg) MG tablet TAKE 1 TABLET(400 MG) BY MOUTH TWICE DAILY 60 tablet 1   meclizine (ANTIVERT) 25 MG tablet Take 25 mg by mouth 3 (three) times daily as needed for dizziness.  1   metFORMIN (GLUCOPHAGE-XR) 500 MG 24 hr tablet Take 1,000 mg by mouth in the morning and at bedtime.  3   metoprolol tartrate (LOPRESSOR) 50 MG tablet Take 1 tablet (50 mg total) by mouth 2 (two) times daily. 180 tablet 3   nitroGLYCERIN (NITROSTAT) 0.4 MG SL tablet PLACE 1 TABLET UNDER THE TONGUE EVERY 5 MINUTES AS NEEDED FOR CHEST PAIN. (Patient taking differently: Place 0.4 mg under the tongue every 5 (five) minutes as needed. PLACE 1 TABLET UNDER THE TONGUE EVERY 5 MINUTES AS NEEDED FOR CHEST PAIN.) 25 tablet 2   OLANZapine (ZYPREXA) 10 MG tablet Take 0.5 tablets (5 mg total) by mouth as directed. Take daily x 3 days starting day of pump discontinue for each chemo (2 cycles per month) 3 tablet 3   ondansetron (ZOFRAN) 4 MG tablet Take 1 tablet (4 mg total) by mouth every 8 (eight) hours as needed for nausea or  vomiting. 20 tablet 0   ondansetron (ZOFRAN) 8 MG tablet Take 1 tablet (8 mg total) by mouth every 8 (eight) hours as needed for nausea or vomiting. Begin 72 hours after chemo treatment 30 tablet  1   oxyCODONE (ROXICODONE) 5 MG immediate release tablet Take 1 tablet (5 mg total) by mouth every 4 (four) hours as needed for severe pain. To supplement for post-op pain 30 tablet 0   oxyCODONE-acetaminophen (PERCOCET) 10-325 MG tablet Take 1 tablet by mouth every 6 (six) hours as needed.     oxyCODONE-acetaminophen (PERCOCET/ROXICET) 5-325 MG tablet Take 1 tablet by mouth every 4 (four) hours as needed for severe pain. 30 tablet 0   pantoprazole (PROTONIX) 40 MG tablet Take 40 mg by mouth 2 (two) times daily.  5   penicillin v potassium (VEETID) 500 MG tablet penicillin V potassium 500 mg tablet  TAKE 1 TABLET 4 TIMES DAILY UNTIL GONE.     potassium chloride SA (KLOR-CON) 20 MEQ tablet Take 1 tablet (20 mEq total) by mouth daily. 30 tablet 1   promethazine (PHENERGAN) 12.5 MG tablet Take 1 tablet (12.5 mg total) by mouth every 6 (six) hours as needed. 30 tablet 2   tamsulosin (FLOMAX) 0.4 MG CAPS capsule Take 0.4 mg by mouth 2 (two) times daily.     venlafaxine XR (EFFEXOR-XR) 75 MG 24 hr capsule Take 225 mg by mouth daily.     Current Facility-Administered Medications on File Prior to Visit  Medication Dose Route Frequency Provider Last Rate Last Admin   alteplase (CATHFLO ACTIVASE) injection 2 mg  2 mg Intracatheter Once PRN Ladell Pier, MD       sodium chloride flush (NS) 0.9 % injection 10 mL  10 mL Intravenous PRN Ladell Pier, MD   10 mL at 02/04/16 1431   sodium chloride flush (NS) 0.9 % injection 10 mL  10 mL Intravenous PRN Ladell Pier, MD   10 mL at 10/18/16 3151   sodium chloride flush (NS) 0.9 % injection 10 mL  10 mL Intravenous PRN Ladell Pier, MD   10 mL at 05/03/17 0855    ALLERGIES: Allergies  Allergen Reactions   Hydrocodone Hives    FAMILY HISTORY: Family  History  Problem Relation Age of Onset   Thyroid disease Mother        hypothyroidism   Heart attack Maternal Grandfather    Heart Problems Father        pacermaker   Edema Father    Heart disease Maternal Grandmother    Lung cancer Maternal Grandmother    Diabetes Maternal Grandmother    Hypertension Maternal Grandmother     SOCIAL HISTORY: Social History   Socioeconomic History   Marital status: Married    Spouse name: Not on file   Number of children: 5   Years of education: Not on file   Highest education level: Not on file  Occupational History   Occupation: Disabled    Employer: UNEMPLOYED  Tobacco Use   Smoking status: Never   Smokeless tobacco: Never  Vaping Use   Vaping Use: Never used  Substance and Sexual Activity   Alcohol use: Yes    Comment: RARE   Drug use: Never   Sexual activity: Not on file  Other Topics Concern   Not on file  Social History Narrative   Married - wife works Surveyor, quantity unit @ [employer]   5 daughters   Regular exercise-no   Disabled to due back problem   12/04/2015   Right handed    Social Determinants of Health   Financial Resource Strain: Not on file  Food Insecurity: Not on file  Transportation Needs: Not on file  Physical Activity:  Not on file  Stress: Not on file  Social Connections: Not on file  Intimate Partner Violence: Not on file     PHYSICAL EXAM: Vitals:   02/11/21 1044  BP: 135/85  Pulse: 100  SpO2: 99%   General: No acute distress Head:  Normocephalic/atraumatic Skin/Extremities: No rash, no edema Neurological Exam: alert and awake. No aphasia or dysarthria. Fund of knowledge is appropriate.  Recent and remote memory are intact.  Attention and concentration are normal.   Cranial nerves: Pupils equal, round. Extraocular movements intact with no nystagmus. Visual fields full.  No facial asymmetry.  Motor: Bulk and tone normal, muscle strength 5/5 throughout with no pronator drift.   Finger to nose testing  intact.  Gait narrow-based and steady, no ataxia   IMPRESSION: This is a pleasant 55 yo RH man with a history of hypertension, diabetes, colon cancer on chemotherapy, Graves disease s/p radioactive iodine, PVD, s/p bypass graft, chronic thrombocytopenia, with new onset seizure in June 2021. Glucose levels were significantly hight at that time, his EEG was abnormal with frequent spikes over the right frontocentral region, maximal at F4. MRI brain unremarkable. He had another seizure in November 2021. His 24-hour EEG showed generalized 4-5 Hz spike and wave discharges consistent with a primary generalized epilepsy. No further seizures since 03/2020 on Levetiracetam 750mg  BID, refills sent. Sleep study showed mild OSA, he has been referred to Sleep Medicine. He is aware of [place] driving laws to stop driving after a seizure until 6 months seizure-free. Follow-up in 8 months, call for any changes.    Thank you for allowing me to participate in his care.  Please do not hesitate to call for any questions or concerns.    Ellouise Newer, M.D.   CC: Lucita Lora, NP

## 2021-02-15 ENCOUNTER — Ambulatory Visit (INDEPENDENT_AMBULATORY_CARE_PROVIDER_SITE_OTHER): Payer: Medicaid Other | Admitting: Podiatry

## 2021-02-15 DIAGNOSIS — Z91199 Patient's noncompliance with other medical treatment and regimen due to unspecified reason: Secondary | ICD-10-CM

## 2021-02-18 ENCOUNTER — Other Ambulatory Visit: Payer: Self-pay

## 2021-02-18 ENCOUNTER — Encounter: Payer: Self-pay | Admitting: Podiatry

## 2021-02-18 ENCOUNTER — Ambulatory Visit (INDEPENDENT_AMBULATORY_CARE_PROVIDER_SITE_OTHER): Payer: Medicaid Other

## 2021-02-18 ENCOUNTER — Ambulatory Visit (INDEPENDENT_AMBULATORY_CARE_PROVIDER_SITE_OTHER): Payer: Medicaid Other | Admitting: Podiatry

## 2021-02-18 DIAGNOSIS — S93325D Dislocation of tarsometatarsal joint of left foot, subsequent encounter: Secondary | ICD-10-CM

## 2021-02-21 ENCOUNTER — Other Ambulatory Visit: Payer: Self-pay | Admitting: Oncology

## 2021-02-23 ENCOUNTER — Inpatient Hospital Stay: Payer: Medicaid Other

## 2021-02-23 ENCOUNTER — Inpatient Hospital Stay (HOSPITAL_BASED_OUTPATIENT_CLINIC_OR_DEPARTMENT_OTHER): Payer: Medicaid Other | Admitting: Nurse Practitioner

## 2021-02-23 ENCOUNTER — Other Ambulatory Visit: Payer: Self-pay

## 2021-02-23 ENCOUNTER — Other Ambulatory Visit: Payer: Self-pay | Admitting: Oncology

## 2021-02-23 ENCOUNTER — Other Ambulatory Visit (HOSPITAL_COMMUNITY): Payer: Self-pay | Admitting: Cardiovascular Disease

## 2021-02-23 ENCOUNTER — Inpatient Hospital Stay: Payer: Medicaid Other | Attending: Oncology

## 2021-02-23 ENCOUNTER — Encounter: Payer: Self-pay | Admitting: Nurse Practitioner

## 2021-02-23 VITALS — BP 145/80 | HR 109 | Temp 98.1°F | Resp 18 | Ht 70.0 in | Wt 241.0 lb

## 2021-02-23 VITALS — HR 86

## 2021-02-23 DIAGNOSIS — I739 Peripheral vascular disease, unspecified: Secondary | ICD-10-CM

## 2021-02-23 DIAGNOSIS — Z5112 Encounter for antineoplastic immunotherapy: Secondary | ICD-10-CM | POA: Insufficient documentation

## 2021-02-23 DIAGNOSIS — C787 Secondary malignant neoplasm of liver and intrahepatic bile duct: Secondary | ICD-10-CM

## 2021-02-23 DIAGNOSIS — Z5111 Encounter for antineoplastic chemotherapy: Secondary | ICD-10-CM | POA: Diagnosis present

## 2021-02-23 DIAGNOSIS — C7802 Secondary malignant neoplasm of left lung: Secondary | ICD-10-CM | POA: Diagnosis not present

## 2021-02-23 DIAGNOSIS — C7801 Secondary malignant neoplasm of right lung: Secondary | ICD-10-CM | POA: Diagnosis not present

## 2021-02-23 DIAGNOSIS — C189 Malignant neoplasm of colon, unspecified: Secondary | ICD-10-CM

## 2021-02-23 DIAGNOSIS — C187 Malignant neoplasm of sigmoid colon: Secondary | ICD-10-CM | POA: Insufficient documentation

## 2021-02-23 LAB — CBC WITH DIFFERENTIAL (CANCER CENTER ONLY)
Abs Immature Granulocytes: 0.03 10*3/uL (ref 0.00–0.07)
Basophils Absolute: 0 10*3/uL (ref 0.0–0.1)
Basophils Relative: 0 %
Eosinophils Absolute: 0.2 10*3/uL (ref 0.0–0.5)
Eosinophils Relative: 3 %
HCT: 44 % (ref 39.0–52.0)
Hemoglobin: 13.4 g/dL (ref 13.0–17.0)
Immature Granulocytes: 0 %
Lymphocytes Relative: 38 %
Lymphs Abs: 2.8 10*3/uL (ref 0.7–4.0)
MCH: 25.2 pg — ABNORMAL LOW (ref 26.0–34.0)
MCHC: 30.5 g/dL (ref 30.0–36.0)
MCV: 82.7 fL (ref 80.0–100.0)
Monocytes Absolute: 0.6 10*3/uL (ref 0.1–1.0)
Monocytes Relative: 8 %
Neutro Abs: 3.8 10*3/uL (ref 1.7–7.7)
Neutrophils Relative %: 51 %
Platelet Count: 227 10*3/uL (ref 150–400)
RBC: 5.32 MIL/uL (ref 4.22–5.81)
RDW: 16.3 % — ABNORMAL HIGH (ref 11.5–15.5)
WBC Count: 7.5 10*3/uL (ref 4.0–10.5)
nRBC: 0 % (ref 0.0–0.2)

## 2021-02-23 LAB — CMP (CANCER CENTER ONLY)
ALT: 42 U/L (ref 0–44)
AST: 38 U/L (ref 15–41)
Albumin: 4 g/dL (ref 3.5–5.0)
Alkaline Phosphatase: 114 U/L (ref 38–126)
Anion gap: 12 (ref 5–15)
BUN: 6 mg/dL (ref 6–20)
CO2: 24 mmol/L (ref 22–32)
Calcium: 9.2 mg/dL (ref 8.9–10.3)
Chloride: 101 mmol/L (ref 98–111)
Creatinine: 1.06 mg/dL (ref 0.61–1.24)
GFR, Estimated: >60 mL/min (ref >60)
Glucose, Bld: 466 mg/dL — ABNORMAL HIGH (ref 70–99)
Potassium: 3.7 mmol/L (ref 3.5–5.1)
Sodium: 137 mmol/L (ref 135–145)
Total Bilirubin: 0.4 mg/dL (ref 0.3–1.2)
Total Protein: 7.4 g/dL (ref 6.5–8.1)

## 2021-02-23 LAB — CEA (ACCESS): CEA (CHCC): 6.51 ng/mL — ABNORMAL HIGH (ref 0.00–5.00)

## 2021-02-23 LAB — MAGNESIUM: Magnesium: 1.7 mg/dL (ref 1.7–2.4)

## 2021-02-23 MED ORDER — SODIUM CHLORIDE 0.9% FLUSH
10.0000 mL | INTRAVENOUS | Status: DC | PRN
  Administered 2021-02-23: 10 mL

## 2021-02-23 MED ORDER — ATROPINE SULFATE 1 MG/ML IJ SOLN
0.4000 mg | Freq: Once | INTRAMUSCULAR | Status: AC
  Administered 2021-02-23: 0.4 mg via INTRAVENOUS
  Filled 2021-02-23: qty 1

## 2021-02-23 MED ORDER — SODIUM CHLORIDE 0.9 % IV SOLN
6.0000 mg/kg | Freq: Once | INTRAVENOUS | Status: AC
  Administered 2021-02-23: 700 mg via INTRAVENOUS
  Filled 2021-02-23: qty 15

## 2021-02-23 MED ORDER — SODIUM CHLORIDE 0.9 % IV SOLN
150.0000 mg | Freq: Once | INTRAVENOUS | Status: AC
  Administered 2021-02-23: 150 mg via INTRAVENOUS
  Filled 2021-02-23: qty 150

## 2021-02-23 MED ORDER — SODIUM CHLORIDE 0.9 % IV SOLN
180.0000 mg/m2 | Freq: Once | INTRAVENOUS | Status: AC
  Administered 2021-02-23: 420 mg via INTRAVENOUS
  Filled 2021-02-23: qty 15

## 2021-02-23 MED ORDER — PALONOSETRON HCL INJECTION 0.25 MG/5ML
0.2500 mg | Freq: Once | INTRAVENOUS | Status: AC
  Administered 2021-02-23: 0.25 mg via INTRAVENOUS
  Filled 2021-02-23: qty 5

## 2021-02-23 MED ORDER — DEXAMETHASONE SODIUM PHOSPHATE 10 MG/ML IJ SOLN
5.0000 mg | Freq: Once | INTRAMUSCULAR | Status: AC
  Administered 2021-02-23: 5 mg via INTRAVENOUS
  Filled 2021-02-23: qty 1

## 2021-02-23 MED ORDER — SODIUM CHLORIDE 0.9 % IV SOLN: Freq: Once | INTRAVENOUS | Status: AC

## 2021-02-23 MED ORDER — HEPARIN SOD (PORK) LOCK FLUSH 100 UNIT/ML IV SOLN
500.0000 [IU] | Freq: Once | INTRAVENOUS | Status: AC | PRN
  Administered 2021-02-23: 500 [IU]

## 2021-02-23 MED ORDER — LORAZEPAM 0.5 MG PO TABS: 0.5000 mg | ORAL_TABLET | Freq: Three times a day (TID) | ORAL | 0 refills | Status: DC | PRN

## 2021-02-23 NOTE — Progress Notes (Signed)
Patient presents for treatment. RN assessment completed along with the following:  Labs/vitals reviewed - Yes, and within treatment parameters.   Weight within 10% of previous measurement - Yes Oncology Treatment Attestation completed for current therapy- Yes, on date 06/12/2020 Informed consent completed and reflects current therapy/intent - Yes, on date 06/18/2020             Provider progress note reviewed - Yes, today's provider note was reviewed. Treatment/Antibody/Supportive plan reviewed - Yes, and there are no adjustments needed for today's treatment. S&H and other orders reviewed - Yes, and there are no additional orders identified. Previous treatment date reviewed - Yes, and the appropriate amount of time has elapsed between treatments. Clinic Hand Off Received from - Yes, Lattie Haw, NP  Patient to proceed with treatment.

## 2021-02-23 NOTE — Patient Instructions (Signed)
[facility]   Discharge Instructions: Thank you for choosing [facility] to provide your oncology and hematology care.   If you have a lab appointment with the [facility], please go directly to the [facility] and check in at the registration area.   Wear comfortable clothing and clothing appropriate for easy access to any Portacath or PICC line.   We strive to give you quality time with your provider. You may need to reschedule your appointment if you arrive late (15 or more minutes).  Arriving late affects you and other patients whose appointments are after yours.  Also, if you miss three or more appointments without notifying the office, you may be dismissed from the clinic at the provider's discretion.      For prescription refill requests, have your pharmacy contact our office and allow 72 hours for refills to be completed.    Today you received the following chemotherapy and/or immunotherapy agents Panitumumab (VECTIBIX) & Irinotecan (CAMPTOSAR).      To help prevent nausea and vomiting after your treatment, we encourage you to take your nausea medication as directed.  BELOW ARE SYMPTOMS THAT SHOULD BE REPORTED IMMEDIATELY: *FEVER GREATER THAN 100.4 F (38 C) OR HIGHER *CHILLS OR SWEATING *NAUSEA AND VOMITING THAT IS NOT CONTROLLED WITH YOUR NAUSEA MEDICATION *UNUSUAL SHORTNESS OF BREATH *UNUSUAL BRUISING OR BLEEDING *URINARY PROBLEMS (pain or burning when urinating, or frequent urination) *BOWEL PROBLEMS (unusual diarrhea, constipation, pain near the anus) TENDERNESS IN MOUTH AND THROAT WITH OR WITHOUT PRESENCE OF ULCERS (sore throat, sores in mouth, or a toothache) UNUSUAL RASH, SWELLING OR PAIN  UNUSUAL VAGINAL DISCHARGE OR ITCHING   Items with * indicate a potential emergency and should be followed up as soon as possible or go to the Emergency Department if any problems should occur.  Please show the CHEMOTHERAPY ALERT CARD or  IMMUNOTHERAPY ALERT CARD at check-in to the Emergency Department and triage nurse.  Should you have questions after your visit or need to cancel or reschedule your appointment, please contact [facility]  Dept: [phone]  and follow the prompts.  Office hours are 8:00 a.m. to 4:30 p.m. Monday - Friday. Please note that voicemails left after 4:00 p.m. may not be returned until the following business day.  We are closed weekends and major holidays. You have access to a nurse at all times for urgent questions. Please call the main number to the clinic Dept: [phone] and follow the prompts.   For any non-urgent questions, you may also contact your provider using MyChart. We now offer e-Visits for anyone 55 and older to request care online for non-urgent symptoms. For details visit [email or web address].   Also download the MyChart app! Go to the app store, search "MyChart", open the app, select [facility], and log in with your MyChart username and password.  Due to Covid, a mask is required upon entering the hospital/clinic. If you do not have a mask, one will be given to you upon arrival. For doctor visits, patients may have 1 support person aged 61 or older with them. For treatment visits, patients cannot have anyone with them due to current Covid guidelines and our immunocompromised population.   Panitumumab Solution for Injection What is this medication? PANITUMUMAB (pan i TOOM ue mab) is a monoclonal antibody. It is used to treat colorectal cancer. This medicine may be used for other purposes; ask your health care provider or pharmacist if you have questions.  COMMON BRAND NAME(S): Vectibix What should I tell my care team before I take this medication? They need to know if you have any of these conditions: eye disease, vision problems low levels of calcium, magnesium, or potassium in the blood lung or breathing disease, like asthma skin conditions or  sensitivity an unusual or allergic reaction to panitumumab, other medicines, foods, dyes, or preservatives pregnant or trying to get pregnant breast-feeding How should I use this medication? This drug is given as an infusion into a vein. It is administered in a hospital or clinic by a specially trained health care professional. Talk to your pediatrician regarding the use of this medicine in children. Special care may be needed. Overdosage: If you think you have taken too much of this medicine contact a poison control center or emergency room at once. NOTE: This medicine is only for you. Do not share this medicine with others. What if I miss a dose? It is important not to miss your dose. Call your doctor or health care professional if you are unable to keep an appointment. What may interact with this medication? Do not take this medicine with any of the following medications: bevacizumab This list may not describe all possible interactions. Give your health care provider a list of all the medicines, herbs, non-prescription drugs, or dietary supplements you use. Also tell them if you smoke, drink alcohol, or use illegal drugs. Some items may interact with your medicine. What should I watch for while using this medication? Visit your doctor for checks on your progress. This drug may make you feel generally unwell. This is not uncommon, as chemotherapy can affect healthy cells as well as cancer cells. Report any side effects. Continue your course of treatment even though you feel ill unless your doctor tells you to stop. This medicine can make you more sensitive to the sun. Keep out of the sun while receiving this medicine and for 2 months after the last dose. If you cannot avoid being in the sun, wear protective clothing and use sunscreen. Do not use sun lamps or tanning beds/booths. In some cases, you may be given additional medicines to help with side effects. Follow all directions for their  use. Call your doctor or health care professional for advice if you get a fever, chills or sore throat, or other symptoms of a cold or flu. Do not treat yourself. This drug decreases your body's ability to fight infections. Try to avoid being around people who are sick. Avoid taking products that contain aspirin, acetaminophen, ibuprofen, naproxen, or ketoprofen unless instructed by your doctor. These medicines may hide a fever. Do not become pregnant while taking this medicine and for 2 months after the last dose. Women should inform their doctor if they wish to become pregnant or think they might be pregnant. There is a potential for serious side effects to an unborn child. Talk to your health care professional or pharmacist for more information. Do not breast-feed an infant while taking this medicine or for 2 months after the last dose. What side effects may I notice from receiving this medication? Side effects that you should report to your doctor or health care professional as soon as possible: allergic reactions like skin rash, itching or hives, swelling of the face, lips, or tongue breathing problems changes in vision eye pain fast, irregular heartbeat fever, chills mouth sores red spots on the skin redness, blistering, peeling or loosening of the skin, including inside the mouth signs and symptoms of  kidney injury like trouble passing urine or change in the amount of urine signs and symptoms of low blood pressure like dizziness; feeling faint or lightheaded, falls; unusually weak or tired signs of low calcium like fast heartbeat, muscle cramps or muscle pain; pain, tingling, numbness in the hands or feet; seizures signs and symptoms of low magnesium like muscle cramps, pain, or weakness; tremors; seizures; or fast, irregular heartbeat signs and symptoms of low potassium like muscle cramps or muscle pain; chest pain; dizziness; feeling faint or lightheaded, falls; palpitations; breathing  problems; or fast, irregular heartbeat swelling of the ankles, feet, hands Side effects that usually do not require medical attention (report to your doctor or health care professional if they continue or are bothersome): changes in skin like acne, cracks, skin dryness diarrhea eyelash growth headache mouth sores nail changes nausea, vomiting This list may not describe all possible side effects. Call your doctor for medical advice about side effects. You may report side effects to FDA at 1-800-FDA-1088. Where should I keep my medication? This drug is given in a hospital or clinic and will not be stored at home. NOTE: This sheet is a summary. It may not cover all possible information. If you have questions about this medicine, talk to your doctor, pharmacist, or health care provider.  2022 Elsevier/Gold Standard (2015-11-20 16:45:04)  Irinotecan injection What is this medication? IRINOTECAN (ir in oh TEE kan ) is a chemotherapy drug. It is used to treat colon and rectal cancer. This medicine may be used for other purposes; ask your health care provider or pharmacist if you have questions. COMMON BRAND NAME(S): Camptosar What should I tell my care team before I take this medication? They need to know if you have any of these conditions: dehydration diarrhea infection (especially a virus infection such as chickenpox, cold sores, or herpes) liver disease low blood counts, like low white cell, platelet, or red cell counts low levels of calcium, magnesium, or potassium in the blood recent or ongoing radiation therapy an unusual or allergic reaction to irinotecan, other medicines, foods, dyes, or preservatives pregnant or trying to get pregnant breast-feeding How should I use this medication? This drug is given as an infusion into a vein. It is administered in a hospital or clinic by a specially trained health care professional. Talk to your pediatrician regarding the use of this  medicine in children. Special care may be needed. Overdosage: If you think you have taken too much of this medicine contact a poison control center or emergency room at once. NOTE: This medicine is only for you. Do not share this medicine with others. What if I miss a dose? It is important not to miss your dose. Call your doctor or health care professional if you are unable to keep an appointment. What may interact with this medication? Do not take this medicine with any of the following medications: cobicistat itraconazole This medicine may interact with the following medications: antiviral medicines for HIV or AIDS certain antibiotics like rifampin or rifabutin certain medicines for fungal infections like ketoconazole, posaconazole, and voriconazole certain medicines for seizures like carbamazepine, phenobarbital, phenotoin clarithromycin gemfibrozil nefazodone [facility] John's Wort This list may not describe all possible interactions. Give your health care provider a list of all the medicines, herbs, non-prescription drugs, or dietary supplements you use. Also tell them if you smoke, drink alcohol, or use illegal drugs. Some items may interact with your medicine. What should I watch for while using this medication? Your condition will  be monitored carefully while you are receiving this medicine. You will need important blood work done while you are taking this medicine. This drug may make you feel generally unwell. This is not uncommon, as chemotherapy can affect healthy cells as well as cancer cells. Report any side effects. Continue your course of treatment even though you feel ill unless your doctor tells you to stop. In some cases, you may be given additional medicines to help with side effects. Follow all directions for their use. You may get drowsy or dizzy. Do not drive, use machinery, or do anything that needs mental alertness until you know how this medicine affects you. Do not stand or  sit up quickly, especially if you are an older patient. This reduces the risk of dizzy or fainting spells. Call your health care professional for advice if you get a fever, chills, or sore throat, or other symptoms of a cold or flu. Do not treat yourself. This medicine decreases your body's ability to fight infections. Try to avoid being around people who are sick. Avoid taking products that contain aspirin, acetaminophen, ibuprofen, naproxen, or ketoprofen unless instructed by your doctor. These medicines may hide a fever. This medicine may increase your risk to bruise or bleed. Call your doctor or health care professional if you notice any unusual bleeding. Be careful brushing and flossing your teeth or using a toothpick because you may get an infection or bleed more easily. If you have any dental work done, tell your dentist you are receiving this medicine. Do not become pregnant while taking this medicine or for 6 months after stopping it. Women should inform their health care professional if they wish to become pregnant or think they might be pregnant. Men should not father a child while taking this medicine and for 3 months after stopping it. There is potential for serious side effects to an unborn child. Talk to your health care professional for more information. Do not breast-feed an infant while taking this medicine or for 7 days after stopping it. This medicine has caused ovarian failure in some women. This medicine may make it more difficult to get pregnant. Talk to your health care professional if you are concerned about your fertility. This medicine has caused decreased sperm counts in some men. This may make it more difficult to father a child. Talk to your health care professional if you are concerned about your fertility. What side effects may I notice from receiving this medication? Side effects that you should report to your doctor or health care professional as soon as  possible: allergic reactions like skin rash, itching or hives, swelling of the face, lips, or tongue chest pain diarrhea flushing, runny nose, sweating during infusion low blood counts - this medicine may decrease the number of white blood cells, red blood cells and platelets. You may be at increased risk for infections and bleeding. nausea, vomiting pain, swelling, warmth in the leg signs of decreased platelets or bleeding - bruising, pinpoint red spots on the skin, black, tarry stools, blood in the urine signs of infection - fever or chills, cough, sore throat, pain or difficulty passing urine signs of decreased red blood cells - unusually weak or tired, fainting spells, lightheadedness Side effects that usually do not require medical attention (report to your doctor or health care professional if they continue or are bothersome): constipation hair loss headache loss of appetite mouth sores stomach pain This list may not describe all possible side effects. Call your doctor  for medical advice about side effects. You may report side effects to FDA at 1-800-FDA-1088. Where should I keep my medication? This drug is given in a hospital or clinic and will not be stored at home. NOTE: This sheet is a summary. It may not cover all possible information. If you have questions about this medicine, talk to your doctor, pharmacist, or health care provider.  2022 Elsevier/Gold Standard (2019-04-02 17:46:13)

## 2021-02-23 NOTE — Progress Notes (Signed)
CBG 234

## 2021-02-23 NOTE — Progress Notes (Signed)
[name] OFFICE PROGRESS NOTE   Diagnosis:  Colon cancer  INTERVAL HISTORY:   Jeff [name] returns as scheduled.  He completed another cycle of irinotecan/panitumumab 02/05/2021.  No change in the typical nausea he experiences after chemotherapy.  No mouth sores.  No diarrhea.  Stable skin rash.  Objective:  Vital signs in last 24 hours:  Blood pressure (!) 145/80, pulse (!) 109, temperature 98.1 F (36.7 C), temperature source Oral, resp. rate 18, height $RemoveBe'5\' 10"'NBkemqdYR$  (1.778 m), weight 241 lb (109.3 kg), SpO2 100 %.    HEENT: No thrush or ulcers. Resp: Lungs clear bilaterally. Cardio: Regular rate and rhythm. GI: Abdomen soft and nontender.  No hepatomegaly. Vascular: No leg edema. Port-A-Cath without erythema  Lab Results:  Lab Results  Component Value Date   WBC 7.5 02/23/2021   HGB 13.4 02/23/2021   HCT 44.0 02/23/2021   MCV 82.7 02/23/2021   PLT 227 02/23/2021   NEUTROABS 3.8 02/23/2021    Imaging:  No results found.  Medications: I have reviewed the patient's current medications.  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, biopsy confirmed adenocarcinoma         CTs chest, abdomen, and pelvis on 12/11/2015-indeterminate tiny pulmonary nodules, multiple liver metastases, small nodes in the sigmoid mesocolon Laparoscopic sigmoid colectomy, liver biopsy, Port-A-Cath placement 01/14/2016 Pathology sigmoid colon resection- colonic adenocarcinoma, 5.3 cm extending into pericolonic connective tissue, positive lymph-vascular invasion, positive perineural invasion, negative margins, metastatic carcinoma in 9 of 28 lymph nodes Pathology liver biopsy-metastatic colorectal adenocarcinoma MSI stable; mismatch repair protein normal APC alteration, K RAS wild-type, no BRAF mutation Cycle 1 FOLFOX 02/02/2016 Cycle 2 FOLFOX 02/15/2016 Cycle 3 FOLFOX 02/29/2016 Cycle 4 FOLFOX 03/14/2016 Cycle 5  FOLFOX 03/28/2016 Cycle 6 FOLFOX 04/11/2016 (oxaliplatin held secondary to thrombocytopenia) 04/21/2016 restaging CTs-difficulty evaluating liver lesions due to hepatic steatosis. Stable right adrenal nodule. No adenopathy or local recurrence near the rectosigmoid anastomotic site. Cycle 7 FOLFOX 04/25/2016 MRI liver 05/02/2016-partial improvement in hepatic metastases Cycle 8 FOLFOX 05/10/2016 Cycle 9 FOLFOX 05/23/2016 (oxaliplatin held due to thrombocytopenia) Cycle 10 FOLFOX 06/06/2016 (oxaliplatin dose reduced due to thrombocytopenia) Cycle 11 FOLFOX 06/27/2016 (oxaliplatin held due to neuropathy) Cycle 12 FOLFOX 07/11/2016 (oxaliplatin held) Initiation of maintenance Xeloda 7 days on/7 days off 07/27/2016 MRI liver 11/18/2016-decrease in hepatic metastatic disease. No new or progressive disease identified within the abdomen. Continuation of Xeloda 7 days on/7 days off MRI liver 04/27/2017-previous liver lesions not identified, no new lesions, no lymphadenopathy Xeloda continued 7 days on/7 days off MRI liver 12/04/2017 - no evidence of metastatic disease, hepatic steatosis Xeloda continued 7 days on/7 days off MRI liver 07/15/2018- no evidence of metastatic disease.  Stable severe hepatic steatosis. Xeloda continued 7 days on/7 days off MRI liver 03/16/2019-hepatic steatosis, no liver mass, focal area of intrahepatic biliary dilatation in segments 2 and 3 of the left lobe-increased Xeloda continued 7 days on/7 days off MRI abdomen 08/19/2019-no findings to suggest liver metastases.  Bilateral lung nodules measuring up to 2.8 cm, progressive and more conspicuous than on previous exam CT chest 08/29/2019-multiple pulmonary metastases, new from 04/21/2016 Cycle 1 FOLFIRI/bevacizumab 09/09/2019 Cycle 2 FOLFIRI/bevacizumab 09/26/2019  Cycle 3 FOLFIRI/bevacizumab 10/10/2019 Cycle 4 FOLFIRI/bevacizumab 10/24/2019 Cycle 5 FOLFIRI/bevacizumab 11/07/2019 CT chest 11/14/2019-decreased size of lung nodules,  no new lesions, hepatic steatosis Cycle 6 FOLFIRI/bevacizumab 11/21/2019 Cycle 7 FOLFIRI/bevacizumab 12/05/2019 Cycle 8 FOLFIRI/bevacizumab 12/19/2019 Cycle 9 FOLFIRI/bevacizumab 01/02/2020 Cycle 10 FOLFIRI/bevacizumab 01/16/2020 CT chest 01/29/2020-stable bilateral pulmonary metastases.  No new or progressive metastatic disease in the chest. Cycle 11 FOLFIRI/bevacizumab 01/30/2020 Cycle 12 FOLFIRI/bevacizumab 02/19/2020 Cycle 13 FOLFIRI/bevacizumab 03/12/2020 Cycle 14 FOLFIRI/bevacizumab 04/02/2020 Cycle 15 FOLFIRI/bevacizumab 04/23/2020 Cycle 16 FOLFIRI/bevacizumab 05/21/2020 CT chest 06/09/2020-mild progression pulmonary metastasis.  Some lesions have increased in size while others are similar. Cycle 1 irinotecan/Panitumumab 06/18/2020 Cycle 2 irinotecan/Panitumumab 07/02/2020 Cycle 3 irinotecan/panitumumab 07/16/2020 Cycle 4 irinotecan/Panitumumab 07/30/2020 Cycle 5 irinotecan/Panitumumab 08/13/2020, Emend added Cycle 6 irinotecan/Panitumumab 08/27/2020 CT chest 09/08/2020-decreased size of pulmonary nodules, no progressive disease Cycle 7 irinotecan/panitumumab 09/02/2020 Cycle 8 irinotecan/panitumumab 09/24/2020 Cycle 9 irinotecan/Panitumumab 10/08/2020 10/22/2020 treatment held due to left foot fracture, need for surgery Cycle 10 irinotecan/panitumumab 11/24/2020 Cycle 11 irinotecan/Panitumumab 12/08/2020 Cycle 12 irinotecan/panitumumab 12/22/2020 Cycle 13 irinotecan/panitumumab 01/05/2021 01/20/2021 CT chest-mixed response with minimal increase in size of some lesions and minimal decrease in the size of other lesions.  Overall number of lesions is unchanged. Cycle 14 irinotecan/Panitumumab 02/05/2021 Cycle 15 irinotecan/Panitumumab 02/23/2021   2.   Rectal bleeding and constipation secondary to #1   3.   History of peripheral vascular disease, status post left lower extremity vascular bypass surgery in April 2017   4.   History of nephrolithiasis   5.   History of Graves' disease treated with radioactive  iodine   6.   Anxiety/depression   7.   Hypertension   8.   Hospitalization 01/19/2016 with wound dehiscence status post secondary suture closure of abdominal wall   9.   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 Hyperglycemia 06/20/2016-diagnosed with diabetes, maintained on insulin   11.  Positive COVID test 12/13/2018; positive COVID test 01/25/2021   12. Hospitalized with seizure activity/DKA. Now on Keppra, insulin adjusted. 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  Left foot fracture-surgical repair 11/11/2020      Disposition: Jeff [name] appears stable.  He continues irinotecan/Panitumumab, now on a 3-week schedule.  There is no clinical evidence of disease progression.  Plan to proceed with treatment today as scheduled.  We reviewed the CBC and chemistry panel.  Labs adequate to proceed as above.  Blood sugar is 466.  He took NovoLog 20 units this morning as prescribed.  We will repeat a fingerstick glucose in about an hour, give additional insulin if needed.  He understands to avoid concentrated sweets.  He will return for lab, follow-up, irinotecan/Panitumumab in 3 weeks.  We are available to see him sooner if needed    Ned Card ANP/GNP-BC   02/23/2021  9:24 AM

## 2021-02-25 NOTE — Progress Notes (Signed)
  Subjective:  Patient ID: Jeff [name], male    DOB: 12/01/1965,  MRN: [id number]  Chief Complaint  Patient presents with   Routine Post Op    I am a lot better and the shoe does help  on the left foot     DOS: 11/11/20 Procedure: Left foot 2nd TMT fusion, repair of midfoot dislocation   55 y.o. male presents with the above complaint.  Denies pain is wearing a surgical shoe and denies issues with the foot Objective:  Physical Exam: no tenderness at the surgical site, local edema noted, and calf supple, nontender. Incision: well healed  Assessment:   1. Lisfranc dislocation, left, subsequent encounter    Plan:  Patient was evaluated and treated and all questions answered.  Post-operative State -Improving with no pain and is walking well with a surgical shoe -Xrs taken and reviewed.  Fully bridged surgical site -Transition out of surgical shoe into sneakers.  Discussed that should the plate prove prominent we could discuss removal.  We will follow-up in a few months to evaluate  No follow-ups on file.

## 2021-03-05 ENCOUNTER — Ambulatory Visit: Payer: Medicaid Other | Admitting: Neurology

## 2021-03-05 ENCOUNTER — Encounter: Payer: Self-pay | Admitting: Oncology

## 2021-03-14 ENCOUNTER — Other Ambulatory Visit: Payer: Self-pay | Admitting: Oncology

## 2021-03-16 ENCOUNTER — Telehealth: Payer: Self-pay

## 2021-03-16 ENCOUNTER — Inpatient Hospital Stay: Payer: Medicaid Other | Attending: Oncology

## 2021-03-16 ENCOUNTER — Inpatient Hospital Stay: Payer: Medicaid Other

## 2021-03-16 ENCOUNTER — Other Ambulatory Visit: Payer: Self-pay

## 2021-03-16 ENCOUNTER — Inpatient Hospital Stay: Payer: Medicaid Other | Admitting: Oncology

## 2021-03-16 VITALS — BP 134/80 | HR 108 | Temp 98.1°F | Resp 18 | Ht 70.0 in | Wt 239.4 lb

## 2021-03-16 VITALS — HR 99

## 2021-03-16 DIAGNOSIS — C189 Malignant neoplasm of colon, unspecified: Secondary | ICD-10-CM

## 2021-03-16 DIAGNOSIS — Z23 Encounter for immunization: Secondary | ICD-10-CM

## 2021-03-16 DIAGNOSIS — Z5112 Encounter for antineoplastic immunotherapy: Secondary | ICD-10-CM | POA: Insufficient documentation

## 2021-03-16 DIAGNOSIS — Z5111 Encounter for antineoplastic chemotherapy: Secondary | ICD-10-CM | POA: Diagnosis present

## 2021-03-16 DIAGNOSIS — C787 Secondary malignant neoplasm of liver and intrahepatic bile duct: Secondary | ICD-10-CM | POA: Diagnosis not present

## 2021-03-16 DIAGNOSIS — Z452 Encounter for adjustment and management of vascular access device: Secondary | ICD-10-CM | POA: Diagnosis not present

## 2021-03-16 DIAGNOSIS — C187 Malignant neoplasm of sigmoid colon: Secondary | ICD-10-CM

## 2021-03-16 LAB — CBC WITH DIFFERENTIAL (CANCER CENTER ONLY)
Abs Immature Granulocytes: 0.06 10*3/uL (ref 0.00–0.07)
Basophils Absolute: 0 10*3/uL (ref 0.0–0.1)
Basophils Relative: 0 %
Eosinophils Absolute: 0.3 10*3/uL (ref 0.0–0.5)
Eosinophils Relative: 3 %
HCT: 45.4 % (ref 39.0–52.0)
Hemoglobin: 13.5 g/dL (ref 13.0–17.0)
Immature Granulocytes: 1 %
Lymphocytes Relative: 30 %
Lymphs Abs: 2.7 10*3/uL (ref 0.7–4.0)
MCH: 24.9 pg — ABNORMAL LOW (ref 26.0–34.0)
MCHC: 29.7 g/dL — ABNORMAL LOW (ref 30.0–36.0)
MCV: 83.6 fL (ref 80.0–100.0)
Monocytes Absolute: 0.9 10*3/uL (ref 0.1–1.0)
Monocytes Relative: 10 %
Neutro Abs: 5.1 10*3/uL (ref 1.7–7.7)
Neutrophils Relative %: 56 %
Platelet Count: 219 10*3/uL (ref 150–400)
RBC: 5.43 MIL/uL (ref 4.22–5.81)
RDW: 16.2 % — ABNORMAL HIGH (ref 11.5–15.5)
WBC Count: 9.1 10*3/uL (ref 4.0–10.5)
nRBC: 0 % (ref 0.0–0.2)

## 2021-03-16 LAB — CMP (CANCER CENTER ONLY)
ALT: 38 U/L (ref 0–44)
AST: 37 U/L (ref 15–41)
Albumin: 3.9 g/dL (ref 3.5–5.0)
Alkaline Phosphatase: 111 U/L (ref 38–126)
Anion gap: 12 (ref 5–15)
BUN: 7 mg/dL (ref 6–20)
CO2: 25 mmol/L (ref 22–32)
Calcium: 8.6 mg/dL — ABNORMAL LOW (ref 8.9–10.3)
Chloride: 100 mmol/L (ref 98–111)
Creatinine: 1.14 mg/dL (ref 0.61–1.24)
GFR, Estimated: >60 mL/min (ref >60)
Glucose, Bld: 250 mg/dL — ABNORMAL HIGH (ref 70–99)
Potassium: 3.6 mmol/L (ref 3.5–5.1)
Sodium: 137 mmol/L (ref 135–145)
Total Bilirubin: 0.5 mg/dL (ref 0.3–1.2)
Total Protein: 7.4 g/dL (ref 6.5–8.1)

## 2021-03-16 LAB — CEA (ACCESS): CEA (CHCC): 8.51 ng/mL — ABNORMAL HIGH (ref 0.00–5.00)

## 2021-03-16 LAB — MAGNESIUM: Magnesium: 1.5 mg/dL — ABNORMAL LOW (ref 1.7–2.4)

## 2021-03-16 MED ORDER — SODIUM CHLORIDE 0.9 % IV SOLN
180.0000 mg/m2 | Freq: Once | INTRAVENOUS | Status: AC
  Administered 2021-03-16: 420 mg via INTRAVENOUS
  Filled 2021-03-16: qty 15

## 2021-03-16 MED ORDER — SODIUM CHLORIDE 0.9 % IV SOLN: Freq: Once | INTRAVENOUS | Status: AC

## 2021-03-16 MED ORDER — SODIUM CHLORIDE 0.9 % IV SOLN
150.0000 mg | Freq: Once | INTRAVENOUS | Status: AC
  Administered 2021-03-16: 150 mg via INTRAVENOUS
  Filled 2021-03-16: qty 5

## 2021-03-16 MED ORDER — MAGNESIUM SULFATE 2 GM/50ML IV SOLN
2.0000 g | Freq: Once | INTRAVENOUS | Status: AC
  Administered 2021-03-16: 2 g via INTRAVENOUS
  Filled 2021-03-16: qty 50

## 2021-03-16 MED ORDER — DEXAMETHASONE SODIUM PHOSPHATE 10 MG/ML IJ SOLN
5.0000 mg | Freq: Once | INTRAMUSCULAR | Status: AC
  Administered 2021-03-16: 5 mg via INTRAVENOUS
  Filled 2021-03-16: qty 1

## 2021-03-16 MED ORDER — HEPARIN SOD (PORK) LOCK FLUSH 100 UNIT/ML IV SOLN
500.0000 [IU] | Freq: Once | INTRAVENOUS | Status: AC | PRN
  Administered 2021-03-16: 500 [IU]

## 2021-03-16 MED ORDER — PALONOSETRON HCL INJECTION 0.25 MG/5ML
0.2500 mg | Freq: Once | INTRAVENOUS | Status: AC
  Administered 2021-03-16: 0.25 mg via INTRAVENOUS
  Filled 2021-03-16: qty 5

## 2021-03-16 MED ORDER — ATROPINE SULFATE 1 MG/ML IV SOLN
0.4000 mg | Freq: Once | INTRAVENOUS | Status: AC
  Administered 2021-03-16: 0.4 mg via INTRAVENOUS
  Filled 2021-03-16: qty 1

## 2021-03-16 MED ORDER — SODIUM CHLORIDE 0.9 % IV SOLN
6.0000 mg/kg | Freq: Once | INTRAVENOUS | Status: AC
  Administered 2021-03-16: 700 mg via INTRAVENOUS
  Filled 2021-03-16: qty 20

## 2021-03-16 MED ORDER — SODIUM CHLORIDE 0.9% FLUSH
10.0000 mL | INTRAVENOUS | Status: DC | PRN
  Administered 2021-03-16: 10 mL

## 2021-03-16 MED ORDER — INFLUENZA VAC SPLIT QUAD 0.5 ML IM SUSY
0.5000 mL | PREFILLED_SYRINGE | Freq: Once | INTRAMUSCULAR | Status: AC
  Administered 2021-03-16: 0.5 mL via INTRAMUSCULAR
  Filled 2021-03-16: qty 0.5

## 2021-03-16 NOTE — Patient Instructions (Signed)
[facility]   Discharge Instructions: Thank you for choosing [facility] to provide your oncology and hematology care.   If you have a lab appointment with the [facility], please go directly to the [facility] and check in at the registration area.   Wear comfortable clothing and clothing appropriate for easy access to any Portacath or PICC line.   We strive to give you quality time with your provider. You may need to reschedule your appointment if you arrive late (15 or more minutes).  Arriving late affects you and other patients whose appointments are after yours.  Also, if you miss three or more appointments without notifying the office, you may be dismissed from the clinic at the provider's discretion.      For prescription refill requests, have your pharmacy contact our office and allow 72 hours for refills to be completed.    Today you received the following chemotherapy and/or immunotherapy agents Panitumumab (VECTIBIX) & Irinotecan (CAMPTOSAR).      To help prevent nausea and vomiting after your treatment, we encourage you to take your nausea medication as directed.  BELOW ARE SYMPTOMS THAT SHOULD BE REPORTED IMMEDIATELY: *FEVER GREATER THAN 100.4 F (38 C) OR HIGHER *CHILLS OR SWEATING *NAUSEA AND VOMITING THAT IS NOT CONTROLLED WITH YOUR NAUSEA MEDICATION *UNUSUAL SHORTNESS OF BREATH *UNUSUAL BRUISING OR BLEEDING *URINARY PROBLEMS (pain or burning when urinating, or frequent urination) *BOWEL PROBLEMS (unusual diarrhea, constipation, pain near the anus) TENDERNESS IN MOUTH AND THROAT WITH OR WITHOUT PRESENCE OF ULCERS (sore throat, sores in mouth, or a toothache) UNUSUAL RASH, SWELLING OR PAIN  UNUSUAL VAGINAL DISCHARGE OR ITCHING   Items with * indicate a potential emergency and should be followed up as soon as possible or go to the Emergency Department if any problems should occur.  Please show the CHEMOTHERAPY ALERT CARD or  IMMUNOTHERAPY ALERT CARD at check-in to the Emergency Department and triage nurse.  Should you have questions after your visit or need to cancel or reschedule your appointment, please contact [facility]  Dept: [phone]  and follow the prompts.  Office hours are 8:00 a.m. to 4:30 p.m. Monday - Friday. Please note that voicemails left after 4:00 p.m. may not be returned until the following business day.  We are closed weekends and major holidays. You have access to a nurse at all times for urgent questions. Please call the main number to the clinic Dept: [phone] and follow the prompts.   For any non-urgent questions, you may also contact your provider using MyChart. We now offer e-Visits for anyone 55 and older to request care online for non-urgent symptoms. For details visit [email or web address].   Also download the MyChart app! Go to the app store, search "MyChart", open the app, select [facility], and log in with your MyChart username and password.  Due to Covid, a mask is required upon entering the hospital/clinic. If you do not have a mask, one will be given to you upon arrival. For doctor visits, patients may have 1 support person aged 61 or older with them. For treatment visits, patients cannot have anyone with them due to current Covid guidelines and our immunocompromised population.   Panitumumab Solution for Injection What is this medication? PANITUMUMAB (pan i TOOM ue mab) is a monoclonal antibody. It is used to treat colorectal cancer. This medicine may be used for other purposes; ask your health care provider or pharmacist if you have questions.  COMMON BRAND NAME(S): Vectibix What should I tell my care team before I take this medication? They need to know if you have any of these conditions: eye disease, vision problems low levels of calcium, magnesium, or potassium in the blood lung or breathing disease, like asthma skin conditions or  sensitivity an unusual or allergic reaction to panitumumab, other medicines, foods, dyes, or preservatives pregnant or trying to get pregnant breast-feeding How should I use this medication? This drug is given as an infusion into a vein. It is administered in a hospital or clinic by a specially trained health care professional. Talk to your pediatrician regarding the use of this medicine in children. Special care may be needed. Overdosage: If you think you have taken too much of this medicine contact a poison control center or emergency room at once. NOTE: This medicine is only for you. Do not share this medicine with others. What if I miss a dose? It is important not to miss your dose. Call your doctor or health care professional if you are unable to keep an appointment. What may interact with this medication? Do not take this medicine with any of the following medications: bevacizumab This list may not describe all possible interactions. Give your health care provider a list of all the medicines, herbs, non-prescription drugs, or dietary supplements you use. Also tell them if you smoke, drink alcohol, or use illegal drugs. Some items may interact with your medicine. What should I watch for while using this medication? Visit your doctor for checks on your progress. This drug may make you feel generally unwell. This is not uncommon, as chemotherapy can affect healthy cells as well as cancer cells. Report any side effects. Continue your course of treatment even though you feel ill unless your doctor tells you to stop. This medicine can make you more sensitive to the sun. Keep out of the sun while receiving this medicine and for 2 months after the last dose. If you cannot avoid being in the sun, wear protective clothing and use sunscreen. Do not use sun lamps or tanning beds/booths. In some cases, you may be given additional medicines to help with side effects. Follow all directions for their  use. Call your doctor or health care professional for advice if you get a fever, chills or sore throat, or other symptoms of a cold or flu. Do not treat yourself. This drug decreases your body's ability to fight infections. Try to avoid being around people who are sick. Avoid taking products that contain aspirin, acetaminophen, ibuprofen, naproxen, or ketoprofen unless instructed by your doctor. These medicines may hide a fever. Do not become pregnant while taking this medicine and for 2 months after the last dose. Women should inform their doctor if they wish to become pregnant or think they might be pregnant. There is a potential for serious side effects to an unborn child. Talk to your health care professional or pharmacist for more information. Do not breast-feed an infant while taking this medicine or for 2 months after the last dose. What side effects may I notice from receiving this medication? Side effects that you should report to your doctor or health care professional as soon as possible: allergic reactions like skin rash, itching or hives, swelling of the face, lips, or tongue breathing problems changes in vision eye pain fast, irregular heartbeat fever, chills mouth sores red spots on the skin redness, blistering, peeling or loosening of the skin, including inside the mouth signs and symptoms of  kidney injury like trouble passing urine or change in the amount of urine signs and symptoms of low blood pressure like dizziness; feeling faint or lightheaded, falls; unusually weak or tired signs of low calcium like fast heartbeat, muscle cramps or muscle pain; pain, tingling, numbness in the hands or feet; seizures signs and symptoms of low magnesium like muscle cramps, pain, or weakness; tremors; seizures; or fast, irregular heartbeat signs and symptoms of low potassium like muscle cramps or muscle pain; chest pain; dizziness; feeling faint or lightheaded, falls; palpitations; breathing  problems; or fast, irregular heartbeat swelling of the ankles, feet, hands Side effects that usually do not require medical attention (report to your doctor or health care professional if they continue or are bothersome): changes in skin like acne, cracks, skin dryness diarrhea eyelash growth headache mouth sores nail changes nausea, vomiting This list may not describe all possible side effects. Call your doctor for medical advice about side effects. You may report side effects to FDA at 1-800-FDA-1088. Where should I keep my medication? This drug is given in a hospital or clinic and will not be stored at home. NOTE: This sheet is a summary. It may not cover all possible information. If you have questions about this medicine, talk to your doctor, pharmacist, or health care provider.  2022 Elsevier/Gold Standard (2015-11-20 16:45:04)  Irinotecan injection What is this medication? IRINOTECAN (ir in oh TEE kan ) is a chemotherapy drug. It is used to treat colon and rectal cancer. This medicine may be used for other purposes; ask your health care provider or pharmacist if you have questions. COMMON BRAND NAME(S): Camptosar What should I tell my care team before I take this medication? They need to know if you have any of these conditions: dehydration diarrhea infection (especially a virus infection such as chickenpox, cold sores, or herpes) liver disease low blood counts, like low white cell, platelet, or red cell counts low levels of calcium, magnesium, or potassium in the blood recent or ongoing radiation therapy an unusual or allergic reaction to irinotecan, other medicines, foods, dyes, or preservatives pregnant or trying to get pregnant breast-feeding How should I use this medication? This drug is given as an infusion into a vein. It is administered in a hospital or clinic by a specially trained health care professional. Talk to your pediatrician regarding the use of this  medicine in children. Special care may be needed. Overdosage: If you think you have taken too much of this medicine contact a poison control center or emergency room at once. NOTE: This medicine is only for you. Do not share this medicine with others. What if I miss a dose? It is important not to miss your dose. Call your doctor or health care professional if you are unable to keep an appointment. What may interact with this medication? Do not take this medicine with any of the following medications: cobicistat itraconazole This medicine may interact with the following medications: antiviral medicines for HIV or AIDS certain antibiotics like rifampin or rifabutin certain medicines for fungal infections like ketoconazole, posaconazole, and voriconazole certain medicines for seizures like carbamazepine, phenobarbital, phenotoin clarithromycin gemfibrozil nefazodone [facility] John's Wort This list may not describe all possible interactions. Give your health care provider a list of all the medicines, herbs, non-prescription drugs, or dietary supplements you use. Also tell them if you smoke, drink alcohol, or use illegal drugs. Some items may interact with your medicine. What should I watch for while using this medication? Your condition will  be monitored carefully while you are receiving this medicine. You will need important blood work done while you are taking this medicine. This drug may make you feel generally unwell. This is not uncommon, as chemotherapy can affect healthy cells as well as cancer cells. Report any side effects. Continue your course of treatment even though you feel ill unless your doctor tells you to stop. In some cases, you may be given additional medicines to help with side effects. Follow all directions for their use. You may get drowsy or dizzy. Do not drive, use machinery, or do anything that needs mental alertness until you know how this medicine affects you. Do not stand or  sit up quickly, especially if you are an older patient. This reduces the risk of dizzy or fainting spells. Call your health care professional for advice if you get a fever, chills, or sore throat, or other symptoms of a cold or flu. Do not treat yourself. This medicine decreases your body's ability to fight infections. Try to avoid being around people who are sick. Avoid taking products that contain aspirin, acetaminophen, ibuprofen, naproxen, or ketoprofen unless instructed by your doctor. These medicines may hide a fever. This medicine may increase your risk to bruise or bleed. Call your doctor or health care professional if you notice any unusual bleeding. Be careful brushing and flossing your teeth or using a toothpick because you may get an infection or bleed more easily. If you have any dental work done, tell your dentist you are receiving this medicine. Do not become pregnant while taking this medicine or for 6 months after stopping it. Women should inform their health care professional if they wish to become pregnant or think they might be pregnant. Men should not father a child while taking this medicine and for 3 months after stopping it. There is potential for serious side effects to an unborn child. Talk to your health care professional for more information. Do not breast-feed an infant while taking this medicine or for 7 days after stopping it. This medicine has caused ovarian failure in some women. This medicine may make it more difficult to get pregnant. Talk to your health care professional if you are concerned about your fertility. This medicine has caused decreased sperm counts in some men. This may make it more difficult to father a child. Talk to your health care professional if you are concerned about your fertility. What side effects may I notice from receiving this medication? Side effects that you should report to your doctor or health care professional as soon as  possible: allergic reactions like skin rash, itching or hives, swelling of the face, lips, or tongue chest pain diarrhea flushing, runny nose, sweating during infusion low blood counts - this medicine may decrease the number of white blood cells, red blood cells and platelets. You may be at increased risk for infections and bleeding. nausea, vomiting pain, swelling, warmth in the leg signs of decreased platelets or bleeding - bruising, pinpoint red spots on the skin, black, tarry stools, blood in the urine signs of infection - fever or chills, cough, sore throat, pain or difficulty passing urine signs of decreased red blood cells - unusually weak or tired, fainting spells, lightheadedness Side effects that usually do not require medical attention (report to your doctor or health care professional if they continue or are bothersome): constipation hair loss headache loss of appetite mouth sores stomach pain This list may not describe all possible side effects. Call your doctor  for medical advice about side effects. You may report side effects to FDA at 1-800-FDA-1088. Where should I keep my medication? This drug is given in a hospital or clinic and will not be stored at home. NOTE: This sheet is a summary. It may not cover all possible information. If you have questions about this medicine, talk to your doctor, pharmacist, or health care provider.  2022 Elsevier/Gold Standard (2019-04-02 17:46:13)

## 2021-03-16 NOTE — Telephone Encounter (Signed)
Patient seen by Dr. Benay Spice today  Vitals are within treatment parameters.  Labs reviewed by Dr. Benay Spice and are not all within treatment parameters. Magnesium  1.5 Ok to treat per Dr Benay Spice   Per physician team, patient is ready for treatment and there are NO modifications to the treatment plan.

## 2021-03-16 NOTE — Progress Notes (Signed)
[name] OFFICE PROGRESS NOTE   Diagnosis: Colon cancer  INTERVAL HISTORY:   Mr Jeff [name] completed another cycle of irinotecan/panitumumab on 02/23/2021.  No nausea/vomiting, mouth sores, or diarrhea.  He has a persistent rash over the chest/back and face.  He uses a steroid cream.  He developed nausea and vomiting when he arrived to the cancer center today.  He says he has felt this way in the past when arriving for treatment.  Objective:  Vital signs in last 24 hours:  Blood pressure 134/80, pulse (!) 108, temperature 98.1 F (36.7 C), resp. rate 18, height $RemoveBe'5\' 10"'vjMWpKeWr$  (1.778 m), weight 239 lb 6.4 oz (108.6 kg), SpO2 100 %.    HEENT: No thrush or ulcers Resp: Lungs clear bilaterally Cardio: Regular rate and rhythm GI: No hepatosplenomegaly Vascular: No leg edema  Skin: Palms without erythema or skin breakdown, mild acne type rash over the chest/back and face  Portacath/PICC-without erythema  Lab Results:  Lab Results  Component Value Date   WBC 9.1 03/16/2021   HGB 13.5 03/16/2021   HCT 45.4 03/16/2021   MCV 83.6 03/16/2021   PLT 219 03/16/2021   NEUTROABS 5.1 03/16/2021    CMP  Lab Results  Component Value Date   NA 137 02/23/2021   K 3.7 02/23/2021   CL 101 02/23/2021   CO2 24 02/23/2021   GLUCOSE 466 (H) 02/23/2021   BUN 6 02/23/2021   CREATININE 1.06 02/23/2021   CALCIUM 9.2 02/23/2021   PROT 7.4 02/23/2021   ALBUMIN 4.0 02/23/2021   AST 38 02/23/2021   ALT 42 02/23/2021   ALKPHOS 114 02/23/2021   BILITOT 0.4 02/23/2021   GFRNONAA >60 02/23/2021   GFRAA >60 01/30/2020    Lab Results  Component Value Date   CEA1 3.19 09/10/2020   CEA 6.51 (H) 02/23/2021      Medications: I have reviewed the patient's current medications.   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, biopsy confirmed adenocarcinoma         CTs chest, abdomen, and pelvis on  12/11/2015-indeterminate tiny pulmonary nodules, multiple liver metastases, small nodes in the sigmoid mesocolon Laparoscopic sigmoid colectomy, liver biopsy, Port-A-Cath placement 01/14/2016 Pathology sigmoid colon resection- colonic adenocarcinoma, 5.3 cm extending into pericolonic connective tissue, positive lymph-vascular invasion, positive perineural invasion, negative margins, metastatic carcinoma in 9 of 28 lymph nodes Pathology liver biopsy-metastatic colorectal adenocarcinoma MSI stable; mismatch repair protein normal APC alteration, K RAS wild-type, no BRAF mutation Cycle 1 FOLFOX 02/02/2016 Cycle 2 FOLFOX 02/15/2016 Cycle 3 FOLFOX 02/29/2016 Cycle 4 FOLFOX 03/14/2016 Cycle 5 FOLFOX 03/28/2016 Cycle 6 FOLFOX 04/11/2016 (oxaliplatin held secondary to thrombocytopenia) 04/21/2016 restaging CTs-difficulty evaluating liver lesions due to hepatic steatosis. Stable right adrenal nodule. No adenopathy or local recurrence near the rectosigmoid anastomotic site. Cycle 7 FOLFOX 04/25/2016 MRI liver 05/02/2016-partial improvement in hepatic metastases Cycle 8 FOLFOX 05/10/2016 Cycle 9 FOLFOX 05/23/2016 (oxaliplatin held due to thrombocytopenia) Cycle 10 FOLFOX 06/06/2016 (oxaliplatin dose reduced due to thrombocytopenia) Cycle 11 FOLFOX 06/27/2016 (oxaliplatin held due to neuropathy) Cycle 12 FOLFOX 07/11/2016 (oxaliplatin held) Initiation of maintenance Xeloda 7 days on/7 days off 07/27/2016 MRI liver 11/18/2016-decrease in hepatic metastatic disease. No new or progressive disease identified within the abdomen. Continuation of Xeloda 7 days on/7 days off MRI liver 04/27/2017-previous liver lesions not identified, no new lesions, no lymphadenopathy Xeloda continued 7 days on/7 days off MRI liver 12/04/2017 - no evidence of metastatic disease, hepatic steatosis Xeloda continued 7 days on/7  days off MRI liver 07/15/2018- no evidence of metastatic disease.  Stable severe hepatic  steatosis. Xeloda continued 7 days on/7 days off MRI liver 03/16/2019-hepatic steatosis, no liver mass, focal area of intrahepatic biliary dilatation in segments 2 and 3 of the left lobe-increased Xeloda continued 7 days on/7 days off MRI abdomen 08/19/2019-no findings to suggest liver metastases.  Bilateral lung nodules measuring up to 2.8 cm, progressive and more conspicuous than on previous exam CT chest 08/29/2019-multiple pulmonary metastases, new from 04/21/2016 Cycle 1 FOLFIRI/bevacizumab 09/09/2019 Cycle 2 FOLFIRI/bevacizumab 09/26/2019  Cycle 3 FOLFIRI/bevacizumab 10/10/2019 Cycle 4 FOLFIRI/bevacizumab 10/24/2019 Cycle 5 FOLFIRI/bevacizumab 11/07/2019 CT chest 11/14/2019-decreased size of lung nodules, no new lesions, hepatic steatosis Cycle 6 FOLFIRI/bevacizumab 11/21/2019 Cycle 7 FOLFIRI/bevacizumab 12/05/2019 Cycle 8 FOLFIRI/bevacizumab 12/19/2019 Cycle 9 FOLFIRI/bevacizumab 01/02/2020 Cycle 10 FOLFIRI/bevacizumab 01/16/2020 CT chest 01/29/2020-stable bilateral pulmonary metastases.  No new or progressive metastatic disease in the chest. Cycle 11 FOLFIRI/bevacizumab 01/30/2020 Cycle 12 FOLFIRI/bevacizumab 02/19/2020 Cycle 13 FOLFIRI/bevacizumab 03/12/2020 Cycle 14 FOLFIRI/bevacizumab 04/02/2020 Cycle 15 FOLFIRI/bevacizumab 04/23/2020 Cycle 16 FOLFIRI/bevacizumab 05/21/2020 CT chest 06/09/2020-mild progression pulmonary metastasis.  Some lesions have increased in size while others are similar. Cycle 1 irinotecan/Panitumumab 06/18/2020 Cycle 2 irinotecan/Panitumumab 07/02/2020 Cycle 3 irinotecan/panitumumab 07/16/2020 Cycle 4 irinotecan/Panitumumab 07/30/2020 Cycle 5 irinotecan/Panitumumab 08/13/2020, Emend added Cycle 6 irinotecan/Panitumumab 08/27/2020 CT chest 09/08/2020-decreased size of pulmonary nodules, no progressive disease Cycle 7 irinotecan/panitumumab 09/02/2020 Cycle 8 irinotecan/panitumumab 09/24/2020 Cycle 9 irinotecan/Panitumumab 10/08/2020 10/22/2020 treatment held due to left foot fracture,  need for surgery Cycle 10 irinotecan/panitumumab 11/24/2020 Cycle 11 irinotecan/Panitumumab 12/08/2020 Cycle 12 irinotecan/panitumumab 12/22/2020 Cycle 13 irinotecan/panitumumab 01/05/2021 01/20/2021 CT chest-mixed response with minimal increase in size of some lesions and minimal decrease in the size of other lesions.  Overall number of lesions is unchanged. Cycle 14 irinotecan/Panitumumab 02/05/2021 Cycle 15 irinotecan/Panitumumab 02/23/2021 Cycle 16 irinotecan/panitumumab 03/16/2021   2.   Rectal bleeding and constipation secondary to #1   3.   History of peripheral vascular disease, status post left lower extremity vascular bypass surgery in April 2017   4.   History of nephrolithiasis   5.   History of Graves' disease treated with radioactive iodine   6.   Anxiety/depression   7.   Hypertension   8.   Hospitalization 01/19/2016 with wound dehiscence status post secondary suture closure of abdominal wall   9.   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 Hyperglycemia 06/20/2016-diagnosed with diabetes, maintained on insulin   11.  Positive COVID test 12/13/2018; positive COVID test 01/25/2021   12. Hospitalized with seizure activity/DKA. Now on Keppra, insulin adjusted. 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  Left foot fracture-surgical repair 11/11/2020        Disposition: Mr. Wolfson appears unchanged.  He had an episode of anticipatory nausea/vomiting when he arrived to the cancer center today.  The nausea has improved.  The plan is to proceed with irinotecan/panitumumab.  We will follow-up on the CEA from today.  He plans a trip out of town for the Thanksgiving holiday.  He will return for an office visit in the next  cycle of chemotherapy on 04/13/2021.  He will be referred for a restaging CT after the next cycle of chemotherapy.  Mr. Sax will receive an influenza vaccine today.  Betsy Coder, MD  03/16/2021  9:03 AM

## 2021-03-16 NOTE — Progress Notes (Signed)
Patient presents for treatment. RN assessment completed along with the following:  Labs/vitals reviewed - Yes, and Per Evalee Mutton note on 03/16/21 at 933, ok to treat with Mg 1.5.     Weight within 10% of previous measurement - Yes Oncology Treatment Attestation completed for current therapy- Yes, on date 06/12/20 Informed consent completed and reflects current therapy/intent - Yes, on date 06/18/20             Provider progress note reviewed - Yes, today's provider note was reviewed. Treatment/Antibody/Supportive plan reviewed - Yes, and there are no adjustments needed for today's treatment. S&H and other orders reviewed - Yes, and flu shot has been added. Previous treatment date reviewed - Yes, and the appropriate amount of time has elapsed between treatments.   Patient to proceed with treatment.

## 2021-04-01 ENCOUNTER — Other Ambulatory Visit: Payer: Self-pay | Admitting: *Deleted

## 2021-04-01 ENCOUNTER — Ambulatory Visit (INDEPENDENT_AMBULATORY_CARE_PROVIDER_SITE_OTHER): Payer: Medicaid Other | Admitting: Podiatry

## 2021-04-01 DIAGNOSIS — Z91199 Patient's noncompliance with other medical treatment and regimen due to unspecified reason: Secondary | ICD-10-CM

## 2021-04-01 NOTE — Progress Notes (Signed)
No-show for final post-op check

## 2021-04-11 ENCOUNTER — Other Ambulatory Visit: Payer: Self-pay | Admitting: Oncology

## 2021-04-13 ENCOUNTER — Inpatient Hospital Stay: Payer: Medicaid Other | Admitting: Nurse Practitioner

## 2021-04-13 ENCOUNTER — Encounter: Payer: Self-pay | Admitting: Nurse Practitioner

## 2021-04-13 ENCOUNTER — Inpatient Hospital Stay: Payer: Medicaid Other

## 2021-04-13 ENCOUNTER — Other Ambulatory Visit: Payer: Self-pay

## 2021-04-13 VITALS — BP 132/78 | HR 114 | Temp 98.0°F | Resp 16 | Ht 70.0 in | Wt 240.2 lb

## 2021-04-13 VITALS — BP 135/72 | HR 99 | Temp 98.7°F | Resp 16

## 2021-04-13 DIAGNOSIS — C189 Malignant neoplasm of colon, unspecified: Secondary | ICD-10-CM

## 2021-04-13 DIAGNOSIS — C787 Secondary malignant neoplasm of liver and intrahepatic bile duct: Secondary | ICD-10-CM

## 2021-04-13 DIAGNOSIS — Z5112 Encounter for antineoplastic immunotherapy: Secondary | ICD-10-CM | POA: Diagnosis not present

## 2021-04-13 DIAGNOSIS — Z95828 Presence of other vascular implants and grafts: Secondary | ICD-10-CM

## 2021-04-13 DIAGNOSIS — C187 Malignant neoplasm of sigmoid colon: Secondary | ICD-10-CM

## 2021-04-13 LAB — CBC WITH DIFFERENTIAL (CANCER CENTER ONLY)
Abs Immature Granulocytes: 0.04 10*3/uL (ref 0.00–0.07)
Basophils Absolute: 0 10*3/uL (ref 0.0–0.1)
Basophils Relative: 0 %
Eosinophils Absolute: 0.3 10*3/uL (ref 0.0–0.5)
Eosinophils Relative: 3 %
HCT: 44.7 % (ref 39.0–52.0)
Hemoglobin: 13.1 g/dL (ref 13.0–17.0)
Immature Granulocytes: 1 %
Lymphocytes Relative: 25 %
Lymphs Abs: 2.2 10*3/uL (ref 0.7–4.0)
MCH: 24.5 pg — ABNORMAL LOW (ref 26.0–34.0)
MCHC: 29.3 g/dL — ABNORMAL LOW (ref 30.0–36.0)
MCV: 83.6 fL (ref 80.0–100.0)
Monocytes Absolute: 0.8 10*3/uL (ref 0.1–1.0)
Monocytes Relative: 9 %
Neutro Abs: 5.5 10*3/uL (ref 1.7–7.7)
Neutrophils Relative %: 62 %
Platelet Count: 183 10*3/uL (ref 150–400)
RBC: 5.35 MIL/uL (ref 4.22–5.81)
RDW: 16.2 % — ABNORMAL HIGH (ref 11.5–15.5)
WBC Count: 8.9 10*3/uL (ref 4.0–10.5)
nRBC: 0 % (ref 0.0–0.2)

## 2021-04-13 LAB — CMP (CANCER CENTER ONLY)
ALT: 39 U/L (ref 0–44)
AST: 46 U/L — ABNORMAL HIGH (ref 15–41)
Albumin: 4.3 g/dL (ref 3.5–5.0)
Alkaline Phosphatase: 110 U/L (ref 38–126)
Anion gap: 12 (ref 5–15)
BUN: 5 mg/dL — ABNORMAL LOW (ref 6–20)
CO2: 22 mmol/L (ref 22–32)
Calcium: 9.6 mg/dL (ref 8.9–10.3)
Chloride: 105 mmol/L (ref 98–111)
Creatinine: 0.82 mg/dL (ref 0.61–1.24)
GFR, Estimated: >60 mL/min (ref >60)
Glucose, Bld: 199 mg/dL — ABNORMAL HIGH (ref 70–99)
Potassium: 3.7 mmol/L (ref 3.5–5.1)
Sodium: 139 mmol/L (ref 135–145)
Total Bilirubin: 0.5 mg/dL (ref 0.3–1.2)
Total Protein: 7.4 g/dL (ref 6.5–8.1)

## 2021-04-13 LAB — CEA (ACCESS): CEA (CHCC): 7.25 ng/mL — ABNORMAL HIGH (ref 0.00–5.00)

## 2021-04-13 LAB — MAGNESIUM: Magnesium: 1.5 mg/dL — ABNORMAL LOW (ref 1.7–2.4)

## 2021-04-13 MED ORDER — SODIUM CHLORIDE 0.9 % IV SOLN
Freq: Once | INTRAVENOUS | Status: AC
Start: 2021-04-13 — End: 2021-04-13

## 2021-04-13 MED ORDER — MAGNESIUM OXIDE -MG SUPPLEMENT 400 (240 MG) MG PO TABS: 400.0000 mg | ORAL_TABLET | Freq: Three times a day (TID) | ORAL | 1 refills | Status: DC

## 2021-04-13 MED ORDER — LORAZEPAM 0.5 MG PO TABS: 0.5000 mg | ORAL_TABLET | Freq: Three times a day (TID) | ORAL | 0 refills | Status: DC | PRN

## 2021-04-13 MED ORDER — SODIUM CHLORIDE 0.9 % IV SOLN
150.0000 mg | Freq: Once | INTRAVENOUS | Status: AC
  Administered 2021-04-13: 150 mg via INTRAVENOUS
  Filled 2021-04-13: qty 5

## 2021-04-13 MED ORDER — DIPHENOXYLATE-ATROPINE 2.5-0.025 MG PO TABS: 2.0000 | ORAL_TABLET | Freq: Four times a day (QID) | ORAL | 0 refills | Status: DC | PRN

## 2021-04-13 MED ORDER — ATROPINE SULFATE 1 MG/ML IV SOLN
0.4000 mg | Freq: Once | INTRAVENOUS | Status: AC
  Administered 2021-04-13: 0.4 mg via INTRAVENOUS

## 2021-04-13 MED ORDER — MAGNESIUM SULFATE 2 GM/50ML IV SOLN
2.0000 g | Freq: Once | INTRAVENOUS | Status: AC
  Administered 2021-04-13: 2 g via INTRAVENOUS

## 2021-04-13 MED ORDER — ALTEPLASE 2 MG IJ SOLR
2.0000 mg | Freq: Once | INTRAMUSCULAR | Status: AC | PRN
  Administered 2021-04-13: 2 mg

## 2021-04-13 MED ORDER — HEPARIN SOD (PORK) LOCK FLUSH 100 UNIT/ML IV SOLN
500.0000 [IU] | Freq: Once | INTRAVENOUS | Status: AC | PRN
  Administered 2021-04-13: 500 [IU]

## 2021-04-13 MED ORDER — PALONOSETRON HCL INJECTION 0.25 MG/5ML
0.2500 mg | Freq: Once | INTRAVENOUS | Status: AC
  Administered 2021-04-13: 0.25 mg via INTRAVENOUS

## 2021-04-13 MED ORDER — SODIUM CHLORIDE 0.9% FLUSH
10.0000 mL | INTRAVENOUS | Status: DC | PRN
  Administered 2021-04-13: 10 mL

## 2021-04-13 MED ORDER — SODIUM CHLORIDE 0.9 % IV SOLN
6.0000 mg/kg | Freq: Once | INTRAVENOUS | Status: AC
  Administered 2021-04-13: 700 mg via INTRAVENOUS
  Filled 2021-04-13: qty 20

## 2021-04-13 MED ORDER — DEXAMETHASONE SODIUM PHOSPHATE 10 MG/ML IJ SOLN
5.0000 mg | Freq: Once | INTRAMUSCULAR | Status: AC
  Administered 2021-04-13: 5 mg via INTRAVENOUS

## 2021-04-13 MED ORDER — SODIUM CHLORIDE 0.9 % IV SOLN
180.0000 mg/m2 | Freq: Once | INTRAVENOUS | Status: AC
  Administered 2021-04-13: 420 mg via INTRAVENOUS
  Filled 2021-04-13: qty 15

## 2021-04-13 NOTE — Progress Notes (Signed)
[place] OFFICE PROGRESS NOTE   Diagnosis: Colon cancer  INTERVAL HISTORY:   Jeff [name] returns as scheduled.  He completed another cycle of irinotecan/Panitumumab 03/16/2021.  No significant nausea/vomiting.  No mouth sores.  He developed diarrhea about 1 week after treatment.  He was having multiple loose stools a day.  This lasted for about 10 days per his wife's report.  He took Imodium with minimal improvement.  Bowel habits now at baseline.  Objective:  Vital signs in last 24 hours:  Blood pressure 132/78, pulse (!) 114, temperature 98 F (36.7 C), temperature source Oral, resp. rate 16, height $RemoveBe'5\' 10"'pOEuNdiiu$  (1.778 m), weight 240 lb 3.2 oz (109 kg), SpO2 100 %.    HEENT: No thrush or ulcers. Resp: Lungs clear bilaterally. Cardio: Regular rate and rhythm. GI: No hepatosplenomegaly. Vascular: No leg edema. Skin: Palms without erythema. Port-A-Cath without erythema.   Lab Results:  Lab Results  Component Value Date   WBC 8.9 04/13/2021   HGB 13.1 04/13/2021   HCT 44.7 04/13/2021   MCV 83.6 04/13/2021   PLT 183 04/13/2021   NEUTROABS 5.5 04/13/2021    Imaging:  No results found.  Medications: I have reviewed the patient's current medications.  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, biopsy confirmed adenocarcinoma         CTs chest, abdomen, and pelvis on 12/11/2015-indeterminate tiny pulmonary nodules, multiple liver metastases, small nodes in the sigmoid mesocolon Laparoscopic sigmoid colectomy, liver biopsy, Port-A-Cath placement 01/14/2016 Pathology sigmoid colon resection- colonic adenocarcinoma, 5.3 cm extending into pericolonic connective tissue, positive lymph-vascular invasion, positive perineural invasion, negative margins, metastatic carcinoma in 9 of 28 lymph nodes Pathology liver biopsy-metastatic colorectal adenocarcinoma MSI stable; mismatch repair protein normal APC  alteration, K RAS wild-type, no BRAF mutation Cycle 1 FOLFOX 02/02/2016 Cycle 2 FOLFOX 02/15/2016 Cycle 3 FOLFOX 02/29/2016 Cycle 4 FOLFOX 03/14/2016 Cycle 5 FOLFOX 03/28/2016 Cycle 6 FOLFOX 04/11/2016 (oxaliplatin held secondary to thrombocytopenia) 04/21/2016 restaging CTs-difficulty evaluating liver lesions due to hepatic steatosis. Stable right adrenal nodule. No adenopathy or local recurrence near the rectosigmoid anastomotic site. Cycle 7 FOLFOX 04/25/2016 MRI liver 05/02/2016-partial improvement in hepatic metastases Cycle 8 FOLFOX 05/10/2016 Cycle 9 FOLFOX 05/23/2016 (oxaliplatin held due to thrombocytopenia) Cycle 10 FOLFOX 06/06/2016 (oxaliplatin dose reduced due to thrombocytopenia) Cycle 11 FOLFOX 06/27/2016 (oxaliplatin held due to neuropathy) Cycle 12 FOLFOX 07/11/2016 (oxaliplatin held) Initiation of maintenance Xeloda 7 days on/7 days off 07/27/2016 MRI liver 11/18/2016-decrease in hepatic metastatic disease. No new or progressive disease identified within the abdomen. Continuation of Xeloda 7 days on/7 days off MRI liver 04/27/2017-previous liver lesions not identified, no new lesions, no lymphadenopathy Xeloda continued 7 days on/7 days off MRI liver 12/04/2017 - no evidence of metastatic disease, hepatic steatosis Xeloda continued 7 days on/7 days off MRI liver 07/15/2018- no evidence of metastatic disease.  Stable severe hepatic steatosis. Xeloda continued 7 days on/7 days off MRI liver 03/16/2019-hepatic steatosis, no liver mass, focal area of intrahepatic biliary dilatation in segments 2 and 3 of the left lobe-increased Xeloda continued 7 days on/7 days off MRI abdomen 08/19/2019-no findings to suggest liver metastases.  Bilateral lung nodules measuring up to 2.8 cm, progressive and more conspicuous than on previous exam CT chest 08/29/2019-multiple pulmonary metastases, new from 04/21/2016 Cycle 1 FOLFIRI/bevacizumab 09/09/2019 Cycle 2 FOLFIRI/bevacizumab 09/26/2019  Cycle  3 FOLFIRI/bevacizumab 10/10/2019 Cycle 4 FOLFIRI/bevacizumab 10/24/2019 Cycle 5 FOLFIRI/bevacizumab 11/07/2019 CT chest 11/14/2019-decreased size of lung nodules, no new lesions, hepatic  steatosis Cycle 6 FOLFIRI/bevacizumab 11/21/2019 Cycle 7 FOLFIRI/bevacizumab 12/05/2019 Cycle 8 FOLFIRI/bevacizumab 12/19/2019 Cycle 9 FOLFIRI/bevacizumab 01/02/2020 Cycle 10 FOLFIRI/bevacizumab 01/16/2020 CT chest 01/29/2020-stable bilateral pulmonary metastases.  No new or progressive metastatic disease in the chest. Cycle 11 FOLFIRI/bevacizumab 01/30/2020 Cycle 12 FOLFIRI/bevacizumab 02/19/2020 Cycle 13 FOLFIRI/bevacizumab 03/12/2020 Cycle 14 FOLFIRI/bevacizumab 04/02/2020 Cycle 15 FOLFIRI/bevacizumab 04/23/2020 Cycle 16 FOLFIRI/bevacizumab 05/21/2020 CT chest 06/09/2020-mild progression pulmonary metastasis.  Some lesions have increased in size while others are similar. Cycle 1 irinotecan/Panitumumab 06/18/2020 Cycle 2 irinotecan/Panitumumab 07/02/2020 Cycle 3 irinotecan/panitumumab 07/16/2020 Cycle 4 irinotecan/Panitumumab 07/30/2020 Cycle 5 irinotecan/Panitumumab 08/13/2020, Emend added Cycle 6 irinotecan/Panitumumab 08/27/2020 CT chest 09/08/2020-decreased size of pulmonary nodules, no progressive disease Cycle 7 irinotecan/panitumumab 09/02/2020 Cycle 8 irinotecan/panitumumab 09/24/2020 Cycle 9 irinotecan/Panitumumab 10/08/2020 10/22/2020 treatment held due to left foot fracture, need for surgery Cycle 10 irinotecan/panitumumab 11/24/2020 Cycle 11 irinotecan/Panitumumab 12/08/2020 Cycle 12 irinotecan/panitumumab 12/22/2020 Cycle 13 irinotecan/panitumumab 01/05/2021 01/20/2021 CT chest-mixed response with minimal increase in size of some lesions and minimal decrease in the size of other lesions.  Overall number of lesions is unchanged. Cycle 14 irinotecan/Panitumumab 02/05/2021 Cycle 15 irinotecan/Panitumumab 02/23/2021 Cycle 16 irinotecan/panitumumab 03/16/2021 Cycle 17 irinotecan/Panitumumab 04/13/2021   2.   Rectal bleeding and  constipation secondary to #1   3.   History of peripheral vascular disease, status post left lower extremity vascular bypass surgery in April 2017   4.   History of nephrolithiasis   5.   History of Graves' disease treated with radioactive iodine   6.   Anxiety/depression   7.   Hypertension   8.   Hospitalization 01/19/2016 with wound dehiscence status post secondary suture closure of abdominal wall   9.   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 Hyperglycemia 06/20/2016-diagnosed with diabetes, maintained on insulin   11.  Positive COVID test 12/13/2018; positive COVID test 01/25/2021   12. Hospitalized with seizure activity/DKA. Now on Keppra, insulin adjusted. 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  Left foot fracture-surgical repair 11/11/2020  Disposition: Jeff [name] appears unchanged.  He is on active treatment with irinotecan/Panitumumab.  Plan to proceed with treatment today as scheduled.  Restaging CT prior to next office visit.  He developed significant diarrhea after the most recent cycle.  I am sending a prescription to his pharmacy for Lomotil.  He understands to contact the office with poorly controlled diarrhea.  CBC and chemistry panel reviewed.  Labs adequate to proceed as above.  He has persistent hypomagnesemia.  He will increase oral magnesium to 3 times daily.  Plan for 2 g of IV magnesium today.  He will return for lab, follow-up, irinotecan/Panitumumab in 3 weeks.  He will contact the office in the interim as outlined above or with any other problems.  Jeff [name] ANP/GNP-BC   04/13/2021  11:26 AM

## 2021-04-13 NOTE — Patient Instructions (Signed)
[facility]   Discharge Instructions: Thank you for choosing [facility] to provide your oncology and hematology care.   If you have a lab appointment with the [facility], please go directly to the [facility] and check in at the registration area.   Wear comfortable clothing and clothing appropriate for easy access to any Portacath or PICC line.   We strive to give you quality time with your provider. You may need to reschedule your appointment if you arrive late (15 or more minutes).  Arriving late affects you and other patients whose appointments are after yours.  Also, if you miss three or more appointments without notifying the office, you may be dismissed from the clinic at the provider's discretion.      For prescription refill requests, have your pharmacy contact our office and allow 72 hours for refills to be completed.    Today you received the following chemotherapy and/or immunotherapy agents Panitumumab (VECTIBIX) & Irinotecan (CAMPTOSAR).      To help prevent nausea and vomiting after your treatment, we encourage you to take your nausea medication as directed.  BELOW ARE SYMPTOMS THAT SHOULD BE REPORTED IMMEDIATELY: *FEVER GREATER THAN 100.4 F (38 C) OR HIGHER *CHILLS OR SWEATING *NAUSEA AND VOMITING THAT IS NOT CONTROLLED WITH YOUR NAUSEA MEDICATION *UNUSUAL SHORTNESS OF BREATH *UNUSUAL BRUISING OR BLEEDING *URINARY PROBLEMS (pain or burning when urinating, or frequent urination) *BOWEL PROBLEMS (unusual diarrhea, constipation, pain near the anus) TENDERNESS IN MOUTH AND THROAT WITH OR WITHOUT PRESENCE OF ULCERS (sore throat, sores in mouth, or a toothache) UNUSUAL RASH, SWELLING OR PAIN  UNUSUAL VAGINAL DISCHARGE OR ITCHING   Items with * indicate a potential emergency and should be followed up as soon as possible or go to the Emergency Department if any problems should occur.  Please show the CHEMOTHERAPY ALERT CARD or  IMMUNOTHERAPY ALERT CARD at check-in to the Emergency Department and triage nurse.  Should you have questions after your visit or need to cancel or reschedule your appointment, please contact [facility]  Dept: [phone]  and follow the prompts.  Office hours are 8:00 a.m. to 4:30 p.m. Monday - Friday. Please note that voicemails left after 4:00 p.m. may not be returned until the following business day.  We are closed weekends and major holidays. You have access to a nurse at all times for urgent questions. Please call the main number to the clinic Dept: [phone] and follow the prompts.   For any non-urgent questions, you may also contact your provider using MyChart. We now offer e-Visits for anyone 55 and older to request care online for non-urgent symptoms. For details visit [email or web address].   Also download the MyChart app! Go to the app store, search "MyChart", open the app, select [facility], and log in with your MyChart username and password.  Due to Covid, a mask is required upon entering the hospital/clinic. If you do not have a mask, one will be given to you upon arrival. For doctor visits, patients may have 1 support person aged 61 or older with them. For treatment visits, patients cannot have anyone with them due to current Covid guidelines and our immunocompromised population.   Panitumumab Solution for Injection What is this medication? PANITUMUMAB (pan i TOOM ue mab) is a monoclonal antibody. It is used to treat colorectal cancer. This medicine may be used for other purposes; ask your health care provider or pharmacist if you have questions.  COMMON BRAND NAME(S): Vectibix What should I tell my care team before I take this medication? They need to know if you have any of these conditions: eye disease, vision problems low levels of calcium, magnesium, or potassium in the blood lung or breathing disease, like asthma skin conditions or  sensitivity an unusual or allergic reaction to panitumumab, other medicines, foods, dyes, or preservatives pregnant or trying to get pregnant breast-feeding How should I use this medication? This drug is given as an infusion into a vein. It is administered in a hospital or clinic by a specially trained health care professional. Talk to your pediatrician regarding the use of this medicine in children. Special care may be needed. Overdosage: If you think you have taken too much of this medicine contact a poison control center or emergency room at once. NOTE: This medicine is only for you. Do not share this medicine with others. What if I miss a dose? It is important not to miss your dose. Call your doctor or health care professional if you are unable to keep an appointment. What may interact with this medication? Do not take this medicine with any of the following medications: bevacizumab This list may not describe all possible interactions. Give your health care provider a list of all the medicines, herbs, non-prescription drugs, or dietary supplements you use. Also tell them if you smoke, drink alcohol, or use illegal drugs. Some items may interact with your medicine. What should I watch for while using this medication? Visit your doctor for checks on your progress. This drug may make you feel generally unwell. This is not uncommon, as chemotherapy can affect healthy cells as well as cancer cells. Report any side effects. Continue your course of treatment even though you feel ill unless your doctor tells you to stop. This medicine can make you more sensitive to the sun. Keep out of the sun while receiving this medicine and for 2 months after the last dose. If you cannot avoid being in the sun, wear protective clothing and use sunscreen. Do not use sun lamps or tanning beds/booths. In some cases, you may be given additional medicines to help with side effects. Follow all directions for their  use. Call your doctor or health care professional for advice if you get a fever, chills or sore throat, or other symptoms of a cold or flu. Do not treat yourself. This drug decreases your body's ability to fight infections. Try to avoid being around people who are sick. Avoid taking products that contain aspirin, acetaminophen, ibuprofen, naproxen, or ketoprofen unless instructed by your doctor. These medicines may hide a fever. Do not become pregnant while taking this medicine and for 2 months after the last dose. Women should inform their doctor if they wish to become pregnant or think they might be pregnant. There is a potential for serious side effects to an unborn child. Talk to your health care professional or pharmacist for more information. Do not breast-feed an infant while taking this medicine or for 2 months after the last dose. What side effects may I notice from receiving this medication? Side effects that you should report to your doctor or health care professional as soon as possible: allergic reactions like skin rash, itching or hives, swelling of the face, lips, or tongue breathing problems changes in vision eye pain fast, irregular heartbeat fever, chills mouth sores red spots on the skin redness, blistering, peeling or loosening of the skin, including inside the mouth signs and symptoms of  kidney injury like trouble passing urine or change in the amount of urine signs and symptoms of low blood pressure like dizziness; feeling faint or lightheaded, falls; unusually weak or tired signs of low calcium like fast heartbeat, muscle cramps or muscle pain; pain, tingling, numbness in the hands or feet; seizures signs and symptoms of low magnesium like muscle cramps, pain, or weakness; tremors; seizures; or fast, irregular heartbeat signs and symptoms of low potassium like muscle cramps or muscle pain; chest pain; dizziness; feeling faint or lightheaded, falls; palpitations; breathing  problems; or fast, irregular heartbeat swelling of the ankles, feet, hands Side effects that usually do not require medical attention (report to your doctor or health care professional if they continue or are bothersome): changes in skin like acne, cracks, skin dryness diarrhea eyelash growth headache mouth sores nail changes nausea, vomiting This list may not describe all possible side effects. Call your doctor for medical advice about side effects. You may report side effects to FDA at 1-800-FDA-1088. Where should I keep my medication? This drug is given in a hospital or clinic and will not be stored at home. NOTE: This sheet is a summary. It may not cover all possible information. If you have questions about this medicine, talk to your doctor, pharmacist, or health care provider.  2022 Elsevier/Gold Standard (2015-11-27 00:00:00)  Irinotecan injection What is this medication? IRINOTECAN (ir in oh TEE kan ) is a chemotherapy drug. It is used to treat colon and rectal cancer. This medicine may be used for other purposes; ask your health care provider or pharmacist if you have questions. COMMON BRAND NAME(S): Camptosar What should I tell my care team before I take this medication? They need to know if you have any of these conditions: dehydration diarrhea infection (especially a virus infection such as chickenpox, cold sores, or herpes) liver disease low blood counts, like low white cell, platelet, or red cell counts low levels of calcium, magnesium, or potassium in the blood recent or ongoing radiation therapy an unusual or allergic reaction to irinotecan, other medicines, foods, dyes, or preservatives pregnant or trying to get pregnant breast-feeding How should I use this medication? This drug is given as an infusion into a vein. It is administered in a hospital or clinic by a specially trained health care professional. Talk to your pediatrician regarding the use of this  medicine in children. Special care may be needed. Overdosage: If you think you have taken too much of this medicine contact a poison control center or emergency room at once. NOTE: This medicine is only for you. Do not share this medicine with others. What if I miss a dose? It is important not to miss your dose. Call your doctor or health care professional if you are unable to keep an appointment. What may interact with this medication? Do not take this medicine with any of the following medications: cobicistat itraconazole This medicine may interact with the following medications: antiviral medicines for HIV or AIDS certain antibiotics like rifampin or rifabutin certain medicines for fungal infections like ketoconazole, posaconazole, and voriconazole certain medicines for seizures like carbamazepine, phenobarbital, phenotoin clarithromycin gemfibrozil nefazodone [facility] John's Wort This list may not describe all possible interactions. Give your health care provider a list of all the medicines, herbs, non-prescription drugs, or dietary supplements you use. Also tell them if you smoke, drink alcohol, or use illegal drugs. Some items may interact with your medicine. What should I watch for while using this medication? Your condition will  be monitored carefully while you are receiving this medicine. You will need important blood work done while you are taking this medicine. This drug may make you feel generally unwell. This is not uncommon, as chemotherapy can affect healthy cells as well as cancer cells. Report any side effects. Continue your course of treatment even though you feel ill unless your doctor tells you to stop. In some cases, you may be given additional medicines to help with side effects. Follow all directions for their use. You may get drowsy or dizzy. Do not drive, use machinery, or do anything that needs mental alertness until you know how this medicine affects you. Do not stand or  sit up quickly, especially if you are an older patient. This reduces the risk of dizzy or fainting spells. Call your health care professional for advice if you get a fever, chills, or sore throat, or other symptoms of a cold or flu. Do not treat yourself. This medicine decreases your body's ability to fight infections. Try to avoid being around people who are sick. Avoid taking products that contain aspirin, acetaminophen, ibuprofen, naproxen, or ketoprofen unless instructed by your doctor. These medicines may hide a fever. This medicine may increase your risk to bruise or bleed. Call your doctor or health care professional if you notice any unusual bleeding. Be careful brushing and flossing your teeth or using a toothpick because you may get an infection or bleed more easily. If you have any dental work done, tell your dentist you are receiving this medicine. Do not become pregnant while taking this medicine or for 6 months after stopping it. Women should inform their health care professional if they wish to become pregnant or think they might be pregnant. Men should not father a child while taking this medicine and for 3 months after stopping it. There is potential for serious side effects to an unborn child. Talk to your health care professional for more information. Do not breast-feed an infant while taking this medicine or for 7 days after stopping it. This medicine has caused ovarian failure in some women. This medicine may make it more difficult to get pregnant. Talk to your health care professional if you are concerned about your fertility. This medicine has caused decreased sperm counts in some men. This may make it more difficult to father a child. Talk to your health care professional if you are concerned about your fertility. What side effects may I notice from receiving this medication? Side effects that you should report to your doctor or health care professional as soon as  possible: allergic reactions like skin rash, itching or hives, swelling of the face, lips, or tongue chest pain diarrhea flushing, runny nose, sweating during infusion low blood counts - this medicine may decrease the number of white blood cells, red blood cells and platelets. You may be at increased risk for infections and bleeding. nausea, vomiting pain, swelling, warmth in the leg signs of decreased platelets or bleeding - bruising, pinpoint red spots on the skin, black, tarry stools, blood in the urine signs of infection - fever or chills, cough, sore throat, pain or difficulty passing urine signs of decreased red blood cells - unusually weak or tired, fainting spells, lightheadedness Side effects that usually do not require medical attention (report to your doctor or health care professional if they continue or are bothersome): constipation hair loss headache loss of appetite mouth sores stomach pain This list may not describe all possible side effects. Call your doctor  for medical advice about side effects. You may report side effects to FDA at 1-800-FDA-1088. Where should I keep my medication? This drug is given in a hospital or clinic and will not be stored at home. NOTE: This sheet is a summary. It may not cover all possible information. If you have questions about this medicine, talk to your doctor, pharmacist, or health care provider.  2022 Elsevier/Gold Standard (2021-01-19 00:00:00)  Magnesium Sulfate Injection What is this medication? MAGNESIUM SULFATE (mag NEE zee um SUL fate) prevents and treats low levels of magnesium in your body. It may also be used to prevent and treat seizures during pregnancy in people with high blood pressure disorders, such as preeclampsia or eclampsia. Magnesium plays an important role in maintaining the health of your muscles and nervous system. This medicine may be used for other purposes; ask your health care provider or pharmacist if you have  questions. What should I tell my care team before I take this medication? They need to know if you have any of these conditions: Heart disease History of irregular heart beat Kidney disease An unusual or allergic reaction to magnesium sulfate, medications, foods, dyes, or preservatives Pregnant or trying to get pregnant Breast-feeding How should I use this medication? This medication is for infusion into a vein. It is given in a hospital or clinic setting. Talk to your care team about the use of this medication in children. While this medication may be prescribed for selected conditions, precautions do apply. Overdosage: If you think you have taken too much of this medicine contact a poison control center or emergency room at once. NOTE: This medicine is only for you. Do not share this medicine with others. What if I miss a dose? This does not apply. What may interact with this medication? Certain medications for anxiety or sleep Certain medications for seizures like phenobarbital Digoxin Medications that relax muscles for surgery Narcotic medications for pain This list may not describe all possible interactions. Give your health care provider a list of all the medicines, herbs, non-prescription drugs, or dietary supplements you use. Also tell them if you smoke, drink alcohol, or use illegal drugs. Some items may interact with your medicine. What should I watch for while using this medication? Your condition will be monitored carefully while you are receiving this medication. You may need blood work done while you are receiving this medication. What side effects may I notice from receiving this medication? Side effects that you should report to your care team as soon as possible: Allergic reactions--skin rash, itching, hives, swelling of the face, lips, tongue, or throat High magnesium level--confusion, drowsiness, facial flushing, redness, sweating, muscle weakness, fast or irregular  heartbeat, trouble breathing Low blood pressure--dizziness, feeling faint or lightheaded, blurry vision Side effects that usually do not require medical attention (report to your care team if they continue or are bothersome): Headache Nausea This list may not describe all possible side effects. Call your doctor for medical advice about side effects. You may report side effects to FDA at 1-800-FDA-1088. Where should I keep my medication? This medication is given in a hospital or clinic and will not be stored at home. NOTE: This sheet is a summary. It may not cover all possible information. If you have questions about this medicine, talk to your doctor, pharmacist, or health care provider.  2022 Elsevier/Gold Standard (2020-07-16 00:00:00)

## 2021-04-13 NOTE — Progress Notes (Signed)
Patient presents for treatment. RN assessment completed along with the following:  Labs/vitals reviewed - Yes, and within treatment parameters.   Weight within 10% of previous measurement - Yes Oncology Treatment Attestation completed for current therapy- Yes, on date 06/12/2020 Informed consent completed and reflects current therapy/intent - Yes, on date 11/15/2020             Provider progress note reviewed - Yes, today's provider note was reviewed. Treatment/Antibody/Supportive plan reviewed - Yes, and Patient getting 2 g of Magnesium today. S&H and other orders reviewed - Yes, and there are no additional orders identified. Previous treatment date reviewed - Yes, and the appropriate amount of time has elapsed between treatments. Clinic Hand Off Received from - Yes from Lattie Haw, NP  Patient to proceed with treatment.

## 2021-04-13 NOTE — Patient Instructions (Signed)

## 2021-04-15 ENCOUNTER — Telehealth: Payer: Self-pay | Admitting: *Deleted

## 2021-04-15 NOTE — Telephone Encounter (Signed)
Scheduled CT for 12/16 at 1045/1100 at [facility]. No prep needed. Left appointment on VM and Mychart w/request to call and confirm. Provided central scheduling phone # if they need to reschedule.

## 2021-04-22 ENCOUNTER — Encounter: Payer: Self-pay | Admitting: *Deleted

## 2021-04-22 NOTE — Progress Notes (Signed)
Faxed completed FMLA form for Jeff [name] to [employer] at [phone]. Copy to HIM to scan and will provide copy to patient at next visit.

## 2021-04-28 ENCOUNTER — Other Ambulatory Visit: Payer: Self-pay | Admitting: Cardiovascular Disease

## 2021-04-28 DIAGNOSIS — I10 Essential (primary) hypertension: Secondary | ICD-10-CM

## 2021-04-30 ENCOUNTER — Other Ambulatory Visit: Payer: Self-pay

## 2021-04-30 ENCOUNTER — Ambulatory Visit (HOSPITAL_BASED_OUTPATIENT_CLINIC_OR_DEPARTMENT_OTHER)
Admission: RE | Admit: 2021-04-30 | Discharge: 2021-04-30 | Disposition: A | Discharge status: Home / Self Care | Payer: Medicaid Other | Source: Ambulatory Visit | Attending: Nurse Practitioner | Admitting: Nurse Practitioner

## 2021-04-30 DIAGNOSIS — C189 Malignant neoplasm of colon, unspecified: Secondary | ICD-10-CM

## 2021-05-02 ENCOUNTER — Other Ambulatory Visit: Payer: Self-pay | Admitting: Oncology

## 2021-05-03 ENCOUNTER — Other Ambulatory Visit: Payer: Self-pay

## 2021-05-03 ENCOUNTER — Ambulatory Visit (HOSPITAL_BASED_OUTPATIENT_CLINIC_OR_DEPARTMENT_OTHER)
Admission: RE | Admit: 2021-05-03 | Discharge: 2021-05-03 | Disposition: A | Discharge status: Home / Self Care | Payer: Medicaid Other | Source: Ambulatory Visit | Attending: Nurse Practitioner | Admitting: Nurse Practitioner

## 2021-05-03 DIAGNOSIS — C787 Secondary malignant neoplasm of liver and intrahepatic bile duct: Secondary | ICD-10-CM | POA: Diagnosis present

## 2021-05-03 DIAGNOSIS — C189 Malignant neoplasm of colon, unspecified: Secondary | ICD-10-CM | POA: Insufficient documentation

## 2021-05-04 ENCOUNTER — Inpatient Hospital Stay: Payer: Medicaid Other

## 2021-05-04 ENCOUNTER — Telehealth: Payer: Self-pay

## 2021-05-04 ENCOUNTER — Encounter: Payer: Self-pay | Admitting: *Deleted

## 2021-05-04 ENCOUNTER — Other Ambulatory Visit: Payer: Self-pay | Admitting: Oncology

## 2021-05-04 ENCOUNTER — Inpatient Hospital Stay: Payer: Medicaid Other | Attending: Oncology

## 2021-05-04 ENCOUNTER — Inpatient Hospital Stay (HOSPITAL_BASED_OUTPATIENT_CLINIC_OR_DEPARTMENT_OTHER): Payer: Medicaid Other | Admitting: Oncology

## 2021-05-04 VITALS — BP 130/84 | HR 94 | Temp 98.0°F | Resp 18 | Ht 70.0 in | Wt 239.0 lb

## 2021-05-04 VITALS — BP 139/69 | HR 100 | Temp 97.8°F | Resp 20

## 2021-05-04 DIAGNOSIS — C187 Malignant neoplasm of sigmoid colon: Secondary | ICD-10-CM | POA: Diagnosis present

## 2021-05-04 DIAGNOSIS — C787 Secondary malignant neoplasm of liver and intrahepatic bile duct: Secondary | ICD-10-CM

## 2021-05-04 DIAGNOSIS — Z95828 Presence of other vascular implants and grafts: Secondary | ICD-10-CM

## 2021-05-04 DIAGNOSIS — C189 Malignant neoplasm of colon, unspecified: Secondary | ICD-10-CM | POA: Diagnosis not present

## 2021-05-04 LAB — CMP (CANCER CENTER ONLY)
ALT: 36 U/L (ref 0–44)
AST: 37 U/L (ref 15–41)
Albumin: 4.1 g/dL (ref 3.5–5.0)
Alkaline Phosphatase: 114 U/L (ref 38–126)
Anion gap: 13 (ref 5–15)
BUN: <5 mg/dL — ABNORMAL LOW (ref 6–20)
CO2: 23 mmol/L (ref 22–32)
Calcium: 8.7 mg/dL — ABNORMAL LOW (ref 8.9–10.3)
Chloride: 101 mmol/L (ref 98–111)
Creatinine: 0.97 mg/dL (ref 0.61–1.24)
GFR, Estimated: >60 mL/min (ref >60)
Glucose, Bld: 303 mg/dL — ABNORMAL HIGH (ref 70–99)
Potassium: 3.6 mmol/L (ref 3.5–5.1)
Sodium: 137 mmol/L (ref 135–145)
Total Bilirubin: 0.5 mg/dL (ref 0.3–1.2)
Total Protein: 7.2 g/dL (ref 6.5–8.1)

## 2021-05-04 LAB — CBC WITH DIFFERENTIAL (CANCER CENTER ONLY)
Abs Immature Granulocytes: 0.04 10*3/uL (ref 0.00–0.07)
Basophils Absolute: 0 10*3/uL (ref 0.0–0.1)
Basophils Relative: 0 %
Eosinophils Absolute: 0.3 10*3/uL (ref 0.0–0.5)
Eosinophils Relative: 4 %
HCT: 43.5 % (ref 39.0–52.0)
Hemoglobin: 12.9 g/dL — ABNORMAL LOW (ref 13.0–17.0)
Immature Granulocytes: 1 %
Lymphocytes Relative: 39 %
Lymphs Abs: 3.1 10*3/uL (ref 0.7–4.0)
MCH: 24.5 pg — ABNORMAL LOW (ref 26.0–34.0)
MCHC: 29.7 g/dL — ABNORMAL LOW (ref 30.0–36.0)
MCV: 82.7 fL (ref 80.0–100.0)
Monocytes Absolute: 0.8 10*3/uL (ref 0.1–1.0)
Monocytes Relative: 10 %
Neutro Abs: 3.7 10*3/uL (ref 1.7–7.7)
Neutrophils Relative %: 46 %
Platelet Count: 220 10*3/uL (ref 150–400)
RBC: 5.26 MIL/uL (ref 4.22–5.81)
RDW: 16.3 % — ABNORMAL HIGH (ref 11.5–15.5)
WBC Count: 8 10*3/uL (ref 4.0–10.5)
nRBC: 0 % (ref 0.0–0.2)

## 2021-05-04 LAB — MAGNESIUM: Magnesium: 1.5 mg/dL — ABNORMAL LOW (ref 1.7–2.4)

## 2021-05-04 LAB — CEA (ACCESS): CEA (CHCC): 9.11 ng/mL — ABNORMAL HIGH (ref 0.00–5.00)

## 2021-05-04 MED ORDER — SODIUM CHLORIDE 0.9 % IV SOLN: INTRAVENOUS | Status: DC

## 2021-05-04 MED ORDER — SODIUM CHLORIDE 0.9% FLUSH
10.0000 mL | INTRAVENOUS | Status: DC | PRN
  Administered 2021-05-04: 12:00:00 10 mL via INTRAVENOUS

## 2021-05-04 MED ORDER — HEPARIN SOD (PORK) LOCK FLUSH 100 UNIT/ML IV SOLN
500.0000 [IU] | Freq: Once | INTRAVENOUS | Status: AC | PRN
  Administered 2021-05-04: 12:00:00 500 [IU] via INTRAVENOUS

## 2021-05-04 MED ORDER — MAGNESIUM SULFATE 2 GM/50ML IV SOLN
2.0000 g | Freq: Once | INTRAVENOUS | Status: AC
  Administered 2021-05-04: 11:00:00 2 g via INTRAVENOUS
  Filled 2021-05-04: qty 50

## 2021-05-04 NOTE — Progress Notes (Signed)
Spoke with Carolanne Grumbling, [facility] Navigator. Referral made to Dr Clelia Croft per Dr Benay Spice.

## 2021-05-04 NOTE — Patient Instructions (Signed)
Magnesium Sulfate Injection What is this medication? MAGNESIUM SULFATE (mag NEE zee um SUL fate) prevents and treats low levels of magnesium in your body. It may also be used to prevent and treat seizures during pregnancy in people with high blood pressure disorders, such as preeclampsia or eclampsia. Magnesium plays an important role in maintaining the health of your muscles and nervous system. This medicine may be used for other purposes; ask your health care provider or pharmacist if you have questions. What should I tell my care team before I take this medication? They need to know if you have any of these conditions: Heart disease History of irregular heart beat Kidney disease An unusual or allergic reaction to magnesium sulfate, medications, foods, dyes, or preservatives Pregnant or trying to get pregnant Breast-feeding How should I use this medication? This medication is for infusion into a vein. It is given in a hospital or clinic setting. Talk to your care team about the use of this medication in children. While this medication may be prescribed for selected conditions, precautions do apply. Overdosage: If you think you have taken too much of this medicine contact a poison control center or emergency room at once. NOTE: This medicine is only for you. Do not share this medicine with others. What if I miss a dose? This does not apply. What may interact with this medication? Certain medications for anxiety or sleep Certain medications for seizures like phenobarbital Digoxin Medications that relax muscles for surgery Narcotic medications for pain This list may not describe all possible interactions. Give your health care provider a list of all the medicines, herbs, non-prescription drugs, or dietary supplements you use. Also tell them if you smoke, drink alcohol, or use illegal drugs. Some items may interact with your medicine. What should I watch for while using this medication? Your  condition will be monitored carefully while you are receiving this medication. You may need blood work done while you are receiving this medication. What side effects may I notice from receiving this medication? Side effects that you should report to your care team as soon as possible: Allergic reactions--skin rash, itching, hives, swelling of the face, lips, tongue, or throat High magnesium level--confusion, drowsiness, facial flushing, redness, sweating, muscle weakness, fast or irregular heartbeat, trouble breathing Low blood pressure--dizziness, feeling faint or lightheaded, blurry vision Side effects that usually do not require medical attention (report to your care team if they continue or are bothersome): Headache Nausea This list may not describe all possible side effects. Call your doctor for medical advice about side effects. You may report side effects to FDA at 1-800-FDA-1088. Where should I keep my medication? This medication is given in a hospital or clinic and will not be stored at home. NOTE: This sheet is a summary. It may not cover all possible information. If you have questions about this medicine, talk to your doctor, pharmacist, or health care provider.  2022 Elsevier/Gold Standard (2020-07-16 00:00:00)  

## 2021-05-04 NOTE — Progress Notes (Signed)
Jeff [name] OFFICE PROGRESS NOTE   Diagnosis: Colon cancer  INTERVAL HISTORY:   Jeff [name] returns as scheduled.  He completed another treatment with irinotecan/panitumumab 04/13/2021.  Mild nausea following the cycle of chemotherapy.  No significant diarrhea.  Persistent skin rash.  He feels well.  He has an intermittent cough.   Objective:  Vital signs in last 24 hours:  Blood pressure 130/84, pulse 94, temperature 98 F (36.7 C), temperature source Oral, resp. rate 18, height $RemoveBe'5\' 10"'HhcwWqYNL$  (1.778 m), weight 239 lb (108.4 kg), SpO2 100 %.    HEENT: No thrush or ulcers Resp: Lungs clear bilaterally Cardio: Regular rate and rhythm GI: No hepatosplenomegaly, nontender Vascular: No leg edema  Skin: Acne type rash over the face and trunk  Portacath/PICC-without erythema  Lab Results:  Lab Results  Component Value Date   WBC 8.0 05/04/2021   HGB 12.9 (L) 05/04/2021   HCT 43.5 05/04/2021   MCV 82.7 05/04/2021   PLT 220 05/04/2021   NEUTROABS 3.7 05/04/2021    CMP  Lab Results  Component Value Date   NA 137 05/04/2021   K 3.6 05/04/2021   CL 101 05/04/2021   CO2 23 05/04/2021   GLUCOSE 303 (H) 05/04/2021   BUN <5 (L) 05/04/2021   CREATININE 0.97 05/04/2021   CALCIUM 8.7 (L) 05/04/2021   PROT 7.2 05/04/2021   ALBUMIN 4.1 05/04/2021   AST 37 05/04/2021   ALT 36 05/04/2021   ALKPHOS 114 05/04/2021   BILITOT 0.5 05/04/2021   GFRNONAA >60 05/04/2021   GFRAA >60 01/30/2020    Lab Results  Component Value Date   CEA1 3.19 09/10/2020   CEA 9.11 (H) 05/04/2021    Lab Results  Component Value Date   INR 0.98 01/19/2016   LABPROT 13.0 01/19/2016    Imaging:  CT Chest Wo Contrast  Result Date: 05/03/2021 CLINICAL DATA:  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 Ongoing chemotherapy. EXAM: CT CHEST WITHOUT CONTRAST TECHNIQUE: Multidetector CT imaging of the chest was performed following the standard protocol without IV contrast.  COMPARISON:  01/20/2021 chest CT. FINDINGS: Cardiovascular: Normal heart size. Trace pericardial effusion/thickening, stable. Right internal jugular Port-A-Cath terminates in the upper third of the SVC. Mildly atherosclerotic thoracic aorta with stable dilated 4.1 cm ascending thoracic aorta. Stable top-normal caliber main pulmonary artery (3.2 cm diameter). Mediastinum/Nodes: No discrete thyroid nodules. Unremarkable esophagus. No pathologically enlarged axillary, mediastinal or hilar lymph nodes, noting limited sensitivity for the detection of hilar adenopathy on this noncontrast study. Lungs/Pleura: No pneumothorax. No pleural effusion. No acute consolidative airspace disease. There are at least 7 solid lobulated pulmonary nodules/masses scattered throughout both lungs, all increased since 01/20/2021 chest CT. Representative 3.1 x 2.4 cm right middle lobe mass (series 4/image 81), previously 2.5 x 1.8 cm. Medial basilar right lower lobe 3.4 x 2.2 cm mass (series 4/image 114), increased from 2.6 x 1.8 cm. Posterior left upper lobe 3.8 x 2.1 cm mass along the major fissure (series 4/image 80), increased from 3.4 x 1.9 cm. Superior segment left lower lobe 2.7 x 2.0 cm nodule (series 4/image 71), increased from 2.2 x 1.7 cm. Posterior right lower lobe 1.4 x 0.9 cm nodule (series 4/image 91) is increased from 1.2 x 0.7 cm. No new significant pulmonary nodules. Upper abdomen: Diffuse hepatic steatosis.  Cholelithiasis. Musculoskeletal: No aggressive appearing focal osseous lesions. Mild thoracic spondylosis. IMPRESSION: 1. Interval progression of pulmonary metastases, as detailed. 2. No thoracic adenopathy. 3. Stable dilated 4.1 cm ascending  thoracic aorta. Recommend annual imaging followup by CTA or MRA. This recommendation follows 2010 ACCF/AHA/AATS/ACR/ASA/SCA/SCAI/SIR/STS/SVM Guidelines for the Diagnosis and Management of Patients with Thoracic Aortic Disease. Circulation. 2010; 121: G626-R485. Aortic aneurysm NOS  (ICD10-I71.9). 4. Diffuse hepatic steatosis. 5. Cholelithiasis. 6. Aortic Atherosclerosis (ICD10-I70.0). Electronically Signed   By: Ilona Sorrel M.D.   On: 05/03/2021 14:13    Medications: I have reviewed the patient's current medications.   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, biopsy confirmed adenocarcinoma         CTs chest, abdomen, and pelvis on 12/11/2015-indeterminate tiny pulmonary nodules, multiple liver metastases, small nodes in the sigmoid mesocolon Laparoscopic sigmoid colectomy, liver biopsy, Port-A-Cath placement 01/14/2016 Pathology sigmoid colon resection- colonic adenocarcinoma, 5.3 cm extending into pericolonic connective tissue, positive lymph-vascular invasion, positive perineural invasion, negative margins, metastatic carcinoma in 9 of 28 lymph nodes Pathology liver biopsy-metastatic colorectal adenocarcinoma MSI stable; mismatch repair protein normal APC alteration, K RAS wild-type, no BRAF mutation Cycle 1 FOLFOX 02/02/2016 Cycle 2 FOLFOX 02/15/2016 Cycle 3 FOLFOX 02/29/2016 Cycle 4 FOLFOX 03/14/2016 Cycle 5 FOLFOX 03/28/2016 Cycle 6 FOLFOX 04/11/2016 (oxaliplatin held secondary to thrombocytopenia) 04/21/2016 restaging CTs-difficulty evaluating liver lesions due to hepatic steatosis. Stable right adrenal nodule. No adenopathy or local recurrence near the rectosigmoid anastomotic site. Cycle 7 FOLFOX 04/25/2016 MRI liver 05/02/2016-partial improvement in hepatic metastases Cycle 8 FOLFOX 05/10/2016 Cycle 9 FOLFOX 05/23/2016 (oxaliplatin held due to thrombocytopenia) Cycle 10 FOLFOX 06/06/2016 (oxaliplatin dose reduced due to thrombocytopenia) Cycle 11 FOLFOX 06/27/2016 (oxaliplatin held due to neuropathy) Cycle 12 FOLFOX 07/11/2016 (oxaliplatin held) Initiation of maintenance Xeloda 7 days on/7 days off 07/27/2016 MRI liver 11/18/2016-decrease in hepatic metastatic disease. No new or  progressive disease identified within the abdomen. Continuation of Xeloda 7 days on/7 days off MRI liver 04/27/2017-previous liver lesions not identified, no new lesions, no lymphadenopathy Xeloda continued 7 days on/7 days off MRI liver 12/04/2017 - no evidence of metastatic disease, hepatic steatosis Xeloda continued 7 days on/7 days off MRI liver 07/15/2018- no evidence of metastatic disease.  Stable severe hepatic steatosis. Xeloda continued 7 days on/7 days off MRI liver 03/16/2019-hepatic steatosis, no liver mass, focal area of intrahepatic biliary dilatation in segments 2 and 3 of the left lobe-increased Xeloda continued 7 days on/7 days off MRI abdomen 08/19/2019-no findings to suggest liver metastases.  Bilateral lung nodules measuring up to 2.8 cm, progressive and more conspicuous than on previous exam CT chest 08/29/2019-multiple pulmonary metastases, new from 04/21/2016 Cycle 1 FOLFIRI/bevacizumab 09/09/2019 Cycle 2 FOLFIRI/bevacizumab 09/26/2019  Cycle 3 FOLFIRI/bevacizumab 10/10/2019 Cycle 4 FOLFIRI/bevacizumab 10/24/2019 Cycle 5 FOLFIRI/bevacizumab 11/07/2019 CT chest 11/14/2019-decreased size of lung nodules, no new lesions, hepatic steatosis Cycle 6 FOLFIRI/bevacizumab 11/21/2019 Cycle 7 FOLFIRI/bevacizumab 12/05/2019 Cycle 8 FOLFIRI/bevacizumab 12/19/2019 Cycle 9 FOLFIRI/bevacizumab 01/02/2020 Cycle 10 FOLFIRI/bevacizumab 01/16/2020 CT chest 01/29/2020-stable bilateral pulmonary metastases.  No new or progressive metastatic disease in the chest. Cycle 11 FOLFIRI/bevacizumab 01/30/2020 Cycle 12 FOLFIRI/bevacizumab 02/19/2020 Cycle 13 FOLFIRI/bevacizumab 03/12/2020 Cycle 14 FOLFIRI/bevacizumab 04/02/2020 Cycle 15 FOLFIRI/bevacizumab 04/23/2020 Cycle 16 FOLFIRI/bevacizumab 05/21/2020 CT chest 06/09/2020-mild progression pulmonary metastasis.  Some lesions have increased in size while others are similar. Cycle 1 irinotecan/Panitumumab 06/18/2020 Cycle 2 irinotecan/Panitumumab 07/02/2020 Cycle 3  irinotecan/panitumumab 07/16/2020 Cycle 4 irinotecan/Panitumumab 07/30/2020 Cycle 5 irinotecan/Panitumumab 08/13/2020, Emend added Cycle 6 irinotecan/Panitumumab 08/27/2020 CT chest 09/08/2020-decreased size of pulmonary nodules, no progressive disease Cycle 7 irinotecan/panitumumab 09/02/2020 Cycle 8 irinotecan/panitumumab 09/24/2020 Cycle 9 irinotecan/Panitumumab 10/08/2020 10/22/2020 treatment held due to left foot fracture, need for surgery Cycle  10 irinotecan/panitumumab 11/24/2020 Cycle 11 irinotecan/Panitumumab 12/08/2020 Cycle 12 irinotecan/panitumumab 12/22/2020 Cycle 13 irinotecan/panitumumab 01/05/2021 01/20/2021 CT chest-mixed response with minimal increase in size of some lesions and minimal decrease in the size of other lesions.  Overall number of lesions is unchanged. Cycle 14 irinotecan/Panitumumab 02/05/2021 Cycle 15 irinotecan/Panitumumab 02/23/2021 Cycle 16 irinotecan/panitumumab 03/16/2021 Cycle 17 irinotecan/Panitumumab 04/13/2021 05/03/2021-CT chest-enlargement of pulmonary metastases, no new lesions   2.   Rectal bleeding and constipation secondary to #1   3.   History of peripheral vascular disease, status post left lower extremity vascular bypass surgery in April 2017   4.   History of nephrolithiasis   5.   History of Graves' disease treated with radioactive iodine   6.   Anxiety/depression   7.   Hypertension   8.   Hospitalization 01/19/2016 with wound dehiscence status post secondary suture closure of abdominal wall   9.   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 Hyperglycemia 06/20/2016-diagnosed with diabetes, maintained on insulin   11.  Positive COVID test 12/13/2018; positive COVID test 01/25/2021   12. Hospitalized with seizure activity/DKA. Now on Keppra, insulin adjusted. Brain MRI 10/25/2019 with no seizure etiology identified, no acute abnormality; EEG 10/25/2019 with evidence of epileptogenicity arising  from right frontocentral region. 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  Left foot fracture-surgical repair 11/11/2020   Disposition: Jeff [name] has metastatic colon cancer.  He has been maintained on systemic therapy with irinotecan/panitumumab since February of this year.  The restaging chest CT yesterday is consistent with disease progression in the lungs.  I discussed the CT findings and reviewed the images with Jeff [name] and his wife.  Irinotecan/panitumumab will be discontinued.  He will take doxycycline for the next week and then this will be discontinued.  We discussed treatment options including standard treatment with recycled FOLFOX and Lonsurf/bevacizumab.  I will refer Jeff [name] to Dr. Reynaldo Minium for a second opinion and to consider his eligibility for clinical trial.  He will return for an office visit in 3 weeks.    Betsy Coder, MD  05/04/2021  10:24 AM

## 2021-05-04 NOTE — Telephone Encounter (Signed)
Patient seen by Dr. Benay Spice today  Vitals are within treatment parameters.  Labs reviewed by Dr. Benay Spice and are within treatment parameters. Mag 1.5 2 g of magnesium today.  Per physician team, patient will not be receiving treatment today.

## 2021-05-04 NOTE — Progress Notes (Deleted)
[facility] OFFICE PROGRESS NOTE   Diagnosis:   INTERVAL HISTORY:   ***  Objective:  Vital signs in last 24 hours:  Blood pressure 130/84, pulse 94, temperature 98 F (36.7 C), temperature source Oral, resp. rate 18, height 5\' 10"  (1.778 m), weight 239 lb (108.4 kg), SpO2 100 %.    HEENT: *** Lymphatics: *** Resp: *** Cardio: *** GI: *** Vascular: *** Neuro:***  Skin:***   Portacath/PICC-without erythema  Lab Results:  Lab Results  Component Value Date   WBC 8.0 05/04/2021   HGB 12.9 (L) 05/04/2021   HCT 43.5 05/04/2021   MCV 82.7 05/04/2021   PLT 220 05/04/2021   NEUTROABS 3.7 05/04/2021    CMP  Lab Results  Component Value Date   NA 137 05/04/2021   K 3.6 05/04/2021   CL 101 05/04/2021   CO2 23 05/04/2021   GLUCOSE 303 (H) 05/04/2021   BUN <5 (L) 05/04/2021   CREATININE 0.97 05/04/2021   CALCIUM 8.7 (L) 05/04/2021   PROT 7.2 05/04/2021   ALBUMIN 4.1 05/04/2021   AST 37 05/04/2021   ALT 36 05/04/2021   ALKPHOS 114 05/04/2021   BILITOT 0.5 05/04/2021   GFRNONAA >60 05/04/2021   GFRAA >60 01/30/2020    Lab Results  Component Value Date   CEA1 3.19 09/10/2020   CEA 9.11 (H) 05/04/2021    Lab Results  Component Value Date   INR 0.98 01/19/2016   LABPROT 13.0 01/19/2016    Imaging:  CT Chest Wo Contrast  Result Date: 05/03/2021 CLINICAL DATA:  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 Ongoing chemotherapy. EXAM: CT CHEST WITHOUT CONTRAST TECHNIQUE: Multidetector CT imaging of the chest was performed following the standard protocol without IV contrast. COMPARISON:  01/20/2021 chest CT. FINDINGS: Cardiovascular: Normal heart size. Trace pericardial effusion/thickening, stable. Right internal jugular Port-A-Cath terminates in the upper third of the SVC. Mildly atherosclerotic thoracic aorta with stable dilated 4.1 cm ascending thoracic aorta. Stable top-normal caliber main pulmonary artery (3.2 cm diameter).  Mediastinum/Nodes: No discrete thyroid nodules. Unremarkable esophagus. No pathologically enlarged axillary, mediastinal or hilar lymph nodes, noting limited sensitivity for the detection of hilar adenopathy on this noncontrast study. Lungs/Pleura: No pneumothorax. No pleural effusion. No acute consolidative airspace disease. There are at least 7 solid lobulated pulmonary nodules/masses scattered throughout both lungs, all increased since 01/20/2021 chest CT. Representative 3.1 x 2.4 cm right middle lobe mass (series 4/image 81), previously 2.5 x 1.8 cm. Medial basilar right lower lobe 3.4 x 2.2 cm mass (series 4/image 114), increased from 2.6 x 1.8 cm. Posterior left upper lobe 3.8 x 2.1 cm mass along the major fissure (series 4/image 80), increased from 3.4 x 1.9 cm. Superior segment left lower lobe 2.7 x 2.0 cm nodule (series 4/image 71), increased from 2.2 x 1.7 cm. Posterior right lower lobe 1.4 x 0.9 cm nodule (series 4/image 91) is increased from 1.2 x 0.7 cm. No new significant pulmonary nodules. Upper abdomen: Diffuse hepatic steatosis.  Cholelithiasis. Musculoskeletal: No aggressive appearing focal osseous lesions. Mild thoracic spondylosis. IMPRESSION: 1. Interval progression of pulmonary metastases, as detailed. 2. No thoracic adenopathy. 3. Stable dilated 4.1 cm ascending thoracic aorta. Recommend annual imaging followup by CTA or MRA. This recommendation follows 2010 ACCF/AHA/AATS/ACR/ASA/SCA/SCAI/SIR/STS/SVM Guidelines for the Diagnosis and Management of Patients with Thoracic Aortic Disease. Circulation. 2010; 1212011. Aortic aneurysm NOS (ICD10-I71.9). 4. Diffuse hepatic steatosis. 5. Cholelithiasis. 6. Aortic Atherosclerosis (ICD10-I70.0). Electronically Signed   By: : [id number].  On: 05/03/2021 14:13    Medications: I have reviewed the patient's current medications.   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,  biopsy confirmed adenocarcinoma         CTs chest, abdomen, and pelvis on 12/11/2015-indeterminate tiny pulmonary nodules, multiple liver metastases, small nodes in the sigmoid mesocolon Laparoscopic sigmoid colectomy, liver biopsy, Port-A-Cath placement 01/14/2016 Pathology sigmoid colon resection- colonic adenocarcinoma, 5.3 cm extending into pericolonic connective tissue, positive lymph-vascular invasion, positive perineural invasion, negative margins, metastatic carcinoma in 9 of 28 lymph nodes Pathology liver biopsy-metastatic colorectal adenocarcinoma MSI stable; mismatch repair protein normal APC alteration, K RAS wild-type, no BRAF mutation Cycle 1 FOLFOX 02/02/2016 Cycle 2 FOLFOX 02/15/2016 Cycle 3 FOLFOX 02/29/2016 Cycle 4 FOLFOX 03/14/2016 Cycle 5 FOLFOX 03/28/2016 Cycle 6 FOLFOX 04/11/2016 (oxaliplatin held secondary to thrombocytopenia) 04/21/2016 restaging CTs-difficulty evaluating liver lesions due to hepatic steatosis. Stable right adrenal nodule. No adenopathy or local recurrence near the rectosigmoid anastomotic site. Cycle 7 FOLFOX 04/25/2016 MRI liver 05/02/2016-partial improvement in hepatic metastases Cycle 8 FOLFOX 05/10/2016 Cycle 9 FOLFOX 05/23/2016 (oxaliplatin held due to thrombocytopenia) Cycle 10 FOLFOX 06/06/2016 (oxaliplatin dose reduced due to thrombocytopenia) Cycle 11 FOLFOX 06/27/2016 (oxaliplatin held due to neuropathy) Cycle 12 FOLFOX 07/11/2016 (oxaliplatin held) Initiation of maintenance Xeloda 7 days on/7 days off 07/27/2016 MRI liver 11/18/2016-decrease in hepatic metastatic disease. No new or progressive disease identified within the abdomen. Continuation of Xeloda 7 days on/7 days off MRI liver 04/27/2017-previous liver lesions not identified, no new lesions, no lymphadenopathy Xeloda continued 7 days on/7 days off MRI liver 12/04/2017 - no evidence of metastatic disease, hepatic steatosis Xeloda continued 7 days on/7 days off MRI liver 07/15/2018-  no evidence of metastatic disease.  Stable severe hepatic steatosis. Xeloda continued 7 days on/7 days off MRI liver 03/16/2019-hepatic steatosis, no liver mass, focal area of intrahepatic biliary dilatation in segments 2 and 3 of the left lobe-increased Xeloda continued 7 days on/7 days off MRI abdomen 08/19/2019-no findings to suggest liver metastases.  Bilateral lung nodules measuring up to 2.8 cm, progressive and more conspicuous than on previous exam CT chest 08/29/2019-multiple pulmonary metastases, new from 04/21/2016 Cycle 1 FOLFIRI/bevacizumab 09/09/2019 Cycle 2 FOLFIRI/bevacizumab 09/26/2019  Cycle 3 FOLFIRI/bevacizumab 10/10/2019 Cycle 4 FOLFIRI/bevacizumab 10/24/2019 Cycle 5 FOLFIRI/bevacizumab 11/07/2019 CT chest 11/14/2019-decreased size of lung nodules, no new lesions, hepatic steatosis Cycle 6 FOLFIRI/bevacizumab 11/21/2019 Cycle 7 FOLFIRI/bevacizumab 12/05/2019 Cycle 8 FOLFIRI/bevacizumab 12/19/2019 Cycle 9 FOLFIRI/bevacizumab 01/02/2020 Cycle 10 FOLFIRI/bevacizumab 01/16/2020 CT chest 01/29/2020-stable bilateral pulmonary metastases.  No new or progressive metastatic disease in the chest. Cycle 11 FOLFIRI/bevacizumab 01/30/2020 Cycle 12 FOLFIRI/bevacizumab 02/19/2020 Cycle 13 FOLFIRI/bevacizumab 03/12/2020 Cycle 14 FOLFIRI/bevacizumab 04/02/2020 Cycle 15 FOLFIRI/bevacizumab 04/23/2020 Cycle 16 FOLFIRI/bevacizumab 05/21/2020 CT chest 06/09/2020-mild progression pulmonary metastasis.  Some lesions have increased in size while others are similar. Cycle 1 irinotecan/Panitumumab 06/18/2020 Cycle 2 irinotecan/Panitumumab 07/02/2020 Cycle 3 irinotecan/panitumumab 07/16/2020 Cycle 4 irinotecan/Panitumumab 07/30/2020 Cycle 5 irinotecan/Panitumumab 08/13/2020, Emend added Cycle 6 irinotecan/Panitumumab 08/27/2020 CT chest 09/08/2020-decreased size of pulmonary nodules, no progressive disease Cycle 7 irinotecan/panitumumab 09/02/2020 Cycle 8 irinotecan/panitumumab 09/24/2020 Cycle 9 irinotecan/Panitumumab  10/08/2020 10/22/2020 treatment held due to left foot fracture, need for surgery Cycle 10 irinotecan/panitumumab 11/24/2020 Cycle 11 irinotecan/Panitumumab 12/08/2020 Cycle 12 irinotecan/panitumumab 12/22/2020 Cycle 13 irinotecan/panitumumab 01/05/2021 01/20/2021 CT chest-mixed response with minimal increase in size of some lesions and minimal decrease in the size of other lesions.  Overall number of lesions is unchanged. Cycle 14 irinotecan/Panitumumab 02/05/2021 Cycle 15 irinotecan/Panitumumab 02/23/2021 Cycle 16 irinotecan/panitumumab 03/16/2021 Cycle 17  irinotecan/Panitumumab 04/13/2021   2.   Rectal bleeding and constipation secondary to #1   3.   History of peripheral vascular disease, status post left lower extremity vascular bypass surgery in April 2017   4.   History of nephrolithiasis   5.   History of Graves' disease treated with radioactive iodine   6.   Anxiety/depression   7.   Hypertension   8.   Hospitalization 01/19/2016 with wound dehiscence status post secondary suture closure of abdominal wall   9.   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 Hyperglycemia 06/20/2016-diagnosed with diabetes, maintained on insulin   11.  Positive COVID test 12/13/2018; positive COVID test 01/25/2021   12. Hospitalized with seizure activity/DKA. Now on Keppra, insulin adjusted. 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  Left foot fracture-surgical repair 11/11/2020    Disposition: ***  Betsy Coder, MD  05/04/2021  10:04 AM

## 2021-05-05 ENCOUNTER — Encounter: Payer: Self-pay | Admitting: *Deleted

## 2021-05-05 ENCOUNTER — Other Ambulatory Visit: Payer: Self-pay | Admitting: Nurse Practitioner

## 2021-05-05 DIAGNOSIS — C787 Secondary malignant neoplasm of liver and intrahepatic bile duct: Secondary | ICD-10-CM

## 2021-05-05 NOTE — Progress Notes (Signed)
Faxed requested medical records to [facility] at [phone] per their request

## 2021-05-13 ENCOUNTER — Telehealth: Payer: Self-pay | Admitting: *Deleted

## 2021-05-13 NOTE — Telephone Encounter (Signed)
Notified by Sam with [facility] that his case was discussed and has been approved to see Dr. Reynaldo Minium. They will be calling patient with appointment soon. Notified Mr. Annunziato via VM.

## 2021-05-20 ENCOUNTER — Telehealth: Payer: Self-pay | Admitting: *Deleted

## 2021-05-20 NOTE — Telephone Encounter (Signed)
Jeff [name] reports they have not heard from [id number] yet. Provided her phone [phone] to call and follow up.

## 2021-05-23 ENCOUNTER — Other Ambulatory Visit: Payer: Self-pay | Admitting: Oncology

## 2021-05-25 ENCOUNTER — Inpatient Hospital Stay: Payer: Medicaid Other

## 2021-05-25 ENCOUNTER — Telehealth: Payer: Self-pay

## 2021-05-25 ENCOUNTER — Other Ambulatory Visit: Payer: Self-pay

## 2021-05-25 ENCOUNTER — Other Ambulatory Visit (HOSPITAL_BASED_OUTPATIENT_CLINIC_OR_DEPARTMENT_OTHER): Payer: Self-pay

## 2021-05-25 ENCOUNTER — Ambulatory Visit: Payer: Medicaid Other | Attending: Internal Medicine

## 2021-05-25 ENCOUNTER — Inpatient Hospital Stay: Payer: Medicaid Other | Attending: Oncology | Admitting: Oncology

## 2021-05-25 VITALS — BP 144/80 | HR 95 | Temp 97.8°F | Resp 18 | Ht 70.0 in | Wt 237.0 lb

## 2021-05-25 DIAGNOSIS — C187 Malignant neoplasm of sigmoid colon: Secondary | ICD-10-CM | POA: Insufficient documentation

## 2021-05-25 DIAGNOSIS — C7801 Secondary malignant neoplasm of right lung: Secondary | ICD-10-CM | POA: Insufficient documentation

## 2021-05-25 DIAGNOSIS — C7802 Secondary malignant neoplasm of left lung: Secondary | ICD-10-CM | POA: Insufficient documentation

## 2021-05-25 DIAGNOSIS — C787 Secondary malignant neoplasm of liver and intrahepatic bile duct: Secondary | ICD-10-CM

## 2021-05-25 DIAGNOSIS — Z23 Encounter for immunization: Secondary | ICD-10-CM

## 2021-05-25 DIAGNOSIS — Z8616 Personal history of COVID-19: Secondary | ICD-10-CM | POA: Diagnosis not present

## 2021-05-25 DIAGNOSIS — Z95828 Presence of other vascular implants and grafts: Secondary | ICD-10-CM

## 2021-05-25 DIAGNOSIS — C189 Malignant neoplasm of colon, unspecified: Secondary | ICD-10-CM

## 2021-05-25 LAB — BASIC METABOLIC PANEL - CANCER CENTER ONLY
Anion gap: 13 (ref 5–15)
BUN: 6 mg/dL (ref 6–20)
CO2: 23 mmol/L (ref 22–32)
Calcium: 9 mg/dL (ref 8.9–10.3)
Chloride: 100 mmol/L (ref 98–111)
Creatinine: 0.92 mg/dL (ref 0.61–1.24)
GFR, Estimated: >60 mL/min (ref >60)
Glucose, Bld: 269 mg/dL — ABNORMAL HIGH (ref 70–99)
Potassium: 3.6 mmol/L (ref 3.5–5.1)
Sodium: 136 mmol/L (ref 135–145)

## 2021-05-25 LAB — MAGNESIUM: Magnesium: 1.6 mg/dL — ABNORMAL LOW (ref 1.7–2.4)

## 2021-05-25 MED ORDER — PFIZER COVID-19 VAC BIVALENT 30 MCG/0.3ML IM SUSP
INTRAMUSCULAR | 0 refills | Status: DC
  Filled 2021-05-25: qty 0.3, 1d supply, fill #0

## 2021-05-25 MED ORDER — SODIUM CHLORIDE 0.9% FLUSH
10.0000 mL | INTRAVENOUS | Status: DC | PRN
  Administered 2021-05-25: 10 mL via INTRAVENOUS

## 2021-05-25 MED ORDER — HEPARIN SOD (PORK) LOCK FLUSH 100 UNIT/ML IV SOLN
500.0000 [IU] | Freq: Once | INTRAVENOUS | Status: AC | PRN
  Administered 2021-05-25: 500 [IU] via INTRAVENOUS

## 2021-05-25 MED ORDER — POTASSIUM CHLORIDE CRYS ER 20 MEQ PO TBCR: 20.0000 meq | EXTENDED_RELEASE_TABLET | Freq: Every day | ORAL | 0 refills | Status: DC

## 2021-05-25 NOTE — Patient Instructions (Signed)

## 2021-05-25 NOTE — Telephone Encounter (Signed)
Prescription for potassium sent to Pt's pharmacy per Dr Benay Spice

## 2021-05-25 NOTE — Progress Notes (Signed)
°[facility] °OFFICE PROGRESS NOTE ° ° °Diagnosis: Colon cancer ° °INTERVAL HISTORY:  ° °Jeff [name] returns as scheduled.  The skin rash has improved.  No diarrhea.  No new complaint.  He is walking in the evening.  He is scheduled to see Dr. Strickler 06/10/2021. ° °Objective: ° °Vital signs in last 24 hours: ° °Blood pressure (!) 144/80, pulse 95, temperature 97.8 °F (36.6 °C), temperature source Oral, resp. rate 18, height 5' 10" (1.778 m), weight 237 lb (107.5 kg), SpO2 100 %. °  ° °HEENT: No thrush or ulcers °Resp: Lungs clear bilaterally °Cardio: Regular rate and rhythm °GI: No hepatosplenomegaly °Vascular: No leg edema  °Skin: Fading acne type rash over the face ° °Portacath/PICC-without erythema ° °Lab Results: ° °Lab Results  °Component Value Date  ° WBC 8.0 05/04/2021  ° HGB 12.9 (L) 05/04/2021  ° HCT 43.5 05/04/2021  ° MCV 82.7 05/04/2021  ° PLT 220 05/04/2021  ° NEUTROABS 3.7 05/04/2021  ° ° °CMP  °Lab Results  °Component Value Date  ° NA 136 05/25/2021  ° K 3.6 05/25/2021  ° CL 100 05/25/2021  ° CO2 23 05/25/2021  ° GLUCOSE 269 (H) 05/25/2021  ° BUN 6 05/25/2021  ° CREATININE 0.92 05/25/2021  ° CALCIUM 9.0 05/25/2021  ° PROT 7.2 05/04/2021  ° ALBUMIN 4.1 05/04/2021  ° AST 37 05/04/2021  ° ALT 36 05/04/2021  ° ALKPHOS 114 05/04/2021  ° BILITOT 0.5 05/04/2021  ° GFRNONAA >60 05/25/2021  ° GFRAA >60 01/30/2020  ° ° °Lab Results  °Component Value Date  ° CEA1 3.19 09/10/2020  ° CEA 9.11 (H) 05/04/2021  ° ° °Medications: I have reviewed the patient's current medications. 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, biopsy confirmed adenocarcinoma         °CTs chest, abdomen, and pelvis on 12/11/2015-indeterminate tiny pulmonary nodules, multiple liver metastases, small nodes in the sigmoid mesocolon °Laparoscopic sigmoid colectomy, liver biopsy, Port-A-Cath placement 01/14/2016 °Pathology sigmoid colon resection- colonic  adenocarcinoma, 5.3 cm extending into pericolonic connective tissue, positive lymph-vascular invasion, positive perineural invasion, negative margins, metastatic carcinoma in 9 of 28 lymph nodes °Pathology liver biopsy-metastatic colorectal adenocarcinoma °MSI stable; mismatch repair protein normal °APC alteration, K RAS wild-type, no BRAF mutation °Cycle 1 FOLFOX 02/02/2016 °Cycle 2 FOLFOX 02/15/2016 °Cycle 3 FOLFOX 02/29/2016 °Cycle 4 FOLFOX 03/14/2016 °Cycle 5 FOLFOX 03/28/2016 °Cycle 6 FOLFOX 04/11/2016 (oxaliplatin held secondary to thrombocytopenia) °04/21/2016 restaging CTs-difficulty evaluating liver lesions due to hepatic steatosis. Stable right adrenal nodule. No adenopathy or local recurrence near the rectosigmoid anastomotic site. °Cycle 7 FOLFOX 04/25/2016 °MRI liver 05/02/2016-partial improvement in hepatic metastases °Cycle 8 FOLFOX 05/10/2016 °Cycle 9 FOLFOX 05/23/2016 (oxaliplatin held due to thrombocytopenia) °Cycle 10 FOLFOX 06/06/2016 (oxaliplatin dose reduced due to thrombocytopenia) °Cycle 11 FOLFOX 06/27/2016 (oxaliplatin held due to neuropathy) °Cycle 12 FOLFOX 07/11/2016 (oxaliplatin held) °Initiation of maintenance Xeloda 7 days on/7 days off 07/27/2016 °MRI liver 11/18/2016-decrease in hepatic metastatic disease. No new or progressive disease identified within the abdomen. °Continuation of Xeloda 7 days on/7 days off °MRI liver 04/27/2017-previous liver lesions not identified, no new lesions, no lymphadenopathy °Xeloda continued 7 days on/7 days off °MRI liver 12/04/2017 - no evidence of metastatic disease, hepatic steatosis °Xeloda continued 7 days on/7 days off °MRI liver 07/15/2018- no evidence of metastatic disease.  Stable severe hepatic steatosis. °Xeloda continued 7 days on/7 days off °MRI liver 03/16/2019-hepatic steatosis, no liver mass, focal area of intrahepatic biliary dilatation in   in segments 2 and 3 of the left lobe-increased Xeloda continued 7 days on/7 days off MRI abdomen  08/19/2019-no findings to suggest liver metastases.  Bilateral lung nodules measuring up to 2.8 cm, progressive and more conspicuous than on previous exam CT chest 08/29/2019-multiple pulmonary metastases, new from 04/21/2016 Cycle 1 FOLFIRI/bevacizumab 09/09/2019 Cycle 2 FOLFIRI/bevacizumab 09/26/2019  Cycle 3 FOLFIRI/bevacizumab 10/10/2019 Cycle 4 FOLFIRI/bevacizumab 10/24/2019 Cycle 5 FOLFIRI/bevacizumab 11/07/2019 CT chest 11/14/2019-decreased size of lung nodules, no new lesions, hepatic steatosis Cycle 6 FOLFIRI/bevacizumab 11/21/2019 Cycle 7 FOLFIRI/bevacizumab 12/05/2019 Cycle 8 FOLFIRI/bevacizumab 12/19/2019 Cycle 9 FOLFIRI/bevacizumab 01/02/2020 Cycle 10 FOLFIRI/bevacizumab 01/16/2020 CT chest 01/29/2020-stable bilateral pulmonary metastases.  No new or progressive metastatic disease in the chest. Cycle 11 FOLFIRI/bevacizumab 01/30/2020 Cycle 12 FOLFIRI/bevacizumab 02/19/2020 Cycle 13 FOLFIRI/bevacizumab 03/12/2020 Cycle 14 FOLFIRI/bevacizumab 04/02/2020 Cycle 15 FOLFIRI/bevacizumab 04/23/2020 Cycle 16 FOLFIRI/bevacizumab 05/21/2020 CT chest 06/09/2020-mild progression pulmonary metastasis.  Some lesions have increased in size while others are similar. Cycle 1 irinotecan/Panitumumab 06/18/2020 Cycle 2 irinotecan/Panitumumab 07/02/2020 Cycle 3 irinotecan/panitumumab 07/16/2020 Cycle 4 irinotecan/Panitumumab 07/30/2020 Cycle 5 irinotecan/Panitumumab 08/13/2020, Emend added Cycle 6 irinotecan/Panitumumab 08/27/2020 CT chest 09/08/2020-decreased size of pulmonary nodules, no progressive disease Cycle 7 irinotecan/panitumumab 09/02/2020 Cycle 8 irinotecan/panitumumab 09/24/2020 Cycle 9 irinotecan/Panitumumab 10/08/2020 10/22/2020 treatment held due to left foot fracture, need for surgery Cycle 10 irinotecan/panitumumab 11/24/2020 Cycle 11 irinotecan/Panitumumab 12/08/2020 Cycle 12 irinotecan/panitumumab 12/22/2020 Cycle 13 irinotecan/panitumumab 01/05/2021 01/20/2021 CT chest-mixed response with minimal increase in size  of some lesions and minimal decrease in the size of other lesions.  Overall number of lesions is unchanged. Cycle 14 irinotecan/Panitumumab 02/05/2021 Cycle 15 irinotecan/Panitumumab 02/23/2021 Cycle 16 irinotecan/panitumumab 03/16/2021 Cycle 17 irinotecan/Panitumumab 04/13/2021 05/03/2021-CT chest-enlargement of pulmonary metastases, no new lesions   2.   Rectal bleeding and constipation secondary to #1   3.   History of peripheral vascular disease, status post left lower extremity vascular bypass surgery in April 2017   4.   History of nephrolithiasis   5.   History of Graves' disease treated with radioactive iodine   6.   Anxiety/depression   7.   Hypertension   8.   Hospitalization 01/19/2016 with wound dehiscence status post secondary suture closure of abdominal wall   9.   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 Hyperglycemia 06/20/2016-diagnosed with diabetes, maintained on insulin   11.  Positive COVID test 12/13/2018; positive COVID test 01/25/2021   12. Hospitalized with seizure activity/DKA. Now on Keppra, insulin adjusted. 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  Left foot fracture-surgical repair 11/11/2020     Disposition: Jeff [name] appears stable.  We discussed salvage treatment options again today.  He is scheduled to see Dr. Reynaldo Minium 06/10/2021.  He will return for an office visit here on 06/15/2021 to make a treatment plan.  He will receive a COVID-19 vaccine today.  Betsy Coder, MD  05/25/2021  11:42 AM

## 2021-05-25 NOTE — Progress Notes (Signed)
° °  Covid-19 Vaccination Clinic  Name:  Jeff [name]    MRN: [id number] DOB: Dec 04, 1965  05/25/2021  Mr. Diebold was observed post Covid-19 immunization for 15 minutes without incident. He was provided with Vaccine Information Sheet and instruction to access the V-Safe system.   Mr. Lavell was instructed to call 911 with any severe reactions post vaccine: Difficulty breathing  Swelling of face and throat  A fast heartbeat  A bad rash all over body  Dizziness and weakness   Immunizations Administered     Name Date Dose VIS Date Route   Pfizer Covid-19 Vaccine Bivalent Booster 05/25/2021 12:07 PM 0.3 mL 01/13/2021 Intramuscular   Manufacturer: [place]   Lot: [id number]   [place]: [phone]

## 2021-06-09 ENCOUNTER — Encounter: Payer: Self-pay | Admitting: *Deleted

## 2021-06-10 ENCOUNTER — Encounter: Payer: Self-pay | Admitting: Oncology

## 2021-06-10 ENCOUNTER — Ambulatory Visit (HOSPITAL_COMMUNITY)
Admission: RE | Admit: 2021-06-10 | Payer: Medicaid Other | Source: Ambulatory Visit | Attending: Cardiovascular Disease | Admitting: Cardiovascular Disease

## 2021-06-10 NOTE — Progress Notes (Signed)
Call from [facility] w/Dr. Reynaldo Minium requesting all path/molecular reports be faxed to [phone] for his appointment tomorrow. Reports faxed as requested.

## 2021-06-15 ENCOUNTER — Other Ambulatory Visit: Payer: Self-pay

## 2021-06-15 ENCOUNTER — Inpatient Hospital Stay: Payer: Medicaid Other | Admitting: Oncology

## 2021-06-15 ENCOUNTER — Other Ambulatory Visit (HOSPITAL_COMMUNITY): Payer: Self-pay

## 2021-06-15 ENCOUNTER — Inpatient Hospital Stay: Payer: Medicaid Other

## 2021-06-15 ENCOUNTER — Inpatient Hospital Stay (HOSPITAL_BASED_OUTPATIENT_CLINIC_OR_DEPARTMENT_OTHER): Payer: Medicaid Other

## 2021-06-15 VITALS — BP 150/80 | HR 100 | Temp 98.0°F | Resp 18 | Ht 70.0 in | Wt 239.0 lb

## 2021-06-15 DIAGNOSIS — C189 Malignant neoplasm of colon, unspecified: Secondary | ICD-10-CM

## 2021-06-15 DIAGNOSIS — C187 Malignant neoplasm of sigmoid colon: Secondary | ICD-10-CM | POA: Diagnosis not present

## 2021-06-15 DIAGNOSIS — C787 Secondary malignant neoplasm of liver and intrahepatic bile duct: Secondary | ICD-10-CM

## 2021-06-15 DIAGNOSIS — Z95828 Presence of other vascular implants and grafts: Secondary | ICD-10-CM

## 2021-06-15 LAB — CBC WITH DIFFERENTIAL (CANCER CENTER ONLY)
Abs Immature Granulocytes: 0.03 10*3/uL (ref 0.00–0.07)
Basophils Absolute: 0 10*3/uL (ref 0.0–0.1)
Basophils Relative: 0 %
Eosinophils Absolute: 0.3 10*3/uL (ref 0.0–0.5)
Eosinophils Relative: 4 %
HCT: 43.1 % (ref 39.0–52.0)
Hemoglobin: 12.7 g/dL — ABNORMAL LOW (ref 13.0–17.0)
Immature Granulocytes: 0 %
Lymphocytes Relative: 42 %
Lymphs Abs: 3.3 10*3/uL (ref 0.7–4.0)
MCH: 23.7 pg — ABNORMAL LOW (ref 26.0–34.0)
MCHC: 29.5 g/dL — ABNORMAL LOW (ref 30.0–36.0)
MCV: 80.6 fL (ref 80.0–100.0)
Monocytes Absolute: 0.7 10*3/uL (ref 0.1–1.0)
Monocytes Relative: 9 %
Neutro Abs: 3.6 10*3/uL (ref 1.7–7.7)
Neutrophils Relative %: 45 %
Platelet Count: 192 10*3/uL (ref 150–400)
RBC: 5.35 MIL/uL (ref 4.22–5.81)
RDW: 16 % — ABNORMAL HIGH (ref 11.5–15.5)
WBC Count: 7.9 10*3/uL (ref 4.0–10.5)
nRBC: 0 % (ref 0.0–0.2)

## 2021-06-15 LAB — MAGNESIUM: Magnesium: 1.8 mg/dL (ref 1.7–2.4)

## 2021-06-15 LAB — CMP (CANCER CENTER ONLY)
ALT: 51 U/L — ABNORMAL HIGH (ref 0–44)
AST: 47 U/L — ABNORMAL HIGH (ref 15–41)
Albumin: 4 g/dL (ref 3.5–5.0)
Alkaline Phosphatase: 120 U/L (ref 38–126)
Anion gap: 12 (ref 5–15)
BUN: 8 mg/dL (ref 6–20)
CO2: 22 mmol/L (ref 22–32)
Calcium: 9.1 mg/dL (ref 8.9–10.3)
Chloride: 102 mmol/L (ref 98–111)
Creatinine: 0.9 mg/dL (ref 0.61–1.24)
GFR, Estimated: >60 mL/min (ref >60)
Glucose, Bld: 278 mg/dL — ABNORMAL HIGH (ref 70–99)
Potassium: 3.9 mmol/L (ref 3.5–5.1)
Sodium: 136 mmol/L (ref 135–145)
Total Bilirubin: 0.3 mg/dL (ref 0.3–1.2)
Total Protein: 7.6 g/dL (ref 6.5–8.1)

## 2021-06-15 MED ORDER — HEPARIN SOD (PORK) LOCK FLUSH 100 UNIT/ML IV SOLN
500.0000 [IU] | Freq: Once | INTRAVENOUS | Status: AC | PRN
  Administered 2021-06-15: 500 [IU] via INTRAVENOUS

## 2021-06-15 MED ORDER — LONSURF 20-8.19 MG PO TABS
ORAL_TABLET | ORAL | 0 refills | Status: DC
  Filled 2021-06-15: qty 60, fill #0

## 2021-06-15 MED ORDER — SODIUM CHLORIDE 0.9% FLUSH
10.0000 mL | INTRAVENOUS | Status: DC | PRN
  Administered 2021-06-15: 10 mL via INTRAVENOUS

## 2021-06-15 NOTE — Progress Notes (Signed)
[place] OFFICE PROGRESS NOTE   Diagnosis: Colon cancer  INTERVAL HISTORY:   Mr. Raju returns as scheduled.  He is here with his wife.  No new complaint.  Good appetite.  He saw Dr. Reynaldo Minium 06/10/2021.  Dr. Reynaldo Minium is looking into clinical trial options at [facility].  He recommends proceeding with Lonsurf plus bevacizumab at present.    Objective:  Vital signs in last 24 hours:  Blood pressure (!) 150/80, pulse 100, temperature 98 F (36.7 C), temperature source Oral, resp. rate 18, height $RemoveBe'5\' 10"'rkVfjoIdJ$  (1.778 m), weight 239 lb (108.4 kg), SpO2 99 %.    HEENT: No thrush or ulcers Resp: Clear bilaterally Cardio: Regular rate and rhythm GI: No hepatosplenomegaly, nontender, no mass Vascular: No leg edema   Portacath/PICC-without erythema  Lab Results:  Lab Results  Component Value Date   WBC 7.9 06/15/2021   HGB 12.7 (L) 06/15/2021   HCT 43.1 06/15/2021   MCV 80.6 06/15/2021   PLT 192 06/15/2021   NEUTROABS 3.6 06/15/2021    CMP  Lab Results  Component Value Date   NA 136 06/15/2021   K 3.9 06/15/2021   CL 102 06/15/2021   CO2 22 06/15/2021   GLUCOSE 278 (H) 06/15/2021   BUN 8 06/15/2021   CREATININE 0.90 06/15/2021   CALCIUM 9.1 06/15/2021   PROT 7.6 06/15/2021   ALBUMIN 4.0 06/15/2021   AST 47 (H) 06/15/2021   ALT 51 (H) 06/15/2021   ALKPHOS 120 06/15/2021   BILITOT 0.3 06/15/2021   GFRNONAA >60 06/15/2021   GFRAA >60 01/30/2020    Lab Results  Component Value Date   CEA1 3.19 09/10/2020   CEA 9.11 (H) 05/04/2021     Medications: I have reviewed the patient's current medications.   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, biopsy confirmed adenocarcinoma         CTs chest, abdomen, and pelvis on 12/11/2015-indeterminate tiny pulmonary nodules, multiple liver metastases, small nodes in the sigmoid mesocolon Laparoscopic sigmoid colectomy, liver biopsy, Port-A-Cath  placement 01/14/2016 Pathology sigmoid colon resection- colonic adenocarcinoma, 5.3 cm extending into pericolonic connective tissue, positive lymph-vascular invasion, positive perineural invasion, negative margins, metastatic carcinoma in 9 of 28 lymph nodes Pathology liver biopsy-metastatic colorectal adenocarcinoma MSI stable; mismatch repair protein normal APC alteration, K RAS wild-type, no BRAF mutation Cycle 1 FOLFOX 02/02/2016 Cycle 2 FOLFOX 02/15/2016 Cycle 3 FOLFOX 02/29/2016 Cycle 4 FOLFOX 03/14/2016 Cycle 5 FOLFOX 03/28/2016 Cycle 6 FOLFOX 04/11/2016 (oxaliplatin held secondary to thrombocytopenia) 04/21/2016 restaging CTs-difficulty evaluating liver lesions due to hepatic steatosis. Stable right adrenal nodule. No adenopathy or local recurrence near the rectosigmoid anastomotic site. Cycle 7 FOLFOX 04/25/2016 MRI liver 05/02/2016-partial improvement in hepatic metastases Cycle 8 FOLFOX 05/10/2016 Cycle 9 FOLFOX 05/23/2016 (oxaliplatin held due to thrombocytopenia) Cycle 10 FOLFOX 06/06/2016 (oxaliplatin dose reduced due to thrombocytopenia) Cycle 11 FOLFOX 06/27/2016 (oxaliplatin held due to neuropathy) Cycle 12 FOLFOX 07/11/2016 (oxaliplatin held) Initiation of maintenance Xeloda 7 days on/7 days off 07/27/2016 MRI liver 11/18/2016-decrease in hepatic metastatic disease. No new or progressive disease identified within the abdomen. Continuation of Xeloda 7 days on/7 days off MRI liver 04/27/2017-previous liver lesions not identified, no new lesions, no lymphadenopathy Xeloda continued 7 days on/7 days off MRI liver 12/04/2017 - no evidence of metastatic disease, hepatic steatosis Xeloda continued 7 days on/7 days off MRI liver 07/15/2018- no evidence of metastatic disease.  Stable severe hepatic steatosis. Xeloda continued 7 days on/7 days off MRI liver 03/16/2019-hepatic  steatosis, no liver mass, focal area of intrahepatic biliary dilatation in segments 2 and 3 of the left  lobe-increased Xeloda continued 7 days on/7 days off MRI abdomen 08/19/2019-no findings to suggest liver metastases.  Bilateral lung nodules measuring up to 2.8 cm, progressive and more conspicuous than on previous exam CT chest 08/29/2019-multiple pulmonary metastases, new from 04/21/2016 Cycle 1 FOLFIRI/bevacizumab 09/09/2019 Cycle 2 FOLFIRI/bevacizumab 09/26/2019  Cycle 3 FOLFIRI/bevacizumab 10/10/2019 Cycle 4 FOLFIRI/bevacizumab 10/24/2019 Cycle 5 FOLFIRI/bevacizumab 11/07/2019 CT chest 11/14/2019-decreased size of lung nodules, no new lesions, hepatic steatosis Cycle 6 FOLFIRI/bevacizumab 11/21/2019 Cycle 7 FOLFIRI/bevacizumab 12/05/2019 Cycle 8 FOLFIRI/bevacizumab 12/19/2019 Cycle 9 FOLFIRI/bevacizumab 01/02/2020 Cycle 10 FOLFIRI/bevacizumab 01/16/2020 CT chest 01/29/2020-stable bilateral pulmonary metastases.  No new or progressive metastatic disease in the chest. Cycle 11 FOLFIRI/bevacizumab 01/30/2020 Cycle 12 FOLFIRI/bevacizumab 02/19/2020 Cycle 13 FOLFIRI/bevacizumab 03/12/2020 Cycle 14 FOLFIRI/bevacizumab 04/02/2020 Cycle 15 FOLFIRI/bevacizumab 04/23/2020 Cycle 16 FOLFIRI/bevacizumab 05/21/2020 CT chest 06/09/2020-mild progression pulmonary metastasis.  Some lesions have increased in size while others are similar. Cycle 1 irinotecan/Panitumumab 06/18/2020 Cycle 2 irinotecan/Panitumumab 07/02/2020 Cycle 3 irinotecan/panitumumab 07/16/2020 Cycle 4 irinotecan/Panitumumab 07/30/2020 Cycle 5 irinotecan/Panitumumab 08/13/2020, Emend added Cycle 6 irinotecan/Panitumumab 08/27/2020 CT chest 09/08/2020-decreased size of pulmonary nodules, no progressive disease Cycle 7 irinotecan/panitumumab 09/02/2020 Cycle 8 irinotecan/panitumumab 09/24/2020 Cycle 9 irinotecan/Panitumumab 10/08/2020 10/22/2020 treatment held due to left foot fracture, need for surgery Cycle 10 irinotecan/panitumumab 11/24/2020 Cycle 11 irinotecan/Panitumumab 12/08/2020 Cycle 12 irinotecan/panitumumab 12/22/2020 Cycle 13 irinotecan/panitumumab  01/05/2021 01/20/2021 CT chest-mixed response with minimal increase in size of some lesions and minimal decrease in the size of other lesions.  Overall number of lesions is unchanged. Cycle 14 irinotecan/Panitumumab 02/05/2021 Cycle 15 irinotecan/Panitumumab 02/23/2021 Cycle 16 irinotecan/panitumumab 03/16/2021 Cycle 17 irinotecan/Panitumumab 04/13/2021 05/03/2021-CT chest-enlargement of pulmonary metastases, no new lesions 06/21/2021-cycle 1 Lonsurf/bevacizumab   2.   Rectal bleeding and constipation secondary to #1   3.   History of peripheral vascular disease, status post left lower extremity vascular bypass surgery in April 2017   4.   History of nephrolithiasis   5.   History of Graves' disease treated with radioactive iodine   6.   Anxiety/depression   7.   Hypertension   8.   Hospitalization 01/19/2016 with wound dehiscence status post secondary suture closure of abdominal wall   9.   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 Hyperglycemia 06/20/2016-diagnosed with diabetes, maintained on insulin   11.  Positive COVID test 12/13/2018; positive COVID test 01/25/2021   12. Hospitalized with seizure activity/DKA. Now on Keppra, insulin adjusted. 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  Left foot fracture-surgical repair 11/11/2020     Disposition: Mr. Evilsizer appears stable.  He saw Dr. Reynaldo Minium to discuss standard and clinical trial options.  He is undergoing review for eligibility for clinical trials at [facility].  The plan is to begin standard salvage therapy with Lonsurf/bevacizumab.  We reviewed potential toxicities associated with Lonsurf including the chance of nausea, diarrhea, and hematologic toxicity.  We reviewed  the allergic reaction, hypertension, bleeding, thromboembolic disease, renal toxicity, and CNS toxicity associated with bevacizumab.  He agrees to proceed.  The plan is to begin cycle 1 Lonsurf on 06/21/2021.  He will return for a first treatment with bevacizumab on 06/22/2021.  Mr. Fontenot will be scheduled for an office visit and bevacizumab on 07/06/2021.  Betsy Coder,  MD  06/15/2021  10:35 AM

## 2021-06-16 ENCOUNTER — Other Ambulatory Visit (HOSPITAL_COMMUNITY): Payer: Self-pay

## 2021-06-16 ENCOUNTER — Other Ambulatory Visit: Payer: Self-pay | Admitting: Cardiovascular Disease

## 2021-06-16 ENCOUNTER — Telehealth: Payer: Self-pay | Admitting: Pharmacy Technician

## 2021-06-16 DIAGNOSIS — I10 Essential (primary) hypertension: Secondary | ICD-10-CM

## 2021-06-16 NOTE — Telephone Encounter (Signed)
Oral Oncology Patient Advocate Encounter  After completing a benefits investigation, prior authorization for Jeff [name] is not required at this time through [place].  [place] Patient [place] Phone [phone] Fax [phone] 06/16/2021 9:07 AM

## 2021-06-16 NOTE — Telephone Encounter (Signed)
Please review

## 2021-06-17 ENCOUNTER — Telehealth: Payer: Self-pay | Admitting: Pharmacist

## 2021-06-17 ENCOUNTER — Telehealth: Payer: Self-pay

## 2021-06-17 ENCOUNTER — Other Ambulatory Visit (HOSPITAL_COMMUNITY): Payer: Self-pay

## 2021-06-17 DIAGNOSIS — C189 Malignant neoplasm of colon, unspecified: Secondary | ICD-10-CM

## 2021-06-17 MED ORDER — LONSURF 20-8.19 MG PO TABS
60.0000 mg | ORAL_TABLET | Freq: Two times a day (BID) | ORAL | 0 refills | Status: DC
  Filled 2021-06-17: qty 60, 10d supply, fill #0
  Filled 2021-06-18: qty 60, 28d supply, fill #0

## 2021-06-17 NOTE — Telephone Encounter (Signed)
TC from [name] inquiring about Lonsurf prescription Pt is suppose to start on Monday 06/21/21 explained to [name] It takes about 5 business days for approval for the medication and I see pharmacy is working on it. Informed [name] she should be hearing from pharmacy soon. [name] verbalized understanding.

## 2021-06-17 NOTE — Telephone Encounter (Signed)
Oral Oncology Pharmacist Encounter  Received new prescription for Lonsurf (trifluridine/tipiracil)  for the treatment of metastatic colon cancer in conjunction with bevacizumab, planned duration until disease progression or unacceptable drug toxicity. Planned start 06/21/21.  CMP/CBC from 06/15/21 assessed, no relevant lab abnormalities. Prescription dose and frequency assessed.   Current medication list in Epic reviewed, no DDIs with Lonsurf identified.  Evaluated chart and no patient barriers to medication adherence identified.   Prescription has been e-scribed to the [facility] for benefits analysis and approval.  Oral Oncology Clinic will continue to follow for insurance authorization, copayment issues, initial counseling and start date.  Patient agreed to treatment on 06/15/21 per MD documentation.  Darl Pikes, PharmD, BCPS, BCOP, CPP Hematology/Oncology Clinical Pharmacist Practitioner [place]/DB/AP Oral [facility] [phone]  06/17/2021 12:49 PM

## 2021-06-18 ENCOUNTER — Encounter: Payer: Self-pay | Admitting: Oncology

## 2021-06-18 ENCOUNTER — Other Ambulatory Visit (HOSPITAL_COMMUNITY): Payer: Self-pay

## 2021-06-18 NOTE — Telephone Encounter (Signed)
Oral Chemotherapy Pharmacist Encounter  Patient/wife will pick up [place] from [place] tomorrow 06/19/21.  Patient Education I spoke with patient's wife Laverne for overview of new oral chemotherapy medication: Lonsurf (trifluridine/tipiracil)  for the treatment of metastatic colon cancer in conjunction with bevacizumab, planned duration until disease progression or unacceptable drug toxicity. Planned start 06/21/21.   Counseled Laverne on administration, dosing, side effects, monitoring, drug-food interactions, safe handling, storage, and disposal. Patient will take 3 tablets (60 mg of trifluridine total) by mouth 2 (two) times daily after a meal. Take 1 hr after AM & PM meals on days 1-5, 8-12. Repeat every 28 day.  Side effects include but not limited to: nausea, diarrhea, decreased wbc/hgb/plt.    Reviewed with Laverne importance of keeping a medication schedule and plan for any missed doses.  After discussion with Laverne, no patient barriers to medication adherence identified.   Laverne  voiced understanding and appreciation. All questions answered. Medication handout provided.  Provided Laverne with [facility] phone number. Laverne knows to call the office with questions or concerns. Oral Chemotherapy Navigation Clinic will continue to follow.  Darl Pikes, PharmD, BCPS, BCOP, CPP Hematology/Oncology Clinical Pharmacist Practitioner [place]/DB/AP [facility] [phone]  06/18/2021 3:20 PM

## 2021-06-22 ENCOUNTER — Inpatient Hospital Stay: Payer: Medicaid Other | Attending: Oncology

## 2021-06-22 ENCOUNTER — Other Ambulatory Visit: Payer: Self-pay | Admitting: *Deleted

## 2021-06-22 ENCOUNTER — Encounter: Payer: Self-pay | Admitting: *Deleted

## 2021-06-22 ENCOUNTER — Other Ambulatory Visit: Payer: Self-pay

## 2021-06-22 VITALS — BP 147/99 | HR 110 | Temp 98.3°F | Resp 18

## 2021-06-22 DIAGNOSIS — C7801 Secondary malignant neoplasm of right lung: Secondary | ICD-10-CM | POA: Diagnosis not present

## 2021-06-22 DIAGNOSIS — C787 Secondary malignant neoplasm of liver and intrahepatic bile duct: Secondary | ICD-10-CM | POA: Diagnosis present

## 2021-06-22 DIAGNOSIS — C7802 Secondary malignant neoplasm of left lung: Secondary | ICD-10-CM | POA: Diagnosis not present

## 2021-06-22 DIAGNOSIS — Z5112 Encounter for antineoplastic immunotherapy: Secondary | ICD-10-CM | POA: Diagnosis present

## 2021-06-22 DIAGNOSIS — C187 Malignant neoplasm of sigmoid colon: Secondary | ICD-10-CM | POA: Diagnosis present

## 2021-06-22 MED ORDER — HEPARIN SOD (PORK) LOCK FLUSH 100 UNIT/ML IV SOLN
500.0000 [IU] | Freq: Once | INTRAVENOUS | Status: AC | PRN
  Administered 2021-06-22: 500 [IU]

## 2021-06-22 MED ORDER — LORAZEPAM 1 MG PO TABS
0.5000 mg | ORAL_TABLET | Freq: Once | ORAL | Status: AC
  Administered 2021-06-22: 0.5 mg via ORAL
  Filled 2021-06-22: qty 1

## 2021-06-22 MED ORDER — SODIUM CHLORIDE 0.9 % IV SOLN: Freq: Once | INTRAVENOUS | Status: AC

## 2021-06-22 MED ORDER — SODIUM CHLORIDE 0.9 % IV SOLN
5.0000 mg/kg | Freq: Once | INTRAVENOUS | Status: AC
  Administered 2021-06-22: 500 mg via INTRAVENOUS
  Filled 2021-06-22: qty 4

## 2021-06-22 MED ORDER — SODIUM CHLORIDE 0.9% FLUSH
10.0000 mL | INTRAVENOUS | Status: DC | PRN
  Administered 2021-06-22: 10 mL

## 2021-06-22 NOTE — Progress Notes (Signed)
Patient presents for treatment. RN assessment completed along with the following:  Labs/vitals reviewed - Yes, and Per Dr. Benay Spice, ok to treat with labs from 06/15/21, urine protein to be collected prior to next treatment. Per Ned Card, NP ok to treat with HR 106.    Weight within 10% of previous measurement - Yes Informed consent completed and reflects current therapy/intent - Yes, on date 09/09/19             Provider progress note reviewed - Patient not seen by provider today. Most recent note dated 06/15/21 reviewed. Treatment/Antibody/Supportive plan reviewed - Yes, and there are no adjustments needed for today's treatment. S&H and other orders reviewed - Yes, and there are no additional orders identified. Previous treatment date reviewed - Yes, and the appropriate amount of time has elapsed between treatments.   Patient to proceed with treatment.    Patient arrived to infusion room and stated he forgot to take his PO nausea med this AM (ativan 0.5 PO).  Patient vomited x1.  Message sent to Merceda Elks, RN who obtained a verbal order for PO ativan (see MAR).

## 2021-06-22 NOTE — Patient Instructions (Signed)
[facility]  Discharge Instructions: Thank you for choosing [facility] to provide your oncology and hematology care.   If you have a lab appointment with the [facility], please go directly to the [facility] and check in at the registration area.   Wear comfortable clothing and clothing appropriate for easy access to any Portacath or PICC line.   We strive to give you quality time with your provider. You may need to reschedule your appointment if you arrive late (15 or more minutes).  Arriving late affects you and other patients whose appointments are after yours.  Also, if you miss three or more appointments without notifying the office, you may be dismissed from the clinic at the providers discretion.      For prescription refill requests, have your pharmacy contact our office and allow 72 hours for refills to be completed.    Today you received the following chemotherapy and/or immunotherapy agents: Bevacizumab  Bevacizumab injection What is this medication? BEVACIZUMAB (be va SIZ yoo mab) is a monoclonal antibody. It is used to treat many types of cancer. This medicine may be used for other purposes; ask your health care provider or pharmacist if you have questions. COMMON BRAND NAME(S): Alymsys, Avastin, MVASI, [name] What should I tell my care team before I take this medication? They need to know if you have any of these conditions: diabetes heart disease high blood pressure history of coughing up blood prior anthracycline chemotherapy (e.g., doxorubicin, daunorubicin, epirubicin) recent or ongoing radiation therapy recent or planning to have surgery stroke an unusual or allergic reaction to bevacizumab, hamster proteins, mouse proteins, other medicines, foods, dyes, or preservatives pregnant or trying to get pregnant breast-feeding How should I use this medication? This medicine is for infusion into a vein. It is given by a  health care professional in a hospital or clinic setting. Talk to your pediatrician regarding the use of this medicine in children. Special care may be needed. Overdosage: If you think you have taken too much of this medicine contact a poison control center or emergency room at once. NOTE: This medicine is only for you. Do not share this medicine with others. What if I miss a dose? It is important not to miss your dose. Call your doctor or health care professional if you are unable to keep an appointment. What may interact with this medication? Interactions are not expected. This list may not describe all possible interactions. Give your health care provider a list of all the medicines, herbs, non-prescription drugs, or dietary supplements you use. Also tell them if you smoke, drink alcohol, or use illegal drugs. Some items may interact with your medicine. What should I watch for while using this medication? Your condition will be monitored carefully while you are receiving this medicine. You will need important blood work and urine testing done while you are taking this medicine. This medicine may increase your risk to bruise or bleed. Call your doctor or health care professional if you notice any unusual bleeding. Before having surgery, talk to your health care provider to make sure it is ok. This drug can increase the risk of poor healing of your surgical site or wound. You will need to stop this drug for 28 days before surgery. After surgery, wait at least 28 days before restarting this drug. Make sure the surgical site or wound is healed enough before restarting this drug. Talk to your health care provider if questions. Do  not become pregnant while taking this medicine or for 6 months after stopping it. Women should inform their doctor if they wish to become pregnant or think they might be pregnant. There is a potential for serious side effects to an unborn child. Talk to your health care  professional or pharmacist for more information. Do not breast-feed an infant while taking this medicine and for 6 months after the last dose. This medicine has caused ovarian failure in some women. This medicine may interfere with the ability to have a child. You should talk to your doctor or health care professional if you are concerned about your fertility. What side effects may I notice from receiving this medication? Side effects that you should report to your doctor or health care professional as soon as possible: allergic reactions like skin rash, itching or hives, swelling of the face, lips, or tongue chest pain or chest tightness chills coughing up blood high fever seizures severe constipation signs and symptoms of bleeding such as bloody or black, tarry stools; red or dark-brown urine; spitting up blood or brown material that looks like coffee grounds; red spots on the skin; unusual bruising or bleeding from the eye, gums, or nose signs and symptoms of a blood clot such as breathing problems; chest pain; severe, sudden headache; pain, swelling, warmth in the leg signs and symptoms of a stroke like changes in vision; confusion; trouble speaking or understanding; severe headaches; sudden numbness or weakness of the face, arm or leg; trouble walking; dizziness; loss of balance or coordination stomach pain sweating swelling of legs or ankles vomiting weight gain Side effects that usually do not require medical attention (report to your doctor or health care professional if they continue or are bothersome): back pain changes in taste decreased appetite dry skin nausea tiredness This list may not describe all possible side effects. Call your doctor for medical advice about side effects. You may report side effects to FDA at 1-800-FDA-1088. Where should I keep my medication? This drug is given in a hospital or clinic and will not be stored at home. NOTE: This sheet is a summary. It  may not cover all possible information. If you have questions about this medicine, talk to your doctor, pharmacist, or health care provider.  2022 Elsevier/Gold Standard (2021-01-19 00:00:00)       To help prevent nausea and vomiting after your treatment, we encourage you to take your nausea medication as directed.  BELOW ARE SYMPTOMS THAT SHOULD BE REPORTED IMMEDIATELY: *FEVER GREATER THAN 100.4 F (38 C) OR HIGHER *CHILLS OR SWEATING *NAUSEA AND VOMITING THAT IS NOT CONTROLLED WITH YOUR NAUSEA MEDICATION *UNUSUAL SHORTNESS OF BREATH *UNUSUAL BRUISING OR BLEEDING *URINARY PROBLEMS (pain or burning when urinating, or frequent urination) *BOWEL PROBLEMS (unusual diarrhea, constipation, pain near the anus) TENDERNESS IN MOUTH AND THROAT WITH OR WITHOUT PRESENCE OF ULCERS (sore throat, sores in mouth, or a toothache) UNUSUAL RASH, SWELLING OR PAIN  UNUSUAL VAGINAL DISCHARGE OR ITCHING   Items with * indicate a potential emergency and should be followed up as soon as possible or go to the Emergency Department if any problems should occur.  Please show the CHEMOTHERAPY ALERT CARD or IMMUNOTHERAPY ALERT CARD at check-in to the Emergency Department and triage nurse.  Should you have questions after your visit or need to cancel or reschedule your appointment, please contact [facility]  Dept: [phone]  and follow the prompts.  Office hours are 8:00 a.m. to 4:30 p.m. Monday -  Friday. Please note that voicemails left after 4:00 p.m. may not be returned until the following business day.  We are closed weekends and major holidays. You have access to a nurse at all times for urgent questions. Please call the main number to the clinic Dept: [phone] and follow the prompts.   For any non-urgent questions, you may also contact your provider using MyChart. We now offer e-Visits for anyone 55 and older to request care online for non-urgent symptoms. For details visit  [email or web address].   Also download the MyChart app! Go to the app store, search "MyChart", open the app, select [facility], and log in with your MyChart username and password.  Due to Covid, a mask is required upon entering the hospital/clinic. If you do not have a mask, one will be given to you upon arrival. For doctor visits, patients may have 1 support person aged 10 or older with them. For treatment visits, patients cannot have anyone with them due to current Covid guidelines and our immunocompromised population.

## 2021-06-22 NOTE — Progress Notes (Signed)
Per Dr. Stevphen Meuse to treat today with labs from 05/3121. Will collect urine protein with 2nd treatment.

## 2021-06-29 ENCOUNTER — Other Ambulatory Visit: Payer: Self-pay | Admitting: Neurology

## 2021-06-30 ENCOUNTER — Ambulatory Visit (HOSPITAL_COMMUNITY)
Admission: RE | Admit: 2021-06-30 | Discharge: 2021-06-30 | Disposition: A | Discharge status: Home / Self Care | Payer: Medicaid Other | Source: Ambulatory Visit | Attending: Cardiology | Admitting: Cardiology

## 2021-06-30 ENCOUNTER — Other Ambulatory Visit: Payer: Self-pay

## 2021-06-30 ENCOUNTER — Other Ambulatory Visit (HOSPITAL_COMMUNITY): Payer: Self-pay | Admitting: Cardiovascular Disease

## 2021-06-30 ENCOUNTER — Other Ambulatory Visit (HOSPITAL_COMMUNITY): Payer: Self-pay

## 2021-06-30 DIAGNOSIS — I739 Peripheral vascular disease, unspecified: Secondary | ICD-10-CM | POA: Diagnosis present

## 2021-06-30 DIAGNOSIS — Z9889 Other specified postprocedural states: Secondary | ICD-10-CM

## 2021-07-01 ENCOUNTER — Other Ambulatory Visit (HOSPITAL_COMMUNITY): Payer: Self-pay

## 2021-07-04 ENCOUNTER — Other Ambulatory Visit: Payer: Self-pay | Admitting: Oncology

## 2021-07-06 ENCOUNTER — Inpatient Hospital Stay: Payer: Medicaid Other

## 2021-07-06 ENCOUNTER — Other Ambulatory Visit: Payer: Self-pay

## 2021-07-06 ENCOUNTER — Other Ambulatory Visit: Payer: Self-pay | Admitting: *Deleted

## 2021-07-06 ENCOUNTER — Inpatient Hospital Stay: Payer: Medicaid Other | Admitting: Oncology

## 2021-07-06 ENCOUNTER — Encounter: Payer: Self-pay | Admitting: *Deleted

## 2021-07-06 ENCOUNTER — Other Ambulatory Visit (HOSPITAL_COMMUNITY): Payer: Self-pay

## 2021-07-06 VITALS — BP 145/90 | HR 99 | Temp 97.8°F | Resp 18 | Ht 70.0 in | Wt 233.0 lb

## 2021-07-06 VITALS — BP 139/93 | HR 95 | Resp 18

## 2021-07-06 DIAGNOSIS — C189 Malignant neoplasm of colon, unspecified: Secondary | ICD-10-CM

## 2021-07-06 DIAGNOSIS — C787 Secondary malignant neoplasm of liver and intrahepatic bile duct: Secondary | ICD-10-CM

## 2021-07-06 DIAGNOSIS — C187 Malignant neoplasm of sigmoid colon: Secondary | ICD-10-CM

## 2021-07-06 DIAGNOSIS — Z5112 Encounter for antineoplastic immunotherapy: Secondary | ICD-10-CM | POA: Diagnosis not present

## 2021-07-06 LAB — CBC WITH DIFFERENTIAL (CANCER CENTER ONLY)
Abs Immature Granulocytes: 0.04 10*3/uL (ref 0.00–0.07)
Basophils Absolute: 0 10*3/uL (ref 0.0–0.1)
Basophils Relative: 0 %
Eosinophils Absolute: 0.2 10*3/uL (ref 0.0–0.5)
Eosinophils Relative: 2 %
HCT: 41.9 % (ref 39.0–52.0)
Hemoglobin: 12.5 g/dL — ABNORMAL LOW (ref 13.0–17.0)
Immature Granulocytes: 1 %
Lymphocytes Relative: 36 %
Lymphs Abs: 3 10*3/uL (ref 0.7–4.0)
MCH: 24 pg — ABNORMAL LOW (ref 26.0–34.0)
MCHC: 29.8 g/dL — ABNORMAL LOW (ref 30.0–36.0)
MCV: 80.6 fL (ref 80.0–100.0)
Monocytes Absolute: 0.4 10*3/uL (ref 0.1–1.0)
Monocytes Relative: 5 %
Neutro Abs: 4.8 10*3/uL (ref 1.7–7.7)
Neutrophils Relative %: 56 %
Platelet Count: 204 10*3/uL (ref 150–400)
RBC: 5.2 MIL/uL (ref 4.22–5.81)
RDW: 17.8 % — ABNORMAL HIGH (ref 11.5–15.5)
WBC Count: 8.3 10*3/uL (ref 4.0–10.5)
nRBC: 0.2 % (ref 0.0–0.2)

## 2021-07-06 LAB — CMP (CANCER CENTER ONLY)
ALT: 37 U/L (ref 0–44)
AST: 38 U/L (ref 15–41)
Albumin: 4.1 g/dL (ref 3.5–5.0)
Alkaline Phosphatase: 103 U/L (ref 38–126)
Anion gap: 13 (ref 5–15)
BUN: 8 mg/dL (ref 6–20)
CO2: 24 mmol/L (ref 22–32)
Calcium: 9 mg/dL (ref 8.9–10.3)
Chloride: 100 mmol/L (ref 98–111)
Creatinine: 0.88 mg/dL (ref 0.61–1.24)
GFR, Estimated: >60 mL/min (ref >60)
Glucose, Bld: 303 mg/dL — ABNORMAL HIGH (ref 70–99)
Potassium: 3.9 mmol/L (ref 3.5–5.1)
Sodium: 137 mmol/L (ref 135–145)
Total Bilirubin: 0.5 mg/dL (ref 0.3–1.2)
Total Protein: 7.4 g/dL (ref 6.5–8.1)

## 2021-07-06 LAB — TOTAL PROTEIN, URINE DIPSTICK: Protein, ur: NEGATIVE mg/dL

## 2021-07-06 LAB — MAGNESIUM: Magnesium: 1.8 mg/dL (ref 1.7–2.4)

## 2021-07-06 LAB — CEA (ACCESS): CEA (CHCC): 21.73 ng/mL — ABNORMAL HIGH (ref 0.00–5.00)

## 2021-07-06 MED ORDER — LONSURF 20-8.19 MG PO TABS
60.0000 mg | ORAL_TABLET | Freq: Two times a day (BID) | ORAL | 0 refills | Status: DC
  Filled 2021-07-06 – 2021-07-21 (×4): qty 60, 10d supply, fill #0

## 2021-07-06 MED ORDER — SODIUM CHLORIDE 0.9 % IV SOLN
5.0000 mg/kg | Freq: Once | INTRAVENOUS | Status: AC
  Administered 2021-07-06: 500 mg via INTRAVENOUS
  Filled 2021-07-06: qty 16

## 2021-07-06 MED ORDER — SODIUM CHLORIDE 0.9% FLUSH: 10.0000 mL | INTRAVENOUS | Status: DC | PRN

## 2021-07-06 MED ORDER — SODIUM CHLORIDE 0.9 % IV SOLN: Freq: Once | INTRAVENOUS | Status: AC

## 2021-07-06 MED ORDER — HEPARIN SOD (PORK) LOCK FLUSH 100 UNIT/ML IV SOLN: 500.0000 [IU] | Freq: Once | INTRAVENOUS | Status: DC | PRN

## 2021-07-06 NOTE — Progress Notes (Signed)
[facility] OFFICE PROGRESS NOTE   Diagnosis: Colon cancer  INTERVAL HISTORY:   Jeff [name] completed cycle 1 Lonsurf/Avastin beginning 06/22/2021.  No nausea, diarrhea, or mouth sores.  No bleeding or symptom of thrombosis.  He has noted darkening and loosening of the great toenail bilaterally.  Objective:  Vital signs in last 24 hours:  Blood pressure (!) 145/90, pulse 99, temperature 97.8 F (36.6 C), temperature source Oral, resp. rate 18, height $RemoveBe'5\' 10"'KvrfXgjeP$  (1.778 m), weight 233 lb (105.7 kg), SpO2 100 %.    HEENT: No thrush or ulcers Resp: Lungs clear bilaterally Cardio: Regular rate and rhythm GI: No hepatosplenomegaly Vascular: No leg edema  Skin: Dryness of the soles, new nail growth at the great toe bilaterally with hyperpigmentation  Portacath/PICC-without erythema  Lab Results:  Lab Results  Component Value Date   WBC 7.9 06/15/2021   HGB 12.7 (L) 06/15/2021   HCT 43.1 06/15/2021   MCV 80.6 06/15/2021   PLT 192 06/15/2021   NEUTROABS 3.6 06/15/2021    CMP  Lab Results  Component Value Date   NA 136 06/15/2021   K 3.9 06/15/2021   CL 102 06/15/2021   CO2 22 06/15/2021   GLUCOSE 278 (H) 06/15/2021   BUN 8 06/15/2021   CREATININE 0.90 06/15/2021   CALCIUM 9.1 06/15/2021   PROT 7.6 06/15/2021   ALBUMIN 4.0 06/15/2021   AST 47 (H) 06/15/2021   ALT 51 (H) 06/15/2021   ALKPHOS 120 06/15/2021   BILITOT 0.3 06/15/2021   GFRNONAA >60 06/15/2021   GFRAA >60 01/30/2020    Lab Results  Component Value Date   CEA1 3.19 09/10/2020   CEA 9.11 (H) 05/04/2021     Medications: I have reviewed the patient's current medications.   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, biopsy confirmed adenocarcinoma         CTs chest, abdomen, and pelvis on 12/11/2015-indeterminate tiny pulmonary nodules, multiple liver metastases, small nodes in the sigmoid mesocolon Laparoscopic sigmoid  colectomy, liver biopsy, Port-A-Cath placement 01/14/2016 Pathology sigmoid colon resection- colonic adenocarcinoma, 5.3 cm extending into pericolonic connective tissue, positive lymph-vascular invasion, positive perineural invasion, negative margins, metastatic carcinoma in 9 of 28 lymph nodes Pathology liver biopsy-metastatic colorectal adenocarcinoma MSI stable; mismatch repair protein normal APC alteration, K RAS wild-type, no BRAF mutation Cycle 1 FOLFOX 02/02/2016 Cycle 2 FOLFOX 02/15/2016 Cycle 3 FOLFOX 02/29/2016 Cycle 4 FOLFOX 03/14/2016 Cycle 5 FOLFOX 03/28/2016 Cycle 6 FOLFOX 04/11/2016 (oxaliplatin held secondary to thrombocytopenia) 04/21/2016 restaging CTs-difficulty evaluating liver lesions due to hepatic steatosis. Stable right adrenal nodule. No adenopathy or local recurrence near the rectosigmoid anastomotic site. Cycle 7 FOLFOX 04/25/2016 MRI liver 05/02/2016-partial improvement in hepatic metastases Cycle 8 FOLFOX 05/10/2016 Cycle 9 FOLFOX 05/23/2016 (oxaliplatin held due to thrombocytopenia) Cycle 10 FOLFOX 06/06/2016 (oxaliplatin dose reduced due to thrombocytopenia) Cycle 11 FOLFOX 06/27/2016 (oxaliplatin held due to neuropathy) Cycle 12 FOLFOX 07/11/2016 (oxaliplatin held) Initiation of maintenance Xeloda 7 days on/7 days off 07/27/2016 MRI liver 11/18/2016-decrease in hepatic metastatic disease. No new or progressive disease identified within the abdomen. Continuation of Xeloda 7 days on/7 days off MRI liver 04/27/2017-previous liver lesions not identified, no new lesions, no lymphadenopathy Xeloda continued 7 days on/7 days off MRI liver 12/04/2017 - no evidence of metastatic disease, hepatic steatosis Xeloda continued 7 days on/7 days off MRI liver 07/15/2018- no evidence of metastatic disease.  Stable severe hepatic steatosis. Xeloda continued 7 days on/7 days off MRI liver 03/16/2019-hepatic  steatosis, no liver mass, focal area of intrahepatic biliary dilatation  in segments 2 and 3 of the left lobe-increased Xeloda continued 7 days on/7 days off MRI abdomen 08/19/2019-no findings to suggest liver metastases.  Bilateral lung nodules measuring up to 2.8 cm, progressive and more conspicuous than on previous exam CT chest 08/29/2019-multiple pulmonary metastases, new from 04/21/2016 Cycle 1 FOLFIRI/bevacizumab 09/09/2019 Cycle 2 FOLFIRI/bevacizumab 09/26/2019  Cycle 3 FOLFIRI/bevacizumab 10/10/2019 Cycle 4 FOLFIRI/bevacizumab 10/24/2019 Cycle 5 FOLFIRI/bevacizumab 11/07/2019 CT chest 11/14/2019-decreased size of lung nodules, no new lesions, hepatic steatosis Cycle 6 FOLFIRI/bevacizumab 11/21/2019 Cycle 7 FOLFIRI/bevacizumab 12/05/2019 Cycle 8 FOLFIRI/bevacizumab 12/19/2019 Cycle 9 FOLFIRI/bevacizumab 01/02/2020 Cycle 10 FOLFIRI/bevacizumab 01/16/2020 CT chest 01/29/2020-stable bilateral pulmonary metastases.  No new or progressive metastatic disease in the chest. Cycle 11 FOLFIRI/bevacizumab 01/30/2020 Cycle 12 FOLFIRI/bevacizumab 02/19/2020 Cycle 13 FOLFIRI/bevacizumab 03/12/2020 Cycle 14 FOLFIRI/bevacizumab 04/02/2020 Cycle 15 FOLFIRI/bevacizumab 04/23/2020 Cycle 16 FOLFIRI/bevacizumab 05/21/2020 CT chest 06/09/2020-mild progression pulmonary metastasis.  Some lesions have increased in size while others are similar. Cycle 1 irinotecan/Panitumumab 06/18/2020 Cycle 2 irinotecan/Panitumumab 07/02/2020 Cycle 3 irinotecan/panitumumab 07/16/2020 Cycle 4 irinotecan/Panitumumab 07/30/2020 Cycle 5 irinotecan/Panitumumab 08/13/2020, Emend added Cycle 6 irinotecan/Panitumumab 08/27/2020 CT chest 09/08/2020-decreased size of pulmonary nodules, no progressive disease Cycle 7 irinotecan/panitumumab 09/02/2020 Cycle 8 irinotecan/panitumumab 09/24/2020 Cycle 9 irinotecan/Panitumumab 10/08/2020 10/22/2020 treatment held due to left foot fracture, need for surgery Cycle 10 irinotecan/panitumumab 11/24/2020 Cycle 11 irinotecan/Panitumumab 12/08/2020 Cycle 12 irinotecan/panitumumab 12/22/2020 Cycle 13  irinotecan/panitumumab 01/05/2021 01/20/2021 CT chest-mixed response with minimal increase in size of some lesions and minimal decrease in the size of other lesions.  Overall number of lesions is unchanged. Cycle 14 irinotecan/Panitumumab 02/05/2021 Cycle 15 irinotecan/Panitumumab 02/23/2021 Cycle 16 irinotecan/panitumumab 03/16/2021 Cycle 17 irinotecan/Panitumumab 04/13/2021 05/03/2021-CT chest-enlargement of pulmonary metastases, no new lesions 06/21/2021-cycle 1 Lonsurf/bevacizumab   2.   Rectal bleeding and constipation secondary to #1   3.   History of peripheral vascular disease, status post left lower extremity vascular bypass surgery in April 2017   4.   History of nephrolithiasis   5.   History of Graves' disease treated with radioactive iodine   6.   Anxiety/depression   7.   Hypertension   8.   Hospitalization 01/19/2016 with wound dehiscence status post secondary suture closure of abdominal wall   9.   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 Hyperglycemia 06/20/2016-diagnosed with diabetes, maintained on insulin   11.  Positive COVID test 12/13/2018; positive COVID test 01/25/2021   12. Hospitalized with seizure activity/DKA. Now on Keppra, insulin adjusted. 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  Left foot fracture-surgical repair 11/11/2020     Disposition: Mr. Kedzierski appears stable.  He tolerated the first cycle of Lonsurf well.  We will follow-up on the CBC from today.  He will complete day 15 bevacizumab today.  He will return for an office visit and bevacizumab in 2 weeks.  Betsy Coder, MD  07/06/2021  11:47 AM

## 2021-07-06 NOTE — Progress Notes (Signed)
Patient presents for treatment. RN assessment completed along with the following:  Labs/vitals reviewed - Yes, and Per Dr. Benay Spice, ok to proceed with treatment with pending labs.      Weight within 10% of previous measurement - Yes Informed consent completed and reflects current therapy/intent - Yes, on date 06/18/20             Provider progress note reviewed - Yes, today's provider note was reviewed. Treatment/Antibody/Supportive plan reviewed - Yes, and there are no adjustments needed for today's treatment. S&H and other orders reviewed - Yes, and there are no additional orders identified. Previous treatment date reviewed - Yes, and the appropriate amount of time has elapsed between treatments. Clinic Hand Off Received from - Merceda Elks, RN  Patient to proceed with treatment.

## 2021-07-06 NOTE — Patient Instructions (Signed)
Jeff [name]  Discharge Instructions: Thank you for choosing [facility] to provide your oncology and hematology care.   If you have a lab appointment with the [facility], please go directly to the [facility] and check in at the registration area.   Wear comfortable clothing and clothing appropriate for easy access to any Portacath or PICC line.   We strive to give you quality time with your provider. You may need to reschedule your appointment if you arrive late (15 or more minutes).  Arriving late affects you and other patients whose appointments are after yours.  Also, if you miss three or more appointments without notifying the office, you may be dismissed from the clinic at the providers discretion.      For prescription refill requests, have your pharmacy contact our office and allow 72 hours for refills to be completed.    Today you received the following chemotherapy and/or immunotherapy agents: Bevacizumab  Bevacizumab injection What is this medication? BEVACIZUMAB (be va SIZ yoo mab) is a monoclonal antibody. It is used to treat many types of cancer. This medicine may be used for other purposes; ask your health care provider or pharmacist if you have questions. COMMON BRAND NAME(S): Alymsys, Avastin, MVASI, [name] What should I tell my care team before I take this medication? They need to know if you have any of these conditions: diabetes heart disease high blood pressure history of coughing up blood prior anthracycline chemotherapy (e.g., doxorubicin, daunorubicin, epirubicin) recent or ongoing radiation therapy recent or planning to have surgery stroke an unusual or allergic reaction to bevacizumab, hamster proteins, mouse proteins, other medicines, foods, dyes, or preservatives pregnant or trying to get pregnant breast-feeding How should I use this medication? This medicine is for infusion into a vein. It is given by a  health care professional in a hospital or clinic setting. Talk to your pediatrician regarding the use of this medicine in children. Special care may be needed. Overdosage: If you think you have taken too much of this medicine contact a poison control center or emergency room at once. NOTE: This medicine is only for you. Do not share this medicine with others. What if I miss a dose? It is important not to miss your dose. Call your doctor or health care professional if you are unable to keep an appointment. What may interact with this medication? Interactions are not expected. This list may not describe all possible interactions. Give your health care provider a list of all the medicines, herbs, non-prescription drugs, or dietary supplements you use. Also tell them if you smoke, drink alcohol, or use illegal drugs. Some items may interact with your medicine. What should I watch for while using this medication? Your condition will be monitored carefully while you are receiving this medicine. You will need important blood work and urine testing done while you are taking this medicine. This medicine may increase your risk to bruise or bleed. Call your doctor or health care professional if you notice any unusual bleeding. Before having surgery, talk to your health care provider to make sure it is ok. This drug can increase the risk of poor healing of your surgical site or wound. You will need to stop this drug for 28 days before surgery. After surgery, wait at least 28 days before restarting this drug. Make sure the surgical site or wound is healed enough before restarting this drug. Talk to your health care provider if questions. Do  not become pregnant while taking this medicine or for 6 months after stopping it. Women should inform their doctor if they wish to become pregnant or think they might be pregnant. There is a potential for serious side effects to an unborn child. Talk to your health care  professional or pharmacist for more information. Do not breast-feed an infant while taking this medicine and for 6 months after the last dose. This medicine has caused ovarian failure in some women. This medicine may interfere with the ability to have a child. You should talk to your doctor or health care professional if you are concerned about your fertility. What side effects may I notice from receiving this medication? Side effects that you should report to your doctor or health care professional as soon as possible: allergic reactions like skin rash, itching or hives, swelling of the face, lips, or tongue chest pain or chest tightness chills coughing up blood high fever seizures severe constipation signs and symptoms of bleeding such as bloody or black, tarry stools; red or dark-brown urine; spitting up blood or brown material that looks like coffee grounds; red spots on the skin; unusual bruising or bleeding from the eye, gums, or nose signs and symptoms of a blood clot such as breathing problems; chest pain; severe, sudden headache; pain, swelling, warmth in the leg signs and symptoms of a stroke like changes in vision; confusion; trouble speaking or understanding; severe headaches; sudden numbness or weakness of the face, arm or leg; trouble walking; dizziness; loss of balance or coordination stomach pain sweating swelling of legs or ankles vomiting weight gain Side effects that usually do not require medical attention (report to your doctor or health care professional if they continue or are bothersome): back pain changes in taste decreased appetite dry skin nausea tiredness This list may not describe all possible side effects. Call your doctor for medical advice about side effects. You may report side effects to FDA at 1-800-FDA-1088. Where should I keep my medication? This drug is given in a hospital or clinic and will not be stored at home. NOTE: This sheet is a summary. It  may not cover all possible information. If you have questions about this medicine, talk to your doctor, pharmacist, or health care provider.  2022 Elsevier/Gold Standard (2021-01-19 00:00:00)       To help prevent nausea and vomiting after your treatment, we encourage you to take your nausea medication as directed.  BELOW ARE SYMPTOMS THAT SHOULD BE REPORTED IMMEDIATELY: *FEVER GREATER THAN 100.4 F (38 C) OR HIGHER *CHILLS OR SWEATING *NAUSEA AND VOMITING THAT IS NOT CONTROLLED WITH YOUR NAUSEA MEDICATION *UNUSUAL SHORTNESS OF BREATH *UNUSUAL BRUISING OR BLEEDING *URINARY PROBLEMS (pain or burning when urinating, or frequent urination) *BOWEL PROBLEMS (unusual diarrhea, constipation, pain near the anus) TENDERNESS IN MOUTH AND THROAT WITH OR WITHOUT PRESENCE OF ULCERS (sore throat, sores in mouth, or a toothache) UNUSUAL RASH, SWELLING OR PAIN  UNUSUAL VAGINAL DISCHARGE OR ITCHING   Items with * indicate a potential emergency and should be followed up as soon as possible or go to the Emergency Department if any problems should occur.  Please show the CHEMOTHERAPY ALERT CARD or IMMUNOTHERAPY ALERT CARD at check-in to the Emergency Department and triage nurse.  Should you have questions after your visit or need to cancel or reschedule your appointment, please contact [facility]  Dept: [phone]  and follow the prompts.  Office hours are 8:00 a.m. to 4:30 p.m. Monday -  Friday. Please note that voicemails left after 4:00 p.m. may not be returned until the following business day.  We are closed weekends and major holidays. You have access to a nurse at all times for urgent questions. Please call the main number to the clinic Dept: [phone] and follow the prompts.   For any non-urgent questions, you may also contact your provider using MyChart. We now offer e-Visits for anyone 8 and older to request care online for non-urgent symptoms. For details visit  [email or web address].   Also download the MyChart app! Go to the app store, search "MyChart", open the app, select [facility], and log in with your MyChart username and password.  Due to Covid, a mask is required upon entering the hospital/clinic. If you do not have a mask, one will be given to you upon arrival. For doctor visits, patients may have 1 support person aged 64 or older with them. For treatment visits, patients cannot have anyone with them due to current Covid guidelines and our immunocompromised population.

## 2021-07-06 NOTE — Patient Instructions (Signed)

## 2021-07-06 NOTE — Progress Notes (Signed)
Patient seen by Dr. Benay Spice today  Vitals are within treatment parameters.  Labs reviewed by  Dr. Benay Spice: None  Per physician team, patient is ready for treatment and there are NO modifications to the treatment plan.   Labs have not resulted. Per Dr. Benay Spice, [place] to give bevacizumab without lab results.

## 2021-07-08 ENCOUNTER — Other Ambulatory Visit: Payer: Self-pay | Admitting: Oncology

## 2021-07-09 ENCOUNTER — Encounter: Payer: Self-pay | Admitting: Oncology

## 2021-07-09 ENCOUNTER — Other Ambulatory Visit (HOSPITAL_COMMUNITY): Payer: Self-pay

## 2021-07-14 ENCOUNTER — Other Ambulatory Visit (HOSPITAL_COMMUNITY): Payer: Self-pay

## 2021-07-14 ENCOUNTER — Other Ambulatory Visit: Payer: Self-pay

## 2021-07-14 ENCOUNTER — Telehealth: Payer: Self-pay | Admitting: Pharmacy Technician

## 2021-07-14 NOTE — Telephone Encounter (Signed)
Oral Oncology Patient Advocate Encounter ?  ?Received notification from [employer] that prior authorization for Lonsurf is required. ?  ?PA submitted on CoverMyMeds ?Key [id number] ?Status is pending ?  ?Oral Oncology Clinic will continue to follow. ? ?Dennison Nancy CPHT ?Specialty Pharmacy Patient Advocate ?[place] ?Phone [phone] ?Fax [phone] ?07/14/2021 2:29 PM ? ? ? ?

## 2021-07-14 NOTE — Telephone Encounter (Signed)
Oral Oncology Patient Advocate Encounter ? ?Prior Authorization for Jeff [name] has been approved.   ? ?PA# [id number] ?Effective dates: 07/14/21 through 07/15/22 ? ?Oral Oncology Clinic will continue to follow.  ? ?Dennison Nancy CPHT ?Specialty Pharmacy Patient Advocate ?[place] ?Phone [phone] ?Fax [phone] ?07/14/2021 2:31 PM ? ?

## 2021-07-19 ENCOUNTER — Inpatient Hospital Stay: Payer: Medicaid Other

## 2021-07-19 ENCOUNTER — Encounter: Payer: Self-pay | Admitting: Nurse Practitioner

## 2021-07-19 ENCOUNTER — Other Ambulatory Visit: Payer: Self-pay | Admitting: Oncology

## 2021-07-19 ENCOUNTER — Inpatient Hospital Stay (HOSPITAL_BASED_OUTPATIENT_CLINIC_OR_DEPARTMENT_OTHER): Payer: Medicaid Other | Admitting: Nurse Practitioner

## 2021-07-19 ENCOUNTER — Other Ambulatory Visit: Payer: Self-pay

## 2021-07-19 ENCOUNTER — Inpatient Hospital Stay: Payer: Medicaid Other | Attending: Oncology

## 2021-07-19 VITALS — BP 152/104 | HR 108 | Resp 20

## 2021-07-19 VITALS — BP 150/90 | HR 116 | Temp 97.8°F | Resp 18 | Ht 70.0 in | Wt 231.0 lb

## 2021-07-19 DIAGNOSIS — C187 Malignant neoplasm of sigmoid colon: Secondary | ICD-10-CM | POA: Diagnosis present

## 2021-07-19 DIAGNOSIS — C787 Secondary malignant neoplasm of liver and intrahepatic bile duct: Secondary | ICD-10-CM | POA: Insufficient documentation

## 2021-07-19 DIAGNOSIS — C189 Malignant neoplasm of colon, unspecified: Secondary | ICD-10-CM | POA: Diagnosis not present

## 2021-07-19 DIAGNOSIS — C7801 Secondary malignant neoplasm of right lung: Secondary | ICD-10-CM | POA: Diagnosis not present

## 2021-07-19 DIAGNOSIS — Z5112 Encounter for antineoplastic immunotherapy: Secondary | ICD-10-CM | POA: Diagnosis not present

## 2021-07-19 DIAGNOSIS — C7802 Secondary malignant neoplasm of left lung: Secondary | ICD-10-CM | POA: Insufficient documentation

## 2021-07-19 LAB — CBC WITH DIFFERENTIAL (CANCER CENTER ONLY)
Abs Immature Granulocytes: 0.01 10*3/uL (ref 0.00–0.07)
Basophils Absolute: 0 10*3/uL (ref 0.0–0.1)
Basophils Relative: 0 %
Eosinophils Absolute: 0.2 10*3/uL (ref 0.0–0.5)
Eosinophils Relative: 3 %
HCT: 44.4 % (ref 39.0–52.0)
Hemoglobin: 13.3 g/dL (ref 13.0–17.0)
Immature Granulocytes: 0 %
Lymphocytes Relative: 44 %
Lymphs Abs: 2.8 10*3/uL (ref 0.7–4.0)
MCH: 24.2 pg — ABNORMAL LOW (ref 26.0–34.0)
MCHC: 30 g/dL (ref 30.0–36.0)
MCV: 80.7 fL (ref 80.0–100.0)
Monocytes Absolute: 0.6 10*3/uL (ref 0.1–1.0)
Monocytes Relative: 9 %
Neutro Abs: 2.8 10*3/uL (ref 1.7–7.7)
Neutrophils Relative %: 44 %
Platelet Count: 186 10*3/uL (ref 150–400)
RBC: 5.5 MIL/uL (ref 4.22–5.81)
RDW: 18.3 % — ABNORMAL HIGH (ref 11.5–15.5)
WBC Count: 6.3 10*3/uL (ref 4.0–10.5)
nRBC: 0 % (ref 0.0–0.2)

## 2021-07-19 LAB — CMP (CANCER CENTER ONLY)
ALT: 71 U/L — ABNORMAL HIGH (ref 0–44)
AST: 43 U/L — ABNORMAL HIGH (ref 15–41)
Albumin: 4.3 g/dL (ref 3.5–5.0)
Alkaline Phosphatase: 112 U/L (ref 38–126)
Anion gap: 13 (ref 5–15)
BUN: 7 mg/dL (ref 6–20)
CO2: 22 mmol/L (ref 22–32)
Calcium: 9.2 mg/dL (ref 8.9–10.3)
Chloride: 98 mmol/L (ref 98–111)
Creatinine: 0.96 mg/dL (ref 0.61–1.24)
GFR, Estimated: >60 mL/min (ref >60)
Glucose, Bld: 455 mg/dL — ABNORMAL HIGH (ref 70–99)
Potassium: 3.5 mmol/L (ref 3.5–5.1)
Sodium: 133 mmol/L — ABNORMAL LOW (ref 135–145)
Total Bilirubin: 0.5 mg/dL (ref 0.3–1.2)
Total Protein: 7.6 g/dL (ref 6.5–8.1)

## 2021-07-19 LAB — TOTAL PROTEIN, URINE DIPSTICK: Protein, ur: NEGATIVE mg/dL

## 2021-07-19 MED ORDER — SODIUM CHLORIDE 0.9 % IV SOLN
5.0000 mg/kg | Freq: Once | INTRAVENOUS | Status: AC
  Administered 2021-07-19: 500 mg via INTRAVENOUS
  Filled 2021-07-19: qty 16

## 2021-07-19 MED ORDER — SODIUM CHLORIDE 0.9 % IV SOLN: Freq: Once | INTRAVENOUS | Status: AC

## 2021-07-19 MED ORDER — SODIUM CHLORIDE 0.9% FLUSH
10.0000 mL | INTRAVENOUS | Status: DC | PRN
  Administered 2021-07-19: 10 mL

## 2021-07-19 MED ORDER — HEPARIN SOD (PORK) LOCK FLUSH 100 UNIT/ML IV SOLN
500.0000 [IU] | Freq: Once | INTRAVENOUS | Status: AC | PRN
  Administered 2021-07-19: 500 [IU]

## 2021-07-19 NOTE — Progress Notes (Signed)
Patient presents for treatment. RN assessment completed along with the following: ? ?Labs/vitals reviewed - Yes, and okay to proceed with treatment today per Lattie Haw, NP    ?Weight within 10% of previous measurement - Yes ?Informed consent completed and reflects current therapy/intent - Yes, on date 09/09/2019             ?Provider progress note reviewed - Today's provider note is not yet available. I reviewed the most recent oncology provider progress note in chart dated 07/06/2021. ?Treatment/Antibody/Supportive plan reviewed - Yes, and there are no adjustments needed for today's treatment. ?S&H and other orders reviewed - Yes, and there are no additional orders identified. ?Previous treatment date reviewed - Yes, and the appropriate amount of time has elapsed between treatments. ?Clinic Hand Off Received from - Yes from Lattie Haw, NP ? ?Patient to proceed with treatment.  ? ?

## 2021-07-19 NOTE — Progress Notes (Signed)
?Jeff [name] ?OFFICE PROGRESS NOTE ? ? ?Diagnosis: Colon cancer ? ?INTERVAL HISTORY:  ? ?Jeff [name] returns as scheduled.  He completed a cycle of Avastin 07/06/2021.  He denies bleeding.  No nausea or vomiting.  No mouth sores.  No diarrhea.  No rash.  Blood sugars have mainly been in the 100 range. ?Objective: ? ?Vital signs in last 24 hours: ? ?Blood pressure (!) 150/90, pulse (!) 116, temperature 97.8 ?F (36.6 ?C), temperature source Oral, resp. rate 18, height $RemoveBe'5\' 10"'NerhEafFw$  (1.778 m), weight 231 lb (104.8 kg), SpO2 100 %. ?  ? ?HEENT: No thrush or ulcers. ?Resp: Lungs clear bilaterally. ?Cardio: Regular rate and rhythm. ?GI: Abdomen soft and nontender.  No hepatomegaly. ?Vascular: No leg edema. ?Port-A-Cath without erythema. ? ?Lab Results: ? ?Lab Results  ?Component Value Date  ? WBC 6.3 07/19/2021  ? HGB 13.3 07/19/2021  ? HCT 44.4 07/19/2021  ? MCV 80.7 07/19/2021  ? PLT 186 07/19/2021  ? NEUTROABS 2.8 07/19/2021  ? ? ?Imaging: ? ?No results found. ? ?Medications: I have reviewed the patient's current medications. ? 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, biopsy confirmed adenocarcinoma         ?CTs chest, abdomen, and pelvis on 12/11/2015-indeterminate tiny pulmonary nodules, multiple liver metastases, small nodes in the sigmoid mesocolon ?Laparoscopic sigmoid colectomy, liver biopsy, Port-A-Cath placement 01/14/2016 ?Pathology sigmoid colon resection- colonic adenocarcinoma, 5.3 cm extending into pericolonic connective tissue, positive lymph-vascular invasion, positive perineural invasion, negative margins, metastatic carcinoma in 9 of 28 lymph nodes ?Pathology liver biopsy-metastatic colorectal adenocarcinoma ?MSI stable; mismatch repair protein normal ?APC alteration, K RAS wild-type, 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 Stable right adrenal nodule. No adenopathy or local recurrence near the rectosigmoid anastomotic site. ?Cycle 7 FOLFOX 04/25/2016 ?MRI liver 05/02/2016-partial improvement in hepatic metastases ?Cycle 8 FOLFOX 05/10/2016 ?Cycle 9 FOLFOX 05/23/2016 (oxaliplatin held due to thrombocytopenia) ?Cycle 10 FOLFOX 06/06/2016 (oxaliplatin dose reduced due to thrombocytopenia) ?Cycle 11 FOLFOX 06/27/2016 (oxaliplatin held due to neuropathy) ?Cycle 12 FOLFOX 07/11/2016 (oxaliplatin held) ?Initiation of maintenance Xeloda 7 days on/7 days off 07/27/2016 ?MRI liver 11/18/2016-decrease in hepatic metastatic disease. No new or progressive disease identified within the abdomen. ?Continuation of Xeloda 7 days on/7 days off ?MRI liver 04/27/2017-previous liver lesions not identified, no new lesions, no lymphadenopathy ?Xeloda continued 7 days on/7 days off ?MRI liver 12/04/2017 - no evidence of metastatic disease, hepatic steatosis ?Xeloda continued 7 days on/7 days off ?MRI liver 07/15/2018- no evidence of metastatic disease.  Stable severe hepatic steatosis. ?Xeloda continued 7 days on/7 days off ?MRI liver 03/16/2019-hepatic steatosis, no liver mass, focal area of intrahepatic biliary dilatation in segments 2 and 3 of the left lobe-increased ?Xeloda continued 7 days on/7 days off ?MRI abdomen 08/19/2019-no findings to suggest liver metastases.  Bilateral lung nodules measuring up to 2.8 cm, progressive and more conspicuous than on previous exam ?CT chest 08/29/2019-multiple pulmonary metastases, new from 04/21/2016 ?Cycle 1 FOLFIRI/bevacizumab 09/09/2019 ?Cycle 2 FOLFIRI/bevacizumab 09/26/2019  ?Cycle 3 FOLFIRI/bevacizumab 10/10/2019 ?Cycle 4 FOLFIRI/bevacizumab 10/24/2019 ?Cycle 5 FOLFIRI/bevacizumab 11/07/2019 ?CT chest 11/14/2019-decreased size of lung nodules, no new  lesions, hepatic steatosis ?Cycle 6 FOLFIRI/bevacizumab 11/21/2019 ?Cycle 7 FOLFIRI/bevacizumab 12/05/2019 ?Cycle 8 FOLFIRI/bevacizumab 12/19/2019 ?Cycle 9 FOLFIRI/bevacizumab 01/02/2020 ?Cycle 10 FOLFIRI/bevacizumab 01/16/2020 ?CT chest  01/29/2020-stable bilateral pulmonary metastases.  No new or progressive metastatic disease in the chest. ?Cycle 11 FOLFIRI/bevacizumab 01/30/2020 ?Cycle 12 FOLFIRI/bevacizumab 02/19/2020 ?Cycle 13 FOLFIRI/bevacizumab 03/12/2020 ?Cycle 14 FOLFIRI/bevacizumab 04/02/2020 ?Cycle 15 FOLFIRI/bevacizumab 04/23/2020 ?Cycle 16 FOLFIRI/bevacizumab 05/21/2020 ?CT chest 06/09/2020-mild progression pulmonary metastasis.  Some lesions have increased in size while others are similar. ?Cycle 1 irinotecan/Panitumumab 06/18/2020 ?Cycle 2 irinotecan/Panitumumab 07/02/2020 ?Cycle 3 irinotecan/panitumumab 07/16/2020 ?Cycle 4 irinotecan/Panitumumab 07/30/2020 ?Cycle 5 irinotecan/Panitumumab 08/13/2020, Emend added ?Cycle 6 irinotecan/Panitumumab 08/27/2020 ?CT chest 09/08/2020-decreased size of pulmonary nodules, no progressive disease ?Cycle 7 irinotecan/panitumumab 09/02/2020 ?Cycle 8 irinotecan/panitumumab 09/24/2020 ?Cycle 9 irinotecan/Panitumumab 10/08/2020 ?10/22/2020 treatment held due to left foot fracture, need for surgery ?Cycle 10 irinotecan/panitumumab 11/24/2020 ?Cycle 11 irinotecan/Panitumumab 12/08/2020 ?Cycle 12 irinotecan/panitumumab 12/22/2020 ?Cycle 13 irinotecan/panitumumab 01/05/2021 ?01/20/2021 CT chest-mixed response with minimal increase in size of some lesions and minimal decrease in the size of other lesions.  Overall number of lesions is unchanged. ?Cycle 14 irinotecan/Panitumumab 02/05/2021 ?Cycle 15 irinotecan/Panitumumab 02/23/2021 ?Cycle 16 irinotecan/panitumumab 03/16/2021 ?Cycle 17 irinotecan/Panitumumab 04/13/2021 ?05/03/2021-CT chest-enlargement of pulmonary metastases, no new lesions ?06/21/2021-cycle 1 Lonsurf/bevacizumab ?07/19/2021-cycle 2 Lonsurf/bevacizumab ?  ?2.   Rectal bleeding and constipation  secondary to #1 ?  ?3.   History of peripheral vascular disease, status post left lower extremity vascular bypass surgery in April 2017 ?  ?4.   History of nephrolithiasis ?  ?5.   History of Graves' disease treated with radioactive iodine ?  ?6.   Anxiety/depression ?  ?7.   Hypertension ?  ?8.   Hospitalization 01/19/2016 with wound dehiscence status post secondary suture closure of abdominal wall ?  ?9.   Thrombocytopenia secondary to chemotherapy-oxaliplatin held with cycle 6 and cycle 9 FOLFOX ?  ?10. Hyperglycemia 06/20/2016-diagnosed with diabetes, maintained on insulin ?  ?11.  Positive COVID test 12/13/2018; positive COVID test 01/25/2021 ?  ?12. Hospitalized with seizure activity/DKA. Now on Keppra, insulin adjusted. 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  Left foot fracture-surgical repair 11/11/2020 ?  ?  ? ?Disposition: Jeff [name] appears stable.  He has completed 1 cycle of Lonsurf/bevacizumab.  Plan to proceed with cycle 2 today as scheduled. ? ?CBC and chemistry panel reviewed.  Labs adequate to proceed with treatment.  Blood sugar returned at 455.  He will monitor closely over the next several days.  His wife plans to notify PCP with persistent elevated readings. ? ?He will return for lab and Avastin in 2 weeks.  We will see him in follow-up in 4 weeks.  He will contact the office in the interim with any problems. ? ? ? ?Ned Card ANP/GNP-BC  ? ?07/19/2021  ?1:00 PM ? ? ? ? ? ? ? ?

## 2021-07-19 NOTE — Patient Instructions (Signed)
[name]   Discharge Instructions: Thank you for choosing [facility] to provide your oncology and hematology care.   If you have a lab appointment with the [facility], please go directly to the [facility] and check in at the registration area.   Wear comfortable clothing and clothing appropriate for easy access to any Portacath or PICC line.   We strive to give you quality time with your provider. You may need to reschedule your appointment if you arrive late (15 or more minutes).  Arriving late affects you and other patients whose appointments are after yours.  Also, if you miss three or more appointments without notifying the office, you may be dismissed from the clinic at the providers discretion.      For prescription refill requests, have your pharmacy contact our office and allow 72 hours for refills to be completed.    Today you received the following chemotherapy and/or immunotherapy agents Bevacizumab-bvzr (ZIRABEV).      To help prevent nausea and vomiting after your treatment, we encourage you to take your nausea medication as directed.  BELOW ARE SYMPTOMS THAT SHOULD BE REPORTED IMMEDIATELY: *FEVER GREATER THAN 100.4 F (38 C) OR HIGHER *CHILLS OR SWEATING *NAUSEA AND VOMITING THAT IS NOT CONTROLLED WITH YOUR NAUSEA MEDICATION *UNUSUAL SHORTNESS OF BREATH *UNUSUAL BRUISING OR BLEEDING *URINARY PROBLEMS (pain or burning when urinating, or frequent urination) *BOWEL PROBLEMS (unusual diarrhea, constipation, pain near the anus) TENDERNESS IN MOUTH AND THROAT WITH OR WITHOUT PRESENCE OF ULCERS (sore throat, sores in mouth, or a toothache) UNUSUAL RASH, SWELLING OR PAIN  UNUSUAL VAGINAL DISCHARGE OR ITCHING   Items with * indicate a potential emergency and should be followed up as soon as possible or go to the Emergency Department if any problems should occur.  Please show the CHEMOTHERAPY ALERT CARD or IMMUNOTHERAPY ALERT CARD  at check-in to the Emergency Department and triage nurse.  Should you have questions after your visit or need to cancel or reschedule your appointment, please contact [facility]  Dept: [phone]  and follow the prompts.  Office hours are 8:00 a.m. to 4:30 p.m. Monday - Friday. Please note that voicemails left after 4:00 p.m. may not be returned until the following business day.  We are closed weekends and major holidays. You have access to a nurse at all times for urgent questions. Please call the main number to the clinic Dept: [phone] and follow the prompts.   For any non-urgent questions, you may also contact your provider using MyChart. We now offer e-Visits for anyone 55 and older to request care online for non-urgent symptoms. For details visit [email or web address].   Also download the MyChart app! Go to the app store, search "MyChart", open the app, select [facility], and log in with your MyChart username and password.  Due to Covid, a mask is required upon entering the hospital/clinic. If you do not have a mask, one will be given to you upon arrival. For doctor visits, patients may have 1 support person aged 55 or older with them. For treatment visits, patients cannot have anyone with them due to current Covid guidelines and our immunocompromised population.   Bevacizumab injection What is this medication? BEVACIZUMAB (be va SIZ yoo mab) is a monoclonal antibody. It is used to treat many types of cancer. This medicine may be used for other purposes; ask your health care provider or pharmacist if you have questions. COMMON BRAND NAME(S):  Alymsys, Avastin, MVASI, [name] What should I tell my care team before I take this medication? They need to know if you have any of these conditions: diabetes heart disease high blood pressure history of coughing up blood prior anthracycline chemotherapy (e.g., doxorubicin, daunorubicin, epirubicin) recent or  ongoing radiation therapy recent or planning to have surgery stroke an unusual or allergic reaction to bevacizumab, hamster proteins, mouse proteins, other medicines, foods, dyes, or preservatives pregnant or trying to get pregnant breast-feeding How should I use this medication? This medicine is for infusion into a vein. It is given by a health care professional in a hospital or clinic setting. Talk to your pediatrician regarding the use of this medicine in children. Special care may be needed. Overdosage: If you think you have taken too much of this medicine contact a poison control center or emergency room at once. NOTE: This medicine is only for you. Do not share this medicine with others. What if I miss a dose? It is important not to miss your dose. Call your doctor or health care professional if you are unable to keep an appointment. What may interact with this medication? Interactions are not expected. This list may not describe all possible interactions. Give your health care provider a list of all the medicines, herbs, non-prescription drugs, or dietary supplements you use. Also tell them if you smoke, drink alcohol, or use illegal drugs. Some items may interact with your medicine. What should I watch for while using this medication? Your condition will be monitored carefully while you are receiving this medicine. You will need important blood work and urine testing done while you are taking this medicine. This medicine may increase your risk to bruise or bleed. Call your doctor or health care professional if you notice any unusual bleeding. Before having surgery, talk to your health care provider to make sure it is ok. This drug can increase the risk of poor healing of your surgical site or wound. You will need to stop this drug for 28 days before surgery. After surgery, wait at least 28 days before restarting this drug. Make sure the surgical site or wound is healed enough before  restarting this drug. Talk to your health care provider if questions. Do not become pregnant while taking this medicine or for 6 months after stopping it. Women should inform their doctor if they wish to become pregnant or think they might be pregnant. There is a potential for serious side effects to an unborn child. Talk to your health care professional or pharmacist for more information. Do not breast-feed an infant while taking this medicine and for 6 months after the last dose. This medicine has caused ovarian failure in some women. This medicine may interfere with the ability to have a child. You should talk to your doctor or health care professional if you are concerned about your fertility. What side effects may I notice from receiving this medication? Side effects that you should report to your doctor or health care professional as soon as possible: allergic reactions like skin rash, itching or hives, swelling of the face, lips, or tongue chest pain or chest tightness chills coughing up blood high fever seizures severe constipation signs and symptoms of bleeding such as bloody or black, tarry stools; red or dark-brown urine; spitting up blood or brown material that looks like coffee grounds; red spots on the skin; unusual bruising or bleeding from the eye, gums, or nose signs and symptoms of a blood clot such as  breathing problems; chest pain; severe, sudden headache; pain, swelling, warmth in the leg signs and symptoms of a stroke like changes in vision; confusion; trouble speaking or understanding; severe headaches; sudden numbness or weakness of the face, arm or leg; trouble walking; dizziness; loss of balance or coordination stomach pain sweating swelling of legs or ankles vomiting weight gain Side effects that usually do not require medical attention (report to your doctor or health care professional if they continue or are bothersome): back pain changes in taste decreased  appetite dry skin nausea tiredness This list may not describe all possible side effects. Call your doctor for medical advice about side effects. You may report side effects to FDA at 1-800-FDA-1088. Where should I keep my medication? This drug is given in a hospital or clinic and will not be stored at home. NOTE: This sheet is a summary. It may not cover all possible information. If you have questions about this medicine, talk to your doctor, pharmacist, or health care provider.  2022 [employer]vier/[employer] Standard (2021-01-19 00:00:00)

## 2021-07-20 ENCOUNTER — Institutional Professional Consult (permissible substitution): Payer: Medicaid Other | Admitting: Pulmonary Disease

## 2021-07-21 ENCOUNTER — Other Ambulatory Visit: Payer: Self-pay

## 2021-07-26 ENCOUNTER — Other Ambulatory Visit: Payer: Self-pay

## 2021-08-01 ENCOUNTER — Other Ambulatory Visit: Payer: Self-pay | Admitting: Oncology

## 2021-08-02 ENCOUNTER — Inpatient Hospital Stay: Payer: Medicaid Other

## 2021-08-02 ENCOUNTER — Other Ambulatory Visit: Payer: Self-pay

## 2021-08-02 VITALS — BP 171/96 | HR 86 | Temp 97.8°F | Resp 18 | Ht 70.0 in | Wt 231.0 lb

## 2021-08-02 DIAGNOSIS — Z5112 Encounter for antineoplastic immunotherapy: Secondary | ICD-10-CM | POA: Diagnosis not present

## 2021-08-02 DIAGNOSIS — C787 Secondary malignant neoplasm of liver and intrahepatic bile duct: Secondary | ICD-10-CM

## 2021-08-02 DIAGNOSIS — C187 Malignant neoplasm of sigmoid colon: Secondary | ICD-10-CM

## 2021-08-02 LAB — CBC WITH DIFFERENTIAL (CANCER CENTER ONLY)
Abs Immature Granulocytes: 0.02 10*3/uL (ref 0.00–0.07)
Basophils Absolute: 0 10*3/uL (ref 0.0–0.1)
Basophils Relative: 0 %
Eosinophils Absolute: 0.1 10*3/uL (ref 0.0–0.5)
Eosinophils Relative: 2 %
HCT: 41.5 % (ref 39.0–52.0)
Hemoglobin: 12.3 g/dL — ABNORMAL LOW (ref 13.0–17.0)
Immature Granulocytes: 0 %
Lymphocytes Relative: 37 %
Lymphs Abs: 2.7 10*3/uL (ref 0.7–4.0)
MCH: 24.2 pg — ABNORMAL LOW (ref 26.0–34.0)
MCHC: 29.6 g/dL — ABNORMAL LOW (ref 30.0–36.0)
MCV: 81.7 fL (ref 80.0–100.0)
Monocytes Absolute: 0.4 10*3/uL (ref 0.1–1.0)
Monocytes Relative: 5 %
Neutro Abs: 4 10*3/uL (ref 1.7–7.7)
Neutrophils Relative %: 56 %
Platelet Count: 169 10*3/uL (ref 150–400)
RBC: 5.08 MIL/uL (ref 4.22–5.81)
RDW: 18.7 % — ABNORMAL HIGH (ref 11.5–15.5)
WBC Count: 7.3 10*3/uL (ref 4.0–10.5)
nRBC: 0.4 % — ABNORMAL HIGH (ref 0.0–0.2)

## 2021-08-02 LAB — CMP (CANCER CENTER ONLY)
ALT: 35 U/L (ref 0–44)
AST: 32 U/L (ref 15–41)
Albumin: 4.1 g/dL (ref 3.5–5.0)
Alkaline Phosphatase: 113 U/L (ref 38–126)
Anion gap: 11 (ref 5–15)
BUN: 8 mg/dL (ref 6–20)
CO2: 26 mmol/L (ref 22–32)
Calcium: 9.3 mg/dL (ref 8.9–10.3)
Chloride: 101 mmol/L (ref 98–111)
Creatinine: 0.94 mg/dL (ref 0.61–1.24)
GFR, Estimated: >60 mL/min (ref >60)
Glucose, Bld: 354 mg/dL — ABNORMAL HIGH (ref 70–99)
Potassium: 3.9 mmol/L (ref 3.5–5.1)
Sodium: 138 mmol/L (ref 135–145)
Total Bilirubin: 0.6 mg/dL (ref 0.3–1.2)
Total Protein: 7.4 g/dL (ref 6.5–8.1)

## 2021-08-02 MED ORDER — SODIUM CHLORIDE 0.9 % IV SOLN
5.0000 mg/kg | Freq: Once | INTRAVENOUS | Status: AC
  Administered 2021-08-02: 500 mg via INTRAVENOUS
  Filled 2021-08-02: qty 4

## 2021-08-02 MED ORDER — SODIUM CHLORIDE 0.9% FLUSH
10.0000 mL | INTRAVENOUS | Status: DC | PRN
  Administered 2021-08-02: 10 mL

## 2021-08-02 MED ORDER — HEPARIN SOD (PORK) LOCK FLUSH 100 UNIT/ML IV SOLN
500.0000 [IU] | Freq: Once | INTRAVENOUS | Status: AC | PRN
  Administered 2021-08-02: 500 [IU]

## 2021-08-02 MED ORDER — SODIUM CHLORIDE 0.9 % IV SOLN: Freq: Once | INTRAVENOUS | Status: AC

## 2021-08-02 NOTE — Patient Instructions (Signed)
[facility]   ?Discharge Instructions: ?Thank you for choosing [facility] to provide your oncology and hematology care.  ? ?If you have a lab appointment with the [facility], please go directly to the [facility] and check in at the registration area. ?  ?Wear comfortable clothing and clothing appropriate for easy access to any Portacath or PICC line.  ? ?We strive to give you quality time with your provider. You may need to reschedule your appointment if you arrive late (15 or more minutes).  Arriving late affects you and other patients whose appointments are after yours.  Also, if you miss three or more appointments without notifying the office, you may be dismissed from the clinic at the provider?s discretion.    ?  ?For prescription refill requests, have your pharmacy contact our office and allow 72 hours for refills to be completed.   ? ?Today you received the following chemotherapy and/or immunotherapy agents Bevacizumab-bvzr (ZIRABEV).    ?  ?To help prevent nausea and vomiting after your treatment, we encourage you to take your nausea medication as directed. ? ?BELOW ARE SYMPTOMS THAT SHOULD BE REPORTED IMMEDIATELY: ?*FEVER GREATER THAN 100.4 F (38 ?C) OR HIGHER ?*CHILLS OR SWEATING ?*NAUSEA AND VOMITING THAT IS NOT CONTROLLED WITH YOUR NAUSEA MEDICATION ?*UNUSUAL SHORTNESS OF BREATH ?*UNUSUAL BRUISING OR BLEEDING ?*URINARY PROBLEMS (pain or burning when urinating, or frequent urination) ?*BOWEL PROBLEMS (unusual diarrhea, constipation, pain near the anus) ?TENDERNESS IN MOUTH AND THROAT WITH OR WITHOUT PRESENCE OF ULCERS (sore throat, sores in mouth, or a toothache) ?UNUSUAL RASH, SWELLING OR PAIN  ?UNUSUAL VAGINAL DISCHARGE OR ITCHING  ? ?Items with * indicate a potential emergency and should be followed up as soon as possible or go to the Emergency Department if any problems should occur. ? ?Please show the CHEMOTHERAPY ALERT CARD or IMMUNOTHERAPY ALERT CARD  at check-in to the Emergency Department and triage nurse. ? ?Should you have questions after your visit or need to cancel or reschedule your appointment, please contact [facility]  Dept: [phone]  and follow the prompts.  Office hours are 8:00 a.m. to 4:30 p.m. Monday - Friday. Please note that voicemails left after 4:00 p.m. may not be returned until the following business day.  We are closed weekends and major holidays. You have access to a nurse at all times for urgent questions. Please call the main number to the clinic Dept: [phone] and follow the prompts. ? ? ?For any non-urgent questions, you may also contact your provider using MyChart. We now offer e-Visits for anyone 73 and older to request care online for non-urgent symptoms. For details visit [email or web address]. ?  ?Also download the MyChart app! Go to the app store, search "MyChart", open the app, select [facility], and log in with your MyChart username and password. ? ?Due to Covid, a mask is required upon entering the hospital/clinic. If you do not have a mask, one will be given to you upon arrival. For doctor visits, patients may have 1 support person aged 39 or older with them. For treatment visits, patients cannot have anyone with them due to current Covid guidelines and our immunocompromised population.  ? ?Bevacizumab injection ?What is this medication? ?BEVACIZUMAB (be va SIZ yoo mab) is a monoclonal antibody. It is used to treat many types of cancer. ?This medicine may be used for other purposes; ask your health care provider or pharmacist if you have questions. ?COMMON BRAND NAME(S):  Alymsys, Avastin, MVASI, Zirabev ?What should I tell my care team before I take this medication? ?They need to know if you have any of these conditions: ?diabetes ?heart disease ?high blood pressure ?history of coughing up blood ?prior anthracycline chemotherapy (e.g., doxorubicin, daunorubicin, epirubicin) ?recent or  ongoing radiation therapy ?recent or planning to have surgery ?stroke ?an unusual or allergic reaction to bevacizumab, hamster proteins, mouse proteins, other medicines, foods, dyes, or preservatives ?pregnant or trying to get pregnant ?breast-feeding ?How should I use this medication? ?This medicine is for infusion into a vein. It is given by a health care professional in a hospital or clinic setting. ?Talk to your pediatrician regarding the use of this medicine in children. Special care may be needed. ?Overdosage: If you think you have taken too much of this medicine contact a poison control center or emergency room at once. ?NOTE: This medicine is only for you. Do not share this medicine with others. ?What if I miss a dose? ?It is important not to miss your dose. Call your doctor or health care professional if you are unable to keep an appointment. ?What may interact with this medication? ?Interactions are not expected. ?This list may not describe all possible interactions. Give your health care provider a list of all the medicines, herbs, non-prescription drugs, or dietary supplements you use. Also tell them if you smoke, drink alcohol, or use illegal drugs. Some items may interact with your medicine. ?What should I watch for while using this medication? ?Your condition will be monitored carefully while you are receiving this medicine. You will need important blood work and urine testing done while you are taking this medicine. ?This medicine may increase your risk to bruise or bleed. Call your doctor or health care professional if you notice any unusual bleeding. ?Before having surgery, talk to your health care provider to make sure it is ok. This drug can increase the risk of poor healing of your surgical site or wound. You will need to stop this drug for 28 days before surgery. After surgery, wait at least 28 days before restarting this drug. Make sure the surgical site or wound is healed enough before  restarting this drug. Talk to your health care provider if questions. ?Do not become pregnant while taking this medicine or for 6 months after stopping it. Women should inform their doctor if they wish to become pregnant or think they might be pregnant. There is a potential for serious side effects to an unborn child. Talk to your health care professional or pharmacist for more information. Do not breast-feed an infant while taking this medicine and for 6 months after the last dose. ?This medicine has caused ovarian failure in some women. This medicine may interfere with the ability to have a child. You should talk to your doctor or health care professional if you are concerned about your fertility. ?What side effects may I notice from receiving this medication? ?Side effects that you should report to your doctor or health care professional as soon as possible: ?allergic reactions like skin rash, itching or hives, swelling of the face, lips, or tongue ?chest pain or chest tightness ?chills ?coughing up blood ?high fever ?seizures ?severe constipation ?signs and symptoms of bleeding such as bloody or black, tarry stools; red or dark-brown urine; spitting up blood or brown material that looks like coffee grounds; red spots on the skin; unusual bruising or bleeding from the eye, gums, or nose ?signs and symptoms of a blood clot such as  breathing problems; chest pain; severe, sudden headache; pain, swelling, warmth in the leg ?signs and symptoms of a stroke like changes in vision; confusion; trouble speaking or understanding; severe headaches; sudden numbness or weakness of the face, arm or leg; trouble walking; dizziness; loss of balance or coordination ?stomach pain ?sweating ?swelling of legs or ankles ?vomiting ?weight gain ?Side effects that usually do not require medical attention (report to your doctor or health care professional if they continue or are bothersome): ?back pain ?changes in taste ?decreased  appetite ?dry skin ?nausea ?tiredness ?This list may not describe all possible side effects. Call your doctor for medical advice about side effects. You may report side effects to FDA at 1-800-FDA-1088. ?Where shoul

## 2021-08-02 NOTE — Progress Notes (Signed)
Patient presents for treatment. RN assessment completed along with the following: ? ?Labs/vitals reviewed - Yes, and within treatment parameters.   ?Weight within 10% of previous measurement - Yes ?Informed consent completed and reflects current therapy/intent - Yes, on date 09/09/19             ?Provider progress note reviewed - Patient not seen by provider today. Most recent note dated 07/19/21 reviewed. ?Treatment/Antibody/Supportive plan reviewed - Yes, and there are no adjustments needed for today's treatment. ?S&H and other orders reviewed - Yes, and there are no additional orders identified. ?Previous treatment date reviewed - Yes, and the appropriate amount of time has elapsed between treatments. ? ?Patient to proceed with treatment.   ?

## 2021-08-02 NOTE — Patient Instructions (Signed)

## 2021-08-03 ENCOUNTER — Other Ambulatory Visit: Payer: Self-pay | Admitting: Cardiovascular Disease

## 2021-08-03 ENCOUNTER — Ambulatory Visit (INDEPENDENT_AMBULATORY_CARE_PROVIDER_SITE_OTHER): Payer: Medicaid Other | Admitting: Adult Health

## 2021-08-03 ENCOUNTER — Other Ambulatory Visit (HOSPITAL_COMMUNITY): Payer: Self-pay

## 2021-08-03 ENCOUNTER — Encounter: Payer: Self-pay | Admitting: Adult Health

## 2021-08-03 VITALS — BP 116/60 | HR 120 | Ht 70.0 in | Wt 232.6 lb

## 2021-08-03 DIAGNOSIS — G4730 Sleep apnea, unspecified: Secondary | ICD-10-CM | POA: Diagnosis not present

## 2021-08-03 DIAGNOSIS — G4733 Obstructive sleep apnea (adult) (pediatric): Secondary | ICD-10-CM

## 2021-08-03 DIAGNOSIS — R400 Somnolence: Secondary | ICD-10-CM

## 2021-08-03 NOTE — Patient Instructions (Signed)
Begin CPAP At bedtime   ?Wear all night long , for at least 6hr  ?Work on healthy weight loss.  ?Do not drive if sleepy .  ?Follow up in 2-3 months and As needed   ? ?

## 2021-08-03 NOTE — Telephone Encounter (Signed)
Refill Request.  

## 2021-08-03 NOTE — Assessment & Plan Note (Addendum)
Mild obstructive sleep apnea.  We reviewed his sleep study results. ?Patient education was given.  We went over treatment options including weight loss, oral appliance and CPAP.  Patient like to proceed with a CPAP therapy ?- discussed how weight can impact sleep and risk for sleep disordered breathing ?- discussed options to assist with weight loss: combination of diet modification, cardiovascular and strength training exercises ?  ?- had an extensive discussion regarding the adverse health consequences related to untreated sleep disordered breathing ?- specifically discussed the risks for hypertension, coronary artery disease, cardiac dysrhythmias, cerebrovascular disease, and diabetes ?- lifestyle modification discussed ?  ?- discussed how sleep disruption can increase risk of accidents, particularly when driving ?- safe driving practices were discussed ? ?Begin CPAP 5 to 15 cm H2O ?  ?Plan  ?Patient Instructions  ?Begin CPAP At bedtime   ?Wear all night long , for at least 6hr  ?Work on healthy weight loss.  ?Do not drive if sleepy .  ?Follow up in 2-3 months and As needed   ? ?  ? ?

## 2021-08-03 NOTE — Progress Notes (Signed)
? ? ? ? ? ? ? ? ? ? ? ? ? ? ? ? ? ? ? ? ? ? ? ? ? ? ? ? ? ? ? ? ? ? ? ? ? ? ? ? ? ? ? ? ? ? ? ? ? ? ? ? ? ? ? ? ? ? ? ? ? ? ? ? ? ? ? ? ? ? ? ? ? ? ? ? ? ? ?'@Patient'$  ID: Jeff [name], male    DOB: March 04, 1966, 56 y.o.   MRN: [id number] ? ?Chief Complaint  ?Patient presents with  ? Sleep Apnea  ?  Troubles staying asleep  ? Snoring  ? ? ?Referring provider: ?Cameron Sprang, MD ? ?HPI: ?56 year old male seen for sleep consult August 03, 2021 for Sleep apnea .  ?Medical history significant for metastatic colon cancer-diagnosed in 2017, insulin-dependent diabetes ? ?TEST/EVENTS :  ?Home sleep study December 29, 2020 showed mild sleep apnea with AHI at 8/hour and SPO2 low at 87%, snoring with fragmented sleep. ? ?08/03/2021 Sleep Consult  ?Patient presents for a sleep consult.  Patient recently was seen by his primary care doctor with daytime sleepiness, restless sleep, and snoring.  He was set up for home sleep study that showed mild obstructive sleep apnea.  Typically goes to bed about 8 to 10 PM.  Takes a long time to go to sleep.  Is up multiple times throughout the night.  Gets up about 9 to 10 AM.  Does not operate heavy machinery.  Weight is about the same over the last 2 years.  Epworth score is 4 out of 24.  Typically gets sleepy if he is watching TV or an active.   ?Patient was set up for home sleep study December 29, 2020 that showed mild sleep apnea with AHI at 8/hour and SPO2 low at 87%.  Notable snoring with fragmented sleep throughout study. ?Patient denies any symptoms suspicious for cataplexy or sleep paralysis. ? ? ?Social history patient is disabled.  Has 5 adult children.  He is married.  Never smoker.  Occasional social alcohol.  No drug use. ? ?Family history positive/sleep apnea ? ?Surgical history neck and back surgery in 2009.  Colon resection 2017 ? ?Medical history patient has metastatic colon cancer with mets to the lungs and liver-undergoing active treatment.,  Hypertension, Graves' disease  status post radioactive iodine treatment, hypothyroidism, insulin-dependent diabetes, hyperlipidemia, peripheral vascular disease, seizure ? ? ? ?Allergies  ?Allergen Reactions  ? Acetaminophen Hives and Other (See Comments)  ? Hydrocodone Hives  ? ? ?Immunization History  ?Administered Date(s) Administered  ? Influenza,inj,Quad PF,6+ Mos 03/11/2014, 03/05/2015, 02/07/2017, 02/06/2018, 02/19/2020, 03/16/2021  ? Influenza-Unspecified 02/14/2016, 02/14/2019  ? PFIZER(Purple Top)SARS-COV-2 Vaccination 08/17/2019, 09/11/2019, 01/02/2020  ? Pension scheme manager 24yr & up 05/25/2021  ? ? ?Past Medical History:  ?Diagnosis Date  ? Allergic rhinitis   ? Anxiety   ? Arthritis   ? Chemotherapy-induced thrombocytopenia   ? Chronic nausea   ? mild intermittant and loose stools due to colon cancer  ? Chronic neck and back pain   ? Depression   ? ED (erectile dysfunction)   ? Generalized idiopathic epilepsy and epileptic syndromes, not intractable, without status epilepticus ([facility] 10/25/2019  ? (11-10-2020 per pt wife last seizure 11/ 2021)  neurologist-- dr aDelice Lesch--  seizure activity/ DKA ;  EEG w/ evidence epileto genicity arising right frontocentral region;  taking keppra  ? GERD (gastroesophageal reflux disease)   ? Hiatal hernia   ?  History of 2019 novel coronavirus disease (COVID-19) 12/13/2018  ? per pt wife , pt had moderate symtptoms that resolved  ? History of Graves' disease   ? s/p RAI 02/ 2012;  previous seen by endocrinologist--- dr Loanne Drilling  ? History of kidney stones   ? History of non-ST elevation myocardial infarction (NSTEMI)   ? Jan 2014--  no CAD;  per notes probable coronary vasospasm  ? History of panic attacks   ? History of thyroid storm   ? Nov 2011 w/ grave's ,  s/p RAI 02/ 2012  ? Hyperlipidemia   ? Hypertension   ? Hypothyroidism following radioiodine therapy   ? RAI in 02/  2012---  previously followed by dr Loanne Drilling,  now followed by pcp  ? Lower urinary tract symptoms (LUTS)   ?  Metastatic colon cancer to liver ([place]) 11/2015  ? oncologist-- sherrill/ and seen by dr a. [name];  01-14-2016 s/p sigmoid colectomy, liver bx, node dissection, PAC;   chemo started 09/ 2017  ;  04/ 2021 metastatic pulmonary nodules;   active treatment every 2 wks  ? Nephrolithiasis   ? left side non-obstructive per imaging 04/ 2022  ? PAD (peripheral artery disease) [facility]) cardiologist-  dr Fletcher Anon  ? cardiologist ----  dr Jerilynn Mages. Fletcher Anon ;   left popliteal occlusion behind knee w/ collaterals;  s/p attempted angioplasty 01/ 2016;  09-11-2015 s/p pop-TPT w/ ipsNR GSV  ? PONV (postoperative nausea and vomiting)   ? Rash   ? due to chemo  ? Type 2 diabetes mellitus treated with insulin ([place])   ? followed by pcp----   (11-10-2020  per pt wife blood sugar check's 2-4 times daily,  fasting sugar--- 130--168)  ? Unspecified fracture of left foot, initial encounter for closed fracture 10/17/2020  ? midfoot  ? ? ?Tobacco History: ?Social History  ? ?Tobacco Use  ?Smoking Status Never  ?Smokeless Tobacco Never  ? ?Counseling given: Not Answered ? ? ?Outpatient Medications Prior to Visit  ?Medication Sig Dispense Refill  ? ACCU-CHEK GUIDE test strip CHECK BLOOD SUGAR THREE TIMES DAILY AS DIRECTED    ? ACCU-CHEK SOFTCLIX LANCETS lancets USE  TID TO CHECK BLOOD SUGAR LEVELS  0  ? amLODipine (NORVASC) 10 MG tablet Take 10 mg by mouth daily.    ? amoxicillin-clavulanate (AUGMENTIN) 875-125 MG tablet Take 1 tablet by mouth 2 (two) times daily. (Patient not taking: Reported on 07/19/2021)    ? aspirin EC 81 MG tablet Take 81 mg by mouth daily.    ? atorvastatin (LIPITOR) 80 MG tablet TAKE 1 TABLET(80 MG) BY MOUTH AT BEDTIME 60 tablet 1  ? clonazePAM (KLONOPIN) 1 MG tablet Take 1 mg by mouth 2 (two) times daily as needed.    ? dapagliflozin propanediol (FARXIGA) 10 MG TABS tablet Take 10 mg by mouth daily.    ? diphenoxylate-atropine (LOMOTIL) 2.5-0.025 MG tablet Take 2 tablets by mouth 4 (four) times daily as needed for diarrhea or loose  stools. 40 tablet 0  ? docusate sodium (COLACE) 100 MG capsule Take 100 mg by mouth 2 (two) times daily.    ? doxycycline (VIBRA-TABS) 100 MG tablet TAKE 1 TABLET(100 MG) BY MOUTH TWICE DAILY 60 tablet 3  ? fluticasone (CUTIVATE) 0.05 % cream APPLY TOPICALLY TO THE AFFECTED AREA TWICE DAILY 30 g 2  ? GLOBAL EASE INJECT PEN NEEDLES 31G X 5 MM MISC Inject 1 Syringe into the skin 4 (four) times daily.    ? hydrocortisone 2.5 % cream APPLY  TOPICALLY TO FACE RASH DAILY 28.35 g 1  ? insulin aspart (NOVOLOG) 100 UNIT/ML injection Inject 12 Units into the skin 3 (three) times daily before meals. (Patient taking differently: Inject 18 Units into the skin 3 (three) times daily before meals.) 12 mL 0  ? Insulin Degludec (TRESIBA FLEXTOUCH [place]) Inject 90 Units into the skin daily.     ? levETIRAcetam (KEPPRA) 750 MG tablet TAKE 1 TABLET(750 MG) BY MOUTH TWICE DAILY 180 tablet 0  ? levothyroxine (SYNTHROID) 125 MCG tablet Take 125 mcg by mouth daily.    ? levothyroxine (SYNTHROID) 150 MCG tablet Take 150 mcg by mouth daily.    ? lidocaine-prilocaine (EMLA) cream Apply 1 application topically as needed. Apply to portacath site 1 hour prior to use 30 g 3  ? LORazepam (ATIVAN) 0.5 MG tablet Take 1 tablet (0.5 mg total) by mouth every 8 (eight) hours as needed for anxiety. 30 tablet 0  ? MAGnesium-Oxide 400 (240 Mg) MG tablet Take 1 tablet (400 mg total) by mouth 3 (three) times daily. 90 tablet 1  ? meclizine (ANTIVERT) 25 MG tablet Take 25 mg by mouth 3 (three) times daily as needed for dizziness.  1  ? metFORMIN (GLUCOPHAGE-XR) 500 MG 24 hr tablet Take 1,000 mg by mouth in the morning and at bedtime.  3  ? metoprolol tartrate (LOPRESSOR) 50 MG tablet TAKE 1 TABLET(50 MG) BY MOUTH TWICE DAILY 180 tablet 3  ? mirtazapine (REMERON) 15 MG tablet Take 15 mg by mouth at bedtime.    ? nitroGLYCERIN (NITROSTAT) 0.4 MG SL tablet DISSOLVE 1 TABLET UNDER TONGUE EVERY 5 MINUTES AS NEEDED FOR CHEST PAIN. IF NO RELIEF AFTER 3 DOSES CALL 911. 25  tablet 5  ? OLANZapine (ZYPREXA) 10 MG tablet Take 0.5 tablets (5 mg total) by mouth as directed. Take daily x 3 days starting day of pump discontinue for each chemo (2 cycles per month) 3 tablet 3  ? ondan

## 2021-08-11 ENCOUNTER — Other Ambulatory Visit: Payer: Self-pay | Admitting: Oncology

## 2021-08-11 ENCOUNTER — Other Ambulatory Visit (HOSPITAL_COMMUNITY): Payer: Self-pay

## 2021-08-11 DIAGNOSIS — C787 Secondary malignant neoplasm of liver and intrahepatic bile duct: Secondary | ICD-10-CM

## 2021-08-11 MED ORDER — LONSURF 20-8.19 MG PO TABS
60.0000 mg | ORAL_TABLET | Freq: Two times a day (BID) | ORAL | 0 refills | Status: DC
  Filled 2021-08-11: qty 60, 28d supply, fill #0

## 2021-08-12 ENCOUNTER — Other Ambulatory Visit (HOSPITAL_COMMUNITY): Payer: Self-pay

## 2021-08-15 ENCOUNTER — Other Ambulatory Visit: Payer: Self-pay | Admitting: Oncology

## 2021-08-16 ENCOUNTER — Inpatient Hospital Stay: Payer: Medicaid Other | Attending: Oncology

## 2021-08-16 ENCOUNTER — Inpatient Hospital Stay: Payer: Medicaid Other

## 2021-08-16 ENCOUNTER — Encounter: Payer: Self-pay | Admitting: *Deleted

## 2021-08-16 ENCOUNTER — Inpatient Hospital Stay: Payer: Medicaid Other | Admitting: Oncology

## 2021-08-16 VITALS — BP 139/94

## 2021-08-16 VITALS — BP 140/90 | HR 100 | Temp 97.7°F | Resp 18 | Ht 70.0 in | Wt 231.0 lb

## 2021-08-16 DIAGNOSIS — C7802 Secondary malignant neoplasm of left lung: Secondary | ICD-10-CM | POA: Insufficient documentation

## 2021-08-16 DIAGNOSIS — C187 Malignant neoplasm of sigmoid colon: Secondary | ICD-10-CM | POA: Insufficient documentation

## 2021-08-16 DIAGNOSIS — C787 Secondary malignant neoplasm of liver and intrahepatic bile duct: Secondary | ICD-10-CM | POA: Diagnosis present

## 2021-08-16 DIAGNOSIS — C7801 Secondary malignant neoplasm of right lung: Secondary | ICD-10-CM | POA: Diagnosis not present

## 2021-08-16 DIAGNOSIS — Z5112 Encounter for antineoplastic immunotherapy: Secondary | ICD-10-CM | POA: Diagnosis present

## 2021-08-16 DIAGNOSIS — C189 Malignant neoplasm of colon, unspecified: Secondary | ICD-10-CM

## 2021-08-16 LAB — TOTAL PROTEIN, URINE DIPSTICK: Protein, ur: NEGATIVE mg/dL

## 2021-08-16 LAB — CMP (CANCER CENTER ONLY)
ALT: 36 U/L (ref 0–44)
AST: 31 U/L (ref 15–41)
Albumin: 4.1 g/dL (ref 3.5–5.0)
Alkaline Phosphatase: 115 U/L (ref 38–126)
Anion gap: 10 (ref 5–15)
BUN: 6 mg/dL (ref 6–20)
CO2: 25 mmol/L (ref 22–32)
Calcium: 9.3 mg/dL (ref 8.9–10.3)
Chloride: 102 mmol/L (ref 98–111)
Creatinine: 0.97 mg/dL (ref 0.61–1.24)
GFR, Estimated: >60 mL/min (ref >60)
Glucose, Bld: 263 mg/dL — ABNORMAL HIGH (ref 70–99)
Potassium: 4 mmol/L (ref 3.5–5.1)
Sodium: 137 mmol/L (ref 135–145)
Total Bilirubin: 0.4 mg/dL (ref 0.3–1.2)
Total Protein: 7.6 g/dL (ref 6.5–8.1)

## 2021-08-16 LAB — CBC WITH DIFFERENTIAL (CANCER CENTER ONLY)
Abs Immature Granulocytes: 0.03 10*3/uL (ref 0.00–0.07)
Basophils Absolute: 0 10*3/uL (ref 0.0–0.1)
Basophils Relative: 0 %
Eosinophils Absolute: 0.2 10*3/uL (ref 0.0–0.5)
Eosinophils Relative: 2 %
HCT: 42.8 % (ref 39.0–52.0)
Hemoglobin: 13.1 g/dL (ref 13.0–17.0)
Immature Granulocytes: 0 %
Lymphocytes Relative: 37 %
Lymphs Abs: 2.8 10*3/uL (ref 0.7–4.0)
MCH: 25.3 pg — ABNORMAL LOW (ref 26.0–34.0)
MCHC: 30.6 g/dL (ref 30.0–36.0)
MCV: 82.8 fL (ref 80.0–100.0)
Monocytes Absolute: 0.6 10*3/uL (ref 0.1–1.0)
Monocytes Relative: 8 %
Neutro Abs: 4 10*3/uL (ref 1.7–7.7)
Neutrophils Relative %: 53 %
Platelet Count: 194 10*3/uL (ref 150–400)
RBC: 5.17 MIL/uL (ref 4.22–5.81)
RDW: 19.9 % — ABNORMAL HIGH (ref 11.5–15.5)
WBC Count: 7.6 10*3/uL (ref 4.0–10.5)
nRBC: 0 % (ref 0.0–0.2)

## 2021-08-16 LAB — CEA (ACCESS): CEA (CHCC): 39.22 ng/mL — ABNORMAL HIGH (ref 0.00–5.00)

## 2021-08-16 MED ORDER — SODIUM CHLORIDE 0.9% FLUSH
10.0000 mL | INTRAVENOUS | Status: DC | PRN
  Administered 2021-08-16: 10 mL

## 2021-08-16 MED ORDER — SODIUM CHLORIDE 0.9 % IV SOLN
5.0000 mg/kg | Freq: Once | INTRAVENOUS | Status: AC
  Administered 2021-08-16: 500 mg via INTRAVENOUS
  Filled 2021-08-16: qty 16

## 2021-08-16 MED ORDER — HEPARIN SOD (PORK) LOCK FLUSH 100 UNIT/ML IV SOLN
500.0000 [IU] | Freq: Once | INTRAVENOUS | Status: AC | PRN
  Administered 2021-08-16: 500 [IU]

## 2021-08-16 MED ORDER — SODIUM CHLORIDE 0.9 % IV SOLN: Freq: Once | INTRAVENOUS | Status: AC

## 2021-08-16 NOTE — Progress Notes (Signed)
Patient presents for treatment. RN assessment completed along with the following: ? ?Labs/vitals reviewed - Yes, and within treatment parameters.   ?Weight within 10% of previous measurement - Yes ?Informed consent completed and reflects current therapy/intent - Yes, on date 09/09/19             ?Provider progress note reviewed - Yes, today's provider note was reviewed. ?Treatment/Antibody/Supportive plan reviewed - Yes, and there are no adjustments needed for today's treatment. ?S&H and other orders reviewed - Yes, and there are no additional orders identified. ?Previous treatment date reviewed - Yes, and the appropriate amount of time has elapsed between treatments. ?Clinic Hand Off Received from - Merceda Elks, RN ? ?Patient to proceed with treatment.   ?

## 2021-08-16 NOTE — Progress Notes (Signed)
Patient seen by Dr. Sherrill today ? ?Vitals are within treatment parameters. ? ?Labs reviewed by Dr. Sherrill and are within treatment parameters. ? ?Per physician team, patient is ready for treatment and there are NO modifications to the treatment plan.  ?

## 2021-08-16 NOTE — Progress Notes (Signed)
?[facility] ?OFFICE PROGRESS NOTE ? ? ?Diagnosis: Colon cancer ? ?INTERVAL HISTORY:  ? ?Jeff [name] returns as scheduled.  He completed another cycle of Lonsurf beginning 07/19/2021.  He continues every 2-week bevacizumab.  No bleeding or symptom of thrombosis.  No nausea or diarrhea.  Good appetite. ? ?Objective: ? ?Vital signs in last 24 hours: ? ?Blood pressure 140/90, pulse 100, temperature 97.7 ?F (36.5 ?C), temperature source Oral, resp. rate 18, height $RemoveBe'5\' 10"'nmveHDTsO$  (1.778 m), weight 231 lb (104.8 kg), SpO2 100 %. ?  ? ?HEENT: No thrush or ulcers ?Resp: Lungs clear bilaterally ?Cardio: Regular rate and rhythm ?GI: No hepatosplenomegaly, nontender ?Vascular: No leg edema  ?Skin: Palms without erythema ? ?Portacath/PICC-without erythema ? ?Lab Results: ? ?Lab Results  ?Component Value Date  ? WBC 7.6 08/16/2021  ? HGB 13.1 08/16/2021  ? HCT 42.8 08/16/2021  ? MCV 82.8 08/16/2021  ? PLT 194 08/16/2021  ? NEUTROABS 4.0 08/16/2021  ? ? ?CMP  ?Lab Results  ?Component Value Date  ? NA 138 08/02/2021  ? K 3.9 08/02/2021  ? CL 101 08/02/2021  ? CO2 26 08/02/2021  ? GLUCOSE 354 (H) 08/02/2021  ? BUN 8 08/02/2021  ? CREATININE 0.94 08/02/2021  ? CALCIUM 9.3 08/02/2021  ? PROT 7.4 08/02/2021  ? ALBUMIN 4.1 08/02/2021  ? AST 32 08/02/2021  ? ALT 35 08/02/2021  ? ALKPHOS 113 08/02/2021  ? BILITOT 0.6 08/02/2021  ? GFRNONAA >60 08/02/2021  ? GFRAA >60 01/30/2020  ? ? ?Lab Results  ?Component Value Date  ? CEA1 3.19 09/10/2020  ? CEA 21.73 (H) 07/06/2021  ? ? ?Lab Results  ?Component Value Date  ? INR 0.98 01/19/2016  ? LABPROT 13.0 01/19/2016  ? ? ?Imaging: ? ?No results found. ? ?Medications: I have reviewed the patient's current medications. ? ? 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, biopsy confirmed adenocarcinoma         ?CTs chest, abdomen, and pelvis on 12/11/2015-indeterminate tiny pulmonary nodules, multiple liver metastases, small nodes  in the sigmoid mesocolon ?Laparoscopic sigmoid colectomy, liver biopsy, Port-A-Cath placement 01/14/2016 ?Pathology sigmoid colon resection- colonic adenocarcinoma, 5.3 cm extending into pericolonic connective tissue, positive lymph-vascular invasion, positive perineural invasion, negative margins, metastatic carcinoma in 9 of 28 lymph nodes ?Pathology liver biopsy-metastatic colorectal adenocarcinoma ?MSI stable; mismatch repair protein normal ?APC alteration, K RAS wild-type, no BRAF mutation ?Cycle 1 FOLFOX 02/02/2016 ?Cycle 2 FOLFOX 02/15/2016 ?Cycle 3 FOLFOX 02/29/2016 ?Cycle 4 FOLFOX 03/14/2016 ?Cycle 5 FOLFOX 03/28/2016 ?Cycle 6 FOLFOX 04/11/2016 (oxaliplatin held secondary to thrombocytopenia) ?04/21/2016 restaging CTs-difficulty evaluating liver lesions due to hepatic steatosis. Stable right adrenal nodule. No adenopathy or local recurrence near the rectosigmoid anastomotic site. ?Cycle 7 FOLFOX 04/25/2016 ?MRI liver 05/02/2016-partial improvement in hepatic metastases ?Cycle 8 FOLFOX 05/10/2016 ?Cycle 9 FOLFOX 05/23/2016 (oxaliplatin held due to thrombocytopenia) ?Cycle 10 FOLFOX 06/06/2016 (oxaliplatin dose reduced due to thrombocytopenia) ?Cycle 11 FOLFOX 06/27/2016 (oxaliplatin held due to neuropathy) ?Cycle 12 FOLFOX 07/11/2016 (oxaliplatin held) ?Initiation of maintenance Xeloda 7 days on/7 days off 07/27/2016 ?MRI liver 11/18/2016-decrease in hepatic metastatic disease. No new or progressive disease identified within the abdomen. ?Continuation of Xeloda 7 days on/7 days off ?MRI liver 04/27/2017-previous liver lesions not identified, no new lesions, no lymphadenopathy ?Xeloda continued 7 days on/7 days off ?MRI liver 12/04/2017 - no evidence of metastatic disease, hepatic steatosis ?Xeloda continued 7 days on/7 days off ?MRI liver 07/15/2018- no evidence of metastatic disease.  Stable severe hepatic steatosis. ?  Xeloda continued 7 days on/7 days off ?MRI liver 03/16/2019-hepatic steatosis, no liver mass,  focal area of intrahepatic biliary dilatation in segments 2 and 3 of the left lobe-increased ?Xeloda continued 7 days on/7 days off ?MRI abdomen 08/19/2019-no findings to suggest liver metastases.  Bilateral lung nodules measuring up to 2.8 cm, progressive and more conspicuous than on previous exam ?CT chest 08/29/2019-multiple pulmonary metastases, new from 04/21/2016 ?Cycle 1 FOLFIRI/bevacizumab 09/09/2019 ?Cycle 2 FOLFIRI/bevacizumab 09/26/2019  ?Cycle 3 FOLFIRI/bevacizumab 10/10/2019 ?Cycle 4 FOLFIRI/bevacizumab 10/24/2019 ?Cycle 5 FOLFIRI/bevacizumab 11/07/2019 ?CT chest 11/14/2019-decreased size of lung nodules, no new lesions, hepatic steatosis ?Cycle 6 FOLFIRI/bevacizumab 11/21/2019 ?Cycle 7 FOLFIRI/bevacizumab 12/05/2019 ?Cycle 8 FOLFIRI/bevacizumab 12/19/2019 ?Cycle 9 FOLFIRI/bevacizumab 01/02/2020 ?Cycle 10 FOLFIRI/bevacizumab 01/16/2020 ?CT chest 01/29/2020-stable bilateral pulmonary metastases.  No new or progressive metastatic disease in the chest. ?Cycle 11 FOLFIRI/bevacizumab 01/30/2020 ?Cycle 12 FOLFIRI/bevacizumab 02/19/2020 ?Cycle 13 FOLFIRI/bevacizumab 03/12/2020 ?Cycle 14 FOLFIRI/bevacizumab 04/02/2020 ?Cycle 15 FOLFIRI/bevacizumab 04/23/2020 ?Cycle 16 FOLFIRI/bevacizumab 05/21/2020 ?CT chest 06/09/2020-mild progression pulmonary metastasis.  Some lesions have increased in size while others are similar. ?Cycle 1 irinotecan/Panitumumab 06/18/2020 ?Cycle 2 irinotecan/Panitumumab 07/02/2020 ?Cycle 3 irinotecan/panitumumab 07/16/2020 ?Cycle 4 irinotecan/Panitumumab 07/30/2020 ?Cycle 5 irinotecan/Panitumumab 08/13/2020, Emend added ?Cycle 6 irinotecan/Panitumumab 08/27/2020 ?CT chest 09/08/2020-decreased size of pulmonary nodules, no progressive disease ?Cycle 7 irinotecan/panitumumab 09/02/2020 ?Cycle 8 irinotecan/panitumumab 09/24/2020 ?Cycle 9 irinotecan/Panitumumab 10/08/2020 ?10/22/2020 treatment held due to left foot fracture, need for surgery ?Cycle 10 irinotecan/panitumumab 11/24/2020 ?Cycle 11 irinotecan/Panitumumab  12/08/2020 ?Cycle 12 irinotecan/panitumumab 12/22/2020 ?Cycle 13 irinotecan/panitumumab 01/05/2021 ?01/20/2021 CT chest-mixed response with minimal increase in size of some lesions and minimal decrease in the size of other lesions.  Overall number of lesions is unchanged. ?Cycle 14 irinotecan/Panitumumab 02/05/2021 ?Cycle 15 irinotecan/Panitumumab 02/23/2021 ?Cycle 16 irinotecan/panitumumab 03/16/2021 ?Cycle 17 irinotecan/Panitumumab 04/13/2021 ?05/03/2021-CT chest-enlargement of pulmonary metastases, no new lesions ?06/21/2021-cycle 1 Lonsurf/bevacizumab ?07/19/2021-cycle 2 Lonsurf/bevacizumab ?08/16/2021-cycle 3 Lonsurf/bevacizumab ?  ?2.   Rectal bleeding and constipation secondary to #1 ?  ?3.   History of peripheral vascular disease, status post left lower extremity vascular bypass surgery in April 2017 ?  ?4.   History of nephrolithiasis ?  ?5.   History of Graves' disease treated with radioactive iodine ?  ?6.   Anxiety/depression ?  ?7.   Hypertension ?  ?8.   Hospitalization 01/19/2016 with wound dehiscence status post secondary suture closure of abdominal wall ?  ?9.   Thrombocytopenia secondary to chemotherapy-oxaliplatin held with cycle 6 and cycle 9 FOLFOX ?  ?10. Hyperglycemia 06/20/2016-diagnosed with diabetes, maintained on insulin ?  ?11.  Positive COVID test 12/13/2018; positive COVID test 01/25/2021 ?  ?12. Hospitalized with seizure activity/DKA. Now on Keppra, insulin adjusted. 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  Left foot fracture-surgical repair 11/11/2020 ?  ?  ? ? ?Disposition: ?Jeff [name] appears stable.  He is tolerating the Lonsurf/bevacizumab well.  He will complete cycle 3 beginning today.  He will return for bevacizumab in 2 weeks.  He will be scheduled for an office visit in 4 weeks.   Jeff [name] will be referred for a restaging CT after cycle 4.  We will follow-up on the CEA from today. ? ?Betsy Coder, MD ? ?08/16/2021  ?11:57 AM ? ? ?

## 2021-08-16 NOTE — Progress Notes (Signed)
The following biosimilar Mvasi (bevacizumab-awwb) has been selected for use in this patient due to [employer].  ?

## 2021-08-16 NOTE — Patient Instructions (Signed)
[facility]   ?Discharge Instructions: ?Thank you for choosing [facility] to provide your oncology and hematology care.  ? ?If you have a lab appointment with the [facility], please go directly to the [facility] and check in at the registration area. ?  ?Wear comfortable clothing and clothing appropriate for easy access to any Portacath or PICC line.  ? ?We strive to give you quality time with your provider. You may need to reschedule your appointment if you arrive late (15 or more minutes).  Arriving late affects you and other patients whose appointments are after yours.  Also, if you miss three or more appointments without notifying the office, you may be dismissed from the clinic at the provider?s discretion.    ?  ?For prescription refill requests, have your pharmacy contact our office and allow 72 hours for refills to be completed.   ? ?Today you received the following chemotherapy and/or immunotherapy agents Bevacizumab-bvzr (ZIRABEV).    ?  ?To help prevent nausea and vomiting after your treatment, we encourage you to take your nausea medication as directed. ? ?BELOW ARE SYMPTOMS THAT SHOULD BE REPORTED IMMEDIATELY: ?*FEVER GREATER THAN 100.4 F (38 ?C) OR HIGHER ?*CHILLS OR SWEATING ?*NAUSEA AND VOMITING THAT IS NOT CONTROLLED WITH YOUR NAUSEA MEDICATION ?*UNUSUAL SHORTNESS OF BREATH ?*UNUSUAL BRUISING OR BLEEDING ?*URINARY PROBLEMS (pain or burning when urinating, or frequent urination) ?*BOWEL PROBLEMS (unusual diarrhea, constipation, pain near the anus) ?TENDERNESS IN MOUTH AND THROAT WITH OR WITHOUT PRESENCE OF ULCERS (sore throat, sores in mouth, or a toothache) ?UNUSUAL RASH, SWELLING OR PAIN  ?UNUSUAL VAGINAL DISCHARGE OR ITCHING  ? ?Items with * indicate a potential emergency and should be followed up as soon as possible or go to the Emergency Department if any problems should occur. ? ?Please show the CHEMOTHERAPY ALERT CARD or IMMUNOTHERAPY ALERT CARD  at check-in to the Emergency Department and triage nurse. ? ?Should you have questions after your visit or need to cancel or reschedule your appointment, please contact [facility]  Dept: [phone]  and follow the prompts.  Office hours are 8:00 a.m. to 4:30 p.m. Monday - Friday. Please note that voicemails left after 4:00 p.m. may not be returned until the following business day.  We are closed weekends and major holidays. You have access to a nurse at all times for urgent questions. Please call the main number to the clinic Dept: [phone] and follow the prompts. ? ? ?For any non-urgent questions, you may also contact your provider using MyChart. We now offer e-Visits for anyone 24 and older to request care online for non-urgent symptoms. For details visit [email or web address]. ?  ?Also download the MyChart app! Go to the app store, search "MyChart", open the app, select [facility], and log in with your MyChart username and password. ? ?Due to Covid, a mask is required upon entering the hospital/clinic. If you do not have a mask, one will be given to you upon arrival. For doctor visits, patients may have 1 support person aged 22 or older with them. For treatment visits, patients cannot have anyone with them due to current Covid guidelines and our immunocompromised population.  ? ?Bevacizumab injection ?What is this medication? ?BEVACIZUMAB (be va SIZ yoo mab) is a monoclonal antibody. It is used to treat many types of cancer. ?This medicine may be used for other purposes; ask your health care provider or pharmacist if you have questions. ?COMMON BRAND NAME(S):  Alymsys, Avastin, MVASI, Zirabev ?What should I tell my care team before I take this medication? ?They need to know if you have any of these conditions: ?diabetes ?heart disease ?high blood pressure ?history of coughing up blood ?prior anthracycline chemotherapy (e.g., doxorubicin, daunorubicin, epirubicin) ?recent or  ongoing radiation therapy ?recent or planning to have surgery ?stroke ?an unusual or allergic reaction to bevacizumab, hamster proteins, mouse proteins, other medicines, foods, dyes, or preservatives ?pregnant or trying to get pregnant ?breast-feeding ?How should I use this medication? ?This medicine is for infusion into a vein. It is given by a health care professional in a hospital or clinic setting. ?Talk to your pediatrician regarding the use of this medicine in children. Special care may be needed. ?Overdosage: If you think you have taken too much of this medicine contact a poison control center or emergency room at once. ?NOTE: This medicine is only for you. Do not share this medicine with others. ?What if I miss a dose? ?It is important not to miss your dose. Call your doctor or health care professional if you are unable to keep an appointment. ?What may interact with this medication? ?Interactions are not expected. ?This list may not describe all possible interactions. Give your health care provider a list of all the medicines, herbs, non-prescription drugs, or dietary supplements you use. Also tell them if you smoke, drink alcohol, or use illegal drugs. Some items may interact with your medicine. ?What should I watch for while using this medication? ?Your condition will be monitored carefully while you are receiving this medicine. You will need important blood work and urine testing done while you are taking this medicine. ?This medicine may increase your risk to bruise or bleed. Call your doctor or health care professional if you notice any unusual bleeding. ?Before having surgery, talk to your health care provider to make sure it is ok. This drug can increase the risk of poor healing of your surgical site or wound. You will need to stop this drug for 28 days before surgery. After surgery, wait at least 28 days before restarting this drug. Make sure the surgical site or wound is healed enough before  restarting this drug. Talk to your health care provider if questions. ?Do not become pregnant while taking this medicine or for 6 months after stopping it. Women should inform their doctor if they wish to become pregnant or think they might be pregnant. There is a potential for serious side effects to an unborn child. Talk to your health care professional or pharmacist for more information. Do not breast-feed an infant while taking this medicine and for 6 months after the last dose. ?This medicine has caused ovarian failure in some women. This medicine may interfere with the ability to have a child. You should talk to your doctor or health care professional if you are concerned about your fertility. ?What side effects may I notice from receiving this medication? ?Side effects that you should report to your doctor or health care professional as soon as possible: ?allergic reactions like skin rash, itching or hives, swelling of the face, lips, or tongue ?chest pain or chest tightness ?chills ?coughing up blood ?high fever ?seizures ?severe constipation ?signs and symptoms of bleeding such as bloody or black, tarry stools; red or dark-brown urine; spitting up blood or brown material that looks like coffee grounds; red spots on the skin; unusual bruising or bleeding from the eye, gums, or nose ?signs and symptoms of a blood clot such as  breathing problems; chest pain; severe, sudden headache; pain, swelling, warmth in the leg ?signs and symptoms of a stroke like changes in vision; confusion; trouble speaking or understanding; severe headaches; sudden numbness or weakness of the face, arm or leg; trouble walking; dizziness; loss of balance or coordination ?stomach pain ?sweating ?swelling of legs or ankles ?vomiting ?weight gain ?Side effects that usually do not require medical attention (report to your doctor or health care professional if they continue or are bothersome): ?back pain ?changes in taste ?decreased  appetite ?dry skin ?nausea ?tiredness ?This list may not describe all possible side effects. Call your doctor for medical advice about side effects. You may report side effects to FDA at 1-800-FDA-1088. ?Where shoul

## 2021-08-18 ENCOUNTER — Telehealth: Payer: Self-pay

## 2021-08-18 NOTE — Telephone Encounter (Signed)
TC to Dr Davy Pique at [facility]. In reference to PT stopping injections.Because it can cause bleeding.Pt is on Avastin. Dr Dionne Ano Nurse stated pt's wife previously called the office. Informed Dr Shade Flood nurse if additional information was needed she give a return call to the office. ?

## 2021-08-19 ENCOUNTER — Other Ambulatory Visit (HOSPITAL_COMMUNITY): Payer: Self-pay

## 2021-08-26 ENCOUNTER — Other Ambulatory Visit: Payer: Self-pay | Admitting: Oncology

## 2021-08-27 ENCOUNTER — Other Ambulatory Visit: Payer: Self-pay

## 2021-08-27 DIAGNOSIS — C187 Malignant neoplasm of sigmoid colon: Secondary | ICD-10-CM

## 2021-08-30 ENCOUNTER — Other Ambulatory Visit: Payer: Self-pay

## 2021-08-30 ENCOUNTER — Inpatient Hospital Stay: Payer: Medicaid Other

## 2021-08-30 VITALS — BP 148/80 | HR 90 | Temp 98.1°F | Resp 18 | Ht 70.0 in | Wt 231.4 lb

## 2021-08-30 DIAGNOSIS — C187 Malignant neoplasm of sigmoid colon: Secondary | ICD-10-CM

## 2021-08-30 DIAGNOSIS — Z5112 Encounter for antineoplastic immunotherapy: Secondary | ICD-10-CM | POA: Diagnosis not present

## 2021-08-30 DIAGNOSIS — C189 Malignant neoplasm of colon, unspecified: Secondary | ICD-10-CM

## 2021-08-30 LAB — CBC WITH DIFFERENTIAL (CANCER CENTER ONLY)
Abs Immature Granulocytes: 0.01 10*3/uL (ref 0.00–0.07)
Basophils Absolute: 0 10*3/uL (ref 0.0–0.1)
Basophils Relative: 0 %
Eosinophils Absolute: 0.1 10*3/uL (ref 0.0–0.5)
Eosinophils Relative: 2 %
HCT: 39.4 % (ref 39.0–52.0)
Hemoglobin: 12.3 g/dL — ABNORMAL LOW (ref 13.0–17.0)
Immature Granulocytes: 0 %
Lymphocytes Relative: 45 %
Lymphs Abs: 2.7 10*3/uL (ref 0.7–4.0)
MCH: 25.8 pg — ABNORMAL LOW (ref 26.0–34.0)
MCHC: 31.2 g/dL (ref 30.0–36.0)
MCV: 82.6 fL (ref 80.0–100.0)
Monocytes Absolute: 0.3 10*3/uL (ref 0.1–1.0)
Monocytes Relative: 6 %
Neutro Abs: 2.9 10*3/uL (ref 1.7–7.7)
Neutrophils Relative %: 47 %
Platelet Count: 128 10*3/uL — ABNORMAL LOW (ref 150–400)
RBC: 4.77 MIL/uL (ref 4.22–5.81)
RDW: 19.5 % — ABNORMAL HIGH (ref 11.5–15.5)
WBC Count: 6 10*3/uL (ref 4.0–10.5)
nRBC: 0.3 % — ABNORMAL HIGH (ref 0.0–0.2)

## 2021-08-30 MED ORDER — SODIUM CHLORIDE 0.9 % IV SOLN: Freq: Once | INTRAVENOUS | Status: AC

## 2021-08-30 MED ORDER — SODIUM CHLORIDE 0.9 % IV SOLN
5.0000 mg/kg | Freq: Once | INTRAVENOUS | Status: AC
  Administered 2021-08-30: 500 mg via INTRAVENOUS
  Filled 2021-08-30: qty 16

## 2021-08-30 MED ORDER — HEPARIN SOD (PORK) LOCK FLUSH 100 UNIT/ML IV SOLN
500.0000 [IU] | Freq: Once | INTRAVENOUS | Status: AC | PRN
  Administered 2021-08-30: 500 [IU]

## 2021-08-30 MED ORDER — SODIUM CHLORIDE 0.9% FLUSH
10.0000 mL | INTRAVENOUS | Status: DC | PRN
  Administered 2021-08-30: 10 mL

## 2021-08-30 NOTE — Patient Instructions (Signed)

## 2021-08-30 NOTE — Patient Instructions (Signed)
[facility]  Discharge Instructions: ?Thank you for choosing [facility] to provide your oncology and hematology care.  ? ?If you have a lab appointment with the [facility], please go directly to the [facility] and check in at the registration area. ?  ?Wear comfortable clothing and clothing appropriate for easy access to any Portacath or PICC line.  ? ?We strive to give you quality time with your provider. You may need to reschedule your appointment if you arrive late (15 or more minutes).  Arriving late affects you and other patients whose appointments are after yours.  Also, if you miss three or more appointments without notifying the office, you may be dismissed from the clinic at the provider?s discretion.    ?  ?For prescription refill requests, have your pharmacy contact our office and allow 72 hours for refills to be completed.   ? ?Today you received the following chemotherapy and/or immunotherapy agents Bevacizumab    ?  ?To help prevent nausea and vomiting after your treatment, we encourage you to take your nausea medication as directed. ? ?BELOW ARE SYMPTOMS THAT SHOULD BE REPORTED IMMEDIATELY: ?*FEVER GREATER THAN 100.4 F (38 ?C) OR HIGHER ?*CHILLS OR SWEATING ?*NAUSEA AND VOMITING THAT IS NOT CONTROLLED WITH YOUR NAUSEA MEDICATION ?*UNUSUAL SHORTNESS OF BREATH ?*UNUSUAL BRUISING OR BLEEDING ?*URINARY PROBLEMS (pain or burning when urinating, or frequent urination) ?*BOWEL PROBLEMS (unusual diarrhea, constipation, pain near the anus) ?TENDERNESS IN MOUTH AND THROAT WITH OR WITHOUT PRESENCE OF ULCERS (sore throat, sores in mouth, or a toothache) ?UNUSUAL RASH, SWELLING OR PAIN  ?UNUSUAL VAGINAL DISCHARGE OR ITCHING  ? ?Items with * indicate a potential emergency and should be followed up as soon as possible or go to the Emergency Department if any problems should occur. ? ?Please show the CHEMOTHERAPY ALERT CARD or IMMUNOTHERAPY ALERT CARD at check-in to  the Emergency Department and triage nurse. ? ?Should you have questions after your visit or need to cancel or reschedule your appointment, please contact [facility]  Dept: [phone]  and follow the prompts.  Office hours are 8:00 a.m. to 4:30 p.m. Monday - Friday. Please note that voicemails left after 4:00 p.m. may not be returned until the following business day.  We are closed weekends and major holidays. You have access to a nurse at all times for urgent questions. Please call the main number to the clinic Dept: [phone] and follow the prompts. ? ? ?For any non-urgent questions, you may also contact your provider using MyChart. We now offer e-Visits for anyone 19 and older to request care online for non-urgent symptoms. For details visit [email or web address]. ?  ?Also download the MyChart app! Go to the app store, search "MyChart", open the app, select [facility], and log in with your MyChart username and password. ? ?Due to Covid, a mask is required upon entering the hospital/clinic. If you do not have a mask, one will be given to you upon arrival. For doctor visits, patients may have 1 support person aged 57 or older with them. For treatment visits, patients cannot have anyone with them due to current Covid guidelines and our immunocompromised population.  ? ?

## 2021-08-30 NOTE — Progress Notes (Signed)
Ok to proceed with treatment today with labs and no urine protein per MD ?

## 2021-08-31 ENCOUNTER — Telehealth: Payer: Self-pay | Admitting: *Deleted

## 2021-08-31 NOTE — Telephone Encounter (Signed)
Left VM that tumor marker CEA has increased some more, so Dr. Benay Spice thinks it is time to re-scan your chest. It has been scheduled here at [facility] for 09/09/21 at 10:30 am with arrival at 10:15. OK to eat since it is without contrast. He wanted it before he sees  you on 09/13/21.  ?Please respond or call to confirm you received this message. ?Notified managed care to work on [place] ?

## 2021-09-02 ENCOUNTER — Other Ambulatory Visit (HOSPITAL_COMMUNITY): Payer: Self-pay

## 2021-09-03 ENCOUNTER — Telehealth: Payer: Self-pay | Admitting: Adult Health

## 2021-09-03 DIAGNOSIS — G4733 Obstructive sleep apnea (adult) (pediatric): Secondary | ICD-10-CM

## 2021-09-03 NOTE — Telephone Encounter (Signed)
Called patient's wife but she did not answer. Left message for her to call us back. An order was placed during the 08/03/21 visit but it was placed incorrectly. Will place the correct order for TP to sign.  ?

## 2021-09-06 ENCOUNTER — Other Ambulatory Visit (HOSPITAL_COMMUNITY): Payer: Self-pay

## 2021-09-06 ENCOUNTER — Other Ambulatory Visit: Payer: Self-pay | Admitting: Oncology

## 2021-09-06 DIAGNOSIS — C189 Malignant neoplasm of colon, unspecified: Secondary | ICD-10-CM

## 2021-09-07 ENCOUNTER — Other Ambulatory Visit (HOSPITAL_COMMUNITY): Payer: Self-pay

## 2021-09-09 ENCOUNTER — Other Ambulatory Visit (HOSPITAL_COMMUNITY): Payer: Self-pay

## 2021-09-09 ENCOUNTER — Encounter (HOSPITAL_BASED_OUTPATIENT_CLINIC_OR_DEPARTMENT_OTHER): Payer: Self-pay

## 2021-09-09 ENCOUNTER — Other Ambulatory Visit: Payer: Self-pay | Admitting: Oncology

## 2021-09-09 ENCOUNTER — Ambulatory Visit (HOSPITAL_BASED_OUTPATIENT_CLINIC_OR_DEPARTMENT_OTHER)
Admission: RE | Admit: 2021-09-09 | Discharge: 2021-09-09 | Disposition: A | Discharge status: Home / Self Care | Payer: Medicaid Other | Source: Ambulatory Visit | Attending: Oncology | Admitting: Oncology

## 2021-09-09 DIAGNOSIS — C189 Malignant neoplasm of colon, unspecified: Secondary | ICD-10-CM | POA: Diagnosis present

## 2021-09-09 DIAGNOSIS — C787 Secondary malignant neoplasm of liver and intrahepatic bile duct: Secondary | ICD-10-CM | POA: Diagnosis present

## 2021-09-10 ENCOUNTER — Other Ambulatory Visit (HOSPITAL_COMMUNITY): Payer: Self-pay

## 2021-09-12 ENCOUNTER — Other Ambulatory Visit: Payer: Self-pay | Admitting: Oncology

## 2021-09-13 ENCOUNTER — Inpatient Hospital Stay: Payer: Medicaid Other

## 2021-09-13 ENCOUNTER — Encounter: Payer: Self-pay | Admitting: Oncology

## 2021-09-13 ENCOUNTER — Encounter: Payer: Self-pay | Admitting: Nurse Practitioner

## 2021-09-13 ENCOUNTER — Inpatient Hospital Stay: Payer: Medicaid Other | Attending: Oncology

## 2021-09-13 ENCOUNTER — Other Ambulatory Visit (HOSPITAL_COMMUNITY): Payer: Self-pay

## 2021-09-13 ENCOUNTER — Inpatient Hospital Stay: Payer: Medicaid Other | Admitting: Nurse Practitioner

## 2021-09-13 ENCOUNTER — Encounter: Payer: Self-pay | Admitting: *Deleted

## 2021-09-13 VITALS — BP 140/80 | HR 100 | Temp 98.1°F | Resp 18 | Ht 70.0 in | Wt 230.8 lb

## 2021-09-13 DIAGNOSIS — C787 Secondary malignant neoplasm of liver and intrahepatic bile duct: Secondary | ICD-10-CM | POA: Insufficient documentation

## 2021-09-13 DIAGNOSIS — C189 Malignant neoplasm of colon, unspecified: Secondary | ICD-10-CM | POA: Diagnosis not present

## 2021-09-13 DIAGNOSIS — C7801 Secondary malignant neoplasm of right lung: Secondary | ICD-10-CM | POA: Insufficient documentation

## 2021-09-13 DIAGNOSIS — C7802 Secondary malignant neoplasm of left lung: Secondary | ICD-10-CM | POA: Insufficient documentation

## 2021-09-13 DIAGNOSIS — Z5112 Encounter for antineoplastic immunotherapy: Secondary | ICD-10-CM | POA: Insufficient documentation

## 2021-09-13 DIAGNOSIS — C187 Malignant neoplasm of sigmoid colon: Secondary | ICD-10-CM

## 2021-09-13 DIAGNOSIS — Z95828 Presence of other vascular implants and grafts: Secondary | ICD-10-CM

## 2021-09-13 DIAGNOSIS — Z452 Encounter for adjustment and management of vascular access device: Secondary | ICD-10-CM | POA: Insufficient documentation

## 2021-09-13 LAB — CBC WITH DIFFERENTIAL (CANCER CENTER ONLY)
Abs Immature Granulocytes: 0.02 K/uL (ref 0.00–0.07)
Basophils Absolute: 0 K/uL (ref 0.0–0.1)
Basophils Relative: 0 %
Eosinophils Absolute: 0.1 K/uL (ref 0.0–0.5)
Eosinophils Relative: 2 %
HCT: 41.9 % (ref 39.0–52.0)
Hemoglobin: 13 g/dL (ref 13.0–17.0)
Immature Granulocytes: 0 %
Lymphocytes Relative: 40 %
Lymphs Abs: 2.2 K/uL (ref 0.7–4.0)
MCH: 26.2 pg (ref 26.0–34.0)
MCHC: 31 g/dL (ref 30.0–36.0)
MCV: 84.3 fL (ref 80.0–100.0)
Monocytes Absolute: 0.6 K/uL (ref 0.1–1.0)
Monocytes Relative: 11 %
Neutro Abs: 2.6 K/uL (ref 1.7–7.7)
Neutrophils Relative %: 47 %
Platelet Count: 210 K/uL (ref 150–400)
RBC: 4.97 MIL/uL (ref 4.22–5.81)
RDW: 19.7 % — ABNORMAL HIGH (ref 11.5–15.5)
WBC Count: 5.5 K/uL (ref 4.0–10.5)
nRBC: 0 % (ref 0.0–0.2)

## 2021-09-13 LAB — CMP (CANCER CENTER ONLY)
ALT: 63 U/L — ABNORMAL HIGH (ref 0–44)
AST: 48 U/L — ABNORMAL HIGH (ref 15–41)
Albumin: 4.2 g/dL (ref 3.5–5.0)
Alkaline Phosphatase: 92 U/L (ref 38–126)
Anion gap: 12 (ref 5–15)
BUN: 5 mg/dL — ABNORMAL LOW (ref 6–20)
CO2: 25 mmol/L (ref 22–32)
Calcium: 9.4 mg/dL (ref 8.9–10.3)
Chloride: 100 mmol/L (ref 98–111)
Creatinine: 0.93 mg/dL (ref 0.61–1.24)
GFR, Estimated: >60 mL/min
Glucose, Bld: 328 mg/dL — ABNORMAL HIGH (ref 70–99)
Potassium: 3.6 mmol/L (ref 3.5–5.1)
Sodium: 137 mmol/L (ref 135–145)
Total Bilirubin: 0.7 mg/dL (ref 0.3–1.2)
Total Protein: 7.4 g/dL (ref 6.5–8.1)

## 2021-09-13 LAB — CEA (ACCESS): CEA (CHCC): 43.93 ng/mL — ABNORMAL HIGH (ref 0.00–5.00)

## 2021-09-13 LAB — TOTAL PROTEIN, URINE DIPSTICK: Protein, ur: NEGATIVE mg/dL

## 2021-09-13 MED ORDER — SODIUM CHLORIDE 0.9 % IV SOLN: Freq: Once | INTRAVENOUS | Status: AC

## 2021-09-13 MED ORDER — SODIUM CHLORIDE 0.9% FLUSH: 10.0000 mL | INTRAVENOUS | Status: DC | PRN

## 2021-09-13 MED ORDER — SODIUM CHLORIDE 0.9% FLUSH
10.0000 mL | INTRAVENOUS | Status: DC | PRN
  Administered 2021-09-13: 10 mL

## 2021-09-13 MED ORDER — HEPARIN SOD (PORK) LOCK FLUSH 100 UNIT/ML IV SOLN
500.0000 [IU] | Freq: Once | INTRAVENOUS | Status: AC | PRN
  Administered 2021-09-13: 500 [IU]

## 2021-09-13 MED ORDER — SODIUM CHLORIDE 0.9 % IV SOLN
5.0000 mg/kg | Freq: Once | INTRAVENOUS | Status: AC
  Administered 2021-09-13: 500 mg via INTRAVENOUS
  Filled 2021-09-13: qty 16

## 2021-09-13 MED ORDER — LONSURF 20-8.19 MG PO TABS
60.0000 mg | ORAL_TABLET | Freq: Two times a day (BID) | ORAL | 0 refills | Status: DC
  Filled 2021-09-13: qty 60, 28d supply, fill #0

## 2021-09-13 MED ORDER — ALTEPLASE 2 MG IJ SOLR
2.0000 mg | Freq: Once | INTRAMUSCULAR | Status: AC | PRN
  Administered 2021-09-13: 2 mg
  Filled 2021-09-13: qty 2

## 2021-09-13 NOTE — Progress Notes (Signed)
Patient seen by Lisa Thomas NP today  Vitals are within treatment parameters.  Labs reviewed by Lisa Thomas NP and are within treatment parameters.  Per physician team, patient is ready for treatment and there are NO modifications to the treatment plan.     

## 2021-09-13 NOTE — Patient Instructions (Signed)
[facility]   ?Discharge Instructions: ?Thank you for choosing [facility] to provide your oncology and hematology care.  ? ?If you have a lab appointment with the [facility], please go directly to the [facility] and check in at the registration area. ?  ?Wear comfortable clothing and clothing appropriate for easy access to any Portacath or PICC line.  ? ?We strive to give you quality time with your provider. You may need to reschedule your appointment if you arrive late (15 or more minutes).  Arriving late affects you and other patients whose appointments are after yours.  Also, if you miss three or more appointments without notifying the office, you may be dismissed from the clinic at the provider?s discretion.    ?  ?For prescription refill requests, have your pharmacy contact our office and allow 72 hours for refills to be completed.   ? ?Today you received the following chemotherapy and/or immunotherapy agents Avastin ?    ?  ?To help prevent nausea and vomiting after your treatment, we encourage you to take your nausea medication as directed. ? ?BELOW ARE SYMPTOMS THAT SHOULD BE REPORTED IMMEDIATELY: ?*FEVER GREATER THAN 100.4 F (38 ?C) OR HIGHER ?*CHILLS OR SWEATING ?*NAUSEA AND VOMITING THAT IS NOT CONTROLLED WITH YOUR NAUSEA MEDICATION ?*UNUSUAL SHORTNESS OF BREATH ?*UNUSUAL BRUISING OR BLEEDING ?*URINARY PROBLEMS (pain or burning when urinating, or frequent urination) ?*BOWEL PROBLEMS (unusual diarrhea, constipation, pain near the anus) ?TENDERNESS IN MOUTH AND THROAT WITH OR WITHOUT PRESENCE OF ULCERS (sore throat, sores in mouth, or a toothache) ?UNUSUAL RASH, SWELLING OR PAIN  ?UNUSUAL VAGINAL DISCHARGE OR ITCHING  ? ?Items with * indicate a potential emergency and should be followed up as soon as possible or go to the Emergency Department if any problems should occur. ? ?Please show the CHEMOTHERAPY ALERT CARD or IMMUNOTHERAPY ALERT CARD at check-in to  the Emergency Department and triage nurse. ? ?Should you have questions after your visit or need to cancel or reschedule your appointment, please contact [facility]  Dept: [phone]  and follow the prompts.  Office hours are 8:00 a.m. to 4:30 p.m. Monday - Friday. Please note that voicemails left after 4:00 p.m. may not be returned until the following business day.  We are closed weekends and major holidays. You have access to a nurse at all times for urgent questions. Please call the main number to the clinic Dept: [phone] and follow the prompts. ? ? ?For any non-urgent questions, you may also contact your provider using MyChart. We now offer e-Visits for anyone 25 and older to request care online for non-urgent symptoms. For details visit [email or web address]. ?  ?Also download the MyChart app! Go to the app store, search "MyChart", open the app, select [facility], and log in with your MyChart username and password. ? ?Due to Covid, a mask is required upon entering the hospital/clinic. If you do not have a mask, one will be given to you upon arrival. For doctor visits, patients may have 1 support person aged 93 or older with them. For treatment visits, patients cannot have anyone with them due to current Covid guidelines and our immunocompromised population.  ? ?Bevacizumab injection ?What is this medication? ?BEVACIZUMAB (be va SIZ yoo mab) is a monoclonal antibody. It is used to treat many types of cancer. ?This medicine may be used for other purposes; ask your health care provider or pharmacist if you have questions. ?COMMON BRAND NAME(S):  Alymsys, Avastin, MVASI, Zirabev ?What should I tell my care team before I take this medication? ?They need to know if you have any of these conditions: ?diabetes ?heart disease ?high blood pressure ?history of coughing up blood ?prior anthracycline chemotherapy (e.g., doxorubicin, daunorubicin, epirubicin) ?recent or ongoing radiation  therapy ?recent or planning to have surgery ?stroke ?an unusual or allergic reaction to bevacizumab, hamster proteins, mouse proteins, other medicines, foods, dyes, or preservatives ?pregnant or trying to get pregnant ?breast-feeding ?How should I use this medication? ?This medicine is for infusion into a vein. It is given by a health care professional in a hospital or clinic setting. ?Talk to your pediatrician regarding the use of this medicine in children. Special care may be needed. ?Overdosage: If you think you have taken too much of this medicine contact a poison control center or emergency room at once. ?NOTE: This medicine is only for you. Do not share this medicine with others. ?What if I miss a dose? ?It is important not to miss your dose. Call your doctor or health care professional if you are unable to keep an appointment. ?What may interact with this medication? ?Interactions are not expected. ?This list may not describe all possible interactions. Give your health care provider a list of all the medicines, herbs, non-prescription drugs, or dietary supplements you use. Also tell them if you smoke, drink alcohol, or use illegal drugs. Some items may interact with your medicine. ?What should I watch for while using this medication? ?Your condition will be monitored carefully while you are receiving this medicine. You will need important blood work and urine testing done while you are taking this medicine. ?This medicine may increase your risk to bruise or bleed. Call your doctor or health care professional if you notice any unusual bleeding. ?Before having surgery, talk to your health care provider to make sure it is ok. This drug can increase the risk of poor healing of your surgical site or wound. You will need to stop this drug for 28 days before surgery. After surgery, wait at least 28 days before restarting this drug. Make sure the surgical site or wound is healed enough before restarting this drug.  Talk to your health care provider if questions. ?Do not become pregnant while taking this medicine or for 6 months after stopping it. Women should inform their doctor if they wish to become pregnant or think they might be pregnant. There is a potential for serious side effects to an unborn child. Talk to your health care professional or pharmacist for more information. Do not breast-feed an infant while taking this medicine and for 6 months after the last dose. ?This medicine has caused ovarian failure in some women. This medicine may interfere with the ability to have a child. You should talk to your doctor or health care professional if you are concerned about your fertility. ?What side effects may I notice from receiving this medication? ?Side effects that you should report to your doctor or health care professional as soon as possible: ?allergic reactions like skin rash, itching or hives, swelling of the face, lips, or tongue ?chest pain or chest tightness ?chills ?coughing up blood ?high fever ?seizures ?severe constipation ?signs and symptoms of bleeding such as bloody or black, tarry stools; red or dark-brown urine; spitting up blood or brown material that looks like coffee grounds; red spots on the skin; unusual bruising or bleeding from the eye, gums, or nose ?signs and symptoms of a blood clot such as  breathing problems; chest pain; severe, sudden headache; pain, swelling, warmth in the leg ?signs and symptoms of a stroke like changes in vision; confusion; trouble speaking or understanding; severe headaches; sudden numbness or weakness of the face, arm or leg; trouble walking; dizziness; loss of balance or coordination ?stomach pain ?sweating ?swelling of legs or ankles ?vomiting ?weight gain ?Side effects that usually do not require medical attention (report to your doctor or health care professional if they continue or are bothersome): ?back pain ?changes in taste ?decreased appetite ?dry  skin ?nausea ?tiredness ?This list may not describe all possible side effects. Call your doctor for medical advice about side effects. You may report side effects to FDA at 1-800-FDA-1088. ?Where should I keep my medica

## 2021-09-13 NOTE — Progress Notes (Signed)
Patient presents for treatment. RN assessment completed along with the following: ? ?Labs/vitals reviewed - Yes, and within treatment parameters.   ?Weight within 10% of previous measurement - Yes ?Informed consent completed and reflects current therapy/intent - Yes, on date 06/18/20             ?Provider progress note reviewed - Today's provider note is not yet available. I reviewed the most recent oncology provider progress note in chart dated 08/16/21. ?Treatment/Antibody/Supportive plan reviewed - Yes, and there are no adjustments needed for today's treatment. ?S&H and other orders reviewed - Yes, and there are no additional orders identified. ?Previous treatment date reviewed - Yes, and the appropriate amount of time has elapsed between treatments. ?Clinic Hand Off Received from - Cristy Friedlander, RN ? ? ?Patient to proceed with treatment.  ? ?

## 2021-09-13 NOTE — Progress Notes (Signed)
[facility] OFFICE PROGRESS NOTE   Diagnosis: Colon cancer  INTERVAL HISTORY:   [name] returns as scheduled.  He completed cycle 3 Lonsurf beginning 08/16/2021.  He continues every 2-week bevacizumab.  He has occasional nausea.  No vomiting.  No mouth sores.  No diarrhea or rash.  He denies bleeding.  Objective:  Vital signs in last 24 hours:  Blood pressure 140/80, pulse 100, temperature 98.1 F (36.7 C), temperature source Oral, resp. rate 18, height 5\' 10"  (1.778 m), weight 230 lb 12.8 oz (104.7 kg), SpO2 99 %.    HEENT: No thrush or ulcers. Resp: Lungs clear bilaterally. Cardio: Regular rate and rhythm. GI: Abdomen soft and nontender.  No hepatomegaly. Vascular: No leg edema. Port-A-Cath without erythema.  Lab Results:  Lab Results  Component Value Date   WBC 5.5 09/13/2021   HGB 13.0 09/13/2021   HCT 41.9 09/13/2021   MCV 84.3 09/13/2021   PLT 210 09/13/2021   NEUTROABS 2.6 09/13/2021    Imaging:  No results found.  Medications: I have reviewed the patient's current medications.  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, biopsy confirmed adenocarcinoma         CTs chest, abdomen, and pelvis on 12/11/2015-indeterminate tiny pulmonary nodules, multiple liver metastases, small nodes in the sigmoid mesocolon Laparoscopic sigmoid colectomy, liver biopsy, Port-A-Cath placement 01/14/2016 Pathology sigmoid colon resection- colonic adenocarcinoma, 5.3 cm extending into pericolonic connective tissue, positive lymph-vascular invasion, positive perineural invasion, negative margins, metastatic carcinoma in 9 of 28 lymph nodes Pathology liver biopsy-metastatic colorectal adenocarcinoma MSI stable; mismatch repair protein normal APC alteration, K RAS wild-type, no BRAF mutation Cycle 1 FOLFOX 02/02/2016 Cycle 2 FOLFOX 02/15/2016 Cycle 3 FOLFOX 02/29/2016 Cycle 4 FOLFOX 03/14/2016 Cycle  5 FOLFOX 03/28/2016 Cycle 6 FOLFOX 04/11/2016 (oxaliplatin held secondary to thrombocytopenia) 04/21/2016 restaging CTs-difficulty evaluating liver lesions due to hepatic steatosis. Stable right adrenal nodule. No adenopathy or local recurrence near the rectosigmoid anastomotic site. Cycle 7 FOLFOX 04/25/2016 MRI liver 05/02/2016-partial improvement in hepatic metastases Cycle 8 FOLFOX 05/10/2016 Cycle 9 FOLFOX 05/23/2016 (oxaliplatin held due to thrombocytopenia) Cycle 10 FOLFOX 06/06/2016 (oxaliplatin dose reduced due to thrombocytopenia) Cycle 11 FOLFOX 06/27/2016 (oxaliplatin held due to neuropathy) Cycle 12 FOLFOX 07/11/2016 (oxaliplatin held) Initiation of maintenance Xeloda 7 days on/7 days off 07/27/2016 MRI liver 11/18/2016-decrease in hepatic metastatic disease. No new or progressive disease identified within the abdomen. Continuation of Xeloda 7 days on/7 days off MRI liver 04/27/2017-previous liver lesions not identified, no new lesions, no lymphadenopathy Xeloda continued 7 days on/7 days off MRI liver 12/04/2017 - no evidence of metastatic disease, hepatic steatosis Xeloda continued 7 days on/7 days off MRI liver 07/15/2018- no evidence of metastatic disease.  Stable severe hepatic steatosis. Xeloda continued 7 days on/7 days off MRI liver 03/16/2019-hepatic steatosis, no liver mass, focal area of intrahepatic biliary dilatation in segments 2 and 3 of the left lobe-increased Xeloda continued 7 days on/7 days off MRI abdomen 08/19/2019-no findings to suggest liver metastases.  Bilateral lung nodules measuring up to 2.8 cm, progressive and more conspicuous than on previous exam CT chest 08/29/2019-multiple pulmonary metastases, new from 04/21/2016 Cycle 1 FOLFIRI/bevacizumab 09/09/2019 Cycle 2 FOLFIRI/bevacizumab 09/26/2019  Cycle 3 FOLFIRI/bevacizumab 10/10/2019 Cycle 4 FOLFIRI/bevacizumab 10/24/2019 Cycle 5 FOLFIRI/bevacizumab 11/07/2019 CT chest 11/14/2019-decreased size of lung nodules,  no new lesions, hepatic steatosis Cycle 6 FOLFIRI/bevacizumab 11/21/2019 Cycle 7 FOLFIRI/bevacizumab 12/05/2019 Cycle 8 FOLFIRI/bevacizumab 12/19/2019 Cycle 9 FOLFIRI/bevacizumab 01/02/2020 Cycle 10 FOLFIRI/bevacizumab 01/16/2020 CT chest 01/29/2020-stable  bilateral pulmonary metastases.  No new or progressive metastatic disease in the chest. Cycle 11 FOLFIRI/bevacizumab 01/30/2020 Cycle 12 FOLFIRI/bevacizumab 02/19/2020 Cycle 13 FOLFIRI/bevacizumab 03/12/2020 Cycle 14 FOLFIRI/bevacizumab 04/02/2020 Cycle 15 FOLFIRI/bevacizumab 04/23/2020 Cycle 16 FOLFIRI/bevacizumab 05/21/2020 CT chest 06/09/2020-mild progression pulmonary metastasis.  Some lesions have increased in size while others are similar. Cycle 1 irinotecan/Panitumumab 06/18/2020 Cycle 2 irinotecan/Panitumumab 07/02/2020 Cycle 3 irinotecan/panitumumab 07/16/2020 Cycle 4 irinotecan/Panitumumab 07/30/2020 Cycle 5 irinotecan/Panitumumab 08/13/2020, Emend added Cycle 6 irinotecan/Panitumumab 08/27/2020 CT chest 09/08/2020-decreased size of pulmonary nodules, no progressive disease Cycle 7 irinotecan/panitumumab 09/02/2020 Cycle 8 irinotecan/panitumumab 09/24/2020 Cycle 9 irinotecan/Panitumumab 10/08/2020 10/22/2020 treatment held due to left foot fracture, need for surgery Cycle 10 irinotecan/panitumumab 11/24/2020 Cycle 11 irinotecan/Panitumumab 12/08/2020 Cycle 12 irinotecan/panitumumab 12/22/2020 Cycle 13 irinotecan/panitumumab 01/05/2021 01/20/2021 CT chest-mixed response with minimal increase in size of some lesions and minimal decrease in the size of other lesions.  Overall number of lesions is unchanged. Cycle 14 irinotecan/Panitumumab 02/05/2021 Cycle 15 irinotecan/Panitumumab 02/23/2021 Cycle 16 irinotecan/panitumumab 03/16/2021 Cycle 17 irinotecan/Panitumumab 04/13/2021 05/03/2021-CT chest-enlargement of pulmonary metastases, no new lesions 06/21/2021-cycle 1 Lonsurf/bevacizumab 07/19/2021-cycle 2 Lonsurf/bevacizumab 08/16/2021-cycle 3  Lonsurf/bevacizumab 09/09/2021 CT chest-bilateral lung nodules and masses with mixed response, overall stable to very minimally increased 09/13/2021 cycle 4 Lonsurf/bevacizumab   2.   Rectal bleeding and constipation secondary to #1   3.   History of peripheral vascular disease, status post left lower extremity vascular bypass surgery in April 2017   4.   History of nephrolithiasis   5.   History of Graves' disease treated with radioactive iodine   6.   Anxiety/depression   7.   Hypertension   8.   Hospitalization 01/19/2016 with wound dehiscence status post secondary suture closure of abdominal wall   9.   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 Hyperglycemia 06/20/2016-diagnosed with diabetes, maintained on insulin   11.  Positive COVID test 12/13/2018; positive COVID test 01/25/2021   12. Hospitalized with seizure activity/DKA. Now on Keppra, insulin adjusted. 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  Left foot fracture-surgical repair 11/11/2020    Disposition: [name] appears stable.  He has completed 3 cycles of Lonsurf/bevacizumab.  Restaging chest CT is overall stable.  Plan to proceed with cycle 4 Lonsurf/bevacizumab today as scheduled.  CT results/images reviewed with Jeff [name] and his wife at today's appointment.  They agree with this plan.  CBC and chemistry panel from today reviewed.  Labs adequate to proceed with treatment.  He will return for bevacizumab in 2 weeks.  We will see him in follow-up in 4 weeks.  We are available to see him sooner if needed.  Patient seen with Dr. Truett Perna.    Lonna Cobb ANP/GNP-BC   09/13/2021  11:34 AM  This was a shared visit with Lonna Cobb.  Jeff [name] has  completed 3 cycles of Lonsurf/bevacizumab.  We reviewed the restaging CT findings and images with Jeff [name] and his wife.  The overall CT findings are consistent with stable disease.  I recommend continuing Lonsurf/bevacizumab.  I was present for greater than 50% of today's visit.  I performed medical decision making.  Mancel Bale, MD

## 2021-09-14 ENCOUNTER — Encounter: Payer: Self-pay | Admitting: Oncology

## 2021-09-17 ENCOUNTER — Other Ambulatory Visit (HOSPITAL_COMMUNITY): Payer: Self-pay

## 2021-09-26 ENCOUNTER — Other Ambulatory Visit: Payer: Self-pay | Admitting: Oncology

## 2021-09-27 ENCOUNTER — Inpatient Hospital Stay: Payer: Medicaid Other

## 2021-09-27 VITALS — BP 148/96 | HR 100 | Temp 97.8°F | Resp 18 | Ht 70.0 in | Wt 229.0 lb

## 2021-09-27 DIAGNOSIS — Z5112 Encounter for antineoplastic immunotherapy: Secondary | ICD-10-CM | POA: Diagnosis not present

## 2021-09-27 DIAGNOSIS — C189 Malignant neoplasm of colon, unspecified: Secondary | ICD-10-CM

## 2021-09-27 DIAGNOSIS — C187 Malignant neoplasm of sigmoid colon: Secondary | ICD-10-CM

## 2021-09-27 LAB — CBC WITH DIFFERENTIAL (CANCER CENTER ONLY)
Abs Immature Granulocytes: 0.04 10*3/uL (ref 0.00–0.07)
Basophils Absolute: 0 10*3/uL (ref 0.0–0.1)
Basophils Relative: 0 %
Eosinophils Absolute: 0.1 10*3/uL (ref 0.0–0.5)
Eosinophils Relative: 2 %
HCT: 40.3 % (ref 39.0–52.0)
Hemoglobin: 12.7 g/dL — ABNORMAL LOW (ref 13.0–17.0)
Immature Granulocytes: 1 %
Lymphocytes Relative: 31 %
Lymphs Abs: 2.3 10*3/uL (ref 0.7–4.0)
MCH: 26.7 pg (ref 26.0–34.0)
MCHC: 31.5 g/dL (ref 30.0–36.0)
MCV: 84.7 fL (ref 80.0–100.0)
Monocytes Absolute: 0.3 10*3/uL (ref 0.1–1.0)
Monocytes Relative: 4 %
Neutro Abs: 4.7 10*3/uL (ref 1.7–7.7)
Neutrophils Relative %: 62 %
Platelet Count: 134 10*3/uL — ABNORMAL LOW (ref 150–400)
RBC: 4.76 MIL/uL (ref 4.22–5.81)
RDW: 19.4 % — ABNORMAL HIGH (ref 11.5–15.5)
WBC Count: 7.5 10*3/uL (ref 4.0–10.5)
nRBC: 0.4 % — ABNORMAL HIGH (ref 0.0–0.2)

## 2021-09-27 LAB — CMP (CANCER CENTER ONLY)
ALT: 46 U/L — ABNORMAL HIGH (ref 0–44)
AST: 35 U/L (ref 15–41)
Albumin: 4 g/dL (ref 3.5–5.0)
Alkaline Phosphatase: 89 U/L (ref 38–126)
Anion gap: 14 (ref 5–15)
BUN: 7 mg/dL (ref 6–20)
CO2: 24 mmol/L (ref 22–32)
Calcium: 8.8 mg/dL — ABNORMAL LOW (ref 8.9–10.3)
Chloride: 98 mmol/L (ref 98–111)
Creatinine: 1.02 mg/dL (ref 0.61–1.24)
GFR, Estimated: >60 mL/min (ref >60)
Glucose, Bld: 434 mg/dL — ABNORMAL HIGH (ref 70–99)
Potassium: 3.6 mmol/L (ref 3.5–5.1)
Sodium: 136 mmol/L (ref 135–145)
Total Bilirubin: 0.7 mg/dL (ref 0.3–1.2)
Total Protein: 7.4 g/dL (ref 6.5–8.1)

## 2021-09-27 MED ORDER — HEPARIN SOD (PORK) LOCK FLUSH 100 UNIT/ML IV SOLN
500.0000 [IU] | Freq: Once | INTRAVENOUS | Status: AC | PRN
  Administered 2021-09-27: 500 [IU]

## 2021-09-27 MED ORDER — SODIUM CHLORIDE 0.9 % IV SOLN
5.0000 mg/kg | Freq: Once | INTRAVENOUS | Status: AC
  Administered 2021-09-27: 500 mg via INTRAVENOUS
  Filled 2021-09-27: qty 16

## 2021-09-27 MED ORDER — SODIUM CHLORIDE 0.9 % IV SOLN: Freq: Once | INTRAVENOUS | Status: AC

## 2021-09-27 MED ORDER — SODIUM CHLORIDE 0.9% FLUSH
10.0000 mL | INTRAVENOUS | Status: DC | PRN
  Administered 2021-09-27: 10 mL

## 2021-09-27 NOTE — Patient Instructions (Signed)
[facility] [facility]   ?Discharge Instructions: ?Thank you for choosing [name] to provide your oncology and hematology care.  ? ?If you have a lab appointment with the [facility], please go directly to the [facility] and check in at the registration area. ?  ?Wear comfortable clothing and clothing appropriate for easy access to any Portacath or PICC line.  ? ?We strive to give you quality time with your provider. You may need to reschedule your appointment if you arrive late (15 or more minutes).  Arriving late affects you and other patients whose appointments are after yours.  Also, if you miss three or more appointments without notifying the office, you may be dismissed from the clinic at the provider?s discretion.    ?  ?For prescription refill requests, have your pharmacy contact our office and allow 72 hours for refills to be completed.   ? ?Today you received the following chemotherapy and/or immunotherapy agents Avastin ?    ?  ?To help prevent nausea and vomiting after your treatment, we encourage you to take your nausea medication as directed. ? ?BELOW ARE SYMPTOMS THAT SHOULD BE REPORTED IMMEDIATELY: ?*FEVER GREATER THAN 100.4 F (38 ?C) OR HIGHER ?*CHILLS OR SWEATING ?*NAUSEA AND VOMITING THAT IS NOT CONTROLLED WITH YOUR NAUSEA MEDICATION ?*UNUSUAL SHORTNESS OF BREATH ?*UNUSUAL BRUISING OR BLEEDING ?*URINARY PROBLEMS (pain or burning when urinating, or frequent urination) ?*BOWEL PROBLEMS (unusual diarrhea, constipation, pain near the anus) ?TENDERNESS IN MOUTH AND THROAT WITH OR WITHOUT PRESENCE OF ULCERS (sore throat, sores in mouth, or a toothache) ?UNUSUAL RASH, SWELLING OR PAIN  ?UNUSUAL VAGINAL DISCHARGE OR ITCHING  ? ?Items with * indicate a potential emergency and should be followed up as soon as possible or go to the Emergency Department if any problems should occur. ? ?Please show the CHEMOTHERAPY ALERT CARD or IMMUNOTHERAPY ALERT CARD at check-in to  the Emergency Department and triage nurse. ? ?Should you have questions after your visit or need to cancel or reschedule your appointment, please contact [facility]  Dept: [phone]  and follow the prompts.  Office hours are 8:00 a.m. to 4:30 p.m. Monday - Friday. Please note that voicemails left after 4:00 p.m. may not be returned until the following business day.  We are closed weekends and major holidays. You have access to a nurse at all times for urgent questions. Please call the main number to the clinic Dept: [phone] and follow the prompts. ? ? ?For any non-urgent questions, you may also contact your provider using MyChart. We now offer e-Visits for anyone 69 and older to request care online for non-urgent symptoms. For details visit [email or web address]. ?  ?Also download the MyChart app! Go to the app store, search "MyChart", open the app, select [facility], and log in with your MyChart username and password. ? ?Due to Covid, a mask is required upon entering the hospital/clinic. If you do not have a mask, one will be given to you upon arrival. For doctor visits, patients may have 1 support person aged 43 or older with them. For treatment visits, patients cannot have anyone with them due to current Covid guidelines and our immunocompromised population.  ? ?Bevacizumab injection ?What is this medication? ?BEVACIZUMAB (be va SIZ yoo mab) is a monoclonal antibody. It is used to treat many types of cancer. ?This medicine may be used for other purposes; ask your health care provider or pharmacist if you have questions. ?COMMON BRAND NAME(S):  Alymsys, Avastin, MVASI, Zirabev ?What should I tell my care team before I take this medication? ?They need to know if you have any of these conditions: ?diabetes ?heart disease ?high blood pressure ?history of coughing up blood ?prior anthracycline chemotherapy (e.g., doxorubicin, daunorubicin, epirubicin) ?recent or ongoing radiation  therapy ?recent or planning to have surgery ?stroke ?an unusual or allergic reaction to bevacizumab, hamster proteins, mouse proteins, other medicines, foods, dyes, or preservatives ?pregnant or trying to get pregnant ?breast-feeding ?How should I use this medication? ?This medicine is for infusion into a vein. It is given by a health care professional in a hospital or clinic setting. ?Talk to your pediatrician regarding the use of this medicine in children. Special care may be needed. ?Overdosage: If you think you have taken too much of this medicine contact a poison control center or emergency room at once. ?NOTE: This medicine is only for you. Do not share this medicine with others. ?What if I miss a dose? ?It is important not to miss your dose. Call your doctor or health care professional if you are unable to keep an appointment. ?What may interact with this medication? ?Interactions are not expected. ?This list may not describe all possible interactions. Give your health care provider a list of all the medicines, herbs, non-prescription drugs, or dietary supplements you use. Also tell them if you smoke, drink alcohol, or use illegal drugs. Some items may interact with your medicine. ?What should I watch for while using this medication? ?Your condition will be monitored carefully while you are receiving this medicine. You will need important blood work and urine testing done while you are taking this medicine. ?This medicine may increase your risk to bruise or bleed. Call your doctor or health care professional if you notice any unusual bleeding. ?Before having surgery, talk to your health care provider to make sure it is ok. This drug can increase the risk of poor healing of your surgical site or wound. You will need to stop this drug for 28 days before surgery. After surgery, wait at least 28 days before restarting this drug. Make sure the surgical site or wound is healed enough before restarting this drug.  Talk to your health care provider if questions. ?Do not become pregnant while taking this medicine or for 6 months after stopping it. Women should inform their doctor if they wish to become pregnant or think they might be pregnant. There is a potential for serious side effects to an unborn child. Talk to your health care professional or pharmacist for more information. Do not breast-feed an infant while taking this medicine and for 6 months after the last dose. ?This medicine has caused ovarian failure in some women. This medicine may interfere with the ability to have a child. You should talk to your doctor or health care professional if you are concerned about your fertility. ?What side effects may I notice from receiving this medication? ?Side effects that you should report to your doctor or health care professional as soon as possible: ?allergic reactions like skin rash, itching or hives, swelling of the face, lips, or tongue ?chest pain or chest tightness ?chills ?coughing up blood ?high fever ?seizures ?severe constipation ?signs and symptoms of bleeding such as bloody or black, tarry stools; red or dark-brown urine; spitting up blood or brown material that looks like coffee grounds; red spots on the skin; unusual bruising or bleeding from the eye, gums, or nose ?signs and symptoms of a blood clot such as  breathing problems; chest pain; severe, sudden headache; pain, swelling, warmth in the leg ?signs and symptoms of a stroke like changes in vision; confusion; trouble speaking or understanding; severe headaches; sudden numbness or weakness of the face, arm or leg; trouble walking; dizziness; loss of balance or coordination ?stomach pain ?sweating ?swelling of legs or ankles ?vomiting ?weight gain ?Side effects that usually do not require medical attention (report to your doctor or health care professional if they continue or are bothersome): ?back pain ?changes in taste ?decreased appetite ?dry  skin ?nausea ?tiredness ?This list may not describe all possible side effects. Call your doctor for medical advice about side effects. You may report side effects to FDA at 1-800-FDA-1088. ?Where should I keep my medica

## 2021-09-27 NOTE — Progress Notes (Signed)
Patient presents for treatment. RN assessment completed along with the following: ? ?Labs/vitals reviewed - Yes, and within treatment parameters.  Per treatment parameters, no need to wait for CMP to result. Urine protein done on 09/13/21 and was negative.  ?Weight within 10% of previous measurement - Yes ?Informed consent completed and reflects current therapy/intent - Yes, on date 09/09/19             ?Provider progress note reviewed - Patient not seen by provider today. Most recent note dated 09/13/21 reviewed. ?Treatment/Antibody/Supportive plan reviewed - Yes, and there are no adjustments needed for today's treatment. ?S&H and other orders reviewed - Yes, and there are no additional orders identified. ?Previous treatment date reviewed - Yes, and the appropriate amount of time has elapsed between treatments. ? ?Patient to proceed with treatment.   ?

## 2021-09-27 NOTE — Patient Instructions (Signed)

## 2021-09-29 ENCOUNTER — Other Ambulatory Visit: Payer: Self-pay | Admitting: Neurology

## 2021-09-30 ENCOUNTER — Other Ambulatory Visit: Payer: Self-pay | Admitting: Neurology

## 2021-10-04 ENCOUNTER — Other Ambulatory Visit (HOSPITAL_COMMUNITY): Payer: Self-pay

## 2021-10-06 ENCOUNTER — Encounter: Payer: Self-pay | Admitting: Neurology

## 2021-10-06 ENCOUNTER — Other Ambulatory Visit (HOSPITAL_COMMUNITY): Payer: Self-pay

## 2021-10-06 ENCOUNTER — Ambulatory Visit: Payer: Medicaid Other | Admitting: Neurology

## 2021-10-06 VITALS — BP 150/98 | HR 104 | Ht 70.0 in | Wt 233.0 lb

## 2021-10-06 DIAGNOSIS — G40309 Generalized idiopathic epilepsy and epileptic syndromes, not intractable, without status epilepticus: Secondary | ICD-10-CM | POA: Diagnosis not present

## 2021-10-06 MED ORDER — LEVETIRACETAM 750 MG PO TABS: ORAL_TABLET | ORAL | 3 refills | Status: DC

## 2021-10-06 NOTE — Patient Instructions (Signed)
Good to see you doing well. Continue Keppra (Levetiracetam) '750mg'$ : take 1 tablet twice a day. Follow-up in 1 year, call for any changes.    Seizure Precautions: 1. If medication has been prescribed for you to prevent seizures, take it exactly as directed.  Do not stop taking the medicine without talking to your doctor first, even if you have not had a seizure in a long time.   2. Avoid activities in which a seizure would cause danger to yourself or to others.  Don't operate dangerous machinery, swim alone, or climb in high or dangerous places, such as on ladders, roofs, or girders.  Do not drive unless your doctor says you may.  3. If you have any warning that you may have a seizure, lay down in a safe place where you can't hurt yourself.    4.  No driving for 6 months from last seizure, as per [facility].   Please refer to the following link on the [place] website for more information: http://[email or web address]/[email or web address]/drivingu.cfm   5.  Maintain good sleep hygiene. Avoid alcohol.  6.  Contact your doctor if you have any problems that may be related to the medicine you are taking.  7.  Call 911 and bring the patient back to the ED if:        A.  The seizure lasts longer than 5 minutes.       B.  The patient doesn't awaken shortly after the seizure  C.  The patient has new problems such as difficulty seeing, speaking or moving  D.  The patient was injured during the seizure  E.  The patient has a temperature over 102 F (39C)  F.  The patient vomited and now is having trouble breathing

## 2021-10-06 NOTE — Progress Notes (Signed)
NEUROLOGY FOLLOW UP OFFICE NOTE  Jeff [name] [id number] [birth year]  HISTORY OF PRESENT ILLNESS: I had the pleasure of seeing Jeff [name] in follow-up in the neurology clinic on 10/06/2021.  The patient was last seen 8 months ago for seizures. He is again accompanied by his wife who helps supplement the history today.  Records and images were personally reviewed where available. Since his last visit, they continue to deny any seizures or seizure-like symptoms since 03/2020 on Levetiracetam '750mg'$  BID, no side effects. No staring/unresponsive episodes, gaps in time, olfactory/gustatory hallucinations, focal numbness/tingling/weakness, myoclonic jerks. No significant headaches, dizziness, vision changes, no falls. He has occasional numbness/tingling in both feet. Mood is good. He sleeps better with a sleeping pill and has also started using a CPAP machine. BP today 155/97, 150/98 on re-check. They report white-coat hypertension.    History on Initial Assessment 11/14/2019: This is a 56 year old right-handed man with a history of hypertension, diabetes, colon cancer on chemotherapy, Graves disease s/p radioactive iodine, PVD, s/p bypass graft, chronic thrombocytopenia, presenting for evaluation of new onset seizure last 10/25/2019. He received chemotherapy then later that evening he lay down at bed then came to the kitchen twice, acting unusually. His wife reports he opened his chemo pouch and was messing with his port. She kept reminding him to stop touching it, he said "yeah," and she asked if it was hurting. He went back to the bedroom and she said good night, then heard a noise and found him unresponsive. He started yelling, both hands were flexed to his face that he scratched his face. He was turned to his right side and confused after. The seizure lasted a few minutes. He had 2 more seizures witnessed by EMS and was given Versed. He was initially brought to [facility] where glucose level was  566, he was hypotensive with SBP in the 80s and transferred to [facility]. He had an MRI brain which did not show any acute changes. His EEG showed intermittent generalized 4-5 Hz theta slowing with frequent spikes over the right frontocentral region, maximal at F4. He was discharged home on Levetiracetam '500mg'$  BID, he denies any side effects. He and his wife deny any prior history of seizures but recall 2 episodes of loss of consciousness, he was in thyroid storm one time. Another time 11 years ago he lost his memory, he was going to [facility] but ended up going to [place], he passed out and then apparently walked out of the hospital, they found him on a train track. His ammonia level was high that time. She denies any staring/unresponsive episodes. He denies any gaps in time. Sometimes he smells Clorox or hand sanitizer. He has been very sensitive to smells, smelling Clorox since diagnosed with cancer. He has hypnic jerks in sleep that wake him up, none during the day.   Since the seizure, he has had bilateral shoulder/arm pain, with sharp pains when he tries to lift them up. His glucose levels are better with insulin dose changes. He has paresthesias in both feet. He has occasional headaches but has been complaining of them more recently, with pain over the temples. He has nausea/vomiting with chemotherapy. He has occasional sensation of food getting stuck in his throat. He has chronic back pain. No bowel/bladder dysfunction. Memory is okay. He denies any alcohol use. He has sleep difficulties, melatonin does not help. He had a normal birth and early development.  There is no history of febrile convulsions, CNS infections  such as meningitis/encephalitis, significant traumatic brain injury, neurosurgical procedures, or family history of seizures.  Diagnostic Data: MRI brain in 10/2019 did not show any acute changes.  EEG in 10/2019 showed intermittent generalized 4-5 Hz theta slowing with frequent spikes over  the right frontocentral region, maximal at F4 24-hour EEG in 04/2020 was abnormal with occasional generalized 4-5 Hz spike and wave discharges consistent with a primary generalized epilepsy.   PAST MEDICAL HISTORY: Past Medical History:  Diagnosis Date   Allergic rhinitis    Anxiety    Arthritis    Chemotherapy-induced thrombocytopenia    Chronic nausea    mild intermittant and loose stools due to colon cancer   Chronic neck and back pain    Depression    ED (erectile dysfunction)    Generalized idiopathic epilepsy and epileptic syndromes, not intractable, without status epilepticus ([place]) 10/25/2019   (11-10-2020 per pt wife last seizure 11/ 2021)  neurologist-- dr Delice Lesch---  seizure activity/ DKA ;  EEG w/ evidence epileto genicity arising right frontocentral region;  taking keppra   GERD (gastroesophageal reflux disease)    Hiatal hernia    History of 2019 novel coronavirus disease (COVID-19) 12/13/2018   per pt wife , pt had moderate symtptoms that resolved   History of Graves' disease    s/p RAI 02/ 2012;  previous seen by endocrinologist--- dr Loanne Drilling   History of kidney stones    History of non-ST elevation myocardial infarction (NSTEMI)    Jan 2014--  no CAD;  per notes probable coronary vasospasm   History of panic attacks    History of thyroid storm    Nov 2011 w/ grave's ,  s/p RAI 02/ 2012   Hyperlipidemia    Hypertension    Hypothyroidism following radioiodine therapy    RAI in 02/  2012---  previously followed by dr Loanne Drilling,  now followed by pcp   Lower urinary tract symptoms (LUTS)    Metastatic colon cancer to liver ([place]) 11/2015   oncologist-- [facility]/ and seen by dr a. [name];  01-14-2016 s/p sigmoid colectomy, liver bx, node dissection, PAC;   chemo started 09/ 2017  ;  04/ 2021 metastatic pulmonary nodules;   active treatment every 2 wks   Nephrolithiasis    left side non-obstructive per imaging 04/ 2022   PAD (peripheral artery disease) [facility]) cardiologist-   dr Fletcher Anon   cardiologist ----  dr Jerilynn Mages. Fletcher Anon ;   left popliteal occlusion behind knee w/ collaterals;  s/p attempted angioplasty 01/ 2016;  09-11-2015 s/p pop-TPT w/ ipsNR GSV   PONV (postoperative nausea and vomiting)    Rash    due to chemo   Type 2 diabetes mellitus treated with insulin ([place])    followed by pcp----   (11-10-2020  per pt wife blood sugar check's 2-4 times daily,  fasting sugar--- 130--168)   Unspecified fracture of left foot, initial encounter for closed fracture 10/17/2020   midfoot    MEDICATIONS: Current Outpatient Medications on File Prior to Visit  Medication Sig Dispense Refill   ACCU-CHEK GUIDE test strip CHECK BLOOD SUGAR THREE TIMES DAILY AS DIRECTED     ACCU-CHEK SOFTCLIX LANCETS lancets USE  TID TO CHECK BLOOD SUGAR LEVELS  0   amLODipine (NORVASC) 10 MG tablet Take 10 mg by mouth daily.     aspirin EC 81 MG tablet Take 81 mg by mouth daily.     atorvastatin (LIPITOR) 80 MG tablet TAKE 1 TABLET(80 MG) BY MOUTH AT BEDTIME 60 tablet  1   clonazePAM (KLONOPIN) 1 MG tablet Take 1 mg by mouth 2 (two) times daily as needed.     dapagliflozin propanediol (FARXIGA) 10 MG TABS tablet Take 10 mg by mouth daily.     diphenoxylate-atropine (LOMOTIL) 2.5-0.025 MG tablet Take 2 tablets by mouth 4 (four) times daily as needed for diarrhea or loose stools. 40 tablet 0   docusate sodium (COLACE) 100 MG capsule Take 100 mg by mouth 2 (two) times daily.     GLOBAL EASE INJECT PEN NEEDLES 31G X 5 MM MISC Inject 1 Syringe into the skin 4 (four) times daily.     insulin aspart (NOVOLOG) 100 UNIT/ML injection Inject 12 Units into the skin 3 (three) times daily before meals. (Patient taking differently: Inject 18 Units into the skin 3 (three) times daily before meals.) 12 mL 0   Insulin Degludec (TRESIBA FLEXTOUCH [employer]) Inject 96 Units into the skin daily.     levETIRAcetam (KEPPRA) 750 MG tablet TAKE 1 TABLET(750 MG) BY MOUTH TWICE DAILY 60 tablet 0   levothyroxine (SYNTHROID) 150 MCG  tablet Take 150 mcg by mouth daily.     lidocaine-prilocaine (EMLA) cream Apply 1 application topically as needed. Apply to portacath site 1 hour prior to use 30 g 3   LORazepam (ATIVAN) 0.5 MG tablet Take 1 tablet (0.5 mg total) by mouth every 8 (eight) hours as needed for anxiety. 30 tablet 0   meclizine (ANTIVERT) 25 MG tablet Take 25 mg by mouth 3 (three) times daily as needed for dizziness.  1   metFORMIN (GLUCOPHAGE-XR) 500 MG 24 hr tablet Take 1,000 mg by mouth in the morning and at bedtime.  3   metoprolol tartrate (LOPRESSOR) 50 MG tablet TAKE 1 TABLET(50 MG) BY MOUTH TWICE DAILY 180 tablet 3   mirtazapine (REMERON) 15 MG tablet Take 15 mg by mouth at bedtime.     nitroGLYCERIN (NITROSTAT) 0.4 MG SL tablet DISSOLVE 1 TABLET UNDER TONGUE EVERY 5 MINUTES AS NEEDED FOR CHEST PAIN. IF NO RELIEF AFTER 3 DOSES CALL 911. 25 tablet 5   ondansetron (ZOFRAN) 8 MG tablet Take 1 tablet (8 mg total) by mouth every 8 (eight) hours as needed for nausea or vomiting. Begin 72 hours after chemo treatment 30 tablet 1   oxyCODONE-acetaminophen (PERCOCET) 10-325 MG tablet Take 1 tablet by mouth every 6 (six) hours as needed.     pantoprazole (PROTONIX) 40 MG tablet Take 40 mg by mouth 2 (two) times daily.  5   promethazine (PHENERGAN) 12.5 MG tablet Take 1 tablet (12.5 mg total) by mouth every 6 (six) hours as needed. 30 tablet 2   sildenafil (VIAGRA) 100 MG tablet Take 100 mg by mouth daily as needed.     tamsulosin (FLOMAX) 0.4 MG CAPS capsule Take 0.4 mg by mouth 2 (two) times daily.     tiZANidine (ZANAFLEX) 4 MG tablet Take 4 mg by mouth 3 (three) times daily.     trifluridine-tipiracil (LONSURF) 20-8.19 MG tablet Take 3 tablets (60 mg of trifluridine total) by mouth 2 (two) times daily after a meal. Take 1 hr after AM & PM meals on days 1-5, 8-12. Repeat every 28 day. Start cycle on 09/13/2021 60 tablet 0   venlafaxine XR (EFFEXOR-XR) 75 MG 24 hr capsule Take 225 mg by mouth daily.     Current  Facility-Administered Medications on File Prior to Visit  Medication Dose Route Frequency Provider Last Rate Last Admin   alteplase (CATHFLO ACTIVASE) injection 2 mg  2 mg Intracatheter Once PRN Ladell Pier, MD       sodium chloride flush (NS) 0.9 % injection 10 mL  10 mL Intravenous PRN Ladell Pier, MD   10 mL at 02/04/16 1431   sodium chloride flush (NS) 0.9 % injection 10 mL  10 mL Intravenous PRN Ladell Pier, MD   10 mL at 10/18/16 7741   sodium chloride flush (NS) 0.9 % injection 10 mL  10 mL Intravenous PRN Ladell Pier, MD   10 mL at 05/03/17 0855    ALLERGIES: Allergies  Allergen Reactions   Hydrocodone Hives    FAMILY HISTORY: Family History  Problem Relation Age of Onset   Thyroid disease Mother        hypothyroidism   Heart attack Maternal Grandfather    Heart Problems Father        pacermaker   Edema Father    Heart disease Maternal Grandmother    Lung cancer Maternal Grandmother    Diabetes Maternal Grandmother    Hypertension Maternal Grandmother     SOCIAL HISTORY: Social History   Socioeconomic History   Marital status: Married    Spouse name: Not on file   Number of children: 5   Years of education: Not on file   Highest education level: Not on file  Occupational History   Occupation: Disabled    Employer: UNEMPLOYED  Tobacco Use   Smoking status: Never   Smokeless tobacco: Never  Vaping Use   Vaping Use: Never used  Substance and Sexual Activity   Alcohol use: Yes    Comment: RARE   Drug use: Never   Sexual activity: Not on file  Other Topics Concern   Not on file  Social History Narrative   Married - wife works Surveyor, quantity unit @ [employer]   5 daughters   Regular exercise-no   Disabled to due back problem   12/04/2015   Right handed    Social Determinants of Health   Financial Resource Strain: Not on file  Food Insecurity: Not on file  Transportation Needs: Not on file  Physical Activity: Not on file  Stress: Not on  file  Social Connections: Not on file  Intimate Partner Violence: Not on file     PHYSICAL EXAM: Vitals:   10/06/21 0810 10/06/21 0850  BP: (!) 155/97 (!) 150/98  Pulse: (!) 104   SpO2: 99%    General: No acute distress Head:  Normocephalic/atraumatic Skin/Extremities: No rash, no edema Neurological Exam: alert and awake. No aphasia or dysarthria. Fund of knowledge is appropriate.  Attention and concentration are normal.   Cranial nerves: Pupils equal, round. Extraocular movements intact with no nystagmus. Visual fields full.  No facial asymmetry.  Motor: Bulk and tone normal, muscle strength 5/5 throughout with no pronator drift.   Finger to nose testing intact.  Gait narrow-based and steady, able to tandem walk adequately.  Romberg negative.   IMPRESSION: This is a pleasant 56 yo RH man with a history of hypertension, diabetes, colon cancer on chemotherapy, Graves disease s/p radioactive iodine, PVD, s/p bypass graft, chronic thrombocytopenia, with new onset seizure in June 2021. Glucose levels were significantly high at that time, his EEG was abnormal with frequent spikes over the right frontocentral region, maximal at F4. MRI brain unremarkable. He had another seizure in November 2021. His 24-hour EEG showed generalized 4-5 Hz spike and wave discharges consistent with a primary generalized epilepsy. He continues to do well seizure-free since November  2021 on Levetiracetam '750mg'$  BID, no side effects. Refills sent. He is aware of [place] driving laws to stop driving after a seizure until 6 months seizure-free. Follow-up in 1 year, call for any changes.    Thank you for allowing me to participate in his care.  Please do not hesitate to call for any questions or concerns.    Ellouise Newer, M.D.   CC: Bobbye Riggs, NP

## 2021-10-07 ENCOUNTER — Other Ambulatory Visit: Payer: Self-pay | Admitting: *Deleted

## 2021-10-07 ENCOUNTER — Telehealth: Payer: Self-pay

## 2021-10-07 ENCOUNTER — Other Ambulatory Visit (HOSPITAL_COMMUNITY): Payer: Self-pay

## 2021-10-07 DIAGNOSIS — C189 Malignant neoplasm of colon, unspecified: Secondary | ICD-10-CM

## 2021-10-07 MED ORDER — LONSURF 20-8.19 MG PO TABS
60.0000 mg | ORAL_TABLET | Freq: Two times a day (BID) | ORAL | 0 refills | Status: DC
  Filled 2021-10-07: qty 60, 28d supply, fill #0

## 2021-10-07 NOTE — Telephone Encounter (Signed)
Patient had called for refill on Lonsurf that is due to resume on 10/11/21. Refill approved and patient instructed to await starting cycle to 5/30 after seeing provider.

## 2021-10-07 NOTE — Telephone Encounter (Signed)
Patient 's wife called in and had a question about the patient's Lonsurf. She wanted to know if he was going to start on  Monday 10/11/21 on a new cycle of Lonsurf. I advice the patient's wife, he will need lab before the patient start a new cycle of his Lonsurf. The patient is schedule to come in on 10/12/21. The wife gave verbal understanding.

## 2021-10-08 ENCOUNTER — Other Ambulatory Visit (HOSPITAL_COMMUNITY): Payer: Self-pay

## 2021-10-11 ENCOUNTER — Other Ambulatory Visit: Payer: Self-pay | Admitting: Oncology

## 2021-10-12 ENCOUNTER — Inpatient Hospital Stay: Payer: Medicaid Other

## 2021-10-12 ENCOUNTER — Other Ambulatory Visit (HOSPITAL_COMMUNITY): Payer: Self-pay

## 2021-10-12 ENCOUNTER — Encounter: Payer: Self-pay | Admitting: *Deleted

## 2021-10-12 ENCOUNTER — Telehealth: Payer: Self-pay

## 2021-10-12 ENCOUNTER — Inpatient Hospital Stay: Payer: Medicaid Other | Admitting: Oncology

## 2021-10-12 VITALS — BP 147/98 | HR 98

## 2021-10-12 VITALS — BP 150/90 | HR 92 | Temp 98.2°F | Resp 18 | Ht 70.0 in | Wt 230.0 lb

## 2021-10-12 DIAGNOSIS — C187 Malignant neoplasm of sigmoid colon: Secondary | ICD-10-CM

## 2021-10-12 DIAGNOSIS — C189 Malignant neoplasm of colon, unspecified: Secondary | ICD-10-CM

## 2021-10-12 DIAGNOSIS — Z5112 Encounter for antineoplastic immunotherapy: Secondary | ICD-10-CM | POA: Diagnosis not present

## 2021-10-12 LAB — CBC WITH DIFFERENTIAL (CANCER CENTER ONLY)
Abs Immature Granulocytes: 0.01 10*3/uL (ref 0.00–0.07)
Basophils Absolute: 0 10*3/uL (ref 0.0–0.1)
Basophils Relative: 0 %
Eosinophils Absolute: 0.2 10*3/uL (ref 0.0–0.5)
Eosinophils Relative: 3 %
HCT: 41 % (ref 39.0–52.0)
Hemoglobin: 12.6 g/dL — ABNORMAL LOW (ref 13.0–17.0)
Immature Granulocytes: 0 %
Lymphocytes Relative: 40 %
Lymphs Abs: 2.1 10*3/uL (ref 0.7–4.0)
MCH: 26.5 pg (ref 26.0–34.0)
MCHC: 30.7 g/dL (ref 30.0–36.0)
MCV: 86.3 fL (ref 80.0–100.0)
Monocytes Absolute: 0.5 10*3/uL (ref 0.1–1.0)
Monocytes Relative: 10 %
Neutro Abs: 2.4 10*3/uL (ref 1.7–7.7)
Neutrophils Relative %: 47 %
Platelet Count: 162 10*3/uL (ref 150–400)
RBC: 4.75 MIL/uL (ref 4.22–5.81)
RDW: 18.6 % — ABNORMAL HIGH (ref 11.5–15.5)
WBC Count: 5.2 10*3/uL (ref 4.0–10.5)
nRBC: 0 % (ref 0.0–0.2)

## 2021-10-12 LAB — CEA (ACCESS): CEA (CHCC): 59.15 ng/mL — ABNORMAL HIGH (ref 0.00–5.00)

## 2021-10-12 LAB — CMP (CANCER CENTER ONLY)
ALT: 41 U/L (ref 0–44)
AST: 34 U/L (ref 15–41)
Albumin: 4.3 g/dL (ref 3.5–5.0)
Alkaline Phosphatase: 84 U/L (ref 38–126)
Anion gap: 13 (ref 5–15)
BUN: 7 mg/dL (ref 6–20)
CO2: 24 mmol/L (ref 22–32)
Calcium: 8.8 mg/dL — ABNORMAL LOW (ref 8.9–10.3)
Chloride: 100 mmol/L (ref 98–111)
Creatinine: 1.01 mg/dL (ref 0.61–1.24)
GFR, Estimated: >60 mL/min (ref >60)
Glucose, Bld: 321 mg/dL — ABNORMAL HIGH (ref 70–99)
Potassium: 3.4 mmol/L — ABNORMAL LOW (ref 3.5–5.1)
Sodium: 137 mmol/L (ref 135–145)
Total Bilirubin: 0.6 mg/dL (ref 0.3–1.2)
Total Protein: 7.7 g/dL (ref 6.5–8.1)

## 2021-10-12 LAB — TOTAL PROTEIN, URINE DIPSTICK: Protein, ur: NEGATIVE mg/dL

## 2021-10-12 MED ORDER — SODIUM CHLORIDE 0.9 % IV SOLN
5.0000 mg/kg | Freq: Once | INTRAVENOUS | Status: AC
  Administered 2021-10-12: 500 mg via INTRAVENOUS
  Filled 2021-10-12: qty 16

## 2021-10-12 MED ORDER — SODIUM CHLORIDE 0.9% FLUSH
10.0000 mL | INTRAVENOUS | Status: DC | PRN
  Administered 2021-10-12: 10 mL

## 2021-10-12 MED ORDER — SODIUM CHLORIDE 0.9 % IV SOLN: Freq: Once | INTRAVENOUS | Status: AC

## 2021-10-12 MED ORDER — HEPARIN SOD (PORK) LOCK FLUSH 100 UNIT/ML IV SOLN
500.0000 [IU] | Freq: Once | INTRAVENOUS | Status: AC | PRN
  Administered 2021-10-12: 500 [IU]

## 2021-10-12 NOTE — Patient Instructions (Signed)
[facility]   Discharge Instructions: Thank you for choosing [facility] to provide your oncology and hematology care.   If you have a lab appointment with the [facility], please go directly to the [facility] and check in at the registration area.   Wear comfortable clothing and clothing appropriate for easy access to any Portacath or PICC line.   We strive to give you quality time with your provider. You may need to reschedule your appointment if you arrive late (15 or more minutes).  Arriving late affects you and other patients whose appointments are after yours.  Also, if you miss three or more appointments without notifying the office, you may be dismissed from the clinic at the provider's discretion.      For prescription refill requests, have your pharmacy contact our office and allow 72 hours for refills to be completed.    Today you received the following chemotherapy and/or immunotherapy agents Mvasi       To help prevent nausea and vomiting after your treatment, we encourage you to take your nausea medication as directed.  BELOW ARE SYMPTOMS THAT SHOULD BE REPORTED IMMEDIATELY: *FEVER GREATER THAN 100.4 F (38 C) OR HIGHER *CHILLS OR SWEATING *NAUSEA AND VOMITING THAT IS NOT CONTROLLED WITH YOUR NAUSEA MEDICATION *UNUSUAL SHORTNESS OF BREATH *UNUSUAL BRUISING OR BLEEDING *URINARY PROBLEMS (pain or burning when urinating, or frequent urination) *BOWEL PROBLEMS (unusual diarrhea, constipation, pain near the anus) TENDERNESS IN MOUTH AND THROAT WITH OR WITHOUT PRESENCE OF ULCERS (sore throat, sores in mouth, or a toothache) UNUSUAL RASH, SWELLING OR PAIN  UNUSUAL VAGINAL DISCHARGE OR ITCHING   Items with * indicate a potential emergency and should be followed up as soon as possible or go to the Emergency Department if any problems should occur.  Please show the CHEMOTHERAPY ALERT CARD or IMMUNOTHERAPY ALERT CARD at check-in to the  Emergency Department and triage nurse.  Should you have questions after your visit or need to cancel or reschedule your appointment, please contact [facility]  Dept: [phone]  and follow the prompts.  Office hours are 8:00 a.m. to 4:30 p.m. Monday - Friday. Please note that voicemails left after 4:00 p.m. may not be returned until the following business day.  We are closed weekends and major holidays. You have access to a nurse at all times for urgent questions. Please call the main number to the clinic Dept: [phone] and follow the prompts.   For any non-urgent questions, you may also contact your provider using MyChart. We now offer e-Visits for anyone 29 and older to request care online for non-urgent symptoms. For details visit [email or web address].   Also download the MyChart app! Go to the app store, search "MyChart", open the app, select [facility], and log in with your MyChart username and password.  Due to Covid, a mask is required upon entering the hospital/clinic. If you do not have a mask, one will be given to you upon arrival. For doctor visits, patients may have 1 support person aged 57 or older with them. For treatment visits, patients cannot have anyone with them due to cur Bevacizumab injection What is this medication? BEVACIZUMAB (be va SIZ yoo mab) is a monoclonal antibody. It is used to treat many types of cancer. This medicine may be used for other purposes; ask your health care provider or pharmacist if you have questions. COMMON BRAND NAME(S): Alymsys, Avastin, MVASI, Zirabev What should I tell  my care team before I take this medication? They need to know if you have any of these conditions: diabetes heart disease high blood pressure history of coughing up blood prior anthracycline chemotherapy (e.g., doxorubicin, daunorubicin, epirubicin) recent or ongoing radiation therapy recent or planning to have surgery stroke an unusual or  allergic reaction to bevacizumab, hamster proteins, mouse proteins, other medicines, foods, dyes, or preservatives pregnant or trying to get pregnant breast-feeding How should I use this medication? This medicine is for infusion into a vein. It is given by a health care professional in a hospital or clinic setting. Talk to your pediatrician regarding the use of this medicine in children. Special care may be needed. Overdosage: If you think you have taken too much of this medicine contact a poison control center or emergency room at once. NOTE: This medicine is only for you. Do not share this medicine with others. What if I miss a dose? It is important not to miss your dose. Call your doctor or health care professional if you are unable to keep an appointment. What may interact with this medication? Interactions are not expected. This list may not describe all possible interactions. Give your health care provider a list of all the medicines, herbs, non-prescription drugs, or dietary supplements you use. Also tell them if you smoke, drink alcohol, or use illegal drugs. Some items may interact with your medicine. What should I watch for while using this medication? Your condition will be monitored carefully while you are receiving this medicine. You will need important blood work and urine testing done while you are taking this medicine. This medicine may increase your risk to bruise or bleed. Call your doctor or health care professional if you notice any unusual bleeding. Before having surgery, talk to your health care provider to make sure it is ok. This drug can increase the risk of poor healing of your surgical site or wound. You will need to stop this drug for 28 days before surgery. After surgery, wait at least 28 days before restarting this drug. Make sure the surgical site or wound is healed enough before restarting this drug. Talk to your health care provider if questions. Do not become  pregnant while taking this medicine or for 6 months after stopping it. Women should inform their doctor if they wish to become pregnant or think they might be pregnant. There is a potential for serious side effects to an unborn child. Talk to your health care professional or pharmacist for more information. Do not breast-feed an infant while taking this medicine and for 6 months after the last dose. This medicine has caused ovarian failure in some women. This medicine may interfere with the ability to have a child. You should talk to your doctor or health care professional if you are concerned about your fertility. What side effects may I notice from receiving this medication? Side effects that you should report to your doctor or health care professional as soon as possible: allergic reactions like skin rash, itching or hives, swelling of the face, lips, or tongue chest pain or chest tightness chills coughing up blood high fever seizures severe constipation signs and symptoms of bleeding such as bloody or black, tarry stools; red or dark-brown urine; spitting up blood or brown material that looks like coffee grounds; red spots on the skin; unusual bruising or bleeding from the eye, gums, or nose signs and symptoms of a blood clot such as breathing problems; chest pain; severe, sudden headache; pain,  swelling, warmth in the leg signs and symptoms of a stroke like changes in vision; confusion; trouble speaking or understanding; severe headaches; sudden numbness or weakness of the face, arm or leg; trouble walking; dizziness; loss of balance or coordination stomach pain sweating swelling of legs or ankles vomiting weight gain Side effects that usually do not require medical attention (report to your doctor or health care professional if they continue or are bothersome): back pain changes in taste decreased appetite dry skin nausea tiredness This list may not describe all possible side effects.  Call your doctor for medical advice about side effects. You may report side effects to FDA at 1-800-FDA-1088. Where should I keep my medication? This drug is given in a hospital or clinic and will not be stored at home. NOTE: This sheet is a summary. It may not cover all possible information. If you have questions about this medicine, talk to your doctor, pharmacist, or health care provider.  2023 Elsevier/Gold Standard (2021-04-02 00:00:00)

## 2021-10-12 NOTE — Telephone Encounter (Signed)
TC to Pt spoke with [name] who stated she would relay message to Pt. [name] verbalized understanding.

## 2021-10-12 NOTE — Telephone Encounter (Signed)
-----   Message from Ladell Pier, MD sent at 10/12/2021  5:26 PM EDT ----- Please call patient, the CEA is higher again today, schedule noncontrast chest CT a few days prior to the next office visit

## 2021-10-12 NOTE — Progress Notes (Signed)
Patient seen by Dr. Sherrill today ? ?Vitals are within treatment parameters. ? ?Labs reviewed by Dr. Sherrill and are within treatment parameters. ? ?Per physician team, patient is ready for treatment and there are NO modifications to the treatment plan.  ?

## 2021-10-12 NOTE — Progress Notes (Signed)
Jeff [name] OFFICE PROGRESS NOTE   Diagnosis: Colon cancer  INTERVAL HISTORY:   Jeff [name] returns as scheduled.  He completed another cycle of Lonsurf/bevacizumab beginning 09/13/2021.  No nausea, diarrhea, or rash.  No bleeding or symptom of thrombosis.  He feels well.  No complaint.  Objective:  Vital signs in last 24 hours:  Blood pressure (!) 150/90, pulse 92, temperature 98.2 F (36.8 C), temperature source Oral, resp. rate 18, height $RemoveBe'5\' 10"'WtNMhbZoz$  (1.778 m), weight 230 lb (104.3 kg), SpO2 99 %.    HEENT: No thrush or ulcers Resp: Lungs clear bilaterally Cardio: Regular rate and rhythm GI: No hepatosplenomegaly Vascular: No leg edema     Portacath/PICC-without erythema  Lab Results:  Lab Results  Component Value Date   WBC 5.2 10/12/2021   HGB 12.6 (L) 10/12/2021   HCT 41.0 10/12/2021   MCV 86.3 10/12/2021   PLT 162 10/12/2021   NEUTROABS 2.4 10/12/2021    CMP  Lab Results  Component Value Date   NA 137 10/12/2021   K 3.4 (L) 10/12/2021   CL 100 10/12/2021   CO2 24 10/12/2021   GLUCOSE 321 (H) 10/12/2021   BUN 7 10/12/2021   CREATININE 1.01 10/12/2021   CALCIUM 8.8 (L) 10/12/2021   PROT 7.7 10/12/2021   ALBUMIN 4.3 10/12/2021   AST 34 10/12/2021   ALT 41 10/12/2021   ALKPHOS 84 10/12/2021   BILITOT 0.6 10/12/2021   GFRNONAA >60 10/12/2021   GFRAA >60 01/30/2020    Lab Results  Component Value Date   CEA1 3.19 09/10/2020   CEA 43.93 (H) 09/13/2021     Medications: I have reviewed the patient's current medications.   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, biopsy confirmed adenocarcinoma         CTs chest, abdomen, and pelvis on 12/11/2015-indeterminate tiny pulmonary nodules, multiple liver metastases, small nodes in the sigmoid mesocolon Laparoscopic sigmoid colectomy, liver biopsy, Port-A-Cath placement 01/14/2016 Pathology sigmoid colon resection- colonic  adenocarcinoma, 5.3 cm extending into pericolonic connective tissue, positive lymph-vascular invasion, positive perineural invasion, negative margins, metastatic carcinoma in 9 of 28 lymph nodes Pathology liver biopsy-metastatic colorectal adenocarcinoma MSI stable; mismatch repair protein normal APC alteration, K RAS wild-type, no BRAF mutation Cycle 1 FOLFOX 02/02/2016 Cycle 2 FOLFOX 02/15/2016 Cycle 3 FOLFOX 02/29/2016 Cycle 4 FOLFOX 03/14/2016 Cycle 5 FOLFOX 03/28/2016 Cycle 6 FOLFOX 04/11/2016 (oxaliplatin held secondary to thrombocytopenia) 04/21/2016 restaging CTs-difficulty evaluating liver lesions due to hepatic steatosis. Stable right adrenal nodule. No adenopathy or local recurrence near the rectosigmoid anastomotic site. Cycle 7 FOLFOX 04/25/2016 MRI liver 05/02/2016-partial improvement in hepatic metastases Cycle 8 FOLFOX 05/10/2016 Cycle 9 FOLFOX 05/23/2016 (oxaliplatin held due to thrombocytopenia) Cycle 10 FOLFOX 06/06/2016 (oxaliplatin dose reduced due to thrombocytopenia) Cycle 11 FOLFOX 06/27/2016 (oxaliplatin held due to neuropathy) Cycle 12 FOLFOX 07/11/2016 (oxaliplatin held) Initiation of maintenance Xeloda 7 days on/7 days off 07/27/2016 MRI liver 11/18/2016-decrease in hepatic metastatic disease. No new or progressive disease identified within the abdomen. Continuation of Xeloda 7 days on/7 days off MRI liver 04/27/2017-previous liver lesions not identified, no new lesions, no lymphadenopathy Xeloda continued 7 days on/7 days off MRI liver 12/04/2017 - no evidence of metastatic disease, hepatic steatosis Xeloda continued 7 days on/7 days off MRI liver 07/15/2018- no evidence of metastatic disease.  Stable severe hepatic steatosis. Xeloda continued 7 days on/7 days off MRI liver 03/16/2019-hepatic steatosis, no liver mass, focal area of intrahepatic biliary dilatation in segments 2  and 3 of the left lobe-increased Xeloda continued 7 days on/7 days off MRI abdomen  08/19/2019-no findings to suggest liver metastases.  Bilateral lung nodules measuring up to 2.8 cm, progressive and more conspicuous than on previous exam CT chest 08/29/2019-multiple pulmonary metastases, new from 04/21/2016 Cycle 1 FOLFIRI/bevacizumab 09/09/2019 Cycle 2 FOLFIRI/bevacizumab 09/26/2019  Cycle 3 FOLFIRI/bevacizumab 10/10/2019 Cycle 4 FOLFIRI/bevacizumab 10/24/2019 Cycle 5 FOLFIRI/bevacizumab 11/07/2019 CT chest 11/14/2019-decreased size of lung nodules, no new lesions, hepatic steatosis Cycle 6 FOLFIRI/bevacizumab 11/21/2019 Cycle 7 FOLFIRI/bevacizumab 12/05/2019 Cycle 8 FOLFIRI/bevacizumab 12/19/2019 Cycle 9 FOLFIRI/bevacizumab 01/02/2020 Cycle 10 FOLFIRI/bevacizumab 01/16/2020 CT chest 01/29/2020-stable bilateral pulmonary metastases.  No new or progressive metastatic disease in the chest. Cycle 11 FOLFIRI/bevacizumab 01/30/2020 Cycle 12 FOLFIRI/bevacizumab 02/19/2020 Cycle 13 FOLFIRI/bevacizumab 03/12/2020 Cycle 14 FOLFIRI/bevacizumab 04/02/2020 Cycle 15 FOLFIRI/bevacizumab 04/23/2020 Cycle 16 FOLFIRI/bevacizumab 05/21/2020 CT chest 06/09/2020-mild progression pulmonary metastasis.  Some lesions have increased in size while others are similar. Cycle 1 irinotecan/Panitumumab 06/18/2020 Cycle 2 irinotecan/Panitumumab 07/02/2020 Cycle 3 irinotecan/panitumumab 07/16/2020 Cycle 4 irinotecan/Panitumumab 07/30/2020 Cycle 5 irinotecan/Panitumumab 08/13/2020, Emend added Cycle 6 irinotecan/Panitumumab 08/27/2020 CT chest 09/08/2020-decreased size of pulmonary nodules, no progressive disease Cycle 7 irinotecan/panitumumab 09/02/2020 Cycle 8 irinotecan/panitumumab 09/24/2020 Cycle 9 irinotecan/Panitumumab 10/08/2020 10/22/2020 treatment held due to left foot fracture, need for surgery Cycle 10 irinotecan/panitumumab 11/24/2020 Cycle 11 irinotecan/Panitumumab 12/08/2020 Cycle 12 irinotecan/panitumumab 12/22/2020 Cycle 13 irinotecan/panitumumab 01/05/2021 01/20/2021 CT chest-mixed response with minimal increase in size  of some lesions and minimal decrease in the size of other lesions.  Overall number of lesions is unchanged. Cycle 14 irinotecan/Panitumumab 02/05/2021 Cycle 15 irinotecan/Panitumumab 02/23/2021 Cycle 16 irinotecan/panitumumab 03/16/2021 Cycle 17 irinotecan/Panitumumab 04/13/2021 05/03/2021-CT chest-enlargement of pulmonary metastases, no new lesions 06/21/2021-cycle 1 Lonsurf/bevacizumab 07/19/2021-cycle 2 Lonsurf/bevacizumab 08/16/2021-cycle 3 Lonsurf/bevacizumab 09/09/2021 CT chest-bilateral lung nodules and masses with mixed response, overall stable to very minimally increased 09/13/2021 cycle 4 Lonsurf/bevacizumab 10/12/2021 cycle 5 of Lonsurf/bevacizumab   2.   Rectal bleeding and constipation secondary to #1   3.   History of peripheral vascular disease, status post left lower extremity vascular bypass surgery in April 2017   4.   History of nephrolithiasis   5.   History of Graves' disease treated with radioactive iodine   6.   Anxiety/depression   7.   Hypertension   8.   Hospitalization 01/19/2016 with wound dehiscence status post secondary suture closure of abdominal wall   9.   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 Hyperglycemia 06/20/2016-diagnosed with diabetes, maintained on insulin   11.  Positive COVID test 12/13/2018; positive COVID test 01/25/2021   12. Hospitalized with seizure activity/DKA. Now on Keppra, insulin adjusted. 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  Left foot fracture-surgical repair 11/11/2020     Disposition: Jeff [name] appears stable.  He is tolerating the Lonsurf/bevacizumab well.  He will complete another cycle beginning today.  We will follow-up on the CEA from today.  If the CEA is  significantly higher we will schedule a restaging chest CT prior to the next office visit.  He will return for bevacizumab in 2 weeks.  He will be scheduled for an office visit in 4 weeks.  Betsy Coder, MD  10/12/2021  11:32 AM

## 2021-10-13 ENCOUNTER — Other Ambulatory Visit: Payer: Self-pay

## 2021-10-13 DIAGNOSIS — C189 Malignant neoplasm of colon, unspecified: Secondary | ICD-10-CM

## 2021-10-14 ENCOUNTER — Other Ambulatory Visit (HOSPITAL_COMMUNITY): Payer: Self-pay

## 2021-10-19 ENCOUNTER — Other Ambulatory Visit: Payer: Self-pay

## 2021-10-19 DIAGNOSIS — C189 Malignant neoplasm of colon, unspecified: Secondary | ICD-10-CM

## 2021-10-21 ENCOUNTER — Ambulatory Visit (HOSPITAL_BASED_OUTPATIENT_CLINIC_OR_DEPARTMENT_OTHER)
Admission: RE | Admit: 2021-10-21 | Discharge: 2021-10-21 | Disposition: A | Discharge status: Home / Self Care | Payer: Medicaid Other | Source: Ambulatory Visit | Attending: Oncology | Admitting: Oncology

## 2021-10-21 DIAGNOSIS — C189 Malignant neoplasm of colon, unspecified: Secondary | ICD-10-CM | POA: Diagnosis not present

## 2021-10-21 DIAGNOSIS — C787 Secondary malignant neoplasm of liver and intrahepatic bile duct: Secondary | ICD-10-CM | POA: Diagnosis present

## 2021-10-24 ENCOUNTER — Other Ambulatory Visit: Payer: Self-pay | Admitting: Oncology

## 2021-10-25 ENCOUNTER — Inpatient Hospital Stay: Payer: Medicaid Other

## 2021-10-25 ENCOUNTER — Inpatient Hospital Stay: Payer: Medicaid Other | Attending: Oncology

## 2021-10-25 ENCOUNTER — Other Ambulatory Visit: Payer: Self-pay

## 2021-10-25 VITALS — BP 148/99 | HR 100 | Temp 98.2°F | Resp 20 | Ht 70.0 in | Wt 226.2 lb

## 2021-10-25 DIAGNOSIS — Z5112 Encounter for antineoplastic immunotherapy: Secondary | ICD-10-CM | POA: Insufficient documentation

## 2021-10-25 DIAGNOSIS — C187 Malignant neoplasm of sigmoid colon: Secondary | ICD-10-CM | POA: Insufficient documentation

## 2021-10-25 DIAGNOSIS — C787 Secondary malignant neoplasm of liver and intrahepatic bile duct: Secondary | ICD-10-CM | POA: Diagnosis present

## 2021-10-25 DIAGNOSIS — C189 Malignant neoplasm of colon, unspecified: Secondary | ICD-10-CM

## 2021-10-25 LAB — CBC WITH DIFFERENTIAL (CANCER CENTER ONLY)
Abs Immature Granulocytes: 0.01 10*3/uL (ref 0.00–0.07)
Basophils Absolute: 0 10*3/uL (ref 0.0–0.1)
Basophils Relative: 0 %
Eosinophils Absolute: 0.1 10*3/uL (ref 0.0–0.5)
Eosinophils Relative: 1 %
HCT: 39.9 % (ref 39.0–52.0)
Hemoglobin: 12.7 g/dL — ABNORMAL LOW (ref 13.0–17.0)
Immature Granulocytes: 0 %
Lymphocytes Relative: 37 %
Lymphs Abs: 2.5 10*3/uL (ref 0.7–4.0)
MCH: 27.5 pg (ref 26.0–34.0)
MCHC: 31.8 g/dL (ref 30.0–36.0)
MCV: 86.4 fL (ref 80.0–100.0)
Monocytes Absolute: 0.3 10*3/uL (ref 0.1–1.0)
Monocytes Relative: 5 %
Neutro Abs: 4 10*3/uL (ref 1.7–7.7)
Neutrophils Relative %: 57 %
Platelet Count: 129 10*3/uL — ABNORMAL LOW (ref 150–400)
RBC: 4.62 MIL/uL (ref 4.22–5.81)
RDW: 17.5 % — ABNORMAL HIGH (ref 11.5–15.5)
WBC Count: 6.9 10*3/uL (ref 4.0–10.5)
nRBC: 0 % (ref 0.0–0.2)

## 2021-10-25 LAB — TOTAL PROTEIN, URINE DIPSTICK

## 2021-10-25 LAB — CMP (CANCER CENTER ONLY)
ALT: 40 U/L (ref 0–44)
AST: 34 U/L (ref 15–41)
Albumin: 4.2 g/dL (ref 3.5–5.0)
Alkaline Phosphatase: 91 U/L (ref 38–126)
Anion gap: 13 (ref 5–15)
BUN: 6 mg/dL (ref 6–20)
CO2: 24 mmol/L (ref 22–32)
Calcium: 9.2 mg/dL (ref 8.9–10.3)
Chloride: 98 mmol/L (ref 98–111)
Creatinine: 0.87 mg/dL (ref 0.61–1.24)
GFR, Estimated: >60 mL/min (ref >60)
Glucose, Bld: 416 mg/dL — ABNORMAL HIGH (ref 70–99)
Potassium: 3.5 mmol/L (ref 3.5–5.1)
Sodium: 135 mmol/L (ref 135–145)
Total Bilirubin: 0.8 mg/dL (ref 0.3–1.2)
Total Protein: 7.5 g/dL (ref 6.5–8.1)

## 2021-10-25 LAB — CEA (ACCESS): CEA (CHCC): 61 ng/mL — ABNORMAL HIGH (ref 0.00–5.00)

## 2021-10-25 MED ORDER — SODIUM CHLORIDE 0.9 % IV SOLN
5.0000 mg/kg | Freq: Once | INTRAVENOUS | Status: AC
  Administered 2021-10-25: 500 mg via INTRAVENOUS
  Filled 2021-10-25: qty 16

## 2021-10-25 MED ORDER — HEPARIN SOD (PORK) LOCK FLUSH 100 UNIT/ML IV SOLN
500.0000 [IU] | Freq: Once | INTRAVENOUS | Status: AC | PRN
  Administered 2021-10-25: 500 [IU]

## 2021-10-25 MED ORDER — SODIUM CHLORIDE 0.9% FLUSH
10.0000 mL | INTRAVENOUS | Status: DC | PRN
  Administered 2021-10-25: 10 mL

## 2021-10-25 MED ORDER — SODIUM CHLORIDE 0.9 % IV SOLN: Freq: Once | INTRAVENOUS | Status: AC

## 2021-10-25 NOTE — Patient Instructions (Signed)
[facility]   Discharge Instructions: Thank you for choosing [facility] to provide your oncology and hematology care.   If you have a lab appointment with the [facility], please go directly to the [facility] and check in at the registration area.   Wear comfortable clothing and clothing appropriate for easy access to any Portacath or PICC line.   We strive to give you quality time with your provider. You may need to reschedule your appointment if you arrive late (15 or more minutes).  Arriving late affects you and other patients whose appointments are after yours.  Also, if you miss three or more appointments without notifying the office, you may be dismissed from the clinic at the provider's discretion.      For prescription refill requests, have your pharmacy contact our office and allow 72 hours for refills to be completed.    Today you received the following chemotherapy and/or immunotherapy agents Bevacizumab_awwb (MVASI).      To help prevent nausea and vomiting after your treatment, we encourage you to take your nausea medication as directed.  BELOW ARE SYMPTOMS THAT SHOULD BE REPORTED IMMEDIATELY: *FEVER GREATER THAN 100.4 F (38 C) OR HIGHER *CHILLS OR SWEATING *NAUSEA AND VOMITING THAT IS NOT CONTROLLED WITH YOUR NAUSEA MEDICATION *UNUSUAL SHORTNESS OF BREATH *UNUSUAL BRUISING OR BLEEDING *URINARY PROBLEMS (pain or burning when urinating, or frequent urination) *BOWEL PROBLEMS (unusual diarrhea, constipation, pain near the anus) TENDERNESS IN MOUTH AND THROAT WITH OR WITHOUT PRESENCE OF ULCERS (sore throat, sores in mouth, or a toothache) UNUSUAL RASH, SWELLING OR PAIN  UNUSUAL VAGINAL DISCHARGE OR ITCHING   Items with * indicate a potential emergency and should be followed up as soon as possible or go to the Emergency Department if any problems should occur.  Please show the CHEMOTHERAPY ALERT CARD or IMMUNOTHERAPY ALERT CARD  at check-in to the Emergency Department and triage nurse.  Should you have questions after your visit or need to cancel or reschedule your appointment, please contact [facility]  Dept: [phone]  and follow the prompts.  Office hours are 8:00 a.m. to 4:30 p.m. Monday - Friday. Please note that voicemails left after 4:00 p.m. may not be returned until the following business day.  We are closed weekends and major holidays. You have access to a nurse at all times for urgent questions. Please call the main number to the clinic Dept: [phone] and follow the prompts.   For any non-urgent questions, you may also contact your provider using MyChart. We now offer e-Visits for anyone 5 and older to request care online for non-urgent symptoms. For details visit [email or web address].   Also download the MyChart app! Go to the app store, search "MyChart", open the app, select [facility], and log in with your MyChart username and password.  Due to Covid, a mask is required upon entering the hospital/clinic. If you do not have a mask, one will be given to you upon arrival. For doctor visits, patients may have 1 support person aged 48 or older with them. For treatment visits, patients cannot have anyone with them due to current Covid guidelines and our immunocompromised population.   Bevacizumab injection What is this medication? BEVACIZUMAB (be va SIZ yoo mab) is a monoclonal antibody. It is used to treat many types of cancer. This medicine may be used for other purposes; ask your health care provider or pharmacist if you have questions. COMMON BRAND NAME(S):  Alymsys, Avastin, MVASI, [name] What should I tell my care team before I take this medication? They need to know if you have any of these conditions: diabetes heart disease high blood pressure history of coughing up blood prior anthracycline chemotherapy (e.g., doxorubicin, daunorubicin, epirubicin) recent or  ongoing radiation therapy recent or planning to have surgery stroke an unusual or allergic reaction to bevacizumab, hamster proteins, mouse proteins, other medicines, foods, dyes, or preservatives pregnant or trying to get pregnant breast-feeding How should I use this medication? This medicine is for infusion into a vein. It is given by a health care professional in a hospital or clinic setting. Talk to your pediatrician regarding the use of this medicine in children. Special care may be needed. Overdosage: If you think you have taken too much of this medicine contact a poison control center or emergency room at once. NOTE: This medicine is only for you. Do not share this medicine with others. What if I miss a dose? It is important not to miss your dose. Call your doctor or health care professional if you are unable to keep an appointment. What may interact with this medication? Interactions are not expected. This list may not describe all possible interactions. Give your health care provider a list of all the medicines, herbs, non-prescription drugs, or dietary supplements you use. Also tell them if you smoke, drink alcohol, or use illegal drugs. Some items may interact with your medicine. What should I watch for while using this medication? Your condition will be monitored carefully while you are receiving this medicine. You will need important blood work and urine testing done while you are taking this medicine. This medicine may increase your risk to bruise or bleed. Call your doctor or health care professional if you notice any unusual bleeding. Before having surgery, talk to your health care provider to make sure it is ok. This drug can increase the risk of poor healing of your surgical site or wound. You will need to stop this drug for 28 days before surgery. After surgery, wait at least 28 days before restarting this drug. Make sure the surgical site or wound is healed enough before  restarting this drug. Talk to your health care provider if questions. Do not become pregnant while taking this medicine or for 6 months after stopping it. Women should inform their doctor if they wish to become pregnant or think they might be pregnant. There is a potential for serious side effects to an unborn child. Talk to your health care professional or pharmacist for more information. Do not breast-feed an infant while taking this medicine and for 6 months after the last dose. This medicine has caused ovarian failure in some women. This medicine may interfere with the ability to have a child. You should talk to your doctor or health care professional if you are concerned about your fertility. What side effects may I notice from receiving this medication? Side effects that you should report to your doctor or health care professional as soon as possible: allergic reactions like skin rash, itching or hives, swelling of the face, lips, or tongue chest pain or chest tightness chills coughing up blood high fever seizures severe constipation signs and symptoms of bleeding such as bloody or black, tarry stools; red or dark-brown urine; spitting up blood or brown material that looks like coffee grounds; red spots on the skin; unusual bruising or bleeding from the eye, gums, or nose signs and symptoms of a blood clot such as  breathing problems; chest pain; severe, sudden headache; pain, swelling, warmth in the leg signs and symptoms of a stroke like changes in vision; confusion; trouble speaking or understanding; severe headaches; sudden numbness or weakness of the face, arm or leg; trouble walking; dizziness; loss of balance or coordination stomach pain sweating swelling of legs or ankles vomiting weight gain Side effects that usually do not require medical attention (report to your doctor or health care professional if they continue or are bothersome): back pain changes in taste decreased  appetite dry skin nausea tiredness This list may not describe all possible side effects. Call your doctor for medical advice about side effects. You may report side effects to FDA at 1-800-FDA-1088. Where should I keep my medication? This drug is given in a hospital or clinic and will not be stored at home. NOTE: This sheet is a summary. It may not cover all possible information. If you have questions about this medicine, talk to your doctor, pharmacist, or health care provider.  2023 Elsevier/Gold Standard (2021-04-02 00:00:00)

## 2021-10-27 ENCOUNTER — Other Ambulatory Visit (HOSPITAL_COMMUNITY): Payer: Self-pay

## 2021-11-02 ENCOUNTER — Other Ambulatory Visit (HOSPITAL_COMMUNITY): Payer: Self-pay

## 2021-11-04 ENCOUNTER — Other Ambulatory Visit: Payer: Self-pay | Admitting: Oncology

## 2021-11-04 ENCOUNTER — Other Ambulatory Visit (HOSPITAL_COMMUNITY): Payer: Self-pay

## 2021-11-04 DIAGNOSIS — C787 Secondary malignant neoplasm of liver and intrahepatic bile duct: Secondary | ICD-10-CM

## 2021-11-04 MED ORDER — LONSURF 20-8.19 MG PO TABS
60.0000 mg | ORAL_TABLET | Freq: Two times a day (BID) | ORAL | 0 refills | Status: DC
  Filled 2021-11-04: qty 60, 10d supply, fill #0

## 2021-11-07 ENCOUNTER — Other Ambulatory Visit: Payer: Self-pay | Admitting: Oncology

## 2021-11-08 ENCOUNTER — Other Ambulatory Visit (HOSPITAL_COMMUNITY): Payer: Self-pay

## 2021-11-08 ENCOUNTER — Inpatient Hospital Stay: Payer: Medicaid Other | Admitting: Nurse Practitioner

## 2021-11-08 ENCOUNTER — Encounter: Payer: Self-pay | Admitting: Nurse Practitioner

## 2021-11-08 ENCOUNTER — Inpatient Hospital Stay: Payer: Medicaid Other

## 2021-11-08 ENCOUNTER — Encounter: Payer: Self-pay | Admitting: *Deleted

## 2021-11-08 VITALS — BP 145/80 | HR 98 | Temp 98.1°F | Resp 18 | Ht 70.0 in | Wt 226.4 lb

## 2021-11-08 VITALS — BP 155/99 | HR 94

## 2021-11-08 DIAGNOSIS — C787 Secondary malignant neoplasm of liver and intrahepatic bile duct: Secondary | ICD-10-CM

## 2021-11-08 DIAGNOSIS — C187 Malignant neoplasm of sigmoid colon: Secondary | ICD-10-CM

## 2021-11-08 DIAGNOSIS — Z5112 Encounter for antineoplastic immunotherapy: Secondary | ICD-10-CM | POA: Diagnosis not present

## 2021-11-08 DIAGNOSIS — C189 Malignant neoplasm of colon, unspecified: Secondary | ICD-10-CM | POA: Diagnosis not present

## 2021-11-08 DIAGNOSIS — Z95828 Presence of other vascular implants and grafts: Secondary | ICD-10-CM

## 2021-11-08 LAB — CMP (CANCER CENTER ONLY)
ALT: 52 U/L — ABNORMAL HIGH (ref 0–44)
AST: 46 U/L — ABNORMAL HIGH (ref 15–41)
Albumin: 4.3 g/dL (ref 3.5–5.0)
Alkaline Phosphatase: 82 U/L (ref 38–126)
Anion gap: 14 (ref 5–15)
BUN: 6 mg/dL (ref 6–20)
CO2: 24 mmol/L (ref 22–32)
Calcium: 9.8 mg/dL (ref 8.9–10.3)
Chloride: 102 mmol/L (ref 98–111)
Creatinine: 1.11 mg/dL (ref 0.61–1.24)
GFR, Estimated: >60 mL/min (ref >60)
Glucose, Bld: 220 mg/dL — ABNORMAL HIGH (ref 70–99)
Potassium: 3.5 mmol/L (ref 3.5–5.1)
Sodium: 140 mmol/L (ref 135–145)
Total Bilirubin: 0.6 mg/dL (ref 0.3–1.2)
Total Protein: 7.6 g/dL (ref 6.5–8.1)

## 2021-11-08 LAB — CBC WITH DIFFERENTIAL (CANCER CENTER ONLY)
Abs Immature Granulocytes: 0.01 10*3/uL (ref 0.00–0.07)
Basophils Absolute: 0 10*3/uL (ref 0.0–0.1)
Basophils Relative: 0 %
Eosinophils Absolute: 0.2 10*3/uL (ref 0.0–0.5)
Eosinophils Relative: 3 %
HCT: 43 % (ref 39.0–52.0)
Hemoglobin: 13.4 g/dL (ref 13.0–17.0)
Immature Granulocytes: 0 %
Lymphocytes Relative: 44 %
Lymphs Abs: 2.4 10*3/uL (ref 0.7–4.0)
MCH: 27.6 pg (ref 26.0–34.0)
MCHC: 31.2 g/dL (ref 30.0–36.0)
MCV: 88.7 fL (ref 80.0–100.0)
Monocytes Absolute: 0.5 10*3/uL (ref 0.1–1.0)
Monocytes Relative: 9 %
Neutro Abs: 2.5 10*3/uL (ref 1.7–7.7)
Neutrophils Relative %: 44 %
Platelet Count: 183 10*3/uL (ref 150–400)
RBC: 4.85 MIL/uL (ref 4.22–5.81)
RDW: 18.1 % — ABNORMAL HIGH (ref 11.5–15.5)
WBC Count: 5.6 10*3/uL (ref 4.0–10.5)
nRBC: 0 % (ref 0.0–0.2)

## 2021-11-08 LAB — TOTAL PROTEIN, URINE DIPSTICK: Protein, ur: NEGATIVE mg/dL

## 2021-11-08 LAB — CEA (ACCESS): CEA (CHCC): 59.23 ng/mL — ABNORMAL HIGH (ref 0.00–5.00)

## 2021-11-08 MED ORDER — HEPARIN SOD (PORK) LOCK FLUSH 100 UNIT/ML IV SOLN
500.0000 [IU] | Freq: Once | INTRAVENOUS | Status: AC | PRN
  Administered 2021-11-08: 500 [IU]

## 2021-11-08 MED ORDER — LIDOCAINE-PRILOCAINE 2.5-2.5 % EX CREA
1.0000 | TOPICAL_CREAM | CUTANEOUS | 3 refills | Status: DC | PRN
  Filled 2021-11-08: qty 30, 1d supply, fill #0

## 2021-11-08 MED ORDER — SODIUM CHLORIDE 0.9 % IV SOLN: Freq: Once | INTRAVENOUS | Status: AC

## 2021-11-08 MED ORDER — ALTEPLASE 2 MG IJ SOLR
2.0000 mg | Freq: Once | INTRAMUSCULAR | Status: AC | PRN
  Administered 2021-11-08: 2 mg
  Filled 2021-11-08: qty 2

## 2021-11-08 MED ORDER — SODIUM CHLORIDE 0.9 % IV SOLN
5.0000 mg/kg | Freq: Once | INTRAVENOUS | Status: AC
  Administered 2021-11-08: 500 mg via INTRAVENOUS
  Filled 2021-11-08: qty 4

## 2021-11-08 MED ORDER — SODIUM CHLORIDE 0.9% FLUSH
10.0000 mL | INTRAVENOUS | Status: DC | PRN
  Administered 2021-11-08: 10 mL

## 2021-11-20 ENCOUNTER — Other Ambulatory Visit: Payer: Self-pay | Admitting: Oncology

## 2021-11-22 ENCOUNTER — Inpatient Hospital Stay: Payer: Medicaid Other

## 2021-11-22 ENCOUNTER — Inpatient Hospital Stay: Payer: Medicaid Other | Attending: Oncology

## 2021-11-22 VITALS — BP 168/99 | HR 98 | Temp 98.5°F | Resp 20

## 2021-11-22 DIAGNOSIS — C787 Secondary malignant neoplasm of liver and intrahepatic bile duct: Secondary | ICD-10-CM | POA: Diagnosis present

## 2021-11-22 DIAGNOSIS — C187 Malignant neoplasm of sigmoid colon: Secondary | ICD-10-CM | POA: Insufficient documentation

## 2021-11-22 DIAGNOSIS — C189 Malignant neoplasm of colon, unspecified: Secondary | ICD-10-CM

## 2021-11-22 DIAGNOSIS — Z5112 Encounter for antineoplastic immunotherapy: Secondary | ICD-10-CM | POA: Diagnosis not present

## 2021-11-22 LAB — CMP (CANCER CENTER ONLY)
ALT: 50 U/L — ABNORMAL HIGH (ref 0–44)
AST: 41 U/L (ref 15–41)
Albumin: 4.1 g/dL (ref 3.5–5.0)
Alkaline Phosphatase: 99 U/L (ref 38–126)
Anion gap: 14 (ref 5–15)
BUN: 7 mg/dL (ref 6–20)
CO2: 25 mmol/L (ref 22–32)
Calcium: 9.3 mg/dL (ref 8.9–10.3)
Chloride: 99 mmol/L (ref 98–111)
Creatinine: 0.85 mg/dL (ref 0.61–1.24)
GFR, Estimated: >60 mL/min (ref >60)
Glucose, Bld: 393 mg/dL — ABNORMAL HIGH (ref 70–99)
Potassium: 3.8 mmol/L (ref 3.5–5.1)
Sodium: 138 mmol/L (ref 135–145)
Total Bilirubin: 0.7 mg/dL (ref 0.3–1.2)
Total Protein: 7.2 g/dL (ref 6.5–8.1)

## 2021-11-22 LAB — CBC WITH DIFFERENTIAL (CANCER CENTER ONLY)
Abs Immature Granulocytes: 0.01 10*3/uL (ref 0.00–0.07)
Basophils Absolute: 0 10*3/uL (ref 0.0–0.1)
Basophils Relative: 0 %
Eosinophils Absolute: 0.1 10*3/uL (ref 0.0–0.5)
Eosinophils Relative: 1 %
HCT: 37 % — ABNORMAL LOW (ref 39.0–52.0)
Hemoglobin: 12.2 g/dL — ABNORMAL LOW (ref 13.0–17.0)
Immature Granulocytes: 0 %
Lymphocytes Relative: 45 %
Lymphs Abs: 2.5 10*3/uL (ref 0.7–4.0)
MCH: 29 pg (ref 26.0–34.0)
MCHC: 33 g/dL (ref 30.0–36.0)
MCV: 87.9 fL (ref 80.0–100.0)
Monocytes Absolute: 0.3 10*3/uL (ref 0.1–1.0)
Monocytes Relative: 5 %
Neutro Abs: 2.7 10*3/uL (ref 1.7–7.7)
Neutrophils Relative %: 49 %
Platelet Count: 103 10*3/uL — ABNORMAL LOW (ref 150–400)
RBC: 4.21 MIL/uL — ABNORMAL LOW (ref 4.22–5.81)
RDW: 16.7 % — ABNORMAL HIGH (ref 11.5–15.5)
WBC Count: 5.6 10*3/uL (ref 4.0–10.5)
nRBC: 0 % (ref 0.0–0.2)

## 2021-11-22 MED ORDER — SODIUM CHLORIDE 0.9 % IV SOLN
5.0000 mg/kg | Freq: Once | INTRAVENOUS | Status: AC
  Administered 2021-11-22: 500 mg via INTRAVENOUS
  Filled 2021-11-22: qty 4

## 2021-11-22 MED ORDER — SODIUM CHLORIDE 0.9 % IV SOLN: Freq: Once | INTRAVENOUS | Status: AC

## 2021-11-22 MED ORDER — SODIUM CHLORIDE 0.9% FLUSH
10.0000 mL | INTRAVENOUS | Status: DC | PRN
  Administered 2021-11-22: 10 mL

## 2021-11-22 MED ORDER — HEPARIN SOD (PORK) LOCK FLUSH 100 UNIT/ML IV SOLN
500.0000 [IU] | Freq: Once | INTRAVENOUS | Status: AC | PRN
  Administered 2021-11-22: 500 [IU]

## 2021-11-22 NOTE — Patient Instructions (Signed)
[facility] [place]   Discharge Instructions: Thank you for choosing [facility] to provide your oncology and hematology care.   If you have a lab appointment with the [facility], please go directly to the [facility] and check in at the registration area.   Wear comfortable clothing and clothing appropriate for easy access to any Portacath or PICC line.   We strive to give you quality time with your provider. You may need to reschedule your appointment if you arrive late (15 or more minutes).  Arriving late affects you and other patients whose appointments are after yours.  Also, if you miss three or more appointments without notifying the office, you may be dismissed from the clinic at the provider's discretion.      For prescription refill requests, have your pharmacy contact our office and allow 72 hours for refills to be completed.    Today you received the following chemotherapy and/or immunotherapy agents Bevacizumab-awwb (MVASI).      To help prevent nausea and vomiting after your treatment, we encourage you to take your nausea medication as directed.  BELOW ARE SYMPTOMS THAT SHOULD BE REPORTED IMMEDIATELY: *FEVER GREATER THAN 100.4 F (38 C) OR HIGHER *CHILLS OR SWEATING *NAUSEA AND VOMITING THAT IS NOT CONTROLLED WITH YOUR NAUSEA MEDICATION *UNUSUAL SHORTNESS OF BREATH *UNUSUAL BRUISING OR BLEEDING *URINARY PROBLEMS (pain or burning when urinating, or frequent urination) *BOWEL PROBLEMS (unusual diarrhea, constipation, pain near the anus) TENDERNESS IN MOUTH AND THROAT WITH OR WITHOUT PRESENCE OF ULCERS (sore throat, sores in mouth, or a toothache) UNUSUAL RASH, SWELLING OR PAIN  UNUSUAL VAGINAL DISCHARGE OR ITCHING   Items with * indicate a potential emergency and should be followed up as soon as possible or go to the Emergency Department if any problems should occur.  Please show the CHEMOTHERAPY ALERT CARD or IMMUNOTHERAPY ALERT CARD  at check-in to the Emergency Department and triage nurse.  Should you have questions after your visit or need to cancel or reschedule your appointment, please contact [facility]  Dept: [phone]  and follow the prompts.  Office hours are 8:00 a.m. to 4:30 p.m. Monday - Friday. Please note that voicemails left after 4:00 p.m. may not be returned until the following business day.  We are closed weekends and major holidays. You have access to a nurse at all times for urgent questions. Please call the main number to the clinic Dept: [phone] and follow the prompts.   For any non-urgent questions, you may also contact your provider using MyChart. We now offer e-Visits for anyone 31 and older to request care online for non-urgent symptoms. For details visit [email or web address].   Also download the MyChart app! Go to the app store, search "MyChart", open the app, select [facility], and log in with your MyChart username and password.  Masks are optional in the cancer centers. If you would like for your care team to wear a mask while they are taking care of you, please let them know. For doctor visits, patients may have with them one support person who is at least 55 years old. At this time, visitors are not allowed in the infusion area.  Bevacizumab injection What is this medication? BEVACIZUMAB (be va SIZ yoo mab) is a monoclonal antibody. It is used to treat many types of cancer. This medicine may be used for other purposes; ask your health care provider or pharmacist if you have questions. COMMON BRAND NAME(S): Alymsys,  Avastin, MVASI, [name] What should I tell my care team before I take this medication? They need to know if you have any of these conditions: diabetes heart disease high blood pressure history of coughing up blood prior anthracycline chemotherapy (e.g., doxorubicin, daunorubicin, epirubicin) recent or ongoing radiation therapy recent or  planning to have surgery stroke an unusual or allergic reaction to bevacizumab, hamster proteins, mouse proteins, other medicines, foods, dyes, or preservatives pregnant or trying to get pregnant breast-feeding How should I use this medication? This medicine is for infusion into a vein. It is given by a health care professional in a hospital or clinic setting. Talk to your pediatrician regarding the use of this medicine in children. Special care may be needed. Overdosage: If you think you have taken too much of this medicine contact a poison control center or emergency room at once. NOTE: This medicine is only for you. Do not share this medicine with others. What if I miss a dose? It is important not to miss your dose. Call your doctor or health care professional if you are unable to keep an appointment. What may interact with this medication? Interactions are not expected. This list may not describe all possible interactions. Give your health care provider a list of all the medicines, herbs, non-prescription drugs, or dietary supplements you use. Also tell them if you smoke, drink alcohol, or use illegal drugs. Some items may interact with your medicine. What should I watch for while using this medication? Your condition will be monitored carefully while you are receiving this medicine. You will need important blood work and urine testing done while you are taking this medicine. This medicine may increase your risk to bruise or bleed. Call your doctor or health care professional if you notice any unusual bleeding. Before having surgery, talk to your health care provider to make sure it is ok. This drug can increase the risk of poor healing of your surgical site or wound. You will need to stop this drug for 28 days before surgery. After surgery, wait at least 28 days before restarting this drug. Make sure the surgical site or wound is healed enough before restarting this drug. Talk to your health  care provider if questions. Do not become pregnant while taking this medicine or for 6 months after stopping it. Women should inform their doctor if they wish to become pregnant or think they might be pregnant. There is a potential for serious side effects to an unborn child. Talk to your health care professional or pharmacist for more information. Do not breast-feed an infant while taking this medicine and for 6 months after the last dose. This medicine has caused ovarian failure in some women. This medicine may interfere with the ability to have a child. You should talk to your doctor or health care professional if you are concerned about your fertility. What side effects may I notice from receiving this medication? Side effects that you should report to your doctor or health care professional as soon as possible: allergic reactions like skin rash, itching or hives, swelling of the face, lips, or tongue chest pain or chest tightness chills coughing up blood high fever seizures severe constipation signs and symptoms of bleeding such as bloody or black, tarry stools; red or dark-brown urine; spitting up blood or brown material that looks like coffee grounds; red spots on the skin; unusual bruising or bleeding from the eye, gums, or nose signs and symptoms of a blood clot such as breathing  problems; chest pain; severe, sudden headache; pain, swelling, warmth in the leg signs and symptoms of a stroke like changes in vision; confusion; trouble speaking or understanding; severe headaches; sudden numbness or weakness of the face, arm or leg; trouble walking; dizziness; loss of balance or coordination stomach pain sweating swelling of legs or ankles vomiting weight gain Side effects that usually do not require medical attention (report to your doctor or health care professional if they continue or are bothersome): back pain changes in taste decreased appetite dry skin nausea tiredness This list  may not describe all possible side effects. Call your doctor for medical advice about side effects. You may report side effects to FDA at 1-800-FDA-1088. Where should I keep my medication? This drug is given in a hospital or clinic and will not be stored at home. NOTE: This sheet is a summary. It may not cover all possible information. If you have questions about this medicine, talk to your doctor, pharmacist, or health care provider.  2023 Elsevier/Gold Standard (2021-04-02 00:00:00)

## 2021-11-29 ENCOUNTER — Other Ambulatory Visit (HOSPITAL_COMMUNITY): Payer: Self-pay

## 2021-12-01 ENCOUNTER — Other Ambulatory Visit: Payer: Self-pay | Admitting: Oncology

## 2021-12-01 ENCOUNTER — Other Ambulatory Visit (HOSPITAL_COMMUNITY): Payer: Self-pay

## 2021-12-01 DIAGNOSIS — C189 Malignant neoplasm of colon, unspecified: Secondary | ICD-10-CM

## 2021-12-01 MED ORDER — LONSURF 20-8.19 MG PO TABS
60.0000 mg | ORAL_TABLET | Freq: Two times a day (BID) | ORAL | 0 refills | Status: DC
  Filled 2021-12-01: qty 60, 28d supply, fill #0

## 2021-12-02 ENCOUNTER — Other Ambulatory Visit (HOSPITAL_COMMUNITY): Payer: Self-pay

## 2021-12-05 ENCOUNTER — Other Ambulatory Visit: Payer: Self-pay | Admitting: Oncology

## 2021-12-06 ENCOUNTER — Inpatient Hospital Stay: Payer: Medicaid Other | Admitting: Oncology

## 2021-12-06 ENCOUNTER — Inpatient Hospital Stay: Payer: Medicaid Other

## 2021-12-06 ENCOUNTER — Encounter: Payer: Self-pay | Admitting: *Deleted

## 2021-12-06 ENCOUNTER — Other Ambulatory Visit: Payer: Self-pay

## 2021-12-06 VITALS — BP 145/80 | HR 100 | Temp 98.1°F | Resp 18 | Ht 70.0 in | Wt 229.4 lb

## 2021-12-06 VITALS — BP 168/100 | HR 96 | Resp 20

## 2021-12-06 DIAGNOSIS — C187 Malignant neoplasm of sigmoid colon: Secondary | ICD-10-CM

## 2021-12-06 DIAGNOSIS — Z5112 Encounter for antineoplastic immunotherapy: Secondary | ICD-10-CM | POA: Diagnosis not present

## 2021-12-06 DIAGNOSIS — C189 Malignant neoplasm of colon, unspecified: Secondary | ICD-10-CM

## 2021-12-06 LAB — CBC WITH DIFFERENTIAL (CANCER CENTER ONLY)
Abs Immature Granulocytes: 0.01 10*3/uL (ref 0.00–0.07)
Basophils Absolute: 0 10*3/uL (ref 0.0–0.1)
Basophils Relative: 0 %
Eosinophils Absolute: 0.1 10*3/uL (ref 0.0–0.5)
Eosinophils Relative: 2 %
HCT: 40.3 % (ref 39.0–52.0)
Hemoglobin: 12.8 g/dL — ABNORMAL LOW (ref 13.0–17.0)
Immature Granulocytes: 0 %
Lymphocytes Relative: 51 %
Lymphs Abs: 2.5 10*3/uL (ref 0.7–4.0)
MCH: 28.6 pg (ref 26.0–34.0)
MCHC: 31.8 g/dL (ref 30.0–36.0)
MCV: 90.2 fL (ref 80.0–100.0)
Monocytes Absolute: 0.5 10*3/uL (ref 0.1–1.0)
Monocytes Relative: 11 %
Neutro Abs: 1.8 10*3/uL (ref 1.7–7.7)
Neutrophils Relative %: 36 %
Platelet Count: 144 10*3/uL — ABNORMAL LOW (ref 150–400)
RBC: 4.47 MIL/uL (ref 4.22–5.81)
RDW: 17.5 % — ABNORMAL HIGH (ref 11.5–15.5)
WBC Count: 4.9 10*3/uL (ref 4.0–10.5)
nRBC: 0 % (ref 0.0–0.2)

## 2021-12-06 LAB — CMP (CANCER CENTER ONLY)
ALT: 40 U/L (ref 0–44)
AST: 34 U/L (ref 15–41)
Albumin: 4.2 g/dL (ref 3.5–5.0)
Alkaline Phosphatase: 87 U/L (ref 38–126)
Anion gap: 11 (ref 5–15)
BUN: 6 mg/dL (ref 6–20)
CO2: 25 mmol/L (ref 22–32)
Calcium: 9.2 mg/dL (ref 8.9–10.3)
Chloride: 100 mmol/L (ref 98–111)
Creatinine: 0.93 mg/dL (ref 0.61–1.24)
GFR, Estimated: >60 mL/min (ref >60)
Glucose, Bld: 351 mg/dL — ABNORMAL HIGH (ref 70–99)
Potassium: 3.3 mmol/L — ABNORMAL LOW (ref 3.5–5.1)
Sodium: 136 mmol/L (ref 135–145)
Total Bilirubin: 0.6 mg/dL (ref 0.3–1.2)
Total Protein: 6.9 g/dL (ref 6.5–8.1)

## 2021-12-06 LAB — TOTAL PROTEIN, URINE DIPSTICK: Protein, ur: NEGATIVE mg/dL

## 2021-12-06 LAB — CEA (ACCESS): CEA (CHCC): 78.29 ng/mL — ABNORMAL HIGH (ref 0.00–5.00)

## 2021-12-06 MED ORDER — HEPARIN SOD (PORK) LOCK FLUSH 100 UNIT/ML IV SOLN
500.0000 [IU] | Freq: Once | INTRAVENOUS | Status: AC | PRN
  Administered 2021-12-06: 500 [IU]

## 2021-12-06 MED ORDER — SODIUM CHLORIDE 0.9 % IV SOLN
5.0000 mg/kg | Freq: Once | INTRAVENOUS | Status: AC
  Administered 2021-12-06: 500 mg via INTRAVENOUS
  Filled 2021-12-06: qty 4

## 2021-12-06 MED ORDER — SODIUM CHLORIDE 0.9% FLUSH
10.0000 mL | INTRAVENOUS | Status: DC | PRN
  Administered 2021-12-06: 10 mL

## 2021-12-06 MED ORDER — SODIUM CHLORIDE 0.9 % IV SOLN: Freq: Once | INTRAVENOUS | Status: AC

## 2021-12-06 NOTE — Patient Instructions (Addendum)
[facility]   Discharge Instructions: Thank you for choosing [facility] to provide your oncology and hematology care.   If you have a lab appointment with the [facility], please go directly to the [facility] and check in at the registration area.   Wear comfortable clothing and clothing appropriate for easy access to any Portacath or PICC line.   We strive to give you quality time with your provider. You may need to reschedule your appointment if you arrive late (15 or more minutes).  Arriving late affects you and other patients whose appointments are after yours.  Also, if you miss three or more appointments without notifying the office, you may be dismissed from the clinic at the provider's discretion.      For prescription refill requests, have your pharmacy contact our office and allow 72 hours for refills to be completed.    Today you received the following chemotherapy and/or immunotherapy agents Bevacizumab-awwb (MVASI).      To help prevent nausea and vomiting after your treatment, we encourage you to take your nausea medication as directed.  BELOW ARE SYMPTOMS THAT SHOULD BE REPORTED IMMEDIATELY: *FEVER GREATER THAN 100.4 F (38 C) OR HIGHER *CHILLS OR SWEATING *NAUSEA AND VOMITING THAT IS NOT CONTROLLED WITH YOUR NAUSEA MEDICATION *UNUSUAL SHORTNESS OF BREATH *UNUSUAL BRUISING OR BLEEDING *URINARY PROBLEMS (pain or burning when urinating, or frequent urination) *BOWEL PROBLEMS (unusual diarrhea, constipation, pain near the anus) TENDERNESS IN MOUTH AND THROAT WITH OR WITHOUT PRESENCE OF ULCERS (sore throat, sores in mouth, or a toothache) UNUSUAL RASH, SWELLING OR PAIN  UNUSUAL VAGINAL DISCHARGE OR ITCHING   Items with * indicate a potential emergency and should be followed up as soon as possible or go to the Emergency Department if any problems should occur.  Please show the CHEMOTHERAPY ALERT CARD or IMMUNOTHERAPY ALERT CARD  at check-in to the Emergency Department and triage nurse.  Should you have questions after your visit or need to cancel or reschedule your appointment, please contact [facility]  Dept: [phone]  and follow the prompts.  Office hours are 8:00 a.m. to 4:30 p.m. Monday - Friday. Please note that voicemails left after 4:00 p.m. may not be returned until the following business day.  We are closed weekends and major holidays. You have access to a nurse at all times for urgent questions. Please call the main number to the clinic Dept: [phone] and follow the prompts.   For any non-urgent questions, you may also contact your provider using MyChart. We now offer e-Visits for anyone 39 and older to request care online for non-urgent symptoms. For details visit [email or web address].   Also download the MyChart app! Go to the app store, search "MyChart", open the app, select [facility], and log in with your MyChart username and password.  Masks are optional in the cancer centers. If you would like for your care team to wear a mask while they are taking care of you, please let them know. For doctor visits, patients may have with them one support person who is at least 55 years old. At this time, visitors are not allowed in the infusion area.  Bevacizumab injection What is this medication? BEVACIZUMAB (be va SIZ yoo mab) is a monoclonal antibody. It is used to treat many types of cancer. This medicine may be used for other purposes; ask your health care provider or pharmacist if you have questions. COMMON BRAND NAME(S): Alymsys,  Avastin, MVASI, [name] What should I tell my care team before I take this medication? They need to know if you have any of these conditions: diabetes heart disease high blood pressure history of coughing up blood prior anthracycline chemotherapy (e.g., doxorubicin, daunorubicin, epirubicin) recent or ongoing radiation therapy recent or  planning to have surgery stroke an unusual or allergic reaction to bevacizumab, hamster proteins, mouse proteins, other medicines, foods, dyes, or preservatives pregnant or trying to get pregnant breast-feeding How should I use this medication? This medicine is for infusion into a vein. It is given by a health care professional in a hospital or clinic setting. Talk to your pediatrician regarding the use of this medicine in children. Special care may be needed. Overdosage: If you think you have taken too much of this medicine contact a poison control center or emergency room at once. NOTE: This medicine is only for you. Do not share this medicine with others. What if I miss a dose? It is important not to miss your dose. Call your doctor or health care professional if you are unable to keep an appointment. What may interact with this medication? Interactions are not expected. This list may not describe all possible interactions. Give your health care provider a list of all the medicines, herbs, non-prescription drugs, or dietary supplements you use. Also tell them if you smoke, drink alcohol, or use illegal drugs. Some items may interact with your medicine. What should I watch for while using this medication? Your condition will be monitored carefully while you are receiving this medicine. You will need important blood work and urine testing done while you are taking this medicine. This medicine may increase your risk to bruise or bleed. Call your doctor or health care professional if you notice any unusual bleeding. Before having surgery, talk to your health care provider to make sure it is ok. This drug can increase the risk of poor healing of your surgical site or wound. You will need to stop this drug for 28 days before surgery. After surgery, wait at least 28 days before restarting this drug. Make sure the surgical site or wound is healed enough before restarting this drug. Talk to your health  care provider if questions. Do not become pregnant while taking this medicine or for 6 months after stopping it. Women should inform their doctor if they wish to become pregnant or think they might be pregnant. There is a potential for serious side effects to an unborn child. Talk to your health care professional or pharmacist for more information. Do not breast-feed an infant while taking this medicine and for 6 months after the last dose. This medicine has caused ovarian failure in some women. This medicine may interfere with the ability to have a child. You should talk to your doctor or health care professional if you are concerned about your fertility. What side effects may I notice from receiving this medication? Side effects that you should report to your doctor or health care professional as soon as possible: allergic reactions like skin rash, itching or hives, swelling of the face, lips, or tongue chest pain or chest tightness chills coughing up blood high fever seizures severe constipation signs and symptoms of bleeding such as bloody or black, tarry stools; red or dark-brown urine; spitting up blood or brown material that looks like coffee grounds; red spots on the skin; unusual bruising or bleeding from the eye, gums, or nose signs and symptoms of a blood clot such as breathing  problems; chest pain; severe, sudden headache; pain, swelling, warmth in the leg signs and symptoms of a stroke like changes in vision; confusion; trouble speaking or understanding; severe headaches; sudden numbness or weakness of the face, arm or leg; trouble walking; dizziness; loss of balance or coordination stomach pain sweating swelling of legs or ankles vomiting weight gain Side effects that usually do not require medical attention (report to your doctor or health care professional if they continue or are bothersome): back pain changes in taste decreased appetite dry skin nausea tiredness This list  may not describe all possible side effects. Call your doctor for medical advice about side effects. You may report side effects to FDA at 1-800-FDA-1088. Where should I keep my medication? This drug is given in a hospital or clinic and will not be stored at home. NOTE: This sheet is a summary. It may not cover all possible information. If you have questions about this medicine, talk to your doctor, pharmacist, or health care provider.  2023 Elsevier/Gold Standard (2021-04-02 00:00:00) Hypokalemia Hypokalemia means that the amount of potassium in the blood is lower than normal. Potassium is a mineral (electrolyte) that helps regulate the amount of fluid in the body. It also stimulates muscle tightening (contraction) and helps nerves work properly. Normally, most of the body's potassium is inside cells, and only a very small amount is in the blood. Because the amount in the blood is so small, minor changes to potassium levels in the blood can be life-threatening. What are the causes? This condition may be caused by: Antibiotic medicine. Diarrhea or vomiting. Taking too much of a medicine that helps you have a bowel movement (laxative) can cause diarrhea and lead to hypokalemia. Chronic kidney disease (CKD). Medicines that help the body get rid of excess fluid (diuretics). Eating disorders, such as anorexia or bulimia. Low magnesium levels in the body. Sweating a lot. What are the signs or symptoms? Symptoms of this condition include: Weakness. Constipation. Fatigue. Muscle cramps. Mental confusion. Skipped heartbeats or irregular heartbeat (palpitations). Tingling or numbness. How is this diagnosed? This condition is diagnosed with a blood test. How is this treated? This condition may be treated by: Taking potassium supplements. Adjusting the medicines that you take. Eating more foods that contain a lot of potassium. If your potassium level is very low, you may need to get potassium  through an IV and be monitored in the hospital. Follow these instructions at home: Eating and drinking  Eat a healthy diet. A healthy diet includes fresh fruits and vegetables, whole grains, healthy fats, and lean proteins. If told, eat more foods that contain a lot of potassium. These include: Nuts, such as peanuts and pistachios. Seeds, such as sunflower seeds and pumpkin seeds. Peas, lentils, and lima beans. Whole grain and bran cereals and breads. Fresh fruits and vegetables, such as apricots, avocado, bananas, cantaloupe, kiwi, oranges, tomatoes, asparagus, and potatoes. Juices, such as orange, tomato, and prune. Lean meats, including fish. Milk and milk products, such as yogurt. General instructions Take over-the-counter and prescription medicines only as told by your health care provider. This includes vitamins, natural food products, and supplements. Keep all follow-up visits. This is important. Contact a health care provider if: You have weakness that gets worse. You feel your heart pounding or racing. You vomit. You have diarrhea. You have diabetes and you have trouble keeping your blood sugar in your target range. Get help right away if: You have chest pain. You have shortness of breath. You have  vomiting or diarrhea that lasts for more than 2 days. You faint. These symptoms may be an emergency. Get help right away. Call 911. Do not wait to see if the symptoms will go away. Do not drive yourself to the hospital. Summary Hypokalemia means that the amount of potassium in the blood is lower than normal. This condition is diagnosed with a blood test. Hypokalemia may be treated by taking potassium supplements, adjusting the medicines that you take, or eating more foods that are high in potassium. If your potassium level is very low, you may need to get potassium through an IV and be monitored in the hospital. This information is not intended to replace advice given to you by  your health care provider. Make sure you discuss any questions you have with your health care provider. Document Revised: 01/14/2021 Document Reviewed: 01/14/2021 Elsevier Patient Education  [place].

## 2021-12-06 NOTE — Progress Notes (Signed)
[place] OFFICE PROGRESS NOTE   Diagnosis: Colon cancer  INTERVAL HISTORY:   Jeff [name] returns as scheduled.  He completed another cycle of Lonsurf/bevacizumab beginning 11/08/2021.  No nausea.  He has occasional diarrhea.  Good appetite.  No bleeding or symptom of thrombosis.  Objective:  Vital signs in last 24 hours:  Blood pressure (!) 145/80, pulse 100, temperature 98.1 F (36.7 C), temperature source Oral, resp. rate 18, height _0  (1.778 m), weight 229 lb 6.4 oz (104.1 kg), SpO2 100 %.    HEENT: No thrush or ulcers Resp: Lungs clear bilaterally Cardio: Regular rate and rhythm GI: No hepatosplenomegaly Vascular: No leg edema   Portacath/PICC-without erythema  Lab Results:  Lab Results  Component Value Date   WBC 4.9 12/06/2021   HGB 12.8 (L) 12/06/2021   HCT 40.3 12/06/2021   MCV 90.2 12/06/2021   PLT 144 (L) 12/06/2021   NEUTROABS 1.8 12/06/2021    CMP  Lab Results  Component Value Date   NA 136 12/06/2021   K 3.3 (L) 12/06/2021   CL 100 12/06/2021   CO2 25 12/06/2021   GLUCOSE 351 (H) 12/06/2021   BUN 6 12/06/2021   CREATININE 0.93 12/06/2021   CALCIUM 9.2 12/06/2021   PROT 6.9 12/06/2021   ALBUMIN 4.2 12/06/2021   AST 34 12/06/2021   ALT 40 12/06/2021   ALKPHOS 87 12/06/2021   BILITOT 0.6 12/06/2021   GFRNONAA >60 12/06/2021   GFRAA >60 01/30/2020    Lab Results  Component Value Date   CEA1 3.19 09/10/2020   CEA 78.29 (H) 12/06/2021     Medications: I have reviewed the patient's current medications.   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, biopsy confirmed adenocarcinoma         CTs chest, abdomen, and pelvis on 12/11/2015-indeterminate tiny pulmonary nodules, multiple liver metastases, small nodes in the sigmoid mesocolon Laparoscopic sigmoid colectomy, liver biopsy, Port-A-Cath placement 01/14/2016 Pathology sigmoid colon resection- colonic  adenocarcinoma, 5.3 cm extending into pericolonic connective tissue, positive lymph-vascular invasion, positive perineural invasion, negative margins, metastatic carcinoma in 9 of 28 lymph nodes Pathology liver biopsy-metastatic colorectal adenocarcinoma MSI stable; mismatch repair protein normal APC alteration, K RAS wild-type, no BRAF mutation Cycle 1 FOLFOX 02/02/2016 Cycle 2 FOLFOX 02/15/2016 Cycle 3 FOLFOX 02/29/2016 Cycle 4 FOLFOX 03/14/2016 Cycle 5 FOLFOX 03/28/2016 Cycle 6 FOLFOX 04/11/2016 (oxaliplatin held secondary to thrombocytopenia) 04/21/2016 restaging CTs-difficulty evaluating liver lesions due to hepatic steatosis. Stable right adrenal nodule. No adenopathy or local recurrence near the rectosigmoid anastomotic site. Cycle 7 FOLFOX 04/25/2016 MRI liver 05/02/2016-partial improvement in hepatic metastases Cycle 8 FOLFOX 05/10/2016 Cycle 9 FOLFOX 05/23/2016 (oxaliplatin held due to thrombocytopenia) Cycle 10 FOLFOX 06/06/2016 (oxaliplatin dose reduced due to thrombocytopenia) Cycle 11 FOLFOX 06/27/2016 (oxaliplatin held due to neuropathy) Cycle 12 FOLFOX 07/11/2016 (oxaliplatin held) Initiation of maintenance Xeloda 7 days on/7 days off 07/27/2016 MRI liver 11/18/2016-decrease in hepatic metastatic disease. No new or progressive disease identified within the abdomen. Continuation of Xeloda 7 days on/7 days off MRI liver 04/27/2017-previous liver lesions not identified, no new lesions, no lymphadenopathy Xeloda continued 7 days on/7 days off MRI liver 12/04/2017 - no evidence of metastatic disease, hepatic steatosis Xeloda continued 7 days on/7 days off MRI liver 07/15/2018- no evidence of metastatic disease.  Stable severe hepatic steatosis. Xeloda continued 7 days on/7 days off MRI liver 03/16/2019-hepatic steatosis, no liver mass, focal area of intrahepatic biliary dilatation in segments 2 and 3 of  the left lobe-increased Xeloda continued 7 days on/7 days off MRI abdomen  08/19/2019-no findings to suggest liver metastases.  Bilateral lung nodules measuring up to 2.8 cm, progressive and more conspicuous than on previous exam CT chest 08/29/2019-multiple pulmonary metastases, new from 04/21/2016 Cycle 1 FOLFIRI/bevacizumab 09/09/2019 Cycle 2 FOLFIRI/bevacizumab 09/26/2019  Cycle 3 FOLFIRI/bevacizumab 10/10/2019 Cycle 4 FOLFIRI/bevacizumab 10/24/2019 Cycle 5 FOLFIRI/bevacizumab 11/07/2019 CT chest 11/14/2019-decreased size of lung nodules, no new lesions, hepatic steatosis Cycle 6 FOLFIRI/bevacizumab 11/21/2019 Cycle 7 FOLFIRI/bevacizumab 12/05/2019 Cycle 8 FOLFIRI/bevacizumab 12/19/2019 Cycle 9 FOLFIRI/bevacizumab 01/02/2020 Cycle 10 FOLFIRI/bevacizumab 01/16/2020 CT chest 01/29/2020-stable bilateral pulmonary metastases.  No new or progressive metastatic disease in the chest. Cycle 11 FOLFIRI/bevacizumab 01/30/2020 Cycle 12 FOLFIRI/bevacizumab 02/19/2020 Cycle 13 FOLFIRI/bevacizumab 03/12/2020 Cycle 14 FOLFIRI/bevacizumab 04/02/2020 Cycle 15 FOLFIRI/bevacizumab 04/23/2020 Cycle 16 FOLFIRI/bevacizumab 05/21/2020 CT chest 06/09/2020-mild progression pulmonary metastasis.  Some lesions have increased in size while others are similar. Cycle 1 irinotecan/Panitumumab 06/18/2020 Cycle 2 irinotecan/Panitumumab 07/02/2020 Cycle 3 irinotecan/panitumumab 07/16/2020 Cycle 4 irinotecan/Panitumumab 07/30/2020 Cycle 5 irinotecan/Panitumumab 08/13/2020, Emend added Cycle 6 irinotecan/Panitumumab 08/27/2020 CT chest 09/08/2020-decreased size of pulmonary nodules, no progressive disease Cycle 7 irinotecan/panitumumab 09/02/2020 Cycle 8 irinotecan/panitumumab 09/24/2020 Cycle 9 irinotecan/Panitumumab 10/08/2020 10/22/2020 treatment held due to left foot fracture, need for surgery Cycle 10 irinotecan/panitumumab 11/24/2020 Cycle 11 irinotecan/Panitumumab 12/08/2020 Cycle 12 irinotecan/panitumumab 12/22/2020 Cycle 13 irinotecan/panitumumab 01/05/2021 01/20/2021 CT chest-mixed response with minimal increase in size  of some lesions and minimal decrease in the size of other lesions.  Overall number of lesions is unchanged. Cycle 14 irinotecan/Panitumumab 02/05/2021 Cycle 15 irinotecan/Panitumumab 02/23/2021 Cycle 16 irinotecan/panitumumab 03/16/2021 Cycle 17 irinotecan/Panitumumab 04/13/2021 05/03/2021-CT chest-enlargement of pulmonary metastases, no new lesions 06/21/2021-cycle 1 Lonsurf/bevacizumab 07/19/2021-cycle 2 Lonsurf/bevacizumab 08/16/2021-cycle 3 Lonsurf/bevacizumab 09/09/2021 CT chest-bilateral lung nodules and masses with mixed response, overall stable to very minimally increased 09/13/2021 cycle 4 Lonsurf/bevacizumab 10/12/2021 cycle 5 Lonsurf/bevacizumab 10/21/2021 chest CT-bilateral pulmonary metastases without significant change.  No new or progressive disease within the chest. 11/08/2021 cycle 6 Lonsurf/bevacizumab 12/06/2021 cycle 7 Lonsurf/bevacizumab   2.   Rectal bleeding and constipation secondary to #1   3.   History of peripheral vascular disease, status post left lower extremity vascular bypass surgery in April 2017   4.   History of nephrolithiasis   5.   History of Graves' disease treated with radioactive iodine   6.   Anxiety/depression   7.   Hypertension   8.   Hospitalization 01/19/2016 with wound dehiscence status post secondary suture closure of abdominal wall   9.   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 Hyperglycemia 06/20/2016-diagnosed with diabetes, maintained on insulin   11.  Positive COVID test 12/13/2018; positive COVID test 01/25/2021   12. Hospitalized with seizure activity/DKA. Now on Keppra, insulin adjusted. 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  Left foot fracture-surgical repair  11/11/2020     Disposition: Jeff [name] appears stable.  He is tolerating the Lonsurf/bevacizumab well.  There is no clinical evidence of disease progression.  He will complete another cycle beginning today.  He will return for bevacizumab in 2 weeks and an office visit in 4 weeks.  Betsy Coder, MD  12/06/2021  10:22 AM

## 2021-12-06 NOTE — Progress Notes (Signed)
Patient seen by Dr. Sherrill today ? ?Vitals are within treatment parameters. ? ?Labs reviewed by Dr. Sherrill and are within treatment parameters. ? ?Per physician team, patient is ready for treatment and there are NO modifications to the treatment plan.  ?

## 2021-12-07 ENCOUNTER — Other Ambulatory Visit (HOSPITAL_COMMUNITY): Payer: Self-pay

## 2021-12-14 ENCOUNTER — Other Ambulatory Visit: Payer: Self-pay

## 2021-12-15 ENCOUNTER — Other Ambulatory Visit: Payer: Self-pay

## 2021-12-15 ENCOUNTER — Other Ambulatory Visit: Payer: Self-pay | Admitting: Nurse Practitioner

## 2021-12-15 DIAGNOSIS — C787 Secondary malignant neoplasm of liver and intrahepatic bile duct: Secondary | ICD-10-CM

## 2021-12-15 DIAGNOSIS — C187 Malignant neoplasm of sigmoid colon: Secondary | ICD-10-CM

## 2021-12-15 MED ORDER — LORAZEPAM 0.5 MG PO TABS: 0.5000 mg | ORAL_TABLET | Freq: Three times a day (TID) | ORAL | 0 refills | Status: DC | PRN

## 2021-12-19 ENCOUNTER — Other Ambulatory Visit: Payer: Self-pay | Admitting: Oncology

## 2021-12-20 ENCOUNTER — Inpatient Hospital Stay: Payer: Medicaid Other

## 2021-12-20 ENCOUNTER — Inpatient Hospital Stay: Payer: Medicaid Other | Attending: Oncology

## 2021-12-20 VITALS — BP 138/78 | HR 96 | Temp 98.0°F | Resp 18 | Ht 70.0 in | Wt 228.6 lb

## 2021-12-20 VITALS — BP 140/82

## 2021-12-20 DIAGNOSIS — Z452 Encounter for adjustment and management of vascular access device: Secondary | ICD-10-CM | POA: Insufficient documentation

## 2021-12-20 DIAGNOSIS — C187 Malignant neoplasm of sigmoid colon: Secondary | ICD-10-CM | POA: Insufficient documentation

## 2021-12-20 DIAGNOSIS — Z5112 Encounter for antineoplastic immunotherapy: Secondary | ICD-10-CM | POA: Diagnosis present

## 2021-12-20 DIAGNOSIS — C787 Secondary malignant neoplasm of liver and intrahepatic bile duct: Secondary | ICD-10-CM | POA: Diagnosis present

## 2021-12-20 DIAGNOSIS — Z95828 Presence of other vascular implants and grafts: Secondary | ICD-10-CM

## 2021-12-20 LAB — CBC WITH DIFFERENTIAL (CANCER CENTER ONLY)
Abs Immature Granulocytes: 0.02 10*3/uL (ref 0.00–0.07)
Basophils Absolute: 0 10*3/uL (ref 0.0–0.1)
Basophils Relative: 0 %
Eosinophils Absolute: 0.1 10*3/uL (ref 0.0–0.5)
Eosinophils Relative: 1 %
HCT: 36.6 % — ABNORMAL LOW (ref 39.0–52.0)
Hemoglobin: 11.8 g/dL — ABNORMAL LOW (ref 13.0–17.0)
Immature Granulocytes: 0 %
Lymphocytes Relative: 40 %
Lymphs Abs: 2.1 10*3/uL (ref 0.7–4.0)
MCH: 28.5 pg (ref 26.0–34.0)
MCHC: 32.2 g/dL (ref 30.0–36.0)
MCV: 88.4 fL (ref 80.0–100.0)
Monocytes Absolute: 0.2 10*3/uL (ref 0.1–1.0)
Monocytes Relative: 4 %
Neutro Abs: 2.8 10*3/uL (ref 1.7–7.7)
Neutrophils Relative %: 55 %
Platelet Count: 85 10*3/uL — ABNORMAL LOW (ref 150–400)
RBC: 4.14 MIL/uL — ABNORMAL LOW (ref 4.22–5.81)
RDW: 16.8 % — ABNORMAL HIGH (ref 11.5–15.5)
WBC Count: 5.2 10*3/uL (ref 4.0–10.5)
nRBC: 0 % (ref 0.0–0.2)

## 2021-12-20 MED ORDER — SODIUM CHLORIDE 0.9% FLUSH
10.0000 mL | INTRAVENOUS | Status: DC | PRN
  Administered 2021-12-20: 10 mL

## 2021-12-20 MED ORDER — SODIUM CHLORIDE 0.9 % IV SOLN
5.0000 mg/kg | Freq: Once | INTRAVENOUS | Status: AC
  Administered 2021-12-20: 500 mg via INTRAVENOUS
  Filled 2021-12-20: qty 16

## 2021-12-20 MED ORDER — HEPARIN SOD (PORK) LOCK FLUSH 100 UNIT/ML IV SOLN
500.0000 [IU] | Freq: Once | INTRAVENOUS | Status: AC | PRN
  Administered 2021-12-20: 500 [IU]

## 2021-12-20 MED ORDER — SODIUM CHLORIDE 0.9 % IV SOLN: Freq: Once | INTRAVENOUS | Status: AC

## 2021-12-20 MED ORDER — ALTEPLASE 2 MG IJ SOLR
2.0000 mg | Freq: Once | INTRAMUSCULAR | Status: AC | PRN
  Administered 2021-12-20: 2 mg
  Filled 2021-12-20: qty 2

## 2021-12-20 NOTE — Patient Instructions (Signed)

## 2021-12-20 NOTE — Progress Notes (Signed)
Patient stated he had a seizure over the weekend.  Stated he was taken to ED and was checked out.  Patient stated he has had seizures before and this was nothing new.  Dr. Benay Spice made aware. Per Dr. Benay Spice, ok to proceed with treatment.

## 2021-12-22 ENCOUNTER — Encounter: Payer: Self-pay | Admitting: *Deleted

## 2021-12-22 ENCOUNTER — Other Ambulatory Visit (HOSPITAL_COMMUNITY): Payer: Self-pay

## 2021-12-22 NOTE — Progress Notes (Unsigned)
Received FMLA forms from [employer] for Jeff [name]. Called and left VM requesting her to provide start date and if leave is continuous or interimttent.

## 2021-12-24 ENCOUNTER — Telehealth: Payer: Self-pay | Admitting: Neurology

## 2021-12-24 NOTE — Telephone Encounter (Signed)
Patients wife called to let Dr know Jeff [name] had a seizure Sat the 5th.  He fell in a parking lot, went to ED.  He is fine, she said he admitted he wasn't taking his medicine like hes supposed to.

## 2021-12-24 NOTE — Telephone Encounter (Signed)
Pls ask wife which ER, it is not on Epic, so we can review notes. Pls remind her that the main trigger for seizures is missing medication. Sleep deprivation and alcohol can also trigger seizures. These are triggers we can try to avoid. Pls have her keep an eye on him with taking medication regularly. No driving until 6 mos seizure-free. Thanks

## 2021-12-24 NOTE — Telephone Encounter (Signed)
Called patients wife no answer left voicemail message

## 2021-12-27 ENCOUNTER — Other Ambulatory Visit (HOSPITAL_COMMUNITY): Payer: Self-pay

## 2021-12-27 ENCOUNTER — Other Ambulatory Visit: Payer: Self-pay | Admitting: Oncology

## 2021-12-27 DIAGNOSIS — C189 Malignant neoplasm of colon, unspecified: Secondary | ICD-10-CM

## 2021-12-27 MED ORDER — LONSURF 20-8.19 MG PO TABS
60.0000 mg | ORAL_TABLET | Freq: Two times a day (BID) | ORAL | 0 refills | Status: DC
  Filled 2021-12-27: qty 60, 28d supply, fill #0

## 2021-12-27 NOTE — Telephone Encounter (Signed)
Spoke to patients wife patient went to [facility] and I will work on getting those notes

## 2021-12-29 ENCOUNTER — Other Ambulatory Visit (HOSPITAL_COMMUNITY): Payer: Self-pay

## 2022-01-02 ENCOUNTER — Other Ambulatory Visit: Payer: Self-pay | Admitting: Oncology

## 2022-01-03 ENCOUNTER — Encounter: Payer: Self-pay | Admitting: Nurse Practitioner

## 2022-01-03 ENCOUNTER — Inpatient Hospital Stay: Payer: Medicaid Other

## 2022-01-03 ENCOUNTER — Inpatient Hospital Stay (HOSPITAL_BASED_OUTPATIENT_CLINIC_OR_DEPARTMENT_OTHER): Payer: Medicaid Other | Admitting: Nurse Practitioner

## 2022-01-03 ENCOUNTER — Telehealth: Payer: Self-pay

## 2022-01-03 ENCOUNTER — Other Ambulatory Visit: Payer: Self-pay | Admitting: Nurse Practitioner

## 2022-01-03 ENCOUNTER — Encounter: Payer: Self-pay | Admitting: *Deleted

## 2022-01-03 VITALS — BP 148/99 | HR 99

## 2022-01-03 VITALS — BP 140/80 | HR 100 | Temp 98.2°F | Resp 18 | Ht 70.0 in | Wt 228.4 lb

## 2022-01-03 DIAGNOSIS — C787 Secondary malignant neoplasm of liver and intrahepatic bile duct: Secondary | ICD-10-CM | POA: Diagnosis not present

## 2022-01-03 DIAGNOSIS — C187 Malignant neoplasm of sigmoid colon: Secondary | ICD-10-CM

## 2022-01-03 DIAGNOSIS — C189 Malignant neoplasm of colon, unspecified: Secondary | ICD-10-CM | POA: Diagnosis not present

## 2022-01-03 DIAGNOSIS — Z5112 Encounter for antineoplastic immunotherapy: Secondary | ICD-10-CM | POA: Diagnosis not present

## 2022-01-03 LAB — CBC WITH DIFFERENTIAL (CANCER CENTER ONLY)
Abs Immature Granulocytes: 0.01 10*3/uL (ref 0.00–0.07)
Basophils Absolute: 0 10*3/uL (ref 0.0–0.1)
Basophils Relative: 0 %
Eosinophils Absolute: 0.1 10*3/uL (ref 0.0–0.5)
Eosinophils Relative: 2 %
HCT: 37.2 % — ABNORMAL LOW (ref 39.0–52.0)
Hemoglobin: 12.4 g/dL — ABNORMAL LOW (ref 13.0–17.0)
Immature Granulocytes: 0 %
Lymphocytes Relative: 57 %
Lymphs Abs: 2.8 10*3/uL (ref 0.7–4.0)
MCH: 29.6 pg (ref 26.0–34.0)
MCHC: 33.3 g/dL (ref 30.0–36.0)
MCV: 88.8 fL (ref 80.0–100.0)
Monocytes Absolute: 0.5 10*3/uL (ref 0.1–1.0)
Monocytes Relative: 10 %
Neutro Abs: 1.6 10*3/uL — ABNORMAL LOW (ref 1.7–7.7)
Neutrophils Relative %: 31 %
Platelet Count: 163 10*3/uL (ref 150–400)
RBC: 4.19 MIL/uL — ABNORMAL LOW (ref 4.22–5.81)
RDW: 17.3 % — ABNORMAL HIGH (ref 11.5–15.5)
WBC Count: 5 10*3/uL (ref 4.0–10.5)
nRBC: 0 % (ref 0.0–0.2)

## 2022-01-03 LAB — CMP (CANCER CENTER ONLY)
ALT: 163 U/L — ABNORMAL HIGH (ref 0–44)
AST: 92 U/L — ABNORMAL HIGH (ref 15–41)
Albumin: 4.3 g/dL (ref 3.5–5.0)
Alkaline Phosphatase: 120 U/L (ref 38–126)
Anion gap: 12 (ref 5–15)
BUN: 5 mg/dL — ABNORMAL LOW (ref 6–20)
CO2: 26 mmol/L (ref 22–32)
Calcium: 8.7 mg/dL — ABNORMAL LOW (ref 8.9–10.3)
Chloride: 100 mmol/L (ref 98–111)
Creatinine: 0.96 mg/dL (ref 0.61–1.24)
GFR, Estimated: >60 mL/min (ref >60)
Glucose, Bld: 297 mg/dL — ABNORMAL HIGH (ref 70–99)
Potassium: 3.2 mmol/L — ABNORMAL LOW (ref 3.5–5.1)
Sodium: 138 mmol/L (ref 135–145)
Total Bilirubin: 0.6 mg/dL (ref 0.3–1.2)
Total Protein: 7.6 g/dL (ref 6.5–8.1)

## 2022-01-03 LAB — CEA (ACCESS): CEA (CHCC): 106.65 ng/mL — ABNORMAL HIGH (ref 0.00–5.00)

## 2022-01-03 LAB — TOTAL PROTEIN, URINE DIPSTICK: Protein, ur: NEGATIVE mg/dL

## 2022-01-03 MED ORDER — HEPARIN SOD (PORK) LOCK FLUSH 100 UNIT/ML IV SOLN
500.0000 [IU] | Freq: Once | INTRAVENOUS | Status: AC | PRN
  Administered 2022-01-03: 500 [IU]

## 2022-01-03 MED ORDER — SODIUM CHLORIDE 0.9 % IV SOLN: Freq: Once | INTRAVENOUS | Status: AC

## 2022-01-03 MED ORDER — POTASSIUM CHLORIDE CRYS ER 20 MEQ PO TBCR: 20.0000 meq | EXTENDED_RELEASE_TABLET | Freq: Every day | ORAL | 1 refills | Status: DC

## 2022-01-03 MED ORDER — SODIUM CHLORIDE 0.9% FLUSH
10.0000 mL | INTRAVENOUS | Status: DC | PRN
  Administered 2022-01-03: 10 mL

## 2022-01-03 MED ORDER — SODIUM CHLORIDE 0.9 % IV SOLN
5.0000 mg/kg | Freq: Once | INTRAVENOUS | Status: AC
  Administered 2022-01-03: 500 mg via INTRAVENOUS
  Filled 2022-01-03: qty 16

## 2022-01-03 MED ORDER — LORAZEPAM 0.5 MG PO TABS: 0.5000 mg | ORAL_TABLET | Freq: Three times a day (TID) | ORAL | 0 refills | Status: DC | PRN

## 2022-01-03 NOTE — Progress Notes (Signed)
[place] OFFICE PROGRESS NOTE   Diagnosis: Colon cancer  INTERVAL HISTORY:   Mr. Dollins returns for follow-up.  He completed cycle 7 Lonsurf/bevacizumab beginning 12/06/2021.  He denies nausea/vomiting.  No mouth sores.  He has intermittent diarrhea.  No rash.  No bleeding.  He reports having a seizure since his last visit.  He fell.  The dentist removed several teeth.  He reports poor compliance with seizure medication.  He is now taking it consistently.  Objective:  Vital signs in last 24 hours:  Blood pressure (!) 140/80, pulse 100, temperature 98.2 F (36.8 C), temperature source Oral, resp. rate 18, height $RemoveBe'5\' 10"'zJsuBaXkp$  (1.778 m), weight 228 lb 6.4 oz (103.6 kg), SpO2 99 %.    HEENT: No thrush or ulcers.  Several missing teeth lower anterior gumline. Resp: Lungs clear bilaterally. Cardio: Regular rate and rhythm. GI: Abdomen soft and nontender.  No hepatosplenomegaly. Vascular: No leg edema. Neuro: Alert and oriented. Port-A-Cath without erythema.  Lab Results:  Lab Results  Component Value Date   WBC 5.0 01/03/2022   HGB 12.4 (L) 01/03/2022   HCT 37.2 (L) 01/03/2022   MCV 88.8 01/03/2022   PLT 163 01/03/2022   NEUTROABS 1.6 (L) 01/03/2022    Imaging:  No results found.  Medications: I have reviewed the patient's current medications.  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, biopsy confirmed adenocarcinoma         CTs chest, abdomen, and pelvis on 12/11/2015-indeterminate tiny pulmonary nodules, multiple liver metastases, small nodes in the sigmoid mesocolon Laparoscopic sigmoid colectomy, liver biopsy, Port-A-Cath placement 01/14/2016 Pathology sigmoid colon resection- colonic adenocarcinoma, 5.3 cm extending into pericolonic connective tissue, positive lymph-vascular invasion, positive perineural invasion, negative margins, metastatic carcinoma in 9 of 28 lymph nodes Pathology  liver biopsy-metastatic colorectal adenocarcinoma MSI stable; mismatch repair protein normal APC alteration, K RAS wild-type, no BRAF mutation Cycle 1 FOLFOX 02/02/2016 Cycle 2 FOLFOX 02/15/2016 Cycle 3 FOLFOX 02/29/2016 Cycle 4 FOLFOX 03/14/2016 Cycle 5 FOLFOX 03/28/2016 Cycle 6 FOLFOX 04/11/2016 (oxaliplatin held secondary to thrombocytopenia) 04/21/2016 restaging CTs-difficulty evaluating liver lesions due to hepatic steatosis. Stable right adrenal nodule. No adenopathy or local recurrence near the rectosigmoid anastomotic site. Cycle 7 FOLFOX 04/25/2016 MRI liver 05/02/2016-partial improvement in hepatic metastases Cycle 8 FOLFOX 05/10/2016 Cycle 9 FOLFOX 05/23/2016 (oxaliplatin held due to thrombocytopenia) Cycle 10 FOLFOX 06/06/2016 (oxaliplatin dose reduced due to thrombocytopenia) Cycle 11 FOLFOX 06/27/2016 (oxaliplatin held due to neuropathy) Cycle 12 FOLFOX 07/11/2016 (oxaliplatin held) Initiation of maintenance Xeloda 7 days on/7 days off 07/27/2016 MRI liver 11/18/2016-decrease in hepatic metastatic disease. No new or progressive disease identified within the abdomen. Continuation of Xeloda 7 days on/7 days off MRI liver 04/27/2017-previous liver lesions not identified, no new lesions, no lymphadenopathy Xeloda continued 7 days on/7 days off MRI liver 12/04/2017 - no evidence of metastatic disease, hepatic steatosis Xeloda continued 7 days on/7 days off MRI liver 07/15/2018- no evidence of metastatic disease.  Stable severe hepatic steatosis. Xeloda continued 7 days on/7 days off MRI liver 03/16/2019-hepatic steatosis, no liver mass, focal area of intrahepatic biliary dilatation in segments 2 and 3 of the left lobe-increased Xeloda continued 7 days on/7 days off MRI abdomen 08/19/2019-no findings to suggest liver metastases.  Bilateral lung nodules measuring up to 2.8 cm, progressive and more conspicuous than on previous exam CT chest 08/29/2019-multiple pulmonary metastases, new  from 04/21/2016 Cycle 1 FOLFIRI/bevacizumab 09/09/2019 Cycle 2 FOLFIRI/bevacizumab 09/26/2019  Cycle 3 FOLFIRI/bevacizumab 10/10/2019 Cycle 4  FOLFIRI/bevacizumab 10/24/2019 Cycle 5 FOLFIRI/bevacizumab 11/07/2019 CT chest 11/14/2019-decreased size of lung nodules, no new lesions, hepatic steatosis Cycle 6 FOLFIRI/bevacizumab 11/21/2019 Cycle 7 FOLFIRI/bevacizumab 12/05/2019 Cycle 8 FOLFIRI/bevacizumab 12/19/2019 Cycle 9 FOLFIRI/bevacizumab 01/02/2020 Cycle 10 FOLFIRI/bevacizumab 01/16/2020 CT chest 01/29/2020-stable bilateral pulmonary metastases.  No new or progressive metastatic disease in the chest. Cycle 11 FOLFIRI/bevacizumab 01/30/2020 Cycle 12 FOLFIRI/bevacizumab 02/19/2020 Cycle 13 FOLFIRI/bevacizumab 03/12/2020 Cycle 14 FOLFIRI/bevacizumab 04/02/2020 Cycle 15 FOLFIRI/bevacizumab 04/23/2020 Cycle 16 FOLFIRI/bevacizumab 05/21/2020 CT chest 06/09/2020-mild progression pulmonary metastasis.  Some lesions have increased in size while others are similar. Cycle 1 irinotecan/Panitumumab 06/18/2020 Cycle 2 irinotecan/Panitumumab 07/02/2020 Cycle 3 irinotecan/panitumumab 07/16/2020 Cycle 4 irinotecan/Panitumumab 07/30/2020 Cycle 5 irinotecan/Panitumumab 08/13/2020, Emend added Cycle 6 irinotecan/Panitumumab 08/27/2020 CT chest 09/08/2020-decreased size of pulmonary nodules, no progressive disease Cycle 7 irinotecan/panitumumab 09/02/2020 Cycle 8 irinotecan/panitumumab 09/24/2020 Cycle 9 irinotecan/Panitumumab 10/08/2020 10/22/2020 treatment held due to left foot fracture, need for surgery Cycle 10 irinotecan/panitumumab 11/24/2020 Cycle 11 irinotecan/Panitumumab 12/08/2020 Cycle 12 irinotecan/panitumumab 12/22/2020 Cycle 13 irinotecan/panitumumab 01/05/2021 01/20/2021 CT chest-mixed response with minimal increase in size of some lesions and minimal decrease in the size of other lesions.  Overall number of lesions is unchanged. Cycle 14 irinotecan/Panitumumab 02/05/2021 Cycle 15 irinotecan/Panitumumab 02/23/2021 Cycle 16  irinotecan/panitumumab 03/16/2021 Cycle 17 irinotecan/Panitumumab 04/13/2021 05/03/2021-CT chest-enlargement of pulmonary metastases, no new lesions 06/21/2021-cycle 1 Lonsurf/bevacizumab 07/19/2021-cycle 2 Lonsurf/bevacizumab 08/16/2021-cycle 3 Lonsurf/bevacizumab 09/09/2021 CT chest-bilateral lung nodules and masses with mixed response, overall stable to very minimally increased 09/13/2021 cycle 4 Lonsurf/bevacizumab 10/12/2021 cycle 5 Lonsurf/bevacizumab 10/21/2021 chest CT-bilateral pulmonary metastases without significant change.  No new or progressive disease within the chest. 11/08/2021 cycle 6 Lonsurf/bevacizumab 12/06/2021 cycle 7 Lonsurf/bevacizumab 01/03/2022 cycle 8 Lonsurf/bevacizumab   2.   Rectal bleeding and constipation secondary to #1   3.   History of peripheral vascular disease, status post left lower extremity vascular bypass surgery in April 2017   4.   History of nephrolithiasis   5.   History of Graves' disease treated with radioactive iodine   6.   Anxiety/depression   7.   Hypertension   8.   Hospitalization 01/19/2016 with wound dehiscence status post secondary suture closure of abdominal wall   9.   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 Hyperglycemia 06/20/2016-diagnosed with diabetes, maintained on insulin   11.  Positive COVID test 12/13/2018; positive COVID test 01/25/2021   12. Hospitalized with seizure activity/DKA. Now on Keppra, insulin adjusted. 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  Left foot fracture-surgical repair 11/11/2020   Disposition: Mr. Kawahara appears unchanged.  He has completed 7 cycles of Lonsurf/bevacizumab.  He is tolerating well.  Plan to proceed with cycle 8 today as  scheduled.  CBC and chemistry panel reviewed.  Labs adequate to proceed as above.  He has mild hypokalemia.  He will begin K-Dur 20 meq daily.  Transaminases elevated.  We will continue to follow.  He will return for lab and bevacizumab in 2 weeks.  We will see him in follow-up in 4 weeks.    Ned Card ANP/GNP-BC   01/03/2022  10:33 AM

## 2022-01-03 NOTE — Telephone Encounter (Signed)
   Called Jeff [name] to verify if [name] needs the Ativan refill. [name] states he needs the Ativan refill and does not take the clonazepam and ativan together. He do wants the ativan instead of clonazepam.

## 2022-01-03 NOTE — Addendum Note (Signed)
Addended by: Owens Shark on: 01/03/2022 12:45 PM   Modules accepted: Orders

## 2022-01-03 NOTE — Progress Notes (Signed)
Patient seen by Ned Card NP today  Vitals are within treatment parameters.  Labs reviewed by Ned Card NP [place] 1.6--OK , K+ 3.2 (will start oral KCL) Glucose 297--OK to tx AST 92--OK to tx MD aware of CEA-proceed  Per physician team, patient is ready for treatment and there are NO modifications to the treatment plan.

## 2022-01-03 NOTE — Patient Instructions (Signed)
[facility]  Discharge Instructions: Thank you for choosing [facility] to provide your oncology and hematology care.   If you have a lab appointment with the Cancer Center, please go directly to the Cancer Center and check in at the registration area.   Wear comfortable clothing and clothing appropriate for easy access to any Portacath or PICC line.   We strive to give you quality time with your provider. You may need to reschedule your appointment if you arrive late (15 or more minutes).  Arriving late affects you and other patients whose appointments are after yours.  Also, if you miss three or more appointments without notifying the office, you may be dismissed from the clinic at the provider's discretion.      For prescription refill requests, have your pharmacy contact our office and allow 72 hours for refills to be completed.    Today you received the following chemotherapy and/or immunotherapy agents Bevacizumab      To help prevent nausea and vomiting after your treatment, we encourage you to take your nausea medication as directed.  BELOW ARE SYMPTOMS THAT SHOULD BE REPORTED IMMEDIATELY: *FEVER GREATER THAN 100.4 F (38 C) OR HIGHER *CHILLS OR SWEATING *NAUSEA AND VOMITING THAT IS NOT CONTROLLED WITH YOUR NAUSEA MEDICATION *UNUSUAL SHORTNESS OF BREATH *UNUSUAL BRUISING OR BLEEDING *URINARY PROBLEMS (pain or burning when urinating, or frequent urination) *BOWEL PROBLEMS (unusual diarrhea, constipation, pain near the anus) TENDERNESS IN MOUTH AND THROAT WITH OR WITHOUT PRESENCE OF ULCERS (sore throat, sores in mouth, or a toothache) UNUSUAL RASH, SWELLING OR PAIN  UNUSUAL VAGINAL DISCHARGE OR ITCHING   Items with * indicate a potential emergency and should be followed up as soon as possible or go to the Emergency Department if any problems should occur.  Please show the CHEMOTHERAPY ALERT CARD or IMMUNOTHERAPY ALERT CARD at check-in to  the Emergency Department and triage nurse.  Should you have questions after your visit or need to cancel or reschedule your appointment, please contact [facility]  Dept: [phone]  and follow the prompts.  Office hours are 8:00 a.m. to 4:30 p.m. Monday - Friday. Please note that voicemails left after 4:00 p.m. may not be returned until the following business day.  We are closed weekends and major holidays. You have access to a nurse at all times for urgent questions. Please call the main number to the clinic Dept: [phone] and follow the prompts.   For any non-urgent questions, you may also contact your provider using MyChart. We now offer e-Visits for anyone 18 and older to request care online for non-urgent symptoms. For details visit [email or web address].   Also download the MyChart app! Go to the app store, search "MyChart", open the app, select [facility], and log in with your MyChart username and password.  Masks are optional in the cancer centers. If you would like for your care team to wear a mask while they are taking care of you, please let them know. You may have one support person who is at least 55 years old accompany you for your appointments.  Bevacizumab Injection What is this medication? BEVACIZUMAB (be va SIZ yoo mab) treats some types of cancer. It works by blocking a protein that causes cancer cells to grow and multiply. This helps to slow or stop the spread of cancer cells. It is a monoclonal antibody. This medicine may be used for other purposes; ask your health care provider or   pharmacist if you have questions. COMMON BRAND NAME(S): Alymsys, Avastin, MVASI, Zirabev What should I tell my care team before I take this medication? They need to know if you have any of these conditions: Blood clots Coughing up blood Having or recent surgery Heart failure High blood pressure History of a connection between 2 or more body parts that do not  usually connect (fistula) History of a tear in your stomach or intestines Protein in your urine An unusual or allergic reaction to bevacizumab, other medications, foods, dyes, or preservatives Pregnant or trying to get pregnant Breast-feeding How should I use this medication? This medication is injected into a vein. It is given by your care team in a hospital or clinic setting. Talk to your care team the use of this medication in children. Special care may be needed. Overdosage: If you think you have taken too much of this medicine contact a poison control center or emergency room at once. NOTE: This medicine is only for you. Do not share this medicine with others. What if I miss a dose? Keep appointments for follow-up doses. It is important not to miss your dose. Call your care team if you are unable to keep an appointment. What may interact with this medication? Interactions are not expected. This list may not describe all possible interactions. Give your health care provider a list of all the medicines, herbs, non-prescription drugs, or dietary supplements you use. Also tell them if you smoke, drink alcohol, or use illegal drugs. Some items may interact with your medicine. What should I watch for while using this medication? Your condition will be monitored carefully while you are receiving this medication. You may need blood work while taking this medication. This medication may make you feel generally unwell. This is not uncommon as chemotherapy can affect healthy cells as well as cancer cells. Report any side effects. Continue your course of treatment even though you feel ill unless your care team tells you to stop. This medication may increase your risk to bruise or bleed. Call your care team if you notice any unusual bleeding. Before having surgery, talk to your care team to make sure it is ok. This medication can increase the risk of poor healing of your surgical site or wound. You will  need to stop this medication for 28 days before surgery. After surgery, wait at least 28 days before restarting this medication. Make sure the surgical site or wound is healed enough before restarting this medication. Talk to your care team if questions. Talk to your care team if you may be pregnant. Serious birth defects can occur if you take this medication during pregnancy and for 6 months after the last dose. Contraception is recommended while taking this medication and for 6 months after the last dose. Your care team can help you find the option that works for you. Do not breastfeed while taking this medication and for 6 months after the last dose. This medication can cause infertility. Talk to your care team if you are concerned about your fertility. What side effects may I notice from receiving this medication? Side effects that you should report to your care team as soon as possible: Allergic reactions--skin rash, itching, hives, swelling of the face, lips, tongue, or throat Bleeding--bloody or black, tar-like stools, vomiting blood or brown material that looks like coffee grounds, red or dark brown urine, small red or purple spots on skin, unusual bruising or bleeding Blood clot--pain, swelling, or warmth in the leg,   shortness of breath, chest pain Heart attack--pain or tightness in the chest, shoulders, arms, or jaw, nausea, shortness of breath, cold or clammy skin, feeling faint or lightheaded Heart failure--shortness of breath, swelling of the ankles, feet, or hands, sudden weight gain, unusual weakness or fatigue Increase in blood pressure Infection--fever, chills, cough, sore throat, wounds that don't heal, pain or trouble when passing urine, general feeling of discomfort or being unwell Infusion reactions--chest pain, shortness of breath or trouble breathing, feeling faint or lightheaded Kidney injury--decrease in the amount of urine, swelling of the ankles, hands, or feet Stomach pain  that is severe, does not go away, or gets worse Stroke--sudden numbness or weakness of the face, arm, or leg, trouble speaking, confusion, trouble walking, loss of balance or coordination, dizziness, severe headache, change in vision Sudden and severe headache, confusion, change in vision, seizures, which may be signs of posterior reversible encephalopathy syndrome (PRES) Side effects that usually do not require medical attention (report to your care team if they continue or are bothersome): Back pain Change in taste Diarrhea Dry skin Increased tears Nosebleed This list may not describe all possible side effects. Call your doctor for medical advice about side effects. You may report side effects to FDA at 1-800-FDA-1088. Where should I keep my medication? This medication is given in a hospital or clinic. It will not be stored at home. NOTE: This sheet is a summary. It may not cover all possible information. If you have questions about this medicine, talk to your doctor, pharmacist, or health care provider.  2023 Elsevier/Gold Standard (2021-09-14 00:00:00)  

## 2022-01-04 ENCOUNTER — Telehealth: Payer: Self-pay

## 2022-01-04 ENCOUNTER — Telehealth: Payer: Self-pay | Admitting: *Deleted

## 2022-01-04 NOTE — Telephone Encounter (Signed)
Left VM for Jeff [name] that CT has been scheduled. Check MyChart message for specifics and respond to confirm receipt.

## 2022-01-04 NOTE — Telephone Encounter (Signed)
-----   Message from Owens Shark, NP sent at 01/03/2022  4:35 PM EDT ----- Please let him and his wife know the CEA was higher today.  Dr. Benay Spice would like to get CT scans prior to the next office visit.  I will enter the orders.

## 2022-01-04 NOTE — Telephone Encounter (Signed)
Patient's wife gave verbal understanding and she will call center schedule to schedule for the Ct scan.

## 2022-01-05 ENCOUNTER — Other Ambulatory Visit (HOSPITAL_COMMUNITY): Payer: Self-pay

## 2022-01-14 ENCOUNTER — Other Ambulatory Visit (HOSPITAL_BASED_OUTPATIENT_CLINIC_OR_DEPARTMENT_OTHER): Payer: Medicaid Other

## 2022-01-14 ENCOUNTER — Inpatient Hospital Stay: Payer: Medicaid Other

## 2022-01-17 ENCOUNTER — Other Ambulatory Visit: Payer: Self-pay | Admitting: Oncology

## 2022-01-17 DIAGNOSIS — C189 Malignant neoplasm of colon, unspecified: Secondary | ICD-10-CM

## 2022-01-18 ENCOUNTER — Inpatient Hospital Stay: Payer: Medicaid Other

## 2022-01-18 ENCOUNTER — Inpatient Hospital Stay: Payer: Medicaid Other | Attending: Oncology | Admitting: Oncology

## 2022-01-18 VITALS — BP 138/80 | HR 86 | Temp 97.5°F | Resp 18 | Ht 70.0 in | Wt 229.5 lb

## 2022-01-18 DIAGNOSIS — Z5112 Encounter for antineoplastic immunotherapy: Secondary | ICD-10-CM | POA: Insufficient documentation

## 2022-01-18 DIAGNOSIS — C187 Malignant neoplasm of sigmoid colon: Secondary | ICD-10-CM

## 2022-01-18 DIAGNOSIS — C787 Secondary malignant neoplasm of liver and intrahepatic bile duct: Secondary | ICD-10-CM | POA: Diagnosis present

## 2022-01-18 DIAGNOSIS — C189 Malignant neoplasm of colon, unspecified: Secondary | ICD-10-CM

## 2022-01-18 DIAGNOSIS — Z95828 Presence of other vascular implants and grafts: Secondary | ICD-10-CM

## 2022-01-18 LAB — CBC WITH DIFFERENTIAL (CANCER CENTER ONLY)
Abs Immature Granulocytes: 0.02 10*3/uL (ref 0.00–0.07)
Basophils Absolute: 0 10*3/uL (ref 0.0–0.1)
Basophils Relative: 0 %
Eosinophils Absolute: 0.1 10*3/uL (ref 0.0–0.5)
Eosinophils Relative: 2 %
HCT: 35 % — ABNORMAL LOW (ref 39.0–52.0)
Hemoglobin: 11.5 g/dL — ABNORMAL LOW (ref 13.0–17.0)
Immature Granulocytes: 0 %
Lymphocytes Relative: 38 %
Lymphs Abs: 2.1 10*3/uL (ref 0.7–4.0)
MCH: 29.1 pg (ref 26.0–34.0)
MCHC: 32.9 g/dL (ref 30.0–36.0)
MCV: 88.6 fL (ref 80.0–100.0)
Monocytes Absolute: 0.3 10*3/uL (ref 0.1–1.0)
Monocytes Relative: 5 %
Neutro Abs: 3 10*3/uL (ref 1.7–7.7)
Neutrophils Relative %: 55 %
Platelet Count: 96 10*3/uL — ABNORMAL LOW (ref 150–400)
RBC: 3.95 MIL/uL — ABNORMAL LOW (ref 4.22–5.81)
RDW: 16.6 % — ABNORMAL HIGH (ref 11.5–15.5)
WBC Count: 5.4 10*3/uL (ref 4.0–10.5)
nRBC: 0 % (ref 0.0–0.2)

## 2022-01-18 LAB — TOTAL PROTEIN, URINE DIPSTICK: Protein, ur: NEGATIVE mg/dL

## 2022-01-18 LAB — CEA (ACCESS): CEA (CHCC): 85.46 ng/mL — ABNORMAL HIGH (ref 0.00–5.00)

## 2022-01-18 MED ORDER — SODIUM CHLORIDE 0.9% FLUSH
10.0000 mL | INTRAVENOUS | Status: DC | PRN
  Administered 2022-01-18: 10 mL via INTRAVENOUS

## 2022-01-18 MED ORDER — SODIUM CHLORIDE 0.9% FLUSH
10.0000 mL | INTRAVENOUS | Status: DC | PRN
  Administered 2022-01-18: 10 mL

## 2022-01-18 MED ORDER — HEPARIN SOD (PORK) LOCK FLUSH 100 UNIT/ML IV SOLN
500.0000 [IU] | Freq: Once | INTRAVENOUS | Status: AC | PRN
  Administered 2022-01-18: 500 [IU]

## 2022-01-18 MED ORDER — SODIUM CHLORIDE 0.9 % IV SOLN
5.0000 mg/kg | Freq: Once | INTRAVENOUS | Status: AC
  Administered 2022-01-18: 500 mg via INTRAVENOUS
  Filled 2022-01-18: qty 16

## 2022-01-18 MED ORDER — SODIUM CHLORIDE 0.9 % IV SOLN: Freq: Once | INTRAVENOUS | Status: AC

## 2022-01-18 NOTE — Patient Instructions (Signed)
[facility]  Discharge Instructions: Thank you for choosing [facility] to provide your oncology and hematology care.   If you have a lab appointment with the Cancer Center, please go directly to the Cancer Center and check in at the registration area.   Wear comfortable clothing and clothing appropriate for easy access to any Portacath or PICC line.   We strive to give you quality time with your provider. You may need to reschedule your appointment if you arrive late (15 or more minutes).  Arriving late affects you and other patients whose appointments are after yours.  Also, if you miss three or more appointments without notifying the office, you may be dismissed from the clinic at the provider's discretion.      For prescription refill requests, have your pharmacy contact our office and allow 72 hours for refills to be completed.    Today you received the following chemotherapy and/or immunotherapy agents Avastin      To help prevent nausea and vomiting after your treatment, we encourage you to take your nausea medication as directed.  BELOW ARE SYMPTOMS THAT SHOULD BE REPORTED IMMEDIATELY: *FEVER GREATER THAN 100.4 F (38 C) OR HIGHER *CHILLS OR SWEATING *NAUSEA AND VOMITING THAT IS NOT CONTROLLED WITH YOUR NAUSEA MEDICATION *UNUSUAL SHORTNESS OF BREATH *UNUSUAL BRUISING OR BLEEDING *URINARY PROBLEMS (pain or burning when urinating, or frequent urination) *BOWEL PROBLEMS (unusual diarrhea, constipation, pain near the anus) TENDERNESS IN MOUTH AND THROAT WITH OR WITHOUT PRESENCE OF ULCERS (sore throat, sores in mouth, or a toothache) UNUSUAL RASH, SWELLING OR PAIN  UNUSUAL VAGINAL DISCHARGE OR ITCHING   Items with * indicate a potential emergency and should be followed up as soon as possible or go to the Emergency Department if any problems should occur.  Please show the CHEMOTHERAPY ALERT CARD or IMMUNOTHERAPY ALERT CARD at check-in to the  Emergency Department and triage nurse.  Should you have questions after your visit or need to cancel or reschedule your appointment, please contact [facility]  Dept: [phone]  and follow the prompts.  Office hours are 8:00 a.m. to 4:30 p.m. Monday - Friday. Please note that voicemails left after 4:00 p.m. may not be returned until the following business day.  We are closed weekends and major holidays. You have access to a nurse at all times for urgent questions. Please call the main number to the clinic Dept: [phone] and follow the prompts.   For any non-urgent questions, you may also contact your provider using MyChart. We now offer e-Visits for anyone 18 and older to request care online for non-urgent symptoms. For details visit [email or web address].   Also download the MyChart app! Go to the app store, search "MyChart", open the app, select [facility], and log in with your MyChart username and password.  Masks are optional in the cancer centers. If you would like for your care team to wear a mask while they are taking care of you, please let them know. You may have one support person who is at least 55 years old accompany you for your appointments.  

## 2022-01-18 NOTE — Progress Notes (Signed)
Per MD Benay Spice, ok to treat with platelets 96 today

## 2022-01-18 NOTE — Patient Instructions (Signed)

## 2022-01-20 ENCOUNTER — Other Ambulatory Visit (HOSPITAL_COMMUNITY): Payer: Self-pay

## 2022-01-25 ENCOUNTER — Other Ambulatory Visit: Payer: Self-pay | Admitting: Oncology

## 2022-01-25 ENCOUNTER — Other Ambulatory Visit (HOSPITAL_COMMUNITY): Payer: Self-pay

## 2022-01-25 ENCOUNTER — Inpatient Hospital Stay: Payer: Medicaid Other

## 2022-01-25 ENCOUNTER — Ambulatory Visit (HOSPITAL_BASED_OUTPATIENT_CLINIC_OR_DEPARTMENT_OTHER)
Admission: RE | Admit: 2022-01-25 | Discharge: 2022-01-25 | Disposition: A | Discharge status: Home / Self Care | Payer: Medicaid Other | Source: Ambulatory Visit | Attending: Nurse Practitioner | Admitting: Nurse Practitioner

## 2022-01-25 DIAGNOSIS — Z95828 Presence of other vascular implants and grafts: Secondary | ICD-10-CM

## 2022-01-25 DIAGNOSIS — C787 Secondary malignant neoplasm of liver and intrahepatic bile duct: Secondary | ICD-10-CM | POA: Diagnosis present

## 2022-01-25 DIAGNOSIS — C189 Malignant neoplasm of colon, unspecified: Secondary | ICD-10-CM | POA: Diagnosis present

## 2022-01-25 MED ORDER — SODIUM CHLORIDE 0.9% FLUSH
10.0000 mL | INTRAVENOUS | Status: DC | PRN
  Administered 2022-01-25: 10 mL via INTRAVENOUS

## 2022-01-25 MED ORDER — IOHEXOL 300 MG/ML  SOLN
100.0000 mL | Freq: Once | INTRAMUSCULAR | Status: AC | PRN
  Administered 2022-01-25: 100 mL via INTRAVENOUS

## 2022-01-25 MED ORDER — HEPARIN SOD (PORK) LOCK FLUSH 100 UNIT/ML IV SOLN
500.0000 [IU] | Freq: Once | INTRAVENOUS | Status: AC
  Administered 2022-01-25: 500 [IU] via INTRAVENOUS

## 2022-01-25 NOTE — Telephone Encounter (Signed)
Will wait for CT results of 9/12 before reorder

## 2022-01-26 ENCOUNTER — Other Ambulatory Visit: Payer: Self-pay | Admitting: Neurosurgery

## 2022-01-26 ENCOUNTER — Other Ambulatory Visit (HOSPITAL_COMMUNITY): Payer: Self-pay

## 2022-01-26 DIAGNOSIS — M5136 Other intervertebral disc degeneration, lumbar region: Secondary | ICD-10-CM

## 2022-01-27 ENCOUNTER — Encounter: Payer: Self-pay | Admitting: Oncology

## 2022-01-27 ENCOUNTER — Other Ambulatory Visit (HOSPITAL_COMMUNITY): Payer: Self-pay

## 2022-01-27 MED ORDER — LONSURF 20-8.19 MG PO TABS
60.0000 mg | ORAL_TABLET | Freq: Two times a day (BID) | ORAL | 0 refills | Status: DC
  Filled 2022-01-27: qty 60, 28d supply, fill #0

## 2022-01-28 ENCOUNTER — Other Ambulatory Visit: Payer: Self-pay | Admitting: Oncology

## 2022-02-01 ENCOUNTER — Inpatient Hospital Stay: Payer: Medicaid Other

## 2022-02-01 ENCOUNTER — Encounter: Payer: Self-pay | Admitting: Nurse Practitioner

## 2022-02-01 ENCOUNTER — Inpatient Hospital Stay: Payer: Medicaid Other | Admitting: Nurse Practitioner

## 2022-02-01 VITALS — BP 145/90 | HR 94 | Temp 98.1°F | Resp 18 | Ht 70.0 in | Wt 228.0 lb

## 2022-02-01 DIAGNOSIS — Z5112 Encounter for antineoplastic immunotherapy: Secondary | ICD-10-CM | POA: Diagnosis not present

## 2022-02-01 DIAGNOSIS — C189 Malignant neoplasm of colon, unspecified: Secondary | ICD-10-CM | POA: Diagnosis not present

## 2022-02-01 DIAGNOSIS — C187 Malignant neoplasm of sigmoid colon: Secondary | ICD-10-CM

## 2022-02-01 DIAGNOSIS — C787 Secondary malignant neoplasm of liver and intrahepatic bile duct: Secondary | ICD-10-CM | POA: Diagnosis not present

## 2022-02-01 LAB — CMP (CANCER CENTER ONLY)
ALT: 49 U/L — ABNORMAL HIGH (ref 0–44)
AST: 34 U/L (ref 15–41)
Albumin: 4.3 g/dL (ref 3.5–5.0)
Alkaline Phosphatase: 75 U/L (ref 38–126)
Anion gap: 13 (ref 5–15)
BUN: 6 mg/dL (ref 6–20)
CO2: 24 mmol/L (ref 22–32)
Calcium: 8.8 mg/dL — ABNORMAL LOW (ref 8.9–10.3)
Chloride: 100 mmol/L (ref 98–111)
Creatinine: 0.97 mg/dL (ref 0.61–1.24)
GFR, Estimated: >60 mL/min (ref >60)
Glucose, Bld: 242 mg/dL — ABNORMAL HIGH (ref 70–99)
Potassium: 3.5 mmol/L (ref 3.5–5.1)
Sodium: 137 mmol/L (ref 135–145)
Total Bilirubin: 0.5 mg/dL (ref 0.3–1.2)
Total Protein: 7.2 g/dL (ref 6.5–8.1)

## 2022-02-01 LAB — CBC WITH DIFFERENTIAL (CANCER CENTER ONLY)
Abs Immature Granulocytes: 0.01 10*3/uL (ref 0.00–0.07)
Basophils Absolute: 0 10*3/uL (ref 0.0–0.1)
Basophils Relative: 0 %
Eosinophils Absolute: 0.1 10*3/uL (ref 0.0–0.5)
Eosinophils Relative: 2 %
HCT: 37.5 % — ABNORMAL LOW (ref 39.0–52.0)
Hemoglobin: 12 g/dL — ABNORMAL LOW (ref 13.0–17.0)
Immature Granulocytes: 0 %
Lymphocytes Relative: 49 %
Lymphs Abs: 2.1 10*3/uL (ref 0.7–4.0)
MCH: 29.8 pg (ref 26.0–34.0)
MCHC: 32 g/dL (ref 30.0–36.0)
MCV: 93.1 fL (ref 80.0–100.0)
Monocytes Absolute: 0.5 10*3/uL (ref 0.1–1.0)
Monocytes Relative: 12 %
Neutro Abs: 1.6 10*3/uL — ABNORMAL LOW (ref 1.7–7.7)
Neutrophils Relative %: 37 %
Platelet Count: 119 10*3/uL — ABNORMAL LOW (ref 150–400)
RBC: 4.03 MIL/uL — ABNORMAL LOW (ref 4.22–5.81)
RDW: 18 % — ABNORMAL HIGH (ref 11.5–15.5)
WBC Count: 4.3 10*3/uL (ref 4.0–10.5)
nRBC: 0 % (ref 0.0–0.2)

## 2022-02-01 LAB — CEA (ACCESS): CEA (CHCC): 85.25 ng/mL — ABNORMAL HIGH (ref 0.00–5.00)

## 2022-02-01 LAB — TOTAL PROTEIN, URINE DIPSTICK: Protein, ur: NEGATIVE mg/dL

## 2022-02-01 MED ORDER — LORAZEPAM 0.5 MG PO TABS: 0.5000 mg | ORAL_TABLET | Freq: Three times a day (TID) | ORAL | 0 refills | Status: DC | PRN

## 2022-02-01 MED ORDER — SODIUM CHLORIDE 0.9 % IV SOLN
5.0000 mg/kg | Freq: Once | INTRAVENOUS | Status: AC
  Administered 2022-02-01: 500 mg via INTRAVENOUS
  Filled 2022-02-01: qty 4

## 2022-02-01 MED ORDER — PROMETHAZINE HCL 12.5 MG PO TABS: 12.5000 mg | ORAL_TABLET | Freq: Four times a day (QID) | ORAL | 2 refills | Status: DC | PRN

## 2022-02-01 MED ORDER — SODIUM CHLORIDE 0.9% FLUSH
10.0000 mL | INTRAVENOUS | Status: DC | PRN
  Administered 2022-02-01: 10 mL

## 2022-02-01 MED ORDER — HEPARIN SOD (PORK) LOCK FLUSH 100 UNIT/ML IV SOLN
500.0000 [IU] | Freq: Once | INTRAVENOUS | Status: AC | PRN
  Administered 2022-02-01: 500 [IU]

## 2022-02-01 MED ORDER — SODIUM CHLORIDE 0.9 % IV SOLN: Freq: Once | INTRAVENOUS | Status: AC

## 2022-02-01 NOTE — Progress Notes (Signed)
[facility] OFFICE PROGRESS NOTE   Diagnosis: Colon cancer  INTERVAL HISTORY:   Jeff [name] returns as scheduled.  He completed cycle 8 Lonsurf/bevacizumab beginning 01/03/2022.  He denies nausea/vomiting.  No mouth sores.  No diarrhea.  No rash.  No bleeding.  Objective:  Vital signs in last 24 hours:  Blood pressure (!) 145/90, pulse 94, temperature 98.1 F (36.7 C), temperature source Oral, resp. rate 18, height $RemoveBe'5\' 10"'whHKdddKJ$  (1.778 m), weight 228 lb (103.4 kg), SpO2 100 %.    HEENT: No thrush or ulcers. Resp: Lungs clear bilaterally. Cardio: Regular rate and rhythm. GI: No hepatosplenomegaly. Vascular: No leg edema. Neuro: Alert and oriented. Skin: Palms without erythema. Port-A-Cath without erythema.   Lab Results:  Lab Results  Component Value Date   WBC 4.3 02/01/2022   HGB 12.0 (L) 02/01/2022   HCT 37.5 (L) 02/01/2022   MCV 93.1 02/01/2022   PLT 119 (L) 02/01/2022   NEUTROABS 1.6 (L) 02/01/2022    Imaging:  No results found.  Medications: I have reviewed the patient's current medications.  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, biopsy confirmed adenocarcinoma         CTs chest, abdomen, and pelvis on 12/11/2015-indeterminate tiny pulmonary nodules, multiple liver metastases, small nodes in the sigmoid mesocolon Laparoscopic sigmoid colectomy, liver biopsy, Port-A-Cath placement 01/14/2016 Pathology sigmoid colon resection- colonic adenocarcinoma, 5.3 cm extending into pericolonic connective tissue, positive lymph-vascular invasion, positive perineural invasion, negative margins, metastatic carcinoma in 9 of 28 lymph nodes Pathology liver biopsy-metastatic colorectal adenocarcinoma MSI stable; mismatch repair protein normal APC alteration, K RAS wild-type, no BRAF mutation Cycle 1 FOLFOX 02/02/2016 Cycle 2 FOLFOX 02/15/2016 Cycle 3 FOLFOX 02/29/2016 Cycle 4 FOLFOX  03/14/2016 Cycle 5 FOLFOX 03/28/2016 Cycle 6 FOLFOX 04/11/2016 (oxaliplatin held secondary to thrombocytopenia) 04/21/2016 restaging CTs-difficulty evaluating liver lesions due to hepatic steatosis. Stable right adrenal nodule. No adenopathy or local recurrence near the rectosigmoid anastomotic site. Cycle 7 FOLFOX 04/25/2016 MRI liver 05/02/2016-partial improvement in hepatic metastases Cycle 8 FOLFOX 05/10/2016 Cycle 9 FOLFOX 05/23/2016 (oxaliplatin held due to thrombocytopenia) Cycle 10 FOLFOX 06/06/2016 (oxaliplatin dose reduced due to thrombocytopenia) Cycle 11 FOLFOX 06/27/2016 (oxaliplatin held due to neuropathy) Cycle 12 FOLFOX 07/11/2016 (oxaliplatin held) Initiation of maintenance Xeloda 7 days on/7 days off 07/27/2016 MRI liver 11/18/2016-decrease in hepatic metastatic disease. No new or progressive disease identified within the abdomen. Continuation of Xeloda 7 days on/7 days off MRI liver 04/27/2017-previous liver lesions not identified, no new lesions, no lymphadenopathy Xeloda continued 7 days on/7 days off MRI liver 12/04/2017 - no evidence of metastatic disease, hepatic steatosis Xeloda continued 7 days on/7 days off MRI liver 07/15/2018- no evidence of metastatic disease.  Stable severe hepatic steatosis. Xeloda continued 7 days on/7 days off MRI liver 03/16/2019-hepatic steatosis, no liver mass, focal area of intrahepatic biliary dilatation in segments 2 and 3 of the left lobe-increased Xeloda continued 7 days on/7 days off MRI abdomen 08/19/2019-no findings to suggest liver metastases.  Bilateral lung nodules measuring up to 2.8 cm, progressive and more conspicuous than on previous exam CT chest 08/29/2019-multiple pulmonary metastases, new from 04/21/2016 Cycle 1 FOLFIRI/bevacizumab 09/09/2019 Cycle 2 FOLFIRI/bevacizumab 09/26/2019  Cycle 3 FOLFIRI/bevacizumab 10/10/2019 Cycle 4 FOLFIRI/bevacizumab 10/24/2019 Cycle 5 FOLFIRI/bevacizumab 11/07/2019 CT chest 11/14/2019-decreased  size of lung nodules, no new lesions, hepatic steatosis Cycle 6 FOLFIRI/bevacizumab 11/21/2019 Cycle 7 FOLFIRI/bevacizumab 12/05/2019 Cycle 8 FOLFIRI/bevacizumab 12/19/2019 Cycle 9 FOLFIRI/bevacizumab 01/02/2020 Cycle 10 FOLFIRI/bevacizumab 01/16/2020 CT chest 01/29/2020-stable bilateral pulmonary metastases.  No new or progressive metastatic disease in the chest. Cycle 11 FOLFIRI/bevacizumab 01/30/2020 Cycle 12 FOLFIRI/bevacizumab 02/19/2020 Cycle 13 FOLFIRI/bevacizumab 03/12/2020 Cycle 14 FOLFIRI/bevacizumab 04/02/2020 Cycle 15 FOLFIRI/bevacizumab 04/23/2020 Cycle 16 FOLFIRI/bevacizumab 05/21/2020 CT chest 06/09/2020-mild progression pulmonary metastasis.  Some lesions have increased in size while others are similar. Cycle 1 irinotecan/Panitumumab 06/18/2020 Cycle 2 irinotecan/Panitumumab 07/02/2020 Cycle 3 irinotecan/panitumumab 07/16/2020 Cycle 4 irinotecan/Panitumumab 07/30/2020 Cycle 5 irinotecan/Panitumumab 08/13/2020, Emend added Cycle 6 irinotecan/Panitumumab 08/27/2020 CT chest 09/08/2020-decreased size of pulmonary nodules, no progressive disease Cycle 7 irinotecan/panitumumab 09/02/2020 Cycle 8 irinotecan/panitumumab 09/24/2020 Cycle 9 irinotecan/Panitumumab 10/08/2020 10/22/2020 treatment held due to left foot fracture, need for surgery Cycle 10 irinotecan/panitumumab 11/24/2020 Cycle 11 irinotecan/Panitumumab 12/08/2020 Cycle 12 irinotecan/panitumumab 12/22/2020 Cycle 13 irinotecan/panitumumab 01/05/2021 01/20/2021 CT chest-mixed response with minimal increase in size of some lesions and minimal decrease in the size of other lesions.  Overall number of lesions is unchanged. Cycle 14 irinotecan/Panitumumab 02/05/2021 Cycle 15 irinotecan/Panitumumab 02/23/2021 Cycle 16 irinotecan/panitumumab 03/16/2021 Cycle 17 irinotecan/Panitumumab 04/13/2021 05/03/2021-CT chest-enlargement of pulmonary metastases, no new lesions 06/21/2021-cycle 1 Lonsurf/bevacizumab 07/19/2021-cycle 2 Lonsurf/bevacizumab 08/16/2021-cycle 3  Lonsurf/bevacizumab 09/09/2021 CT chest-bilateral lung nodules and masses with mixed response, overall stable to very minimally increased 09/13/2021 cycle 4 Lonsurf/bevacizumab 10/12/2021 cycle 5 Lonsurf/bevacizumab 10/21/2021 chest CT-bilateral pulmonary metastases without significant change.  No new or progressive disease within the chest. 11/08/2021 cycle 6 Lonsurf/bevacizumab 12/06/2021 cycle 7 Lonsurf/bevacizumab 01/03/2022 cycle 8 Lonsurf/bevacizumab CTs 01/25/2022-index lung lesion stable to mildly increased in size.  Mild retroperitoneal adenopathy, mildly increased in size compared to 12/16/2020. 02/01/2022 cycle 9 Lonsurf/bevacizumab   2.   Rectal bleeding and constipation secondary to #1   3.   History of peripheral vascular disease, status post left lower extremity vascular bypass surgery in April 2017   4.   History of nephrolithiasis   5.   History of Graves' disease treated with radioactive iodine   6.   Anxiety/depression   7.   Hypertension   8.   Hospitalization 01/19/2016 with wound dehiscence status post secondary suture closure of abdominal wall   9.   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 Hyperglycemia 06/20/2016-diagnosed with diabetes, maintained on insulin   11.  Positive COVID test 12/13/2018; positive COVID test 01/25/2021   12. Hospitalized with seizure activity/DKA. Now on Keppra, insulin adjusted. 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  Left foot fracture-surgical repair 11/11/2020  Disposition: Jeff [name] appears unchanged.  He has completed 8 cycles of Lonsurf/bevacizumab.  Recent restaging CTs with minimal change.  Plan to continue Lonsurf/bevacizumab.  He will begin cycle 9 today.  CBC and  chemistry panel reviewed.  Labs adequate to proceed as above.  He will return for Avastin in 2 weeks.  We will see him in follow-up in 4 weeks.  Patient seen with Dr. Benay Spice.    Ned Card ANP/GNP-BC   02/01/2022  9:46 AM This was a shared visit with Ned Card.  We reviewed the restaging CT findings with Jeff [name] and his wife.  I reviewed the CT images.  The overall pattern is consistent with stable disease.  The CEA is stable.  He is tolerating the Lonsurf/bevacizumab well.  The plan is to continue the current treatment.  I was present for greater than 50% today's visit.  I performed medical decision making.  Julieanne Manson, MD

## 2022-02-01 NOTE — Patient Instructions (Signed)
[facility]  Discharge Instructions: Thank you for choosing [facility] to provide your oncology and hematology care.   If you have a lab appointment with the Cancer Center, please go directly to the Cancer Center and check in at the registration area.   Wear comfortable clothing and clothing appropriate for easy access to any Portacath or PICC line.   We strive to give you quality time with your provider. You may need to reschedule your appointment if you arrive late (15 or more minutes).  Arriving late affects you and other patients whose appointments are after yours.  Also, if you miss three or more appointments without notifying the office, you may be dismissed from the clinic at the provider's discretion.      For prescription refill requests, have your pharmacy contact our office and allow 72 hours for refills to be completed.    Today you received the following chemotherapy and/or immunotherapy agents Avastin      To help prevent nausea and vomiting after your treatment, we encourage you to take your nausea medication as directed.  BELOW ARE SYMPTOMS THAT SHOULD BE REPORTED IMMEDIATELY: *FEVER GREATER THAN 100.4 F (38 C) OR HIGHER *CHILLS OR SWEATING *NAUSEA AND VOMITING THAT IS NOT CONTROLLED WITH YOUR NAUSEA MEDICATION *UNUSUAL SHORTNESS OF BREATH *UNUSUAL BRUISING OR BLEEDING *URINARY PROBLEMS (pain or burning when urinating, or frequent urination) *BOWEL PROBLEMS (unusual diarrhea, constipation, pain near the anus) TENDERNESS IN MOUTH AND THROAT WITH OR WITHOUT PRESENCE OF ULCERS (sore throat, sores in mouth, or a toothache) UNUSUAL RASH, SWELLING OR PAIN  UNUSUAL VAGINAL DISCHARGE OR ITCHING   Items with * indicate a potential emergency and should be followed up as soon as possible or go to the Emergency Department if any problems should occur.  Please show the CHEMOTHERAPY ALERT CARD or IMMUNOTHERAPY ALERT CARD at check-in to the  Emergency Department and triage nurse.  Should you have questions after your visit or need to cancel or reschedule your appointment, please contact [facility]  Dept: [phone]  and follow the prompts.  Office hours are 8:00 a.m. to 4:30 p.m. Monday - Friday. Please note that voicemails left after 4:00 p.m. may not be returned until the following business day.  We are closed weekends and major holidays. You have access to a nurse at all times for urgent questions. Please call the main number to the clinic Dept: [phone] and follow the prompts.   For any non-urgent questions, you may also contact your provider using MyChart. We now offer e-Visits for anyone 18 and older to request care online for non-urgent symptoms. For details visit [email or web address].   Also download the MyChart app! Go to the app store, search "MyChart", open the app, select [facility], and log in with your MyChart username and password.  Masks are optional in the cancer centers. If you would like for your care team to wear a mask while they are taking care of you, please let them know. You may have one support person who is at least 55 years old accompany you for your appointments.  

## 2022-02-01 NOTE — Progress Notes (Signed)
Patient seen by Lisa Thomas NP today  Vitals are within treatment parameters.  Labs reviewed by Lisa Thomas NP and are within treatment parameters.  Per physician team, patient is ready for treatment and there are NO modifications to the treatment plan.     

## 2022-02-01 NOTE — Patient Instructions (Signed)

## 2022-02-08 IMAGING — CT CT CHEST W/O CM
2 of 4 series · 15 of 36 positions shown, 18 images · non-contrast
Comparison: 11/22/2019 chest CT.

CLINICAL DATA: Colorectal cancer with ongoing chemotherapy.
Restaging.

EXAM:
CT CHEST WITHOUT CONTRAST
TECHNIQUE: Multidetector CT imaging of the chest was performed following the
standard protocol without IV contrast.

[Series 2: thorax · axial · 0.97mm/px · z∈[+1308,+1596]mm · 12 of 170 slices shown, 15 images]
[im 13/170  mediastinal]
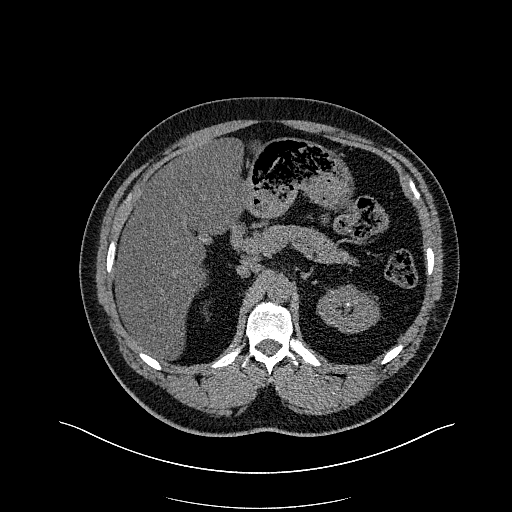
[im 13/170  lung]
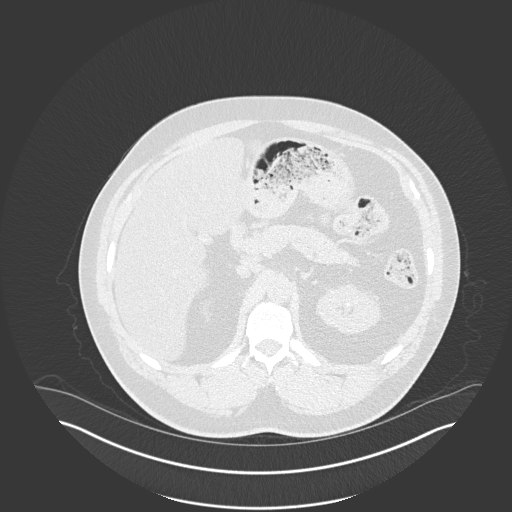
[im 25/170  lung]
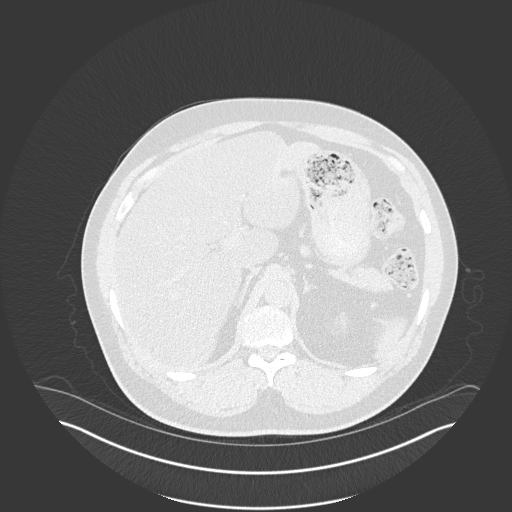
[im 37/170  lung]
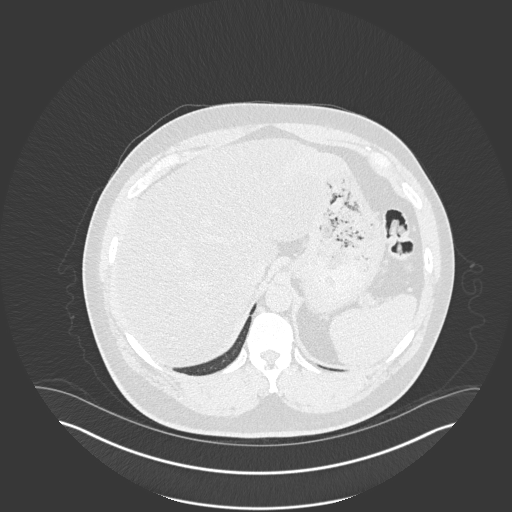
[im 49/170  lung]
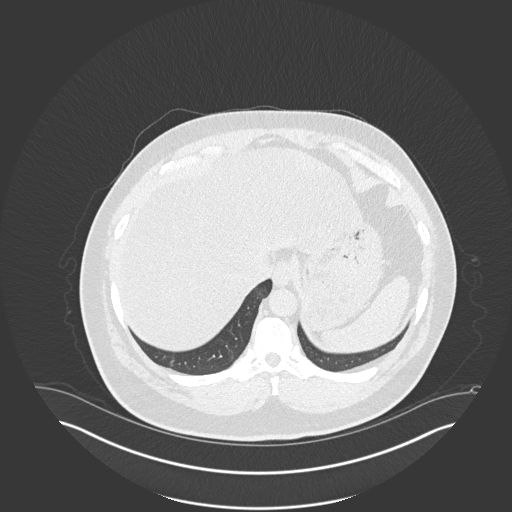
[im 61/170  mediastinal]
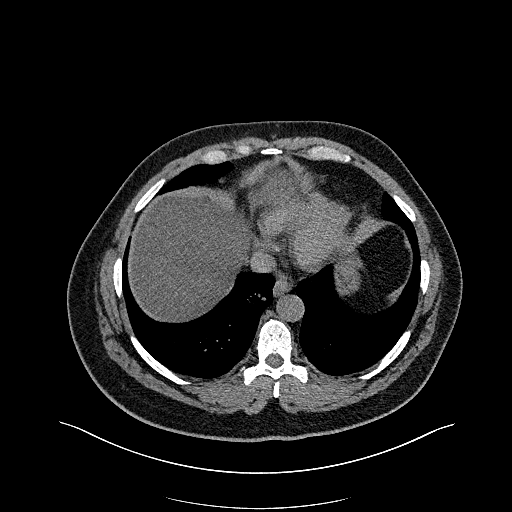
[im 61/170  lung]
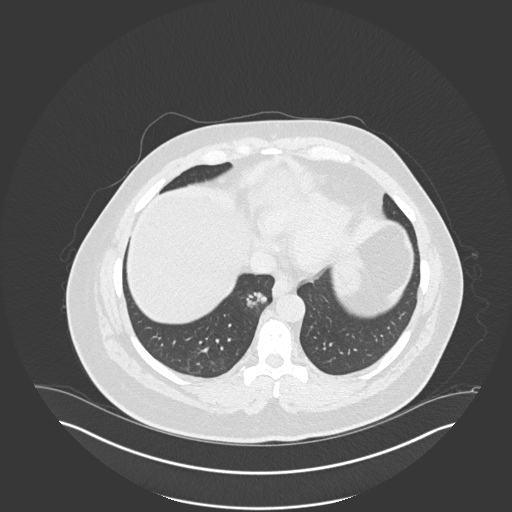
[im 73/170  lung]
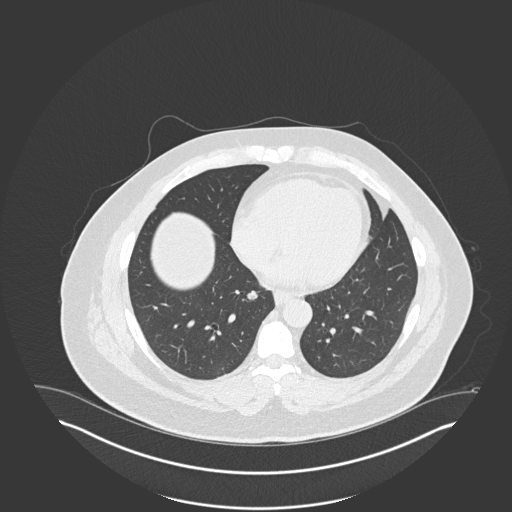
[im 97/170  lung]
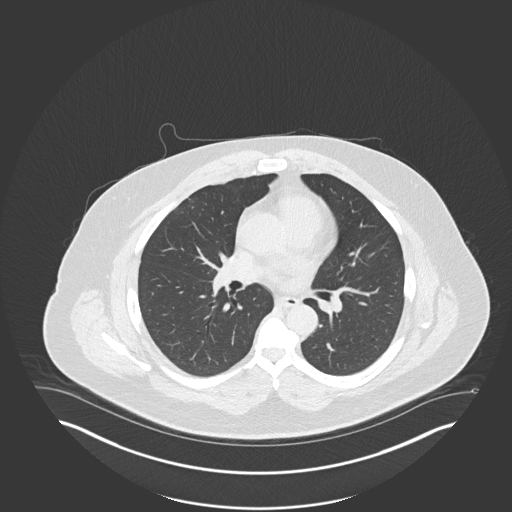
[im 109/170  lung]
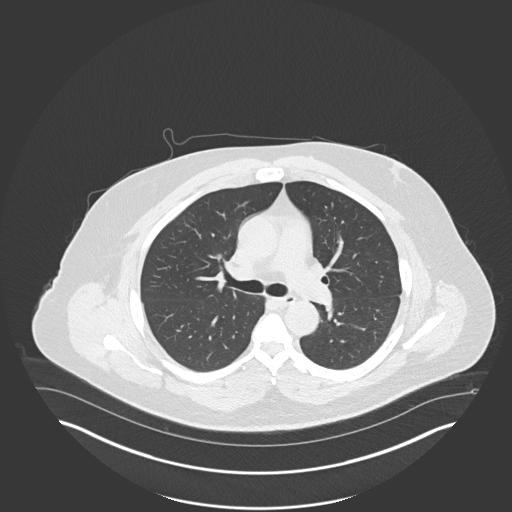
[im 121/170  mediastinal]
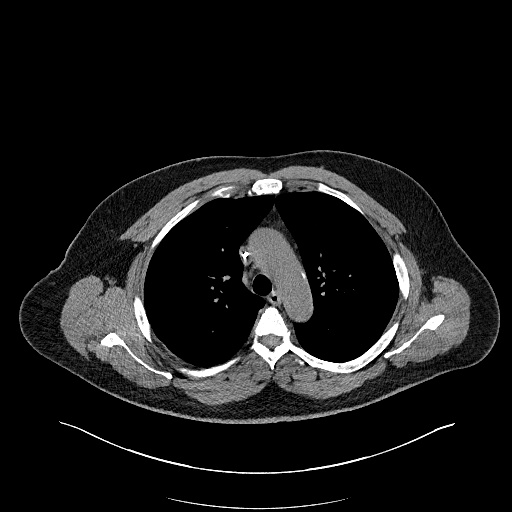
[im 121/170  lung]
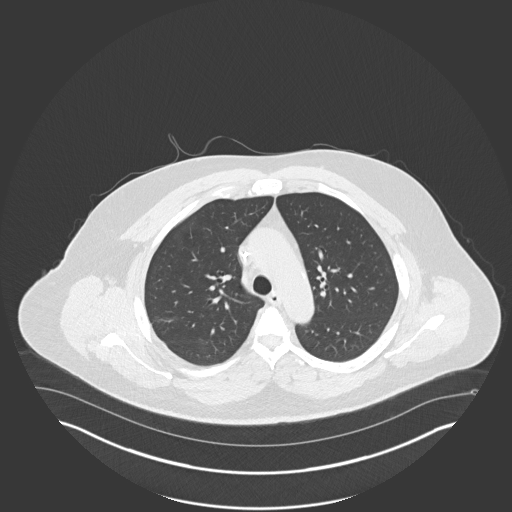
[im 133/170  lung]
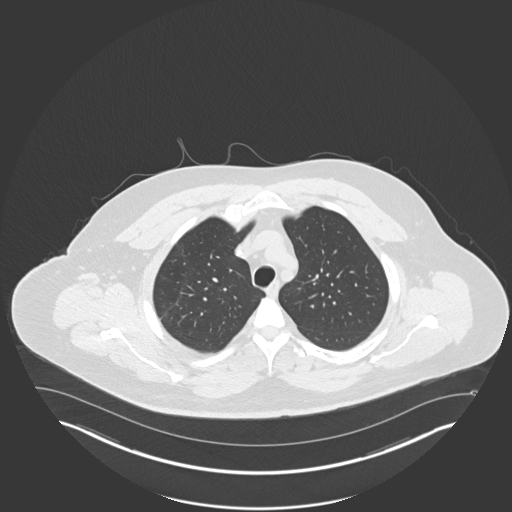
[im 145/170  lung]
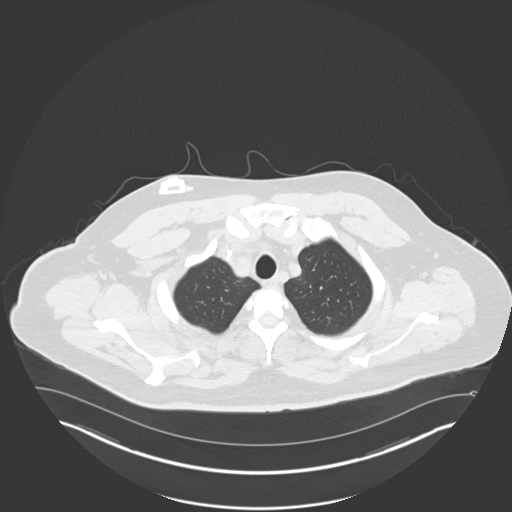
[im 157/170  lung]
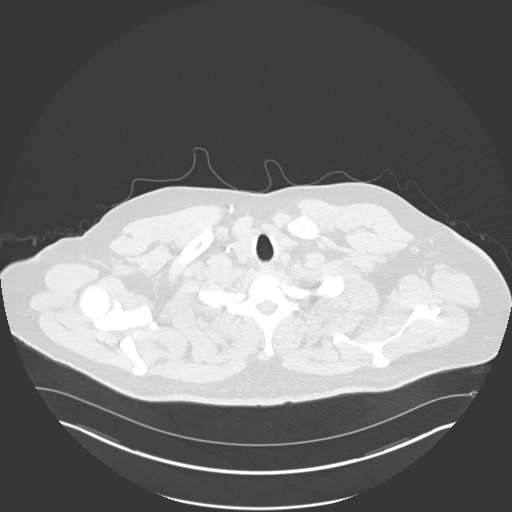

[Series 6: coronal · coronal · 0.68mm/px · 3 of 173 slices shown]
[im 35/173  lung]
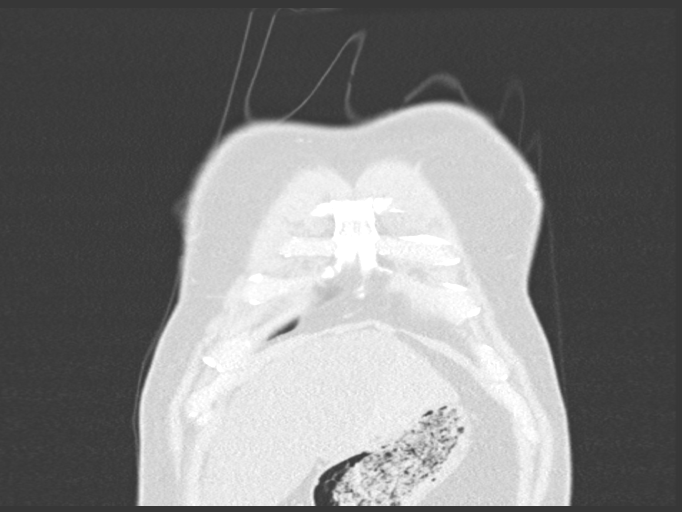
[im 69/173  lung]
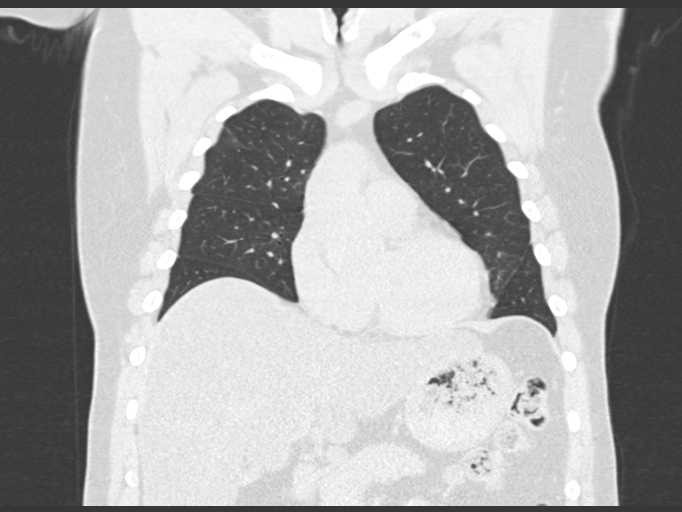
[im 104/173  lung]
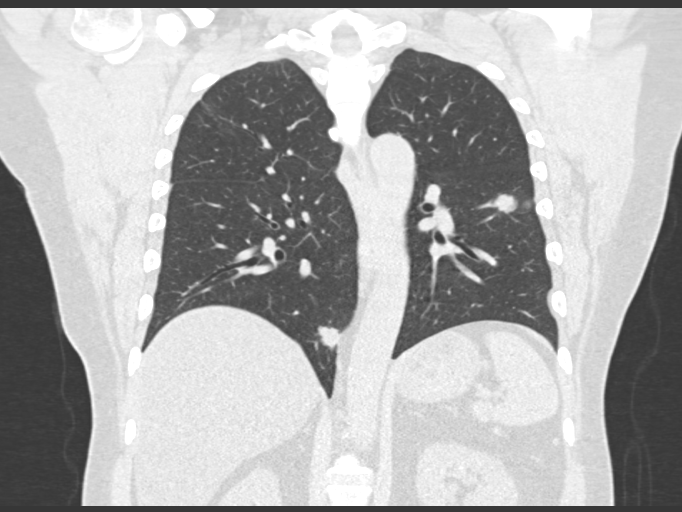

[15 of 36 positions shown; findings below may reference images not displayed]

FINDINGS: Cardiovascular: Normal heart size. No significant pericardial
effusion/thickening. Right internal jugular Port-A-Cath terminates
in the middle third of the SVC. Atherosclerotic nonaneurysmal
thoracic aorta. Normal caliber pulmonary arteries.

Mediastinum/Nodes: No discrete thyroid nodules. Unremarkable
esophagus. No pathologically enlarged axillary, mediastinal or hilar
lymph nodes, noting limited sensitivity for the detection of hilar
adenopathy on this noncontrast study.

Lungs/Pleura: No pneumothorax. No pleural effusion. No acute
consolidative airspace disease. Multiple (at least 7) solid
pulmonary nodules scattered in both lungs, unchanged in number and
not significantly changed in size. Representative 3.2 cm posterior
lingular nodule (series 5/image 77), previously 3.1 cm.
Representative medial basilar right lower lobe 2.5 cm nodule (series
5/image 110), previously 2.6 cm. Representative right middle lobe
2.9 cm nodule (series 5/image 76), previously 2.8 cm. Representative
1.6 cm posterior right lower lobe nodule (series 5/image 90),
previously 1.5 cm. No new significant pulmonary nodules.

Upper abdomen: Diffuse hepatic steatosis. Nonobstructing 3 mm upper
left renal stone.

Musculoskeletal: No aggressive appearing focal osseous lesions. Mild
thoracic spondylosis.
IMPRESSION: 1. Stable bilateral pulmonary metastases. No new or progressive
metastatic disease in the chest.
2. Diffuse hepatic steatosis.
3. Nonobstructing left nephrolithiasis.
4. Aortic Atherosclerosis ([id number]).

## 2022-02-13 ENCOUNTER — Other Ambulatory Visit: Payer: Self-pay | Admitting: Oncology

## 2022-02-13 DIAGNOSIS — C187 Malignant neoplasm of sigmoid colon: Secondary | ICD-10-CM

## 2022-02-14 ENCOUNTER — Inpatient Hospital Stay: Payer: Medicaid Other | Attending: Oncology

## 2022-02-14 ENCOUNTER — Inpatient Hospital Stay: Payer: Medicaid Other

## 2022-02-14 ENCOUNTER — Inpatient Hospital Stay (HOSPITAL_BASED_OUTPATIENT_CLINIC_OR_DEPARTMENT_OTHER): Payer: Medicaid Other

## 2022-02-14 VITALS — BP 140/75 | HR 93 | Temp 98.4°F | Resp 18 | Ht 70.0 in | Wt 225.1 lb

## 2022-02-14 DIAGNOSIS — Z23 Encounter for immunization: Secondary | ICD-10-CM | POA: Insufficient documentation

## 2022-02-14 DIAGNOSIS — C187 Malignant neoplasm of sigmoid colon: Secondary | ICD-10-CM | POA: Diagnosis present

## 2022-02-14 DIAGNOSIS — Z452 Encounter for adjustment and management of vascular access device: Secondary | ICD-10-CM | POA: Diagnosis not present

## 2022-02-14 DIAGNOSIS — Z5112 Encounter for antineoplastic immunotherapy: Secondary | ICD-10-CM | POA: Insufficient documentation

## 2022-02-14 DIAGNOSIS — C787 Secondary malignant neoplasm of liver and intrahepatic bile duct: Secondary | ICD-10-CM | POA: Diagnosis present

## 2022-02-14 LAB — CBC WITH DIFFERENTIAL (CANCER CENTER ONLY)
Abs Immature Granulocytes: 0 10*3/uL (ref 0.00–0.07)
Basophils Absolute: 0 10*3/uL (ref 0.0–0.1)
Basophils Relative: 0 %
Eosinophils Absolute: 0.1 10*3/uL (ref 0.0–0.5)
Eosinophils Relative: 1 %
HCT: 35.8 % — ABNORMAL LOW (ref 39.0–52.0)
Hemoglobin: 11.6 g/dL — ABNORMAL LOW (ref 13.0–17.0)
Immature Granulocytes: 0 %
Lymphocytes Relative: 55 %
Lymphs Abs: 2.2 10*3/uL (ref 0.7–4.0)
MCH: 30 pg (ref 26.0–34.0)
MCHC: 32.4 g/dL (ref 30.0–36.0)
MCV: 92.5 fL (ref 80.0–100.0)
Monocytes Absolute: 0.2 10*3/uL (ref 0.1–1.0)
Monocytes Relative: 5 %
Neutro Abs: 1.6 10*3/uL — ABNORMAL LOW (ref 1.7–7.7)
Neutrophils Relative %: 39 %
Platelet Count: 60 10*3/uL — ABNORMAL LOW (ref 150–400)
RBC: 3.87 MIL/uL — ABNORMAL LOW (ref 4.22–5.81)
RDW: 16.6 % — ABNORMAL HIGH (ref 11.5–15.5)
WBC Count: 4 10*3/uL (ref 4.0–10.5)
nRBC: 0 % (ref 0.0–0.2)

## 2022-02-14 LAB — CMP (CANCER CENTER ONLY)
ALT: 78 U/L — ABNORMAL HIGH (ref 0–44)
AST: 67 U/L — ABNORMAL HIGH (ref 15–41)
Albumin: 4.3 g/dL (ref 3.5–5.0)
Alkaline Phosphatase: 74 U/L (ref 38–126)
Anion gap: 12 (ref 5–15)
BUN: 8 mg/dL (ref 6–20)
CO2: 25 mmol/L (ref 22–32)
Calcium: 9.1 mg/dL (ref 8.9–10.3)
Chloride: 102 mmol/L (ref 98–111)
Creatinine: 0.99 mg/dL (ref 0.61–1.24)
GFR, Estimated: >60 mL/min (ref >60)
Glucose, Bld: 270 mg/dL — ABNORMAL HIGH (ref 70–99)
Potassium: 3.9 mmol/L (ref 3.5–5.1)
Sodium: 139 mmol/L (ref 135–145)
Total Bilirubin: 0.6 mg/dL (ref 0.3–1.2)
Total Protein: 7.2 g/dL (ref 6.5–8.1)

## 2022-02-14 LAB — TOTAL PROTEIN, URINE DIPSTICK: Protein, ur: NEGATIVE mg/dL

## 2022-02-14 MED ORDER — SODIUM CHLORIDE 0.9 % IV SOLN
5.0000 mg/kg | Freq: Once | INTRAVENOUS | Status: AC
  Administered 2022-02-14: 500 mg via INTRAVENOUS
  Filled 2022-02-14: qty 16

## 2022-02-14 MED ORDER — SODIUM CHLORIDE 0.9% FLUSH: 10.0000 mL | INTRAVENOUS | Status: DC | PRN

## 2022-02-14 MED ORDER — HEPARIN SOD (PORK) LOCK FLUSH 100 UNIT/ML IV SOLN: 500.0000 [IU] | Freq: Once | INTRAVENOUS | Status: DC | PRN

## 2022-02-14 MED ORDER — SODIUM CHLORIDE 0.9 % IV SOLN: Freq: Once | INTRAVENOUS | Status: AC

## 2022-02-14 NOTE — Progress Notes (Signed)
Per MD Benay Spice, ok to treat with platelets of 60 K/uL today

## 2022-02-14 NOTE — Patient Instructions (Signed)
[facility]   Discharge Instructions: Thank you for choosing [facility] to provide your oncology and hematology care.   If you have a lab appointment with the [facility], please go directly to the [facility] and check in at the registration area.   Wear comfortable clothing and clothing appropriate for easy access to any Portacath or PICC line.   We strive to give you quality time with your provider. You may need to reschedule your appointment if you arrive late (15 or more minutes).  Arriving late affects you and other patients whose appointments are after yours.  Also, if you miss three or more appointments without notifying the office, you may be dismissed from the clinic at the provider's discretion.      For prescription refill requests, have your pharmacy contact our office and allow 72 hours for refills to be completed.    Today you received the following chemotherapy and/or immunotherapy agents Bevacizumab-awwb (MVASI).      To help prevent nausea and vomiting after your treatment, we encourage you to take your nausea medication as directed.  BELOW ARE SYMPTOMS THAT SHOULD BE REPORTED IMMEDIATELY: *FEVER GREATER THAN 100.4 F (38 C) OR HIGHER *CHILLS OR SWEATING *NAUSEA AND VOMITING THAT IS NOT CONTROLLED WITH YOUR NAUSEA MEDICATION *UNUSUAL SHORTNESS OF BREATH *UNUSUAL BRUISING OR BLEEDING *URINARY PROBLEMS (pain or burning when urinating, or frequent urination) *BOWEL PROBLEMS (unusual diarrhea, constipation, pain near the anus) TENDERNESS IN MOUTH AND THROAT WITH OR WITHOUT PRESENCE OF ULCERS (sore throat, sores in mouth, or a toothache) UNUSUAL RASH, SWELLING OR PAIN  UNUSUAL VAGINAL DISCHARGE OR ITCHING   Items with * indicate a potential emergency and should be followed up as soon as possible or go to the Emergency Department if any problems should occur.  Please show the CHEMOTHERAPY ALERT CARD or IMMUNOTHERAPY ALERT CARD  at check-in to the Emergency Department and triage nurse.  Should you have questions after your visit or need to cancel or reschedule your appointment, please contact [facility]  Dept: [phone]  and follow the prompts.  Office hours are 8:00 a.m. to 4:30 p.m. Monday - Friday. Please note that voicemails left after 4:00 p.m. may not be returned until the following business day.  We are closed weekends and major holidays. You have access to a nurse at all times for urgent questions. Please call the main number to the clinic Dept: [phone] and follow the prompts.   For any non-urgent questions, you may also contact your provider using MyChart. We now offer e-Visits for anyone 45 and older to request care online for non-urgent symptoms. For details visit [email or web address].   Also download the MyChart app! Go to the app store, search "MyChart", open the app, select [facility], and log in with your MyChart username and password.  Masks are optional in the cancer centers. If you would like for your care team to wear a mask while they are taking care of you, please let them know. You may have one support person who is at least 55 years old accompany you for your appointments.  Bevacizumab Injection What is this medication? BEVACIZUMAB (be va SIZ yoo mab) treats some types of cancer. It works by blocking a protein that causes cancer cells to grow and multiply. This helps to slow or stop the spread of cancer cells. It is a monoclonal antibody. This medicine may be used for other purposes; ask your health care  provider or pharmacist if you have questions. COMMON BRAND NAME(S): Alymsys, Avastin, MVASI, [name] What should I tell my care team before I take this medication? They need to know if you have any of these conditions: Blood clots Coughing up blood Having or recent surgery Heart failure High blood pressure History of a connection between 2 or more body  parts that do not usually connect (fistula) History of a tear in your stomach or intestines Protein in your urine An unusual or allergic reaction to bevacizumab, other medications, foods, dyes, or preservatives Pregnant or trying to get pregnant Breast-feeding How should I use this medication? This medication is injected into a vein. It is given by your care team in a hospital or clinic setting. Talk to your care team the use of this medication in children. Special care may be needed. Overdosage: If you think you have taken too much of this medicine contact a poison control center or emergency room at once. NOTE: This medicine is only for you. Do not share this medicine with others. What if I miss a dose? Keep appointments for follow-up doses. It is important not to miss your dose. Call your care team if you are unable to keep an appointment. What may interact with this medication? Interactions are not expected. This list may not describe all possible interactions. Give your health care provider a list of all the medicines, herbs, non-prescription drugs, or dietary supplements you use. Also tell them if you smoke, drink alcohol, or use illegal drugs. Some items may interact with your medicine. What should I watch for while using this medication? Your condition will be monitored carefully while you are receiving this medication. You may need blood work while taking this medication. This medication may make you feel generally unwell. This is not uncommon as chemotherapy can affect healthy cells as well as cancer cells. Report any side effects. Continue your course of treatment even though you feel ill unless your care team tells you to stop. This medication may increase your risk to bruise or bleed. Call your care team if you notice any unusual bleeding. Before having surgery, talk to your care team to make sure it is ok. This medication can increase the risk of poor healing of your surgical site  or wound. You will need to stop this medication for 28 days before surgery. After surgery, wait at least 28 days before restarting this medication. Make sure the surgical site or wound is healed enough before restarting this medication. Talk to your care team if questions. Talk to your care team if you may be pregnant. Serious birth defects can occur if you take this medication during pregnancy and for 6 months after the last dose. Contraception is recommended while taking this medication and for 6 months after the last dose. Your care team can help you find the option that works for you. Do not breastfeed while taking this medication and for 6 months after the last dose. This medication can cause infertility. Talk to your care team if you are concerned about your fertility. What side effects may I notice from receiving this medication? Side effects that you should report to your care team as soon as possible: Allergic reactions--skin rash, itching, hives, swelling of the face, lips, tongue, or throat Bleeding--bloody or black, tar-like stools, vomiting blood or brown material that looks like coffee grounds, red or dark brown urine, small red or purple spots on skin, unusual bruising or bleeding Blood clot--pain, swelling, or warmth in  leg, shortness of breath, chest pain Heart attack--pain or tightness in the chest, shoulders, arms, or jaw, nausea, shortness of breath, cold or clammy skin, feeling faint or lightheaded Heart failure--shortness of breath, swelling of the ankles, feet, or hands, sudden weight gain, unusual weakness or fatigue Increase in blood pressure Infection--fever, chills, cough, sore throat, wounds that don't heal, pain or trouble when passing urine, general feeling of discomfort or being unwell Infusion reactions--chest pain, shortness of breath or trouble breathing, feeling faint or lightheaded Kidney injury--decrease in the amount of urine, swelling of the ankles, hands, or  feet Stomach pain that is severe, does not go away, or gets worse Stroke--sudden numbness or weakness of the face, arm, or leg, trouble speaking, confusion, trouble walking, loss of balance or coordination, dizziness, severe headache, change in vision Sudden and severe headache, confusion, change in vision, seizures, which may be signs of posterior reversible encephalopathy syndrome (PRES) Side effects that usually do not require medical attention (report to your care team if they continue or are bothersome): Back pain Change in taste Diarrhea Dry skin Increased tears Nosebleed This list may not describe all possible side effects. Call your doctor for medical advice about side effects. You may report side effects to FDA at 1-800-FDA-1088. Where should I keep my medication? This medication is given in a hospital or clinic. It will not be stored at home. NOTE: This sheet is a summary. It may not cover all possible information. If you have questions about this medicine, talk to your doctor, pharmacist, or health care provider.  2023 Elsevier/Gold Standard (2021-09-14 00:00:00) 

## 2022-02-16 ENCOUNTER — Other Ambulatory Visit (HOSPITAL_COMMUNITY): Payer: Self-pay

## 2022-02-17 ENCOUNTER — Other Ambulatory Visit (HOSPITAL_COMMUNITY): Payer: Self-pay

## 2022-02-20 ENCOUNTER — Other Ambulatory Visit: Payer: Medicaid Other

## 2022-02-21 ENCOUNTER — Other Ambulatory Visit (HOSPITAL_COMMUNITY): Payer: Self-pay

## 2022-02-21 ENCOUNTER — Telehealth: Payer: Self-pay | Admitting: Cardiovascular Disease

## 2022-02-21 MED ORDER — ATORVASTATIN CALCIUM 80 MG PO TABS: ORAL_TABLET | ORAL | 5 refills | Status: DC

## 2022-02-21 NOTE — Telephone Encounter (Signed)
*  STAT* If patient is at the pharmacy, call can be transferred to refill team.   1. Which medications need to be refilled? (please list name of each medication and dose if known)   atorvastatin (LIPITOR) 80 MG tablet    2. Which pharmacy/location (including street and city if local pharmacy) is medication to be sent to? [place], [address]  3. Do they need a 30 day or 90 day supply? 90 day   Pt has scheduled appt for 10/31. Please advise

## 2022-02-23 ENCOUNTER — Other Ambulatory Visit (HOSPITAL_COMMUNITY): Payer: Self-pay

## 2022-02-23 ENCOUNTER — Other Ambulatory Visit: Payer: Self-pay | Admitting: Oncology

## 2022-02-23 DIAGNOSIS — C189 Malignant neoplasm of colon, unspecified: Secondary | ICD-10-CM

## 2022-02-23 MED ORDER — LONSURF 20-8.19 MG PO TABS
60.0000 mg | ORAL_TABLET | Freq: Two times a day (BID) | ORAL | 0 refills | Status: DC
  Filled 2022-02-23: qty 60, 28d supply, fill #0

## 2022-02-24 ENCOUNTER — Other Ambulatory Visit (HOSPITAL_COMMUNITY): Payer: Self-pay

## 2022-02-27 ENCOUNTER — Other Ambulatory Visit: Payer: Self-pay | Admitting: Oncology

## 2022-02-28 ENCOUNTER — Other Ambulatory Visit: Payer: Medicaid Other

## 2022-02-28 ENCOUNTER — Encounter: Payer: Self-pay | Admitting: *Deleted

## 2022-02-28 ENCOUNTER — Inpatient Hospital Stay: Payer: Medicaid Other

## 2022-02-28 ENCOUNTER — Inpatient Hospital Stay: Payer: Medicaid Other | Admitting: Oncology

## 2022-02-28 ENCOUNTER — Ambulatory Visit: Payer: Medicaid Other

## 2022-02-28 ENCOUNTER — Ambulatory Visit: Payer: Medicaid Other | Admitting: Oncology

## 2022-02-28 VITALS — BP 144/80 | HR 100 | Temp 98.2°F | Resp 18 | Ht 70.0 in | Wt 223.4 lb

## 2022-02-28 VITALS — BP 137/88 | HR 88

## 2022-02-28 DIAGNOSIS — C187 Malignant neoplasm of sigmoid colon: Secondary | ICD-10-CM

## 2022-02-28 DIAGNOSIS — Z23 Encounter for immunization: Secondary | ICD-10-CM

## 2022-02-28 DIAGNOSIS — Z5112 Encounter for antineoplastic immunotherapy: Secondary | ICD-10-CM | POA: Diagnosis not present

## 2022-02-28 LAB — CBC WITH DIFFERENTIAL (CANCER CENTER ONLY)
Abs Immature Granulocytes: 0 10*3/uL (ref 0.00–0.07)
Basophils Absolute: 0 10*3/uL (ref 0.0–0.1)
Basophils Relative: 0 %
Eosinophils Absolute: 0.1 10*3/uL (ref 0.0–0.5)
Eosinophils Relative: 1 %
HCT: 36 % — ABNORMAL LOW (ref 39.0–52.0)
Hemoglobin: 11.7 g/dL — ABNORMAL LOW (ref 13.0–17.0)
Immature Granulocytes: 0 %
Lymphocytes Relative: 52 %
Lymphs Abs: 1.8 10*3/uL (ref 0.7–4.0)
MCH: 30.2 pg (ref 26.0–34.0)
MCHC: 32.5 g/dL (ref 30.0–36.0)
MCV: 92.8 fL (ref 80.0–100.0)
Monocytes Absolute: 0.3 10*3/uL (ref 0.1–1.0)
Monocytes Relative: 10 %
Neutro Abs: 1.3 10*3/uL — ABNORMAL LOW (ref 1.7–7.7)
Neutrophils Relative %: 37 %
Platelet Count: 101 10*3/uL — ABNORMAL LOW (ref 150–400)
RBC: 3.88 MIL/uL — ABNORMAL LOW (ref 4.22–5.81)
RDW: 17.3 % — ABNORMAL HIGH (ref 11.5–15.5)
WBC Count: 3.5 10*3/uL — ABNORMAL LOW (ref 4.0–10.5)
nRBC: 0 % (ref 0.0–0.2)

## 2022-02-28 LAB — CMP (CANCER CENTER ONLY)
ALT: 52 U/L — ABNORMAL HIGH (ref 0–44)
AST: 30 U/L (ref 15–41)
Albumin: 4.2 g/dL (ref 3.5–5.0)
Alkaline Phosphatase: 91 U/L (ref 38–126)
Anion gap: 14 (ref 5–15)
BUN: 9 mg/dL (ref 6–20)
CO2: 24 mmol/L (ref 22–32)
Calcium: 8.8 mg/dL — ABNORMAL LOW (ref 8.9–10.3)
Chloride: 100 mmol/L (ref 98–111)
Creatinine: 0.94 mg/dL (ref 0.61–1.24)
GFR, Estimated: >60 mL/min (ref >60)
Glucose, Bld: 201 mg/dL — ABNORMAL HIGH (ref 70–99)
Potassium: 3.5 mmol/L (ref 3.5–5.1)
Sodium: 138 mmol/L (ref 135–145)
Total Bilirubin: 0.6 mg/dL (ref 0.3–1.2)
Total Protein: 7.7 g/dL (ref 6.5–8.1)

## 2022-02-28 LAB — TOTAL PROTEIN, URINE DIPSTICK: Protein, ur: NEGATIVE mg/dL

## 2022-02-28 MED ORDER — HEPARIN SOD (PORK) LOCK FLUSH 100 UNIT/ML IV SOLN
500.0000 [IU] | Freq: Once | INTRAVENOUS | Status: AC | PRN
  Administered 2022-02-28: 500 [IU]

## 2022-02-28 MED ORDER — SODIUM CHLORIDE 0.9 % IV SOLN
5.0000 mg/kg | Freq: Once | INTRAVENOUS | Status: AC
  Administered 2022-02-28: 500 mg via INTRAVENOUS
  Filled 2022-02-28: qty 4

## 2022-02-28 MED ORDER — SODIUM CHLORIDE 0.9% FLUSH
10.0000 mL | INTRAVENOUS | Status: DC | PRN
  Administered 2022-02-28: 10 mL

## 2022-02-28 MED ORDER — INFLUENZA VAC SPLIT QUAD 0.5 ML IM SUSY
0.5000 mL | PREFILLED_SYRINGE | Freq: Once | INTRAMUSCULAR | Status: AC
  Administered 2022-02-28: 0.5 mL via INTRAMUSCULAR
  Filled 2022-02-28: qty 0.5

## 2022-02-28 MED ORDER — SODIUM CHLORIDE 0.9 % IV SOLN: Freq: Once | INTRAVENOUS | Status: AC

## 2022-02-28 NOTE — Progress Notes (Signed)
[facility] OFFICE PROGRESS NOTE   Diagnosis: Colon cancer  INTERVAL HISTORY:   Jeff [name] began another cycle of Lonsurf today.  He continues every 2-week Avastin.  No bleeding or symptom of thrombosis.  He has a good appetite.  He relates weight loss to eliminating sweets in his diet.  Objective:  Vital signs in last 24 hours:  Blood pressure (!) 144/80, pulse 100, temperature 98.2 F (36.8 C), temperature source Oral, resp. rate 18, height $RemoveBe'5\' 10"'JwhXKrBKj$  (1.778 m), weight 223 lb 6.4 oz (101.3 kg), SpO2 100 %.    HEENT: No thrush or ulcers Resp: Lungs clear bilaterally Cardio: Regular rate and rhythm GI: No hepatosplenomegaly, no mass, nontender Vascular: No leg edema     Portacath/PICC-without erythema  Lab Results:  Lab Results  Component Value Date   WBC 3.5 (L) 02/28/2022   HGB 11.7 (L) 02/28/2022   HCT 36.0 (L) 02/28/2022   MCV 92.8 02/28/2022   PLT 101 (L) 02/28/2022   NEUTROABS 1.3 (L) 02/28/2022    CMP  Lab Results  Component Value Date   NA 138 02/28/2022   K 3.5 02/28/2022   CL 100 02/28/2022   CO2 24 02/28/2022   GLUCOSE 201 (H) 02/28/2022   BUN 9 02/28/2022   CREATININE 0.94 02/28/2022   CALCIUM 8.8 (L) 02/28/2022   PROT 7.7 02/28/2022   ALBUMIN 4.2 02/28/2022   AST 30 02/28/2022   ALT 52 (H) 02/28/2022   ALKPHOS 91 02/28/2022   BILITOT 0.6 02/28/2022   GFRNONAA >60 02/28/2022   GFRAA >60 01/30/2020    Lab Results  Component Value Date   CEA1 3.19 09/10/2020   CEA 85.25 (H) 02/01/2022     Medications: I have reviewed the patient's current medications.   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, biopsy confirmed adenocarcinoma         CTs chest, abdomen, and pelvis on 12/11/2015-indeterminate tiny pulmonary nodules, multiple liver metastases, small nodes in the sigmoid mesocolon Laparoscopic sigmoid colectomy, liver biopsy, Port-A-Cath placement  01/14/2016 Pathology sigmoid colon resection- colonic adenocarcinoma, 5.3 cm extending into pericolonic connective tissue, positive lymph-vascular invasion, positive perineural invasion, negative margins, metastatic carcinoma in 9 of 28 lymph nodes Pathology liver biopsy-metastatic colorectal adenocarcinoma MSI stable; mismatch repair protein normal APC alteration, K RAS wild-type, no BRAF mutation Cycle 1 FOLFOX 02/02/2016 Cycle 2 FOLFOX 02/15/2016 Cycle 3 FOLFOX 02/29/2016 Cycle 4 FOLFOX 03/14/2016 Cycle 5 FOLFOX 03/28/2016 Cycle 6 FOLFOX 04/11/2016 (oxaliplatin held secondary to thrombocytopenia) 04/21/2016 restaging CTs-difficulty evaluating liver lesions due to hepatic steatosis. Stable right adrenal nodule. No adenopathy or local recurrence near the rectosigmoid anastomotic site. Cycle 7 FOLFOX 04/25/2016 MRI liver 05/02/2016-partial improvement in hepatic metastases Cycle 8 FOLFOX 05/10/2016 Cycle 9 FOLFOX 05/23/2016 (oxaliplatin held due to thrombocytopenia) Cycle 10 FOLFOX 06/06/2016 (oxaliplatin dose reduced due to thrombocytopenia) Cycle 11 FOLFOX 06/27/2016 (oxaliplatin held due to neuropathy) Cycle 12 FOLFOX 07/11/2016 (oxaliplatin held) Initiation of maintenance Xeloda 7 days on/7 days off 07/27/2016 MRI liver 11/18/2016-decrease in hepatic metastatic disease. No new or progressive disease identified within the abdomen. Continuation of Xeloda 7 days on/7 days off MRI liver 04/27/2017-previous liver lesions not identified, no new lesions, no lymphadenopathy Xeloda continued 7 days on/7 days off MRI liver 12/04/2017 - no evidence of metastatic disease, hepatic steatosis Xeloda continued 7 days on/7 days off MRI liver 07/15/2018- no evidence of metastatic disease.  Stable severe hepatic steatosis. Xeloda continued 7 days on/7 days off MRI liver 03/16/2019-hepatic  steatosis, no liver mass, focal area of intrahepatic biliary dilatation in segments 2 and 3 of the left  lobe-increased Xeloda continued 7 days on/7 days off MRI abdomen 08/19/2019-no findings to suggest liver metastases.  Bilateral lung nodules measuring up to 2.8 cm, progressive and more conspicuous than on previous exam CT chest 08/29/2019-multiple pulmonary metastases, new from 04/21/2016 Cycle 1 FOLFIRI/bevacizumab 09/09/2019 Cycle 2 FOLFIRI/bevacizumab 09/26/2019  Cycle 3 FOLFIRI/bevacizumab 10/10/2019 Cycle 4 FOLFIRI/bevacizumab 10/24/2019 Cycle 5 FOLFIRI/bevacizumab 11/07/2019 CT chest 11/14/2019-decreased size of lung nodules, no new lesions, hepatic steatosis Cycle 6 FOLFIRI/bevacizumab 11/21/2019 Cycle 7 FOLFIRI/bevacizumab 12/05/2019 Cycle 8 FOLFIRI/bevacizumab 12/19/2019 Cycle 9 FOLFIRI/bevacizumab 01/02/2020 Cycle 10 FOLFIRI/bevacizumab 01/16/2020 CT chest 01/29/2020-stable bilateral pulmonary metastases.  No new or progressive metastatic disease in the chest. Cycle 11 FOLFIRI/bevacizumab 01/30/2020 Cycle 12 FOLFIRI/bevacizumab 02/19/2020 Cycle 13 FOLFIRI/bevacizumab 03/12/2020 Cycle 14 FOLFIRI/bevacizumab 04/02/2020 Cycle 15 FOLFIRI/bevacizumab 04/23/2020 Cycle 16 FOLFIRI/bevacizumab 05/21/2020 CT chest 06/09/2020-mild progression pulmonary metastasis.  Some lesions have increased in size while others are similar. Cycle 1 irinotecan/Panitumumab 06/18/2020 Cycle 2 irinotecan/Panitumumab 07/02/2020 Cycle 3 irinotecan/panitumumab 07/16/2020 Cycle 4 irinotecan/Panitumumab 07/30/2020 Cycle 5 irinotecan/Panitumumab 08/13/2020, Emend added Cycle 6 irinotecan/Panitumumab 08/27/2020 CT chest 09/08/2020-decreased size of pulmonary nodules, no progressive disease Cycle 7 irinotecan/panitumumab 09/02/2020 Cycle 8 irinotecan/panitumumab 09/24/2020 Cycle 9 irinotecan/Panitumumab 10/08/2020 10/22/2020 treatment held due to left foot fracture, need for surgery Cycle 10 irinotecan/panitumumab 11/24/2020 Cycle 11 irinotecan/Panitumumab 12/08/2020 Cycle 12 irinotecan/panitumumab 12/22/2020 Cycle 13 irinotecan/panitumumab  01/05/2021 01/20/2021 CT chest-mixed response with minimal increase in size of some lesions and minimal decrease in the size of other lesions.  Overall number of lesions is unchanged. Cycle 14 irinotecan/Panitumumab 02/05/2021 Cycle 15 irinotecan/Panitumumab 02/23/2021 Cycle 16 irinotecan/panitumumab 03/16/2021 Cycle 17 irinotecan/Panitumumab 04/13/2021 05/03/2021-CT chest-enlargement of pulmonary metastases, no new lesions 06/21/2021-cycle 1 Lonsurf/bevacizumab 07/19/2021-cycle 2 Lonsurf/bevacizumab 08/16/2021-cycle 3 Lonsurf/bevacizumab 09/09/2021 CT chest-bilateral lung nodules and masses with mixed response, overall stable to very minimally increased 09/13/2021 cycle 4 Lonsurf/bevacizumab 10/12/2021 cycle 5 Lonsurf/bevacizumab 10/21/2021 chest CT-bilateral pulmonary metastases without significant change.  No new or progressive disease within the chest. 11/08/2021 cycle 6 Lonsurf/bevacizumab 12/06/2021 cycle 7 Lonsurf/bevacizumab 01/03/2022 cycle 8 Lonsurf/bevacizumab CTs 01/25/2022-index lung lesion stable to mildly increased in size.  Mild retroperitoneal adenopathy, mildly increased in size compared to 12/16/2020. 02/01/2022 cycle 9 Lonsurf/bevacizumab 02/28/2022 cycle 10 Lonsurf/bevacizumab   2.   Rectal bleeding and constipation secondary to #1   3.   History of peripheral vascular disease, status post left lower extremity vascular bypass surgery in April 2017   4.   History of nephrolithiasis   5.   History of Graves' disease treated with radioactive iodine   6.   Anxiety/depression   7.   Hypertension   8.   Hospitalization 01/19/2016 with wound dehiscence status post secondary suture closure of abdominal wall   9.   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 Hyperglycemia 06/20/2016-diagnosed with diabetes, maintained on insulin   11.  Positive COVID test 12/13/2018; positive COVID test 01/25/2021   12. Hospitalized with seizure  activity/DKA. Now on Keppra, insulin adjusted. 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  Left foot fracture-surgical repair 11/11/2020   Disposition: Mr. Kabler appears stable.  He continues to tolerate the Lonsurf/bevacizumab well.  He has mild neutropenia.  He will call for a fever or symptoms of an infection.  He will complete  another cycle of Lonsurf beginning this week.  He will return for an office visit in 2 weeks.  He will receive an influenza vaccine today.  Betsy Coder, MD  02/28/2022  3:18 PM

## 2022-02-28 NOTE — Progress Notes (Signed)
Patient seen by Dr. Benay Spice today  Vitals are within treatment parameters.  Labs reviewed by Dr. Benay Spice and are not all within treatment parameters. ANC 1.3 --OK to treat.  Per physician team, patient is ready for treatment and there are NO modifications to the treatment plan.

## 2022-02-28 NOTE — Patient Instructions (Addendum)
[facility]   Discharge Instructions: Thank you for choosing [facility] to provide your oncology and hematology care.   If you have a lab appointment with the Cancer Center, please go directly to the Cancer Center and check in at the registration area.   Wear comfortable clothing and clothing appropriate for easy access to any Portacath or PICC line.   We strive to give you quality time with your provider. You may need to reschedule your appointment if you arrive late (15 or more minutes).  Arriving late affects you and other patients whose appointments are after yours.  Also, if you miss three or more appointments without notifying the office, you may be dismissed from the clinic at the provider's discretion.      For prescription refill requests, have your pharmacy contact our office and allow 72 hours for refills to be completed.    Today you received the following chemotherapy and/or immunotherapy agents Bevacizumab       To help prevent nausea and vomiting after your treatment, we encourage you to take your nausea medication as directed.  BELOW ARE SYMPTOMS THAT SHOULD BE REPORTED IMMEDIATELY: *FEVER GREATER THAN 100.4 F (38 C) OR HIGHER *CHILLS OR SWEATING *NAUSEA AND VOMITING THAT IS NOT CONTROLLED WITH YOUR NAUSEA MEDICATION *UNUSUAL SHORTNESS OF BREATH *UNUSUAL BRUISING OR BLEEDING *URINARY PROBLEMS (pain or burning when urinating, or frequent urination) *BOWEL PROBLEMS (unusual diarrhea, constipation, pain near the anus) TENDERNESS IN MOUTH AND THROAT WITH OR WITHOUT PRESENCE OF ULCERS (sore throat, sores in mouth, or a toothache) UNUSUAL RASH, SWELLING OR PAIN  UNUSUAL VAGINAL DISCHARGE OR ITCHING   Items with * indicate a potential emergency and should be followed up as soon as possible or go to the Emergency Department if any problems should occur.  Please show the CHEMOTHERAPY ALERT CARD or IMMUNOTHERAPY ALERT CARD at check-in  to the Emergency Department and triage nurse.  Should you have questions after your visit or need to cancel or reschedule your appointment, please contact [facility]  Dept: [phone]  and follow the prompts.  Office hours are 8:00 a.m. to 4:30 p.m. Monday - Friday. Please note that voicemails left after 4:00 p.m. may not be returned until the following business day.  We are closed weekends and major holidays. You have access to a nurse at all times for urgent questions. Please call the main number to the clinic Dept: [phone] and follow the prompts.   For any non-urgent questions, you may also contact your provider using MyChart. We now offer e-Visits for anyone 18 and older to request care online for non-urgent symptoms. For details visit [email or web address].   Also download the MyChart app! Go to the app store, search "MyChart", open the app, select , and log in with your MyChart username and password.  Masks are optional in the cancer centers. If you would like for your care team to wear a mask while they are taking care of you, please let them know. You may have one support person who is at least 55 years old accompany you for your appointments.  Bevacizumab Injection What is this medication? BEVACIZUMAB (be va SIZ yoo mab) treats some types of cancer. It works by blocking a protein that causes cancer cells to grow and multiply. This helps to slow or stop the spread of cancer cells. It is a monoclonal antibody. This medicine may be used for other purposes; ask your health care   provider or pharmacist if you have questions. COMMON BRAND NAME(S): Alymsys, Avastin, MVASI, Zirabev What should I tell my care team before I take this medication? They need to know if you have any of these conditions: Blood clots Coughing up blood Having or recent surgery Heart failure High blood pressure History of a connection between 2 or more body parts that do  not usually connect (fistula) History of a tear in your stomach or intestines Protein in your urine An unusual or allergic reaction to bevacizumab, other medications, foods, dyes, or preservatives Pregnant or trying to get pregnant Breast-feeding How should I use this medication? This medication is injected into a vein. It is given by your care team in a hospital or clinic setting. Talk to your care team the use of this medication in children. Special care may be needed. Overdosage: If you think you have taken too much of this medicine contact a poison control center or emergency room at once. NOTE: This medicine is only for you. Do not share this medicine with others. What if I miss a dose? Keep appointments for follow-up doses. It is important not to miss your dose. Call your care team if you are unable to keep an appointment. What may interact with this medication? Interactions are not expected. This list may not describe all possible interactions. Give your health care provider a list of all the medicines, herbs, non-prescription drugs, or dietary supplements you use. Also tell them if you smoke, drink alcohol, or use illegal drugs. Some items may interact with your medicine. What should I watch for while using this medication? Your condition will be monitored carefully while you are receiving this medication. You may need blood work while taking this medication. This medication may make you feel generally unwell. This is not uncommon as chemotherapy can affect healthy cells as well as cancer cells. Report any side effects. Continue your course of treatment even though you feel ill unless your care team tells you to stop. This medication may increase your risk to bruise or bleed. Call your care team if you notice any unusual bleeding. Before having surgery, talk to your care team to make sure it is ok. This medication can increase the risk of poor healing of your surgical site or wound. You  will need to stop this medication for 28 days before surgery. After surgery, wait at least 28 days before restarting this medication. Make sure the surgical site or wound is healed enough before restarting this medication. Talk to your care team if questions. Talk to your care team if you may be pregnant. Serious birth defects can occur if you take this medication during pregnancy and for 6 months after the last dose. Contraception is recommended while taking this medication and for 6 months after the last dose. Your care team can help you find the option that works for you. Do not breastfeed while taking this medication and for 6 months after the last dose. This medication can cause infertility. Talk to your care team if you are concerned about your fertility. What side effects may I notice from receiving this medication? Side effects that you should report to your care team as soon as possible: Allergic reactions--skin rash, itching, hives, swelling of the face, lips, tongue, or throat Bleeding--bloody or black, tar-like stools, vomiting blood or brown material that looks like coffee grounds, red or dark brown urine, small red or purple spots on skin, unusual bruising or bleeding Blood clot--pain, swelling, or warmth in   the leg, shortness of breath, chest pain Heart attack--pain or tightness in the chest, shoulders, arms, or jaw, nausea, shortness of breath, cold or clammy skin, feeling faint or lightheaded Heart failure--shortness of breath, swelling of the ankles, feet, or hands, sudden weight gain, unusual weakness or fatigue Increase in blood pressure Infection--fever, chills, cough, sore throat, wounds that don't heal, pain or trouble when passing urine, general feeling of discomfort or being unwell Infusion reactions--chest pain, shortness of breath or trouble breathing, feeling faint or lightheaded Kidney injury--decrease in the amount of urine, swelling of the ankles, hands, or feet Stomach  pain that is severe, does not go away, or gets worse Stroke--sudden numbness or weakness of the face, arm, or leg, trouble speaking, confusion, trouble walking, loss of balance or coordination, dizziness, severe headache, change in vision Sudden and severe headache, confusion, change in vision, seizures, which may be signs of posterior reversible encephalopathy syndrome (PRES) Side effects that usually do not require medical attention (report to your care team if they continue or are bothersome): Back pain Change in taste Diarrhea Dry skin Increased tears Nosebleed This list may not describe all possible side effects. Call your doctor for medical advice about side effects. You may report side effects to FDA at 1-800-FDA-1088. Where should I keep my medication? This medication is given in a hospital or clinic. It will not be stored at home. NOTE: This sheet is a summary. It may not cover all possible information. If you have questions about this medicine, talk to your doctor, pharmacist, or health care provider.  2023 Elsevier/Gold Standard (2021-09-14 00:00:00)  Influenza Virus Vaccine injection What is this medication? INFLUENZA VIRUS VACCINE (in floo EN zuh VAHY ruhs vak SEEN) helps to reduce the risk of getting influenza also known as the flu. The vaccine only helps protect you against some strains of the flu. This medicine may be used for other purposes; ask your health care provider or pharmacist if you have questions. COMMON BRAND NAME(S): Afluria, Afluria Quadrivalent, Agriflu, Alfuria, FLUAD, FLUAD Quadrivalent, Fluarix, Fluarix Quadrivalent, Flublok, Flublok Quadrivalent, FLUCELVAX, FLUCELVAX Quadrivalent, Flulaval, Flulaval Quadrivalent, Fluvirin, Fluzone, Fluzone High-Dose, Fluzone Intradermal, Fluzone Quadrivalent What should I tell my care team before I take this medication? They need to know if you have any of these conditions: bleeding disorder like hemophilia fever or  infection Guillain-Barre syndrome or other neurological problems immune system problems infection with the human immunodeficiency virus (HIV) or AIDS low blood platelet counts multiple sclerosis an unusual or allergic reaction to influenza virus vaccine, latex, other medicines, foods, dyes, or preservatives. Different brands of vaccines contain different allergens. Some may contain latex or eggs. Talk to your doctor about your allergies to make sure that you get the right vaccine. pregnant or trying to get pregnant breast-feeding How should I use this medication? This vaccine is for injection into a muscle or under the skin. It is given by a health care professional. A copy of Vaccine Information Statements will be given before each vaccination. Read this sheet carefully each time. The sheet may change frequently. Talk to your healthcare provider to see which vaccines are right for you. Some vaccines should not be used in all age groups. Overdosage: If you think you have taken too much of this medicine contact a poison control center or emergency room at once. NOTE: This medicine is only for you. Do not share this medicine with others. What if I miss a dose? This does not apply. What may interact with this medication?   chemotherapy or radiation therapy medicines that lower your immune system like etanercept, anakinra, infliximab, and adalimumab medicines that treat or prevent blood clots like warfarin phenytoin steroid medicines like prednisone or cortisone theophylline vaccines This list may not describe all possible interactions. Give your health care provider a list of all the medicines, herbs, non-prescription drugs, or dietary supplements you use. Also tell them if you smoke, drink alcohol, or use illegal drugs. Some items may interact with your medicine. What should I watch for while using this medication? Report any side effects that do not go away within 3 days to your doctor or  health care professional. Call your health care provider if any unusual symptoms occur within 6 weeks of receiving this vaccine. You may still catch the flu, but the illness is not usually as bad. You cannot get the flu from the vaccine. The vaccine will not protect against colds or other illnesses that may cause fever. The vaccine is needed every year. What side effects may I notice from receiving this medication? Side effects that you should report to your doctor or health care professional as soon as possible: allergic reactions like skin rash, itching or hives, swelling of the face, lips, or tongue Side effects that usually do not require medical attention (report to your doctor or health care professional if they continue or are bothersome): fever headache muscle aches and pains pain, tenderness, redness, or swelling at the injection site tiredness This list may not describe all possible side effects. Call your doctor for medical advice about side effects. You may report side effects to FDA at 1-800-FDA-1088. Where should I keep my medication? The vaccine will be given by a health care professional in a clinic, pharmacy, doctor's office, or other health care setting. You will not be given vaccine doses to store at home. NOTE: This sheet is a summary. It may not cover all possible information. If you have questions about this medicine, talk to your doctor, pharmacist, or health care provider.  2023 Elsevier/Gold Standard (2020-12-04 00:00:00) 

## 2022-03-03 ENCOUNTER — Other Ambulatory Visit (HOSPITAL_COMMUNITY): Payer: Self-pay

## 2022-03-10 ENCOUNTER — Ambulatory Visit
Admission: RE | Admit: 2022-03-10 | Discharge: 2022-03-10 | Disposition: A | Discharge status: Home / Self Care | Payer: Medicaid Other | Source: Ambulatory Visit | Attending: Neurosurgery | Admitting: Neurosurgery

## 2022-03-10 DIAGNOSIS — M5136 Other intervertebral disc degeneration, lumbar region: Secondary | ICD-10-CM

## 2022-03-10 MED ORDER — GADOPICLENOL 0.5 MMOL/ML IV SOLN
10.0000 mL | Freq: Once | INTRAVENOUS | Status: AC | PRN
  Administered 2022-03-10: 10 mL via INTRAVENOUS

## 2022-03-11 ENCOUNTER — Telehealth: Payer: Self-pay | Admitting: Oncology

## 2022-03-11 NOTE — Telephone Encounter (Signed)
Attempted to contact patient or patient wife in regards to question wife called in about. Question was about if they needed to keep OV on 10/31, per Dr, Gearldine Shown note on 10/16 he wanted patient to have office visit for 10/31.  No answer so voicemail was left with information

## 2022-03-13 ENCOUNTER — Other Ambulatory Visit: Payer: Self-pay | Admitting: Oncology

## 2022-03-14 ENCOUNTER — Ambulatory Visit: Payer: Medicaid Other | Attending: Cardiovascular Disease | Admitting: Physician Assistant

## 2022-03-14 ENCOUNTER — Encounter: Payer: Self-pay | Admitting: Physician Assistant

## 2022-03-14 VITALS — BP 138/72 | HR 81 | Ht 70.0 in | Wt 224.8 lb

## 2022-03-14 DIAGNOSIS — I10 Essential (primary) hypertension: Secondary | ICD-10-CM

## 2022-03-14 DIAGNOSIS — E119 Type 2 diabetes mellitus without complications: Secondary | ICD-10-CM

## 2022-03-14 DIAGNOSIS — E785 Hyperlipidemia, unspecified: Secondary | ICD-10-CM | POA: Diagnosis not present

## 2022-03-14 DIAGNOSIS — I739 Peripheral vascular disease, unspecified: Secondary | ICD-10-CM | POA: Diagnosis not present

## 2022-03-14 DIAGNOSIS — C787 Secondary malignant neoplasm of liver and intrahepatic bile duct: Secondary | ICD-10-CM

## 2022-03-14 DIAGNOSIS — C189 Malignant neoplasm of colon, unspecified: Secondary | ICD-10-CM

## 2022-03-14 MED ORDER — AMLODIPINE BESYLATE 10 MG PO TABS: 10.0000 mg | ORAL_TABLET | Freq: Every day | ORAL | 3 refills | Status: DC

## 2022-03-14 MED ORDER — ATORVASTATIN CALCIUM 80 MG PO TABS: 80.0000 mg | ORAL_TABLET | Freq: Every day | ORAL | 3 refills | Status: DC

## 2022-03-14 MED ORDER — METOPROLOL TARTRATE 50 MG PO TABS: 75.0000 mg | ORAL_TABLET | Freq: Two times a day (BID) | ORAL | 3 refills | Status: DC

## 2022-03-14 NOTE — Patient Instructions (Signed)
Medication Instructions:  INCREASE Metoprolol Tartrate (Lopressor) to 75 mg (1.5 tablets) 2 times a day  *If you need a refill on your cardiac medications before your next appointment, please call your pharmacy*  Lab Work: NONE ordered at this time of appointment   If you have labs (blood work) drawn today and your tests are completely normal, you will receive your results only by: [place] (if you have MyChart) OR A paper copy in the mail If you have any lab test that is abnormal or we need to change your treatment, we will call you to review the results.  Testing/Procedures: NONE ordered at this time of appointment   Follow-Up: At [facility], you and your health needs are our priority.  As part of our continuing mission to provide you with exceptional heart care, we have created designated Provider Care Teams.  These Care Teams include your primary Cardiologist (physician) and Advanced Practice Providers (APPs -  Physician Assistants and Nurse Practitioners) who all work together to provide you with the care you need, when you need it.   Your next appointment:   1 year(s)  The format for your next appointment:   In Person  Provider:   Kathlyn [place], MD     Other Instructions  Important Information About Sugar

## 2022-03-14 NOTE — Progress Notes (Unsigned)
Cardiology Office Note:    Date:  03/16/2022   ID:  KHADIR ROAM, DOB January 25, 1966, MRN [id number]  PCP:  Clancy Gourd, NP   [place] [facility] Cardiologist:  Kathlyn Sacramento, MD     Referring MD: Clancy Gourd, NP   Chief Complaint  Patient presents with   Follow-up    Seen for Dr. Fletcher Anon    History of Present Illness:    KAYMON DENOMME is a 56 y.o. male with a hx of PAD, depression, HTN, HLD, IDDM and hypothyroidism (Grave's disease). He has a known history of NSTEMI in January 2014 due to probable vasospasm. Cardiac catheterization in January 2014 showed no significant coronary artery disease. He has a known short occlusion of left popliteal artery. Attempted angioplasty of the left popliteal artery in January 2016 was not successful due to inability to cross the occlusion. On 09/11/2015, he had above knee to below knee bypass graft placed by Dr. Trula Slade. His claudication symptoms subsequently resolved. Last echocardiogram obtained on 01/05/2015 showed EF 60-65%, no regional wall motion abnormality. Unfortunately he was diagnosed with metastatic colon cancer. Originally, he was noted to have a partially obstructing mass noted on colonoscopy on 12/08/2015. Biopsy confirmed adenocarcinoma. CT scan of chest, abdomen and pelvis obtained 3 days later showed indeterminate tiny pulmonary nodule, multiple liver metastasis and a small nodules in sigmoid mesocolon. He underwent laparoscopic sigmoid colectomy and subsequent liver biopsy which confirmed metastatic colorectal adenocarcinoma. Port-A-Cath was placed on 01/14/2016.  He has also been treated for seizures with Keppra.  His mother passed away from [place].  He had COVID himself in 2021, however did not require hospitalization.  Last ABI obtained in January 2022 showed a left ABI of 1.39, right ABI 1.2.  Vascular Doppler showed patent left lower extremity artery from common femoral artery through tibial peroneal trunk with  widely patent jump graft from above-knee popliteal to TP trunk.  Repeat study in February 2023 is largely unchanged.  Since the last visit, patient has continued to follow-up with oncology service and is currently receiving chemotherapy.  His last follow-up with Dr. Betsy Coder was on 02/28/2022.  Patient presents today for follow-up.  He is only complaint is on the medial side of his left knee down to the ankle he has some numbness.  However the symptom occurs both at rest and with exertion.  I suspect it is more related to neuropathy rather than peripheral vascular issue.  He denies any chest pain or shortness of breath.  He is due for lower extremity ABI and LEA around February of next year.  Otherwise, he can follow-up in 1 year with Dr. Fletcher Anon.  Past Medical History:  Diagnosis Date   Allergic rhinitis    Anxiety    Arthritis    Chemotherapy-induced thrombocytopenia    Chronic nausea    mild intermittant and loose stools due to colon cancer   Chronic neck and back pain    Depression    ED (erectile dysfunction)    Generalized idiopathic epilepsy and epileptic syndromes, not intractable, without status epilepticus ([place]) 10/25/2019   (11-10-2020 per pt wife last seizure 11/ 2021)  neurologist-- dr Delice Lesch---  seizure activity/ DKA ;  EEG w/ evidence epileto genicity arising right frontocentral region;  taking keppra   GERD (gastroesophageal reflux disease)    Hiatal hernia    History of 2019 novel coronavirus disease (COVID-19) 12/13/2018   per pt wife , pt had moderate symtptoms that resolved  History of Graves' disease    s/p RAI 02/ 2012;  previous seen by endocrinologist--- dr Loanne Drilling   History of kidney stones    History of non-ST elevation myocardial infarction (NSTEMI)    Jan 2014--  no CAD;  per notes probable coronary vasospasm   History of panic attacks    History of thyroid storm    Nov 2011 w/ grave's ,  s/p RAI 02/ 2012   Hyperlipidemia    Hypertension     Hypothyroidism following radioiodine therapy    RAI in 02/  2012---  previously followed by dr Loanne Drilling,  now followed by pcp   Lower urinary tract symptoms (LUTS)    Metastatic colon cancer to liver ([place]) 11/2015   oncologist-- [facility]/ and seen by dr a. [name];  01-14-2016 s/p sigmoid colectomy, liver bx, node dissection, PAC;   chemo started 09/ 2017  ;  04/ 2021 metastatic pulmonary nodules;   active treatment every 2 wks   Nephrolithiasis    left side non-obstructive per imaging 04/ 2022   PAD (peripheral artery disease) [facility]) cardiologist-  dr Fletcher Anon   cardiologist ----  dr Jerilynn Mages. Fletcher Anon ;   left popliteal occlusion behind knee w/ collaterals;  s/p attempted angioplasty 01/ 2016;  09-11-2015 s/p pop-TPT w/ ipsNR GSV   PONV (postoperative nausea and vomiting)    Rash    due to chemo   Type 2 diabetes mellitus treated with insulin ([place])    followed by pcp----   (11-10-2020  per pt wife blood sugar check's 2-4 times daily,  fasting sugar--- 130--168)   Unspecified fracture of left foot, initial encounter for closed fracture 10/17/2020   midfoot    Past Surgical History:  Procedure Laterality Date   ABDOMINAL AORTAGRAM N/A 02/19/2014   Procedure: ABDOMINAL Maxcine Ham;  Surgeon: Wellington Hampshire, MD;  Location: [facility] CATH LAB;  Service: Cardiovascular;  Laterality: N/A;   ANTERIOR CERVICAL DECOMP/DISCECTOMY FUSION  09/08/2010   C3 -4   CYSTOSCOPY W/ URETERAL STENT PLACEMENT Right 01/04/2015   Procedure: CYSTOSCOPY WITH RETROGRADE PYELOGRAM/URETERAL STENT PLACEMENT;  Surgeon: Franchot Gallo, MD;  Location: [facility];  Service: Urology;  Laterality: Right;   CYSTOSCOPY W/ URETERAL STENT REMOVAL Right 01/26/2015   Procedure: CYSTOSCOPY WITH STENT REMOVAL;  Surgeon: Franchot Gallo, MD;  Location: [facility];  Service: Urology;  Laterality: Right;   CYSTOSCOPY/URETEROSCOPY/HOLMIUM LASER/STENT PLACEMENT Right 01/26/2015   Procedure: CYSTOSCOPY/URETEROSCOPY RIGHT;  Surgeon: Franchot Gallo, MD;  Location: [facility];  Service: Urology;  Laterality: Right;   FEMORAL-POPLITEAL BYPASS GRAFT Left 09/11/2015   Procedure: BYPASS GRAFT LEFT ABOVE KNEE TO BELOW KNEE POPLITEAL ARTERY WITH LEFT GREATER SAPHENOUS VEIN;  Surgeon: Serafina Mitchell, MD;  Location: [place];  Service: Vascular;  Laterality: Left;   [place] N/A 01/19/2016   Procedure: HERNIA REPAIR INCISIONAL;  Surgeon: Leighton Ruff, MD;  Location: [facility] ORS;  Service: General;  Laterality: N/A;   LAMINECTOMY AND MICRODISCECTOMY LUMBAR SPINE  03/26/2009   left L5 -- S1   LAPAROSCOPIC SIGMOID COLECTOMY N/A 01/14/2016   Procedure: LAPAROSCOPIC SIGMOID COLECTOMY;  Surgeon: Leighton Ruff, MD;  Location: [facility];  Service: General;  Laterality: N/A;   LEFT HEART CATHETERIZATION WITH CORONARY ANGIOGRAM N/A 06/12/2012   Procedure: LEFT HEART CATHETERIZATION WITH CORONARY ANGIOGRAM;  Surgeon: Josue Hector, MD;  Location[facility] CATH LAB;  Service: Cardiovascular;  Laterality: N/A;   No sig. CAD/  normal LVF, ef 55-65%   LIVER BIOPSY N/A 01/14/2016   Procedure:  LIVER BIOPSY;  Surgeon: Leighton Ruff, MD;  Location: [facility];  Service: General;  Laterality: N/A;   LOWER EXTREMITY ANGIOGRAM Left 05/28/2014   Procedure: LOWER EXTREMITY ANGIOGRAM;  Surgeon: Wellington Hampshire, MD;  Location: [place] CATH LAB;  Service: Cardiovascular;  Laterality: Left;  Failed PTA CTO   OPEN REDUCTION INTERNAL FIXATION (ORIF) FOOT LISFRANC FRACTURE Left 11/11/2020   Procedure: OPEN REDUCTION INTERNAL FIXATION (ORIF) FOOT LISFRANC FRACTURE;  Surgeon: Evelina Bucy, DPM;  Location: [place];  Service: Podiatry;  Laterality: Left;  General with Regional Block   POPLITEAL ARTERY ANGIOPLASTY Left 05/28/2014    dr Fletcher Anon   Attempted and unsuccessful due to inability to cross the occlusionnotes    PORTACATH PLACEMENT Right 01/14/2016   Procedure: INSERTION PORT-A-CATH;  Surgeon: Leighton Ruff, MD;  Location: [facility];   Service: General;  Laterality: Right;   POSTERIOR CERVICAL FUSION/FORAMINOTOMY N/A 12/10/2012   Procedure: POSTERIOR CERVICAL FUSION/FORAMINOTOMY LEVEL 1;  Surgeon: Ophelia Charter, MD;  Location: [place] NEURO ORS;  Service: Neurosurgery;  Laterality: N/A;  Cervical three-four posterior cervical fusion with lateral mass screws   POSTERIOR LUMBAR FUSION  11-11-2009;   07-15-2013   L5 -- S1;   L4-- S1   SHOULDER ARTHROSCOPY Right 03/08/2004   debridement labral tear/  DCR/  acromioplasty   UMBILICAL HERNIA REPAIR  1980   VEIN HARVEST Left 09/11/2015   Procedure: LEFT GREATER SAPHENOUS VEIN HARVEST;  Surgeon: Serafina Mitchell, MD;  Location: [facility] OR;  Service: Vascular;  Laterality: Left;    Current Medications: Current Meds  Medication Sig   ACCU-CHEK GUIDE test strip CHECK BLOOD SUGAR THREE TIMES DAILY AS DIRECTED   ACCU-CHEK SOFTCLIX LANCETS lancets USE  TID TO CHECK BLOOD SUGAR LEVELS   aspirin EC 81 MG tablet Take 81 mg by mouth daily.   dapagliflozin propanediol (FARXIGA) 10 MG TABS tablet Take 10 mg by mouth daily.   diphenoxylate-atropine (LOMOTIL) 2.5-0.025 MG tablet Take 2 tablets by mouth 4 (four) times daily as needed for diarrhea or loose stools.   docusate sodium (COLACE) 100 MG capsule Take 100 mg by mouth 2 (two) times daily.   GLOBAL EASE INJECT PEN NEEDLES 31G X 5 MM MISC Inject 1 Syringe into the skin 4 (four) times daily.   insulin aspart (NOVOLOG) 100 UNIT/ML injection Inject 12 Units into the skin 3 (three) times daily before meals. (Patient taking differently: Inject 18 Units into the skin 3 (three) times daily before meals.)   Insulin Degludec (TRESIBA FLEXTOUCH [place]) Inject 80 Units into the skin daily.   levETIRAcetam (KEPPRA) 750 MG tablet Take 1 tablet twice a day   levothyroxine (SYNTHROID) 150 MCG tablet Take 150 mcg by mouth daily.   lidocaine-prilocaine (EMLA) cream Apply 1 Application topically as needed. Apply to portacath site 1 hour prior to use   LORazepam  (ATIVAN) 0.5 MG tablet Take 1 tablet (0.5 mg total) by mouth every 8 (eight) hours as needed for anxiety.   meclizine (ANTIVERT) 25 MG tablet Take 25 mg by mouth 3 (three) times daily as needed for dizziness.   metFORMIN (GLUCOPHAGE-XR) 500 MG 24 hr tablet Take 1,000 mg by mouth in the morning and at bedtime.   metoprolol tartrate (LOPRESSOR) 50 MG tablet Take 1.5 tablets (75 mg total) by mouth 2 (two) times daily.   mirtazapine (REMERON) 15 MG tablet Take 15 mg by mouth at bedtime.   nitroGLYCERIN (NITROSTAT) 0.4 MG SL tablet DISSOLVE 1 TABLET UNDER TONGUE EVERY 5 MINUTES AS  NEEDED FOR CHEST PAIN. IF NO RELIEF AFTER 3 DOSES CALL 911. (Patient not taking: Reported on 03/15/2022)   ondansetron (ZOFRAN) 8 MG tablet Take 1 tablet (8 mg total) by mouth every 8 (eight) hours as needed for nausea or vomiting. Begin 72 hours after chemo treatment   oxyCODONE ER (XTAMPZA ER) 13.5 MG C12A Take by mouth.   oxyCODONE-acetaminophen (PERCOCET) 10-325 MG tablet Take 1 tablet by mouth every 6 (six) hours as needed.   pantoprazole (PROTONIX) 40 MG tablet Take 40 mg by mouth 2 (two) times daily.   potassium chloride SA (KLOR-CON M) 20 MEQ tablet Take 1 tablet (20 mEq total) by mouth daily.   promethazine (PHENERGAN) 12.5 MG tablet Take 1 tablet (12.5 mg total) by mouth every 6 (six) hours as needed.   sildenafil (VIAGRA) 100 MG tablet Take 100 mg by mouth daily as needed.   tamsulosin (FLOMAX) 0.4 MG CAPS capsule Take 0.4 mg by mouth 2 (two) times daily.   tiZANidine (ZANAFLEX) 4 MG tablet Take 4 mg by mouth 3 (three) times daily.   trifluridine-tipiracil (LONSURF) 20-8.19 MG tablet Take 3 tablets (60 mg of trifluridine total) by mouth 2 (two) times daily after a meal. Take 3 tablets (60 mg of trifluridine total) by mouth 2 (two) times daily after a meal. Start cycle on 02/28/22 after MD visit   venlafaxine XR (EFFEXOR-XR) 75 MG 24 hr capsule Take 225 mg by mouth daily.   Vitamin D, Ergocalciferol, (DRISDOL) 1.25 MG  (50000 UNIT) CAPS capsule Take 50,000 Units by mouth once a week.   [DISCONTINUED] amLODipine (NORVASC) 10 MG tablet Take 10 mg by mouth daily.   [DISCONTINUED] atorvastatin (LIPITOR) 80 MG tablet TAKE 1 TABLET(80 MG) BY MOUTH AT BEDTIME   [DISCONTINUED] metoprolol tartrate (LOPRESSOR) 50 MG tablet TAKE 1 TABLET(50 MG) BY MOUTH TWICE DAILY     Allergies:   Hydrocodone   Social History   Socioeconomic History   Marital status: Married    Spouse name: Not on file   Number of children: 5   Years of education: Not on file   Highest education level: Not on file  Occupational History   Occupation: Disabled    Employer: UNEMPLOYED  Tobacco Use   Smoking status: Never   Smokeless tobacco: Never  Vaping Use   Vaping Use: Never used  Substance and Sexual Activity   Alcohol use: Yes    Comment: RARE   Drug use: Never   Sexual activity: Not on file  Other Topics Concern   Not on file  Social History Narrative   Married - wife works Surveyor, quantity unit @ [employer]   5 daughters   Regular exercise-no   Disabled to due back problem   12/04/2015   Right handed    Social Determinants of Health   Financial Resource Strain: Not on file  Food Insecurity: Not on file  Transportation Needs: Not on file  Physical Activity: Not on file  Stress: Not on file  Social Connections: Not on file     Family History: The patient's family history includes Diabetes in his maternal grandmother; Edema in his father; Heart Problems in his father; Heart attack in his maternal grandfather; Heart disease in his maternal grandmother; Hypertension in his maternal grandmother; Lung cancer in his maternal grandmother; Thyroid disease in his mother.  ROS:   Please see the history of present illness.     All other systems reviewed and are negative.  EKGs/Labs/Other Studies Reviewed:    The following  studies were reviewed today:  Echo 01/05/2015 LV EF: 60% -   65%  Study Conclusions   - Left ventricle: The  cavity size was normal. Wall thickness was    increased in a pattern of mild LVH. Systolic function was normal.    The estimated ejection fraction was in the range of 60% to 65%.    Wall motion was normal; there were no regional wall motion    abnormalities. Left ventricular diastolic function parameters    were normal.   EKG:  EKG is  ordered today.  The ekg ordered today demonstrates normal sinus rhythm, no significant ST-T wave changes  Recent Labs: 07/06/2021: Magnesium 1.8 03/15/2022: ALT 45; BUN 8; Creatinine 0.95; Hemoglobin 10.7; Platelet Count 70; Potassium 3.8; Sodium 139  Recent Lipid Panel    Component Value Date/Time   CHOL 139 04/16/2019 0842   TRIG 210 (H) 04/16/2019 0842   HDL 36 (L) 04/16/2019 0842   CHOLHDL 3.9 04/16/2019 0842   CHOLHDL 2.5 06/12/2012 0637   VLDL 19 06/12/2012 0637   LDLCALC 68 04/16/2019 0842   LDLDIRECT 43 06/11/2012 2246     Risk Assessment/Calculations:           Physical Exam:    VS:  BP 138/72   Pulse 81   Ht '5\' 10"'$  (1.778 m)   Wt 224 lb 12.8 oz (102 kg)   BMI 32.26 kg/m         Wt Readings from Last 3 Encounters:  03/15/22 224 lb 12.8 oz (102 kg)  03/14/22 224 lb 12.8 oz (102 kg)  02/28/22 223 lb 6.4 oz (101.3 kg)     GEN:  Well nourished, well developed in no acute distress HEENT: Normal NECK: No JVD; No carotid bruits LYMPHATICS: No lymphadenopathy CARDIAC: RRR, no murmurs, rubs, gallops RESPIRATORY:  Clear to auscultation without rales, wheezing or rhonchi  ABDOMEN: Soft, non-tender, non-distended MUSCULOSKELETAL:  No edema; No deformity  SKIN: Warm and dry NEUROLOGIC:  Alert and oriented x 3 PSYCHIATRIC:  Normal affect   ASSESSMENT:    1. PAD (peripheral artery disease) ([place])   2. Essential hypertension, benign   3. Hyperlipidemia LDL goal <70   4. Controlled type 2 diabetes mellitus without complication, without long-term current use of insulin ([place])   5. Metastatic colon cancer to liver [facility])    PLAN:     In order of problems listed above:  PAD: Patient has a history of PAD.  Attempted angioplasty of the left popliteal artery in January 2016 was unsuccessful due to inability to cross the occlusion.  Patient was seen by vascular surgery and underwent above-knee to below-knee bypass surgery in April 2017.  Since then, he has been getting yearly ABI and LEA.  Last ABI was obtained in February 2023 which showed normal ABI bilaterally.  He denies any claudication symptoms.  Due for repeat ABI and LEA in February 2024  Hypertension: On aspirin and Lipitor  Hyperlipidemia: Continue Lipitor  DM2: Managed by primary care provider.  History of metastatic colon cancer followed by Dr. Benay Spice of oncology           Medication Adjustments/Labs and Tests Ordered: Current medicines are reviewed at length with the patient today.  Concerns regarding medicines are outlined above.  Orders Placed This Encounter  Procedures   EKG 12-Lead   Meds ordered this encounter  Medications   amLODipine (NORVASC) 10 MG tablet    Sig: Take 1 tablet (10 mg total) by mouth daily.  Dispense:  90 tablet    Refill:  3   atorvastatin (LIPITOR) 80 MG tablet    Sig: Take 1 tablet (80 mg total) by mouth daily. TAKE 1 TABLET(80 MG) BY MOUTH AT BEDTIME    Dispense:  90 tablet    Refill:  3   metoprolol tartrate (LOPRESSOR) 50 MG tablet    Sig: Take 1.5 tablets (75 mg total) by mouth 2 (two) times daily.    Dispense:  270 tablet    Refill:  3    Patient Instructions  Medication Instructions:  INCREASE Metoprolol Tartrate (Lopressor) to 75 mg (1.5 tablets) 2 times a day  *If you need a refill on your cardiac medications before your next appointment, please call your pharmacy*  Lab Work: NONE ordered at this time of appointment   If you have labs (blood work) drawn today and your tests are completely normal, you will receive your results only by: [place] (if you have MyChart) OR A paper copy in the  mail If you have any lab test that is abnormal or we need to change your treatment, we will call you to review the results.  Testing/Procedures: NONE ordered at this time of appointment   Follow-Up: At [facility], you and your health needs are our priority.  As part of our continuing mission to provide you with exceptional heart care, we have created designated Provider Care Teams.  These Care Teams include your primary Cardiologist (physician) and Advanced Practice Providers (APPs -  Physician Assistants and Nurse Practitioners) who all work together to provide you with the care you need, when you need it.   Your next appointment:   1 year(s)  The format for your next appointment:   In Person  Provider:   Kathlyn Sacramento, MD     Other Instructions  Important Information About Sugar         Hilbert Corrigan, [place]  03/16/2022 9:32 PM    [place]

## 2022-03-15 ENCOUNTER — Encounter: Payer: Self-pay | Admitting: Nurse Practitioner

## 2022-03-15 ENCOUNTER — Ambulatory Visit: Payer: Medicaid Other | Admitting: Cardiovascular Disease

## 2022-03-15 ENCOUNTER — Inpatient Hospital Stay: Payer: Medicaid Other

## 2022-03-15 ENCOUNTER — Inpatient Hospital Stay: Payer: Medicaid Other | Admitting: Nurse Practitioner

## 2022-03-15 VITALS — BP 132/78

## 2022-03-15 DIAGNOSIS — C187 Malignant neoplasm of sigmoid colon: Secondary | ICD-10-CM

## 2022-03-15 DIAGNOSIS — Z95828 Presence of other vascular implants and grafts: Secondary | ICD-10-CM

## 2022-03-15 DIAGNOSIS — Z5112 Encounter for antineoplastic immunotherapy: Secondary | ICD-10-CM | POA: Diagnosis not present

## 2022-03-15 LAB — CMP (CANCER CENTER ONLY)
ALT: 45 U/L — ABNORMAL HIGH (ref 0–44)
AST: 28 U/L (ref 15–41)
Albumin: 4.2 g/dL (ref 3.5–5.0)
Alkaline Phosphatase: 95 U/L (ref 38–126)
Anion gap: 11 (ref 5–15)
BUN: 8 mg/dL (ref 6–20)
CO2: 25 mmol/L (ref 22–32)
Calcium: 9.1 mg/dL (ref 8.9–10.3)
Chloride: 103 mmol/L (ref 98–111)
Creatinine: 0.95 mg/dL (ref 0.61–1.24)
GFR, Estimated: >60 mL/min (ref >60)
Glucose, Bld: 152 mg/dL — ABNORMAL HIGH (ref 70–99)
Potassium: 3.8 mmol/L (ref 3.5–5.1)
Sodium: 139 mmol/L (ref 135–145)
Total Bilirubin: 0.6 mg/dL (ref 0.3–1.2)
Total Protein: 7.3 g/dL (ref 6.5–8.1)

## 2022-03-15 LAB — CBC WITH DIFFERENTIAL (CANCER CENTER ONLY)
Abs Immature Granulocytes: 0.01 10*3/uL (ref 0.00–0.07)
Basophils Absolute: 0 10*3/uL (ref 0.0–0.1)
Basophils Relative: 0 %
Eosinophils Absolute: 0.1 10*3/uL (ref 0.0–0.5)
Eosinophils Relative: 1 %
HCT: 32.7 % — ABNORMAL LOW (ref 39.0–52.0)
Hemoglobin: 10.7 g/dL — ABNORMAL LOW (ref 13.0–17.0)
Immature Granulocytes: 0 %
Lymphocytes Relative: 40 %
Lymphs Abs: 2.2 10*3/uL (ref 0.7–4.0)
MCH: 30.2 pg (ref 26.0–34.0)
MCHC: 32.7 g/dL (ref 30.0–36.0)
MCV: 92.4 fL (ref 80.0–100.0)
Monocytes Absolute: 0.2 10*3/uL (ref 0.1–1.0)
Monocytes Relative: 4 %
Neutro Abs: 3 10*3/uL (ref 1.7–7.7)
Neutrophils Relative %: 55 %
Platelet Count: 70 10*3/uL — ABNORMAL LOW (ref 150–400)
RBC: 3.54 MIL/uL — ABNORMAL LOW (ref 4.22–5.81)
RDW: 15.7 % — ABNORMAL HIGH (ref 11.5–15.5)
WBC Count: 5.6 10*3/uL (ref 4.0–10.5)
nRBC: 0 % (ref 0.0–0.2)

## 2022-03-15 LAB — TOTAL PROTEIN, URINE DIPSTICK: Protein, ur: NEGATIVE mg/dL

## 2022-03-15 LAB — CEA (ACCESS): CEA (CHCC): 89.35 ng/mL — ABNORMAL HIGH (ref 0.00–5.00)

## 2022-03-15 MED ORDER — SODIUM CHLORIDE 0.9 % IV SOLN: Freq: Once | INTRAVENOUS | Status: AC

## 2022-03-15 MED ORDER — ALTEPLASE 2 MG IJ SOLR
2.0000 mg | Freq: Once | INTRAMUSCULAR | Status: AC | PRN
  Administered 2022-03-15: 2 mg
  Filled 2022-03-15: qty 2

## 2022-03-15 MED ORDER — SODIUM CHLORIDE 0.9 % IV SOLN
5.0000 mg/kg | Freq: Once | INTRAVENOUS | Status: AC
  Administered 2022-03-15: 500 mg via INTRAVENOUS
  Filled 2022-03-15: qty 16

## 2022-03-15 MED ORDER — HEPARIN SOD (PORK) LOCK FLUSH 100 UNIT/ML IV SOLN
500.0000 [IU] | Freq: Once | INTRAVENOUS | Status: AC | PRN
  Administered 2022-03-15: 500 [IU]

## 2022-03-15 MED ORDER — SODIUM CHLORIDE 0.9% FLUSH
10.0000 mL | INTRAVENOUS | Status: DC | PRN
  Administered 2022-03-15: 10 mL

## 2022-03-15 NOTE — Patient Instructions (Signed)

## 2022-03-15 NOTE — Progress Notes (Signed)
Patient seen by Ned Card NP today  Vitals are within treatment parameters.  Labs reviewed by Ned Card NP and are not all within treatment parameters. Platelets are 70 k/uL today. Per Ned Card, NP, ok to treat.   Per physician team, patient is ready for treatment and there are NO modifications to the treatment plan.

## 2022-03-15 NOTE — Progress Notes (Signed)
[place] OFFICE PROGRESS NOTE   Diagnosis: Colon cancer  INTERVAL HISTORY:   Jeff [name] returns as scheduled.  He completed cycle 10 Lonsurf beginning 02/28/2022.  He continues every 2-week bevacizumab.  He had mild nausea with Lonsurf.  No mouth sores.  No diarrhea.  No rash.  He denies bleeding.  No cough.  No shortness of breath.  No seizure activity.  He has a good appetite.  Blood sugars overall "good".  Objective:  Vital signs in last 24 hours:  Blood pressure 134/80, pulse 97, temperature 98.4 F (36.9 C), temperature source Oral, resp. rate 18, height _0  (1.778 m), weight 224 lb 12.8 oz (102 kg), SpO2 98 %.    HEENT: No thrush or ulcers. Resp: Lungs clear bilaterally. Cardio: Regular rate and rhythm. GI: Abdomen soft and nontender.  No hepatosplenomegaly. Vascular: No leg edema. Neuro: Alert and oriented. Skin: No rash. Port-A-Cath without erythema.   Lab Results:  Lab Results  Component Value Date   WBC 5.6 03/15/2022   HGB 10.7 (L) 03/15/2022   HCT 32.7 (L) 03/15/2022   MCV 92.4 03/15/2022   PLT 70 (L) 03/15/2022   NEUTROABS 3.0 03/15/2022    Imaging:  No results found.  Medications: I have reviewed the patient's current medications.  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, biopsy confirmed adenocarcinoma         CTs chest, abdomen, and pelvis on 12/11/2015-indeterminate tiny pulmonary nodules, multiple liver metastases, small nodes in the sigmoid mesocolon Laparoscopic sigmoid colectomy, liver biopsy, Port-A-Cath placement 01/14/2016 Pathology sigmoid colon resection- colonic adenocarcinoma, 5.3 cm extending into pericolonic connective tissue, positive lymph-vascular invasion, positive perineural invasion, negative margins, metastatic carcinoma in 9 of 28 lymph nodes Pathology liver biopsy-metastatic colorectal adenocarcinoma MSI stable; mismatch repair protein  normal APC alteration, K RAS wild-type, no BRAF mutation Cycle 1 FOLFOX 02/02/2016 Cycle 2 FOLFOX 02/15/2016 Cycle 3 FOLFOX 02/29/2016 Cycle 4 FOLFOX 03/14/2016 Cycle 5 FOLFOX 03/28/2016 Cycle 6 FOLFOX 04/11/2016 (oxaliplatin held secondary to thrombocytopenia) 04/21/2016 restaging CTs-difficulty evaluating liver lesions due to hepatic steatosis. Stable right adrenal nodule. No adenopathy or local recurrence near the rectosigmoid anastomotic site. Cycle 7 FOLFOX 04/25/2016 MRI liver 05/02/2016-partial improvement in hepatic metastases Cycle 8 FOLFOX 05/10/2016 Cycle 9 FOLFOX 05/23/2016 (oxaliplatin held due to thrombocytopenia) Cycle 10 FOLFOX 06/06/2016 (oxaliplatin dose reduced due to thrombocytopenia) Cycle 11 FOLFOX 06/27/2016 (oxaliplatin held due to neuropathy) Cycle 12 FOLFOX 07/11/2016 (oxaliplatin held) Initiation of maintenance Xeloda 7 days on/7 days off 07/27/2016 MRI liver 11/18/2016-decrease in hepatic metastatic disease. No new or progressive disease identified within the abdomen. Continuation of Xeloda 7 days on/7 days off MRI liver 04/27/2017-previous liver lesions not identified, no new lesions, no lymphadenopathy Xeloda continued 7 days on/7 days off MRI liver 12/04/2017 - no evidence of metastatic disease, hepatic steatosis Xeloda continued 7 days on/7 days off MRI liver 07/15/2018- no evidence of metastatic disease.  Stable severe hepatic steatosis. Xeloda continued 7 days on/7 days off MRI liver 03/16/2019-hepatic steatosis, no liver mass, focal area of intrahepatic biliary dilatation in segments 2 and 3 of the left lobe-increased Xeloda continued 7 days on/7 days off MRI abdomen 08/19/2019-no findings to suggest liver metastases.  Bilateral lung nodules measuring up to 2.8 cm, progressive and more conspicuous than on previous exam CT chest 08/29/2019-multiple pulmonary metastases, new from 04/21/2016 Cycle 1 FOLFIRI/bevacizumab 09/09/2019 Cycle 2 FOLFIRI/bevacizumab  09/26/2019  Cycle 3 FOLFIRI/bevacizumab 10/10/2019 Cycle 4 FOLFIRI/bevacizumab 10/24/2019 Cycle 5 FOLFIRI/bevacizumab 11/07/2019 CT  chest 11/14/2019-decreased size of lung nodules, no new lesions, hepatic steatosis Cycle 6 FOLFIRI/bevacizumab 11/21/2019 Cycle 7 FOLFIRI/bevacizumab 12/05/2019 Cycle 8 FOLFIRI/bevacizumab 12/19/2019 Cycle 9 FOLFIRI/bevacizumab 01/02/2020 Cycle 10 FOLFIRI/bevacizumab 01/16/2020 CT chest 01/29/2020-stable bilateral pulmonary metastases.  No new or progressive metastatic disease in the chest. Cycle 11 FOLFIRI/bevacizumab 01/30/2020 Cycle 12 FOLFIRI/bevacizumab 02/19/2020 Cycle 13 FOLFIRI/bevacizumab 03/12/2020 Cycle 14 FOLFIRI/bevacizumab 04/02/2020 Cycle 15 FOLFIRI/bevacizumab 04/23/2020 Cycle 16 FOLFIRI/bevacizumab 05/21/2020 CT chest 06/09/2020-mild progression pulmonary metastasis.  Some lesions have increased in size while others are similar. Cycle 1 irinotecan/Panitumumab 06/18/2020 Cycle 2 irinotecan/Panitumumab 07/02/2020 Cycle 3 irinotecan/panitumumab 07/16/2020 Cycle 4 irinotecan/Panitumumab 07/30/2020 Cycle 5 irinotecan/Panitumumab 08/13/2020, Emend added Cycle 6 irinotecan/Panitumumab 08/27/2020 CT chest 09/08/2020-decreased size of pulmonary nodules, no progressive disease Cycle 7 irinotecan/panitumumab 09/02/2020 Cycle 8 irinotecan/panitumumab 09/24/2020 Cycle 9 irinotecan/Panitumumab 10/08/2020 10/22/2020 treatment held due to left foot fracture, need for surgery Cycle 10 irinotecan/panitumumab 11/24/2020 Cycle 11 irinotecan/Panitumumab 12/08/2020 Cycle 12 irinotecan/panitumumab 12/22/2020 Cycle 13 irinotecan/panitumumab 01/05/2021 01/20/2021 CT chest-mixed response with minimal increase in size of some lesions and minimal decrease in the size of other lesions.  Overall number of lesions is unchanged. Cycle 14 irinotecan/Panitumumab 02/05/2021 Cycle 15 irinotecan/Panitumumab 02/23/2021 Cycle 16 irinotecan/panitumumab 03/16/2021 Cycle 17 irinotecan/Panitumumab  04/13/2021 05/03/2021-CT chest-enlargement of pulmonary metastases, no new lesions 06/21/2021-cycle 1 Lonsurf/bevacizumab 07/19/2021-cycle 2 Lonsurf/bevacizumab 08/16/2021-cycle 3 Lonsurf/bevacizumab 09/09/2021 CT chest-bilateral lung nodules and masses with mixed response, overall stable to very minimally increased 09/13/2021 cycle 4 Lonsurf/bevacizumab 10/12/2021 cycle 5 Lonsurf/bevacizumab 10/21/2021 chest CT-bilateral pulmonary metastases without significant change.  No new or progressive disease within the chest. 11/08/2021 cycle 6 Lonsurf/bevacizumab 12/06/2021 cycle 7 Lonsurf/bevacizumab 01/03/2022 cycle 8 Lonsurf/bevacizumab CTs 01/25/2022-index lung lesion stable to mildly increased in size.  Mild retroperitoneal adenopathy, mildly increased in size compared to 12/16/2020. 02/01/2022 cycle 9 Lonsurf/bevacizumab 02/28/2022 cycle 10 Lonsurf/bevacizumab   2.   Rectal bleeding and constipation secondary to #1   3.   History of peripheral vascular disease, status post left lower extremity vascular bypass surgery in April 2017   4.   History of nephrolithiasis   5.   History of Graves' disease treated with radioactive iodine   6.   Anxiety/depression   7.   Hypertension   8.   Hospitalization 01/19/2016 with wound dehiscence status post secondary suture closure of abdominal wall   9.   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 Hyperglycemia 06/20/2016-diagnosed with diabetes, maintained on insulin   11.  Positive COVID test 12/13/2018; positive COVID test 01/25/2021   12. Hospitalized with seizure activity/DKA. Now on Keppra, insulin adjusted. 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  Left foot fracture-surgical repair 11/11/2020     Disposition: Mr. Dancy appears stable.  There is no clinical evidence of disease progression.  Plan to continue Lonsurf/bevacizumab.  CBC and chemistry panel reviewed.  Labs adequate to proceed with bevacizumab today as scheduled.  He has moderate thrombocytopenia.  He will contact the office with bleeding.  He will return for follow-up in 2 weeks.  We are available to see him sooner if needed.    Ned Card ANP/GNP-BC   03/15/2022  8:59 AM

## 2022-03-15 NOTE — Patient Instructions (Signed)
Jeff [name]   Discharge Instructions: Thank you for choosing [facility] to provide your oncology and hematology care.   If you have a lab appointment with the [facility], please go directly to the [facility] and check in at the registration area.   Wear comfortable clothing and clothing appropriate for easy access to any Portacath or PICC line.   We strive to give you quality time with your provider. You may need to reschedule your appointment if you arrive late (15 or more minutes).  Arriving late affects you and other patients whose appointments are after yours.  Also, if you miss three or more appointments without notifying the office, you may be dismissed from the clinic at the provider's discretion.      For prescription refill requests, have your pharmacy contact our office and allow 72 hours for refills to be completed.    Today you received the following chemotherapy and/or immunotherapy agents Bevacizumab       To help prevent nausea and vomiting after your treatment, we encourage you to take your nausea medication as directed.  BELOW ARE SYMPTOMS THAT SHOULD BE REPORTED IMMEDIATELY: *FEVER GREATER THAN 100.4 F (38 C) OR HIGHER *CHILLS OR SWEATING *NAUSEA AND VOMITING THAT IS NOT CONTROLLED WITH YOUR NAUSEA MEDICATION *UNUSUAL SHORTNESS OF BREATH *UNUSUAL BRUISING OR BLEEDING *URINARY PROBLEMS (pain or burning when urinating, or frequent urination) *BOWEL PROBLEMS (unusual diarrhea, constipation, pain near the anus) TENDERNESS IN MOUTH AND THROAT WITH OR WITHOUT PRESENCE OF ULCERS (sore throat, sores in mouth, or a toothache) UNUSUAL RASH, SWELLING OR PAIN  UNUSUAL VAGINAL DISCHARGE OR ITCHING   Items with * indicate a potential emergency and should be followed up as soon as possible or go to the Emergency Department if any problems should occur.  Please show the CHEMOTHERAPY ALERT CARD or IMMUNOTHERAPY ALERT CARD at check-in  to the Emergency Department and triage nurse.  Should you have questions after your visit or need to cancel or reschedule your appointment, please contact [facility]  Dept: [phone]  and follow the prompts.  Office hours are 8:00 a.m. to 4:30 p.m. Monday - Friday. Please note that voicemails left after 4:00 p.m. may not be returned until the following business day.  We are closed weekends and major holidays. You have access to a nurse at all times for urgent questions. Please call the main number to the clinic Dept: [phone] and follow the prompts.   For any non-urgent questions, you may also contact your provider using MyChart. We now offer e-Visits for anyone 78 and older to request care online for non-urgent symptoms. For details visit [email or web address].   Also download the MyChart app! Go to the app store, search "MyChart", open the app, select [facility], and log in with your MyChart username and password.  Masks are optional in the cancer centers. If you would like for your care team to wear a mask while they are taking care of you, please let them know. You may have one support person who is at least 55 years old accompany you for your appointments.  Bevacizumab Injection What is this medication? BEVACIZUMAB (be va SIZ yoo mab) treats some types of cancer. It works by blocking a protein that causes cancer cells to grow and multiply. This helps to slow or stop the spread of cancer cells. It is a monoclonal antibody. This medicine may be used for other purposes; ask your health care  provider or pharmacist if you have questions. COMMON BRAND NAME(S): Alymsys, Avastin, MVASI, Jeff [name] What should I tell my care team before I take this medication? They need to know if you have any of these conditions: Blood clots Coughing up blood Having or recent surgery Heart failure High blood pressure History of a connection between 2 or more body parts that do  not usually connect (fistula) History of a tear in your stomach or intestines Protein in your urine An unusual or allergic reaction to bevacizumab, other medications, foods, dyes, or preservatives Pregnant or trying to get pregnant Breast-feeding How should I use this medication? This medication is injected into a vein. It is given by your care team in a hospital or clinic setting. Talk to your care team the use of this medication in children. Special care may be needed. Overdosage: If you think you have taken too much of this medicine contact a poison control center or emergency room at once. NOTE: This medicine is only for you. Do not share this medicine with others. What if I miss a dose? Keep appointments for follow-up doses. It is important not to miss your dose. Call your care team if you are unable to keep an appointment. What may interact with this medication? Interactions are not expected. This list may not describe all possible interactions. Give your health care provider a list of all the medicines, herbs, non-prescription drugs, or dietary supplements you use. Also tell them if you smoke, drink alcohol, or use illegal drugs. Some items may interact with your medicine. What should I watch for while using this medication? Your condition will be monitored carefully while you are receiving this medication. You may need blood work while taking this medication. This medication may make you feel generally unwell. This is not uncommon as chemotherapy can affect healthy cells as well as cancer cells. Report any side effects. Continue your course of treatment even though you feel ill unless your care team tells you to stop. This medication may increase your risk to bruise or bleed. Call your care team if you notice any unusual bleeding. Before having surgery, talk to your care team to make sure it is ok. This medication can increase the risk of poor healing of your surgical site or wound. You  will need to stop this medication for 28 days before surgery. After surgery, wait at least 28 days before restarting this medication. Make sure the surgical site or wound is healed enough before restarting this medication. Talk to your care team if questions. Talk to your care team if you may be pregnant. Serious birth defects can occur if you take this medication during pregnancy and for 6 months after the last dose. Contraception is recommended while taking this medication and for 6 months after the last dose. Your care team can help you find the option that works for you. Do not breastfeed while taking this medication and for 6 months after the last dose. This medication can cause infertility. Talk to your care team if you are concerned about your fertility. What side effects may I notice from receiving this medication? Side effects that you should report to your care team as soon as possible: Allergic reactions--skin rash, itching, hives, swelling of the face, lips, tongue, or throat Bleeding--bloody or black, tar-like stools, vomiting blood or brown material that looks like coffee grounds, red or dark brown urine, small red or purple spots on skin, unusual bruising or bleeding Blood clot--pain, swelling, or warmth in  the leg, shortness of breath, chest pain Heart attack--pain or tightness in the chest, shoulders, arms, or jaw, nausea, shortness of breath, cold or clammy skin, feeling faint or lightheaded Heart failure--shortness of breath, swelling of the ankles, feet, or hands, sudden weight gain, unusual weakness or fatigue Increase in blood pressure Infection--fever, chills, cough, sore throat, wounds that don't heal, pain or trouble when passing urine, general feeling of discomfort or being unwell Infusion reactions--chest pain, shortness of breath or trouble breathing, feeling faint or lightheaded Kidney injury--decrease in the amount of urine, swelling of the ankles, hands, or feet Stomach  pain that is severe, does not go away, or gets worse Stroke--sudden numbness or weakness of the face, arm, or leg, trouble speaking, confusion, trouble walking, loss of balance or coordination, dizziness, severe headache, change in vision Sudden and severe headache, confusion, change in vision, seizures, which may be signs of posterior reversible encephalopathy syndrome (PRES) Side effects that usually do not require medical attention (report to your care team if they continue or are bothersome): Back pain Change in taste Diarrhea Dry skin Increased tears Nosebleed This list may not describe all possible side effects. Call your doctor for medical advice about side effects. You may report side effects to FDA at 1-800-FDA-1088. Where should I keep my medication? This medication is given in a hospital or clinic. It will not be stored at home. NOTE: This sheet is a summary. It may not cover all possible information. If you have questions about this medicine, talk to your doctor, pharmacist, or health care provider.  2023 Elsevier/Gold Standard (2021-09-14 00:00:00)  Influenza Virus Vaccine injection What is this medication? INFLUENZA VIRUS VACCINE (in floo EN zuh VAHY ruhs vak SEEN) helps to reduce the risk of getting influenza also known as the flu. The vaccine only helps protect you against some strains of the flu. This medicine may be used for other purposes; ask your health care provider or pharmacist if you have questions. COMMON BRAND NAME(S): Afluria, Afluria Quadrivalent, Agriflu, Alfuria, FLUAD, FLUAD Quadrivalent, Fluarix, Fluarix Quadrivalent, Flublok, Flublok Quadrivalent, FLUCELVAX, FLUCELVAX Quadrivalent, Flulaval, Flulaval Quadrivalent, Fluvirin, Fluzone, Fluzone High-Dose, Fluzone Intradermal, Fluzone Quadrivalent What should I tell my care team before I take this medication? They need to know if you have any of these conditions: bleeding disorder like hemophilia fever or  infection Guillain-Barre syndrome or other neurological problems immune system problems infection with the human immunodeficiency virus (HIV) or AIDS low blood platelet counts multiple sclerosis an unusual or allergic reaction to influenza virus vaccine, latex, other medicines, foods, dyes, or preservatives. Different brands of vaccines contain different allergens. Some may contain latex or eggs. Talk to your doctor about your allergies to make sure that you get the right vaccine. pregnant or trying to get pregnant breast-feeding How should I use this medication? This vaccine is for injection into a muscle or under the skin. It is given by a health care professional. A copy of Vaccine Information Statements will be given before each vaccination. Read this sheet carefully each time. The sheet may change frequently. Talk to your healthcare provider to see which vaccines are right for you. Some vaccines should not be used in all age groups. Overdosage: If you think you have taken too much of this medicine contact a poison control center or emergency room at once. NOTE: This medicine is only for you. Do not share this medicine with others. What if I miss a dose? This does not apply. What may interact with this medication?  chemotherapy or radiation therapy medicines that lower your immune system like etanercept, anakinra, infliximab, and adalimumab medicines that treat or prevent blood clots like warfarin phenytoin steroid medicines like prednisone or cortisone theophylline vaccines This list may not describe all possible interactions. Give your health care provider a list of all the medicines, herbs, non-prescription drugs, or dietary supplements you use. Also tell them if you smoke, drink alcohol, or use illegal drugs. Some items may interact with your medicine. What should I watch for while using this medication? Report any side effects that do not go away within 3 days to your doctor or  health care professional. Call your health care provider if any unusual symptoms occur within 6 weeks of receiving this vaccine. You may still catch the flu, but the illness is not usually as bad. You cannot get the flu from the vaccine. The vaccine will not protect against colds or other illnesses that may cause fever. The vaccine is needed every year. What side effects may I notice from receiving this medication? Side effects that you should report to your doctor or health care professional as soon as possible: allergic reactions like skin rash, itching or hives, swelling of the face, lips, or tongue Side effects that usually do not require medical attention (report to your doctor or health care professional if they continue or are bothersome): fever headache muscle aches and pains pain, tenderness, redness, or swelling at the injection site tiredness This list may not describe all possible side effects. Call your doctor for medical advice about side effects. You may report side effects to FDA at 1-800-FDA-1088. Where should I keep my medication? The vaccine will be given by a health care professional in a clinic, pharmacy, doctor's office, or other health care setting. You will not be given vaccine doses to store at home. NOTE: This sheet is a summary. It may not cover all possible information. If you have questions about this medicine, talk to your doctor, pharmacist, or health care provider.  2023 Elsevier/Gold Standard (2020-12-04 00:00:00)

## 2022-03-16 ENCOUNTER — Encounter: Payer: Self-pay | Admitting: Physician Assistant

## 2022-03-17 ENCOUNTER — Other Ambulatory Visit (HOSPITAL_COMMUNITY): Payer: Self-pay

## 2022-03-21 ENCOUNTER — Other Ambulatory Visit (HOSPITAL_COMMUNITY): Payer: Self-pay

## 2022-03-22 ENCOUNTER — Other Ambulatory Visit: Payer: Self-pay | Admitting: Nurse Practitioner

## 2022-03-22 ENCOUNTER — Other Ambulatory Visit (HOSPITAL_COMMUNITY): Payer: Self-pay

## 2022-03-22 ENCOUNTER — Other Ambulatory Visit: Payer: Self-pay | Admitting: Oncology

## 2022-03-22 DIAGNOSIS — C189 Malignant neoplasm of colon, unspecified: Secondary | ICD-10-CM

## 2022-03-23 ENCOUNTER — Other Ambulatory Visit (HOSPITAL_COMMUNITY): Payer: Self-pay

## 2022-03-23 MED ORDER — LONSURF 20-8.19 MG PO TABS
60.0000 mg | ORAL_TABLET | Freq: Two times a day (BID) | ORAL | 0 refills | Status: DC
  Filled 2022-03-23: qty 60, 28d supply, fill #0

## 2022-03-24 ENCOUNTER — Other Ambulatory Visit (HOSPITAL_COMMUNITY): Payer: Self-pay

## 2022-03-25 ENCOUNTER — Other Ambulatory Visit (HOSPITAL_COMMUNITY): Payer: Self-pay

## 2022-03-27 ENCOUNTER — Other Ambulatory Visit: Payer: Self-pay | Admitting: Oncology

## 2022-03-28 ENCOUNTER — Inpatient Hospital Stay: Payer: Medicaid Other

## 2022-03-28 ENCOUNTER — Encounter: Payer: Self-pay | Admitting: Nurse Practitioner

## 2022-03-28 ENCOUNTER — Inpatient Hospital Stay: Payer: Medicaid Other | Admitting: Nurse Practitioner

## 2022-03-28 ENCOUNTER — Inpatient Hospital Stay: Payer: Medicaid Other | Attending: Oncology

## 2022-03-28 VITALS — BP 138/96 | HR 100 | Resp 20

## 2022-03-28 VITALS — BP 145/80 | HR 73 | Temp 98.2°F | Resp 18 | Ht 70.0 in | Wt 222.5 lb

## 2022-03-28 DIAGNOSIS — C787 Secondary malignant neoplasm of liver and intrahepatic bile duct: Secondary | ICD-10-CM

## 2022-03-28 DIAGNOSIS — C187 Malignant neoplasm of sigmoid colon: Secondary | ICD-10-CM

## 2022-03-28 DIAGNOSIS — C189 Malignant neoplasm of colon, unspecified: Secondary | ICD-10-CM | POA: Diagnosis not present

## 2022-03-28 DIAGNOSIS — Z5112 Encounter for antineoplastic immunotherapy: Secondary | ICD-10-CM | POA: Insufficient documentation

## 2022-03-28 LAB — CBC WITH DIFFERENTIAL (CANCER CENTER ONLY)
Abs Immature Granulocytes: 0 10*3/uL (ref 0.00–0.07)
Basophils Absolute: 0 10*3/uL (ref 0.0–0.1)
Basophils Relative: 0 %
Eosinophils Absolute: 0.1 10*3/uL (ref 0.0–0.5)
Eosinophils Relative: 2 %
HCT: 36.1 % — ABNORMAL LOW (ref 39.0–52.0)
Hemoglobin: 11.6 g/dL — ABNORMAL LOW (ref 13.0–17.0)
Immature Granulocytes: 0 %
Lymphocytes Relative: 51 %
Lymphs Abs: 2.2 10*3/uL (ref 0.7–4.0)
MCH: 30.9 pg (ref 26.0–34.0)
MCHC: 32.1 g/dL (ref 30.0–36.0)
MCV: 96 fL (ref 80.0–100.0)
Monocytes Absolute: 0.5 10*3/uL (ref 0.1–1.0)
Monocytes Relative: 11 %
Neutro Abs: 1.5 10*3/uL — ABNORMAL LOW (ref 1.7–7.7)
Neutrophils Relative %: 36 %
Platelet Count: 108 10*3/uL — ABNORMAL LOW (ref 150–400)
RBC: 3.76 MIL/uL — ABNORMAL LOW (ref 4.22–5.81)
RDW: 18 % — ABNORMAL HIGH (ref 11.5–15.5)
WBC Count: 4.3 10*3/uL (ref 4.0–10.5)
nRBC: 0 % (ref 0.0–0.2)

## 2022-03-28 LAB — CMP (CANCER CENTER ONLY)
ALT: 51 U/L — ABNORMAL HIGH (ref 0–44)
AST: 31 U/L (ref 15–41)
Albumin: 4.4 g/dL (ref 3.5–5.0)
Alkaline Phosphatase: 93 U/L (ref 38–126)
Anion gap: 12 (ref 5–15)
BUN: 7 mg/dL (ref 6–20)
CO2: 25 mmol/L (ref 22–32)
Calcium: 9.4 mg/dL (ref 8.9–10.3)
Chloride: 101 mmol/L (ref 98–111)
Creatinine: 0.94 mg/dL (ref 0.61–1.24)
GFR, Estimated: >60 mL/min (ref >60)
Glucose, Bld: 259 mg/dL — ABNORMAL HIGH (ref 70–99)
Potassium: 3.6 mmol/L (ref 3.5–5.1)
Sodium: 138 mmol/L (ref 135–145)
Total Bilirubin: 0.7 mg/dL (ref 0.3–1.2)
Total Protein: 7.9 g/dL (ref 6.5–8.1)

## 2022-03-28 LAB — CEA (ACCESS): CEA (CHCC): 118.37 ng/mL — ABNORMAL HIGH (ref 0.00–5.00)

## 2022-03-28 LAB — TOTAL PROTEIN, URINE DIPSTICK: Protein, ur: NEGATIVE mg/dL

## 2022-03-28 MED ORDER — SODIUM CHLORIDE 0.9 % IV SOLN
5.0000 mg/kg | Freq: Once | INTRAVENOUS | Status: AC
  Administered 2022-03-28: 500 mg via INTRAVENOUS
  Filled 2022-03-28: qty 4

## 2022-03-28 MED ORDER — ONDANSETRON HCL 8 MG PO TABS: 8.0000 mg | ORAL_TABLET | Freq: Three times a day (TID) | ORAL | 1 refills | Status: DC | PRN

## 2022-03-28 MED ORDER — SODIUM CHLORIDE 0.9% FLUSH
10.0000 mL | INTRAVENOUS | Status: DC | PRN
  Administered 2022-03-28: 10 mL

## 2022-03-28 MED ORDER — HEPARIN SOD (PORK) LOCK FLUSH 100 UNIT/ML IV SOLN
500.0000 [IU] | Freq: Once | INTRAVENOUS | Status: AC | PRN
  Administered 2022-03-28: 500 [IU]

## 2022-03-28 MED ORDER — SODIUM CHLORIDE 0.9 % IV SOLN: Freq: Once | INTRAVENOUS | Status: AC

## 2022-03-28 NOTE — Progress Notes (Signed)
[facility] OFFICE PROGRESS NOTE   Diagnosis: Colon cancer  INTERVAL HISTORY:   Jeff [name] returns as scheduled.  He has completed 10 cycles of Lonsurf.  He continues every 2-week bevacizumab.  He tends to have episodes of nausea/vomiting toward the end of each Lonsurf cycle.  No mouth sores.  He has periodic diarrhea.  No rash.  No hand or foot pain or redness.  He denies bleeding.  Objective:  Vital signs in last 24 hours:  Blood pressure (!) 145/80, pulse 73, temperature 98.2 F (36.8 C), temperature source Oral, resp. rate 18, height _0  (1.778 m), weight 222 lb 8 oz (100.9 kg), SpO2 100 %.    HEENT: No thrush or ulcers. Resp: Lungs clear bilaterally. Cardio: Regular rate and rhythm. GI: Abdomen soft and nontender.  No hepatosplenomegaly. Vascular: No leg edema. Neuro: Alert and oriented. Skin: Palms without erythema. Port-A-Cath without erythema.   Lab Results:  Lab Results  Component Value Date   WBC 4.3 03/28/2022   HGB 11.6 (L) 03/28/2022   HCT 36.1 (L) 03/28/2022   MCV 96.0 03/28/2022   PLT 108 (L) 03/28/2022   NEUTROABS 1.5 (L) 03/28/2022    Imaging:  No results found.  Medications: I have reviewed the patient's current medications.  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, biopsy confirmed adenocarcinoma         CTs chest, abdomen, and pelvis on 12/11/2015-indeterminate tiny pulmonary nodules, multiple liver metastases, small nodes in the sigmoid mesocolon Laparoscopic sigmoid colectomy, liver biopsy, Port-A-Cath placement 01/14/2016 Pathology sigmoid colon resection- colonic adenocarcinoma, 5.3 cm extending into pericolonic connective tissue, positive lymph-vascular invasion, positive perineural invasion, negative margins, metastatic carcinoma in 9 of 28 lymph nodes Pathology liver biopsy-metastatic colorectal adenocarcinoma MSI stable; mismatch repair protein  normal APC alteration, K RAS wild-type, no BRAF mutation Cycle 1 FOLFOX 02/02/2016 Cycle 2 FOLFOX 02/15/2016 Cycle 3 FOLFOX 02/29/2016 Cycle 4 FOLFOX 03/14/2016 Cycle 5 FOLFOX 03/28/2016 Cycle 6 FOLFOX 04/11/2016 (oxaliplatin held secondary to thrombocytopenia) 04/21/2016 restaging CTs-difficulty evaluating liver lesions due to hepatic steatosis. Stable right adrenal nodule. No adenopathy or local recurrence near the rectosigmoid anastomotic site. Cycle 7 FOLFOX 04/25/2016 MRI liver 05/02/2016-partial improvement in hepatic metastases Cycle 8 FOLFOX 05/10/2016 Cycle 9 FOLFOX 05/23/2016 (oxaliplatin held due to thrombocytopenia) Cycle 10 FOLFOX 06/06/2016 (oxaliplatin dose reduced due to thrombocytopenia) Cycle 11 FOLFOX 06/27/2016 (oxaliplatin held due to neuropathy) Cycle 12 FOLFOX 07/11/2016 (oxaliplatin held) Initiation of maintenance Xeloda 7 days on/7 days off 07/27/2016 MRI liver 11/18/2016-decrease in hepatic metastatic disease. No new or progressive disease identified within the abdomen. Continuation of Xeloda 7 days on/7 days off MRI liver 04/27/2017-previous liver lesions not identified, no new lesions, no lymphadenopathy Xeloda continued 7 days on/7 days off MRI liver 12/04/2017 - no evidence of metastatic disease, hepatic steatosis Xeloda continued 7 days on/7 days off MRI liver 07/15/2018- no evidence of metastatic disease.  Stable severe hepatic steatosis. Xeloda continued 7 days on/7 days off MRI liver 03/16/2019-hepatic steatosis, no liver mass, focal area of intrahepatic biliary dilatation in segments 2 and 3 of the left lobe-increased Xeloda continued 7 days on/7 days off MRI abdomen 08/19/2019-no findings to suggest liver metastases.  Bilateral lung nodules measuring up to 2.8 cm, progressive and more conspicuous than on previous exam CT chest 08/29/2019-multiple pulmonary metastases, new from 04/21/2016 Cycle 1 FOLFIRI/bevacizumab 09/09/2019 Cycle 2 FOLFIRI/bevacizumab  09/26/2019  Cycle 3 FOLFIRI/bevacizumab 10/10/2019 Cycle 4 FOLFIRI/bevacizumab 10/24/2019 Cycle 5 FOLFIRI/bevacizumab 11/07/2019 CT chest 11/14/2019-decreased  size of lung nodules, no new lesions, hepatic steatosis Cycle 6 FOLFIRI/bevacizumab 11/21/2019 Cycle 7 FOLFIRI/bevacizumab 12/05/2019 Cycle 8 FOLFIRI/bevacizumab 12/19/2019 Cycle 9 FOLFIRI/bevacizumab 01/02/2020 Cycle 10 FOLFIRI/bevacizumab 01/16/2020 CT chest 01/29/2020-stable bilateral pulmonary metastases.  No new or progressive metastatic disease in the chest. Cycle 11 FOLFIRI/bevacizumab 01/30/2020 Cycle 12 FOLFIRI/bevacizumab 02/19/2020 Cycle 13 FOLFIRI/bevacizumab 03/12/2020 Cycle 14 FOLFIRI/bevacizumab 04/02/2020 Cycle 15 FOLFIRI/bevacizumab 04/23/2020 Cycle 16 FOLFIRI/bevacizumab 05/21/2020 CT chest 06/09/2020-mild progression pulmonary metastasis.  Some lesions have increased in size while others are similar. Cycle 1 irinotecan/Panitumumab 06/18/2020 Cycle 2 irinotecan/Panitumumab 07/02/2020 Cycle 3 irinotecan/panitumumab 07/16/2020 Cycle 4 irinotecan/Panitumumab 07/30/2020 Cycle 5 irinotecan/Panitumumab 08/13/2020, Emend added Cycle 6 irinotecan/Panitumumab 08/27/2020 CT chest 09/08/2020-decreased size of pulmonary nodules, no progressive disease Cycle 7 irinotecan/panitumumab 09/02/2020 Cycle 8 irinotecan/panitumumab 09/24/2020 Cycle 9 irinotecan/Panitumumab 10/08/2020 10/22/2020 treatment held due to left foot fracture, need for surgery Cycle 10 irinotecan/panitumumab 11/24/2020 Cycle 11 irinotecan/Panitumumab 12/08/2020 Cycle 12 irinotecan/panitumumab 12/22/2020 Cycle 13 irinotecan/panitumumab 01/05/2021 01/20/2021 CT chest-mixed response with minimal increase in size of some lesions and minimal decrease in the size of other lesions.  Overall number of lesions is unchanged. Cycle 14 irinotecan/Panitumumab 02/05/2021 Cycle 15 irinotecan/Panitumumab 02/23/2021 Cycle 16 irinotecan/panitumumab 03/16/2021 Cycle 17 irinotecan/Panitumumab  04/13/2021 05/03/2021-CT chest-enlargement of pulmonary metastases, no new lesions 06/21/2021-cycle 1 Lonsurf/bevacizumab 07/19/2021-cycle 2 Lonsurf/bevacizumab 08/16/2021-cycle 3 Lonsurf/bevacizumab 09/09/2021 CT chest-bilateral lung nodules and masses with mixed response, overall stable to very minimally increased 09/13/2021 cycle 4 Lonsurf/bevacizumab 10/12/2021 cycle 5 Lonsurf/bevacizumab 10/21/2021 chest CT-bilateral pulmonary metastases without significant change.  No new or progressive disease within the chest. 11/08/2021 cycle 6 Lonsurf/bevacizumab 12/06/2021 cycle 7 Lonsurf/bevacizumab 01/03/2022 cycle 8 Lonsurf/bevacizumab CTs 01/25/2022-index lung lesion stable to mildly increased in size.  Mild retroperitoneal adenopathy, mildly increased in size compared to 12/16/2020. 02/01/2022 cycle 9 Lonsurf/bevacizumab 02/28/2022 cycle 10 Lonsurf/bevacizumab 03/28/2022 cycle 11 Lonsurf/bevacizumab   2.   Rectal bleeding and constipation secondary to #1   3.   History of peripheral vascular disease, status post left lower extremity vascular bypass surgery in April 2017   4.   History of nephrolithiasis   5.   History of Graves' disease treated with radioactive iodine   6.   Anxiety/depression   7.   Hypertension   8.   Hospitalization 01/19/2016 with wound dehiscence status post secondary suture closure of abdominal wall   9.   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 Hyperglycemia 06/20/2016-diagnosed with diabetes, maintained on insulin   11.  Positive COVID test 12/13/2018; positive COVID test 01/25/2021   12. Hospitalized with seizure activity/DKA. Now on Keppra, insulin adjusted. 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  Left  foot fracture-surgical repair 11/11/2020  Disposition: Mr. Hustead appears stable.  He has completed 10 cycles of Lonsurf/every 2-week bevacizumab.  There is no clinical evidence of disease progression.  He will begin cycle 11 Lonsurf today as scheduled, continue every 2-week bevacizumab.  Plan for restaging CTs prior to cycle 12 Lonsurf.  CBC and chemistry panel reviewed.  Labs adequate to proceed as above.  He has mild neutropenia and mild thrombocytopenia.  He understands to contact the office with fever, chills, other signs of infection, bleeding.  He will return for lab, follow-up, Avastin in 2 weeks.  He will contact the office in the interim as outlined above or with any other problems.  Ned Card  ANP/GNP-BC   03/28/2022  9:43 AM

## 2022-03-28 NOTE — Progress Notes (Signed)
Patient seen by Lisa Thomas NP today  Vitals are within treatment parameters.  Labs reviewed by Lisa Thomas NP and are within treatment parameters.  Per physician team, patient is ready for treatment and there are NO modifications to the treatment plan.     

## 2022-03-28 NOTE — Patient Instructions (Signed)
[facility]   Discharge Instructions: Thank you for choosing [facility] to provide your oncology and hematology care.   If you have a lab appointment with the [facility], please go directly to the [facility] and check in at the registration area.   Wear comfortable clothing and clothing appropriate for easy access to any Portacath or PICC line.   We strive to give you quality time with your provider. You may need to reschedule your appointment if you arrive late (15 or more minutes).  Arriving late affects you and other patients whose appointments are after yours.  Also, if you miss three or more appointments without notifying the office, you may be dismissed from the clinic at the provider's discretion.      For prescription refill requests, have your pharmacy contact our office and allow 72 hours for refills to be completed.    Today you received the following chemotherapy and/or immunotherapy agents Bevacizumab-awwb (MVASI).      To help prevent nausea and vomiting after your treatment, we encourage you to take your nausea medication as directed.  BELOW ARE SYMPTOMS THAT SHOULD BE REPORTED IMMEDIATELY: *FEVER GREATER THAN 100.4 F (38 C) OR HIGHER *CHILLS OR SWEATING *NAUSEA AND VOMITING THAT IS NOT CONTROLLED WITH YOUR NAUSEA MEDICATION *UNUSUAL SHORTNESS OF BREATH *UNUSUAL BRUISING OR BLEEDING *URINARY PROBLEMS (pain or burning when urinating, or frequent urination) *BOWEL PROBLEMS (unusual diarrhea, constipation, pain near the anus) TENDERNESS IN MOUTH AND THROAT WITH OR WITHOUT PRESENCE OF ULCERS (sore throat, sores in mouth, or a toothache) UNUSUAL RASH, SWELLING OR PAIN  UNUSUAL VAGINAL DISCHARGE OR ITCHING   Items with * indicate a potential emergency and should be followed up as soon as possible or go to the Emergency Department if any problems should occur.  Please show the CHEMOTHERAPY ALERT CARD or IMMUNOTHERAPY ALERT CARD  at check-in to the Emergency Department and triage nurse.  Should you have questions after your visit or need to cancel or reschedule your appointment, please contact [facility]  Dept: [phone]  and follow the prompts.  Office hours are 8:00 a.m. to 4:30 p.m. Monday - Friday. Please note that voicemails left after 4:00 p.m. may not be returned until the following business day.  We are closed weekends and major holidays. You have access to a nurse at all times for urgent questions. Please call the main number to the clinic Dept: [phone] and follow the prompts.   For any non-urgent questions, you may also contact your provider using MyChart. We now offer e-Visits for anyone 78 and older to request care online for non-urgent symptoms. For details visit [email or web address].   Also download the MyChart app! Go to the app store, search "MyChart", open the app, select [facility], and log in with your MyChart username and password.  Masks are optional in the cancer centers. If you would like for your care team to wear a mask while they are taking care of you, please let them know. You may have one support person who is at least 55 years old accompany you for your appointments.  Bevacizumab Injection What is this medication? BEVACIZUMAB (be va SIZ yoo mab) treats some types of cancer. It works by blocking a protein that causes cancer cells to grow and multiply. This helps to slow or stop the spread of cancer cells. It is a monoclonal antibody. This medicine may be used for other purposes; ask your health care  provider or pharmacist if you have questions. COMMON BRAND NAME(S): Alymsys, Avastin, MVASI, [name] What should I tell my care team before I take this medication? They need to know if you have any of these conditions: Blood clots Coughing up blood Having or recent surgery Heart failure High blood pressure History of a connection between 2 or more body  parts that do not usually connect (fistula) History of a tear in your stomach or intestines Protein in your urine An unusual or allergic reaction to bevacizumab, other medications, foods, dyes, or preservatives Pregnant or trying to get pregnant Breast-feeding How should I use this medication? This medication is injected into a vein. It is given by your care team in a hospital or clinic setting. Talk to your care team the use of this medication in children. Special care may be needed. Overdosage: If you think you have taken too much of this medicine contact a poison control center or emergency room at once. NOTE: This medicine is only for you. Do not share this medicine with others. What if I miss a dose? Keep appointments for follow-up doses. It is important not to miss your dose. Call your care team if you are unable to keep an appointment. What may interact with this medication? Interactions are not expected. This list may not describe all possible interactions. Give your health care provider a list of all the medicines, herbs, non-prescription drugs, or dietary supplements you use. Also tell them if you smoke, drink alcohol, or use illegal drugs. Some items may interact with your medicine. What should I watch for while using this medication? Your condition will be monitored carefully while you are receiving this medication. You may need blood work while taking this medication. This medication may make you feel generally unwell. This is not uncommon as chemotherapy can affect healthy cells as well as cancer cells. Report any side effects. Continue your course of treatment even though you feel ill unless your care team tells you to stop. This medication may increase your risk to bruise or bleed. Call your care team if you notice any unusual bleeding. Before having surgery, talk to your care team to make sure it is ok. This medication can increase the risk of poor healing of your surgical site  or wound. You will need to stop this medication for 28 days before surgery. After surgery, wait at least 28 days before restarting this medication. Make sure the surgical site or wound is healed enough before restarting this medication. Talk to your care team if questions. Talk to your care team if you may be pregnant. Serious birth defects can occur if you take this medication during pregnancy and for 6 months after the last dose. Contraception is recommended while taking this medication and for 6 months after the last dose. Your care team can help you find the option that works for you. Do not breastfeed while taking this medication and for 6 months after the last dose. This medication can cause infertility. Talk to your care team if you are concerned about your fertility. What side effects may I notice from receiving this medication? Side effects that you should report to your care team as soon as possible: Allergic reactions--skin rash, itching, hives, swelling of the face, lips, tongue, or throat Bleeding--bloody or black, tar-like stools, vomiting blood or brown material that looks like coffee grounds, red or dark brown urine, small red or purple spots on skin, unusual bruising or bleeding Blood clot--pain, swelling, or warmth in  the leg, shortness of breath, chest pain Heart attack--pain or tightness in the chest, shoulders, arms, or jaw, nausea, shortness of breath, cold or clammy skin, feeling faint or lightheaded Heart failure--shortness of breath, swelling of the ankles, feet, or hands, sudden weight gain, unusual weakness or fatigue Increase in blood pressure Infection--fever, chills, cough, sore throat, wounds that don't heal, pain or trouble when passing urine, general feeling of discomfort or being unwell Infusion reactions--chest pain, shortness of breath or trouble breathing, feeling faint or lightheaded Kidney injury--decrease in the amount of urine, swelling of the ankles, hands, or  feet Stomach pain that is severe, does not go away, or gets worse Stroke--sudden numbness or weakness of the face, arm, or leg, trouble speaking, confusion, trouble walking, loss of balance or coordination, dizziness, severe headache, change in vision Sudden and severe headache, confusion, change in vision, seizures, which may be signs of posterior reversible encephalopathy syndrome (PRES) Side effects that usually do not require medical attention (report to your care team if they continue or are bothersome): Back pain Change in taste Diarrhea Dry skin Increased tears Nosebleed This list may not describe all possible side effects. Call your doctor for medical advice about side effects. You may report side effects to FDA at 1-800-FDA-1088. Where should I keep my medication? This medication is given in a hospital or clinic. It will not be stored at home. NOTE: This sheet is a summary. It may not cover all possible information. If you have questions about this medicine, talk to your doctor, pharmacist, or health care provider.  2023 Elsevier/Gold Standard (2021-09-03 00:00:00)

## 2022-03-29 ENCOUNTER — Other Ambulatory Visit (HOSPITAL_COMMUNITY): Payer: Self-pay

## 2022-04-10 ENCOUNTER — Other Ambulatory Visit: Payer: Self-pay | Admitting: Oncology

## 2022-04-11 ENCOUNTER — Inpatient Hospital Stay: Payer: Medicaid Other

## 2022-04-11 ENCOUNTER — Inpatient Hospital Stay: Payer: Medicaid Other | Admitting: Oncology

## 2022-04-11 ENCOUNTER — Encounter: Payer: Self-pay | Admitting: *Deleted

## 2022-04-11 ENCOUNTER — Other Ambulatory Visit (HOSPITAL_COMMUNITY): Payer: Self-pay

## 2022-04-11 VITALS — BP 154/89 | HR 92

## 2022-04-11 DIAGNOSIS — Z5112 Encounter for antineoplastic immunotherapy: Secondary | ICD-10-CM | POA: Diagnosis not present

## 2022-04-11 DIAGNOSIS — C187 Malignant neoplasm of sigmoid colon: Secondary | ICD-10-CM

## 2022-04-11 DIAGNOSIS — C787 Secondary malignant neoplasm of liver and intrahepatic bile duct: Secondary | ICD-10-CM

## 2022-04-11 LAB — CBC WITH DIFFERENTIAL (CANCER CENTER ONLY)
Abs Immature Granulocytes: 0.03 10*3/uL (ref 0.00–0.07)
Basophils Absolute: 0 10*3/uL (ref 0.0–0.1)
Basophils Relative: 0 %
Eosinophils Absolute: 0.1 10*3/uL (ref 0.0–0.5)
Eosinophils Relative: 2 %
HCT: 30.2 % — ABNORMAL LOW (ref 39.0–52.0)
Hemoglobin: 10 g/dL — ABNORMAL LOW (ref 13.0–17.0)
Immature Granulocytes: 1 %
Lymphocytes Relative: 48 %
Lymphs Abs: 2.6 10*3/uL (ref 0.7–4.0)
MCH: 31 pg (ref 26.0–34.0)
MCHC: 33.1 g/dL (ref 30.0–36.0)
MCV: 93.5 fL (ref 80.0–100.0)
Monocytes Absolute: 0.3 10*3/uL (ref 0.1–1.0)
Monocytes Relative: 5 %
Neutro Abs: 2.4 10*3/uL (ref 1.7–7.7)
Neutrophils Relative %: 44 %
Platelet Count: 50 10*3/uL — ABNORMAL LOW (ref 150–400)
RBC: 3.23 MIL/uL — ABNORMAL LOW (ref 4.22–5.81)
RDW: 15.9 % — ABNORMAL HIGH (ref 11.5–15.5)
WBC Count: 5.3 10*3/uL (ref 4.0–10.5)
nRBC: 0 % (ref 0.0–0.2)

## 2022-04-11 LAB — CEA (ACCESS): CEA (CHCC): 100.59 ng/mL — ABNORMAL HIGH (ref 0.00–5.00)

## 2022-04-11 LAB — CMP (CANCER CENTER ONLY)
ALT: 56 U/L — ABNORMAL HIGH (ref 0–44)
AST: 39 U/L (ref 15–41)
Albumin: 4.3 g/dL (ref 3.5–5.0)
Alkaline Phosphatase: 82 U/L (ref 38–126)
Anion gap: 13 (ref 5–15)
BUN: 11 mg/dL (ref 6–20)
CO2: 25 mmol/L (ref 22–32)
Calcium: 8.9 mg/dL (ref 8.9–10.3)
Chloride: 98 mmol/L (ref 98–111)
Creatinine: 0.91 mg/dL (ref 0.61–1.24)
GFR, Estimated: >60 mL/min (ref >60)
Glucose, Bld: 202 mg/dL — ABNORMAL HIGH (ref 70–99)
Potassium: 3.7 mmol/L (ref 3.5–5.1)
Sodium: 136 mmol/L (ref 135–145)
Total Bilirubin: 0.7 mg/dL (ref 0.3–1.2)
Total Protein: 7.6 g/dL (ref 6.5–8.1)

## 2022-04-11 LAB — TOTAL PROTEIN, URINE DIPSTICK: Protein, ur: NEGATIVE mg/dL

## 2022-04-11 MED ORDER — SODIUM CHLORIDE 0.9 % IV SOLN: Freq: Once | INTRAVENOUS | Status: AC

## 2022-04-11 MED ORDER — HEPARIN SOD (PORK) LOCK FLUSH 100 UNIT/ML IV SOLN
500.0000 [IU] | Freq: Once | INTRAVENOUS | Status: AC | PRN
  Administered 2022-04-11: 500 [IU]

## 2022-04-11 MED ORDER — SODIUM CHLORIDE 0.9% FLUSH
10.0000 mL | INTRAVENOUS | Status: DC | PRN
  Administered 2022-04-11: 10 mL

## 2022-04-11 MED ORDER — SODIUM CHLORIDE 0.9 % IV SOLN
5.0000 mg/kg | Freq: Once | INTRAVENOUS | Status: AC
  Administered 2022-04-11: 500 mg via INTRAVENOUS
  Filled 2022-04-11: qty 16

## 2022-04-11 NOTE — Progress Notes (Signed)
Patient seen by Dr. Benay Spice today  Vitals are within treatment parameters.  Labs reviewed by Dr. Benay Spice and are not all within treatment parameters. OK to treat w/Avastin with platelet count 50,000 . No intervention needed for glucose 202  Per physician team, patient is ready for treatment and there are NO modifications to the treatment plan.

## 2022-04-11 NOTE — Patient Instructions (Signed)
[facility]   Discharge Instructions: Thank you for choosing [facility] to provide your oncology and hematology care.   If you have a lab appointment with the [facility], please go directly to the [facility] and check in at the registration area.   Wear comfortable clothing and clothing appropriate for easy access to any Portacath or PICC line.   We strive to give you quality time with your provider. You may need to reschedule your appointment if you arrive late (15 or more minutes).  Arriving late affects you and other patients whose appointments are after yours.  Also, if you miss three or more appointments without notifying the office, you may be dismissed from the clinic at the provider's discretion.      For prescription refill requests, have your pharmacy contact our office and allow 72 hours for refills to be completed.    Today you received the following chemotherapy and/or immunotherapy agents Bevacizumab-awwb (MVASI).      To help prevent nausea and vomiting after your treatment, we encourage you to take your nausea medication as directed.  BELOW ARE SYMPTOMS THAT SHOULD BE REPORTED IMMEDIATELY: *FEVER GREATER THAN 100.4 F (38 C) OR HIGHER *CHILLS OR SWEATING *NAUSEA AND VOMITING THAT IS NOT CONTROLLED WITH YOUR NAUSEA MEDICATION *UNUSUAL SHORTNESS OF BREATH *UNUSUAL BRUISING OR BLEEDING *URINARY PROBLEMS (pain or burning when urinating, or frequent urination) *BOWEL PROBLEMS (unusual diarrhea, constipation, pain near the anus) TENDERNESS IN MOUTH AND THROAT WITH OR WITHOUT PRESENCE OF ULCERS (sore throat, sores in mouth, or a toothache) UNUSUAL RASH, SWELLING OR PAIN  UNUSUAL VAGINAL DISCHARGE OR ITCHING   Items with * indicate a potential emergency and should be followed up as soon as possible or go to the Emergency Department if any problems should occur.  Please show the CHEMOTHERAPY ALERT CARD or IMMUNOTHERAPY ALERT CARD  at check-in to the Emergency Department and triage nurse.  Should you have questions after your visit or need to cancel or reschedule your appointment, please contact [facility]  Dept: [phone]  and follow the prompts.  Office hours are 8:00 a.m. to 4:30 p.m. Monday - Friday. Please note that voicemails left after 4:00 p.m. may not be returned until the following business day.  We are closed weekends and major holidays. You have access to a nurse at all times for urgent questions. Please call the main number to the clinic Dept: [phone] and follow the prompts.   For any non-urgent questions, you may also contact your provider using MyChart. We now offer e-Visits for anyone 45 and older to request care online for non-urgent symptoms. For details visit [email or web address].   Also download the MyChart app! Go to the app store, search "MyChart", open the app, select [facility], and log in with your MyChart username and password.  Masks are optional in the cancer centers. If you would like for your care team to wear a mask while they are taking care of you, please let them know. You may have one support person who is at least 55 years old accompany you for your appointments.  Bevacizumab Injection What is this medication? BEVACIZUMAB (be va SIZ yoo mab) treats some types of cancer. It works by blocking a protein that causes cancer cells to grow and multiply. This helps to slow or stop the spread of cancer cells. It is a monoclonal antibody. This medicine may be used for other purposes; ask your health care  provider or pharmacist if you have questions. COMMON BRAND NAME(S): Alymsys, Avastin, MVASI, [name] What should I tell my care team before I take this medication? They need to know if you have any of these conditions: Blood clots Coughing up blood Having or recent surgery Heart failure High blood pressure History of a connection between 2 or more body  parts that do not usually connect (fistula) History of a tear in your stomach or intestines Protein in your urine An unusual or allergic reaction to bevacizumab, other medications, foods, dyes, or preservatives Pregnant or trying to get pregnant Breast-feeding How should I use this medication? This medication is injected into a vein. It is given by your care team in a hospital or clinic setting. Talk to your care team the use of this medication in children. Special care may be needed. Overdosage: If you think you have taken too much of this medicine contact a poison control center or emergency room at once. NOTE: This medicine is only for you. Do not share this medicine with others. What if I miss a dose? Keep appointments for follow-up doses. It is important not to miss your dose. Call your care team if you are unable to keep an appointment. What may interact with this medication? Interactions are not expected. This list may not describe all possible interactions. Give your health care provider a list of all the medicines, herbs, non-prescription drugs, or dietary supplements you use. Also tell them if you smoke, drink alcohol, or use illegal drugs. Some items may interact with your medicine. What should I watch for while using this medication? Your condition will be monitored carefully while you are receiving this medication. You may need blood work while taking this medication. This medication may make you feel generally unwell. This is not uncommon as chemotherapy can affect healthy cells as well as cancer cells. Report any side effects. Continue your course of treatment even though you feel ill unless your care team tells you to stop. This medication may increase your risk to bruise or bleed. Call your care team if you notice any unusual bleeding. Before having surgery, talk to your care team to make sure it is ok. This medication can increase the risk of poor healing of your surgical site  or wound. You will need to stop this medication for 28 days before surgery. After surgery, wait at least 28 days before restarting this medication. Make sure the surgical site or wound is healed enough before restarting this medication. Talk to your care team if questions. Talk to your care team if you may be pregnant. Serious birth defects can occur if you take this medication during pregnancy and for 6 months after the last dose. Contraception is recommended while taking this medication and for 6 months after the last dose. Your care team can help you find the option that works for you. Do not breastfeed while taking this medication and for 6 months after the last dose. This medication can cause infertility. Talk to your care team if you are concerned about your fertility. What side effects may I notice from receiving this medication? Side effects that you should report to your care team as soon as possible: Allergic reactions--skin rash, itching, hives, swelling of the face, lips, tongue, or throat Bleeding--bloody or black, tar-like stools, vomiting blood or brown material that looks like coffee grounds, red or dark brown urine, small red or purple spots on skin, unusual bruising or bleeding Blood clot--pain, swelling, or warmth in  the leg, shortness of breath, chest pain Heart attack--pain or tightness in the chest, shoulders, arms, or jaw, nausea, shortness of breath, cold or clammy skin, feeling faint or lightheaded Heart failure--shortness of breath, swelling of the ankles, feet, or hands, sudden weight gain, unusual weakness or fatigue Increase in blood pressure Infection--fever, chills, cough, sore throat, wounds that don't heal, pain or trouble when passing urine, general feeling of discomfort or being unwell Infusion reactions--chest pain, shortness of breath or trouble breathing, feeling faint or lightheaded Kidney injury--decrease in the amount of urine, swelling of the ankles, hands, or  feet Stomach pain that is severe, does not go away, or gets worse Stroke--sudden numbness or weakness of the face, arm, or leg, trouble speaking, confusion, trouble walking, loss of balance or coordination, dizziness, severe headache, change in vision Sudden and severe headache, confusion, change in vision, seizures, which may be signs of posterior reversible encephalopathy syndrome (PRES) Side effects that usually do not require medical attention (report to your care team if they continue or are bothersome): Back pain Change in taste Diarrhea Dry skin Increased tears Nosebleed This list may not describe all possible side effects. Call your doctor for medical advice about side effects. You may report side effects to FDA at 1-800-FDA-1088. Where should I keep my medication? This medication is given in a hospital or clinic. It will not be stored at home. NOTE: This sheet is a summary. It may not cover all possible information. If you have questions about this medicine, talk to your doctor, pharmacist, or health care provider.  2023 Elsevier/Gold Standard (2021-09-03 00:00:00)

## 2022-04-11 NOTE — Progress Notes (Signed)
[name] OFFICE PROGRESS NOTE   Diagnosis: Colon cancer  INTERVAL HISTORY:   Jeff [name] returns as scheduled.  He feels well.  He completed another cycle of Lonsurf beginning 03/28/2022.  No mouth sores or nausea.  He reports mild diarrhea after completing Lonsurf.  No bleeding or symptom of thrombosis.  No dyspnea.  Objective:  Vital signs in last 24 hours:  Blood pressure (!) 140/80, pulse 100, temperature 98.1 F (36.7 C), temperature source Oral, resp. rate 18, height _0  (1.778 m), weight 222 lb 12.8 oz (101.1 kg), SpO2 100 %.    HEENT: No thrush or ulcers, small areas of erythema at the left buccal mucosa Resp: Clear bilaterally Cardio: Regular rate and rhythm GI: No hepatosplenomegaly Vascular: No leg edema    Portacath/PICC-without erythema  Lab Results:  Lab Results  Component Value Date   WBC 5.3 04/11/2022   HGB 10.0 (L) 04/11/2022   HCT 30.2 (L) 04/11/2022   MCV 93.5 04/11/2022   PLT 50 (L) 04/11/2022   NEUTROABS 2.4 04/11/2022    CMP  Lab Results  Component Value Date   NA 136 04/11/2022   K 3.7 04/11/2022   CL 98 04/11/2022   CO2 25 04/11/2022   GLUCOSE 202 (H) 04/11/2022   BUN 11 04/11/2022   CREATININE 0.91 04/11/2022   CALCIUM 8.9 04/11/2022   PROT 7.6 04/11/2022   ALBUMIN 4.3 04/11/2022   AST 39 04/11/2022   ALT 56 (H) 04/11/2022   ALKPHOS 82 04/11/2022   BILITOT 0.7 04/11/2022   GFRNONAA >60 04/11/2022   GFRAA >60 01/30/2020    Lab Results  Component Value Date   CEA1 3.19 09/10/2020   CEA 100.59 (H) 04/11/2022      Medications: I have reviewed the patient's current medications.   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, biopsy confirmed adenocarcinoma         CTs chest, abdomen, and pelvis on 12/11/2015-indeterminate tiny pulmonary nodules, multiple liver metastases, small nodes in the sigmoid mesocolon Laparoscopic sigmoid colectomy,  liver biopsy, Port-A-Cath placement 01/14/2016 Pathology sigmoid colon resection- colonic adenocarcinoma, 5.3 cm extending into pericolonic connective tissue, positive lymph-vascular invasion, positive perineural invasion, negative margins, metastatic carcinoma in 9 of 28 lymph nodes Pathology liver biopsy-metastatic colorectal adenocarcinoma MSI stable; mismatch repair protein normal APC alteration, K RAS wild-type, no BRAF mutation Cycle 1 FOLFOX 02/02/2016 Cycle 2 FOLFOX 02/15/2016 Cycle 3 FOLFOX 02/29/2016 Cycle 4 FOLFOX 03/14/2016 Cycle 5 FOLFOX 03/28/2016 Cycle 6 FOLFOX 04/11/2016 (oxaliplatin held secondary to thrombocytopenia) 04/21/2016 restaging CTs-difficulty evaluating liver lesions due to hepatic steatosis. Stable right adrenal nodule. No adenopathy or local recurrence near the rectosigmoid anastomotic site. Cycle 7 FOLFOX 04/25/2016 MRI liver 05/02/2016-partial improvement in hepatic metastases Cycle 8 FOLFOX 05/10/2016 Cycle 9 FOLFOX 05/23/2016 (oxaliplatin held due to thrombocytopenia) Cycle 10 FOLFOX 06/06/2016 (oxaliplatin dose reduced due to thrombocytopenia) Cycle 11 FOLFOX 06/27/2016 (oxaliplatin held due to neuropathy) Cycle 12 FOLFOX 07/11/2016 (oxaliplatin held) Initiation of maintenance Xeloda 7 days on/7 days off 07/27/2016 MRI liver 11/18/2016-decrease in hepatic metastatic disease. No new or progressive disease identified within the abdomen. Continuation of Xeloda 7 days on/7 days off MRI liver 04/27/2017-previous liver lesions not identified, no new lesions, no lymphadenopathy Xeloda continued 7 days on/7 days off MRI liver 12/04/2017 - no evidence of metastatic disease, hepatic steatosis Xeloda continued 7 days on/7 days off MRI liver 07/15/2018- no evidence of metastatic disease.  Stable severe hepatic steatosis. Xeloda continued 7 days on/7  days off MRI liver 03/16/2019-hepatic steatosis, no liver mass, focal area of intrahepatic biliary dilatation in segments  2 and 3 of the left lobe-increased Xeloda continued 7 days on/7 days off MRI abdomen 08/19/2019-no findings to suggest liver metastases.  Bilateral lung nodules measuring up to 2.8 cm, progressive and more conspicuous than on previous exam CT chest 08/29/2019-multiple pulmonary metastases, new from 04/21/2016 Cycle 1 FOLFIRI/bevacizumab 09/09/2019 Cycle 2 FOLFIRI/bevacizumab 09/26/2019  Cycle 3 FOLFIRI/bevacizumab 10/10/2019 Cycle 4 FOLFIRI/bevacizumab 10/24/2019 Cycle 5 FOLFIRI/bevacizumab 11/07/2019 CT chest 11/14/2019-decreased size of lung nodules, no new lesions, hepatic steatosis Cycle 6 FOLFIRI/bevacizumab 11/21/2019 Cycle 7 FOLFIRI/bevacizumab 12/05/2019 Cycle 8 FOLFIRI/bevacizumab 12/19/2019 Cycle 9 FOLFIRI/bevacizumab 01/02/2020 Cycle 10 FOLFIRI/bevacizumab 01/16/2020 CT chest 01/29/2020-stable bilateral pulmonary metastases.  No new or progressive metastatic disease in the chest. Cycle 11 FOLFIRI/bevacizumab 01/30/2020 Cycle 12 FOLFIRI/bevacizumab 02/19/2020 Cycle 13 FOLFIRI/bevacizumab 03/12/2020 Cycle 14 FOLFIRI/bevacizumab 04/02/2020 Cycle 15 FOLFIRI/bevacizumab 04/23/2020 Cycle 16 FOLFIRI/bevacizumab 05/21/2020 CT chest 06/09/2020-mild progression pulmonary metastasis.  Some lesions have increased in size while others are similar. Cycle 1 irinotecan/Panitumumab 06/18/2020 Cycle 2 irinotecan/Panitumumab 07/02/2020 Cycle 3 irinotecan/panitumumab 07/16/2020 Cycle 4 irinotecan/Panitumumab 07/30/2020 Cycle 5 irinotecan/Panitumumab 08/13/2020, Emend added Cycle 6 irinotecan/Panitumumab 08/27/2020 CT chest 09/08/2020-decreased size of pulmonary nodules, no progressive disease Cycle 7 irinotecan/panitumumab 09/02/2020 Cycle 8 irinotecan/panitumumab 09/24/2020 Cycle 9 irinotecan/Panitumumab 10/08/2020 10/22/2020 treatment held due to left foot fracture, need for surgery Cycle 10 irinotecan/panitumumab 11/24/2020 Cycle 11 irinotecan/Panitumumab 12/08/2020 Cycle 12 irinotecan/panitumumab 12/22/2020 Cycle 13  irinotecan/panitumumab 01/05/2021 01/20/2021 CT chest-mixed response with minimal increase in size of some lesions and minimal decrease in the size of other lesions.  Overall number of lesions is unchanged. Cycle 14 irinotecan/Panitumumab 02/05/2021 Cycle 15 irinotecan/Panitumumab 02/23/2021 Cycle 16 irinotecan/panitumumab 03/16/2021 Cycle 17 irinotecan/Panitumumab 04/13/2021 05/03/2021-CT chest-enlargement of pulmonary metastases, no new lesions 06/21/2021-cycle 1 Lonsurf/bevacizumab 07/19/2021-cycle 2 Lonsurf/bevacizumab 08/16/2021-cycle 3 Lonsurf/bevacizumab 09/09/2021 CT chest-bilateral lung nodules and masses with mixed response, overall stable to very minimally increased 09/13/2021 cycle 4 Lonsurf/bevacizumab 10/12/2021 cycle 5 Lonsurf/bevacizumab 10/21/2021 chest CT-bilateral pulmonary metastases without significant change.  No new or progressive disease within the chest. 11/08/2021 cycle 6 Lonsurf/bevacizumab 12/06/2021 cycle 7 Lonsurf/bevacizumab 01/03/2022 cycle 8 Lonsurf/bevacizumab CTs 01/25/2022-index lung lesion stable to mildly increased in size.  Mild retroperitoneal adenopathy, mildly increased in size compared to 12/16/2020. 02/01/2022 cycle 9 Lonsurf/bevacizumab 02/28/2022 cycle 10 Lonsurf/bevacizumab 03/28/2022 cycle 11 Lonsurf/bevacizumab   2.   Rectal bleeding and constipation secondary to #1   3.   History of peripheral vascular disease, status post left lower extremity vascular bypass surgery in April 2017   4.   History of nephrolithiasis   5.   History of Graves' disease treated with radioactive iodine   6.   Anxiety/depression   7.   Hypertension   8.   Hospitalization 01/19/2016 with wound dehiscence status post secondary suture closure of abdominal wall   9.   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 Hyperglycemia 06/20/2016-diagnosed with diabetes, maintained on insulin   11.  Positive COVID test 12/13/2018;  positive COVID test 01/25/2021   12. Hospitalized with seizure activity/DKA. Now on Keppra, insulin adjusted. 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  Left foot fracture-surgical repair 11/11/2020    Disposition: Mr Jeff [name] appears stable.  Continue to treat with Lonsurf/bevacizumab.  He is tolerating the treatment well.  He has moderate thrombocytopenia.  The thrombocytopenia is likely secondary to chemotherapy versus liver toxicity from chemotherapy.  He will undergo a restaging CT evaluation prior to an office visit in 2 weeks.  He will hold aspirin until the platelet count is higher.  He will call for bleeding.  Betsy Coder, MD  04/11/2022  9:53 AM

## 2022-04-14 ENCOUNTER — Other Ambulatory Visit (HOSPITAL_COMMUNITY): Payer: Self-pay

## 2022-04-15 ENCOUNTER — Other Ambulatory Visit: Payer: Self-pay | Admitting: Oncology

## 2022-04-15 ENCOUNTER — Other Ambulatory Visit (HOSPITAL_COMMUNITY): Payer: Self-pay

## 2022-04-15 DIAGNOSIS — C787 Secondary malignant neoplasm of liver and intrahepatic bile duct: Secondary | ICD-10-CM

## 2022-04-15 NOTE — Telephone Encounter (Signed)
Will wait to see CT results on 04/21/22 before refill

## 2022-04-18 ENCOUNTER — Other Ambulatory Visit (HOSPITAL_COMMUNITY): Payer: Self-pay

## 2022-04-20 ENCOUNTER — Other Ambulatory Visit (HOSPITAL_COMMUNITY): Payer: Self-pay

## 2022-04-21 ENCOUNTER — Encounter (HOSPITAL_BASED_OUTPATIENT_CLINIC_OR_DEPARTMENT_OTHER): Payer: Self-pay

## 2022-04-21 ENCOUNTER — Ambulatory Visit (HOSPITAL_BASED_OUTPATIENT_CLINIC_OR_DEPARTMENT_OTHER)
Admission: RE | Admit: 2022-04-21 | Discharge: 2022-04-21 | Disposition: A | Discharge status: Home / Self Care | Payer: Medicaid Other | Source: Ambulatory Visit | Attending: Oncology | Admitting: Oncology

## 2022-04-21 ENCOUNTER — Other Ambulatory Visit (HOSPITAL_COMMUNITY): Payer: Self-pay

## 2022-04-21 ENCOUNTER — Inpatient Hospital Stay: Payer: Medicaid Other | Attending: Oncology

## 2022-04-21 DIAGNOSIS — R970 Elevated carcinoembryonic antigen [CEA]: Secondary | ICD-10-CM | POA: Insufficient documentation

## 2022-04-21 DIAGNOSIS — D709 Neutropenia, unspecified: Secondary | ICD-10-CM | POA: Insufficient documentation

## 2022-04-21 DIAGNOSIS — C187 Malignant neoplasm of sigmoid colon: Secondary | ICD-10-CM | POA: Insufficient documentation

## 2022-04-21 DIAGNOSIS — Z8616 Personal history of COVID-19: Secondary | ICD-10-CM | POA: Insufficient documentation

## 2022-04-21 DIAGNOSIS — C7802 Secondary malignant neoplasm of left lung: Secondary | ICD-10-CM | POA: Insufficient documentation

## 2022-04-21 DIAGNOSIS — C7801 Secondary malignant neoplasm of right lung: Secondary | ICD-10-CM | POA: Insufficient documentation

## 2022-04-21 DIAGNOSIS — C787 Secondary malignant neoplasm of liver and intrahepatic bile duct: Secondary | ICD-10-CM | POA: Insufficient documentation

## 2022-04-21 MED ORDER — IOHEXOL 300 MG/ML  SOLN
100.0000 mL | Freq: Once | INTRAMUSCULAR | Status: AC | PRN
  Administered 2022-04-21: 85 mL via INTRAVENOUS

## 2022-04-21 MED ORDER — HEPARIN SOD (PORK) LOCK FLUSH 100 UNIT/ML IV SOLN
500.0000 [IU] | Freq: Once | INTRAVENOUS | Status: AC
  Administered 2022-04-21: 500 [IU] via INTRAVENOUS

## 2022-04-21 NOTE — Progress Notes (Signed)
Susie from [facility] called for a medical release on this pt. Per Susie, pt was to have 13 teeth extracted today. This RN informed her that pt is on Avastin which needs to be held for a length of time prior to procedures. Susie verbalized understanding stating that their office will not proceed with the procedure today.   [facility] fax: [phone].   Pt will discuss with MD Sherrill at his upcoming appt.

## 2022-04-22 ENCOUNTER — Other Ambulatory Visit (HOSPITAL_COMMUNITY): Payer: Self-pay

## 2022-04-24 ENCOUNTER — Other Ambulatory Visit: Payer: Self-pay | Admitting: Oncology

## 2022-04-25 ENCOUNTER — Encounter: Payer: Self-pay | Admitting: *Deleted

## 2022-04-25 ENCOUNTER — Encounter: Payer: Self-pay | Admitting: Oncology

## 2022-04-25 ENCOUNTER — Inpatient Hospital Stay: Payer: Medicaid Other | Admitting: Oncology

## 2022-04-25 ENCOUNTER — Inpatient Hospital Stay: Payer: Medicaid Other

## 2022-04-25 VITALS — BP 140/70 | HR 70 | Temp 98.2°F | Resp 18 | Ht 70.0 in | Wt 226.0 lb

## 2022-04-25 DIAGNOSIS — Z8616 Personal history of COVID-19: Secondary | ICD-10-CM | POA: Diagnosis not present

## 2022-04-25 DIAGNOSIS — C7802 Secondary malignant neoplasm of left lung: Secondary | ICD-10-CM | POA: Diagnosis not present

## 2022-04-25 DIAGNOSIS — Z95828 Presence of other vascular implants and grafts: Secondary | ICD-10-CM

## 2022-04-25 DIAGNOSIS — R970 Elevated carcinoembryonic antigen [CEA]: Secondary | ICD-10-CM | POA: Diagnosis not present

## 2022-04-25 DIAGNOSIS — C7801 Secondary malignant neoplasm of right lung: Secondary | ICD-10-CM | POA: Diagnosis not present

## 2022-04-25 DIAGNOSIS — D709 Neutropenia, unspecified: Secondary | ICD-10-CM | POA: Diagnosis not present

## 2022-04-25 DIAGNOSIS — C787 Secondary malignant neoplasm of liver and intrahepatic bile duct: Secondary | ICD-10-CM

## 2022-04-25 DIAGNOSIS — C187 Malignant neoplasm of sigmoid colon: Secondary | ICD-10-CM

## 2022-04-25 LAB — CMP (CANCER CENTER ONLY)
ALT: 81 U/L — ABNORMAL HIGH (ref 0–44)
AST: 59 U/L — ABNORMAL HIGH (ref 15–41)
Albumin: 4.2 g/dL (ref 3.5–5.0)
Alkaline Phosphatase: 93 U/L (ref 38–126)
Anion gap: 13 (ref 5–15)
BUN: 6 mg/dL (ref 6–20)
CO2: 24 mmol/L (ref 22–32)
Calcium: 8.9 mg/dL (ref 8.9–10.3)
Chloride: 101 mmol/L (ref 98–111)
Creatinine: 0.93 mg/dL (ref 0.61–1.24)
GFR, Estimated: >60 mL/min (ref >60)
Glucose, Bld: 234 mg/dL — ABNORMAL HIGH (ref 70–99)
Potassium: 3.8 mmol/L (ref 3.5–5.1)
Sodium: 138 mmol/L (ref 135–145)
Total Bilirubin: 0.4 mg/dL (ref 0.3–1.2)
Total Protein: 7.4 g/dL (ref 6.5–8.1)

## 2022-04-25 LAB — CBC WITH DIFFERENTIAL (CANCER CENTER ONLY)
Abs Immature Granulocytes: 0.01 10*3/uL (ref 0.00–0.07)
Basophils Absolute: 0 10*3/uL (ref 0.0–0.1)
Basophils Relative: 0 %
Eosinophils Absolute: 0.1 10*3/uL (ref 0.0–0.5)
Eosinophils Relative: 2 %
HCT: 32.1 % — ABNORMAL LOW (ref 39.0–52.0)
Hemoglobin: 10.3 g/dL — ABNORMAL LOW (ref 13.0–17.0)
Immature Granulocytes: 0 %
Lymphocytes Relative: 61 %
Lymphs Abs: 2.1 10*3/uL (ref 0.7–4.0)
MCH: 31.7 pg (ref 26.0–34.0)
MCHC: 32.1 g/dL (ref 30.0–36.0)
MCV: 98.8 fL (ref 80.0–100.0)
Monocytes Absolute: 0.4 10*3/uL (ref 0.1–1.0)
Monocytes Relative: 11 %
Neutro Abs: 0.9 10*3/uL — ABNORMAL LOW (ref 1.7–7.7)
Neutrophils Relative %: 26 %
Platelet Count: 86 10*3/uL — ABNORMAL LOW (ref 150–400)
RBC: 3.25 MIL/uL — ABNORMAL LOW (ref 4.22–5.81)
RDW: 18.4 % — ABNORMAL HIGH (ref 11.5–15.5)
WBC Count: 3.3 10*3/uL — ABNORMAL LOW (ref 4.0–10.5)
nRBC: 0 % (ref 0.0–0.2)

## 2022-04-25 LAB — TOTAL PROTEIN, URINE DIPSTICK: Protein, ur: NEGATIVE mg/dL

## 2022-04-25 LAB — CEA (ACCESS): CEA (CHCC): 150.84 ng/mL — ABNORMAL HIGH (ref 0.00–5.00)

## 2022-04-25 MED ORDER — SODIUM CHLORIDE 0.9% FLUSH
10.0000 mL | INTRAVENOUS | Status: DC | PRN
  Administered 2022-04-25: 10 mL via INTRAVENOUS

## 2022-04-25 MED ORDER — HEPARIN SOD (PORK) LOCK FLUSH 100 UNIT/ML IV SOLN
500.0000 [IU] | Freq: Once | INTRAVENOUS | Status: AC
  Administered 2022-04-25: 500 [IU] via INTRAVENOUS

## 2022-04-25 NOTE — Patient Instructions (Signed)

## 2022-04-25 NOTE — Progress Notes (Signed)
[place] OFFICE PROGRESS NOTE   Diagnosis: Colon cancer  INTERVAL HISTORY:   Jeff [name] returns as scheduled.  He is due to begin another cycle of Lonsurf today.  No dyspnea, bleeding, or symptom of thrombosis.  Good appetite.  No seizure.  Objective:  Vital signs in last 24 hours:  Blood pressure (!) 140/70, pulse 70, temperature 98.2 F (36.8 C), temperature source Oral, resp. rate 18, height _0  (1.778 m), weight 226 lb (102.5 kg), SpO2 100 %.    HEENT: No thrush or ulcers Lymphatics: No cervical, supraclavicular, axillary, or inguinal nodes Resp: Lungs clear bilaterally Cardio: Regular rate and rhythm GI: No hepatosplenomegaly, nontender Vascular: No leg edema   Portacath/PICC-without erythema  Lab Results:  Lab Results  Component Value Date   WBC 3.3 (L) 04/25/2022   HGB 10.3 (L) 04/25/2022   HCT 32.1 (L) 04/25/2022   MCV 98.8 04/25/2022   PLT 86 (L) 04/25/2022   NEUTROABS 0.9 (L) 04/25/2022    CMP  Lab Results  Component Value Date   NA 136 04/11/2022   K 3.7 04/11/2022   CL 98 04/11/2022   CO2 25 04/11/2022   GLUCOSE 202 (H) 04/11/2022   BUN 11 04/11/2022   CREATININE 0.91 04/11/2022   CALCIUM 8.9 04/11/2022   PROT 7.6 04/11/2022   ALBUMIN 4.3 04/11/2022   AST 39 04/11/2022   ALT 56 (H) 04/11/2022   ALKPHOS 82 04/11/2022   BILITOT 0.7 04/11/2022   GFRNONAA >60 04/11/2022   GFRAA >60 01/30/2020    Lab Results  Component Value Date   CEA1 3.19 09/10/2020   CEA 100.59 (H) 04/11/2022    Medications: I have reviewed the patient's current medications.   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, biopsy confirmed adenocarcinoma         CTs chest, abdomen, and pelvis on 12/11/2015-indeterminate tiny pulmonary nodules, multiple liver metastases, small nodes in the sigmoid mesocolon Laparoscopic sigmoid colectomy, liver biopsy, Port-A-Cath placement  01/14/2016 Pathology sigmoid colon resection- colonic adenocarcinoma, 5.3 cm extending into pericolonic connective tissue, positive lymph-vascular invasion, positive perineural invasion, negative margins, metastatic carcinoma in 9 of 28 lymph nodes Pathology liver biopsy-metastatic colorectal adenocarcinoma MSI stable; mismatch repair protein normal APC alteration, K RAS wild-type, no BRAF mutation Cycle 1 FOLFOX 02/02/2016 Cycle 2 FOLFOX 02/15/2016 Cycle 3 FOLFOX 02/29/2016 Cycle 4 FOLFOX 03/14/2016 Cycle 5 FOLFOX 03/28/2016 Cycle 6 FOLFOX 04/11/2016 (oxaliplatin held secondary to thrombocytopenia) 04/21/2016 restaging CTs-difficulty evaluating liver lesions due to hepatic steatosis. Stable right adrenal nodule. No adenopathy or local recurrence near the rectosigmoid anastomotic site. Cycle 7 FOLFOX 04/25/2016 MRI liver 05/02/2016-partial improvement in hepatic metastases Cycle 8 FOLFOX 05/10/2016 Cycle 9 FOLFOX 05/23/2016 (oxaliplatin held due to thrombocytopenia) Cycle 10 FOLFOX 06/06/2016 (oxaliplatin dose reduced due to thrombocytopenia) Cycle 11 FOLFOX 06/27/2016 (oxaliplatin held due to neuropathy) Cycle 12 FOLFOX 07/11/2016 (oxaliplatin held) Initiation of maintenance Xeloda 7 days on/7 days off 07/27/2016 MRI liver 11/18/2016-decrease in hepatic metastatic disease. No new or progressive disease identified within the abdomen. Continuation of Xeloda 7 days on/7 days off MRI liver 04/27/2017-previous liver lesions not identified, no new lesions, no lymphadenopathy Xeloda continued 7 days on/7 days off MRI liver 12/04/2017 - no evidence of metastatic disease, hepatic steatosis Xeloda continued 7 days on/7 days off MRI liver 07/15/2018- no evidence of metastatic disease.  Stable severe hepatic steatosis. Xeloda continued 7 days on/7 days off MRI liver 03/16/2019-hepatic steatosis, no liver mass, focal area of intrahepatic biliary  dilatation in segments 2 and 3 of the left  lobe-increased Xeloda continued 7 days on/7 days off MRI abdomen 08/19/2019-no findings to suggest liver metastases.  Bilateral lung nodules measuring up to 2.8 cm, progressive and more conspicuous than on previous exam CT chest 08/29/2019-multiple pulmonary metastases, new from 04/21/2016 Cycle 1 FOLFIRI/bevacizumab 09/09/2019 Cycle 2 FOLFIRI/bevacizumab 09/26/2019  Cycle 3 FOLFIRI/bevacizumab 10/10/2019 Cycle 4 FOLFIRI/bevacizumab 10/24/2019 Cycle 5 FOLFIRI/bevacizumab 11/07/2019 CT chest 11/14/2019-decreased size of lung nodules, no new lesions, hepatic steatosis Cycle 6 FOLFIRI/bevacizumab 11/21/2019 Cycle 7 FOLFIRI/bevacizumab 12/05/2019 Cycle 8 FOLFIRI/bevacizumab 12/19/2019 Cycle 9 FOLFIRI/bevacizumab 01/02/2020 Cycle 10 FOLFIRI/bevacizumab 01/16/2020 CT chest 01/29/2020-stable bilateral pulmonary metastases.  No new or progressive metastatic disease in the chest. Cycle 11 FOLFIRI/bevacizumab 01/30/2020 Cycle 12 FOLFIRI/bevacizumab 02/19/2020 Cycle 13 FOLFIRI/bevacizumab 03/12/2020 Cycle 14 FOLFIRI/bevacizumab 04/02/2020 Cycle 15 FOLFIRI/bevacizumab 04/23/2020 Cycle 16 FOLFIRI/bevacizumab 05/21/2020 CT chest 06/09/2020-mild progression pulmonary metastasis.  Some lesions have increased in size while others are similar. Cycle 1 irinotecan/Panitumumab 06/18/2020 Cycle 2 irinotecan/Panitumumab 07/02/2020 Cycle 3 irinotecan/panitumumab 07/16/2020 Cycle 4 irinotecan/Panitumumab 07/30/2020 Cycle 5 irinotecan/Panitumumab 08/13/2020, Emend added Cycle 6 irinotecan/Panitumumab 08/27/2020 CT chest 09/08/2020-decreased size of pulmonary nodules, no progressive disease Cycle 7 irinotecan/panitumumab 09/02/2020 Cycle 8 irinotecan/panitumumab 09/24/2020 Cycle 9 irinotecan/Panitumumab 10/08/2020 10/22/2020 treatment held due to left foot fracture, need for surgery Cycle 10 irinotecan/panitumumab 11/24/2020 Cycle 11 irinotecan/Panitumumab 12/08/2020 Cycle 12 irinotecan/panitumumab 12/22/2020 Cycle 13 irinotecan/panitumumab  01/05/2021 01/20/2021 CT chest-mixed response with minimal increase in size of some lesions and minimal decrease in the size of other lesions.  Overall number of lesions is unchanged. Cycle 14 irinotecan/Panitumumab 02/05/2021 Cycle 15 irinotecan/Panitumumab 02/23/2021 Cycle 16 irinotecan/panitumumab 03/16/2021 Cycle 17 irinotecan/Panitumumab 04/13/2021 05/03/2021-CT chest-enlargement of pulmonary metastases, no new lesions 06/21/2021-cycle 1 Lonsurf/bevacizumab 07/19/2021-cycle 2 Lonsurf/bevacizumab 08/16/2021-cycle 3 Lonsurf/bevacizumab 09/09/2021 CT chest-bilateral lung nodules and masses with mixed response, overall stable to very minimally increased 09/13/2021 cycle 4 Lonsurf/bevacizumab 10/12/2021 cycle 5 Lonsurf/bevacizumab 10/21/2021 chest CT-bilateral pulmonary metastases without significant change.  No new or progressive disease within the chest. 11/08/2021 cycle 6 Lonsurf/bevacizumab 12/06/2021 cycle 7 Lonsurf/bevacizumab 01/03/2022 cycle 8 Lonsurf/bevacizumab CTs 01/25/2022-index lung lesion stable to mildly increased in size.  Mild retroperitoneal adenopathy, mildly increased in size compared to 12/16/2020. 02/01/2022 cycle 9 Lonsurf/bevacizumab 02/28/2022 cycle 10 Lonsurf/bevacizumab 03/28/2022 cycle 11 Lonsurf/bevacizumab CTs 04/21/2022-no change in bilateral pulmonary metastases, 2 new hypoattenuating liver lesions, new enlarged portacaval node   2.   Rectal bleeding and constipation secondary to #1   3.   History of peripheral vascular disease, status post left lower extremity vascular bypass surgery in April 2017   4.   History of nephrolithiasis   5.   History of Graves' disease treated with radioactive iodine   6.   Anxiety/depression   7.   Hypertension   8.   Hospitalization 01/19/2016 with wound dehiscence status post secondary suture closure of abdominal wall   9.   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 Hyperglycemia  06/20/2016-diagnosed with diabetes, maintained on insulin   11.  Positive COVID test 12/13/2018; positive COVID test 01/25/2021   12. Hospitalized with seizure activity/DKA. Now on Keppra, insulin adjusted. 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  Left foot fracture-surgical repair 11/11/2020     Disposition: Jeff [name] appears unchanged.  I reviewed the restaging CT findings and images with Jeff [name] and his wife.  The CEA is higher.  The restaging CTs reveal evidence of disease progression in the liver.  At least one of the liver lesions appears new by my review.  I will present his case at the GI tumor conference for further evaluation of the CT images.  He would like to have multiple dental extractions and then have a fitting for dentures.  He last received Avastin on 04/11/2022.  He will try to schedule the dental extractions for approximately 2 weeks from now.  He has neutropenia today.  He will return for a CBC in 1 week.  Avastin and Lonsurf will be placed on hold.  We discussed future treatment options including fruquintinib.  Jeff [name] will return for an office visit in 2 weeks.  Betsy Coder, MD  04/25/2022  10:39 AM

## 2022-04-25 NOTE — Telephone Encounter (Signed)
On HOld

## 2022-04-25 NOTE — Progress Notes (Signed)
Faxed clearance orders to [facility] at [phone]. Should not have extractions until after 05/09/2022. Per patient, he has been scheduled for 05/17/22.

## 2022-04-27 ENCOUNTER — Other Ambulatory Visit (HOSPITAL_COMMUNITY): Payer: Self-pay

## 2022-04-29 ENCOUNTER — Other Ambulatory Visit (HOSPITAL_COMMUNITY): Payer: Self-pay

## 2022-05-03 ENCOUNTER — Inpatient Hospital Stay: Payer: Medicaid Other

## 2022-05-03 DIAGNOSIS — C187 Malignant neoplasm of sigmoid colon: Secondary | ICD-10-CM | POA: Diagnosis not present

## 2022-05-03 LAB — CBC WITH DIFFERENTIAL (CANCER CENTER ONLY)
Abs Immature Granulocytes: 0.01 10*3/uL (ref 0.00–0.07)
Basophils Absolute: 0 10*3/uL (ref 0.0–0.1)
Basophils Relative: 0 %
Eosinophils Absolute: 0.1 10*3/uL (ref 0.0–0.5)
Eosinophils Relative: 1 %
HCT: 34.9 % — ABNORMAL LOW (ref 39.0–52.0)
Hemoglobin: 10.8 g/dL — ABNORMAL LOW (ref 13.0–17.0)
Immature Granulocytes: 0 %
Lymphocytes Relative: 46 %
Lymphs Abs: 2 10*3/uL (ref 0.7–4.0)
MCH: 30.9 pg (ref 26.0–34.0)
MCHC: 30.9 g/dL (ref 30.0–36.0)
MCV: 100 fL (ref 80.0–100.0)
Monocytes Absolute: 0.5 10*3/uL (ref 0.1–1.0)
Monocytes Relative: 11 %
Neutro Abs: 1.8 10*3/uL (ref 1.7–7.7)
Neutrophils Relative %: 42 %
Platelet Count: 94 10*3/uL — ABNORMAL LOW (ref 150–400)
RBC: 3.49 MIL/uL — ABNORMAL LOW (ref 4.22–5.81)
RDW: 18.1 % — ABNORMAL HIGH (ref 11.5–15.5)
WBC Count: 4.3 10*3/uL (ref 4.0–10.5)
nRBC: 0 % (ref 0.0–0.2)

## 2022-05-04 ENCOUNTER — Other Ambulatory Visit: Payer: Self-pay

## 2022-05-04 ENCOUNTER — Other Ambulatory Visit: Payer: Self-pay | Admitting: Nurse Practitioner

## 2022-05-04 DIAGNOSIS — C189 Malignant neoplasm of colon, unspecified: Secondary | ICD-10-CM

## 2022-05-04 NOTE — Telephone Encounter (Signed)
Received refill request for lorazepam 0.5 mg and clonazepam 1 mg (ordered by Dr. Bobbye Riggs). Confirmed w/wife that he only takes the lorazepam now. Confirmed w/pharmacy that last fill on the clonazepam was 12/15/21. Informed them to D/C the clonazepam and he will only be on the lorazepam.

## 2022-05-04 NOTE — Progress Notes (Signed)
The proposed treatment discussed in conference is for discussion purpose only and is not a binding recommendation.  The patients have not been physically examined, or presented with their treatment options.  Therefore, final treatment plans cannot be decided.  

## 2022-05-05 ENCOUNTER — Other Ambulatory Visit: Payer: Self-pay

## 2022-05-10 ENCOUNTER — Other Ambulatory Visit: Payer: Self-pay | Admitting: *Deleted

## 2022-05-10 ENCOUNTER — Telehealth: Payer: Self-pay | Admitting: Oncology

## 2022-05-10 ENCOUNTER — Inpatient Hospital Stay: Payer: Medicaid Other

## 2022-05-10 ENCOUNTER — Encounter: Payer: Self-pay | Admitting: Oncology

## 2022-05-10 ENCOUNTER — Other Ambulatory Visit (HOSPITAL_COMMUNITY): Payer: Self-pay

## 2022-05-10 ENCOUNTER — Telehealth: Payer: Self-pay | Admitting: Pharmacist

## 2022-05-10 ENCOUNTER — Inpatient Hospital Stay: Payer: Medicaid Other | Admitting: Oncology

## 2022-05-10 VITALS — BP 150/80 | HR 83 | Temp 98.1°F | Resp 18 | Ht 70.0 in | Wt 221.8 lb

## 2022-05-10 DIAGNOSIS — C187 Malignant neoplasm of sigmoid colon: Secondary | ICD-10-CM

## 2022-05-10 DIAGNOSIS — Z95828 Presence of other vascular implants and grafts: Secondary | ICD-10-CM

## 2022-05-10 LAB — CBC WITH DIFFERENTIAL (CANCER CENTER ONLY)
Abs Immature Granulocytes: 0.05 10*3/uL (ref 0.00–0.07)
Basophils Absolute: 0 10*3/uL (ref 0.0–0.1)
Basophils Relative: 0 %
Eosinophils Absolute: 0 10*3/uL (ref 0.0–0.5)
Eosinophils Relative: 1 %
HCT: 35.1 % — ABNORMAL LOW (ref 39.0–52.0)
Hemoglobin: 11 g/dL — ABNORMAL LOW (ref 13.0–17.0)
Immature Granulocytes: 1 %
Lymphocytes Relative: 30 %
Lymphs Abs: 2.4 10*3/uL (ref 0.7–4.0)
MCH: 31.4 pg (ref 26.0–34.0)
MCHC: 31.3 g/dL (ref 30.0–36.0)
MCV: 100.3 fL — ABNORMAL HIGH (ref 80.0–100.0)
Monocytes Absolute: 0.8 10*3/uL (ref 0.1–1.0)
Monocytes Relative: 10 %
Neutro Abs: 4.8 10*3/uL (ref 1.7–7.7)
Neutrophils Relative %: 58 %
Platelet Count: 144 10*3/uL — ABNORMAL LOW (ref 150–400)
RBC: 3.5 MIL/uL — ABNORMAL LOW (ref 4.22–5.81)
RDW: 17.6 % — ABNORMAL HIGH (ref 11.5–15.5)
WBC Count: 8.2 10*3/uL (ref 4.0–10.5)
nRBC: 0 % (ref 0.0–0.2)

## 2022-05-10 LAB — CMP (CANCER CENTER ONLY)
ALT: 29 U/L (ref 0–44)
AST: 24 U/L (ref 15–41)
Albumin: 4.2 g/dL (ref 3.5–5.0)
Alkaline Phosphatase: 96 U/L (ref 38–126)
Anion gap: 8 (ref 5–15)
BUN: 14 mg/dL (ref 6–20)
CO2: 26 mmol/L (ref 22–32)
Calcium: 9.1 mg/dL (ref 8.9–10.3)
Chloride: 103 mmol/L (ref 98–111)
Creatinine: 0.94 mg/dL (ref 0.61–1.24)
GFR, Estimated: >60 mL/min (ref >60)
Glucose, Bld: 289 mg/dL — ABNORMAL HIGH (ref 70–99)
Potassium: 3.7 mmol/L (ref 3.5–5.1)
Sodium: 137 mmol/L (ref 135–145)
Total Bilirubin: 0.4 mg/dL (ref 0.3–1.2)
Total Protein: 7.7 g/dL (ref 6.5–8.1)

## 2022-05-10 LAB — CEA (ACCESS): CEA (CHCC): 156.21 ng/mL — ABNORMAL HIGH (ref 0.00–5.00)

## 2022-05-10 MED ORDER — FRUQUINTINIB 5 MG PO CAPS
1.0000 | ORAL_CAPSULE | Freq: Every day | ORAL | 0 refills | Status: DC
  Filled 2022-05-10: qty 21, fill #0
  Filled 2022-05-20: qty 21, 21d supply, fill #0

## 2022-05-10 MED ORDER — FRUQUINTINIB 5 MG PO CAPS
5.0000 mg | ORAL_CAPSULE | Freq: Every day | ORAL | 0 refills | Status: DC
  Filled 2022-05-10: qty 21, fill #0

## 2022-05-10 MED ORDER — SODIUM CHLORIDE 0.9% FLUSH
10.0000 mL | INTRAVENOUS | Status: DC | PRN
  Administered 2022-05-10: 10 mL via INTRAVENOUS

## 2022-05-10 MED ORDER — HEPARIN SOD (PORK) LOCK FLUSH 100 UNIT/ML IV SOLN
500.0000 [IU] | Freq: Once | INTRAVENOUS | Status: AC
  Administered 2022-05-10: 500 [IU] via INTRAVENOUS

## 2022-05-10 NOTE — Progress Notes (Signed)
[name] OFFICE PROGRESS NOTE   Diagnosis: Colon cancer  INTERVAL HISTORY:   Jeff [name] returns as scheduled.  No new complaint.  He is scheduled for multiple tooth extractions in 1 week.  Objective:  Vital signs in last 24 hours:  Blood pressure (!) 150/80, pulse 83, temperature 98.1 F (36.7 C), temperature source Oral, resp. rate 18, height _0  (1.778 m), weight 221 lb 12.8 oz (100.6 kg), SpO2 100 %.    Resp: Clear bilaterally Cardio: Regular rate and rhythm GI: No hepatosplenomegaly, nontender Vascular: No leg edema    Portacath/PICC-without erythema  Lab Results:  Lab Results  Component Value Date   WBC 8.2 05/10/2022   HGB 11.0 (L) 05/10/2022   HCT 35.1 (L) 05/10/2022   MCV 100.3 (H) 05/10/2022   PLT 144 (L) 05/10/2022   NEUTROABS 4.8 05/10/2022    CMP  Lab Results  Component Value Date   NA 138 04/25/2022   K 3.8 04/25/2022   CL 101 04/25/2022   CO2 24 04/25/2022   GLUCOSE 234 (H) 04/25/2022   BUN 6 04/25/2022   CREATININE 0.93 04/25/2022   CALCIUM 8.9 04/25/2022   PROT 7.4 04/25/2022   ALBUMIN 4.2 04/25/2022   AST 59 (H) 04/25/2022   ALT 81 (H) 04/25/2022   ALKPHOS 93 04/25/2022   BILITOT 0.4 04/25/2022   GFRNONAA >60 04/25/2022   GFRAA >60 01/30/2020    Lab Results  Component Value Date   CEA1 3.19 09/10/2020   CEA 150.84 (H) 04/25/2022    Lab Results  Component Value Date   INR 0.98 01/19/2016   LABPROT 13.0 01/19/2016    Imaging:  No results found.  Medications: I have reviewed the patient's current medications.   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, biopsy confirmed adenocarcinoma         CTs chest, abdomen, and pelvis on 12/11/2015-indeterminate tiny pulmonary nodules, multiple liver metastases, small nodes in the sigmoid mesocolon Laparoscopic sigmoid colectomy, liver biopsy, Port-A-Cath placement 01/14/2016 Pathology sigmoid  colon resection- colonic adenocarcinoma, 5.3 cm extending into pericolonic connective tissue, positive lymph-vascular invasion, positive perineural invasion, negative margins, metastatic carcinoma in 9 of 28 lymph nodes Pathology liver biopsy-metastatic colorectal adenocarcinoma MSI stable; mismatch repair protein normal APC alteration, K RAS wild-type, no BRAF mutation Cycle 1 FOLFOX 02/02/2016 Cycle 2 FOLFOX 02/15/2016 Cycle 3 FOLFOX 02/29/2016 Cycle 4 FOLFOX 03/14/2016 Cycle 5 FOLFOX 03/28/2016 Cycle 6 FOLFOX 04/11/2016 (oxaliplatin held secondary to thrombocytopenia) 04/21/2016 restaging CTs-difficulty evaluating liver lesions due to hepatic steatosis. Stable right adrenal nodule. No adenopathy or local recurrence near the rectosigmoid anastomotic site. Cycle 7 FOLFOX 04/25/2016 MRI liver 05/02/2016-partial improvement in hepatic metastases Cycle 8 FOLFOX 05/10/2016 Cycle 9 FOLFOX 05/23/2016 (oxaliplatin held due to thrombocytopenia) Cycle 10 FOLFOX 06/06/2016 (oxaliplatin dose reduced due to thrombocytopenia) Cycle 11 FOLFOX 06/27/2016 (oxaliplatin held due to neuropathy) Cycle 12 FOLFOX 07/11/2016 (oxaliplatin held) Initiation of maintenance Xeloda 7 days on/7 days off 07/27/2016 MRI liver 11/18/2016-decrease in hepatic metastatic disease. No new or progressive disease identified within the abdomen. Continuation of Xeloda 7 days on/7 days off MRI liver 04/27/2017-previous liver lesions not identified, no new lesions, no lymphadenopathy Xeloda continued 7 days on/7 days off MRI liver 12/04/2017 - no evidence of metastatic disease, hepatic steatosis Xeloda continued 7 days on/7 days off MRI liver 07/15/2018- no evidence of metastatic disease.  Stable severe hepatic steatosis. Xeloda continued 7 days on/7 days off MRI liver 03/16/2019-hepatic steatosis, no liver mass,  focal area of intrahepatic biliary dilatation in segments 2 and 3 of the left lobe-increased Xeloda continued 7 days on/7  days off MRI abdomen 08/19/2019-no findings to suggest liver metastases.  Bilateral lung nodules measuring up to 2.8 cm, progressive and more conspicuous than on previous exam CT chest 08/29/2019-multiple pulmonary metastases, new from 04/21/2016 Cycle 1 FOLFIRI/bevacizumab 09/09/2019 Cycle 2 FOLFIRI/bevacizumab 09/26/2019  Cycle 3 FOLFIRI/bevacizumab 10/10/2019 Cycle 4 FOLFIRI/bevacizumab 10/24/2019 Cycle 5 FOLFIRI/bevacizumab 11/07/2019 CT chest 11/14/2019-decreased size of lung nodules, no new lesions, hepatic steatosis Cycle 6 FOLFIRI/bevacizumab 11/21/2019 Cycle 7 FOLFIRI/bevacizumab 12/05/2019 Cycle 8 FOLFIRI/bevacizumab 12/19/2019 Cycle 9 FOLFIRI/bevacizumab 01/02/2020 Cycle 10 FOLFIRI/bevacizumab 01/16/2020 CT chest 01/29/2020-stable bilateral pulmonary metastases.  No new or progressive metastatic disease in the chest. Cycle 11 FOLFIRI/bevacizumab 01/30/2020 Cycle 12 FOLFIRI/bevacizumab 02/19/2020 Cycle 13 FOLFIRI/bevacizumab 03/12/2020 Cycle 14 FOLFIRI/bevacizumab 04/02/2020 Cycle 15 FOLFIRI/bevacizumab 04/23/2020 Cycle 16 FOLFIRI/bevacizumab 05/21/2020 CT chest 06/09/2020-mild progression pulmonary metastasis.  Some lesions have increased in size while others are similar. Cycle 1 irinotecan/Panitumumab 06/18/2020 Cycle 2 irinotecan/Panitumumab 07/02/2020 Cycle 3 irinotecan/panitumumab 07/16/2020 Cycle 4 irinotecan/Panitumumab 07/30/2020 Cycle 5 irinotecan/Panitumumab 08/13/2020, Emend added Cycle 6 irinotecan/Panitumumab 08/27/2020 CT chest 09/08/2020-decreased size of pulmonary nodules, no progressive disease Cycle 7 irinotecan/panitumumab 09/02/2020 Cycle 8 irinotecan/panitumumab 09/24/2020 Cycle 9 irinotecan/Panitumumab 10/08/2020 10/22/2020 treatment held due to left foot fracture, need for surgery Cycle 10 irinotecan/panitumumab 11/24/2020 Cycle 11 irinotecan/Panitumumab 12/08/2020 Cycle 12 irinotecan/panitumumab 12/22/2020 Cycle 13 irinotecan/panitumumab 01/05/2021 01/20/2021 CT chest-mixed response with  minimal increase in size of some lesions and minimal decrease in the size of other lesions.  Overall number of lesions is unchanged. Cycle 14 irinotecan/Panitumumab 02/05/2021 Cycle 15 irinotecan/Panitumumab 02/23/2021 Cycle 16 irinotecan/panitumumab 03/16/2021 Cycle 17 irinotecan/Panitumumab 04/13/2021 05/03/2021-CT chest-enlargement of pulmonary metastases, no new lesions 06/21/2021-cycle 1 Lonsurf/bevacizumab 07/19/2021-cycle 2 Lonsurf/bevacizumab 08/16/2021-cycle 3 Lonsurf/bevacizumab 09/09/2021 CT chest-bilateral lung nodules and masses with mixed response, overall stable to very minimally increased 09/13/2021 cycle 4 Lonsurf/bevacizumab 10/12/2021 cycle 5 Lonsurf/bevacizumab 10/21/2021 chest CT-bilateral pulmonary metastases without significant change.  No new or progressive disease within the chest. 11/08/2021 cycle 6 Lonsurf/bevacizumab 12/06/2021 cycle 7 Lonsurf/bevacizumab 01/03/2022 cycle 8 Lonsurf/bevacizumab CTs 01/25/2022-index lung lesion stable to mildly increased in size.  Mild retroperitoneal adenopathy, mildly increased in size compared to 12/16/2020. 02/01/2022 cycle 9 Lonsurf/bevacizumab 02/28/2022 cycle 10 Lonsurf/bevacizumab 03/28/2022 cycle 11 Lonsurf/bevacizumab CTs 04/21/2022-no change in bilateral pulmonary metastases, 2 new hypoattenuating liver lesions, new enlarged portacaval node   2.   Rectal bleeding and constipation secondary to #1   3.   History of peripheral vascular disease, status post left lower extremity vascular bypass surgery in April 2017   4.   History of nephrolithiasis   5.   History of Graves' disease treated with radioactive iodine   6.   Anxiety/depression   7.   Hypertension   8.   Hospitalization 01/19/2016 with wound dehiscence status post secondary suture closure of abdominal wall   9.   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 Hyperglycemia 06/20/2016-diagnosed with diabetes, maintained on  insulin   11.  Positive COVID test 12/13/2018; positive COVID test 01/25/2021   12. Hospitalized with seizure activity/DKA. Now on Keppra, insulin adjusted. 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  Left foot fracture-surgical repair 11/11/2020     Disposition: Jeff. [name] has metastatic colon cancer.  The restaging CTs are consistent with disease progression.  The CEA is higher.  He appears well.  We discussed treatment options including fruquintinib recycled FOLFOX, and clinical trials.  He saw Dr. Reynaldo Minium earlier this year.  I will contact Dr. Reynaldo Minium to investigate clinical trial options.  The plan is to begin fruquintinib if he is not eligible for clinical trial.  We reviewed potential toxicities associated with this agent including the chance of nausea, diarrhea, rash, hypertension, bleeding, and thromboembolic disease.  He agrees to proceed.  The plan is to begin fruquintinib on 05/24/2022.  He is scheduled for dental extractions on 05/17/2022.  Betsy Coder, MD  05/10/2022  11:09 AM

## 2022-05-10 NOTE — Telephone Encounter (Signed)
Called patient to notify of change in appointments. Left voicemail with new appointment information.

## 2022-05-10 NOTE — Telephone Encounter (Signed)
Oral Oncology Pharmacist Encounter  Received new prescription for Fruzaqla (fruquintinib) for the treatment of metastatic colon cancer, planned duration until disease progression or unacceptable drug toxicity. Planned start 05/24/22.  CMP from 05/10/22 assessed, no relevant lab abnormalities. Prescription dose and frequency assessed.   Current medication list in Epic reviewed, no DDIs with fruquintinib identified.  Evaluated chart and no patient barriers to medication adherence identified.   Prescription has been e-scribed to the [facility] for benefits analysis and approval.  Oral Oncology Clinic will continue to follow for insurance authorization, copayment issues, initial counseling and start date.  Patient agreed to treatment on 05/10/22 per MD documentation.  Darl Pikes, PharmD, BCPS, BCOP, CPP Hematology/Oncology Clinical Pharmacist Practitioner [place]/DB/AP Oral [place] [facility] [phone]  05/10/2022 2:44 PM

## 2022-05-10 NOTE — Progress Notes (Signed)
Sent script for new start of Friqiomtinib to [facility] for start on 05/24/22 if OK. Having several teeth extractions on 05/17/22.

## 2022-05-11 ENCOUNTER — Telehealth: Payer: Self-pay

## 2022-05-11 ENCOUNTER — Other Ambulatory Visit (HOSPITAL_COMMUNITY): Payer: Self-pay

## 2022-05-11 NOTE — Telephone Encounter (Signed)
Oral Oncology Patient Advocate Encounter   Received notification that prior authorization for Jeff [name] is required.   PA submitted on 05/11/22  Key [id number]  Status is pending     Jeff [name], [name] Patient [place]  [phone] (phone) [phone] (fax) 05/11/2022 12:27 PM

## 2022-05-11 NOTE — Telephone Encounter (Signed)
Oral Oncology Patient Advocate Encounter  Prior Authorization for Jeff [name] has been approved.    PA#  [id number]  Effective dates: 05/11/22 through 05/12/23  Patients co-pay is $4.    Jeff [name], [name]  [phone] (phone) [phone] (fax) 05/11/2022 1:29 PM

## 2022-05-13 ENCOUNTER — Other Ambulatory Visit (HOSPITAL_COMMUNITY): Payer: Self-pay

## 2022-05-18 ENCOUNTER — Other Ambulatory Visit (HOSPITAL_COMMUNITY): Payer: Self-pay

## 2022-05-19 ENCOUNTER — Telehealth: Payer: Self-pay

## 2022-05-19 NOTE — Telephone Encounter (Addendum)
Called and left VM for patient to call back to initiate OnBoarding and to set up first fill.   Attempt 1 - 05/18/22 '@2pm'$  Attempt 2 - 05/19/22 '@1'$ :30pm Attempt 3 - 05/19/22 '@3'$ :50pm   [name], [place] Patient Jeff [name]  [phone] (phone) [phone] (fax) 05/19/2022 1:45 PM

## 2022-05-20 ENCOUNTER — Encounter: Payer: Self-pay | Admitting: Oncology

## 2022-05-20 ENCOUNTER — Other Ambulatory Visit: Payer: Self-pay

## 2022-05-20 ENCOUNTER — Other Ambulatory Visit (HOSPITAL_COMMUNITY): Payer: Self-pay

## 2022-05-20 ENCOUNTER — Telehealth: Payer: Self-pay | Admitting: *Deleted

## 2022-05-20 NOTE — Telephone Encounter (Signed)
Oral Chemotherapy Pharmacist Encounter  Jeff [name] wil pick up medication from [facility] on Monday 05/23/22.  Patient Education I spoke with patient for overview of new oral chemotherapy medication: Jeff [name] (fruquintinib) for the treatment of metastatic colon cancer, planned duration until disease progression or unacceptable drug toxicity. Planned start 05/24/22.   Pt is doing well. Counseled patient on administration, dosing, side effects, monitoring, drug-food interactions, safe handling, storage, and disposal. Patient will take 1 tablet by mouth daily. Take for 21 days, then hold for 7 days. Repeat every 28 days.   Side effects include but not limited to: hypertension, diarrhea, fatigue. Hypertension: patient has blood pressure cuff at home and will keep a log of his home readings    Reviewed with patient importance of keeping a medication schedule and plan for any missed doses.  After discussion with patient no patient barriers to medication adherence identified.   Jeff [name] voiced understanding and appreciation. All questions answered.   Provided patient with [facility] phone number. Patient knows to call the office with questions or concerns. Oral Chemotherapy Navigation Clinic will continue to follow.  Darl Pikes, PharmD, BCPS, BCOP, CPP Hematology/Oncology Clinical Pharmacist Practitioner [place]/DB/AP Oral [place] [facility] [phone]  05/20/2022 1:44 PM

## 2022-05-20 NOTE — Telephone Encounter (Signed)
Left VM for patient/wife to please call Marland Kitchen with oral oncology team re: Fruquintinib start. Provide phone # to call.

## 2022-05-21 ENCOUNTER — Other Ambulatory Visit (HOSPITAL_COMMUNITY): Payer: Self-pay

## 2022-05-23 ENCOUNTER — Other Ambulatory Visit: Payer: Self-pay

## 2022-05-24 ENCOUNTER — Other Ambulatory Visit (HOSPITAL_COMMUNITY): Payer: Self-pay

## 2022-06-02 ENCOUNTER — Inpatient Hospital Stay: Payer: Medicaid Other

## 2022-06-02 ENCOUNTER — Other Ambulatory Visit: Payer: Medicaid Other

## 2022-06-03 ENCOUNTER — Inpatient Hospital Stay: Payer: Medicaid Other

## 2022-06-03 ENCOUNTER — Inpatient Hospital Stay: Payer: Medicaid Other | Admitting: Oncology

## 2022-06-06 ENCOUNTER — Inpatient Hospital Stay: Payer: Medicaid Other

## 2022-06-06 ENCOUNTER — Inpatient Hospital Stay: Payer: Medicaid Other | Admitting: Nurse Practitioner

## 2022-06-06 ENCOUNTER — Encounter: Payer: Self-pay | Admitting: Nurse Practitioner

## 2022-06-06 ENCOUNTER — Inpatient Hospital Stay: Payer: Medicaid Other | Attending: Oncology

## 2022-06-06 VITALS — BP 130/84 | HR 100 | Temp 98.2°F | Resp 18 | Ht 70.0 in | Wt 214.8 lb

## 2022-06-06 DIAGNOSIS — C7801 Secondary malignant neoplasm of right lung: Secondary | ICD-10-CM | POA: Insufficient documentation

## 2022-06-06 DIAGNOSIS — Z794 Long term (current) use of insulin: Secondary | ICD-10-CM | POA: Diagnosis not present

## 2022-06-06 DIAGNOSIS — I10 Essential (primary) hypertension: Secondary | ICD-10-CM | POA: Insufficient documentation

## 2022-06-06 DIAGNOSIS — C787 Secondary malignant neoplasm of liver and intrahepatic bile duct: Secondary | ICD-10-CM | POA: Diagnosis not present

## 2022-06-06 DIAGNOSIS — C187 Malignant neoplasm of sigmoid colon: Secondary | ICD-10-CM | POA: Diagnosis present

## 2022-06-06 DIAGNOSIS — Z8616 Personal history of COVID-19: Secondary | ICD-10-CM | POA: Insufficient documentation

## 2022-06-06 DIAGNOSIS — E1159 Type 2 diabetes mellitus with other circulatory complications: Secondary | ICD-10-CM | POA: Insufficient documentation

## 2022-06-06 DIAGNOSIS — C7802 Secondary malignant neoplasm of left lung: Secondary | ICD-10-CM | POA: Diagnosis not present

## 2022-06-06 DIAGNOSIS — C189 Malignant neoplasm of colon, unspecified: Secondary | ICD-10-CM | POA: Diagnosis not present

## 2022-06-06 LAB — CBC WITH DIFFERENTIAL (CANCER CENTER ONLY)
Abs Immature Granulocytes: 0.02 10*3/uL (ref 0.00–0.07)
Basophils Absolute: 0 10*3/uL (ref 0.0–0.1)
Basophils Relative: 0 %
Eosinophils Absolute: 0.1 10*3/uL (ref 0.0–0.5)
Eosinophils Relative: 1 %
HCT: 40.9 % (ref 39.0–52.0)
Hemoglobin: 13 g/dL (ref 13.0–17.0)
Immature Granulocytes: 0 %
Lymphocytes Relative: 37 %
Lymphs Abs: 3.1 10*3/uL (ref 0.7–4.0)
MCH: 30.4 pg (ref 26.0–34.0)
MCHC: 31.8 g/dL (ref 30.0–36.0)
MCV: 95.6 fL (ref 80.0–100.0)
Monocytes Absolute: 0.7 10*3/uL (ref 0.1–1.0)
Monocytes Relative: 8 %
Neutro Abs: 4.5 10*3/uL (ref 1.7–7.7)
Neutrophils Relative %: 54 %
Platelet Count: 167 10*3/uL (ref 150–400)
RBC: 4.28 MIL/uL (ref 4.22–5.81)
RDW: 14.9 % (ref 11.5–15.5)
WBC Count: 8.4 10*3/uL (ref 4.0–10.5)
nRBC: 0 % (ref 0.0–0.2)

## 2022-06-06 LAB — CMP (CANCER CENTER ONLY)
ALT: 34 U/L (ref 0–44)
AST: 31 U/L (ref 15–41)
Albumin: 4.2 g/dL (ref 3.5–5.0)
Alkaline Phosphatase: 88 U/L (ref 38–126)
Anion gap: 12 (ref 5–15)
BUN: 9 mg/dL (ref 6–20)
CO2: 29 mmol/L (ref 22–32)
Calcium: 10.1 mg/dL (ref 8.9–10.3)
Chloride: 96 mmol/L — ABNORMAL LOW (ref 98–111)
Creatinine: 0.99 mg/dL (ref 0.61–1.24)
GFR, Estimated: >60 mL/min (ref >60)
Glucose, Bld: 176 mg/dL — ABNORMAL HIGH (ref 70–99)
Potassium: 3.4 mmol/L — ABNORMAL LOW (ref 3.5–5.1)
Sodium: 137 mmol/L (ref 135–145)
Total Bilirubin: 0.5 mg/dL (ref 0.3–1.2)
Total Protein: 8.5 g/dL — ABNORMAL HIGH (ref 6.5–8.1)

## 2022-06-06 LAB — CEA (ACCESS): CEA (CHCC): 67.78 ng/mL — ABNORMAL HIGH (ref 0.00–5.00)

## 2022-06-06 LAB — MAGNESIUM: Magnesium: 1.8 mg/dL (ref 1.7–2.4)

## 2022-06-06 MED ORDER — DIPHENOXYLATE-ATROPINE 2.5-0.025 MG PO TABS: 1.0000 | ORAL_TABLET | Freq: Four times a day (QID) | ORAL | 0 refills | Status: DC | PRN

## 2022-06-06 NOTE — Progress Notes (Signed)
[facility] OFFICE PROGRESS NOTE   Diagnosis: Colon cancer  INTERVAL HISTORY:   Jeff [name] returns as scheduled.  He began cycle 1 fruquintinib 05/24/2022.  He denies nausea/vomiting.  No mouth sores.  No diarrhea.  No rash.  No hand or foot pain or redness.  No abdominal pain.  No bleeding.  He had multiple teeth extracted about a week before he began fruquintinib.  Objective:  Vital signs in last 24 hours:  Blood pressure 130/84, pulse 100, temperature 98.2 F (36.8 C), temperature source Oral, resp. rate 18, height '5\' 10"'$  (1.778 m), weight 214 lb 12.8 oz (97.4 kg), SpO2 99 %.    HEENT: Multiple tooth extractions.  No bleeding.  Some extraction sites appear open.  Others healed. Resp: Lungs clear bilaterally. Cardio: Regular rate and rhythm. GI: Abdomen soft and nontender.  No hepatomegaly. Vascular: No leg edema. Skin: Palms without erythema. Port-A-Cath without erythema.  Lab Results:  Lab Results  Component Value Date   WBC 8.4 06/06/2022   HGB 13.0 06/06/2022   HCT 40.9 06/06/2022   MCV 95.6 06/06/2022   PLT 167 06/06/2022   NEUTROABS 4.5 06/06/2022    Imaging:  No results found.  Medications: I have reviewed the patient's current medications.  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, biopsy confirmed adenocarcinoma         CTs chest, abdomen, and pelvis on 12/11/2015-indeterminate tiny pulmonary nodules, multiple liver metastases, small nodes in the sigmoid mesocolon Laparoscopic sigmoid colectomy, liver biopsy, Port-A-Cath placement 01/14/2016 Pathology sigmoid colon resection- colonic adenocarcinoma, 5.3 cm extending into pericolonic connective tissue, positive lymph-vascular invasion, positive perineural invasion, negative margins, metastatic carcinoma in 9 of 28 lymph nodes Pathology liver biopsy-metastatic colorectal adenocarcinoma MSI stable; mismatch repair protein  normal APC alteration, K RAS wild-type, no BRAF mutation Cycle 1 FOLFOX 02/02/2016 Cycle 2 FOLFOX 02/15/2016 Cycle 3 FOLFOX 02/29/2016 Cycle 4 FOLFOX 03/14/2016 Cycle 5 FOLFOX 03/28/2016 Cycle 6 FOLFOX 04/11/2016 (oxaliplatin held secondary to thrombocytopenia) 04/21/2016 restaging CTs-difficulty evaluating liver lesions due to hepatic steatosis. Stable right adrenal nodule. No adenopathy or local recurrence near the rectosigmoid anastomotic site. Cycle 7 FOLFOX 04/25/2016 MRI liver 05/02/2016-partial improvement in hepatic metastases Cycle 8 FOLFOX 05/10/2016 Cycle 9 FOLFOX 05/23/2016 (oxaliplatin held due to thrombocytopenia) Cycle 10 FOLFOX 06/06/2016 (oxaliplatin dose reduced due to thrombocytopenia) Cycle 11 FOLFOX 06/27/2016 (oxaliplatin held due to neuropathy) Cycle 12 FOLFOX 07/11/2016 (oxaliplatin held) Initiation of maintenance Xeloda 7 days on/7 days off 07/27/2016 MRI liver 11/18/2016-decrease in hepatic metastatic disease. No new or progressive disease identified within the abdomen. Continuation of Xeloda 7 days on/7 days off MRI liver 04/27/2017-previous liver lesions not identified, no new lesions, no lymphadenopathy Xeloda continued 7 days on/7 days off MRI liver 12/04/2017 - no evidence of metastatic disease, hepatic steatosis Xeloda continued 7 days on/7 days off MRI liver 07/15/2018- no evidence of metastatic disease.  Stable severe hepatic steatosis. Xeloda continued 7 days on/7 days off MRI liver 03/16/2019-hepatic steatosis, no liver mass, focal area of intrahepatic biliary dilatation in segments 2 and 3 of the left lobe-increased Xeloda continued 7 days on/7 days off MRI abdomen 08/19/2019-no findings to suggest liver metastases.  Bilateral lung nodules measuring up to 2.8 cm, progressive and more conspicuous than on previous exam CT chest 08/29/2019-multiple pulmonary metastases, new from 04/21/2016 Cycle 1 FOLFIRI/bevacizumab 09/09/2019 Cycle 2 FOLFIRI/bevacizumab  09/26/2019  Cycle 3 FOLFIRI/bevacizumab 10/10/2019 Cycle 4 FOLFIRI/bevacizumab 10/24/2019 Cycle 5 FOLFIRI/bevacizumab 11/07/2019 CT chest 11/14/2019-decreased size of lung  nodules, no new lesions, hepatic steatosis Cycle 6 FOLFIRI/bevacizumab 11/21/2019 Cycle 7 FOLFIRI/bevacizumab 12/05/2019 Cycle 8 FOLFIRI/bevacizumab 12/19/2019 Cycle 9 FOLFIRI/bevacizumab 01/02/2020 Cycle 10 FOLFIRI/bevacizumab 01/16/2020 CT chest 01/29/2020-stable bilateral pulmonary metastases.  No new or progressive metastatic disease in the chest. Cycle 11 FOLFIRI/bevacizumab 01/30/2020 Cycle 12 FOLFIRI/bevacizumab 02/19/2020 Cycle 13 FOLFIRI/bevacizumab 03/12/2020 Cycle 14 FOLFIRI/bevacizumab 04/02/2020 Cycle 15 FOLFIRI/bevacizumab 04/23/2020 Cycle 16 FOLFIRI/bevacizumab 05/21/2020 CT chest 06/09/2020-mild progression pulmonary metastasis.  Some lesions have increased in size while others are similar. Cycle 1 irinotecan/Panitumumab 06/18/2020 Cycle 2 irinotecan/Panitumumab 07/02/2020 Cycle 3 irinotecan/panitumumab 07/16/2020 Cycle 4 irinotecan/Panitumumab 07/30/2020 Cycle 5 irinotecan/Panitumumab 08/13/2020, Emend added Cycle 6 irinotecan/Panitumumab 08/27/2020 CT chest 09/08/2020-decreased size of pulmonary nodules, no progressive disease Cycle 7 irinotecan/panitumumab 09/02/2020 Cycle 8 irinotecan/panitumumab 09/24/2020 Cycle 9 irinotecan/Panitumumab 10/08/2020 10/22/2020 treatment held due to left foot fracture, need for surgery Cycle 10 irinotecan/panitumumab 11/24/2020 Cycle 11 irinotecan/Panitumumab 12/08/2020 Cycle 12 irinotecan/panitumumab 12/22/2020 Cycle 13 irinotecan/panitumumab 01/05/2021 01/20/2021 CT chest-mixed response with minimal increase in size of some lesions and minimal decrease in the size of other lesions.  Overall number of lesions is unchanged. Cycle 14 irinotecan/Panitumumab 02/05/2021 Cycle 15 irinotecan/Panitumumab 02/23/2021 Cycle 16 irinotecan/panitumumab 03/16/2021 Cycle 17 irinotecan/Panitumumab  04/13/2021 05/03/2021-CT chest-enlargement of pulmonary metastases, no new lesions 06/21/2021-cycle 1 Lonsurf/bevacizumab 07/19/2021-cycle 2 Lonsurf/bevacizumab 08/16/2021-cycle 3 Lonsurf/bevacizumab 09/09/2021 CT chest-bilateral lung nodules and masses with mixed response, overall stable to very minimally increased 09/13/2021 cycle 4 Lonsurf/bevacizumab 10/12/2021 cycle 5 Lonsurf/bevacizumab 10/21/2021 chest CT-bilateral pulmonary metastases without significant change.  No new or progressive disease within the chest. 11/08/2021 cycle 6 Lonsurf/bevacizumab 12/06/2021 cycle 7 Lonsurf/bevacizumab 01/03/2022 cycle 8 Lonsurf/bevacizumab CTs 01/25/2022-index lung lesion stable to mildly increased in size.  Mild retroperitoneal adenopathy, mildly increased in size compared to 12/16/2020. 02/01/2022 cycle 9 Lonsurf/bevacizumab 02/28/2022 cycle 10 Lonsurf/bevacizumab 03/28/2022 cycle 11 Lonsurf/bevacizumab CTs 04/21/2022-no change in bilateral pulmonary metastases, 2 new hypoattenuating liver lesions, new enlarged portacaval node Cycle 1 fruquintinib 05/24/2022   2.   Rectal bleeding and constipation secondary to #1   3.   History of peripheral vascular disease, status post left lower extremity vascular bypass surgery in April 2017   4.   History of nephrolithiasis   5.   History of Graves' disease treated with radioactive iodine   6.   Anxiety/depression   7.   Hypertension   8.   Hospitalization 01/19/2016 with wound dehiscence status post secondary suture closure of abdominal wall   9.   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 Hyperglycemia 06/20/2016-diagnosed with diabetes, maintained on insulin   11.  Positive COVID test 12/13/2018; positive COVID test 01/25/2021   12. Hospitalized with seizure activity/DKA. Now on Keppra, insulin adjusted. Brain MRI 10/25/2019 with no seizure etiology identified, no acute abnormality; EEG 10/25/2019 with evidence  of epileptogenicity arising from right frontocentral region. 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  Left foot fracture-surgical repair 11/11/2020    Disposition: Jeff [name] appears stable.  He began cycle 1 fruquintinib 05/24/2022.  He seems to be tolerating well.  Plan to continue the same to complete 21 days, then a 7-day break.  CBC reviewed.  Counts adequate to continue fruquintinib.  He has a follow-up appointment with the dentist tomorrow.  If adequate healing of the extraction sites is a concern we will consider placing Fruquitinib on hold temporarily.  He will return for lab and follow-up on 06/20/2022.  We are available to see him sooner if needed.  Ned Card ANP/GNP-BC  06/06/2022  10:56 AM

## 2022-06-06 NOTE — Addendum Note (Signed)
Addended by: Owens Shark on: 06/06/2022 11:17 AM   Modules accepted: Orders

## 2022-06-07 ENCOUNTER — Telehealth: Payer: Self-pay

## 2022-06-07 ENCOUNTER — Other Ambulatory Visit (HOSPITAL_COMMUNITY): Payer: Self-pay

## 2022-06-07 NOTE — Telephone Encounter (Signed)
I called and spoke with Mr. Halk, he stated  his dentist said everything is healing right.

## 2022-06-08 ENCOUNTER — Emergency Department (HOSPITAL_COMMUNITY)
Admission: EM | Admit: 2022-06-08 | Discharge: 2022-06-09 | Disposition: A | Discharge status: Home / Self Care | Payer: Medicaid Other | Attending: Emergency Medicine | Admitting: Emergency Medicine

## 2022-06-08 ENCOUNTER — Other Ambulatory Visit: Payer: Self-pay

## 2022-06-08 ENCOUNTER — Emergency Department (HOSPITAL_COMMUNITY): Payer: Medicaid Other

## 2022-06-08 ENCOUNTER — Encounter (HOSPITAL_COMMUNITY): Payer: Self-pay

## 2022-06-08 DIAGNOSIS — I10 Essential (primary) hypertension: Secondary | ICD-10-CM | POA: Insufficient documentation

## 2022-06-08 DIAGNOSIS — R000 Tachycardia, unspecified: Secondary | ICD-10-CM | POA: Diagnosis not present

## 2022-06-08 DIAGNOSIS — M545 Low back pain, unspecified: Secondary | ICD-10-CM | POA: Insufficient documentation

## 2022-06-08 DIAGNOSIS — Z7984 Long term (current) use of oral hypoglycemic drugs: Secondary | ICD-10-CM | POA: Insufficient documentation

## 2022-06-08 DIAGNOSIS — E860 Dehydration: Secondary | ICD-10-CM | POA: Diagnosis not present

## 2022-06-08 DIAGNOSIS — Z85038 Personal history of other malignant neoplasm of large intestine: Secondary | ICD-10-CM | POA: Insufficient documentation

## 2022-06-08 DIAGNOSIS — I2510 Atherosclerotic heart disease of native coronary artery without angina pectoris: Secondary | ICD-10-CM | POA: Insufficient documentation

## 2022-06-08 DIAGNOSIS — E119 Type 2 diabetes mellitus without complications: Secondary | ICD-10-CM | POA: Insufficient documentation

## 2022-06-08 DIAGNOSIS — Z794 Long term (current) use of insulin: Secondary | ICD-10-CM | POA: Insufficient documentation

## 2022-06-08 DIAGNOSIS — N179 Acute kidney failure, unspecified: Secondary | ICD-10-CM | POA: Diagnosis not present

## 2022-06-08 DIAGNOSIS — Z7982 Long term (current) use of aspirin: Secondary | ICD-10-CM | POA: Insufficient documentation

## 2022-06-08 LAB — COMPREHENSIVE METABOLIC PANEL
ALT: 34 U/L (ref 0–44)
AST: 41 U/L (ref 15–41)
Albumin: 3.8 g/dL (ref 3.5–5.0)
Alkaline Phosphatase: 78 U/L (ref 38–126)
Anion gap: 14 (ref 5–15)
BUN: 14 mg/dL (ref 6–20)
CO2: 23 mmol/L (ref 22–32)
Calcium: 9.2 mg/dL (ref 8.9–10.3)
Chloride: 101 mmol/L (ref 98–111)
Creatinine, Ser: 1.64 mg/dL — ABNORMAL HIGH (ref 0.61–1.24)
GFR, Estimated: 49 mL/min — ABNORMAL LOW (ref >60)
Glucose, Bld: 130 mg/dL — ABNORMAL HIGH (ref 70–99)
Potassium: 3.2 mmol/L — ABNORMAL LOW (ref 3.5–5.1)
Sodium: 138 mmol/L (ref 135–145)
Total Bilirubin: 0.6 mg/dL (ref 0.3–1.2)
Total Protein: 8.4 g/dL — ABNORMAL HIGH (ref 6.5–8.1)

## 2022-06-08 LAB — TROPONIN I (HIGH SENSITIVITY)
Troponin I (High Sensitivity): 9 ng/L (ref <18)
Troponin I (High Sensitivity): 11 ng/L (ref <18)

## 2022-06-08 LAB — CBC WITH DIFFERENTIAL/PLATELET
Abs Immature Granulocytes: 0.03 10*3/uL (ref 0.00–0.07)
Basophils Absolute: 0 10*3/uL (ref 0.0–0.1)
Basophils Relative: 0 %
Eosinophils Absolute: 0 10*3/uL (ref 0.0–0.5)
Eosinophils Relative: 0 %
HCT: 36.6 % — ABNORMAL LOW (ref 39.0–52.0)
Hemoglobin: 11.7 g/dL — ABNORMAL LOW (ref 13.0–17.0)
Immature Granulocytes: 0 %
Lymphocytes Relative: 46 %
Lymphs Abs: 5.6 10*3/uL — ABNORMAL HIGH (ref 0.7–4.0)
MCH: 30.6 pg (ref 26.0–34.0)
MCHC: 32 g/dL (ref 30.0–36.0)
MCV: 95.8 fL (ref 80.0–100.0)
Monocytes Absolute: 0.9 10*3/uL (ref 0.1–1.0)
Monocytes Relative: 8 %
Neutro Abs: 5.7 10*3/uL (ref 1.7–7.7)
Neutrophils Relative %: 46 %
Platelets: 168 10*3/uL (ref 150–400)
RBC: 3.82 MIL/uL — ABNORMAL LOW (ref 4.22–5.81)
RDW: 15 % (ref 11.5–15.5)
WBC: 12.3 10*3/uL — ABNORMAL HIGH (ref 4.0–10.5)
nRBC: 0 % (ref 0.0–0.2)

## 2022-06-08 LAB — LIPASE, BLOOD: Lipase: 35 U/L (ref 11–51)

## 2022-06-08 MED ORDER — MORPHINE SULFATE (PF) 4 MG/ML IV SOLN
4.0000 mg | Freq: Once | INTRAVENOUS | Status: AC
  Administered 2022-06-08: 4 mg via INTRAVENOUS
  Filled 2022-06-08: qty 1

## 2022-06-08 MED ORDER — POTASSIUM CHLORIDE CRYS ER 20 MEQ PO TBCR
40.0000 meq | EXTENDED_RELEASE_TABLET | Freq: Once | ORAL | Status: AC
  Administered 2022-06-08: 40 meq via ORAL
  Filled 2022-06-08: qty 2

## 2022-06-08 MED ORDER — SODIUM CHLORIDE 0.9 % IV BOLUS
1000.0000 mL | Freq: Once | INTRAVENOUS | Status: AC
  Administered 2022-06-08: 1000 mL via INTRAVENOUS

## 2022-06-08 MED ORDER — IOHEXOL 350 MG/ML SOLN
100.0000 mL | Freq: Once | INTRAVENOUS | Status: AC | PRN
  Administered 2022-06-08: 100 mL via INTRAVENOUS

## 2022-06-08 NOTE — ED Provider Notes (Incomplete)
[place] EMERGENCY DEPARTMENT AT [facility] Provider Note   CSN: [id number] Arrival date & time: 06/08/22  1631     History {Add pertinent medical, surgical, social history, OB history to HPI:1} Chief Complaint  Patient presents with  . Flank Pain  . Coagulation Disorder    Jeff [name] is a 57 y.o. male with past medical history significant for hypertension, history of PAD, CAD, previous SBO, with diabetes, colon cancer with metastasis who presents with concern for some left lower back to flank pain, bleeding gums, and some numbness across the chest area.  Patient reports that the numbness across the chest was present earlier today but is since resolved, he denies any shortness of breath or chest pain with exertion.  Patient does endorse a history of kidney stones, denies any dysuria, hematuria, or difficulty with urination.  He reports he had a dental procedure a few weeks ago with some teeth pulled and had some gums bleeding nonstop earlier today which is since resolved.  Patient arrives to the emergency department with tachycardia, reports that due to dental pain he has not had much to eat or drink over the last several days.   Flank Pain       Home Medications Prior to Admission medications   Medication Sig Start Date End Date Taking? Authorizing Provider  ACCU-CHEK GUIDE test strip CHECK BLOOD SUGAR THREE TIMES DAILY AS DIRECTED 08/14/20   [provider]  ACCU-CHEK SOFTCLIX LANCETS lancets USE  TID TO CHECK BLOOD SUGAR LEVELS 06/22/16   [provider]  amLODipine (NORVASC) 10 MG tablet Take 1 tablet (10 mg total) by mouth daily. 03/14/22   Almyra Deforest, PA  aspirin EC 81 MG tablet Take 81 mg by mouth daily.    [provider]  atorvastatin (LIPITOR) 80 MG tablet Take 1 tablet (80 mg total) by mouth daily. TAKE 1 TABLET(80 MG) BY MOUTH AT BEDTIME 03/14/22   Almyra Deforest, PA  dapagliflozin propanediol (FARXIGA) 10 MG TABS tablet Take 10 mg by  mouth daily.    [provider]  diphenoxylate-atropine (LOMOTIL) 2.5-0.025 MG tablet Take 1-2 tablets by mouth 4 (four) times daily as needed for diarrhea or loose stools. 06/06/22   Owens Shark, NP  docusate sodium (COLACE) 100 MG capsule Take 100 mg by mouth 2 (two) times daily.    [provider]  Fruquintinib 5 MG CAPS Take 1 tablet by mouth daily. Take for 21 days, then hold for 7 days. Repeat every 28 days. 05/10/22   Ladell Pier, MD  GLOBAL EASE INJECT PEN NEEDLES 31G X 5 MM MISC Inject 1 Syringe into the skin 4 (four) times daily. 09/18/20   [provider]  insulin aspart (NOVOLOG) 100 UNIT/ML injection Inject 12 Units into the skin 3 (three) times daily before meals. Patient taking differently: Inject 18 Units into the skin 3 (three) times daily before meals. 10/26/19   Caren Griffins, MD  Insulin Degludec (TRESIBA FLEXTOUCH [place]) Inject 80 Units into the skin daily.    [provider]  levETIRAcetam (KEPPRA) 750 MG tablet Take 1 tablet twice a day 10/06/21   Cameron Sprang, MD  levothyroxine (SYNTHROID) 150 MCG tablet Take 150 mcg by mouth daily. 07/27/21   [provider]  lidocaine-prilocaine (EMLA) cream Apply 1 Application topically as needed. Apply to portacath site 1 hour prior to use 11/08/21   Owens Shark, NP  LORazepam (ATIVAN) 0.5 MG tablet Take 1 tablet (0.5 mg  total) by mouth every 8 (eight) hours as needed for anxiety. 02/01/22   Owens Shark, NP  meclizine (ANTIVERT) 25 MG tablet Take 25 mg by mouth 3 (three) times daily as needed for dizziness. Patient not taking: Reported on 04/11/2022 02/25/16   [provider]  metFORMIN (GLUCOPHAGE-XR) 500 MG 24 hr tablet Take 1,000 mg by mouth in the morning and at bedtime. 10/30/16   [provider]  metoprolol tartrate (LOPRESSOR) 50 MG tablet Take 1.5 tablets (75 mg total) by mouth 2 (two) times daily. 03/14/22   Almyra Deforest, PA  mirtazapine (REMERON) 15 MG tablet  Take 15 mg by mouth at bedtime. 07/29/21   [provider]  nitroGLYCERIN (NITROSTAT) 0.4 MG SL tablet DISSOLVE 1 TABLET UNDER TONGUE EVERY 5 MINUTES AS NEEDED FOR CHEST PAIN. IF NO RELIEF AFTER 3 DOSES CALL 911. Patient not taking: Reported on 03/15/2022 06/16/21   Wellington Hampshire, MD  ondansetron (ZOFRAN) 8 MG tablet Take 1 tablet (8 mg total) by mouth every 8 (eight) hours as needed for nausea or vomiting. 03/28/22   Owens Shark, NP  oxyCODONE ER [facility] ER) 13.5 MG C12A Take by mouth. 10/19/21   [provider]  oxyCODONE-acetaminophen (PERCOCET) 10-325 MG tablet Take 1 tablet by mouth every 6 (six) hours as needed. 12/02/20   [provider]  pantoprazole (PROTONIX) 40 MG tablet Take 40 mg by mouth 2 (two) times daily. 10/23/15   [provider]  potassium chloride SA (KLOR-CON M) 20 MEQ tablet TAKE 1 TABLET BY MOUTH DAILY. 03/22/22   Ladell Pier, MD  promethazine (PHENERGAN) 12.5 MG tablet Take 1 tablet (12.5 mg total) by mouth every 6 (six) hours as needed. 02/01/22   Owens Shark, NP  sildenafil (VIAGRA) 100 MG tablet Take 100 mg by mouth daily as needed. 04/21/21   [provider]  tamsulosin (FLOMAX) 0.4 MG CAPS capsule Take 0.4 mg by mouth 2 (two) times daily.    [provider]  tiZANidine (ZANAFLEX) 4 MG tablet Take 4 mg by mouth 3 (three) times daily. 07/22/21   [provider]  venlafaxine XR (EFFEXOR-XR) 75 MG 24 hr capsule Take 225 mg by mouth daily. 09/09/19   [provider]  Vitamin D, Ergocalciferol, (DRISDOL) 1.25 MG (50000 UNIT) CAPS capsule Take 50,000 Units by mouth once a week. 01/15/22   [provider]      Allergies    Hydrocodone    Review of Systems   Review of Systems  Genitourinary:  Positive for flank pain.  All other systems reviewed and are negative.   Physical Exam Updated Vital Signs BP 120/79   Pulse (!) 119   Temp 98.2 F (36.8 C) (Oral)   Resp 14   Ht '5\' 10"'$  (1.778 m)    Wt 97 kg   SpO2 98%   BMI 30.68 kg/m  Physical Exam Vitals and nursing note reviewed.  Constitutional:      General: He is not in acute distress.    Appearance: Normal appearance.  HENT:     Head: Normocephalic and atraumatic.     Mouth/Throat:     Comments: Patient with nonbleeding gums with empty tooth sockets throughout other than some remaining cavitated molars on the back right.  No significant redness, swelling, abscess formation noted. Eyes:     General:        Right eye: No discharge.        Left eye: No discharge.  Cardiovascular:  Rate and Rhythm: Regular rhythm. Tachycardia present.     Heart sounds: No murmur heard.    No friction rub. No gallop.  Pulmonary:     Effort: Pulmonary effort is normal.     Breath sounds: Normal breath sounds.  Abdominal:     General: Bowel sounds are normal.     Palpations: Abdomen is soft.  Musculoskeletal:     Comments: Patient with some tenderness to palpation in the left lumbar paraspinous region, with no rebound, rigidity, guarding, no CVA tenderness bilaterally.  No anterior abdominal tenderness to palpation on my exam.  Skin:    General: Skin is warm and dry.     Capillary Refill: Capillary refill takes less than 2 seconds.  Neurological:     Mental Status: He is alert and oriented to person, place, and time.  Psychiatric:        Mood and Affect: Mood normal.        Behavior: Behavior normal.     ED Results / Procedures / Treatments   Labs (all labs ordered are listed, but only abnormal results are displayed) Labs Reviewed  CBC WITH DIFFERENTIAL/PLATELET - Abnormal; Notable for the following components:      Result Value   WBC 12.3 (*)    RBC 3.82 (*)    Hemoglobin 11.7 (*)    HCT 36.6 (*)    Lymphs Abs 5.6 (*)    All other components within normal limits  COMPREHENSIVE METABOLIC PANEL - Abnormal; Notable for the following components:   Potassium 3.2 (*)    Glucose, Bld 130 (*)    Creatinine, Ser 1.64 (*)     Total Protein 8.4 (*)    GFR, Estimated 49 (*)    All other components within normal limits  LIPASE, BLOOD  URINALYSIS, ROUTINE W REFLEX MICROSCOPIC  TROPONIN I (HIGH SENSITIVITY)  TROPONIN I (HIGH SENSITIVITY)    EKG None  Radiology CT ABDOMEN PELVIS W CONTRAST  Result Date: 06/08/2022 CLINICAL DATA:  Left-sided flank pain. History of metastatic colon cancer. EXAM: CT ABDOMEN AND PELVIS WITH CONTRAST TECHNIQUE: Multidetector CT imaging of the abdomen and pelvis was performed using the standard protocol following bolus administration of intravenous contrast. RADIATION DOSE REDUCTION: This exam was performed according to the departmental dose-optimization program which includes automated exposure control, adjustment of the mA and/or kV according to patient size and/or use of iterative reconstruction technique. CONTRAST:  164m OMNIPAQUE IOHEXOL 350 MG/ML SOLN COMPARISON:  CT abdomen/pelvis 04/21/2022. FINDINGS: Lower chest: The chest is assessed on the separately dictated CTA chest. Hepatobiliary: Ovoid hypodensity with central hyperdensity in hepatic segment II/III is unchanged. The 1.4 cm hypodense lesion in segment VII and 1.3 cm lesion in segment VI are unchanged. A 1.0 cm lesion in segment II is increased in conspicuity compared to the prior study (4-16). Small calcified gallstones are again noted without evidence of acute cholecystitis. There is no biliary ductal dilatation. Pancreas: Unremarkable. Spleen: Unremarkable. Adrenals/Urinary Tract: The 1.4 cm right adrenal nodule is unchanged. The left adrenal is unremarkable. Small nonobstructing bilateral renal stones are again seen. There are no suspicious renal lesions. There is no excretion of contrast into the collecting systems on the delayed images. There is no hydronephrosis or hydroureter. There are no stones along the courses of the ureters. The bladder is unremarkable. Stomach/Bowel: The stomach is unremarkable. There is no evidence of  bowel obstruction. Postsurgical changes are noted in the distal colon without evidence of complication or local recurrence at the suture site.  There is no abnormal bowel wall thickening or inflammatory change. The appendix is normal. Vascular/Lymphatic: There is calcified atherosclerotic plaque in the nonaneurysmal abdominal aorta. The major branch vessels are patent. The main portal and splenic veins are patent. The 1.5 cm portacaval lymph node is not significantly changed in size (4-29). Additional 7 mm portacaval lymph nodes are unchanged (4-26). The 1.3 cm left para-aortic lymph node is unchanged (4-51). There is no new or enlarging lymphadenopathy in the abdomen or pelvis. Reproductive: The prostate is enlarged, unchanged. Other: There is no ascites or free air. Musculoskeletal: Postsurgical changes reflecting L4 through S1 posterior instrumented fusion are again seen. There is no acute osseous abnormality or suspicious osseous lesion. IMPRESSION: 1. No acute findings in the abdomen or pelvis to explain the patient's pain. 2. A 1.0 cm lesion in hepatic segment II is new/increased in conspicuity compared to the prior study from 04/21/2022. Two other hypodense liver lesions are unchanged. 3. Unchanged enlarged portacaval and left para-aortic lymph nodes. 4. Unchanged right adrenal nodule. 5. Nonobstructing bilateral renal stones. 6. Cholelithiasis without evidence of acute cholecystitis. Electronically Signed   By: Valetta Mole M.D.   On: 06/08/2022 19:48   CT Angio Chest PE W and/or Wo Contrast  Result Date: 06/08/2022 CLINICAL DATA:  Concern for pulmonary embolism.  Colon cancer. EXAM: CT ANGIOGRAPHY CHEST WITH CONTRAST TECHNIQUE: Multidetector CT imaging of the chest was performed using the standard protocol during bolus administration of intravenous contrast. Multiplanar CT image reconstructions and MIPs were obtained to evaluate the vascular anatomy. RADIATION DOSE REDUCTION: This exam was performed  according to the departmental dose-optimization program which includes automated exposure control, adjustment of the mA and/or kV according to patient size and/or use of iterative reconstruction technique. CONTRAST:  139m OMNIPAQUE IOHEXOL 350 MG/ML SOLN COMPARISON:  CT dated 04/21/2022. chest radiograph dated 06/08/2022. FINDINGS: Cardiovascular: There is no cardiomegaly. Small pericardial effusion measuring 1 cm in thickness anterior to the heart. Minimal atherosclerotic calcification of the aortic arch. No aneurysmal dilatation or dissection. No pulmonary artery embolus identified. Right-sided Port-A-Cath with tip in central SVC. Mediastinum/Nodes: Right hilar adenopathy measures 14 mm in short axis similar to prior CT. The esophagus is grossly unremarkable. No mediastinal fluid collection. Lungs/Pleura: No significant interval change in bilateral pulmonary masses compared to prior CT. The largest mass in the right lower lobe measures approximately 3.6 x 2.5 cm. No new consolidation. There is no pleural effusion or pneumothorax. The central airways are patent. Upper Abdomen: Stable 12 mm right adrenal nodule. Small amount of stone within the gallbladder. Left renal upper pole calculi measure up to 4 mm. Musculoskeletal: No chest wall abnormality. No acute or significant osseous findings. Review of the MIP images confirms the above findings. IMPRESSION: 1. No acute intrathoracic pathology. No CT evidence of pulmonary artery embolus. 2. No significant interval change in bilateral pulmonary masses compared to prior CT. 3. Stable right hilar adenopathy. 4. Stable 12 mm right adrenal nodule. 5. Cholelithiasis. 6. Nonobstructing left renal upper pole calculi. Electronically Signed   By: AAnner CreteM.D.   On: 06/08/2022 19:40   DG Chest 1 View  Result Date: 06/08/2022 CLINICAL DATA:  Chest pain. Shortness of breath and dizziness. Chest numbness. EXAM: CHEST  1 VIEW COMPARISON:  Chest CT 04/21/2022 FINDINGS: A  right jugular Port-A-Cath terminates over the mid SVC. The cardiac silhouette is normal in size. Bilateral pulmonary masses are similar to the prior CT. No superimposed acute airspace consolidation, edema, sizable pleural effusion, or pneumothorax is  identified. No acute osseous abnormality is seen. IMPRESSION: Known bilateral pulmonary masses. No evidence of superimposed acute airspace disease. Electronically Signed   By: Logan Bores M.D.   On: 06/08/2022 18:19    Procedures Procedures  {Document cardiac monitor, telemetry assessment procedure when appropriate:1}  Medications Ordered in ED Medications  iohexol (OMNIPAQUE) 350 MG/ML injection 100 mL (100 mLs Intravenous Contrast Given 06/08/22 1848)  sodium chloride 0.9 % bolus 1,000 mL (1,000 mLs Intravenous New Bag/Given 06/08/22 2313)  potassium chloride SA (KLOR-CON M) CR tablet 40 mEq (40 mEq Oral Given 06/08/22 2311)  morphine (PF) 4 MG/ML injection 4 mg (4 mg Intravenous Given 06/08/22 2314)    ED Course/ Medical Decision Making/ A&P   {   Click here for ABCD2, HEART and other calculatorsREFRESH Note before signing :1}                          Medical Decision Making Risk Prescription drug management.   This patient is a 57 y.o. male  who presents to the ED for concern of ***.   Differential diagnoses prior to evaluation: The emergent differential diagnosis includes, but is not limited to,  *** . This is not an exhaustive differential.   Past Medical History / Co-morbidities: ***  Additional history: Chart reviewed. Pertinent results include: ***  Physical Exam: Physical exam performed. The pertinent findings include: ***  Lab Tests/Imaging studies: I personally interpreted labs/imaging and the pertinent results include:  ***. ***I agree with the radiologist interpretation.  Cardiac monitoring: EKG obtained and interpreted by my attending physician which shows: ***   Medications: I ordered medication including ***.  I  have reviewed the patients home medicines and have made adjustments as needed.   Disposition: After consideration of the diagnostic results and the patients response to treatment, I feel that *** .   ***emergency department workup does not suggest an emergent condition requiring admission or immediate intervention beyond what has been performed at this time. The plan is: ***. The patient is safe for discharge and has been instructed to return immediately for worsening symptoms, change in symptoms or any other concerns.  Final Clinical Impression(s) / ED Diagnoses Final diagnoses:  None    Rx / DC Orders ED Discharge Orders     None

## 2022-06-08 NOTE — ED Provider Notes (Signed)
[facility] Provider Note   CSN: [id number] Arrival date & time: 06/08/22  1631     History  Chief Complaint  Patient presents with   Flank Pain   Coagulation Disorder    Jeff [name] is a 57 y.o. male with past medical history significant for hypertension, history of PAD, CAD, previous SBO, with diabetes, colon cancer with metastasis who presents with concern for some left lower back to flank pain, bleeding gums, and some numbness across the chest area.  Patient reports that the numbness across the chest was present earlier today but is since resolved, he denies any shortness of breath or chest pain with exertion.  Patient does endorse a history of kidney stones, denies any dysuria, hematuria, or difficulty with urination.  He reports he had a dental procedure a few weeks ago with some teeth pulled and had some gums bleeding nonstop earlier today which is since resolved.  Patient arrives to the emergency department with tachycardia, reports that due to dental pain he has not had much to eat or drink over the last several days.   Flank Pain       Home Medications Prior to Admission medications   Medication Sig Start Date End Date Taking? Authorizing Provider  ACCU-CHEK GUIDE test strip CHECK BLOOD SUGAR THREE TIMES DAILY AS DIRECTED 08/14/20   [provider]  ACCU-CHEK SOFTCLIX LANCETS lancets USE  TID TO CHECK BLOOD SUGAR LEVELS 06/22/16   [provider]  amLODipine (NORVASC) 10 MG tablet Take 1 tablet (10 mg total) by mouth daily. 03/14/22   Almyra Deforest, PA  aspirin EC 81 MG tablet Take 81 mg by mouth daily.    [provider]  atorvastatin (LIPITOR) 80 MG tablet Take 1 tablet (80 mg total) by mouth daily. TAKE 1 TABLET(80 MG) BY MOUTH AT BEDTIME 03/14/22   Almyra Deforest, PA  dapagliflozin propanediol (FARXIGA) 10 MG TABS tablet Take 10 mg by mouth daily.    [provider]  diphenoxylate-atropine  (LOMOTIL) 2.5-0.025 MG tablet Take 1-2 tablets by mouth 4 (four) times daily as needed for diarrhea or loose stools. 06/06/22   Owens Shark, NP  docusate sodium (COLACE) 100 MG capsule Take 100 mg by mouth 2 (two) times daily.    [provider]  Fruquintinib 5 MG CAPS Take 1 tablet by mouth daily. Take for 21 days, then hold for 7 days. Repeat every 28 days. 05/10/22   Ladell Pier, MD  GLOBAL EASE INJECT PEN NEEDLES 31G X 5 MM MISC Inject 1 Syringe into the skin 4 (four) times daily. 09/18/20   [provider]  insulin aspart (NOVOLOG) 100 UNIT/ML injection Inject 12 Units into the skin 3 (three) times daily before meals. Patient taking differently: Inject 18 Units into the skin 3 (three) times daily before meals. 10/26/19   Caren Griffins, MD  Insulin Degludec (TRESIBA FLEXTOUCH [facility]) Inject 80 Units into the skin daily.    [provider]  levETIRAcetam (KEPPRA) 750 MG tablet Take 1 tablet twice a day 10/06/21   Cameron Sprang, MD  levothyroxine (SYNTHROID) 150 MCG tablet Take 150 mcg by mouth daily. 07/27/21   [provider]  lidocaine-prilocaine (EMLA) cream Apply 1 Application topically as needed. Apply to portacath site 1 hour prior to use 11/08/21   Owens Shark, NP  LORazepam (ATIVAN) 0.5 MG tablet Take 1 tablet (0.5 mg total) by mouth every 8 (eight) hours as needed  for anxiety. 02/01/22   Owens Shark, NP  meclizine (ANTIVERT) 25 MG tablet Take 25 mg by mouth 3 (three) times daily as needed for dizziness. Patient not taking: Reported on 04/11/2022 02/25/16   [provider]  metFORMIN (GLUCOPHAGE-XR) 500 MG 24 hr tablet Take 1,000 mg by mouth in the morning and at bedtime. 10/30/16   [provider]  metoprolol tartrate (LOPRESSOR) 50 MG tablet Take 1.5 tablets (75 mg total) by mouth 2 (two) times daily. 03/14/22   Almyra Deforest, PA  mirtazapine (REMERON) 15 MG tablet Take 15 mg by mouth at bedtime. 07/29/21   [provider]   nitroGLYCERIN (NITROSTAT) 0.4 MG SL tablet DISSOLVE 1 TABLET UNDER TONGUE EVERY 5 MINUTES AS NEEDED FOR CHEST PAIN. IF NO RELIEF AFTER 3 DOSES CALL 911. Patient not taking: Reported on 03/15/2022 06/16/21   Wellington Hampshire, MD  ondansetron (ZOFRAN) 8 MG tablet Take 1 tablet (8 mg total) by mouth every 8 (eight) hours as needed for nausea or vomiting. 03/28/22   Owens Shark, NP  oxyCODONE ER [facility] ER) 13.5 MG C12A Take by mouth. 10/19/21   [provider]  oxyCODONE-acetaminophen (PERCOCET) 10-325 MG tablet Take 1 tablet by mouth every 6 (six) hours as needed. 12/02/20   [provider]  pantoprazole (PROTONIX) 40 MG tablet Take 40 mg by mouth 2 (two) times daily. 10/23/15   [provider]  potassium chloride SA (KLOR-CON M) 20 MEQ tablet TAKE 1 TABLET BY MOUTH DAILY. 03/22/22   Ladell Pier, MD  promethazine (PHENERGAN) 12.5 MG tablet Take 1 tablet (12.5 mg total) by mouth every 6 (six) hours as needed. 02/01/22   Owens Shark, NP  sildenafil (VIAGRA) 100 MG tablet Take 100 mg by mouth daily as needed. 04/21/21   [provider]  tamsulosin (FLOMAX) 0.4 MG CAPS capsule Take 0.4 mg by mouth 2 (two) times daily.    [provider]  tiZANidine (ZANAFLEX) 4 MG tablet Take 4 mg by mouth 3 (three) times daily. 07/22/21   [provider]  venlafaxine XR (EFFEXOR-XR) 75 MG 24 hr capsule Take 225 mg by mouth daily. 09/09/19   [provider]  Vitamin D, Ergocalciferol, (DRISDOL) 1.25 MG (50000 UNIT) CAPS capsule Take 50,000 Units by mouth once a week. 01/15/22   [provider]      Allergies    Hydrocodone    Review of Systems   Review of Systems  Genitourinary:  Positive for flank pain.  All other systems reviewed and are negative.   Physical Exam Updated Vital Signs BP 139/82   Pulse (!) 101   Temp 98.3 F (36.8 C) (Oral)   Resp 16   Ht '5\' 10"'$  (1.778 m)   Wt 97 kg   SpO2 98%   BMI 30.68 kg/m  Physical Exam Vitals  and nursing note reviewed.  Constitutional:      General: He is not in acute distress.    Appearance: Normal appearance.  HENT:     Head: Normocephalic and atraumatic.     Mouth/Throat:     Comments: Patient with nonbleeding gums with empty tooth sockets throughout other than some remaining cavitated molars on the back right.  No significant redness, swelling, abscess formation noted. Eyes:     General:        Right eye: No discharge.        Left eye: No discharge.  Cardiovascular:     Rate and Rhythm: Regular rhythm. Tachycardia  present.     Heart sounds: No murmur heard.    No friction rub. No gallop.  Pulmonary:     Effort: Pulmonary effort is normal.     Breath sounds: Normal breath sounds.  Abdominal:     General: Bowel sounds are normal.     Palpations: Abdomen is soft.  Musculoskeletal:     Comments: Patient with some tenderness to palpation in the left lumbar paraspinous region, with no rebound, rigidity, guarding, no CVA tenderness bilaterally.  No anterior abdominal tenderness to palpation on my exam.  Skin:    General: Skin is warm and dry.     Capillary Refill: Capillary refill takes less than 2 seconds.  Neurological:     Mental Status: He is alert and oriented to person, place, and time.  Psychiatric:        Mood and Affect: Mood normal.        Behavior: Behavior normal.     ED Results / Procedures / Treatments   Labs (all labs ordered are listed, but only abnormal results are displayed) Labs Reviewed  CBC WITH DIFFERENTIAL/PLATELET - Abnormal; Notable for the following components:      Result Value   WBC 12.3 (*)    RBC 3.82 (*)    Hemoglobin 11.7 (*)    HCT 36.6 (*)    Lymphs Abs 5.6 (*)    All other components within normal limits  COMPREHENSIVE METABOLIC PANEL - Abnormal; Notable for the following components:   Potassium 3.2 (*)    Glucose, Bld 130 (*)    Creatinine, Ser 1.64 (*)    Total Protein 8.4 (*)    GFR, Estimated 49 (*)    All other  components within normal limits  URINALYSIS, ROUTINE W REFLEX MICROSCOPIC - Abnormal; Notable for the following components:   Glucose, UA >=500 (*)    Protein, ur 100 (*)    Bacteria, UA RARE (*)    All other components within normal limits  LIPASE, BLOOD  TROPONIN I (HIGH SENSITIVITY)  TROPONIN I (HIGH SENSITIVITY)    EKG None  Radiology CT ABDOMEN PELVIS W CONTRAST  Result Date: 06/08/2022 CLINICAL DATA:  Left-sided flank pain. History of metastatic colon cancer. EXAM: CT ABDOMEN AND PELVIS WITH CONTRAST TECHNIQUE: Multidetector CT imaging of the abdomen and pelvis was performed using the standard protocol following bolus administration of intravenous contrast. RADIATION DOSE REDUCTION: This exam was performed according to the departmental dose-optimization program which includes automated exposure control, adjustment of the mA and/or kV according to patient size and/or use of iterative reconstruction technique. CONTRAST:  132m OMNIPAQUE IOHEXOL 350 MG/ML SOLN COMPARISON:  CT abdomen/pelvis 04/21/2022. FINDINGS: Lower chest: The chest is assessed on the separately dictated CTA chest. Hepatobiliary: Ovoid hypodensity with central hyperdensity in hepatic segment II/III is unchanged. The 1.4 cm hypodense lesion in segment VII and 1.3 cm lesion in segment VI are unchanged. A 1.0 cm lesion in segment II is increased in conspicuity compared to the prior study (4-16). Small calcified gallstones are again noted without evidence of acute cholecystitis. There is no biliary ductal dilatation. Pancreas: Unremarkable. Spleen: Unremarkable. Adrenals/Urinary Tract: The 1.4 cm right adrenal nodule is unchanged. The left adrenal is unremarkable. Small nonobstructing bilateral renal stones are again seen. There are no suspicious renal lesions. There is no excretion of contrast into the collecting systems on the delayed images. There is no hydronephrosis or hydroureter. There are no stones along the courses of the  ureters. The bladder is unremarkable. Stomach/Bowel:  The stomach is unremarkable. There is no evidence of bowel obstruction. Postsurgical changes are noted in the distal colon without evidence of complication or local recurrence at the suture site. There is no abnormal bowel wall thickening or inflammatory change. The appendix is normal. Vascular/Lymphatic: There is calcified atherosclerotic plaque in the nonaneurysmal abdominal aorta. The major branch vessels are patent. The main portal and splenic veins are patent. The 1.5 cm portacaval lymph node is not significantly changed in size (4-29). Additional 7 mm portacaval lymph nodes are unchanged (4-26). The 1.3 cm left para-aortic lymph node is unchanged (4-51). There is no new or enlarging lymphadenopathy in the abdomen or pelvis. Reproductive: The prostate is enlarged, unchanged. Other: There is no ascites or free air. Musculoskeletal: Postsurgical changes reflecting L4 through S1 posterior instrumented fusion are again seen. There is no acute osseous abnormality or suspicious osseous lesion. IMPRESSION: 1. No acute findings in the abdomen or pelvis to explain the patient's pain. 2. A 1.0 cm lesion in hepatic segment II is new/increased in conspicuity compared to the prior study from 04/21/2022. Two other hypodense liver lesions are unchanged. 3. Unchanged enlarged portacaval and left para-aortic lymph nodes. 4. Unchanged right adrenal nodule. 5. Nonobstructing bilateral renal stones. 6. Cholelithiasis without evidence of acute cholecystitis. Electronically Signed   By: Valetta Mole M.D.   On: 06/08/2022 19:48   CT Angio Chest PE W and/or Wo Contrast  Result Date: 06/08/2022 CLINICAL DATA:  Concern for pulmonary embolism.  Colon cancer. EXAM: CT ANGIOGRAPHY CHEST WITH CONTRAST TECHNIQUE: Multidetector CT imaging of the chest was performed using the standard protocol during bolus administration of intravenous contrast. Multiplanar CT image reconstructions and  MIPs were obtained to evaluate the vascular anatomy. RADIATION DOSE REDUCTION: This exam was performed according to the departmental dose-optimization program which includes automated exposure control, adjustment of the mA and/or kV according to patient size and/or use of iterative reconstruction technique. CONTRAST:  146m OMNIPAQUE IOHEXOL 350 MG/ML SOLN COMPARISON:  CT dated 04/21/2022. chest radiograph dated 06/08/2022. FINDINGS: Cardiovascular: There is no cardiomegaly. Small pericardial effusion measuring 1 cm in thickness anterior to the heart. Minimal atherosclerotic calcification of the aortic arch. No aneurysmal dilatation or dissection. No pulmonary artery embolus identified. Right-sided Port-A-Cath with tip in central SVC. Mediastinum/Nodes: Right hilar adenopathy measures 14 mm in short axis similar to prior CT. The esophagus is grossly unremarkable. No mediastinal fluid collection. Lungs/Pleura: No significant interval change in bilateral pulmonary masses compared to prior CT. The largest mass in the right lower lobe measures approximately 3.6 x 2.5 cm. No new consolidation. There is no pleural effusion or pneumothorax. The central airways are patent. Upper Abdomen: Stable 12 mm right adrenal nodule. Small amount of stone within the gallbladder. Left renal upper pole calculi measure up to 4 mm. Musculoskeletal: No chest wall abnormality. No acute or significant osseous findings. Review of the MIP images confirms the above findings. IMPRESSION: 1. No acute intrathoracic pathology. No CT evidence of pulmonary artery embolus. 2. No significant interval change in bilateral pulmonary masses compared to prior CT. 3. Stable right hilar adenopathy. 4. Stable 12 mm right adrenal nodule. 5. Cholelithiasis. 6. Nonobstructing left renal upper pole calculi. Electronically Signed   By: AAnner CreteM.D.   On: 06/08/2022 19:40   DG Chest 1 View  Result Date: 06/08/2022 CLINICAL DATA:  Chest pain. Shortness of  breath and dizziness. Chest numbness. EXAM: CHEST  1 VIEW COMPARISON:  Chest CT 04/21/2022 FINDINGS: A right jugular Port-A-Cath terminates over the  mid SVC. The cardiac silhouette is normal in size. Bilateral pulmonary masses are similar to the prior CT. No superimposed acute airspace consolidation, edema, sizable pleural effusion, or pneumothorax is identified. No acute osseous abnormality is seen. IMPRESSION: Known bilateral pulmonary masses. No evidence of superimposed acute airspace disease. Electronically Signed   By: Logan Bores M.D.   On: 06/08/2022 18:19    Procedures Procedures    Medications Ordered in ED Medications  iohexol (OMNIPAQUE) 350 MG/ML injection 100 mL (100 mLs Intravenous Contrast Given 06/08/22 1848)  sodium chloride 0.9 % bolus 1,000 mL (0 mLs Intravenous Stopped 06/09/22 0003)  potassium chloride SA (KLOR-CON M) CR tablet 40 mEq (40 mEq Oral Given 06/08/22 2311)  morphine (PF) 4 MG/ML injection 4 mg (4 mg Intravenous Given 06/08/22 2314)  sodium chloride 0.9 % bolus 1,000 mL (0 mLs Intravenous Stopped 06/09/22 0259)    ED Course/ Medical Decision Making/ A&P                             Medical Decision Making Risk Prescription drug management.   This patient is a 57 y.o. male  who presents to the ED with a number of complaints including some left-sided flank pain versus low back pain, bleeding gums, some numbness across the chest area, with decreased p.o. intake over the last several days.   Differential diagnoses prior to evaluation: The emergent differential diagnosis includes, but is not limited to,  The causes of generalized abdominal pain include but are not limited to AAA, mesenteric ischemia, appendicitis, diverticulitis, DKA, gastritis, gastroenteritis, AMI, nephrolithiasis, pancreatitis, peritonitis, adrenal insufficiency,lead poisoning, iron toxicity, intestinal ischemia, constipation, UTI,SBO/LBO, splenic rupture, biliary disease, IBD, IBS, PUD, or  hepatitis, ACS, AAS, PE, Mallory-Weiss, Boerhaave's, Pneumonia, acute bronchitis, asthma or COPD exacerbation, anxiety, MSK pain or traumatic injury to the chest, acid reflux versus other . This is not an exhaustive differential.    Overall after initial physical exam I have some suspicion for dehydration given dry mucous membranes, his flank pain seems likely to be musculoskeletal in nature as he has lumbar paraspinous muscle tenderness, with no CVA tenderness, no abdominal tenderness on the anterior aspect.  Past Medical History / Co-morbidities:  PAD, CAD, previous SBO, with diabetes, colon cancer with metastasis  Additional history: Chart reviewed. Pertinent results include: Reviewed outpatient oncology visits including lab work, imaging  Physical Exam: Physical exam performed. The pertinent findings include: No active gum bleeding at this time, patient with tachycardia without arrhythmia, I am suspicious for dehydration, he had dry mucous membranes.  He appears improved with tachycardia improved after receiving 2 L fluid bolus.  He does have some left lumbar paraspinous muscle tenderness, with no significant CVA tenderness, or other abdominal tenderness.  Vital signs are stable otherwise throughout.  Lab Tests/Imaging studies: I personally interpreted labs/imaging and the pertinent results include: CBC notable for mild leukocytosis, white blood cells 12.3.  Mild anemia, hemoglobin 11.7.  No platelet abnormality.  UA with some protein and glucose, as well as hyaline casts suggesting mild dehydration.  Troponin negative x 2 in context of vague description of chest numbness without any overt chest pain.  Lipase unremarkable.  CMP notable for significantly elevated creatinine from baseline.  Creatinine 1.64 today with baseline creatinine around 1.  He also has some hypokalemia with potassium 3.2.  We will orally replete potassium, as well as administer fluid bolus for dehydration.  Independently  interpreted CT abdomen pelvis with contrast, CT  PE study, and plain film chest x-ray which showed no acute intrathoracic or intra-abdominal abnormality I agree with the radiologist interpretation.  Cardiac monitoring: EKG obtained and interpreted by my attending physician which shows: Sinus tachycardia   Medications: I ordered medication including multiple fluid bolus, oral potassium, morphine for pain.  I have reviewed the patients home medicines and have made adjustments as needed.  Patient with improvement of tachycardia, and his overall clinical appearance after fluid bolus.  Initially tachycardic pulse rate of 139, improved to around 100 at time of discharge.   Disposition: After consideration of the diagnostic results and the patients response to treatment, I feel that patient's clinical condition is consistent with decreased oral intake secondary to gum pain with no active bleeding today, dehydration secondary to poor oral intake, and low back pain.  No evidence of nephrolithiasis or other intra-abdominal or intrathoracic abnormality.   emergency department workup does not suggest an emergent condition requiring admission or immediate intervention beyond what has been performed at this time. The plan is: as above. The patient is safe for discharge and has been instructed to return immediately for worsening symptoms, change in symptoms or any other concerns.  Final Clinical Impression(s) / ED Diagnoses Final diagnoses:  Lumbar back pain  Dehydration  AKI (acute kidney injury) [facility])    Rx / DC Orders ED Discharge Orders     None         Anselmo Pickler, PA-C 80/22/33 [id number]    Delora Fuel, MD 44/97/53 [phone]

## 2022-06-08 NOTE — ED Triage Notes (Signed)
Pt states that yesterday he had teeth pulled and had issues with his gums bleeding almost nonstop until earlier today. Endorses SOB, dizziness. Pt also c/o L sided flank pain and "chest numbness".

## 2022-06-08 NOTE — ED Provider Triage Note (Signed)
Emergency Medicine Provider Triage Evaluation Note  Jeff [name] , a 57 y.o. male  was evaluated in triage.  Pt complains of shortness of breath and left-sided flank pain.  Also endorses "chest numbness".  History of metastatic colon cancer.  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 Patient admits to a recent mouth surgery.  Had a revision yesterday and has had nonstop bleeding since earlier this morning. History of kidney stone  Review of Systems  Positive: CP, SOB, flank pain Negative: fever  Physical Exam  BP (!) 123/90   Pulse (!) 139   Temp 97.9 F (36.6 C) (Oral)   Resp 15   SpO2 100%  Gen:   Awake, no distress   Resp:  Normal effort  MSK:   Moves extremities without difficulty  Other:  tachycardic  Medical Decision Making  Medically screening exam initiated at 5:11 PM.  Appropriate orders placed.  Jeff [name] was informed that the remainder of the evaluation will be completed by another provider, this initial triage assessment does not replace that evaluation, and the importance of remaining in the ED until their evaluation is complete.  HR 150s. Hx DVT and cancer. CTA to rule out PE. Left sided flank pain. CT abdomen Labs ordered   Jeff [name] 06/08/22 1713

## 2022-06-09 LAB — URINALYSIS, ROUTINE W REFLEX MICROSCOPIC
Bilirubin Urine: NEGATIVE
Glucose, UA: >=500 mg/dL — AB
Hgb urine dipstick: NEGATIVE
Ketones, ur: NEGATIVE mg/dL
Leukocytes,Ua: NEGATIVE
Nitrite: NEGATIVE
Protein, ur: 100 mg/dL — AB
Specific Gravity, Urine: 1.01 (ref 1.005–1.030)
pH: 5 (ref 5.0–8.0)

## 2022-06-09 MED ORDER — SODIUM CHLORIDE 0.9 % IV BOLUS
1000.0000 mL | Freq: Once | INTRAVENOUS | Status: AC
  Administered 2022-06-09: 1000 mL via INTRAVENOUS

## 2022-06-09 NOTE — Discharge Instructions (Signed)
Please try to stay hydrated, drink some broth, electrolyte containing fluids such as Pedialyte, Gatorade.  Please follow-up with your PCP to recheck your kidney function in 1 to 2 weeks.

## 2022-06-09 NOTE — ED Notes (Signed)
Pt verbalized understanding of discharge instructions. Pt wheeled from ED and transferred to vehicle family to drive home.

## 2022-06-14 ENCOUNTER — Other Ambulatory Visit (HOSPITAL_COMMUNITY): Payer: Self-pay

## 2022-06-14 ENCOUNTER — Other Ambulatory Visit: Payer: Self-pay | Admitting: Oncology

## 2022-06-14 DIAGNOSIS — C187 Malignant neoplasm of sigmoid colon: Secondary | ICD-10-CM

## 2022-06-15 ENCOUNTER — Other Ambulatory Visit (HOSPITAL_COMMUNITY): Payer: Self-pay

## 2022-06-15 MED ORDER — FRUZAQLA 5 MG PO CAPS
1.0000 | ORAL_CAPSULE | Freq: Every day | ORAL | 0 refills | Status: DC
  Filled 2022-06-15: qty 21, 21d supply, fill #0

## 2022-06-20 ENCOUNTER — Inpatient Hospital Stay: Payer: Medicaid Other

## 2022-06-20 ENCOUNTER — Inpatient Hospital Stay: Payer: Medicaid Other | Attending: Oncology

## 2022-06-20 ENCOUNTER — Other Ambulatory Visit: Payer: Self-pay | Admitting: *Deleted

## 2022-06-20 ENCOUNTER — Inpatient Hospital Stay: Payer: Medicaid Other | Admitting: Oncology

## 2022-06-20 VITALS — BP 145/80 | HR 100 | Temp 98.1°F | Resp 18 | Ht 70.0 in | Wt 217.0 lb

## 2022-06-20 DIAGNOSIS — C7802 Secondary malignant neoplasm of left lung: Secondary | ICD-10-CM | POA: Insufficient documentation

## 2022-06-20 DIAGNOSIS — C187 Malignant neoplasm of sigmoid colon: Secondary | ICD-10-CM | POA: Insufficient documentation

## 2022-06-20 DIAGNOSIS — C787 Secondary malignant neoplasm of liver and intrahepatic bile duct: Secondary | ICD-10-CM

## 2022-06-20 DIAGNOSIS — C7801 Secondary malignant neoplasm of right lung: Secondary | ICD-10-CM | POA: Diagnosis not present

## 2022-06-20 DIAGNOSIS — C189 Malignant neoplasm of colon, unspecified: Secondary | ICD-10-CM

## 2022-06-20 DIAGNOSIS — Z95828 Presence of other vascular implants and grafts: Secondary | ICD-10-CM

## 2022-06-20 DIAGNOSIS — Z8616 Personal history of COVID-19: Secondary | ICD-10-CM | POA: Diagnosis not present

## 2022-06-20 LAB — CBC WITH DIFFERENTIAL (CANCER CENTER ONLY)
Abs Immature Granulocytes: 0.02 10*3/uL (ref 0.00–0.07)
Basophils Absolute: 0 10*3/uL (ref 0.0–0.1)
Basophils Relative: 0 %
Eosinophils Absolute: 0.1 10*3/uL (ref 0.0–0.5)
Eosinophils Relative: 2 %
HCT: 33.2 % — ABNORMAL LOW (ref 39.0–52.0)
Hemoglobin: 10.2 g/dL — ABNORMAL LOW (ref 13.0–17.0)
Immature Granulocytes: 0 %
Lymphocytes Relative: 38 %
Lymphs Abs: 2.7 10*3/uL (ref 0.7–4.0)
MCH: 29.9 pg (ref 26.0–34.0)
MCHC: 30.7 g/dL (ref 30.0–36.0)
MCV: 97.4 fL (ref 80.0–100.0)
Monocytes Absolute: 0.6 10*3/uL (ref 0.1–1.0)
Monocytes Relative: 8 %
Neutro Abs: 3.8 10*3/uL (ref 1.7–7.7)
Neutrophils Relative %: 52 %
Platelet Count: 149 10*3/uL — ABNORMAL LOW (ref 150–400)
RBC: 3.41 MIL/uL — ABNORMAL LOW (ref 4.22–5.81)
RDW: 15.9 % — ABNORMAL HIGH (ref 11.5–15.5)
WBC Count: 7.2 10*3/uL (ref 4.0–10.5)
nRBC: 0 % (ref 0.0–0.2)

## 2022-06-20 LAB — CEA (ACCESS): CEA (CHCC): 152.77 ng/mL — ABNORMAL HIGH (ref 0.00–5.00)

## 2022-06-20 LAB — CMP (CANCER CENTER ONLY)
ALT: 49 U/L — ABNORMAL HIGH (ref 0–44)
AST: 39 U/L (ref 15–41)
Albumin: 4 g/dL (ref 3.5–5.0)
Alkaline Phosphatase: 69 U/L (ref 38–126)
Anion gap: 12 (ref 5–15)
BUN: 7 mg/dL (ref 6–20)
CO2: 27 mmol/L (ref 22–32)
Calcium: 9.2 mg/dL (ref 8.9–10.3)
Chloride: 99 mmol/L (ref 98–111)
Creatinine: 1.02 mg/dL (ref 0.61–1.24)
GFR, Estimated: >60 mL/min (ref >60)
Glucose, Bld: 263 mg/dL — ABNORMAL HIGH (ref 70–99)
Potassium: 3.5 mmol/L (ref 3.5–5.1)
Sodium: 138 mmol/L (ref 135–145)
Total Bilirubin: 0.4 mg/dL (ref 0.3–1.2)
Total Protein: 7.5 g/dL (ref 6.5–8.1)

## 2022-06-20 LAB — TOTAL PROTEIN, URINE DIPSTICK: Protein, ur: NEGATIVE mg/dL

## 2022-06-20 MED ORDER — SODIUM CHLORIDE 0.9% FLUSH
10.0000 mL | INTRAVENOUS | Status: DC | PRN
  Administered 2022-06-20: 10 mL via INTRAVENOUS

## 2022-06-20 MED ORDER — HEPARIN SOD (PORK) LOCK FLUSH 100 UNIT/ML IV SOLN
500.0000 [IU] | Freq: Once | INTRAVENOUS | Status: AC | PRN
  Administered 2022-06-20: 500 [IU] via INTRAVENOUS

## 2022-06-20 NOTE — Progress Notes (Signed)
Placed referral for dietician at direction of MD for weight loss, diabetes and poor po intake due to dental work.

## 2022-06-20 NOTE — Progress Notes (Signed)
Jeff [name]   Diagnosis: Colon cancer  INTERVAL HISTORY:   Jeff [name] returns as scheduled.  He completed a cycle of fruquintinib beginning 05/24/2022.  No nausea or diarrhea.  He had multiple tooth extractions in early January.  He returned to the dentist to remove "bone fragments "and reports bleeding for 16 hours after this procedure.  No further bleeding.  He has chronic back pain.  Objective:  Vital signs in last 24 hours:  Blood pressure (!) 145/80, pulse 100, temperature 98.1 F (36.7 C), temperature source Oral, resp. rate 18, height '5\' 10"'$  (1.778 m), weight 217 lb (98.4 kg), SpO2 100 %.    HEENT: Edentulous with healing extraction sites at the upper and lower gumline, no bleeding Resp: Clear bilaterally Cardio: Regular rate and rhythm GI: No hepatosplenomegaly, nontender Vascular: No leg edema   Portacath/PICC-without erythema  Lab Results:  Lab Results  Component Value Date   WBC 7.2 06/20/2022   HGB 10.2 (L) 06/20/2022   HCT 33.2 (L) 06/20/2022   MCV 97.4 06/20/2022   PLT 149 (L) 06/20/2022   NEUTROABS 3.8 06/20/2022    CMP  Lab Results  Component Value Date   NA 138 06/20/2022   K 3.5 06/20/2022   CL 99 06/20/2022   CO2 27 06/20/2022   GLUCOSE 263 (H) 06/20/2022   BUN 7 06/20/2022   CREATININE 1.02 06/20/2022   CALCIUM 9.2 06/20/2022   PROT 7.5 06/20/2022   ALBUMIN 4.0 06/20/2022   AST 39 06/20/2022   ALT 49 (H) 06/20/2022   ALKPHOS 69 06/20/2022   BILITOT 0.4 06/20/2022   GFRNONAA >60 06/20/2022   GFRAA >60 01/30/2020    Lab Results  Component Value Date   CEA1 3.19 09/10/2020   CEA 152.77 (H) 06/20/2022    Lab Results  Component Value Date   INR 0.98 01/19/2016   LABPROT 13.0 01/19/2016    Imaging:  No results found.  Medications: I have reviewed the patient's current medications.   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,  biopsy confirmed adenocarcinoma         CTs chest, abdomen, and pelvis on 12/11/2015-indeterminate tiny pulmonary nodules, multiple liver metastases, small nodes in the sigmoid mesocolon Laparoscopic sigmoid colectomy, liver biopsy, Port-A-Cath placement 01/14/2016 Pathology sigmoid colon resection- colonic adenocarcinoma, 5.3 cm extending into pericolonic connective tissue, positive lymph-vascular invasion, positive perineural invasion, negative margins, metastatic carcinoma in 9 of 28 lymph nodes Pathology liver biopsy-metastatic colorectal adenocarcinoma MSI stable; mismatch repair protein normal APC alteration, K RAS wild-type, no BRAF mutation Cycle 1 FOLFOX 02/02/2016 Cycle 2 FOLFOX 02/15/2016 Cycle 3 FOLFOX 02/29/2016 Cycle 4 FOLFOX 03/14/2016 Cycle 5 FOLFOX 03/28/2016 Cycle 6 FOLFOX 04/11/2016 (oxaliplatin held secondary to thrombocytopenia) 04/21/2016 restaging CTs-difficulty evaluating liver lesions due to hepatic steatosis. Stable right adrenal nodule. No adenopathy or local recurrence near the rectosigmoid anastomotic site. Cycle 7 FOLFOX 04/25/2016 MRI liver 05/02/2016-partial improvement in hepatic metastases Cycle 8 FOLFOX 05/10/2016 Cycle 9 FOLFOX 05/23/2016 (oxaliplatin held due to thrombocytopenia) Cycle 10 FOLFOX 06/06/2016 (oxaliplatin dose reduced due to thrombocytopenia) Cycle 11 FOLFOX 06/27/2016 (oxaliplatin held due to neuropathy) Cycle 12 FOLFOX 07/11/2016 (oxaliplatin held) Initiation of maintenance Xeloda 7 days on/7 days off 07/27/2016 MRI liver 11/18/2016-decrease in hepatic metastatic disease. No new or progressive disease identified within the abdomen. Continuation of Xeloda 7 days on/7 days off MRI liver 04/27/2017-previous liver lesions not identified, no new lesions, no lymphadenopathy Xeloda continued 7 days on/7 days off  MRI liver 12/04/2017 - no evidence of metastatic disease, hepatic steatosis Xeloda continued 7 days on/7 days off MRI liver 07/15/2018-  no evidence of metastatic disease.  Stable severe hepatic steatosis. Xeloda continued 7 days on/7 days off MRI liver 03/16/2019-hepatic steatosis, no liver mass, focal area of intrahepatic biliary dilatation in segments 2 and 3 of the left lobe-increased Xeloda continued 7 days on/7 days off MRI abdomen 08/19/2019-no findings to suggest liver metastases.  Bilateral lung nodules measuring up to 2.8 cm, progressive and more conspicuous than on previous exam CT chest 08/29/2019-multiple pulmonary metastases, new from 04/21/2016 Cycle 1 FOLFIRI/bevacizumab 09/09/2019 Cycle 2 FOLFIRI/bevacizumab 09/26/2019  Cycle 3 FOLFIRI/bevacizumab 10/10/2019 Cycle 4 FOLFIRI/bevacizumab 10/24/2019 Cycle 5 FOLFIRI/bevacizumab 11/07/2019 CT chest 11/14/2019-decreased size of lung nodules, no new lesions, hepatic steatosis Cycle 6 FOLFIRI/bevacizumab 11/21/2019 Cycle 7 FOLFIRI/bevacizumab 12/05/2019 Cycle 8 FOLFIRI/bevacizumab 12/19/2019 Cycle 9 FOLFIRI/bevacizumab 01/02/2020 Cycle 10 FOLFIRI/bevacizumab 01/16/2020 CT chest 01/29/2020-stable bilateral pulmonary metastases.  No new or progressive metastatic disease in the chest. Cycle 11 FOLFIRI/bevacizumab 01/30/2020 Cycle 12 FOLFIRI/bevacizumab 02/19/2020 Cycle 13 FOLFIRI/bevacizumab 03/12/2020 Cycle 14 FOLFIRI/bevacizumab 04/02/2020 Cycle 15 FOLFIRI/bevacizumab 04/23/2020 Cycle 16 FOLFIRI/bevacizumab 05/21/2020 CT chest 06/09/2020-mild progression pulmonary metastasis.  Some lesions have increased in size while others are similar. Cycle 1 irinotecan/Panitumumab 06/18/2020 Cycle 2 irinotecan/Panitumumab 07/02/2020 Cycle 3 irinotecan/panitumumab 07/16/2020 Cycle 4 irinotecan/Panitumumab 07/30/2020 Cycle 5 irinotecan/Panitumumab 08/13/2020, Emend added Cycle 6 irinotecan/Panitumumab 08/27/2020 CT chest 09/08/2020-decreased size of pulmonary nodules, no progressive disease Cycle 7 irinotecan/panitumumab 09/02/2020 Cycle 8 irinotecan/panitumumab 09/24/2020 Cycle 9 irinotecan/Panitumumab  10/08/2020 10/22/2020 treatment held due to left foot fracture, need for surgery Cycle 10 irinotecan/panitumumab 11/24/2020 Cycle 11 irinotecan/Panitumumab 12/08/2020 Cycle 12 irinotecan/panitumumab 12/22/2020 Cycle 13 irinotecan/panitumumab 01/05/2021 01/20/2021 CT chest-mixed response with minimal increase in size of some lesions and minimal decrease in the size of other lesions.  Overall number of lesions is unchanged. Cycle 14 irinotecan/Panitumumab 02/05/2021 Cycle 15 irinotecan/Panitumumab 02/23/2021 Cycle 16 irinotecan/panitumumab 03/16/2021 Cycle 17 irinotecan/Panitumumab 04/13/2021 05/03/2021-CT chest-enlargement of pulmonary metastases, no new lesions 06/21/2021-cycle 1 Lonsurf/bevacizumab 07/19/2021-cycle 2 Lonsurf/bevacizumab 08/16/2021-cycle 3 Lonsurf/bevacizumab 09/09/2021 CT chest-bilateral lung nodules and masses with mixed response, overall stable to very minimally increased 09/13/2021 cycle 4 Lonsurf/bevacizumab 10/12/2021 cycle 5 Lonsurf/bevacizumab 10/21/2021 chest CT-bilateral pulmonary metastases without significant change.  No new or progressive disease within the chest. 11/08/2021 cycle 6 Lonsurf/bevacizumab 12/06/2021 cycle 7 Lonsurf/bevacizumab 01/03/2022 cycle 8 Lonsurf/bevacizumab CTs 01/25/2022-index lung lesion stable to mildly increased in size.  Mild retroperitoneal adenopathy, mildly increased in size compared to 12/16/2020. 02/01/2022 cycle 9 Lonsurf/bevacizumab 02/28/2022 cycle 10 Lonsurf/bevacizumab 03/28/2022 cycle 11 Lonsurf/bevacizumab CTs 04/21/2022-no change in bilateral pulmonary metastases, 2 new hypoattenuating liver lesions, new enlarged portacaval node Cycle 1 fruquintinib 05/24/2022 Cycle 2 fruquintinib t 06/21/2022   2.   Rectal bleeding and constipation secondary to #1   3.   History of peripheral vascular disease, status post left lower extremity vascular bypass surgery in April 2017   4.   History of nephrolithiasis   5.   History of Graves' disease treated with  radioactive iodine   6.   Anxiety/depression   7.   Hypertension   8.   Hospitalization 01/19/2016 with wound dehiscence status post secondary suture closure of abdominal wall   9.   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 Hyperglycemia 06/20/2016-diagnosed with diabetes, maintained on insulin   11.  Positive COVID test 12/13/2018; positive COVID test 01/25/2021   12. Hospitalized with seizure activity/DKA. Now on Keppra, insulin adjusted. Brain MRI 10/25/2019 with no  seizure etiology identified, no acute abnormality; EEG 10/25/2019 with evidence of epileptogenicity arising from right frontocentral region. 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  Left foot fracture-surgical repair 11/11/2020     Disposition: Mr. Fiorello completed 1 cycle of fruquintinib.  He tolerated the treatment well.  He will complete cycle 2 beginning on 06/21/2022.  He will return for an office and lab visit on 07/18/2022 with the plan to complete cycle 3 of fruquintinib beginning 07/19/2022.  He will be referred for restaging CTs after cycle 3.  Betsy Coder, MD  06/20/2022  9:28 AM

## 2022-06-21 ENCOUNTER — Ambulatory Visit (HOSPITAL_COMMUNITY)
Admission: RE | Admit: 2022-06-21 | Payer: Medicaid Other | Source: Ambulatory Visit | Attending: Cardiovascular Disease | Admitting: Cardiovascular Disease

## 2022-07-07 ENCOUNTER — Other Ambulatory Visit (HOSPITAL_COMMUNITY): Payer: Self-pay

## 2022-07-11 ENCOUNTER — Other Ambulatory Visit: Payer: Self-pay

## 2022-07-12 ENCOUNTER — Other Ambulatory Visit: Payer: Self-pay | Admitting: Oncology

## 2022-07-12 ENCOUNTER — Inpatient Hospital Stay: Payer: Medicaid Other | Admitting: Nutrition

## 2022-07-12 ENCOUNTER — Other Ambulatory Visit (HOSPITAL_COMMUNITY): Payer: Self-pay

## 2022-07-12 DIAGNOSIS — C187 Malignant neoplasm of sigmoid colon: Secondary | ICD-10-CM

## 2022-07-12 MED ORDER — FRUZAQLA 5 MG PO CAPS
1.0000 | ORAL_CAPSULE | Freq: Every day | ORAL | 0 refills | Status: DC
  Filled 2022-07-12: qty 21, 21d supply, fill #0

## 2022-07-12 NOTE — Progress Notes (Signed)
57 year old male diagnosed with metastatic colon cancer and followed by Dr. Benay Spice.  Patient is on fruquintinib.  Past medical history includes Graves' disease, nausea, depression, GERD, COVID-19, NSTEMI, hyperlipidemia, hypertension, and diabetes type 2.  Medications include Lipitor, Lomotil, Colace, NovoLog, Synthroid, Ativan, Glucophage XR, Remeron, Protonix, Phenergan, Effexor, and vitamin D.  Height: 5 feet 10 inches. Weight: 217 pounds documented on February 5. Patient weighed 222 pounds in November. BMI: 31.14.  Received consult due to patient's recent weight loss, diabetes, and inadequate oral intake secondary to multiple dental extractions.  I contacted patient's wife by telephone, per her request.  She reports patient's weight loss occurred directly after total extractions.  She thinks he is eating much better now.  She does want information on what nutrition drink would be appropriate with patient's diabetes.  Nutrition diagnosis: Unintended weight loss related to inadequate oral intake as evidenced by 5 pound weight loss.  Intervention: Educated on importance of no concentrated sweets diet.  Discouraged sugar filled beverages. Educated on soft, moist high-protein foods. Encouraged paring carbohydrates with a protein for better glycemic control. Recommended patient could try Glucerna if oral nutrition supplement required. Mailed nutrition facts sheets, Glucerna coupons, and contact information.  Questions answered.  Monitoring, evaluation, goals: Patient will tolerate adequate calories and protein for minimal weight loss.  Next visit: No follow-up scheduled at this time.  They have contact information for questions or concerns.  **Disclaimer: This note was dictated with voice recognition software. Similar sounding words can inadvertently be transcribed and this note may contain transcription errors which may not have been corrected upon publication of note.**

## 2022-07-13 ENCOUNTER — Other Ambulatory Visit: Payer: Self-pay

## 2022-07-18 ENCOUNTER — Inpatient Hospital Stay: Payer: Medicaid Other | Admitting: Nurse Practitioner

## 2022-07-18 ENCOUNTER — Inpatient Hospital Stay: Payer: Medicaid Other

## 2022-07-18 ENCOUNTER — Encounter: Payer: Self-pay | Admitting: Nurse Practitioner

## 2022-07-18 ENCOUNTER — Inpatient Hospital Stay: Payer: Medicaid Other | Attending: Oncology

## 2022-07-18 VITALS — BP 134/80 | HR 107 | Temp 97.9°F | Resp 18 | Ht 70.0 in | Wt 213.8 lb

## 2022-07-18 DIAGNOSIS — C7801 Secondary malignant neoplasm of right lung: Secondary | ICD-10-CM | POA: Insufficient documentation

## 2022-07-18 DIAGNOSIS — C787 Secondary malignant neoplasm of liver and intrahepatic bile duct: Secondary | ICD-10-CM | POA: Diagnosis not present

## 2022-07-18 DIAGNOSIS — E876 Hypokalemia: Secondary | ICD-10-CM | POA: Diagnosis not present

## 2022-07-18 DIAGNOSIS — C189 Malignant neoplasm of colon, unspecified: Secondary | ICD-10-CM

## 2022-07-18 DIAGNOSIS — C187 Malignant neoplasm of sigmoid colon: Secondary | ICD-10-CM | POA: Insufficient documentation

## 2022-07-18 DIAGNOSIS — C7802 Secondary malignant neoplasm of left lung: Secondary | ICD-10-CM | POA: Diagnosis not present

## 2022-07-18 LAB — CBC WITH DIFFERENTIAL (CANCER CENTER ONLY)
Abs Immature Granulocytes: 0.01 10*3/uL (ref 0.00–0.07)
Basophils Absolute: 0 10*3/uL (ref 0.0–0.1)
Basophils Relative: 0 %
Eosinophils Absolute: 0.1 10*3/uL (ref 0.0–0.5)
Eosinophils Relative: 1 %
HCT: 36.9 % — ABNORMAL LOW (ref 39.0–52.0)
Hemoglobin: 11.5 g/dL — ABNORMAL LOW (ref 13.0–17.0)
Immature Granulocytes: 0 %
Lymphocytes Relative: 42 %
Lymphs Abs: 3.1 10*3/uL (ref 0.7–4.0)
MCH: 29.5 pg (ref 26.0–34.0)
MCHC: 31.2 g/dL (ref 30.0–36.0)
MCV: 94.6 fL (ref 80.0–100.0)
Monocytes Absolute: 0.5 10*3/uL (ref 0.1–1.0)
Monocytes Relative: 7 %
Neutro Abs: 3.7 10*3/uL (ref 1.7–7.7)
Neutrophils Relative %: 50 %
Platelet Count: 118 10*3/uL — ABNORMAL LOW (ref 150–400)
RBC: 3.9 MIL/uL — ABNORMAL LOW (ref 4.22–5.81)
RDW: 15 % (ref 11.5–15.5)
WBC Count: 7.4 10*3/uL (ref 4.0–10.5)
nRBC: 0 % (ref 0.0–0.2)

## 2022-07-18 LAB — CMP (CANCER CENTER ONLY)
ALT: 46 U/L — ABNORMAL HIGH (ref 0–44)
AST: 47 U/L — ABNORMAL HIGH (ref 15–41)
Albumin: 3.8 g/dL (ref 3.5–5.0)
Alkaline Phosphatase: 66 U/L (ref 38–126)
Anion gap: 10 (ref 5–15)
BUN: 9 mg/dL (ref 6–20)
CO2: 27 mmol/L (ref 22–32)
Calcium: 8.7 mg/dL — ABNORMAL LOW (ref 8.9–10.3)
Chloride: 101 mmol/L (ref 98–111)
Creatinine: 1.24 mg/dL (ref 0.61–1.24)
GFR, Estimated: >60 mL/min (ref >60)
Glucose, Bld: 243 mg/dL — ABNORMAL HIGH (ref 70–99)
Potassium: 3.1 mmol/L — ABNORMAL LOW (ref 3.5–5.1)
Sodium: 138 mmol/L (ref 135–145)
Total Bilirubin: 0.4 mg/dL (ref 0.3–1.2)
Total Protein: 7.3 g/dL (ref 6.5–8.1)

## 2022-07-18 LAB — CEA (ACCESS): CEA (CHCC): 140.87 ng/mL — ABNORMAL HIGH (ref 0.00–5.00)

## 2022-07-18 NOTE — Progress Notes (Signed)
[place] OFFICE PROGRESS NOTE   Diagnosis: Colon cancer  INTERVAL HISTORY:   Jeff [name] returns as scheduled.  He completed cycle 2 fruquintinib beginning 06/21/2022.  He denies nausea/vomiting.  No mouth sores.  No diarrhea.  Intermittent constipation when he is taking fruquintinib.  No hand or foot pain or redness.  No rash.  Objective:  Vital signs in last 24 hours:  Blood pressure 134/80, pulse (!) 107, temperature 97.9 F (36.6 C), temperature source Oral, resp. rate 18, height '5\' 10"'$  (1.778 m), weight 213 lb 12.8 oz (97 kg), SpO2 100 %.    HEENT: No thrush or ulcers. Resp: Lungs clear bilaterally. Cardio: Regular rate and rhythm. GI: Abdomen thin nontender.  No hepatosplenomegaly.  No mass. Vascular: No leg edema. Neuro: Alert and oriented. Skin: Palms without erythema. Port-A-Cath without erythema.   Lab Results:  Lab Results  Component Value Date   WBC 7.4 07/18/2022   HGB 11.5 (L) 07/18/2022   HCT 36.9 (L) 07/18/2022   MCV 94.6 07/18/2022   PLT 118 (L) 07/18/2022   NEUTROABS 3.7 07/18/2022    Imaging:  No results found.  Medications: I have reviewed the patient's current medications.  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, biopsy confirmed adenocarcinoma         CTs chest, abdomen, and pelvis on 12/11/2015-indeterminate tiny pulmonary nodules, multiple liver metastases, small nodes in the sigmoid mesocolon Laparoscopic sigmoid colectomy, liver biopsy, Port-A-Cath placement 01/14/2016 Pathology sigmoid colon resection- colonic adenocarcinoma, 5.3 cm extending into pericolonic connective tissue, positive lymph-vascular invasion, positive perineural invasion, negative margins, metastatic carcinoma in 9 of 28 lymph nodes Pathology liver biopsy-metastatic colorectal adenocarcinoma MSI stable; mismatch repair protein normal APC alteration, K RAS wild-type, no BRAF  mutation Cycle 1 FOLFOX 02/02/2016 Cycle 2 FOLFOX 02/15/2016 Cycle 3 FOLFOX 02/29/2016 Cycle 4 FOLFOX 03/14/2016 Cycle 5 FOLFOX 03/28/2016 Cycle 6 FOLFOX 04/11/2016 (oxaliplatin held secondary to thrombocytopenia) 04/21/2016 restaging CTs-difficulty evaluating liver lesions due to hepatic steatosis. Stable right adrenal nodule. No adenopathy or local recurrence near the rectosigmoid anastomotic site. Cycle 7 FOLFOX 04/25/2016 MRI liver 05/02/2016-partial improvement in hepatic metastases Cycle 8 FOLFOX 05/10/2016 Cycle 9 FOLFOX 05/23/2016 (oxaliplatin held due to thrombocytopenia) Cycle 10 FOLFOX 06/06/2016 (oxaliplatin dose reduced due to thrombocytopenia) Cycle 11 FOLFOX 06/27/2016 (oxaliplatin held due to neuropathy) Cycle 12 FOLFOX 07/11/2016 (oxaliplatin held) Initiation of maintenance Xeloda 7 days on/7 days off 07/27/2016 MRI liver 11/18/2016-decrease in hepatic metastatic disease. No new or progressive disease identified within the abdomen. Continuation of Xeloda 7 days on/7 days off MRI liver 04/27/2017-previous liver lesions not identified, no new lesions, no lymphadenopathy Xeloda continued 7 days on/7 days off MRI liver 12/04/2017 - no evidence of metastatic disease, hepatic steatosis Xeloda continued 7 days on/7 days off MRI liver 07/15/2018- no evidence of metastatic disease.  Stable severe hepatic steatosis. Xeloda continued 7 days on/7 days off MRI liver 03/16/2019-hepatic steatosis, no liver mass, focal area of intrahepatic biliary dilatation in segments 2 and 3 of the left lobe-increased Xeloda continued 7 days on/7 days off MRI abdomen 08/19/2019-no findings to suggest liver metastases.  Bilateral lung nodules measuring up to 2.8 cm, progressive and more conspicuous than on previous exam CT chest 08/29/2019-multiple pulmonary metastases, new from 04/21/2016 Cycle 1 FOLFIRI/bevacizumab 09/09/2019 Cycle 2 FOLFIRI/bevacizumab 09/26/2019  Cycle 3 FOLFIRI/bevacizumab  10/10/2019 Cycle 4 FOLFIRI/bevacizumab 10/24/2019 Cycle 5 FOLFIRI/bevacizumab 11/07/2019 CT chest 11/14/2019-decreased size of lung nodules, no new lesions, hepatic steatosis Cycle 6 FOLFIRI/bevacizumab 11/21/2019 Cycle  7 FOLFIRI/bevacizumab 12/05/2019 Cycle 8 FOLFIRI/bevacizumab 12/19/2019 Cycle 9 FOLFIRI/bevacizumab 01/02/2020 Cycle 10 FOLFIRI/bevacizumab 01/16/2020 CT chest 01/29/2020-stable bilateral pulmonary metastases.  No new or progressive metastatic disease in the chest. Cycle 11 FOLFIRI/bevacizumab 01/30/2020 Cycle 12 FOLFIRI/bevacizumab 02/19/2020 Cycle 13 FOLFIRI/bevacizumab 03/12/2020 Cycle 14 FOLFIRI/bevacizumab 04/02/2020 Cycle 15 FOLFIRI/bevacizumab 04/23/2020 Cycle 16 FOLFIRI/bevacizumab 05/21/2020 CT chest 06/09/2020-mild progression pulmonary metastasis.  Some lesions have increased in size while others are similar. Cycle 1 irinotecan/Panitumumab 06/18/2020 Cycle 2 irinotecan/Panitumumab 07/02/2020 Cycle 3 irinotecan/panitumumab 07/16/2020 Cycle 4 irinotecan/Panitumumab 07/30/2020 Cycle 5 irinotecan/Panitumumab 08/13/2020, Emend added Cycle 6 irinotecan/Panitumumab 08/27/2020 CT chest 09/08/2020-decreased size of pulmonary nodules, no progressive disease Cycle 7 irinotecan/panitumumab 09/02/2020 Cycle 8 irinotecan/panitumumab 09/24/2020 Cycle 9 irinotecan/Panitumumab 10/08/2020 10/22/2020 treatment held due to left foot fracture, need for surgery Cycle 10 irinotecan/panitumumab 11/24/2020 Cycle 11 irinotecan/Panitumumab 12/08/2020 Cycle 12 irinotecan/panitumumab 12/22/2020 Cycle 13 irinotecan/panitumumab 01/05/2021 01/20/2021 CT chest-mixed response with minimal increase in size of some lesions and minimal decrease in the size of other lesions.  Overall number of lesions is unchanged. Cycle 14 irinotecan/Panitumumab 02/05/2021 Cycle 15 irinotecan/Panitumumab 02/23/2021 Cycle 16 irinotecan/panitumumab 03/16/2021 Cycle 17 irinotecan/Panitumumab 04/13/2021 05/03/2021-CT chest-enlargement of pulmonary  metastases, no new lesions 06/21/2021-cycle 1 Lonsurf/bevacizumab 07/19/2021-cycle 2 Lonsurf/bevacizumab 08/16/2021-cycle 3 Lonsurf/bevacizumab 09/09/2021 CT chest-bilateral lung nodules and masses with mixed response, overall stable to very minimally increased 09/13/2021 cycle 4 Lonsurf/bevacizumab 10/12/2021 cycle 5 Lonsurf/bevacizumab 10/21/2021 chest CT-bilateral pulmonary metastases without significant change.  No new or progressive disease within the chest. 11/08/2021 cycle 6 Lonsurf/bevacizumab 12/06/2021 cycle 7 Lonsurf/bevacizumab 01/03/2022 cycle 8 Lonsurf/bevacizumab CTs 01/25/2022-index lung lesion stable to mildly increased in size.  Mild retroperitoneal adenopathy, mildly increased in size compared to 12/16/2020. 02/01/2022 cycle 9 Lonsurf/bevacizumab 02/28/2022 cycle 10 Lonsurf/bevacizumab 03/28/2022 cycle 11 Lonsurf/bevacizumab CTs 04/21/2022-no change in bilateral pulmonary metastases, 2 new hypoattenuating liver lesions, new enlarged portacaval node Cycle 1 fruquintinib 05/24/2022 Cycle 2 fruquintinib 06/21/2022 Cycle 3 fruquintinib 07/19/2022   2.   Rectal bleeding and constipation secondary to #1   3.   History of peripheral vascular disease, status post left lower extremity vascular bypass surgery in April 2017   4.   History of nephrolithiasis   5.   History of Graves' disease treated with radioactive iodine   6.   Anxiety/depression   7.   Hypertension   8.   Hospitalization 01/19/2016 with wound dehiscence status post secondary suture closure of abdominal wall   9.   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 Hyperglycemia 06/20/2016-diagnosed with diabetes, maintained on insulin   11.  Positive COVID test 12/13/2018; positive COVID test 01/25/2021   12. Hospitalized with seizure activity/DKA. Now on Keppra, insulin adjusted. Brain MRI 10/25/2019 with no seizure etiology identified, no acute abnormality; EEG 10/25/2019 with  evidence of epileptogenicity arising from right frontocentral region. 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  Left foot fracture-surgical repair 11/11/2020    Disposition: Jeff [name] appears stable.  He has completed 2 cycles of fruquintinib, overall tolerating well.  He will begin cycle 3 on 07/19/2022.  Restaging CTs prior to next office visit.  CBC and chemistry panel reviewed.  Labs adequate to proceed as above.  He has hypokalemia.  He discontinued potassium following the last office visit.  He will resume Kdur 20 mEq daily.  He will be scheduled for restaging CT scans around 08/11/2022.  We will see him in follow-up on 08/15/2022.  He will contact the office in the interim with any problems.  Ned Card ANP/GNP-BC   07/18/2022  10:57 AM

## 2022-08-04 ENCOUNTER — Other Ambulatory Visit (HOSPITAL_COMMUNITY): Payer: Self-pay

## 2022-08-08 ENCOUNTER — Telehealth: Payer: Self-pay | Admitting: Oncology

## 2022-08-08 ENCOUNTER — Other Ambulatory Visit (HOSPITAL_COMMUNITY): Payer: Self-pay

## 2022-08-08 DIAGNOSIS — C187 Malignant neoplasm of sigmoid colon: Secondary | ICD-10-CM

## 2022-08-08 NOTE — Telephone Encounter (Signed)
Will await CT results from 3/28 prior to refill

## 2022-08-11 ENCOUNTER — Ambulatory Visit (HOSPITAL_BASED_OUTPATIENT_CLINIC_OR_DEPARTMENT_OTHER)
Admission: RE | Admit: 2022-08-11 | Discharge: 2022-08-11 | Disposition: A | Discharge status: Home / Self Care | Payer: Medicaid Other | Source: Ambulatory Visit | Attending: Nurse Practitioner | Admitting: Nurse Practitioner

## 2022-08-11 DIAGNOSIS — C189 Malignant neoplasm of colon, unspecified: Secondary | ICD-10-CM | POA: Insufficient documentation

## 2022-08-11 DIAGNOSIS — C787 Secondary malignant neoplasm of liver and intrahepatic bile duct: Secondary | ICD-10-CM | POA: Diagnosis present

## 2022-08-11 MED ORDER — IOHEXOL 300 MG/ML  SOLN
80.0000 mL | Freq: Once | INTRAMUSCULAR | Status: AC | PRN
  Administered 2022-08-11: 80 mL via INTRAVENOUS

## 2022-08-15 ENCOUNTER — Other Ambulatory Visit: Payer: Medicaid Other

## 2022-08-15 ENCOUNTER — Other Ambulatory Visit: Payer: Self-pay

## 2022-08-15 ENCOUNTER — Ambulatory Visit: Payer: Medicaid Other | Admitting: Oncology

## 2022-08-16 ENCOUNTER — Inpatient Hospital Stay: Payer: Medicaid Other | Admitting: Oncology

## 2022-08-16 ENCOUNTER — Other Ambulatory Visit (HOSPITAL_COMMUNITY): Payer: Self-pay

## 2022-08-16 ENCOUNTER — Inpatient Hospital Stay: Payer: Medicaid Other | Attending: Oncology

## 2022-08-16 ENCOUNTER — Other Ambulatory Visit: Payer: Self-pay

## 2022-08-16 ENCOUNTER — Other Ambulatory Visit: Payer: Self-pay | Admitting: *Deleted

## 2022-08-16 ENCOUNTER — Inpatient Hospital Stay: Payer: Medicaid Other

## 2022-08-16 VITALS — BP 141/80 | HR 90 | Temp 98.1°F | Resp 18 | Ht 70.0 in | Wt 216.2 lb

## 2022-08-16 DIAGNOSIS — Z95828 Presence of other vascular implants and grafts: Secondary | ICD-10-CM

## 2022-08-16 DIAGNOSIS — C187 Malignant neoplasm of sigmoid colon: Secondary | ICD-10-CM | POA: Insufficient documentation

## 2022-08-16 DIAGNOSIS — C787 Secondary malignant neoplasm of liver and intrahepatic bile duct: Secondary | ICD-10-CM | POA: Diagnosis present

## 2022-08-16 DIAGNOSIS — C189 Malignant neoplasm of colon, unspecified: Secondary | ICD-10-CM

## 2022-08-16 LAB — CBC WITH DIFFERENTIAL (CANCER CENTER ONLY)
Abs Immature Granulocytes: 0.02 10*3/uL (ref 0.00–0.07)
Basophils Absolute: 0 10*3/uL (ref 0.0–0.1)
Basophils Relative: 0 %
Eosinophils Absolute: 0.1 10*3/uL (ref 0.0–0.5)
Eosinophils Relative: 2 %
HCT: 35.9 % — ABNORMAL LOW (ref 39.0–52.0)
Hemoglobin: 11.4 g/dL — ABNORMAL LOW (ref 13.0–17.0)
Immature Granulocytes: 0 %
Lymphocytes Relative: 45 %
Lymphs Abs: 2.9 10*3/uL (ref 0.7–4.0)
MCH: 29.1 pg (ref 26.0–34.0)
MCHC: 31.8 g/dL (ref 30.0–36.0)
MCV: 91.6 fL (ref 80.0–100.0)
Monocytes Absolute: 0.5 10*3/uL (ref 0.1–1.0)
Monocytes Relative: 8 %
Neutro Abs: 2.9 10*3/uL (ref 1.7–7.7)
Neutrophils Relative %: 45 %
Platelet Count: 121 10*3/uL — ABNORMAL LOW (ref 150–400)
RBC: 3.92 MIL/uL — ABNORMAL LOW (ref 4.22–5.81)
RDW: 14.8 % (ref 11.5–15.5)
WBC Count: 6.5 10*3/uL (ref 4.0–10.5)
nRBC: 0 % (ref 0.0–0.2)

## 2022-08-16 LAB — CMP (CANCER CENTER ONLY)
ALT: 39 U/L (ref 0–44)
AST: 33 U/L (ref 15–41)
Albumin: 3.9 g/dL (ref 3.5–5.0)
Alkaline Phosphatase: 57 U/L (ref 38–126)
Anion gap: 11 (ref 5–15)
BUN: 10 mg/dL (ref 6–20)
CO2: 26 mmol/L (ref 22–32)
Calcium: 8.9 mg/dL (ref 8.9–10.3)
Chloride: 102 mmol/L (ref 98–111)
Creatinine: 0.87 mg/dL (ref 0.61–1.24)
GFR, Estimated: >60 mL/min (ref >60)
Glucose, Bld: 140 mg/dL — ABNORMAL HIGH (ref 70–99)
Potassium: 3.1 mmol/L — ABNORMAL LOW (ref 3.5–5.1)
Sodium: 139 mmol/L (ref 135–145)
Total Bilirubin: 0.4 mg/dL (ref 0.3–1.2)
Total Protein: 7 g/dL (ref 6.5–8.1)

## 2022-08-16 LAB — CEA (ACCESS): CEA (CHCC): 128.84 ng/mL — ABNORMAL HIGH (ref 0.00–5.00)

## 2022-08-16 MED ORDER — FRUQUINTINIB 5 MG PO CAPS
5.0000 mg | ORAL_CAPSULE | Freq: Every day | ORAL | 0 refills | Status: DC
Start: 2022-08-16 — End: 2022-09-07
  Filled 2022-08-16 (×2): qty 21, 21d supply, fill #0

## 2022-08-16 MED ORDER — SODIUM CHLORIDE 0.9% FLUSH
10.0000 mL | INTRAVENOUS | Status: DC | PRN
  Administered 2022-08-16: 10 mL via INTRAVENOUS

## 2022-08-16 MED ORDER — FRUQUINTINIB 5 MG PO CAPS
5.0000 mg | ORAL_CAPSULE | Freq: Every day | ORAL | 0 refills | Status: DC
Start: 2022-08-16 — End: 2022-08-16
  Filled 2022-08-16: qty 21, 21d supply, fill #0

## 2022-08-16 MED ORDER — HEPARIN SOD (PORK) LOCK FLUSH 100 UNIT/ML IV SOLN
500.0000 [IU] | Freq: Once | INTRAVENOUS | Status: AC | PRN
  Administered 2022-08-16: 500 [IU] via INTRAVENOUS

## 2022-08-16 NOTE — Progress Notes (Signed)
[place] OFFICE PROGRESS NOTE   Diagnosis: Colon cancer  INTERVAL HISTORY:   Jeff [name] completed another cycle of fruquintinib beginning 07/19/2022.  No nausea, diarrhea, or rash.  Good appetite.  He continues to have a problem with ill fitting dentures.  Objective:  Vital signs in last 24 hours:  Blood pressure (!) 141/80, pulse 90, temperature 98.1 F (36.7 C), temperature source Oral, resp. rate 18, height 5\' 10"  (1.778 m), weight 216 lb 3.2 oz (98.1 kg), SpO2 100 %.    HEENT: Edentulous, no thrush or ulcers Resp: Lungs clear bilaterally Cardio: Regular rate and rhythm GI: No hepatosplenomegaly Vascular: No leg edema Skin: Palms without erythema  Portacath/PICC-without erythema  Lab Results:  Lab Results  Component Value Date   WBC 6.5 08/16/2022   HGB 11.4 (L) 08/16/2022   HCT 35.9 (L) 08/16/2022   MCV 91.6 08/16/2022   PLT 121 (L) 08/16/2022   NEUTROABS 2.9 08/16/2022    CMP  Lab Results  Component Value Date   NA 139 08/16/2022   K 3.1 (L) 08/16/2022   CL 102 08/16/2022   CO2 26 08/16/2022   GLUCOSE 140 (H) 08/16/2022   BUN 10 08/16/2022   CREATININE 0.87 08/16/2022   CALCIUM 8.9 08/16/2022   PROT 7.0 08/16/2022   ALBUMIN 3.9 08/16/2022   AST 33 08/16/2022   ALT 39 08/16/2022   ALKPHOS 57 08/16/2022   BILITOT 0.4 08/16/2022   GFRNONAA >60 08/16/2022   GFRAA >60 01/30/2020    Lab Results  Component Value Date   CEA1 3.19 09/10/2020   CEA 128.84 (H) 08/16/2022    Medications: I have reviewed the patient's current medications.   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, biopsy confirmed adenocarcinoma         CTs chest, abdomen, and pelvis on 12/11/2015-indeterminate tiny pulmonary nodules, multiple liver metastases, small nodes in the sigmoid mesocolon Laparoscopic sigmoid colectomy, liver biopsy, Port-A-Cath placement 01/14/2016 Pathology sigmoid colon  resection- colonic adenocarcinoma, 5.3 cm extending into pericolonic connective tissue, positive lymph-vascular invasion, positive perineural invasion, negative margins, metastatic carcinoma in 9 of 28 lymph nodes Pathology liver biopsy-metastatic colorectal adenocarcinoma MSI stable; mismatch repair protein normal APC alteration, K RAS wild-type, no BRAF mutation Cycle 1 FOLFOX 02/02/2016 Cycle 2 FOLFOX 02/15/2016 Cycle 3 FOLFOX 02/29/2016 Cycle 4 FOLFOX 03/14/2016 Cycle 5 FOLFOX 03/28/2016 Cycle 6 FOLFOX 04/11/2016 (oxaliplatin held secondary to thrombocytopenia) 04/21/2016 restaging CTs-difficulty evaluating liver lesions due to hepatic steatosis. Stable right adrenal nodule. No adenopathy or local recurrence near the rectosigmoid anastomotic site. Cycle 7 FOLFOX 04/25/2016 MRI liver 05/02/2016-partial improvement in hepatic metastases Cycle 8 FOLFOX 05/10/2016 Cycle 9 FOLFOX 05/23/2016 (oxaliplatin held due to thrombocytopenia) Cycle 10 FOLFOX 06/06/2016 (oxaliplatin dose reduced due to thrombocytopenia) Cycle 11 FOLFOX 06/27/2016 (oxaliplatin held due to neuropathy) Cycle 12 FOLFOX 07/11/2016 (oxaliplatin held) Initiation of maintenance Xeloda 7 days on/7 days off 07/27/2016 MRI liver 11/18/2016-decrease in hepatic metastatic disease. No new or progressive disease identified within the abdomen. Continuation of Xeloda 7 days on/7 days off MRI liver 04/27/2017-previous liver lesions not identified, no new lesions, no lymphadenopathy Xeloda continued 7 days on/7 days off MRI liver 12/04/2017 - no evidence of metastatic disease, hepatic steatosis Xeloda continued 7 days on/7 days off MRI liver 07/15/2018- no evidence of metastatic disease.  Stable severe hepatic steatosis. Xeloda continued 7 days on/7 days off MRI liver 03/16/2019-hepatic steatosis, no liver mass, focal area of intrahepatic biliary dilatation in segments 2 and 3  of the left lobe-increased Xeloda continued 7 days on/7 days  off MRI abdomen 08/19/2019-no findings to suggest liver metastases.  Bilateral lung nodules measuring up to 2.8 cm, progressive and more conspicuous than on previous exam CT chest 08/29/2019-multiple pulmonary metastases, new from 04/21/2016 Cycle 1 FOLFIRI/bevacizumab 09/09/2019 Cycle 2 FOLFIRI/bevacizumab 09/26/2019  Cycle 3 FOLFIRI/bevacizumab 10/10/2019 Cycle 4 FOLFIRI/bevacizumab 10/24/2019 Cycle 5 FOLFIRI/bevacizumab 11/07/2019 CT chest 11/14/2019-decreased size of lung nodules, no new lesions, hepatic steatosis Cycle 6 FOLFIRI/bevacizumab 11/21/2019 Cycle 7 FOLFIRI/bevacizumab 12/05/2019 Cycle 8 FOLFIRI/bevacizumab 12/19/2019 Cycle 9 FOLFIRI/bevacizumab 01/02/2020 Cycle 10 FOLFIRI/bevacizumab 01/16/2020 CT chest 01/29/2020-stable bilateral pulmonary metastases.  No new or progressive metastatic disease in the chest. Cycle 11 FOLFIRI/bevacizumab 01/30/2020 Cycle 12 FOLFIRI/bevacizumab 02/19/2020 Cycle 13 FOLFIRI/bevacizumab 03/12/2020 Cycle 14 FOLFIRI/bevacizumab 04/02/2020 Cycle 15 FOLFIRI/bevacizumab 04/23/2020 Cycle 16 FOLFIRI/bevacizumab 05/21/2020 CT chest 06/09/2020-mild progression pulmonary metastasis.  Some lesions have increased in size while others are similar. Cycle 1 irinotecan/Panitumumab 06/18/2020 Cycle 2 irinotecan/Panitumumab 07/02/2020 Cycle 3 irinotecan/panitumumab 07/16/2020 Cycle 4 irinotecan/Panitumumab 07/30/2020 Cycle 5 irinotecan/Panitumumab 08/13/2020, Emend added Cycle 6 irinotecan/Panitumumab 08/27/2020 CT chest 09/08/2020-decreased size of pulmonary nodules, no progressive disease Cycle 7 irinotecan/panitumumab 09/02/2020 Cycle 8 irinotecan/panitumumab 09/24/2020 Cycle 9 irinotecan/Panitumumab 10/08/2020 10/22/2020 treatment held due to left foot fracture, need for surgery Cycle 10 irinotecan/panitumumab 11/24/2020 Cycle 11 irinotecan/Panitumumab 12/08/2020 Cycle 12 irinotecan/panitumumab 12/22/2020 Cycle 13 irinotecan/panitumumab 01/05/2021 01/20/2021 CT chest-mixed response with minimal  increase in size of some lesions and minimal decrease in the size of other lesions.  Overall number of lesions is unchanged. Cycle 14 irinotecan/Panitumumab 02/05/2021 Cycle 15 irinotecan/Panitumumab 02/23/2021 Cycle 16 irinotecan/panitumumab 03/16/2021 Cycle 17 irinotecan/Panitumumab 04/13/2021 05/03/2021-CT chest-enlargement of pulmonary metastases, no new lesions 06/21/2021-cycle 1 Lonsurf/bevacizumab 07/19/2021-cycle 2 Lonsurf/bevacizumab 08/16/2021-cycle 3 Lonsurf/bevacizumab 09/09/2021 CT chest-bilateral lung nodules and masses with mixed response, overall stable to very minimally increased 09/13/2021 cycle 4 Lonsurf/bevacizumab 10/12/2021 cycle 5 Lonsurf/bevacizumab 10/21/2021 chest CT-bilateral pulmonary metastases without significant change.  No new or progressive disease within the chest. 11/08/2021 cycle 6 Lonsurf/bevacizumab 12/06/2021 cycle 7 Lonsurf/bevacizumab 01/03/2022 cycle 8 Lonsurf/bevacizumab CTs 01/25/2022-index lung lesion stable to mildly increased in size.  Mild retroperitoneal adenopathy, mildly increased in size compared to 12/16/2020. 02/01/2022 cycle 9 Lonsurf/bevacizumab 02/28/2022 cycle 10 Lonsurf/bevacizumab 03/28/2022 cycle 11 Lonsurf/bevacizumab CTs 04/21/2022-no change in bilateral pulmonary metastases, 2 new hypoattenuating liver lesions, new enlarged portacaval node Cycle 1 fruquintinib 05/24/2022 Cycle 2 fruquintinib 06/21/2022 Cycle 3 fruquintinib 07/19/2022 CTs 08/12/2022-stable lung nodules, hilar and retroperitoneal nodes, slight increase in size of liver metastases, stable right adrenal nodule Cycle 4 fruquintinib 08/16/2022   2.   Rectal bleeding and constipation secondary to #1   3.   History of peripheral vascular disease, status post left lower extremity vascular bypass surgery in April 2017   4.   History of nephrolithiasis   5.   History of Graves' disease treated with radioactive iodine   6.   Anxiety/depression   7.   Hypertension   8.   Hospitalization  01/19/2016 with wound dehiscence status post secondary suture closure of abdominal wall   9.   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 Hyperglycemia 06/20/2016-diagnosed with diabetes, maintained on insulin   11.  Positive COVID test 12/13/2018; positive COVID test 01/25/2021   12. Hospitalized with seizure activity/DKA. Now on Keppra, insulin adjusted. 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  Left  foot fracture-surgical repair 11/11/2020     Disposition: Jeff [name] appears stable.  He is completed 3 cycles of fruquintinib.  His clinical status is unchanged.  The CEA is lower.  I reviewed the restaging CT findings and images with Jeff [name].  There are no apparent new sites of metastatic disease.  The lung nodules appear unchanged.  Some of the liver lesions appear slightly larger. Systemic treatment options are limited.  I recommend continuing fruquintinib.  He agrees.  He will complete another cycle beginning today.  He will return for an office and lab visit in 4 weeks.  Betsy Coder, MD  08/16/2022  5:28 PM

## 2022-08-17 ENCOUNTER — Other Ambulatory Visit: Payer: Self-pay

## 2022-08-17 DIAGNOSIS — C187 Malignant neoplasm of sigmoid colon: Secondary | ICD-10-CM

## 2022-08-17 DIAGNOSIS — C189 Malignant neoplasm of colon, unspecified: Secondary | ICD-10-CM

## 2022-08-17 LAB — MAGNESIUM: Magnesium: 1.4 mg/dL — ABNORMAL LOW (ref 1.7–2.4)

## 2022-08-22 ENCOUNTER — Inpatient Hospital Stay: Payer: Medicaid Other

## 2022-08-22 DIAGNOSIS — C189 Malignant neoplasm of colon, unspecified: Secondary | ICD-10-CM

## 2022-08-22 DIAGNOSIS — C187 Malignant neoplasm of sigmoid colon: Secondary | ICD-10-CM | POA: Diagnosis not present

## 2022-08-22 LAB — BASIC METABOLIC PANEL - CANCER CENTER ONLY
Anion gap: 11 (ref 5–15)
BUN: 5 mg/dL — ABNORMAL LOW (ref 6–20)
CO2: 29 mmol/L (ref 22–32)
Calcium: 9.4 mg/dL (ref 8.9–10.3)
Chloride: 99 mmol/L (ref 98–111)
Creatinine: 0.87 mg/dL (ref 0.61–1.24)
GFR, Estimated: >60 mL/min (ref >60)
Glucose, Bld: 186 mg/dL — ABNORMAL HIGH (ref 70–99)
Potassium: 3.3 mmol/L — ABNORMAL LOW (ref 3.5–5.1)
Sodium: 139 mmol/L (ref 135–145)

## 2022-08-22 LAB — MAGNESIUM: Magnesium: 1.3 mg/dL — ABNORMAL LOW (ref 1.7–2.4)

## 2022-08-23 ENCOUNTER — Telehealth: Payer: Self-pay | Admitting: Oncology

## 2022-08-23 ENCOUNTER — Telehealth: Payer: Self-pay

## 2022-08-23 ENCOUNTER — Other Ambulatory Visit: Payer: Self-pay | Admitting: *Deleted

## 2022-08-23 NOTE — Telephone Encounter (Signed)
Contacted patient to scheduled appointments. Patient is aware of appointments that are scheduled.   

## 2022-08-23 NOTE — Progress Notes (Signed)
FMLA for [employer] completed and faxed with confirmation received.   Fax no. [phone]

## 2022-08-23 NOTE — Telephone Encounter (Signed)
I informed the patient that his levels of calcium and magnesium are still low. Please schedule an appointment for him to receive 3g of IV magnesium this week and continue taking daily potassium chloride. The patient has agreed to come in for the IV magnesium treatment. Upon arrival, he will be administered 4g of IV magnesium as per Dr. Kalman Drape instructions. The patient is already aware of his upcoming appointment.

## 2022-08-24 ENCOUNTER — Inpatient Hospital Stay: Payer: Medicaid Other

## 2022-08-24 VITALS — BP 148/92 | HR 100 | Temp 97.6°F | Resp 20 | Ht 70.0 in | Wt 216.2 lb

## 2022-08-24 DIAGNOSIS — C187 Malignant neoplasm of sigmoid colon: Secondary | ICD-10-CM

## 2022-08-24 DIAGNOSIS — Z95828 Presence of other vascular implants and grafts: Secondary | ICD-10-CM

## 2022-08-24 MED ORDER — HEPARIN SOD (PORK) LOCK FLUSH 100 UNIT/ML IV SOLN
500.0000 [IU] | Freq: Once | INTRAVENOUS | Status: AC
  Administered 2022-08-24: 500 [IU] via INTRAVENOUS

## 2022-08-24 MED ORDER — SODIUM CHLORIDE 0.9% FLUSH
10.0000 mL | Freq: Once | INTRAVENOUS | Status: AC
  Administered 2022-08-24: 10 mL via INTRAVENOUS

## 2022-08-24 MED ORDER — SODIUM CHLORIDE 0.9 % IV SOLN: INTRAVENOUS | Status: DC

## 2022-08-24 MED ORDER — MAGNESIUM SULFATE 4 GM/100ML IV SOLN
4.0000 g | Freq: Once | INTRAVENOUS | Status: AC
  Administered 2022-08-24: 4 g via INTRAVENOUS
  Filled 2022-08-24: qty 100

## 2022-08-24 NOTE — Patient Instructions (Signed)
Hypomagnesemia Hypomagnesemia is a condition in which the level of magnesium in the blood is too low. Magnesium is a mineral that is found in many foods. It is used in many different processes in the body. Hypomagnesemia can affect every organ in the body. In severe cases, it can cause life-threatening problems. What are the causes? This condition may be caused by: Not getting enough magnesium in your diet or not having enough healthy foods to eat (malnutrition). Problems with magnesium absorption in the intestines. Dehydration. Excessive use of alcohol. Vomiting. Severe or long-term (chronic) diarrhea. Some medicines, including medicines that make you urinate more often (diuretics). Certain diseases, such as kidney disease, diabetes, celiac disease, and overactive thyroid. What are the signs or symptoms? Symptoms of this condition include: Loss of appetite, nausea, and vomiting. Involuntary shaking or trembling of a body part (tremor). Muscle weakness or tingling in the arms and legs. Sudden tightening of muscles (muscle spasms). Confusion. Psychiatric issues, such as: Depression and irritability. Psychosis. A feeling of fluttering of the heart (palpitations). Seizures. These symptoms are more severe if magnesium levels drop suddenly. How is this diagnosed? This condition may be diagnosed based on: Your symptoms and medical history. A physical exam. Blood and urine tests. How is this treated? Treatment depends on the cause and the severity of the condition. It may be treated by: Taking a magnesium supplement. This can be taken in pill form. If the condition is severe, magnesium is usually given through an IV. Making changes to your diet. You may be directed to eat foods that have a lot of magnesium, such as green leafy vegetables, peas, beans, and nuts. Not drinking alcohol. If you are struggling not to drink, ask your health care provider for help. Follow these instructions at  home: Eating and drinking     Make sure that your diet includes foods with magnesium. Foods that have a lot of magnesium in them include: Green leafy vegetables, such as spinach and broccoli. Beans and peas. Nuts and seeds, such as almonds and sunflower seeds. Whole grains, such as whole grain bread and fortified cereals. Drink fluids that contain salts and minerals (electrolytes), such as sports drinks, when you are active. Do not drink alcohol. General instructions Take over-the-counter and prescription medicines only as told by your health care provider. Take magnesium supplements as directed if your health care provider tells you to take them. Have your magnesium levels monitored as told by your health care provider. Keep all follow-up visits. This is important. Contact a health care provider if: You get worse instead of better. Your symptoms return. Get help right away if: You develop severe muscle weakness. You have trouble breathing. You feel that your heart is racing. These symptoms may represent a serious problem that is an emergency. Do not wait to see if the symptoms will go away. Get medical help right away. Call your local emergency services (911 in the [place] Do not drive yourself to the hospital. Summary Hypomagnesemia is a condition in which the level of magnesium in the blood is too low. Hypomagnesemia can affect every organ in the body. Treatment may include eating more foods that contain magnesium, taking magnesium supplements, and not drinking alcohol. Have your magnesium levels monitored as told by your health care provider. This information is not intended to replace advice given to you by your health care provider. Make sure you discuss any questions you have with your health care provider. Document Revised: 09/29/2020 Document Reviewed: 09/29/2020 [employer] Patient Education    2023 [employer]  

## 2022-08-29 ENCOUNTER — Other Ambulatory Visit: Payer: Self-pay | Admitting: Oncology

## 2022-08-29 DIAGNOSIS — C189 Malignant neoplasm of colon, unspecified: Secondary | ICD-10-CM

## 2022-09-01 ENCOUNTER — Other Ambulatory Visit (HOSPITAL_COMMUNITY): Payer: Self-pay

## 2022-09-05 ENCOUNTER — Other Ambulatory Visit (HOSPITAL_COMMUNITY): Payer: Self-pay

## 2022-09-07 ENCOUNTER — Other Ambulatory Visit (HOSPITAL_COMMUNITY): Payer: Self-pay

## 2022-09-07 ENCOUNTER — Other Ambulatory Visit: Payer: Self-pay | Admitting: Oncology

## 2022-09-07 ENCOUNTER — Other Ambulatory Visit: Payer: Self-pay

## 2022-09-07 ENCOUNTER — Encounter: Payer: Self-pay | Admitting: Neurology

## 2022-09-07 ENCOUNTER — Ambulatory Visit: Payer: Medicaid Other | Admitting: Neurology

## 2022-09-07 VITALS — BP 134/75 | HR 93 | Ht 70.0 in | Wt 215.0 lb

## 2022-09-07 DIAGNOSIS — C187 Malignant neoplasm of sigmoid colon: Secondary | ICD-10-CM

## 2022-09-07 DIAGNOSIS — G40309 Generalized idiopathic epilepsy and epileptic syndromes, not intractable, without status epilepticus: Secondary | ICD-10-CM

## 2022-09-07 MED ORDER — FRUQUINTINIB 5 MG PO CAPS
5.0000 mg | ORAL_CAPSULE | Freq: Every day | ORAL | 0 refills | Status: DC
Start: 2022-09-07 — End: 2022-10-05
  Filled 2022-09-07: qty 21, 21d supply, fill #0

## 2022-09-07 MED ORDER — LEVETIRACETAM 750 MG PO TABS: ORAL_TABLET | ORAL | 3 refills | Status: DC

## 2022-09-07 NOTE — Progress Notes (Signed)
NEUROLOGY FOLLOW UP OFFICE NOTE  CHUCKIE MCCATHERN [id number] Nov 04, 1965  HISTORY OF PRESENT ILLNESS: I had the pleasure of seeing Richy Spradley in follow-up in the neurology clinic on 09/07/2022.  The patient was last seen a year ago for seizures. He is again accompanied by his wife who helps supplement the history today.  Records and images were personally reviewed where available. His wife called our office to report a seizure on 12/18/2021 and that he was not taking his medications as supposed to. He denies any prior warning symptoms, he was coming out of a building then woke up in the ER. A witness who was a nurse reported he had a seizure but did not describe it. He broke his teeth and now has dentures. He states he had missed several days of medications because he was doing so well. Prior to this seizure, he had been seizure-free for almost 2 years. He is on Levetiracetam 750mg  BID without side effects. They deny any staring/unresponsive episodes, gaps in time, olfactory/gustatory hallucinations, focal numbness/tingling/weakness, myoclonic jerks. No headaches, dizziness, vision changes, no other falls. Sugar levels have come down, they have been good per wife. Sleep is okay, he is not using his CPAP. Mood is always good.    History on Initial Assessment 11/14/2019: This is a 57 year old right-handed man with a history of hypertension, diabetes, colon cancer on chemotherapy, Graves disease s/p radioactive iodine, PVD, s/p bypass graft, chronic thrombocytopenia, presenting for evaluation of new onset seizure last 10/25/2019. He received chemotherapy then later that evening he lay down at bed then came to the kitchen twice, acting unusually. His wife reports he opened his chemo pouch and was messing with his port. She kept reminding him to stop touching it, he said "yeah," and she asked if it was hurting. He went back to the bedroom and she said good night, then heard a noise and found him unresponsive. He  started yelling, both hands were flexed to his face that he scratched his face. He was turned to his right side and confused after. The seizure lasted a few minutes. He had 2 more seizures witnessed by EMS and was given Versed. He was initially brought to [facility] where glucose level was 566, he was hypotensive with SBP in the 80s and transferred to [facility]. He had an MRI brain which did not show any acute changes. His EEG showed intermittent generalized 4-5 Hz theta slowing with frequent spikes over the right frontocentral region, maximal at F4. He was discharged home on Levetiracetam 500mg  BID, he denies any side effects. He and his wife deny any prior history of seizures but recall 2 episodes of loss of consciousness, he was in thyroid storm one time. Another time 11 years ago he lost his memory, he was going to [facility] but ended up going to [place], he passed out and then apparently walked out of the hospital, they found him on a train track. His ammonia level was high that time. She denies any staring/unresponsive episodes. He denies any gaps in time. Sometimes he smells Clorox or hand sanitizer. He has been very sensitive to smells, smelling Clorox since diagnosed with cancer. He has hypnic jerks in sleep that wake him up, none during the day.   Since the seizure, he has had bilateral shoulder/arm pain, with sharp pains when he tries to lift them up. His glucose levels are better with insulin dose changes. He has paresthesias in both feet. He has occasional headaches but  has been complaining of them more recently, with pain over the temples. He has nausea/vomiting with chemotherapy. He has occasional sensation of food getting stuck in his throat. He has chronic back pain. No bowel/bladder dysfunction. Memory is okay. He denies any alcohol use. He has sleep difficulties, melatonin does not help. He had a normal birth and early development.  There is no history of febrile convulsions, CNS  infections such as meningitis/encephalitis, significant traumatic brain injury, neurosurgical procedures, or family history of seizures.  Diagnostic Data: MRI brain in 10/2019 did not show any acute changes.  EEG in 10/2019 showed intermittent generalized 4-5 Hz theta slowing with frequent spikes over the right frontocentral region, maximal at F4 24-hour EEG in 04/2020 was abnormal with occasional generalized 4-5 Hz spike and wave discharges consistent with a primary generalized epilepsy.   PAST MEDICAL HISTORY: Past Medical History:  Diagnosis Date   Allergic rhinitis    Anxiety    Arthritis    Chemotherapy-induced thrombocytopenia    Chronic nausea    mild intermittant and loose stools due to colon cancer   Chronic neck and back pain    Depression    ED (erectile dysfunction)    Generalized idiopathic epilepsy and epileptic syndromes, not intractable, without status epilepticus 10/25/2019   (11-10-2020 per pt wife last seizure 11/ 2021)  neurologist-- dr Karel Jarvis---  seizure activity/ DKA ;  EEG w/ evidence epileto genicity arising right frontocentral region;  taking keppra   GERD (gastroesophageal reflux disease)    Hiatal hernia    History of 2019 novel coronavirus disease (COVID-19) 12/13/2018   per pt wife , pt had moderate symtptoms that resolved   History of Graves' disease    s/p RAI 02/ 2012;  previous seen by endocrinologist--- dr Everardo All   History of kidney stones    History of non-ST elevation myocardial infarction (NSTEMI)    Jan 2014--  no CAD;  per notes probable coronary vasospasm   History of panic attacks    History of thyroid storm    Nov 2011 w/ grave's ,  s/p RAI 02/ 2012   Hyperlipidemia    Hypertension    Hypothyroidism following radioiodine therapy    RAI in 02/  2012---  previously followed by dr Everardo All,  now followed by pcp   Lower urinary tract symptoms (LUTS)    Metastatic colon cancer to liver 11/2015   oncologist-- [facility]/ and seen by dr a.  Maisie Fus;  01-14-2016 s/p sigmoid colectomy, liver bx, node dissection, PAC;   chemo started 09/ 2017  ;  04/ 2021 metastatic pulmonary nodules;   active treatment every 2 wks   Nephrolithiasis    left side non-obstructive per imaging 04/ 2022   PAD (peripheral artery disease) cardiologist-  dr Kirke Corin   cardiologist ----  dr Judie Petit. Kirke Corin ;   left popliteal occlusion behind knee w/ collaterals;  s/p attempted angioplasty 01/ 2016;  09-11-2015 s/p pop-TPT w/ ipsNR GSV   PONV (postoperative nausea and vomiting)    Rash    due to chemo   Type 2 diabetes mellitus treated with insulin    followed by pcp----   (11-10-2020  per pt wife blood sugar check's 2-4 times daily,  fasting sugar--- 130--168)   Unspecified fracture of left foot, initial encounter for closed fracture 10/17/2020   midfoot    MEDICATIONS: Current Outpatient Medications on File Prior to Visit  Medication Sig Dispense Refill   ACCU-CHEK GUIDE test strip CHECK BLOOD SUGAR THREE TIMES DAILY AS  DIRECTED     ACCU-CHEK SOFTCLIX LANCETS lancets USE  TID TO CHECK BLOOD SUGAR LEVELS  0   amLODipine (NORVASC) 10 MG tablet Take 1 tablet (10 mg total) by mouth daily. 90 tablet 3   aspirin EC 81 MG tablet Take 81 mg by mouth daily.     atorvastatin (LIPITOR) 80 MG tablet Take 1 tablet (80 mg total) by mouth daily. TAKE 1 TABLET(80 MG) BY MOUTH AT BEDTIME 90 tablet 3   clonazePAM (KLONOPIN) 1 MG tablet Take 1 mg by mouth 2 (two) times daily as needed.     dapagliflozin propanediol (FARXIGA) 10 MG TABS tablet Take 10 mg by mouth daily.     diphenoxylate-atropine (LOMOTIL) 2.5-0.025 MG tablet Take 1-2 tablets by mouth 4 (four) times daily as needed for diarrhea or loose stools. 40 tablet 0   docusate sodium (COLACE) 100 MG capsule Take 100 mg by mouth 2 (two) times daily.     fruquintinib (FRUZAQLA) 5 MG capsule Take 1 capsule (5 mg total) by mouth daily. Take for 21 days, then hold for 7 days. Repeat every 28 days. Start cycle on 08/16/22 21 capsule 0    GLOBAL EASE INJECT PEN NEEDLES 31G X 5 MM MISC Inject 1 Syringe into the skin 4 (four) times daily.     hydrochlorothiazide (MICROZIDE) 12.5 MG capsule Take 12.5 mg by mouth daily.     insulin aspart (NOVOLOG) 100 UNIT/ML injection Inject 12 Units into the skin 3 (three) times daily before meals. (Patient taking differently: Inject 18 Units into the skin as needed.) 12 mL 0   Insulin Degludec (TRESIBA FLEXTOUCH [place]) Inject 80 Units into the skin daily.     levETIRAcetam (KEPPRA) 750 MG tablet Take 1 tablet twice a day 180 tablet 3   levothyroxine (SYNTHROID) 137 MCG tablet Take 137 mcg by mouth daily before breakfast.     lidocaine-prilocaine (EMLA) cream Apply 1 Application topically as needed. Apply to portacath site 1 hour prior to use 30 g 3   LORazepam (ATIVAN) 0.5 MG tablet Take 1 tablet (0.5 mg total) by mouth every 8 (eight) hours as needed for anxiety. 30 tablet 0   meclizine (ANTIVERT) 25 MG tablet Take 25 mg by mouth 3 (three) times daily as needed for dizziness.  1   metFORMIN (GLUCOPHAGE-XR) 500 MG 24 hr tablet Take 1,000 mg by mouth in the morning and at bedtime.  3   metoprolol tartrate (LOPRESSOR) 50 MG tablet Take 1.5 tablets (75 mg total) by mouth 2 (two) times daily. (Patient taking differently: Take 100 mg by mouth 2 (two) times daily.) 270 tablet 3   mirtazapine (REMERON) 15 MG tablet Take 15 mg by mouth at bedtime.     nitroGLYCERIN (NITROSTAT) 0.4 MG SL tablet DISSOLVE 1 TABLET UNDER TONGUE EVERY 5 MINUTES AS NEEDED FOR CHEST PAIN. IF NO RELIEF AFTER 3 DOSES CALL 911. 25 tablet 5   ondansetron (ZOFRAN) 8 MG tablet Take 1 tablet (8 mg total) by mouth every 8 (eight) hours as needed for nausea or vomiting. 30 tablet 1   oxyCODONE ER (XTAMPZA ER) 13.5 MG C12A Take 13.5 mg by mouth every 12 (twelve) hours.     oxyCODONE-acetaminophen (PERCOCET) 10-325 MG tablet Take 1 tablet by mouth every 6 (six) hours as needed.     pantoprazole (PROTONIX) 40 MG tablet Take 40 mg by mouth 2  (two) times daily.  5   potassium chloride SA (KLOR-CON M) 20 MEQ tablet TAKE 1 TABLET BY MOUTH DAILY.  30 tablet 2   promethazine (PHENERGAN) 12.5 MG tablet Take 1 tablet (12.5 mg total) by mouth every 6 (six) hours as needed. 30 tablet 2   sildenafil (VIAGRA) 100 MG tablet Take 100 mg by mouth daily as needed.     tamsulosin (FLOMAX) 0.4 MG CAPS capsule Take 0.4 mg by mouth 2 (two) times daily.     venlafaxine XR (EFFEXOR-XR) 75 MG 24 hr capsule Take 225 mg by mouth daily.     Vitamin D, Ergocalciferol, (DRISDOL) 1.25 MG (50000 UNIT) CAPS capsule Take 50,000 Units by mouth once a week.     chlorhexidine (PERIDEX) 0.12 % solution Use as directed 5 mLs in the mouth or throat in the morning, at noon, and at bedtime. (Patient not taking: Reported on 08/16/2022)     tiZANidine (ZANAFLEX) 4 MG tablet Take 4 mg by mouth 3 (three) times daily. (Patient not taking: Reported on 08/16/2022)     Current Facility-Administered Medications on File Prior to Visit  Medication Dose Route Frequency Provider Last Rate Last Admin   alteplase (CATHFLO ACTIVASE) injection 2 mg  2 mg Intracatheter Once PRN Ladene Artist, MD       sodium chloride flush (NS) 0.9 % injection 10 mL  10 mL Intravenous PRN Ladene Artist, MD   10 mL at 02/04/16 1431   sodium chloride flush (NS) 0.9 % injection 10 mL  10 mL Intravenous PRN Ladene Artist, MD   10 mL at 10/18/16 0938   sodium chloride flush (NS) 0.9 % injection 10 mL  10 mL Intravenous PRN Ladene Artist, MD   10 mL at 05/03/17 0855    ALLERGIES: Allergies  Allergen Reactions   Hydrocodone Hives    FAMILY HISTORY: Family History  Problem Relation Age of Onset   Thyroid disease Mother        hypothyroidism   Heart attack Maternal Grandfather    Heart Problems Father        pacermaker   Edema Father    Heart disease Maternal Grandmother    Lung cancer Maternal Grandmother    Diabetes Maternal Grandmother    Hypertension Maternal Grandmother     SOCIAL  HISTORY: Social History   Socioeconomic History   Marital status: Married    Spouse name: Not on file   Number of children: 5   Years of education: Not on file   Highest education level: Not on file  Occupational History   Occupation: Disabled    Employer: UNEMPLOYED  Tobacco Use   Smoking status: Never   Smokeless tobacco: Never  Vaping Use   Vaping Use: Never used  Substance and Sexual Activity   Alcohol use: Yes    Comment: RARE   Drug use: Never   Sexual activity: Not on file  Other Topics Concern   Not on file  Social History Narrative   Married - wife works Immunologist unit @ [employer]   5 daughters   Regular exercise-no   Disabled to due back problem   12/04/2015   Right handed    Social Determinants of Health   Financial Resource Strain: Not on file  Food Insecurity: Not on file  Transportation Needs: Not on file  Physical Activity: Not on file  Stress: Not on file  Social Connections: Not on file  Intimate Partner Violence: Not on file     PHYSICAL EXAM: Vitals:   09/07/22 1000  BP: 134/75  Pulse: 93  SpO2: 94%   General: No  acute distress Head:  Normocephalic/atraumatic Skin/Extremities: No rash, no edema Neurological Exam: alert and awake. No aphasia or dysarthria. Fund of knowledge is appropriate.  Attention and concentration are normal.   Cranial nerves: Pupils equal, round. Extraocular movements intact with no nystagmus. Visual fields full.  No facial asymmetry.  Motor: Bulk and tone normal, muscle strength 5/5 throughout with no pronator drift.   Finger to nose testing intact.  Gait narrow-based and steady, mild difficulty with tandem walk. Romberg negative.   IMPRESSION: This is a pleasant 57 yo RH man with a history of hypertension, diabetes, colon cancer on chemotherapy, Graves disease s/p radioactive iodine, PVD, s/p bypass graft, chronic thrombocytopenia, with recurrent seizures. First seizure in June 2021 occurred in setting of significant  hyperglycemia, however his EEG was abnormal with frequent spikes over the right frontocentral region, maximal at F4. MRI brain unremarkable. He had another seizure in November 2021. His 24-hour EEG showed generalized 4-5 Hz spike and wave discharges consistent with a primary generalized epilepsy. He was seizure-free for almost 2 years until a seizure on 12/18/21 in the setting of missed medication. He has been taking Levetiracetam  BID regularly since then and knows the importance of compliance. He is aware of  driving laws to stop driving after a seizure until 6 months seizure-free. Follow-up in 1 year, call for any changes.    Thank you for allowing me to participate in his care.  Please do not hesitate to call for any questions or concerns.    Patrcia Dolly, M.D.   CC: Dr. Nedra Hai

## 2022-09-07 NOTE — Patient Instructions (Signed)
Always good to see you. Continue Levetiracetam (Keppra)  twice a day. Follow-up in 1 year, call for any changes.    Seizure Precautions: 1. If medication has been prescribed for you to prevent seizures, take it exactly as directed.  Do not stop taking the medicine without talking to your doctor first, even if you have not had a seizure in a long time.   2. Avoid activities in which a seizure would cause danger to yourself or to others.  Don't operate dangerous machinery, swim alone, or climb in high or dangerous places, such as on ladders, roofs, or girders.  Do not drive unless your doctor says you may.  3. If you have any warning that you may have a seizure, lay down in a safe place where you can't hurt yourself.    4.  No driving for 6 months from last seizure, as per [facility].   Please refer to the following link on the [employer]'s website for more information: http://www.epilepsyfoundation.org/answerplace/Social/driving/drivingu.cfm   5.  Maintain good sleep hygiene. Avoid alcohol.  6.  Contact your doctor if you have any problems that may be related to the medicine you are taking.  7.  Call 911 and bring the patient back to the ED if:        A.  The seizure lasts longer than 5 minutes.       B.  The patient doesn't awaken shortly after the seizure  C.  The patient has new problems such as difficulty seeing, speaking or moving  D.  The patient was injured during the seizure  E.  The patient has a temperature over 102 F (39C)  F.  The patient vomited and now is having trouble breathing

## 2022-09-09 ENCOUNTER — Other Ambulatory Visit: Payer: Self-pay

## 2022-09-09 ENCOUNTER — Other Ambulatory Visit (HOSPITAL_COMMUNITY): Payer: Self-pay

## 2022-09-12 ENCOUNTER — Inpatient Hospital Stay: Payer: Medicaid Other | Admitting: Nurse Practitioner

## 2022-09-12 ENCOUNTER — Encounter: Payer: Self-pay | Admitting: Nurse Practitioner

## 2022-09-12 ENCOUNTER — Inpatient Hospital Stay: Payer: Medicaid Other

## 2022-09-12 VITALS — BP 142/80 | HR 100 | Temp 98.2°F | Resp 18 | Ht 70.0 in | Wt 215.0 lb

## 2022-09-12 DIAGNOSIS — C187 Malignant neoplasm of sigmoid colon: Secondary | ICD-10-CM

## 2022-09-12 DIAGNOSIS — Z95828 Presence of other vascular implants and grafts: Secondary | ICD-10-CM

## 2022-09-12 DIAGNOSIS — C787 Secondary malignant neoplasm of liver and intrahepatic bile duct: Secondary | ICD-10-CM | POA: Diagnosis not present

## 2022-09-12 DIAGNOSIS — C189 Malignant neoplasm of colon, unspecified: Secondary | ICD-10-CM | POA: Diagnosis not present

## 2022-09-12 LAB — CMP (CANCER CENTER ONLY)
ALT: 33 U/L (ref 0–44)
AST: 38 U/L (ref 15–41)
Albumin: 3.8 g/dL (ref 3.5–5.0)
Alkaline Phosphatase: 70 U/L (ref 38–126)
Anion gap: 11 (ref 5–15)
BUN: 6 mg/dL (ref 6–20)
CO2: 26 mmol/L (ref 22–32)
Calcium: 8.7 mg/dL — ABNORMAL LOW (ref 8.9–10.3)
Chloride: 101 mmol/L (ref 98–111)
Creatinine: 0.87 mg/dL (ref 0.61–1.24)
GFR, Estimated: >60 mL/min (ref >60)
Glucose, Bld: 224 mg/dL — ABNORMAL HIGH (ref 70–99)
Potassium: 3.4 mmol/L — ABNORMAL LOW (ref 3.5–5.1)
Sodium: 138 mmol/L (ref 135–145)
Total Bilirubin: 0.4 mg/dL (ref 0.3–1.2)
Total Protein: 7.1 g/dL (ref 6.5–8.1)

## 2022-09-12 LAB — CBC WITH DIFFERENTIAL (CANCER CENTER ONLY)
Abs Immature Granulocytes: 0.02 10*3/uL (ref 0.00–0.07)
Basophils Absolute: 0 10*3/uL (ref 0.0–0.1)
Basophils Relative: 0 %
Eosinophils Absolute: 0.2 10*3/uL (ref 0.0–0.5)
Eosinophils Relative: 2 %
HCT: 40.3 % (ref 39.0–52.0)
Hemoglobin: 12.6 g/dL — ABNORMAL LOW (ref 13.0–17.0)
Immature Granulocytes: 0 %
Lymphocytes Relative: 35 %
Lymphs Abs: 2.9 10*3/uL (ref 0.7–4.0)
MCH: 28.1 pg (ref 26.0–34.0)
MCHC: 31.3 g/dL (ref 30.0–36.0)
MCV: 90 fL (ref 80.0–100.0)
Monocytes Absolute: 0.6 10*3/uL (ref 0.1–1.0)
Monocytes Relative: 7 %
Neutro Abs: 4.7 10*3/uL (ref 1.7–7.7)
Neutrophils Relative %: 56 %
Platelet Count: 140 10*3/uL — ABNORMAL LOW (ref 150–400)
RBC: 4.48 MIL/uL (ref 4.22–5.81)
RDW: 15.2 % (ref 11.5–15.5)
WBC Count: 8.3 10*3/uL (ref 4.0–10.5)
nRBC: 0 % (ref 0.0–0.2)

## 2022-09-12 LAB — MAGNESIUM: Magnesium: 1.7 mg/dL (ref 1.7–2.4)

## 2022-09-12 LAB — CEA (ACCESS): CEA (CHCC): 209.95 ng/mL — ABNORMAL HIGH (ref 0.00–5.00)

## 2022-09-12 MED ORDER — HEPARIN SOD (PORK) LOCK FLUSH 100 UNIT/ML IV SOLN
500.0000 [IU] | Freq: Once | INTRAVENOUS | Status: AC | PRN
  Administered 2022-09-12: 500 [IU] via INTRAVENOUS

## 2022-09-12 MED ORDER — SODIUM CHLORIDE 0.9% FLUSH
10.0000 mL | INTRAVENOUS | Status: DC | PRN
  Administered 2022-09-12: 10 mL via INTRAVENOUS

## 2022-09-12 MED ORDER — LIDOCAINE-PRILOCAINE 2.5-2.5 % EX CREA
1.0000 | TOPICAL_CREAM | CUTANEOUS | 3 refills | Status: DC | PRN
Start: 2022-09-12 — End: 2022-10-11

## 2022-09-12 NOTE — Progress Notes (Signed)
[facility] OFFICE PROGRESS NOTE   Diagnosis: Colon cancer  INTERVAL HISTORY:   Jeff [name] returns as scheduled.  He completed cycle 4 fruquintinib beginning 08/16/2022.  He denies nausea/vomiting.  No mouth sores.  No diarrhea.  No skin rash.  No bleeding.  Hands are intermittently dry.  Objective:  Vital signs in last 24 hours:  Blood pressure (!) 142/80, pulse 100, temperature 98.2 F (36.8 C), temperature source Oral, resp. rate 18, height 5\' 10"  (1.778 m), weight 215 lb (97.5 kg), SpO2 100 %.    HEENT: Edentulous.  No thrush or ulcers. Resp: Lungs clear bilaterally. Cardio: Regular rate and rhythm. GI: No hepatosplenomegaly. Vascular: No leg edema. Skin: Palms without erythema. Port-A-Cath without erythema.  Lab Results:  Lab Results  Component Value Date   WBC 6.5 08/16/2022   HGB 11.4 (L) 08/16/2022   HCT 35.9 (L) 08/16/2022   MCV 91.6 08/16/2022   PLT 121 (L) 08/16/2022   NEUTROABS 2.9 08/16/2022    Imaging:  No results found.  Medications: I have reviewed the patient's current medications.  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, biopsy confirmed adenocarcinoma         CTs chest, abdomen, and pelvis on 12/11/2015-indeterminate tiny pulmonary nodules, multiple liver metastases, small nodes in the sigmoid mesocolon Laparoscopic sigmoid colectomy, liver biopsy, Port-A-Cath placement 01/14/2016 Pathology sigmoid colon resection- colonic adenocarcinoma, 5.3 cm extending into pericolonic connective tissue, positive lymph-vascular invasion, positive perineural invasion, negative margins, metastatic carcinoma in 9 of 28 lymph nodes Pathology liver biopsy-metastatic colorectal adenocarcinoma MSI stable; mismatch repair protein normal APC alteration, K RAS wild-type, no BRAF mutation Cycle 1 FOLFOX 02/02/2016 Cycle 2 FOLFOX 02/15/2016 Cycle 3 FOLFOX 02/29/2016 Cycle 4 FOLFOX  03/14/2016 Cycle 5 FOLFOX 03/28/2016 Cycle 6 FOLFOX 04/11/2016 (oxaliplatin held secondary to thrombocytopenia) 04/21/2016 restaging CTs-difficulty evaluating liver lesions due to hepatic steatosis. Stable right adrenal nodule. No adenopathy or local recurrence near the rectosigmoid anastomotic site. Cycle 7 FOLFOX 04/25/2016 MRI liver 05/02/2016-partial improvement in hepatic metastases Cycle 8 FOLFOX 05/10/2016 Cycle 9 FOLFOX 05/23/2016 (oxaliplatin held due to thrombocytopenia) Cycle 10 FOLFOX 06/06/2016 (oxaliplatin dose reduced due to thrombocytopenia) Cycle 11 FOLFOX 06/27/2016 (oxaliplatin held due to neuropathy) Cycle 12 FOLFOX 07/11/2016 (oxaliplatin held) Initiation of maintenance Xeloda 7 days on/7 days off 07/27/2016 MRI liver 11/18/2016-decrease in hepatic metastatic disease. No new or progressive disease identified within the abdomen. Continuation of Xeloda 7 days on/7 days off MRI liver 04/27/2017-previous liver lesions not identified, no new lesions, no lymphadenopathy Xeloda continued 7 days on/7 days off MRI liver 12/04/2017 - no evidence of metastatic disease, hepatic steatosis Xeloda continued 7 days on/7 days off MRI liver 07/15/2018- no evidence of metastatic disease.  Stable severe hepatic steatosis. Xeloda continued 7 days on/7 days off MRI liver 03/16/2019-hepatic steatosis, no liver mass, focal area of intrahepatic biliary dilatation in segments 2 and 3 of the left lobe-increased Xeloda continued 7 days on/7 days off MRI abdomen 08/19/2019-no findings to suggest liver metastases.  Bilateral lung nodules measuring up to 2.8 cm, progressive and more conspicuous than on previous exam CT chest 08/29/2019-multiple pulmonary metastases, new from 04/21/2016 Cycle 1 FOLFIRI/bevacizumab 09/09/2019 Cycle 2 FOLFIRI/bevacizumab 09/26/2019  Cycle 3 FOLFIRI/bevacizumab 10/10/2019 Cycle 4 FOLFIRI/bevacizumab 10/24/2019 Cycle 5 FOLFIRI/bevacizumab 11/07/2019 CT chest 11/14/2019-decreased  size of lung nodules, no new lesions, hepatic steatosis Cycle 6 FOLFIRI/bevacizumab 11/21/2019 Cycle 7 FOLFIRI/bevacizumab 12/05/2019 Cycle 8 FOLFIRI/bevacizumab 12/19/2019 Cycle 9 FOLFIRI/bevacizumab 01/02/2020 Cycle 10 FOLFIRI/bevacizumab 01/16/2020 CT chest 01/29/2020-stable bilateral  pulmonary metastases.  No new or progressive metastatic disease in the chest. Cycle 11 FOLFIRI/bevacizumab 01/30/2020 Cycle 12 FOLFIRI/bevacizumab 02/19/2020 Cycle 13 FOLFIRI/bevacizumab 03/12/2020 Cycle 14 FOLFIRI/bevacizumab 04/02/2020 Cycle 15 FOLFIRI/bevacizumab 04/23/2020 Cycle 16 FOLFIRI/bevacizumab 05/21/2020 CT chest 06/09/2020-mild progression pulmonary metastasis.  Some lesions have increased in size while others are similar. Cycle 1 irinotecan/Panitumumab 06/18/2020 Cycle 2 irinotecan/Panitumumab 07/02/2020 Cycle 3 irinotecan/panitumumab 07/16/2020 Cycle 4 irinotecan/Panitumumab 07/30/2020 Cycle 5 irinotecan/Panitumumab 08/13/2020, Emend added Cycle 6 irinotecan/Panitumumab 08/27/2020 CT chest 09/08/2020-decreased size of pulmonary nodules, no progressive disease Cycle 7 irinotecan/panitumumab 09/02/2020 Cycle 8 irinotecan/panitumumab 09/24/2020 Cycle 9 irinotecan/Panitumumab 10/08/2020 10/22/2020 treatment held due to left foot fracture, need for surgery Cycle 10 irinotecan/panitumumab 11/24/2020 Cycle 11 irinotecan/Panitumumab 12/08/2020 Cycle 12 irinotecan/panitumumab 12/22/2020 Cycle 13 irinotecan/panitumumab 01/05/2021 01/20/2021 CT chest-mixed response with minimal increase in size of some lesions and minimal decrease in the size of other lesions.  Overall number of lesions is unchanged. Cycle 14 irinotecan/Panitumumab 02/05/2021 Cycle 15 irinotecan/Panitumumab 02/23/2021 Cycle 16 irinotecan/panitumumab 03/16/2021 Cycle 17 irinotecan/Panitumumab 04/13/2021 05/03/2021-CT chest-enlargement of pulmonary metastases, no new lesions 06/21/2021-cycle 1 Lonsurf/bevacizumab 07/19/2021-cycle 2 Lonsurf/bevacizumab 08/16/2021-cycle 3  Lonsurf/bevacizumab 09/09/2021 CT chest-bilateral lung nodules and masses with mixed response, overall stable to very minimally increased 09/13/2021 cycle 4 Lonsurf/bevacizumab 10/12/2021 cycle 5 Lonsurf/bevacizumab 10/21/2021 chest CT-bilateral pulmonary metastases without significant change.  No new or progressive disease within the chest. 11/08/2021 cycle 6 Lonsurf/bevacizumab 12/06/2021 cycle 7 Lonsurf/bevacizumab 01/03/2022 cycle 8 Lonsurf/bevacizumab CTs 01/25/2022-index lung lesion stable to mildly increased in size.  Mild retroperitoneal adenopathy, mildly increased in size compared to 12/16/2020. 02/01/2022 cycle 9 Lonsurf/bevacizumab 02/28/2022 cycle 10 Lonsurf/bevacizumab 03/28/2022 cycle 11 Lonsurf/bevacizumab CTs 04/21/2022-no change in bilateral pulmonary metastases, 2 new hypoattenuating liver lesions, new enlarged portacaval node Cycle 1 fruquintinib 05/24/2022 Cycle 2 fruquintinib 06/21/2022 Cycle 3 fruquintinib 07/19/2022 CTs 08/12/2022-stable lung nodules, hilar and retroperitoneal nodes, slight increase in size of liver metastases, stable right adrenal nodule Cycle 4 fruquintinib 08/16/2022 Cycle 5 fruquintinib 09/13/2022   2.   Rectal bleeding and constipation secondary to #1   3.   History of peripheral vascular disease, status post left lower extremity vascular bypass surgery in April 2017   4.   History of nephrolithiasis   5.   History of Graves' disease treated with radioactive iodine   6.   Anxiety/depression   7.   Hypertension   8.   Hospitalization 01/19/2016 with wound dehiscence status post secondary suture closure of abdominal wall   9.   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 Hyperglycemia 06/20/2016-diagnosed with diabetes, maintained on insulin   11.  Positive COVID test 12/13/2018; positive COVID test 01/25/2021   12. Hospitalized with seizure activity/DKA. Now on Keppra, insulin adjusted. Brain MRI  10/25/2019 with no seizure etiology identified, no acute abnormality; EEG 10/25/2019 with evidence of epileptogenicity arising from right frontocentral region. 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  Left foot fracture-surgical repair 11/11/2020    Disposition: Jeff [name] appears stable.  He has completed 4 cycles of fruquintinib.  There is no clinical evidence of disease progression.  Plan to proceed with cycle 5 today as scheduled.  We will follow-up on the CEA from today.  CBC and chemistry panel reviewed.  Labs adequate to begin fruquintinib as above.  He will return for lab and follow-up in 4 weeks.  He will contact the office in the interim with any problems.    Lonna Cobb ANP/GNP-BC   09/12/2022  9:45 AM

## 2022-09-29 ENCOUNTER — Other Ambulatory Visit (HOSPITAL_COMMUNITY): Payer: Self-pay

## 2022-10-03 ENCOUNTER — Other Ambulatory Visit (HOSPITAL_COMMUNITY): Payer: Self-pay

## 2022-10-03 ENCOUNTER — Other Ambulatory Visit: Payer: Self-pay | Admitting: Oncology

## 2022-10-03 DIAGNOSIS — C189 Malignant neoplasm of colon, unspecified: Secondary | ICD-10-CM

## 2022-10-05 ENCOUNTER — Other Ambulatory Visit (HOSPITAL_COMMUNITY): Payer: Self-pay

## 2022-10-05 ENCOUNTER — Other Ambulatory Visit: Payer: Self-pay

## 2022-10-05 ENCOUNTER — Other Ambulatory Visit: Payer: Self-pay | Admitting: Oncology

## 2022-10-05 DIAGNOSIS — C187 Malignant neoplasm of sigmoid colon: Secondary | ICD-10-CM

## 2022-10-05 MED ORDER — FRUQUINTINIB 5 MG PO CAPS
5.0000 mg | ORAL_CAPSULE | Freq: Every day | ORAL | 0 refills | Status: DC
Start: 2022-10-05 — End: 2022-11-15
  Filled 2022-10-05: qty 21, 21d supply, fill #0

## 2022-10-07 ENCOUNTER — Other Ambulatory Visit: Payer: Self-pay

## 2022-10-07 ENCOUNTER — Other Ambulatory Visit (HOSPITAL_COMMUNITY): Payer: Self-pay

## 2022-10-11 ENCOUNTER — Other Ambulatory Visit (HOSPITAL_COMMUNITY): Payer: Self-pay

## 2022-10-11 ENCOUNTER — Inpatient Hospital Stay: Payer: Medicaid Other

## 2022-10-11 ENCOUNTER — Inpatient Hospital Stay: Payer: Medicaid Other | Attending: Oncology

## 2022-10-11 ENCOUNTER — Encounter: Payer: Self-pay | Admitting: Nurse Practitioner

## 2022-10-11 ENCOUNTER — Inpatient Hospital Stay: Payer: Medicaid Other | Admitting: Nurse Practitioner

## 2022-10-11 VITALS — BP 142/80 | HR 97 | Temp 98.1°F | Resp 18 | Ht 70.0 in | Wt 216.6 lb

## 2022-10-11 DIAGNOSIS — C787 Secondary malignant neoplasm of liver and intrahepatic bile duct: Secondary | ICD-10-CM | POA: Diagnosis present

## 2022-10-11 DIAGNOSIS — Z8616 Personal history of COVID-19: Secondary | ICD-10-CM | POA: Diagnosis not present

## 2022-10-11 DIAGNOSIS — C187 Malignant neoplasm of sigmoid colon: Secondary | ICD-10-CM

## 2022-10-11 DIAGNOSIS — C7802 Secondary malignant neoplasm of left lung: Secondary | ICD-10-CM | POA: Insufficient documentation

## 2022-10-11 DIAGNOSIS — C189 Malignant neoplasm of colon, unspecified: Secondary | ICD-10-CM

## 2022-10-11 DIAGNOSIS — C7801 Secondary malignant neoplasm of right lung: Secondary | ICD-10-CM | POA: Diagnosis not present

## 2022-10-11 LAB — CBC WITH DIFFERENTIAL (CANCER CENTER ONLY)
Abs Immature Granulocytes: 0.01 10*3/uL (ref 0.00–0.07)
Basophils Absolute: 0 10*3/uL (ref 0.0–0.1)
Basophils Relative: 0 %
Eosinophils Absolute: 0.2 10*3/uL (ref 0.0–0.5)
Eosinophils Relative: 2 %
HCT: 37.6 % — ABNORMAL LOW (ref 39.0–52.0)
Hemoglobin: 11.8 g/dL — ABNORMAL LOW (ref 13.0–17.0)
Immature Granulocytes: 0 %
Lymphocytes Relative: 40 %
Lymphs Abs: 3 10*3/uL (ref 0.7–4.0)
MCH: 28.2 pg (ref 26.0–34.0)
MCHC: 31.4 g/dL (ref 30.0–36.0)
MCV: 89.7 fL (ref 80.0–100.0)
Monocytes Absolute: 0.6 10*3/uL (ref 0.1–1.0)
Monocytes Relative: 7 %
Neutro Abs: 3.9 10*3/uL (ref 1.7–7.7)
Neutrophils Relative %: 51 %
Platelet Count: 124 10*3/uL — ABNORMAL LOW (ref 150–400)
RBC: 4.19 MIL/uL — ABNORMAL LOW (ref 4.22–5.81)
RDW: 15.6 % — ABNORMAL HIGH (ref 11.5–15.5)
WBC Count: 7.6 10*3/uL (ref 4.0–10.5)
nRBC: 0 % (ref 0.0–0.2)

## 2022-10-11 LAB — CMP (CANCER CENTER ONLY)
ALT: 34 U/L (ref 0–44)
AST: 40 U/L (ref 15–41)
Albumin: 3.8 g/dL (ref 3.5–5.0)
Alkaline Phosphatase: 73 U/L (ref 38–126)
Anion gap: 10 (ref 5–15)
BUN: 9 mg/dL (ref 6–20)
CO2: 25 mmol/L (ref 22–32)
Calcium: 8.9 mg/dL (ref 8.9–10.3)
Chloride: 101 mmol/L (ref 98–111)
Creatinine: 1.01 mg/dL (ref 0.61–1.24)
GFR, Estimated: >60 mL/min (ref >60)
Glucose, Bld: 178 mg/dL — ABNORMAL HIGH (ref 70–99)
Potassium: 3.7 mmol/L (ref 3.5–5.1)
Sodium: 136 mmol/L (ref 135–145)
Total Bilirubin: 0.4 mg/dL (ref 0.3–1.2)
Total Protein: 7.1 g/dL (ref 6.5–8.1)

## 2022-10-11 LAB — TOTAL PROTEIN, URINE DIPSTICK: Protein, ur: NEGATIVE mg/dL

## 2022-10-11 LAB — CEA (ACCESS): CEA (CHCC): 203.54 ng/mL — ABNORMAL HIGH (ref 0.00–5.00)

## 2022-10-11 LAB — MAGNESIUM: Magnesium: 1.6 mg/dL — ABNORMAL LOW (ref 1.7–2.4)

## 2022-10-11 MED ORDER — LIDOCAINE-PRILOCAINE 2.5-2.5 % EX CREA
1.0000 | TOPICAL_CREAM | CUTANEOUS | 3 refills | Status: DC | PRN
Start: 2022-10-11 — End: 2023-03-17
  Filled 2022-10-11: qty 30, 1d supply, fill #0

## 2022-10-11 NOTE — Progress Notes (Signed)
[facility] OFFICE PROGRESS NOTE   Diagnosis: Colon cancer  INTERVAL HISTORY:   Jeff [name] returns as scheduled.  He completed cycle 5 fruquintinib beginning 09/13/2022.  He denies nausea/vomiting.  No mouth sores.  No significant diarrhea.  Notes his hands become dry while taking fruquintinib.  No rash.  No bleeding.  No abdominal pain.  Objective:  Vital signs in last 24 hours:  Blood pressure (!) 142/80, pulse 97, temperature 98.1 F (36.7 C), temperature source Oral, resp. rate 18, height 5\' 10"  (1.778 m), weight 216 lb 9.6 oz (98.2 kg), SpO2 100 %.    HEENT: No thrush or ulcers. Resp: Lungs clear bilaterally. Cardio: Regular rate and rhythm. GI: No hepatosplenomegaly. Vascular: No leg edema. Skin: Palms appear mildly dry. Port-A-Cath without erythema.  Lab Results:  Lab Results  Component Value Date   WBC 7.6 10/11/2022   HGB 11.8 (L) 10/11/2022   HCT 37.6 (L) 10/11/2022   MCV 89.7 10/11/2022   PLT 124 (L) 10/11/2022   NEUTROABS 3.9 10/11/2022    Imaging:  No results found.  Medications: I have reviewed the patient's current medications.  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, biopsy confirmed adenocarcinoma         CTs chest, abdomen, and pelvis on 12/11/2015-indeterminate tiny pulmonary nodules, multiple liver metastases, small nodes in the sigmoid mesocolon Laparoscopic sigmoid colectomy, liver biopsy, Port-A-Cath placement 01/14/2016 Pathology sigmoid colon resection- colonic adenocarcinoma, 5.3 cm extending into pericolonic connective tissue, positive lymph-vascular invasion, positive perineural invasion, negative margins, metastatic carcinoma in 9 of 28 lymph nodes Pathology liver biopsy-metastatic colorectal adenocarcinoma MSI stable; mismatch repair protein normal APC alteration, K RAS wild-type, no BRAF mutation Cycle 1 FOLFOX 02/02/2016 Cycle 2 FOLFOX  02/15/2016 Cycle 3 FOLFOX 02/29/2016 Cycle 4 FOLFOX 03/14/2016 Cycle 5 FOLFOX 03/28/2016 Cycle 6 FOLFOX 04/11/2016 (oxaliplatin held secondary to thrombocytopenia) 04/21/2016 restaging CTs-difficulty evaluating liver lesions due to hepatic steatosis. Stable right adrenal nodule. No adenopathy or local recurrence near the rectosigmoid anastomotic site. Cycle 7 FOLFOX 04/25/2016 MRI liver 05/02/2016-partial improvement in hepatic metastases Cycle 8 FOLFOX 05/10/2016 Cycle 9 FOLFOX 05/23/2016 (oxaliplatin held due to thrombocytopenia) Cycle 10 FOLFOX 06/06/2016 (oxaliplatin dose reduced due to thrombocytopenia) Cycle 11 FOLFOX 06/27/2016 (oxaliplatin held due to neuropathy) Cycle 12 FOLFOX 07/11/2016 (oxaliplatin held) Initiation of maintenance Xeloda 7 days on/7 days off 07/27/2016 MRI liver 11/18/2016-decrease in hepatic metastatic disease. No new or progressive disease identified within the abdomen. Continuation of Xeloda 7 days on/7 days off MRI liver 04/27/2017-previous liver lesions not identified, no new lesions, no lymphadenopathy Xeloda continued 7 days on/7 days off MRI liver 12/04/2017 - no evidence of metastatic disease, hepatic steatosis Xeloda continued 7 days on/7 days off MRI liver 07/15/2018- no evidence of metastatic disease.  Stable severe hepatic steatosis. Xeloda continued 7 days on/7 days off MRI liver 03/16/2019-hepatic steatosis, no liver mass, focal area of intrahepatic biliary dilatation in segments 2 and 3 of the left lobe-increased Xeloda continued 7 days on/7 days off MRI abdomen 08/19/2019-no findings to suggest liver metastases.  Bilateral lung nodules measuring up to 2.8 cm, progressive and more conspicuous than on previous exam CT chest 08/29/2019-multiple pulmonary metastases, new from 04/21/2016 Cycle 1 FOLFIRI/bevacizumab 09/09/2019 Cycle 2 FOLFIRI/bevacizumab 09/26/2019  Cycle 3 FOLFIRI/bevacizumab 10/10/2019 Cycle 4 FOLFIRI/bevacizumab 10/24/2019 Cycle 5  FOLFIRI/bevacizumab 11/07/2019 CT chest 11/14/2019-decreased size of lung nodules, no new lesions, hepatic steatosis Cycle 6 FOLFIRI/bevacizumab 11/21/2019 Cycle 7 FOLFIRI/bevacizumab 12/05/2019 Cycle 8 FOLFIRI/bevacizumab 12/19/2019 Cycle 9 FOLFIRI/bevacizumab  01/02/2020 Cycle 10 FOLFIRI/bevacizumab 01/16/2020 CT chest 01/29/2020-stable bilateral pulmonary metastases.  No new or progressive metastatic disease in the chest. Cycle 11 FOLFIRI/bevacizumab 01/30/2020 Cycle 12 FOLFIRI/bevacizumab 02/19/2020 Cycle 13 FOLFIRI/bevacizumab 03/12/2020 Cycle 14 FOLFIRI/bevacizumab 04/02/2020 Cycle 15 FOLFIRI/bevacizumab 04/23/2020 Cycle 16 FOLFIRI/bevacizumab 05/21/2020 CT chest 06/09/2020-mild progression pulmonary metastasis.  Some lesions have increased in size while others are similar. Cycle 1 irinotecan/Panitumumab 06/18/2020 Cycle 2 irinotecan/Panitumumab 07/02/2020 Cycle 3 irinotecan/panitumumab 07/16/2020 Cycle 4 irinotecan/Panitumumab 07/30/2020 Cycle 5 irinotecan/Panitumumab 08/13/2020, Emend added Cycle 6 irinotecan/Panitumumab 08/27/2020 CT chest 09/08/2020-decreased size of pulmonary nodules, no progressive disease Cycle 7 irinotecan/panitumumab 09/02/2020 Cycle 8 irinotecan/panitumumab 09/24/2020 Cycle 9 irinotecan/Panitumumab 10/08/2020 10/22/2020 treatment held due to left foot fracture, need for surgery Cycle 10 irinotecan/panitumumab 11/24/2020 Cycle 11 irinotecan/Panitumumab 12/08/2020 Cycle 12 irinotecan/panitumumab 12/22/2020 Cycle 13 irinotecan/panitumumab 01/05/2021 01/20/2021 CT chest-mixed response with minimal increase in size of some lesions and minimal decrease in the size of other lesions.  Overall number of lesions is unchanged. Cycle 14 irinotecan/Panitumumab 02/05/2021 Cycle 15 irinotecan/Panitumumab 02/23/2021 Cycle 16 irinotecan/panitumumab 03/16/2021 Cycle 17 irinotecan/Panitumumab 04/13/2021 05/03/2021-CT chest-enlargement of pulmonary metastases, no new lesions 06/21/2021-cycle 1  Lonsurf/bevacizumab 07/19/2021-cycle 2 Lonsurf/bevacizumab 08/16/2021-cycle 3 Lonsurf/bevacizumab 09/09/2021 CT chest-bilateral lung nodules and masses with mixed response, overall stable to very minimally increased 09/13/2021 cycle 4 Lonsurf/bevacizumab 10/12/2021 cycle 5 Lonsurf/bevacizumab 10/21/2021 chest CT-bilateral pulmonary metastases without significant change.  No new or progressive disease within the chest. 11/08/2021 cycle 6 Lonsurf/bevacizumab 12/06/2021 cycle 7 Lonsurf/bevacizumab 01/03/2022 cycle 8 Lonsurf/bevacizumab CTs 01/25/2022-index lung lesion stable to mildly increased in size.  Mild retroperitoneal adenopathy, mildly increased in size compared to 12/16/2020. 02/01/2022 cycle 9 Lonsurf/bevacizumab 02/28/2022 cycle 10 Lonsurf/bevacizumab 03/28/2022 cycle 11 Lonsurf/bevacizumab CTs 04/21/2022-no change in bilateral pulmonary metastases, 2 new hypoattenuating liver lesions, new enlarged portacaval node Cycle 1 fruquintinib 05/24/2022 Cycle 2 fruquintinib 06/21/2022 Cycle 3 fruquintinib 07/19/2022 CTs 08/12/2022-stable lung nodules, hilar and retroperitoneal nodes, slight increase in size of liver metastases, stable right adrenal nodule Cycle 4 fruquintinib 08/16/2022 Cycle 5 fruquintinib 09/13/2022 Cycle 6 fruquintinib 10/11/2022   2.   Rectal bleeding and constipation secondary to #1   3.   History of peripheral vascular disease, status post left lower extremity vascular bypass surgery in April 2017   4.   History of nephrolithiasis   5.   History of Graves' disease treated with radioactive iodine   6.   Anxiety/depression   7.   Hypertension   8.   Hospitalization 01/19/2016 with wound dehiscence status post secondary suture closure of abdominal wall   9.   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 Hyperglycemia 06/20/2016-diagnosed with diabetes, maintained on insulin   11.  Positive COVID test 12/13/2018; positive COVID test  01/25/2021   12. Hospitalized with seizure activity/DKA. Now on Keppra, insulin adjusted. 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  Left foot fracture-surgical repair 11/11/2020    Disposition: Jeff [name] appears stable.  He has completed 5 cycles of fruquintinib.  He continues to tolerate well.  He will begin cycle 6 today as scheduled.  Restaging CTs prior to next office visit.  CBC and chemistry panel reviewed.  Labs adequate to proceed as above.  He will return for follow-up in 4 weeks, restaging CTs a few days prior.    Lonna Cobb ANP/GNP-BC   10/11/2022  9:30 AM

## 2022-10-21 ENCOUNTER — Inpatient Hospital Stay (HOSPITAL_COMMUNITY)
Admission: EM | Admit: 2022-10-21 | Discharge: 2022-10-25 | DRG: 062 | Disposition: A | Discharge status: Home / Self Care | Payer: Medicaid Other | Attending: Neurology | Admitting: Neurology

## 2022-10-21 ENCOUNTER — Emergency Department (HOSPITAL_COMMUNITY): Payer: Medicaid Other

## 2022-10-21 ENCOUNTER — Inpatient Hospital Stay (HOSPITAL_COMMUNITY): Payer: Medicaid Other

## 2022-10-21 ENCOUNTER — Encounter (HOSPITAL_COMMUNITY): Payer: Self-pay

## 2022-10-21 DIAGNOSIS — Z923 Personal history of irradiation: Secondary | ICD-10-CM

## 2022-10-21 DIAGNOSIS — I10 Essential (primary) hypertension: Secondary | ICD-10-CM | POA: Diagnosis present

## 2022-10-21 DIAGNOSIS — D6959 Other secondary thrombocytopenia: Secondary | ICD-10-CM | POA: Diagnosis present

## 2022-10-21 DIAGNOSIS — Z8616 Personal history of COVID-19: Secondary | ICD-10-CM

## 2022-10-21 DIAGNOSIS — E1165 Type 2 diabetes mellitus with hyperglycemia: Secondary | ICD-10-CM | POA: Diagnosis present

## 2022-10-21 DIAGNOSIS — G40909 Epilepsy, unspecified, not intractable, without status epilepticus: Secondary | ICD-10-CM | POA: Diagnosis present

## 2022-10-21 DIAGNOSIS — C7800 Secondary malignant neoplasm of unspecified lung: Secondary | ICD-10-CM | POA: Diagnosis present

## 2022-10-21 DIAGNOSIS — E0500 Thyrotoxicosis with diffuse goiter without thyrotoxic crisis or storm: Secondary | ICD-10-CM | POA: Diagnosis present

## 2022-10-21 DIAGNOSIS — C189 Malignant neoplasm of colon, unspecified: Secondary | ICD-10-CM | POA: Diagnosis present

## 2022-10-21 DIAGNOSIS — T451X5A Adverse effect of antineoplastic and immunosuppressive drugs, initial encounter: Secondary | ICD-10-CM | POA: Diagnosis present

## 2022-10-21 DIAGNOSIS — I63511 Cerebral infarction due to unspecified occlusion or stenosis of right middle cerebral artery: Secondary | ICD-10-CM | POA: Diagnosis present

## 2022-10-21 DIAGNOSIS — Z95828 Presence of other vascular implants and grafts: Secondary | ICD-10-CM | POA: Diagnosis not present

## 2022-10-21 DIAGNOSIS — I639 Cerebral infarction, unspecified: Principal | ICD-10-CM

## 2022-10-21 DIAGNOSIS — Z79890 Hormone replacement therapy (postmenopausal): Secondary | ICD-10-CM

## 2022-10-21 DIAGNOSIS — I252 Old myocardial infarction: Secondary | ICD-10-CM

## 2022-10-21 DIAGNOSIS — G8194 Hemiplegia, unspecified affecting left nondominant side: Secondary | ICD-10-CM | POA: Diagnosis present

## 2022-10-21 DIAGNOSIS — F419 Anxiety disorder, unspecified: Secondary | ICD-10-CM | POA: Diagnosis present

## 2022-10-21 DIAGNOSIS — E039 Hypothyroidism, unspecified: Secondary | ICD-10-CM | POA: Diagnosis present

## 2022-10-21 DIAGNOSIS — E785 Hyperlipidemia, unspecified: Secondary | ICD-10-CM | POA: Diagnosis present

## 2022-10-21 DIAGNOSIS — I161 Hypertensive emergency: Secondary | ICD-10-CM | POA: Diagnosis present

## 2022-10-21 DIAGNOSIS — Z794 Long term (current) use of insulin: Secondary | ICD-10-CM | POA: Diagnosis not present

## 2022-10-21 DIAGNOSIS — Z8349 Family history of other endocrine, nutritional and metabolic diseases: Secondary | ICD-10-CM

## 2022-10-21 DIAGNOSIS — Z79899 Other long term (current) drug therapy: Secondary | ICD-10-CM

## 2022-10-21 DIAGNOSIS — R29703 NIHSS score 3: Secondary | ICD-10-CM | POA: Diagnosis present

## 2022-10-21 DIAGNOSIS — G4733 Obstructive sleep apnea (adult) (pediatric): Secondary | ICD-10-CM | POA: Diagnosis present

## 2022-10-21 DIAGNOSIS — Z6830 Body mass index (BMI) 30.0-30.9, adult: Secondary | ICD-10-CM

## 2022-10-21 DIAGNOSIS — Z8249 Family history of ischemic heart disease and other diseases of the circulatory system: Secondary | ICD-10-CM

## 2022-10-21 DIAGNOSIS — Z885 Allergy status to narcotic agent status: Secondary | ICD-10-CM

## 2022-10-21 DIAGNOSIS — Z7982 Long term (current) use of aspirin: Secondary | ICD-10-CM

## 2022-10-21 DIAGNOSIS — R29810 Facial weakness: Secondary | ICD-10-CM | POA: Diagnosis present

## 2022-10-21 DIAGNOSIS — I6389 Other cerebral infarction: Secondary | ICD-10-CM | POA: Diagnosis not present

## 2022-10-21 DIAGNOSIS — R569 Unspecified convulsions: Secondary | ICD-10-CM

## 2022-10-21 DIAGNOSIS — Z9049 Acquired absence of other specified parts of digestive tract: Secondary | ICD-10-CM

## 2022-10-21 DIAGNOSIS — Z7984 Long term (current) use of oral hypoglycemic drugs: Secondary | ICD-10-CM

## 2022-10-21 DIAGNOSIS — E669 Obesity, unspecified: Secondary | ICD-10-CM | POA: Diagnosis present

## 2022-10-21 DIAGNOSIS — F32A Depression, unspecified: Secondary | ICD-10-CM | POA: Diagnosis present

## 2022-10-21 DIAGNOSIS — I739 Peripheral vascular disease, unspecified: Secondary | ICD-10-CM | POA: Diagnosis present

## 2022-10-21 DIAGNOSIS — E119 Type 2 diabetes mellitus without complications: Secondary | ICD-10-CM

## 2022-10-21 DIAGNOSIS — Z833 Family history of diabetes mellitus: Secondary | ICD-10-CM

## 2022-10-21 DIAGNOSIS — K219 Gastro-esophageal reflux disease without esophagitis: Secondary | ICD-10-CM | POA: Diagnosis present

## 2022-10-21 DIAGNOSIS — E1151 Type 2 diabetes mellitus with diabetic peripheral angiopathy without gangrene: Secondary | ICD-10-CM | POA: Diagnosis present

## 2022-10-21 DIAGNOSIS — Z801 Family history of malignant neoplasm of trachea, bronchus and lung: Secondary | ICD-10-CM

## 2022-10-21 DIAGNOSIS — R471 Dysarthria and anarthria: Secondary | ICD-10-CM | POA: Diagnosis present

## 2022-10-21 DIAGNOSIS — C787 Secondary malignant neoplasm of liver and intrahepatic bile duct: Secondary | ICD-10-CM | POA: Diagnosis present

## 2022-10-21 LAB — COMPREHENSIVE METABOLIC PANEL
ALT: 46 U/L — ABNORMAL HIGH (ref 0–44)
AST: 51 U/L — ABNORMAL HIGH (ref 15–41)
Albumin: 3.3 g/dL — ABNORMAL LOW (ref 3.5–5.0)
Alkaline Phosphatase: 75 U/L (ref 38–126)
Anion gap: 9 (ref 5–15)
BUN: 5 mg/dL — ABNORMAL LOW (ref 6–20)
CO2: 25 mmol/L (ref 22–32)
Calcium: 8.5 mg/dL — ABNORMAL LOW (ref 8.9–10.3)
Chloride: 102 mmol/L (ref 98–111)
Creatinine, Ser: 0.99 mg/dL (ref 0.61–1.24)
GFR, Estimated: >60 mL/min (ref >60)
Glucose, Bld: 164 mg/dL — ABNORMAL HIGH (ref 70–99)
Potassium: 4.1 mmol/L (ref 3.5–5.1)
Sodium: 136 mmol/L (ref 135–145)
Total Bilirubin: 0.9 mg/dL (ref 0.3–1.2)
Total Protein: 6.6 g/dL (ref 6.5–8.1)

## 2022-10-21 LAB — CBC
HCT: 42.3 % (ref 39.0–52.0)
Hemoglobin: 13.3 g/dL (ref 13.0–17.0)
MCH: 28.4 pg (ref 26.0–34.0)
MCHC: 31.4 g/dL (ref 30.0–36.0)
MCV: 90.4 fL (ref 80.0–100.0)
Platelets: 138 10*3/uL — ABNORMAL LOW (ref 150–400)
RBC: 4.68 MIL/uL (ref 4.22–5.81)
RDW: 15.1 % (ref 11.5–15.5)
WBC: 8 10*3/uL (ref 4.0–10.5)
nRBC: 0 % (ref 0.0–0.2)

## 2022-10-21 LAB — URINALYSIS, ROUTINE W REFLEX MICROSCOPIC
Bacteria, UA: NONE SEEN
Bilirubin Urine: NEGATIVE
Glucose, UA: >=500 mg/dL — AB
Ketones, ur: NEGATIVE mg/dL
Leukocytes,Ua: NEGATIVE
Nitrite: NEGATIVE
Protein, ur: NEGATIVE mg/dL
Specific Gravity, Urine: 1.029 (ref 1.005–1.030)
pH: 7 (ref 5.0–8.0)

## 2022-10-21 LAB — DIFFERENTIAL
Abs Immature Granulocytes: 0.01 10*3/uL (ref 0.00–0.07)
Basophils Absolute: 0 10*3/uL (ref 0.0–0.1)
Basophils Relative: 0 %
Eosinophils Absolute: 0.1 10*3/uL (ref 0.0–0.5)
Eosinophils Relative: 1 %
Immature Granulocytes: 0 %
Lymphocytes Relative: 33 %
Lymphs Abs: 2.7 10*3/uL (ref 0.7–4.0)
Monocytes Absolute: 0.6 10*3/uL (ref 0.1–1.0)
Monocytes Relative: 8 %
Neutro Abs: 4.6 10*3/uL (ref 1.7–7.7)
Neutrophils Relative %: 58 %

## 2022-10-21 LAB — GLUCOSE, CAPILLARY
Glucose-Capillary: 190 mg/dL — ABNORMAL HIGH (ref 70–99)
Glucose-Capillary: 179 mg/dL — ABNORMAL HIGH (ref 70–99)

## 2022-10-21 LAB — RAPID URINE DRUG SCREEN, HOSP PERFORMED
Amphetamines: NOT DETECTED
Barbiturates: NOT DETECTED
Benzodiazepines: NOT DETECTED
Cocaine: NOT DETECTED
Opiates: NOT DETECTED
Tetrahydrocannabinol: NOT DETECTED

## 2022-10-21 LAB — I-STAT CHEM 8, ED
BUN: 3 mg/dL — ABNORMAL LOW (ref 6–20)
Calcium, Ion: 0.99 mmol/L — ABNORMAL LOW (ref 1.15–1.40)
Chloride: 99 mmol/L (ref 98–111)
Creatinine, Ser: 1 mg/dL (ref 0.61–1.24)
Glucose, Bld: 163 mg/dL — ABNORMAL HIGH (ref 70–99)
HCT: 42 % (ref 39.0–52.0)
Hemoglobin: 14.3 g/dL (ref 13.0–17.0)
Potassium: 4 mmol/L (ref 3.5–5.1)
Sodium: 139 mmol/L (ref 135–145)
TCO2: 28 mmol/L (ref 22–32)

## 2022-10-21 LAB — PROTIME-INR
INR: 1 (ref 0.8–1.2)
Prothrombin Time: 13.4 seconds (ref 11.4–15.2)

## 2022-10-21 LAB — LIPID PANEL
Cholesterol: 136 mg/dL (ref 0–200)
HDL: 31 mg/dL — ABNORMAL LOW (ref >40)
LDL Cholesterol: 68 mg/dL (ref 0–99)
Total CHOL/HDL Ratio: 4.4 RATIO
Triglycerides: 184 mg/dL — ABNORMAL HIGH (ref <150)
VLDL: 37 mg/dL (ref 0–40)

## 2022-10-21 LAB — HIV ANTIBODY (ROUTINE TESTING W REFLEX): HIV Screen 4th Generation wRfx: NONREACTIVE

## 2022-10-21 LAB — APTT: aPTT: 25 seconds (ref 24–36)

## 2022-10-21 LAB — MRSA NEXT GEN BY PCR, NASAL: MRSA by PCR Next Gen: NOT DETECTED

## 2022-10-21 LAB — ETHANOL: Alcohol, Ethyl (B): <10 mg/dL (ref <10)

## 2022-10-21 LAB — CBG MONITORING, ED: Glucose-Capillary: 158 mg/dL — ABNORMAL HIGH (ref 70–99)

## 2022-10-21 MED ORDER — ATORVASTATIN CALCIUM 80 MG PO TABS
80.0000 mg | ORAL_TABLET | Freq: Every day | ORAL | Status: DC
  Administered 2022-10-21 – 2022-10-25 (×5): 80 mg via ORAL
  Filled 2022-10-21 (×2): qty 1
  Filled 2022-10-21: qty 2
  Filled 2022-10-21 (×2): qty 1

## 2022-10-21 MED ORDER — SODIUM CHLORIDE 0.9 % IV SOLN: INTRAVENOUS | Status: DC

## 2022-10-21 MED ORDER — TAMSULOSIN HCL 0.4 MG PO CAPS
0.4000 mg | ORAL_CAPSULE | Freq: Two times a day (BID) | ORAL | Status: DC
  Administered 2022-10-21 – 2022-10-25 (×8): 0.4 mg via ORAL
  Filled 2022-10-21 (×8): qty 1

## 2022-10-21 MED ORDER — OXYCODONE HCL ER 15 MG PO T12A
15.0000 mg | EXTENDED_RELEASE_TABLET | Freq: Two times a day (BID) | ORAL | Status: DC
  Administered 2022-10-21 – 2022-10-25 (×8): 15 mg via ORAL
  Filled 2022-10-21 (×8): qty 1

## 2022-10-21 MED ORDER — INSULIN ASPART 100 UNIT/ML IJ SOLN
0.0000 [IU] | Freq: Three times a day (TID) | INTRAMUSCULAR | Status: DC
  Administered 2022-10-22 (×3): 2 [IU] via SUBCUTANEOUS
  Administered 2022-10-23 (×2): 3 [IU] via SUBCUTANEOUS
  Administered 2022-10-24 – 2022-10-25 (×4): 2 [IU] via SUBCUTANEOUS

## 2022-10-21 MED ORDER — FRUQUINTINIB 5 MG PO CAPS: 5.0000 mg | ORAL_CAPSULE | Freq: Every day | ORAL | Status: DC

## 2022-10-21 MED ORDER — INSULIN ASPART 100 UNIT/ML IJ SOLN: 0.0000 [IU] | Freq: Every day | INTRAMUSCULAR | Status: DC

## 2022-10-21 MED ORDER — LORAZEPAM 0.5 MG PO TABS
0.5000 mg | ORAL_TABLET | Freq: Three times a day (TID) | ORAL | Status: DC | PRN
  Administered 2022-10-22 – 2022-10-25 (×4): 0.5 mg via ORAL
  Filled 2022-10-21 (×4): qty 1

## 2022-10-21 MED ORDER — ORAL CARE MOUTH RINSE: 15.0000 mL | OROMUCOSAL | Status: DC | PRN

## 2022-10-21 MED ORDER — OXYCODONE HCL 5 MG PO TABS
10.0000 mg | ORAL_TABLET | Freq: Four times a day (QID) | ORAL | Status: DC | PRN
  Administered 2022-10-21 – 2022-10-25 (×9): 10 mg via ORAL
  Filled 2022-10-21 (×9): qty 2

## 2022-10-21 MED ORDER — IOHEXOL 350 MG/ML SOLN
75.0000 mL | Freq: Once | INTRAVENOUS | Status: AC | PRN
  Administered 2022-10-21: 75 mL via INTRAVENOUS

## 2022-10-21 MED ORDER — OXYCODONE-ACETAMINOPHEN 10-325 MG PO TABS: 1.0000 | ORAL_TABLET | Freq: Four times a day (QID) | ORAL | Status: DC | PRN

## 2022-10-21 MED ORDER — TENECTEPLASE FOR STROKE
0.2500 mg/kg | PACK | Freq: Once | INTRAVENOUS | Status: AC
  Administered 2022-10-21: 24 mg via INTRAVENOUS
  Filled 2022-10-21: qty 10

## 2022-10-21 MED ORDER — LEVETIRACETAM 750 MG PO TABS
750.0000 mg | ORAL_TABLET | Freq: Two times a day (BID) | ORAL | Status: DC
  Administered 2022-10-21 – 2022-10-25 (×8): 750 mg via ORAL
  Filled 2022-10-21 (×8): qty 1

## 2022-10-21 MED ORDER — STROKE: EARLY STAGES OF RECOVERY BOOK
Freq: Once | Status: AC
  Administered 2022-10-22: 1
  Filled 2022-10-21: qty 1

## 2022-10-21 MED ORDER — ACETAMINOPHEN 325 MG PO TABS
650.0000 mg | ORAL_TABLET | ORAL | Status: DC | PRN
  Administered 2022-10-22 – 2022-10-25 (×3): 650 mg via ORAL
  Filled 2022-10-21 (×4): qty 2

## 2022-10-21 MED ORDER — TIZANIDINE HCL 4 MG PO TABS
4.0000 mg | ORAL_TABLET | Freq: Three times a day (TID) | ORAL | Status: DC | PRN
  Filled 2022-10-21: qty 1

## 2022-10-21 MED ORDER — LEVOTHYROXINE SODIUM 25 MCG PO TABS
137.0000 ug | ORAL_TABLET | Freq: Every day | ORAL | Status: DC
  Administered 2022-10-22 – 2022-10-25 (×4): 137 ug via ORAL
  Filled 2022-10-21 (×4): qty 1

## 2022-10-21 MED ORDER — PANTOPRAZOLE SODIUM 40 MG PO TBEC
40.0000 mg | DELAYED_RELEASE_TABLET | Freq: Two times a day (BID) | ORAL | Status: DC
  Administered 2022-10-21 – 2022-10-25 (×8): 40 mg via ORAL
  Filled 2022-10-21 (×8): qty 1

## 2022-10-21 MED ORDER — GADOBUTROL 1 MMOL/ML IV SOLN
9.0000 mL | Freq: Once | INTRAVENOUS | Status: AC | PRN
  Administered 2022-10-21: 9 mL via INTRAVENOUS

## 2022-10-21 MED ORDER — ACETAMINOPHEN 650 MG RE SUPP: 650.0000 mg | RECTAL | Status: DC | PRN

## 2022-10-21 MED ORDER — CHLORHEXIDINE GLUCONATE CLOTH 2 % EX PADS
6.0000 | MEDICATED_PAD | Freq: Every day | CUTANEOUS | Status: DC
  Administered 2022-10-21 – 2022-10-25 (×5): 6 via TOPICAL

## 2022-10-21 MED ORDER — SENNOSIDES-DOCUSATE SODIUM 8.6-50 MG PO TABS
1.0000 | ORAL_TABLET | Freq: Every evening | ORAL | Status: DC | PRN
  Administered 2022-10-23: 1 via ORAL
  Filled 2022-10-21: qty 1

## 2022-10-21 MED ORDER — ACETAMINOPHEN 325 MG PO TABS: 325.0000 mg | ORAL_TABLET | Freq: Four times a day (QID) | ORAL | Status: DC | PRN

## 2022-10-21 MED ORDER — ACETAMINOPHEN 160 MG/5ML PO SOLN: 650.0000 mg | ORAL | Status: DC | PRN

## 2022-10-21 MED ORDER — CLEVIDIPINE BUTYRATE 0.5 MG/ML IV EMUL: 0.0000 mg/h | INTRAVENOUS | Status: DC

## 2022-10-21 NOTE — ED Provider Notes (Signed)
[place] EMERGENCY DEPARTMENT AT [facility] Provider Note   CSN: [id number] Arrival date & time: 10/21/22  1257     History {Add pertinent medical, surgical, social history, OB history to HPI:1} No chief complaint on file.   CHISTIAN KASLER is a 57 y.o. male.  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, history of documented liver metastasis, had a laparoscopic colectomy in 2017, last cycle of chemotherapy was on Oct 11, 2022, history of Graves' disease treated with radioactive iodine, history of hypertension, history of seizures and DKA on Keppra last MRI of the brain was 2021, no obvious seizure etiology identified  The patient states he cannot use his left arm, it is not working right, he is also had some facial droop, paramedics activated a code stroke from the field, blood sugar was just over 100 prehospital, noted to be severely hypertensive  Home Medications Prior to Admission medications   Medication Sig Start Date End Date Taking? Authorizing Provider  ACCU-CHEK GUIDE test strip CHECK BLOOD SUGAR THREE TIMES DAILY AS DIRECTED 08/14/20   [provider]  ACCU-CHEK SOFTCLIX LANCETS lancets USE  TID TO CHECK BLOOD SUGAR LEVELS 06/22/16   [provider]  amLODipine (NORVASC) 10 MG tablet Take 1 tablet (10 mg total) by mouth daily. 03/14/22   Azalee Course, PA  aspirin EC 81 MG tablet Take 81 mg by mouth daily.    [provider]  atorvastatin (LIPITOR) 80 MG tablet Take 1 tablet (80 mg total) by mouth daily. TAKE 1 TABLET(80 MG) BY MOUTH AT BEDTIME 03/14/22   Azalee Course, PA  chlorhexidine (PERIDEX) 0.12 % solution Use as directed 5 mLs in the mouth or throat in the morning, at noon, and at bedtime. 06/07/22    [provider]  clonazePAM (KLONOPIN) 1 MG tablet Take 1 mg by mouth 2 (two) times daily as needed.    [provider]  dapagliflozin propanediol (FARXIGA) 10 MG TABS tablet Take 10 mg by mouth daily.    [provider]  diphenoxylate-atropine (LOMOTIL) 2.5-0.025 MG tablet Take 1-2 tablets by mouth 4 (four) times daily as needed for diarrhea or loose stools. 06/06/22   Rana Snare, NP  docusate sodium (COLACE) 100 MG capsule Take 100 mg by mouth 2 (two) times daily.    [provider]  fruquintinib (FRUZAQLA) 5 MG capsule Take 1 capsule (5 mg total) by mouth daily. Take for 21 days, then hold for 7 days. Repeat every 28 days. Start cycle on 10/11/22 after MD visit 10/05/22   Ladene Artist, MD  GLOBAL EASE INJECT PEN NEEDLES 31G X 5 MM MISC Inject 1 Syringe into the skin 4 (four) times daily. 09/18/20   [provider]  hydrochlorothiazide (MICROZIDE) 12.5 MG capsule Take 12.5 mg by mouth daily. 05/28/22   [provider]  insulin aspart (NOVOLOG) 100 UNIT/ML injection Inject 12 Units into the skin 3 (three) times daily before meals. Patient taking differently: Inject 18 Units into the skin as needed. 10/26/19   Leatha Gilding, MD  Insulin Degludec (TRESIBA FLEXTOUCH [place]) Inject 80 Units into the skin daily.    [provider]  levETIRAcetam (KEPPRA) 750 MG tablet Take 1 tablet twice a day 09/07/22   Karel Jarvis,  Lesle Chris, MD  levothyroxine (SYNTHROID) 137 MCG tablet Take 137 mcg by mouth daily before breakfast. 06/01/22   [provider]  lidocaine-prilocaine (EMLA) cream Apply 1 Application topically as needed. Apply to portacath site 1 hour prior to use 10/11/22   Rana Snare, NP  LORazepam (ATIVAN) 0.5 MG tablet Take 1 tablet (0.5 mg total) by mouth every 8 (eight) hours as needed for anxiety. 02/01/22   Rana Snare, NP  meclizine (ANTIVERT) 25 MG tablet Take 25 mg by mouth 3 (three) times daily as needed for dizziness. 02/25/16    [provider]  metFORMIN (GLUCOPHAGE-XR) 500 MG 24 hr tablet Take 1,000 mg by mouth in the morning and at bedtime. 10/30/16   [provider]  metoprolol tartrate (LOPRESSOR) 50 MG tablet Take 1.5 tablets (75 mg total) by mouth 2 (two) times daily. Patient taking differently: Take 100 mg by mouth 2 (two) times daily. 03/14/22   Azalee Course, PA  mirtazapine (REMERON) 15 MG tablet Take 15 mg by mouth at bedtime. 07/29/21   [provider]  nitroGLYCERIN (NITROSTAT) 0.4 MG SL tablet DISSOLVE 1 TABLET UNDER TONGUE EVERY 5 MINUTES AS NEEDED FOR CHEST PAIN. IF NO RELIEF AFTER 3 DOSES CALL 911. 06/16/21   Iran Ouch, MD  ondansetron (ZOFRAN) 8 MG tablet Take 1 tablet (8 mg total) by mouth every 8 (eight) hours as needed for nausea or vomiting. 03/28/22   Rana Snare, NP  oxyCODONE ER [facility] ER) 13.5 MG C12A Take 13.5 mg by mouth every 12 (twelve) hours. 10/19/21   [provider]  oxyCODONE-acetaminophen (PERCOCET) 10-325 MG tablet Take 1 tablet by mouth every 6 (six) hours as needed. 12/02/20   [provider]  pantoprazole (PROTONIX) 40 MG tablet Take 40 mg by mouth 2 (two) times daily. 10/23/15   [provider]  potassium chloride SA (KLOR-CON M) 20 MEQ tablet TAKE 1 TABLET BY MOUTH DAILY. 10/03/22   Ladene Artist, MD  promethazine (PHENERGAN) 12.5 MG tablet Take 1 tablet (12.5 mg total) by mouth every 6 (six) hours as needed. 02/01/22   Rana Snare, NP  sildenafil (VIAGRA) 100 MG tablet Take 100 mg by mouth daily as needed. 04/21/21   [provider]  tamsulosin (FLOMAX) 0.4 MG CAPS capsule Take 0.4 mg by mouth 2 (two) times daily.    [provider]  tiZANidine (ZANAFLEX) 4 MG tablet Take 4 mg by mouth 3 (three) times daily. 07/22/21   [provider]  venlafaxine XR (EFFEXOR-XR) 75 MG 24 hr capsule Take 225 mg by mouth daily. 09/09/19   [provider]  Vitamin D, Ergocalciferol, (DRISDOL) 1.25 MG (50000  UNIT) CAPS capsule Take 50,000 Units by mouth once a week. 01/15/22   [provider]      Allergies    Hydrocodone    Review of Systems   Review of Systems  All other systems reviewed and are negative.   Physical Exam Updated Vital Signs There were no vitals taken for this visit. Physical Exam Vitals and nursing note reviewed.  Constitutional:      General: He is not in acute distress.    Appearance: He is well-developed.  HENT:     Head: Normocephalic and atraumatic.     Mouth/Throat:     Pharynx: No oropharyngeal exudate.  Eyes:     General: No scleral icterus.       Right eye: No discharge.        Left  eye: No discharge.     Conjunctiva/sclera: Conjunctivae normal.     Pupils: Pupils are equal, round, and reactive to light.  Neck:     Thyroid: No thyromegaly.     Vascular: No JVD.  Cardiovascular:     Rate and Rhythm: Normal rate and regular rhythm.     Heart sounds: Normal heart sounds. No murmur heard.    No friction rub. No gallop.  Pulmonary:     Effort: Pulmonary effort is normal. No respiratory distress.     Breath sounds: Normal breath sounds. No wheezing or rales.  Abdominal:     General: Bowel sounds are normal. There is no distension.     Palpations: Abdomen is soft. There is no mass.     Tenderness: There is no abdominal tenderness.  Musculoskeletal:        General: No tenderness. Normal range of motion.     Cervical back: Normal range of motion and neck supple.     Right lower leg: No edema.     Left lower leg: No edema.  Lymphadenopathy:     Cervical: No cervical adenopathy.  Skin:    General: Skin is warm and dry.     Findings: No erythema or rash.  Neurological:     Mental Status: He is alert and oriented to person, place, and time.     Coordination: Coordination normal.     Comments: Slight left-sided facial droop, the patient cannot perform finger-nose-finger on the left, he has drift of the left arm and weakness of the left grip, he  is able to lift both legs.  He has normal extraocular movements and peripheral visual fields, he has normal sensation of the bilateral legs  Psychiatric:        Behavior: Behavior normal.     ED Results / Procedures / Treatments   Labs (all labs ordered are listed, but only abnormal results are displayed) Labs Reviewed  CBG MONITORING, ED - Abnormal; Notable for the following components:      Result Value   Glucose-Capillary 158 (*)    All other components within normal limits    EKG None  Radiology No results found.  Procedures Procedures  {Document cardiac monitor, telemetry assessment procedure when appropriate:1}  Medications Ordered in ED Medications - No data to display  ED Course/ Medical Decision Making/ A&P   {   Click here for ABCD2, HEART and other calculatorsREFRESH Note before signing :1}                          Medical Decision Making   This patient presents to the ED for concern of unilateral weakness, this involves an extensive number of treatment options, and is a complaint that carries with it a high risk of complications and morbidity.  The differential diagnosis includes stroke, hemorrhage, tumor, mass, seizure   Co morbidities that complicate the patient evaluation  Prior colon cancer with metastatic disease   Additional history obtained:  Additional history obtained from medical record External records from outside source obtained and reviewed including prior surgical notes, oncology notes, chemotherapy notes   Lab Tests:  I Ordered, and personally interpreted labs.  The pertinent results include:  ***   Imaging Studies ordered:  I ordered imaging studies including ***  I independently visualized and interpreted imaging which showed *** I agree with the radiologist interpretation   Cardiac Monitoring: / EKG:  The patient was maintained on a cardiac  monitor.  I personally viewed and interpreted the cardiac monitored which showed  an underlying rhythm of: ***   Consultations Obtained:  I requested consultation with the ***,  and discussed lab and imaging findings as well as pertinent plan - they recommend: ***   Problem List / ED Course / Critical interventions / Medication management  *** I ordered medication including ***  for ***  Reevaluation of the patient after these medicines showed that the patient {resolved/improved/worsened:23923::"improved"} I have reviewed the patients home medicines and have made adjustments as needed   Social Determinants of Health:  ***   Test / Admission - Considered:  ***   {Document critical care time when appropriate:1} {Document review of labs and clinical decision tools ie heart score, Chads2Vasc2 etc:1}  {Document your independent review of radiology images, and any outside records:1} {Document your discussion with family members, caretakers, and with consultants:1} {Document social determinants of health affecting pt's care:1} {Document your decision making why or why not admission, treatments were needed:1} Final Clinical Impression(s) / ED Diagnoses Final diagnoses:  None    Rx / DC Orders ED Discharge Orders     None

## 2022-10-21 NOTE — ED Triage Notes (Signed)
LKW at 1200 noon. Code stroke with left facial droop and left sided weakness. BP 220/120.

## 2022-10-21 NOTE — Progress Notes (Signed)
PHARMACIST CODE STROKE RESPONSE  Notified to mix TNK at 1328 by Dr. Derry Lory TNK preparation completed at 1330  TNK dose = 24 mg IV over 5 seconds.   Issues/delays encountered (if applicable): Second verification of consent with significant other between preparation and administration.   Andreas Ohm, PharmD Pharmacy Resident  10/21/2022 1:35 PM

## 2022-10-21 NOTE — Code Documentation (Signed)
Stroke Response Nurse Documentation Code Documentation  Jeff [name] is a 57 y.o. male arriving to [facility]  via [facility] on 10/21/2022 with past medical hx of seizures, HTN, HLD, NSTEMI, mestatic colon cancer, DM, hypothyroidism, PAD. On No antithrombotic. Code stroke was activated by EMS.   Patient from home where he was LKW at 1200 and now complaining of left sided facial droop, left arm drift/weakness . Per EMS, patient was eating when he felt like he was having a seizure coming and felt like he was having a 'left brain cramp.' When EMS arrived, a left facial droop and left arm drift was noted. Per patients grandson, the left facial droop and left arm drift is not normal for him.   Stroke team at the bedside on patient arrival. Labs drawn and patient cleared for CT by Dr. Francene Castle. Patient to CT with team. NIHSS 3, see documentation for details and code stroke times. Patient with left facial droop, left arm weakness, and dysarthria  on exam. The following imaging was completed:  CT Head, CTA, and MRI. Patient is a candidate for IV Thrombolytic due to stroke noted on imaging. Patient is not a candidate for IR due to no LVO noted on imaging.   Care Plan: VS/NIHSS q15x2hr, q30x6hr, q1x16hr. BP Goal <180/105.  Bedside handoff with ED RN Katrina.    Felecia Jan  Stroke Response RN

## 2022-10-21 NOTE — H&P (Addendum)
Stroke Neurology Admission History & Physical   CC: CODE STROKE  History is obtained from: patient and chart review  HPI: Jeff [name] is a 57 y.o. male with PMH significant for seizures on Keppra 750 BID, HTN, HLD, NSTEMI, Metastatic colon cancer, DM2, Hypothyroidiam, PAD who was BIB EMS as a CODE STROKE due to left hemiparesis and left facial droop. BP with EMS was elevated, over 200, CBG WNL.   He was eating lunch at 1200 when it started. Felt a cramp in his head but not a headache and felt onset of his typical seizure. However, did not have a seizure.   On exam at bridge, patient is alert, oriented, mild drift seen in left arm, but does have decreased grip and decreased sensation in left hand, left facial droop present, slight dysarthria. Taken emergently to CT, no evidence of bleed. Due to patient history of metastatic cancer, MRI with and without contrast was ordered to evaluate for possible met lesion before discussing TPA possibility. MRI reviewed with radiologist as images became available, showed possible ischemic stroke and no lesion. Risks, benefits, alternatives of IV thrombolysis were discussed with patient and his wife, Jeff [name], over the phone, both agreed to proceed.   LKW: 1200 TNK given? Yes @ 1332 Mechanical thrombectomy? No, no LVO Premorbid modified Rankin scale (mRS): 0  NIHSS:  1a Level of Conscious.: 0 1b LOC Questions: 0 1c LOC Commands: 0 2 Best Gaze: 0 3 Visual: 0 4 Facial Palsy: 1 5a Motor Arm - left: 1 5b Motor Arm - Right:0  6a Motor Leg - Left: 0 6b Motor Leg - Right: 0 7 Limb Ataxia: 0 8 Sensory: 0 9 Best Language: 0 10 Dysarthria: 1 11 Extinct. and Inatten.: 0 TOTAL: 3   ROS: Full ROS was performed and is negative except as noted in the HPI.    Past Medical History:  Diagnosis Date   Allergic rhinitis    Anxiety    Arthritis    Chemotherapy-induced thrombocytopenia    Chronic nausea    mild intermittant and loose stools due to colon  cancer   Chronic neck and back pain    Depression    ED (erectile dysfunction)    Generalized idiopathic epilepsy and epileptic syndromes, not intractable, without status epilepticus (HCC) 10/25/2019   (11-10-2020 per pt wife last seizure 11/ 2021)  neurologist-- dr Karel Jarvis---  seizure activity/ DKA ;  EEG w/ evidence epileto genicity arising right frontocentral region;  taking keppra   GERD (gastroesophageal reflux disease)    Hiatal hernia    History of 2019 novel coronavirus disease (COVID-19) 12/13/2018   per pt wife , pt had moderate symtptoms that resolved   History of Graves' disease    s/p RAI 02/ 2012;  previous seen by endocrinologist--- dr Everardo All   History of kidney stones    History of non-ST elevation myocardial infarction (NSTEMI)    Jan 2014--  no CAD;  per notes probable coronary vasospasm   History of panic attacks    History of thyroid storm    Nov 2011 w/ grave's ,  s/p RAI 02/ 2012   Hyperlipidemia    Hypertension    Hypothyroidism following radioiodine therapy    RAI in 02/  2012---  previously followed by dr Everardo All,  now followed by pcp   Lower urinary tract symptoms (LUTS)    Metastatic colon cancer to liver [facility]) 11/2015   oncologist-- sherrill/ and seen by dr a. Maisie Fus;  01-14-2016 s/p sigmoid colectomy,  liver bx, node dissection, PAC;   chemo started 09/ 2017  ;  04/ 2021 metastatic pulmonary nodules;   active treatment every 2 wks   Nephrolithiasis    left side non-obstructive per imaging 04/ 2022   PAD (peripheral artery disease) [facility]) cardiologist-  dr Kirke Corin   cardiologist ----  dr Judie Petit. Kirke Corin ;   left popliteal occlusion behind knee w/ collaterals;  s/p attempted angioplasty 01/ 2016;  09-11-2015 s/p pop-TPT w/ ipsNR GSV   PONV (postoperative nausea and vomiting)    Rash    due to chemo   Type 2 diabetes mellitus treated with insulin (HCC)    followed by pcp----   (11-10-2020  per pt wife blood sugar check's 2-4 times daily,  fasting sugar--- 130--168)    Unspecified fracture of left foot, initial encounter for closed fracture 10/17/2020   midfoot   Family History  Problem Relation Age of Onset   Thyroid disease Mother        hypothyroidism   Heart attack Maternal Grandfather    Heart Problems Father        pacermaker   Edema Father    Heart disease Maternal Grandmother    Lung cancer Maternal Grandmother    Diabetes Maternal Grandmother    Hypertension Maternal Grandmother    Social History:   reports that he has never smoked. He has never used smokeless tobacco. He reports current alcohol use. He reports that he does not use drugs.  Medications  Current Facility-Administered Medications:    tenecteplase (TNKASE) injection for Stroke 24 mg, 0.25 mg/kg, Intravenous, Once, Erick Blinks, MD  Current Outpatient Medications:    ACCU-CHEK GUIDE test strip, CHECK BLOOD SUGAR THREE TIMES DAILY AS DIRECTED, Disp: , Rfl:    ACCU-CHEK SOFTCLIX LANCETS lancets, USE  TID TO CHECK BLOOD SUGAR LEVELS, Disp: , Rfl: 0   amLODipine (NORVASC) 10 MG tablet, Take 1 tablet (10 mg total) by mouth daily., Disp: 90 tablet, Rfl: 3   aspirin EC 81 MG tablet, Take 81 mg by mouth daily., Disp: , Rfl:    atorvastatin (LIPITOR) 80 MG tablet, Take 1 tablet (80 mg total) by mouth daily. TAKE 1 TABLET(80 MG) BY MOUTH AT BEDTIME, Disp: 90 tablet, Rfl: 3   chlorhexidine (PERIDEX) 0.12 % solution, Use as directed 5 mLs in the mouth or throat in the morning, at noon, and at bedtime., Disp: , Rfl:    clonazePAM (KLONOPIN) 1 MG tablet, Take 1 mg by mouth 2 (two) times daily as needed., Disp: , Rfl:    dapagliflozin propanediol (FARXIGA) 10 MG TABS tablet, Take 10 mg by mouth daily., Disp: , Rfl:    diphenoxylate-atropine (LOMOTIL) 2.5-0.025 MG tablet, Take 1-2 tablets by mouth 4 (four) times daily as needed for diarrhea or loose stools., Disp: 40 tablet, Rfl: 0   docusate sodium (COLACE) 100 MG capsule, Take 100 mg by mouth 2 (two) times daily., Disp: , Rfl:     fruquintinib (FRUZAQLA) 5 MG capsule, Take 1 capsule (5 mg total) by mouth daily. Take for 21 days, then hold for 7 days. Repeat every 28 days. Start cycle on 10/11/22 after MD visit, Disp: 21 capsule, Rfl: 0   GLOBAL EASE INJECT PEN NEEDLES 31G X 5 MM MISC, Inject 1 Syringe into the skin 4 (four) times daily., Disp: , Rfl:    hydrochlorothiazide (MICROZIDE) 12.5 MG capsule, Take 12.5 mg by mouth daily., Disp: , Rfl:    insulin aspart (NOVOLOG) 100 UNIT/ML injection, Inject 12 Units into  the skin 3 (three) times daily before meals. (Patient taking differently: Inject 18 Units into the skin as needed.), Disp: 12 mL, Rfl: 0   Insulin Degludec (TRESIBA FLEXTOUCH [place]), Inject 80 Units into the skin daily., Disp: , Rfl:    levETIRAcetam (KEPPRA) 750 MG tablet, Take 1 tablet twice a day, Disp: 180 tablet, Rfl: 3   levothyroxine (SYNTHROID) 137 MCG tablet, Take 137 mcg by mouth daily before breakfast., Disp: , Rfl:    lidocaine-prilocaine (EMLA) cream, Apply 1 Application topically as needed. Apply to portacath site 1 hour prior to use, Disp: 30 g, Rfl: 3   LORazepam (ATIVAN) 0.5 MG tablet, Take 1 tablet (0.5 mg total) by mouth every 8 (eight) hours as needed for anxiety., Disp: 30 tablet, Rfl: 0   meclizine (ANTIVERT) 25 MG tablet, Take 25 mg by mouth 3 (three) times daily as needed for dizziness., Disp: , Rfl: 1   metFORMIN (GLUCOPHAGE-XR) 500 MG 24 hr tablet, Take 1,000 mg by mouth in the morning and at bedtime., Disp: , Rfl: 3   metoprolol tartrate (LOPRESSOR) 50 MG tablet, Take 1.5 tablets (75 mg total) by mouth 2 (two) times daily. (Patient taking differently: Take 100 mg by mouth 2 (two) times daily.), Disp: 270 tablet, Rfl: 3   mirtazapine (REMERON) 15 MG tablet, Take 15 mg by mouth at bedtime., Disp: , Rfl:    nitroGLYCERIN (NITROSTAT) 0.4 MG SL tablet, DISSOLVE 1 TABLET UNDER TONGUE EVERY 5 MINUTES AS NEEDED FOR CHEST PAIN. IF NO RELIEF AFTER 3 DOSES CALL 911., Disp: 25 tablet, Rfl: 5   ondansetron  (ZOFRAN) 8 MG tablet, Take 1 tablet (8 mg total) by mouth every 8 (eight) hours as needed for nausea or vomiting., Disp: 30 tablet, Rfl: 1   oxyCODONE ER (XTAMPZA ER) 13.5 MG C12A, Take 13.5 mg by mouth every 12 (twelve) hours., Disp: , Rfl:    oxyCODONE-acetaminophen (PERCOCET) 10-325 MG tablet, Take 1 tablet by mouth every 6 (six) hours as needed., Disp: , Rfl:    pantoprazole (PROTONIX) 40 MG tablet, Take 40 mg by mouth 2 (two) times daily., Disp: , Rfl: 5   potassium chloride SA (KLOR-CON M) 20 MEQ tablet, TAKE 1 TABLET BY MOUTH DAILY., Disp: 30 tablet, Rfl: 0   promethazine (PHENERGAN) 12.5 MG tablet, Take 1 tablet (12.5 mg total) by mouth every 6 (six) hours as needed., Disp: 30 tablet, Rfl: 2   sildenafil (VIAGRA) 100 MG tablet, Take 100 mg by mouth daily as needed., Disp: , Rfl:    tamsulosin (FLOMAX) 0.4 MG CAPS capsule, Take 0.4 mg by mouth 2 (two) times daily., Disp: , Rfl:    tiZANidine (ZANAFLEX) 4 MG tablet, Take 4 mg by mouth 3 (three) times daily., Disp: , Rfl:    venlafaxine XR (EFFEXOR-XR) 75 MG 24 hr capsule, Take 225 mg by mouth daily., Disp: , Rfl:    Vitamin D, Ergocalciferol, (DRISDOL) 1.25 MG (50000 UNIT) CAPS capsule, Take 50,000 Units by mouth once a week., Disp: , Rfl:   Facility-Administered Medications Ordered in Other Encounters:    alteplase (CATHFLO ACTIVASE) injection 2 mg, 2 mg, Intracatheter, Once PRN, Ladene Artist, MD   sodium chloride flush (NS) 0.9 % injection 10 mL, 10 mL, Intravenous, PRN, Thornton Papas B, MD, 10 mL at 02/04/16 1431   sodium chloride flush (NS) 0.9 % injection 10 mL, 10 mL, Intravenous, PRN, Thornton Papas B, MD, 10 mL at 10/18/16 0938   sodium chloride flush (NS) 0.9 % injection 10 mL, 10  mL, Intravenous, PRN, Ladene Artist, MD, 10 mL at 05/03/17 0855   Exam: Current vital signs: BP (!) 166/91   Pulse 79   Resp 15   Ht 5\' 10"  (1.778 m)   Wt 97 kg   BMI 30.68 kg/m  Vital signs in last 24 hours: Pulse Rate:  [79] 79 (06/07  1306) Resp:  [15] 15 (06/07 1306) BP: (150-166)/(91-101) 166/91 (06/07 1317) Weight:  [97 kg] 97 kg (06/07 1300)  GENERAL: Awake, alert in NAD HEENT: - Normocephalic and atraumatic, dry mm LUNGS - Clear to auscultation bilaterally with no wheezes CV - S1S2 RRR, no m/r/g, equal pulses bilaterally. ABDOMEN - Soft, nontender, nondistended with normoactive BS Ext: warm, well perfused, intact peripheral pulses,no edema  NEURO:  Mental Status: AA&Ox3  Language: slight dysarthria present.  Naming, repetition, fluency, and comprehension intact. Cranial Nerves: PERRL, EOMI, visual fields full, Left Facial Droop, facial sensation intact, hearing intact, tongue/uvula/soft palate midline, normal sternocleidomastoid and trapezius muscle strength. No evidence of tongue atrophy or fibrillations Motor: No drift present in any extremity. Decreased grip left hand.  Tone: is normal and bulk is normal Sensation- Intact to light touch bilaterally Coordination: Ataxia present to LUE, when completing FNT Gait- deferred  Labs I have reviewed labs in epic and the results pertinent to this consultation are:  CBC    Component Value Date/Time   WBC 8.0 10/21/2022 1305   RBC 4.68 10/21/2022 1305   HGB 13.3 10/21/2022 1305   HGB 11.8 (L) 10/11/2022 0833   HGB 13.8 05/03/2017 0849   HCT 42.3 10/21/2022 1305   HCT 42.6 05/03/2017 0849   PLT 138 (L) 10/21/2022 1305   PLT 124 (L) 10/11/2022 0833   PLT 143 05/03/2017 0849   MCV 90.4 10/21/2022 1305   MCV 93.4 05/03/2017 0849   MCH 28.4 10/21/2022 1305   MCHC 31.4 10/21/2022 1305   RDW 15.1 10/21/2022 1305   RDW 20.1 (H) 05/03/2017 0849   LYMPHSABS 2.7 10/21/2022 1305   LYMPHSABS 2.3 05/03/2017 0849   MONOABS 0.6 10/21/2022 1305   MONOABS 0.5 05/03/2017 0849   EOSABS 0.1 10/21/2022 1305   EOSABS 0.2 05/03/2017 0849   BASOSABS 0.0 10/21/2022 1305   BASOSABS 0.0 05/03/2017 0849   CMP     Component Value Date/Time   NA 139 10/21/2022 1304   NA 138  05/03/2017 0849   K 4.0 10/21/2022 1304   K 3.7 05/03/2017 0849   CL 99 10/21/2022 1304   CO2 25 10/11/2022 0833   CO2 25 05/03/2017 0849   GLUCOSE 163 (H) 10/21/2022 1304   GLUCOSE 185 (H) 05/03/2017 0849   BUN 3 (L) 10/21/2022 1304   BUN 10.4 05/03/2017 0849   CREATININE 1.00 10/21/2022 1304   CREATININE 1.01 10/11/2022 0833   CREATININE 1.3 05/03/2017 0849   CALCIUM 8.9 10/11/2022 0833   CALCIUM 8.6 05/03/2017 0849   PROT 7.1 10/11/2022 0833   PROT 7.5 05/03/2017 0849   ALBUMIN 3.8 10/11/2022 0833   ALBUMIN 3.8 05/03/2017 0849   AST 40 10/11/2022 0833   AST 36 (H) 05/03/2017 0849   ALT 34 10/11/2022 0833   ALT 52 05/03/2017 0849   ALKPHOS 73 10/11/2022 0833   ALKPHOS 85 05/03/2017 0849   BILITOT 0.4 10/11/2022 0833   BILITOT 0.73 05/03/2017 0849   GFRNONAA >60 10/11/2022 0833   GFRAA >60 01/30/2020 0944    Lipid Panel     Component Value Date/Time   CHOL 139 04/16/2019 0842   TRIG 210 (  H) 04/16/2019 0842   HDL 36 (L) 04/16/2019 0842   CHOLHDL 3.9 04/16/2019 0842   CHOLHDL 2.5 06/12/2012 0637   VLDL 19 06/12/2012 0637   LDLCALC 68 04/16/2019 0842   LDLDIRECT 43 06/11/2012 2246    Imaging I have reviewed the images obtained:  CT-head: No hemorrhage or CT evidence of an acute cortical infarct. Aspects 10.  MRI examination of the brain with/without: Acute infarcts in the right precentral gyrus and pre motor area. No evidence of intracranial metastatic disease.  Ct Angio Head & Neck: No intracranial large vessel occlusion or significant stenosis. No hemodynamically significant stenosis in the neck.   Assessment/Plan:   Acute Ischemic Infarct Right precentral gyrus  Acuity: Acute Current Suspected Etiology: likely Hypertensive Emergency, pending stroke workup  -Admit to: ICU  -Hold Aspirin until 24 hour post IV thrombolysis (TNKase) neuroimaging is stable and without evidence of bleeding - Blood Pressure Goal: BP less than 180/105 -MRI/ECHO/A1C/Lipid  panel. -Hyperglycemia management per SSI to maintain glucose 140-180mg /dL. -PT/OT/ST therapies and recommendations when able  CNS History of Seizures - Continue home Keppra 750QD, IV until able to swallow  Dysarthria -NPO until cleared by speech -ST -Advance diet as tolerated  CV Essential (primary) hypertension Hypertensive Emergency - Aggressive BP control, Blood Pressure Goal: BP less than 180/105 - Cleviprex gtt ordered - Resume oral home meds once able to take  Hyperlipidemia, unspecified  - Statin for goal LDL < 70  HEME Thrombocytopenia - Platelets 138, baseline with previous labs - Trend and Monitor  ENDO Type 2 diabetes mellitus with hyperglycemia  -SSI -Start oral meds -goal HgbA1c < 7  Hypothyroidism - Continue home synthroid once able to swallow  Nutrition E66.9 Obesity (BMI 30.68) -diet consult  Prophylaxis DVT:  SCDs GI: protonix Bowel: senna  Diet: NPO until cleared by speech  Code Status: Full code   THE FOLLOWING WERE PRESENT ON ADMISSION: CNS -  Acute Ischemic Stroke, Hx of Seizures Cardiovascular -Hypertensive Emergency Cancer - Hx of Metastatic Cancer   Risks, benefits, alternatives of IV thrombolysis were discussed and family/patient agreed to proceed. CT imaging was reviewed personally prior to IV thrombolysis administration with no evidence of bleed.   Pt seen by Neuro NP/APP and later by MD. Note/plan to be edited by MD as needed.    Lynnae January, DNP, AGACNP-BC [facility] Please use AMION for contact information & EPIC for messaging.  NEUROHOSPITALIST ADDENDUM Performed a face to face diagnostic evaluation.   I have reviewed the contents of history and physical exam as documented by PA/ARNP/Resident and agree with above documentation.  I have discussed and formulated the above plan as documented. Edits to the note have been made as needed.  Impression/Key exam findings/Plan: Jeff [name] has a hx of seizures  with R frontocentral spikes noted on prior EEG from 10/25/19 and hx of metastatic colon cancer with no known brain mets. He presents with L hand weakness, mild L facial droop and slurred speech. Symptoms were persistent and started at noon when he was having some food. Felt a cramp in his head and felt like he was going to have a seizure. His BP was elevated to 240s systolic per EMS. Given his high risk history with both seizures, hx of metastatic colon cancer, hs of thromboctyopenia, I felt that getting an MRI Brain to rule out small met, would be helpful prior to offering tnkase. He was thus taken to MRI which was concerning for a cortical R precentral gyrus and premotor  area.  I discussed tnkase with him and despite NIHSS of 3 with mild L facial droop, slurred speech and L arm drift, he had little to no movement in his L hand. He is unable to abduct and adduct his L hand or make a hand grip. He opted for tnkase given disabling nature of his L hand motor deficit.  CTA with no LVO and thus not a candidate for thrombectomy.  Will admit to Neuro ICU with close post tnkase monitoring and neuro checks with stroke workup.  This patient is critically ill and at significant risk of neurological worsening, death and care requires constant monitoring of vital signs, hemodynamics,respiratory and cardiac monitoring, neurological assessment, discussion with family, other specialists and medical decision making of high complexity. I spent 60 minutes of neurocritical care time  in the care of  this patient. This was time spent independent of any time provided by nurse practitioner or PA.  Erick Blinks [facility] 10/21/2022  2:49 PM  Erick Blinks, MD [facility] [phone]   If 7pm to 7am, please call on call as listed on AMION.

## 2022-10-21 NOTE — ED Notes (Signed)
Taken to MRI by stroke team.

## 2022-10-21 NOTE — Progress Notes (Signed)
Prior-To-Admission Oral Chemotherapy for Treatment of Oncologic Disease   Order noted from Dr. Derry Lory to continue prior-to-admission oral chemotherapy regimen of fruquintinib.  Procedure Per Pharmacy & Therapeutics Committee Policy: Orders for continuation of home oral chemotherapy for treatment of an oncologic disease will be held unless approved by an oncologist during current admission.    For patients receiving oncology care at [facility], inpatient pharmacist contacts patient's oncologist during regular office hours to review. If earlier review is medically necessary, attending physician consults [facility] on-call oncologist   For patients receiving oncology care outside of [facility], attending physician consults patient's oncologist to review. If this oncologist or their coverage cannot be reached, attending physician consults [facility] on-call oncologist  Oral chemotherapy continuation order is on hold after oncologist review,  [facility] oncologist Lonna Cobb, NP was notified by inpatient pharmacy. Oncology recommended to hold fruquitinib during inpatient admission.   Estill Batten, PharmD, BCCCP  10/21/2022, 3:07 PM

## 2022-10-21 NOTE — ED Notes (Signed)
Left arm weakness has greatly improved at this time. Still not equal bilaterally. NIHS is now down to 1. Cardiac monitoring in place.

## 2022-10-21 NOTE — Progress Notes (Signed)
eLink Physician-Brief Progress Note Patient Name: KENT RIENDEAU DOB: 1966/03/04 MRN: [id number]   Date of Service  10/21/2022  HPI/Events of Note  57 year old male with a history of seizures, send hypertension, dyslipidemia and previous NSTEMI with active metastatic colon cancer was brought in by EMS due to left-sided hemiparesis and facial droop found to have acute stroke.  Underwent tenecteplase administration at 1332 and MR angiography without evidence of LVO.  On presentation, vital signs within normal limits.  Saturating 94% on room air  Laboratory studies show hyperglycemia and mild thrombocytopenia with glucosuria.  MRI brain without evidence of acute intracranial metastases.  CT angio without LVO or significant stenosis.  Strokes are demonstrated on MR and CT stroke.  eICU Interventions  Blood pressure goal less than 180 systolic, 105 diastolic  Stroke risk factor reduction labs ordered  Antiplatelet agents per neurology, held now in the setting of TNK  Home Keppra continued in IV form  GI prophylaxis not indicated.  Home pantoprazole  Chemical DVT prophylaxis held in the setting of TNK.  Maintain SCDs.     Intervention Category Evaluation Type: New Patient Evaluation  Brynnleigh Mcelwee 10/21/2022, 8:42 PM

## 2022-10-22 ENCOUNTER — Inpatient Hospital Stay (HOSPITAL_COMMUNITY): Payer: Medicaid Other

## 2022-10-22 ENCOUNTER — Other Ambulatory Visit: Payer: Self-pay

## 2022-10-22 DIAGNOSIS — I6389 Other cerebral infarction: Secondary | ICD-10-CM

## 2022-10-22 DIAGNOSIS — C189 Malignant neoplasm of colon, unspecified: Secondary | ICD-10-CM | POA: Diagnosis not present

## 2022-10-22 DIAGNOSIS — I639 Cerebral infarction, unspecified: Secondary | ICD-10-CM

## 2022-10-22 LAB — HEMOGLOBIN A1C
Hgb A1c MFr Bld: 7.5 % — ABNORMAL HIGH (ref 4.8–5.6)
Mean Plasma Glucose: 168.55 mg/dL

## 2022-10-22 LAB — HEPATIC FUNCTION PANEL
ALT: 42 U/L (ref 0–44)
AST: 43 U/L — ABNORMAL HIGH (ref 15–41)
Albumin: 3.3 g/dL — ABNORMAL LOW (ref 3.5–5.0)
Alkaline Phosphatase: 74 U/L (ref 38–126)
Bilirubin, Direct: 0.2 mg/dL (ref 0.0–0.2)
Indirect Bilirubin: 0.7 mg/dL (ref 0.3–0.9)
Total Bilirubin: 0.9 mg/dL (ref 0.3–1.2)
Total Protein: 6.9 g/dL (ref 6.5–8.1)

## 2022-10-22 LAB — BASIC METABOLIC PANEL
Anion gap: 12 (ref 5–15)
BUN: 6 mg/dL (ref 6–20)
CO2: 25 mmol/L (ref 22–32)
Calcium: 8.2 mg/dL — ABNORMAL LOW (ref 8.9–10.3)
Chloride: 100 mmol/L (ref 98–111)
Creatinine, Ser: 1.09 mg/dL (ref 0.61–1.24)
GFR, Estimated: >60 mL/min (ref >60)
Glucose, Bld: 121 mg/dL — ABNORMAL HIGH (ref 70–99)
Potassium: 3.8 mmol/L (ref 3.5–5.1)
Sodium: 137 mmol/L (ref 135–145)

## 2022-10-22 LAB — CBC
HCT: 40.4 % (ref 39.0–52.0)
Hemoglobin: 13 g/dL (ref 13.0–17.0)
MCH: 28.3 pg (ref 26.0–34.0)
MCHC: 32.2 g/dL (ref 30.0–36.0)
MCV: 88 fL (ref 80.0–100.0)
Platelets: 123 10*3/uL — ABNORMAL LOW (ref 150–400)
RBC: 4.59 MIL/uL (ref 4.22–5.81)
RDW: 15.2 % (ref 11.5–15.5)
WBC: 7.4 10*3/uL (ref 4.0–10.5)
nRBC: 0 % (ref 0.0–0.2)

## 2022-10-22 LAB — ECHOCARDIOGRAM COMPLETE
AR max vel: 3.6 cm2
AV Area VTI: 4.01 cm2
AV Area mean vel: 3.56 cm2
AV Mean grad: 2 mmHg
AV Peak grad: 3.5 mmHg
Ao pk vel: 0.94 m/s
Area-P 1/2: 2.91 cm2
Height: 70 in
S' Lateral: 3.2 cm
Weight: 3421.54 oz

## 2022-10-22 LAB — GLUCOSE, CAPILLARY
Glucose-Capillary: 139 mg/dL — ABNORMAL HIGH (ref 70–99)
Glucose-Capillary: 137 mg/dL — ABNORMAL HIGH (ref 70–99)
Glucose-Capillary: 124 mg/dL — ABNORMAL HIGH (ref 70–99)
Glucose-Capillary: 144 mg/dL — ABNORMAL HIGH (ref 70–99)

## 2022-10-22 MED ORDER — PNEUMOCOCCAL 20-VAL CONJ VACC 0.5 ML IM SUSY: 0.5000 mL | PREFILLED_SYRINGE | INTRAMUSCULAR | Status: DC | PRN

## 2022-10-22 MED ORDER — IOHEXOL 9 MG/ML PO SOLN
500.0000 mL | ORAL | Status: AC
  Administered 2022-10-22 (×2): 500 mL via ORAL

## 2022-10-22 MED ORDER — ENOXAPARIN SODIUM 40 MG/0.4ML IJ SOSY
40.0000 mg | PREFILLED_SYRINGE | INTRAMUSCULAR | Status: DC
  Administered 2022-10-22: 40 mg via SUBCUTANEOUS
  Filled 2022-10-22: qty 0.4

## 2022-10-22 MED ORDER — ASPIRIN 81 MG PO TBEC
81.0000 mg | DELAYED_RELEASE_TABLET | Freq: Every day | ORAL | Status: DC
  Administered 2022-10-22 – 2022-10-23 (×2): 81 mg via ORAL
  Filled 2022-10-22 (×2): qty 1

## 2022-10-22 MED ORDER — IOHEXOL 350 MG/ML SOLN
75.0000 mL | Freq: Once | INTRAVENOUS | Status: AC | PRN
  Administered 2022-10-22: 75 mL via INTRAVENOUS

## 2022-10-22 MED ORDER — CLOPIDOGREL BISULFATE 75 MG PO TABS
75.0000 mg | ORAL_TABLET | Freq: Every day | ORAL | Status: DC
  Administered 2022-10-22 – 2022-10-23 (×2): 75 mg via ORAL
  Filled 2022-10-22 (×2): qty 1

## 2022-10-22 NOTE — Plan of Care (Addendum)
Patient remains in Neuro ICU at time of writing but has trasnfer orders for "Telemetry Medical". Plan for CT ABD with contrast overnight. Intermittent sinus tachy without c/o pain, nausea, other. NIHSS = 1 for decreased sensation of left hand, unchanged since CVA per patient.   Edit 2300 hrs: patient transported to/from CT scan on tele with transporter. No acute events.  Problem: Education: Goal: Knowledge of disease or condition will improve Outcome: Progressing Goal: Knowledge of secondary prevention will improve (MUST DOCUMENT ALL) Outcome: Progressing Goal: Knowledge of patient specific risk factors will improve Loraine Leriche N/A or DELETE if not current risk factor) Outcome: Progressing   Problem: Ischemic Stroke/TIA Tissue Perfusion: Goal: Complications of ischemic stroke/TIA will be minimized Outcome: Progressing   Problem: Coping: Goal: Will verbalize positive feelings about self Outcome: Progressing Goal: Will identify appropriate support needs Outcome: Progressing   Problem: Health Behavior/Discharge Planning: Goal: Ability to manage health-related needs will improve Outcome: Progressing Goal: Goals will be collaboratively established with patient/family Outcome: Progressing   Problem: Self-Care: Goal: Ability to participate in self-care as condition permits will improve Outcome: Progressing Goal: Verbalization of feelings and concerns over difficulty with self-care will improve Outcome: Progressing Goal: Ability to communicate needs accurately will improve Outcome: Progressing   Problem: Nutrition: Goal: Risk of aspiration will decrease Outcome: Progressing Goal: Dietary intake will improve Outcome: Progressing   Problem: Education: Goal: Ability to describe self-care measures that may prevent or decrease complications (Diabetes Survival Skills Education) will improve Outcome: Progressing Goal: Individualized Educational Video(s) Outcome: Progressing   Problem:  Coping: Goal: Ability to adjust to condition or change in health will improve Outcome: Progressing   Problem: Fluid Volume: Goal: Ability to maintain a balanced intake and output will improve Outcome: Progressing   Problem: Health Behavior/Discharge Planning: Goal: Ability to identify and utilize available resources and services will improve Outcome: Progressing Goal: Ability to manage health-related needs will improve Outcome: Progressing   Problem: Metabolic: Goal: Ability to maintain appropriate glucose levels will improve Outcome: Progressing   Problem: Nutritional: Goal: Maintenance of adequate nutrition will improve Outcome: Progressing Goal: Progress toward achieving an optimal weight will improve Outcome: Progressing   Problem: Skin Integrity: Goal: Risk for impaired skin integrity will decrease Outcome: Progressing   Problem: Tissue Perfusion: Goal: Adequacy of tissue perfusion will improve Outcome: Progressing

## 2022-10-22 NOTE — Evaluation (Addendum)
Occupational Therapy Evaluation Patient Details Name: Jeff [name] MRN: [id number] DOB: March 21, 1966 Today's Date: 10/22/2022   History of Present Illness 57 yo male presenting via EMS 6/7 with L hemiparesis and L facial droop. 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 TNK given 6/7 at 1332. PMH includes: seizures, NSTEMI, metastatic colon CA, DM II, PAD, and hypothroidism.   Clinical Impression   PT admitted with R precnetral gyrus and pre motor area infarcts on MRI. Pt currently with functional limitiations due to the deficits listed below (see OT problem list). Pt prior to admssion was home independent. Pt now with L hand deficits that limits use of L UE.  Pt will benefit from skilled OT to increase their independence and safety with adls and balance to allow discharge outpatient hand therapy.    *pt with good return of L UE started and could possibly benefit from night time resting hand splint.    Recommendations for follow up therapy are one component of a multi-disciplinary discharge planning process, led by the attending physician.  Recommendations may be updated based on patient status, additional functional criteria and insurance authorization.   Assistance Recommended at Discharge Set up Supervision/Assistance  Patient can return home with the following Assist for transportation    Functional Status Assessment  Patient has had a recent decline in their functional status and demonstrates the ability to make significant improvements in function in a reasonable and predictable amount of time.  Equipment Recommendations  None recommended by OT    Recommendations for Other Services       Precautions / Restrictions Precautions Precautions: None      Mobility Bed Mobility Overal bed mobility: Needs Assistance Bed Mobility: Rolling, Supine to Sit Rolling: Min assist   Supine to sit: Min assist     General bed mobility comments: pt progressed  to eob sitting with initial (A) for balance with posterior lean. pt progressed toward eob with feet on floor and increased balance    Transfers Overall transfer level: Needs assistance   Transfers: Sit to/from Stand Sit to Stand: Min assist           General transfer comment: pt able to power up and sustain static standing.      Balance Overall balance assessment: Mild deficits observed, not formally tested                               Standardized Balance Assessment Standardized Balance Assessment :  (see PT note)         ADL either performed or assessed with clinical judgement   ADL Overall ADL's : Needs assistance/impaired Eating/Feeding: Set up;Sitting   Grooming: Set up;Sitting   Upper Body Bathing: Minimal assistance   Lower Body Bathing: Minimal assistance   Upper Body Dressing : Minimal assistance   Lower Body Dressing: Minimal assistance   Toilet Transfer: Min guard           Functional mobility during ADLs: Min guard       Vision Baseline Vision/History: 0 No visual deficits Ability to See in Adequate Light: 0 Adequate Patient Visual Report: No change from baseline Vision Assessment?: No apparent visual deficits     Perception     Praxis      Pertinent Vitals/Pain Pain Assessment Pain Assessment: Faces Faces Pain Scale: Hurts a little bit Pain Location: back and neck (chronic) Pain Descriptors / Indicators: Discomfort Pain  Intervention(s): Limited activity within patient's tolerance, Monitored during session     Hand Dominance Right   Extremity/Trunk Assessment Upper Extremity Assessment Upper Extremity Assessment: LUE deficits/detail LUE Deficits / Details: pt demonstrates full active ROM of scapula, shoulder flexion/ extension, elbow flexion and extension, wrist extension. Pt demonstrates decreased digit extension and flexion. pt with ~5-10 second delay to initiate the task. pt once initiated with loose grasp. pt  with sensation changes with increased numbness toward DIP of digits LUE Sensation: decreased light touch LUE Coordination: decreased fine motor;decreased gross motor   Lower Extremity Assessment Lower Extremity Assessment: Defer to PT evaluation   Cervical / Trunk Assessment Cervical / Trunk Assessment: Other exceptions (hx of back pain)   Communication Communication Communication: No difficulties   Cognition Arousal/Alertness: Awake/alert Behavior During Therapy: WFL for tasks assessed/performed Overall Cognitive Status: Within Functional Limits for tasks assessed                                       General Comments  VSS on RA    Exercises Exercises: Other exercises Other Exercises Other Exercises: Educated on PNF D1 pattern and provided a squeeze ball for digit flexion and extension. pt educated on not using the ball to complete the task but clearly opening to grasp ball and then releasing on command. Other Exercises: Access Code: [email or web address]   Shoulder Instructions      Home Living Family/patient expects to be discharged to:: Private residence Living Arrangements: Spouse/significant other Available Help at Discharge: Family;Available PRN/intermittently Type of Home: House Home Access: Stairs to enter [employer] of Steps: 2 Entrance Stairs-Rails: None Home Layout: One level     Bathroom Shower/Tub: Chief Strategy Officer: Standard     Home Equipment: Cane - single point          Prior Functioning/Environment Prior Level of Function : Independent/Modified Independent;Driving;History of Falls (last six months)             Mobility Comments: independent without DME ADLs Comments: independent        OT Problem List: Decreased strength;Decreased activity tolerance;Impaired balance (sitting and/or standing);Decreased range of motion;Impaired sensation;Impaired UE functional use      OT Treatment/Interventions:  Self-care/ADL training;Therapeutic exercise;Neuromuscular education;Energy conservation;DME and/or AE instruction;Therapeutic activities;Balance training;Patient/family education    OT Goals(Current goals can be found in the care plan section) Acute Rehab OT Goals Patient Stated Goal: to get use of L hand back OT Goal Formulation: With patient/family Time For Goal Achievement: 11/05/22 Potential to Achieve Goals: Good  OT Frequency: Min 2X/week    Co-evaluation              AM-PAC OT "6 Clicks" Daily Activity     Outcome Measure Help from another person eating meals?: A Little Help from another person taking care of personal grooming?: A Little Help from another person toileting, which includes using toliet, bedpan, or urinal?: A Little Help from another person bathing (including washing, rinsing, drying)?: A Little Help from another person to put on and taking off regular upper body clothing?: A Little Help from another person to put on and taking off regular lower body clothing?: A Little 6 Click Score: 18   End of Session Equipment Utilized During Treatment: Gait belt Nurse Communication: Mobility status;Precautions  Activity Tolerance: Patient tolerated treatment well Patient left: in chair;with call bell/phone within reach;with family/visitor present  OT Visit Diagnosis:  Unsteadiness on feet (R26.81);Muscle weakness (generalized) (M62.81)                Time: 1610-9604 OT Time Calculation (min): 31 min Charges:  OT General Charges $OT Visit: 1 Visit OT Evaluation $OT Eval Moderate Complexity: 1 Mod   Brynn, OTR/L  Acute Rehabilitation Services Office: [phone] .   Mateo Flow 10/22/2022, 1:43 PM

## 2022-10-22 NOTE — Progress Notes (Signed)
STROKE TEAM PROGRESS NOTE   SUBJECTIVE (INTERVAL HISTORY) His wife is at the bedside.  Overall his condition is gradually improving. Pt lying in bed, echo tech is performing 2D echo.  Patient is to have mild left facial droop and left arm weakness, much improved from yesterday.  MRI showed right MCA scattered infarcts.   OBJECTIVE Temp:  [97.4 F (36.3 C)-98 F (36.7 C)] 97.7 F (36.5 C) (06/08 1200) Pulse Rate:  [72-142] 80 (06/08 1500) Cardiac Rhythm: Normal sinus rhythm (06/08 0800) Resp:  [10-24] 16 (06/08 1500) BP: (108-169)/(68-104) 130/95 (06/08 1500) SpO2:  [90 %-100 %] 92 % (06/08 1500)  Recent Labs  Lab 10/21/22 2003 10/21/22 2147 10/22/22 0751 10/22/22 1146 10/22/22 1632  GLUCAP 190* 179* 139* 137* 124*   Recent Labs  Lab 10/21/22 1304 10/21/22 1305 10/22/22 0151  NA 139 136 137  K 4.0 4.1 3.8  CL 99 102 100  CO2  --  25 25  GLUCOSE 163* 164* 121*  BUN 3* 5* 6  CREATININE 1.00 0.99 1.09  CALCIUM  --  8.5* 8.2*   Recent Labs  Lab 10/21/22 1305 10/22/22 0151  AST 51* 43*  ALT 46* 42  ALKPHOS 75 74  BILITOT 0.9 0.9  PROT 6.6 6.9  ALBUMIN 3.3* 3.3*   Recent Labs  Lab 10/21/22 1304 10/21/22 1305 10/22/22 0151  WBC  --  8.0 7.4  NEUTROABS  --  4.6  --   HGB 14.3 13.3 13.0  HCT 42.0 42.3 40.4  MCV  --  90.4 88.0  PLT  --  138* 123*   No results for input(s): "CKTOTAL", "CKMB", "CKMBINDEX", "TROPONINI" in the last 168 hours. Recent Labs    10/21/22 1305  LABPROT 13.4  INR 1.0   Recent Labs    10/21/22 1300  COLORURINE YELLOW  LABSPEC 1.029  PHURINE 7.0  GLUCOSEU >=500*  HGBUR SMALL*  BILIRUBINUR NEGATIVE  KETONESUR NEGATIVE  PROTEINUR NEGATIVE  NITRITE NEGATIVE  LEUKOCYTESUR NEGATIVE       Component Value Date/Time   CHOL 136 10/21/2022 2121   CHOL 139 04/16/2019 0842   TRIG 184 (H) 10/21/2022 2121   HDL 31 (L) 10/21/2022 2121   HDL 36 (L) 04/16/2019 0842   CHOLHDL 4.4 10/21/2022 2121   VLDL 37 10/21/2022 2121   LDLCALC  68 10/21/2022 2121   LDLCALC 68 04/16/2019 0842   Lab Results  Component Value Date   HGBA1C 7.5 (H) 10/21/2022      Component Value Date/Time   LABOPIA NONE DETECTED 10/21/2022 1300   COCAINSCRNUR NONE DETECTED 10/21/2022 1300   LABBENZ NONE DETECTED 10/21/2022 1300   AMPHETMU NONE DETECTED 10/21/2022 1300   THCU NONE DETECTED 10/21/2022 1300   LABBARB NONE DETECTED 10/21/2022 1300    Recent Labs  Lab 10/21/22 1306  ETH <10    I have personally reviewed the radiological images below and agree with the radiology interpretations.  ECHOCARDIOGRAM COMPLETE  Result Date: 10/22/2022    ECHOCARDIOGRAM REPORT   Patient Name:   Jeff [name] Jeff [name] Date of Exam: 10/22/2022 Medical Rec #:  [id number]        Height:       70.0 in Accession #:    [id number]       Weight:       213.8 lb Date of Birth:  08-02-1965        BSA:          2.148 m Patient Age:    57 years  BP:           150/100 mmHg Patient Gender: M                HR:           97 bpm. Exam Location:  Inpatient Procedure: 2D Echo, Cardiac Doppler and Color Doppler Indications:    Stroke  History:        Patient has no prior history of Echocardiogram examinations.                 Stroke and PAD; Risk Factors:Hypertension, Sleep Apnea and                 Diabetes.  Sonographer:    Lucy Antigua Referring Phys: [id number] Lynnae January IMPRESSIONS  1. Left ventricular ejection fraction, by estimation, is 60 to 65%. The left ventricle has normal function. The left ventricle has no regional wall motion abnormalities. There is mild concentric left ventricular hypertrophy. Left ventricular diastolic parameters are consistent with Grade I diastolic dysfunction (impaired relaxation).  2. Right ventricular systolic function is normal. The right ventricular size is normal. Tricuspid regurgitation signal is inadequate for assessing PA pressure.  3. The mitral valve is grossly normal. Trivial mitral valve regurgitation. No evidence of mitral stenosis.  4.  The aortic valve is tricuspid. Aortic valve regurgitation is not visualized. No aortic stenosis is present.  5. Ascending aorta not well seen. Measurments likely inaccurate. Recent CT with normal aortic measurements.  6. The inferior vena cava is normal in size with greater than 50% respiratory variability, suggesting right atrial pressure of 3 mmHg. Conclusion(s)/Recommendation(s): No intracardiac source of embolism detected on this transthoracic study. Consider a transesophageal echocardiogram to exclude cardiac source of embolism if clinically indicated. FINDINGS  Left Ventricle: Left ventricular ejection fraction, by estimation, is 60 to 65%. The left ventricle has normal function. The left ventricle has no regional wall motion abnormalities. The left ventricular internal cavity size was normal in size. There is  mild concentric left ventricular hypertrophy. Left ventricular diastolic parameters are consistent with Grade I diastolic dysfunction (impaired relaxation). Right Ventricle: The right ventricular size is normal. No increase in right ventricular wall thickness. Right ventricular systolic function is normal. Tricuspid regurgitation signal is inadequate for assessing PA pressure. Left Atrium: Left atrial size was normal in size. Right Atrium: Right atrial size was normal in size. Pericardium: Trivial pericardial effusion is present. Mitral Valve: The mitral valve is grossly normal. Trivial mitral valve regurgitation. No evidence of mitral valve stenosis. Tricuspid Valve: The tricuspid valve is grossly normal. Tricuspid valve regurgitation is trivial. No evidence of tricuspid stenosis. Aortic Valve: The aortic valve is tricuspid. Aortic valve regurgitation is not visualized. No aortic stenosis is present. Aortic valve mean gradient measures 2.0 mmHg. Aortic valve peak gradient measures 3.5 mmHg. Aortic valve area, by VTI measures 4.01 cm. Pulmonic Valve: The pulmonic valve was grossly normal. Pulmonic  valve regurgitation is not visualized. Aorta: Ascending aorta not well seen. Measurments likely inaccurate. Recent CT with normal aortic measurements. The aortic root is normal in size and structure. Venous: The inferior vena cava is normal in size with greater than 50% respiratory variability, suggesting right atrial pressure of 3 mmHg. IAS/Shunts: The atrial septum is grossly normal.  LEFT VENTRICLE PLAX 2D LVIDd:         5.00 cm   Diastology LVIDs:         3.20 cm   LV e' medial:    6.84 cm/s  LV PW:         1.20 cm   LV E/e' medial:  7.4 LV IVS:        1.10 cm   LV e' lateral:   4.82 cm/s LVOT diam:     2.20 cm   LV E/e' lateral: 10.5 LV SV:         65 LV SV Index:   30 LVOT Area:     3.80 cm  RIGHT VENTRICLE RV S prime:     19.70 cm/s TAPSE (M-mode): 2.1 cm LEFT ATRIUM             Index        RIGHT ATRIUM           Index LA Vol (A2C):   29.1 ml 13.55 ml/m  RA Area:     13.50 cm LA Vol (A4C):   41.3 ml 19.23 ml/m  RA Volume:   31.00 ml  14.44 ml/m LA Biplane Vol: 37.6 ml 17.51 ml/m  AORTIC VALVE AV Area (Vmax):    3.60 cm AV Area (Vmean):   3.56 cm AV Area (VTI):     4.01 cm AV Vmax:           93.60 cm/s AV Vmean:          64.200 cm/s AV VTI:            0.163 m AV Peak Grad:      3.5 mmHg AV Mean Grad:      2.0 mmHg LVOT Vmax:         88.60 cm/s LVOT Vmean:        60.100 cm/s LVOT VTI:          0.172 m LVOT/AV VTI ratio: 1.06  AORTA Ao Root diam: 3.80 cm Ao Asc diam:  4.40 cm MITRAL VALVE MV Area (PHT): 2.91 cm    SHUNTS MV Decel Time: 261 msec    Systemic VTI:  0.17 m MV E velocity: 50.70 cm/s  Systemic Diam: 2.20 cm MV A velocity: 60.00 cm/s MV E/A ratio:  0.85 Lennie Odor MD Electronically signed by Lennie Odor MD Signature Date/Time: 10/22/2022/3:43:42 PM    Final    VAS [place] LOWER EXTREMITY VENOUS (DVT)  Result Date: 10/22/2022  Lower Venous DVT Study Patient Name:  BRAYCEN SUTHERLAND [facility]  Date of Exam:   10/22/2022 Medical Rec #: [id number]         Accession #:    [id number] Date of Birth: 1965-12-09          Patient Gender: M Patient Age:   59 years Exam Location:  [facility] Procedure:      VAS [place] LOWER EXTREMITY VENOUS (DVT) Referring Phys: Scheryl Marten Derion Kreiter --------------------------------------------------------------------------------  Indications: Stroke.  Comparison Study: No previous study. Performing Technologist: McKayla Maag RVT, [place]  Examination Guidelines: A complete evaluation includes B-mode imaging, spectral Doppler, color Doppler, and power Doppler as needed of all accessible portions of each vessel. Bilateral testing is considered an integral part of a complete examination. Limited examinations for reoccurring indications may be performed as noted. The reflux portion of the exam is performed with the patient in reverse Trendelenburg.  +---------+---------------+---------+-----------+----------+--------------+ RIGHT    CompressibilityPhasicitySpontaneityPropertiesThrombus Aging +---------+---------------+---------+-----------+----------+--------------+ CFV      Full           Yes      Yes                                 +---------+---------------+---------+-----------+----------+--------------+  SFJ      Full                                                        +---------+---------------+---------+-----------+----------+--------------+ FV Prox  Full                                                        +---------+---------------+---------+-----------+----------+--------------+ FV Mid   Full                                                        +---------+---------------+---------+-----------+----------+--------------+ FV DistalFull                                                        +---------+---------------+---------+-----------+----------+--------------+ PFV      Full                                                        +---------+---------------+---------+-----------+----------+--------------+ POP      Full           Yes      Yes                                  +---------+---------------+---------+-----------+----------+--------------+ PTV      Full                                                        +---------+---------------+---------+-----------+----------+--------------+ PERO     Full                                                        +---------+---------------+---------+-----------+----------+--------------+   +---------+---------------+---------+-----------+----------+--------------+ LEFT     CompressibilityPhasicitySpontaneityPropertiesThrombus Aging +---------+---------------+---------+-----------+----------+--------------+ CFV      Full           Yes      Yes                                 +---------+---------------+---------+-----------+----------+--------------+ SFJ      Full                                                        +---------+---------------+---------+-----------+----------+--------------+  FV Prox  Full                                                        +---------+---------------+---------+-----------+----------+--------------+ FV Mid   Full                                                        +---------+---------------+---------+-----------+----------+--------------+ FV DistalFull                                                        +---------+---------------+---------+-----------+----------+--------------+ PFV      Full                                                        +---------+---------------+---------+-----------+----------+--------------+ POP      Full           Yes      Yes                                 +---------+---------------+---------+-----------+----------+--------------+ PTV      Full                                                        +---------+---------------+---------+-----------+----------+--------------+ PERO     Full                                                         +---------+---------------+---------+-----------+----------+--------------+     Summary: BILATERAL: - No evidence of deep vein thrombosis seen in the lower extremities, bilaterally. - No evidence of superficial venous thrombosis in the lower extremities, bilaterally. -No evidence of popliteal cyst, bilaterally.   *See table(s) above for measurements and observations.    Preliminary    MR BRAIN WO CONTRAST  Result Date: 10/22/2022 CLINICAL DATA:  Stroke follow-up EXAM: MRI HEAD WITHOUT CONTRAST TECHNIQUE: Multiplanar, multiecho pulse sequences of the brain and surrounding structures were obtained without intravenous contrast. COMPARISON:  CT/CTA head and neck and brain MRI 1 day prior FINDINGS: This is a limited study with axial DWI and axial SWI sequences obtained. Brain: There is diffusion restriction in the right frontal lobe with involvement of the precentral gyrus and pre motor region, corona radiata, and operculum consistent with acute infarcts. The extent of the infarcts appears progressed compared to the MRI from 1 day prior. There is no hemorrhage or mass effect. Parenchymal volume is stable. The ventricles are stable in size. There is  no midline shift. IMPRESSION: Limited study with only SWI and DWI sequences obtained. Acute infarcts in the right MCA distribution appear progressed in extent compared to the study from 1 day prior. No hemorrhage or mass effect. Electronically Signed   By: Lesia Hausen M.D.   On: 10/22/2022 11:38   CT ANGIO HEAD NECK W WO CM (CODE STROKE)  Result Date: 10/21/2022 CLINICAL DATA:  Neuro deficit, acute, stroke suspected EXAM: CT ANGIOGRAPHY HEAD AND NECK WITH AND WITHOUT CONTRAST TECHNIQUE: Multidetector CT imaging of the head and neck was performed using the standard protocol during bolus administration of intravenous contrast. Multiplanar CT image reconstructions and MIPs were obtained to evaluate the vascular anatomy. Carotid stenosis measurements (when applicable) are  obtained utilizing NASCET criteria, using the distal internal carotid diameter as the denominator. RADIATION DOSE REDUCTION: This exam was performed according to the departmental dose-optimization program which includes automated exposure control, adjustment of the mA and/or kV according to patient size and/or use of iterative reconstruction technique. CONTRAST:  75mL OMNIPAQUE IOHEXOL 350 MG/ML SOLN COMPARISON:  CT Head 12/18/21 FINDINGS: CT HEAD FINDINGS See same day CT brain for intracranial findings. CTA NECK FINDINGS Aortic arch: Standard branching. Imaged portion shows no evidence of aneurysm or dissection. No significant stenosis of the major arch vessel origins. Right carotid system: No evidence of dissection, stenosis (50% or greater), or occlusion. Left carotid system: No evidence of dissection, stenosis (50% or greater), or occlusion. Vertebral arteries: Codominant. No evidence of dissection, stenosis (50% or greater), or occlusion. Skeleton: Status post C3-C4 ACDF with solid osseous fusion. There is also posterior fusion of the level. Patient is edentulous. Other neck: Negative. Upper chest: Redemonstrated right hilar lymphadenopathy, better assessed on prior CT chest abdomen and pelvis. No pleural effusion. No pneumothorax. Right chest port in place. Mild circumferential esophageal thickening Review of the MIP images confirms the above findings CTA HEAD FINDINGS Anterior circulation: No significant stenosis, proximal occlusion, aneurysm, or vascular malformation. Posterior circulation: No significant stenosis, proximal occlusion, aneurysm, or vascular malformation. Venous sinuses: As permitted by contrast timing, patent. Anatomic variants: Fetal PCAs bilaterally. Review of the MIP images confirms the above findings IMPRESSION: 1. No intracranial large vessel occlusion or significant stenosis. 2. No hemodynamically significant stenosis in the neck. 3. Redemonstrated right hilar lymphadenopathy, better  assessed on prior CT chest abdomen and pelvis. 4. Mild circumferential esophageal thickening, which can be seen in the setting of esophagitis. Electronically Signed   By: Lorenza Cambridge M.D.   On: 10/21/2022 14:25   MR BRAIN W WO CONTRAST  Result Date: 10/21/2022 CLINICAL DATA:  Transient ischemic attack (TIA) Stroke, follow up Neuro deficit, acute, stroke suspected Metastatic disease evaluation. EXAM: MRI HEAD WITHOUT AND WITH CONTRAST TECHNIQUE: Multiplanar, multiecho pulse sequences of the brain and surrounding structures were obtained without and with intravenous contrast. CONTRAST:  9mL GADAVIST GADOBUTROL 1 MMOL/ML IV SOLN COMPARISON:  Head CT 10/21/2022.  MRI brain 04/09/2020. FINDINGS: Brain: Restricted diffusion in the right precentral gyrus and pre motor area, consistent with acute infarct. No acute hemorrhage or significant mass effect. Underlying mild chronic small-vessel disease. No hydrocephalus. Small bilateral middle cranial fossa arachnoid cysts, right-greater-than-left. No mass or abnormal enhancement. No foci of abnormal susceptibility. Vascular: Normal flow voids and vessel enhancement. Skull and upper cervical spine: Partially visualized susceptibility artifact from posterior spinal fusion hardware. Otherwise, normal marrow signal and enhancement. Sinuses/Orbits: Unremarkable. Other: None. IMPRESSION: 1. Acute infarcts in the right precentral gyrus and pre motor area. 2. No evidence of intracranial  metastatic disease. Electronically Signed   By: Orvan Falconer M.D.   On: 10/21/2022 14:14   CT HEAD CODE STROKE WO CONTRAST  Result Date: 10/21/2022 CLINICAL DATA:  Code stroke. Neuro deficit, acute, stroke suspected left-sided weakness EXAM: CT HEAD WITHOUT CONTRAST TECHNIQUE: Contiguous axial images were obtained from the base of the skull through the vertex without intravenous contrast. RADIATION DOSE REDUCTION: This exam was performed according to the departmental dose-optimization program  which includes automated exposure control, adjustment of the mA and/or kV according to patient size and/or use of iterative reconstruction technique. COMPARISON:  CT Head 12/18/21 FINDINGS: Brain: No evidence of acute infarction, hemorrhage, hydrocephalus, extra-axial collection or mass lesion/mass effect. Enlarged and partially empty sella. Vascular: No hyperdense vessel or unexpected calcification. Skull: Normal. Negative for fracture or focal lesion. Sinuses/Orbits: No middle ear or mastoid effusion. Paranasal sinuses clear. Orbits are unremarkable. Other: None. ASPECTS [name] Stroke Program Early CT Score): 10 IMPRESSION: No hemorrhage or CT evidence of an acute cortical infarct. Aspects 10. Findings were paged to Dr. Derry Lory on 10/21/22 at 1:11 PM via [name] paging system. Electronically Signed   By: Lorenza Cambridge M.D.   On: 10/21/2022 13:14     PHYSICAL EXAM  Temp:  [97.4 F (36.3 C)-98 F (36.7 C)] 97.7 F (36.5 C) (06/08 1200) Pulse Rate:  [72-142] 80 (06/08 1500) Resp:  [10-24] 16 (06/08 1500) BP: (108-169)/(68-104) 130/95 (06/08 1500) SpO2:  [90 %-100 %] 92 % (06/08 1500)  General - Well nourished, well developed, in no apparent distress.  Ophthalmologic - fundi not visualized due to noncooperation.  Cardiovascular - Regular rhythm and rate.  Neuro - awake, alert, eyes open, orientated to age, place, year and people, he tried twice to get month right. No aphasia, fluent language but paucity of speech, following all simple commands. Able to name and repeat. No gaze palsy, tracking bilaterally, visual field full. Left facial droop. Tongue midline. RUE 5/5, LUE drift to bed beyond 10 sec. Bilaterally LEs 5/5, no drift. Sensation symmetrical bilaterally, b/l FTN intact but slow on the left, gait not tested.    ASSESSMENT/PLAN Jeff [name] is a 57 y.o. male with history of hypertension, diabetes, recurrent seizure on Keppra, PAD status post bypass, Graves' disease status post  radiation, colon cancer on chemotherapy with liver and lung metastasis, thrombocytopenia admitted for left-sided weakness, left facial droop.  TNK given.    Stroke:  right MCA cortical small infarcts, embolic pattern, etiology unclear, could be from cardioembolic source versus hypercoag state from advanced cancer CT no acute abnormality CT head and neck unremarkable MRI right MCA scattered cortical infarcts, no evidence of intracranial metastasis disease 2D Echo EF 60 to 65% LE venous Doppler no DVT LDL 68 HgbA1c 7.5 UDS negative Lovenox for VTE prophylaxis aspirin 81 mg daily prior to admission, now on aspirin 81 mg daily and clopidogrel 75 mg daily. Patient counseled to be compliant with his antithrombotic medications Ongoing aggressive stroke risk factor management Therapy recommendations: Outpatient PT OT Disposition: Pending  Colon cancer with metastasis Diagnosed colon cancer in 2017 Status post multiple rounds of chemotherapy with long and recurrent liver metastasis Follow-up with Dr. Truett Perna at [facility] CT pending Will consider anticoagulation if felt to be in hypercoagulable state from advanced cancer  Heart palpitation Per patient and wife, patient does have infrequent heart palpitation Had palpitation episodes due to Graves' disease in the past, after treatment if has been improved Not sure if A-fib episode, will need prolonged cardiac monitoring  Diabetes HgbA1c 7.5 goal < 7.0 Uncontrolled CBG monitoring SSI DM education and close PCP follow up  Hypertension Stable BP goal <180/105 Long term BP goal normotensive  Hyperlipidemia Home meds: Lipitor 80 LDL 68, goal < 70 Now on Lipitor 80 Continue statin at discharge  Other Stroke Risk Factors Obesity, Body mass index is 30.68 kg/m.  PAD status post bypass  Other Active Problems Graves' disease status post radiation, not on levothyroxine Recurrent seizure, on Keppra 700 mg twice daily - follows with Dr.  Karel Jarvis at [facility] Elevated LFT, AST/ALT 51/46->3/42  Hospital day # 1  This patient is critically ill due to acute stroke status post TNK, uncontrolled diabetes, colon cancer with metastasis, history of seizure and at significant risk of neurological worsening, death form recurrent stroke, hemorrhagic transmission, status epilepticus. This patient's care requires constant monitoring of vital signs, hemodynamics, respiratory and cardiac monitoring, review of multiple databases, neurological assessment, discussion with family, other specialists and medical decision making of high complexity. I spent 40 minutes of neurocritical care time in the care of this patient. I had long discussion with wife and patient at bedside, updated pt current condition, treatment plan and potential prognosis, and answered all the questions.  They expressed understanding and appreciation.   Marvel Plan, MD PhD Stroke Neurology 10/22/2022 6:04 PM    To contact Stroke Continuity provider, please refer to [email or web address]. After hours, contact General Neurology

## 2022-10-22 NOTE — Evaluation (Signed)
Physical Therapy Evaluation Patient Details Name: Jeff [name] MRN: [id number] DOB: January 12, 1966 Today's Date: 10/22/2022  History of Present Illness  57 yo male presenting via EMS 6/7 with L hemiparesis and L facial droop. 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 TNK given 6/7 at 1332. PMH includes: seizures, NSTEMI, metastatic colon CA, DM II, PAD, and hypothroidism.   Clinical Impression  Pt in bed upon arrival of PT, agreeable to evaluation at this time. Prior to admission the pt was completely independent with all mobility and ADLs. The pt now presents with minor limitations in functional mobility, endurance, and dynamic stability due to above dx, and will continue to benefit from skilled PT to address these deficits. Will continue to benefit from skilled PT acutely to further challenge high-level balance, but anticipate pt will be safe to return home with family support when medically cleared for d/c. Greatest impairment at this time is LUE function, pt given exercises to work on outside of PT session. Pt and family in agreement with plan.       Recommendations for follow up therapy are one component of a multi-disciplinary discharge planning process, led by the attending physician.  Recommendations may be updated based on patient status, additional functional criteria and insurance authorization.  Follow Up Recommendations       Assistance Recommended at Discharge Frequent or constant Supervision/Assistance  Patient can return home with the following  A little help with walking and/or transfers;Help with stairs or ramp for entrance    Equipment Recommendations None recommended by PT  Recommendations for Other Services       Functional Status Assessment Patient has had a recent decline in their functional status and demonstrates the ability to make significant improvements in function in a reasonable and predictable amount of time.     Precautions /  Restrictions Precautions Precautions: None Restrictions Weight Bearing Restrictions: No      Mobility  Bed Mobility Overal bed mobility: Needs Assistance Bed Mobility: Rolling, Supine to Sit Rolling: Min assist   Supine to sit: Min assist     General bed mobility comments: pt able to reach EOB with minA and then minA for initial seated balance.    Transfers Overall transfer level: Needs assistance Equipment used: None Transfers: Sit to/from Stand Sit to Stand: Min assist           General transfer comment: pt able to power up and sustain static standing without assist    Ambulation/Gait Ambulation/Gait assistance: Min guard Gait Distance (Feet): 200 Feet Assistive device: None Gait Pattern/deviations: WFL(Within Functional Limits) Gait velocity: WFL Gait velocity interpretation: 1.31 - 2.62 ft/sec, indicative of limited community ambulator   General Gait Details: generally funcitonal, even with balance challenge, head turns, and quick changes in direction  Stairs Stairs: Yes Stairs assistance: Min guard Stair Management: One rail Right, Alternating pattern, Forwards Number of Stairs: 12 General stair comments: VSS, use of rail in RUE for safety due to impaired grip in LUE  Wheelchair Mobility    Modified Rankin (Stroke Patients Only) Modified Rankin (Stroke Patients Only) Pre-Morbid Rankin Score: No symptoms Modified Rankin: Moderately severe disability     Balance Overall balance assessment: Needs assistance, No apparent balance deficits (not formally assessed)         Standing balance support: No upper extremity supported Standing balance-Leahy Scale: Good       Tandem Stance - Right Leg: 6 Tandem Stance - Left Leg: 10 Rhomberg - Eyes  Opened: 15 Rhomberg - Eyes Closed: 10 (increased sway) High level balance activites: Direction changes, Turns, Sudden stops, Head turns High Level Balance Comments: no support or LOB Standardized Balance  Assessment Standardized Balance Assessment : Dynamic Gait Index   Dynamic Gait Index Level Surface: Normal Change in Gait Speed: Normal Gait with Horizontal Head Turns: Normal Gait with Vertical Head Turns: Normal Gait and Pivot Turn: Normal Step Over Obstacle: Mild Impairment Step Around Obstacles: Normal Steps: Mild Impairment Total Score: 22       Pertinent Vitals/Pain Pain Assessment Pain Assessment: Faces Faces Pain Scale: Hurts a little bit Pain Location: back and neck (chronic) Pain Descriptors / Indicators: Discomfort Pain Intervention(s): Limited activity within patient's tolerance, Monitored during session    Home Living Family/patient expects to be discharged to:: Private residence Living Arrangements: Spouse/significant other Available Help at Discharge: Family;Available PRN/intermittently Type of Home: House Home Access: Stairs to enter Entrance Stairs-Rails: None Entrance Stairs-Number of Steps: 2   Home Layout: One level Home Equipment: Cane - single point      Prior Function Prior Level of Function : Independent/Modified Independent;Driving;History of Falls (last six months)             Mobility Comments: independent without DME ADLs Comments: independent     Hand Dominance   Dominant Hand: Right    Extremity/Trunk Assessment   Upper Extremity Assessment Upper Extremity Assessment: Defer to OT evaluation LUE Deficits / Details: pt demonstrates full active ROM of scapula, shoulder flexion/ extension, elbow flexion and extension, wrist extension. Pt demonstrates decreased digit extension and flexion. pt with ~5-10 second delay to initiate the task. pt once initiated with loose grasp. pt with sensation changes with increased numbness toward DIP of digits LUE Sensation: decreased light touch LUE Coordination: decreased fine motor;decreased gross motor    Lower Extremity Assessment Lower Extremity Assessment: Overall WFL for tasks assessed  (grossly 5/5 to MMT. no focal weakness or change in sensation. Pt with good muscle bulk bilaterally.)    Cervical / Trunk Assessment Cervical / Trunk Assessment: Other exceptions Cervical / Trunk Exceptions: hx of back pain and multiple cervical surgeries  Communication   Communication: No difficulties  Cognition Arousal/Alertness: Awake/alert Behavior During Therapy: WFL for tasks assessed/performed Overall Cognitive Status: Within Functional Limits for tasks assessed                                          General Comments General comments (skin integrity, edema, etc.): VSS on RA    Exercises Other Exercises Other Exercises: Educated on PNF D1 pattern and provided a squeeze ball for digit flexion and extension. pt educated on not using the ball to complete the task but clearly opening to grasp ball and then releasing on command. Other Exercises: Access Code: [email or web address]   Assessment/Plan    PT Assessment Patient needs continued PT services  PT Problem List Decreased strength;Decreased activity tolerance;Decreased balance;Decreased mobility;Impaired sensation       PT Treatment Interventions Gait training;Stair training;Therapeutic activities;Therapeutic exercise;Balance training;Neuromuscular re-education    PT Goals (Current goals can be found in the Care Plan section)  Acute Rehab PT Goals Patient Stated Goal: reutrn home PT Goal Formulation: With patient Time For Goal Achievement: 11/05/22 Potential to Achieve Goals: Good    Frequency Min 4X/week        AM-PAC PT "6 Clicks" Mobility  Outcome Measure Help needed turning from  your back to your side while in a flat bed without using bedrails?: None Help needed moving from lying on your back to sitting on the side of a flat bed without using bedrails?: None Help needed moving to and from a bed to a chair (including a wheelchair)?: None Help needed standing up from a chair using your arms (e.g.,  wheelchair or bedside chair)?: None Help needed to walk in hospital room?: A Little Help needed climbing 3-5 steps with a railing? : A Little 6 Click Score: 22    End of Session Equipment Utilized During Treatment: Gait belt Activity Tolerance: Patient tolerated treatment well Patient left: in chair;with call bell/phone within reach;with family/visitor present Nurse Communication: Mobility status PT Visit Diagnosis: Unsteadiness on feet (R26.81);Other abnormalities of gait and mobility (R26.89)    Time: 6045-4098 PT Time Calculation (min) (ACUTE ONLY): 34 min   Charges:   PT Evaluation $PT Eval Low Complexity: 1 Low          Vickki Muff, PT, DPT   Acute Rehabilitation Department Office [phone] Secure Chat Communication Preferred  Ronnie Derby 10/22/2022, 2:34 PM

## 2022-10-22 NOTE — Progress Notes (Signed)
  Echocardiogram 2D Echocardiogram has been performed.  Jeff [name] 10/22/2022, 2:43 PM

## 2022-10-22 NOTE — Progress Notes (Signed)
IP PROGRESS NOTE  Subjective:   Jeff [name] is well-known to me with a history of metastatic colon cancer.  He is currently being treated with fruquintinib.  He began cycle 6 on 10/11/2022.  He is scheduled for restaging CTs prior to an office visit in 2 weeks. He reports the acute onset of dysarthria and left hand weakness while eating lunch yesterday.  He presented to the most current emergency room.  He was noted to have a left facial droop, left hand numbness, decreased grip strength, and slight dysarthria. A brain MRI revealed acute infarcts in the right precentral gyrus and premotor area.  No evidence of brain metastases.  He was treated with thrombolytic therapy. He reports improvement in dysarthria.  There is persistent left hand numbness and weakness.  He reports feeling well prior to the onset of neurologic symptoms yesterday.  He missed his blood pressure medicine for approximately 4 days prior to hospital admission.  Objective: Vital signs in last 24 hours: Blood pressure (!) 169/99, pulse 84, temperature 97.9 F (36.6 C), temperature source Oral, resp. rate 20, height 5\' 10"  (1.778 m), weight 213 lb 13.5 oz (97 kg), SpO2 96 %.  Intake/Output from previous day: 06/07 0701 - 06/08 0700 In: -  Out: 400 [Urine:400]  Physical Exam:  HEENT: No thrush or ulcers Lungs: Clear bilaterally Cardiac: Regular rate and rhythm Abdomen: No hepatosplenomegaly Extremities: No leg edema Neurologic: Alert and oriented, speech is fluent, the face is symmetric, the motor exam appears intact in the upper and lower extremities bilaterally except for decreased grip strength at the left hand and dorsiflexion at the left wrist  Portacath/PICC-without erythema  Lab Results: Recent Labs    10/21/22 1305 10/22/22 0151  WBC 8.0 7.4  HGB 13.3 13.0  HCT 42.3 40.4  PLT 138* 123*    BMET Recent Labs    10/21/22 1305 10/22/22 0151  NA 136 137  K 4.1 3.8  CL 102 100  CO2 25 25  GLUCOSE 164*  121*  BUN 5* 6  CREATININE 0.99 1.09  CALCIUM 8.5* 8.2*    Lab Results  Component Value Date   CEA1 3.19 09/10/2020   CEA 203.54 (H) 10/11/2022    Studies/Results: CT ANGIO HEAD NECK W WO CM (CODE STROKE)  Result Date: 10/21/2022 CLINICAL DATA:  Neuro deficit, acute, stroke suspected EXAM: CT ANGIOGRAPHY HEAD AND NECK WITH AND WITHOUT CONTRAST TECHNIQUE: Multidetector CT imaging of the head and neck was performed using the standard protocol during bolus administration of intravenous contrast. Multiplanar CT image reconstructions and MIPs were obtained to evaluate the vascular anatomy. Carotid stenosis measurements (when applicable) are obtained utilizing NASCET criteria, using the distal internal carotid diameter as the denominator. RADIATION DOSE REDUCTION: This exam was performed according to the departmental dose-optimization program which includes automated exposure control, adjustment of the mA and/or kV according to patient size and/or use of iterative reconstruction technique. CONTRAST:  75mL OMNIPAQUE IOHEXOL 350 MG/ML SOLN COMPARISON:  CT Head 12/18/21 FINDINGS: CT HEAD FINDINGS See same day CT brain for intracranial findings. CTA NECK FINDINGS Aortic arch: Standard branching. Imaged portion shows no evidence of aneurysm or dissection. No significant stenosis of the major arch vessel origins. Right carotid system: No evidence of dissection, stenosis (50% or greater), or occlusion. Left carotid system: No evidence of dissection, stenosis (50% or greater), or occlusion. Vertebral arteries: Codominant. No evidence of dissection, stenosis (50% or greater), or occlusion. Skeleton: Status post C3-C4 ACDF with solid osseous fusion. There is also  posterior fusion of the level. Patient is edentulous. Other neck: Negative. Upper chest: Redemonstrated right hilar lymphadenopathy, better assessed on prior CT chest abdomen and pelvis. No pleural effusion. No pneumothorax. Right chest port in place. Mild  circumferential esophageal thickening Review of the MIP images confirms the above findings CTA HEAD FINDINGS Anterior circulation: No significant stenosis, proximal occlusion, aneurysm, or vascular malformation. Posterior circulation: No significant stenosis, proximal occlusion, aneurysm, or vascular malformation. Venous sinuses: As permitted by contrast timing, patent. Anatomic variants: Fetal PCAs bilaterally. Review of the MIP images confirms the above findings IMPRESSION: 1. No intracranial large vessel occlusion or significant stenosis. 2. No hemodynamically significant stenosis in the neck. 3. Redemonstrated right hilar lymphadenopathy, better assessed on prior CT chest abdomen and pelvis. 4. Mild circumferential esophageal thickening, which can be seen in the setting of esophagitis. Electronically Signed   By: Lorenza Cambridge M.D.   On: 10/21/2022 14:25   MR BRAIN W WO CONTRAST  Result Date: 10/21/2022 CLINICAL DATA:  Transient ischemic attack (TIA) Stroke, follow up Neuro deficit, acute, stroke suspected Metastatic disease evaluation. EXAM: MRI HEAD WITHOUT AND WITH CONTRAST TECHNIQUE: Multiplanar, multiecho pulse sequences of the brain and surrounding structures were obtained without and with intravenous contrast. CONTRAST:  9mL GADAVIST GADOBUTROL 1 MMOL/ML IV SOLN COMPARISON:  Head CT 10/21/2022.  MRI brain 04/09/2020. FINDINGS: Brain: Restricted diffusion in the right precentral gyrus and pre motor area, consistent with acute infarct. No acute hemorrhage or significant mass effect. Underlying mild chronic small-vessel disease. No hydrocephalus. Small bilateral middle cranial fossa arachnoid cysts, right-greater-than-left. No mass or abnormal enhancement. No foci of abnormal susceptibility. Vascular: Normal flow voids and vessel enhancement. Skull and upper cervical spine: Partially visualized susceptibility artifact from posterior spinal fusion hardware. Otherwise, normal marrow signal and enhancement.  Sinuses/Orbits: Unremarkable. Other: None. IMPRESSION: 1. Acute infarcts in the right precentral gyrus and pre motor area. 2. No evidence of intracranial metastatic disease. Electronically Signed   By: Orvan Falconer M.D.   On: 10/21/2022 14:14   CT HEAD CODE STROKE WO CONTRAST  Result Date: 10/21/2022 CLINICAL DATA:  Code stroke. Neuro deficit, acute, stroke suspected left-sided weakness EXAM: CT HEAD WITHOUT CONTRAST TECHNIQUE: Contiguous axial images were obtained from the base of the skull through the vertex without intravenous contrast. RADIATION DOSE REDUCTION: This exam was performed according to the departmental dose-optimization program which includes automated exposure control, adjustment of the mA and/or kV according to patient size and/or use of iterative reconstruction technique. COMPARISON:  CT Head 12/18/21 FINDINGS: Brain: No evidence of acute infarction, hemorrhage, hydrocephalus, extra-axial collection or mass lesion/mass effect. Enlarged and partially empty sella. Vascular: No hyperdense vessel or unexpected calcification. Skull: Normal. Negative for fracture or focal lesion. Sinuses/Orbits: No middle ear or mastoid effusion. Paranasal sinuses clear. Orbits are unremarkable. Other: None. ASPECTS [facility] Stroke Program Early CT Score): 10 IMPRESSION: No hemorrhage or CT evidence of an acute cortical infarct. Aspects 10. Findings were paged to Dr. Derry Lory on 10/21/22 at 1:11 PM via [facility] paging system. Electronically Signed   By: Lorenza Cambridge M.D.   On: 10/21/2022 13:14    Medications: I have reviewed the patient's current medications.  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, biopsy confirmed adenocarcinoma         CTs chest, abdomen, and pelvis on 12/11/2015-indeterminate tiny pulmonary nodules, multiple liver metastases, small nodes in the sigmoid mesocolon Laparoscopic sigmoid colectomy, liver biopsy, Port-A-Cath  placement 01/14/2016 Pathology sigmoid colon resection- colonic  adenocarcinoma, 5.3 cm extending into pericolonic connective tissue, positive lymph-vascular invasion, positive perineural invasion, negative margins, metastatic carcinoma in 9 of 28 lymph nodes Pathology liver biopsy-metastatic colorectal adenocarcinoma MSI stable; mismatch repair protein normal APC alteration, K RAS wild-type, no BRAF mutation Cycle 1 FOLFOX 02/02/2016 Cycle 2 FOLFOX 02/15/2016 Cycle 3 FOLFOX 02/29/2016 Cycle 4 FOLFOX 03/14/2016 Cycle 5 FOLFOX 03/28/2016 Cycle 6 FOLFOX 04/11/2016 (oxaliplatin held secondary to thrombocytopenia) 04/21/2016 restaging CTs-difficulty evaluating liver lesions due to hepatic steatosis. Stable right adrenal nodule. No adenopathy or local recurrence near the rectosigmoid anastomotic site. Cycle 7 FOLFOX 04/25/2016 MRI liver 05/02/2016-partial improvement in hepatic metastases Cycle 8 FOLFOX 05/10/2016 Cycle 9 FOLFOX 05/23/2016 (oxaliplatin held due to thrombocytopenia) Cycle 10 FOLFOX 06/06/2016 (oxaliplatin dose reduced due to thrombocytopenia) Cycle 11 FOLFOX 06/27/2016 (oxaliplatin held due to neuropathy) Cycle 12 FOLFOX 07/11/2016 (oxaliplatin held) Initiation of maintenance Xeloda 7 days on/7 days off 07/27/2016 MRI liver 11/18/2016-decrease in hepatic metastatic disease. No new or progressive disease identified within the abdomen. Continuation of Xeloda 7 days on/7 days off MRI liver 04/27/2017-previous liver lesions not identified, no new lesions, no lymphadenopathy Xeloda continued 7 days on/7 days off MRI liver 12/04/2017 - no evidence of metastatic disease, hepatic steatosis Xeloda continued 7 days on/7 days off MRI liver 07/15/2018- no evidence of metastatic disease.  Stable severe hepatic steatosis. Xeloda continued 7 days on/7 days off MRI liver 03/16/2019-hepatic steatosis, no liver mass, focal area of intrahepatic biliary dilatation in segments 2 and 3 of the left  lobe-increased Xeloda continued 7 days on/7 days off MRI abdomen 08/19/2019-no findings to suggest liver metastases.  Bilateral lung nodules measuring up to 2.8 cm, progressive and more conspicuous than on previous exam CT chest 08/29/2019-multiple pulmonary metastases, new from 04/21/2016 Cycle 1 FOLFIRI/bevacizumab 09/09/2019 Cycle 2 FOLFIRI/bevacizumab 09/26/2019  Cycle 3 FOLFIRI/bevacizumab 10/10/2019 Cycle 4 FOLFIRI/bevacizumab 10/24/2019 Cycle 5 FOLFIRI/bevacizumab 11/07/2019 CT chest 11/14/2019-decreased size of lung nodules, no new lesions, hepatic steatosis Cycle 6 FOLFIRI/bevacizumab 11/21/2019 Cycle 7 FOLFIRI/bevacizumab 12/05/2019 Cycle 8 FOLFIRI/bevacizumab 12/19/2019 Cycle 9 FOLFIRI/bevacizumab 01/02/2020 Cycle 10 FOLFIRI/bevacizumab 01/16/2020 CT chest 01/29/2020-stable bilateral pulmonary metastases.  No new or progressive metastatic disease in the chest. Cycle 11 FOLFIRI/bevacizumab 01/30/2020 Cycle 12 FOLFIRI/bevacizumab 02/19/2020 Cycle 13 FOLFIRI/bevacizumab 03/12/2020 Cycle 14 FOLFIRI/bevacizumab 04/02/2020 Cycle 15 FOLFIRI/bevacizumab 04/23/2020 Cycle 16 FOLFIRI/bevacizumab 05/21/2020 CT chest 06/09/2020-mild progression pulmonary metastasis.  Some lesions have increased in size while others are similar. Cycle 1 irinotecan/Panitumumab 06/18/2020 Cycle 2 irinotecan/Panitumumab 07/02/2020 Cycle 3 irinotecan/panitumumab 07/16/2020 Cycle 4 irinotecan/Panitumumab 07/30/2020 Cycle 5 irinotecan/Panitumumab 08/13/2020, Emend added Cycle 6 irinotecan/Panitumumab 08/27/2020 CT chest 09/08/2020-decreased size of pulmonary nodules, no progressive disease Cycle 7 irinotecan/panitumumab 09/02/2020 Cycle 8 irinotecan/panitumumab 09/24/2020 Cycle 9 irinotecan/Panitumumab 10/08/2020 10/22/2020 treatment held due to left foot fracture, need for surgery Cycle 10 irinotecan/panitumumab 11/24/2020 Cycle 11 irinotecan/Panitumumab 12/08/2020 Cycle 12 irinotecan/panitumumab 12/22/2020 Cycle 13 irinotecan/panitumumab  01/05/2021 01/20/2021 CT chest-mixed response with minimal increase in size of some lesions and minimal decrease in the size of other lesions.  Overall number of lesions is unchanged. Cycle 14 irinotecan/Panitumumab 02/05/2021 Cycle 15 irinotecan/Panitumumab 02/23/2021 Cycle 16 irinotecan/panitumumab 03/16/2021 Cycle 17 irinotecan/Panitumumab 04/13/2021 05/03/2021-CT chest-enlargement of pulmonary metastases, no new lesions 06/21/2021-cycle 1 Lonsurf/bevacizumab 07/19/2021-cycle 2 Lonsurf/bevacizumab 08/16/2021-cycle 3 Lonsurf/bevacizumab 09/09/2021 CT chest-bilateral lung nodules and masses with mixed response, overall stable to very minimally increased 09/13/2021 cycle 4 Lonsurf/bevacizumab 10/12/2021 cycle 5 Lonsurf/bevacizumab 10/21/2021 chest CT-bilateral pulmonary metastases without significant change.  No new or progressive disease within the chest. 11/08/2021 cycle 6 Lonsurf/bevacizumab 12/06/2021 cycle 7 Lonsurf/bevacizumab 01/03/2022 cycle 8 Lonsurf/bevacizumab CTs  01/25/2022-index lung lesion stable to mildly increased in size.  Mild retroperitoneal adenopathy, mildly increased in size compared to 12/16/2020. 02/01/2022 cycle 9 Lonsurf/bevacizumab 02/28/2022 cycle 10 Lonsurf/bevacizumab 03/28/2022 cycle 11 Lonsurf/bevacizumab CTs 04/21/2022-no change in bilateral pulmonary metastases, 2 new hypoattenuating liver lesions, new enlarged portacaval node Cycle 1 fruquintinib 05/24/2022 Cycle 2 fruquintinib 06/21/2022 Cycle 3 fruquintinib 07/19/2022 CTs 08/12/2022-stable lung nodules, hilar and retroperitoneal nodes, slight increase in size of liver metastases, stable right adrenal nodule Cycle 4 fruquintinib 08/16/2022 Cycle 5 fruquintinib 09/13/2022 Cycle 6 fruquintinib 10/11/2022   2.   Rectal bleeding and constipation secondary to #1   3.   History of peripheral vascular disease, status post left lower extremity vascular bypass surgery in April 2017   4.   History of nephrolithiasis   5.   History of  Graves' disease treated with radioactive iodine   6.   Anxiety/depression   7.   Hypertension   8.   Hospitalization 01/19/2016 with wound dehiscence status post secondary suture closure of abdominal wall   9.   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 Hyperglycemia 06/20/2016-diagnosed with diabetes, maintained on insulin   11.  Positive COVID test 12/13/2018; positive COVID test 01/25/2021   12. Hospitalized with seizure activity/DKA. Now on Keppra, insulin adjusted. 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  Left foot fracture-surgical repair 11/11/2020 14.  Admission 10/21/2022 with an acute CVA 10/21/2022-MRI brain-acute infarct in the right precentral gyrus and premotor area, negative for brain metastases CT angiogram head/neck 10/21/2022-no large vessel occlusion or stenosis TNK 10/21/2022  Jeff [name] has metastatic colon cancer.  His diagnosis of metastatic colon cancer dates to 2017.  Is been treated with multiple systemic therapy regimens and is currently on salvage therapy with fruquintinib.  He has multiple risk factors for stroke including diabetes, history of peripheral vascular disease, and hypertension.  Fruquintinib is a VEGFR inhibitor.  This class of drugs has an associated increased risk of thromboembolic disease.  Mr. Weishaupt is scheduled for restaging CTs in 2 weeks.  We will need to consider continuing fruquintinib if there is no evidence of disease progression.  Systemic treatment options are limited in his case.  Recommendations: Hold fruquintinib today Anticoagulation versus antiplatelet therapy per neurology Hypertension management per neurology Outpatient [facility] is [facility]       LOS: 1 day   Thornton Papas, MD   10/22/2022, 9:44 AM

## 2022-10-22 NOTE — Progress Notes (Signed)
Bilateral lower extremity venous study completed.   Preliminary results relayed to RN.  Please see CV Procedures for preliminary results.  Sven Pinheiro, RVT  11:13 AM 10/22/22

## 2022-10-23 DIAGNOSIS — C189 Malignant neoplasm of colon, unspecified: Secondary | ICD-10-CM | POA: Diagnosis not present

## 2022-10-23 DIAGNOSIS — I639 Cerebral infarction, unspecified: Secondary | ICD-10-CM | POA: Diagnosis not present

## 2022-10-23 LAB — BASIC METABOLIC PANEL
Anion gap: 12 (ref 5–15)
BUN: 7 mg/dL (ref 6–20)
CO2: 24 mmol/L (ref 22–32)
Calcium: 8.8 mg/dL — ABNORMAL LOW (ref 8.9–10.3)
Chloride: 100 mmol/L (ref 98–111)
Creatinine, Ser: 1 mg/dL (ref 0.61–1.24)
GFR, Estimated: >60 mL/min (ref >60)
Glucose, Bld: 111 mg/dL — ABNORMAL HIGH (ref 70–99)
Potassium: 3.7 mmol/L (ref 3.5–5.1)
Sodium: 136 mmol/L (ref 135–145)

## 2022-10-23 LAB — CBC
HCT: 39.6 % (ref 39.0–52.0)
Hemoglobin: 12.3 g/dL — ABNORMAL LOW (ref 13.0–17.0)
MCH: 27.8 pg (ref 26.0–34.0)
MCHC: 31.1 g/dL (ref 30.0–36.0)
MCV: 89.4 fL (ref 80.0–100.0)
Platelets: 114 10*3/uL — ABNORMAL LOW (ref 150–400)
RBC: 4.43 MIL/uL (ref 4.22–5.81)
RDW: 15.1 % (ref 11.5–15.5)
WBC: 6.5 10*3/uL (ref 4.0–10.5)
nRBC: 0 % (ref 0.0–0.2)

## 2022-10-23 LAB — GLUCOSE, CAPILLARY
Glucose-Capillary: 180 mg/dL — ABNORMAL HIGH (ref 70–99)
Glucose-Capillary: 152 mg/dL — ABNORMAL HIGH (ref 70–99)
Glucose-Capillary: 119 mg/dL — ABNORMAL HIGH (ref 70–99)
Glucose-Capillary: 160 mg/dL — ABNORMAL HIGH (ref 70–99)

## 2022-10-23 LAB — TROPONIN I (HIGH SENSITIVITY): Troponin I (High Sensitivity): 6 ng/L (ref <18)

## 2022-10-23 MED ORDER — CALCIUM CARBONATE ANTACID 500 MG PO CHEW
1.0000 | CHEWABLE_TABLET | Freq: Three times a day (TID) | ORAL | Status: DC | PRN
  Administered 2022-10-23: 200 mg via ORAL
  Filled 2022-10-23: qty 1

## 2022-10-23 MED ORDER — ONDANSETRON HCL 4 MG/2ML IJ SOLN
4.0000 mg | Freq: Three times a day (TID) | INTRAMUSCULAR | Status: DC | PRN
  Administered 2022-10-23: 4 mg via INTRAVENOUS
  Filled 2022-10-23: qty 2

## 2022-10-23 MED ORDER — LABETALOL HCL 5 MG/ML IV SOLN
10.0000 mg | INTRAVENOUS | Status: DC | PRN
  Administered 2022-10-23: 20 mg via INTRAVENOUS
  Filled 2022-10-23: qty 4

## 2022-10-23 MED ORDER — METOPROLOL TARTRATE 50 MG PO TABS
100.0000 mg | ORAL_TABLET | Freq: Two times a day (BID) | ORAL | Status: DC
  Administered 2022-10-23 – 2022-10-24 (×3): 100 mg via ORAL
  Filled 2022-10-23 (×3): qty 2

## 2022-10-23 MED ORDER — APIXABAN 5 MG PO TABS
5.0000 mg | ORAL_TABLET | Freq: Two times a day (BID) | ORAL | Status: DC
  Administered 2022-10-23 – 2022-10-25 (×4): 5 mg via ORAL
  Filled 2022-10-23 (×4): qty 1

## 2022-10-23 MED ORDER — DAPAGLIFLOZIN PROPANEDIOL 10 MG PO TABS
10.0000 mg | ORAL_TABLET | Freq: Every day | ORAL | Status: DC
  Administered 2022-10-23 – 2022-10-25 (×3): 10 mg via ORAL
  Filled 2022-10-23 (×4): qty 1

## 2022-10-23 MED ORDER — AMLODIPINE BESYLATE 10 MG PO TABS
10.0000 mg | ORAL_TABLET | Freq: Every day | ORAL | Status: DC
  Administered 2022-10-23 – 2022-10-24 (×2): 10 mg via ORAL
  Filled 2022-10-23 (×2): qty 1

## 2022-10-23 MED ORDER — ONDANSETRON HCL 4 MG/2ML IJ SOLN
4.0000 mg | INTRAMUSCULAR | Status: DC | PRN
  Administered 2022-10-23: 4 mg via INTRAVENOUS
  Filled 2022-10-23: qty 2

## 2022-10-23 MED ORDER — METFORMIN HCL ER 500 MG PO TB24
1000.0000 mg | ORAL_TABLET | Freq: Two times a day (BID) | ORAL | Status: DC
  Administered 2022-10-24 – 2022-10-25 (×2): 1000 mg via ORAL
  Filled 2022-10-23 (×4): qty 2

## 2022-10-23 NOTE — Plan of Care (Addendum)
Patient remains in Neuro ICU at this time, however, the patient has transfer orders for "Telemetry / Medical". On-going tachycardia, worse with activity. Per patient, he has been experiencing "anxiety attacks" this evening; PRN ativan given (see MAR). Patient ambulated independently with this RN two laps around the 4N ICU this afternoon as well without symptoms of distress.  Edit: 2100 hrs: Patient exhibits new symptom of emesis ( x 1 ). Patient denies HA, SHOB, vision changes, or dizziness; neuro exam otherwise unchanged; patient endorses dull substernal CP that has been going on "all day". Dr. Amada Jupiter notified. New orders received for 12-lead EKG, serial troponin, and the orders for Zofran are modified. New set of VS obtained and documented. EKG obtained by this RN (available ofr view in [facility] and on patient's shadow chart) and is unremarkable. Patient is now resting in bed and denies on-going nausea. See MAR for medications given.   Problem: Education: Goal: Knowledge of disease or condition will improve Outcome: Not Progressing Goal: Knowledge of secondary prevention will improve (MUST DOCUMENT ALL) Outcome: Not Progressing Goal: Knowledge of patient specific risk factors will improve Loraine Leriche N/A or DELETE if not current risk factor) Outcome: Not Progressing   Problem: Ischemic Stroke/TIA Tissue Perfusion: Goal: Complications of ischemic stroke/TIA will be minimized Outcome: Not Progressing   Problem: Coping: Goal: Will verbalize positive feelings about self Outcome: Not Progressing Goal: Will identify appropriate support needs Outcome: Not Progressing   Problem: Health Behavior/Discharge Planning: Goal: Ability to manage health-related needs will improve Outcome: Not Progressing Goal: Goals will be collaboratively established with patient/family Outcome: Not Progressing   Problem: Self-Care: Goal: Ability to participate in self-care as condition permits will improve Outcome: Not  Progressing Goal: Verbalization of feelings and concerns over difficulty with self-care will improve Outcome: Not Progressing Goal: Ability to communicate needs accurately will improve Outcome: Not Progressing   Problem: Nutrition: Goal: Risk of aspiration will decrease Outcome: Not Progressing Goal: Dietary intake will improve Outcome: Not Progressing   Problem: Education: Goal: Ability to describe self-care measures that may prevent or decrease complications (Diabetes Survival Skills Education) will improve Outcome: Not Progressing Goal: Individualized Educational Video(s) Outcome: Not Progressing   Problem: Coping: Goal: Ability to adjust to condition or change in health will improve Outcome: Not Progressing   Problem: Fluid Volume: Goal: Ability to maintain a balanced intake and output will improve Outcome: Not Progressing   Problem: Health Behavior/Discharge Planning: Goal: Ability to identify and utilize available resources and services will improve Outcome: Not Progressing Goal: Ability to manage health-related needs will improve Outcome: Not Progressing   Problem: Metabolic: Goal: Ability to maintain appropriate glucose levels will improve Outcome: Not Progressing   Problem: Nutritional: Goal: Maintenance of adequate nutrition will improve Outcome: Not Progressing Goal: Progress toward achieving an optimal weight will improve Outcome: Not Progressing   Problem: Skin Integrity: Goal: Risk for impaired skin integrity will decrease Outcome: Not Progressing   Problem: Tissue Perfusion: Goal: Adequacy of tissue perfusion will improve Outcome: Not Progressing

## 2022-10-23 NOTE — Progress Notes (Signed)
Pt stated to this RN that his chest was hurting. Earlier in the day he described the feeling as "indigestion", and this RN was given verbal orders for PRN calcium carbonate for pt. Pt reported no relief from PRN med and that symptoms improve with laying on stomach. This RN performed 12-lead EKG as a precaution. EKG interpretation stated, "sinus tachycardia," but no other findings noted. Unfortunately, EKG did not file over to pt chart. Stroke NP notified of pt symptoms and EKG reading. Will continue to monitor pt status.

## 2022-10-23 NOTE — Plan of Care (Signed)
  Problem: Education: Goal: Knowledge of disease or condition will improve Outcome: Progressing Goal: Knowledge of secondary prevention will improve (MUST DOCUMENT ALL) Outcome: Progressing Goal: Knowledge of patient specific risk factors will improve (Mark N/A or DELETE if not current risk factor) Outcome: Progressing   Problem: Ischemic Stroke/TIA Tissue Perfusion: Goal: Complications of ischemic stroke/TIA will be minimized Outcome: Progressing   Problem: Coping: Goal: Will verbalize positive feelings about self Outcome: Progressing Goal: Will identify appropriate support needs Outcome: Progressing   Problem: Health Behavior/Discharge Planning: Goal: Ability to manage health-related needs will improve Outcome: Progressing Goal: Goals will be collaboratively established with patient/family Outcome: Progressing   Problem: Self-Care: Goal: Ability to participate in self-care as condition permits will improve Outcome: Progressing Goal: Verbalization of feelings and concerns over difficulty with self-care will improve Outcome: Progressing Goal: Ability to communicate needs accurately will improve Outcome: Progressing   Problem: Nutrition: Goal: Risk of aspiration will decrease Outcome: Progressing Goal: Dietary intake will improve Outcome: Progressing   Problem: Education: Goal: Ability to describe self-care measures that may prevent or decrease complications (Diabetes Survival Skills Education) will improve Outcome: Progressing Goal: Individualized Educational Video(s) Outcome: Progressing   Problem: Coping: Goal: Ability to adjust to condition or change in health will improve Outcome: Progressing   Problem: Fluid Volume: Goal: Ability to maintain a balanced intake and output will improve Outcome: Progressing   Problem: Health Behavior/Discharge Planning: Goal: Ability to identify and utilize available resources and services will improve Outcome:  Progressing Goal: Ability to manage health-related needs will improve Outcome: Progressing   Problem: Metabolic: Goal: Ability to maintain appropriate glucose levels will improve Outcome: Progressing   Problem: Nutritional: Goal: Maintenance of adequate nutrition will improve Outcome: Progressing Goal: Progress toward achieving an optimal weight will improve Outcome: Progressing   Problem: Skin Integrity: Goal: Risk for impaired skin integrity will decrease Outcome: Progressing   Problem: Tissue Perfusion: Goal: Adequacy of tissue perfusion will improve Outcome: Progressing   

## 2022-10-23 NOTE — Progress Notes (Signed)
ANTICOAGULATION CONSULT NOTE  Pharmacy Consult for Apixaban Indication: hypercoagulable state  Allergies  Allergen Reactions   Hydrocodone Hives    Patient Measurements: Height: 5\' 10"  (177.8 cm) Weight: 97 kg (213 lb 13.5 oz) IBW/kg (Calculated) : 73  Vital Signs: Temp: 99.6 F (37.6 C) (06/09 0835) Temp Source: Oral (06/09 0400) BP: 130/84 (06/09 0600) Pulse Rate: 104 (06/09 0700)  Labs: Recent Labs    10/21/22 1305 10/22/22 0151 10/23/22 0234  HGB 13.3 13.0 12.3*  HCT 42.3 40.4 39.6  PLT 138* 123* 114*  APTT 25  --   --   LABPROT 13.4  --   --   INR 1.0  --   --   CREATININE 0.99 1.09 1.00    Estimated Creatinine Clearance: 97.5 mL/min (by C-G formula based on SCr of 1 mg/dL).   Medical History: Past Medical History:  Diagnosis Date   Allergic rhinitis    Anxiety    Arthritis    Chemotherapy-induced thrombocytopenia    Chronic nausea    mild intermittant and loose stools due to colon cancer   Chronic neck and back pain    Depression    ED (erectile dysfunction)    Generalized idiopathic epilepsy and epileptic syndromes, not intractable, without status epilepticus (HCC) 10/25/2019   (11-10-2020 per pt wife last seizure 11/ 2021)  neurologist-- dr Karel Jarvis---  seizure activity/ DKA ;  EEG w/ evidence epileto genicity arising right frontocentral region;  taking keppra   GERD (gastroesophageal reflux disease)    Hiatal hernia    History of 2019 novel coronavirus disease (COVID-19) 12/13/2018   per pt wife , pt had moderate symtptoms that resolved   History of Graves' disease    s/p RAI 02/ 2012;  previous seen by endocrinologist--- dr Everardo All   History of kidney stones    History of non-ST elevation myocardial infarction (NSTEMI)    Jan 2014--  no CAD;  per notes probable coronary vasospasm   History of panic attacks    History of thyroid storm    Nov 2011 w/ grave's ,  s/p RAI 02/ 2012   Hyperlipidemia    Hypertension    Hypothyroidism following  radioiodine therapy    RAI in 02/  2012---  previously followed by dr Everardo All,  now followed by pcp   Lower urinary tract symptoms (LUTS)    Metastatic colon cancer to liver (HCC) 11/2015   oncologist-- [facility]/ and seen by dr a. Maisie Fus;  01-14-2016 s/p sigmoid colectomy, liver bx, node dissection, PAC;   chemo started 09/ 2017  ;  04/ 2021 metastatic pulmonary nodules;   active treatment every 2 wks   Nephrolithiasis    left side non-obstructive per imaging 04/ 2022   PAD (peripheral artery disease) [facility]) cardiologist-  dr Kirke Corin   cardiologist ----  dr Judie Petit. Kirke Corin ;   left popliteal occlusion behind knee w/ collaterals;  s/p attempted angioplasty 01/ 2016;  09-11-2015 s/p pop-TPT w/ ipsNR GSV   PONV (postoperative nausea and vomiting)    Rash    due to chemo   Type 2 diabetes mellitus treated with insulin (HCC)    followed by pcp----   (11-10-2020  per pt wife blood sugar check's 2-4 times daily,  fasting sugar--- 130--168)   Unspecified fracture of left foot, initial encounter for closed fracture 10/17/2020   midfoot    Assessment: 56 y.o. male with medical history significant for active colon cancer on chemotherapy with liver and lung metastasis, thrombocytopenia admitted for lright  MCA s/p TNK 6/08 @ 1300. Pharmacy consulted to start apixaban.  Goal of Therapy:  Monitor platelets by anticoagulation protocol: Yes   Plan:  Apixaban 5mg  PO twice daily starting 6/09 PM Continue to monitor H&H and platelets    Of note, copay check pending 6/10  Thank you for allowing pharmacy to be a part of this patient's care.  Thelma Barge, PharmD Clinical Pharmacist

## 2022-10-23 NOTE — Progress Notes (Signed)
SLP Cancellation Note  Patient Details Name: Jeff [name] MRN: [id number] DOB: 08-13-1965   Cancelled treatment:       Reason Eval/Treat Not Completed: SLP screened, no needs identified, will sign off SLP arrived for speech/language evaluation. Upon arrival, pt had family in room. Pt and family all reported complete resolution of dysarthria and denied any cognitive linguistic concerns. Brief conversation with patient was unremarkable. He declined formal evaluation. No expected SLP needs.  Jeff [name], M.S., CCC-SLP Speech-Language Pathologist Acute Rehabilitation Services Pager: [phone]  Susanne Borders Jaren Vanetten 10/23/2022, 11:00 AM

## 2022-10-23 NOTE — Progress Notes (Signed)
STROKE TEAM PROGRESS NOTE   SUBJECTIVE (INTERVAL HISTORY) His wife and son are at the bedside. Pt left facial droop has improved, still has left UE paresis. PT and OT recommend outpt. Today he has tachycardia, HR 110-120, restart metoprolol. BP also elevated than yesterday, put back on amlodipine too. Pan CT showed some cancer progression, along with intermittent heart palpitation per pt and family, leaning towards anticoagulation at this time, discussed with Dr. Truett Perna and pt and family, they are in agreement.    OBJECTIVE Temp:  [96.9 F (36.1 C)-99.6 F (37.6 C)] 99.6 F (37.6 C) (06/09 0835) Pulse Rate:  [80-142] 104 (06/09 0700) Cardiac Rhythm: Sinus tachycardia (06/09 0400) Resp:  [10-26] 13 (06/09 0700) BP: (122-159)/(84-109) 130/84 (06/09 0600) SpO2:  [87 %-98 %] 93 % (06/09 0700)  Recent Labs  Lab 10/22/22 0751 10/22/22 1146 10/22/22 1632 10/22/22 2218 10/23/22 0840  GLUCAP 139* 137* 124* 144* 180*   Recent Labs  Lab 10/21/22 1304 10/21/22 1305 10/22/22 0151 10/23/22 0234  NA 139 136 137 136  K 4.0 4.1 3.8 3.7  CL 99 102 100 100  CO2  --  25 25 24   GLUCOSE 163* 164* 121* 111*  BUN 3* 5* 6 7  CREATININE 1.00 0.99 1.09 1.00  CALCIUM  --  8.5* 8.2* 8.8*   Recent Labs  Lab 10/21/22 1305 10/22/22 0151  AST 51* 43*  ALT 46* 42  ALKPHOS 75 74  BILITOT 0.9 0.9  PROT 6.6 6.9  ALBUMIN 3.3* 3.3*   Recent Labs  Lab 10/21/22 1304 10/21/22 1305 10/22/22 0151 10/23/22 0234  WBC  --  8.0 7.4 6.5  NEUTROABS  --  4.6  --   --   HGB 14.3 13.3 13.0 12.3*  HCT 42.0 42.3 40.4 39.6  MCV  --  90.4 88.0 89.4  PLT  --  138* 123* 114*   No results for input(s): "CKTOTAL", "CKMB", "CKMBINDEX", "TROPONINI" in the last 168 hours. Recent Labs    10/21/22 1305  LABPROT 13.4  INR 1.0   Recent Labs    10/21/22 1300  COLORURINE YELLOW  LABSPEC 1.029  PHURINE 7.0  GLUCOSEU >=500*  HGBUR SMALL*  BILIRUBINUR NEGATIVE  KETONESUR NEGATIVE  PROTEINUR NEGATIVE   NITRITE NEGATIVE  LEUKOCYTESUR NEGATIVE       Component Value Date/Time   CHOL 136 10/21/2022 2121   CHOL 139 04/16/2019 0842   TRIG 184 (H) 10/21/2022 2121   HDL 31 (L) 10/21/2022 2121   HDL 36 (L) 04/16/2019 0842   CHOLHDL 4.4 10/21/2022 2121   VLDL 37 10/21/2022 2121   LDLCALC 68 10/21/2022 2121   LDLCALC 68 04/16/2019 0842   Lab Results  Component Value Date   HGBA1C 7.5 (H) 10/21/2022      Component Value Date/Time   LABOPIA NONE DETECTED 10/21/2022 1300   COCAINSCRNUR NONE DETECTED 10/21/2022 1300   LABBENZ NONE DETECTED 10/21/2022 1300   AMPHETMU NONE DETECTED 10/21/2022 1300   THCU NONE DETECTED 10/21/2022 1300   LABBARB NONE DETECTED 10/21/2022 1300    Recent Labs  Lab 10/21/22 1306  ETH <10    I have personally reviewed the radiological images below and agree with the radiology interpretations.  CT CHEST ABDOMEN PELVIS W CONTRAST  Result Date: 10/22/2022 CLINICAL DATA:  Metastatic colon cancer, restaging examination. *Tracking code: [email or web address]* EXAM: CT CHEST, ABDOMEN, AND PELVIS WITH CONTRAST TECHNIQUE: Multidetector CT imaging of the chest, abdomen and pelvis was performed following the standard protocol during bolus administration of intravenous contrast. RADIATION  DOSE REDUCTION: This exam was performed according to the departmental dose-optimization program which includes automated exposure control, adjustment of the mA and/or kV according to patient size and/or use of iterative reconstruction technique. CONTRAST:  75mL OMNIPAQUE IOHEXOL 350 MG/ML SOLN COMPARISON:  08/11/2022 FINDINGS: CT CHEST FINDINGS Cardiovascular: Cardiac size within normal limits. Mild coronary artery calcification. No pericardial effusion. Central pulmonary arteries are of normal caliber. Mild atherosclerotic calcification within the thoracic aorta. No aortic aneurysm. Right internal jugular chest port tip noted within the superior vena cava. Mediastinum/Nodes: Pathologic right hilar adenopathy is  stable with the index lymph node measuring 1.2 x 1.8 cm at axial image # 26/3. Progressive right paraesophageal adenopathy with the index lymph node measuring 14 mm in short axis diameter at axial image # 32/3, previously measuring 7 mm. New pericardial adenopathy with the index lymph node measuring 2.1 x 3.5 cm at axial image # 42/3, new since prior exam. Thyroid is atrophic. Esophagus is unremarkable. Lungs/Pleura: Numerous multilobulated pulmonary metastases are stable in size and number. Index lesion within the right middle lobe measures 2.8 x 3.6 cm at axial image # 81/4. Index lesion within the lingula measures 3.1 x 4.1 cm at axial image # 82/4. New trace right pleural effusion. No pneumothorax. No central obstructing lesion. Musculoskeletal: No focal lytic or blastic bone lesion. CT ABDOMEN PELVIS FINDINGS Hepatobiliary: Numerous hypoenhancing metastatic foci are seen within the liver bilaterally, progressive since prior examination. Index lesion within the left hepatic lobe, axial image # 48/3, measures 2.1 x 2.4 cm. Index lesion within the right hepatic lobe at axial image # 50/3 measures 1.9 x 2.0 cm. Mild hepatic steatosis. Cholelithiasis noted without pericholecystic inflammatory change. No intra or extrahepatic biliary ductal dilation. Pancreas: Unremarkable Spleen: Unremarkable Adrenals/Urinary Tract: The adrenal glands are unremarkable. The kidneys are normal in size and position. 4 mm nonobstructing calculus again noted within the interpolar region of the left kidney. No hydronephrosis. No ureteral calculi. The bladder is unremarkable. Stomach/Bowel: Status post sigmoid colectomy. The stomach, small bowel, and large bowel are otherwise unremarkable. Appendix normal. No free intraperitoneal gas or fluid. Vascular/Lymphatic: Mild aortoiliac atherosclerotic calcification. No aortic aneurysm. Pathologic periportal adenopathy has progressed with the index lymph node measuring 1.8 x 3.1 cm at axial image  # 63/3. Left periaortic adenopathy appears stable measuring 1.4 x 2.3 cm at axial image # 89/3. Reproductive: Moderate prostatic hypertrophy. Other: No abdominal wall hernia. Musculoskeletal: L4-S1 lumbar fusion with instrumentation and L5 posterior decompression has been performed. No acute bone abnormality. No lytic or blastic bone lesion. IMPRESSION: 1. Progressive disease with progressive mediastinal, pericardial, and periportal adenopathy as well as progressive hepatic metastatic disease. Stable pulmonary metastatic disease. 2. Mild coronary artery calcification. 3. Cholelithiasis. 4. Mild left nonobstructing nephrolithiasis. 5. Moderate prostatic hypertrophy. 6. Status post sigmoid colectomy. Aortic Atherosclerosis (ICD10-I70.0). Electronically Signed   By: Helyn Numbers M.D.   On: 10/22/2022 23:42   ECHOCARDIOGRAM COMPLETE  Result Date: 10/22/2022    ECHOCARDIOGRAM REPORT   Patient Name:   Jeff [name] [facility] Date of Exam: 10/22/2022 Medical Rec #:  [id number]        Height:       70.0 in Accession #:    [id number]       Weight:       213.8 lb Date of Birth:  1965/05/19        BSA:          2.148 m Patient Age:    89 years  BP:           150/100 mmHg Patient Gender: M                HR:           97 bpm. Exam Location:  Inpatient Procedure: 2D Echo, Cardiac Doppler and Color Doppler Indications:    Stroke  History:        Patient has no prior history of Echocardiogram examinations.                 Stroke and PAD; Risk Factors:Hypertension, Sleep Apnea and                 Diabetes.  Sonographer:    Lucy Antigua Referring Phys: [id number] Lynnae January IMPRESSIONS  1. Left ventricular ejection fraction, by estimation, is 60 to 65%. The left ventricle has normal function. The left ventricle has no regional wall motion abnormalities. There is mild concentric left ventricular hypertrophy. Left ventricular diastolic parameters are consistent with Grade I diastolic dysfunction (impaired relaxation).  2. Right  ventricular systolic function is normal. The right ventricular size is normal. Tricuspid regurgitation signal is inadequate for assessing PA pressure.  3. The mitral valve is grossly normal. Trivial mitral valve regurgitation. No evidence of mitral stenosis.  4. The aortic valve is tricuspid. Aortic valve regurgitation is not visualized. No aortic stenosis is present.  5. Ascending aorta not well seen. Measurments likely inaccurate. Recent CT with normal aortic measurements.  6. The inferior vena cava is normal in size with greater than 50% respiratory variability, suggesting right atrial pressure of 3 mmHg. Conclusion(s)/Recommendation(s): No intracardiac source of embolism detected on this transthoracic study. Consider a transesophageal echocardiogram to exclude cardiac source of embolism if clinically indicated. FINDINGS  Left Ventricle: Left ventricular ejection fraction, by estimation, is 60 to 65%. The left ventricle has normal function. The left ventricle has no regional wall motion abnormalities. The left ventricular internal cavity size was normal in size. There is  mild concentric left ventricular hypertrophy. Left ventricular diastolic parameters are consistent with Grade I diastolic dysfunction (impaired relaxation). Right Ventricle: The right ventricular size is normal. No increase in right ventricular wall thickness. Right ventricular systolic function is normal. Tricuspid regurgitation signal is inadequate for assessing PA pressure. Left Atrium: Left atrial size was normal in size. Right Atrium: Right atrial size was normal in size. Pericardium: Trivial pericardial effusion is present. Mitral Valve: The mitral valve is grossly normal. Trivial mitral valve regurgitation. No evidence of mitral valve stenosis. Tricuspid Valve: The tricuspid valve is grossly normal. Tricuspid valve regurgitation is trivial. No evidence of tricuspid stenosis. Aortic Valve: The aortic valve is tricuspid. Aortic valve  regurgitation is not visualized. No aortic stenosis is present. Aortic valve mean gradient measures 2.0 mmHg. Aortic valve peak gradient measures 3.5 mmHg. Aortic valve area, by VTI measures 4.01 cm. Pulmonic Valve: The pulmonic valve was grossly normal. Pulmonic valve regurgitation is not visualized. Aorta: Ascending aorta not well seen. Measurments likely inaccurate. Recent CT with normal aortic measurements. The aortic root is normal in size and structure. Venous: The inferior vena cava is normal in size with greater than 50% respiratory variability, suggesting right atrial pressure of 3 mmHg. IAS/Shunts: The atrial septum is grossly normal.  LEFT VENTRICLE PLAX 2D LVIDd:         5.00 cm   Diastology LVIDs:         3.20 cm   LV e' medial:    6.84 cm/s  LV PW:         1.20 cm   LV E/e' medial:  7.4 LV IVS:        1.10 cm   LV e' lateral:   4.82 cm/s LVOT diam:     2.20 cm   LV E/e' lateral: 10.5 LV SV:         65 LV SV Index:   30 LVOT Area:     3.80 cm  RIGHT VENTRICLE RV S prime:     19.70 cm/s TAPSE (M-mode): 2.1 cm LEFT ATRIUM             Index        RIGHT ATRIUM           Index LA Vol (A2C):   29.1 ml 13.55 ml/m  RA Area:     13.50 cm LA Vol (A4C):   41.3 ml 19.23 ml/m  RA Volume:   31.00 ml  14.44 ml/m LA Biplane Vol: 37.6 ml 17.51 ml/m  AORTIC VALVE AV Area (Vmax):    3.60 cm AV Area (Vmean):   3.56 cm AV Area (VTI):     4.01 cm AV Vmax:           93.60 cm/s AV Vmean:          64.200 cm/s AV VTI:            0.163 m AV Peak Grad:      3.5 mmHg AV Mean Grad:      2.0 mmHg LVOT Vmax:         88.60 cm/s LVOT Vmean:        60.100 cm/s LVOT VTI:          0.172 m LVOT/AV VTI ratio: 1.06  AORTA Ao Root diam: 3.80 cm Ao Asc diam:  4.40 cm MITRAL VALVE MV Area (PHT): 2.91 cm    SHUNTS MV Decel Time: 261 msec    Systemic VTI:  0.17 m MV E velocity: 50.70 cm/s  Systemic Diam: 2.20 cm MV A velocity: 60.00 cm/s MV E/A ratio:  0.85 Lennie Odor MD Electronically signed by Lennie Odor MD Signature Date/Time:  10/22/2022/3:43:42 PM    Final    VAS [place] LOWER EXTREMITY VENOUS (DVT)  Result Date: 10/22/2022  Lower Venous DVT Study Patient Name:  ESSEX HABA [facility]  Date of Exam:   10/22/2022 Medical Rec #: [id number]         Accession #:    [id number] Date of Birth: 1965-11-28         Patient Gender: M Patient Age:   55 years Exam Location:  [facility] Procedure:      VAS [place] LOWER EXTREMITY VENOUS (DVT) Referring Phys: Scheryl Marten Bellany Elbaum --------------------------------------------------------------------------------  Indications: Stroke.  Comparison Study: No previous study. Performing Technologist: McKayla Maag RVT, [place]  Examination Guidelines: A complete evaluation includes B-mode imaging, spectral Doppler, color Doppler, and power Doppler as needed of all accessible portions of each vessel. Bilateral testing is considered an integral part of a complete examination. Limited examinations for reoccurring indications may be performed as noted. The reflux portion of the exam is performed with the patient in reverse Trendelenburg.  +---------+---------------+---------+-----------+----------+--------------+ RIGHT    CompressibilityPhasicitySpontaneityPropertiesThrombus Aging +---------+---------------+---------+-----------+----------+--------------+ CFV      Full           Yes      Yes                                 +---------+---------------+---------+-----------+----------+--------------+  SFJ      Full                                                        +---------+---------------+---------+-----------+----------+--------------+ FV Prox  Full                                                        +---------+---------------+---------+-----------+----------+--------------+ FV Mid   Full                                                        +---------+---------------+---------+-----------+----------+--------------+ FV DistalFull                                                         +---------+---------------+---------+-----------+----------+--------------+ PFV      Full                                                        +---------+---------------+---------+-----------+----------+--------------+ POP      Full           Yes      Yes                                 +---------+---------------+---------+-----------+----------+--------------+ PTV      Full                                                        +---------+---------------+---------+-----------+----------+--------------+ PERO     Full                                                        +---------+---------------+---------+-----------+----------+--------------+   +---------+---------------+---------+-----------+----------+--------------+ LEFT     CompressibilityPhasicitySpontaneityPropertiesThrombus Aging +---------+---------------+---------+-----------+----------+--------------+ CFV      Full           Yes      Yes                                 +---------+---------------+---------+-----------+----------+--------------+ SFJ      Full                                                        +---------+---------------+---------+-----------+----------+--------------+  FV Prox  Full                                                        +---------+---------------+---------+-----------+----------+--------------+ FV Mid   Full                                                        +---------+---------------+---------+-----------+----------+--------------+ FV DistalFull                                                        +---------+---------------+---------+-----------+----------+--------------+ PFV      Full                                                        +---------+---------------+---------+-----------+----------+--------------+ POP      Full           Yes      Yes                                  +---------+---------------+---------+-----------+----------+--------------+ PTV      Full                                                        +---------+---------------+---------+-----------+----------+--------------+ PERO     Full                                                        +---------+---------------+---------+-----------+----------+--------------+     Summary: BILATERAL: - No evidence of deep vein thrombosis seen in the lower extremities, bilaterally. - No evidence of superficial venous thrombosis in the lower extremities, bilaterally. -No evidence of popliteal cyst, bilaterally.   *See table(s) above for measurements and observations.    Preliminary    MR BRAIN WO CONTRAST  Result Date: 10/22/2022 CLINICAL DATA:  Stroke follow-up EXAM: MRI HEAD WITHOUT CONTRAST TECHNIQUE: Multiplanar, multiecho pulse sequences of the brain and surrounding structures were obtained without intravenous contrast. COMPARISON:  CT/CTA head and neck and brain MRI 1 day prior FINDINGS: This is a limited study with axial DWI and axial SWI sequences obtained. Brain: There is diffusion restriction in the right frontal lobe with involvement of the precentral gyrus and pre motor region, corona radiata, and operculum consistent with acute infarcts. The extent of the infarcts appears progressed compared to the MRI from 1 day prior. There is no hemorrhage or mass effect. Parenchymal volume is stable. The ventricles are stable in size. There is  no midline shift. IMPRESSION: Limited study with only SWI and DWI sequences obtained. Acute infarcts in the right MCA distribution appear progressed in extent compared to the study from 1 day prior. No hemorrhage or mass effect. Electronically Signed   By: Lesia Hausen M.D.   On: 10/22/2022 11:38   CT ANGIO HEAD NECK W WO CM (CODE STROKE)  Result Date: 10/21/2022 CLINICAL DATA:  Neuro deficit, acute, stroke suspected EXAM: CT ANGIOGRAPHY HEAD AND NECK WITH AND WITHOUT  CONTRAST TECHNIQUE: Multidetector CT imaging of the head and neck was performed using the standard protocol during bolus administration of intravenous contrast. Multiplanar CT image reconstructions and MIPs were obtained to evaluate the vascular anatomy. Carotid stenosis measurements (when applicable) are obtained utilizing NASCET criteria, using the distal internal carotid diameter as the denominator. RADIATION DOSE REDUCTION: This exam was performed according to the departmental dose-optimization program which includes automated exposure control, adjustment of the mA and/or kV according to patient size and/or use of iterative reconstruction technique. CONTRAST:  75mL OMNIPAQUE IOHEXOL 350 MG/ML SOLN COMPARISON:  CT Head 12/18/21 FINDINGS: CT HEAD FINDINGS See same day CT brain for intracranial findings. CTA NECK FINDINGS Aortic arch: Standard branching. Imaged portion shows no evidence of aneurysm or dissection. No significant stenosis of the major arch vessel origins. Right carotid system: No evidence of dissection, stenosis (50% or greater), or occlusion. Left carotid system: No evidence of dissection, stenosis (50% or greater), or occlusion. Vertebral arteries: Codominant. No evidence of dissection, stenosis (50% or greater), or occlusion. Skeleton: Status post C3-C4 ACDF with solid osseous fusion. There is also posterior fusion of the level. Patient is edentulous. Other neck: Negative. Upper chest: Redemonstrated right hilar lymphadenopathy, better assessed on prior CT chest abdomen and pelvis. No pleural effusion. No pneumothorax. Right chest port in place. Mild circumferential esophageal thickening Review of the MIP images confirms the above findings CTA HEAD FINDINGS Anterior circulation: No significant stenosis, proximal occlusion, aneurysm, or vascular malformation. Posterior circulation: No significant stenosis, proximal occlusion, aneurysm, or vascular malformation. Venous sinuses: As permitted by  contrast timing, patent. Anatomic variants: Fetal PCAs bilaterally. Review of the MIP images confirms the above findings IMPRESSION: 1. No intracranial large vessel occlusion or significant stenosis. 2. No hemodynamically significant stenosis in the neck. 3. Redemonstrated right hilar lymphadenopathy, better assessed on prior CT chest abdomen and pelvis. 4. Mild circumferential esophageal thickening, which can be seen in the setting of esophagitis. Electronically Signed   By: Lorenza Cambridge M.D.   On: 10/21/2022 14:25   MR BRAIN W WO CONTRAST  Result Date: 10/21/2022 CLINICAL DATA:  Transient ischemic attack (TIA) Stroke, follow up Neuro deficit, acute, stroke suspected Metastatic disease evaluation. EXAM: MRI HEAD WITHOUT AND WITH CONTRAST TECHNIQUE: Multiplanar, multiecho pulse sequences of the brain and surrounding structures were obtained without and with intravenous contrast. CONTRAST:  9mL GADAVIST GADOBUTROL 1 MMOL/ML IV SOLN COMPARISON:  Head CT 10/21/2022.  MRI brain 04/09/2020. FINDINGS: Brain: Restricted diffusion in the right precentral gyrus and pre motor area, consistent with acute infarct. No acute hemorrhage or significant mass effect. Underlying mild chronic small-vessel disease. No hydrocephalus. Small bilateral middle cranial fossa arachnoid cysts, right-greater-than-left. No mass or abnormal enhancement. No foci of abnormal susceptibility. Vascular: Normal flow voids and vessel enhancement. Skull and upper cervical spine: Partially visualized susceptibility artifact from posterior spinal fusion hardware. Otherwise, normal marrow signal and enhancement. Sinuses/Orbits: Unremarkable. Other: None. IMPRESSION: 1. Acute infarcts in the right precentral gyrus and pre motor area. 2. No evidence of intracranial  metastatic disease. Electronically Signed   By: Orvan Falconer M.D.   On: 10/21/2022 14:14   CT HEAD CODE STROKE WO CONTRAST  Result Date: 10/21/2022 CLINICAL DATA:  Code stroke. Neuro  deficit, acute, stroke suspected left-sided weakness EXAM: CT HEAD WITHOUT CONTRAST TECHNIQUE: Contiguous axial images were obtained from the base of the skull through the vertex without intravenous contrast. RADIATION DOSE REDUCTION: This exam was performed according to the departmental dose-optimization program which includes automated exposure control, adjustment of the mA and/or kV according to patient size and/or use of iterative reconstruction technique. COMPARISON:  CT Head 12/18/21 FINDINGS: Brain: No evidence of acute infarction, hemorrhage, hydrocephalus, extra-axial collection or mass lesion/mass effect. Enlarged and partially empty sella. Vascular: No hyperdense vessel or unexpected calcification. Skull: Normal. Negative for fracture or focal lesion. Sinuses/Orbits: No middle ear or mastoid effusion. Paranasal sinuses clear. Orbits are unremarkable. Other: None. ASPECTS [facility] Stroke Program Early CT Score): 10 IMPRESSION: No hemorrhage or CT evidence of an acute cortical infarct. Aspects 10. Findings were paged to Dr. Derry Lory on 10/21/22 at 1:11 PM via [facility] paging system. Electronically Signed   By: Lorenza Cambridge M.D.   On: 10/21/2022 13:14     PHYSICAL EXAM  Temp:  [96.9 F (36.1 C)-99.6 F (37.6 C)] 99.6 F (37.6 C) (06/09 0835) Pulse Rate:  [80-142] 104 (06/09 0700) Resp:  [10-26] 13 (06/09 0700) BP: (122-159)/(84-109) 130/84 (06/09 0600) SpO2:  [87 %-98 %] 93 % (06/09 0700)  General - Well nourished, well developed, in no apparent distress.  Ophthalmologic - fundi not visualized due to noncooperation.  Cardiovascular - Regular rhythm and rate.  Neuro - awake, alert, eyes open, orientated to age, place, year and people, he tried twice to get month right. No aphasia, fluent language but paucity of speech, following all simple commands. Able to name and repeat. No gaze palsy, tracking bilaterally, visual field full. Left mild facial droop. Tongue midline. RUE 5/5, LUE pronator  drift. Bilaterally LEs 5/5, no drift. Sensation symmetrical bilaterally, b/l FTN intact but slow on the left, gait not tested.    ASSESSMENT/PLAN Mr. MAYES SCHUDEL is a 57 y.o. male with history of hypertension, diabetes, recurrent seizure on Keppra, PAD status post bypass, Graves' disease status post radiation, colon cancer on chemotherapy with liver and lung metastasis, thrombocytopenia admitted for left-sided weakness, left facial droop.  TNK given.    Stroke:  right MCA cortical small infarcts, embolic pattern, etiology unclear, could be from cardioembolic source versus hypercoag state from advanced cancer CT no acute abnormality CT head and neck unremarkable MRI right MCA scattered cortical infarcts, no evidence of intracranial metastasis disease 2D Echo EF 60 to 65% LE venous Doppler no DVT LDL 68 HgbA1c 7.5 UDS negative Lovenox for VTE prophylaxis aspirin 81 mg daily prior to admission, on aspirin 81 mg daily and clopidogrel 75 mg daily. Given cancer progression, along with intermittent heart palpitation, decided to switch to eliquis for [facility]. Patient counseled to be compliant with his antithrombotic medications Ongoing aggressive stroke risk factor management Therapy recommendations: Outpatient PT OT Disposition: Pending  Colon cancer with metastasis Diagnosed colon cancer in 2017 Status post multiple rounds of chemotherapy with long and recurrent liver metastasis Follow-up with Dr. Truett Perna at [facility] [place] Progressive disease with progressive mediastinal, pericardial, and periportal adenopathy as well as progressive hepatic metastatic disease. Stable pulmonary metastatic disease. Decided on anticoagulation after discussion with Dr. Truett Perna  Heart palpitation Per patient and wife, patient does have infrequent heart palpitation More  frequent palpitation episodes in the past due to Graves' disease,  has improved after treatment Not sure if A-fib episode, but decided on  anticoagulation after discussion with pt and family as well as Dr. Truett Perna Pt will continue to follow up with his cardiologist Dr. Kirke Corin.   Diabetes HgbA1c 7.5 goal < 7.0 Uncontrolled Resume farxiga and metoprolol CBG monitoring SSI DM education and close PCP follow up  Hypertension Stable on the high side Resume home metoprolol and amlodipine BP goal <180/105 Long term BP goal normotensive  Hyperlipidemia Home meds: Lipitor 80 LDL 68, goal < 70 Now on Lipitor 80 Continue statin at discharge  Other Stroke Risk Factors Obesity, Body mass index is 30.68 kg/m.  PAD status post bypass  Other Active Problems Graves' disease status post radiation, not on levothyroxine Recurrent seizure, on Keppra 700 mg twice daily - follows with Dr. Karel Jarvis at [facility] Elevated LFT, AST/ALT 51/46->43/42  Hospital day # 2  This patient is critically ill due to acute stroke status post TNK, uncontrolled diabetes, colon cancer with metastasis, history of seizure and at significant risk of neurological worsening, death form recurrent stroke, hemorrhagic transmission, status epilepticus. This patient's care requires constant monitoring of vital signs, hemodynamics, respiratory and cardiac monitoring, review of multiple databases, neurological assessment, discussion with family, other specialists and medical decision making of high complexity. I spent 35 minutes of neurocritical care time in the care of this patient. I had long discussion with wife and patient at bedside, updated pt current condition, treatment plan and potential prognosis, and answered all the questions.  They expressed understanding and appreciation.   Marvel Plan, MD PhD Stroke Neurology 10/23/2022 10:47 AM    To contact Stroke Continuity provider, please refer to [email or web address]. After hours, contact General Neurology

## 2022-10-23 NOTE — Progress Notes (Signed)
IP PROGRESS NOTE  Subjective:   [name] reports improvement in left hand weakness.  He has anterior chest "tightness cold and congestion this morning.  Objective: Vital signs in last 24 hours: Blood pressure 130/84, pulse (!) 117, temperature 97.9 F (36.6 C), temperature source Oral, resp. rate 16, height 5\' 10"  (1.778 m), weight 213 lb 13.5 oz (97 kg), SpO2 97 %.  Intake/Output from previous day: 06/08 0701 - 06/09 0700 In: 2330 [P.O.:2330] Out: 1385 [Urine:1385]  Physical Exam:  HEENT: No thrush or ulcers Lungs: Clear bilaterally Cardiac: Regular rate and rhythm Abdomen: No hepatosplenomegaly Extremities: No leg edema Neurologic: Alert and oriented, speech is fluent, the face is symmetric, weakness of the left hand-improved from yesterday Portacath/PICC-without erythema  Lab Results: Recent Labs    10/22/22 0151 10/23/22 0234  WBC 7.4 6.5  HGB 13.0 12.3*  HCT 40.4 39.6  PLT 123* 114*    BMET Recent Labs    10/22/22 0151 10/23/22 0234  NA 137 136  K 3.8 3.7  CL 100 100  CO2 25 24  GLUCOSE 121* 111*  BUN 6 7  CREATININE 1.09 1.00  CALCIUM 8.2* 8.8*    Lab Results  Component Value Date   CEA1 3.19 09/10/2020   CEA 203.54 (H) 10/11/2022    Studies/Results: CT CHEST ABDOMEN PELVIS W CONTRAST  Result Date: 10/22/2022 CLINICAL DATA:  Metastatic colon cancer, restaging examination. *Tracking code: [id number]* EXAM: CT CHEST, ABDOMEN, AND PELVIS WITH CONTRAST TECHNIQUE: Multidetector CT imaging of the chest, abdomen and pelvis was performed following the standard protocol during bolus administration of intravenous contrast. RADIATION DOSE REDUCTION: This exam was performed according to the departmental dose-optimization program which includes automated exposure control, adjustment of the mA and/or kV according to patient size and/or use of iterative reconstruction technique. CONTRAST:  75mL OMNIPAQUE IOHEXOL 350 MG/ML SOLN COMPARISON:  08/11/2022 FINDINGS: CT CHEST  FINDINGS Cardiovascular: Cardiac size within normal limits. Mild coronary artery calcification. No pericardial effusion. Central pulmonary arteries are of normal caliber. Mild atherosclerotic calcification within the thoracic aorta. No aortic aneurysm. Right internal jugular chest port tip noted within the superior vena cava. Mediastinum/Nodes: Pathologic right hilar adenopathy is stable with the index lymph node measuring 1.2 x 1.8 cm at axial image # 26/3. Progressive right paraesophageal adenopathy with the index lymph node measuring 14 mm in short axis diameter at axial image # 32/3, previously measuring 7 mm. New pericardial adenopathy with the index lymph node measuring 2.1 x 3.5 cm at axial image # 42/3, new since prior exam. Thyroid is atrophic. Esophagus is unremarkable. Lungs/Pleura: Numerous multilobulated pulmonary metastases are stable in size and number. Index lesion within the right middle lobe measures 2.8 x 3.6 cm at axial image # 81/4. Index lesion within the lingula measures 3.1 x 4.1 cm at axial image # 82/4. New trace right pleural effusion. No pneumothorax. No central obstructing lesion. Musculoskeletal: No focal lytic or blastic bone lesion. CT ABDOMEN PELVIS FINDINGS Hepatobiliary: Numerous hypoenhancing metastatic foci are seen within the liver bilaterally, progressive since prior examination. Index lesion within the left hepatic lobe, axial image # 48/3, measures 2.1 x 2.4 cm. Index lesion within the right hepatic lobe at axial image # 50/3 measures 1.9 x 2.0 cm. Mild hepatic steatosis. Cholelithiasis noted without pericholecystic inflammatory change. No intra or extrahepatic biliary ductal dilation. Pancreas: Unremarkable Spleen: Unremarkable Adrenals/Urinary Tract: The adrenal glands are unremarkable. The kidneys are normal in size and position. 4 mm nonobstructing calculus again noted within the interpolar region  of the left kidney. No hydronephrosis. No ureteral calculi. The bladder is  unremarkable. Stomach/Bowel: Status post sigmoid colectomy. The stomach, small bowel, and large bowel are otherwise unremarkable. Appendix normal. No free intraperitoneal gas or fluid. Vascular/Lymphatic: Mild aortoiliac atherosclerotic calcification. No aortic aneurysm. Pathologic periportal adenopathy has progressed with the index lymph node measuring 1.8 x 3.1 cm at axial image # 63/3. Left periaortic adenopathy appears stable measuring 1.4 x 2.3 cm at axial image # 89/3. Reproductive: Moderate prostatic hypertrophy. Other: No abdominal wall hernia. Musculoskeletal: L4-S1 lumbar fusion with instrumentation and L5 posterior decompression has been performed. No acute bone abnormality. No lytic or blastic bone lesion. IMPRESSION: 1. Progressive disease with progressive mediastinal, pericardial, and periportal adenopathy as well as progressive hepatic metastatic disease. Stable pulmonary metastatic disease. 2. Mild coronary artery calcification. 3. Cholelithiasis. 4. Mild left nonobstructing nephrolithiasis. 5. Moderate prostatic hypertrophy. 6. Status post sigmoid colectomy. Aortic Atherosclerosis (ICD10-I70.0). Electronically Signed   By: Helyn Numbers M.D.   On: 10/22/2022 23:42   ECHOCARDIOGRAM COMPLETE  Result Date: 10/22/2022    ECHOCARDIOGRAM REPORT   Patient Name:   Jeff [name] [facility] Date of Exam: 10/22/2022 Medical Rec #:  [id number]        Height:       70.0 in Accession #:    [id number]       Weight:       213.8 lb Date of Birth:  12-03-1965        BSA:          2.148 m Patient Age:    57 years         BP:           150/100 mmHg Patient Gender: M                HR:           97 bpm. Exam Location:  Inpatient Procedure: 2D Echo, Cardiac Doppler and Color Doppler Indications:    Stroke  History:        Patient has no prior history of Echocardiogram examinations.                 Stroke and PAD; Risk Factors:Hypertension, Sleep Apnea and                 Diabetes.  Sonographer:    Lucy Antigua Referring Phys:  [id number] Lynnae January IMPRESSIONS  1. Left ventricular ejection fraction, by estimation, is 60 to 65%. The left ventricle has normal function. The left ventricle has no regional wall motion abnormalities. There is mild concentric left ventricular hypertrophy. Left ventricular diastolic parameters are consistent with Grade I diastolic dysfunction (impaired relaxation).  2. Right ventricular systolic function is normal. The right ventricular size is normal. Tricuspid regurgitation signal is inadequate for assessing PA pressure.  3. The mitral valve is grossly normal. Trivial mitral valve regurgitation. No evidence of mitral stenosis.  4. The aortic valve is tricuspid. Aortic valve regurgitation is not visualized. No aortic stenosis is present.  5. Ascending aorta not well seen. Measurments likely inaccurate. Recent CT with normal aortic measurements.  6. The inferior vena cava is normal in size with greater than 50% respiratory variability, suggesting right atrial pressure of 3 mmHg. Conclusion(s)/Recommendation(s): No intracardiac source of embolism detected on this transthoracic study. Consider a transesophageal echocardiogram to exclude cardiac source of embolism if clinically indicated. FINDINGS  Left Ventricle: Left ventricular ejection fraction, by estimation, is 60 to 65%. The left ventricle has normal  function. The left ventricle has no regional wall motion abnormalities. The left ventricular internal cavity size was normal in size. There is  mild concentric left ventricular hypertrophy. Left ventricular diastolic parameters are consistent with Grade I diastolic dysfunction (impaired relaxation). Right Ventricle: The right ventricular size is normal. No increase in right ventricular wall thickness. Right ventricular systolic function is normal. Tricuspid regurgitation signal is inadequate for assessing PA pressure. Left Atrium: Left atrial size was normal in size. Right Atrium: Right atrial size was normal in  size. Pericardium: Trivial pericardial effusion is present. Mitral Valve: The mitral valve is grossly normal. Trivial mitral valve regurgitation. No evidence of mitral valve stenosis. Tricuspid Valve: The tricuspid valve is grossly normal. Tricuspid valve regurgitation is trivial. No evidence of tricuspid stenosis. Aortic Valve: The aortic valve is tricuspid. Aortic valve regurgitation is not visualized. No aortic stenosis is present. Aortic valve mean gradient measures 2.0 mmHg. Aortic valve peak gradient measures 3.5 mmHg. Aortic valve area, by VTI measures 4.01 cm. Pulmonic Valve: The pulmonic valve was grossly normal. Pulmonic valve regurgitation is not visualized. Aorta: Ascending aorta not well seen. Measurments likely inaccurate. Recent CT with normal aortic measurements. The aortic root is normal in size and structure. Venous: The inferior vena cava is normal in size with greater than 50% respiratory variability, suggesting right atrial pressure of 3 mmHg. IAS/Shunts: The atrial septum is grossly normal.  LEFT VENTRICLE PLAX 2D LVIDd:         5.00 cm   Diastology LVIDs:         3.20 cm   LV e' medial:    6.84 cm/s LV PW:         1.20 cm   LV E/e' medial:  7.4 LV IVS:        1.10 cm   LV e' lateral:   4.82 cm/s LVOT diam:     2.20 cm   LV E/e' lateral: 10.5 LV SV:         65 LV SV Index:   30 LVOT Area:     3.80 cm  RIGHT VENTRICLE RV S prime:     19.70 cm/s TAPSE (M-mode): 2.1 cm LEFT ATRIUM             Index        RIGHT ATRIUM           Index LA Vol (A2C):   29.1 ml 13.55 ml/m  RA Area:     13.50 cm LA Vol (A4C):   41.3 ml 19.23 ml/m  RA Volume:   31.00 ml  14.44 ml/m LA Biplane Vol: 37.6 ml 17.51 ml/m  AORTIC VALVE AV Area (Vmax):    3.60 cm AV Area (Vmean):   3.56 cm AV Area (VTI):     4.01 cm AV Vmax:           93.60 cm/s AV Vmean:          64.200 cm/s AV VTI:            0.163 m AV Peak Grad:      3.5 mmHg AV Mean Grad:      2.0 mmHg LVOT Vmax:         88.60 cm/s LVOT Vmean:        60.100 cm/s  LVOT VTI:          0.172 m LVOT/AV VTI ratio: 1.06  AORTA Ao Root diam: 3.80 cm Ao Asc diam:  4.40 cm MITRAL VALVE MV Area (PHT):  2.91 cm    SHUNTS MV Decel Time: 261 msec    Systemic VTI:  0.17 m MV E velocity: 50.70 cm/s  Systemic Diam: 2.20 cm MV A velocity: 60.00 cm/s MV E/A ratio:  0.85 Lennie Odor MD Electronically signed by Lennie Odor MD Signature Date/Time: 10/22/2022/3:43:42 PM    Final    VAS [place] LOWER EXTREMITY VENOUS (DVT)  Result Date: 10/22/2022  Lower Venous DVT Study Patient Name:  KAPTAIN CRUISE [facility]  Date of Exam:   10/22/2022 Medical Rec #: [id number]         Accession #:    [id number] Date of Birth: 01-May-1966         Patient Gender: M Patient Age:   73 years Exam Location:  [facility] Procedure:      VAS [place] LOWER EXTREMITY VENOUS (DVT) Referring Phys: Scheryl Marten XU --------------------------------------------------------------------------------  Indications: Stroke.  Comparison Study: No previous study. Performing Technologist: McKayla Maag RVT, [place]  Examination Guidelines: A complete evaluation includes B-mode imaging, spectral Doppler, color Doppler, and power Doppler as needed of all accessible portions of each vessel. Bilateral testing is considered an integral part of a complete examination. Limited examinations for reoccurring indications may be performed as noted. The reflux portion of the exam is performed with the patient in reverse Trendelenburg.  +---------+---------------+---------+-----------+----------+--------------+ RIGHT    CompressibilityPhasicitySpontaneityPropertiesThrombus Aging +---------+---------------+---------+-----------+----------+--------------+ CFV      Full           Yes      Yes                                 +---------+---------------+---------+-----------+----------+--------------+ SFJ      Full                                                        +---------+---------------+---------+-----------+----------+--------------+ FV Prox   Full                                                        +---------+---------------+---------+-----------+----------+--------------+ FV Mid   Full                                                        +---------+---------------+---------+-----------+----------+--------------+ FV DistalFull                                                        +---------+---------------+---------+-----------+----------+--------------+ PFV      Full                                                        +---------+---------------+---------+-----------+----------+--------------+ POP  Full           Yes      Yes                                 +---------+---------------+---------+-----------+----------+--------------+ PTV      Full                                                        +---------+---------------+---------+-----------+----------+--------------+ PERO     Full                                                        +---------+---------------+---------+-----------+----------+--------------+   +---------+---------------+---------+-----------+----------+--------------+ LEFT     CompressibilityPhasicitySpontaneityPropertiesThrombus Aging +---------+---------------+---------+-----------+----------+--------------+ CFV      Full           Yes      Yes                                 +---------+---------------+---------+-----------+----------+--------------+ SFJ      Full                                                        +---------+---------------+---------+-----------+----------+--------------+ FV Prox  Full                                                        +---------+---------------+---------+-----------+----------+--------------+ FV Mid   Full                                                        +---------+---------------+---------+-----------+----------+--------------+ FV DistalFull                                                         +---------+---------------+---------+-----------+----------+--------------+ PFV      Full                                                        +---------+---------------+---------+-----------+----------+--------------+ POP      Full           Yes      Yes                                 +---------+---------------+---------+-----------+----------+--------------+  PTV      Full                                                        +---------+---------------+---------+-----------+----------+--------------+ PERO     Full                                                        +---------+---------------+---------+-----------+----------+--------------+     Summary: BILATERAL: - No evidence of deep vein thrombosis seen in the lower extremities, bilaterally. - No evidence of superficial venous thrombosis in the lower extremities, bilaterally. -No evidence of popliteal cyst, bilaterally.   *See table(s) above for measurements and observations.    Preliminary    MR BRAIN WO CONTRAST  Result Date: 10/22/2022 CLINICAL DATA:  Stroke follow-up EXAM: MRI HEAD WITHOUT CONTRAST TECHNIQUE: Multiplanar, multiecho pulse sequences of the brain and surrounding structures were obtained without intravenous contrast. COMPARISON:  CT/CTA head and neck and brain MRI 1 day prior FINDINGS: This is a limited study with axial DWI and axial SWI sequences obtained. Brain: There is diffusion restriction in the right frontal lobe with involvement of the precentral gyrus and pre motor region, corona radiata, and operculum consistent with acute infarcts. The extent of the infarcts appears progressed compared to the MRI from 1 day prior. There is no hemorrhage or mass effect. Parenchymal volume is stable. The ventricles are stable in size. There is no midline shift. IMPRESSION: Limited study with only SWI and DWI sequences obtained. Acute infarcts in the right MCA distribution appear progressed in extent  compared to the study from 1 day prior. No hemorrhage or mass effect. Electronically Signed   By: Lesia Hausen M.D.   On: 10/22/2022 11:38   CT ANGIO HEAD NECK W WO CM (CODE STROKE)  Result Date: 10/21/2022 CLINICAL DATA:  Neuro deficit, acute, stroke suspected EXAM: CT ANGIOGRAPHY HEAD AND NECK WITH AND WITHOUT CONTRAST TECHNIQUE: Multidetector CT imaging of the head and neck was performed using the standard protocol during bolus administration of intravenous contrast. Multiplanar CT image reconstructions and MIPs were obtained to evaluate the vascular anatomy. Carotid stenosis measurements (when applicable) are obtained utilizing NASCET criteria, using the distal internal carotid diameter as the denominator. RADIATION DOSE REDUCTION: This exam was performed according to the departmental dose-optimization program which includes automated exposure control, adjustment of the mA and/or kV according to patient size and/or use of iterative reconstruction technique. CONTRAST:  75mL OMNIPAQUE IOHEXOL 350 MG/ML SOLN COMPARISON:  CT Head 12/18/21 FINDINGS: CT HEAD FINDINGS See same day CT brain for intracranial findings. CTA NECK FINDINGS Aortic arch: Standard branching. Imaged portion shows no evidence of aneurysm or dissection. No significant stenosis of the major arch vessel origins. Right carotid system: No evidence of dissection, stenosis (50% or greater), or occlusion. Left carotid system: No evidence of dissection, stenosis (50% or greater), or occlusion. Vertebral arteries: Codominant. No evidence of dissection, stenosis (50% or greater), or occlusion. Skeleton: Status post C3-C4 ACDF with solid osseous fusion. There is also posterior fusion of the level. Patient is edentulous. Other neck: Negative. Upper chest: Redemonstrated right hilar lymphadenopathy, better assessed on prior CT chest abdomen and pelvis. No  pleural effusion. No pneumothorax. Right chest port in place. Mild circumferential esophageal thickening  Review of the MIP images confirms the above findings CTA HEAD FINDINGS Anterior circulation: No significant stenosis, proximal occlusion, aneurysm, or vascular malformation. Posterior circulation: No significant stenosis, proximal occlusion, aneurysm, or vascular malformation. Venous sinuses: As permitted by contrast timing, patent. Anatomic variants: Fetal PCAs bilaterally. Review of the MIP images confirms the above findings IMPRESSION: 1. No intracranial large vessel occlusion or significant stenosis. 2. No hemodynamically significant stenosis in the neck. 3. Redemonstrated right hilar lymphadenopathy, better assessed on prior CT chest abdomen and pelvis. 4. Mild circumferential esophageal thickening, which can be seen in the setting of esophagitis. Electronically Signed   By: Lorenza Cambridge M.D.   On: 10/21/2022 14:25   MR BRAIN W WO CONTRAST  Result Date: 10/21/2022 CLINICAL DATA:  Transient ischemic attack (TIA) Stroke, follow up Neuro deficit, acute, stroke suspected Metastatic disease evaluation. EXAM: MRI HEAD WITHOUT AND WITH CONTRAST TECHNIQUE: Multiplanar, multiecho pulse sequences of the brain and surrounding structures were obtained without and with intravenous contrast. CONTRAST:  9mL GADAVIST GADOBUTROL 1 MMOL/ML IV SOLN COMPARISON:  Head CT 10/21/2022.  MRI brain 04/09/2020. FINDINGS: Brain: Restricted diffusion in the right precentral gyrus and pre motor area, consistent with acute infarct. No acute hemorrhage or significant mass effect. Underlying mild chronic small-vessel disease. No hydrocephalus. Small bilateral middle cranial fossa arachnoid cysts, right-greater-than-left. No mass or abnormal enhancement. No foci of abnormal susceptibility. Vascular: Normal flow voids and vessel enhancement. Skull and upper cervical spine: Partially visualized susceptibility artifact from posterior spinal fusion hardware. Otherwise, normal marrow signal and enhancement. Sinuses/Orbits: Unremarkable. Other:  None. IMPRESSION: 1. Acute infarcts in the right precentral gyrus and pre motor area. 2. No evidence of intracranial metastatic disease. Electronically Signed   By: Orvan Falconer M.D.   On: 10/21/2022 14:14   CT HEAD CODE STROKE WO CONTRAST  Result Date: 10/21/2022 CLINICAL DATA:  Code stroke. Neuro deficit, acute, stroke suspected left-sided weakness EXAM: CT HEAD WITHOUT CONTRAST TECHNIQUE: Contiguous axial images were obtained from the base of the skull through the vertex without intravenous contrast. RADIATION DOSE REDUCTION: This exam was performed according to the departmental dose-optimization program which includes automated exposure control, adjustment of the mA and/or kV according to patient size and/or use of iterative reconstruction technique. COMPARISON:  CT Head 12/18/21 FINDINGS: Brain: No evidence of acute infarction, hemorrhage, hydrocephalus, extra-axial collection or mass lesion/mass effect. Enlarged and partially empty sella. Vascular: No hyperdense vessel or unexpected calcification. Skull: Normal. Negative for fracture or focal lesion. Sinuses/Orbits: No middle ear or mastoid effusion. Paranasal sinuses clear. Orbits are unremarkable. Other: None. ASPECTS [facility] Stroke Program Early CT Score): 10 IMPRESSION: No hemorrhage or CT evidence of an acute cortical infarct. Aspects 10. Findings were paged to Dr. Derry Lory on 10/21/22 at 1:11 PM via [facility] paging system. Electronically Signed   By: Lorenza Cambridge M.D.   On: 10/21/2022 13:14    Medications: I have reviewed the patient's current medications.  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, biopsy confirmed adenocarcinoma         CTs chest, abdomen, and pelvis on 12/11/2015-indeterminate tiny pulmonary nodules, multiple liver metastases, small nodes in the sigmoid mesocolon Laparoscopic sigmoid colectomy, liver biopsy, Port-A-Cath placement 01/14/2016 Pathology  sigmoid colon resection- colonic adenocarcinoma, 5.3 cm extending into pericolonic connective tissue, positive lymph-vascular invasion, positive perineural invasion, negative margins, metastatic carcinoma in 9 of 28 lymph nodes Pathology liver biopsy-metastatic  colorectal adenocarcinoma MSI stable; mismatch repair protein normal APC alteration, K RAS wild-type, no BRAF mutation Cycle 1 FOLFOX 02/02/2016 Cycle 2 FOLFOX 02/15/2016 Cycle 3 FOLFOX 02/29/2016 Cycle 4 FOLFOX 03/14/2016 Cycle 5 FOLFOX 03/28/2016 Cycle 6 FOLFOX 04/11/2016 (oxaliplatin held secondary to thrombocytopenia) 04/21/2016 restaging CTs-difficulty evaluating liver lesions due to hepatic steatosis. Stable right adrenal nodule. No adenopathy or local recurrence near the rectosigmoid anastomotic site. Cycle 7 FOLFOX 04/25/2016 MRI liver 05/02/2016-partial improvement in hepatic metastases Cycle 8 FOLFOX 05/10/2016 Cycle 9 FOLFOX 05/23/2016 (oxaliplatin held due to thrombocytopenia) Cycle 10 FOLFOX 06/06/2016 (oxaliplatin dose reduced due to thrombocytopenia) Cycle 11 FOLFOX 06/27/2016 (oxaliplatin held due to neuropathy) Cycle 12 FOLFOX 07/11/2016 (oxaliplatin held) Initiation of maintenance Xeloda 7 days on/7 days off 07/27/2016 MRI liver 11/18/2016-decrease in hepatic metastatic disease. No new or progressive disease identified within the abdomen. Continuation of Xeloda 7 days on/7 days off MRI liver 04/27/2017-previous liver lesions not identified, no new lesions, no lymphadenopathy Xeloda continued 7 days on/7 days off MRI liver 12/04/2017 - no evidence of metastatic disease, hepatic steatosis Xeloda continued 7 days on/7 days off MRI liver 07/15/2018- no evidence of metastatic disease.  Stable severe hepatic steatosis. Xeloda continued 7 days on/7 days off MRI liver 03/16/2019-hepatic steatosis, no liver mass, focal area of intrahepatic biliary dilatation in segments 2 and 3 of the left lobe-increased Xeloda continued 7  days on/7 days off MRI abdomen 08/19/2019-no findings to suggest liver metastases.  Bilateral lung nodules measuring up to 2.8 cm, progressive and more conspicuous than on previous exam CT chest 08/29/2019-multiple pulmonary metastases, new from 04/21/2016 Cycle 1 FOLFIRI/bevacizumab 09/09/2019 Cycle 2 FOLFIRI/bevacizumab 09/26/2019  Cycle 3 FOLFIRI/bevacizumab 10/10/2019 Cycle 4 FOLFIRI/bevacizumab 10/24/2019 Cycle 5 FOLFIRI/bevacizumab 11/07/2019 CT chest 11/14/2019-decreased size of lung nodules, no new lesions, hepatic steatosis Cycle 6 FOLFIRI/bevacizumab 11/21/2019 Cycle 7 FOLFIRI/bevacizumab 12/05/2019 Cycle 8 FOLFIRI/bevacizumab 12/19/2019 Cycle 9 FOLFIRI/bevacizumab 01/02/2020 Cycle 10 FOLFIRI/bevacizumab 01/16/2020 CT chest 01/29/2020-stable bilateral pulmonary metastases.  No new or progressive metastatic disease in the chest. Cycle 11 FOLFIRI/bevacizumab 01/30/2020 Cycle 12 FOLFIRI/bevacizumab 02/19/2020 Cycle 13 FOLFIRI/bevacizumab 03/12/2020 Cycle 14 FOLFIRI/bevacizumab 04/02/2020 Cycle 15 FOLFIRI/bevacizumab 04/23/2020 Cycle 16 FOLFIRI/bevacizumab 05/21/2020 CT chest 06/09/2020-mild progression pulmonary metastasis.  Some lesions have increased in size while others are similar. Cycle 1 irinotecan/Panitumumab 06/18/2020 Cycle 2 irinotecan/Panitumumab 07/02/2020 Cycle 3 irinotecan/panitumumab 07/16/2020 Cycle 4 irinotecan/Panitumumab 07/30/2020 Cycle 5 irinotecan/Panitumumab 08/13/2020, Emend added Cycle 6 irinotecan/Panitumumab 08/27/2020 CT chest 09/08/2020-decreased size of pulmonary nodules, no progressive disease Cycle 7 irinotecan/panitumumab 09/02/2020 Cycle 8 irinotecan/panitumumab 09/24/2020 Cycle 9 irinotecan/Panitumumab 10/08/2020 10/22/2020 treatment held due to left foot fracture, need for surgery Cycle 10 irinotecan/panitumumab 11/24/2020 Cycle 11 irinotecan/Panitumumab 12/08/2020 Cycle 12 irinotecan/panitumumab 12/22/2020 Cycle 13 irinotecan/panitumumab 01/05/2021 01/20/2021 CT chest-mixed response  with minimal increase in size of some lesions and minimal decrease in the size of other lesions.  Overall number of lesions is unchanged. Cycle 14 irinotecan/Panitumumab 02/05/2021 Cycle 15 irinotecan/Panitumumab 02/23/2021 Cycle 16 irinotecan/panitumumab 03/16/2021 Cycle 17 irinotecan/Panitumumab 04/13/2021 05/03/2021-CT chest-enlargement of pulmonary metastases, no new lesions 06/21/2021-cycle 1 Lonsurf/bevacizumab 07/19/2021-cycle 2 Lonsurf/bevacizumab 08/16/2021-cycle 3 Lonsurf/bevacizumab 09/09/2021 CT chest-bilateral lung nodules and masses with mixed response, overall stable to very minimally increased 09/13/2021 cycle 4 Lonsurf/bevacizumab 10/12/2021 cycle 5 Lonsurf/bevacizumab 10/21/2021 chest CT-bilateral pulmonary metastases without significant change.  No new or progressive disease within the chest. 11/08/2021 cycle 6 Lonsurf/bevacizumab 12/06/2021 cycle 7 Lonsurf/bevacizumab 01/03/2022 cycle 8 Lonsurf/bevacizumab CTs 01/25/2022-index lung lesion stable to mildly increased in size.  Mild retroperitoneal adenopathy, mildly increased in size compared to 12/16/2020. 02/01/2022 cycle 9 Lonsurf/bevacizumab 02/28/2022 cycle 10  Lonsurf/bevacizumab 03/28/2022 cycle 11 Lonsurf/bevacizumab CTs 04/21/2022-no change in bilateral pulmonary metastases, 2 new hypoattenuating liver lesions, new enlarged portacaval node Cycle 1 fruquintinib 05/24/2022 Cycle 2 fruquintinib 06/21/2022 Cycle 3 fruquintinib 07/19/2022 CTs 08/12/2022-stable lung nodules, hilar and retroperitoneal nodes, slight increase in size of liver metastases, stable right adrenal nodule Cycle 4 fruquintinib 08/16/2022 Cycle 5 fruquintinib 09/13/2022 Cycle 6 fruquintinib 10/11/2022 CTs 10/22/2022-progressive mediastinal, pericardial, and periportal adenopathy, enlargement of liver lesions, stable pulmonary nodules   2.   Rectal bleeding and constipation secondary to #1   3.   History of peripheral vascular disease, status post left lower extremity vascular  bypass surgery in April 2017   4.   History of nephrolithiasis   5.   History of Graves' disease treated with radioactive iodine   6.   Anxiety/depression   7.   Hypertension   8.   Hospitalization 01/19/2016 with wound dehiscence status post secondary suture closure of abdominal wall   9.   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 Hyperglycemia 06/20/2016-diagnosed with diabetes, maintained on insulin   11.  Positive COVID test 12/13/2018; positive COVID test 01/25/2021   12. Hospitalized with seizure activity/DKA. Now on Keppra, insulin adjusted. 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  Left foot fracture-surgical repair 11/11/2020 14.  Admission 10/21/2022 with an acute CVA 10/21/2022-MRI brain-acute infarct in the right precentral gyrus and premotor area, negative for brain metastases CT angiogram head/neck 10/21/2022-no large vessel occlusion or stenosis TNK 10/21/2022  Mr. Mccance was admitted with a right brain CVA.  Left hand weakness appears partially improved this morning.  He underwent restaging CTs yesterday.  I reviewed the CT images.  I discussed the findings with Dr. Burgess Estelle, and with his wife by telephone.  The CTs are consistent with progressive disease involving chest lymph nodes and the liver.  I recommend discontinuing fruquintinib based on the CT findings and CVA.  He will return for a follow-up visit as scheduled on 11/07/2022.  We will discuss salvage treatment options, most likely FOLFOX or CAPOX.    Recommendations: Hold fruquintinib today Aspirin/Plavix per neurology Hypertension management per neurology Outpatient follow-up as scheduled at the Cancer center       LOS: 2 days   Thornton Papas,  MD   10/23/2022, 6:38 AM

## 2022-10-24 ENCOUNTER — Other Ambulatory Visit (HOSPITAL_COMMUNITY): Payer: Self-pay

## 2022-10-24 DIAGNOSIS — I639 Cerebral infarction, unspecified: Secondary | ICD-10-CM | POA: Diagnosis not present

## 2022-10-24 LAB — BASIC METABOLIC PANEL
Anion gap: 13 (ref 5–15)
BUN: 7 mg/dL (ref 6–20)
CO2: 24 mmol/L (ref 22–32)
Calcium: 9.1 mg/dL (ref 8.9–10.3)
Chloride: 98 mmol/L (ref 98–111)
Creatinine, Ser: 0.94 mg/dL (ref 0.61–1.24)
GFR, Estimated: >60 mL/min (ref >60)
Glucose, Bld: 103 mg/dL — ABNORMAL HIGH (ref 70–99)
Potassium: 4.1 mmol/L (ref 3.5–5.1)
Sodium: 135 mmol/L (ref 135–145)

## 2022-10-24 LAB — CBC
HCT: 40.1 % (ref 39.0–52.0)
Hemoglobin: 12.4 g/dL — ABNORMAL LOW (ref 13.0–17.0)
MCH: 27.4 pg (ref 26.0–34.0)
MCHC: 30.9 g/dL (ref 30.0–36.0)
MCV: 88.5 fL (ref 80.0–100.0)
Platelets: 125 10*3/uL — ABNORMAL LOW (ref 150–400)
RBC: 4.53 MIL/uL (ref 4.22–5.81)
RDW: 15.4 % (ref 11.5–15.5)
WBC: 8.3 10*3/uL (ref 4.0–10.5)
nRBC: 0 % (ref 0.0–0.2)

## 2022-10-24 LAB — TSH: TSH: 0.468 u[IU]/mL (ref 0.350–4.500)

## 2022-10-24 LAB — GLUCOSE, CAPILLARY
Glucose-Capillary: 129 mg/dL — ABNORMAL HIGH (ref 70–99)
Glucose-Capillary: 145 mg/dL — ABNORMAL HIGH (ref 70–99)
Glucose-Capillary: 95 mg/dL (ref 70–99)
Glucose-Capillary: 122 mg/dL — ABNORMAL HIGH (ref 70–99)

## 2022-10-24 LAB — T4, FREE: Free T4: 0.97 ng/dL (ref 0.61–1.12)

## 2022-10-24 LAB — TROPONIN I (HIGH SENSITIVITY): Troponin I (High Sensitivity): 5 ng/L (ref <18)

## 2022-10-24 MED ORDER — METOPROLOL TARTRATE 50 MG PO TABS
50.0000 mg | ORAL_TABLET | Freq: Once | ORAL | Status: AC
  Administered 2022-10-24: 50 mg via ORAL
  Filled 2022-10-24: qty 1

## 2022-10-24 MED ORDER — METOPROLOL TARTRATE 50 MG PO TABS
150.0000 mg | ORAL_TABLET | Freq: Two times a day (BID) | ORAL | Status: DC
  Administered 2022-10-24 – 2022-10-25 (×2): 150 mg via ORAL
  Filled 2022-10-24 (×2): qty 3

## 2022-10-24 NOTE — Discharge Instructions (Signed)
Information on my medicine - ELIQUIS® (apixaban) ° °This medication education was reviewed with me or my healthcare representative as part of my discharge preparation.   ° ° °Why was Eliquis® prescribed for you? °Eliquis® was prescribed for you to reduce the risk of a blood clot forming that can cause a stroke. ° °What do You need to know about Eliquis® ? °Take your Eliquis® TWICE DAILY - one tablet in the morning and one tablet in the evening with or without food. If you have difficulty swallowing the tablet whole please discuss with your pharmacist how to take the medication safely. ° °Take Eliquis® exactly as prescribed by your doctor and DO NOT stop taking Eliquis® without talking to the doctor who prescribed the medication.  Stopping may increase your risk of developing a stroke.  Refill your prescription before you run out. ° °After discharge, you should have regular check-up appointments with your healthcare provider that is prescribing your Eliquis®.  In the future your dose may need to be changed if your kidney function or weight changes by a significant amount or as you get older. ° °What do you do if you miss a dose? °If you miss a dose, take it as soon as you remember on the same day and resume taking twice daily.  Do not take more than one dose of ELIQUIS at the same time to make up a missed dose. ° °Important Safety Information °A possible side effect of Eliquis® is bleeding. You should call your healthcare provider right away if you experience any of the following: °Bleeding from an injury or your nose that does not stop. °Unusual colored urine (red or dark brown) or unusual colored stools (red or black). °Unusual bruising for unknown reasons. °A serious fall or if you hit your head (even if there is no bleeding). ° °Some medicines may interact with Eliquis® and might increase your risk of bleeding or clotting while on Eliquis®. To help avoid this, consult your healthcare provider or pharmacist prior  to using any new prescription or non-prescription medications, including herbals, vitamins, non-steroidal anti-inflammatory drugs (NSAIDs) and supplements. ° °This website has more information on Eliquis® (apixaban): http://www.eliquis.com/eliquis/home  °

## 2022-10-24 NOTE — TOC Benefit Eligibility Note (Signed)
Patient Product/process development scientist completed.    The patient is currently admitted and upon discharge could be taking Eliquis 5 mg.  The current 30 day co-pay is $4.00.   The patient is insured through [employer]   This test claim was processed through [facility]- copay amounts may vary at other pharmacies due to pharmacy/plan contracts, or as the patient moves through the different stages of their insurance plan.  Roland Earl, CPHT Pharmacy Patient Advocate Specialist [facility] Patient Advocate Team Direct Number: [phone]  Fax: [phone]

## 2022-10-24 NOTE — TOC Initial Note (Signed)
Transition of Care [facility]) - Initial/Assessment Note    Patient Details  Name: Jeff [name] MRN: [id number] Date of Birth: Oct 16, 1965  Transition of Care [facility]) CM/SW Contact:    Kermit Balo, RN Phone Number: 10/24/2022, 4:14 PM  Clinical Narrative:                 Patient is from home with his spouse. Spouse works but can provide intermittent supervision. No DME at home.  Pt admits to not always being compliant with home medications. Pt states he plans on being compliant after this hospitalization. Pt denies issues with transportation. Outpatient therapy arranged at [facility]. Information on the AVS. Pt will need to call and schedule the first appointment. TOC following.  Expected Discharge Plan: OP Rehab Barriers to Discharge: Continued Medical Work up   Patient Goals and CMS Choice   CMS Medicare.gov Compare Post Acute Care list provided to:: Patient Choice offered to / list presented to : Patient, Spouse      Expected Discharge Plan and Services   Discharge Planning Services: CM Consult   Living arrangements for the past 2 months: Apartment                                      Prior Living Arrangements/Services Living arrangements for the past 2 months: Apartment Lives with:: Spouse Patient language and need for interpreter reviewed:: Yes Do you feel safe going back to the place where you live?: Yes        Care giver support system in place?: Yes (comment)   Criminal Activity/Legal Involvement Pertinent to Current Situation/Hospitalization: No - Comment as needed  Activities of Daily Living Home Assistive Devices/Equipment: None ADL Screening (condition at time of admission) Patient's cognitive ability adequate to safely complete daily activities?: Yes Is the patient deaf or have difficulty hearing?: No Does the patient have difficulty seeing, even when wearing glasses/contacts?: No Does the patient have difficulty concentrating,  remembering, or making decisions?: No Patient able to express need for assistance with ADLs?: Yes Does the patient have difficulty dressing or bathing?: No Independently performs ADLs?: Yes (appropriate for developmental age) Does the patient have difficulty walking or climbing stairs?: No Weakness of Legs: None Weakness of Arms/Hands: None  Permission Sought/Granted                  Emotional Assessment Appearance:: Appears stated age Attitude/Demeanor/Rapport: Engaged Affect (typically observed): Accepting Orientation: : Oriented to Self, Oriented to Place, Oriented to Situation   Psych Involvement: No (comment)  Admission diagnosis:  Ischemic stroke [facility]) [I63.9] Patient Active Problem List   Diagnosis Date Noted   Ischemic stroke (HCC) 10/21/2022   Daytime sleepiness 08/03/2021   OSA (obstructive sleep apnea) 08/03/2021   Body mass index (BMI) 35.0-35.9, adult 01/04/2021   Adhesive capsulitis of left shoulder 06/09/2020   Body mass index (BMI) 25.0-25.9, adult 11/27/2019   DKA (diabetic ketoacidoses) 10/25/2019   Seizure (HCC) 10/25/2019   Goals of care, counseling/discussion 09/03/2019   Thoracic spine pain 03/19/2019   Diabetes (HCC) 08/10/2016   Genetic testing 04/20/2016   Port catheter in place 03/14/2016   SBO (small bowel obstruction) (HCC) 01/19/2016   Incisional hernia 01/19/2016   Metastatic colon cancer to liver (HCC) 01/14/2016   Cancer of sigmoid colon (HCC) 12/18/2015   Aftercare following surgery of the circulatory system 09/23/2015   Therapeutic opioid induced constipation 09/23/2015  Left leg claudication (HCC) 09/11/2015   Cervical post-laminectomy syndrome 08/31/2015   Cervical radiculopathy 04/07/2015   Ureterolithiasis 01/04/2015   ED (erectile dysfunction) 12/09/2014   Spasm 07/03/2014   Hypothyroidism following radioiodine therapy 05/26/2014   PAD (peripheral artery disease) (HCC) 02/11/2014   Lumbar facet arthropathy 07/15/2013    Degeneration of lumbar intervertebral disc 06/20/2013   Backache 05/21/2013   Lumbosacral radiculitis 05/21/2013   Chest pain, mid sternal 06/12/2012   Acute non-ST segment elevation myocardial infarction [facility]) 06/11/2012   Hyperlipidemia 06/11/2012   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   Hypertension 03/16/2010   PCP:  Simone Curia, MD Pharmacy:   [facility] [address], [place] - [address] [place] [address] Phone: [phone] Fax: [phone]  [id number] - [place], [place] - [address] AT [facility] [address] [place] [address] Phone: [phone] Fax: [phone]  [facility] - [place], [place] - [address] [place] [address] Phone: [phone] Fax: [phone]  [id number] - [place], [place] - [address] AT [facility] [address] & [address] [place] [address] Phone: [phone] Fax: [phone]  [facility] [address] [place] [address] Phone: [phone] Fax: [phone]     Social Determinants of Health (SDOH) Social History: SDOH Screenings   Food Insecurity: No Food Insecurity (10/22/2022)  Housing: Low Risk  (10/22/2022)  Transportation Needs: No Transportation Needs (10/22/2022)  Utilities: Not At Risk (10/22/2022)  Tobacco Use: Low Risk  (10/21/2022)   SDOH Interventions:     Readmission Risk Interventions     No data to display

## 2022-10-24 NOTE — Progress Notes (Signed)
Physical Therapy Treatment Patient Details Name: Jeff [name] MRN: [id number] DOB: July 05, 1965 Today's Date: 10/24/2022   History of Present Illness 57 yo male presenting via EMS 6/7 with L hemiparesis and L facial droop. 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 TNK given 6/7 at 1332. PMH includes: seizures, NSTEMI, metastatic colon CA, DM II, PAD, and hypothroidism.    PT Comments    Pt is demonstrating some mild gait deviations this date, needing cues to increase bil stride length, L stance time, and L foot clearance with noted good success. Focused majority of session on facilitating L lower extremity and upper extremity muscle activation while challenging his standing balance. Pt was challenged by standing and stepping up/down onto/off of an unsteady surface and reaching off BOS while maintaining narrow feet stances while on even surfaces. Pt required min-modA to regain balance during various balance challenges and exercises. He would benefit from neuro OPPT upon d/c. Will continue to follow acutely.     Recommendations for follow up therapy are one component of a multi-disciplinary discharge planning process, led by the attending physician.  Recommendations may be updated based on patient status, additional functional criteria and insurance authorization.  Follow Up Recommendations       Assistance Recommended at Discharge Frequent or constant Supervision/Assistance  Patient can return home with the following A little help with walking and/or transfers;Help with stairs or ramp for entrance;Assistance with cooking/housework;Assist for transportation;A little help with bathing/dressing/bathroom   Equipment Recommendations  None recommended by PT    Recommendations for Other Services       Precautions / Restrictions Precautions Precautions: Fall (low risk) Restrictions Weight Bearing Restrictions: No     Mobility  Bed Mobility Overal bed  mobility: Modified Independent Bed Mobility: Supine to Sit     Supine to sit: Modified independent (Device/Increase time), HOB elevated     General bed mobility comments: Able to come to sit R EOB with HOB elevated without assistance.    Transfers Overall transfer level: Needs assistance Equipment used: None Transfers: Sit to/from Stand Sit to Stand: Supervision, Min assist           General transfer comment: Able to come to stand from EOB 1x and from chair in therapy gym 1x without LOB, supervision for safety. MinA to power up to stand with x10 serial sit <> stand reps from chair with no R UE support but L UE on armrest and R foot anterior to L in placement.    Ambulation/Gait Ambulation/Gait assistance: Min guard Gait Distance (Feet): 90 Feet (x2 bouts of ~90 ft each bout) Assistive device: None Gait Pattern/deviations: Step-through pattern, Shuffle, Decreased step length - right, Decreased stance time - left, Decreased dorsiflexion - left Gait velocity: reduced Gait velocity interpretation: 1.31 - 2.62 ft/sec, indicative of limited community ambulator   General Gait Details: Pt with more shuffled gait pattern today with intermittent decreased L foot clearance, hitting his forefoot prematurely during L swing phase, and intermittent decreased L stance time and thus R step length. Cues provided to slow gait to focus on stability and increasing stride length and feet clearance, good success noted but exaggerated corrections. Min guard for safety, no LOB.   Stairs Stairs: Yes Stairs assistance: Min guard Stair Management: Forwards, One rail Left, Step to pattern, Backwards Number of Stairs: 10 General stair comments: Ascends forward with L foot and descends backwards with R foot repeatedly on first step to strengthen his L leg, neeidng  cues to keep L hand on rail with it intermittently falling off the rail, min guard for safety   Wheelchair Mobility    Modified Rankin  (Stroke Patients Only) Modified Rankin (Stroke Patients Only) Pre-Morbid Rankin Score: No symptoms Modified Rankin: Moderately severe disability     Balance Overall balance assessment: Needs assistance, No apparent balance deficits (not formally assessed) Sitting-balance support: No upper extremity supported, Feet supported Sitting balance-Leahy Scale: Good     Standing balance support: No upper extremity supported Standing balance-Leahy Scale: Good Standing balance comment: Able to ambulate without UE support and withstand some dynamic gait challenges, but needs Min-modA for balance for reaching to floor with L UE             High level balance activites: Other (comment) High Level Balance Comments: Pt stood on blue balance disc with varying stance widths without UE support for >4 min duration overall, needing UE support to regain balance when he would have LOB every ~10-30 sec. Forward step-ups and backwards step-downs on/off the blue disc with L UE support, 10x each foot, min-modA for balance when stepping up on or back off with R foot, min guard-minA with L foot. Progressed quickly from standard stance > Rhomberg stance > semi-tandem stance > tandem stance on even surface while catching and throwing back an inflated ball, challenging with tandem stance either leg needing intermittent minA to recover LOB, 1x ~1 min each leg.            Cognition Arousal/Alertness: Awake/alert Behavior During Therapy: WFL for tasks assessed/performed Overall Cognitive Status: Within Functional Limits for tasks assessed                                          Exercises Other Exercises Other Exercises: Pt stood on blue balance disc with varying stance widths without UE support for >4 min duration overall, needing UE support to regain balance when he would have LOB every ~10-30 sec. Other Exercises: Ascends forward with L foot and descends backwards with R foot repeatedly on  first stair to strengthen his L leg Other Exercises: x10 serial sit <> stand reps from chair with no R UE support but L UE on armrest and R foot placed anterior to L, minA Other Exercises: Forward step-ups and backwards step-downs on/off the blue disc with L UE support, 10x each foot, min-modA for balance when stepping up on or back off with R foot, min guard-minA with L foot. Other Exercises: Progressed quickly from standard stance > Rhomberg stance > semi-tandem stance > tandem stance on even surface while catching and throwing back an inflated ball, challenging with tandem stance either leg needing intermittent minA to recover LOB, 1x ~1 min each leg. Then had pt reach off BOS forward and to L to pick ball up off ground with feet slightly apart, min-modA for balance when cued to only use L UE to pick up ball, x4 reps.    General Comments General comments (skin integrity, edema, etc.): pt progressed and walked the unit x2 with supervision      Pertinent Vitals/Pain Pain Assessment Pain Assessment: Faces Faces Pain Scale: No hurt Pain Intervention(s): Monitored during session    Home Living                          Prior Function  PT Goals (current goals can now be found in the care plan section) Acute Rehab PT Goals Patient Stated Goal: reutrn home PT Goal Formulation: With patient/family Time For Goal Achievement: 11/05/22 Potential to Achieve Goals: Good Progress towards PT goals: Progressing toward goals    Frequency    Min 4X/week      PT Plan Current plan remains appropriate    Co-evaluation              AM-PAC PT "6 Clicks" Mobility   Outcome Measure  Help needed turning from your back to your side while in a flat bed without using bedrails?: None Help needed moving from lying on your back to sitting on the side of a flat bed without using bedrails?: None Help needed moving to and from a bed to a chair (including a wheelchair)?: A  Little Help needed standing up from a chair using your arms (e.g., wheelchair or bedside chair)?: A Little Help needed to walk in hospital room?: A Little Help needed climbing 3-5 steps with a railing? : A Little 6 Click Score: 20    End of Session Equipment Utilized During Treatment: Gait belt Activity Tolerance: Patient tolerated treatment well Patient left: with call bell/phone within reach;with family/visitor present;in bed   PT Visit Diagnosis: Unsteadiness on feet (R26.81);Other abnormalities of gait and mobility (R26.89);Muscle weakness (generalized) (M62.81);Other symptoms and signs involving the nervous system (R29.898);Difficulty in walking, not elsewhere classified (R26.2)     Time: 1610-9604 PT Time Calculation (min) (ACUTE ONLY): 24 min  Charges:  $Gait Training: 8-22 mins $Neuromuscular Re-education: 8-22 mins                     Raymond Gurney, PT, DPT Acute Rehabilitation Services  Office: [phone]    Jewel Baize 10/24/2022, 5:07 PM

## 2022-10-24 NOTE — Progress Notes (Addendum)
STROKE TEAM PROGRESS NOTE   SUBJECTIVE (INTERVAL HISTORY) His wife is at the bedside. Pt sitting in chair, still has tachycardia 110s to 120s.  However, overnight he is heart rate 90s.  On metoprolol 100 twice daily, discussed his pharmacist, will increase to 150 twice daily.  On Eliquis, tolerating well.   OBJECTIVE Temp:  [97.6 F (36.4 C)-98.9 F (37.2 C)] 97.7 F (36.5 C) (06/10 1357) Pulse Rate:  [87-130] 99 (06/10 1357) Cardiac Rhythm: Normal sinus rhythm (06/10 1349) Resp:  [12-32] 18 (06/10 1357) BP: (99-186)/(70-117) 109/83 (06/10 1357) SpO2:  [88 %-100 %] 100 % (06/10 1357)  Recent Labs  Lab 10/23/22 1557 10/23/22 2125 10/24/22 0721 10/24/22 1127 10/24/22 1701  GLUCAP 119* 160* 129* 145* 95   Recent Labs  Lab 10/21/22 1304 10/21/22 1305 10/21/22 1305 10/22/22 0151 10/23/22 0234 10/24/22 0318  NA 139 136  --  137 136 135  K 4.0 4.1  --  3.8 3.7 4.1  CL 99 102  --  100 100 98  CO2  --  25  --  25 24 24   GLUCOSE 163* 164*  --  121* 111* 103*  BUN 3* 5*  --  6 7 7   CREATININE 1.00 0.99  --  1.09 1.00 0.94  CALCIUM  --  8.5*   < > 8.2* 8.8* 9.1   < > = values in this interval not displayed.   Recent Labs  Lab 10/21/22 1305 10/22/22 0151  AST 51* 43*  ALT 46* 42  ALKPHOS 75 74  BILITOT 0.9 0.9  PROT 6.6 6.9  ALBUMIN 3.3* 3.3*   Recent Labs  Lab 10/21/22 1304 10/21/22 1305 10/22/22 0151 10/23/22 0234 10/24/22 0318  WBC  --  8.0 7.4 6.5 8.3  NEUTROABS  --  4.6  --   --   --   HGB 14.3 13.3 13.0 12.3* 12.4*  HCT 42.0 42.3 40.4 39.6 40.1  MCV  --  90.4 88.0 89.4 88.5  PLT  --  138* 123* 114* 125*   No results for input(s): "CKTOTAL", "CKMB", "CKMBINDEX", "TROPONINI" in the last 168 hours. No results for input(s): "LABPROT", "INR" in the last 72 hours.  No results for input(s): "COLORURINE", "LABSPEC", "PHURINE", "GLUCOSEU", "HGBUR", "BILIRUBINUR", "KETONESUR", "PROTEINUR", "UROBILINOGEN", "NITRITE", "LEUKOCYTESUR" in the last 72  hours.  Invalid input(s): "APPERANCEUR"      Component Value Date/Time   CHOL 136 10/21/2022 2121   CHOL 139 04/16/2019 0842   TRIG 184 (H) 10/21/2022 2121   HDL 31 (L) 10/21/2022 2121   HDL 36 (L) 04/16/2019 0842   CHOLHDL 4.4 10/21/2022 2121   VLDL 37 10/21/2022 2121   LDLCALC 68 10/21/2022 2121   LDLCALC 68 04/16/2019 0842   Lab Results  Component Value Date   HGBA1C 7.5 (H) 10/21/2022      Component Value Date/Time   LABOPIA NONE DETECTED 10/21/2022 1300   COCAINSCRNUR NONE DETECTED 10/21/2022 1300   LABBENZ NONE DETECTED 10/21/2022 1300   AMPHETMU NONE DETECTED 10/21/2022 1300   THCU NONE DETECTED 10/21/2022 1300   LABBARB NONE DETECTED 10/21/2022 1300    Recent Labs  Lab 10/21/22 1306  ETH <10    I have personally reviewed the radiological images below and agree with the radiology interpretations.  VAS [place] LOWER EXTREMITY VENOUS (DVT)  Result Date: 10/23/2022  Lower Venous DVT Study Patient Name:  [name]  Date of Exam:   10/22/2022 Medical Rec #: [id number]         Accession #:  [id number] Date of Birth: 1965-10-10         Patient Gender: M Patient Age:   57 years Exam Location:  [facility] Procedure:      VAS [place] LOWER EXTREMITY VENOUS (DVT) Referring Phys: Scheryl Marten Roshanda Balazs --------------------------------------------------------------------------------  Indications: Stroke.  Comparison Study: No previous study. Performing Technologist: McKayla Maag RVT, [place]  Examination Guidelines: A complete evaluation includes B-mode imaging, spectral Doppler, color Doppler, and power Doppler as needed of all accessible portions of each vessel. Bilateral testing is considered an integral part of a complete examination. Limited examinations for reoccurring indications may be performed as noted. The reflux portion of the exam is performed with the patient in reverse Trendelenburg.  +---------+---------------+---------+-----------+----------+--------------+ RIGHT     CompressibilityPhasicitySpontaneityPropertiesThrombus Aging +---------+---------------+---------+-----------+----------+--------------+ CFV      Full           Yes      Yes                                 +---------+---------------+---------+-----------+----------+--------------+ SFJ      Full                                                        +---------+---------------+---------+-----------+----------+--------------+ FV Prox  Full                                                        +---------+---------------+---------+-----------+----------+--------------+ FV Mid   Full                                                        +---------+---------------+---------+-----------+----------+--------------+ FV DistalFull                                                        +---------+---------------+---------+-----------+----------+--------------+ PFV      Full                                                        +---------+---------------+---------+-----------+----------+--------------+ POP      Full           Yes      Yes                                 +---------+---------------+---------+-----------+----------+--------------+ PTV      Full                                                        +---------+---------------+---------+-----------+----------+--------------+  PERO     Full                                                        +---------+---------------+---------+-----------+----------+--------------+   +---------+---------------+---------+-----------+----------+--------------+ LEFT     CompressibilityPhasicitySpontaneityPropertiesThrombus Aging +---------+---------------+---------+-----------+----------+--------------+ CFV      Full           Yes      Yes                                 +---------+---------------+---------+-----------+----------+--------------+ SFJ      Full                                                         +---------+---------------+---------+-----------+----------+--------------+ FV Prox  Full                                                        +---------+---------------+---------+-----------+----------+--------------+ FV Mid   Full                                                        +---------+---------------+---------+-----------+----------+--------------+ FV DistalFull                                                        +---------+---------------+---------+-----------+----------+--------------+ PFV      Full                                                        +---------+---------------+---------+-----------+----------+--------------+ POP      Full           Yes      Yes                                 +---------+---------------+---------+-----------+----------+--------------+ PTV      Full                                                        +---------+---------------+---------+-----------+----------+--------------+ PERO     Full                                                        +---------+---------------+---------+-----------+----------+--------------+  Summary: BILATERAL: - No evidence of deep vein thrombosis seen in the lower extremities, bilaterally. - No evidence of superficial venous thrombosis in the lower extremities, bilaterally. -No evidence of popliteal cyst, bilaterally.   *See table(s) above for measurements and observations. Electronically signed by Gerarda Fraction on 10/23/2022 at 3:55:06 PM.    Final    CT CHEST ABDOMEN PELVIS W CONTRAST  Result Date: 10/22/2022 CLINICAL DATA:  Metastatic colon cancer, restaging examination. *Tracking code: [id number]* EXAM: CT CHEST, ABDOMEN, AND PELVIS WITH CONTRAST TECHNIQUE: Multidetector CT imaging of the chest, abdomen and pelvis was performed following the standard protocol during bolus administration of intravenous contrast. RADIATION DOSE REDUCTION: This exam was performed  according to the departmental dose-optimization program which includes automated exposure control, adjustment of the mA and/or kV according to patient size and/or use of iterative reconstruction technique. CONTRAST:  75mL OMNIPAQUE IOHEXOL 350 MG/ML SOLN COMPARISON:  08/11/2022 FINDINGS: CT CHEST FINDINGS Cardiovascular: Cardiac size within normal limits. Mild coronary artery calcification. No pericardial effusion. Central pulmonary arteries are of normal caliber. Mild atherosclerotic calcification within the thoracic aorta. No aortic aneurysm. Right internal jugular chest port tip noted within the superior vena cava. Mediastinum/Nodes: Pathologic right hilar adenopathy is stable with the index lymph node measuring 1.2 x 1.8 cm at axial image # 26/3. Progressive right paraesophageal adenopathy with the index lymph node measuring 14 mm in short axis diameter at axial image # 32/3, previously measuring 7 mm. New pericardial adenopathy with the index lymph node measuring 2.1 x 3.5 cm at axial image # 42/3, new since prior exam. Thyroid is atrophic. Esophagus is unremarkable. Lungs/Pleura: Numerous multilobulated pulmonary metastases are stable in size and number. Index lesion within the right middle lobe measures 2.8 x 3.6 cm at axial image # 81/4. Index lesion within the lingula measures 3.1 x 4.1 cm at axial image # 82/4. New trace right pleural effusion. No pneumothorax. No central obstructing lesion. Musculoskeletal: No focal lytic or blastic bone lesion. CT ABDOMEN PELVIS FINDINGS Hepatobiliary: Numerous hypoenhancing metastatic foci are seen within the liver bilaterally, progressive since prior examination. Index lesion within the left hepatic lobe, axial image # 48/3, measures 2.1 x 2.4 cm. Index lesion within the right hepatic lobe at axial image # 50/3 measures 1.9 x 2.0 cm. Mild hepatic steatosis. Cholelithiasis noted without pericholecystic inflammatory change. No intra or extrahepatic biliary ductal  dilation. Pancreas: Unremarkable Spleen: Unremarkable Adrenals/Urinary Tract: The adrenal glands are unremarkable. The kidneys are normal in size and position. 4 mm nonobstructing calculus again noted within the interpolar region of the left kidney. No hydronephrosis. No ureteral calculi. The bladder is unremarkable. Stomach/Bowel: Status post sigmoid colectomy. The stomach, small bowel, and large bowel are otherwise unremarkable. Appendix normal. No free intraperitoneal gas or fluid. Vascular/Lymphatic: Mild aortoiliac atherosclerotic calcification. No aortic aneurysm. Pathologic periportal adenopathy has progressed with the index lymph node measuring 1.8 x 3.1 cm at axial image # 63/3. Left periaortic adenopathy appears stable measuring 1.4 x 2.3 cm at axial image # 89/3. Reproductive: Moderate prostatic hypertrophy. Other: No abdominal wall hernia. Musculoskeletal: L4-S1 lumbar fusion with instrumentation and L5 posterior decompression has been performed. No acute bone abnormality. No lytic or blastic bone lesion. IMPRESSION: 1. Progressive disease with progressive mediastinal, pericardial, and periportal adenopathy as well as progressive hepatic metastatic disease. Stable pulmonary metastatic disease. 2. Mild coronary artery calcification. 3. Cholelithiasis. 4. Mild left nonobstructing nephrolithiasis. 5. Moderate prostatic hypertrophy. 6. Status post sigmoid colectomy. Aortic Atherosclerosis (ICD10-I70.0). Electronically Signed   By: Gloris Ham  Ramiro Harvest M.D.   On: 10/22/2022 23:42   ECHOCARDIOGRAM COMPLETE  Result Date: 10/22/2022    ECHOCARDIOGRAM REPORT   Patient Name:   DAEGEN [facility] Date of Exam: 10/22/2022 Medical Rec #:  [id number]        Height:       70.0 in Accession #:    [id number]       Weight:       213.8 lb Date of Birth:  [birth year]        BSA:          2.148 m Patient Age:    56 years         BP:           150/100 mmHg Patient Gender: M                HR:           97 bpm. Exam Location:   Inpatient Procedure: 2D Echo, Cardiac Doppler and Color Doppler Indications:    Stroke  History:        Patient has no prior history of Echocardiogram examinations.                 Stroke and PAD; Risk Factors:Hypertension, Sleep Apnea and                 Diabetes.  Sonographer:    Lucy Antigua Referring Phys: [id number] Lynnae January IMPRESSIONS  1. Left ventricular ejection fraction, by estimation, is 60 to 65%. The left ventricle has normal function. The left ventricle has no regional wall motion abnormalities. There is mild concentric left ventricular hypertrophy. Left ventricular diastolic parameters are consistent with Grade I diastolic dysfunction (impaired relaxation).  2. Right ventricular systolic function is normal. The right ventricular size is normal. Tricuspid regurgitation signal is inadequate for assessing PA pressure.  3. The mitral valve is grossly normal. Trivial mitral valve regurgitation. No evidence of mitral stenosis.  4. The aortic valve is tricuspid. Aortic valve regurgitation is not visualized. No aortic stenosis is present.  5. Ascending aorta not well seen. Measurments likely inaccurate. Recent CT with normal aortic measurements.  6. The inferior vena cava is normal in size with greater than 50% respiratory variability, suggesting right atrial pressure of 3 mmHg. Conclusion(s)/Recommendation(s): No intracardiac source of embolism detected on this transthoracic study. Consider a transesophageal echocardiogram to exclude cardiac source of embolism if clinically indicated. FINDINGS  Left Ventricle: Left ventricular ejection fraction, by estimation, is 60 to 65%. The left ventricle has normal function. The left ventricle has no regional wall motion abnormalities. The left ventricular internal cavity size was normal in size. There is  mild concentric left ventricular hypertrophy. Left ventricular diastolic parameters are consistent with Grade I diastolic dysfunction (impaired relaxation). Right  Ventricle: The right ventricular size is normal. No increase in right ventricular wall thickness. Right ventricular systolic function is normal. Tricuspid regurgitation signal is inadequate for assessing PA pressure. Left Atrium: Left atrial size was normal in size. Right Atrium: Right atrial size was normal in size. Pericardium: Trivial pericardial effusion is present. Mitral Valve: The mitral valve is grossly normal. Trivial mitral valve regurgitation. No evidence of mitral valve stenosis. Tricuspid Valve: The tricuspid valve is grossly normal. Tricuspid valve regurgitation is trivial. No evidence of tricuspid stenosis. Aortic Valve: The aortic valve is tricuspid. Aortic valve regurgitation is not visualized. No aortic stenosis is present. Aortic valve mean gradient measures 2.0 mmHg. Aortic valve peak gradient measures 3.5 mmHg. Aortic  valve area, by VTI measures 4.01 cm. Pulmonic Valve: The pulmonic valve was grossly normal. Pulmonic valve regurgitation is not visualized. Aorta: Ascending aorta not well seen. Measurments likely inaccurate. Recent CT with normal aortic measurements. The aortic root is normal in size and structure. Venous: The inferior vena cava is normal in size with greater than 50% respiratory variability, suggesting right atrial pressure of 3 mmHg. IAS/Shunts: The atrial septum is grossly normal.  LEFT VENTRICLE PLAX 2D LVIDd:         5.00 cm   Diastology LVIDs:         3.20 cm   LV e' medial:    6.84 cm/s LV PW:         1.20 cm   LV E/e' medial:  7.4 LV IVS:        1.10 cm   LV e' lateral:   4.82 cm/s LVOT diam:     2.20 cm   LV E/e' lateral: 10.5 LV SV:         65 LV SV Index:   30 LVOT Area:     3.80 cm  RIGHT VENTRICLE RV S prime:     19.70 cm/s TAPSE (M-mode): 2.1 cm LEFT ATRIUM             Index        RIGHT ATRIUM           Index LA Vol (A2C):   29.1 ml 13.55 ml/m  RA Area:     13.50 cm LA Vol (A4C):   41.3 ml 19.23 ml/m  RA Volume:   31.00 ml  14.44 ml/m LA Biplane Vol: 37.6 ml  17.51 ml/m  AORTIC VALVE AV Area (Vmax):    3.60 cm AV Area (Vmean):   3.56 cm AV Area (VTI):     4.01 cm AV Vmax:           93.60 cm/s AV Vmean:          64.200 cm/s AV VTI:            0.163 m AV Peak Grad:      3.5 mmHg AV Mean Grad:      2.0 mmHg LVOT Vmax:         88.60 cm/s LVOT Vmean:        60.100 cm/s LVOT VTI:          0.172 m LVOT/AV VTI ratio: 1.06  AORTA Ao Root diam: 3.80 cm Ao Asc diam:  4.40 cm MITRAL VALVE MV Area (PHT): 2.91 cm    SHUNTS MV Decel Time: 261 msec    Systemic VTI:  0.17 m MV E velocity: 50.70 cm/s  Systemic Diam: 2.20 cm MV A velocity: 60.00 cm/s MV E/A ratio:  0.85 Lennie Odor MD Electronically signed by Lennie Odor MD Signature Date/Time: 10/22/2022/3:43:42 PM    Final    MR BRAIN WO CONTRAST  Result Date: 10/22/2022 CLINICAL DATA:  Stroke follow-up EXAM: MRI HEAD WITHOUT CONTRAST TECHNIQUE: Multiplanar, multiecho pulse sequences of the brain and surrounding structures were obtained without intravenous contrast. COMPARISON:  CT/CTA head and neck and brain MRI 1 day prior FINDINGS: This is a limited study with axial DWI and axial SWI sequences obtained. Brain: There is diffusion restriction in the right frontal lobe with involvement of the precentral gyrus and pre motor region, corona radiata, and operculum consistent with acute infarcts. The extent of the infarcts appears progressed compared to the MRI from 1 day prior. There is no  hemorrhage or mass effect. Parenchymal volume is stable. The ventricles are stable in size. There is no midline shift. IMPRESSION: Limited study with only SWI and DWI sequences obtained. Acute infarcts in the right MCA distribution appear progressed in extent compared to the study from 1 day prior. No hemorrhage or mass effect. Electronically Signed   By: Lesia Hausen M.D.   On: 10/22/2022 11:38   CT ANGIO HEAD NECK W WO CM (CODE STROKE)  Result Date: 10/21/2022 CLINICAL DATA:  Neuro deficit, acute, stroke suspected EXAM: CT ANGIOGRAPHY HEAD  AND NECK WITH AND WITHOUT CONTRAST TECHNIQUE: Multidetector CT imaging of the head and neck was performed using the standard protocol during bolus administration of intravenous contrast. Multiplanar CT image reconstructions and MIPs were obtained to evaluate the vascular anatomy. Carotid stenosis measurements (when applicable) are obtained utilizing NASCET criteria, using the distal internal carotid diameter as the denominator. RADIATION DOSE REDUCTION: This exam was performed according to the departmental dose-optimization program which includes automated exposure control, adjustment of the mA and/or kV according to patient size and/or use of iterative reconstruction technique. CONTRAST:  75mL OMNIPAQUE IOHEXOL 350 MG/ML SOLN COMPARISON:  CT Head 12/18/21 FINDINGS: CT HEAD FINDINGS See same day CT brain for intracranial findings. CTA NECK FINDINGS Aortic arch: Standard branching. Imaged portion shows no evidence of aneurysm or dissection. No significant stenosis of the major arch vessel origins. Right carotid system: No evidence of dissection, stenosis (50% or greater), or occlusion. Left carotid system: No evidence of dissection, stenosis (50% or greater), or occlusion. Vertebral arteries: Codominant. No evidence of dissection, stenosis (50% or greater), or occlusion. Skeleton: Status post C3-C4 ACDF with solid osseous fusion. There is also posterior fusion of the level. Patient is edentulous. Other neck: Negative. Upper chest: Redemonstrated right hilar lymphadenopathy, better assessed on prior CT chest abdomen and pelvis. No pleural effusion. No pneumothorax. Right chest port in place. Mild circumferential esophageal thickening Review of the MIP images confirms the above findings CTA HEAD FINDINGS Anterior circulation: No significant stenosis, proximal occlusion, aneurysm, or vascular malformation. Posterior circulation: No significant stenosis, proximal occlusion, aneurysm, or vascular malformation. Venous  sinuses: As permitted by contrast timing, patent. Anatomic variants: Fetal PCAs bilaterally. Review of the MIP images confirms the above findings IMPRESSION: 1. No intracranial large vessel occlusion or significant stenosis. 2. No hemodynamically significant stenosis in the neck. 3. Redemonstrated right hilar lymphadenopathy, better assessed on prior CT chest abdomen and pelvis. 4. Mild circumferential esophageal thickening, which can be seen in the setting of esophagitis. Electronically Signed   By: Lorenza Cambridge M.D.   On: 10/21/2022 14:25   MR BRAIN W WO CONTRAST  Result Date: 10/21/2022 CLINICAL DATA:  Transient ischemic attack (TIA) Stroke, follow up Neuro deficit, acute, stroke suspected Metastatic disease evaluation. EXAM: MRI HEAD WITHOUT AND WITH CONTRAST TECHNIQUE: Multiplanar, multiecho pulse sequences of the brain and surrounding structures were obtained without and with intravenous contrast. CONTRAST:  9mL GADAVIST GADOBUTROL 1 MMOL/ML IV SOLN COMPARISON:  Head CT 10/21/2022.  MRI brain 04/09/2020. FINDINGS: Brain: Restricted diffusion in the right precentral gyrus and pre motor area, consistent with acute infarct. No acute hemorrhage or significant mass effect. Underlying mild chronic small-vessel disease. No hydrocephalus. Small bilateral middle cranial fossa arachnoid cysts, right-greater-than-left. No mass or abnormal enhancement. No foci of abnormal susceptibility. Vascular: Normal flow voids and vessel enhancement. Skull and upper cervical spine: Partially visualized susceptibility artifact from posterior spinal fusion hardware. Otherwise, normal marrow signal and enhancement. Sinuses/Orbits: Unremarkable. Other: None. IMPRESSION: 1.  Acute infarcts in the right precentral gyrus and pre motor area. 2. No evidence of intracranial metastatic disease. Electronically Signed   By: Orvan Falconer M.D.   On: 10/21/2022 14:14   CT HEAD CODE STROKE WO CONTRAST  Result Date: 10/21/2022 CLINICAL DATA:   Code stroke. Neuro deficit, acute, stroke suspected left-sided weakness EXAM: CT HEAD WITHOUT CONTRAST TECHNIQUE: Contiguous axial images were obtained from the base of the skull through the vertex without intravenous contrast. RADIATION DOSE REDUCTION: This exam was performed according to the departmental dose-optimization program which includes automated exposure control, adjustment of the mA and/or kV according to patient size and/or use of iterative reconstruction technique. COMPARISON:  CT Head 12/18/21 FINDINGS: Brain: No evidence of acute infarction, hemorrhage, hydrocephalus, extra-axial collection or mass lesion/mass effect. Enlarged and partially empty sella. Vascular: No hyperdense vessel or unexpected calcification. Skull: Normal. Negative for fracture or focal lesion. Sinuses/Orbits: No middle ear or mastoid effusion. Paranasal sinuses clear. Orbits are unremarkable. Other: None. ASPECTS [facility] Stroke Program Early CT Score): 10 IMPRESSION: No hemorrhage or CT evidence of an acute cortical infarct. Aspects 10. Findings were paged to Dr. Derry Lory on 10/21/22 at 1:11 PM via [facility] paging system. Electronically Signed   By: Lorenza Cambridge M.D.   On: 10/21/2022 13:14     PHYSICAL EXAM  Temp:  [97.6 F (36.4 C)-98.9 F (37.2 C)] 97.7 F (36.5 C) (06/10 1357) Pulse Rate:  [87-130] 99 (06/10 1357) Resp:  [12-32] 18 (06/10 1357) BP: (99-186)/(70-117) 109/83 (06/10 1357) SpO2:  [88 %-100 %] 100 % (06/10 1357)  General - Well nourished, well developed, in no apparent distress.  Ophthalmologic - fundi not visualized due to noncooperation.  Cardiovascular - Regular rhythm and rate.  Neuro - awake, alert, eyes open, orientated to age, place, year and people, he tried twice to get month right. No aphasia, fluent language but paucity of speech, following all simple commands. Able to name and repeat. No gaze palsy, tracking bilaterally, visual field full. Left mild facial droop. Tongue midline. RUE  5/5, LUE pronator drift. Bilaterally LEs 5/5, no drift. Sensation symmetrical bilaterally, b/l FTN intact but slow on the left, gait not tested.    ASSESSMENT/PLAN [name] is a 57 y.o. male with history of hypertension, diabetes, recurrent seizure on Keppra, PAD status post bypass, Graves' disease status post radiation, colon cancer on chemotherapy with liver and lung metastasis, thrombocytopenia admitted for left-sided weakness, left facial droop.  TNK given.    Stroke:  right MCA cortical small infarcts, embolic pattern, etiology unclear, could be from cardioembolic source versus hypercoag state from advanced cancer CT no acute abnormality CT head and neck unremarkable MRI right MCA scattered cortical infarcts, no evidence of intracranial metastasis disease 2D Echo EF 60 to 65% LE venous Doppler no DVT LDL 68 HgbA1c 7.5 UDS negative Lovenox for VTE prophylaxis aspirin 81 mg daily prior to admission, on Eliquis now given cancer progression, along with intermittent heart palpitation  Patient counseled to be compliant with his antithrombotic medications Ongoing aggressive stroke risk factor management Therapy recommendations: Outpatient PT OT Disposition: Pending  Colon cancer with metastasis Diagnosed colon cancer in 2017 Status post multiple rounds of chemotherapy with long and recurrent liver metastasis Follow-up with Dr. Truett Perna at [facility] CT Progressive disease with progressive mediastinal, pericardial, and periportal adenopathy as well as progressive hepatic metastatic disease. Stable pulmonary metastatic disease. Decided on Eliquis after discussion with Dr. Truett Perna  Heart palpitation Tachycardia Per patient and wife, patient does have  infrequent heart palpitation More frequent palpitation episodes in the past due to Graves' disease,  has improved after treatment Not sure if A-fib episode, but decided on Eliquis after discussion with pt and family as well as Dr.  Truett Perna Resume the home metoprolol 100 twice daily -> increased to 150 twice daily today for persistent tachycardia Pt will continue to follow up with his cardiologist Dr. Kirke Corin.   Diabetes HgbA1c 7.5 goal < 7.0 Uncontrolled Resume farxiga and metoprolol CBG monitoring SSI DM education and close PCP follow up  Hypertension Stable on the high side Resume home metoprolol and amlodipine BP goal <180/105 Long term BP goal normotensive  Hyperlipidemia Home meds: Lipitor 80 LDL 68, goal < 70 Now on Lipitor 80 Continue statin at discharge  Other Stroke Risk Factors Obesity, Body mass index is 30.68 kg/m.  PAD status post bypass  Other Active Problems Graves' disease status post radiation, now on levothyroxine - TSH and FT4 normal Recurrent seizure, on Keppra 700 mg twice daily - follows with Dr. Karel Jarvis at [facility] Elevated LFT, AST/ALT 51/46->43/42  Hospital day # 3  This patient is critically ill due to acute stroke status post TNK, uncontrolled diabetes, colon cancer with metastasis, history of seizure and at significant risk of neurological worsening, death form recurrent stroke, hemorrhagic transmission, status epilepticus. This patient's care requires constant monitoring of vital signs, hemodynamics, respiratory and cardiac monitoring, review of multiple databases, neurological assessment, discussion with family, other specialists and medical decision making of high complexity. I spent 30 minutes of neurocritical care time in the care of this patient. I had long discussion with wife and patient at bedside, updated pt current condition, treatment plan and potential prognosis, and answered all the questions.  They expressed understanding and appreciation.   Marvel Plan, MD PhD Stroke Neurology 10/24/2022 6:18 PM    To contact Stroke Continuity provider, please refer to [email or web address]. After hours, contact General Neurology

## 2022-10-24 NOTE — Progress Notes (Addendum)
IP PROGRESS NOTE  Subjective:   Mr. [name] appears unchanged.  He reports improvement in chest tightness.  No dyspnea.  He continues to have tachycardia. Objective: Vital signs in last 24 hours: Blood pressure 107/70, pulse (!) 106, temperature 97.6 F (36.4 C), temperature source Oral, resp. rate 18, height 5\' 10"  (1.778 m), weight 213 lb 13.5 oz (97 kg), SpO2 93 %.  Intake/Output from previous day: 06/09 0701 - 06/10 0700 In: 500 [P.O.:500] Out: 1200 [Urine:1200]  Physical Exam:  HEENT: No thrush or ulcers Lungs: Clear bilaterally Cardiac: Regular rate and rhythm Abdomen: No hepatosplenomegaly Extremities: No leg edema Neurologic: Alert and oriented, speech is fluent, the face is symmetric, weakness of the left hand   Portacath/PICC-without erythema  Lab Results: Recent Labs    10/23/22 0234 10/24/22 0318  WBC 6.5 8.3  HGB 12.3* 12.4*  HCT 39.6 40.1  PLT 114* 125*    BMET Recent Labs    10/23/22 0234 10/24/22 0318  NA 136 135  K 3.7 4.1  CL 100 98  CO2 24 24  GLUCOSE 111* 103*  BUN 7 7  CREATININE 1.00 0.94  CALCIUM 8.8* 9.1    Lab Results  Component Value Date   CEA1 3.19 09/10/2020   CEA 203.54 (H) 10/11/2022    Studies/Results: CT CHEST ABDOMEN PELVIS W CONTRAST  Result Date: 10/22/2022 CLINICAL DATA:  Metastatic colon cancer, restaging examination. *Tracking code: [email or web address]* EXAM: CT CHEST, ABDOMEN, AND PELVIS WITH CONTRAST TECHNIQUE: Multidetector CT imaging of the chest, abdomen and pelvis was performed following the standard protocol during bolus administration of intravenous contrast. RADIATION DOSE REDUCTION: This exam was performed according to the departmental dose-optimization program which includes automated exposure control, adjustment of the mA and/or kV according to patient size and/or use of iterative reconstruction technique. CONTRAST:  75mL OMNIPAQUE IOHEXOL 350 MG/ML SOLN COMPARISON:  08/11/2022 FINDINGS: CT CHEST FINDINGS Cardiovascular:  Cardiac size within normal limits. Mild coronary artery calcification. No pericardial effusion. Central pulmonary arteries are of normal caliber. Mild atherosclerotic calcification within the thoracic aorta. No aortic aneurysm. Right internal jugular chest port tip noted within the superior vena cava. Mediastinum/Nodes: Pathologic right hilar adenopathy is stable with the index lymph node measuring 1.2 x 1.8 cm at axial image # 26/3. Progressive right paraesophageal adenopathy with the index lymph node measuring 14 mm in short axis diameter at axial image # 32/3, previously measuring 7 mm. New pericardial adenopathy with the index lymph node measuring 2.1 x 3.5 cm at axial image # 42/3, new since prior exam. Thyroid is atrophic. Esophagus is unremarkable. Lungs/Pleura: Numerous multilobulated pulmonary metastases are stable in size and number. Index lesion within the right middle lobe measures 2.8 x 3.6 cm at axial image # 81/4. Index lesion within the lingula measures 3.1 x 4.1 cm at axial image # 82/4. New trace right pleural effusion. No pneumothorax. No central obstructing lesion. Musculoskeletal: No focal lytic or blastic bone lesion. CT ABDOMEN PELVIS FINDINGS Hepatobiliary: Numerous hypoenhancing metastatic foci are seen within the liver bilaterally, progressive since prior examination. Index lesion within the left hepatic lobe, axial image # 48/3, measures 2.1 x 2.4 cm. Index lesion within the right hepatic lobe at axial image # 50/3 measures 1.9 x 2.0 cm. Mild hepatic steatosis. Cholelithiasis noted without pericholecystic inflammatory change. No intra or extrahepatic biliary ductal dilation. Pancreas: Unremarkable Spleen: Unremarkable Adrenals/Urinary Tract: The adrenal glands are unremarkable. The kidneys are normal in size and position. 4 mm nonobstructing calculus again noted within the interpolar region  of the left kidney. No hydronephrosis. No ureteral calculi. The bladder is unremarkable.  Stomach/Bowel: Status post sigmoid colectomy. The stomach, small bowel, and large bowel are otherwise unremarkable. Appendix normal. No free intraperitoneal gas or fluid. Vascular/Lymphatic: Mild aortoiliac atherosclerotic calcification. No aortic aneurysm. Pathologic periportal adenopathy has progressed with the index lymph node measuring 1.8 x 3.1 cm at axial image # 63/3. Left periaortic adenopathy appears stable measuring 1.4 x 2.3 cm at axial image # 89/3. Reproductive: Moderate prostatic hypertrophy. Other: No abdominal wall hernia. Musculoskeletal: L4-S1 lumbar fusion with instrumentation and L5 posterior decompression has been performed. No acute bone abnormality. No lytic or blastic bone lesion. IMPRESSION: 1. Progressive disease with progressive mediastinal, pericardial, and periportal adenopathy as well as progressive hepatic metastatic disease. Stable pulmonary metastatic disease. 2. Mild coronary artery calcification. 3. Cholelithiasis. 4. Mild left nonobstructing nephrolithiasis. 5. Moderate prostatic hypertrophy. 6. Status post sigmoid colectomy. Aortic Atherosclerosis (ICD10-I70.0). Electronically Signed   By: Helyn Numbers M.D.   On: 10/22/2022 23:42   ECHOCARDIOGRAM COMPLETE  Result Date: 10/22/2022    ECHOCARDIOGRAM REPORT   Patient Name:   Jeff [name] Date of Exam: 10/22/2022 Medical Rec #:  [id number]        Height:       70.0 in Accession #:    [id number]       Weight:       213.8 lb Date of Birth:  08-28-1965        BSA:          2.148 m Patient Age:    57 years         BP:           150/100 mmHg Patient Gender: M                HR:           97 bpm. Exam Location:  Inpatient Procedure: 2D Echo, Cardiac Doppler and Color Doppler Indications:    Stroke  History:        Patient has no prior history of Echocardiogram examinations.                 Stroke and PAD; Risk Factors:Hypertension, Sleep Apnea and                 Diabetes.  Sonographer:    Lucy Antigua Referring Phys: [id number] Lynnae January IMPRESSIONS  1. Left ventricular ejection fraction, by estimation, is 60 to 65%. The left ventricle has normal function. The left ventricle has no regional wall motion abnormalities. There is mild concentric left ventricular hypertrophy. Left ventricular diastolic parameters are consistent with Grade I diastolic dysfunction (impaired relaxation).  2. Right ventricular systolic function is normal. The right ventricular size is normal. Tricuspid regurgitation signal is inadequate for assessing PA pressure.  3. The mitral valve is grossly normal. Trivial mitral valve regurgitation. No evidence of mitral stenosis.  4. The aortic valve is tricuspid. Aortic valve regurgitation is not visualized. No aortic stenosis is present.  5. Ascending aorta not well seen. Measurments likely inaccurate. Recent CT with normal aortic measurements.  6. The inferior vena cava is normal in size with greater than 50% respiratory variability, suggesting right atrial pressure of 3 mmHg. Conclusion(s)/Recommendation(s): No intracardiac source of embolism detected on this transthoracic study. Consider a transesophageal echocardiogram to exclude cardiac source of embolism if clinically indicated. FINDINGS  Left Ventricle: Left ventricular ejection fraction, by estimation, is 60 to 65%. The left ventricle has normal  function. The left ventricle has no regional wall motion abnormalities. The left ventricular internal cavity size was normal in size. There is  mild concentric left ventricular hypertrophy. Left ventricular diastolic parameters are consistent with Grade I diastolic dysfunction (impaired relaxation). Right Ventricle: The right ventricular size is normal. No increase in right ventricular wall thickness. Right ventricular systolic function is normal. Tricuspid regurgitation signal is inadequate for assessing PA pressure. Left Atrium: Left atrial size was normal in size. Right Atrium: Right atrial size was normal in size.  Pericardium: Trivial pericardial effusion is present. Mitral Valve: The mitral valve is grossly normal. Trivial mitral valve regurgitation. No evidence of mitral valve stenosis. Tricuspid Valve: The tricuspid valve is grossly normal. Tricuspid valve regurgitation is trivial. No evidence of tricuspid stenosis. Aortic Valve: The aortic valve is tricuspid. Aortic valve regurgitation is not visualized. No aortic stenosis is present. Aortic valve mean gradient measures 2.0 mmHg. Aortic valve peak gradient measures 3.5 mmHg. Aortic valve area, by VTI measures 4.01 cm. Pulmonic Valve: The pulmonic valve was grossly normal. Pulmonic valve regurgitation is not visualized. Aorta: Ascending aorta not well seen. Measurments likely inaccurate. Recent CT with normal aortic measurements. The aortic root is normal in size and structure. Venous: The inferior vena cava is normal in size with greater than 50% respiratory variability, suggesting right atrial pressure of 3 mmHg. IAS/Shunts: The atrial septum is grossly normal.  LEFT VENTRICLE PLAX 2D LVIDd:         5.00 cm   Diastology LVIDs:         3.20 cm   LV e' medial:    6.84 cm/s LV PW:         1.20 cm   LV E/e' medial:  7.4 LV IVS:        1.10 cm   LV e' lateral:   4.82 cm/s LVOT diam:     2.20 cm   LV E/e' lateral: 10.5 LV SV:         65 LV SV Index:   30 LVOT Area:     3.80 cm  RIGHT VENTRICLE RV S prime:     19.70 cm/s TAPSE (M-mode): 2.1 cm LEFT ATRIUM             Index        RIGHT ATRIUM           Index LA Vol (A2C):   29.1 ml 13.55 ml/m  RA Area:     13.50 cm LA Vol (A4C):   41.3 ml 19.23 ml/m  RA Volume:   31.00 ml  14.44 ml/m LA Biplane Vol: 37.6 ml 17.51 ml/m  AORTIC VALVE AV Area (Vmax):    3.60 cm AV Area (Vmean):   3.56 cm AV Area (VTI):     4.01 cm AV Vmax:           93.60 cm/s AV Vmean:          64.200 cm/s AV VTI:            0.163 m AV Peak Grad:      3.5 mmHg AV Mean Grad:      2.0 mmHg LVOT Vmax:         88.60 cm/s LVOT Vmean:        60.100 cm/s LVOT  VTI:          0.172 m LVOT/AV VTI ratio: 1.06  AORTA Ao Root diam: 3.80 cm Ao Asc diam:  4.40 cm MITRAL VALVE MV Area (PHT):  2.91 cm    SHUNTS MV Decel Time: 261 msec    Systemic VTI:  0.17 m MV E velocity: 50.70 cm/s  Systemic Diam: 2.20 cm MV A velocity: 60.00 cm/s MV E/A ratio:  0.85 Lennie Odor MD Electronically signed by Lennie Odor MD Signature Date/Time: 10/22/2022/3:43:42 PM    Final     Medications: I have reviewed the patient's current medications.  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, biopsy confirmed adenocarcinoma         CTs chest, abdomen, and pelvis on 12/11/2015-indeterminate tiny pulmonary nodules, multiple liver metastases, small nodes in the sigmoid mesocolon Laparoscopic sigmoid colectomy, liver biopsy, Port-A-Cath placement 01/14/2016 Pathology sigmoid colon resection- colonic adenocarcinoma, 5.3 cm extending into pericolonic connective tissue, positive lymph-vascular invasion, positive perineural invasion, negative margins, metastatic carcinoma in 9 of 28 lymph nodes Pathology liver biopsy-metastatic colorectal adenocarcinoma MSI stable; mismatch repair protein normal APC alteration, K RAS wild-type, no BRAF mutation Cycle 1 FOLFOX 02/02/2016 Cycle 2 FOLFOX 02/15/2016 Cycle 3 FOLFOX 02/29/2016 Cycle 4 FOLFOX 03/14/2016 Cycle 5 FOLFOX 03/28/2016 Cycle 6 FOLFOX 04/11/2016 (oxaliplatin held secondary to thrombocytopenia) 04/21/2016 restaging CTs-difficulty evaluating liver lesions due to hepatic steatosis. Stable right adrenal nodule. No adenopathy or local recurrence near the rectosigmoid anastomotic site. Cycle 7 FOLFOX 04/25/2016 MRI liver 05/02/2016-partial improvement in hepatic metastases Cycle 8 FOLFOX 05/10/2016 Cycle 9 FOLFOX 05/23/2016 (oxaliplatin held due to thrombocytopenia) Cycle 10 FOLFOX 06/06/2016 (oxaliplatin dose reduced due to thrombocytopenia) Cycle 11 FOLFOX 06/27/2016  (oxaliplatin held due to neuropathy) Cycle 12 FOLFOX 07/11/2016 (oxaliplatin held) Initiation of maintenance Xeloda 7 days on/7 days off 07/27/2016 MRI liver 11/18/2016-decrease in hepatic metastatic disease. No new or progressive disease identified within the abdomen. Continuation of Xeloda 7 days on/7 days off MRI liver 04/27/2017-previous liver lesions not identified, no new lesions, no lymphadenopathy Xeloda continued 7 days on/7 days off MRI liver 12/04/2017 - no evidence of metastatic disease, hepatic steatosis Xeloda continued 7 days on/7 days off MRI liver 07/15/2018- no evidence of metastatic disease.  Stable severe hepatic steatosis. Xeloda continued 7 days on/7 days off MRI liver 03/16/2019-hepatic steatosis, no liver mass, focal area of intrahepatic biliary dilatation in segments 2 and 3 of the left lobe-increased Xeloda continued 7 days on/7 days off MRI abdomen 08/19/2019-no findings to suggest liver metastases.  Bilateral lung nodules measuring up to 2.8 cm, progressive and more conspicuous than on previous exam CT chest 08/29/2019-multiple pulmonary metastases, new from 04/21/2016 Cycle 1 FOLFIRI/bevacizumab 09/09/2019 Cycle 2 FOLFIRI/bevacizumab 09/26/2019  Cycle 3 FOLFIRI/bevacizumab 10/10/2019 Cycle 4 FOLFIRI/bevacizumab 10/24/2019 Cycle 5 FOLFIRI/bevacizumab 11/07/2019 CT chest 11/14/2019-decreased size of lung nodules, no new lesions, hepatic steatosis Cycle 6 FOLFIRI/bevacizumab 11/21/2019 Cycle 7 FOLFIRI/bevacizumab 12/05/2019 Cycle 8 FOLFIRI/bevacizumab 12/19/2019 Cycle 9 FOLFIRI/bevacizumab 01/02/2020 Cycle 10 FOLFIRI/bevacizumab 01/16/2020 CT chest 01/29/2020-stable bilateral pulmonary metastases.  No new or progressive metastatic disease in the chest. Cycle 11 FOLFIRI/bevacizumab 01/30/2020 Cycle 12 FOLFIRI/bevacizumab 02/19/2020 Cycle 13 FOLFIRI/bevacizumab 03/12/2020 Cycle 14 FOLFIRI/bevacizumab 04/02/2020 Cycle 15 FOLFIRI/bevacizumab 04/23/2020 Cycle 16 FOLFIRI/bevacizumab  05/21/2020 CT chest 06/09/2020-mild progression pulmonary metastasis.  Some lesions have increased in size while others are similar. Cycle 1 irinotecan/Panitumumab 06/18/2020 Cycle 2 irinotecan/Panitumumab 07/02/2020 Cycle 3 irinotecan/panitumumab 07/16/2020 Cycle 4 irinotecan/Panitumumab 07/30/2020 Cycle 5 irinotecan/Panitumumab 08/13/2020, Emend added Cycle 6 irinotecan/Panitumumab 08/27/2020 CT chest 09/08/2020-decreased size of pulmonary nodules, no progressive disease Cycle 7 irinotecan/panitumumab 09/02/2020 Cycle 8 irinotecan/panitumumab 09/24/2020 Cycle 9 irinotecan/Panitumumab 10/08/2020 10/22/2020 treatment held due to left foot fracture, need for surgery Cycle 10 irinotecan/panitumumab 11/24/2020 Cycle  11 irinotecan/Panitumumab 12/08/2020 Cycle 12 irinotecan/panitumumab 12/22/2020 Cycle 13 irinotecan/panitumumab 01/05/2021 01/20/2021 CT chest-mixed response with minimal increase in size of some lesions and minimal decrease in the size of other lesions.  Overall number of lesions is unchanged. Cycle 14 irinotecan/Panitumumab 02/05/2021 Cycle 15 irinotecan/Panitumumab 02/23/2021 Cycle 16 irinotecan/panitumumab 03/16/2021 Cycle 17 irinotecan/Panitumumab 04/13/2021 05/03/2021-CT chest-enlargement of pulmonary metastases, no new lesions 06/21/2021-cycle 1 Lonsurf/bevacizumab 07/19/2021-cycle 2 Lonsurf/bevacizumab 08/16/2021-cycle 3 Lonsurf/bevacizumab 09/09/2021 CT chest-bilateral lung nodules and masses with mixed response, overall stable to very minimally increased 09/13/2021 cycle 4 Lonsurf/bevacizumab 10/12/2021 cycle 5 Lonsurf/bevacizumab 10/21/2021 chest CT-bilateral pulmonary metastases without significant change.  No new or progressive disease within the chest. 11/08/2021 cycle 6 Lonsurf/bevacizumab 12/06/2021 cycle 7 Lonsurf/bevacizumab 01/03/2022 cycle 8 Lonsurf/bevacizumab CTs 01/25/2022-index lung lesion stable to mildly increased in size.  Mild retroperitoneal adenopathy, mildly increased in size compared  to 12/16/2020. 02/01/2022 cycle 9 Lonsurf/bevacizumab 02/28/2022 cycle 10 Lonsurf/bevacizumab 03/28/2022 cycle 11 Lonsurf/bevacizumab CTs 04/21/2022-no change in bilateral pulmonary metastases, 2 new hypoattenuating liver lesions, new enlarged portacaval node Cycle 1 fruquintinib 05/24/2022 Cycle 2 fruquintinib 06/21/2022 Cycle 3 fruquintinib 07/19/2022 CTs 08/12/2022-stable lung nodules, hilar and retroperitoneal nodes, slight increase in size of liver metastases, stable right adrenal nodule Cycle 4 fruquintinib 08/16/2022 Cycle 5 fruquintinib 09/13/2022 Cycle 6 fruquintinib 10/11/2022 CTs 10/22/2022-progressive mediastinal, pericardial, and periportal adenopathy, enlargement of liver lesions, stable pulmonary nodules   2.   Rectal bleeding and constipation secondary to #1   3.   History of peripheral vascular disease, status post left lower extremity vascular bypass surgery in April 2017   4.   History of nephrolithiasis   5.   History of Graves' disease treated with radioactive iodine   6.   Anxiety/depression   7.   Hypertension   8.   Hospitalization 01/19/2016 with wound dehiscence status post secondary suture closure of abdominal wall   9.   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 Hyperglycemia 06/20/2016-diagnosed with diabetes, maintained on insulin   11.  Positive COVID test 12/13/2018; positive COVID test 01/25/2021   12. Hospitalized with seizure activity/DKA. Now on Keppra, insulin adjusted. 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  Left foot fracture-surgical repair 11/11/2020 14.  Admission 10/21/2022 with an acute CVA 10/21/2022-MRI brain-acute infarct in the right precentral gyrus and premotor area, negative for brain  metastases CT angiogram head/neck 10/21/2022-no large vessel occlusion or stenosis TNK 10/21/2022  Mr. Koos was admitted with a right brain CVA.  Left hand weakness persists.  He is now maintained on apixaban anticoagulation. The persistent tachycardia is unclear.  This could be related to holding metoprolol on admission.  He has a previous history of Graves' disease.  I will check a TSH and T4.  I have a low clinical suspicion for a primary cardiopulmonary process.  He will return for a follow-up visit as scheduled on 11/07/2022.  We will discuss salvage treatment options, most likely FOLFOX or CAPOX.    Recommendations: Discontinue fruquintinib Apixaban as recommended by neurology Hypertension management per neurology, resume metoprolol Follow-up TSH and T4 from today Post stroke care per neurology Outpatient follow-up as scheduled at the Cancer center       LOS: 3 days   Thornton Papas, MD   10/24/2022, 1:21 PM

## 2022-10-24 NOTE — Progress Notes (Signed)
Occupational Therapy Treatment Patient Details Name: Jeff [name] MRN: [id number] DOB: 09/10/1965 Today's Date: 10/24/2022   History of present illness 57 yo male presenting via EMS 6/7 with L hemiparesis and L facial droop. 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 TNK given 6/7 at 1332. PMH includes: seizures, NSTEMI, metastatic colon CA, DM II, PAD, and hypothroidism.   OT comments  Pt continues to show return to L UE during OT session. OT will not splint for night time at this time as pt has wrist extension and flexion present. Recommendation for outpatient hand therapist ( neuro) based is still preferred for follow up.     Recommendations for follow up therapy are one component of a multi-disciplinary discharge planning process, led by the attending physician.  Recommendations may be updated based on patient status, additional functional criteria and insurance authorization.    Assistance Recommended at Discharge Set up Supervision/Assistance  Patient can return home with the following  Assist for transportation   Equipment Recommendations  None recommended by OT    Recommendations for Other Services      Precautions / Restrictions Precautions Precautions: None Restrictions Weight Bearing Restrictions: No       Mobility Bed Mobility               General bed mobility comments: oob at the door in chair. wife reports pt does not like closed spaces    Transfers Overall transfer level: Needs assistance   Transfers: Sit to/from Stand Sit to Stand: Supervision                 Balance                                           ADL either performed or assessed with clinical judgement   ADL Overall ADL's : Needs assistance/impaired                                       General ADL Comments: pt with wife (A) for adls as needed but able to use L hand to hold and R hand to complete task     Extremity/Trunk Assessment Upper Extremity Assessment LUE Deficits / Details: pt with active flexion/ extensin of wrist, supination/ pronation active rom, shoulder flexion extension active ROM and elbow flexion and extension. pt is able to keep wrist in neutral and begin to extend digits and flex with some delays and not reaching full ROM. pt lacks thumb abduction at this time. pt continues to show improvement so will not night time splint with active wrist flexion/ extension LUE Coordination: decreased fine motor;decreased gross motor            Vision       Perception     Praxis      Cognition Arousal/Alertness: Awake/alert Behavior During Therapy: WFL for tasks assessed/performed Overall Cognitive Status: Within Functional Limits for tasks assessed                                          Exercises Other Exercises Other Exercises: demonstrates PNF D1 pattern, demonstrates all exercises x10 reps Other Exercises: Access Code: [email or web address]  Shoulder Instructions       General Comments pt progressed and walked the unit x2 with supervision    Pertinent Vitals/ Pain       Pain Assessment Pain Assessment: No/denies pain  Home Living                                          Prior Functioning/Environment              Frequency  Min 2X/week        Progress Toward Goals  OT Goals(current goals can now be found in the care plan section)  Progress towards OT goals: Progressing toward goals  Acute Rehab OT Goals Patient Stated Goal: to get L hand working and go home OT Goal Formulation: With patient/family Time For Goal Achievement: 11/05/22 Potential to Achieve Goals: Good ADL Goals Pt/caregiver will Perform Home Exercise Program: Increased strength;Increased ROM;Left upper extremity;With written HEP provided;With Supervision Additional ADL Goal #1: pt will complete toileting hygiene indep  Plan Discharge plan remains  appropriate    Co-evaluation                 AM-PAC OT "6 Clicks" Daily Activity     Outcome Measure   Help from another person eating meals?: A Little Help from another person taking care of personal grooming?: A Little Help from another person toileting, which includes using toliet, bedpan, or urinal?: A Little Help from another person bathing (including washing, rinsing, drying)?: A Little Help from another person to put on and taking off regular upper body clothing?: A Little Help from another person to put on and taking off regular lower body clothing?: A Little 6 Click Score: 18    End of Session Equipment Utilized During Treatment: Gait belt  OT Visit Diagnosis: Unsteadiness on feet (R26.81);Muscle weakness (generalized) (M62.81)   Activity Tolerance Patient tolerated treatment well   Patient Left in chair;with call bell/phone within reach;with family/visitor present   Nurse Communication Mobility status;Precautions        Time: 1041-1100 OT Time Calculation (min): 19 min  Charges: OT General Charges $OT Visit: 1 Visit OT Treatments $Therapeutic Exercise: 8-22 mins   Brynn, OTR/L  Acute Rehabilitation Services Office: [phone] .   Mateo Flow 10/24/2022, 3:57 PM

## 2022-10-25 ENCOUNTER — Other Ambulatory Visit: Payer: Self-pay | Admitting: Neurology

## 2022-10-25 ENCOUNTER — Ambulatory Visit (HOSPITAL_BASED_OUTPATIENT_CLINIC_OR_DEPARTMENT_OTHER): Payer: Medicaid Other

## 2022-10-25 ENCOUNTER — Other Ambulatory Visit: Payer: Self-pay

## 2022-10-25 DIAGNOSIS — I639 Cerebral infarction, unspecified: Secondary | ICD-10-CM

## 2022-10-25 DIAGNOSIS — I63511 Cerebral infarction due to unspecified occlusion or stenosis of right middle cerebral artery: Secondary | ICD-10-CM | POA: Diagnosis not present

## 2022-10-25 LAB — BASIC METABOLIC PANEL
Anion gap: 10 (ref 5–15)
BUN: 15 mg/dL (ref 6–20)
CO2: 27 mmol/L (ref 22–32)
Calcium: 9.1 mg/dL (ref 8.9–10.3)
Chloride: 100 mmol/L (ref 98–111)
Creatinine, Ser: 1.48 mg/dL — ABNORMAL HIGH (ref 0.61–1.24)
GFR, Estimated: 56 mL/min — ABNORMAL LOW (ref >60)
Glucose, Bld: 111 mg/dL — ABNORMAL HIGH (ref 70–99)
Potassium: 3.6 mmol/L (ref 3.5–5.1)
Sodium: 137 mmol/L (ref 135–145)

## 2022-10-25 LAB — CBC
HCT: 38.9 % — ABNORMAL LOW (ref 39.0–52.0)
Hemoglobin: 12.2 g/dL — ABNORMAL LOW (ref 13.0–17.0)
MCH: 27.7 pg (ref 26.0–34.0)
MCHC: 31.4 g/dL (ref 30.0–36.0)
MCV: 88.4 fL (ref 80.0–100.0)
Platelets: 145 10*3/uL — ABNORMAL LOW (ref 150–400)
RBC: 4.4 MIL/uL (ref 4.22–5.81)
RDW: 15.7 % — ABNORMAL HIGH (ref 11.5–15.5)
WBC: 7.8 10*3/uL (ref 4.0–10.5)
nRBC: 0 % (ref 0.0–0.2)

## 2022-10-25 LAB — GLUCOSE, CAPILLARY
Glucose-Capillary: 133 mg/dL — ABNORMAL HIGH (ref 70–99)
Glucose-Capillary: 138 mg/dL — ABNORMAL HIGH (ref 70–99)
Glucose-Capillary: 142 mg/dL — ABNORMAL HIGH (ref 70–99)

## 2022-10-25 MED ORDER — SENNOSIDES-DOCUSATE SODIUM 8.6-50 MG PO TABS: 2.0000 | ORAL_TABLET | Freq: Every day | ORAL | Status: DC

## 2022-10-25 MED ORDER — METOPROLOL TARTRATE 50 MG PO TABS: 100.0000 mg | ORAL_TABLET | Freq: Two times a day (BID) | ORAL | Status: DC

## 2022-10-25 MED ORDER — APIXABAN 5 MG PO TABS: 5.0000 mg | ORAL_TABLET | Freq: Two times a day (BID) | ORAL | 0 refills | Status: DC

## 2022-10-25 MED ORDER — BISACODYL 10 MG RE SUPP
10.0000 mg | Freq: Once | RECTAL | Status: AC
  Administered 2022-10-25: 10 mg via RECTAL
  Filled 2022-10-25: qty 1

## 2022-10-25 MED ORDER — DOCUSATE SODIUM 100 MG PO CAPS
100.0000 mg | ORAL_CAPSULE | Freq: Every day | ORAL | Status: DC
  Administered 2022-10-25: 100 mg via ORAL
  Filled 2022-10-25: qty 1

## 2022-10-25 MED ORDER — POLYETHYLENE GLYCOL 3350 17 G PO PACK
17.0000 g | PACK | Freq: Every day | ORAL | Status: DC
  Administered 2022-10-25: 17 g via ORAL
  Filled 2022-10-25: qty 1

## 2022-10-25 NOTE — TOC Transition Note (Signed)
Transition of Care [facility]) - CM/SW Discharge Note   Patient Details  Name: Jeff [name] MRN: [id number] Date of Birth: 03-12-1966  [facility] CM/SW Contact:  Kermit Balo, RN Phone Number: 10/25/2022, 2:59 PM   Clinical Narrative:    Pt is discharging home with outpatient therapy through [facility]. Information on the AVS. CM provided pt with 30 day free card for Eliquis. Spouse providing transport home.   Final next level of care: OP Rehab Barriers to Discharge: No Barriers Identified   Patient Goals and CMS Choice CMS Medicare.gov Compare Post Acute Care list provided to:: Patient Choice offered to / list presented to : Patient, Spouse  Discharge Placement                         Discharge Plan and Services Additional resources added to the After Visit Summary for     Discharge Planning Services: CM Consult                                 Social Determinants of Health (SDOH) Interventions SDOH Screenings   Food Insecurity: No Food Insecurity (10/22/2022)  Housing: Low Risk  (10/22/2022)  Transportation Needs: No Transportation Needs (10/22/2022)  Utilities: Not At Risk (10/22/2022)  Tobacco Use: Low Risk  (10/21/2022)     Readmission Risk Interventions     No data to display

## 2022-10-25 NOTE — Progress Notes (Signed)
Physical Therapy Treatment Patient Details Name: Jeff [name] MRN: [id number] DOB: August 03, 1965 Today's Date: 10/25/2022   History of Present Illness 57 yo male presenting via EMS 6/7 with L hemiparesis and L facial droop. 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 TNK given 6/7 at 1332. PMH includes: seizures, NSTEMI, metastatic colon CA, DM II, PAD, and hypothroidism.    PT Comments    Pt's gait pattern is much better today and appears to be more like it was during his PT Eval. His balance was also better today. These fluctuations in presentation may be related to pt's fatigue as pt was treated in the morning today but later afternoon yesterday. Focused session on facilitating L UE muscular activation and coordination while also challenging pt's dynamic standing balance by having pt reach off BOS and grab/tap/throw various objects with his L UE. He reports his grip and utilization of his L hand is improving but is still impaired, specifically when opening his hand to release an object quickly. Will continue to follow acutely.     Recommendations for follow up therapy are one component of a multi-disciplinary discharge planning process, led by the attending physician.  Recommendations may be updated based on patient status, additional functional criteria and insurance authorization.  Follow Up Recommendations       Assistance Recommended at Discharge Intermittent Supervision/Assistance  Patient can return home with the following Help with stairs or ramp for entrance;Assistance with cooking/housework;Assist for transportation;A little help with bathing/dressing/bathroom   Equipment Recommendations  None recommended by PT    Recommendations for Other Services       Precautions / Restrictions Precautions Precautions: None Restrictions Weight Bearing Restrictions: No     Mobility  Bed Mobility               General bed mobility comments: Pt  standing in room upon arrival, sitting in chair end of session    Transfers Overall transfer level: Modified independent Equipment used: None Transfers: Sit to/from Stand Sit to Stand: Modified independent (Device/Increase time)           General transfer comment: Able to sit back down in chair without assistance end of session. Standing in room upon arrival today.    Ambulation/Gait Ambulation/Gait assistance: Supervision Gait Distance (Feet): 480 Feet Assistive device: None Gait Pattern/deviations: Step-through pattern Gait velocity: WFL Gait velocity interpretation: >4.37 ft/sec, indicative of normal walking speed   General Gait Details: Pt with improved L foot clearance and symmetry of bil step lengths today. Pt actually even able to jog ~300 ft of the ~480 ft total without LOB or physical assistance, supervision for safety   Stairs             Wheelchair Mobility    Modified Rankin (Stroke Patients Only) Modified Rankin (Stroke Patients Only) Pre-Morbid Rankin Score: No symptoms Modified Rankin: Moderately severe disability     Balance Overall balance assessment: Needs assistance, No apparent balance deficits (not formally assessed) Sitting-balance support: No upper extremity supported, Feet supported Sitting balance-Leahy Scale: Good     Standing balance support: No upper extremity supported Standing balance-Leahy Scale: Good Standing balance comment: Able to ambulate without UE support and withstand some dynamic gait challenges, mild imbalance when on uneven surfaces               High Level Balance Comments: Pt ambulating the long ways across x2 separate pillows while reaching off BOS and across midline with L UE to  pick up various objects on the ground and then reach off BOS to hand them to his wife, x1 set. Pt stood on blue balance disc progressing from feet shoulder width apart to Rhomberg stance and cued to hold a long handled sponge with his L  UE and reach off COG to tap various surfaces with the other end of it, intermittent LOB needing minA or pt to hold onto rail to recover, >5 min. Pt stood on blue balance disc and tossed a squeeze ball with his L UE (difficulty releasing grasp) to his wife anterior to him and then catching it with bil hands, > 5 min, intermittent minA or UE support to recover from LOB bouts.            Cognition Arousal/Alertness: Awake/alert Behavior During Therapy: WFL for tasks assessed/performed Overall Cognitive Status: Within Functional Limits for tasks assessed                                          Exercises Other Exercises Other Exercises: Pt ambulating the long ways across x2 separate pillows while reaching off BOS and across midline with L UE to pick up various objects on the ground and then reach off BOS to hand them to his wife, x1 set, challenging his L UE functional use and dynamic balance. Other Exercises: Pt stood on blue balance disc progressing from feet shoulder width apart to Rhomberg stance and cued to hold a long handled sponge with his L UE and reach off COG to tap various surfaces with the other end of it, intermittent LOB needing minA or pt to hold onto rail to recover, >5 min. Other Exercises: Pt stood on blue balance disc and tossed a squeeze ball with his L UE (difficulty releasing grasp) to his wife anterior to him and then catching it with bil hands, > 5 min, intermittent minA or UE support to recover from LOB bouts.    General Comments        Pertinent Vitals/Pain Pain Assessment Pain Assessment: Faces Faces Pain Scale: No hurt Pain Intervention(s): Monitored during session    Home Living                          Prior Function            PT Goals (current goals can now be found in the care plan section) Acute Rehab PT Goals Patient Stated Goal: to return home and continue to improve PT Goal Formulation: With patient/family Time For  Goal Achievement: 11/05/22 Potential to Achieve Goals: Good Progress towards PT goals: Progressing toward goals    Frequency    Min 4X/week      PT Plan Current plan remains appropriate    Co-evaluation              AM-PAC PT "6 Clicks" Mobility   Outcome Measure  Help needed turning from your back to your side while in a flat bed without using bedrails?: None Help needed moving from lying on your back to sitting on the side of a flat bed without using bedrails?: None Help needed moving to and from a bed to a chair (including a wheelchair)?: None Help needed standing up from a chair using your arms (e.g., wheelchair or bedside chair)?: None Help needed to walk in hospital room?: A Little Help needed climbing 3-5  steps with a railing? : A Little 6 Click Score: 22    End of Session Equipment Utilized During Treatment: Gait belt Activity Tolerance: Patient tolerated treatment well Patient left: with family/visitor present;in chair   PT Visit Diagnosis: Unsteadiness on feet (R26.81);Other abnormalities of gait and mobility (R26.89);Muscle weakness (generalized) (M62.81);Other symptoms and signs involving the nervous system (R29.898);Difficulty in walking, not elsewhere classified (R26.2)     Time: 4098-1191 PT Time Calculation (min) (ACUTE ONLY): 19 min  Charges:  $Neuromuscular Re-education: 8-22 mins                     Raymond Gurney, PT, DPT Acute Rehabilitation Services  Office: [phone]    Jeff [name] 10/25/2022, 10:59 AM

## 2022-10-25 NOTE — Discharge Summary (Addendum)
Stroke Discharge Summary  Patient ID: Jeff [name]   MRN: [id number]      DOB: 1965/12/06  Date of Admission: 10/21/2022 Date of Discharge: 10/25/2022  Attending Physician:  Stroke, Md, MD, Stroke MD Consultant(s):   Treatment Team:  Ladene Artist, MD  Patient's PCP:  Simone Curia, MD  DISCHARGE DIAGNOSIS:  Principal Problem: Stroke: right MCA cortical small infarcts, embolic pattern, etiology unclear, could be from cardioembolic source versus hypercoag state from advanced cancer   Active Problems: Colon cancer with metastasis Heart palpitation and tachycardia Uncontrolled diabetes Hypertension Hyperlipidemia PAD Graves' disease status post radiation Recurrent seizure   Allergies as of 10/25/2022       Reactions   Hydrocodone Hives        Medication List     STOP taking these medications    amLODipine 10 MG tablet Commonly known as: NORVASC   aspirin EC 81 MG tablet       TAKE these medications    Accu-Chek Guide test strip Generic drug: glucose blood CHECK BLOOD SUGAR THREE TIMES DAILY AS DIRECTED   Accu-Chek Softclix Lancets lancets USE  TID TO CHECK BLOOD SUGAR LEVELS   apixaban 5 MG Tabs tablet Commonly known as: ELIQUIS Take 1 tablet (5 mg total) by mouth 2 (two) times daily.   atorvastatin 80 MG tablet Commonly known as: LIPITOR Take 1 tablet (80 mg total) by mouth daily. TAKE 1 TABLET(80 MG) BY MOUTH AT BEDTIME   dapagliflozin propanediol 10 MG Tabs tablet Commonly known as: FARXIGA Take 10 mg by mouth daily.   diphenoxylate-atropine 2.5-0.025 MG tablet Commonly known as: LOMOTIL Take 1-2 tablets by mouth 4 (four) times daily as needed for diarrhea or loose stools.   docusate sodium 100 MG capsule Commonly known as: COLACE Take 100 mg by mouth 2 (two) times daily.   Fruzaqla 5 MG capsule Generic drug: fruquintinib Take 1 capsule (5 mg total) by mouth daily. Take for 21 days, then hold for 7 days. Repeat every 28 days. Start  cycle on 10/11/22 after MD visit   Global Ease Inject Pen Needles 31G X 5 MM Misc Generic drug: Insulin Pen Needle Inject 1 Syringe into the skin 4 (four) times daily.   levETIRAcetam 750 MG tablet Commonly known as: KEPPRA Take 1 tablet twice a day   levothyroxine 137 MCG tablet Commonly known as: SYNTHROID Take 137 mcg by mouth daily before breakfast.   lidocaine-prilocaine cream Commonly known as: EMLA Apply 1 Application topically as needed. Apply to portacath site 1 hour prior to use   LORazepam 0.5 MG tablet Commonly known as: ATIVAN Take 1 tablet (0.5 mg total) by mouth every 8 (eight) hours as needed for anxiety.   meclizine 25 MG tablet Commonly known as: ANTIVERT Take 25 mg by mouth 3 (three) times daily as needed for dizziness.   metFORMIN 500 MG 24 hr tablet Commonly known as: GLUCOPHAGE-XR Take 1,000 mg by mouth in the morning and at bedtime.   metoprolol tartrate 100 MG tablet Commonly known as: LOPRESSOR Take 100 mg by mouth 2 (two) times daily.   mirtazapine 15 MG tablet Commonly known as: REMERON Take 15 mg by mouth at bedtime.   nitroGLYCERIN 0.4 MG SL tablet Commonly known as: NITROSTAT DISSOLVE 1 TABLET UNDER TONGUE EVERY 5 MINUTES AS NEEDED FOR CHEST PAIN. IF NO RELIEF AFTER 3 DOSES CALL 911.   ondansetron 8 MG tablet Commonly known as: ZOFRAN Take 1 tablet (8 mg total) by mouth  every 8 (eight) hours as needed for nausea or vomiting.   oxyCODONE-acetaminophen 10-325 MG tablet Commonly known as: PERCOCET Take 1 tablet by mouth every 6 (six) hours as needed.   pantoprazole 40 MG tablet Commonly known as: PROTONIX Take 40 mg by mouth 2 (two) times daily.   potassium chloride SA 20 MEQ tablet Commonly known as: KLOR-CON M TAKE 1 TABLET BY MOUTH DAILY.   promethazine 12.5 MG tablet Commonly known as: PHENERGAN Take 1 tablet (12.5 mg total) by mouth every 6 (six) hours as needed.   sildenafil 100 MG tablet Commonly known as: VIAGRA Take  100 mg by mouth daily as needed.   tamsulosin 0.4 MG Caps capsule Commonly known as: FLOMAX Take 0.4 mg by mouth 2 (two) times daily.   tiZANidine 4 MG tablet Commonly known as: ZANAFLEX Take 4 mg by mouth 3 (three) times daily.   TRESIBA FLEXTOUCH [place] Inject 80 Units into the skin daily.   venlafaxine XR 75 MG 24 hr capsule Commonly known as: EFFEXOR-XR Take 225 mg by mouth daily.   Vitamin D (Ergocalciferol) 1.25 MG (50000 UNIT) Caps capsule Commonly known as: DRISDOL Take 50,000 Units by mouth once a week.   Xtampza ER 13.5 MG C12a Generic drug: oxyCODONE ER Take 13.5 mg by mouth every 12 (twelve) hours.        LABORATORY STUDIES CBC    Component Value Date/Time   WBC 7.8 10/25/2022 0501   RBC 4.40 10/25/2022 0501   HGB 12.2 (L) 10/25/2022 0501   HGB 11.8 (L) 10/11/2022 0833   HGB 13.8 05/03/2017 0849   HCT 38.9 (L) 10/25/2022 0501   HCT 42.6 05/03/2017 0849   PLT 145 (L) 10/25/2022 0501   PLT 124 (L) 10/11/2022 0833   PLT 143 05/03/2017 0849   MCV 88.4 10/25/2022 0501   MCV 93.4 05/03/2017 0849   MCH 27.7 10/25/2022 0501   MCHC 31.4 10/25/2022 0501   RDW 15.7 (H) 10/25/2022 0501   RDW 20.1 (H) 05/03/2017 0849   LYMPHSABS 2.7 10/21/2022 1305   LYMPHSABS 2.3 05/03/2017 0849   MONOABS 0.6 10/21/2022 1305   MONOABS 0.5 05/03/2017 0849   EOSABS 0.1 10/21/2022 1305   EOSABS 0.2 05/03/2017 0849   BASOSABS 0.0 10/21/2022 1305   BASOSABS 0.0 05/03/2017 0849   CMP    Component Value Date/Time   NA 137 10/25/2022 0501   NA 138 05/03/2017 0849   K 3.6 10/25/2022 0501   K 3.7 05/03/2017 0849   CL 100 10/25/2022 0501   CO2 27 10/25/2022 0501   CO2 25 05/03/2017 0849   GLUCOSE 111 (H) 10/25/2022 0501   GLUCOSE 185 (H) 05/03/2017 0849   BUN 15 10/25/2022 0501   BUN 10.4 05/03/2017 0849   CREATININE 1.48 (H) 10/25/2022 0501   CREATININE 1.01 10/11/2022 0833   CREATININE 1.3 05/03/2017 0849   CALCIUM 9.1 10/25/2022 0501   CALCIUM 8.6 05/03/2017 0849    PROT 6.9 10/22/2022 0151   PROT 7.5 05/03/2017 0849   ALBUMIN 3.3 (L) 10/22/2022 0151   ALBUMIN 3.8 05/03/2017 0849   AST 43 (H) 10/22/2022 0151   AST 40 10/11/2022 0833   AST 36 (H) 05/03/2017 0849   ALT 42 10/22/2022 0151   ALT 34 10/11/2022 0833   ALT 52 05/03/2017 0849   ALKPHOS 74 10/22/2022 0151   ALKPHOS 85 05/03/2017 0849   BILITOT 0.9 10/22/2022 0151   BILITOT 0.4 10/11/2022 0833   BILITOT 0.73 05/03/2017 0849   GFRNONAA 56 (L) 10/25/2022  0501   GFRNONAA >60 10/11/2022 0833   GFRAA >60 01/30/2020 0944   COAGS Lab Results  Component Value Date   INR 1.0 10/21/2022   INR 0.98 01/19/2016   INR 1.12 09/07/2015   Lipid Panel    Component Value Date/Time   CHOL 136 10/21/2022 2121   CHOL 139 04/16/2019 0842   TRIG 184 (H) 10/21/2022 2121   HDL 31 (L) 10/21/2022 2121   HDL 36 (L) 04/16/2019 0842   CHOLHDL 4.4 10/21/2022 2121   VLDL 37 10/21/2022 2121   LDLCALC 68 10/21/2022 2121   LDLCALC 68 04/16/2019 0842   HgbA1C  Lab Results  Component Value Date   HGBA1C 7.5 (H) 10/21/2022   Urinalysis    Component Value Date/Time   COLORURINE YELLOW 10/21/2022 1300   APPEARANCEUR CLEAR 10/21/2022 1300   LABSPEC 1.029 10/21/2022 1300   PHURINE 7.0 10/21/2022 1300   GLUCOSEU >=500 (A) 10/21/2022 1300   HGBUR SMALL (A) 10/21/2022 1300   BILIRUBINUR NEGATIVE 10/21/2022 1300   KETONESUR NEGATIVE 10/21/2022 1300   PROTEINUR NEGATIVE 10/21/2022 1300   UROBILINOGEN 0.2 01/06/2015 0547   NITRITE NEGATIVE 10/21/2022 1300   LEUKOCYTESUR NEGATIVE 10/21/2022 1300   Urine Drug Screen     Component Value Date/Time   LABOPIA NONE DETECTED 10/21/2022 1300   COCAINSCRNUR NONE DETECTED 10/21/2022 1300   LABBENZ NONE DETECTED 10/21/2022 1300   AMPHETMU NONE DETECTED 10/21/2022 1300   THCU NONE DETECTED 10/21/2022 1300   LABBARB NONE DETECTED 10/21/2022 1300    Alcohol Level    Component Value Date/Time   ETH <10 10/21/2022 1306     SIGNIFICANT DIAGNOSTIC STUDIES VAS  [place] LOWER EXTREMITY VENOUS (DVT)  Result Date: 10/23/2022  Lower Venous DVT Study Patient Name:  Jeff [name] [facility]  Date of Exam:   10/22/2022 Medical Rec #: [id number]         Accession #:    [id number] Date of Birth: 1966/05/10         Patient Gender: M Patient Age:   31 years Exam Location:  [facility] Procedure:      VAS [place] LOWER EXTREMITY VENOUS (DVT) Referring Phys: Scheryl Marten Zully Frane --------------------------------------------------------------------------------  Indications: Stroke.  Comparison Study: No previous study. Performing Technologist: McKayla Maag RVT, [place]  Examination Guidelines: A complete evaluation includes B-mode imaging, spectral Doppler, color Doppler, and power Doppler as needed of all accessible portions of each vessel. Bilateral testing is considered an integral part of a complete examination. Limited examinations for reoccurring indications may be performed as noted. The reflux portion of the exam is performed with the patient in reverse Trendelenburg.  +---------+---------------+---------+-----------+----------+--------------+ RIGHT    CompressibilityPhasicitySpontaneityPropertiesThrombus Aging +---------+---------------+---------+-----------+----------+--------------+ CFV      Full           Yes      Yes                                 +---------+---------------+---------+-----------+----------+--------------+ SFJ      Full                                                        +---------+---------------+---------+-----------+----------+--------------+ FV Prox  Full                                                        +---------+---------------+---------+-----------+----------+--------------+  FV Mid   Full                                                        +---------+---------------+---------+-----------+----------+--------------+ FV DistalFull                                                         +---------+---------------+---------+-----------+----------+--------------+ PFV      Full                                                        +---------+---------------+---------+-----------+----------+--------------+ POP      Full           Yes      Yes                                 +---------+---------------+---------+-----------+----------+--------------+ PTV      Full                                                        +---------+---------------+---------+-----------+----------+--------------+ PERO     Full                                                        +---------+---------------+---------+-----------+----------+--------------+   +---------+---------------+---------+-----------+----------+--------------+ LEFT     CompressibilityPhasicitySpontaneityPropertiesThrombus Aging +---------+---------------+---------+-----------+----------+--------------+ CFV      Full           Yes      Yes                                 +---------+---------------+---------+-----------+----------+--------------+ SFJ      Full                                                        +---------+---------------+---------+-----------+----------+--------------+ FV Prox  Full                                                        +---------+---------------+---------+-----------+----------+--------------+ FV Mid   Full                                                        +---------+---------------+---------+-----------+----------+--------------+  FV DistalFull                                                        +---------+---------------+---------+-----------+----------+--------------+ PFV      Full                                                        +---------+---------------+---------+-----------+----------+--------------+ POP      Full           Yes      Yes                                  +---------+---------------+---------+-----------+----------+--------------+ PTV      Full                                                        +---------+---------------+---------+-----------+----------+--------------+ PERO     Full                                                        +---------+---------------+---------+-----------+----------+--------------+     Summary: BILATERAL: - No evidence of deep vein thrombosis seen in the lower extremities, bilaterally. - No evidence of superficial venous thrombosis in the lower extremities, bilaterally. -No evidence of popliteal cyst, bilaterally.   *See table(s) above for measurements and observations. Electronically signed by Gerarda Fraction on 10/23/2022 at 3:55:06 PM.    Final    CT CHEST ABDOMEN PELVIS W CONTRAST  Result Date: 10/22/2022 CLINICAL DATA:  Metastatic colon cancer, restaging examination. *Tracking code: [id number]* EXAM: CT CHEST, ABDOMEN, AND PELVIS WITH CONTRAST TECHNIQUE: Multidetector CT imaging of the chest, abdomen and pelvis was performed following the standard protocol during bolus administration of intravenous contrast. RADIATION DOSE REDUCTION: This exam was performed according to the departmental dose-optimization program which includes automated exposure control, adjustment of the mA and/or kV according to patient size and/or use of iterative reconstruction technique. CONTRAST:  75mL OMNIPAQUE IOHEXOL 350 MG/ML SOLN COMPARISON:  08/11/2022 FINDINGS: CT CHEST FINDINGS Cardiovascular: Cardiac size within normal limits. Mild coronary artery calcification. No pericardial effusion. Central pulmonary arteries are of normal caliber. Mild atherosclerotic calcification within the thoracic aorta. No aortic aneurysm. Right internal jugular chest port tip noted within the superior vena cava. Mediastinum/Nodes: Pathologic right hilar adenopathy is stable with the index lymph node measuring 1.2 x 1.8 cm at axial image # 26/3. Progressive right  paraesophageal adenopathy with the index lymph node measuring 14 mm in short axis diameter at axial image # 32/3, previously measuring 7 mm. New pericardial adenopathy with the index lymph node measuring 2.1 x 3.5 cm at axial image # 42/3, new since prior exam. Thyroid is atrophic. Esophagus is unremarkable. Lungs/Pleura: Numerous multilobulated pulmonary metastases are stable in size and number. Index lesion within the  right middle lobe measures 2.8 x 3.6 cm at axial image # 81/4. Index lesion within the lingula measures 3.1 x 4.1 cm at axial image # 82/4. New trace right pleural effusion. No pneumothorax. No central obstructing lesion. Musculoskeletal: No focal lytic or blastic bone lesion. CT ABDOMEN PELVIS FINDINGS Hepatobiliary: Numerous hypoenhancing metastatic foci are seen within the liver bilaterally, progressive since prior examination. Index lesion within the left hepatic lobe, axial image # 48/3, measures 2.1 x 2.4 cm. Index lesion within the right hepatic lobe at axial image # 50/3 measures 1.9 x 2.0 cm. Mild hepatic steatosis. Cholelithiasis noted without pericholecystic inflammatory change. No intra or extrahepatic biliary ductal dilation. Pancreas: Unremarkable Spleen: Unremarkable Adrenals/Urinary Tract: The adrenal glands are unremarkable. The kidneys are normal in size and position. 4 mm nonobstructing calculus again noted within the interpolar region of the left kidney. No hydronephrosis. No ureteral calculi. The bladder is unremarkable. Stomach/Bowel: Status post sigmoid colectomy. The stomach, small bowel, and large bowel are otherwise unremarkable. Appendix normal. No free intraperitoneal gas or fluid. Vascular/Lymphatic: Mild aortoiliac atherosclerotic calcification. No aortic aneurysm. Pathologic periportal adenopathy has progressed with the index lymph node measuring 1.8 x 3.1 cm at axial image # 63/3. Left periaortic adenopathy appears stable measuring 1.4 x 2.3 cm at axial image # 89/3.  Reproductive: Moderate prostatic hypertrophy. Other: No abdominal wall hernia. Musculoskeletal: L4-S1 lumbar fusion with instrumentation and L5 posterior decompression has been performed. No acute bone abnormality. No lytic or blastic bone lesion. IMPRESSION: 1. Progressive disease with progressive mediastinal, pericardial, and periportal adenopathy as well as progressive hepatic metastatic disease. Stable pulmonary metastatic disease. 2. Mild coronary artery calcification. 3. Cholelithiasis. 4. Mild left nonobstructing nephrolithiasis. 5. Moderate prostatic hypertrophy. 6. Status post sigmoid colectomy. Aortic Atherosclerosis (ICD10-I70.0). Electronically Signed   By: Helyn Numbers M.D.   On: 10/22/2022 23:42   ECHOCARDIOGRAM COMPLETE  Result Date: 10/22/2022    ECHOCARDIOGRAM REPORT   Patient Name:   Jeff [name] [facility] Date of Exam: 10/22/2022 Medical Rec #:  [id number]        Height:       70.0 in Accession #:    [id number]       Weight:       213.8 lb Date of Birth:  Mar 06, 1966        BSA:          2.148 m Patient Age:    56 years         BP:           150/100 mmHg Patient Gender: M                HR:           97 bpm. Exam Location:  Inpatient Procedure: 2D Echo, Cardiac Doppler and Color Doppler Indications:    Stroke  History:        Patient has no prior history of Echocardiogram examinations.                 Stroke and PAD; Risk Factors:Hypertension, Sleep Apnea and                 Diabetes.  Sonographer:    Lucy Antigua Referring Phys: [id number] Lynnae January IMPRESSIONS  1. Left ventricular ejection fraction, by estimation, is 60 to 65%. The left ventricle has normal function. The left ventricle has no regional wall motion abnormalities. There is mild concentric left ventricular hypertrophy. Left ventricular diastolic parameters are consistent with Grade I diastolic  dysfunction (impaired relaxation).  2. Right ventricular systolic function is normal. The right ventricular size is normal. Tricuspid  regurgitation signal is inadequate for assessing PA pressure.  3. The mitral valve is grossly normal. Trivial mitral valve regurgitation. No evidence of mitral stenosis.  4. The aortic valve is tricuspid. Aortic valve regurgitation is not visualized. No aortic stenosis is present.  5. Ascending aorta not well seen. Measurments likely inaccurate. Recent CT with normal aortic measurements.  6. The inferior vena cava is normal in size with greater than 50% respiratory variability, suggesting right atrial pressure of 3 mmHg. Conclusion(s)/Recommendation(s): No intracardiac source of embolism detected on this transthoracic study. Consider a transesophageal echocardiogram to exclude cardiac source of embolism if clinically indicated. FINDINGS  Left Ventricle: Left ventricular ejection fraction, by estimation, is 60 to 65%. The left ventricle has normal function. The left ventricle has no regional wall motion abnormalities. The left ventricular internal cavity size was normal in size. There is  mild concentric left ventricular hypertrophy. Left ventricular diastolic parameters are consistent with Grade I diastolic dysfunction (impaired relaxation). Right Ventricle: The right ventricular size is normal. No increase in right ventricular wall thickness. Right ventricular systolic function is normal. Tricuspid regurgitation signal is inadequate for assessing PA pressure. Left Atrium: Left atrial size was normal in size. Right Atrium: Right atrial size was normal in size. Pericardium: Trivial pericardial effusion is present. Mitral Valve: The mitral valve is grossly normal. Trivial mitral valve regurgitation. No evidence of mitral valve stenosis. Tricuspid Valve: The tricuspid valve is grossly normal. Tricuspid valve regurgitation is trivial. No evidence of tricuspid stenosis. Aortic Valve: The aortic valve is tricuspid. Aortic valve regurgitation is not visualized. No aortic stenosis is present. Aortic valve mean gradient  measures 2.0 mmHg. Aortic valve peak gradient measures 3.5 mmHg. Aortic valve area, by VTI measures 4.01 cm. Pulmonic Valve: The pulmonic valve was grossly normal. Pulmonic valve regurgitation is not visualized. Aorta: Ascending aorta not well seen. Measurments likely inaccurate. Recent CT with normal aortic measurements. The aortic root is normal in size and structure. Venous: The inferior vena cava is normal in size with greater than 50% respiratory variability, suggesting right atrial pressure of 3 mmHg. IAS/Shunts: The atrial septum is grossly normal.  LEFT VENTRICLE PLAX 2D LVIDd:         5.00 cm   Diastology LVIDs:         3.20 cm   LV e' medial:    6.84 cm/s LV PW:         1.20 cm   LV E/e' medial:  7.4 LV IVS:        1.10 cm   LV e' lateral:   4.82 cm/s LVOT diam:     2.20 cm   LV E/e' lateral: 10.5 LV SV:         65 LV SV Index:   30 LVOT Area:     3.80 cm  RIGHT VENTRICLE RV S prime:     19.70 cm/s TAPSE (M-mode): 2.1 cm LEFT ATRIUM             Index        RIGHT ATRIUM           Index LA Vol (A2C):   29.1 ml 13.55 ml/m  RA Area:     13.50 cm LA Vol (A4C):   41.3 ml 19.23 ml/m  RA Volume:   31.00 ml  14.44 ml/m LA Biplane Vol: 37.6 ml 17.51 ml/m  AORTIC VALVE AV  Area (Vmax):    3.60 cm AV Area (Vmean):   3.56 cm AV Area (VTI):     4.01 cm AV Vmax:           93.60 cm/s AV Vmean:          64.200 cm/s AV VTI:            0.163 m AV Peak Grad:      3.5 mmHg AV Mean Grad:      2.0 mmHg LVOT Vmax:         88.60 cm/s LVOT Vmean:        60.100 cm/s LVOT VTI:          0.172 m LVOT/AV VTI ratio: 1.06  AORTA Ao Root diam: 3.80 cm Ao Asc diam:  4.40 cm MITRAL VALVE MV Area (PHT): 2.91 cm    SHUNTS MV Decel Time: 261 msec    Systemic VTI:  0.17 m MV E velocity: 50.70 cm/s  Systemic Diam: 2.20 cm MV A velocity: 60.00 cm/s MV E/A ratio:  0.85 Lennie Odor MD Electronically signed by Lennie Odor MD Signature Date/Time: 10/22/2022/3:43:42 PM    Final    MR BRAIN WO CONTRAST  Result Date: 10/22/2022 CLINICAL  DATA:  Stroke follow-up EXAM: MRI HEAD WITHOUT CONTRAST TECHNIQUE: Multiplanar, multiecho pulse sequences of the brain and surrounding structures were obtained without intravenous contrast. COMPARISON:  CT/CTA head and neck and brain MRI 1 day prior FINDINGS: This is a limited study with axial DWI and axial SWI sequences obtained. Brain: There is diffusion restriction in the right frontal lobe with involvement of the precentral gyrus and pre motor region, corona radiata, and operculum consistent with acute infarcts. The extent of the infarcts appears progressed compared to the MRI from 1 day prior. There is no hemorrhage or mass effect. Parenchymal volume is stable. The ventricles are stable in size. There is no midline shift. IMPRESSION: Limited study with only SWI and DWI sequences obtained. Acute infarcts in the right MCA distribution appear progressed in extent compared to the study from 1 day prior. No hemorrhage or mass effect. Electronically Signed   By: Lesia Hausen M.D.   On: 10/22/2022 11:38   CT ANGIO HEAD NECK W WO CM (CODE STROKE)  Result Date: 10/21/2022 CLINICAL DATA:  Neuro deficit, acute, stroke suspected EXAM: CT ANGIOGRAPHY HEAD AND NECK WITH AND WITHOUT CONTRAST TECHNIQUE: Multidetector CT imaging of the head and neck was performed using the standard protocol during bolus administration of intravenous contrast. Multiplanar CT image reconstructions and MIPs were obtained to evaluate the vascular anatomy. Carotid stenosis measurements (when applicable) are obtained utilizing NASCET criteria, using the distal internal carotid diameter as the denominator. RADIATION DOSE REDUCTION: This exam was performed according to the departmental dose-optimization program which includes automated exposure control, adjustment of the mA and/or kV according to patient size and/or use of iterative reconstruction technique. CONTRAST:  75mL OMNIPAQUE IOHEXOL 350 MG/ML SOLN COMPARISON:  CT Head 12/18/21 FINDINGS: CT  HEAD FINDINGS See same day CT brain for intracranial findings. CTA NECK FINDINGS Aortic arch: Standard branching. Imaged portion shows no evidence of aneurysm or dissection. No significant stenosis of the major arch vessel origins. Right carotid system: No evidence of dissection, stenosis (50% or greater), or occlusion. Left carotid system: No evidence of dissection, stenosis (50% or greater), or occlusion. Vertebral arteries: Codominant. No evidence of dissection, stenosis (50% or greater), or occlusion. Skeleton: Status post C3-C4 ACDF with solid osseous fusion. There is also posterior fusion  of the level. Patient is edentulous. Other neck: Negative. Upper chest: Redemonstrated right hilar lymphadenopathy, better assessed on prior CT chest abdomen and pelvis. No pleural effusion. No pneumothorax. Right chest port in place. Mild circumferential esophageal thickening Review of the MIP images confirms the above findings CTA HEAD FINDINGS Anterior circulation: No significant stenosis, proximal occlusion, aneurysm, or vascular malformation. Posterior circulation: No significant stenosis, proximal occlusion, aneurysm, or vascular malformation. Venous sinuses: As permitted by contrast timing, patent. Anatomic variants: Fetal PCAs bilaterally. Review of the MIP images confirms the above findings IMPRESSION: 1. No intracranial large vessel occlusion or significant stenosis. 2. No hemodynamically significant stenosis in the neck. 3. Redemonstrated right hilar lymphadenopathy, better assessed on prior CT chest abdomen and pelvis. 4. Mild circumferential esophageal thickening, which can be seen in the setting of esophagitis. Electronically Signed   By: Lorenza Cambridge M.D.   On: 10/21/2022 14:25   MR BRAIN W WO CONTRAST  Result Date: 10/21/2022 CLINICAL DATA:  Transient ischemic attack (TIA) Stroke, follow up Neuro deficit, acute, stroke suspected Metastatic disease evaluation. EXAM: MRI HEAD WITHOUT AND WITH CONTRAST  TECHNIQUE: Multiplanar, multiecho pulse sequences of the brain and surrounding structures were obtained without and with intravenous contrast. CONTRAST:  9mL GADAVIST GADOBUTROL 1 MMOL/ML IV SOLN COMPARISON:  Head CT 10/21/2022.  MRI brain 04/09/2020. FINDINGS: Brain: Restricted diffusion in the right precentral gyrus and pre motor area, consistent with acute infarct. No acute hemorrhage or significant mass effect. Underlying mild chronic small-vessel disease. No hydrocephalus. Small bilateral middle cranial fossa arachnoid cysts, right-greater-than-left. No mass or abnormal enhancement. No foci of abnormal susceptibility. Vascular: Normal flow voids and vessel enhancement. Skull and upper cervical spine: Partially visualized susceptibility artifact from posterior spinal fusion hardware. Otherwise, normal marrow signal and enhancement. Sinuses/Orbits: Unremarkable. Other: None. IMPRESSION: 1. Acute infarcts in the right precentral gyrus and pre motor area. 2. No evidence of intracranial metastatic disease. Electronically Signed   By: Orvan Falconer M.D.   On: 10/21/2022 14:14   CT HEAD CODE STROKE WO CONTRAST  Result Date: 10/21/2022 CLINICAL DATA:  Code stroke. Neuro deficit, acute, stroke suspected left-sided weakness EXAM: CT HEAD WITHOUT CONTRAST TECHNIQUE: Contiguous axial images were obtained from the base of the skull through the vertex without intravenous contrast. RADIATION DOSE REDUCTION: This exam was performed according to the departmental dose-optimization program which includes automated exposure control, adjustment of the mA and/or kV according to patient size and/or use of iterative reconstruction technique. COMPARISON:  CT Head 12/18/21 FINDINGS: Brain: No evidence of acute infarction, hemorrhage, hydrocephalus, extra-axial collection or mass lesion/mass effect. Enlarged and partially empty sella. Vascular: No hyperdense vessel or unexpected calcification. Skull: Normal. Negative for fracture or  focal lesion. Sinuses/Orbits: No middle ear or mastoid effusion. Paranasal sinuses clear. Orbits are unremarkable. Other: None. ASPECTS [facility] Stroke Program Early CT Score): 10 IMPRESSION: No hemorrhage or CT evidence of an acute cortical infarct. Aspects 10. Findings were paged to Dr. Derry Lory on 10/21/22 at 1:11 PM via [facility] paging system. Electronically Signed   By: Lorenza Cambridge M.D.   On: 10/21/2022 13:14      HISTORY OF PRESENT ILLNESS Jeff [name] is a 57 y.o. male with PMH significant for seizures on Keppra 750 BID, HTN, HLD, NSTEMI, Metastatic colon cancer, DM2, Hypothyroidiam, PAD who was BIB EMS as a CODE STROKE due to left hemiparesis and left facial droop. BP with EMS was elevated, over 200, CBG WNL.    He was eating lunch at 1200 when it started.  Felt a cramp in his head but not a headache and felt onset of his typical seizure. However, did not have a seizure.   On exam at bridge, patient is alert, oriented, mild drift seen in left arm, but does have decreased grip and decreased sensation in left hand, left facial droop present, slight dysarthria. Taken emergently to CT, no evidence of bleed. Due to patient history of metastatic cancer, MRI with and without contrast was ordered to evaluate for possible met lesion before discussing TPA possibility. MRI reviewed with radiologist as images became available, showed possible ischemic stroke and no lesion. Risks, benefits, alternatives of IV thrombolysis were discussed with patient and his wife, Jeff [name], over the phone, both agreed to proceed.    LKW: 1200 TNK given? Yes @ 1332 Mechanical thrombectomy? No, no LVO Premorbid modified Rankin scale (mRS): 0    HOSPITAL COURSE Jeff [name] is a 57 y.o. male with history of hypertension, diabetes, recurrent seizure on Keppra, PAD status post bypass, Graves' disease status post radiation, colon cancer on chemotherapy with liver and lung metastasis, thrombocytopenia admitted for  left-sided weakness, left facial droop.  TNK given.     Stroke:  right MCA cortical small infarcts, embolic pattern, etiology unclear, could be from cardioembolic source versus hypercoag state from advanced cancer CT no acute abnormality CT head and neck unremarkable MRI right MCA scattered cortical infarcts, no evidence of intracranial metastasis disease 2D Echo EF 60 to 65% LE venous Doppler no DVT LDL 68 HgbA1c 7.5 UDS negative Lovenox for VTE prophylaxis aspirin 81 mg daily prior to admission, on Eliquis now given cancer progression, along with intermittent heart palpitation  Patient counseled to be compliant with his antithrombotic medications Ongoing aggressive stroke risk factor management Therapy recommendations: Outpatient PT OT Disposition: Pending   Colon cancer with metastasis Diagnosed colon cancer in 2017 Status post multiple rounds of chemotherapy with long and recurrent liver metastasis Follow-up with Dr. Truett Perna at [facility] CT Progressive disease with progressive mediastinal, pericardial, and periportal adenopathy as well as progressive hepatic metastatic disease. Stable pulmonary metastatic disease. Decided on Eliquis after discussion with Dr. Truett Perna   Heart palpitation Tachycardia Per patient and wife, patient does have infrequent heart palpitation More frequent palpitation episodes in the past due to Graves' disease,  has improved after treatment Not sure if A-fib episode, but decided on Eliquis after discussion with pt and family as well as Dr. Truett Perna Resume the home metoprolol 100 twice daily -> increased to 150 twice daily->100 bid Pt will continue to follow up with his cardiologist Dr. Kirke Corin.    Diabetes HgbA1c 7.5 goal < 7.0 Uncontrolled Resume farxiga and metoprolol CBG monitoring SSI DM education and close PCP follow up   Hypertension Stable on the high side Resume home metoprolol 100 bid BP goal <180/105 Long term BP goal normotensive    Hyperlipidemia Home meds: Lipitor 80 LDL 68, goal < 70 Now on Lipitor 80 Continue statin at discharge   Other Stroke Risk Factors Obesity, Body mass index is 30.68 kg/m.  PAD status post bypass   Other Active Problems Graves' disease status post radiation, now on levothyroxine - TSH and FT4 normal Recurrent seizure, on Keppra 700 mg twice daily - follows with Dr. Karel Jarvis at [facility] Elevated LFT, AST/ALT 51/46->43/42 Cr 1.48- encourage fluids    DISCHARGE EXAM Blood pressure 107/67, pulse 89, temperature 97.8 F (36.6 C), temperature source Oral, resp. rate 16, height 5\' 10"  (1.778 m), weight 97 kg, SpO2 100 %.  General - Well nourished, well developed, in no apparent distress. Cardiovascular - Regular rhythm and rate.   Neuro - awake, alert, eyes open, orientated to age, place, year and people No aphasia, fluent language but paucity of speech, following all simple commands. Able to name and repeat. No gaze palsy, tracking bilaterally, visual field full. Left mild facial droop. Tongue midline. RUE 5/5, LUE pronator drift. Bilaterally LEs 5/5, no drift. Sensation symmetrical bilaterally, b/l FTN intact but slow on the left, gait not tested.   Discharge Diet       Diet   Diet Carb Modified Fluid consistency: Thin; Room service appropriate? Yes   liquids  DISCHARGE PLAN Disposition:  Home Eliquis twice daily for secondary stroke prevention  Ongoing stroke risk factor control by Primary Care Physician at time of discharge Follow-up PCP Simone Curia, MD in 2 weeks. Follow up with cardiology Dr. Kirke Corin in 10-14 days after discharge  Follow up with Dr. Karel Jarvis in 4 weeks after discharge. You have been referred for Outpatient PT/OT   50 minutes were spent preparing discharge.  Gevena Mart DNP, ACNPC-AG  [facility]  ATTENDING NOTE: I reviewed above note and agree with the assessment and plan. Pt was seen and examined.   Wife at the bedside.  Patient sitting on edge of  bed, and able to walk in the room and in the hallway.  Heart rate much improved, overnight 70s this morning 100s with PT and OT.  However BP decreased with metoprolol 150 twice daily.  Will decrease back to 100 twice daily.  Tolerating Eliquis well.  PT and OT recommend outpatient.  Medically ready for discharge.  Follow-up with PCP, neurology, and cardiology as outpatient.  For detailed assessment and plan, please refer to above/below as I have made changes wherever appropriate.   Neurology will sign off. Please call with questions. Pt will follow up with Dr. Karel Jarvis at [facility] in about 4 weeks. Thanks for the consult.   Marvel Plan, MD PhD Stroke Neurology 10/25/2022 7:12 PM

## 2022-10-27 ENCOUNTER — Other Ambulatory Visit: Payer: Self-pay

## 2022-11-02 ENCOUNTER — Encounter: Payer: Self-pay | Admitting: Occupational Therapy

## 2022-11-02 ENCOUNTER — Ambulatory Visit: Payer: Medicaid Other | Admitting: Occupational Therapy

## 2022-11-02 ENCOUNTER — Ambulatory Visit: Payer: Medicaid Other | Attending: Neurology | Admitting: Physical Therapy

## 2022-11-02 ENCOUNTER — Other Ambulatory Visit: Payer: Self-pay

## 2022-11-02 DIAGNOSIS — R278 Other lack of coordination: Secondary | ICD-10-CM | POA: Insufficient documentation

## 2022-11-02 DIAGNOSIS — I639 Cerebral infarction, unspecified: Secondary | ICD-10-CM | POA: Diagnosis not present

## 2022-11-02 DIAGNOSIS — M6281 Muscle weakness (generalized): Secondary | ICD-10-CM

## 2022-11-02 NOTE — Therapy (Signed)
OUTPATIENT OCCUPATIONAL THERAPY NEURO EVALUATION  Patient Name: Jeff [name] MRN: [id number] DOB:[birth year], 57 y.o., male Today's Date: 11/02/2022  PCP: Simone Curia, MD REFERRING PROVIDER: Marvel Plan, MD  END OF SESSION:   Past Medical History:  Diagnosis Date   Allergic rhinitis    Anxiety    Arthritis    Chemotherapy-induced thrombocytopenia    Chronic nausea    mild intermittant and loose stools due to colon cancer   Chronic neck and back pain    Depression    ED (erectile dysfunction)    Generalized idiopathic epilepsy and epileptic syndromes, not intractable, without status epilepticus (HCC) 10/25/2019   (11-10-2020 per pt wife last seizure 11/ 2021)  neurologist-- dr Karel Jarvis---  seizure activity/ DKA ;  EEG w/ evidence epileto genicity arising right frontocentral region;  taking keppra   GERD (gastroesophageal reflux disease)    Hiatal hernia    History of 2019 novel coronavirus disease (COVID-19) 12/13/2018   per pt wife , pt had moderate symtptoms that resolved   History of Graves' disease    s/p RAI 02/ 2012;  previous seen by endocrinologist--- dr Everardo All   History of kidney stones    History of non-ST elevation myocardial infarction (NSTEMI)    Jan 2014--  no CAD;  per notes probable coronary vasospasm   History of panic attacks    History of thyroid storm    Nov 2011 w/ grave's ,  s/p RAI 02/ 2012   Hyperlipidemia    Hypertension    Hypothyroidism following radioiodine therapy    RAI in 02/  2012---  previously followed by dr Everardo All,  now followed by pcp   Lower urinary tract symptoms (LUTS)    Metastatic colon cancer to liver (HCC) 11/2015   oncologist-- [facility]/ and seen by dr a. Maisie Fus;  01-14-2016 s/p sigmoid colectomy, liver bx, node dissection, PAC;   chemo started 09/ 2017  ;  04/ 2021 metastatic pulmonary nodules;   active treatment every 2 wks   Nephrolithiasis    left side non-obstructive per imaging 04/ 2022   PAD (peripheral artery disease)  [facility]) cardiologist-  dr Kirke Corin   cardiologist ----  dr Judie Petit. Kirke Corin ;   left popliteal occlusion behind knee w/ collaterals;  s/p attempted angioplasty 01/ 2016;  09-11-2015 s/p pop-TPT w/ ipsNR GSV   PONV (postoperative nausea and vomiting)    Rash    due to chemo   Type 2 diabetes mellitus treated with insulin (HCC)    followed by pcp----   (11-10-2020  per pt wife blood sugar check's 2-4 times daily,  fasting sugar--- 130--168)   Unspecified fracture of left foot, initial encounter for closed fracture 10/17/2020   midfoot   Past Surgical History:  Procedure Laterality Date   ABDOMINAL AORTAGRAM N/A 02/19/2014   Procedure: ABDOMINAL Ronny Flurry;  Surgeon: Iran Ouch, MD;  Location: [facility] CATH LAB;  Service: Cardiovascular;  Laterality: N/A;   ANTERIOR CERVICAL DECOMP/DISCECTOMY FUSION  09/08/2010   C3 -4   CYSTOSCOPY W/ URETERAL STENT PLACEMENT Right 01/04/2015   Procedure: CYSTOSCOPY WITH RETROGRADE PYELOGRAM/URETERAL STENT PLACEMENT;  Surgeon: Marcine Matar, MD;  Location: [facility] OR;  Service: Urology;  Laterality: Right;   CYSTOSCOPY W/ URETERAL STENT REMOVAL Right 01/26/2015   Procedure: CYSTOSCOPY WITH STENT REMOVAL;  Surgeon: Marcine Matar, MD;  Location: [facility];  Service: Urology;  Laterality: Right;   CYSTOSCOPY/URETEROSCOPY/HOLMIUM LASER/STENT PLACEMENT Right 01/26/2015   Procedure: CYSTOSCOPY/URETEROSCOPY RIGHT;  Surgeon: Marcine Matar, MD;  Location: [facility];  Service: Urology;  Laterality: Right;   FEMORAL-POPLITEAL BYPASS GRAFT Left 09/11/2015   Procedure: BYPASS GRAFT LEFT ABOVE KNEE TO BELOW KNEE POPLITEAL ARTERY WITH LEFT GREATER SAPHENOUS VEIN;  Surgeon: Nada Libman, MD;  Location: [facility] OR;  Service: Vascular;  Laterality: Left;   INCISIONAL HERNIA REPAIR N/A 01/19/2016   Procedure: HERNIA REPAIR INCISIONAL;  Surgeon: Romie Levee, MD;  Location: [facility] ORS;  Service: General;  Laterality: N/A;   LAMINECTOMY AND MICRODISCECTOMY  LUMBAR SPINE  03/26/2009   left L5 -- S1   LAPAROSCOPIC SIGMOID COLECTOMY N/A 01/14/2016   Procedure: LAPAROSCOPIC SIGMOID COLECTOMY;  Surgeon: Romie Levee, MD;  Location: [facility] ORS;  Service: General;  Laterality: N/A;   LEFT HEART CATHETERIZATION WITH CORONARY ANGIOGRAM N/A 06/12/2012   Procedure: LEFT HEART CATHETERIZATION WITH CORONARY ANGIOGRAM;  Surgeon: Wendall Stade, MD;  Location: [facility] CATH LAB;  Service: Cardiovascular;  Laterality: N/A;   No sig. CAD/  normal LVF, ef 55-65%   LIVER BIOPSY N/A 01/14/2016   Procedure: LIVER BIOPSY;  Surgeon: Romie Levee, MD;  Location: [facility]S;  Service: General;  Laterality: N/A;   LOWER EXTREMITY ANGIOGRAM Left 05/28/2014   Procedure: LOWER EXTREMITY ANGIOGRAM;  Surgeon: Iran Ouch, MD;  Location: [facility] CATH LAB;  Service: Cardiovascular;  Laterality: Left;  Failed PTA CTO   OPEN REDUCTION INTERNAL FIXATION (ORIF) FOOT LISFRANC FRACTURE Left 11/11/2020   Procedure: OPEN REDUCTION INTERNAL FIXATION (ORIF) FOOT LISFRANC FRACTURE;  Surgeon: Park Liter, DPM;  Location: [facility];  Service: Podiatry;  Laterality: Left;  General with Regional Block   POPLITEAL ARTERY ANGIOPLASTY Left 05/28/2014    dr Kirke Corin   Attempted and unsuccessful due to inability to cross the occlusionnotes    PORTACATH PLACEMENT Right 01/14/2016   Procedure: INSERTION PORT-A-CATH;  Surgeon: Romie Levee, MD;  Location: [facility];  Service: General;  Laterality: Right;   POSTERIOR CERVICAL FUSION/FORAMINOTOMY N/A 12/10/2012   Procedure: POSTERIOR CERVICAL FUSION/FORAMINOTOMY LEVEL 1;  Surgeon: Cristi Loron, MD;  Location: [facility] NEURO ORS;  Service: Neurosurgery;  Laterality: N/A;  Cervical three-four posterior cervical fusion with lateral mass screws   POSTERIOR LUMBAR FUSION  11-11-2009;   07-15-2013   L5 -- S1;   L4-- S1   SHOULDER ARTHROSCOPY Right 03/08/2004   debridement labral tear/  DCR/  acromioplasty   UMBILICAL HERNIA REPAIR  1980   VEIN HARVEST Left  09/11/2015   Procedure: LEFT GREATER SAPHENOUS VEIN HARVEST;  Surgeon: Nada Libman, MD;  Location: [facility] OR;  Service: Vascular;  Laterality: Left;   Patient Active Problem List   Diagnosis Date Noted   Acute ischemic right MCA stroke (HCC) 10/21/2022   Daytime sleepiness 08/03/2021   OSA (obstructive sleep apnea) 08/03/2021   Body mass index (BMI) 35.0-35.9, adult 01/04/2021   Adhesive capsulitis of left shoulder 06/09/2020   Body mass index (BMI) 25.0-25.9, adult 11/27/2019   DKA (diabetic ketoacidoses) 10/25/2019   Seizure (HCC) 10/25/2019   Goals of care, counseling/discussion 09/03/2019   Thoracic spine pain 03/19/2019   Diabetes (HCC) 08/10/2016   Genetic testing 04/20/2016   Port catheter in place 03/14/2016   SBO (small bowel obstruction) (HCC) 01/19/2016   Incisional hernia 01/19/2016   Metastatic colon cancer to liver (HCC) 01/14/2016   Cancer of sigmoid colon (HCC) 12/18/2015   Aftercare following surgery of the circulatory system 09/23/2015   Therapeutic opioid induced constipation 09/23/2015   Left leg claudication (HCC) 09/11/2015   Cervical post-laminectomy syndrome 08/31/2015   Cervical  radiculopathy 04/07/2015   Ureterolithiasis 01/04/2015   ED (erectile dysfunction) 12/09/2014   Spasm 07/03/2014   Hypothyroidism following radioiodine therapy 05/26/2014   PAD (peripheral artery disease) (HCC) 02/11/2014   Lumbar facet arthropathy 07/15/2013   Degeneration of lumbar intervertebral disc 06/20/2013   Backache 05/21/2013   Lumbosacral radiculitis 05/21/2013   Chest pain, mid sternal 06/12/2012   Acute non-ST segment elevation myocardial infarction [facility]) 06/11/2012   Hyperlipidemia 06/11/2012   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   Hypertension 03/16/2010    ONSET DATE: 10/21/22, Referral 10/24/2022  REFERRING DIAG: I63.9 (ICD-10-CM) - Acute ischemic stroke (HCC)  THERAPY DIAG:  No diagnosis found.  Rationale for Evaluation and Treatment:  Rehabilitation  SUBJECTIVE:   SUBJECTIVE STATEMENT: Patient reports that he still has some difficulty with L UE coordination and strength but is  Pt accompanied by: self and significant other Wife - Lavern  PERTINENT HISTORY:   Patient has history of hypertension, diabetes, recurrent seizure on Keppra, PAD status post bypass, Graves' disease status post radiation, colon cancer on chemotherapy with liver and lung metastasis, thrombocytopenia arrived to ED as a CODE STROKE on 10/21/22 due to left hemiparesis and left facial droop.   He was eating lunch when he felt a cramp in his head but not a headache and felt onset of his typical seizure. However, did not have a seizure.  On exam, patient was alert, oriented, mild drift seen in left arm, but did have decreased grip and decreased sensation in left hand, left facial droop present, slight dysarthria.  Taken emergently to CT, no evidence of bleed. Due to patient history of metastatic cancer, MRI with and without contrast was ordered to evaluate for possible met lesion before discussing TPA possibility. MRI reviewed with radiologist as images became available, showed possible ischemic stroke and no lesion.   Stroke:  right MCA cortical small infarcts, embolic pattern, etiology unclear, could be from cardioembolic source versus hypercoag state from advanced cancer   Stroke:  right MCA cortical small infarcts, embolic pattern, etiology unclear, could be from cardioembolic source versus hypercoag state from advanced cancer  Active Problems: Colon cancer with metastasis Heart palpitation and tachycardia Uncontrolled diabetes Hypertension Hyperlipidemia PAD Graves' disease status post radiation Recurrent seizure  PRECAUTIONS: Other: R chest - Port (chemo)  WEIGHT BEARING RESTRICTIONS: No  PAIN:  Are you having pain? Yes: NPRS scale: 8/10 Pain location: back, neck - chronic, hip pain new since   Pain description: hurts Aggravating factors:  everything Relieving factors: pain medication sometimes, previously got injections  FALLS: Has patient fallen in last 6 months? No  LIVING ENVIRONMENT: Lives with: lives with their spouse and grandson & 63 yo Lives in: apartment Stairs: Yes: External: 2 steps; none Has following equipment at home: None  PLOF: Independent  PATIENT GOALS: L hand coordination/strength  OBJECTIVE:   HAND DOMINANCE: Right  ADLs: Overall ADLs: Limited assistance Transfers/ambulation related to ADLs: Eating: Wife has been helping cut his food Grooming: Hasn't shaved since he's been home UB Dressing: I LB Dressing: needs help with tying shoelaces Toileting: I Bathing: Showers regularly Tub Shower transfers: Independent Equipment: none  IADLs: Shopping: Wife completes Light housekeeping: Wife completes Meal Prep: Grandson may order [employer] when he is at home while wife is working.  Previously would get fast food but did not cook on his own. Community mobility: No AE Medication management: Wife has always sorted his medication into pill box Financial management: Wife continues to pay bills etc Handwriting:  NT  MOBILITY STATUS:  Independent  POSTURE COMMENTS:  No Significant postural limitations Sitting balance:  No issues  ACTIVITY TOLERANCE: Activity tolerance: Good  FUNCTIONAL OUTCOME MEASURES: TBD  UPPER EXTREMITY ROM:    Active ROM Right eval Left eval  Shoulder flexion WNL WFL - minor end range limitations  Shoulder abduction    Shoulder adduction    Shoulder extension    Shoulder internal rotation    Shoulder external rotation    Elbow flexion    Elbow extension    Wrist flexion    Wrist extension    Wrist ulnar deviation    Wrist radial deviation    Wrist pronation    Wrist supination    (Blank rows = not tested)  UPPER EXTREMITY MMT:     MMT Right eval Left eval  Shoulder flexion 5/5 4/5  Shoulder abduction    Shoulder adduction    Shoulder extension     Shoulder internal rotation    Shoulder external rotation    Middle trapezius    Lower trapezius    Elbow flexion  4/5  Elbow extension    Wrist flexion    Wrist extension    Wrist ulnar deviation    Wrist radial deviation    Wrist pronation    Wrist supination    (Blank rows = not tested)  HAND FUNCTION: Grip strength: Right: 85.3  lbs; Left: 37.2  lbs  COORDINATION: 9 Hole Peg test: Right: 20.51 sec; Left: 3 pegs in 60 sec Box and Blocks:  Right 44 blocks, Left 28 blocks  SENSATION: Light touch: Impaired  Patient reports some minor changes in L long/ring finger tip.  EDEMA: NA  MUSCLE TONE: WFL  COGNITION: Overall cognitive status: Within functional limits for tasks assessed  VISION: Subjective report: Reports needing an eye Dr appt due to h/o diabetes but needs to set that up. Baseline vision: Bifocals and wears them every once in awhile but not all the time Visual history: NA but has DM and needs follow up  VISION ASSESSMENT: Tracking/Visual pursuits: Able to track stimulus in all quads with minimal difficulty  Patient reports no difficulty with activities due to visual impairments  PERCEPTION: Not tested  PRAXIS: Not tested  OBSERVATIONS: Patient is a slender AA male with no AE for mobility today who presented for OT and PT evaluations with his wife.  He is well groomed, although he reports he has not shaved since he got home from the hospital.   TODAY'S TREATMENT:                                                                                                                              DATE: 11/02/22   Therapeutic Exercises - introduced the following putty exercises to improve grip strength and fine motor skills with demonstration and guidance to work on increasing isolated finger movements to rotate putty ball/log in his hand and for pinch motion needed to manage pegs etc.   -  Putty Squeezes  - 2 x daily - 2 sets - 10 reps - 3-Point Pinch with Putty  - 2  x daily - 2 sets - 10 reps - Finger Pinch and Pull with Putty  - 2 x daily - 2 sets - 10 reps - Tip PUSH with Putty  - 2 x daily - 2 sets - 10 reps  PATIENT EDUCATION: Education details: OT POC, Putty Exercises Person educated: Patient and Spouse Education method: Explanation, Demonstration, Tactile cues, Verbal cues, and Handouts Education comprehension: verbalized understanding, returned demonstration, verbal cues required, tactile cues required, and needs further education  HOME EXERCISE PROGRAM: Date: 11/02/2022 Putty Exercises Access Code: [email or web address] URL: [email or web address] Prepared by: Amada Kingfisher   GOALS: Goals reviewed with patient? Yes  SHORT TERM GOALS: Target date: 11/25/22  Patient will demonstrate at least 45 lbs LUE grip strength as needed to open jars and other containers.  Baseline: 37.2 lbs Goal status: INITIAL  2.  Patient will fill nine-hole peg with use of LUE in 2 minutes seconds or less.  Baseline: 3 pegs in 1 minute Goal status: INITIAL  3.  Patient will demonstrate UE grip/coordination HEP with 25% verbal cues or less for proper execution.  Baseline: Initiated education at [employer] Goal status: INITIAL  4.  Pt will be able to place at least 33 blocks using left hand with completion of Box and Blocks test.  Baseline: 27 Blocks Goal status: INITIAL   LONG TERM GOALS: Target date: 12/16/22  Patient will demonstrate at least 50 lbs LUE grip strength as needed to open jars and other containers.  Baseline: 37.2 lbs Goal status: INITIAL  2.  Patient will place pegs in nine-hole pegboard with LUE 1 minute or less.  Baseline: 3 pegs in 1 minute Goal status: INITIAL  3.  Patient will improve motor coordination to tie his shoelaces. Baseline: Dependent on wife Goal status: INITIAL  4.  Patient will demonstrate UE HEP with visual handouts and no cues for proper execution.  Baseline: Initiated Putty exercises at evaluation Goal status:  INITIAL   ASSESSMENT:  CLINICAL IMPRESSION: Patient is a 57 y.o. AA male who was seen today for occupational therapy evaluation for LUE deficits s/p recent CVA.  He has had some good return of LUE ROM with more deficits in L hand versus shoulder/elbow.  He has some weakness still in LUE grip and significant difficulty with FM task of pegboard versus gross grasp of blocks. Patient currently presents below baseline level of function with ADLs r/t FMS and would benefit from skilled OT services in the outpatient setting to work on incoordination and grip strength to help pt return to PLOF as able.     PERFORMANCE DEFICITS: in functional skills including ADLs, IADLs, coordination, dexterity, sensation, ROM, strength, flexibility, Fine motor control, decreased knowledge of use of DME, and UE functional use,   IMPAIRMENTS: are limiting patient from ADLs, IADLs, and leisure.   CO-MORBIDITIES: has co-morbidities such as cancer  that affects occupational performance. Patient will benefit from skilled OT to address above impairments and improve overall function.  MODIFICATION OR ASSISTANCE TO COMPLETE EVALUATION: No modification of tasks or assist necessary to complete an evaluation.  OT OCCUPATIONAL PROFILE AND HISTORY: Problem focused assessment: Including review of records relating to presenting problem.  CLINICAL DECISION MAKING: LOW - limited treatment options, no task modification necessary  REHAB POTENTIAL: Excellent  EVALUATION COMPLEXITY: Low    PLAN:  OT FREQUENCY: 1x/week  OT DURATION: 6 weeks  PLANNED INTERVENTIONS:  self care/ADL training, therapeutic exercise, therapeutic activity, neuromuscular re-education, patient/family education, coping strategies training, and DME and/or AE instructions  RECOMMENDED OTHER SERVICES: Patient is being evaluated for physical therapy today.  CONSULTED AND AGREED WITH PLAN OF CARE: Patient and family member/caregiver  PLAN FOR NEXT SESSION:  Review and update Putty exercises and introduce coordination activities.   Victorino Sparrow, OT 11/02/2022, 8:38 AM

## 2022-11-02 NOTE — Therapy (Signed)
OUTPATIENT PHYSICAL THERAPY NEURO EVALUATION   Patient Name: Jeff [name] MRN: [id number] DOB:March 28, 1966, 57 y.o., male Today's Date: 11/02/2022   PCP: Simone Curia, MD REFERRING PROVIDER: Marvel Plan, MD  END OF SESSION:  PT End of Session - 11/02/22 1535     Visit Number 1    Number of Visits 1   eval only   Date for PT Re-Evaluation 11/02/22    Authorization Type Medicaid    PT Start Time 1535    PT Stop Time 1604   eval   PT Time Calculation (min) 29 min    Activity Tolerance Patient tolerated treatment well    Behavior During Therapy WFL for tasks assessed/performed             Past Medical History:  Diagnosis Date   Allergic rhinitis    Anxiety    Arthritis    Chemotherapy-induced thrombocytopenia    Chronic nausea    mild intermittant and loose stools due to colon cancer   Chronic neck and back pain    Depression    ED (erectile dysfunction)    Generalized idiopathic epilepsy and epileptic syndromes, not intractable, without status epilepticus (HCC) 10/25/2019   (11-10-2020 per pt wife last seizure 11/ 2021)  neurologist-- dr Karel Jarvis---  seizure activity/ DKA ;  EEG w/ evidence epileto genicity arising right frontocentral region;  taking keppra   GERD (gastroesophageal reflux disease)    Hiatal hernia    History of 2019 novel coronavirus disease (COVID-19) 12/13/2018   per pt wife , pt had moderate symtptoms that resolved   History of Graves' disease    s/p RAI 02/ 2012;  previous seen by endocrinologist--- dr Everardo All   History of kidney stones    History of non-ST elevation myocardial infarction (NSTEMI)    Jan 2014--  no CAD;  per notes probable coronary vasospasm   History of panic attacks    History of thyroid storm    Nov 2011 w/ grave's ,  s/p RAI 02/ 2012   Hyperlipidemia    Hypertension    Hypothyroidism following radioiodine therapy    RAI in 02/  2012---  previously followed by dr Everardo All,  now followed by pcp   Lower urinary tract symptoms  (LUTS)    Metastatic colon cancer to liver (HCC) 11/2015   oncologist-- [facility]/ and seen by dr a. Maisie Fus;  01-14-2016 s/p sigmoid colectomy, liver bx, node dissection, PAC;   chemo started 09/ 2017  ;  04/ 2021 metastatic pulmonary nodules;   active treatment every 2 wks   Nephrolithiasis    left side non-obstructive per imaging 04/ 2022   PAD (peripheral artery disease) [facility]) cardiologist-  dr Kirke Corin   cardiologist ----  dr Judie Petit. Kirke Corin ;   left popliteal occlusion behind knee w/ collaterals;  s/p attempted angioplasty 01/ 2016;  09-11-2015 s/p pop-TPT w/ ipsNR GSV   PONV (postoperative nausea and vomiting)    Rash    due to chemo   Type 2 diabetes mellitus treated with insulin (HCC)    followed by pcp----   (11-10-2020  per pt wife blood sugar check's 2-4 times daily,  fasting sugar--- 130--168)   Unspecified fracture of left foot, initial encounter for closed fracture 10/17/2020   midfoot   Past Surgical History:  Procedure Laterality Date   ABDOMINAL AORTAGRAM N/A 02/19/2014   Procedure: ABDOMINAL Ronny Flurry;  Surgeon: Iran Ouch, MD;  Location[facility] CATH LAB;  Service: Cardiovascular;  Laterality: N/A;   ANTERIOR CERVICAL  DECOMP/DISCECTOMY FUSION  09/08/2010   C3 -4   CYSTOSCOPY W/ URETERAL STENT PLACEMENT Right 01/04/2015   Procedure: CYSTOSCOPY WITH RETROGRADE PYELOGRAM/URETERAL STENT PLACEMENT;  Surgeon: Marcine Matar, MD;  Location: [facility] OR;  Service: Urology;  Laterality: Right;   CYSTOSCOPY W/ URETERAL STENT REMOVAL Right 01/26/2015   Procedure: CYSTOSCOPY WITH STENT REMOVAL;  Surgeon: Marcine Matar, MD;  Location: [facility];  Service: Urology;  Laterality: Right;   CYSTOSCOPY/URETEROSCOPY/HOLMIUM LASER/STENT PLACEMENT Right 01/26/2015   Procedure: CYSTOSCOPY/URETEROSCOPY RIGHT;  Surgeon: Marcine Matar, MD;  Location: [facility];  Service: Urology;  Laterality: Right;   FEMORAL-POPLITEAL BYPASS GRAFT Left 09/11/2015   Procedure: BYPASS  GRAFT LEFT ABOVE KNEE TO BELOW KNEE POPLITEAL ARTERY WITH LEFT GREATER SAPHENOUS VEIN;  Surgeon: Nada Libman, MD;  Location: [facility] OR;  Service: Vascular;  Laterality: Left;   INCISIONAL HERNIA REPAIR N/A 01/19/2016   Procedure: HERNIA REPAIR INCISIONAL;  Surgeon: Romie Levee, MD;  Location: [facility] ORS;  Service: General;  Laterality: N/A;   LAMINECTOMY AND MICRODISCECTOMY LUMBAR SPINE  03/26/2009   left L5 -- S1   LAPAROSCOPIC SIGMOID COLECTOMY N/A 01/14/2016   Procedure: LAPAROSCOPIC SIGMOID COLECTOMY;  Surgeon: Romie Levee, MD;  Location: [facility] ORS;  Service: General;  Laterality: N/A;   LEFT HEART CATHETERIZATION WITH CORONARY ANGIOGRAM N/A 06/12/2012   Procedure: LEFT HEART CATHETERIZATION WITH CORONARY ANGIOGRAM;  Surgeon: Wendall Stade, MD;  Location: [facility] CATH LAB;  Service: Cardiovascular;  Laterality: N/A;   No sig. CAD/  normal LVF, ef 55-65%   LIVER BIOPSY N/A 01/14/2016   Procedure: LIVER BIOPSY;  Surgeon: Romie Levee, MD;  Location: [facility] ORS;  Service: General;  Laterality: N/A;   LOWER EXTREMITY ANGIOGRAM Left 05/28/2014   Procedure: LOWER EXTREMITY ANGIOGRAM;  Surgeon: Iran Ouch, MD;  Location: [facility] CATH LAB;  Service: Cardiovascular;  Laterality: Left;  Failed PTA CTO   OPEN REDUCTION INTERNAL FIXATION (ORIF) FOOT LISFRANC FRACTURE Left 11/11/2020   Procedure: OPEN REDUCTION INTERNAL FIXATION (ORIF) FOOT LISFRANC FRACTURE;  Surgeon: Park Liter, DPM;  Location: [facility];  Service: Podiatry;  Laterality: Left;  General with Regional Block   POPLITEAL ARTERY ANGIOPLASTY Left 05/28/2014    dr Kirke Corin   Attempted and unsuccessful due to inability to cross the occlusionnotes    PORTACATH PLACEMENT Right 01/14/2016   Procedure: INSERTION PORT-A-CATH;  Surgeon: Romie Levee, MD;  Location: [facility] ORS;  Service: General;  Laterality: Right;   POSTERIOR CERVICAL FUSION/FORAMINOTOMY N/A 12/10/2012   Procedure: POSTERIOR CERVICAL FUSION/FORAMINOTOMY LEVEL 1;  Surgeon:  Cristi Loron, MD;  Location: [facility] NEURO ORS;  Service: Neurosurgery;  Laterality: N/A;  Cervical three-four posterior cervical fusion with lateral mass screws   POSTERIOR LUMBAR FUSION  11-11-2009;   07-15-2013   L5 -- S1;   L4-- S1   SHOULDER ARTHROSCOPY Right 03/08/2004   debridement labral tear/  DCR/  acromioplasty   UMBILICAL HERNIA REPAIR  1980   VEIN HARVEST Left 09/11/2015   Procedure: LEFT GREATER SAPHENOUS VEIN HARVEST;  Surgeon: Nada Libman, MD;  Location: [facility] OR;  Service: Vascular;  Laterality: Left;   Patient Active Problem List   Diagnosis Date Noted   Acute ischemic right MCA stroke (HCC) 10/21/2022   Daytime sleepiness 08/03/2021   OSA (obstructive sleep apnea) 08/03/2021   Body mass index (BMI) 35.0-35.9, adult 01/04/2021   Adhesive capsulitis of left shoulder 06/09/2020   Body mass index (BMI) 25.0-25.9, adult 11/27/2019   DKA (diabetic ketoacidoses) 10/25/2019   Seizure (  HCC) 10/25/2019   Goals of care, counseling/discussion 09/03/2019   Thoracic spine pain 03/19/2019   Diabetes (HCC) 08/10/2016   Genetic testing 04/20/2016   Port catheter in place 03/14/2016   SBO (small bowel obstruction) (HCC) 01/19/2016   Incisional hernia 01/19/2016   Metastatic colon cancer to liver (HCC) 01/14/2016   Cancer of sigmoid colon (HCC) 12/18/2015   Aftercare following surgery of the circulatory system 09/23/2015   Therapeutic opioid induced constipation 09/23/2015   Left leg claudication (HCC) 09/11/2015   Cervical post-laminectomy syndrome 08/31/2015   Cervical radiculopathy 04/07/2015   Ureterolithiasis 01/04/2015   ED (erectile dysfunction) 12/09/2014   Spasm 07/03/2014   Hypothyroidism following radioiodine therapy 05/26/2014   PAD (peripheral artery disease) (HCC) 02/11/2014   Lumbar facet arthropathy 07/15/2013   Degeneration of lumbar intervertebral disc 06/20/2013   Backache 05/21/2013   Lumbosacral radiculitis 05/21/2013   Chest pain, mid sternal  06/12/2012   Acute non-ST segment elevation myocardial infarction (HCC) 06/11/2012   Hyperlipidemia 06/11/2012   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   Hypertension 03/16/2010    ONSET DATE: 10/24/2022  REFERRING DIAG: I63.9 (ICD-10-CM) - Acute ischemic stroke (HCC)  THERAPY DIAG:  Muscle weakness (generalized)  Rationale for Evaluation and Treatment: Rehabilitation  SUBJECTIVE:                                                                                                                                                                                             SUBJECTIVE STATEMENT: "Jeff [name]"  Pt feels like he is doing well since leaving the hospital, his wife reports that he is doing much better.Pt reports he does have impaired endurance and not as much energy, he tried to go out to the store the other day thinking things would be just like they were before his stroke and he got worn out.  Pt accompanied by: significant other wife Laverne  PERTINENT HISTORY:  history of hypertension, HLD, NSTEMI, DM2, recurrent seizure on Keppra, PAD status post bypass, Graves' disease status post radiation, metastatic colon cancer on chemotherapy with liver and lung metastasis, thrombocytopenia   PAIN:  Are you having pain? Yes: NPRS scale: not rated/10 Pain location: chronic back pain and subacute hip pain Pain description: soreness, intermittent Aggravating factors: sometimes notices it when walking Relieving factors: rest  PRECAUTIONS: Fall  WEIGHT BEARING RESTRICTIONS: No  FALLS: Has patient fallen in last 6 months? No  LIVING ENVIRONMENT: Lives with: lives with their spouse Lives in: House/apartment Stairs: Yes: External: 2 steps; none (no issues) Has following equipment at home: None  PLOF: Independent with gait and Independent with transfers  PATIENT GOALS: "to get the  free shirt"  OBJECTIVE:   DIAGNOSTIC FINDINGS:  Brain MRI 10/21/22 IMPRESSION: 1. Acute infarcts in  the right precentral gyrus and pre motor area. 2. No evidence of intracranial metastatic disease.  COGNITION: Overall cognitive status: Within functional limits for tasks assessed   SENSATION: Some tingling in L hand and L foot and L lower limb Light touch WFL  COORDINATION: slightly slowed movements  POSTURE: No Significant postural limitations  LOWER EXTREMITY ROM:     Active  Right Eval Left Eval  Hip flexion    Hip extension    Hip abduction    Hip adduction    Hip internal rotation    Hip external rotation    Knee flexion    Knee extension Tight HS Tight HS  Ankle dorsiflexion    Ankle plantarflexion    Ankle inversion    Ankle eversion     (Blank rows = not tested)  LOWER EXTREMITY MMT:    MMT Right Eval Left Eval  Hip flexion 5 5  Hip extension    Hip abduction    Hip adduction    Hip internal rotation    Hip external rotation    Knee flexion 5 5  Knee extension 5 5  Ankle dorsiflexion 5 5  Ankle plantarflexion    Ankle inversion    Ankle eversion    (Blank rows = not tested)  BED MOBILITY:  Mod I per pt report  TRANSFERS: Assistive device utilized: None  Sit to stand: Complete Independence Stand to sit: Complete Independence Chair to chair: Complete Independence Floor:  not assessed  STAIRS: Level of Assistance: Modified independence Stair Negotiation Technique: Alternating Pattern  with No Rails Number of Stairs: 4  Height of Stairs: 6  Comments: WFL  GAIT: Gait pattern: WFL Distance walked: various clinic distances Assistive device utilized: None Level of assistance: Complete Independence Comments: WFL  FUNCTIONAL TESTS:   [facility] PT Assessment - 11/02/22 1545       Ambulation/Gait   Gait velocity 32.8 ft over 8.81 sec = 3.72 ft/sec      6 minute walk test results    Aerobic Endurance Distance Walked 1430   5/10 RPE     Standardized Balance Assessment   Standardized Balance Assessment Timed Up and Go Test;Five Times Sit to  Stand    Five times sit to stand comments  7.72 sec   no UE support     Timed Up and Go Test   TUG Normal TUG    Normal TUG (seconds) 6.15   no AD     Functional Gait  Assessment   Gait assessed  Yes    Gait Level Surface Walks 20 ft in less than 5.5 sec, no assistive devices, good speed, no evidence for imbalance, normal gait pattern, deviates no more than 6 in outside of the 12 in walkway width.    Change in Gait Speed Able to smoothly change walking speed without loss of balance or gait deviation. Deviate no more than 6 in outside of the 12 in walkway width.    Gait with Horizontal Head Turns Performs head turns smoothly with no change in gait. Deviates no more than 6 in outside 12 in walkway width    Gait with Vertical Head Turns Performs head turns with no change in gait. Deviates no more than 6 in outside 12 in walkway width.    Gait and Pivot Turn Pivot turns safely within 3 sec and stops quickly with no loss of balance.  Step Over Obstacle Is able to step over 2 stacked shoe boxes taped together (9 in total height) without changing gait speed. No evidence of imbalance.    Gait with Narrow Base of Support Is able to ambulate for 10 steps heel to toe with no staggering.    Gait with Eyes Closed Walks 20 ft, uses assistive device, slower speed, mild gait deviations, deviates 6-10 in outside 12 in walkway width. Ambulates 20 ft in less than 9 sec but greater than 7 sec.    Ambulating Backwards Walks 20 ft, no assistive devices, good speed, no evidence for imbalance, normal gait    Steps Alternating feet, no rail.    Total Score 29    FGA comment: 29/30              TODAY'S TREATMENT:                                                                                                                              PT Evaluation    PATIENT EDUCATION: Education details: Eval findings, results of OM and functional implications Person educated: Patient and Spouse Education method:  Explanation Education comprehension: verbalized understanding  HOME EXERCISE PROGRAM: N/A   ASSESSMENT:  CLINICAL IMPRESSION: Patient is a 57 year old male referred to Neuro OPPT for acute ischemic stroke.   Pt's PMH is significant for: history of hypertension, HLD, NSTEMI, DM2, recurrent seizure on Keppra, PAD status post bypass, Graves' disease status post radiation, metastatic colon cancer on chemotherapy with liver and lung metastasis, thrombocytopenia. Based on results of functional outcome measures performed during evaluation pt is [facility] for LE strength, balance, gait speed, and endurance. Pt does not require OPPT services at this time.   CLINICAL DECISION MAKING: Stable/uncomplicated  EVALUATION COMPLEXITY: Low  PLAN:  PT FREQUENCY: one time visit    Peter Congo, PT, DPT, CSRS 11/02/2022, 4:20 PM

## 2022-11-02 NOTE — Patient Instructions (Signed)
Introduction to Putty Exercises  Access Code: [email or web address] URL: [email or web address] Date: 11/02/2022 Prepared by: Amada Kingfisher  Exercises - Putty Squeezes  - 2 x daily - 2 sets - 10 reps - 3-Point Pinch with Putty  - 2 x daily - 2 sets - 10 reps - Finger Pinch and Pull with Putty  - 2 x daily - 2 sets - 10 reps - Tip PUSH with Putty  - 2 x daily - 2 sets - 10 reps

## 2022-11-07 ENCOUNTER — Inpatient Hospital Stay: Payer: Medicaid Other | Admitting: Oncology

## 2022-11-07 ENCOUNTER — Encounter: Payer: Self-pay | Admitting: Oncology

## 2022-11-07 ENCOUNTER — Inpatient Hospital Stay: Payer: Medicaid Other | Attending: Oncology

## 2022-11-07 ENCOUNTER — Other Ambulatory Visit: Payer: Self-pay

## 2022-11-07 ENCOUNTER — Inpatient Hospital Stay: Payer: Medicaid Other

## 2022-11-07 VITALS — BP 124/80 | HR 100 | Temp 98.1°F | Resp 18 | Ht 70.0 in | Wt 211.6 lb

## 2022-11-07 DIAGNOSIS — Z7901 Long term (current) use of anticoagulants: Secondary | ICD-10-CM | POA: Diagnosis not present

## 2022-11-07 DIAGNOSIS — F418 Other specified anxiety disorders: Secondary | ICD-10-CM | POA: Diagnosis not present

## 2022-11-07 DIAGNOSIS — C787 Secondary malignant neoplasm of liver and intrahepatic bile duct: Secondary | ICD-10-CM | POA: Diagnosis present

## 2022-11-07 DIAGNOSIS — I10 Essential (primary) hypertension: Secondary | ICD-10-CM | POA: Insufficient documentation

## 2022-11-07 DIAGNOSIS — Z9221 Personal history of antineoplastic chemotherapy: Secondary | ICD-10-CM | POA: Insufficient documentation

## 2022-11-07 DIAGNOSIS — C187 Malignant neoplasm of sigmoid colon: Secondary | ICD-10-CM | POA: Diagnosis present

## 2022-11-07 DIAGNOSIS — Z8673 Personal history of transient ischemic attack (TIA), and cerebral infarction without residual deficits: Secondary | ICD-10-CM | POA: Diagnosis not present

## 2022-11-07 LAB — CBC WITH DIFFERENTIAL (CANCER CENTER ONLY)
Abs Immature Granulocytes: 0.04 10*3/uL (ref 0.00–0.07)
Basophils Absolute: 0 10*3/uL (ref 0.0–0.1)
Basophils Relative: 0 %
Eosinophils Absolute: 0.1 10*3/uL (ref 0.0–0.5)
Eosinophils Relative: 1 %
HCT: 33.8 % — ABNORMAL LOW (ref 39.0–52.0)
Hemoglobin: 10.7 g/dL — ABNORMAL LOW (ref 13.0–17.0)
Immature Granulocytes: 0 %
Lymphocytes Relative: 30 %
Lymphs Abs: 2.7 10*3/uL (ref 0.7–4.0)
MCH: 28.2 pg (ref 26.0–34.0)
MCHC: 31.7 g/dL (ref 30.0–36.0)
MCV: 88.9 fL (ref 80.0–100.0)
Monocytes Absolute: 0.6 10*3/uL (ref 0.1–1.0)
Monocytes Relative: 6 %
Neutro Abs: 5.8 10*3/uL (ref 1.7–7.7)
Neutrophils Relative %: 63 %
Platelet Count: 147 10*3/uL — ABNORMAL LOW (ref 150–400)
RBC: 3.8 MIL/uL — ABNORMAL LOW (ref 4.22–5.81)
RDW: 15.2 % (ref 11.5–15.5)
WBC Count: 9.2 10*3/uL (ref 4.0–10.5)
nRBC: 0 % (ref 0.0–0.2)

## 2022-11-07 LAB — CMP (CANCER CENTER ONLY)
ALT: 30 U/L (ref 0–44)
AST: 30 U/L (ref 15–41)
Albumin: 3.8 g/dL (ref 3.5–5.0)
Alkaline Phosphatase: 78 U/L (ref 38–126)
Anion gap: 10 (ref 5–15)
BUN: 11 mg/dL (ref 6–20)
CO2: 27 mmol/L (ref 22–32)
Calcium: 9.2 mg/dL (ref 8.9–10.3)
Chloride: 100 mmol/L (ref 98–111)
Creatinine: 1.16 mg/dL (ref 0.61–1.24)
GFR, Estimated: >60 mL/min (ref >60)
Glucose, Bld: 172 mg/dL — ABNORMAL HIGH (ref 70–99)
Potassium: 3.9 mmol/L (ref 3.5–5.1)
Sodium: 137 mmol/L (ref 135–145)
Total Bilirubin: 0.5 mg/dL (ref 0.3–1.2)
Total Protein: 7.6 g/dL (ref 6.5–8.1)

## 2022-11-07 LAB — TOTAL PROTEIN, URINE DIPSTICK: Protein, ur: 30 mg/dL — AB

## 2022-11-07 LAB — MAGNESIUM: Magnesium: 1.5 mg/dL — ABNORMAL LOW (ref 1.7–2.4)

## 2022-11-07 LAB — CEA (ACCESS): CEA (CHCC): 124.29 ng/mL — ABNORMAL HIGH (ref 0.00–5.00)

## 2022-11-07 NOTE — Progress Notes (Signed)
DISCONTINUE ON PATHWAY REGIMEN - Colorectal     A cycle is every 14 days:     Panitumumab      Irinotecan   **Always confirm dose/schedule in your pharmacy ordering system**  REASON: Disease Progression PRIOR TREATMENT: [id number]: Irinotecan + Panitumumab q14 Days TREATMENT RESPONSE: Partial Response (PR)  START OFF PATHWAY REGIMEN - Colorectal   [id number] (Leucovorin IV D1 + Fluorouracil IV D1/CIV D1,2 + Oxaliplatin IV D1) q14 Days:   A cycle is every 14 days:     Oxaliplatin      Leucovorin      Fluorouracil      Fluorouracil   **Always confirm dose/schedule in your pharmacy ordering system**  Patient Characteristics: Distant Metastases, Nonsurgical Candidate, KRAS/NRAS Wild-Type (BRAF V600 Wild-Type/Unknown), Standard Cytotoxic Therapy, Fourth Line and Beyond Standard Cytotoxic Therapy Tumor Location: Colon Therapeutic Status: Distant Metastases Microsatellite/Mismatch Repair Status: MSS/pMMR BRAF Mutation Status: Wild-Type (no mutation) KRAS/NRAS Mutation Status: Wild-Type (no mutation) Preferred Therapy Approach: Standard Cytotoxic Therapy Standard Cytotoxic Line of Therapy: Fourth Line and Beyond Standard Cytotoxic Therapy Intent of Therapy: Non-Curative / Palliative Intent, Discussed with Patient

## 2022-11-07 NOTE — Progress Notes (Signed)
[facility] OFFICE PROGRESS NOTE   Diagnosis: Colon cancer  INTERVAL HISTORY:   Mr. Coufal was discharged in the hospital 10/25/2022 after admission with a CVA.  He reports his neurologic status has returned to normal aside from mild numbness in the left hand.  He is on apixaban anticoagulation.  No bleeding.  No other complaint.  Mr. Tallman is here with his wife.  Objective:  Vital signs in last 24 hours:  Blood pressure 124/80, pulse 100, temperature 98.1 F (36.7 C), temperature source Oral, resp. rate 18, height 5\' 10"  (1.778 m), weight 211 lb 9.6 oz (96 kg), SpO2 100 %.    Resp: Lungs clear bilaterally Cardio: Regular rate and rhythm GI: No hepatosplenomegaly Vascular: No leg edema Neuro: Mild decrease in grip strength at the left hand    Portacath/PICC-without erythema  Lab Results:  Lab Results  Component Value Date   WBC 9.2 11/07/2022   HGB 10.7 (L) 11/07/2022   HCT 33.8 (L) 11/07/2022   MCV 88.9 11/07/2022   PLT 147 (L) 11/07/2022   NEUTROABS 5.8 11/07/2022    CMP  Lab Results  Component Value Date   NA 137 11/07/2022   K 3.9 11/07/2022   CL 100 11/07/2022   CO2 27 11/07/2022   GLUCOSE 172 (H) 11/07/2022   BUN 11 11/07/2022   CREATININE 1.16 11/07/2022   CALCIUM 9.2 11/07/2022   PROT 7.6 11/07/2022   ALBUMIN 3.8 11/07/2022   AST 30 11/07/2022   ALT 30 11/07/2022   ALKPHOS 78 11/07/2022   BILITOT 0.5 11/07/2022   GFRNONAA >60 11/07/2022   GFRAA >60 01/30/2020    Lab Results  Component Value Date   CEA1 3.19 09/10/2020   CEA 124.29 (H) 11/07/2022     Medications: I have reviewed the patient's current medications.  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, biopsy confirmed adenocarcinoma         CTs chest, abdomen, and pelvis on 12/11/2015-indeterminate tiny pulmonary nodules, multiple liver metastases, small nodes in the sigmoid mesocolon Laparoscopic  sigmoid colectomy, liver biopsy, Port-A-Cath placement 01/14/2016 Pathology sigmoid colon resection- colonic adenocarcinoma, 5.3 cm extending into pericolonic connective tissue, positive lymph-vascular invasion, positive perineural invasion, negative margins, metastatic carcinoma in 9 of 28 lymph nodes Pathology liver biopsy-metastatic colorectal adenocarcinoma MSI stable; mismatch repair protein normal APC alteration, K RAS wild-type, no BRAF mutation Cycle 1 FOLFOX 02/02/2016 Cycle 2 FOLFOX 02/15/2016 Cycle 3 FOLFOX 02/29/2016 Cycle 4 FOLFOX 03/14/2016 Cycle 5 FOLFOX 03/28/2016 Cycle 6 FOLFOX 04/11/2016 (oxaliplatin held secondary to thrombocytopenia) 04/21/2016 restaging CTs-difficulty evaluating liver lesions due to hepatic steatosis. Stable right adrenal nodule. No adenopathy or local recurrence near the rectosigmoid anastomotic site. Cycle 7 FOLFOX 04/25/2016 MRI liver 05/02/2016-partial improvement in hepatic metastases Cycle 8 FOLFOX 05/10/2016 Cycle 9 FOLFOX 05/23/2016 (oxaliplatin held due to thrombocytopenia) Cycle 10 FOLFOX 06/06/2016 (oxaliplatin dose reduced due to thrombocytopenia) Cycle 11 FOLFOX 06/27/2016 (oxaliplatin held due to neuropathy) Cycle 12 FOLFOX 07/11/2016 (oxaliplatin held) Initiation of maintenance Xeloda 7 days on/7 days off 07/27/2016 MRI liver 11/18/2016-decrease in hepatic metastatic disease. No new or progressive disease identified within the abdomen. Continuation of Xeloda 7 days on/7 days off MRI liver 04/27/2017-previous liver lesions not identified, no new lesions, no lymphadenopathy Xeloda continued 7 days on/7 days off MRI liver 12/04/2017 - no evidence of metastatic disease, hepatic steatosis Xeloda continued 7 days on/7 days off MRI liver 07/15/2018- no evidence of metastatic disease.  Stable severe hepatic steatosis. Xeloda  continued 7 days on/7 days off MRI liver 03/16/2019-hepatic steatosis, no liver mass, focal area of intrahepatic biliary  dilatation in segments 2 and 3 of the left lobe-increased Xeloda continued 7 days on/7 days off MRI abdomen 08/19/2019-no findings to suggest liver metastases.  Bilateral lung nodules measuring up to 2.8 cm, progressive and more conspicuous than on previous exam CT chest 08/29/2019-multiple pulmonary metastases, new from 04/21/2016 Cycle 1 FOLFIRI/bevacizumab 09/09/2019 Cycle 2 FOLFIRI/bevacizumab 09/26/2019  Cycle 3 FOLFIRI/bevacizumab 10/10/2019 Cycle 4 FOLFIRI/bevacizumab 10/24/2019 Cycle 5 FOLFIRI/bevacizumab 11/07/2019 CT chest 11/14/2019-decreased size of lung nodules, no new lesions, hepatic steatosis Cycle 6 FOLFIRI/bevacizumab 11/21/2019 Cycle 7 FOLFIRI/bevacizumab 12/05/2019 Cycle 8 FOLFIRI/bevacizumab 12/19/2019 Cycle 9 FOLFIRI/bevacizumab 01/02/2020 Cycle 10 FOLFIRI/bevacizumab 01/16/2020 CT chest 01/29/2020-stable bilateral pulmonary metastases.  No new or progressive metastatic disease in the chest. Cycle 11 FOLFIRI/bevacizumab 01/30/2020 Cycle 12 FOLFIRI/bevacizumab 02/19/2020 Cycle 13 FOLFIRI/bevacizumab 03/12/2020 Cycle 14 FOLFIRI/bevacizumab 04/02/2020 Cycle 15 FOLFIRI/bevacizumab 04/23/2020 Cycle 16 FOLFIRI/bevacizumab 05/21/2020 CT chest 06/09/2020-mild progression pulmonary metastasis.  Some lesions have increased in size while others are similar. Cycle 1 irinotecan/Panitumumab 06/18/2020 Cycle 2 irinotecan/Panitumumab 07/02/2020 Cycle 3 irinotecan/panitumumab 07/16/2020 Cycle 4 irinotecan/Panitumumab 07/30/2020 Cycle 5 irinotecan/Panitumumab 08/13/2020, Emend added Cycle 6 irinotecan/Panitumumab 08/27/2020 CT chest 09/08/2020-decreased size of pulmonary nodules, no progressive disease Cycle 7 irinotecan/panitumumab 09/02/2020 Cycle 8 irinotecan/panitumumab 09/24/2020 Cycle 9 irinotecan/Panitumumab 10/08/2020 10/22/2020 treatment held due to left foot fracture, need for surgery Cycle 10 irinotecan/panitumumab 11/24/2020 Cycle 11 irinotecan/Panitumumab 12/08/2020 Cycle 12 irinotecan/panitumumab  12/22/2020 Cycle 13 irinotecan/panitumumab 01/05/2021 01/20/2021 CT chest-mixed response with minimal increase in size of some lesions and minimal decrease in the size of other lesions.  Overall number of lesions is unchanged. Cycle 14 irinotecan/Panitumumab 02/05/2021 Cycle 15 irinotecan/Panitumumab 02/23/2021 Cycle 16 irinotecan/panitumumab 03/16/2021 Cycle 17 irinotecan/Panitumumab 04/13/2021 05/03/2021-CT chest-enlargement of pulmonary metastases, no new lesions 06/21/2021-cycle 1 Lonsurf/bevacizumab 07/19/2021-cycle 2 Lonsurf/bevacizumab 08/16/2021-cycle 3 Lonsurf/bevacizumab 09/09/2021 CT chest-bilateral lung nodules and masses with mixed response, overall stable to very minimally increased 09/13/2021 cycle 4 Lonsurf/bevacizumab 10/12/2021 cycle 5 Lonsurf/bevacizumab 10/21/2021 chest CT-bilateral pulmonary metastases without significant change.  No new or progressive disease within the chest. 11/08/2021 cycle 6 Lonsurf/bevacizumab 12/06/2021 cycle 7 Lonsurf/bevacizumab 01/03/2022 cycle 8 Lonsurf/bevacizumab CTs 01/25/2022-index lung lesion stable to mildly increased in size.  Mild retroperitoneal adenopathy, mildly increased in size compared to 12/16/2020. 02/01/2022 cycle 9 Lonsurf/bevacizumab 02/28/2022 cycle 10 Lonsurf/bevacizumab 03/28/2022 cycle 11 Lonsurf/bevacizumab CTs 04/21/2022-no change in bilateral pulmonary metastases, 2 new hypoattenuating liver lesions, new enlarged portacaval node Cycle 1 fruquintinib 05/24/2022 Cycle 2 fruquintinib 06/21/2022 Cycle 3 fruquintinib 07/19/2022 CTs 08/12/2022-stable lung nodules, hilar and retroperitoneal nodes, slight increase in size of liver metastases, stable right adrenal nodule Cycle 4 fruquintinib 08/16/2022 Cycle 5 fruquintinib 09/13/2022 Cycle 6 fruquintinib 10/11/2022 CTs 10/22/2022-progressive mediastinal, pericardial, and periportal adenopathy, enlargement of liver lesions, stable pulmonary nodules   2.   Rectal bleeding and constipation secondary to #1   3.    History of peripheral vascular disease, status post left lower extremity vascular bypass surgery in April 2017   4.   History of nephrolithiasis   5.   History of Graves' disease treated with radioactive iodine   6.   Anxiety/depression   7.   Hypertension   8.   Hospitalization 01/19/2016 with wound dehiscence status post secondary suture closure of abdominal wall   9.   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 Hyperglycemia 06/20/2016-diagnosed with diabetes, maintained on insulin   11.  Positive COVID test 12/13/2018; positive COVID test 01/25/2021  12. Hospitalized with seizure activity/DKA. Now on Keppra, insulin adjusted. 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  Left foot fracture-surgical repair 11/11/2020 14.  Admission 10/21/2022 with an acute CVA 10/21/2022-MRI brain-acute infarct in the right precentral gyrus and premotor area, negative for brain metastases CT angiogram head/neck 10/21/2022-no large vessel occlusion or stenosis TNK 10/21/2022    Disposition: Mr. Lembke has metastatic colon cancer.  He was recently hospitalized with a CVA.  Restaging CTs while in the hospital confirmed evidence of progressive metastatic disease.  Fruquintinib was discontinued.  Mr. Swoboda continues apixaban anticoagulation.  We discussed salvage systemic treatment options.  He was last treated with oxaliplatin based chemotherapy in January 2018.  The metastatic colon cancer did not progress on oxaliplatin.  I recommend repeat treatment with FOLFOX.  We reviewed potential toxicities associated with the FOLFOX regimen including the chance of nausea, mucositis, diarrhea, hematologic toxicity, infection, bleeding, hyperpigmentation, and  neuropathy.  He understands the chance of developing progressive and long-lasting neuropathy with further oxaliplatin therapy.  We also discussed the potential for an allergic reaction with oxaliplatin.  He agrees to proceed.  Mr. Vandermeulen will return for FOLFOX on 06/04/2021.  He will return for an office visit and chemotherapy 05/31/2021.  A chemotherapy care plan was entered today.  Thornton Papas, MD  11/07/2022  1:06 PM

## 2022-11-08 ENCOUNTER — Other Ambulatory Visit: Payer: Self-pay

## 2022-11-08 ENCOUNTER — Ambulatory Visit: Payer: Medicaid Other | Admitting: Occupational Therapy

## 2022-11-08 NOTE — Progress Notes (Signed)
Pharmacist Chemotherapy Monitoring - Initial Assessment    Anticipated start date: 11/14/22   The following has been reviewed per standard work regarding the patient's treatment regimen: The patient's diagnosis, treatment plan and drug doses, and organ/hematologic function Lab orders and baseline tests specific to treatment regimen  The treatment plan start date, drug sequencing, and pre-medications Prior authorization status  Patient's documented medication list, including drug-drug interaction screen and prescriptions for anti-emetics and supportive care specific to the treatment regimen The drug concentrations, fluid compatibility, administration routes, and timing of the medications to be used The patient's access for treatment and lifetime cumulative dose history, if applicable  The patient's medication allergies and previous infusion related reactions, if applicable   Changes made to treatment plan:  N/A  Follow up needed:  Previously treated with oxal, premeds added and may need to incr fluid and length of infusion based on treatment and tolerability   Jeff [name], [facility], 11/08/2022  12:34 PM

## 2022-11-08 NOTE — Therapy (Deleted)
OUTPATIENT OCCUPATIONAL THERAPY NEURO TREATMENT  Patient Name: Jeff [name] MRN: [id number] DOB:[birth year], 57 y.o., male Today's Date: 11/02/2022  PCP: Simone Curia, MD REFERRING PROVIDER: Marvel Plan, MD  END OF SESSION:   Past Medical History:  Diagnosis Date   Allergic rhinitis    Anxiety    Arthritis    Chemotherapy-induced thrombocytopenia    Chronic nausea    mild intermittant and loose stools due to colon cancer   Chronic neck and back pain    Depression    ED (erectile dysfunction)    Generalized idiopathic epilepsy and epileptic syndromes, not intractable, without status epilepticus (HCC) 10/25/2019   (11-10-2020 per pt wife last seizure 11/ 2021)  neurologist-- dr Karel Jarvis---  seizure activity/ DKA ;  EEG w/ evidence epileto genicity arising right frontocentral region;  taking keppra   GERD (gastroesophageal reflux disease)    Hiatal hernia    History of 2019 novel coronavirus disease (COVID-19) 12/13/2018   per pt wife , pt had moderate symtptoms that resolved   History of Graves' disease    s/p RAI 02/ 2012;  previous seen by endocrinologist--- dr Everardo All   History of kidney stones    History of non-ST elevation myocardial infarction (NSTEMI)    Jan 2014--  no CAD;  per notes probable coronary vasospasm   History of panic attacks    History of thyroid storm    Nov 2011 w/ grave's ,  s/p RAI 02/ 2012   Hyperlipidemia    Hypertension    Hypothyroidism following radioiodine therapy    RAI in 02/  2012---  previously followed by dr Everardo All,  now followed by pcp   Lower urinary tract symptoms (LUTS)    Metastatic colon cancer to liver (HCC) 11/2015   oncologist-- [facility]/ and seen by dr a. Maisie Fus;  01-14-2016 s/p sigmoid colectomy, liver bx, node dissection, PAC;   chemo started 09/ 2017  ;  04/ 2021 metastatic pulmonary nodules;   active treatment every 2 wks   Nephrolithiasis    left side non-obstructive per imaging 04/ 2022   PAD (peripheral artery disease)  [facility]) cardiologist-  dr Kirke Corin   cardiologist ----  dr Judie Petit. Kirke Corin ;   left popliteal occlusion behind knee w/ collaterals;  s/p attempted angioplasty 01/ 2016;  09-11-2015 s/p pop-TPT w/ ipsNR GSV   PONV (postoperative nausea and vomiting)    Rash    due to chemo   Type 2 diabetes mellitus treated with insulin (HCC)    followed by pcp----   (11-10-2020  per pt wife blood sugar check's 2-4 times daily,  fasting sugar--- 130--168)   Unspecified fracture of left foot, initial encounter for closed fracture 10/17/2020   midfoot   Past Surgical History:  Procedure Laterality Date   ABDOMINAL AORTAGRAM N/A 02/19/2014   Procedure: ABDOMINAL Ronny Flurry;  Surgeon: Iran Ouch, MD;  Location: [facility] CATH LAB;  Service: Cardiovascular;  Laterality: N/A;   ANTERIOR CERVICAL DECOMP/DISCECTOMY FUSION  09/08/2010   C3 -4   CYSTOSCOPY W/ URETERAL STENT PLACEMENT Right 01/04/2015   Procedure: CYSTOSCOPY WITH RETROGRADE PYELOGRAM/URETERAL STENT PLACEMENT;  Surgeon: Marcine Matar, MD;  Location: [facility] OR;  Service: Urology;  Laterality: Right;   CYSTOSCOPY W/ URETERAL STENT REMOVAL Right 01/26/2015   Procedure: CYSTOSCOPY WITH STENT REMOVAL;  Surgeon: Marcine Matar, MD;  Location: [name];  Service: Urology;  Laterality: Right;   CYSTOSCOPY/URETEROSCOPY/HOLMIUM LASER/STENT PLACEMENT Right 01/26/2015   Procedure: CYSTOSCOPY/URETEROSCOPY RIGHT;  Surgeon: Marcine Matar, MD;  Location: [facility];  Service: Urology;  Laterality: Right;   FEMORAL-POPLITEAL BYPASS GRAFT Left 09/11/2015   Procedure: BYPASS GRAFT LEFT ABOVE KNEE TO BELOW KNEE POPLITEAL ARTERY WITH LEFT GREATER SAPHENOUS VEIN;  Surgeon: Nada Libman, MD;  Location: [facility] OR;  Service: Vascular;  Laterality: Left;   INCISIONAL HERNIA REPAIR N/A 01/19/2016   Procedure: HERNIA REPAIR INCISIONAL;  Surgeon: Romie Levee, MD;  Location: [facility] ORS;  Service: General;  Laterality: N/A;   LAMINECTOMY AND MICRODISCECTOMY  LUMBAR SPINE  03/26/2009   left L5 -- S1   LAPAROSCOPIC SIGMOID COLECTOMY N/A 01/14/2016   Procedure: LAPAROSCOPIC SIGMOID COLECTOMY;  Surgeon: Romie Levee, MD;  Location: [facility] ORS;  Service: General;  Laterality: N/A;   LEFT HEART CATHETERIZATION WITH CORONARY ANGIOGRAM N/A 06/12/2012   Procedure: LEFT HEART CATHETERIZATION WITH CORONARY ANGIOGRAM;  Surgeon: Wendall Stade, MD;  Location: [facility] CATH LAB;  Service: Cardiovascular;  Laterality: N/A;   No sig. CAD/  normal LVF, ef 55-65%   LIVER BIOPSY N/A 01/14/2016   Procedure: LIVER BIOPSY;  Surgeon: Romie Levee, MD;  Location: [facility];  Service: General;  Laterality: N/A;   LOWER EXTREMITY ANGIOGRAM Left 05/28/2014   Procedure: LOWER EXTREMITY ANGIOGRAM;  Surgeon: Iran Ouch, MD;  Location: [facility] CATH LAB;  Service: Cardiovascular;  Laterality: Left;  Failed PTA CTO   OPEN REDUCTION INTERNAL FIXATION (ORIF) FOOT LISFRANC FRACTURE Left 11/11/2020   Procedure: OPEN REDUCTION INTERNAL FIXATION (ORIF) FOOT LISFRANC FRACTURE;  Surgeon: Park Liter, DPM;  Location: [facility];  Service: Podiatry;  Laterality: Left;  General with Regional Block   POPLITEAL ARTERY ANGIOPLASTY Left 05/28/2014    dr Kirke Corin   Attempted and unsuccessful due to inability to cross the occlusionnotes    PORTACATH PLACEMENT Right 01/14/2016   Procedure: INSERTION PORT-A-CATH;  Surgeon: Romie Levee, MD;  Location: [facility];  Service: General;  Laterality: Right;   POSTERIOR CERVICAL FUSION/FORAMINOTOMY N/A 12/10/2012   Procedure: POSTERIOR CERVICAL FUSION/FORAMINOTOMY LEVEL 1;  Surgeon: Cristi Loron, MD;  Location: [facility] NEURO [facility];  Service: Neurosurgery;  Laterality: N/A;  Cervical three-four posterior cervical fusion with lateral mass screws   POSTERIOR LUMBAR FUSION  11-11-2009;   07-15-2013   L5 -- S1;   L4-- S1   SHOULDER ARTHROSCOPY Right 03/08/2004   debridement labral tear/  DCR/  acromioplasty   UMBILICAL HERNIA REPAIR  1980   VEIN HARVEST Left  09/11/2015   Procedure: LEFT GREATER SAPHENOUS VEIN HARVEST;  Surgeon: Nada Libman, MD;  Location: [facility] OR;  Service: Vascular;  Laterality: Left;   Patient Active Problem List   Diagnosis Date Noted   Acute ischemic right MCA stroke (HCC) 10/21/2022   Daytime sleepiness 08/03/2021   OSA (obstructive sleep apnea) 08/03/2021   Body mass index (BMI) 35.0-35.9, adult 01/04/2021   Adhesive capsulitis of left shoulder 06/09/2020   Body mass index (BMI) 25.0-25.9, adult 11/27/2019   DKA (diabetic ketoacidoses) 10/25/2019   Seizure (HCC) 10/25/2019   Goals of care, counseling/discussion 09/03/2019   Thoracic spine pain 03/19/2019   Diabetes (HCC) 08/10/2016   Genetic testing 04/20/2016   Port catheter in place 03/14/2016   SBO (small bowel obstruction) (HCC) 01/19/2016   Incisional hernia 01/19/2016   Metastatic colon cancer to liver (HCC) 01/14/2016   Cancer of sigmoid colon (HCC) 12/18/2015   Aftercare following surgery of the circulatory system 09/23/2015   Therapeutic opioid induced constipation 09/23/2015   Left leg claudication (HCC) 09/11/2015   Cervical post-laminectomy syndrome 08/31/2015   Cervical  radiculopathy 04/07/2015   Ureterolithiasis 01/04/2015   ED (erectile dysfunction) 12/09/2014   Spasm 07/03/2014   Hypothyroidism following radioiodine therapy 05/26/2014   PAD (peripheral artery disease) (HCC) 02/11/2014   Lumbar facet arthropathy 07/15/2013   Degeneration of lumbar intervertebral disc 06/20/2013   Backache 05/21/2013   Lumbosacral radiculitis 05/21/2013   Chest pain, mid sternal 06/12/2012   Acute non-ST segment elevation myocardial infarction [facility]) 06/11/2012   Hyperlipidemia 06/11/2012   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   Hypertension 03/16/2010    ONSET DATE: 10/21/22, Referral 10/24/2022  REFERRING DIAG: I63.9 (ICD-10-CM) - Acute ischemic stroke (HCC)  THERAPY DIAG:  No diagnosis found.  Rationale for Evaluation and Treatment:  Rehabilitation  SUBJECTIVE:   SUBJECTIVE STATEMENT: Patient reports that he still has some difficulty with L UE coordination and strength but is  Pt accompanied by: self and significant other Wife - Lavern  PERTINENT HISTORY:   Patient has history of hypertension, diabetes, recurrent seizure on Keppra, PAD status post bypass, Graves' disease status post radiation, colon cancer on chemotherapy with liver and lung metastasis, thrombocytopenia arrived to ED as a CODE STROKE on 10/21/22 due to left hemiparesis and left facial droop.   He was eating lunch when he felt a cramp in his head but not a headache and felt onset of his typical seizure. However, did not have a seizure.  On exam, patient was alert, oriented, mild drift seen in left arm, but did have decreased grip and decreased sensation in left hand, left facial droop present, slight dysarthria.  Taken emergently to CT, no evidence of bleed. Due to patient history of metastatic cancer, MRI with and without contrast was ordered to evaluate for possible met lesion before discussing TPA possibility. MRI reviewed with radiologist as images became available, showed possible ischemic stroke and no lesion.   Stroke:  right MCA cortical small infarcts, embolic pattern, etiology unclear, could be from cardioembolic source versus hypercoag state from advanced cancer   Stroke:  right MCA cortical small infarcts, embolic pattern, etiology unclear, could be from cardioembolic source versus hypercoag state from advanced cancer  Active Problems: Colon cancer with metastasis Heart palpitation and tachycardia Uncontrolled diabetes Hypertension Hyperlipidemia PAD Graves' disease status post radiation Recurrent seizure  PRECAUTIONS: Other: R chest - Port (chemo)  WEIGHT BEARING RESTRICTIONS: No  PAIN:  Are you having pain? Yes: NPRS scale: 8/10 Pain location: back, neck - chronic, hip pain new since   Pain description: hurts Aggravating factors:  everything Relieving factors: pain medication sometimes, previously got injections  FALLS: Has patient fallen in last 6 months? No  LIVING ENVIRONMENT: Lives with: lives with their spouse and grandson & 63 yo Lives in: apartment Stairs: Yes: External: 2 steps; none Has following equipment at home: None  PLOF: Independent  PATIENT GOALS: L hand coordination/strength  OBJECTIVE:   HAND DOMINANCE: Right  ADLs: Overall ADLs: Limited assistance Transfers/ambulation related to ADLs: Eating: Wife has been helping cut his food Grooming: Hasn't shaved since he's been home UB Dressing: I LB Dressing: needs help with tying shoelaces Toileting: I Bathing: Showers regularly Tub Shower transfers: Independent Equipment: none  IADLs: Shopping: Wife completes Light housekeeping: Wife completes Meal Prep: Grandson may order [employer] when he is at home while wife is working.  Previously would get fast food but did not cook on his own. Community mobility: No AE Medication management: Wife has always sorted his medication into pill box Financial management: Wife continues to pay bills etc Handwriting:  NT  MOBILITY STATUS:  Independent  POSTURE COMMENTS:  No Significant postural limitations Sitting balance:  No issues  ACTIVITY TOLERANCE: Activity tolerance: Good  FUNCTIONAL OUTCOME MEASURES: TBD  UPPER EXTREMITY ROM:    Active ROM Right eval Left eval  Shoulder flexion WNL WFL - minor end range limitations  Shoulder abduction    Shoulder adduction    Shoulder extension    Shoulder internal rotation    Shoulder external rotation    Elbow flexion    Elbow extension    Wrist flexion    Wrist extension    Wrist ulnar deviation    Wrist radial deviation    Wrist pronation    Wrist supination    (Blank rows = not tested)  UPPER EXTREMITY MMT:     MMT Right eval Left eval  Shoulder flexion 5/5 4/5  Shoulder abduction    Shoulder adduction    Shoulder extension     Shoulder internal rotation    Shoulder external rotation    Middle trapezius    Lower trapezius    Elbow flexion  4/5  Elbow extension    Wrist flexion    Wrist extension    Wrist ulnar deviation    Wrist radial deviation    Wrist pronation    Wrist supination    (Blank rows = not tested)  HAND FUNCTION: Grip strength: Right: 85.3  lbs; Left: 37.2  lbs  COORDINATION: 9 Hole Peg test: Right: 20.51 sec; Left: 3 pegs in 60 sec Box and Blocks:  Right 44 blocks, Left 28 blocks  SENSATION: Light touch: Impaired  Patient reports some minor changes in L long/ring finger tip.  EDEMA: NA  MUSCLE TONE: WFL  COGNITION: Overall cognitive status: Within functional limits for tasks assessed  VISION: Subjective report: Reports needing an eye Dr appt due to h/o diabetes but needs to set that up. Baseline vision: Bifocals and wears them every once in awhile but not all the time Visual history: NA but has DM and needs follow up  VISION ASSESSMENT: Tracking/Visual pursuits: Able to track stimulus in all quads with minimal difficulty  Patient reports no difficulty with activities due to visual impairments  PERCEPTION: Not tested  PRAXIS: Not tested  OBSERVATIONS: Patient is a slender AA male with no AE for mobility today who presented for OT and PT evaluations with his wife.  He is well groomed, although he reports he has not shaved since he got home from the hospital.   TODAY'S TREATMENT:                                                                                                                              DATE: 11/02/22   Therapeutic Exercises - introduced the following putty exercises to improve grip strength and fine motor skills with demonstration and guidance to work on increasing isolated finger movements to rotate putty ball/log in his hand and for pinch motion needed to manage pegs etc.   -  Putty Squeezes  - 2 x daily - 2 sets - 10 reps - 3-Point Pinch with Putty  - 2  x daily - 2 sets - 10 reps - Finger Pinch and Pull with Putty  - 2 x daily - 2 sets - 10 reps - Tip PUSH with Putty  - 2 x daily - 2 sets - 10 reps  PATIENT EDUCATION: Education details: OT POC, Putty Exercises Person educated: Patient and Spouse Education method: Explanation, Demonstration, Tactile cues, Verbal cues, and Handouts Education comprehension: verbalized understanding, returned demonstration, verbal cues required, tactile cues required, and needs further education  HOME EXERCISE PROGRAM: Date: 11/02/2022 Putty Exercises Access Code: [email or web address] URL: [email or web address] Prepared by: Amada Kingfisher   GOALS: Goals reviewed with patient? Yes  SHORT TERM GOALS: Target date: 11/25/22  Patient will demonstrate at least 45 lbs LUE grip strength as needed to open jars and other containers.  Baseline: 37.2 lbs Goal status: INITIAL  2.  Patient will fill nine-hole peg with use of LUE in 2 minutes seconds or less.  Baseline: 3 pegs in 1 minute Goal status: INITIAL  3.  Patient will demonstrate UE grip/coordination HEP with 25% verbal cues or less for proper execution.  Baseline: Initiated education at [employer] Goal status: INITIAL  4.  Pt will be able to place at least 33 blocks using left hand with completion of Box and Blocks test.  Baseline: 27 Blocks Goal status: INITIAL   LONG TERM GOALS: Target date: 12/16/22  Patient will demonstrate at least 50 lbs LUE grip strength as needed to open jars and other containers.  Baseline: 37.2 lbs Goal status: INITIAL  2.  Patient will place pegs in nine-hole pegboard with LUE 1 minute or less.  Baseline: 3 pegs in 1 minute Goal status: INITIAL  3.  Patient will improve motor coordination to tie his shoelaces. Baseline: Dependent on wife Goal status: INITIAL  4.  Patient will demonstrate UE HEP with visual handouts and no cues for proper execution.  Baseline: Initiated Putty exercises at evaluation Goal status:  INITIAL   ASSESSMENT:  CLINICAL IMPRESSION: Patient is a 57 y.o. AA male who was seen today for occupational therapy evaluation for LUE deficits s/p recent CVA.  He has had some good return of LUE ROM with more deficits in L hand versus shoulder/elbow.  He has some weakness still in LUE grip and significant difficulty with FM task of pegboard versus gross grasp of blocks. Patient currently presents below baseline level of function with ADLs r/t FMS and would benefit from skilled OT services in the outpatient setting to work on incoordination and grip strength to help pt return to PLOF as able.     PERFORMANCE DEFICITS: in functional skills including ADLs, IADLs, coordination, dexterity, sensation, ROM, strength, flexibility, Fine motor control, decreased knowledge of use of DME, and UE functional use,   IMPAIRMENTS: are limiting patient from ADLs, IADLs, and leisure.   CO-MORBIDITIES: has co-morbidities such as cancer  that affects occupational performance. Patient will benefit from skilled OT to address above impairments and improve overall function.  REHAB POTENTIAL: Excellent   PLAN:  OT FREQUENCY: 1x/week  OT DURATION: 6 weeks  PLANNED INTERVENTIONS: self care/ADL training, therapeutic exercise, therapeutic activity, neuromuscular re-education, patient/family education, coping strategies training, and DME and/or AE instructions  RECOMMENDED OTHER SERVICES: Patient is being evaluated for physical therapy today.  CONSULTED AND AGREED WITH PLAN OF CARE: Patient and family member/caregiver  PLAN FOR NEXT SESSION: Review and update Putty exercises  and introduce coordination activities.   Victorino Sparrow, OT 11/02/2022, 8:38 AM

## 2022-11-11 ENCOUNTER — Other Ambulatory Visit: Payer: Self-pay

## 2022-11-11 DIAGNOSIS — C187 Malignant neoplasm of sigmoid colon: Secondary | ICD-10-CM

## 2022-11-13 ENCOUNTER — Other Ambulatory Visit: Payer: Self-pay | Admitting: Oncology

## 2022-11-14 ENCOUNTER — Inpatient Hospital Stay: Payer: Medicaid Other

## 2022-11-14 ENCOUNTER — Inpatient Hospital Stay: Payer: Medicaid Other | Attending: Oncology

## 2022-11-14 ENCOUNTER — Ambulatory Visit: Payer: Medicaid Other | Admitting: Nurse Practitioner

## 2022-11-14 ENCOUNTER — Ambulatory Visit: Payer: Medicaid Other

## 2022-11-14 ENCOUNTER — Other Ambulatory Visit: Payer: Self-pay | Admitting: Oncology

## 2022-11-14 VITALS — BP 152/97 | HR 100 | Temp 100.0°F | Resp 20 | Ht 70.0 in | Wt 206.4 lb

## 2022-11-14 DIAGNOSIS — C787 Secondary malignant neoplasm of liver and intrahepatic bile duct: Secondary | ICD-10-CM | POA: Insufficient documentation

## 2022-11-14 DIAGNOSIS — C187 Malignant neoplasm of sigmoid colon: Secondary | ICD-10-CM | POA: Diagnosis present

## 2022-11-14 DIAGNOSIS — Z5111 Encounter for antineoplastic chemotherapy: Secondary | ICD-10-CM | POA: Diagnosis present

## 2022-11-14 DIAGNOSIS — C189 Malignant neoplasm of colon, unspecified: Secondary | ICD-10-CM

## 2022-11-14 LAB — CMP (CANCER CENTER ONLY)
ALT: 122 U/L — ABNORMAL HIGH (ref 0–44)
AST: 88 U/L — ABNORMAL HIGH (ref 15–41)
Albumin: 3.7 g/dL (ref 3.5–5.0)
Alkaline Phosphatase: 148 U/L — ABNORMAL HIGH (ref 38–126)
Anion gap: 10 (ref 5–15)
BUN: 13 mg/dL (ref 6–20)
CO2: 26 mmol/L (ref 22–32)
Calcium: 9 mg/dL (ref 8.9–10.3)
Chloride: 101 mmol/L (ref 98–111)
Creatinine: 1.36 mg/dL — ABNORMAL HIGH (ref 0.61–1.24)
GFR, Estimated: >60 mL/min (ref >60)
Glucose, Bld: 161 mg/dL — ABNORMAL HIGH (ref 70–99)
Potassium: 3.9 mmol/L (ref 3.5–5.1)
Sodium: 137 mmol/L (ref 135–145)
Total Bilirubin: 0.5 mg/dL (ref 0.3–1.2)
Total Protein: 8 g/dL (ref 6.5–8.1)

## 2022-11-14 LAB — CBC WITH DIFFERENTIAL (CANCER CENTER ONLY)
Abs Immature Granulocytes: 0.07 10*3/uL (ref 0.00–0.07)
Basophils Absolute: 0 10*3/uL (ref 0.0–0.1)
Basophils Relative: 0 %
Eosinophils Absolute: 0.1 10*3/uL (ref 0.0–0.5)
Eosinophils Relative: 1 %
HCT: 32.9 % — ABNORMAL LOW (ref 39.0–52.0)
Hemoglobin: 10.3 g/dL — ABNORMAL LOW (ref 13.0–17.0)
Immature Granulocytes: 1 %
Lymphocytes Relative: 29 %
Lymphs Abs: 2.5 10*3/uL (ref 0.7–4.0)
MCH: 28.1 pg (ref 26.0–34.0)
MCHC: 31.3 g/dL (ref 30.0–36.0)
MCV: 89.6 fL (ref 80.0–100.0)
Monocytes Absolute: 0.6 10*3/uL (ref 0.1–1.0)
Monocytes Relative: 7 %
Neutro Abs: 5.4 10*3/uL (ref 1.7–7.7)
Neutrophils Relative %: 62 %
Platelet Count: 173 10*3/uL (ref 150–400)
RBC: 3.67 MIL/uL — ABNORMAL LOW (ref 4.22–5.81)
RDW: 15.2 % (ref 11.5–15.5)
WBC Count: 8.7 10*3/uL (ref 4.0–10.5)
nRBC: 0 % (ref 0.0–0.2)

## 2022-11-14 LAB — TOTAL PROTEIN, URINE DIPSTICK

## 2022-11-14 MED ORDER — DEXTROSE 5 % IV SOLN: Freq: Once | INTRAVENOUS | Status: AC

## 2022-11-14 MED ORDER — OXALIPLATIN CHEMO INJECTION 100 MG/20ML
65.0000 mg/m2 | Freq: Once | INTRAVENOUS | Status: AC
  Administered 2022-11-14: 150 mg via INTRAVENOUS
  Filled 2022-11-14: qty 20

## 2022-11-14 MED ORDER — DIPHENHYDRAMINE HCL 50 MG/ML IJ SOLN
25.0000 mg | Freq: Once | INTRAMUSCULAR | Status: AC
  Administered 2022-11-14: 25 mg via INTRAVENOUS
  Filled 2022-11-14: qty 1

## 2022-11-14 MED ORDER — FAMOTIDINE IN NACL 20-0.9 MG/50ML-% IV SOLN
20.0000 mg | Freq: Once | INTRAVENOUS | Status: AC
  Administered 2022-11-14: 20 mg via INTRAVENOUS
  Filled 2022-11-14: qty 50

## 2022-11-14 MED ORDER — LEUCOVORIN CALCIUM INJECTION 350 MG
400.0000 mg/m2 | Freq: Once | INTRAVENOUS | Status: DC
  Filled 2022-11-14: qty 43.6

## 2022-11-14 MED ORDER — PALONOSETRON HCL INJECTION 0.25 MG/5ML
0.2500 mg | Freq: Once | INTRAVENOUS | Status: AC
  Administered 2022-11-14: 0.25 mg via INTRAVENOUS
  Filled 2022-11-14: qty 5

## 2022-11-14 MED ORDER — LEUCOVORIN CALCIUM INJECTION 350 MG
400.0000 mg/m2 | Freq: Once | INTRAVENOUS | Status: AC
  Administered 2022-11-14: 872 mg via INTRAVENOUS
  Filled 2022-11-14: qty 43.6

## 2022-11-14 MED ORDER — SODIUM CHLORIDE 0.9 % IV SOLN
10.0000 mg | Freq: Once | INTRAVENOUS | Status: AC
  Administered 2022-11-14: 10 mg via INTRAVENOUS
  Filled 2022-11-14: qty 10

## 2022-11-14 MED ORDER — SODIUM CHLORIDE 0.9 % IV SOLN
2000.0000 mg/m2 | INTRAVENOUS | Status: DC
  Administered 2022-11-14: 4350 mg via INTRAVENOUS
  Filled 2022-11-14: qty 87

## 2022-11-14 MED ORDER — FLUOROURACIL CHEMO INJECTION 2.5 GM/50ML
400.0000 mg/m2 | Freq: Once | INTRAVENOUS | Status: AC
  Administered 2022-11-14: 850 mg via INTRAVENOUS
  Filled 2022-11-14: qty 17

## 2022-11-14 NOTE — Patient Instructions (Signed)
[facility]   Discharge Instructions: Thank you for choosing [facility] to provide your oncology and hematology care.   If you have a lab appointment with the [facility], please go directly to the [facility] and check in at the registration area.   Wear comfortable clothing and clothing appropriate for easy access to any Portacath or PICC line.   We strive to give you quality time with your provider. You may need to reschedule your appointment if you arrive late (15 or more minutes).  Arriving late affects you and other patients whose appointments are after yours.  Also, if you miss three or more appointments without notifying the office, you may be dismissed from the clinic at the provider's discretion.      For prescription refill requests, have your pharmacy contact our office and allow 72 hours for refills to be completed.    Today you received the following chemotherapy and/or immunotherapy agents Oxaliplatin (ELOXATIN), Leucovorin & Flourouracil (ADRUCIL).      To help prevent nausea and vomiting after your treatment, we encourage you to take your nausea medication as directed.  BELOW ARE SYMPTOMS THAT SHOULD BE REPORTED IMMEDIATELY: *FEVER GREATER THAN 100.4 F (38 C) OR HIGHER *CHILLS OR SWEATING *NAUSEA AND VOMITING THAT IS NOT CONTROLLED WITH YOUR NAUSEA MEDICATION *UNUSUAL SHORTNESS OF BREATH *UNUSUAL BRUISING OR BLEEDING *URINARY PROBLEMS (pain or burning when urinating, or frequent urination) *BOWEL PROBLEMS (unusual diarrhea, constipation, pain near the anus) TENDERNESS IN MOUTH AND THROAT WITH OR WITHOUT PRESENCE OF ULCERS (sore throat, sores in mouth, or a toothache) UNUSUAL RASH, SWELLING OR PAIN  UNUSUAL VAGINAL DISCHARGE OR ITCHING   Items with * indicate a potential emergency and should be followed up as soon as possible or go to the Emergency Department if any problems should occur.  Please show the  CHEMOTHERAPY ALERT CARD or IMMUNOTHERAPY ALERT CARD at check-in to the Emergency Department and triage nurse.  Should you have questions after your visit or need to cancel or reschedule your appointment, please contact [facility]  Dept: [phone]  and follow the prompts.  Office hours are 8:00 a.m. to 4:30 p.m. Monday - Friday. Please note that voicemails left after 4:00 p.m. may not be returned until the following business day.  We are closed weekends and major holidays. You have access to a nurse at all times for urgent questions. Please call the main number to the clinic Dept: [phone] and follow the prompts.   For any non-urgent questions, you may also contact your provider using MyChart. We now offer e-Visits for anyone 42 and older to request care online for non-urgent symptoms. For details visit [email or web address].   Also download the MyChart app! Go to the app store, search "MyChart", open the app, select [facility], and log in with your MyChart username and password.  Oxaliplatin Injection What is this medication? OXALIPLATIN (ox AL i PLA tin) treats colorectal cancer. It works by slowing down the growth of cancer cells. This medicine may be used for other purposes; ask your health care provider or pharmacist if you have questions. COMMON BRAND NAME(S): Eloxatin What should I tell my care team before I take this medication? They need to know if you have any of these conditions: Heart disease History of irregular heartbeat or rhythm Liver disease Low blood cell levels (white cells, red cells, and platelets) Lung or breathing disease, such as asthma Take medications that treat  or prevent blood clots Tingling of the fingers, toes, or other nerve disorder An unusual or allergic reaction to oxaliplatin, other medications, foods, dyes, or preservatives If you or your partner are pregnant or trying to get pregnant Breast-feeding How should  I use this medication? This medication is injected into a vein. It is given by your care team in a hospital or clinic setting. Talk to your care team about the use of this medication in children. Special care may be needed. Overdosage: If you think you have taken too much of this medicine contact a poison control center or emergency room at once. NOTE: This medicine is only for you. Do not share this medicine with others. What if I miss a dose? Keep appointments for follow-up doses. It is important not to miss a dose. Call your care team if you are unable to keep an appointment. What may interact with this medication? Do not take this medication with any of the following: Cisapride Dronedarone Pimozide Thioridazine This medication may also interact with the following: Aspirin and aspirin-like medications Certain medications that treat or prevent blood clots, such as warfarin, apixaban, dabigatran, and rivaroxaban Cisplatin Cyclosporine Diuretics Medications for infection, such as acyclovir, adefovir, amphotericin B, bacitracin, cidofovir, foscarnet, ganciclovir, gentamicin, pentamidine, vancomycin NSAIDs, medications for pain and inflammation, such as ibuprofen or naproxen Other medications that cause heart rhythm changes Pamidronate Zoledronic acid This list may not describe all possible interactions. Give your health care provider a list of all the medicines, herbs, non-prescription drugs, or dietary supplements you use. Also tell them if you smoke, drink alcohol, or use illegal drugs. Some items may interact with your medicine. What should I watch for while using this medication? Your condition will be monitored carefully while you are receiving this medication. You may need blood work while taking this medication. This medication may make you feel generally unwell. This is not uncommon as chemotherapy can affect healthy cells as well as cancer cells. Report any side effects. Continue  your course of treatment even though you feel ill unless your care team tells you to stop. This medication may increase your risk of getting an infection. Call your care team for advice if you get a fever, chills, sore throat, or other symptoms of a cold or flu. Do not treat yourself. Try to avoid being around people who are sick. Avoid taking medications that contain aspirin, acetaminophen, ibuprofen, naproxen, or ketoprofen unless instructed by your care team. These medications may hide a fever. Be careful brushing or flossing your teeth or using a toothpick because you may get an infection or bleed more easily. If you have any dental work done, tell your dentist you are receiving this medication. This medication can make you more sensitive to cold. Do not drink cold drinks or use ice. Cover exposed skin before coming in contact with cold temperatures or cold objects. When out in cold weather wear warm clothing and cover your mouth and nose to warm the air that goes into your lungs. Tell your care team if you get sensitive to the cold. Talk to your care team if you or your partner are pregnant or think either of you might be pregnant. This medication can cause serious birth defects if taken during pregnancy and for 9 months after the last dose. A negative pregnancy test is required before starting this medication. A reliable form of contraception is recommended while taking this medication and for 9 months after the last dose. Talk to your care  team about effective forms of contraception. Do not father a child while taking this medication and for 6 months after the last dose. Use a condom while having sex during this time period. Do not breastfeed while taking this medication and for 3 months after the last dose. This medication may cause infertility. Talk to your care team if you are concerned about your fertility. What side effects may I notice from receiving this medication? Side effects that you  should report to your care team as soon as possible: Allergic reactions--skin rash, itching, hives, swelling of the face, lips, tongue, or throat Bleeding--bloody or black, tar-like stools, vomiting blood or brown material that looks like coffee grounds, red or dark brown urine, small red or purple spots on skin, unusual bruising or bleeding Dry cough, shortness of breath or trouble breathing Heart rhythm changes--fast or irregular heartbeat, dizziness, feeling faint or lightheaded, chest pain, trouble breathing Infection--fever, chills, cough, sore throat, wounds that don't heal, pain or trouble when passing urine, general feeling of discomfort or being unwell Liver injury--right upper belly pain, loss of appetite, nausea, light-colored stool, dark yellow or brown urine, yellowing skin or eyes, unusual weakness or fatigue Low red blood cell level--unusual weakness or fatigue, dizziness, headache, trouble breathing Muscle injury--unusual weakness or fatigue, muscle pain, dark yellow or brown urine, decrease in amount of urine Pain, tingling, or numbness in the hands or feet Sudden and severe headache, confusion, change in vision, seizures, which may be signs of posterior reversible encephalopathy syndrome (PRES) Unusual bruising or bleeding Side effects that usually do not require medical attention (report to your care team if they continue or are bothersome): Diarrhea Nausea Pain, redness, or swelling with sores inside the mouth or throat Unusual weakness or fatigue Vomiting This list may not describe all possible side effects. Call your doctor for medical advice about side effects. You may report side effects to FDA at 1-800-FDA-1088. Where should I keep my medication? This medication is given in a hospital or clinic. It will not be stored at home. NOTE: This sheet is a summary. It may not cover all possible information. If you have questions about this medicine, talk to your doctor,  pharmacist, or health care provider.  2024 Elsevier/Gold Standard (2022-07-10 00:00:00)  Leucovorin Injection What is this medication? LEUCOVORIN (loo koe VOR in) prevents side effects from certain medications, such as methotrexate. It works by increasing folate levels. This helps protect healthy cells in your body. It may also be used to treat anemia caused by low levels of folate. It can also be used with fluorouracil, a type of chemotherapy, to treat colorectal cancer. It works by increasing the effects of fluorouracil in the body. This medicine may be used for other purposes; ask your health care provider or pharmacist if you have questions. What should I tell my care team before I take this medication? They need to know if you have any of these conditions: Anemia from low levels of vitamin B12 in the blood An unusual or allergic reaction to leucovorin, folic acid, other medications, foods, dyes, or preservatives Pregnant or trying to get pregnant Breastfeeding How should I use this medication? This medication is injected into a vein or a muscle. It is given by your care team in a hospital or clinic setting. Talk to your care team about the use of this medication in children. Special care may be needed. Overdosage: If you think you have taken too much of this medicine contact a poison control center  or emergency room at once. NOTE: This medicine is only for you. Do not share this medicine with others. What if I miss a dose? Keep appointments for follow-up doses. It is important not to miss your dose. Call your care team if you are unable to keep an appointment. What may interact with this medication? Capecitabine Fluorouracil Phenobarbital Phenytoin Primidone Trimethoprim;sulfamethoxazole This list may not describe all possible interactions. Give your health care provider a list of all the medicines, herbs, non-prescription drugs, or dietary supplements you use. Also tell them if you  smoke, drink alcohol, or use illegal drugs. Some items may interact with your medicine. What should I watch for while using this medication? Your condition will be monitored carefully while you are receiving this medication. This medication may increase the side effects of 5-fluorouracil. Tell your care team if you have diarrhea or mouth sores that do not get better or that get worse. What side effects may I notice from receiving this medication? Side effects that you should report to your care team as soon as possible: Allergic reactions--skin rash, itching, hives, swelling of the face, lips, tongue, or throat This list may not describe all possible side effects. Call your doctor for medical advice about side effects. You may report side effects to FDA at 1-800-FDA-1088. Where should I keep my medication? This medication is given in a hospital or clinic. It will not be stored at home. NOTE: This sheet is a summary. It may not cover all possible information. If you have questions about this medicine, talk to your doctor, pharmacist, or health care provider.  2024 Elsevier/Gold Standard (2021-10-05 00:00:00)  Fluorouracil Injection What is this medication? FLUOROURACIL (flure oh YOOR a sil) treats some types of cancer. It works by slowing down the growth of cancer cells. This medicine may be used for other purposes; ask your health care provider or pharmacist if you have questions. COMMON BRAND NAME(S): Adrucil What should I tell my care team before I take this medication? They need to know if you have any of these conditions: Blood disorders Dihydropyrimidine dehydrogenase (DPD) deficiency Infection, such as chickenpox, cold sores, herpes Kidney disease Liver disease Poor nutrition Recent or ongoing radiation therapy An unusual or allergic reaction to fluorouracil, other medications, foods, dyes, or preservatives If you or your partner are pregnant or trying to get  pregnant Breast-feeding How should I use this medication? This medication is injected into a vein. It is administered by your care team in a hospital or clinic setting. Talk to your care team about the use of this medication in children. Special care may be needed. Overdosage: If you think you have taken too much of this medicine contact a poison control center or emergency room at once. NOTE: This medicine is only for you. Do not share this medicine with others. What if I miss a dose? Keep appointments for follow-up doses. It is important not to miss your dose. Call your care team if you are unable to keep an appointment. What may interact with this medication? Do not take this medication with any of the following: Live virus vaccines This medication may also interact with the following: Medications that treat or prevent blood clots, such as warfarin, enoxaparin, dalteparin This list may not describe all possible interactions. Give your health care provider a list of all the medicines, herbs, non-prescription drugs, or dietary supplements you use. Also tell them if you smoke, drink alcohol, or use illegal drugs. Some items may interact with your  medicine. What should I watch for while using this medication? Your condition will be monitored carefully while you are receiving this medication. This medication may make you feel generally unwell. This is not uncommon as chemotherapy can affect healthy cells as well as cancer cells. Report any side effects. Continue your course of treatment even though you feel ill unless your care team tells you to stop. In some cases, you may be given additional medications to help with side effects. Follow all directions for their use. This medication may increase your risk of getting an infection. Call your care team for advice if you get a fever, chills, sore throat, or other symptoms of a cold or flu. Do not treat yourself. Try to avoid being around people who are  sick. This medication may increase your risk to bruise or bleed. Call your care team if you notice any unusual bleeding. Be careful brushing or flossing your teeth or using a toothpick because you may get an infection or bleed more easily. If you have any dental work done, tell your dentist you are receiving this medication. Avoid taking medications that contain aspirin, acetaminophen, ibuprofen, naproxen, or ketoprofen unless instructed by your care team. These medications may hide a fever. Do not treat diarrhea with over the counter products. Contact your care team if you have diarrhea that lasts more than 2 days or if it is severe and watery. This medication can make you more sensitive to the sun. Keep out of the sun. If you cannot avoid being in the sun, wear protective clothing and sunscreen. Do not use sun lamps, tanning beds, or tanning booths. Talk to your care team if you or your partner wish to become pregnant or think you might be pregnant. This medication can cause serious birth defects if taken during pregnancy and for 3 months after the last dose. A reliable form of contraception is recommended while taking this medication and for 3 months after the last dose. Talk to your care team about effective forms of contraception. Do not father a child while taking this medication and for 3 months after the last dose. Use a condom while having sex during this time period. Do not breastfeed while taking this medication. This medication may cause infertility. Talk to your care team if you are concerned about your fertility. What side effects may I notice from receiving this medication? Side effects that you should report to your care team as soon as possible: Allergic reactions--skin rash, itching, hives, swelling of the face, lips, tongue, or throat Heart attack--pain or tightness in the chest, shoulders, arms, or jaw, nausea, shortness of breath, cold or clammy skin, feeling faint or  lightheaded Heart failure--shortness of breath, swelling of the ankles, feet, or hands, sudden weight gain, unusual weakness or fatigue Heart rhythm changes--fast or irregular heartbeat, dizziness, feeling faint or lightheaded, chest pain, trouble breathing High ammonia level--unusual weakness or fatigue, confusion, loss of appetite, nausea, vomiting, seizures Infection--fever, chills, cough, sore throat, wounds that don't heal, pain or trouble when passing urine, general feeling of discomfort or being unwell Low red blood cell level--unusual weakness or fatigue, dizziness, headache, trouble breathing Pain, tingling, or numbness in the hands or feet, muscle weakness, change in vision, confusion or trouble speaking, loss of balance or coordination, trouble walking, seizures Redness, swelling, and blistering of the skin over hands and feet Severe or prolonged diarrhea Unusual bruising or bleeding Side effects that usually do not require medical attention (report to your care team if   they continue or are bothersome): Dry skin Headache Increased tears Nausea Pain, redness, or swelling with sores inside the mouth or throat Sensitivity to light Vomiting This list may not describe all possible side effects. Call your doctor for medical advice about side effects. You may report side effects to FDA at 1-800-FDA-1088. Where should I keep my medication? This medication is given in a hospital or clinic. It will not be stored at home. NOTE: This sheet is a summary. It may not cover all possible information. If you have questions about this medicine, talk to your doctor, pharmacist, or health care provider.  2024 Elsevier/Gold Standard (2021-09-07 00:00:00)  The chemotherapy medication bag should finish at 46 hours, 96 hours, or 7 days. For example, if your pump is scheduled for 46 hours and it was put on at 4:00 p.m., it should finish at 2:00 p.m. the day it is scheduled to come off regardless of your  appointment time.     Estimated time to finish at 12:30 p.m. on Wednesday 11/16/2022.   If the display on your pump reads "Low Volume" and it is beeping, take the batteries out of the pump and come to the cancer center for it to be taken off.   If the pump alarms go off prior to the pump reading "Low Volume" then call [phone] and someone can assist you.  If the plunger comes out and the chemotherapy medication is leaking out, please use your home chemo spill kit to clean up the spill. Do NOT use paper towels or other household products.  If you have problems or questions regarding your pump, please call either [phone] (24 hours a day) or the cancer center Monday-Friday 8:00 a.m.- 4:30 p.m. at the clinic number and we will assist you. If you are unable to get assistance, then go to the nearest Emergency Department and ask the staff to contact the IV team for assistance.

## 2022-11-14 NOTE — Progress Notes (Signed)
Per Dr. Truett Perna: okay to treat today with AST 88 & ALT 122

## 2022-11-15 ENCOUNTER — Ambulatory Visit: Payer: Medicaid Other | Attending: Neurology | Admitting: Occupational Therapy

## 2022-11-15 ENCOUNTER — Encounter: Payer: Self-pay | Admitting: Neurology

## 2022-11-15 ENCOUNTER — Ambulatory Visit: Payer: Medicaid Other | Attending: Cardiovascular Disease | Admitting: Cardiovascular Disease

## 2022-11-15 ENCOUNTER — Ambulatory Visit: Payer: Medicaid Other | Admitting: Neurology

## 2022-11-15 ENCOUNTER — Telehealth: Payer: Self-pay

## 2022-11-15 ENCOUNTER — Encounter: Payer: Self-pay | Admitting: Cardiovascular Disease

## 2022-11-15 VITALS — BP 149/85 | HR 102 | Ht 70.0 in | Wt 209.8 lb

## 2022-11-15 VITALS — BP 150/100 | HR 91 | Ht 70.0 in | Wt 209.2 lb

## 2022-11-15 DIAGNOSIS — G40309 Generalized idiopathic epilepsy and epileptic syndromes, not intractable, without status epilepticus: Secondary | ICD-10-CM | POA: Diagnosis not present

## 2022-11-15 DIAGNOSIS — I639 Cerebral infarction, unspecified: Secondary | ICD-10-CM

## 2022-11-15 DIAGNOSIS — R278 Other lack of coordination: Secondary | ICD-10-CM | POA: Diagnosis present

## 2022-11-15 DIAGNOSIS — I10 Essential (primary) hypertension: Secondary | ICD-10-CM | POA: Diagnosis not present

## 2022-11-15 DIAGNOSIS — I739 Peripheral vascular disease, unspecified: Secondary | ICD-10-CM

## 2022-11-15 DIAGNOSIS — E785 Hyperlipidemia, unspecified: Secondary | ICD-10-CM

## 2022-11-15 DIAGNOSIS — M6281 Muscle weakness (generalized): Secondary | ICD-10-CM | POA: Diagnosis present

## 2022-11-15 MED ORDER — AMLODIPINE BESYLATE 5 MG PO TABS: 5.0000 mg | ORAL_TABLET | Freq: Every day | ORAL | 3 refills | Status: DC

## 2022-11-15 MED ORDER — LEVETIRACETAM 750 MG PO TABS: ORAL_TABLET | ORAL | 3 refills | Status: DC

## 2022-11-15 NOTE — Telephone Encounter (Signed)
24 HOUR CALL BACK   Telephone call to patient post Folfox infusion (last received in 2018), unable to reach patient. Left a voice message.

## 2022-11-15 NOTE — Patient Instructions (Signed)
Good to see you looking well. Continue Keppra 750mg  twice a day. Pls confirm that Dr Myrle Sheng will be prescribing your Eliquis. Continue control of blood pressure, cholesterol, sugar levels. Continue physical and occupational therapy. For any sudden changes in symptoms, go to ER. Follow-up as scheduled in April 2025, call for any changes   Seizure Precautions: 1. If medication has been prescribed for you to prevent seizures, take it exactly as directed.  Do not stop taking the medicine without talking to your doctor first, even if you have not had a seizure in a long time.   2. Avoid activities in which a seizure would cause danger to yourself or to others.  Don't operate dangerous machinery, swim alone, or climb in high or dangerous places, such as on ladders, roofs, or girders.  Do not drive unless your doctor says you may.  3. If you have any warning that you may have a seizure, lay down in a safe place where you can't hurt yourself.    4.  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   Please refer to the following link on the [employer]'s website for more information: http://www.epilepsyfoundation.org/answerplace/Social/driving/drivingu.cfm   5.  Maintain good sleep hygiene. Avoid alcohol  6.  Contact your doctor if you have any problems that may be related to the medicine you are taking.  7.  Call 911 and bring the patient back to the ED if:        A.  The seizure lasts longer than 5 minutes.       B.  The patient doesn't awaken shortly after the seizure  C.  The patient has new problems such as difficulty seeing, speaking or moving  D.  The patient was injured during the seizure  E.  The patient has a temperature over 102 F (39C)  F.  The patient vomited and now is having trouble breathing
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
## 2022-11-15 NOTE — Therapy (Signed)
OUTPATIENT OCCUPATIONAL THERAPY NEURO TREATMENT  Patient Name: Jeff [name] MRN: [id number] DOB:1966/01/03, 56 y.o., male Today's Date: 11/15/2022  PCP: Simone Curia, MD REFERRING PROVIDER: Marvel Plan, MD  END OF SESSION:  OT End of Session - 11/15/22 4098     Visit Number 2    Number of Visits 6   + eval   Date for OT Re-Evaluation 12/16/22    Authorization Type [employer] Medicaid    OT Start Time 0804    OT Stop Time 0845    OT Time Calculation (min) 41 min    Equipment Utilized During Treatment FM materials    Activity Tolerance Patient tolerated treatment well    Behavior During Therapy WFL for tasks assessed/performed             Past Medical History:  Diagnosis Date   Allergic rhinitis    Anxiety    Arthritis    Chemotherapy-induced thrombocytopenia    Chronic nausea    mild intermittant and loose stools due to colon cancer   Chronic neck and back pain    Depression    ED (erectile dysfunction)    Generalized idiopathic epilepsy and epileptic syndromes, not intractable, without status epilepticus (HCC) 10/25/2019   (11-10-2020 per pt wife last seizure 11/ 2021)  neurologist-- dr Karel Jarvis---  seizure activity/ DKA ;  EEG w/ evidence epileto genicity arising right frontocentral region;  taking keppra   GERD (gastroesophageal reflux disease)    Hiatal hernia    History of 2019 novel coronavirus disease (COVID-19) 12/13/2018   per pt wife , pt had moderate symtptoms that resolved   History of Graves' disease    s/p RAI 02/ 2012;  previous seen by endocrinologist--- dr Everardo All   History of kidney stones    History of non-ST elevation myocardial infarction (NSTEMI)    Jan 2014--  no CAD;  per notes probable coronary vasospasm   History of panic attacks    History of thyroid storm    Nov 2011 w/ grave's ,  s/p RAI 02/ 2012   Hyperlipidemia    Hypertension    Hypothyroidism following radioiodine therapy    RAI in 02/  2012---  previously followed by dr Everardo All,  now  followed by pcp   Lower urinary tract symptoms (LUTS)    Metastatic colon cancer to liver (HCC) 11/2015   oncologist-- [facility]/ and seen by dr a. Maisie Fus;  01-14-2016 s/p sigmoid colectomy, liver bx, node dissection, PAC;   chemo started 09/ 2017  ;  04/ 2021 metastatic pulmonary nodules;   active treatment every 2 wks   Nephrolithiasis    left side non-obstructive per imaging 04/ 2022   PAD (peripheral artery disease) [facility]) cardiologist-  dr Kirke Corin   cardiologist ----  dr Judie Petit. Kirke Corin ;   left popliteal occlusion behind knee w/ collaterals;  s/p attempted angioplasty 01/ 2016;  09-11-2015 s/p pop-TPT w/ ipsNR GSV   PONV (postoperative nausea and vomiting)    Rash    due to chemo   Type 2 diabetes mellitus treated with insulin (HCC)    followed by pcp----   (11-10-2020  per pt wife blood sugar check's 2-4 times daily,  fasting sugar--- 130--168)   Unspecified fracture of left foot, initial encounter for closed fracture 10/17/2020   midfoot   Past Surgical History:  Procedure Laterality Date   ABDOMINAL AORTAGRAM N/A 02/19/2014   Procedure: ABDOMINAL Ronny Flurry;  Surgeon: Iran Ouch, MD;  Location: [facility] CATH LAB;  Service: Cardiovascular;  Laterality: N/A;   ANTERIOR CERVICAL DECOMP/DISCECTOMY FUSION  09/08/2010   C3 -4   CYSTOSCOPY W/ URETERAL STENT PLACEMENT Right 01/04/2015   Procedure: CYSTOSCOPY WITH RETROGRADE PYELOGRAM/URETERAL STENT PLACEMENT;  Surgeon: Marcine Matar, MD;  Location: [facility] OR;  Service: Urology;  Laterality: Right;   CYSTOSCOPY W/ URETERAL STENT REMOVAL Right 01/26/2015   Procedure: CYSTOSCOPY WITH STENT REMOVAL;  Surgeon: Marcine Matar, MD;  Location: [facility];  Service: Urology;  Laterality: Right;   CYSTOSCOPY/URETEROSCOPY/HOLMIUM LASER/STENT PLACEMENT Right 01/26/2015   Procedure: CYSTOSCOPY/URETEROSCOPY RIGHT;  Surgeon: Marcine Matar, MD;  Location: [facility];  Service: Urology;  Laterality: Right;   FEMORAL-POPLITEAL  BYPASS GRAFT Left 09/11/2015   Procedure: BYPASS GRAFT LEFT ABOVE KNEE TO BELOW KNEE POPLITEAL ARTERY WITH LEFT GREATER SAPHENOUS VEIN;  Surgeon: Nada Libman, MD;  Location: [facility] OR;  Service: Vascular;  Laterality: Left;   INCISIONAL HERNIA REPAIR N/A 01/19/2016   Procedure: HERNIA REPAIR INCISIONAL;  Surgeon: Romie Levee, MD;  Location: [facility] ORS;  Service: General;  Laterality: N/A;   LAMINECTOMY AND MICRODISCECTOMY LUMBAR SPINE  03/26/2009   left L5 -- S1   LAPAROSCOPIC SIGMOID COLECTOMY N/A 01/14/2016   Procedure: LAPAROSCOPIC SIGMOID COLECTOMY;  Surgeon: Romie Levee, MD;  Location: [facility] ORS;  Service: General;  Laterality: N/A;   LEFT HEART CATHETERIZATION WITH CORONARY ANGIOGRAM N/A 06/12/2012   Procedure: LEFT HEART CATHETERIZATION WITH CORONARY ANGIOGRAM;  Surgeon: Wendall Stade, MD;  Location: [name] CATH LAB;  Service: Cardiovascular;  Laterality: N/A;   No sig. CAD/  normal LVF, ef 55-65%   LIVER BIOPSY N/A 01/14/2016   Procedure: LIVER BIOPSY;  Surgeon: Romie Levee, MD;  Location: [facility] ORS;  Service: General;  Laterality: N/A;   LOWER EXTREMITY ANGIOGRAM Left 05/28/2014   Procedure: LOWER EXTREMITY ANGIOGRAM;  Surgeon: Iran Ouch, MD;  Location: [facility] CATH LAB;  Service: Cardiovascular;  Laterality: Left;  Failed PTA CTO   OPEN REDUCTION INTERNAL FIXATION (ORIF) FOOT LISFRANC FRACTURE Left 11/11/2020   Procedure: OPEN REDUCTION INTERNAL FIXATION (ORIF) FOOT LISFRANC FRACTURE;  Surgeon: Park Liter, DPM;  Location: [facility];  Service: Podiatry;  Laterality: Left;  General with Regional Block   POPLITEAL ARTERY ANGIOPLASTY Left 05/28/2014    dr Kirke Corin   Attempted and unsuccessful due to inability to cross the occlusionnotes    PORTACATH PLACEMENT Right 01/14/2016   Procedure: INSERTION PORT-A-CATH;  Surgeon: Romie Levee, MD;  Location: [facility] ORS;  Service: General;  Laterality: Right;   POSTERIOR CERVICAL FUSION/FORAMINOTOMY N/A 12/10/2012   Procedure: POSTERIOR  CERVICAL FUSION/FORAMINOTOMY LEVEL 1;  Surgeon: Cristi Loron, MD;  Location: [facility] NEURO ORS;  Service: Neurosurgery;  Laterality: N/A;  Cervical three-four posterior cervical fusion with lateral mass screws   POSTERIOR LUMBAR FUSION  11-11-2009;   07-15-2013   L5 -- S1;   L4-- S1   SHOULDER ARTHROSCOPY Right 03/08/2004   debridement labral tear/  DCR/  acromioplasty   UMBILICAL HERNIA REPAIR  1980   VEIN HARVEST Left 09/11/2015   Procedure: LEFT GREATER SAPHENOUS VEIN HARVEST;  Surgeon: Nada Libman, MD;  Location: [facility] OR;  Service: Vascular;  Laterality: Left;   Patient Active Problem List   Diagnosis Date Noted   Acute ischemic right MCA stroke (HCC) 10/21/2022   Daytime sleepiness 08/03/2021   OSA (obstructive sleep apnea) 08/03/2021   Body mass index (BMI) 35.0-35.9, adult 01/04/2021   Adhesive capsulitis of left shoulder 06/09/2020   Body mass index (BMI) 25.0-25.9, adult 11/27/2019   DKA (  diabetic ketoacidoses) 10/25/2019   Seizure (HCC) 10/25/2019   Goals of care, counseling/discussion 09/03/2019   Thoracic spine pain 03/19/2019   Diabetes (HCC) 08/10/2016   Genetic testing 04/20/2016   Port catheter in place 03/14/2016   SBO (small bowel obstruction) (HCC) 01/19/2016   Incisional hernia 01/19/2016   Metastatic colon cancer to liver (HCC) 01/14/2016   Cancer of sigmoid colon (HCC) 12/18/2015   Aftercare following surgery of the circulatory system 09/23/2015   Therapeutic opioid induced constipation 09/23/2015   Left leg claudication (HCC) 09/11/2015   Cervical post-laminectomy syndrome 08/31/2015   Cervical radiculopathy 04/07/2015   Ureterolithiasis 01/04/2015   ED (erectile dysfunction) 12/09/2014   Spasm 07/03/2014   Hypothyroidism following radioiodine therapy 05/26/2014   PAD (peripheral artery disease) (HCC) 02/11/2014   Lumbar facet arthropathy 07/15/2013   Degeneration of lumbar intervertebral disc 06/20/2013   Backache 05/21/2013   Lumbosacral radiculitis  05/21/2013   Chest pain, mid sternal 06/12/2012   Acute non-ST segment elevation myocardial infarction (HCC) 06/11/2012   Hyperlipidemia 06/11/2012   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   Hypertension 03/16/2010    ONSET DATE: 10/21/22, Referral 10/24/2022  REFERRING DIAG: I63.9 (ICD-10-CM) - Acute ischemic stroke (HCC)  THERAPY DIAG:  Other lack of coordination  Muscle weakness (generalized)  Rationale for Evaluation and Treatment: Rehabilitation  SUBJECTIVE:   SUBJECTIVE STATEMENT:   Patient reports that he still has some difficulty with L UE coordination and strength but it is getting better.   Pt accompanied by: self and significant other Wife - Lavern  PERTINENT HISTORY:   Patient has history of hypertension, diabetes, recurrent seizure on Keppra, PAD status post bypass, Graves' disease status post radiation, colon cancer on chemotherapy with liver and lung metastasis, thrombocytopenia arrived to ED as a CODE STROKE on 10/21/22 due to left hemiparesis and left facial droop.   He was eating lunch when he felt a cramp in his head but not a headache and felt onset of his typical seizure. However, did not have a seizure.  On exam, patient was alert, oriented, mild drift seen in left arm, but did have decreased grip and decreased sensation in left hand, left facial droop present, slight dysarthria.  Taken emergently to CT, no evidence of bleed. Due to patient history of metastatic cancer, MRI with and without contrast was ordered to evaluate for possible met lesion before discussing TPA possibility. MRI reviewed with radiologist as images became available, showed possible ischemic stroke and no lesion.   Stroke:  right MCA cortical small infarcts, embolic pattern, etiology unclear, could be from cardioembolic source versus hypercoag state from advanced cancer   Stroke:  right MCA cortical small infarcts, embolic pattern, etiology unclear, could be from cardioembolic source versus  hypercoag state from advanced cancer  Active Problems: Colon cancer with metastasis Heart palpitation and tachycardia Uncontrolled diabetes Hypertension Hyperlipidemia PAD Graves' disease status post radiation Recurrent seizure  PRECAUTIONS: Other: R chest - Port (chemo)  WEIGHT BEARING RESTRICTIONS: No  PAIN:  Are you having pain? Yes: NPRS scale: 8/10 Pain location: back, neck - chronic Pain description: hurts Aggravating factors: everything Relieving factors: pain medication sometimes, previously got injections  FALLS: Has patient fallen in last 6 months? No  LIVING ENVIRONMENT: Lives with: lives with their spouse and grandson & 34 yo Lives in: apartment Stairs: Yes: External: 2 steps; none Has following equipment at home: None  PLOF: Independent  PATIENT GOALS: L hand coordination/strength  OBJECTIVE:   HAND DOMINANCE: Right  ADLs: Overall ADLs: Limited assistance  Transfers/ambulation related to ADLs: Eating: Wife has been helping cut his food Grooming: Hasn't shaved since he's been home UB Dressing: I LB Dressing: needs help with tying shoelaces Toileting: I Bathing: Showers regularly Tub Shower transfers: Independent Equipment: none  IADLs: Shopping: Wife completes Light housekeeping: Wife completes Meal Prep: Grandson may order [employer] when he is at home while wife is working.  Previously would get fast food but did not cook on his own. Community mobility: No AE Medication management: Wife has always sorted his medication into pill box Financial management: Wife continues to pay bills etc Handwriting:  NT  MOBILITY STATUS: Independent  POSTURE COMMENTS:  No Significant postural limitations Sitting balance:  No issues  ACTIVITY TOLERANCE: Activity tolerance: Good  FUNCTIONAL OUTCOME MEASURES: TBD  UPPER EXTREMITY ROM:    Active ROM Right eval Left eval  Shoulder flexion WNL WFL - minor end range limitations  Shoulder abduction     Shoulder adduction    Shoulder extension    Shoulder internal rotation    Shoulder external rotation    Elbow flexion    Elbow extension    Wrist flexion    Wrist extension    Wrist ulnar deviation    Wrist radial deviation    Wrist pronation    Wrist supination    (Blank rows = not tested)  UPPER EXTREMITY MMT:     MMT Right eval Left eval  Shoulder flexion 5/5 4/5  Shoulder abduction    Shoulder adduction    Shoulder extension    Shoulder internal rotation    Shoulder external rotation    Middle trapezius    Lower trapezius    Elbow flexion  4/5  Elbow extension    Wrist flexion    Wrist extension    Wrist ulnar deviation    Wrist radial deviation    Wrist pronation    Wrist supination    (Blank rows = not tested)  HAND FUNCTION: Grip strength: Right: 85.3  lbs; Left: 37.2  lbs  COORDINATION: 9 Hole Peg test: Right: 20.51 sec; Left: 3 pegs in 60 sec Box and Blocks:  Right 44 blocks, Left 28 blocks  11/15/22: 9 Hole Peg Test 51.48 sec  SENSATION: Light touch: Impaired  Patient reports some minor changes in L long/ring finger tip.  EDEMA: NA  MUSCLE TONE: WFL  COGNITION: Overall cognitive status: Within functional limits for tasks assessed  VISION: Subjective report: Reports needing an eye Dr appt due to h/o diabetes but needs to set that up. Baseline vision: Bifocals and wears them every once in awhile but not all the time Visual history: NA but has DM and needs follow up  VISION ASSESSMENT: Tracking/Visual pursuits: Able to track stimulus in all quads with minimal difficulty  Patient reports no difficulty with activities due to visual impairments  PERCEPTION: Not tested  PRAXIS: Not tested  OBSERVATIONS at EVAL: Patient is a slender AA male with no AE for mobility today who presented for OT and PT evaluations with his wife.  He is well groomed, although he reports he has not shaved since he got home from the hospital.   TODAY'S TREATMENT:  DATE: 11/15/22    Therapeutic Activities:   Patient engaged in retesting of 9 hole peg test s/p observation of engagement with Coordination activities and improved from placing 3 pegs in 1 minute at evaluation to completing the 9 peg placement and removal in 51.48 seconds.    Coordination Exercise handout with images provided for various activities to work on L UE finger ROM, dexterity and isolated movements with demonstration and practice, as well as modification, hand over hand guidance and cues throughout to progress tasks ie) from atop a washcloth to simply atop table top.    Rotate ball in fingertips (clockwise and counter-clockwise). -- Turning ball with isolated fingers and thumb motions.   Shuffling cards and dealing/fliping cards 1 at a time. -- Setup patient to work on sorting cards for sorting focusing on using L index finger with thumb to flip cards.   Pick up coins, dominoes, buttons, marbles, dried beans/pasta of different sizes ... To place in containers To stack -- He has difficulty surrounding coins/pennies to stack them and is encouraged to try checkers (or Connect 4 pieces which connect together better). To pick up items one at a time until he gets 5+ in his hand and then move item from palm to fingertips to release.  -- He needed extra time, and cues for improving in hand manipulation of various objects   Twirl pen between fingers. -- He is encouraged to work on isolating fingers individually and practice with R side if he needs feedback to L hand.   Tear a piece of paper towel and roll it into small balls. -- Again, with cues and guidance to isolate index and thumb motions as he tends have difficulty moving fingers small enough to make small 'spitballs'.   He is encourage to take breaks, relax shoulder, minimize compensatory motions and a try different  activities throughout the week.  PATIENT EDUCATION: Education details: Psychiatrist  Person educated: Patient and Spouse Education method: Explanation, Demonstration, Actor cues, Verbal cues, and Handouts Education comprehension: verbalized understanding, returned demonstration, verbal cues required, tactile cues required, and needs further education  HOME EXERCISE PROGRAM: 11/02/2022 Putty Exercises Access Code: [email or web address] 11/15/22 Coordination Activities with Images    GOALS: Goals reviewed with patient? Yes  SHORT TERM GOALS: Target date: 11/25/22  Patient will demonstrate at least 45 lbs LUE grip strength as needed to open jars and other containers.  Baseline: 37.2 lbs Goal status: IN PROGRESS  2.  Patient will fill nine-hole peg with use of LUE in 2 minutes seconds or less.  Baseline: 3 pegs in 1 minute Goal status: MET 11/15/22 - 51.48 seconds  3.  Patient will demonstrate UE grip/coordination HEP with 25% verbal cues or less for proper execution.  Baseline: Initiated education at [employer] Goal status: IN PROGRESS 11/14/22 - Coordination Exercises with Images provided  4.  Pt will be able to place at least 33 blocks using left hand with completion of Box and Blocks test.  Baseline: 27 Blocks Goal status: IN PROGRESS   LONG TERM GOALS: Target date: 12/16/22  Patient will demonstrate at least 50 lbs LUE grip strength as needed to open jars and other containers.  Baseline: 37.2 lbs Goal status: IN PROGRESS  2.  Patient will place pegs in nine-hole pegboard with LUE in less than 45 seconds.  Baseline: 3 pegs in 1 minute Goal status: REVISED 11/15/22 - 51.48 seconds  3.  Patient will improve motor coordination to tie his shoelaces. Baseline: Dependent on wife Goal  status: IN PROGRESS  4.  Patient will demonstrate UE HEP with visual handouts and no cues for proper execution.  Baseline: Initiated Putty exercises at evaluation Goal status: IN  PROGRESS   ASSESSMENT:  CLINICAL IMPRESSION:  Patient is a 57 y.o. AA male who was seen today for occupational therapy treatment for LUE deficits s/p recent CVA.  He has had some good return of LUE ROM with significant improvement in FM task of pegboard in less than 1 minute for 9 pegs compared to only 3 pegs inserted in 1 minutes at evaluation.  Patient continues to present below baseline level of function with FMS and benefits from skilled OT services in the outpatient setting to work on improving coordination and grip strength to help pt return to PLOF as able.     PERFORMANCE DEFICITS: in functional skills including ADLs, IADLs, coordination, dexterity, sensation, ROM, strength, flexibility, Fine motor control, decreased knowledge of use of DME, and UE functional use,   IMPAIRMENTS: are limiting patient from ADLs, IADLs, and leisure.   CO-MORBIDITIES: has co-morbidities such as cancer  that affects occupational performance. Patient will benefit from skilled OT to address above impairments and improve overall function.  REHAB POTENTIAL: Excellent   PLAN:  OT FREQUENCY: 1x/week  OT DURATION: 6 weeks  PLANNED INTERVENTIONS: self care/ADL training, therapeutic exercise, therapeutic activity, neuromuscular re-education, patient/family education, coping strategies training, and DME and/or AE instructions  RECOMMENDED OTHER SERVICES: Patient is being evaluated for physical therapy today.  CONSULTED AND AGREED WITH PLAN OF CARE: Patient and family member/caregiver  PLAN FOR NEXT SESSION:  Redo Blocks and Box test. Progress/update Putty exercises and coordination activities. Sensory changes/compensatory strategies.    Victorino Sparrow, OT 11/15/2022, 12:31 PM

## 2022-11-15 NOTE — Progress Notes (Signed)
NEUROLOGY FOLLOW UP OFFICE NOTE  Jeff [name] [id number] 29-Aug-1965  HISTORY OF PRESENT ILLNESS: I had the pleasure of seeing Jeff [name] in follow-up in the neurology clinic on 11/15/2022.  The patient was last seen 3 months ago and presents for an earlier visit after a stroke last 10/21/2022. He is again accompanied by his wife who helps supplement the history today.  Records and images were personally reviewed where available.  He was eating lunch when he felt a cramp in his head but not a headache, he felt he would have a seizure but it did not progress. He was noted to have left-sided weakness and slight dysarthria. I personally reviewed brain MRI with and without contrast which showed acute infarcts in the right precentral gyrus and premotor area, no metastatic disease. CTA head and neck no significant stenosis.He was given TNKase. Stroke had embolic pattern, possibly cardioembolic versus hypercoagulable state due to cancer. Pan-CT showed progressive disease with progressive mediastinal, pericardial, and periportal adenopathy and hepatic metastatic disease. He was started on Eliquis given cancer progression and intermittent heart palpitations. HbA1c was 7.5, LDL 68. He continues on Levetiracetam 750mg  BID for seizure prophylaxis.  He is in good spirits and reports doing well. He feels his left side is better, he mostly notices some numbness on the 56 4th digits, and some difficulty with fine movements of the left hand/picking things up. Speech is better, leg is normal. He denies any seizures since 12/18/2021. He denies any headaches, dizziness, vision changes, no falls. No difficulty swallowing. He is driving.    History on Initial Assessment 11/14/2019: This is a 57 year old right-handed man with a history of hypertension, diabetes, colon cancer on chemotherapy, Graves disease s/p radioactive iodine, PVD, s/p bypass graft, chronic thrombocytopenia, presenting for evaluation of new onset  seizure last 10/25/2019. He received chemotherapy then later that evening he lay down at bed then came to the kitchen twice, acting unusually. His wife reports he opened his chemo pouch and was messing with his port. She kept reminding him to stop touching it, he said "yeah," and she asked if it was hurting. He went back to the bedroom and she said good night, then heard a noise and found him unresponsive. He started yelling, both hands were flexed to his face that he scratched his face. He was turned to his right side and confused after. The seizure lasted a few minutes. He had 2 more seizures witnessed by EMS and was given Versed. He was initially brought to [facility] where glucose level was 566, he was hypotensive with SBP in the 80s and transferred to [facility]. He had an MRI brain which did not show any acute changes. His EEG showed intermittent generalized 4-5 Hz theta slowing with frequent spikes over the right frontocentral region, maximal at F4. He was discharged home on Levetiracetam 500mg  BID, he denies any side effects. He and his wife deny any prior history of seizures but recall 2 episodes of loss of consciousness, he was in thyroid storm one time. Another time 11 years ago he lost his memory, he was going to [facility] but ended up going to [place], he passed out and then apparently walked out of the hospital, they found him on a train track. His ammonia level was high that time. She denies any staring/unresponsive episodes. He denies any gaps in time. Sometimes he smells Clorox or hand sanitizer. He has been very sensitive to smells, smelling Clorox since diagnosed with cancer.  He has hypnic jerks in sleep that wake him up, none during the day.   Since the seizure, he has had bilateral shoulder/arm pain, with sharp pains when he tries to lift them up. His glucose levels are better with insulin dose changes. He has paresthesias in both feet. He has occasional headaches but has been complaining  of them more recently, with pain over the temples. He has nausea/vomiting with chemotherapy. He has occasional sensation of food getting stuck in his throat. He has chronic back pain. No bowel/bladder dysfunction. Memory is okay. He denies any alcohol use. He has sleep difficulties, melatonin does not help. He had a normal birth and early development.  There is no history of febrile convulsions, CNS infections such as meningitis/encephalitis, significant traumatic brain injury, neurosurgical procedures, or family history of seizures.  Diagnostic Data: MRI brain in 10/2019 did not show any acute changes.  EEG in 10/2019 showed intermittent generalized 4-5 Hz theta slowing with frequent spikes over the right frontocentral region, maximal at F4 24-hour EEG in 04/2020 was abnormal with occasional generalized 4-5 Hz spike and wave discharges consistent with a primary generalized epilepsy.   PAST MEDICAL HISTORY: Past Medical History:  Diagnosis Date   Allergic rhinitis    Anxiety    Arthritis    Chemotherapy-induced thrombocytopenia    Chronic nausea    mild intermittant and loose stools due to colon cancer   Chronic neck and back pain    Depression    ED (erectile dysfunction)    Generalized idiopathic epilepsy and epileptic syndromes, not intractable, without status epilepticus (HCC) 10/25/2019   (11-10-2020 per pt wife last seizure 11/ 2021)  neurologist-- dr Karel Jarvis---  seizure activity/ DKA ;  EEG w/ evidence epileto genicity arising right frontocentral region;  taking keppra   GERD (gastroesophageal reflux disease)    Hiatal hernia    History of 2019 novel coronavirus disease (COVID-19) 12/13/2018   per pt wife , pt had moderate symtptoms that resolved   History of Graves' disease    s/p RAI 02/ 2012;  previous seen by endocrinologist--- dr Everardo All   History of kidney stones    History of non-ST elevation myocardial infarction (NSTEMI)    Jan 2014--  no CAD;  per notes probable coronary  vasospasm   History of panic attacks    History of thyroid storm    Nov 2011 w/ grave's ,  s/p RAI 02/ 2012   Hyperlipidemia    Hypertension    Hypothyroidism following radioiodine therapy    RAI in 02/  2012---  previously followed by dr Everardo All,  now followed by pcp   Lower urinary tract symptoms (LUTS)    Metastatic colon cancer to liver [facility]) 11/2015   oncologist-- sherrill/ and seen by dr a. Maisie Fus;  01-14-2016 s/p sigmoid colectomy, liver bx, node dissection, PAC;   chemo started 09/ 2017  ;  04/ 2021 metastatic pulmonary nodules;   active treatment every 2 wks   Nephrolithiasis    left side non-obstructive per imaging 04/ 2022   PAD (peripheral artery disease) [facility]) cardiologist-  dr Kirke Corin   cardiologist ----  dr Judie Petit. Kirke Corin ;   left popliteal occlusion behind knee w/ collaterals;  s/p attempted angioplasty 01/ 2016;  09-11-2015 s/p pop-TPT w/ ipsNR GSV   PONV (postoperative nausea and vomiting)    Rash    due to chemo   Type 2 diabetes mellitus treated with insulin (HCC)    followed by pcp----   (11-10-2020  per pt wife blood sugar check's 2-4 times daily,  fasting sugar--- 130--168)   Unspecified fracture of left foot, initial encounter for closed fracture 10/17/2020   midfoot    MEDICATIONS: Current Outpatient Medications on File Prior to Visit  Medication Sig Dispense Refill   ACCU-CHEK GUIDE test strip CHECK BLOOD SUGAR THREE TIMES DAILY AS DIRECTED     ACCU-CHEK SOFTCLIX LANCETS lancets USE  TID TO CHECK BLOOD SUGAR LEVELS  0   amLODipine (NORVASC) 5 MG tablet Take 1 tablet (5 mg total) by mouth daily. 90 tablet 3   apixaban (ELIQUIS) 5 MG TABS tablet Take 1 tablet (5 mg total) by mouth 2 (two) times daily. 60 tablet 0   atorvastatin (LIPITOR) 80 MG tablet Take 1 tablet (80 mg total) by mouth daily. TAKE 1 TABLET(80 MG) BY MOUTH AT BEDTIME 90 tablet 3   azithromycin (ZITHROMAX) 250 MG tablet Take 250 mg by mouth as directed.     dapagliflozin propanediol (FARXIGA) 10 MG  TABS tablet Take 10 mg by mouth daily.     diphenoxylate-atropine (LOMOTIL) 2.5-0.025 MG tablet Take 1-2 tablets by mouth 4 (four) times daily as needed for diarrhea or loose stools. 40 tablet 0   docusate sodium (COLACE) 100 MG capsule Take 100 mg by mouth 2 (two) times daily.     fruquintinib (FRUZAQLA) 5 MG capsule Take 1 capsule (5 mg total) by mouth daily. Take for 21 days, then hold for 7 days. Repeat every 28 days. Start cycle on 10/11/22 after MD visit 21 capsule 0   GLOBAL EASE INJECT PEN NEEDLES 31G X 5 MM MISC Inject 1 Syringe into the skin 4 (four) times daily.     Insulin Degludec (TRESIBA FLEXTOUCH [place]) Inject 80 Units into the skin daily.     levETIRAcetam (KEPPRA) 750 MG tablet Take 1 tablet twice a day 180 tablet 3   levothyroxine (SYNTHROID) 137 MCG tablet Take 137 mcg by mouth daily before breakfast.     lidocaine-prilocaine (EMLA) cream Apply 1 Application topically as needed. Apply to portacath site 1 hour prior to use 30 g 3   LORazepam (ATIVAN) 0.5 MG tablet Take 1 tablet (0.5 mg total) by mouth every 8 (eight) hours as needed for anxiety. 30 tablet 0   meclizine (ANTIVERT) 25 MG tablet Take 25 mg by mouth 3 (three) times daily as needed for dizziness.  1   metFORMIN (GLUCOPHAGE-XR) 500 MG 24 hr tablet Take 1,000 mg by mouth in the morning and at bedtime.  3   metoprolol tartrate (LOPRESSOR) 100 MG tablet Take 100 mg by mouth 2 (two) times daily.     mirtazapine (REMERON) 15 MG tablet Take 15 mg by mouth at bedtime.     nitroGLYCERIN (NITROSTAT) 0.4 MG SL tablet DISSOLVE 1 TABLET UNDER TONGUE EVERY 5 MINUTES AS NEEDED FOR CHEST PAIN. IF NO RELIEF AFTER 3 DOSES CALL 911. 25 tablet 5   ondansetron (ZOFRAN) 8 MG tablet Take 1 tablet (8 mg total) by mouth every 8 (eight) hours as needed for nausea or vomiting. 30 tablet 1   oxyCODONE ER (XTAMPZA ER) 13.5 MG C12A Take 13.5 mg by mouth every 12 (twelve) hours.     oxyCODONE-acetaminophen (PERCOCET) 10-325 MG tablet Take 1 tablet by  mouth every 6 (six) hours as needed.     pantoprazole (PROTONIX) 40 MG tablet Take 40 mg by mouth 2 (two) times daily.  5   potassium chloride SA (KLOR-CON M) 20 MEQ tablet TAKE 1 TABLET BY MOUTH  DAILY. 30 tablet 0   promethazine (PHENERGAN) 12.5 MG tablet Take 1 tablet (12.5 mg total) by mouth every 6 (six) hours as needed. 30 tablet 2   sildenafil (VIAGRA) 100 MG tablet Take 100 mg by mouth daily as needed.     tamsulosin (FLOMAX) 0.4 MG CAPS capsule Take 0.4 mg by mouth 2 (two) times daily.     tiZANidine (ZANAFLEX) 4 MG tablet Take 4 mg by mouth 3 (three) times daily.     venlafaxine XR (EFFEXOR-XR) 75 MG 24 hr capsule Take 225 mg by mouth daily.     Vitamin D, Ergocalciferol, (DRISDOL) 1.25 MG (50000 UNIT) CAPS capsule Take 50,000 Units by mouth once a week.     Current Facility-Administered Medications on File Prior to Visit  Medication Dose Route Frequency Provider Last Rate Last Admin   alteplase (CATHFLO ACTIVASE) injection 2 mg  2 mg Intracatheter Once PRN Ladene Artist, MD       sodium chloride flush (NS) 0.9 % injection 10 mL  10 mL Intravenous PRN Ladene Artist, MD   10 mL at 02/04/16 1431   sodium chloride flush (NS) 0.9 % injection 10 mL  10 mL Intravenous PRN Ladene Artist, MD   10 mL at 10/18/16 1610   sodium chloride flush (NS) 0.9 % injection 10 mL  10 mL Intravenous PRN Ladene Artist, MD   10 mL at 05/03/17 0855    ALLERGIES: Allergies  Allergen Reactions   Hydrocodone Hives    FAMILY HISTORY: Family History  Problem Relation Age of Onset   Thyroid disease Mother        hypothyroidism   Heart attack Maternal Grandfather    Heart Problems Father        pacermaker   Edema Father    Heart disease Maternal Grandmother    Lung cancer Maternal Grandmother    Diabetes Maternal Grandmother    Hypertension Maternal Grandmother     SOCIAL HISTORY: Social History   Socioeconomic History   Marital status: Married    Spouse name: Not on file   Number of  children: 5   Years of education: Not on file   Highest education level: Not on file  Occupational History   Occupation: Disabled    Employer: UNEMPLOYED  Tobacco Use   Smoking status: Never   Smokeless tobacco: Never  Vaping Use   Vaping Use: Never used  Substance and Sexual Activity   Alcohol use: Yes    Comment: RARE   Drug use: Never   Sexual activity: Not on file  Other Topics Concern   Not on file  Social History Narrative   Married - wife works Immunologist unit @ [employer]   5 daughters   Regular exercise-no   Disabled to due back problem   12/04/2015   Right handed    Social Determinants of Health   Financial Resource Strain: Not on file  Food Insecurity: No Food Insecurity (10/22/2022)   Hunger Vital Sign    Worried About Running Out of Food in the Last Year: Never true    Ran Out of Food in the Last Year: Never true  Transportation Needs: No Transportation Needs (10/22/2022)   PRAPARE - Administrator, Civil Service (Medical): No    Lack of Transportation (Non-Medical): No  Physical Activity: Not on file  Stress: Not on file  Social Connections: Not on file  Intimate Partner Violence: Not At Risk (10/22/2022)   Humiliation, Afraid, Rape,  and Kick questionnaire    Fear of Current or Ex-Partner: No    Emotionally Abused: No    Physically Abused: No    Sexually Abused: No     PHYSICAL EXAM: Vitals:   11/15/22 1248 11/15/22 1253  BP: (!) 151/89 (!) 149/85  Pulse: (!) 102   SpO2: 99%    General: No acute distress Head:  Normocephalic/atraumatic Skin/Extremities: No rash, no edema Neurological Exam: alert and awake. No aphasia or dysarthria. Fund of knowledge is appropriate. Attention and concentration are normal.   Cranial nerves: Pupils equal, round. Extraocular movements intact with no nystagmus. Visual fields full.  No facial asymmetry.  Motor: Bulk and tone normal, muscle strength 5/5 throughout with no pronator drift. Decreased fine finger  movements on left hand, orbiting around left arm. Sensation intact to temperature. Finger to nose testing intact.  Gait narrow-based and steady, able to tandem walk adequately.    IMPRESSION: This is a pleasant 57 yo RH man with a history of hypertension, diabetes, colon cancer with progression, Graves disease s/p radioactive iodine, PVD, s/p bypass graft, chronic thrombocytopenia, primary generalized epilepsy, presenting for earlier appointment after embolic-appearing right sided stroke that occurred 10/21/2022. He is now on anticoagulation with Eliquis, started due to cancer progression and intermittent palpitations. Exam today overall shows very mild left upper extremity weakness, otherwise he is doing well. No seizures since 12/18/2021, continue Levetiracetam 750mg  BID. Discussed continued control of vascular risk factors for secondary stroke prevention. They know to go to the ER for any sudden change in symptoms. He is aware of [place] driving laws to stop driving after a seizure until 6 months seizure-free. Follow-up  as scheduled in April 2025, call for any changes.    Thank you for allowing me to participate in his care.  Please do not hesitate to call for any questions or concerns.    Patrcia Dolly, M.D.   CC: Dr. Nedra Hai

## 2022-11-15 NOTE — Patient Instructions (Signed)
Medication Instructions:  START Amlodipine 5 mg once daily  *If you need a refill on your cardiac medications before your next appointment, please call your pharmacy*   Lab Work: None ordered If you have labs (blood work) drawn today and your tests are completely normal, you will receive your results only by: MyChart Message (if you have MyChart) OR A paper copy in the mail If you have any lab test that is abnormal or we need to change your treatment, we will call you to review the results.   Testing/Procedures: None ordered   Follow-Up: At [facility], you and your health needs are our priority.  As part of our continuing mission to provide you with exceptional heart care, we have created designated Provider Care Teams.  These Care Teams include your primary Cardiologist (physician) and Advanced Practice Providers (APPs -  Physician Assistants and Nurse Practitioners) who all work together to provide you with the care you need, when you need it.  We recommend signing up for the patient portal called "MyChart".  Sign up information is provided on this After Visit Summary.  MyChart is used to connect with patients for Virtual Visits (Telemedicine).  Patients are able to view lab/test results, encounter notes, upcoming appointments, etc.  Non-urgent messages can be sent to your provider as well.   To learn more about what you can do with MyChart, go to [email or web address].    Your next appointment:   6 month(s)  Provider:   Lorine Bears, MD

## 2022-11-16 ENCOUNTER — Inpatient Hospital Stay: Payer: Medicaid Other

## 2022-11-16 VITALS — BP 137/94 | HR 100 | Temp 98.4°F | Resp 18

## 2022-11-16 DIAGNOSIS — C187 Malignant neoplasm of sigmoid colon: Secondary | ICD-10-CM

## 2022-11-16 DIAGNOSIS — Z5111 Encounter for antineoplastic chemotherapy: Secondary | ICD-10-CM | POA: Diagnosis not present

## 2022-11-16 MED ORDER — HEPARIN SOD (PORK) LOCK FLUSH 100 UNIT/ML IV SOLN
500.0000 [IU] | Freq: Once | INTRAVENOUS | Status: AC | PRN
  Administered 2022-11-16: 500 [IU]

## 2022-11-16 MED ORDER — SODIUM CHLORIDE 0.9% FLUSH
10.0000 mL | INTRAVENOUS | Status: DC | PRN
  Administered 2022-11-16: 10 mL

## 2022-11-16 NOTE — Patient Instructions (Signed)
Heparin Injection What is this medication? HEPARIN INJECTION (HEP a rin) prevents and treats blood clots. It belongs to a group of medications called blood thinners. This medicine may be used for other purposes; ask your health care provider or pharmacist if you have questions. COMMON BRAND NAME(S): Hep-Lock, Hep-Lock U/P, Hepflush-10, Monoject Prefill Advanced Heparin Lock Flush, SASH Normal Saline and Heparin What should I tell my care team before I take this medication? They need to know if you have any of these conditions: Bleeding disorders, such as hemophilia or low blood platelets Bowel disease or diverticulitis Endocarditis High blood pressure Liver disease Recent surgery Stomach ulcers An unusual or allergic reaction to heparin, other medications, foods, dyes, or preservatives Pregnant, trying to get pregnant, or recently pregnant Breastfeeding How should I use this medication? This medication is injected into a vein or under the skin. It is usually given by your care team in a hospital or clinic setting. If you get this medication at home, you will be taught how to prepare and give it. For your therapy to work as well as possible, take each dose exactly as prescribed on the prescription label. Do not skip doses. Skipping doses or stopping this medication can increase your risk of a blood clot. Keep taking this medication unless your care team tells you to stop. It is important that you put your used needles and syringes in a special sharps container. Do not put them in a trash can. If you do not have a sharps container, call your pharmacist or care team to get one. Talk to your care team about the use of this medication in children. While this medication may be prescribed for children for selected conditions, precautions do apply. Overdosage: If you think you have taken too much of this medicine contact a poison control center or emergency room at once. NOTE: This medicine is only  for you. Do not share this medicine with others. What if I miss a dose? If you miss a dose, take it as soon as you can. If it is almost time for your next dose, take only that dose. Do not take double or extra doses. What may interact with this medication? Do not take this medication with any of the following: Aspirin and aspirin-like medications Mifepristone Medications that treat or prevent blood clots, such as warfarin, enoxaparin, dalteparin Palifermin Protamine This medication may also interact with the following: Dextran Digoxin Hydroxychloroquine Medications for treating colds or allergies Nicotine NSAIDs, medications for pain and inflammation, such as ibuprofen or naproxen Phenylbutazone Tetracycline antibiotics This list may not describe all possible interactions. Give your health care provider a list of all the medicines, herbs, non-prescription drugs, or dietary supplements you use. Also tell them if you smoke, drink alcohol, or use illegal drugs. Some items may interact with your medicine. What should I watch for while using this medication? Visit your care team for regular checks on your progress. You may need blood work while taking this medication. Your condition will be monitored carefully while you are receiving this medication. Wear a medical ID bracelet or chain, and carry a card that describes your disease and details of your medication and dosage times. Notify your care team at once if you have cold, blue hands or feet. If you are going to need surgery or other procedure, tell your care team that you are using this medication. Avoid sports and activities that might cause injury while you are using this medication. Severe falls or injuries can   cause unseen bleeding. Be careful when using sharp tools or knives. Consider using an electric razor. Take special care brushing or flossing your teeth. Report any injuries, bruising, or red spots on the skin to your care  team. Using this medication for a long time may weaken your bones. The risk of bone fractures may be increased. Talk to your care team about your bone health. You should make sure that you get enough calcium and vitamin D while you are taking this medication. Discuss the foods you eat and the vitamins you take with your care team. Wear a medical ID bracelet or chain. Carry a card that describes your condition. List the medications and doses you take on the card. What side effects may I notice from receiving this medication? Side effects that you should report to your care team as soon as possible: Allergic reactions--skin rash, itching, hives, swelling of the face, lips, tongue, or throat Bleeding--bloody or black, tar-like stools, vomiting blood or brown material that looks like coffee grounds, red or dark brown urine, small red or purple spots on skin, unusual bruising or bleeding Blood clot--pain, swelling, or warmth in the leg, shortness of breath, chest pain Side effects that usually do not require medical attention (report to your care team if they continue or are bothersome): Pain, redness, or irritation at injection site This list may not describe all possible side effects. Call your doctor for medical advice about side effects. You may report side effects to FDA at 1-800-FDA-1088. Where should I keep my medication? Keep out of the reach of children and pets. Store unopened vials at room temperature between 15 and 30 degrees C (59 and 86 degrees F). Do not freeze. Do not use if solution is discolored or particulate matter is present. To get rid of medications that are no longer needed or have expired: Take the medication to a medication take-back program. Check with your pharmacy or law enforcement to find a location. If you cannot return the medication, ask your pharmacist or care team how to get rid of this medication safely. NOTE: This sheet is a summary. It may not cover all possible  information. If you have questions about this medicine, talk to your doctor, pharmacist, or health care provider.  2024 Elsevier/Gold Standard (2021-10-21 00:00:00)  

## 2022-11-18 ENCOUNTER — Telehealth: Payer: Self-pay | Admitting: Cardiovascular Disease

## 2022-11-18 ENCOUNTER — Other Ambulatory Visit: Payer: Self-pay

## 2022-11-18 MED ORDER — APIXABAN 5 MG PO TABS: 5.0000 mg | ORAL_TABLET | Freq: Two times a day (BID) | ORAL | 1 refills | Status: DC

## 2022-11-18 NOTE — Telephone Encounter (Signed)
°*  STAT* If patient is at the pharmacy, call can be transferred to refill team. ° ° °1. Which medications need to be refilled? (please list name of each medication and dose if known) apixaban (ELIQUIS) 5 MG TABS tablet ° °2. Which pharmacy/location (including street and city if local pharmacy) is medication to be sent to? [id number] - [place], [place] - [address] AT [address] ° °3. Do they need a 30 day or 90 day supply? 90 ° °

## 2022-11-18 NOTE — Telephone Encounter (Signed)
Prescription refill request for Eliquis received. Indication:[facility] Last office visit:7/24 Scr:1.36  7/24 Age: 57 Weight:95.2  KG  PRESCRIPTION REFILLED

## 2022-11-22 ENCOUNTER — Ambulatory Visit: Payer: Medicaid Other | Admitting: Occupational Therapy

## 2022-11-27 ENCOUNTER — Other Ambulatory Visit: Payer: Self-pay | Admitting: Oncology

## 2022-11-29 ENCOUNTER — Inpatient Hospital Stay: Payer: Medicaid Other

## 2022-11-29 ENCOUNTER — Inpatient Hospital Stay: Payer: Medicaid Other | Admitting: Nurse Practitioner

## 2022-11-29 ENCOUNTER — Encounter: Payer: Self-pay | Admitting: Nurse Practitioner

## 2022-11-29 ENCOUNTER — Ambulatory Visit: Payer: Medicaid Other | Admitting: Occupational Therapy

## 2022-11-29 VITALS — BP 146/89 | HR 86 | Temp 98.2°F | Resp 18 | Ht 70.0 in | Wt 210.2 lb

## 2022-11-29 VITALS — BP 145/85 | HR 84

## 2022-11-29 DIAGNOSIS — C187 Malignant neoplasm of sigmoid colon: Secondary | ICD-10-CM

## 2022-11-29 DIAGNOSIS — C189 Malignant neoplasm of colon, unspecified: Secondary | ICD-10-CM | POA: Diagnosis not present

## 2022-11-29 DIAGNOSIS — C787 Secondary malignant neoplasm of liver and intrahepatic bile duct: Secondary | ICD-10-CM | POA: Diagnosis not present

## 2022-11-29 DIAGNOSIS — H6691 Otitis media, unspecified, right ear: Secondary | ICD-10-CM | POA: Diagnosis not present

## 2022-11-29 DIAGNOSIS — Z5111 Encounter for antineoplastic chemotherapy: Secondary | ICD-10-CM | POA: Diagnosis not present

## 2022-11-29 LAB — CBC WITH DIFFERENTIAL (CANCER CENTER ONLY)
Abs Immature Granulocytes: 0.01 10*3/uL (ref 0.00–0.07)
Basophils Absolute: 0 10*3/uL (ref 0.0–0.1)
Basophils Relative: 0 %
Eosinophils Absolute: 0.1 10*3/uL (ref 0.0–0.5)
Eosinophils Relative: 1 %
HCT: 32 % — ABNORMAL LOW (ref 39.0–52.0)
Hemoglobin: 10.2 g/dL — ABNORMAL LOW (ref 13.0–17.0)
Immature Granulocytes: 0 %
Lymphocytes Relative: 31 %
Lymphs Abs: 1.8 10*3/uL (ref 0.7–4.0)
MCH: 28.4 pg (ref 26.0–34.0)
MCHC: 31.9 g/dL (ref 30.0–36.0)
MCV: 89.1 fL (ref 80.0–100.0)
Monocytes Absolute: 0.7 10*3/uL (ref 0.1–1.0)
Monocytes Relative: 11 %
Neutro Abs: 3.4 10*3/uL (ref 1.7–7.7)
Neutrophils Relative %: 57 %
Platelet Count: 94 10*3/uL — ABNORMAL LOW (ref 150–400)
RBC: 3.59 MIL/uL — ABNORMAL LOW (ref 4.22–5.81)
RDW: 16.2 % — ABNORMAL HIGH (ref 11.5–15.5)
WBC Count: 6 10*3/uL (ref 4.0–10.5)
nRBC: 0 % (ref 0.0–0.2)

## 2022-11-29 LAB — CMP (CANCER CENTER ONLY)
ALT: 15 U/L (ref 0–44)
AST: 20 U/L (ref 15–41)
Albumin: 3.8 g/dL (ref 3.5–5.0)
Alkaline Phosphatase: 85 U/L (ref 38–126)
Anion gap: 9 (ref 5–15)
BUN: 9 mg/dL (ref 6–20)
CO2: 27 mmol/L (ref 22–32)
Calcium: 8.8 mg/dL — ABNORMAL LOW (ref 8.9–10.3)
Chloride: 98 mmol/L (ref 98–111)
Creatinine: 1.14 mg/dL (ref 0.61–1.24)
GFR, Estimated: >60 mL/min (ref >60)
Glucose, Bld: 336 mg/dL — ABNORMAL HIGH (ref 70–99)
Potassium: 3.8 mmol/L (ref 3.5–5.1)
Sodium: 134 mmol/L — ABNORMAL LOW (ref 135–145)
Total Bilirubin: 0.6 mg/dL (ref 0.3–1.2)
Total Protein: 7 g/dL (ref 6.5–8.1)

## 2022-11-29 MED ORDER — LEUCOVORIN CALCIUM INJECTION 350 MG
400.0000 mg/m2 | Freq: Once | INTRAVENOUS | Status: AC
  Administered 2022-11-29: 872 mg via INTRAVENOUS
  Filled 2022-11-29: qty 43.6

## 2022-11-29 MED ORDER — SODIUM CHLORIDE 0.9 % IV SOLN
2000.0000 mg/m2 | INTRAVENOUS | Status: DC
  Administered 2022-11-29: 4350 mg via INTRAVENOUS
  Filled 2022-11-29: qty 87

## 2022-11-29 MED ORDER — LORAZEPAM 0.5 MG PO TABS: 0.5000 mg | ORAL_TABLET | Freq: Three times a day (TID) | ORAL | 0 refills | Status: DC | PRN

## 2022-11-29 MED ORDER — DIPHENHYDRAMINE HCL 50 MG/ML IJ SOLN
25.0000 mg | Freq: Once | INTRAMUSCULAR | Status: AC
  Administered 2022-11-29: 25 mg via INTRAVENOUS
  Filled 2022-11-29: qty 1

## 2022-11-29 MED ORDER — SODIUM CHLORIDE 0.9% FLUSH
10.0000 mL | INTRAVENOUS | Status: DC | PRN
  Administered 2022-11-29: 10 mL

## 2022-11-29 MED ORDER — DEXTROSE 5 % IV SOLN: Freq: Once | INTRAVENOUS | Status: AC

## 2022-11-29 MED ORDER — PALONOSETRON HCL INJECTION 0.25 MG/5ML
0.2500 mg | Freq: Once | INTRAVENOUS | Status: AC
  Administered 2022-11-29: 0.25 mg via INTRAVENOUS
  Filled 2022-11-29: qty 5

## 2022-11-29 MED ORDER — AMOXICILLIN-POT CLAVULANATE 500-125 MG PO TABS
1.0000 | ORAL_TABLET | Freq: Three times a day (TID) | ORAL | 0 refills | Status: AC
Start: 2022-11-29 — End: 2022-12-06

## 2022-11-29 MED ORDER — SODIUM CHLORIDE 0.9 % IV SOLN
10.0000 mg | Freq: Once | INTRAVENOUS | Status: AC
  Administered 2022-11-29: 10 mg via INTRAVENOUS
  Filled 2022-11-29: qty 10

## 2022-11-29 MED ORDER — OXALIPLATIN CHEMO INJECTION 100 MG/20ML
50.0000 mg/m2 | Freq: Once | INTRAVENOUS | Status: AC
  Administered 2022-11-29: 100 mg via INTRAVENOUS
  Filled 2022-11-29: qty 20

## 2022-11-29 MED ORDER — BENZONATATE 100 MG PO CAPS
100.0000 mg | ORAL_CAPSULE | Freq: Two times a day (BID) | ORAL | 0 refills | Status: DC | PRN
Start: 2022-11-29 — End: 2022-12-27

## 2022-11-29 MED ORDER — FLUOROURACIL CHEMO INJECTION 2.5 GM/50ML
400.0000 mg/m2 | Freq: Once | INTRAVENOUS | Status: AC
  Administered 2022-11-29: 850 mg via INTRAVENOUS
  Filled 2022-11-29: qty 17

## 2022-11-29 MED ORDER — FAMOTIDINE IN NACL 20-0.9 MG/50ML-% IV SOLN
20.0000 mg | Freq: Once | INTRAVENOUS | Status: AC
  Administered 2022-11-29: 20 mg via INTRAVENOUS
  Filled 2022-11-29: qty 50

## 2022-11-29 NOTE — Progress Notes (Signed)
[facility] OFFICE PROGRESS NOTE   Diagnosis: Colon cancer  INTERVAL HISTORY:   Jeff [name] returns as scheduled.  He completed cycle 1 FOLFOX 11/14/2022.  He denies significant nausea.  No mouth sores.  No diarrhea.  Cold sensitivity lasted about a day.  He reports a cough and right ear discomfort.  No fever.  Objective:  Vital signs in last 24 hours:  Blood pressure (!) 146/89, pulse 86, temperature 98.2 F (36.8 C), temperature source Oral, resp. rate 18, height 5\' 10"  (1.778 m), weight 210 lb 3.2 oz (95.3 kg), SpO2 100%.    HEENT: No thrush or ulcers.  Right tympanic membrane appears dull. Resp: Lungs clear bilaterally. Cardio: Regular rate and rhythm. GI: No hepatosplenomegaly. Vascular: No leg edema. Neuro: Alert and oriented. Skin: Palms without erythema. Port-A-Cath without erythema.  Lab Results:  Lab Results  Component Value Date   WBC 6.0 11/29/2022   HGB 10.2 (L) 11/29/2022   HCT 32.0 (L) 11/29/2022   MCV 89.1 11/29/2022   PLT 94 (L) 11/29/2022   NEUTROABS 3.4 11/29/2022    Imaging:  No results found.  Medications: I have reviewed the patient's current medications.  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, biopsy confirmed adenocarcinoma         CTs chest, abdomen, and pelvis on 12/11/2015-indeterminate tiny pulmonary nodules, multiple liver metastases, small nodes in the sigmoid mesocolon Laparoscopic sigmoid colectomy, liver biopsy, Port-A-Cath placement 01/14/2016 Pathology sigmoid colon resection- colonic adenocarcinoma, 5.3 cm extending into pericolonic connective tissue, positive lymph-vascular invasion, positive perineural invasion, negative margins, metastatic carcinoma in 9 of 28 lymph nodes Pathology liver biopsy-metastatic colorectal adenocarcinoma MSI stable; mismatch repair protein normal APC alteration, K RAS wild-type, no BRAF mutation Cycle 1 FOLFOX  02/02/2016 Cycle 2 FOLFOX 02/15/2016 Cycle 3 FOLFOX 02/29/2016 Cycle 4 FOLFOX 03/14/2016 Cycle 5 FOLFOX 03/28/2016 Cycle 6 FOLFOX 04/11/2016 (oxaliplatin held secondary to thrombocytopenia) 04/21/2016 restaging CTs-difficulty evaluating liver lesions due to hepatic steatosis. Stable right adrenal nodule. No adenopathy or local recurrence near the rectosigmoid anastomotic site. Cycle 7 FOLFOX 04/25/2016 MRI liver 05/02/2016-partial improvement in hepatic metastases Cycle 8 FOLFOX 05/10/2016 Cycle 9 FOLFOX 05/23/2016 (oxaliplatin held due to thrombocytopenia) Cycle 10 FOLFOX 06/06/2016 (oxaliplatin dose reduced due to thrombocytopenia) Cycle 11 FOLFOX 06/27/2016 (oxaliplatin held due to neuropathy) Cycle 12 FOLFOX 07/11/2016 (oxaliplatin held) Initiation of maintenance Xeloda 7 days on/7 days off 07/27/2016 MRI liver 11/18/2016-decrease in hepatic metastatic disease. No new or progressive disease identified within the abdomen. Continuation of Xeloda 7 days on/7 days off MRI liver 04/27/2017-previous liver lesions not identified, no new lesions, no lymphadenopathy Xeloda continued 7 days on/7 days off MRI liver 12/04/2017 - no evidence of metastatic disease, hepatic steatosis Xeloda continued 7 days on/7 days off MRI liver 07/15/2018- no evidence of metastatic disease.  Stable severe hepatic steatosis. Xeloda continued 7 days on/7 days off MRI liver 03/16/2019-hepatic steatosis, no liver mass, focal area of intrahepatic biliary dilatation in segments 2 and 3 of the left lobe-increased Xeloda continued 7 days on/7 days off MRI abdomen 08/19/2019-no findings to suggest liver metastases.  Bilateral lung nodules measuring up to 2.8 cm, progressive and more conspicuous than on previous exam CT chest 08/29/2019-multiple pulmonary metastases, new from 04/21/2016 Cycle 1 FOLFIRI/bevacizumab 09/09/2019 Cycle 2 FOLFIRI/bevacizumab 09/26/2019  Cycle 3 FOLFIRI/bevacizumab 10/10/2019 Cycle 4 FOLFIRI/bevacizumab  10/24/2019 Cycle 5 FOLFIRI/bevacizumab 11/07/2019 CT chest 11/14/2019-decreased size of lung nodules, no new lesions, hepatic steatosis Cycle 6 FOLFIRI/bevacizumab 11/21/2019 Cycle 7 FOLFIRI/bevacizumab 12/05/2019  Cycle 8 FOLFIRI/bevacizumab 12/19/2019 Cycle 9 FOLFIRI/bevacizumab 01/02/2020 Cycle 10 FOLFIRI/bevacizumab 01/16/2020 CT chest 01/29/2020-stable bilateral pulmonary metastases.  No new or progressive metastatic disease in the chest. Cycle 11 FOLFIRI/bevacizumab 01/30/2020 Cycle 12 FOLFIRI/bevacizumab 02/19/2020 Cycle 13 FOLFIRI/bevacizumab 03/12/2020 Cycle 14 FOLFIRI/bevacizumab 04/02/2020 Cycle 15 FOLFIRI/bevacizumab 04/23/2020 Cycle 16 FOLFIRI/bevacizumab 05/21/2020 CT chest 06/09/2020-mild progression pulmonary metastasis.  Some lesions have increased in size while others are similar. Cycle 1 irinotecan/Panitumumab 06/18/2020 Cycle 2 irinotecan/Panitumumab 07/02/2020 Cycle 3 irinotecan/panitumumab 07/16/2020 Cycle 4 irinotecan/Panitumumab 07/30/2020 Cycle 5 irinotecan/Panitumumab 08/13/2020, Emend added Cycle 6 irinotecan/Panitumumab 08/27/2020 CT chest 09/08/2020-decreased size of pulmonary nodules, no progressive disease Cycle 7 irinotecan/panitumumab 09/02/2020 Cycle 8 irinotecan/panitumumab 09/24/2020 Cycle 9 irinotecan/Panitumumab 10/08/2020 10/22/2020 treatment held due to left foot fracture, need for surgery Cycle 10 irinotecan/panitumumab 11/24/2020 Cycle 11 irinotecan/Panitumumab 12/08/2020 Cycle 12 irinotecan/panitumumab 12/22/2020 Cycle 13 irinotecan/panitumumab 01/05/2021 01/20/2021 CT chest-mixed response with minimal increase in size of some lesions and minimal decrease in the size of other lesions.  Overall number of lesions is unchanged. Cycle 14 irinotecan/Panitumumab 02/05/2021 Cycle 15 irinotecan/Panitumumab 02/23/2021 Cycle 16 irinotecan/panitumumab 03/16/2021 Cycle 17 irinotecan/Panitumumab 04/13/2021 05/03/2021-CT chest-enlargement of pulmonary metastases, no new lesions 06/21/2021-cycle  1 Lonsurf/bevacizumab 07/19/2021-cycle 2 Lonsurf/bevacizumab 08/16/2021-cycle 3 Lonsurf/bevacizumab 09/09/2021 CT chest-bilateral lung nodules and masses with mixed response, overall stable to very minimally increased 09/13/2021 cycle 4 Lonsurf/bevacizumab 10/12/2021 cycle 5 Lonsurf/bevacizumab 10/21/2021 chest CT-bilateral pulmonary metastases without significant change.  No new or progressive disease within the chest. 11/08/2021 cycle 6 Lonsurf/bevacizumab 12/06/2021 cycle 7 Lonsurf/bevacizumab 01/03/2022 cycle 8 Lonsurf/bevacizumab CTs 01/25/2022-index lung lesion stable to mildly increased in size.  Mild retroperitoneal adenopathy, mildly increased in size compared to 12/16/2020. 02/01/2022 cycle 9 Lonsurf/bevacizumab 02/28/2022 cycle 10 Lonsurf/bevacizumab 03/28/2022 cycle 11 Lonsurf/bevacizumab CTs 04/21/2022-no change in bilateral pulmonary metastases, 2 new hypoattenuating liver lesions, new enlarged portacaval node Cycle 1 fruquintinib 05/24/2022 Cycle 2 fruquintinib 06/21/2022 Cycle 3 fruquintinib 07/19/2022 CTs 08/12/2022-stable lung nodules, hilar and retroperitoneal nodes, slight increase in size of liver metastases, stable right adrenal nodule Cycle 4 fruquintinib 08/16/2022 Cycle 5 fruquintinib 09/13/2022 Cycle 6 fruquintinib 10/11/2022 CTs 10/22/2022-progressive mediastinal, pericardial, and periportal adenopathy, enlargement of liver lesions, stable pulmonary nodules Cycle 1 FOLFOX 11/14/2022 Cycle 2 FOLFOX 11/29/2022, oxaliplatin dose reduced due to thrombocytopenia   2.   Rectal bleeding and constipation secondary to #1   3.   History of peripheral vascular disease, status post left lower extremity vascular bypass surgery in April 2017   4.   History of nephrolithiasis   5.   History of Graves' disease treated with radioactive iodine   6.   Anxiety/depression   7.   Hypertension   8.   Hospitalization 01/19/2016 with wound dehiscence status post secondary suture closure of abdominal wall    9.   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 Hyperglycemia 06/20/2016-diagnosed with diabetes, maintained on insulin   11.  Positive COVID test 12/13/2018; positive COVID test 01/25/2021  Disposition: Jeff [name] appears stable.  He tolerated the first cycle of FOLFOX well.  Plan to proceed with cycle 2 today as scheduled.  He has mild thrombocytopenia.  Oxaliplatin will be dose reduced.  CBC and chemistry panel reviewed.  Labs adequate to proceed with treatment.  He has mild thrombocytopenia.  Oxaliplatin dose reduced.  He will contact the office with bleeding.  He may have an ear infection.  He will complete a 7-day course of Augmentin.  Tessalon Perles for the cough.  He will return for follow-up and treatment in  4 weeks rather than 2 due to a planned event.  He will contact the office in the interim with any problems.  Plan reviewed with Dr. Truett Perna.    Lonna Cobb ANP/GNP-BC   11/29/2022  8:58 AM

## 2022-11-29 NOTE — Patient Instructions (Addendum)
[facility]   The chemotherapy medication bag should finish at 46 hours, 96 hours, or 7 days. For example, if your pump is scheduled for 46 hours and it was put on at 4:00 p.m., it should finish at 2:00 p.m. the day it is scheduled to come off regardless of your appointment time.     Estimated time to finish at 12:30 Thursday, December 01, 2022.   If the display on your pump reads "Low Volume" and it is beeping, take the batteries out of the pump and come to the cancer center for it to be taken off.   If the pump alarms go off prior to the pump reading "Low Volume" then call [phone] and someone can assist you.  If the plunger comes out and the chemotherapy medication is leaking out, please use your home chemo spill kit to clean up the spill. Do NOT use paper towels or other household products.  If you have problems or questions regarding your pump, please call either [phone] (24 hours a day) or the cancer center Monday-Friday 8:00 a.m.- 4:30 p.m. at the clinic number and we will assist you. If you are unable to get assistance, then go to the nearest Emergency Department and ask the staff to contact the IV team for assistance.  Discharge Instructions: Thank you for choosing [facility] to provide your oncology and hematology care.   If you have a lab appointment with the Cancer Center, please go directly to the Cancer Center and check in at the registration area.   Wear comfortable clothing and clothing appropriate for easy access to any Portacath or PICC line.   We strive to give you quality time with your provider. You may need to reschedule your appointment if you arrive late (15 or more minutes).  Arriving late affects you and other patients whose appointments are after yours.  Also, if you miss three or more appointments without notifying the office, you may be dismissed from the clinic at the provider's discretion.      For  prescription refill requests, have your pharmacy contact our office and allow 72 hours for refills to be completed.    Today you received the following chemotherapy and/or immunotherapy agents Oxaliplatin, Leucovorin, Fluorouracil.      To help prevent nausea and vomiting after your treatment, we encourage you to take your nausea medication as directed.  BELOW ARE SYMPTOMS THAT SHOULD BE REPORTED IMMEDIATELY: *FEVER GREATER THAN 100.4 F (38 C) OR HIGHER *CHILLS OR SWEATING *NAUSEA AND VOMITING THAT IS NOT CONTROLLED WITH YOUR NAUSEA MEDICATION *UNUSUAL SHORTNESS OF BREATH *UNUSUAL BRUISING OR BLEEDING *URINARY PROBLEMS (pain or burning when urinating, or frequent urination) *BOWEL PROBLEMS (unusual diarrhea, constipation, pain near the anus) TENDERNESS IN MOUTH AND THROAT WITH OR WITHOUT PRESENCE OF ULCERS (sore throat, sores in mouth, or a toothache) UNUSUAL RASH, SWELLING OR PAIN  UNUSUAL VAGINAL DISCHARGE OR ITCHING   Items with * indicate a potential emergency and should be followed up as soon as possible or go to the Emergency Department if any problems should occur.  Please show the CHEMOTHERAPY ALERT CARD or IMMUNOTHERAPY ALERT CARD at check-in to the Emergency Department and triage nurse.  Should you have questions after your visit or need to cancel or reschedule your appointment, please contact [facility]  Dept: [phone]  and follow the prompts.  Office hours are 8:00 a.m. to 4:30 p.m. Monday - Friday. Please note  that voicemails left after 4:00 p.m. may not be returned until the following business day.  We are closed weekends and major holidays. You have access to a nurse at all times for urgent questions. Please call the main number to the clinic Dept: [phone] and follow the prompts.   For any non-urgent questions, you may also contact your provider using MyChart. We now offer e-Visits for anyone 31 and older to request care online  for non-urgent symptoms. For details visit [email or web address].   Also download the MyChart app! Go to the app store, search "MyChart", open the app, select [place], and log in with your MyChart username and password.  Oxaliplatin Injection What is this medication? OXALIPLATIN (ox AL i PLA tin) treats colorectal cancer. It works by slowing down the growth of cancer cells. This medicine may be used for other purposes; ask your health care provider or pharmacist if you have questions. COMMON BRAND NAME(S): Eloxatin What should I tell my care team before I take this medication? They need to know if you have any of these conditions: Heart disease History of irregular heartbeat or rhythm Liver disease Low blood cell levels (white cells, red cells, and platelets) Lung or breathing disease, such as asthma Take medications that treat or prevent blood clots Tingling of the fingers, toes, or other nerve disorder An unusual or allergic reaction to oxaliplatin, other medications, foods, dyes, or preservatives If you or your partner are pregnant or trying to get pregnant Breast-feeding How should I use this medication? This medication is injected into a vein. It is given by your care team in a hospital or clinic setting. Talk to your care team about the use of this medication in children. Special care may be needed. Overdosage: If you think you have taken too much of this medicine contact a poison control center or emergency room at once. NOTE: This medicine is only for you. Do not share this medicine with others. What if I miss a dose? Keep appointments for follow-up doses. It is important not to miss a dose. Call your care team if you are unable to keep an appointment. What may interact with this medication? Do not take this medication with any of the following: Cisapride Dronedarone Pimozide Thioridazine This medication may also interact with the following: Aspirin and aspirin-like  medications Certain medications that treat or prevent blood clots, such as warfarin, apixaban, dabigatran, and rivaroxaban Cisplatin Cyclosporine Diuretics Medications for infection, such as acyclovir, adefovir, amphotericin B, bacitracin, cidofovir, foscarnet, ganciclovir, gentamicin, pentamidine, vancomycin NSAIDs, medications for pain and inflammation, such as ibuprofen or naproxen Other medications that cause heart rhythm changes Pamidronate Zoledronic acid This list may not describe all possible interactions. Give your health care provider a list of all the medicines, herbs, non-prescription drugs, or dietary supplements you use. Also tell them if you smoke, drink alcohol, or use illegal drugs. Some items may interact with your medicine. What should I watch for while using this medication? Your condition will be monitored carefully while you are receiving this medication. You may need blood work while taking this medication. This medication may make you feel generally unwell. This is not uncommon as chemotherapy can affect healthy cells as well as cancer cells. Report any side effects. Continue your course of treatment even though you feel ill unless your care team tells you to stop. This medication may increase your risk of getting an infection. Call your care team for advice if you get a fever, chills, sore throat, or  other symptoms of a cold or flu. Do not treat yourself. Try to avoid being around people who are sick. Avoid taking medications that contain aspirin, acetaminophen, ibuprofen, naproxen, or ketoprofen unless instructed by your care team. These medications may hide a fever. Be careful brushing or flossing your teeth or using a toothpick because you may get an infection or bleed more easily. If you have any dental work done, tell your dentist you are receiving this medication. This medication can make you more sensitive to cold. Do not drink cold drinks or use ice. Cover exposed  skin before coming in contact with cold temperatures or cold objects. When out in cold weather wear warm clothing and cover your mouth and nose to warm the air that goes into your lungs. Tell your care team if you get sensitive to the cold. Talk to your care team if you or your partner are pregnant or think either of you might be pregnant. This medication can cause serious birth defects if taken during pregnancy and for 9 months after the last dose. A negative pregnancy test is required before starting this medication. A reliable form of contraception is recommended while taking this medication and for 9 months after the last dose. Talk to your care team about effective forms of contraception. Do not father a child while taking this medication and for 6 months after the last dose. Use a condom while having sex during this time period. Do not breastfeed while taking this medication and for 3 months after the last dose. This medication may cause infertility. Talk to your care team if you are concerned about your fertility. What side effects may I notice from receiving this medication? Side effects that you should report to your care team as soon as possible: Allergic reactions--skin rash, itching, hives, swelling of the face, lips, tongue, or throat Bleeding--bloody or black, tar-like stools, vomiting blood or brown material that looks like coffee grounds, red or dark brown urine, small red or purple spots on skin, unusual bruising or bleeding Dry cough, shortness of breath or trouble breathing Heart rhythm changes--fast or irregular heartbeat, dizziness, feeling faint or lightheaded, chest pain, trouble breathing Infection--fever, chills, cough, sore throat, wounds that don't heal, pain or trouble when passing urine, general feeling of discomfort or being unwell Liver injury--right upper belly pain, loss of appetite, nausea, light-colored stool, dark yellow or brown urine, yellowing skin or eyes, unusual  weakness or fatigue Low red blood cell level--unusual weakness or fatigue, dizziness, headache, trouble breathing Muscle injury--unusual weakness or fatigue, muscle pain, dark yellow or brown urine, decrease in amount of urine Pain, tingling, or numbness in the hands or feet Sudden and severe headache, confusion, change in vision, seizures, which may be signs of posterior reversible encephalopathy syndrome (PRES) Unusual bruising or bleeding Side effects that usually do not require medical attention (report to your care team if they continue or are bothersome): Diarrhea Nausea Pain, redness, or swelling with sores inside the mouth or throat Unusual weakness or fatigue Vomiting This list may not describe all possible side effects. Call your doctor for medical advice about side effects. You may report side effects to FDA at 1-800-FDA-1088. Where should I keep my medication? This medication is given in a hospital or clinic. It will not be stored at home. NOTE: This sheet is a summary. It may not cover all possible information. If you have questions about this medicine, talk to your doctor, pharmacist, or health care provider.  2024 Elsevier/Gold Standard (  2022-07-10 00:00:00)  Leucovorin Injection What is this medication? LEUCOVORIN (loo koe VOR in) prevents side effects from certain medications, such as methotrexate. It works by increasing folate levels. This helps protect healthy cells in your body. It may also be used to treat anemia caused by low levels of folate. It can also be used with fluorouracil, a type of chemotherapy, to treat colorectal cancer. It works by increasing the effects of fluorouracil in the body. This medicine may be used for other purposes; ask your health care provider or pharmacist if you have questions. What should I tell my care team before I take this medication? They need to know if you have any of these conditions: Anemia from low levels of vitamin B12 in the  blood An unusual or allergic reaction to leucovorin, folic acid, other medications, foods, dyes, or preservatives Pregnant or trying to get pregnant Breastfeeding How should I use this medication? This medication is injected into a vein or a muscle. It is given by your care team in a hospital or clinic setting. Talk to your care team about the use of this medication in children. Special care may be needed. Overdosage: If you think you have taken too much of this medicine contact a poison control center or emergency room at once. NOTE: This medicine is only for you. Do not share this medicine with others. What if I miss a dose? Keep appointments for follow-up doses. It is important not to miss your dose. Call your care team if you are unable to keep an appointment. What may interact with this medication? Capecitabine Fluorouracil Phenobarbital Phenytoin Primidone Trimethoprim;sulfamethoxazole This list may not describe all possible interactions. Give your health care provider a list of all the medicines, herbs, non-prescription drugs, or dietary supplements you use. Also tell them if you smoke, drink alcohol, or use illegal drugs. Some items may interact with your medicine. What should I watch for while using this medication? Your condition will be monitored carefully while you are receiving this medication. This medication may increase the side effects of 5-fluorouracil. Tell your care team if you have diarrhea or mouth sores that do not get better or that get worse. What side effects may I notice from receiving this medication? Side effects that you should report to your care team as soon as possible: Allergic reactions--skin rash, itching, hives, swelling of the face, lips, tongue, or throat This list may not describe all possible side effects. Call your doctor for medical advice about side effects. You may report side effects to FDA at 1-800-FDA-1088. Where should I keep my  medication? This medication is given in a hospital or clinic. It will not be stored at home. NOTE: This sheet is a summary. It may not cover all possible information. If you have questions about this medicine, talk to your doctor, pharmacist, or health care provider.  2024 Elsevier/Gold Standard (2021-10-05 00:00:00)  Fluorouracil Injection What is this medication? FLUOROURACIL (flure oh YOOR a sil) treats some types of cancer. It works by slowing down the growth of cancer cells. This medicine may be used for other purposes; ask your health care provider or pharmacist if you have questions. COMMON BRAND NAME(S): Adrucil What should I tell my care team before I take this medication? They need to know if you have any of these conditions: Blood disorders Dihydropyrimidine dehydrogenase (DPD) deficiency Infection, such as chickenpox, cold sores, herpes Kidney disease Liver disease Poor nutrition Recent or ongoing radiation therapy An unusual or allergic reaction to  fluorouracil, other medications, foods, dyes, or preservatives If you or your partner are pregnant or trying to get pregnant Breast-feeding How should I use this medication? This medication is injected into a vein. It is administered by your care team in a hospital or clinic setting. Talk to your care team about the use of this medication in children. Special care may be needed. Overdosage: If you think you have taken too much of this medicine contact a poison control center or emergency room at once. NOTE: This medicine is only for you. Do not share this medicine with others. What if I miss a dose? Keep appointments for follow-up doses. It is important not to miss your dose. Call your care team if you are unable to keep an appointment. What may interact with this medication? Do not take this medication with any of the following: Live virus vaccines This medication may also interact with the following: Medications that treat  or prevent blood clots, such as warfarin, enoxaparin, dalteparin This list may not describe all possible interactions. Give your health care provider a list of all the medicines, herbs, non-prescription drugs, or dietary supplements you use. Also tell them if you smoke, drink alcohol, or use illegal drugs. Some items may interact with your medicine. What should I watch for while using this medication? Your condition will be monitored carefully while you are receiving this medication. This medication may make you feel generally unwell. This is not uncommon as chemotherapy can affect healthy cells as well as cancer cells. Report any side effects. Continue your course of treatment even though you feel ill unless your care team tells you to stop. In some cases, you may be given additional medications to help with side effects. Follow all directions for their use. This medication may increase your risk of getting an infection. Call your care team for advice if you get a fever, chills, sore throat, or other symptoms of a cold or flu. Do not treat yourself. Try to avoid being around people who are sick. This medication may increase your risk to bruise or bleed. Call your care team if you notice any unusual bleeding. Be careful brushing or flossing your teeth or using a toothpick because you may get an infection or bleed more easily. If you have any dental work done, tell your dentist you are receiving this medication. Avoid taking medications that contain aspirin, acetaminophen, ibuprofen, naproxen, or ketoprofen unless instructed by your care team. These medications may hide a fever. Do not treat diarrhea with over the counter products. Contact your care team if you have diarrhea that lasts more than 2 days or if it is severe and watery. This medication can make you more sensitive to the sun. Keep out of the sun. If you cannot avoid being in the sun, wear protective clothing and sunscreen. Do not use sun lamps,  tanning beds, or tanning booths. Talk to your care team if you or your partner wish to become pregnant or think you might be pregnant. This medication can cause serious birth defects if taken during pregnancy and for 3 months after the last dose. A reliable form of contraception is recommended while taking this medication and for 3 months after the last dose. Talk to your care team about effective forms of contraception. Do not father a child while taking this medication and for 3 months after the last dose. Use a condom while having sex during this time period. Do not breastfeed while taking this medication. This medication  may cause infertility. Talk to your care team if you are concerned about your fertility. What side effects may I notice from receiving this medication? Side effects that you should report to your care team as soon as possible: Allergic reactions--skin rash, itching, hives, swelling of the face, lips, tongue, or throat Heart attack--pain or tightness in the chest, shoulders, arms, or jaw, nausea, shortness of breath, cold or clammy skin, feeling faint or lightheaded Heart failure--shortness of breath, swelling of the ankles, feet, or hands, sudden weight gain, unusual weakness or fatigue Heart rhythm changes--fast or irregular heartbeat, dizziness, feeling faint or lightheaded, chest pain, trouble breathing High ammonia level--unusual weakness or fatigue, confusion, loss of appetite, nausea, vomiting, seizures Infection--fever, chills, cough, sore throat, wounds that don't heal, pain or trouble when passing urine, general feeling of discomfort or being unwell Low red blood cell level--unusual weakness or fatigue, dizziness, headache, trouble breathing Pain, tingling, or numbness in the hands or feet, muscle weakness, change in vision, confusion or trouble speaking, loss of balance or coordination, trouble walking, seizures Redness, swelling, and blistering of the skin over hands and  feet Severe or prolonged diarrhea Unusual bruising or bleeding Side effects that usually do not require medical attention (report to your care team if they continue or are bothersome): Dry skin Headache Increased tears Nausea Pain, redness, or swelling with sores inside the mouth or throat Sensitivity to light Vomiting This list may not describe all possible side effects. Call your doctor for medical advice about side effects. You may report side effects to FDA at 1-800-FDA-1088. Where should I keep my medication? This medication is given in a hospital or clinic. It will not be stored at home. NOTE: This sheet is a summary. It may not cover all possible information. If you have questions about this medicine, talk to your doctor, pharmacist, or health care provider.  2024 Elsevier/Gold Standard (2021-09-07 00:00:00)

## 2022-11-29 NOTE — Progress Notes (Signed)
Patient seen by Lonna Cobb NP today  Vitals are within treatment parameters.  Labs reviewed by Lonna Cobb NP and are not all within treatment parameters. PLTZ 94  Per physician team, patient is ready for treatment. Please note that modifications are being made to the treatment plan including oxaliplatin dose reduce. Per Misty Stanley please do not release any chemo until provider change the dose.

## 2022-12-01 ENCOUNTER — Inpatient Hospital Stay: Payer: Medicaid Other

## 2022-12-01 DIAGNOSIS — Z5111 Encounter for antineoplastic chemotherapy: Secondary | ICD-10-CM | POA: Diagnosis not present

## 2022-12-01 DIAGNOSIS — C187 Malignant neoplasm of sigmoid colon: Secondary | ICD-10-CM

## 2022-12-01 MED ORDER — HEPARIN SOD (PORK) LOCK FLUSH 100 UNIT/ML IV SOLN
500.0000 [IU] | Freq: Once | INTRAVENOUS | Status: AC | PRN
  Administered 2022-12-01: 500 [IU]

## 2022-12-01 MED ORDER — SODIUM CHLORIDE 0.9% FLUSH
10.0000 mL | INTRAVENOUS | Status: DC | PRN
  Administered 2022-12-01: 10 mL

## 2022-12-06 ENCOUNTER — Ambulatory Visit: Payer: Medicaid Other | Admitting: Occupational Therapy

## 2022-12-09 ENCOUNTER — Other Ambulatory Visit: Payer: Self-pay | Admitting: Oncology

## 2022-12-09 DIAGNOSIS — C189 Malignant neoplasm of colon, unspecified: Secondary | ICD-10-CM

## 2022-12-13 ENCOUNTER — Encounter: Payer: Medicaid Other | Admitting: Occupational Therapy

## 2022-12-24 ENCOUNTER — Other Ambulatory Visit: Payer: Self-pay | Admitting: Oncology

## 2022-12-24 DIAGNOSIS — C187 Malignant neoplasm of sigmoid colon: Secondary | ICD-10-CM

## 2022-12-27 ENCOUNTER — Inpatient Hospital Stay: Payer: Medicaid Other | Attending: Oncology

## 2022-12-27 ENCOUNTER — Inpatient Hospital Stay: Payer: Medicaid Other

## 2022-12-27 ENCOUNTER — Inpatient Hospital Stay: Payer: Medicaid Other | Admitting: Nurse Practitioner

## 2022-12-27 ENCOUNTER — Encounter: Payer: Self-pay | Admitting: Nurse Practitioner

## 2022-12-27 VITALS — BP 139/74 | HR 97 | Temp 98.1°F | Resp 18 | Ht 70.0 in | Wt 207.8 lb

## 2022-12-27 VITALS — BP 149/96 | HR 82 | Resp 18

## 2022-12-27 DIAGNOSIS — C787 Secondary malignant neoplasm of liver and intrahepatic bile duct: Secondary | ICD-10-CM

## 2022-12-27 DIAGNOSIS — C187 Malignant neoplasm of sigmoid colon: Secondary | ICD-10-CM

## 2022-12-27 DIAGNOSIS — C189 Malignant neoplasm of colon, unspecified: Secondary | ICD-10-CM | POA: Diagnosis not present

## 2022-12-27 DIAGNOSIS — Z5111 Encounter for antineoplastic chemotherapy: Secondary | ICD-10-CM | POA: Insufficient documentation

## 2022-12-27 DIAGNOSIS — H6691 Otitis media, unspecified, right ear: Secondary | ICD-10-CM | POA: Diagnosis not present

## 2022-12-27 LAB — CMP (CANCER CENTER ONLY)
ALT: 17 U/L (ref 0–44)
AST: 27 U/L (ref 15–41)
Albumin: 3.7 g/dL (ref 3.5–5.0)
Alkaline Phosphatase: 98 U/L (ref 38–126)
Anion gap: 9 (ref 5–15)
BUN: 6 mg/dL (ref 6–20)
CO2: 25 mmol/L (ref 22–32)
Calcium: 9 mg/dL (ref 8.9–10.3)
Chloride: 103 mmol/L (ref 98–111)
Creatinine: 1.03 mg/dL (ref 0.61–1.24)
GFR, Estimated: >60 mL/min (ref >60)
Glucose, Bld: 246 mg/dL — ABNORMAL HIGH (ref 70–99)
Potassium: 3.6 mmol/L (ref 3.5–5.1)
Sodium: 137 mmol/L (ref 135–145)
Total Bilirubin: 0.4 mg/dL (ref 0.3–1.2)
Total Protein: 8 g/dL (ref 6.5–8.1)

## 2022-12-27 LAB — CBC WITH DIFFERENTIAL (CANCER CENTER ONLY)
Abs Immature Granulocytes: 0.05 10*3/uL (ref 0.00–0.07)
Basophils Absolute: 0 10*3/uL (ref 0.0–0.1)
Basophils Relative: 0 %
Eosinophils Absolute: 0.1 10*3/uL (ref 0.0–0.5)
Eosinophils Relative: 1 %
HCT: 32.2 % — ABNORMAL LOW (ref 39.0–52.0)
Hemoglobin: 10.2 g/dL — ABNORMAL LOW (ref 13.0–17.0)
Immature Granulocytes: 1 %
Lymphocytes Relative: 23 %
Lymphs Abs: 2.1 10*3/uL (ref 0.7–4.0)
MCH: 28.3 pg (ref 26.0–34.0)
MCHC: 31.7 g/dL (ref 30.0–36.0)
MCV: 89.2 fL (ref 80.0–100.0)
Monocytes Absolute: 0.7 10*3/uL (ref 0.1–1.0)
Monocytes Relative: 7 %
Neutro Abs: 6.4 10*3/uL (ref 1.7–7.7)
Neutrophils Relative %: 68 %
Platelet Count: 153 10*3/uL (ref 150–400)
RBC: 3.61 MIL/uL — ABNORMAL LOW (ref 4.22–5.81)
RDW: 15.8 % — ABNORMAL HIGH (ref 11.5–15.5)
WBC Count: 9.4 10*3/uL (ref 4.0–10.5)
nRBC: 0 % (ref 0.0–0.2)

## 2022-12-27 LAB — CEA (ACCESS): CEA (CHCC): 106.38 ng/mL — ABNORMAL HIGH (ref 0.00–5.00)

## 2022-12-27 LAB — TOTAL PROTEIN, URINE DIPSTICK

## 2022-12-27 MED ORDER — FLUOROURACIL CHEMO INJECTION 2.5 GM/50ML
400.0000 mg/m2 | Freq: Once | INTRAVENOUS | Status: AC
  Administered 2022-12-27: 850 mg via INTRAVENOUS
  Filled 2022-12-27: qty 17

## 2022-12-27 MED ORDER — LEUCOVORIN CALCIUM INJECTION 350 MG
400.0000 mg/m2 | Freq: Once | INTRAVENOUS | Status: AC
  Administered 2022-12-27: 872 mg via INTRAVENOUS
  Filled 2022-12-27: qty 43.6

## 2022-12-27 MED ORDER — SODIUM CHLORIDE 0.9 % IV SOLN
2000.0000 mg/m2 | INTRAVENOUS | Status: DC
  Administered 2022-12-27: 4350 mg via INTRAVENOUS
  Filled 2022-12-27: qty 87

## 2022-12-27 MED ORDER — FAMOTIDINE IN NACL 20-0.9 MG/50ML-% IV SOLN
20.0000 mg | Freq: Once | INTRAVENOUS | Status: AC
  Administered 2022-12-27: 20 mg via INTRAVENOUS
  Filled 2022-12-27: qty 50

## 2022-12-27 MED ORDER — DIPHENHYDRAMINE HCL 50 MG/ML IJ SOLN
25.0000 mg | Freq: Once | INTRAMUSCULAR | Status: AC
  Administered 2022-12-27: 25 mg via INTRAVENOUS
  Filled 2022-12-27: qty 1

## 2022-12-27 MED ORDER — DEXTROSE 5 % IV SOLN: Freq: Once | INTRAVENOUS | Status: AC

## 2022-12-27 MED ORDER — SODIUM CHLORIDE 0.9 % IV SOLN
10.0000 mg | Freq: Once | INTRAVENOUS | Status: AC
  Administered 2022-12-27: 10 mg via INTRAVENOUS
  Filled 2022-12-27: qty 10

## 2022-12-27 MED ORDER — LORAZEPAM 0.5 MG PO TABS: 0.5000 mg | ORAL_TABLET | Freq: Three times a day (TID) | ORAL | 0 refills | Status: DC | PRN

## 2022-12-27 MED ORDER — BENZONATATE 100 MG PO CAPS: 100.0000 mg | ORAL_CAPSULE | Freq: Two times a day (BID) | ORAL | 0 refills | Status: DC | PRN

## 2022-12-27 MED ORDER — OXALIPLATIN CHEMO INJECTION 100 MG/20ML
50.0000 mg/m2 | Freq: Once | INTRAVENOUS | Status: AC
  Administered 2022-12-27: 100 mg via INTRAVENOUS
  Filled 2022-12-27: qty 20

## 2022-12-27 MED ORDER — PALONOSETRON HCL INJECTION 0.25 MG/5ML
0.2500 mg | Freq: Once | INTRAVENOUS | Status: AC
  Administered 2022-12-27: 0.25 mg via INTRAVENOUS
  Filled 2022-12-27: qty 5

## 2022-12-27 MED ORDER — PROMETHAZINE HCL 12.5 MG PO TABS
12.5000 mg | ORAL_TABLET | Freq: Four times a day (QID) | ORAL | 2 refills | Status: DC | PRN
Start: 2022-12-27 — End: 2023-03-17

## 2022-12-27 NOTE — Progress Notes (Signed)
[facility] OFFICE PROGRESS NOTE   Diagnosis: Colon cancer  INTERVAL HISTORY:   Jeff [name] returns as scheduled.  He completed cycle 2 FOLFOX 11/29/2022.  Oxaliplatin dose was reduced due to thrombocytopenia.  He denies nausea/vomiting.  No mouth sores.  No diarrhea.  No significant issues with cold sensitivity.  Periodic cough.  He denies bleeding.  Objective:  Vital signs in last 24 hours:  Blood pressure 139/74, pulse 97, temperature 98.1 F (36.7 C), temperature source Oral, resp. rate 18, height 5\' 10"  (1.778 m), weight 207 lb 12.8 oz (94.3 kg), SpO2 100%.    HEENT: No thrush or ulcers. Resp: Lungs clear bilaterally. Cardio: Regular rate and rhythm. GI: No hepatosplenomegaly. Vascular: No leg edema. Skin: Palms without erythema. Port-A-Cath without erythema.  Lab Results:  Lab Results  Component Value Date   WBC 9.4 12/27/2022   HGB 10.2 (L) 12/27/2022   HCT 32.2 (L) 12/27/2022   MCV 89.2 12/27/2022   PLT 153 12/27/2022   NEUTROABS 6.4 12/27/2022    Imaging:  No results found.  Medications: I have reviewed the patient's current medications.  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, biopsy confirmed adenocarcinoma         CTs chest, abdomen, and pelvis on 12/11/2015-indeterminate tiny pulmonary nodules, multiple liver metastases, small nodes in the sigmoid mesocolon Laparoscopic sigmoid colectomy, liver biopsy, Port-A-Cath placement 01/14/2016 Pathology sigmoid colon resection- colonic adenocarcinoma, 5.3 cm extending into pericolonic connective tissue, positive lymph-vascular invasion, positive perineural invasion, negative margins, metastatic carcinoma in 9 of 28 lymph nodes Pathology liver biopsy-metastatic colorectal adenocarcinoma MSI stable; mismatch repair protein normal APC alteration, K RAS wild-type, no BRAF mutation Cycle 1 FOLFOX 02/02/2016 Cycle 2 FOLFOX  02/15/2016 Cycle 3 FOLFOX 02/29/2016 Cycle 4 FOLFOX 03/14/2016 Cycle 5 FOLFOX 03/28/2016 Cycle 6 FOLFOX 04/11/2016 (oxaliplatin held secondary to thrombocytopenia) 04/21/2016 restaging CTs-difficulty evaluating liver lesions due to hepatic steatosis. Stable right adrenal nodule. No adenopathy or local recurrence near the rectosigmoid anastomotic site. Cycle 7 FOLFOX 04/25/2016 MRI liver 05/02/2016-partial improvement in hepatic metastases Cycle 8 FOLFOX 05/10/2016 Cycle 9 FOLFOX 05/23/2016 (oxaliplatin held due to thrombocytopenia) Cycle 10 FOLFOX 06/06/2016 (oxaliplatin dose reduced due to thrombocytopenia) Cycle 11 FOLFOX 06/27/2016 (oxaliplatin held due to neuropathy) Cycle 12 FOLFOX 07/11/2016 (oxaliplatin held) Initiation of maintenance Xeloda 7 days on/7 days off 07/27/2016 MRI liver 11/18/2016-decrease in hepatic metastatic disease. No new or progressive disease identified within the abdomen. Continuation of Xeloda 7 days on/7 days off MRI liver 04/27/2017-previous liver lesions not identified, no new lesions, no lymphadenopathy Xeloda continued 7 days on/7 days off MRI liver 12/04/2017 - no evidence of metastatic disease, hepatic steatosis Xeloda continued 7 days on/7 days off MRI liver 07/15/2018- no evidence of metastatic disease.  Stable severe hepatic steatosis. Xeloda continued 7 days on/7 days off MRI liver 03/16/2019-hepatic steatosis, no liver mass, focal area of intrahepatic biliary dilatation in segments 2 and 3 of the left lobe-increased Xeloda continued 7 days on/7 days off MRI abdomen 08/19/2019-no findings to suggest liver metastases.  Bilateral lung nodules measuring up to 2.8 cm, progressive and more conspicuous than on previous exam CT chest 08/29/2019-multiple pulmonary metastases, new from 04/21/2016 Cycle 1 FOLFIRI/bevacizumab 09/09/2019 Cycle 2 FOLFIRI/bevacizumab 09/26/2019  Cycle 3 FOLFIRI/bevacizumab 10/10/2019 Cycle 4 FOLFIRI/bevacizumab 10/24/2019 Cycle 5  FOLFIRI/bevacizumab 11/07/2019 CT chest 11/14/2019-decreased size of lung nodules, no new lesions, hepatic steatosis Cycle 6 FOLFIRI/bevacizumab 11/21/2019 Cycle 7 FOLFIRI/bevacizumab 12/05/2019 Cycle 8 FOLFIRI/bevacizumab 12/19/2019 Cycle 9 FOLFIRI/bevacizumab 01/02/2020 Cycle 10  FOLFIRI/bevacizumab 01/16/2020 CT chest 01/29/2020-stable bilateral pulmonary metastases.  No new or progressive metastatic disease in the chest. Cycle 11 FOLFIRI/bevacizumab 01/30/2020 Cycle 12 FOLFIRI/bevacizumab 02/19/2020 Cycle 13 FOLFIRI/bevacizumab 03/12/2020 Cycle 14 FOLFIRI/bevacizumab 04/02/2020 Cycle 15 FOLFIRI/bevacizumab 04/23/2020 Cycle 16 FOLFIRI/bevacizumab 05/21/2020 CT chest 06/09/2020-mild progression pulmonary metastasis.  Some lesions have increased in size while others are similar. Cycle 1 irinotecan/Panitumumab 06/18/2020 Cycle 2 irinotecan/Panitumumab 07/02/2020 Cycle 3 irinotecan/panitumumab 07/16/2020 Cycle 4 irinotecan/Panitumumab 07/30/2020 Cycle 5 irinotecan/Panitumumab 08/13/2020, Emend added Cycle 6 irinotecan/Panitumumab 08/27/2020 CT chest 09/08/2020-decreased size of pulmonary nodules, no progressive disease Cycle 7 irinotecan/panitumumab 09/02/2020 Cycle 8 irinotecan/panitumumab 09/24/2020 Cycle 9 irinotecan/Panitumumab 10/08/2020 10/22/2020 treatment held due to left foot fracture, need for surgery Cycle 10 irinotecan/panitumumab 11/24/2020 Cycle 11 irinotecan/Panitumumab 12/08/2020 Cycle 12 irinotecan/panitumumab 12/22/2020 Cycle 13 irinotecan/panitumumab 01/05/2021 01/20/2021 CT chest-mixed response with minimal increase in size of some lesions and minimal decrease in the size of other lesions.  Overall number of lesions is unchanged. Cycle 14 irinotecan/Panitumumab 02/05/2021 Cycle 15 irinotecan/Panitumumab 02/23/2021 Cycle 16 irinotecan/panitumumab 03/16/2021 Cycle 17 irinotecan/Panitumumab 04/13/2021 05/03/2021-CT chest-enlargement of pulmonary metastases, no new lesions 06/21/2021-cycle 1  Lonsurf/bevacizumab 07/19/2021-cycle 2 Lonsurf/bevacizumab 08/16/2021-cycle 3 Lonsurf/bevacizumab 09/09/2021 CT chest-bilateral lung nodules and masses with mixed response, overall stable to very minimally increased 09/13/2021 cycle 4 Lonsurf/bevacizumab 10/12/2021 cycle 5 Lonsurf/bevacizumab 10/21/2021 chest CT-bilateral pulmonary metastases without significant change.  No new or progressive disease within the chest. 11/08/2021 cycle 6 Lonsurf/bevacizumab 12/06/2021 cycle 7 Lonsurf/bevacizumab 01/03/2022 cycle 8 Lonsurf/bevacizumab CTs 01/25/2022-index lung lesion stable to mildly increased in size.  Mild retroperitoneal adenopathy, mildly increased in size compared to 12/16/2020. 02/01/2022 cycle 9 Lonsurf/bevacizumab 02/28/2022 cycle 10 Lonsurf/bevacizumab 03/28/2022 cycle 11 Lonsurf/bevacizumab CTs 04/21/2022-no change in bilateral pulmonary metastases, 2 new hypoattenuating liver lesions, new enlarged portacaval node Cycle 1 fruquintinib 05/24/2022 Cycle 2 fruquintinib 06/21/2022 Cycle 3 fruquintinib 07/19/2022 CTs 08/12/2022-stable lung nodules, hilar and retroperitoneal nodes, slight increase in size of liver metastases, stable right adrenal nodule Cycle 4 fruquintinib 08/16/2022 Cycle 5 fruquintinib 09/13/2022 Cycle 6 fruquintinib 10/11/2022 CTs 10/22/2022-progressive mediastinal, pericardial, and periportal adenopathy, enlargement of liver lesions, stable pulmonary nodules Cycle 1 FOLFOX 11/14/2022 Cycle 2 FOLFOX 11/29/2022, oxaliplatin dose reduced due to thrombocytopenia Cycle 3 FOLFOX 12/27/2022   2.   Rectal bleeding and constipation secondary to #1   3.   History of peripheral vascular disease, status post left lower extremity vascular bypass surgery in April 2017   4.   History of nephrolithiasis   5.   History of Graves' disease treated with radioactive iodine   6.   Anxiety/depression   7.   Hypertension   8.   Hospitalization 01/19/2016 with wound dehiscence status post secondary suture closure  of abdominal wall   9.   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 Hyperglycemia 06/20/2016-diagnosed with diabetes, maintained on insulin   11.  Positive COVID test 12/13/2018; positive COVID test 01/25/2021    Disposition: Mr. Sotto appears stable.  He has completed 2 cycles of FOLFOX.  He continues to tolerate well.  Plan to proceed with cycle 3 today as scheduled.  We will follow-up on the CEA from today.  CBC reviewed.  Counts adequate to proceed as above.  He will return for follow-up and treatment in 2 weeks.  We are available to see him sooner if needed.    Lonna Cobb ANP/GNP-BC   12/27/2022  9:36 AM

## 2022-12-27 NOTE — Progress Notes (Signed)
Patient seen by Lisa Thomas NP today  Vitals are within treatment parameters.  Labs reviewed by Lisa Thomas NP and are within treatment parameters.  Per physician team, patient is ready for treatment and there are NO modifications to the treatment plan.     

## 2022-12-27 NOTE — Patient Instructions (Signed)
[place] [facility]   Discharge Instructions: Thank you for choosing [facility] to provide your oncology and hematology care.   If you have a lab appointment with the Cancer Center, please go directly to the Cancer Center and check in at the registration area.   Wear comfortable clothing and clothing appropriate for easy access to any Portacath or PICC line.   We strive to give you quality time with your provider. You may need to reschedule your appointment if you arrive late (15 or more minutes).  Arriving late affects you and other patients whose appointments are after yours.  Also, if you miss three or more appointments without notifying the office, you may be dismissed from the clinic at the provider's discretion.      For prescription refill requests, have your pharmacy contact our office and allow 72 hours for refills to be completed.    Today you received the following chemotherapy and/or immunotherapy agents Oxaliplatin (ELOXATIN), Leucovorin & Flourouracil (ADRUCIL).      To help prevent nausea and vomiting after your treatment, we encourage you to take your nausea medication as directed.  BELOW ARE SYMPTOMS THAT SHOULD BE REPORTED IMMEDIATELY: *FEVER GREATER THAN 100.4 F (38 C) OR HIGHER *CHILLS OR SWEATING *NAUSEA AND VOMITING THAT IS NOT CONTROLLED WITH YOUR NAUSEA MEDICATION *UNUSUAL SHORTNESS OF BREATH *UNUSUAL BRUISING OR BLEEDING *URINARY PROBLEMS (pain or burning when urinating, or frequent urination) *BOWEL PROBLEMS (unusual diarrhea, constipation, pain near the anus) TENDERNESS IN MOUTH AND THROAT WITH OR WITHOUT PRESENCE OF ULCERS (sore throat, sores in mouth, or a toothache) UNUSUAL RASH, SWELLING OR PAIN  UNUSUAL VAGINAL DISCHARGE OR ITCHING   Items with * indicate a potential emergency and should be followed up as soon as possible or go to the Emergency Department if any problems should occur.  Please show the  CHEMOTHERAPY ALERT CARD or IMMUNOTHERAPY ALERT CARD at check-in to the Emergency Department and triage nurse.  Should you have questions after your visit or need to cancel or reschedule your appointment, please contact [place] [facility]  Dept: [phone]  and follow the prompts.  Office hours are 8:00 a.m. to 4:30 p.m. Monday - Friday. Please note that voicemails left after 4:00 p.m. may not be returned until the following business day.  We are closed weekends and major holidays. You have access to a nurse at all times for urgent questions. Please call the main number to the clinic Dept: [phone] and follow the prompts.   For any non-urgent questions, you may also contact your provider using MyChart. We now offer e-Visits for anyone 61 and older to request care online for non-urgent symptoms. For details visit [email or web address].   Also download the MyChart app! Go to the app store, search "MyChart", open the app, select [place], and log in with your MyChart username and password.  Oxaliplatin Injection What is this medication? OXALIPLATIN (ox AL i PLA tin) treats colorectal cancer. It works by slowing down the growth of cancer cells. This medicine may be used for other purposes; ask your health care provider or pharmacist if you have questions. COMMON BRAND NAME(S): Eloxatin What should I tell my care team before I take this medication? They need to know if you have any of these conditions: Heart disease History of irregular heartbeat or rhythm Liver disease Low blood cell levels (white cells, red cells, and platelets) Lung or breathing disease, such as asthma Take medications that treat  or prevent blood clots Tingling of the fingers, toes, or other nerve disorder An unusual or allergic reaction to oxaliplatin, other medications, foods, dyes, or preservatives If you or your partner are pregnant or trying to get pregnant Breast-feeding How should  I use this medication? This medication is injected into a vein. It is given by your care team in a hospital or clinic setting. Talk to your care team about the use of this medication in children. Special care may be needed. Overdosage: If you think you have taken too much of this medicine contact a poison control center or emergency room at once. NOTE: This medicine is only for you. Do not share this medicine with others. What if I miss a dose? Keep appointments for follow-up doses. It is important not to miss a dose. Call your care team if you are unable to keep an appointment. What may interact with this medication? Do not take this medication with any of the following: Cisapride Dronedarone Pimozide Thioridazine This medication may also interact with the following: Aspirin and aspirin-like medications Certain medications that treat or prevent blood clots, such as warfarin, apixaban, dabigatran, and rivaroxaban Cisplatin Cyclosporine Diuretics Medications for infection, such as acyclovir, adefovir, amphotericin B, bacitracin, cidofovir, foscarnet, ganciclovir, gentamicin, pentamidine, vancomycin NSAIDs, medications for pain and inflammation, such as ibuprofen or naproxen Other medications that cause heart rhythm changes Pamidronate Zoledronic acid This list may not describe all possible interactions. Give your health care provider a list of all the medicines, herbs, non-prescription drugs, or dietary supplements you use. Also tell them if you smoke, drink alcohol, or use illegal drugs. Some items may interact with your medicine. What should I watch for while using this medication? Your condition will be monitored carefully while you are receiving this medication. You may need blood work while taking this medication. This medication may make you feel generally unwell. This is not uncommon as chemotherapy can affect healthy cells as well as cancer cells. Report any side effects. Continue  your course of treatment even though you feel ill unless your care team tells you to stop. This medication may increase your risk of getting an infection. Call your care team for advice if you get a fever, chills, sore throat, or other symptoms of a cold or flu. Do not treat yourself. Try to avoid being around people who are sick. Avoid taking medications that contain aspirin, acetaminophen, ibuprofen, naproxen, or ketoprofen unless instructed by your care team. These medications may hide a fever. Be careful brushing or flossing your teeth or using a toothpick because you may get an infection or bleed more easily. If you have any dental work done, tell your dentist you are receiving this medication. This medication can make you more sensitive to cold. Do not drink cold drinks or use ice. Cover exposed skin before coming in contact with cold temperatures or cold objects. When out in cold weather wear warm clothing and cover your mouth and nose to warm the air that goes into your lungs. Tell your care team if you get sensitive to the cold. Talk to your care team if you or your partner are pregnant or think either of you might be pregnant. This medication can cause serious birth defects if taken during pregnancy and for 9 months after the last dose. A negative pregnancy test is required before starting this medication. A reliable form of contraception is recommended while taking this medication and for 9 months after the last dose. Talk to your care  team about effective forms of contraception. Do not father a child while taking this medication and for 6 months after the last dose. Use a condom while having sex during this time period. Do not breastfeed while taking this medication and for 3 months after the last dose. This medication may cause infertility. Talk to your care team if you are concerned about your fertility. What side effects may I notice from receiving this medication? Side effects that you  should report to your care team as soon as possible: Allergic reactions--skin rash, itching, hives, swelling of the face, lips, tongue, or throat Bleeding--bloody or black, tar-like stools, vomiting blood or brown material that looks like coffee grounds, red or dark brown urine, small red or purple spots on skin, unusual bruising or bleeding Dry cough, shortness of breath or trouble breathing Heart rhythm changes--fast or irregular heartbeat, dizziness, feeling faint or lightheaded, chest pain, trouble breathing Infection--fever, chills, cough, sore throat, wounds that don't heal, pain or trouble when passing urine, general feeling of discomfort or being unwell Liver injury--right upper belly pain, loss of appetite, nausea, light-colored stool, dark yellow or brown urine, yellowing skin or eyes, unusual weakness or fatigue Low red blood cell level--unusual weakness or fatigue, dizziness, headache, trouble breathing Muscle injury--unusual weakness or fatigue, muscle pain, dark yellow or brown urine, decrease in amount of urine Pain, tingling, or numbness in the hands or feet Sudden and severe headache, confusion, change in vision, seizures, which may be signs of posterior reversible encephalopathy syndrome (PRES) Unusual bruising or bleeding Side effects that usually do not require medical attention (report to your care team if they continue or are bothersome): Diarrhea Nausea Pain, redness, or swelling with sores inside the mouth or throat Unusual weakness or fatigue Vomiting This list may not describe all possible side effects. Call your doctor for medical advice about side effects. You may report side effects to FDA at 1-800-FDA-1088. Where should I keep my medication? This medication is given in a hospital or clinic. It will not be stored at home. NOTE: This sheet is a summary. It may not cover all possible information. If you have questions about this medicine, talk to your doctor,  pharmacist, or health care provider.  2024 Elsevier/Gold Standard (2022-02-15 00:00:00)  Leucovorin Injection What is this medication? LEUCOVORIN (loo koe VOR in) prevents side effects from certain medications, such as methotrexate. It works by increasing folate levels. This helps protect healthy cells in your body. It may also be used to treat anemia caused by low levels of folate. It can also be used with fluorouracil, a type of chemotherapy, to treat colorectal cancer. It works by increasing the effects of fluorouracil in the body. This medicine may be used for other purposes; ask your health care provider or pharmacist if you have questions. What should I tell my care team before I take this medication? They need to know if you have any of these conditions: Anemia from low levels of vitamin B12 in the blood An unusual or allergic reaction to leucovorin, folic acid, other medications, foods, dyes, or preservatives Pregnant or trying to get pregnant Breastfeeding How should I use this medication? This medication is injected into a vein or a muscle. It is given by your care team in a hospital or clinic setting. Talk to your care team about the use of this medication in children. Special care may be needed. Overdosage: If you think you have taken too much of this medicine contact a poison control center  or emergency room at once. NOTE: This medicine is only for you. Do not share this medicine with others. What if I miss a dose? Keep appointments for follow-up doses. It is important not to miss your dose. Call your care team if you are unable to keep an appointment. What may interact with this medication? Capecitabine Fluorouracil Phenobarbital Phenytoin Primidone Trimethoprim;sulfamethoxazole This list may not describe all possible interactions. Give your health care provider a list of all the medicines, herbs, non-prescription drugs, or dietary supplements you use. Also tell them if you  smoke, drink alcohol, or use illegal drugs. Some items may interact with your medicine. What should I watch for while using this medication? Your condition will be monitored carefully while you are receiving this medication. This medication may increase the side effects of 5-fluorouracil. Tell your care team if you have diarrhea or mouth sores that do not get better or that get worse. What side effects may I notice from receiving this medication? Side effects that you should report to your care team as soon as possible: Allergic reactions--skin rash, itching, hives, swelling of the face, lips, tongue, or throat This list may not describe all possible side effects. Call your doctor for medical advice about side effects. You may report side effects to FDA at 1-800-FDA-1088. Where should I keep my medication? This medication is given in a hospital or clinic. It will not be stored at home. NOTE: This sheet is a summary. It may not cover all possible information. If you have questions about this medicine, talk to your doctor, pharmacist, or health care provider.  2024 Elsevier/Gold Standard (2021-10-05 00:00:00)  Fluorouracil Injection What is this medication? FLUOROURACIL (flure oh YOOR a sil) treats some types of cancer. It works by slowing down the growth of cancer cells. This medicine may be used for other purposes; ask your health care provider or pharmacist if you have questions. COMMON BRAND NAME(S): Adrucil What should I tell my care team before I take this medication? They need to know if you have any of these conditions: Blood disorders Dihydropyrimidine dehydrogenase (DPD) deficiency Infection, such as chickenpox, cold sores, herpes Kidney disease Liver disease Poor nutrition Recent or ongoing radiation therapy An unusual or allergic reaction to fluorouracil, other medications, foods, dyes, or preservatives If you or your partner are pregnant or trying to get  pregnant Breast-feeding How should I use this medication? This medication is injected into a vein. It is administered by your care team in a hospital or clinic setting. Talk to your care team about the use of this medication in children. Special care may be needed. Overdosage: If you think you have taken too much of this medicine contact a poison control center or emergency room at once. NOTE: This medicine is only for you. Do not share this medicine with others. What if I miss a dose? Keep appointments for follow-up doses. It is important not to miss your dose. Call your care team if you are unable to keep an appointment. What may interact with this medication? Do not take this medication with any of the following: Live virus vaccines This medication may also interact with the following: Medications that treat or prevent blood clots, such as warfarin, enoxaparin, dalteparin This list may not describe all possible interactions. Give your health care provider a list of all the medicines, herbs, non-prescription drugs, or dietary supplements you use. Also tell them if you smoke, drink alcohol, or use illegal drugs. Some items may interact with your  medicine. What should I watch for while using this medication? Your condition will be monitored carefully while you are receiving this medication. This medication may make you feel generally unwell. This is not uncommon as chemotherapy can affect healthy cells as well as cancer cells. Report any side effects. Continue your course of treatment even though you feel ill unless your care team tells you to stop. In some cases, you may be given additional medications to help with side effects. Follow all directions for their use. This medication may increase your risk of getting an infection. Call your care team for advice if you get a fever, chills, sore throat, or other symptoms of a cold or flu. Do not treat yourself. Try to avoid being around people who are  sick. This medication may increase your risk to bruise or bleed. Call your care team if you notice any unusual bleeding. Be careful brushing or flossing your teeth or using a toothpick because you may get an infection or bleed more easily. If you have any dental work done, tell your dentist you are receiving this medication. Avoid taking medications that contain aspirin, acetaminophen, ibuprofen, naproxen, or ketoprofen unless instructed by your care team. These medications may hide a fever. Do not treat diarrhea with over the counter products. Contact your care team if you have diarrhea that lasts more than 2 days or if it is severe and watery. This medication can make you more sensitive to the sun. Keep out of the sun. If you cannot avoid being in the sun, wear protective clothing and sunscreen. Do not use sun lamps, tanning beds, or tanning booths. Talk to your care team if you or your partner wish to become pregnant or think you might be pregnant. This medication can cause serious birth defects if taken during pregnancy and for 3 months after the last dose. A reliable form of contraception is recommended while taking this medication and for 3 months after the last dose. Talk to your care team about effective forms of contraception. Do not father a child while taking this medication and for 3 months after the last dose. Use a condom while having sex during this time period. Do not breastfeed while taking this medication. This medication may cause infertility. Talk to your care team if you are concerned about your fertility. What side effects may I notice from receiving this medication? Side effects that you should report to your care team as soon as possible: Allergic reactions--skin rash, itching, hives, swelling of the face, lips, tongue, or throat Heart attack--pain or tightness in the chest, shoulders, arms, or jaw, nausea, shortness of breath, cold or clammy skin, feeling faint or  lightheaded Heart failure--shortness of breath, swelling of the ankles, feet, or hands, sudden weight gain, unusual weakness or fatigue Heart rhythm changes--fast or irregular heartbeat, dizziness, feeling faint or lightheaded, chest pain, trouble breathing High ammonia level--unusual weakness or fatigue, confusion, loss of appetite, nausea, vomiting, seizures Infection--fever, chills, cough, sore throat, wounds that don't heal, pain or trouble when passing urine, general feeling of discomfort or being unwell Low red blood cell level--unusual weakness or fatigue, dizziness, headache, trouble breathing Pain, tingling, or numbness in the hands or feet, muscle weakness, change in vision, confusion or trouble speaking, loss of balance or coordination, trouble walking, seizures Redness, swelling, and blistering of the skin over hands and feet Severe or prolonged diarrhea Unusual bruising or bleeding Side effects that usually do not require medical attention (report to your care team if  they continue or are bothersome): Dry skin Headache Increased tears Nausea Pain, redness, or swelling with sores inside the mouth or throat Sensitivity to light Vomiting This list may not describe all possible side effects. Call your doctor for medical advice about side effects. You may report side effects to FDA at 1-800-FDA-1088. Where should I keep my medication? This medication is given in a hospital or clinic. It will not be stored at home. NOTE: This sheet is a summary. It may not cover all possible information. If you have questions about this medicine, talk to your doctor, pharmacist, or health care provider.  2024 Elsevier/Gold Standard (2021-09-07 00:00:00)  The chemotherapy medication bag should finish at 46 hours, 96 hours, or 7 days. For example, if your pump is scheduled for 46 hours and it was put on at 4:00 p.m., it should finish at 2:00 p.m. the day it is scheduled to come off regardless of your  appointment time.     Estimated time to finish at 12:00 p.m. on Thursday 12/29/2022.   If the display on your pump reads "Low Volume" and it is beeping, take the batteries out of the pump and come to the cancer center for it to be taken off.   If the pump alarms go off prior to the pump reading "Low Volume" then call [phone] and someone can assist you.  If the plunger comes out and the chemotherapy medication is leaking out, please use your home chemo spill kit to clean up the spill. Do NOT use paper towels or other household products.  If you have problems or questions regarding your pump, please call either [phone] (24 hours a day) or the cancer center Monday-Friday 8:00 a.m.- 4:30 p.m. at the clinic number and we will assist you. If you are unable to get assistance, then go to the nearest Emergency Department and ask the staff to contact the IV team for assistance.

## 2022-12-29 ENCOUNTER — Inpatient Hospital Stay: Payer: Medicaid Other

## 2022-12-29 VITALS — BP 132/91 | HR 92 | Temp 97.7°F | Resp 18

## 2022-12-29 DIAGNOSIS — C187 Malignant neoplasm of sigmoid colon: Secondary | ICD-10-CM

## 2022-12-29 DIAGNOSIS — Z5111 Encounter for antineoplastic chemotherapy: Secondary | ICD-10-CM | POA: Diagnosis not present

## 2022-12-29 MED ORDER — SODIUM CHLORIDE 0.9% FLUSH
10.0000 mL | INTRAVENOUS | Status: DC | PRN
  Administered 2022-12-29: 10 mL

## 2022-12-29 MED ORDER — HEPARIN SOD (PORK) LOCK FLUSH 100 UNIT/ML IV SOLN
500.0000 [IU] | Freq: Once | INTRAVENOUS | Status: AC | PRN
  Administered 2022-12-29: 500 [IU]

## 2022-12-29 NOTE — Patient Instructions (Signed)

## 2023-01-08 ENCOUNTER — Other Ambulatory Visit: Payer: Self-pay | Admitting: Oncology

## 2023-01-08 DIAGNOSIS — C187 Malignant neoplasm of sigmoid colon: Secondary | ICD-10-CM

## 2023-01-10 ENCOUNTER — Inpatient Hospital Stay: Payer: Medicaid Other

## 2023-01-10 ENCOUNTER — Encounter: Payer: Self-pay | Admitting: Nurse Practitioner

## 2023-01-10 ENCOUNTER — Inpatient Hospital Stay: Payer: Medicaid Other | Admitting: Nurse Practitioner

## 2023-01-10 VITALS — BP 144/89 | HR 100 | Temp 98.2°F | Resp 18 | Ht 70.0 in | Wt 209.0 lb

## 2023-01-10 VITALS — BP 131/76 | HR 79 | Resp 18

## 2023-01-10 DIAGNOSIS — Z5111 Encounter for antineoplastic chemotherapy: Secondary | ICD-10-CM | POA: Diagnosis not present

## 2023-01-10 DIAGNOSIS — C187 Malignant neoplasm of sigmoid colon: Secondary | ICD-10-CM

## 2023-01-10 LAB — CMP (CANCER CENTER ONLY)
ALT: 29 U/L (ref 0–44)
AST: 39 U/L (ref 15–41)
Albumin: 3.5 g/dL (ref 3.5–5.0)
Alkaline Phosphatase: 117 U/L (ref 38–126)
Anion gap: 10 (ref 5–15)
BUN: 7 mg/dL (ref 6–20)
CO2: 25 mmol/L (ref 22–32)
Calcium: 8.3 mg/dL — ABNORMAL LOW (ref 8.9–10.3)
Chloride: 100 mmol/L (ref 98–111)
Creatinine: 1.07 mg/dL (ref 0.61–1.24)
GFR, Estimated: >60 mL/min (ref >60)
Glucose, Bld: 284 mg/dL — ABNORMAL HIGH (ref 70–99)
Potassium: 3.7 mmol/L (ref 3.5–5.1)
Sodium: 135 mmol/L (ref 135–145)
Total Bilirubin: 0.4 mg/dL (ref 0.3–1.2)
Total Protein: 7.4 g/dL (ref 6.5–8.1)

## 2023-01-10 LAB — CBC WITH DIFFERENTIAL (CANCER CENTER ONLY)
Abs Immature Granulocytes: 0.02 10*3/uL (ref 0.00–0.07)
Basophils Absolute: 0 10*3/uL (ref 0.0–0.1)
Basophils Relative: 0 %
Eosinophils Absolute: 0.2 10*3/uL (ref 0.0–0.5)
Eosinophils Relative: 3 %
HCT: 30.6 % — ABNORMAL LOW (ref 39.0–52.0)
Hemoglobin: 9.6 g/dL — ABNORMAL LOW (ref 13.0–17.0)
Immature Granulocytes: 0 %
Lymphocytes Relative: 31 %
Lymphs Abs: 2.3 10*3/uL (ref 0.7–4.0)
MCH: 27.9 pg (ref 26.0–34.0)
MCHC: 31.4 g/dL (ref 30.0–36.0)
MCV: 89 fL (ref 80.0–100.0)
Monocytes Absolute: 0.6 10*3/uL (ref 0.1–1.0)
Monocytes Relative: 8 %
Neutro Abs: 4.2 10*3/uL (ref 1.7–7.7)
Neutrophils Relative %: 58 %
Platelet Count: 96 10*3/uL — ABNORMAL LOW (ref 150–400)
RBC: 3.44 MIL/uL — ABNORMAL LOW (ref 4.22–5.81)
RDW: 16 % — ABNORMAL HIGH (ref 11.5–15.5)
WBC Count: 7.3 10*3/uL (ref 4.0–10.5)
nRBC: 0 % (ref 0.0–0.2)

## 2023-01-10 LAB — CEA (ACCESS): CEA (CHCC): 105.23 ng/mL — ABNORMAL HIGH (ref 0.00–5.00)

## 2023-01-10 MED ORDER — DEXTROSE 5 % IV SOLN: Freq: Once | INTRAVENOUS | Status: AC

## 2023-01-10 MED ORDER — PALONOSETRON HCL INJECTION 0.25 MG/5ML
0.2500 mg | Freq: Once | INTRAVENOUS | Status: AC
  Administered 2023-01-10: 0.25 mg via INTRAVENOUS
  Filled 2023-01-10: qty 5

## 2023-01-10 MED ORDER — SODIUM CHLORIDE 0.9 % IV SOLN
10.0000 mg | Freq: Once | INTRAVENOUS | Status: AC
  Administered 2023-01-10: 10 mg via INTRAVENOUS
  Filled 2023-01-10: qty 10

## 2023-01-10 MED ORDER — OXALIPLATIN CHEMO INJECTION 100 MG/20ML
50.0000 mg/m2 | Freq: Once | INTRAVENOUS | Status: AC
  Administered 2023-01-10: 100 mg via INTRAVENOUS
  Filled 2023-01-10: qty 20

## 2023-01-10 MED ORDER — LEUCOVORIN CALCIUM INJECTION 350 MG
400.0000 mg/m2 | Freq: Once | INTRAVENOUS | Status: AC
  Administered 2023-01-10: 872 mg via INTRAVENOUS
  Filled 2023-01-10: qty 43.6

## 2023-01-10 MED ORDER — FLUOROURACIL CHEMO INJECTION 2.5 GM/50ML
400.0000 mg/m2 | Freq: Once | INTRAVENOUS | Status: AC
  Administered 2023-01-10: 850 mg via INTRAVENOUS
  Filled 2023-01-10: qty 17

## 2023-01-10 MED ORDER — DIPHENHYDRAMINE HCL 50 MG/ML IJ SOLN
25.0000 mg | Freq: Once | INTRAMUSCULAR | Status: AC
  Administered 2023-01-10: 25 mg via INTRAVENOUS
  Filled 2023-01-10: qty 1

## 2023-01-10 MED ORDER — SODIUM CHLORIDE 0.9 % IV SOLN
2000.0000 mg/m2 | INTRAVENOUS | Status: DC
  Administered 2023-01-10: 4350 mg via INTRAVENOUS
  Filled 2023-01-10: qty 87

## 2023-01-10 MED ORDER — FAMOTIDINE IN NACL 20-0.9 MG/50ML-% IV SOLN
20.0000 mg | Freq: Once | INTRAVENOUS | Status: AC
  Administered 2023-01-10: 20 mg via INTRAVENOUS
  Filled 2023-01-10: qty 50

## 2023-01-10 NOTE — Progress Notes (Signed)
[facility] OFFICE PROGRESS NOTE   Diagnosis: Colon cancer  INTERVAL HISTORY:   Jeff [name] returns as scheduled.  He completed cycle 3 FOLFOX 12/27/2022.  No signs of allergic reaction.  He denies nausea/vomiting.  No mouth sores.  No diarrhea.  Toes intermittently feel cold.  He has a persistent cough.  Objective:  Vital signs in last 24 hours:  Blood pressure (!) 144/89, pulse 100, temperature 98.2 F (36.8 C), temperature source Oral, resp. rate 18, height 5\' 10"  (1.778 m), weight 209 lb (94.8 kg), SpO2 100%.    HEENT: No thrush or ulcers. Resp: Lungs clear bilaterally. Cardio: Regular rate and rhythm. GI: No hepatosplenomegaly. Vascular: No leg edema. Neuro: Vibratory sense intact over the fingertips per tuning fork exam. Skin: Palms without erythema. Port-A-Cath without erythema.  Lab Results:  Lab Results  Component Value Date   WBC 7.3 01/10/2023   HGB 9.6 (L) 01/10/2023   HCT 30.6 (L) 01/10/2023   MCV 89.0 01/10/2023   PLT PENDING 01/10/2023   NEUTROABS 4.2 01/10/2023    Imaging:  No results found.  Medications: I have reviewed the patient's current medications.  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, biopsy confirmed adenocarcinoma         CTs chest, abdomen, and pelvis on 12/11/2015-indeterminate tiny pulmonary nodules, multiple liver metastases, small nodes in the sigmoid mesocolon Laparoscopic sigmoid colectomy, liver biopsy, Port-A-Cath placement 01/14/2016 Pathology sigmoid colon resection- colonic adenocarcinoma, 5.3 cm extending into pericolonic connective tissue, positive lymph-vascular invasion, positive perineural invasion, negative margins, metastatic carcinoma in 9 of 28 lymph nodes Pathology liver biopsy-metastatic colorectal adenocarcinoma MSI stable; mismatch repair protein normal APC alteration, K RAS wild-type, no BRAF mutation Cycle 1 FOLFOX  02/02/2016 Cycle 2 FOLFOX 02/15/2016 Cycle 3 FOLFOX 02/29/2016 Cycle 4 FOLFOX 03/14/2016 Cycle 5 FOLFOX 03/28/2016 Cycle 6 FOLFOX 04/11/2016 (oxaliplatin held secondary to thrombocytopenia) 04/21/2016 restaging CTs-difficulty evaluating liver lesions due to hepatic steatosis. Stable right adrenal nodule. No adenopathy or local recurrence near the rectosigmoid anastomotic site. Cycle 7 FOLFOX 04/25/2016 MRI liver 05/02/2016-partial improvement in hepatic metastases Cycle 8 FOLFOX 05/10/2016 Cycle 9 FOLFOX 05/23/2016 (oxaliplatin held due to thrombocytopenia) Cycle 10 FOLFOX 06/06/2016 (oxaliplatin dose reduced due to thrombocytopenia) Cycle 11 FOLFOX 06/27/2016 (oxaliplatin held due to neuropathy) Cycle 12 FOLFOX 07/11/2016 (oxaliplatin held) Initiation of maintenance Xeloda 7 days on/7 days off 07/27/2016 MRI liver 11/18/2016-decrease in hepatic metastatic disease. No new or progressive disease identified within the abdomen. Continuation of Xeloda 7 days on/7 days off MRI liver 04/27/2017-previous liver lesions not identified, no new lesions, no lymphadenopathy Xeloda continued 7 days on/7 days off MRI liver 12/04/2017 - no evidence of metastatic disease, hepatic steatosis Xeloda continued 7 days on/7 days off MRI liver 07/15/2018- no evidence of metastatic disease.  Stable severe hepatic steatosis. Xeloda continued 7 days on/7 days off MRI liver 03/16/2019-hepatic steatosis, no liver mass, focal area of intrahepatic biliary dilatation in segments 2 and 3 of the left lobe-increased Xeloda continued 7 days on/7 days off MRI abdomen 08/19/2019-no findings to suggest liver metastases.  Bilateral lung nodules measuring up to 2.8 cm, progressive and more conspicuous than on previous exam CT chest 08/29/2019-multiple pulmonary metastases, new from 04/21/2016 Cycle 1 FOLFIRI/bevacizumab 09/09/2019 Cycle 2 FOLFIRI/bevacizumab 09/26/2019  Cycle 3 FOLFIRI/bevacizumab 10/10/2019 Cycle 4 FOLFIRI/bevacizumab  10/24/2019 Cycle 5 FOLFIRI/bevacizumab 11/07/2019 CT chest 11/14/2019-decreased size of lung nodules, no new lesions, hepatic steatosis Cycle 6 FOLFIRI/bevacizumab 11/21/2019 Cycle 7 FOLFIRI/bevacizumab 12/05/2019 Cycle 8 FOLFIRI/bevacizumab 12/19/2019 Cycle  9 FOLFIRI/bevacizumab 01/02/2020 Cycle 10 FOLFIRI/bevacizumab 01/16/2020 CT chest 01/29/2020-stable bilateral pulmonary metastases.  No new or progressive metastatic disease in the chest. Cycle 11 FOLFIRI/bevacizumab 01/30/2020 Cycle 12 FOLFIRI/bevacizumab 02/19/2020 Cycle 13 FOLFIRI/bevacizumab 03/12/2020 Cycle 14 FOLFIRI/bevacizumab 04/02/2020 Cycle 15 FOLFIRI/bevacizumab 04/23/2020 Cycle 16 FOLFIRI/bevacizumab 05/21/2020 CT chest 06/09/2020-mild progression pulmonary metastasis.  Some lesions have increased in size while others are similar. Cycle 1 irinotecan/Panitumumab 06/18/2020 Cycle 2 irinotecan/Panitumumab 07/02/2020 Cycle 3 irinotecan/panitumumab 07/16/2020 Cycle 4 irinotecan/Panitumumab 07/30/2020 Cycle 5 irinotecan/Panitumumab 08/13/2020, Emend added Cycle 6 irinotecan/Panitumumab 08/27/2020 CT chest 09/08/2020-decreased size of pulmonary nodules, no progressive disease Cycle 7 irinotecan/panitumumab 09/02/2020 Cycle 8 irinotecan/panitumumab 09/24/2020 Cycle 9 irinotecan/Panitumumab 10/08/2020 10/22/2020 treatment held due to left foot fracture, need for surgery Cycle 10 irinotecan/panitumumab 11/24/2020 Cycle 11 irinotecan/Panitumumab 12/08/2020 Cycle 12 irinotecan/panitumumab 12/22/2020 Cycle 13 irinotecan/panitumumab 01/05/2021 01/20/2021 CT chest-mixed response with minimal increase in size of some lesions and minimal decrease in the size of other lesions.  Overall number of lesions is unchanged. Cycle 14 irinotecan/Panitumumab 02/05/2021 Cycle 15 irinotecan/Panitumumab 02/23/2021 Cycle 16 irinotecan/panitumumab 03/16/2021 Cycle 17 irinotecan/Panitumumab 04/13/2021 05/03/2021-CT chest-enlargement of pulmonary metastases, no new lesions 06/21/2021-cycle  1 Lonsurf/bevacizumab 07/19/2021-cycle 2 Lonsurf/bevacizumab 08/16/2021-cycle 3 Lonsurf/bevacizumab 09/09/2021 CT chest-bilateral lung nodules and masses with mixed response, overall stable to very minimally increased 09/13/2021 cycle 4 Lonsurf/bevacizumab 10/12/2021 cycle 5 Lonsurf/bevacizumab 10/21/2021 chest CT-bilateral pulmonary metastases without significant change.  No new or progressive disease within the chest. 11/08/2021 cycle 6 Lonsurf/bevacizumab 12/06/2021 cycle 7 Lonsurf/bevacizumab 01/03/2022 cycle 8 Lonsurf/bevacizumab CTs 01/25/2022-index lung lesion stable to mildly increased in size.  Mild retroperitoneal adenopathy, mildly increased in size compared to 12/16/2020. 02/01/2022 cycle 9 Lonsurf/bevacizumab 02/28/2022 cycle 10 Lonsurf/bevacizumab 03/28/2022 cycle 11 Lonsurf/bevacizumab CTs 04/21/2022-no change in bilateral pulmonary metastases, 2 new hypoattenuating liver lesions, new enlarged portacaval node Cycle 1 fruquintinib 05/24/2022 Cycle 2 fruquintinib 06/21/2022 Cycle 3 fruquintinib 07/19/2022 CTs 08/12/2022-stable lung nodules, hilar and retroperitoneal nodes, slight increase in size of liver metastases, stable right adrenal nodule Cycle 4 fruquintinib 08/16/2022 Cycle 5 fruquintinib 09/13/2022 Cycle 6 fruquintinib 10/11/2022 CTs 10/22/2022-progressive mediastinal, pericardial, and periportal adenopathy, enlargement of liver lesions, stable pulmonary nodules Cycle 1 FOLFOX 11/14/2022 Cycle 2 FOLFOX 11/29/2022, oxaliplatin dose reduced due to thrombocytopenia Cycle 3 FOLFOX 12/27/2022 Cycle 4 FOLFOX 01/10/2023   2.   Rectal bleeding and constipation secondary to #1   3.   History of peripheral vascular disease, status post left lower extremity vascular bypass surgery in April 2017   4.   History of nephrolithiasis   5.   History of Graves' disease treated with radioactive iodine   6.   Anxiety/depression   7.   Hypertension   8.   Hospitalization 01/19/2016 with wound dehiscence status  post secondary suture closure of abdominal wall   9.   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 Hyperglycemia 06/20/2016-diagnosed with diabetes, maintained on insulin   11.  Positive COVID test 12/13/2018; positive COVID test 01/25/2021    Disposition: Jeff [name] appears stable.  He has completed 3 cycles of FOLFOX.  He is tolerating treatment well.  Most recent CEA was further improved.  Plan to proceed with cycle 4 today as scheduled.  Restaging CTs after cycle 5.  CBC and chemistry panel reviewed.  Labs adequate to proceed with treatment.  He has mild thrombocytopenia.  He will contact the office with bleeding.  He will return for follow-up and treatment in 2 weeks.  We are available to see him sooner if needed.  Lonna Cobb ANP/GNP-BC   01/10/2023  8:44 AM

## 2023-01-10 NOTE — Progress Notes (Signed)
Patient seen by Lonna Cobb NP today  Vitals are within treatment parameters.  Labs reviewed by Lonna Cobb NP and are not all within treatment parameters. Plts 96 it's okay  to treat  Per physician team, patient is ready for treatment and there are NO modifications to the treatment plan.

## 2023-01-10 NOTE — Patient Instructions (Signed)
[facility]   Discharge Instructions: Thank you for choosing [facility] to provide your oncology and hematology care.   If you have a lab appointment with the Cancer Center, please go directly to the Cancer Center and check in at the registration area.   Wear comfortable clothing and clothing appropriate for easy access to any Portacath or PICC line.   We strive to give you quality time with your provider. You may need to reschedule your appointment if you arrive late (15 or more minutes).  Arriving late affects you and other patients whose appointments are after yours.  Also, if you miss three or more appointments without notifying the office, you may be dismissed from the clinic at the provider's discretion.      For prescription refill requests, have your pharmacy contact our office and allow 72 hours for refills to be completed.    Today you received the following chemotherapy and/or immunotherapy agents Oxaliplatin (ELOXATIN), Leucovorin & Flourouracil (ADRUCIL).      To help prevent nausea and vomiting after your treatment, we encourage you to take your nausea medication as directed.  BELOW ARE SYMPTOMS THAT SHOULD BE REPORTED IMMEDIATELY: *FEVER GREATER THAN 100.4 F (38 C) OR HIGHER *CHILLS OR SWEATING *NAUSEA AND VOMITING THAT IS NOT CONTROLLED WITH YOUR NAUSEA MEDICATION *UNUSUAL SHORTNESS OF BREATH *UNUSUAL BRUISING OR BLEEDING *URINARY PROBLEMS (pain or burning when urinating, or frequent urination) *BOWEL PROBLEMS (unusual diarrhea, constipation, pain near the anus) TENDERNESS IN MOUTH AND THROAT WITH OR WITHOUT PRESENCE OF ULCERS (sore throat, sores in mouth, or a toothache) UNUSUAL RASH, SWELLING OR PAIN  UNUSUAL VAGINAL DISCHARGE OR ITCHING   Items with * indicate a potential emergency and should be followed up as soon as possible or go to the Emergency Department if any problems should occur.  Please show the  CHEMOTHERAPY ALERT CARD or IMMUNOTHERAPY ALERT CARD at check-in to the Emergency Department and triage nurse.  Should you have questions after your visit or need to cancel or reschedule your appointment, please contact [facility]  Dept: [phone]  and follow the prompts.  Office hours are 8:00 a.m. to 4:30 p.m. Monday - Friday. Please note that voicemails left after 4:00 p.m. may not be returned until the following business day.  We are closed weekends and major holidays. You have access to a nurse at all times for urgent questions. Please call the main number to the clinic Dept: [phone] and follow the prompts.   For any non-urgent questions, you may also contact your provider using MyChart. We now offer e-Visits for anyone 5 and older to request care online for non-urgent symptoms. For details visit [email or web address].   Also download the MyChart app! Go to the app store, search "MyChart", open the app, select [facility], and log in with your MyChart username and password.  Oxaliplatin Injection What is this medication? OXALIPLATIN (ox AL i PLA tin) treats colorectal cancer. It works by slowing down the growth of cancer cells. This medicine may be used for other purposes; ask your health care provider or pharmacist if you have questions. COMMON BRAND NAME(S): Eloxatin What should I tell my care team before I take this medication? They need to know if you have any of these conditions: Heart disease History of irregular heartbeat or rhythm Liver disease Low blood cell levels (white cells, red cells, and platelets) Lung or breathing disease, such as asthma Take medications that treat  or prevent blood clots Tingling of the fingers, toes, or other nerve disorder An unusual or allergic reaction to oxaliplatin, other medications, foods, dyes, or preservatives If you or your partner are pregnant or trying to get pregnant Breast-feeding How should  I use this medication? This medication is injected into a vein. It is given by your care team in a hospital or clinic setting. Talk to your care team about the use of this medication in children. Special care may be needed. Overdosage: If you think you have taken too much of this medicine contact a poison control center or emergency room at once. NOTE: This medicine is only for you. Do not share this medicine with others. What if I miss a dose? Keep appointments for follow-up doses. It is important not to miss a dose. Call your care team if you are unable to keep an appointment. What may interact with this medication? Do not take this medication with any of the following: Cisapride Dronedarone Pimozide Thioridazine This medication may also interact with the following: Aspirin and aspirin-like medications Certain medications that treat or prevent blood clots, such as warfarin, apixaban, dabigatran, and rivaroxaban Cisplatin Cyclosporine Diuretics Medications for infection, such as acyclovir, adefovir, amphotericin B, bacitracin, cidofovir, foscarnet, ganciclovir, gentamicin, pentamidine, vancomycin NSAIDs, medications for pain and inflammation, such as ibuprofen or naproxen Other medications that cause heart rhythm changes Pamidronate Zoledronic acid This list may not describe all possible interactions. Give your health care provider a list of all the medicines, herbs, non-prescription drugs, or dietary supplements you use. Also tell them if you smoke, drink alcohol, or use illegal drugs. Some items may interact with your medicine. What should I watch for while using this medication? Your condition will be monitored carefully while you are receiving this medication. You may need blood work while taking this medication. This medication may make you feel generally unwell. This is not uncommon as chemotherapy can affect healthy cells as well as cancer cells. Report any side effects. Continue  your course of treatment even though you feel ill unless your care team tells you to stop. This medication may increase your risk of getting an infection. Call your care team for advice if you get a fever, chills, sore throat, or other symptoms of a cold or flu. Do not treat yourself. Try to avoid being around people who are sick. Avoid taking medications that contain aspirin, acetaminophen, ibuprofen, naproxen, or ketoprofen unless instructed by your care team. These medications may hide a fever. Be careful brushing or flossing your teeth or using a toothpick because you may get an infection or bleed more easily. If you have any dental work done, tell your dentist you are receiving this medication. This medication can make you more sensitive to cold. Do not drink cold drinks or use ice. Cover exposed skin before coming in contact with cold temperatures or cold objects. When out in cold weather wear warm clothing and cover your mouth and nose to warm the air that goes into your lungs. Tell your care team if you get sensitive to the cold. Talk to your care team if you or your partner are pregnant or think either of you might be pregnant. This medication can cause serious birth defects if taken during pregnancy and for 9 months after the last dose. A negative pregnancy test is required before starting this medication. A reliable form of contraception is recommended while taking this medication and for 9 months after the last dose. Talk to your care  team about effective forms of contraception. Do not father a child while taking this medication and for 6 months after the last dose. Use a condom while having sex during this time period. Do not breastfeed while taking this medication and for 3 months after the last dose. This medication may cause infertility. Talk to your care team if you are concerned about your fertility. What side effects may I notice from receiving this medication? Side effects that you  should report to your care team as soon as possible: Allergic reactions--skin rash, itching, hives, swelling of the face, lips, tongue, or throat Bleeding--bloody or black, tar-like stools, vomiting blood or brown material that looks like coffee grounds, red or dark brown urine, small red or purple spots on skin, unusual bruising or bleeding Dry cough, shortness of breath or trouble breathing Heart rhythm changes--fast or irregular heartbeat, dizziness, feeling faint or lightheaded, chest pain, trouble breathing Infection--fever, chills, cough, sore throat, wounds that don't heal, pain or trouble when passing urine, general feeling of discomfort or being unwell Liver injury--right upper belly pain, loss of appetite, nausea, light-colored stool, dark yellow or brown urine, yellowing skin or eyes, unusual weakness or fatigue Low red blood cell level--unusual weakness or fatigue, dizziness, headache, trouble breathing Muscle injury--unusual weakness or fatigue, muscle pain, dark yellow or brown urine, decrease in amount of urine Pain, tingling, or numbness in the hands or feet Sudden and severe headache, confusion, change in vision, seizures, which may be signs of posterior reversible encephalopathy syndrome (PRES) Unusual bruising or bleeding Side effects that usually do not require medical attention (report to your care team if they continue or are bothersome): Diarrhea Nausea Pain, redness, or swelling with sores inside the mouth or throat Unusual weakness or fatigue Vomiting This list may not describe all possible side effects. Call your doctor for medical advice about side effects. You may report side effects to FDA at 1-800-FDA-1088. Where should I keep my medication? This medication is given in a hospital or clinic. It will not be stored at home. NOTE: This sheet is a summary. It may not cover all possible information. If you have questions about this medicine, talk to your doctor,  pharmacist, or health care provider.  2024 Elsevier/Gold Standard (2022-02-15 00:00:00)   Leucovorin Injection What is this medication? LEUCOVORIN (loo koe VOR in) prevents side effects from certain medications, such as methotrexate. It works by increasing folate levels. This helps protect healthy cells in your body. It may also be used to treat anemia caused by low levels of folate. It can also be used with fluorouracil, a type of chemotherapy, to treat colorectal cancer. It works by increasing the effects of fluorouracil in the body. This medicine may be used for other purposes; ask your health care provider or pharmacist if you have questions. What should I tell my care team before I take this medication? They need to know if you have any of these conditions: Anemia from low levels of vitamin B12 in the blood An unusual or allergic reaction to leucovorin, folic acid, other medications, foods, dyes, or preservatives Pregnant or trying to get pregnant Breastfeeding How should I use this medication? This medication is injected into a vein or a muscle. It is given by your care team in a hospital or clinic setting. Talk to your care team about the use of this medication in children. Special care may be needed. Overdosage: If you think you have taken too much of this medicine contact a poison control  center or emergency room at once. NOTE: This medicine is only for you. Do not share this medicine with others. What if I miss a dose? Keep appointments for follow-up doses. It is important not to miss your dose. Call your care team if you are unable to keep an appointment. What may interact with this medication? Capecitabine Fluorouracil Phenobarbital Phenytoin Primidone Trimethoprim;sulfamethoxazole This list may not describe all possible interactions. Give your health care provider a list of all the medicines, herbs, non-prescription drugs, or dietary supplements you use. Also tell them if  you smoke, drink alcohol, or use illegal drugs. Some items may interact with your medicine. What should I watch for while using this medication? Your condition will be monitored carefully while you are receiving this medication. This medication may increase the side effects of 5-fluorouracil. Tell your care team if you have diarrhea or mouth sores that do not get better or that get worse. What side effects may I notice from receiving this medication? Side effects that you should report to your care team as soon as possible: Allergic reactions--skin rash, itching, hives, swelling of the face, lips, tongue, or throat This list may not describe all possible side effects. Call your doctor for medical advice about side effects. You may report side effects to FDA at 1-800-FDA-1088. Where should I keep my medication? This medication is given in a hospital or clinic. It will not be stored at home. NOTE: This sheet is a summary. It may not cover all possible information. If you have questions about this medicine, talk to your doctor, pharmacist, or health care provider.  2024 Elsevier/Gold Standard (2021-10-05 00:00:00)   Irinotecan Injection What is this medication? IRINOTECAN (ir in oh TEE kan) treats some types of cancer. It works by slowing down the growth of cancer cells. This medicine may be used for other purposes; ask your health care provider or pharmacist if you have questions. COMMON BRAND NAME(S): Camptosar What should I tell my care team before I take this medication? They need to know if you have any of these conditions: Dehydration Diarrhea Infection, especially a viral infection, such as chickenpox, cold sores, herpes Liver disease Low blood cell levels (white cells, red cells, and platelets) Low levels of electrolytes, such as calcium, magnesium, or potassium in your blood Recent or ongoing radiation An unusual or allergic reaction to irinotecan, other medications, foods, dyes,  or preservatives If you or your partner are pregnant or trying to get pregnant Breast-feeding How should I use this medication? This medication is injected into a vein. It is given by your care team in a hospital or clinic setting. Talk to your care team about the use of this medication in children. Special care may be needed. Overdosage: If you think you have taken too much of this medicine contact a poison control center or emergency room at once. NOTE: This medicine is only for you. Do not share this medicine with others. What if I miss a dose? Keep appointments for follow-up doses. It is important not to miss your dose. Call your care team if you are unable to keep an appointment. What may interact with this medication? Do not take this medication with any of the following: Cobicistat Itraconazole This medication may also interact with the following: Certain antibiotics, such as clarithromycin, rifampin, rifabutin Certain antivirals for HIV or AIDS Certain medications for fungal infections, such as ketoconazole, posaconazole, voriconazole Certain medications for seizures, such as carbamazepine, phenobarbital, phenytoin Gemfibrozil Nefazodone St. John's wort This  list may not describe all possible interactions. Give your health care provider a list of all the medicines, herbs, non-prescription drugs, or dietary supplements you use. Also tell them if you smoke, drink alcohol, or use illegal drugs. Some items may interact with your medicine. What should I watch for while using this medication? Your condition will be monitored carefully while you are receiving this medication. You may need blood work while taking this medication. This medication may make you feel generally unwell. This is not uncommon as chemotherapy can affect healthy cells as well as cancer cells. Report any side effects. Continue your course of treatment even though you feel ill unless your care team tells you to  stop. This medication can cause serious side effects. To reduce the risk, your care team may give you other medications to take before receiving this one. Be sure to follow the directions from your care team. This medication may affect your coordination, reaction time, or judgement. Do not drive or operate machinery until you know how this medication affects you. Sit up or stand slowly to reduce the risk of dizzy or fainting spells. Drinking alcohol with this medication can increase the risk of these side effects. This medication may increase your risk of getting an infection. Call your care team for advice if you get a fever, chills, sore throat, or other symptoms of a cold or flu. Do not treat yourself. Try to avoid being around people who are sick. Avoid taking medications that contain aspirin, acetaminophen, ibuprofen, naproxen, or ketoprofen unless instructed by your care team. These medications may hide a fever. This medication may increase your risk to bruise or bleed. Call your care team if you notice any unusual bleeding. Be careful brushing or flossing your teeth or using a toothpick because you may get an infection or bleed more easily. If you have any dental work done, tell your dentist you are receiving this medication. Talk to your care team if you or your partner are pregnant or think either of you might be pregnant. This medication can cause serious birth defects if taken during pregnancy and for 6 months after the last dose. You will need a negative pregnancy test before starting this medication. Contraception is recommended while taking this medication and for 6 months after the last dose. Your care team can help you find the option that works for you. Do not father a child while taking this medication and for 3 months after the last dose. Use a condom for contraception during this time period. Do not breastfeed while taking this medication and for 7 days after the last dose. This  medication may cause infertility. Talk to your care team if you are concerned about your fertility. What side effects may I notice from receiving this medication? Side effects that you should report to your care team as soon as possible: Allergic reactions--skin rash, itching, hives, swelling of the face, lips, tongue, or throat Dry cough, shortness of breath or trouble breathing Increased saliva or tears, increased sweating, stomach cramping, diarrhea, small pupils, unusual weakness or fatigue, slow heartbeat Infection--fever, chills, cough, sore throat, wounds that don't heal, pain or trouble when passing urine, general feeling of discomfort or being unwell Kidney injury--decrease in the amount of urine, swelling of the ankles, hands, or feet Low red blood cell level--unusual weakness or fatigue, dizziness, headache, trouble breathing Severe or prolonged diarrhea Unusual bruising or bleeding Side effects that usually do not require medical attention (report to your care team  if they continue or are bothersome): Constipation Diarrhea Hair loss Loss of appetite Nausea Stomach pain This list may not describe all possible side effects. Call your doctor for medical advice about side effects. You may report side effects to FDA at 1-800-FDA-1088. Where should I keep my medication? This medication is given in a hospital or clinic. It will not be stored at home. NOTE: This sheet is a summary. It may not cover all possible information. If you have questions about this medicine, talk to your doctor, pharmacist, or health care provider.  2024 Elsevier/Gold Standard (2021-09-13 00:00:00)   Fluorouracil Injection What is this medication? FLUOROURACIL (flure oh YOOR a sil) treats some types of cancer. It works by slowing down the growth of cancer cells. This medicine may be used for other purposes; ask your health care provider or pharmacist if you have questions. COMMON BRAND NAME(S): Adrucil What  should I tell my care team before I take this medication? They need to know if you have any of these conditions: Blood disorders Dihydropyrimidine dehydrogenase (DPD) deficiency Infection, such as chickenpox, cold sores, herpes Kidney disease Liver disease Poor nutrition Recent or ongoing radiation therapy An unusual or allergic reaction to fluorouracil, other medications, foods, dyes, or preservatives If you or your partner are pregnant or trying to get pregnant Breast-feeding How should I use this medication? This medication is injected into a vein. It is administered by your care team in a hospital or clinic setting. Talk to your care team about the use of this medication in children. Special care may be needed. Overdosage: If you think you have taken too much of this medicine contact a poison control center or emergency room at once. NOTE: This medicine is only for you. Do not share this medicine with others. What if I miss a dose? Keep appointments for follow-up doses. It is important not to miss your dose. Call your care team if you are unable to keep an appointment. What may interact with this medication? Do not take this medication with any of the following: Live virus vaccines This medication may also interact with the following: Medications that treat or prevent blood clots, such as warfarin, enoxaparin, dalteparin This list may not describe all possible interactions. Give your health care provider a list of all the medicines, herbs, non-prescription drugs, or dietary supplements you use. Also tell them if you smoke, drink alcohol, or use illegal drugs. Some items may interact with your medicine. What should I watch for while using this medication? Your condition will be monitored carefully while you are receiving this medication. This medication may make you feel generally unwell. This is not uncommon as chemotherapy can affect healthy cells as well as cancer cells. Report any  side effects. Continue your course of treatment even though you feel ill unless your care team tells you to stop. In some cases, you may be given additional medications to help with side effects. Follow all directions for their use. This medication may increase your risk of getting an infection. Call your care team for advice if you get a fever, chills, sore throat, or other symptoms of a cold or flu. Do not treat yourself. Try to avoid being around people who are sick. This medication may increase your risk to bruise or bleed. Call your care team if you notice any unusual bleeding. Be careful brushing or flossing your teeth or using a toothpick because you may get an infection or bleed more easily. If you have any dental  work done, tell your dentist you are receiving this medication. Avoid taking medications that contain aspirin, acetaminophen, ibuprofen, naproxen, or ketoprofen unless instructed by your care team. These medications may hide a fever. Do not treat diarrhea with over the counter products. Contact your care team if you have diarrhea that lasts more than 2 days or if it is severe and watery. This medication can make you more sensitive to the sun. Keep out of the sun. If you cannot avoid being in the sun, wear protective clothing and sunscreen. Do not use sun lamps, tanning beds, or tanning booths. Talk to your care team if you or your partner wish to become pregnant or think you might be pregnant. This medication can cause serious birth defects if taken during pregnancy and for 3 months after the last dose. A reliable form of contraception is recommended while taking this medication and for 3 months after the last dose. Talk to your care team about effective forms of contraception. Do not father a child while taking this medication and for 3 months after the last dose. Use a condom while having sex during this time period. Do not breastfeed while taking this medication. This medication may  cause infertility. Talk to your care team if you are concerned about your fertility. What side effects may I notice from receiving this medication? Side effects that you should report to your care team as soon as possible: Allergic reactions--skin rash, itching, hives, swelling of the face, lips, tongue, or throat Heart attack--pain or tightness in the chest, shoulders, arms, or jaw, nausea, shortness of breath, cold or clammy skin, feeling faint or lightheaded Heart failure--shortness of breath, swelling of the ankles, feet, or hands, sudden weight gain, unusual weakness or fatigue Heart rhythm changes--fast or irregular heartbeat, dizziness, feeling faint or lightheaded, chest pain, trouble breathing High ammonia level--unusual weakness or fatigue, confusion, loss of appetite, nausea, vomiting, seizures Infection--fever, chills, cough, sore throat, wounds that don't heal, pain or trouble when passing urine, general feeling of discomfort or being unwell Low red blood cell level--unusual weakness or fatigue, dizziness, headache, trouble breathing Pain, tingling, or numbness in the hands or feet, muscle weakness, change in vision, confusion or trouble speaking, loss of balance or coordination, trouble walking, seizures Redness, swelling, and blistering of the skin over hands and feet Severe or prolonged diarrhea Unusual bruising or bleeding Side effects that usually do not require medical attention (report to your care team if they continue or are bothersome): Dry skin Headache Increased tears Nausea Pain, redness, or swelling with sores inside the mouth or throat Sensitivity to light Vomiting This list may not describe all possible side effects. Call your doctor for medical advice about side effects. You may report side effects to FDA at 1-800-FDA-1088. Where should I keep my medication? This medication is given in a hospital or clinic. It will not be stored at home. NOTE: This sheet is a  summary. It may not cover all possible information. If you have questions about this medicine, talk to your doctor, pharmacist, or health care provider.  2024 [employer]/Gold Standard (2021-09-07 00:00:00)  The chemotherapy medication bag should finish at 46 hours, 96 hours, or 7 days. For example, if your pump is scheduled for 46 hours and it was put on at 4:00 p.m., it should finish at 2:00 p.m. the day it is scheduled to come off regardless of your appointment time.     Estimated time to finish at 11:30 p.m. on Thursday 01/12/2023 .  If the display on your pump reads "Low Volume" and it is beeping, take the batteries out of the pump and come to the cancer center for it to be taken off.   If the pump alarms go off prior to the pump reading "Low Volume" then call [phone] and someone can assist you.  If the plunger comes out and the chemotherapy medication is leaking out, please use your home chemo spill kit to clean up the spill. Do NOT use paper towels or other household products.  If you have problems or questions regarding your pump, please call either [phone] (24 hours a day) or the cancer center Monday-Friday 8:00 a.m.- 4:30 p.m. at the clinic number and we will assist you. If you are unable to get assistance, then go to the nearest Emergency Department and ask the staff to contact the IV team for assistance.

## 2023-01-12 ENCOUNTER — Inpatient Hospital Stay: Payer: Medicaid Other

## 2023-01-12 VITALS — BP 125/85 | HR 90 | Temp 98.2°F | Resp 18

## 2023-01-12 DIAGNOSIS — C187 Malignant neoplasm of sigmoid colon: Secondary | ICD-10-CM

## 2023-01-12 DIAGNOSIS — Z5111 Encounter for antineoplastic chemotherapy: Secondary | ICD-10-CM | POA: Diagnosis not present

## 2023-01-12 MED ORDER — HEPARIN SOD (PORK) LOCK FLUSH 100 UNIT/ML IV SOLN
500.0000 [IU] | Freq: Once | INTRAVENOUS | Status: AC | PRN
  Administered 2023-01-12: 500 [IU]

## 2023-01-12 MED ORDER — SODIUM CHLORIDE 0.9% FLUSH
10.0000 mL | INTRAVENOUS | Status: DC | PRN
  Administered 2023-01-12: 10 mL

## 2023-01-12 NOTE — Patient Instructions (Signed)
Heparin Injection What is this medication? HEPARIN INJECTION (HEP a rin) prevents and treats blood clots. It belongs to a group of medications called blood thinners. This medicine may be used for other purposes; ask your health care provider or pharmacist if you have questions. COMMON BRAND NAME(S): Hep-Lock, Hep-Lock U/P, Hepflush-10, Monoject Prefill Advanced Heparin Lock Flush, SASH Normal Saline and Heparin What should I tell my care team before I take this medication? They need to know if you have any of these conditions: Bleeding disorders, such as hemophilia or low blood platelets Bowel disease or diverticulitis Endocarditis High blood pressure Liver disease Recent surgery Stomach ulcers An unusual or allergic reaction to heparin, other medications, foods, dyes, or preservatives Pregnant, trying to get pregnant, or recently pregnant Breastfeeding How should I use this medication? This medication is injected into a vein or under the skin. It is usually given by your care team in a hospital or clinic setting. If you get this medication at home, you will be taught how to prepare and give it. For your therapy to work as well as possible, take each dose exactly as prescribed on the prescription label. Do not skip doses. Skipping doses or stopping this medication can increase your risk of a blood clot. Keep taking this medication unless your care team tells you to stop. It is important that you put your used needles and syringes in a special sharps container. Do not put them in a trash can. If you do not have a sharps container, call your pharmacist or care team to get one. Talk to your care team about the use of this medication in children. While this medication may be prescribed for children for selected conditions, precautions do apply. Overdosage: If you think you have taken too much of this medicine contact a poison control center or emergency room at once. NOTE: This medicine is only  for you. Do not share this medicine with others. What if I miss a dose? If you miss a dose, take it as soon as you can. If it is almost time for your next dose, take only that dose. Do not take double or extra doses. What may interact with this medication? Do not take this medication with any of the following: Aspirin and aspirin-like medications Mifepristone Medications that treat or prevent blood clots, such as warfarin, enoxaparin, dalteparin Palifermin Protamine This medication may also interact with the following: Dextran Digoxin Hydroxychloroquine Medications for treating colds or allergies Nicotine NSAIDs, medications for pain and inflammation, such as ibuprofen or naproxen Phenylbutazone Tetracycline antibiotics This list may not describe all possible interactions. Give your health care provider a list of all the medicines, herbs, non-prescription drugs, or dietary supplements you use. Also tell them if you smoke, drink alcohol, or use illegal drugs. Some items may interact with your medicine. What should I watch for while using this medication? Visit your care team for regular checks on your progress. You may need blood work while taking this medication. Your condition will be monitored carefully while you are receiving this medication. Wear a medical ID bracelet or chain, and carry a card that describes your disease and details of your medication and dosage times. Notify your care team at once if you have cold, blue hands or feet. If you are going to need surgery or other procedure, tell your care team that you are using this medication. Avoid sports and activities that might cause injury while you are using this medication. Severe falls or injuries can   cause unseen bleeding. Be careful when using sharp tools or knives. Consider using an electric razor. Take special care brushing or flossing your teeth. Report any injuries, bruising, or red spots on the skin to your care  team. Using this medication for a long time may weaken your bones. The risk of bone fractures may be increased. Talk to your care team about your bone health. You should make sure that you get enough calcium and vitamin D while you are taking this medication. Discuss the foods you eat and the vitamins you take with your care team. Wear a medical ID bracelet or chain. Carry a card that describes your condition. List the medications and doses you take on the card. What side effects may I notice from receiving this medication? Side effects that you should report to your care team as soon as possible: Allergic reactions--skin rash, itching, hives, swelling of the face, lips, tongue, or throat Bleeding--bloody or black, tar-like stools, vomiting blood or brown material that looks like coffee grounds, red or dark brown urine, small red or purple spots on skin, unusual bruising or bleeding Blood clot--pain, swelling, or warmth in the leg, shortness of breath, chest pain Side effects that usually do not require medical attention (report to your care team if they continue or are bothersome): Pain, redness, or irritation at injection site This list may not describe all possible side effects. Call your doctor for medical advice about side effects. You may report side effects to FDA at 1-800-FDA-1088. Where should I keep my medication? Keep out of the reach of children and pets. Store unopened vials at room temperature between 15 and 30 degrees C (59 and 86 degrees F). Do not freeze. Do not use if solution is discolored or particulate matter is present. To get rid of medications that are no longer needed or have expired: Take the medication to a medication take-back program. Check with your pharmacy or law enforcement to find a location. If you cannot return the medication, ask your pharmacist or care team how to get rid of this medication safely. NOTE: This sheet is a summary. It may not cover all possible  information. If you have questions about this medicine, talk to your doctor, pharmacist, or health care provider.  2024 Elsevier/Gold Standard (2021-10-21 00:00:00)  

## 2023-01-22 ENCOUNTER — Other Ambulatory Visit: Payer: Self-pay | Admitting: Oncology

## 2023-01-22 DIAGNOSIS — C187 Malignant neoplasm of sigmoid colon: Secondary | ICD-10-CM

## 2023-01-24 ENCOUNTER — Inpatient Hospital Stay: Payer: Medicaid Other

## 2023-01-24 ENCOUNTER — Inpatient Hospital Stay: Payer: Medicaid Other | Admitting: Oncology

## 2023-01-24 ENCOUNTER — Inpatient Hospital Stay: Payer: Medicaid Other | Attending: Oncology

## 2023-01-24 ENCOUNTER — Encounter: Payer: Self-pay | Admitting: *Deleted

## 2023-01-24 VITALS — BP 142/88 | HR 100 | Temp 98.1°F | Resp 20 | Ht 70.0 in | Wt 207.2 lb

## 2023-01-24 VITALS — BP 159/103 | HR 97 | Resp 18

## 2023-01-24 DIAGNOSIS — Z5111 Encounter for antineoplastic chemotherapy: Secondary | ICD-10-CM | POA: Diagnosis present

## 2023-01-24 DIAGNOSIS — Z23 Encounter for immunization: Secondary | ICD-10-CM | POA: Insufficient documentation

## 2023-01-24 DIAGNOSIS — C187 Malignant neoplasm of sigmoid colon: Secondary | ICD-10-CM

## 2023-01-24 DIAGNOSIS — C787 Secondary malignant neoplasm of liver and intrahepatic bile duct: Secondary | ICD-10-CM | POA: Insufficient documentation

## 2023-01-24 LAB — CMP (CANCER CENTER ONLY)
ALT: 14 U/L (ref 0–44)
AST: 23 U/L (ref 15–41)
Albumin: 3.6 g/dL (ref 3.5–5.0)
Alkaline Phosphatase: 108 U/L (ref 38–126)
Anion gap: 8 (ref 5–15)
BUN: 9 mg/dL (ref 6–20)
CO2: 29 mmol/L (ref 22–32)
Calcium: 9 mg/dL (ref 8.9–10.3)
Chloride: 100 mmol/L (ref 98–111)
Creatinine: 1.02 mg/dL (ref 0.61–1.24)
GFR, Estimated: >60 mL/min (ref >60)
Glucose, Bld: 221 mg/dL — ABNORMAL HIGH (ref 70–99)
Potassium: 3.9 mmol/L (ref 3.5–5.1)
Sodium: 137 mmol/L (ref 135–145)
Total Bilirubin: 0.5 mg/dL (ref 0.3–1.2)
Total Protein: 7.5 g/dL (ref 6.5–8.1)

## 2023-01-24 LAB — CBC WITH DIFFERENTIAL (CANCER CENTER ONLY)
Abs Immature Granulocytes: 0.02 10*3/uL (ref 0.00–0.07)
Basophils Absolute: 0 10*3/uL (ref 0.0–0.1)
Basophils Relative: 0 %
Eosinophils Absolute: 0.1 10*3/uL (ref 0.0–0.5)
Eosinophils Relative: 2 %
HCT: 31.5 % — ABNORMAL LOW (ref 39.0–52.0)
Hemoglobin: 10 g/dL — ABNORMAL LOW (ref 13.0–17.0)
Immature Granulocytes: 0 %
Lymphocytes Relative: 41 %
Lymphs Abs: 2.1 10*3/uL (ref 0.7–4.0)
MCH: 28.2 pg (ref 26.0–34.0)
MCHC: 31.7 g/dL (ref 30.0–36.0)
MCV: 88.7 fL (ref 80.0–100.0)
Monocytes Absolute: 0.5 10*3/uL (ref 0.1–1.0)
Monocytes Relative: 10 %
Neutro Abs: 2.4 10*3/uL (ref 1.7–7.7)
Neutrophils Relative %: 47 %
Platelet Count: 84 10*3/uL — ABNORMAL LOW (ref 150–400)
RBC: 3.55 MIL/uL — ABNORMAL LOW (ref 4.22–5.81)
RDW: 16.1 % — ABNORMAL HIGH (ref 11.5–15.5)
WBC Count: 5.2 10*3/uL (ref 4.0–10.5)
nRBC: 0 % (ref 0.0–0.2)

## 2023-01-24 LAB — TOTAL PROTEIN, URINE DIPSTICK: Protein, ur: NEGATIVE mg/dL

## 2023-01-24 LAB — CEA (ACCESS): CEA (CHCC): 100.49 ng/mL — ABNORMAL HIGH (ref 0.00–5.00)

## 2023-01-24 MED ORDER — OXALIPLATIN CHEMO INJECTION 100 MG/20ML
50.0000 mg/m2 | Freq: Once | INTRAVENOUS | Status: AC
  Administered 2023-01-24: 100 mg via INTRAVENOUS
  Filled 2023-01-24: qty 20

## 2023-01-24 MED ORDER — SODIUM CHLORIDE 0.9 % IV SOLN
2000.0000 mg/m2 | INTRAVENOUS | Status: DC
  Administered 2023-01-24: 4350 mg via INTRAVENOUS
  Filled 2023-01-24: qty 87

## 2023-01-24 MED ORDER — PALONOSETRON HCL INJECTION 0.25 MG/5ML
0.2500 mg | Freq: Once | INTRAVENOUS | Status: AC
  Administered 2023-01-24: 0.25 mg via INTRAVENOUS
  Filled 2023-01-24: qty 5

## 2023-01-24 MED ORDER — DIPHENHYDRAMINE HCL 50 MG/ML IJ SOLN
25.0000 mg | Freq: Once | INTRAMUSCULAR | Status: AC
  Administered 2023-01-24: 25 mg via INTRAVENOUS
  Filled 2023-01-24: qty 1

## 2023-01-24 MED ORDER — FLUOROURACIL CHEMO INJECTION 2.5 GM/50ML
400.0000 mg/m2 | Freq: Once | INTRAVENOUS | Status: AC
  Administered 2023-01-24: 850 mg via INTRAVENOUS
  Filled 2023-01-24: qty 17

## 2023-01-24 MED ORDER — INFLUENZA VIRUS VACC SPLIT PF (FLUZONE) 0.5 ML IM SUSY
0.5000 mL | PREFILLED_SYRINGE | Freq: Once | INTRAMUSCULAR | Status: AC
  Administered 2023-01-24: 0.5 mL via INTRAMUSCULAR
  Filled 2023-01-24: qty 0.5

## 2023-01-24 MED ORDER — SODIUM CHLORIDE 0.9 % IV SOLN
10.0000 mg | Freq: Once | INTRAVENOUS | Status: AC
  Administered 2023-01-24: 10 mg via INTRAVENOUS
  Filled 2023-01-24: qty 10

## 2023-01-24 MED ORDER — FAMOTIDINE IN NACL 20-0.9 MG/50ML-% IV SOLN
20.0000 mg | Freq: Once | INTRAVENOUS | Status: AC
  Administered 2023-01-24: 20 mg via INTRAVENOUS
  Filled 2023-01-24: qty 50

## 2023-01-24 MED ORDER — DEXTROSE 5 % IV SOLN: Freq: Once | INTRAVENOUS | Status: AC

## 2023-01-24 MED ORDER — LEUCOVORIN CALCIUM INJECTION 350 MG
400.0000 mg/m2 | Freq: Once | INTRAVENOUS | Status: AC
  Administered 2023-01-24: 872 mg via INTRAVENOUS
  Filled 2023-01-24: qty 43.6

## 2023-01-24 NOTE — Progress Notes (Signed)
Patient seen by Dr. Truett Perna today  Vitals are within treatment parameters.OK to proceed w/pulse 100   Labs reviewed by Dr. Truett Perna and are not all within treatment parameters. Platelet 85,000-ok to proceed  Per physician team, patient is ready for treatment and there are NO modifications to the treatment plan.  Requests flu vaccine today as well.

## 2023-01-24 NOTE — Progress Notes (Signed)
[facility] OFFICE PROGRESS NOTE   Diagnosis: Colon cancer  INTERVAL HISTORY:   [name] completed another cycle of FOLFOX on 01/10/2023.  No nausea/vomiting, mouth sores, or diarrhea.  No symptom of an allergic reaction.  He has a cold sensation in the feet.  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  He is having bowel movements.  He has occasional abdominal fullness and vomiting.  No bleeding.  Objective:  Vital signs in last 24 hours:  Blood pressure (!) 142/88, pulse 100, temperature 98.1 F (36.7 C), temperature source Oral, resp. rate 20, height 5\' 10"  (1.778 m), weight 207 lb 3.2 oz (94 kg), SpO2 100%.    HEENT: Thrush or ulcers Resp: Lungs clear bilaterally Cardio: Regular rate and rhythm GI: No hepatosplenomegaly, no mass, nontender Vascular: No leg edema Neuro: Very mild loss of vibratory sense at the fingertips bilaterally    Portacath/PICC-without erythema  Lab Results:  Lab Results  Component Value Date   WBC 5.2 01/24/2023   HGB 10.0 (L) 01/24/2023   HCT 31.5 (L) 01/24/2023   MCV 88.7 01/24/2023   PLT 84 (L) 01/24/2023   NEUTROABS 2.4 01/24/2023    CMP  Lab Results  Component Value Date   NA 137 01/24/2023   K 3.9 01/24/2023   CL 100 01/24/2023   CO2 29 01/24/2023   GLUCOSE 221 (H) 01/24/2023   BUN 9 01/24/2023   CREATININE 1.02 01/24/2023   CALCIUM 9.0 01/24/2023   PROT 7.5 01/24/2023   ALBUMIN 3.6 01/24/2023   AST 23 01/24/2023   ALT 14 01/24/2023   ALKPHOS 108 01/24/2023   BILITOT 0.5 01/24/2023   GFRNONAA >60 01/24/2023   GFRAA >60 01/30/2020    Lab Results  Component Value Date   CEA1 3.19 09/10/2020   CEA 105.23 (H) 01/10/2023     Medications: I have reviewed the patient's current medications.   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, biopsy confirmed  adenocarcinoma         CTs chest, abdomen, and pelvis on 12/11/2015-indeterminate tiny pulmonary nodules, multiple liver metastases, small nodes in the sigmoid mesocolon Laparoscopic sigmoid colectomy, liver biopsy, Port-A-Cath placement 01/14/2016 Pathology sigmoid colon resection- colonic adenocarcinoma, 5.3 cm extending into pericolonic connective tissue, positive lymph-vascular invasion, positive perineural invasion, negative margins, metastatic carcinoma in 9 of 28 lymph nodes Pathology liver biopsy-metastatic colorectal adenocarcinoma MSI stable; mismatch repair protein normal APC alteration, K RAS wild-type, no BRAF mutation Cycle 1 FOLFOX 02/02/2016 Cycle 2 FOLFOX 02/15/2016 Cycle 3 FOLFOX 02/29/2016 Cycle 4 FOLFOX 03/14/2016 Cycle 5 FOLFOX 03/28/2016 Cycle 6 FOLFOX 04/11/2016 (oxaliplatin held secondary to thrombocytopenia) 04/21/2016 restaging CTs-difficulty evaluating liver lesions due to hepatic steatosis. Stable right adrenal nodule. No adenopathy or local recurrence near the rectosigmoid anastomotic site. Cycle 7 FOLFOX 04/25/2016 MRI liver 05/02/2016-partial improvement in hepatic metastases Cycle 8 FOLFOX 05/10/2016 Cycle 9 FOLFOX 05/23/2016 (oxaliplatin held due to thrombocytopenia) Cycle 10 FOLFOX 06/06/2016 (oxaliplatin dose reduced due to thrombocytopenia) Cycle 11 FOLFOX 06/27/2016 (oxaliplatin held due to neuropathy) Cycle 12 FOLFOX 07/11/2016 (oxaliplatin held) Initiation of maintenance Xeloda 7 days on/7 days off 07/27/2016 MRI liver 11/18/2016-decrease in hepatic metastatic disease. No new or progressive disease identified within the abdomen. Continuation of Xeloda 7 days on/7 days off MRI liver 04/27/2017-previous liver lesions not identified, no new lesions, no lymphadenopathy Xeloda continued 7 days on/7 days off MRI liver 12/04/2017 - no evidence  of metastatic disease, hepatic steatosis Xeloda continued 7 days on/7 days off MRI liver 07/15/2018- no evidence of  metastatic disease.  Stable severe hepatic steatosis. Xeloda continued 7 days on/7 days off MRI liver 03/16/2019-hepatic steatosis, no liver mass, focal area of intrahepatic biliary dilatation in segments 2 and 3 of the left lobe-increased Xeloda continued 7 days on/7 days off MRI abdomen 08/19/2019-no findings to suggest liver metastases.  Bilateral lung nodules measuring up to 2.8 cm, progressive and more conspicuous than on previous exam CT chest 08/29/2019-multiple pulmonary metastases, new from 04/21/2016 Cycle 1 FOLFIRI/bevacizumab 09/09/2019 Cycle 2 FOLFIRI/bevacizumab 09/26/2019  Cycle 3 FOLFIRI/bevacizumab 10/10/2019 Cycle 4 FOLFIRI/bevacizumab 10/24/2019 Cycle 5 FOLFIRI/bevacizumab 11/07/2019 CT chest 11/14/2019-decreased size of lung nodules, no new lesions, hepatic steatosis Cycle 6 FOLFIRI/bevacizumab 11/21/2019 Cycle 7 FOLFIRI/bevacizumab 12/05/2019 Cycle 8 FOLFIRI/bevacizumab 12/19/2019 Cycle 9 FOLFIRI/bevacizumab 01/02/2020 Cycle 10 FOLFIRI/bevacizumab 01/16/2020 CT chest 01/29/2020-stable bilateral pulmonary metastases.  No new or progressive metastatic disease in the chest. Cycle 11 FOLFIRI/bevacizumab 01/30/2020 Cycle 12 FOLFIRI/bevacizumab 02/19/2020 Cycle 13 FOLFIRI/bevacizumab 03/12/2020 Cycle 14 FOLFIRI/bevacizumab 04/02/2020 Cycle 15 FOLFIRI/bevacizumab 04/23/2020 Cycle 16 FOLFIRI/bevacizumab 05/21/2020 CT chest 06/09/2020-mild progression pulmonary metastasis.  Some lesions have increased in size while others are similar. Cycle 1 irinotecan/Panitumumab 06/18/2020 Cycle 2 irinotecan/Panitumumab 07/02/2020 Cycle 3 irinotecan/panitumumab 07/16/2020 Cycle 4 irinotecan/Panitumumab 07/30/2020 Cycle 5 irinotecan/Panitumumab 08/13/2020, Emend added Cycle 6 irinotecan/Panitumumab 08/27/2020 CT chest 09/08/2020-decreased size of pulmonary nodules, no progressive disease Cycle 7 irinotecan/panitumumab 09/02/2020 Cycle 8 irinotecan/panitumumab 09/24/2020 Cycle 9 irinotecan/Panitumumab 10/08/2020 10/22/2020  treatment held due to left foot fracture, need for surgery Cycle 10 irinotecan/panitumumab 11/24/2020 Cycle 11 irinotecan/Panitumumab 12/08/2020 Cycle 12 irinotecan/panitumumab 12/22/2020 Cycle 13 irinotecan/panitumumab 01/05/2021 01/20/2021 CT chest-mixed response with minimal increase in size of some lesions and minimal decrease in the size of other lesions.  Overall number of lesions is unchanged. Cycle 14 irinotecan/Panitumumab 02/05/2021 Cycle 15 irinotecan/Panitumumab 02/23/2021 Cycle 16 irinotecan/panitumumab 03/16/2021 Cycle 17 irinotecan/Panitumumab 04/13/2021 05/03/2021-CT chest-enlargement of pulmonary metastases, no new lesions 06/21/2021-cycle 1 Lonsurf/bevacizumab 07/19/2021-cycle 2 Lonsurf/bevacizumab 08/16/2021-cycle 3 Lonsurf/bevacizumab 09/09/2021 CT chest-bilateral lung nodules and masses with mixed response, overall stable to very minimally increased 09/13/2021 cycle 4 Lonsurf/bevacizumab 10/12/2021 cycle 5 Lonsurf/bevacizumab 10/21/2021 chest CT-bilateral pulmonary metastases without significant change.  No new or progressive disease within the chest. 11/08/2021 cycle 6 Lonsurf/bevacizumab 12/06/2021 cycle 7 Lonsurf/bevacizumab 01/03/2022 cycle 8 Lonsurf/bevacizumab CTs 01/25/2022-index lung lesion stable to mildly increased in size.  Mild retroperitoneal adenopathy, mildly increased in size compared to 12/16/2020. 02/01/2022 cycle 9 Lonsurf/bevacizumab 02/28/2022 cycle 10 Lonsurf/bevacizumab 03/28/2022 cycle 11 Lonsurf/bevacizumab CTs 04/21/2022-no change in bilateral pulmonary metastases, 2 new hypoattenuating liver lesions, new enlarged portacaval node Cycle 1 fruquintinib 05/24/2022 Cycle 2 fruquintinib 06/21/2022 Cycle 3 fruquintinib 07/19/2022 CTs 08/12/2022-stable lung nodules, hilar and retroperitoneal nodes, slight increase in size of liver metastases, stable right adrenal nodule Cycle 4 fruquintinib 08/16/2022 Cycle 5 fruquintinib 09/13/2022 Cycle 6 fruquintinib 10/11/2022 CTs  10/22/2022-progressive mediastinal, pericardial, and periportal adenopathy, enlargement of liver lesions, stable pulmonary nodules Cycle 1 FOLFOX 11/14/2022 Cycle 2 FOLFOX 11/29/2022, oxaliplatin dose reduced due to thrombocytopenia Cycle 3 FOLFOX 12/27/2022 Cycle 4 FOLFOX 01/10/2023 Cycle 5 FOLFOX 01/24/2023   2.   Rectal bleeding and constipation secondary to #1   3.   History of peripheral vascular disease, status post left lower extremity vascular bypass surgery in April 2017   4.   History of nephrolithiasis   5.   History of Graves' disease treated with radioactive iodine   6.   Anxiety/depression   7.   Hypertension   8.   Hospitalization  01/19/2016 with wound dehiscence status post secondary suture closure of abdominal wall   9.   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 Hyperglycemia 06/20/2016-diagnosed with diabetes, maintained on insulin   11.  Positive COVID test 12/13/2018; positive COVID test 01/25/2021  12.  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  He has tolerated the FOLFOX well.  He will complete cycle 5 today.  He will return for an office visit and cycle 6 FOLFOX in 2 weeks.  He will be scheduled for restaging CTs after cycle 6. He will call for bleeding. Thornton Papas, MD  01/24/2023  9:10 AM

## 2023-01-24 NOTE — Patient Instructions (Signed)
[facility]   Discharge Instructions: Thank you for choosing [facility] to provide your oncology and hematology care.   If you have a lab appointment with the Cancer Center, please go directly to the Cancer Center and check in at the registration area.   Wear comfortable clothing and clothing appropriate for easy access to any Portacath or PICC line.   We strive to give you quality time with your provider. You may need to reschedule your appointment if you arrive late (15 or more minutes).  Arriving late affects you and other patients whose appointments are after yours.  Also, if you miss three or more appointments without notifying the office, you may be dismissed from the clinic at the provider's discretion.      For prescription refill requests, have your pharmacy contact our office and allow 72 hours for refills to be completed.    Today you received the following chemotherapy and/or immunotherapy agents Oxaliplatin (ELOXATIN), Leucovorin & Flourouracil (ADRUCIL).      To help prevent nausea and vomiting after your treatment, we encourage you to take your nausea medication as directed.  BELOW ARE SYMPTOMS THAT SHOULD BE REPORTED IMMEDIATELY: *FEVER GREATER THAN 100.4 F (38 C) OR HIGHER *CHILLS OR SWEATING *NAUSEA AND VOMITING THAT IS NOT CONTROLLED WITH YOUR NAUSEA MEDICATION *UNUSUAL SHORTNESS OF BREATH *UNUSUAL BRUISING OR BLEEDING *URINARY PROBLEMS (pain or burning when urinating, or frequent urination) *BOWEL PROBLEMS (unusual diarrhea, constipation, pain near the anus) TENDERNESS IN MOUTH AND THROAT WITH OR WITHOUT PRESENCE OF ULCERS (sore throat, sores in mouth, or a toothache) UNUSUAL RASH, SWELLING OR PAIN  UNUSUAL VAGINAL DISCHARGE OR ITCHING   Items with * indicate a potential emergency and should be followed up as soon as possible or go to the Emergency Department if any problems should occur.  Please show the  CHEMOTHERAPY ALERT CARD or IMMUNOTHERAPY ALERT CARD at check-in to the Emergency Department and triage nurse.  Should you have questions after your visit or need to cancel or reschedule your appointment, please contact [facility]  Dept: [phone]  and follow the prompts.  Office hours are 8:00 a.m. to 4:30 p.m. Monday - Friday. Please note that voicemails left after 4:00 p.m. may not be returned until the following business day.  We are closed weekends and major holidays. You have access to a nurse at all times for urgent questions. Please call the main number to the clinic Dept: [phone] and follow the prompts.   For any non-urgent questions, you may also contact your provider using MyChart. We now offer e-Visits for anyone 4 and older to request care online for non-urgent symptoms. For details visit [email or web address].   Also download the MyChart app! Go to the app store, search "MyChart", open the app, select , and log in with your MyChart username and password.  Oxaliplatin Injection What is this medication? OXALIPLATIN (ox AL i PLA tin) treats colorectal cancer. It works by slowing down the growth of cancer cells. This medicine may be used for other purposes; ask your health care provider or pharmacist if you have questions. COMMON BRAND NAME(S): Eloxatin What should I tell my care team before I take this medication? They need to know if you have any of these conditions: Heart disease History of irregular heartbeat or rhythm Liver disease Low blood cell levels (white cells, red cells, and platelets) Lung or breathing disease, such as asthma Take medications that treat  or prevent blood clots Tingling of the fingers, toes, or other nerve disorder An unusual or allergic reaction to oxaliplatin, other medications, foods, dyes, or preservatives If you or your partner are pregnant or trying to get pregnant Breast-feeding How should  I use this medication? This medication is injected into a vein. It is given by your care team in a hospital or clinic setting. Talk to your care team about the use of this medication in children. Special care may be needed. Overdosage: If you think you have taken too much of this medicine contact a poison control center or emergency room at once. NOTE: This medicine is only for you. Do not share this medicine with others. What if I miss a dose? Keep appointments for follow-up doses. It is important not to miss a dose. Call your care team if you are unable to keep an appointment. What may interact with this medication? Do not take this medication with any of the following: Cisapride Dronedarone Pimozide Thioridazine This medication may also interact with the following: Aspirin and aspirin-like medications Certain medications that treat or prevent blood clots, such as warfarin, apixaban, dabigatran, and rivaroxaban Cisplatin Cyclosporine Diuretics Medications for infection, such as acyclovir, adefovir, amphotericin B, bacitracin, cidofovir, foscarnet, ganciclovir, gentamicin, pentamidine, vancomycin NSAIDs, medications for pain and inflammation, such as ibuprofen or naproxen Other medications that cause heart rhythm changes Pamidronate Zoledronic acid This list may not describe all possible interactions. Give your health care provider a list of all the medicines, herbs, non-prescription drugs, or dietary supplements you use. Also tell them if you smoke, drink alcohol, or use illegal drugs. Some items may interact with your medicine. What should I watch for while using this medication? Your condition will be monitored carefully while you are receiving this medication. You may need blood work while taking this medication. This medication may make you feel generally unwell. This is not uncommon as chemotherapy can affect healthy cells as well as cancer cells. Report any side effects. Continue  your course of treatment even though you feel ill unless your care team tells you to stop. This medication may increase your risk of getting an infection. Call your care team for advice if you get a fever, chills, sore throat, or other symptoms of a cold or flu. Do not treat yourself. Try to avoid being around people who are sick. Avoid taking medications that contain aspirin, acetaminophen, ibuprofen, naproxen, or ketoprofen unless instructed by your care team. These medications may hide a fever. Be careful brushing or flossing your teeth or using a toothpick because you may get an infection or bleed more easily. If you have any dental work done, tell your dentist you are receiving this medication. This medication can make you more sensitive to cold. Do not drink cold drinks or use ice. Cover exposed skin before coming in contact with cold temperatures or cold objects. When out in cold weather wear warm clothing and cover your mouth and nose to warm the air that goes into your lungs. Tell your care team if you get sensitive to the cold. Talk to your care team if you or your partner are pregnant or think either of you might be pregnant. This medication can cause serious birth defects if taken during pregnancy and for 9 months after the last dose. A negative pregnancy test is required before starting this medication. A reliable form of contraception is recommended while taking this medication and for 9 months after the last dose. Talk to your care  team about effective forms of contraception. Do not father a child while taking this medication and for 6 months after the last dose. Use a condom while having sex during this time period. Do not breastfeed while taking this medication and for 3 months after the last dose. This medication may cause infertility. Talk to your care team if you are concerned about your fertility. What side effects may I notice from receiving this medication? Side effects that you  should report to your care team as soon as possible: Allergic reactions--skin rash, itching, hives, swelling of the face, lips, tongue, or throat Bleeding--bloody or black, tar-like stools, vomiting blood or brown material that looks like coffee grounds, red or dark brown urine, small red or purple spots on skin, unusual bruising or bleeding Dry cough, shortness of breath or trouble breathing Heart rhythm changes--fast or irregular heartbeat, dizziness, feeling faint or lightheaded, chest pain, trouble breathing Infection--fever, chills, cough, sore throat, wounds that don't heal, pain or trouble when passing urine, general feeling of discomfort or being unwell Liver injury--right upper belly pain, loss of appetite, nausea, light-colored stool, dark yellow or brown urine, yellowing skin or eyes, unusual weakness or fatigue Low red blood cell level--unusual weakness or fatigue, dizziness, headache, trouble breathing Muscle injury--unusual weakness or fatigue, muscle pain, dark yellow or brown urine, decrease in amount of urine Pain, tingling, or numbness in the hands or feet Sudden and severe headache, confusion, change in vision, seizures, which may be signs of posterior reversible encephalopathy syndrome (PRES) Unusual bruising or bleeding Side effects that usually do not require medical attention (report to your care team if they continue or are bothersome): Diarrhea Nausea Pain, redness, or swelling with sores inside the mouth or throat Unusual weakness or fatigue Vomiting This list may not describe all possible side effects. Call your doctor for medical advice about side effects. You may report side effects to FDA at 1-800-FDA-1088. Where should I keep my medication? This medication is given in a hospital or clinic. It will not be stored at home. NOTE: This sheet is a summary. It may not cover all possible information. If you have questions about this medicine, talk to your doctor,  pharmacist, or health care provider.  2024 Elsevier/Gold Standard (2022-02-15 00:00:00)   Leucovorin Injection What is this medication? LEUCOVORIN (loo koe VOR in) prevents side effects from certain medications, such as methotrexate. It works by increasing folate levels. This helps protect healthy cells in your body. It may also be used to treat anemia caused by low levels of folate. It can also be used with fluorouracil, a type of chemotherapy, to treat colorectal cancer. It works by increasing the effects of fluorouracil in the body. This medicine may be used for other purposes; ask your health care provider or pharmacist if you have questions. What should I tell my care team before I take this medication? They need to know if you have any of these conditions: Anemia from low levels of vitamin B12 in the blood An unusual or allergic reaction to leucovorin, folic acid, other medications, foods, dyes, or preservatives Pregnant or trying to get pregnant Breastfeeding How should I use this medication? This medication is injected into a vein or a muscle. It is given by your care team in a hospital or clinic setting. Talk to your care team about the use of this medication in children. Special care may be needed. Overdosage: If you think you have taken too much of this medicine contact a poison control  center or emergency room at once. NOTE: This medicine is only for you. Do not share this medicine with others. What if I miss a dose? Keep appointments for follow-up doses. It is important not to miss your dose. Call your care team if you are unable to keep an appointment. What may interact with this medication? Capecitabine Fluorouracil Phenobarbital Phenytoin Primidone Trimethoprim;sulfamethoxazole This list may not describe all possible interactions. Give your health care provider a list of all the medicines, herbs, non-prescription drugs, or dietary supplements you use. Also tell them if  you smoke, drink alcohol, or use illegal drugs. Some items may interact with your medicine. What should I watch for while using this medication? Your condition will be monitored carefully while you are receiving this medication. This medication may increase the side effects of 5-fluorouracil. Tell your care team if you have diarrhea or mouth sores that do not get better or that get worse. What side effects may I notice from receiving this medication? Side effects that you should report to your care team as soon as possible: Allergic reactions--skin rash, itching, hives, swelling of the face, lips, tongue, or throat This list may not describe all possible side effects. Call your doctor for medical advice about side effects. You may report side effects to FDA at 1-800-FDA-1088. Where should I keep my medication? This medication is given in a hospital or clinic. It will not be stored at home. NOTE: This sheet is a summary. It may not cover all possible information. If you have questions about this medicine, talk to your doctor, pharmacist, or health care provider.  2024 Elsevier/Gold Standard (2021-10-05 00:00:00)   Fluorouracil Injection What is this medication? FLUOROURACIL (flure oh YOOR a sil) treats some types of cancer. It works by slowing down the growth of cancer cells. This medicine may be used for other purposes; ask your health care provider or pharmacist if you have questions. COMMON BRAND NAME(S): Adrucil What should I tell my care team before I take this medication? They need to know if you have any of these conditions: Blood disorders Dihydropyrimidine dehydrogenase (DPD) deficiency Infection, such as chickenpox, cold sores, herpes Kidney disease Liver disease Poor nutrition Recent or ongoing radiation therapy An unusual or allergic reaction to fluorouracil, other medications, foods, dyes, or preservatives If you or your partner are pregnant or trying to get  pregnant Breast-feeding How should I use this medication? This medication is injected into a vein. It is administered by your care team in a hospital or clinic setting. Talk to your care team about the use of this medication in children. Special care may be needed. Overdosage: If you think you have taken too much of this medicine contact a poison control center or emergency room at once. NOTE: This medicine is only for you. Do not share this medicine with others. What if I miss a dose? Keep appointments for follow-up doses. It is important not to miss your dose. Call your care team if you are unable to keep an appointment. What may interact with this medication? Do not take this medication with any of the following: Live virus vaccines This medication may also interact with the following: Medications that treat or prevent blood clots, such as warfarin, enoxaparin, dalteparin This list may not describe all possible interactions. Give your health care provider a list of all the medicines, herbs, non-prescription drugs, or dietary supplements you use. Also tell them if you smoke, drink alcohol, or use illegal drugs. Some items may interact  with your medicine. What should I watch for while using this medication? Your condition will be monitored carefully while you are receiving this medication. This medication may make you feel generally unwell. This is not uncommon as chemotherapy can affect healthy cells as well as cancer cells. Report any side effects. Continue your course of treatment even though you feel ill unless your care team tells you to stop. In some cases, you may be given additional medications to help with side effects. Follow all directions for their use. This medication may increase your risk of getting an infection. Call your care team for advice if you get a fever, chills, sore throat, or other symptoms of a cold or flu. Do not treat yourself. Try to avoid being around people who are  sick. This medication may increase your risk to bruise or bleed. Call your care team if you notice any unusual bleeding. Be careful brushing or flossing your teeth or using a toothpick because you may get an infection or bleed more easily. If you have any dental work done, tell your dentist you are receiving this medication. Avoid taking medications that contain aspirin, acetaminophen, ibuprofen, naproxen, or ketoprofen unless instructed by your care team. These medications may hide a fever. Do not treat diarrhea with over the counter products. Contact your care team if you have diarrhea that lasts more than 2 days or if it is severe and watery. This medication can make you more sensitive to the sun. Keep out of the sun. If you cannot avoid being in the sun, wear protective clothing and sunscreen. Do not use sun lamps, tanning beds, or tanning booths. Talk to your care team if you or your partner wish to become pregnant or think you might be pregnant. This medication can cause serious birth defects if taken during pregnancy and for 3 months after the last dose. A reliable form of contraception is recommended while taking this medication and for 3 months after the last dose. Talk to your care team about effective forms of contraception. Do not father a child while taking this medication and for 3 months after the last dose. Use a condom while having sex during this time period. Do not breastfeed while taking this medication. This medication may cause infertility. Talk to your care team if you are concerned about your fertility. What side effects may I notice from receiving this medication? Side effects that you should report to your care team as soon as possible: Allergic reactions--skin rash, itching, hives, swelling of the face, lips, tongue, or throat Heart attack--pain or tightness in the chest, shoulders, arms, or jaw, nausea, shortness of breath, cold or clammy skin, feeling faint or  lightheaded Heart failure--shortness of breath, swelling of the ankles, feet, or hands, sudden weight gain, unusual weakness or fatigue Heart rhythm changes--fast or irregular heartbeat, dizziness, feeling faint or lightheaded, chest pain, trouble breathing High ammonia level--unusual weakness or fatigue, confusion, loss of appetite, nausea, vomiting, seizures Infection--fever, chills, cough, sore throat, wounds that don't heal, pain or trouble when passing urine, general feeling of discomfort or being unwell Low red blood cell level--unusual weakness or fatigue, dizziness, headache, trouble breathing Pain, tingling, or numbness in the hands or feet, muscle weakness, change in vision, confusion or trouble speaking, loss of balance or coordination, trouble walking, seizures Redness, swelling, and blistering of the skin over hands and feet Severe or prolonged diarrhea Unusual bruising or bleeding Side effects that usually do not require medical attention (report to your care  team if they continue or are bothersome): Dry skin Headache Increased tears Nausea Pain, redness, or swelling with sores inside the mouth or throat Sensitivity to light Vomiting This list may not describe all possible side effects. Call your doctor for medical advice about side effects. You may report side effects to FDA at 1-800-FDA-1088. Where should I keep my medication? This medication is given in a hospital or clinic. It will not be stored at home. NOTE: This sheet is a summary. It may not cover all possible information. If you have questions about this medicine, talk to your doctor, pharmacist, or health care provider.  2024 Elsevier/Gold Standard (2021-09-07 00:00:00)   The chemotherapy medication bag should finish at 46 hours, 96 hours, or 7 days. For example, if your pump is scheduled for 46 hours and it was put on at 4:00 p.m., it should finish at 2:00 p.m. the day it is scheduled to come off regardless of your  appointment time.     Estimated time to finish at 11:15 a.m. on Thursday 01/26/2023.   If the display on your pump reads "Low Volume" and it is beeping, take the batteries out of the pump and come to the cancer center for it to be taken off.   If the pump alarms go off prior to the pump reading "Low Volume" then call [phone] and someone can assist you.  If the plunger comes out and the chemotherapy medication is leaking out, please use your home chemo spill kit to clean up the spill. Do NOT use paper towels or other household products.  If you have problems or questions regarding your pump, please call either [phone] (24 hours a day) or the cancer center Monday-Friday 8:00 a.m.- 4:30 p.m. at the clinic number and we will assist you. If you are unable to get assistance, then go to the nearest Emergency Department and ask the staff to contact the IV team for assistance.

## 2023-01-26 ENCOUNTER — Inpatient Hospital Stay: Payer: Medicaid Other

## 2023-01-26 VITALS — BP 126/82 | HR 109 | Temp 98.1°F | Resp 18

## 2023-01-26 DIAGNOSIS — C187 Malignant neoplasm of sigmoid colon: Secondary | ICD-10-CM

## 2023-01-26 DIAGNOSIS — Z5111 Encounter for antineoplastic chemotherapy: Secondary | ICD-10-CM | POA: Diagnosis not present

## 2023-01-26 MED ORDER — SODIUM CHLORIDE 0.9% FLUSH
10.0000 mL | INTRAVENOUS | Status: DC | PRN
  Administered 2023-01-26: 10 mL

## 2023-01-26 MED ORDER — HEPARIN SOD (PORK) LOCK FLUSH 100 UNIT/ML IV SOLN
500.0000 [IU] | Freq: Once | INTRAVENOUS | Status: AC | PRN
  Administered 2023-01-26: 500 [IU]

## 2023-01-31 ENCOUNTER — Telehealth: Payer: Self-pay | Admitting: *Deleted

## 2023-01-31 NOTE — Telephone Encounter (Signed)
Notified Mrs. Devereux of CT appointment on 10/1 at [facility]. She is aware of prep and time of port access. Message also sent via MyChart.

## 2023-02-01 ENCOUNTER — Other Ambulatory Visit: Payer: Self-pay | Admitting: Oncology

## 2023-02-01 DIAGNOSIS — C187 Malignant neoplasm of sigmoid colon: Secondary | ICD-10-CM

## 2023-02-02 ENCOUNTER — Other Ambulatory Visit: Payer: Self-pay

## 2023-02-02 NOTE — Patient Outreach (Signed)
First telephone outreach attempt to obtain mRS. No answer. Left message for returned call.  Philmore Pali [facility] Management Assistant [phone]

## 2023-02-06 ENCOUNTER — Other Ambulatory Visit: Payer: Self-pay

## 2023-02-06 NOTE — Patient Outreach (Signed)
Second telephone outreach attempt to obtain mRS. No answer. Left message for returned call.  Placida Cambre [facility] Management Assistant [phone]  

## 2023-02-07 ENCOUNTER — Inpatient Hospital Stay: Payer: Medicaid Other | Admitting: Nurse Practitioner

## 2023-02-07 ENCOUNTER — Inpatient Hospital Stay: Payer: Medicaid Other

## 2023-02-07 ENCOUNTER — Encounter: Payer: Self-pay | Admitting: Nurse Practitioner

## 2023-02-07 ENCOUNTER — Other Ambulatory Visit: Payer: Self-pay

## 2023-02-07 VITALS — BP 140/91 | HR 99 | Resp 18

## 2023-02-07 VITALS — BP 138/79 | HR 100 | Temp 98.2°F | Resp 18 | Ht 70.0 in | Wt 206.5 lb

## 2023-02-07 DIAGNOSIS — Z5111 Encounter for antineoplastic chemotherapy: Secondary | ICD-10-CM | POA: Diagnosis not present

## 2023-02-07 DIAGNOSIS — C187 Malignant neoplasm of sigmoid colon: Secondary | ICD-10-CM

## 2023-02-07 LAB — CBC WITH DIFFERENTIAL (CANCER CENTER ONLY)
Abs Immature Granulocytes: 0.02 10*3/uL (ref 0.00–0.07)
Basophils Absolute: 0 10*3/uL (ref 0.0–0.1)
Basophils Relative: 0 %
Eosinophils Absolute: 0.1 10*3/uL (ref 0.0–0.5)
Eosinophils Relative: 1 %
HCT: 30.8 % — ABNORMAL LOW (ref 39.0–52.0)
Hemoglobin: 9.6 g/dL — ABNORMAL LOW (ref 13.0–17.0)
Immature Granulocytes: 0 %
Lymphocytes Relative: 34 %
Lymphs Abs: 2 10*3/uL (ref 0.7–4.0)
MCH: 27.2 pg (ref 26.0–34.0)
MCHC: 31.2 g/dL (ref 30.0–36.0)
MCV: 87.3 fL (ref 80.0–100.0)
Monocytes Absolute: 0.5 10*3/uL (ref 0.1–1.0)
Monocytes Relative: 9 %
Neutro Abs: 3.3 10*3/uL (ref 1.7–7.7)
Neutrophils Relative %: 56 %
Platelet Count: 87 10*3/uL — ABNORMAL LOW (ref 150–400)
RBC: 3.53 MIL/uL — ABNORMAL LOW (ref 4.22–5.81)
RDW: 16.4 % — ABNORMAL HIGH (ref 11.5–15.5)
WBC Count: 5.9 10*3/uL (ref 4.0–10.5)
nRBC: 0 % (ref 0.0–0.2)

## 2023-02-07 LAB — CMP (CANCER CENTER ONLY)
ALT: 31 U/L (ref 0–44)
AST: 41 U/L (ref 15–41)
Albumin: 3.6 g/dL (ref 3.5–5.0)
Alkaline Phosphatase: 117 U/L (ref 38–126)
Anion gap: 10 (ref 5–15)
BUN: 11 mg/dL (ref 6–20)
CO2: 25 mmol/L (ref 22–32)
Calcium: 8.8 mg/dL — ABNORMAL LOW (ref 8.9–10.3)
Chloride: 98 mmol/L (ref 98–111)
Creatinine: 1.31 mg/dL — ABNORMAL HIGH (ref 0.61–1.24)
GFR, Estimated: >60 mL/min (ref >60)
Glucose, Bld: 202 mg/dL — ABNORMAL HIGH (ref 70–99)
Potassium: 3.4 mmol/L — ABNORMAL LOW (ref 3.5–5.1)
Sodium: 133 mmol/L — ABNORMAL LOW (ref 135–145)
Total Bilirubin: 0.4 mg/dL (ref 0.3–1.2)
Total Protein: 7.6 g/dL (ref 6.5–8.1)

## 2023-02-07 LAB — CEA (ACCESS): CEA (CHCC): 86.34 ng/mL — ABNORMAL HIGH (ref 0.00–5.00)

## 2023-02-07 MED ORDER — DEXTROSE 5 % IV SOLN: Freq: Once | INTRAVENOUS | Status: AC

## 2023-02-07 MED ORDER — LEUCOVORIN CALCIUM INJECTION 350 MG
400.0000 mg/m2 | Freq: Once | INTRAVENOUS | Status: AC
  Administered 2023-02-07: 872 mg via INTRAVENOUS
  Filled 2023-02-07: qty 43.6

## 2023-02-07 MED ORDER — MIRTAZAPINE 15 MG PO TABS: 15.0000 mg | ORAL_TABLET | Freq: Every day | ORAL | 1 refills | Status: DC

## 2023-02-07 MED ORDER — FAMOTIDINE IN NACL 20-0.9 MG/50ML-% IV SOLN
20.0000 mg | Freq: Once | INTRAVENOUS | Status: AC
  Administered 2023-02-07: 20 mg via INTRAVENOUS
  Filled 2023-02-07: qty 50

## 2023-02-07 MED ORDER — PALONOSETRON HCL INJECTION 0.25 MG/5ML
0.2500 mg | Freq: Once | INTRAVENOUS | Status: AC
  Administered 2023-02-07: 0.25 mg via INTRAVENOUS
  Filled 2023-02-07: qty 5

## 2023-02-07 MED ORDER — OXALIPLATIN CHEMO INJECTION 100 MG/20ML
50.0000 mg/m2 | Freq: Once | INTRAVENOUS | Status: AC
  Administered 2023-02-07: 100 mg via INTRAVENOUS
  Filled 2023-02-07: qty 20

## 2023-02-07 MED ORDER — SODIUM CHLORIDE 0.9 % IV SOLN
10.0000 mg | Freq: Once | INTRAVENOUS | Status: AC
  Administered 2023-02-07: 10 mg via INTRAVENOUS
  Filled 2023-02-07: qty 10

## 2023-02-07 MED ORDER — DIPHENHYDRAMINE HCL 50 MG/ML IJ SOLN
25.0000 mg | Freq: Once | INTRAMUSCULAR | Status: AC
  Administered 2023-02-07: 25 mg via INTRAVENOUS
  Filled 2023-02-07: qty 1

## 2023-02-07 MED ORDER — SODIUM CHLORIDE 0.9 % IV SOLN
2000.0000 mg/m2 | INTRAVENOUS | Status: DC
  Administered 2023-02-07: 4350 mg via INTRAVENOUS
  Filled 2023-02-07: qty 87

## 2023-02-07 MED ORDER — FLUOROURACIL CHEMO INJECTION 2.5 GM/50ML
400.0000 mg/m2 | Freq: Once | INTRAVENOUS | Status: AC
  Administered 2023-02-07: 850 mg via INTRAVENOUS
  Filled 2023-02-07: qty 17

## 2023-02-07 NOTE — Patient Outreach (Signed)
Patient wife returned Telephone outreach to patient to obtain mRS was successfully completed. MRS= 0  Vanice Sarah [facility] Care Management Assistant [phone]

## 2023-02-07 NOTE — Progress Notes (Signed)
Jeff [name] OFFICE PROGRESS NOTE   Diagnosis: Colon cancer  INTERVAL HISTORY:   Jeff [name] returns as scheduled.  He completed cycle 5 FOLFOX 01/24/2023.  He denies nausea/vomiting.  No mouth sores.  No diarrhea.  Stable numbness to fingers left hand, predated current chemotherapy.  He attributes the numbness to a stroke.  Cold sensitivity lasted about 3 to 4 days.  Wife notes cough is better.  Objective:  Vital signs in last 24 hours:  Blood pressure 138/79, pulse 100, temperature 98.2 F (36.8 C), temperature source Temporal, resp. rate 18, height 5\' 10"  (1.778 m), weight 206 lb 8 oz (93.7 kg), SpO2 100%.    HEENT: No thrush or ulcers. Resp: Lungs clear bilaterally. Cardio: Regular rate and rhythm. GI: No hepatosplenomegaly.  Nontender. Vascular: No leg edema. Neuro: Mild decrease in vibratory sense over the fingertips bilaterally. Skin: Palms without erythema. Port-A-Cath without erythema.  Lab Results:  Lab Results  Component Value Date   WBC 5.9 02/07/2023   HGB 9.6 (L) 02/07/2023   HCT 30.8 (L) 02/07/2023   MCV 87.3 02/07/2023   PLT 87 (L) 02/07/2023   NEUTROABS 3.3 02/07/2023    Imaging:  No results found.  Medications: I have reviewed the patient's current medications.  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, biopsy confirmed adenocarcinoma         CTs chest, abdomen, and pelvis on 12/11/2015-indeterminate tiny pulmonary nodules, multiple liver metastases, small nodes in the sigmoid mesocolon Laparoscopic sigmoid colectomy, liver biopsy, Port-A-Cath placement 01/14/2016 Pathology sigmoid colon resection- colonic adenocarcinoma, 5.3 cm extending into pericolonic connective tissue, positive lymph-vascular invasion, positive perineural invasion, negative margins, metastatic carcinoma in 9 of 28 lymph nodes Pathology liver biopsy-metastatic colorectal adenocarcinoma MSI stable;  mismatch repair protein normal APC alteration, K RAS wild-type, no BRAF mutation Cycle 1 FOLFOX 02/02/2016 Cycle 2 FOLFOX 02/15/2016 Cycle 3 FOLFOX 02/29/2016 Cycle 4 FOLFOX 03/14/2016 Cycle 5 FOLFOX 03/28/2016 Cycle 6 FOLFOX 04/11/2016 (oxaliplatin held secondary to thrombocytopenia) 04/21/2016 restaging CTs-difficulty evaluating liver lesions due to hepatic steatosis. Stable right adrenal nodule. No adenopathy or local recurrence near the rectosigmoid anastomotic site. Cycle 7 FOLFOX 04/25/2016 MRI liver 05/02/2016-partial improvement in hepatic metastases Cycle 8 FOLFOX 05/10/2016 Cycle 9 FOLFOX 05/23/2016 (oxaliplatin held due to thrombocytopenia) Cycle 10 FOLFOX 06/06/2016 (oxaliplatin dose reduced due to thrombocytopenia) Cycle 11 FOLFOX 06/27/2016 (oxaliplatin held due to neuropathy) Cycle 12 FOLFOX 07/11/2016 (oxaliplatin held) Initiation of maintenance Xeloda 7 days on/7 days off 07/27/2016 MRI liver 11/18/2016-decrease in hepatic metastatic disease. No new or progressive disease identified within the abdomen. Continuation of Xeloda 7 days on/7 days off MRI liver 04/27/2017-previous liver lesions not identified, no new lesions, no lymphadenopathy Xeloda continued 7 days on/7 days off MRI liver 12/04/2017 - no evidence of metastatic disease, hepatic steatosis Xeloda continued 7 days on/7 days off MRI liver 07/15/2018- no evidence of metastatic disease.  Stable severe hepatic steatosis. Xeloda continued 7 days on/7 days off MRI liver 03/16/2019-hepatic steatosis, no liver mass, focal area of intrahepatic biliary dilatation in segments 2 and 3 of the left lobe-increased Xeloda continued 7 days on/7 days off MRI abdomen 08/19/2019-no findings to suggest liver metastases.  Bilateral lung nodules measuring up to 2.8 cm, progressive and more conspicuous than on previous exam CT chest 08/29/2019-multiple pulmonary metastases, new from 04/21/2016 Cycle 1 FOLFIRI/bevacizumab 09/09/2019 Cycle 2  FOLFIRI/bevacizumab 09/26/2019  Cycle 3 FOLFIRI/bevacizumab 10/10/2019 Cycle 4 FOLFIRI/bevacizumab 10/24/2019 Cycle 5 FOLFIRI/bevacizumab 11/07/2019 CT chest 11/14/2019-decreased size of lung  nodules, no new lesions, hepatic steatosis Cycle 6 FOLFIRI/bevacizumab 11/21/2019 Cycle 7 FOLFIRI/bevacizumab 12/05/2019 Cycle 8 FOLFIRI/bevacizumab 12/19/2019 Cycle 9 FOLFIRI/bevacizumab 01/02/2020 Cycle 10 FOLFIRI/bevacizumab 01/16/2020 CT chest 01/29/2020-stable bilateral pulmonary metastases.  No new or progressive metastatic disease in the chest. Cycle 11 FOLFIRI/bevacizumab 01/30/2020 Cycle 12 FOLFIRI/bevacizumab 02/19/2020 Cycle 13 FOLFIRI/bevacizumab 03/12/2020 Cycle 14 FOLFIRI/bevacizumab 04/02/2020 Cycle 15 FOLFIRI/bevacizumab 04/23/2020 Cycle 16 FOLFIRI/bevacizumab 05/21/2020 CT chest 06/09/2020-mild progression pulmonary metastasis.  Some lesions have increased in size while others are similar. Cycle 1 irinotecan/Panitumumab 06/18/2020 Cycle 2 irinotecan/Panitumumab 07/02/2020 Cycle 3 irinotecan/panitumumab 07/16/2020 Cycle 4 irinotecan/Panitumumab 07/30/2020 Cycle 5 irinotecan/Panitumumab 08/13/2020, Emend added Cycle 6 irinotecan/Panitumumab 08/27/2020 CT chest 09/08/2020-decreased size of pulmonary nodules, no progressive disease Cycle 7 irinotecan/panitumumab 09/02/2020 Cycle 8 irinotecan/panitumumab 09/24/2020 Cycle 9 irinotecan/Panitumumab 10/08/2020 10/22/2020 treatment held due to left foot fracture, need for surgery Cycle 10 irinotecan/panitumumab 11/24/2020 Cycle 11 irinotecan/Panitumumab 12/08/2020 Cycle 12 irinotecan/panitumumab 12/22/2020 Cycle 13 irinotecan/panitumumab 01/05/2021 01/20/2021 CT chest-mixed response with minimal increase in size of some lesions and minimal decrease in the size of other lesions.  Overall number of lesions is unchanged. Cycle 14 irinotecan/Panitumumab 02/05/2021 Cycle 15 irinotecan/Panitumumab 02/23/2021 Cycle 16 irinotecan/panitumumab 03/16/2021 Cycle 17 irinotecan/Panitumumab  04/13/2021 05/03/2021-CT chest-enlargement of pulmonary metastases, no new lesions 06/21/2021-cycle 1 Lonsurf/bevacizumab 07/19/2021-cycle 2 Lonsurf/bevacizumab 08/16/2021-cycle 3 Lonsurf/bevacizumab 09/09/2021 CT chest-bilateral lung nodules and masses with mixed response, overall stable to very minimally increased 09/13/2021 cycle 4 Lonsurf/bevacizumab 10/12/2021 cycle 5 Lonsurf/bevacizumab 10/21/2021 chest CT-bilateral pulmonary metastases without significant change.  No new or progressive disease within the chest. 11/08/2021 cycle 6 Lonsurf/bevacizumab 12/06/2021 cycle 7 Lonsurf/bevacizumab 01/03/2022 cycle 8 Lonsurf/bevacizumab CTs 01/25/2022-index lung lesion stable to mildly increased in size.  Mild retroperitoneal adenopathy, mildly increased in size compared to 12/16/2020. 02/01/2022 cycle 9 Lonsurf/bevacizumab 02/28/2022 cycle 10 Lonsurf/bevacizumab 03/28/2022 cycle 11 Lonsurf/bevacizumab CTs 04/21/2022-no change in bilateral pulmonary metastases, 2 new hypoattenuating liver lesions, new enlarged portacaval node Cycle 1 fruquintinib 05/24/2022 Cycle 2 fruquintinib 06/21/2022 Cycle 3 fruquintinib 07/19/2022 CTs 08/12/2022-stable lung nodules, hilar and retroperitoneal nodes, slight increase in size of liver metastases, stable right adrenal nodule Cycle 4 fruquintinib 08/16/2022 Cycle 5 fruquintinib 09/13/2022 Cycle 6 fruquintinib 10/11/2022 CTs 10/22/2022-progressive mediastinal, pericardial, and periportal adenopathy, enlargement of liver lesions, stable pulmonary nodules Cycle 1 FOLFOX 11/14/2022 Cycle 2 FOLFOX 11/29/2022, oxaliplatin dose reduced due to thrombocytopenia Cycle 3 FOLFOX 12/27/2022 Cycle 4 FOLFOX 01/10/2023 Cycle 5 FOLFOX 01/24/2023 Cycle 6 FOLFOX 02/07/2023   2.   Rectal bleeding and constipation secondary to #1   3.   History of peripheral vascular disease, status post left lower extremity vascular bypass surgery in April 2017   4.   History of nephrolithiasis   5.   History of Graves'  disease treated with radioactive iodine   6.   Anxiety/depression   7.   Hypertension   8.   Hospitalization 01/19/2016 with wound dehiscence status post secondary suture closure of abdominal wall   9.   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 Hyperglycemia 06/20/2016-diagnosed with diabetes, maintained on insulin   11.  Positive COVID test 12/13/2018; positive COVID test 01/25/2021   12.  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  He has completed 5 cycles of FOLFOX.  He continues to tolerate treatment well.  There is no clinical evidence of disease progression.  CEA is lower.  Plan to proceed with cycle 6 today as scheduled.  Restaging CTs prior to next office visit.  CBC and chemistry panel reviewed.  Labs adequate to proceed as above.  Stable mild to moderate thrombocytopenia.  He will contact the office with bleeding.  He will return for follow-up and treatment in 2 weeks.  We are available to see him sooner if needed.    Lonna Cobb ANP/GNP-BC   02/07/2023  8:58 AM

## 2023-02-07 NOTE — Patient Outreach (Signed)
3 outreach attempts were completed to obtain mRs. mRs could not be obtained because patient never returned my calls. mRs=7    [place] Management Assistant [phone]

## 2023-02-07 NOTE — Patient Instructions (Signed)
[place] [facility]   Discharge Instructions: Thank you for choosing [facility] to provide your oncology and hematology care.   If you have a lab appointment with the Cancer Center, please go directly to the Cancer Center and check in at the registration area.   Wear comfortable clothing and clothing appropriate for easy access to any Portacath or PICC line.   We strive to give you quality time with your provider. You may need to reschedule your appointment if you arrive late (15 or more minutes).  Arriving late affects you and other patients whose appointments are after yours.  Also, if you miss three or more appointments without notifying the office, you may be dismissed from the clinic at the provider's discretion.      For prescription refill requests, have your pharmacy contact our office and allow 72 hours for refills to be completed.    Today you received the following chemotherapy and/or immunotherapy agents Oxaliplatin (ELOXATIN), Leucovorin & Flourouracil (ADRUCIL).      To help prevent nausea and vomiting after your treatment, we encourage you to take your nausea medication as directed.  BELOW ARE SYMPTOMS THAT SHOULD BE REPORTED IMMEDIATELY: *FEVER GREATER THAN 100.4 F (38 C) OR HIGHER *CHILLS OR SWEATING *NAUSEA AND VOMITING THAT IS NOT CONTROLLED WITH YOUR NAUSEA MEDICATION *UNUSUAL SHORTNESS OF BREATH *UNUSUAL BRUISING OR BLEEDING *URINARY PROBLEMS (pain or burning when urinating, or frequent urination) *BOWEL PROBLEMS (unusual diarrhea, constipation, pain near the anus) TENDERNESS IN MOUTH AND THROAT WITH OR WITHOUT PRESENCE OF ULCERS (sore throat, sores in mouth, or a toothache) UNUSUAL RASH, SWELLING OR PAIN  UNUSUAL VAGINAL DISCHARGE OR ITCHING   Items with * indicate a potential emergency and should be followed up as soon as possible or go to the Emergency Department if any problems should occur.  Please show the  CHEMOTHERAPY ALERT CARD or IMMUNOTHERAPY ALERT CARD at check-in to the Emergency Department and triage nurse.  Should you have questions after your visit or need to cancel or reschedule your appointment, please contact [facility]  Dept: [phone]  and follow the prompts.  Office hours are 8:00 a.m. to 4:30 p.m. Monday - Friday. Please note that voicemails left after 4:00 p.m. may not be returned until the following business day.  We are closed weekends and major holidays. You have access to a nurse at all times for urgent questions. Please call the main number to the clinic Dept: [phone] and follow the prompts.   For any non-urgent questions, you may also contact your provider using MyChart. We now offer e-Visits for anyone 67 and older to request care online for non-urgent symptoms. For details visit [email or web address].   Also download the MyChart app! Go to the app store, search "MyChart", open the app, select [place], and log in with your MyChart username and password.  Oxaliplatin Injection What is this medication? OXALIPLATIN (ox AL i PLA tin) treats colorectal cancer. It works by slowing down the growth of cancer cells. This medicine may be used for other purposes; ask your health care provider or pharmacist if you have questions. COMMON BRAND NAME(S): Eloxatin What should I tell my care team before I take this medication? They need to know if you have any of these conditions: Heart disease History of irregular heartbeat or rhythm Liver disease Low blood cell levels (white cells, red cells, and platelets) Lung or breathing disease, such as asthma Take medications that treat  or prevent blood clots Tingling of the fingers, toes, or other nerve disorder An unusual or allergic reaction to oxaliplatin, other medications, foods, dyes, or preservatives If you or your partner are pregnant or trying to get pregnant Breast-feeding How should  I use this medication? This medication is injected into a vein. It is given by your care team in a hospital or clinic setting. Talk to your care team about the use of this medication in children. Special care may be needed. Overdosage: If you think you have taken too much of this medicine contact a poison control center or emergency room at once. NOTE: This medicine is only for you. Do not share this medicine with others. What if I miss a dose? Keep appointments for follow-up doses. It is important not to miss a dose. Call your care team if you are unable to keep an appointment. What may interact with this medication? Do not take this medication with any of the following: Cisapride Dronedarone Pimozide Thioridazine This medication may also interact with the following: Aspirin and aspirin-like medications Certain medications that treat or prevent blood clots, such as warfarin, apixaban, dabigatran, and rivaroxaban Cisplatin Cyclosporine Diuretics Medications for infection, such as acyclovir, adefovir, amphotericin B, bacitracin, cidofovir, foscarnet, ganciclovir, gentamicin, pentamidine, vancomycin NSAIDs, medications for pain and inflammation, such as ibuprofen or naproxen Other medications that cause heart rhythm changes Pamidronate Zoledronic acid This list may not describe all possible interactions. Give your health care provider a list of all the medicines, herbs, non-prescription drugs, or dietary supplements you use. Also tell them if you smoke, drink alcohol, or use illegal drugs. Some items may interact with your medicine. What should I watch for while using this medication? Your condition will be monitored carefully while you are receiving this medication. You may need blood work while taking this medication. This medication may make you feel generally unwell. This is not uncommon as chemotherapy can affect healthy cells as well as cancer cells. Report any side effects. Continue  your course of treatment even though you feel ill unless your care team tells you to stop. This medication may increase your risk of getting an infection. Call your care team for advice if you get a fever, chills, sore throat, or other symptoms of a cold or flu. Do not treat yourself. Try to avoid being around people who are sick. Avoid taking medications that contain aspirin, acetaminophen, ibuprofen, naproxen, or ketoprofen unless instructed by your care team. These medications may hide a fever. Be careful brushing or flossing your teeth or using a toothpick because you may get an infection or bleed more easily. If you have any dental work done, tell your dentist you are receiving this medication. This medication can make you more sensitive to cold. Do not drink cold drinks or use ice. Cover exposed skin before coming in contact with cold temperatures or cold objects. When out in cold weather wear warm clothing and cover your mouth and nose to warm the air that goes into your lungs. Tell your care team if you get sensitive to the cold. Talk to your care team if you or your partner are pregnant or think either of you might be pregnant. This medication can cause serious birth defects if taken during pregnancy and for 9 months after the last dose. A negative pregnancy test is required before starting this medication. A reliable form of contraception is recommended while taking this medication and for 9 months after the last dose. Talk to your care  team about effective forms of contraception. Do not father a child while taking this medication and for 6 months after the last dose. Use a condom while having sex during this time period. Do not breastfeed while taking this medication and for 3 months after the last dose. This medication may cause infertility. Talk to your care team if you are concerned about your fertility. What side effects may I notice from receiving this medication? Side effects that you  should report to your care team as soon as possible: Allergic reactions--skin rash, itching, hives, swelling of the face, lips, tongue, or throat Bleeding--bloody or black, tar-like stools, vomiting blood or brown material that looks like coffee grounds, red or dark brown urine, small red or purple spots on skin, unusual bruising or bleeding Dry cough, shortness of breath or trouble breathing Heart rhythm changes--fast or irregular heartbeat, dizziness, feeling faint or lightheaded, chest pain, trouble breathing Infection--fever, chills, cough, sore throat, wounds that don't heal, pain or trouble when passing urine, general feeling of discomfort or being unwell Liver injury--right upper belly pain, loss of appetite, nausea, light-colored stool, dark yellow or brown urine, yellowing skin or eyes, unusual weakness or fatigue Low red blood cell level--unusual weakness or fatigue, dizziness, headache, trouble breathing Muscle injury--unusual weakness or fatigue, muscle pain, dark yellow or brown urine, decrease in amount of urine Pain, tingling, or numbness in the hands or feet Sudden and severe headache, confusion, change in vision, seizures, which may be signs of posterior reversible encephalopathy syndrome (PRES) Unusual bruising or bleeding Side effects that usually do not require medical attention (report to your care team if they continue or are bothersome): Diarrhea Nausea Pain, redness, or swelling with sores inside the mouth or throat Unusual weakness or fatigue Vomiting This list may not describe all possible side effects. Call your doctor for medical advice about side effects. You may report side effects to FDA at 1-800-FDA-1088. Where should I keep my medication? This medication is given in a hospital or clinic. It will not be stored at home. NOTE: This sheet is a summary. It may not cover all possible information. If you have questions about this medicine, talk to your doctor,  pharmacist, or health care provider.  2024 Elsevier/Gold Standard (2022-02-15 00:00:00)   Leucovorin Injection What is this medication? LEUCOVORIN (loo koe VOR in) prevents side effects from certain medications, such as methotrexate. It works by increasing folate levels. This helps protect healthy cells in your body. It may also be used to treat anemia caused by low levels of folate. It can also be used with fluorouracil, a type of chemotherapy, to treat colorectal cancer. It works by increasing the effects of fluorouracil in the body. This medicine may be used for other purposes; ask your health care provider or pharmacist if you have questions. What should I tell my care team before I take this medication? They need to know if you have any of these conditions: Anemia from low levels of vitamin B12 in the blood An unusual or allergic reaction to leucovorin, folic acid, other medications, foods, dyes, or preservatives Pregnant or trying to get pregnant Breastfeeding How should I use this medication? This medication is injected into a vein or a muscle. It is given by your care team in a hospital or clinic setting. Talk to your care team about the use of this medication in children. Special care may be needed. Overdosage: If you think you have taken too much of this medicine contact a poison control  center or emergency room at once. NOTE: This medicine is only for you. Do not share this medicine with others. What if I miss a dose? Keep appointments for follow-up doses. It is important not to miss your dose. Call your care team if you are unable to keep an appointment. What may interact with this medication? Capecitabine Fluorouracil Phenobarbital Phenytoin Primidone Trimethoprim;sulfamethoxazole This list may not describe all possible interactions. Give your health care provider a list of all the medicines, herbs, non-prescription drugs, or dietary supplements you use. Also tell them if  you smoke, drink alcohol, or use illegal drugs. Some items may interact with your medicine. What should I watch for while using this medication? Your condition will be monitored carefully while you are receiving this medication. This medication may increase the side effects of 5-fluorouracil. Tell your care team if you have diarrhea or mouth sores that do not get better or that get worse. What side effects may I notice from receiving this medication? Side effects that you should report to your care team as soon as possible: Allergic reactions--skin rash, itching, hives, swelling of the face, lips, tongue, or throat This list may not describe all possible side effects. Call your doctor for medical advice about side effects. You may report side effects to FDA at 1-800-FDA-1088. Where should I keep my medication? This medication is given in a hospital or clinic. It will not be stored at home. NOTE: This sheet is a summary. It may not cover all possible information. If you have questions about this medicine, talk to your doctor, pharmacist, or health care provider.  2024 Elsevier/Gold Standard (2021-10-05 00:00:00)   Fluorouracil Injection What is this medication? FLUOROURACIL (flure oh YOOR a sil) treats some types of cancer. It works by slowing down the growth of cancer cells. This medicine may be used for other purposes; ask your health care provider or pharmacist if you have questions. COMMON BRAND NAME(S): Adrucil What should I tell my care team before I take this medication? They need to know if you have any of these conditions: Blood disorders Dihydropyrimidine dehydrogenase (DPD) deficiency Infection, such as chickenpox, cold sores, herpes Kidney disease Liver disease Poor nutrition Recent or ongoing radiation therapy An unusual or allergic reaction to fluorouracil, other medications, foods, dyes, or preservatives If you or your partner are pregnant or trying to get  pregnant Breast-feeding How should I use this medication? This medication is injected into a vein. It is administered by your care team in a hospital or clinic setting. Talk to your care team about the use of this medication in children. Special care may be needed. Overdosage: If you think you have taken too much of this medicine contact a poison control center or emergency room at once. NOTE: This medicine is only for you. Do not share this medicine with others. What if I miss a dose? Keep appointments for follow-up doses. It is important not to miss your dose. Call your care team if you are unable to keep an appointment. What may interact with this medication? Do not take this medication with any of the following: Live virus vaccines This medication may also interact with the following: Medications that treat or prevent blood clots, such as warfarin, enoxaparin, dalteparin This list may not describe all possible interactions. Give your health care provider a list of all the medicines, herbs, non-prescription drugs, or dietary supplements you use. Also tell them if you smoke, drink alcohol, or use illegal drugs. Some items may interact  with your medicine. What should I watch for while using this medication? Your condition will be monitored carefully while you are receiving this medication. This medication may make you feel generally unwell. This is not uncommon as chemotherapy can affect healthy cells as well as cancer cells. Report any side effects. Continue your course of treatment even though you feel ill unless your care team tells you to stop. In some cases, you may be given additional medications to help with side effects. Follow all directions for their use. This medication may increase your risk of getting an infection. Call your care team for advice if you get a fever, chills, sore throat, or other symptoms of a cold or flu. Do not treat yourself. Try to avoid being around people who are  sick. This medication may increase your risk to bruise or bleed. Call your care team if you notice any unusual bleeding. Be careful brushing or flossing your teeth or using a toothpick because you may get an infection or bleed more easily. If you have any dental work done, tell your dentist you are receiving this medication. Avoid taking medications that contain aspirin, acetaminophen, ibuprofen, naproxen, or ketoprofen unless instructed by your care team. These medications may hide a fever. Do not treat diarrhea with over the counter products. Contact your care team if you have diarrhea that lasts more than 2 days or if it is severe and watery. This medication can make you more sensitive to the sun. Keep out of the sun. If you cannot avoid being in the sun, wear protective clothing and sunscreen. Do not use sun lamps, tanning beds, or tanning booths. Talk to your care team if you or your partner wish to become pregnant or think you might be pregnant. This medication can cause serious birth defects if taken during pregnancy and for 3 months after the last dose. A reliable form of contraception is recommended while taking this medication and for 3 months after the last dose. Talk to your care team about effective forms of contraception. Do not father a child while taking this medication and for 3 months after the last dose. Use a condom while having sex during this time period. Do not breastfeed while taking this medication. This medication may cause infertility. Talk to your care team if you are concerned about your fertility. What side effects may I notice from receiving this medication? Side effects that you should report to your care team as soon as possible: Allergic reactions--skin rash, itching, hives, swelling of the face, lips, tongue, or throat Heart attack--pain or tightness in the chest, shoulders, arms, or jaw, nausea, shortness of breath, cold or clammy skin, feeling faint or  lightheaded Heart failure--shortness of breath, swelling of the ankles, feet, or hands, sudden weight gain, unusual weakness or fatigue Heart rhythm changes--fast or irregular heartbeat, dizziness, feeling faint or lightheaded, chest pain, trouble breathing High ammonia level--unusual weakness or fatigue, confusion, loss of appetite, nausea, vomiting, seizures Infection--fever, chills, cough, sore throat, wounds that don't heal, pain or trouble when passing urine, general feeling of discomfort or being unwell Low red blood cell level--unusual weakness or fatigue, dizziness, headache, trouble breathing Pain, tingling, or numbness in the hands or feet, muscle weakness, change in vision, confusion or trouble speaking, loss of balance or coordination, trouble walking, seizures Redness, swelling, and blistering of the skin over hands and feet Severe or prolonged diarrhea Unusual bruising or bleeding Side effects that usually do not require medical attention (report to your care  team if they continue or are bothersome): Dry skin Headache Increased tears Nausea Pain, redness, or swelling with sores inside the mouth or throat Sensitivity to light Vomiting This list may not describe all possible side effects. Call your doctor for medical advice about side effects. You may report side effects to FDA at 1-800-FDA-1088. Where should I keep my medication? This medication is given in a hospital or clinic. It will not be stored at home. NOTE: This sheet is a summary. It may not cover all possible information. If you have questions about this medicine, talk to your doctor, pharmacist, or health care provider.  2024 Elsevier/Gold Standard (2021-09-07 00:00:00)  The chemotherapy medication bag should finish at 46 hours, 96 hours, or 7 days. For example, if your pump is scheduled for 46 hours and it was put on at 4:00 p.m., it should finish at 2:00 p.m. the day it is scheduled to come off regardless of your  appointment time.     Estimated time to finish at 12:30 p.m. on Thursday 02/09/2023.   If the display on your pump reads "Low Volume" and it is beeping, take the batteries out of the pump and come to the cancer center for it to be taken off.   If the pump alarms go off prior to the pump reading "Low Volume" then call [phone] and someone can assist you.  If the plunger comes out and the chemotherapy medication is leaking out, please use your home chemo spill kit to clean up the spill. Do NOT use paper towels or other household products.  If you have problems or questions regarding your pump, please call either [phone] (24 hours a day) or the cancer center Monday-Friday 8:00 a.m.- 4:30 p.m. at the clinic number and we will assist you. If you are unable to get assistance, then go to the nearest Emergency Department and ask the staff to contact the IV team for assistance.

## 2023-02-07 NOTE — Progress Notes (Signed)
Patient seen by Lonna Cobb NP today  Vitals are within treatment parameters:Yes   Labs are within treatment parameters: No (Please specify and give further instructions.)  Plts 87 It's ok to proceed.  Treatment plan has been signed: Yes   Per physician team, Patient is ready for treatment and there are NO modifications to the treatment plan.

## 2023-02-08 ENCOUNTER — Telehealth: Payer: Self-pay

## 2023-02-08 NOTE — Telephone Encounter (Signed)
Left voicemail requesting call back in regards to request for FMLA for spouse to be completed.

## 2023-02-09 ENCOUNTER — Inpatient Hospital Stay: Payer: Medicaid Other

## 2023-02-09 VITALS — BP 116/82 | HR 99 | Temp 98.2°F | Resp 18

## 2023-02-09 DIAGNOSIS — Z5111 Encounter for antineoplastic chemotherapy: Secondary | ICD-10-CM | POA: Diagnosis not present

## 2023-02-09 DIAGNOSIS — C187 Malignant neoplasm of sigmoid colon: Secondary | ICD-10-CM

## 2023-02-09 MED ORDER — HEPARIN SOD (PORK) LOCK FLUSH 100 UNIT/ML IV SOLN
500.0000 [IU] | Freq: Once | INTRAVENOUS | Status: AC | PRN
  Administered 2023-02-09: 500 [IU]

## 2023-02-09 MED ORDER — SODIUM CHLORIDE 0.9% FLUSH
10.0000 mL | INTRAVENOUS | Status: DC | PRN
  Administered 2023-02-09: 10 mL

## 2023-02-09 NOTE — Patient Instructions (Signed)

## 2023-02-13 ENCOUNTER — Telehealth: Payer: Self-pay

## 2023-02-13 NOTE — Telephone Encounter (Signed)
Spoke to spouse informing her that her [employer] FMLA documents had been faxed to the company. Fax conformation received. Copy of documents mailed to patient as requested.

## 2023-02-14 ENCOUNTER — Telehealth: Payer: Self-pay | Admitting: Cardiovascular Disease

## 2023-02-14 ENCOUNTER — Ambulatory Visit (HOSPITAL_BASED_OUTPATIENT_CLINIC_OR_DEPARTMENT_OTHER)
Admission: RE | Admit: 2023-02-14 | Discharge: 2023-02-14 | Disposition: A | Discharge status: Home / Self Care | Payer: Medicaid Other | Source: Ambulatory Visit | Attending: Oncology | Admitting: Oncology

## 2023-02-14 ENCOUNTER — Inpatient Hospital Stay: Payer: Medicaid Other | Attending: Oncology

## 2023-02-14 DIAGNOSIS — C187 Malignant neoplasm of sigmoid colon: Secondary | ICD-10-CM | POA: Insufficient documentation

## 2023-02-14 DIAGNOSIS — R229 Localized swelling, mass and lump, unspecified: Secondary | ICD-10-CM | POA: Insufficient documentation

## 2023-02-14 DIAGNOSIS — C7801 Secondary malignant neoplasm of right lung: Secondary | ICD-10-CM | POA: Insufficient documentation

## 2023-02-14 DIAGNOSIS — Z452 Encounter for adjustment and management of vascular access device: Secondary | ICD-10-CM | POA: Insufficient documentation

## 2023-02-14 DIAGNOSIS — C7802 Secondary malignant neoplasm of left lung: Secondary | ICD-10-CM | POA: Insufficient documentation

## 2023-02-14 DIAGNOSIS — C787 Secondary malignant neoplasm of liver and intrahepatic bile duct: Secondary | ICD-10-CM | POA: Insufficient documentation

## 2023-02-14 MED ORDER — IOHEXOL 300 MG/ML  SOLN
100.0000 mL | Freq: Once | INTRAMUSCULAR | Status: AC | PRN
  Administered 2023-02-14: 100 mL via INTRAVENOUS

## 2023-02-14 MED ORDER — HEPARIN SOD (PORK) LOCK FLUSH 100 UNIT/ML IV SOLN: 500.0000 [IU] | Freq: Once | INTRAVENOUS | Status: DC

## 2023-02-14 NOTE — Telephone Encounter (Signed)
Patient Name: Jeff [name]  DOB: February 07, 1966 MRN: [id number]  Primary Cardiologist: Lorine Bears, MD  Chart reviewed as part of pre-operative protocol coverage.   Guidance for holding Eliquis will need to come from prescribing provider.  Cardiology is currently not managing this medication.   Napoleon Form, Leodis Rains, NP 02/14/2023, 3:10 PM

## 2023-02-14 NOTE — Patient Instructions (Signed)

## 2023-02-14 NOTE — Telephone Encounter (Signed)
Pre-operative Risk Assessment    Patient Name: Jeff [name]  DOB: [birth year] MRN: [id number]     Request for Surgical Clearance    Procedure:   EPIDURAL STEROID INJECTION  Date of Surgery:  Clearance TBD                               Surgeon:  DR Aileen Fass Surgeon's Group or Practice Name:  [facility] Phone number:  [phone] Fax number:  [phone]  Type of Clearance Requested:   - Pharmacy:  Hold Apixaban (Eliquis) 3 DAYS PRIOR AND RESUME NEXT DAY AFTER INJECTION  Type of Anesthesia:  Not Indicated  Additional requests/questions:    Signed, Dalia Heading   02/14/2023, 2:32 PM

## 2023-02-19 ENCOUNTER — Other Ambulatory Visit: Payer: Self-pay | Admitting: Oncology

## 2023-02-19 DIAGNOSIS — C187 Malignant neoplasm of sigmoid colon: Secondary | ICD-10-CM

## 2023-02-21 ENCOUNTER — Inpatient Hospital Stay: Payer: Medicaid Other

## 2023-02-21 ENCOUNTER — Inpatient Hospital Stay: Payer: Medicaid Other | Admitting: Oncology

## 2023-02-21 ENCOUNTER — Encounter: Payer: Self-pay | Admitting: *Deleted

## 2023-02-21 DIAGNOSIS — C187 Malignant neoplasm of sigmoid colon: Secondary | ICD-10-CM

## 2023-02-21 DIAGNOSIS — C7802 Secondary malignant neoplasm of left lung: Secondary | ICD-10-CM | POA: Insufficient documentation

## 2023-02-21 DIAGNOSIS — Z95828 Presence of other vascular implants and grafts: Secondary | ICD-10-CM

## 2023-02-21 DIAGNOSIS — C787 Secondary malignant neoplasm of liver and intrahepatic bile duct: Secondary | ICD-10-CM | POA: Insufficient documentation

## 2023-02-21 DIAGNOSIS — R229 Localized swelling, mass and lump, unspecified: Secondary | ICD-10-CM | POA: Insufficient documentation

## 2023-02-21 DIAGNOSIS — Z452 Encounter for adjustment and management of vascular access device: Secondary | ICD-10-CM | POA: Diagnosis not present

## 2023-02-21 DIAGNOSIS — C7801 Secondary malignant neoplasm of right lung: Secondary | ICD-10-CM | POA: Diagnosis not present

## 2023-02-21 LAB — CBC WITH DIFFERENTIAL (CANCER CENTER ONLY)
Abs Immature Granulocytes: 0.02 10*3/uL (ref 0.00–0.07)
Basophils Absolute: 0 10*3/uL (ref 0.0–0.1)
Basophils Relative: 0 %
Eosinophils Absolute: 0.1 10*3/uL (ref 0.0–0.5)
Eosinophils Relative: 1 %
HCT: 30.3 % — ABNORMAL LOW (ref 39.0–52.0)
Hemoglobin: 9.5 g/dL — ABNORMAL LOW (ref 13.0–17.0)
Immature Granulocytes: 0 %
Lymphocytes Relative: 29 %
Lymphs Abs: 1.9 10*3/uL (ref 0.7–4.0)
MCH: 27.5 pg (ref 26.0–34.0)
MCHC: 31.4 g/dL (ref 30.0–36.0)
MCV: 87.8 fL (ref 80.0–100.0)
Monocytes Absolute: 0.9 10*3/uL (ref 0.1–1.0)
Monocytes Relative: 14 %
Neutro Abs: 3.7 10*3/uL (ref 1.7–7.7)
Neutrophils Relative %: 56 %
Platelet Count: 100 10*3/uL — ABNORMAL LOW (ref 150–400)
RBC: 3.45 MIL/uL — ABNORMAL LOW (ref 4.22–5.81)
RDW: 17.6 % — ABNORMAL HIGH (ref 11.5–15.5)
WBC Count: 6.7 10*3/uL (ref 4.0–10.5)
nRBC: 0 % (ref 0.0–0.2)

## 2023-02-21 LAB — CMP (CANCER CENTER ONLY)
ALT: 18 U/L (ref 0–44)
AST: 32 U/L (ref 15–41)
Albumin: 3.5 g/dL (ref 3.5–5.0)
Alkaline Phosphatase: 101 U/L (ref 38–126)
Anion gap: 11 (ref 5–15)
BUN: 12 mg/dL (ref 6–20)
CO2: 25 mmol/L (ref 22–32)
Calcium: 8.8 mg/dL — ABNORMAL LOW (ref 8.9–10.3)
Chloride: 97 mmol/L — ABNORMAL LOW (ref 98–111)
Creatinine: 1.47 mg/dL — ABNORMAL HIGH (ref 0.61–1.24)
GFR, Estimated: 56 mL/min — ABNORMAL LOW (ref >60)
Glucose, Bld: 196 mg/dL — ABNORMAL HIGH (ref 70–99)
Potassium: 3.9 mmol/L (ref 3.5–5.1)
Sodium: 133 mmol/L — ABNORMAL LOW (ref 135–145)
Total Bilirubin: 0.7 mg/dL (ref 0.3–1.2)
Total Protein: 7.2 g/dL (ref 6.5–8.1)

## 2023-02-21 LAB — CEA (ACCESS): CEA (CHCC): 86.38 ng/mL — ABNORMAL HIGH (ref 0.00–5.00)

## 2023-02-21 MED ORDER — HEPARIN SOD (PORK) LOCK FLUSH 100 UNIT/ML IV SOLN
500.0000 [IU] | Freq: Once | INTRAVENOUS | Status: AC
  Administered 2023-02-21: 500 [IU] via INTRAVENOUS

## 2023-02-21 MED ORDER — SODIUM CHLORIDE 0.9% FLUSH
10.0000 mL | INTRAVENOUS | Status: DC | PRN
  Administered 2023-02-21: 10 mL via INTRAVENOUS

## 2023-02-21 NOTE — Progress Notes (Signed)
PATIENT NAVIGATOR PROGRESS NOTE  Name: Jeff [name] Date: 02/21/2023 MRN: [id number]  DOB: 12-17-1965   Reason for visit:  Refer to [facility] for clinical trial  Comments:  Called [facility] and made F/u appt with Dr Maryruth Hancock for 03/20/23 and email sent to Bethanie Dicker in regard to clinical trial at [facility]    Time spent counseling/coordinating care: 30-45 minutes

## 2023-02-21 NOTE — Patient Instructions (Signed)

## 2023-02-21 NOTE — Progress Notes (Signed)
Jeff [name]   Diagnosis: Colon cancer  INTERVAL HISTORY:   Jeff [name] returns as scheduled.  He completed another cycle of FOLFOX on 02/07/2023.  He had mild nausea and vomiting following chemotherapy.  No diarrhea.  Good appetite.  He generally feels well.  Objective:  Vital signs in last 24 hours:  Blood pressure 130/84, pulse 100, temperature 98.2 F (36.8 C), temperature source Temporal, resp. rate 18, height 5\' 10"  (1.778 m), weight 206 lb 9.6 oz (93.7 kg), SpO2 100%.    HEENT: No thrush or ulcers Resp: Lungs clear bilaterally Cardio: Regular rate and rhythm GI: Liver edge?  In the right subcostal region, no splenomegaly Vascular: No leg edema    Portacath/PICC-without erythema  Lab Results:  Lab Results  Component Value Date   WBC 6.7 02/21/2023   HGB 9.5 (L) 02/21/2023   HCT 30.3 (L) 02/21/2023   MCV 87.8 02/21/2023   PLT 100 (L) 02/21/2023   NEUTROABS 3.7 02/21/2023    CMP  Lab Results  Component Value Date   NA 133 (L) 02/21/2023   K 3.9 02/21/2023   CL 97 (L) 02/21/2023   CO2 25 02/21/2023   GLUCOSE 196 (H) 02/21/2023   BUN 12 02/21/2023   CREATININE 1.47 (H) 02/21/2023   CALCIUM 8.8 (L) 02/21/2023   PROT 7.2 02/21/2023   ALBUMIN 3.5 02/21/2023   AST 32 02/21/2023   ALT 18 02/21/2023   ALKPHOS 101 02/21/2023   BILITOT 0.7 02/21/2023   GFRNONAA 56 (L) 02/21/2023   GFRAA >60 01/30/2020    Lab Results  Component Value Date   CEA1 3.19 09/10/2020   CEA 86.34 (H) 02/07/2023    Medications: I have reviewed the patient's current medications.   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, biopsy confirmed adenocarcinoma         CTs chest, abdomen, and pelvis on 12/11/2015-indeterminate tiny pulmonary nodules, multiple liver metastases, small nodes in the sigmoid mesocolon Laparoscopic sigmoid colectomy, liver biopsy, Port-A-Cath placement  01/14/2016 Pathology sigmoid colon resection- colonic adenocarcinoma, 5.3 cm extending into pericolonic connective tissue, positive lymph-vascular invasion, positive perineural invasion, negative margins, metastatic carcinoma in 9 of 28 lymph nodes Pathology liver biopsy-metastatic colorectal adenocarcinoma MSI stable; mismatch repair protein normal APC alteration, K RAS wild-type, no BRAF mutation Cycle 1 FOLFOX 02/02/2016 Cycle 2 FOLFOX 02/15/2016 Cycle 3 FOLFOX 02/29/2016 Cycle 4 FOLFOX 03/14/2016 Cycle 5 FOLFOX 03/28/2016 Cycle 6 FOLFOX 04/11/2016 (oxaliplatin held secondary to thrombocytopenia) 04/21/2016 restaging CTs-difficulty evaluating liver lesions due to hepatic steatosis. Stable right adrenal nodule. No adenopathy or local recurrence near the rectosigmoid anastomotic site. Cycle 7 FOLFOX 04/25/2016 MRI liver 05/02/2016-partial improvement in hepatic metastases Cycle 8 FOLFOX 05/10/2016 Cycle 9 FOLFOX 05/23/2016 (oxaliplatin held due to thrombocytopenia) Cycle 10 FOLFOX 06/06/2016 (oxaliplatin dose reduced due to thrombocytopenia) Cycle 11 FOLFOX 06/27/2016 (oxaliplatin held due to neuropathy) Cycle 12 FOLFOX 07/11/2016 (oxaliplatin held) Initiation of maintenance Xeloda 7 days on/7 days off 07/27/2016 MRI liver 11/18/2016-decrease in hepatic metastatic disease. No new or progressive disease identified within the abdomen. Continuation of Xeloda 7 days on/7 days off MRI liver 04/27/2017-previous liver lesions not identified, no new lesions, no lymphadenopathy Xeloda continued 7 days on/7 days off MRI liver 12/04/2017 - no evidence of metastatic disease, hepatic steatosis Xeloda continued 7 days on/7 days off MRI liver 07/15/2018- no evidence of metastatic disease.  Stable severe hepatic steatosis. Xeloda continued 7 days on/7 days off MRI liver 03/16/2019-hepatic steatosis, no liver  mass, focal area of intrahepatic biliary dilatation in segments 2 and 3 of the left  lobe-increased Xeloda continued 7 days on/7 days off MRI abdomen 08/19/2019-no findings to suggest liver metastases.  Bilateral lung nodules measuring up to 2.8 cm, progressive and more conspicuous than on previous exam CT chest 08/29/2019-multiple pulmonary metastases, new from 04/21/2016 Cycle 1 FOLFIRI/bevacizumab 09/09/2019 Cycle 2 FOLFIRI/bevacizumab 09/26/2019  Cycle 3 FOLFIRI/bevacizumab 10/10/2019 Cycle 4 FOLFIRI/bevacizumab 10/24/2019 Cycle 5 FOLFIRI/bevacizumab 11/07/2019 CT chest 11/14/2019-decreased size of lung nodules, no new lesions, hepatic steatosis Cycle 6 FOLFIRI/bevacizumab 11/21/2019 Cycle 7 FOLFIRI/bevacizumab 12/05/2019 Cycle 8 FOLFIRI/bevacizumab 12/19/2019 Cycle 9 FOLFIRI/bevacizumab 01/02/2020 Cycle 10 FOLFIRI/bevacizumab 01/16/2020 CT chest 01/29/2020-stable bilateral pulmonary metastases.  No new or progressive metastatic disease in the chest. Cycle 11 FOLFIRI/bevacizumab 01/30/2020 Cycle 12 FOLFIRI/bevacizumab 02/19/2020 Cycle 13 FOLFIRI/bevacizumab 03/12/2020 Cycle 14 FOLFIRI/bevacizumab 04/02/2020 Cycle 15 FOLFIRI/bevacizumab 04/23/2020 Cycle 16 FOLFIRI/bevacizumab 05/21/2020 CT chest 06/09/2020-mild progression pulmonary metastasis.  Some lesions have increased in size while others are similar. Cycle 1 irinotecan/Panitumumab 06/18/2020 Cycle 2 irinotecan/Panitumumab 07/02/2020 Cycle 3 irinotecan/panitumumab 07/16/2020 Cycle 4 irinotecan/Panitumumab 07/30/2020 Cycle 5 irinotecan/Panitumumab 08/13/2020, Emend added Cycle 6 irinotecan/Panitumumab 08/27/2020 CT chest 09/08/2020-decreased size of pulmonary nodules, no progressive disease Cycle 7 irinotecan/panitumumab 09/02/2020 Cycle 8 irinotecan/panitumumab 09/24/2020 Cycle 9 irinotecan/Panitumumab 10/08/2020 10/22/2020 treatment held due to left foot fracture, need for surgery Cycle 10 irinotecan/panitumumab 11/24/2020 Cycle 11 irinotecan/Panitumumab 12/08/2020 Cycle 12 irinotecan/panitumumab 12/22/2020 Cycle 13 irinotecan/panitumumab  01/05/2021 01/20/2021 CT chest-mixed response with minimal increase in size of some lesions and minimal decrease in the size of other lesions.  Overall number of lesions is unchanged. Cycle 14 irinotecan/Panitumumab 02/05/2021 Cycle 15 irinotecan/Panitumumab 02/23/2021 Cycle 16 irinotecan/panitumumab 03/16/2021 Cycle 17 irinotecan/Panitumumab 04/13/2021 05/03/2021-CT chest-enlargement of pulmonary metastases, no new lesions 06/21/2021-cycle 1 Lonsurf/bevacizumab 07/19/2021-cycle 2 Lonsurf/bevacizumab 08/16/2021-cycle 3 Lonsurf/bevacizumab 09/09/2021 CT chest-bilateral lung nodules and masses with mixed response, overall stable to very minimally increased 09/13/2021 cycle 4 Lonsurf/bevacizumab 10/12/2021 cycle 5 Lonsurf/bevacizumab 10/21/2021 chest CT-bilateral pulmonary metastases without significant change.  No new or progressive disease within the chest. 11/08/2021 cycle 6 Lonsurf/bevacizumab 12/06/2021 cycle 7 Lonsurf/bevacizumab 01/03/2022 cycle 8 Lonsurf/bevacizumab CTs 01/25/2022-index lung lesion stable to mildly increased in size.  Mild retroperitoneal adenopathy, mildly increased in size compared to 12/16/2020. 02/01/2022 cycle 9 Lonsurf/bevacizumab 02/28/2022 cycle 10 Lonsurf/bevacizumab 03/28/2022 cycle 11 Lonsurf/bevacizumab CTs 04/21/2022-no change in bilateral pulmonary metastases, 2 new hypoattenuating liver lesions, new enlarged portacaval node Cycle 1 fruquintinib 05/24/2022 Cycle 2 fruquintinib 06/21/2022 Cycle 3 fruquintinib 07/19/2022 CTs 08/12/2022-stable lung nodules, hilar and retroperitoneal nodes, slight increase in size of liver metastases, stable right adrenal nodule Cycle 4 fruquintinib 08/16/2022 Cycle 5 fruquintinib 09/13/2022 Cycle 6 fruquintinib 10/11/2022 CTs 10/22/2022-progressive mediastinal, pericardial, and periportal adenopathy, enlargement of liver lesions, stable pulmonary nodules Cycle 1 FOLFOX 11/14/2022 Cycle 2 FOLFOX 11/29/2022, oxaliplatin dose reduced due to  thrombocytopenia Cycle 3 FOLFOX 12/27/2022 Cycle 4 FOLFOX 01/10/2023 Cycle 5 FOLFOX 01/24/2023 Cycle 6 FOLFOX 02/07/2023 CTs 02/14/2023-progressive metastases in the lungs, liver, and chest/retroperitoneal lymph nodes and new peritoneal soft tissue nodules   2.   Rectal bleeding and constipation secondary to #1   3.   History of peripheral vascular disease, status post left lower extremity vascular bypass surgery in April 2017   4.   History of nephrolithiasis   5.   History of Graves' disease treated with radioactive iodine   6.   Anxiety/depression   7.   Hypertension   8.   Hospitalization 01/19/2016 with wound dehiscence status post secondary suture closure of abdominal wall   9.  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 Hyperglycemia 06/20/2016-diagnosed with diabetes, maintained on insulin   11.  Positive COVID test 12/13/2018; positive COVID test 01/25/2021   12.  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  I reviewed the restaging CT findings Jeff [name].  I reviewed the CT images.  There is radiologic evidence of significant disease progression.  FOLFOX will be discontinued.  Jeff [name] understands there are limited standard treatment options given his treatment history.  I will refer him to Dr. Maryruth Hancock to consider enrollment on a clinical trial including the atezolizumab/vilastobart trial.  Jeff [name] will return for an office visit in 2 weeks.  Jeff Papas, MD  02/21/2023  9:03 AM

## 2023-02-23 ENCOUNTER — Inpatient Hospital Stay: Payer: Medicaid Other

## 2023-02-23 ENCOUNTER — Other Ambulatory Visit: Payer: Self-pay | Admitting: Nurse Practitioner

## 2023-02-23 DIAGNOSIS — H6691 Otitis media, unspecified, right ear: Secondary | ICD-10-CM

## 2023-02-23 DIAGNOSIS — C187 Malignant neoplasm of sigmoid colon: Secondary | ICD-10-CM

## 2023-03-03 ENCOUNTER — Emergency Department (HOSPITAL_COMMUNITY): Payer: Medicaid Other

## 2023-03-03 ENCOUNTER — Encounter (HOSPITAL_COMMUNITY): Payer: Self-pay

## 2023-03-03 ENCOUNTER — Inpatient Hospital Stay (HOSPITAL_COMMUNITY)
Admission: EM | Admit: 2023-03-03 | Discharge: 2023-03-09 | DRG: 374 | Disposition: A | Discharge status: Home / Self Care | Payer: Medicaid Other | Attending: Internal Medicine | Admitting: Internal Medicine

## 2023-03-03 ENCOUNTER — Other Ambulatory Visit: Payer: Self-pay

## 2023-03-03 DIAGNOSIS — Z7984 Long term (current) use of oral hypoglycemic drugs: Secondary | ICD-10-CM | POA: Diagnosis not present

## 2023-03-03 DIAGNOSIS — A419 Sepsis, unspecified organism: Secondary | ICD-10-CM | POA: Diagnosis present

## 2023-03-03 DIAGNOSIS — R6511 Systemic inflammatory response syndrome (SIRS) of non-infectious origin with acute organ dysfunction: Secondary | ICD-10-CM | POA: Diagnosis present

## 2023-03-03 DIAGNOSIS — C799 Secondary malignant neoplasm of unspecified site: Secondary | ICD-10-CM

## 2023-03-03 DIAGNOSIS — E1151 Type 2 diabetes mellitus with diabetic peripheral angiopathy without gangrene: Secondary | ICD-10-CM | POA: Diagnosis present

## 2023-03-03 DIAGNOSIS — Z79890 Hormone replacement therapy (postmenopausal): Secondary | ICD-10-CM

## 2023-03-03 DIAGNOSIS — I3139 Other pericardial effusion (noninflammatory): Secondary | ICD-10-CM | POA: Diagnosis present

## 2023-03-03 DIAGNOSIS — D649 Anemia, unspecified: Secondary | ICD-10-CM | POA: Insufficient documentation

## 2023-03-03 DIAGNOSIS — R6510 Systemic inflammatory response syndrome (SIRS) of non-infectious origin without acute organ dysfunction: Principal | ICD-10-CM | POA: Diagnosis present

## 2023-03-03 DIAGNOSIS — Z1152 Encounter for screening for COVID-19: Secondary | ICD-10-CM | POA: Diagnosis not present

## 2023-03-03 DIAGNOSIS — I10 Essential (primary) hypertension: Secondary | ICD-10-CM | POA: Diagnosis present

## 2023-03-03 DIAGNOSIS — R6520 Severe sepsis without septic shock: Secondary | ICD-10-CM | POA: Diagnosis not present

## 2023-03-03 DIAGNOSIS — Z8349 Family history of other endocrine, nutritional and metabolic diseases: Secondary | ICD-10-CM

## 2023-03-03 DIAGNOSIS — J90 Pleural effusion, not elsewhere classified: Secondary | ICD-10-CM | POA: Diagnosis present

## 2023-03-03 DIAGNOSIS — D630 Anemia in neoplastic disease: Secondary | ICD-10-CM | POA: Diagnosis present

## 2023-03-03 DIAGNOSIS — R000 Tachycardia, unspecified: Secondary | ICD-10-CM | POA: Diagnosis not present

## 2023-03-03 DIAGNOSIS — M549 Dorsalgia, unspecified: Secondary | ICD-10-CM | POA: Diagnosis present

## 2023-03-03 DIAGNOSIS — I252 Old myocardial infarction: Secondary | ICD-10-CM

## 2023-03-03 DIAGNOSIS — C189 Malignant neoplasm of colon, unspecified: Secondary | ICD-10-CM

## 2023-03-03 DIAGNOSIS — C7802 Secondary malignant neoplasm of left lung: Secondary | ICD-10-CM | POA: Diagnosis present

## 2023-03-03 DIAGNOSIS — A4189 Other specified sepsis: Principal | ICD-10-CM | POA: Diagnosis present

## 2023-03-03 DIAGNOSIS — Z794 Long term (current) use of insulin: Secondary | ICD-10-CM

## 2023-03-03 DIAGNOSIS — C7801 Secondary malignant neoplasm of right lung: Secondary | ICD-10-CM | POA: Diagnosis present

## 2023-03-03 DIAGNOSIS — R0902 Hypoxemia: Secondary | ICD-10-CM | POA: Diagnosis not present

## 2023-03-03 DIAGNOSIS — E785 Hyperlipidemia, unspecified: Secondary | ICD-10-CM | POA: Diagnosis present

## 2023-03-03 DIAGNOSIS — D6959 Other secondary thrombocytopenia: Secondary | ICD-10-CM | POA: Diagnosis present

## 2023-03-03 DIAGNOSIS — Z8249 Family history of ischemic heart disease and other diseases of the circulatory system: Secondary | ICD-10-CM

## 2023-03-03 DIAGNOSIS — F32A Depression, unspecified: Secondary | ICD-10-CM | POA: Diagnosis present

## 2023-03-03 DIAGNOSIS — E039 Hypothyroidism, unspecified: Secondary | ICD-10-CM | POA: Diagnosis present

## 2023-03-03 DIAGNOSIS — Z885 Allergy status to narcotic agent status: Secondary | ICD-10-CM

## 2023-03-03 DIAGNOSIS — Z79899 Other long term (current) drug therapy: Secondary | ICD-10-CM

## 2023-03-03 DIAGNOSIS — Z7901 Long term (current) use of anticoagulants: Secondary | ICD-10-CM | POA: Diagnosis not present

## 2023-03-03 DIAGNOSIS — C787 Secondary malignant neoplasm of liver and intrahepatic bile duct: Secondary | ICD-10-CM | POA: Diagnosis present

## 2023-03-03 DIAGNOSIS — C187 Malignant neoplasm of sigmoid colon: Secondary | ICD-10-CM | POA: Diagnosis not present

## 2023-03-03 DIAGNOSIS — D6481 Anemia due to antineoplastic chemotherapy: Secondary | ICD-10-CM | POA: Diagnosis present

## 2023-03-03 DIAGNOSIS — K219 Gastro-esophageal reflux disease without esophagitis: Secondary | ICD-10-CM | POA: Diagnosis present

## 2023-03-03 DIAGNOSIS — Z8673 Personal history of transient ischemic attack (TIA), and cerebral infarction without residual deficits: Secondary | ICD-10-CM

## 2023-03-03 DIAGNOSIS — E119 Type 2 diabetes mellitus without complications: Secondary | ICD-10-CM

## 2023-03-03 DIAGNOSIS — Z801 Family history of malignant neoplasm of trachea, bronchus and lung: Secondary | ICD-10-CM

## 2023-03-03 DIAGNOSIS — E860 Dehydration: Secondary | ICD-10-CM | POA: Diagnosis present

## 2023-03-03 DIAGNOSIS — G8929 Other chronic pain: Secondary | ICD-10-CM | POA: Diagnosis present

## 2023-03-03 DIAGNOSIS — Z981 Arthrodesis status: Secondary | ICD-10-CM

## 2023-03-03 DIAGNOSIS — T451X5A Adverse effect of antineoplastic and immunosuppressive drugs, initial encounter: Secondary | ICD-10-CM | POA: Diagnosis present

## 2023-03-03 DIAGNOSIS — N179 Acute kidney failure, unspecified: Secondary | ICD-10-CM | POA: Diagnosis present

## 2023-03-03 DIAGNOSIS — G40909 Epilepsy, unspecified, not intractable, without status epilepticus: Secondary | ICD-10-CM | POA: Diagnosis present

## 2023-03-03 DIAGNOSIS — E871 Hypo-osmolality and hyponatremia: Secondary | ICD-10-CM | POA: Diagnosis present

## 2023-03-03 DIAGNOSIS — Z833 Family history of diabetes mellitus: Secondary | ICD-10-CM

## 2023-03-03 DIAGNOSIS — E890 Postprocedural hypothyroidism: Secondary | ICD-10-CM | POA: Diagnosis present

## 2023-03-03 DIAGNOSIS — Z8616 Personal history of COVID-19: Secondary | ICD-10-CM

## 2023-03-03 DIAGNOSIS — F410 Panic disorder [episodic paroxysmal anxiety] without agoraphobia: Secondary | ICD-10-CM | POA: Diagnosis not present

## 2023-03-03 LAB — CBC WITH DIFFERENTIAL/PLATELET
Abs Immature Granulocytes: 0.91 10*3/uL — ABNORMAL HIGH (ref 0.00–0.07)
Basophils Absolute: 0.1 10*3/uL (ref 0.0–0.1)
Basophils Relative: 0 %
Eosinophils Absolute: 0 10*3/uL (ref 0.0–0.5)
Eosinophils Relative: 0 %
HCT: 31.6 % — ABNORMAL LOW (ref 39.0–52.0)
Hemoglobin: 9.8 g/dL — ABNORMAL LOW (ref 13.0–17.0)
Immature Granulocytes: 5 %
Lymphocytes Relative: 13 %
Lymphs Abs: 2.5 10*3/uL (ref 0.7–4.0)
MCH: 27.3 pg (ref 26.0–34.0)
MCHC: 31 g/dL (ref 30.0–36.0)
MCV: 88 fL (ref 80.0–100.0)
Monocytes Absolute: 1.9 10*3/uL — ABNORMAL HIGH (ref 0.1–1.0)
Monocytes Relative: 10 %
Neutro Abs: 13.4 10*3/uL — ABNORMAL HIGH (ref 1.7–7.7)
Neutrophils Relative %: 72 %
Platelets: 238 10*3/uL (ref 150–400)
RBC: 3.59 MIL/uL — ABNORMAL LOW (ref 4.22–5.81)
RDW: 17.5 % — ABNORMAL HIGH (ref 11.5–15.5)
WBC: 18.7 10*3/uL — ABNORMAL HIGH (ref 4.0–10.5)
nRBC: 0 % (ref 0.0–0.2)

## 2023-03-03 LAB — COMPREHENSIVE METABOLIC PANEL
ALT: 34 U/L (ref 0–44)
AST: 63 U/L — ABNORMAL HIGH (ref 15–41)
Albumin: 2.5 g/dL — ABNORMAL LOW (ref 3.5–5.0)
Alkaline Phosphatase: 164 U/L — ABNORMAL HIGH (ref 38–126)
Anion gap: 16 — ABNORMAL HIGH (ref 5–15)
BUN: 16 mg/dL (ref 6–20)
CO2: 18 mmol/L — ABNORMAL LOW (ref 22–32)
Calcium: 8.3 mg/dL — ABNORMAL LOW (ref 8.9–10.3)
Chloride: 97 mmol/L — ABNORMAL LOW (ref 98–111)
Creatinine, Ser: 1.69 mg/dL — ABNORMAL HIGH (ref 0.61–1.24)
GFR, Estimated: 47 mL/min — ABNORMAL LOW (ref >60)
Glucose, Bld: 178 mg/dL — ABNORMAL HIGH (ref 70–99)
Potassium: 4.5 mmol/L (ref 3.5–5.1)
Sodium: 131 mmol/L — ABNORMAL LOW (ref 135–145)
Total Bilirubin: 0.9 mg/dL (ref 0.3–1.2)
Total Protein: 8 g/dL (ref 6.5–8.1)

## 2023-03-03 LAB — LIPASE, BLOOD: Lipase: 24 U/L (ref 11–51)

## 2023-03-03 LAB — RESP PANEL BY RT-PCR (RSV, FLU A&B, COVID)  RVPGX2
Influenza A by PCR: NEGATIVE
Influenza B by PCR: NEGATIVE
Resp Syncytial Virus by PCR: NEGATIVE
SARS Coronavirus 2 by RT PCR: NEGATIVE

## 2023-03-03 LAB — TROPONIN I (HIGH SENSITIVITY)
Troponin I (High Sensitivity): 5 ng/L (ref <18)
Troponin I (High Sensitivity): 5 ng/L (ref <18)

## 2023-03-03 LAB — LACTIC ACID, PLASMA
Lactic Acid, Venous: 4.5 mmol/L (ref 0.5–1.9)
Lactic Acid, Venous: 2.6 mmol/L (ref 0.5–1.9)

## 2023-03-03 LAB — GLUCOSE, CAPILLARY: Glucose-Capillary: 163 mg/dL — ABNORMAL HIGH (ref 70–99)

## 2023-03-03 MED ORDER — METRONIDAZOLE 500 MG/100ML IV SOLN
500.0000 mg | Freq: Once | INTRAVENOUS | Status: AC
  Administered 2023-03-03: 500 mg via INTRAVENOUS
  Filled 2023-03-03: qty 100

## 2023-03-03 MED ORDER — LEVOTHYROXINE SODIUM 25 MCG PO TABS
137.0000 ug | ORAL_TABLET | Freq: Every day | ORAL | Status: DC
  Administered 2023-03-04 – 2023-03-09 (×6): 137 ug via ORAL
  Filled 2023-03-03 (×7): qty 1

## 2023-03-03 MED ORDER — VANCOMYCIN HCL 1500 MG/300ML IV SOLN
1500.0000 mg | Freq: Every day | INTRAVENOUS | Status: DC
  Administered 2023-03-03: 1500 mg via INTRAVENOUS
  Filled 2023-03-03: qty 300

## 2023-03-03 MED ORDER — INSULIN ASPART 100 UNIT/ML IJ SOLN
0.0000 [IU] | Freq: Every day | INTRAMUSCULAR | Status: DC
  Filled 2023-03-03: qty 0.05

## 2023-03-03 MED ORDER — FENTANYL CITRATE PF 50 MCG/ML IJ SOSY
50.0000 ug | PREFILLED_SYRINGE | Freq: Once | INTRAMUSCULAR | Status: AC
  Administered 2023-03-03: 50 ug via INTRAVENOUS
  Filled 2023-03-03: qty 1

## 2023-03-03 MED ORDER — ALBUTEROL SULFATE (2.5 MG/3ML) 0.083% IN NEBU: 2.5000 mg | INHALATION_SOLUTION | RESPIRATORY_TRACT | Status: DC | PRN

## 2023-03-03 MED ORDER — TAMSULOSIN HCL 0.4 MG PO CAPS
0.4000 mg | ORAL_CAPSULE | Freq: Two times a day (BID) | ORAL | Status: DC
  Administered 2023-03-03 – 2023-03-09 (×12): 0.4 mg via ORAL
  Filled 2023-03-03 (×12): qty 1

## 2023-03-03 MED ORDER — ATORVASTATIN CALCIUM 20 MG PO TABS
80.0000 mg | ORAL_TABLET | Freq: Every day | ORAL | Status: DC
  Administered 2023-03-03 – 2023-03-08 (×6): 80 mg via ORAL
  Filled 2023-03-03 (×2): qty 4
  Filled 2023-03-03: qty 2
  Filled 2023-03-03 (×4): qty 4

## 2023-03-03 MED ORDER — MIRTAZAPINE 15 MG PO TABS
15.0000 mg | ORAL_TABLET | Freq: Every day | ORAL | Status: DC
  Administered 2023-03-03 – 2023-03-08 (×6): 15 mg via ORAL
  Filled 2023-03-03 (×6): qty 1

## 2023-03-03 MED ORDER — VANCOMYCIN HCL IN DEXTROSE 1-5 GM/200ML-% IV SOLN
1000.0000 mg | Freq: Once | INTRAVENOUS | Status: AC
  Administered 2023-03-03: 1000 mg via INTRAVENOUS
  Filled 2023-03-03: qty 200

## 2023-03-03 MED ORDER — CARMEX CLASSIC LIP BALM EX OINT: 1.0000 | TOPICAL_OINTMENT | CUTANEOUS | Status: DC | PRN

## 2023-03-03 MED ORDER — METRONIDAZOLE 500 MG/100ML IV SOLN
500.0000 mg | Freq: Two times a day (BID) | INTRAVENOUS | Status: DC
  Administered 2023-03-04 – 2023-03-08 (×9): 500 mg via INTRAVENOUS
  Filled 2023-03-03 (×9): qty 100

## 2023-03-03 MED ORDER — CHLORHEXIDINE GLUCONATE CLOTH 2 % EX PADS
6.0000 | MEDICATED_PAD | Freq: Every day | CUTANEOUS | Status: DC
  Administered 2023-03-04 – 2023-03-09 (×6): 6 via TOPICAL

## 2023-03-03 MED ORDER — APIXABAN 5 MG PO TABS
5.0000 mg | ORAL_TABLET | Freq: Two times a day (BID) | ORAL | Status: DC
  Administered 2023-03-03 – 2023-03-09 (×11): 5 mg via ORAL
  Filled 2023-03-03 (×12): qty 1

## 2023-03-03 MED ORDER — METOPROLOL TARTRATE 50 MG PO TABS
100.0000 mg | ORAL_TABLET | Freq: Two times a day (BID) | ORAL | Status: DC
  Administered 2023-03-03 – 2023-03-09 (×12): 100 mg via ORAL
  Filled 2023-03-03 (×7): qty 2
  Filled 2023-03-03 (×2): qty 4
  Filled 2023-03-03 (×3): qty 2

## 2023-03-03 MED ORDER — SODIUM CHLORIDE 0.9 % IV SOLN
2.0000 g | Freq: Once | INTRAVENOUS | Status: AC
  Administered 2023-03-03: 2 g via INTRAVENOUS
  Filled 2023-03-03: qty 12.5

## 2023-03-03 MED ORDER — SODIUM CHLORIDE 0.9 % IV BOLUS
1000.0000 mL | Freq: Once | INTRAVENOUS | Status: AC
  Administered 2023-03-03: 1000 mL via INTRAVENOUS

## 2023-03-03 MED ORDER — INSULIN ASPART 100 UNIT/ML IJ SOLN
0.0000 [IU] | Freq: Three times a day (TID) | INTRAMUSCULAR | Status: DC
  Administered 2023-03-04: 3 [IU] via SUBCUTANEOUS
  Administered 2023-03-05: 2 [IU] via SUBCUTANEOUS
  Administered 2023-03-06: 3 [IU] via SUBCUTANEOUS
  Administered 2023-03-06 – 2023-03-09 (×6): 2 [IU] via SUBCUTANEOUS
  Filled 2023-03-03: qty 0.15

## 2023-03-03 MED ORDER — ACETAMINOPHEN 325 MG PO TABS
650.0000 mg | ORAL_TABLET | Freq: Four times a day (QID) | ORAL | Status: DC | PRN
  Administered 2023-03-03 – 2023-03-07 (×4): 650 mg via ORAL
  Filled 2023-03-03 (×4): qty 2

## 2023-03-03 MED ORDER — ONDANSETRON HCL 4 MG/2ML IJ SOLN
4.0000 mg | Freq: Once | INTRAMUSCULAR | Status: AC
  Administered 2023-03-03: 4 mg via INTRAVENOUS
  Filled 2023-03-03: qty 2

## 2023-03-03 MED ORDER — SALINE SPRAY 0.65 % NA SOLN: 1.0000 | NASAL | Status: DC | PRN

## 2023-03-03 MED ORDER — ONDANSETRON HCL 4 MG/2ML IJ SOLN
4.0000 mg | Freq: Four times a day (QID) | INTRAMUSCULAR | Status: DC | PRN
  Filled 2023-03-03: qty 2

## 2023-03-03 MED ORDER — ONDANSETRON HCL 4 MG PO TABS
4.0000 mg | ORAL_TABLET | Freq: Four times a day (QID) | ORAL | Status: DC | PRN
  Administered 2023-03-07: 4 mg via ORAL
  Filled 2023-03-03: qty 1

## 2023-03-03 MED ORDER — ACETAMINOPHEN 650 MG RE SUPP: 650.0000 mg | Freq: Four times a day (QID) | RECTAL | Status: DC | PRN

## 2023-03-03 MED ORDER — OXYCODONE HCL ER 15 MG PO T12A
15.0000 mg | EXTENDED_RELEASE_TABLET | Freq: Two times a day (BID) | ORAL | Status: DC
  Administered 2023-03-03 – 2023-03-09 (×12): 15 mg via ORAL
  Filled 2023-03-03 (×12): qty 1

## 2023-03-03 MED ORDER — LEVETIRACETAM 500 MG PO TABS
750.0000 mg | ORAL_TABLET | Freq: Two times a day (BID) | ORAL | Status: DC
  Administered 2023-03-03 – 2023-03-09 (×12): 750 mg via ORAL
  Filled 2023-03-03 (×13): qty 1

## 2023-03-03 MED ORDER — OXYCODONE HCL 5 MG PO TABS
5.0000 mg | ORAL_TABLET | Freq: Four times a day (QID) | ORAL | Status: DC | PRN
  Administered 2023-03-04: 5 mg via ORAL
  Filled 2023-03-03: qty 1

## 2023-03-03 MED ORDER — IOHEXOL 350 MG/ML SOLN
100.0000 mL | Freq: Once | INTRAVENOUS | Status: AC | PRN
  Administered 2023-03-03: 75 mL via INTRAVENOUS

## 2023-03-03 MED ORDER — OXYCODONE-ACETAMINOPHEN 5-325 MG PO TABS
1.0000 | ORAL_TABLET | Freq: Four times a day (QID) | ORAL | Status: DC | PRN
  Administered 2023-03-04 – 2023-03-09 (×4): 1 via ORAL
  Filled 2023-03-03 (×4): qty 1

## 2023-03-03 MED ORDER — GUAIFENESIN-DM 100-10 MG/5ML PO SYRP
5.0000 mL | ORAL_SOLUTION | ORAL | Status: DC | PRN
  Administered 2023-03-04 – 2023-03-09 (×7): 5 mL via ORAL
  Filled 2023-03-03 (×7): qty 10

## 2023-03-03 MED ORDER — FENTANYL CITRATE PF 50 MCG/ML IJ SOSY: 12.5000 ug | PREFILLED_SYRINGE | INTRAMUSCULAR | Status: DC | PRN

## 2023-03-03 MED ORDER — SODIUM CHLORIDE 0.9 % IV SOLN
2.0000 g | Freq: Two times a day (BID) | INTRAVENOUS | Status: DC
  Administered 2023-03-03 – 2023-03-04 (×2): 2 g via INTRAVENOUS
  Filled 2023-03-03: qty 12.5

## 2023-03-03 MED ORDER — TRAZODONE HCL 50 MG PO TABS: 25.0000 mg | ORAL_TABLET | Freq: Every evening | ORAL | Status: DC | PRN

## 2023-03-03 MED ORDER — SODIUM CHLORIDE 0.9 % IV SOLN: INTRAVENOUS | Status: AC

## 2023-03-03 MED ORDER — OXYCODONE-ACETAMINOPHEN 10-325 MG PO TABS: 1.0000 | ORAL_TABLET | Freq: Four times a day (QID) | ORAL | Status: DC | PRN

## 2023-03-03 MED ORDER — PANTOPRAZOLE SODIUM 40 MG PO TBEC
40.0000 mg | DELAYED_RELEASE_TABLET | Freq: Two times a day (BID) | ORAL | Status: DC
  Administered 2023-03-03 – 2023-03-09 (×12): 40 mg via ORAL
  Filled 2023-03-03 (×12): qty 1

## 2023-03-03 NOTE — ED Notes (Signed)
ED TO INPATIENT HANDOFF REPORT  ED Nurse Name and Phone #: [name] [phone]  S Name/Age/Gender Jeff [name] 57 y.o. male Room/Bed: WA18/WA18  Code Status   Code Status: Full Code  Home/SNF/Other Home Patient oriented to: self, place, time, and situation Is this baseline? Yes   Triage Complete: Triage complete  Chief Complaint Severe sepsis (HCC) [A41.9, R65.20]  Triage Note Pt is a colon cancer pt. Pt complaining nausea/emesis, and fatigue that has been increasing over the last couple of weeks. HR 154 in triage, denies any pain/diarrhea.   Allergies Allergies  Allergen Reactions   Hydrocodone Hives    Level of Care/Admitting Diagnosis ED Disposition     ED Disposition  Admit   Condition  --   Comment  Hospital Area: [facility] [[id number]]  Level of Care: Stepdown [14]  Admit to SDU based on following criteria: Severe physiological/psychological symptoms:  Any diagnosis requiring assessment & intervention at least every 4 hours on an ongoing basis to obtain desired patient outcomes including stability and rehabilitation  May admit patient to [facility] or [facility] if equivalent level of care is available:: Yes  Covid Evaluation: Asymptomatic - no recent exposure (last 10 days) testing not required  Diagnosis: Severe sepsis [facility]) [[id number]]  Admitting Physician: Jeff [name] [[id number]]  Attending Physician: Jeff [name], Jeff [name] [[id number]]  Certification:: I certify this patient will need inpatient services for at least 2 midnights  Expected Medical Readiness: 03/06/2023          B Medical/Surgery History Past Medical History:  Diagnosis Date   Allergic rhinitis    Anxiety    Arthritis    Chemotherapy-induced thrombocytopenia    Chronic nausea    mild intermittant and loose stools due to colon cancer   Chronic neck and back pain    Depression    ED (erectile dysfunction)    Generalized idiopathic epilepsy and epileptic  syndromes, not intractable, without status epilepticus (HCC) 10/25/2019   (11-10-2020 per pt wife last seizure 11/ 2021)  neurologist-- dr Jeff [name]---  seizure activity/ DKA ;  EEG w/ evidence epileto genicity arising right frontocentral region;  taking keppra   GERD (gastroesophageal reflux disease)    Hiatal hernia    History of 2019 novel coronavirus disease (COVID-19) 12/13/2018   per pt wife , pt had moderate symtptoms that resolved   History of Graves' disease    s/p RAI 02/ 2012;  previous seen by endocrinologist--- dr Jeff [name]   History of kidney stones    History of non-ST elevation myocardial infarction (NSTEMI)    Jan 2014--  no CAD;  per notes probable coronary vasospasm   History of panic attacks    History of thyroid storm    Nov 2011 w/ grave's ,  s/p RAI 02/ 2012   Hyperlipidemia    Hypertension    Hypothyroidism following radioiodine therapy    RAI in 02/  2012---  previously followed by dr Jeff [name],  now followed by pcp   Lower urinary tract symptoms (LUTS)    Metastatic colon cancer to liver [facility] [name]) 11/2015   oncologist-- Jeff [name]/ and seen by dr Jeff [name];  01-14-2016 s/p sigmoid colectomy, liver bx, node dissection, PAC;   chemo started 09/ 2017  ;  04/ 2021 metastatic pulmonary nodules;   active treatment every 2 wks   Nephrolithiasis    left side non-obstructive per imaging 04/ 2022   PAD (peripheral artery disease) [facility]) cardiologist-  dr Jeff [name]   cardiologist ----  dr Jeff [name] ;   left popliteal occlusion behind knee w/ collaterals;  s/p attempted angioplasty 01/ 2016;  09-11-2015 s/p pop-TPT w/ ipsNR GSV   PONV (postoperative nausea and vomiting)    Rash    due to chemo   Type 2 diabetes mellitus treated with insulin (HCC)    followed by pcp----   (11-10-2020  per pt wife blood sugar check's 2-4 times daily,  fasting sugar--- 130--168)   Unspecified fracture of left foot, initial encounter for closed fracture 10/17/2020   midfoot   Past Surgical History:   Procedure Laterality Date   ABDOMINAL AORTAGRAM N/A 02/19/2014   Procedure: ABDOMINAL Ronny Flurry;  Surgeon: Jeff Ouch, MD;  Location: [facility] CATH [facility];  Service: Cardiovascular;  Laterality: N/A;   ANTERIOR CERVICAL DECOMP/DISCECTOMY FUSION  09/08/2010   C3 -4   CYSTOSCOPY W/ URETERAL STENT PLACEMENT Right 01/04/2015   Procedure: CYSTOSCOPY WITH RETROGRADE PYELOGRAM/URETERAL STENT PLACEMENT;  Surgeon: Jeff Matar, MD;  [facility];  Service: Urology;  Laterality: Right;   CYSTOSCOPY W/ URETERAL STENT REMOVAL Right 01/26/2015   Procedure: CYSTOSCOPY WITH STENT REMOVAL;  Surgeon: Jeff Matar, MD;  Location: [facility];  Service: Urology;  Laterality: Right;   CYSTOSCOPY/URETEROSCOPY/HOLMIUM LASER/STENT PLACEMENT Right 01/26/2015   Procedure: CYSTOSCOPY/URETEROSCOPY RIGHT;  Surgeon: Jeff Matar, MD;  Location: [facility];  Service: Urology;  Laterality: Right;   FEMORAL-POPLITEAL BYPASS GRAFT Left 09/11/2015   Procedure: BYPASS GRAFT LEFT ABOVE KNEE TO BELOW KNEE POPLITEAL ARTERY WITH LEFT GREATER SAPHENOUS VEIN;  Surgeon: Jeff Libman, MD;  Location: [facility] OR;  Service: Vascular;  Laterality: Left;   INCISIONAL HERNIA REPAIR N/A 01/19/2016   Procedure: HERNIA REPAIR INCISIONAL;  Surgeon: Jeff Levee, MD;  Location: [facility] ORS;  Service: General;  Laterality: N/A;   LAMINECTOMY AND MICRODISCECTOMY LUMBAR SPINE  03/26/2009   left L5 -- S1   LAPAROSCOPIC SIGMOID COLECTOMY N/A 01/14/2016   Procedure: LAPAROSCOPIC SIGMOID COLECTOMY;  Surgeon: Jeff Levee, MD;  Location: [facility] ORS;  Service: General;  Laterality: N/A;   LEFT HEART CATHETERIZATION WITH CORONARY ANGIOGRAM N/A 06/12/2012   Procedure: LEFT HEART CATHETERIZATION WITH CORONARY ANGIOGRAM;  Surgeon: Jeff Stade, MD;  Location: [facility] CATH LAB;  Service: Cardiovascular;  Laterality: N/A;   No sig. CAD/  normal LVF, ef 55-65%   LIVER BIOPSY N/A 01/14/2016   Procedure: LIVER BIOPSY;  Surgeon:  Jeff Levee, MD;  Location: [facility] ORS;  Service: General;  Laterality: N/A;   LOWER EXTREMITY ANGIOGRAM Left 05/28/2014   Procedure: LOWER EXTREMITY ANGIOGRAM;  Surgeon: Jeff Ouch, MD;  Location: [facility] CATH [facility];  Service: Cardiovascular;  Laterality: Left;  Failed PTA CTO   OPEN REDUCTION INTERNAL FIXATION (ORIF) FOOT LISFRANC FRACTURE Left 11/11/2020   Procedure: OPEN REDUCTION INTERNAL FIXATION (ORIF) FOOT LISFRANC FRACTURE;  Surgeon: Park Liter, DPM;  Location: [facility];  Service: Podiatry;  Laterality: Left;  General with Regional Block   POPLITEAL ARTERY ANGIOPLASTY Left 05/28/2014    dr Jeff [name]   Attempted and unsuccessful due to inability to cross the occlusionnotes    PORTACATH PLACEMENT Right 01/14/2016   Procedure: INSERTION PORT-A-CATH;  Surgeon: Jeff Levee, MD;  Location: [facility] ORS;  Service: General;  Laterality: Right;   POSTERIOR CERVICAL FUSION/FORAMINOTOMY N/A 12/10/2012   Procedure: POSTERIOR CERVICAL FUSION/FORAMINOTOMY LEVEL 1;  Surgeon: Cristi Loron, MD;  Location: [facility] NEURO [facility];  Service: Neurosurgery;  Laterality: N/A;  Cervical three-four posterior cervical fusion with lateral mass screws   POSTERIOR LUMBAR FUSION  11-11-2009;   07-15-2013   L5 -- S1;   L4-- S1   SHOULDER ARTHROSCOPY Right 03/08/2004   debridement labral tear/  DCR/  acromioplasty   UMBILICAL HERNIA REPAIR  1980   VEIN HARVEST Left 09/11/2015   Procedure: LEFT GREATER SAPHENOUS VEIN HARVEST;  Surgeon: Jeff Libman, MD;  Location: [facility] OR;  Service: Vascular;  Laterality: Left;     A IV Location/Drains/Wounds Patient Lines/Drains/Airways Status     Active Line/Drains/Airways     Name Placement date Placement time Site Days   Implanted Port 01/14/16  01/14/16  1618  --  2605   Ureteral Drain/Stent Right ureter 6 Fr. 01/04/15  1720  Right ureter  2980   Incision - 3 Ports Abdomen Right Left Mid 01/14/16  1445  -- 2605            Intake/Output Last 24  hours  Intake/Output Summary (Last 24 hours) at 03/03/2023 2027 Last data filed at 03/03/2023 1927 Gross per 24 hour  Intake 2398.69 ml  Output --  Net 2398.69 ml    Labs/Imaging Results for orders placed or performed during the hospital encounter of 03/03/23 (from the past 48 hour(s))  CBC with Differential     Status: Abnormal   Collection Time: 03/03/23  2:44 PM  Result Value Ref Range   WBC 18.7 (H) 4.0 - 10.5 K/uL   RBC 3.59 (L) 4.22 - 5.81 MIL/uL   Hemoglobin 9.8 (L) 13.0 - 17.0 g/dL   HCT 08.6 (L) 57.8 - 46.9 %   MCV 88.0 80.0 - 100.0 fL   MCH 27.3 26.0 - 34.0 pg   MCHC 31.0 30.0 - 36.0 g/dL   RDW 62.9 (H) 52.8 - 41.3 %   Platelets 238 150 - 400 K/uL   nRBC 0.0 0.0 - 0.2 %   Neutrophils Relative % 72 %   Neutro Abs 13.4 (H) 1.7 - 7.7 K/uL   Lymphocytes Relative 13 %   Lymphs Abs 2.5 0.7 - 4.0 K/uL   Monocytes Relative 10 %   Monocytes Absolute 1.9 (H) 0.1 - 1.0 K/uL   Eosinophils Relative 0 %   Eosinophils Absolute 0.0 0.0 - 0.5 K/uL   Basophils Relative 0 %   Basophils Absolute 0.1 0.0 - 0.1 K/uL   Immature Granulocytes 5 %   Abs Immature Granulocytes 0.91 (H) 0.00 - 0.07 K/uL    Comment: Performed at [facility], [address], [place], [place] [address]  Comprehensive metabolic panel     Status: Abnormal   Collection Time: 03/03/23  2:44 PM  Result Value Ref Range   Sodium 131 (L) 135 - 145 mmol/L   Potassium 4.5 3.5 - 5.1 mmol/L   Chloride 97 (L) 98 - 111 mmol/L   CO2 18 (L) 22 - 32 mmol/L   Glucose, Bld 178 (H) 70 - 99 mg/dL    Comment: Glucose reference range applies only to samples taken after fasting for at least 8 hours.   BUN 16 6 - 20 mg/dL   Creatinine, Ser 0.27 (H) 0.61 - 1.24 mg/dL   Calcium 8.3 (L) 8.9 - 10.3 mg/dL   Total Protein 8.0 6.5 - 8.1 g/dL   Albumin 2.5 (L) 3.5 - 5.0 g/dL   AST 63 (H) 15 - 41 U/L   ALT 34 0 - 44 U/L   Alkaline Phosphatase 164 (H) 38 - 126 U/L   Total Bilirubin 0.9 0.3 - 1.2 mg/dL   GFR,  Estimated 47 (L) >60  mL/min    Comment: (NOTE) Calculated using the CKD-EPI Creatinine Equation (2021)    Anion gap 16 (H) 5 - 15    Comment: Performed at [facility], [address], [place], [place] [address]  Lipase, blood     Status: None   Collection Time: 03/03/23  2:44 PM  Result Value Ref Range   Lipase 24 11 - 51 U/L    Comment: Performed at [facility], [address], [place], [place] [address]  Troponin I (High Sensitivity)     Status: None   Collection Time: 03/03/23  2:44 PM  Result Value Ref Range   Troponin I (High Sensitivity) 5 <18 ng/L    Comment: (NOTE) Elevated high sensitivity troponin I (hsTnI) values and significant  changes across serial measurements may suggest ACS but many other  chronic and acute conditions are known to elevate hsTnI results.  Refer to the "Links" section for chest pain algorithms and additional  guidance. Performed at [facility], [address] [place], [place] [address]   Resp panel by RT-PCR (RSV, Flu A&B, Covid) Anterior Nasal Swab     Status: None   Collection Time: 03/03/23  2:50 PM   Specimen: Anterior Nasal Swab  Result Value Ref Range   SARS Coronavirus 2 by RT PCR NEGATIVE NEGATIVE    Comment: (NOTE) SARS-CoV-2 target nucleic acids are NOT DETECTED.  The SARS-CoV-2 RNA is generally detectable in upper respiratory specimens during the acute phase of infection. The lowest concentration of SARS-CoV-2 viral copies this assay can detect is 138 copies/mL. A negative result does not preclude SARS-Cov-2 infection and should not be used as the sole basis for treatment or other patient management decisions. A negative result may occur with  improper specimen collection/handling, submission of specimen other than nasopharyngeal swab, presence of viral mutation(s) within the areas targeted by this assay, and inadequate number of viral copies(<138 copies/mL). A negative  result must be combined with clinical observations, patient history, and epidemiological information. The expected result is Negative.  Fact Sheet for Patients:  BloggerCourse.com  Fact Sheet for Healthcare Providers:  SeriousBroker.it  This test is no t yet approved or cleared by the [place] FDA and  has been authorized for detection and/or diagnosis of SARS-CoV-2 by FDA under an Emergency Use Authorization (EUA). This EUA will remain  in effect (meaning this test can be used) for the duration of the COVID-19 declaration under Section 564(b)(1) of the Act, 21 U.S.C.section 360bbb-3(b)(1), unless the authorization is terminated  or revoked sooner.       Influenza A by PCR NEGATIVE NEGATIVE   Influenza B by PCR NEGATIVE NEGATIVE    Comment: (NOTE) The Xpert Xpress SARS-CoV-2/FLU/RSV plus assay is intended as an aid in the diagnosis of influenza from Nasopharyngeal swab specimens and should not be used as a sole basis for treatment. Nasal washings and aspirates are unacceptable for Xpert Xpress SARS-CoV-2/FLU/RSV testing.  Fact Sheet for Patients: BloggerCourse.com  Fact Sheet for Healthcare Providers: SeriousBroker.it  This test is not yet approved or cleared by the [place] FDA and has been authorized for detection and/or diagnosis of SARS-CoV-2 by FDA under an Emergency Use Authorization (EUA). This EUA will remain in effect (meaning this test can be used) for the duration of the COVID-19 declaration under Section 564(b)(1) of the Act, 21 U.S.C. section 360bbb-3(b)(1), unless the authorization is terminated or revoked.     Resp Syncytial Virus by PCR NEGATIVE NEGATIVE  Comment: (NOTE) Fact Sheet for Patients: BloggerCourse.com  Fact Sheet for Healthcare Providers: SeriousBroker.it  This test is not yet  approved or cleared by the [place] FDA and has been authorized for detection and/or diagnosis of SARS-CoV-2 by FDA under an Emergency Use Authorization (EUA). This EUA will remain in effect (meaning this test can be used) for the duration of the COVID-19 declaration under Section 564(b)(1) of the Act, 21 U.S.C. section 360bbb-3(b)(1), unless the authorization is terminated or revoked.  Performed at [facility], [address], [place], [place] [address]   Lactic acid, plasma     Status: Abnormal   Collection Time: 03/03/23  3:15 PM  Result Value Ref Range   Lactic Acid, Venous 4.5 (HH) 0.5 - 1.9 mmol/L    Comment: CRITICAL RESULT CALLED TO, READ BACK BY AND VERIFIED WITH C.HOLLATZ, RN AT 1641 ON 10.18.24 BY N.THOMPSON Performed at [facility], [address], [place], [place] [address]   Lactic acid, plasma     Status: Abnormal   Collection Time: 03/03/23  5:30 PM  Result Value Ref Range   Lactic Acid, Venous 2.6 (HH) 0.5 - 1.9 mmol/L    Comment: CRITICAL VALUE NOTED. VALUE IS CONSISTENT WITH PREVIOUSLY REPORTED/CALLED VALUE Performed at [facility], [address] [place], [place] [address]   Troponin I (High Sensitivity)     Status: None   Collection Time: 03/03/23  5:30 PM  Result Value Ref Range   Troponin I (High Sensitivity) 5 <18 ng/L    Comment: (NOTE) Elevated high sensitivity troponin I (hsTnI) values and significant  changes across serial measurements may suggest ACS but many other  chronic and acute conditions are known to elevate hsTnI results.  Refer to the "Links" section for chest pain algorithms and additional  guidance. Performed at [facility], [address] [name], [place] [address]    *Note: Due to a large number of results and/or encounters for the requested time period, some results have not been displayed. A complete set of results can be found in Results Review.    CT ABDOMEN PELVIS W CONTRAST  Result Date: 03/03/2023 CLINICAL DATA:  Bowel obstruction suspected Patient with known history of metastatic colon cancer. EXAM: CT ABDOMEN AND PELVIS WITH CONTRAST TECHNIQUE: Multidetector CT imaging of the abdomen and pelvis was performed using the standard protocol following bolus administration of intravenous contrast. RADIATION DOSE REDUCTION: This exam was performed according to the departmental dose-optimization program which includes automated exposure control, adjustment of the mA and/or kV according to patient size and/or use of iterative reconstruction technique. CONTRAST:  75mL OMNIPAQUE IOHEXOL 350 MG/ML SOLN COMPARISON:  CT 02/14/2023 FINDINGS: Lower chest: Assessed on concurrent chest CT, reported separately. Hepatobiliary: Again seen known hepatic metastatic disease. Masses in the left lobe have coalesced from prior exam with slight increase in size, exact measurements difficult due to the infiltrative nature. Representative right lobe lesion measuring 2.9 x 2.1 cm series 4, image 22, previously measured 2.5 x 2.1 cm. There is no obvious portal vein invasion or thrombus. Small gallstones within partially distended gallbladder. No biliary dilatation. Small amount of perihepatic ascites is new. Pancreas: No ductal dilatation or inflammation. Spleen: Normal in size without focal abnormality. Small amount of perisplenic ascites, new. Adrenals/Urinary Tract: Exophytic 12 mm right adrenal nodule versus adjacent lymph node is grossly stable. Normal left adrenal gland. Punctate right and 5 mm nonobstructing left renal calculi. No hydronephrosis. There is no perinephric inflammation. Symmetric  excretion on delayed phase imaging. Unremarkable urinary bladder. Stomach/Bowel: Lack of enteric contrast limits detailed bowel assessment. Nondistended stomach. No small bowel dilatation or evidence of obstruction. Enteric sutures in the sigmoid. Small to moderate volume of stool in  colon. There is no evidence of colonic obstruction or inflammation. Vascular/Lymphatic: Aortic atherosclerosis. No aneurysm. Again seen multifocal adenopathy, progressed. Representative portal caval node measures 4.5 x 2.6 cm, series 4, image 35, previously 4.2 x 2.2 retrocaval nodal conglomerate measures 4.2 x 2.2 cm series 4, image 41, previously 3.5 x 1.8 cm. Cm. Reproductive: Prominent prostate again seen. Other: Significant progression in peritoneal nodularity an wispy soft tissue density in the anterior omentum. There previous 10 mm soft tissue nodule is now obscured by diffuse omental soft tissue density in thickening. Small to moderate volume ascites in the abdomen and pelvis is new. Musculoskeletal: Posterior lumbar fusion hardware. No evidence of focal bone lesion or acute osseous findings. IMPRESSION: 1. No bowel obstruction. 2. Known metastatic disease with progression. This includes development of diffuse omental caking and new small to moderate abdominopelvic ascites over the last 17 days. Progression in known hepatic and nodal metastatic disease. 3. Gallstones and nonobstructive renal calculi. Aortic Atherosclerosis (ICD10-I70.0). Electronically Signed   By: Narda Rutherford M.D.   On: 03/03/2023 19:15   CT Angio Chest PE W and/or Wo Contrast  Result Date: 03/03/2023 CLINICAL DATA:  Pulmonary embolism (PE) suspected, high prob Shortness of breath. Review of radiologic records indicates history of metastatic colon cancer. EXAM: CT ANGIOGRAPHY CHEST WITH CONTRAST TECHNIQUE: Multidetector CT imaging of the chest was performed using the standard protocol during bolus administration of intravenous contrast. Multiplanar CT image reconstructions and MIPs were obtained to evaluate the vascular anatomy. RADIATION DOSE REDUCTION: This exam was performed according to the departmental dose-optimization program which includes automated exposure control, adjustment of the mA and/or kV according to patient size  and/or use of iterative reconstruction technique. CONTRAST:  75mL OMNIPAQUE IOHEXOL 350 MG/ML SOLN COMPARISON:  Radiograph earlier today.  Chest CT 02/14/2023 FINDINGS: Cardiovascular: There are no filling defects within the pulmonary arteries to suggest pulmonary embolus. Mediastinal adenopathy causes mass effect on the right middle lobar pulmonary artery but no filling defects. Borderline cardiomegaly. Small pericardial effusion. No acute aortic findings. Right chest port in place. Mediastinum/Nodes: Multifocal mediastinal and hilar adenopathy, with progression from prior exam. For example, subcarinal node measures 2.5 cm short axis series 2, image 62, previously 2.2 cm. Right hilar node measures 1.8 cm short axis series 2, image 59, previously 1.6 cm. Right anterior pericardial node measures 4.1 x 2.5 cm, series 2, image 93, previously 4 x 2.5 cm. There is enlargement of multiple additional mediastinal and hilar nodes. No esophageal wall thickening. Lungs/Pleura: Pulmonary metastatic disease again seen, with progression from prior. Many of the previous nodules and masses have now coalesced making exact measurement difficult. This includes a right middle lobe masslike conglomerate measuring 4.1 x 5.8 cm, series 10, image 66, previously 4 x 5.1 cm on my remeasurement. Left lower lobe mass measures 3.4 x 2.9 cm, series 10, image 64, previously 3.4 x 2 6 cm. Many of the smaller nodules have also increased in size. There is a trace right pleural effusion, new. Upper Abdomen: Assessed on concurrent abdominopelvic CT, reported separately. Musculoskeletal: No acute findings or evidence of osseous metastatic disease. Review of the MIP images confirms the above findings. IMPRESSION: 1. No pulmonary embolus. 2. Known intrathoracic metastatic disease with progression over the last 17 days. Enlargement of thoracic  lymph nodes and pulmonary metastasis. 3. Trace right pleural effusion, new. Small pericardial effusion.  Electronically Signed   By: Narda Rutherford M.D.   On: 03/03/2023 19:07   DG Chest 2 View  Result Date: 03/03/2023 CLINICAL DATA:  Shortness of breath. EXAM: CHEST - 2 VIEW COMPARISON:  06/08/2022. FINDINGS: There are at least 3 lung masses with largest in the middle lobe measuring up to 2.8 x 4.8 cm, compatible with metastasis. There is interval increase in the size when compared to the prior radiograph from 06/08/2022. Bilateral lung fields are otherwise clear. No acute consolidation or lung collapse. Bilateral costophrenic angles are clear. Stable cardio-mediastinal silhouette. No acute osseous abnormalities. The soft tissues are within normal limits. Redemonstration of right-sided CT Port-A-Cath with its tip overlying the lower portion of superior vena cava, unchanged. IMPRESSION: 1. Interval increase in size of pulmonary metastasis. 2. No acute cardiopulmonary abnormality. Electronically Signed   By: Jules Schick M.D.   On: 03/03/2023 16:31    Pending Labs Unresulted Labs (From admission, onward)     Start     Ordered   03/04/23 0500  Comprehensive metabolic panel  Tomorrow morning,   R        03/03/23 2009   03/04/23 0500  CBC  Tomorrow morning,   R        03/03/23 2009   03/03/23 1453  Blood culture (routine x 2)  BLOOD CULTURE X 2,   R (with STAT occurrences)      03/03/23 1452            Vitals/Pain Today's Vitals   03/03/23 1700 03/03/23 1842 03/03/23 1845 03/03/23 1926  BP: 106/78 (!) 113/53 116/78   Pulse: (!) 109 (!) 106 (!) 107   Resp: 18 18 18    Temp:  100.1 F (37.8 C)    TempSrc:      SpO2: 100% 100% 100%   Weight:      Height:      PainSc:    10-Worst pain ever    Isolation Precautions No active isolations  Medications Medications  metroNIDAZOLE (FLAGYL) IVPB 500 mg (has no administration in time range)  sodium chloride 0.9 % bolus 1,000 mL (has no administration in time range)  0.9 %  sodium chloride infusion (has no administration in time range)   oxyCODONE (OXYCONTIN) 12 hr tablet 15 mg (has no administration in time range)  oxyCODONE-acetaminophen (PERCOCET) 10-325 MG per tablet 1 tablet (has no administration in time range)  atorvastatin (LIPITOR) tablet 80 mg (has no administration in time range)  metoprolol tartrate (LOPRESSOR) tablet 100 mg (has no administration in time range)  mirtazapine (REMERON) tablet 15 mg (has no administration in time range)  levothyroxine (SYNTHROID) tablet 137 mcg (has no administration in time range)  pantoprazole (PROTONIX) EC tablet 40 mg (has no administration in time range)  tamsulosin (FLOMAX) capsule 0.4 mg (has no administration in time range)  apixaban (ELIQUIS) tablet 5 mg (has no administration in time range)  levETIRAcetam (KEPPRA) tablet 750 mg (has no administration in time range)  insulin aspart (novoLOG) injection 0-15 Units (has no administration in time range)  insulin aspart (novoLOG) injection 0-5 Units (has no administration in time range)  acetaminophen (TYLENOL) tablet 650 mg (has no administration in time range)    Or  acetaminophen (TYLENOL) suppository 650 mg (has no administration in time range)  fentaNYL (SUBLIMAZE) injection 12.5-50 mcg (has no administration in time range)  traZODone (DESYREL) tablet 25 mg (has no administration  in time range)  ondansetron (ZOFRAN) tablet 4 mg (has no administration in time range)    Or  ondansetron (ZOFRAN) injection 4 mg (has no administration in time range)  albuterol (PROVENTIL) (2.5 MG/3ML) 0.083% nebulizer solution 2.5 mg (has no administration in time range)  ceFEPIme (MAXIPIME) 2 g in sodium chloride 0.9 % 100 mL IVPB (0 g Intravenous Stopped 03/03/23 1557)  metroNIDAZOLE (FLAGYL) IVPB 500 mg (0 mg Intravenous Stopped 03/03/23 1804)  vancomycin (VANCOCIN) IVPB 1000 mg/200 mL premix (0 mg Intravenous Stopped 03/03/23 1906)  sodium chloride 0.9 % bolus 1,000 mL (0 mLs Intravenous Stopped 03/03/23 1627)  ondansetron (ZOFRAN)  injection 4 mg (4 mg Intravenous Given 03/03/23 1730)  iohexol (OMNIPAQUE) 350 MG/ML injection 100 mL (75 mLs Intravenous Contrast Given 03/03/23 1748)  sodium chloride 0.9 % bolus 1,000 mL (0 mLs Intravenous Stopped 03/03/23 1927)  fentaNYL (SUBLIMAZE) injection 50 mcg (50 mcg Intravenous Given 03/03/23 1958)    Mobility Walks independently     R Report given to:

## 2023-03-03 NOTE — ED Provider Notes (Signed)
I provided a substantive portion of the care of this patient.  I personally made/approved the management plan for this patient and take responsibility for the patient management.  EKG Interpretation Date/Time:  Friday March 03 2023 14:13:51 EDT Ventricular Rate:  156 PR Interval:  123 QRS Duration:  64 QT Interval:  293 QTC Calculation: 472 R Axis:   43  Text Interpretation: Sinus tachycardia Ventricular premature complex Aberrant conduction of SV complex(es) Low voltage, precordial leads ST depression, probably rate related Since last tracing rate faster Confirmed by Linwood Dibbles [phone]) on 03/03/2023 2:18:49 PM  3:55 PM patient presented to the ED with complaints of nausea fatigue.  Notably tachycardic on arrival.  Labs are currently pending but his initial CBC does show significant leukocytosis.  Metabolic panel does show increased BUN and creatinine and elevated anion gap.  On my exam now patient is feeling better after IV fluid hydration.  Still tachycardic but heart rate down to 111.  Treating empirically for possible evolving sepsis.  Blood pressure remains normal and patient clinically appears better.  Will continue to monitor closely.   Linwood Dibbles, MD 03/03/23 1556

## 2023-03-03 NOTE — Progress Notes (Signed)
A consult was received from an ED physician for vancomycin and cefepime per pharmacy dosing (for an indication other than meningitis). The patient's profile has been reviewed for ht/wt/allergies/indication/available labs. A one time order has been placed for the above antibiotics.  Further antibiotics/pharmacy consults should be ordered by admitting physician if indicated.                       Bernadene Person, PharmD, BCPS [phone] 03/03/2023, 2:58 PM

## 2023-03-03 NOTE — Plan of Care (Signed)
Discussed with patient plan of care for the evening, pain management and admission questions with some teach back displayed.  What is important to the patient is rest.  Problem: Education: Goal: Ability to describe self-care measures that may prevent or decrease complications (Diabetes Survival Skills Education) will improve Outcome: Progressing   Problem: Pain Managment: Goal: General experience of comfort will improve Outcome: Progressing

## 2023-03-03 NOTE — Progress Notes (Signed)
Pharmacy Antibiotic Note  Jeff Jeff [name] is Jeff [name] 57 y.o. male with recent progression of metastatic colon cancer, admitted on 03/03/2023 with weakness, nausea, and anorexia.  Pharmacy has been consulted for vancomycin and cefepime dosing for sepsis. Noted to be in AKI upon admission (baseline SCr ~1.0)  Plan: Vancomycin 1000 mg IV now, then 1500 mg IV q24 hr (est AUC 484 based on SCr 1.69; Vd 0.72) Measure vancomycin AUC at steady state as indicated SCr q48 while on vanc Cefepime 2 g IV q12 hr Flagyl per MD; dosing appropriate   Height: 5\' 10"  (177.8 cm) Weight: 93 kg (205 lb) IBW/kg (Calculated) : 73  Temp (24hrs), Avg:99.9 F (37.7 C), Min:99.6 F (37.6 C), Max:100.1 F (37.8 C)  Recent Labs  Lab 03/03/23 1444 03/03/23 1515 03/03/23 1730  WBC 18.7*  --   --   CREATININE 1.69*  --   --   LATICACIDVEN  --  4.5* 2.6*    Estimated Creatinine Clearance: 55.9 mL/min (Jeff [name]) (by C-G formula based on SCr of 1.69 mg/dL (H)).    Allergies  Allergen Reactions   Hydrocodone Hives    Antimicrobials this admission: 10/18 vancomycin >>  10/18 cefepime >>  10/18 metronidazole >>  Dose adjustments this admission: N/Jeff [name]  Microbiology results: 10/18 BCx: sent   Thank you for allowing pharmacy to be Jeff [name] part of this patient's care.  Jeff Jeff [name] 03/03/2023 8:39 PM

## 2023-03-03 NOTE — ED Triage Notes (Signed)
Pt is a colon cancer pt. Pt complaining nausea/emesis, and fatigue that has been increasing over the last couple of weeks. HR 154 in triage, denies any pain/diarrhea.

## 2023-03-03 NOTE — H&P (Signed)
History and Physical  Jeff [name] WUJ:[id number] DOB: Dec 16, 1965 DOA: 03/03/2023  PCP: Simone Curia, MD   Chief Complaint: Nausea and fatigue  HPI: Jeff [name] is a 57 y.o. male with medical history significant for  iatrogenic hypothyroidism, hypertension, depression, epilepsy, metastatic colon adenocarcinoma being admitted to the hospital with severe sepsis.  Patient was seen by his oncologist Dr. Truett Perna recently, and reviewed his recent CT scan which unfortunately showed progression of his metastatic colon cancer despite FOLFOX chemotherapy.  Over the last few days, patient has had progressive weakness, nausea, lack of appetite.  Denies any fevers, but has had a bit of a cough.  No sick contacts, no chills, no changes in bowel or bladder habits.  Due to continued symptoms as above, he decided to come to the ER today for evaluation.  ED Course: In the emergency department he has been persistently tachycardic, though it is improved after IV fluid bolus.  Temperature 100.1.  Blood pressure on the low side but stable, and saturating well on room air.  Lab work significant for WBC 19, hemoglobin stable at 9.8, creatinine 1.69, alk phos 164, initial lactate 4.5, repeat 2.6 after 2 L of IV fluid.  Due to concern for possible sepsis, patient was given empiric IV antibiotics, and hospitalist was contacted for admission.  Review of Systems: Please see HPI for pertinent positives and negatives. A complete 10 system review of systems are otherwise negative.  Past Medical History:  Diagnosis Date   Allergic rhinitis    Anxiety    Arthritis    Chemotherapy-induced thrombocytopenia    Chronic nausea    mild intermittant and loose stools due to colon cancer   Chronic neck and back pain    Depression    ED (erectile dysfunction)    Generalized idiopathic epilepsy and epileptic syndromes, not intractable, without status epilepticus (HCC) 10/25/2019   (11-10-2020 per pt wife last seizure 11/  2021)  neurologist-- dr Karel Jarvis---  seizure activity/ DKA ;  EEG w/ evidence epileto genicity arising right frontocentral region;  taking keppra   GERD (gastroesophageal reflux disease)    Hiatal hernia    History of 2019 novel coronavirus disease (COVID-19) 12/13/2018   per pt wife , pt had moderate symtptoms that resolved   History of Graves' disease    s/p RAI 02/ 2012;  previous seen by endocrinologist--- dr Everardo All   History of kidney stones    History of non-ST elevation myocardial infarction (NSTEMI)    Jan 2014--  no CAD;  per notes probable coronary vasospasm   History of panic attacks    History of thyroid storm    Nov 2011 w/ grave's ,  s/p RAI 02/ 2012   Hyperlipidemia    Hypertension    Hypothyroidism following radioiodine therapy    RAI in 02/  2012---  previously followed by dr Everardo All,  now followed by pcp   Lower urinary tract symptoms (LUTS)    Metastatic colon cancer to liver (HCC) 11/2015   oncologist-- [facility]/ and seen by dr a. Maisie Fus;  01-14-2016 s/p sigmoid colectomy, liver bx, node dissection, PAC;   chemo started 09/ 2017  ;  04/ 2021 metastatic pulmonary nodules;   active treatment every 2 wks   Nephrolithiasis    left side non-obstructive per imaging 04/ 2022   PAD (peripheral artery disease) [facility]) cardiologist-  dr Kirke Corin   cardiologist ----  dr Judie Petit. Kirke Corin ;   left popliteal occlusion behind knee w/ collaterals;  s/p attempted  angioplasty 01/ 2016;  09-11-2015 s/p pop-TPT w/ ipsNR GSV   PONV (postoperative nausea and vomiting)    Rash    due to chemo   Type 2 diabetes mellitus treated with insulin (HCC)    followed by pcp----   (11-10-2020  per pt wife blood sugar check's 2-4 times daily,  fasting sugar--- 130--168)   Unspecified fracture of left foot, initial encounter for closed fracture 10/17/2020   midfoot   Past Surgical History:  Procedure Laterality Date   ABDOMINAL AORTAGRAM N/A 02/19/2014   Procedure: ABDOMINAL Ronny Flurry;  Surgeon: Iran Ouch, MD;  Location: [facility] CATH [facility];  Service: Cardiovascular;  Laterality: N/A;   ANTERIOR CERVICAL DECOMP/DISCECTOMY FUSION  09/08/2010   C3 -4   CYSTOSCOPY W/ URETERAL STENT PLACEMENT Right 01/04/2015   Procedure: CYSTOSCOPY WITH RETROGRADE PYELOGRAM/URETERAL STENT PLACEMENT;  Surgeon: Marcine Matar, MD;  Location: [facility] OR;  Service: Urology;  Laterality: Right;   CYSTOSCOPY W/ URETERAL STENT REMOVAL Right 01/26/2015   Procedure: CYSTOSCOPY WITH STENT REMOVAL;  Surgeon: Marcine Matar, MD;  Location: [facility];  Service: Urology;  Laterality: Right;   CYSTOSCOPY/URETEROSCOPY/HOLMIUM LASER/STENT PLACEMENT Right 01/26/2015   Procedure: CYSTOSCOPY/URETEROSCOPY RIGHT;  Surgeon: Marcine Matar, MD;  Location: [facility];  Service: Urology;  Laterality: Right;   FEMORAL-POPLITEAL BYPASS GRAFT Left 09/11/2015   Procedure: BYPASS GRAFT LEFT ABOVE KNEE TO BELOW KNEE POPLITEAL ARTERY WITH LEFT GREATER SAPHENOUS VEIN;  Surgeon: Nada Libman, MD;  Location: [facility] OR;  Service: Vascular;  Laterality: Left;   INCISIONAL HERNIA REPAIR N/A 01/19/2016   Procedure: HERNIA REPAIR INCISIONAL;  Surgeon: Romie Levee, MD;  Location: [facility] ORS;  Service: General;  Laterality: N/A;   LAMINECTOMY AND MICRODISCECTOMY LUMBAR SPINE  03/26/2009   left L5 -- S1   LAPAROSCOPIC SIGMOID COLECTOMY N/A 01/14/2016   Procedure: LAPAROSCOPIC SIGMOID COLECTOMY;  Surgeon: Romie Levee, MD;  Location: [facility] ORS;  Service: General;  Laterality: N/A;   LEFT HEART CATHETERIZATION WITH CORONARY ANGIOGRAM N/A 06/12/2012   Procedure: LEFT HEART CATHETERIZATION WITH CORONARY ANGIOGRAM;  Surgeon: Wendall Stade, MD;  Location: [facility] CATH LAB;  Service: Cardiovascular;  Laterality: N/A;   No sig. CAD/  normal LVF, ef 55-65%   LIVER BIOPSY N/A 01/14/2016   Procedure: LIVER BIOPSY;  Surgeon: Romie Levee, MD;  Location: [facility] ORS;  Service: General;  Laterality: N/A;   LOWER EXTREMITY ANGIOGRAM Left 05/28/2014    Procedure: LOWER EXTREMITY ANGIOGRAM;  Surgeon: Iran Ouch, MD;  Location: [facility] CATH [facility];  Service: Cardiovascular;  Laterality: Left;  Failed PTA CTO   OPEN REDUCTION INTERNAL FIXATION (ORIF) FOOT LISFRANC FRACTURE Left 11/11/2020   Procedure: OPEN REDUCTION INTERNAL FIXATION (ORIF) FOOT LISFRANC FRACTURE;  Surgeon: Park Liter, DPM;  Location: [facility];  Service: Podiatry;  Laterality: Left;  General with Regional Block   POPLITEAL ARTERY ANGIOPLASTY Left 05/28/2014    dr Kirke Corin   Attempted and unsuccessful due to inability to cross the occlusionnotes    PORTACATH PLACEMENT Right 01/14/2016   Procedure: INSERTION PORT-A-CATH;  Surgeon: Romie Levee, MD;  Location: [facility] ORS;  Service: General;  Laterality: Right;   POSTERIOR CERVICAL FUSION/FORAMINOTOMY N/A 12/10/2012   Procedure: POSTERIOR CERVICAL FUSION/FORAMINOTOMY LEVEL 1;  Surgeon: Cristi Loron, MD;  Location: [facility];  Service: Neurosurgery;  Laterality: N/A;  Cervical three-four posterior cervical fusion with lateral mass screws   POSTERIOR LUMBAR FUSION  11-11-2009;   07-15-2013   L5 -- S1;   L4-- S1  SHOULDER ARTHROSCOPY Right 03/08/2004   debridement labral tear/  DCR/  acromioplasty   UMBILICAL HERNIA REPAIR  1980   VEIN HARVEST Left 09/11/2015   Procedure: LEFT GREATER SAPHENOUS VEIN HARVEST;  Surgeon: Nada Libman, MD;  Location: [facility] OR;  Service: Vascular;  Laterality: Left;    Social History:  reports that he has never smoked. He has never used smokeless tobacco. He reports current alcohol use. He reports that he does not use drugs.   Allergies  Allergen Reactions   Hydrocodone Hives    Family History  Problem Relation Age of Onset   Thyroid disease Mother        hypothyroidism   Heart attack Maternal Grandfather    Heart Problems Father        pacermaker   Edema Father    Heart disease Maternal Grandmother    Lung cancer Maternal Grandmother    Diabetes Maternal Grandmother     Hypertension Maternal Grandmother      Prior to Admission medications   Medication Sig Start Date End Date Taking? Authorizing Provider  ACCU-CHEK GUIDE test strip CHECK BLOOD SUGAR THREE TIMES DAILY AS DIRECTED 08/14/20   [provider]  ACCU-CHEK SOFTCLIX LANCETS lancets USE  TID TO CHECK BLOOD SUGAR LEVELS 06/22/16   [provider]  amLODipine (NORVASC) 5 MG tablet Take 1 tablet (5 mg total) by mouth daily. 11/15/22 02/21/23  Iran Ouch, MD  apixaban (ELIQUIS) 5 MG TABS tablet Take 1 tablet (5 mg total) by mouth 2 (two) times daily. 11/18/22   Iran Ouch, MD  atorvastatin (LIPITOR) 80 MG tablet Take 1 tablet (80 mg total) by mouth daily. TAKE 1 TABLET(80 MG) BY MOUTH AT BEDTIME 03/14/22   Meng, [place], PA  benzonatate (TESSALON) 100 MG capsule TAKE 1 CAPSULE BY MOUTH 2 TIMES DAILY AS NEEDED FOR COUGH. 02/23/23   Ladene Artist, MD  dapagliflozin propanediol (FARXIGA) 10 MG TABS tablet Take 10 mg by mouth daily.    [provider]  diphenoxylate-atropine (LOMOTIL) 2.5-0.025 MG tablet Take 1-2 tablets by mouth 4 (four) times daily as needed for diarrhea or loose stools. Patient not taking: Reported on 01/24/2023 06/06/22   Rana Snare, NP  docusate sodium (COLACE) 100 MG capsule Take 100 mg by mouth 2 (two) times daily.    [provider]  GLOBAL EASE INJECT PEN NEEDLES 31G X 5 MM MISC Inject 1 Syringe into the skin 4 (four) times daily. 09/18/20   [provider]  Insulin Degludec (TRESIBA FLEXTOUCH [place]) Inject 80 Units into the skin daily.    [provider]  levETIRAcetam (KEPPRA) 750 MG tablet Take 1 tablet twice a day 11/15/22   Van Clines, MD  levothyroxine (SYNTHROID) 137 MCG tablet Take 137 mcg by mouth daily before breakfast. 06/01/22   [provider]  lidocaine-prilocaine (EMLA) cream Apply 1 Application topically as needed. Apply to portacath site 1 hour prior to use 10/11/22   Rana Snare, NP  LORazepam (ATIVAN)  0.5 MG tablet Take 1 tablet (0.5 mg total) by mouth every 8 (eight) hours as needed for anxiety. 12/27/22   Rana Snare, NP  meclizine (ANTIVERT) 25 MG tablet Take 25 mg by mouth 3 (three) times daily as needed for dizziness. 02/25/16   [provider]  metFORMIN (GLUCOPHAGE-XR) 500 MG 24 hr tablet Take 1,000 mg by mouth in the morning and at bedtime. 10/30/16   [provider]  metoprolol tartrate (LOPRESSOR) 100 MG  tablet Take 100 mg by mouth 2 (two) times daily.    [provider]  mirtazapine (REMERON) 15 MG tablet Take 1 tablet (15 mg total) by mouth at bedtime. 02/07/23   Rana Snare, NP  nitroGLYCERIN (NITROSTAT) 0.4 MG SL tablet DISSOLVE 1 TABLET UNDER TONGUE EVERY 5 MINUTES AS NEEDED FOR CHEST PAIN. IF NO RELIEF AFTER 3 DOSES CALL 911. Patient not taking: Reported on 01/10/2023 06/16/21   Iran Ouch, MD  ondansetron (ZOFRAN) 8 MG tablet Take 1 tablet (8 mg total) by mouth every 8 (eight) hours as needed for nausea or vomiting. Patient not taking: Reported on 02/21/2023 03/28/22   Rana Snare, NP  oxyCODONE ER [facility] ER) 13.5 MG C12A Take 13.5 mg by mouth every 12 (twelve) hours. 10/19/21   [provider]  oxyCODONE-acetaminophen (PERCOCET) 10-325 MG tablet Take 1 tablet by mouth every 6 (six) hours as needed. 12/02/20   [provider]  pantoprazole (PROTONIX) 40 MG tablet Take 40 mg by mouth 2 (two) times daily. 10/23/15   [provider]  potassium chloride SA (KLOR-CON M) 20 MEQ tablet TAKE 1 TABLET BY MOUTH DAILY. 12/09/22   Ladene Artist, MD  promethazine (PHENERGAN) 12.5 MG tablet Take 1 tablet (12.5 mg total) by mouth every 6 (six) hours as needed. 12/27/22   Rana Snare, NP  sildenafil (VIAGRA) 100 MG tablet Take 100 mg by mouth daily as needed. 04/21/21   [provider]  tamsulosin (FLOMAX) 0.4 MG CAPS capsule Take 0.4 mg by mouth 2 (two) times daily.    [provider]  tiZANidine (ZANAFLEX) 4 MG  tablet Take 4 mg by mouth 3 (three) times daily. 07/22/21   [provider]  venlafaxine XR (EFFEXOR-XR) 75 MG 24 hr capsule Take 225 mg by mouth daily. 09/09/19   [provider]  Vitamin D, Ergocalciferol, (DRISDOL) 1.25 MG (50000 UNIT) CAPS capsule Take 50,000 Units by mouth once a week. 01/15/22   [provider]    Physical Exam: BP 116/78   Pulse (!) 107   Temp 100.1 F (37.8 C)   Resp 18   Ht 5\' 10"  (1.778 m)   Wt 93 kg   SpO2 100%   BMI 29.41 kg/m   General:  Alert, oriented, calm, in no acute distress, lying on a stretcher in the ER, looks comfortable and nontoxic Cardiovascular: RRR, no murmurs or rubs, no peripheral edema  Respiratory: clear to auscultation bilaterally, no wheezes, no crackles  Abdomen: soft, tender to deep palpation in the right upper quadrant, nondistended, normal bowel tones heard  Skin: dry, no rashes  Musculoskeletal: no joint effusions, normal range of motion  Psychiatric: appropriate affect, normal speech  Neurologic: extraocular muscles intact, clear speech, moving all extremities with intact sensorium         Labs on Admission:  Basic Metabolic Panel: Recent Labs  Lab 03/03/23 1444  NA 131*  K 4.5  CL 97*  CO2 18*  GLUCOSE 178*  BUN 16  CREATININE 1.69*  CALCIUM 8.3*   Liver Function Tests: Recent Labs  Lab 03/03/23 1444  AST 63*  ALT 34  ALKPHOS 164*  BILITOT 0.9  PROT 8.0  ALBUMIN 2.5*   Recent Labs  Lab 03/03/23 1444  LIPASE 24   No results for input(s): "AMMONIA" in the last 168 hours. CBC: Recent Labs  Lab 03/03/23 1444  WBC 18.7*  NEUTROABS 13.4*  HGB 9.8*  HCT 31.6*  MCV 88.0  PLT 238  Cardiac Enzymes: No results for input(s): "CKTOTAL", "CKMB", "CKMBINDEX", "TROPONINI" in the last 168 hours.  BNP (last 3 results) No results for input(s): "BNP" in the last 8760 hours.  ProBNP (last 3 results) No results for input(s): "PROBNP" in the last 8760 hours.  CBG: No results for  input(s): "GLUCAP" in the last 168 hours.  Radiological Exams on Admission: CT ABDOMEN PELVIS W CONTRAST  Result Date: 03/03/2023 CLINICAL DATA:  Bowel obstruction suspected Patient with known history of metastatic colon cancer. EXAM: CT ABDOMEN AND PELVIS WITH CONTRAST TECHNIQUE: Multidetector CT imaging of the abdomen and pelvis was performed using the standard protocol following bolus administration of intravenous contrast. RADIATION DOSE REDUCTION: This exam was performed according to the departmental dose-optimization program which includes automated exposure control, adjustment of the mA and/or kV according to patient size and/or use of iterative reconstruction technique. CONTRAST:  75mL OMNIPAQUE IOHEXOL 350 MG/ML SOLN COMPARISON:  CT 02/14/2023 FINDINGS: Lower chest: Assessed on concurrent chest CT, reported separately. Hepatobiliary: Again seen known hepatic metastatic disease. Masses in the left lobe have coalesced from prior exam with slight increase in size, exact measurements difficult due to the infiltrative nature. Representative right lobe lesion measuring 2.9 x 2.1 cm series 4, image 22, previously measured 2.5 x 2.1 cm. There is no obvious portal vein invasion or thrombus. Small gallstones within partially distended gallbladder. No biliary dilatation. Small amount of perihepatic ascites is new. Pancreas: No ductal dilatation or inflammation. Spleen: Normal in size without focal abnormality. Small amount of perisplenic ascites, new. Adrenals/Urinary Tract: Exophytic 12 mm right adrenal nodule versus adjacent lymph node is grossly stable. Normal left adrenal gland. Punctate right and 5 mm nonobstructing left renal calculi. No hydronephrosis. There is no perinephric inflammation. Symmetric excretion on delayed phase imaging. Unremarkable urinary bladder. Stomach/Bowel: Lack of enteric contrast limits detailed bowel assessment. Nondistended stomach. No small bowel dilatation or evidence of  obstruction. Enteric sutures in the sigmoid. Small to moderate volume of stool in colon. There is no evidence of colonic obstruction or inflammation. Vascular/Lymphatic: Aortic atherosclerosis. No aneurysm. Again seen multifocal adenopathy, progressed. Representative portal caval node measures 4.5 x 2.6 cm, series 4, image 35, previously 4.2 x 2.2 retrocaval nodal conglomerate measures 4.2 x 2.2 cm series 4, image 41, previously 3.5 x 1.8 cm. Cm. Reproductive: Prominent prostate again seen. Other: Significant progression in peritoneal nodularity an wispy soft tissue density in the anterior omentum. There previous 10 mm soft tissue nodule is now obscured by diffuse omental soft tissue density in thickening. Small to moderate volume ascites in the abdomen and pelvis is new. Musculoskeletal: Posterior lumbar fusion hardware. No evidence of focal bone lesion or acute osseous findings. IMPRESSION: 1. No bowel obstruction. 2. Known metastatic disease with progression. This includes development of diffuse omental caking and new small to moderate abdominopelvic ascites over the last 17 days. Progression in known hepatic and nodal metastatic disease. 3. Gallstones and nonobstructive renal calculi. Aortic Atherosclerosis (ICD10-I70.0). Electronically Signed   By: Narda Rutherford M.D.   On: 03/03/2023 19:15   CT Angio Chest PE W and/or Wo Contrast  Result Date: 03/03/2023 CLINICAL DATA:  Pulmonary embolism (PE) suspected, high prob Shortness of breath. Review of radiologic records indicates history of metastatic colon cancer. EXAM: CT ANGIOGRAPHY CHEST WITH CONTRAST TECHNIQUE: Multidetector CT imaging of the chest was performed using the standard protocol during bolus administration of intravenous contrast. Multiplanar CT image reconstructions and MIPs were obtained to evaluate the vascular anatomy. RADIATION DOSE REDUCTION: This exam was  performed according to the departmental dose-optimization program which includes  automated exposure control, adjustment of the mA and/or kV according to patient size and/or use of iterative reconstruction technique. CONTRAST:  75mL OMNIPAQUE IOHEXOL 350 MG/ML SOLN COMPARISON:  Radiograph earlier today.  Chest CT 02/14/2023 FINDINGS: Cardiovascular: There are no filling defects within the pulmonary arteries to suggest pulmonary embolus. Mediastinal adenopathy causes mass effect on the right middle lobar pulmonary artery but no filling defects. Borderline cardiomegaly. Small pericardial effusion. No acute aortic findings. Right chest port in place. Mediastinum/Nodes: Multifocal mediastinal and hilar adenopathy, with progression from prior exam. For example, subcarinal node measures 2.5 cm short axis series 2, image 62, previously 2.2 cm. Right hilar node measures 1.8 cm short axis series 2, image 59, previously 1.6 cm. Right anterior pericardial node measures 4.1 x 2.5 cm, series 2, image 93, previously 4 x 2.5 cm. There is enlargement of multiple additional mediastinal and hilar nodes. No esophageal wall thickening. Lungs/Pleura: Pulmonary metastatic disease again seen, with progression from prior. Many of the previous nodules and masses have now coalesced making exact measurement difficult. This includes a right middle lobe masslike conglomerate measuring 4.1 x 5.8 cm, series 10, image 66, previously 4 x 5.1 cm on my remeasurement. Left lower lobe mass measures 3.4 x 2.9 cm, series 10, image 64, previously 3.4 x 2 6 cm. Many of the smaller nodules have also increased in size. There is a trace right pleural effusion, new. Upper Abdomen: Assessed on concurrent abdominopelvic CT, reported separately. Musculoskeletal: No acute findings or evidence of osseous metastatic disease. Review of the MIP images confirms the above findings. IMPRESSION: 1. No pulmonary embolus. 2. Known intrathoracic metastatic disease with progression over the last 17 days. Enlargement of thoracic lymph nodes and pulmonary  metastasis. 3. Trace right pleural effusion, new. Small pericardial effusion. Electronically Signed   By: Narda Rutherford M.D.   On: 03/03/2023 19:07   DG Chest 2 View  Result Date: 03/03/2023 CLINICAL DATA:  Shortness of breath. EXAM: CHEST - 2 VIEW COMPARISON:  06/08/2022. FINDINGS: There are at least 3 lung masses with largest in the middle lobe measuring up to 2.8 x 4.8 cm, compatible with metastasis. There is interval increase in the size when compared to the prior radiograph from 06/08/2022. Bilateral lung fields are otherwise clear. No acute consolidation or lung collapse. Bilateral costophrenic angles are clear. Stable cardio-mediastinal silhouette. No acute osseous abnormalities. The soft tissues are within normal limits. Redemonstration of right-sided CT Port-A-Cath with its tip overlying the lower portion of superior vena cava, unchanged. IMPRESSION: 1. Interval increase in size of pulmonary metastasis. 2. No acute cardiopulmonary abnormality. Electronically Signed   By: Jules Schick M.D.   On: 03/03/2023 16:31    Assessment/Plan Jeff [name] is a 57 y.o. male with medical history significant for hypertension, iatrogenic hypothyroidism, depression, epilepsy, metastatic colon adenocarcinoma being admitted to the hospital with severe sepsis.   Severe sepsis-meeting criteria with leukocytosis, tachycardia, AKI, initial lactate 4.5.  Source is unclear, however there is concern for acute bacterial infection given his immunocompromise status. -Inpatient admission to stepdown -Follow blood cultures -Continue empiric IV vancomycin, IV cefepime, IV Flagyl -Lactate improving after 2 L NS bolus, will give third liter NS bolus now, and 100 cc/h thereafter  AKI-likely due to relative hypotension, and dehydration.  May also be due to sepsis. -Avoid nephrotoxins -Avoid hypotension -Hydrate as above -Follow renal function with daily labs  Leukocytosis-may be reactive due to rapidly worsening  malignancy, versus  acute bacterial infection -Treating as above, check repeat CBC in the morning  Metastatic sigmoid adenocarcinoma-now unfortunately with rapid progression despite multiple chemotherapy cycles, most recently FOLFOX and is felt to have severely limited standard treatment options.  He has been referred to Dr. Maryruth Hancock to consider enrollment in clinical trial. -Continue pain control scheduled and as needed  Lactic acidosis-potentially related to acute bacterial infection, versus rapid progression of his adenocarcinoma  Hyponatremia-likely due to relative dehydration, asymptomatic.  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 twice daily  Acquired hypothyroidism-continue Synthroid  DVT prophylaxis: Lovenox     Code Status: Full Code  Consults called: EDP discussed with oncology on-call over the weekend  Admission status: The appropriate patient status for this patient is INPATIENT. Inpatient status is judged to be reasonable and necessary in order to provide the required intensity of service to ensure the patient's safety. The patient's presenting symptoms, physical exam findings, and initial radiographic and laboratory data in the context of their chronic comorbidities is felt to place them at high risk for further clinical deterioration. Furthermore, it is not anticipated that the patient will be medically stable for discharge from the hospital within 2 midnights of admission.    I certify that at the point of admission it is my clinical judgment that the patient will require inpatient hospital care spanning beyond 2 midnights from the point of admission due to high intensity of service, high risk for further deterioration and high frequency of surveillance  required  Time spent: 59 minutes  Sonnet Rizor Sharlette Dense MD [facility] Pager [phone]  If 7PM-7AM, please contact night-coverage [email or web address] Password [email or web address]  03/03/2023, 8:09 PM

## 2023-03-03 NOTE — ED Provider Notes (Addendum)
[place] EMERGENCY DEPARTMENT AT [facility] Provider Note   CSN: [id number] Arrival date & time: 03/03/23  1351     History  Chief Complaint  Patient presents with   Emesis   Fatigue         Jeff [name] is a 57 y.o. male, history of colon cancer, last chemotherapy 4 weeks ago, who presents to the ED secondary to increased shortness of breath, intermittent chest pain, nausea, vomiting, abdominal pain has been going on for the last 2 weeks.  He states he has not been able to keep anything down, and has been attempting to have bowel movements, but states that he cannot.  He states he only has Giovonni Poirier hard stools when he does, and barely anything comes out.  Since then has had reduced p.o. intake, increased fatigue.  Denies any fevers, but does endorse some chills.  Denies any runny nose, sore throat.  Has been compliant with his Eliquis, and has not missed any doses. Reports he has dyspnea worse on exertion, and resolved by sitting.    Home Medications Prior to Admission medications   Medication Sig Start Date End Date Taking? Authorizing Provider  ACCU-CHEK GUIDE test strip CHECK BLOOD SUGAR THREE TIMES DAILY AS DIRECTED 08/14/20   [provider]  ACCU-CHEK SOFTCLIX LANCETS lancets USE  TID TO CHECK BLOOD SUGAR LEVELS 06/22/16   [provider]  amLODipine (NORVASC) 5 MG tablet Take 1 tablet (5 mg total) by mouth daily. 11/15/22 02/21/23  Iran Ouch, MD  apixaban (ELIQUIS) 5 MG TABS tablet Take 1 tablet (5 mg total) by mouth 2 (two) times daily. 11/18/22   Iran Ouch, MD  atorvastatin (LIPITOR) 80 MG tablet Take 1 tablet (80 mg total) by mouth daily. TAKE 1 TABLET(80 MG) BY MOUTH AT BEDTIME 03/14/22   Meng, [place], PA  benzonatate (TESSALON) 100 MG capsule TAKE 1 CAPSULE BY MOUTH 2 TIMES DAILY AS NEEDED FOR COUGH. 02/23/23   Ladene Artist, MD  dapagliflozin propanediol (FARXIGA) 10 MG TABS tablet Take 10 mg by mouth daily.    [provider]  diphenoxylate-atropine (LOMOTIL) 2.5-0.025 MG tablet Take 1-2 tablets by mouth 4 (four) times daily as needed for diarrhea or loose stools. Patient not taking: Reported on 01/24/2023 06/06/22   Rana Snare, NP  docusate sodium (COLACE) 100 MG capsule Take 100 mg by mouth 2 (two) times daily.    [provider]  GLOBAL EASE INJECT PEN NEEDLES 31G X 5 MM MISC Inject 1 Syringe into the skin 4 (four) times daily. 09/18/20   [provider]  Insulin Degludec (TRESIBA FLEXTOUCH [place]) Inject 80 Units into the skin daily.    [provider]  levETIRAcetam (KEPPRA) 750 MG tablet Take 1 tablet twice a day 11/15/22   Van Clines, MD  levothyroxine (SYNTHROID) 137 MCG tablet Take 137 mcg by mouth daily before breakfast. 06/01/22   [provider]  lidocaine-prilocaine (EMLA) cream Apply 1 Application topically as needed. Apply to portacath site 1 hour prior to use 10/11/22   Rana Snare, NP  LORazepam (ATIVAN) 0.5 MG tablet Take 1 tablet (0.5 mg total) by mouth every 8 (eight) hours as needed for anxiety. 12/27/22   Rana Snare, NP  meclizine (ANTIVERT) 25 MG tablet Take 25 mg by mouth 3 (three) times daily as needed for dizziness. 02/25/16   [provider]  metFORMIN (GLUCOPHAGE-XR) 500 MG 24 hr tablet Take 1,000 mg by mouth in  the morning and at bedtime. 10/30/16   [provider]  metoprolol tartrate (LOPRESSOR) 100 MG tablet Take 100 mg by mouth 2 (two) times daily.    [provider]  mirtazapine (REMERON) 15 MG tablet Take 1 tablet (15 mg total) by mouth at bedtime. 02/07/23   Rana Snare, NP  nitroGLYCERIN (NITROSTAT) 0.4 MG SL tablet DISSOLVE 1 TABLET UNDER TONGUE EVERY 5 MINUTES AS NEEDED FOR CHEST PAIN. IF NO RELIEF AFTER 3 DOSES CALL 911. Patient not taking: Reported on 01/10/2023 06/16/21   Iran Ouch, MD  ondansetron (ZOFRAN) 8 MG tablet Take 1 tablet (8 mg total) by mouth every 8 (eight) hours as needed for nausea or  vomiting. Patient not taking: Reported on 02/21/2023 03/28/22   Rana Snare, NP  oxyCODONE ER [facility] ER) 13.5 MG C12A Take 13.5 mg by mouth every 12 (twelve) hours. 10/19/21   [provider]  oxyCODONE-acetaminophen (PERCOCET) 10-325 MG tablet Take 1 tablet by mouth every 6 (six) hours as needed. 12/02/20   [provider]  pantoprazole (PROTONIX) 40 MG tablet Take 40 mg by mouth 2 (two) times daily. 10/23/15   [provider]  potassium chloride SA (KLOR-CON M) 20 MEQ tablet TAKE 1 TABLET BY MOUTH DAILY. 12/09/22   Ladene Artist, MD  promethazine (PHENERGAN) 12.5 MG tablet Take 1 tablet (12.5 mg total) by mouth every 6 (six) hours as needed. 12/27/22   Rana Snare, NP  sildenafil (VIAGRA) 100 MG tablet Take 100 mg by mouth daily as needed. 04/21/21   [provider]  tamsulosin (FLOMAX) 0.4 MG CAPS capsule Take 0.4 mg by mouth 2 (two) times daily.    [provider]  tiZANidine (ZANAFLEX) 4 MG tablet Take 4 mg by mouth 3 (three) times daily. 07/22/21   [provider]  venlafaxine XR (EFFEXOR-XR) 75 MG 24 hr capsule Take 225 mg by mouth daily. 09/09/19   [provider]  Vitamin D, Ergocalciferol, (DRISDOL) 1.25 MG (50000 UNIT) CAPS capsule Take 50,000 Units by mouth once a week. 01/15/22   [provider]      Allergies    Hydrocodone    Review of Systems   Review of Systems  Constitutional:  Negative for fever.  Respiratory:  Positive for shortness of breath. Negative for cough.   Cardiovascular:  Positive for chest pain.  Gastrointestinal:  Positive for vomiting.    Physical Exam Updated Vital Signs BP 116/78   Pulse (!) 107   Temp 100.1 F (37.8 C)   Resp 18   Ht 5\' 10"  (1.778 m)   Wt 93 kg   SpO2 100%   BMI 29.41 kg/m  Physical Exam Vitals and nursing note reviewed.  Constitutional:      General: He is not in acute distress.    Appearance: He is well-developed.  HENT:     Head: Normocephalic and  atraumatic.     Mouth/Throat:     Mouth: Mucous membranes are dry.  Eyes:     Conjunctiva/sclera: Conjunctivae normal.  Cardiovascular:     Rate and Rhythm: Regular rhythm. Tachycardia present.     Heart sounds: No murmur heard. Pulmonary:     Effort: Pulmonary effort is normal. No respiratory distress.     Breath sounds: Normal breath sounds.  Abdominal:     Palpations: Abdomen is soft.     Tenderness: There is generalized abdominal tenderness.  Musculoskeletal:        General: No swelling.  Cervical back: Neck supple.  Skin:    General: Skin is warm and dry.     Capillary Refill: Capillary refill takes less than 2 seconds.  Neurological:     Mental Status: He is alert.  Psychiatric:        Mood and Affect: Mood normal.     ED Results / Procedures / Treatments   Labs (all labs ordered are listed, but only abnormal results are displayed) Labs Reviewed  CBC WITH DIFFERENTIAL/PLATELET - Abnormal; Notable for the following components:      Result Value   WBC 18.7 (*)    RBC 3.59 (*)    Hemoglobin 9.8 (*)    HCT 31.6 (*)    RDW 17.5 (*)    Neutro Abs 13.4 (*)    Monocytes Absolute 1.9 (*)    Abs Immature Granulocytes 0.91 (*)    All other components within normal limits  COMPREHENSIVE METABOLIC PANEL - Abnormal; Notable for the following components:   Sodium 131 (*)    Chloride 97 (*)    CO2 18 (*)    Glucose, Bld 178 (*)    Creatinine, Ser 1.69 (*)    Calcium 8.3 (*)    Albumin 2.5 (*)    AST 63 (*)    Alkaline Phosphatase 164 (*)    GFR, Estimated 47 (*)    Anion gap 16 (*)    All other components within normal limits  LACTIC ACID, PLASMA - Abnormal; Notable for the following components:   Lactic Acid, Venous 4.5 (*)    All other components within normal limits  LACTIC ACID, PLASMA - Abnormal; Notable for the following components:   Lactic Acid, Venous 2.6 (*)    All other components within normal limits  RESP PANEL BY RT-PCR (RSV, FLU A&B, COVID)  [id number]   CULTURE, BLOOD (ROUTINE X 2)  CULTURE, BLOOD (ROUTINE X 2)  LIPASE, BLOOD  COMPREHENSIVE METABOLIC PANEL  CBC  TROPONIN I (HIGH SENSITIVITY)  TROPONIN I (HIGH SENSITIVITY)    EKG EKG Interpretation Date/Time:  Friday March 03 2023 14:13:51 EDT Ventricular Rate:  156 PR Interval:  123 QRS Duration:  64 QT Interval:  293 QTC Calculation: 472 R Axis:   43  Text Interpretation: Sinus tachycardia Ventricular premature complex Aberrant conduction of SV complex(es) Low voltage, precordial leads ST depression, probably rate related Since last tracing rate faster Confirmed by Linwood Dibbles [phone]) on 03/03/2023 2:18:49 PM  Radiology CT ABDOMEN PELVIS W CONTRAST  Result Date: 03/03/2023 CLINICAL DATA:  Bowel obstruction suspected Patient with known history of metastatic colon cancer. EXAM: CT ABDOMEN AND PELVIS WITH CONTRAST TECHNIQUE: Multidetector CT imaging of the abdomen and pelvis was performed using the standard protocol following bolus administration of intravenous contrast. RADIATION DOSE REDUCTION: This exam was performed according to the departmental dose-optimization program which includes automated exposure control, adjustment of the mA and/or kV according to patient size and/or use of iterative reconstruction technique. CONTRAST:  75mL OMNIPAQUE IOHEXOL 350 MG/ML SOLN COMPARISON:  CT 02/14/2023 FINDINGS: Lower chest: Assessed on concurrent chest CT, reported separately. Hepatobiliary: Again seen known hepatic metastatic disease. Masses in the left lobe have coalesced from prior exam with slight increase in size, exact measurements difficult due to the infiltrative nature. Representative right lobe lesion measuring 2.9 x 2.1 cm series 4, image 22, previously measured 2.5 x 2.1 cm. There is no obvious portal vein invasion or thrombus. Munir Victorian gallstones within partially distended gallbladder. No biliary dilatation. Iokepa Geffre amount of perihepatic ascites is  new. Pancreas: No ductal dilatation or  inflammation. Spleen: Normal in size without focal abnormality. K[place]rosse amount of perisplenic ascites, new. Adrenals/Urinary Tract: Exophytic 12 mm right adrenal nodule versus adjacent lymph node is grossly stable. Normal left adrenal gland. Punctate right and 5 mm nonobstructing left renal calculi. No hydronephrosis. There is no perinephric inflammation. Symmetric excretion on delayed phase imaging. Unremarkable urinary bladder. Stomach/Bowel: Lack of enteric contrast limits detailed bowel assessment. Nondistended stomach. No Enid Maultsby bowel dilatation or evidence of obstruction. Enteric sutures in the sigmoid. Harun Brumley to moderate volume of stool in colon. There is no evidence of colonic obstruction or inflammation. Vascular/Lymphatic: Aortic atherosclerosis. No aneurysm. Again seen multifocal adenopathy, progressed. Representative portal caval node measures 4.5 x 2.6 cm, series 4, image 35, previously 4.2 x 2.2 retrocaval nodal conglomerate measures 4.2 x 2.2 cm series 4, image 41, previously 3.5 x 1.8 cm. Cm. Reproductive: Prominent prostate again seen. Other: Significant progression in peritoneal nodularity an wispy soft tissue density in the anterior omentum. There previous 10 mm soft tissue nodule is now obscured by diffuse omental soft tissue density in thickening. Stamatia Masri to moderate volume ascites in the abdomen and pelvis is new. Musculoskeletal: Posterior lumbar fusion hardware. No evidence of focal bone lesion or acute osseous findings. IMPRESSION: 1. No bowel obstruction. 2. Known metastatic disease with progression. This includes development of diffuse omental caking and new Zaylen Susman to moderate abdominopelvic ascites over the last 17 days. Progression in known hepatic and nodal metastatic disease. 3. Gallstones and nonobstructive renal calculi. Aortic Atherosclerosis (ICD10-I70.0). Electronically Signed   By: Narda Rutherford M.D.   On: 03/03/2023 19:15   CT Angio Chest PE W and/or Wo Contrast  Result Date:  03/03/2023 CLINICAL DATA:  Pulmonary embolism (PE) suspected, high prob Shortness of breath. Review of radiologic records indicates history of metastatic colon cancer. EXAM: CT ANGIOGRAPHY CHEST WITH CONTRAST TECHNIQUE: Multidetector CT imaging of the chest was performed using the standard protocol during bolus administration of intravenous contrast. Multiplanar CT image reconstructions and MIPs were obtained to evaluate the vascular anatomy. RADIATION DOSE REDUCTION: This exam was performed according to the departmental dose-optimization program which includes automated exposure control, adjustment of the mA and/or kV according to patient size and/or use of iterative reconstruction technique. CONTRAST:  75mL OMNIPAQUE IOHEXOL 350 MG/ML SOLN COMPARISON:  Radiograph earlier today.  Chest CT 02/14/2023 FINDINGS: Cardiovascular: There are no filling defects within the pulmonary arteries to suggest pulmonary embolus. Mediastinal adenopathy causes mass effect on the right middle lobar pulmonary artery but no filling defects. Borderline cardiomegaly. Usama Harkless pericardial effusion. No acute aortic findings. Right chest port in place. Mediastinum/Nodes: Multifocal mediastinal and hilar adenopathy, with progression from prior exam. For example, subcarinal node measures 2.5 cm short axis series 2, image 62, previously 2.2 cm. Right hilar node measures 1.8 cm short axis series 2, image 59, previously 1.6 cm. Right anterior pericardial node measures 4.1 x 2.5 cm, series 2, image 93, previously 4 x 2.5 cm. There is enlargement of multiple additional mediastinal and hilar nodes. No esophageal wall thickening. Lungs/Pleura: Pulmonary metastatic disease again seen, with progression from prior. Many of the previous nodules and masses have now coalesced making exact measurement difficult. This includes a right middle lobe masslike conglomerate measuring 4.1 x 5.8 cm, series 10, image 66, previously 4 x 5.1 cm on my remeasurement. Left  lower lobe mass measures 3.4 x 2.9 cm, series 10, image 64, previously 3.4 x 2 6 cm. Many of the smaller nodules have also increased in  size. There is a trace right pleural effusion, new. Upper Abdomen: Assessed on concurrent abdominopelvic CT, reported separately. Musculoskeletal: No acute findings or evidence of osseous metastatic disease. Review of the MIP images confirms the above findings. IMPRESSION: 1. No pulmonary embolus. 2. Known intrathoracic metastatic disease with progression over the last 17 days. Enlargement of thoracic lymph nodes and pulmonary metastasis. 3. Trace right pleural effusion, new. Nika Yazzie pericardial effusion. Electronically Signed   By: Narda Rutherford M.D.   On: 03/03/2023 19:07   DG Chest 2 View  Result Date: 03/03/2023 CLINICAL DATA:  Shortness of breath. EXAM: CHEST - 2 VIEW COMPARISON:  06/08/2022. FINDINGS: There are at least 3 lung masses with largest in the middle lobe measuring up to 2.8 x 4.8 cm, compatible with metastasis. There is interval increase in the size when compared to the prior radiograph from 06/08/2022. Bilateral lung fields are otherwise clear. No acute consolidation or lung collapse. Bilateral costophrenic angles are clear. Stable cardio-mediastinal silhouette. No acute osseous abnormalities. The soft tissues are within normal limits. Redemonstration of right-sided CT Port-A-Cath with its tip overlying the lower portion of superior vena cava, unchanged. IMPRESSION: 1. Interval increase in size of pulmonary metastasis. 2. No acute cardiopulmonary abnormality. Electronically Signed   By: Jules Schick M.D.   On: 03/03/2023 16:31    Procedures Procedures    Medications Ordered in ED Medications  metroNIDAZOLE (FLAGYL) IVPB 500 mg (has no administration in time range)  sodium chloride 0.9 % bolus 1,000 mL (has no administration in time range)  0.9 %  sodium chloride infusion (has no administration in time range)  oxyCODONE (OXYCONTIN) 12 hr tablet 15  mg (has no administration in time range)  oxyCODONE-acetaminophen (PERCOCET) 10-325 MG per tablet 1 tablet (has no administration in time range)  atorvastatin (LIPITOR) tablet 80 mg (has no administration in time range)  metoprolol tartrate (LOPRESSOR) tablet 100 mg (has no administration in time range)  mirtazapine (REMERON) tablet 15 mg (has no administration in time range)  levothyroxine (SYNTHROID) tablet 137 mcg (has no administration in time range)  pantoprazole (PROTONIX) EC tablet 40 mg (has no administration in time range)  tamsulosin (FLOMAX) capsule 0.4 mg (has no administration in time range)  apixaban (ELIQUIS) tablet 5 mg (has no administration in time range)  levETIRAcetam (KEPPRA) tablet 750 mg (has no administration in time range)  insulin aspart (novoLOG) injection 0-15 Units (has no administration in time range)  insulin aspart (novoLOG) injection 0-5 Units (has no administration in time range)  acetaminophen (TYLENOL) tablet 650 mg (has no administration in time range)    Or  acetaminophen (TYLENOL) suppository 650 mg (has no administration in time range)  fentaNYL (SUBLIMAZE) injection 12.5-50 mcg (has no administration in time range)  traZODone (DESYREL) tablet 25 mg (has no administration in time range)  ondansetron (ZOFRAN) tablet 4 mg (has no administration in time range)    Or  ondansetron (ZOFRAN) injection 4 mg (has no administration in time range)  albuterol (PROVENTIL) (2.5 MG/3ML) 0.083% nebulizer solution 2.5 mg (has no administration in time range)  ceFEPIme (MAXIPIME) 2 g in sodium chloride 0.9 % 100 mL IVPB (0 g Intravenous Stopped 03/03/23 1557)  metroNIDAZOLE (FLAGYL) IVPB 500 mg (0 mg Intravenous Stopped 03/03/23 1804)  vancomycin (VANCOCIN) IVPB 1000 mg/200 mL premix (0 mg Intravenous Stopped 03/03/23 1906)  sodium chloride 0.9 % bolus 1,000 mL (0 mLs Intravenous Stopped 03/03/23 1627)  ondansetron (ZOFRAN) injection 4 mg (4 mg Intravenous Given  03/03/23 1730)  iohexol (  OMNIPAQUE) 350 MG/ML injection 100 mL (75 mLs Intravenous Contrast Given 03/03/23 1748)  sodium chloride 0.9 % bolus 1,000 mL (0 mLs Intravenous Stopped 03/03/23 1927)  fentaNYL (SUBLIMAZE) injection 50 mcg (50 mcg Intravenous Given 03/03/23 1958)    ED Course/ Medical Decision Making/ A&P                                 Medical Decision Making Patient is a 57 year old male, here for metastatic colon cancer, here for multiple complaints including shortness of breath, worse on exertion, nausea, vomiting, abdominal fullness and distention.  Has been going on for last 2 weeks and gotten worse.  Is tachycardic, but overall well-appearing on exam, but with some abdominal tenderness.  We obtain a CT abdomen pelvis, as well as a CTA PE, given patient's immuno compromised status, and known metastatic cancer.  We also obtain a urine, chest x-ray for further eval.  Will start IV antibiotics, no sepsis criteria as of now, other than tachycardia, thus we will hold on sepsis activation but start antibiotics and blood cultures. Will not bolus 63ml/kg d/t ascites on exam  Amount and/or Complexity of Data Reviewed Labs: ordered.    Details: Leukocytosis of 18K, lactic acid of 4.5, downgraded to 2.6 Radiology: ordered.    Details: No PE, intrathoracic metastatic disease with progression, enlargement of the thoracic lymph nodes and pulmonary metastasis, right pleural effusion, and trace pericardial effusion.  Worsening disease in the abdomen Discussion of management or test interpretation with external provider(s): Patient has elevated lactic acid, leukocytosis, has been feeling unwell, tachycardic, with temp of 100.1 F, concern for indolent bacteremia, will admit for this, for further evaluation.  I spoke with Dr. Birder Robson and he reviewed the patient's chart, and stated oncology wise not much else to do, patient is awaiting trial.  Admitted to Dr. Erenest Blank for further management, of tachycardia,  concern for bacteremia, and IV antibiotics.  Risk Prescription drug management. Decision regarding hospitalization.   Final Clinical Impression(s) / ED Diagnoses Final diagnoses:  SIRS (systemic inflammatory response syndrome) (HCC)  Malignant neoplasm of colon, unspecified part of colon (HCC)  Pericardial effusion  Pleural effusion  Metastatic malignant neoplasm, unspecified site [facility])    Rx / DC Orders ED Discharge Orders     None         Dolphus Jenny, Harley [place], PA 03/03/23 2045    Linwood Dibbles, MD 03/06/23 0921    Pete Pelt, PA 03/23/23 1640    Linwood Dibbles, MD 03/25/23 1505

## 2023-03-03 NOTE — Sepsis Progress Note (Signed)
Elink will follow per sepsis protocol  

## 2023-03-04 DIAGNOSIS — A419 Sepsis, unspecified organism: Secondary | ICD-10-CM | POA: Diagnosis not present

## 2023-03-04 DIAGNOSIS — R6510 Systemic inflammatory response syndrome (SIRS) of non-infectious origin without acute organ dysfunction: Secondary | ICD-10-CM | POA: Diagnosis not present

## 2023-03-04 DIAGNOSIS — C799 Secondary malignant neoplasm of unspecified site: Secondary | ICD-10-CM | POA: Diagnosis not present

## 2023-03-04 DIAGNOSIS — C189 Malignant neoplasm of colon, unspecified: Secondary | ICD-10-CM

## 2023-03-04 LAB — CBC
HCT: 27.5 % — ABNORMAL LOW (ref 39.0–52.0)
Hemoglobin: 8.2 g/dL — ABNORMAL LOW (ref 13.0–17.0)
MCH: 27.5 pg (ref 26.0–34.0)
MCHC: 29.8 g/dL — ABNORMAL LOW (ref 30.0–36.0)
MCV: 92.3 fL (ref 80.0–100.0)
Platelets: 170 10*3/uL (ref 150–400)
RBC: 2.98 MIL/uL — ABNORMAL LOW (ref 4.22–5.81)
RDW: 17.7 % — ABNORMAL HIGH (ref 11.5–15.5)
WBC: 16.1 10*3/uL — ABNORMAL HIGH (ref 4.0–10.5)
nRBC: 0 % (ref 0.0–0.2)

## 2023-03-04 LAB — URINALYSIS, W/ REFLEX TO CULTURE (INFECTION SUSPECTED)
Bilirubin Urine: NEGATIVE
Glucose, UA: NEGATIVE mg/dL
Hgb urine dipstick: NEGATIVE
Ketones, ur: NEGATIVE mg/dL
Nitrite: NEGATIVE
Protein, ur: NEGATIVE mg/dL
Specific Gravity, Urine: 1.026 (ref 1.005–1.030)
pH: 5 (ref 5.0–8.0)

## 2023-03-04 LAB — COMPREHENSIVE METABOLIC PANEL
ALT: 27 U/L (ref 0–44)
AST: 45 U/L — ABNORMAL HIGH (ref 15–41)
Albumin: 2 g/dL — ABNORMAL LOW (ref 3.5–5.0)
Alkaline Phosphatase: 140 U/L — ABNORMAL HIGH (ref 38–126)
Anion gap: 9 (ref 5–15)
BUN: 16 mg/dL (ref 6–20)
CO2: 20 mmol/L — ABNORMAL LOW (ref 22–32)
Calcium: 7.6 mg/dL — ABNORMAL LOW (ref 8.9–10.3)
Chloride: 105 mmol/L (ref 98–111)
Creatinine, Ser: 1.5 mg/dL — ABNORMAL HIGH (ref 0.61–1.24)
GFR, Estimated: 54 mL/min — ABNORMAL LOW (ref >60)
Glucose, Bld: 162 mg/dL — ABNORMAL HIGH (ref 70–99)
Potassium: 4.3 mmol/L (ref 3.5–5.1)
Sodium: 134 mmol/L — ABNORMAL LOW (ref 135–145)
Total Bilirubin: 0.8 mg/dL (ref 0.3–1.2)
Total Protein: 6.8 g/dL (ref 6.5–8.1)

## 2023-03-04 LAB — GLUCOSE, CAPILLARY
Glucose-Capillary: 113 mg/dL — ABNORMAL HIGH (ref 70–99)
Glucose-Capillary: 158 mg/dL — ABNORMAL HIGH (ref 70–99)
Glucose-Capillary: 81 mg/dL (ref 70–99)
Glucose-Capillary: 93 mg/dL (ref 70–99)
Glucose-Capillary: 116 mg/dL — ABNORMAL HIGH (ref 70–99)

## 2023-03-04 LAB — MRSA NEXT GEN BY PCR, NASAL: MRSA by PCR Next Gen: NOT DETECTED

## 2023-03-04 MED ORDER — SODIUM CHLORIDE 0.9% FLUSH: 10.0000 mL | INTRAVENOUS | Status: DC | PRN

## 2023-03-04 MED ORDER — LABETALOL HCL 5 MG/ML IV SOLN
5.0000 mg | INTRAVENOUS | Status: DC | PRN
  Administered 2023-03-07: 5 mg via INTRAVENOUS
  Filled 2023-03-04 (×2): qty 4

## 2023-03-04 MED ORDER — SODIUM CHLORIDE 0.9 % IV SOLN
2.0000 g | Freq: Three times a day (TID) | INTRAVENOUS | Status: DC
  Administered 2023-03-04 – 2023-03-08 (×12): 2 g via INTRAVENOUS
  Filled 2023-03-04 (×13): qty 12.5

## 2023-03-04 MED ORDER — SODIUM CHLORIDE 0.9% FLUSH
10.0000 mL | Freq: Two times a day (BID) | INTRAVENOUS | Status: DC
  Administered 2023-03-04 – 2023-03-08 (×3): 10 mL

## 2023-03-04 MED ORDER — VANCOMYCIN HCL 1750 MG/350ML IV SOLN
1750.0000 mg | Freq: Every day | INTRAVENOUS | Status: DC
  Administered 2023-03-04 – 2023-03-07 (×4): 1750 mg via INTRAVENOUS
  Filled 2023-03-04 (×5): qty 350

## 2023-03-04 MED ORDER — SODIUM CHLORIDE 0.9 % IV SOLN: INTRAVENOUS | Status: AC

## 2023-03-04 NOTE — Plan of Care (Signed)
  Problem: Education: Goal: Knowledge of disease or condition will improve Outcome: Progressing Goal: Knowledge of secondary prevention will improve (MUST DOCUMENT ALL) Outcome: Progressing   Problem: Coping: Goal: Will verbalize positive feelings about self Outcome: Progressing Goal: Will identify appropriate support needs Outcome: Progressing

## 2023-03-04 NOTE — Hospital Course (Addendum)
Jeff [name] is a 56 yo male with PMH HTN, hypothyroidism, depression, epilepsy, metastatic colon adenocarcinoma. He initially presented with nausea and fatigue.  There was concern for possible sepsis on admission and he was started on empiric antibiotics.

## 2023-03-04 NOTE — Progress Notes (Signed)
Pharmacy Antibiotic Note  THESEUS [name] is a 57 y.o. male with recent progression of metastatic colon cancer, admitted on 03/03/2023 with weakness, nausea, and anorexia.  Pharmacy has been consulted for vancomycin and cefepime dosing for sepsis. Noted to be in AKI upon admission (baseline SCr ~1.0)  03/04/2023 Tm 100.1, WBC 16.1, SCr 1.5, CTA chest/A/P all negative for infection  Plan: Change Vancomycin to 1750 mg IV q24 hr (est AUC 507 based on SCr 1.5; Vd 0.72) Increase Cefepime 2 g IV q 8 hr Flagyl per MD; dosing appropriate Measure vancomycin AUC at steady state as indicated F/u renal function, WBC, temp, culture data  Height: 5\' 10"  (177.8 cm) Weight: 93 kg (205 lb) IBW/kg (Calculated) : 73  Temp (24hrs), Avg:99.4 F (37.4 C), Min:98.4 F (36.9 C), Max:100.1 F (37.8 C)  Recent Labs  Lab 03/03/23 1444 03/03/23 1515 03/03/23 1730 03/04/23 0430  WBC 18.7*  --   --  16.1*  CREATININE 1.69*  --   --  1.50*  LATICACIDVEN  --  4.5* 2.6*  --     Estimated Creatinine Clearance: 63 mL/min (A) (by C-G formula based on SCr of 1.5 mg/dL (H)).    Allergies  Allergen Reactions   Hydrocodone Hives    Antimicrobials this admission: 10/18 vancomycin >>  10/18 cefepime >>  10/18 metronidazole >>  Dose adjustments this admission:  Microbiology results: 10/18 BCx: ngtd 10/18 MRSA -   Thank you for allowing pharmacy to be a part of this patient's care.  Herby Abraham, Pharm.D Use secure chat for questions 03/04/2023 10:57 AM

## 2023-03-04 NOTE — Progress Notes (Signed)
Progress Note   Patient: Jeff [name] [id number]:[id number] DOB: 1966-02-08 DOA: 03/03/2023     1 DOS: the patient was seen and examined on 03/04/2023   Brief hospital course: 57 y.o. male with medical history significant for hypertension, iatrogenic hypothyroidism, depression, epilepsy, metastatic colon adenocarcinoma being admitted to the hospital with severe sepsis.   Assessment and Plan: Severe sepsis from unclear source -meeting criteria with leukocytosis, tachycardia, AKI, initial lactate 4.5.  Source is unclear, however there is concern for acute bacterial infection given his immunocompromise status. -Follow blood cultures -UA clear -Continue empiric IV vancomycin, IV cefepime, IV Flagyl -Lactate improved -recheck cbc in AM   AKI-likely due to relative hypotension, and dehydration in the setting of sepsis -Cr improved with hydration -Recheck bmet in AM   Leukocytosis-may be reactive due to rapidly worsening malignancy, versus acute bacterial infection -cont abx per above -Recheck cbc in AM   Metastatic sigmoid adenocarcinoma -now unfortunately with rapid progression despite multiple chemotherapy cycles, most recently FOLFOX and is felt to have severely limited standard treatment options.  He has been referred to Dr. Maryruth Hancock to consider enrollment in clinical trial. -Continue with analgesia as needed   Lactic acidosis-potentially related to acute bacterial infection, versus rapid progression of his adenocarcinoma   Hyponatremia-likely due to relative dehydration, asymptomatic.  -Improved with hydration   Insulin-dependent type 2 diabetes -continue basal bolus insulin dosing, carb controlled diet   History of embolic CVA -continue home Eliquis   Hyperlipidemia -Cont Lipitor   Epilepsy -continue home Keppra 750 mg p.o. twice daily   Acquired hypothyroidism -continue Synthroid    Subjective: Feels better today. Denies sob or abd pain or diarrhea  Physical  Exam: Vitals:   03/04/23 0800 03/04/23 0830 03/04/23 1111 03/04/23 1202  BP: 98/79  105/71   Pulse:   (!) 103   Resp: 18     Temp:  98.4 F (36.9 C)  98.8 F (37.1 C)  TempSrc:  Oral  Oral  SpO2:      Weight:      Height:       General exam: Awake, laying in bed, in nad Respiratory system: Normal respiratory effort, no wheezing Cardiovascular system: regular rate, s1, s2 Gastrointestinal system: Soft, nondistended, positive BS Central nervous system: CN2-12 grossly intact, strength intact Extremities: Perfused, no clubbing Skin: Normal skin turgor, no notable skin lesions seen Psychiatry: Mood normal // no visual hallucinations   Data Reviewed:  Labs reviewed: Na 134, K 4.3, Cr 1.50, WBC 16.1, Hgb 8.2, Plts 170   Family Communication: Pt in room, family at bedside  Disposition: Status is: Inpatient Remains inpatient appropriate because: severity of illness  Planned Discharge Destination: Home   Author: Rickey Barbara, MD 03/04/2023 3:10 PM  For on call review [email or web address].

## 2023-03-05 DIAGNOSIS — R6510 Systemic inflammatory response syndrome (SIRS) of non-infectious origin without acute organ dysfunction: Secondary | ICD-10-CM | POA: Diagnosis not present

## 2023-03-05 DIAGNOSIS — A419 Sepsis, unspecified organism: Secondary | ICD-10-CM | POA: Diagnosis not present

## 2023-03-05 DIAGNOSIS — C799 Secondary malignant neoplasm of unspecified site: Secondary | ICD-10-CM | POA: Diagnosis not present

## 2023-03-05 DIAGNOSIS — C189 Malignant neoplasm of colon, unspecified: Secondary | ICD-10-CM | POA: Diagnosis not present

## 2023-03-05 LAB — GLUCOSE, CAPILLARY
Glucose-Capillary: 93 mg/dL (ref 70–99)
Glucose-Capillary: 149 mg/dL — ABNORMAL HIGH (ref 70–99)
Glucose-Capillary: 92 mg/dL (ref 70–99)
Glucose-Capillary: 135 mg/dL — ABNORMAL HIGH (ref 70–99)

## 2023-03-05 LAB — COMPREHENSIVE METABOLIC PANEL
ALT: 23 U/L (ref 0–44)
AST: 44 U/L — ABNORMAL HIGH (ref 15–41)
Albumin: 1.9 g/dL — ABNORMAL LOW (ref 3.5–5.0)
Alkaline Phosphatase: 153 U/L — ABNORMAL HIGH (ref 38–126)
Anion gap: 8 (ref 5–15)
BUN: 14 mg/dL (ref 6–20)
CO2: 19 mmol/L — ABNORMAL LOW (ref 22–32)
Calcium: 7.2 mg/dL — ABNORMAL LOW (ref 8.9–10.3)
Chloride: 106 mmol/L (ref 98–111)
Creatinine, Ser: 1.4 mg/dL — ABNORMAL HIGH (ref 0.61–1.24)
GFR, Estimated: 59 mL/min — ABNORMAL LOW (ref >60)
Glucose, Bld: 119 mg/dL — ABNORMAL HIGH (ref 70–99)
Potassium: 4.1 mmol/L (ref 3.5–5.1)
Sodium: 133 mmol/L — ABNORMAL LOW (ref 135–145)
Total Bilirubin: 0.5 mg/dL (ref 0.3–1.2)
Total Protein: 6.1 g/dL — ABNORMAL LOW (ref 6.5–8.1)

## 2023-03-05 LAB — CBC
HCT: 26.4 % — ABNORMAL LOW (ref 39.0–52.0)
Hemoglobin: 7.7 g/dL — ABNORMAL LOW (ref 13.0–17.0)
MCH: 27.3 pg (ref 26.0–34.0)
MCHC: 29.2 g/dL — ABNORMAL LOW (ref 30.0–36.0)
MCV: 93.6 fL (ref 80.0–100.0)
Platelets: 163 10*3/uL (ref 150–400)
RBC: 2.82 MIL/uL — ABNORMAL LOW (ref 4.22–5.81)
RDW: 18.1 % — ABNORMAL HIGH (ref 11.5–15.5)
WBC: 15 10*3/uL — ABNORMAL HIGH (ref 4.0–10.5)
nRBC: 0 % (ref 0.0–0.2)

## 2023-03-05 MED ORDER — SENNOSIDES-DOCUSATE SODIUM 8.6-50 MG PO TABS
1.0000 | ORAL_TABLET | Freq: Every day | ORAL | Status: DC
  Administered 2023-03-05 – 2023-03-08 (×4): 1 via ORAL
  Filled 2023-03-05 (×4): qty 1

## 2023-03-05 MED ORDER — POLYETHYLENE GLYCOL 3350 17 G PO PACK
17.0000 g | PACK | Freq: Every day | ORAL | Status: DC | PRN
  Administered 2023-03-06: 17 g via ORAL
  Filled 2023-03-05: qty 1

## 2023-03-05 MED ORDER — ALBUMIN HUMAN 25 % IV SOLN
25.0000 g | Freq: Once | INTRAVENOUS | Status: AC
  Administered 2023-03-05: 25 g via INTRAVENOUS
  Filled 2023-03-05: qty 100

## 2023-03-05 NOTE — Progress Notes (Signed)
Progress Note   Patient: Jeff [name] [id number]:[id number] DOB: 03-19-1966 DOA: 03/03/2023     2 DOS: the patient was seen and examined on 03/05/2023   Brief hospital course: 57 y.o. male with medical history significant for hypertension, iatrogenic hypothyroidism, depression, epilepsy, metastatic colon adenocarcinoma being admitted to the hospital with severe sepsis.   Assessment and Plan: Severe sepsis from unclear source -meeting criteria with leukocytosis, tachycardia, AKI, initial lactate 4.5.  Source is unclear, however there is concern for acute bacterial infection given his immunocompromise status. -Follow blood cultures, neg thus far -UA clear -Continue empiric IV vancomycin, IV cefepime, IV Flagyl -Lactate improved -If cultures return neg, consider focus on treating pulm source   AKI-likely due to relative hypotension, and dehydration in the setting of sepsis -Cr improved with hydration -Recheck bmet in AM   Leukocytosis-may be reactive due to rapidly worsening malignancy, versus acute bacterial infection -cont abx per above -Recheck cbc in AM   Metastatic sigmoid adenocarcinoma -now unfortunately with rapid progression despite multiple chemotherapy cycles, most recently FOLFOX and is felt to have severely limited standard treatment options.  He has been referred to Dr. Maryruth Hancock to consider enrollment in clinical trial. -Continue with analgesia as needed   Lactic acidosis-potentially related to acute bacterial infection, versus rapid progression of his adenocarcinoma   Hyponatremia-likely due to relative dehydration, asymptomatic.  -Improved with hydration   Insulin-dependent type 2 diabetes -continue basal bolus insulin dosing, carb controlled diet   History of embolic CVA -continue home Eliquis   Hyperlipidemia -Cont Lipitor   Epilepsy -continue home Keppra 750 mg p.o. twice daily   Acquired hypothyroidism -continue Synthroid    Subjective: Without  complaints today  Physical Exam: Vitals:   03/05/23 0725 03/05/23 0842 03/05/23 1132 03/05/23 1358  BP: 97/69 130/80 95/66 112/68  Pulse: 99  92 88  Resp: 18  18 18   Temp: 98 F (36.7 C)  98.9 F (37.2 C) 98.5 F (36.9 C)  TempSrc: Oral  Oral Oral  SpO2: 100%  100% 100%  Weight:      Height:       General exam: Conversant, in no acute distress Respiratory system: normal chest rise, clear, no audible wheezing Cardiovascular system: regular rhythm, s1-s2 Gastrointestinal system: Nondistended, nontender, pos BS Central nervous system: No seizures, no tremors Extremities: No cyanosis, no joint deformities Skin: No rashes, no pallor Psychiatry: Affect normal // no auditory hallucinations   Data Reviewed:  Labs reviewed: Na 133, K 4.1, Cr 1.40, WBC 15.0, Hgb 7.7, Plts 163   Family Communication: Pt in room, family over phone  Disposition: Status is: Inpatient Remains inpatient appropriate because: severity of illness  Planned Discharge Destination: Home   Author: Rickey Barbara, MD 03/05/2023 4:56 PM  For on call review [email or web address].

## 2023-03-06 ENCOUNTER — Inpatient Hospital Stay (HOSPITAL_COMMUNITY): Payer: Medicaid Other

## 2023-03-06 ENCOUNTER — Encounter: Payer: Self-pay | Admitting: *Deleted

## 2023-03-06 DIAGNOSIS — C189 Malignant neoplasm of colon, unspecified: Secondary | ICD-10-CM | POA: Diagnosis not present

## 2023-03-06 DIAGNOSIS — A419 Sepsis, unspecified organism: Secondary | ICD-10-CM | POA: Diagnosis not present

## 2023-03-06 DIAGNOSIS — R6510 Systemic inflammatory response syndrome (SIRS) of non-infectious origin without acute organ dysfunction: Secondary | ICD-10-CM

## 2023-03-06 DIAGNOSIS — R6520 Severe sepsis without septic shock: Secondary | ICD-10-CM | POA: Diagnosis not present

## 2023-03-06 DIAGNOSIS — C799 Secondary malignant neoplasm of unspecified site: Secondary | ICD-10-CM | POA: Diagnosis not present

## 2023-03-06 LAB — COMPREHENSIVE METABOLIC PANEL
ALT: 22 U/L (ref 0–44)
AST: 50 U/L — ABNORMAL HIGH (ref 15–41)
Albumin: 2.1 g/dL — ABNORMAL LOW (ref 3.5–5.0)
Alkaline Phosphatase: 155 U/L — ABNORMAL HIGH (ref 38–126)
Anion gap: 8 (ref 5–15)
BUN: 13 mg/dL (ref 6–20)
CO2: 17 mmol/L — ABNORMAL LOW (ref 22–32)
Calcium: 7.4 mg/dL — ABNORMAL LOW (ref 8.9–10.3)
Chloride: 109 mmol/L (ref 98–111)
Creatinine, Ser: 1.27 mg/dL — ABNORMAL HIGH (ref 0.61–1.24)
GFR, Estimated: >60 mL/min (ref >60)
Glucose, Bld: 137 mg/dL — ABNORMAL HIGH (ref 70–99)
Potassium: 4.3 mmol/L (ref 3.5–5.1)
Sodium: 134 mmol/L — ABNORMAL LOW (ref 135–145)
Total Bilirubin: 0.8 mg/dL (ref 0.3–1.2)
Total Protein: 6.2 g/dL — ABNORMAL LOW (ref 6.5–8.1)

## 2023-03-06 LAB — CBC
HCT: 27.7 % — ABNORMAL LOW (ref 39.0–52.0)
Hemoglobin: 8.1 g/dL — ABNORMAL LOW (ref 13.0–17.0)
MCH: 26.9 pg (ref 26.0–34.0)
MCHC: 29.2 g/dL — ABNORMAL LOW (ref 30.0–36.0)
MCV: 92 fL (ref 80.0–100.0)
Platelets: 167 K/uL (ref 150–400)
RBC: 3.01 MIL/uL — ABNORMAL LOW (ref 4.22–5.81)
RDW: 18.3 % — ABNORMAL HIGH (ref 11.5–15.5)
WBC: 16 K/uL — ABNORMAL HIGH (ref 4.0–10.5)
nRBC: 0 % (ref 0.0–0.2)

## 2023-03-06 LAB — GLUCOSE, CAPILLARY
Glucose-Capillary: 149 mg/dL — ABNORMAL HIGH (ref 70–99)
Glucose-Capillary: 161 mg/dL — ABNORMAL HIGH (ref 70–99)
Glucose-Capillary: 117 mg/dL — ABNORMAL HIGH (ref 70–99)
Glucose-Capillary: 139 mg/dL — ABNORMAL HIGH (ref 70–99)

## 2023-03-06 MED ORDER — POLYETHYLENE GLYCOL 3350 17 G PO PACK: 17.0000 g | PACK | Freq: Two times a day (BID) | ORAL | 0 refills | Status: DC

## 2023-03-06 MED ORDER — LORAZEPAM 0.5 MG PO TABS
0.5000 mg | ORAL_TABLET | Freq: Three times a day (TID) | ORAL | Status: DC | PRN
  Administered 2023-03-06 – 2023-03-09 (×7): 0.5 mg via ORAL
  Filled 2023-03-06 (×7): qty 1

## 2023-03-06 MED ORDER — HEPARIN SOD (PORK) LOCK FLUSH 100 UNIT/ML IV SOLN
500.0000 [IU] | INTRAVENOUS | Status: DC | PRN
Start: 2023-03-06 — End: 2023-03-09

## 2023-03-06 MED ORDER — AMOXICILLIN-POT CLAVULANATE 875-125 MG PO TABS: 1.0000 | ORAL_TABLET | Freq: Two times a day (BID) | ORAL | 0 refills | Status: DC

## 2023-03-06 MED ORDER — POLYETHYLENE GLYCOL 3350 17 G PO PACK
17.0000 g | PACK | Freq: Two times a day (BID) | ORAL | Status: DC
  Administered 2023-03-06 – 2023-03-09 (×4): 17 g via ORAL
  Filled 2023-03-06 (×6): qty 1

## 2023-03-06 MED ORDER — SENNOSIDES-DOCUSATE SODIUM 8.6-50 MG PO TABS: 1.0000 | ORAL_TABLET | Freq: Every day | ORAL | 0 refills | Status: DC

## 2023-03-06 MED ORDER — CARBAMIDE PEROXIDE 6.5 % OT SOLN
5.0000 [drp] | Freq: Two times a day (BID) | OTIC | Status: DC
  Administered 2023-03-06 – 2023-03-09 (×4): 5 [drp] via OTIC
  Filled 2023-03-06 (×2): qty 15

## 2023-03-06 NOTE — Progress Notes (Signed)
Progress Note   Patient: Jeff [name] [id number]:[id number] DOB: 1965-11-02 DOA: 03/03/2023     3 DOS: the patient was seen and examined on 03/06/2023   Brief hospital course: 57 y.o. male with medical history significant for hypertension, iatrogenic hypothyroidism, depression, epilepsy, metastatic colon adenocarcinoma being admitted to the hospital with severe sepsis.   Assessment and Plan: Severe sepsis from unclear source -meeting criteria with leukocytosis, tachycardia, AKI, initial lactate 4.5.  Source is unclear, however there is concern for acute bacterial infection given his immunocompromise status. -Follow blood cultures, neg thus far -UA clear -Continued on empiric IV vancomycin, IV cefepime, IV Flagyl -Lactate impoved -Periods of tachycardia noted, initially thought to be related to anxiety and needing ativan. Later in the day, pt noted to became tachycardic and desaturate to the 70's. CXR ordered. Pending result   AKI-likely due to relative hypotension, and dehydration in the setting of sepsis -Cr improved with hydration -Recheck bmet in AM   Leukocytosis-may be reactive due to rapidly worsening malignancy, versus acute bacterial infection -cont abx per above -Recheck cbc in AM   Metastatic sigmoid adenocarcinoma -now unfortunately with rapid progression despite multiple chemotherapy cycles, most recently FOLFOX and is felt to have severely limited standard treatment options.  He has been referred to Dr. Maryruth Hancock to consider enrollment in clinical trial. -Continue with analgesia as needed   Lactic acidosis-potentially related to acute bacterial infection, versus rapid progression of his adenocarcinoma   Hyponatremia-likely due to relative dehydration, asymptomatic.  -Improved with hydration   Insulin-dependent type 2 diabetes -continue basal bolus insulin dosing, carb controlled diet   History of embolic CVA -continue home Eliquis   Hyperlipidemia -Cont Lipitor    Epilepsy -continue home Keppra 750 mg p.o. twice daily   Acquired hypothyroidism -continue Synthroid    Subjective: Seen initially wihtout complaints today. Later in the day, noted to become tachycardic with hypoxia to the 70's  Physical Exam: Vitals:   03/06/23 0924 03/06/23 1049 03/06/23 1325 03/06/23 1647  BP: (!) 129/102 102/80 101/74 (!) 102/55  Pulse: 90 (!) 135 97 (!) 139  Resp: 18 20 18 20   Temp: 98.5 F (36.9 C)  98.6 F (37 C)   TempSrc: Oral  Oral   SpO2: 100% 95% 100% 100%  Weight:      Height:       General exam: Awake, laying in bed, in nad Respiratory system: Normal respiratory effort, no wheezing Cardiovascular system: regular rate, s1, s2 Gastrointestinal system: Soft, nondistended, positive BS Central nervous system: CN2-12 grossly intact, strength intact Extremities: Perfused, no clubbing Skin: Normal skin turgor, no notable skin lesions seen Psychiatry: Mood normal // no visual hallucinations   Data Reviewed:  Labs reviewed: Na 134, K 4.3, Cr 1.27, WBC 16, Hgb 8.1   Family Communication: Pt in room, family over phone  Disposition: Status is: Inpatient Remains inpatient appropriate because: severity of illness  Planned Discharge Destination: Home   Author: Rickey Barbara, MD 03/06/2023 7:03 PM  For on call review [email or web address].

## 2023-03-06 NOTE — Progress Notes (Signed)
Oncology Discharge Planning Note  [facility] Address: [address], [place], [place] [address] Hours of Operation:  Lewayne Bunting, Monday - Friday  Clinic Contact Information:  [phone]  Oncology Care Team: Medical Oncologist:  Truett Perna  Patient Details: Name:  Jeff [name], Jeff [name] MRN:   [id number] DOB:   [birth year] Reason for Current Admission: @PPROB @  Discharge Planning Narrative: Notification of admission received by Inpatient team for [facility].  Discharge follow-up appointments for oncology are current and available on the AVS and MyChart.   Upon discharge from the hospital, hematology/oncology's post discharge plan of care for the outpatient setting is:   November  8,2024 at [facility]  [facility] [address] [place], [place] [address]  [phone]  Ezrael Leischner will be called within two business days after discharge to review hematology/oncology's plan of care for full understanding.    Outpatient Oncology Specific Care Only: Oncology appointment transportation needs addressed?:  no Oncology medication management for symptom management addressed?:  no Chemo Alert Card reviewed?:  yes Immunotherapy Alert Card reviewed?:  no

## 2023-03-06 NOTE — Progress Notes (Signed)
   03/06/23 1048  TOC Brief Assessment  Insurance and Status Reviewed  Patient has primary care physician Yes  Home environment has been reviewed home with spouse  Prior level of function: independent  Prior/Current Home Services No current home services  Social Determinants of Health Reivew SDOH reviewed no interventions necessary  Readmission risk has been reviewed Yes  Transition of care needs no transition of care needs at this time

## 2023-03-06 NOTE — Discharge Summary (Incomplete)
Physician Discharge Summary   Patient: Jeff [name] MRN: [id number] DOB: Oct 06, 1965  Admit date:     03/03/2023  Discharge date: 03/06/23  Discharge Physician: Jeff [name]   PCP: Jeff [name]   Recommendations at discharge:  {Tip this will not be part of the note when signed- Example include specific recommendations for outpatient follow-up, pending tests to follow-up on. (Optional):26781}  Follow up with PCP in 1-2 weeks Follow up with Oncology as scheduled  Discharge Diagnoses: Principal Problem:   Severe sepsis (HCC)  Resolved Problems:   * No resolved hospital problems. *  Hospital Course: 57 y.o. male with medical history significant for hypertension, iatrogenic hypothyroidism, depression, epilepsy, metastatic colon adenocarcinoma being admitted to the hospital with severe sepsis.   Assessment and Plan: Severe sepsis from unclear source -meeting criteria with leukocytosis, tachycardia, AKI, initial lactate 4.5.  Source is unclear, however there is concern for acute bacterial infection given his immunocompromise status. -Follow blood cultures, neg thus far -UA clear -Continue empiric IV vancomycin, IV cefepime, IV Flagyl -Lactate improved -If cultures return neg, consider focus on treating pulm source   AKI-likely due to relative hypotension, and dehydration in the setting of sepsis -Cr improved with hydration -Recheck bmet in AM   Leukocytosis-may be reactive due to rapidly worsening malignancy, versus acute bacterial infection -cont abx per above -Recheck cbc in AM   Metastatic sigmoid adenocarcinoma -now unfortunately with rapid progression despite multiple chemotherapy cycles, most recently FOLFOX and is felt to have severely limited standard treatment options.  He has been referred to Jeff [name] to consider enrollment in clinical trial. -Continue with analgesia as needed   Lactic acidosis-potentially related to acute bacterial infection, versus rapid  progression of his adenocarcinoma   Hyponatremia-likely due to relative dehydration, asymptomatic.  -Improved with hydration   Insulin-dependent type 2 diabetes -continue basal bolus insulin dosing, carb controlled diet   History of embolic CVA -continue home Eliquis   Hyperlipidemia -Cont Lipitor   Epilepsy -continue home Keppra 750 mg p.o. twice daily   Acquired hypothyroidism -continue Synthroid   {(NOTE) Pain control PDMP Statment (Optional):26782} Consultants: Oncology Procedures performed:   Disposition: Home Diet recommendation:  Carb modified diet DISCHARGE MEDICATION: Allergies as of 03/06/2023       Reactions   Hydrocodone Hives        Medication List     STOP taking these medications    amLODipine 5 MG tablet Commonly known as: NORVASC       TAKE these medications    Accu-Chek Guide test strip Generic drug: glucose blood CHECK BLOOD SUGAR THREE TIMES DAILY AS DIRECTED   Accu-Chek Softclix Lancets lancets USE  TID TO CHECK BLOOD SUGAR LEVELS   amoxicillin-clavulanate 875-125 MG tablet Commonly known as: AUGMENTIN Take 1 tablet by mouth 2 (two) times daily for 3 days.   apixaban 5 MG Tabs tablet Commonly known as: ELIQUIS Take 1 tablet (5 mg total) by mouth 2 (two) times daily.   atorvastatin 80 MG tablet Commonly known as: LIPITOR Take 1 tablet (80 mg total) by mouth daily. TAKE 1 TABLET(80 MG) BY MOUTH AT BEDTIME What changed:  when to take this additional instructions   benzonatate 100 MG capsule Commonly known as: TESSALON TAKE 1 CAPSULE BY MOUTH 2 TIMES DAILY AS NEEDED FOR COUGH. What changed: See the new instructions.   Claritin-D 24 Hour 10-240 MG 24 hr tablet Generic drug: loratadine-pseudoephedrine Take 1 tablet by mouth daily.   dapagliflozin propanediol 10 MG Tabs tablet Commonly  known as: FARXIGA Take 10 mg by mouth daily.   diphenoxylate-atropine 2.5-0.025 MG tablet Commonly known as: LOMOTIL Take 1-2 tablets  by mouth 4 (four) times daily as needed for diarrhea or loose stools.   docusate sodium 100 MG capsule Commonly known as: COLACE Take 100 mg by mouth 2 (two) times daily.   Global Ease Inject Pen Needles 31G X 5 MM Misc Generic drug: Insulin Pen Needle Inject 1 Syringe into the skin 4 (four) times daily.   levETIRAcetam 750 MG tablet Commonly known as: KEPPRA Take 1 tablet twice a day What changed:  how much to take how to take this when to take this additional instructions   levothyroxine 137 MCG tablet Commonly known as: SYNTHROID Take 137 mcg by mouth daily before breakfast.   lidocaine-prilocaine cream Commonly known as: EMLA Apply 1 Application topically as needed. Apply to portacath site 1 hour prior to use What changed:  reasons to take this additional instructions   LORazepam 0.5 MG tablet Commonly known as: ATIVAN Take 1 tablet (0.5 mg total) by mouth every 8 (eight) hours as needed for anxiety.   meclizine 25 MG tablet Commonly known as: ANTIVERT Take 25 mg by mouth 3 (three) times daily as needed for dizziness.   metFORMIN 500 MG 24 hr tablet Commonly known as: GLUCOPHAGE-XR Take 1,000 mg by mouth in the morning and at bedtime.   metoprolol tartrate 100 MG tablet Commonly known as: LOPRESSOR Take 100 mg by mouth 2 (two) times daily.   mirtazapine 15 MG tablet Commonly known as: REMERON Take 1 tablet (15 mg total) by mouth at bedtime.   nitroGLYCERIN 0.4 MG SL tablet Commonly known as: NITROSTAT DISSOLVE 1 TABLET UNDER TONGUE EVERY 5 MINUTES AS NEEDED FOR CHEST PAIN. IF NO RELIEF AFTER 3 DOSES CALL 911. What changed:  how much to take how to take this when to take this reasons to take this additional instructions   NovoLOG FlexPen 100 UNIT/ML FlexPen Generic drug: insulin aspart Inject 18 Units into the skin 3 (three) times daily with meals as needed for high blood sugar.   ondansetron 8 MG tablet Commonly known as: ZOFRAN Take 1 tablet (8  mg total) by mouth every 8 (eight) hours as needed for nausea or vomiting.   oxyCODONE-acetaminophen 10-325 MG tablet Commonly known as: PERCOCET Take 1 tablet by mouth every 6 (six) hours as needed for pain.   pantoprazole 40 MG tablet Commonly known as: PROTONIX Take 40 mg by mouth 2 (two) times daily.   polyethylene glycol 17 g packet Commonly known as: MIRALAX / GLYCOLAX Take 17 g by mouth 2 (two) times daily.   potassium chloride SA 20 MEQ tablet Commonly known as: KLOR-CON M TAKE 1 TABLET BY MOUTH DAILY.   promethazine 12.5 MG tablet Commonly known as: PHENERGAN Take 1 tablet (12.5 mg total) by mouth every 6 (six) hours as needed. What changed: reasons to take this   senna-docusate 8.6-50 MG tablet Commonly known as: Senokot-S Take 1 tablet by mouth at bedtime.   tamsulosin 0.4 MG Caps capsule Commonly known as: FLOMAX Take 0.4 mg by mouth 2 (two) times daily.   tiZANidine 4 MG tablet Commonly known as: ZANAFLEX Take 4 mg by mouth every 8 (eight) hours as needed for muscle spasms.   Evaristo Bury FlexTouch 200 UNIT/ML FlexTouch Pen Generic drug: insulin degludec Inject 80 Units into the skin at bedtime.   venlafaxine XR 75 MG 24 hr capsule Commonly known as: EFFEXOR-XR Take 225 mg by  mouth daily.   Xtampza ER 13.5 MG C12a Generic drug: oxyCODONE ER Take 13.5 mg by mouth every 12 (twelve) hours.        Follow-up Information     Jeff [name] Follow up in 2 week(s).   Specialty: Internal Medicine Why: Hospital follow up Contact information: [address] [place] [address] [phone]         Jeff [name] as scheduled Follow up.   Why: Hospital follow up               Discharge Exam: Filed Weights   03/03/23 1408  Weight: 93 kg   General exam: Awake, laying in bed, in nad Respiratory system: Normal respiratory effort, no wheezing Cardiovascular system: regular rate, s1, s2 Gastrointestinal system: Soft, nondistended,  positive BS Central nervous system: CN2-12 grossly intact, strength intact Extremities: Perfused, no clubbing Skin: Normal skin turgor, no notable skin lesions seen Psychiatry: Mood normal // no visual hallucinations   Condition at discharge: fair  The results of significant diagnostics from this hospitalization (including imaging, microbiology, ancillary and laboratory) are listed below for reference.   Imaging Studies: CT ABDOMEN PELVIS W CONTRAST  Result Date: 03/03/2023 CLINICAL DATA:  Bowel obstruction suspected Patient with known history of metastatic colon cancer. EXAM: CT ABDOMEN AND PELVIS WITH CONTRAST TECHNIQUE: Multidetector CT imaging of the abdomen and pelvis was performed using the standard protocol following bolus administration of intravenous contrast. RADIATION DOSE REDUCTION: This exam was performed according to the departmental dose-optimization program which includes automated exposure control, adjustment of the mA and/or kV according to patient size and/or use of iterative reconstruction technique. CONTRAST:  75mL OMNIPAQUE IOHEXOL 350 MG/ML SOLN COMPARISON:  CT 02/14/2023 FINDINGS: Lower chest: Assessed on concurrent chest CT, reported separately. Hepatobiliary: Again seen known hepatic metastatic disease. Masses in the left lobe have coalesced from prior exam with slight increase in size, exact measurements difficult due to the infiltrative nature. Representative right lobe lesion measuring 2.9 x 2.1 cm series 4, image 22, previously measured 2.5 x 2.1 cm. There is no obvious portal vein invasion or thrombus. Small gallstones within partially distended gallbladder. No biliary dilatation. Small amount of perihepatic ascites is new. Pancreas: No ductal dilatation or inflammation. Spleen: Normal in size without focal abnormality. Small amount of perisplenic ascites, new. Adrenals/Urinary Tract: Exophytic 12 mm right adrenal nodule versus adjacent lymph node is grossly stable.  Normal left adrenal gland. Punctate right and 5 mm nonobstructing left renal calculi. No hydronephrosis. There is no perinephric inflammation. Symmetric excretion on delayed phase imaging. Unremarkable urinary bladder. Stomach/Bowel: Lack of enteric contrast limits detailed bowel assessment. Nondistended stomach. No small bowel dilatation or evidence of obstruction. Enteric sutures in the sigmoid. Small to moderate volume of stool in colon. There is no evidence of colonic obstruction or inflammation. Vascular/Lymphatic: Aortic atherosclerosis. No aneurysm. Again seen multifocal adenopathy, progressed. Representative portal caval node measures 4.5 x 2.6 cm, series 4, image 35, previously 4.2 x 2.2 retrocaval nodal conglomerate measures 4.2 x 2.2 cm series 4, image 41, previously 3.5 x 1.8 cm. Cm. Reproductive: Prominent prostate again seen. Other: Significant progression in peritoneal nodularity an wispy soft tissue density in the anterior omentum. There previous 10 mm soft tissue nodule is now obscured by diffuse omental soft tissue density in thickening. Small to moderate volume ascites in the abdomen and pelvis is new. Musculoskeletal: Posterior lumbar fusion hardware. No evidence of focal bone lesion or acute osseous findings. IMPRESSION: 1. No bowel obstruction. 2.  Known metastatic disease with progression. This includes development of diffuse omental caking and new small to moderate abdominopelvic ascites over the last 17 days. Progression in known hepatic and nodal metastatic disease. 3. Gallstones and nonobstructive renal calculi. Aortic Atherosclerosis (ICD10-I70.0). Electronically Signed   By: Narda Rutherford M.D.   On: 03/03/2023 19:15   CT Angio Chest PE W and/or Wo Contrast  Result Date: 03/03/2023 CLINICAL DATA:  Pulmonary embolism (PE) suspected, high prob Shortness of breath. Review of radiologic records indicates history of metastatic colon cancer. EXAM: CT ANGIOGRAPHY CHEST WITH CONTRAST  TECHNIQUE: Multidetector CT imaging of the chest was performed using the standard protocol during bolus administration of intravenous contrast. Multiplanar CT image reconstructions and MIPs were obtained to evaluate the vascular anatomy. RADIATION DOSE REDUCTION: This exam was performed according to the departmental dose-optimization program which includes automated exposure control, adjustment of the mA and/or kV according to patient size and/or use of iterative reconstruction technique. CONTRAST:  75mL OMNIPAQUE IOHEXOL 350 MG/ML SOLN COMPARISON:  Radiograph earlier today.  Chest CT 02/14/2023 FINDINGS: Cardiovascular: There are no filling defects within the pulmonary arteries to suggest pulmonary embolus. Mediastinal adenopathy causes mass effect on the right middle lobar pulmonary artery but no filling defects. Borderline cardiomegaly. Small pericardial effusion. No acute aortic findings. Right chest port in place. Mediastinum/Nodes: Multifocal mediastinal and hilar adenopathy, with progression from prior exam. For example, subcarinal node measures 2.5 cm short axis series 2, image 62, previously 2.2 cm. Right hilar node measures 1.8 cm short axis series 2, image 59, previously 1.6 cm. Right anterior pericardial node measures 4.1 x 2.5 cm, series 2, image 93, previously 4 x 2.5 cm. There is enlargement of multiple additional mediastinal and hilar nodes. No esophageal wall thickening. Lungs/Pleura: Pulmonary metastatic disease again seen, with progression from prior. Many of the previous nodules and masses have now coalesced making exact measurement difficult. This includes a right middle lobe masslike conglomerate measuring 4.1 x 5.8 cm, series 10, image 66, previously 4 x 5.1 cm on my remeasurement. Left lower lobe mass measures 3.4 x 2.9 cm, series 10, image 64, previously 3.4 x 2 6 cm. Many of the smaller nodules have also increased in size. There is a trace right pleural effusion, new. Upper Abdomen:  Assessed on concurrent abdominopelvic CT, reported separately. Musculoskeletal: No acute findings or evidence of osseous metastatic disease. Review of the MIP images confirms the above findings. IMPRESSION: 1. No pulmonary embolus. 2. Known intrathoracic metastatic disease with progression over the last 17 days. Enlargement of thoracic lymph nodes and pulmonary metastasis. 3. Trace right pleural effusion, new. Small pericardial effusion. Electronically Signed   By: Narda Rutherford M.D.   On: 03/03/2023 19:07   DG Chest 2 View  Result Date: 03/03/2023 CLINICAL DATA:  Shortness of breath. EXAM: CHEST - 2 VIEW COMPARISON:  06/08/2022. FINDINGS: There are at least 3 lung masses with largest in the middle lobe measuring up to 2.8 x 4.8 cm, compatible with metastasis. There is interval increase in the size when compared to the prior radiograph from 06/08/2022. Bilateral lung fields are otherwise clear. No acute consolidation or lung collapse. Bilateral costophrenic angles are clear. Stable cardio-mediastinal silhouette. No acute osseous abnormalities. The soft tissues are within normal limits. Redemonstration of right-sided CT Port-A-Cath with its tip overlying the lower portion of superior vena cava, unchanged. IMPRESSION: 1. Interval increase in size of pulmonary metastasis. 2. No acute cardiopulmonary abnormality. Electronically Signed   By: Jules Schick M.D.   On:  03/03/2023 16:31   CT CHEST ABDOMEN PELVIS W CONTRAST  Result Date: 02/21/2023 CLINICAL DATA:  Restaging metastatic colon cancer. * Tracking Code: BO * EXAM: CT CHEST, ABDOMEN, AND PELVIS WITH CONTRAST TECHNIQUE: Multidetector CT imaging of the chest, abdomen and pelvis was performed following the standard protocol during bolus administration of intravenous contrast. RADIATION DOSE REDUCTION: This exam was performed according to the departmental dose-optimization program which includes automated exposure control, adjustment of the mA and/or kV  according to patient size and/or use of iterative reconstruction technique. CONTRAST:  OMNIPAQUE IOHEXOL 300 MG/ML  SOLN COMPARISON:  10/22/2022 FINDINGS: CT CHEST FINDINGS Cardiovascular: Normal heart size. No pericardial effusion. Aortic atherosclerosis. Mediastinum/Nodes: Thyroid gland, trachea and esophagus are unremarkable. -Subcarinal lymph node measures 2.2 cm, image 30/2. Previously 1.2 cm. -Right hilar measures 1.6 cm, image 30/2.  Previously 1.3 cm. -Right CP angle lymph node measures 2.5 x 4.0 cm, image 46/2. Formally 2.2 x 3.5 cm. -New lymph node within the left CP angle measures 1.9 x 1.1 cm, image 51/2. Lungs/Pleura: No pleural effusion. Multifocal bilateral pulmonary metastases are again identified. -Within the left midlung there is a perihilar mass which measures 5.1 x 3.2 cm, image 82/4. Formally 4.4 by 3.3 cm. -Left lower lobe lung lesion measures 3.4 x 2.6 cm, image 80/4. Formally 3.1 x 2.3 cm. -Right middle lobe lung mass measures 4.0 x 3.1 cm, image 84/4. Previously this measured 3.6 x 2.8 cm. Musculoskeletal: No suspicious bone lesions. CT ABDOMEN PELVIS FINDINGS Hepatobiliary: Multifocal liver metastases are again identified. -Confluent mass within the lateral segment of left lobe measures 7.8 x 6.6 cm, image 60/2. Formally 6.6 x 6.1 cm. -Lesion within the periphery of the right lobe measures 2.5 x 2.1 cm, image 53/2. Previously 2.0 x 1.9 cm. -Index lesion within the inferior right lobe measures 2.4 x 1.6 cm, image 68/2. Previously 2.4 by 1.5 cm. Small stones identified within the partially decompressed bladder. No bile duct dilatation Pancreas: Unremarkable. No pancreatic ductal dilatation or surrounding inflammatory changes. Spleen: Normal in size without focal abnormality. Adrenals/Urinary Tract: Normal adrenal glands. Nonobstructing stone within the left kidney measures 5 mm. No hydronephrosis or suspicious mass. Bladder is unremarkable. Stomach/Bowel: Stomach appears nondistended.  The appendix is visualized and is within normal limits. No pathologic dilatation of the large or small bowel loops. Postoperative changes from sigmoid resection. Vascular/Lymphatic: Aortic atherosclerosis without aneurysm. -Portacaval lymph node measures 4.2 x 2.2 cm, image 65/2. Previously 3.1 x 1.8 cm. -New retrocaval nodal conglomeration measures 3.5 x 1.8 cm, image 68/2. -Left periaortic lymph node measures 1.2 cm short axis, image 82/2. Formally 0.6 cm. Reproductive: Prostate gland enlargement. Other: New peritoneal soft tissue nodule within the ventral abdomen measures 1 cm, image 76/2. Also new is a small peritoneal nodule in the upper abdomen measuring 0.8 cm, image 71/2. No significant ascites or focal fluid collections. Musculoskeletal: No suspicious bone lesions. Postoperative change within the lumbar spine. IMPRESSION: 1. Interval progression of disease with increased size of bilateral pulmonary metastases, mediastinal and hilar adenopathy, and liver metastases. 2. Increased size of portacaval and retrocaval adenopathy. 3. New peritoneal soft tissue nodules within the ventral abdomen and upper abdomen suggests of early peritoneal carcinomatosis. 4. Nonobstructing left renal calculus. 5. Prostate gland enlargement. 6.  Aortic Atherosclerosis (ICD10-I70.0). Electronically Signed   By: Signa Kell M.D.   On: 02/21/2023 07:58    Microbiology: Results for orders placed or performed during the hospital encounter of 03/03/23  MRSA Next Gen by PCR, Nasal  Status: None   Collection Time: 03/03/23 12:24 AM   Specimen: Nasal Mucosa; Nasal Swab  Result Value Ref Range Status   MRSA by PCR Next Gen NOT DETECTED NOT DETECTED Final    Comment: (NOTE) The GeneXpert MRSA Assay (FDA approved for NASAL specimens only), is one component of a comprehensive MRSA colonization surveillance program. It is not intended to diagnose MRSA infection nor to guide or monitor treatment for MRSA infections. Test  performance is not FDA approved in patients less than 52 years old. Performed at [facility], [address], [place], [place] [address]   Resp panel by RT-PCR (RSV, Flu A&B, Covid) Anterior Nasal Swab     Status: None   Collection Time: 03/03/23  2:50 PM   Specimen: Anterior Nasal Swab  Result Value Ref Range Status   SARS Coronavirus 2 by RT PCR NEGATIVE NEGATIVE Final    Comment: (NOTE) SARS-CoV-2 target nucleic acids are NOT DETECTED.  The SARS-CoV-2 RNA is generally detectable in upper respiratory specimens during the acute phase of infection. The lowest concentration of SARS-CoV-2 viral copies this assay can detect is 138 copies/mL. A negative result does not preclude SARS-Cov-2 infection and should not be used as the sole basis for treatment or other patient management decisions. A negative result may occur with  improper specimen collection/handling, submission of specimen other than nasopharyngeal swab, presence of viral mutation(s) within the areas targeted by this assay, and inadequate number of viral copies(<138 copies/mL). A negative result must be combined with clinical observations, patient history, and epidemiological information. The expected result is Negative.  Fact Sheet for Patients:  BloggerCourse.com  Fact Sheet for Healthcare Providers:  SeriousBroker.it  This test is no t yet approved or cleared by the [place] FDA and  has been authorized for detection and/or diagnosis of SARS-CoV-2 by FDA under an Emergency Use Authorization (EUA). This EUA will remain  in effect (meaning this test can be used) for the duration of the COVID-19 declaration under Section 564(b)(1) of the Act, 21 U.S.C.section 360bbb-3(b)(1), unless the authorization is terminated  or revoked sooner.       Influenza A by PCR NEGATIVE NEGATIVE Final   Influenza B by PCR NEGATIVE NEGATIVE Final    Comment:  (NOTE) The Xpert Xpress SARS-CoV-2/FLU/RSV plus assay is intended as an aid in the diagnosis of influenza from Nasopharyngeal swab specimens and should not be used as a sole basis for treatment. Nasal washings and aspirates are unacceptable for Xpert Xpress SARS-CoV-2/FLU/RSV testing.  Fact Sheet for Patients: BloggerCourse.com  Fact Sheet for Healthcare Providers: SeriousBroker.it  This test is not yet approved or cleared by the [place] FDA and has been authorized for detection and/or diagnosis of SARS-CoV-2 by FDA under an Emergency Use Authorization (EUA). This EUA will remain in effect (meaning this test can be used) for the duration of the COVID-19 declaration under Section 564(b)(1) of the Act, 21 U.S.C. section 360bbb-3(b)(1), unless the authorization is terminated or revoked.     Resp Syncytial Virus by PCR NEGATIVE NEGATIVE Final    Comment: (NOTE) Fact Sheet for Patients: BloggerCourse.com  Fact Sheet for Healthcare Providers: SeriousBroker.it  This test is not yet approved or cleared by the [place] FDA and has been authorized for detection and/or diagnosis of SARS-CoV-2 by FDA under an Emergency Use Authorization (EUA). This EUA will remain in effect (meaning this test can be used) for the duration of the COVID-19 declaration under Section 564(b)(1) of the Act, 21 U.S.C.  section 360bbb-3(b)(1), unless the authorization is terminated or revoked.  Performed at [facility], [address] [place], [place] [address]   Blood culture (routine x 2)     Status: None (Preliminary result)   Collection Time: 03/03/23  3:15 PM   Specimen: BLOOD  Result Value Ref Range Status   Specimen Description   Final    BLOOD LEFT ANTECUBITAL Performed at [facility], [address] [place], [place] [address]    Special Requests    Final    BOTTLES DRAWN AEROBIC AND ANAEROBIC Blood Culture adequate volume Performed at [facility], [address] [place], [place] [address]    Culture   Final    NO GROWTH 3 DAYS Performed at [facility], [address], [place], [place] [address]    Report Status PENDING  Incomplete  Blood culture (routine x 2)     Status: None (Preliminary result)   Collection Time: 03/03/23  3:17 PM   Specimen: BLOOD  Result Value Ref Range Status   Specimen Description   Final    BLOOD RIGHT ANTECUBITAL Performed at [facility], [address], [place], [place] [address]    Special Requests   Final    BOTTLES DRAWN AEROBIC AND ANAEROBIC Blood Culture adequate volume Performed at [facility], [address], [place], [place] [address]    Culture   Final    NO GROWTH 3 DAYS Performed at [facility], [address], [place], [place] [address]    Report Status PENDING  Incomplete   *Note: Due to a large number of results and/or encounters for the requested time period, some results have not been displayed. A complete set of results can be found in Results Review.    Labs: CBC: Recent Labs  Lab 03/03/23 1444 03/04/23 0430 03/05/23 0239 03/06/23 0300  WBC 18.7* 16.1* 15.0* 16.0*  NEUTROABS 13.4*  --   --   --   HGB 9.8* 8.2* 7.7* 8.1*  HCT 31.6* 27.5* 26.4* 27.7*  MCV 88.0 92.3 93.6 92.0  PLT 238 170 163 167   Basic Metabolic Panel: Recent Labs  Lab 03/03/23 1444 03/04/23 0430 03/05/23 0239 03/06/23 0300  NA 131* 134* 133* 134*  K 4.5 4.3 4.1 4.3  CL 97* 105 106 109  CO2 18* 20* 19* 17*  GLUCOSE 178* 162* 119* 137*  BUN 16 16 14 13   CREATININE 1.69* 1.50* 1.40* 1.27*  CALCIUM 8.3* 7.6* 7.2* 7.4*   Liver Function Tests: Recent Labs  Lab 03/03/23 1444 03/04/23 0430 03/05/23 0239 03/06/23 0300  AST 63* 45* 44* 50*  ALT 34 27 23 22   ALKPHOS 164* 140* 153* 155*  BILITOT 0.9 0.8 0.5 0.8  PROT  8.0 6.8 6.1* 6.2*  ALBUMIN 2.5* 2.0* 1.9* 2.1*   CBG: Recent Labs  Lab 03/05/23 1130 03/05/23 1622 03/05/23 2120 03/06/23 0741 03/06/23 1138  GLUCAP 149* 92 135* 149* 161*    Discharge time spent: less than 30 minutes.  Signed: Rickey Barbara, [name] 03/06/2023

## 2023-03-06 NOTE — Progress Notes (Addendum)
   Patient Saturations on Room Air at Rest = 90%  Patient Saturations on Room Air while Ambulating = 86 % on 2 lt  Patient Saturations on 2 Liters of oxygen while Ambulating = 96%  Please briefly explain why patient needs home oxygen: Prior to ambulation saturation was 90% on room air. The patient began to walk and made it approximately 15 feet and reports worsening SOB, sat decreased 86% on room air. RR 20-24. Oxygen at 2 lt [place] applied. Saturation increased 96%.

## 2023-03-06 NOTE — Progress Notes (Addendum)
IP PROGRESS NOTE  Subjective:   [name] is well-known to with a history of metastatic colon cancer.  He presented to the emergency room with nausea and fatigue on 03/03/2023.  He was noted to have a low-grade fever and tachycardia.  He was admitted for evaluation of possible sepsis.  White count was elevated. Jeff [name] reports feeling better.  No pain at present.  He has been constipated for several weeks.  Objective: Vital signs in last 24 hours: Blood pressure 102/80, pulse (!) 135, temperature 98.5 F (36.9 C), temperature source Oral, resp. rate 20, height 5\' 10"  (1.778 m), weight 205 lb (93 kg), SpO2 95%.  Intake/Output from previous day: 10/20 0701 - 10/21 0700 In: 1657.1 [P.O.:840; IV Piggyback:817.1] Out: 200 [Urine:200]  Physical Exam:  HEENT: No thrush Lungs: Clear bilaterally Cardiac: Regular rate and rhythm Abdomen: Mildly distended, no hepatosplenomegaly, nontender, no mass Extremities: No leg edema   Portacath/PICC-without erythema  Lab Results: Recent Labs    03/05/23 0239 03/06/23 0300  WBC 15.0* 16.0*  HGB 7.7* 8.1*  HCT 26.4* 27.7*  PLT 163 167    BMET Recent Labs    03/05/23 0239 03/06/23 0300  NA 133* 134*  K 4.1 4.3  CL 106 109  CO2 19* 17*  GLUCOSE 119* 137*  BUN 14 13  CREATININE 1.40* 1.27*  CALCIUM 7.2* 7.4*    Lab Results  Component Value Date   CEA1 3.19 09/10/2020   CEA 86.38 (H) 02/21/2023    Medications: I have reviewed the patient's current medications.  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, biopsy confirmed adenocarcinoma         CTs chest, abdomen, and pelvis on 12/11/2015-indeterminate tiny pulmonary nodules, multiple liver metastases, small nodes in the sigmoid mesocolon Laparoscopic sigmoid colectomy, liver biopsy, Port-A-Cath placement 01/14/2016 Pathology sigmoid colon resection- colonic adenocarcinoma, 5.3 cm extending into pericolonic  connective tissue, positive lymph-vascular invasion, positive perineural invasion, negative margins, metastatic carcinoma in 9 of 28 lymph nodes Pathology liver biopsy-metastatic colorectal adenocarcinoma MSI stable; mismatch repair protein normal APC alteration, K RAS wild-type, no BRAF mutation Cycle 1 FOLFOX 02/02/2016 Cycle 2 FOLFOX 02/15/2016 Cycle 3 FOLFOX 02/29/2016 Cycle 4 FOLFOX 03/14/2016 Cycle 5 FOLFOX 03/28/2016 Cycle 6 FOLFOX 04/11/2016 (oxaliplatin held secondary to thrombocytopenia) 04/21/2016 restaging CTs-difficulty evaluating liver lesions due to hepatic steatosis. Stable right adrenal nodule. No adenopathy or local recurrence near the rectosigmoid anastomotic site. Cycle 7 FOLFOX 04/25/2016 MRI liver 05/02/2016-partial improvement in hepatic metastases Cycle 8 FOLFOX 05/10/2016 Cycle 9 FOLFOX 05/23/2016 (oxaliplatin held due to thrombocytopenia) Cycle 10 FOLFOX 06/06/2016 (oxaliplatin dose reduced due to thrombocytopenia) Cycle 11 FOLFOX 06/27/2016 (oxaliplatin held due to neuropathy) Cycle 12 FOLFOX 07/11/2016 (oxaliplatin held) Initiation of maintenance Xeloda 7 days on/7 days off 07/27/2016 MRI liver 11/18/2016-decrease in hepatic metastatic disease. No new or progressive disease identified within the abdomen. Continuation of Xeloda 7 days on/7 days off MRI liver 04/27/2017-previous liver lesions not identified, no new lesions, no lymphadenopathy Xeloda continued 7 days on/7 days off MRI liver 12/04/2017 - no evidence of metastatic disease, hepatic steatosis Xeloda continued 7 days on/7 days off MRI liver 07/15/2018- no evidence of metastatic disease.  Stable severe hepatic steatosis. Xeloda continued 7 days on/7 days off MRI liver 03/16/2019-hepatic steatosis, no liver mass, focal area of intrahepatic biliary dilatation in segments 2 and 3 of the left lobe-increased Xeloda continued 7 days on/7 days off MRI abdomen 08/19/2019-no findings to suggest liver metastases.   Bilateral lung  nodules measuring up to 2.8 cm, progressive and more conspicuous than on previous exam CT chest 08/29/2019-multiple pulmonary metastases, new from 04/21/2016 Cycle 1 FOLFIRI/bevacizumab 09/09/2019 Cycle 2 FOLFIRI/bevacizumab 09/26/2019  Cycle 3 FOLFIRI/bevacizumab 10/10/2019 Cycle 4 FOLFIRI/bevacizumab 10/24/2019 Cycle 5 FOLFIRI/bevacizumab 11/07/2019 CT chest 11/14/2019-decreased size of lung nodules, no new lesions, hepatic steatosis Cycle 6 FOLFIRI/bevacizumab 11/21/2019 Cycle 7 FOLFIRI/bevacizumab 12/05/2019 Cycle 8 FOLFIRI/bevacizumab 12/19/2019 Cycle 9 FOLFIRI/bevacizumab 01/02/2020 Cycle 10 FOLFIRI/bevacizumab 01/16/2020 CT chest 01/29/2020-stable bilateral pulmonary metastases.  No new or progressive metastatic disease in the chest. Cycle 11 FOLFIRI/bevacizumab 01/30/2020 Cycle 12 FOLFIRI/bevacizumab 02/19/2020 Cycle 13 FOLFIRI/bevacizumab 03/12/2020 Cycle 14 FOLFIRI/bevacizumab 04/02/2020 Cycle 15 FOLFIRI/bevacizumab 04/23/2020 Cycle 16 FOLFIRI/bevacizumab 05/21/2020 CT chest 06/09/2020-mild progression pulmonary metastasis.  Some lesions have increased in size while others are similar. Cycle 1 irinotecan/Panitumumab 06/18/2020 Cycle 2 irinotecan/Panitumumab 07/02/2020 Cycle 3 irinotecan/panitumumab 07/16/2020 Cycle 4 irinotecan/Panitumumab 07/30/2020 Cycle 5 irinotecan/Panitumumab 08/13/2020, Emend added Cycle 6 irinotecan/Panitumumab 08/27/2020 CT chest 09/08/2020-decreased size of pulmonary nodules, no progressive disease Cycle 7 irinotecan/panitumumab 09/02/2020 Cycle 8 irinotecan/panitumumab 09/24/2020 Cycle 9 irinotecan/Panitumumab 10/08/2020 10/22/2020 treatment held due to left foot fracture, need for surgery Cycle 10 irinotecan/panitumumab 11/24/2020 Cycle 11 irinotecan/Panitumumab 12/08/2020 Cycle 12 irinotecan/panitumumab 12/22/2020 Cycle 13 irinotecan/panitumumab 01/05/2021 01/20/2021 CT chest-mixed response with minimal increase in size of some lesions and minimal decrease in the size of  other lesions.  Overall number of lesions is unchanged. Cycle 14 irinotecan/Panitumumab 02/05/2021 Cycle 15 irinotecan/Panitumumab 02/23/2021 Cycle 16 irinotecan/panitumumab 03/16/2021 Cycle 17 irinotecan/Panitumumab 04/13/2021 05/03/2021-CT chest-enlargement of pulmonary metastases, no new lesions 06/21/2021-cycle 1 Lonsurf/bevacizumab 07/19/2021-cycle 2 Lonsurf/bevacizumab 08/16/2021-cycle 3 Lonsurf/bevacizumab 09/09/2021 CT chest-bilateral lung nodules and masses with mixed response, overall stable to very minimally increased 09/13/2021 cycle 4 Lonsurf/bevacizumab 10/12/2021 cycle 5 Lonsurf/bevacizumab 10/21/2021 chest CT-bilateral pulmonary metastases without significant change.  No new or progressive disease within the chest. 11/08/2021 cycle 6 Lonsurf/bevacizumab 12/06/2021 cycle 7 Lonsurf/bevacizumab 01/03/2022 cycle 8 Lonsurf/bevacizumab CTs 01/25/2022-index lung lesion stable to mildly increased in size.  Mild retroperitoneal adenopathy, mildly increased in size compared to 12/16/2020. 02/01/2022 cycle 9 Lonsurf/bevacizumab 02/28/2022 cycle 10 Lonsurf/bevacizumab 03/28/2022 cycle 11 Lonsurf/bevacizumab CTs 04/21/2022-no change in bilateral pulmonary metastases, 2 new hypoattenuating liver lesions, new enlarged portacaval node Cycle 1 fruquintinib 05/24/2022 Cycle 2 fruquintinib 06/21/2022 Cycle 3 fruquintinib 07/19/2022 CTs 08/12/2022-stable lung nodules, hilar and retroperitoneal nodes, slight increase in size of liver metastases, stable right adrenal nodule Cycle 4 fruquintinib 08/16/2022 Cycle 5 fruquintinib 09/13/2022 Cycle 6 fruquintinib 10/11/2022 CTs 10/22/2022-progressive mediastinal, pericardial, and periportal adenopathy, enlargement of liver lesions, stable pulmonary nodules Cycle 1 FOLFOX 11/14/2022 Cycle 2 FOLFOX 11/29/2022, oxaliplatin dose reduced due to thrombocytopenia Cycle 3 FOLFOX 12/27/2022 Cycle 4 FOLFOX 01/10/2023 Cycle 5 FOLFOX 01/24/2023 Cycle 6 FOLFOX 02/07/2023 CTs 02/14/2023-progressive  metastases in the lungs, liver, and chest/retroperitoneal lymph nodes and new peritoneal soft tissue nodules CT chest 03/03/2023-negative for pulmonary embolism, progression of metastatic lymphadenopathy and pleural masses, new trace right pleural effusion, small pericardial effusion CT abdomen/pelvis 03/03/2023-progressive metastatic disease in the liver and abdominal lymph nodes, development of omental caking and new small to moderate volume ascites   2.   Rectal bleeding and constipation secondary to #1   3.   History of peripheral vascular disease, status post left lower extremity vascular bypass surgery in April 2017   4.   History of nephrolithiasis   5.   History of Graves' disease treated with radioactive iodine   6.   Anxiety/depression   7.   Hypertension   8.   Hospitalization 01/19/2016 with wound dehiscence status post secondary suture  closure of abdominal wall   9.   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 Hyperglycemia 06/20/2016-diagnosed with diabetes, maintained on insulin   11.  Positive COVID test 12/13/2018; positive COVID test 01/25/2021   12.  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  Admission 03/03/2023 with nausea, fever, and tachycardia   [name] has metastatic colon cancer.  There has been recent clinical and radiologic evidence of disease progression.  He was admitted 03/03/2023 with nausea and tachycardia.  He feels better, but continues to have intermittent tachycardia.  No source for infection has been identified.  The Port-A-Cath does not appear infected.  Cultures are negative to date.  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  I reviewed the CT chest and abdomen images.  He has a significant tumor burden.  This could be responsible for the leukocytosis and his symptoms.  He has  anemia secondary to chronic disease, chemotherapy, renal insufficiency, and potentially bleeding.  The exertional dyspnea may be in part related to anemia.  Jeff [name] was present by telephone when I saw him this morning.  Recommendations: Follow-up cultures, complete course of antibiotics Bowel regimen Transfuse packed red blood cells for symptomatic anemia Follow-up with Dr. Maryruth Hancock as scheduled in 2 weeks. Outpatient follow-up will be scheduled [facility], to follow the appointment with Dr. Maryruth Hancock Please call oncology as needed   LOS: 3 days   Thornton Papas, MD   03/06/2023, 12:48 PM

## 2023-03-07 ENCOUNTER — Inpatient Hospital Stay: Payer: Medicaid Other

## 2023-03-07 ENCOUNTER — Other Ambulatory Visit: Payer: Self-pay | Admitting: Cardiovascular Disease

## 2023-03-07 ENCOUNTER — Inpatient Hospital Stay: Payer: Medicaid Other | Admitting: Oncology

## 2023-03-07 ENCOUNTER — Inpatient Hospital Stay (HOSPITAL_COMMUNITY): Payer: Medicaid Other

## 2023-03-07 ENCOUNTER — Encounter: Payer: Self-pay | Admitting: Oncology

## 2023-03-07 DIAGNOSIS — C189 Malignant neoplasm of colon, unspecified: Secondary | ICD-10-CM | POA: Diagnosis not present

## 2023-03-07 DIAGNOSIS — R6510 Systemic inflammatory response syndrome (SIRS) of non-infectious origin without acute organ dysfunction: Secondary | ICD-10-CM | POA: Diagnosis not present

## 2023-03-07 DIAGNOSIS — N179 Acute kidney failure, unspecified: Secondary | ICD-10-CM

## 2023-03-07 DIAGNOSIS — C187 Malignant neoplasm of sigmoid colon: Secondary | ICD-10-CM | POA: Diagnosis not present

## 2023-03-07 DIAGNOSIS — A419 Sepsis, unspecified organism: Secondary | ICD-10-CM | POA: Diagnosis not present

## 2023-03-07 DIAGNOSIS — R6520 Severe sepsis without septic shock: Secondary | ICD-10-CM | POA: Diagnosis not present

## 2023-03-07 DIAGNOSIS — C799 Secondary malignant neoplasm of unspecified site: Secondary | ICD-10-CM | POA: Diagnosis not present

## 2023-03-07 LAB — COMPREHENSIVE METABOLIC PANEL
ALT: 19 U/L (ref 0–44)
AST: 48 U/L — ABNORMAL HIGH (ref 15–41)
Albumin: 1.9 g/dL — ABNORMAL LOW (ref 3.5–5.0)
Alkaline Phosphatase: 155 U/L — ABNORMAL HIGH (ref 38–126)
Anion gap: 8 (ref 5–15)
BUN: 20 mg/dL (ref 6–20)
CO2: 15 mmol/L — ABNORMAL LOW (ref 22–32)
Calcium: 7.4 mg/dL — ABNORMAL LOW (ref 8.9–10.3)
Chloride: 110 mmol/L (ref 98–111)
Creatinine, Ser: 1.76 mg/dL — ABNORMAL HIGH (ref 0.61–1.24)
GFR, Estimated: 45 mL/min — ABNORMAL LOW (ref >60)
Glucose, Bld: 163 mg/dL — ABNORMAL HIGH (ref 70–99)
Potassium: 4.6 mmol/L (ref 3.5–5.1)
Sodium: 133 mmol/L — ABNORMAL LOW (ref 135–145)
Total Bilirubin: 0.6 mg/dL (ref 0.3–1.2)
Total Protein: 6.2 g/dL — ABNORMAL LOW (ref 6.5–8.1)

## 2023-03-07 LAB — CBC
HCT: 25.5 % — ABNORMAL LOW (ref 39.0–52.0)
Hemoglobin: 7.5 g/dL — ABNORMAL LOW (ref 13.0–17.0)
MCH: 27.1 pg (ref 26.0–34.0)
MCHC: 29.4 g/dL — ABNORMAL LOW (ref 30.0–36.0)
MCV: 92.1 fL (ref 80.0–100.0)
Platelets: 145 10*3/uL — ABNORMAL LOW (ref 150–400)
RBC: 2.77 MIL/uL — ABNORMAL LOW (ref 4.22–5.81)
RDW: 18.4 % — ABNORMAL HIGH (ref 11.5–15.5)
WBC: 16.8 10*3/uL — ABNORMAL HIGH (ref 4.0–10.5)
nRBC: 0 % (ref 0.0–0.2)

## 2023-03-07 LAB — BODY FLUID CELL COUNT WITH DIFFERENTIAL
Lymphs, Fluid: 12 %
Monocyte-Macrophage-Serous Fluid: 51 % (ref 50–90)
Neutrophil Count, Fluid: 37 % — ABNORMAL HIGH (ref 0–25)
Total Nucleated Cell Count, Fluid: 1854 uL — ABNORMAL HIGH (ref 0–1000)

## 2023-03-07 LAB — PREPARE RBC (CROSSMATCH)

## 2023-03-07 LAB — GLUCOSE, CAPILLARY
Glucose-Capillary: 128 mg/dL — ABNORMAL HIGH (ref 70–99)
Glucose-Capillary: 136 mg/dL — ABNORMAL HIGH (ref 70–99)
Glucose-Capillary: 122 mg/dL — ABNORMAL HIGH (ref 70–99)

## 2023-03-07 LAB — MAGNESIUM: Magnesium: 1.7 mg/dL (ref 1.7–2.4)

## 2023-03-07 MED ORDER — LIDOCAINE HCL 1 % IJ SOLN
INTRAMUSCULAR | Status: AC
  Filled 2023-03-07: qty 20

## 2023-03-07 MED ORDER — SODIUM CHLORIDE 0.9% IV SOLUTION: Freq: Once | INTRAVENOUS | Status: AC

## 2023-03-07 NOTE — Telephone Encounter (Signed)
Refill request

## 2023-03-07 NOTE — Plan of Care (Signed)
  Problem: Education: Goal: Knowledge of disease or condition will improve Outcome: Progressing   Problem: Coping: Goal: Will verbalize positive feelings about self Outcome: Progressing   Problem: Health Behavior/Discharge Planning: Goal: Ability to manage health-related needs will improve Outcome: Progressing Goal: Goals will be collaboratively established with patient/family Outcome: Progressing   Problem: Self-Care: Goal: Verbalization of feelings and concerns over difficulty with self-care will improve Outcome: Progressing

## 2023-03-07 NOTE — Progress Notes (Signed)
IP PROGRESS NOTE  Subjective:   Mr. Zapanta developed dyspnea/hypoxia" dizziness "when ambulating yesterday.  He reports feeling better today.  No bowel movement.  Objective: Vital signs in last 24 hours: Blood pressure 105/75, pulse (!) 110, temperature 98.3 F (36.8 C), temperature source Oral, resp. rate 14, height 5\' 10"  (1.778 m), weight 205 lb (93 kg), SpO2 100%.  Intake/Output from previous day: 10/21 0701 - 10/22 0700 In: 1750 [P.O.:1020; IV Piggyback:730] Out: 0   Physical Exam:  HEENT: No thrush Lungs: Decreased breath sounds with coarse rhonchi at the right posterior base, no respiratory distress Cardiac: Regular rate and rhythm Abdomen: distended, no hepatosplenomegaly, nontender, no mass Extremities: No leg edema   Portacath/PICC-without erythema  Lab Results: Recent Labs    03/05/23 0239 03/06/23 0300  WBC 15.0* 16.0*  HGB 7.7* 8.1*  HCT 26.4* 27.7*  PLT 163 167    BMET Recent Labs    03/05/23 0239 03/06/23 0300  NA 133* 134*  K 4.1 4.3  CL 106 109  CO2 19* 17*  GLUCOSE 119* 137*  BUN 14 13  CREATININE 1.40* 1.27*  CALCIUM 7.2* 7.4*    Lab Results  Component Value Date   CEA1 3.19 09/10/2020   CEA 86.38 (H) 02/21/2023    Medications: I have reviewed the patient's current medications.  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, biopsy confirmed adenocarcinoma         CTs chest, abdomen, and pelvis on 12/11/2015-indeterminate tiny pulmonary nodules, multiple liver metastases, small nodes in the sigmoid mesocolon Laparoscopic sigmoid colectomy, liver biopsy, Port-A-Cath placement 01/14/2016 Pathology sigmoid colon resection- colonic adenocarcinoma, 5.3 cm extending into pericolonic connective tissue, positive lymph-vascular invasion, positive perineural invasion, negative margins, metastatic carcinoma in 9 of 28 lymph nodes Pathology liver biopsy-metastatic colorectal  adenocarcinoma MSI stable; mismatch repair protein normal APC alteration, K RAS wild-type, no BRAF mutation Cycle 1 FOLFOX 02/02/2016 Cycle 2 FOLFOX 02/15/2016 Cycle 3 FOLFOX 02/29/2016 Cycle 4 FOLFOX 03/14/2016 Cycle 5 FOLFOX 03/28/2016 Cycle 6 FOLFOX 04/11/2016 (oxaliplatin held secondary to thrombocytopenia) 04/21/2016 restaging CTs-difficulty evaluating liver lesions due to hepatic steatosis. Stable right adrenal nodule. No adenopathy or local recurrence near the rectosigmoid anastomotic site. Cycle 7 FOLFOX 04/25/2016 MRI liver 05/02/2016-partial improvement in hepatic metastases Cycle 8 FOLFOX 05/10/2016 Cycle 9 FOLFOX 05/23/2016 (oxaliplatin held due to thrombocytopenia) Cycle 10 FOLFOX 06/06/2016 (oxaliplatin dose reduced due to thrombocytopenia) Cycle 11 FOLFOX 06/27/2016 (oxaliplatin held due to neuropathy) Cycle 12 FOLFOX 07/11/2016 (oxaliplatin held) Initiation of maintenance Xeloda 7 days on/7 days off 07/27/2016 MRI liver 11/18/2016-decrease in hepatic metastatic disease. No new or progressive disease identified within the abdomen. Continuation of Xeloda 7 days on/7 days off MRI liver 04/27/2017-previous liver lesions not identified, no new lesions, no lymphadenopathy Xeloda continued 7 days on/7 days off MRI liver 12/04/2017 - no evidence of metastatic disease, hepatic steatosis Xeloda continued 7 days on/7 days off MRI liver 07/15/2018- no evidence of metastatic disease.  Stable severe hepatic steatosis. Xeloda continued 7 days on/7 days off MRI liver 03/16/2019-hepatic steatosis, no liver mass, focal area of intrahepatic biliary dilatation in segments 2 and 3 of the left lobe-increased Xeloda continued 7 days on/7 days off MRI abdomen 08/19/2019-no findings to suggest liver metastases.  Bilateral lung nodules measuring up to 2.8 cm, progressive and more conspicuous than on previous exam CT chest 08/29/2019-multiple pulmonary metastases, new from 04/21/2016 Cycle 1  FOLFIRI/bevacizumab 09/09/2019 Cycle 2 FOLFIRI/bevacizumab 09/26/2019  Cycle 3 FOLFIRI/bevacizumab 10/10/2019 Cycle 4 FOLFIRI/bevacizumab 10/24/2019  Cycle 5 FOLFIRI/bevacizumab 11/07/2019 CT chest 11/14/2019-decreased size of lung nodules, no new lesions, hepatic steatosis Cycle 6 FOLFIRI/bevacizumab 11/21/2019 Cycle 7 FOLFIRI/bevacizumab 12/05/2019 Cycle 8 FOLFIRI/bevacizumab 12/19/2019 Cycle 9 FOLFIRI/bevacizumab 01/02/2020 Cycle 10 FOLFIRI/bevacizumab 01/16/2020 CT chest 01/29/2020-stable bilateral pulmonary metastases.  No new or progressive metastatic disease in the chest. Cycle 11 FOLFIRI/bevacizumab 01/30/2020 Cycle 12 FOLFIRI/bevacizumab 02/19/2020 Cycle 13 FOLFIRI/bevacizumab 03/12/2020 Cycle 14 FOLFIRI/bevacizumab 04/02/2020 Cycle 15 FOLFIRI/bevacizumab 04/23/2020 Cycle 16 FOLFIRI/bevacizumab 05/21/2020 CT chest 06/09/2020-mild progression pulmonary metastasis.  Some lesions have increased in size while others are similar. Cycle 1 irinotecan/Panitumumab 06/18/2020 Cycle 2 irinotecan/Panitumumab 07/02/2020 Cycle 3 irinotecan/panitumumab 07/16/2020 Cycle 4 irinotecan/Panitumumab 07/30/2020 Cycle 5 irinotecan/Panitumumab 08/13/2020, Emend added Cycle 6 irinotecan/Panitumumab 08/27/2020 CT chest 09/08/2020-decreased size of pulmonary nodules, no progressive disease Cycle 7 irinotecan/panitumumab 09/02/2020 Cycle 8 irinotecan/panitumumab 09/24/2020 Cycle 9 irinotecan/Panitumumab 10/08/2020 10/22/2020 treatment held due to left foot fracture, need for surgery Cycle 10 irinotecan/panitumumab 11/24/2020 Cycle 11 irinotecan/Panitumumab 12/08/2020 Cycle 12 irinotecan/panitumumab 12/22/2020 Cycle 13 irinotecan/panitumumab 01/05/2021 01/20/2021 CT chest-mixed response with minimal increase in size of some lesions and minimal decrease in the size of other lesions.  Overall number of lesions is unchanged. Cycle 14 irinotecan/Panitumumab 02/05/2021 Cycle 15 irinotecan/Panitumumab 02/23/2021 Cycle 16 irinotecan/panitumumab  03/16/2021 Cycle 17 irinotecan/Panitumumab 04/13/2021 05/03/2021-CT chest-enlargement of pulmonary metastases, no new lesions 06/21/2021-cycle 1 Lonsurf/bevacizumab 07/19/2021-cycle 2 Lonsurf/bevacizumab 08/16/2021-cycle 3 Lonsurf/bevacizumab 09/09/2021 CT chest-bilateral lung nodules and masses with mixed response, overall stable to very minimally increased 09/13/2021 cycle 4 Lonsurf/bevacizumab 10/12/2021 cycle 5 Lonsurf/bevacizumab 10/21/2021 chest CT-bilateral pulmonary metastases without significant change.  No new or progressive disease within the chest. 11/08/2021 cycle 6 Lonsurf/bevacizumab 12/06/2021 cycle 7 Lonsurf/bevacizumab 01/03/2022 cycle 8 Lonsurf/bevacizumab CTs 01/25/2022-index lung lesion stable to mildly increased in size.  Mild retroperitoneal adenopathy, mildly increased in size compared to 12/16/2020. 02/01/2022 cycle 9 Lonsurf/bevacizumab 02/28/2022 cycle 10 Lonsurf/bevacizumab 03/28/2022 cycle 11 Lonsurf/bevacizumab CTs 04/21/2022-no change in bilateral pulmonary metastases, 2 new hypoattenuating liver lesions, new enlarged portacaval node Cycle 1 fruquintinib 05/24/2022 Cycle 2 fruquintinib 06/21/2022 Cycle 3 fruquintinib 07/19/2022 CTs 08/12/2022-stable lung nodules, hilar and retroperitoneal nodes, slight increase in size of liver metastases, stable right adrenal nodule Cycle 4 fruquintinib 08/16/2022 Cycle 5 fruquintinib 09/13/2022 Cycle 6 fruquintinib 10/11/2022 CTs 10/22/2022-progressive mediastinal, pericardial, and periportal adenopathy, enlargement of liver lesions, stable pulmonary nodules Cycle 1 FOLFOX 11/14/2022 Cycle 2 FOLFOX 11/29/2022, oxaliplatin dose reduced due to thrombocytopenia Cycle 3 FOLFOX 12/27/2022 Cycle 4 FOLFOX 01/10/2023 Cycle 5 FOLFOX 01/24/2023 Cycle 6 FOLFOX 02/07/2023 CTs 02/14/2023-progressive metastases in the lungs, liver, and chest/retroperitoneal lymph nodes and new peritoneal soft tissue nodules CT chest 03/03/2023-negative for pulmonary embolism, progression  of metastatic lymphadenopathy and pleural masses, new trace right pleural effusion, small pericardial effusion CT abdomen/pelvis 03/03/2023-progressive metastatic disease in the liver and abdominal lymph nodes, development of omental caking and new small to moderate volume ascites   2.   Rectal bleeding and constipation secondary to #1   3.   History of peripheral vascular disease, status post left lower extremity vascular bypass surgery in April 2017   4.   History of nephrolithiasis   5.   History of Graves' disease treated with radioactive iodine   6.   Anxiety/depression   7.   Hypertension   8.   Hospitalization 01/19/2016 with wound dehiscence status post secondary suture closure of abdominal wall   9.   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 Hyperglycemia 06/20/2016-diagnosed with diabetes, maintained on insulin   11.  Positive COVID test 12/13/2018;  positive COVID test 01/25/2021   12.  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  Admission 03/03/2023 with nausea, fever, and tachycardia   Mr. Paone has metastatic colon cancer.  There has been recent clinical and radiologic evidence of disease progression.  He was admitted 03/03/2023 with nausea and tachycardia.  He feels better, but continues to have intermittent tachycardia and dyspnea.  No source for infection has been identified.  The Port-A-Cath does not appear infected.  Cultures are negative to date.  It is possible his symptoms are all related to the systemic tumor burden chest and abdomen.   I reviewed the CT chest and abdomen images.  He has a significant tumor burden.  This could be responsible for the leukocytosis and his symptoms.  Carcinomatosis may account for the constipation.  He has anemia secondary to chronic disease, chemotherapy, renal insufficiency, and potentially bleeding.  The exertional dyspnea may be in part related to  anemia.  Mr. Wolfenbarger may benefit from a palliative therapeutic paracentesis.  This may help the dyspnea.  Recommendations: Follow-up cultures, complete course of antibiotics Bowel regimen Transfuse packed red blood cells for symptomatic anemia Follow-up with Dr. Maryruth Hancock as scheduled in 2 weeks. Therapeutic paracentesis Outpatient follow-up as scheduled at the Cancer center   LOS: 4 days   Thornton Papas, MD   03/07/2023, 1:23 PM

## 2023-03-07 NOTE — Procedures (Signed)
PROCEDURE SUMMARY:  Successful image-guided diagnostic and therapeutic paracentesis from the left lower quadrant abdomen.  Yielded 3.9 liters of bloody  fluid.  No immediate complications.  EBL: <2 cc's Patient tolerated well.   Specimen sent for labs.  Please see imaging section of Epic for full dictation.  Ardith Dark PA-C 03/07/2023 11:46 AM

## 2023-03-07 NOTE — Progress Notes (Signed)
Progress Note   Patient: Jeff [name] ZOX:[id number] DOB: 1966-02-28 DOA: 03/03/2023     4 DOS: the patient was seen and examined on 03/07/2023   Brief hospital course: 57 y.o. male with medical history significant for hypertension, iatrogenic hypothyroidism, depression, epilepsy, metastatic colon adenocarcinoma being admitted to the hospital with severe sepsis.   Assessment and Plan: Severe sepsis from unclear source -meeting criteria with leukocytosis, tachycardia, AKI, initial lactate 4.5.  Source is unclear, however there is concern for acute bacterial infection given his immunocompromise status. -Blood cx neg, UA clear, CXR without acute process -Continued on empiric IV vancomycin, IV cefepime, IV Flagyl -Lactate impoved -Pt is s/p diagnostic paracentesis, reviewed. Fluid with 1800 white cells with 37% neutrophils. Discussed with Oncology. Elevated WBC may be directly related to cancer, however recs to continue empiric IV abx for now pending fluid cultures.   AKI-likely due to relative hypotension, and dehydration in the setting of sepsis -Cr up to 1.76 today, transfusing 2 units RBC's per below -Recheck bmet in AM   Leukocytosis-may be reactive due to rapidly worsening malignancy, versus acute bacterial infection -cont abx per above -Recheck cbc in AM   Metastatic sigmoid adenocarcinoma -now unfortunately with rapid progression despite multiple chemotherapy cycles, most recently FOLFOX and is felt to have severely limited standard treatment options.  He has been referred to Dr. Maryruth Hancock to consider enrollment in clinical trial. -Continue with analgesia as needed   Lactic acidosis-potentially related to acute bacterial infection, versus rapid progression of his adenocarcinoma   Hyponatremia-likely due to relative dehydration, asymptomatic.  -Improved with hydration   Insulin-dependent type 2 diabetes -continue basal bolus insulin dosing, carb controlled diet   History  of embolic CVA -continue home Eliquis   Hyperlipidemia -Cont Lipitor   Epilepsy -continue home Keppra 750 mg p.o. twice daily   Acquired hypothyroidism -continue Synthroid  Anemia of malignancy -No evidence of acute blood loss -Discussed with Dr. Truett Perna. Plan to transfuse 2 units PRBC's today -Recheck CBC in AM    Subjective: Without complaints this AM. Denies chest pain, or sob. Has chronic back pains  Physical Exam: Vitals:   03/07/23 1138 03/07/23 1144 03/07/23 1150 03/07/23 1319  BP: 109/77 114/76 105/75 111/74  Pulse:    (!) 114  Resp:    18  Temp:    99.3 F (37.4 C)  TempSrc:    Oral  SpO2:    98%  Weight:      Height:       General exam: Conversant, in no acute distress Respiratory system: normal chest rise, clear, no audible wheezing Cardiovascular system: regular rhythm, s1-s2 Gastrointestinal system: Nondistended, nontender, pos BS Central nervous system: No seizures, no tremors Extremities: No cyanosis, no joint deformities Skin: No rashes, no pallor Psychiatry: Affect normal // no auditory hallucinations   Data Reviewed:  Labs reviewed: Na 133, K 4.6, Cr 1.76, WBC 16.8, Hgb 7.5, Plts 145   Family Communication: Pt in room, family in room  Disposition: Status is: Inpatient Remains inpatient appropriate because: severity of illness  Planned Discharge Destination: Home   Author: Rickey Barbara, MD 03/07/2023 6:02 PM  For on call review [email or web address].

## 2023-03-07 NOTE — TOC Transition Note (Signed)
Transition of Care [facility]) - CM/SW Discharge Note   Patient Details  Name: ORMAN ALLOCCO MRN: [id number] Date of Birth: February 25, 1966  [facility] CM/SW Contact:  Amada Jupiter, LCSW Phone Number: 03/07/2023, 3:25 PM   Clinical Narrative:     Alerted by MD that order placed for home O2.  Pt and wife aware/ agreeable and have no DME agency preference.  Order placed with [employer] and portable tank has been delivered to room with concentrator to go to the home.  No further TOC needs.  Final next level of care: Home/Self Care Barriers to Discharge: No Barriers Identified   Patient Goals and CMS Choice      Discharge Placement                         Discharge Plan and Services Additional resources added to the After Visit Summary for                  DME Arranged: Oxygen DME Agency: Patsy Lager Date DME Agency Contacted: 03/07/23   Representative spoke with at DME Agency: Morrie Sheldon            Social Determinants of Health (SDOH) Interventions SDOH Screenings   Food Insecurity: No Food Insecurity (03/03/2023)  Housing: Low Risk  (03/03/2023)  Transportation Needs: No Transportation Needs (03/03/2023)  Utilities: Not At Risk (03/03/2023)  Tobacco Use: Low Risk  (03/03/2023)     Readmission Risk Interventions    03/06/2023   10:47 AM  Readmission Risk Prevention Plan  Transportation Screening Complete  PCP or Specialist Appt within 3-5 Days Complete  HRI or Home Care Consult Complete  Social Work Consult for Recovery Care Planning/Counseling Complete  Palliative Care Screening Not Applicable  Medication Review Oceanographer) Complete

## 2023-03-07 NOTE — Progress Notes (Addendum)
NP Modou and Virgel Manifold notified that patient's temp is 101.1 F after first unit of blood, PRN tylenol was given. NP Modou said to cool room down and remove any extra clothing and wait until tylenol kicks in and temp is normal before starting second unit of blood.

## 2023-03-07 NOTE — Plan of Care (Signed)
  Problem: Nutrition: Goal: Dietary intake will improve Outcome: Adequate for Discharge

## 2023-03-08 DIAGNOSIS — N179 Acute kidney failure, unspecified: Secondary | ICD-10-CM

## 2023-03-08 DIAGNOSIS — D649 Anemia, unspecified: Secondary | ICD-10-CM | POA: Insufficient documentation

## 2023-03-08 DIAGNOSIS — A419 Sepsis, unspecified organism: Secondary | ICD-10-CM | POA: Diagnosis not present

## 2023-03-08 DIAGNOSIS — G40909 Epilepsy, unspecified, not intractable, without status epilepticus: Secondary | ICD-10-CM

## 2023-03-08 DIAGNOSIS — E871 Hypo-osmolality and hyponatremia: Secondary | ICD-10-CM | POA: Insufficient documentation

## 2023-03-08 DIAGNOSIS — R6520 Severe sepsis without septic shock: Secondary | ICD-10-CM | POA: Diagnosis not present

## 2023-03-08 DIAGNOSIS — Z8673 Personal history of transient ischemic attack (TIA), and cerebral infarction without residual deficits: Secondary | ICD-10-CM

## 2023-03-08 DIAGNOSIS — R6510 Systemic inflammatory response syndrome (SIRS) of non-infectious origin without acute organ dysfunction: Secondary | ICD-10-CM | POA: Diagnosis not present

## 2023-03-08 LAB — COMPREHENSIVE METABOLIC PANEL
ALT: 16 U/L (ref 0–44)
AST: 44 U/L — ABNORMAL HIGH (ref 15–41)
Albumin: 1.7 g/dL — ABNORMAL LOW (ref 3.5–5.0)
Alkaline Phosphatase: 174 U/L — ABNORMAL HIGH (ref 38–126)
Anion gap: 7 (ref 5–15)
BUN: 18 mg/dL (ref 6–20)
CO2: 16 mmol/L — ABNORMAL LOW (ref 22–32)
Calcium: 7.1 mg/dL — ABNORMAL LOW (ref 8.9–10.3)
Chloride: 111 mmol/L (ref 98–111)
Creatinine, Ser: 1.51 mg/dL — ABNORMAL HIGH (ref 0.61–1.24)
GFR, Estimated: 54 mL/min — ABNORMAL LOW (ref >60)
Glucose, Bld: 111 mg/dL — ABNORMAL HIGH (ref 70–99)
Potassium: 4.4 mmol/L (ref 3.5–5.1)
Sodium: 134 mmol/L — ABNORMAL LOW (ref 135–145)
Total Bilirubin: 0.8 mg/dL (ref 0.3–1.2)
Total Protein: 5.7 g/dL — ABNORMAL LOW (ref 6.5–8.1)

## 2023-03-08 LAB — DIFFERENTIAL
Abs Immature Granulocytes: 0.3 10*3/uL — ABNORMAL HIGH (ref 0.00–0.07)
Basophils Absolute: 0 10*3/uL (ref 0.0–0.1)
Basophils Relative: 0 %
Eosinophils Absolute: 0.1 10*3/uL (ref 0.0–0.5)
Eosinophils Relative: 1 %
Immature Granulocytes: 2 %
Lymphocytes Relative: 12 %
Lymphs Abs: 2.1 10*3/uL (ref 0.7–4.0)
Monocytes Absolute: 1.6 10*3/uL — ABNORMAL HIGH (ref 0.1–1.0)
Monocytes Relative: 9 %
Neutro Abs: 13.5 10*3/uL — ABNORMAL HIGH (ref 1.7–7.7)
Neutrophils Relative %: 76 %

## 2023-03-08 LAB — CBC
HCT: 31.5 % — ABNORMAL LOW (ref 39.0–52.0)
Hemoglobin: 9.9 g/dL — ABNORMAL LOW (ref 13.0–17.0)
MCH: 28 pg (ref 26.0–34.0)
MCHC: 31.4 g/dL (ref 30.0–36.0)
MCV: 89.2 fL (ref 80.0–100.0)
Platelets: 138 10*3/uL — ABNORMAL LOW (ref 150–400)
RBC: 3.53 MIL/uL — ABNORMAL LOW (ref 4.22–5.81)
RDW: 18.3 % — ABNORMAL HIGH (ref 11.5–15.5)
WBC: 17.6 10*3/uL — ABNORMAL HIGH (ref 4.0–10.5)
nRBC: 0 % (ref 0.0–0.2)

## 2023-03-08 LAB — GLUCOSE, CAPILLARY
Glucose-Capillary: 124 mg/dL — ABNORMAL HIGH (ref 70–99)
Glucose-Capillary: 101 mg/dL — ABNORMAL HIGH (ref 70–99)
Glucose-Capillary: 122 mg/dL — ABNORMAL HIGH (ref 70–99)
Glucose-Capillary: 133 mg/dL — ABNORMAL HIGH (ref 70–99)
Glucose-Capillary: 141 mg/dL — ABNORMAL HIGH (ref 70–99)

## 2023-03-08 LAB — CULTURE, BLOOD (ROUTINE X 2)
Culture: NO GROWTH
Culture: NO GROWTH
Special Requests: ADEQUATE
Special Requests: ADEQUATE

## 2023-03-08 LAB — MAGNESIUM: Magnesium: 1.7 mg/dL (ref 1.7–2.4)

## 2023-03-08 NOTE — Plan of Care (Signed)
?  Problem: Education: Goal: Knowledge of disease or condition will improve Outcome: Progressing   Problem: Health Behavior/Discharge Planning: Goal: Ability to manage health-related needs will improve Outcome: Progressing   

## 2023-03-08 NOTE — Progress Notes (Signed)
Progress Note    Jeff [name]   PIR:[id number]  DOB: 1965/06/26  DOA: 03/03/2023     5 PCP: Jeff Curia, MD  Initial CC: nausea and fatigue  Hospital Course: Jeff [name] is a 57 yo male with PMH HTN, hypothyroidism, depression, epilepsy, metastatic colon adenocarcinoma. He initially presented with nausea and fatigue.  There was concern for possible sepsis on admission and he was started on empiric antibiotics.  Interval History:  No events overnight.  Resting comfortably when seen this morning.  No distress.  Did not feel hot nor sweaty nor have chills with noted fever yesterday. Denies any diarrhea or pain complaints.  Tolerating diet well, voiding well.  Assessment and Plan:  SIRS - presented with leukocytosis, tachycardia, elevated lactic.  No obvious source on admission and patient was started on empiric antibiotics.  CXR and UA negative for signs of infection.  Blood cultures have remained negative -Last dose of chemo 02/21/2023.  Suspect has hit nadir and bone marrow rebounding.  Also at risk for reactive state/inflammation in setting of malignancy - has completed 6 days of vanc/cefepime/flagyl and appears nontoxic at this time with no symptoms. In context of this, favor to d/c abx at this time and monitor clinically especially with no clear source - also s/p paracentesis on 10/22 with 3.9 L removed. PMN technically 685 but no symptoms consistent with SBP and likely is reactive in nature too (culture also negative to date) - fever can be seen in setting of malignancy; trend for now and trend CBC w/ diff   AKI-likely due to relative hypotension, and dehydration in the setting of sepsis - s/p IVF - continue trending creat   Metastatic sigmoid adenocarcinoma -now unfortunately with rapid progression despite multiple chemotherapy cycles, most recently FOLFOX and is felt to have severely limited standard treatment options.  He has been referred to Jeff [name] to consider  enrollment in clinical trial.   Lactic acidosis-potentially related to volume depletion vs malignancy or combo. Low suspicion for infection    Hyponatremia-likely due to relative dehydration, asymptomatic.  -Improved with hydration   Insulin-dependent type 2 diabetes -continue basal bolus insulin dosing, carb controlled diet   History of embolic CVA -continue home Eliquis   Hyperlipidemia -Cont Lipitor   Epilepsy -continue home Keppra 750 mg p.o. twice daily   Acquired hypothyroidism -continue Synthroid   Anemia of malignancy -No evidence of acute blood loss - s/p 2 units PRBC on 10/22    Old records reviewed in assessment of this patient  Antimicrobials: Cefepime 03/03/2023 >> 03/08/2023 Flagyl 03/03/2023 >> 03/08/2023 Vancomycin 03/03/2023 >> 03/07/2023  DVT prophylaxis:   apixaban (ELIQUIS) tablet 5 mg   Code Status:   Code Status: Full Code  Mobility Assessment (Last 72 Hours)     Mobility Assessment     Row Name 03/08/23 0900 03/07/23 2000 03/07/23 1100 03/06/23 2042 03/06/23 0830   Does patient have an order for bedrest or is patient medically unstable No - Continue assessment No - Continue assessment No - Continue assessment No - Continue assessment No - Continue assessment   What is the highest level of mobility based on the progressive mobility assessment? Level 5 (Walks with assist in room/hall) - Balance while stepping forward/back and can walk in room with assist - Complete Level 5 (Walks with assist in room/hall) - Balance while stepping forward/back and can walk in room with assist - Complete Level 5 (Walks with assist in room/hall) - Balance while stepping forward/back and can walk  in room with assist - Complete Level 5 (Walks with assist in room/hall) - Balance while stepping forward/back and can walk in room with assist - Complete Level 5 (Walks with assist in room/hall) - Balance while stepping forward/back and can walk in room with assist - Complete     Row Name 03/05/23 2052           Does patient have an order for bedrest or is patient medically unstable No - Continue assessment       What is the highest level of mobility based on the progressive mobility assessment? Level 5 (Walks with assist in room/hall) - Balance while stepping forward/back and can walk in room with assist - Complete                Barriers to discharge: none Disposition Plan:  Home Status is: Inpt  Objective: Blood pressure 95/63, pulse 97, temperature 99.4 F (37.4 C), temperature source Oral, resp. rate 19, height 5\' 10"  (1.778 m), weight 93 kg, SpO2 100%.  Examination:  Physical Exam Constitutional:      General: He is not in acute distress.    Appearance: Normal appearance.  HENT:     Head: Normocephalic and atraumatic.     Mouth/Throat:     Mouth: Mucous membranes are moist.  Eyes:     Extraocular Movements: Extraocular movements intact.  Cardiovascular:     Rate and Rhythm: Normal rate and regular rhythm.  Pulmonary:     Effort: Pulmonary effort is normal. No respiratory distress.     Breath sounds: Normal breath sounds. No wheezing.  Abdominal:     General: Bowel sounds are normal. There is no distension.     Palpations: Abdomen is soft.     Tenderness: There is no abdominal tenderness.  Musculoskeletal:        General: Normal range of motion.     Cervical back: Normal range of motion and neck supple.  Skin:    General: Skin is warm and dry.  Neurological:     General: No focal deficit present.     Mental Status: He is alert.  Psychiatric:        Mood and Affect: Mood normal.        Behavior: Behavior normal.      Consultants:    Procedures:    Data Reviewed: Results for orders placed or performed during the hospital encounter of 03/03/23 (from the past 24 hour(s))  Comprehensive metabolic panel     Status: Abnormal   Collection Time: 03/07/23  3:37 PM  Result Value Ref Range   Sodium 133 (L) 135 - 145 mmol/L    Potassium 4.6 3.5 - 5.1 mmol/L   Chloride 110 98 - 111 mmol/L   CO2 15 (L) 22 - 32 mmol/L   Glucose, Bld 163 (H) 70 - 99 mg/dL   BUN 20 6 - 20 mg/dL   Creatinine, Ser 1.19 (H) 0.61 - 1.24 mg/dL   Calcium 7.4 (L) 8.9 - 10.3 mg/dL   Total Protein 6.2 (L) 6.5 - 8.1 g/dL   Albumin 1.9 (L) 3.5 - 5.0 g/dL   AST 48 (H) 15 - 41 U/L   ALT 19 0 - 44 U/L   Alkaline Phosphatase 155 (H) 38 - 126 U/L   Total Bilirubin 0.6 0.3 - 1.2 mg/dL   GFR, Estimated 45 (L) >60 mL/min   Anion gap 8 5 - 15  CBC     Status: Abnormal   Collection Time: 03/07/23  3:37 PM  Result Value Ref Range   WBC 16.8 (H) 4.0 - 10.5 K/uL   RBC 2.77 (L) 4.22 - 5.81 MIL/uL   Hemoglobin 7.5 (L) 13.0 - 17.0 g/dL   HCT 16.1 (L) 09.6 - 04.5 %   MCV 92.1 80.0 - 100.0 fL   MCH 27.1 26.0 - 34.0 pg   MCHC 29.4 (L) 30.0 - 36.0 g/dL   RDW 40.9 (H) 81.1 - 91.4 %   Platelets 145 (L) 150 - 400 K/uL   nRBC 0.0 0.0 - 0.2 %  Magnesium     Status: None   Collection Time: 03/07/23  3:37 PM  Result Value Ref Range   Magnesium 1.7 1.7 - 2.4 mg/dL  Glucose, capillary     Status: Abnormal   Collection Time: 03/07/23  4:54 PM  Result Value Ref Range   Glucose-Capillary 122 (H) 70 - 99 mg/dL  Prepare RBC (crossmatch)     Status: None   Collection Time: 03/07/23  5:35 PM  Result Value Ref Range   Order Confirmation      ORDER PROCESSED BY BLOOD BANK Performed at [facility], [address], [place], [place] [address]   Type and screen [facility]     Status: None (Preliminary result)   Collection Time: 03/07/23  5:35 PM  Result Value Ref Range   ABO/RH(D) O POS    Antibody Screen NEG    Sample Expiration 03/10/2023,2359    Unit Number [id number]    Blood Component Type RBC LR PHER2    Unit division 00    Status of Unit ISSUED,FINAL    Transfusion Status OK TO TRANSFUSE    Crossmatch Result      Compatible Performed at [facility], [address], [place], [place]  [address]    Unit Number [id number]    Blood Component Type RED CELLS,LR    Unit division 00    Status of Unit ISSUED    Transfusion Status OK TO TRANSFUSE    Crossmatch Result Compatible   Glucose, capillary     Status: Abnormal   Collection Time: 03/07/23  9:53 PM  Result Value Ref Range   Glucose-Capillary 124 (H) 70 - 99 mg/dL  Differential     Status: Abnormal   Collection Time: 03/08/23  5:43 AM  Result Value Ref Range   Neutrophils Relative % 76 %   Neutro Abs 13.5 (H) 1.7 - 7.7 K/uL   Lymphocytes Relative 12 %   Lymphs Abs 2.1 0.7 - 4.0 K/uL   Monocytes Relative 9 %   Monocytes Absolute 1.6 (H) 0.1 - 1.0 K/uL   Eosinophils Relative 1 %   Eosinophils Absolute 0.1 0.0 - 0.5 K/uL   Basophils Relative 0 %   Basophils Absolute 0.0 0.0 - 0.1 K/uL   Immature Granulocytes 2 %   Abs Immature Granulocytes 0.30 (H) 0.00 - 0.07 K/uL  Comprehensive metabolic panel     Status: Abnormal   Collection Time: 03/08/23  7:01 AM  Result Value Ref Range   Sodium 134 (L) 135 - 145 mmol/L   Potassium 4.4 3.5 - 5.1 mmol/L   Chloride 111 98 - 111 mmol/L   CO2 16 (L) 22 - 32 mmol/L   Glucose, Bld 111 (H) 70 - 99 mg/dL   BUN 18 6 - 20 mg/dL   Creatinine, Ser 5.36 (H) 0.61 - 1.24 mg/dL   Calcium 7.1 (L) 8.9 - 10.3 mg/dL   Total Protein 5.7 (  L) 6.5 - 8.1 g/dL   Albumin 1.7 (L) 3.5 - 5.0 g/dL   AST 44 (H) 15 - 41 U/L   ALT 16 0 - 44 U/L   Alkaline Phosphatase 174 (H) 38 - 126 U/L   Total Bilirubin 0.8 0.3 - 1.2 mg/dL   GFR, Estimated 54 (L) >60 mL/min   Anion gap 7 5 - 15  CBC     Status: Abnormal   Collection Time: 03/08/23  7:01 AM  Result Value Ref Range   WBC 17.6 (H) 4.0 - 10.5 K/uL   RBC 3.53 (L) 4.22 - 5.81 MIL/uL   Hemoglobin 9.9 (L) 13.0 - 17.0 g/dL   HCT 16.1 (L) 09.6 - 04.5 %   MCV 89.2 80.0 - 100.0 fL   MCH 28.0 26.0 - 34.0 pg   MCHC 31.4 30.0 - 36.0 g/dL   RDW 40.9 (H) 81.1 - 91.4 %   Platelets 138 (L) 150 - 400 K/uL   nRBC 0.0 0.0 - 0.2 %  Magnesium     Status: None    Collection Time: 03/08/23  7:01 AM  Result Value Ref Range   Magnesium 1.7 1.7 - 2.4 mg/dL  Glucose, capillary     Status: Abnormal   Collection Time: 03/08/23  7:13 AM  Result Value Ref Range   Glucose-Capillary 101 (H) 70 - 99 mg/dL  Glucose, capillary     Status: Abnormal   Collection Time: 03/08/23 11:52 AM  Result Value Ref Range   Glucose-Capillary 122 (H) 70 - 99 mg/dL   *Note: Due to a large number of results and/or encounters for the requested time period, some results have not been displayed. A complete set of results can be found in Results Review.    I have reviewed pertinent nursing notes, vitals, labs, and images as necessary. I have ordered labwork to follow up on as indicated.  I have reviewed the last notes from staff over past 24 hours. I have discussed patient's care plan and test results with nursing staff, CM/SW, and other staff as appropriate.  Time spent: Greater than 50% of the 55 minute visit was spent in counseling/coordination of care for the patient as laid out in the A&P.   LOS: 5 days   Lewie Chamber, MD [facility] 03/08/2023, 2:24 PM

## 2023-03-08 NOTE — Progress Notes (Signed)
Jeff [name] was notified that patient's temp was 98.7 F before second transfusion started and after 15 minutes temp was 99.7 F. Verbal order by Jeff [name] to recheck temperature in 15 minutes and if it increase by one degree to stop blood and report it as adverse reaction.

## 2023-03-08 NOTE — Progress Notes (Signed)
Patient's temperature was 99.36F, PRBCs infusion was continued.

## 2023-03-08 NOTE — Progress Notes (Signed)
IP PROGRESS NOTE  Subjective:   Jeff [name] denies pain, nausea, and dyspnea this morning .  No bowel movement.  He underwent a paracentesis and received 2 units of packed red blood cells.  Objective: Vital signs in last 24 hours: Blood pressure 100/72, pulse 100, temperature 98.3 F (36.8 C), temperature source Oral, resp. rate 18, height 5\' 10"  (1.778 m), weight 205 lb (93 kg), SpO2 100%.  Intake/Output from previous day: 10/22 0701 - 10/23 0700 In: 2401 [P.O.:720; Blood:678; IV Piggyback:1003] Out: 500 [Urine:500]  Physical Exam:  HEENT: No thrush Lungs: Clear bilaterally Cardiac: Regular rate and rhythm Abdomen: distended, no hepatosplenomegaly, nontender, no mass Extremities: No leg edema   Portacath/PICC-without erythema  Lab Results: Recent Labs    03/07/23 1537 03/08/23 0701  WBC 16.8* 17.6*  HGB 7.5* 9.9*  HCT 25.5* 31.5*  PLT 145* 138*    BMET Recent Labs    03/07/23 1537 03/08/23 0701  NA 133* 134*  K 4.6 4.4  CL 110 111  CO2 15* 16*  GLUCOSE 163* 111*  BUN 20 18  CREATININE 1.76* 1.51*  CALCIUM 7.4* 7.1*    Lab Results  Component Value Date   CEA1 3.19 09/10/2020   CEA 86.38 (H) 02/21/2023    Medications: I have reviewed the patient's current medications.  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, biopsy confirmed adenocarcinoma         CTs chest, abdomen, and pelvis on 12/11/2015-indeterminate tiny pulmonary nodules, multiple liver metastases, small nodes in the sigmoid mesocolon Laparoscopic sigmoid colectomy, liver biopsy, Port-A-Cath placement 01/14/2016 Pathology sigmoid colon resection- colonic adenocarcinoma, 5.3 cm extending into pericolonic connective tissue, positive lymph-vascular invasion, positive perineural invasion, negative margins, metastatic carcinoma in 9 of 28 lymph nodes Pathology liver biopsy-metastatic colorectal adenocarcinoma MSI stable; mismatch  repair protein normal APC alteration, K RAS wild-type, no BRAF mutation Cycle 1 FOLFOX 02/02/2016 Cycle 2 FOLFOX 02/15/2016 Cycle 3 FOLFOX 02/29/2016 Cycle 4 FOLFOX 03/14/2016 Cycle 5 FOLFOX 03/28/2016 Cycle 6 FOLFOX 04/11/2016 (oxaliplatin held secondary to thrombocytopenia) 04/21/2016 restaging CTs-difficulty evaluating liver lesions due to hepatic steatosis. Stable right adrenal nodule. No adenopathy or local recurrence near the rectosigmoid anastomotic site. Cycle 7 FOLFOX 04/25/2016 MRI liver 05/02/2016-partial improvement in hepatic metastases Cycle 8 FOLFOX 05/10/2016 Cycle 9 FOLFOX 05/23/2016 (oxaliplatin held due to thrombocytopenia) Cycle 10 FOLFOX 06/06/2016 (oxaliplatin dose reduced due to thrombocytopenia) Cycle 11 FOLFOX 06/27/2016 (oxaliplatin held due to neuropathy) Cycle 12 FOLFOX 07/11/2016 (oxaliplatin held) Initiation of maintenance Xeloda 7 days on/7 days off 07/27/2016 MRI liver 11/18/2016-decrease in hepatic metastatic disease. No new or progressive disease identified within the abdomen. Continuation of Xeloda 7 days on/7 days off MRI liver 04/27/2017-previous liver lesions not identified, no new lesions, no lymphadenopathy Xeloda continued 7 days on/7 days off MRI liver 12/04/2017 - no evidence of metastatic disease, hepatic steatosis Xeloda continued 7 days on/7 days off MRI liver 07/15/2018- no evidence of metastatic disease.  Stable severe hepatic steatosis. Xeloda continued 7 days on/7 days off MRI liver 03/16/2019-hepatic steatosis, no liver mass, focal area of intrahepatic biliary dilatation in segments 2 and 3 of the left lobe-increased Xeloda continued 7 days on/7 days off MRI abdomen 08/19/2019-no findings to suggest liver metastases.  Bilateral lung nodules measuring up to 2.8 cm, progressive and more conspicuous than on previous exam CT chest 08/29/2019-multiple pulmonary metastases, new from 04/21/2016 Cycle 1 FOLFIRI/bevacizumab 09/09/2019 Cycle 2  FOLFIRI/bevacizumab 09/26/2019  Cycle 3 FOLFIRI/bevacizumab 10/10/2019 Cycle 4 FOLFIRI/bevacizumab 10/24/2019 Cycle 5  FOLFIRI/bevacizumab 11/07/2019 CT chest 11/14/2019-decreased size of lung nodules, no new lesions, hepatic steatosis Cycle 6 FOLFIRI/bevacizumab 11/21/2019 Cycle 7 FOLFIRI/bevacizumab 12/05/2019 Cycle 8 FOLFIRI/bevacizumab 12/19/2019 Cycle 9 FOLFIRI/bevacizumab 01/02/2020 Cycle 10 FOLFIRI/bevacizumab 01/16/2020 CT chest 01/29/2020-stable bilateral pulmonary metastases.  No new or progressive metastatic disease in the chest. Cycle 11 FOLFIRI/bevacizumab 01/30/2020 Cycle 12 FOLFIRI/bevacizumab 02/19/2020 Cycle 13 FOLFIRI/bevacizumab 03/12/2020 Cycle 14 FOLFIRI/bevacizumab 04/02/2020 Cycle 15 FOLFIRI/bevacizumab 04/23/2020 Cycle 16 FOLFIRI/bevacizumab 05/21/2020 CT chest 06/09/2020-mild progression pulmonary metastasis.  Some lesions have increased in size while others are similar. Cycle 1 irinotecan/Panitumumab 06/18/2020 Cycle 2 irinotecan/Panitumumab 07/02/2020 Cycle 3 irinotecan/panitumumab 07/16/2020 Cycle 4 irinotecan/Panitumumab 07/30/2020 Cycle 5 irinotecan/Panitumumab 08/13/2020, Emend added Cycle 6 irinotecan/Panitumumab 08/27/2020 CT chest 09/08/2020-decreased size of pulmonary nodules, no progressive disease Cycle 7 irinotecan/panitumumab 09/02/2020 Cycle 8 irinotecan/panitumumab 09/24/2020 Cycle 9 irinotecan/Panitumumab 10/08/2020 10/22/2020 treatment held due to left foot fracture, need for surgery Cycle 10 irinotecan/panitumumab 11/24/2020 Cycle 11 irinotecan/Panitumumab 12/08/2020 Cycle 12 irinotecan/panitumumab 12/22/2020 Cycle 13 irinotecan/panitumumab 01/05/2021 01/20/2021 CT chest-mixed response with minimal increase in size of some lesions and minimal decrease in the size of other lesions.  Overall number of lesions is unchanged. Cycle 14 irinotecan/Panitumumab 02/05/2021 Cycle 15 irinotecan/Panitumumab 02/23/2021 Cycle 16 irinotecan/panitumumab 03/16/2021 Cycle 17 irinotecan/Panitumumab  04/13/2021 05/03/2021-CT chest-enlargement of pulmonary metastases, no new lesions 06/21/2021-cycle 1 Lonsurf/bevacizumab 07/19/2021-cycle 2 Lonsurf/bevacizumab 08/16/2021-cycle 3 Lonsurf/bevacizumab 09/09/2021 CT chest-bilateral lung nodules and masses with mixed response, overall stable to very minimally increased 09/13/2021 cycle 4 Lonsurf/bevacizumab 10/12/2021 cycle 5 Lonsurf/bevacizumab 10/21/2021 chest CT-bilateral pulmonary metastases without significant change.  No new or progressive disease within the chest. 11/08/2021 cycle 6 Lonsurf/bevacizumab 12/06/2021 cycle 7 Lonsurf/bevacizumab 01/03/2022 cycle 8 Lonsurf/bevacizumab CTs 01/25/2022-index lung lesion stable to mildly increased in size.  Mild retroperitoneal adenopathy, mildly increased in size compared to 12/16/2020. 02/01/2022 cycle 9 Lonsurf/bevacizumab 02/28/2022 cycle 10 Lonsurf/bevacizumab 03/28/2022 cycle 11 Lonsurf/bevacizumab CTs 04/21/2022-no change in bilateral pulmonary metastases, 2 new hypoattenuating liver lesions, new enlarged portacaval node Cycle 1 fruquintinib 05/24/2022 Cycle 2 fruquintinib 06/21/2022 Cycle 3 fruquintinib 07/19/2022 CTs 08/12/2022-stable lung nodules, hilar and retroperitoneal nodes, slight increase in size of liver metastases, stable right adrenal nodule Cycle 4 fruquintinib 08/16/2022 Cycle 5 fruquintinib 09/13/2022 Cycle 6 fruquintinib 10/11/2022 CTs 10/22/2022-progressive mediastinal, pericardial, and periportal adenopathy, enlargement of liver lesions, stable pulmonary nodules Cycle 1 FOLFOX 11/14/2022 Cycle 2 FOLFOX 11/29/2022, oxaliplatin dose reduced due to thrombocytopenia Cycle 3 FOLFOX 12/27/2022 Cycle 4 FOLFOX 01/10/2023 Cycle 5 FOLFOX 01/24/2023 Cycle 6 FOLFOX 02/07/2023 CTs 02/14/2023-progressive metastases in the lungs, liver, and chest/retroperitoneal lymph nodes and new peritoneal soft tissue nodules CT chest 03/03/2023-negative for pulmonary embolism, progression of metastatic lymphadenopathy and pleural  masses, new trace right pleural effusion, small pericardial effusion CT abdomen/pelvis 03/03/2023-progressive metastatic disease in the liver and abdominal lymph nodes, development of omental caking and new small to moderate volume ascites 03/07/2023-paracentesis   2.   Rectal bleeding and constipation secondary to #1   3.   History of peripheral vascular disease, status post left lower extremity vascular bypass surgery in April 2017   4.   History of nephrolithiasis   5.   History of Graves' disease treated with radioactive iodine   6.   Anxiety/depression   7.   Hypertension   8.   Hospitalization 01/19/2016 with wound dehiscence status post secondary suture closure of abdominal wall   9.   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 Hyperglycemia 06/20/2016-diagnosed with diabetes, maintained on insulin   11.  Positive COVID test 12/13/2018; positive  COVID test 01/25/2021   12.  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  Admission 03/03/2023 with nausea, fever, and tachycardia 14.  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  There has been recent clinical and radiologic evidence of disease progression.  He was admitted 03/03/2023 with nausea and tachycardia.  He feels better, but continues to have intermittent tachycardia and had a fever last night.  No source for infection has been identified, but the peritoneal fluid from 03/07/2023 appears exudative/inflammatory.  He could have peritonitis.  The Port-A-Cath does not appear infected.  Cultures are negative to date.    I think this more likely his symptoms and the fever are related to the systemic tumor burden.  He has anemia secondary to chronic disease, chemotherapy, renal insufficiency, and potentially bleeding.  The hemoglobin at is higher after 2 units of red blood cells  yesterday.  He was scheduled for discharge earlier this week, but given the peritoneal fluid findings, fever, and persistent tachycardia I think he should remain hospitalized.   Recommendations: Follow-up cultures, continue antibiotics Continue bowel regimen add lactulose if no bowel movement today Repeat blood cultures for a recurrent fever Follow-up with Dr. Maryruth Hancock as scheduled in 2 weeks. Consider infectious disease consult for a persistent fever    LOS: 5 days   Thornton Papas, MD   03/08/2023, 8:02 AM

## 2023-03-09 ENCOUNTER — Other Ambulatory Visit: Payer: Self-pay | Admitting: Nurse Practitioner

## 2023-03-09 ENCOUNTER — Inpatient Hospital Stay: Payer: Medicaid Other

## 2023-03-09 DIAGNOSIS — R6510 Systemic inflammatory response syndrome (SIRS) of non-infectious origin without acute organ dysfunction: Secondary | ICD-10-CM | POA: Diagnosis not present

## 2023-03-09 DIAGNOSIS — C189 Malignant neoplasm of colon, unspecified: Secondary | ICD-10-CM

## 2023-03-09 DIAGNOSIS — F410 Panic disorder [episodic paroxysmal anxiety] without agoraphobia: Secondary | ICD-10-CM | POA: Diagnosis not present

## 2023-03-09 DIAGNOSIS — R000 Tachycardia, unspecified: Secondary | ICD-10-CM

## 2023-03-09 DIAGNOSIS — N179 Acute kidney failure, unspecified: Secondary | ICD-10-CM | POA: Diagnosis not present

## 2023-03-09 DIAGNOSIS — C187 Malignant neoplasm of sigmoid colon: Principal | ICD-10-CM

## 2023-03-09 LAB — TYPE AND SCREEN
ABO/RH(D): O POS
Antibody Screen: NEGATIVE
Unit division: 0
Unit division: 0

## 2023-03-09 LAB — CBC WITH DIFFERENTIAL/PLATELET
Abs Immature Granulocytes: 0.51 10*3/uL — ABNORMAL HIGH (ref 0.00–0.07)
Basophils Absolute: 0 10*3/uL (ref 0.0–0.1)
Basophils Relative: 0 %
Eosinophils Absolute: 0 10*3/uL (ref 0.0–0.5)
Eosinophils Relative: 0 %
HCT: 31.1 % — ABNORMAL LOW (ref 39.0–52.0)
Hemoglobin: 9.8 g/dL — ABNORMAL LOW (ref 13.0–17.0)
Immature Granulocytes: 3 %
Lymphocytes Relative: 11 %
Lymphs Abs: 2 10*3/uL (ref 0.7–4.0)
MCH: 27.9 pg (ref 26.0–34.0)
MCHC: 31.5 g/dL (ref 30.0–36.0)
MCV: 88.6 fL (ref 80.0–100.0)
Monocytes Absolute: 1.5 10*3/uL — ABNORMAL HIGH (ref 0.1–1.0)
Monocytes Relative: 8 %
Neutro Abs: 14.2 10*3/uL — ABNORMAL HIGH (ref 1.7–7.7)
Neutrophils Relative %: 78 %
Platelets: 141 10*3/uL — ABNORMAL LOW (ref 150–400)
RBC: 3.51 MIL/uL — ABNORMAL LOW (ref 4.22–5.81)
RDW: 18.5 % — ABNORMAL HIGH (ref 11.5–15.5)
WBC: 18.3 10*3/uL — ABNORMAL HIGH (ref 4.0–10.5)
nRBC: 0 % (ref 0.0–0.2)

## 2023-03-09 LAB — BASIC METABOLIC PANEL
Anion gap: 8 (ref 5–15)
BUN: 24 mg/dL — ABNORMAL HIGH (ref 6–20)
CO2: 15 mmol/L — ABNORMAL LOW (ref 22–32)
Calcium: 7.4 mg/dL — ABNORMAL LOW (ref 8.9–10.3)
Chloride: 109 mmol/L (ref 98–111)
Creatinine, Ser: 1.64 mg/dL — ABNORMAL HIGH (ref 0.61–1.24)
GFR, Estimated: 49 mL/min — ABNORMAL LOW (ref >60)
Glucose, Bld: 124 mg/dL — ABNORMAL HIGH (ref 70–99)
Potassium: 4.8 mmol/L (ref 3.5–5.1)
Sodium: 132 mmol/L — ABNORMAL LOW (ref 135–145)

## 2023-03-09 LAB — GLUCOSE, CAPILLARY
Glucose-Capillary: 110 mg/dL — ABNORMAL HIGH (ref 70–99)
Glucose-Capillary: 133 mg/dL — ABNORMAL HIGH (ref 70–99)

## 2023-03-09 LAB — BPAM RBC
Blood Product Expiration Date: 202411212359
Blood Product Expiration Date: 202411212359
ISSUE DATE / TIME: 202410221947
ISSUE DATE / TIME: 202410230115
Unit Type and Rh: 5100
Unit Type and Rh: 202411212359

## 2023-03-09 LAB — MAGNESIUM: Magnesium: 1.8 mg/dL (ref 1.7–2.4)

## 2023-03-09 MED ORDER — LACTATED RINGERS IV SOLN: INTRAVENOUS | Status: DC

## 2023-03-09 MED ORDER — LACTATED RINGERS IV BOLUS: 1000.0000 mL | Freq: Once | INTRAVENOUS | Status: AC

## 2023-03-09 MED ORDER — ALBUMIN HUMAN 25 % IV SOLN
25.0000 g | Freq: Four times a day (QID) | INTRAVENOUS | Status: DC
  Administered 2023-03-09: 25 g via INTRAVENOUS
  Filled 2023-03-09 (×2): qty 100

## 2023-03-09 MED ORDER — HEPARIN SOD (PORK) LOCK FLUSH 100 UNIT/ML IV SOLN
500.0000 [IU] | INTRAVENOUS | Status: AC | PRN
  Administered 2023-03-09: 500 [IU]

## 2023-03-09 NOTE — Plan of Care (Signed)
Problem: Education: Goal: Knowledge of disease or condition will improve Outcome: Adequate for Discharge Goal: Knowledge of secondary prevention will improve (MUST DOCUMENT ALL) Outcome: Adequate for Discharge Goal: Knowledge of patient specific risk factors will improve (Mark N/A or DELETE if not current risk factor) Outcome: Adequate for Discharge   Problem: Ischemic Stroke/TIA Tissue Perfusion: Goal: Complications of ischemic stroke/TIA will be minimized Outcome: Adequate for Discharge   Problem: Coping: Goal: Will verbalize positive feelings about self Outcome: Adequate for Discharge Goal: Will identify appropriate support needs Outcome: Adequate for Discharge   Problem: Health Behavior/Discharge Planning: Goal: Ability to manage health-related needs will improve Outcome: Adequate for Discharge Goal: Goals will be collaboratively established with patient/family Outcome: Adequate for Discharge   Problem: Self-Care: Goal: Ability to participate in self-care as condition permits will improve Outcome: Adequate for Discharge Goal: Verbalization of feelings and concerns over difficulty with self-care will improve Outcome: Adequate for Discharge Goal: Ability to communicate needs accurately will improve Outcome: Adequate for Discharge   Problem: Nutrition: Goal: Risk of aspiration will decrease Outcome: Adequate for Discharge Goal: Dietary intake will improve Outcome: Adequate for Discharge   Problem: Education: Goal: Ability to describe self-care measures that may prevent or decrease complications (Diabetes Survival Skills Education) will improve Outcome: Adequate for Discharge Goal: Individualized Educational Video(s) Outcome: Adequate for Discharge   Problem: Coping: Goal: Ability to adjust to condition or change in health will improve Outcome: Adequate for Discharge   Problem: Fluid Volume: Goal: Ability to maintain a balanced intake and output will  improve Outcome: Adequate for Discharge   Problem: Health Behavior/Discharge Planning: Goal: Ability to identify and utilize available resources and services will improve Outcome: Adequate for Discharge Goal: Ability to manage health-related needs will improve Outcome: Adequate for Discharge   Problem: Metabolic: Goal: Ability to maintain appropriate glucose levels will improve Outcome: Adequate for Discharge   Problem: Nutritional: Goal: Maintenance of adequate nutrition will improve Outcome: Adequate for Discharge Goal: Progress toward achieving an optimal weight will improve Outcome: Adequate for Discharge   Problem: Skin Integrity: Goal: Risk for impaired skin integrity will decrease Outcome: Adequate for Discharge   Problem: Tissue Perfusion: Goal: Adequacy of tissue perfusion will improve Outcome: Adequate for Discharge   Problem: Education: Goal: Knowledge of General Education information will improve Description: Including pain rating scale, medication(s)/side effects and non-pharmacologic comfort measures Outcome: Adequate for Discharge   Problem: Health Behavior/Discharge Planning: Goal: Ability to manage health-related needs will improve Outcome: Adequate for Discharge   Problem: Clinical Measurements: Goal: Ability to maintain clinical measurements within normal limits will improve Outcome: Adequate for Discharge Goal: Will remain free from infection Outcome: Adequate for Discharge Goal: Diagnostic test results will improve Outcome: Adequate for Discharge Goal: Respiratory complications will improve Outcome: Adequate for Discharge Goal: Cardiovascular complication will be avoided Outcome: Adequate for Discharge   Problem: Activity: Goal: Risk for activity intolerance will decrease Outcome: Adequate for Discharge   Problem: Nutrition: Goal: Adequate nutrition will be maintained Outcome: Adequate for Discharge   Problem: Coping: Goal: Level of  anxiety will decrease Outcome: Adequate for Discharge   Problem: Elimination: Goal: Will not experience complications related to bowel motility Outcome: Adequate for Discharge Goal: Will not experience complications related to urinary retention Outcome: Adequate for Discharge   Problem: Pain Managment: Goal: General experience of comfort will improve Outcome: Adequate for Discharge   Problem: Safety: Goal: Ability to remain free from injury will improve Outcome: Adequate for Discharge   Problem: Skin Integrity: Goal: Risk for   impaired skin integrity will decrease Outcome: Adequate for Discharge   

## 2023-03-09 NOTE — Progress Notes (Signed)
IP PROGRESS NOTE  Subjective:   Mr. Brechbiel reports having a "panic attack "last night.  He had dyspnea and tachycardia.  He feels well this morning.  He had 2 bowel movements yesterday.  Mild "soreness "in the upper abdomen.  Objective: Vital signs in last 24 hours: Blood pressure 91/63, pulse (!) 114, temperature 98.7 F (37.1 C), temperature source Oral, resp. rate 18, height 5\' 10"  (1.778 m), weight 205 lb (93 kg), SpO2 100%.  Intake/Output from previous day: 10/23 0701 - 10/24 0700 In: 770 [P.O.:660; I.V.:10; IV Piggyback:100] Out: 0   Physical Exam:  HEENT: No thrush  Cardiac: Regular rate and rhythm, tachycardia Abdomen: distended, no hepatosplenomegaly, mild mid upper abdominal tenderness, no mass Extremities: No leg edema   Portacath/PICC-without erythema  Lab Results: Recent Labs    03/08/23 0701 03/09/23 0434  WBC 17.6* 18.3*  HGB 9.9* 9.8*  HCT 31.5* 31.1*  PLT 138* 141*    BMET Recent Labs    03/08/23 0701 03/09/23 0434  NA 134* 132*  K 4.4 4.8  CL 111 109  CO2 16* 15*  GLUCOSE 111* 124*  BUN 18 24*  CREATININE 1.51* 1.64*  CALCIUM 7.1* 7.4*    Lab Results  Component Value Date   CEA1 3.19 09/10/2020   CEA 86.38 (H) 02/21/2023    Medications: I have reviewed the patient's current medications.  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, biopsy confirmed adenocarcinoma         CTs chest, abdomen, and pelvis on 12/11/2015-indeterminate tiny pulmonary nodules, multiple liver metastases, small nodes in the sigmoid mesocolon Laparoscopic sigmoid colectomy, liver biopsy, Port-A-Cath placement 01/14/2016 Pathology sigmoid colon resection- colonic adenocarcinoma, 5.3 cm extending into pericolonic connective tissue, positive lymph-vascular invasion, positive perineural invasion, negative margins, metastatic carcinoma in 9 of 28 lymph nodes Pathology liver biopsy-metastatic colorectal  adenocarcinoma MSI stable; mismatch repair protein normal APC alteration, K RAS wild-type, no BRAF mutation Cycle 1 FOLFOX 02/02/2016 Cycle 2 FOLFOX 02/15/2016 Cycle 3 FOLFOX 02/29/2016 Cycle 4 FOLFOX 03/14/2016 Cycle 5 FOLFOX 03/28/2016 Cycle 6 FOLFOX 04/11/2016 (oxaliplatin held secondary to thrombocytopenia) 04/21/2016 restaging CTs-difficulty evaluating liver lesions due to hepatic steatosis. Stable right adrenal nodule. No adenopathy or local recurrence near the rectosigmoid anastomotic site. Cycle 7 FOLFOX 04/25/2016 MRI liver 05/02/2016-partial improvement in hepatic metastases Cycle 8 FOLFOX 05/10/2016 Cycle 9 FOLFOX 05/23/2016 (oxaliplatin held due to thrombocytopenia) Cycle 10 FOLFOX 06/06/2016 (oxaliplatin dose reduced due to thrombocytopenia) Cycle 11 FOLFOX 06/27/2016 (oxaliplatin held due to neuropathy) Cycle 12 FOLFOX 07/11/2016 (oxaliplatin held) Initiation of maintenance Xeloda 7 days on/7 days off 07/27/2016 MRI liver 11/18/2016-decrease in hepatic metastatic disease. No new or progressive disease identified within the abdomen. Continuation of Xeloda 7 days on/7 days off MRI liver 04/27/2017-previous liver lesions not identified, no new lesions, no lymphadenopathy Xeloda continued 7 days on/7 days off MRI liver 12/04/2017 - no evidence of metastatic disease, hepatic steatosis Xeloda continued 7 days on/7 days off MRI liver 07/15/2018- no evidence of metastatic disease.  Stable severe hepatic steatosis. Xeloda continued 7 days on/7 days off MRI liver 03/16/2019-hepatic steatosis, no liver mass, focal area of intrahepatic biliary dilatation in segments 2 and 3 of the left lobe-increased Xeloda continued 7 days on/7 days off MRI abdomen 08/19/2019-no findings to suggest liver metastases.  Bilateral lung nodules measuring up to 2.8 cm, progressive and more conspicuous than on previous exam CT chest 08/29/2019-multiple pulmonary metastases, new from 04/21/2016 Cycle 1  FOLFIRI/bevacizumab 09/09/2019 Cycle 2 FOLFIRI/bevacizumab 09/26/2019  Cycle 3 FOLFIRI/bevacizumab 10/10/2019 Cycle 4 FOLFIRI/bevacizumab 10/24/2019 Cycle 5 FOLFIRI/bevacizumab 11/07/2019 CT chest 11/14/2019-decreased size of lung nodules, no new lesions, hepatic steatosis Cycle 6 FOLFIRI/bevacizumab 11/21/2019 Cycle 7 FOLFIRI/bevacizumab 12/05/2019 Cycle 8 FOLFIRI/bevacizumab 12/19/2019 Cycle 9 FOLFIRI/bevacizumab 01/02/2020 Cycle 10 FOLFIRI/bevacizumab 01/16/2020 CT chest 01/29/2020-stable bilateral pulmonary metastases.  No new or progressive metastatic disease in the chest. Cycle 11 FOLFIRI/bevacizumab 01/30/2020 Cycle 12 FOLFIRI/bevacizumab 02/19/2020 Cycle 13 FOLFIRI/bevacizumab 03/12/2020 Cycle 14 FOLFIRI/bevacizumab 04/02/2020 Cycle 15 FOLFIRI/bevacizumab 04/23/2020 Cycle 16 FOLFIRI/bevacizumab 05/21/2020 CT chest 06/09/2020-mild progression pulmonary metastasis.  Some lesions have increased in size while others are similar. Cycle 1 irinotecan/Panitumumab 06/18/2020 Cycle 2 irinotecan/Panitumumab 07/02/2020 Cycle 3 irinotecan/panitumumab 07/16/2020 Cycle 4 irinotecan/Panitumumab 07/30/2020 Cycle 5 irinotecan/Panitumumab 08/13/2020, Emend added Cycle 6 irinotecan/Panitumumab 08/27/2020 CT chest 09/08/2020-decreased size of pulmonary nodules, no progressive disease Cycle 7 irinotecan/panitumumab 09/02/2020 Cycle 8 irinotecan/panitumumab 09/24/2020 Cycle 9 irinotecan/Panitumumab 10/08/2020 10/22/2020 treatment held due to left foot fracture, need for surgery Cycle 10 irinotecan/panitumumab 11/24/2020 Cycle 11 irinotecan/Panitumumab 12/08/2020 Cycle 12 irinotecan/panitumumab 12/22/2020 Cycle 13 irinotecan/panitumumab 01/05/2021 01/20/2021 CT chest-mixed response with minimal increase in size of some lesions and minimal decrease in the size of other lesions.  Overall number of lesions is unchanged. Cycle 14 irinotecan/Panitumumab 02/05/2021 Cycle 15 irinotecan/Panitumumab 02/23/2021 Cycle 16 irinotecan/panitumumab  03/16/2021 Cycle 17 irinotecan/Panitumumab 04/13/2021 05/03/2021-CT chest-enlargement of pulmonary metastases, no new lesions 06/21/2021-cycle 1 Lonsurf/bevacizumab 07/19/2021-cycle 2 Lonsurf/bevacizumab 08/16/2021-cycle 3 Lonsurf/bevacizumab 09/09/2021 CT chest-bilateral lung nodules and masses with mixed response, overall stable to very minimally increased 09/13/2021 cycle 4 Lonsurf/bevacizumab 10/12/2021 cycle 5 Lonsurf/bevacizumab 10/21/2021 chest CT-bilateral pulmonary metastases without significant change.  No new or progressive disease within the chest. 11/08/2021 cycle 6 Lonsurf/bevacizumab 12/06/2021 cycle 7 Lonsurf/bevacizumab 01/03/2022 cycle 8 Lonsurf/bevacizumab CTs 01/25/2022-index lung lesion stable to mildly increased in size.  Mild retroperitoneal adenopathy, mildly increased in size compared to 12/16/2020. 02/01/2022 cycle 9 Lonsurf/bevacizumab 02/28/2022 cycle 10 Lonsurf/bevacizumab 03/28/2022 cycle 11 Lonsurf/bevacizumab CTs 04/21/2022-no change in bilateral pulmonary metastases, 2 new hypoattenuating liver lesions, new enlarged portacaval node Cycle 1 fruquintinib 05/24/2022 Cycle 2 fruquintinib 06/21/2022 Cycle 3 fruquintinib 07/19/2022 CTs 08/12/2022-stable lung nodules, hilar and retroperitoneal nodes, slight increase in size of liver metastases, stable right adrenal nodule Cycle 4 fruquintinib 08/16/2022 Cycle 5 fruquintinib 09/13/2022 Cycle 6 fruquintinib 10/11/2022 CTs 10/22/2022-progressive mediastinal, pericardial, and periportal adenopathy, enlargement of liver lesions, stable pulmonary nodules Cycle 1 FOLFOX 11/14/2022 Cycle 2 FOLFOX 11/29/2022, oxaliplatin dose reduced due to thrombocytopenia Cycle 3 FOLFOX 12/27/2022 Cycle 4 FOLFOX 01/10/2023 Cycle 5 FOLFOX 01/24/2023 Cycle 6 FOLFOX 02/07/2023 CTs 02/14/2023-progressive metastases in the lungs, liver, and chest/retroperitoneal lymph nodes and new peritoneal soft tissue nodules CT chest 03/03/2023-negative for pulmonary embolism, progression  of metastatic lymphadenopathy and pleural masses, new trace right pleural effusion, small pericardial effusion CT abdomen/pelvis 03/03/2023-progressive metastatic disease in the liver and abdominal lymph nodes, development of omental caking and new small to moderate volume ascites 03/07/2023-paracentesis   2.   Rectal bleeding and constipation secondary to #1   3.   History of peripheral vascular disease, status post left lower extremity vascular bypass surgery in April 2017   4.   History of nephrolithiasis   5.   History of Graves' disease treated with radioactive iodine   6.   Anxiety/depression   7.   Hypertension   8.   Hospitalization 01/19/2016 with wound dehiscence status post secondary suture closure of abdominal wall   9.   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 Hyperglycemia 06/20/2016-diagnosed with diabetes, maintained on  insulin   11.  Positive COVID test 12/13/2018; positive COVID test 01/25/2021   12.  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  Admission 03/03/2023 with nausea, fever, and tachycardia 14.  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  There has been recent clinical and radiologic evidence of disease progression.  He was admitted 03/03/2023 with nausea and tachycardia.  He feels better, but continues to have intermittent tachycardia.  He had hypotension reports a panic attack last night.  No source for infection has been identified, but the peritoneal fluid from 03/07/2023 appears exudative/inflammatory.  Blood and peritoneal fluid cultures remain negative.  The Port-A-Cath does not appear infected.  The peritoneal fluid cytology is pending.  I think this more likely his symptoms and the fever are related to the systemic tumor burden.  He has completed a course of antibiotics.  He has anemia  secondary to chronic disease, chemotherapy, renal insufficiency, and potentially bleeding.  The hemoglobin at is higher after 2 units of red blood cells .  I recommend pushing fluids and nutrition.  He can likely be discharged if his blood pressure remains adequate and he is ambulatory.  We will schedule outpatient follow-up at the Cancer center for next week.  Recommendations: Consider dosing the dose of Flomax as this could be contributing to hypotension Follow-up with Dr. Maryruth Hancock as scheduled  Outpatient follow-up will be scheduled [facility] for 03/14/2023.    LOS: 6 days   Thornton Papas, MD   03/09/2023, 7:20 AM

## 2023-03-09 NOTE — Progress Notes (Signed)
AVS reviewed w/ patient & wife - both verbalized an understanding- pt's AVS updated in pen w/ time of Ativan dose given here at 1530. No other questions at this time.  Port deaccessed prior to this RN arrival to room. Pt dressing for d/c to home. Home O2 tank in room.

## 2023-03-09 NOTE — Discharge Summary (Signed)
Physician Discharge Summary   Jeff [name] JWJ:[id number] DOB: 1966-02-08 DOA: 03/03/2023  PCP: Simone Curia, MD  Admit date: 03/03/2023 Discharge date: 03/09/2023  Admitted From: Home Disposition:  Home Discharging physician: Lewie Chamber, MD Barriers to discharge: none  Recommendations at discharge: Repeat BMP and CBC at follow up  Discharge Condition: stable CODE STATUS: Full Diet recommendation:  Diet Orders (From admission, onward)     Start     Ordered   03/09/23 0000  Diet Carb Modified        03/09/23 1220   03/03/23 2009  Diet Carb Modified Fluid consistency: Thin; Room service appropriate? Yes  Diet effective now       Question Answer Comment  Diet-HS Snack? Nothing   Calorie Level Medium 1600-2000   Fluid consistency: Thin   Room service appropriate? Yes      03/03/23 2009            Hospital Course: [name] is a 57 yo male with PMH HTN, hypothyroidism, depression, epilepsy, metastatic colon adenocarcinoma. He initially presented with nausea and fatigue.  There was concern for possible sepsis on admission and he was started on empiric antibiotics.  Assessment and Plan:  SIRS - presented with leukocytosis, tachycardia, elevated lactic.  No obvious source on admission and patient was started on empiric antibiotics.  CXR and UA negative for signs of infection.  Blood cultures have remained negative and finalized  -Last dose of chemo 02/21/2023.  Suspect has hit nadir and bone marrow rebounding.  Also at risk for reactive state/inflammation in setting of malignancy - completed 6 days of vanc/cefepime/flagyl and remained nontoxic at this time with no symptoms. In context of this, favor to d/c abx on 10/23 and monitor; patient did not develop any clinical worsening, nor become febrile; leukocytosis persisted - also s/p paracentesis on 10/22 with 3.9 L removed. PMN technically 685 but no symptoms consistent with SBP and likely is reactive in nature too  (culture also negative to date) - fever can be seen in setting of malignancy -Repeat CBC at follow-up   AKI-likely due to relative hypotension, and dehydration in the setting of sepsis - s/p IVF -Patient recommended to continue hydration at discharge - Repeat BMP at follow-up  Hypotension - mild and possibly due to some hypoalbuminemia  - lower chance of flomax contributing as hypoTN at rest and not necessarily orthostatic  - given IV albumin and further IV fluid bolus prior to discharge    Metastatic sigmoid adenocarcinoma -now unfortunately with rapid progression despite multiple chemotherapy cycles, most recently FOLFOX and is felt to have severely limited standard treatment options.  He has been referred to Dr. Maryruth Hancock to consider enrollment in clinical trial.   Lactic acidosis-potentially related to volume depletion vs malignancy or combo. 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 twice daily   Acquired hypothyroidism -continue Synthroid   Anemia of malignancy -No evidence of acute blood loss - s/p 2 units PRBC on 10/22    The patient's acute and chronic medical conditions were treated accordingly. On day of discharge, patient was felt deemed stable for discharge. Patient/family member advised to call PCP or come back to ER if needed.   Principal Diagnosis: SIRS (systemic inflammatory response syndrome) (HCC)  Discharge Diagnoses:  Active Hospital Problems   Diagnosis Date Noted   SIRS (systemic inflammatory response syndrome) (HCC) 03/03/2023    Priority: 1.   AKI (acute kidney injury) (HCC) 03/08/2023    Priority: 2.   Hyponatremia 03/08/2023   Epilepsy (HCC) 03/08/2023   History of CVA  (cerebrovascular accident) 03/08/2023   Normocytic anemia 03/08/2023   DMII (diabetes mellitus, type 2) (HCC) 08/10/2016   Cancer of sigmoid colon (HCC) 12/18/2015   Hypothyroidism following radioiodine therapy 05/26/2014   Hyperlipidemia 06/11/2012   Hypertension 03/16/2010    Resolved Hospital Problems  No resolved problems to display.     Discharge Instructions     Diet Carb Modified   Complete by: As directed    Increase activity slowly   Complete by: As directed       Allergies as of 03/09/2023       Reactions   Hydrocodone Hives        Medication List     STOP taking these medications    amLODipine 5 MG tablet Commonly known as: NORVASC       TAKE these medications    Accu-Chek Guide test strip Generic drug: glucose blood CHECK BLOOD SUGAR THREE TIMES DAILY AS DIRECTED   Accu-Chek Softclix Lancets lancets USE  TID TO CHECK BLOOD SUGAR LEVELS   apixaban 5 MG Tabs tablet Commonly known as: ELIQUIS Take 1 tablet (5 mg total) by mouth 2 (two) times daily.   atorvastatin 80 MG tablet Commonly known as: LIPITOR TAKE 1 TABLET BY MOUTH AT BEDTIME   benzonatate 100 MG capsule Commonly known as: TESSALON TAKE 1 CAPSULE BY MOUTH 2 TIMES DAILY AS NEEDED FOR COUGH. What changed: See the new instructions.   Claritin-D 24 Hour 10-240 MG 24 hr tablet Generic drug: loratadine-pseudoephedrine Take 1 tablet by mouth daily.   dapagliflozin propanediol 10 MG Tabs tablet Commonly known as: FARXIGA Take 10 mg by mouth daily.   diphenoxylate-atropine 2.5-0.025 MG tablet Commonly known as: LOMOTIL Take 1-2 tablets by mouth 4 (four) times daily as needed for diarrhea or loose stools.   docusate sodium 100 MG capsule Commonly known as: COLACE Take 100 mg by mouth 2 (two) times daily.   Global Ease Inject Pen Needles 31G X 5 MM Misc Generic drug: Insulin Pen Needle Inject 1 Syringe into the skin 4 (four) times daily.   levETIRAcetam 750 MG tablet Commonly  known as: KEPPRA Take 1 tablet twice a day What changed:  how much to take how to take this when to take this additional instructions   levothyroxine 137 MCG tablet Commonly known as: SYNTHROID Take 137 mcg by mouth daily before breakfast.   lidocaine-prilocaine cream Commonly known as: EMLA Apply 1 Application topically as needed. Apply to portacath site 1 hour prior to use What changed:  reasons to take this additional instructions   LORazepam 0.5 MG tablet Commonly known as: ATIVAN Take 1 tablet (0.5 mg total) by mouth every 8 (eight) hours as needed for anxiety.   meclizine 25 MG tablet Commonly known as: ANTIVERT Take 25 mg by mouth 3 (three) times daily as needed for dizziness.   metFORMIN 500 MG 24 hr tablet Commonly known as: GLUCOPHAGE-XR Take 1,000 mg by mouth in the morning and at bedtime.   metoprolol tartrate 100 MG tablet Commonly known as: LOPRESSOR Take 100 mg by mouth 2 (two) times daily.   mirtazapine 15 MG tablet Commonly known as: REMERON Take 1 tablet (15 mg total) by mouth at bedtime.  nitroGLYCERIN 0.4 MG SL tablet Commonly known as: NITROSTAT DISSOLVE 1 TABLET UNDER TONGUE EVERY 5 MINUTES AS NEEDED FOR CHEST PAIN. IF NO RELIEF AFTER 3 DOSES CALL 911. What changed:  how much to take how to take this when to take this reasons to take this additional instructions   NovoLOG FlexPen 100 UNIT/ML FlexPen Generic drug: insulin aspart Inject 18 Units into the skin 3 (three) times daily with meals as needed for high blood sugar.   ondansetron 8 MG tablet Commonly known as: ZOFRAN Take 1 tablet (8 mg total) by mouth every 8 (eight) hours as needed for nausea or vomiting.   oxyCODONE-acetaminophen 10-325 MG tablet Commonly known as: PERCOCET Take 1 tablet by mouth every 6 (six) hours as needed for pain.   pantoprazole 40 MG tablet Commonly known as: PROTONIX Take 40 mg by mouth 2 (two) times daily.   polyethylene glycol 17 g  packet Commonly known as: MIRALAX / GLYCOLAX Take 17 g by mouth 2 (two) times daily.   potassium chloride SA 20 MEQ tablet Commonly known as: KLOR-CON M TAKE 1 TABLET BY MOUTH DAILY.   promethazine 12.5 MG tablet Commonly known as: PHENERGAN Take 1 tablet (12.5 mg total) by mouth every 6 (six) hours as needed. What changed: reasons to take this   senna-docusate 8.6-50 MG tablet Commonly known as: Senokot-S Take 1 tablet by mouth at bedtime.   tamsulosin 0.4 MG Caps capsule Commonly known as: FLOMAX Take 0.4 mg by mouth 2 (two) times daily.   tiZANidine 4 MG tablet Commonly known as: ZANAFLEX Take 4 mg by mouth every 8 (eight) hours as needed for muscle spasms.   Evaristo Bury FlexTouch 200 UNIT/ML FlexTouch Pen Generic drug: insulin degludec Inject 80 Units into the skin at bedtime.   venlafaxine XR 75 MG 24 hr capsule Commonly known as: EFFEXOR-XR Take 225 mg by mouth daily.   Xtampza ER 13.5 MG C12a Generic drug: oxyCODONE ER Take 13.5 mg by mouth every 12 (twelve) hours.               Durable Medical Equipment  (From admission, onward)           Start     Ordered   03/08/23 0859  For home use only DME oxygen  Once       Comments: POC for C18.9, C78.7  Question Answer Comment  Length of Need 6 Months   Mode or (Route) Nasal cannula   Liters per Minute 2   Frequency Continuous (stationary and portable oxygen unit needed)   Oxygen delivery system Gas      03/08/23 0858            Follow-up Information     Simone Curia, MD Follow up in 2 week(s).   Specialty: Internal Medicine Why: Hospital follow up Contact information: [address] [place] [address] [phone]         Dr. Maryruth Hancock as scheduled Follow up.   Why: Hospital follow up               Allergies  Allergen Reactions   Hydrocodone Hives    Consultations: Oncology   Procedures:   Discharge Exam: BP 109/69   Pulse (!) 121   Temp 98.1 F (36.7  C)   Resp 18   Ht 5\' 10"  (1.778 m)   Wt 93 kg   SpO2 100%   BMI 29.41 kg/m  Physical Exam Constitutional:      General: He  is not in acute distress.    Appearance: Normal appearance.  HENT:     Head: Normocephalic and atraumatic.     Mouth/Throat:     Mouth: Mucous membranes are moist.  Eyes:     Extraocular Movements: Extraocular movements intact.  Cardiovascular:     Rate and Rhythm: Normal rate and regular rhythm.  Pulmonary:     Effort: Pulmonary effort is normal. No respiratory distress.     Breath sounds: Normal breath sounds. No wheezing.  Abdominal:     General: Bowel sounds are normal. There is no distension.     Palpations: Abdomen is soft.     Tenderness: There is no abdominal tenderness.  Musculoskeletal:        General: Normal range of motion.     Cervical back: Normal range of motion and neck supple.  Skin:    General: Skin is warm and dry.  Neurological:     General: No focal deficit present.     Mental Status: He is alert.  Psychiatric:        Mood and Affect: Mood normal.        Behavior: Behavior normal.      The results of significant diagnostics from this hospitalization (including imaging, microbiology, ancillary and laboratory) are listed below for reference.   Microbiology: Recent Results (from the past 240 hour(s))  MRSA Next Gen by PCR, Nasal     Status: None   Collection Time: 03/03/23 12:24 AM   Specimen: Nasal Mucosa; Nasal Swab  Result Value Ref Range Status   MRSA by PCR Next Gen NOT DETECTED NOT DETECTED Final    Comment: (NOTE) The GeneXpert MRSA Assay (FDA approved for NASAL specimens only), is one component of a comprehensive MRSA colonization surveillance program. It is not intended to diagnose MRSA infection nor to guide or monitor treatment for MRSA infections. Test performance is not FDA approved in patients less than 16 years old. Performed at [facility], [address] [place], [place] [address]    Resp panel by RT-PCR (RSV, Flu A&B, Covid) Anterior Nasal Swab     Status: None   Collection Time: 03/03/23  2:50 PM   Specimen: Anterior Nasal Swab  Result Value Ref Range Status   SARS Coronavirus 2 by RT PCR NEGATIVE NEGATIVE Final    Comment: (NOTE) SARS-CoV-2 target nucleic acids are NOT DETECTED.  The SARS-CoV-2 RNA is generally detectable in upper respiratory specimens during the acute phase of infection. The lowest concentration of SARS-CoV-2 viral copies this assay can detect is 138 copies/mL. A negative result does not preclude SARS-Cov-2 infection and should not be used as the sole basis for treatment or other patient management decisions. A negative result may occur with  improper specimen collection/handling, submission of specimen other than nasopharyngeal swab, presence of viral mutation(s) within the areas targeted by this assay, and inadequate number of viral copies(<138 copies/mL). A negative result must be combined with clinical observations, patient history, and epidemiological information. The expected result is Negative.  Fact Sheet for Patients:  BloggerCourse.com  Fact Sheet for Healthcare Providers:  SeriousBroker.it  This test is no t yet approved or cleared by the [place] FDA and  has been authorized for detection and/or diagnosis of SARS-CoV-2 by FDA under an Emergency Use Authorization (EUA). This EUA will remain  in effect (meaning this test can be used) for the duration of the COVID-19 declaration under Section 564(b)(1) of the Act, 21 U.S.C.section 360bbb-3(b)(1), unless the authorization  is terminated  or revoked sooner.       Influenza A by PCR NEGATIVE NEGATIVE Final   Influenza B by PCR NEGATIVE NEGATIVE Final    Comment: (NOTE) The Xpert Xpress SARS-CoV-2/FLU/RSV plus assay is intended as an aid in the diagnosis of influenza from Nasopharyngeal swab specimens and should not be used  as a sole basis for treatment. Nasal washings and aspirates are unacceptable for Xpert Xpress SARS-CoV-2/FLU/RSV testing.  Fact Sheet for Patients: BloggerCourse.com  Fact Sheet for Healthcare Providers: SeriousBroker.it  This test is not yet approved or cleared by the [place] FDA and has been authorized for detection and/or diagnosis of SARS-CoV-2 by FDA under an Emergency Use Authorization (EUA). This EUA will remain in effect (meaning this test can be used) for the duration of the COVID-19 declaration under Section 564(b)(1) of the Act, 21 U.S.C. section 360bbb-3(b)(1), unless the authorization is terminated or revoked.     Resp Syncytial Virus by PCR NEGATIVE NEGATIVE Final    Comment: (NOTE) Fact Sheet for Patients: BloggerCourse.com  Fact Sheet for Healthcare Providers: SeriousBroker.it  This test is not yet approved or cleared by the [place] FDA and has been authorized for detection and/or diagnosis of SARS-CoV-2 by FDA under an Emergency Use Authorization (EUA). This EUA will remain in effect (meaning this test can be used) for the duration of the COVID-19 declaration under Section 564(b)(1) of the Act, 21 U.S.C. section 360bbb-3(b)(1), unless the authorization is terminated or revoked.  Performed at [facility], [address] [place], [place] [address]   Blood culture (routine x 2)     Status: None   Collection Time: 03/03/23  3:15 PM   Specimen: BLOOD  Result Value Ref Range Status   Specimen Description   Final    BLOOD LEFT ANTECUBITAL Performed at  [facility], [address], [place], [place] [address]    Special Requests   Final    BOTTLES DRAWN AEROBIC AND ANAEROBIC Blood Culture adequate volume Performed at [facility], [address], [place], [place] [address]    Culture   Final     NO GROWTH 5 DAYS Performed at [facility], [address], [place], [place] [address]    Report Status 03/08/2023 FINAL  Final  Blood culture (routine x 2)     Status: None   Collection Time: 03/03/23  3:17 PM   Specimen: BLOOD  Result Value Ref Range Status   Specimen Description   Final    BLOOD RIGHT ANTECUBITAL Performed at [facility], [address], [place], [place] [address]    Special Requests   Final    BOTTLES DRAWN AEROBIC AND ANAEROBIC Blood Culture adequate volume Performed at [facility] [address], [place], [place] [address]    Culture   Final    NO GROWTH 5 DAYS Performed at [facility], [address], [place], [place] [address]    Report Status 03/08/2023 FINAL  Final  Body fluid culture w Gram Stain     Status: None (Preliminary result)   Collection Time: 03/07/23 12:29 PM   Specimen: PATH Cytology Peritoneal fluid  Result Value Ref Range Status   Specimen Description   Final    PERITONEAL Performed at [facility], [address], [place], [place] [address]    Special Requests   Final    NONE Performed at [facility], [address] [place], [place] [address]    Gram Stain NO WBC SEEN NO ORGANISMS SEEN   Final   Culture   Final    NO GROWTH 2 DAYS Performed at [facility], [address] [place], [place] [address]    Report Status PENDING  Incomplete     Labs: BNP (last 3 results) No results for input(s): "BNP" in the last 8760 hours. Basic Metabolic Panel: Recent Labs  Lab 03/05/23 0239 03/06/23 0300 03/07/23 1537 03/08/23 0701 03/09/23 0434  NA 133* 134* 133* 134* 132*  K 4.1 4.3 4.6 4.4 4.8  CL 106 109 110 111 109  CO2 19* 17* 15* 16* 15*  GLUCOSE 119* 137* 163* 111* 124*  BUN 14 13 20 18  24*  CREATININE 1.40* 1.27* 1.76* 1.51* 1.64*  CALCIUM 7.2* 7.4* 7.4* 7.1* 7.4*  MG  --   --  1.7 1.7 1.8   Liver Function  Tests: Recent Labs  Lab 03/04/23 0430 03/05/23 0239 03/06/23 0300 03/07/23 1537 03/08/23 0701  AST 45* 44* 50* 48* 44*  ALT 27 23 22 19 16   ALKPHOS 140* 153* 155* 155* 174*  BILITOT 0.8 0.5 0.8 0.6 0.8  PROT 6.8 6.1* 6.2* 6.2* 5.7*  ALBUMIN 2.0* 1.9* 2.1* 1.9* 1.7*   Recent Labs  Lab 03/03/23 1444  LIPASE 24   No results for input(s): "AMMONIA" in the last 168 hours. CBC: Recent Labs  Lab 03/03/23 1444 03/04/23 0430 03/05/23 0239 03/06/23 0300 03/07/23 1537 03/08/23 0543 03/08/23 0701 03/09/23 0434  WBC 18.7*   < > 15.0* 16.0* 16.8*  --  17.6* 18.3*  NEUTROABS 13.4*  --   --   --   --  13.5*  --  14.2*  HGB 9.8*   < > 7.7* 8.1* 7.5*  --  9.9* 9.8*  HCT 31.6*   < > 26.4* 27.7* 25.5*  --  31.5* 31.1*  MCV 88.0   < > 93.6 92.0 92.1  --  89.2 88.6  PLT 238   < > 163 167 145*  --  138* 141*   < > = values in this interval not displayed.   Cardiac Enzymes: No results for input(s): "CKTOTAL", "CKMB", "CKMBINDEX", "TROPONINI" in the last 168 hours. BNP: Invalid input(s): "POCBNP" CBG: Recent Labs  Lab 03/08/23 1152 03/08/23 1629 03/08/23 2211 03/09/23 0721 03/09/23 1145  GLUCAP 122* 133* 141* 110* 133*   D-Dimer No results for input(s): "DDIMER" in the last 72 hours. Hgb A1c No results for input(s): "HGBA1C" in the last 72 hours. Lipid Profile No results for input(s): "CHOL", "HDL", "LDLCALC", "TRIG", "CHOLHDL", "LDLDIRECT" in the last 72 hours. Thyroid function studies No results for input(s): "TSH", "T4TOTAL", "T3FREE", "THYROIDAB" in the last 72 hours.  Invalid input(s): "FREET3" Anemia work up No results for input(s): "VITAMINB12", "FOLATE", "FERRITIN", "TIBC", "IRON", "RETICCTPCT" in the last 72 hours. Urinalysis    Component Value Date/Time   COLORURINE YELLOW 03/04/2023 1035   APPEARANCEUR CLEAR 03/04/2023 1035   LABSPEC 1.026 03/04/2023 1035   PHURINE 5.0 03/04/2023 1035   GLUCOSEU NEGATIVE 03/04/2023 1035   HGBUR NEGATIVE 03/04/2023 1035    BILIRUBINUR NEGATIVE 03/04/2023 1035   KETONESUR NEGATIVE 03/04/2023 1035   PROTEINUR NEGATIVE 03/04/2023 1035   UROBILINOGEN 0.2 01/06/2015 0547   NITRITE NEGATIVE 03/04/2023 1035   LEUKOCYTESUR TRACE (A) 03/04/2023 1035   Sepsis Labs Recent Labs  Lab 03/06/23 0300 03/07/23 1537 03/08/23 0701 03/09/23 0434  WBC 16.0* 16.8* 17.6* 18.3*   Microbiology Recent Results (from the  past 240 hour(s))  MRSA Next Gen by PCR, Nasal     Status: None   Collection Time: 03/03/23 12:24 AM   Specimen: Nasal Mucosa; Nasal Swab  Result Value Ref Range Status   MRSA by PCR Next Gen NOT DETECTED NOT DETECTED Final    Comment: (NOTE) The GeneXpert MRSA Assay (FDA approved for NASAL specimens only), is one component of a comprehensive MRSA colonization surveillance program. It is not intended to diagnose MRSA infection nor to guide or monitor treatment for MRSA infections. Test performance is not FDA approved in patients less than 46 years old. Performed at [facility], [address] [place], [place] [address]   Resp panel by RT-PCR (RSV, Flu A&B, Covid) Anterior Nasal Swab     Status: None   Collection Time: 03/03/23  2:50 PM   Specimen: Anterior Nasal Swab  Result Value Ref Range Status   SARS Coronavirus 2 by RT PCR NEGATIVE NEGATIVE Final    Comment: (NOTE) SARS-CoV-2 target nucleic acids are NOT DETECTED.  The SARS-CoV-2 RNA is generally detectable in upper respiratory specimens during the acute phase of infection. The lowest concentration of SARS-CoV-2 viral copies this assay can detect is 138 copies/mL. A negative result does not preclude SARS-Cov-2 infection and should not be used as the sole basis for treatment or other patient management decisions. A negative result may occur with  improper specimen collection/handling, submission of specimen other than nasopharyngeal swab, presence of viral mutation(s) within the areas targeted by this assay, and inadequate  number of viral copies(<138 copies/mL). A negative result must be combined with clinical observations, patient history, and epidemiological information. The expected result is Negative.  Fact Sheet for Patients:  BloggerCourse.com  Fact Sheet for Healthcare Providers:  SeriousBroker.it  This test is no t yet approved or cleared by the [place] FDA and  has been authorized for detection and/or diagnosis of SARS-CoV-2 by FDA under an Emergency Use Authorization (EUA). This EUA will remain  in effect (meaning this test can be used) for the duration of the COVID-19 declaration under Section 564(b)(1) of the Act, 21 U.S.C.section 360bbb-3(b)(1), unless the authorization is terminated  or revoked sooner.       Influenza A by PCR NEGATIVE NEGATIVE Final   Influenza B by PCR NEGATIVE NEGATIVE Final    Comment: (NOTE) The Xpert Xpress SARS-CoV-2/FLU/RSV plus assay is intended as an aid in the diagnosis of influenza from Nasopharyngeal swab specimens and should not be used as a sole basis for treatment. Nasal washings and aspirates are unacceptable for Xpert Xpress SARS-CoV-2/FLU/RSV testing.  Fact Sheet for Patients: BloggerCourse.com  Fact Sheet for Healthcare Providers: SeriousBroker.it  This test is not yet approved or cleared by the [place] FDA and has been authorized for detection and/or diagnosis of SARS-CoV-2 by FDA under an Emergency Use Authorization (EUA). This EUA will remain in effect (meaning this test can be used) for the duration of the COVID-19 declaration under Section 564(b)(1) of the Act, 21 U.S.C. section 360bbb-3(b)(1), unless the authorization is terminated or revoked.     Resp Syncytial Virus by PCR NEGATIVE NEGATIVE Final    Comment: (NOTE) Fact Sheet for Patients: BloggerCourse.com  Fact Sheet for Healthcare  Providers: SeriousBroker.it  This test is not yet approved or cleared by the [place] FDA and has been authorized for detection and/or diagnosis of SARS-CoV-2 by FDA under an Emergency Use Authorization (EUA). This EUA will remain in effect (meaning this test can be used) for  the duration of the COVID-19 declaration under Section 564(b)(1) of the Act, 21 U.S.C. section 360bbb-3(b)(1), unless the authorization is terminated or revoked.  Performed at [facility], [address], [place], [place] [address]   Blood culture (routine x 2)     Status: None   Collection Time: 03/03/23  3:15 PM   Specimen: BLOOD  Result Value Ref Range Status   Specimen Description   Final    BLOOD LEFT ANTECUBITAL Performed at [facility], [address], [place], [place] [address]    Special Requests   Final    BOTTLES DRAWN AEROBIC AND ANAEROBIC Blood Culture adequate volume Performed at [facility], [address], [place], [place] [address]    Culture   Final    NO GROWTH 5 DAYS Performed at [facility], [address] [place], [place] [address]    Report Status 03/08/2023 FINAL  Final  Blood culture (routine x 2)     Status: None   Collection Time: 03/03/23  3:17 PM   Specimen: BLOOD  Result Value Ref Range Status   Specimen Description   Final    BLOOD RIGHT ANTECUBITAL Performed at [facility] [facility], [address] [place], [place] [address]    Special Requests   Final    BOTTLES DRAWN AEROBIC AND ANAEROBIC Blood Culture adequate volume Performed at [facility], [address], [place], [place] [address]    Culture   Final    NO GROWTH 5 DAYS Performed at [facility], [address], [place], [place] [address]    Report Status 03/08/2023 FINAL  Final  Body fluid culture w Gram Stain     Status: None (Preliminary result)   Collection Time: 03/07/23  12:29 PM   Specimen: PATH Cytology Peritoneal fluid  Result Value Ref Range Status   Specimen Description   Final    PERITONEAL Performed at [facility], [address], [place], [place] [address]    Special Requests   Final    NONE Performed at [facility], [address] [place], [place] [address]    Gram Stain NO WBC SEEN NO ORGANISMS SEEN   Final   Culture   Final    NO GROWTH 2 DAYS Performed at [facility], [address], [place], [place] [address]    Report Status PENDING  Incomplete    Procedures/Studies: US Paracentesis  Result Date: 03/07/2023 INDICATION: The patient has a history of metastatic colon adenocarcinoma. Oncology requested a therapeutic and diagnostic paracentesis for ascites EXAM: ULTRASOUND GUIDE   DIAGNOSTIC AND THERAPEUTIC PARACENTESIS MEDICATIONS: None. COMPLICATIONS: None immediate. PROCEDURE: Informed written consent was obtained from the patient after a discussion of the risks, benefits and alternatives to treatment. A timeout was performed prior to the initiation of the procedure. Initial ultrasound scanning demonstrates a large amount of ascites within the LEFT lower abdominal quadrant. The LEFT lower abdomen was prepped and draped in the usual sterile fashion. 1% lidocaine was used for local anesthesia. Following this, a 19 gauge, 10-cm, Yueh catheter was introduced. An ultrasound image was saved for documentation purposes. The paracentesis was performed. The catheter was removed and a dressing was applied. The patient tolerated the procedure well without immediate post procedural complication. FINDINGS: A total of approximately 3.9 Liters of bloody fluid was removed. Samples were sent to the laboratory as requested by the clinical team. IMPRESSION: Successful ultrasound-guided DIAGNOSTIC  AND THERAPEUTIC paracentesis yielding 3.9 liters liters of peritoneal fluid. Performed by Ardith Dark, NP-C Electronically  Signed   By: Irish Lack M.D.   On: 03/07/2023 12:30   DG CHEST PORT 1 VIEW  Result Date: 03/06/2023 CLINICAL DATA:  Shortness of breath and hypoxia EXAM: PORTABLE CHEST 1 VIEW COMPARISON:  03/03/2023 FINDINGS: Patchy nodular densities are identified bilaterally consistent with metastatic disease. The overall appearance is similar to that seen 3 days previous. No new focal abnormality is noted. Right chest wall port is again seen. IMPRESSION: Stable pulmonary metastatic disease. Electronically Signed   By: Alcide Clever M.D.   On: 03/06/2023 22:08   CT ABDOMEN PELVIS W CONTRAST  Result Date: 03/03/2023 CLINICAL DATA:  Bowel obstruction suspected Patient with known history of metastatic colon cancer. EXAM: CT ABDOMEN AND PELVIS WITH CONTRAST TECHNIQUE: Multidetector CT imaging of the abdomen and pelvis was performed using the standard protocol following bolus administration of intravenous contrast. RADIATION DOSE REDUCTION: This exam was performed according to the departmental dose-optimization program which includes automated exposure control, adjustment of the mA and/or kV according to patient size and/or use of iterative reconstruction technique. CONTRAST:  75mL OMNIPAQUE IOHEXOL 350 MG/ML SOLN COMPARISON:  CT 02/14/2023 FINDINGS: Lower chest: Assessed on concurrent chest CT, reported separately. Hepatobiliary: Again seen known hepatic metastatic disease. Masses in the left lobe have coalesced from prior exam with slight increase in size, exact measurements difficult due to the infiltrative nature. Representative right lobe lesion measuring 2.9 x 2.1 cm series 4, image 22, previously measured 2.5 x 2.1 cm. There is no obvious portal vein invasion or thrombus. Small gallstones within partially distended gallbladder. No biliary dilatation. Small amount of perihepatic ascites is new. Pancreas: No ductal dilatation or inflammation. Spleen: Normal in size without focal abnormality. Small amount of  perisplenic ascites, new. Adrenals/Urinary Tract: Exophytic 12 mm right adrenal nodule versus adjacent lymph node is grossly stable. Normal left adrenal gland. Punctate right and 5 mm nonobstructing left renal calculi. No hydronephrosis. There is no perinephric inflammation. Symmetric excretion on delayed phase imaging. Unremarkable urinary bladder. Stomach/Bowel: Lack of enteric contrast limits detailed bowel assessment. Nondistended stomach. No small bowel dilatation or evidence of obstruction. Enteric sutures in the sigmoid. Small to moderate volume of stool in colon. There is no evidence of colonic obstruction or inflammation. Vascular/Lymphatic: Aortic atherosclerosis. No aneurysm. Again seen multifocal adenopathy, progressed. Representative portal caval node measures 4.5 x 2.6 cm, series 4, image 35, previously 4.2 x 2.2 retrocaval nodal conglomerate measures 4.2 x 2.2 cm series 4, image 41, previously 3.5 x 1.8 cm. Cm. Reproductive: Prominent prostate again seen. Other: Significant progression in peritoneal nodularity an wispy soft tissue density in the anterior omentum. There previous 10 mm soft tissue nodule is now obscured by diffuse omental soft tissue density in thickening. Small to moderate volume ascites in the abdomen and pelvis is new. Musculoskeletal: Posterior lumbar fusion hardware. No evidence of focal bone lesion or acute osseous findings. IMPRESSION: 1. No bowel obstruction. 2. Known metastatic disease with progression. This includes development of diffuse omental caking and new small to moderate abdominopelvic ascites over the last 17 days. Progression in known hepatic and nodal metastatic disease. 3. Gallstones and nonobstructive renal calculi. Aortic Atherosclerosis (ICD10-I70.0). Electronically Signed   By: Narda Rutherford M.D.   On: 03/03/2023 19:15   CT Angio Chest PE W and/or Wo Contrast  Result Date: 03/03/2023 CLINICAL DATA:  Pulmonary embolism (PE) suspected, high prob Shortness  of breath. Review of radiologic  records indicates history of metastatic colon cancer. EXAM: CT ANGIOGRAPHY CHEST WITH CONTRAST TECHNIQUE: Multidetector CT imaging of the chest was performed using the standard protocol during bolus administration of intravenous contrast. Multiplanar CT image reconstructions and MIPs were obtained to evaluate the vascular anatomy. RADIATION DOSE REDUCTION: This exam was performed according to the departmental dose-optimization program which includes automated exposure control, adjustment of the mA and/or kV according to patient size and/or use of iterative reconstruction technique. CONTRAST:  75mL OMNIPAQUE IOHEXOL 350 MG/ML SOLN COMPARISON:  Radiograph earlier today.  Chest CT 02/14/2023 FINDINGS: Cardiovascular: There are no filling defects within the pulmonary arteries to suggest pulmonary embolus. Mediastinal adenopathy causes mass effect on the right middle lobar pulmonary artery but no filling defects. Borderline cardiomegaly. Small pericardial effusion. No acute aortic findings. Right chest port in place. Mediastinum/Nodes: Multifocal mediastinal and hilar adenopathy, with progression from prior exam. For example, subcarinal node measures 2.5 cm short axis series 2, image 62, previously 2.2 cm. Right hilar node measures 1.8 cm short axis series 2, image 59, previously 1.6 cm. Right anterior pericardial node measures 4.1 x 2.5 cm, series 2, image 93, previously 4 x 2.5 cm. There is enlargement of multiple additional mediastinal and hilar nodes. No esophageal wall thickening. Lungs/Pleura: Pulmonary metastatic disease again seen, with progression from prior. Many of the previous nodules and masses have now coalesced making exact measurement difficult. This includes a right middle lobe masslike conglomerate measuring 4.1 x 5.8 cm, series 10, image 66, previously 4 x 5.1 cm on my remeasurement. Left lower lobe mass measures 3.4 x 2.9 cm, series 10, image 64, previously 3.4 x 2 6  cm. Many of the smaller nodules have also increased in size. There is a trace right pleural effusion, new. Upper Abdomen: Assessed on concurrent abdominopelvic CT, reported separately. Musculoskeletal: No acute findings or evidence of osseous metastatic disease. Review of the MIP images confirms the above findings. IMPRESSION: 1. No pulmonary embolus. 2. Known intrathoracic metastatic disease with progression over the last 17 days. Enlargement of thoracic lymph nodes and pulmonary metastasis. 3. Trace right pleural effusion, new. Small pericardial effusion. Electronically Signed   By: Narda Rutherford M.D.   On: 03/03/2023 19:07   DG Chest 2 View  Result Date: 03/03/2023 CLINICAL DATA:  Shortness of breath. EXAM: CHEST - 2 VIEW COMPARISON:  06/08/2022. FINDINGS: There are at least 3 lung masses with largest in the middle lobe measuring up to 2.8 x 4.8 cm, compatible with metastasis. There is interval increase in the size when compared to the prior radiograph from 06/08/2022. Bilateral lung fields are otherwise clear. No acute consolidation or lung collapse. Bilateral costophrenic angles are clear. Stable cardio-mediastinal silhouette. No acute osseous abnormalities. The soft tissues are within normal limits. Redemonstration of right-sided CT Port-A-Cath with its tip overlying the lower portion of superior vena cava, unchanged. IMPRESSION: 1. Interval increase in size of pulmonary metastasis. 2. No acute cardiopulmonary abnormality. Electronically Signed   By: Jules Schick M.D.   On: 03/03/2023 16:31   CT CHEST ABDOMEN PELVIS W CONTRAST  Result Date: 02/21/2023 CLINICAL DATA:  Restaging metastatic colon cancer. * Tracking Code: BO * EXAM: CT CHEST, ABDOMEN, AND PELVIS WITH CONTRAST TECHNIQUE: Multidetector CT imaging of the chest, abdomen and pelvis was performed following the standard protocol during bolus administration of intravenous contrast. RADIATION DOSE REDUCTION: This exam was performed according  to the departmental dose-optimization program which includes automated exposure control, adjustment of the mA and/or kV according to patient size  and/or use of iterative reconstruction technique. CONTRAST:  OMNIPAQUE IOHEXOL 300 MG/ML  SOLN COMPARISON:  10/22/2022 FINDINGS: CT CHEST FINDINGS Cardiovascular: Normal heart size. No pericardial effusion. Aortic atherosclerosis. Mediastinum/Nodes: Thyroid gland, trachea and esophagus are unremarkable. -Subcarinal lymph node measures 2.2 cm, image 30/2. Previously 1.2 cm. -Right hilar measures 1.6 cm, image 30/2.  Previously 1.3 cm. -Right CP angle lymph node measures 2.5 x 4.0 cm, image 46/2. Formally 2.2 x 3.5 cm. -New lymph node within the left CP angle measures 1.9 x 1.1 cm, image 51/2. Lungs/Pleura: No pleural effusion. Multifocal bilateral pulmonary metastases are again identified. -Within the left midlung there is a perihilar mass which measures 5.1 x 3.2 cm, image 82/4. Formally 4.4 by 3.3 cm. -Left lower lobe lung lesion measures 3.4 x 2.6 cm, image 80/4. Formally 3.1 x 2.3 cm. -Right middle lobe lung mass measures 4.0 x 3.1 cm, image 84/4. Previously this measured 3.6 x 2.8 cm. Musculoskeletal: No suspicious bone lesions. CT ABDOMEN PELVIS FINDINGS Hepatobiliary: Multifocal liver metastases are again identified. -Confluent mass within the lateral segment of left lobe measures 7.8 x 6.6 cm, image 60/2. Formally 6.6 x 6.1 cm. -Lesion within the periphery of the right lobe measures 2.5 x 2.1 cm, image 53/2. Previously 2.0 x 1.9 cm. -Index lesion within the inferior right lobe measures 2.4 x 1.6 cm, image 68/2. Previously 2.4 by 1.5 cm. Small stones identified within the partially decompressed bladder. No bile duct dilatation Pancreas: Unremarkable. No pancreatic ductal dilatation or surrounding inflammatory changes. Spleen: Normal in size without focal abnormality. Adrenals/Urinary Tract: Normal adrenal glands. Nonobstructing stone within the left kidney  measures 5 mm. No hydronephrosis or suspicious mass. Bladder is unremarkable. Stomach/Bowel: Stomach appears nondistended. The appendix is visualized and is within normal limits. No pathologic dilatation of the large or small bowel loops. Postoperative changes from sigmoid resection. Vascular/Lymphatic: Aortic atherosclerosis without aneurysm. -Portacaval lymph node measures 4.2 x 2.2 cm, image 65/2. Previously 3.1 x 1.8 cm. -New retrocaval nodal conglomeration measures 3.5 x 1.8 cm, image 68/2. -Left periaortic lymph node measures 1.2 cm short axis, image 82/2. Formally 0.6 cm. Reproductive: Prostate gland enlargement. Other: New peritoneal soft tissue nodule within the ventral abdomen measures 1 cm, image 76/2. Also new is a small peritoneal nodule in the upper abdomen measuring 0.8 cm, image 71/2. No significant ascites or focal fluid collections. Musculoskeletal: No suspicious bone lesions. Postoperative change within the lumbar spine. IMPRESSION: 1. Interval progression of disease with increased size of bilateral pulmonary metastases, mediastinal and hilar adenopathy, and liver metastases. 2. Increased size of portacaval and retrocaval adenopathy. 3. New peritoneal soft tissue nodules within the ventral abdomen and upper abdomen suggests of early peritoneal carcinomatosis. 4. Nonobstructing left renal calculus. 5. Prostate gland enlargement. 6.  Aortic Atherosclerosis (ICD10-I70.0). Electronically Signed   By: Signa Kell M.D.   On: 02/21/2023 07:58     Time coordinating discharge: Over 30 minutes    Lewie Chamber, MD  [facility] 03/09/2023, 3:10 PM

## 2023-03-10 ENCOUNTER — Other Ambulatory Visit: Payer: Self-pay

## 2023-03-10 ENCOUNTER — Ambulatory Visit (HOSPITAL_COMMUNITY)
Admission: RE | Admit: 2023-03-10 | Discharge: 2023-03-10 | Disposition: A | Discharge status: Home / Self Care | Payer: Medicaid Other | Source: Ambulatory Visit | Attending: Oncology | Admitting: Oncology

## 2023-03-10 ENCOUNTER — Telehealth: Payer: Self-pay

## 2023-03-10 DIAGNOSIS — C787 Secondary malignant neoplasm of liver and intrahepatic bile duct: Secondary | ICD-10-CM

## 2023-03-10 DIAGNOSIS — C189 Malignant neoplasm of colon, unspecified: Secondary | ICD-10-CM | POA: Diagnosis present

## 2023-03-10 HISTORY — PX: IR PARACENTESIS: IMG2679

## 2023-03-10 LAB — BODY FLUID CULTURE W GRAM STAIN
Culture: NO GROWTH
Gram Stain: NONE SEEN

## 2023-03-10 MED ORDER — LIDOCAINE HCL 1 % IJ SOLN
20.0000 mL | Freq: Once | INTRAMUSCULAR | Status: AC
  Administered 2023-03-10: 10 mL

## 2023-03-10 MED ORDER — LIDOCAINE HCL 1 % IJ SOLN
INTRAMUSCULAR | Status: AC
Start: 2023-03-10 — End: ?
  Filled 2023-03-10: qty 20

## 2023-03-10 NOTE — Procedures (Signed)
Ultrasound-guided therapeutic paracentesis performed yielding 4.2 liters of serosanguinous colored fluid. . No immediate complications. EBL is none.

## 2023-03-10 NOTE — Telephone Encounter (Signed)
The patient's wife reported that the patient requires a paracentesis due to shortness of breath and abdominal distension. It is noted that the patient underwent a ultrasound-guided paracentesis the previous Tuesday.

## 2023-03-13 LAB — CYTOLOGY - NON PAP

## 2023-03-14 ENCOUNTER — Other Ambulatory Visit: Payer: Self-pay

## 2023-03-14 ENCOUNTER — Inpatient Hospital Stay: Payer: Medicaid Other

## 2023-03-14 ENCOUNTER — Emergency Department (HOSPITAL_BASED_OUTPATIENT_CLINIC_OR_DEPARTMENT_OTHER): Payer: Medicaid Other

## 2023-03-14 ENCOUNTER — Inpatient Hospital Stay (HOSPITAL_BASED_OUTPATIENT_CLINIC_OR_DEPARTMENT_OTHER)
Admission: EM | Admit: 2023-03-14 | Discharge: 2023-03-17 | DRG: 374 | Disposition: A | Discharge status: Hospice | Payer: Medicaid Other | Source: Ambulatory Visit | Attending: Internal Medicine | Admitting: Internal Medicine

## 2023-03-14 ENCOUNTER — Inpatient Hospital Stay: Payer: Medicaid Other | Admitting: Nurse Practitioner

## 2023-03-14 ENCOUNTER — Encounter (HOSPITAL_BASED_OUTPATIENT_CLINIC_OR_DEPARTMENT_OTHER): Payer: Self-pay | Admitting: Emergency Medicine

## 2023-03-14 ENCOUNTER — Telehealth: Payer: Self-pay

## 2023-03-14 VITALS — BP 96/74 | HR 64 | Temp 97.1°F | Resp 18 | Wt 203.6 lb

## 2023-03-14 DIAGNOSIS — Z85048 Personal history of other malignant neoplasm of rectum, rectosigmoid junction, and anus: Secondary | ICD-10-CM

## 2023-03-14 DIAGNOSIS — C7801 Secondary malignant neoplasm of right lung: Secondary | ICD-10-CM | POA: Diagnosis present

## 2023-03-14 DIAGNOSIS — A4189 Other specified sepsis: Secondary | ICD-10-CM | POA: Diagnosis present

## 2023-03-14 DIAGNOSIS — Z7984 Long term (current) use of oral hypoglycemic drugs: Secondary | ICD-10-CM

## 2023-03-14 DIAGNOSIS — R188 Other ascites: Secondary | ICD-10-CM | POA: Diagnosis present

## 2023-03-14 DIAGNOSIS — C7989 Secondary malignant neoplasm of other specified sites: Secondary | ICD-10-CM | POA: Diagnosis present

## 2023-03-14 DIAGNOSIS — G8929 Other chronic pain: Secondary | ICD-10-CM | POA: Diagnosis present

## 2023-03-14 DIAGNOSIS — Z8505 Personal history of malignant neoplasm of liver: Secondary | ICD-10-CM

## 2023-03-14 DIAGNOSIS — G9341 Metabolic encephalopathy: Secondary | ICD-10-CM | POA: Diagnosis present

## 2023-03-14 DIAGNOSIS — Z79890 Hormone replacement therapy (postmenopausal): Secondary | ICD-10-CM

## 2023-03-14 DIAGNOSIS — G40909 Epilepsy, unspecified, not intractable, without status epilepticus: Secondary | ICD-10-CM | POA: Diagnosis present

## 2023-03-14 DIAGNOSIS — I959 Hypotension, unspecified: Secondary | ICD-10-CM | POA: Diagnosis present

## 2023-03-14 DIAGNOSIS — E860 Dehydration: Secondary | ICD-10-CM

## 2023-03-14 DIAGNOSIS — Z87442 Personal history of urinary calculi: Secondary | ICD-10-CM

## 2023-03-14 DIAGNOSIS — E278 Other specified disorders of adrenal gland: Secondary | ICD-10-CM | POA: Diagnosis present

## 2023-03-14 DIAGNOSIS — E875 Hyperkalemia: Secondary | ICD-10-CM | POA: Diagnosis present

## 2023-03-14 DIAGNOSIS — C189 Malignant neoplasm of colon, unspecified: Secondary | ICD-10-CM | POA: Diagnosis not present

## 2023-03-14 DIAGNOSIS — E039 Hypothyroidism, unspecified: Secondary | ICD-10-CM | POA: Diagnosis present

## 2023-03-14 DIAGNOSIS — Z515 Encounter for palliative care: Secondary | ICD-10-CM

## 2023-03-14 DIAGNOSIS — C7800 Secondary malignant neoplasm of unspecified lung: Secondary | ICD-10-CM | POA: Diagnosis not present

## 2023-03-14 DIAGNOSIS — N1831 Chronic kidney disease, stage 3a: Secondary | ICD-10-CM | POA: Diagnosis present

## 2023-03-14 DIAGNOSIS — D6959 Other secondary thrombocytopenia: Secondary | ICD-10-CM | POA: Diagnosis present

## 2023-03-14 DIAGNOSIS — Z801 Family history of malignant neoplasm of trachea, bronchus and lung: Secondary | ICD-10-CM

## 2023-03-14 DIAGNOSIS — R6510 Systemic inflammatory response syndrome (SIRS) of non-infectious origin without acute organ dysfunction: Secondary | ICD-10-CM | POA: Diagnosis not present

## 2023-03-14 DIAGNOSIS — R180 Malignant ascites: Secondary | ICD-10-CM | POA: Diagnosis not present

## 2023-03-14 DIAGNOSIS — F32A Depression, unspecified: Secondary | ICD-10-CM | POA: Diagnosis present

## 2023-03-14 DIAGNOSIS — N179 Acute kidney failure, unspecified: Principal | ICD-10-CM | POA: Insufficient documentation

## 2023-03-14 DIAGNOSIS — E871 Hypo-osmolality and hyponatremia: Secondary | ICD-10-CM | POA: Diagnosis present

## 2023-03-14 DIAGNOSIS — Z6830 Body mass index (BMI) 30.0-30.9, adult: Secondary | ICD-10-CM | POA: Diagnosis not present

## 2023-03-14 DIAGNOSIS — K7682 Hepatic encephalopathy: Secondary | ICD-10-CM | POA: Diagnosis present

## 2023-03-14 DIAGNOSIS — C786 Secondary malignant neoplasm of retroperitoneum and peritoneum: Secondary | ICD-10-CM | POA: Diagnosis not present

## 2023-03-14 DIAGNOSIS — E785 Hyperlipidemia, unspecified: Secondary | ICD-10-CM | POA: Diagnosis not present

## 2023-03-14 DIAGNOSIS — C787 Secondary malignant neoplasm of liver and intrahepatic bile duct: Secondary | ICD-10-CM

## 2023-03-14 DIAGNOSIS — Z833 Family history of diabetes mellitus: Secondary | ICD-10-CM

## 2023-03-14 DIAGNOSIS — Z9981 Dependence on supplemental oxygen: Secondary | ICD-10-CM

## 2023-03-14 DIAGNOSIS — I252 Old myocardial infarction: Secondary | ICD-10-CM

## 2023-03-14 DIAGNOSIS — Z79899 Other long term (current) drug therapy: Secondary | ICD-10-CM

## 2023-03-14 DIAGNOSIS — N189 Chronic kidney disease, unspecified: Secondary | ICD-10-CM | POA: Diagnosis not present

## 2023-03-14 DIAGNOSIS — A419 Sepsis, unspecified organism: Secondary | ICD-10-CM | POA: Diagnosis not present

## 2023-03-14 DIAGNOSIS — J9611 Chronic respiratory failure with hypoxia: Secondary | ICD-10-CM | POA: Diagnosis present

## 2023-03-14 DIAGNOSIS — D631 Anemia in chronic kidney disease: Secondary | ICD-10-CM | POA: Diagnosis present

## 2023-03-14 DIAGNOSIS — C187 Malignant neoplasm of sigmoid colon: Principal | ICD-10-CM | POA: Diagnosis present

## 2023-03-14 DIAGNOSIS — I129 Hypertensive chronic kidney disease with stage 1 through stage 4 chronic kidney disease, or unspecified chronic kidney disease: Secondary | ICD-10-CM | POA: Diagnosis present

## 2023-03-14 DIAGNOSIS — T451X5A Adverse effect of antineoplastic and immunosuppressive drugs, initial encounter: Secondary | ICD-10-CM | POA: Diagnosis present

## 2023-03-14 DIAGNOSIS — G40309 Generalized idiopathic epilepsy and epileptic syndromes, not intractable, without status epilepticus: Secondary | ICD-10-CM | POA: Diagnosis present

## 2023-03-14 DIAGNOSIS — K219 Gastro-esophageal reflux disease without esophagitis: Secondary | ICD-10-CM | POA: Diagnosis present

## 2023-03-14 DIAGNOSIS — E1151 Type 2 diabetes mellitus with diabetic peripheral angiopathy without gangrene: Secondary | ICD-10-CM | POA: Diagnosis present

## 2023-03-14 DIAGNOSIS — Z66 Do not resuscitate: Secondary | ICD-10-CM | POA: Diagnosis present

## 2023-03-14 DIAGNOSIS — Z8673 Personal history of transient ischemic attack (TIA), and cerebral infarction without residual deficits: Secondary | ICD-10-CM

## 2023-03-14 DIAGNOSIS — Z8616 Personal history of COVID-19: Secondary | ICD-10-CM

## 2023-03-14 DIAGNOSIS — Z8349 Family history of other endocrine, nutritional and metabolic diseases: Secondary | ICD-10-CM

## 2023-03-14 DIAGNOSIS — D630 Anemia in neoplastic disease: Secondary | ICD-10-CM | POA: Diagnosis present

## 2023-03-14 DIAGNOSIS — R011 Cardiac murmur, unspecified: Secondary | ICD-10-CM | POA: Diagnosis not present

## 2023-03-14 DIAGNOSIS — R140 Abdominal distension (gaseous): Secondary | ICD-10-CM | POA: Diagnosis not present

## 2023-03-14 DIAGNOSIS — R4182 Altered mental status, unspecified: Secondary | ICD-10-CM | POA: Diagnosis not present

## 2023-03-14 DIAGNOSIS — R627 Adult failure to thrive: Secondary | ICD-10-CM | POA: Diagnosis present

## 2023-03-14 DIAGNOSIS — R590 Localized enlarged lymph nodes: Secondary | ICD-10-CM | POA: Diagnosis present

## 2023-03-14 DIAGNOSIS — Z7901 Long term (current) use of anticoagulants: Secondary | ICD-10-CM

## 2023-03-14 DIAGNOSIS — C7802 Secondary malignant neoplasm of left lung: Secondary | ICD-10-CM | POA: Diagnosis present

## 2023-03-14 DIAGNOSIS — I950 Idiopathic hypotension: Secondary | ICD-10-CM | POA: Diagnosis not present

## 2023-03-14 DIAGNOSIS — Z560 Unemployment, unspecified: Secondary | ICD-10-CM

## 2023-03-14 DIAGNOSIS — K625 Hemorrhage of anus and rectum: Secondary | ICD-10-CM | POA: Diagnosis not present

## 2023-03-14 DIAGNOSIS — E872 Acidosis, unspecified: Secondary | ICD-10-CM | POA: Diagnosis present

## 2023-03-14 DIAGNOSIS — F419 Anxiety disorder, unspecified: Secondary | ICD-10-CM | POA: Diagnosis present

## 2023-03-14 DIAGNOSIS — R339 Retention of urine, unspecified: Secondary | ICD-10-CM | POA: Diagnosis present

## 2023-03-14 DIAGNOSIS — E66811 Obesity, class 1: Secondary | ICD-10-CM | POA: Diagnosis present

## 2023-03-14 DIAGNOSIS — E1122 Type 2 diabetes mellitus with diabetic chronic kidney disease: Secondary | ICD-10-CM | POA: Diagnosis present

## 2023-03-14 DIAGNOSIS — R000 Tachycardia, unspecified: Secondary | ICD-10-CM | POA: Diagnosis not present

## 2023-03-14 DIAGNOSIS — D6481 Anemia due to antineoplastic chemotherapy: Secondary | ICD-10-CM | POA: Diagnosis not present

## 2023-03-14 DIAGNOSIS — Z885 Allergy status to narcotic agent status: Secondary | ICD-10-CM

## 2023-03-14 DIAGNOSIS — R7989 Other specified abnormal findings of blood chemistry: Secondary | ICD-10-CM | POA: Diagnosis present

## 2023-03-14 DIAGNOSIS — Z888 Allergy status to other drugs, medicaments and biological substances status: Secondary | ICD-10-CM

## 2023-03-14 DIAGNOSIS — Z794 Long term (current) use of insulin: Secondary | ICD-10-CM

## 2023-03-14 DIAGNOSIS — K5900 Constipation, unspecified: Secondary | ICD-10-CM | POA: Diagnosis not present

## 2023-03-14 DIAGNOSIS — I10 Essential (primary) hypertension: Secondary | ICD-10-CM | POA: Insufficient documentation

## 2023-03-14 DIAGNOSIS — Z8249 Family history of ischemic heart disease and other diseases of the circulatory system: Secondary | ICD-10-CM

## 2023-03-14 DIAGNOSIS — M549 Dorsalgia, unspecified: Secondary | ICD-10-CM | POA: Diagnosis present

## 2023-03-14 DIAGNOSIS — Z9049 Acquired absence of other specified parts of digestive tract: Secondary | ICD-10-CM

## 2023-03-14 LAB — CBC WITH DIFFERENTIAL (CANCER CENTER ONLY)
Abs Immature Granulocytes: 0.35 10*3/uL — ABNORMAL HIGH (ref 0.00–0.07)
Basophils Absolute: 0 10*3/uL (ref 0.0–0.1)
Basophils Relative: 0 %
Eosinophils Absolute: 0 10*3/uL (ref 0.0–0.5)
Eosinophils Relative: 0 %
HCT: 28.5 % — ABNORMAL LOW (ref 39.0–52.0)
Hemoglobin: 8.9 g/dL — ABNORMAL LOW (ref 13.0–17.0)
Immature Granulocytes: 2 %
Lymphocytes Relative: 10 %
Lymphs Abs: 1.8 10*3/uL (ref 0.7–4.0)
MCH: 27.3 pg (ref 26.0–34.0)
MCHC: 31.2 g/dL (ref 30.0–36.0)
MCV: 87.4 fL (ref 80.0–100.0)
Monocytes Absolute: 1.4 10*3/uL — ABNORMAL HIGH (ref 0.1–1.0)
Monocytes Relative: 7 %
Neutro Abs: 15.4 10*3/uL — ABNORMAL HIGH (ref 1.7–7.7)
Neutrophils Relative %: 81 %
Platelet Count: 168 10*3/uL (ref 150–400)
RBC: 3.26 MIL/uL — ABNORMAL LOW (ref 4.22–5.81)
RDW: 18.6 % — ABNORMAL HIGH (ref 11.5–15.5)
WBC Count: 19 10*3/uL — ABNORMAL HIGH (ref 4.0–10.5)
nRBC: 0.1 % (ref 0.0–0.2)

## 2023-03-14 LAB — CMP (CANCER CENTER ONLY)
ALT: 25 U/L (ref 0–44)
AST: 109 U/L — ABNORMAL HIGH (ref 15–41)
Albumin: 2.8 g/dL — ABNORMAL LOW (ref 3.5–5.0)
Alkaline Phosphatase: 345 U/L — ABNORMAL HIGH (ref 38–126)
Anion gap: 10 (ref 5–15)
BUN: 56 mg/dL — ABNORMAL HIGH (ref 6–20)
CO2: 16 mmol/L — ABNORMAL LOW (ref 22–32)
Calcium: 8.4 mg/dL — ABNORMAL LOW (ref 8.9–10.3)
Chloride: 103 mmol/L (ref 98–111)
Creatinine: 2.8 mg/dL — ABNORMAL HIGH (ref 0.61–1.24)
GFR, Estimated: 26 mL/min — ABNORMAL LOW (ref >60)
Glucose, Bld: 212 mg/dL — ABNORMAL HIGH (ref 70–99)
Potassium: 5.6 mmol/L — ABNORMAL HIGH (ref 3.5–5.1)
Sodium: 129 mmol/L — ABNORMAL LOW (ref 135–145)
Total Bilirubin: 0.5 mg/dL (ref 0.3–1.2)
Total Protein: 6.6 g/dL (ref 6.5–8.1)

## 2023-03-14 LAB — URINALYSIS, ROUTINE W REFLEX MICROSCOPIC
Bacteria, UA: NONE SEEN
Bilirubin Urine: NEGATIVE
Glucose, UA: >1000 mg/dL — AB
Hgb urine dipstick: NEGATIVE
Ketones, ur: NEGATIVE mg/dL
Leukocytes,Ua: NEGATIVE
Nitrite: NEGATIVE
Specific Gravity, Urine: 1.022 (ref 1.005–1.030)
pH: 5.5 (ref 5.0–8.0)

## 2023-03-14 LAB — LACTIC ACID, PLASMA
Lactic Acid, Venous: 5.4 mmol/L (ref 0.5–1.9)
Lactic Acid, Venous: 4.8 mmol/L (ref 0.5–1.9)
Lactic Acid, Venous: 4.1 mmol/L (ref 0.5–1.9)
Lactic Acid, Venous: 3.1 mmol/L (ref 0.5–1.9)
Lactic Acid, Venous: 2.1 mmol/L (ref 0.5–1.9)

## 2023-03-14 LAB — GLUCOSE, CAPILLARY
Glucose-Capillary: 155 mg/dL — ABNORMAL HIGH (ref 70–99)
Glucose-Capillary: 146 mg/dL — ABNORMAL HIGH (ref 70–99)

## 2023-03-14 LAB — POCT I-STAT EG7
Acid-base deficit: 17 mmol/L — ABNORMAL HIGH (ref 0.0–2.0)
Bicarbonate: 10 mmol/L — ABNORMAL LOW (ref 20.0–28.0)
Calcium, Ion: 1.07 mmol/L — ABNORMAL LOW (ref 1.15–1.40)
HCT: 27 % — ABNORMAL LOW (ref 39.0–52.0)
Hemoglobin: 9.2 g/dL — ABNORMAL LOW (ref 13.0–17.0)
O2 Saturation: 57 %
Potassium: 5.5 mmol/L — ABNORMAL HIGH (ref 3.5–5.1)
Sodium: 133 mmol/L — ABNORMAL LOW (ref 135–145)
TCO2: 11 mmol/L — ABNORMAL LOW (ref 22–32)
pCO2, Ven: 26 mm[Hg] — ABNORMAL LOW (ref 44–60)
pH, Ven: 7.195 — CL (ref 7.25–7.43)
pO2, Ven: 36 mm[Hg] (ref 32–45)

## 2023-03-14 LAB — D-DIMER, QUANTITATIVE: D-Dimer, Quant: >20 ug{FEU}/mL — ABNORMAL HIGH (ref 0.00–0.50)

## 2023-03-14 LAB — BRAIN NATRIURETIC PEPTIDE: B Natriuretic Peptide: 41.5 pg/mL (ref 0.0–100.0)

## 2023-03-14 LAB — AMMONIA: Ammonia: 26 umol/L (ref 9–35)

## 2023-03-14 LAB — TROPONIN I (HIGH SENSITIVITY)
Troponin I (High Sensitivity): 10 ng/L (ref <18)
Troponin I (High Sensitivity): 8 ng/L (ref <18)

## 2023-03-14 LAB — HEMOGLOBIN A1C
Hgb A1c MFr Bld: 7.5 % — ABNORMAL HIGH (ref 4.8–5.6)
Mean Plasma Glucose: 168.55 mg/dL

## 2023-03-14 MED ORDER — OXYCODONE HCL 5 MG PO TABS
5.0000 mg | ORAL_TABLET | Freq: Four times a day (QID) | ORAL | Status: DC | PRN
  Administered 2023-03-14: 5 mg via ORAL
  Filled 2023-03-14: qty 1

## 2023-03-14 MED ORDER — POLYETHYLENE GLYCOL 3350 17 G PO PACK
17.0000 g | PACK | Freq: Two times a day (BID) | ORAL | Status: DC
Start: 2023-03-14 — End: 2023-03-17
  Administered 2023-03-14 – 2023-03-17 (×6): 17 g via ORAL
  Filled 2023-03-14 (×6): qty 1

## 2023-03-14 MED ORDER — ONDANSETRON HCL 4 MG/2ML IJ SOLN
4.0000 mg | Freq: Four times a day (QID) | INTRAMUSCULAR | Status: DC | PRN
  Administered 2023-03-17: 4 mg via INTRAVENOUS
  Filled 2023-03-14: qty 2

## 2023-03-14 MED ORDER — OXYCODONE-ACETAMINOPHEN 10-325 MG PO TABS: 1.0000 | ORAL_TABLET | Freq: Four times a day (QID) | ORAL | Status: DC | PRN

## 2023-03-14 MED ORDER — SODIUM CHLORIDE 0.9 % IV SOLN: INTRAVENOUS | Status: AC | PRN

## 2023-03-14 MED ORDER — SODIUM CHLORIDE 0.9 % IV SOLN: INTRAVENOUS | Status: DC

## 2023-03-14 MED ORDER — INSULIN ASPART 100 UNIT/ML IJ SOLN: 0.0000 [IU] | Freq: Every day | INTRAMUSCULAR | Status: DC

## 2023-03-14 MED ORDER — MIDODRINE HCL 5 MG PO TABS
10.0000 mg | ORAL_TABLET | Freq: Three times a day (TID) | ORAL | Status: DC
  Administered 2023-03-14 – 2023-03-17 (×8): 10 mg via ORAL
  Filled 2023-03-14 (×8): qty 2

## 2023-03-14 MED ORDER — POTASSIUM CHLORIDE CRYS ER 20 MEQ PO TBCR: 20.0000 meq | EXTENDED_RELEASE_TABLET | Freq: Every day | ORAL | Status: DC

## 2023-03-14 MED ORDER — VANCOMYCIN HCL IN DEXTROSE 1-5 GM/200ML-% IV SOLN: 1000.0000 mg | Freq: Once | INTRAVENOUS | Status: DC

## 2023-03-14 MED ORDER — DAPAGLIFLOZIN PROPANEDIOL 10 MG PO TABS: 10.0000 mg | ORAL_TABLET | Freq: Every day | ORAL | Status: DC

## 2023-03-14 MED ORDER — SODIUM CHLORIDE 0.9 % IV BOLUS
1000.0000 mL | Freq: Once | INTRAVENOUS | Status: AC
  Administered 2023-03-14: 1000 mL via INTRAVENOUS

## 2023-03-14 MED ORDER — VANCOMYCIN HCL IN DEXTROSE 1-5 GM/200ML-% IV SOLN
1000.0000 mg | Freq: Once | INTRAVENOUS | Status: AC
  Administered 2023-03-14: 1000 mg via INTRAVENOUS
  Filled 2023-03-14: qty 200

## 2023-03-14 MED ORDER — MIRTAZAPINE 15 MG PO TABS
15.0000 mg | ORAL_TABLET | Freq: Every day | ORAL | Status: DC
  Administered 2023-03-14 – 2023-03-16 (×3): 15 mg via ORAL
  Filled 2023-03-14 (×3): qty 1

## 2023-03-14 MED ORDER — SODIUM CHLORIDE 0.9 % IV BOLUS
2000.0000 mL | Freq: Once | INTRAVENOUS | Status: AC
Start: 2023-03-14 — End: 2023-03-14
  Administered 2023-03-14: 2000 mL via INTRAVENOUS

## 2023-03-14 MED ORDER — INSULIN DEGLUDEC 200 UNIT/ML ~~LOC~~ SOPN: PEN_INJECTOR | Freq: Every day | SUBCUTANEOUS | Status: DC

## 2023-03-14 MED ORDER — ORAL CARE MOUTH RINSE: 15.0000 mL | OROMUCOSAL | Status: DC | PRN

## 2023-03-14 MED ORDER — BENZONATATE 100 MG PO CAPS: 100.0000 mg | ORAL_CAPSULE | Freq: Two times a day (BID) | ORAL | Status: DC | PRN

## 2023-03-14 MED ORDER — MAGNESIUM HYDROXIDE 400 MG/5ML PO SUSP
30.0000 mL | Freq: Every day | ORAL | Status: DC | PRN
Start: 2023-03-14 — End: 2023-03-17

## 2023-03-14 MED ORDER — METOPROLOL TARTRATE 25 MG PO TABS
100.0000 mg | ORAL_TABLET | Freq: Two times a day (BID) | ORAL | Status: DC
  Administered 2023-03-14: 100 mg via ORAL
  Filled 2023-03-14: qty 4

## 2023-03-14 MED ORDER — SODIUM CHLORIDE 0.9 % IV SOLN: 2.0000 g | Freq: Once | INTRAVENOUS | Status: DC

## 2023-03-14 MED ORDER — VENLAFAXINE HCL ER 75 MG PO CP24
225.0000 mg | ORAL_CAPSULE | Freq: Every day | ORAL | Status: DC
  Administered 2023-03-14 – 2023-03-17 (×4): 225 mg via ORAL
  Filled 2023-03-14 (×4): qty 1

## 2023-03-14 MED ORDER — LEVOTHYROXINE SODIUM 25 MCG PO TABS
137.0000 ug | ORAL_TABLET | Freq: Every day | ORAL | Status: DC
  Administered 2023-03-15 – 2023-03-17 (×3): 137 ug via ORAL
  Filled 2023-03-14 (×3): qty 1

## 2023-03-14 MED ORDER — OXYCODONE HCL ER 10 MG PO T12A
10.0000 mg | EXTENDED_RELEASE_TABLET | Freq: Two times a day (BID) | ORAL | Status: DC
Start: 2023-03-14 — End: 2023-03-17
  Administered 2023-03-14 – 2023-03-17 (×5): 10 mg via ORAL
  Filled 2023-03-14 (×5): qty 1

## 2023-03-14 MED ORDER — PANTOPRAZOLE SODIUM 40 MG PO TBEC
40.0000 mg | DELAYED_RELEASE_TABLET | Freq: Two times a day (BID) | ORAL | Status: DC
  Administered 2023-03-14 – 2023-03-17 (×6): 40 mg via ORAL
  Filled 2023-03-14 (×6): qty 1

## 2023-03-14 MED ORDER — ONDANSETRON HCL 4 MG PO TABS: 4.0000 mg | ORAL_TABLET | Freq: Four times a day (QID) | ORAL | Status: DC | PRN

## 2023-03-14 MED ORDER — LEVETIRACETAM 500 MG PO TABS
750.0000 mg | ORAL_TABLET | Freq: Two times a day (BID) | ORAL | Status: DC
  Administered 2023-03-14 – 2023-03-17 (×6): 750 mg via ORAL
  Filled 2023-03-14 (×7): qty 1

## 2023-03-14 MED ORDER — NITROGLYCERIN 0.4 MG SL SUBL: 0.4000 mg | SUBLINGUAL_TABLET | SUBLINGUAL | Status: DC | PRN

## 2023-03-14 MED ORDER — ACETAMINOPHEN 650 MG RE SUPP: 650.0000 mg | Freq: Four times a day (QID) | RECTAL | Status: DC | PRN

## 2023-03-14 MED ORDER — LORAZEPAM 0.5 MG PO TABS
0.5000 mg | ORAL_TABLET | Freq: Three times a day (TID) | ORAL | Status: DC | PRN
  Administered 2023-03-14: 0.5 mg via ORAL
  Filled 2023-03-14: qty 1

## 2023-03-14 MED ORDER — METRONIDAZOLE 500 MG/100ML IV SOLN
500.0000 mg | Freq: Two times a day (BID) | INTRAVENOUS | Status: DC
Start: 2023-03-14 — End: 2023-03-17
  Administered 2023-03-14 – 2023-03-17 (×6): 500 mg via INTRAVENOUS
  Filled 2023-03-14 (×7): qty 100

## 2023-03-14 MED ORDER — ACETAMINOPHEN 325 MG PO TABS: 650.0000 mg | ORAL_TABLET | Freq: Four times a day (QID) | ORAL | Status: DC | PRN

## 2023-03-14 MED ORDER — INSULIN GLARGINE-YFGN 100 UNIT/ML ~~LOC~~ SOLN
15.0000 [IU] | Freq: Every day | SUBCUTANEOUS | Status: DC
  Administered 2023-03-14 – 2023-03-15 (×2): 15 [IU] via SUBCUTANEOUS
  Filled 2023-03-14 (×3): qty 0.15

## 2023-03-14 MED ORDER — SENNOSIDES-DOCUSATE SODIUM 8.6-50 MG PO TABS
1.0000 | ORAL_TABLET | Freq: Every day | ORAL | Status: DC
  Administered 2023-03-14 – 2023-03-16 (×3): 1 via ORAL
  Filled 2023-03-14 (×3): qty 1

## 2023-03-14 MED ORDER — ATORVASTATIN CALCIUM 40 MG PO TABS: 80.0000 mg | ORAL_TABLET | Freq: Every day | ORAL | Status: DC

## 2023-03-14 MED ORDER — ENOXAPARIN SODIUM 40 MG/0.4ML IJ SOSY
40.0000 mg | PREFILLED_SYRINGE | INTRAMUSCULAR | Status: DC
  Administered 2023-03-14: 40 mg via SUBCUTANEOUS
  Filled 2023-03-14: qty 0.4

## 2023-03-14 MED ORDER — TIZANIDINE HCL 4 MG PO TABS: 4.0000 mg | ORAL_TABLET | Freq: Three times a day (TID) | ORAL | Status: DC | PRN

## 2023-03-14 MED ORDER — SODIUM CHLORIDE 0.9 % IV SOLN
2.0000 g | Freq: Two times a day (BID) | INTRAVENOUS | Status: DC
  Administered 2023-03-14 – 2023-03-16 (×5): 2 g via INTRAVENOUS
  Filled 2023-03-14 (×5): qty 12.5

## 2023-03-14 MED ORDER — MORPHINE SULFATE (PF) 2 MG/ML IV SOLN: 2.0000 mg | INTRAVENOUS | Status: DC | PRN

## 2023-03-14 MED ORDER — TRAZODONE HCL 50 MG PO TABS: 25.0000 mg | ORAL_TABLET | Freq: Every evening | ORAL | Status: DC | PRN

## 2023-03-14 MED ORDER — LACTATED RINGERS IV SOLN
150.0000 mL/h | INTRAVENOUS | Status: DC
  Administered 2023-03-14 – 2023-03-15 (×3): 150 mL/h via INTRAVENOUS

## 2023-03-14 MED ORDER — MECLIZINE HCL 25 MG PO TABS: 25.0000 mg | ORAL_TABLET | Freq: Three times a day (TID) | ORAL | Status: DC | PRN

## 2023-03-14 MED ORDER — VANCOMYCIN HCL 2000 MG/400ML IV SOLN
2000.0000 mg | INTRAVENOUS | Status: DC
  Filled 2023-03-14: qty 400

## 2023-03-14 MED ORDER — INSULIN ASPART 100 UNIT/ML IJ SOLN
0.0000 [IU] | Freq: Three times a day (TID) | INTRAMUSCULAR | Status: DC
  Administered 2023-03-14: 3 [IU] via SUBCUTANEOUS
  Administered 2023-03-15 – 2023-03-17 (×4): 2 [IU] via SUBCUTANEOUS

## 2023-03-14 MED ORDER — SODIUM ZIRCONIUM CYCLOSILICATE 10 G PO PACK
10.0000 g | PACK | Freq: Once | ORAL | Status: AC
  Administered 2023-03-14: 10 g via ORAL
  Filled 2023-03-14: qty 1

## 2023-03-14 MED ORDER — SODIUM CHLORIDE 0.9 % IV SOLN
2.0000 g | Freq: Once | INTRAVENOUS | Status: AC
  Administered 2023-03-14: 2 g via INTRAVENOUS
  Filled 2023-03-14: qty 12.5

## 2023-03-14 MED ORDER — OXYCODONE-ACETAMINOPHEN 5-325 MG PO TABS
1.0000 | ORAL_TABLET | Freq: Four times a day (QID) | ORAL | Status: DC | PRN
  Administered 2023-03-14: 1 via ORAL
  Filled 2023-03-14: qty 1

## 2023-03-14 MED ORDER — CHLORHEXIDINE GLUCONATE CLOTH 2 % EX PADS
6.0000 | MEDICATED_PAD | Freq: Every day | CUTANEOUS | Status: DC
  Administered 2023-03-14: 6 via TOPICAL

## 2023-03-14 NOTE — ED Notes (Signed)
VBG performed by RT

## 2023-03-14 NOTE — Assessment & Plan Note (Signed)
-   We will continue Synthroid. 

## 2023-03-14 NOTE — ED Notes (Signed)
RT Note: VBG was completed and handed to Dr. Lockie Mola

## 2023-03-14 NOTE — Assessment & Plan Note (Signed)
-   This is manifested by his initial hypotension, tachycardia and tachypnea and significant cytosis. - We will continue the patient on broad-spectrum antibiotic therapy with IV vancomycin, cefepime and Flagyl. - We will continue hydration with IV normal saline. - We will follow blood cultures.

## 2023-03-14 NOTE — ED Notes (Signed)
Report given to the RN on the floor. ?

## 2023-03-14 NOTE — ED Notes (Signed)
Pt currently on bedpan attempting to have BM.

## 2023-03-14 NOTE — Telephone Encounter (Signed)
The patient was transferred to the emergency department by the Licensed Practical Nurse (LPN) and Certified Nursing Assistant (CNA). A report was provided to the charge nurse. The patient is presenting with hypotension, shortness of breath, and tachycardia. He is currently receiving 4 liters of oxygen; however, he typically uses 2 liters of oxygen at home. The charge nurse is informed of the patient's medical history.

## 2023-03-14 NOTE — ED Notes (Signed)
Called for transport spoke with [employer] at 2:05pm

## 2023-03-14 NOTE — ED Provider Notes (Signed)
[place] EMERGENCY DEPARTMENT AT [facility] Provider Note   CSN: [id number] Arrival date & time: 03/14/23  1102     History  Chief Complaint  Patient presents with   Shortness of Breath    Jeff [name] is a 57 y.o. male.  Patient sent from his oncology office for weakness, shortness of breath, abnormal vitals.  Patient just discharged in the hospital recently.  Per wife has not been eating or drinking.  She feels like he is dehydrated.  He has been getting around well.  He has a history of colon cancer with liver metastasis.  He has not had chemotherapy for a while.  He is on blood thinners.  He feels very lightheaded here this morning especially when he stands up.  He was hypotensive and tachycardic gynecology office.  They check basic labs and sent him down here with IV fluids starting.  He denies any abdominal pain chest pain but does feel short of breath at times.  He is chronically on 2 L of oxygen.  They increased his oxygen before.  He denies any difficulty having bowel movements.  Denies any pain urination.  Wife feels like he is just not thriving well at home.  Wife has noticed and confused as well.  He has liver metastasis.  He is needed fluid taken off of his abdomen here recently about a week ago while he was inpatient.  The history is provided by the patient and a caregiver.       Home Medications Prior to Admission medications   Medication Sig Start Date End Date Taking? Authorizing Provider  ACCU-CHEK GUIDE test strip CHECK BLOOD SUGAR THREE TIMES DAILY AS DIRECTED 08/14/20   [provider]  ACCU-CHEK SOFTCLIX LANCETS lancets USE  TID TO CHECK BLOOD SUGAR LEVELS 06/22/16   [provider]  apixaban (ELIQUIS) 5 MG TABS tablet Take 1 tablet (5 mg total) by mouth 2 (two) times daily. 11/18/22   Iran Ouch, MD  atorvastatin (LIPITOR) 80 MG tablet TAKE 1 TABLET BY MOUTH AT BEDTIME 03/07/23   Iran Ouch, MD  benzonatate (TESSALON)  100 MG capsule TAKE 1 CAPSULE BY MOUTH 2 TIMES DAILY AS NEEDED FOR COUGH. Patient taking differently: Take 100 mg by mouth 2 (two) times daily as needed for cough. 02/23/23   Ladene Artist, MD  CLARITIN-D 24 HOUR 10-240 MG 24 hr tablet Take 1 tablet by mouth daily.    [provider]  dapagliflozin propanediol (FARXIGA) 10 MG TABS tablet Take 10 mg by mouth daily.    [provider]  diphenoxylate-atropine (LOMOTIL) 2.5-0.025 MG tablet Take 1-2 tablets by mouth 4 (four) times daily as needed for diarrhea or loose stools. Patient not taking: Reported on 03/03/2023 06/06/22   Rana Snare, NP  docusate sodium (COLACE) 100 MG capsule Take 100 mg by mouth 2 (two) times daily.    [provider]  GLOBAL EASE INJECT PEN NEEDLES 31G X 5 MM MISC Inject 1 Syringe into the skin 4 (four) times daily. 09/18/20   [provider]  levETIRAcetam (KEPPRA) 750 MG tablet Take 1 tablet twice a day Patient taking differently: Take 750 mg by mouth 2 (two) times daily. 11/15/22   Van Clines, MD  levothyroxine (SYNTHROID) 137 MCG tablet Take 137 mcg by mouth daily before breakfast. 06/01/22   [provider]  lidocaine-prilocaine (EMLA) cream Apply 1 Application topically as needed. Apply to portacath site 1 hour prior to use Patient taking  differently: Apply 1 Application topically as needed (for port access- 1 hour prior to portacath use). 10/11/22   Rana Snare, NP  LORazepam (ATIVAN) 0.5 MG tablet Take 1 tablet (0.5 mg total) by mouth every 8 (eight) hours as needed for anxiety. 12/27/22   Rana Snare, NP  meclizine (ANTIVERT) 25 MG tablet Take 25 mg by mouth 3 (three) times daily as needed for dizziness. 02/25/16   [provider]  metFORMIN (GLUCOPHAGE-XR) 500 MG 24 hr tablet Take 1,000 mg by mouth in the morning and at bedtime. 10/30/16   [provider]  metoprolol tartrate (LOPRESSOR) 100 MG tablet Take 100 mg by mouth 2 (two) times daily.     [provider]  mirtazapine (REMERON) 15 MG tablet Take 1 tablet (15 mg total) by mouth at bedtime. 02/07/23   Rana Snare, NP  nitroGLYCERIN (NITROSTAT) 0.4 MG SL tablet DISSOLVE 1 TABLET UNDER TONGUE EVERY 5 MINUTES AS NEEDED FOR CHEST PAIN. IF NO RELIEF AFTER 3 DOSES CALL 911. Patient taking differently: Place 0.4 mg under the tongue every 5 (five) minutes x 3 doses as needed for chest pain (CALL 9-1-1, IF NO RELIEF). 06/16/21   Iran Ouch, MD  NOVOLOG FLEXPEN 100 UNIT/ML FlexPen Inject 18 Units into the skin 3 (three) times daily with meals as needed for high blood sugar.    [provider]  ondansetron (ZOFRAN) 8 MG tablet Take 1 tablet (8 mg total) by mouth every 8 (eight) hours as needed for nausea or vomiting. 03/28/22   Rana Snare, NP  oxyCODONE ER [facility] ER) 13.5 MG C12A Take 13.5 mg by mouth every 12 (twelve) hours. 10/19/21   [provider]  oxyCODONE-acetaminophen (PERCOCET) 10-325 MG tablet Take 1 tablet by mouth every 6 (six) hours as needed for pain. 12/02/20   [provider]  pantoprazole (PROTONIX) 40 MG tablet Take 40 mg by mouth 2 (two) times daily. 10/23/15   [provider]  polyethylene glycol (MIRALAX / GLYCOLAX) 17 g packet Take 17 g by mouth 2 (two) times daily. 03/06/23   Jerald Kief, MD  potassium chloride SA (KLOR-CON M) 20 MEQ tablet TAKE 1 TABLET BY MOUTH DAILY. 12/09/22   Ladene Artist, MD  promethazine (PHENERGAN) 12.5 MG tablet Take 1 tablet (12.5 mg total) by mouth every 6 (six) hours as needed. Patient taking differently: Take 12.5 mg by mouth every 6 (six) hours as needed for nausea or vomiting. 12/27/22   Rana Snare, NP  senna-docusate (SENOKOT-S) 8.6-50 MG tablet Take 1 tablet by mouth at bedtime. 03/06/23   Jerald Kief, MD  tamsulosin (FLOMAX) 0.4 MG CAPS capsule Take 0.4 mg by mouth 2 (two) times daily.    [provider]  tiZANidine (ZANAFLEX) 4 MG tablet Take 4 mg by mouth every 8  (eight) hours as needed for muscle spasms. 07/22/21   [provider]  TRESIBA FLEXTOUCH 200 UNIT/ML FlexTouch Pen Inject 80 Units into the skin at bedtime.    [provider]  venlafaxine XR (EFFEXOR-XR) 75 MG 24 hr capsule Take 225 mg by mouth daily. 09/09/19   [provider]      Allergies    Hydrocodone    Review of Systems   Review of Systems  Physical Exam Updated Vital Signs BP 135/82   Pulse (!) 119   Temp (!) 97.5 F (36.4 C) (Oral)   Resp (!) 29   SpO2 100%  Physical Exam Vitals and nursing  note reviewed.  Constitutional:      General: He is not in acute distress.    Appearance: He is ill-appearing.     Comments: Chronically ill-appearing  HENT:     Head: Normocephalic and atraumatic.     Comments: Dry mucous membranes Eyes:     Extraocular Movements: Extraocular movements intact.     Conjunctiva/sclera: Conjunctivae normal.     Pupils: Pupils are equal, round, and reactive to light.  Cardiovascular:     Rate and Rhythm: Normal rate and regular rhythm.     Heart sounds: No murmur heard. Pulmonary:     Effort: Tachypnea present. No respiratory distress.  Abdominal:     General: There is distension.     Palpations: Abdomen is soft.     Tenderness: There is no abdominal tenderness.  Musculoskeletal:        General: No swelling.     Cervical back: Neck supple.     Right lower leg: No edema.     Left lower leg: No edema.  Skin:    General: Skin is warm and dry.     Capillary Refill: Capillary refill takes less than 2 seconds.  Neurological:     General: No focal deficit present.     Mental Status: He is alert.     Cranial Nerves: No cranial nerve deficit.     Motor: No weakness.     Comments: Normal strength and sensation throughout, normal visual fields, normal speech, cranial nerves intact  Psychiatric:        Mood and Affect: Mood normal.     ED Results / Procedures / Treatments   Labs (all labs ordered are listed, but  only abnormal results are displayed) Labs Reviewed  LACTIC ACID, PLASMA - Abnormal; Notable for the following components:      Result Value   Lactic Acid, Venous 5.4 (*)    All other components within normal limits  D-DIMER, QUANTITATIVE - Abnormal; Notable for the following components:   D-Dimer, Quant >20.00 (*)    All other components within normal limits  CULTURE, BLOOD (ROUTINE X 2)  CULTURE, BLOOD (ROUTINE X 2)  URINE CULTURE  BRAIN NATRIURETIC PEPTIDE  AMMONIA  LACTIC ACID, PLASMA  URINALYSIS, ROUTINE W REFLEX MICROSCOPIC  BLOOD GAS, VENOUS  TROPONIN I (HIGH SENSITIVITY)  TROPONIN I (HIGH SENSITIVITY)    EKG EKG Interpretation Date/Time:  Tuesday March 14 2023 11:05:05 EDT Ventricular Rate:  120 PR Interval:  135 QRS Duration:  102 QT Interval:  326 QTC Calculation: 461 R Axis:   74  Text Interpretation: Sinus tachycardia Confirmed by Virgina Norfolk [phone]) on 03/14/2023 11:30:57 AM  Radiology DG Chest Portable 1 View  Result Date: 03/14/2023 CLINICAL DATA:  Shortness of breath. EXAM: PORTABLE CHEST 1 VIEW COMPARISON:  Chest radiograph dated March 06, 2023 FINDINGS: Patient is rotated to the left. The heart size is borderline enlarged. Mediastinal contours are unchanged. Right chest port catheter tip projects over the upper SVC. Known bilateral multifocal pulmonary nodules and masses are again seen. No new focal consolidation, pneumothorax or pleural effusion. The visualized skeletal structures are unchanged. IMPRESSION: Known pulmonary metastatic disease.  No acute findings in the chest. Electronically Signed   By: Hart Robinsons M.D.   On: 03/14/2023 12:44    Procedures .Critical Care  Performed by: Virgina Norfolk, DO Authorized by: Virgina Norfolk, DO   Critical care provider statement:    Critical care time (minutes):  35   Critical care was necessary to  treat or prevent imminent or life-threatening deterioration of the following conditions:  Dehydration  and renal failure   Critical care was time spent personally by me on the following activities:  Blood draw for specimens, discussions with consultants, discussions with primary provider, evaluation of patient's response to treatment, examination of patient, obtaining history from patient or surrogate, ordering and performing treatments and interventions, ordering and review of laboratory studies, ordering and review of radiographic studies, pulse oximetry, review of old charts and re-evaluation of patient's condition     Medications Ordered in ED Medications  ceFEPIme (MAXIPIME) 2 g in sodium chloride 0.9 % 100 mL IVPB (has no administration in time range)  vancomycin (VANCOCIN) IVPB 1000 mg/200 mL premix (has no administration in time range)    And  vancomycin (VANCOCIN) IVPB 1000 mg/200 mL premix (has no administration in time range)  sodium chloride 0.9 % bolus 2,000 mL (has no administration in time range)  sodium chloride 0.9 % bolus 1,000 mL (0 mLs Intravenous Stopped 03/14/23 1236)  sodium zirconium cyclosilicate (LOKELMA) packet 10 g (10 g Oral Given 03/14/23 1302)    ED Course/ Medical Decision Making/ A&P                                 Medical Decision Making Amount and/or Complexity of Data Reviewed Labs: ordered. Radiology: ordered.  Risk Prescription drug management.   Jeff [name] is here with abnormal vitals, shortness of breath.  History of colon cancer with metastasis to his lungs and liver.  Last chemotherapy was last month.  Just had recent hospital admission for infectious rule out which was unremarkable.  Seems like he has a same clinical picture today.  He is tachycardic and tachypneic but does not have fever.  Already had CBC and BMP done with oncology today where he was there to follow-up with they found him hypotensive.  He appears to have positive orthostatics with me.  His blood pressure is 130 systolic upon arrival but when he stands he gets lightheaded  and dizzy and blood pressure seems to drop.  Blood work showed a white count of 19 and a hemoglobin of 9 and a sodium of 129 and a creatinine of 2.8.  Creatinine is double his normal.  He looks clinically very dehydrated.  He had infectious workup last week with blood cultures, CT scans, peritoneal fluid analysis that was all unremarkable.  There is never really a source for infection and overall thought was likely progression of his cancer process which seems to have rapidly gotten worse here the last few months.  He is already on blood thinners.  PE seems less likely.  With the AKI it will be difficult to give him contrast at this time.  Will see if the D-dimer helps to rule this out but unlikely given his cancer history.  He is on 3 L of oxygen.  His work of breathing is normal.  I have lower suspicion for PE at this time.  He did have a PE study 2 weeks ago that was unremarkable as well. He is on anticoagulation already.  Overall I talked with his oncologist Dr. Alcide Evener, I will hydrate him pursue infectious workup again with blood cultures antibiotics BNP troponin CT abdomen pelvis as well as chest.  He does have AKI.  His blood gas showed a pH is 7.195 but his CO2 was 26.  I suspect that this is likely from  a lactic acidosis from certainly at least dehydration.  Overall we will admit to hospitalist for further care.  CT scans per my review and interpretation appears stable from few weeks ago.  Discussed pulmonary mets.  I do not see any large amount of fluid in the lungs or the abdomen.  Peritoneal changes are there.  Overall I see diffuse metastatic disease with the patient who appears to be having failure to thrive.  He is not eating or drinking much at home per wife.  Would likely benefit from palliative consultation as well.  This chart was dictated using voice recognition software.  Despite best efforts to proofread,  errors can occur which can change the documentation meaning.         Final  Clinical Impression(s) / ED Diagnoses Final diagnoses:  AKI (acute kidney injury) (HCC)  SIRS (systemic inflammatory response syndrome) (HCC)    Rx / DC Orders ED Discharge Orders     None         Virgina Norfolk, DO 03/14/23 1305

## 2023-03-14 NOTE — Progress Notes (Signed)
Jeff [name] OFFICE PROGRESS NOTE   Diagnosis: Colon cancer  INTERVAL HISTORY:   Jeff [name] returns as scheduled.  He was discharged from the hospital 03/09/2023.  He is accompanied by his wife.  She reports he developed acute onset of shortness of breath and chest pain when she pulled into the office today.  Cough overall is better.  No fever.  His wife reports confusion.  Objective:  Vital signs in last 24 hours:  Blood pressure 96/74, pulse 64, temperature (!) 97.1 F (36.2 C), temperature source Tympanic, resp. rate 18, weight 203 lb 9.6 oz (92.4 kg), SpO2 92%.   He is chronically ill-appearing. HEENT: Mouth appears dry. Resp: Breath sounds diminished at the bases.  Increased respiratory rate. Cardio: Intermittent tachycardia. GI: Abdomen distended. Vascular: No leg edema. Neuro: He follows commands.  Seems confused.  Moves all extremities. Port-A-Cath without erythema.  Lab Results:  Lab Results  Component Value Date   WBC 19.0 (H) 03/14/2023   HGB 8.9 (L) 03/14/2023   HCT 28.5 (L) 03/14/2023   MCV 87.4 03/14/2023   PLT 168 03/14/2023   NEUTROABS 15.4 (H) 03/14/2023    Imaging:  No results found.  Medications: I have reviewed the patient's current medications.  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, biopsy confirmed adenocarcinoma         CTs chest, abdomen, and pelvis on 12/11/2015-indeterminate tiny pulmonary nodules, multiple liver metastases, small nodes in the sigmoid mesocolon Laparoscopic sigmoid colectomy, liver biopsy, Port-A-Cath placement 01/14/2016 Pathology sigmoid colon resection- colonic adenocarcinoma, 5.3 cm extending into pericolonic connective tissue, positive lymph-vascular invasion, positive perineural invasion, negative margins, metastatic carcinoma in 9 of 28 lymph nodes Pathology liver biopsy-metastatic colorectal adenocarcinoma MSI stable; mismatch  repair protein normal APC alteration, K RAS wild-type, no BRAF mutation Cycle 1 FOLFOX 02/02/2016 Cycle 2 FOLFOX 02/15/2016 Cycle 3 FOLFOX 02/29/2016 Cycle 4 FOLFOX 03/14/2016 Cycle 5 FOLFOX 03/28/2016 Cycle 6 FOLFOX 04/11/2016 (oxaliplatin held secondary to thrombocytopenia) 04/21/2016 restaging CTs-difficulty evaluating liver lesions due to hepatic steatosis. Stable right adrenal nodule. No adenopathy or local recurrence near the rectosigmoid anastomotic site. Cycle 7 FOLFOX 04/25/2016 MRI liver 05/02/2016-partial improvement in hepatic metastases Cycle 8 FOLFOX 05/10/2016 Cycle 9 FOLFOX 05/23/2016 (oxaliplatin held due to thrombocytopenia) Cycle 10 FOLFOX 06/06/2016 (oxaliplatin dose reduced due to thrombocytopenia) Cycle 11 FOLFOX 06/27/2016 (oxaliplatin held due to neuropathy) Cycle 12 FOLFOX 07/11/2016 (oxaliplatin held) Initiation of maintenance Xeloda 7 days on/7 days off 07/27/2016 MRI liver 11/18/2016-decrease in hepatic metastatic disease. No new or progressive disease identified within the abdomen. Continuation of Xeloda 7 days on/7 days off MRI liver 04/27/2017-previous liver lesions not identified, no new lesions, no lymphadenopathy Xeloda continued 7 days on/7 days off MRI liver 12/04/2017 - no evidence of metastatic disease, hepatic steatosis Xeloda continued 7 days on/7 days off MRI liver 07/15/2018- no evidence of metastatic disease.  Stable severe hepatic steatosis. Xeloda continued 7 days on/7 days off MRI liver 03/16/2019-hepatic steatosis, no liver mass, focal area of intrahepatic biliary dilatation in segments 2 and 3 of the left lobe-increased Xeloda continued 7 days on/7 days off MRI abdomen 08/19/2019-no findings to suggest liver metastases.  Bilateral lung nodules measuring up to 2.8 cm, progressive and more conspicuous than on previous exam CT chest 08/29/2019-multiple pulmonary metastases, new from 04/21/2016 Cycle 1 FOLFIRI/bevacizumab 09/09/2019 Cycle 2  FOLFIRI/bevacizumab 09/26/2019  Cycle 3 FOLFIRI/bevacizumab 10/10/2019 Cycle 4 FOLFIRI/bevacizumab 10/24/2019 Cycle 5 FOLFIRI/bevacizumab 11/07/2019 CT chest 11/14/2019-decreased size of lung nodules, no new  lesions, hepatic steatosis Cycle 6 FOLFIRI/bevacizumab 11/21/2019 Cycle 7 FOLFIRI/bevacizumab 12/05/2019 Cycle 8 FOLFIRI/bevacizumab 12/19/2019 Cycle 9 FOLFIRI/bevacizumab 01/02/2020 Cycle 10 FOLFIRI/bevacizumab 01/16/2020 CT chest 01/29/2020-stable bilateral pulmonary metastases.  No new or progressive metastatic disease in the chest. Cycle 11 FOLFIRI/bevacizumab 01/30/2020 Cycle 12 FOLFIRI/bevacizumab 02/19/2020 Cycle 13 FOLFIRI/bevacizumab 03/12/2020 Cycle 14 FOLFIRI/bevacizumab 04/02/2020 Cycle 15 FOLFIRI/bevacizumab 04/23/2020 Cycle 16 FOLFIRI/bevacizumab 05/21/2020 CT chest 06/09/2020-mild progression pulmonary metastasis.  Some lesions have increased in size while others are similar. Cycle 1 irinotecan/Panitumumab 06/18/2020 Cycle 2 irinotecan/Panitumumab 07/02/2020 Cycle 3 irinotecan/panitumumab 07/16/2020 Cycle 4 irinotecan/Panitumumab 07/30/2020 Cycle 5 irinotecan/Panitumumab 08/13/2020, Emend added Cycle 6 irinotecan/Panitumumab 08/27/2020 CT chest 09/08/2020-decreased size of pulmonary nodules, no progressive disease Cycle 7 irinotecan/panitumumab 09/02/2020 Cycle 8 irinotecan/panitumumab 09/24/2020 Cycle 9 irinotecan/Panitumumab 10/08/2020 10/22/2020 treatment held due to left foot fracture, need for surgery Cycle 10 irinotecan/panitumumab 11/24/2020 Cycle 11 irinotecan/Panitumumab 12/08/2020 Cycle 12 irinotecan/panitumumab 12/22/2020 Cycle 13 irinotecan/panitumumab 01/05/2021 01/20/2021 CT chest-mixed response with minimal increase in size of some lesions and minimal decrease in the size of other lesions.  Overall number of lesions is unchanged. Cycle 14 irinotecan/Panitumumab 02/05/2021 Cycle 15 irinotecan/Panitumumab 02/23/2021 Cycle 16 irinotecan/panitumumab 03/16/2021 Cycle 17 irinotecan/Panitumumab  04/13/2021 05/03/2021-CT chest-enlargement of pulmonary metastases, no new lesions 06/21/2021-cycle 1 Lonsurf/bevacizumab 07/19/2021-cycle 2 Lonsurf/bevacizumab 08/16/2021-cycle 3 Lonsurf/bevacizumab 09/09/2021 CT chest-bilateral lung nodules and masses with mixed response, overall stable to very minimally increased 09/13/2021 cycle 4 Lonsurf/bevacizumab 10/12/2021 cycle 5 Lonsurf/bevacizumab 10/21/2021 chest CT-bilateral pulmonary metastases without significant change.  No new or progressive disease within the chest. 11/08/2021 cycle 6 Lonsurf/bevacizumab 12/06/2021 cycle 7 Lonsurf/bevacizumab 01/03/2022 cycle 8 Lonsurf/bevacizumab CTs 01/25/2022-index lung lesion stable to mildly increased in size.  Mild retroperitoneal adenopathy, mildly increased in size compared to 12/16/2020. 02/01/2022 cycle 9 Lonsurf/bevacizumab 02/28/2022 cycle 10 Lonsurf/bevacizumab 03/28/2022 cycle 11 Lonsurf/bevacizumab CTs 04/21/2022-no change in bilateral pulmonary metastases, 2 new hypoattenuating liver lesions, new enlarged portacaval node Cycle 1 fruquintinib 05/24/2022 Cycle 2 fruquintinib 06/21/2022 Cycle 3 fruquintinib 07/19/2022 CTs 08/12/2022-stable lung nodules, hilar and retroperitoneal nodes, slight increase in size of liver metastases, stable right adrenal nodule Cycle 4 fruquintinib 08/16/2022 Cycle 5 fruquintinib 09/13/2022 Cycle 6 fruquintinib 10/11/2022 CTs 10/22/2022-progressive mediastinal, pericardial, and periportal adenopathy, enlargement of liver lesions, stable pulmonary nodules Cycle 1 FOLFOX 11/14/2022 Cycle 2 FOLFOX 11/29/2022, oxaliplatin dose reduced due to thrombocytopenia Cycle 3 FOLFOX 12/27/2022 Cycle 4 FOLFOX 01/10/2023 Cycle 5 FOLFOX 01/24/2023 Cycle 6 FOLFOX 02/07/2023 CTs 02/14/2023-progressive metastases in the lungs, liver, and chest/retroperitoneal lymph nodes and new peritoneal soft tissue nodules CT chest 03/03/2023-negative for pulmonary embolism, progression of metastatic lymphadenopathy and pleural  masses, new trace right pleural effusion, small pericardial effusion CT abdomen/pelvis 03/03/2023-progressive metastatic disease in the liver and abdominal lymph nodes, development of omental caking and new small to moderate volume ascites 03/07/2023-paracentesis, cytology positive for adenocarcinoma   2.   Rectal bleeding and constipation secondary to #1   3.   History of peripheral vascular disease, status post left lower extremity vascular bypass surgery in April 2017   4.   History of nephrolithiasis   5.   History of Graves' disease treated with radioactive iodine   6.   Anxiety/depression   7.   Hypertension   8.   Hospitalization 01/19/2016 with wound dehiscence status post secondary suture closure of abdominal wall   9.   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 Hyperglycemia 06/20/2016-diagnosed with diabetes, maintained on insulin   11.  Positive COVID test 12/13/2018; positive COVID test 01/25/2021   12.  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  Admission 03/03/2023 with nausea, fever, and tachycardia 14.  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  He presents to the office today for routine follow-up since he was discharged from the hospital.  Upon arrival to the office he developed chest pain, shortness of breath.  He was found to be hypotensive, tachycardic and hypoxic.  Oxygen was increased to 4 L.  IV fluids initiated.  We discussed the possibility of infection, pulmonary embolus, cancer progression.  We transferred him to the [name] emergency department.  Patient seen with Dr. Truett Perna.    Lonna Cobb ANP/GNP-BC   03/14/2023  11:05 AM This was a shared visit with Lonna Cobb.  Jeff [name] was interviewed and examined.  He presents with acute onset dyspnea, altered mental status, and chest  discomfort.  He is hypotensive and hypoxic.  He will be referred to the emergency room for further evaluation. Jeff [name] has metastatic colon cancer with recent clinical and radiologic evidence of disease progression.  He was discharged from the hospital last week after admission with fever and tachycardia.  It was unclear whether his symptoms were related to the metastatic tumor burden or a subclinical infection.  He completed a course of antibiotics.  The differential diagnosis for today's presentation includes an acute infection, pulmonary embolism, and progression of the metastatic tumor burden.  Jeff [name] will be referred to the emergency room for acute evaluation.  He will likely be admitted.  I will see him in the hospital with a plan to discuss goals of care/hospice depending on his clinical status.  I was present for greater than 50% of today's visit.  I performed medical decision making.  Mancel Bale, MD

## 2023-03-14 NOTE — Assessment & Plan Note (Signed)
Will monitor BP before resuming antihypertensive therapy.

## 2023-03-14 NOTE — Progress Notes (Signed)
Pharmacy Antibiotic Note  Jeff [name] is a 57 y.o. male admitted on 03/14/2023.  Pharmacy has been consulted for Cefepime and Vancomycin dosing. WBC elevated at 19, Afebrile, Lactic acid 4.8  Plan: Cefepime 2g IV q12h Vancomycin 2g IV x1 given in ED, then 2g IV q48h - next dose on 10/31 Measure Vanc levels as needed.  Goal AUC = 400 - 550. Follow up renal function, culture results, and clinical course.   Height: 5\' 10"  (177.8 cm) Weight: 96.2 kg (212 lb 1.3 oz) IBW/kg (Calculated) : 73  Temp (24hrs), Avg:97.7 F (36.5 C), Min:97.1 F (36.2 C), Max:98.4 F (36.9 C)  Recent Labs  Lab 03/08/23 0701 03/09/23 0434 03/14/23 0950 03/14/23 1218 03/14/23 1407  WBC 17.6* 18.3* 19.0*  --   --   CREATININE 1.51* 1.64* 2.80*  --   --   LATICACIDVEN  --   --   --  5.4* 4.8*    Estimated Creatinine Clearance: 34.3 mL/min (A) (by C-G formula based on SCr of 2.8 mg/dL (H)).    Allergies  Allergen Reactions   Hydrocodone Hives    Antimicrobials this admission: 10/29 Vancomycin >>  10/29 Cefepime >>   Dose adjustments this admission:   Microbiology results: 10/29 BCx:  10/29 UCx:   10/29 MRSA PCR:   Thank you for allowing pharmacy to be a part of this patient's care.  Lynann Beaver PharmD, BCPS [facility] main pharmacy [phone] 03/14/2023 5:12 PM

## 2023-03-14 NOTE — Assessment & Plan Note (Signed)
-   We will continue Keppra. 

## 2023-03-14 NOTE — Assessment & Plan Note (Signed)
-   Will you Ativan and Remeron.

## 2023-03-14 NOTE — Assessment & Plan Note (Signed)
-   He has lung metastasis likely contributing to his dyspnea and cough. - He has no evidence of postobstructive pneumonia at this time. - Elevated D-dimer could be related to his lung metastasis. - We will obtain a VQ scan.

## 2023-03-14 NOTE — Assessment & Plan Note (Signed)
-   This is AKI superimposed on stage IIIa chronic kidney disease. - The patient is directly admitted to a stepdown unit bed. - We will continue additional IV normal saline. - Will follow BMP. - We will avoid nephrotoxins.

## 2023-03-14 NOTE — Plan of Care (Signed)
  Problem: Activity: Goal: Risk for activity intolerance will decrease Outcome: Progressing   Problem: Coping: Goal: Level of anxiety will decrease Outcome: Progressing   Problem: Elimination: Goal: Will not experience complications related to bowel motility Outcome: Progressing   Problem: Pain Management: Goal: General experience of comfort will improve Outcome: Not Progressing

## 2023-03-14 NOTE — ED Notes (Signed)
Report attempted to the Floor RN.

## 2023-03-14 NOTE — ED Notes (Signed)
Pt aware of the need for a urine... Unable to currently provide the sample... 

## 2023-03-14 NOTE — Progress Notes (Signed)
ED Pharmacy Antibiotic Sign Off An antibiotic consult was received from an ED provider for vancomycin and cefepime per pharmacy dosing for sepsis. A chart review was completed to assess appropriateness.   The following one time order(s) were placed:  Vancomycin 2g IV x 1 Cefepime 2gm IV x 1  Further antibiotic and/or antibiotic pharmacy consults should be ordered by the admitting provider if indicated.   Thank you for allowing pharmacy to be a part of this patient's care.   Xiomara Sevillano, Drake Leach, [facility]  Clinical Pharmacist 03/14/23 1:01 PM

## 2023-03-14 NOTE — Assessment & Plan Note (Addendum)
-   We will hold off statin therapy given elevated LFTs

## 2023-03-14 NOTE — H&P (Signed)
Jeff [name]   PATIENT NAME: Jeff [name]    MR#:  [id number]  DATE OF BIRTH:  10-12-1965  DATE OF ADMISSION:  03/14/2023  PRIMARY CARE PHYSICIAN: Simone Curia, MD   Patient is coming from: Home  REQUESTING/REFERRING PHYSICIAN: [facility].  CHIEF COMPLAINT:   Chief Complaint  Patient presents with   Shortness of Breath    HISTORY OF PRESENT ILLNESS:  Jeff [name] is a 57 y.o. male with medical history significant for metastatic colon cancer, stage IIIa chronic kidney disease, seizure disorder, anxiety, osteoarthritis, depression, GERD, hypertension, dyslipidemia, hypothyroidism and type 2 diabetes mellitus, who presented to the emergency room with acute onset of generalized weakness with associated dyspnea and significantly diminished appetite with failure to thrive.  He denied any fever or chills.  No nausea or vomiting or worsening abdominal pain.  He admits to occasional cough productive of clear sputum with his dyspnea.  No dysuria, hematuria or urinary frequency or urgency or flank pain.  He has been having diminished urine output.  He is on home O2 at 2 L/min.  He was hypotensive and tachycardic in his oncology office before he was sent to the ER.  He has not had any chemotherapy for a while.  He was very lightheaded when he stood up this morning.  No paresthesias or focal muscle weakness.  ED Course: When he came here heart rate was 115 with respiratory rate of 25 and temperature 98.4, BP 130/74 and pulse oximetry was 100% on his O2 at 2 L/min.  Labs revealed VBG with pH 7.195, HCO3 of 17 and CMP was remarkable for hyponatremia 129 and CO2 of 16, potassium 5.6 and a BUN of 56 and creatinine 2.8 above previous levels and albumin 2.8 with alk phos 345 and AST 109 with otherwise normal LFTs.  BNP was 41.5.  High sensitive troponin I was 10.  CBC showed leukocytosis of 19 with neutrophilia and anemia.  D-dimer was more than 20.  Lactic acid was 5.4 and later 4.8 and  4.1. EKG as reviewed by me : EKG showed sinus tachycardia with rate 120. Imaging: Portable chest x-ray showed known pulmonary metastatic disease with no acute findings otherwise.  The patient was given IV vancomycin and cefepime, IV morphine, 3 L bolus of IV normal saline and 10 g of p.o. Lokelma.  He will be admitted to a stepdown unit bed for further evaluation and management. PAST MEDICAL HISTORY:   Past Medical History:  Diagnosis Date   Allergic rhinitis    Anxiety    Arthritis    Chemotherapy-induced thrombocytopenia    Chronic nausea    mild intermittant and loose stools due to colon cancer   Chronic neck and back pain    Depression    ED (erectile dysfunction)    Generalized idiopathic epilepsy and epileptic syndromes, not intractable, without status epilepticus (HCC) 10/25/2019   (11-10-2020 per pt wife last seizure 11/ 2021)  neurologist-- dr Karel Jarvis---  seizure activity/ DKA ;  EEG w/ evidence epileto genicity arising right frontocentral region;  taking keppra   GERD (gastroesophageal reflux disease)    Hiatal hernia    History of 2019 novel coronavirus disease (COVID-19) 12/13/2018   per pt wife , pt had moderate symtptoms that resolved   History of Graves' disease    s/p RAI 02/ 2012;  previous seen by endocrinologist--- dr Everardo All   History of kidney stones    History of non-ST elevation myocardial infarction (NSTEMI)  Bana Borgmeyer 2014--  no CAD;  per notes probable coronary vasospasm   History of panic attacks    History of thyroid storm    Nov 2011 w/ grave's ,  s/p RAI 02/ 2012   Hyperlipidemia    Hypertension    Hypothyroidism following radioiodine therapy    RAI in 02/  2012---  previously followed by dr Everardo All,  now followed by pcp   Lower urinary tract symptoms (LUTS)    Metastatic colon cancer to liver (HCC) 11/2015   oncologist-- [facility]/ and seen by dr a. Maisie Fus;  01-14-2016 s/p sigmoid colectomy, liver bx, node dissection, PAC;   chemo started 09/ 2017  ;   04/ 2021 metastatic pulmonary nodules;   active treatment every 2 wks   Nephrolithiasis    left side non-obstructive per imaging 04/ 2022   PAD (peripheral artery disease) [facility]) cardiologist-  dr Kirke Corin   cardiologist ----  dr Judie Petit. Kirke Corin ;   left popliteal occlusion behind knee w/ collaterals;  s/p attempted angioplasty 01/ 2016;  09-11-2015 s/p pop-TPT w/ ipsNR GSV   PONV (postoperative nausea and vomiting)    Rash    due to chemo   Type 2 diabetes mellitus treated with insulin (HCC)    followed by pcp----   (11-10-2020  per pt wife blood sugar check's 2-4 times daily,  fasting sugar--- 130--168)   Unspecified fracture of left foot, initial encounter for closed fracture 10/17/2020   midfoot  -Seizure disorder  PAST SURGICAL HISTORY:   Past Surgical History:  Procedure Laterality Date   ABDOMINAL AORTAGRAM N/A 02/19/2014   Procedure: ABDOMINAL Ronny Flurry;  Surgeon: Iran Ouch, MD;  Location: [facility];  Service: Cardiovascular;  Laterality: N/A;   ANTERIOR CERVICAL DECOMP/DISCECTOMY FUSION  09/08/2010   C3 -4   CYSTOSCOPY W/ URETERAL STENT PLACEMENT Right 01/04/2015   Procedure: CYSTOSCOPY WITH RETROGRADE PYELOGRAM/URETERAL STENT PLACEMENT;  Surgeon: Marcine Matar, MD;  Location[facility] OR;  Service: Urology;  Laterality: Right;   CYSTOSCOPY W/ URETERAL STENT REMOVAL Right 01/26/2015   Procedure: CYSTOSCOPY WITH STENT REMOVAL;  Surgeon: Marcine Matar, MD;  Location: [facility];  Service: Urology;  Laterality: Right;   CYSTOSCOPY/URETEROSCOPY/HOLMIUM LASER/STENT PLACEMENT Right 01/26/2015   Procedure: CYSTOSCOPY/URETEROSCOPY RIGHT;  Surgeon: Marcine Matar, MD;  Location: [facility];  Service: Urology;  Laterality: Right;   FEMORAL-POPLITEAL BYPASS GRAFT Left 09/11/2015   Procedure: BYPASS GRAFT LEFT ABOVE KNEE TO BELOW KNEE POPLITEAL ARTERY WITH LEFT GREATER SAPHENOUS VEIN;  Surgeon: Nada Libman, MD;  Location: [facility] OR;  Service: Vascular;   Laterality: Left;   INCISIONAL HERNIA REPAIR N/A 01/19/2016   Procedure: HERNIA REPAIR INCISIONAL;  Surgeon: Romie Levee, MD;  Location: [facility] ORS;  Service: General;  Laterality: N/A;   IR PARACENTESIS  03/10/2023   LAMINECTOMY AND MICRODISCECTOMY LUMBAR SPINE  03/26/2009   left L5 -- S1   LAPAROSCOPIC SIGMOID COLECTOMY N/A 01/14/2016   Procedure: LAPAROSCOPIC SIGMOID COLECTOMY;  Surgeon: Romie Levee, MD;  Location: [facility];  Service: General;  Laterality: N/A;   LEFT HEART CATHETERIZATION WITH CORONARY ANGIOGRAM N/A 06/12/2012   Procedure: LEFT HEART CATHETERIZATION WITH CORONARY ANGIOGRAM;  Surgeon: Wendall Stade, MD;  Location: [facility] CATH LAB;  Service: Cardiovascular;  Laterality: N/A;   No sig. CAD/  normal LVF, ef 55-65%   LIVER BIOPSY N/A 01/14/2016   Procedure: LIVER BIOPSY;  Surgeon: Romie Levee, MD;  Location: [facility];  Service: General;  Laterality: N/A;   LOWER EXTREMITY ANGIOGRAM Left 05/28/2014  Procedure: LOWER EXTREMITY ANGIOGRAM;  Surgeon: Iran Ouch, MD;  Location: [facility];  Service: Cardiovascular;  Laterality: Left;  Failed PTA CTO   OPEN REDUCTION INTERNAL FIXATION (ORIF) FOOT LISFRANC FRACTURE Left 11/11/2020   Procedure: OPEN REDUCTION INTERNAL FIXATION (ORIF) FOOT LISFRANC FRACTURE;  Surgeon: Park Liter, DPM;  Location: [facility];  Service: Podiatry;  Laterality: Left;  General with Regional Block   POPLITEAL ARTERY ANGIOPLASTY Left 05/28/2014    dr Kirke Corin   Attempted and unsuccessful due to inability to cross the occlusionnotes    PORTACATH PLACEMENT Right 01/14/2016   Procedure: INSERTION PORT-A-CATH;  Surgeon: Romie Levee, MD;  Location: [facility] OR[facility];  Service: General;  Laterality: Right;   POSTERIOR CERVICAL FUSION/FORAMINOTOMY N/A 12/10/2012   Procedure: POSTERIOR CERVICAL FUSION/FORAMINOTOMY LEVEL 1;  Surgeon: Cristi Loron, MD;  Location: [facility]URO [facility];  Service: Neurosurgery;  Laterality: N/A;  Cervical three-four posterior cervical  fusion with lateral mass screws   POSTERIOR LUMBAR FUSION  11-11-2009;   07-15-2013   L5 -- S1;   L4-- S1   SHOULDER ARTHROSCOPY Right 03/08/2004   debridement labral tear/  DCR/  acromioplasty   UMBILICAL HERNIA REPAIR  1980   VEIN HARVEST Left 09/11/2015   Procedure: LEFT GREATER SAPHENOUS VEIN HARVEST;  Surgeon: Nada Libman, MD;  Location: [facility] OR;  Service: Vascular;  Laterality: Left;    SOCIAL HISTORY:   Social History   Tobacco Use   Smoking status: Never   Smokeless tobacco: Never  Substance Use Topics   Alcohol use: Yes    Comment: RARE    FAMILY HISTORY:   Family History  Problem Relation Age of Onset   Thyroid disease Mother        hypothyroidism   Heart attack Maternal Grandfather    Heart Problems Father        pacermaker   Edema Father    Heart disease Maternal Grandmother    Lung cancer Maternal Grandmother    Diabetes Maternal Grandmother    Hypertension Maternal Grandmother     DRUG ALLERGIES:   Allergies  Allergen Reactions   Hydrocodone Hives    REVIEW OF SYSTEMS:   ROS As per history of present illness. All pertinent systems were reviewed above. Constitutional, HEENT, cardiovascular, respiratory, GI, GU, musculoskeletal, neuro, psychiatric, endocrine, integumentary and hematologic systems were reviewed and are otherwise negative/unremarkable except for positive findings mentioned above in the HPI.   MEDICATIONS AT HOME:   Prior to Admission medications   Medication Sig Start Date End Date Taking? Authorizing Provider  apixaban (ELIQUIS) 5 MG TABS tablet Take 1 tablet (5 mg total) by mouth 2 (two) times daily. 11/18/22  Yes Iran Ouch, MD  atorvastatin (LIPITOR) 80 MG tablet TAKE 1 TABLET BY MOUTH AT BEDTIME 03/07/23  Yes Arida, Muhammad A, MD  benzonatate (TESSALON) 100 MG capsule TAKE 1 CAPSULE BY MOUTH 2 TIMES DAILY AS NEEDED FOR COUGH. Patient taking differently: Take 100 mg by mouth 2 (two) times daily as needed for cough.  02/23/23  Yes Ladene Artist, MD  CLARITIN-D 24 HOUR 10-240 MG 24 hr tablet Take 1 tablet by mouth daily.   Yes [provider]  dapagliflozin propanediol (FARXIGA) 10 MG TABS tablet Take 10 mg by mouth daily.   Yes [provider]  levETIRAcetam (KEPPRA) 750 MG tablet Take 1 tablet twice a day Patient taking differently: Take 750 mg by mouth 2 (two) times daily. 11/15/22  Yes Van Clines, MD  levothyroxine (SYNTHROID) 137 MCG tablet Take 137 mcg by mouth daily before breakfast. 06/01/22  Yes [provider]  lidocaine-prilocaine (EMLA) cream Apply 1 Application topically as needed. Apply to portacath site 1 hour prior to use Patient taking differently: Apply 1 Application topically as needed (for port access- 1 hour prior to portacath use). 10/11/22  Yes Rana Snare, NP  LORazepam (ATIVAN) 0.5 MG tablet Take 1 tablet (0.5 mg total) by mouth every 8 (eight) hours as needed for anxiety. 12/27/22  Yes Rana Snare, NP  meclizine (ANTIVERT) 25 MG tablet Take 25 mg by mouth 3 (three) times daily as needed for dizziness. 02/25/16  Yes [provider]  metFORMIN (GLUCOPHAGE-XR) 500 MG 24 hr tablet Take 1,000 mg by mouth in the morning and at bedtime. 10/30/16  Yes [provider]  metoprolol tartrate (LOPRESSOR) 100 MG tablet Take 100 mg by mouth 2 (two) times daily.   Yes [provider]  mirtazapine (REMERON) 15 MG tablet Take 1 tablet (15 mg total) by mouth at bedtime. 02/07/23  Yes Rana Snare, NP  NOVOLOG FLEXPEN 100 UNIT/ML FlexPen Inject 18 Units into the skin 3 (three) times daily with meals as needed for high blood sugar.   Yes [provider]  ondansetron (ZOFRAN) 8 MG tablet Take 1 tablet (8 mg total) by mouth every 8 (eight) hours as needed for nausea or vomiting. 03/28/22  Yes Rana Snare, NP  oxyCODONE ER [facility] ER) 13.5 MG C12A Take 13.5 mg by mouth every 12 (twelve) hours. 10/19/21  Yes [provider]   oxyCODONE-acetaminophen (PERCOCET) 10-325 MG tablet Take 1 tablet by mouth every 6 (six) hours as needed for pain. 12/02/20  Yes [provider]  pantoprazole (PROTONIX) 40 MG tablet Take 40 mg by mouth 2 (two) times daily. 10/23/15  Yes [provider]  polyethylene glycol (MIRALAX / GLYCOLAX) 17 g packet Take 17 g by mouth 2 (two) times daily. 03/06/23  Yes Jerald Kief, MD  potassium chloride SA (KLOR-CON M) 20 MEQ tablet TAKE 1 TABLET BY MOUTH DAILY. 12/09/22  Yes Ladene Artist, MD  promethazine (PHENERGAN) 12.5 MG tablet Take 1 tablet (12.5 mg total) by mouth every 6 (six) hours as needed. Patient taking differently: Take 12.5 mg by mouth every 6 (six) hours as needed for nausea or vomiting. 12/27/22  Yes Rana Snare, NP  senna-docusate (SENOKOT-S) 8.6-50 MG tablet Take 1 tablet by mouth at bedtime. 03/06/23  Yes Jerald Kief, MD  tamsulosin (FLOMAX) 0.4 MG CAPS capsule Take 0.4 mg by mouth 2 (two) times daily.   Yes [provider]  tiZANidine (ZANAFLEX) 4 MG tablet Take 4 mg by mouth every 8 (eight) hours as needed for muscle spasms. 07/22/21  Yes [provider]  TRESIBA FLEXTOUCH 200 UNIT/ML FlexTouch Pen Inject 80 Units into the skin at bedtime.   Yes [provider]  venlafaxine XR (EFFEXOR-XR) 75 MG 24 hr capsule Take 225 mg by mouth daily. 09/09/19  Yes [provider]  ACCU-CHEK GUIDE test strip CHECK BLOOD SUGAR THREE TIMES DAILY AS DIRECTED 08/14/20   [provider]  ACCU-CHEK SOFTCLIX LANCETS lancets USE  TID TO CHECK BLOOD SUGAR LEVELS 06/22/16   [provider]  diphenoxylate-atropine (LOMOTIL) 2.5-0.025 MG tablet Take 1-2 tablets by mouth 4 (four) times daily as needed for diarrhea or loose stools. Patient not taking: Reported on 03/03/2023 06/06/22   Rana Snare, NP  GLOBAL EASE INJECT PEN NEEDLES 31G  X 5 MM MISC Inject 1 Syringe into the skin 4 (four) times daily. 09/18/20   [provider]   nitroGLYCERIN (NITROSTAT) 0.4 MG SL tablet DISSOLVE 1 TABLET UNDER TONGUE EVERY 5 MINUTES AS NEEDED FOR CHEST PAIN. IF NO RELIEF AFTER 3 DOSES CALL 911. Patient taking differently: Place 0.4 mg under the tongue every 5 (five) minutes x 3 doses as needed for chest pain (CALL 9-1-1, IF NO RELIEF). 06/16/21   Iran Ouch, MD      VITAL SIGNS:  Blood pressure (!) 98/42, pulse (!) 109, temperature 98.4 F (36.9 C), temperature source Oral, resp. rate (!) 27, height 5\' 10"  (1.778 m), weight 96.2 kg, SpO2 100%.  PHYSICAL EXAMINATION:  Physical Exam  GENERAL:  57 y.o.-year-old patient lying in the bed with no acute distress.  EYES: Pupils equal, round, reactive to light and accommodation. No scleral icterus. Extraocular muscles intact.  HEENT: Head atraumatic, normocephalic. Oropharynx and nasopharynx clear.  NECK:  Supple, no jugular venous distention. No thyroid enlargement, no tenderness.  LUNGS: Normal breath sounds bilaterally, no wheezing, rales,rhonchi or crepitation. No use of accessory muscles of respiration.  CARDIOVASCULAR: Regular rate and rhythm, S1, S2 normal. No murmurs, rubs, or gallops.  ABDOMEN: Soft, nondistended, nontender. Bowel sounds present. No organomegaly or mass.  EXTREMITIES: No pedal edema, cyanosis, or clubbing.  NEUROLOGIC: Cranial nerves II through XII are intact. Muscle strength 5/5 in all extremities. Sensation intact. Gait not checked.  PSYCHIATRIC: The patient is alert and oriented x 3.  Normal affect and good eye contact. SKIN: No obvious rash, lesion, or ulcer.   LABORATORY PANEL:   CBC Recent Labs  Lab 03/14/23 0950 03/14/23 1231  WBC 19.0*  --   HGB 8.9* 9.2*  HCT 28.5* 27.0*  PLT 168  --    ------------------------------------------------------------------------------------------------------------------  Chemistries  Recent Labs  Lab 03/09/23 0434 03/14/23 0950 03/14/23 1231  NA 132* 129* 133*  K 4.8 5.6* 5.5*  CL 109 103  --   CO2  15* 16*  --   GLUCOSE 124* 212*  --   BUN 24* 56*  --   CREATININE 1.64* 2.80*  --   CALCIUM 7.4* 8.4*  --   MG 1.8  --   --   AST  --  109*  --   ALT  --  25  --   ALKPHOS  --  345*  --   BILITOT  --  0.5  --    ------------------------------------------------------------------------------------------------------------------  Cardiac Enzymes No results for input(s): "TROPONINI" in the last 168 hours. ------------------------------------------------------------------------------------------------------------------  RADIOLOGY:  CT CHEST ABDOMEN PELVIS WO CONTRAST  Result Date: 03/14/2023 CLINICAL DATA:  Sepsis. Shortness of breath, back pain. Hypoxia. History of metastatic colon cancer. EXAM: CT CHEST, ABDOMEN AND PELVIS WITHOUT CONTRAST TECHNIQUE: Multidetector CT imaging of the chest, abdomen and pelvis was performed following the standard protocol without IV contrast. RADIATION DOSE REDUCTION: This exam was performed according to the departmental dose-optimization program which includes automated exposure control, adjustment of the mA and/or kV according to patient size and/or use of iterative reconstruction technique. COMPARISON:  03/03/2023 FINDINGS: CT CHEST FINDINGS Cardiovascular: Heart is normal size. Aorta is normal caliber. Right Port-A-Cath remains in place with the tip in the SVC. Mediastinum/Nodes: Mediastinal and bilateral hilar adenopathy again noted, not significantly changed. No axillary adenopathy. Trachea and esophagus are unremarkable. Thyroid unremarkable. Lungs/Pleura: Bilateral pulmonary nodules and masses compatible with metastatic disease, stable. No acute infiltrates or effusions. Musculoskeletal: Chest wall soft tissues are unremarkable. No  acute or suspicious osseous abnormality. CT ABDOMEN PELVIS FINDINGS Hepatobiliary: Numerous lesions throughout the liver compatible with metastases, unchanged. Small layering gallstones within the gallbladder. No biliary ductal  dilatation. Pancreas: No focal abnormality or ductal dilatation. Spleen: No focal abnormality.  Normal size. Adrenals/Urinary Tract: 4 mm nonobstructing stone in the midpole of the left kidney. Punctate 1-2 mm stone in the midpole of the right kidney. No ureteral stones or hydronephrosis. Small right adrenal nodule, 12 mm, stable. Urinary bladder unremarkable. Stomach/Bowel: Postoperative changes in the sigmoid colon. No bowel obstruction. Stomach and small bowel decompressed, unremarkable. Vascular/Lymphatic: Aortic atherosclerosis. No aneurysm. Portal caval adenopathy is difficult to separate from adjacent structures on this noncontrast CT but visually appears similar to prior study. Other adjacent retroperitoneal lymph nodes such as a retrocaval lymph node on image 68 is stable. Reproductive: No visible focal abnormality. Other: Diffuse omental caking and moderate to large volume ascites, increasing since prior study. Musculoskeletal: Postoperative changes in the lumbar spine. No acute or suspicious bony abnormality. IMPRESSION: Extensive metastatic disease in the chest, abdomen and pelvis not significantly changed since recent study. Omental caking throughout the abdomen and pelvis with increasing ascites. Cholelithiasis.  Bilateral nephrolithiasis. Aortic atherosclerosis. No acute findings. Electronically Signed   By: Charlett Nose M.D.   On: 03/14/2023 13:13   DG Chest Portable 1 View  Result Date: 03/14/2023 CLINICAL DATA:  Shortness of breath. EXAM: PORTABLE CHEST 1 VIEW COMPARISON:  Chest radiograph dated March 06, 2023 FINDINGS: Patient is rotated to the left. The heart size is borderline enlarged. Mediastinal contours are unchanged. Right chest port catheter tip projects over the upper SVC. Known bilateral multifocal pulmonary nodules and masses are again seen. No new focal consolidation, pneumothorax or pleural effusion. The visualized skeletal structures are unchanged. IMPRESSION: Known pulmonary  metastatic disease.  No acute findings in the chest. Electronically Signed   By: Hart Robinsons M.D.   On: 03/14/2023 12:44      IMPRESSION AND PLAN:  Assessment and Plan: Sepsis due to undetermined organism [facility]) - This is manifested by his initial hypotension, tachycardia and tachypnea and significant cytosis. - We will continue the patient on broad-spectrum antibiotic therapy with IV vancomycin, cefepime and Flagyl. - We will continue hydration with IV normal saline. - We will follow blood cultures.  Acute kidney injury superimposed on chronic kidney disease (HCC) - This is AKI superimposed on stage IIIa chronic kidney disease. - The patient is directly admitted to a stepdown unit bed. - We will continue additional IV normal saline. - Will follow BMP. - We will avoid nephrotoxins.  Seizure disorder (HCC) - We will continue Keppra.  Metastatic colon cancer to liver [facility]) - He has lung metastasis likely contributing to his dyspnea and cough. - He has no evidence of postobstructive pneumonia at this time. - Elevated D-dimer could be related to his lung metastasis. - We will obtain a VQ scan.  Hypothyroidism - We will continue Synthroid.  Dyslipidemia - We will hold off statin therapy given elevated LFTs.  Anxiety and depression - Will you Ativan and Remeron.  Essential hypertension Will monitor BP before resuming antihypertensive therapy.       DVT prophylaxis: Lovenox.  Advanced Care Planning:  Code Status: full code.  Family Communication:  The plan of care was discussed in details with the patient (and family). I answered all questions. The patient agreed to proceed with the above mentioned plan. Further management will depend upon hospital course. Disposition Plan: Back to previous home environment  Consults called: none.  All the records are reviewed and case discussed with ED provider.  Status is: Inpatient    At the time of the admission, it appears  that the appropriate admission status for this patient is inpatient.  This is judged to be reasonable and necessary in order to provide the required intensity of service to ensure the patient's safety given the presenting symptoms, physical exam findings and initial radiographic and laboratory data in the context of comorbid conditions.  The patient requires inpatient status due to high intensity of service, high risk of further deterioration and high frequency of surveillance required.  I certify that at the time of admission, it is my clinical judgment that the patient will require inpatient hospital care extending more than 2 midnights.                            Dispo: The patient is from: Home              Anticipated d/c is to: Home              Patient currently is not medically stable to d/c.              Difficult to place patient: No  Hannah Beat M.D on 03/14/2023 at 7:28 PM  [facility]   From 7 PM-7 AM, contact night-coverage [email or web address]  CC: Primary care physician; Simone Curia, MD

## 2023-03-14 NOTE — ED Triage Notes (Signed)
Pt from cancer center with c/o acute shob and back pain. Reports pt hypoxic upon arrival to cancer center, placed on 4L O2. Pt uses 2L at home. Marland Kitchen

## 2023-03-15 ENCOUNTER — Inpatient Hospital Stay (HOSPITAL_COMMUNITY): Payer: Medicaid Other

## 2023-03-15 DIAGNOSIS — E872 Acidosis, unspecified: Secondary | ICD-10-CM | POA: Insufficient documentation

## 2023-03-15 DIAGNOSIS — R6510 Systemic inflammatory response syndrome (SIRS) of non-infectious origin without acute organ dysfunction: Secondary | ICD-10-CM | POA: Diagnosis not present

## 2023-03-15 LAB — BASIC METABOLIC PANEL
Anion gap: 10 (ref 5–15)
Anion gap: 8 (ref 5–15)
BUN: 56 mg/dL — ABNORMAL HIGH (ref 6–20)
BUN: 57 mg/dL — ABNORMAL HIGH (ref 6–20)
CO2: 13 mmol/L — ABNORMAL LOW (ref 22–32)
CO2: 13 mmol/L — ABNORMAL LOW (ref 22–32)
Calcium: 7.1 mg/dL — ABNORMAL LOW (ref 8.9–10.3)
Calcium: 7 mg/dL — ABNORMAL LOW (ref 8.9–10.3)
Chloride: 107 mmol/L (ref 98–111)
Chloride: 112 mmol/L — ABNORMAL HIGH (ref 98–111)
Creatinine, Ser: 2.62 mg/dL — ABNORMAL HIGH (ref 0.61–1.24)
Creatinine, Ser: 2.56 mg/dL — ABNORMAL HIGH (ref 0.61–1.24)
GFR, Estimated: 28 mL/min — ABNORMAL LOW (ref >60)
GFR, Estimated: 29 mL/min — ABNORMAL LOW (ref >60)
Glucose, Bld: 149 mg/dL — ABNORMAL HIGH (ref 70–99)
Glucose, Bld: 121 mg/dL — ABNORMAL HIGH (ref 70–99)
Potassium: 5.7 mmol/L — ABNORMAL HIGH (ref 3.5–5.1)
Potassium: 5.1 mmol/L (ref 3.5–5.1)
Sodium: 130 mmol/L — ABNORMAL LOW (ref 135–145)
Sodium: 133 mmol/L — ABNORMAL LOW (ref 135–145)

## 2023-03-15 LAB — CORTISOL-AM, BLOOD: Cortisol - AM: 25.1 ug/dL — ABNORMAL HIGH (ref 6.7–22.6)

## 2023-03-15 LAB — CBC
HCT: 23.9 % — ABNORMAL LOW (ref 39.0–52.0)
Hemoglobin: 7.3 g/dL — ABNORMAL LOW (ref 13.0–17.0)
MCH: 27.7 pg (ref 26.0–34.0)
MCHC: 30.5 g/dL (ref 30.0–36.0)
MCV: 90.5 fL (ref 80.0–100.0)
Platelets: 143 10*3/uL — ABNORMAL LOW (ref 150–400)
RBC: 2.64 MIL/uL — ABNORMAL LOW (ref 4.22–5.81)
RDW: 18.9 % — ABNORMAL HIGH (ref 11.5–15.5)
WBC: 19 10*3/uL — ABNORMAL HIGH (ref 4.0–10.5)
nRBC: 0 % (ref 0.0–0.2)

## 2023-03-15 LAB — GLUCOSE, CAPILLARY
Glucose-Capillary: 141 mg/dL — ABNORMAL HIGH (ref 70–99)
Glucose-Capillary: 134 mg/dL — ABNORMAL HIGH (ref 70–99)
Glucose-Capillary: 114 mg/dL — ABNORMAL HIGH (ref 70–99)
Glucose-Capillary: 124 mg/dL — ABNORMAL HIGH (ref 70–99)

## 2023-03-15 LAB — HEMOGLOBIN A1C
Hgb A1c MFr Bld: 7.2 % — ABNORMAL HIGH (ref 4.8–5.6)
Mean Plasma Glucose: 159.94 mg/dL

## 2023-03-15 LAB — PROTIME-INR
INR: 2.5 — ABNORMAL HIGH (ref 0.8–1.2)
Prothrombin Time: 27.4 s — ABNORMAL HIGH (ref 11.4–15.2)

## 2023-03-15 LAB — URINE CULTURE: Culture: NO GROWTH

## 2023-03-15 LAB — PROCALCITONIN: Procalcitonin: 4.66 ng/mL

## 2023-03-15 LAB — MRSA NEXT GEN BY PCR, NASAL: MRSA by PCR Next Gen: NOT DETECTED

## 2023-03-15 MED ORDER — SODIUM CHLORIDE 0.45 % IV SOLN
INTRAVENOUS | Status: AC
  Filled 2023-03-15 (×5): qty 75

## 2023-03-15 MED ORDER — CHLORHEXIDINE GLUCONATE CLOTH 2 % EX PADS
6.0000 | MEDICATED_PAD | Freq: Every day | CUTANEOUS | Status: DC
  Administered 2023-03-15 – 2023-03-16 (×2): 6 via TOPICAL

## 2023-03-15 MED ORDER — TECHNETIUM TO 99M ALBUMIN AGGREGATED
4.1000 | Freq: Once | INTRAVENOUS | Status: AC
  Administered 2023-03-15: 4.1 via INTRAVENOUS

## 2023-03-15 MED ORDER — SODIUM ZIRCONIUM CYCLOSILICATE 10 G PO PACK
10.0000 g | PACK | Freq: Once | ORAL | Status: AC
  Administered 2023-03-15: 10 g via ORAL
  Filled 2023-03-15: qty 1

## 2023-03-15 MED ORDER — DEXTROSE 50 % IV SOLN
1.0000 | Freq: Once | INTRAVENOUS | Status: AC
  Administered 2023-03-15: 50 mL via INTRAVENOUS
  Filled 2023-03-15: qty 50

## 2023-03-15 MED ORDER — SODIUM CHLORIDE 0.9 % IV BOLUS
1000.0000 mL | Freq: Once | INTRAVENOUS | Status: AC
  Administered 2023-03-15: 1000 mL via INTRAVENOUS

## 2023-03-15 MED ORDER — INSULIN ASPART 100 UNIT/ML IV SOLN
10.0000 [IU] | Freq: Once | INTRAVENOUS | Status: AC
  Administered 2023-03-15: 10 [IU] via INTRAVENOUS

## 2023-03-15 MED ORDER — PHENYLEPHRINE 80 MCG/ML (10ML) SYRINGE FOR IV PUSH (FOR BLOOD PRESSURE SUPPORT)
PREFILLED_SYRINGE | INTRAVENOUS | Status: AC
  Filled 2023-03-15: qty 10

## 2023-03-15 NOTE — Evaluation (Signed)
Occupational Therapy Evaluation Patient Details Name: Jeff [name] MRN: [id number] DOB: [birth year] Today's Date: 03/15/2023   History of Present Illness Pt is a 57 y/o male admitted 03/14/23 from onocologist office with weakness, shortness of breath, hypotensive and tachycardic. Pt hospitalized 10/18-20/24. 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 Pt with decreased arousal, which is improved in sitting - but Pt cannot sustain. In June Pt was walking without DME 500 ft. Today Pt is max A for LB ADL, max to min A for UB ADL. Mod A +2 for transfers and steps up the bed. Very poor cognition. Please see ADL and Cognition sections below. Pt also with deficits in balance, strength, activity tolerance. At this time, Pt will benefit from skilled OT in the acute setting as well as afterwards at the <3 hour daily therapy rehab setting. Next session plan for OOB with hopeful improvement in cognitive status.       If plan is discharge home, recommend the following: A lot of help with walking and/or transfers;A lot of help with bathing/dressing/bathroom;Assistance with cooking/housework;Direct supervision/assist for medications management;Direct supervision/assist for financial management;Assist for transportation;Help with stairs or ramp for entrance    Functional Status Assessment  Patient has had a recent decline in their functional status and demonstrates the ability to make significant improvements in function in a reasonable and predictable amount of time.  Equipment Recommendations  Wheelchair (measurements OT);Wheelchair cushion  (measurements OT);BSC/3in1    Recommendations for Other Services PT consult;Speech consult     Precautions / Restrictions Precautions Precautions: Fall Precaution Comments: R chest port access Restrictions Weight Bearing Restrictions: No      Mobility Bed Mobility Overal bed mobility: Needs Assistance Bed Mobility: Supine to Sit, Sit to Supine     Supine to sit: Mod assist, +2 for physical assistance, +2 for safety/equipment, HOB elevated, Used rails Sit to supine: Mod assist, +2 for physical assistance, +2 for safety/equipment   General bed mobility comments: multimodal cues for all aspects of bed mobility, after tactile cues good initiation but needing mod A for LB and trunk elevation as well as descent back to bed    Transfers Overall transfer level: Needs assistance Equipment used: Rolling walker (2 wheels) Transfers: Sit to/from Stand Sit to Stand: Mod assist, +2 physical assistance, +2 safety/equipment           General transfer comment: Pt with multiple sit<>stand from the bed progressing from mod A +2 to min A +2 depending on arousal. Pt benefitted from higher bed, but would sit quickly/impulsively as he fatigued very quickly      Balance Overall balance assessment: Needs assistance Sitting-balance support: Feet supported Sitting balance-Leahy Scale: Poor Sitting balance - Comments: requires guarding for safety   Standing balance support: Bilateral upper extremity supported, During functional activity, Reliant on assistive device for balance Standing balance-Leahy Scale: Poor Standing balance comment: dependent on RW although able to step out of underwear                           ADL either performed or assessed with clinical judgement   ADL Overall ADL's : Needs assistance/impaired Eating/Feeding: Minimal assistance;Bed level (HOB elevated)  Grooming: Wash/dry face;Minimal assistance;Cueing for sequencing;Sitting Grooming Details (indicate  cue type and reason): simple tasks, will need more assist for complex/multistep tasks Upper Body Bathing: Maximal assistance   Lower Body Bathing: Total assistance   Upper Body Dressing : Maximal assistance   Lower Body Dressing: Maximal assistance;Bed level   Toilet Transfer: Moderate assistance;+2 for physical assistance;+2 for safety/equipment;Rolling walker (2 wheels) Toilet Transfer Details (indicate cue type and reason): multimodal cues - steps up the bed Toileting- Clothing Manipulation and Hygiene: Maximal assistance;+2 for safety/equipment;Sit to/from stand Toileting - Clothing Manipulation Details (indicate cue type and reason): to manage underwear     Functional mobility during ADLs: Moderate assistance;+2 for physical assistance;+2 for safety/equipment;Rolling walker (2 wheels);Cueing for sequencing;Cueing for safety General ADL Comments: decreased cognition, balance, strength, activity tolerance     Vision Ability to See in Adequate Light: 0 Adequate Patient Visual Report: No change from baseline Additional Comments: not assessed this session     Perception         Praxis         Pertinent Vitals/Pain Pain Assessment Pain Assessment: Faces Faces Pain Scale: No hurt Pain Intervention(s): Monitored during session, Repositioned     Extremity/Trunk Assessment Upper Extremity Assessment Upper Extremity Assessment: Generalized weakness   Lower Extremity Assessment Lower Extremity Assessment: Defer to PT evaluation       Communication Communication Communication: Difficulty following commands/understanding Following commands: Follows one step commands consistently;Follows one step commands with increased time Cueing Techniques: Verbal cues;Gestural cues;Tactile cues;Visual cues   Cognition Arousal: Lethargic Behavior During Therapy: WFL for tasks assessed/performed Overall Cognitive Status: Impaired/Different from baseline Area of Impairment: Orientation,  Attention, Memory, Following commands, Safety/judgement, Awareness, Problem solving                 Orientation Level: Disoriented to, Place, Time, Situation (could pick given options for place. not oriented at all with time or situation) Current Attention Level: Focused Memory: Decreased short-term memory, Decreased recall of precautions Following Commands: Follows one step commands consistently, Follows one step commands with increased time Safety/Judgement: Decreased awareness of safety, Decreased awareness of deficits Awareness: Intellectual Problem Solving: Slow processing, Decreased initiation, Difficulty sequencing, Requires verbal cues, Requires tactile cues General Comments: Pt pleasant and cooperative, very lethargic - better arousal in sitting. Pt could not tell month (even with options) and answering questions innappropriately - when asked about his favorite candy he stated "[employer]" benefitted from tactile cues due to arousal     General Comments  VSS throughout session. Trouble with BP cuff reading properly, adn attempted multiple times. RN aware.    Exercises     Shoulder Instructions      Home Living Family/patient expects to be discharged to:: Private residence Living Arrangements: Spouse/significant other Available Help at Discharge: Family;Available PRN/intermittently Type of Home: House Home Access: Stairs to enter [employer] of Steps: 2 Entrance Stairs-Rails: None Home Layout: One level     Bathroom Shower/Tub: Chief Strategy Officer: Standard Bathroom Accessibility: Yes How Accessible: Accessible via walker     Additional Comments: Home set up from admission in June. Pt unreliable historian at this time.      Prior Functioning/Environment Prior Level of Function : Independent/Modified Independent;Driving;History of Falls (last six months)             Mobility Comments: independent without DME (in June) ADLs  Comments: independent (in June)        OT Problem List: Decreased strength;Decreased activity tolerance;Impaired balance (sitting and/or  standing);Decreased cognition;Decreased safety awareness;Cardiopulmonary status limiting activity;Obesity      OT Treatment/Interventions: Self-care/ADL training;DME and/or AE instruction;Therapeutic activities;Cognitive remediation/compensation;Patient/family education;Balance training    OT Goals(Current goals can be found in the care plan section) Acute Rehab OT Goals Patient Stated Goal: none stated OT Goal Formulation: Patient unable to participate in goal setting Time For Goal Achievement: 03/29/23 Potential to Achieve Goals: Fair ADL Goals Pt Will Perform Grooming: with modified independence;sitting Pt Will Perform Upper Body Dressing: with modified independence;sitting Pt Will Perform Lower Body Dressing: with supervision;sit to/from stand Pt Will Transfer to Toilet: with supervision;ambulating Pt Will Perform Toileting - Clothing Manipulation and hygiene: with supervision;sit to/from stand  OT Frequency: Min 1X/week    Co-evaluation PT/OT/SLP Co-Evaluation/Treatment: Yes Reason for Co-Treatment: Necessary to address cognition/behavior during functional activity;For patient/therapist safety;To address functional/ADL transfers PT goals addressed during session: Mobility/safety with mobility;Balance;Proper use of DME;Strengthening/ROM OT goals addressed during session: ADL's and self-care;Strengthening/ROM;Proper use of Adaptive equipment and DME      AM-PAC OT "6 Clicks" Daily Activity     Outcome Measure Help from another person eating meals?: A Little Help from another person taking care of personal grooming?: A Little Help from another person toileting, which includes using toliet, bedpan, or urinal?: A Lot Help from another person bathing (including washing, rinsing, drying)?: A Lot Help from another person to put on and taking off  regular upper body clothing?: A Little Help from another person to put on and taking off regular lower body clothing?: A Lot 6 Click Score: 15   End of Session Equipment Utilized During Treatment: Gait belt;Rolling walker (2 wheels) Nurse Communication: Mobility status;Precautions  Activity Tolerance: Patient limited by lethargy Patient left: in bed;with call bell/phone within reach;with bed alarm set (all 4 rails up)  OT Visit Diagnosis: Unsteadiness on feet (R26.81);Other abnormalities of gait and mobility (R26.89);Muscle weakness (generalized) (M62.81);Adult, failure to thrive (R62.7);Other symptoms and signs involving cognitive function                Time: 0902-0926 OT Time Calculation (min): 24 min Charges:  OT General Charges $OT Visit: 1 Visit OT Evaluation $OT Eval Moderate Complexity: 1 Mod  Jeff [name] OTR/L Acute Rehabilitation Services Office: [phone]  Jeff [name] [facility] 03/15/2023, 10:42 AM

## 2023-03-15 NOTE — Progress Notes (Signed)
IP PROGRESS NOTE  Subjective:   Jeff [name] was admitted yesterday with hypotension, tachycardia, hypoxia, and altered mental status.  He reports back pain.  This is chronic.  No other complaint.  Objective: Vital signs in last 24 hours: Blood pressure 102/63, pulse (!) 119, temperature 97.6 F (36.4 C), temperature source Oral, resp. rate 20, height 5\' 10"  (1.778 m), weight 212 lb 1.3 oz (96.2 kg), SpO2 99%.  Intake/Output from previous day: 10/29 0701 - 10/30 0700 In: 3473.9 [I.V.:2078.9; IV Piggyback:1395] Out: -   Physical Exam:  HEENT: No thrush Lungs: Clear anteriorly Cardiac: Regular rate and rhythm Abdomen: Distended with ascites, nontender Extremities: No leg edema Neurologic: Alert, oriented to place and diagnosis.  Knows my name.  Moves all extremities to command.  Portacath/PICC-without erythema  Lab Results: Recent Labs    03/14/23 0950 03/14/23 1231 03/15/23 0554  WBC 19.0*  --  19.0*  HGB 8.9* 9.2* 7.3*  HCT 28.5* 27.0* 23.9*  PLT 168  --  143*    BMET Recent Labs    03/14/23 0950 03/14/23 1231 03/15/23 0554  NA 129* 133* 130*  K 5.6* 5.5* 5.7*  CL 103  --  107  CO2 16*  --  13*  GLUCOSE 212*  --  149*  BUN 56*  --  56*  CREATININE 2.80*  --  2.62*  CALCIUM 8.4*  --  7.1*    Lab Results  Component Value Date   CEA1 3.19 09/10/2020   CEA 86.38 (H) 02/21/2023    Studies/Results: CT CHEST ABDOMEN PELVIS WO CONTRAST  Result Date: 03/14/2023 CLINICAL DATA:  Sepsis. Shortness of breath, back pain. Hypoxia. History of metastatic colon cancer. EXAM: CT CHEST, ABDOMEN AND PELVIS WITHOUT CONTRAST TECHNIQUE: Multidetector CT imaging of the chest, abdomen and pelvis was performed following the standard protocol without IV contrast. RADIATION DOSE REDUCTION: This exam was performed according to the departmental dose-optimization program which includes automated exposure control, adjustment of the mA and/or kV according to patient size and/or use of  iterative reconstruction technique. COMPARISON:  03/03/2023 FINDINGS: CT CHEST FINDINGS Cardiovascular: Heart is normal size. Aorta is normal caliber. Right Port-A-Cath remains in place with the tip in the SVC. Mediastinum/Nodes: Mediastinal and bilateral hilar adenopathy again noted, not significantly changed. No axillary adenopathy. Trachea and esophagus are unremarkable. Thyroid unremarkable. Lungs/Pleura: Bilateral pulmonary nodules and masses compatible with metastatic disease, stable. No acute infiltrates or effusions. Musculoskeletal: Chest wall soft tissues are unremarkable. No acute or suspicious osseous abnormality. CT ABDOMEN PELVIS FINDINGS Hepatobiliary: Numerous lesions throughout the liver compatible with metastases, unchanged. Small layering gallstones within the gallbladder. No biliary ductal dilatation. Pancreas: No focal abnormality or ductal dilatation. Spleen: No focal abnormality.  Normal size. Adrenals/Urinary Tract: 4 mm nonobstructing stone in the midpole of the left kidney. Punctate 1-2 mm stone in the midpole of the right kidney. No ureteral stones or hydronephrosis. Small right adrenal nodule, 12 mm, stable. Urinary bladder unremarkable. Stomach/Bowel: Postoperative changes in the sigmoid colon. No bowel obstruction. Stomach and small bowel decompressed, unremarkable. Vascular/Lymphatic: Aortic atherosclerosis. No aneurysm. Portal caval adenopathy is difficult to separate from adjacent structures on this noncontrast CT but visually appears similar to prior study. Other adjacent retroperitoneal lymph nodes such as a retrocaval lymph node on image 68 is stable. Reproductive: No visible focal abnormality. Other: Diffuse omental caking and moderate to large volume ascites, increasing since prior study. Musculoskeletal: Postoperative changes in the lumbar spine. No acute or suspicious bony abnormality. IMPRESSION: Extensive metastatic disease in the  chest, abdomen and pelvis not significantly  changed since recent study. Omental caking throughout the abdomen and pelvis with increasing ascites. Cholelithiasis.  Bilateral nephrolithiasis. Aortic atherosclerosis. No acute findings. Electronically Signed   By: Charlett Nose M.D.   On: 03/14/2023 13:13   DG Chest Portable 1 View  Result Date: 03/14/2023 CLINICAL DATA:  Shortness of breath. EXAM: PORTABLE CHEST 1 VIEW COMPARISON:  Chest radiograph dated March 06, 2023 FINDINGS: Patient is rotated to the left. The heart size is borderline enlarged. Mediastinal contours are unchanged. Right chest port catheter tip projects over the upper SVC. Known bilateral multifocal pulmonary nodules and masses are again seen. No new focal consolidation, pneumothorax or pleural effusion. The visualized skeletal structures are unchanged. IMPRESSION: Known pulmonary metastatic disease.  No acute findings in the chest. Electronically Signed   By: Hart Robinsons M.D.   On: 03/14/2023 12:44    Medications: I have reviewed the patient's current medications.  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, biopsy confirmed adenocarcinoma         CTs chest, abdomen, and pelvis on 12/11/2015-indeterminate tiny pulmonary nodules, multiple liver metastases, small nodes in the sigmoid mesocolon Laparoscopic sigmoid colectomy, liver biopsy, Port-A-Cath placement 01/14/2016 Pathology sigmoid colon resection- colonic adenocarcinoma, 5.3 cm extending into pericolonic connective tissue, positive lymph-vascular invasion, positive perineural invasion, negative margins, metastatic carcinoma in 9 of 28 lymph nodes Pathology liver biopsy-metastatic colorectal adenocarcinoma MSI stable; mismatch repair protein normal APC alteration, K RAS wild-type, no BRAF mutation Cycle 1 FOLFOX 02/02/2016 Cycle 2 FOLFOX 02/15/2016 Cycle 3 FOLFOX 02/29/2016 Cycle 4 FOLFOX 03/14/2016 Cycle 5 FOLFOX 03/28/2016 Cycle 6 FOLFOX  04/11/2016 (oxaliplatin held secondary to thrombocytopenia) 04/21/2016 restaging CTs-difficulty evaluating liver lesions due to hepatic steatosis. Stable right adrenal nodule. No adenopathy or local recurrence near the rectosigmoid anastomotic site. Cycle 7 FOLFOX 04/25/2016 MRI liver 05/02/2016-partial improvement in hepatic metastases Cycle 8 FOLFOX 05/10/2016 Cycle 9 FOLFOX 05/23/2016 (oxaliplatin held due to thrombocytopenia) Cycle 10 FOLFOX 06/06/2016 (oxaliplatin dose reduced due to thrombocytopenia) Cycle 11 FOLFOX 06/27/2016 (oxaliplatin held due to neuropathy) Cycle 12 FOLFOX 07/11/2016 (oxaliplatin held) Initiation of maintenance Xeloda 7 days on/7 days off 07/27/2016 MRI liver 11/18/2016-decrease in hepatic metastatic disease. No new or progressive disease identified within the abdomen. Continuation of Xeloda 7 days on/7 days off MRI liver 04/27/2017-previous liver lesions not identified, no new lesions, no lymphadenopathy Xeloda continued 7 days on/7 days off MRI liver 12/04/2017 - no evidence of metastatic disease, hepatic steatosis Xeloda continued 7 days on/7 days off MRI liver 07/15/2018- no evidence of metastatic disease.  Stable severe hepatic steatosis. Xeloda continued 7 days on/7 days off MRI liver 03/16/2019-hepatic steatosis, no liver mass, focal area of intrahepatic biliary dilatation in segments 2 and 3 of the left lobe-increased Xeloda continued 7 days on/7 days off MRI abdomen 08/19/2019-no findings to suggest liver metastases.  Bilateral lung nodules measuring up to 2.8 cm, progressive and more conspicuous than on previous exam CT chest 08/29/2019-multiple pulmonary metastases, new from 04/21/2016 Cycle 1 FOLFIRI/bevacizumab 09/09/2019 Cycle 2 FOLFIRI/bevacizumab 09/26/2019  Cycle 3 FOLFIRI/bevacizumab 10/10/2019 Cycle 4 FOLFIRI/bevacizumab 10/24/2019 Cycle 5 FOLFIRI/bevacizumab 11/07/2019 CT chest 11/14/2019-decreased size of lung nodules, no new lesions, hepatic  steatosis Cycle 6 FOLFIRI/bevacizumab 11/21/2019 Cycle 7 FOLFIRI/bevacizumab 12/05/2019 Cycle 8 FOLFIRI/bevacizumab 12/19/2019 Cycle 9 FOLFIRI/bevacizumab 01/02/2020 Cycle 10 FOLFIRI/bevacizumab 01/16/2020 CT chest 01/29/2020-stable bilateral pulmonary metastases.  No new or progressive metastatic disease in the chest. Cycle 11 FOLFIRI/bevacizumab 01/30/2020 Cycle 12 FOLFIRI/bevacizumab 02/19/2020 Cycle 13 FOLFIRI/bevacizumab 03/12/2020  Cycle 14 FOLFIRI/bevacizumab 04/02/2020 Cycle 15 FOLFIRI/bevacizumab 04/23/2020 Cycle 16 FOLFIRI/bevacizumab 05/21/2020 CT chest 06/09/2020-mild progression pulmonary metastasis.  Some lesions have increased in size while others are similar. Cycle 1 irinotecan/Panitumumab 06/18/2020 Cycle 2 irinotecan/Panitumumab 07/02/2020 Cycle 3 irinotecan/panitumumab 07/16/2020 Cycle 4 irinotecan/Panitumumab 07/30/2020 Cycle 5 irinotecan/Panitumumab 08/13/2020, Emend added Cycle 6 irinotecan/Panitumumab 08/27/2020 CT chest 09/08/2020-decreased size of pulmonary nodules, no progressive disease Cycle 7 irinotecan/panitumumab 09/02/2020 Cycle 8 irinotecan/panitumumab 09/24/2020 Cycle 9 irinotecan/Panitumumab 10/08/2020 10/22/2020 treatment held due to left foot fracture, need for surgery Cycle 10 irinotecan/panitumumab 11/24/2020 Cycle 11 irinotecan/Panitumumab 12/08/2020 Cycle 12 irinotecan/panitumumab 12/22/2020 Cycle 13 irinotecan/panitumumab 01/05/2021 01/20/2021 CT chest-mixed response with minimal increase in size of some lesions and minimal decrease in the size of other lesions.  Overall number of lesions is unchanged. Cycle 14 irinotecan/Panitumumab 02/05/2021 Cycle 15 irinotecan/Panitumumab 02/23/2021 Cycle 16 irinotecan/panitumumab 03/16/2021 Cycle 17 irinotecan/Panitumumab 04/13/2021 05/03/2021-CT chest-enlargement of pulmonary metastases, no new lesions 06/21/2021-cycle 1 Lonsurf/bevacizumab 07/19/2021-cycle 2 Lonsurf/bevacizumab 08/16/2021-cycle 3 Lonsurf/bevacizumab 09/09/2021 CT  chest-bilateral lung nodules and masses with mixed response, overall stable to very minimally increased 09/13/2021 cycle 4 Lonsurf/bevacizumab 10/12/2021 cycle 5 Lonsurf/bevacizumab 10/21/2021 chest CT-bilateral pulmonary metastases without significant change.  No new or progressive disease within the chest. 11/08/2021 cycle 6 Lonsurf/bevacizumab 12/06/2021 cycle 7 Lonsurf/bevacizumab 01/03/2022 cycle 8 Lonsurf/bevacizumab CTs 01/25/2022-index lung lesion stable to mildly increased in size.  Mild retroperitoneal adenopathy, mildly increased in size compared to 12/16/2020. 02/01/2022 cycle 9 Lonsurf/bevacizumab 02/28/2022 cycle 10 Lonsurf/bevacizumab 03/28/2022 cycle 11 Lonsurf/bevacizumab CTs 04/21/2022-no change in bilateral pulmonary metastases, 2 new hypoattenuating liver lesions, new enlarged portacaval node Cycle 1 fruquintinib 05/24/2022 Cycle 2 fruquintinib 06/21/2022 Cycle 3 fruquintinib 07/19/2022 CTs 08/12/2022-stable lung nodules, hilar and retroperitoneal nodes, slight increase in size of liver metastases, stable right adrenal nodule Cycle 4 fruquintinib 08/16/2022 Cycle 5 fruquintinib 09/13/2022 Cycle 6 fruquintinib 10/11/2022 CTs 10/22/2022-progressive mediastinal, pericardial, and periportal adenopathy, enlargement of liver lesions, stable pulmonary nodules Cycle 1 FOLFOX 11/14/2022 Cycle 2 FOLFOX 11/29/2022, oxaliplatin dose reduced due to thrombocytopenia Cycle 3 FOLFOX 12/27/2022 Cycle 4 FOLFOX 01/10/2023 Cycle 5 FOLFOX 01/24/2023 Cycle 6 FOLFOX 02/07/2023 CTs 02/14/2023-progressive metastases in the lungs, liver, and chest/retroperitoneal lymph nodes and new peritoneal soft tissue nodules CT chest 03/03/2023-negative for pulmonary embolism, progression of metastatic lymphadenopathy and pleural masses, new trace right pleural effusion, small pericardial effusion CT abdomen/pelvis 03/03/2023-progressive metastatic disease in the liver and abdominal lymph nodes, development of omental caking and new small  to moderate volume ascites 03/07/2023-paracentesis, cytology positive for adenocarcinoma CTs 03/14/2023-no change in extensive metastatic disease in the lungs, liver, and lymph nodes.  Omental caking with increased ascites   2.   Rectal bleeding and constipation secondary to #1   3.   History of peripheral vascular disease, status post left lower extremity vascular bypass surgery in April 2017   4.   History of nephrolithiasis   5.   History of Graves' disease treated with radioactive iodine   6.   Anxiety/depression   7.   Hypertension   8.   Hospitalization 01/19/2016 with wound dehiscence status post secondary suture closure of abdominal wall   9.   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 Hyperglycemia 06/20/2016-diagnosed with diabetes, maintained on insulin   11.  Positive COVID test 12/13/2018; positive COVID test 01/25/2021   12.  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  Admission 03/03/2023 with nausea, fever, and tachycardia 14.  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  Admission  03/14/2023 with altered mental status, hypotension, tachycardia, renal failure and hypoxia   Mr. Allard is well-known to me with a history of metastatic colon cancer.  There has been recent clinical and radiologic evidence of disease progression after multiple systemic treatment regimens.  Standard treatment options are limited.  He is scheduled to see Dr. Maryruth Hancock at [facility] next week to consider clinical trial options.  I am concerned his performance status will not allow for further treatment.  He is now admitted with symptoms suggestive of sepsis syndrome.  The differential diagnosis includes pulmonary embolism.  His symptoms could also be related to the metastatic tumor burden.  There is no apparent source for infection.  Admission cultures are pending.  I discussed the  poor prognosis with Mr. Semel this morning (though he is mildly confused).  I spoke with his wife.  She understands the poor prognosis and agrees to consider hospice care.  I think he should be supported with antibiotics and intravenous hydration over the next few days with follow-up of the admission cultures.  I will recommend hospice care if his status does not improve.  I will see him again tomorrow and begin a CODE STATUS discussion.  Recommendations: 1.  Continue antibiotics, follow-up cultures 2.  Continue narcotic analgesics for pain, he is maintained on OxyContin for chronic back pain 3.  Repeat palliative therapeutic paracentesis as needed, send fluid for culture 4.  I will continue discussions with Mr. Badders and his wife regarding goals of care and hospice   LOS: 1 day   Thornton Papas, MD   03/15/2023, 8:10 AM

## 2023-03-15 NOTE — Evaluation (Signed)
Physical Therapy Evaluation Patient Details Name: Jeff [name] MRN: [id number] DOB: [birth year] Today's Date: 03/15/2023  History of Present Illness  Pt is a 57 y/o male admitted 03/14/23 from onocologist office with weakness, shortness of breath, hypotensive and tachycardic. Pt hospitalized 10/18-20/24. PMH includes metastatic colon cancer, stage IIIa chronic kidney disease, seizure disorder, anxiety, osteoarthritis, depression, GERD, hypertension, dyslipidemia, hypothyroidism and type 2 diabetes mellitus CVA 6/24 right frontal lobe with involvement of the precentral gyrus and pre motor region, corona radiata, and operculum consistent acute infarcts  Clinical Impression  Pt admitted with above diagnosis.  Pt currently with functional limitations due to the deficits listed below (see PT Problem List). Pt will benefit from acute skilled PT to increase their independence and safety with mobility to allow discharge.     The patient is lethargic, arouses briefly and able to mobilize to sitting and stood at [facility] with close min assistance for safety due lethargy.  Patient speech garbled at times.  Patient resides with spouse,  Posey Rea of level of assistance most recently, EPic notes report 500' ambulation, Patient patient unable to state and family not present.  Patient poorly following directions and decreased arousal.\ Patient will benefit from continued inpatient follow up therapy, <3 hours/day HR 112-130 with activity.  BP 103/67.         If plan is discharge home, recommend the following: Two people to help with walking and/or transfers;A lot of help with bathing/dressing/bathroom;Assistance with cooking/housework;Direct supervision/assist for financial management;Help with stairs or ramp for entrance   Can travel by private vehicle   No    Equipment Recommendations None recommended by PT  Recommendations for Other Services       Functional Status Assessment Patient has had a recent  decline in their functional status and demonstrates the ability to make significant improvements in function in a reasonable and predictable amount of time.     Precautions / Restrictions Precautions Precautions: Fall Precaution Comments: R chest port access Restrictions Weight Bearing Restrictions: No      Mobility  Bed Mobility   Bed Mobility: Supine to Sit, Sit to Supine     Supine to sit: Mod assist, +2 for physical assistance, +2 for safety/equipment, HOB elevated, Used rails Sit to supine: Mod assist, +2 for physical assistance, +2 for safety/equipment   General bed mobility comments: multimodal cues for all aspects of bed mobility, after tactile cues good initiation but needing mod A for LB and trunk elevation as well as descent back to bed    Transfers Overall transfer level: Needs assistance Equipment used: Rolling walker (2 wheels)   Sit to Stand: Mod assist, +2 physical assistance, +2 safety/equipment                Ambulation/Gait                  Stairs            Wheelchair Mobility     Tilt Bed    Modified Rankin (Stroke Patients Only)       Balance Overall balance assessment: Needs assistance Sitting-balance support: Feet supported Sitting balance-Leahy Scale: Poor Sitting balance - Comments: requires guarding for safety, drifts off and is not attending to activity, required assist with urinal   Standing balance support: Bilateral upper extremity supported, During functional activity, Reliant on assistive device for balance Standing balance-Leahy Scale: Poor Standing balance comment: dependent on RW although able to step out of underwear  Pertinent Vitals/Pain Pain Assessment Faces Pain Scale: No hurt    Home Living Family/patient expects to be discharged to:: Private residence Living Arrangements: Spouse/significant other Available Help at Discharge: Family;Available  PRN/intermittently Type of Home: House Home Access: Stairs to enter Entrance Stairs-Rails: None Entrance Stairs-Number of Steps: 2   Home Layout: One level   Additional Comments: Home set up from admission in June. Pt unreliable historian at this time.    Prior Function Prior Level of Function : Independent/Modified Independent;Driving;History of Falls (last six months)             Mobility Comments: independent without DME (in June) ADLs Comments: independent (in June)     Extremity/Trunk Assessment   Upper Extremity Assessment Upper Extremity Assessment: Generalized weakness    Lower Extremity Assessment Lower Extremity Assessment: Generalized weakness       Communication   Communication Communication: Difficulty following commands/understanding Following commands: Follows one step commands consistently;Follows one step commands with increased time Cueing Techniques: Verbal cues;Gestural cues;Tactile cues;Visual cues  Cognition Arousal: Lethargic Behavior During Therapy: Flat affect Overall Cognitive Status: Impaired/Different from baseline Area of Impairment: Orientation, Attention, Memory, Following commands, Safety/judgement, Awareness, Problem solving                 Orientation Level: Disoriented to, Place, Time, Situation Current Attention Level: Focused Memory: Decreased short-term memory, Decreased recall of precautions Following Commands: Follows one step commands consistently, Follows one step commands with increased time Safety/Judgement: Decreased awareness of safety, Decreased awareness of deficits Awareness: Intellectual Problem Solving: Slow processing, Decreased initiation, Difficulty sequencing, Requires verbal cues, Requires tactile cues General Comments: Pt pleasant and cooperative, very lethargic - better arousal in sitting. Pt could not tell month (even with options) and answering questions innappropriately - when asked about his favorite  candy he stated "[employer]" benefitted from tactile cues due to arousal, speech not clear        General Comments General comments (skin integrity, edema, etc.): VSS throughout session. Trouble with BP cuff reading properly, adn attempted multiple times. RN aware.    Exercises     Assessment/Plan    PT Assessment Patient needs continued PT services  PT Problem List Decreased strength;Decreased mobility;Decreased safety awareness;Decreased activity tolerance;Decreased balance;Decreased knowledge of use of DME;Pain;Decreased cognition       PT Treatment Interventions DME instruction;Therapeutic exercise;Gait training;Balance training;Functional mobility training;Therapeutic activities;Patient/family education    PT Goals (Current goals can be found in the Care Plan section)  Acute Rehab PT Goals PT Goal Formulation: Patient unable to participate in goal setting Time For Goal Achievement: 03/29/23 Potential to Achieve Goals: Fair    Frequency Min 1X/week     Co-evaluation PT/OT/SLP Co-Evaluation/Treatment: Yes Reason for Co-Treatment: Necessary to address cognition/behavior during functional activity;For patient/therapist safety;To address functional/ADL transfers PT goals addressed during session: Mobility/safety with mobility;Balance;Proper use of DME;Strengthening/ROM OT goals addressed during session: ADL's and self-care;Strengthening/ROM;Proper use of Adaptive equipment and DME       AM-PAC PT "6 Clicks" Mobility  Outcome Measure Help needed turning from your back to your side while in a flat bed without using bedrails?: A Lot Help needed moving from lying on your back to sitting on the side of a flat bed without using bedrails?: A Lot Help needed moving to and from a bed to a chair (including a wheelchair)?: A Lot Help needed standing up from a chair using your arms (e.g., wheelchair or bedside chair)?: A Lot Help needed to walk in hospital room?: Total Help needed  climbing 3-5 steps with a railing? : Total 6 Click Score: 10    End of Session Equipment Utilized During Treatment: Gait belt Activity Tolerance: Patient limited by fatigue;Patient limited by lethargy Patient left: in bed;with bed alarm set;with call bell/phone within reach Nurse Communication: Mobility status PT Visit Diagnosis: Unsteadiness on feet (R26.81);Muscle weakness (generalized) (M62.81);Difficulty in walking, not elsewhere classified (R26.2)    Time: 1191-4782 PT Time Calculation (min) (ACUTE ONLY): 22 min   Charges:   PT Evaluation $PT Eval Low Complexity: 1 Low   PT General Charges $$ ACUTE PT VISIT: 1 Visit         Blanchard Kelch PT Acute Rehabilitation Services Office [phone] Weekend [phone]   Rada Hay 03/15/2023, 12:02 PM

## 2023-03-15 NOTE — IPAL (Signed)
  Interdisciplinary Goals of Care Family Meeting   Date carried out: 03/15/2023  Location of the meeting: Bedside  Member's involved: Physician and Bedside Registered Nurse  Durable Power of Attorney or acting medical decision maker: wife laverne    Discussion: We discussed goals of care for [employer] .  We discussed his advanced metastatic colon cancer, recent hospital admission, current poor functional status and steady decline, evidence of multi-organ failure and poor prognosis. We discussed the concept of comfort care. We also discussed code status. Wife elects to make patient DNR/DNI, wishes to discuss things further with other family members prior to making a decision about comfort care. In the interim we will continue with our current scope of care.   Code status:   Code Status: Prior   Disposition: Continue current acute care  Time spent for the meeting: 20 min    Silvano Bilis, MD  03/15/2023, 4:25 PM

## 2023-03-15 NOTE — Progress Notes (Addendum)
PROGRESS NOTE    Jeff [name]  KVQ:[id number] DOB: 1965/06/07 DOA: 03/14/2023 PCP: Simone Curia, MD  Outpatient Specialists: oncology    Brief Narrative:   From admission h and p  Jeff [name] is a 57 y.o. male with medical history significant for metastatic colon cancer, stage IIIa chronic kidney disease, seizure disorder, anxiety, osteoarthritis, depression, GERD, hypertension, dyslipidemia, hypothyroidism and type 2 diabetes mellitus, who presented to the emergency room with acute onset of generalized weakness with associated dyspnea and significantly diminished appetite with failure to thrive.  He denied any fever or chills.  No nausea or vomiting or worsening abdominal pain.  He admits to occasional cough productive of clear sputum with his dyspnea.  No dysuria, hematuria or urinary frequency or urgency or flank pain.  He has been having diminished urine output.  He is on home O2 at 2 L/min.  He was hypotensive and tachycardic in his oncology office before he was sent to the ER.  He has not had any chemotherapy for a while.  He was very lightheaded when he stood up this morning.  No paresthesias or focal muscle weakness.   Assessment & Plan:   Active Problems:   Sepsis due to undetermined organism (HCC)   Acute kidney injury superimposed on chronic kidney disease (HCC)   Metastatic colon cancer to liver (HCC)   Seizure disorder (HCC)   Dyslipidemia   Hypothyroidism   Essential hypertension   Anxiety and depression  # SIRS # Hypotension No source identified. Metastatic burden clearly playing a significant role, may be only active process - continue vanc/cefepime/flagyl - monitor cultures - paracentesis as below - continue fluids - pccm consult if hypotension does not respond to fluids  # Ascites Malignant but here with SIRS picture, need to r/o sbp - IR paracentesis with culture  # Metabolic acidosis # Encephalopathy 2/2 dehydration, aki, possible infection - abx  as above - switch fluids to bicarb  # Hyperkalemia 2/2 AKI. 5.7 today no EKG changes - continue fluids - give lokelma - insulin/glucose - trend - maintain tele  # Acute urinary retention Likely 2/2 abdominal mets. Bladder scan showing 650 today and hasn't urinated - place Foley  # End-of-life care # Metastatic colon cancer Scheduled to see Dr. Maryruth Hancock @ [facility] next week to consider clinical trial options but per oncology note today performance status may not allow further treatments. Prognosis is poor, patient appears to be in dying process. Hospice appears appropriate - no answer when wife called today, will try again later - palliative consult  # AKI Cr 2.62, baseline around 1, likely prerenal from reduced po - continue fluids for now  # Elevated Dimer Likely from above inflammatory processes - vq scan ordered and is pending  # Anemia Hgb 7.3 today baseline is in the 9s, no report of bleeding.  - monitor, transfuse prn  # Seizure disorder - home keppra  # Hypothyroidism - home synhroid  # T2DM Mild glucose elevation here - semglee qhs - SSI    # PAD - home apixaban on hold 2/2 down-trending hgb     DVT prophylaxis: SCDs Code Status: full Family Communication: daughter updated @ bedside  Level of care: Stepdown Status is: Inpatient Remains inpatient appropriate because: severity of illness    Consultants:  none  Procedures: pending  Antimicrobials:  Vanc/cefepime/flagyl    Subjective Resting in bed, confused  Objective: Vitals:   03/15/23 1000 03/15/23 1100 03/15/23 1108 03/15/23 1137  BP: (!) 88/58 Marland Kitchen)  87/53 93/65 101/67  Pulse:      Resp: (!) 28 (!) 27 (!) 27 (!) 31  Temp:      TempSrc:      SpO2:      Weight:      Height:        Intake/Output Summary (Last 24 hours) at 03/15/2023 1152 Last data filed at 03/15/2023 1148 Gross per 24 hour  Intake 4793.29 ml  Output --  Net 4793.29 ml   Filed Weights   03/14/23 1610   Weight: 96.2 kg    Examination:  General exam: Appears calm, chronically ill appearing  Respiratory system: Clear to auscultation. Tachypneic Cardiovascular system: S1 & S2 heard, tachycardic, soft systolic murmur Gastrointestinal system: Abdomen is distended, soft and nontender.   Central nervous system: awake, confused, moving all 4 Extremities: warm, no edema Skin: No visible rashes, lesions or ulcers Psychiatry: calm, confused    Data Reviewed: I have personally reviewed following labs and imaging studies  CBC: Recent Labs  Lab 03/09/23 0434 03/14/23 0950 03/14/23 1231 03/15/23 0554  WBC 18.3* 19.0*  --  19.0*  NEUTROABS 14.2* 15.4*  --   --   HGB 9.8* 8.9* 9.2* 7.3*  HCT 31.1* 28.5* 27.0* 23.9*  MCV 88.6 87.4  --  90.5  PLT 141* 168  --  143*   Basic Metabolic Panel: Recent Labs  Lab 03/09/23 0434 03/14/23 0950 03/14/23 1231 03/15/23 0554  NA 132* 129* 133* 130*  K 4.8 5.6* 5.5* 5.7*  CL 109 103  --  107  CO2 15* 16*  --  13*  GLUCOSE 124* 212*  --  149*  BUN 24* 56*  --  56*  CREATININE 1.64* 2.80*  --  2.62*  CALCIUM 7.4* 8.4*  --  7.1*  MG 1.8  --   --   --    GFR: Estimated Creatinine Clearance: 36.6 mL/min (A) (by C-G formula based on SCr of 2.62 mg/dL (H)). Liver Function Tests: Recent Labs  Lab 03/14/23 0950  AST 109*  ALT 25  ALKPHOS 345*  BILITOT 0.5  PROT 6.6  ALBUMIN 2.8*   No results for input(s): "LIPASE", "AMYLASE" in the last 168 hours. Recent Labs  Lab 03/14/23 1218  AMMONIA 26   Coagulation Profile: Recent Labs  Lab 03/15/23 0554  INR 2.5*   Cardiac Enzymes: No results for input(s): "CKTOTAL", "CKMB", "CKMBINDEX", "TROPONINI" in the last 168 hours. BNP (last 3 results) No results for input(s): "PROBNP" in the last 8760 hours. HbA1C: Recent Labs    03/14/23 2057 03/14/23 2318  HGBA1C 7.5* 7.2*   CBG: Recent Labs  Lab 03/09/23 0721 03/09/23 1145 03/14/23 1805 03/14/23 2159 03/15/23 0811  GLUCAP 110*  133* 155* 146* 141*   Lipid Profile: No results for input(s): "CHOL", "HDL", "LDLCALC", "TRIG", "CHOLHDL", "LDLDIRECT" in the last 72 hours. Thyroid Function Tests: No results for input(s): "TSH", "T4TOTAL", "FREET4", "T3FREE", "THYROIDAB" in the last 72 hours. Anemia Panel: No results for input(s): "VITAMINB12", "FOLATE", "FERRITIN", "TIBC", "IRON", "RETICCTPCT" in the last 72 hours. Urine analysis:    Component Value Date/Time   COLORURINE YELLOW 03/14/2023 1456   APPEARANCEUR CLEAR 03/14/2023 1456   LABSPEC 1.022 03/14/2023 1456   PHURINE 5.5 03/14/2023 1456   GLUCOSEU >1,000 (A) 03/14/2023 1456   HGBUR NEGATIVE 03/14/2023 1456   BILIRUBINUR NEGATIVE 03/14/2023 1456   KETONESUR NEGATIVE 03/14/2023 1456   PROTEINUR TRACE (A) 03/14/2023 1456   UROBILINOGEN 0.2 01/06/2015 0547   NITRITE NEGATIVE 03/14/2023 1456  LEUKOCYTESUR NEGATIVE 03/14/2023 1456   Sepsis Labs: @LABRCNTIP (procalcitonin:4,lacticidven:4)  ) Recent Results (from the past 240 hour(s))  Body fluid culture w Gram Stain     Status: None   Collection Time: 03/07/23 12:29 PM   Specimen: PATH Cytology Peritoneal fluid  Result Value Ref Range Status   Specimen Description   Final    PERITONEAL Performed at [facility], [address], [place], [place] [address]    Special Requests   Final    NONE Performed at [facility], [address] [place], [place] [address]    Gram Stain NO WBC SEEN NO ORGANISMS SEEN   Final   Culture   Final    NO GROWTH 3 DAYS Performed at [facility], [address], [place], [place] [address]    Report Status 03/10/2023 FINAL  Final  Blood culture (routine x 2)     Status: None (Preliminary result)   Collection Time: 03/14/23 11:23 AM   Specimen: BLOOD  Result Value Ref Range Status   Specimen Description   Final    BLOOD RIGHT ANTECUBITAL Performed at [facility], [address] [place], [place]  [address]    Special Requests   Final    BOTTLES DRAWN AEROBIC AND ANAEROBIC Blood Culture results may not be optimal due to an excessive volume of blood received in culture bottles Performed at [facility], [address], [place], [place] [address]    Culture   Final    NO GROWTH < 24 HOURS Performed at [facility], [address] [place], [place] [address]    Report Status PENDING  Incomplete  Blood culture (routine x 2)     Status: None (Preliminary result)   Collection Time: 03/14/23 11:28 AM   Specimen: BLOOD  Result Value Ref Range Status   Specimen Description   Final    BLOOD LEFT ANTECUBITAL Performed at [facility], [address], [place], [place] [address]    Special Requests   Final    BOTTLES DRAWN AEROBIC AND ANAEROBIC Blood Culture adequate volume Performed at [facility], [address] [place], [place] [address]    Culture   Final    NO GROWTH < 24 HOURS Performed at [facility], [address] [place], [place] [address]    Report Status PENDING  Incomplete  MRSA Next Gen by PCR, Nasal     Status: None   Collection Time: 03/14/23  6:17 PM   Specimen: Nasal Mucosa; Nasal Swab  Result Value Ref Range Status   MRSA by PCR Next Gen NOT DETECTED NOT DETECTED Final    Comment: (NOTE) The GeneXpert MRSA Assay (FDA approved for NASAL specimens only), is one component of a comprehensive MRSA colonization surveillance program. It is not intended to diagnose MRSA infection nor to guide or monitor treatment for MRSA infections. Test performance is not FDA approved in patients less than 57 years old. Performed at [facility], [address] [place], [place] [address]          Radiology Studies: CT CHEST ABDOMEN PELVIS WO CONTRAST  Result Date: 03/14/2023 CLINICAL DATA:  Sepsis. Shortness of breath, back pain. Hypoxia. History of metastatic colon cancer. EXAM: CT  CHEST, ABDOMEN AND PELVIS WITHOUT CONTRAST TECHNIQUE: Multidetector CT imaging of the chest, abdomen and pelvis was performed following the standard protocol without IV contrast. RADIATION DOSE REDUCTION: This exam was performed according to the  departmental dose-optimization program which includes automated exposure control, adjustment of the mA and/or kV according to patient size and/or use of iterative reconstruction technique. COMPARISON:  03/03/2023 FINDINGS: CT CHEST FINDINGS Cardiovascular: Heart is normal size. Aorta is normal caliber. Right Port-A-Cath remains in place with the tip in the SVC. Mediastinum/Nodes: Mediastinal and bilateral hilar adenopathy again noted, not significantly changed. No axillary adenopathy. Trachea and esophagus are unremarkable. Thyroid unremarkable. Lungs/Pleura: Bilateral pulmonary nodules and masses compatible with metastatic disease, stable. No acute infiltrates or effusions. Musculoskeletal: Chest wall soft tissues are unremarkable. No acute or suspicious osseous abnormality. CT ABDOMEN PELVIS FINDINGS Hepatobiliary: Numerous lesions throughout the liver compatible with metastases, unchanged. Small layering gallstones within the gallbladder. No biliary ductal dilatation. Pancreas: No focal abnormality or ductal dilatation. Spleen: No focal abnormality.  Normal size. Adrenals/Urinary Tract: 4 mm nonobstructing stone in the midpole of the left kidney. Punctate 1-2 mm stone in the midpole of the right kidney. No ureteral stones or hydronephrosis. Small right adrenal nodule, 12 mm, stable. Urinary bladder unremarkable. Stomach/Bowel: Postoperative changes in the sigmoid colon. No bowel obstruction. Stomach and small bowel decompressed, unremarkable. Vascular/Lymphatic: Aortic atherosclerosis. No aneurysm. Portal caval adenopathy is difficult to separate from adjacent structures on this noncontrast CT but visually appears similar to prior study. Other adjacent retroperitoneal  lymph nodes such as a retrocaval lymph node on image 68 is stable. Reproductive: No visible focal abnormality. Other: Diffuse omental caking and moderate to large volume ascites, increasing since prior study. Musculoskeletal: Postoperative changes in the lumbar spine. No acute or suspicious bony abnormality. IMPRESSION: Extensive metastatic disease in the chest, abdomen and pelvis not significantly changed since recent study. Omental caking throughout the abdomen and pelvis with increasing ascites. Cholelithiasis.  Bilateral nephrolithiasis. Aortic atherosclerosis. No acute findings. Electronically Signed   By: Charlett Nose M.D.   On: 03/14/2023 13:13   DG Chest Portable 1 View  Result Date: 03/14/2023 CLINICAL DATA:  Shortness of breath. EXAM: PORTABLE CHEST 1 VIEW COMPARISON:  Chest radiograph dated March 06, 2023 FINDINGS: Patient is rotated to the left. The heart size is borderline enlarged. Mediastinal contours are unchanged. Right chest port catheter tip projects over the upper SVC. Known bilateral multifocal pulmonary nodules and masses are again seen. No new focal consolidation, pneumothorax or pleural effusion. The visualized skeletal structures are unchanged. IMPRESSION: Known pulmonary metastatic disease.  No acute findings in the chest. Electronically Signed   By: Hart Robinsons M.D.   On: 03/14/2023 12:44        Scheduled Meds:  Chlorhexidine Gluconate Cloth  6 each Topical Q2200   enoxaparin (LOVENOX) injection  40 mg Subcutaneous Q24H   insulin aspart  0-15 Units Subcutaneous TID WC   insulin aspart  0-5 Units Subcutaneous QHS   insulin glargine-yfgn  15 Units Subcutaneous QHS   levETIRAcetam  750 mg Oral BID   levothyroxine  137 mcg Oral QAC breakfast   metoprolol tartrate  100 mg Oral BID   midodrine  10 mg Oral TID WC   mirtazapine  15 mg Oral QHS   oxyCODONE  10 mg Oral Q12H   pantoprazole  40 mg Oral BID   phenylephrine       polyethylene glycol  17 g Oral BID    senna-docusate  1 tablet Oral QHS   sodium zirconium cyclosilicate  10 g Oral Once   venlafaxine XR  225 mg Oral Daily   Continuous Infusions:  sodium chloride Stopped (03/14/23 1500)   ceFEPime (MAXIPIME) IV Stopped (  03/14/23 2254)   metronidazole Stopped (03/15/23 1025)   sodium bicarbonate 75 mEq in sodium chloride 0.45 % 1,075 mL infusion     sodium chloride     [START ON 03/16/2023] vancomycin       LOS: 1 day     Silvano Bilis, MD [facility]   If 7PM-7AM, please contact night-coverage [email or web address] Password [email or web address] 03/15/2023, 11:52 AM

## 2023-03-16 ENCOUNTER — Inpatient Hospital Stay (HOSPITAL_COMMUNITY): Payer: Medicaid Other

## 2023-03-16 DIAGNOSIS — R6510 Systemic inflammatory response syndrome (SIRS) of non-infectious origin without acute organ dysfunction: Secondary | ICD-10-CM

## 2023-03-16 LAB — BASIC METABOLIC PANEL
Anion gap: 10 (ref 5–15)
BUN: 65 mg/dL — ABNORMAL HIGH (ref 6–20)
CO2: 12 mmol/L — ABNORMAL LOW (ref 22–32)
Calcium: 6.9 mg/dL — ABNORMAL LOW (ref 8.9–10.3)
Chloride: 110 mmol/L (ref 98–111)
Creatinine, Ser: 3.01 mg/dL — ABNORMAL HIGH (ref 0.61–1.24)
GFR, Estimated: 24 mL/min — ABNORMAL LOW (ref >60)
Glucose, Bld: 145 mg/dL — ABNORMAL HIGH (ref 70–99)
Potassium: 5.2 mmol/L — ABNORMAL HIGH (ref 3.5–5.1)
Sodium: 132 mmol/L — ABNORMAL LOW (ref 135–145)

## 2023-03-16 LAB — BODY FLUID CELL COUNT WITH DIFFERENTIAL
Eos, Fluid: 0 %
Lymphs, Fluid: 7 %
Monocyte-Macrophage-Serous Fluid: 12 % — ABNORMAL LOW (ref 50–90)
Neutrophil Count, Fluid: 81 % — ABNORMAL HIGH (ref 0–25)
Total Nucleated Cell Count, Fluid: 2566 uL — ABNORMAL HIGH (ref 0–1000)

## 2023-03-16 LAB — CBC
HCT: 23.6 % — ABNORMAL LOW (ref 39.0–52.0)
Hemoglobin: 7.2 g/dL — ABNORMAL LOW (ref 13.0–17.0)
MCH: 28 pg (ref 26.0–34.0)
MCHC: 30.5 g/dL (ref 30.0–36.0)
MCV: 91.8 fL (ref 80.0–100.0)
Platelets: 137 10*3/uL — ABNORMAL LOW (ref 150–400)
RBC: 2.57 MIL/uL — ABNORMAL LOW (ref 4.22–5.81)
RDW: 19.2 % — ABNORMAL HIGH (ref 11.5–15.5)
WBC: 18 10*3/uL — ABNORMAL HIGH (ref 4.0–10.5)
nRBC: 0.1 % (ref 0.0–0.2)

## 2023-03-16 LAB — GLUCOSE, CAPILLARY
Glucose-Capillary: 130 mg/dL — ABNORMAL HIGH (ref 70–99)
Glucose-Capillary: 124 mg/dL — ABNORMAL HIGH (ref 70–99)
Glucose-Capillary: 146 mg/dL — ABNORMAL HIGH (ref 70–99)

## 2023-03-16 MED ORDER — LIDOCAINE HCL 1 % IJ SOLN
INTRAMUSCULAR | Status: AC
  Filled 2023-03-16: qty 20

## 2023-03-16 MED ORDER — HYDROMORPHONE HCL 1 MG/ML IJ SOLN
0.5000 mg | INTRAMUSCULAR | Status: DC | PRN
  Administered 2023-03-16: 0.5 mg via INTRAVENOUS
  Filled 2023-03-16 (×2): qty 1

## 2023-03-16 MED ORDER — HYDROMORPHONE HCL 1 MG/ML IJ SOLN: 0.5000 mg | INTRAMUSCULAR | Status: DC | PRN

## 2023-03-16 MED ORDER — ACETAMINOPHEN 500 MG PO TABS
1000.0000 mg | ORAL_TABLET | Freq: Three times a day (TID) | ORAL | Status: DC
  Administered 2023-03-16 – 2023-03-17 (×3): 1000 mg via ORAL
  Filled 2023-03-16 (×3): qty 2

## 2023-03-16 NOTE — Progress Notes (Signed)
PROGRESS NOTE    Jeff [name]  ZOX:[id number] DOB: [birth year] DOA: 03/14/2023 PCP: Simone Curia, MD  Outpatient Specialists: oncology    Brief Narrative:   From admission h and p  Jeff [name] is a 57 y.o. male with medical history significant for metastatic colon cancer, stage IIIa chronic kidney disease, seizure disorder, anxiety, osteoarthritis, depression, GERD, hypertension, dyslipidemia, hypothyroidism and type 2 diabetes mellitus, who presented to the emergency room with acute onset of generalized weakness with associated dyspnea and significantly diminished appetite with failure to thrive.  He denied any fever or chills.  No nausea or vomiting or worsening abdominal pain.  He admits to occasional cough productive of clear sputum with his dyspnea.  No dysuria, hematuria or urinary frequency or urgency or flank pain.  He has been having diminished urine output.  He is on home O2 at 2 L/min.  He was hypotensive and tachycardic in his oncology office before he was sent to the ER.  He has not had any chemotherapy for a while.  He was very lightheaded when he stood up this morning.  No paresthesias or focal muscle weakness.   Assessment & Plan:   Principal Problem:   SIRS (systemic inflammatory response syndrome) (HCC) Active Problems:   AKI (acute kidney injury) (HCC)   Metastatic colon cancer to liver (HCC)   Seizure disorder (HCC)   Dyslipidemia   Hypothyroidism   Essential hypertension   Anxiety and depression   Metabolic acidosis  # SIRS # Hypotension No source identified. Metastatic burden clearly playing a significant role, may be only active process - continue cefepime/flagyl,  -s/p Vanc d/c'd due to renal failure  - monitor cultures - paracentesis as below - continue fluids - pccm consult if hypotension does not respond to fluids  # Ascites Malignant but here with SIRS picture, need to r/o sbp - f/u IR for paracentesis with culture  # Metabolic acidosis #  Encephalopathy 2/2 dehydration, aki, possible infection - abx as above - switch fluids to bicarb   # Hyperkalemia 2/2 AKI. 5. 2 today  - continue fluids - s/p lokelma,  insulin/glucose - trend - maintain tele  # Acute urinary retention Likely 2/2 abdominal mets. Bladder scan showing 650 today and hasn't urinated - place Foley  # End-of-life care # Metastatic colon cancer Scheduled to see Dr. Maryruth Hancock @ [facility] next week to consider clinical trial options but per oncology note today performance status may not allow further treatments. Prognosis is poor, patient appears to be in dying process. Hospice appears appropriate -10/31 wife at bedside, agreed with current treatment and would like palliative/comfort care.   -- palliative consult  # AKI Cr 2.62, baseline around 1, likely prerenal from reduced po - continue fluids for now  # Elevated Dimer Likely from above inflammatory processes - vq scan ordered and is pending  # Anemia Hgb 7.2 today baseline is in the 9s, no report of bleeding.  - monitor, transfuse prn  # Seizure disorder - home keppra  # Hypothyroidism - home synhroid  # T2DM Mild glucose elevation here - semglee qhs - SSI    # PAD - home apixaban on hold 2/2 down-trending hgb     DVT prophylaxis: SCDs Code Status: full Family Communication: daughter updated @ bedside  Level of care: Stepdown Status is: Inpatient Remains inpatient appropriate because: severity of illness    Consultants:  none  Procedures: Paracentesis by IR on 10/31  Antimicrobials:  Vanc/cefepime/flagyl    Subjective Resting  in bed, confused, dozing off, sleepy and having shortness of breath due to ascites  Objective: Vitals:   03/16/23 1000 03/16/23 1100 03/16/23 1200 03/16/23 1300  BP:  129/80 91/61 (!) 95/58  Pulse:      Resp: (!) 26 (!) 31 20 17   Temp: (!) 97.5 F (36.4 C) (!) 97.5 F (36.4 C) (!) 97.3 F (36.3 C) (!) 97.2 F (36.2 C)  TempSrc:    Bladder   SpO2:      Weight:      Height:        Intake/Output Summary (Last 24 hours) at 03/16/2023 1403 Last data filed at 03/16/2023 1056 Gross per 24 hour  Intake 3090.88 ml  Output 680 ml  Net 2410.88 ml   Filed Weights   03/14/23 1610  Weight: 96.2 kg    Examination:  General exam: Appears calm, chronically ill appearing  Respiratory system: Clear to auscultation. Tachypneic Cardiovascular system: S1 & S2 heard, tachycardic, soft systolic murmur Gastrointestinal system: Abdomen is distended, soft and nontender.   Central nervous system: awake, confused, moving all 4 Extremities: warm, no edema Skin: No visible rashes, lesions or ulcers Psychiatry: calm, confused    Data Reviewed: I have personally reviewed following labs and imaging studies  CBC: Recent Labs  Lab 03/14/23 0950 03/14/23 1231 03/15/23 0554 03/16/23 0526  WBC 19.0*  --  19.0* 18.0*  NEUTROABS 15.4*  --   --   --   HGB 8.9* 9.2* 7.3* 7.2*  HCT 28.5* 27.0* 23.9* 23.6*  MCV 87.4  --  90.5 91.8  PLT 168  --  143* 137*   Basic Metabolic Panel: Recent Labs  Lab 03/14/23 0950 03/14/23 1231 03/15/23 0554 03/15/23 1430 03/16/23 0526  NA 129* 133* 130* 133* 132*  K 5.6* 5.5* 5.7* 5.1 5.2*  CL 103  --  107 112* 110  CO2 16*  --  13* 13* 12*  GLUCOSE 212*  --  149* 121* 145*  BUN 56*  --  56* 57* 65*  CREATININE 2.80*  --  2.62* 2.56* 3.01*  CALCIUM 8.4*  --  7.1* 7.0* 6.9*   GFR: Estimated Creatinine Clearance: 31.9 mL/min (A) (by C-G formula based on SCr of 3.01 mg/dL (H)). Liver Function Tests: Recent Labs  Lab 03/14/23 0950  AST 109*  ALT 25  ALKPHOS 345*  BILITOT 0.5  PROT 6.6  ALBUMIN 2.8*   No results for input(s): "LIPASE", "AMYLASE" in the last 168 hours. Recent Labs  Lab 03/14/23 1218  AMMONIA 26   Coagulation Profile: Recent Labs  Lab 03/15/23 0554  INR 2.5*   Cardiac Enzymes: No results for input(s): "CKTOTAL", "CKMB", "CKMBINDEX", "TROPONINI" in the last  168 hours. BNP (last 3 results) No results for input(s): "PROBNP" in the last 8760 hours. HbA1C: Recent Labs    03/14/23 2057 03/14/23 2318  HGBA1C 7.5* 7.2*   CBG: Recent Labs  Lab 03/15/23 1630 03/15/23 2142 03/16/23 0741 03/16/23 1119 03/16/23 1231  GLUCAP 114* 124* 130* 124* 146*   Lipid Profile: No results for input(s): "CHOL", "HDL", "LDLCALC", "TRIG", "CHOLHDL", "LDLDIRECT" in the last 72 hours. Thyroid Function Tests: No results for input(s): "TSH", "T4TOTAL", "FREET4", "T3FREE", "THYROIDAB" in the last 72 hours. Anemia Panel: No results for input(s): "VITAMINB12", "FOLATE", "FERRITIN", "TIBC", "IRON", "RETICCTPCT" in the last 72 hours. Urine analysis:    Component Value Date/Time   COLORURINE YELLOW 03/14/2023 1456   APPEARANCEUR CLEAR 03/14/2023 1456   LABSPEC 1.022 03/14/2023 1456   PHURINE 5.5 03/14/2023 1456  GLUCOSEU >1,000 (A) 03/14/2023 1456   HGBUR NEGATIVE 03/14/2023 1456   BILIRUBINUR NEGATIVE 03/14/2023 1456   KETONESUR NEGATIVE 03/14/2023 1456   PROTEINUR TRACE (A) 03/14/2023 1456   UROBILINOGEN 0.2 01/06/2015 0547   NITRITE NEGATIVE 03/14/2023 1456   LEUKOCYTESUR NEGATIVE 03/14/2023 1456   Sepsis Labs: @LABRCNTIP (procalcitonin:4,lacticidven:4)  ) Recent Results (from the past 240 hour(s))  Body fluid culture w Gram Stain     Status: None   Collection Time: 03/07/23 12:29 PM   Specimen: PATH Cytology Peritoneal fluid  Result Value Ref Range Status   Specimen Description   Final    PERITONEAL Performed at [facility], [address] [place], [place] [address]    Special Requests   Final    NONE Performed at [facility], [address], [place], [place] [address]    Gram Stain NO WBC SEEN NO ORGANISMS SEEN   Final   Culture   Final    NO GROWTH 3 DAYS Performed at [facility], [address] [place], [place] [address]    Report Status 03/10/2023 FINAL  Final  Blood culture (routine  x 2)     Status: None (Preliminary result)   Collection Time: 03/14/23 11:23 AM   Specimen: BLOOD  Result Value Ref Range Status   Specimen Description   Final    BLOOD RIGHT ANTECUBITAL Performed at [facility], [address], [place], [place] [address]    Special Requests   Final    BOTTLES DRAWN AEROBIC AND ANAEROBIC Blood Culture results may not be optimal due to an excessive volume of blood received in culture bottles Performed at [facility], [address] [place], [place] [address]    Culture   Final    NO GROWTH 2 DAYS Performed at [facility], [address], [place], [place] [address]    Report Status PENDING  Incomplete  Blood culture (routine x 2)     Status: None (Preliminary result)   Collection Time: 03/14/23 11:28 AM   Specimen: BLOOD  Result Value Ref Range Status   Specimen Description   Final    BLOOD LEFT ANTECUBITAL Performed at [facility], [address], [place], [place] [address]    Special Requests   Final    BOTTLES DRAWN AEROBIC AND ANAEROBIC Blood Culture adequate volume Performed at [facility], [address] [place] [address], [place], [place] [address]    Culture   Final    NO GROWTH 2 DAYS Performed at [facility], [address] [place], [place] [address]    Report Status PENDING  Incomplete  Urine Culture     Status: None   Collection Time: 03/14/23  2:56 PM   Specimen: Urine, Clean Catch  Result Value Ref Range Status   Specimen Description   Final    URINE, CLEAN CATCH Performed at [facility], [address], [place], [place] [address]    Special Requests   Final    NONE Performed at [facility], [address] [place], [place] [address]    Culture   Final    NO GROWTH Performed at [facility], [address] [place], [place] [address]    Report Status 03/15/2023 FINAL  Final  MRSA Next Gen by  PCR, Nasal     Status: None   Collection Time: 03/14/23  6:17 PM   Specimen: Nasal Mucosa; Nasal Swab  Result Value Ref Range  Status   MRSA by PCR Next Gen NOT DETECTED NOT DETECTED Final    Comment: (NOTE) The GeneXpert MRSA Assay (FDA approved for NASAL specimens only), is one component of a comprehensive MRSA colonization surveillance program. It is not intended to diagnose MRSA infection nor to guide or monitor treatment for MRSA infections. Test performance is not FDA approved in patients less than 45 years old. Performed at [facility], [address], [place], [place] [address]          Radiology Studies: NM Pulmonary Perfusion  Result Date: 03/15/2023 CLINICAL DATA:  Elevated D-dimer level. Extensive and widely metastatic colon cancer in the chest, abdomen, and pelvis. EXAM: NUCLEAR MEDICINE PERFUSION LUNG SCAN TECHNIQUE: Perfusion images were obtained in multiple projections after intravenous injection of radiopharmaceutical. Ventilation scans intentionally deferred if perfusion scan and chest x-ray adequate for interpretation during COVID 19 epidemic. RADIOPHARMACEUTICALS:  4.1 mCi Tc-47m MAA IV COMPARISON:  CT chest 03/14/2023 FINDINGS: Unfortunately the patient was unable to cooperate with imaging, and as a result only anterior and posterior images could be obtained. Bandlike perfusion defect in the left mid lung is present as best appreciated on the posterior images, but also corresponds to confluent tumor in this vicinity on yesterday's chest CT and accordingly is probably best considered a matched defect. As such by PISAPED criteria this is most closely matches the "pulmonary embolism absent" grading. IMPRESSION: 1. Pulmonary embolism absent by PISAPED criteria. Please note that today's exam has reduced diagnostic sensitivity and specificity as the patient was only able to tolerate anterior and posterior imaging, and could not cooperate with any further  imaging. Matched perfusion defect in the left mid chest likely corresponds to the confluent tumor in this vicinity. Electronically Signed   By: Gaylyn Rong M.D.   On: 03/15/2023 14:34        Scheduled Meds:  acetaminophen  1,000 mg Oral Q8H   Chlorhexidine Gluconate Cloth  6 each Topical Q2200   insulin aspart  0-15 Units Subcutaneous TID WC   insulin aspart  0-5 Units Subcutaneous QHS   insulin glargine-yfgn  15 Units Subcutaneous QHS   levETIRAcetam  750 mg Oral BID   levothyroxine  137 mcg Oral QAC breakfast   midodrine  10 mg Oral TID WC   mirtazapine  15 mg Oral QHS   oxyCODONE  10 mg Oral Q12H   pantoprazole  40 mg Oral BID   polyethylene glycol  17 g Oral BID   senna-docusate  1 tablet Oral QHS   venlafaxine XR  225 mg Oral Daily   Continuous Infusions:  ceFEPime (MAXIPIME) IV Stopped (03/16/23 1914)   metronidazole Stopped (03/16/23 7829)     LOS: 2 days   Time spent 35 minutes  Gillis Santa, MD [facility]   If 7PM-7AM, please contact night-coverage [email or web address] Password [email or web address] 03/16/2023, 2:03 PM

## 2023-03-16 NOTE — Plan of Care (Signed)
  Problem: Education: Goal: Knowledge of General Education information will improve Description: Including pain rating scale, medication(s)/side effects and non-pharmacologic comfort measures Outcome: Progressing   Problem: Health Behavior/Discharge Planning: Goal: Ability to manage health-related needs will improve Outcome: Progressing   Problem: Clinical Measurements: Goal: Ability to maintain clinical measurements within normal limits will improve Outcome: Progressing Goal: Will remain free from infection Outcome: Progressing Goal: Diagnostic test results will improve Outcome: Progressing Goal: Respiratory complications will improve Outcome: Progressing Goal: Cardiovascular complication will be avoided Outcome: Progressing   Problem: Activity: Goal: Risk for activity intolerance will decrease Outcome: Progressing   Problem: Nutrition: Goal: Adequate nutrition will be maintained Outcome: Progressing   Problem: Coping: Goal: Level of anxiety will decrease Outcome: Progressing   Problem: Elimination: Goal: Will not experience complications related to bowel motility Outcome: Progressing Goal: Will not experience complications related to urinary retention Outcome: Progressing   Problem: Pain Management: Goal: General experience of comfort will improve Outcome: Progressing   Problem: Safety: Goal: Ability to remain free from injury will improve Outcome: Progressing   Problem: Skin Integrity: Goal: Risk for impaired skin integrity will decrease Outcome: Progressing   Problem: Fluid Volume: Goal: Hemodynamic stability will improve Outcome: Progressing   Problem: Clinical Measurements: Goal: Diagnostic test results will improve Outcome: Progressing Goal: Signs and symptoms of infection will decrease Outcome: Progressing   Problem: Respiratory: Goal: Ability to maintain adequate ventilation will improve Outcome: Progressing   Problem: Education: Goal:  Ability to describe self-care measures that may prevent or decrease complications (Diabetes Survival Skills Education) will improve Outcome: Progressing Goal: Individualized Educational Video(s) Outcome: Progressing   Problem: Coping: Goal: Ability to adjust to condition or change in health will improve Outcome: Progressing   Problem: Fluid Volume: Goal: Ability to maintain a balanced intake and output will improve Outcome: Progressing   Problem: Health Behavior/Discharge Planning: Goal: Ability to identify and utilize available resources and services will improve Outcome: Progressing Goal: Ability to manage health-related needs will improve Outcome: Progressing   Problem: Metabolic: Goal: Ability to maintain appropriate glucose levels will improve Outcome: Progressing   Problem: Nutritional: Goal: Maintenance of adequate nutrition will improve Outcome: Progressing Goal: Progress toward achieving an optimal weight will improve Outcome: Progressing   Problem: Skin Integrity: Goal: Risk for impaired skin integrity will decrease Outcome: Progressing   Problem: Tissue Perfusion: Goal: Adequacy of tissue perfusion will improve Outcome: Progressing

## 2023-03-16 NOTE — TOC Initial Note (Addendum)
Transition of Care [facility]) - Initial/Assessment Note    Patient Details  Name: Jeff [name] MRN: [id number] Date of Birth: [birth year]  [facility] CM/SW Contact:    Darleene Cleaver, LCSW Phone Number: 03/16/2023, 5:46 PM  Clinical Narrative:                  Patient is a 57 year old male who is married and lives with his wife.  Patient is alert and oriented x1.  Patient's family have met with palliative and they have decided they would like to pursue residential hospice.  CSW met with patient and family to provide choice of hospice facilities, they would like to go to [facility].  CSW contacted Cheri in admissions to give the referral, she will review patient and speak to the family.  CSW provided contact information for patient's wife to [place].  TOC to continue to follow to facilitate discharge planning.  Expected Discharge Plan: Hospice Medical Facility Barriers to Discharge: Hospice Bed not available   Patient Goals and CMS Choice Patient states their goals for this hospitalization and ongoing recovery are:: To go to [facility] for end of life care. CMS Medicare.gov Compare Post Acute Care list provided to:: Patient Represenative (must comment) (Patient's wife was at bedside.) Choice offered to / list presented to : Spouse [place] ownership interest in [facility].provided to:: Spouse    Expected Discharge Plan and Services     Post Acute Care Choice: Residential Hospice Bed Living arrangements for the past 2 months: Single Family Home                                      Prior Living Arrangements/Services Living arrangements for the past 2 months: Single Family Home Lives with:: Spouse Patient language and need for interpreter reviewed:: Yes Do you feel safe going back to the place where you live?: No   Patient's family have decided to pursue comfort care and residential hospice.  Need for Family Participation in  Patient Care: Yes (Comment) Care giver support system in place?: No (comment)   Criminal Activity/Legal Involvement Pertinent to Current Situation/Hospitalization: No - Comment as needed  Activities of Daily Living   ADL Screening (condition at time of admission) Independently performs ADLs?: No Does the patient have a NEW difficulty with bathing/dressing/toileting/self-feeding that is expected to last >3 days?: Yes (Initiates electronic notice to provider for possible OT consult) Does the patient have a NEW difficulty with getting in/out of bed, walking, or climbing stairs that is expected to last >3 days?: Yes (Initiates electronic notice to provider for possible PT consult) Does the patient have a NEW difficulty with communication that is expected to last >3 days?: Yes (Initiates electronic notice to provider for possible SLP consult) Is the patient deaf or have difficulty hearing?: No Does the patient have difficulty seeing, even when wearing glasses/contacts?: No Does the patient have difficulty concentrating, remembering, or making decisions?: Yes  Permission Sought/Granted Permission sought to share information with : Case Manager, Magazine features editor, Family Supports Permission granted to share information with : Yes, Release of Information Signed, Yes, Verbal Permission Granted  Share Information with NAME: Sambath, Karm   [phone]  Cheek,Tyrone Brother   [phone]  Elza Rafter Daughter   [phone]  Permission granted to share info w AGENCY: Hospice facility admissions        Emotional  Assessment Appearance:: Appears stated age Attitude/Demeanor/Rapport: Lethargic   Orientation: : Oriented to Self Alcohol / Substance Use: Not Applicable Psych Involvement: No (comment)  Admission diagnosis:  SIRS (systemic inflammatory response syndrome) (HCC) [R65.10] AKI (acute kidney injury) (HCC) [N17.9] Patient Active Problem List   Diagnosis Date Noted    Metabolic acidosis 03/15/2023   AKI (acute kidney injury) (HCC) 03/14/2023   Essential hypertension 03/14/2023   Dyslipidemia 03/14/2023   Hypothyroidism 03/14/2023   Anxiety and depression 03/14/2023   Seizure disorder (HCC) 03/14/2023   Hyponatremia 03/08/2023   Epilepsy (HCC) 03/08/2023   History of CVA (cerebrovascular accident) 03/08/2023   Normocytic anemia 03/08/2023   SIRS (systemic inflammatory response syndrome) (HCC) 03/03/2023   Acute ischemic right MCA stroke (HCC) 10/21/2022   Daytime sleepiness 08/03/2021   OSA (obstructive sleep apnea) 08/03/2021   Body mass index (BMI) 35.0-35.9, adult 01/04/2021   Adhesive capsulitis of left shoulder 06/09/2020   Body mass index (BMI) 25.0-25.9, adult 11/27/2019   DKA (diabetic ketoacidosis) (HCC) 10/25/2019   Seizure (HCC) 10/25/2019   Goals of care, counseling/discussion 09/03/2019   Thoracic spine pain 03/19/2019   DMII (diabetes mellitus, type 2) (HCC) 08/10/2016   Genetic testing 04/20/2016   Port catheter in place 03/14/2016   SBO (small bowel obstruction) (HCC) 01/19/2016   Incisional hernia 01/19/2016   Metastatic colon cancer to liver (HCC) 01/14/2016   Cancer of sigmoid colon (HCC) 12/18/2015   Aftercare following surgery of the circulatory system 09/23/2015   Therapeutic opioid induced constipation 09/23/2015   Left leg claudication (HCC) 09/11/2015   Cervical post-laminectomy syndrome 08/31/2015   Cervical radiculopathy 04/07/2015   Ureterolithiasis 01/04/2015   ED (erectile dysfunction) 12/09/2014   Spasm 07/03/2014   Hypothyroidism following radioiodine therapy 05/26/2014   PAD (peripheral artery disease) (HCC) 02/11/2014   Lumbar facet arthropathy 07/15/2013   Degeneration of lumbar intervertebral disc 06/20/2013   Backache 05/21/2013   Lumbosacral radiculitis 05/21/2013   Chest pain, mid sternal 06/12/2012   Acute non-ST segment elevation myocardial infarction (HCC) 06/11/2012   Hyperlipidemia  06/11/2012   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   Hypertension 03/16/2010   PCP:  Simone Curia, MD Pharmacy:   [facility] [address] [place] - [address] [place] [address] Phone: [phone] Fax: [phone]  [id number] - [place], [place] - [address] AT [facility] [address] [place] [address] Phone: [phone] Fax: [phone]  [id number] [place], [place] - [address] AT [facility] OF [address] [place] [address] Phone: [phone] Fax: [phone]  [facility] [place], [place] - [address] [place] [address] Phone: [phone] Fax: [phone]     Social Determinants of Health (SDOH) Social History: SDOH Screenings   Food Insecurity: No Food Insecurity (03/14/2023)  Housing: Low Risk  (03/14/2023)  Transportation Needs: No Transportation Needs (03/14/2023)  Utilities: Not At Risk (03/14/2023)  Tobacco Use: Low Risk  (03/14/2023)   SDOH Interventions:     Readmission Risk Interventions    03/16/2023    5:30 PM 03/06/2023   10:47 AM  Readmission Risk Prevention Plan  Transportation Screening Complete Complete  PCP or Specialist Appt within 3-5 Days  Complete  HRI or Home Care Consult  Complete  Social Work Consult for Recovery Care Planning/Counseling  Complete  Palliative Care Screening  Not Applicable  Medication Review Oceanographer) Referral to Pharmacy  Complete  PCP or Specialist appointment within 3-5 days of discharge Complete   HRI or Home Care Consult Complete   SW Recovery Care/Counseling Consult Complete   Palliative Care Screening Complete   Skilled Nursing Facility Not Applicable

## 2023-03-16 NOTE — Progress Notes (Signed)
IP PROGRESS NOTE  Subjective:   Jeff [name] is alert this morning.  Jeff [name] is attempting to get out of bed.  Objective: Vital signs in last 24 hours: Blood pressure 100/67, pulse (!) 119, temperature (!) 97 F (36.1 C), resp. rate (!) 23, height 5\' 10"  (1.778 m), weight 212 lb 1.3 oz (96.2 kg), SpO2 99%.  Intake/Output from previous day: 10/30 0701 - 10/31 0700 In: 4361.4 [I.V.:3398.1; IV Piggyback:963.4] Out: 620 [Urine:620]  Physical Exam:  HEENT: No thrush Lungs: Clear, no respiratory distress Cardiac: Regular rate and rhythm Abdomen: Distended with ascites, nontender Extremities: No leg edema Neurologic: Alert, not oriented to place or year, knows my name  Portacath/PICC-without erythema  Lab Results: Recent Labs    03/15/23 0554 03/16/23 0526  WBC 19.0* 18.0*  HGB 7.3* 7.2*  HCT 23.9* 23.6*  PLT 143* 137*    BMET Recent Labs    03/15/23 1430 03/16/23 0526  NA 133* 132*  K 5.1 5.2*  CL 112* 110  CO2 13* 12*  GLUCOSE 121* 145*  BUN 57* 65*  CREATININE 2.56* 3.01*  CALCIUM 7.0* 6.9*    Lab Results  Component Value Date   CEA1 3.19 09/10/2020   CEA 86.38 (H) 02/21/2023    Studies/Results: NM Pulmonary Perfusion  Result Date: 03/15/2023 CLINICAL DATA:  Elevated D-dimer level. Extensive and widely metastatic colon cancer in the chest, abdomen, and pelvis. EXAM: NUCLEAR MEDICINE PERFUSION LUNG SCAN TECHNIQUE: Perfusion images were obtained in multiple projections after intravenous injection of radiopharmaceutical. Ventilation scans intentionally deferred if perfusion scan and chest x-ray adequate for interpretation during COVID 19 epidemic. RADIOPHARMACEUTICALS:  4.1 mCi Tc-80m MAA IV COMPARISON:  CT chest 03/14/2023 FINDINGS: Unfortunately the patient was unable to cooperate with imaging, and as a result only anterior and posterior images could be obtained. Bandlike perfusion defect in the left mid lung is present as best appreciated on the posterior images,  but also corresponds to confluent tumor in this vicinity on yesterday's chest CT and accordingly is probably best considered a matched defect. As such by PISAPED criteria this is most closely matches the "pulmonary embolism absent" grading. IMPRESSION: 1. Pulmonary embolism absent by PISAPED criteria. Please note that today's exam has reduced diagnostic sensitivity and specificity as the patient was only able to tolerate anterior and posterior imaging, and could not cooperate with any further imaging. Matched perfusion defect in the left mid chest likely corresponds to the confluent tumor in this vicinity. Electronically Signed   By: Gaylyn Rong M.D.   On: 03/15/2023 14:34   CT CHEST ABDOMEN PELVIS WO CONTRAST  Result Date: 03/14/2023 CLINICAL DATA:  Sepsis. Shortness of breath, back pain. Hypoxia. History of metastatic colon cancer. EXAM: CT CHEST, ABDOMEN AND PELVIS WITHOUT CONTRAST TECHNIQUE: Multidetector CT imaging of the chest, abdomen and pelvis was performed following the standard protocol without IV contrast. RADIATION DOSE REDUCTION: This exam was performed according to the departmental dose-optimization program which includes automated exposure control, adjustment of the mA and/or kV according to patient size and/or use of iterative reconstruction technique. COMPARISON:  03/03/2023 FINDINGS: CT CHEST FINDINGS Cardiovascular: Heart is normal size. Aorta is normal caliber. Right Port-A-Cath remains in place with the tip in the SVC. Mediastinum/Nodes: Mediastinal and bilateral hilar adenopathy again noted, not significantly changed. No axillary adenopathy. Trachea and esophagus are unremarkable. Thyroid unremarkable. Lungs/Pleura: Bilateral pulmonary nodules and masses compatible with metastatic disease, stable. No acute infiltrates or effusions. Musculoskeletal: Chest wall soft tissues are unremarkable. No acute or suspicious osseous abnormality.  CT ABDOMEN PELVIS FINDINGS Hepatobiliary:  Numerous lesions throughout the liver compatible with metastases, unchanged. Small layering gallstones within the gallbladder. No biliary ductal dilatation. Pancreas: No focal abnormality or ductal dilatation. Spleen: No focal abnormality.  Normal size. Adrenals/Urinary Tract: 4 mm nonobstructing stone in the midpole of the left kidney. Punctate 1-2 mm stone in the midpole of the right kidney. No ureteral stones or hydronephrosis. Small right adrenal nodule, 12 mm, stable. Urinary bladder unremarkable. Stomach/Bowel: Postoperative changes in the sigmoid colon. No bowel obstruction. Stomach and small bowel decompressed, unremarkable. Vascular/Lymphatic: Aortic atherosclerosis. No aneurysm. Portal caval adenopathy is difficult to separate from adjacent structures on this noncontrast CT but visually appears similar to prior study. Other adjacent retroperitoneal lymph nodes such as a retrocaval lymph node on image 68 is stable. Reproductive: No visible focal abnormality. Other: Diffuse omental caking and moderate to large volume ascites, increasing since prior study. Musculoskeletal: Postoperative changes in the lumbar spine. No acute or suspicious bony abnormality. IMPRESSION: Extensive metastatic disease in the chest, abdomen and pelvis not significantly changed since recent study. Omental caking throughout the abdomen and pelvis with increasing ascites. Cholelithiasis.  Bilateral nephrolithiasis. Aortic atherosclerosis. No acute findings. Electronically Signed   By: Charlett Nose M.D.   On: 03/14/2023 13:13   DG Chest Portable 1 View  Result Date: 03/14/2023 CLINICAL DATA:  Shortness of breath. EXAM: PORTABLE CHEST 1 VIEW COMPARISON:  Chest radiograph dated March 06, 2023 FINDINGS: Patient is rotated to the left. The heart size is borderline enlarged. Mediastinal contours are unchanged. Right chest port catheter tip projects over the upper SVC. Known bilateral multifocal pulmonary nodules and masses are again  seen. No new focal consolidation, pneumothorax or pleural effusion. The visualized skeletal structures are unchanged. IMPRESSION: Known pulmonary metastatic disease.  No acute findings in the chest. Electronically Signed   By: Hart Robinsons M.D.   On: 03/14/2023 12:44    Medications: I have reviewed the patient's current medications.  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, biopsy confirmed adenocarcinoma         CTs chest, abdomen, and pelvis on 12/11/2015-indeterminate tiny pulmonary nodules, multiple liver metastases, small nodes in the sigmoid mesocolon Laparoscopic sigmoid colectomy, liver biopsy, Port-A-Cath placement 01/14/2016 Pathology sigmoid colon resection- colonic adenocarcinoma, 5.3 cm extending into pericolonic connective tissue, positive lymph-vascular invasion, positive perineural invasion, negative margins, metastatic carcinoma in 9 of 28 lymph nodes Pathology liver biopsy-metastatic colorectal adenocarcinoma MSI stable; mismatch repair protein normal APC alteration, K RAS wild-type, no BRAF mutation Cycle 1 FOLFOX 02/02/2016 Cycle 2 FOLFOX 02/15/2016 Cycle 3 FOLFOX 02/29/2016 Cycle 4 FOLFOX 03/14/2016 Cycle 5 FOLFOX 03/28/2016 Cycle 6 FOLFOX 04/11/2016 (oxaliplatin held secondary to thrombocytopenia) 04/21/2016 restaging CTs-difficulty evaluating liver lesions due to hepatic steatosis. Stable right adrenal nodule. No adenopathy or local recurrence near the rectosigmoid anastomotic site. Cycle 7 FOLFOX 04/25/2016 MRI liver 05/02/2016-partial improvement in hepatic metastases Cycle 8 FOLFOX 05/10/2016 Cycle 9 FOLFOX 05/23/2016 (oxaliplatin held due to thrombocytopenia) Cycle 10 FOLFOX 06/06/2016 (oxaliplatin dose reduced due to thrombocytopenia) Cycle 11 FOLFOX 06/27/2016 (oxaliplatin held due to neuropathy) Cycle 12 FOLFOX 07/11/2016 (oxaliplatin held) Initiation of maintenance Xeloda 7 days on/7 days  off 07/27/2016 MRI liver 11/18/2016-decrease in hepatic metastatic disease. No new or progressive disease identified within the abdomen. Continuation of Xeloda 7 days on/7 days off MRI liver 04/27/2017-previous liver lesions not identified, no new lesions, no lymphadenopathy Xeloda continued 7 days on/7 days off MRI liver 12/04/2017 - no evidence of metastatic  disease, hepatic steatosis Xeloda continued 7 days on/7 days off MRI liver 07/15/2018- no evidence of metastatic disease.  Stable severe hepatic steatosis. Xeloda continued 7 days on/7 days off MRI liver 03/16/2019-hepatic steatosis, no liver mass, focal area of intrahepatic biliary dilatation in segments 2 and 3 of the left lobe-increased Xeloda continued 7 days on/7 days off MRI abdomen 08/19/2019-no findings to suggest liver metastases.  Bilateral lung nodules measuring up to 2.8 cm, progressive and more conspicuous than on previous exam CT chest 08/29/2019-multiple pulmonary metastases, new from 04/21/2016 Cycle 1 FOLFIRI/bevacizumab 09/09/2019 Cycle 2 FOLFIRI/bevacizumab 09/26/2019  Cycle 3 FOLFIRI/bevacizumab 10/10/2019 Cycle 4 FOLFIRI/bevacizumab 10/24/2019 Cycle 5 FOLFIRI/bevacizumab 11/07/2019 CT chest 11/14/2019-decreased size of lung nodules, no new lesions, hepatic steatosis Cycle 6 FOLFIRI/bevacizumab 11/21/2019 Cycle 7 FOLFIRI/bevacizumab 12/05/2019 Cycle 8 FOLFIRI/bevacizumab 12/19/2019 Cycle 9 FOLFIRI/bevacizumab 01/02/2020 Cycle 10 FOLFIRI/bevacizumab 01/16/2020 CT chest 01/29/2020-stable bilateral pulmonary metastases.  No new or progressive metastatic disease in the chest. Cycle 11 FOLFIRI/bevacizumab 01/30/2020 Cycle 12 FOLFIRI/bevacizumab 02/19/2020 Cycle 13 FOLFIRI/bevacizumab 03/12/2020 Cycle 14 FOLFIRI/bevacizumab 04/02/2020 Cycle 15 FOLFIRI/bevacizumab 04/23/2020 Cycle 16 FOLFIRI/bevacizumab 05/21/2020 CT chest 06/09/2020-mild progression pulmonary metastasis.  Some lesions have increased in size while others are similar. Cycle 1  irinotecan/Panitumumab 06/18/2020 Cycle 2 irinotecan/Panitumumab 07/02/2020 Cycle 3 irinotecan/panitumumab 07/16/2020 Cycle 4 irinotecan/Panitumumab 07/30/2020 Cycle 5 irinotecan/Panitumumab 08/13/2020, Emend added Cycle 6 irinotecan/Panitumumab 08/27/2020 CT chest 09/08/2020-decreased size of pulmonary nodules, no progressive disease Cycle 7 irinotecan/panitumumab 09/02/2020 Cycle 8 irinotecan/panitumumab 09/24/2020 Cycle 9 irinotecan/Panitumumab 10/08/2020 10/22/2020 treatment held due to left foot fracture, need for surgery Cycle 10 irinotecan/panitumumab 11/24/2020 Cycle 11 irinotecan/Panitumumab 12/08/2020 Cycle 12 irinotecan/panitumumab 12/22/2020 Cycle 13 irinotecan/panitumumab 01/05/2021 01/20/2021 CT chest-mixed response with minimal increase in size of some lesions and minimal decrease in the size of other lesions.  Overall number of lesions is unchanged. Cycle 14 irinotecan/Panitumumab 02/05/2021 Cycle 15 irinotecan/Panitumumab 02/23/2021 Cycle 16 irinotecan/panitumumab 03/16/2021 Cycle 17 irinotecan/Panitumumab 04/13/2021 05/03/2021-CT chest-enlargement of pulmonary metastases, no new lesions 06/21/2021-cycle 1 Lonsurf/bevacizumab 07/19/2021-cycle 2 Lonsurf/bevacizumab 08/16/2021-cycle 3 Lonsurf/bevacizumab 09/09/2021 CT chest-bilateral lung nodules and masses with mixed response, overall stable to very minimally increased 09/13/2021 cycle 4 Lonsurf/bevacizumab 10/12/2021 cycle 5 Lonsurf/bevacizumab 10/21/2021 chest CT-bilateral pulmonary metastases without significant change.  No new or progressive disease within the chest. 11/08/2021 cycle 6 Lonsurf/bevacizumab 12/06/2021 cycle 7 Lonsurf/bevacizumab 01/03/2022 cycle 8 Lonsurf/bevacizumab CTs 01/25/2022-index lung lesion stable to mildly increased in size.  Mild retroperitoneal adenopathy, mildly increased in size compared to 12/16/2020. 02/01/2022 cycle 9 Lonsurf/bevacizumab 02/28/2022 cycle 10 Lonsurf/bevacizumab 03/28/2022 cycle 11 Lonsurf/bevacizumab CTs  04/21/2022-no change in bilateral pulmonary metastases, 2 new hypoattenuating liver lesions, new enlarged portacaval node Cycle 1 fruquintinib 05/24/2022 Cycle 2 fruquintinib 06/21/2022 Cycle 3 fruquintinib 07/19/2022 CTs 08/12/2022-stable lung nodules, hilar and retroperitoneal nodes, slight increase in size of liver metastases, stable right adrenal nodule Cycle 4 fruquintinib 08/16/2022 Cycle 5 fruquintinib 09/13/2022 Cycle 6 fruquintinib 10/11/2022 CTs 10/22/2022-progressive mediastinal, pericardial, and periportal adenopathy, enlargement of liver lesions, stable pulmonary nodules Cycle 1 FOLFOX 11/14/2022 Cycle 2 FOLFOX 11/29/2022, oxaliplatin dose reduced due to thrombocytopenia Cycle 3 FOLFOX 12/27/2022 Cycle 4 FOLFOX 01/10/2023 Cycle 5 FOLFOX 01/24/2023 Cycle 6 FOLFOX 02/07/2023 CTs 02/14/2023-progressive metastases in the lungs, liver, and chest/retroperitoneal lymph nodes and new peritoneal soft tissue nodules CT chest 03/03/2023-negative for pulmonary embolism, progression of metastatic lymphadenopathy and pleural masses, new trace right pleural effusion, small pericardial effusion CT abdomen/pelvis 03/03/2023-progressive metastatic disease in the liver and abdominal lymph nodes, development of omental caking and new small to moderate volume ascites 03/07/2023-paracentesis, cytology positive for adenocarcinoma CTs 03/14/2023-no change in extensive  metastatic disease in the lungs, liver, and lymph nodes.  Omental caking with increased ascites   2.   Rectal bleeding and constipation secondary to #1   3.   History of peripheral vascular disease, status post left lower extremity vascular bypass surgery in April 2017   4.   History of nephrolithiasis   5.   History of Graves' disease treated with radioactive iodine   6.   Anxiety/depression   7.   Hypertension   8.   Hospitalization 01/19/2016 with wound dehiscence status post secondary suture closure of abdominal wall   9.   Thrombocytopenia  secondary to chemotherapy-oxaliplatin held with cycle 6 and cycle 9 FOLFOX   10. Hyperglycemia 06/20/2016-diagnosed with diabetes, maintained on insulin   11.  Positive COVID test 12/13/2018; positive COVID test 01/25/2021   12.  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  Admission 03/03/2023 with nausea, fever, and tachycardia 14.  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  Admission 03/14/2023 with altered mental status, hypotension, tachycardia, renal failure and hypoxia V/q. scan 03/15/2023-no evidence of pulmonary embolism, limited study   Jeff [name] was admitted with altered mental status, tachycardia, hypotension, and renal failure.  No source for infection has been identified and cultures are negative to date.  Jeff [name] was maintained on anticoagulation therapy prior to hospital admission and a V/q. scan does not reveal evidence of pulmonary embolism.  I suspect the clinical presentation is related to the metastatic tumor burden.  Jeff [name] has extensive metastatic disease involving the lungs, liver, and abdominal cavity.  Jeff [name] has persistent/progressive renal failure.  Jeff [name] may have hepatic encephalopathy despite the normal ammonia level, and may be developing hepatorenal syndrome.  Jeff [name] has a poor prognosis.  I recommend hospice care.  I discussed the case with his wife by telephone this morning.  She understands the poor prognosis.  I estimate his lifespan to be measured in days to weeks.  She agrees with hospice care.  Jeff [name] appears to be a candidate for residential hospice.  They live in [place].  Recommendations: 1.  Continue antibiotics today, follow-up cultures 2.  Low volume palliative paracentesis for symptomatic ascites 3.  Refer to [facility] to consider transfer to the [facility] 4.  If cultures remain negative through today, I recommend consolidating the medical regimen for comfort  LOS: 2 days    Thornton Papas, MD   03/16/2023, 7:52 AM

## 2023-03-16 NOTE — Evaluation (Signed)
SLP Cancellation Note  Patient Details Name: Jeff [name] MRN: [id number] DOB: [birth year]   Cancelled treatment:       Reason Eval/Treat Not Completed: Other (comment) (Per review of oncology notes, hospice is advised and pt with prognosis of days to weeks; will sign off at this time)  Rolena Infante, MS [facility] SLP Acute Rehab Services Office [phone]   Chales Abrahams 03/16/2023, 8:07 AM

## 2023-03-16 NOTE — Procedures (Signed)
PROCEDURE SUMMARY:  Successful US guided paracentesis from left abdomen. Yielded of 3.5 L blood-tinged fluid.  No immediate complications.  Pt tolerated well.   Specimen sent for labs.  EBL < 2 mL  Mickie Kay, NP 03/16/2023 2:16 PM

## 2023-03-16 NOTE — Consult Note (Signed)
Consultation Note Date: 03/16/2023   Patient Name: Jeff [name]  DOB: 10-22-1965  MRN: [id number]  Age / Sex: 57 y.o., male  PCP: Simone Curia, MD Referring Physician: Gillis Santa, MD  Reason for Consultation: Establishing goals of care  HPI/Patient Profile: 57 y.o. male admitted on 03/14/2023    57 year old gentleman who [place] his [place] in [place].  Patient sees Dr. Truett Perna from medical oncology.  Patient has life limiting illness of metastatic colon cancer.  He has extensive metastatic disease burden involving lungs liver and abdominal cavity.  He has been admitted to hospital medicine service with persistent/progressive renal failure, hepatic encephalopathy.  He remains admitted to hospital medicine service, he is being followed closely by medical oncology CODE STATUS has been revised to DNR/DNI Consideration is being given to addition of hospice services-potential transfer to residential hospice facility towards the end of this hospitalization Palliative consult for ongoing goals of care discussions Chart reviewed, patient seen and examined, discussed with the patient and wife was also present at bedside Palliative medicine is specialized medical care for people living with serious illness. It focuses on providing relief from the symptoms and stress of a serious illness. The goal is to improve quality of life for both the patient and the family. Goals of care: Broad aims of medical therapy in relation to the patient's values and preferences. Our aim is to provide medical care aimed at enabling patients to achieve the goals that matter most to them, given the circumstances of their particular medical situation and their constraints.   Clinical Assessment and Goals of Care: Goals of care discussions undertaken.     NEXT OF KIN  Wife, grand son.   SUMMARY OF RECOMMENDATIONS     Explained about hospice philosophy of care, specifically the type of care that is provided inside a residential hospice setting.  Patient complains of back discomfort and generalized restlessness.  Also complains of abdominal fullness, has ascites, is due to undergo paracentesis on 03-16-2023. Will place [facility] consult for exploring residential hospice, patient is from [facility] [facility] facility has been chosen. In light of patient's worsening renal failure, opioid rotate from morphine to hydromorphone IV and monitor, continue scheduled OxyContin and monitor Agree with DNR/DNI, continue current mode of care, anticipate transition to full scope of comfort measures and transfer to residential hospice towards the end of this hospitalization. Thank you for the consult. Code Status/Advance Care Planning: DNR   Symptom Management:     Palliative Prophylaxis:  Delirium Protocol    Psycho-social/Spiritual:  Desire for further Chaplaincy support:yes Additional Recommendations: Education on Hospice  Prognosis:  < 2 weeks  Discharge Planning: Hospice facility      Primary Diagnoses: Present on Admission:  Metastatic colon cancer to liver (HCC)  SIRS (systemic inflammatory response syndrome) (HCC)   I have reviewed the medical record, interviewed the patient and family, and examined the patient. The following aspects are pertinent.  Past Medical History:  Diagnosis Date   Allergic rhinitis  Anxiety    Arthritis    Chemotherapy-induced thrombocytopenia    Chronic nausea    mild intermittant and loose stools due to colon cancer   Chronic neck and back pain    Depression    ED (erectile dysfunction)    Generalized idiopathic epilepsy and epileptic syndromes, not intractable, without status epilepticus (HCC) 10/25/2019   (11-10-2020 per pt wife last seizure 11/ 2021)  neurologist-- dr Karel Jarvis---  seizure activity/ DKA ;  EEG w/ evidence epileto genicity  arising right frontocentral region;  taking keppra   GERD (gastroesophageal reflux disease)    Hiatal hernia    History of 2019 novel coronavirus disease (COVID-19) 12/13/2018   per pt wife , pt had moderate symtptoms that resolved   History of Graves' disease    s/p RAI 02/ 2012;  previous seen by endocrinologist--- dr Everardo All   History of kidney stones    History of non-ST elevation myocardial infarction (NSTEMI)    Jan 2014--  no CAD;  per notes probable coronary vasospasm   History of panic attacks    History of thyroid storm    Nov 2011 w/ grave's ,  s/p RAI 02/ 2012   Hyperlipidemia    Hypertension    Hypothyroidism following radioiodine therapy    RAI in 02/  2012---  previously followed by dr Everardo All,  now followed by pcp   Lower urinary tract symptoms (LUTS)    Metastatic colon cancer to liver (HCC) 11/2015   oncologist-- [facility]/ and seen by dr a. Maisie Fus;  01-14-2016 s/p sigmoid colectomy, liver bx, node dissection, PAC;   chemo started 09/ 2017  ;  04/ 2021 metastatic pulmonary nodules;   active treatment every 2 wks   Nephrolithiasis    left side non-obstructive per imaging 04/ 2022   PAD (peripheral artery disease) [facility]) cardiologist-  dr Kirke Corin   cardiologist ----  dr Judie Petit. Kirke Corin ;   left popliteal occlusion behind knee w/ collaterals;  s/p attempted angioplasty 01/ 2016;  09-11-2015 s/p pop-TPT w/ ipsNR GSV   PONV (postoperative nausea and vomiting)    Rash    due to chemo   Type 2 diabetes mellitus treated with insulin (HCC)    followed by pcp----   (11-10-2020  per pt wife blood sugar check's 2-4 times daily,  fasting sugar--- 130--168)   Unspecified fracture of left foot, initial encounter for closed fracture 10/17/2020   midfoot   Social History   Socioeconomic History   Marital status: Married    Spouse name: Not on file   Number of children: 5   Years of education: Not on file   Highest education level: Not on file  Occupational History   Occupation:  Disabled    Employer: UNEMPLOYED  Tobacco Use   Smoking status: Never   Smokeless tobacco: Never  Vaping Use   Vaping status: Never Used  Substance and Sexual Activity   Alcohol use: Yes    Comment: RARE   Drug use: Never   Sexual activity: Not on file  Other Topics Concern   Not on file  Social History Narrative   Married - wife works Immunologist unit @ [employer]   5 daughters   Regular exercise-no   Disabled to due back problem   12/04/2015   Right handed    Social Determinants of Health   Financial Resource Strain: Not on file  Food Insecurity: No Food Insecurity (03/14/2023)   Hunger Vital Sign    Worried About Running Out of  Food in the Last Year: Never true    Ran Out of Food in the Last Year: Never true  Transportation Needs: No Transportation Needs (03/14/2023)   PRAPARE - Administrator, Civil Service (Medical): No    Lack of Transportation (Non-Medical): No  Physical Activity: Not on file  Stress: Not on file  Social Connections: Not on file   Family History  Problem Relation Age of Onset   Thyroid disease Mother        hypothyroidism   Heart attack Maternal Grandfather    Heart Problems Father        pacermaker   Edema Father    Heart disease Maternal Grandmother    Lung cancer Maternal Grandmother    Diabetes Maternal Grandmother    Hypertension Maternal Grandmother    Scheduled Meds:  acetaminophen  1,000 mg Oral Q8H   Chlorhexidine Gluconate Cloth  6 each Topical Q2200   insulin aspart  0-15 Units Subcutaneous TID WC   insulin aspart  0-5 Units Subcutaneous QHS   insulin glargine-yfgn  15 Units Subcutaneous QHS   levETIRAcetam  750 mg Oral BID   levothyroxine  137 mcg Oral QAC breakfast   midodrine  10 mg Oral TID WC   mirtazapine  15 mg Oral QHS   oxyCODONE  10 mg Oral Q12H   pantoprazole  40 mg Oral BID   polyethylene glycol  17 g Oral BID   senna-docusate  1 tablet Oral QHS   venlafaxine XR  225 mg Oral Daily   Continuous  Infusions:  ceFEPime (MAXIPIME) IV Stopped (03/16/23 5366)   metronidazole Stopped (03/16/23 4403)   PRN Meds:.benzonatate, HYDROmorphone (DILAUDID) injection, LORazepam, magnesium hydroxide, meclizine, nitroGLYCERIN, ondansetron **OR** ondansetron (ZOFRAN) IV, mouth rinse, tiZANidine, traZODone Medications Prior to Admission:  Prior to Admission medications   Medication Sig Start Date End Date Taking? Authorizing Provider  apixaban (ELIQUIS) 5 MG TABS tablet Take 1 tablet (5 mg total) by mouth 2 (two) times daily. 11/18/22  Yes Iran Ouch, MD  atorvastatin (LIPITOR) 80 MG tablet TAKE 1 TABLET BY MOUTH AT BEDTIME 03/07/23  Yes Arida, Muhammad A, MD  benzonatate (TESSALON) 100 MG capsule TAKE 1 CAPSULE BY MOUTH 2 TIMES DAILY AS NEEDED FOR COUGH. Patient taking differently: Take 100 mg by mouth 2 (two) times daily as needed for cough. 02/23/23  Yes Ladene Artist, MD  CLARITIN-D 24 HOUR 10-240 MG 24 hr tablet Take 1 tablet by mouth daily.   Yes [provider]  dapagliflozin propanediol (FARXIGA) 10 MG TABS tablet Take 10 mg by mouth daily.   Yes [provider]  levETIRAcetam (KEPPRA) 750 MG tablet Take 1 tablet twice a day Patient taking differently: Take 750 mg by mouth 2 (two) times daily. 11/15/22  Yes Van Clines, MD  levothyroxine (SYNTHROID) 137 MCG tablet Take 137 mcg by mouth daily before breakfast. 06/01/22  Yes [provider]  lidocaine-prilocaine (EMLA) cream Apply 1 Application topically as needed. Apply to portacath site 1 hour prior to use Patient taking differently: Apply 1 Application topically as needed (for port access- 1 hour prior to portacath use). 10/11/22  Yes Rana Snare, NP  LORazepam (ATIVAN) 0.5 MG tablet Take 1 tablet (0.5 mg total) by mouth every 8 (eight) hours as needed for anxiety. 12/27/22  Yes Rana Snare, NP  meclizine (ANTIVERT) 25 MG tablet Take 25 mg by mouth 3 (three) times daily as needed for dizziness. 02/25/16  Yes  [provider]  metFORMIN (GLUCOPHAGE-XR) 500 MG 24 hr tablet Take 1,000 mg by mouth in the morning and at bedtime. 10/30/16  Yes [provider]  metoprolol tartrate (LOPRESSOR) 100 MG tablet Take 100 mg by mouth 2 (two) times daily.   Yes [provider]  mirtazapine (REMERON) 15 MG tablet Take 1 tablet (15 mg total) by mouth at bedtime. 02/07/23  Yes Rana Snare, NP  NOVOLOG FLEXPEN 100 UNIT/ML FlexPen Inject 18 Units into the skin 3 (three) times daily with meals as needed for high blood sugar.   Yes [provider]  ondansetron (ZOFRAN) 8 MG tablet Take 1 tablet (8 mg total) by mouth every 8 (eight) hours as needed for nausea or vomiting. 03/28/22  Yes Rana Snare, NP  oxyCODONE ER [facility] ER) 13.5 MG C12A Take 13.5 mg by mouth every 12 (twelve) hours. 10/19/21  Yes [provider]  oxyCODONE-acetaminophen (PERCOCET) 10-325 MG tablet Take 1 tablet by mouth every 6 (six) hours as needed for pain. 12/02/20  Yes [provider]  pantoprazole (PROTONIX) 40 MG tablet Take 40 mg by mouth 2 (two) times daily. 10/23/15  Yes [provider]  polyethylene glycol (MIRALAX / GLYCOLAX) 17 g packet Take 17 g by mouth 2 (two) times daily. 03/06/23  Yes Jerald Kief, MD  potassium chloride SA (KLOR-CON M) 20 MEQ tablet TAKE 1 TABLET BY MOUTH DAILY. 12/09/22  Yes Ladene Artist, MD  promethazine (PHENERGAN) 12.5 MG tablet Take 1 tablet (12.5 mg total) by mouth every 6 (six) hours as needed. Patient taking differently: Take 12.5 mg by mouth every 6 (six) hours as needed for nausea or vomiting. 12/27/22  Yes Rana Snare, NP  senna-docusate (SENOKOT-S) 8.6-50 MG tablet Take 1 tablet by mouth at bedtime. 03/06/23  Yes Jerald Kief, MD  tamsulosin (FLOMAX) 0.4 MG CAPS capsule Take 0.4 mg by mouth 2 (two) times daily.   Yes [provider]  tiZANidine (ZANAFLEX) 4 MG tablet Take 4 mg by mouth every 8 (eight) hours as needed for muscle  spasms. 07/22/21  Yes [provider]  TRESIBA FLEXTOUCH 200 UNIT/ML FlexTouch Pen Inject 80 Units into the skin at bedtime.   Yes [provider]  venlafaxine XR (EFFEXOR-XR) 75 MG 24 hr capsule Take 225 mg by mouth daily. 09/09/19  Yes [provider]  ACCU-CHEK GUIDE test strip CHECK BLOOD SUGAR THREE TIMES DAILY AS DIRECTED 08/14/20   [provider]  ACCU-CHEK SOFTCLIX LANCETS lancets USE  TID TO CHECK BLOOD SUGAR LEVELS 06/22/16   [provider]  diphenoxylate-atropine (LOMOTIL) 2.5-0.025 MG tablet Take 1-2 tablets by mouth 4 (four) times daily as needed for diarrhea or loose stools. Patient not taking: Reported on 03/03/2023 06/06/22   Rana Snare, NP  GLOBAL EASE INJECT PEN NEEDLES 31G X 5 MM MISC Inject 1 Syringe into the skin 4 (four) times daily. 09/18/20   [provider]  nitroGLYCERIN (NITROSTAT) 0.4 MG SL tablet DISSOLVE 1 TABLET UNDER TONGUE EVERY 5 MINUTES AS NEEDED FOR CHEST PAIN. IF NO RELIEF AFTER 3 DOSES CALL 911. Patient taking differently: Place 0.4 mg under the tongue every 5 (five) minutes x 3 doses as needed for chest pain (CALL 9-1-1, IF NO RELIEF). 06/16/21   Iran Ouch, MD   Allergies  Allergen Reactions   Hydrocodone Hives   Review of Systems +back pain  Physical Exam Abdomen distended with ascites No lower extremity edema Complains of back pain and Awake alert  sitting by the edge of the bed Regular work of breathing Monitor noted Alert, knows he is in the hospital, able to identify wife in the room  Vital Signs: BP (!) 95/58   Pulse (!) 119   Temp (!) 97.2 F (36.2 C)   Resp 17   Ht 5\' 10"  (1.778 m)   Wt 96.2 kg   SpO2 99%   BMI 30.43 kg/m  Pain Scale: 0-10   Pain Score: 0-No pain   SpO2: SpO2: 99 % O2 Device:SpO2: 99 % O2 Flow Rate: .O2 Flow Rate (L/min): 3 L/min  IO: Intake/output summary:  Intake/Output Summary (Last 24 hours) at 03/16/2023 1402 Last data filed at 03/16/2023  1056 Gross per 24 hour  Intake 3090.88 ml  Output 680 ml  Net 2410.88 ml    LBM: Last BM Date : 03/14/23 Baseline Weight: Weight: 96.2 kg Most recent weight: Weight: 96.2 kg     Palliative Assessment/Data:   PPS 30%  Time In:  1320 Time Out:    1420 Time Total:  60  Greater than 50%  of this time was spent counseling and coordinating care related to the above assessment and plan.  Signed by: Rosalin Hawking, MD   Please contact Palliative Medicine Team phone at [phone] for questions and concerns.  For individual provider: See Loretha Stapler

## 2023-03-17 ENCOUNTER — Ambulatory Visit: Payer: Medicaid Other | Admitting: Family Medicine

## 2023-03-17 DIAGNOSIS — R000 Tachycardia, unspecified: Secondary | ICD-10-CM

## 2023-03-17 DIAGNOSIS — R6510 Systemic inflammatory response syndrome (SIRS) of non-infectious origin without acute organ dysfunction: Secondary | ICD-10-CM | POA: Diagnosis not present

## 2023-03-17 DIAGNOSIS — R4182 Altered mental status, unspecified: Secondary | ICD-10-CM

## 2023-03-17 DIAGNOSIS — Z515 Encounter for palliative care: Secondary | ICD-10-CM

## 2023-03-17 DIAGNOSIS — I950 Idiopathic hypotension: Secondary | ICD-10-CM

## 2023-03-17 DIAGNOSIS — C786 Secondary malignant neoplasm of retroperitoneum and peritoneum: Secondary | ICD-10-CM

## 2023-03-17 DIAGNOSIS — C7800 Secondary malignant neoplasm of unspecified lung: Secondary | ICD-10-CM

## 2023-03-17 LAB — CBC
HCT: 24.4 % — ABNORMAL LOW (ref 39.0–52.0)
Hemoglobin: 7.3 g/dL — ABNORMAL LOW (ref 13.0–17.0)
MCH: 27.4 pg (ref 26.0–34.0)
MCHC: 29.9 g/dL — ABNORMAL LOW (ref 30.0–36.0)
MCV: 91.7 fL (ref 80.0–100.0)
Platelets: 124 10*3/uL — ABNORMAL LOW (ref 150–400)
RBC: 2.66 MIL/uL — ABNORMAL LOW (ref 4.22–5.81)
RDW: 19.5 % — ABNORMAL HIGH (ref 11.5–15.5)
WBC: 16.1 10*3/uL — ABNORMAL HIGH (ref 4.0–10.5)
nRBC: 0.4 % — ABNORMAL HIGH (ref 0.0–0.2)

## 2023-03-17 LAB — BASIC METABOLIC PANEL
Anion gap: 14 (ref 5–15)
BUN: 71 mg/dL — ABNORMAL HIGH (ref 6–20)
CO2: 13 mmol/L — ABNORMAL LOW (ref 22–32)
Calcium: 7 mg/dL — ABNORMAL LOW (ref 8.9–10.3)
Chloride: 107 mmol/L (ref 98–111)
Creatinine, Ser: 3.44 mg/dL — ABNORMAL HIGH (ref 0.61–1.24)
GFR, Estimated: 20 mL/min — ABNORMAL LOW (ref >60)
Glucose, Bld: 126 mg/dL — ABNORMAL HIGH (ref 70–99)
Potassium: 4.7 mmol/L (ref 3.5–5.1)
Sodium: 134 mmol/L — ABNORMAL LOW (ref 135–145)

## 2023-03-17 LAB — GLUCOSE, CAPILLARY
Glucose-Capillary: 112 mg/dL — ABNORMAL HIGH (ref 70–99)
Glucose-Capillary: 127 mg/dL — ABNORMAL HIGH (ref 70–99)

## 2023-03-17 MED ORDER — SODIUM CHLORIDE 0.9 % IV SOLN: 2.0000 g | INTRAVENOUS | Status: DC

## 2023-03-17 NOTE — Discharge Summary (Signed)
Physician Discharge Summary  Jeff [name] YNW:[id number] DOB: [birth year] DOA: 03/14/2023  PCP: Simone Curia, MD  Admit date: 03/14/2023 Discharge date: 03/17/2023 Recommendations for Outpatient Follow-up:  Follow up hospice facility  Discharge Dispo: Inpatient hospice Discharge Condition: Stable Code Status:   Code Status: Limited: Do not attempt resuscitation (DNR) -DNR-LIMITED -Do Not Intubate/DNI  Diet recommendation:  Diet Order             Diet heart healthy/carb modified Room service appropriate? Yes; Fluid consistency: Thin  Diet effective now                    Brief/Interim Summary: 57 yom w/ metastatic colon cancer, CKD3a,seizure disorder, anxiety, osteoarthritis, depression, GERD, hypertension, dyslipidemia, hypothyroidism and T2DM who presented to the ED w/ acute onset of generalized weakness with associated dyspnea and significantly diminished appetite with failure to thrive,occasional cough productive and dyspnea,having diminished urine output. He was hypotensive and tachycardic in his oncology office before he was sent to the ER.He has not had any chemotherapy for a while. Patient admitted with altered mental status hypertension tachycardia AKI on CKD and failure to thrive. Patient continues to be hypotensive, with worsening renal function. After subsequent family meeting with palliative care comfort care being pursued at hospice facility and bed available for 03/17/2023      Discharge Diagnoses:  Principal Problem:   SIRS (systemic inflammatory response syndrome) (HCC) Active Problems:   AKI (acute kidney injury) (HCC)   Metastatic colon cancer to liver (HCC)   Seizure disorder (HCC)   Dyslipidemia   Hypothyroidism   Essential hypertension   Anxiety and depression   Metabolic acidosis  End-of-life care Metastatic colon cancer: Chest abdomen pelvis 10/29 with extensive metastatic disease in chest abdomen pelvis omental caking throughout and increasing  ascites. patient was at home with hospice, palliative care following.  Seen by oncology, patient continues to do poorly, at high risk of deconstruction prognosis is very poor he has been DNR/DNI.  Overnight patient had been hypotensive Labs shows creatinine continues to worsen further 3.4 BUN 71, leukocytosis persistent, with anemia thrombocytopeni, he is hypotensive, continues to have worsening renal failure. palliative care following plan is for transition to comfort care towards the end of the hospitalization.  SIRS Hypotension Tachycardia: Malignant ascites: Patient remains hypotensive, low temp and tachycardic.  Has significant tumor burden likely cause for his SIRS, so far blood culture urine culture no evidence of infection. S/p palliative paracentesis 10/31.  Peritoneal fluid Gram stain no organism,red color with, total nucleated cells 2556 neutrophil 81%. Managed on IV antibiotics while in hospital   AKI on CKD3A Metabolic acidosis Hyperkalemia: Creatinine continues to worsen.  Elevated D-dimer: No metastatic disease VQ scan negative  Acute urine retention: In the setting of abdominal meds, Foley in place  Anemia of malignancy: Hb 7-8 gm  Thrombocytopenia: Likely in the setting of malignancy.  Monitor  Seizure disorder: Continue Keppra  Hypothyroidism:  Type 2 diabetes:  PAD: Eliquis on hold due to anemia  Consults: Palliative care Oncology Subjective: Alert awake, weak, frail and ill looking  Discharge Exam: Vitals:   03/17/23 1000 03/17/23 1100  BP:    Pulse:    Resp: 19 (!) 21  Temp: 98.1 F (36.7 C) 97.7 F (36.5 C)  SpO2:     General: Pt is alert, awake, not in acute distress Cardiovascular: RRR, S1/S2 +, no rubs, no gallops Respiratory: CTA bilaterally, no wheezing, no rhonchi Abdominal: Soft, NT, ND, bowel sounds + Extremities: no edema,  no cyanosis  Discharge Instructions   Allergies as of 03/17/2023       Reactions   Hydrocodone Hives         Medication List     STOP taking these medications    Accu-Chek Guide test strip Generic drug: glucose blood   Accu-Chek Softclix Lancets lancets   apixaban 5 MG Tabs tablet Commonly known as: ELIQUIS   atorvastatin 80 MG tablet Commonly known as: LIPITOR   benzonatate 100 MG capsule Commonly known as: TESSALON   Claritin-D 24 Hour 10-240 MG 24 hr tablet Generic drug: loratadine-pseudoephedrine   dapagliflozin propanediol 10 MG Tabs tablet Commonly known as: FARXIGA   diphenoxylate-atropine 2.5-0.025 MG tablet Commonly known as: LOMOTIL   Global Ease Inject Pen Needles 31G X 5 MM Misc Generic drug: Insulin Pen Needle   lidocaine-prilocaine cream Commonly known as: EMLA   meclizine 25 MG tablet Commonly known as: ANTIVERT   metFORMIN 500 MG 24 hr tablet Commonly known as: GLUCOPHAGE-XR   metoprolol tartrate 100 MG tablet Commonly known as: LOPRESSOR   NovoLOG FlexPen 100 UNIT/ML FlexPen Generic drug: insulin aspart   pantoprazole 40 MG tablet Commonly known as: PROTONIX   polyethylene glycol 17 g packet Commonly known as: MIRALAX / GLYCOLAX   potassium chloride SA 20 MEQ tablet Commonly known as: KLOR-CON M   promethazine 12.5 MG tablet Commonly known as: PHENERGAN   senna-docusate 8.6-50 MG tablet Commonly known as: Senokot-S   tamsulosin 0.4 MG Caps capsule Commonly known as: FLOMAX   tiZANidine 4 MG tablet Commonly known as: ZANAFLEX   Tresiba FlexTouch 200 UNIT/ML FlexTouch Pen Generic drug: insulin degludec       TAKE these medications    levETIRAcetam 750 MG tablet Commonly known as: KEPPRA Take 1 tablet twice a day What changed:  how much to take how to take this when to take this additional instructions   levothyroxine 137 MCG tablet Commonly known as: SYNTHROID Take 137 mcg by mouth daily before breakfast.   LORazepam 0.5 MG tablet Commonly known as: ATIVAN Take 1 tablet (0.5 mg total) by mouth every 8  (eight) hours as needed for anxiety.   mirtazapine 15 MG tablet Commonly known as: REMERON Take 1 tablet (15 mg total) by mouth at bedtime.   nitroGLYCERIN 0.4 MG SL tablet Commonly known as: NITROSTAT DISSOLVE 1 TABLET UNDER TONGUE EVERY 5 MINUTES AS NEEDED FOR CHEST PAIN. IF NO RELIEF AFTER 3 DOSES CALL 911. What changed:  how much to take how to take this when to take this reasons to take this additional instructions   ondansetron 8 MG tablet Commonly known as: ZOFRAN Take 1 tablet (8 mg total) by mouth every 8 (eight) hours as needed for nausea or vomiting.   oxyCODONE-acetaminophen 10-325 MG tablet Commonly known as: PERCOCET Take 1 tablet by mouth every 6 (six) hours as needed for pain.   venlafaxine XR 75 MG 24 hr capsule Commonly known as: EFFEXOR-XR Take 225 mg by mouth daily.   Xtampza ER 13.5 MG C12a Generic drug: oxyCODONE ER Take 13.5 mg by mouth every 12 (twelve) hours.        Follow-up Information     Simone Curia, MD Follow up in 1 week(s).   Specialty: Internal Medicine Contact information: [address] [place] [address] [phone]                Allergies  Allergen Reactions   Hydrocodone Hives    The results of significant  diagnostics from this hospitalization (including imaging, microbiology, ancillary and laboratory) are listed below for reference.    Microbiology: Recent Results (from the past 240 hour(s))  Body fluid culture w Gram Stain     Status: None   Collection Time: 03/07/23 12:29 PM   Specimen: PATH Cytology Peritoneal fluid  Result Value Ref Range Status   Specimen Description   Final    PERITONEAL Performed at [facility], [address], [place], [place] [address]    Special Requests   Final    NONE Performed at [facility], [address], [place], [place] [address]    Gram Stain NO WBC SEEN NO ORGANISMS SEEN   Final   Culture   Final    NO GROWTH 3  DAYS Performed at [facility], [address], [place], [place] [address]    Report Status 03/10/2023 FINAL  Final  Blood culture (routine x 2)     Status: None (Preliminary result)   Collection Time: 03/14/23 11:23 AM   Specimen: BLOOD  Result Value Ref Range Status   Specimen Description   Final    BLOOD RIGHT ANTECUBITAL Performed at [facility], [address], [place], [place] [address]    Special Requests   Final    BOTTLES DRAWN AEROBIC AND ANAEROBIC Blood Culture results may not be optimal due to an excessive volume of blood received in culture bottles Performed at [facility], [address] [place], [place] [address]    Culture   Final    NO GROWTH 3 DAYS Performed at [facility], [address] [place], [place] [address]    Report Status PENDING  Incomplete  Blood culture (routine x 2)     Status: None (Preliminary result)   Collection Time: 03/14/23 11:28 AM   Specimen: BLOOD  Result Value Ref Range Status   Specimen Description   Final    BLOOD LEFT ANTECUBITAL Performed at [facility], [address] [place], [place] [address]    Special Requests   Final    BOTTLES DRAWN AEROBIC AND ANAEROBIC Blood Culture adequate volume Performed at [facility], [address], [place], [place] [address]    Culture   Final    NO GROWTH 3 DAYS Performed at [facility], [address], [place], [place] [address]    Report Status PENDING  Incomplete  Urine Culture     Status: None   Collection Time: 03/14/23  2:56 PM   Specimen: Urine, Clean Catch  Result Value Ref Range Status   Specimen Description   Final    URINE, CLEAN CATCH Performed at [facility], [address], [place], [place] [address]    Special Requests   Final    NONE Performed at [facility], [address], [place], [place] [address]    Culture   Final     NO GROWTH Performed at [facility], [address], [place], [place] [address]    Report Status 03/15/2023 FINAL  Final  MRSA Next Gen by PCR, Nasal     Status: None   Collection Time: 03/14/23  6:17 PM   Specimen: Nasal Mucosa; Nasal Swab  Result Value Ref Range Status   MRSA by PCR Next Gen NOT DETECTED NOT DETECTED Final    Comment: (NOTE) The GeneXpert MRSA Assay (FDA approved for NASAL specimens only), is one component of a comprehensive MRSA colonization  surveillance program. It is not intended to diagnose MRSA infection nor to guide or monitor treatment for MRSA infections. Test performance is not FDA approved in patients less than 72 years old. Performed at [facility], [address], [place], [place] [address]   Body fluid culture w Gram Stain     Status: None (Preliminary result)   Collection Time: 03/16/23 11:48 AM   Specimen: PATH Cytology Peritoneal fluid  Result Value Ref Range Status   Specimen Description   Final    PERITONEAL Performed at [facility], [address] [place], [place] [address]    Special Requests   Final    NONE Performed at [facility], [address], [place], [place] [address]    Gram Stain NO WBC SEEN NO ORGANISMS SEEN   Final   Culture   Final    NO GROWTH < 24 HOURS Performed at [facility], [address], [place], [place] [address]    Report Status PENDING  Incomplete    Procedures/Studies: US Paracentesis  Result Date: 03/16/2023 INDICATION: Patient with a history of metastatic colon cancer with recurrent ascites. Interventional radiology asked to perform a diagnostic and therapeutic paracentesis. EXAM: ULTRASOUND GUIDED PARACENTESIS MEDICATIONS: 1% lidocaine 10 mL COMPLICATIONS: None immediate. PROCEDURE: Informed written consent was obtained from the patient after a discussion of the risks, benefits and alternatives to treatment. A timeout was performed  prior to the initiation of the procedure. Initial ultrasound scanning demonstrates a large amount of ascites within the left lower abdominal quadrant. The left lower abdomen was prepped and draped in the usual sterile fashion. 1% lidocaine was used for local anesthesia. Following this, a 19 gauge, 7-cm, Yueh catheter was introduced. An ultrasound image was saved for documentation purposes. The paracentesis was performed. The catheter was removed and a dressing was applied. The patient tolerated the procedure well without immediate post procedural complication. FINDINGS: A total of approximately 3.5 L of blood-tinged fluid was removed. Samples were sent to the laboratory as requested by the clinical team. IMPRESSION: Successful ultrasound-guided paracentesis yielding 3.5 liters of peritoneal fluid. Procedure performed by Alwyn Ren NP Electronically Signed   By: Simonne Come M.D.   On: 03/16/2023 14:54   NM Pulmonary Perfusion  Result Date: 03/15/2023 CLINICAL DATA:  Elevated D-dimer level. Extensive and widely metastatic colon cancer in the chest, abdomen, and pelvis. EXAM: NUCLEAR MEDICINE PERFUSION LUNG SCAN TECHNIQUE: Perfusion images were obtained in multiple projections after intravenous injection of radiopharmaceutical. Ventilation scans intentionally deferred if perfusion scan and chest x-ray adequate for interpretation during COVID 19 epidemic. RADIOPHARMACEUTICALS:  4.1 mCi Tc-77m MAA IV COMPARISON:  CT chest 03/14/2023 FINDINGS: Unfortunately the patient was unable to cooperate with imaging, and as a result only anterior and posterior images could be obtained. Bandlike perfusion defect in the left mid lung is present as best appreciated on the posterior images, but also corresponds to confluent tumor in this vicinity on yesterday's chest CT and accordingly is probably best considered a matched defect. As such by PISAPED criteria this is most closely matches the "pulmonary embolism absent" grading.  IMPRESSION: 1. Pulmonary embolism absent by PISAPED criteria. Please note that today's exam has reduced diagnostic sensitivity and specificity as the patient was only able to tolerate anterior and posterior imaging, and could not cooperate with any further imaging. Matched perfusion defect in the left mid chest likely corresponds to the confluent tumor in this vicinity. Electronically Signed   By: Zollie Beckers  Ova Freshwater M.D.   On: 03/15/2023 14:34   CT CHEST ABDOMEN PELVIS WO CONTRAST  Result Date: 03/14/2023 CLINICAL DATA:  Sepsis. Shortness of breath, back pain. Hypoxia. History of metastatic colon cancer. EXAM: CT CHEST, ABDOMEN AND PELVIS WITHOUT CONTRAST TECHNIQUE: Multidetector CT imaging of the chest, abdomen and pelvis was performed following the standard protocol without IV contrast. RADIATION DOSE REDUCTION: This exam was performed according to the departmental dose-optimization program which includes automated exposure control, adjustment of the mA and/or kV according to patient size and/or use of iterative reconstruction technique. COMPARISON:  03/03/2023 FINDINGS: CT CHEST FINDINGS Cardiovascular: Heart is normal size. Aorta is normal caliber. Right Port-A-Cath remains in place with the tip in the SVC. Mediastinum/Nodes: Mediastinal and bilateral hilar adenopathy again noted, not significantly changed. No axillary adenopathy. Trachea and esophagus are unremarkable. Thyroid unremarkable. Lungs/Pleura: Bilateral pulmonary nodules and masses compatible with metastatic disease, stable. No acute infiltrates or effusions. Musculoskeletal: Chest wall soft tissues are unremarkable. No acute or suspicious osseous abnormality. CT ABDOMEN PELVIS FINDINGS Hepatobiliary: Numerous lesions throughout the liver compatible with metastases, unchanged. Small layering gallstones within the gallbladder. No biliary ductal dilatation. Pancreas: No focal abnormality or ductal dilatation. Spleen: No focal abnormality.  Normal  size. Adrenals/Urinary Tract: 4 mm nonobstructing stone in the midpole of the left kidney. Punctate 1-2 mm stone in the midpole of the right kidney. No ureteral stones or hydronephrosis. Small right adrenal nodule, 12 mm, stable. Urinary bladder unremarkable. Stomach/Bowel: Postoperative changes in the sigmoid colon. No bowel obstruction. Stomach and small bowel decompressed, unremarkable. Vascular/Lymphatic: Aortic atherosclerosis. No aneurysm. Portal caval adenopathy is difficult to separate from adjacent structures on this noncontrast CT but visually appears similar to prior study. Other adjacent retroperitoneal lymph nodes such as a retrocaval lymph node on image 68 is stable. Reproductive: No visible focal abnormality. Other: Diffuse omental caking and moderate to large volume ascites, increasing since prior study. Musculoskeletal: Postoperative changes in the lumbar spine. No acute or suspicious bony abnormality. IMPRESSION: Extensive metastatic disease in the chest, abdomen and pelvis not significantly changed since recent study. Omental caking throughout the abdomen and pelvis with increasing ascites. Cholelithiasis.  Bilateral nephrolithiasis. Aortic atherosclerosis. No acute findings. Electronically Signed   By: Charlett Nose M.D.   On: 03/14/2023 13:13   DG Chest Portable 1 View  Result Date: 03/14/2023 CLINICAL DATA:  Shortness of breath. EXAM: PORTABLE CHEST 1 VIEW COMPARISON:  Chest radiograph dated March 06, 2023 FINDINGS: Patient is rotated to the left. The heart size is borderline enlarged. Mediastinal contours are unchanged. Right chest port catheter tip projects over the upper SVC. Known bilateral multifocal pulmonary nodules and masses are again seen. No new focal consolidation, pneumothorax or pleural effusion. The visualized skeletal structures are unchanged. IMPRESSION: Known pulmonary metastatic disease.  No acute findings in the chest. Electronically Signed   By: Hart Robinsons M.D.    On: 03/14/2023 12:44   IR Paracentesis  Result Date: 03/10/2023 INDICATION: 57 year old male. History of metastatic colon cancer with recurrent ascites. Request is for therapeutic paracentesis EXAM: ULTRASOUND GUIDED THERAPEUTIC PARACENTESIS MEDICATIONS: Lidocaine 1% 10 mL COMPLICATIONS: None immediate. PROCEDURE: Informed written consent was obtained from the patient after a discussion of the risks, benefits and alternatives to treatment. A timeout was performed prior to the initiation of the procedure. Initial ultrasound scanning demonstrates a moderate amount of ascites within the left lower abdominal quadrant. The lung lower abdomen was prepped and draped in the usual sterile fashion. 1% lidocaine was used for local anesthesia.  Following this, a 19 gauge, 7-cm, Yueh catheter was introduced. An ultrasound image was saved for documentation purposes. The paracentesis was performed. The catheter was removed and a dressing was applied. The patient tolerated the procedure well without immediate post procedural complication. FINDINGS: A total of approximately 4.2 L of serosanguineous fluid was removed. IMPRESSION: Successful ultrasound-guided therapeutic left-sided paracentesis yielding 4.2 liters of peritoneal fluid. Performed by Anders Grant NP Electronically Signed   By: Acquanetta Belling M.D.   On: 03/10/2023 15:53   US Paracentesis  Result Date: 03/07/2023 INDICATION: The patient has a history of metastatic colon adenocarcinoma. Oncology requested a therapeutic and diagnostic paracentesis for ascites EXAM: ULTRASOUND GUIDE   DIAGNOSTIC AND THERAPEUTIC PARACENTESIS MEDICATIONS: None. COMPLICATIONS: None immediate. PROCEDURE: Informed written consent was obtained from the patient after a discussion of the risks, benefits and alternatives to treatment. A timeout was performed prior to the initiation of the procedure. Initial ultrasound scanning demonstrates a large amount of ascites within the LEFT lower  abdominal quadrant. The LEFT lower abdomen was prepped and draped in the usual sterile fashion. 1% lidocaine was used for local anesthesia. Following this, a 19 gauge, 10-cm, Yueh catheter was introduced. An ultrasound image was saved for documentation purposes. The paracentesis was performed. The catheter was removed and a dressing was applied. The patient tolerated the procedure well without immediate post procedural complication. FINDINGS: A total of approximately 3.9 Liters of bloody fluid was removed. Samples were sent to the laboratory as requested by the clinical team. IMPRESSION: Successful ultrasound-guided DIAGNOSTIC AND THERAPEUTIC paracentesis yielding 3.9 liters liters of peritoneal fluid. Performed by Ardith Dark, NP-C Electronically Signed   By: Irish Lack M.D.   On: 03/07/2023 12:30   DG CHEST PORT 1 VIEW  Result Date: 03/06/2023 CLINICAL DATA:  Shortness of breath and hypoxia EXAM: PORTABLE CHEST 1 VIEW COMPARISON:  03/03/2023 FINDINGS: Patchy nodular densities are identified bilaterally consistent with metastatic disease. The overall appearance is similar to that seen 3 days previous. No new focal abnormality is noted. Right chest wall port is again seen. IMPRESSION: Stable pulmonary metastatic disease. Electronically Signed   By: Alcide Clever M.D.   On: 03/06/2023 22:08   CT ABDOMEN PELVIS W CONTRAST  Result Date: 03/03/2023 CLINICAL DATA:  Bowel obstruction suspected Patient with known history of metastatic colon cancer. EXAM: CT ABDOMEN AND PELVIS WITH CONTRAST TECHNIQUE: Multidetector CT imaging of the abdomen and pelvis was performed using the standard protocol following bolus administration of intravenous contrast. RADIATION DOSE REDUCTION: This exam was performed according to the departmental dose-optimization program which includes automated exposure control, adjustment of the mA and/or kV according to patient size and/or use of iterative reconstruction technique. CONTRAST:   75mL OMNIPAQUE IOHEXOL 350 MG/ML SOLN COMPARISON:  CT 02/14/2023 FINDINGS: Lower chest: Assessed on concurrent chest CT, reported separately. Hepatobiliary: Again seen known hepatic metastatic disease. Masses in the left lobe have coalesced from prior exam with slight increase in size, exact measurements difficult due to the infiltrative nature. Representative right lobe lesion measuring 2.9 x 2.1 cm series 4, image 22, previously measured 2.5 x 2.1 cm. There is no obvious portal vein invasion or thrombus. Small gallstones within partially distended gallbladder. No biliary dilatation. Small amount of perihepatic ascites is new. Pancreas: No ductal dilatation or inflammation. Spleen: Normal in size without focal abnormality. Small amount of perisplenic ascites, new. Adrenals/Urinary Tract: Exophytic 12 mm right adrenal nodule versus adjacent lymph node is grossly stable. Normal left adrenal gland. Punctate right and 5 mm  nonobstructing left renal calculi. No hydronephrosis. There is no perinephric inflammation. Symmetric excretion on delayed phase imaging. Unremarkable urinary bladder. Stomach/Bowel: Lack of enteric contrast limits detailed bowel assessment. Nondistended stomach. No small bowel dilatation or evidence of obstruction. Enteric sutures in the sigmoid. Small to moderate volume of stool in colon. There is no evidence of colonic obstruction or inflammation. Vascular/Lymphatic: Aortic atherosclerosis. No aneurysm. Again seen multifocal adenopathy, progressed. Representative portal caval node measures 4.5 x 2.6 cm, series 4, image 35, previously 4.2 x 2.2 retrocaval nodal conglomerate measures 4.2 x 2.2 cm series 4, image 41, previously 3.5 x 1.8 cm. Cm. Reproductive: Prominent prostate again seen. Other: Significant progression in peritoneal nodularity an wispy soft tissue density in the anterior omentum. There previous 10 mm soft tissue nodule is now obscured by diffuse omental soft tissue density in  thickening. Small to moderate volume ascites in the abdomen and pelvis is new. Musculoskeletal: Posterior lumbar fusion hardware. No evidence of focal bone lesion or acute osseous findings. IMPRESSION: 1. No bowel obstruction. 2. Known metastatic disease with progression. This includes development of diffuse omental caking and new small to moderate abdominopelvic ascites over the last 17 days. Progression in known hepatic and nodal metastatic disease. 3. Gallstones and nonobstructive renal calculi. Aortic Atherosclerosis (ICD10-I70.0). Electronically Signed   By: Narda Rutherford M.D.   On: 03/03/2023 19:15   CT Angio Chest PE W and/or Wo Contrast  Result Date: 03/03/2023 CLINICAL DATA:  Pulmonary embolism (PE) suspected, high prob Shortness of breath. Review of radiologic records indicates history of metastatic colon cancer. EXAM: CT ANGIOGRAPHY CHEST WITH CONTRAST TECHNIQUE: Multidetector CT imaging of the chest was performed using the standard protocol during bolus administration of intravenous contrast. Multiplanar CT image reconstructions and MIPs were obtained to evaluate the vascular anatomy. RADIATION DOSE REDUCTION: This exam was performed according to the departmental dose-optimization program which includes automated exposure control, adjustment of the mA and/or kV according to patient size and/or use of iterative reconstruction technique. CONTRAST:  75mL OMNIPAQUE IOHEXOL 350 MG/ML SOLN COMPARISON:  Radiograph earlier today.  Chest CT 02/14/2023 FINDINGS: Cardiovascular: There are no filling defects within the pulmonary arteries to suggest pulmonary embolus. Mediastinal adenopathy causes mass effect on the right middle lobar pulmonary artery but no filling defects. Borderline cardiomegaly. Small pericardial effusion. No acute aortic findings. Right chest port in place. Mediastinum/Nodes: Multifocal mediastinal and hilar adenopathy, with progression from prior exam. For example, subcarinal node  measures 2.5 cm short axis series 2, image 62, previously 2.2 cm. Right hilar node measures 1.8 cm short axis series 2, image 59, previously 1.6 cm. Right anterior pericardial node measures 4.1 x 2.5 cm, series 2, image 93, previously 4 x 2.5 cm. There is enlargement of multiple additional mediastinal and hilar nodes. No esophageal wall thickening. Lungs/Pleura: Pulmonary metastatic disease again seen, with progression from prior. Many of the previous nodules and masses have now coalesced making exact measurement difficult. This includes a right middle lobe masslike conglomerate measuring 4.1 x 5.8 cm, series 10, image 66, previously 4 x 5.1 cm on my remeasurement. Left lower lobe mass measures 3.4 x 2.9 cm, series 10, image 64, previously 3.4 x 2 6 cm. Many of the smaller nodules have also increased in size. There is a trace right pleural effusion, new. Upper Abdomen: Assessed on concurrent abdominopelvic CT, reported separately. Musculoskeletal: No acute findings or evidence of osseous metastatic disease. Review of the MIP images confirms the above findings. IMPRESSION: 1. No pulmonary embolus. 2. Known intrathoracic  metastatic disease with progression over the last 17 days. Enlargement of thoracic lymph nodes and pulmonary metastasis. 3. Trace right pleural effusion, new. Small pericardial effusion. Electronically Signed   By: Narda Rutherford M.D.   On: 03/03/2023 19:07   DG Chest 2 View  Result Date: 03/03/2023 CLINICAL DATA:  Shortness of breath. EXAM: CHEST - 2 VIEW COMPARISON:  06/08/2022. FINDINGS: There are at least 3 lung masses with largest in the middle lobe measuring up to 2.8 x 4.8 cm, compatible with metastasis. There is interval increase in the size when compared to the prior radiograph from 06/08/2022. Bilateral lung fields are otherwise clear. No acute consolidation or lung collapse. Bilateral costophrenic angles are clear. Stable cardio-mediastinal silhouette. No acute osseous abnormalities.  The soft tissues are within normal limits. Redemonstration of right-sided CT Port-A-Cath with its tip overlying the lower portion of superior vena cava, unchanged. IMPRESSION: 1. Interval increase in size of pulmonary metastasis. 2. No acute cardiopulmonary abnormality. Electronically Signed   By: Jules Schick M.D.   On: 03/03/2023 16:31    Labs: BNP (last 3 results) Recent Labs    03/14/23 1112  BNP 41.5   Basic Metabolic Panel: Recent Labs  Lab 03/14/23 0950 03/14/23 1231 03/15/23 0554 03/15/23 1430 03/16/23 0526 03/17/23 0454  NA 129* 133* 130* 133* 132* 134*  K 5.6* 5.5* 5.7* 5.1 5.2* 4.7  CL 103  --  107 112* 110 107  CO2 16*  --  13* 13* 12* 13*  GLUCOSE 212*  --  149* 121* 145* 126*  BUN 56*  --  56* 57* 65* 71*  CREATININE 2.80*  --  2.62* 2.56* 3.01* 3.44*  CALCIUM 8.4*  --  7.1* 7.0* 6.9* 7.0*   Liver Function Tests: Recent Labs  Lab 03/14/23 0950  AST 109*  ALT 25  ALKPHOS 345*  BILITOT 0.5  PROT 6.6  ALBUMIN 2.8*   No results for input(s): "LIPASE", "AMYLASE" in the last 168 hours. Recent Labs  Lab 03/14/23 1218  AMMONIA 26   CBC: Recent Labs  Lab 03/14/23 0950 03/14/23 1231 03/15/23 0554 03/16/23 0526 03/17/23 0454  WBC 19.0*  --  19.0* 18.0* 16.1*  NEUTROABS 15.4*  --   --   --   --   HGB 8.9* 9.2* 7.3* 7.2* 7.3*  HCT 28.5* 27.0* 23.9* 23.6* 24.4*  MCV 87.4  --  90.5 91.8 91.7  PLT 168  --  143* 137* 124*   Cardiac Enzymes: No results for input(s): "CKTOTAL", "CKMB", "CKMBINDEX", "TROPONINI" in the last 168 hours. BNP: Invalid input(s): "POCBNP" CBG: Recent Labs  Lab 03/15/23 2142 03/16/23 0741 03/16/23 1119 03/16/23 1231 03/17/23 0805  GLUCAP 124* 130* 124* 146* 112*   D-Dimer No results for input(s): "DDIMER" in the last 72 hours. Hgb A1c Recent Labs    03/14/23 2057 03/14/23 2318  HGBA1C 7.5* 7.2*   Lipid Profile No results for input(s): "CHOL", "HDL", "LDLCALC", "TRIG", "CHOLHDL", "LDLDIRECT" in the last 72  hours. Thyroid function studies No results for input(s): "TSH", "T4TOTAL", "T3FREE", "THYROIDAB" in the last 72 hours.  Invalid input(s): "FREET3" Anemia work up No results for input(s): "VITAMINB12", "FOLATE", "FERRITIN", "TIBC", "IRON", "RETICCTPCT" in the last 72 hours. Urinalysis    Component Value Date/Time   COLORURINE YELLOW 03/14/2023 1456   APPEARANCEUR CLEAR 03/14/2023 1456   LABSPEC 1.022 03/14/2023 1456   PHURINE 5.5 03/14/2023 1456   GLUCOSEU >1,000 (A) 03/14/2023 1456   HGBUR NEGATIVE 03/14/2023 1456   BILIRUBINUR NEGATIVE 03/14/2023 1456  KETONESUR NEGATIVE 03/14/2023 1456   PROTEINUR TRACE (A) 03/14/2023 1456   UROBILINOGEN 0.2 01/06/2015 0547   NITRITE NEGATIVE 03/14/2023 1456   LEUKOCYTESUR NEGATIVE 03/14/2023 1456   Sepsis Labs Recent Labs  Lab 03/14/23 0950 03/15/23 0554 03/16/23 0526 03/17/23 0454  WBC 19.0* 19.0* 18.0* 16.1*   Microbiology Recent Results (from the past 240 hour(s))  Body fluid culture w Gram Stain     Status: None   Collection Time: 03/07/23 12:29 PM   Specimen: PATH Cytology Peritoneal fluid  Result Value Ref Range Status   Specimen Description   Final    PERITONEAL Performed at [facility], [address], [place], [place] [address]    Special Requests   Final    NONE Performed at [facility], [address], [place], [place] [address]    Gram Stain NO WBC SEEN NO ORGANISMS SEEN   Final   Culture   Final    NO GROWTH 3 DAYS Performed at [facility], [address] [place], [place] [address]    Report Status 03/10/2023 FINAL  Final  Blood culture (routine x 2)     Status: None (Preliminary result)   Collection Time: 03/14/23 11:23 AM   Specimen: BLOOD  Result Value Ref Range Status   Specimen Description   Final    BLOOD RIGHT ANTECUBITAL Performed at [facility], [address], [place], [place] [address]    Special Requests   Final    BOTTLES  DRAWN AEROBIC AND ANAEROBIC Blood Culture results may not be optimal due to an excessive volume of blood received in culture bottles Performed at [facility], [address] [place], [place] [address]    Culture   Final    NO GROWTH 3 DAYS Performed at [facility], [address], [place], [place] [address]    Report Status PENDING  Incomplete  Blood culture (routine x 2)     Status: None (Preliminary result)   Collection Time: 03/14/23 11:28 AM   Specimen: BLOOD  Result Value Ref Range Status   Specimen Description   Final    BLOOD LEFT ANTECUBITAL Performed at [facility], [address] [place], [place] [address]    Special Requests   Final    BOTTLES DRAWN AEROBIC AND ANAEROBIC Blood Culture adequate volume Performed at [facility], [address], [place], [place] [address]    Culture   Final    NO GROWTH 3 DAYS Performed at [facility], [address] [place], [place] [address]    Report Status PENDING  Incomplete  Urine Culture     Status: None   Collection Time: 03/14/23  2:56 PM   Specimen: Urine, Clean Catch  Result Value Ref Range Status   Specimen Description   Final    URINE, CLEAN CATCH Performed at [facility], [address] [place], [place] [address]    Special Requests   Final    NONE Performed at [facility], [address] [place], [place] [address]    Culture   Final    NO GROWTH Performed at [facility], [address], [place], [place] [address]    Report Status 03/15/2023 FINAL  Final  MRSA Next Gen by PCR, Nasal     Status: None   Collection Time: 03/14/23  6:17 PM   Specimen: Nasal Mucosa; Nasal Swab  Result Value Ref Range Status  MRSA by PCR Next Gen NOT DETECTED NOT DETECTED Final    Comment: (NOTE) The GeneXpert MRSA Assay (FDA approved for NASAL specimens only), is one component of a comprehensive MRSA  colonization surveillance program. It is not intended to diagnose MRSA infection nor to guide or monitor treatment for MRSA infections. Test performance is not FDA approved in patients less than 58 years old. Performed at [facility], [address], [place], [place] [address]   Body fluid culture w Gram Stain     Status: None (Preliminary result)   Collection Time: 03/16/23 11:48 AM   Specimen: PATH Cytology Peritoneal fluid  Result Value Ref Range Status   Specimen Description   Final    PERITONEAL Performed at [facility], [address] [place], [place] [address]    Special Requests   Final    NONE Performed at [facility], [address] [place], [place] [address]    Gram Stain NO WBC SEEN NO ORGANISMS SEEN   Final   Culture   Final    NO GROWTH < 24 HOURS Performed at [facility], [address], [place], [place] [address]    Report Status PENDING  Incomplete   Time coordinating discharge: 35 minutes.  SIGNED: Lanae Boast, MD  [facility] 03/17/2023, 11:54 AM  If 7PM-7AM, please contact night-coverage [email or web address]

## 2023-03-17 NOTE — Progress Notes (Signed)
IP PROGRESS NOTE  Subjective:   [name] is alert this morning.  He denies pain.  He appears confused.  Objective: Vital signs in last 24 hours: Blood pressure (!) 81/43, pulse (!) 119, temperature (!) 97.5 F (36.4 C), resp. rate 15, height 5\' 10"  (1.778 m), weight 212 lb 1.3 oz (96.2 kg), SpO2 99%.  Intake/Output from previous day: 10/31 0701 - 11/01 0700 In: 1250.6 [I.V.:955.3; IV Piggyback:295.3] Out: 460 [Urine:460]  Physical Exam:  HEENT: No thrush Lungs: Clear anteriorly, no respiratory distress Cardiac: Regular rate and rhythm Abdomen: Less distended, nontender Extremities: No leg edema Neurologic: Alert, not oriented, moves extremities to command  Portacath/PICC-without erythema  Lab Results: Recent Labs    03/16/23 0526 03/17/23 0454  WBC 18.0* 16.1*  HGB 7.2* 7.3*  HCT 23.6* 24.4*  PLT 137* 124*    BMET Recent Labs    03/16/23 0526 03/17/23 0454  NA 132* 134*  K 5.2* 4.7  CL 110 107  CO2 12* 13*  GLUCOSE 145* 126*  BUN 65* 71*  CREATININE 3.01* 3.44*  CALCIUM 6.9* 7.0*    Lab Results  Component Value Date   CEA1 3.19 09/10/2020   CEA 86.38 (H) 02/21/2023    Studies/Results: US Paracentesis  Result Date: 03/16/2023 INDICATION: Patient with a history of metastatic colon cancer with recurrent ascites. Interventional radiology asked to perform a diagnostic and therapeutic paracentesis. EXAM: ULTRASOUND GUIDED PARACENTESIS MEDICATIONS: 1% lidocaine 10 mL COMPLICATIONS: None immediate. PROCEDURE: Informed written consent was obtained from the patient after a discussion of the risks, benefits and alternatives to treatment. A timeout was performed prior to the initiation of the procedure. Initial ultrasound scanning demonstrates a large amount of ascites within the left lower abdominal quadrant. The left lower abdomen was prepped and draped in the usual sterile fashion. 1% lidocaine was used for local anesthesia. Following this, a 19 gauge, 7-cm,  Yueh catheter was introduced. An ultrasound image was saved for documentation purposes. The paracentesis was performed. The catheter was removed and a dressing was applied. The patient tolerated the procedure well without immediate post procedural complication. FINDINGS: A total of approximately 3.5 L of blood-tinged fluid was removed. Samples were sent to the laboratory as requested by the clinical team. IMPRESSION: Successful ultrasound-guided paracentesis yielding 3.5 liters of peritoneal fluid. Procedure performed by Alwyn Ren NP Electronically Signed   By: Simonne Come M.D.   On: 03/16/2023 14:54   NM Pulmonary Perfusion  Result Date: 03/15/2023 CLINICAL DATA:  Elevated D-dimer level. Extensive and widely metastatic colon cancer in the chest, abdomen, and pelvis. EXAM: NUCLEAR MEDICINE PERFUSION LUNG SCAN TECHNIQUE: Perfusion images were obtained in multiple projections after intravenous injection of radiopharmaceutical. Ventilation scans intentionally deferred if perfusion scan and chest x-ray adequate for interpretation during COVID 19 epidemic. RADIOPHARMACEUTICALS:  4.1 mCi Tc-7m MAA IV COMPARISON:  CT chest 03/14/2023 FINDINGS: Unfortunately the patient was unable to cooperate with imaging, and as a result only anterior and posterior images could be obtained. Bandlike perfusion defect in the left mid lung is present as best appreciated on the posterior images, but also corresponds to confluent tumor in this vicinity on yesterday's chest CT and accordingly is probably best considered a matched defect. As such by PISAPED criteria this is most closely matches the "pulmonary embolism absent" grading. IMPRESSION: 1. Pulmonary embolism absent by PISAPED criteria. Please note that today's exam has reduced diagnostic sensitivity and specificity as the patient was only able to tolerate anterior and posterior imaging, and could not cooperate  with any further imaging. Matched perfusion defect in the left  mid chest likely corresponds to the confluent tumor in this vicinity. Electronically Signed   By: Gaylyn Rong M.D.   On: 03/15/2023 14:34    Medications: I have reviewed the patient's current medications.  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, biopsy confirmed adenocarcinoma         CTs chest, abdomen, and pelvis on 12/11/2015-indeterminate tiny pulmonary nodules, multiple liver metastases, small nodes in the sigmoid mesocolon Laparoscopic sigmoid colectomy, liver biopsy, Port-A-Cath placement 01/14/2016 Pathology sigmoid colon resection- colonic adenocarcinoma, 5.3 cm extending into pericolonic connective tissue, positive lymph-vascular invasion, positive perineural invasion, negative margins, metastatic carcinoma in 9 of 28 lymph nodes Pathology liver biopsy-metastatic colorectal adenocarcinoma MSI stable; mismatch repair protein normal APC alteration, K RAS wild-type, no BRAF mutation Cycle 1 FOLFOX 02/02/2016 Cycle 2 FOLFOX 02/15/2016 Cycle 3 FOLFOX 02/29/2016 Cycle 4 FOLFOX 03/14/2016 Cycle 5 FOLFOX 03/28/2016 Cycle 6 FOLFOX 04/11/2016 (oxaliplatin held secondary to thrombocytopenia) 04/21/2016 restaging CTs-difficulty evaluating liver lesions due to hepatic steatosis. Stable right adrenal nodule. No adenopathy or local recurrence near the rectosigmoid anastomotic site. Cycle 7 FOLFOX 04/25/2016 MRI liver 05/02/2016-partial improvement in hepatic metastases Cycle 8 FOLFOX 05/10/2016 Cycle 9 FOLFOX 05/23/2016 (oxaliplatin held due to thrombocytopenia) Cycle 10 FOLFOX 06/06/2016 (oxaliplatin dose reduced due to thrombocytopenia) Cycle 11 FOLFOX 06/27/2016 (oxaliplatin held due to neuropathy) Cycle 12 FOLFOX 07/11/2016 (oxaliplatin held) Initiation of maintenance Xeloda 7 days on/7 days off 07/27/2016 MRI liver 11/18/2016-decrease in hepatic metastatic disease. No new or progressive disease identified within  the abdomen. Continuation of Xeloda 7 days on/7 days off MRI liver 04/27/2017-previous liver lesions not identified, no new lesions, no lymphadenopathy Xeloda continued 7 days on/7 days off MRI liver 12/04/2017 - no evidence of metastatic disease, hepatic steatosis Xeloda continued 7 days on/7 days off MRI liver 07/15/2018- no evidence of metastatic disease.  Stable severe hepatic steatosis. Xeloda continued 7 days on/7 days off MRI liver 03/16/2019-hepatic steatosis, no liver mass, focal area of intrahepatic biliary dilatation in segments 2 and 3 of the left lobe-increased Xeloda continued 7 days on/7 days off MRI abdomen 08/19/2019-no findings to suggest liver metastases.  Bilateral lung nodules measuring up to 2.8 cm, progressive and more conspicuous than on previous exam CT chest 08/29/2019-multiple pulmonary metastases, new from 04/21/2016 Cycle 1 FOLFIRI/bevacizumab 09/09/2019 Cycle 2 FOLFIRI/bevacizumab 09/26/2019  Cycle 3 FOLFIRI/bevacizumab 10/10/2019 Cycle 4 FOLFIRI/bevacizumab 10/24/2019 Cycle 5 FOLFIRI/bevacizumab 11/07/2019 CT chest 11/14/2019-decreased size of lung nodules, no new lesions, hepatic steatosis Cycle 6 FOLFIRI/bevacizumab 11/21/2019 Cycle 7 FOLFIRI/bevacizumab 12/05/2019 Cycle 8 FOLFIRI/bevacizumab 12/19/2019 Cycle 9 FOLFIRI/bevacizumab 01/02/2020 Cycle 10 FOLFIRI/bevacizumab 01/16/2020 CT chest 01/29/2020-stable bilateral pulmonary metastases.  No new or progressive metastatic disease in the chest. Cycle 11 FOLFIRI/bevacizumab 01/30/2020 Cycle 12 FOLFIRI/bevacizumab 02/19/2020 Cycle 13 FOLFIRI/bevacizumab 03/12/2020 Cycle 14 FOLFIRI/bevacizumab 04/02/2020 Cycle 15 FOLFIRI/bevacizumab 04/23/2020 Cycle 16 FOLFIRI/bevacizumab 05/21/2020 CT chest 06/09/2020-mild progression pulmonary metastasis.  Some lesions have increased in size while others are similar. Cycle 1 irinotecan/Panitumumab 06/18/2020 Cycle 2 irinotecan/Panitumumab 07/02/2020 Cycle 3 irinotecan/panitumumab 07/16/2020 Cycle 4  irinotecan/Panitumumab 07/30/2020 Cycle 5 irinotecan/Panitumumab 08/13/2020, Emend added Cycle 6 irinotecan/Panitumumab 08/27/2020 CT chest 09/08/2020-decreased size of pulmonary nodules, no progressive disease Cycle 7 irinotecan/panitumumab 09/02/2020 Cycle 8 irinotecan/panitumumab 09/24/2020 Cycle 9 irinotecan/Panitumumab 10/08/2020 10/22/2020 treatment held due to left foot fracture, need for surgery Cycle 10 irinotecan/panitumumab 11/24/2020 Cycle 11 irinotecan/Panitumumab 12/08/2020 Cycle 12 irinotecan/panitumumab 12/22/2020 Cycle 13 irinotecan/panitumumab 01/05/2021 01/20/2021 CT chest-mixed response with minimal increase in size of some  lesions and minimal decrease in the size of other lesions.  Overall number of lesions is unchanged. Cycle 14 irinotecan/Panitumumab 02/05/2021 Cycle 15 irinotecan/Panitumumab 02/23/2021 Cycle 16 irinotecan/panitumumab 03/16/2021 Cycle 17 irinotecan/Panitumumab 04/13/2021 05/03/2021-CT chest-enlargement of pulmonary metastases, no new lesions 06/21/2021-cycle 1 Lonsurf/bevacizumab 07/19/2021-cycle 2 Lonsurf/bevacizumab 08/16/2021-cycle 3 Lonsurf/bevacizumab 09/09/2021 CT chest-bilateral lung nodules and masses with mixed response, overall stable to very minimally increased 09/13/2021 cycle 4 Lonsurf/bevacizumab 10/12/2021 cycle 5 Lonsurf/bevacizumab 10/21/2021 chest CT-bilateral pulmonary metastases without significant change.  No new or progressive disease within the chest. 11/08/2021 cycle 6 Lonsurf/bevacizumab 12/06/2021 cycle 7 Lonsurf/bevacizumab 01/03/2022 cycle 8 Lonsurf/bevacizumab CTs 01/25/2022-index lung lesion stable to mildly increased in size.  Mild retroperitoneal adenopathy, mildly increased in size compared to 12/16/2020. 02/01/2022 cycle 9 Lonsurf/bevacizumab 02/28/2022 cycle 10 Lonsurf/bevacizumab 03/28/2022 cycle 11 Lonsurf/bevacizumab CTs 04/21/2022-no change in bilateral pulmonary metastases, 2 new hypoattenuating liver lesions, new enlarged portacaval  node Cycle 1 fruquintinib 05/24/2022 Cycle 2 fruquintinib 06/21/2022 Cycle 3 fruquintinib 07/19/2022 CTs 08/12/2022-stable lung nodules, hilar and retroperitoneal nodes, slight increase in size of liver metastases, stable right adrenal nodule Cycle 4 fruquintinib 08/16/2022 Cycle 5 fruquintinib 09/13/2022 Cycle 6 fruquintinib 10/11/2022 CTs 10/22/2022-progressive mediastinal, pericardial, and periportal adenopathy, enlargement of liver lesions, stable pulmonary nodules Cycle 1 FOLFOX 11/14/2022 Cycle 2 FOLFOX 11/29/2022, oxaliplatin dose reduced due to thrombocytopenia Cycle 3 FOLFOX 12/27/2022 Cycle 4 FOLFOX 01/10/2023 Cycle 5 FOLFOX 01/24/2023 Cycle 6 FOLFOX 02/07/2023 CTs 02/14/2023-progressive metastases in the lungs, liver, and chest/retroperitoneal lymph nodes and new peritoneal soft tissue nodules CT chest 03/03/2023-negative for pulmonary embolism, progression of metastatic lymphadenopathy and pleural masses, new trace right pleural effusion, small pericardial effusion CT abdomen/pelvis 03/03/2023-progressive metastatic disease in the liver and abdominal lymph nodes, development of omental caking and new small to moderate volume ascites 03/07/2023-paracentesis, cytology positive for adenocarcinoma CTs 03/14/2023-no change in extensive metastatic disease in the lungs, liver, and lymph nodes.  Omental caking with increased ascites   2.   Rectal bleeding and constipation secondary to #1   3.   History of peripheral vascular disease, status post left lower extremity vascular bypass surgery in April 2017   4.   History of nephrolithiasis   5.   History of Graves' disease treated with radioactive iodine   6.   Anxiety/depression   7.   Hypertension   8.   Hospitalization 01/19/2016 with wound dehiscence status post secondary suture closure of abdominal wall   9.   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 Hyperglycemia 06/20/2016-diagnosed  with diabetes, maintained on insulin   11.  Positive COVID test 12/13/2018; positive COVID test 01/25/2021   12.  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  Admission 03/03/2023 with nausea, fever, and tachycardia 14.  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  Admission 03/14/2023 with altered mental status, hypotension, tachycardia, renal failure and hypoxia V/q. scan 03/15/2023-no evidence of pulmonary embolism, limited study   [name] was admitted with altered mental status, tachycardia, hypotension, and renal failure.  No source for infection has been identified and cultures are negative to date.  He was maintained on anticoagulation therapy prior to hospital admission and a V/q. scan did not reveal evidence of pulmonary embolism.  I suspect the clinical presentation is related to the metastatic tumor burden.  He has extensive metastatic disease involving the lungs, liver, and abdominal cavity.  He has persistent/progressive renal failure.  He may have hepatic encephalopathy despite the normal ammonia level, and  may be developing hepatorenal syndrome.  He underwent a paracentesis yesterday.  The ascitic fluid is exudative secondary to malignancy.  [name] has a poor prognosis.  I recommend hospice care.  I discussed the case with his wife by telephone yesterday.  She understands the poor prognosis.  I estimate his lifespan to be measured in days to weeks.  She agrees with hospice care.  He appears to be a candidate for residential hospice.  I recommend the [facility] home.   Recommendations: 1.  Continue narcotic analgesics for pain, he is on OxyContin for chronic back pain 2.  Refer to [facility] to consider transfer to the [facility] 3.  Continue Keppra, consolidate medical regimen for comfort if he is transferred to the hospice home 4.  Please call oncology as needed.  I am available to assist in  the hospice care as needed.   LOS: 3 days   Thornton Papas, MD   03/17/2023, 9:55 AM

## 2023-03-17 NOTE — Progress Notes (Signed)
Daily Progress Note   Patient Name: Jeff [name]       Date: 03/17/2023 DOB: 02-Feb-1966  Age: 57 y.o. MRN#: [id number] Attending Physician: Lanae Boast, MD Primary Care Physician: Simone Curia, MD Admit Date: 03/14/2023  Reason for Consultation/Follow-up: Establishing goals of care  Subjective:  Complains of back pain, with abdominal distension - ascites, underwent paracentesis on 10-31. Awaiting bed at residential hospice.   Length of Stay: 3  Current Medications: Scheduled Meds:   acetaminophen  1,000 mg Oral Q8H   Chlorhexidine Gluconate Cloth  6 each Topical Q2200   insulin aspart  0-15 Units Subcutaneous TID WC   insulin aspart  0-5 Units Subcutaneous QHS   levETIRAcetam  750 mg Oral BID   levothyroxine  137 mcg Oral QAC breakfast   midodrine  10 mg Oral TID WC   mirtazapine  15 mg Oral QHS   oxyCODONE  10 mg Oral Q12H   pantoprazole  40 mg Oral BID   polyethylene glycol  17 g Oral BID   senna-docusate  1 tablet Oral QHS   venlafaxine XR  225 mg Oral Daily    Continuous Infusions:  ceFEPime (MAXIPIME) IV     metronidazole Stopped (03/17/23 0609)    PRN Meds: benzonatate, HYDROmorphone (DILAUDID) injection, LORazepam, magnesium hydroxide, meclizine, nitroGLYCERIN, ondansetron **OR** ondansetron (ZOFRAN) IV, mouth rinse, tiZANidine, traZODone  Physical Exam         General: Pt is alert, awake,  mild distress - acute back pain  Cardiovascular: RRR, S1/S2 +, no rubs, no gallops Respiratory: CTA bilaterally, no wheezing, no rhonchi Abdominal: abdomen distended.  Extremities: no edema, no cyanosis Vital Signs: BP (!) 105/59 (BP Location: Left Arm)   Pulse (!) 119   Temp 97.7 F (36.5 C)   Resp (!) 21   Ht 5\' 10"  (1.778 m)   Wt 96.2 kg   SpO2 100%   BMI 30.43 kg/m   SpO2: SpO2: 100 % O2 Device: O2 Device: Room Air O2 Flow Rate: O2 Flow Rate (L/min): 3 L/min  Intake/output summary:  Intake/Output Summary (Last 24 hours) at 03/17/2023 1304 Last data filed at 03/17/2023 0648 Gross per 24 hour  Intake 632.91 ml  Output 400 ml  Net 232.91 ml   LBM: Last BM Date : 03/14/23 Baseline Weight: Weight: 96.2 kg Most recent weight:  Weight: 96.2 kg       Palliative Assessment/Data:      Patient Active Problem List   Diagnosis Date Noted   Metabolic acidosis 03/15/2023   AKI (acute kidney injury) (HCC) 03/14/2023   Essential hypertension 03/14/2023   Dyslipidemia 03/14/2023   Hypothyroidism 03/14/2023   Anxiety and depression 03/14/2023   Seizure disorder (HCC) 03/14/2023   Hyponatremia 03/08/2023   Epilepsy (HCC) 03/08/2023   History of CVA (cerebrovascular accident) 03/08/2023   Normocytic anemia 03/08/2023   SIRS (systemic inflammatory response syndrome) (HCC) 03/03/2023   Acute ischemic right MCA stroke (HCC) 10/21/2022   Daytime sleepiness 08/03/2021   OSA (obstructive sleep apnea) 08/03/2021   Body mass index (BMI) 35.0-35.9, adult 01/04/2021   Adhesive capsulitis of left shoulder 06/09/2020   Body mass index (BMI) 25.0-25.9, adult 11/27/2019   DKA (diabetic ketoacidosis) (HCC) 10/25/2019   Seizure (HCC) 10/25/2019   Goals of care, counseling/discussion 09/03/2019   Thoracic spine pain 03/19/2019   DMII (diabetes mellitus, type 2) (HCC) 08/10/2016   Genetic testing 04/20/2016   Port catheter in place 03/14/2016   SBO (small bowel obstruction) (HCC) 01/19/2016   Incisional hernia 01/19/2016   Metastatic colon cancer to liver (HCC) 01/14/2016   Cancer of sigmoid colon (HCC) 12/18/2015   Aftercare following surgery of the circulatory system 09/23/2015   Therapeutic opioid induced constipation 09/23/2015   Left leg claudication (HCC) 09/11/2015   Cervical post-laminectomy syndrome 08/31/2015   Cervical radiculopathy 04/07/2015    Ureterolithiasis 01/04/2015   ED (erectile dysfunction) 12/09/2014   Spasm 07/03/2014   Hypothyroidism following radioiodine therapy 05/26/2014   PAD (peripheral artery disease) (HCC) 02/11/2014   Lumbar facet arthropathy 07/15/2013   Degeneration of lumbar intervertebral disc 06/20/2013   Backache 05/21/2013   Lumbosacral radiculitis 05/21/2013   Chest pain, mid sternal 06/12/2012   Acute non-ST segment elevation myocardial infarction (HCC) 06/11/2012   Hyperlipidemia 06/11/2012   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   Hypertension 03/16/2010    Palliative Care Assessment & Plan   Patient Profile:    Assessment:  57 yom w/ metastatic colon cancer, CKD3a,seizure disorder, anxiety, osteoarthritis, depression, GERD, hypertension, dyslipidemia, hypothyroidism and T2DM who presented to the ED w/ acute onset of generalized weakness with associated dyspnea and significantly diminished appetite with failure to thrive,occasional cough productive and dyspnea,having diminished urine output. He was hypotensive and tachycardic in his oncology office before he was sent to the ER.He has not had any chemotherapy for a while. Patient admitted with altered mental status hypertension tachycardia AKI on CKD and failure to thrive. Patient continues to be hypotensive, with worsening renal function. After subsequent family meeting with palliative care comfort care being pursued at hospice facility and bed available for 03/17/2023    Recommendations/Plan: Continue comfort measures Transfer to residential hospice when bed available.    Goals of Care and Additional Recommendations: Limitations on Scope of Treatment: Full Comfort Care  Code Status:    Code Status Orders  (From admission, onward)           Start     Ordered   03/15/23 1624  Do not attempt resuscitation (DNR)- Limited -Do Not Intubate (DNI)  (Code Status)  Continuous       Question Answer Comment  If pulseless and not breathing  No CPR or chest compressions.   In Pre-Arrest Conditions (Patient Is Breathing and Has A Pulse) Do not intubate. Provide all appropriate non-invasive medical interventions. Avoid ICU transfer unless indicated or required.   Consent: Discussion documented  in EHR or advanced directives reviewed      03/15/23 1625           Code Status History     Date Active Date Inactive Code Status Order ID Comments User Context   03/14/2023 1657 03/15/2023 1625 Full Code [id number]  Arville Care Vernetta Honey, MD Inpatient   03/03/2023 2009 03/09/2023 2055 Full Code [id number]  Maryln Gottron, MD ED   10/21/2022 1342 10/25/2022 2123 Full Code [id number]  Lynnae January, NP ED   10/25/2019 1312 10/26/2019 2059 Full Code [id number]  Ollen Bowl, MD Inpatient   01/19/2016 1217 01/21/2016 1437 Full Code [id number]  Sherrie George, PA-C ED   01/14/2016 1802 01/17/2016 1443 Full Code [id number]  Romie Levee, MD Inpatient   01/04/2015 1923 01/07/2015 1638 Full Code [id number]  Courtney Paris, MD Inpatient   05/28/2014 1245 05/29/2014 1150 Full Code [id number]  Iran Ouch, MD Inpatient   02/19/2014 1024 02/19/2014 2233 Full Code [id number]  Iran Ouch, MD Inpatient   07/15/2013 1936 07/16/2013 1922 Full Code [id number]  Tressie Stalker, MD Inpatient   12/10/2012 2055 12/11/2012 1448 Full Code [id number]  Cristi Loron, MD Inpatient       Prognosis:  < 2 weeks  Discharge Planning: Hospice facility  Care plan was discussed with  wife.   Thank you for allowing the Palliative Medicine Team to assist in the care of this patient. MOD MDM.      Greater than 50%  of this time was spent counseling and coordinating care related to the above assessment and plan.  Rosalin Hawking, MD  Please contact Palliative Medicine Team phone at [phone] for questions and concerns.

## 2023-03-17 NOTE — TOC Transition Note (Signed)
Transition of Care [facility]) - CM/SW Discharge Note   Patient Details  Name: Jeff [name] MRN: [id number] Date of Birth: 1966/03/31  Transition of [facility]) CM/SW Contact:  Amada Jupiter, LCSW Phone Number: 03/17/2023, 1:14 PM   Clinical Narrative:     Bed available at [facility] today and pt is medically cleared for discharge.  Wife aware and agreeable.  PTAR called at 1315.  RN to call report to [phone].  No further TOC needs.  Final next level of care: Hospice Medical Facility Barriers to Discharge: Barriers Resolved   Patient Goals and CMS Choice CMS Medicare.gov Compare Post Acute Care list provided to:: Patient Represenative (must comment) (Patient's wife was at bedside.) Choice offered to / list presented to : Spouse  Discharge Placement                         Discharge Plan and Services Additional resources added to the After Visit Summary for       Post Acute Care Choice: Residential Hospice Bed          DME Arranged: N/A DME Agency: NA                  Social Determinants of Health (SDOH) Interventions SDOH Screenings   Food Insecurity: No Food Insecurity (03/14/2023)  Housing: Low Risk  (03/14/2023)  Transportation Needs: No Transportation Needs (03/14/2023)  Utilities: Not At Risk (03/14/2023)  Tobacco Use: Low Risk  (03/14/2023)     Readmission Risk Interventions    03/16/2023    5:30 PM 03/06/2023   10:47 AM  Readmission Risk Prevention Plan  Transportation Screening Complete Complete  PCP or Specialist Appt within 3-5 Days  Complete  HRI or Home Care Consult  Complete  Social Work Consult for Recovery Care Planning/Counseling  Complete  Palliative Care Screening  Not Applicable  Medication Review Oceanographer) Referral to Pharmacy Complete  PCP or Specialist appointment within 3-5 days of discharge Complete   HRI or Home Care Consult Complete   SW Recovery Care/Counseling Consult Complete   Palliative Care  Screening Complete   Skilled Nursing Facility Not Applicable

## 2023-03-17 NOTE — Progress Notes (Signed)
Report was given to Passenger transport manager at [facility].

## 2023-03-17 NOTE — Progress Notes (Signed)
Pharmacy Antibiotic Note  Jeff [name] is a 57 y.o. male admitted on 03/14/2023 with SIRS.  No source identified. Metastatic burden clearly playing a significant role, may be only active process per provider notes.  Remains on metronidazole 500 mg IV q12h per provider.  Pharmacy remains consulted for Cefepime.  Today, 03/17/23 SCr up to 3.44 and est CrCl 79ml/min  Plan: Adjust Cefepime to 2g IV q24h Pharmacy will sign off consult and continue to monitor renal function for dose adjustments per protocol.   Height: 5\' 10"  (177.8 cm) Weight: 96.2 kg (212 lb 1.3 oz) IBW/kg (Calculated) : 73  Temp (24hrs), Avg:97.5 F (36.4 C), Min:97.2 F (36.2 C), Max:97.9 F (36.6 C)  Recent Labs  Lab 03/14/23 0950 03/14/23 1218 03/14/23 1407 03/14/23 1726 03/14/23 2057 03/14/23 2318 03/15/23 0554 03/15/23 1430 03/16/23 0526 03/17/23 0454  WBC 19.0*  --   --   --   --   --  19.0*  --  18.0* 16.1*  CREATININE 2.80*  --   --   --   --   --  2.62* 2.56* 3.01* 3.44*  LATICACIDVEN  --  5.4* 4.8* 4.1* 3.1* 2.1*  --   --   --   --     Estimated Creatinine Clearance: 27.9 mL/min (A) (by C-G formula based on SCr of 3.44 mg/dL (H)).    Allergies  Allergen Reactions   Hydrocodone Hives    Antimicrobials this admission: 10/29 Vancomycin >> 10/31 10/29 Cefepime >>   Microbiology results: 10/29 BCx: NGTD 10/29 UCx:  mgf 10/29 MRSA PCR: not detected 10/31 peritoneal: pending  Thank you for allowing pharmacy to be a part of this patient's care.  Selinda Eon, PharmD, BCPS Clinical Pharmacist [name] 03/17/2023 10:49 AM

## 2023-03-17 NOTE — Progress Notes (Addendum)
Pt will follow commands regarding ROM/using extremities, but he also exhibits some spastic, non-purposeful movements (all four extremities). He is very weak against gravity, and often has trouble letting go of something he is holding. It takes several attempts to suck water through a straw. Pt can reach for something but cannot sustain--his arms almost instantly drop.

## 2023-03-17 NOTE — Plan of Care (Signed)
  Problem: Education: Goal: Knowledge of General Education information will improve Description: Including pain rating scale, medication(s)/side effects and non-pharmacologic comfort measures Outcome: Progressing   Problem: Health Behavior/Discharge Planning: Goal: Ability to manage health-related needs will improve Outcome: Progressing   Problem: Clinical Measurements: Goal: Ability to maintain clinical measurements within normal limits will improve Outcome: Progressing Goal: Will remain free from infection Outcome: Progressing Goal: Diagnostic test results will improve Outcome: Progressing Goal: Respiratory complications will improve Outcome: Progressing Goal: Cardiovascular complication will be avoided Outcome: Progressing   Problem: Activity: Goal: Risk for activity intolerance will decrease Outcome: Progressing   Problem: Nutrition: Goal: Adequate nutrition will be maintained Outcome: Progressing   Problem: Coping: Goal: Level of anxiety will decrease Outcome: Progressing   Problem: Elimination: Goal: Will not experience complications related to bowel motility Outcome: Progressing Goal: Will not experience complications related to urinary retention Outcome: Progressing   Problem: Pain Management: Goal: General experience of comfort will improve Outcome: Progressing   Problem: Safety: Goal: Ability to remain free from injury will improve Outcome: Progressing   Problem: Skin Integrity: Goal: Risk for impaired skin integrity will decrease Outcome: Progressing   Problem: Fluid Volume: Goal: Hemodynamic stability will improve Outcome: Progressing   Problem: Clinical Measurements: Goal: Diagnostic test results will improve Outcome: Progressing Goal: Signs and symptoms of infection will decrease Outcome: Progressing   Problem: Respiratory: Goal: Ability to maintain adequate ventilation will improve Outcome: Progressing   Problem: Education: Goal:  Ability to describe self-care measures that may prevent or decrease complications (Diabetes Survival Skills Education) will improve Outcome: Progressing Goal: Individualized Educational Video(s) Outcome: Progressing   Problem: Coping: Goal: Ability to adjust to condition or change in health will improve Outcome: Progressing   Problem: Fluid Volume: Goal: Ability to maintain a balanced intake and output will improve Outcome: Progressing   Problem: Health Behavior/Discharge Planning: Goal: Ability to identify and utilize available resources and services will improve Outcome: Progressing Goal: Ability to manage health-related needs will improve Outcome: Progressing   Problem: Metabolic: Goal: Ability to maintain appropriate glucose levels will improve Outcome: Progressing   Problem: Nutritional: Goal: Maintenance of adequate nutrition will improve Outcome: Progressing Goal: Progress toward achieving an optimal weight will improve Outcome: Progressing   Problem: Skin Integrity: Goal: Risk for impaired skin integrity will decrease Outcome: Progressing   Problem: Tissue Perfusion: Goal: Adequacy of tissue perfusion will improve Outcome: Progressing

## 2023-03-17 NOTE — Hospital Course (Addendum)
57 yom w/ metastatic colon cancer, CKD3a,seizure disorder, anxiety, osteoarthritis, depression, GERD, hypertension, dyslipidemia, hypothyroidism and T2DM who presented to the ED w/ acute onset of generalized weakness with associated dyspnea and significantly diminished appetite with failure to thrive,occasional cough productive and dyspnea,having diminished urine output. He was hypotensive and tachycardic in his oncology office before he was sent to the ER.He has not had any chemotherapy for a while. Patient admitted with altered mental status hypertension tachycardia AKI on CKD and failure to thrive. Patient continues to be hypotensive, with worsening renal function. After subsequent family meeting with palliative care comfort care being pursued at hospice facility and bed available for 03/17/2023

## 2023-03-19 ENCOUNTER — Other Ambulatory Visit: Payer: Self-pay | Admitting: Oncology

## 2023-03-19 LAB — CULTURE, BLOOD (ROUTINE X 2)
Culture: NO GROWTH
Culture: NO GROWTH
Special Requests: ADEQUATE

## 2023-03-19 LAB — BODY FLUID CULTURE W GRAM STAIN
Culture: NO GROWTH
Gram Stain: NONE SEEN

## 2023-03-20 ENCOUNTER — Telehealth: Payer: Self-pay | Admitting: *Deleted

## 2023-03-20 NOTE — Telephone Encounter (Signed)
Jeff [name] left message that Jeff [name] died early am on 03/24/23. MD aware.

## 2023-03-21 ENCOUNTER — Inpatient Hospital Stay: Payer: Medicaid Other | Attending: Oncology

## 2023-03-21 ENCOUNTER — Inpatient Hospital Stay: Payer: Medicaid Other

## 2023-03-21 ENCOUNTER — Inpatient Hospital Stay: Payer: Medicaid Other | Admitting: Oncology

## 2023-03-24 ENCOUNTER — Ambulatory Visit: Payer: Medicaid Other | Admitting: Oncology

## 2023-03-24 ENCOUNTER — Other Ambulatory Visit: Payer: Medicaid Other

## 2023-03-29 LAB — PATHOLOGIST SMEAR REVIEW

## 2023-04-05 NOTE — Telephone Encounter (Signed)
Telephone call  

## 2023-04-16 DEATH — deceased

## 2023-09-07 ENCOUNTER — Ambulatory Visit: Payer: Medicaid Other | Admitting: Neurology
